# Patient Record
Sex: Male | Born: 1950
Health system: Southern US, Community
[De-identification: ages and names within clinical notes are randomized; demographics above are authoritative.]

## PROBLEM LIST (undated history)

## (undated) DIAGNOSIS — M25522 Pain in left elbow: Secondary | ICD-10-CM

## (undated) DIAGNOSIS — L309 Dermatitis, unspecified: Secondary | ICD-10-CM

## (undated) DIAGNOSIS — R51 Headache: Secondary | ICD-10-CM

## (undated) DIAGNOSIS — Z0000 Encounter for general adult medical examination without abnormal findings: Secondary | ICD-10-CM

## (undated) DIAGNOSIS — I2510 Atherosclerotic heart disease of native coronary artery without angina pectoris: Secondary | ICD-10-CM

## (undated) DIAGNOSIS — R197 Diarrhea, unspecified: Secondary | ICD-10-CM

## (undated) DIAGNOSIS — K219 Gastro-esophageal reflux disease without esophagitis: Secondary | ICD-10-CM

## (undated) DIAGNOSIS — K7031 Alcoholic cirrhosis of liver with ascites: Secondary | ICD-10-CM

## (undated) DIAGNOSIS — E039 Hypothyroidism, unspecified: Secondary | ICD-10-CM

## (undated) DIAGNOSIS — Z86010 Personal history of colonic polyps: Secondary | ICD-10-CM

## (undated) DIAGNOSIS — I10 Essential (primary) hypertension: Secondary | ICD-10-CM

## (undated) DIAGNOSIS — D61818 Other pancytopenia: Secondary | ICD-10-CM

## (undated) DIAGNOSIS — F1020 Alcohol dependence, uncomplicated: Secondary | ICD-10-CM

## (undated) DIAGNOSIS — I700 Atherosclerosis of aorta: Secondary | ICD-10-CM

## (undated) DIAGNOSIS — G4730 Sleep apnea, unspecified: Secondary | ICD-10-CM

## (undated) DIAGNOSIS — R911 Solitary pulmonary nodule: Secondary | ICD-10-CM

## (undated) DIAGNOSIS — H9190 Unspecified hearing loss, unspecified ear: Secondary | ICD-10-CM

## (undated) DIAGNOSIS — I2789 Other specified pulmonary heart diseases: Secondary | ICD-10-CM

## (undated) DIAGNOSIS — I517 Cardiomegaly: Secondary | ICD-10-CM

## (undated) DIAGNOSIS — H9209 Otalgia, unspecified ear: Secondary | ICD-10-CM

## (undated) DIAGNOSIS — J90 Pleural effusion, not elsewhere classified: Secondary | ICD-10-CM

## (undated) DIAGNOSIS — F419 Anxiety disorder, unspecified: Secondary | ICD-10-CM

## (undated) DIAGNOSIS — R0600 Dyspnea, unspecified: Secondary | ICD-10-CM

## (undated) DIAGNOSIS — F1021 Alcohol dependence, in remission: Secondary | ICD-10-CM

## (undated) DIAGNOSIS — Z973 Presence of spectacles and contact lenses: Secondary | ICD-10-CM

## (undated) DIAGNOSIS — E876 Hypokalemia: Secondary | ICD-10-CM

## (undated) DIAGNOSIS — E781 Pure hyperglyceridemia: Secondary | ICD-10-CM

## (undated) DIAGNOSIS — D696 Thrombocytopenia, unspecified: Secondary | ICD-10-CM

## (undated) DIAGNOSIS — R011 Cardiac murmur, unspecified: Secondary | ICD-10-CM

## (undated) DIAGNOSIS — H5789 Other specified disorders of eye and adnexa: Secondary | ICD-10-CM

## (undated) DIAGNOSIS — H6980 Other specified disorders of Eustachian tube, unspecified ear: Secondary | ICD-10-CM

## (undated) DIAGNOSIS — N289 Disorder of kidney and ureter, unspecified: Secondary | ICD-10-CM

## (undated) DIAGNOSIS — I209 Angina pectoris, unspecified: Secondary | ICD-10-CM

## (undated) DIAGNOSIS — I2721 Secondary pulmonary arterial hypertension: Secondary | ICD-10-CM

## (undated) DIAGNOSIS — J449 Chronic obstructive pulmonary disease, unspecified: Secondary | ICD-10-CM

## (undated) DIAGNOSIS — R42 Dizziness and giddiness: Secondary | ICD-10-CM

## (undated) DIAGNOSIS — R161 Splenomegaly, not elsewhere classified: Secondary | ICD-10-CM

## (undated) DIAGNOSIS — I509 Heart failure, unspecified: Secondary | ICD-10-CM

## (undated) DIAGNOSIS — R569 Unspecified convulsions: Secondary | ICD-10-CM

## (undated) DIAGNOSIS — K625 Hemorrhage of anus and rectum: Secondary | ICD-10-CM

## (undated) DIAGNOSIS — D649 Anemia, unspecified: Secondary | ICD-10-CM

## (undated) HISTORY — DX: Other specified pulmonary heart diseases: I27.89

## (undated) HISTORY — DX: Other specified disorders of eye and adnexa: H57.89

## (undated) HISTORY — DX: Unspecified convulsions: R56.9

## (undated) HISTORY — DX: Gastro-esophageal reflux disease without esophagitis: K21.9

## (undated) HISTORY — DX: Encounter for general adult medical examination without abnormal findings: Z00.00

## (undated) HISTORY — DX: Alcohol dependence, uncomplicated: F10.20

## (undated) HISTORY — DX: Otalgia, unspecified ear: H92.09

## (undated) HISTORY — DX: Dermatitis, unspecified: L30.9

## (undated) HISTORY — DX: Pure hyperglyceridemia: E78.1

## (undated) HISTORY — DX: Alcoholic cirrhosis of liver with ascites: K70.31

## (undated) HISTORY — DX: Personal history of colonic polyps: Z86.010

## (undated) HISTORY — DX: Diarrhea, unspecified: R19.7

## (undated) HISTORY — DX: Essential (primary) hypertension: I10

## (undated) HISTORY — DX: Alcohol dependence, in remission: F10.21

## (undated) HISTORY — DX: Hypokalemia: E87.6

## (undated) HISTORY — DX: Pleural effusion, not elsewhere classified: J90

## (undated) HISTORY — DX: Thrombocytopenia, unspecified: D69.6

## (undated) HISTORY — DX: Pain in left elbow: M25.522

## (undated) HISTORY — DX: Hypothyroidism, unspecified: E03.9

## (undated) HISTORY — DX: Disorder of kidney and ureter, unspecified: N28.9

---
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
## 1997-11-03 HISTORY — PX: UVULOPALATOPHARYNGOPLASTY: SHX827

## 2011-03-01 ENCOUNTER — Emergency Department (HOSPITAL_COMMUNITY)

## 2011-03-01 ENCOUNTER — Emergency Department (HOSPITAL_COMMUNITY)
Admission: EM | Admit: 2011-03-01 | Discharge: 2011-03-01 | Disposition: A | Discharge status: Home / Self Care | Attending: Emergency Medicine | Admitting: Emergency Medicine

## 2011-03-01 DIAGNOSIS — F29 Unspecified psychosis not due to a substance or known physiological condition: Secondary | ICD-10-CM | POA: Insufficient documentation

## 2011-03-01 DIAGNOSIS — I10 Essential (primary) hypertension: Secondary | ICD-10-CM | POA: Insufficient documentation

## 2011-03-01 DIAGNOSIS — Z79899 Other long term (current) drug therapy: Secondary | ICD-10-CM | POA: Insufficient documentation

## 2011-03-01 DIAGNOSIS — R55 Syncope and collapse: Secondary | ICD-10-CM | POA: Insufficient documentation

## 2011-03-01 DIAGNOSIS — R569 Unspecified convulsions: Secondary | ICD-10-CM | POA: Insufficient documentation

## 2011-03-01 LAB — RAPID URINE DRUG SCREEN, HOSP PERFORMED
Amphetamines: NOT DETECTED
Benzodiazepines: NOT DETECTED

## 2011-03-01 LAB — URINALYSIS, ROUTINE W REFLEX MICROSCOPIC
Nitrite: NEGATIVE
Specific Gravity, Urine: 1.02 (ref 1.005–1.030)
Urobilinogen, UA: 1 mg/dL (ref 0.0–1.0)

## 2011-03-01 LAB — CBC
Hemoglobin: 14.1 g/dL (ref 13.0–17.0)
MCH: 38.4 pg — ABNORMAL HIGH (ref 26.0–34.0)
MCHC: 35.4 g/dL (ref 30.0–36.0)

## 2011-03-01 LAB — COMPREHENSIVE METABOLIC PANEL
Albumin: 3.7 g/dL (ref 3.5–5.2)
BUN: 12 mg/dL (ref 6–23)
CO2: 21 mEq/L (ref 19–32)
Chloride: 102 mEq/L (ref 96–112)
Creatinine, Ser: 1.19 mg/dL (ref 0.4–1.5)
GFR calc non Af Amer: >60 mL/min (ref >60)
Total Bilirubin: 1.1 mg/dL (ref 0.3–1.2)

## 2011-03-01 LAB — DIFFERENTIAL
Basophils Relative: 0 % (ref 0–1)
Eosinophils Relative: 1 % (ref 0–5)
Monocytes Absolute: 0.5 10*3/uL (ref 0.1–1.0)
Monocytes Relative: 11 % (ref 3–12)
Neutro Abs: 1.6 10*3/uL — ABNORMAL LOW (ref 1.7–7.7)

## 2011-03-01 LAB — URINE MICROSCOPIC-ADD ON

## 2011-03-01 LAB — ETHANOL: Alcohol, Ethyl (B): 23 mg/dL — ABNORMAL HIGH (ref 0–10)

## 2011-03-01 LAB — POCT CARDIAC MARKERS: Myoglobin, poc: 126 ng/mL (ref 12–200)

## 2011-03-21 ENCOUNTER — Ambulatory Visit (INDEPENDENT_AMBULATORY_CARE_PROVIDER_SITE_OTHER): Admitting: Internal Medicine

## 2011-03-21 ENCOUNTER — Encounter: Payer: Self-pay | Admitting: Internal Medicine

## 2011-03-21 DIAGNOSIS — T2000XA Burn of unspecified degree of head, face, and neck, unspecified site, initial encounter: Secondary | ICD-10-CM

## 2011-03-21 DIAGNOSIS — F1020 Alcohol dependence, uncomplicated: Secondary | ICD-10-CM

## 2011-03-21 DIAGNOSIS — R209 Unspecified disturbances of skin sensation: Secondary | ICD-10-CM

## 2011-03-21 DIAGNOSIS — I10 Essential (primary) hypertension: Secondary | ICD-10-CM

## 2011-03-21 DIAGNOSIS — G40909 Epilepsy, unspecified, not intractable, without status epilepticus: Secondary | ICD-10-CM

## 2011-03-21 MED ORDER — METHYLPREDNISOLONE (PAK) 4 MG PO TABS: ORAL_TABLET | ORAL | Status: AC

## 2011-03-21 MED ORDER — SILVER SULFADIAZINE 1 % EX CREA: TOPICAL_CREAM | CUTANEOUS | Status: DC

## 2011-03-22 ENCOUNTER — Encounter: Payer: Self-pay | Admitting: Internal Medicine

## 2011-03-22 DIAGNOSIS — T2000XA Burn of unspecified degree of head, face, and neck, unspecified site, initial encounter: Secondary | ICD-10-CM | POA: Insufficient documentation

## 2011-03-22 DIAGNOSIS — F1021 Alcohol dependence, in remission: Secondary | ICD-10-CM

## 2011-03-22 DIAGNOSIS — F1020 Alcohol dependence, uncomplicated: Secondary | ICD-10-CM | POA: Insufficient documentation

## 2011-03-22 DIAGNOSIS — I10 Essential (primary) hypertension: Secondary | ICD-10-CM | POA: Insufficient documentation

## 2011-03-22 HISTORY — DX: Alcohol dependence, in remission: F10.21

## 2011-03-22 NOTE — Assessment & Plan Note (Signed)
Counseled regarding need for weaning and ultimate cessation. Reviewed potential morbidity associated with continued alcohol use. Reviewed hospital labs including likely associated decreased platelet count and elevated liver function tests. Consider repeat CBC with B12 folate and liver function tests with next visit.

## 2011-03-22 NOTE — Assessment & Plan Note (Signed)
Currently asymptomatic. Undergoing neurology evaluation. Recently placed on anticonvulsant.Alcohol weaning recommended
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
## 2011-03-22 NOTE — Assessment & Plan Note (Signed)
Begin Silvadene cream to forehead. Medrol Dosepak and cool compresses for edema.  followup in 4 days or sooner if needed.

## 2011-03-22 NOTE — Assessment & Plan Note (Signed)
Average control. Continue current regimen.

## 2011-03-25 ENCOUNTER — Ambulatory Visit (INDEPENDENT_AMBULATORY_CARE_PROVIDER_SITE_OTHER): Admitting: Internal Medicine

## 2011-03-25 VITALS — BP 128/84 | Temp 97.8°F | Wt 214.0 lb

## 2011-03-25 DIAGNOSIS — T2000XA Burn of unspecified degree of head, face, and neck, unspecified site, initial encounter: Secondary | ICD-10-CM

## 2011-03-25 DIAGNOSIS — F1020 Alcohol dependence, uncomplicated: Secondary | ICD-10-CM

## 2011-03-25 DIAGNOSIS — G40909 Epilepsy, unspecified, not intractable, without status epilepticus: Secondary | ICD-10-CM

## 2011-03-25 DIAGNOSIS — G4700 Insomnia, unspecified: Secondary | ICD-10-CM

## 2011-03-25 DIAGNOSIS — R209 Unspecified disturbances of skin sensation: Secondary | ICD-10-CM

## 2011-03-25 DIAGNOSIS — D126 Benign neoplasm of colon, unspecified: Secondary | ICD-10-CM

## 2011-03-25 DIAGNOSIS — K635 Polyp of colon: Secondary | ICD-10-CM

## 2011-03-25 MED ORDER — ESZOPICLONE 2 MG PO TABS: 2.0000 mg | ORAL_TABLET | Freq: Every evening | ORAL | Status: DC | PRN

## 2011-03-26 ENCOUNTER — Encounter: Payer: Self-pay | Admitting: Internal Medicine

## 2011-03-26 DIAGNOSIS — G4700 Insomnia, unspecified: Secondary | ICD-10-CM | POA: Insufficient documentation

## 2011-03-26 DIAGNOSIS — K635 Polyp of colon: Secondary | ICD-10-CM | POA: Insufficient documentation

## 2011-03-26 NOTE — Assessment & Plan Note (Signed)
Patient states history of colon polyps and is apparently due for followup colonoscopy. Has [place] by letter to schedule followup. Asymptomatic. GI referral for followup colonoscopy

## 2011-03-26 NOTE — Assessment & Plan Note (Signed)
Begin Lunesta 2 mg each bedtime when necessary short-term

## 2011-03-26 NOTE — Assessment & Plan Note (Signed)
Again counseled regarding the need for weaning and ultimate cessation of alcohol use. States understanding and agreement. Discussed that next visit with possible physical to obtain B12 level, folate and liver function tests.

## 2011-03-26 NOTE — Assessment & Plan Note (Signed)
Improving. No evidence of secondary infection. Discussed long-term healing,  potential for scarring and difference in pigmentation. Avoid prolonged exposure to sunlight

## 2011-03-26 NOTE — Assessment & Plan Note (Signed)
Asymptomatic without recent seizure activity. Advised to contact neurologist office at soonest opportunity to discuss possible anticonvulsant side effects.
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
## 2011-04-02 ENCOUNTER — Other Ambulatory Visit

## 2011-04-11 ENCOUNTER — Encounter: Admitting: Internal Medicine

## 2011-04-15 ENCOUNTER — Other Ambulatory Visit

## 2011-04-18 ENCOUNTER — Encounter

## 2011-04-22 ENCOUNTER — Encounter: Admitting: Internal Medicine

## 2011-05-02 ENCOUNTER — Other Ambulatory Visit: Admitting: Gastroenterology

## 2011-08-18 ENCOUNTER — Telehealth: Payer: Self-pay | Admitting: Internal Medicine

## 2011-08-18 DIAGNOSIS — Z0000 Encounter for general adult medical examination without abnormal findings: Secondary | ICD-10-CM

## 2011-08-18 NOTE — Telephone Encounter (Signed)
Lab orders have been entered for [facility] for October 2012.

## 2011-08-18 NOTE — Telephone Encounter (Signed)
Patient has cpe with Dr. Elizebeth Koller on 08/28/11. Please order labs. Patient will be going downstairs to [facility] on 08/21/11

## 2011-08-28 ENCOUNTER — Encounter: Payer: Self-pay | Admitting: Internal Medicine

## 2011-08-28 ENCOUNTER — Ambulatory Visit (INDEPENDENT_AMBULATORY_CARE_PROVIDER_SITE_OTHER): Admitting: Internal Medicine

## 2011-08-28 DIAGNOSIS — D126 Benign neoplasm of colon, unspecified: Secondary | ICD-10-CM

## 2011-08-28 DIAGNOSIS — Z125 Encounter for screening for malignant neoplasm of prostate: Secondary | ICD-10-CM

## 2011-08-28 DIAGNOSIS — D7589 Other specified diseases of blood and blood-forming organs: Secondary | ICD-10-CM

## 2011-08-28 DIAGNOSIS — Z1211 Encounter for screening for malignant neoplasm of colon: Secondary | ICD-10-CM

## 2011-08-28 DIAGNOSIS — R1310 Dysphagia, unspecified: Secondary | ICD-10-CM

## 2011-08-28 DIAGNOSIS — G40909 Epilepsy, unspecified, not intractable, without status epilepticus: Secondary | ICD-10-CM

## 2011-08-28 DIAGNOSIS — Z79899 Other long term (current) drug therapy: Secondary | ICD-10-CM

## 2011-08-28 DIAGNOSIS — Z23 Encounter for immunization: Secondary | ICD-10-CM

## 2011-08-28 DIAGNOSIS — H6120 Impacted cerumen, unspecified ear: Secondary | ICD-10-CM

## 2011-08-28 DIAGNOSIS — E876 Hypokalemia: Secondary | ICD-10-CM

## 2011-08-28 DIAGNOSIS — K635 Polyp of colon: Secondary | ICD-10-CM

## 2011-08-28 DIAGNOSIS — I10 Essential (primary) hypertension: Secondary | ICD-10-CM

## 2011-08-28 MED ORDER — VERAPAMIL HCL 180 MG PO TBCR: 180.0000 mg | EXTENDED_RELEASE_TABLET | Freq: Two times a day (BID) | ORAL | Status: DC

## 2011-08-28 MED ORDER — LISINOPRIL 10 MG PO TABS: 10.0000 mg | ORAL_TABLET | Freq: Every day | ORAL | Status: DC

## 2011-08-28 MED ORDER — OMEPRAZOLE 40 MG PO CPDR: 40.0000 mg | DELAYED_RELEASE_CAPSULE | Freq: Every day | ORAL | Status: DC

## 2011-08-28 MED ORDER — ALLOPURINOL 100 MG PO TABS: 100.0000 mg | ORAL_TABLET | Freq: Every day | ORAL | Status: DC

## 2011-08-28 MED ORDER — POTASSIUM CHLORIDE 20 MEQ/15ML (10%) PO LIQD: 20.0000 meq | Freq: Two times a day (BID) | ORAL | Status: DC

## 2011-08-29 DIAGNOSIS — H6120 Impacted cerumen, unspecified ear: Secondary | ICD-10-CM | POA: Insufficient documentation

## 2011-08-29 DIAGNOSIS — R1310 Dysphagia, unspecified: Secondary | ICD-10-CM | POA: Insufficient documentation

## 2011-08-29 DIAGNOSIS — D7589 Other specified diseases of blood and blood-forming organs: Secondary | ICD-10-CM | POA: Insufficient documentation

## 2011-08-29 DIAGNOSIS — E876 Hypokalemia: Secondary | ICD-10-CM | POA: Insufficient documentation

## 2011-08-29 NOTE — Assessment & Plan Note (Signed)
Attempted irrigation only partially successful. Recommend outpt use of debrox gtts at home for several days. If unsuccessful return for re-evaluation

## 2011-08-29 NOTE — Progress Notes (Signed)
  Subjective:    Patient ID: Darren Mueller, male    DOB: [birth year], 60 y.o.   MRN: [id number]  HPI Pt presents to clinic for followup of multiple medical problems. BP reviewed minimally elevated. Last visit was referred for follow up colonoscopy but cancelled appt. Takes kdur and finds the pill difficult to swallow. Also notes generalized dysphagia for several months for solid foods but not liquids.  Some decreased hearing from left ear. Sees neurology for h/o sz d/o and notes side effects from anticonvulsent. Has decreased etoh use to periodic. No other complaints.   reports that he has quit smoking. He has never used smokeless tobacco. He reports that he drinks alcohol. He reports that he does not use illicit drugs. family history is not on file. Allergies  Allergen Reactions  . Aspirin Other (See Comments)    hypertension     Review of Systems see hpi     Objective:   Physical Exam  Nursing note and vitals reviewed. Constitutional: He appears well-developed and well-nourished. No distress.  HENT:  Head: Normocephalic and atraumatic.  Right Ear: External ear normal.  Nose: Nose normal.  Mouth/Throat: Oropharynx is clear and moist. No oropharyngeal exudate.       Left ear canal occluded with cerumen otherwise nl  Eyes: Conjunctivae are normal. No scleral icterus.  Neck: Neck supple.  Cardiovascular: Normal rate, regular rhythm and normal heart sounds.  Exam reveals no gallop and no friction rub.   No murmur heard. Pulmonary/Chest: Effort normal and breath sounds normal. No respiratory distress. He has no wheezes. He has no rales.  Neurological: He is alert.  Skin: Skin is warm and dry. He is not diaphoretic.  Psychiatric: He has a normal mood and affect.          Assessment & Plan:

## 2011-08-29 NOTE — Assessment & Plan Note (Signed)
Minimally suboptimal. Continue current regimen but recommend outpt bp log to be submitted for review. Obtain cbc, chem7 and tsh

## 2011-08-29 NOTE — Assessment & Plan Note (Signed)
Reschedule colonoscopy

## 2011-08-29 NOTE — Assessment & Plan Note (Signed)
Obtain b12 and folate

## 2011-08-29 NOTE — Assessment & Plan Note (Signed)
Encouraged follow up with neurology to discuss side effects of medication

## 2011-08-29 NOTE — Assessment & Plan Note (Signed)
Change kdur to liquid. Obtain chem7

## 2011-08-29 NOTE — Assessment & Plan Note (Signed)
Begin omeprazole 50m qd. If sx's do not resolve within ~4wks consider gi consult for consideration of egd

## 2011-09-04 ENCOUNTER — Encounter

## 2011-09-05 ENCOUNTER — Ambulatory Visit (INDEPENDENT_AMBULATORY_CARE_PROVIDER_SITE_OTHER): Admitting: Gastroenterology

## 2011-09-05 ENCOUNTER — Encounter: Payer: Self-pay | Admitting: Gastroenterology

## 2011-09-05 VITALS — BP 152/84 | HR 62 | Ht 73.0 in | Wt 219.0 lb

## 2011-09-05 DIAGNOSIS — R1310 Dysphagia, unspecified: Secondary | ICD-10-CM

## 2011-09-05 DIAGNOSIS — Z86010 Personal history of colonic polyps: Secondary | ICD-10-CM

## 2011-09-05 DIAGNOSIS — K219 Gastro-esophageal reflux disease without esophagitis: Secondary | ICD-10-CM

## 2011-09-05 NOTE — Progress Notes (Signed)
HPI: This is a   very pleasant 60 year old man who I am meeting for the first time today.  Had colonoscopy 1-2 years ago, many polyps taken out (maybe 10 polyp, all removed per patient).  Was told to have repeat in 6 months, hasn't yet.  Recently moved to [place] from [place].  He also has swallowing difficulty.  Some large pills (potassium), meat at times will lodge, requiring regurgitation.  Overall he has gained weight   Retired Curator.  He gets "indigestion" burning in chest with certain meals.  Just started PPI last week and that helps his dysphagia, and burning.  No overt bleeding.    Review of systems: Pertinent positive and negative review of systems were noted in the above HPI section. Complete review of systems was performed and was otherwise normal.    Past Medical History  Diagnosis Date  . Hypertension   . Seizures   . Hx of colonic polyps   . GERD (gastroesophageal reflux disease)   . Gout     No past surgical history on file.  Current Outpatient Prescriptions  Medication Sig Dispense Refill  . allopurinol (ZYLOPRIM) 100 MG tablet Take 1 tablet (100 mg total) by mouth daily.  90 tablet  2  . lacosamide (VIMPAT) 50 MG TABS Take 50 mg by mouth. Takes as directed       . lisinopril (PRINIVIL,ZESTRIL) 10 MG tablet Take 1 tablet (10 mg total) by mouth daily.  90 tablet  2  . omeprazole (PRILOSEC) 40 MG capsule Take 1 capsule (40 mg total) by mouth daily.  30 capsule  1  . potassium chloride 20 MEQ/15ML (10%) solution Take 15 mLs (20 mEq total) by mouth 2 (two) times daily.  900 mL  6  . verapamil (CALAN-SR) 180 MG CR tablet Take 1 tablet (180 mg total) by mouth 2 (two) times daily.  180 tablet  2    Allergies as of 09/05/2011 - Review Complete 09/05/2011  Allergen Reaction Noted  . Aspirin Other (See Comments) 03/21/2011    Family History  Problem Relation Age of Onset  . Heart disease Mother   . Prostate cancer Father   . Colon cancer Neg Hx      History   Social History  . Marital Status: Widowed    Spouse Name: N/A    Number of Children: 3  . Years of Education: N/A   Occupational History  . Retired    Social History Main Topics  . Smoking status: Former Research scientist (life sciences)  . Smokeless tobacco: Never Used  . Alcohol Use: Yes     3-4 cans of beers and 4-5 drinks of vodka  . Drug Use: No  . Sexually Active: Not on file   Other Topics Concern  . Not on file   Social History Narrative   0 caffeine drinks daily        Physical Exam: BP 152/84  Pulse 62  Ht _0  (1.854 m)  Wt 219 lb (99.338 kg)  BMI 28.89 kg/m2 Constitutional: generally well-appearing Psychiatric: alert and oriented x3 Eyes: extraocular movements intact Mouth: oral pharynx moist, no lesions Neck: supple no lymphadenopathy Cardiovascular: heart regular rate and rhythm Lungs: clear to auscultation bilaterally Abdomen: soft, nontender, nondistended, no obvious ascites, no peritoneal signs, normal bowel sounds Extremities: no lower extremity edema bilaterally Skin: no lesions on visible extremities    Assessment and plan: 60 y.o. male with  history of colon polyps, solid intermittent food dysphagia, GERD  We will  get records from his colonoscopy in [place] and we'll schedule him for repeat colonoscopy as appropriate. He has GERD, chronically, this seems to be improving with once daily proton pump inhibitor. He has been given a GERD handout to discuss modifying some lifestyle factors that can contribute. Given his chronic GERD, intermittent dysphagia we will proceed with EGD at the same time as his likely colonoscopy. I see no reason for any further blood tests or imaging studies prior to that.

## 2011-09-05 NOTE — Patient Instructions (Addendum)
GERD handout.  For you especially,  Peppermint, caffeine, alcohol can contribute to GERD. We will get records from your [place] colonoscopy from 1-2 years ago (colonscopy and any associated pathology reports).  Will plan to schedule a repeat colonoscopy after reviewing that information. You will be set up for an upper endoscopy (will wait on the above info before scheduling). Continue prilosec once daily. A copy of this information will be made available to Dr. Elizebeth Koller.  Gastroesophageal reflux disease (GERD) happens when acid from your stomach flows up into the esophagus. When acid comes in contact with the esophagus, the acid causes soreness (inflammation) in the esophagus. Over time, GERD may create small holes (ulcers) in the lining of the esophagus. CAUSES   Increased body weight. This puts pressure on the stomach, making acid rise from the stomach into the esophagus.   Smoking. This increases acid production in the stomach.   Drinking alcohol. This causes decreased pressure in the lower esophageal sphincter (valve or ring of muscle between the esophagus and stomach), allowing acid from the stomach into the esophagus.   Late evening meals and a full stomach. This increases pressure and acid production in the stomach.   A malformed lower esophageal sphincter.  Sometimes, no cause is found. SYMPTOMS   Burning pain in the lower part of the mid-chest behind the breastbone and in the mid-stomach area. This may occur twice a week or more often.   Trouble swallowing.   Sore throat.   Dry cough.   Asthma-like symptoms including chest tightness, shortness of breath, or wheezing.  DIAGNOSIS  Your caregiver may be able to diagnose GERD based on your symptoms. In some cases, X-rays and other tests may be done to check for complications or to check the condition of your stomach and esophagus. TREATMENT  Your caregiver may recommend over-the-counter or prescription medicines to help decrease  acid production. Ask your caregiver before starting or adding any new medicines.  HOME CARE INSTRUCTIONS   Change the factors that you can control. Ask your caregiver for guidance concerning weight loss, quitting smoking, and alcohol consumption.   Avoid foods and drinks that make your symptoms worse, such as:   Caffeine or alcoholic drinks.   Chocolate.   Peppermint or mint flavorings.   Garlic and onions.   Spicy foods.   Citrus fruits, such as oranges, lemons, or limes.   Tomato-based foods such as sauce, chili, salsa, and pizza.   Fried and fatty foods.   Avoid lying down for the 3 hours prior to your bedtime or prior to taking a nap.   Eat small, frequent meals instead of large meals.   Wear loose-fitting clothing. Do not wear anything tight around your waist that causes pressure on your stomach.   Raise the head of your bed 6 to 8 inches with wood blocks to help you sleep. Extra pillows will not help.   Only take over-the-counter or prescription medicines for pain, discomfort, or fever as directed by your caregiver.   Do not take aspirin, ibuprofen, or other nonsteroidal anti-inflammatory drugs (NSAIDs).  SEEK IMMEDIATE MEDICAL CARE IF:   You have pain in your arms, neck, jaw, teeth, or back.   Your pain increases or changes in intensity or duration.   You develop nausea, vomiting, or sweating (diaphoresis).   You develop shortness of breath, or you faint.   Your vomit is green, yellow, black, or looks like coffee grounds or blood.   Your stool is red, bloody, or black.  These symptoms could be signs of other problems, such as heart disease, gastric bleeding, or esophageal bleeding. MAKE SURE YOU:   Understand these instructions.   Will watch your condition.   Will get help right away if you are not doing well or get worse.  Document Released: 07/30/2005 Document Revised: 07/02/2011 Document Reviewed: 05/09/2011 [facility] Patient Information 2012  [place].

## 2011-09-09 ENCOUNTER — Telehealth: Payer: Self-pay | Admitting: Gastroenterology

## 2011-09-09 NOTE — Telephone Encounter (Signed)
Left message on machine to call back  

## 2011-09-10 ENCOUNTER — Telehealth: Payer: Self-pay | Admitting: Gastroenterology

## 2011-09-10 NOTE — Telephone Encounter (Signed)
Records received see alternate note

## 2011-09-10 NOTE — Telephone Encounter (Signed)
Colonoscopy 12/10: "Two sessile polyps were found in hepatic flexure (8-40m), piecemeal resected and then tatoo'd with INigerInk" path showed "moderate to severe dysplasia" and he was recommended to have repeat examination at 3 month interval.  Please call him.  [place]

## 2011-09-11 NOTE — Telephone Encounter (Signed)
Pt transferred to [facility] to schedule colon endo

## 2011-09-12 ENCOUNTER — Other Ambulatory Visit: Admitting: Gastroenterology

## 2011-09-16 ENCOUNTER — Encounter: Payer: Self-pay | Admitting: Gastroenterology

## 2011-09-22 ENCOUNTER — Ambulatory Visit (AMBULATORY_SURGERY_CENTER): Admitting: *Deleted

## 2011-09-22 VITALS — Ht 73.0 in | Wt 221.3 lb

## 2011-09-22 DIAGNOSIS — R1310 Dysphagia, unspecified: Secondary | ICD-10-CM

## 2011-09-22 DIAGNOSIS — K219 Gastro-esophageal reflux disease without esophagitis: Secondary | ICD-10-CM

## 2011-09-22 DIAGNOSIS — Z1211 Encounter for screening for malignant neoplasm of colon: Secondary | ICD-10-CM

## 2011-09-22 MED ORDER — PEG-KCL-NACL-NASULF-NA ASC-C 100 G PO SOLR: ORAL | Status: DC

## 2011-09-30 ENCOUNTER — Telehealth: Payer: Self-pay | Admitting: Internal Medicine

## 2011-09-30 MED ORDER — OMEPRAZOLE 40 MG PO CPDR: 40.0000 mg | DELAYED_RELEASE_CAPSULE | Freq: Every day | ORAL | Status: DC

## 2011-09-30 NOTE — Telephone Encounter (Signed)
Refill-omeprazole dr 40 capsule. Take one capsule (46m total) by mouth daily. Qty 30 last fill 11.21.12

## 2011-09-30 NOTE — Telephone Encounter (Signed)
Rx refill sent to pharmacy.

## 2011-10-03 ENCOUNTER — Ambulatory Visit (AMBULATORY_SURGERY_CENTER): Admitting: Gastroenterology

## 2011-10-03 ENCOUNTER — Encounter: Payer: Self-pay | Admitting: Gastroenterology

## 2011-10-03 VITALS — BP 158/77 | HR 92 | Temp 98.7°F | Resp 12 | Ht 73.0 in | Wt 221.0 lb

## 2011-10-03 DIAGNOSIS — R1310 Dysphagia, unspecified: Secondary | ICD-10-CM

## 2011-10-03 DIAGNOSIS — D126 Benign neoplasm of colon, unspecified: Secondary | ICD-10-CM

## 2011-10-03 DIAGNOSIS — Z1211 Encounter for screening for malignant neoplasm of colon: Secondary | ICD-10-CM

## 2011-10-03 DIAGNOSIS — I8500 Esophageal varices without bleeding: Secondary | ICD-10-CM

## 2011-10-03 DIAGNOSIS — K219 Gastro-esophageal reflux disease without esophagitis: Secondary | ICD-10-CM

## 2011-10-03 DIAGNOSIS — Z86010 Personal history of colonic polyps: Secondary | ICD-10-CM

## 2011-10-03 MED ORDER — SODIUM CHLORIDE 0.9 % IV SOLN: 500.0000 mL | INTRAVENOUS | Status: DC

## 2011-10-03 NOTE — Patient Instructions (Signed)
Continue Prilosec EVERYDAY.  Take 30 minutes before 1st meal of the day.  Dr Ardis Hughs office will schedule Ultrasound to check your liver [phone])  4polyps-all removed today.  Dr Ardis Hughs will send you a letter in about 2weeks to explain pathology of polyps.
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
## 2011-10-06 ENCOUNTER — Telehealth: Payer: Self-pay | Admitting: *Deleted

## 2011-10-06 ENCOUNTER — Telehealth: Payer: Self-pay

## 2011-10-06 DIAGNOSIS — R933 Abnormal findings on diagnostic imaging of other parts of digestive tract: Secondary | ICD-10-CM

## 2011-10-06 NOTE — Telephone Encounter (Signed)
No answer at # given.  Message left on voicemail.

## 2011-10-06 NOTE — Telephone Encounter (Signed)
Pt aware and ROV scheduled

## 2011-10-06 NOTE — Telephone Encounter (Addendum)
[place] appt  830 am 10/08/11 arrive 15 minutes early and npo after midnight Left message on machine to call back

## 2011-10-08 ENCOUNTER — Ambulatory Visit (HOSPITAL_COMMUNITY)
Admission: RE | Admit: 2011-10-08 | Discharge: 2011-10-08 | Disposition: A | Discharge status: Home / Self Care | Source: Ambulatory Visit | Attending: Gastroenterology | Admitting: Gastroenterology

## 2011-10-08 DIAGNOSIS — R933 Abnormal findings on diagnostic imaging of other parts of digestive tract: Secondary | ICD-10-CM

## 2011-10-08 DIAGNOSIS — K829 Disease of gallbladder, unspecified: Secondary | ICD-10-CM | POA: Insufficient documentation

## 2011-10-10 ENCOUNTER — Other Ambulatory Visit (INDEPENDENT_AMBULATORY_CARE_PROVIDER_SITE_OTHER)

## 2011-10-10 ENCOUNTER — Ambulatory Visit (INDEPENDENT_AMBULATORY_CARE_PROVIDER_SITE_OTHER): Admitting: Gastroenterology

## 2011-10-10 ENCOUNTER — Encounter: Payer: Self-pay | Admitting: Gastroenterology

## 2011-10-10 VITALS — BP 142/84 | HR 88 | Ht 73.0 in | Wt 217.8 lb

## 2011-10-10 DIAGNOSIS — I8500 Esophageal varices without bleeding: Secondary | ICD-10-CM

## 2011-10-10 LAB — CBC WITH DIFFERENTIAL/PLATELET
Basophils Absolute: 0 10*3/uL (ref 0.0–0.1)
Eosinophils Absolute: 0.1 10*3/uL (ref 0.0–0.7)
HCT: 43.3 % (ref 39.0–52.0)
Lymphs Abs: 1.7 10*3/uL (ref 0.7–4.0)
MCV: 107 fl — ABNORMAL HIGH (ref 78.0–100.0)
Monocytes Absolute: 0.5 10*3/uL (ref 0.1–1.0)
Platelets: 82 10*3/uL — ABNORMAL LOW (ref 150.0–400.0)
RDW: 13.4 % (ref 11.5–14.6)

## 2011-10-10 LAB — COMPREHENSIVE METABOLIC PANEL
ALT: 40 U/L (ref 0–53)
Alkaline Phosphatase: 156 U/L — ABNORMAL HIGH (ref 39–117)
CO2: 25 mEq/L (ref 19–32)
GFR: 65.57 mL/min (ref >60.00)
Sodium: 139 mEq/L (ref 135–145)
Total Bilirubin: 1.2 mg/dL (ref 0.3–1.2)
Total Protein: 8.5 g/dL — ABNORMAL HIGH (ref 6.0–8.3)

## 2011-10-10 LAB — PROTIME-INR: Prothrombin Time: 12.7 s — ABNORMAL HIGH (ref 10.2–12.4)

## 2011-10-10 LAB — IBC PANEL: Saturation Ratios: 43.5 % (ref 20.0–50.0)

## 2011-10-10 LAB — IGA: IgA: 601 mg/dL — ABNORMAL HIGH (ref 68–378)

## 2011-10-10 NOTE — Progress Notes (Signed)
Review of pertinent gastrointestinal problems: 1. history of adenomatous colon polyps : Colonoscopy 12/10: "Two sessile polyps were found in hepatic flexure (8-26m), piecemeal resected and then tatoo'd with INigerInk" path showed "moderate to severe dysplasia" and he was recommended to have repeat examination at 3 month interval.  Colonoscopy December 2012 found 4 polyps, one was piecemeal removed, adenoma. He was recommended to have repeat at 6 month recall. 2. incidentally noted esophageal varices on EGD done for dysphasia.  Abdominal ultrasound showed a normal liver without clear sign of cirrhosis   HPI: This is a very pleasant 60year old man whom I last saw the time of his upper and lower endoscopy. I was surprised to find incidental distal esophagus varices as well as portal gastropathy. We arranged this visit to discuss those findings. In the interim he has had an abdominal ultrasound that showed no clear cirrhosis.  He drinks 3-5 alcoholic beverages a day sometimes more sometimes less.    Past Medical History  Diagnosis Date  . Hypertension   . Seizures   . Hx of colonic polyps   . GERD (gastroesophageal reflux disease)   . Gout     No past surgical history on file.  Current Outpatient Prescriptions  Medication Sig Dispense Refill  . allopurinol (ZYLOPRIM) 100 MG tablet Take 1 tablet (100 mg total) by mouth daily.  90 tablet  2  . lacosamide (VIMPAT) 50 MG TABS Take 50 mg by mouth. Takes as directed       . lisinopril (PRINIVIL,ZESTRIL) 10 MG tablet Take 1 tablet (10 mg total) by mouth daily.  90 tablet  2  . omeprazole (PRILOSEC) 40 MG capsule Take 1 capsule (40 mg total) by mouth daily.  30 capsule  6  . potassium chloride 40 MEQ/15ML (20%) LIQD Take 20 mEq by mouth 2 times daily at 12 noon and 4 pm.        . verapamil (CALAN-SR) 180 MG CR tablet Take 1 tablet (180 mg total) by mouth 2 (two) times daily.  180 tablet  2   Current Facility-Administered Medications    Medication Dose Route Frequency Provider Last Rate Last Dose  . DISCONTD: 0.9 %  sodium chloride infusion  500 mL Intravenous Continuous DOwens Loffler MD        Allergies as of 10/10/2011 - Review Complete 10/10/2011  Allergen Reaction Noted  . Aspirin Other (See Comments) 03/21/2011    Family History  Problem Relation Age of Onset  . Heart disease Mother   . Prostate cancer Father   . Colon cancer Neg Hx     History   Social History  . Marital Status: Widowed    Spouse Name: N/A    Number of Children: 3  . Years of Education: N/A   Occupational History  . Retired    Social History Main Topics  . Smoking status: Former Smoker    Quit date: 09/22/1979  . Smokeless tobacco: Never Used  . Alcohol Use: Yes     3-4 cans of beers and 4-5 drinks of vodka  . Drug Use: No  . Sexually Active: Not on file   Other Topics Concern  . Not on file   Social History Narrative   0 caffeine drinks daily       Physical Exam: BP 142/84  Pulse 88  Ht _0  (1.854 m)  Wt 217 lb 12.8 oz (98.793 kg)  BMI 28.74 kg/m2 Constitutional: generally well-appearing Psychiatric: alert and oriented x3 Abdomen: soft, nontender, nondistended,  no obvious ascites, no peritoneal signs, normal bowel sounds No lower extremity edema Anicteric sclera    Assessment and plan: 60 y.o. male with adenomatous polyps, incidentally noted esophageal varices  I suspect he has alcoholic liver disease. I cannot clearly say that he has cirrhosis however. I reviewed some labs done several months ago showing AST of 50 or 60, normal ALT. His platelets were 100,000. He is going to get a repeat set of blood work today, see those tests summarized below. I made it very clear to him that I think probably alcohol is causing this problem and that he should cut back as best as he can. He is a very intelligent, pleasant man. He will return to see me in 2 months to touch base.

## 2011-10-10 NOTE — Patient Instructions (Signed)
You will get labs drawn today:  Hepatitis A (IgM and IgG), Hepatitis B surface antigen, Hepatitis B surface antibody, Hepatitis C antibody, total iron, ferritin, TIBC, TTG, total IgA level, CBC, CMET, INR. You need to cut back on your alcohol consumption, it has clearly irritated your liver (cannot definitely say if you have cirrhosis or not).  Return to see Dr. Ardis Hughs in 2 months.

## 2011-10-11 LAB — HEPATITIS A ANTIBODY, TOTAL: Hep A Total Ab: POSITIVE — AB

## 2011-10-11 LAB — HEPATITIS B SURFACE ANTIBODY,QUALITATIVE: Hep B S Ab: NEGATIVE

## 2011-10-15 ENCOUNTER — Telehealth: Payer: Self-pay | Admitting: Gastroenterology

## 2011-10-15 NOTE — Telephone Encounter (Signed)
See result note dated 10/15/11

## 2011-10-23 ENCOUNTER — Telehealth: Payer: Self-pay | Admitting: Internal Medicine

## 2011-10-23 NOTE — Telephone Encounter (Signed)
Call placed to [facility] pharmacy at [phone] ;spoke with Larkin Ina.  He was informed a Rx was sent in for Omeprazole in November. He has verified Rx on file for patient. There are no refills needed at this time.

## 2011-10-23 NOTE — Telephone Encounter (Signed)
Refill- omeprazole dr 7m capsule. Take one capsule (447mtotal) by mouth daily. Qty 30 last fill 11.21.12

## 2011-10-31 ENCOUNTER — Other Ambulatory Visit: Payer: Self-pay | Admitting: Internal Medicine

## 2011-10-31 NOTE — Telephone Encounter (Signed)
Received refill request again for omeprazole. Spoke to [place] at the pharmacy and he states that pt has refills on file and he will contact pt re: refill request.

## 2011-11-26 ENCOUNTER — Ambulatory Visit: Admitting: Internal Medicine

## 2011-12-03 ENCOUNTER — Telehealth: Payer: Self-pay | Admitting: *Deleted

## 2011-12-03 ENCOUNTER — Ambulatory Visit: Admitting: Internal Medicine

## 2011-12-03 MED ORDER — VERAPAMIL HCL ER 180 MG PO CP24: 180.0000 mg | ORAL_CAPSULE | Freq: Two times a day (BID) | ORAL | Status: DC

## 2011-12-03 NOTE — Telephone Encounter (Signed)
Pt called stating he has always gotten capsule form of Verapamil in the past. Recent rx was in tablet form and pt cancelled rx from mail order. Pt is requesting that we send rx for capsules. The only capsule form I could see in EPIC is for verelan 24hr. Pt is supposed to be taking 1 twice a day. Is this ok to change to capsule form?

## 2011-12-03 NOTE — Telephone Encounter (Signed)
Rx refill sent to pharmacy per patient request, per Dr Elizebeth Koller ok.

## 2011-12-03 NOTE — Telephone Encounter (Signed)
ok 

## 2011-12-08 ENCOUNTER — Encounter: Payer: Self-pay | Admitting: Gastroenterology

## 2012-01-22 ENCOUNTER — Telehealth: Payer: Self-pay | Admitting: Internal Medicine

## 2012-01-22 MED ORDER — POTASSIUM CHLORIDE 40 MEQ/15ML (20%) PO LIQD: 20.0000 meq | Freq: Two times a day (BID) | ORAL | Status: DC

## 2012-01-22 MED ORDER — DOXEPIN HCL 6 MG PO TABS: 6.0000 mg | ORAL_TABLET | Freq: Every day | ORAL | Status: DC

## 2012-01-22 MED ORDER — OMEPRAZOLE 40 MG PO CPDR: 40.0000 mg | DELAYED_RELEASE_CAPSULE | Freq: Every day | ORAL | Status: DC

## 2012-01-22 NOTE — Telephone Encounter (Signed)
Omeprazole dr 40 mg capsule. 90 days supply  silenor 37m tablet. 90 days supply.   Potassium cl 10% (20MEQ/15ML). 90 days supply.

## 2012-01-22 NOTE — Telephone Encounter (Signed)
Call placed to patient at [phone], no answer. A detailed voice message was left for patient to return phone call regarding medication refills for [facility]. Patient is past due for fasting blood work that was scheduled in October, and a follow up appointment is needed with Dr Elizebeth Koller.

## 2012-01-22 NOTE — Telephone Encounter (Signed)
Patient returned phone call and left voice message stating he was returning phone call,and he could be reached at [phone]. Call returned to patient at requested phone number, no answer. Voice recording reached stating the mailbox is full and can not accept messages at this time, please try your call again later.

## 2012-01-22 NOTE — Telephone Encounter (Signed)
Patient returned phone call he,was asked to confirm if he is taking Silenor. He states that he is taking that for sleep. He was informed medication would be sent to pharmacy, and advised to schedule a follow up with Dr Elizebeth Koller within 3 months. Patient has verbalized understanding and agrees as instructed.  Rx refills sent to pharmacy.

## 2012-01-27 ENCOUNTER — Encounter: Payer: Self-pay | Admitting: Gastroenterology

## 2012-01-27 ENCOUNTER — Telehealth: Payer: Self-pay | Admitting: Internal Medicine

## 2012-01-27 NOTE — Telephone Encounter (Signed)
Patient last seen in October of 2012. Appointment for January was cancelled; there are no future appointments on file.

## 2012-01-27 NOTE — Telephone Encounter (Signed)
Don't viagra on med list or med history. Certainly can talk about at next appt

## 2012-01-27 NOTE — Telephone Encounter (Signed)
Rx refill denial sent to [facility]; office visit needed.

## 2012-01-27 NOTE — Telephone Encounter (Signed)
New prescription request for Viagra  Viagra 2m tablet. 90 days supply.

## 2012-01-28 ENCOUNTER — Telehealth: Payer: Self-pay | Admitting: Internal Medicine

## 2012-01-28 MED ORDER — VERAPAMIL HCL ER 180 MG PO CP24: 180.0000 mg | ORAL_CAPSULE | Freq: Two times a day (BID) | ORAL | Status: DC

## 2012-01-28 NOTE — Telephone Encounter (Signed)
Rx refill sent to pharmacy. Patient due for office visit

## 2012-01-28 NOTE — Telephone Encounter (Signed)
New prescription request  Verapamil ER 162m capsule.

## 2012-05-11 ENCOUNTER — Encounter: Payer: Self-pay | Admitting: *Deleted

## 2012-05-11 ENCOUNTER — Telehealth: Payer: Self-pay | Admitting: Internal Medicine

## 2012-05-11 MED ORDER — DOXEPIN HCL 6 MG PO TABS: 6.0000 mg | ORAL_TABLET | Freq: Every day | ORAL | Status: DC

## 2012-05-11 MED ORDER — OMEPRAZOLE 40 MG PO CPDR: 40.0000 mg | DELAYED_RELEASE_CAPSULE | Freq: Every day | ORAL | Status: DC

## 2012-05-11 MED ORDER — VERAPAMIL HCL ER 180 MG PO CP24: 180.0000 mg | ORAL_CAPSULE | Freq: Two times a day (BID) | ORAL | Status: DC

## 2012-05-11 MED ORDER — ALLOPURINOL 100 MG PO TABS: 100.0000 mg | ORAL_TABLET | Freq: Every day | ORAL | Status: DC

## 2012-05-11 NOTE — Telephone Encounter (Signed)
New prescription request  Omeprazole dr 75m capsule. 90 days supply. Refills 4  silenor 688mtablet.  90 days supply.  Allopurinol 10011mablet.  90 days supply.  Verapamil er 180m87mpsule.  90 days supply. Refills 4

## 2012-05-11 NOTE — Telephone Encounter (Signed)
Last OV 08-28-11, Allopurinol last filled 08-28-11 #90 2, last filled silenor 01-22-12 #90.Please advise

## 2012-05-11 NOTE — Telephone Encounter (Signed)
[place] for 90d supplies. Needs f/u appt. Last seen 08/2011

## 2012-05-11 NOTE — Telephone Encounter (Signed)
Rx sent, Letter Mail

## 2012-07-10 ENCOUNTER — Other Ambulatory Visit: Payer: Self-pay | Admitting: Internal Medicine

## 2012-07-12 NOTE — Telephone Encounter (Signed)
Rx[s] request; pt last OV 10.25.12 [due back in 01.2013]: Silenor: Hx in EMR, D/C 11.02.12; Omeprazole: last Rx 07.09.13 #90x0 Letter mailed 07.09.13 informing pt follow-up OV needed for medication refills/SLS DENIAL ON REQUEST.

## 2012-07-31 ENCOUNTER — Other Ambulatory Visit: Payer: Self-pay | Admitting: Internal Medicine

## 2012-08-03 NOTE — Telephone Encounter (Signed)
Pt was due for follow up in January. Refill of allopurinol was sent to pharmacy in July with note that pt was due for office visit. No follow up has been scheduled. Spoke with pt and notified him of need for follow up. Appt scheduled for 08/30/12 at 10:00am with Dr Elizebeth Koller. Pt verified that he still uses [facility] mail order. Refill sent, #90 x no refills.

## 2012-08-24 ENCOUNTER — Encounter: Payer: Self-pay | Admitting: Gastroenterology

## 2012-08-30 ENCOUNTER — Ambulatory Visit: Admitting: Internal Medicine

## 2012-10-03 ENCOUNTER — Other Ambulatory Visit: Payer: Self-pay | Admitting: Internal Medicine

## 2012-10-04 NOTE — Telephone Encounter (Signed)
DENIED-Pt last OV 10.25.12; pt was notified via Letter that appointment was needed when given refill authorizations 07.09.13, proceeded to [facility] 10.28.13 OV]/SLS

## 2012-11-01 ENCOUNTER — Ambulatory Visit (INDEPENDENT_AMBULATORY_CARE_PROVIDER_SITE_OTHER): Admitting: Internal Medicine

## 2012-11-01 ENCOUNTER — Encounter: Payer: Self-pay | Admitting: Internal Medicine

## 2012-11-01 VITALS — BP 128/92 | HR 88 | Temp 97.6°F | Resp 16 | Ht 72.0 in | Wt 200.0 lb

## 2012-11-01 DIAGNOSIS — J40 Bronchitis, not specified as acute or chronic: Secondary | ICD-10-CM

## 2012-11-01 DIAGNOSIS — IMO0001 Reserved for inherently not codable concepts without codable children: Secondary | ICD-10-CM | POA: Insufficient documentation

## 2012-11-01 DIAGNOSIS — I10 Essential (primary) hypertension: Secondary | ICD-10-CM

## 2012-11-01 DIAGNOSIS — Z125 Encounter for screening for malignant neoplasm of prostate: Secondary | ICD-10-CM

## 2012-11-01 DIAGNOSIS — F1020 Alcohol dependence, uncomplicated: Secondary | ICD-10-CM

## 2012-11-01 DIAGNOSIS — Z79899 Other long term (current) drug therapy: Secondary | ICD-10-CM

## 2012-11-01 LAB — TSH: TSH: 4.192 u[IU]/mL (ref 0.350–4.500)

## 2012-11-01 MED ORDER — OMEPRAZOLE 40 MG PO CPDR: 40.0000 mg | DELAYED_RELEASE_CAPSULE | Freq: Every day | ORAL | Status: DC

## 2012-11-01 MED ORDER — AZITHROMYCIN 250 MG PO TABS: ORAL_TABLET | ORAL | Status: AC

## 2012-11-01 NOTE — Progress Notes (Signed)
  Subjective:    Patient ID: Darren Mueller, male    DOB: 1951/05/28, 61 y.o.   MRN: [id number]  HPI Pt presents to clinic for followup of multiple medical problems. Last visit 08/2011. Notes 1.5 wk h/o nasal congestion and productive cough. Not taking any medication for the problem. Weight down 13 lbs since last year unintentionally. H/o seizure d/o and self dc'ed anticonvulsant ~3 months ago due to side effects. Denies any seizure activity. Aware of potential for further sz's and has not followed up with neurology. Continues to drink etoh regularly ~ 2-3 drinks a day five days a week on average. Denies shakes, tremors or legal problems related to etoh. Has had egd in the past demonstrating esophageal varices and subsequent abd [place] did not demonstrate obvious evidence of cirrhosis.   Past Medical History  Diagnosis Date  . Hypertension   . Seizures   . Hx of colonic polyps   . GERD (gastroesophageal reflux disease)   . Gout    No past surgical history on file.  reports that he quit smoking about 33 years ago. He has never used smokeless tobacco. He reports that he drinks alcohol. He reports that he does not use illicit drugs. family history includes Heart disease in his mother and Prostate cancer in his father.  There is no history of Colon cancer. Allergies  Allergen Reactions  . Aspirin Other (See Comments)    hypertension      Review of Systems see hpi     Objective:   Physical Exam  Physical Exam  Nursing note and vitals reviewed. Constitutional: Appears well-developed and well-nourished. No distress.  HENT:  Head: Normocephalic and atraumatic.  Right Ear: External ear normal.  Left Ear: External ear normal.  Eyes: Conjunctivae are normal. No scleral icterus.  Neck: Neck supple. Carotid bruit is not present.  Cardiovascular: Normal rate, regular rhythm and normal heart sounds.  Exam reveals no gallop and no friction rub.   No murmur heard. Pulmonary/Chest: Effort normal  and breath sounds normal. No respiratory distress. He has no wheezes. no rales.  Lymphadenopathy:    He has no cervical adenopathy.  Neurological:Alert.  Skin: Skin is warm and dry. Not diaphoretic.  Psychiatric: Has a normal mood and affect.        Assessment & Plan:

## 2012-11-01 NOTE — Assessment & Plan Note (Signed)
Begin abx tx. Followup if no improvement or worsening.

## 2012-11-01 NOTE — Assessment & Plan Note (Addendum)
Long discussion held regarding the need for weaning and ultimate cessation. Aware of potential contribution to HTN as well as potential damage to liver. States understanding. Obtain LFT

## 2012-11-01 NOTE — Assessment & Plan Note (Signed)
Mildly elevated but also with recent uri. Recommend continuation of current regimen and outpt monitoring of bp.

## 2012-11-02 LAB — CBC WITH DIFFERENTIAL/PLATELET
Basophils Absolute: 0 10*3/uL (ref 0.0–0.1)
Basophils Relative: 1 % (ref 0–1)
HCT: 43.4 % (ref 39.0–52.0)
Hemoglobin: 14.6 g/dL (ref 13.0–17.0)
Lymphocytes Relative: 53 % — ABNORMAL HIGH (ref 12–46)
MCHC: 33.6 g/dL (ref 30.0–36.0)
Monocytes Absolute: 0.6 10*3/uL (ref 0.1–1.0)
Monocytes Relative: 15 % — ABNORMAL HIGH (ref 3–12)
Neutro Abs: 1.2 10*3/uL — ABNORMAL LOW (ref 1.7–7.7)
Neutrophils Relative %: 29 % — ABNORMAL LOW (ref 43–77)
WBC: 4 10*3/uL (ref 4.0–10.5)

## 2012-11-02 LAB — HEPATIC FUNCTION PANEL
ALT: 24 U/L (ref 0–53)
AST: 46 U/L — ABNORMAL HIGH (ref 0–37)
Alkaline Phosphatase: 204 U/L — ABNORMAL HIGH (ref 39–117)
Bilirubin, Direct: 0.6 mg/dL — ABNORMAL HIGH (ref 0.0–0.3)
Total Bilirubin: 1.6 mg/dL — ABNORMAL HIGH (ref 0.3–1.2)

## 2012-11-02 LAB — BASIC METABOLIC PANEL
Chloride: 102 mEq/L (ref 96–112)
Glucose, Bld: 82 mg/dL (ref 70–99)
Potassium: 3.8 mEq/L (ref 3.5–5.3)
Sodium: 141 mEq/L (ref 135–145)

## 2012-11-02 LAB — LIPID PANEL
Total CHOL/HDL Ratio: 2.8 Ratio
VLDL: 18 mg/dL (ref 0–40)

## 2012-11-02 LAB — PSA: PSA: 0.49 ng/mL (ref <=4.00)

## 2012-11-03 DIAGNOSIS — R911 Solitary pulmonary nodule: Secondary | ICD-10-CM

## 2012-11-03 DIAGNOSIS — J90 Pleural effusion, not elsewhere classified: Secondary | ICD-10-CM

## 2012-11-03 DIAGNOSIS — K7031 Alcoholic cirrhosis of liver with ascites: Secondary | ICD-10-CM

## 2012-11-03 HISTORY — DX: Alcoholic cirrhosis of liver with ascites: K70.31

## 2012-11-03 HISTORY — DX: Pleural effusion, not elsewhere classified: J90

## 2012-11-03 HISTORY — DX: Solitary pulmonary nodule: R91.1

## 2012-11-05 ENCOUNTER — Telehealth: Payer: Self-pay | Admitting: *Deleted

## 2012-11-05 DIAGNOSIS — R945 Abnormal results of liver function studies: Secondary | ICD-10-CM

## 2012-11-05 NOTE — Telephone Encounter (Signed)
Patient informed, understood & agreed; future lab orders placed/SLS

## 2012-11-05 NOTE — Telephone Encounter (Signed)
Message copied by Rockwell [place] on Fri Nov 05, 2012  5:03 PM ------      Message from: Burnice Logan      Created: Wed Nov 03, 2012 12:30 PM       Liver tests high and rising. Stop/wean alcohol to off as discussed in clinic. Repeat lft in one month dx abn lft.

## 2012-11-09 ENCOUNTER — Telehealth: Payer: Self-pay | Admitting: Internal Medicine

## 2012-11-09 MED ORDER — LISINOPRIL 10 MG PO TABS: 10.0000 mg | ORAL_TABLET | Freq: Every day | ORAL | Status: DC

## 2012-11-09 NOTE — Telephone Encounter (Signed)
Pt needs re-evaluation in the office please. Could be pneumonia.

## 2012-11-09 NOTE — Telephone Encounter (Signed)
Notified pt and he scheduled appt for tomorrow at 9:15am. Per verbal from Provider, recommended Delsym OTC for cough until he is seen in the office. Lisinopril refill sent to pharmacy.

## 2012-11-09 NOTE — Telephone Encounter (Signed)
Pt reports he was seen in the office on 11/01/12 and diagnosed with Bronchitis.  Pt states he completed the Zpak as prescribed but cough is not better, it is worse.  Pt is requesting medication for cough because he is unable to sleep. Pt denies any fever or any symptoms beside cough.    Pt is also requesting a refill of Lisinopril.  Pt uses [facility] off [address].

## 2012-11-09 NOTE — Telephone Encounter (Signed)
Please advise.

## 2012-11-10 ENCOUNTER — Encounter: Payer: Self-pay | Admitting: Internal Medicine

## 2012-11-10 ENCOUNTER — Ambulatory Visit (HOSPITAL_BASED_OUTPATIENT_CLINIC_OR_DEPARTMENT_OTHER)
Admission: RE | Admit: 2012-11-10 | Discharge: 2012-11-10 | Disposition: A | Discharge status: Home / Self Care | Source: Ambulatory Visit | Attending: Internal Medicine | Admitting: Internal Medicine

## 2012-11-10 ENCOUNTER — Ambulatory Visit (INDEPENDENT_AMBULATORY_CARE_PROVIDER_SITE_OTHER): Admitting: Internal Medicine

## 2012-11-10 VITALS — BP 130/92 | HR 96 | Temp 97.6°F | Resp 16 | Wt 195.8 lb

## 2012-11-10 DIAGNOSIS — R05 Cough: Secondary | ICD-10-CM

## 2012-11-10 DIAGNOSIS — J40 Bronchitis, not specified as acute or chronic: Secondary | ICD-10-CM

## 2012-11-10 DIAGNOSIS — R059 Cough, unspecified: Secondary | ICD-10-CM | POA: Insufficient documentation

## 2012-11-10 DIAGNOSIS — J90 Pleural effusion, not elsewhere classified: Secondary | ICD-10-CM | POA: Insufficient documentation

## 2012-11-10 MED ORDER — HYDROCOD POLST-CHLORPHEN POLST 10-8 MG/5ML PO LQCR: 5.0000 mL | Freq: Two times a day (BID) | ORAL | Status: DC | PRN

## 2012-11-10 MED ORDER — LEVOFLOXACIN 500 MG PO TABS: 500.0000 mg | ORAL_TABLET | Freq: Every day | ORAL | Status: DC

## 2012-11-10 NOTE — Progress Notes (Signed)
  Subjective:    Patient ID: Darren Mueller, male    DOB: November 18, 1950, 62 y.o.   MRN: [id number]  HPI Pt presents to clinic for evaluation of persistent cough. Recently seen in f/u and noted cough. tx'ed with zpak without improvement. Now notes cough productive for green sputum without blood. Has intermittent dizziness without presyncope or syncope. Denies f/c, dyspnea or wheezing. Reviewed elevated LFT with strong recommendation to wean etoh use to off and repeat LFT in a few weeks.   Past Medical History  Diagnosis Date  . Hypertension   . Seizures   . Hx of colonic polyps   . GERD (gastroesophageal reflux disease)   . Gout    No past surgical history on file.  reports that he quit smoking about 33 years ago. He has never used smokeless tobacco. He reports that he drinks alcohol. He reports that he does not use illicit drugs. family history includes Heart disease in his mother and Prostate cancer in his father.  There is no history of Colon cancer. Allergies  Allergen Reactions  . Aspirin Other (See Comments)    hypertension     Review of Systems see hpi     Objective:   Physical Exam  Nursing note and vitals reviewed. Constitutional: He appears well-developed and well-nourished. No distress.  HENT:  Head: Normocephalic and atraumatic.  Eyes: Conjunctivae normal are normal. No scleral icterus.  Neck: Neck supple.  Cardiovascular: Normal rate, regular rhythm and normal heart sounds.   Pulmonary/Chest: Effort normal and breath sounds normal. No respiratory distress. He has no wheezes. He has no rales.  Neurological: He is alert.  Skin: Skin is warm and dry. He is not diaphoretic.  Psychiatric: He has a normal mood and affect.          Assessment & Plan:

## 2012-11-10 NOTE — Assessment & Plan Note (Signed)
Persistent sx's s/p abx course. Obtain cxr pa/lat. Begin levaquin x 7days. Attempt tussionex prn-cautioned re possible sedating effect. Followup if no improvement or worsening.

## 2012-11-23 ENCOUNTER — Telehealth: Payer: Self-pay | Admitting: Internal Medicine

## 2012-11-23 NOTE — Telephone Encounter (Signed)
Sometimes a persistent cough is partially reflux, recommend he start Rantidine/Zantac 150 mg po qhs (OTC) if no improvement in a week then needs to come in for repeat imaging since he did have a small pleural effusion secondary to his infection. Return sooner if symptoms are actually worsening

## 2012-11-23 NOTE — Telephone Encounter (Signed)
Pt informed and voiced understanding

## 2012-11-23 NOTE — Telephone Encounter (Signed)
Patient states that he was seen on 11/10/12 for a cough. He says that his cough has not gotten any better since this visit and would like to know what Dr. Charlett Blake recommends he do?

## 2012-11-23 NOTE — Telephone Encounter (Signed)
Please advise?

## 2012-11-23 NOTE — Telephone Encounter (Signed)
Patient also left me a message stating that his cough is wheezy, still a little tired, and ears "clogged" up

## 2012-11-25 ENCOUNTER — Telehealth: Payer: Self-pay

## 2012-11-25 ENCOUNTER — Telehealth: Payer: Self-pay | Admitting: Internal Medicine

## 2012-11-25 NOTE — Telephone Encounter (Signed)
Pt states that his cough is getting worse, ears are clogged, and when he got up to go to the bathroom he got short of breath. I informed pt that the MD is out of the office until Monday and I would send the message to [facility] to get an answer tomorrow but if he is having sob he needs to go to ED. Pt stated that the sob was from laying on the couch for 3 days and he's better now. I informed pt that someone would get back with him tomorrow but if the sob continues he needs to go to ED. Pt voiced understanding and said he was just concerned about his cough getting worse. Please advise?

## 2012-11-25 NOTE — Telephone Encounter (Signed)
Pt would like to switch to Dr todd. Dr Sherren Mocha sees his dad. Can I sch?

## 2012-11-26 ENCOUNTER — Ambulatory Visit (INDEPENDENT_AMBULATORY_CARE_PROVIDER_SITE_OTHER): Admitting: Family

## 2012-11-26 ENCOUNTER — Ambulatory Visit (HOSPITAL_BASED_OUTPATIENT_CLINIC_OR_DEPARTMENT_OTHER)
Admission: RE | Admit: 2012-11-26 | Discharge: 2012-11-26 | Disposition: A | Discharge status: Home / Self Care | Source: Ambulatory Visit | Attending: Family | Admitting: Family

## 2012-11-26 ENCOUNTER — Telehealth: Payer: Self-pay | Admitting: Family

## 2012-11-26 ENCOUNTER — Encounter: Payer: Self-pay | Admitting: Family

## 2012-11-26 VITALS — BP 142/100 | HR 96 | Temp 97.4°F | Resp 16 | Wt 191.1 lb

## 2012-11-26 DIAGNOSIS — R059 Cough, unspecified: Secondary | ICD-10-CM | POA: Insufficient documentation

## 2012-11-26 DIAGNOSIS — R05 Cough: Secondary | ICD-10-CM | POA: Insufficient documentation

## 2012-11-26 DIAGNOSIS — R0602 Shortness of breath: Secondary | ICD-10-CM

## 2012-11-26 DIAGNOSIS — K7460 Unspecified cirrhosis of liver: Secondary | ICD-10-CM | POA: Insufficient documentation

## 2012-11-26 DIAGNOSIS — R945 Abnormal results of liver function studies: Secondary | ICD-10-CM

## 2012-11-26 DIAGNOSIS — F1020 Alcohol dependence, uncomplicated: Secondary | ICD-10-CM

## 2012-11-26 DIAGNOSIS — R7989 Other specified abnormal findings of blood chemistry: Secondary | ICD-10-CM

## 2012-11-26 DIAGNOSIS — J90 Pleural effusion, not elsewhere classified: Secondary | ICD-10-CM | POA: Insufficient documentation

## 2012-11-26 DIAGNOSIS — E8770 Fluid overload, unspecified: Secondary | ICD-10-CM

## 2012-11-26 LAB — HEPATIC FUNCTION PANEL
Albumin: 3.9 g/dL (ref 3.5–5.2)
Indirect Bilirubin: 1.1 mg/dL — ABNORMAL HIGH (ref 0.0–0.9)
Total Protein: 7.1 g/dL (ref 6.0–8.3)

## 2012-11-26 LAB — CBC WITH DIFFERENTIAL/PLATELET
Basophils Absolute: 0 10*3/uL (ref 0.0–0.1)
Eosinophils Relative: 2 % (ref 0–5)
Lymphocytes Relative: 33 % (ref 12–46)
MCV: 102.1 fL — ABNORMAL HIGH (ref 78.0–100.0)
Platelets: 84 10*3/uL — ABNORMAL LOW (ref 150–400)
RDW: 14.5 % (ref 11.5–15.5)
WBC: 3.1 10*3/uL — ABNORMAL LOW (ref 4.0–10.5)

## 2012-11-26 MED ORDER — FUROSEMIDE 40 MG PO TABS: 40.0000 mg | ORAL_TABLET | Freq: Every day | ORAL | Status: DC

## 2012-11-26 NOTE — Telephone Encounter (Signed)
Pls call patient and let him know that I would like to see him in the office-  I have an opening today at 4pm.  If symptoms severe, he should go to the ED instead,

## 2012-11-26 NOTE — Patient Instructions (Addendum)
Please complete your blood work prior to leaving. Complete chest x-ray on the first floor. Follow up in 1 week.  Go to ER if you develop worsening shortness of breath, chest pain, or fever >101.

## 2012-11-26 NOTE — Assessment & Plan Note (Signed)
Cough has worsened.  On lung exam, significant diminished breath sounds on right. Suspect enlarging RLL effusion.  Will obtain follow up chest xray.  Further recommendations pending cxr results.

## 2012-11-26 NOTE — Assessment & Plan Note (Signed)
Will repeat LFT today.

## 2012-11-26 NOTE — Telephone Encounter (Signed)
Pt reports that he did not go to the ER last night as his shortness of breath improved. Reports that he took Robitussin and symptoms seem better today. Scheduled pt appt with [place] today at 4pm for further evaluation and advised pt if sob returns and becomes severe that he should be evaluated in the ER.

## 2012-11-26 NOTE — Assessment & Plan Note (Signed)
He reports that he has not drank alcohol since starting his abx.  I applauded him for this.

## 2012-11-26 NOTE — Telephone Encounter (Signed)
Reviewed chest x ray. Plan lasix, 2decho, follow up in 1 week. Pt notified, verbalizes understanding.

## 2012-11-26 NOTE — Progress Notes (Signed)
Subjective:    Patient ID: Darren Mueller, male    DOB: 02-May-1951, 62 y.o.   MRN: [id number]  HPI  Ms. Zeimet is a 62 yr old male who presents today with chief complaint of shortness of breath.  Chart is reviewed. He was initially seen on 12/30 and diagnosed with bronchitis. He was treated at that time with zithromax.  Symptoms did not improve. He was seen back in the office on 1/8 and a cxr was performed which noted small right pleural effusion.  He was given rx for levaquin x 7 days.  He completed Rx for levaquin.  He reports that shortness of breath started on Wednesday and is worse with exertion. SOB is not made worse by laying flat. He denies associated fever. He does report associated cough which is productive at times of green phlegm.  He reports clear nasal drainage.  He also reports some difficulty hearing out of the right ear.   Review of Systems See HPI  Past Medical History  Diagnosis Date  . Hypertension   . Seizures   . Hx of colonic polyps   . GERD (gastroesophageal reflux disease)   . Gout     History   Social History  . Marital Status: Widowed    Spouse Name: N/A    Number of Children: 3  . Years of Education: N/A   Occupational History  . Retired    Social History Main Topics  . Smoking status: Former Smoker    Quit date: 09/22/1979  . Smokeless tobacco: Never Used  . Alcohol Use: Yes     Comment: 3-4 cans of beers and 4-5 drinks of vodka  . Drug Use: No  . Sexually Active: Not on file   Other Topics Concern  . Not on file   Social History Narrative   0 caffeine drinks daily     No past surgical history on file.  Family History  Problem Relation Age of Onset  . Heart disease Mother   . Prostate cancer Father   . Colon cancer Neg Hx     Allergies  Allergen Reactions  . Aspirin Other (See Comments)    hypertension    Current Outpatient Prescriptions on File Prior to Visit  Medication Sig Dispense Refill  . allopurinol (ZYLOPRIM) 100  MG tablet TAKE 1 TABLET DAILY (OFFICE VISIT DUE)  90 tablet  0  . chlorpheniramine-HYDROcodone (TUSSIONEX PENNKINETIC ER) 10-8 MG/5ML LQCR Take 5 mLs by mouth every 12 (twelve) hours as needed.  140 mL  0  . lacosamide (VIMPAT) 50 MG TABS Take 50 mg by mouth. Takes as directed       . lisinopril (PRINIVIL,ZESTRIL) 10 MG tablet Take 1 tablet (10 mg total) by mouth daily.  90 tablet  1  . omeprazole (PRILOSEC) 40 MG capsule Take 1 capsule (40 mg total) by mouth daily.  90 capsule  3  . potassium chloride 40 MEQ/15ML (20%) LIQD Take 7.5 mLs (20 mEq total) by mouth 2 times daily at 12 noon and 4 pm.  900 mL  0  . verapamil (VERELAN) 180 MG 24 hr capsule Take 1 capsule (180 mg total) by mouth 2 (two) times daily.  180 capsule  0  . levofloxacin (LEVAQUIN) 500 MG tablet Take 1 tablet (500 mg total) by mouth daily.  7 tablet  0    BP 142/100  Pulse 96  Temp 97.4 F (36.3 C) (Oral)  Resp 16  Wt 191 lb 1.3 oz (86.673 kg)  SpO2 94%       Objective:   Physical Exam  Constitutional: He is oriented to person, place, and time. He appears well-developed and well-nourished. No distress.       Cough noted.  HENT:  Head: Normocephalic and atraumatic.  Right Ear: Tympanic membrane and ear canal normal.  Left Ear: Tympanic membrane and ear canal normal.  Mouth/Throat: No oropharyngeal exudate, posterior oropharyngeal edema or posterior oropharyngeal erythema.  Eyes: Scleral icterus is present.  Cardiovascular: Normal rate and regular rhythm.   No murmur heard. Pulmonary/Chest: Effort normal. No respiratory distress. He has decreased breath sounds in the right lower field. He has no wheezes. He has no rhonchi. He has no rales.  Musculoskeletal: He exhibits no edema.  Neurological: He is alert and oriented to person, place, and time.  Skin: Skin is warm and dry.  Psychiatric: He has a normal mood and affect. His behavior is normal. Judgment and thought content normal.          Assessment &  Plan:

## 2012-11-28 ENCOUNTER — Telehealth: Payer: Self-pay | Admitting: Family

## 2012-11-28 DIAGNOSIS — R945 Abnormal results of liver function studies: Secondary | ICD-10-CM

## 2012-11-28 NOTE — Telephone Encounter (Signed)
Pls call pt and let him know that his liver function testing looks slightly worse than last draw.  I would like to arrange a follow up appointment with Dr. Ardis Hughs.  Pended below.

## 2012-11-29 ENCOUNTER — Telehealth: Payer: Self-pay | Admitting: Internal Medicine

## 2012-11-29 NOTE — Telephone Encounter (Signed)
Patient states that he was seen last Friday for a cough. He says that he has been taking Robitussen and his cough is now worse. He would like to know what Melissa recommends he do?

## 2012-11-29 NOTE — Telephone Encounter (Signed)
Pt is sch for 11-30-2012

## 2012-11-29 NOTE — Telephone Encounter (Signed)
Notified pt, he is agreeable to proceed with referral.

## 2012-11-29 NOTE — Telephone Encounter (Signed)
Pt states he has increased post nasal drip, productive cough with green phlegm, having trouble sleeping at night. Pt is still taking lasix. He is out of tussionex.  Please advise.

## 2012-11-29 NOTE — Telephone Encounter (Signed)
ok 

## 2012-11-30 ENCOUNTER — Ambulatory Visit

## 2012-11-30 ENCOUNTER — Encounter: Payer: Self-pay | Admitting: Family Medicine

## 2012-11-30 ENCOUNTER — Ambulatory Visit (INDEPENDENT_AMBULATORY_CARE_PROVIDER_SITE_OTHER)
Admission: RE | Admit: 2012-11-30 | Discharge: 2012-11-30 | Disposition: A | Discharge status: Home / Self Care | Source: Ambulatory Visit | Attending: Critical Care Medicine | Admitting: Critical Care Medicine

## 2012-11-30 ENCOUNTER — Ambulatory Visit (INDEPENDENT_AMBULATORY_CARE_PROVIDER_SITE_OTHER): Admitting: Critical Care Medicine

## 2012-11-30 ENCOUNTER — Encounter: Payer: Self-pay | Admitting: Critical Care Medicine

## 2012-11-30 ENCOUNTER — Ambulatory Visit (INDEPENDENT_AMBULATORY_CARE_PROVIDER_SITE_OTHER): Admitting: Family Medicine

## 2012-11-30 VITALS — BP 140/90 | HR 89 | Temp 97.6°F | Ht 73.0 in | Wt 188.2 lb

## 2012-11-30 VITALS — BP 150/98 | Temp 97.4°F | Wt 188.0 lb

## 2012-11-30 DIAGNOSIS — J90 Pleural effusion, not elsewhere classified: Secondary | ICD-10-CM

## 2012-11-30 DIAGNOSIS — J40 Bronchitis, not specified as acute or chronic: Secondary | ICD-10-CM

## 2012-11-30 DIAGNOSIS — R7989 Other specified abnormal findings of blood chemistry: Secondary | ICD-10-CM

## 2012-11-30 DIAGNOSIS — R945 Abnormal results of liver function studies: Secondary | ICD-10-CM

## 2012-11-30 DIAGNOSIS — R05 Cough: Secondary | ICD-10-CM

## 2012-11-30 DIAGNOSIS — K7030 Alcoholic cirrhosis of liver without ascites: Secondary | ICD-10-CM

## 2012-11-30 DIAGNOSIS — J9811 Atelectasis: Secondary | ICD-10-CM

## 2012-11-30 DIAGNOSIS — J9819 Other pulmonary collapse: Secondary | ICD-10-CM

## 2012-11-30 DIAGNOSIS — K7010 Alcoholic hepatitis without ascites: Secondary | ICD-10-CM

## 2012-11-30 DIAGNOSIS — R059 Cough, unspecified: Secondary | ICD-10-CM

## 2012-11-30 LAB — PROTIME-INR: INR: 1.2 (ref <1.50)

## 2012-11-30 MED ORDER — HYDROCODONE-HOMATROPINE 5-1.5 MG/5ML PO SYRP: 5.0000 mL | ORAL_SOLUTION | Freq: Four times a day (QID) | ORAL | Status: DC | PRN

## 2012-11-30 MED ORDER — LOSARTAN POTASSIUM 50 MG PO TABS: 50.0000 mg | ORAL_TABLET | Freq: Every day | ORAL | Status: DC

## 2012-11-30 MED ORDER — FLUTICASONE PROPIONATE 50 MCG/ACT NA SUSP: 2.0000 | Freq: Every day | NASAL | Status: DC

## 2012-11-30 MED ORDER — HYDROCOD POLST-CHLORPHEN POLST 10-8 MG/5ML PO LQCR: 5.0000 mL | Freq: Two times a day (BID) | ORAL | Status: DC | PRN

## 2012-11-30 NOTE — Patient Instructions (Addendum)
Stop lisinopril Start losartan Hycodan 5ML every 4-6 hours as needed for cough A Bronchoscopy will be scheduled for 9AM on 12/03/12 at [facility]. Nothing by midnight the night before and then arrive by 8AM to admitting office A CT Chest will be scheduled Labs today Return one week

## 2012-11-30 NOTE — Patient Instructions (Signed)
Go to the main office today at 2:15 to see Dr. Fraser Din right pulmonologist

## 2012-11-30 NOTE — Assessment & Plan Note (Signed)
Cirrhotic liver disease d/t ETOH use, R pleural effusion Plan Tap R effusion under u/s guidance Referral back to GI for eval/treatment of liver dz

## 2012-11-30 NOTE — Progress Notes (Signed)
Subjective:    Patient ID: Darren Mueller, male    DOB: 1951-03-16, 62 y.o.   MRN: [id number]  HPI Comments: Cough chronically , started 11/10/12.  20# weight loss over first of the year. Cough is dry  Cough This is a new problem. The current episode started 1 to 4 weeks ago. The problem has been gradually worsening. The cough is non-productive. Associated symptoms include chest pain, chills, postnasal drip, rhinorrhea, shortness of breath and weight loss. Pertinent negatives include no ear congestion, ear pain, fever, headaches, heartburn, hemoptysis, myalgias, nasal congestion, rash, sore throat, sweats or wheezing. Associated symptoms comments: Dyspnea at rest and exertion Pain is tightness, more on the R side. The symptoms are aggravated by lying down. Treatments tried: ABX. The treatment provided no relief. There is no history of asthma, bronchiectasis, bronchitis, COPD, emphysema, environmental allergies or pneumonia.    Past Medical History  Diagnosis Date  . Hypertension   . Seizures   . Hx of colonic polyps   . GERD (gastroesophageal reflux disease)   . Gout      Family History  Problem Relation Age of Onset  . Heart disease Mother   . Prostate cancer Father   . Colon cancer Neg Hx   . Heart attack Mother   . Hypertension Mother      History   Social History  . Marital Status: Widowed    Spouse Name: N/A    Number of Children: 3  . Years of Education: N/A   Occupational History  . Retired     Social research officer, government   Social History Main Topics  . Smoking status: Former Smoker -- 2.0 packs/day for 5 years    Types: Cigarettes    Quit date: 09/22/1979  . Smokeless tobacco: Never Used  . Alcohol Use: Yes     Comment: 3-4 cans of beers and 4-5 drinks of vodka  . Drug Use: No  . Sexually Active: Not on file   Other Topics Concern  . Not on file   Social History Narrative   0 caffeine drinks daily      Allergies  Allergen Reactions  . Aspirin Other (See Comments)   hypertension     Outpatient Prescriptions Prior to Visit  Medication Sig Dispense Refill  . allopurinol (ZYLOPRIM) 100 MG tablet TAKE 1 TABLET DAILY (OFFICE VISIT DUE)  90 tablet  0  . furosemide (LASIX) 40 MG tablet Take 1 tablet (40 mg total) by mouth daily.  30 tablet  0  . lacosamide (VIMPAT) 50 MG TABS Take 50 mg by mouth. Takes as directed       . omeprazole (PRILOSEC) 40 MG capsule Take 1 capsule (40 mg total) by mouth daily.  90 capsule  3  . verapamil (VERELAN) 180 MG 24 hr capsule Take 1 capsule (180 mg total) by mouth 2 (two) times daily.  180 capsule  0  . [DISCONTINUED] lisinopril (PRINIVIL,ZESTRIL) 10 MG tablet Take 1 tablet (10 mg total) by mouth daily.  90 tablet  1  . [DISCONTINUED] potassium chloride 40 MEQ/15ML (20%) LIQD Take 7.5 mLs (20 mEq total) by mouth 2 times daily at 12 noon and 4 pm.  900 mL  0  . fluticasone (FLONASE) 50 MCG/ACT nasal spray Place 2 sprays into the nose daily.  16 g  0  . [DISCONTINUED] levofloxacin (LEVAQUIN) 500 MG tablet Take 1 tablet (500 mg total) by mouth daily.  7 tablet  0   Last reviewed on 11/30/2012  2:16 PM  by Elsie Stain, MD    Review of Systems  Constitutional: Positive for chills, weight loss, appetite change, fatigue and unexpected weight change. Negative for fever, diaphoresis and activity change.       20# weight loss  HENT: Positive for congestion, rhinorrhea and postnasal drip. Negative for hearing loss, ear pain, nosebleeds, sore throat, facial swelling, sneezing, mouth sores, trouble swallowing, neck pain, neck stiffness, dental problem, voice change, sinus pressure, tinnitus and ear discharge.   Eyes: Negative for photophobia, discharge, itching and visual disturbance.  Respiratory: Positive for cough and shortness of breath. Negative for apnea, hemoptysis, choking, chest tightness, wheezing and stridor.        Qhs cough  Cardiovascular: Positive for chest pain. Negative for palpitations and leg swelling.    Gastrointestinal: Negative for heartburn, nausea, vomiting, abdominal pain, constipation, blood in stool and abdominal distention.  Genitourinary: Negative for dysuria, urgency, frequency, hematuria, flank pain, decreased urine volume and difficulty urinating.  Musculoskeletal: Negative for myalgias, back pain, joint swelling, arthralgias and gait problem.  Skin: Negative for color change, pallor and rash.  Neurological: Negative for dizziness, tremors, seizures, syncope, speech difficulty, weakness, light-headedness, numbness and headaches.  Hematological: Negative for environmental allergies and adenopathy. Does not bruise/bleed easily.  Psychiatric/Behavioral: Negative for confusion, sleep disturbance and agitation. The patient is not nervous/anxious.        Objective:   Physical Exam Filed Vitals:   11/30/12 1404  BP: 140/90  Pulse: 89  Temp: 97.6 F (36.4 C)  TempSrc: Oral  Height: _0  (1.854 m)  Weight: 188 lb 3.2 oz (85.367 kg)  SpO2: 96%    Gen: Pleasant, well-nourished, in no distress,  normal affect  ENT: No lesions,  mouth clear,  oropharynx clear, no postnasal drip  Neck: No JVD, no TMG, no carotid bruits  Lungs: No use of accessory muscles, no dullness to percussion,dexreased bs 1/2 way up R and dull to percussion same area  Cardiovascular: RRR, heart sounds normal, no murmur or gallops, no peripheral edema  Abdomen: soft and NT, no HSM,  BS normal,  Liver nodular , +++ascites  Musculoskeletal: No deformities, no cyanosis or clubbing  Neuro: alert, non focal  Skin: Warm, no lesions or rashes  Ct Chest Wo Contrast  11/30/2012  *RADIOLOGY REPORT*  Clinical Data: Cough, pleural effusion, weight loss  CT CHEST WITHOUT CONTRAST  Technique:  Multidetector CT imaging of the chest was performed following the standard protocol without IV contrast.  Comparison: Prior chest x-ray 11/26/2012  Findings:  Mediastinum: Unremarkable CT appearance of thyroid gland.  Nonspecific mediastinal lymph nodes with fatty hila are borderline enlarged by CT criteria.  An index right paratracheal node measures up to 11 mm in short axis. Unremarkable appearance of the thoracic esophagus.  Heart/Vascular: Cardiomegaly with right atrial and ventricular enlargement.  There is trace pericardial fluid versus thickening anteriorly.  The main pulmonary artery is enlarged at 3.5 cm in diameter as can be seen with pulmonary arterial hypertension. Atherosclerotic calcifications are identified in the transverse aorta and within the coronary arteries.  No aneurysmal dilatation. Lungs/Pleura:  The largest simple layering right-sided pleural effusion.  No left pleural effusion.  There is associated compressive atelectasis of the right lower lobe.  Trace dependent atelectasis in the left lower lobe.  No focal airspace consolidation or evidence of pulmonary edema. And 8 mm pulmonary nodule is identified in the periphery of the left lower lobe (image 49 of series 3).  Upper Abdomen: Mild perihepatic and perisplenic ascites.  There is a diffuse micronodular contour the hepatic parenchyma suggestive of underlying cirrhosis.  The spleen is incompletely imaged but appears borderline enlarged.  Bones: No acute fracture or aggressive appearing lytic or blastic osseous lesion.  Move to  IMPRESSION:  1.  Moderately large simple layering right pleural effusion may represent a hepatic hydrothorax given the noncontrast CT findings suggestive of underlying hepatic cirrhosis. 2.  Small volume of perihepatic and perisplenic ascites also likely related to underlying cirrhosis.  3.  Diffusely nodular hepatic contour with relative atrophy of the right hepatic lobe concerning for underlying cirrhosis.  Recommend clinical correlation with serum LFTs, and referral to gastroenterology if not already obtained.  4.  An 8 mm pulmonary nodule in the periphery of the left lower lobe warrants additional follow-up imaging.   If the  patient is at high risk for bronchogenic carcinoma, follow-up chest CT at 3-6 months is recommended.  If the patient is at low risk for bronchogenic carcinoma, follow-up chest CT at 6-12 months is recommended.  This recommendation follows the consensus statement: Guidelines for Management of Small Pulmonary Nodules Detected on CT Scans: A Statement from the [place] as published in Radiology 2005; 237:395-400.  These results will be called to the ordering clinician or representative by the Radiologist Assistant, and communication documented in the PACS Dashboard.   Original Report Authenticated By: Jacqulynn Cadet, M.D.           Assessment & Plan:   Abnormal LFTs Cirrhotic liver disease d/t ETOH use, R pleural effusion Plan Tap R effusion under u/s guidance Referral back to GI for eval/treatment of liver dz   Bronchitis Cyclical cough d/t bronchitis , ? Airway lesion with ATX RLL, exaerbated by ACE use  Plan Consider FOB to r/o endobronch lesion D/c ace inhibitor and change to ARB d/t cough    Pleural effusion on right Pleural effusion is d/t cirrhrotic liver disesae   Updated Medication List Outpatient Encounter Prescriptions as of 11/30/2012  Medication Sig Dispense Refill  . allopurinol (ZYLOPRIM) 100 MG tablet TAKE 1 TABLET DAILY (OFFICE VISIT DUE)  90 tablet  0  . furosemide (LASIX) 40 MG tablet Take 1 tablet (40 mg total) by mouth daily.  30 tablet  0  . guaiFENesin (ROBITUSSIN) 100 MG/5ML liquid Take 200 mg by mouth as needed.      . lacosamide (VIMPAT) 50 MG TABS Take 50 mg by mouth. Takes as directed       . omeprazole (PRILOSEC) 40 MG capsule Take 1 capsule (40 mg total) by mouth daily.  90 capsule  3  . potassium chloride 40 MEQ/15ML (20%) LIQD Take 20 mEq by mouth daily.      . verapamil (VERELAN) 180 MG 24 hr capsule Take 1 capsule (180 mg total) by mouth 2 (two) times daily.  180 capsule  0  . [DISCONTINUED] lisinopril (PRINIVIL,ZESTRIL) 10 MG tablet Take  1 tablet (10 mg total) by mouth daily.  90 tablet  1  . [DISCONTINUED] potassium chloride 40 MEQ/15ML (20%) LIQD Take 7.5 mLs (20 mEq total) by mouth 2 times daily at 12 noon and 4 pm.  900 mL  0  . fluticasone (FLONASE) 50 MCG/ACT nasal spray Place 2 sprays into the nose daily.  16 g  0  . HYDROcodone-homatropine (HYCODAN) 5-1.5 MG/5ML syrup Take 5 mLs by mouth every 6 (six) hours as needed for cough.  240 mL  0  . losartan (COZAAR) 50 MG tablet Take 1 tablet (50 mg total) by  mouth daily.  30 tablet  6  . [DISCONTINUED] levofloxacin (LEVAQUIN) 500 MG tablet Take 1 tablet (500 mg total) by mouth daily.  7 tablet  0

## 2012-11-30 NOTE — Assessment & Plan Note (Addendum)
Pleural effusion is d/t cirrhrotic liver disesae Plan Thoracentesis of R pleural space and send for appropriate studies

## 2012-11-30 NOTE — Telephone Encounter (Signed)
OK to refill tussionex.  Start flonase to help with nasal drainage. He should complete levaquin, let us know if phlegm/ nasal drainage is not improved by the time he completes levaquin.

## 2012-11-30 NOTE — Progress Notes (Signed)
  Subjective:    Patient ID: Darren Mueller, male    DOB: [birth year], 62 y.o.   MRN: [id number]  HPI Darren Mueller is a 62 year old male nonsmoker who comes in today to see me as a new patient,,,,,,,,,,i  care for his mother father brothers and sisters,,,,,,,,,,,, for a second opinion regarding a cough for 2 months  He's had a cough for 2 months no sputum production no fever but weight loss. No previous history of pulmonary problems he's a nonsmoker. He was seen at the [facility] office and was given a prescription for cough syrup and Levaquin. He took the medication but it didn't help. He had initial chest x-ray that showed a right pleural effusion on January 8 and a followup chest x-ray January 24 which showed a persistence of the effusion.   Review of Systems Pulmonary review of systems otherwise negative    Objective:   Physical Exam Well-developed well-nourished male no acute distress HEENT were negative neck was supple no adenopathy lungs shows symmetrical breath sounds crackles right base  Chest x-rays as noted above         Assessment & Plan:  Right pleural effusion, cough x2 months, 20 pounds of weight loss,,,,,,,,,, pulmonary consult today at 2:15 with Dr. Joya Mueller

## 2012-11-30 NOTE — Telephone Encounter (Signed)
Per verbal from Provider, disregard notation about levaquin, proceed with other instructions and encourage pt to keep appt with Dr Sherren Mocha today. Notified pt and he voices understanding. Tussionex called to [facility].

## 2012-11-30 NOTE — Assessment & Plan Note (Signed)
Cyclical cough d/t bronchitis , ? Airway lesion with ATX RLL, exaerbated by ACE use  Plan Consider FOB to r/o endobronch lesion D/c ace inhibitor and change to ARB d/t cough

## 2012-12-01 ENCOUNTER — Telehealth: Payer: Self-pay | Admitting: Critical Care Medicine

## 2012-12-01 NOTE — Telephone Encounter (Signed)
I tried to call pt re: Ct Chest result. He needs a thoracentesis.  I ordered this, I put in labs and thora u/s order for [facility].  pls confirm this was done correctly Also I will call and CANCEL Bronch for Friday. We need to tell the pt we have CANCELLED his Fob.  Want to do thora first.  Also needs GI referral for liver dz I put that order in place as well.

## 2012-12-01 NOTE — Telephone Encounter (Signed)
Called, spoke with Darren Mueller with [facility].  Was advised thora [place] order is in correctly and labs are in correctly as well.  She did read the labs that were needed to me.  Was advised that if there was a problem for some reason with the way the labs were put in, they would call back tomorrow to have this fixed, but for now, everything looks correct and nothing further is needed.

## 2012-12-01 NOTE — Addendum Note (Signed)
Addended by: Elsie Stain on: 12/01/2012 09:57 AM   Modules accepted: Orders

## 2012-12-01 NOTE — Telephone Encounter (Signed)
I was able to speak to the pt and tell him fob cancelled He knows about thoracentesis

## 2012-12-02 ENCOUNTER — Ambulatory Visit (HOSPITAL_COMMUNITY)
Admission: RE | Admit: 2012-12-02 | Discharge: 2012-12-02 | Disposition: A | Discharge status: Home / Self Care | Source: Ambulatory Visit | Attending: Radiology | Admitting: Radiology

## 2012-12-02 ENCOUNTER — Ambulatory Visit (HOSPITAL_COMMUNITY)
Admission: RE | Admit: 2012-12-02 | Discharge: 2012-12-02 | Disposition: A | Discharge status: Home / Self Care | Source: Ambulatory Visit | Attending: Critical Care Medicine | Admitting: Critical Care Medicine

## 2012-12-02 DIAGNOSIS — R05 Cough: Secondary | ICD-10-CM | POA: Insufficient documentation

## 2012-12-02 DIAGNOSIS — R059 Cough, unspecified: Secondary | ICD-10-CM | POA: Insufficient documentation

## 2012-12-02 DIAGNOSIS — J90 Pleural effusion, not elsewhere classified: Secondary | ICD-10-CM | POA: Insufficient documentation

## 2012-12-02 DIAGNOSIS — K7460 Unspecified cirrhosis of liver: Secondary | ICD-10-CM | POA: Insufficient documentation

## 2012-12-02 LAB — BODY FLUID CELL COUNT WITH DIFFERENTIAL
Eos, Fluid: 0 %
Lymphs, Fluid: 92 %
Monocyte-Macrophage-Serous Fluid: 8 % — ABNORMAL LOW (ref 50–90)
Neutrophil Count, Fluid: 0 % (ref 0–25)
Total Nucleated Cell Count, Fluid: 3150 cu mm — ABNORMAL HIGH (ref 0–1000)

## 2012-12-02 LAB — GLUCOSE, SEROUS FLUID: Glucose, Fluid: 102 mg/dL

## 2012-12-02 LAB — PROTEIN, BODY FLUID: Total protein, fluid: 4.7 g/dL

## 2012-12-02 NOTE — Procedures (Signed)
US guided diagnostic/therapeutic right thoracentesis performed yielding 1.3 liters slightly turbid, yellow fluid. The fluid was sent to the lab for preordered studies. F/u CXR pending. No immediate complications.

## 2012-12-03 ENCOUNTER — Other Ambulatory Visit (INDEPENDENT_AMBULATORY_CARE_PROVIDER_SITE_OTHER)

## 2012-12-03 ENCOUNTER — Encounter (HOSPITAL_COMMUNITY): Payer: Self-pay

## 2012-12-03 ENCOUNTER — Encounter (HOSPITAL_COMMUNITY)

## 2012-12-03 ENCOUNTER — Ambulatory Visit (HOSPITAL_COMMUNITY): Admit: 2012-12-03 | Payer: Self-pay | Admitting: Critical Care Medicine

## 2012-12-03 ENCOUNTER — Encounter: Payer: Self-pay | Admitting: Gastroenterology

## 2012-12-03 ENCOUNTER — Ambulatory Visit (INDEPENDENT_AMBULATORY_CARE_PROVIDER_SITE_OTHER): Admitting: Gastroenterology

## 2012-12-03 VITALS — BP 118/79 | HR 66 | Ht 73.0 in | Wt 182.2 lb

## 2012-12-03 DIAGNOSIS — K7460 Unspecified cirrhosis of liver: Secondary | ICD-10-CM

## 2012-12-03 LAB — BASIC METABOLIC PANEL
BUN: 23 mg/dL (ref 6–23)
CO2: 24 mEq/L (ref 19–32)
Calcium: 8.7 mg/dL (ref 8.4–10.5)
Creatinine, Ser: 1.9 mg/dL — ABNORMAL HIGH (ref 0.4–1.5)
Glucose, Bld: 97 mg/dL (ref 70–99)

## 2012-12-03 SURGERY — BRONCHOSCOPY, WITH FLUOROSCOPY
Anesthesia: Moderate Sedation

## 2012-12-03 MED ORDER — HEPATITIS B VAC RECOMBINANT 5 MCG/0.5ML IJ SUSP: 0.5000 mL | Freq: Once | INTRAMUSCULAR | Status: DC

## 2012-12-03 NOTE — Progress Notes (Signed)
Review of pertinent gastrointestinal problems:  1. history of adenomatous colon polyps : Colonoscopy 12/10: "Two sessile polyps were found in hepatic flexure (8-26m), piecemeal resected and then tatoo'd with INigerInk" path showed "moderate to severe dysplasia" and he was recommended to have repeat examination at 3 month interval. Colonoscopy December 2012 found 4 polyps, one was piecemeal removed, adenoma. He was recommended to have repeat at 6 month recall.  2. incidentally noted esophageal varices on 2012  EGD done for dysphasia. 2012 Abdominal ultrasound showed a normal liver without clear sign of cirrhosis.  Was told to cut back on drinking. 3. 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  From chronic etoh over use.  Labs 12/12 Hep A Ab +, Hep B S ag and Ab negative, Hep C Ab neg; ANA, AIA, AMA, ceruloplasm, iron testing all neg;  Last liver imaging: 12/12 [place] clear cirrhosis, no focal lesions Last AFP:  Last EGD: 2012 small esophageal varices Hep B immunization; not done yet  HPI: This is a  very pleasant 62year old man whom I last saw about a year and a half ago.  At that time I was suspicious for chronic liver damage based on liver tests, presents of small esophageal varices. Ultrasound around that time showed no convincing evidence of cirrhosis however.  Since then he has had shortness of breath, found to have a large right-sided pleural effusion, imaging tests done now show nodular liver consistent with cirrhosis.  1.3 liters pleural removed tapped yesterday. Feeling much better today.  He was started on Lasix 40 mg once daily  4 days ago.  [place] [employer] His last alcohol drink was about 3 weeks ago. Prior to that he was drinking several beers and vodka today.       Past Medical History  Diagnosis Date  . Hypertension   . Seizures   . Hx  of colonic polyps   . GERD (gastroesophageal reflux disease)   . Gout     History reviewed. No pertinent past surgical history.  Current Outpatient Prescriptions  Medication Sig Dispense Refill  . allopurinol (ZYLOPRIM) 100 MG tablet TAKE 1 TABLET DAILY (OFFICE VISIT DUE)  90 tablet  0  . fluticasone (FLONASE) 50 MCG/ACT nasal spray Place 2 sprays into the nose daily.  16 g  0  . furosemide (LASIX) 40 MG tablet Take 1 tablet (40 mg total) by mouth daily.  30 tablet  0  . guaiFENesin (ROBITUSSIN) 100 MG/5ML liquid Take 200 mg by mouth as needed.      .Marland KitchenHYDROcodone-homatropine (HYCODAN) 5-1.5 MG/5ML syrup Take 5 mLs by mouth every 6 (six) hours as needed for cough.  240 mL  0  . lacosamide (VIMPAT) 50 MG TABS Take 50 mg by mouth. Takes as directed       . losartan (COZAAR) 50 MG tablet Take 1 tablet (50 mg total) by mouth daily.  30 tablet  6  . omeprazole (PRILOSEC) 40 MG capsule Take 1 capsule (40 mg total) by mouth daily.  90 capsule  3  . potassium chloride 40 MEQ/15ML (20%) LIQD Take 20 mEq by mouth daily.      . verapamil (VERELAN) 180 MG 24 hr capsule Take 1 capsule (180 mg total) by mouth 2 (two) times daily.  180 capsule  0    Allergies as of 12/03/2012 - Review  Complete 12/03/2012  Allergen Reaction Noted  . Aspirin Other (See Comments) 03/21/2011    Family History  Problem Relation Age of Onset  . Heart disease Mother   . Prostate cancer Father   . Colon cancer Neg Hx   . Heart attack Mother   . Hypertension Mother     History   Social History  . Marital Status: Widowed    Spouse Name: N/A    Number of Children: 3  . Years of Education: N/A   Occupational History  . Retired     Social research officer, government   Social History Main Topics  . Smoking status: Former Smoker -- 2.0 packs/day for 5 years    Types: Cigarettes    Quit date: 09/22/1979  . Smokeless tobacco: Never Used  . Alcohol Use: Yes     Comment: 3-4 cans of beers and 4-5 drinks of vodka - has not drunk any alcohol  in the last few weeks  . Drug Use: No  . Sexually Active: Not on file   Other Topics Concern  . Not on file   Social History Narrative   0 caffeine drinks daily       Physical Exam: BP 118/79  Pulse 66  Ht _0  (1.854 m)  Wt 182 lb 3.2 oz (82.645 kg)  BMI 24.04 kg/m2 Constitutional: generally well-appearing Psychiatric: alert and oriented x3 Abdomen: soft, nontender, nondistended, no obvious ascites, no peritoneal signs, normal bowel sounds No lower extremity edema    Assessment and plan: 62 y.o. male with etoh related cirrhosis, likely hepatic hydrothorax  He is a very nice man and has already completely stopped drinking alcohol. He feels he can keep this up. We need to begin hepatoma screening with alpha-fetoprotein and ultrasound which will be done every 6-12 months. We will begin his immunization series for hepatitis B. today. He is going to have a basic set up labs including alpha-fetoprotein and basic metabolic profile today and I will adjust his diuretics accordingly. I actually prefer spiral lactose for first line diuretic over Lasix and I probably will be swapping those out after seeing his electrolytes. I considered starting him on nadolol however his pulses in the 60s his blood pressure is 622  range systolic. Revisit that at his next office visit. He is going to see me again in 4 weeks and sooner if needed.

## 2012-12-03 NOTE — Patient Instructions (Addendum)
You will have labs checked today in the basement lab.  Please head down after you check out with the front desk  (bmet, AFP).  Based on the labs, Dr. Ardis Hughs will likely adjust your diuretics (such as lasix). [place] of liver for hepatoma screening.  You have been scheduled for an abdominal ultrasound at [facility] (1st floor of hospital) on 12/08/12 at 8 am. Please arrive 15 minutes prior to your appointment for registration. Make certain not to have anything to eat or drink 12 hours prior to your appointment. Should you need to reschedule your appointment, please contact radiology at [phone]. This test typically takes about 30 minutes to perform. Will start hepatitis B immunization series today. You will need to come back 01/03/13 at 930 am for the 2nd injection for Hep B and then again in 60 days from today for the last injection. It is important that you have a relatively low salt diet.  High salt diet can cause fluid to accumulate in your legs, abdomen and even around your lungs. You should try to avoid NSAID type over the counter pain medicines as best as possible. Tylenol is safe to take for 'routine' aches and pains, but never take more than 1/2 the dose suggested on the package instructions (never more than 2 grams per day). Avoid alcohol.  You need to continue to NOT DRINK ever again. Return to see Dr. Ardis Hughs in 1 month.

## 2012-12-05 LAB — BODY FLUID CULTURE: Gram Stain: NONE SEEN

## 2012-12-06 ENCOUNTER — Ambulatory Visit: Admitting: Family

## 2012-12-06 ENCOUNTER — Other Ambulatory Visit: Payer: Self-pay

## 2012-12-06 DIAGNOSIS — K7460 Unspecified cirrhosis of liver: Secondary | ICD-10-CM

## 2012-12-06 MED ORDER — SPIRONOLACTONE 50 MG PO TABS: 50.0000 mg | ORAL_TABLET | Freq: Every day | ORAL | Status: DC

## 2012-12-07 ENCOUNTER — Ambulatory Visit (HOSPITAL_COMMUNITY): Attending: Cardiovascular Disease | Admitting: Radiology

## 2012-12-07 ENCOUNTER — Other Ambulatory Visit: Payer: Self-pay

## 2012-12-07 ENCOUNTER — Other Ambulatory Visit (HOSPITAL_COMMUNITY): Payer: Self-pay | Admitting: Family

## 2012-12-07 DIAGNOSIS — R0600 Dyspnea, unspecified: Secondary | ICD-10-CM

## 2012-12-07 DIAGNOSIS — Z87891 Personal history of nicotine dependence: Secondary | ICD-10-CM | POA: Insufficient documentation

## 2012-12-07 DIAGNOSIS — I10 Essential (primary) hypertension: Secondary | ICD-10-CM | POA: Insufficient documentation

## 2012-12-07 DIAGNOSIS — R0609 Other forms of dyspnea: Secondary | ICD-10-CM | POA: Insufficient documentation

## 2012-12-07 DIAGNOSIS — E8770 Fluid overload, unspecified: Secondary | ICD-10-CM

## 2012-12-07 DIAGNOSIS — R0989 Other specified symptoms and signs involving the circulatory and respiratory systems: Secondary | ICD-10-CM | POA: Insufficient documentation

## 2012-12-07 DIAGNOSIS — F1010 Alcohol abuse, uncomplicated: Secondary | ICD-10-CM | POA: Insufficient documentation

## 2012-12-07 DIAGNOSIS — I079 Rheumatic tricuspid valve disease, unspecified: Secondary | ICD-10-CM | POA: Insufficient documentation

## 2012-12-07 NOTE — Progress Notes (Signed)
Echocardiogram performed.  

## 2012-12-08 ENCOUNTER — Ambulatory Visit (HOSPITAL_COMMUNITY)
Admission: RE | Admit: 2012-12-08 | Discharge: 2012-12-08 | Disposition: A | Discharge status: Home / Self Care | Source: Ambulatory Visit | Attending: Gastroenterology | Admitting: Gastroenterology

## 2012-12-08 ENCOUNTER — Telehealth: Payer: Self-pay | Admitting: Family

## 2012-12-08 DIAGNOSIS — J90 Pleural effusion, not elsewhere classified: Secondary | ICD-10-CM | POA: Insufficient documentation

## 2012-12-08 DIAGNOSIS — R188 Other ascites: Secondary | ICD-10-CM | POA: Insufficient documentation

## 2012-12-08 DIAGNOSIS — K7460 Unspecified cirrhosis of liver: Secondary | ICD-10-CM

## 2012-12-08 DIAGNOSIS — I50810 Right heart failure, unspecified: Secondary | ICD-10-CM

## 2012-12-08 NOTE — Telephone Encounter (Signed)
Left message requesting that pt return our call. When pt calls back, please let him know that I have reviewed his echo and that it shows right sided heart failure. This is most likely due to his liver disease.  I have pended referral to cardiology below so that they can help him optimize his medications.

## 2012-12-08 NOTE — Telephone Encounter (Signed)
Notified pt, he voices understanding and is agreeable to referral. Referral signed.

## 2012-12-08 NOTE — Telephone Encounter (Signed)
Reviewed 2-D echo.  Notes R sided heart failure.  This is likely related to pt's underlying liver disease.

## 2012-12-08 NOTE — Telephone Encounter (Signed)
Patient returned phone call. Best # [phone]

## 2012-12-09 ENCOUNTER — Ambulatory Visit (HOSPITAL_BASED_OUTPATIENT_CLINIC_OR_DEPARTMENT_OTHER)
Admission: RE | Admit: 2012-12-09 | Discharge: 2012-12-09 | Disposition: A | Discharge status: Home / Self Care | Source: Ambulatory Visit | Attending: Critical Care Medicine | Admitting: Critical Care Medicine

## 2012-12-09 ENCOUNTER — Encounter: Payer: Self-pay | Admitting: Critical Care Medicine

## 2012-12-09 ENCOUNTER — Ambulatory Visit (INDEPENDENT_AMBULATORY_CARE_PROVIDER_SITE_OTHER): Admitting: Critical Care Medicine

## 2012-12-09 VITALS — BP 132/82 | HR 76 | Temp 97.4°F | Ht 73.0 in | Wt 183.0 lb

## 2012-12-09 DIAGNOSIS — J90 Pleural effusion, not elsewhere classified: Secondary | ICD-10-CM

## 2012-12-09 DIAGNOSIS — J40 Bronchitis, not specified as acute or chronic: Secondary | ICD-10-CM

## 2012-12-09 DIAGNOSIS — R05 Cough: Secondary | ICD-10-CM | POA: Insufficient documentation

## 2012-12-09 DIAGNOSIS — I2789 Other specified pulmonary heart diseases: Secondary | ICD-10-CM

## 2012-12-09 DIAGNOSIS — I517 Cardiomegaly: Secondary | ICD-10-CM | POA: Insufficient documentation

## 2012-12-09 DIAGNOSIS — R059 Cough, unspecified: Secondary | ICD-10-CM | POA: Insufficient documentation

## 2012-12-09 DIAGNOSIS — IMO0002 Reserved for concepts with insufficient information to code with codable children: Secondary | ICD-10-CM | POA: Insufficient documentation

## 2012-12-09 MED ORDER — HYDROCODONE-HOMATROPINE 5-1.5 MG/5ML PO SYRP: 5.0000 mL | ORAL_SOLUTION | Freq: Four times a day (QID) | ORAL | Status: DC | PRN

## 2012-12-09 MED ORDER — AZITHROMYCIN 250 MG PO TABS: 250.0000 mg | ORAL_TABLET | Freq: Every day | ORAL | Status: DC

## 2012-12-09 MED ORDER — BECLOMETHASONE DIPROPIONATE 80 MCG/ACT IN AERS: 2.0000 | INHALATION_SPRAY | Freq: Two times a day (BID) | RESPIRATORY_TRACT | Status: DC

## 2012-12-09 NOTE — Patient Instructions (Signed)
Start Qvar two puff twice daily, use sample, no refill Take azithromycin 259m Take two once then one daily until gone (sent to downstairs pharmacy) No other medication changes Keep appts with Cardiology Keep appts with Dr JArdis [facility]hsReturn [facility]

## 2012-12-09 NOTE — Assessment & Plan Note (Signed)
Recurrent tracheobronchitis due right effusion and compressive atelectasis of right lower lobe Plan 5 days azithromycin Initiate Qvar 80 mcg strength 2 puff twice daily

## 2012-12-09 NOTE — Progress Notes (Signed)
Subjective:    Patient ID: Darren Mueller, male    DOB: 11-Jan-1951, 62 y.o.   MRN: [id number]  HPI 12/09/2012 Pt had 1.5 L removed from R chest .   Pt still with deep cough and green mucus. No fever/chills/sweats. The patient is now seen gastroenterology who feels there is cirrhotic liver disease documented. The patient's diuretic program has been adjusted. Echocardiogram has shown pulmonary hypertension. There is a pending cardiology appointment. The patient's dyspnea is improved with pleural fluid removal.   Past Medical History  Diagnosis Date  . Hypertension   . Seizures   . Hx of colonic polyps   . GERD (gastroesophageal reflux disease)   . Gout   . Alcoholic cirrhosis of liver with ascites 2014     Family History  Problem Relation Age of Onset  . Heart disease Mother   . Prostate cancer Father   . Colon cancer Neg Hx   . Heart attack Mother   . Hypertension Mother      History   Social History  . Marital Status: Widowed    Spouse Name: N/A    Number of Children: 3  . Years of Education: N/A   Occupational History  . Retired     Social research officer, government   Social History Main Topics  . Smoking status: Former Smoker -- 2.0 packs/day for 5 years    Types: Cigarettes    Quit date: 09/22/1979  . Smokeless tobacco: Never Used  . Alcohol Use: Yes     Comment: 3-4 cans of beers and 4-5 drinks of vodka - has not drunk any alcohol in the last few weeks  . Drug Use: No  . Sexually Active: Not on file   Other Topics Concern  . Not on file   Social History Narrative   0 caffeine drinks daily      Allergies  Allergen Reactions  . Aspirin Other (See Comments)    hypertension     Outpatient Prescriptions Prior to Visit  Medication Sig Dispense Refill  . allopurinol (ZYLOPRIM) 100 MG tablet TAKE 1 TABLET DAILY (OFFICE VISIT DUE)  90 tablet  0  . furosemide (LASIX) 40 MG tablet Take 1 tablet (40 mg total) by mouth daily.  30 tablet  0  . losartan (COZAAR) 50 MG tablet Take 1  tablet (50 mg total) by mouth daily.  30 tablet  6  . omeprazole (PRILOSEC) 40 MG capsule Take 1 capsule (40 mg total) by mouth daily.  90 capsule  3  . potassium chloride 40 MEQ/15ML (20%) LIQD Take 20 mEq by mouth daily.      Marland Kitchen spironolactone (ALDACTONE) 50 MG tablet Take 1 tablet (50 mg total) by mouth daily.  30 tablet  11  . verapamil (VERELAN) 180 MG 24 hr capsule Take 1 capsule (180 mg total) by mouth 2 (two) times daily.  180 capsule  0  . [DISCONTINUED] HYDROcodone-homatropine (HYCODAN) 5-1.5 MG/5ML syrup Take 5 mLs by mouth every 6 (six) hours as needed for cough.  240 mL  0  . fluticasone (FLONASE) 50 MCG/ACT nasal spray Place 2 sprays into the nose daily.  16 g  0  . [DISCONTINUED] guaiFENesin (ROBITUSSIN) 100 MG/5ML liquid Take 200 mg by mouth as needed.      . [DISCONTINUED] lacosamide (VIMPAT) 50 MG TABS Take 50 mg by mouth. Takes as directed       . [DISCONTINUED] hepatitis B vac recombinant (RECOMBIVAX) injection 5 mcg       Last reviewed  on 12/09/2012  4:03 PM by Elsie Stain, MD    Review of Systems  Constitutional: Positive for appetite change, fatigue and unexpected weight change. Negative for diaphoresis and activity change.       20# weight loss  HENT: Positive for congestion. Negative for hearing loss, nosebleeds, facial swelling, sneezing, mouth sores, trouble swallowing, neck pain, neck stiffness, dental problem, voice change, sinus pressure, tinnitus and ear discharge.   Eyes: Negative for photophobia, discharge, itching and visual disturbance.  Respiratory: Negative for apnea, choking, chest tightness and stridor.        Qhs cough  Cardiovascular: Negative for palpitations and leg swelling.  Gastrointestinal: Negative for nausea, vomiting, abdominal pain, constipation, blood in stool and abdominal distention.  Genitourinary: Negative for dysuria, urgency, frequency, hematuria, flank pain, decreased urine volume and difficulty urinating.  Musculoskeletal: Negative  for back pain, joint swelling, arthralgias and gait problem.  Skin: Negative for color change and pallor.  Neurological: Negative for dizziness, tremors, seizures, syncope, speech difficulty, weakness, light-headedness and numbness.  Hematological: Negative for adenopathy. Does not bruise/bleed easily.  Psychiatric/Behavioral: Negative for confusion, sleep disturbance and agitation. The patient is not nervous/anxious.        Objective:   Physical Exam  Filed Vitals:   12/09/12 1551  BP: 132/82  Pulse: 76  Temp: 97.4 F (36.3 C)  TempSrc: Oral  Height: _0  (1.854 m)  Weight: 183 lb (83.008 kg)  SpO2: 100%    Gen: Pleasant, thin, in no distress,  normal affect  ENT: No lesions,  mouth clear,  oropharynx clear, no postnasal drip  Neck: No JVD, no TMG, no carotid bruits  Lungs: No use of accessory muscles, no dullness to percussion, improved breath sounds right lower lobe area  Cardiovascular: RRR, heart sounds normal, no murmur or gallops, no peripheral edema  Abdomen: soft and NT, no HSM,  BS normal,  Liver nodular , +ascites note ascites is less compared to last office visit  Musculoskeletal: No deformities, no cyanosis or clubbing  Neuro: alert, non focal  Skin: Warm, no lesions or rashes  Dg Chest 2 View  12/09/2012  *RADIOLOGY REPORT*  Clinical Data: Cough for 2 weeks, pleural effusion, follow-up  CHEST - 2 VIEW  Comparison: Chest x-ray of 12/02/2012  Findings: There does appear to be a small right pleural effusion blunting the posterior costophrenic angle.  The left lung is clear. Mild cardiomegaly is stable.  No skeletal abnormality is seen.  IMPRESSION: Small right pleural effusion.  Stable mild cardiomegaly.   Original Report Authenticated By: Ivar Drape, M.D.    US Abdomen Complete  12/08/2012  *RADIOLOGY REPORT*  Clinical Data:  Evaluate liver for hepatoma screening  ABDOMINAL ULTRASOUND COMPLETE  Comparison:  10/08/2011  Findings:  Gallbladder:  No gallstones,  gallbladder wall thickening, or pericholecystic fluid.  Common Bile Duct:  Within normal limits in caliber.  Liver: The liver has a nodular contour and coarsened echotexture compatible with cirrhosis.  No mass noted.  A small amount of perihepatic ascites is noted.  IVC:  Appears normal.  Pancreas:  No abnormality identified.  Spleen:  Within normal limits in size and echotexture.  Right kidney:  Normal in size and parenchymal echogenicity.  No evidence of mass or hydronephrosis.  Left kidney:  Normal in size and parenchymal echogenicity.  No evidence of mass or hydronephrosis.  Abdominal Aorta:  No aneurysm identified.  Other:  Right pleural effusion noted.  IMPRESSION:  1.  Morphologic features of the liver compatible  with cirrhosis. 2.  No liver mass identified. 3.  Ascites.   Original Report Authenticated By: Kerby Moors, M.D.           Assessment & Plan:   Pleural effusion on right Transudate right pleural effusion due to to chronic liver disease and cirrhosis status post 1-1/2 L removal January 2014 The patient may have mild recurrence of effusion at this visit Plan Repeat chest x-ray Continue diuretic therapy per GI services Dr. Ardis Hughs Note pulmonary hypertension likely secondary to cirrhotic liver disease Agree with cardiology consultation patient may need right heart catheterization  Bronchitis Recurrent tracheobronchitis due right effusion and compressive atelectasis of right lower lobe Plan 5 days azithromycin Initiate Qvar 80 mcg strength 2 puff twice daily    Updated Medication List Outpatient Encounter Prescriptions as of 12/09/2012  Medication Sig Dispense Refill  . allopurinol (ZYLOPRIM) 100 MG tablet TAKE 1 TABLET DAILY (OFFICE VISIT DUE)  90 tablet  0  . furosemide (LASIX) 40 MG tablet Take 1 tablet (40 mg total) by mouth daily.  30 tablet  0  . HYDROcodone-homatropine (HYCODAN) 5-1.5 MG/5ML syrup Take 5 mLs by mouth every 6 (six) hours as needed for cough.  240 mL  0   . losartan (COZAAR) 50 MG tablet Take 1 tablet (50 mg total) by mouth daily.  30 tablet  6  . omeprazole (PRILOSEC) 40 MG capsule Take 1 capsule (40 mg total) by mouth daily.  90 capsule  3  . potassium chloride 40 MEQ/15ML (20%) LIQD Take 20 mEq by mouth daily.      Marland Kitchen spironolactone (ALDACTONE) 50 MG tablet Take 1 tablet (50 mg total) by mouth daily.  30 tablet  11  . verapamil (VERELAN) 180 MG 24 hr capsule Take 1 capsule (180 mg total) by mouth 2 (two) times daily.  180 capsule  0  . [DISCONTINUED] HYDROcodone-homatropine (HYCODAN) 5-1.5 MG/5ML syrup Take 5 mLs by mouth every 6 (six) hours as needed for cough.  240 mL  0  . azithromycin (ZITHROMAX) 250 MG tablet Take 1 tablet (250 mg total) by mouth daily. Take two once then one daily until gone  6 each  0  . beclomethasone (QVAR) 80 MCG/ACT inhaler Inhale 2 puffs into the lungs 2 (two) times daily.  7.3 g  0  . fluticasone (FLONASE) 50 MCG/ACT nasal spray Place 2 sprays into the nose daily.  16 g  0  . [DISCONTINUED] guaiFENesin (ROBITUSSIN) 100 MG/5ML liquid Take 200 mg by mouth as needed.      . [DISCONTINUED] lacosamide (VIMPAT) 50 MG TABS Take 50 mg by mouth. Takes as directed       . [DISCONTINUED] hepatitis B vac recombinant (RECOMBIVAX) injection 5 mcg

## 2012-12-09 NOTE — Assessment & Plan Note (Signed)
Transudate right pleural effusion due to to chronic liver disease and cirrhosis status post 1-1/2 L removal January 2014 The patient may have mild recurrence of effusion at this visit Plan Repeat chest x-ray Continue diuretic therapy per GI services Dr. Ardis Hughs Note pulmonary hypertension likely secondary to cirrhotic liver disease Agree with cardiology consultation patient may need right heart catheterization

## 2012-12-20 ENCOUNTER — Telehealth: Payer: Self-pay | Admitting: Family Medicine

## 2012-12-20 NOTE — Telephone Encounter (Signed)
Pt states his ears are clogged up. Causing pt not to sleep. Has tried over the counter ear drops, but no luck. Pt head stopped up too. Would like to come in today if possible Pls advise.

## 2012-12-20 NOTE — Telephone Encounter (Signed)
Spoke with patient and an appointment made

## 2012-12-21 ENCOUNTER — Encounter: Payer: Self-pay | Admitting: Family Medicine

## 2012-12-21 ENCOUNTER — Ambulatory Visit (INDEPENDENT_AMBULATORY_CARE_PROVIDER_SITE_OTHER): Admitting: Family Medicine

## 2012-12-21 VITALS — BP 120/80 | Wt 192.0 lb

## 2012-12-21 DIAGNOSIS — J309 Allergic rhinitis, unspecified: Secondary | ICD-10-CM

## 2012-12-21 NOTE — Progress Notes (Signed)
  Subjective:    Patient ID: Darren Mueller, male    DOB: 05-02-1951, 62 y.o.   MRN: [id number]  HPI Darren Mueller is a 62 year old male nonsmoker who comes in today for evaluation of head congestion and decreased hearing for couple weeks  We saw him recently with a right pleural effusion. He was originally suspected of having pneumonia however the pleural effusion was actually related to cirrhosis. He also has a pulmonary hypertension. He's had a complete pulmonary workup and he's had a liter drained from his right lung by Dr. Joya Gaskins. He has a cardiac evaluation on the 21st of this month with Dr. Stanford Breed and then a GI consult on March 4 with Dr. Ardis Hughs. He states he is not drinking alcohol anymore.   Review of Systems    review of systems otherwise negative Objective:   Physical Exam  Well-developed well-nourished male in no acute distress HEENT were negative except for postnasal drip and 3+ nasal edema      Assessment & Plan:

## 2012-12-21 NOTE — Patient Instructions (Signed)
Claritin 10 mg plain,,,,,,,,,,,,,,, one tablet daily in the morning

## 2012-12-24 ENCOUNTER — Encounter: Payer: Self-pay | Admitting: Cardiology

## 2012-12-24 ENCOUNTER — Ambulatory Visit (INDEPENDENT_AMBULATORY_CARE_PROVIDER_SITE_OTHER): Admitting: Cardiology

## 2012-12-24 VITALS — BP 122/86 | HR 91 | Wt 186.0 lb

## 2012-12-24 DIAGNOSIS — I279 Pulmonary heart disease, unspecified: Secondary | ICD-10-CM

## 2012-12-24 DIAGNOSIS — I272 Pulmonary hypertension, unspecified: Secondary | ICD-10-CM

## 2012-12-24 DIAGNOSIS — I10 Essential (primary) hypertension: Secondary | ICD-10-CM

## 2012-12-24 DIAGNOSIS — K7460 Unspecified cirrhosis of liver: Secondary | ICD-10-CM

## 2012-12-24 DIAGNOSIS — IMO0002 Reserved for concepts with insufficient information to code with codable children: Secondary | ICD-10-CM

## 2012-12-24 DIAGNOSIS — I2789 Other specified pulmonary heart diseases: Secondary | ICD-10-CM

## 2012-12-24 DIAGNOSIS — J90 Pleural effusion, not elsewhere classified: Secondary | ICD-10-CM

## 2012-12-24 LAB — BASIC METABOLIC PANEL
BUN: 23 mg/dL (ref 6–23)
Chloride: 107 mEq/L (ref 96–112)
Glucose, Bld: 81 mg/dL (ref 70–99)
Potassium: 4.3 mEq/L (ref 3.5–5.1)
Sodium: 136 mEq/L (ref 135–145)

## 2012-12-24 LAB — CBC WITH DIFFERENTIAL/PLATELET
Eosinophils Relative: 1.8 % (ref 0.0–5.0)
HCT: 40.8 % (ref 39.0–52.0)
Hemoglobin: 13.5 g/dL (ref 13.0–17.0)
Lymphs Abs: 1.4 10*3/uL (ref 0.7–4.0)
MCV: 104.8 fl — ABNORMAL HIGH (ref 78.0–100.0)
Monocytes Absolute: 0.4 10*3/uL (ref 0.1–1.0)
Neutro Abs: 1.3 10*3/uL — ABNORMAL LOW (ref 1.4–7.7)
Platelets: 101 10*3/uL — ABNORMAL LOW (ref 150.0–400.0)
RDW: 14.5 % (ref 11.5–14.6)
WBC: 3.2 10*3/uL — ABNORMAL LOW (ref 4.5–10.5)

## 2012-12-24 LAB — PROTIME-INR: Prothrombin Time: 13.2 s — ABNORMAL HIGH (ref 10.2–12.4)

## 2012-12-24 NOTE — Assessment & Plan Note (Signed)
Felt to be related to alcohol. He discontinued use approximately one and one half months ago. He has been previously evaluated by gastroenterology.

## 2012-12-24 NOTE — Patient Instructions (Addendum)
Your physician recommends that you schedule a follow-up appointment in: [place]  Your physician has recommended that you have a pulmonary function test. Pulmonary Function Tests are a group of tests that measure how well air moves in and out of your lungs. AT [facility]  VQ SCAN AT [place] FOR PULMONARY HYPERTENSION  Your physician recommends that you HAVE LAB WORK TODAY  Your physician has requested that you have a cardiac catheterization. Cardiac catheterization is used to diagnose and/or treat various heart conditions. Doctors may recommend this procedure for a number of different reasons. The most common reason is to evaluate chest pain. Chest pain can be a symptom of coronary artery disease (CAD), and cardiac catheterization can show whether plaque is narrowing or blocking your heart's arteries. This procedure is also used to evaluate the valves, as well as measure the blood flow and oxygen levels in different parts of your heart. For further information please visit [email or web address]. Please follow instruction sheet, as given.

## 2012-12-24 NOTE — Assessment & Plan Note (Signed)
Plan continue present dose of diuretics. Patient may require repeat thoracentesis.

## 2012-12-24 NOTE — Assessment & Plan Note (Signed)
Blood pressure controlled. Continue present medications. 

## 2012-12-24 NOTE — Progress Notes (Signed)
HPI: 62 year-old male for evaluation of right-sided congestive heart failure/pulmonary Hypertension. Patient has a history of cirrhosis felt secondary to alcohol (Noted to have varices in 2010 on routine EGD). He developed dyspnea on exertion approximately 6 weeks prior to this evaluation. He was noted to have a right effusion and had thoracentesis which was transudative. Echocardiogram in February 2014 showed an ejection fraction of 55-60%, moderate left ventricular hypertrophy, grade 1 diastolic dysfunction. The right atrium and right ventricle were severely enlarged and right ventricular function was severely reduced. There was mild aortic insufficiency, moderate tricuspid regurgitation and severe pulmonary hypertension. There was a small pericardial effusion. Abdominal ultrasound in January 2014 showed features consistent with cirrhosis and ascites. Chest CT in January of 2014 showed a right pleural effusion, probable cirrhosis and an 33m pulmonary nodule. There is atherosclerosis noted in the coronary arteries. The pulmonary artery was enlarged. The patient's dyspnea improved following thoracentesis but he is now experiencing dyspnea on exertion again. There is no orthopnea occasional pedal edema. No exertional chest pain or syncope. Additional laboratories show normal alpha-fetoprotein tumor marker.   Current Outpatient Prescriptions  Medication Sig Dispense Refill  . allopurinol (ZYLOPRIM) 100 MG tablet TAKE 1 TABLET DAILY (OFFICE VISIT DUE)  90 tablet  0  . beclomethasone (QVAR) 80 MCG/ACT inhaler Inhale 2 puffs into the lungs 2 (two) times daily.  7.3 g  0  . Doxepin HCl (SILENOR) 6 MG TABS Take 1 tablet by mouth daily.      . fluticasone (FLONASE) 50 MCG/ACT nasal spray Place 2 sprays into the nose daily.  16 g  0  . HYDROcodone-homatropine (HYCODAN) 5-1.5 MG/5ML syrup Take 5 mLs by mouth every 6 (six) hours as needed for cough.  240 mL  0  . losartan (COZAAR) 50 MG tablet Take 1 tablet (50 mg  total) by mouth daily.  30 tablet  6  . Misc Natural Products (ALLERGY RELEAF SYSTEM PO) Take 1 tablet by mouth daily.      .Marland Kitchenomeprazole (PRILOSEC) 40 MG capsule Take 1 capsule (40 mg total) by mouth daily.  90 capsule  3  . potassium chloride 40 MEQ/15ML (20%) LIQD Take 20 mEq by mouth daily.      .Marland Kitchenspironolactone (ALDACTONE) 50 MG tablet Take 1 tablet (50 mg total) by mouth daily.  30 tablet  11  . verapamil (VERELAN) 180 MG 24 hr capsule Take 1 capsule (180 mg total) by mouth 2 (two) times daily.  180 capsule  0   No current facility-administered medications for this visit.    Allergies  Allergen Reactions  . Aspirin Other (See Comments)    hypertension    Past Medical History  Diagnosis Date  . Hypertension   . Seizures   . Hx of colonic polyps   . GERD (gastroesophageal reflux disease)   . Gout   . Alcoholic cirrhosis of liver with ascites 2014    History reviewed. No pertinent past surgical history.  History   Social History  . Marital Status: Divorced    Spouse Name: N/A    Number of Children: 3  . Years of Education: N/A   Occupational History  . Retired     ASocial research officer, government  Social History Main Topics  . Smoking status: Former Smoker -- 2.00 packs/day for 5 years    Types: Cigarettes    Quit date: 09/22/1979  . Smokeless tobacco: Never Used  . Alcohol Use: Yes     Comment: 3-4 cans of beers  and 4-5 drinks of vodka - has not drunk any alcohol in the last few weeks  . Drug Use: No  . Sexually Active: Not on file   Other Topics Concern  . Not on file   Social History Narrative   0 caffeine drinks daily     Family History  Problem Relation Age of Onset  . Heart disease Mother   . Prostate cancer Father   . Colon cancer Neg Hx   . Heart attack Mother   . Hypertension Mother     ROS: no fevers or chills, productive cough, hemoptysis, dysphasia, odynophagia, melena, hematochezia, dysuria, hematuria, rash, seizure activity, orthopnea, PND, pedal edema,  claudication. Remaining systems are negative.  Physical Exam:   Blood pressure 122/86, pulse 91, weight 186 lb (84.369 kg).  General:  Well developed/well nourished in NAD Skin warm/dry Patient not depressed No peripheral clubbing Back-normal HEENT-normal/normal eyelids Neck supple/normal carotid upstroke bilaterally; no bruits; no JVD; no thyromegaly chest - diminished breath sounds right lower lobe CV - RRR/normal S1 and S2; no murmurs, rubs or gallops;  PMI nondisplaced Abdomen -NT/ND, no HSM, no mass, + bowel sounds, no bruit 2+ femoral pulses, no bruits Ext-no edema, chords, 2+ DP Neuro-grossly nonfocal  ECG sinus rhythm with first degree AV block. Right axis deviation. Anterior T wave inversion. Incomplete right bundle branch block. Prolonged QT interval.

## 2012-12-24 NOTE — Assessment & Plan Note (Addendum)
Patient is noted to have severe pulmonary hypertension, severe right atrial/right ventricular enlargement and severely reduced right ventricular function. Etiology is unclear but most likely portopulmonary hypertension do to patient's cirrhosis. His cirrhosis is felt most likely related to previous alcohol use based on review of gastroenterology notes. I will schedule a VQ scan to screen for chronic thromboembolic disease. I will also schedule pulmonary function tests with DLCO. Check sedimentation rate, ANA and rheumatoid factor. I will arrange a right heart catheterization with Dr. Haroldine Laws as he will most likely require vasodilator therapy. Note if this is indeed portopulmonary hypertension the prognosis is poor long-term.

## 2012-12-24 NOTE — Addendum Note (Signed)
Addended by: Cristopher Estimable on: 12/24/2012 12:24 PM   Modules accepted: Orders

## 2012-12-27 ENCOUNTER — Ambulatory Visit (INDEPENDENT_AMBULATORY_CARE_PROVIDER_SITE_OTHER): Admitting: Internal Medicine

## 2012-12-27 DIAGNOSIS — R0602 Shortness of breath: Secondary | ICD-10-CM

## 2012-12-27 LAB — PULMONARY FUNCTION TEST

## 2012-12-27 NOTE — Progress Notes (Signed)
PFT done today. 

## 2012-12-29 ENCOUNTER — Encounter (HOSPITAL_COMMUNITY)
Admission: RE | Admit: 2012-12-29 | Discharge: 2012-12-29 | Disposition: A | Discharge status: Home / Self Care | Source: Ambulatory Visit | Attending: Cardiology | Admitting: Cardiology

## 2012-12-29 ENCOUNTER — Other Ambulatory Visit: Payer: Self-pay | Admitting: Cardiology

## 2012-12-29 ENCOUNTER — Telehealth: Payer: Self-pay | Admitting: Cardiology

## 2012-12-29 DIAGNOSIS — I2789 Other specified pulmonary heart diseases: Secondary | ICD-10-CM | POA: Insufficient documentation

## 2012-12-29 DIAGNOSIS — I272 Pulmonary hypertension, unspecified: Secondary | ICD-10-CM

## 2012-12-29 DIAGNOSIS — J90 Pleural effusion, not elsewhere classified: Secondary | ICD-10-CM | POA: Insufficient documentation

## 2012-12-29 MED ORDER — TECHNETIUM TO 99M ALBUMIN AGGREGATED
5.5000 | Freq: Once | INTRAVENOUS | Status: AC | PRN
Start: 2012-12-29 — End: 2012-12-29
  Administered 2012-12-29: 6 via INTRAVENOUS

## 2012-12-29 MED ORDER — TECHNETIUM TC 99M DIETHYLENETRIAME-PENTAACETIC ACID: 43.5000 | Freq: Once | INTRAVENOUS | Status: AC | PRN

## 2012-12-29 NOTE — Telephone Encounter (Signed)
Pt rtning your call

## 2012-12-29 NOTE — Telephone Encounter (Signed)
Spoke with pt, aware of labs and imaging results. Pt will have right cath with dr bensimhon Friday 12-31-12 @ 8:30am in the [place] lab. Instructions given over the phone. Pt voiced understanding.

## 2012-12-31 ENCOUNTER — Encounter (HOSPITAL_BASED_OUTPATIENT_CLINIC_OR_DEPARTMENT_OTHER)
Admission: RE | Disposition: A | Discharge status: Home / Self Care | Payer: Self-pay | Source: Ambulatory Visit | Attending: Internal Medicine

## 2012-12-31 ENCOUNTER — Telehealth (HOSPITAL_COMMUNITY): Payer: Self-pay | Admitting: Cardiology

## 2012-12-31 ENCOUNTER — Inpatient Hospital Stay (HOSPITAL_BASED_OUTPATIENT_CLINIC_OR_DEPARTMENT_OTHER)
Admission: RE | Admit: 2012-12-31 | Discharge: 2012-12-31 | Disposition: A | Discharge status: Home / Self Care | Source: Ambulatory Visit | Attending: Internal Medicine | Admitting: Internal Medicine

## 2012-12-31 DIAGNOSIS — I2789 Other specified pulmonary heart diseases: Secondary | ICD-10-CM | POA: Insufficient documentation

## 2012-12-31 DIAGNOSIS — K7030 Alcoholic cirrhosis of liver without ascites: Secondary | ICD-10-CM | POA: Insufficient documentation

## 2012-12-31 DIAGNOSIS — I272 Pulmonary hypertension, unspecified: Secondary | ICD-10-CM

## 2012-12-31 DIAGNOSIS — I359 Nonrheumatic aortic valve disorder, unspecified: Secondary | ICD-10-CM | POA: Insufficient documentation

## 2012-12-31 DIAGNOSIS — I10 Essential (primary) hypertension: Secondary | ICD-10-CM | POA: Insufficient documentation

## 2012-12-31 DIAGNOSIS — I079 Rheumatic tricuspid valve disease, unspecified: Secondary | ICD-10-CM | POA: Insufficient documentation

## 2012-12-31 DIAGNOSIS — I279 Pulmonary heart disease, unspecified: Secondary | ICD-10-CM

## 2012-12-31 LAB — POCT I-STAT 3, VENOUS BLOOD GAS (G3P V)
Acid-base deficit: 5 mmol/L — ABNORMAL HIGH (ref 0.0–2.0)
Bicarbonate: 19.3 mEq/L — ABNORMAL LOW (ref 20.0–24.0)
O2 Saturation: 54 %
O2 Saturation: 56 %
TCO2: 22 mmol/L (ref 0–100)
pCO2, Ven: 35.1 mmHg — ABNORMAL LOW (ref 45.0–50.0)
pCO2, Ven: 36.8 mmHg — ABNORMAL LOW (ref 45.0–50.0)
pH, Ven: 7.36 — ABNORMAL HIGH (ref 7.250–7.300)
pH, Ven: 7.365 — ABNORMAL HIGH (ref 7.250–7.300)
pH, Ven: 7.378 — ABNORMAL HIGH (ref 7.250–7.300)
pO2, Ven: 29 mmHg — CL (ref 30.0–45.0)
pO2, Ven: 31 mmHg (ref 30.0–45.0)

## 2012-12-31 LAB — POCT I-STAT 3, ART BLOOD GAS (G3+)
Acid-base deficit: 5 mmol/L — ABNORMAL HIGH (ref 0.0–2.0)
O2 Saturation: 94 %
TCO2: 20 mmol/L (ref 0–100)

## 2012-12-31 SURGERY — JV RIGHT HEART CATHETERIZATION
Anesthesia: Moderate Sedation

## 2012-12-31 MED ORDER — SODIUM CHLORIDE 0.9 % IJ SOLN: 3.0000 mL | Freq: Two times a day (BID) | INTRAMUSCULAR | Status: DC

## 2012-12-31 MED ORDER — SODIUM CHLORIDE 0.9 % IV SOLN: 250.0000 mL | INTRAVENOUS | Status: DC | PRN

## 2012-12-31 MED ORDER — DIAZEPAM 2 MG PO TABS
2.0000 mg | ORAL_TABLET | ORAL | Status: AC
  Administered 2012-12-31: 5 mg via ORAL

## 2012-12-31 MED ORDER — MACITENTAN 10 MG PO TABS: 10.0000 mg | ORAL_TABLET | Freq: Every day | ORAL | Status: DC

## 2012-12-31 MED ORDER — SODIUM CHLORIDE 0.9 % IJ SOLN: 3.0000 mL | INTRAMUSCULAR | Status: DC | PRN

## 2012-12-31 MED ORDER — ONDANSETRON HCL 4 MG/2ML IJ SOLN: 4.0000 mg | Freq: Four times a day (QID) | INTRAMUSCULAR | Status: DC | PRN

## 2012-12-31 MED ORDER — ACETAMINOPHEN 325 MG PO TABS: 650.0000 mg | ORAL_TABLET | ORAL | Status: DC | PRN

## 2012-12-31 MED ORDER — SODIUM CHLORIDE 0.9 % IV SOLN: INTRAVENOUS | Status: DC

## 2012-12-31 NOTE — OR Nursing (Signed)
Discharge instructions reviewed and signed, pt stated understanding, ambulated in hall without difficulty, site level 0, transported to brother's car via wheelchair

## 2012-12-31 NOTE — OR Nursing (Signed)
Dr Haroldine Laws at bedside to discuss results and treatment plan with pt and family

## 2012-12-31 NOTE — Telephone Encounter (Signed)
Per vo Dr. Haroldine Laws new medication added. And pt given new P.[facility]tn appt 01/10/13 @ 2:30

## 2012-12-31 NOTE — CV Procedure (Signed)
Cardiac Cath Procedure Note:  Indication:  PAH  Procedures performed:  1) Right heart catheterization  Description of procedure:   The risks and indication of the procedure were explained. Consent was signed and placed on the chart. An appropriate timeout was taken prior to the procedure. The right groin was prepped and draped in the routine sterile fashion and anesthetized with 1% local lidocaine.   A 7 FR venous sheath was placed in the right femoral vein using a modified Seldinger technique. A standard Swan-Ganz catheter was used for the procedure.   Complications: None apparent.  Findings:  RA = 13 RV =  63/5/14 PA =  65/24 (41)  PCW = 6 Hepatic wedge = 21 Fick cardiac output/index = 3.8/1.8 PVR = 9.1 FA sat = 94% PA sat = 58%, 56% SVC = 54%  Assessment:  1. Moderate to severe PAH with cor pulmonale 2. Normal L-sided pressures 3. No evidence of intracardiac shunting  Plan/Discussion:  He appears to have porto-pulmonary HTN with significant PAH and cor pulmonale. Given his cardiac output, he may be close to needing IV epoprostenol. Will start Macitentan 3m daily. Will need 6MW with ambulatory pulse oximetry. [facility]   Daniel Bensimhon,MD 9:20 AM

## 2012-12-31 NOTE — H&P (View-Only) (Signed)
HPI: 62 year-old male for evaluation of right-sided congestive heart failure/pulmonary Hypertension. Patient has a history of cirrhosis felt secondary to alcohol (Noted to have varices in 2010 on routine EGD). He developed dyspnea on exertion approximately 6 weeks prior to this evaluation. He was noted to have a right effusion and had thoracentesis which was transudative. Echocardiogram in February 2014 showed an ejection fraction of 55-60%, moderate left ventricular hypertrophy, grade 1 diastolic dysfunction. The right atrium and right ventricle were severely enlarged and right ventricular function was severely reduced. There was mild aortic insufficiency, moderate tricuspid regurgitation and severe pulmonary hypertension. There was a small pericardial effusion. Abdominal ultrasound in January 2014 showed features consistent with cirrhosis and ascites. Chest CT in January of 2014 showed a right pleural effusion, probable cirrhosis and an 8mm pulmonary nodule. There is atherosclerosis noted in the coronary arteries. The pulmonary artery was enlarged. The patient's dyspnea improved following thoracentesis but he is now experiencing dyspnea on exertion again. There is no orthopnea occasional pedal edema. No exertional chest pain or syncope. Additional laboratories show normal alpha-fetoprotein tumor marker.   Current Outpatient Prescriptions  Medication Sig Dispense Refill  . allopurinol (ZYLOPRIM) 100 MG tablet TAKE 1 TABLET DAILY (OFFICE VISIT DUE)  90 tablet  0  . beclomethasone (QVAR) 80 MCG/ACT inhaler Inhale 2 puffs into the lungs 2 (two) times daily.  7.3 g  0  . Doxepin HCl (SILENOR) 6 MG TABS Take 1 tablet by mouth daily.      . fluticasone (FLONASE) 50 MCG/ACT nasal spray Place 2 sprays into the nose daily.  16 g  0  . HYDROcodone-homatropine (HYCODAN) 5-1.5 MG/5ML syrup Take 5 mLs by mouth every 6 (six) hours as needed for cough.  240 mL  0  . losartan (COZAAR) 50 MG tablet Take 1 tablet (50 mg  total) by mouth daily.  30 tablet  6  . Misc Natural Products (ALLERGY RELEAF SYSTEM PO) Take 1 tablet by mouth daily.      . omeprazole (PRILOSEC) 40 MG capsule Take 1 capsule (40 mg total) by mouth daily.  90 capsule  3  . potassium chloride 40 MEQ/15ML (20%) LIQD Take 20 mEq by mouth daily.      . spironolactone (ALDACTONE) 50 MG tablet Take 1 tablet (50 mg total) by mouth daily.  30 tablet  11  . verapamil (VERELAN) 180 MG 24 hr capsule Take 1 capsule (180 mg total) by mouth 2 (two) times daily.  180 capsule  0   No current facility-administered medications for this visit.    Allergies  Allergen Reactions  . Aspirin Other (See Comments)    hypertension    Past Medical History  Diagnosis Date  . Hypertension   . Seizures   . Hx of colonic polyps   . GERD (gastroesophageal reflux disease)   . Gout   . Alcoholic cirrhosis of liver with ascites 2014    History reviewed. No pertinent past surgical history.  History   Social History  . Marital Status: Divorced    Spouse Name: N/A    Number of Children: 3  . Years of Education: N/A   Occupational History  . Retired     [employer]   Social History Main Topics  . Smoking status: Former Smoker -- 2.00 packs/day for 5 years    Types: Cigarettes    Quit date: 09/22/1979  . Smokeless tobacco: Never Used  . Alcohol Use: Yes     Comment: 3-4 cans of beers   and 4-5 drinks of vodka - has not drunk any alcohol in the last few weeks  . Drug Use: No  . Sexually Active: Not on file   Other Topics Concern  . Not on file   Social History Narrative   0 caffeine drinks daily     Family History  Problem Relation Age of Onset  . Heart disease Mother   . Prostate cancer Father   . Colon cancer Neg Hx   . Heart attack Mother   . Hypertension Mother     ROS: no fevers or chills, productive cough, hemoptysis, dysphasia, odynophagia, melena, hematochezia, dysuria, hematuria, rash, seizure activity, orthopnea, PND, pedal edema,  claudication. Remaining systems are negative.  Physical Exam:   Blood pressure 122/86, pulse 91, weight 186 lb (84.369 kg).  General:  Well developed/well nourished in NAD Skin warm/dry Patient not depressed No peripheral clubbing Back-normal HEENT-normal/normal eyelids Neck supple/normal carotid upstroke bilaterally; no bruits; no JVD; no thyromegaly chest - diminished breath sounds right lower lobe CV - RRR/normal S1 and S2; no murmurs, rubs or gallops;  PMI nondisplaced Abdomen -NT/ND, no HSM, no mass, + bowel sounds, no bruit 2+ femoral pulses, no bruits Ext-no edema, chords, 2+ DP Neuro-grossly nonfocal  ECG sinus rhythm with first degree AV block. Right axis deviation. Anterior T wave inversion. Incomplete right bundle branch block. Prolonged QT interval.

## 2012-12-31 NOTE — OR Nursing (Signed)
Tegaderm dressing applied, site level 0, bedrest begins at 0925

## 2012-12-31 NOTE — Interval H&P Note (Signed)
History and Physical Interval Note:  12/31/2012 8:46 AM  Darren Mueller  has presented today for surgery, with the diagnosis of pulmonary HTN The various methods of treatment have been discussed with the patient and family. After consideration of risks, benefits and other options for treatment, the patient has consented to  Procedure(s): JV RIGHT HEART CATHETERIZATION (N/A) as a surgical intervention .  The patient's history has been reviewed, patient examined, no change in status, stable for surgery.  I have reviewed the patient's chart and labs.  Questions were answered to the patient's satisfaction.     [name]

## 2012-12-31 NOTE — OR Nursing (Signed)
Meal served 

## 2013-01-03 ENCOUNTER — Encounter

## 2013-01-04 ENCOUNTER — Encounter

## 2013-01-04 ENCOUNTER — Ambulatory Visit: Admitting: Gastroenterology

## 2013-01-10 ENCOUNTER — Ambulatory Visit (HOSPITAL_COMMUNITY)
Admit: 2013-01-10 | Discharge: 2013-01-10 | Disposition: A | Discharge status: Home / Self Care | Attending: Internal Medicine | Admitting: Internal Medicine

## 2013-01-10 VITALS — BP 132/78 | HR 97 | Wt 184.8 lb

## 2013-01-10 DIAGNOSIS — R079 Chest pain, unspecified: Secondary | ICD-10-CM | POA: Insufficient documentation

## 2013-01-10 DIAGNOSIS — I2789 Other specified pulmonary heart diseases: Secondary | ICD-10-CM

## 2013-01-10 DIAGNOSIS — I2721 Secondary pulmonary arterial hypertension: Secondary | ICD-10-CM

## 2013-01-10 NOTE — Patient Instructions (Addendum)
Start Macitentan.  NO ALCOHOL.  Your physician recommends that you schedule a follow-up appointment in: 1 month  Do the following things EVERYDAY: 1) Weigh yourself in the morning before breakfast. Write it down and keep it in a log. 2) Take your medicines as prescribed 3) Eat low salt foods-Limit salt (sodium) to 2000 mg per day.  4) Stay as active as you can everyday 5) Limit all fluids for the day to less than 2 liters 6)

## 2013-01-10 NOTE — Progress Notes (Signed)
6 min walk test completed, pt ambulated 1290 feet, his O2 sat ranged from 90-98% on RA

## 2013-01-10 NOTE — Progress Notes (Signed)
Weight Range   Baseline proBNP     HPI:  Mr. Eckels is a 62 year-old male with a history of cirrhosis felt secondary to alcohol (Noted to have varices in 2010 on routine EGD).   He developed dyspnea on exertion approximately 6 weeks ago.  He was noted to have a right effusion and had thoracentesis which was transudative. Echocardiogram in February 2014 showed an ejection fraction of 55-60%, moderate left ventricular hypertrophy, grade 1 diastolic dysfunction. The right atrium and right ventricle were severely enlarged and right ventricular function was severely reduced. There was mild aortic insufficiency, moderate tricuspid regurgitation and severe pulmonary hypertension. There was a small pericardial effusion. Abdominal ultrasound in January 2014 showed features consistent with cirrhosis and ascites. Chest CT in January of 2014 showed a right pleural effusion, probable cirrhosis and an 45m pulmonary nodule. There is atherosclerosis noted in the coronary arteries.   He was referred to Dr. CStanford Breedwho set him up for a RKeyscath. I performed this last week. It confirmed the diagnosis of PAH. He was felt to have porto-pulmonary HTN and started on macitentan.  RA = 13  RV = 63/5/14  PA = 65/24 (41)  PCW = 6  Hepatic wedge = 21  Fick cardiac output/index = 3.8/1.8  PVR = 9.1  FA sat = 94%  PA sat = 58%, 56%  SVC = 54%  Presents today for post-cath f/u. C/o dyspnea with mild exertion. Avoids steps. No edema, orthopnea or PND. If he coughs very hard feels pre-syncopal. Over past few days having cramp-like, sharp R chest pain. Not worse with exertion.    ROS: All systems negative except as listed in HPI, PMH and Problem List.  Past Medical History  Diagnosis Date  . Hypertension   . Seizures   . Hx of colonic polyps   . GERD (gastroesophageal reflux disease)   . Gout   . Alcoholic cirrhosis of liver with ascites 2014    Current Outpatient Prescriptions  Medication Sig Dispense  Refill  . allopurinol (ZYLOPRIM) 100 MG tablet TAKE 1 TABLET DAILY (OFFICE VISIT DUE)  90 tablet  0  . beclomethasone (QVAR) 80 MCG/ACT inhaler Inhale 2 puffs into the lungs 2 (two) times daily.  7.3 g  0  . Doxepin HCl (SILENOR) 6 MG TABS Take 1 tablet by mouth daily.      . Homeopathic Products (SIMILASAN EARACHE RELIEF OT) Place in ear(s) daily.      .Marland KitchenHYDROcodone-homatropine (HYCODAN) 5-1.5 MG/5ML syrup Take 5 mLs by mouth every 6 (six) hours as needed for cough.  240 mL  0  . Loratadine (CLARITIN) 10 MG CAPS Take 1 capsule by mouth daily.      .Marland Kitchenlosartan (COZAAR) 50 MG tablet Take 1 tablet (50 mg total) by mouth daily.  30 tablet  6  . Misc Natural Products (ALLERGY RELEAF SYSTEM PO) Take 1 tablet by mouth daily.      .Marland Kitchenomeprazole (PRILOSEC) 40 MG capsule Take 1 capsule (40 mg total) by mouth daily.  90 capsule  3  . potassium chloride 40 MEQ/15ML (20%) LIQD Take 20 mEq by mouth daily.      .Marland Kitchenspironolactone (ALDACTONE) 50 MG tablet Take 1 tablet (50 mg total) by mouth daily.  30 tablet  11  . verapamil (VERELAN) 180 MG 24 hr capsule Take 1 capsule (180 mg total) by mouth 2 (two) times daily.  180 capsule  0  . Macitentan 10 MG TABS Take 10 mg by  mouth daily.  30 tablet  3   No current facility-administered medications for this encounter.     PHYSICAL EXAM: Filed Vitals:   01/10/13 1451  BP: 132/78  Pulse: 97  Weight: 184 lb 12 oz (83.802 kg)  SpO2: 98%    General:  Well appearing. No resp difficulty HEENT: normal Neck: supple. JVP flat. Carotids 2+ bilaterally; no bruits. No lymphadenopathy or thryomegaly appreciated. Cor: PMI normal. +RV lift Regular rate & rhythm. No rubs, gallops or murmurs. Lungs: clear Abdomen: soft, nontender, nondistended. No hepatosplenomegaly. No bruits or masses. Good bowel sounds. Extremities: no cyanosis, clubbing, rash, edema Neuro: alert & orientedx3, cranial nerves grossly intact. Moves all 4 extremities w/o difficulty. Affect  pleasant.   ASSESSMENT & PLAN:

## 2013-01-11 ENCOUNTER — Ambulatory Visit (INDEPENDENT_AMBULATORY_CARE_PROVIDER_SITE_OTHER)
Admission: RE | Admit: 2013-01-11 | Discharge: 2013-01-11 | Disposition: A | Discharge status: Home / Self Care | Source: Ambulatory Visit | Attending: Gastroenterology | Admitting: Gastroenterology

## 2013-01-11 ENCOUNTER — Ambulatory Visit (INDEPENDENT_AMBULATORY_CARE_PROVIDER_SITE_OTHER): Admitting: Adult Health

## 2013-01-11 ENCOUNTER — Telehealth: Payer: Self-pay | Admitting: *Deleted

## 2013-01-11 ENCOUNTER — Other Ambulatory Visit (INDEPENDENT_AMBULATORY_CARE_PROVIDER_SITE_OTHER)

## 2013-01-11 ENCOUNTER — Ambulatory Visit (INDEPENDENT_AMBULATORY_CARE_PROVIDER_SITE_OTHER): Admitting: Gastroenterology

## 2013-01-11 ENCOUNTER — Encounter: Payer: Self-pay | Admitting: Adult Health

## 2013-01-11 ENCOUNTER — Encounter: Payer: Self-pay | Admitting: Gastroenterology

## 2013-01-11 ENCOUNTER — Encounter

## 2013-01-11 VITALS — BP 114/76 | HR 90 | Temp 97.9°F | Ht 73.0 in | Wt 184.7 lb

## 2013-01-11 VITALS — BP 110/72 | HR 88 | Ht 71.25 in | Wt 184.4 lb

## 2013-01-11 DIAGNOSIS — R0989 Other specified symptoms and signs involving the circulatory and respiratory systems: Secondary | ICD-10-CM

## 2013-01-11 DIAGNOSIS — J90 Pleural effusion, not elsewhere classified: Secondary | ICD-10-CM

## 2013-01-11 DIAGNOSIS — K7460 Unspecified cirrhosis of liver: Secondary | ICD-10-CM

## 2013-01-11 DIAGNOSIS — Z23 Encounter for immunization: Secondary | ICD-10-CM

## 2013-01-11 DIAGNOSIS — R0600 Dyspnea, unspecified: Secondary | ICD-10-CM

## 2013-01-11 LAB — BASIC METABOLIC PANEL
CO2: 22 mEq/L (ref 19–32)
Chloride: 107 mEq/L (ref 96–112)
Glucose, Bld: 93 mg/dL (ref 70–99)
Potassium: 3.4 mEq/L — ABNORMAL LOW (ref 3.5–5.1)
Sodium: 137 mEq/L (ref 135–145)

## 2013-01-11 NOTE — Patient Instructions (Addendum)
Begin Zyrtec 29m daily  Nasonex 2 puffs Twice daily  Until sample is gone  Saline nasal rinses As needed   We are setting you up for a overnight oxygen check  Stop QVAR  Begin Dulera 2065m 2 puffs Twice daily  -brush/rinse and gargle after use  Follow up Dr. WrJoya GaskinsIn 4 weeks and As needed

## 2013-01-11 NOTE — Progress Notes (Signed)
  Subjective:    Patient ID: Darren Mueller, male    DOB: 04-May-1951, 62 y.o.   MRN: [id number]  HPI Darren Mueller is a 34 former smoker year-old male with a history of cirrhosis felt secondary to alcohol (Noted to have varices in 2010 on routine EGD). Seen for initial pulmonary consult 12/09/12 d/t mod right pleural effusion -Transudative -s/p thoracentesis  W/ 1.5 L removed.    01/11/2013 Follow up for pleural effusion and DOE  Pt developed significant DOE ~6 weeks ago, found to have mod right pleural effusion s/p thoracentesis w/ 1.5 L removed-transudative. Subsequent echo showed EF 55-60% , severe Pulmonary HTN (75 mmHg)  with  RA/RV severely enlarged.  Underwent RHC cath that confirmed [place].   He was felt to have porto-pulmonary HTN and started on macitentan by Cards - awaiting drug approval from insurance   6 min walk 3/10 without desats noted.  PFT showed FEV1 58%, ratio 75 and no change with BD Small airway showed reversibility. DLCO reduced at 53%.  He continues to get winded easily with walking.  No increased edema . Wt down 2 lbs.  No increased cough , no discolored mucus .No wheezing   Started on QVAR 4 weeks ago .  Complains of nasal congestion, stuffiness , and ear pressure intermittently  Former heavy smoker, quit 20 yrs ago.   Review of Systems Constitutional:   No  weight loss, night sweats,  Fevers, chills, fatigue, or  lassitude.  HEENT:   No headaches,  Difficulty swallowing,  Tooth/dental problems, or  Sore throat,                No sneezing, itching, ear ache, ++ nasal congestion, post nasal drip,   CV:  No chest pain,  Orthopnea, PND,  anasarca, dizziness, palpitations, syncope.   GI  No heartburn, indigestion, abdominal pain, nausea, vomiting, diarrhea, change in bowel habits, loss of appetite, bloody stools.   Resp:   No excess mucus, no productive cough,  No non-productive cough,  No coughing up of blood.  No change in color of mucus.  No wheezing.  No chest wall  deformity  Skin: no rash or lesions.  GU: no dysuria, change in color of urine, no urgency or frequency.  No flank pain, no hematuria   MS:  No joint pain or swelling.  No decreased range of motion.  No back pain.  Psych:  No change in mood or affect. No depression or anxiety.  No memory loss.         Objective:   Physical Exam GEN: A/Ox3; pleasant , NAD, well nourished   HEENT:  [place]/AT,  EACs-clear, TMs-wnl, NOSE-clear drainage  THROAT-clear, no lesions, no postnasal drip or exudate noted.   NECK:  Supple w/ fair ROM; no JVD; normal carotid impulses w/o bruits; no thyromegaly or nodules palpated; no lymphadenopathy.  RESP  Clear  P & A; w/o, wheezes/ rales/ or rhonchi.no accessory muscle use, no dullness to percussion  CARD:  RRR, no m/r/g  , tr peripheral edema, pulses intact, no cyanosis or clubbing.  GI:   Soft & nt; nml bowel sounds; no organomegaly or masses detected.  Musco: Warm bil, no deformities or joint swelling noted.   Neuro: alert, no focal deficits noted.    Skin: Warm, no lesions or rashes         Assessment & Plan:

## 2013-01-11 NOTE — Telephone Encounter (Signed)
Message copied by Adalberto Ill on Tue Jan 11, 2013  1:58 PM ------      Message from: Elsie Stain      Created: Tue Jan 11, 2013 11:48 AM       Thanks Linna Hoff            We will get him in to f/u on this and CXR            Pat                  Crystal  : he needs to be worked into the schedule and possible thoracentesis            pw      ----- Message -----         From: Milus Banister, MD         Sent: 01/11/2013  11:22 AM           To: Elsie Stain, MD                   ------

## 2013-01-11 NOTE — Telephone Encounter (Signed)
Called, spoke with pt.  He had a cxr done today.  Pt c/o having increased SOB and would like OV today if possible.  We have scheduled him to see TP today at 2:30 pm.  Pt aware and voiced no further questions or concerns at this time.  ** I have spoke with JJ regarding this.  She is aware pt had cxr done today that was ordered by Dr. Ardis Hughs.

## 2013-01-11 NOTE — Patient Instructions (Addendum)
PA and lateral chest xray today for check on pleural effusions. You may need repeat thoracentesis with Dr. Joya Gaskins. You will have labs checked today in the basement lab.  Please head down after you check out with the front desk  (bmet) and will possibly be increasing your diuretic pills (currently on aldactone 49m once daily). Return to see Dr. JArdis Hughsin 4-5 weeks, sooner if needed.                                               We are excited to introduce MyChart, a new best-in-class service that provides you online access to important information in your electronic medical record. We want to make it easier for you to view your health information - all in one secure location - when and where you need it. We expect MyChart will enhance the quality of care and service we provide.  When you register for MyChart, you can:    View your test results.    Request appointments and receive appointment reminders via email.    Request medication renewals.    View your medical history, allergies, medications and immunizations.    Communicate with your physician's office through a password-protected site.    Conveniently print information such as your medication lists.  To find out if MyChart is right for you, please talk to a member of our clinical staff today. We will gladly answer your questions about this free health and wellness tool.  If you are age 4668or older and want a member of your family to have access to your record, you must provide written consent by completing a proxy form available at our office. Please speak to our clinical staff about guidelines regarding accounts for patients younger than age 63  As you activate your MyChart account and need any technical assistance, please call the MyChart technical support line at [phone] or email your question to [email or web address]. If you email your question(s), please include your name, a return phone number and the  best time to reach you.  If you have non-urgent health-related questions, you can send a message to our office through [email or web address] If you have a medical emergency, call 911.  Thank you for using MyChart as your new health and wellness resource!   MyChart licensed from [employer]  1999-2010. Patents Pending.

## 2013-01-11 NOTE — Progress Notes (Signed)
Review of pertinent gastrointestinal problems:  1. history of adenomatous colon polyps : Colonoscopy 12/10: "Two sessile polyps were found in hepatic flexure (8-60m), piecemeal resected and then tatoo'd with INigerInk" path showed "moderate to severe dysplasia" and he was recommended to have repeat examination at 3 month interval. Colonoscopy December 2012 found 4 polyps, one was piecemeal removed, adenoma. He was recommended to have repeat at 6 month recall.  2. incidentally noted esophageal varices on 2012 EGD done for dysphasia. 2012 Abdominal ultrasound showed a normal liver without clear sign of cirrhosis. Was told to cut back on drinking.  3. 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 From chronic etoh over use. Labs 12/12 Hep A Ab +, Hep B S ag and Ab negative, Hep C Ab neg; ANA, AIA, AMA, ceruloplasm, iron testing all neg;  Last liver imaging: 12/12 [place] clear cirrhosis, no focal lesions  Last AFP: 12/2012 normal Last EGD: 2012 small esophageal varices  Hep B immunization series started 11/2012   HPI: This is  A very pleasant 62year old man whom I last saw about 6 weeks ago.  Recent angiogram suggested severe right heart failure, pulmonary hypertension.     Has not been breathing comfortably over the past several days, he feels that the pleural effusion is worsening.  Weight up 2 pounds in about 6-8 weeks.  Past Medical History  Diagnosis Date  . Hypertension   . Seizures   . Hx of colonic polyps   . GERD (gastroesophageal reflux disease)   . Gout   . Alcoholic cirrhosis of liver with ascites 2014    History reviewed. No pertinent past surgical history.  Current Outpatient Prescriptions  Medication Sig Dispense Refill  . allopurinol (ZYLOPRIM) 100 MG tablet TAKE 1 TABLET DAILY (OFFICE VISIT DUE)  90 tablet  0  . beclomethasone (QVAR) 80 MCG/ACT inhaler Inhale 2 puffs into the  lungs 2 (two) times daily.  7.3 g  0  . Doxepin HCl (SILENOR) 6 MG TABS Take 1 tablet by mouth daily.      . Homeopathic Products (SIMILASAN EARACHE RELIEF OT) Place in ear(s) daily.      .Marland KitchenHYDROcodone-homatropine (HYCODAN) 5-1.5 MG/5ML syrup Take 5 mLs by mouth every 6 (six) hours as needed for cough.  240 mL  0  . Loratadine (CLARITIN) 10 MG CAPS Take 1 capsule by mouth daily.      .Marland Kitchenlosartan (COZAAR) 50 MG tablet Take 1 tablet (50 mg total) by mouth daily.  30 tablet  6  . Misc Natural Products (ALLERGY RELEAF SYSTEM PO) Take 1 tablet by mouth daily.      .Marland Kitchenomeprazole (PRILOSEC) 40 MG capsule Take 1 capsule (40 mg total) by mouth daily.  90 capsule  3  . potassium chloride 40 MEQ/15ML (20%) LIQD Take 20 mEq by mouth daily.      .Marland Kitchenspironolactone (ALDACTONE) 50 MG tablet Take 1 tablet (50 mg total) by mouth daily.  30 tablet  11  . verapamil (VERELAN) 180 MG 24 hr capsule Take 1 capsule (180 mg total) by mouth 2 (two) times daily.  180 capsule  0  . Macitentan 10 MG TABS Take 10 mg by mouth daily.  30 tablet  3   No current facility-administered medications for this visit.    Allergies as of 01/11/2013 - Review Complete 01/11/2013  Allergen Reaction Noted  . Aspirin Other (See  Comments) 03/21/2011    Family History  Problem Relation Age of Onset  . Heart disease Mother   . Prostate cancer Father   . Colon cancer Neg Hx   . Heart attack Mother   . Hypertension Mother     History   Social History  . Marital Status: Divorced    Spouse Name: N/A    Number of Children: 3  . Years of Education: N/A   Occupational History  . Retired     Social research officer, government   Social History Main Topics  . Smoking status: Former Smoker -- 2.00 packs/day for 5 years    Types: Cigarettes    Quit date: 09/22/1979  . Smokeless tobacco: Never Used  . Alcohol Use: Yes     Comment: 3-4 cans of beers and 4-5 drinks of vodka - has not drunk any alcohol in the last few weeks  . Drug Use: No  . Sexually Active:  Not on file   Other Topics Concern  . Not on file   Social History Narrative   0 caffeine drinks daily       Physical Exam: BP 110/72  Pulse 88  Ht 5' 11.25" (1.81 m)  Wt 184 lb 6 oz (83.632 kg)  BMI 25.53 kg/m2 Constitutional: generally well-appearing Psychiatric: alert and oriented x3 Abdomen: soft, nontender, nondistended, no obvious ascites, no peritoneal signs, normal bowel sounds No lower extremity edema Lungs are dull to percussion bilaterally lower in his chest. Also poor breath sounds in that area   Assessment and plan: 62 y.o. male with severe right heart failure, cirrhosis  He has been having worsening shortness of breath recently.  I think his pleural effusion is worsening. Hepatic hydrothorax is certainly possible. He also has severe right heart failure and that could be contributing as well. Today he is going to have PA and lateral chest x-rays and I will communicate these results with Dr. Asencion Noble, he may need repeat thoracentesis. 6 weeks ago I changed his diuretic regimen from Lasix 2 Aldactone, pretty low dose. This was due to developing renal insufficiency and hypokalemia. He is currently on low-dose Aldactone at 50 mg a day and so there is hopefully room to increase this. He will also be getting basic metabolic profile to assess his kidney function and electrolytesto help adjust his diuretics. He is going to return to see me in 6-8 weeks and sooner if needed. Most pressing concern currently is his shortness of breath and we are addressing that today.

## 2013-01-12 DIAGNOSIS — R0600 Dyspnea, unspecified: Secondary | ICD-10-CM | POA: Insufficient documentation

## 2013-01-12 NOTE — Assessment & Plan Note (Addendum)
Dyspnea- suspect is multifactoral in nature w/ underlying severe PAH from porto-pulmonary HTN Pt does have restriction on PFT . No significant desats on 6 min  CXR with decreased pleural effusions. Wt is tr down w/ no obvious fluid overload on exam  Recent VQ scan shows low prob for PE   Will change QVAR to ICS/LABA   Set up for overnight Oximetry to check for nocturnal desats.  He is awaiting approval for macitentan from insurance.   Plan  Begin Zyrtec 13m daily  Nasonex 2 puffs Twice daily  Until sample is gone  Saline nasal rinses As needed   We are setting you up for a overnight oxygen check  Stop QVAR  Begin Dulera 2032m 2 puffs Twice daily  -brush/rinse and gargle after use  Follow up Dr. WrJoya GaskinsIn 4 weeks and As needed

## 2013-01-17 ENCOUNTER — Encounter: Payer: Self-pay | Admitting: Adult Health

## 2013-01-19 ENCOUNTER — Encounter: Payer: Self-pay | Admitting: Cardiology

## 2013-01-21 DIAGNOSIS — R079 Chest pain, unspecified: Secondary | ICD-10-CM | POA: Insufficient documentation

## 2013-01-21 DIAGNOSIS — K766 Portal hypertension: Secondary | ICD-10-CM | POA: Insufficient documentation

## 2013-01-21 DIAGNOSIS — I2721 Secondary pulmonary arterial hypertension: Secondary | ICD-10-CM | POA: Insufficient documentation

## 2013-01-21 NOTE — Assessment & Plan Note (Addendum)
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 We reviewed his [place] numbers in detail and I explained the severity of the situation. 6MW today also borderline. We are stating Macitentan. Suspect he will need to move to combination therapy quickly - would likely add Adcirca. If not improving may need to consider IV therapies in the near future. Reinforced need for total abstinence from ETOH.

## 2013-01-21 NOTE — Assessment & Plan Note (Signed)
Very atypical. If persists can consider Myoview.

## 2013-01-24 ENCOUNTER — Ambulatory Visit (HOSPITAL_COMMUNITY)

## 2013-01-24 ENCOUNTER — Encounter: Payer: Self-pay | Admitting: Family Medicine

## 2013-01-24 ENCOUNTER — Other Ambulatory Visit: Payer: Self-pay | Admitting: Family Medicine

## 2013-01-24 ENCOUNTER — Ambulatory Visit (INDEPENDENT_AMBULATORY_CARE_PROVIDER_SITE_OTHER): Admitting: Family Medicine

## 2013-01-24 VITALS — BP 104/72 | HR 72 | Temp 97.6°F | Ht 73.0 in | Wt 203.0 lb

## 2013-01-24 DIAGNOSIS — K7460 Unspecified cirrhosis of liver: Secondary | ICD-10-CM

## 2013-01-24 DIAGNOSIS — M109 Gout, unspecified: Secondary | ICD-10-CM

## 2013-01-24 DIAGNOSIS — I10 Essential (primary) hypertension: Secondary | ICD-10-CM

## 2013-01-24 DIAGNOSIS — E876 Hypokalemia: Secondary | ICD-10-CM

## 2013-01-24 DIAGNOSIS — R630 Anorexia: Secondary | ICD-10-CM

## 2013-01-24 DIAGNOSIS — I2789 Other specified pulmonary heart diseases: Secondary | ICD-10-CM

## 2013-01-24 DIAGNOSIS — E039 Hypothyroidism, unspecified: Secondary | ICD-10-CM

## 2013-01-24 DIAGNOSIS — I2721 Secondary pulmonary arterial hypertension: Secondary | ICD-10-CM

## 2013-01-24 LAB — PHOSPHORUS: Phosphorus: 3.4 mg/dL (ref 2.3–4.6)

## 2013-01-24 LAB — CBC
HCT: 38.3 % — ABNORMAL LOW (ref 39.0–52.0)
Hemoglobin: 13 g/dL (ref 13.0–17.0)
MCV: 97.7 fL (ref 78.0–100.0)
RBC: 3.92 MIL/uL — ABNORMAL LOW (ref 4.22–5.81)
RDW: 15.4 % (ref 11.5–15.5)
WBC: 3.6 10*3/uL — ABNORMAL LOW (ref 4.0–10.5)

## 2013-01-24 LAB — BASIC METABOLIC PANEL
CO2: 22 mEq/L (ref 19–32)
Calcium: 9.2 mg/dL (ref 8.4–10.5)
Potassium: 3.8 mEq/L (ref 3.5–5.3)
Sodium: 140 mEq/L (ref 135–145)

## 2013-01-24 LAB — HEPATIC FUNCTION PANEL
AST: 36 U/L (ref 0–37)
Albumin: 4 g/dL (ref 3.5–5.2)
Bilirubin, Direct: 0.6 mg/dL — ABNORMAL HIGH (ref 0.0–0.3)
Total Bilirubin: 1.6 mg/dL — ABNORMAL HIGH (ref 0.3–1.2)

## 2013-01-24 MED ORDER — ALLOPURINOL 100 MG PO TABS: 100.0000 mg | ORAL_TABLET | Freq: Every day | ORAL | Status: DC

## 2013-01-24 NOTE — Patient Instructions (Addendum)
Start back on the Claritin and add some nasal saline, continue the Nasonex  Serous Otitis Media  Serous otitis media is also known as otitis media with effusion (OME). It means there is fluid in the middle ear space. This space contains the bones for hearing and air. Air in the middle ear space helps to transmit sound.  The air gets there through the eustachian tube. This tube goes from the back of the throat to the middle ear space. It keeps the pressure in the middle ear the same as the outside world. It also helps to drain fluid from the middle ear space. CAUSES  OME occurs when the eustachian tube gets blocked. Blockage can come from:  Ear infections.  Colds and other upper respiratory infections.  Allergies.  Irritants such as cigarette smoke.  Sudden changes in air pressure (such as descending in an airplane).  Enlarged adenoids. During colds and upper respiratory infections, the middle ear space can become temporarily filled with fluid. This can happen after an ear infection also. Once the infection clears, the fluid will generally drain out of the ear through the eustachian tube. If it does not, then OME occurs. SYMPTOMS   Hearing loss.  A feeling of fullness in the ear  but no pain.  Young children may not show any symptoms. DIAGNOSIS   Diagnosis of OME is made by an ear exam.  Tests may be done to check on the movement of the eardrum.  Hearing exams may be done. TREATMENT   The fluid most often goes away without treatment.  If allergy is the cause, allergy treatment may be helpful.  Fluid that persists for several months may require minor surgery. A small tube is placed in the ear drum to:  Drain the fluid.  Restore the air in the middle ear space.  In certain situations, antibiotics are used to avoid surgery.  Surgery may be done to remove enlarged adenoids (if this is the cause). HOME CARE INSTRUCTIONS   Keep children away from tobacco smoke.  Be sure  to keep follow up appointments, if any. SEEK MEDICAL CARE IF:   Hearing is not better in 3 months.  Hearing is worse.  Ear pain.  Drainage from the ear.  Dizziness. Document Released: 01/10/2004 Document Revised: 01/12/2012 Document Reviewed: 11/09/2008 [facility] Patient Information 2013 [place].

## 2013-01-25 ENCOUNTER — Encounter: Payer: Self-pay | Admitting: Family Medicine

## 2013-01-25 DIAGNOSIS — E039 Hypothyroidism, unspecified: Secondary | ICD-10-CM | POA: Insufficient documentation

## 2013-01-25 HISTORY — DX: Hypothyroidism, unspecified: E03.9

## 2013-01-25 LAB — URINALYSIS
Glucose, UA: NEGATIVE mg/dL
Leukocytes, UA: NEGATIVE
Protein, ur: 100 mg/dL — AB
Specific Gravity, Urine: 1.02 (ref 1.005–1.030)

## 2013-01-25 NOTE — Assessment & Plan Note (Signed)
Well controlled at today's visit no changes

## 2013-01-25 NOTE — Assessment & Plan Note (Signed)
Patient denies any ongoing alcohol use. LFTs repeated today

## 2013-01-25 NOTE — Assessment & Plan Note (Signed)
Discussed the idea of cardiopulmonary rehab once he has stabilized and followed up with cardiology and pulmonology

## 2013-01-25 NOTE — Assessment & Plan Note (Signed)
resolved 

## 2013-01-25 NOTE — Assessment & Plan Note (Signed)
tsh suppressed, start Levothyroxine 25 mcg po daily, recheck in roughly 10 weeks

## 2013-01-25 NOTE — Progress Notes (Signed)
Patient ID: Darren Mueller, male   DOB: 1950-11-26, 62 y.o.   MRN: [id number] Darren Mueller [id number] 1951-06-30 01/25/2013      Progress Note-Follow Up  Subjective  Chief Complaint  Chief Complaint  Patient presents with  . Follow-up    3 month    HPI  Patient is a 62 year old male who is in today for followup. He has had a great deal of difficulty the last 2 months with diagnosis of pulmonary hypertension and diastolic heart failure being made. History EtOH abuse with cirrhosis but reports it seemed from alcohol at this time. He reports he still struggles with shortness of breath and debility and does not feel that he is symptomatically improved greatly on his new medications. Does acknowledge his symptoms have not worsened. He does have a cough but he reports is mild and nonproductive. He continues to have some pressure and crackling in ears but offers no complaints of persistent head congestion or fevers. No headache or other neurologic complaints. No GI or GU concerns noted at this time.  Past Medical History  Diagnosis Date  . Hypertension   . Seizures   . Hx of colonic polyps   . GERD (gastroesophageal reflux disease)   . Gout   . Alcoholic cirrhosis of liver with ascites 2014  . Unspecified hypothyroidism 01/25/2013    History reviewed. No pertinent past surgical history.  Family History  Problem Relation Age of Onset  . Heart disease Mother   . Prostate cancer Father   . Colon cancer Neg Hx   . Heart attack Mother   . Hypertension Mother     History   Social History  . Marital Status: Divorced    Spouse Name: N/A    Number of Children: 3  . Years of Education: N/A   Occupational History  . Retired     Social research officer, government   Social History Main Topics  . Smoking status: Former Smoker -- 2.00 packs/day for 5 years    Types: Cigarettes    Quit date: 09/22/1979  . Smokeless tobacco: Never Used  . Alcohol Use: Yes     Comment: 3-4 cans of beers and 4-5 drinks of  vodka - has not drunk any alcohol in the last few weeks  . Drug Use: No  . Sexually Active: Not on file   Other Topics Concern  . Not on file   Social History Narrative   0 caffeine drinks daily     Current Outpatient Prescriptions on File Prior to Visit  Medication Sig Dispense Refill  . beclomethasone (QVAR) 80 MCG/ACT inhaler Inhale 2 puffs into the lungs 2 (two) times daily.  7.3 g  0  . Doxepin HCl (SILENOR) 6 MG TABS Take 1 tablet by mouth daily.      . Homeopathic Products (SIMILASAN EARACHE RELIEF OT) Place in ear(s) daily.      Marland Kitchen HYDROcodone-homatropine (HYCODAN) 5-1.5 MG/5ML syrup Take 5 mLs by mouth every 6 (six) hours as needed for cough.  240 mL  0  . Loratadine (CLARITIN) 10 MG CAPS Take 1 capsule by mouth daily.      Marland Kitchen losartan (COZAAR) 50 MG tablet Take 1 tablet (50 mg total) by mouth daily.  30 tablet  6  . Macitentan 10 MG TABS Take 10 mg by mouth daily.  30 tablet  3  . Misc Natural Products (ALLERGY RELEAF SYSTEM PO) Take 1 tablet by mouth daily.      Marland Kitchen omeprazole (PRILOSEC) 40 MG capsule Take  1 capsule (40 mg total) by mouth daily.  90 capsule  3  . potassium chloride 40 MEQ/15ML (20%) LIQD Take 20 mEq by mouth daily.      Marland Kitchen spironolactone (ALDACTONE) 50 MG tablet Take 1 tablet (50 mg total) by mouth daily.  30 tablet  11  . verapamil (VERELAN) 180 MG 24 hr capsule Take 1 capsule (180 mg total) by mouth 2 (two) times daily.  180 capsule  0   No current facility-administered medications on file prior to visit.    Allergies  Allergen Reactions  . Aspirin Other (See Comments)    hypertension    Review of Systems  Review of Systems  Constitutional: Positive for malaise/fatigue. Negative for fever.  HENT: Negative for congestion, tinnitus and ear discharge.        Ears echo  Eyes: Negative for discharge.  Respiratory: Positive for shortness of breath.   Cardiovascular: Negative for chest pain, palpitations and leg swelling.  Gastrointestinal: Negative for  nausea, abdominal pain and diarrhea.  Genitourinary: Negative for dysuria.  Musculoskeletal: Negative for falls.  Skin: Negative for rash.  Neurological: Negative for loss of consciousness and headaches.  Endo/Heme/Allergies: Negative for polydipsia.  Psychiatric/Behavioral: Negative for depression and suicidal ideas. The patient is not nervous/anxious and does not have insomnia.     Objective  BP 104/72  Pulse 72  Temp(Src) 97.6 F (36.4 C) (Oral)  Ht _0  (1.854 m)  Wt 203 lb (92.08 kg)  BMI 26.79 kg/m2  Physical Exam  Physical Exam  Constitutional: He is oriented to person, place, and time and well-developed, well-nourished, and in no distress. No distress.  HENT:  Head: Normocephalic and atraumatic.  TMs dull, no erythema  Eyes: Conjunctivae are normal.  Neck: Neck supple. No thyromegaly present.  Cardiovascular: Normal rate, regular rhythm and normal heart sounds.   Pulmonary/Chest: Effort normal and breath sounds normal. No respiratory distress.  Abdominal: He exhibits no distension and no mass. There is no tenderness.  Musculoskeletal: He exhibits no edema.  Neurological: He is alert and oriented to person, place, and time.  Skin: Skin is warm.  Psychiatric: Memory, affect and judgment normal.    Lab Results  Component Value Date   TSH 5.521* 01/24/2013   Lab Results  Component Value Date   WBC 3.6* 01/24/2013   HGB 13.0 01/24/2013   HCT 38.3* 01/24/2013   MCV 97.7 01/24/2013   PLT 114* 01/24/2013   Lab Results  Component Value Date   CREATININE 1.40* 01/24/2013   BUN 33* 01/24/2013   NA 140 01/24/2013   K 3.8 01/24/2013   CL 107 01/24/2013   CO2 22 01/24/2013   Lab Results  Component Value Date   ALT 22 01/24/2013   AST 36 01/24/2013   ALKPHOS 173* 01/24/2013   BILITOT 1.6* 01/24/2013   Lab Results  Component Value Date   CHOL 156 11/01/2012   Lab Results  Component Value Date   HDL 56 11/01/2012   Lab Results  Component Value Date   LDLCALC 82  11/01/2012   Lab Results  Component Value Date   TRIG 88 11/01/2012   Lab Results  Component Value Date   CHOLHDL 2.8 11/01/2012     Assessment & Plan  Hypertension Well controlled at today's visit no changes  PAH (pulmonary arterial hypertension) with portal hypertension Discussed the idea of cardiopulmonary rehab once he has stabilized and followed up with cardiology and pulmonology  Hepatic cirrhosis Patient denies any ongoing alcohol use. LFTs  repeated today  Hypokalemia resolved  Unspecified hypothyroidism tsh suppressed, start Levothyroxine 25 mcg po daily, recheck in roughly 10 weeks

## 2013-01-26 ENCOUNTER — Telehealth: Payer: Self-pay | Admitting: Adult Health

## 2013-01-26 ENCOUNTER — Encounter: Payer: Self-pay | Admitting: Adult Health

## 2013-01-26 ENCOUNTER — Telehealth: Payer: Self-pay | Admitting: *Deleted

## 2013-01-26 DIAGNOSIS — E039 Hypothyroidism, unspecified: Secondary | ICD-10-CM

## 2013-01-26 MED ORDER — LEVOTHYROXINE SODIUM 25 MCG PO TABS: 25.0000 ug | ORAL_TABLET | Freq: Every day | ORAL | Status: DC

## 2013-01-26 NOTE — Telephone Encounter (Signed)
3.20.14 ONO results per TP: no sign nocturnal desats; follow up as planned.  Called spoke with patient, advised of ONO results / recs as stated by TP.  Pt verbalized his understanding and denied any questions.  Nothing further needed; will sign off.

## 2013-01-26 NOTE — Telephone Encounter (Signed)
Message copied by Rockwell [place] on Wed Jan 26, 2013 11:33 AM ------      Message from: Penni Homans A      Created: Tue Jan 25, 2013  8:47 AM       Notify labs stable except for thyroid now appears underactive. We should treat with Levothyroxine 25 mcg po daily that should help him with his stamina and energy some recheck TSH in 8-10 weeks. Disp #30 3 rf ------

## 2013-01-26 NOTE — Telephone Encounter (Signed)
Patient informed, understood & agreed; new Rx to pharmacy & future lab order entered/SLS

## 2013-02-01 ENCOUNTER — Ambulatory Visit: Admitting: Gastroenterology

## 2013-02-07 ENCOUNTER — Encounter: Payer: Self-pay | Admitting: Adult Health

## 2013-02-09 ENCOUNTER — Ambulatory Visit (HOSPITAL_COMMUNITY)
Admission: RE | Admit: 2013-02-09 | Discharge: 2013-02-09 | Disposition: A | Discharge status: Home / Self Care | Source: Ambulatory Visit | Attending: Internal Medicine | Admitting: Internal Medicine

## 2013-02-09 VITALS — BP 126/64 | Wt 199.8 lb

## 2013-02-09 DIAGNOSIS — I2721 Secondary pulmonary arterial hypertension: Secondary | ICD-10-CM

## 2013-02-09 DIAGNOSIS — I2789 Other specified pulmonary heart diseases: Secondary | ICD-10-CM

## 2013-02-09 MED ORDER — FUROSEMIDE 40 MG PO TABS: 40.0000 mg | ORAL_TABLET | Freq: Every day | ORAL | Status: DC

## 2013-02-09 NOTE — Patient Instructions (Addendum)
Start Furosemide (Lasix) 40 mg daily  Labs next week   Your physician recommends that you schedule a follow-up appointment in: 6 weeks

## 2013-02-09 NOTE — Addendum Note (Signed)
Encounter addended by: Scarlette Calico, RN on: 02/09/2013 12:21 PM<BR>     Documentation filed: Patient Instructions Section, Orders

## 2013-02-09 NOTE — Assessment & Plan Note (Signed)
Continues with NYHA III-IIIB symptoms. Has not noticed much response to macitentan yet; though 6MW was not too bad. Today has significant volume overload after stopping lasix. Will resume lasix at 40 mg daily. He will f/u with Dr. Joya Gaskins next week and I think he will likely need combination with a PDE-5 inhibitor (either sildenafil or tadalafil). He will also need a BMET next week. I will discuss with Dr. Joya Gaskins. Reinforced need for total ETOH abstinence.

## 2013-02-09 NOTE — Addendum Note (Signed)
Encounter addended by: Scarlette Calico, RN on: 02/09/2013 12:27 PM<BR>     Documentation filed: Orders

## 2013-02-09 NOTE — Progress Notes (Signed)
Weight Range   Baseline proBNP     HPI:  Darren Mueller is a 62 year-old male with a history of cirrhosis felt secondary to alcohol (Noted to have varices in 2010 on routine EGD).   He developed dyspnea on exertion ine arly 2014.  He was noted to have a right effusion and had thoracentesis which was transudative. Echocardiogram in February 2014 showed an ejection fraction of 55-60%, moderate left ventricular hypertrophy, grade 1 diastolic dysfunction. The right atrium and right ventricle were severely enlarged and right ventricular function was severely reduced. There was mild aortic insufficiency, moderate tricuspid regurgitation and severe pulmonary hypertension. There was a small pericardial effusion. Abdominal ultrasound in January 2014 showed features consistent with cirrhosis and ascites. Chest CT in January of 2014 showed a right pleural effusion, probable cirrhosis and an 67m pulmonary nodule. There is atherosclerosis noted in the coronary arteries.   Underwent RHC 12/31/12. It confirmed the diagnosis of PAH. He was felt to have porto-pulmonary HTN and started on macitentan 01/18/2013.  RA = 13  RV = 63/5/14  PA = 65/24 (41)  PCW = 6  Hepatic wedge = 21  Fick cardiac output/index = 3.8/1.8  PVR = 9.1  FA sat = 94%  PA sat = 58%, 56%  SVC = 54%  6 min walk 01/10/13, pt ambulated 1290 feet, his O2 sat ranged from 90-98% on RA   Presents today for f/u. Continues to have Class III-IIIb dyspnea with mild exertion. Has not noticed much benefit with macitentan. Last month Dr. JArdis Hughsstopped lasix and switched to spiro 50 daily due to hypokalemia and renal dysfunction. Occasional edema. No presyncope. Had gained 15 pounds. Says he has been at his Mom's house eating. Very fatigued. +snoring. Weight down 4 pounds from last visit here but still up 10-12 pounds from a few months ago. Says he did drink some alcohol last week for his birthday.    ROS: All systems negative except as listed in HPI,  PMH and Problem List.  Past Medical History  Diagnosis Date  . Hypertension   . Seizures   . Hx of colonic polyps   . GERD (gastroesophageal reflux disease)   . Gout   . Alcoholic cirrhosis of liver with ascites 2014  . Unspecified hypothyroidism 01/25/2013    Current Outpatient Prescriptions  Medication Sig Dispense Refill  . allopurinol (ZYLOPRIM) 100 MG tablet Take 1 tablet (100 mg total) by mouth daily.  90 tablet  1  . beclomethasone (QVAR) 80 MCG/ACT inhaler Inhale 2 puffs into the lungs 2 (two) times daily.  7.3 g  0  . Doxepin HCl (SILENOR) 6 MG TABS Take 1 tablet by mouth daily.      . Homeopathic Products (SIMILASAN EARACHE RELIEF OT) Place in ear(s) daily.      .Marland KitchenHYDROcodone-homatropine (HYCODAN) 5-1.5 MG/5ML syrup Take 5 mLs by mouth every 6 (six) hours as needed for cough.  240 mL  0  . levothyroxine (LEVOTHROID) 25 MCG tablet Take 1 tablet (25 mcg total) by mouth daily.  30 tablet  3  . Loratadine (CLARITIN) 10 MG CAPS Take 1 capsule by mouth daily.      .Marland Kitchenlosartan (COZAAR) 50 MG tablet Take 1 tablet (50 mg total) by mouth daily.  30 tablet  6  . Macitentan 10 MG TABS Take 10 mg by mouth daily.  30 tablet  3  . Misc Natural Products (ALLERGY RELEAF SYSTEM PO) Take 1 tablet by mouth daily.      .Marland Kitchen  omeprazole (PRILOSEC) 40 MG capsule Take 1 capsule (40 mg total) by mouth daily.  90 capsule  3  . potassium chloride 40 MEQ/15ML (20%) LIQD Take 20 mEq by mouth daily.      Marland Kitchen spironolactone (ALDACTONE) 50 MG tablet Take 1 tablet (50 mg total) by mouth daily.  30 tablet  11  . verapamil (VERELAN) 180 MG 24 hr capsule Take 1 capsule (180 mg total) by mouth 2 (two) times daily.  180 capsule  0   No current facility-administered medications for this encounter.     PHYSICAL EXAM: Filed Vitals:   02/09/13 1137  BP: 126/64  Weight: 199 lb 12 oz (90.606 kg)    General:  Well appearing. No resp difficulty. Walks slowly.  HEENT: normal Neck: supple. JVP 10 Carotids 2+  bilaterally; no bruits. No lymphadenopathy or thryomegaly appreciated. Cor: PMI normal. +RV lift Regular rate & rhythm. No rubs, gallops . 2/6TR Lungs: clear Abdomen: soft, nontender, nondistended. No hepatosplenomegaly. No bruits or masses. Good bowel sounds. Extremities: no cyanosis, clubbing, rash, 2-3+ edema Neuro: alert & orientedx3, cranial nerves grossly intact. Moves all 4 extremities w/o difficulty. Affect pleasant.   ASSESSMENT & PLAN:

## 2013-02-10 ENCOUNTER — Ambulatory Visit: Admitting: Critical Care Medicine

## 2013-02-10 ENCOUNTER — Telehealth: Payer: Self-pay | Admitting: *Deleted

## 2013-02-10 NOTE — Telephone Encounter (Signed)
Received msg from PW:  Make sure mr housholder has appt with me next week pw ----- Message ----- From: Jolaine Artist, MD Sent: 02/09/2013 12:19 PM To: Elsie Stain, MD Pat - please see my thoughts and let me know how you want to handle. Thanks -dan  -----------  Pt has a pending OV with Dr. Joya Gaskins on February 17, 2013 at 10:15 am in [place]. Called, spoke with pt.   Reminded him of pending OV with PW and advised to pls keep this appt per PW.  He verbalized understanding of this, will call back if anything is needed prior to appt, and voiced no further questions or concerns at this time.

## 2013-02-10 NOTE — Telephone Encounter (Signed)
error

## 2013-02-17 ENCOUNTER — Encounter: Payer: Self-pay | Admitting: Critical Care Medicine

## 2013-02-17 ENCOUNTER — Ambulatory Visit (INDEPENDENT_AMBULATORY_CARE_PROVIDER_SITE_OTHER): Admitting: Critical Care Medicine

## 2013-02-17 VITALS — BP 126/74 | HR 95 | Temp 98.0°F | Ht 72.0 in | Wt 202.0 lb

## 2013-02-17 DIAGNOSIS — E876 Hypokalemia: Secondary | ICD-10-CM

## 2013-02-17 DIAGNOSIS — J90 Pleural effusion, not elsewhere classified: Secondary | ICD-10-CM

## 2013-02-17 DIAGNOSIS — R0600 Dyspnea, unspecified: Secondary | ICD-10-CM

## 2013-02-17 DIAGNOSIS — I2721 Secondary pulmonary arterial hypertension: Secondary | ICD-10-CM

## 2013-02-17 DIAGNOSIS — J449 Chronic obstructive pulmonary disease, unspecified: Secondary | ICD-10-CM

## 2013-02-17 DIAGNOSIS — I2789 Other specified pulmonary heart diseases: Secondary | ICD-10-CM

## 2013-02-17 DIAGNOSIS — R0609 Other forms of dyspnea: Secondary | ICD-10-CM

## 2013-02-17 LAB — BASIC METABOLIC PANEL
BUN: 33 mg/dL — ABNORMAL HIGH (ref 6–23)
CO2: 25 mEq/L (ref 19–32)
Chloride: 105 mEq/L (ref 96–112)
Creat: 1.37 mg/dL — ABNORMAL HIGH (ref 0.50–1.35)

## 2013-02-17 MED ORDER — POTASSIUM CHLORIDE ER 10 MEQ PO TBCR: 20.0000 meq | EXTENDED_RELEASE_TABLET | Freq: Two times a day (BID) | ORAL | Status: DC

## 2013-02-17 MED ORDER — MOMETASONE FURO-FORMOTEROL FUM 200-5 MCG/ACT IN AERO: 2.0000 | INHALATION_SPRAY | Freq: Two times a day (BID) | RESPIRATORY_TRACT | Status: DC

## 2013-02-17 MED ORDER — FUROSEMIDE 40 MG PO TABS: 40.0000 mg | ORAL_TABLET | Freq: Two times a day (BID) | ORAL | Status: DC

## 2013-02-17 NOTE — Assessment & Plan Note (Addendum)
Portable hypertension with associated pulmonary arterial hypertension due to liver disease documented on recent right heart catheterization and now the patient is on Macitantan 10 mg daily  I do not believe this patient has been on this medication long enough for their be a sufficient physiologic effect but I agree that adding a second vasodilator may be necessary such as revatio.  Plan The patient will maintain current pulmonary vasodilator 10 mg daily The patient needs further diuresis as evidenced by increased weight gain and pedal edema on today's exam No oxygen therapy as needed based on stress and nocturnal oxygen saturations remaining normal Increased Dulera to 2 puffs twice daily as the patient had previously been on ineffective lower dose of Dulera to improve bronchodilation

## 2013-02-17 NOTE — Assessment & Plan Note (Signed)
Note with diuresis the transudate of right pleural effusion has improved and does not require further drainage

## 2013-02-17 NOTE — Patient Instructions (Addendum)
Labs today Increase lasix 54m twice daily Change potassium to 286m tablet twice daily Continue macitantan Increase Dulera two puff twice daily Return 2 months

## 2013-02-17 NOTE — Assessment & Plan Note (Signed)
Chronic obstructive lung disease with persistent moderate peripheral airflow obstruction The patient is not on an effective dose of Dulera at this time Plan Increase Dulera to 2 inhalations twice daily

## 2013-02-17 NOTE — Progress Notes (Signed)
Subjective:    Patient ID: Darren Mueller, male    DOB: 1951/03/09, 62 y.o.   MRN: [id number]  HPI  Darren Mueller is a 62 former smoker year-old male with a history of cirrhosis felt secondary to alcohol (Noted to have varices in 2010 on routine EGD). Seen for initial pulmonary consult 12/09/12 d/t mod right pleural effusion -Transudative -s/p thoracentesis  W/ 1.5 L removed.    01/11/13  Follow up for pleural effusion and DOE  Pt developed significant DOE ~6 weeks ago, found to have mod right pleural effusion s/p thoracentesis w/ 1.5 L removed-transudative. Subsequent echo showed EF 55-60% , severe Pulmonary HTN (75 mmHg)  with  RA/RV severely enlarged.  Underwent RHC cath that confirmed Friona.   He was felt to have porto-pulmonary HTN and started on macitentan by Cards - awaiting drug approval from insurance   6 min walk 3/10 without desats noted.  PFT showed FEV1 58%, ratio 75 and no change with BD Small airway showed reversibility. DLCO reduced at 53%.  He continues to get winded easily with walking.  No increased edema . Wt down 2 lbs.  No increased cough , no discolored mucus .No wheezing   Started on QVAR 4 weeks ago .  Complains of nasal congestion, stuffiness , and ear pressure intermittently  Former heavy smoker, quit 20 yrs ago.   02/17/2013 Rov for PUlm HTN d/t liver dz, copd, hypoxemia, chr resp failure, recurrent pleural effusions d/t liver dz. At 3/11 OV with NP changed Qvar to Darren Mueller for BD properties.  Followed in CHF clinic and started on macitantan See below: Underwent RHC 12/31/12. It confirmed the diagnosis of PAH. He was felt to have porto-pulmonary HTN and started on macitentan 01/18/2013.  RA = 13  RV = 63/5/14  PA = 65/24 (41)  PCW = 6  Hepatic wedge = 21  Fick cardiac output/index = 3.8/1.8  PVR = 9.1  FA sat = 94%  PA sat = 58%, 56%  SVC = 54%  6 min walk 01/10/13, pt ambulated 1290 feet, his O2 sat ranged from 90-98% on RA   Pt back on lasix and aldactone.  (had been taken off lasix and left on aldactone per GI)  ? Of revatio? But only on macitantan for 3 weeks as of yet.   Pt still is dyspneic with exertion. Has to pace self. Has to stop with walking.  No real cough.  No wheeze.  No chest pain .  Weight is up again #203 from 180#      Review of Systems  Constitutional:   No  weight loss, night sweats,  Fevers, chills, fatigue, or  lassitude.  HEENT:   No headaches,  Difficulty swallowing,  Tooth/dental problems, or  Sore throat,                No sneezing, itching, ear ache, ++ nasal congestion, post nasal drip,   CV:  No chest pain,  Orthopnea, PND,  anasarca, dizziness, palpitations, syncope.  +++periph edema  GI  No heartburn, indigestion, abdominal pain, nausea, vomiting, diarrhea, change in bowel habits, loss of appetite, bloody stools.   Resp:   No excess mucus, no productive cough,  No non-productive cough,  No coughing up of blood.  No change in color of mucus.  No wheezing.  No chest wall deformity  Skin: no rash or lesions.  GU: no dysuria, change in color of urine, no urgency or frequency.  No flank pain, no hematuria  MS:  No joint pain or swelling.  No decreased range of motion.  No back pain.  Psych:  No change in mood or affect. No depression or anxiety.  No memory loss.         Objective:   Physical Exam BP 126/74  Pulse 95  Temp(Src) 98 F (36.7 C) (Oral)  Ht 6' (1.829 m)  Wt 202 lb (91.627 kg)  BMI 27.39 kg/m2  SpO2 93%  GEN: A/Ox3; pleasant , NAD, well nourished   HEENT:  [place]/AT,  EACs-clear, TMs-wnl, NOSE-clear drainage  THROAT-clear, no lesions, no postnasal drip or exudate noted.   NECK:  Supple w/ fair ROM; no JVD; normal carotid impulses w/o bruits; no thyromegaly or nodules palpated; no lymphadenopathy.  RESP  Clear  P & A; w/o, wheezes/ rales/ or rhonchi.no accessory muscle use, no dullness to percussion  CARD:  RRR, no m/r/g  , 3++peripheral edema, pulses intact, no cyanosis or  clubbing.  GI:   Soft & nt; nml bowel sounds; no organomegaly or masses detected.  Musco: Warm bil, no deformities or joint swelling noted.   Neuro: alert, no focal deficits noted.    Skin: Warm, no lesions or rashes     Assessment & Plan:   PAH (pulmonary arterial hypertension) with portal hypertension Portable hypertension with associated pulmonary arterial hypertension due to liver disease documented on recent right heart catheterization and now the patient is on Macitantan 10 mg daily  I do not believe this patient has been on this medication long enough for their be a sufficient physiologic effect but I agree that adding a second vasodilator may be necessary such as revatio.  Plan The patient will maintain current pulmonary vasodilator 10 mg daily The patient needs further diuresis as evidenced by increased weight gain and pedal edema on today's exam No oxygen therapy as needed based on stress and nocturnal oxygen saturations remaining normal Increased Dulera to 2 puffs twice daily as the patient had previously been on ineffective lower dose of Dulera to improve bronchodilation  Pleural effusion on right Note with diuresis the transudate of right pleural effusion has improved and does not require further drainage  Obstructive chronic bronchitis without exacerbation Chronic obstructive lung disease with persistent moderate peripheral airflow obstruction The patient is not on an effective dose of Dulera at this time Plan Increase Dulera to 2 inhalations twice daily   Updated Medication List Outpatient Encounter Prescriptions as of 02/17/2013  Medication Sig Dispense Refill  . allopurinol (ZYLOPRIM) 100 MG tablet Take 1 tablet (100 mg total) by mouth daily.  90 tablet  1  . Doxepin HCl (SILENOR) 6 MG TABS Take 1 tablet by mouth daily.      . furosemide (LASIX) 40 MG tablet Take 1 tablet (40 mg total) by mouth 2 (two) times daily.  60 tablet  3  . Homeopathic Products (SIMILASAN  EARACHE RELIEF OT) Place in ear(s) daily.      . Ibuprofen-Diphenhydramine Cit (ADVIL PM PO) Take 1 tablet by mouth at bedtime as needed.      Marland Kitchen levothyroxine (LEVOTHROID) 25 MCG tablet Take 1 tablet (25 mcg total) by mouth daily.  30 tablet  3  . lisinopril (PRINIVIL,ZESTRIL) 10 MG tablet Take 10 mg by mouth daily.      . Loratadine (CLARITIN) 10 MG CAPS Take 1 capsule by mouth daily.      Marland Kitchen losartan (COZAAR) 50 MG tablet Take 1 tablet (50 mg total) by mouth daily.  30 tablet  6  .  Macitentan 10 MG TABS Take 10 mg by mouth daily.      . Misc Natural Products (ALLERGY RELEAF SYSTEM PO) Take 1 tablet by mouth daily.      . mometasone-formoterol (DULERA) 200-5 MCG/ACT AERO Inhale 2 puffs into the lungs 2 (two) times daily.  13 g  6  . omeprazole (PRILOSEC) 40 MG capsule Take 1 capsule (40 mg total) by mouth daily.  90 capsule  3  . spironolactone (ALDACTONE) 50 MG tablet Take 1 tablet (50 mg total) by mouth daily.  30 tablet  11  . verapamil (VERELAN) 180 MG 24 hr capsule Take 1 capsule (180 mg total) by mouth 2 (two) times daily.  180 capsule  0  . [DISCONTINUED] furosemide (LASIX) 40 MG tablet Take 1 tablet (40 mg total) by mouth daily.  30 tablet  3  . [DISCONTINUED] Macitentan 10 MG TABS Take 10 mg by mouth daily.  30 tablet  3  . [DISCONTINUED] mometasone-formoterol (DULERA) 200-5 MCG/ACT AERO Inhale 2 puffs into the lungs daily.      . [DISCONTINUED] potassium chloride 40 MEQ/15ML (20%) LIQD Take 20 mEq by mouth daily.      . potassium chloride (K-DUR) 10 MEQ tablet Take 2 tablets (20 mEq total) by mouth 2 (two) times daily.  120 tablet  6  . [DISCONTINUED] beclomethasone (QVAR) 80 MCG/ACT inhaler Inhale 2 puffs into the lungs 2 (two) times daily.  7.3 g  0  . [DISCONTINUED] HYDROcodone-homatropine (HYCODAN) 5-1.5 MG/5ML syrup Take 5 mLs by mouth every 6 (six) hours as needed for cough.  240 mL  0  . [DISCONTINUED] potassium chloride (K-DUR) 10 MEQ tablet Take 2 tablets (20 mEq total) by mouth  2 (two) times daily.  60 tablet  6   No facility-administered encounter medications on file as of 02/17/2013.

## 2013-02-21 NOTE — Progress Notes (Signed)
Quick Note:  Call pt and tell him labs all ok , potassium levels ok, let him know dr bensimohn may be calling re further changes to his water pills ______

## 2013-02-22 ENCOUNTER — Ambulatory Visit (INDEPENDENT_AMBULATORY_CARE_PROVIDER_SITE_OTHER): Admitting: Gastroenterology

## 2013-02-22 ENCOUNTER — Encounter: Payer: Self-pay | Admitting: Gastroenterology

## 2013-02-22 ENCOUNTER — Other Ambulatory Visit (INDEPENDENT_AMBULATORY_CARE_PROVIDER_SITE_OTHER)

## 2013-02-22 VITALS — BP 120/72 | HR 80 | Ht 72.0 in | Wt 201.5 lb

## 2013-02-22 DIAGNOSIS — K7460 Unspecified cirrhosis of liver: Secondary | ICD-10-CM

## 2013-02-22 LAB — BASIC METABOLIC PANEL
BUN: 34 mg/dL — ABNORMAL HIGH (ref 6–23)
Calcium: 9 mg/dL (ref 8.4–10.5)
Creatinine, Ser: 2.2 mg/dL — ABNORMAL HIGH (ref 0.4–1.5)
GFR: 40.12 mL/min — ABNORMAL LOW (ref >60.00)
Potassium: 4 mEq/L (ref 3.5–5.1)

## 2013-02-22 NOTE — Patient Instructions (Addendum)
You will have labs checked today in the basement lab.  Please head down after you check out with the front desk  (BMET). Will plan to adjust your diuretics upward if electrolytes, kidney function allow). You should buy a scale and weigh yourself daily at home. Please return to see Dr. Ardis Hughs in 5 weeks.                                               We are excited to introduce MyChart, a new best-in-class service that provides you online access to important information in your electronic medical record. We want to make it easier for you to view your health information - all in one secure location - when and where you need it. We expect MyChart will enhance the quality of care and service we provide.  When you register for MyChart, you can:    View your test results.    Request appointments and receive appointment reminders via email.    Request medication renewals.    View your medical history, allergies, medications and immunizations.    Communicate with your physician's office through a password-protected site.    Conveniently print information such as your medication lists.  To find out if MyChart is right for you, please talk to a member of our clinical staff today. We will gladly answer your questions about this free health and wellness tool.  If you are age 63 or older and want a member of your family to have access to your record, you must provide written consent by completing a proxy form available at our office. Please speak to our clinical staff about guidelines regarding accounts for patients younger than age 63.  As you activate your MyChart account and need any technical assistance, please call the MyChart technical support line at [phone]) or email your question to [email or web address]. If you email your question(s), please include your name, a return phone number and the best time to reach you.  If you have non-urgent health-related questions, you  can send a message to our office through [place] [facility] [email or web address]. If you have a medical emergency, call 911.  Thank you for using MyChart as your new health and wellness resource!   MyChart licensed from [employer],  1999-2010. Patents Pending.

## 2013-02-22 NOTE — Progress Notes (Signed)
Review of pertinent gastrointestinal problems:  1. history of adenomatous colon polyps : Colonoscopy 12/10: "Two sessile polyps were found in hepatic flexure (8-68m), piecemeal resected and then tatoo'd with INigerInk" path showed "moderate to severe dysplasia" and he was recommended to have repeat examination at 3 month interval. Colonoscopy December 2012 found 4 polyps, one was piecemeal removed, adenoma. He was recommended to have repeat at 6 month recall.  2. incidentally noted esophageal varices on 2012 EGD done for dysphasia. 2012 Abdominal ultrasound showed a normal liver without clear sign of cirrhosis. Was told to cut back on drinking.  3. 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 From chronic etoh over use. Labs 12/12 Hep A Ab +, Hep B S ag and Ab negative, Hep C Ab neg; ANA, AIA, AMA, ceruloplasm, iron testing all neg;  Last liver imaging: 12/12 [place] clear cirrhosis, no focal lesions  Last AFP: 12/2012 normal  Last EGD: 2012 small esophageal varices  Hep B immunization series started 11/2012 4. 2014 right heart angiogram suggested severe right heart failure, pulmonary hypertension started on macitentan 01/18/2013.   HPI: This is a very pleasant 62year old man whom I last saw about 5 weeks ago.  Weight is up 17 pounds in past 4-6 weeks since last visit.  I had pulled back on his diuretics due to concern of developing renal insufficiency and he significantly put on fluid weight.  He restarted lasix last week, currently on 445mbid.  Also still on aldactone 50 mg once daily.  Urine output increased. He can tell his edema has improved in legs  "a little bit."  Still not breathing well.  He is following up closely with doctors WrJoya Gaskinsnd Be[facility]eth[facility]  Past Medical History  Diagnosis Date  . Hypertension   . Seizures   . Hx of colonic polyps   . GERD (gastroesophageal reflux disease)    . Gout   . Alcoholic cirrhosis of liver with ascites 2014  . Unspecified hypothyroidism 01/25/2013  . Unspecified pleural effusion   . Other chronic pulmonary heart diseases   . Hypopotassemia     No past surgical history on file.  Current Outpatient Prescriptions  Medication Sig Dispense Refill  . allopurinol (ZYLOPRIM) 100 MG tablet Take 1 tablet (100 mg total) by mouth daily.  90 tablet  1  . Doxepin HCl (SILENOR) 6 MG TABS Take 1 tablet by mouth daily.      . furosemide (LASIX) 40 MG tablet Take 1 tablet (40 mg total) by mouth 2 (two) times daily.  60 tablet  3  . Homeopathic Products (SIMILASAN EARACHE RELIEF OT) Place in ear(s) daily.      . Ibuprofen-Diphenhydramine Cit (ADVIL PM PO) Take 1 tablet by mouth at bedtime as needed.      . Marland Kitchenevothyroxine (LEVOTHROID) 25 MCG tablet Take 1 tablet (25 mcg total) by mouth daily.  30 tablet  3  . lisinopril (PRINIVIL,ZESTRIL) 10 MG tablet Take 10 mg by mouth daily.      . Loratadine (CLARITIN) 10 MG CAPS Take 1 capsule by mouth daily.      . Marland Kitchenosartan (COZAAR) 50 MG tablet Take 1 tablet (50 mg total) by mouth daily.  30 tablet  6  . Macitentan 10 MG TABS Take 10 mg by mouth daily.      . Misc Natural Products (ALLERGY RELEAF SYSTEM PO) Take 1 tablet by  mouth daily.      . mometasone-formoterol (DULERA) 200-5 MCG/ACT AERO Inhale 2 puffs into the lungs 2 (two) times daily.  13 g  6  . omeprazole (PRILOSEC) 40 MG capsule Take 1 capsule (40 mg total) by mouth daily.  90 capsule  3  . potassium chloride (K-DUR) 10 MEQ tablet Take 2 tablets (20 mEq total) by mouth 2 (two) times daily.  120 tablet  6  . spironolactone (ALDACTONE) 50 MG tablet Take 1 tablet (50 mg total) by mouth daily.  30 tablet  11  . verapamil (VERELAN) 180 MG 24 hr capsule Take 1 capsule (180 mg total) by mouth 2 (two) times daily.  180 capsule  0   No current facility-administered medications for this visit.    Allergies as of 02/22/2013 - Review Complete 02/22/2013   Allergen Reaction Noted  . Aspirin Other (See Comments) 03/21/2011    Family History  Problem Relation Age of Onset  . Heart disease Mother   . Prostate cancer Father   . Colon cancer Neg Hx   . Heart attack Mother   . Hypertension Mother     History   Social History  . Marital Status: Divorced    Spouse Name: N/A    Number of Children: 3  . Years of Education: N/A   Occupational History  . Retired     Social research officer, government   Social History Main Topics  . Smoking status: Former Smoker -- 2.00 packs/day for 5 years    Types: Cigarettes    Quit date: 09/22/1979  . Smokeless tobacco: Never Used  . Alcohol Use: Yes     Comment: 3-4 cans of beers and 4-5 drinks of vodka - has not drunk any alcohol in the last few weeks  . Drug Use: No  . Sexually Active: Not on file   Other Topics Concern  . Not on file   Social History Narrative   0 caffeine drinks daily       Physical Exam: BP 120/72  Pulse 80  Ht 6' (1.829 m)  Wt 201 lb 8 oz (91.4 kg)  BMI 27.32 kg/m2 Constitutional: generally well-appearing Psychiatric: alert and oriented x3 Abdomen: soft, nontender, nondistended, no obvious ascites, no peritoneal signs, normal bowel sounds Lower extremities with 1+ pitting edema bilaterally    Assessment and plan: 62 y.o. male with severe pulmonary hypertension, cirrhosis  I had backed off on his diuretics somewhat due to the concern of developing renal insufficiency and he quickly put on significant fluid weight. Lasix was restarted last week. At that time his electrolytes were normal but his kidney numbers were slightly elevated. I am going to repeat basic metabolic profile today and if tolerated I may increase his diuretics. Currently he is taking 40 mg of Lasix twice daily and 50 mg of Aldactone once daily.  I would like to see him again in 5-6 weeks and sooner if needed. I advised him scale so that he can weight himself at home

## 2013-02-22 NOTE — Progress Notes (Signed)
Quick Note:  Called, spoke with pt. Informed him of lab results and recs per Dr. Joya Gaskins. He verbalized understanding and voiced no further questions or concerns at this time. ______

## 2013-02-24 ENCOUNTER — Other Ambulatory Visit: Payer: Self-pay

## 2013-02-24 DIAGNOSIS — K7460 Unspecified cirrhosis of liver: Secondary | ICD-10-CM

## 2013-03-02 ENCOUNTER — Ambulatory Visit (HOSPITAL_COMMUNITY)
Admission: RE | Admit: 2013-03-02 | Discharge: 2013-03-02 | Disposition: A | Discharge status: Home / Self Care | Source: Ambulatory Visit | Attending: Internal Medicine | Admitting: Internal Medicine

## 2013-03-02 VITALS — BP 114/76 | Wt 200.0 lb

## 2013-03-02 DIAGNOSIS — I2721 Secondary pulmonary arterial hypertension: Secondary | ICD-10-CM

## 2013-03-02 DIAGNOSIS — I279 Pulmonary heart disease, unspecified: Secondary | ICD-10-CM | POA: Insufficient documentation

## 2013-03-02 DIAGNOSIS — K766 Portal hypertension: Secondary | ICD-10-CM | POA: Insufficient documentation

## 2013-03-02 DIAGNOSIS — I2789 Other specified pulmonary heart diseases: Secondary | ICD-10-CM

## 2013-03-02 LAB — BASIC METABOLIC PANEL
CO2: 22 mEq/L (ref 19–32)
Chloride: 108 mEq/L (ref 96–112)
GFR calc Af Amer: 60 mL/min — ABNORMAL LOW (ref >90)
Potassium: 3.7 mEq/L (ref 3.5–5.1)
Sodium: 140 mEq/L (ref 135–145)

## 2013-03-02 MED ORDER — TADALAFIL (PAH) 20 MG PO TABS: 20.0000 mg | ORAL_TABLET | Freq: Every day | ORAL | Status: DC

## 2013-03-02 MED ORDER — TADALAFIL 20 MG PO TABS: 20.0000 mg | ORAL_TABLET | Freq: Every day | ORAL | Status: DC | PRN

## 2013-03-02 NOTE — Progress Notes (Signed)
Patient ID: Darren Mueller, male   DOB: 01/13/1951, 62 y.o.   MRN: [id number]  Weight Range   Baseline proBNP     HPI: Darren Mueller is a 62 year-old male with a history of cirrhosis felt secondary to alcohol (Noted to have varices in 2010 on routine EGD).   He developed dyspnea on exertion in early 2014.  He was noted to have a right effusion and had thoracentesis which was transudative.   Echocardiogram in February 2014 showed an ejection fraction of 55-60%, moderate left ventricular hypertrophy, grade 1 diastolic dysfunction. The right atrium and right ventricle were severely enlarged and right ventricular function was severely reduced. There was mild aortic insufficiency, moderate tricuspid regurgitation and severe pulmonary hypertension.  Abdominal ultrasound in January 2014 showed features consistent with cirrhosis and ascites. Chest CT in January of 2014 showed a right pleural effusion, probable cirrhosis and an 23m pulmonary nodule. There is atherosclerosis noted in the coronary arteries.   Underwent RHC 12/31/12. It confirmed the diagnosis of PAH. He was felt to have porto-pulmonary HTN and started on macitentan 01/18/2013.  RA = 13  RV = 63/5/14  PA = 65/24 (41)  PCW = 6  Hepatic wedge = 21  Fick cardiac output/index = 3.8/1.8  PVR = 9.1  FA sat = 94%  PA sat = 58%, 56%  SVC = 54%  6 min walk 01/10/13, pt ambulated 1290 feet, his O2 sat ranged from 90-98% on RA  He returns for follow up. He started on Macitentan 5 weeks ago. With no real improvement. Lasix titrated up to 40 bid but Cr bumped 1.4-> 2.2. Now back to 40 mg daily. Minimal edema. Weight stable. No orthopnea or PND. Denies dizziness.  He takes breaks walking to the back of the store.  Compliant with medications. Drinking alcohol.    ROS: All systems negative except as listed in HPI, PMH and Problem List.  Past Medical History  Diagnosis Date  . Hypertension   . Seizures   . Hx of colonic polyps   . GERD  (gastroesophageal reflux disease)   . Gout   . Alcoholic cirrhosis of liver with ascites 2014  . Unspecified hypothyroidism 01/25/2013  . Unspecified pleural effusion   . Other chronic pulmonary heart diseases   . Hypopotassemia     Current Outpatient Prescriptions  Medication Sig Dispense Refill  . allopurinol (ZYLOPRIM) 100 MG tablet Take 1 tablet (100 mg total) by mouth daily.  90 tablet  1  . Doxepin HCl (SILENOR) 6 MG TABS Take 1 tablet by mouth daily.      . furosemide (LASIX) 40 MG tablet Take 40 mg by mouth daily.      . Homeopathic Products (SIMILASAN EARACHE RELIEF OT) Place in ear(s) daily.      . Ibuprofen-Diphenhydramine Cit (ADVIL PM PO) Take 1 tablet by mouth at bedtime as needed.      .Marland Kitchenlevothyroxine (LEVOTHROID) 25 MCG tablet Take 1 tablet (25 mcg total) by mouth daily.  30 tablet  3  . lisinopril (PRINIVIL,ZESTRIL) 10 MG tablet Take 10 mg by mouth daily.      . Loratadine (CLARITIN) 10 MG CAPS Take 1 capsule by mouth as needed.       .Marland Kitchenlosartan (COZAAR) 50 MG tablet Take 1 tablet (50 mg total) by mouth daily.  30 tablet  6  . Macitentan 10 MG TABS Take 10 mg by mouth daily.      . Misc Natural Products (ALLERGY RELEAF SYSTEM PO)  Take 1 tablet by mouth daily.      Marland Kitchen omeprazole (PRILOSEC) 40 MG capsule Take 1 capsule (40 mg total) by mouth daily.  90 capsule  3  . potassium chloride (K-DUR) 10 MEQ tablet Take 2 tablets (20 mEq total) by mouth 2 (two) times daily.  120 tablet  6  . spironolactone (ALDACTONE) 50 MG tablet Take 1 tablet (50 mg total) by mouth daily.  30 tablet  11  . verapamil (VERELAN) 180 MG 24 hr capsule Take 1 capsule (180 mg total) by mouth 2 (two) times daily.  180 capsule  0  . mometasone-formoterol (DULERA) 200-5 MCG/ACT AERO Inhale 2 puffs into the lungs 2 (two) times daily.  13 g  6   No current facility-administered medications for this encounter.     PHYSICAL EXAM: Filed Vitals:   03/02/13 1400  BP: 114/76  Weight: 200 lb (90.719 kg)     General:  Well appearing. No resp difficulty. Brother present  HEENT: normal Neck: supple. JVP 9 Carotids 2+ bilaterally; no bruits. No lymphadenopathy or thryomegaly appreciated. Cor: PMI normal. +RV lift Regular rate & rhythm. No rubs, gallops . 2/6TR Lungs: clear Abdomen: soft, nontender, nondistended. No hepatosplenomegaly. No bruits or masses. Good bowel sounds. Extremities: no cyanosis, clubbing, rash, 2-3+ edema Neuro: alert & orientedx3, cranial nerves grossly intact. Moves all 4 extremities w/o difficulty. Affect pleasant.   ASSESSMENT & PLAN:

## 2013-03-02 NOTE — Assessment & Plan Note (Addendum)
Continues with NYHA III-IIIB symptoms.Functionally he does not report any improvement with current therapy. Continue Macitentan and add adcirca 20 mg daily. Schedule 6 MW and obtain ECHO for objective assessments of progress.   Volume status stable. Continue lasix 40 mg daily. Check BMET today. Reinforced need for total ETOH abstinence. Follow up in 1 month. I do not think it is time to refer for IV epoprostenol yet.

## 2013-03-02 NOTE — Patient Instructions (Addendum)
Take adicirca 20 mg daily  We will schedule ECHO and 6 MW  Follow up in 4 weeks

## 2013-03-07 ENCOUNTER — Ambulatory Visit (INDEPENDENT_AMBULATORY_CARE_PROVIDER_SITE_OTHER): Admitting: Family Medicine

## 2013-03-07 ENCOUNTER — Encounter: Payer: Self-pay | Admitting: Family Medicine

## 2013-03-07 VITALS — BP 124/82 | HR 90 | Temp 97.3°F | Ht 73.0 in | Wt 203.0 lb

## 2013-03-07 DIAGNOSIS — R079 Chest pain, unspecified: Secondary | ICD-10-CM

## 2013-03-07 DIAGNOSIS — G4700 Insomnia, unspecified: Secondary | ICD-10-CM

## 2013-03-07 DIAGNOSIS — I10 Essential (primary) hypertension: Secondary | ICD-10-CM

## 2013-03-07 DIAGNOSIS — E039 Hypothyroidism, unspecified: Secondary | ICD-10-CM

## 2013-03-07 MED ORDER — DOXEPIN HCL 6 MG PO TABS: 1.0000 | ORAL_TABLET | Freq: Every day | ORAL | Status: DC

## 2013-03-07 NOTE — Patient Instructions (Addendum)
Consider Melatonin caps at bedtime if helpful can continue  Insomnia Insomnia is frequent trouble falling and/or staying asleep. Insomnia can be a long term problem or a short term problem. Both are common. Insomnia can be a short term problem when the wakefulness is related to a certain stress or worry. Long term insomnia is often related to ongoing stress during waking hours and/or poor sleeping habits. Overtime, sleep deprivation itself can make the problem worse. Every little thing feels more severe because you are overtired and your ability to cope is decreased. CAUSES   Stress, anxiety, and depression.  Poor sleeping habits.  Distractions such as TV in the bedroom.  Naps close to bedtime.  Engaging in emotionally charged conversations before bed.  Technical reading before sleep.  Alcohol and other sedatives. They may make the problem worse. They can hurt normal sleep patterns and normal dream activity.  Stimulants such as caffeine for several hours prior to bedtime.  Pain syndromes and shortness of breath can cause insomnia.  Exercise late at night.  Changing time zones may cause sleeping problems (jet lag). It is sometimes helpful to have someone observe your sleeping patterns. They should look for periods of not breathing during the night (sleep apnea). They should also look to see how long those periods last. If you live alone or observers are uncertain, you can also be observed at a sleep clinic where your sleep patterns will be professionally monitored. Sleep apnea requires a checkup and treatment. Give your caregivers your medical history. Give your caregivers observations your family has made about your sleep.  SYMPTOMS   Not feeling rested in the morning.  Anxiety and restlessness at bedtime.  Difficulty falling and staying asleep. TREATMENT   Your caregiver may prescribe treatment for an underlying medical disorders. Your caregiver can give advice or help if you  are using alcohol or other drugs for self-medication. Treatment of underlying problems will usually eliminate insomnia problems.  Medications can be prescribed for short time use. They are generally not recommended for lengthy use.  Over-the-counter sleep medicines are not recommended for lengthy use. They can be habit forming.  You can promote easier sleeping by making lifestyle changes such as:  Using relaxation techniques that help with breathing and reduce muscle tension.  Exercising earlier in the day.  Changing your diet and the time of your last meal. No night time snacks.  Establish a regular time to go to bed.  Counseling can help with stressful problems and worry.  Soothing music and white noise may be helpful if there are background noises you cannot remove.  Stop tedious detailed work at least one hour before bedtime. HOME CARE INSTRUCTIONS   Keep a diary. Inform your caregiver about your progress. This includes any medication side effects. See your caregiver regularly. Take note of:  Times when you are asleep.  Times when you are awake during the night.  The quality of your sleep.  How you feel the next day. This information will help your caregiver care for you.  Get out of bed if you are still awake after 15 minutes. Read or do some quiet activity. Keep the lights down. Wait until you feel sleepy and go back to bed.  Keep regular sleeping and waking hours. Avoid naps.  Exercise regularly.  Avoid distractions at bedtime. Distractions include watching television or engaging in any intense or detailed activity like attempting to balance the household checkbook.  Develop a bedtime ritual. Keep a familiar routine of  bathing, brushing your teeth, climbing into bed at the same time each night, listening to soothing music. Routines increase the success of falling to sleep faster.  Use relaxation techniques. This can be using breathing and muscle tension release  routines. It can also include visualizing peaceful scenes. You can also help control troubling or intruding thoughts by keeping your mind occupied with boring or repetitive thoughts like the old concept of counting sheep. You can make it more creative like imagining planting one beautiful flower after another in your backyard garden.  During your day, work to eliminate stress. When this is not possible use some of the previous suggestions to help reduce the anxiety that accompanies stressful situations. MAKE SURE YOU:   Understand these instructions.  Will watch your condition.  Will get help right away if you are not doing well or get worse. Document Released: 10/17/2000 Document Revised: 01/12/2012 Document Reviewed: 11/17/2007 [facility] Patient Information 2013 [place].

## 2013-03-08 ENCOUNTER — Telehealth: Payer: Self-pay

## 2013-03-08 NOTE — Telephone Encounter (Signed)
PA sent for Darren Mueller.. Pt taking prior to seeing Dr Charlett Blake.   No pa required for zolpidem immediate-release tablet (Ambien)

## 2013-03-09 ENCOUNTER — Ambulatory Visit (HOSPITAL_COMMUNITY)
Admission: RE | Admit: 2013-03-09 | Discharge: 2013-03-09 | Disposition: A | Discharge status: Home / Self Care | Source: Ambulatory Visit | Attending: Family Medicine | Admitting: Family Medicine

## 2013-03-09 ENCOUNTER — Ambulatory Visit (HOSPITAL_COMMUNITY)
Admission: RE | Admit: 2013-03-09 | Discharge: 2013-03-09 | Disposition: A | Discharge status: Home / Self Care | Source: Ambulatory Visit | Attending: Internal Medicine | Admitting: Internal Medicine

## 2013-03-09 ENCOUNTER — Encounter: Payer: Self-pay | Admitting: Family Medicine

## 2013-03-09 VITALS — BP 142/80 | HR 97 | Wt 205.0 lb

## 2013-03-09 DIAGNOSIS — I2721 Secondary pulmonary arterial hypertension: Secondary | ICD-10-CM

## 2013-03-09 DIAGNOSIS — I359 Nonrheumatic aortic valve disorder, unspecified: Secondary | ICD-10-CM

## 2013-03-09 DIAGNOSIS — Z87891 Personal history of nicotine dependence: Secondary | ICD-10-CM | POA: Insufficient documentation

## 2013-03-09 DIAGNOSIS — I2789 Other specified pulmonary heart diseases: Secondary | ICD-10-CM | POA: Insufficient documentation

## 2013-03-09 DIAGNOSIS — I10 Essential (primary) hypertension: Secondary | ICD-10-CM | POA: Insufficient documentation

## 2013-03-09 DIAGNOSIS — K7460 Unspecified cirrhosis of liver: Secondary | ICD-10-CM | POA: Insufficient documentation

## 2013-03-09 DIAGNOSIS — F1010 Alcohol abuse, uncomplicated: Secondary | ICD-10-CM | POA: Insufficient documentation

## 2013-03-09 NOTE — Assessment & Plan Note (Signed)
BP normal. No changes

## 2013-03-09 NOTE — Progress Notes (Signed)
Patient in for 6 min walk test, test cancelled due to desaturation, Dr Haroldine Laws is aware.  SATURATION QUALIFICATIONS: (This note is used to comply with regulatory documentation for home oxygen)  Patient Saturations on Room Air at Rest = 96%  Patient Saturations on Room Air while Ambulating = 75%  Patient Saturations on 2 Liters of oxygen while Ambulating = 95%  Please briefly explain why patient needs home oxygen:desaturation with ambulation   Patients weight is up 5 lb from last week and he still has +2 LE bilat edema, per Dr Haroldine Laws start pt on 2 L oxygen, increase Lasix to 40 mg bid for 2 days, pt is aware and agreeable, order sent to [facility] for home oxygen

## 2013-03-09 NOTE — Telephone Encounter (Signed)
Pa approved per pharmacy

## 2013-03-09 NOTE — Progress Notes (Signed)
  Echocardiogram 2D Echocardiogram has been performed.  Basilia Jumbo 03/09/2013, 1:41 PM

## 2013-03-09 NOTE — Assessment & Plan Note (Signed)
Mildly under treated will need repeat testing at next visit and possible adjustment of synthroid dosing

## 2013-03-09 NOTE — Assessment & Plan Note (Signed)
Has an appt with cardiology next week, sees Dr Tempie Hoist. No recent chest pain. Is frustrated with how tired he currently is on this current regimen of medications.

## 2013-03-09 NOTE — Progress Notes (Signed)
Patient ID: Darren Mueller, male   DOB: [birth year], 62 y.o.   MRN: [id number] Catalino Plascencia [id number] 06/12/1951 03/09/2013      Progress Note-Follow Up  Subjective  Chief Complaint  Chief Complaint  Patient presents with  . Follow-up    6 week    HPI  Patient is a 62 year old male who is in today for followup. Is struggling with significant fatigue and is frustrated with how much she is sleeping at the present time. Has an appointment next week with cardiology to discuss his medications. Denies chest pain or palpitations but does continue to struggle shortness of breath. No recent illness. No fevers or chills. No GI or GU complaints at this time  Past Medical History  Diagnosis Date  . Hypertension   . Seizures   . Hx of colonic polyps   . GERD (gastroesophageal reflux disease)   . Gout   . Alcoholic cirrhosis of liver with ascites 2014  . Unspecified hypothyroidism 01/25/2013  . Unspecified pleural effusion   . Other chronic pulmonary heart diseases   . Hypopotassemia     History reviewed. No pertinent past surgical history.  Family History  Problem Relation Age of Onset  . Heart disease Mother   . Prostate cancer Father   . Colon cancer Neg Hx   . Heart attack Mother   . Hypertension Mother     History   Social History  . Marital Status: Divorced    Spouse Name: N/A    Number of Children: 3  . Years of Education: N/A   Occupational History  . Retired     Social research officer, government   Social History Main Topics  . Smoking status: Former Smoker -- 2.00 packs/day for 5 years    Types: Cigarettes    Quit date: 09/22/1979  . Smokeless tobacco: Never Used  . Alcohol Use: Yes     Comment: 3-4 cans of beers and 4-5 drinks of vodka - has not drunk any alcohol in the last few weeks  . Drug Use: No  . Sexually Active: Not on file   Other Topics Concern  . Not on file   Social History Narrative   0 caffeine drinks daily     Current Outpatient Prescriptions on File Prior to  Visit  Medication Sig Dispense Refill  . allopurinol (ZYLOPRIM) 100 MG tablet Take 1 tablet (100 mg total) by mouth daily.  90 tablet  1  . furosemide (LASIX) 40 MG tablet Take 40 mg by mouth daily.      . Homeopathic Products (SIMILASAN EARACHE RELIEF OT) Place in ear(s) daily.      . Ibuprofen-Diphenhydramine Cit (ADVIL PM PO) Take 1 tablet by mouth at bedtime as needed.      Marland Kitchen levothyroxine (LEVOTHROID) 25 MCG tablet Take 1 tablet (25 mcg total) by mouth daily.  30 tablet  3  . lisinopril (PRINIVIL,ZESTRIL) 10 MG tablet Take 10 mg by mouth daily.      . Loratadine (CLARITIN) 10 MG CAPS Take 1 capsule by mouth as needed.       Marland Kitchen losartan (COZAAR) 50 MG tablet Take 1 tablet (50 mg total) by mouth daily.  30 tablet  6  . Macitentan 10 MG TABS Take 10 mg by mouth daily.      . Misc Natural Products (ALLERGY RELEAF SYSTEM PO) Take 1 tablet by mouth daily.      . mometasone-formoterol (DULERA) 200-5 MCG/ACT AERO Inhale 2 puffs into the lungs 2 (two) times daily.  13 g  6  . omeprazole (PRILOSEC) 40 MG capsule Take 1 capsule (40 mg total) by mouth daily.  90 capsule  3  . potassium chloride (K-DUR) 10 MEQ tablet Take 2 tablets (20 mEq total) by mouth 2 (two) times daily.  120 tablet  6  . spironolactone (ALDACTONE) 50 MG tablet Take 1 tablet (50 mg total) by mouth daily.  30 tablet  11  . Tadalafil, PAH, (ADCIRCA) 20 MG TABS Take 1 tablet (20 mg total) by mouth daily.  30 tablet  3  . verapamil (VERELAN) 180 MG 24 hr capsule Take 1 capsule (180 mg total) by mouth 2 (two) times daily.  180 capsule  0   No current facility-administered medications on file prior to visit.    Allergies  Allergen Reactions  . Aspirin Other (See Comments)    hypertension    Review of Systems  Review of Systems  Constitutional: Positive for malaise/fatigue. Negative for fever.  HENT: Negative for congestion.   Eyes: Negative for discharge.  Respiratory: Positive for shortness of breath.   Cardiovascular:  Negative for chest pain, palpitations and leg swelling.  Gastrointestinal: Positive for heartburn. Negative for nausea, abdominal pain and diarrhea.  Genitourinary: Negative for dysuria.  Musculoskeletal: Negative for falls.  Skin: Negative for rash.  Neurological: Negative for loss of consciousness and headaches.  Endo/Heme/Allergies: Negative for polydipsia.  Psychiatric/Behavioral: Negative for depression and suicidal ideas. The patient is not nervous/anxious and does not have insomnia.     Objective  BP 124/82  Pulse 90  Temp(Src) 97.3 F (36.3 C) (Oral)  Ht _0  (1.854 m)  Wt 203 lb (92.08 kg)  BMI 26.79 kg/m2  SpO2 97%  Physical Exam  Physical Exam  Constitutional: He is oriented to person, place, and time and well-developed, well-nourished, and in no distress. No distress.  HENT:  Head: Normocephalic and atraumatic.  Eyes: Conjunctivae are normal.  Neck: Neck supple. No thyromegaly present.  Cardiovascular: Normal rate, regular rhythm and normal heart sounds.   No murmur heard. Pulmonary/Chest: Effort normal and breath sounds normal. No respiratory distress.  Abdominal: He exhibits no distension and no mass. There is no tenderness.  Musculoskeletal: He exhibits no edema.  Neurological: He is alert and oriented to person, place, and time.  Skin: Skin is warm.  Psychiatric: Memory, affect and judgment normal.    Lab Results  Component Value Date   TSH 5.521* 01/24/2013   Lab Results  Component Value Date   WBC 3.6* 01/24/2013   HGB 13.0 01/24/2013   HCT 38.3* 01/24/2013   MCV 97.7 01/24/2013   PLT 114* 01/24/2013   Lab Results  Component Value Date   CREATININE 1.41* 03/02/2013   BUN 26* 03/02/2013   NA 140 03/02/2013   K 3.7 03/02/2013   CL 108 03/02/2013   CO2 22 03/02/2013   Lab Results  Component Value Date   ALT 22 01/24/2013   AST 36 01/24/2013   ALKPHOS 173* 01/24/2013   BILITOT 1.6* 01/24/2013   Lab Results  Component Value Date   CHOL 156 11/01/2012    Lab Results  Component Value Date   HDL 56 11/01/2012   Lab Results  Component Value Date   LDLCALC 82 11/01/2012   Lab Results  Component Value Date   TRIG 88 11/01/2012   Lab Results  Component Value Date   CHOLHDL 2.8 11/01/2012     Assessment & Plan  Hypertension BP normal. No changes  Chest pain Has an  appt with cardiology next week, sees Dr Tempie Hoist. No recent chest pain. Is frustrated with how tired he currently is on this current regimen of medications.  Unspecified hypothyroidism Mildly under treated will need repeat testing at next visit and possible adjustment of synthroid dosing

## 2013-03-09 NOTE — Patient Instructions (Addendum)
Increase Furosemide to 40 mg Twice daily for 2 days, if weight not coming down let me know.  Keep follow up appointment for 5/22 at 11:30  [place] will contact you about home oxygen

## 2013-03-16 NOTE — Telephone Encounter (Signed)
PA approved until 11-02-2098

## 2013-03-24 ENCOUNTER — Encounter (HOSPITAL_COMMUNITY): Payer: Self-pay

## 2013-03-24 ENCOUNTER — Ambulatory Visit (HOSPITAL_COMMUNITY)
Admission: RE | Admit: 2013-03-24 | Discharge: 2013-03-24 | Disposition: A | Discharge status: Home / Self Care | Source: Ambulatory Visit | Attending: Internal Medicine | Admitting: Internal Medicine

## 2013-03-24 VITALS — BP 130/76 | HR 107 | Wt 204.1 lb

## 2013-03-24 DIAGNOSIS — I2789 Other specified pulmonary heart diseases: Secondary | ICD-10-CM | POA: Insufficient documentation

## 2013-03-24 DIAGNOSIS — I2721 Secondary pulmonary arterial hypertension: Secondary | ICD-10-CM

## 2013-03-24 DIAGNOSIS — K766 Portal hypertension: Secondary | ICD-10-CM | POA: Insufficient documentation

## 2013-03-24 NOTE — Progress Notes (Signed)
6 min walk test preformed, pt ambulated 1330 ft, O2 sat ranged from 99-92% on room air

## 2013-03-24 NOTE — Patient Instructions (Addendum)
Your physician recommends that you schedule a follow-up appointment in: 1 month

## 2013-03-24 NOTE — Progress Notes (Signed)
Patient ID: Darren Mueller, male   DOB: [birth year], 62 y.o.   MRN: [id number]  Weight Range   Baseline proBNP     HPI: [name] is a 62 year-old male with a history of cirrhosis felt secondary to alcohol (Noted to have varices in 2010 on routine EGD).   He developed dyspnea on exertion in early 2014.  He was noted to have a right effusion and had thoracentesis which was transudative.   Echocardiogram in February 2014 showed an ejection fraction of 55-60%, moderate left ventricular hypertrophy, grade 1 diastolic dysfunction. The right atrium and right ventricle were severely enlarged and right ventricular function was severely reduced. There was mild aortic insufficiency, moderate tricuspid regurgitation and severe pulmonary hypertension.  Abdominal ultrasound in January 2014 showed features consistent with cirrhosis and ascites. Chest CT in January of 2014 showed a right pleural effusion, probable cirrhosis and an 73m pulmonary nodule. There is atherosclerosis noted in the coronary arteries.   Underwent [facility] 12/31/12. It confirmed the diagnosis of PAH. He was felt to have porto-pulmonary HTN and started on macitentan 01/18/2013.  RA = 13  RV = 63/5/14  PA = 65/24 (41)  PCW = 6  Hepatic wedge = 21  Fick cardiac output/index = 3.8/1.8  PVR = 9.1  FA sat = 94%  PA sat = 58%, 56%  SVC = 54%  6 min walk 01/10/13, pt ambulated 1290 feet, his O2 sat ranged from 90-98% on RA  He returns for follow up. He started on Macitentan about 2 months ago but doesn't feel like it has helped much. Adcirca added at last visit but hasn't received yet. Lasix titrated up to 40 bid but Cr bumped 1.4-> 2.2 so cu back to 40 mg daily. However fluid got worse so now back on 40 bid. No recent labs.  Minimal edema. Weight stable at 196-198. No orthopnea or PND. Denies dizziness/syncope. Still drinking ETOH some had 3-4 beers last night. SOB with minimal activity. Echo 2 weeks ago with EF 55% with severe RV  dilation/dysfunction. Mild TR PAP 65-70 TAPSE 1.1 Now Getting CP about 1x/day. Describes it as a pressure. Not particularly worse with exertion.  6 MW today (03/24/13) 1330 feet. O2 > 90% on ear probe no CP    ROS: All systems negative except as listed in HPI, PMH and Problem List.  Past Medical History  Diagnosis Date  . Hypertension   . Seizures   . Hx of colonic polyps   . GERD (gastroesophageal reflux disease)   . Gout   . Alcoholic cirrhosis of liver with ascites 2014  . Unspecified hypothyroidism 01/25/2013  . Unspecified pleural effusion   . Other chronic pulmonary heart diseases   . Hypopotassemia     Current Outpatient Prescriptions  Medication Sig Dispense Refill  . allopurinol (ZYLOPRIM) 100 MG tablet Take 1 tablet (100 mg total) by mouth daily.  90 tablet  1  . beclomethasone (QVAR) 80 MCG/ACT inhaler Inhale 2 puffs into the lungs 2 (two) times daily.      . Doxepin HCl (SILENOR) 6 MG TABS Take 1 tablet (6 mg total) by mouth daily.  30 tablet  2  . furosemide (LASIX) 40 MG tablet Take 40 mg by mouth daily.      . Homeopathic Products (SIMILASAN EARACHE RELIEF OT) Place in ear(s) daily.      . Ibuprofen-Diphenhydramine Cit (ADVIL PM PO) Take 1 tablet by mouth at bedtime as needed.      .Marland Kitchenlevothyroxine (LEVOTHROID)  25 MCG tablet Take 1 tablet (25 mcg total) by mouth daily.  30 tablet  3  . lisinopril (PRINIVIL,ZESTRIL) 10 MG tablet Take 10 mg by mouth daily.      . Loratadine (CLARITIN) 10 MG CAPS Take 1 capsule by mouth as needed.       Marland Kitchen losartan (COZAAR) 50 MG tablet Take 1 tablet (50 mg total) by mouth daily.  30 tablet  6  . Macitentan 10 MG TABS Take 10 mg by mouth daily.      . Misc Natural Products (ALLERGY RELEAF SYSTEM PO) Take 1 tablet by mouth daily.      . mometasone-formoterol (DULERA) 200-5 MCG/ACT AERO Inhale 2 puffs into the lungs 2 (two) times daily.  13 g  6  . omeprazole (PRILOSEC) 40 MG capsule Take 1 capsule (40 mg total) by mouth daily.  90 capsule   3  . potassium chloride (K-DUR) 10 MEQ tablet Take 2 tablets (20 mEq total) by mouth 2 (two) times daily.  120 tablet  6  . spironolactone (ALDACTONE) 50 MG tablet Take 1 tablet (50 mg total) by mouth daily.  30 tablet  11  . verapamil (VERELAN) 180 MG 24 hr capsule Take 1 capsule (180 mg total) by mouth 2 (two) times daily.  180 capsule  0  . Tadalafil, PAH, (ADCIRCA) 20 MG TABS Take 1 tablet (20 mg total) by mouth daily.  30 tablet  3   No current facility-administered medications for this encounter.     PHYSICAL EXAM: Filed Vitals:   03/24/13 1154  BP: 130/76  Pulse: 107  Weight: 204 lb 1.9 oz (92.588 kg)  SpO2: 93%    General:  Well appearing. No resp difficulty. Brother present  HEENT: normal Neck: supple. JVP 9 Carotids 2+ bilaterally; no bruits. No lymphadenopathy or thryomegaly appreciated. Cor: PMI normal. +RV lift Regular rate & rhythm. No rubs, gallops . 2/6TR Lungs: clear Abdomen: soft, nontender, nondistended. No hepatosplenomegaly. No bruits or masses. Good bowel sounds. Extremities: no cyanosis, clubbing, rash, 2-3+ edema Neuro: alert & orientedx3, cranial nerves grossly intact. Moves all 4 extremities w/o difficulty. Affect pleasant.   ASSESSMENT & PLAN:

## 2013-03-25 ENCOUNTER — Encounter (HOSPITAL_COMMUNITY): Payer: Self-pay | Admitting: Internal Medicine

## 2013-03-27 NOTE — Assessment & Plan Note (Signed)
He continues to struggle with advanced symptoms and echo shows persistent RV strain and cor pulmonale. He had 6MW today which was fairly stable though. We reviewed his cath numbers and these are fairly severe.We again discussed the possiblity of IV therapies. He is hesitant about this. We have ordered Adcirca but he hasn't received it yet. We will give combination therapy for a few weeks and can also try adding an inhaled agent. If getting worse will need f/u [facility] and referral for IV prostanoids. Again reinforced the need for abstinence from ETOH but it does not appear as he is making a concerted effort to do so.

## 2013-04-05 ENCOUNTER — Ambulatory Visit (INDEPENDENT_AMBULATORY_CARE_PROVIDER_SITE_OTHER): Admitting: Gastroenterology

## 2013-04-05 ENCOUNTER — Encounter: Payer: Self-pay | Admitting: Gastroenterology

## 2013-04-05 VITALS — BP 110/68 | HR 82 | Ht 71.25 in | Wt 202.0 lb

## 2013-04-05 DIAGNOSIS — K7460 Unspecified cirrhosis of liver: Secondary | ICD-10-CM

## 2013-04-05 NOTE — Progress Notes (Signed)
Review of pertinent gastrointestinal problems:  1. history of adenomatous colon polyps : Colonoscopy 12/10: "Two sessile polyps were found in hepatic flexure (8-43m), piecemeal resected and then tatoo'd with INigerInk" path showed "moderate to severe dysplasia" and he was recommended to have repeat examination at 3 month interval. Colonoscopy December 2012 found 4 polyps, one was piecemeal removed, adenoma. He was recommended to have repeat at 6 month recall.  2. incidentally noted esophageal varices on 2012 EGD done for dysphasia. 2012 Abdominal ultrasound showed a normal liver without clear sign of cirrhosis. Was told to cut back on drinking.  3. 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 From chronic etoh over use. Labs 12/12 Hep A Ab +, Hep B S ag and Ab negative, Hep C Ab neg; ANA, AIA, AMA, ceruloplasm, iron testing all neg;  Last liver imaging: 12/12 [place] clear cirrhosis, no focal lesions  Last AFP: 12/2012 normal  Last EGD: 2012 small esophageal varices  Hep B immunization series started 11/2012  4. 2014 right heart angiogram suggested severe right heart failure, pulmonary hypertension started on macitentan 01/18/2013.  Followed closely by CHF clinic who has assumed lead role in managing diuretics.   HPI: This is a   very pleasant 62year old man whom I last saw about 6 weeks ago.  Weight stable on our scale over past 6-7 weeks.  Diuretics recently changed by Dr. BHaroldine Laws  He is breathing the same as always, takes oxygen at home PRN.  He is starting cialis for cardiac reasons, prescribed by Dr. BHaroldine Laws   Past Medical History  Diagnosis Date  . Hypertension   . Seizures   . Hx of colonic polyps   . GERD (gastroesophageal reflux disease)   . Gout   . Alcoholic cirrhosis of liver with ascites 2014  . Unspecified hypothyroidism 01/25/2013  . Unspecified pleural effusion   . Other  chronic pulmonary heart diseases   . Hypopotassemia     History reviewed. No pertinent past surgical history.  Current Outpatient Prescriptions  Medication Sig Dispense Refill  . allopurinol (ZYLOPRIM) 100 MG tablet Take 1 tablet (100 mg total) by mouth daily.  90 tablet  1  . beclomethasone (QVAR) 80 MCG/ACT inhaler Inhale 2 puffs into the lungs 2 (two) times daily.      . Doxepin HCl (SILENOR) 6 MG TABS Take 1 tablet (6 mg total) by mouth daily.  30 tablet  2  . furosemide (LASIX) 40 MG tablet Take 40 mg by mouth daily.      . Homeopathic Products (SIMILASAN EARACHE RELIEF OT) Place in ear(s) daily.      . Ibuprofen-Diphenhydramine Cit (ADVIL PM PO) Take 1 tablet by mouth at bedtime as needed.      .Marland Kitchenlevothyroxine (LEVOTHROID) 25 MCG tablet Take 1 tablet (25 mcg total) by mouth daily.  30 tablet  3  . lisinopril (PRINIVIL,ZESTRIL) 10 MG tablet Take 10 mg by mouth daily.      . Loratadine (CLARITIN) 10 MG CAPS Take 1 capsule by mouth as needed.       .Marland Kitchenlosartan (COZAAR) 50 MG tablet Take 1 tablet (50 mg total) by mouth daily.  30 tablet  6  . Macitentan 10 MG TABS Take 10 mg by mouth daily.      . Misc Natural Products (ALLERGY RELEAF SYSTEM PO) Take 1 tablet by mouth daily.      .Marland Kitchen  mometasone-formoterol (DULERA) 200-5 MCG/ACT AERO Inhale 2 puffs into the lungs 2 (two) times daily.  13 g  6  . omeprazole (PRILOSEC) 40 MG capsule Take 1 capsule (40 mg total) by mouth daily.  90 capsule  3  . potassium chloride (K-DUR) 10 MEQ tablet Take 2 tablets (20 mEq total) by mouth 2 (two) times daily.  120 tablet  6  . spironolactone (ALDACTONE) 50 MG tablet Take 1 tablet (50 mg total) by mouth daily.  30 tablet  11  . verapamil (VERELAN) 180 MG 24 hr capsule Take 1 capsule (180 mg total) by mouth 2 (two) times daily.  180 capsule  0  . Tadalafil, PAH, (ADCIRCA) 20 MG TABS Take 1 tablet (20 mg total) by mouth daily.  30 tablet  3   No current facility-administered medications for this visit.     Allergies as of 04/05/2013 - Review Complete 04/05/2013  Allergen Reaction Noted  . Aspirin Other (See Comments) 03/21/2011    Family History  Problem Relation Age of Onset  . Heart disease Mother   . Prostate cancer Father   . Colon cancer Neg Hx   . Heart attack Mother   . Hypertension Mother     History   Social History  . Marital Status: Divorced    Spouse Name: N/A    Number of Children: 3  . Years of Education: N/A   Occupational History  . Retired     Social research officer, government   Social History Main Topics  . Smoking status: Former Smoker -- 2.00 packs/day for 5 years    Types: Cigarettes    Quit date: 09/22/1979  . Smokeless tobacco: Never Used  . Alcohol Use: Yes     Comment: 3-4 cans of beers and 4-5 drinks of vodka - has not drunk any alcohol in the last few weeks  . Drug Use: No  . Sexually Active: Not on file   Other Topics Concern  . Not on file   Social History Narrative   0 caffeine drinks daily       Physical Exam: Ht 5' 11.25" (1.81 m)  Wt 202 lb (91.627 kg)  BMI 27.97 kg/m2 Constitutional: generally well-appearing Psychiatric: alert and oriented x3 Abdomen: soft, nontender, nondistended, no obvious ascites, no peritoneal signs, normal bowel sounds     Assessment and plan: 62 y.o. male with severe pulmonary hypertension, cirrhosis from a combination of alcohol and cardiogenic cause  He looks overall good in the office today. Control of his diuretic regimen has been assumed by cardiology. Previously there were 3 of [place] adjusting his diuretics and we all three agreed it would be better managed by 1.  He is going to return to see me in 2 months and sooner if needed.

## 2013-04-05 NOTE — Patient Instructions (Addendum)
Please return to see Dr. Ardis Hughs in 8-9 weeks.

## 2013-04-19 ENCOUNTER — Ambulatory Visit (INDEPENDENT_AMBULATORY_CARE_PROVIDER_SITE_OTHER): Admitting: Family Medicine

## 2013-04-19 ENCOUNTER — Encounter: Payer: Self-pay | Admitting: Family Medicine

## 2013-04-19 VITALS — BP 112/62 | HR 94 | Temp 97.5°F | Ht 72.0 in

## 2013-04-19 DIAGNOSIS — I10 Essential (primary) hypertension: Secondary | ICD-10-CM

## 2013-04-19 DIAGNOSIS — IMO0002 Reserved for concepts with insufficient information to code with codable children: Secondary | ICD-10-CM

## 2013-04-19 DIAGNOSIS — I2789 Other specified pulmonary heart diseases: Secondary | ICD-10-CM

## 2013-04-19 DIAGNOSIS — G4700 Insomnia, unspecified: Secondary | ICD-10-CM

## 2013-04-19 LAB — LIPID PANEL
Cholesterol: 174 mg/dL (ref 0–200)
HDL: 54 mg/dL (ref >39)
Total CHOL/HDL Ratio: 3.2 Ratio
Triglycerides: 274 mg/dL — ABNORMAL HIGH (ref <150)
VLDL: 55 mg/dL — ABNORMAL HIGH (ref 0–40)

## 2013-04-19 LAB — RENAL FUNCTION PANEL
BUN: 26 mg/dL — ABNORMAL HIGH (ref 6–23)
Calcium: 8.8 mg/dL (ref 8.4–10.5)
Creat: 1.57 mg/dL — ABNORMAL HIGH (ref 0.50–1.35)
Glucose, Bld: 87 mg/dL (ref 70–99)
Phosphorus: 3.3 mg/dL (ref 2.3–4.6)
Potassium: 3.9 mEq/L (ref 3.5–5.3)

## 2013-04-19 LAB — HEPATIC FUNCTION PANEL
AST: 38 U/L — ABNORMAL HIGH (ref 0–37)
Albumin: 3.8 g/dL (ref 3.5–5.2)
Alkaline Phosphatase: 142 U/L — ABNORMAL HIGH (ref 39–117)
Total Bilirubin: 0.6 mg/dL (ref 0.3–1.2)
Total Protein: 7 g/dL (ref 6.0–8.3)

## 2013-04-19 LAB — TSH: TSH: 3.229 u[IU]/mL (ref 0.350–4.500)

## 2013-04-19 LAB — CBC
Hemoglobin: 11.7 g/dL — ABNORMAL LOW (ref 13.0–17.0)
MCH: 34.9 pg — ABNORMAL HIGH (ref 26.0–34.0)
MCHC: 33.6 g/dL (ref 30.0–36.0)
RDW: 14.8 % (ref 11.5–15.5)

## 2013-04-19 MED ORDER — ZOLPIDEM TARTRATE 10 MG PO TABS: 10.0000 mg | ORAL_TABLET | Freq: Every evening | ORAL | Status: DC | PRN

## 2013-04-19 NOTE — Patient Instructions (Addendum)

## 2013-04-20 ENCOUNTER — Encounter: Payer: Self-pay | Admitting: Family Medicine

## 2013-04-20 NOTE — Assessment & Plan Note (Signed)
Well controlled at today's visit.

## 2013-04-20 NOTE — Assessment & Plan Note (Signed)
Follows closely with pulmonary, symptomatically stable

## 2013-04-20 NOTE — Progress Notes (Signed)
Patient ID: Darren Mueller, male   DOB: 06/16/1951, 62 y.o.   MRN: [id number] Darren Mueller [id number] 05-24-1951 04/20/2013      Progress Note-Follow Up  Subjective  Chief Complaint  Chief Complaint  Patient presents with  . Follow-up    6 week     HPI  Patient is 62 yo AA male in today for follow up. Doing fairly well. Is following closely with pulmonology. Has been started on Dulera. No significant change in symptoms. He continues to struggle with fatigue and has trouble sleeping. Is not responding to doxepin. Has responded to Ambien in the past. No recent illness. No chest pain, palpitation, GI or GU complaints. No recent illness, headaches.  Past Medical History  Diagnosis Date  . Hypertension   . Seizures   . Hx of colonic polyps   . GERD (gastroesophageal reflux disease)   . Gout   . Alcoholic cirrhosis of liver with ascites 2014  . Unspecified hypothyroidism 01/25/2013  . Unspecified pleural effusion   . Other chronic pulmonary heart diseases   . Hypopotassemia     History reviewed. No pertinent past surgical history.  Family History  Problem Relation Age of Onset  . Heart disease Mother   . Prostate cancer Father   . Colon cancer Neg Hx   . Heart attack Mother   . Hypertension Mother     History   Social History  . Marital Status: Divorced    Spouse Name: N/A    Number of Children: 3  . Years of Education: N/A   Occupational History  . Retired     Social research officer, government   Social History Main Topics  . Smoking status: Former Smoker -- 2.00 packs/day for 5 years    Types: Cigarettes    Quit date: 09/22/1979  . Smokeless tobacco: Never Used  . Alcohol Use: Yes     Comment: 3-4 cans of beers and 4-5 drinks of vodka - has not drunk any alcohol in the last few weeks  . Drug Use: No  . Sexually Active: Not on file   Other Topics Concern  . Not on file   Social History Narrative   0 caffeine drinks daily     Current Outpatient Prescriptions on File Prior to  Visit  Medication Sig Dispense Refill  . allopurinol (ZYLOPRIM) 100 MG tablet Take 1 tablet (100 mg total) by mouth daily.  90 tablet  1  . beclomethasone (QVAR) 80 MCG/ACT inhaler Inhale 2 puffs into the lungs 2 (two) times daily.      . furosemide (LASIX) 40 MG tablet Take 40 mg by mouth daily.      . Homeopathic Products (SIMILASAN EARACHE RELIEF OT) Place in ear(s) daily.      . Ibuprofen-Diphenhydramine Cit (ADVIL PM PO) Take 1 tablet by mouth at bedtime as needed.      Marland Kitchen levothyroxine (LEVOTHROID) 25 MCG tablet Take 1 tablet (25 mcg total) by mouth daily.  30 tablet  3  . lisinopril (PRINIVIL,ZESTRIL) 10 MG tablet Take 10 mg by mouth daily.      . Loratadine (CLARITIN) 10 MG CAPS Take 1 capsule by mouth as needed.       Marland Kitchen losartan (COZAAR) 50 MG tablet Take 1 tablet (50 mg total) by mouth daily.  30 tablet  6  . Macitentan 10 MG TABS Take 10 mg by mouth daily.      . Misc Natural Products (ALLERGY RELEAF SYSTEM PO) Take 1 tablet by mouth daily.      Marland Kitchen  mometasone-formoterol (DULERA) 200-5 MCG/ACT AERO Inhale 2 puffs into the lungs 2 (two) times daily.  13 g  6  . omeprazole (PRILOSEC) 40 MG capsule Take 1 capsule (40 mg total) by mouth daily.  90 capsule  3  . potassium chloride (K-DUR) 10 MEQ tablet Take 2 tablets (20 mEq total) by mouth 2 (two) times daily.  120 tablet  6  . spironolactone (ALDACTONE) 50 MG tablet Take 1 tablet (50 mg total) by mouth daily.  30 tablet  11  . Tadalafil, PAH, (ADCIRCA) 20 MG TABS Take 1 tablet (20 mg total) by mouth daily.  30 tablet  3  . verapamil (VERELAN) 180 MG 24 hr capsule Take 1 capsule (180 mg total) by mouth 2 (two) times daily.  180 capsule  0   No current facility-administered medications on file prior to visit.    Allergies  Allergen Reactions  . Aspirin Other (See Comments)    hypertension    Review of Systems  Review of Systems  Constitutional: Negative for fever and malaise/fatigue.  HENT: Negative for congestion.   Eyes:  Negative for pain and discharge.  Respiratory: Positive for shortness of breath.   Cardiovascular: Negative for chest pain, palpitations and leg swelling.  Gastrointestinal: Negative for nausea, abdominal pain and diarrhea.  Genitourinary: Negative for dysuria.  Musculoskeletal: Negative for falls.  Skin: Negative for rash.  Neurological: Negative for loss of consciousness and headaches.  Endo/Heme/Allergies: Negative for polydipsia.  Psychiatric/Behavioral: Negative for depression and suicidal ideas. The patient is not nervous/anxious and does not have insomnia.     Objective  BP 112/62  Pulse 94  Temp(Src) 97.5 F (36.4 C) (Oral)  Ht 6' (1.829 m)  SpO2 93%  Physical Exam  Physical Exam  Constitutional: He is oriented to person, place, and time and well-developed, well-nourished, and in no distress. No distress.  HENT:  Head: Normocephalic and atraumatic.  Eyes: Conjunctivae are normal.  Neck: Neck supple. No thyromegaly present.  Cardiovascular: Normal rate and regular rhythm.  Exam reveals no gallop.   No murmur heard. Pulmonary/Chest: Effort normal and breath sounds normal. No respiratory distress.  Abdominal: He exhibits no distension and no mass. There is no tenderness.  Musculoskeletal: He exhibits no edema.  Neurological: He is alert and oriented to person, place, and time.  Skin: Skin is warm.  Psychiatric: Memory, affect and judgment normal.    Lab Results  Component Value Date   TSH 3.229 04/19/2013   Lab Results  Component Value Date   WBC 3.0* 04/19/2013   HGB 11.7* 04/19/2013   HCT 34.8* 04/19/2013   MCV 103.9* 04/19/2013   PLT 70* 04/19/2013   Lab Results  Component Value Date   CREATININE 1.57* 04/19/2013   BUN 26* 04/19/2013   NA 138 04/19/2013   K 3.9 04/19/2013   CL 109 04/19/2013   CO2 19 04/19/2013   Lab Results  Component Value Date   ALT 19 04/19/2013   AST 38* 04/19/2013   ALKPHOS 142* 04/19/2013   BILITOT 0.6 04/19/2013   Lab Results   Component Value Date   CHOL 174 04/19/2013   Lab Results  Component Value Date   HDL 54 04/19/2013   Lab Results  Component Value Date   LDLCALC 65 04/19/2013   Lab Results  Component Value Date   TRIG 274* 04/19/2013   Lab Results  Component Value Date   CHOLHDL 3.2 04/19/2013     Assessment & Plan  Hypertension Well controlled at today's  visit.  Insomnia Not responding to Doxepin but has done well on Ambien in past so given rx today  Secondary pulmonary hypertension Follows closely with pulmonary, symptomatically stable

## 2013-04-20 NOTE — Assessment & Plan Note (Signed)
Not responding to Doxepin but has done well on Ambien in past so given rx today

## 2013-04-21 ENCOUNTER — Ambulatory Visit (INDEPENDENT_AMBULATORY_CARE_PROVIDER_SITE_OTHER): Admitting: Critical Care Medicine

## 2013-04-21 ENCOUNTER — Encounter: Payer: Self-pay | Admitting: Critical Care Medicine

## 2013-04-21 VITALS — BP 120/80 | HR 85 | Temp 97.8°F | Ht 72.0 in | Wt 205.0 lb

## 2013-04-21 DIAGNOSIS — J449 Chronic obstructive pulmonary disease, unspecified: Secondary | ICD-10-CM

## 2013-04-21 DIAGNOSIS — IMO0002 Reserved for concepts with insufficient information to code with codable children: Secondary | ICD-10-CM

## 2013-04-21 DIAGNOSIS — I2789 Other specified pulmonary heart diseases: Secondary | ICD-10-CM

## 2013-04-21 MED ORDER — LOSARTAN POTASSIUM 100 MG PO TABS: 100.0000 mg | ORAL_TABLET | Freq: Every day | ORAL | Status: DC

## 2013-04-21 MED ORDER — LOSARTAN POTASSIUM 100 MG PO TABS: 50.0000 mg | ORAL_TABLET | Freq: Every day | ORAL | Status: DC

## 2013-04-21 MED ORDER — FUROSEMIDE 40 MG PO TABS: 40.0000 mg | ORAL_TABLET | Freq: Every day | ORAL | Status: DC

## 2013-04-21 NOTE — Assessment & Plan Note (Signed)
Copd Gold C with improvement in lung function with BD therapy Plan Stop lisinopril Increase losartan to 182m daily (can use two 564mper day until you get refills on 1001mhich will be one daily) Lasix refilled Stop Qvar Stay on Dulera No other changes Oxygen as needed Return 4 months

## 2013-04-21 NOTE — Progress Notes (Signed)
Subjective:    Patient ID: Darren Mueller, male    DOB: Oct 10, 1951, 62 y.o.   MRN: [id number]  HPI  Darren Mueller is a 46 former smoker year-old male with a history of cirrhosis felt secondary to alcohol (Noted to have varices in 2010 on routine EGD). Seen for initial pulmonary consult 12/09/12 d/t mod right pleural effusion -Transudative -s/p thoracentesis  W/ 1.5 L removed.   02/17/2013 Rov for PUlm HTN d/t liver dz, copd, hypoxemia, chr resp failure, recurrent pleural effusions d/t liver dz. At 3/11 OV with NP changed Qvar to [facility] for BD properties.  Followed in CHF clinic and started on macitantan See below: Underwent RHC 12/31/12. It confirmed the diagnosis of PAH. He was felt to have porto-pulmonary HTN and started on macitentan 01/18/2013.  RA = 13  RV = 63/5/14  PA = 65/24 (41)  PCW = 6  Hepatic wedge = 21  Fick cardiac output/index = 3.8/1.8  PVR = 9.1  FA sat = 94%  PA sat = 58%, 56%  SVC = 54%  6 min walk 01/10/13, pt ambulated 1290 feet, his O2 sat ranged from 90-98% on RA   Pt back on lasix and aldactone. (had been taken off lasix and left on aldactone per GI)  ? Of revatio? But only on macitantan for 3 weeks as of yet.   Pt still is dyspneic with exertion. Has to pace self. Has to stop with walking.  No real cough.  No wheeze.  No chest pain .  Weight is up again #203 from 180#   04/21/2013 Chief Complaint  Patient presents with  . Follow-up    Breathing has been doing well. Denies any problems with SOB, coughing, chest tightness or wheezing.   Seems about the same to sl better. On new Pulm HTN med No cough.  No chest pain. Legs swelling is better.  No alcohol!!   Review of Systems  Constitutional:   No  weight loss, night sweats,  Fevers, chills, fatigue, or  lassitude.  HEENT:   No headaches,  Difficulty swallowing,  Tooth/dental problems, or  Sore throat,                No sneezing, itching, ear ache, +nasal congestion, post nasal drip,   CV:  No chest  pain,  Orthopnea, PND,  anasarca, dizziness, palpitations, syncope.  +periph edema  GI  No heartburn, indigestion, abdominal pain, nausea, vomiting, diarrhea, change in bowel habits, loss of appetite, bloody stools.   Resp:   No excess mucus, no productive cough,  No non-productive cough,  No coughing up of blood.  No change in color of mucus.  No wheezing.  No chest wall deformity  Skin: no rash or lesions.  GU: no dysuria, change in color of urine, no urgency or frequency.  No flank pain, no hematuria   MS:  No joint pain or swelling.  No decreased range of motion.  No back pain.  Psych:  No change in mood or affect. No depression or anxiety.  No memory loss.     Objective:   Physical Exam BP 120/80  Pulse 85  Temp(Src) 97.8 F (36.6 C) (Oral)  Ht 6' (1.829 m)  Wt 92.987 kg (205 lb)  BMI 27.8 kg/m2  SpO2 98%  GEN: A/Ox3; pleasant , NAD, well nourished   HEENT:  [place]/AT,  EACs-clear, TMs-wnl, NOSE-clear drainage  THROAT-clear, no lesions, no postnasal drip or exudate noted.   NECK:  Supple w/ fair ROM; no  JVD; normal carotid impulses w/o bruits; no thyromegaly or nodules palpated; no lymphadenopathy.  RESP  Clear  P & A; w/o, wheezes/ rales/ or rhonchi.no accessory muscle use, no dullness to percussion  CARD:  RRR, no m/r/g  , 1+peripheral edema, pulses intact, no cyanosis or clubbing.  GI:   Soft & nt; nml bowel sounds; no organomegaly or masses detected.  Musco: Warm bil, no deformities or joint swelling noted.   Neuro: alert, no focal deficits noted.    Skin: Warm, no lesions or rashes     Assessment & Plan:   Obstructive chronic bronchitis without exacerbation Copd Gold C with improvement in lung function with BD therapy Plan Stop lisinopril Increase losartan to 190m daily (can use two 520mper day until you get refills on 10029mhich will be one daily) Lasix refilled Stop Qvar Stay on Dulera No other changes Oxygen as needed Return 4 months   Secondary  pulmonary hypertension pulm HTN improved on macitentan and now adcirca  Needs cont lasix Needs oxygen only prn. recent 6mw72ms improved    Updated Medication List Outpatient Encounter Prescriptions as of 04/21/2013  Medication Sig Dispense Refill  . allopurinol (ZYLOPRIM) 100 MG tablet Take 1 tablet (100 mg total) by mouth daily.  90 tablet  1  . furosemide (LASIX) 40 MG tablet Take 1 tablet (40 mg total) by mouth daily.  30 tablet  6  . Homeopathic Products (SIMILASAN EARACHE RELIEF OT) Place in ear(s) daily.      . Ibuprofen-Diphenhydramine Cit (ADVIL PM PO) Take 1 tablet by mouth at bedtime as needed.      . leMarland Kitchenothyroxine (LEVOTHROID) 25 MCG tablet Take 1 tablet (25 mcg total) by mouth daily.  30 tablet  3  . Loratadine (CLARITIN) 10 MG CAPS Take 1 capsule by mouth as needed.       . loMarland Kitchenartan (COZAAR) 100 MG tablet Take 1 tablet (100 mg total) by mouth daily.  30 tablet  6  . Macitentan 10 MG TABS Take 10 mg by mouth daily.      . Misc Natural Products (ALLERGY RELEAF SYSTEM PO) Take 1 tablet by mouth daily.      . mometasone-formoterol (DULERA) 200-5 MCG/ACT AERO Inhale 2 puffs into the lungs 2 (two) times daily.  13 g  6  . omeprazole (PRILOSEC) 40 MG capsule Take 1 capsule (40 mg total) by mouth daily.  90 capsule  3  . potassium chloride (K-DUR) 10 MEQ tablet Take 2 tablets (20 mEq total) by mouth 2 (two) times daily.  120 tablet  6  . spironolactone (ALDACTONE) 50 MG tablet Take 1 tablet (50 mg total) by mouth daily.  30 tablet  11  . Tadalafil, PAH, (ADCIRCA) 20 MG TABS Take 1 tablet (20 mg total) by mouth daily.  30 tablet  3  . verapamil (VERELAN) 180 MG 24 hr capsule Take 1 capsule (180 mg total) by mouth 2 (two) times daily.  180 capsule  0  . zolpidem (AMBIEN) 10 MG tablet Take 1 tablet (10 mg total) by mouth at bedtime as needed for sleep.  30 tablet  2  . [DISCONTINUED] beclomethasone (QVAR) 80 MCG/ACT inhaler Inhale 2 puffs into the lungs 2 (two) times daily.      .  [DISCONTINUED] furosemide (LASIX) 40 MG tablet Take 40 mg by mouth daily.      . [DISCONTINUED] lisinopril (PRINIVIL,ZESTRIL) 10 MG tablet Take 10 mg by mouth daily.      . [DISCONTINUED] losartan (  COZAAR) 100 MG tablet Take 0.5 tablets (50 mg total) by mouth daily.  30 tablet  6  . [DISCONTINUED] losartan (COZAAR) 50 MG tablet Take 1 tablet (50 mg total) by mouth daily.  30 tablet  6   No facility-administered encounter medications on file as of 04/21/2013.

## 2013-04-21 NOTE — Assessment & Plan Note (Signed)
pulm HTN improved on macitentan and now adcirca  Needs cont lasix Needs oxygen only prn. recent 77m was improved

## 2013-04-21 NOTE — Patient Instructions (Addendum)
Stop lisinopril Increase losartan to 138m daily (can use two 524mper day until you get refills on 10031mhich will be one daily) Lasix refilled Stop Qvar Stay on Dulera No other changes Oxygen as needed Return 4 months

## 2013-04-25 ENCOUNTER — Encounter (HOSPITAL_COMMUNITY): Payer: Self-pay

## 2013-04-25 ENCOUNTER — Ambulatory Visit (HOSPITAL_COMMUNITY)
Admission: RE | Admit: 2013-04-25 | Discharge: 2013-04-25 | Disposition: A | Discharge status: Home / Self Care | Source: Ambulatory Visit | Attending: Internal Medicine | Admitting: Internal Medicine

## 2013-04-25 VITALS — BP 118/62 | HR 86 | Wt 204.0 lb

## 2013-04-25 DIAGNOSIS — I10 Essential (primary) hypertension: Secondary | ICD-10-CM | POA: Insufficient documentation

## 2013-04-25 DIAGNOSIS — Z860100 Personal history of colon polyps, unspecified: Secondary | ICD-10-CM | POA: Insufficient documentation

## 2013-04-25 DIAGNOSIS — Z79899 Other long term (current) drug therapy: Secondary | ICD-10-CM | POA: Insufficient documentation

## 2013-04-25 DIAGNOSIS — F1020 Alcohol dependence, uncomplicated: Secondary | ICD-10-CM | POA: Insufficient documentation

## 2013-04-25 DIAGNOSIS — K7030 Alcoholic cirrhosis of liver without ascites: Secondary | ICD-10-CM | POA: Insufficient documentation

## 2013-04-25 DIAGNOSIS — I2721 Secondary pulmonary arterial hypertension: Secondary | ICD-10-CM

## 2013-04-25 DIAGNOSIS — I279 Pulmonary heart disease, unspecified: Secondary | ICD-10-CM | POA: Insufficient documentation

## 2013-04-25 DIAGNOSIS — I2789 Other specified pulmonary heart diseases: Secondary | ICD-10-CM

## 2013-04-25 DIAGNOSIS — M109 Gout, unspecified: Secondary | ICD-10-CM | POA: Insufficient documentation

## 2013-04-25 DIAGNOSIS — K219 Gastro-esophageal reflux disease without esophagitis: Secondary | ICD-10-CM | POA: Insufficient documentation

## 2013-04-25 DIAGNOSIS — E039 Hypothyroidism, unspecified: Secondary | ICD-10-CM | POA: Insufficient documentation

## 2013-04-25 NOTE — Addendum Note (Signed)
Encounter addended by: Scarlette Calico, RN on: 04/25/2013 10:20 AM<BR>     Documentation filed: Patient Instructions Section

## 2013-04-25 NOTE — Progress Notes (Signed)
Patient ID: Darren Mueller, male   DOB: [birth year], 62 y.o.   MRN: [id number]   Weight Range   Baseline proBNP     HPI: Darren Mueller is a 62 year-old male with a history of COPD, cirrhosis felt secondary to alcohol (Noted to have varices in 2010 on routine EGD) and portopulmonary HTN.   He developed dyspnea on exertion in early 2014.  He was noted to have a right effusion and had thoracentesis which was transudative.   Echocardiogram in February 2014 showed an ejection fraction of 55-60%, moderate left ventricular hypertrophy, grade 1 diastolic dysfunction. The right atrium and right ventricle were severely enlarged and right ventricular function was severely reduced. There was mild aortic insufficiency, moderate tricuspid regurgitation and severe pulmonary hypertension. Abdominal ultrasound in January 2014 showed features consistent with cirrhosis and ascites. Chest CT in January of 2014 showed a right pleural effusion, probable cirrhosis and an 63m pulmonary nodule. There is atherosclerosis noted in the coronary arteries.   Underwent RHC 12/31/12. It confirmed the diagnosis of PAH. He was felt to have porto-pulmonary HTN and started on macitentan 01/18/2013.  RA = 13  RV = 63/5/14  PA = 65/24 (41)  PCW = 6  Hepatic wedge = 21  Fick cardiac output/index = 3.8/1.8  PVR = 9.1  FA sat = 94%  PA sat = 58%, 56%  SVC = 54%  6 min walk 01/10/13, pt ambulated 1290 feet, his O2 sat ranged from 90-98% on RA 6 MW today (03/24/13) 1330 feet. O2 > 90% on ear probe no CP   Echo 5/14 with EF 55% with severe RV dilation/dysfunction. Mild TR PAP 65-70 TAPSE 1.1   He returns for follow up. He saw Dr. WJoya Gaskinslast week. Lisinopril stopped and losartan uptitrated. Remains on Macitentan (started in 3/14). Two weeks ago started Adcirca now taking 40 daily. Feels like functional capacity is improving slowly. Weight stable at 203-204. No dizziness.  CP resolved. When walking in store stops after every 2-3 aisles.  Still drinking on fairly regular basis. Recent Cr stable at 1.5.   ROS: All systems negative except as listed in HPI, PMH and Problem List.  Past Medical History  Diagnosis Date  . Hypertension   . Seizures   . Hx of colonic polyps   . GERD (gastroesophageal reflux disease)   . Gout   . Alcoholic cirrhosis of liver with ascites 2014  . Unspecified hypothyroidism 01/25/2013  . Unspecified pleural effusion   . Other chronic pulmonary heart diseases   . Hypopotassemia     Current Outpatient Prescriptions  Medication Sig Dispense Refill  . allopurinol (ZYLOPRIM) 100 MG tablet Take 1 tablet (100 mg total) by mouth daily.  90 tablet  1  . furosemide (LASIX) 40 MG tablet Take 1 tablet (40 mg total) by mouth daily.  30 tablet  6  . Homeopathic Products (SIMILASAN EARACHE RELIEF OT) Place in ear(s) daily.      . Ibuprofen-Diphenhydramine Cit (ADVIL PM PO) Take 1 tablet by mouth at bedtime as needed.      .Marland Kitchenlevothyroxine (LEVOTHROID) 25 MCG tablet Take 1 tablet (25 mcg total) by mouth daily.  30 tablet  3  . Loratadine (CLARITIN) 10 MG CAPS Take 1 capsule by mouth as needed.       .Marland Kitchenlosartan (COZAAR) 100 MG tablet Take 1 tablet (100 mg total) by mouth daily.  30 tablet  6  . Macitentan 10 MG TABS Take 10 mg by mouth daily.      .Marland Kitchen  Misc Natural Products (ALLERGY RELEAF SYSTEM PO) Take 1 tablet by mouth daily.      . mometasone-formoterol (DULERA) 200-5 MCG/ACT AERO Inhale 2 puffs into the lungs 2 (two) times daily.  13 g  6  . omeprazole (PRILOSEC) 40 MG capsule Take 1 capsule (40 mg total) by mouth daily.  90 capsule  3  . potassium chloride (K-DUR) 10 MEQ tablet Take 2 tablets (20 mEq total) by mouth 2 (two) times daily.  120 tablet  6  . spironolactone (ALDACTONE) 50 MG tablet Take 1 tablet (50 mg total) by mouth daily.  30 tablet  11  . Tadalafil, PAH, (ADCIRCA) 20 MG TABS Take 1 tablet (20 mg total) by mouth daily.  30 tablet  3  . verapamil (VERELAN) 180 MG 24 hr capsule Take 1 capsule  (180 mg total) by mouth 2 (two) times daily.  180 capsule  0  . zolpidem (AMBIEN) 10 MG tablet Take 1 tablet (10 mg total) by mouth at bedtime as needed for sleep.  30 tablet  2   No current facility-administered medications for this encounter.     PHYSICAL EXAM: Filed Vitals:   04/25/13 0941  BP: 118/62  Pulse: 86  Weight: 204 lb (92.534 kg)  SpO2: 96%    General:  Well appearing. No resp difficulty. Brother present  HEENT: normal Neck: supple. JVP 7 Carotids 2+ bilaterally; no bruits. No lymphadenopathy or thryomegaly appreciated. Cor: PMI normal. +RV lift Regular rate & rhythm. No rubs, gallops . 2/6TR Lungs: clear Abdomen: soft, nontender, nondistended. No hepatosplenomegaly. No bruits or masses. Good bowel sounds. Extremities: no cyanosis, clubbing, rash, no edema Neuro: alert & orientedx3, cranial nerves grossly intact. Moves all 4 extremities w/o difficulty. Affect pleasant.   ASSESSMENT & PLAN:

## 2013-04-25 NOTE — Patient Instructions (Addendum)
Your physician recommends that you schedule a follow-up appointment in: 6 weeks

## 2013-04-25 NOTE — Assessment & Plan Note (Signed)
Improving on combination therapy. Now NYHA III. Volume status looks good. Will continue current regimen. In future will likely need to consider inhaled prostanoid like Tyvaso. Reinforced need for daily weights and reviewed use of sliding scale diuretics. Reminded him of the need for complete ETOH abstinence.

## 2013-05-02 ENCOUNTER — Other Ambulatory Visit: Payer: Self-pay | Admitting: Internal Medicine

## 2013-05-02 NOTE — Telephone Encounter (Signed)
Drug Interaction Warning: Spironolactone & Lisinopril Significance: Major Warning: Hyperkalemia, possibly with cardiac arrhythmias or arrest, may occur with the combination of spironolactone and lisinopril. Serum potassium concentrations and renal function should be monitored. Patients with renal impairment, diabetes, older age, severe heart failure, and risk for dehydration may be at greater risk. Drug Interaction Warning: Losartan & Lisinopril Significance: Major Warning: Coadministration of losartan and lisinopril may be associated with an increased risk of renal dysfunction and hyperkalemia. (In the case of one potential pair of ACE Inhibitors and Angiotensin II Receptor blockers, official package labeling for telmisartan suggests avoidance of its use with ramipril.) One large study indicates that combination use of ACEIs and ARBs may result in higher levels of signficant adverse effects without additional therapeutic benefit.  Pulmonary Note: 06.19.14 Assessment & Plan:   Obstructive chronic bronchitis without exacerbation  Copd Gold C with improvement in lung function with BD therapy  Plan  Stop lisinopril Increase losartan to 137m daily (can use two 556mper day until you get refills on 10055mhich will be one daily)  Lasix refilled  Stop Qvar  Stay on Dulera  No other changes  Oxygen as needed  Return 4 months   DENIED per notations above/SLS

## 2013-05-04 ENCOUNTER — Other Ambulatory Visit: Payer: Self-pay

## 2013-05-04 MED ORDER — VERAPAMIL HCL ER 180 MG PO CP24: 180.0000 mg | ORAL_CAPSULE | Freq: Two times a day (BID) | ORAL | Status: DC

## 2013-06-03 ENCOUNTER — Encounter: Payer: Self-pay | Admitting: Gastroenterology

## 2013-06-03 ENCOUNTER — Encounter

## 2013-06-03 ENCOUNTER — Ambulatory Visit (INDEPENDENT_AMBULATORY_CARE_PROVIDER_SITE_OTHER): Admitting: Gastroenterology

## 2013-06-03 VITALS — BP 110/66 | HR 64 | Ht 72.0 in | Wt 200.6 lb

## 2013-06-03 DIAGNOSIS — K7460 Unspecified cirrhosis of liver: Secondary | ICD-10-CM

## 2013-06-03 DIAGNOSIS — Z23 Encounter for immunization: Secondary | ICD-10-CM

## 2013-06-03 NOTE — Progress Notes (Signed)
Review of pertinent gastrointestinal problems:  1. history of adenomatous colon polyps : Colonoscopy 12/10: "Two sessile polyps were found in hepatic flexure (8-67m), piecemeal resected and then tatoo'd with INigerInk" path showed "moderate to severe dysplasia" and he was recommended to have repeat examination at 3 month interval. Colonoscopy December 2012 found 4 polyps, one was piecemeal removed, adenoma. He was recommended to have repeat at 6 month recall. With pulm HTN, active, dyspnea this was changed to 04/2014. 2. incidentally noted esophageal varices on 2012 EGD done for dysphasia. 2012 Abdominal ultrasound showed a normal liver without clear sign of cirrhosis. Was told to cut back on drinking.  3. 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 From chronic etoh over use. Labs 12/12 Hep A Ab +, Hep B S ag and Ab negative, Hep C Ab neg; ANA, AIA, AMA, ceruloplasm, iron testing all neg;  Last liver imaging: 12/12 [place] clear cirrhosis, no focal lesions  Last AFP: 12/2012 normal  Last EGD: 2012 small esophageal varices  Hep B immunization series started 11/2012  4. 2014 right heart angiogram suggested severe right heart failure, pulmonary hypertension started on macitentan 01/18/2013. Followed closely by CHF clinic who has assumed lead role in managing diuretics.   HPI: This is a  very pleasant 62year old man whom I last saw about 2 months ago.  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  Home oxygen only.  He knows that CHF clinic is in charge  Has several loose stools per day, usually after eating.  Never has bleeding  Past Medical History  Diagnosis Date  . Hypertension   . Seizures   . Hx of colonic polyps   . GERD (gastroesophageal reflux disease)   . Gout   . Alcoholic cirrhosis of liver with ascites 2014  . Unspecified hypothyroidism 01/25/2013  .  Unspecified pleural effusion   . Other chronic pulmonary heart diseases   . Hypopotassemia     History reviewed. No pertinent past surgical history.  Current Outpatient Prescriptions  Medication Sig Dispense Refill  . allopurinol (ZYLOPRIM) 100 MG tablet Take 1 tablet (100 mg total) by mouth daily.  90 tablet  1  . furosemide (LASIX) 40 MG tablet Take 1 tablet (40 mg total) by mouth daily.  30 tablet  6  . Homeopathic Products (SIMILASAN EARACHE RELIEF OT) Place in ear(s) daily.      .Marland Kitchenlevothyroxine (LEVOTHROID) 25 MCG tablet Take 1 tablet (25 mcg total) by mouth daily.  30 tablet  3  . Loratadine (CLARITIN) 10 MG CAPS Take 1 capsule by mouth as needed.       .Marland Kitchenlosartan (COZAAR) 100 MG tablet Take 1 tablet (100 mg total) by mouth daily.  30 tablet  6  . Macitentan 10 MG TABS Take 10 mg by mouth daily.      . Misc Natural Products (ALLERGY RELEAF SYSTEM PO) Take 1 tablet by mouth daily.      . mometasone-formoterol (DULERA) 200-5 MCG/ACT AERO Inhale 2 puffs into the lungs 2 (two) times daily.  13 g  6  . omeprazole (PRILOSEC) 40 MG capsule Take 1 capsule (40 mg total) by mouth daily.  90 capsule  3  . potassium chloride (K-DUR) 10 MEQ tablet Take 2 tablets (20 mEq total) by mouth 2 (two) times daily.  120 tablet  6  . spironolactone (  ALDACTONE) 50 MG tablet Take 1 tablet (50 mg total) by mouth daily.  30 tablet  11  . Tadalafil, PAH, (ADCIRCA) 20 MG TABS Take 1 tablet (20 mg total) by mouth daily.  30 tablet  3  . verapamil (VERELAN) 180 MG 24 hr capsule Take 1 capsule (180 mg total) by mouth 2 (two) times daily.  180 capsule  0  . zolpidem (AMBIEN) 10 MG tablet Take 1 tablet (10 mg total) by mouth at bedtime as needed for sleep.  30 tablet  2   No current facility-administered medications for this visit.    Allergies as of 06/03/2013 - Review Complete 06/03/2013  Allergen Reaction Noted  . Aspirin Other (See Comments) 03/21/2011    Family History  Problem Relation Age of Onset  .  Heart disease Mother   . Prostate cancer Father   . Colon cancer Neg Hx   . Heart attack Mother   . Hypertension Mother     History   Social History  . Marital Status: Divorced    Spouse Name: N/A    Number of Children: 3  . Years of Education: N/A   Occupational History  . Retired     Social research officer, government   Social History Main Topics  . Smoking status: Former Smoker -- 2.00 packs/day for 5 years    Types: Cigarettes    Quit date: 09/22/1979  . Smokeless tobacco: Never Used  . Alcohol Use: Yes     Comment: 3-4 cans of beers and 4-5 drinks of vodka - has not drunk any alcohol in the last few weeks  . Drug Use: No  . Sexually Active: Not on file   Other Topics Concern  . Not on file   Social History Narrative   0 caffeine drinks daily       Physical Exam: BP 110/66  Pulse 64  Ht 6' (1.829 m)  Wt 200 lb 9.6 oz (90.992 kg)  BMI 27.2 kg/m2 Constitutional: generally well-appearing Psychiatric: alert and oriented x3 Abdomen: soft, nontender, nondistended, no obvious ascites, no peritoneal signs, normal bowel sounds No lower extremity edema    Assessment and plan: 62 y.o. male with cirrhosis, pulmonary hypertension, personal history of adenomatous colon polyps  First we reviewed his polyp history. His last colonoscopy was December 2012. One of the polyps required piecemeal resection. Recommended at that time he have repeat examination 6 months later however his pulmonary hypertension intervene. I am moving his recall date 2 mid 2015 perhaps he will be in generally much better health by then and we'll consider repeat screening, surveillance examination around. He does have intermittent loose stools I recommended a trial of over-the-counter Imodium.Marland Kitchen

## 2013-06-03 NOTE — Patient Instructions (Addendum)
Start one imodium daily after waking.  Only stop if you become constipated. Will change recall colonoscopy for polyp surveillance to 04/2014. Please return to see Dr. Ardis Hughs in 3 months, sooner if needed.                                               We are excited to introduce MyChart, a new best-in-class service that provides you online access to important information in your electronic medical record. We want to make it easier for you to view your health information - all in one secure location - when and where you need it. We expect MyChart will enhance the quality of care and service we provide.  When you register for MyChart, you can:    View your test results.    Request appointments and receive appointment reminders via email.    Request medication renewals.    View your medical history, allergies, medications and immunizations.    Communicate with your physician's office through a password-protected site.    Conveniently print information such as your medication lists.  To find out if MyChart is right for you, please talk to a member of our clinical staff today. We will gladly answer your questions about this free health and wellness tool.  If you are age 63 or older and want a member of your family to have access to your record, you must provide written consent by completing a proxy form available at our office. Please speak to our clinical staff about guidelines regarding accounts for patients younger than age 63.  As you activate your MyChart account and need any technical assistance, please call the MyChart technical support line at [phone]) or email your question to [email or web address]. If you email your question(s), please include your name, a return phone number and the best time to reach you.  If you have non-urgent health-related questions, you can send a message to our office through [place] [facility] [email or web address]. If you have a medical  emergency, call 911.  Thank you for using MyChart as your new health and wellness resource!   MyChart licensed from [employer],  1999-2010. Patents Pending.

## 2013-06-06 ENCOUNTER — Ambulatory Visit (HOSPITAL_COMMUNITY)
Admission: RE | Admit: 2013-06-06 | Discharge: 2013-06-06 | Disposition: A | Discharge status: Home / Self Care | Source: Ambulatory Visit | Attending: Cardiology | Admitting: Cardiology

## 2013-06-06 ENCOUNTER — Encounter (HOSPITAL_COMMUNITY): Payer: Self-pay

## 2013-06-06 VITALS — BP 120/68 | HR 93 | Wt 200.1 lb

## 2013-06-06 DIAGNOSIS — I509 Heart failure, unspecified: Secondary | ICD-10-CM

## 2013-06-06 DIAGNOSIS — K766 Portal hypertension: Secondary | ICD-10-CM | POA: Insufficient documentation

## 2013-06-06 DIAGNOSIS — K7030 Alcoholic cirrhosis of liver without ascites: Secondary | ICD-10-CM | POA: Insufficient documentation

## 2013-06-06 DIAGNOSIS — K219 Gastro-esophageal reflux disease without esophagitis: Secondary | ICD-10-CM | POA: Insufficient documentation

## 2013-06-06 DIAGNOSIS — I10 Essential (primary) hypertension: Secondary | ICD-10-CM | POA: Insufficient documentation

## 2013-06-06 DIAGNOSIS — E039 Hypothyroidism, unspecified: Secondary | ICD-10-CM | POA: Insufficient documentation

## 2013-06-06 DIAGNOSIS — J90 Pleural effusion, not elsewhere classified: Secondary | ICD-10-CM | POA: Insufficient documentation

## 2013-06-06 DIAGNOSIS — I50810 Right heart failure, unspecified: Secondary | ICD-10-CM

## 2013-06-06 DIAGNOSIS — M109 Gout, unspecified: Secondary | ICD-10-CM | POA: Insufficient documentation

## 2013-06-06 DIAGNOSIS — Z79899 Other long term (current) drug therapy: Secondary | ICD-10-CM | POA: Insufficient documentation

## 2013-06-06 DIAGNOSIS — Z860100 Personal history of colon polyps, unspecified: Secondary | ICD-10-CM | POA: Insufficient documentation

## 2013-06-06 DIAGNOSIS — I2789 Other specified pulmonary heart diseases: Secondary | ICD-10-CM

## 2013-06-06 DIAGNOSIS — I2721 Secondary pulmonary arterial hypertension: Secondary | ICD-10-CM

## 2013-06-06 DIAGNOSIS — F1020 Alcohol dependence, uncomplicated: Secondary | ICD-10-CM

## 2013-06-06 NOTE — Patient Instructions (Addendum)
Doing well.  Can buy pulse oximeter to measure oxygen saturation when walking, want above 90%. If less than 90% need to wear oxygen.  Follow up 2 months with 6 min walk test.

## 2013-06-06 NOTE — Progress Notes (Signed)
Patient ID: Eshawn Coor, male   DOB: [birth year], 62 y.o.   MRN: [id number]  Weight Range   Baseline proBNP     HPI: Mr. Lafosse is a 62 year-old male with a history of COPD, cirrhosis felt secondary to alcohol (Noted to have varices in 2010 on routine EGD) and portopulmonary HTN.   He developed dyspnea on exertion in early 2014.  He was noted to have a right effusion and had thoracentesis which was transudative.   Echocardiogram in February 2014 showed an ejection fraction of 55-60%, moderate left ventricular hypertrophy, grade 1 diastolic dysfunction. The right atrium and right ventricle were severely enlarged and right ventricular function was severely reduced. There was mild aortic insufficiency, moderate tricuspid regurgitation and severe pulmonary hypertension. Abdominal ultrasound in January 2014 showed features consistent with cirrhosis and ascites. Chest CT in January of 2014 showed a right pleural effusion, probable cirrhosis and an 4m pulmonary nodule. There is atherosclerosis noted in the coronary arteries.   Underwent RHC 12/31/12. It confirmed the diagnosis of PAH. He was felt to have porto-pulmonary HTN and started on macitentan 01/18/2013.  RA = 13  RV = 63/5/14  PA = 65/24 (41)  PCW = 6  Hepatic wedge = 21  Fick cardiac output/index = 3.8/1.8  PVR = 9.1  FA sat = 94%  PA sat = 58%, 56%  SVC = 54%  6 min walk 01/10/13, pt ambulated 1290 feet, his O2 sat ranged from 90-98% on RA 6 MW today (03/24/13) 1330 feet. O2 > 90% on ear probe no CP   Echo 5/14 with EF 55% with severe RV dilation/dysfunction. Mild TR PAP 65-70 TAPSE 1.1   Follow up: Feeling well since last visit. On Macitentan and tadalafil. Symptomatically improved. Now NYHA III. Can walk around whole store if he takes his time. No presyncope/syncope. Swelling well controlled. Weight stable at 200. Not weighing at home much. Still with occasional ETOH use.   ROS: All systems negative except as listed in HPI, PMH  and Problem List.  Past Medical History  Diagnosis Date  . Hypertension   . Seizures   . Hx of colonic polyps   . GERD (gastroesophageal reflux disease)   . Gout   . Alcoholic cirrhosis of liver with ascites 2014  . Unspecified hypothyroidism 01/25/2013  . Unspecified pleural effusion   . Other chronic pulmonary heart diseases   . Hypopotassemia     Current Outpatient Prescriptions  Medication Sig Dispense Refill  . allopurinol (ZYLOPRIM) 100 MG tablet Take 1 tablet (100 mg total) by mouth daily.  90 tablet  1  . furosemide (LASIX) 40 MG tablet Take 1 tablet (40 mg total) by mouth daily.  30 tablet  6  . Homeopathic Products (SIMILASAN EARACHE RELIEF OT) Place in ear(s) daily.      .Marland Kitchenlevothyroxine (LEVOTHROID) 25 MCG tablet Take 1 tablet (25 mcg total) by mouth daily.  30 tablet  3  . Loratadine (CLARITIN) 10 MG CAPS Take 1 capsule by mouth as needed.       .Marland Kitchenlosartan (COZAAR) 100 MG tablet Take 1 tablet (100 mg total) by mouth daily.  30 tablet  6  . Macitentan 10 MG TABS Take 10 mg by mouth daily.      . Misc Natural Products (ALLERGY RELEAF SYSTEM PO) Take 1 tablet by mouth daily.      . mometasone-formoterol (DULERA) 200-5 MCG/ACT AERO Inhale 2 puffs into the lungs 2 (two) times daily.  13 g  6  .  omeprazole (PRILOSEC) 40 MG capsule Take 1 capsule (40 mg total) by mouth daily.  90 capsule  3  . potassium chloride (K-DUR) 10 MEQ tablet Take 2 tablets (20 mEq total) by mouth 2 (two) times daily.  120 tablet  6  . spironolactone (ALDACTONE) 50 MG tablet Take 1 tablet (50 mg total) by mouth daily.  30 tablet  11  . Tadalafil, PAH, (ADCIRCA) 20 MG TABS Take 1 tablet (20 mg total) by mouth daily.  30 tablet  3  . verapamil (VERELAN) 180 MG 24 hr capsule Take 1 capsule (180 mg total) by mouth 2 (two) times daily.  180 capsule  0  . zolpidem (AMBIEN) 10 MG tablet Take 1 tablet (10 mg total) by mouth at bedtime as needed for sleep.  30 tablet  2   No current facility-administered  medications for this encounter.     PHYSICAL EXAM: Filed Vitals:   06/06/13 0935  BP: 120/68  Pulse: 93  Weight: 200 lb 1.9 oz (90.774 kg)  SpO2: 96%    General:  Well appearing. No resp difficulty. Brother present  HEENT: normal Neck: supple. JVP 8-8 Carotids 2+ bilaterally; no bruits. No lymphadenopathy or thryomegaly appreciated. Cor: PMI normal. +RV lift Regular rate & rhythm. No rubs, gallops . 2/6TR Lungs: clear Abdomen: soft, nontender, nondistended. No hepatosplenomegaly. No bruits or masses. Good bowel sounds. Extremities: no cyanosis, clubbing, rash, no edema Neuro: alert & orientedx3, cranial nerves grossly intact. Moves all 4 extremities w/o difficulty. Affect pleasant.   ASSESSMENT 1. Right heart failure due to [place] (cor pulmonale) 2. PAH due to porto-pulmonary HTN  3, Cirrhosis with ongoing ETOH use 4. COPD on home O2  PLAN:  Overall has improved from a functional standpoint. Now NYHA III. Volume status looks good. Will continue current regimen. Reinforced need for home O2 to prevent hypoxia. We discussed get a home pulse oximeter to help him follow more closely. Reinforced need for daily weights and reviewed use of sliding scale diuretics. Will see back in 2 months for repeat 6MW and to consider if we need to add third agent. Call me sooner if not doing well.  Zackeriah Kissler,MD 10:08 AM

## 2013-06-09 ENCOUNTER — Encounter: Payer: Self-pay | Admitting: Gastroenterology

## 2013-06-20 ENCOUNTER — Telehealth (HOSPITAL_COMMUNITY): Payer: Self-pay | Admitting: *Deleted

## 2013-06-20 NOTE — Telephone Encounter (Signed)
Pt called to report he has been feeling bad since Friday, he states he is more SOB and having CP off/on, he states he has been just laying on his couch since Friday, he has not been weighing himself but states he has no LE edema but abd is tight and distended, per Dr Haroldine Laws wear oxygen 2 L and take extra lasix 40 mg now, if not better sch f/u appt, pt is aware and agreeable, he will call me back tomorrow if not feeling better

## 2013-07-04 ENCOUNTER — Other Ambulatory Visit: Payer: Self-pay | Admitting: Family Medicine

## 2013-07-05 ENCOUNTER — Other Ambulatory Visit: Payer: Self-pay | Admitting: Family Medicine

## 2013-07-18 ENCOUNTER — Telehealth: Payer: Self-pay | Admitting: Family Medicine

## 2013-07-18 ENCOUNTER — Encounter: Payer: Self-pay | Admitting: Family Medicine

## 2013-07-18 ENCOUNTER — Ambulatory Visit (INDEPENDENT_AMBULATORY_CARE_PROVIDER_SITE_OTHER): Admitting: Family Medicine

## 2013-07-18 VITALS — BP 130/80 | HR 84 | Temp 98.0°F | Ht 72.0 in | Wt 207.0 lb

## 2013-07-18 DIAGNOSIS — E876 Hypokalemia: Secondary | ICD-10-CM

## 2013-07-18 DIAGNOSIS — M109 Gout, unspecified: Secondary | ICD-10-CM

## 2013-07-18 DIAGNOSIS — IMO0002 Reserved for concepts with insufficient information to code with codable children: Secondary | ICD-10-CM

## 2013-07-18 DIAGNOSIS — Z23 Encounter for immunization: Secondary | ICD-10-CM

## 2013-07-18 DIAGNOSIS — E039 Hypothyroidism, unspecified: Secondary | ICD-10-CM

## 2013-07-18 DIAGNOSIS — I10 Essential (primary) hypertension: Secondary | ICD-10-CM

## 2013-07-18 DIAGNOSIS — N184 Chronic kidney disease, stage 4 (severe): Secondary | ICD-10-CM | POA: Insufficient documentation

## 2013-07-18 DIAGNOSIS — Z0000 Encounter for general adult medical examination without abnormal findings: Secondary | ICD-10-CM

## 2013-07-18 DIAGNOSIS — N289 Disorder of kidney and ureter, unspecified: Secondary | ICD-10-CM

## 2013-07-18 DIAGNOSIS — N183 Chronic kidney disease, stage 3 unspecified: Secondary | ICD-10-CM

## 2013-07-18 DIAGNOSIS — I2789 Other specified pulmonary heart diseases: Secondary | ICD-10-CM

## 2013-07-18 DIAGNOSIS — G4700 Insomnia, unspecified: Secondary | ICD-10-CM

## 2013-07-18 HISTORY — DX: Chronic kidney disease, stage 3 unspecified: N18.30

## 2013-07-18 MED ORDER — ALLOPURINOL 100 MG PO TABS: 100.0000 mg | ORAL_TABLET | Freq: Every day | ORAL | Status: DC

## 2013-07-18 MED ORDER — AMOXICILLIN 500 MG PO CAPS: ORAL_CAPSULE | ORAL | Status: DC

## 2013-07-18 MED ORDER — LOSARTAN POTASSIUM 100 MG PO TABS: 100.0000 mg | ORAL_TABLET | Freq: Every day | ORAL | Status: DC

## 2013-07-18 NOTE — Assessment & Plan Note (Signed)
Creatinine was up some at last visit but patient acknowledges he was dehydrated. Reiterated need for adequate hydration, minimize NSAID use and recheck renal panel today

## 2013-07-18 NOTE — Assessment & Plan Note (Signed)
Stable on current dose of Levothyroxine

## 2013-07-18 NOTE — Assessment & Plan Note (Signed)
Well controlled, no changes today 

## 2013-07-18 NOTE — Telephone Encounter (Signed)
Labs prior to visit, lipid, renal, hepatic, tsh, hgba1c, cbc, uric acid   Patient has appointment on 09/02/13 and will be going to [facility] lab

## 2013-07-18 NOTE — Assessment & Plan Note (Signed)
Good O2 sat today at 96% on RA

## 2013-07-18 NOTE — Patient Instructions (Addendum)
Will consider a trial of Creon, pancreatic enzymes if stool frequency persists and Imodium is not helpful   Chronic Kidney Disease Chronic kidney disease occurs when the kidneys are damaged over a long period. The kidneys are two organs that lie on either side of the spine between the middle of the back and the front of the abdomen. The kidneys:   Remove wastes and extra water from the blood.   Produce important hormones. These help keep bones strong, regulate blood pressure, and help create red blood cells.   Balance the fluids and chemicals in the blood and tissues. A small amount of kidney damage may not cause problems, but a large amount of damage may make it difficult or impossible for the kidneys to work the way they should. If steps are not taken to slow down the kidney damage or stop it from getting worse, the kidneys may stop working permanently. Most of the time, chronic kidney disease does not go away. However, it can often be controlled, and those with the disease can usually live normal lives. CAUSES  The most common causes of chronic kidney disease are diabetes and high blood pressure (hypertension). Chronic kidney disease may also be caused by:   Diseases that cause kidneys' filters to become inflamed.   Diseases that affect the immune system.   Genetic diseases.   Medicines that damage the kidneys, such as anti-inflammatory medicines.  Poisoning or exposure to toxic substances.   A reoccurring kidney or urinary infection.   A problem with urine flow. This may be caused by:   Cancer.   Kidney stones.   An enlarged prostate in males. SYMPTOMS  Because the kidney damage in chronic kidney disease occurs slowly, symptoms develop slowly and may not be obvious until the kidney damage becomes severe. A person may have a kidney disease for years without showing any symptoms. Symptoms can include:   Swelling (edema) of the legs, ankles, or feet.   Tiredness  (lethargy).   Nausea or vomiting.   Confusion.   Problems with urination, such as:   Decreased urine production.   Frequent urination, especially at night.   Frequent accidents in children who are potty trained.   Muscle twitches and cramps.   Shortness of breath.  Weakness.   Persistent itchiness.   Loss of appetite.  Metallic taste in the mouth.  Trouble sleeping.  Slowed development in children.  Short stature in children. DIAGNOSIS  Chronic kidney disease may be detected and diagnosed by tests, including blood, urine, imaging, or kidney biopsy tests.  TREATMENT  Most chronic kidney diseases cannot be cured. Treatment usually involves relieving symptoms and preventing or slowing the progression of the disease. Treatment may include:   A special diet. You may need to avoid alcohol and foods thatare salty and high in potassium.   Medicines. These may:   Lower blood pressure.   Relieve anemia.   Relieve swelling.   Protect the bones. HOME CARE INSTRUCTIONS   Follow your prescribed diet.   Only take over-the-counter or prescription medicines as directed by your caregiver.  Do not take any new medicines (prescription, over-the-counter, or nutritional supplements) unless approved by your caregiver. Many medicines can worsen your kidney damage or need to have the dose adjusted.   Quit smoking if you are a smoker. Talk to your caregiver about a smoking cessation program.   Keep all follow-up appointments as directed by your caregiver. SEEK IMMEDIATE MEDICAL CARE IF:  Your symptoms get worse or  you develop new symptoms.   You develop symptoms of end-stage kidney disease. These include:   Headaches.   Abnormally dark or light skin.   Numbness in the hands or feet.   Easy bruising.   Frequent hiccups.   Menstruation stops.   You have a fever.   You have decreased urine production.   You havepain or bleeding when  urinating. MAKE SURE YOU:  Understand these instructions.  Will watch your condition.  Will get help right away if you are not doing well or get worse. FOR MORE INFORMATION  American Association of Kidney Patients: BombTimer.gl National Kidney Foundation: www.kidney.[place]: https://mathis.com/ [facility]: www.lifeoptions.org and www.kidneyschool.org Document Released: 07/29/2008 Document Revised: 10/06/2012 Document Reviewed: 06/18/2012 [facility] Patient Information 2014 [place], [place].

## 2013-07-18 NOTE — Progress Notes (Signed)
Patient ID: Darren Mueller, male   DOB: 02-Sep-1951, 62 y.o.   MRN: [id number] Darren Mueller [id number] [birth year] 07/18/2013      Progress Note-Follow Up  Subjective  Chief Complaint  Chief Complaint  Patient presents with  . Follow-up    3 month  . Injections    flu    HPI  Patient is a 62 year old African American male who is in today for followup. Overall he feels well. He has not had any medications changed elsewhere. Denies any recent illness. No chest pain, palpitations, shortness of breath, GI or GU concerns at this time. Is taking medications as prescribed. Denies any gouty flares.  Past Medical History  Diagnosis Date  . Hypertension   . Seizures   . Hx of colonic polyps   . GERD (gastroesophageal reflux disease)   . Gout   . Alcoholic cirrhosis of liver with ascites 2014  . Unspecified hypothyroidism 01/25/2013  . Unspecified pleural effusion   . Other chronic pulmonary heart diseases   . Hypopotassemia   . Renal insufficiency 07/18/2013    History reviewed. No pertinent past surgical history.  Family History  Problem Relation Age of Onset  . Heart disease Mother   . Prostate cancer Father   . Colon cancer Neg Hx   . Heart attack Mother   . Hypertension Mother     History   Social History  . Marital Status: Divorced    Spouse Name: N/A    Number of Children: 3  . Years of Education: N/A   Occupational History  . Retired     Social research officer, government   Social History Main Topics  . Smoking status: Former Smoker -- 2.00 packs/day for 5 years    Types: Cigarettes    Quit date: 09/22/1979  . Smokeless tobacco: Never Used  . Alcohol Use: Yes     Comment: 3-4 cans of beers and 4-5 drinks of vodka - has not drunk any alcohol in the last few weeks  . Drug Use: No  . Sexual Activity: Not on file   Other Topics Concern  . Not on file   Social History Narrative   0 caffeine drinks daily     Current Outpatient Prescriptions on File Prior to Visit  Medication Sig  Dispense Refill  . furosemide (LASIX) 40 MG tablet Take 1 tablet (40 mg total) by mouth daily.  30 tablet  6  . Homeopathic Products (SIMILASAN EARACHE RELIEF OT) Place in ear(s) daily.      Marland Kitchen levothyroxine (SYNTHROID, LEVOTHROID) 25 MCG tablet TAKE 1 TABLET (25 MCG TOTAL) BY MOUTH DAILY.  30 tablet  3  . Loratadine (CLARITIN) 10 MG CAPS Take 1 capsule by mouth as needed.       . Macitentan 10 MG TABS Take 10 mg by mouth daily.      . Misc Natural Products (ALLERGY RELEAF SYSTEM PO) Take 1 tablet by mouth daily.      . mometasone-formoterol (DULERA) 200-5 MCG/ACT AERO Inhale 2 puffs into the lungs 2 (two) times daily.  13 g  6  . omeprazole (PRILOSEC) 40 MG capsule Take 1 capsule (40 mg total) by mouth daily.  90 capsule  3  . potassium chloride (K-DUR) 10 MEQ tablet Take 2 tablets (20 mEq total) by mouth 2 (two) times daily.  120 tablet  6  . spironolactone (ALDACTONE) 50 MG tablet Take 1 tablet (50 mg total) by mouth daily.  30 tablet  11  . Tadalafil, PAH, (ADCIRCA) 20  MG TABS Take 1 tablet (20 mg total) by mouth daily.  30 tablet  3  . verapamil (VERELAN) 180 MG 24 hr capsule Take 1 capsule (180 mg total) by mouth 2 (two) times daily.  180 capsule  0  . zolpidem (AMBIEN) 10 MG tablet Take 1 tablet (10 mg total) by mouth at bedtime as needed for sleep.  30 tablet  2   No current facility-administered medications on file prior to visit.    Allergies  Allergen Reactions  . Aspirin Other (See Comments)    hypertension    Review of Systems  Review of Systems  Constitutional: Negative for fever and malaise/fatigue.  HENT: Negative for congestion.   Eyes: Negative for discharge.  Respiratory: Negative for shortness of breath.   Cardiovascular: Negative for chest pain, palpitations and leg swelling.  Gastrointestinal: Negative for nausea, abdominal pain and diarrhea.  Genitourinary: Negative for dysuria.  Musculoskeletal: Negative for falls.  Skin: Negative for rash.  Neurological:  Negative for loss of consciousness and headaches.  Endo/Heme/Allergies: Negative for polydipsia.  Psychiatric/Behavioral: Negative for depression and suicidal ideas. The patient is not nervous/anxious and does not have insomnia.     Objective  BP 130/80  Pulse 84  Temp(Src) 98 F (36.7 C) (Oral)  Ht 6' (1.829 m)  Wt 207 lb (93.895 kg)  BMI 28.07 kg/m2  SpO2 96%  Physical Exam  Physical Exam  Constitutional: He is oriented to person, place, and time and well-developed, well-nourished, and in no distress. No distress.  HENT:  Head: Normocephalic and atraumatic.  Eyes: Conjunctivae are normal.  Neck: Neck supple. No thyromegaly present.  Cardiovascular: Normal rate, regular rhythm and normal heart sounds.   No murmur heard. Pulmonary/Chest: Effort normal and breath sounds normal. No respiratory distress.  Abdominal: He exhibits no distension and no mass. There is no tenderness.  Musculoskeletal: He exhibits no edema.  Neurological: He is alert and oriented to person, place, and time.  Skin: Skin is warm.  Psychiatric: Memory, affect and judgment normal.    Lab Results  Component Value Date   TSH 3.229 04/19/2013   Lab Results  Component Value Date   WBC 3.0* 04/19/2013   HGB 11.7* 04/19/2013   HCT 34.8* 04/19/2013   MCV 103.9* 04/19/2013   PLT 70* 04/19/2013   Lab Results  Component Value Date   CREATININE 1.57* 04/19/2013   BUN 26* 04/19/2013   NA 138 04/19/2013   K 3.9 04/19/2013   CL 109 04/19/2013   CO2 19 04/19/2013   Lab Results  Component Value Date   ALT 19 04/19/2013   AST 38* 04/19/2013   ALKPHOS 142* 04/19/2013   BILITOT 0.6 04/19/2013   Lab Results  Component Value Date   CHOL 174 04/19/2013   Lab Results  Component Value Date   HDL 54 04/19/2013   Lab Results  Component Value Date   LDLCALC 65 04/19/2013   Lab Results  Component Value Date   TRIG 274* 04/19/2013   Lab Results  Component Value Date   CHOLHDL 3.2 04/19/2013     Assessment &  Plan  Hypertension Well controlled, no changes today  Secondary pulmonary hypertension Good O2 sat today at 96% on RA  Renal insufficiency Creatinine was up some at last visit but patient acknowledges he was dehydrated. Reiterated need for adequate hydration, minimize NSAID use and recheck renal panel today  Unspecified hypothyroidism Stable on current dose of Levothyroxine

## 2013-07-19 LAB — RENAL FUNCTION PANEL
Albumin: 4 g/dL (ref 3.5–5.2)
Chloride: 104 mEq/L (ref 96–112)
Creat: 1.67 mg/dL — ABNORMAL HIGH (ref 0.50–1.35)
Phosphorus: 3.2 mg/dL (ref 2.3–4.6)
Sodium: 136 mEq/L (ref 135–145)

## 2013-07-19 NOTE — Progress Notes (Signed)
Quick Note:  Patient Informed and voiced understanding ______ 

## 2013-07-19 NOTE — Addendum Note (Signed)
Addended by: Varney Daily on: 07/19/2013 11:50 AM   Modules accepted: Orders

## 2013-08-29 ENCOUNTER — Ambulatory Visit (HOSPITAL_COMMUNITY)
Admission: RE | Admit: 2013-08-29 | Discharge: 2013-08-29 | Disposition: A | Discharge status: Home / Self Care | Source: Ambulatory Visit | Attending: Internal Medicine | Admitting: Internal Medicine

## 2013-08-29 ENCOUNTER — Encounter (HOSPITAL_COMMUNITY): Payer: Self-pay

## 2013-08-29 VITALS — BP 108/66 | HR 98 | Ht 72.0 in | Wt 209.4 lb

## 2013-08-29 DIAGNOSIS — Z79899 Other long term (current) drug therapy: Secondary | ICD-10-CM | POA: Insufficient documentation

## 2013-08-29 DIAGNOSIS — K7030 Alcoholic cirrhosis of liver without ascites: Secondary | ICD-10-CM | POA: Insufficient documentation

## 2013-08-29 DIAGNOSIS — E039 Hypothyroidism, unspecified: Secondary | ICD-10-CM | POA: Insufficient documentation

## 2013-08-29 DIAGNOSIS — K219 Gastro-esophageal reflux disease without esophagitis: Secondary | ICD-10-CM | POA: Insufficient documentation

## 2013-08-29 DIAGNOSIS — I2721 Secondary pulmonary arterial hypertension: Secondary | ICD-10-CM

## 2013-08-29 DIAGNOSIS — I50810 Right heart failure, unspecified: Secondary | ICD-10-CM

## 2013-08-29 DIAGNOSIS — I2789 Other specified pulmonary heart diseases: Secondary | ICD-10-CM | POA: Insufficient documentation

## 2013-08-29 DIAGNOSIS — I2729 Other secondary pulmonary hypertension: Secondary | ICD-10-CM

## 2013-08-29 DIAGNOSIS — J449 Chronic obstructive pulmonary disease, unspecified: Secondary | ICD-10-CM

## 2013-08-29 DIAGNOSIS — I509 Heart failure, unspecified: Secondary | ICD-10-CM | POA: Insufficient documentation

## 2013-08-29 DIAGNOSIS — F1020 Alcohol dependence, uncomplicated: Secondary | ICD-10-CM | POA: Insufficient documentation

## 2013-08-29 DIAGNOSIS — K7460 Unspecified cirrhosis of liver: Secondary | ICD-10-CM

## 2013-08-29 DIAGNOSIS — I10 Essential (primary) hypertension: Secondary | ICD-10-CM | POA: Insufficient documentation

## 2013-08-29 LAB — BASIC METABOLIC PANEL
Calcium: 9.3 mg/dL (ref 8.4–10.5)
GFR calc non Af Amer: 45 mL/min — ABNORMAL LOW (ref >90)
Potassium: 3.1 mEq/L — ABNORMAL LOW (ref 3.5–5.1)
Sodium: 135 mEq/L (ref 135–145)

## 2013-08-29 LAB — PRO B NATRIURETIC PEPTIDE: Pro B Natriuretic peptide (BNP): 1262 pg/mL — ABNORMAL HIGH (ref 0–125)

## 2013-08-29 MED ORDER — TADALAFIL (PAH) 20 MG PO TABS: 40.0000 mg | ORAL_TABLET | Freq: Every day | ORAL | Status: DC

## 2013-08-29 NOTE — Progress Notes (Signed)
Patient ID: Darren Mueller, male   DOB: 09-30-1951, 62 y.o.   MRN: [id number]  Weight Range   Baseline proBNP   PCP: Dr. Charlett Blake  HPI: Mr. Cashman is a 62 year-old male with a history of COPD, cirrhosis felt secondary to alcohol (Noted to have varices in 2010 on routine EGD) and portopulmonary HTN.   He developed dyspnea on exertion in early 2014.  He was noted to have a right effusion and had thoracentesis which was transudative.  Echocardiogram in February 2014 showed an ejection fraction of 55-60%, moderate left ventricular hypertrophy, grade 1 diastolic dysfunction. The right atrium and right ventricle were severely enlarged and right ventricular function was severely reduced. There was mild aortic insufficiency, moderate tricuspid regurgitation and severe pulmonary hypertension. Abdominal ultrasound in January 2014 showed features consistent with cirrhosis and ascites. Chest CT in January of 2014 showed a right pleural effusion, probable cirrhosis and an 62m pulmonary nodule. There is atherosclerosis noted in the coronary arteries.   Underwent RHC 12/31/12. It confirmed the diagnosis of PAH. He was felt to have porto-pulmonary HTN and started on macitentan 01/18/2013.  RA = 13  RV = 63/5/14  PA = 65/24 (41)  PCW = 6  Hepatic wedge = 21  Fick cardiac output/index = 3.8/1.8  PVR = 9.1  FA sat = 94%  PA sat = 58%, 56%  SVC = 54%  6 min walk 01/10/13: 1290 feet, his O2 sat ranged from 90-98% on RA 6 MW 03/24/13: 1330 feet. O2 > 90% on ear probe no CP  6 MW 10/14: 1420 feet (433 m), minimum O2 sat 90%  Echo 5/14 with EF 55% with severe RV dilation/dysfunction. Mild TR PAP 65-70 TAPSE 1.1   Labs 04/19/13 K 3.9 Creatinine 1.57, LDL 65, HDL 54 Labs 07/18/13 K 3.6 Creatinine 1.67   Stable.  Feels like breathing is better on PAH meds.  He takes macitentan and tadalafil.  Can walk around [employer] problems if he takes his time. No presyncope/syncope. No chest pain or edema.  He was short  of breath on the 6 minute walk today.  He still uses ETOH occasionally but nowhere near his past use.   SH: Prior tobacco, prior heavy ETOH but still occasional ETOH.  [place]   ROS: All systems negative except as listed in HPI, PMH and Problem List.  Past Medical History  Diagnosis Date  . Hypertension   . Seizures   . Hx of colonic polyps   . GERD (gastroesophageal reflux disease)   . Gout   . Alcoholic cirrhosis of liver with ascites 2014  . Unspecified hypothyroidism 01/25/2013  . Unspecified pleural effusion   . Other chronic pulmonary heart diseases   . Hypopotassemia   . Renal insufficiency 07/18/2013    Current Outpatient Prescriptions  Medication Sig Dispense Refill  . allopurinol (ZYLOPRIM) 100 MG tablet Take 1 tablet (100 mg total) by mouth daily.  90 tablet  1  . amoxicillin (AMOXIL) 500 MG capsule 2 caps now then 1 cap po q 6 hours prn dental work  40 capsule  0  . Doxepin HCl (SILENOR) 6 MG TABS Take by mouth.      . furosemide (LASIX) 40 MG tablet Take 1 tablet (40 mg total) by mouth daily.  30 tablet  6  . levothyroxine (SYNTHROID, LEVOTHROID) 25 MCG tablet TAKE 1 TABLET (25 MCG TOTAL) BY MOUTH DAILY.  30 tablet  3  . losartan (COZAAR) 100 MG  tablet Take 1 tablet (100 mg total) by mouth daily.  90 tablet  1  . Macitentan (OPSUMIT) 10 MG TABS Take by mouth.      . mometasone-formoterol (DULERA) 200-5 MCG/ACT AERO Inhale 2 puffs into the lungs 2 (two) times daily.  13 g  6  . omeprazole (PRILOSEC) 40 MG capsule Take 1 capsule (40 mg total) by mouth daily.  90 capsule  3  . potassium chloride (K-DUR) 10 MEQ tablet Take 2 tablets (20 mEq total) by mouth 2 (two) times daily.  120 tablet  6  . spironolactone (ALDACTONE) 50 MG tablet Take 1 tablet (50 mg total) by mouth daily.  30 tablet  11  . Tadalafil, PAH, (ADCIRCA) 20 MG TABS Take 2 tablets (40 mg total) by mouth daily.  180 tablet  3  . verapamil (VERELAN) 180 MG 24 hr capsule Take 1  capsule (180 mg total) by mouth 2 (two) times daily.  180 capsule  0  . zolpidem (AMBIEN) 10 MG tablet Take 1 tablet (10 mg total) by mouth at bedtime as needed for sleep.  30 tablet  2   No current facility-administered medications for this encounter.     PHYSICAL EXAM: Filed Vitals:   08/29/13 0903  BP: 108/66  Pulse: 98  Height: 6' (1.829 m)  Weight: 209 lb 6.4 oz (94.983 kg)  SpO2: 94%    General:  Well appearing. No resp difficulty. Brother present  HEENT: normal Neck: supple. JVP 7 Carotids 2+ bilaterally; no bruits. No lymphadenopathy or thryomegaly appreciated. Cor: PMI normal. Widely split S2, +RV lift, Regular rate & rhythm. No rubs, gallops . 2/6 TR Lungs: clear Abdomen: soft, nontender, nondistended. No hepatosplenomegaly. No bruits or masses. Good bowel sounds. Extremities: no cyanosis, clubbing, rash, no edema Neuro: alert & orientedx3, cranial nerves grossly intact. Moves all 4 extremities w/o difficulty. Affect pleasant.   ASSESSMENT/PLAN 1. RV failure in setting of PAH: Volume stable today.  NYHA class III symptoms.  Continue lasix 40 mg daily and spironolactone 50 mg daily.  Will check BMET/BNP today.  2. PAH due to porto-pulmonary HTN: Diagnosis confirmed by [place] 12/2012.  Currently on macitentan and tadalafil. Echo 5/14 with EF 55% with severe RV dilation/dysfunction, mild TR, PA systolic pressure 37-30.  NYHA class III symptoms but improved 6 minute walk today.  Overall feeling better on medications.  - Can increase tadalafil to 40 mg daily today.  - Continue current Opsumit.  - Repeat echo in 5/15 - Followup in 3 months.  3. Cirrhosis with ongoing ETOH use: He has cut back but still drinking some.  I asked him to stop altogether.  He follows with Dr. Ardis Hughs in GI.  No evidence for ascites on exam.  4. COPD: Using home oxygen at night.  Oxygen saturation remained 90% or above with ambulation today.   Loralie Champagne  08/29/2013

## 2013-08-29 NOTE — Patient Instructions (Signed)
Increase Adcirca to 40 mg (2 tabs) daily  Labs today  We will contact you in 3 months to schedule your next appointment.

## 2013-08-29 NOTE — Progress Notes (Signed)
6 min walk test preformed, pt ambulated 1420 ft (432 m), pt tolerated well O2 sat ranged from 89-91%, HR ranged from 94-123

## 2013-08-30 ENCOUNTER — Ambulatory Visit (INDEPENDENT_AMBULATORY_CARE_PROVIDER_SITE_OTHER): Admitting: Critical Care Medicine

## 2013-08-30 ENCOUNTER — Encounter: Payer: Self-pay | Admitting: Critical Care Medicine

## 2013-08-30 VITALS — BP 138/80 | HR 84 | Temp 97.6°F | Ht 72.0 in | Wt 213.8 lb

## 2013-08-30 DIAGNOSIS — I2789 Other specified pulmonary heart diseases: Secondary | ICD-10-CM

## 2013-08-30 DIAGNOSIS — Z23 Encounter for immunization: Secondary | ICD-10-CM

## 2013-08-30 DIAGNOSIS — E876 Hypokalemia: Secondary | ICD-10-CM

## 2013-08-30 DIAGNOSIS — J449 Chronic obstructive pulmonary disease, unspecified: Secondary | ICD-10-CM

## 2013-08-30 DIAGNOSIS — IMO0002 Reserved for concepts with insufficient information to code with codable children: Secondary | ICD-10-CM

## 2013-08-30 DIAGNOSIS — J4489 Other specified chronic obstructive pulmonary disease: Secondary | ICD-10-CM

## 2013-08-30 MED ORDER — POTASSIUM CHLORIDE ER 10 MEQ PO TBCR: EXTENDED_RELEASE_TABLET | ORAL | Status: DC

## 2013-08-30 NOTE — Progress Notes (Signed)
Subjective:    Patient ID: Darren Mueller, male    DOB: 1951-08-20, 62 y.o.   MRN: [id number]  HPI  Darren Mueller is a 45 former smoker year-old male with a history of cirrhosis felt secondary to alcohol (Noted to have varices in 2010 on routine EGD). Seen for initial pulmonary consult 12/09/12 d/t mod right pleural effusion -Transudative -s/p thoracentesis  W/ 1.5 L removed.   02/17/2013 Rov for PUlm HTN d/t liver dz, copd, hypoxemia, chr resp failure, recurrent pleural effusions d/t liver dz. At 3/11 OV with NP changed Qvar to [facility] for BD properties.  Followed in CHF clinic and started on macitantan See below: Underwent RHC 12/31/12. It confirmed the diagnosis of PAH. He was felt to have porto-pulmonary HTN and started on macitentan 01/18/2013.  RA = 13  RV = 63/5/14  PA = 65/24 (41)  PCW = 6  Hepatic wedge = 21  Fick cardiac output/index = 3.8/1.8  PVR = 9.1  FA sat = 94%  PA sat = 58%, 56%  SVC = 54%  6 min walk 01/10/13, pt ambulated 1290 feet, his O2 sat ranged from 90-98% on RA   Pt back on lasix and aldactone. (had been taken off lasix and left on aldactone per GI)  ? Of revatio? But only on macitantan for 3 weeks as of yet.   Pt still is dyspneic with exertion. Has to pace self. Has to stop with walking.  No real cough.  No wheeze.  No chest pain .  Weight is up again #203 from 180#  08/30/2013 Chief Complaint  Patient presents with  . Follow-up    Breathing is unchanged since last OV. No concerns today  Dyspnea is the same. Saw [place] plan to increase to 36m qd Adcirca.  Notes min edema in the feet. Abd swelling is sl more.    Review of Systems  Constitutional:   No  weight loss, night sweats,  Fevers, chills, fatigue, or  lassitude.  HEENT:   No headaches,  Difficulty swallowing,  Tooth/dental problems, or  Sore throat,                No sneezing, itching, ear ache, +nasal congestion, post nasal drip,   CV:  No chest pain,  Orthopnea, PND,  anasarca,  dizziness, palpitations, syncope.  +periph edema  GI  No heartburn, indigestion, abdominal pain, nausea, vomiting, diarrhea, change in bowel habits, loss of appetite, bloody stools.   Resp:   No excess mucus, no productive cough,  No non-productive cough,  No coughing up of blood.  No change in color of mucus.  No wheezing.  No chest wall deformity  Skin: no rash or lesions.  GU: no dysuria, change in color of urine, no urgency or frequency.  No flank pain, no hematuria   MS:  No joint pain or swelling.  No decreased range of motion.  No back pain.  Psych:  No change in mood or affect. No depression or anxiety.  No memory loss.     Objective:   Physical Exam BP 138/80  Pulse 84  Temp(Src) 97.6 F (36.4 C) (Oral)  Ht 6' (1.829 m)  Wt 213 lb 12.8 oz (96.979 kg)  BMI 28.99 kg/m2  SpO2 98%  GEN: A/Ox3; pleasant , NAD, well nourished   HEENT:  [place]/AT,  EACs-clear, TMs-wnl, NOSE-clear drainage  THROAT-clear, no lesions, no postnasal drip or exudate noted.   NECK:  Supple w/ fair ROM; no JVD; normal carotid impulses w/o  bruits; no thyromegaly or nodules palpated; no lymphadenopathy.  RESP  Clear  P & A; w/o, wheezes/ rales/ or rhonchi.no accessory muscle use, no dullness to percussion  CARD:  RRR, no m/r/g  , 1+peripheral edema, pulses intact, no cyanosis or clubbing.  GI:   Soft & nt; nml bowel sounds; no organomegaly or masses detected.  Musco: Warm bil, no deformities or joint swelling noted.   Neuro: alert, no focal deficits noted.    Skin: Warm, no lesions or rashes     Assessment & Plan:   Hypokalemia Hypokalemia d/t diuretic use Plan Increase potassium to 3 twice daily for 4 days then reduce to 2 twice daily   Obstructive chronic bronchitis without exacerbation Stable copd stage C Plan Cont dulera  No other changes Pneumovax given  Secondary pulmonary hypertension Secondary PAH d/t liver dz adcirca increased per cardiology    Updated Medication  List Outpatient Encounter Prescriptions as of 08/30/2013  Medication Sig Dispense Refill  . allopurinol (ZYLOPRIM) 100 MG tablet Take 1 tablet (100 mg total) by mouth daily.  90 tablet  1  . amoxicillin (AMOXIL) 500 MG capsule 2 caps now then 1 cap po q 6 hours prn dental work  40 capsule  0  . Doxepin HCl (SILENOR) 6 MG TABS Take by mouth.      . furosemide (LASIX) 40 MG tablet Take 1 tablet (40 mg total) by mouth daily.  30 tablet  6  . levothyroxine (SYNTHROID, LEVOTHROID) 25 MCG tablet TAKE 1 TABLET (25 MCG TOTAL) BY MOUTH DAILY.  30 tablet  3  . losartan (COZAAR) 100 MG tablet Take 1 tablet (100 mg total) by mouth daily.  90 tablet  1  . Macitentan (OPSUMIT) 10 MG TABS Take by mouth.      . mometasone-formoterol (DULERA) 200-5 MCG/ACT AERO Inhale 2 puffs into the lungs 2 (two) times daily.  13 g  6  . omeprazole (PRILOSEC) 40 MG capsule Take 1 capsule (40 mg total) by mouth daily.  90 capsule  3  . spironolactone (ALDACTONE) 50 MG tablet Take 1 tablet (50 mg total) by mouth daily.  30 tablet  11  . Tadalafil, PAH, (ADCIRCA) 20 MG TABS Take 2 tablets (40 mg total) by mouth daily.  180 tablet  3  . verapamil (VERELAN) 180 MG 24 hr capsule Take 1 capsule (180 mg total) by mouth 2 (two) times daily.  180 capsule  0  . zolpidem (AMBIEN) 10 MG tablet Take 1 tablet (10 mg total) by mouth at bedtime as needed for sleep.  30 tablet  2  . [DISCONTINUED] potassium chloride (K-DUR) 10 MEQ tablet Take 2 tablets (20 mEq total) by mouth 2 (two) times daily.  120 tablet  6  . [DISCONTINUED] potassium chloride (K-DUR) 10 MEQ tablet Take 3 twice a day for 4 days then reduce to two twice daily  120 tablet  6   No facility-administered encounter medications on file as of 08/30/2013.

## 2013-08-30 NOTE — Patient Instructions (Addendum)
Increase potassium to 3 twice daily for 4 days then reduce to 2 twice daily Pneumonia shot given today No other medication changes Return 4 months [place]

## 2013-08-31 ENCOUNTER — Other Ambulatory Visit (HOSPITAL_COMMUNITY): Payer: Self-pay | Admitting: Cardiology

## 2013-08-31 MED ORDER — POTASSIUM CHLORIDE ER 10 MEQ PO TBCR: EXTENDED_RELEASE_TABLET | ORAL | Status: DC

## 2013-08-31 NOTE — Telephone Encounter (Signed)
rx increase sent into pharm,

## 2013-09-01 NOTE — Assessment & Plan Note (Signed)
Secondary PAH d/t liver dz adcirca increased per cardiology

## 2013-09-01 NOTE — Assessment & Plan Note (Signed)
Hypokalemia d/t diuretic use Plan Increase potassium to 3 twice daily for 4 days then reduce to 2 twice daily

## 2013-09-01 NOTE — Assessment & Plan Note (Signed)
Stable copd stage C Plan Cont dulera  No other changes Pneumovax given

## 2013-09-02 ENCOUNTER — Other Ambulatory Visit: Payer: Self-pay | Admitting: Family Medicine

## 2013-09-02 ENCOUNTER — Ambulatory Visit (INDEPENDENT_AMBULATORY_CARE_PROVIDER_SITE_OTHER): Admitting: Family Medicine

## 2013-09-02 ENCOUNTER — Encounter: Payer: Self-pay | Admitting: Family Medicine

## 2013-09-02 VITALS — BP 108/70 | HR 90 | Temp 97.9°F | Ht 72.0 in | Wt 213.0 lb

## 2013-09-02 DIAGNOSIS — I10 Essential (primary) hypertension: Secondary | ICD-10-CM

## 2013-09-02 DIAGNOSIS — R197 Diarrhea, unspecified: Secondary | ICD-10-CM

## 2013-09-02 DIAGNOSIS — E039 Hypothyroidism, unspecified: Secondary | ICD-10-CM

## 2013-09-02 DIAGNOSIS — E876 Hypokalemia: Secondary | ICD-10-CM

## 2013-09-02 DIAGNOSIS — Z23 Encounter for immunization: Secondary | ICD-10-CM

## 2013-09-02 LAB — CBC
HCT: 36.4 % — ABNORMAL LOW (ref 39.0–52.0)
Hemoglobin: 12.4 g/dL — ABNORMAL LOW (ref 13.0–17.0)
MCH: 35.5 pg — ABNORMAL HIGH (ref 26.0–34.0)
MCV: 104.3 fL — ABNORMAL HIGH (ref 78.0–100.0)
RBC: 3.49 MIL/uL — ABNORMAL LOW (ref 4.22–5.81)
RDW: 13.2 % (ref 11.5–15.5)
WBC: 3.1 10*3/uL — ABNORMAL LOW (ref 4.0–10.5)

## 2013-09-02 LAB — RENAL FUNCTION PANEL
Albumin: 4.1 g/dL (ref 3.5–5.2)
BUN: 31 mg/dL — ABNORMAL HIGH (ref 6–23)
CO2: 19 mEq/L (ref 19–32)
Chloride: 106 mEq/L (ref 96–112)
Creat: 1.6 mg/dL — ABNORMAL HIGH (ref 0.50–1.35)
Glucose, Bld: 101 mg/dL — ABNORMAL HIGH (ref 70–99)
Phosphorus: 3.1 mg/dL (ref 2.3–4.6)

## 2013-09-02 LAB — HEPATIC FUNCTION PANEL
Bilirubin, Direct: 0.2 mg/dL (ref 0.0–0.3)
Indirect Bilirubin: 0.5 mg/dL (ref 0.0–0.9)
Total Bilirubin: 0.7 mg/dL (ref 0.3–1.2)

## 2013-09-02 NOTE — Patient Instructions (Addendum)
Proceed with Potassium 2 tabs three times a day for now.   Diarrhea Diarrhea is frequent loose and watery bowel movements. It can cause you to feel weak and dehydrated. Dehydration can cause you to become tired and thirsty, have a dry mouth, and have decreased urination that often is dark yellow. Diarrhea is a sign of another problem, most often an infection that will not last long. In most cases, diarrhea typically lasts 2 3 days. However, it can last longer if it is a sign of something more serious. It is important to treat your diarrhea as directed by your caregive to lessen or prevent future episodes of diarrhea. CAUSES  Some common causes include:  Gastrointestinal infections caused by viruses, bacteria, or parasites.  Food poisoning or food allergies.  Certain medicines, such as antibiotics, chemotherapy, and laxatives.  Artificial sweeteners and fructose.  Digestive disorders. HOME CARE INSTRUCTIONS  Ensure adequate fluid intake (hydration): have 1 cup (8 oz) of fluid for each diarrhea episode. Avoid fluids that contain simple sugars or sports drinks, fruit juices, whole milk products, and sodas. Your urine should be clear or pale yellow if you are drinking enough fluids. Hydrate with an oral rehydration solution that you can purchase at pharmacies, retail stores, and online. You can prepare an oral rehydration solution at home by mixing the following ingredients together:    tsp table salt.   tsp baking soda.   tsp salt substitute containing potassium chloride.  1  tablespoons sugar.  1 L (34 oz) of water.  Certain foods and beverages may increase the speed at which food moves through the gastrointestinal (GI) tract. These foods and beverages should be avoided and include:  Caffeinated and alcoholic beverages.  High-fiber foods, such as raw fruits and vegetables, nuts, seeds, and whole grain breads and cereals.  Foods and beverages sweetened with sugar alcohols, such as  xylitol, sorbitol, and mannitol.  Some foods may be well tolerated and may help thicken stool including:  Starchy foods, such as rice, toast, pasta, low-sugar cereal, oatmeal, grits, baked potatoes, crackers, and bagels.  Bananas.  Applesauce.  Add probiotic-rich foods to help increase healthy bacteria in the GI tract, such as yogurt and fermented milk products.  Wash your hands well after each diarrhea episode.  Only take over-the-counter or prescription medicines as directed by your caregiver.  Take a warm bath to relieve any burning or pain from frequent diarrhea episodes. SEEK IMMEDIATE MEDICAL CARE IF:   You are unable to keep fluids down.  You have persistent vomiting.  You have blood in your stool, or your stools are black and tarry.  You do not urinate in 6 8 hours, or there is only a small amount of very dark urine.  You have abdominal pain that increases or localizes.  You have weakness, dizziness, confusion, or lightheadedness.  You have a severe headache.  Your diarrhea gets worse or does not get better.  You have a fever or persistent symptoms for more than 2 3 days.  You have a fever and your symptoms suddenly get worse. MAKE SURE YOU:   Understand these instructions.  Will watch your condition.  Will get help right away if you are not doing well or get worse. Document Released: 10/10/2002 Document Revised: 10/06/2012 Document Reviewed: 06/27/2012 [facility] Patient Information 2014 [place], [place].

## 2013-09-04 ENCOUNTER — Encounter: Payer: Self-pay | Admitting: Family Medicine

## 2013-09-04 DIAGNOSIS — R197 Diarrhea, unspecified: Secondary | ICD-10-CM | POA: Insufficient documentation

## 2013-09-04 NOTE — Assessment & Plan Note (Signed)
Well controlled, no changes 

## 2013-09-04 NOTE — Assessment & Plan Note (Signed)
Check stool cultures, add probiotics and fiber supplements

## 2013-09-04 NOTE — Assessment & Plan Note (Signed)
Will continue KCL  2 Tabs tid and continue to monitor

## 2013-09-04 NOTE — Progress Notes (Signed)
Patient ID: Darren Mueller, male   DOB: 22-Apr-1951, 62 y.o.   MRN: [id number] Krystopher Kuenzel [id number] Apr 06, 1951 09/04/2013      Progress Note-Follow Up  Subjective  Chief Complaint  Chief Complaint  Patient presents with  . Follow-up    6 week  . Injections    tdap    HPI  Patient is a 62 year old male who is in today for followup. His major complaint is of frequent loose stool. He says it's been going on for a while over a month. No bloody or tarry stool. Can happen twice a day or more. Denies muscle cramps. Denies abdominal pain, fevers or chills. No nausea vomiting or anorexia. Denies chest pain, palpitations or shortness of breath. Taking medications as prescribed  Past Medical History  Diagnosis Date  . Hypertension   . Seizures   . Hx of colonic polyps   . GERD (gastroesophageal reflux disease)   . Gout   . Alcoholic cirrhosis of liver with ascites 2014  . Unspecified hypothyroidism 01/25/2013  . Unspecified pleural effusion   . Other chronic pulmonary heart diseases   . Hypopotassemia   . Renal insufficiency 07/18/2013  . Diarrhea 09/04/2013    No past surgical history on file.  Family History  Problem Relation Age of Onset  . Heart disease Mother   . Prostate cancer Father   . Colon cancer Neg Hx   . Heart attack Mother   . Hypertension Mother     History   Social History  . Marital Status: Divorced    Spouse Name: N/A    Number of Children: 3  . Years of Education: N/A   Occupational History  . Retired     Social research officer, government   Social History Main Topics  . Smoking status: Former Smoker -- 2.00 packs/day for 5 years    Types: Cigarettes    Quit date: 09/22/1979  . Smokeless tobacco: Never Used  . Alcohol Use: Yes     Comment: 3-4 cans of beers and 4-5 drinks of vodka - has not drunk any alcohol in the last few weeks  . Drug Use: No  . Sexual Activity: Not on file   Other Topics Concern  . Not on file   Social History Narrative   0 caffeine drinks  daily     Current Outpatient Prescriptions on File Prior to Visit  Medication Sig Dispense Refill  . allopurinol (ZYLOPRIM) 100 MG tablet Take 1 tablet (100 mg total) by mouth daily.  90 tablet  1  . Doxepin HCl (SILENOR) 6 MG TABS Take by mouth.      . furosemide (LASIX) 40 MG tablet Take 1 tablet (40 mg total) by mouth daily.  30 tablet  6  . levothyroxine (SYNTHROID, LEVOTHROID) 25 MCG tablet TAKE 1 TABLET (25 MCG TOTAL) BY MOUTH DAILY.  30 tablet  3  . losartan (COZAAR) 100 MG tablet Take 1 tablet (100 mg total) by mouth daily.  90 tablet  1  . Macitentan (OPSUMIT) 10 MG TABS Take by mouth.      . mometasone-formoterol (DULERA) 200-5 MCG/ACT AERO Inhale 2 puffs into the lungs 2 (two) times daily.  13 g  6  . omeprazole (PRILOSEC) 40 MG capsule Take 1 capsule (40 mg total) by mouth daily.  90 capsule  3  . potassium chloride (K-DUR) 10 MEQ tablet take4 tabs po (40 MeQ) BID  120 tablet  6  . spironolactone (ALDACTONE) 50 MG tablet Take 1 tablet (50 mg  total) by mouth daily.  30 tablet  11  . Tadalafil, PAH, (ADCIRCA) 20 MG TABS Take 2 tablets (40 mg total) by mouth daily.  180 tablet  3  . verapamil (VERELAN) 180 MG 24 hr capsule Take 1 capsule (180 mg total) by mouth 2 (two) times daily.  180 capsule  0  . zolpidem (AMBIEN) 10 MG tablet Take 1 tablet (10 mg total) by mouth at bedtime as needed for sleep.  30 tablet  2  . amoxicillin (AMOXIL) 500 MG capsule 2 caps now then 1 cap po q 6 hours prn dental work  40 capsule  0   No current facility-administered medications on file prior to visit.    Allergies  Allergen Reactions  . Aspirin Other (See Comments)    hypertension    Review of Systems  Review of Systems  Constitutional: Negative for fever and malaise/fatigue.  HENT: Negative for congestion.   Eyes: Negative for discharge.  Respiratory: Negative for shortness of breath.   Cardiovascular: Negative for chest pain, palpitations and leg swelling.  Gastrointestinal: Positive  for diarrhea. Negative for nausea and abdominal pain.  Genitourinary: Negative for dysuria.  Musculoskeletal: Negative for falls.  Skin: Negative for rash.  Neurological: Negative for loss of consciousness and headaches.  Endo/Heme/Allergies: Negative for polydipsia.  Psychiatric/Behavioral: Negative for depression and suicidal ideas. The patient is not nervous/anxious and does not have insomnia.     Objective  BP 108/70  Pulse 90  Temp(Src) 97.9 F (36.6 C) (Oral)  Ht 6' (1.829 m)  Wt 213 lb (96.616 kg)  BMI 28.88 kg/m2  SpO2 95%  Physical Exam  Physical Exam  Constitutional: He is oriented to person, place, and time and well-developed, well-nourished, and in no distress. No distress.  HENT:  Head: Normocephalic and atraumatic.  Eyes: Conjunctivae are normal.  Neck: Neck supple. No thyromegaly present.  Cardiovascular: Normal rate, regular rhythm and normal heart sounds.   No murmur heard. Pulmonary/Chest: Effort normal and breath sounds normal. No respiratory distress.  Abdominal: He exhibits no distension and no mass. There is no tenderness.  Musculoskeletal: He exhibits no edema.  Neurological: He is alert and oriented to person, place, and time.  Skin: Skin is warm.  Psychiatric: Memory, affect and judgment normal.    Lab Results  Component Value Date   TSH 3.229 04/19/2013   Lab Results  Component Value Date   WBC 3.1* 09/02/2013   HGB 12.4* 09/02/2013   HCT 36.4* 09/02/2013   MCV 104.3* 09/02/2013   PLT 63* 09/02/2013   Lab Results  Component Value Date   CREATININE 1.60* 09/02/2013   BUN 31* 09/02/2013   NA 138 09/02/2013   K 4.1 09/02/2013   CL 106 09/02/2013   CO2 19 09/02/2013   Lab Results  Component Value Date   ALT 24 09/02/2013   AST 55* 09/02/2013   ALKPHOS 195* 09/02/2013   BILITOT 0.7 09/02/2013   Lab Results  Component Value Date   CHOL 174 04/19/2013   Lab Results  Component Value Date   HDL 54 04/19/2013   Lab Results   Component Value Date   LDLCALC 65 04/19/2013   Lab Results  Component Value Date   TRIG 274* 04/19/2013   Lab Results  Component Value Date   CHOLHDL 3.2 04/19/2013     Assessment & Plan  Hypertension Well controlled, no changes.   Unspecified hypothyroidism Well treated, TSH now normal  Diarrhea Check stool cultures, add probiotics and fiber supplements  Hypokalemia Will continue KCL  2 Tabs tid and continue to monitor

## 2013-09-04 NOTE — Assessment & Plan Note (Signed)
Well treated, TSH now normal

## 2013-09-05 ENCOUNTER — Other Ambulatory Visit: Payer: Self-pay | Admitting: Family Medicine

## 2013-09-06 LAB — CLOSTRIDIUM DIFFICILE EIA: CDIFTX: NEGATIVE

## 2013-09-06 LAB — OVA AND PARASITE SCREEN: OP: NONE SEEN

## 2013-09-23 ENCOUNTER — Telehealth: Payer: Self-pay | Admitting: Family Medicine

## 2013-09-23 NOTE — Telephone Encounter (Signed)
Patient left message on nurse voicemail stating that he has questions regarding his last lab results.

## 2013-09-26 ENCOUNTER — Encounter: Payer: Self-pay | Admitting: Family Medicine

## 2013-09-26 ENCOUNTER — Ambulatory Visit (INDEPENDENT_AMBULATORY_CARE_PROVIDER_SITE_OTHER): Admitting: Family Medicine

## 2013-09-26 VITALS — BP 104/68 | HR 88 | Temp 97.5°F | Ht 72.0 in | Wt 210.0 lb

## 2013-09-26 DIAGNOSIS — T7840XD Allergy, unspecified, subsequent encounter: Secondary | ICD-10-CM

## 2013-09-26 DIAGNOSIS — I10 Essential (primary) hypertension: Secondary | ICD-10-CM

## 2013-09-26 DIAGNOSIS — R197 Diarrhea, unspecified: Secondary | ICD-10-CM

## 2013-09-26 DIAGNOSIS — E039 Hypothyroidism, unspecified: Secondary | ICD-10-CM

## 2013-09-26 DIAGNOSIS — Z5189 Encounter for other specified aftercare: Secondary | ICD-10-CM

## 2013-09-26 DIAGNOSIS — F1020 Alcohol dependence, uncomplicated: Secondary | ICD-10-CM

## 2013-09-26 DIAGNOSIS — J309 Allergic rhinitis, unspecified: Secondary | ICD-10-CM

## 2013-09-26 LAB — HEPATIC FUNCTION PANEL
ALT: 31 U/L (ref 0–53)
Albumin: 4.3 g/dL (ref 3.5–5.2)
Alkaline Phosphatase: 150 U/L — ABNORMAL HIGH (ref 39–117)
Indirect Bilirubin: 0.3 mg/dL (ref 0.0–0.9)
Total Protein: 7.6 g/dL (ref 6.0–8.3)

## 2013-09-26 LAB — CBC
HCT: 35.1 % — ABNORMAL LOW (ref 39.0–52.0)
MCH: 34.9 pg — ABNORMAL HIGH (ref 26.0–34.0)
MCHC: 34.8 g/dL (ref 30.0–36.0)
MCV: 100.3 fL — ABNORMAL HIGH (ref 78.0–100.0)
Platelets: 73 10*3/uL — ABNORMAL LOW (ref 150–400)
RDW: 13.1 % (ref 11.5–15.5)
WBC: 3.6 10*3/uL — ABNORMAL LOW (ref 4.0–10.5)

## 2013-09-26 LAB — RENAL FUNCTION PANEL
Albumin: 4.3 g/dL (ref 3.5–5.2)
BUN: 37 mg/dL — ABNORMAL HIGH (ref 6–23)
CO2: 20 mEq/L (ref 19–32)
Chloride: 107 mEq/L (ref 96–112)
Creat: 1.84 mg/dL — ABNORMAL HIGH (ref 0.50–1.35)
Phosphorus: 3.3 mg/dL (ref 2.3–4.6)
Potassium: 4.3 mEq/L (ref 3.5–5.3)

## 2013-09-26 MED ORDER — FLUTICASONE PROPIONATE 50 MCG/ACT NA SUSP: 2.0000 | Freq: Every day | NASAL | Status: DC

## 2013-09-26 MED ORDER — CHOLESTYRAMINE 4 GM/DOSE PO POWD: 4.0000 g | Freq: Three times a day (TID) | ORAL | Status: DC

## 2013-09-26 MED ORDER — FLUCONAZOLE 150 MG PO TABS: ORAL_TABLET | ORAL | Status: DC

## 2013-09-26 NOTE — Patient Instructions (Addendum)
Probiotics daily such as [employer]age or generic   Diflucan first, then Questran powder up to 3 x a day as needed If still loose stool can use Imodium as needed  Nasal saline to nose daily and as needed  Diarrhea Diarrhea is frequent loose and watery bowel movements. It can cause you to feel weak and dehydrated. Dehydration can cause you to become tired and thirsty, have a dry mouth, and have decreased urination that often is dark yellow. Diarrhea is a sign of another problem, most often an infection that will not last long. In most cases, diarrhea typically lasts 2 3 days. However, it can last longer if it is a sign of something more serious. It is important to treat your diarrhea as directed by your caregive to lessen or prevent future episodes of diarrhea. CAUSES  Some common causes include:  Gastrointestinal infections caused by viruses, bacteria, or parasites.  Food poisoning or food allergies.  Certain medicines, such as antibiotics, chemotherapy, and laxatives.  Artificial sweeteners and fructose.  Digestive disorders. HOME CARE INSTRUCTIONS  Ensure adequate fluid intake (hydration): have 1 cup (8 oz) of fluid for each diarrhea episode. Avoid fluids that contain simple sugars or sports drinks, fruit juices, whole milk products, and sodas. Your urine should be clear or pale yellow if you are drinking enough fluids. Hydrate with an oral rehydration solution that you can purchase at pharmacies, retail stores, and online. You can prepare an oral rehydration solution at home by mixing the following ingredients together:    tsp table salt.   tsp baking soda.   tsp salt substitute containing potassium chloride.  1  tablespoons sugar.  1 L (34 oz) of water.  Certain foods and beverages may increase the speed at which food moves through the gastrointestinal (GI) tract. These foods and beverages should be avoided and include:  Caffeinated and alcoholic  beverages.  High-fiber foods, such as raw fruits and vegetables, nuts, seeds, and whole grain breads and cereals.  Foods and beverages sweetened with sugar alcohols, such as xylitol, sorbitol, and mannitol.  Some foods may be well tolerated and may help thicken stool including:  Starchy foods, such as rice, toast, pasta, low-sugar cereal, oatmeal, grits, baked potatoes, crackers, and bagels.  Bananas.  Applesauce.  Add probiotic-rich foods to help increase healthy bacteria in the GI tract, such as yogurt and fermented milk products.  Wash your hands well after each diarrhea episode.  Only take over-the-counter or prescription medicines as directed by your caregiver.  Take a warm bath to relieve any burning or pain from frequent diarrhea episodes. SEEK IMMEDIATE MEDICAL CARE IF:   You are unable to keep fluids down.  You have persistent vomiting.  You have blood in your stool, or your stools are black and tarry.  You do not urinate in 6 8 hours, or there is only a small amount of very dark urine.  You have abdominal pain that increases or localizes.  You have weakness, dizziness, confusion, or lightheadedness.  You have a severe headache.  Your diarrhea gets worse or does not get better.  You have a fever or persistent symptoms for more than 2 3 days.  You have a fever and your symptoms suddenly get worse. MAKE SURE YOU:   Understand these instructions.  Will watch your condition.  Will get help right away if you are not doing well or get worse. Document Released: 10/10/2002 Document Revised: 10/06/2012 Document Reviewed: 06/27/2012 [facility] Patient Information 2014 [place], [place].

## 2013-09-26 NOTE — Telephone Encounter (Signed)
Pt was in today for a visit

## 2013-09-26 NOTE — Progress Notes (Signed)
Pre visit review using our clinic review tool, if applicable. No additional management support is needed unless otherwise documented below in the visit note.

## 2013-09-27 ENCOUNTER — Encounter: Payer: Self-pay | Admitting: Family Medicine

## 2013-09-27 NOTE — Progress Notes (Signed)
Patient ID: Darren Mueller, male   DOB: [birth year], 62 y.o.   MRN: [id number] Darren Mueller [id number] [birth year] 09/27/2013      Progress Note-Follow Up  Subjective  Chief Complaint  Chief Complaint  Patient presents with  . Follow-up    4 week    HPI  Patient is a 62 yo male in today for follow up. Has struggled with some increased head congestion.some ear prressure b/l no hearing loss or tinnitus. No other recent illness except persistent diarrhea. On a bad day he will have 4-5 episodes of diarrhea daily.  On a good day 3-4. No bloody or tarry stool no abdominal pain or fevers. NO cp/palp/sob  Past Medical History  Diagnosis Date  . Hypertension   . Seizures   . Hx of colonic polyps   . GERD (gastroesophageal reflux disease)   . Gout   . Alcoholic cirrhosis of liver with ascites 2014  . Unspecified hypothyroidism 01/25/2013  . Unspecified pleural effusion   . Other chronic pulmonary heart diseases   . Hypopotassemia   . Renal insufficiency 07/18/2013  . Diarrhea 09/04/2013    History reviewed. No pertinent past surgical history.  Family History  Problem Relation Age of Onset  . Heart disease Mother   . Prostate cancer Father   . Colon cancer Neg Hx   . Heart attack Mother   . Hypertension Mother     History   Social History  . Marital Status: Divorced    Spouse Name: N/A    Number of Children: 3  . Years of Education: N/A   Occupational History  . Retired     Social research officer, government   Social History Main Topics  . Smoking status: Former Smoker -- 2.00 packs/day for 5 years    Types: Cigarettes    Quit date: 09/22/1979  . Smokeless tobacco: Never Used  . Alcohol Use: Yes     Comment: 3-4 cans of beers and 4-5 drinks of vodka - has not drunk any alcohol in the last few weeks  . Drug Use: No  . Sexual Activity: Not on file   Other Topics Concern  . Not on file   Social History Narrative   0 caffeine drinks daily     Current Outpatient Prescriptions on File  Prior to Visit  Medication Sig Dispense Refill  . allopurinol (ZYLOPRIM) 100 MG tablet Take 1 tablet (100 mg total) by mouth daily.  90 tablet  1  . Doxepin HCl (SILENOR) 6 MG TABS Take by mouth.      . furosemide (LASIX) 40 MG tablet Take 1 tablet (40 mg total) by mouth daily.  30 tablet  6  . levothyroxine (SYNTHROID, LEVOTHROID) 25 MCG tablet TAKE 1 TABLET (25 MCG TOTAL) BY MOUTH DAILY.  30 tablet  3  . losartan (COZAAR) 100 MG tablet Take 1 tablet (100 mg total) by mouth daily.  90 tablet  1  . Macitentan (OPSUMIT) 10 MG TABS Take by mouth.      . mometasone-formoterol (DULERA) 200-5 MCG/ACT AERO Inhale 2 puffs into the lungs 2 (two) times daily.  13 g  6  . omeprazole (PRILOSEC) 40 MG capsule Take 1 capsule (40 mg total) by mouth daily.  90 capsule  3  . potassium chloride (K-DUR) 10 MEQ tablet take4 tabs po (40 MeQ) BID  120 tablet  6  . spironolactone (ALDACTONE) 50 MG tablet Take 1 tablet (50 mg total) by mouth daily.  30 tablet  11  . Tadalafil,  PAH, (ADCIRCA) 20 MG TABS Take 2 tablets (40 mg total) by mouth daily.  180 tablet  3  . verapamil (VERELAN) 180 MG 24 hr capsule Take 1 capsule (180 mg total) by mouth 2 (two) times daily.  180 capsule  0  . zolpidem (AMBIEN) 10 MG tablet Take 1 tablet (10 mg total) by mouth at bedtime as needed for sleep.  30 tablet  2   No current facility-administered medications on file prior to visit.    Allergies  Allergen Reactions  . Aspirin Other (See Comments)    hypertension    Review of Systems  Review of Systems  Constitutional: Negative for fever and malaise/fatigue.  HENT: Negative for congestion.   Eyes: Negative for discharge.  Respiratory: Negative for shortness of breath.   Cardiovascular: Negative for chest pain, palpitations and leg swelling.  Gastrointestinal: Negative for nausea, abdominal pain and diarrhea.  Genitourinary: Negative for dysuria.  Musculoskeletal: Negative for falls.  Skin: Negative for rash.  Neurological:  Negative for loss of consciousness and headaches.  Endo/Heme/Allergies: Negative for polydipsia.  Psychiatric/Behavioral: Negative for depression and suicidal ideas. The patient is not nervous/anxious and does not have insomnia.     Objective  BP 104/68  Pulse 88  Temp(Src) 97.5 F (36.4 C) (Oral)  Ht 6' (1.829 m)  Wt 210 lb (95.255 kg)  BMI 28.47 kg/m2  SpO2 93%  Physical Exam  Physical Exam  Constitutional: He is oriented to person, place, and time and well-developed, well-nourished, and in no distress. No distress.  HENT:  Head: Normocephalic and atraumatic.  Eyes: Conjunctivae are normal.  Neck: Neck supple. No thyromegaly present.  Cardiovascular: Normal rate, regular rhythm and normal heart sounds.   Pulmonary/Chest: Effort normal and breath sounds normal. No respiratory distress.  Abdominal: He exhibits no distension and no mass. There is no tenderness.  Musculoskeletal: He exhibits no edema.  Neurological: He is alert and oriented to person, place, and time.  Skin: Skin is warm.  Psychiatric: Memory, affect and judgment normal.    Lab Results  Component Value Date   TSH 3.229 04/19/2013   Lab Results  Component Value Date   WBC 3.6* 09/26/2013   HGB 12.2* 09/26/2013   HCT 35.1* 09/26/2013   MCV 100.3* 09/26/2013   PLT 73* 09/26/2013   Lab Results  Component Value Date   CREATININE 1.84* 09/26/2013   BUN 37* 09/26/2013   NA 137 09/26/2013   K 4.3 09/26/2013   CL 107 09/26/2013   CO2 20 09/26/2013   Lab Results  Component Value Date   ALT 31 09/26/2013   AST 40* 09/26/2013   ALKPHOS 150* 09/26/2013   BILITOT 0.5 09/26/2013   Lab Results  Component Value Date   CHOL 174 04/19/2013   Lab Results  Component Value Date   HDL 54 04/19/2013   Lab Results  Component Value Date   LDLCALC 65 04/19/2013   Lab Results  Component Value Date   TRIG 274* 04/19/2013   Lab Results  Component Value Date   CHOLHDL 3.2 04/19/2013     Assessment &  Plan  Hypertension Well controlled, no changes.  Allergic rhinitis Fluticasone added, nasal saline and Mucinex prn.   Diarrhea With WBC in stool. Treat with Diflucan and probiotics, given Questran to use to firm stool as well  Unspecified hypothyroidism Stable on current dose of Levothyroxine.

## 2013-10-01 NOTE — Assessment & Plan Note (Signed)
With WBC in stool. Treat with Diflucan and probiotics, given Questran to use to firm stool as well

## 2013-10-01 NOTE — Assessment & Plan Note (Signed)
Stable on current dose of Levothyroxine 

## 2013-10-01 NOTE — Assessment & Plan Note (Signed)
Well controlled, no changes 

## 2013-10-01 NOTE — Assessment & Plan Note (Signed)
Fluticasone added, nasal saline and Mucinex prn.

## 2013-10-17 ENCOUNTER — Inpatient Hospital Stay (HOSPITAL_COMMUNITY)
Admission: EM | Admit: 2013-10-17 | Discharge: 2013-10-19 | DRG: 392 | Disposition: A | Discharge status: Home / Self Care | Attending: Cardiology | Admitting: Cardiology

## 2013-10-17 ENCOUNTER — Emergency Department (HOSPITAL_COMMUNITY)

## 2013-10-17 ENCOUNTER — Encounter (HOSPITAL_COMMUNITY): Payer: Self-pay | Admitting: Emergency Medicine

## 2013-10-17 DIAGNOSIS — K7030 Alcoholic cirrhosis of liver without ascites: Secondary | ICD-10-CM | POA: Diagnosis present

## 2013-10-17 DIAGNOSIS — K219 Gastro-esophageal reflux disease without esophagitis: Secondary | ICD-10-CM | POA: Diagnosis present

## 2013-10-17 DIAGNOSIS — K589 Irritable bowel syndrome without diarrhea: Principal | ICD-10-CM | POA: Diagnosis present

## 2013-10-17 DIAGNOSIS — M545 Low back pain, unspecified: Secondary | ICD-10-CM | POA: Diagnosis present

## 2013-10-17 DIAGNOSIS — Z87891 Personal history of nicotine dependence: Secondary | ICD-10-CM

## 2013-10-17 DIAGNOSIS — I50810 Right heart failure, unspecified: Secondary | ICD-10-CM

## 2013-10-17 DIAGNOSIS — IMO0002 Reserved for concepts with insufficient information to code with codable children: Secondary | ICD-10-CM

## 2013-10-17 DIAGNOSIS — K766 Portal hypertension: Secondary | ICD-10-CM | POA: Diagnosis present

## 2013-10-17 DIAGNOSIS — J4489 Other specified chronic obstructive pulmonary disease: Secondary | ICD-10-CM | POA: Diagnosis present

## 2013-10-17 DIAGNOSIS — I2789 Other specified pulmonary heart diseases: Secondary | ICD-10-CM | POA: Diagnosis present

## 2013-10-17 DIAGNOSIS — I2721 Secondary pulmonary arterial hypertension: Secondary | ICD-10-CM

## 2013-10-17 DIAGNOSIS — N179 Acute kidney failure, unspecified: Secondary | ICD-10-CM

## 2013-10-17 DIAGNOSIS — R55 Syncope and collapse: Secondary | ICD-10-CM

## 2013-10-17 DIAGNOSIS — F1010 Alcohol abuse, uncomplicated: Secondary | ICD-10-CM | POA: Diagnosis present

## 2013-10-17 DIAGNOSIS — I129 Hypertensive chronic kidney disease with stage 1 through stage 4 chronic kidney disease, or unspecified chronic kidney disease: Secondary | ICD-10-CM | POA: Diagnosis present

## 2013-10-17 DIAGNOSIS — N189 Chronic kidney disease, unspecified: Secondary | ICD-10-CM | POA: Diagnosis present

## 2013-10-17 DIAGNOSIS — Z79899 Other long term (current) drug therapy: Secondary | ICD-10-CM

## 2013-10-17 DIAGNOSIS — E860 Dehydration: Secondary | ICD-10-CM | POA: Diagnosis present

## 2013-10-17 DIAGNOSIS — I2729 Other secondary pulmonary hypertension: Secondary | ICD-10-CM

## 2013-10-17 DIAGNOSIS — J449 Chronic obstructive pulmonary disease, unspecified: Secondary | ICD-10-CM | POA: Diagnosis present

## 2013-10-17 DIAGNOSIS — M109 Gout, unspecified: Secondary | ICD-10-CM | POA: Diagnosis present

## 2013-10-17 DIAGNOSIS — E039 Hypothyroidism, unspecified: Secondary | ICD-10-CM | POA: Diagnosis present

## 2013-10-17 LAB — POCT I-STAT, CHEM 8
BUN: 50 mg/dL — ABNORMAL HIGH (ref 6–23)
Calcium, Ion: 1.28 mmol/L (ref 1.13–1.30)
Chloride: 108 mEq/L (ref 96–112)
Creatinine, Ser: 2.7 mg/dL — ABNORMAL HIGH (ref 0.50–1.35)
Glucose, Bld: 106 mg/dL — ABNORMAL HIGH (ref 70–99)
HCT: 37 % — ABNORMAL LOW (ref 39.0–52.0)
Hemoglobin: 12.6 g/dL — ABNORMAL LOW (ref 13.0–17.0)
Potassium: 3.7 mEq/L (ref 3.5–5.1)
Sodium: 138 mEq/L (ref 135–145)
TCO2: 16 mmol/L (ref 0–100)

## 2013-10-17 LAB — POCT I-STAT TROPONIN I: Troponin i, poc: 0 ng/mL (ref 0.00–0.08)

## 2013-10-17 MED ORDER — MORPHINE SULFATE 4 MG/ML IJ SOLN
4.0000 mg | Freq: Once | INTRAMUSCULAR | Status: AC
  Administered 2013-10-17: 4 mg via INTRAVENOUS
  Filled 2013-10-17: qty 1

## 2013-10-17 MED ORDER — SODIUM CHLORIDE 0.9 % IV BOLUS (SEPSIS)
250.0000 mL | Freq: Once | INTRAVENOUS | Status: AC
  Administered 2013-10-17: 250 mL via INTRAVENOUS

## 2013-10-17 MED ORDER — MORPHINE SULFATE 4 MG/ML IJ SOLN
4.0000 mg | Freq: Once | INTRAMUSCULAR | Status: AC
  Administered 2013-10-17: 4 mg via INTRAVENOUS

## 2013-10-17 NOTE — ED Provider Notes (Signed)
CSN: [id number]     Arrival date & time 10/17/13  2004 History   First MD Initiated Contact with Patient 10/17/13 2027     Chief Complaint  Patient presents with  . Loss of Consciousness    HPI Pt arrives via [facility] from a barbershop where pt had syncopal episode. Pt reports he was reaching over to pay the barber when he "passed out" and fell in the barber chair. Pt doesn't remember passing out, denies any head trauma. EMS reports that pt was cool, diaphoretic, pale upon arrival. Reports EKG showed depression in v2, v3, v4. Pt reports some upper abdominal pain and SOB.   Past Medical History  Diagnosis Date  . Hypertension   . Seizures   . Hx of colonic polyps   . GERD (gastroesophageal reflux disease)   . Gout   . Alcoholic cirrhosis of liver with ascites 2014  . Unspecified hypothyroidism 01/25/2013  . Unspecified pleural effusion   . Other chronic pulmonary heart diseases   . Hypopotassemia   . Renal insufficiency 07/18/2013  . Diarrhea 09/04/2013   History reviewed. No pertinent past surgical history. Family History  Problem Relation Age of Onset  . Heart disease Mother   . Prostate cancer Father   . Colon cancer Neg Hx   . Heart attack Mother   . Hypertension Mother    History  Substance Use Topics  . Smoking status: Former Smoker -- 2.00 packs/day for 5 years    Types: Cigarettes    Quit date: 09/22/1979  . Smokeless tobacco: Never Used  . Alcohol Use: Yes     Comment: 3-4 cans of beers and 4-5 drinks of vodka - has not drunk any alcohol in the last few weeks    Review of Systems  Constitutional: Negative for chills and diaphoresis.  Cardiovascular: Negative for chest pain and leg swelling.  Neurological: Negative for numbness.  All other systems reviewed and are negative.    Allergies  Aspirin  Home Medications   Current Outpatient Rx  Name  Route  Sig  Dispense  Refill  . allopurinol (ZYLOPRIM) 100 MG tablet   Oral   Take 1 tablet (100 mg total) by  mouth daily.   90 tablet   1   . cholestyramine (QUESTRAN) 4 GM/DOSE powder   Oral   Take 1 packet (4 g total) by mouth 3 (three) times daily with meals.   378 g   3   . Doxepin HCl (SILENOR) 6 MG TABS   Oral   Take 6 mg by mouth daily.          . fluconazole (DIFLUCAN) 150 MG tablet      2 tabs po daily x 3 days then 1 tab po q week x 4 weeks   10 tablet   0   . fluticasone (FLONASE) 50 MCG/ACT nasal spray   Each Nare   Place 2 sprays into both nostrils daily.   16 g   6   . furosemide (LASIX) 40 MG tablet   Oral   Take 1 tablet (40 mg total) by mouth daily.   30 tablet   6   . levothyroxine (SYNTHROID, LEVOTHROID) 25 MCG tablet      TAKE 1 TABLET (25 MCG TOTAL) BY MOUTH DAILY.   30 tablet   3   . losartan (COZAAR) 100 MG tablet   Oral   Take 1 tablet (100 mg total) by mouth daily.   90 tablet   1   .  Macitentan (OPSUMIT) 10 MG TABS   Oral   Take 10 mg by mouth daily.          . mometasone-formoterol (DULERA) 200-5 MCG/ACT AERO   Inhalation   Inhale 2 puffs into the lungs 2 (two) times daily.   13 g   6   . omeprazole (PRILOSEC) 40 MG capsule   Oral   Take 1 capsule (40 mg total) by mouth daily.   90 capsule   3     Office visit due   . potassium chloride (K-DUR) 10 MEQ tablet      take4 tabs po (40 MeQ) BID   120 tablet   6     DOSE CHANGE. PLEASE FILL   . spironolactone (ALDACTONE) 50 MG tablet   Oral   Take 1 tablet (50 mg total) by mouth daily.   30 tablet   11   . Tadalafil, PAH, (ADCIRCA) 20 MG TABS   Oral   Take 2 tablets (40 mg total) by mouth daily.   180 tablet   3   . verapamil (VERELAN) 180 MG 24 hr capsule   Oral   Take 1 capsule (180 mg total) by mouth 2 (two) times daily.   180 capsule   0   . zolpidem (AMBIEN) 10 MG tablet   Oral   Take 1 tablet (10 mg total) by mouth at bedtime as needed for sleep.   30 tablet   2    BP 115/71  Pulse 80  Temp(Src) 97.6 F (36.4 C)  Resp 16  SpO2 97% Physical  Exam  Nursing note and vitals reviewed. Constitutional: He is oriented to person, place, and time. He appears well-developed and well-nourished. No distress.  HENT:  Head: Normocephalic and atraumatic.  Eyes: Pupils are equal, round, and reactive to light.  Neck: Normal range of motion.  Cardiovascular: Normal rate and intact distal pulses.   Pulmonary/Chest: No respiratory distress.  Abdominal: Normal appearance. He exhibits no distension. There is no tenderness. There is no rebound.  Musculoskeletal: Normal range of motion.       Back:  Neurological: He is alert and oriented to person, place, and time. No cranial nerve deficit.  Skin: Skin is warm and dry. No rash noted.  Psychiatric: He has a normal mood and affect. His behavior is normal.    ED Course  Procedures (including critical care time) Labs Review Labs Reviewed  POCT I-STAT, CHEM 8 - Abnormal; Notable for the following:    BUN 50 (*)    Creatinine, Ser 2.70 (*)    Glucose, Bld 106 (*)    Hemoglobin 12.6 (*)    HCT 37.0 (*)    All other components within normal limits  POCT I-STAT TROPONIN I   Imaging Review Dg Chest Portable 1 View  10/17/2013   CLINICAL DATA:  Loss of consciousness, chest and back pain, weakness  EXAM: PORTABLE CHEST - 1 VIEW  COMPARISON:  01/11/2013; 12/29/2012; 11/26/2012; chest CT -11/30/2012  FINDINGS: Grossly unchanged enlarged cardiac silhouette and mediastinal contours. Mild pulmonary venous congestion without frank evidence of edema. There is persistent mild elevation of the right hemidiaphragm. A subpulmonic right-sided pleural effusion is not excluded on the basis of this examination. Grossly unchanged bilateral infrahilar heterogeneous opacities, likely atelectasis. No new focal airspace opacities. No pleural effusion. Unchanged bones.  IMPRESSION: 1. Cardiomegaly and pulmonary venous congestion without definite acute cardiopulmonary disease. 2. Mild apparent elevation of the right  hemidiaphragm is favored to be secondary  to a grossly unchanged small subpulmonic pleural effusion as demonstrated on prior examinations.   Electronically Signed   By: Sandi Mariscal M.D.   On: 10/17/2013 21:36    EKG Interpretation    Date/Time:  Monday October 17 2013 20:21:35 EST Ventricular Rate:  82 PR Interval:  262 QRS Duration: 101 QT Interval:  409 QTC Calculation: 478 R Axis:   89 Text Interpretation:  Sinus rhythm Prolonged PR interval Borderline right axis deviation Posterior infarct, acute (LCx) ST depression V1-V3, suggest recording posterior leads Confirmed by Dima Ferrufino  MD, Shawne Bulow ([id number]) on 10/17/2013 8:42:56 PM            MDM   1. Syncope   2. PAH (pulmonary arterial hypertension) with portal hypertension   3. AKI (acute kidney injury)        Dot Lanes, MD 10/17/13 [phone]

## 2013-10-17 NOTE — ED Notes (Signed)
Md Audie Pinto at bedside doing abdominal ultrasound. No critical findings at this time.

## 2013-10-17 NOTE — ED Notes (Signed)
Pt arrives via [facility] from a barbershop where pt had syncopal episode. Pt reports he was reaching over to pay the barber when he "passed out" and fell in the barber chair. Pt doesn't remember passing out, denies any head trauma. EMS reports that pt was cool, diaphoretic, pale upon arrival. Reports EKG showed depression in v2, v3, v4. Pt reports some upper abdominal pain and SOB.

## 2013-10-17 NOTE — H&P (Addendum)
Physician History and Physical    Darren Mueller MRN: [id number] DOB/AGE: January 27, 1951 62 y.o. Admit date: 10/17/2013  Primary Care Physician: Dr Darren Mueller Primary Cardiologist: Dr. Haroldine Mueller  HPI:  62 yo with history of COPD, portopulmonary hypertension with RV failure, and ETOH cirrhosis presents after syncopal episode.  Over the past 2 weeks, he has had loose stool 2-3 times/day every day.  He was started on cholestyramine by his PCP.  He does not think that he was particularly dehydrated, however.  No lightheadedness with standing prior to today.  Patient has been stable in terms of exertional symptoms.  He tends to get short of breath after walking about 50-100 yards but is able to do most activities without too much trouble.  No chest pain.  No prior history of syncope.  Adcirca was increased to 40 mg daily back in 10/14 with no problems.   Today, patient was in the barbershop getting his hair cut.  He had been sitting for about 30 minutes then stood to leave.  He felt lightheaded then apparently passed out and fell back into the chair.  Witnesses say that his eyes rolled back in his head and he looked "stiff."  He is not sure how long he was unconscious.  EMS was called and he was brought to the ER.  BP has been stable.  He has not been lightheaded.  Main complaint in ER has been low back pain.  He has never had low back pain before but noted this after the syncopal event.  It is fairly severe but relieved by morphine.  ECG is unchanged, no arrhythmias in the ER.  No tachypalpitations.    Of note, creatinine is up to 2.7 from 1.7 baseline.   PMH: 1.  Gout 2.  HTN 3.  Hypothyroidism 4.  Pulmonary arterial HTN: Suspected portopulmonary HTN in the setting of ETOH cirrhosis.  RHC (2/14) with mean RA 13, PA 65/24 mean 41, PCWP 6, PVR 9.1, CI 1.8.  Echo (5/14) with LV EF 55%, severe RV dilation, PA systolic pressure 71 mmHg.  5.  COPD: Uses oxygen at night.  6.  H/o ETOH abuse.  7.  Cirrhosis:  Likely 2/2 ETOH.  Varices noted on 2010 EGD. 8.  H/o right pleural effusion.  9.  GERD 10.  CKD  Review of systems complete and found to be negative unless listed above   Family History  Problem Relation Age of Onset  . Heart disease Mother   . Prostate cancer Father   . Colon cancer Neg Hx   . Heart attack Mother   . Hypertension Mother     History   Social History  . Marital Status: Divorced    Spouse Name: N/A    Number of Children: 3  . Years of Education: N/A   Occupational History  . Retired     Social research officer, government   Social History Main Topics  . Smoking status: Former Smoker -- 2.00 packs/day for 5 years    Types: Cigarettes    Quit date: 09/22/1979  . Smokeless tobacco: Never Used  . Alcohol Use: Yes     Comment: 3-4 cans of beers and 4-5 drinks of vodka - has not drunk any alcohol in the last few weeks  . Drug Use: No  . Sexual Activity: Not on file   Other Topics Concern  . Not on file   Social History Narrative   0 caffeine drinks daily     Physical Exam: Blood  pressure 112/65, pulse 80, temperature 97.6 F (36.4 C), resp. rate 19, SpO2 97.00%.  General: NAD Neck: JVP 8 cm, no thyromegaly or thyroid nodule.  Lungs: Clear to auscultation bilaterally with normal respiratory effort. CV: Nondisplaced PMI.  Heart regular S1/S2, wide split S2, 1/6 HSM LLSB.  No peripheral edema.  No carotid bruit.  Normal pedal pulses.  Abdomen: Soft, nontender, no hepatosplenomegaly, no distention.  Skin: Intact without lesions or rashes.  Neurologic: Alert and oriented x 3.  Psych: Normal affect. Extremities: No clubbing or cyanosis.  HEENT: Normal.   Labs:   Lab Results  Component Value Date   WBC 3.6* 09/26/2013   HGB 12.6* 10/17/2013   HCT 37.0* 10/17/2013   MCV 100.3* 09/26/2013   PLT 73* 09/26/2013    Recent Labs Lab 10/17/13 2102  NA 138  K 3.7  CL 108  BUN 50*  CREATININE 2.70*  GLUCOSE 106*   TnI 0   Radiology: - CXR: Stable small subpulmonic  pleural effusion.   EKG: NSR, anteroseptal TWIs, RVH (no change from 3/14).   ASSESSMENT AND PLAN:  62 yo with history of COPD, portopulmonary hypertension with RV failure, and ETOH cirrhosis presents after syncopal episode.  1. Syncope: Patient has history of pulmonary arterial HTN.  This has been stable recently.  He also has had diarrhea, but somewhat low grade, for about 2-3 weeks.  It is possible that syncope could be related to a degree of dehydration in the setting of ongoing use of Lasix and anti-hypertensives.  BP was 93/63 at admission but has been in the 017B systolic since then.  Arrhythmia is possible but there is no history of this.  Worsening pulmonary HTN with syncope is possible but symptoms have been stable recently and 6 minute walk in the office has been good in general (last in 10/14). Rise in creatinine supports some degree of dehydration.  - Hold Lasix and losartan for now.  - Gentle, self-limited IV fluid - Repeat echo  - Telemetry overnight, consider event monitor.  2. Pulmonary arterial HTN: Suspected portopulmonary HTN.  Class III symptoms but has been stable and 6 minute walks have been good in the office, most recently in 10/14.  Syncope could accompany worsening PAH but not much else to suggest this.  - Echo to reassess RV and PA pressure.  - Continue macitentan and tadalafil at current doses.  3. AKI: ?due to dehydration.  As above, gentle fluids, hold Lasix, will send urine sodium.  4. Back pain: New for him.  Low back pain.  ? Injury when he fell back into barber's chair with syncope. Will get back films in ER.  Will get UA to look for hematuria (?nephrolithiasis).  5. Diarrhea: Chronic, seems relatively low grade.  Check C difficile.  6. H/o ETOH abuse: He is still drinking but much less than before.  Encouraged complete abstinence.   Loralie Champagne 10/17/2013 11:41 PM

## 2013-10-17 NOTE — ED Notes (Signed)
Patient transported to X-ray 

## 2013-10-17 NOTE — ED Notes (Signed)
EKg shown to Dr. Audie Pinto, copies in the chart

## 2013-10-18 DIAGNOSIS — I517 Cardiomegaly: Secondary | ICD-10-CM

## 2013-10-18 LAB — CBC
MCH: 34.4 pg — ABNORMAL HIGH (ref 26.0–34.0)
MCHC: 34.1 g/dL (ref 30.0–36.0)
MCV: 100.9 fL — ABNORMAL HIGH (ref 78.0–100.0)
Platelets: 67 10*3/uL — ABNORMAL LOW (ref 150–400)
RDW: 12.6 % (ref 11.5–15.5)
WBC: 3.5 10*3/uL — ABNORMAL LOW (ref 4.0–10.5)

## 2013-10-18 LAB — URINALYSIS, ROUTINE W REFLEX MICROSCOPIC
Hgb urine dipstick: NEGATIVE
Ketones, ur: NEGATIVE mg/dL
Leukocytes, UA: NEGATIVE
Protein, ur: NEGATIVE mg/dL
Specific Gravity, Urine: 1.017 (ref 1.005–1.030)
Urobilinogen, UA: 0.2 mg/dL (ref 0.0–1.0)

## 2013-10-18 LAB — CREATININE, SERUM: Creatinine, Ser: 2.45 mg/dL — ABNORMAL HIGH (ref 0.50–1.35)

## 2013-10-18 LAB — FERRITIN: Ferritin: 500 ng/mL — ABNORMAL HIGH (ref 22–322)

## 2013-10-18 LAB — VITAMIN B12: Vitamin B-12: 287 pg/mL (ref 211–911)

## 2013-10-18 LAB — SODIUM, URINE, RANDOM: Sodium, Ur: 51 mEq/L

## 2013-10-18 LAB — RETICULOCYTES
RBC.: 3.13 MIL/uL — ABNORMAL LOW (ref 4.22–5.81)
Retic Count, Absolute: 40.7 10*3/uL (ref 19.0–186.0)
Retic Ct Pct: 1.3 % (ref 0.4–3.1)

## 2013-10-18 LAB — TSH: TSH: 3.556 u[IU]/mL (ref 0.350–4.500)

## 2013-10-18 LAB — PRO B NATRIURETIC PEPTIDE: Pro B Natriuretic peptide (BNP): 619.8 pg/mL — ABNORMAL HIGH (ref 0–125)

## 2013-10-18 LAB — IRON AND TIBC: Saturation Ratios: 30 % (ref 20–55)

## 2013-10-18 MED ORDER — MORPHINE SULFATE 2 MG/ML IJ SOLN
2.0000 mg | INTRAMUSCULAR | Status: DC | PRN
  Administered 2013-10-18: 4 mg via INTRAVENOUS
  Filled 2013-10-18: qty 2

## 2013-10-18 MED ORDER — VERAPAMIL HCL ER 180 MG PO TBCR
180.0000 mg | EXTENDED_RELEASE_TABLET | Freq: Two times a day (BID) | ORAL | Status: DC
  Administered 2013-10-18 – 2013-10-19 (×4): 180 mg via ORAL
  Filled 2013-10-18 (×5): qty 1

## 2013-10-18 MED ORDER — HEPARIN SODIUM (PORCINE) 5000 UNIT/ML IJ SOLN
5000.0000 [IU] | Freq: Three times a day (TID) | INTRAMUSCULAR | Status: DC
  Administered 2013-10-18 – 2013-10-19 (×4): 5000 [IU] via SUBCUTANEOUS
  Filled 2013-10-18 (×7): qty 1

## 2013-10-18 MED ORDER — LEVOTHYROXINE SODIUM 25 MCG PO TABS
25.0000 ug | ORAL_TABLET | Freq: Every day | ORAL | Status: DC
  Administered 2013-10-18 – 2013-10-19 (×2): 25 ug via ORAL
  Filled 2013-10-18 (×5): qty 1

## 2013-10-18 MED ORDER — SODIUM CHLORIDE 0.9 % IV SOLN: 250.0000 mL | INTRAVENOUS | Status: DC | PRN

## 2013-10-18 MED ORDER — CHOLESTYRAMINE 4 G PO PACK
4.0000 g | PACK | Freq: Three times a day (TID) | ORAL | Status: DC
  Administered 2013-10-18 (×3): 4 g via ORAL
  Filled 2013-10-18 (×6): qty 1

## 2013-10-18 MED ORDER — FLUTICASONE PROPIONATE 50 MCG/ACT NA SUSP
2.0000 | Freq: Every day | NASAL | Status: DC
  Administered 2013-10-18 – 2013-10-19 (×2): 2 via NASAL
  Filled 2013-10-18: qty 16

## 2013-10-18 MED ORDER — ZOLPIDEM TARTRATE 5 MG PO TABS: 10.0000 mg | ORAL_TABLET | Freq: Every evening | ORAL | Status: DC | PRN

## 2013-10-18 MED ORDER — ONDANSETRON HCL 4 MG/2ML IJ SOLN: 4.0000 mg | Freq: Four times a day (QID) | INTRAMUSCULAR | Status: DC | PRN

## 2013-10-18 MED ORDER — VERAPAMIL HCL ER 180 MG PO CP24: 180.0000 mg | ORAL_CAPSULE | Freq: Two times a day (BID) | ORAL | Status: DC

## 2013-10-18 MED ORDER — CHOLESTYRAMINE 4 GM/DOSE PO POWD: 4.0000 g | Freq: Three times a day (TID) | ORAL | Status: DC

## 2013-10-18 MED ORDER — SODIUM CHLORIDE 0.9 % IJ SOLN: 3.0000 mL | INTRAMUSCULAR | Status: DC | PRN

## 2013-10-18 MED ORDER — ACETAMINOPHEN 325 MG PO TABS: 650.0000 mg | ORAL_TABLET | ORAL | Status: DC | PRN

## 2013-10-18 MED ORDER — TADALAFIL (PAH) 20 MG PO TABS
40.0000 mg | ORAL_TABLET | Freq: Every day | ORAL | Status: DC
  Administered 2013-10-18 – 2013-10-19 (×2): 40 mg via ORAL
  Filled 2013-10-18 (×2): qty 2

## 2013-10-18 MED ORDER — SODIUM CHLORIDE 0.9 % IV BOLUS (SEPSIS)
500.0000 mL | Freq: Once | INTRAVENOUS | Status: AC
  Administered 2013-10-18: 500 mL via INTRAVENOUS

## 2013-10-18 MED ORDER — MACITENTAN 10 MG PO TABS
10.0000 mg | ORAL_TABLET | Freq: Every day | ORAL | Status: DC
  Administered 2013-10-18 – 2013-10-19 (×2): 10 mg via ORAL
  Filled 2013-10-18: qty 1

## 2013-10-18 MED ORDER — SODIUM CHLORIDE 0.9 % IJ SOLN
3.0000 mL | Freq: Two times a day (BID) | INTRAMUSCULAR | Status: DC
  Administered 2013-10-18 (×2): 3 mL via INTRAVENOUS

## 2013-10-18 MED ORDER — MOMETASONE FURO-FORMOTEROL FUM 200-5 MCG/ACT IN AERO
2.0000 | INHALATION_SPRAY | Freq: Two times a day (BID) | RESPIRATORY_TRACT | Status: DC
  Administered 2013-10-18 – 2013-10-19 (×3): 2 via RESPIRATORY_TRACT
  Filled 2013-10-18: qty 8.8

## 2013-10-18 MED ORDER — ALLOPURINOL 100 MG PO TABS
100.0000 mg | ORAL_TABLET | Freq: Every day | ORAL | Status: DC
  Administered 2013-10-18 – 2013-10-19 (×2): 100 mg via ORAL
  Filled 2013-10-18 (×2): qty 1

## 2013-10-18 MED ORDER — PANTOPRAZOLE SODIUM 40 MG PO TBEC
40.0000 mg | DELAYED_RELEASE_TABLET | Freq: Every day | ORAL | Status: DC
  Administered 2013-10-18 – 2013-10-19 (×2): 40 mg via ORAL
  Filled 2013-10-18 (×3): qty 1

## 2013-10-18 NOTE — Progress Notes (Signed)
  Echocardiogram 2D Echocardiogram has been performed.  Devani Odonnel 10/18/2013, 11:18 AM

## 2013-10-18 NOTE — Progress Notes (Signed)
Advanced Heart Failure Rounding Note   Subjective:    62 yo with history of COPD, portopulmonary hypertension with RV failure, and ETOH cirrhosis presents after syncopal episode. Over the past 2 weeks, he has had loose stool 2-3 times/day every day. He was started on cholestyramine by his PCP. He does not think that he was particularly dehydrated, however. No lightheadedness with standing prior to today. Patient has been stable in terms of exertional symptoms. He tends to get short of breath after walking about 50-100 yards but is able to do most activities without too much trouble. No chest pain. No prior history of syncope. Adcirca was increased to 40 mg daily back in 10/14 with no problems.   Admit with syncope. Creatinine was up to 2.7 from 1.7 baseline. Weight at home trending down.   Received 500 cc NS bolus. Yesterday diuretics and losartan held. Repeat ECHO pending. Denies SOB/dizziness. Complaining of R lower back pain. Has diarrhea after he eats.   Creatinine 2.7>2.45   Objective:   Weight Range:  Vital Signs:   Temp:  [97.2 F (36.2 C)-98.6 F (37 C)] 98.6 F (37 C) (12/16 0410) Pulse Rate:  [72-85] 78 (12/16 0410) Resp:  [16-24] 16 (12/16 0410) BP: (93-125)/(59-81) 107/65 mmHg (12/16 0410) SpO2:  [95 %-99 %] 96 % (12/16 0410) Weight:  [200 lb 6.4 oz (90.9 kg)-200 lb 6.4 oz (90.901 kg)] 200 lb 6.4 oz (90.9 kg) (12/16 0136) Last BM Date: 10/17/13  Weight change: Filed Weights   10/18/13 0126 10/18/13 0136  Weight: 200 lb 6.4 oz (90.901 kg) 200 lb 6.4 oz (90.9 kg)    Intake/Output:   Intake/Output Summary (Last 24 hours) at 10/18/13 0839 Last data filed at 10/18/13 0700  Gross per 24 hour  Intake    240 ml  Output    200 ml  Net     40 ml     Physical Exam: General: NAD  Neck: JVP flat, no thyromegaly or thyroid nodule.  Lungs: Clear to auscultation bilaterally with normal respiratory effort.  CV: Nondisplaced PMI. Heart regular S1/S2, wide split S2, 2/6 HSM  LLSB. No peripheral edema. No carotid bruit. Normal pedal pulses.  Abdomen: Soft, nontender, no hepatosplenomegaly, no distention.  Skin: Intact without lesions or rashes.  Neurologic: Alert and oriented x 3.  Psych: Normal affect.  Extremities: No clubbing or cyanosis.  HEENT: Normal.    Telemetry: SR 90s   Labs: Basic Metabolic Panel:  Recent Labs Lab 10/17/13 2102 10/18/13 0205  NA 138  --   K 3.7  --   CL 108  --   GLUCOSE 106*  --   BUN 50*  --   CREATININE 2.70* 2.45*    Liver Function Tests: No results found for this basename: AST, ALT, ALKPHOS, BILITOT, PROT, ALBUMIN,  in the last 168 hours No results found for this basename: LIPASE, AMYLASE,  in the last 168 hours No results found for this basename: AMMONIA,  in the last 168 hours  CBC:  Recent Labs Lab 10/17/13 2102 10/18/13 0205  WBC  --  3.5*  HGB 12.6* 10.9*  HCT 37.0* 32.0*  MCV  --  100.9*  PLT  --  67*    Cardiac Enzymes:  Recent Labs Lab 10/18/13 0205  TROPONINI <0.30    BNP: BNP (last 3 results)  Recent Labs  08/29/13 0936 10/18/13 0205  PROBNP 1262.0* 619.8*     Other results:  :   Imaging: Dg Thoracic Spine W/swimmers  10/18/2013  CLINICAL DATA:  Low back pain, no injury, history cirrhosis, hypertension, renal insufficiency  EXAM: THORACIC SPINE - 2 VIEW + SWIMMERS  COMPARISON:  Chest radiographs 01/11/2013  FINDINGS: Twelve pairs of ribs.  Mild osseous demineralization.  Vertebral body and disc space heights maintained.  No acute fracture, subluxation or bone destruction.  Visualized posterior ribs unremarkable.  IMPRESSION: No acute osseous abnormalities.   Electronically Signed   By: Lavonia Dana M.D.   On: 10/18/2013 00:18   Dg Lumbar Spine Complete  10/18/2013   CLINICAL DATA:  Low back pain.  No known injury  EXAM: LUMBAR SPINE - COMPLETE 4+ VIEW  COMPARISON:  None.  FINDINGS: There is no evidence of lumbar spine fracture. Alignment is normal. Intervertebral disc  spaces are maintained. Abdominal aortic atherosclerosis.  IMPRESSION: Negative.   Electronically Signed   By: Jorje Guild M.D.   On: 10/18/2013 00:27   Dg Chest Portable 1 View  10/17/2013   CLINICAL DATA:  Loss of consciousness, chest and back pain, weakness  EXAM: PORTABLE CHEST - 1 VIEW  COMPARISON:  01/11/2013; 12/29/2012; 11/26/2012; chest CT -11/30/2012  FINDINGS: Grossly unchanged enlarged cardiac silhouette and mediastinal contours. Mild pulmonary venous congestion without frank evidence of edema. There is persistent mild elevation of the right hemidiaphragm. A subpulmonic right-sided pleural effusion is not excluded on the basis of this examination. Grossly unchanged bilateral infrahilar heterogeneous opacities, likely atelectasis. No new focal airspace opacities. No pleural effusion. Unchanged bones.  IMPRESSION: 1. Cardiomegaly and pulmonary venous congestion without definite acute cardiopulmonary disease. 2. Mild apparent elevation of the right hemidiaphragm is favored to be secondary to a grossly unchanged small subpulmonic pleural effusion as demonstrated on prior examinations.   Electronically Signed   By: Sandi Mariscal M.D.   On: 10/17/2013 21:36      Medications:     Scheduled Medications: . allopurinol  100 mg Oral Daily  . cholestyramine  4 g Oral TID  . fluticasone  2 spray Each Nare Daily  . heparin  5,000 Units Subcutaneous Q8H  . levothyroxine  25 mcg Oral QAC breakfast  . Macitentan  10 mg Oral Daily  . mometasone-formoterol  2 puff Inhalation BID  . pantoprazole  40 mg Oral Daily  . sodium chloride  500 mL Intravenous Once  . sodium chloride  3 mL Intravenous Q12H  . Tadalafil (PAH)  40 mg Oral Daily  . verapamil  180 mg Oral BID     Infusions:     PRN Medications:  sodium chloride, acetaminophen, morphine injection, ondansetron (ZOFRAN) IV, sodium chloride, zolpidem   Assessment:  1. Syncope 2. PAH - suspected portopulmonary HTN  3. A/C renal  failure creatinine baseline .17 4. Back Pain 5. Diarrhea- cd iff negative 6. ETOH abuse -    Plan/Discussion:    Improved this morning but ongoing back pain. Creatinine remains elevated but trending down. Continue to hold diuretics and losartan. Give another 500 cc NS bolus. Consult cardiac rehab.   Continue macitentan and adcirca 40 mg daily.   Repeat ECHO pending.   Unsure of the cause of back pain. Lumbar Xray ok. Check UA.   C diff negative. Check ova parasites.    Length of Stay: 1  CLEGG,AMY NP-C 10/18/2013, 8:39 AM  Advanced Heart Failure Team Pager [phone] (M-F; [place])  Please contact [facility] for night-coverage after hours (4p -7a ) and weekends on [email or web address]  Patient seen and examined with Darrick Grinder, NP. We discussed all aspects of  the encounter. I agree with the assessment and plan as stated above.   Syncope related to volume depletion due to diarrhea/irritable bowel syndrome in the setting of [place]. Weight down 10 pounds. Agree with holding diuretics and losartan. Hydrate gently. Check echo. No arrhythmias on tele. Renal function getting better.   Will need outpatient event monitor on d/c.   Suspect he has IBS. C. Diff negative. Check Ova & Parasites. May need GI work-up.   Daniel Bensimhon,MD 1:00 PM

## 2013-10-18 NOTE — Care Management Note (Unsigned)
    Page 1 of 1   10/18/2013     10:54:44 AM   CARE MANAGEMENT NOTE 10/18/2013  Patient:  Darren Mueller, Darren Mueller   Account Number:  [id number]  Date Initiated:  10/18/2013  Documentation initiated by:  Shana Younge  Subjective/Objective Assessment:   PT ADM ON 10/17/13 AFTER SYNCOPAL EPISODE.  PTA, PT INDEPENDENT, LIVES WITH FAMILY.     Action/Plan:   WILL FOLLOW FOR DISCHARGE NEEDS AS PT PROGRESSES.   Anticipated DC Date:  10/19/2013   Anticipated DC Plan:  [place]  CM consult      Choice offered to / List presented to:             Status of service:  In process, will continue to follow Medicare Important Message given?   (If response is "NO", the following Medicare IM given date fields will be blank) Date Medicare IM given:   Date Additional Medicare IM given:    Discharge Disposition:    Per UR Regulation:  Reviewed for med. necessity/level of care/duration of stay  If discussed at [place] of Stay Meetings, dates discussed:    Comments:

## 2013-10-18 NOTE — Progress Notes (Signed)
Patient's medication:  Macitentan TABS 10 MG were brought to hospital by patient's family. Count verified with patient and taken down to main pharmacy. Darren Mueller R

## 2013-10-19 DIAGNOSIS — I509 Heart failure, unspecified: Secondary | ICD-10-CM

## 2013-10-19 LAB — BASIC METABOLIC PANEL
BUN: 42 mg/dL — ABNORMAL HIGH (ref 6–23)
Calcium: 9.1 mg/dL (ref 8.4–10.5)
GFR calc Af Amer: 45 mL/min — ABNORMAL LOW (ref >90)
GFR calc non Af Amer: 39 mL/min — ABNORMAL LOW (ref >90)
Glucose, Bld: 105 mg/dL — ABNORMAL HIGH (ref 70–99)
Potassium: 4.1 mEq/L (ref 3.5–5.1)

## 2013-10-19 MED ORDER — SPIRONOLACTONE 50 MG PO TABS
50.0000 mg | ORAL_TABLET | Freq: Every day | ORAL | Status: DC
  Administered 2013-10-19: 50 mg via ORAL
  Filled 2013-10-19: qty 1

## 2013-10-19 MED ORDER — FUROSEMIDE 40 MG PO TABS: 40.0000 mg | ORAL_TABLET | ORAL | Status: DC | PRN

## 2013-10-19 NOTE — Progress Notes (Signed)
Went over discharge instructions with the patient. IV was d/c'd. Patient taken off the cardiac monitor.  Patient ready for discharge. Elmarie Shiley R

## 2013-10-19 NOTE — Discharge Summary (Signed)
Advanced Heart Failure Team  Discharge Summary   Patient ID: Darren Mueller MRN: [id number], DOB/AGE: August 18, 1951 62 y.o. Admit date: 10/17/2013 D/C date:     10/19/2013   Primary Discharge Diagnoses:   . Syncope  2. PAH - suspected portopulmonary HTN  3. A/C renal failure creatinine baseline 1.7  4. Back Pain- resolved  5. Diarrhea- cd iff negative  6. ETOH abuse -       Hospital Course:  Darren Mueller is 62 yo with history of COPD, portopulmonary hypertension with RV failure, and ETOH cirrhosis presents after syncopal episode.  Adcirca was increased to 40 mg daily back in 10/14 with no problems.   He presented to [facility] ED via EMS after a syncopal episode [place] the [employer]. Prior to admit he was struggling with diarrhea after eating meals and he was started on questran with no improvement. Admit creatinine was 2.7 which was thought to be from volume depletion.  Diuretics and losartan held and he was given 1 liters of normal saline. He continued to improve with volume replacement and his creatinine was back down to his baseline 1.7 on discharge. No further presyncope or syncope was noted. Home diuretics have been adjusted and he will continue spironolactone and only take lasix 40 mg as needed for weight 208 pounds or greater. Will need to restart losartan in the outpatient setting.   Due to frequent diarrhea prior to admit his stool was checked and he was negative for C-Diff. While hospitalized diarrhea resolved. May need GI follow up if diarrhea develops again.   Plan to place 30 day event monitor. He will continue to be followed closely in HF clinic with follow up 10/25/13 at 9:20. Plan to recheck bmet at that time.     Discharge Weight Range: Discharge weight 200 pounds Discharge Vitals: Blood pressure 102/66, pulse 89, temperature 97.6 F (36.4 C), temperature source Oral, resp. rate 18, height _0  (1.854 m), weight 200 lb 9.6 oz (90.992 kg), SpO2 95.00%.  Labs: Lab Results   Component Value Date   WBC 3.5* 10/18/2013   HGB 10.9* 10/18/2013   HCT 32.0* 10/18/2013   MCV 100.9* 10/18/2013   PLT 67* 10/18/2013     Recent Labs Lab 10/19/13 0535  NA 135  K 4.1  CL 107  CO2 17*  BUN 42*  CREATININE 1.79*  CALCIUM 9.1  GLUCOSE 105*   Lab Results  Component Value Date   CHOL 174 04/19/2013   HDL 54 04/19/2013   LDLCALC 65 04/19/2013   TRIG 274* 04/19/2013   BNP (last 3 results)  Recent Labs  08/29/13 0936 10/18/13 0205  PROBNP 1262.0* 619.8*    Diagnostic Studies/Procedures   Dg Thoracic Spine W/swimmers  10/18/2013   CLINICAL DATA:  Low back pain, no injury, history cirrhosis, hypertension, renal insufficiency  EXAM: THORACIC SPINE - 2 VIEW + SWIMMERS  COMPARISON:  Chest radiographs 01/11/2013  FINDINGS: Twelve pairs of ribs.  Mild osseous demineralization.  Vertebral body and disc space heights maintained.  No acute fracture, subluxation or bone destruction.  Visualized posterior ribs unremarkable.  IMPRESSION: No acute osseous abnormalities.   Electronically Signed   By: Lavonia Dana M.D.   On: 10/18/2013 00:18   Dg Lumbar Spine Complete  10/18/2013   CLINICAL DATA:  Low back pain.  No known injury  EXAM: LUMBAR SPINE - COMPLETE 4+ VIEW  COMPARISON:  None.  FINDINGS: There is no evidence of lumbar spine fracture. Alignment is normal. Intervertebral disc spaces are  maintained. Abdominal aortic atherosclerosis.  IMPRESSION: Negative.   Electronically Signed   By: Jorje Guild M.D.   On: 10/18/2013 00:27   Dg Chest Portable 1 View  10/17/2013   CLINICAL DATA:  Loss of consciousness, chest and back pain, weakness  EXAM: PORTABLE CHEST - 1 VIEW  COMPARISON:  01/11/2013; 12/29/2012; 11/26/2012; chest CT -11/30/2012  FINDINGS: Grossly unchanged enlarged cardiac silhouette and mediastinal contours. Mild pulmonary venous congestion without frank evidence of edema. There is persistent mild elevation of the right hemidiaphragm. A subpulmonic right-sided  pleural effusion is not excluded on the basis of this examination. Grossly unchanged bilateral infrahilar heterogeneous opacities, likely atelectasis. No new focal airspace opacities. No pleural effusion. Unchanged bones.  IMPRESSION: 1. Cardiomegaly and pulmonary venous congestion without definite acute cardiopulmonary disease. 2. Mild apparent elevation of the right hemidiaphragm is favored to be secondary to a grossly unchanged small subpulmonic pleural effusion as demonstrated on prior examinations.   Electronically Signed   By: Sandi Mariscal M.D.   On: 10/17/2013 21:36    Discharge Medications     Medication List    STOP taking these medications       losartan 100 MG tablet  Commonly known as:  COZAAR      TAKE these medications       allopurinol 100 MG tablet  Commonly known as:  ZYLOPRIM  Take 1 tablet (100 mg total) by mouth daily.     cholestyramine 4 GM/DOSE powder  Commonly known as:  QUESTRAN  Take 1 packet (4 g total) by mouth 3 (three) times daily with meals.     fluconazole 150 MG tablet  Commonly known as:  DIFLUCAN  2 tabs po daily x 3 days then 1 tab po q week x 4 weeks     fluticasone 50 MCG/ACT nasal spray  Commonly known as:  FLONASE  Place 2 sprays into both nostrils daily.     furosemide 40 MG tablet  Commonly known as:  LASIX  Take 1 tablet (40 mg total) by mouth as needed. Only take for weight 208 pounds or greater     levothyroxine 25 MCG tablet  Commonly known as:  SYNTHROID, LEVOTHROID  TAKE 1 TABLET (25 MCG TOTAL) BY MOUTH DAILY.     mometasone-formoterol 200-5 MCG/ACT Aero  Commonly known as:  DULERA  Inhale 2 puffs into the lungs 2 (two) times daily.     omeprazole 40 MG capsule  Commonly known as:  PRILOSEC  Take 1 capsule (40 mg total) by mouth daily.     OPSUMIT 10 MG Tabs  Generic drug:  Macitentan  Take 10 mg by mouth daily.     potassium chloride 10 MEQ tablet  Commonly known as:  K-DUR  take4 tabs po (40 MeQ) BID     SILENOR  6 MG Tabs  Generic drug:  Doxepin HCl  Take 6 mg by mouth daily.     spironolactone 50 MG tablet  Commonly known as:  ALDACTONE  Take 1 tablet (50 mg total) by mouth daily.     Tadalafil (PAH) 20 MG Tabs  Commonly known as:  ADCIRCA  Take 2 tablets (40 mg total) by mouth daily.     verapamil 180 MG 24 hr capsule  Commonly known as:  VERELAN  Take 1 capsule (180 mg total) by mouth 2 (two) times daily.     zolpidem 10 MG tablet  Commonly known as:  AMBIEN  Take 1 tablet (10 mg total) by mouth  at bedtime as needed for sleep.        Disposition   The patient will be discharged in stable condition to home. Discharge Orders   Future Appointments Provider Department Dept Phone   11/07/2013 9:40 AM [facility] [phone]   11/24/2013 3:30 PM Mosie Lukes, MD [facility]   Future Orders Complete By Expires   ACE Inhibitor / ARB already ordered  As directed    Comments:     Pulmonary hypertension.   Diet - low sodium heart healthy  As directed    Heart Failure patients record your daily weight using the same scale at the same time of day  As directed    Increase activity slowly  As directed      Follow-up Information   Follow up with Glori Bickers, MD On 10/25/2013. (at 9:20 [place])    Specialty:  Cardiology   Contact information:   [place] [address] [phone]         Duration of Discharge Encounter: Greater than 35 minutes   Signed, CLEGG,AMY NP-C 10/19/2013, 8:19 AM   Patient seen and examined with Darrick Grinder, NP. We discussed all aspects of the encounter. I agree with the assessment and plan as stated above.   He is ready for d/c. Syncope likely due to volume depletion in setting of diarrhea. Now improved. Continue to hold diuretics for now unless weight 208 or greater. Need to watch closely as I think he will retain  fluid quickly once diarrhea resolves. Agree with stopping losartan.   Place event monitor.   If diarrhea continues will need outpatient GI workup.   Benay Spice 6:16 PM

## 2013-10-19 NOTE — Progress Notes (Signed)
CARDIAC REHAB PHASE I   PRE:  Rate/Rhythm: 74 SR  BP:  Supine:   Sitting: 134/70  Standing:    SaO2: 99 RA  MODE:  Ambulation: 890 ft   POST:  Rate/Rhythm: 107  BP:  Supine:   Sitting: 120/66  Standing:    SaO2: 92 RA 0835-0920 Pt tolerated ambulation well without c/o. VS stable Pt is DOE, room ais sat after walk 92%. He states that he only wears O2 at night. Completed CHF education with pt. He voices understanding.   Rodney Langton RN 10/19/2013 9:18 AM

## 2013-10-19 NOTE — Progress Notes (Signed)
Advanced Heart Failure Rounding Note   Subjective:    62 yo with history of COPD, portopulmonary hypertension with RV failure, and ETOH cirrhosis presents after syncopal episode. Over the past 2 weeks, he has had loose stool 2-3 times/day every day. He was started on cholestyramine by his PCP. He does not think that he was particularly dehydrated, however. No lightheadedness with standing prior to today. Patient has been stable in terms of exertional symptoms. He tends to get short of breath after walking about 50-100 yards but is able to do most activities without too much trouble. No chest pain. No prior history of syncope. Adcirca was increased to 40 mg daily back in 10/14 with no problems.   Admit with syncope. Creatinine was up to 2.7 from 1.7 baseline. Weight at home trending down to 203-205 pounds.   Yesterday he received 1 liter of NS. Denies SOB/PND/Orthopnea. Weight unchanged. No diarrhea in the last 24 hours.   Creatinine 2.7>2.45>1.79   ECHO EF 55-60% Peak PA pressure 35   Objective:   Weight Range:  Vital Signs:   Temp:  [97.1 F (36.2 C)-97.6 F (36.4 C)] 97.6 F (36.4 C) (12/17 0543) Pulse Rate:  [70-89] 89 (12/17 0543) Resp:  [17-18] 18 (12/17 0543) BP: (98-110)/(62-70) 102/66 mmHg (12/17 0543) SpO2:  [95 %-100 %] 95 % (12/17 0543) Weight:  [200 lb 9.6 oz (90.992 kg)] 200 lb 9.6 oz (90.992 kg) (12/17 0543) Last BM Date: 10/17/13  Weight change: Filed Weights   10/18/13 0126 10/18/13 0136 10/19/13 0543  Weight: 200 lb 6.4 oz (90.901 kg) 200 lb 6.4 oz (90.9 kg) 200 lb 9.6 oz (90.992 kg)    Intake/Output:   Intake/Output Summary (Last 24 hours) at 10/19/13 0745 Last data filed at 10/19/13 0555  Gross per 24 hour  Intake    700 ml  Output   1650 ml  Net   -950 ml     Physical Exam: General: NAD  Neck: JVP flat, no thyromegaly or thyroid nodule.  Lungs: Clear to auscultation bilaterally with normal respiratory effort.  CV: Nondisplaced PMI. Heart regular  S1/S2, wide split S2, 2/6 HSM LLSB. No peripheral edema. No carotid bruit. Normal pedal pulses.  Abdomen: Soft, nontender, no hepatosplenomegaly, no distention.  Skin: Intact without lesions or rashes.  Neurologic: Alert and oriented x 3.  Psych: Normal affect.  Extremities: No clubbing or cyanosis.  HEENT: Normal.    Telemetry: SR 90s   Labs: Basic Metabolic Panel:  Recent Labs Lab 10/17/13 2102 10/18/13 0205 10/19/13 0535  NA 138  --  135  K 3.7  --  4.1  CL 108  --  107  CO2  --   --  17*  GLUCOSE 106*  --  105*  BUN 50*  --  42*  CREATININE 2.70* 2.45* 1.79*  CALCIUM  --   --  9.1    Liver Function Tests: No results found for this basename: AST, ALT, ALKPHOS, BILITOT, PROT, ALBUMIN,  in the last 168 hours No results found for this basename: LIPASE, AMYLASE,  in the last 168 hours No results found for this basename: AMMONIA,  in the last 168 hours  CBC:  Recent Labs Lab 10/17/13 2102 10/18/13 0205  WBC  --  3.5*  HGB 12.6* 10.9*  HCT 37.0* 32.0*  MCV  --  100.9*  PLT  --  67*    Cardiac Enzymes:  Recent Labs Lab 10/18/13 0205  TROPONINI <0.30    BNP: BNP (last 3  results)  Recent Labs  08/29/13 0936 10/18/13 0205  PROBNP 1262.0* 619.8*     Other results:  :   Imaging: Dg Thoracic Spine W/swimmers  10/18/2013   CLINICAL DATA:  Low back pain, no injury, history cirrhosis, hypertension, renal insufficiency  EXAM: THORACIC SPINE - 2 VIEW + SWIMMERS  COMPARISON:  Chest radiographs 01/11/2013  FINDINGS: Twelve pairs of ribs.  Mild osseous demineralization.  Vertebral body and disc space heights maintained.  No acute fracture, subluxation or bone destruction.  Visualized posterior ribs unremarkable.  IMPRESSION: No acute osseous abnormalities.   Electronically Signed   By: Lavonia Dana M.D.   On: 10/18/2013 00:18   Dg Lumbar Spine Complete  10/18/2013   CLINICAL DATA:  Low back pain.  No known injury  EXAM: LUMBAR SPINE - COMPLETE 4+ VIEW   COMPARISON:  None.  FINDINGS: There is no evidence of lumbar spine fracture. Alignment is normal. Intervertebral disc spaces are maintained. Abdominal aortic atherosclerosis.  IMPRESSION: Negative.   Electronically Signed   By: Jorje Guild M.D.   On: 10/18/2013 00:27   Dg Chest Portable 1 View  10/17/2013   CLINICAL DATA:  Loss of consciousness, chest and back pain, weakness  EXAM: PORTABLE CHEST - 1 VIEW  COMPARISON:  01/11/2013; 12/29/2012; 11/26/2012; chest CT -11/30/2012  FINDINGS: Grossly unchanged enlarged cardiac silhouette and mediastinal contours. Mild pulmonary venous congestion without frank evidence of edema. There is persistent mild elevation of the right hemidiaphragm. A subpulmonic right-sided pleural effusion is not excluded on the basis of this examination. Grossly unchanged bilateral infrahilar heterogeneous opacities, likely atelectasis. No new focal airspace opacities. No pleural effusion. Unchanged bones.  IMPRESSION: 1. Cardiomegaly and pulmonary venous congestion without definite acute cardiopulmonary disease. 2. Mild apparent elevation of the right hemidiaphragm is favored to be secondary to a grossly unchanged small subpulmonic pleural effusion as demonstrated on prior examinations.   Electronically Signed   By: Sandi Mariscal M.D.   On: 10/17/2013 21:36     Medications:     Scheduled Medications: . allopurinol  100 mg Oral Daily  . cholestyramine  4 g Oral TID  . fluticasone  2 spray Each Nare Daily  . heparin  5,000 Units Subcutaneous Q8H  . levothyroxine  25 mcg Oral QAC breakfast  . Macitentan  10 mg Oral Daily  . mometasone-formoterol  2 puff Inhalation BID  . pantoprazole  40 mg Oral Daily  . sodium chloride  3 mL Intravenous Q12H  . Tadalafil (PAH)  40 mg Oral Daily  . verapamil  180 mg Oral BID    Infusions:    PRN Medications: sodium chloride, acetaminophen, morphine injection, ondansetron (ZOFRAN) IV, sodium chloride, zolpidem   Assessment:  1.  Syncope 2. PAH - suspected portopulmonary HTN  3. A/C renal failure creatinine baseline 1.7  4. Back Pain- resolved 5. Diarrhea- cd iff negative 6. ETOH abuse -    Plan/Discussion:   Volume status remains low. Plan to restart lasix 40 mg if his weight is 208 pounds or greater.  Continue macitentan and adcirca 40 mg daily. Place event event monitor.   Pharmacy also provided instruction questran and not taking with medications but can take 1 hour before meds or 4 hours after meds.   Unsure of the cause of back pain. Lumbar Xray ok. UA negative  Cdiff negative.   Length of Stay: 2  CLEGG,AMY NP-C 10/19/2013, 7:45 AM  Advanced Heart Failure Team Pager [phone] (M-F; 7a - 4p)  Please contact [facility] for night-coverage after hours (4p -7a ) and weekends on [email or web address]  Patient seen and examined with Darrick Grinder, NP. We discussed all aspects of the encounter. I agree with the assessment and plan as stated above.   Syncope related to volume depletion due to diarrhea/irritable bowel syndrome in the setting of [place]. Weight down 10 pounds. Agree with holding diuretics and losartan. Hydrate gently. Check echo. No arrhythmias on tele. Renal function getting better.   Will need outpatient event monitor on d/c.   Suspect he has IBS. C. Diff negative. Check [facility] Parasites. May need GI work-up.   CLEGG,AMY,MD 7:45 AM   Patient seen and examined with Darrick Grinder, NP. We discussed all aspects of the encounter. I agree with the assessment and plan as stated above.   No further syncope. No events on tele. Echo reviewed. He has significant RV failure but PA pressures only mildly elevated. Suspect syncope due to volume depletion. Will stop losartan and hold lasix unless weight ~ 208 or greater. If diarrhea continue will need f/u with GI.   Can go home today with outpatient monitor x 48 hours.   Daniel Bensimhon,MD 8:15 AM

## 2013-10-20 ENCOUNTER — Telehealth (HOSPITAL_COMMUNITY): Payer: Self-pay | Admitting: *Deleted

## 2013-10-20 DIAGNOSIS — R55 Syncope and collapse: Secondary | ICD-10-CM

## 2013-10-20 NOTE — Telephone Encounter (Signed)
Pt in hospital for syncopal episode per Dr Haroldine Laws pt needs event monitor, order placed and sent to [facility] to sch

## 2013-10-25 ENCOUNTER — Telehealth (HOSPITAL_COMMUNITY): Payer: Self-pay | Admitting: Adult Health

## 2013-10-25 ENCOUNTER — Ambulatory Visit (HOSPITAL_COMMUNITY)
Admission: RE | Admit: 2013-10-25 | Discharge: 2013-10-25 | Disposition: A | Discharge status: Home / Self Care | Source: Ambulatory Visit | Attending: Internal Medicine | Admitting: Internal Medicine

## 2013-10-25 VITALS — BP 118/64 | HR 102 | Wt 205.5 lb

## 2013-10-25 DIAGNOSIS — R197 Diarrhea, unspecified: Secondary | ICD-10-CM

## 2013-10-25 DIAGNOSIS — I2721 Secondary pulmonary arterial hypertension: Secondary | ICD-10-CM

## 2013-10-25 DIAGNOSIS — N189 Chronic kidney disease, unspecified: Secondary | ICD-10-CM | POA: Insufficient documentation

## 2013-10-25 DIAGNOSIS — K7030 Alcoholic cirrhosis of liver without ascites: Secondary | ICD-10-CM | POA: Insufficient documentation

## 2013-10-25 DIAGNOSIS — R55 Syncope and collapse: Secondary | ICD-10-CM

## 2013-10-25 DIAGNOSIS — I2789 Other specified pulmonary heart diseases: Secondary | ICD-10-CM

## 2013-10-25 LAB — BASIC METABOLIC PANEL
BUN: 29 mg/dL — ABNORMAL HIGH (ref 6–23)
Calcium: 9.4 mg/dL (ref 8.4–10.5)
Chloride: 109 mEq/L (ref 96–112)
Creatinine, Ser: 1.49 mg/dL — ABNORMAL HIGH (ref 0.50–1.35)
GFR calc Af Amer: 56 mL/min — ABNORMAL LOW (ref >90)
GFR calc non Af Amer: 48 mL/min — ABNORMAL LOW (ref >90)

## 2013-10-25 NOTE — Patient Instructions (Signed)
We will contact you in 2 months to schedule your next appointment.  

## 2013-10-25 NOTE — Telephone Encounter (Signed)
Left message. Lab work stable.   CLEGG,AMY NP-C 12:45 PM

## 2013-10-25 NOTE — Addendum Note (Signed)
Encounter addended by: Scarlette Calico, RN on: 10/25/2013 10:32 AM<BR>     Documentation filed: Patient Instructions Section

## 2013-10-25 NOTE — Progress Notes (Signed)
Patient ID: Darren Mueller, male   DOB: 09-Feb-1951, 62 y.o.   MRN: [id number]  GI : Dr Ardis Hughs PCP: DrBlyth  Subjective:    62 yo with history of COPD, portopulmonary hypertension with RV failure, and ETOH cirrhosis presents after syncopal episode. Over the past 2 weeks, he has had loose stool 2-3 times/day every day. He was started on cholestyramine by his PCP.    No prior history of syncope. Adcirca was increased to 40 mg daily back in 10/14 with no problems.   Admitted with syncope last week in the setting of persistent diarrhea. C.diff and O&P negative.  Creatinine was up to 2.7 from 1.7 baseline. He was hydrated. Losartan and lasix stopped. Told to hold lasix unless weight 208 or greater. Weight at home trending down to 203-205 pounds. Continues with post-prandial diarrhea. Denies recurrent syncope/PND/Orthopnea/palpitations. No fevers, ab pain, blood in stool. Does admit to dyspnea walking to back of grocery store. Compliant with medications.   10/19/13 Creatinine 1.79 K 4.1   ECHO EF 55-60% Peak PA pressure 35. Severe RV dysfunction   Objective:   Weight Range:  Vital Signs:   Pulse Rate:  [102] 102 (12/23 0932) BP: (118)/(64) 118/64 mmHg (12/23 0932) SpO2:  [96 %] 96 % (12/23 0932) Weight:  [205 lb 8 oz (93.214 kg)] 205 lb 8 oz (93.214 kg) (12/23 0932)    Weight change: Filed Weights   10/25/13 0932  Weight: 205 lb 8 oz (93.214 kg)    Intake/Output:  No intake or output data in the 24 hours ending 10/25/13 1002   Physical Exam: General: NAD  Neck: JVP flat, no thyromegaly or thyroid nodule.  Lungs: Clear to auscultation bilaterally with normal respiratory effort.  CV: Nondisplaced PMI. Heart regular S1/S2, wide split S2, 2/6 HSM LLSB. No peripheral edema. No carotid bruit. Normal pedal pulses.  Abdomen: Soft, nontender, no hepatosplenomegaly, no distention.  Skin: Intact without lesions or rashes.  Neurologic: Alert and oriented x 3.  Psych: Normal affect.   Extremities: No clubbing or cyanosis.  HEENT: Normal.   Labs: Basic Metabolic Panel:  Recent Labs Lab 10/19/13 0535  NA 135  K 4.1  CL 107  CO2 17*  GLUCOSE 105*  BUN 42*  CREATININE 1.79*  CALCIUM 9.1    Liver Function Tests: No results found for this basename: AST, ALT, ALKPHOS, BILITOT, PROT, ALBUMIN,  in the last 168 hours No results found for this basename: LIPASE, AMYLASE,  in the last 168 hours No results found for this basename: AMMONIA,  in the last 168 hours  CBC: No results found for this basename: WBC, NEUTROABS, HGB, HCT, MCV, PLT,  in the last 168 hours  Cardiac Enzymes: No results found for this basename: CKTOTAL, CKMB, CKMBINDEX, TROPONINI,  in the last 168 hours  BNP: BNP (last 3 results)  Recent Labs  08/29/13 0936 10/18/13 0205  PROBNP 1262.0* 619.8*     Other results:  :   Imaging: No results found.   Medications:      Medication List    ASK your doctor about these medications       allopurinol 100 MG tablet  Commonly known as:  ZYLOPRIM  Take 1 tablet (100 mg total) by mouth daily.     cholestyramine 4 GM/DOSE powder  Commonly known as:  QUESTRAN  Take 1 packet (4 g total) by mouth 3 (three) times daily with meals.     fluconazole 150 MG tablet  Commonly known as:  DIFLUCAN  2  tabs po daily x 3 days then 1 tab po q week x 4 weeks     fluticasone 50 MCG/ACT nasal spray  Commonly known as:  FLONASE  Place 2 sprays into both nostrils daily.     furosemide 40 MG tablet  Commonly known as:  LASIX  Take 1 tablet (40 mg total) by mouth as needed. Only take for weight 208 pounds or greater     levothyroxine 25 MCG tablet  Commonly known as:  SYNTHROID, LEVOTHROID  TAKE 1 TABLET (25 MCG TOTAL) BY MOUTH DAILY.     mometasone-formoterol 200-5 MCG/ACT Aero  Commonly known as:  DULERA  Inhale 2 puffs into the lungs 2 (two) times daily.     omeprazole 40 MG capsule  Commonly known as:  PRILOSEC  Take 1 capsule (40 mg  total) by mouth daily.     OPSUMIT 10 MG Tabs  Generic drug:  Macitentan  Take 10 mg by mouth daily.     potassium chloride 10 MEQ tablet  Commonly known as:  K-DUR  take4 tabs po (40 MeQ) BID     SILENOR 6 MG Tabs  Generic drug:  Doxepin HCl  Take 6 mg by mouth daily.     spironolactone 50 MG tablet  Commonly known as:  ALDACTONE  Take 1 tablet (50 mg total) by mouth daily.     Tadalafil (PAH) 20 MG Tabs  Commonly known as:  ADCIRCA  Take 2 tablets (40 mg total) by mouth daily.     verapamil 180 MG 24 hr capsule  Commonly known as:  VERELAN  Take 1 capsule (180 mg total) by mouth 2 (two) times daily.     zolpidem 10 MG tablet  Commonly known as:  AMBIEN  Take 1 tablet (10 mg total) by mouth at bedtime as needed for sleep.        Assessment/Plan   1.. PAH - suspected portopulmonary HTN  Volume status remains low. Plan to restart lasix 40 mg if his weight is 208 pounds or greater. Continue macitentan and adcirca 40 mg daily. Place event event monitor.  2. Syncope- place event monitor tomorrow.  3. Chronic Renal Failure - reatinine baseline 1.7  4. Diarrhea- c.diff negative. Follow up in with GI.  5. ETOH cirrhosis -   Length of Stay: 0  CLEGG,AMY NP-C 10/25/2013, 10:02 AM  Patient seen and examined with Darrick Grinder, NP. We discussed all aspects of the encounter. I agree with the assessment and plan as stated above.   Much improved. Suspect syncope was due to volume depletion in the setting of diarrhea and right heart failure. He is improved off lasix but still having diarrhea. I am suspicious for IBS but I think sprue could also be an issue. I have sent a note to his gastroenterologist, Dr. Ardis Hughs and hopefully he can be seen soon to further the w/u started by his PCP.   From a [place] standpoint, doing well on combination therapy. PA pressures down on echo. NYHA II-III symptoms. Will continue. Reminded him to abstain as much as possible from ETOH given portopulmonary HTN.    Will place 30-day event monitor to exclude arrhythmogenic causes of syncope.   Kersti Scavone,MD 10:28 AM

## 2013-10-26 ENCOUNTER — Encounter: Payer: Self-pay | Admitting: *Deleted

## 2013-10-26 ENCOUNTER — Other Ambulatory Visit: Payer: Self-pay | Admitting: Internal Medicine

## 2013-10-26 ENCOUNTER — Encounter (INDEPENDENT_AMBULATORY_CARE_PROVIDER_SITE_OTHER)

## 2013-10-26 DIAGNOSIS — R55 Syncope and collapse: Secondary | ICD-10-CM

## 2013-10-26 NOTE — Telephone Encounter (Signed)
Rx request to pharmacy/[facility]  

## 2013-10-26 NOTE — Progress Notes (Signed)
Patient ID: Darren Mueller, male   DOB: 1950-11-20, 62 y.o.   MRN: [id number] E-Cardio verite 30 day cardiac event monitor applied to patient.

## 2013-10-31 ENCOUNTER — Telehealth: Payer: Self-pay

## 2013-10-31 NOTE — Telephone Encounter (Signed)
Left message on machine to call back  

## 2013-10-31 NOTE — Telephone Encounter (Signed)
Pt has been scheduled with Amy for 11/02/13 he is aware

## 2013-10-31 NOTE — Telephone Encounter (Signed)
Message copied by Barron Alvine on Mon Oct 31, 2013  8:54 AM ------      Message from: Owens Loffler P      Created: Wed Oct 26, 2013  6:46 AM       Fransisca Kaufmann out this week but I'll get him in with extender this week or with myself sometime next week.       Thanks                  Maynor Mwangi,      See above.  Thanks            DJ            ----- Message -----         From: Jolaine Artist, MD         Sent: 10/25/2013  10:16 AM           To: Milus Banister, MD            Linna Hoff,            [place].             Mr. Savant was recently admitted for syncope, hypotension and renal failure in the setting of volume depletion. For the past month he has been having post-prandial diarrhea which is quite severe at times. In the hospital, stool was negative for O&P and C. Diff. We stopped all his diuretics despite his severe PAH and RH failure.            I am seeing him back in clinic today and he continues with diarrhea. Is there a chance you can see him soon to help [place] figure this out?            Thanks -dan       ------

## 2013-11-02 ENCOUNTER — Encounter: Payer: Self-pay | Admitting: Physician Assistant

## 2013-11-02 ENCOUNTER — Ambulatory Visit (INDEPENDENT_AMBULATORY_CARE_PROVIDER_SITE_OTHER): Admitting: Physician Assistant

## 2013-11-02 VITALS — BP 124/60 | HR 77 | Ht 73.0 in | Wt 210.0 lb

## 2013-11-02 DIAGNOSIS — R197 Diarrhea, unspecified: Secondary | ICD-10-CM

## 2013-11-02 MED ORDER — SACCHAROMYCES BOULARDII 250 MG PO CAPS: 250.0000 mg | ORAL_CAPSULE | Freq: Two times a day (BID) | ORAL | Status: DC

## 2013-11-02 MED ORDER — METRONIDAZOLE 250 MG PO TABS: ORAL_TABLET | ORAL | Status: DC

## 2013-11-02 NOTE — Patient Instructions (Signed)
Please go to the basement level for the stool test. We sent a prescription for Flagyl 250 mg to [facility]. We also one for Florastor probiotic.  We made you an appointment with Nicoletta Ba PA-C for 11-21-2013 at 10:30 am .

## 2013-11-02 NOTE — Progress Notes (Signed)
Subjective:    Patient ID: Darren Mueller, male    DOB: June 17, 1951, 62 y.o.   MRN: [id number]  HPI  Lora is a pleasant 62 year old male known to Dr. Ardis Hughs. He has history of COPD, portal pulmonary hypertension with right ventricular failure and alcohol-induced cirrhosis. He also has history of varices seen on upper endoscopy 2012 and adenomatous polyps on colonoscopy 12/ 2010. Patient had a recent hospital admission earlier in December after a syncopal episode. This was in the setting of persistent diarrhea. Patient had had stool for C. difficile toxin culture and O&P done in November of 2014 and all of these were negative. Patient relates that he has had the diarrhea for least 2-1/2 months and states most days this is urgent though he has not had any incontinence. He occasionally has had a semi-formed perform stool. Most days he is having 4-5 bowel movements per day. Stools are nonbloody somewhat malodorous. He denies any abdominal pain or cramping has not had any nausea or vomiting. No documented fevers. His appetite has been fair. We reviewed his meds and he has not been started on any new oral medications other than Questran which has been discontinued. He did take a course of amoxicillin in October of for an abscessed tooth and says the diarrhea started after that.    Review of Systems  Constitutional: Positive for fatigue.  HENT: Negative.   Eyes: Negative.   Respiratory: Negative.   Cardiovascular: Negative.   Gastrointestinal: Positive for diarrhea.  Endocrine: Negative.   Genitourinary: Negative.   Musculoskeletal: Negative.   Allergic/Immunologic: Negative.   Neurological: Negative.   Hematological: Negative.   Psychiatric/Behavioral: Negative.    Outpatient Prescriptions Prior to Visit  Medication Sig Dispense Refill  . allopurinol (ZYLOPRIM) 100 MG tablet Take 1 tablet (100 mg total) by mouth daily.  90 tablet  1  . Doxepin HCl (SILENOR) 6 MG TABS Take 6 mg by mouth  daily.       . fluticasone (FLONASE) 50 MCG/ACT nasal spray Place 2 sprays into both nostrils daily.  16 g  6  . furosemide (LASIX) 40 MG tablet Take 1 tablet (40 mg total) by mouth as needed. Only take for weight 208 pounds or greater  30 tablet  6  . levothyroxine (SYNTHROID, LEVOTHROID) 25 MCG tablet TAKE 1 TABLET (25 MCG TOTAL) BY MOUTH DAILY.  30 tablet  3  . Macitentan (OPSUMIT) 10 MG TABS Take 10 mg by mouth daily.       . mometasone-formoterol (DULERA) 200-5 MCG/ACT AERO Inhale 2 puffs into the lungs 2 (two) times daily.  13 g  6  . omeprazole (PRILOSEC) 40 MG capsule TAKE 1 CAPSULE (40 MG TOTAL) BY MOUTH DAILY.  90 capsule  0  . potassium chloride (K-DUR) 10 MEQ tablet take4 tabs po (40 MeQ) BID  120 tablet  6  . spironolactone (ALDACTONE) 50 MG tablet Take 1 tablet (50 mg total) by mouth daily.  30 tablet  11  . Tadalafil, PAH, (ADCIRCA) 20 MG TABS Take 2 tablets (40 mg total) by mouth daily.  180 tablet  3  . verapamil (VERELAN) 180 MG 24 hr capsule Take 1 capsule (180 mg total) by mouth 2 (two) times daily.  180 capsule  0  . zolpidem (AMBIEN) 10 MG tablet Take 1 tablet (10 mg total) by mouth at bedtime as needed for sleep.  30 tablet  2  . cholestyramine (QUESTRAN) 4 GM/DOSE powder Take 1 packet (4 g total) by mouth 3 (  three) times daily with meals.  378 g  3  . fluconazole (DIFLUCAN) 150 MG tablet 2 tabs po daily x 3 days then 1 tab po q week x 4 weeks  10 tablet  0   No facility-administered medications prior to visit.   Allergies  Allergen Reactions  . Aspirin Other (See Comments)    hypertension   Patient Active Problem List   Diagnosis Date Noted  . Syncope 10/18/2013  . Diarrhea 09/04/2013  . Chronic renal insufficiency, stage III (moderate) 07/18/2013  . Right heart failure due to pulmonary hypertension 06/06/2013  . Unspecified hypothyroidism 01/25/2013  . PAH (pulmonary arterial hypertension) with portal hypertension 01/21/2013  . Dyspnea 01/12/2013  . Allergic  rhinitis 12/21/2012  . Secondary pulmonary hypertension 12/09/2012  . Hepatic cirrhosis 11/26/2012  . Obstructive chronic bronchitis without exacerbation 11/01/2012  . GERD (gastroesophageal reflux disease) 09/05/2011  . Hypokalemia 08/29/2011  . Macrocytosis 08/29/2011  . Colon polyps 03/26/2011  . Insomnia 03/26/2011  . Hypertension 03/22/2011  . Alcohol dependence 03/22/2011  . Seizure disorder 03/22/2011   History  Substance Use Topics  . Smoking status: Former Smoker -- 2.00 packs/day for 5 years    Types: Cigarettes    Quit date: 09/22/1979  . Smokeless tobacco: Never Used  . Alcohol Use: Yes     Comment: 3-4 cans of beers and 4-5 drinks of vodka - has not drunk any alcohol in the last few weeks   family history includes Heart attack in his mother; Heart disease in his mother; Hypertension in his mother; Prostate cancer in his father. There is no history of Colon cancer.     Objective:   Physical Exam  well-developed older male in no acute distress, pleasant blood pressure 124/60 pulse 77 height 6 foot 1 weight 210. HEENT ;nontraumatic normocephalic EOMI PERRLA sclera anicteric, Supple; no JVD, Cardiovascular ;egular rate and rhythm with S1-S2 he does have a murmur, Pulm; clear bilaterally, Abdomen; soft nontender nondistended bowel sounds are active there is no palpable mass or hepatosplenomegaly, Rectal not done, Extremities; no clubbing cyanosis or edema skin warm and dry, Psych ;mood and affect normal and appropriate        Assessment & Plan:  #2 62 year old male with persistent diarrhea x2 months. Previous stool cultures negative November 2014 though he did have a lactoferrin which was positive I suspect she may have C. difficile colitis despite the negative toxin assay in November  #2 EtOH-induced cirrhosis with varices documented on EGD 2012 #3 history of adenomatous colon polyps last colonoscopy December 2010 #4 right heart failure secondary pulmonary  hypertension #4 chronic renal insufficiency #5 chronic GERD  Plan; check stool for C. difficile by PCR Start Flagyl 250 mg by mouth 4 times daily x14 days-patient was cautioned not to use alcohol while on Flagyl as this may cause nausea and vomiting.Marland Kitchen He states that this will not be a problem. Ivar Bury twice daily x3 weeks Avoid antidiarrheals and remain off Questran  Followup with Dr. Ardis Hughs or myself in 3 week Will discuss with Dr. Ardis Hughs- indication for followup EGD in the setting of small to medium varices seen on prior EGD 2012

## 2013-11-04 ENCOUNTER — Other Ambulatory Visit

## 2013-11-04 DIAGNOSIS — R197 Diarrhea, unspecified: Secondary | ICD-10-CM

## 2013-11-04 NOTE — Progress Notes (Signed)
I agree.  Would consider repeat EGD pending his clinical course.  He has pulm HTN and I've put off polyp surveillance as well, to revisit this question in mid 2015.  Would like rov with me in 3-4 weeks.

## 2013-11-07 ENCOUNTER — Telehealth: Payer: Self-pay

## 2013-11-07 ENCOUNTER — Encounter (HOSPITAL_COMMUNITY)

## 2013-11-07 LAB — CLOSTRIDIUM DIFFICILE BY PCR: CDIFFPCR: NOT DETECTED

## 2013-11-07 NOTE — Telephone Encounter (Signed)
Pt has been sent a message regarding appt changes per Amy

## 2013-11-07 NOTE — Telephone Encounter (Signed)
Darren Mueller pt has a follow up with you on 11/21/13.  Should he keep this or change to Dr Ardis Hughs?

## 2013-11-07 NOTE — Telephone Encounter (Signed)
I would like him to see Dr. Johny Shears

## 2013-11-07 NOTE — Telephone Encounter (Signed)
Message copied by Barron Alvine on Mon Nov 07, 2013  8:18 AM ------      Message from: [place], [place] S      Created: Fri Nov 04, 2013  2:03 PM       Ramses Klecka, please be sure pt gets a follow up appt with DR. Ardis Hughs in 3-4 weeks, thanks, Amy      ----- Message -----         From: Milus Banister, MD         Sent: 11/04/2013   7:14 AM           To: Alfredia Ferguson, PA-C                        ----- Message -----         From: Alfredia Ferguson, PA-C         Sent: 11/02/2013   1:17 PM           To: Milus Banister, MD                   ------

## 2013-11-21 ENCOUNTER — Ambulatory Visit: Admitting: Physician Assistant

## 2013-11-24 ENCOUNTER — Ambulatory Visit (INDEPENDENT_AMBULATORY_CARE_PROVIDER_SITE_OTHER): Admitting: Family Medicine

## 2013-11-24 ENCOUNTER — Encounter: Payer: Self-pay | Admitting: Family Medicine

## 2013-11-24 ENCOUNTER — Telehealth: Payer: Self-pay

## 2013-11-24 VITALS — BP 120/78 | HR 83 | Temp 97.8°F | Ht 73.0 in | Wt 213.1 lb

## 2013-11-24 DIAGNOSIS — N183 Chronic kidney disease, stage 3 unspecified: Secondary | ICD-10-CM

## 2013-11-24 DIAGNOSIS — E876 Hypokalemia: Secondary | ICD-10-CM

## 2013-11-24 DIAGNOSIS — I10 Essential (primary) hypertension: Secondary | ICD-10-CM

## 2013-11-24 DIAGNOSIS — G4700 Insomnia, unspecified: Secondary | ICD-10-CM

## 2013-11-24 DIAGNOSIS — R197 Diarrhea, unspecified: Secondary | ICD-10-CM

## 2013-11-24 DIAGNOSIS — H9202 Otalgia, left ear: Secondary | ICD-10-CM

## 2013-11-24 DIAGNOSIS — H9209 Otalgia, unspecified ear: Secondary | ICD-10-CM

## 2013-11-24 MED ORDER — ANTIPYRINE-BENZOCAINE 5.4-1.4 % OT SOLN: 2.0000 [drp] | OTIC | Status: DC | PRN

## 2013-11-24 MED ORDER — COLESEVELAM HCL 3.75 G PO PACK: 1.0000 | PACK | Freq: Every day | ORAL | Status: DC

## 2013-11-24 NOTE — Telephone Encounter (Signed)
I called the pharmacy and spoke to [facility] and she stated that Metformin is not good to take with any other medication because it causes "binding". Brayton Layman stated pt could take the Metformin, Opsumit and Tadalafil as long as he spaces it out from one to another. Brayton Layman is going to inform pt when he comes to the pharmacy

## 2013-11-24 NOTE — Progress Notes (Signed)
Pre visit review using our clinic review tool, if applicable. No additional management support is needed unless otherwise documented below in the visit note.

## 2013-11-24 NOTE — Progress Notes (Signed)
Subjective:     Patient ID: Darren Mueller, male   DOB: 09-Mar-1951, 63 y.o.   MRN: [id number]  CC:  2 mo f/u from sinus infection & diarrhea.  HPI 1.  Pt presents for f/u of non-resolving diarrhea. Pt was in office 09/26/2014 for diarrhea CC. He states he still has the diarrhea although it may be slightly less frequent. Pt denies abdominal pain or fevers. He saw Gastroenterology and was treated with Flagyl 272m QID x 14 days for C.diff. Lab results on 11/08/13 were negative for Cdiff. Pt  return for f/u with Dr. JArdis Hughsin 1-2 weeks.  2. Pt has had left earache for a few months. Denies changes in hearing, feelings of fullness or pressure. Pt was given Fluticasone in November for relief of nasal congestion associated with sinus/allergy problems. Pt has stopped taking Fluticasone. Denies headaches or tingling sensations on face. States the pain is intermittent and short lasting (1 minute) but very sharp 6 on 1-10 scale. Reports most of these pains are at night.   3. Pt was in the hospital in mid-December for syncopal episode. Pt reports that he was told there was a drug interaction between Questran, Opsumit, & Tadalafil that lkely contributed to the syncope. At gastroenterology, the QLucrezia Starchwas discontinued.   Review of Systems  Constitutional: Negative for fever and chills.  HENT: Positive for ear pain. Negative for congestion and hearing loss.   Cardiovascular: Negative for chest pain and palpitations.  Gastrointestinal: Positive for diarrhea. Negative for nausea, vomiting, abdominal pain and constipation.  Neurological: Negative for dizziness, seizures, light-headedness, numbness and headaches.   Current outpatient prescriptions:allopurinol (ZYLOPRIM) 100 MG tablet, Take 1 tablet (100 mg total) by mouth daily., Disp: 90 tablet, Rfl: 1;  Doxepin HCl (SILENOR) 6 MG TABS, Take 6 mg by mouth daily. , Disp: , Rfl: ;  fluticasone (FLONASE) 50 MCG/ACT nasal spray, Place 2 sprays into both nostrils daily.,  Disp: 16 g, Rfl: 6 furosemide (LASIX) 40 MG tablet, Take 1 tablet (40 mg total) by mouth as needed. Only take for weight 208 pounds or greater, Disp: 30 tablet, Rfl: 6;  levothyroxine (SYNTHROID, LEVOTHROID) 25 MCG tablet, TAKE 1 TABLET (25 MCG TOTAL) BY MOUTH DAILY., Disp: 30 tablet, Rfl: 3;  Macitentan (OPSUMIT) 10 MG TABS, Take 10 mg by mouth daily. , Disp: , Rfl:  mometasone-formoterol (DULERA) 200-5 MCG/ACT AERO, Inhale 2 puffs into the lungs 2 (two) times daily., Disp: 13 g, Rfl: 6;  omeprazole (PRILOSEC) 40 MG capsule, TAKE 1 CAPSULE (40 MG TOTAL) BY MOUTH DAILY., Disp: 90 capsule, Rfl: 0;  potassium chloride (K-DUR) 10 MEQ tablet, take4 tabs po (40 MeQ) BID, Disp: 120 tablet, Rfl: 6 saccharomyces boulardii (FLORASTOR) 250 MG capsule, Take 1 capsule (250 mg total) by mouth 2 (two) times daily., Disp: 60 capsule, Rfl: 1;  spironolactone (ALDACTONE) 50 MG tablet, Take 1 tablet (50 mg total) by mouth daily., Disp: 30 tablet, Rfl: 11;  Tadalafil, PAH, (ADCIRCA) 20 MG TABS, Take 2 tablets (40 mg total) by mouth daily., Disp: 180 tablet, Rfl: 3 verapamil (VERELAN) 180 MG 24 hr capsule, Take 1 capsule (180 mg total) by mouth 2 (two) times daily., Disp: 180 capsule, Rfl: 0;  zolpidem (AMBIEN) 10 MG tablet, Take 1 tablet (10 mg total) by mouth at bedtime as needed for sleep., Disp: 30 tablet, Rfl: 2  Allergies  Allergen Reactions  . Aspirin Other (See Comments)    hypertension   Past Medical History  Diagnosis Date  . Hypertension   .  Seizures   . Hx of colonic polyps   . GERD (gastroesophageal reflux disease)   . Gout   . Alcoholic cirrhosis of liver with ascites 2014  . Unspecified hypothyroidism 01/25/2013  . Unspecified pleural effusion   . Other chronic pulmonary heart diseases   . Hypopotassemia   . Renal insufficiency 07/18/2013  . Diarrhea 09/04/2013        Objective:   Physical Exam  Constitutional: He is oriented to person, place, and time. He appears well-developed and  well-nourished. No distress.  HENT:  Head: Normocephalic and atraumatic.  Right Ear: Hearing, tympanic membrane, external ear and ear canal normal.  Left Ear: Hearing, external ear and ear canal normal. Tympanic membrane is retracted.  Mouth/Throat: Oropharynx is clear and moist.  Eyes: Lids are normal. Right eye exhibits no discharge. Left eye exhibits no discharge.  Cardiovascular: Normal rate, regular rhythm and normal heart sounds.   Pulmonary/Chest: Effort normal. No respiratory distress. He has wheezes in the right lower field and the left lower field.  Mild expiratory wheezes bilaterally in bases.   Lymphadenopathy:       Head (right side): No submental, no submandibular, no tonsillar, no preauricular and no posterior auricular adenopathy present.       Head (left side): No submental, no submandibular, no tonsillar, no preauricular and no posterior auricular adenopathy present.    He has no cervical adenopathy.  Neurological: He is alert and oriented to person, place, and time.  Skin: Skin is warm and dry.  Psychiatric: He has a normal mood and affect. His behavior is normal. Judgment and thought content normal.    Filed Vitals:   11/24/13 1518  BP: 120/78  Pulse: 83  Temp: 97.8 F (36.6 C)       Assessment:      Impression:  1. Diarrhea, unresolved since November 2014.  2. Left eustachian tube dysfunction.     Plan:      1. Welcohl samples given to pt. Called pharmacist to check for drug interactions with other medicines (Opsumit, Tadalafil). 2. Auralgan  Otic for ear pain h.s. Pt will consider ENT referral if symptoms continue to be bothersome or worsen. 3. Pt was educated about staying hydrated and spacing medications out to avoid another syncopal episode.   01.22.2014.       Patient interviewed, examined and seen with student, agree with documentation  Hypertension Well controlled, no changes  Diarrhea Persistent, several episodes daily no bloody stool.  Will consider a course of Welchol powder to see if that helps. May use Imodium prn. So far testing with [place] and GI is negative. Add a probiotic and a fiber supplement  Hypokalemia resolved  Chronic renal insufficiency, stage III (moderate) stable  Otalgia Try Auralgan prn. Report worsening symptoms  Insomnia Tolerating Silenor, may use prn

## 2013-11-24 NOTE — Patient Instructions (Signed)
Diarrhea Diarrhea is frequent loose and watery bowel movements. It can cause you to feel weak and dehydrated. Dehydration can cause you to become tired and thirsty, have a dry mouth, and have decreased urination that often is dark yellow. Diarrhea is a sign of another problem, most often an infection that will not last long. In most cases, diarrhea typically lasts 2 3 days. However, it can last longer if it is a sign of something more serious. It is important to treat your diarrhea as directed by your caregive to lessen or prevent future episodes of diarrhea. CAUSES  Some common causes include:  Gastrointestinal infections caused by viruses, bacteria, or parasites.  Food poisoning or food allergies.  Certain medicines, such as antibiotics, chemotherapy, and laxatives.  Artificial sweeteners and fructose.  Digestive disorders. HOME CARE INSTRUCTIONS  Ensure adequate fluid intake (hydration): have 1 cup (8 oz) of fluid for each diarrhea episode. Avoid fluids that contain simple sugars or sports drinks, fruit juices, whole milk products, and sodas. Your urine should be clear or pale yellow if you are drinking enough fluids. Hydrate with an oral rehydration solution that you can purchase at pharmacies, retail stores, and online. You can prepare an oral rehydration solution at home by mixing the following ingredients together:    tsp table salt.   tsp baking soda.   tsp salt substitute containing potassium chloride.  1  tablespoons sugar.  1 L (34 oz) of water.  Certain foods and beverages may increase the speed at which food moves through the gastrointestinal (GI) tract. These foods and beverages should be avoided and include:  Caffeinated and alcoholic beverages.  High-fiber foods, such as raw fruits and vegetables, nuts, seeds, and whole grain breads and cereals.  Foods and beverages sweetened with sugar alcohols, such as xylitol, sorbitol, and mannitol.  Some foods may be well  tolerated and may help thicken stool including:  Starchy foods, such as rice, toast, pasta, low-sugar cereal, oatmeal, grits, baked potatoes, crackers, and bagels.  Bananas.  Applesauce.  Add probiotic-rich foods to help increase healthy bacteria in the GI tract, such as yogurt and fermented milk products.  Wash your hands well after each diarrhea episode.  Only take over-the-counter or prescription medicines as directed by your caregiver.  Take a warm bath to relieve any burning or pain from frequent diarrhea episodes. SEEK IMMEDIATE MEDICAL CARE IF:   You are unable to keep fluids down.  You have persistent vomiting.  You have blood in your stool, or your stools are black and tarry.  You do not urinate in 6 8 hours, or there is only a small amount of very dark urine.  You have abdominal pain that increases or localizes.  You have weakness, dizziness, confusion, or lightheadedness.  You have a severe headache.  Your diarrhea gets worse or does not get better.  You have a fever or persistent symptoms for more than 2 3 days.  You have a fever and your symptoms suddenly get worse. MAKE SURE YOU:   Understand these instructions.  Will watch your condition.  Will get help right away if you are not doing well or get worse. Document Released: 10/10/2002 Document Revised: 10/06/2012 Document Reviewed: 06/27/2012 [facility] Patient Information 2014 [place], [place].

## 2013-11-28 ENCOUNTER — Telehealth: Payer: Self-pay | Admitting: Family Medicine

## 2013-11-28 ENCOUNTER — Encounter: Payer: Self-pay | Admitting: Family Medicine

## 2013-11-28 DIAGNOSIS — H9209 Otalgia, unspecified ear: Secondary | ICD-10-CM | POA: Insufficient documentation

## 2013-11-28 MED ORDER — LEVOTHYROXINE SODIUM 25 MCG PO TABS
ORAL_TABLET | ORAL | Status: DC
Start: 2013-11-28 — End: 2013-12-07

## 2013-11-28 NOTE — Assessment & Plan Note (Signed)
stable

## 2013-11-28 NOTE — Assessment & Plan Note (Signed)
Well controlled, no changes

## 2013-11-28 NOTE — Assessment & Plan Note (Signed)
Persistent, several episodes daily no bloody stool. Will consider a course of Welchol powder to see if that helps. May use Imodium prn. So far testing with [place] and GI is negative. Add a probiotic and a fiber supplement

## 2013-11-28 NOTE — Assessment & Plan Note (Signed)
Try Auralgan prn. Report worsening symptoms

## 2013-11-28 NOTE — Assessment & Plan Note (Signed)
Tolerating Silenor, may use prn

## 2013-11-28 NOTE — Assessment & Plan Note (Signed)
resolved 

## 2013-11-28 NOTE — Telephone Encounter (Signed)
Refills sent

## 2013-11-28 NOTE — Telephone Encounter (Signed)
Refill-levothyroxine 96mg tablet. Take one tablet by mouth daily. Qty 30 last fill 12.22.14

## 2013-12-01 ENCOUNTER — Other Ambulatory Visit: Payer: Self-pay | Admitting: Family Medicine

## 2013-12-06 ENCOUNTER — Encounter: Payer: Self-pay | Admitting: Gastroenterology

## 2013-12-06 ENCOUNTER — Telehealth: Payer: Self-pay | Admitting: Family Medicine

## 2013-12-06 ENCOUNTER — Ambulatory Visit (INDEPENDENT_AMBULATORY_CARE_PROVIDER_SITE_OTHER): Admitting: Gastroenterology

## 2013-12-06 VITALS — BP 118/68 | HR 76 | Ht 72.0 in | Wt 216.0 lb

## 2013-12-06 DIAGNOSIS — R197 Diarrhea, unspecified: Secondary | ICD-10-CM

## 2013-12-06 DIAGNOSIS — M109 Gout, unspecified: Secondary | ICD-10-CM

## 2013-12-06 MED ORDER — MOVIPREP 100 G PO SOLR: 1.0000 | Freq: Once | ORAL | Status: DC

## 2013-12-06 MED ORDER — DIPHENOXYLATE-ATROPINE 2.5-0.025 MG PO TABS: 1.0000 | ORAL_TABLET | Freq: Two times a day (BID) | ORAL | Status: DC

## 2013-12-06 NOTE — Progress Notes (Signed)
Review of pertinent gastrointestinal problems:  1. history of adenomatous colon polyps : Colonoscopy 12/10: "Two sessile polyps were found in hepatic flexure (8-53m), piecemeal resected and then tatoo'd with INigerInk" path showed "moderate to severe dysplasia" and he was recommended to have repeat examination at 3 month interval. Colonoscopy December 2012 found 4 polyps, one was piecemeal removed, adenoma. He was recommended to have repeat at 6 month recall. With pulm HTN, active, dyspnea this was changed to 04/2014.  2. incidentally noted esophageal varices on 2012 EGD done for dysphasia. 2012 Abdominal ultrasound showed a normal liver without clear sign of cirrhosis. Was told to cut back on drinking.  3. 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 From chronic etoh over use. Labs 12/12 Hep A Ab +, Hep B S ag and Ab negative, Hep C Ab neg; ANA, AIA, AMA, ceruloplasm, iron testing all neg;  Last liver imaging: 12/12 [facility] clear cirrhosis, no focal lesions  Last AFP: 12/2012 normal  Last EGD: 2012 small esophageal varices  Hep B immunization series started 11/2012  4. 2014 right heart angiogram suggested severe right heart failure, pulmonary hypertension started on macitentan 01/18/2013. Followed closely by CHF clinic who has assumed lead role in managing diuretics.  HPI: This is a   very pleasant 63year old man whom I last saw about 2 months ago.  C. Diff by pcr was neg last month.  Still with diarrhea.    He has loose stools, after any BM.  Takes imodium 2 pills, twice a day.  Still has 3-4 very loose stools daily. This has been going on for about 2 months.  Has not tried lomotil.  Diarrhea has been going on for a couple months.    Past Medical History  Diagnosis Date  . Hypertension   . Seizures   . Hx of colonic polyps   . GERD (gastroesophageal reflux disease)   . Gout   . Alcoholic  cirrhosis of liver with ascites 2014  . Unspecified hypothyroidism 01/25/2013  . Unspecified pleural effusion   . Other chronic pulmonary heart diseases   . Hypopotassemia   . Renal insufficiency 07/18/2013  . Diarrhea 09/04/2013  . Otalgia 11/28/2013    History reviewed. No pertinent past surgical history.  Current Outpatient Prescriptions  Medication Sig Dispense Refill  . allopurinol (ZYLOPRIM) 100 MG tablet Take 1 tablet (100 mg total) by mouth daily.  90 tablet  1  . antipyrine-benzocaine (AURALGAN) otic solution Place 2 drops into the left ear every 4 (four) hours as needed for ear pain.  10 mL  0  . Colesevelam HCl ([facility] 3.75 G PACK Take 1 packet by mouth daily. In 8 oz of fluids in evening  6 each  0  . Doxepin HCl (SILENOR) 6 MG TABS Take 6 mg by mouth daily.       . fluticasone (FLONASE) 50 MCG/ACT nasal spray Place 2 sprays into both nostrils daily.  16 g  6  . furosemide (LASIX) 40 MG tablet Take 1 tablet (40 mg total) by mouth as needed. Only take for weight 208 pounds or greater  30 tablet  6  . levothyroxine (SYNTHROID, LEVOTHROID) 25 MCG tablet TAKE 1 TABLET (25 MCG TOTAL) BY MOUTH DAILY.  30 tablet  3  . Macitentan (OPSUMIT) 10 MG TABS Take 10 mg by mouth daily.       . mometasone-formoterol (DULERA) 200-5  MCG/ACT AERO Inhale 2 puffs into the lungs 2 (two) times daily.  13 g  6  . omeprazole (PRILOSEC) 40 MG capsule TAKE 1 CAPSULE (40 MG TOTAL) BY MOUTH DAILY.  90 capsule  0  . potassium chloride (K-DUR) 10 MEQ tablet take4 tabs po (40 MeQ) BID  120 tablet  6  . saccharomyces boulardii (FLORASTOR) 250 MG capsule Take 1 capsule (250 mg total) by mouth 2 (two) times daily.  60 capsule  1  . spironolactone (ALDACTONE) 50 MG tablet Take 1 tablet (50 mg total) by mouth daily.  30 tablet  11  . Tadalafil, PAH, (ADCIRCA) 20 MG TABS Take 2 tablets (40 mg total) by mouth daily.  180 tablet  3  . verapamil (VERELAN PM) 180 MG 24 hr capsule TAKE 1 CAPSULE BY MOUTH TWICE DAILY  180  capsule  0  . zolpidem (AMBIEN) 10 MG tablet Take 1 tablet (10 mg total) by mouth at bedtime as needed for sleep.  30 tablet  2   No current facility-administered medications for this visit.    Allergies as of 12/06/2013 - Review Complete 12/06/2013  Allergen Reaction Noted  . Aspirin Other (See Comments) 03/21/2011    Family History  Problem Relation Age of Onset  . Heart disease Mother   . Prostate cancer Father   . Colon cancer Neg Hx   . Heart attack Mother   . Hypertension Mother     History   Social History  . Marital Status: Divorced    Spouse Name: N/A    Number of Children: 3  . Years of Education: N/A   Occupational History  . Retired     Social research officer, government   Social History Main Topics  . Smoking status: Former Smoker -- 2.00 packs/day for 5 years    Types: Cigarettes    Quit date: 09/22/1979  . Smokeless tobacco: Never Used  . Alcohol Use: Yes     Comment: 3-4 cans of beers and 4-5 drinks of vodka - has not drunk any alcohol in the last few weeks  . Drug Use: No  . Sexual Activity: Not on file   Other Topics Concern  . Not on file   Social History Narrative   0 caffeine drinks daily       Physical Exam: BP 118/68  Pulse 76  Ht 6' (1.829 m)  Wt 216 lb (97.977 kg)  BMI 29.29 kg/m2 Constitutional: generally well-appearing Psychiatric: alert and oriented x3 Abdomen: soft, nontender, nondistended, no obvious ascites, no peritoneal signs, normal bowel sounds     Assessment and plan: 63 y.o. male with diarrhea for the past 2-3 months  Unclear etiology, stool testing has to date been negative. He is going to add Lomotil to his 2 pills twice daily Imodium and we will arrange for colonoscopy to be performed at [facility] at his soonest convenience. Perhaps he is microscopic colitis. Perhaps this is new diagnosis of inflammatory bowel disease.

## 2013-12-06 NOTE — Patient Instructions (Addendum)
You will be set up for a colonoscopy at [place] with MAC or moderate sedation (this week or next). Add lomotil (1 pill twice daily) to your current imodium regimen.

## 2013-12-06 NOTE — Telephone Encounter (Signed)
Refill- l thyroxine 53mg tab  Refill- verelan sr 1861mcaps  Refill- omeprazole 4078maps  Refill-  allopuinol 100m72mb

## 2013-12-07 ENCOUNTER — Other Ambulatory Visit (HOSPITAL_COMMUNITY): Payer: Self-pay

## 2013-12-07 DIAGNOSIS — I2789 Other specified pulmonary heart diseases: Secondary | ICD-10-CM

## 2013-12-07 MED ORDER — MACITENTAN 10 MG PO TABS
10.0000 mg | ORAL_TABLET | Freq: Every day | ORAL | Status: DC
Start: 2013-12-07 — End: 2013-12-07

## 2013-12-07 MED ORDER — MACITENTAN 10 MG PO TABS: 10.0000 mg | ORAL_TABLET | Freq: Every day | ORAL | Status: DC

## 2013-12-07 MED ORDER — VERAPAMIL HCL ER 180 MG PO CP24: 180.0000 mg | ORAL_CAPSULE | Freq: Every day | ORAL | Status: DC

## 2013-12-07 MED ORDER — ALLOPURINOL 100 MG PO TABS: 100.0000 mg | ORAL_TABLET | Freq: Every day | ORAL | Status: DC

## 2013-12-07 MED ORDER — POTASSIUM CHLORIDE ER 10 MEQ PO TBCR: EXTENDED_RELEASE_TABLET | ORAL | Status: DC

## 2013-12-07 MED ORDER — LEVOTHYROXINE SODIUM 25 MCG PO TABS: ORAL_TABLET | ORAL | Status: DC

## 2013-12-07 MED ORDER — OMEPRAZOLE 40 MG PO CPDR: 40.0000 mg | DELAYED_RELEASE_CAPSULE | Freq: Every day | ORAL | Status: DC

## 2013-12-08 ENCOUNTER — Encounter (HOSPITAL_COMMUNITY)
Admission: RE | Disposition: A | Discharge status: Home / Self Care | Payer: Self-pay | Source: Ambulatory Visit | Attending: Gastroenterology

## 2013-12-08 ENCOUNTER — Encounter (HOSPITAL_COMMUNITY): Payer: Self-pay | Admitting: Gastroenterology

## 2013-12-08 ENCOUNTER — Other Ambulatory Visit (HOSPITAL_COMMUNITY): Payer: Self-pay

## 2013-12-08 ENCOUNTER — Ambulatory Visit (HOSPITAL_COMMUNITY)
Admission: RE | Admit: 2013-12-08 | Discharge: 2013-12-08 | Disposition: A | Discharge status: Home / Self Care | Source: Ambulatory Visit | Attending: Gastroenterology | Admitting: Gastroenterology

## 2013-12-08 DIAGNOSIS — R933 Abnormal findings on diagnostic imaging of other parts of digestive tract: Secondary | ICD-10-CM

## 2013-12-08 DIAGNOSIS — R197 Diarrhea, unspecified: Secondary | ICD-10-CM | POA: Insufficient documentation

## 2013-12-08 DIAGNOSIS — Z86010 Personal history of colonic polyps: Secondary | ICD-10-CM

## 2013-12-08 DIAGNOSIS — I2789 Other specified pulmonary heart diseases: Secondary | ICD-10-CM | POA: Insufficient documentation

## 2013-12-08 DIAGNOSIS — K7460 Unspecified cirrhosis of liver: Secondary | ICD-10-CM | POA: Insufficient documentation

## 2013-12-08 DIAGNOSIS — I509 Heart failure, unspecified: Secondary | ICD-10-CM | POA: Insufficient documentation

## 2013-12-08 DIAGNOSIS — D126 Benign neoplasm of colon, unspecified: Secondary | ICD-10-CM | POA: Insufficient documentation

## 2013-12-08 HISTORY — PX: COLONOSCOPY: SHX5424

## 2013-12-08 SURGERY — COLONOSCOPY
Anesthesia: Moderate Sedation

## 2013-12-08 MED ORDER — SODIUM CHLORIDE 0.9 % IV SOLN: INTRAVENOUS | Status: DC

## 2013-12-08 MED ORDER — POTASSIUM CHLORIDE ER 10 MEQ PO TBCR: EXTENDED_RELEASE_TABLET | ORAL | Status: DC

## 2013-12-08 MED ORDER — MIDAZOLAM HCL 5 MG/5ML IJ SOLN
INTRAMUSCULAR | Status: DC | PRN
  Administered 2013-12-08 (×4): 2.5 mg via INTRAVENOUS

## 2013-12-08 MED ORDER — DIPHENHYDRAMINE HCL 50 MG/ML IJ SOLN
INTRAMUSCULAR | Status: AC
  Filled 2013-12-08: qty 1

## 2013-12-08 MED ORDER — MIDAZOLAM HCL 10 MG/2ML IJ SOLN
INTRAMUSCULAR | Status: AC
Start: 2013-12-08 — End: 2013-12-08
  Filled 2013-12-08: qty 4

## 2013-12-08 MED ORDER — FENTANYL CITRATE 0.05 MG/ML IJ SOLN
INTRAMUSCULAR | Status: AC
  Filled 2013-12-08: qty 4

## 2013-12-08 MED ORDER — FENTANYL CITRATE 0.05 MG/ML IJ SOLN
INTRAMUSCULAR | Status: DC | PRN
  Administered 2013-12-08 (×4): 25 ug via INTRAVENOUS

## 2013-12-08 NOTE — Discharge Instructions (Signed)
YOU HAD AN ENDOSCOPIC PROCEDURE TODAY: Refer to the procedure report that was given to you for any specific questions about what was found during the examination.  If the procedure report does not answer your questions, please call your gastroenterologist to clarify.  YOU SHOULD EXPECT: Some feelings of bloating in the abdomen. Passage of more gas than usual.  Walking can help get rid of the air that was put into your GI tract during the procedure and reduce the bloating. If you had a lower endoscopy (such as a colonoscopy or flexible sigmoidoscopy) you may notice spotting of blood in your stool or on the toilet paper.   DIET: Your first meal following the procedure should be a light meal and then it is ok to progress to your normal diet.  A half-sandwich or bowl of soup is an example of a good first meal.  Heavy or fried foods are harder to digest and may make you feel nasueas or bloated.  Drink plenty of fluids but you should avoid alcoholic beverages for 24 hours.  ACTIVITY: Your care partner should take you home directly after the procedure.  You should plan to take it easy, moving slowly for the rest of the day.  You can resume normal activity the day after the procedure however you should NOT DRIVE or use heavy machinery for 24 hours (because of the sedation medicines used during the test).    SYMPTOMS TO REPORT IMMEDIATELY  A gastroenterologist can be reached at any hour.  Please call your doctor's office for any of the following symptoms:   Following lower endoscopy (colonoscopy, flexible sigmoidoscopy)  Excessive amounts of blood in the stool  Significant tenderness, worsening of abdominal pains  Swelling of the abdomen that is new, acute  Fever of 100 or higher  Following upper endoscopy (EGD, EUS, ERCP)  Vomiting of blood or coffee ground material  New, significant abdominal pain  New, significant chest pain or pain under the shoulder blades  Painful or persistently difficult  swallowing  New shortness of breath  Black, tarry-looking stools  FOLLOW UP: If any biopsies were taken you will be contacted by phone or by letter within the next 1-3 weeks.  Call your gastroenterologist if you have not heard about the biopsies in 3 weeks.  Please also call your gastroenterologist's office with any specific questions about appointments or follow up tests.   Conscious Sedation, Adult, Care After Refer to this sheet in the next few weeks. These instructions provide you with information on caring for yourself after your procedure. Your health care provider may also give you more specific instructions. Your treatment has been planned according to current medical practices, but problems sometimes occur. Call your health care provider if you have any problems or questions after your procedure. WHAT TO EXPECT AFTER THE PROCEDURE  After your procedure:  You may feel sleepy, clumsy, and have poor balance for several hours.  Vomiting may occur if you eat too soon after the procedure. HOME CARE INSTRUCTIONS  Do not participate in any activities where you could become injured for at least 24 hours. Do not:  Drive.  Swim.  Ride a bicycle.  Operate heavy machinery.  Cook.  Use power tools.  Climb ladders.  Work from a high place.  Do not make important decisions or sign legal documents until you are improved.  If you vomit, drink water, juice, or soup when you can drink without vomiting. Make sure you have little or no nausea before  eating solid foods.  Only take over-the-counter or prescription medicines for pain, discomfort, or fever as directed by your health care provider.  Make sure you and your family fully understand everything about the medicines given to you, including what side effects may occur.  You should not drink alcohol, take sleeping pills, or take medicines that cause drowsiness for at least 24 hours.  If you smoke, do not smoke without  supervision.  If you are feeling better, you may resume normal activities 24 hours after you were sedated.  Keep all appointments with your health care provider. SEEK MEDICAL CARE IF:  Your skin is pale or bluish in color.  You continue to feel nauseous or vomit.  Your pain is getting worse and is not helped by medicine.  You have bleeding or swelling.  You are still sleepy or feeling clumsy after 24 hours. SEEK IMMEDIATE MEDICAL CARE IF:  You develop a rash.  You have difficulty breathing.  You develop any type of allergic problem.  You have a fever. MAKE SURE YOU:  Understand these instructions.  Will watch your condition.  Will get help right away if you are not doing well or get worse. Document Released: 08/10/2013 Document Reviewed: 05/27/2013 [facility] Patient Information 2014 [place], [place].

## 2013-12-08 NOTE — H&P (View-Only) (Signed)
Review of pertinent gastrointestinal problems:  1. history of adenomatous colon polyps : Colonoscopy 12/10: "Two sessile polyps were found in hepatic flexure (8-53m), piecemeal resected and then tatoo'd with INigerInk" path showed "moderate to severe dysplasia" and he was recommended to have repeat examination at 3 month interval. Colonoscopy December 2012 found 4 polyps, one was piecemeal removed, adenoma. He was recommended to have repeat at 6 month recall. With pulm HTN, active, dyspnea this was changed to 04/2014.  2. incidentally noted esophageal varices on 2012 EGD done for dysphasia. 2012 Abdominal ultrasound showed a normal liver without clear sign of cirrhosis. Was told to cut back on drinking.  3. 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 From chronic etoh over use. Labs 12/12 Hep A Ab +, Hep B S ag and Ab negative, Hep C Ab neg; ANA, AIA, AMA, ceruloplasm, iron testing all neg;  Last liver imaging: 12/12 [facility] clear cirrhosis, no focal lesions  Last AFP: 12/2012 normal  Last EGD: 2012 small esophageal varices  Hep B immunization series started 11/2012  4. 2014 right heart angiogram suggested severe right heart failure, pulmonary hypertension started on macitentan 01/18/2013. Followed closely by CHF clinic who has assumed lead role in managing diuretics.  HPI: This is a   very pleasant 63year old man whom I last saw about 2 months ago.  C. Diff by pcr was neg last month.  Still with diarrhea.    He has loose stools, after any BM.  Takes imodium 2 pills, twice a day.  Still has 3-4 very loose stools daily. This has been going on for about 2 months.  Has not tried lomotil.  Diarrhea has been going on for a couple months.    Past Medical History  Diagnosis Date  . Hypertension   . Seizures   . Hx of colonic polyps   . GERD (gastroesophageal reflux disease)   . Gout   . Alcoholic  cirrhosis of liver with ascites 2014  . Unspecified hypothyroidism 01/25/2013  . Unspecified pleural effusion   . Other chronic pulmonary heart diseases   . Hypopotassemia   . Renal insufficiency 07/18/2013  . Diarrhea 09/04/2013  . Otalgia 11/28/2013    History reviewed. No pertinent past surgical history.  Current Outpatient Prescriptions  Medication Sig Dispense Refill  . allopurinol (ZYLOPRIM) 100 MG tablet Take 1 tablet (100 mg total) by mouth daily.  90 tablet  1  . antipyrine-benzocaine (AURALGAN) otic solution Place 2 drops into the left ear every 4 (four) hours as needed for ear pain.  10 mL  0  . Colesevelam HCl ([facility] 3.75 G PACK Take 1 packet by mouth daily. In 8 oz of fluids in evening  6 each  0  . Doxepin HCl (SILENOR) 6 MG TABS Take 6 mg by mouth daily.       . fluticasone (FLONASE) 50 MCG/ACT nasal spray Place 2 sprays into both nostrils daily.  16 g  6  . furosemide (LASIX) 40 MG tablet Take 1 tablet (40 mg total) by mouth as needed. Only take for weight 208 pounds or greater  30 tablet  6  . levothyroxine (SYNTHROID, LEVOTHROID) 25 MCG tablet TAKE 1 TABLET (25 MCG TOTAL) BY MOUTH DAILY.  30 tablet  3  . Macitentan (OPSUMIT) 10 MG TABS Take 10 mg by mouth daily.       . mometasone-formoterol (DULERA) 200-5  MCG/ACT AERO Inhale 2 puffs into the lungs 2 (two) times daily.  13 g  6  . omeprazole (PRILOSEC) 40 MG capsule TAKE 1 CAPSULE (40 MG TOTAL) BY MOUTH DAILY.  90 capsule  0  . potassium chloride (K-DUR) 10 MEQ tablet take4 tabs po (40 MeQ) BID  120 tablet  6  . saccharomyces boulardii (FLORASTOR) 250 MG capsule Take 1 capsule (250 mg total) by mouth 2 (two) times daily.  60 capsule  1  . spironolactone (ALDACTONE) 50 MG tablet Take 1 tablet (50 mg total) by mouth daily.  30 tablet  11  . Tadalafil, PAH, (ADCIRCA) 20 MG TABS Take 2 tablets (40 mg total) by mouth daily.  180 tablet  3  . verapamil (VERELAN PM) 180 MG 24 hr capsule TAKE 1 CAPSULE BY MOUTH TWICE DAILY  180  capsule  0  . zolpidem (AMBIEN) 10 MG tablet Take 1 tablet (10 mg total) by mouth at bedtime as needed for sleep.  30 tablet  2   No current facility-administered medications for this visit.    Allergies as of 12/06/2013 - Review Complete 12/06/2013  Allergen Reaction Noted  . Aspirin Other (See Comments) 03/21/2011    Family History  Problem Relation Age of Onset  . Heart disease Mother   . Prostate cancer Father   . Colon cancer Neg Hx   . Heart attack Mother   . Hypertension Mother     History   Social History  . Marital Status: Divorced    Spouse Name: N/A    Number of Children: 3  . Years of Education: N/A   Occupational History  . Retired     Social research officer, government   Social History Main Topics  . Smoking status: Former Smoker -- 2.00 packs/day for 5 years    Types: Cigarettes    Quit date: 09/22/1979  . Smokeless tobacco: Never Used  . Alcohol Use: Yes     Comment: 3-4 cans of beers and 4-5 drinks of vodka - has not drunk any alcohol in the last few weeks  . Drug Use: No  . Sexual Activity: Not on file   Other Topics Concern  . Not on file   Social History Narrative   0 caffeine drinks daily       Physical Exam: BP 118/68  Pulse 76  Ht 6' (1.829 m)  Wt 216 lb (97.977 kg)  BMI 29.29 kg/m2 Constitutional: generally well-appearing Psychiatric: alert and oriented x3 Abdomen: soft, nontender, nondistended, no obvious ascites, no peritoneal signs, normal bowel sounds     Assessment and plan: 63 y.o. male with diarrhea for the past 2-3 months  Unclear etiology, stool testing has to date been negative. He is going to add Lomotil to his 2 pills twice daily Imodium and we will arrange for colonoscopy to be performed at [facility] at his soonest convenience. Perhaps he is microscopic colitis. Perhaps this is new diagnosis of inflammatory bowel disease.

## 2013-12-08 NOTE — Interval H&P Note (Signed)
History and Physical Interval Note:  12/08/2013 1:35 PM  Darren Mueller  has presented today for surgery, with the diagnosis of Diarrhea [787.91]  The various methods of treatment have been discussed with the patient and family. After consideration of risks, benefits and other options for treatment, the patient has consented to  Procedure(s): COLONOSCOPY (N/A) as a surgical intervention .  The patient's history has been reviewed, patient examined, no change in status, stable for surgery.  I have reviewed the patient's chart and labs.  Questions were answered to the patient's satisfaction.     Milus Banister

## 2013-12-08 NOTE — Op Note (Signed)
[facility] [place], [address]   COLONOSCOPY PROCEDURE REPORT  PATIENT: Darren Mueller, Darren Mueller  MR#: [id number] BIRTHDATE: 24-Oct-1951 , 72  yrs. old GENDER: Male ENDOSCOPIST: Milus Banister, MD PROCEDURE DATE:  12/08/2013 PROCEDURE:   Colonoscopy with biopsy and Colonoscopy with snare polypectomy First Screening Colonoscopy - Avg.  risk and is 50 yrs.  old or older - No.  Prior Negative Screening - Now for repeat screening. N/A  History of Adenoma - Now for follow-up colonoscopy & has been > or = to 3 yrs.  Yes hx of adenoma.  Has been 3 or more years since last colonoscopy.  Polyps Removed Today? Yes. ASA CLASS:   Class III INDICATIONS:history of adenomatous colon polyps  and recent diarrhea: Colonoscopy 12/10: "Two sessile polyps were found in hepatic flexure (8-73m), piecemeal resected and then tatoo'd with INigerInk" path showed "moderate to severe dysplasia" and he was recommended to have repeat examination at 3 month interval. Colonoscopy December 2012 found 4 polyps, one was piecemeal removed, adenoma.  He was recommended to have repeat at 6 month recall.  With pulm HTN, active, dyspnea this was changed to 04/2014..Marland KitchenMEDICATIONS: Fentanyl 100 mcg IV, Versed 10 mg IV, and These medications were titrated to patient response per physician's verbal order  DESCRIPTION OF PROCEDURE:   After the risks benefits and alternatives of the procedure were thoroughly explained, informed consent was obtained.  A digital rectal exam revealed no abnormalities of the rectum.   The Pentax Adult Colonscope [id number] was introduced through the anus and advanced to the terminal ileum which was intubated for a short distance. No adverse events experienced.   The quality of the prep was excellent.  The instrument was then slowly withdrawn as the colon was fully examined.  COLON FINDINGS: The terminal ileum was normal.  One polyp was found, removed and sent to  pathology.  This was 371macross, sessile, located in ascending segment, removed with cold snare.  The mucosa in the right colon was somewhat erythematous with loss of normal vascular pattern.  Biopsies were taken from right colon and sent to pathology.  The left colon mucosa was normal, biopsies were also taken and sent to pathology.  The examination was otherwise normal. Retroflexed views revealed no abnormalities. The time to cecum=3 minutes 00 seconds.  Withdrawal time=10 minutes 00 seconds.  The scope was withdrawn and the procedure completed. COMPLICATIONS: There were no complications.  ENDOSCOPIC IMPRESSION: See above  RECOMMENDATIONS: Await pathology results for polyp follow up and recommendations for your diarrhea.  eSigned:  DaMilus BanisterMD 12/08/2013 2:25 PM

## 2013-12-09 ENCOUNTER — Telehealth (HOSPITAL_COMMUNITY): Payer: Self-pay | Admitting: *Deleted

## 2013-12-09 ENCOUNTER — Encounter (HOSPITAL_COMMUNITY): Payer: Self-pay | Admitting: Gastroenterology

## 2013-12-09 NOTE — Telephone Encounter (Signed)
Called pt with results from monitor, per Dr Haroldine Laws NSR no arrhythmia, left mess with results for pt

## 2013-12-12 ENCOUNTER — Telehealth: Payer: Self-pay | Admitting: Family Medicine

## 2013-12-12 DIAGNOSIS — M109 Gout, unspecified: Secondary | ICD-10-CM

## 2013-12-12 MED ORDER — OMEPRAZOLE 40 MG PO CPDR: 40.0000 mg | DELAYED_RELEASE_CAPSULE | Freq: Every day | ORAL | Status: DC

## 2013-12-12 MED ORDER — VERAPAMIL HCL ER 180 MG PO CP24: 180.0000 mg | ORAL_CAPSULE | Freq: Every day | ORAL | Status: DC

## 2013-12-12 MED ORDER — ALLOPURINOL 100 MG PO TABS: 100.0000 mg | ORAL_TABLET | Freq: Every day | ORAL | Status: DC

## 2013-12-12 MED ORDER — LEVOTHYROXINE SODIUM 25 MCG PO TABS: ORAL_TABLET | ORAL | Status: DC

## 2013-12-12 NOTE — Telephone Encounter (Signed)
90 day supply request  Omeprazole  verelan sr caps  l- thyroxine  Allopurinol

## 2013-12-26 ENCOUNTER — Ambulatory Visit (HOSPITAL_COMMUNITY)
Admission: RE | Admit: 2013-12-26 | Discharge: 2013-12-26 | Disposition: A | Discharge status: Home / Self Care | Source: Ambulatory Visit | Attending: Internal Medicine | Admitting: Internal Medicine

## 2013-12-26 ENCOUNTER — Encounter (HOSPITAL_COMMUNITY): Payer: Self-pay

## 2013-12-26 VITALS — BP 120/78 | HR 83 | Wt 217.0 lb

## 2013-12-26 DIAGNOSIS — I2789 Other specified pulmonary heart diseases: Secondary | ICD-10-CM | POA: Insufficient documentation

## 2013-12-26 DIAGNOSIS — K766 Portal hypertension: Secondary | ICD-10-CM | POA: Insufficient documentation

## 2013-12-26 DIAGNOSIS — I2721 Secondary pulmonary arterial hypertension: Secondary | ICD-10-CM

## 2013-12-26 LAB — BASIC METABOLIC PANEL
BUN: 25 mg/dL — ABNORMAL HIGH (ref 6–23)
CALCIUM: 9.2 mg/dL (ref 8.4–10.5)
CO2: 20 meq/L (ref 19–32)
CREATININE: 1.57 mg/dL — AB (ref 0.50–1.35)
Chloride: 109 mEq/L (ref 96–112)
GFR calc Af Amer: 52 mL/min — ABNORMAL LOW (ref >90)
GFR calc non Af Amer: 45 mL/min — ABNORMAL LOW (ref >90)
Glucose, Bld: 94 mg/dL (ref 70–99)
Potassium: 4.4 mEq/L (ref 3.7–5.3)
SODIUM: 142 meq/L (ref 137–147)

## 2013-12-26 MED ORDER — POTASSIUM CHLORIDE ER 10 MEQ PO TBCR: 20.0000 meq | EXTENDED_RELEASE_TABLET | Freq: Once | ORAL | Status: DC

## 2013-12-26 MED ORDER — FUROSEMIDE 40 MG PO TABS: 40.0000 mg | ORAL_TABLET | Freq: Every day | ORAL | Status: DC

## 2013-12-26 MED ORDER — LOSARTAN POTASSIUM 100 MG PO TABS: 50.0000 mg | ORAL_TABLET | Freq: Every day | ORAL | Status: DC

## 2013-12-26 NOTE — Patient Instructions (Addendum)
Follow up in 1 month with Dr Haroldine Laws  Take losartan 50 mg daily  Take 20 meq potassium daily  Take lasix 40 mg dialy   Do the following things EVERYDAY: 1) Weigh yourself in the morning before breakfast. Write it down and keep it in a log. 2) Take your medicines as prescribed 3) Eat low salt foods-Limit salt (sodium) to 2000 mg per day.  4) Stay as active as you can everyday 5) Limit all fluids for the day to less than 2 liters

## 2013-12-26 NOTE — Progress Notes (Signed)
Patient ID: Darren Mueller, male   DOB: Jan 22, 1951, 63 y.o.   MRN: [id number]  GI : Dr Ardis Hughs PCP: DrBlyth  Subjective:    63 yo with history of COPD, portopulmonary hypertension with RV failure, and ETOH cirrhosis presents after syncopal episode. Over the past 2 weeks, he has had loose stool 2-3 times/day every day. He was started on cholestyramine by his PCP.    No prior history of syncope. Adcirca was increased to 40 mg daily back in 10/14 with no problems.   Admitted in December with syncope last week in the setting of persistent diarrhea. C.diff and O&P negative.  Creatinine was up to 2.7 from 1.7 baseline. He was hydrated. Losartan and lasix stopped. Told to hold lasix unless weight 208 or greater.   Event Monitor - NSR 10/26/13.   He returns for follow up. Complaining of dyspnea with exertion. Says he has to take his time going to the grocery store. Requires frequent breaks. Not weighing at home. Denies presyncope. Still having diarrhea.  Compliant with medications. During hospitalization in December we stopped losartan however he continued.    10/19/13 Creatinine 1.79 K 4.1   ECHO EF 55-60% Peak PA pressure 35. Severe RV dysfunction   Objective:   Weight Range:  Vital Signs:   Pulse Rate:  [83] 83 (02/23 1127) BP: (120)/(78) 120/78 mmHg (02/23 1127) SpO2:  [98 %] 98 % (02/23 1127) Weight:  [217 lb (98.431 kg)] 217 lb (98.431 kg) (02/23 1127)    Weight change: Filed Weights   12/26/13 1127  Weight: 217 lb (98.431 kg)    Intake/Output:  No intake or output data in the 24 hours ending 12/26/13 1151   Physical Exam: General: NAD  Neck: JVP ~10 , no thyromegaly or thyroid nodule.  Lungs: Clear to auscultation bilaterally with normal respiratory effort.  CV: Nondisplaced PMI. Heart regular  2/6 TR P2 LLSB. No peripheral edema. No carotid bruit. Normal pedal pulses.  Abdomen: Soft, nontender, no hepatosplenomegaly,++ distention.  Skin: Intact without lesions or rashes.   Neurologic: Alert and oriented x 3.  Psych: Normal affect.  Extremities: No clubbing or cyanosis.  HEENT: Normal.   Labs: Basic Metabolic Panel: No results found for this basename: NA, K, CL, CO2, GLUCOSE, BUN, CREATININE, CALCIUM, MG, PHOS,  in the last 168 hours  Liver Function Tests: No results found for this basename: AST, ALT, ALKPHOS, BILITOT, PROT, ALBUMIN,  in the last 168 hours No results found for this basename: LIPASE, AMYLASE,  in the last 168 hours No results found for this basename: AMMONIA,  in the last 168 hours  CBC: No results found for this basename: WBC, NEUTROABS, HGB, HCT, MCV, PLT,  in the last 168 hours  Cardiac Enzymes: No results found for this basename: CKTOTAL, CKMB, CKMBINDEX, TROPONINI,  in the last 168 hours  BNP: BNP (last 3 results)  Recent Labs  08/29/13 0936 10/18/13 0205  PROBNP 1262.0* 619.8*     Other results:  :   Imaging: No results found.   Medications:      Medication List    ASK your doctor about these medications       allopurinol 100 MG tablet  Commonly known as:  ZYLOPRIM  Take 1 tablet (100 mg total) by mouth daily.     Colesevelam HCl 3.75 G Pack  Commonly known as:  WELCHOL  Take 1 packet by mouth daily. In 8 oz of fluids in evening     diphenoxylate-atropine 2.5-0.025 MG per tablet  Commonly known as:  LOMOTIL  Take 1 tablet by mouth 2 (two) times daily.     fluticasone 50 MCG/ACT nasal spray  Commonly known as:  FLONASE  Place 2 sprays into both nostrils daily.     levothyroxine 25 MCG tablet  Commonly known as:  SYNTHROID, LEVOTHROID  TAKE 1 TABLET (25 MCG TOTAL) BY MOUTH DAILY.     losartan 100 MG tablet  Commonly known as:  COZAAR  Take 100 mg by mouth daily.     Macitentan 10 MG Tabs  Commonly known as:  OPSUMIT  Take 10 mg by mouth daily.     mometasone-formoterol 200-5 MCG/ACT Aero  Commonly known as:  DULERA  Inhale 2 puffs into the lungs 2 (two) times daily.     omeprazole 40 MG  capsule  Commonly known as:  PRILOSEC  Take 1 capsule (40 mg total) by mouth daily.     potassium chloride 10 MEQ tablet  Commonly known as:  K-DUR  take4 tabs po (40 MeQ) BID     saccharomyces boulardii 250 MG capsule  Commonly known as:  FLORASTOR  Take 1 capsule (250 mg total) by mouth 2 (two) times daily.     SILENOR 6 MG Tabs  Generic drug:  Doxepin HCl  Take 6 mg by mouth daily.     spironolactone 50 MG tablet  Commonly known as:  ALDACTONE  Take 1 tablet (50 mg total) by mouth daily.     Tadalafil (PAH) 20 MG Tabs  Commonly known as:  ADCIRCA  Take 2 tablets (40 mg total) by mouth daily.     verapamil 180 MG 24 hr capsule  Commonly known as:  VERELAN PM  Take 180 mg by mouth 2 (two) times daily.     zolpidem 10 MG tablet  Commonly known as:  AMBIEN  Take 1 tablet (10 mg total) by mouth at bedtime as needed for sleep.        Assessment/Plan   1.. PAH - suspected portopulmonary HTN  Volume status mildly elevated. Instructed to take lasix 40 mg daily. Cut back potassium to 20 meq daily.  Continue spironolactone 50 mg dialy. Instructed to weigh and record daily.   Continue macitentan and adcirca 40 mg daily.  Check 6 MW in 2 weeks.  Check BMET today.  2. Syncope- Event monitor. NSR   3. Chronic Renal Failure - reatinine baseline 1.7  4. Diarrhea- c.diff negative. Follow up in with GI. Dr Ardis Hughs.  5. ETOH cirrhosis -  6. HTN - Cut back losartan to 50 mg daily. Continue spironolactone 50 mg daily.   Follow up in 1 month. Schedule ECHO at next visit.   Length of Stay: 0  CLEGG,AMY NP-C 12/26/2013, 11:51 AM   Patient seen and examined with Darrick Grinder, NP. We discussed all aspects of the encounter. I agree with the assessment and plan as stated above.   Continues to struggle a bit. NYHA III. I suspect he may not be as complaint with his medications as he reports. Weight is up. Will increase lasix to 40 daily. Cut back losartan. Reviewed his meds carefully with him.  Will repeat echo and 6MW. May have to consider IV therapies at some point though I am not sure he will be interested. Check labs today.   Daniel Bensimhon,MD 8:45 PM

## 2013-12-28 ENCOUNTER — Other Ambulatory Visit (HOSPITAL_COMMUNITY): Payer: Self-pay | Admitting: *Deleted

## 2013-12-28 MED ORDER — POTASSIUM CHLORIDE ER 10 MEQ PO TBCR: 20.0000 meq | EXTENDED_RELEASE_TABLET | Freq: Every day | ORAL | Status: DC

## 2014-01-05 ENCOUNTER — Ambulatory Visit (INDEPENDENT_AMBULATORY_CARE_PROVIDER_SITE_OTHER): Admitting: Family Medicine

## 2014-01-05 ENCOUNTER — Telehealth: Payer: Self-pay | Admitting: Family Medicine

## 2014-01-05 ENCOUNTER — Encounter: Payer: Self-pay | Admitting: Family Medicine

## 2014-01-05 VITALS — BP 120/78 | HR 68 | Temp 97.8°F | Ht 73.0 in | Wt 212.1 lb

## 2014-01-05 DIAGNOSIS — I10 Essential (primary) hypertension: Secondary | ICD-10-CM

## 2014-01-05 DIAGNOSIS — L259 Unspecified contact dermatitis, unspecified cause: Secondary | ICD-10-CM

## 2014-01-05 DIAGNOSIS — D649 Anemia, unspecified: Secondary | ICD-10-CM

## 2014-01-05 DIAGNOSIS — N289 Disorder of kidney and ureter, unspecified: Secondary | ICD-10-CM

## 2014-01-05 DIAGNOSIS — E039 Hypothyroidism, unspecified: Secondary | ICD-10-CM

## 2014-01-05 DIAGNOSIS — I2789 Other specified pulmonary heart diseases: Secondary | ICD-10-CM

## 2014-01-05 DIAGNOSIS — R197 Diarrhea, unspecified: Secondary | ICD-10-CM

## 2014-01-05 DIAGNOSIS — I2729 Other secondary pulmonary hypertension: Secondary | ICD-10-CM

## 2014-01-05 DIAGNOSIS — L309 Dermatitis, unspecified: Secondary | ICD-10-CM

## 2014-01-05 DIAGNOSIS — IMO0002 Reserved for concepts with insufficient information to code with codable children: Secondary | ICD-10-CM

## 2014-01-05 DIAGNOSIS — I50810 Right heart failure, unspecified: Secondary | ICD-10-CM

## 2014-01-05 DIAGNOSIS — N183 Chronic kidney disease, stage 3 unspecified: Secondary | ICD-10-CM

## 2014-01-05 LAB — CBC
HEMATOCRIT: 39.1 % (ref 39.0–52.0)
Hemoglobin: 13.1 g/dL (ref 13.0–17.0)
MCH: 33.6 pg (ref 26.0–34.0)
MCHC: 33.5 g/dL (ref 30.0–36.0)
MCV: 100.3 fL — ABNORMAL HIGH (ref 78.0–100.0)
Platelets: 76 10*3/uL — ABNORMAL LOW (ref 150–400)
RBC: 3.9 MIL/uL — ABNORMAL LOW (ref 4.22–5.81)
RDW: 13.8 % (ref 11.5–15.5)
WBC: 3.9 10*3/uL — ABNORMAL LOW (ref 4.0–10.5)

## 2014-01-05 LAB — RENAL FUNCTION PANEL
ALBUMIN: 4.8 g/dL (ref 3.5–5.2)
BUN: 49 mg/dL — AB (ref 6–23)
CALCIUM: 9.5 mg/dL (ref 8.4–10.5)
CO2: 20 mEq/L (ref 19–32)
Chloride: 103 mEq/L (ref 96–112)
Creat: 1.78 mg/dL — ABNORMAL HIGH (ref 0.50–1.35)
GLUCOSE: 100 mg/dL — AB (ref 70–99)
Phosphorus: 3.7 mg/dL (ref 2.3–4.6)
Potassium: 4.2 mEq/L (ref 3.5–5.3)
Sodium: 136 mEq/L (ref 135–145)

## 2014-01-05 MED ORDER — TRIAMCINOLONE ACETONIDE 0.1 % EX CREA: 1.0000 "application " | TOPICAL_CREAM | Freq: Two times a day (BID) | CUTANEOUS | Status: DC | PRN

## 2014-01-05 NOTE — Patient Instructions (Signed)
No Triclosan in toothpaste, soap etc Restart Cetirizine 10 mg daily for next month or so for skin and ears   Contact Dermatitis Contact dermatitis is a reaction to certain substances that touch the skin. Contact dermatitis can be either irritant contact dermatitis or allergic contact dermatitis. Irritant contact dermatitis does not require previous exposure to the substance for a reaction to occur.Allergic contact dermatitis only occurs if you have been exposed to the substance before. Upon a repeat exposure, your body reacts to the substance.  CAUSES  Many substances can cause contact dermatitis. Irritant dermatitis is most commonly caused by repeated exposure to mildly irritating substances, such as:  Makeup.  Soaps.  Detergents.  Bleaches.  Acids.  Metal salts, such as nickel. Allergic contact dermatitis is most commonly caused by exposure to:  Poisonous plants.  Chemicals (deodorants, shampoos).  Jewelry.  Latex.  Neomycin in triple antibiotic cream.  Preservatives in products, including clothing. SYMPTOMS  The area of skin that is exposed may develop:  Dryness or flaking.  Redness.  Cracks.  Itching.  Pain or a burning sensation.  Blisters. With allergic contact dermatitis, there may also be swelling in areas such as the eyelids, mouth, or genitals.  DIAGNOSIS  Your caregiver can usually tell what the problem is by doing a physical exam. In cases where the cause is uncertain and an allergic contact dermatitis is suspected, a patch skin test may be performed to help determine the cause of your dermatitis. TREATMENT Treatment includes protecting the skin from further contact with the irritating substance by avoiding that substance if possible. Barrier creams, powders, and gloves may be helpful. Your caregiver may also recommend:  Steroid creams or ointments applied 2 times daily. For best results, soak the rash area in cool water for 20 minutes. Then apply the  medicine. Cover the area with a plastic wrap. You can store the steroid cream in the refrigerator for a "chilly" effect on your rash. That may decrease itching. Oral steroid medicines may be needed in more severe cases.  Antibiotics or antibacterial ointments if a skin infection is present.  Antihistamine lotion or an antihistamine taken by mouth to ease itching.  Lubricants to keep moisture in your skin.  Burow's solution to reduce redness and soreness or to dry a weeping rash. Mix one packet or tablet of solution in 2 cups cool water. Dip a clean washcloth in the mixture, wring it out a bit, and put it on the affected area. Leave the cloth in place for 30 minutes. Do this as often as possible throughout the day.  Taking several cornstarch or baking soda baths daily if the area is too large to cover with a washcloth. Harsh chemicals, such as alkalis or acids, can cause skin damage that is like a burn. You should flush your skin for 15 to 20 minutes with cold water after such an exposure. You should also seek immediate medical care after exposure. Bandages (dressings), antibiotics, and pain medicine may be needed for severely irritated skin.  HOME CARE INSTRUCTIONS  Avoid the substance that caused your reaction.  Keep the area of skin that is affected away from hot water, soap, sunlight, chemicals, acidic substances, or anything else that would irritate your skin.  Do not scratch the rash. Scratching may cause the rash to become infected.  You may take cool baths to help stop the itching.  Only take over-the-counter or prescription medicines as directed by your caregiver.  See your caregiver for follow-up care as  directed to make sure your skin is healing properly. SEEK MEDICAL CARE IF:   Your condition is not better after 3 days of treatment.  You seem to be getting worse.  You see signs of infection such as swelling, tenderness, redness, soreness, or warmth in the affected  area.  You have any problems related to your medicines. Document Released: 10/17/2000 Document Revised: 01/12/2012 Document Reviewed: 03/25/2011 [facility] Patient Information 2014 [place], [place].

## 2014-01-05 NOTE — Telephone Encounter (Signed)
Lab order week ofReturn in about 10 weeks (around 03/16/2014) for lipid, renal, cbc, tsh, hepatic prior. Diagnostic follow-up: Instructions: Check out comments: Add uric acid to next labs  03-07-2014

## 2014-01-05 NOTE — Progress Notes (Signed)
Pre visit review using our clinic review tool, if applicable. No additional management support is needed unless otherwise documented below in the visit note.

## 2014-01-06 ENCOUNTER — Telehealth: Payer: Self-pay | Admitting: Family Medicine

## 2014-01-06 NOTE — Telephone Encounter (Signed)
Relevant patient education assigned to patient using Emmi. ° °

## 2014-01-08 ENCOUNTER — Encounter: Payer: Self-pay | Admitting: Family Medicine

## 2014-01-08 NOTE — Assessment & Plan Note (Signed)
Stable and following with pulmonology

## 2014-01-08 NOTE — Assessment & Plan Note (Signed)
Encouraged probiotics, cultures were negative and his diarrhea was better right after colonoscopy but has returned, has been hesitant to take Welchol has follow up with gastroenterology soon.

## 2014-01-08 NOTE — Progress Notes (Signed)
Patient ID: Darren Mueller, male   DOB: 1951/10/08, 63 y.o.   MRN: [id number] Darren Mueller [id number] 21-Jun-1951 01/08/2014      Progress Note-Follow Up  Subjective  Chief Complaint  Chief Complaint  Patient presents with  . Follow-up    6 week     HPI  Patient is a 63 year old African American male who is in today for followup. Continues to struggle with the ears being clogged and uncomfortable. Denies any headache, sore throat, fevers, this equilibrium. Does note mild decrease in hearing. No chest pain or palpitations. No shortness of breath, or GU concerns. Patient's had a colonoscopy recently his diarrhea improved after the colonoscopy but has returned. No bloody or tarry stool. No fevers or chills. No anorexia, nausea or vomiting  Past Medical History  Diagnosis Date  . Hypertension   . Seizures   . Hx of colonic polyps   . GERD (gastroesophageal reflux disease)   . Gout   . Alcoholic cirrhosis of liver with ascites 2014  . Unspecified hypothyroidism 01/25/2013  . Unspecified pleural effusion   . Other chronic pulmonary heart diseases   . Hypopotassemia   . Renal insufficiency 07/18/2013  . Diarrhea 09/04/2013  . Otalgia 11/28/2013    Past Surgical History  Procedure Laterality Date  . Colonoscopy N/A 12/08/2013    Procedure: COLONOSCOPY;  Surgeon: Milus Banister, MD;  Location: [facility];  Service: Endoscopy;  Laterality: N/A;    Family History  Problem Relation Age of Onset  . Heart disease Mother   . Prostate cancer Father   . Colon cancer Neg Hx   . Heart attack Mother   . Hypertension Mother     History   Social History  . Marital Status: Divorced    Spouse Name: N/A    Number of Children: 3  . Years of Education: N/A   Occupational History  . Retired     Social research officer, government   Social History Main Topics  . Smoking status: Former Smoker -- 2.00 packs/day for 5 years    Types: Cigarettes    Quit date: 09/22/1979  . Smokeless tobacco: Never Used  . Alcohol  Use: Yes     Comment: 3-4 cans of beers and 4-5 drinks of vodka - has not drunk any alcohol in the last few weeks  . Drug Use: No  . Sexual Activity: Not on file   Other Topics Concern  . Not on file   Social History Narrative   0 caffeine drinks daily     Current Outpatient Prescriptions on File Prior to Visit  Medication Sig Dispense Refill  . allopurinol (ZYLOPRIM) 100 MG tablet Take 1 tablet (100 mg total) by mouth daily.  90 tablet  1  . Colesevelam HCl [facility]) 3.75 G PACK Take 1 packet by mouth daily. In 8 oz of fluids in evening  6 each  0  . diphenoxylate-atropine (LOMOTIL) 2.5-0.025 MG per tablet Take 1 tablet by mouth 2 (two) times daily.  60 tablet  3  . Doxepin HCl (SILENOR) 6 MG TABS Take 6 mg by mouth daily.       . fluticasone (FLONASE) 50 MCG/ACT nasal spray Place 2 sprays into both nostrils daily.  16 g  6  . furosemide (LASIX) 40 MG tablet Take 1 tablet (40 mg total) by mouth daily. Only take for weight 208 pounds or greater  30 tablet  6  . levothyroxine (SYNTHROID, LEVOTHROID) 25 MCG tablet TAKE 1 TABLET (25 MCG TOTAL) BY MOUTH  DAILY.  90 tablet  1  . losartan (COZAAR) 100 MG tablet Take 0.5 tablets (50 mg total) by mouth daily.  15 tablet  6  . Macitentan (OPSUMIT) 10 MG TABS Take 10 mg by mouth daily.  90 tablet  3  . mometasone-formoterol (DULERA) 200-5 MCG/ACT AERO Inhale 2 puffs into the lungs 2 (two) times daily.  13 g  6  . omeprazole (PRILOSEC) 40 MG capsule Take 1 capsule (40 mg total) by mouth daily.  90 capsule  1  . potassium chloride (K-DUR) 10 MEQ tablet Take 2 tablets (20 mEq total) by mouth daily.  180 tablet  3  . saccharomyces boulardii (FLORASTOR) 250 MG capsule Take 1 capsule (250 mg total) by mouth 2 (two) times daily.  60 capsule  1  . spironolactone (ALDACTONE) 50 MG tablet Take 1 tablet (50 mg total) by mouth daily.  30 tablet  11  . Tadalafil, PAH, (ADCIRCA) 20 MG TABS Take 2 tablets (40 mg total) by mouth daily.  180 tablet  3  . verapamil  (VERELAN PM) 180 MG 24 hr capsule Take 180 mg by mouth 2 (two) times daily.      Marland Kitchen zolpidem (AMBIEN) 10 MG tablet Take 1 tablet (10 mg total) by mouth at bedtime as needed for sleep.  30 tablet  2   No current facility-administered medications on file prior to visit.    Allergies  Allergen Reactions  . Aspirin Other (See Comments)    hypertension    Review of Systems  Review of Systems  Constitutional: Negative for fever and malaise/fatigue.  HENT: Negative for congestion.        Ears still feel clogged.   Eyes: Negative for discharge.  Respiratory: Negative for shortness of breath.   Cardiovascular: Negative for chest pain, palpitations and leg swelling.  Gastrointestinal: Negative for nausea, abdominal pain and diarrhea.  Genitourinary: Negative for dysuria.  Musculoskeletal: Negative for falls.  Skin: Negative for rash.  Neurological: Negative for loss of consciousness and headaches.  Endo/Heme/Allergies: Negative for polydipsia.  Psychiatric/Behavioral: Negative for depression and suicidal ideas. The patient is not nervous/anxious and does not have insomnia.     Objective  BP 120/78  Pulse 68  Temp(Src) 97.8 F (36.6 C) (Oral)  Ht _0  (1.854 m)  Wt 212 lb 1.3 oz (96.199 kg)  BMI 27.99 kg/m2  SpO2 95%  Physical Exam  Physical Exam  Constitutional: He is oriented to person, place, and time and well-developed, well-nourished, and in no distress. No distress.  HENT:  Head: Normocephalic and atraumatic.  Eyes: Conjunctivae are normal.  Neck: Neck supple. No thyromegaly present.  Cardiovascular: Normal rate, regular rhythm and normal heart sounds.   No murmur heard. Pulmonary/Chest: Effort normal and breath sounds normal. No respiratory distress.  Abdominal: He exhibits no distension and no mass. There is no tenderness.  Musculoskeletal: He exhibits no edema.  Neurological: He is alert and oriented to person, place, and time.  Skin: Skin is warm.  Psychiatric:  Memory, affect and judgment normal.    Lab Results  Component Value Date   TSH 3.556 10/18/2013   Lab Results  Component Value Date   WBC 3.9* 01/05/2014   HGB 13.1 01/05/2014   HCT 39.1 01/05/2014   MCV 100.3* 01/05/2014   PLT 76* 01/05/2014   Lab Results  Component Value Date   CREATININE 1.78* 01/05/2014   BUN 49* 01/05/2014   NA 136 01/05/2014   K 4.2 01/05/2014   CL 103  01/05/2014   CO2 20 01/05/2014   Lab Results  Component Value Date   ALT 31 09/26/2013   AST 40* 09/26/2013   ALKPHOS 150* 09/26/2013   BILITOT 0.5 09/26/2013   Lab Results  Component Value Date   CHOL 174 04/19/2013   Lab Results  Component Value Date   HDL 54 04/19/2013   Lab Results  Component Value Date   LDLCALC 65 04/19/2013   Lab Results  Component Value Date   TRIG 274* 04/19/2013   Lab Results  Component Value Date   CHOLHDL 3.2 04/19/2013     Assessment & Plan  Hypertension Well controlled, no changes  Diarrhea Encouraged probiotics, cultures were negative and his diarrhea was better right after colonoscopy but has returned, has been hesitant to take Welchol has follow up with gastroenterology soon.   Secondary pulmonary hypertension Stable and following with pulmonology

## 2014-01-08 NOTE — Assessment & Plan Note (Signed)
Well controlled, no changes 

## 2014-01-10 ENCOUNTER — Ambulatory Visit (HOSPITAL_COMMUNITY)
Admission: RE | Admit: 2014-01-10 | Discharge: 2014-01-10 | Disposition: A | Discharge status: Home / Self Care | Source: Ambulatory Visit | Attending: Internal Medicine | Admitting: Internal Medicine

## 2014-01-10 VITALS — BP 114/64 | HR 84 | Wt 213.2 lb

## 2014-01-10 DIAGNOSIS — K766 Portal hypertension: Principal | ICD-10-CM

## 2014-01-10 DIAGNOSIS — I2721 Secondary pulmonary arterial hypertension: Secondary | ICD-10-CM

## 2014-01-10 DIAGNOSIS — I2789 Other specified pulmonary heart diseases: Secondary | ICD-10-CM

## 2014-01-10 NOTE — Progress Notes (Signed)
6 min walk test performed, pt ambulated 1280 ft (390 meters), O2 sats ranged from 94-100% HR 84-109

## 2014-01-17 ENCOUNTER — Encounter: Payer: Self-pay | Admitting: Gastroenterology

## 2014-01-17 ENCOUNTER — Ambulatory Visit (INDEPENDENT_AMBULATORY_CARE_PROVIDER_SITE_OTHER): Admitting: Gastroenterology

## 2014-01-17 VITALS — BP 118/78 | HR 72 | Ht 71.0 in | Wt 213.4 lb

## 2014-01-17 DIAGNOSIS — R197 Diarrhea, unspecified: Secondary | ICD-10-CM

## 2014-01-17 MED ORDER — HYOSCYAMINE SULFATE ER 0.375 MG PO TB12: 0.3750 mg | ORAL_TABLET | Freq: Two times a day (BID) | ORAL | Status: DC

## 2014-01-17 NOTE — Patient Instructions (Addendum)
Recall colonoscopy 12/2018 for history of polyps. You can stop the florastor (probiotic). Continue taking lomotil and imodium. Start levbid (will help with spasms and can firm up your stools).  Call Dr. Ardis Hughs in 5-6 weeks to report on your response to this med.

## 2014-01-17 NOTE — Progress Notes (Signed)
Review of pertinent gastrointestinal problems:  1. history of adenomatous colon polyps : Colonoscopy 12/10: "Two sessile polyps were found in hepatic flexure (8-61m), piecemeal resected and then tatoo'd with INigerInk" path showed "moderate to severe dysplasia" and he was recommended to have repeat examination at 3 month interval. Colonoscopy December 2012 found 4 polyps, one was piecemeal removed, adenoma. He was recommended to have repeat at 6 month recall. With pulm HTN, active, dyspnea this was changed to 04/2014. Colonsocopy 12/2013 [facility] Posten" one small polyp, was not adenomatous.  Recall colonoscopy 12/2018 2. incidentally noted esophageal varices on 2012 EGD done for dysphasia. 2012 Abdominal ultrasound showed a normal liver without clear sign of cirrhosis. Was told to cut back on drinking.  3. 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 From chronic etoh over use. Labs 12/12 Hep A Ab +, Hep B S ag and Ab negative, Hep C Ab neg; ANA, AIA, AMA, ceruloplasm, iron testing all neg;  Last liver imaging: 12/12 [place] clear cirrhosis, no focal lesions  Last AFP: 12/2012 normal  Last EGD: 2012 small esophageal varices  Hep B immunization series started 11/2012  4. 2014 right heart angiogram suggested severe right heart failure, pulmonary hypertension started on macitentan 01/18/2013. Followed closely by CHF clinic who has assumed lead role in managing diuretics. 5. Diarrhea: Colonoscopy 12/2013; normal TI, no macroscopic colitis, no microscopic colitis on biopsies.  HPI: This is a  very pleasant 63year old man whom I last saw time of colonoscopy.  Colonoscopy last month.  Has loose stools after every meal.  Urgency usually.  No overt bleeding. He often has abdominal spasms prior to the loose stools.  Takes lomotil and imodium daily.      Past Medical History  Diagnosis Date  . Hypertension   . Seizures    . Hx of colonic polyps   . GERD (gastroesophageal reflux disease)   . Gout   . Alcoholic cirrhosis of liver with ascites 2014  . Unspecified hypothyroidism 01/25/2013  . Unspecified pleural effusion   . Other chronic pulmonary heart diseases   . Hypopotassemia   . Renal insufficiency 07/18/2013  . Diarrhea 09/04/2013  . Otalgia 11/28/2013    Past Surgical History  Procedure Laterality Date  . Colonoscopy N/A 12/08/2013    Procedure: COLONOSCOPY;  Surgeon: DMilus Banister MD;  Location: [facility];  Service: Endoscopy;  Laterality: N/A;    Current Outpatient Prescriptions  Medication Sig Dispense Refill  . allopurinol (ZYLOPRIM) 100 MG tablet Take 1 tablet (100 mg total) by mouth daily.  90 tablet  1  . Colesevelam HCl ([facility] 3.75 G PACK Take 1 packet by mouth daily. In 8 oz of fluids in evening  6 each  0  . diphenoxylate-atropine (LOMOTIL) 2.5-0.025 MG per tablet Take 1 tablet by mouth 2 (two) times daily.  60 tablet  3  . furosemide (LASIX) 40 MG tablet Take 20 mg by mouth daily. Only take for weight 208 pounds or greater      . levothyroxine (SYNTHROID, LEVOTHROID) 25 MCG tablet TAKE 1 TABLET (25 MCG TOTAL) BY MOUTH DAILY.  90 tablet  1  . losartan (COZAAR) 100 MG tablet Take 0.5 tablets (50 mg total) by mouth daily.  15 tablet  6  . Macitentan (OPSUMIT) 10 MG TABS Take 10 mg by mouth daily.  90 tablet  3  . mometasone-formoterol (DULERA) 200-5 MCG/ACT AERO Inhale  2 puffs into the lungs 2 (two) times daily.  13 g  6  . omeprazole (PRILOSEC) 40 MG capsule Take 1 capsule (40 mg total) by mouth daily.  90 capsule  1  . potassium chloride (K-DUR) 10 MEQ tablet Take 2 tablets (20 mEq total) by mouth daily.  180 tablet  3  . saccharomyces boulardii (FLORASTOR) 250 MG capsule Take 1 capsule (250 mg total) by mouth 2 (two) times daily.  60 capsule  1  . spironolactone (ALDACTONE) 50 MG tablet Take 1 tablet (50 mg total) by mouth daily.  30 tablet  11  . Tadalafil, PAH, (ADCIRCA) 20 MG  TABS Take 2 tablets (40 mg total) by mouth daily.  180 tablet  3  . triamcinolone cream (KENALOG) 0.1 % Apply 1 application topically 2 (two) times daily as needed. dermatitis  85.2 g  1  . verapamil (VERELAN PM) 180 MG 24 hr capsule Take 180 mg by mouth 2 (two) times daily.      Marland Kitchen zolpidem (AMBIEN) 10 MG tablet Take 1 tablet (10 mg total) by mouth at bedtime as needed for sleep.  30 tablet  2  . Doxepin HCl (SILENOR) 6 MG TABS Take 6 mg by mouth as needed.        No current facility-administered medications for this visit.    Allergies as of 01/17/2014 - Review Complete 01/17/2014  Allergen Reaction Noted  . Aspirin Other (See Comments) 03/21/2011    Family History  Problem Relation Age of Onset  . Heart disease Mother   . Prostate cancer Father   . Colon cancer Neg Hx   . Heart attack Mother   . Hypertension Mother     History   Social History  . Marital Status: Divorced    Spouse Name: N/A    Number of Children: 3  . Years of Education: N/A   Occupational History  . Retired     Social research officer, government   Social History Main Topics  . Smoking status: Former Smoker -- 2.00 packs/day for 5 years    Types: Cigarettes    Quit date: 09/22/1979  . Smokeless tobacco: Never Used  . Alcohol Use: Yes     Comment: 3-4 cans of beers and 4-5 drinks of vodka - has not drunk any alcohol in the last few weeks  . Drug Use: No  . Sexual Activity: Not on file   Other Topics Concern  . Not on file   Social History Narrative   0 caffeine drinks daily       Physical Exam: BP 118/78  Pulse 72  Ht 5' 11" (1.803 m)  Wt 213 lb 6 oz (96.786 kg)  BMI 29.77 kg/m2 Constitutional: generally well-appearing Psychiatric: alert and oriented x3 Abdomen: soft, nontender, nondistended, no obvious ascites, no peritoneal signs, normal bowel sounds     Assessment and plan: 63 y.o. male with chronic postprandial spasm and loose stools  I recommended he stop taking the probiotic medicine. It does not  seem to be helping. He is going to try and eat a spasm twice-daily medicine and we'll call to report on his symptoms in 6-7 weeks. After the colonoscopy and other workup is undergone I think it is unlikely that he has aching serious going on.

## 2014-01-20 ENCOUNTER — Telehealth: Payer: Self-pay | Admitting: Gastroenterology

## 2014-01-20 MED ORDER — SPIRONOLACTONE 50 MG PO TABS: 50.0000 mg | ORAL_TABLET | Freq: Every day | ORAL | Status: DC

## 2014-01-20 NOTE — Telephone Encounter (Signed)
Rx sent.

## 2014-01-25 ENCOUNTER — Ambulatory Visit (HOSPITAL_COMMUNITY)
Admission: RE | Admit: 2014-01-25 | Discharge: 2014-01-25 | Disposition: A | Discharge status: Home / Self Care | Source: Ambulatory Visit | Attending: Internal Medicine | Admitting: Internal Medicine

## 2014-01-25 ENCOUNTER — Encounter (HOSPITAL_COMMUNITY): Payer: Self-pay

## 2014-01-25 VITALS — BP 102/58 | Wt 219.8 lb

## 2014-01-25 DIAGNOSIS — I2721 Secondary pulmonary arterial hypertension: Secondary | ICD-10-CM

## 2014-01-25 DIAGNOSIS — K766 Portal hypertension: Secondary | ICD-10-CM | POA: Insufficient documentation

## 2014-01-25 DIAGNOSIS — I10 Essential (primary) hypertension: Secondary | ICD-10-CM | POA: Insufficient documentation

## 2014-01-25 DIAGNOSIS — I2789 Other specified pulmonary heart diseases: Secondary | ICD-10-CM

## 2014-01-25 MED ORDER — POTASSIUM CHLORIDE ER 10 MEQ PO TBCR: 20.0000 meq | EXTENDED_RELEASE_TABLET | Freq: Every day | ORAL | Status: DC

## 2014-01-25 MED ORDER — FUROSEMIDE 40 MG PO TABS: 40.0000 mg | ORAL_TABLET | ORAL | Status: DC

## 2014-01-25 NOTE — Addendum Note (Signed)
Encounter addended by: Scarlette Calico, RN on: 01/25/2014 11:29 AM<BR>     Documentation filed: Medications, Patient Instructions Section, Orders

## 2014-01-25 NOTE — Patient Instructions (Signed)
Stop Losartan  Take Furosemide 40 mg every Mon and Fri  Take extra 20 meq (2 tabs) on Mon and Fri  Your physician has requested that you have an echocardiogram. Echocardiography is a painless test that uses sound waves to create images of your heart. It provides your doctor with information about the size and shape of your heart and how well your heart's chambers and valves are working. This procedure takes approximately one hour. There are no restrictions for this procedure.  We will contact you in 3 months to schedule your next appointment.

## 2014-01-25 NOTE — Progress Notes (Signed)
Patient ID: Zecheriah Hillier, male   DOB: July 14, 1951, 63 y.o.   MRN: [id number]  GI : Dr Christella Hartigan PCP: DrBlyth  Subjective:    63 yo with history of COPD, portopulmonary hypertension with RV failure, and ETOH cirrhosis presents after syncopal episode. Over the past 2 weeks, he has had loose stool 2-3 times/day every day. He was started on cholestyramine by his PCP.    No prior history of syncope. Adcirca was increased to 40 mg daily back in 10/14 with no problems.   Admitted in December 2014 with syncope in the setting of persistent diarrhea. C.diff and O&P negative.  Creatinine was up to 2.7 from 1.7 baseline. He was hydrated. Losartan and lasix stopped. Told to hold lasix unless weight 208 or greater.   Event Monitor - NSR 10/26/13.   He returns for follow up. At last visit was struggling so and echo ordered. Echo still pending. stable at 1280 feet. Complaining of dyspnea with exertion - no change (NYHA III).  Has held lasix weight up from 213 to 219. No edema. Says he has to take his time going to the grocery store. Requires frequent breaks. Not weighing at home. Denies presyncope. Compliant with medications. On Adcirca and macitentan.   10/19/13 Creatinine 1.79 K 4.1  3/15         Cr 1.78 K 4.2  ECHO 12/14 EF 55-60% Peak PA pressure 35. Severe RV dysfunction   6 min walk 01/10/13, 1290 feet (01/10/14) = 1280 feet (380 m)  Objective:   Weight Range:  Vital Signs:   BP: (102)/(58) 102/58 mmHg (03/25 1030) Weight:  [219 lb 12.8 oz (99.701 kg)] 219 lb 12.8 oz (99.701 kg) (03/25 1030)    Weight change: Filed Weights   01/25/14 1030  Weight: 219 lb 12.8 oz (99.701 kg)    Intake/Output:  No intake or output data in the 24 hours ending 01/25/14 1110   Physical Exam: General: NAD  Neck: JVP ~6-7 , no thyromegaly or thyroid nodule.  Lungs: Clear to auscultation bilaterally with normal respiratory effort.  CV: Nondisplaced PMI. Heart regular  2/6 TR P2 LLSB. No carotid  bruit. Normal pedal pulses.  Abdomen: Soft, nontender, no hepatosplenomegaly, no distention.  Ext: warm 1+ edema. No clubbing Skin: Intact without lesions or rashes.  Neurologic: Alert and oriented x 3.  Psych: Normal affect.  Extremities: No clubbing or cyanosis.  HEENT: Normal.   Labs: Basic Metabolic Panel: No results found for this basename: NA, K, CL, CO2, GLUCOSE, BUN, CREATININE, CALCIUM, MG, PHOS,  in the last 168 hours  Liver Function Tests: No results found for this basename: AST, ALT, ALKPHOS, BILITOT, PROT, ALBUMIN,  in the last 168 hours No results found for this basename: LIPASE, AMYLASE,  in the last 168 hours No results found for this basename: AMMONIA,  in the last 168 hours  CBC: No results found for this basename: WBC, NEUTROABS, HGB, HCT, MCV, PLT,  in the last 168 hours  Cardiac Enzymes: No results found for this basename: CKTOTAL, CKMB, CKMBINDEX, TROPONINI,  in the last 168 hours  BNP: BNP (last 3 results)  Recent Labs  08/29/13 0936 10/18/13 0205  PROBNP 1262.0* 619.8*     Other results:  :   Imaging: No results found.   Medications:      Medication List    ASK your doctor about these medications       allopurinol 100 MG tablet  Commonly known as:  ZYLOPRIM  Take  1 tablet (100 mg total) by mouth daily.     Colesevelam HCl 3.75 G Pack  Commonly known as:  WELCHOL  Take 1 packet by mouth daily. In 8 oz of fluids in evening     diphenoxylate-atropine 2.5-0.025 MG per tablet  Commonly known as:  LOMOTIL  Take 1 tablet by mouth 2 (two) times daily.     furosemide 40 MG tablet  Commonly known as:  LASIX  Take 20 mg by mouth daily. Only take for weight 208 pounds or greater     hyoscyamine 0.375 MG 12 hr tablet  Commonly known as:  LEVBID  Take 1 tablet (0.375 mg total) by mouth 2 (two) times daily.     levothyroxine 25 MCG tablet  Commonly known as:  SYNTHROID, LEVOTHROID  TAKE 1 TABLET (25 MCG TOTAL) BY MOUTH DAILY.      losartan 100 MG tablet  Commonly known as:  COZAAR  Take 0.5 tablets (50 mg total) by mouth daily.     Macitentan 10 MG Tabs  Commonly known as:  OPSUMIT  Take 10 mg by mouth daily.     mometasone-formoterol 200-5 MCG/ACT Aero  Commonly known as:  DULERA  Inhale 2 puffs into the lungs 2 (two) times daily.     omeprazole 40 MG capsule  Commonly known as:  PRILOSEC  Take 1 capsule (40 mg total) by mouth daily.     potassium chloride 10 MEQ tablet  Commonly known as:  K-DUR  Take 2 tablets (20 mEq total) by mouth daily.     saccharomyces boulardii 250 MG capsule  Commonly known as:  FLORASTOR  Take 1 capsule (250 mg total) by mouth 2 (two) times daily.     SILENOR 6 MG Tabs  Generic drug:  Doxepin HCl  Take 6 mg by mouth as needed.     spironolactone 50 MG tablet  Commonly known as:  ALDACTONE  Take 1 tablet (50 mg total) by mouth daily.     Tadalafil (PAH) 20 MG Tabs  Commonly known as:  ADCIRCA  Take 2 tablets (40 mg total) by mouth daily.     triamcinolone cream 0.1 %  Commonly known as:  KENALOG  Apply 1 application topically 2 (two) times daily as needed. dermatitis     verapamil 180 MG 24 hr capsule  Commonly known as:  VERELAN PM  Take 180 mg by mouth 2 (two) times daily.     zolpidem 10 MG tablet  Commonly known as:  AMBIEN  Take 1 tablet (10 mg total) by mouth at bedtime as needed for sleep.        Assessment/Plan   1.. PAH - suspected portopulmonary HTN  Stable NYHA III on combination therapy. Volume status mildly elevated. Will restart lasix 40mg  on Monday and Friday only. Take with potassium 20 meqContinue spironolactone 50 mg dialy. Instructed to weigh and record daily.   Continue macitentan and adcirca 40 mg daily. Echo pending.  2. Chronic Renal Failure - reatinine baseline 1.7  3. ETOH cirrhosis - counseled on need to stop ETOH completely 4. HTN - BP low. Stop losartan.  Overall stable NYHA III. ok. Will continue current meds. If symptoms or  echo worse can consider adding inhaled agent. See back in 3 months.    Dalyn Becker,MD 11:10 AM

## 2014-02-02 ENCOUNTER — Telehealth (HOSPITAL_COMMUNITY): Payer: Self-pay | Admitting: *Deleted

## 2014-02-02 NOTE — Telephone Encounter (Signed)
Pt stopped by clinic today to ask if it is ok for him to fly to [facility], stay a few days then return, was able to discuss w/Dr Bemsimhon via phone, he states this is ok for pt to do, pt aware

## 2014-02-09 ENCOUNTER — Ambulatory Visit (HOSPITAL_COMMUNITY)
Admission: RE | Admit: 2014-02-09 | Discharge: 2014-02-09 | Disposition: A | Discharge status: Home / Self Care | Source: Ambulatory Visit | Attending: Internal Medicine | Admitting: Internal Medicine

## 2014-02-09 DIAGNOSIS — I2721 Secondary pulmonary arterial hypertension: Secondary | ICD-10-CM

## 2014-02-09 DIAGNOSIS — I270 Primary pulmonary hypertension: Secondary | ICD-10-CM | POA: Insufficient documentation

## 2014-02-09 DIAGNOSIS — I359 Nonrheumatic aortic valve disorder, unspecified: Secondary | ICD-10-CM

## 2014-02-09 DIAGNOSIS — K766 Portal hypertension: Secondary | ICD-10-CM

## 2014-02-09 NOTE — Progress Notes (Signed)
  Echocardiogram 2D Echocardiogram has been performed.  [place] 02/09/2014, 11:46 AM

## 2014-02-18 ENCOUNTER — Other Ambulatory Visit: Payer: Self-pay | Admitting: Family Medicine

## 2014-02-24 ENCOUNTER — Telehealth (HOSPITAL_COMMUNITY): Payer: Self-pay | Admitting: *Deleted

## 2014-02-24 NOTE — Telephone Encounter (Signed)
Pt given echo results

## 2014-03-17 ENCOUNTER — Encounter: Payer: Self-pay | Admitting: Family Medicine

## 2014-03-17 ENCOUNTER — Ambulatory Visit (INDEPENDENT_AMBULATORY_CARE_PROVIDER_SITE_OTHER): Admitting: Family Medicine

## 2014-03-17 ENCOUNTER — Other Ambulatory Visit: Payer: Self-pay | Admitting: Family Medicine

## 2014-03-17 VITALS — BP 132/82 | HR 89 | Temp 97.8°F | Ht 73.0 in | Wt 217.0 lb

## 2014-03-17 DIAGNOSIS — H60399 Other infective otitis externa, unspecified ear: Secondary | ICD-10-CM

## 2014-03-17 DIAGNOSIS — K219 Gastro-esophageal reflux disease without esophagitis: Secondary | ICD-10-CM

## 2014-03-17 DIAGNOSIS — R197 Diarrhea, unspecified: Secondary | ICD-10-CM

## 2014-03-17 DIAGNOSIS — I10 Essential (primary) hypertension: Secondary | ICD-10-CM

## 2014-03-17 DIAGNOSIS — E039 Hypothyroidism, unspecified: Secondary | ICD-10-CM

## 2014-03-17 DIAGNOSIS — M109 Gout, unspecified: Secondary | ICD-10-CM

## 2014-03-17 DIAGNOSIS — E785 Hyperlipidemia, unspecified: Secondary | ICD-10-CM

## 2014-03-17 LAB — LIPID PANEL
Cholesterol: 196 mg/dL (ref 0–200)
HDL: 65 mg/dL (ref >39)
LDL CALC: 105 mg/dL — AB (ref 0–99)
Total CHOL/HDL Ratio: 3 Ratio
Triglycerides: 128 mg/dL (ref <150)
VLDL: 26 mg/dL (ref 0–40)

## 2014-03-17 LAB — HEPATIC FUNCTION PANEL
ALT: 21 U/L (ref 0–53)
AST: 45 U/L — ABNORMAL HIGH (ref 0–37)
Albumin: 4.3 g/dL (ref 3.5–5.2)
Alkaline Phosphatase: 140 U/L — ABNORMAL HIGH (ref 39–117)
BILIRUBIN INDIRECT: 0.9 mg/dL (ref 0.2–1.2)
Bilirubin, Direct: 0.4 mg/dL — ABNORMAL HIGH (ref 0.0–0.3)
TOTAL PROTEIN: 7.3 g/dL (ref 6.0–8.3)
Total Bilirubin: 1.3 mg/dL — ABNORMAL HIGH (ref 0.2–1.2)

## 2014-03-17 LAB — RENAL FUNCTION PANEL
Albumin: 4.3 g/dL (ref 3.5–5.2)
BUN: 18 mg/dL (ref 6–23)
CALCIUM: 9.3 mg/dL (ref 8.4–10.5)
CO2: 23 mEq/L (ref 19–32)
Chloride: 106 mEq/L (ref 96–112)
Creat: 1.23 mg/dL (ref 0.50–1.35)
Glucose, Bld: 91 mg/dL (ref 70–99)
PHOSPHORUS: 2.9 mg/dL (ref 2.3–4.6)
POTASSIUM: 4 meq/L (ref 3.5–5.3)
Sodium: 141 mEq/L (ref 135–145)

## 2014-03-17 LAB — CBC
HCT: 37.8 % — ABNORMAL LOW (ref 39.0–52.0)
Hemoglobin: 13.1 g/dL (ref 13.0–17.0)
MCH: 34.1 pg — ABNORMAL HIGH (ref 26.0–34.0)
MCHC: 34.7 g/dL (ref 30.0–36.0)
MCV: 98.4 fL (ref 78.0–100.0)
Platelets: 65 10*3/uL — ABNORMAL LOW (ref 150–400)
RBC: 3.84 MIL/uL — ABNORMAL LOW (ref 4.22–5.81)
RDW: 15.4 % (ref 11.5–15.5)
WBC: 3 10*3/uL — AB (ref 4.0–10.5)

## 2014-03-17 LAB — URIC ACID: URIC ACID, SERUM: 5.5 mg/dL (ref 4.0–7.8)

## 2014-03-17 LAB — TSH: TSH: 3.25 u[IU]/mL (ref 0.350–4.500)

## 2014-03-17 LAB — LIPASE: LIPASE: 15 U/L (ref 0–75)

## 2014-03-17 LAB — AMYLASE: Amylase: 99 U/L (ref 0–105)

## 2014-03-17 MED ORDER — NEOMYCIN-POLYMYXIN-HC 3.5-10000-1 OT SOLN: 3.0000 [drp] | Freq: Three times a day (TID) | OTIC | Status: DC

## 2014-03-17 MED ORDER — SPIRONOLACTONE 50 MG PO TABS: 50.0000 mg | ORAL_TABLET | Freq: Every day | ORAL | Status: DC

## 2014-03-17 NOTE — Progress Notes (Signed)
Pre visit review using our clinic review tool, if applicable. No additional management support is needed unless otherwise documented below in the visit note.

## 2014-03-17 NOTE — Patient Instructions (Signed)
BRAT diet (bananas, rice, applesauce, toast)   Diet for Diarrhea, Adult Frequent, runny stools (diarrhea) may be caused or worsened by food or drink. Diarrhea may be relieved by changing your diet. Since diarrhea can last up to 7 days, it is easy for you to lose too much fluid from the body and become dehydrated. Fluids that are lost need to be replaced. Along with a modified diet, make sure you drink enough fluids to keep your urine clear or pale yellow. DIET INSTRUCTIONS  Ensure adequate fluid intake (hydration): have 1 cup (8 oz) of fluid for each diarrhea episode. Avoid fluids that contain simple sugars or sports drinks, fruit juices, whole milk products, and sodas. Your urine should be clear or pale yellow if you are drinking enough fluids. Hydrate with an oral rehydration solution that you can purchase at pharmacies, retail stores, and online. You can prepare an oral rehydration solution at home by mixing the following ingredients together:    tsp table salt.   tsp baking soda.   tsp salt substitute containing potassium chloride.  1  tablespoons sugar.  1 L (34 oz) of water.  Certain foods and beverages may increase the speed at which food moves through the gastrointestinal (GI) tract. These foods and beverages should be avoided and include:  Caffeinated and alcoholic beverages.  High-fiber foods, such as raw fruits and vegetables, nuts, seeds, and whole grain breads and cereals.  Foods and beverages sweetened with sugar alcohols, such as xylitol, sorbitol, and mannitol.  Some foods may be well tolerated and may help thicken stool including:  Starchy foods, such as rice, toast, pasta, low-sugar cereal, oatmeal, grits, baked potatoes, crackers, and bagels.   Bananas.   Applesauce.  Add probiotic-rich foods to help increase healthy bacteria in the GI tract, such as yogurt and fermented milk products. RECOMMENDED FOODS AND BEVERAGES Starches Choose foods with less than 2 g  of fiber per serving.  Recommended:  White, [place], and pita breads, plain rolls, buns, bagels. Plain muffins, matzo. Soda, saltine, or graham crackers. Pretzels, melba toast, zwieback. Cooked cereals made with water: cornmeal, farina, cream cereals. Dry cereals: refined corn, wheat, rice. Potatoes prepared any way without skins, refined macaroni, spaghetti, noodles, refined rice.  Avoid:  Bread, rolls, or crackers made with whole wheat, multi-grains, rye, bran seeds, nuts, or coconut. Corn tortillas or taco shells. Cereals containing whole grains, multi-grains, bran, coconut, nuts, raisins. Cooked or dry oatmeal. Coarse wheat cereals, granola. Cereals advertised as "high-fiber." Potato skins. Whole grain pasta, wild or brown rice. Popcorn. Sweet potatoes, yams. Sweet rolls, doughnuts, waffles, pancakes, sweet breads. Vegetables  Recommended: Strained tomato and vegetable juices. Most well-cooked and canned vegetables without seeds. Fresh: Tender lettuce, cucumber without the skin, cabbage, spinach, bean sprouts.  Avoid: Fresh, cooked, or canned: Artichokes, baked beans, beet greens, broccoli, Brussels sprouts, corn, kale, legumes, peas, sweet potatoes. Cooked: Green or red cabbage, spinach. Avoid large servings of any vegetables because vegetables shrink when cooked, and they contain more fiber per serving than fresh vegetables. Fruit  Recommended: Cooked or canned: Apricots, applesauce, cantaloupe, cherries, fruit cocktail, grapefruit, grapes, kiwi, mandarin oranges, peaches, pears, plums, watermelon. Fresh: Apples without skin, ripe banana, grapes, cantaloupe, cherries, grapefruit, peaches, oranges, plums. Keep servings limited to  cup or 1 piece.  Avoid: Fresh: Apples with skin, apricots, mangoes, pears, raspberries, strawberries. Prune juice, stewed or dried prunes. Dried fruits, raisins, dates. Large servings of all fresh fruits. Protein  Recommended: Ground or well-cooked tender beef, ham,  veal,  lamb, pork, or poultry. Eggs. Fish, oysters, shrimp, lobster, other seafoods. Liver, organ meats.  Avoid: Tough, fibrous meats with gristle. Peanut butter, smooth or chunky. Cheese, nuts, seeds, legumes, dried peas, beans, lentils. Dairy  Recommended: Yogurt, lactose-free milk, kefir, drinkable yogurt, buttermilk, soy milk, or plain hard cheese.  Avoid: Milk, chocolate milk, beverages made with milk, such as milkshakes. Soups  Recommended: Bouillon, broth, or soups made from allowed foods. Any strained soup.  Avoid: Soups made from vegetables that are not allowed, cream or milk-based soups. Desserts and Sweets  Recommended: Sugar-free gelatin, sugar-free frozen ice pops made without sugar alcohol.  Avoid: Plain cakes and cookies, pie made with fruit, pudding, custard, cream pie. Gelatin, fruit, ice, sherbet, frozen ice pops. Ice cream, ice milk without nuts. Plain hard candy, honey, jelly, molasses, syrup, sugar, chocolate syrup, gumdrops, marshmallows. Fats and Oils  Recommended: Limit fats to less than 8 tsp per day.  Avoid: Seeds, nuts, olives, avocados. Margarine, butter, cream, mayonnaise, salad oils, plain salad dressings. Plain gravy, crisp bacon without rind. Beverages  Recommended: Water, decaffeinated teas, oral rehydration solutions, sugar-free beverages not sweetened with sugar alcohols.  Avoid: Fruit juices, caffeinated beverages (coffee, tea, soda), alcohol, sports drinks, or lemon-lime soda. Condiments  Recommended: Ketchup, mustard, horseradish, vinegar, cocoa powder. Spices in moderation: allspice, basil, bay leaves, celery powder or leaves, cinnamon, cumin powder, curry powder, ginger, mace, marjoram, onion or garlic powder, oregano, paprika, parsley flakes, ground pepper, rosemary, sage, savory, tarragon, thyme, turmeric.  Avoid: Coconut, honey. Document Released: 01/10/2004 Document Revised: 07/14/2012 Document Reviewed: 03/05/2012 [facility] Patient  Information 2014 [place].

## 2014-03-19 ENCOUNTER — Encounter: Payer: Self-pay | Admitting: Family Medicine

## 2014-03-19 DIAGNOSIS — H60399 Other infective otitis externa, unspecified ear: Secondary | ICD-10-CM | POA: Insufficient documentation

## 2014-03-19 NOTE — Assessment & Plan Note (Signed)
Was greatly improved until his stress increased recently. He is struggling with his father's poor health and heavy drinking. May continue to use Lomotil prn. Consider a fiber supplement twice daily if continues.

## 2014-03-19 NOTE — Assessment & Plan Note (Signed)
Well controlled, no changes to meds. Encouraged heart healthy diet such as the DASH diet and exercise as tolerated.

## 2014-03-19 NOTE — Assessment & Plan Note (Signed)
Avoid offending foods, start probiotics. Do not eat large meals in late evening and consider raising head of bed.  

## 2014-03-19 NOTE — Progress Notes (Signed)
Patient ID: Darren Mueller, male   DOB: 12/17/1950, 63 y.o.   MRN: [id number] Darren Mueller [id number] [birth year] 03/19/2014      Progress Note-Follow Up  Subjective  Chief Complaint  Chief Complaint  Patient presents with  . Follow-up    10 week    HPI  Patient is a 63 year old male in today for routine medical care. Is struggling with diarrhea again. It had been greatly improved until his stress went up this past week due to his father's illness. Otherwise he feels well. No increased abdominal pain or fevers. No nausea vomiting. Denies CP/palp/SOB/HA/congestion/fevers/ GU c/o. Taking meds as prescribed  Past Medical History  Diagnosis Date  . Hypertension   . Seizures   . Hx of colonic polyps   . GERD (gastroesophageal reflux disease)   . Gout   . Alcoholic cirrhosis of liver with ascites 2014  . Unspecified hypothyroidism 01/25/2013  . Unspecified pleural effusion   . Other chronic pulmonary heart diseases   . Hypopotassemia   . Renal insufficiency 07/18/2013  . Diarrhea 09/04/2013  . Otalgia 11/28/2013    Past Surgical History  Procedure Laterality Date  . Colonoscopy N/A 12/08/2013    Procedure: COLONOSCOPY;  Surgeon: Milus Banister, MD;  Location: [facility];  Service: Endoscopy;  Laterality: N/A;    Family History  Problem Relation Age of Onset  . Heart disease Mother   . Prostate cancer Father   . Colon cancer Neg Hx   . Heart attack Mother   . Hypertension Mother     History   Social History  . Marital Status: Divorced    Spouse Name: N/A    Number of Children: 3  . Years of Education: N/A   Occupational History  . Retired     Social research officer, government   Social History Main Topics  . Smoking status: Former Smoker -- 2.00 packs/day for 5 years    Types: Cigarettes    Quit date: 09/22/1979  . Smokeless tobacco: Never Used  . Alcohol Use: Yes     Comment: 3-4 cans of beers and 4-5 drinks of vodka - has not drunk any alcohol in the last few weeks  . Drug Use:  No  . Sexual Activity: Not on file   Other Topics Concern  . Not on file   Social History Narrative   0 caffeine drinks daily     Current Outpatient Prescriptions on File Prior to Visit  Medication Sig Dispense Refill  . allopurinol (ZYLOPRIM) 100 MG tablet Take 1 tablet (100 mg total) by mouth daily.  90 tablet  1  . diphenoxylate-atropine (LOMOTIL) 2.5-0.025 MG per tablet Take 1 tablet by mouth 2 (two) times daily.  60 tablet  3  . Doxepin HCl (SILENOR) 6 MG TABS Take 6 mg by mouth as needed.       . furosemide (LASIX) 40 MG tablet Take 1 tablet (40 mg total) by mouth as directed. Every Mon and Fri  30 tablet    . hyoscyamine (LEVBID) 0.375 MG 12 hr tablet Take 1 tablet (0.375 mg total) by mouth 2 (two) times daily.  60 tablet  11  . levothyroxine (SYNTHROID, LEVOTHROID) 25 MCG tablet TAKE 1 TABLET (25 MCG TOTAL) BY MOUTH DAILY.  90 tablet  1  . Macitentan (OPSUMIT) 10 MG TABS Take 10 mg by mouth daily.  90 tablet  3  . mometasone-formoterol (DULERA) 200-5 MCG/ACT AERO Inhale 2 puffs into the lungs 2 (two) times daily.  Octa  g  6  . omeprazole (PRILOSEC) 40 MG capsule TAKE 1 CAPSULE (40 MG TOTAL) BY MOUTH DAILY.  90 capsule  0  . potassium chloride (K-DUR) 10 MEQ tablet Take 2 tablets (20 mEq total) by mouth daily. Take extra 2 tabs on Mon and Fri  90 tablet  3  . saccharomyces boulardii (FLORASTOR) 250 MG capsule Take 1 capsule (250 mg total) by mouth 2 (two) times daily.  60 capsule  1  . Tadalafil, PAH, (ADCIRCA) 20 MG TABS Take 2 tablets (40 mg total) by mouth daily.  180 tablet  3  . triamcinolone cream (KENALOG) 0.1 % Apply 1 application topically 2 (two) times daily as needed. dermatitis  85.2 g  1  . verapamil (VERELAN PM) 180 MG 24 hr capsule Take 180 mg by mouth 2 (two) times daily.      Marland Kitchen zolpidem (AMBIEN) 10 MG tablet Take 1 tablet (10 mg total) by mouth at bedtime as needed for sleep.  30 tablet  2   No current facility-administered medications on file prior to visit.     Allergies  Allergen Reactions  . Aspirin Other (See Comments)    hypertension    Review of Systems  Review of Systems  Constitutional: Negative for fever and malaise/fatigue.  HENT: Negative for congestion.   Eyes: Negative for discharge.  Respiratory: Negative for shortness of breath.   Cardiovascular: Negative for chest pain, palpitations and leg swelling.  Gastrointestinal: Positive for diarrhea. Negative for nausea and abdominal pain.  Genitourinary: Negative for dysuria.  Musculoskeletal: Negative for falls.  Skin: Negative for rash.  Neurological: Negative for loss of consciousness and headaches.  Endo/Heme/Allergies: Negative for polydipsia.  Psychiatric/Behavioral: Negative for depression and suicidal ideas. The patient is nervous/anxious. The patient does not have insomnia.     Objective  BP 132/82  Pulse 89  Temp(Src) 97.8 F (36.6 C) (Oral)  Ht _0  (1.854 m)  Wt 217 lb (98.431 kg)  BMI 28.64 kg/m2  SpO2 96%  Physical Exam  Physical Exam  Constitutional: He is oriented to person, place, and time and well-developed, well-nourished, and in no distress. No distress.  HENT:  Head: Normocephalic and atraumatic.  Eyes: Conjunctivae are normal.  Neck: Neck supple. No thyromegaly present.  Cardiovascular: Normal rate, regular rhythm and normal heart sounds.   Pulmonary/Chest: Effort normal and breath sounds normal. No respiratory distress.  Abdominal: He exhibits no distension and no mass. There is no tenderness.  Musculoskeletal: He exhibits no edema.  Neurological: He is alert and oriented to person, place, and time.  Skin: Skin is warm.  Psychiatric: Memory, affect and judgment normal.    Lab Results  Component Value Date   TSH 3.250 03/17/2014   Lab Results  Component Value Date   WBC 3.0* 03/17/2014   HGB 13.1 03/17/2014   HCT 37.8* 03/17/2014   MCV 98.4 03/17/2014   PLT 65* 03/17/2014   Lab Results  Component Value Date   CREATININE 1.23  03/17/2014   BUN 18 03/17/2014   NA 141 03/17/2014   K 4.0 03/17/2014   CL 106 03/17/2014   CO2 23 03/17/2014   Lab Results  Component Value Date   ALT 21 03/17/2014   AST 45* 03/17/2014   ALKPHOS 140* 03/17/2014   BILITOT 1.3* 03/17/2014   Lab Results  Component Value Date   CHOL 196 03/17/2014   Lab Results  Component Value Date   HDL 65 03/17/2014   Lab Results  Component Value Date  [place] 105* 03/17/2014   Lab Results  Component Value Date   TRIG 128 03/17/2014   Lab Results  Component Value Date   CHOLHDL 3.0 03/17/2014     Assessment & Plan  Diarrhea Was greatly improved until his stress increased recently. He is struggling with his father's poor health and heavy drinking. May continue to use Lomotil prn. Consider a fiber supplement twice daily if continues.  Hypertension Well controlled, no changes to meds. Encouraged heart healthy diet such as the DASH diet and exercise as tolerated.   GERD (gastroesophageal reflux disease) Avoid offending foods, start probiotics. Do not eat large meals in late evening and consider raising head of bed.   Unspecified hypothyroidism On Levothyroxine, continue to monitor  Otitis, externa, infective Cortisporin otic is prescribed

## 2014-03-19 NOTE — Assessment & Plan Note (Signed)
On Levothyroxine, continue to monitor

## 2014-03-19 NOTE — Assessment & Plan Note (Signed)
Cortisporin otic is prescribed

## 2014-03-23 ENCOUNTER — Other Ambulatory Visit (HOSPITAL_COMMUNITY): Payer: Self-pay

## 2014-03-23 DIAGNOSIS — I2789 Other specified pulmonary heart diseases: Secondary | ICD-10-CM

## 2014-03-23 MED ORDER — MACITENTAN 10 MG PO TABS: 10.0000 mg | ORAL_TABLET | Freq: Every day | ORAL | Status: DC

## 2014-03-24 ENCOUNTER — Other Ambulatory Visit: Payer: Self-pay

## 2014-03-24 DIAGNOSIS — L309 Dermatitis, unspecified: Secondary | ICD-10-CM

## 2014-03-24 DIAGNOSIS — I10 Essential (primary) hypertension: Secondary | ICD-10-CM

## 2014-03-24 MED ORDER — SPIRONOLACTONE 50 MG PO TABS: 50.0000 mg | ORAL_TABLET | Freq: Every day | ORAL | Status: DC

## 2014-03-24 MED ORDER — DOXEPIN HCL 6 MG PO TABS: 6.0000 mg | ORAL_TABLET | ORAL | Status: DC | PRN

## 2014-03-24 MED ORDER — TRIAMCINOLONE ACETONIDE 0.1 % EX CREA: 1.0000 "application " | TOPICAL_CREAM | Freq: Two times a day (BID) | CUTANEOUS | Status: DC | PRN

## 2014-03-24 MED ORDER — HYOSCYAMINE SULFATE ER 0.375 MG PO TB12: 0.3750 mg | ORAL_TABLET | Freq: Two times a day (BID) | ORAL | Status: DC

## 2014-03-24 NOTE — Telephone Encounter (Signed)
Spoke with pt, advised I received refill requests from [facility]. He states he will be using this mail order pharmacy now. Rx;s sent.

## 2014-03-29 ENCOUNTER — Other Ambulatory Visit (HOSPITAL_COMMUNITY): Payer: Self-pay | Admitting: Cardiology

## 2014-03-30 ENCOUNTER — Telehealth: Payer: Self-pay | Admitting: Family Medicine

## 2014-03-30 MED ORDER — HYOSCYAMINE SULFATE ER 0.375 MG PO TB12: 0.3750 mg | ORAL_TABLET | Freq: Two times a day (BID) | ORAL | Status: DC

## 2014-03-30 NOTE — Telephone Encounter (Signed)
Please advise? I don't see this on med list?

## 2014-03-30 NOTE — Telephone Encounter (Signed)
That is the brand name of hysocyamine but you are right it is not in his chart so I need to know what strength and how he takes it.

## 2014-03-30 NOTE — Telephone Encounter (Signed)
Refill-oscimin  [facility]

## 2014-03-30 NOTE — Telephone Encounter (Signed)
Hycosamine is on med list. rx sent

## 2014-04-07 ENCOUNTER — Telehealth: Payer: Self-pay

## 2014-04-07 MED ORDER — HYOSCYAMINE SULFATE ER 0.375 MG PO TB12: 0.3750 mg | ORAL_TABLET | Freq: Two times a day (BID) | ORAL | Status: DC

## 2014-04-07 NOTE — Telephone Encounter (Signed)
Insurance will not cover Levbid ER but will cover Symax SR tabs  Will send in new RX per MD

## 2014-04-14 ENCOUNTER — Encounter (HOSPITAL_COMMUNITY): Payer: Self-pay

## 2014-05-04 ENCOUNTER — Other Ambulatory Visit: Payer: Self-pay | Admitting: Family Medicine

## 2014-05-05 ENCOUNTER — Other Ambulatory Visit: Payer: Self-pay | Admitting: Family Medicine

## 2014-05-18 ENCOUNTER — Other Ambulatory Visit: Payer: Self-pay | Admitting: Family Medicine

## 2014-05-24 ENCOUNTER — Other Ambulatory Visit (HOSPITAL_COMMUNITY): Payer: Self-pay | Admitting: Cardiology

## 2014-05-24 DIAGNOSIS — I2789 Other specified pulmonary heart diseases: Secondary | ICD-10-CM

## 2014-05-29 ENCOUNTER — Encounter (HOSPITAL_COMMUNITY): Payer: Self-pay

## 2014-05-29 ENCOUNTER — Ambulatory Visit (HOSPITAL_COMMUNITY)
Admission: RE | Admit: 2014-05-29 | Discharge: 2014-05-29 | Disposition: A | Discharge status: Home / Self Care | Source: Ambulatory Visit | Attending: Family Medicine | Admitting: Family Medicine

## 2014-05-29 ENCOUNTER — Encounter (HOSPITAL_COMMUNITY): Payer: Self-pay | Admitting: Cardiology

## 2014-05-29 ENCOUNTER — Ambulatory Visit (HOSPITAL_BASED_OUTPATIENT_CLINIC_OR_DEPARTMENT_OTHER)
Admission: RE | Admit: 2014-05-29 | Discharge: 2014-05-29 | Disposition: A | Discharge status: Home / Self Care | Source: Ambulatory Visit | Attending: Internal Medicine | Admitting: Internal Medicine

## 2014-05-29 VITALS — BP 116/82 | HR 98 | Wt 216.8 lb

## 2014-05-29 DIAGNOSIS — I359 Nonrheumatic aortic valve disorder, unspecified: Secondary | ICD-10-CM

## 2014-05-29 DIAGNOSIS — I2789 Other specified pulmonary heart diseases: Secondary | ICD-10-CM | POA: Insufficient documentation

## 2014-05-29 DIAGNOSIS — I2721 Secondary pulmonary arterial hypertension: Secondary | ICD-10-CM

## 2014-05-29 DIAGNOSIS — K766 Portal hypertension: Principal | ICD-10-CM

## 2014-05-29 MED ORDER — LOSARTAN POTASSIUM 100 MG PO TABS: 50.0000 mg | ORAL_TABLET | Freq: Every day | ORAL | Status: DC

## 2014-05-29 NOTE — Progress Notes (Signed)
Patient ID: Darren Mueller, male   DOB: 03-17-1951, 63 y.o.   MRN: [id number]  GI : Dr Ardis Hughs PCP: DrBlyth  Subjective:    63 yo with history of COPD, portopulmonary hypertension with RV failure, and ETOH cirrhosis presents after syncopal episode. Over the past 2 weeks, he has had loose stool 2-3 times/day every day. He was started on cholestyramine by his PCP.   [place] 2/14:  RA = 13  RV = 63/5/14  PA = 65/24 (41)  PCW = 6  Hepatic wedge = 21  Fick cardiac output/index = 3.8/1.8  PVR = 9.1  FA sat = 94%  PA sat = 58%, 56%  SVC = 54%  PFTs 3/14 FEV1 2.02 L (58%) FVC 2.7L (54%) FEV1/FVC (89%) DLCO 53%  Admitted in December 2014 with syncope in the setting of persistent diarrhea. C.diff and O&P negative.  Creatinine was up to 2.7 from 1.7 baseline. He was hydrated. Losartan and lasix stopped. Told to hold lasix unless weight 208 or greater. Event Monitor - NSR 10/26/13.   ECHO 12/14 EF 55-60% Peak PA pressure 35. Severe RV dysfunction  ECHO 7/15 EF 60% RV moderately to severely dilated. Moderate HK RVSP 44m HG   6 min walk 01/10/13, 1290 feet 6MW (01/10/14) = 1280 feet (380 m)  He returns for follow up. At last visit losartan stopped due to hypotension but somehow got restarted at 535mdaily.  Says he is doing OK. Complaining of dyspnea with exertion - no change (NYHA III) - has to stop when walking around store then restarts.  Weight stable around 215 pounds.  No edema. Denies presyncope. Compliant with medications. On Adcirca and macitentan. Has not quit ETOH - drinks 3 vodkas/2x per week    10/19/13 Creatinine 1.79 K 4.1  3/15         Cr 1.78 K 4.2 03/17/14    CR 1.23 K 4.0 LFTs ok  Current Outpatient Prescriptions on File Prior to Encounter  Medication Sig Dispense Refill  . allopurinol (ZYLOPRIM) 100 MG tablet TAKE 1 TABLET DAILY  90 tablet  0  . diphenoxylate-atropine (LOMOTIL) 2.5-0.025 MG per tablet Take 1 tablet by mouth 2 (two) times daily.  60 tablet  3  . Doxepin HCl  (SILENOR) 6 MG TABS Take 1 tablet (6 mg total) by mouth as needed.  90 tablet  1  . furosemide (LASIX) 40 MG tablet Take 1 tablet (40 mg total) by mouth as directed. Every Mon and Fri  30 tablet    . levothyroxine (SYNTHROID, LEVOTHROID) 25 MCG tablet TAKE 1 TABLET DAILY  90 tablet  0  . loperamide (ANTI-DIARRHEAL) 2 MG capsule Take by mouth as needed for diarrhea or loose stools.      . Marland Kitchenosartan (COZAAR) 100 MG tablet Take 50 mg by mouth daily.       . Macitentan (OPSUMIT) 10 MG TABS Take 10 mg by mouth daily.  90 tablet  3  . mometasone-formoterol (DULERA) 200-5 MCG/ACT AERO Inhale 2 puffs into the lungs 2 (two) times daily.  13 g  6  . neomycin-polymyxin-hydrocortisone (CORTISPORIN) otic solution Place 3 drops into both ears 3 (three) times daily.  10 mL  1  . omeprazole (PRILOSEC) 40 MG capsule TAKE 1 CAPSULE DAILY  90 capsule  0  . potassium chloride (K-DUR) 10 MEQ tablet Take 2 tablets (20 mEq total) by mouth daily. Take extra 2 tabs on Mon and Fri  90 tablet  3  . saccharomyces boulardii (FLORASTOR) 250  MG capsule Take 1 capsule (250 mg total) by mouth 2 (two) times daily.  60 capsule  1  . spironolactone (ALDACTONE) 50 MG tablet Take 1 tablet (50 mg total) by mouth daily.  90 tablet  1  . Tadalafil, PAH, (ADCIRCA) 20 MG TABS Take 2 tablets (40 mg total) by mouth daily.  180 tablet  3  . triamcinolone cream (KENALOG) 0.1 % Apply 1 application topically 2 (two) times daily as needed. Dermatitis, 90 day supply, 3 tubes  85.2 g  1  . verapamil (VERELAN PM) 180 MG 24 hr capsule Take 180 mg by mouth 2 (two) times daily.      . VERELAN 180 MG 24 hr capsule TAKE 1 CAPSULE AT BEDTIME  90 capsule  0  . zolpidem (AMBIEN) 10 MG tablet Take 1 tablet (10 mg total) by mouth at bedtime as needed for sleep.  30 tablet  2  . hyoscyamine (SYMAX-SR) 0.375 MG 12 hr tablet Take 1 tablet (0.375 mg total) by mouth 2 (two) times daily.  180 tablet  0   No current facility-administered medications on file prior to  encounter.     Objective:   Weight Range:  Vital Signs:   Pulse Rate:  [98] 98 (07/27 1408) BP: (116)/(82) 116/82 mmHg (07/27 1408) SpO2:  [93 %] 93 % (07/27 1408) Weight:  [216 lb 12.8 oz (98.34 kg)] 216 lb 12.8 oz (98.34 kg) (07/27 1408)    Weight change: Filed Weights   05/29/14 1408  Weight: 216 lb 12.8 oz (98.34 kg)    Intake/Output:  No intake or output data in the 24 hours ending 05/29/14 1430   Physical Exam: General: NAD  Neck: JVP ~6-7 , no thyromegaly or thyroid nodule.  Lungs: Clear to auscultation bilaterally with normal respiratory effort.  CV: Nondisplaced PMI. Heart regular  2/6 TR P2 LLSB. No carotid bruit. Normal pedal pulses.  Abdomen: Soft, nontender, no hepatosplenomegaly, no distention.  Ext: warm No edema. No clubbing Skin: Intact without lesions or rashes.  Neurologic: Alert and oriented x 3.  Psych: Normal affect.  Extremities: No clubbing or cyanosis.  HEENT: Normal.   Labs: Basic Metabolic Panel: No results found for this basename: NA, K, CL, CO2, GLUCOSE, BUN, CREATININE, CALCIUM, MG, PHOS,  in the last 168 hours  Liver Function Tests: No results found for this basename: AST, ALT, ALKPHOS, BILITOT, PROT, ALBUMIN,  in the last 168 hours No results found for this basename: LIPASE, AMYLASE,  in the last 168 hours No results found for this basename: AMMONIA,  in the last 168 hours  CBC: No results found for this basename: WBC, NEUTROABS, HGB, HCT, MCV, PLT,  in the last 168 hours  Cardiac Enzymes: No results found for this basename: CKTOTAL, CKMB, CKMBINDEX, TROPONINI,  in the last 168 hours  BNP: BNP (last 3 results)  Recent Labs  08/29/13 0936 10/18/13 0205  PROBNP 1262.0* 619.8*     Other results:  :   Imaging: No results found.     Assessment/Plan   1.PAH - suspected portopulmonary HTN  Stable NYHA III on combination therapy - macitentan 10 and adcirca 40 mg daily. Volume status ok. 6MW stable but echo with ongoing  RV strain.  Will need repeat RHC and probable oral treprostinil. Counseled on need for ETOH cessation.  2. Chronic Renal Failure - reatinine baseline 1.7  3. ETOH cirrhosis - counseled on need to stop ETOH completely as this is likely contributing to [facility]. 4. HTN - BP  stable. Can continue losartan for now.  Daniel Bensimhon,MD 2:30 PM

## 2014-05-29 NOTE — Patient Instructions (Signed)
For Right Heart Cath on August 4th.  Plan to arrive to Admitting at 10:30AM Please do not eat or drink anything after midnight.

## 2014-05-29 NOTE — Progress Notes (Signed)
Echo Lab  2D Echocardiogram completed.  Darren Mueller, RDCS 05/29/2014 1:46 PM

## 2014-05-30 ENCOUNTER — Telehealth (HOSPITAL_COMMUNITY): Payer: Self-pay | Admitting: Cardiology

## 2014-05-30 NOTE — Telephone Encounter (Signed)
Pt scheduled for RHC on 06/06/14 Cpt [id number] icd 9 code-416.8 With pts current insurance- [employer] No pre cert req'd

## 2014-06-02 ENCOUNTER — Telehealth (HOSPITAL_COMMUNITY): Payer: Self-pay | Admitting: Vascular Surgery

## 2014-06-02 NOTE — Telephone Encounter (Signed)
Pt has questions about a procedure he having Monday.Marland Kitchen He asked to speak to Chantel.. Please advise

## 2014-06-02 NOTE — Telephone Encounter (Signed)
Reviewed procedure instructions and addressed all questions will patient

## 2014-06-06 ENCOUNTER — Encounter (HOSPITAL_COMMUNITY)
Admission: RE | Disposition: A | Discharge status: Home / Self Care | Payer: Self-pay | Source: Ambulatory Visit | Attending: Internal Medicine

## 2014-06-06 ENCOUNTER — Ambulatory Visit (HOSPITAL_COMMUNITY)
Admission: RE | Admit: 2014-06-06 | Discharge: 2014-06-06 | Disposition: A | Discharge status: Home / Self Care | Source: Ambulatory Visit | Attending: Internal Medicine | Admitting: Internal Medicine

## 2014-06-06 DIAGNOSIS — I2789 Other specified pulmonary heart diseases: Secondary | ICD-10-CM | POA: Insufficient documentation

## 2014-06-06 DIAGNOSIS — Z79899 Other long term (current) drug therapy: Secondary | ICD-10-CM | POA: Insufficient documentation

## 2014-06-06 DIAGNOSIS — F1020 Alcohol dependence, uncomplicated: Secondary | ICD-10-CM | POA: Insufficient documentation

## 2014-06-06 DIAGNOSIS — I279 Pulmonary heart disease, unspecified: Secondary | ICD-10-CM

## 2014-06-06 DIAGNOSIS — IMO0002 Reserved for concepts with insufficient information to code with codable children: Secondary | ICD-10-CM | POA: Insufficient documentation

## 2014-06-06 DIAGNOSIS — K766 Portal hypertension: Secondary | ICD-10-CM | POA: Insufficient documentation

## 2014-06-06 DIAGNOSIS — N189 Chronic kidney disease, unspecified: Secondary | ICD-10-CM | POA: Insufficient documentation

## 2014-06-06 DIAGNOSIS — I129 Hypertensive chronic kidney disease with stage 1 through stage 4 chronic kidney disease, or unspecified chronic kidney disease: Secondary | ICD-10-CM | POA: Insufficient documentation

## 2014-06-06 DIAGNOSIS — J4489 Other specified chronic obstructive pulmonary disease: Secondary | ICD-10-CM | POA: Insufficient documentation

## 2014-06-06 DIAGNOSIS — J449 Chronic obstructive pulmonary disease, unspecified: Secondary | ICD-10-CM | POA: Insufficient documentation

## 2014-06-06 DIAGNOSIS — K7030 Alcoholic cirrhosis of liver without ascites: Secondary | ICD-10-CM | POA: Insufficient documentation

## 2014-06-06 HISTORY — PX: RIGHT HEART CATHETERIZATION: SHX5447

## 2014-06-06 LAB — BASIC METABOLIC PANEL
ANION GAP: 17 — AB (ref 5–15)
BUN: 19 mg/dL (ref 6–23)
CALCIUM: 9.2 mg/dL (ref 8.4–10.5)
CO2: 18 mEq/L — ABNORMAL LOW (ref 19–32)
CREATININE: 1.41 mg/dL — AB (ref 0.50–1.35)
Chloride: 105 mEq/L (ref 96–112)
GFR calc Af Amer: 59 mL/min — ABNORMAL LOW (ref >90)
GFR, EST NON AFRICAN AMERICAN: 51 mL/min — AB (ref >90)
Glucose, Bld: 100 mg/dL — ABNORMAL HIGH (ref 70–99)
Potassium: 4 mEq/L (ref 3.7–5.3)
SODIUM: 140 meq/L (ref 137–147)

## 2014-06-06 LAB — POCT I-STAT 3, VENOUS BLOOD GAS (G3P V)
ACID-BASE DEFICIT: 3 mmol/L — AB (ref 0.0–2.0)
Acid-base deficit: 3 mmol/L — ABNORMAL HIGH (ref 0.0–2.0)
Bicarbonate: 20.9 mEq/L (ref 20.0–24.0)
Bicarbonate: 21 mEq/L (ref 20.0–24.0)
O2 Saturation: 67 %
O2 Saturation: 71 %
PCO2 VEN: 32.9 mmHg — AB (ref 45.0–50.0)
PH VEN: 7.41 — AB (ref 7.250–7.300)
PH VEN: 7.42 — AB (ref 7.250–7.300)
PO2 VEN: 34 mmHg (ref 30.0–45.0)
TCO2: 22 mmol/L (ref 0–100)
TCO2: 22 mmol/L (ref 0–100)
pCO2, Ven: 32.3 mmHg — ABNORMAL LOW (ref 45.0–50.0)
pO2, Ven: 36 mmHg (ref 30.0–45.0)

## 2014-06-06 LAB — PROTIME-INR
INR: 1.28 (ref 0.00–1.49)
PROTHROMBIN TIME: 16 s — AB (ref 11.6–15.2)

## 2014-06-06 LAB — CBC
HCT: 38.9 % — ABNORMAL LOW (ref 39.0–52.0)
Hemoglobin: 13.1 g/dL (ref 13.0–17.0)
MCH: 34.7 pg — ABNORMAL HIGH (ref 26.0–34.0)
MCHC: 33.7 g/dL (ref 30.0–36.0)
MCV: 103.2 fL — AB (ref 78.0–100.0)
PLATELETS: 67 10*3/uL — AB (ref 150–400)
RBC: 3.77 MIL/uL — ABNORMAL LOW (ref 4.22–5.81)
RDW: 13.5 % (ref 11.5–15.5)
WBC: 3.3 10*3/uL — ABNORMAL LOW (ref 4.0–10.5)

## 2014-06-06 SURGERY — RIGHT HEART CATH
Anesthesia: LOCAL | Laterality: Right

## 2014-06-06 MED ORDER — FENTANYL CITRATE 0.05 MG/ML IJ SOLN
INTRAMUSCULAR | Status: AC
  Filled 2014-06-06: qty 2

## 2014-06-06 MED ORDER — SODIUM CHLORIDE 0.9 % IV SOLN
INTRAVENOUS | Status: DC
  Administered 2014-06-06: 11:00:00 via INTRAVENOUS

## 2014-06-06 MED ORDER — SODIUM CHLORIDE 0.9 % IV SOLN: 250.0000 mL | INTRAVENOUS | Status: DC | PRN

## 2014-06-06 MED ORDER — LIDOCAINE HCL (PF) 1 % IJ SOLN
INTRAMUSCULAR | Status: AC
  Filled 2014-06-06: qty 30

## 2014-06-06 MED ORDER — ACETAMINOPHEN 325 MG PO TABS: 650.0000 mg | ORAL_TABLET | ORAL | Status: DC | PRN

## 2014-06-06 MED ORDER — ONDANSETRON HCL 4 MG/2ML IJ SOLN: 4.0000 mg | Freq: Four times a day (QID) | INTRAMUSCULAR | Status: DC | PRN

## 2014-06-06 MED ORDER — SODIUM CHLORIDE 0.9 % IJ SOLN: 3.0000 mL | Freq: Two times a day (BID) | INTRAMUSCULAR | Status: DC

## 2014-06-06 MED ORDER — SODIUM CHLORIDE 0.9 % IJ SOLN: 3.0000 mL | INTRAMUSCULAR | Status: DC | PRN

## 2014-06-06 MED ORDER — MIDAZOLAM HCL 2 MG/2ML IJ SOLN
INTRAMUSCULAR | Status: AC
  Filled 2014-06-06: qty 2

## 2014-06-06 MED ORDER — HEPARIN (PORCINE) IN NACL 2-0.9 UNIT/ML-% IJ SOLN
INTRAMUSCULAR | Status: AC
  Filled 2014-06-06: qty 1000

## 2014-06-06 NOTE — Interval H&P Note (Signed)
History and Physical Interval Note:  06/06/2014 2:56 PM  Darren Mueller  has presented today for surgery, with the diagnosis of heart failure  The various methods of treatment have been discussed with the patient and family. After consideration of risks, benefits and other options for treatment, the patient has consented to  Procedure(s): RIGHT HEART CATH (Right) as a surgical intervention .  The patient's history has been reviewed, patient examined, no change in status, stable for surgery.  I have reviewed the patient's chart and labs.  Questions were answered to the patient's satisfaction.     Lavan Imes

## 2014-06-06 NOTE — H&P (View-Only) (Signed)
Patient ID: Darren Mueller, male   DOB: 03-17-1951, 63 y.o.   MRN: [id number]  GI : Dr Ardis Hughs PCP: DrBlyth  Subjective:    63 yo with history of COPD, portopulmonary hypertension with RV failure, and ETOH cirrhosis presents after syncopal episode. Over the past 2 weeks, he has had loose stool 2-3 times/day every day. He was started on cholestyramine by his PCP.   [place] 2/14:  RA = 13  RV = 63/5/14  PA = 65/24 (41)  PCW = 6  Hepatic wedge = 21  Fick cardiac output/index = 3.8/1.8  PVR = 9.1  FA sat = 94%  PA sat = 58%, 56%  SVC = 54%  PFTs 3/14 FEV1 2.02 L (58%) FVC 2.7L (54%) FEV1/FVC (89%) DLCO 53%  Admitted in December 2014 with syncope in the setting of persistent diarrhea. C.diff and O&P negative.  Creatinine was up to 2.7 from 1.7 baseline. He was hydrated. Losartan and lasix stopped. Told to hold lasix unless weight 208 or greater. Event Monitor - NSR 10/26/13.   ECHO 12/14 EF 55-60% Peak PA pressure 35. Severe RV dysfunction  ECHO 7/15 EF 60% RV moderately to severely dilated. Moderate HK RVSP 44m HG   6 min walk 01/10/13, 1290 feet 6MW (01/10/14) = 1280 feet (380 m)  He returns for follow up. At last visit losartan stopped due to hypotension but somehow got restarted at 535mdaily.  Says he is doing OK. Complaining of dyspnea with exertion - no change (NYHA III) - has to stop when walking around store then restarts.  Weight stable around 215 pounds.  No edema. Denies presyncope. Compliant with medications. On Adcirca and macitentan. Has not quit ETOH - drinks 3 vodkas/2x per week    10/19/13 Creatinine 1.79 K 4.1  3/15         Cr 1.78 K 4.2 03/17/14    CR 1.23 K 4.0 LFTs ok  Current Outpatient Prescriptions on File Prior to Encounter  Medication Sig Dispense Refill  . allopurinol (ZYLOPRIM) 100 MG tablet TAKE 1 TABLET DAILY  90 tablet  0  . diphenoxylate-atropine (LOMOTIL) 2.5-0.025 MG per tablet Take 1 tablet by mouth 2 (two) times daily.  60 tablet  3  . Doxepin HCl  (SILENOR) 6 MG TABS Take 1 tablet (6 mg total) by mouth as needed.  90 tablet  1  . furosemide (LASIX) 40 MG tablet Take 1 tablet (40 mg total) by mouth as directed. Every Mon and Fri  30 tablet    . levothyroxine (SYNTHROID, LEVOTHROID) 25 MCG tablet TAKE 1 TABLET DAILY  90 tablet  0  . loperamide (ANTI-DIARRHEAL) 2 MG capsule Take by mouth as needed for diarrhea or loose stools.      . Marland Kitchenosartan (COZAAR) 100 MG tablet Take 50 mg by mouth daily.       . Macitentan (OPSUMIT) 10 MG TABS Take 10 mg by mouth daily.  90 tablet  3  . mometasone-formoterol (DULERA) 200-5 MCG/ACT AERO Inhale 2 puffs into the lungs 2 (two) times daily.  13 g  6  . neomycin-polymyxin-hydrocortisone (CORTISPORIN) otic solution Place 3 drops into both ears 3 (three) times daily.  10 mL  1  . omeprazole (PRILOSEC) 40 MG capsule TAKE 1 CAPSULE DAILY  90 capsule  0  . potassium chloride (K-DUR) 10 MEQ tablet Take 2 tablets (20 mEq total) by mouth daily. Take extra 2 tabs on Mon and Fri  90 tablet  3  . saccharomyces boulardii (FLORASTOR) 250  MG capsule Take 1 capsule (250 mg total) by mouth 2 (two) times daily.  60 capsule  1  . spironolactone (ALDACTONE) 50 MG tablet Take 1 tablet (50 mg total) by mouth daily.  90 tablet  1  . Tadalafil, PAH, (ADCIRCA) 20 MG TABS Take 2 tablets (40 mg total) by mouth daily.  180 tablet  3  . triamcinolone cream (KENALOG) 0.1 % Apply 1 application topically 2 (two) times daily as needed. Dermatitis, 90 day supply, 3 tubes  85.2 g  1  . verapamil (VERELAN PM) 180 MG 24 hr capsule Take 180 mg by mouth 2 (two) times daily.      . VERELAN 180 MG 24 hr capsule TAKE 1 CAPSULE AT BEDTIME  90 capsule  0  . zolpidem (AMBIEN) 10 MG tablet Take 1 tablet (10 mg total) by mouth at bedtime as needed for sleep.  30 tablet  2  . hyoscyamine (SYMAX-SR) 0.375 MG 12 hr tablet Take 1 tablet (0.375 mg total) by mouth 2 (two) times daily.  180 tablet  0   No current facility-administered medications on file prior to  encounter.     Objective:   Weight Range:  Vital Signs:   Pulse Rate:  [98] 98 (07/27 1408) BP: (116)/(82) 116/82 mmHg (07/27 1408) SpO2:  [93 %] 93 % (07/27 1408) Weight:  [216 lb 12.8 oz (98.34 kg)] 216 lb 12.8 oz (98.34 kg) (07/27 1408)    Weight change: Filed Weights   05/29/14 1408  Weight: 216 lb 12.8 oz (98.34 kg)    Intake/Output:  No intake or output data in the 24 hours ending 05/29/14 1430   Physical Exam: General: NAD  Neck: JVP ~6-7 , no thyromegaly or thyroid nodule.  Lungs: Clear to auscultation bilaterally with normal respiratory effort.  CV: Nondisplaced PMI. Heart regular  2/6 TR P2 LLSB. No carotid bruit. Normal pedal pulses.  Abdomen: Soft, nontender, no hepatosplenomegaly, no distention.  Ext: warm No edema. No clubbing Skin: Intact without lesions or rashes.  Neurologic: Alert and oriented x 3.  Psych: Normal affect.  Extremities: No clubbing or cyanosis.  HEENT: Normal.   Labs: Basic Metabolic Panel: No results found for this basename: NA, K, CL, CO2, GLUCOSE, BUN, CREATININE, CALCIUM, MG, PHOS,  in the last 168 hours  Liver Function Tests: No results found for this basename: AST, ALT, ALKPHOS, BILITOT, PROT, ALBUMIN,  in the last 168 hours No results found for this basename: LIPASE, AMYLASE,  in the last 168 hours No results found for this basename: AMMONIA,  in the last 168 hours  CBC: No results found for this basename: WBC, NEUTROABS, HGB, HCT, MCV, PLT,  in the last 168 hours  Cardiac Enzymes: No results found for this basename: CKTOTAL, CKMB, CKMBINDEX, TROPONINI,  in the last 168 hours  BNP: BNP (last 3 results)  Recent Labs  08/29/13 0936 10/18/13 0205  PROBNP 1262.0* 619.8*     Other results:  :   Imaging: No results found.     Assessment/Plan   1.PAH - suspected portopulmonary HTN  Stable NYHA III on combination therapy - macitentan 10 and adcirca 40 mg daily. Volume status ok. 6MW stable but echo with ongoing  RV strain.  Will need repeat RHC and probable oral treprostinil. Counseled on need for ETOH cessation.  2. Chronic Renal Failure - reatinine baseline 1.7  3. ETOH cirrhosis - counseled on need to stop ETOH completely as this is likely contributing to [facility]. 4. HTN - BP  stable. Can continue losartan for now.  Royale Lennartz,MD 2:30 PM

## 2014-06-06 NOTE — Discharge Instructions (Signed)
Angiogram, Care After Refer to this sheet in the next few weeks. These instructions provide you with information on caring for yourself after your procedure. Your health care provider may also give you more specific instructions. Your treatment has been planned according to current medical practices, but problems sometimes occur. Call your health care provider if you have any problems or questions after your procedure.  WHAT TO EXPECT AFTER THE PROCEDURE After your procedure, it is typical to have the following sensations:  Minor discomfort or tenderness and a small bump at the catheter insertion site. The bump should usually decrease in size and tenderness within 1 to 2 weeks.  Any bruising will usually fade within 2 to 4 weeks. HOME CARE INSTRUCTIONS   You may need to keep taking blood thinners if they were prescribed for you. Take medicines only as directed by your health care provider.  Do not apply powder or lotion to the site.  Do not take baths, swim, or use a hot tub until your health care provider approves.  You may shower 24 hours after the procedure. Remove the bandage (dressing) and gently wash the site with plain soap and water. Gently pat the site dry.  Inspect the site at least twice daily.  Limit your activity for the first 48 hours. Do not bend, squat, or lift anything over 20 lb (9 kg) or as directed by your health care provider.  Plan to have someone take you home after the procedure. Follow instructions about when you can drive or return to work. SEEK MEDICAL CARE IF:  You get light-headed when standing up.  You have drainage (other than a small amount of blood on the dressing).  You have chills.  You have a fever.  You have redness, warmth, swelling, or pain at the insertion site. SEEK IMMEDIATE MEDICAL CARE IF:   You develop chest pain or shortness of breath, feel faint, or pass out.  You have bleeding, swelling larger than a walnut, or drainage from the  catheter insertion site.  You develop pain, discoloration, coldness, or severe bruising in the leg or arm that held the catheter..  You have heavy bleeding from the site. If this happens, hold pressure on the site. MAKE SURE YOU:  Understand these instructions.  Will watch your condition.  Will get help right away if you are not doing well or get worse. Document Released: 05/08/2005 Document Revised: 03/06/2014 Document Reviewed: 03/14/2013 [facility] Patient Information 2015 [place], [place]. This information is not intended to replace advice given to you by your health care provider. Make sure you discuss any questions you have with your health care provider.

## 2014-06-06 NOTE — CV Procedure (Signed)
Cardiac Cath Procedure Note:  Indication:  PAH  Procedures performed:  1) Right heart catheterization  Description of procedure:   The risks and indication of the procedure were explained. Consent was signed and placed on the chart. An appropriate timeout was taken prior to the procedure. The right groin was prepped and draped in the routine sterile fashion and anesthetized with 1% local lidocaine.   A 8 FR venous sheath was placed in the right femoral vein using a modified Seldinger technique. A standard Swan-Ganz catheter was used for the procedure.   Complications: None apparent.  Findings:  RA = 5 RV = 73/5/9 PA = 81/25 (46) PCW = 7 Fick cardiac output/index = 6.0/2.8 PVR = 6.5 Woods FA sat = 95% PA sat = 71%, 67% Unable to get hepatic wedge  Assessment: 1. Moderate to severe PAH  2. Normal PCWP 3. Normal cardiac output  Plan/Discussion:  His PA pressures continue to increase despite combination therapy. Will add oral treprostinil. Counseled on need to stop ETOH completely.   Glori Bickers MD 2:57 PM

## 2014-06-13 ENCOUNTER — Encounter (HOSPITAL_COMMUNITY): Payer: Self-pay

## 2014-06-13 ENCOUNTER — Ambulatory Visit (HOSPITAL_COMMUNITY)
Admission: RE | Admit: 2014-06-13 | Discharge: 2014-06-13 | Disposition: A | Discharge status: Home / Self Care | Source: Ambulatory Visit | Attending: Internal Medicine | Admitting: Internal Medicine

## 2014-06-13 VITALS — BP 141/74 | HR 111 | Resp 18 | Wt 211.5 lb

## 2014-06-13 DIAGNOSIS — I509 Heart failure, unspecified: Secondary | ICD-10-CM

## 2014-06-13 DIAGNOSIS — J4489 Other specified chronic obstructive pulmonary disease: Secondary | ICD-10-CM | POA: Insufficient documentation

## 2014-06-13 DIAGNOSIS — I2789 Other specified pulmonary heart diseases: Secondary | ICD-10-CM

## 2014-06-13 DIAGNOSIS — J9600 Acute respiratory failure, unspecified whether with hypoxia or hypercapnia: Secondary | ICD-10-CM | POA: Insufficient documentation

## 2014-06-13 DIAGNOSIS — K766 Portal hypertension: Secondary | ICD-10-CM

## 2014-06-13 DIAGNOSIS — J449 Chronic obstructive pulmonary disease, unspecified: Secondary | ICD-10-CM | POA: Insufficient documentation

## 2014-06-13 DIAGNOSIS — K7030 Alcoholic cirrhosis of liver without ascites: Secondary | ICD-10-CM | POA: Insufficient documentation

## 2014-06-13 DIAGNOSIS — I2721 Secondary pulmonary arterial hypertension: Secondary | ICD-10-CM

## 2014-06-13 DIAGNOSIS — F10288 Alcohol dependence with other alcohol-induced disorder: Secondary | ICD-10-CM

## 2014-06-13 DIAGNOSIS — R55 Syncope and collapse: Secondary | ICD-10-CM | POA: Insufficient documentation

## 2014-06-13 DIAGNOSIS — F10988 Alcohol use, unspecified with other alcohol-induced disorder: Secondary | ICD-10-CM

## 2014-06-13 DIAGNOSIS — I2729 Other secondary pulmonary hypertension: Secondary | ICD-10-CM

## 2014-06-13 DIAGNOSIS — N189 Chronic kidney disease, unspecified: Secondary | ICD-10-CM | POA: Insufficient documentation

## 2014-06-13 DIAGNOSIS — I50810 Right heart failure, unspecified: Secondary | ICD-10-CM

## 2014-06-13 DIAGNOSIS — I129 Hypertensive chronic kidney disease with stage 1 through stage 4 chronic kidney disease, or unspecified chronic kidney disease: Secondary | ICD-10-CM | POA: Insufficient documentation

## 2014-06-13 DIAGNOSIS — F1020 Alcohol dependence, uncomplicated: Secondary | ICD-10-CM

## 2014-06-13 NOTE — Patient Instructions (Signed)
Your physician recommends that you schedule a follow-up appointment in: 2 months

## 2014-06-13 NOTE — Progress Notes (Signed)
Patient ID: Darren Mueller, male   DOB: 1950-12-01, 63 y.o.   MRN: [id number]  GI : Dr Ardis Hughs PCP: DrBlyth  Subjective:    63 yo with history of COPD, portopulmonary hypertension with RV failure, and ETOH cirrhosis presents after syncopal episode. Over the past 2 weeks, he has had loose stool 2-3 times/day every day. He was started on cholestyramine by his PCP.   [place] 2/14:  RA = 13  RV = 63/5/14  PA = 65/24 (41)  PCW = 6  Hepatic wedge = 21  Fick cardiac output/index = 3.8/1.8  PVR = 9.1  FA sat = 94%  PA sat = 58%, 56%  SVC = 54%  PFTs 3/14 FEV1 2.02 L (58%) FVC 2.7L (54%) FEV1/FVC (89%) DLCO 53%  RHC 06/06/14 RA = 5  RV = 73/5/9  PA = 81/25 (46)  PCW = 7  Fick cardiac output/index = 6.0/2.8  PVR = 6.5 Woods  FA sat = 95%  PA sat = 71%, 67%  Unable to get hepatic wedge   Admitted in December 2014 with syncope in the setting of persistent diarrhea. C.diff and O&P negative.  Creatinine was up to 2.7 from 1.7 baseline. He was hydrated. Losartan and lasix stopped. Told to hold lasix unless weight 208 or greater. Event Monitor - NSR 10/26/13.   ECHO 12/14 EF 55-60% Peak PA pressure 35. Severe RV dysfunction  ECHO 7/15 EF 60% RV moderately to severely dilated. Moderate HK RVSP 15m HG   6 min walk 01/10/13, 1290 feet 6MW (01/10/14) = 1280 feet (380 m)  Follow-up:  He remains on macitentan 10 and adcirca 40 mg daily. Since last visit underwent RHC due to persistent RV strain on echo. RHC showed worsening PAH but improved RA pressure and cardiac output. Decision made to start oral treprostinil. (Awaiting approval). He presents today with his sons to discuss his condition.  Continues with NYHA III dyspnea. Weight stable around 215 pounds.  No edema. Denies presyncope. Compliant with medications. Has not quit ETOH - drinks 3 vodkas/2x per week.    10/19/13 Creatinine 1.79 K 4.1  3/15         Cr 1.78 K 4.2 03/17/14    CR 1.23 K 4.0 LFTs ok 06/06/14 K 4.0 Creatinine 1.4   Current  Outpatient Prescriptions on File Prior to Encounter  Medication Sig Dispense Refill  . allopurinol (ZYLOPRIM) 100 MG tablet Take 100 mg by mouth daily.      . diphenoxylate-atropine (LOMOTIL) 2.5-0.025 MG per tablet Take 1 tablet by mouth 2 (two) times daily.  60 tablet  3  . Doxepin HCl 6 MG TABS Take 6 mg by mouth daily as needed (anxiety).      . furosemide (LASIX) 40 MG tablet Take 1 tablet (40 mg total) by mouth as directed. Every Mon and Fri  30 tablet    . hyoscyamine (SYMAX-SR) 0.375 MG 12 hr tablet Take 1 tablet (0.375 mg total) by mouth 2 (two) times daily.  180 tablet  0  . levothyroxine (SYNTHROID, LEVOTHROID) 25 MCG tablet Take 25 mcg by mouth daily before breakfast.      . loperamide (ANTI-DIARRHEAL) 2 MG capsule Take 2 mg by mouth every 4 (four) hours as needed for diarrhea or loose stools.       .Marland Kitchenlosartan (COZAAR) 100 MG tablet Take 0.5 tablets (50 mg total) by mouth daily.  90 tablet  3  . Macitentan (OPSUMIT) 10 MG TABS Take 10 mg by mouth daily.  90 tablet  3  . mometasone-formoterol (DULERA) 200-5 MCG/ACT AERO Inhale 2 puffs into the lungs 2 (two) times daily.  13 g  6  . omeprazole (PRILOSEC) 40 MG capsule Take 40 mg by mouth daily.      . potassium chloride (K-DUR) 10 MEQ tablet Take 2 tablets (20 mEq total) by mouth daily. Take extra 2 tabs on Mon and Fri  90 tablet  3  . spironolactone (ALDACTONE) 50 MG tablet Take 1 tablet (50 mg total) by mouth daily.  90 tablet  1  . Tadalafil, PAH, (ADCIRCA) 20 MG TABS Take 2 tablets (40 mg total) by mouth daily.  180 tablet  3  . triamcinolone cream (KENALOG) 0.1 % Apply 1 application topically 2 (two) times daily as needed. Dermatitis, 90 day supply, 3 tubes  85.2 g  1  . verapamil (VERELAN PM) 180 MG 24 hr capsule Take 180 mg by mouth at bedtime.      . VERELAN 180 MG 24 hr capsule TAKE 1 CAPSULE AT BEDTIME  90 capsule  0  . zolpidem (AMBIEN) 10 MG tablet Take 1 tablet (10 mg total) by mouth at bedtime as needed for sleep.  30  tablet  2   No current facility-administered medications on file prior to encounter.     Objective:   Weight Range:  Vital Signs:   Pulse Rate:  [111] 111 (08/11 1518) Resp:  [18] 18 (08/11 1518) BP: (141)/(74) 141/74 mmHg (08/11 1518) SpO2:  [98 %] 98 % (08/11 1518) Weight:  [211 lb 8 oz (95.936 kg)] 211 lb 8 oz (95.936 kg) (08/11 1518)    Weight change: Filed Weights   06/13/14 1518  Weight: 211 lb 8 oz (95.936 kg)    Intake/Output:  No intake or output data in the 24 hours ending 06/13/14 1538   Physical Exam: General: NAD  Neck: JVP ~6-7 , no thyromegaly or thyroid nodule.  Lungs: Clear to auscultation bilaterally with normal respiratory effort.  CV: Nondisplaced PMI. Heart regular  2/6 TR P2 LLSB. No carotid bruit. Normal pedal pulses.  Abdomen: Soft, nontender, no hepatosplenomegaly, no distention.  Ext: warm No edema. No clubbing Skin: Intact without lesions or rashes.  Neurologic: Alert and oriented x 3.  Psych: Normal affect.  Extremities: No clubbing or cyanosis.  HEENT: Normal.   Labs: Basic Metabolic Panel: No results found for this basename: NA, K, CL, CO2, GLUCOSE, BUN, CREATININE, CALCIUM, MG, PHOS,  in the last 168 hours  Liver Function Tests: No results found for this basename: AST, ALT, ALKPHOS, BILITOT, PROT, ALBUMIN,  in the last 168 hours No results found for this basename: LIPASE, AMYLASE,  in the last 168 hours No results found for this basename: AMMONIA,  in the last 168 hours  CBC: No results found for this basename: WBC, NEUTROABS, HGB, HCT, MCV, PLT,  in the last 168 hours  Cardiac Enzymes: No results found for this basename: CKTOTAL, CKMB, CKMBINDEX, TROPONINI,  in the last 168 hours  BNP: BNP (last 3 results)  Recent Labs  08/29/13 0936 10/18/13 0205  PROBNP 1262.0* 619.8*     Other results:  :   Imaging: No results found.    Assessment/Plan    1. PAH - suspected portopulmonary HTN  Stable NYHA III on  combination therapy - macitentan 10 and adcirca 40 mg daily. PA pressures more elevated on last cath but may be due in part to improved RV function. Will add oral treprostinil. Counseled on need for  ETOH cessation.  2. Chronic Renal Failure - reatinine baseline 1.7  3. ETOH cirrhosis - counseled on need to stop ETOH completely as this is likely contributing to [facility]. 4. HTN - BP stable. Can continue losartan for now.  Long talk with his sons about his PAH diagnosis, prognosis and therapy. Also discussed role ETOH may be playing.   Total time spent 40 minutes. Over half that time spent discussing above.    Glori Bickers, MD 3:38 PM

## 2014-06-15 NOTE — Addendum Note (Signed)
Encounter addended by: Georga Kaufmann, CCT on: 06/15/2014  8:57 AM<BR>     Documentation filed: Charges VN

## 2014-07-05 ENCOUNTER — Telehealth (HOSPITAL_COMMUNITY): Payer: Self-pay | Admitting: Vascular Surgery

## 2014-07-05 MED ORDER — TREPROSTINIL DIOLAMINE ER 0.125 MG PO TBCR: 0.1250 mg | EXTENDED_RELEASE_TABLET | Freq: Three times a day (TID) | ORAL | Status: DC

## 2014-07-05 NOTE — Telephone Encounter (Signed)
Spoke w/pt he started [place] on Friday and this AM his wrist was very painful, he states he was unable to use it to open pill bottle it was so sore, he denies swelling or redness, he has talked with company and they have advised a SE is soreness of the body, he has been taking tylenol and feels it is helping, he will let me know tomorrow if not better

## 2014-07-05 NOTE — Telephone Encounter (Signed)
Pt called he has questions about new medication he is on and  he is having terrible right hand pain.Marland Kitchen Please advise

## 2014-07-15 ENCOUNTER — Other Ambulatory Visit: Payer: Self-pay | Admitting: Family Medicine

## 2014-07-19 ENCOUNTER — Other Ambulatory Visit: Payer: Self-pay | Admitting: Family Medicine

## 2014-07-25 ENCOUNTER — Ambulatory Visit (INDEPENDENT_AMBULATORY_CARE_PROVIDER_SITE_OTHER): Admitting: Family Medicine

## 2014-07-25 ENCOUNTER — Encounter: Payer: Self-pay | Admitting: Family Medicine

## 2014-07-25 VITALS — BP 137/90 | HR 72 | Temp 97.6°F | Ht 72.0 in | Wt 218.8 lb

## 2014-07-25 DIAGNOSIS — K219 Gastro-esophageal reflux disease without esophagitis: Secondary | ICD-10-CM

## 2014-07-25 DIAGNOSIS — I2729 Other secondary pulmonary hypertension: Secondary | ICD-10-CM

## 2014-07-25 DIAGNOSIS — I2789 Other specified pulmonary heart diseases: Secondary | ICD-10-CM

## 2014-07-25 DIAGNOSIS — M109 Gout, unspecified: Secondary | ICD-10-CM

## 2014-07-25 DIAGNOSIS — G4700 Insomnia, unspecified: Secondary | ICD-10-CM

## 2014-07-25 DIAGNOSIS — E039 Hypothyroidism, unspecified: Secondary | ICD-10-CM

## 2014-07-25 DIAGNOSIS — I10 Essential (primary) hypertension: Secondary | ICD-10-CM

## 2014-07-25 DIAGNOSIS — I50810 Right heart failure, unspecified: Secondary | ICD-10-CM

## 2014-07-25 DIAGNOSIS — Z23 Encounter for immunization: Secondary | ICD-10-CM

## 2014-07-25 DIAGNOSIS — I509 Heart failure, unspecified: Secondary | ICD-10-CM

## 2014-07-25 MED ORDER — TEMAZEPAM 7.5 MG PO CAPS: 7.5000 mg | ORAL_CAPSULE | Freq: Every evening | ORAL | Status: DC | PRN

## 2014-07-25 NOTE — Progress Notes (Signed)
Patient ID: Darren Mueller, male   DOB: 03/24/1951, 63 y.o.   MRN: [id number]  Darren Mueller [id number] 1951-03-11 07/25/2014      Progress Note-Follow Up  Subjective  Chief Complaint  Chief Complaint  Patient presents with  . Follow-up    follow up  . Injections    flu    HPI  Patient is a 63 year old male in today for routine medical care. Patient recently hospitalized with a flare and is CHF from his pulmonary hypertension. Is feeling much better today after medication changes but is aware that his disease is progressive and limiting. He notes some recent headaches with the medication changes but they're tolerable. No other recent illness. No fevers or chills. No congestion. It is pleased that his children now under some better when he is going through due to conversation with cardiology during hospitalization. Denies CP/palp/HA/congestion/fevers/GI or GU c/o. Taking meds as prescribed  Past Medical History  Diagnosis Date  . Hypertension   . Seizures   . Hx of colonic polyps   . GERD (gastroesophageal reflux disease)   . Gout   . Alcoholic cirrhosis of liver with ascites 2014  . Unspecified hypothyroidism 01/25/2013  . Unspecified pleural effusion   . Other chronic pulmonary heart diseases   . Hypopotassemia   . Renal insufficiency 07/18/2013  . Diarrhea 09/04/2013  . Otalgia 11/28/2013    Past Surgical History  Procedure Laterality Date  . Colonoscopy N/A 12/08/2013    Procedure: COLONOSCOPY;  Surgeon: Milus Banister, MD;  Location: [facility];  Service: Endoscopy;  Laterality: N/A;    Family History  Problem Relation Age of Onset  . Heart disease Mother   . Prostate cancer Father   . Colon cancer Neg Hx   . Heart attack Mother   . Hypertension Mother     History   Social History  . Marital Status: Divorced    Spouse Name: N/A    Number of Children: 3  . Years of Education: N/A   Occupational History  . Retired     Social research officer, government   Social History Main  Topics  . Smoking status: Former Smoker -- 2.00 packs/day for 5 years    Types: Cigarettes    Quit date: 09/22/1979  . Smokeless tobacco: Never Used  . Alcohol Use: Yes     Comment: 3-4 cans of beers and 4-5 drinks of vodka - has not drunk any alcohol in the last few weeks  . Drug Use: No  . Sexual Activity: Not on file   Other Topics Concern  . Not on file   Social History Narrative   0 caffeine drinks daily     Current Outpatient Prescriptions on File Prior to Visit  Medication Sig Dispense Refill  . allopurinol (ZYLOPRIM) 100 MG tablet TAKE 1 TABLET DAILY  90 tablet  1  . diphenoxylate-atropine (LOMOTIL) 2.5-0.025 MG per tablet Take 1 tablet by mouth 2 (two) times daily.  60 tablet  3  . Doxepin HCl 6 MG TABS Take 6 mg by mouth daily as needed (anxiety).      . furosemide (LASIX) 40 MG tablet Take 1 tablet (40 mg total) by mouth as directed. Every Mon and Fri  30 tablet    . hyoscyamine (SYMAX-SR) 0.375 MG 12 hr tablet Take 1 tablet (0.375 mg total) by mouth 2 (two) times daily.  180 tablet  0  . levothyroxine (SYNTHROID, LEVOTHROID) 25 MCG tablet TAKE 1 TABLET DAILY  90 tablet  1  . loperamide (ANTI-DIARRHEAL) 2 MG capsule Take 2 mg by mouth every 4 (four) hours as needed for diarrhea or loose stools.       Marland Kitchen losartan (COZAAR) 100 MG tablet Take 0.5 tablets (50 mg total) by mouth daily.  90 tablet  3  . Macitentan (OPSUMIT) 10 MG TABS Take 10 mg by mouth daily.  90 tablet  3  . mometasone-formoterol (DULERA) 200-5 MCG/ACT AERO Inhale 2 puffs into the lungs 2 (two) times daily.  13 g  6  . omeprazole (PRILOSEC) 40 MG capsule Take 40 mg by mouth daily.      . potassium chloride (K-DUR) 10 MEQ tablet Take 2 tablets (20 mEq total) by mouth daily. Take extra 2 tabs on Mon and Fri  90 tablet  3  . spironolactone (ALDACTONE) 50 MG tablet Take 1 tablet (50 mg total) by mouth daily.  90 tablet  1  . Tadalafil, PAH, (ADCIRCA) 20 MG TABS Take 2 tablets (40 mg total) by mouth daily.  180  tablet  3  . Treprostinil Diolamine ER (ORENITRAM) 0.125 MG TBCR Take 0.125 mg by mouth 3 (three) times daily.  120 tablet    . triamcinolone cream (KENALOG) 0.1 % Apply 1 application topically 2 (two) times daily as needed. Dermatitis, 90 day supply, 3 tubes  85.2 g  1  . VERELAN 180 MG 24 hr capsule TAKE 1 CAPSULE AT BEDTIME  90 capsule  1  . zolpidem (AMBIEN) 10 MG tablet Take 1 tablet (10 mg total) by mouth at bedtime as needed for sleep.  30 tablet  2   No current facility-administered medications on file prior to visit.    Allergies  Allergen Reactions  . Aspirin Other (See Comments)    hypertension    Review of Systems  Review of Systems  Constitutional: Negative for fever and malaise/fatigue.  HENT: Negative for congestion.   Eyes: Negative for discharge.  Respiratory: Negative for shortness of breath.   Cardiovascular: Negative for chest pain, palpitations and leg swelling.  Gastrointestinal: Negative for nausea, abdominal pain and diarrhea.  Genitourinary: Negative for dysuria.  Musculoskeletal: Negative for falls.  Skin: Negative for rash.  Neurological: Negative for loss of consciousness and headaches.  Endo/Heme/Allergies: Negative for polydipsia.  Psychiatric/Behavioral: Negative for depression and suicidal ideas. The patient is not nervous/anxious and does not have insomnia.     Objective  BP 137/90  Pulse 72  Temp(Src) 97.6 F (36.4 C) (Oral)  Ht 6' (1.829 m)  Wt 218 lb 12.8 oz (99.247 kg)  BMI 29.67 kg/m2  SpO2 99%  Physical Exam  Physical Exam  Constitutional: He is oriented to person, place, and time and well-developed, well-nourished, and in no distress. No distress.  HENT:  Head: Normocephalic and atraumatic.  Eyes: Conjunctivae are normal.  Neck: Neck supple. No thyromegaly present.  Cardiovascular: Normal rate, regular rhythm and normal heart sounds.   No murmur heard. Pulmonary/Chest: Effort normal and breath sounds normal. No respiratory  distress.  Abdominal: He exhibits no distension and no mass. There is no tenderness.  Musculoskeletal: He exhibits no edema.  Neurological: He is alert and oriented to person, place, and time.  Skin: Skin is warm.  Psychiatric: Memory, affect and judgment normal.    Lab Results  Component Value Date   TSH 3.250 03/17/2014   Lab Results  Component Value Date   WBC 3.3* 06/06/2014   HGB 13.1 06/06/2014   HCT 38.9* 06/06/2014   MCV 103.2* 06/06/2014  PLT 67* 06/06/2014   Lab Results  Component Value Date   CREATININE 1.41* 06/06/2014   BUN 19 06/06/2014   NA 140 06/06/2014   K 4.0 06/06/2014   CL 105 06/06/2014   CO2 18* 06/06/2014   Lab Results  Component Value Date   ALT 21 03/17/2014   AST 45* 03/17/2014   ALKPHOS 140* 03/17/2014   BILITOT 1.3* 03/17/2014   Lab Results  Component Value Date   CHOL 196 03/17/2014   Lab Results  Component Value Date   HDL 65 03/17/2014   Lab Results  Component Value Date   LDLCALC 105* 03/17/2014   Lab Results  Component Value Date   TRIG 128 03/17/2014   Lab Results  Component Value Date   CHOLHDL 3.0 03/17/2014     Assessment & Plan

## 2014-07-25 NOTE — Patient Instructions (Signed)

## 2014-07-25 NOTE — Progress Notes (Signed)
Pre visit review using our clinic review tool, if applicable. No additional management support is needed unless otherwise documented below in the visit note.

## 2014-07-29 ENCOUNTER — Other Ambulatory Visit: Payer: Self-pay | Admitting: Family Medicine

## 2014-07-30 ENCOUNTER — Encounter: Payer: Self-pay | Admitting: Family Medicine

## 2014-07-30 DIAGNOSIS — M109 Gout, unspecified: Secondary | ICD-10-CM | POA: Insufficient documentation

## 2014-07-30 NOTE — Assessment & Plan Note (Signed)
Maintain adequate hydration and minimize alcohol

## 2014-07-30 NOTE — Assessment & Plan Note (Signed)
Well controlled, no changes to meds. Encouraged heart healthy diet such as the DASH diet and exercise as tolerated.

## 2014-07-30 NOTE — Assessment & Plan Note (Signed)
Recently hospitalized with a flare, meds adjusted and patient feeling well today.

## 2014-07-30 NOTE — Assessment & Plan Note (Signed)
Avoid offending foods, start probiotics. Do not eat large meals in late evening and consider raising head of bed.  

## 2014-07-30 NOTE — Assessment & Plan Note (Signed)
On Levothyroxine, continue to monitor 

## 2014-07-30 NOTE — Assessment & Plan Note (Signed)
Encouraged good sleep hygiene such as dark, quiet room. No blue/green glowing lights such as computer screens in bedroom. No alcohol or stimulants in evening. Cut down on caffeine as able. Regular exercise is helpful but not just prior to bed time. Did not do well with Ambien will let him try Temazepam prn

## 2014-07-31 ENCOUNTER — Encounter (HOSPITAL_COMMUNITY): Payer: Self-pay

## 2014-07-31 ENCOUNTER — Ambulatory Visit (HOSPITAL_COMMUNITY)
Admission: RE | Admit: 2014-07-31 | Discharge: 2014-07-31 | Disposition: A | Discharge status: Home / Self Care | Source: Ambulatory Visit | Attending: Internal Medicine | Admitting: Internal Medicine

## 2014-07-31 VITALS — BP 128/90 | HR 107 | Wt 218.4 lb

## 2014-07-31 DIAGNOSIS — I2789 Other specified pulmonary heart diseases: Secondary | ICD-10-CM | POA: Diagnosis present

## 2014-07-31 DIAGNOSIS — Z79899 Other long term (current) drug therapy: Secondary | ICD-10-CM | POA: Diagnosis not present

## 2014-07-31 DIAGNOSIS — J449 Chronic obstructive pulmonary disease, unspecified: Secondary | ICD-10-CM | POA: Diagnosis not present

## 2014-07-31 DIAGNOSIS — F1020 Alcohol dependence, uncomplicated: Secondary | ICD-10-CM | POA: Diagnosis not present

## 2014-07-31 DIAGNOSIS — I2729 Other secondary pulmonary hypertension: Secondary | ICD-10-CM

## 2014-07-31 DIAGNOSIS — N183 Chronic kidney disease, stage 3 unspecified: Secondary | ICD-10-CM | POA: Insufficient documentation

## 2014-07-31 DIAGNOSIS — J4489 Other specified chronic obstructive pulmonary disease: Secondary | ICD-10-CM | POA: Insufficient documentation

## 2014-07-31 DIAGNOSIS — I50810 Right heart failure, unspecified: Secondary | ICD-10-CM

## 2014-07-31 DIAGNOSIS — R209 Unspecified disturbances of skin sensation: Secondary | ICD-10-CM | POA: Diagnosis present

## 2014-07-31 DIAGNOSIS — I509 Heart failure, unspecified: Secondary | ICD-10-CM

## 2014-07-31 DIAGNOSIS — K766 Portal hypertension: Secondary | ICD-10-CM

## 2014-07-31 DIAGNOSIS — K7030 Alcoholic cirrhosis of liver without ascites: Secondary | ICD-10-CM | POA: Insufficient documentation

## 2014-07-31 DIAGNOSIS — Z888 Allergy status to other drugs, medicaments and biological substances status: Secondary | ICD-10-CM | POA: Insufficient documentation

## 2014-07-31 DIAGNOSIS — I129 Hypertensive chronic kidney disease with stage 1 through stage 4 chronic kidney disease, or unspecified chronic kidney disease: Secondary | ICD-10-CM | POA: Diagnosis not present

## 2014-07-31 DIAGNOSIS — I2721 Secondary pulmonary arterial hypertension: Secondary | ICD-10-CM

## 2014-07-31 MED ORDER — FUROSEMIDE 40 MG PO TABS: 40.0000 mg | ORAL_TABLET | ORAL | Status: DC

## 2014-07-31 NOTE — Patient Instructions (Addendum)
Take Lasix 40 mg Mon(today) and Tues(tomorrow). Then next week Take lasix 40 mg Mon-Wed-Fri  Take an extra 20 meq potassium Monday-Wednesday-Friday  You have been referred to Pulmonary Rehab, they will call you to schedule appointment  Labs in 10 days  Do the following things EVERYDAY: 1) Weigh yourself in the morning before breakfast. Write it down and keep it in a log. 2) Take your medicines as prescribed 3) Eat low salt foods-Limit salt (sodium) to 2000 mg per day.  4) Stay as active as you can everyday 5) Limit all fluids for the day to less than 2 liters

## 2014-07-31 NOTE — Progress Notes (Signed)
6MW completed during office visit today. Pt ambulated 1350 ft (~411.67m O2 sats ranged from 89-95% on room air, HR ranged from 100-129.

## 2014-07-31 NOTE — Progress Notes (Signed)
Patient ID: Darren Mueller, male   DOB: [birth year], 63 y.o.   MRN: [id number] GI : Dr Ardis Hughs PCP: DrBlyth  Subjective:    63 yo with history of COPD, portopulmonary hypertension with RV failure, and ETOH cirrhosis presents for cardiology followup.  [place] 2/14: RA = 13  RV = 63/5/14  PA = 65/24 (41)  PCW = 6  Hepatic wedge = 21  Fick cardiac output/index = 3.8/1.8  PVR = 9.1  FA sat = 94%  PA sat = 58%, 56%  SVC = 54%  PFTs 3/14 FEV1 2.02 L (58%) FVC 2.7L (54%) FEV1/FVC (89%) DLCO 53%  RHC 06/06/14 RA = 5  RV = 73/5/9  PA = 81/25 (46)  PCW = 7  Fick cardiac output/index = 6.0/2.8  PVR = 6.5 Woods  FA sat = 95%  PA sat = 71%, 67%  Unable to get hepatic wedge  Admitted in December 2014 with syncope in the setting of persistent diarrhea. C.diff and O&P negative.  Creatinine was up to 2.7 from 1.7 baseline. He was hydrated. Losartan and lasix stopped. Told to hold lasix unless weight 208 or greater. Event Monitor - NSR 10/26/13.   ECHO 12/14 EF 55-60% Peak PA pressure 35. Severe RV dysfunction  ECHO 7/15 EF 60% RV moderately to severely dilated. Moderate HK RVSP 31m HG   6 min walk 01/10/13, 1290 feet  6MW (01/10/14) = 1280 feet (380 m) 6MW (9/15) = 1350 feet (411 m)  Follow-up:  Last visit he was started on treprostinil (06/30/14)  and gradually up  titrating. He is currently on 0.125 mg and 0.25 mg in the evening.  Initially had numbness in feet and R hand. Numbness in feet has subsided. Has numbness/pain in R hand. PCP started sleep aide. Yesterday was able to walk around several hours. Drinking a couple mixed drinks per day. Not weighing daily. Denies presyncope/syncope.   10/19/13 Creatinine 1.79 K 4.1  3/15         Cr 1.78 K 4.2 03/17/14    CR 1.23 K 4.0 LFTs ok 06/06/14 K 4.0 Creatinine 1.4   Current Outpatient Prescriptions on File Prior to Encounter  Medication Sig Dispense Refill  . allopurinol (ZYLOPRIM) 100 MG tablet TAKE 1 TABLET DAILY  90 tablet  1  .  diphenoxylate-atropine (LOMOTIL) 2.5-0.025 MG per tablet Take 1 tablet by mouth 2 (two) times daily.  60 tablet  3  . Doxepin HCl 6 MG TABS Take 6 mg by mouth daily as needed (anxiety).      . furosemide (LASIX) 40 MG tablet Take 1 tablet (40 mg total) by mouth as directed. Every Mon and Fri  30 tablet    . hyoscyamine (SYMAX-SR) 0.375 MG 12 hr tablet Take 1 tablet (0.375 mg total) by mouth 2 (two) times daily.  180 tablet  0  . levothyroxine (SYNTHROID, LEVOTHROID) 25 MCG tablet TAKE 1 TABLET DAILY  90 tablet  1  . loperamide (ANTI-DIARRHEAL) 2 MG capsule Take 2 mg by mouth every 4 (four) hours as needed for diarrhea or loose stools.       .Marland Kitchenlosartan (COZAAR) 100 MG tablet Take 0.5 tablets (50 mg total) by mouth daily.  90 tablet  3  . Macitentan (OPSUMIT) 10 MG TABS Take 10 mg by mouth daily.  90 tablet  3  . mometasone-formoterol (DULERA) 200-5 MCG/ACT AERO Inhale 2 puffs into the lungs 2 (two) times daily.  13 g  6  . omeprazole (PRILOSEC) 40 MG capsule TAKE  1 CAPSULE DAILY  90 capsule  2  . potassium chloride (K-DUR) 10 MEQ tablet Take 2 tablets (20 mEq total) by mouth daily. Take extra 2 tabs on Mon and Fri  90 tablet  3  . spironolactone (ALDACTONE) 50 MG tablet Take 1 tablet (50 mg total) by mouth daily.  90 tablet  1  . Tadalafil, PAH, (ADCIRCA) 20 MG TABS Take 2 tablets (40 mg total) by mouth daily.  180 tablet  3  . temazepam (RESTORIL) 7.5 MG capsule Take 1 capsule (7.5 mg total) by mouth at bedtime as needed for sleep.  15 capsule  1  . Treprostinil Diolamine ER (ORENITRAM) 0.125 MG TBCR Take 0.125 mg by mouth 3 (three) times daily.  120 tablet    . triamcinolone cream (KENALOG) 0.1 % Apply 1 application topically 2 (two) times daily as needed. Dermatitis, 90 day supply, 3 tubes  85.2 g  1  . VERELAN 180 MG 24 hr capsule TAKE 1 CAPSULE AT BEDTIME  90 capsule  1   No current facility-administered medications on file prior to encounter.     Objective:   Weight Range:  Vital  Signs:   Pulse Rate:  [107] 107 (09/28 1122) BP: (128)/(90) 128/90 mmHg (09/28 1122) SpO2:  [92 %] 92 % (09/28 1122) Weight:  [218 lb 6.4 oz (99.066 kg)] 218 lb 6.4 oz (99.066 kg) (09/28 1122)    Weight change: Filed Weights   07/31/14 1122  Weight: 218 lb 6.4 oz (99.066 kg)    Intake/Output:  No intake or output data in the 24 hours ending 07/31/14 1140   Physical Exam: General: NAD  Neck: JVP ~8-9 cm with HJR, no thyromegaly or thyroid nodule.  Lungs: Clear to auscultation bilaterally with normal respiratory effort.  CV: Nondisplaced PMI. Heart regular  2/6 TR P2 LLSB with right-sided S3. No carotid bruit. Normal pedal pulses.  Abdomen: Soft, nontender, no hepatosplenomegaly, + distention.  Ext: warm No edema. No clubbing Skin: Intact without lesions or rashes.  Neurologic: Alert and oriented x 3.  Psych: Normal affect.  Extremities: No clubbing or cyanosis.  HEENT: Normal.   Labs: Basic Metabolic Panel: No results found for this basename: NA, K, CL, CO2, GLUCOSE, BUN, CREATININE, CALCIUM, MG, PHOS,  in the last 168 hours  Liver Function Tests: No results found for this basename: AST, ALT, ALKPHOS, BILITOT, PROT, ALBUMIN,  in the last 168 hours No results found for this basename: LIPASE, AMYLASE,  in the last 168 hours No results found for this basename: AMMONIA,  in the last 168 hours  CBC: No results found for this basename: WBC, NEUTROABS, HGB, HCT, MCV, PLT,  in the last 168 hours  Cardiac Enzymes: No results found for this basename: CKTOTAL, CKMB, CKMBINDEX, TROPONINI,  in the last 168 hours  BNP: BNP (last 3 results)  Recent Labs  08/29/13 0936 10/18/13 0205  PROBNP 1262.0* 619.8*     Other results:  :   Imaging: No results found.    Assessment/Plan    1. PAH - Suspected portopulmonary HTN.  Stable NYHA III on combination therapy - macitentan 10 daily, adcirca 40 mg daily, and now oral treprostinil.  6 minute walk today is improved.  He has RV  dysfunction on echo with volume overload noted on exam today.  - Suspect hand symptoms related to treprostinil.  Gradual titration is ongoing, hopefully over time the hand symptoms will improve.  - Start lasix 40 mg daily for 2 days and then [facility]  and an extra 20 meq Kdur  Mon-Wed-Fri 2. CKD - Most recent creatinine 1.4. Repeat BMET/BNP 10 days after starting Lasix.  3. ETOH cirrhosis - counseled on need to stop ETOH completely as this is likely contributing to [facility]. 4. HTN - BP stable. Can continue losartan for now.  CLEGG,AMY, NP-C  11:40 AM  Patient seen with NP, agree with the above note.  6 minute walk improved with treprostinil initiation.   RV failure with volume overload on exam.  Agree with starting Lasix as above.  Continue gradual titration of treprostinil.  Suspect hand pain is side effect of medication, hopefully will improve with time.    Loralie Champagne 07/31/2014

## 2014-08-08 ENCOUNTER — Ambulatory Visit (HOSPITAL_COMMUNITY)
Admission: RE | Admit: 2014-08-08 | Discharge: 2014-08-08 | Disposition: A | Discharge status: Home / Self Care | Source: Ambulatory Visit | Attending: Internal Medicine | Admitting: Internal Medicine

## 2014-08-08 DIAGNOSIS — I5022 Chronic systolic (congestive) heart failure: Secondary | ICD-10-CM

## 2014-08-08 DIAGNOSIS — IMO0002 Reserved for concepts with insufficient information to code with codable children: Secondary | ICD-10-CM

## 2014-08-08 DIAGNOSIS — I272 Other secondary pulmonary hypertension: Secondary | ICD-10-CM | POA: Insufficient documentation

## 2014-08-08 DIAGNOSIS — N183 Chronic kidney disease, stage 3 unspecified: Secondary | ICD-10-CM

## 2014-08-08 LAB — BASIC METABOLIC PANEL
ANION GAP: 14 (ref 5–15)
BUN: 20 mg/dL (ref 6–23)
CHLORIDE: 106 meq/L (ref 96–112)
CO2: 18 meq/L — AB (ref 19–32)
Calcium: 9.3 mg/dL (ref 8.4–10.5)
Creatinine, Ser: 1.64 mg/dL — ABNORMAL HIGH (ref 0.50–1.35)
GFR calc Af Amer: 49 mL/min — ABNORMAL LOW (ref >90)
GFR calc non Af Amer: 43 mL/min — ABNORMAL LOW (ref >90)
GLUCOSE: 103 mg/dL — AB (ref 70–99)
POTASSIUM: 4 meq/L (ref 3.7–5.3)
SODIUM: 138 meq/L (ref 137–147)

## 2014-08-08 LAB — PRO B NATRIURETIC PEPTIDE: PRO B NATRI PEPTIDE: 1109 pg/mL — AB (ref 0–125)

## 2014-08-09 ENCOUNTER — Other Ambulatory Visit: Payer: Self-pay | Admitting: Family Medicine

## 2014-08-09 NOTE — Telephone Encounter (Signed)
Rx request to pharmacy/[facility]  

## 2014-08-14 ENCOUNTER — Other Ambulatory Visit: Payer: Self-pay | Admitting: Family Medicine

## 2014-08-17 ENCOUNTER — Ambulatory Visit (HOSPITAL_COMMUNITY)
Admission: RE | Admit: 2014-08-17 | Discharge: 2014-08-17 | Disposition: A | Discharge status: Home / Self Care | Source: Ambulatory Visit | Attending: Internal Medicine | Admitting: Internal Medicine

## 2014-08-17 ENCOUNTER — Encounter (HOSPITAL_COMMUNITY): Payer: Self-pay

## 2014-08-17 VITALS — BP 121/77 | HR 103 | Resp 18 | Wt 216.8 lb

## 2014-08-17 DIAGNOSIS — K7030 Alcoholic cirrhosis of liver without ascites: Secondary | ICD-10-CM | POA: Insufficient documentation

## 2014-08-17 DIAGNOSIS — I509 Heart failure, unspecified: Secondary | ICD-10-CM

## 2014-08-17 DIAGNOSIS — I272 Other secondary pulmonary hypertension: Secondary | ICD-10-CM | POA: Insufficient documentation

## 2014-08-17 DIAGNOSIS — I10 Essential (primary) hypertension: Secondary | ICD-10-CM

## 2014-08-17 DIAGNOSIS — J449 Chronic obstructive pulmonary disease, unspecified: Secondary | ICD-10-CM | POA: Diagnosis not present

## 2014-08-17 DIAGNOSIS — I279 Pulmonary heart disease, unspecified: Secondary | ICD-10-CM

## 2014-08-17 DIAGNOSIS — K766 Portal hypertension: Secondary | ICD-10-CM

## 2014-08-17 DIAGNOSIS — I50810 Right heart failure, unspecified: Secondary | ICD-10-CM

## 2014-08-17 DIAGNOSIS — I2729 Other secondary pulmonary hypertension: Secondary | ICD-10-CM

## 2014-08-17 DIAGNOSIS — N189 Chronic kidney disease, unspecified: Secondary | ICD-10-CM | POA: Insufficient documentation

## 2014-08-17 DIAGNOSIS — I2721 Secondary pulmonary arterial hypertension: Secondary | ICD-10-CM

## 2014-08-17 LAB — BASIC METABOLIC PANEL
Anion gap: 13 (ref 5–15)
BUN: 20 mg/dL (ref 6–23)
CO2: 21 mEq/L (ref 19–32)
CREATININE: 1.44 mg/dL — AB (ref 0.50–1.35)
Calcium: 9.5 mg/dL (ref 8.4–10.5)
Chloride: 106 mEq/L (ref 96–112)
GFR calc non Af Amer: 50 mL/min — ABNORMAL LOW (ref >90)
GFR, EST AFRICAN AMERICAN: 58 mL/min — AB (ref >90)
Glucose, Bld: 106 mg/dL — ABNORMAL HIGH (ref 70–99)
POTASSIUM: 4 meq/L (ref 3.7–5.3)
SODIUM: 140 meq/L (ref 137–147)

## 2014-08-17 NOTE — Patient Instructions (Signed)
Labs today  Your physician recommends that you schedule a follow-up appointment in: 2 months    

## 2014-08-17 NOTE — Progress Notes (Signed)
Patient ID: Darren Mueller, male   DOB: Dec 24, 1950, 63 y.o.   MRN: [id number] GI : Dr Ardis Hughs PCP: DrBlyth  Subjective:    63 yo with history of COPD, portopulmonary hypertension with RV failure, and ETOH cirrhosis presents for cardiology followup.  [place] 2/14: RA = 13  RV = 63/5/14  PA = 65/24 (41)  PCW = 6  Hepatic wedge = 21  Fick cardiac output/index = 3.8/1.8  PVR = 9.1  FA sat = 94%  PA sat = 58%, 56%  SVC = 54%  PFTs 3/14 FEV1 2.02 L (58%) FVC 2.7L (54%) FEV1/FVC (89%) DLCO 53%  RHC 06/06/14 RA = 5  RV = 73/5/9  PA = 81/25 (46)  PCW = 7  Fick cardiac output/index = 6.0/2.8  PVR = 6.5 Woods  FA sat = 95%  PA sat = 71%, 67%  Unable to get hepatic wedge  Admitted in December 2014 with syncope in the setting of persistent diarrhea. C.diff and O&P negative.  Creatinine was up to 2.7 from 1.7 baseline. He was hydrated. Losartan and lasix stopped. Told to hold lasix unless weight 208 or greater. Event Monitor - NSR 10/26/13.   ECHO 12/14 EF 55-60% Peak PA pressure 35. Severe RV dysfunction  ECHO 7/15 EF 60% RV moderately to severely dilated. Moderate HK RVSP 70m HG  ECHO 07/31/14   6 min walk 01/10/13, 1290 feet  6MW (01/10/14) = 1280 feet (380 m) 6MW (9/15) = 1350 feet (411 m) O2 sats ranged from 89-95% on room air, HR ranged from 100-129.   Follow-up:  Started on treprostinil (06/30/14)  and gradually up titrating. He is currently on 0.875 mg TID.  Having some numbness in hands that comes and goes. Breathing is getting better.  Wears O2 as need. Not swelling. Trying to cut down on ETOH and went 1 week without drinking at all.  Denies presyncope/syncope. Taking lasix M/W/F. Weight down 2 pounds  10/19/13 Creatinine 1.79 K 4.1  3/15         Cr 1.78 K 4.2 03/17/14    CR 1.23 K 4.0 LFTs ok 06/06/14 K 4.0 Creatinine 1.4  08/08/14 K 4.0 Cr 1.6  Current Outpatient Prescriptions on File Prior to Encounter  Medication Sig Dispense Refill  . allopurinol (ZYLOPRIM) 100 MG tablet  TAKE 1 TABLET DAILY  90 tablet  1  . diphenoxylate-atropine (LOMOTIL) 2.5-0.025 MG per tablet Take 1 tablet by mouth 2 (two) times daily.  60 tablet  3  . Doxepin HCl 6 MG TABS Take 6 mg by mouth daily as needed (anxiety).      . furosemide (LASIX) 40 MG tablet Take 1 tablet (40 mg total) by mouth as directed. Every Mon, Wed, Fri  30 tablet  6  . levothyroxine (SYNTHROID, LEVOTHROID) 25 MCG tablet TAKE 1 TABLET DAILY  90 tablet  1  . loperamide (ANTI-DIARRHEAL) 2 MG capsule Take 2 mg by mouth every 4 (four) hours as needed for diarrhea or loose stools.       .Marland Kitchenlosartan (COZAAR) 100 MG tablet Take 0.5 tablets (50 mg total) by mouth daily.  90 tablet  3  . Macitentan (OPSUMIT) 10 MG TABS Take 10 mg by mouth daily.  90 tablet  3  . mometasone-formoterol (DULERA) 200-5 MCG/ACT AERO Inhale 2 puffs into the lungs 2 (two) times daily.  13 g  6  . omeprazole (PRILOSEC) 40 MG capsule TAKE 1 CAPSULE DAILY  90 capsule  2  . potassium chloride (K-DUR) 10  MEQ tablet Take 2 tablets (20 mEq total) by mouth daily. Take extra 2 tabs on Mon and Fri  90 tablet  3  . spironolactone (ALDACTONE) 50 MG tablet TAKE 1 TABLET DAILY  90 tablet  0  . SYMAX-SR 0.375 MG 12 hr tablet TAKE 1 TABLET TWICE A DAY  180 tablet  0  . Tadalafil, PAH, (ADCIRCA) 20 MG TABS Take 2 tablets (40 mg total) by mouth daily.  180 tablet  3  . temazepam (RESTORIL) 7.5 MG capsule Take 1 capsule (7.5 mg total) by mouth at bedtime as needed for sleep.  15 capsule  1  . triamcinolone cream (KENALOG) 0.1 % Apply 1 application topically 2 (two) times daily as needed. Dermatitis, 90 day supply, 3 tubes  85.2 g  1  . VERELAN 180 MG 24 hr capsule TAKE 1 CAPSULE AT BEDTIME  90 capsule  1   No current facility-administered medications on file prior to encounter.     Objective:   Weight Range:  Vital Signs:   Pulse Rate:  [103] 103 (10/15 1039) Resp:  [18] 18 (10/15 1039) BP: (121)/(77) 121/77 mmHg (10/15 1039) SpO2:  [99 %] 99 % (10/15  1039) Weight:  [216 lb 12 oz (98.317 kg)] 216 lb 12 oz (98.317 kg) (10/15 1039)    Weight change: Filed Weights   08/17/14 1039  Weight: 216 lb 12 oz (98.317 kg)    Intake/Output:  No intake or output data in the 24 hours ending 08/17/14 1100   Physical Exam: General: NAD  Neck: JVP ~6 cm with HJR, no thyromegaly or thyroid nodule.  Lungs: Clear to auscultation bilaterally with normal respiratory effort.  CV: Nondisplaced PMI. Heart regular  2/6 TR P2 LLSB with right-sided S3. No carotid bruit. Normal pedal pulses.  Abdomen: Soft, nontender, no hepatosplenomegaly, distention.  Ext: warm No edema. No clubbing Skin: Intact without lesions or rashes.  Neurologic: Alert and oriented x 3.  Psych: Normal affect.  Extremities: No clubbing or cyanosis.  HEENT: Normal.   Labs: Basic Metabolic Panel: No results found for this basename: NA, K, CL, CO2, GLUCOSE, BUN, CREATININE, CALCIUM, MG, PHOS,  in the last 168 hours  Liver Function Tests: No results found for this basename: AST, ALT, ALKPHOS, BILITOT, PROT, ALBUMIN,  in the last 168 hours No results found for this basename: LIPASE, AMYLASE,  in the last 168 hours No results found for this basename: AMMONIA,  in the last 168 hours  CBC: No results found for this basename: WBC, NEUTROABS, HGB, HCT, MCV, PLT,  in the last 168 hours  Cardiac Enzymes: No results found for this basename: CKTOTAL, CKMB, CKMBINDEX, TROPONINI,  in the last 168 hours  BNP: BNP (last 3 results)  Recent Labs  08/29/13 0936 10/18/13 0205 08/08/14 1115  PROBNP 1262.0* 619.8* 1109.0*     Other results:  :   Imaging: No results found.    Assessment/Plan    1. PAH - Suspected portopulmonary HTN.  Stable NYHA III on combination therapy - macitentan 10 daily, adcirca 40 mg daily, and now oral treprostinil.  6 minute walk last month is improved.    - Continue to titrate treprostinil. - Volume status better with lasix. Continue Mon-Wed-Fri and an  extra 20 meq Kdur  Mon-Wed-Fri - Echo in 3-6 months to assess response to treprostinil  - I have encouraged him to get a pulse oximeter to follow O2 sats and use O2 as need to keep them >= 90% at all times  2. CKD - Most recent creatinine 1.4. Repeat BMET 3. ETOH cirrhosis - counseled on need to stop ETOH completely as this is likely contributing to [facility]. 4. HTN - BP stable. Continue losartan.  Glori Bickers, MD 11:00 AM

## 2014-08-17 NOTE — Addendum Note (Signed)
Encounter addended by: Kerry Dory, CMA on: 08/17/2014 11:28 AM<BR>     Documentation filed: Patient Instructions Section, Orders

## 2014-08-18 ENCOUNTER — Telehealth (HOSPITAL_COMMUNITY): Payer: Self-pay

## 2014-08-18 NOTE — Telephone Encounter (Signed)
Called patient regarding entrance to Pulmonary Rehab.  Patient states that they are interested in attending the program.  Maher is going to verify insurance coverage and follow up.

## 2014-08-25 ENCOUNTER — Telehealth (HOSPITAL_COMMUNITY): Payer: Self-pay | Admitting: *Deleted

## 2014-08-25 NOTE — Telephone Encounter (Signed)
Completed form from [employer] ([employer]) for PA for pt's pulmonary rehab and faxed form back on 08/23/14, Pulm rehab was approved from 08/23/14-11/21/14, ref number [id number], pulmonary rehab aware and order faxed to them along with copy of approval

## 2014-08-31 ENCOUNTER — Telehealth (HOSPITAL_COMMUNITY): Payer: Self-pay | Admitting: *Deleted

## 2014-09-08 ENCOUNTER — Encounter (HOSPITAL_COMMUNITY): Payer: Self-pay

## 2014-09-08 ENCOUNTER — Encounter (HOSPITAL_COMMUNITY)
Admission: RE | Admit: 2014-09-08 | Discharge: 2014-09-08 | Disposition: A | Discharge status: Home / Self Care | Source: Ambulatory Visit | Attending: Cardiology | Admitting: Cardiology

## 2014-09-08 VITALS — BP 121/79 | HR 89 | Resp 18 | Ht 72.0 in | Wt 214.1 lb

## 2014-09-08 DIAGNOSIS — I272 Other secondary pulmonary hypertension: Secondary | ICD-10-CM | POA: Insufficient documentation

## 2014-09-08 DIAGNOSIS — Z5189 Encounter for other specified aftercare: Secondary | ICD-10-CM | POA: Insufficient documentation

## 2014-09-08 DIAGNOSIS — Z87891 Personal history of nicotine dependence: Secondary | ICD-10-CM | POA: Diagnosis not present

## 2014-09-08 NOTE — Progress Notes (Signed)
Darren Mueller 63 y.o. male Pulmonary Rehab Orientation Note Patient arrived today in Cardiac and Pulmonary Rehab for orientation to Pulmonary Rehab. He ambulated from [employer] with minimal SOB. He stated he ambulated at a slow pace and did not take a rest break. He does carry portable oxygen, however he did not bring it with him to his appointment today. His sats on arrival to the department were 94% and increased to 97% with rest.Per pt, he uses oxygen occasionally. Color good, skin warm and dry. Patient is oriented to time and place. Patient's medical history and medications reviewed. Heart rate is tachycardic at 107 but slows to 89 with rest, breath sounds clear to auscultation, no wheezes, rales, or rhonchi. Grip strength equal, strong. Distal pulses palpable. Mild ankle edema bilat. Patient reports he does take medications as prescribed. Patient states he follows a Low Sodium diet. The patient reports no specific efforts to gain or lose weight. Patient's weight will be monitored closely. Demonstration and practice of PLB using pulse oximeter. Patient able to return demonstration satisfactorily. Safety and hand hygiene in the exercise area reviewed with patient. Patient voices understanding of the information reviewed. Department expectations discussed with patient and achievable goals were set. The patient shows enthusiasm about attending the program and we look forward to working with this nice gentleman. The patient is scheduled for a 6 min walk test on Tuesday 11/17 and to begin exercise on Thursday 11/19 at 1030.   45 minutes was spent on a variety of activities such as assessment of the patient, obtaining baseline data including height, weight, BMI, and grip strength, verifying medical history, allergies, and current medications, and teaching patient strategies for performing tasks with less respiratory effort with emphasis on pursed lip breathing.

## 2014-09-12 ENCOUNTER — Encounter: Payer: Self-pay | Admitting: Family Medicine

## 2014-09-12 ENCOUNTER — Ambulatory Visit (INDEPENDENT_AMBULATORY_CARE_PROVIDER_SITE_OTHER): Admitting: Family Medicine

## 2014-09-12 VITALS — BP 118/85 | HR 93 | Temp 98.0°F | Ht 72.0 in | Wt 214.0 lb

## 2014-09-12 DIAGNOSIS — E039 Hypothyroidism, unspecified: Secondary | ICD-10-CM

## 2014-09-12 DIAGNOSIS — K219 Gastro-esophageal reflux disease without esophagitis: Secondary | ICD-10-CM

## 2014-09-12 DIAGNOSIS — I509 Heart failure, unspecified: Secondary | ICD-10-CM

## 2014-09-12 DIAGNOSIS — K766 Portal hypertension: Secondary | ICD-10-CM

## 2014-09-12 DIAGNOSIS — G4700 Insomnia, unspecified: Secondary | ICD-10-CM

## 2014-09-12 DIAGNOSIS — I10 Essential (primary) hypertension: Secondary | ICD-10-CM

## 2014-09-12 DIAGNOSIS — N289 Disorder of kidney and ureter, unspecified: Secondary | ICD-10-CM

## 2014-09-12 DIAGNOSIS — I279 Pulmonary heart disease, unspecified: Secondary | ICD-10-CM

## 2014-09-12 DIAGNOSIS — I2721 Secondary pulmonary arterial hypertension: Secondary | ICD-10-CM

## 2014-09-12 MED ORDER — LOSARTAN POTASSIUM 50 MG PO TABS: 50.0000 mg | ORAL_TABLET | Freq: Every day | ORAL | Status: DC

## 2014-09-12 NOTE — Progress Notes (Signed)
Pre visit review using our clinic review tool, if applicable. No additional management support is needed unless otherwise documented below in the visit note.

## 2014-09-12 NOTE — Patient Instructions (Signed)

## 2014-09-13 LAB — RENAL FUNCTION PANEL
Albumin: 3.7 g/dL (ref 3.5–5.2)
BUN: 18 mg/dL (ref 6–23)
CO2: 22 meq/L (ref 19–32)
CREATININE: 1.8 mg/dL — AB (ref 0.4–1.5)
Calcium: 9.4 mg/dL (ref 8.4–10.5)
Chloride: 111 mEq/L (ref 96–112)
GFR: 50.62 mL/min — ABNORMAL LOW (ref >60.00)
GLUCOSE: 98 mg/dL (ref 70–99)
Phosphorus: 2.4 mg/dL (ref 2.3–4.6)
Potassium: 4.3 mEq/L (ref 3.5–5.1)
SODIUM: 140 meq/L (ref 135–145)

## 2014-09-13 LAB — CBC
HEMATOCRIT: 39.8 % (ref 39.0–52.0)
Hemoglobin: 13.4 g/dL (ref 13.0–17.0)
MCHC: 33.6 g/dL (ref 30.0–36.0)
MCV: 104.2 fl — AB (ref 78.0–100.0)
Platelets: 58 10*3/uL — ABNORMAL LOW (ref 150.0–400.0)
RBC: 3.82 Mil/uL — ABNORMAL LOW (ref 4.22–5.81)
RDW: 14.8 % (ref 11.5–15.5)
WBC: 3.8 10*3/uL — ABNORMAL LOW (ref 4.0–10.5)

## 2014-09-13 LAB — HEPATIC FUNCTION PANEL
ALBUMIN: 3.7 g/dL (ref 3.5–5.2)
ALT: 30 U/L (ref 0–53)
AST: 40 U/L — AB (ref 0–37)
Alkaline Phosphatase: 114 U/L (ref 39–117)
BILIRUBIN TOTAL: 1 mg/dL (ref 0.2–1.2)
Bilirubin, Direct: 0.3 mg/dL (ref 0.0–0.3)
Total Protein: 7.9 g/dL (ref 6.0–8.3)

## 2014-09-13 LAB — TSH: TSH: 1.64 u[IU]/mL (ref 0.35–4.50)

## 2014-09-17 NOTE — Assessment & Plan Note (Signed)
Well controlled, no changes to meds. Encouraged heart healthy diet such as the DASH diet and exercise as tolerated.

## 2014-09-17 NOTE — Assessment & Plan Note (Signed)
Avoid offending foods, start probiotics. Do not eat large meals in late evening and consider raising head of bed.  

## 2014-09-17 NOTE — Assessment & Plan Note (Signed)
Recently had meds changed but does not note any changes.

## 2014-09-17 NOTE — Progress Notes (Signed)
Darren Mueller  [id number] Oct 20, 1951 09/17/2014      Progress Note-Follow Up  Subjective  Chief Complaint  Chief Complaint  Patient presents with  . Follow-up    7 week    HPI  Patient is a 63 y.o. male in today for routine medical care. Patient is in today for follow up. Is doing well but continues to struggle with dyspnea, it is unchanged despite recent change in meds. Has had a good response to Melatonin and is sleeping better. No new infectious disease. No fevers. Denies CP/palp/SOB/HA/congestion/fevers/GI or GU c/o. Taking meds as prescribed  Past Medical History  Diagnosis Date  . Hypertension   . Seizures   . Hx of colonic polyps   . GERD (gastroesophageal reflux disease)   . Gout   . Alcoholic cirrhosis of liver with ascites 2014  . Unspecified hypothyroidism 01/25/2013  . Unspecified pleural effusion   . Other chronic pulmonary heart diseases   . Hypopotassemia   . Renal insufficiency 07/18/2013  . Diarrhea 09/04/2013  . Otalgia 11/28/2013    Past Surgical History  Procedure Laterality Date  . Colonoscopy N/A 12/08/2013    Procedure: COLONOSCOPY;  Surgeon: Milus Banister, MD;  Location: [facility];  Service: Endoscopy;  Laterality: N/A;    Family History  Problem Relation Age of Onset  . Heart disease Mother   . Prostate cancer Father   . Colon cancer Neg Hx   . Heart attack Mother   . Hypertension Mother     History   Social History  . Marital Status: Divorced    Spouse Name: N/A    Number of Children: 3  . Years of Education: N/A   Occupational History  . Retired     Social research officer, government   Social History Main Topics  . Smoking status: Former Smoker -- 2.00 packs/day for 5 years    Types: Cigarettes    Quit date: 09/22/1979  . Smokeless tobacco: Never Used  . Alcohol Use: Yes     Comment: 3-4 cans of beers and 4-5 drinks of vodka - has not drunk any alcohol in the last few weeks  . Drug Use: No  . Sexual Activity: Not on file   Other Topics  Concern  . Not on file   Social History Narrative   0 caffeine drinks daily     Current Outpatient Prescriptions on File Prior to Visit  Medication Sig Dispense Refill  . allopurinol (ZYLOPRIM) 100 MG tablet TAKE 1 TABLET DAILY 90 tablet 1  . diphenoxylate-atropine (LOMOTIL) 2.5-0.025 MG per tablet Take 1 tablet by mouth 2 (two) times daily. 60 tablet 3  . Doxepin HCl 6 MG TABS Take 6 mg by mouth daily as needed (anxiety).    . furosemide (LASIX) 40 MG tablet Take 1 tablet (40 mg total) by mouth as directed. Every Mon, Wed, Fri 30 tablet 6  . levothyroxine (SYNTHROID, LEVOTHROID) 25 MCG tablet TAKE 1 TABLET DAILY 90 tablet 1  . loperamide (ANTI-DIARRHEAL) 2 MG capsule Take 2 mg by mouth every 4 (four) hours as needed for diarrhea or loose stools.     . Macitentan (OPSUMIT) 10 MG TABS Take 10 mg by mouth daily. 90 tablet 3  . mometasone-formoterol (DULERA) 200-5 MCG/ACT AERO Inhale 2 puffs into the lungs 2 (two) times daily. 13 g 6  . omeprazole (PRILOSEC) 40 MG capsule TAKE 1 CAPSULE DAILY 90 capsule 2  . potassium chloride (K-DUR) 10 MEQ tablet Take 2 tablets (20 mEq total) by  mouth daily. Take extra 2 tabs on Mon and Fri 90 tablet 3  . spironolactone (ALDACTONE) 50 MG tablet TAKE 1 TABLET DAILY 90 tablet 0  . SYMAX-SR 0.375 MG 12 hr tablet TAKE 1 TABLET TWICE A DAY 180 tablet 0  . Tadalafil, PAH, (ADCIRCA) 20 MG TABS Take 2 tablets (40 mg total) by mouth daily. 180 tablet 3  . Treprostinil Diolamine ER 0.125 MG TBCR Take 0.375 mg by mouth 3 (three) times daily.    Marland Kitchen triamcinolone cream (KENALOG) 0.1 % Apply 1 application topically 2 (two) times daily as needed. Dermatitis, 90 day supply, 3 tubes 85.2 g 1  . VERELAN 180 MG 24 hr capsule TAKE 1 CAPSULE AT BEDTIME 90 capsule 1   No current facility-administered medications on file prior to visit.    Allergies  Allergen Reactions  . Aspirin Other (See Comments)    hypertension    Review of Systems  ROS  Objective  BP 118/85  mmHg  Pulse 93  Temp(Src) 98 F (36.7 C) (Oral)  Ht 6' (1.829 m)  Wt 214 lb (97.07 kg)  BMI 29.02 kg/m2  SpO2 97%  Physical Exam  Physical Exam  Lab Results  Component Value Date   TSH 1.64 09/12/2014   Lab Results  Component Value Date   WBC 3.8* 09/12/2014   HGB 13.4 09/12/2014   HCT 39.8 09/12/2014   MCV 104.2* 09/12/2014   PLT 58.0 Repeated and verified X2.* 09/12/2014   Lab Results  Component Value Date   CREATININE 1.8* 09/12/2014   BUN 18 09/12/2014   NA 140 09/12/2014   K 4.3 09/12/2014   CL 111 09/12/2014   CO2 22 09/12/2014   Lab Results  Component Value Date   ALT 30 09/12/2014   AST 40* 09/12/2014   ALKPHOS 114 09/12/2014   BILITOT 1.0 09/12/2014   Lab Results  Component Value Date   CHOL 196 03/17/2014   Lab Results  Component Value Date   HDL 65 03/17/2014   Lab Results  Component Value Date   LDLCALC 105* 03/17/2014   Lab Results  Component Value Date   TRIG 128 03/17/2014   Lab Results  Component Value Date   CHOLHDL 3.0 03/17/2014     Assessment & Plan  Hypertension Well controlled, no changes to meds. Encouraged heart healthy diet such as the DASH diet and exercise as tolerated.   PAH (pulmonary arterial hypertension) with portal hypertension Recently had meds changed but does not note any changes.   Insomnia Encouraged good sleep hygiene such as dark, quiet room. No blue/green glowing lights such as computer screens in bedroom. No alcohol or stimulants in evening. Cut down on caffeine as able. Regular exercise is helpful but not just prior to bed time. Good response to Melatonin 6 mg continue same  GERD (gastroesophageal reflux disease) Avoid offending foods, start probiotics. Do not eat large meals in late evening and consider raising head of bed.

## 2014-09-17 NOTE — Assessment & Plan Note (Signed)
Encouraged good sleep hygiene such as dark, quiet room. No blue/green glowing lights such as computer screens in bedroom. No alcohol or stimulants in evening. Cut down on caffeine as able. Regular exercise is helpful but not just prior to bed time. Good response to Melatonin 6 mg continue same

## 2014-09-19 ENCOUNTER — Encounter (HOSPITAL_COMMUNITY)
Admission: RE | Admit: 2014-09-19 | Discharge: 2014-09-19 | Disposition: A | Discharge status: Home / Self Care | Source: Ambulatory Visit | Attending: Internal Medicine | Admitting: Internal Medicine

## 2014-09-19 DIAGNOSIS — Z5189 Encounter for other specified aftercare: Secondary | ICD-10-CM | POA: Diagnosis not present

## 2014-09-19 NOTE — Progress Notes (Signed)
Darren Mueller completed a Six-Minute Walk Test on 09/19/14 . Darren Mueller walked 1,217 feet with 0 breaks.  The patient's lowest oxygen saturation was 94%, highest heart rate was 125bpm , and highest blood pressure was 132/72. The patient was on room air. Darren Mueller stated that nothing hindered his walk test.

## 2014-09-21 ENCOUNTER — Encounter (HOSPITAL_COMMUNITY)
Admission: RE | Admit: 2014-09-21 | Discharge: 2014-09-21 | Disposition: A | Discharge status: Home / Self Care | Source: Ambulatory Visit | Attending: Cardiology | Admitting: Cardiology

## 2014-09-21 DIAGNOSIS — Z5189 Encounter for other specified aftercare: Secondary | ICD-10-CM | POA: Diagnosis not present

## 2014-09-21 NOTE — Progress Notes (Signed)
Today, Darren Mueller exercised at [employer]. [facility]. Service time was from 1030 to 1230.  The patient exercised for more than 31 minutes performing aerobic, strengthening, and stretching exercises. Oxygen saturation, heart rate, blood pressure, rate of perceived exertion, and shortness of breath were all monitored before, during, and after exercise. Darren Mueller presented with no problems at today's exercise session. Darren Mueller also attended an education session on exercise for the pulmonary patient.  There was no workload change during today's exercise session.  Pre-exercise vitals: . Weight kg: 98.6 . Liters of O2: ra . SpO2: 96 . HR: 77 . BP: 98/60 . CBG: na  Exercise vitals: . Highest heartrate:  106 . Lowest oxygen saturation: 93 . Highest blood pressure: 128/68 . Liters of 02: ra  Post-exercise vitals: . SpO2: 97 . HR: 77 . BP: 104/64 . Liters of O2: ra . CBG: na  Dr. Brand Males, Medical Director Dr. Wynelle Cleveland is immediately available during today's Pulmonary Rehab session for [facility] on 09/21/2014 at 1030 class time.

## 2014-09-25 ENCOUNTER — Telehealth (HOSPITAL_COMMUNITY): Payer: Self-pay | Admitting: Vascular Surgery

## 2014-09-25 ENCOUNTER — Other Ambulatory Visit: Payer: Self-pay | Admitting: Cardiology

## 2014-09-25 MED ORDER — TADALAFIL (PAH) 20 MG PO TABS: 40.0000 mg | ORAL_TABLET | Freq: Every day | ORAL | Status: DC

## 2014-09-25 NOTE — Telephone Encounter (Signed)
As requested meds sent into pharmacy

## 2014-09-25 NOTE — Telephone Encounter (Signed)
Pt called he needs a 14 day refill Tadalafil called in to the [facility] outpatient pharmacy until [facility]

## 2014-09-25 NOTE — Telephone Encounter (Signed)
REFILL 

## 2014-09-26 ENCOUNTER — Encounter (HOSPITAL_COMMUNITY)
Admission: RE | Admit: 2014-09-26 | Discharge: 2014-09-26 | Disposition: A | Discharge status: Home / Self Care | Source: Ambulatory Visit | Attending: Cardiology | Admitting: Cardiology

## 2014-09-26 DIAGNOSIS — Z5189 Encounter for other specified aftercare: Secondary | ICD-10-CM | POA: Diagnosis not present

## 2014-09-26 NOTE — Progress Notes (Signed)
Today, Darren Mueller exercised at [employer]. Service time was from 1030 to 1210.  The patient exercised for more than 31 minutes performing aerobic, strengthening, and stretching exercises. Oxygen saturation, heart rate, blood pressure, rate of perceived exertion, and shortness of breath were all monitored before, during, and after exercise. Darren Mueller presented with no problems at today's exercise session.   There was one workload change during today's exercise session.  Pre-exercise vitals: . Weight kg: 95.1 . Liters of O2: RA . SpO2: 96 . HR: 80 . BP: 112/70 . CBG: na  Exercise vitals: . Highest heartrate:  122 which was over his target heart rate while on the treadmill.  His speed was decreased to 1.0 on the treadmill and his heart rate decreased to 114 after resting.   . Lowest oxygen saturation: 92 . Highest blood pressure: 126/70 . Liters of 02: RA  Post-exercise vitals: . SpO2: 97 . HR: 89 . BP: 92/62 . Liters of O2: RA . CBG: NA Dr. Brand Males, Medical Director Dr. Aileen Fass is immediately available during today's Pulmonary Rehab session for [facility] on 09/26/2014 at 1030 class time.  Marland Kitchen

## 2014-09-28 ENCOUNTER — Encounter (HOSPITAL_COMMUNITY)

## 2014-10-02 ENCOUNTER — Telehealth (HOSPITAL_COMMUNITY): Payer: Self-pay | Admitting: Vascular Surgery

## 2014-10-02 NOTE — Telephone Encounter (Signed)
Pt called he had an allergic reaction to one of his medications on Thanksgiving day .Marland Kitchen I couldn't understand the name of the medication due to the pt mumbling.. Please advise

## 2014-10-02 NOTE — Telephone Encounter (Signed)
Spoke w/pt, he was started on Orenitram in Aug and has been doing ok until his dose was increased 11/21, he states after that he developed bad headaches and his face turned very red and stayed that way, he states he tried Tylenol but this did not help, he has not taken medication since Friday and reports redness and headache are both gone, advised to restart med at previous dose and continue that until his next appt 12/10, if symptoms return call us back

## 2014-10-03 ENCOUNTER — Encounter (HOSPITAL_COMMUNITY)
Admission: RE | Admit: 2014-10-03 | Discharge: 2014-10-03 | Disposition: A | Discharge status: Home / Self Care | Source: Ambulatory Visit | Attending: Cardiology | Admitting: Cardiology

## 2014-10-03 DIAGNOSIS — I272 Other secondary pulmonary hypertension: Secondary | ICD-10-CM | POA: Insufficient documentation

## 2014-10-03 DIAGNOSIS — Z5189 Encounter for other specified aftercare: Secondary | ICD-10-CM | POA: Diagnosis present

## 2014-10-03 DIAGNOSIS — Z87891 Personal history of nicotine dependence: Secondary | ICD-10-CM | POA: Insufficient documentation

## 2014-10-03 NOTE — Progress Notes (Signed)
Today, Darren Mueller exercised at [employer]. Service time was from 10:30am to 12:15pm.  The patient exercised for more than 31 minutes performing aerobic, strengthening, and stretching exercises. Oxygen saturation, heart rate, blood pressure, rate of perceived exertion, and shortness of breath were all monitored before, during, and after exercise. [name] presented with no problems at today's exercise session.   There was no workload change during today's exercise session.  Pre-exercise vitals: . Weight kg: 95.5 . Liters of O2: ra . SpO2: 98 . HR: 97 . BP: 124/66 . CBG: na  Exercise vitals: . Highest heartrate:  117 . Lowest oxygen saturation: 93 . Highest blood pressure: 128/80 . Liters of 02: ra  Post-exercise vitals: . SpO2: 98 . HR: 101 . BP: 116/60 . Liters of O2: ra . CBG: na  Dr. Brand Males, Medical Director Dr. Waldron Labs is immediately available during today's Pulmonary Rehab session for [facility] on 10/03/14 at 10:30am class time.

## 2014-10-05 ENCOUNTER — Encounter (HOSPITAL_COMMUNITY)
Admission: RE | Admit: 2014-10-05 | Discharge: 2014-10-05 | Disposition: A | Discharge status: Home / Self Care | Source: Ambulatory Visit | Attending: Cardiology | Admitting: Cardiology

## 2014-10-05 DIAGNOSIS — Z5189 Encounter for other specified aftercare: Secondary | ICD-10-CM | POA: Diagnosis not present

## 2014-10-05 NOTE — Progress Notes (Addendum)
Today, Fabricio exercised at [employer]. Service time was from 1030 to 1225.  The patient exercised for more than 31 minutes performing aerobic, strengthening, and stretching exercises. Oxygen saturation, heart rate, blood pressure, rate of perceived exertion, and shortness of breath were all monitored before, during, and after exercise. Darren Mueller presented with no problems at today's exercise session. Darren Mueller also attended an education session on pulmonary medications.  There was a workload change during today's exercise session.  Pre-exercise vitals: . Weight kg: 94.6 . Liters of O2: ra . SpO2: 97 . HR: 87 . BP: 108/70 . CBG: na  Exercise vitals: . Highest heartrate:  125 down to 93 with rest break  . Lowest oxygen saturation: 94 . Highest blood pressure: 152/84 . Liters of 02: ra  Post-exercise vitals: . SpO2: 98 . HR: 89 . BP: 108/70 . Liters of O2: ra . CBG: na  Dr. Brand Males, Medical Director Dr. Tana Coast is immediately available during today's Pulmonary Rehab session for [facility] on 10/05/2014 at 1030 class time.

## 2014-10-10 ENCOUNTER — Encounter (HOSPITAL_COMMUNITY)
Admission: RE | Admit: 2014-10-10 | Discharge: 2014-10-10 | Disposition: A | Discharge status: Home / Self Care | Source: Ambulatory Visit | Attending: Cardiology | Admitting: Cardiology

## 2014-10-10 DIAGNOSIS — Z5189 Encounter for other specified aftercare: Secondary | ICD-10-CM | POA: Diagnosis not present

## 2014-10-10 NOTE — Progress Notes (Signed)
Today, Reyn exercised at [employer]. Service time was from 10:30am to 12:00pm.  The patient exercised for more than 31 minutes performing aerobic, strengthening, and stretching exercises. Oxygen saturation, heart rate, blood pressure, rate of perceived exertion, and shortness of breath were all monitored before, during, and after exercise. Darren Mueller presented with no problems at today's exercise session.   There was an increase in workload change during today's exercise session.  Pre-exercise vitals: . Weight kg: 95.4 . Liters of O2: ra . SpO2: 100 . HR: 86 . BP: 126/64 . CBG: na  Exercise vitals: . Highest heartrate:  127 . Lowest oxygen saturation: 91 . Highest blood pressure: 130/84 . Liters of 02: ra  Post-exercise vitals: . SpO2: 97 . HR: 88 . BP: 128/82 . Liters of O2: ra . CBG: na  Dr. Brand Males, Medical Director Dr. Tana Coast is immediately available during today's Pulmonary Rehab session for [facility] on 10/10/14 at 10:30am class time.

## 2014-10-12 ENCOUNTER — Ambulatory Visit (HOSPITAL_COMMUNITY)
Admission: RE | Admit: 2014-10-12 | Discharge: 2014-10-12 | Disposition: A | Discharge status: Home / Self Care | Source: Ambulatory Visit | Attending: Internal Medicine | Admitting: Internal Medicine

## 2014-10-12 ENCOUNTER — Encounter (HOSPITAL_COMMUNITY): Payer: Self-pay

## 2014-10-12 ENCOUNTER — Encounter (HOSPITAL_COMMUNITY)

## 2014-10-12 VITALS — BP 142/80 | HR 98 | Wt 212.8 lb

## 2014-10-12 DIAGNOSIS — I272 Other secondary pulmonary hypertension: Secondary | ICD-10-CM

## 2014-10-12 DIAGNOSIS — I129 Hypertensive chronic kidney disease with stage 1 through stage 4 chronic kidney disease, or unspecified chronic kidney disease: Secondary | ICD-10-CM | POA: Diagnosis not present

## 2014-10-12 DIAGNOSIS — I509 Heart failure, unspecified: Secondary | ICD-10-CM

## 2014-10-12 DIAGNOSIS — Z79899 Other long term (current) drug therapy: Secondary | ICD-10-CM | POA: Insufficient documentation

## 2014-10-12 DIAGNOSIS — J449 Chronic obstructive pulmonary disease, unspecified: Secondary | ICD-10-CM | POA: Insufficient documentation

## 2014-10-12 DIAGNOSIS — K766 Portal hypertension: Secondary | ICD-10-CM | POA: Insufficient documentation

## 2014-10-12 DIAGNOSIS — I2729 Other secondary pulmonary hypertension: Secondary | ICD-10-CM

## 2014-10-12 DIAGNOSIS — I50810 Right heart failure, unspecified: Secondary | ICD-10-CM

## 2014-10-12 DIAGNOSIS — N183 Chronic kidney disease, stage 3 unspecified: Secondary | ICD-10-CM

## 2014-10-12 DIAGNOSIS — N182 Chronic kidney disease, stage 2 (mild): Secondary | ICD-10-CM | POA: Insufficient documentation

## 2014-10-12 DIAGNOSIS — N189 Chronic kidney disease, unspecified: Secondary | ICD-10-CM

## 2014-10-12 DIAGNOSIS — K7030 Alcoholic cirrhosis of liver without ascites: Secondary | ICD-10-CM | POA: Diagnosis not present

## 2014-10-12 LAB — BASIC METABOLIC PANEL
ANION GAP: 15 (ref 5–15)
BUN: 21 mg/dL (ref 6–23)
CALCIUM: 9.3 mg/dL (ref 8.4–10.5)
CO2: 16 mEq/L — ABNORMAL LOW (ref 19–32)
Chloride: 109 mEq/L (ref 96–112)
Creatinine, Ser: 1.15 mg/dL (ref 0.50–1.35)
GFR calc Af Amer: 76 mL/min — ABNORMAL LOW (ref >90)
GFR calc non Af Amer: 65 mL/min — ABNORMAL LOW (ref >90)
GLUCOSE: 111 mg/dL — AB (ref 70–99)
POTASSIUM: 4.3 meq/L (ref 3.7–5.3)
SODIUM: 140 meq/L (ref 137–147)

## 2014-10-12 MED ORDER — TREPROSTINIL DIOLAMINE ER 0.125 MG PO TBCR: 0.2500 mg | EXTENDED_RELEASE_TABLET | Freq: Three times a day (TID) | ORAL | Status: DC

## 2014-10-12 NOTE — Progress Notes (Signed)
Patient ID: Darren Mueller, male   DOB: 1951-06-08, 63 y.o.   MRN: [id number] GI : Dr Ardis Hughs PCP: DrBlyth  Subjective:    Darren Mueller is a 63 yo with history of COPD, portopulmonary hypertension with RV failure, and ETOH cirrhosis presents for followup of his PAH.  Hodgeman 2/14: RA = 13  RV = 63/5/14  PA = 65/24 (41)  PCW = 6  Hepatic wedge = 21  Fick cardiac output/index = 3.8/1.8  PVR = 9.1  FA sat = 94%  PA sat = 58%, 56%  SVC = 54%  PFTs 3/14 FEV1 2.02 L (58%) FVC 2.7L (54%) FEV1/FVC (89%) DLCO 53%  RHC 06/06/14 RA = 5  RV = 73/5/9  PA = 81/25 (46)  PCW = 7  Fick cardiac output/index = 6.0/2.8  PVR = 6.5 [place]  FA sat = 95%  PA sat = 71%, 67%  Unable to get hepatic wedge  Admitted in December 2014 with syncope in the setting of persistent diarrhea. C.diff and O&P negative.  Creatinine was up to 2.7 from 1.7 baseline. He was hydrated. Losartan and lasix stopped.. Event Monitor - NSR 10/26/13.   ECHO 12/14 EF 55-60% Peak PA pressure 35. Severe RV dysfunction  ECHO 7/15 EF 60% RV moderately to severely dilated. Moderate HK RVSP 13m HG   6 min walk 01/10/13, 1290 feet  6MW (01/10/14) = 1280 feet (380 m) 6MW (9/15) = 1350 feet (411 m) O2 sats ranged from 89-95% on room air, HR ranged from 100-129.   Follow-up:  Started on treprostinil (06/30/14)  and gradually up titrating. He got up to 1.375 mg TID last week but unable to tolerate due to flushing, HAs and diarrhea.  Now he cut back to 0.1259mtid. Feeling better. Breathing is getting better.  Wears O2 as need. Not swelling. Trying to cut down on ETOH.  Denies presyncope/syncope. Taking lasix M/W/F. Weight stable. Going to pulmonary rehab tues and Thursday.   10/19/13 Creatinine 1.79 K 4.1  3/15         Cr 1.78 K 4.2 03/17/14    CR 1.23 K 4.0 LFTs ok 06/06/14 K 4.0 Creatinine 1.4  08/08/14 K 4.0 Cr 1.6 09/12/14 K 4.3 Cr 1.8 LFTs ok  Current Outpatient Prescriptions on File Prior to Encounter  Medication Sig Dispense Refill   . ADCIRCA 20 MG TABS TAKE 2 TABLETS (40 MG TOTAL) DAILY 180 tablet 2  . allopurinol (ZYLOPRIM) 100 MG tablet TAKE 1 TABLET DAILY 90 tablet 1  . diphenoxylate-atropine (LOMOTIL) 2.5-0.025 MG per tablet Take 1 tablet by mouth 2 (two) times daily. 60 tablet 3  . Doxepin HCl 6 MG TABS Take 6 mg by mouth daily as needed (anxiety).    . furosemide (LASIX) 40 MG tablet Take 1 tablet (40 mg total) by mouth as directed. Every Mon, Wed, Fri 30 tablet 6  . levothyroxine (SYNTHROID, LEVOTHROID) 25 MCG tablet TAKE 1 TABLET DAILY 90 tablet 1  . loperamide (ANTI-DIARRHEAL) 2 MG capsule Take 2 mg by mouth every 4 (four) hours as needed for diarrhea or loose stools.     . Marland Kitchenosartan (COZAAR) 50 MG tablet Take 1 tablet (50 mg total) by mouth daily. 90 tablet 3  . Macitentan (OPSUMIT) 10 MG TABS Take 10 mg by mouth daily. 90 tablet 3  . mometasone-formoterol (DULERA) 200-5 MCG/ACT AERO Inhale 2 puffs into the lungs 2 (two) times daily. 13 g 6  . omeprazole (PRILOSEC) 40 MG capsule TAKE 1 CAPSULE DAILY 90 capsule  2  . potassium chloride (K-DUR) 10 MEQ tablet Take 2 tablets (20 mEq total) by mouth daily. Take extra 2 tabs on Mon and Fri 90 tablet 3  . spironolactone (ALDACTONE) 50 MG tablet TAKE 1 TABLET DAILY 90 tablet 0  . SYMAX-SR 0.375 MG 12 hr tablet TAKE 1 TABLET TWICE A DAY 180 tablet 0  . Tadalafil, PAH, (ADCIRCA) 20 MG TABS Take 2 tablets (40 mg total) by mouth daily. 28 tablet 0  . Treprostinil Diolamine ER 0.125 MG TBCR Take 0.375 mg by mouth 3 (three) times daily.    Marland Kitchen triamcinolone cream (KENALOG) 0.1 % Apply 1 application topically 2 (two) times daily as needed. Dermatitis, 90 day supply, 3 tubes 85.2 g 1  . VERELAN 180 MG 24 hr capsule TAKE 1 CAPSULE AT BEDTIME 90 capsule 1   No current facility-administered medications on file prior to encounter.     Objective:   Weight Range:  Vital Signs:   Pulse Rate:  [98] 98 (12/10 1030) BP: (142)/(80) 142/80 mmHg (12/10 1030) SpO2:  [95 %] 95 % (12/10  1030) Weight:  [212 lb 12.8 oz (96.525 kg)] 212 lb 12.8 oz (96.525 kg) (12/10 1030)    Weight change: Filed Weights   10/12/14 1030  Weight: 212 lb 12.8 oz (96.525 kg)    Intake/Output:  No intake or output data in the 24 hours ending 10/12/14 1121   Physical Exam: General: NAD  Neck: JVP ~6 cm with HJR, no thyromegaly or thyroid nodule.  Lungs: Clear to auscultation bilaterally with normal respiratory effort.  CV: Nondisplaced PMI. Heart regular  2/6 TR P2 LLSB with right-sided S3. No carotid bruit. Normal pedal pulses.  Abdomen: Soft, nontender, no hepatosplenomegaly, distention.  Ext: warm No edema. No clubbing Skin: Intact without lesions or rashes.  Neurologic: Alert and oriented x 3.  Psych: Normal affect.  Extremities: No clubbing or cyanosis.  HEENT: Normal.   Labs: Basic Metabolic Panel: No results for input(s): NA, K, CL, CO2, GLUCOSE, BUN, CREATININE, CALCIUM, MG, PHOS in the last 168 hours.  Liver Function Tests: No results for input(s): AST, ALT, ALKPHOS, BILITOT, PROT, ALBUMIN in the last 168 hours. No results for input(s): LIPASE, AMYLASE in the last 168 hours. No results for input(s): AMMONIA in the last 168 hours.  CBC: No results for input(s): WBC, NEUTROABS, HGB, HCT, MCV, PLT in the last 168 hours.  Cardiac Enzymes: No results for input(s): CKTOTAL, CKMB, CKMBINDEX, TROPONINI in the last 168 hours.  BNP: BNP (last 3 results)  Recent Labs  10/18/13 0205 08/08/14 1115  PROBNP 619.8* 1109.0*     Other results:  :   Imaging: No results found.    Assessment/Plan    1. PAH - Suspected portopulmonary HTN.  Stable NYHA III on combination therapy - macitentan 10 daily, adcirca 40 mg daily, and now oral treprostinil.  6 minute walk last month is improved.   He had to cut back treprostinil due severe side effects. Will titrate back up to .25tid. Will call Butch Penny from [facility] to re-establish with him and help guide titration more closely.  -  Volume status better with lasix. Continue Mon-Wed-Fri and an extra 20 meq Kdur  Mon-Wed-Fri. Recheck BMET today as recent creatinine up slightly  - Echo in 3 months to assess response to treprostinil  - I have encouraged him to get a pulse oximeter to follow O2 sats and use O2 as need to keep them >= 90% at all times 2. CKD - Most  recent creatinine 1.8. Repeat BMET 3. ETOH cirrhosis - counseled on need to stop ETOH completely as this is likely contributing to [facility]. 4. HTN - BP stable. Continue losartan.  Glori Bickers, MD 11:21 AM

## 2014-10-12 NOTE — Addendum Note (Signed)
Encounter addended by: Scarlette Calico, RN on: 10/12/2014 11:38 AM<BR>     Documentation filed: [employer], Patient Instructions Section, Orders

## 2014-10-12 NOTE — Patient Instructions (Addendum)
Decrease Treprostinil to .025 Three times a day (the green tablet)  Lab today  Your physician recommends that you schedule a follow-up appointment in: 6 weeks

## 2014-10-17 ENCOUNTER — Encounter (HOSPITAL_COMMUNITY)
Admission: RE | Admit: 2014-10-17 | Discharge: 2014-10-17 | Disposition: A | Discharge status: Home / Self Care | Source: Ambulatory Visit | Attending: Cardiology | Admitting: Cardiology

## 2014-10-17 DIAGNOSIS — Z5189 Encounter for other specified aftercare: Secondary | ICD-10-CM | POA: Diagnosis not present

## 2014-10-17 NOTE — Progress Notes (Signed)
I have reviewed a Home Exercise Prescription with Darren Mueller . Darren Mueller is not currently exercising at home.  The patient was advised to walk 2-3 days a week for 25-30 minutes.  Darren Mueller and I discussed how to progress their exercise prescription.  The patient stated that he had a goal to increase his endurance in his daily activities.  The patient stated that they understand the exercise prescription.  We reviewed exercise guidelines, target heart rate during exercise, oxygen use, weather, home pulse oximeter, endpoints for exercise, and goals.  Patient is encouraged to come to me with any questions. I will continue to follow up with the patient to assist them with progression and safety.

## 2014-10-17 NOTE — Progress Notes (Signed)
Today, Darren Mueller exercised at [employer]. Service time was from 1030 to 1215.  The patient exercised for more than 31 minutes performing aerobic, strengthening, and stretching exercises. Oxygen saturation, heart rate, blood pressure, rate of perceived exertion, and shortness of breath were all monitored before, during, and after exercise. Keita presented with no problems at today's exercise session.   There was no workload change during today's exercise session.  Pre-exercise vitals: . Weight kg: 95.5 . Liters of O2: RA . SpO2: 97 . HR: 82 . BP: 116/64 . CBG: na  Exercise vitals: . Highest heartrate:  122 . Lowest oxygen saturation: 92 . Highest blood pressure: 132/60 . Liters of 02: RA  Post-exercise vitals: . SpO2: 96 . HR: 91 . BP: 128/60 . Liters of O2: RA . CBG: na Dr. Brand Males, Medical Director Dr. Coralyn Pear is immediately available during today's Pulmonary Rehab session for [facility] on 10/17/2014 at 1030 class time.  Marland Kitchen

## 2014-10-19 ENCOUNTER — Encounter (HOSPITAL_COMMUNITY)
Admission: RE | Admit: 2014-10-19 | Discharge: 2014-10-19 | Disposition: A | Discharge status: Home / Self Care | Source: Ambulatory Visit | Attending: Cardiology | Admitting: Cardiology

## 2014-10-19 NOTE — Progress Notes (Signed)
Today, Darren Mueller exercised at [employer]. Service time was from 10:30 to 12:10.  The patient exercised for more than 31 minutes performing aerobic, strengthening, and stretching exercises. Oxygen saturation, heart rate, blood pressure, rate of perceived exertion, and shortness of breath were all monitored before, during, and after exercise. [name] presented with no problems at today's exercise session. Patient attended education class on Pursed Lip and Diaphragmatic breathing with Amyri Frenz.  There was an increase in workload change during today's exercise session.  Pre-exercise vitals: . Weight kg: 97.7 . Liters of O2: ra . SpO2: 96 . HR: 90 . BP: 120/64 . CBG: na  Exercise vitals: . Highest heartrate:  110 . Lowest oxygen saturation: 95 . Highest blood pressure: 134/76 . Liters of 02: ra  Post-exercise vitals: . SpO2: 97 . HR: 90 . BP: 124/60 . Liters of O2: ra . CBG: na  Dr. Brand Males, Medical Director Dr. Tana Coast is immediately available during today's Pulmonary Rehab session for [facility] on 10/19/14 at 10:30am class time.

## 2014-10-20 ENCOUNTER — Other Ambulatory Visit: Payer: Self-pay | Admitting: Family Medicine

## 2014-10-24 ENCOUNTER — Encounter (HOSPITAL_COMMUNITY)
Admission: RE | Admit: 2014-10-24 | Discharge: 2014-10-24 | Disposition: A | Discharge status: Home / Self Care | Source: Ambulatory Visit | Attending: Cardiology | Admitting: Cardiology

## 2014-10-24 DIAGNOSIS — Z5189 Encounter for other specified aftercare: Secondary | ICD-10-CM | POA: Diagnosis not present

## 2014-10-24 NOTE — Progress Notes (Signed)
Today, Keegen exercised at [employer]. Service time was from 1030 to 1200.  The patient exercised for more than 31 minutes performing aerobic, strengthening, and stretching exercises. Oxygen saturation, heart rate, blood pressure, rate of perceived exertion, and shortness of breath were all monitored before, during, and after exercise. Tristen presented with no problems at today's exercise session.   There was no workload change during today's exercise session.  Pre-exercise vitals: . Weight kg: 95.8 . Liters of O2: RA . SpO2: 96 . HR: 88 . BP: 110/60 . CBG: na  Exercise vitals: . Highest heartrate:  121 . Lowest oxygen saturation: 92 . Highest blood pressure: 126/80 . Liters of 02: RA  Post-exercise vitals: . SpO2: 96 . HR: 93 . BP: 110/78 . Liters of O2: 96 . CBG: na Dr. Brand Males, Medical Director Dr. Tana Coast is immediately available during today's Pulmonary Rehab session for [facility] on 10/24/2014 at 1030 class time.  Marland Kitchen

## 2014-10-25 ENCOUNTER — Other Ambulatory Visit: Payer: Self-pay | Admitting: Family Medicine

## 2014-10-25 ENCOUNTER — Telehealth (HOSPITAL_COMMUNITY): Payer: Self-pay | Admitting: *Deleted

## 2014-10-25 NOTE — Telephone Encounter (Signed)
Last filled: 08/14/14 Amt: 90, 0 refill Last OV:  09/12/14  Med filled

## 2014-10-25 NOTE — Telephone Encounter (Signed)
Received call from pt confused on taking his treprostinil, he states he has been taking 0.25 mg Three times a day since last OV and has a lot of pills left but [facility] is calling him to send another shipment.  I called and spoke w/[facility] they state their records indicate pt was on a much higher dose and they were not aware that pt had cut back himself, they recommend pt continue his current dose for now and they will contact him next week to verify his dose, instruct on up titration and send pt a new shipment.  Pt is aware and agreeable

## 2014-10-26 ENCOUNTER — Encounter (HOSPITAL_COMMUNITY)
Admission: RE | Admit: 2014-10-26 | Discharge: 2014-10-26 | Disposition: A | Discharge status: Home / Self Care | Source: Ambulatory Visit | Attending: Cardiology | Admitting: Cardiology

## 2014-10-26 DIAGNOSIS — Z5189 Encounter for other specified aftercare: Secondary | ICD-10-CM | POA: Diagnosis not present

## 2014-10-26 NOTE — Progress Notes (Signed)
Today, [name] exercised at [employer]. Service time was from 1015 to 1200.  The patient exercised for more than 31 minutes performing aerobic, strengthening, and stretching exercises. Oxygen saturation, heart rate, blood pressure, rate of perceived exertion, and shortness of breath were all monitored before, during, and after exercise. Darren Mueller presented with no problems at today's exercise session.   There was no workload change during today's exercise session.  Pre-exercise vitals: . Weight kg: 96.4 . Liters of O2: ra . SpO2: 96 . HR: 84 . BP: 120/70 . CBG: na  Exercise vitals: . Highest heartrate:  110 . Lowest oxygen saturation: 92 . Highest blood pressure: 124/72 . Liters of 02: ra  Post-exercise vitals: . SpO2: 97 . HR: 83 . BP: 114/64 . Liters of O2: ra . CBG: na Dr. Brand Males, Medical Director Dr. Candiss Norse is immediately available during today's Pulmonary Rehab session for [facility] on 10/26/2014 at 1030 class time.  Marland Kitchen

## 2014-10-31 ENCOUNTER — Encounter (HOSPITAL_COMMUNITY)

## 2014-11-02 ENCOUNTER — Encounter (HOSPITAL_COMMUNITY)
Admission: RE | Admit: 2014-11-02 | Discharge: 2014-11-02 | Disposition: A | Discharge status: Home / Self Care | Source: Ambulatory Visit | Attending: Cardiology | Admitting: Cardiology

## 2014-11-02 DIAGNOSIS — Z5189 Encounter for other specified aftercare: Secondary | ICD-10-CM | POA: Diagnosis not present

## 2014-11-02 NOTE — Progress Notes (Signed)
Today, Darren Mueller exercised at [employer]. Service time was from 10:30am to 12:30pm.  The patient exercised for more than 31 minutes performing aerobic, strengthening, and stretching exercises. Oxygen saturation, heart rate, blood pressure, rate of perceived exertion, and shortness of breath were all monitored before, during, and after exercise. [name] presented with no problems at today's exercise session. Patient attended Warning Signs and Symptoms education with Rosebud Poles.  There was no workload change during today's exercise session.  Pre-exercise vitals: . Weight kg: 96.2 . Liters of O2: a . SpO2: 99 . HR: 88 . BP: 132/76 . CBG: na  Exercise vitals: . Highest heartrate:  89 . Lowest oxygen saturation: 96 . Highest blood pressure: 122/68 . Liters of 02: ra  Post-exercise vitals: . SpO2: 96 . HR: 80 . BP: 122/70 . Liters of O2: ra . CBG: na  Dr. Brand Males, Medical Director Dr. Candiss Norse is immediately available during today's Pulmonary Rehab session for [facility] on 11/02/14 at 10:30am class time.

## 2014-11-07 ENCOUNTER — Encounter (HOSPITAL_COMMUNITY)
Admission: RE | Admit: 2014-11-07 | Discharge: 2014-11-07 | Disposition: A | Discharge status: Home / Self Care | Source: Ambulatory Visit | Attending: Cardiology | Admitting: Cardiology

## 2014-11-07 DIAGNOSIS — I272 Other secondary pulmonary hypertension: Secondary | ICD-10-CM | POA: Insufficient documentation

## 2014-11-07 DIAGNOSIS — Z87891 Personal history of nicotine dependence: Secondary | ICD-10-CM | POA: Diagnosis not present

## 2014-11-07 DIAGNOSIS — Z5189 Encounter for other specified aftercare: Secondary | ICD-10-CM | POA: Insufficient documentation

## 2014-11-07 NOTE — Progress Notes (Signed)
Today, Rayyan exercised at [employer]. Service time was from 10:29mto 12:05pm.  The patient exercised for more than 31 minutes performing aerobic, strengthening, and stretching exercises. Oxygen saturation, heart rate, blood pressure, rate of perceived exertion, and shortness of breath were all monitored before, during, and after exercise. CAzampresented with no problems at today's exercise session.   There was an increase in workload change during today's exercise session.  Pre-exercise vitals: . Weight kg: 96.8 . Liters of O2: ra . SpO2: 92 . HR: 92 . BP: 116/68 . CBG: na  Exercise vitals: . Highest heartrate:  121 . Lowest oxygen saturation: 92 . Highest blood pressure: 142/82 . Liters of 02: ra  Post-exercise vitals: . SpO2: 99 . HR: 93 . BP: 120/70 . Liters of O2: ra . CBG: na  Dr. MBrand Males Medical Director Dr. ZCoralyn Pearis immediately available during today's [facility] at 10:30am class time.

## 2014-11-09 ENCOUNTER — Encounter (HOSPITAL_COMMUNITY)
Admission: RE | Admit: 2014-11-09 | Discharge: 2014-11-09 | Disposition: A | Discharge status: Home / Self Care | Source: Ambulatory Visit | Attending: Cardiology | Admitting: Cardiology

## 2014-11-09 ENCOUNTER — Encounter (HOSPITAL_COMMUNITY): Payer: Self-pay

## 2014-11-09 DIAGNOSIS — Z5189 Encounter for other specified aftercare: Secondary | ICD-10-CM | POA: Diagnosis not present

## 2014-11-09 NOTE — Progress Notes (Signed)
Today, Darren Mueller exercised at [employer]. Service time was from 10:30am to 12:20pm.  The patient exercised for more than 31 minutes performing aerobic, strengthening, and stretching exercises. Oxygen saturation, heart rate, blood pressure, rate of perceived exertion, and shortness of breath were all monitored before, during, and after exercise. [name] presented with no problems at today's exercise session. Patient attended education class with Yashua Bracco on "Risk Factor Reduction."  There was no workload change during today's exercise session.  Pre-exercise vitals: . Weight kg: 96.2 . Liters of O2: ra . SpO2: 95 . HR: 88 . BP: 122/70 . CBG: na  Exercise vitals: . Highest heartrate:  119 . Lowest oxygen saturation: 94 . Highest blood pressure: 132/70 . Liters of 02: ra  Post-exercise vitals: . SpO2: 97 . HR: 84 . BP: 118/62 . Liters of O2: ra . CBG: na  Dr. Brand Males, Medical Director Dr. Coralyn Pear is immediately available during today's Pulmonary Rehab session for [facility] on 11/09/14 at 10:30am class time.

## 2014-11-14 ENCOUNTER — Encounter (HOSPITAL_COMMUNITY)
Admission: RE | Admit: 2014-11-14 | Discharge: 2014-11-14 | Disposition: A | Discharge status: Home / Self Care | Source: Ambulatory Visit | Attending: Cardiology | Admitting: Cardiology

## 2014-11-14 DIAGNOSIS — Z5189 Encounter for other specified aftercare: Secondary | ICD-10-CM | POA: Diagnosis not present

## 2014-11-14 NOTE — Progress Notes (Signed)
Today, Darren Mueller exercised at [employer]. Service time was from 1030 to 1155.  The patient exercised for more than 31 minutes performing aerobic, strengthening, and stretching exercises. Oxygen saturation, heart rate, blood pressure, rate of perceived exertion, and shortness of breath were all monitored before, during, and after exercise. Darren Mueller presented with no problems at today's exercise session.   There was no workload change during today's exercise session.  Pre-exercise vitals: . Weight kg: 94.0 . Liters of O2: ra . SpO2: 95 . HR: 78 . BP: 110/60 . CBG: na  Exercise vitals: . Highest heartrate:  116 . Lowest oxygen saturation: 95 . Highest blood pressure: 136/70 . Liters of 02: ra  Post-exercise vitals: . SpO2: 96 . HR: 83 . BP: 116/68 . Liters of O2: ra . CBG: na Dr. Brand Males, Medical Director Dr. Coralyn Pear is immediately available during today's Pulmonary Rehab session for [facility] on 11/14/2014 at 1030 class time.

## 2014-11-16 ENCOUNTER — Encounter (HOSPITAL_COMMUNITY)
Admission: RE | Admit: 2014-11-16 | Discharge: 2014-11-16 | Disposition: A | Discharge status: Home / Self Care | Source: Ambulatory Visit | Attending: Cardiology | Admitting: Cardiology

## 2014-11-16 DIAGNOSIS — Z5189 Encounter for other specified aftercare: Secondary | ICD-10-CM | POA: Diagnosis not present

## 2014-11-16 NOTE — Progress Notes (Signed)
Today, Darren Mueller exercised at [employer]. Service time was from 11:00am to 1:00pm.  The patient exercised for more than 31 minutes performing aerobic, strengthening, and stretching exercises. Oxygen saturation, heart rate, blood pressure, rate of perceived exertion, and shortness of breath were all monitored before, during, and after exercise. Darren Mueller presented with no problems at today's exercise session. The patient attended education class today with Dr. Nelda Marseille.  There was an increase in workload change during today's exercise session.  Pre-exercise vitals: . Weight kg: 95.2 . Liters of O2: ra . SpO2: 98 . HR: 83 . BP: 142/86 . CBG: na  Exercise vitals: . Highest heartrate:  101 . Lowest oxygen saturation: 96 . Highest blood pressure: 138/72 . Liters of 02: ra  Post-exercise vitals: . SpO2: 97 . HR: 73 . BP: 124/64 . Liters of O2: ra . CBG: na  Dr. Brand Males, Medical Director Dr. [name] Liming is immediately available during today's Pulmonary Rehab session for [facility] on 11/16/14 at 10:30am class time.

## 2014-11-17 ENCOUNTER — Encounter: Payer: Self-pay | Admitting: Family Medicine

## 2014-11-17 ENCOUNTER — Ambulatory Visit (INDEPENDENT_AMBULATORY_CARE_PROVIDER_SITE_OTHER): Admitting: Family Medicine

## 2014-11-17 ENCOUNTER — Other Ambulatory Visit (HOSPITAL_COMMUNITY): Payer: Self-pay | Admitting: Cardiology

## 2014-11-17 VITALS — BP 129/87 | HR 78 | Temp 98.0°F | Ht 72.0 in | Wt 213.8 lb

## 2014-11-17 DIAGNOSIS — L709 Acne, unspecified: Secondary | ICD-10-CM

## 2014-11-17 DIAGNOSIS — I2729 Other secondary pulmonary hypertension: Secondary | ICD-10-CM

## 2014-11-17 DIAGNOSIS — J449 Chronic obstructive pulmonary disease, unspecified: Secondary | ICD-10-CM

## 2014-11-17 DIAGNOSIS — K219 Gastro-esophageal reflux disease without esophagitis: Secondary | ICD-10-CM

## 2014-11-17 DIAGNOSIS — R197 Diarrhea, unspecified: Secondary | ICD-10-CM

## 2014-11-17 DIAGNOSIS — N183 Chronic kidney disease, stage 3 unspecified: Secondary | ICD-10-CM

## 2014-11-17 DIAGNOSIS — I5022 Chronic systolic (congestive) heart failure: Secondary | ICD-10-CM

## 2014-11-17 DIAGNOSIS — J42 Unspecified chronic bronchitis: Secondary | ICD-10-CM

## 2014-11-17 DIAGNOSIS — I509 Heart failure, unspecified: Secondary | ICD-10-CM

## 2014-11-17 DIAGNOSIS — I10 Essential (primary) hypertension: Secondary | ICD-10-CM

## 2014-11-17 DIAGNOSIS — N189 Chronic kidney disease, unspecified: Secondary | ICD-10-CM

## 2014-11-17 DIAGNOSIS — I272 Other secondary pulmonary hypertension: Secondary | ICD-10-CM

## 2014-11-17 DIAGNOSIS — I50810 Right heart failure, unspecified: Secondary | ICD-10-CM

## 2014-11-17 MED ORDER — POTASSIUM CHLORIDE ER 10 MEQ PO TBCR: 20.0000 meq | EXTENDED_RELEASE_TABLET | Freq: Every day | ORAL | Status: DC

## 2014-11-17 MED ORDER — MOMETASONE FURO-FORMOTEROL FUM 200-5 MCG/ACT IN AERO: 2.0000 | INHALATION_SPRAY | Freq: Two times a day (BID) | RESPIRATORY_TRACT | Status: DC

## 2014-11-17 MED ORDER — FLUTICASONE-SALMETEROL 230-21 MCG/ACT IN AERO: 2.0000 | INHALATION_SPRAY | Freq: Two times a day (BID) | RESPIRATORY_TRACT | Status: DC

## 2014-11-17 MED ORDER — CLINDAMYCIN PHOS-BENZOYL PEROX 1-5 % EX GEL: Freq: Two times a day (BID) | CUTANEOUS | Status: DC

## 2014-11-17 NOTE — Progress Notes (Signed)
Pre visit review using our clinic review tool, if applicable. No additional management support is needed unless otherwise documented below in the visit note. 

## 2014-11-17 NOTE — Patient Instructions (Signed)
Consider adding Benefiber daily to twice daily to help with loose stool

## 2014-11-21 ENCOUNTER — Encounter (HOSPITAL_COMMUNITY)

## 2014-11-22 ENCOUNTER — Encounter (HOSPITAL_COMMUNITY): Payer: Self-pay

## 2014-11-22 ENCOUNTER — Ambulatory Visit (HOSPITAL_COMMUNITY)
Admission: RE | Admit: 2014-11-22 | Discharge: 2014-11-22 | Disposition: A | Discharge status: Home / Self Care | Source: Ambulatory Visit | Attending: Internal Medicine | Admitting: Internal Medicine

## 2014-11-22 VITALS — BP 148/84 | HR 114 | Resp 20 | Wt 211.5 lb

## 2014-11-22 DIAGNOSIS — J449 Chronic obstructive pulmonary disease, unspecified: Secondary | ICD-10-CM | POA: Insufficient documentation

## 2014-11-22 DIAGNOSIS — K7460 Unspecified cirrhosis of liver: Secondary | ICD-10-CM | POA: Insufficient documentation

## 2014-11-22 DIAGNOSIS — I2729 Other secondary pulmonary hypertension: Secondary | ICD-10-CM

## 2014-11-22 DIAGNOSIS — F1020 Alcohol dependence, uncomplicated: Secondary | ICD-10-CM

## 2014-11-22 DIAGNOSIS — I279 Pulmonary heart disease, unspecified: Secondary | ICD-10-CM

## 2014-11-22 DIAGNOSIS — N189 Chronic kidney disease, unspecified: Secondary | ICD-10-CM | POA: Insufficient documentation

## 2014-11-22 DIAGNOSIS — I509 Heart failure, unspecified: Secondary | ICD-10-CM

## 2014-11-22 DIAGNOSIS — Z79899 Other long term (current) drug therapy: Secondary | ICD-10-CM | POA: Diagnosis not present

## 2014-11-22 DIAGNOSIS — I129 Hypertensive chronic kidney disease with stage 1 through stage 4 chronic kidney disease, or unspecified chronic kidney disease: Secondary | ICD-10-CM | POA: Insufficient documentation

## 2014-11-22 DIAGNOSIS — I272 Other secondary pulmonary hypertension: Secondary | ICD-10-CM | POA: Insufficient documentation

## 2014-11-22 DIAGNOSIS — I50810 Right heart failure, unspecified: Secondary | ICD-10-CM

## 2014-11-22 DIAGNOSIS — K766 Portal hypertension: Secondary | ICD-10-CM

## 2014-11-22 DIAGNOSIS — I2721 Secondary pulmonary arterial hypertension: Secondary | ICD-10-CM

## 2014-11-22 MED ORDER — NONFORMULARY OR COMPOUNDED ITEM: Status: DC

## 2014-11-22 NOTE — Addendum Note (Signed)
Encounter addended by: Scarlette Calico, RN on: 11/22/2014  2:09 PM<BR>     Documentation filed: Patient Instructions Section, Medications, Orders

## 2014-11-22 NOTE — Progress Notes (Signed)
Patient ID: Darren Mueller, male   DOB: 01-08-1951, 64 y.o.   MRN: [id number]  GI : Dr Ardis Hughs PCP: DrBlyth  Subjective:    Darren Mueller is a 64 yo with history of COPD, portopulmonary hypertension with RV failure, and ETOH cirrhosis presents for followup of his PAH.  [name] 2/14: RA = 13  RV = 63/5/14  PA = 65/24 (41)  PCW = 6  Hepatic wedge = 21  Fick cardiac output/index = 3.8/1.8  PVR = 9.1  FA sat = 94%  PA sat = 58%, 56%  SVC = 54%  PFTs 3/14 FEV1 2.02 L (58%) FVC 2.7L (54%) FEV1/FVC (89%) DLCO 53%  RHC 06/06/14 RA = 5  RV = 73/5/9  PA = 81/25 (46)  PCW = 7  Fick cardiac output/index = 6.0/2.8  PVR = 6.5 Woods  FA sat = 95%  PA sat = 71%, 67%  Unable to get hepatic wedge  Admitted in December 2014 with syncope in the setting of persistent diarrhea. C.diff and O&P negative.  Creatinine was up to 2.7 from 1.7 baseline. He was hydrated. Losartan and lasix stopped.. Event Monitor - NSR 10/26/13.   ECHO 12/14 EF 55-60% Peak PA pressure 35. Severe RV dysfunction  ECHO 7/15 EF 60% RV moderately to severely dilated. Moderate HK RVSP 49m HG   6 min walk 01/10/13, 1290 feet  6MW (01/10/14) = 1280 feet (380 m) 6MW (9/15) = 1350 feet (411 m) O2 sats ranged from 89-95% on room air, HR ranged from 100-129.   Follow-up:  Started on treprostinil (06/30/14)  and gradually up titrating. He got up to 1.375 mg TID last month but unable to tolerate due to flushing, HAs and diarrhea.  Now he cut back and is on 0.625 mg tid. Not tolerating well. With rash and flushing. Going to Pulmonary rehab. Breathing is getting better.  Wears O2 as need. Not swelling. Trying to cut down on ETOH.  Denies presyncope/syncope. Taking lasix M/W/F. Weight stable.   10/19/13 Creatinine 1.79 K 4.1  3/15         Cr 1.78 K 4.2 03/17/14    CR 1.23 K 4.0 LFTs ok 06/06/14 K 4.0 Creatinine 1.4  08/08/14 K 4.0 Cr 1.6 09/12/14 K 4.3 Cr 1.8 LFTs ok 12/15 K 4.3 Cr 1.15   Current Outpatient Prescriptions on File Prior to  Encounter  Medication Sig Dispense Refill  . ADCIRCA 20 MG TABS TAKE 2 TABLETS (40 MG TOTAL) DAILY 180 tablet 2  . clindamycin-benzoyl peroxide (BENZACLIN) gel Apply topically 2 (two) times daily. 50 g 1  . diphenoxylate-atropine (LOMOTIL) 2.5-0.025 MG per tablet Take 1 tablet by mouth 2 (two) times daily. 60 tablet 3  . Doxepin HCl 6 MG TABS Take 6 mg by mouth daily as needed (anxiety).    . fluticasone-salmeterol (ADVAIR HFA) 230-21 MCG/ACT inhaler Inhale 2 puffs into the lungs 2 (two) times daily. 1 Inhaler 2  . furosemide (LASIX) 40 MG tablet Take 1 tablet (40 mg total) by mouth as directed. Every Mon, Wed, Fri 30 tablet 6  . levothyroxine (SYNTHROID, LEVOTHROID) 25 MCG tablet TAKE 1 TABLET DAILY 90 tablet 1  . loperamide (ANTI-DIARRHEAL) 2 MG capsule Take 2 mg by mouth every 4 (four) hours as needed for diarrhea or loose stools.     .Marland Kitchenlosartan (COZAAR) 50 MG tablet Take 1 tablet (50 mg total) by mouth daily. 90 tablet 3  . Macitentan (OPSUMIT) 10 MG TABS Take 10 mg by mouth daily. 90 tablet  3  . Melatonin 3 MG CAPS Take by mouth.    . mometasone-formoterol (DULERA) 200-5 MCG/ACT AERO Inhale 2 puffs into the lungs 2 (two) times daily. 13 g 2  . omeprazole (PRILOSEC) 40 MG capsule TAKE 1 CAPSULE DAILY 90 capsule 2  . potassium chloride (K-DUR) 10 MEQ tablet Take 2 tablets (20 mEq total) by mouth daily. Take extra 2 tabs on Mon and Fri 360 tablet 3  . spironolactone (ALDACTONE) 50 MG tablet TAKE 1 TABLET DAILY 90 tablet 0  . SYMAX-SR 0.375 MG 12 hr tablet TAKE 1 TABLET TWICE A DAY 180 tablet 0  . Tadalafil, PAH, (ADCIRCA) 20 MG TABS Take 2 tablets (40 mg total) by mouth daily. 28 tablet 0  . Treprostinil Diolamine ER 0.125 MG TBCR Take 0.25 mg by mouth 3 (three) times daily. (Patient taking differently: Take 0.625 mg by mouth 3 (three) times daily. ) 120 tablet   . triamcinolone cream (KENALOG) 0.1 % Apply 1 application topically 2 (two) times daily as needed. Dermatitis, 90 day supply, 3 tubes  85.2 g 1  . VERELAN 180 MG 24 hr capsule TAKE 1 CAPSULE AT BEDTIME 90 capsule 1   No current facility-administered medications on file prior to encounter.     Objective:   Weight Range:  Vital Signs:   Pulse Rate:  [114] 114 (01/20 1346) Resp:  [20] 20 (01/20 1346) BP: (148)/(84) 148/84 mmHg (01/20 1346) SpO2:  [99 %] 99 % (01/20 1346) Weight:  [211 lb 8 oz (95.936 kg)] 211 lb 8 oz (95.936 kg) (01/20 1346)    Weight change: Filed Weights   11/22/14 1346  Weight: 211 lb 8 oz (95.936 kg)    Intake/Output:  No intake or output data in the 24 hours ending 11/22/14 1355   Physical Exam: General: NAD  Neck: JVP ~8 cm with HJR, no thyromegaly or thyroid nodule.  Lungs: Clear to auscultation bilaterally with normal respiratory effort.  CV: Nondisplaced PMI. Heart regular  2/6 TR P2 LLSB with right-sided S3. No carotid bruit. Normal pedal pulses.  Abdomen: Soft, nontender, no hepatosplenomegaly, distention.  Ext: warm  1+ edema. No clubbing Skin: Intact without lesions or rashes.  Neurologic: Alert and oriented x 3.  Psych: Normal affect.  Extremities: No clubbing or cyanosis.  HEENT: Normal. Except for erythematous rash/flushing  Labs: Basic Metabolic Panel: No results for input(s): NA, K, CL, CO2, GLUCOSE, BUN, CREATININE, CALCIUM, MG, PHOS in the last 168 hours.  Liver Function Tests: No results for input(s): AST, ALT, ALKPHOS, BILITOT, PROT, ALBUMIN in the last 168 hours. No results for input(s): LIPASE, AMYLASE in the last 168 hours. No results for input(s): AMMONIA in the last 168 hours.  CBC: No results for input(s): WBC, NEUTROABS, HGB, HCT, MCV, PLT in the last 168 hours.  Cardiac Enzymes: No results for input(s): CKTOTAL, CKMB, CKMBINDEX, TROPONINI in the last 168 hours.  BNP: BNP (last 3 results)  Recent Labs  08/08/14 1115  PROBNP 1109.0*     Other results:  :   Imaging: No results found.    Assessment/Plan    1. PAH - Suspected  portopulmonary HTN.  Stable NYHA III on combination therapy - macitentan 10 daily, adcirca 40 mg daily, and now oral treprostinil.  6 minute walk in 9/15 was improved.   Not tolerating treprostinil due severe side effects. Will switch to selexipeg and see if better for him. Will need repeat echo and 6MW in 2-3 months,.  - Volume status better with lasix.  Continue Mon-Wed-Fri and an extra 20 meq Kdur  Mon-Wed-Fri.  - I have encouraged him to get a pulse oximeter to follow O2 sats and use O2 as need to keep them >= 90% at all times 2. CKD - Most recent creatinine 1.15.  3. ETOH cirrhosis - counseled on need to stop ETOH completely as this is likely contributing to [facility]. 4. HTN - BP stable. Continue losartan.  Glori Bickers, MD  1:55 PM

## 2014-11-22 NOTE — Patient Instructions (Signed)
Stop Treprostinil   Start Selexipag Twice daily   Your physician recommends that you schedule a follow-up appointment in: 4 weeks

## 2014-11-23 ENCOUNTER — Encounter (HOSPITAL_COMMUNITY)
Admission: RE | Admit: 2014-11-23 | Discharge: 2014-11-23 | Disposition: A | Discharge status: Home / Self Care | Source: Ambulatory Visit | Attending: Cardiology | Admitting: Cardiology

## 2014-11-23 DIAGNOSIS — Z5189 Encounter for other specified aftercare: Secondary | ICD-10-CM | POA: Diagnosis not present

## 2014-11-23 NOTE — Progress Notes (Signed)
Today, Armanii exercised at [employer] [facility]. Service time was from 10:30am to 12:30pm.  The patient exercised for more than 31 minutes performing aerobic, strengthening, and stretching exercises. Oxygen saturation, heart rate, blood pressure, rate of perceived exertion, and shortness of breath were all monitored before, during, and after exercise. Adelard presented with no problems at today's exercise session. The patient attended education today with Trish Fountain on "Oxygen Use and Safety."  There was an increase in workload change during today's exercise session.  Pre-exercise vitals: . Weight kg: 95.9 . Liters of O2: ra . SpO2: 98 . HR: 94 . BP: 108/60 . CBG: na  Exercise vitals: . Highest heartrate:  126 . Lowest oxygen saturation: 95 . Highest blood pressure: 128/70 . Liters of 02: ra  Post-exercise vitals: . SpO2: 97 . HR: 86 . BP: 106/62 . Liters of O2: ra . CBG: na  Dr. Brand Males, Medical Director Dr. Daleen Bo is immediately available during today's Pulmonary Rehab session for [facility] on 11/23/14 at 10:30am class time.

## 2014-11-26 ENCOUNTER — Encounter: Payer: Self-pay | Admitting: Family Medicine

## 2014-11-26 NOTE — Assessment & Plan Note (Signed)
Improved on recent check

## 2014-11-26 NOTE — Assessment & Plan Note (Signed)
Well controlled, no changes to meds. Encouraged heart healthy diet such as the DASH diet and exercise as tolerated.

## 2014-11-26 NOTE — Assessment & Plan Note (Addendum)
Requested refills on Dulera which has worked well for him but he has always had samples in the past. Insurance declines to cover. We will try a switch to Advair and he will report any concerning symptoms

## 2014-11-26 NOTE — Assessment & Plan Note (Signed)
Continues to struggle with SOB but has just undergone cardiac rehab and that has been helpful.

## 2014-11-26 NOTE — Assessment & Plan Note (Signed)
Improved but persistent. Encouraged Benefiber bid and a probiotic.

## 2014-11-26 NOTE — Assessment & Plan Note (Signed)
Avoid offending foods, start probiotics. Do not eat large meals in late evening and consider raising head of bed.  

## 2014-11-26 NOTE — Progress Notes (Signed)
Patient ID: Darren Mueller, male   DOB: 1951/03/28, 64 y.o.   MRN: [id number]   Darren Mueller  [id number] 1951/02/08 11/26/2014      Progress Note-Follow Up  Subjective  Chief Complaint  Chief Complaint  Patient presents with  . Follow-up    2 mos    HPI  Patient is a 64 y.o. male in today for routine medical care. Here today with his son. Doing fairly well. Has just undergone cardio pulmonary rehab and that has helped his fatigue and sob. No recent illness. Denies CP/palp/SOB/HA/congestion/fevers/GI or GU c/o. Taking meds as prescribed  Past Medical History  Diagnosis Date  . Hypertension   . Seizures   . Hx of colonic polyps   . GERD (gastroesophageal reflux disease)   . Gout   . Alcoholic cirrhosis of liver with ascites 2014  . Unspecified hypothyroidism 01/25/2013  . Unspecified pleural effusion   . Other chronic pulmonary heart diseases   . Hypopotassemia   . Renal insufficiency 07/18/2013  . Diarrhea 09/04/2013  . Otalgia 11/28/2013    Past Surgical History  Procedure Laterality Date  . Colonoscopy N/A 12/08/2013    Procedure: COLONOSCOPY;  Surgeon: Milus Banister, MD;  Location: [facility];  Service: Endoscopy;  Laterality: N/A;  . Right heart catheterization Right 06/06/2014    Procedure: RIGHT HEART CATH;  Surgeon: Jolaine Artist, MD;  Location: [facility] CATH LAB;  Service: Cardiovascular;  Laterality: Right;    Family History  Problem Relation Age of Onset  . Heart disease Mother   . Prostate cancer Father   . Colon cancer Neg Hx   . Heart attack Mother   . Hypertension Mother     History   Social History  . Marital Status: Divorced    Spouse Name: N/A    Number of Children: 3  . Years of Education: N/A   Occupational History  . Retired     Social research officer, government   Social History Main Topics  . Smoking status: Former Smoker -- 2.00 packs/day for 5 years    Types: Cigarettes    Quit date: 09/22/1979  . Smokeless tobacco: Never Used  . Alcohol Use: Yes   Comment: 3-4 cans of beers and 4-5 drinks of vodka - has not drunk any alcohol in the last few weeks  . Drug Use: No  . Sexual Activity: Not on file   Other Topics Concern  . Not on file   Social History Narrative   0 caffeine drinks daily     Current Outpatient Prescriptions on File Prior to Visit  Medication Sig Dispense Refill  . ADCIRCA 20 MG TABS TAKE 2 TABLETS (40 MG TOTAL) DAILY 180 tablet 2  . diphenoxylate-atropine (LOMOTIL) 2.5-0.025 MG per tablet Take 1 tablet by mouth 2 (two) times daily. 60 tablet 3  . furosemide (LASIX) 40 MG tablet Take 1 tablet (40 mg total) by mouth as directed. Every Mon, Wed, Fri 30 tablet 6  . levothyroxine (SYNTHROID, LEVOTHROID) 25 MCG tablet TAKE 1 TABLET DAILY 90 tablet 1  . loperamide (ANTI-DIARRHEAL) 2 MG capsule Take 2 mg by mouth every 4 (four) hours as needed for diarrhea or loose stools.     Marland Kitchen losartan (COZAAR) 50 MG tablet Take 1 tablet (50 mg total) by mouth daily. 90 tablet 3  . Macitentan (OPSUMIT) 10 MG TABS Take 10 mg by mouth daily. 90 tablet 3  . Melatonin 3 MG CAPS Take by mouth.    Marland Kitchen omeprazole (PRILOSEC) 40 MG capsule  TAKE 1 CAPSULE DAILY 90 capsule 2  . spironolactone (ALDACTONE) 50 MG tablet TAKE 1 TABLET DAILY 90 tablet 0  . SYMAX-SR 0.375 MG 12 hr tablet TAKE 1 TABLET TWICE A DAY 180 tablet 0  . Tadalafil, PAH, (ADCIRCA) 20 MG TABS Take 2 tablets (40 mg total) by mouth daily. 28 tablet 0  . triamcinolone cream (KENALOG) 0.1 % Apply 1 application topically 2 (two) times daily as needed. Dermatitis, 90 day supply, 3 tubes 85.2 g 1  . VERELAN 180 MG 24 hr capsule TAKE 1 CAPSULE AT BEDTIME 90 capsule 1  . Doxepin HCl 6 MG TABS Take 6 mg by mouth daily as needed (anxiety).     No current facility-administered medications on file prior to visit.    Allergies  Allergen Reactions  . Aspirin Other (See Comments)    hypertension    Review of Systems  Review of Systems  Constitutional: Negative for fever and malaise/fatigue.    HENT: Negative for congestion.   Eyes: Negative for discharge.  Respiratory: Negative for shortness of breath.   Cardiovascular: Negative for chest pain, palpitations and leg swelling.  Gastrointestinal: Negative for nausea, abdominal pain and diarrhea.  Genitourinary: Negative for dysuria.  Musculoskeletal: Negative for falls.  Skin: Negative for rash.  Neurological: Negative for loss of consciousness and headaches.  Endo/Heme/Allergies: Negative for polydipsia.  Psychiatric/Behavioral: Negative for depression and suicidal ideas. The patient is not nervous/anxious and does not have insomnia.     Objective  BP 129/87 mmHg  Pulse 78  Temp(Src) 98 F (36.7 C) (Oral)  Ht 6' (1.829 m)  Wt 213 lb 12.8 oz (96.979 kg)  BMI 28.99 kg/m2  SpO2 98%  Physical Exam  Physical Exam  Constitutional: He is oriented to person, place, and time and well-developed, well-nourished, and in no distress. No distress.  HENT:  Head: Normocephalic and atraumatic.  Eyes: Conjunctivae are normal.  Neck: Neck supple. No thyromegaly present.  Cardiovascular: Normal rate and regular rhythm.   Murmur heard. Pulmonary/Chest: Effort normal and breath sounds normal. No respiratory distress.  Abdominal: He exhibits no distension and no mass. There is no tenderness.  Musculoskeletal: He exhibits no edema.  Neurological: He is alert and oriented to person, place, and time.  Skin: Skin is warm.  Psychiatric: Memory, affect and judgment normal.    Lab Results  Component Value Date   TSH 1.64 09/12/2014   Lab Results  Component Value Date   WBC 3.8* 09/12/2014   HGB 13.4 09/12/2014   HCT 39.8 09/12/2014   MCV 104.2* 09/12/2014   PLT 58.0 Repeated and verified X2.* 09/12/2014   Lab Results  Component Value Date   CREATININE 1.15 10/12/2014   BUN 21 10/12/2014   NA 140 10/12/2014   K 4.3 10/12/2014   CL 109 10/12/2014   CO2 16* 10/12/2014   Lab Results  Component Value Date   ALT 30 09/12/2014    AST 40* 09/12/2014   ALKPHOS 114 09/12/2014   BILITOT 1.0 09/12/2014   Lab Results  Component Value Date   CHOL 196 03/17/2014   Lab Results  Component Value Date   HDL 65 03/17/2014   Lab Results  Component Value Date   LDLCALC 105* 03/17/2014   Lab Results  Component Value Date   TRIG 128 03/17/2014   Lab Results  Component Value Date   CHOLHDL 3.0 03/17/2014     Assessment & Plan  Hypertension Well controlled, no changes to meds. Encouraged heart healthy diet such  as the DASH diet and exercise as tolerated.    Chronic renal insufficiency, stage III (moderate) Improved on recent check   Diarrhea Improved but persistent. Encouraged Benefiber bid and a probiotic.    Right heart failure due to pulmonary hypertension Continues to struggle with SOB but has just undergone cardiac rehab and that has been helpful.   GERD (gastroesophageal reflux disease) Avoid offending foods, start probiotics. Do not eat large meals in late evening and consider raising head of bed.    Obstructive chronic bronchitis without exacerbation Requested refills on Dulera which has worked well for him but he has always had samples in the past. Insurance declines to cover. We will try a switch to Advair and he will report any concerning symptoms

## 2014-11-27 ENCOUNTER — Telehealth: Payer: Self-pay | Admitting: Family Medicine

## 2014-11-27 NOTE — Telephone Encounter (Signed)
Caller name: Bless Relation to pt: self Call back number: [phone] Pharmacy: [facility]   Reason for call:   Patient states that the cream that was prescribed is burning his face and would like something else prescribed. Patient states that he has stopped taking this.  Patient also states that we need to call [facility] to let them know that there is no such substitute for dulera.

## 2014-11-28 ENCOUNTER — Encounter (HOSPITAL_COMMUNITY)
Admission: RE | Admit: 2014-11-28 | Discharge: 2014-11-28 | Disposition: A | Discharge status: Home / Self Care | Source: Ambulatory Visit | Attending: Cardiology | Admitting: Cardiology

## 2014-11-28 DIAGNOSIS — Z5189 Encounter for other specified aftercare: Secondary | ICD-10-CM | POA: Diagnosis not present

## 2014-11-28 NOTE — Progress Notes (Signed)
Today, Darren Mueller exercised at [employer]. Service time was from 10:30am to 12:00pm.  The patient exercised for more than 31 minutes performing aerobic, strengthening, and stretching exercises. Oxygen saturation, heart rate, blood pressure, rate of perceived exertion, and shortness of breath were all monitored before, during, and after exercise. Darren Mueller presented with no problems at today's exercise session.   There was no workload change during today's exercise session.  Pre-exercise vitals: . Weight kg: 95.9 . Liters of O2: ra . SpO2: 97 . HR: 77 . BP: 108/60 . CBG: na  Exercise vitals: . Highest heartrate:  114 . Lowest oxygen saturation: 95 . Highest blood pressure: 136/70 . Liters of 02: ra  Post-exercise vitals: . SpO2: 95 . HR: 78 . BP: 114/64 . Liters of O2: ra . CBG: na  Dr. Brand Males, Medical Director Dr. Tana Coast is immediately available during today's Pulmonary Rehab session for [name] on 11/28/14 at 10:30am class time.

## 2014-11-28 NOTE — Telephone Encounter (Signed)
Please advise.//AB/CMA

## 2014-11-29 MED ORDER — VERAPAMIL HCL ER 180 MG PO CP24: 180.0000 mg | ORAL_CAPSULE | Freq: Every day | ORAL | Status: DC

## 2014-11-29 MED ORDER — MOMETASONE FUROATE 0.1 % EX CREA: 1.0000 "application " | TOPICAL_CREAM | Freq: Every day | CUTANEOUS | Status: DC

## 2014-11-29 NOTE — Telephone Encounter (Signed)
Opened in error

## 2014-11-29 NOTE — Telephone Encounter (Signed)
No clobetasol try Mometasone topical lotion, sig: apply a small amount daily to affected area. Disp 60 ML. D/C Advair and talk to insurance about getting [facility] approved. Make sure he tried the Advair then we can say he failed it. It is a similar med so we have to say we tried. We can also refer back to pulmonary

## 2014-11-29 NOTE — Telephone Encounter (Signed)
Called and spoke with patient, advised him that Mometasone topical lotion will be sent to the pharmacy downstairs.   Pt stated, concerning Dulera, that [facility] just needs in writing from our office that no substitutions for [facility] is allowed and he will be able to get [facility]. Spoke with Dr. Charlett Blake and she gave verbal go ahead to submit paperwork. Paperwork submitted to [employer].  Patient also stated that he would like generic Verelan because of cost. Script sent to [facility]. JG//CMA

## 2014-11-30 ENCOUNTER — Encounter (HOSPITAL_COMMUNITY)
Admission: RE | Admit: 2014-11-30 | Discharge: 2014-11-30 | Disposition: A | Discharge status: Home / Self Care | Source: Ambulatory Visit | Attending: Cardiology | Admitting: Cardiology

## 2014-11-30 DIAGNOSIS — Z5189 Encounter for other specified aftercare: Secondary | ICD-10-CM | POA: Diagnosis not present

## 2014-11-30 NOTE — Progress Notes (Signed)
Today, Darren Mueller exercised at [employer]. Service time was from 10:30a to 12:30p.  The patient exercised for more than 31 minutes performing aerobic, strengthening, and stretching exercises. Oxygen saturation, heart rate, blood pressure, rate of perceived exertion, and shortness of breath were all monitored before, during, and after exercise. Darren Mueller presented with no problems at today's exercise session. The patient attended education today with Jeanella Craze on Advanced Directives.  There was an increase in workload change during today's exercise session.  Pre-exercise vitals: . Weight kg: 95.6 . Liters of O2: ra . SpO2: 97 . HR: 81 . BP: 120/72 . CBG: na  Exercise vitals: . Highest heartrate:  111 . Lowest oxygen saturation: 95 . Highest blood pressure: 136/70 . Liters of 02: ra  Post-exercise vitals: . SpO2: 96 . HR: 83 . BP: 110/70 . Liters of O2: ra . CBG:  na  Dr. Brand Males, Medical Director Dr. Tana Coast is immediately available during today's Pulmonary Rehab session for [facility] on 11/30/14 at 10:30am class time.

## 2014-12-05 ENCOUNTER — Encounter (HOSPITAL_COMMUNITY)
Admission: RE | Admit: 2014-12-05 | Discharge: 2014-12-05 | Disposition: A | Discharge status: Home / Self Care | Source: Ambulatory Visit | Attending: Cardiology | Admitting: Cardiology

## 2014-12-05 DIAGNOSIS — Z5189 Encounter for other specified aftercare: Secondary | ICD-10-CM | POA: Insufficient documentation

## 2014-12-05 DIAGNOSIS — Z87891 Personal history of nicotine dependence: Secondary | ICD-10-CM | POA: Diagnosis not present

## 2014-12-05 DIAGNOSIS — I272 Other secondary pulmonary hypertension: Secondary | ICD-10-CM | POA: Diagnosis not present

## 2014-12-05 NOTE — Progress Notes (Signed)
Today, Darren Mueller exercised at [employer]. Service time was from 10:30 to 12:00n.  The patient exercised for more than 31 minutes performing aerobic, strengthening, and stretching exercises. Oxygen saturation, heart rate, blood pressure, rate of perceived exertion, and shortness of breath were all monitored before, during, and after exercise. [name] presented with no problems at today's exercise session.   There was no workload change during today's exercise session.  Pre-exercise vitals: . Weight kg: 94.6 . Liters of O2: ra . SpO2: 97 . HR: 86 . BP: 104/60 . CBG: na  Exercise vitals: . Highest heartrate:  109 . Lowest oxygen saturation: 95 . Highest blood pressure: 130/70 . Liters of 02: ra  Post-exercise vitals: . SpO2: 96 . HR: 74 . BP: 102/68 . Liters of O2: ra . CBG: na  Dr. Brand Males, Medical Director Dr. Tana Coast is immediately available during today's Pulmonary Rehab session for [name] on 12/05/14 at 10:30am class time.

## 2014-12-06 ENCOUNTER — Telehealth (HOSPITAL_COMMUNITY): Payer: Self-pay | Admitting: Cardiology

## 2014-12-06 NOTE — Telephone Encounter (Signed)
[facility] will need down titration orders for orenitram as pt will be starting uptravi soon  Please call back with orders so patient can begin new meds.  [facility] does not have a protocol to follow as guidelines for [name] is so new

## 2014-12-07 ENCOUNTER — Encounter (HOSPITAL_COMMUNITY)

## 2014-12-07 NOTE — Telephone Encounter (Signed)
Dr Haroldine Laws is checking on best way to titrate, [facility] is aware we are working on this and will fax them an order

## 2014-12-10 ENCOUNTER — Other Ambulatory Visit: Payer: Self-pay | Admitting: Family Medicine

## 2014-12-12 ENCOUNTER — Encounter (HOSPITAL_COMMUNITY)
Admission: RE | Admit: 2014-12-12 | Discharge: 2014-12-12 | Disposition: A | Discharge status: Home / Self Care | Source: Ambulatory Visit | Attending: Cardiology | Admitting: Cardiology

## 2014-12-12 DIAGNOSIS — Z5189 Encounter for other specified aftercare: Secondary | ICD-10-CM | POA: Diagnosis not present

## 2014-12-12 NOTE — Telephone Encounter (Signed)
Per Dr Haroldine Laws pt should decrease Treprostinil to 1/2 dose (0.125 mg) tid now and start selexipag 200 mcg bid, in 2 weeks stop treprostinil and increase selexipag to 400 mcg bid, order written and faxed to [facility] at [phone]

## 2014-12-12 NOTE — Progress Notes (Signed)
Today, [name] exercised at [employer]. Service time was from 10:30a to 12:10p.  The patient exercised for more than 31 minutes performing aerobic, strengthening, and stretching exercises. Oxygen saturation, heart rate, blood pressure, rate of perceived exertion, and shortness of breath were all monitored before, during, and after exercise. [name] presented with no problems at today's exercise session.   There was no workload change during today's exercise session.  Pre-exercise vitals: . Weight kg: 95.4 . Liters of O2: ra . SpO2: 96 . HR: 83 . BP: 126/60 . CBG: na  Exercise vitals: . Highest heartrate:  114 . Lowest oxygen saturation: 94 . Highest blood pressure: 150/72 . Liters of 02: ra  Post-exercise vitals: . SpO2: 96 . HR: 80 . BP: 118/60 . Liters of O2: ra . CBG: na  Dr. Brand Males, Medical Director Dr. Wyline Copas is immediately available during today's Pulmonary Rehab session for Darren Mueller on 12/12/14 at 10:30am class time.

## 2014-12-14 ENCOUNTER — Encounter (HOSPITAL_COMMUNITY)
Admission: RE | Admit: 2014-12-14 | Discharge: 2014-12-14 | Disposition: A | Discharge status: Home / Self Care | Source: Ambulatory Visit | Attending: Cardiology | Admitting: Cardiology

## 2014-12-14 DIAGNOSIS — Z5189 Encounter for other specified aftercare: Secondary | ICD-10-CM | POA: Diagnosis not present

## 2014-12-14 NOTE — Progress Notes (Signed)
Today, Riely exercised at [employer]. Service time was from 1030 to 1230.  The patient exercised for more than 31 minutes performing aerobic, strengthening, and stretching exercises. Oxygen saturation, heart rate, blood pressure, rate of perceived exertion, and shortness of breath were all monitored before, during, and after exercise. Suhail presented with no problems at today's exercise session. Patient attended the [facility] inservice today.  There was no workload change during today's exercise session.  Pre-exercise vitals: . Weight kg: 95.5 . Liters of O2: ra . SpO2: 98 . HR: 79 . BP: 128/60 . CBG: na  Exercise vitals: . Highest heartrate:  98 . Lowest oxygen saturation: 95 . Highest blood pressure: 138/72 . Liters of 02: ra  Post-exercise vitals: . SpO2: 94 . HR: 75 . BP: 126/80 . Liters of O2: ra . CBG: na Dr. Brand Males, Medical Director Dr. Candiss Norse is immediately available during today's Pulmonary Rehab session for [facility] on 12/14/2014 at 1030 class time.  Marland Kitchen

## 2014-12-19 ENCOUNTER — Encounter (HOSPITAL_COMMUNITY): Admission: RE | Admit: 2014-12-19 | Source: Ambulatory Visit

## 2014-12-21 ENCOUNTER — Encounter (HOSPITAL_COMMUNITY)
Admission: RE | Admit: 2014-12-21 | Discharge: 2014-12-21 | Disposition: A | Discharge status: Home / Self Care | Source: Ambulatory Visit | Attending: Cardiology | Admitting: Cardiology

## 2014-12-21 DIAGNOSIS — Z5189 Encounter for other specified aftercare: Secondary | ICD-10-CM | POA: Diagnosis not present

## 2014-12-21 NOTE — Progress Notes (Signed)
Today, Darren Mueller exercised at [employer] [facility]. Service time was from 1030 to 1215.  The patient exercised for more than 31 minutes performing aerobic, strengthening, and stretching exercises. Oxygen saturation, heart rate, blood pressure, rate of perceived exertion, and shortness of breath were all monitored before, during, and after exercise. Darren Mueller presented with no problems at today's exercise session. Darren Mueller also attended an education session on exercise for the pulmonary patient.  There was a workload change during today's exercise session.  Pre-exercise vitals: . Weight kg: 95.8 . Liters of O2: ra . SpO2: 99 . HR: 92 . BP: 146/70 . CBG: na  Exercise vitals: . Highest heartrate:  122 . Lowest oxygen saturation: 93 . Highest blood pressure: 144/70 . Liters of 02: ra  Post-exercise vitals: . SpO2: 96 . HR: 84 . BP: 114/60 . Liters of O2: ra . CBG: na  Dr. Brand Males, Medical Director Dr. Candiss Norse is immediately available during today's Pulmonary Rehab session for [facility] on 12/21/2014 at 1030 class time.

## 2014-12-22 NOTE — Telephone Encounter (Signed)
Received fax from [employer] stating Darren Mueller is a covered medication at this time. JG//CMA

## 2014-12-25 ENCOUNTER — Ambulatory Visit (HOSPITAL_COMMUNITY)
Admission: RE | Admit: 2014-12-25 | Discharge: 2014-12-25 | Disposition: A | Discharge status: Home / Self Care | Source: Ambulatory Visit | Attending: Internal Medicine | Admitting: Internal Medicine

## 2014-12-25 VITALS — BP 148/78 | HR 94 | Wt 214.8 lb

## 2014-12-25 DIAGNOSIS — I2729 Other secondary pulmonary hypertension: Secondary | ICD-10-CM

## 2014-12-25 DIAGNOSIS — N183 Chronic kidney disease, stage 3 unspecified: Secondary | ICD-10-CM

## 2014-12-25 DIAGNOSIS — I279 Pulmonary heart disease, unspecified: Secondary | ICD-10-CM

## 2014-12-25 DIAGNOSIS — J449 Chronic obstructive pulmonary disease, unspecified: Secondary | ICD-10-CM | POA: Insufficient documentation

## 2014-12-25 DIAGNOSIS — I272 Other secondary pulmonary hypertension: Secondary | ICD-10-CM | POA: Diagnosis not present

## 2014-12-25 DIAGNOSIS — L309 Dermatitis, unspecified: Secondary | ICD-10-CM

## 2014-12-25 DIAGNOSIS — K7030 Alcoholic cirrhosis of liver without ascites: Secondary | ICD-10-CM

## 2014-12-25 DIAGNOSIS — Z79899 Other long term (current) drug therapy: Secondary | ICD-10-CM | POA: Insufficient documentation

## 2014-12-25 DIAGNOSIS — I129 Hypertensive chronic kidney disease with stage 1 through stage 4 chronic kidney disease, or unspecified chronic kidney disease: Secondary | ICD-10-CM | POA: Insufficient documentation

## 2014-12-25 DIAGNOSIS — I2721 Secondary pulmonary arterial hypertension: Secondary | ICD-10-CM

## 2014-12-25 DIAGNOSIS — G4733 Obstructive sleep apnea (adult) (pediatric): Secondary | ICD-10-CM | POA: Insufficient documentation

## 2014-12-25 DIAGNOSIS — K766 Portal hypertension: Secondary | ICD-10-CM

## 2014-12-25 DIAGNOSIS — I5022 Chronic systolic (congestive) heart failure: Secondary | ICD-10-CM

## 2014-12-25 DIAGNOSIS — I509 Heart failure, unspecified: Secondary | ICD-10-CM

## 2014-12-25 DIAGNOSIS — N189 Chronic kidney disease, unspecified: Secondary | ICD-10-CM

## 2014-12-25 DIAGNOSIS — I50810 Right heart failure, unspecified: Secondary | ICD-10-CM

## 2014-12-25 LAB — BASIC METABOLIC PANEL
ANION GAP: 5 (ref 5–15)
BUN: 17 mg/dL (ref 6–23)
CALCIUM: 9 mg/dL (ref 8.4–10.5)
CHLORIDE: 109 mmol/L (ref 96–112)
CO2: 24 mmol/L (ref 19–32)
Creatinine, Ser: 1.24 mg/dL (ref 0.50–1.35)
GFR calc Af Amer: 69 mL/min — ABNORMAL LOW (ref >90)
GFR calc non Af Amer: 60 mL/min — ABNORMAL LOW (ref >90)
Glucose, Bld: 109 mg/dL — ABNORMAL HIGH (ref 70–99)
POTASSIUM: 4 mmol/L (ref 3.5–5.1)
SODIUM: 138 mmol/L (ref 135–145)

## 2014-12-25 LAB — BRAIN NATRIURETIC PEPTIDE: B NATRIURETIC PEPTIDE 5: 440.4 pg/mL — AB (ref 0.0–100.0)

## 2014-12-25 MED ORDER — POTASSIUM CHLORIDE ER 10 MEQ PO TBCR: 10.0000 meq | EXTENDED_RELEASE_TABLET | Freq: Every day | ORAL | Status: DC

## 2014-12-25 MED ORDER — TRIAMCINOLONE ACETONIDE 0.1 % EX CREA: 1.0000 "application " | TOPICAL_CREAM | Freq: Every day | CUTANEOUS | Status: DC | PRN

## 2014-12-25 MED ORDER — VERAPAMIL HCL ER 180 MG PO CP24: 180.0000 mg | ORAL_CAPSULE | Freq: Every morning | ORAL | Status: DC

## 2014-12-25 NOTE — Progress Notes (Signed)
Patient ID: Darren Mueller, male   DOB: 08-27-1951, 64 y.o.   MRN: [id number] GI : Dr Ardis Hughs PCP: Dr Charlett Blake  Subjective:    Darren Mueller is a 64 yo with history of COPD, portopulmonary hypertension with RV failure, and ETOH cirrhosis presents for followup of his PAH.  [place] 2/14: RA = 13  RV = 63/5/14  PA = 65/24 (41)  PCW = 6  Hepatic wedge = 21  Fick cardiac output/index = 3.8/1.8  PVR = 9.1  FA sat = 94%  PA sat = 58%, 56%  SVC = 54%  PFTs 3/14 FEV1 2.02 L (58%) FVC 2.7L (54%) FEV1/FVC (89%) DLCO 53%  RHC 06/06/14 RA = 5  RV = 73/5/9  PA = 81/25 (46)  PCW = 7  Fick cardiac output/index = 6.0/2.8  PVR = 6.5 [place]  FA sat = 95%  PA sat = 71%, 67%  Unable to get hepatic wedge  Admitted in December 2014 with syncope in the setting of persistent diarrhea. C.diff and O&P negative.  Creatinine was up to 2.7 from 1.7 baseline. He was hydrated. Losartan and lasix stopped.. Event Monitor - NSR 10/26/13.   ECHO 12/14 EF 55-60% Peak PA pressure 35. Severe RV dysfunction  ECHO 7/15 EF 60% RV moderately to severely dilated. Moderate HK RVSP 44m HG   6 min walk 01/10/13, 1290 feet  6MW (01/10/14) = 1280 feet (380 m) 6MW (9/15) = 1350 feet (411 m) O2 sats ranged from 89-95% on room air, HR ranged from 100-129.  Follow-up:  Started on treprostinil (06/30/14)  and gradually up titrating. He got up to 1.375 mg TID but unable to tolerate due to flushing, HAs and diarrhea.  Current plan is to cut back on orenitram and tirate up on selexipag to see if he tolerates this better.  He just started the selexipag last week and is going up on this and down on orenitram.  So far, he is doing well.  Flushing and diarrhea have resolved. Going to Pulmonary rehab. Breathing stable, gets some dyspnea with longer distances like [place] [employer]  Wears O2 as need. Not swelling. He has cut back on ETOH, drinks rarely.  Denies presyncope/syncope. Taking lasix M/W/F. Weight stable.  He does not sleep well.   Son says he gasps during the night.   Labs:  10/19/13 Creatinine 1.79 K 4.1  3/15         Cr 1.78 K 4.2 03/17/14    CR 1.23 K 4.0 LFTs ok 06/06/14 K 4.0 Creatinine 1.4  08/08/14 K 4.0 Cr 1.6 09/12/14 K 4.3 Cr 1.8 LFTs ok, HCT 58 12/15 K 4.3 Cr 1.15   Current Outpatient Prescriptions on File Prior to Encounter  Medication Sig Dispense Refill  . ADCIRCA 20 MG TABS TAKE 2 TABLETS (40 MG TOTAL) DAILY 180 tablet 2  . fluticasone-salmeterol (ADVAIR HFA) 230-21 MCG/ACT inhaler Inhale 2 puffs into the lungs 2 (two) times daily. 1 Inhaler 2  . furosemide (LASIX) 40 MG tablet Take 1 tablet (40 mg total) by mouth as directed. Every Mon, Wed, Fri 30 tablet 6  . levothyroxine (SYNTHROID, LEVOTHROID) 25 MCG tablet TAKE 1 TABLET DAILY 90 tablet 3  . loperamide (ANTI-DIARRHEAL) 2 MG capsule Take 4 mg by mouth daily.     .Marland Kitchenlosartan (COZAAR) 50 MG tablet Take 1 tablet (50 mg total) by mouth daily. 90 tablet 3  . Macitentan (OPSUMIT) 10 MG TABS Take 10 mg by mouth daily. 90 tablet 3  .  Melatonin 3 MG CAPS Take 3 mg by mouth at bedtime.     . mometasone (ELOCON) 0.1 % cream Apply 1 application topically daily. 50 g 1  . mometasone-formoterol (DULERA) 200-5 MCG/ACT AERO Inhale 2 puffs into the lungs 2 (two) times daily. 13 g 2  . omeprazole (PRILOSEC) 40 MG capsule TAKE 1 CAPSULE DAILY 90 capsule 2  . spironolactone (ALDACTONE) 50 MG tablet TAKE 1 TABLET DAILY 90 tablet 0  . SYMAX-SR 0.375 MG 12 hr tablet TAKE 1 TABLET TWICE A DAY (Patient taking differently: Take 1 tablet once daily) 180 tablet 0  . clindamycin-benzoyl peroxide (BENZACLIN) gel Apply topically 2 (two) times daily. 50 g 1  . diphenoxylate-atropine (LOMOTIL) 2.5-0.025 MG per tablet Take 1 tablet by mouth 2 (two) times daily. 60 tablet 3  . Doxepin HCl 6 MG TABS Take 6 mg by mouth daily as needed (anxiety).     No current facility-administered medications on file prior to encounter.    Objective:   Weight Range:  Vital Signs:   Pulse Rate:   [94] 94 (02/22 1346) BP: (148)/(78) 148/78 mmHg (02/22 1346) SpO2:  [96 %] 96 % (02/22 1346) Weight:  [214 lb 12 oz (97.41 kg)] 214 lb 12 oz (97.41 kg) (02/22 1346)    Weight change: Filed Weights   12/25/14 1346  Weight: 214 lb 12 oz (97.41 kg)   Physical Exam: General: NAD  Neck: JVP ~8 cm, no thyromegaly or thyroid nodule.  Lungs: Clear to auscultation bilaterally with normal respiratory effort.  CV: Nondisplaced PMI. Heart regular  2/6 TR P2 LLSB with right-sided S3. No carotid bruit. Normal pedal pulses.  Abdomen: Soft, nontender, no hepatosplenomegaly, distention.  Ext: warm, no edema. No clubbing Skin: Intact without lesions or rashes.  Neurologic: Alert and oriented x 3.  Psych: Normal affect.  Extremities: No clubbing or cyanosis.  HEENT: Normal. Except for erythematous rash/flushing  Labs: Basic Metabolic Panel:  Recent Labs Lab 12/25/14 1405  NA 138  K 4.0  CL 109  CO2 24  GLUCOSE 109*  BUN 17  CREATININE 1.24  CALCIUM 9.0    Liver Function Tests: No results for input(s): AST, ALT, ALKPHOS, BILITOT, PROT, ALBUMIN in the last 168 hours. No results for input(s): LIPASE, AMYLASE in the last 168 hours. No results for input(s): AMMONIA in the last 168 hours.  CBC: No results for input(s): WBC, NEUTROABS, HGB, HCT, MCV, PLT in the last 168 hours.  Cardiac Enzymes: No results for input(s): CKTOTAL, CKMB, CKMBINDEX, TROPONINI in the last 168 hours.  BNP: BNP (last 3 results)  Recent Labs  08/08/14 1115  PROBNP 1109.0*     Other results:  :   Imaging: No results found.    Assessment/Plan    1. PAH: Suspected portopulmonary HTN.  Stable NYHA III on combination therapy: macitentan 10 daily, adcirca 40 mg daily, and now oral selexipag.  He is titrating down on oral treprostinil (has had a hard time tolerating this) and up on selexipag.  He just started the selexipag last week but states that diarrhea and flushing are better.   - 6 minute walk at  next appointment in 6 weeks (will give him some time on selexipag before repeating 6MW).   - Echo in 3 months.  - Volume status better with lasix. Continue Mon-Wed-Fri and an extra 20 meq Kdur  Mon-Wed-Fri.  - I have encouraged him to get a pulse oximeter to follow O2 sats and use O2 as need to keep them >=  90% at all times 2. CKD: Most recent creatinine 1.15.  Check BMET/BNP today.  3. ETOH cirrhosis: counseled on need to stop ETOH completely as this is likely contributing to [facility]. 4. HTN: BP stable. Continue losartan. 5. OSA: Son says that patient gasps in his sleep and sometimes stops breathing.  I will arrange for sleep study.   Loralie Champagne, MD  12/25/2014

## 2014-12-25 NOTE — Patient Instructions (Signed)
Labs today.  You have been referred to  [place] for a sleep study  Your physician recommends that you schedule a follow-up appointment in: 6 weeks  Do the following things EVERYDAY: 1) Weigh yourself in the morning before breakfast. Write it down and keep it in a log. 2) Take your medicines as prescribed 3) Eat low salt foods-Limit salt (sodium) to 2000 mg per day.  4) Stay as active as you can everyday 5) Limit all fluids for the day to less than 2 liters 6)

## 2014-12-25 NOTE — Progress Notes (Signed)
Advanced Heart Failure Medication Review by a Pharmacist  Does the patient  feel that his/her medications are working for him/her?  yes  Has the patient been experiencing any side effects to the medications prescribed?  yes  Does the patient measure his/her own blood pressure or blood glucose at home?  no   Does the patient have any problems obtaining medications due to transportation or finances?   no  Understanding of regimen: good Understanding of indications: good Potential of compliance: good    Pharmacist comments:  [name] is a pleasant 64 yo M presenting with his medication bottles. He is currently titrating off of his Opsumit and transitioning to [place]. He was given specific directions for this transition and seems to have a good understanding of this. Since beginning the transition, his diarrhea and facial rash seem to be improving. He seems to have a good understanding of his medication regimen and we reviewed the indications for each of his medications. No other medication related issues were identified during this visit.   [name]. [name], PharmD Clinical Pharmacist - Resident Pager: [phone] Pharmacy: [phone] 12/25/2014 2:02 PM

## 2014-12-26 ENCOUNTER — Encounter (HOSPITAL_COMMUNITY)
Admission: RE | Admit: 2014-12-26 | Discharge: 2014-12-26 | Disposition: A | Discharge status: Home / Self Care | Source: Ambulatory Visit | Attending: Cardiology | Admitting: Cardiology

## 2014-12-26 DIAGNOSIS — Z5189 Encounter for other specified aftercare: Secondary | ICD-10-CM | POA: Diagnosis not present

## 2014-12-26 NOTE — Progress Notes (Signed)
Today, Breaker exercised at [employer]. Service time was from 10:30am to 12:05pm.  The patient exercised for more than 31 minutes performing aerobic, strengthening, and stretching exercises. Oxygen saturation, heart rate, blood pressure, rate of perceived exertion, and shortness of breath were all monitored before, during, and after exercise. Dahl presented with no problems at today's exercise session.   There was no workload change during today's exercise session.  Pre-exercise vitals: . Weight kg: 96.5 . Liters of O2: ra . SpO2: 98 . HR: 89 . BP: 136/82 . CBG: na  Exercise vitals: . Highest heartrate:  110 . Lowest oxygen saturation: 93 . Highest blood pressure: 144/80 . Liters of 02: ra  Post-exercise vitals: . SpO2: 95 . HR: 78 . BP: 126/60 . Liters of O2: ra . CBG: na  Dr. Brand Males, Medical Director Dr. Coralyn Pear is immediately available during today's Pulmonary Rehab session for [facility] on 12/26/14 at 10:30am class time.

## 2014-12-26 NOTE — Progress Notes (Signed)
Today, Butch exercised at [employer]. Service time was from 1030 to 1205.  The patient exercised for more than 31 minutes performing aerobic, strengthening, and stretching exercises. Oxygen saturation, heart rate, blood pressure, rate of perceived exertion, and shortness of breath were all monitored before, during, and after exercise. Darren Mueller presented with no problems at today's exercise session.   There was no workload change during today's exercise session.  Pre-exercise vitals: . Weight kg: 96.5 . Liters of O2: ra . SpO2: 98 . HR: 89 . BP: 136/82 . CBG: na  Exercise vitals: . Highest heartrate:  110 . Lowest oxygen saturation: 93 . Highest blood pressure: 144/80 . Liters of 02: ra  Post-exercise vitals: . SpO2: 95 . HR: 78 . BP: 126/60 . Liters of O2: ra . CBG: na Dr. Brand Males, Medical Director Dr. Coralyn Pear is immediately available during today's Pulmonary Rehab session for [facility] on 12/26/2014 at 1030 class time.  Marland Kitchen

## 2014-12-28 ENCOUNTER — Encounter (HOSPITAL_COMMUNITY)
Admission: RE | Admit: 2014-12-28 | Discharge: 2014-12-28 | Disposition: A | Discharge status: Home / Self Care | Source: Ambulatory Visit | Attending: Internal Medicine | Admitting: Internal Medicine

## 2014-12-28 DIAGNOSIS — Z5189 Encounter for other specified aftercare: Secondary | ICD-10-CM | POA: Diagnosis not present

## 2014-12-28 NOTE — Progress Notes (Signed)
Today, Darren Mueller exercised at [employer]. Service time was from 10:30am to 12:25pm.  The patient exercised for more than 31 minutes performing aerobic, strengthening, and stretching exercises. Oxygen saturation, heart rate, blood pressure, rate of perceived exertion, and shortness of breath were all monitored before, during, and after exercise. [name] presented with no problems at today's exercise session.  The patient attended education today with Rosebud Poles on "Signs and Symptoms."  There was no workload change during today's exercise session.  Pre-exercise vitals: . Weight kg: 96.8 . Liters of O2: ra . SpO2: 95 . HR: 87 . BP: 120/70 . CBG: na  Exercise vitals: . Highest heartrate:  117 . Lowest oxygen saturation: 93 . Highest blood pressure: 144/80 . Liters of 02: ra  Post-exercise vitals: . SpO2: 95 . HR: 82 . BP: 118/68 . Liters of O2: ra . CBG: na  Dr. Brand Males, Medical Director Dr. Candiss Norse is immediately available during today's Pulmonary Rehab session for [facility] on 12/28/14 at 10:30am class time.

## 2015-01-01 ENCOUNTER — Other Ambulatory Visit: Payer: Self-pay | Admitting: Family Medicine

## 2015-01-02 ENCOUNTER — Encounter (HOSPITAL_COMMUNITY)
Admission: RE | Admit: 2015-01-02 | Discharge: 2015-01-02 | Disposition: A | Discharge status: Home / Self Care | Source: Ambulatory Visit | Attending: Internal Medicine | Admitting: Internal Medicine

## 2015-01-02 DIAGNOSIS — Z5189 Encounter for other specified aftercare: Secondary | ICD-10-CM | POA: Diagnosis not present

## 2015-01-02 DIAGNOSIS — I272 Other secondary pulmonary hypertension: Secondary | ICD-10-CM | POA: Diagnosis not present

## 2015-01-02 DIAGNOSIS — Z87891 Personal history of nicotine dependence: Secondary | ICD-10-CM | POA: Diagnosis not present

## 2015-01-02 NOTE — Progress Notes (Signed)
Today, Darren Mueller exercised at [employer]. Service time was from 10:30am to 12:15pm.  The patient exercised for more than 31 minutes performing aerobic, strengthening, and stretching exercises. Oxygen saturation, heart rate, blood pressure, rate of perceived exertion, and shortness of breath were all monitored before, during, and after exercise. Darren Mueller presented with no problems at today's exercise session.   There was no workload change during today's exercise session.  Pre-exercise vitals: . Weight kg: 96.2 . Liters of O2: ra . SpO2: 97 . HR: 75 . BP: 128/80 . CBG: na  Exercise vitals: . Highest heartrate:  113 . Lowest oxygen saturation: 91 . Highest blood pressure: 132/80 . Liters of 02: ra  Post-exercise vitals: . SpO2: 97 . HR: 79 . BP: 122/74 . Liters of O2: ra . CBG: na  Dr. Brand Males, Medical Director Dr. Candiss Norse is immediately available during today's Pulmonary Rehab session for [facility] on 01/02/15 at 10:30am class time.

## 2015-01-04 ENCOUNTER — Encounter (HOSPITAL_COMMUNITY)
Admission: RE | Admit: 2015-01-04 | Discharge: 2015-01-04 | Disposition: A | Discharge status: Home / Self Care | Source: Ambulatory Visit | Attending: Internal Medicine | Admitting: Internal Medicine

## 2015-01-04 ENCOUNTER — Other Ambulatory Visit: Payer: Self-pay | Admitting: Family Medicine

## 2015-01-04 DIAGNOSIS — Z5189 Encounter for other specified aftercare: Secondary | ICD-10-CM | POA: Diagnosis not present

## 2015-01-04 NOTE — Progress Notes (Signed)
Darren Mueller completed a Six-Minute Walk Test on 01/04/15 . Nazir walked 1160 feet with 0 breaks.  The Darren Mueller's lowest oxygen saturation was 94% , highest heart rate was 120 , and highest blood pressure was 160/84. The Darren Mueller was on room air. Zebulan stated that nothing hindered their walk test.

## 2015-01-09 ENCOUNTER — Encounter (HOSPITAL_COMMUNITY)

## 2015-01-11 ENCOUNTER — Encounter (HOSPITAL_COMMUNITY)

## 2015-01-16 ENCOUNTER — Encounter (HOSPITAL_COMMUNITY)

## 2015-01-30 ENCOUNTER — Telehealth (HOSPITAL_COMMUNITY): Payer: Self-pay | Admitting: Vascular Surgery

## 2015-01-30 NOTE — Telephone Encounter (Signed)
Pt called he needs to speak to someone about his dosage of his new medication, he has been having leg pain he believes that may be a side effect of the drug. Please advise

## 2015-01-30 NOTE — Telephone Encounter (Signed)
Pt reports pain in legs, jaw pain, and diarrhea, symptoms started on Friday 01/26/15 shortly after dose increase on 01/23/15  CURRENT DOSE : #4 220 mcg BID Pt DID NOT increase titration for this week  Pt did speak with [facility] nurse and was advised to follow up with cardiologist  Please advise

## 2015-01-31 NOTE — Telephone Encounter (Signed)
Pt aware and will continue previous dose x 1 additional week Will call back if symptoms persist

## 2015-01-31 NOTE — Telephone Encounter (Signed)
Can cut back to previous dose and see if improves.

## 2015-02-06 ENCOUNTER — Encounter (HOSPITAL_COMMUNITY)

## 2015-02-06 DIAGNOSIS — Z5189 Encounter for other specified aftercare: Secondary | ICD-10-CM | POA: Insufficient documentation

## 2015-02-06 DIAGNOSIS — I272 Other secondary pulmonary hypertension: Secondary | ICD-10-CM | POA: Insufficient documentation

## 2015-02-06 DIAGNOSIS — Z87891 Personal history of nicotine dependence: Secondary | ICD-10-CM | POA: Insufficient documentation

## 2015-02-08 ENCOUNTER — Encounter (HOSPITAL_COMMUNITY)

## 2015-02-13 ENCOUNTER — Ambulatory Visit (INDEPENDENT_AMBULATORY_CARE_PROVIDER_SITE_OTHER): Payer: Medicare Other | Admitting: Pulmonary Disease

## 2015-02-13 ENCOUNTER — Encounter: Payer: Self-pay | Admitting: Pulmonary Disease

## 2015-02-13 ENCOUNTER — Encounter (HOSPITAL_COMMUNITY)

## 2015-02-13 VITALS — BP 110/62 | HR 80 | Temp 97.4°F | Ht 72.0 in | Wt 208.6 lb

## 2015-02-13 DIAGNOSIS — G4733 Obstructive sleep apnea (adult) (pediatric): Secondary | ICD-10-CM

## 2015-02-13 NOTE — Patient Instructions (Signed)
Will arrange for home sleep testing, and call once the results are available.

## 2015-02-13 NOTE — Progress Notes (Signed)
Subjective:    Patient ID: Darren Mueller, male    DOB: [birth year], 64 y.o.   MRN: [id number]  HPI The patient is a 64 year old male who I've been asked to see for possible obstructive sleep apnea. Has a history of significant pulmonary hypertension, and the question has been raised whether he may have sleep disordered breathing. He has been noted to have loud snoring by his son, as well as an abnormal breathing pattern during sleep. He has rare choking arousals. He has frequent awakenings at night that he blames on his medications, and is not rested in the mornings upon arising. He can easily get sleepy during the day with inactivity, and also dozes in front of his television at night. He denies any sleepiness with driving.  The patient states that his weight is down 8 pounds over the last 2 years, and his Epworth score today is 3    Sleep Questionnaire What time do you typically go to bed?( Between what hours) 1!:30p 1!:30p at 1420 on 02/13/15 by Inge Rise, CMA How long does it take you to fall asleep? 30 min 30 min at 1420 on 02/13/15 by Inge Rise, CMA How many times during the night do you wake up? 3 3 at 1420 on 02/13/15 by Inge Rise, CMA What time do you get out of bed to start your day? 0930 0930 at 1420 on 02/13/15 by Inge Rise, CMA Do you drive or operate heavy machinery in your occupation? No No at 1420 on 02/13/15 by Inge Rise, CMA How much has your weight changed (up or down) over the past two years? (In pounds) 7 lb (3.175 kg) 7 lb (3.175 kg) at 1420 on 02/13/15 by Inge Rise, CMA Have you ever had a sleep study before? No No at 1420 on 02/13/15 by Inge Rise, CMA Do you currently use CPAP? No No at 1420 on 02/13/15 by Inge Rise, CMA Do you wear oxygen at any time? Yes Yes at 1420 on 02/13/15 by Inge Rise, CMA O2 Flow Rate (L/min) 2 L/min 2 L/min at 1420 on 02/13/15 by Inge Rise, CMA   Review of Systems  Constitutional:  Negative for fever and unexpected weight change.  HENT: Negative for congestion, dental problem, ear pain, nosebleeds, postnasal drip, rhinorrhea, sinus pressure, sneezing, sore throat and trouble swallowing.   Eyes: Negative for redness and itching.  Respiratory: Positive for shortness of breath. Negative for cough, chest tightness and wheezing.   Cardiovascular: Negative for palpitations and leg swelling.  Gastrointestinal: Negative for nausea and vomiting.  Genitourinary: Negative for dysuria.  Musculoskeletal: Negative for joint swelling.  Skin: Negative for rash.  Neurological: Negative for headaches.  Hematological: Does not bruise/bleed easily.  Psychiatric/Behavioral: Negative for dysphoric mood. The patient is not nervous/anxious.        Objective:   Physical Exam Constitutional:  Well developed, no acute distress  HENT:  Nares patent without discharge  Oropharynx without exudate, palate and uvula are trimmed  Eyes:  Perrla, eomi, no scleral icterus  Neck:  No JVD, no TMG  Cardiovascular:  Normal rate, regular rhythm, no rubs or gallops.  Prominent P2        Intact distal pulses  Pulmonary :  Normal breath sounds, no stridor or respiratory distress   No rales, rhonchi, or wheezing  Abdominal:  Soft, nondistended, bowel sounds present.  No tenderness noted.   Musculoskeletal:  No lower extremity edema noted.  Lymph  Nodes:  No cervical lymphadenopathy noted  Skin:  No cyanosis noted  Neurologic:  Alert, appropriate, moves all 4 extremities without obvious deficit.         Assessment & Plan:

## 2015-02-13 NOTE — Assessment & Plan Note (Signed)
The patient's history is very suggestive of clinically significant sleep disordered breathing. I have had a long discussion with him about sleep apnea, including its impact to his quality of life and cardiovascular health. We'll need to have a sleep study for diagnosis, and I think he is an excellent candidate for home sleep testing. The patient is agreeable to this approach.

## 2015-02-15 ENCOUNTER — Encounter (HOSPITAL_COMMUNITY)

## 2015-02-16 ENCOUNTER — Ambulatory Visit: Admitting: Family Medicine

## 2015-02-16 ENCOUNTER — Ambulatory Visit (HOSPITAL_COMMUNITY)
Admission: RE | Admit: 2015-02-16 | Discharge: 2015-02-16 | Disposition: A | Discharge status: Home / Self Care | Payer: Medicare Other | Source: Ambulatory Visit | Attending: Cardiology | Admitting: Cardiology

## 2015-02-16 VITALS — BP 122/80 | HR 84 | Wt 208.0 lb

## 2015-02-16 DIAGNOSIS — N183 Chronic kidney disease, stage 3 unspecified: Secondary | ICD-10-CM

## 2015-02-16 DIAGNOSIS — I272 Other secondary pulmonary hypertension: Secondary | ICD-10-CM | POA: Diagnosis not present

## 2015-02-16 DIAGNOSIS — I10 Essential (primary) hypertension: Secondary | ICD-10-CM | POA: Insufficient documentation

## 2015-02-16 DIAGNOSIS — I279 Pulmonary heart disease, unspecified: Secondary | ICD-10-CM | POA: Insufficient documentation

## 2015-02-16 DIAGNOSIS — IMO0002 Reserved for concepts with insufficient information to code with codable children: Secondary | ICD-10-CM

## 2015-02-16 DIAGNOSIS — K7030 Alcoholic cirrhosis of liver without ascites: Secondary | ICD-10-CM | POA: Diagnosis not present

## 2015-02-16 DIAGNOSIS — N189 Chronic kidney disease, unspecified: Secondary | ICD-10-CM | POA: Diagnosis not present

## 2015-02-16 DIAGNOSIS — I159 Secondary hypertension, unspecified: Secondary | ICD-10-CM | POA: Diagnosis not present

## 2015-02-16 DIAGNOSIS — G4733 Obstructive sleep apnea (adult) (pediatric): Secondary | ICD-10-CM | POA: Diagnosis not present

## 2015-02-16 MED ORDER — SELEXIPAG 200 MCG PO TABS
400.0000 ug | ORAL_TABLET | Freq: Two times a day (BID) | ORAL | Status: DC
Start: 2015-02-16 — End: 2015-07-17

## 2015-02-16 MED ORDER — TADALAFIL (PAH) 20 MG PO TABS: ORAL_TABLET | ORAL | Status: DC

## 2015-02-16 NOTE — Progress Notes (Signed)
Patient ID: Darren Mueller, male   DOB: May 14, 1951, 64 y.o.   MRN: [id number] GI : Dr Ardis Hughs PCP: Dr Charlett Blake  Subjective:    Darren Mueller is a 64 yo with history of COPD, portopulmonary hypertension with RV failure, and ETOH cirrhosis presents for followup of his PAH.  [place] 2/14: RA = 13  RV = 63/5/14  PA = 65/24 (41)  PCW = 6  Hepatic wedge = 21  Fick cardiac output/index = 3.8/1.8  PVR = 9.1  FA sat = 94%  PA sat = 58%, 56%  SVC = 54%  PFTs 3/14 FEV1 2.02 L (58%) FVC 2.7L (54%) FEV1/FVC (89%) DLCO 53%  RHC 06/06/14 RA = 5  RV = 73/5/9  PA = 81/25 (46)  PCW = 7  Fick cardiac output/index = 6.0/2.8  PVR = 6.5 [place]  FA sat = 95%  PA sat = 71%, 67%  Unable to get hepatic wedge  Admitted in December 2014 with syncope in the setting of persistent diarrhea. C.diff and O&P negative.  Creatinine was up to 2.7 from 1.7 baseline. He was hydrated. Losartan and lasix stopped.. Event Monitor - NSR 10/26/13.   Follow-up:  Started on treprostinil (06/30/14)  and gradually up titrating. He got up to 1.375 mg TID but unable to tolerate due to flushing, HAs,  and diarrhea.  Now off oral treprostinil and on selexipag. He titrated up to 800 mcg twice a day but had increase flushing, diarrhea, and jaw pain. Now only taking 800 mcg daily. Taking all medications.  Mild dyspnea with exertion. Denies presyncope/syncope. Weight 200-204 pounds.  Has not been to pulmonary rehab lately.   ECHO 12/14 EF 55-60% Peak PA pressure 35. Severe RV dysfunction  ECHO 7/15 EF 60% RV moderately to severely dilated. Moderate HK RVSP 74m HG   6 min walk 01/10/13, 1290 feet  6MW (01/10/14) = 1280 feet (380 m) 6MW (9/15) = 1350 feet (411 m) O2 sats ranged from 89-95% on room air, HR ranged from 100-129. 6MW 02/16/2015)= 404 meters. O2 sats 92-98% HR ranged 75-113    Labs:  10/19/13 Creatinine 1.79 K 4.1  3/15         Cr 1.78 K 4.2 03/17/14    CR 1.23 K 4.0 LFTs ok 06/06/14 K 4.0 Creatinine 1.4  08/08/14 K 4.0 Cr  1.6 09/12/14 K 4.3 Cr 1.8 LFTs ok, HCT 58 12/15 K 4.3 Cr 1.15  12/25/2014: K 4.0 Creatinine 1.24   Current Outpatient Prescriptions on File Prior to Encounter  Medication Sig Dispense Refill  . ADCIRCA 20 MG TABS TAKE 2 TABLETS (40 MG TOTAL) DAILY 180 tablet 2  . clindamycin-benzoyl peroxide (BENZACLIN) gel Apply topically 2 (two) times daily. 50 g 1  . diphenoxylate-atropine (LOMOTIL) 2.5-0.025 MG per tablet Take 1 tablet by mouth 2 (two) times daily. 60 tablet 3  . fluticasone-salmeterol (ADVAIR HFA) 230-21 MCG/ACT inhaler Inhale 2 puffs into the lungs 2 (two) times daily. 1 Inhaler 2  . furosemide (LASIX) 40 MG tablet Take 1 tablet (40 mg total) by mouth as directed. Every Mon, Wed, Fri 30 tablet 6  . levothyroxine (SYNTHROID, LEVOTHROID) 25 MCG tablet TAKE 1 TABLET DAILY 90 tablet 3  . loperamide (ANTI-DIARRHEAL) 2 MG capsule Take 4 mg by mouth daily.     .Marland Kitchenlosartan (COZAAR) 50 MG tablet Take 1 tablet (50 mg total) by mouth daily. 90 tablet 3  . Macitentan (OPSUMIT) 10 MG TABS Take 10 mg by mouth daily. 90 tablet 3  .  Melatonin 3 MG CAPS Take 3 mg by mouth at bedtime.     . mometasone (ELOCON) 0.1 % cream Apply 1 application topically daily. 50 g 1  . mometasone-formoterol (DULERA) 200-5 MCG/ACT AERO Inhale 2 puffs into the lungs 2 (two) times daily. 13 g 2  . omeprazole (PRILOSEC) 40 MG capsule TAKE 1 CAPSULE DAILY 90 capsule 2  . potassium chloride (K-DUR) 10 MEQ tablet Take 1 tablet (10 mEq total) by mouth daily. 360 tablet 3  . Selexipag 200 MCG TABS Take 200 mcg by mouth 2 (two) times daily. Increase to 2 tablets (400 mcg) by mouth twice daily on 3/4 and discontinue Orenitram    . spironolactone (ALDACTONE) 50 MG tablet TAKE 1 TABLET DAILY 90 tablet 0  . SYMAX-SR 0.375 MG 12 hr tablet TAKE 1 TABLET TWICE A DAY 180 tablet 0  . triamcinolone cream (KENALOG) 0.1 % Apply 1 application topically daily as needed. Dermatitis, 90 day supply, 3 tubes 85.2 g 1  . verapamil (VERELAN PM) 180 MG  24 hr capsule Take 1 capsule (180 mg total) by mouth every morning. 90 capsule 1   No current facility-administered medications on file prior to encounter.    Objective:   Weight Range:  Vital Signs:   Pulse Rate:  [84] 84 (04/15 1027) BP: (122)/(80) 122/80 mmHg (04/15 1027) SpO2:  [95 %] 95 % (04/15 1027) Weight:  [208 lb (94.348 kg)] 208 lb (94.348 kg) (04/15 1027)    Weight change: Filed Weights   02/16/15 1027  Weight: 208 lb (94.348 kg)   Physical Exam: General: NAD  Neck: JVP ~7-8 cm, no thyromegaly or thyroid nodule.  Lungs: Clear to auscultation bilaterally with normal respiratory effort.  CV: Nondisplaced PMI. Heart regular  2/6 TR P2 LLSB with right-sided S3. No carotid bruit. Normal pedal pulses.  Abdomen: Soft, nontender, no hepatosplenomegaly, distention.  Ext: warm, no edema. No clubbing Skin: Intact without lesions or rashes.  Neurologic: Alert and oriented x 3.  Psych: Normal affect.  Extremities: No clubbing or cyanosis.  HEENT: Normal. Except for erythematous rash/flushing  Labs: Basic Metabolic Panel: No results for input(s): NA, K, CL, CO2, GLUCOSE, BUN, CREATININE, CALCIUM, MG, PHOS in the last 168 hours.  Liver Function Tests: No results for input(s): AST, ALT, ALKPHOS, BILITOT, PROT, ALBUMIN in the last 168 hours. No results for input(s): LIPASE, AMYLASE in the last 168 hours. No results for input(s): AMMONIA in the last 168 hours.  CBC: No results for input(s): WBC, NEUTROABS, HGB, HCT, MCV, PLT in the last 168 hours.  Cardiac Enzymes: No results for input(s): CKTOTAL, CKMB, CKMBINDEX, TROPONINI in the last 168 hours.  BNP: BNP (last 3 results)  Recent Labs  08/08/14 1115  PROBNP 1109.0*     Other results:  :   Imaging: No results found.    Assessment/Plan    1. PAH: Suspected portopulmonary HTN.  Stable NYHA III on combination therapy: macitentan 10 daily, adcirca 40 mg daily, and selexipag.  Intolerant up titration of  selexipag to 800 mcg twice a day (had increased flushing, jaw pain, and diarrhea) so he cut back to 800 mcg once a day. Today I have asked him to take 400 mcg twice a day and see if he tolerates a little better.    - 6 minute walk walk today -->404 meters.    - Echo next visit.   - Volume status stable. Continue lasix Mon-Wed-Fri and an extra 20 meq Kdur  Mon-Wed-Fri. - I asked him  to return to pulmonary rehab.   2. CKD: Most recent creatinine 1.24.   3. ETOH cirrhosis: Ongoing Alcohol. Needs to stop ETOH completely as this is likely contributing to [facility]. 4. HTN: BP stable. Continue losartan. 5. OSA: Son says that patient gasps in his sleep and sometimes stops breathing. Sleep study has been set up by Dr Gwenette Greet.    Follow up in 2 months with an ECHO.   CLEGG,AMY, NP-C  02/16/2015

## 2015-02-16 NOTE — Progress Notes (Signed)
6 min walk test preformed, pt ambulated 1325 ft (426m, pt tolerated well O2 sat ranged from 9298%, HR ranged from 75-113

## 2015-02-16 NOTE — Patient Instructions (Addendum)
CHANGE Selexipag to 275m, take two tabs twice a day  Your physician recommends that you schedule a follow-up appointment in: 2 months with a echo  Your physician has requested that you have an echocardiogram. Echocardiography is a painless test that uses sound waves to create images of your heart. It provides your doctor with information about the size and shape of your heart and how well your heart's chambers and valves are working. This procedure takes approximately one hour. There are no restrictions for this procedure.

## 2015-02-19 ENCOUNTER — Telehealth (HOSPITAL_COMMUNITY): Payer: Self-pay | Admitting: Cardiology

## 2015-02-19 NOTE — Telephone Encounter (Signed)
Pt called to request a #7 day supply of adcirca be sent into local pharmacy as mail order has a delay in shipment and he has already been with out meds x 5 days Advised will call around to see if they have this med on the shelf however i am doubtful, normally only comes from [email or web address] Does not keep in house  [facility] does have samples and will leave some for patient in the front office  Detailed message left for pt with instructions for pt to pick up ASAP

## 2015-02-20 ENCOUNTER — Encounter (HOSPITAL_COMMUNITY)

## 2015-02-21 ENCOUNTER — Other Ambulatory Visit (HOSPITAL_COMMUNITY): Payer: Self-pay | Admitting: Adult Health

## 2015-02-22 ENCOUNTER — Encounter (HOSPITAL_COMMUNITY)

## 2015-02-23 ENCOUNTER — Other Ambulatory Visit (HOSPITAL_COMMUNITY): Payer: Self-pay | Admitting: *Deleted

## 2015-02-23 ENCOUNTER — Telehealth: Payer: Self-pay | Admitting: Family Medicine

## 2015-02-23 MED ORDER — DOXEPIN HCL 6 MG PO TABS: 1.0000 | ORAL_TABLET | Freq: Every day | ORAL | Status: DC

## 2015-02-23 NOTE — Telephone Encounter (Signed)
So I am not opposed to taking over his Silenor just ask him to clarify where he got it before and when was the last time he used it and then can rx 6 mg tab 1 tab po qhs, disp #30 with 2 rf

## 2015-02-23 NOTE — Telephone Encounter (Signed)
Patient was called and he confirmed Dr. Charlett Blake had prescribed for him for sleep.  Ok to fill.

## 2015-02-23 NOTE — Telephone Encounter (Signed)
Called left message to call back 

## 2015-02-23 NOTE — Telephone Encounter (Signed)
[facility] requesting refill on Silenor 6 mg per patient request.  This medication is not on current list but is on the historical list.  Pharmacy states they have never filled at their pharmacy before.  Advise on refill please.   Last Office Visit 11/17/14.

## 2015-02-26 ENCOUNTER — Other Ambulatory Visit (HOSPITAL_COMMUNITY): Payer: Self-pay | Admitting: *Deleted

## 2015-02-26 DIAGNOSIS — I10 Essential (primary) hypertension: Secondary | ICD-10-CM

## 2015-02-26 MED ORDER — LOSARTAN POTASSIUM 50 MG PO TABS: 50.0000 mg | ORAL_TABLET | Freq: Every day | ORAL | Status: DC

## 2015-02-26 MED ORDER — FUROSEMIDE 40 MG PO TABS: ORAL_TABLET | ORAL | Status: DC

## 2015-02-27 ENCOUNTER — Encounter (HOSPITAL_COMMUNITY)
Admission: RE | Admit: 2015-02-27 | Discharge: 2015-02-27 | Disposition: A | Discharge status: Home / Self Care | Source: Ambulatory Visit | Attending: Internal Medicine | Admitting: Internal Medicine

## 2015-02-27 DIAGNOSIS — Z87891 Personal history of nicotine dependence: Secondary | ICD-10-CM | POA: Diagnosis not present

## 2015-02-27 DIAGNOSIS — Z5189 Encounter for other specified aftercare: Secondary | ICD-10-CM | POA: Diagnosis not present

## 2015-02-27 DIAGNOSIS — I272 Other secondary pulmonary hypertension: Secondary | ICD-10-CM | POA: Diagnosis not present

## 2015-02-27 NOTE — Progress Notes (Signed)
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 Darren Mueller increased his stamina and strength will in the program as evidenced by his tolerance of workload increases. However he was not feeling well during his discharge 6 min walk test as reflected by his inability to increase his footage walked as compared to his admission walk test. Post graduation, Darren Mueller was intending to enroll in our maintenance program, but has yet to do so. He has missed several orientation appointments. Darren Mueller has been encouraged to continue to exercise at home if he is unable to join the maintenance program.

## 2015-02-27 NOTE — Addendum Note (Signed)
Encounter addended by: Benedetto Goad, RN on: 02/27/2015  8:41 AM<BR>     Documentation filed: Notes Section

## 2015-03-01 ENCOUNTER — Encounter (HOSPITAL_COMMUNITY)
Admission: RE | Admit: 2015-03-01 | Discharge: 2015-03-01 | Disposition: A | Discharge status: Home / Self Care | Source: Ambulatory Visit | Attending: Internal Medicine | Admitting: Internal Medicine

## 2015-03-02 ENCOUNTER — Encounter: Payer: Self-pay | Admitting: Family Medicine

## 2015-03-02 ENCOUNTER — Ambulatory Visit (INDEPENDENT_AMBULATORY_CARE_PROVIDER_SITE_OTHER): Payer: Medicare Other | Admitting: Family Medicine

## 2015-03-02 VITALS — BP 120/80 | HR 99 | Temp 97.6°F | Ht 72.0 in | Wt 204.2 lb

## 2015-03-02 DIAGNOSIS — I10 Essential (primary) hypertension: Secondary | ICD-10-CM

## 2015-03-02 DIAGNOSIS — E782 Mixed hyperlipidemia: Secondary | ICD-10-CM | POA: Diagnosis not present

## 2015-03-02 DIAGNOSIS — N183 Chronic kidney disease, stage 3 unspecified: Secondary | ICD-10-CM

## 2015-03-02 DIAGNOSIS — I509 Heart failure, unspecified: Secondary | ICD-10-CM

## 2015-03-02 DIAGNOSIS — R739 Hyperglycemia, unspecified: Secondary | ICD-10-CM | POA: Diagnosis not present

## 2015-03-02 DIAGNOSIS — G4700 Insomnia, unspecified: Secondary | ICD-10-CM | POA: Diagnosis not present

## 2015-03-02 DIAGNOSIS — N189 Chronic kidney disease, unspecified: Secondary | ICD-10-CM

## 2015-03-02 DIAGNOSIS — E039 Hypothyroidism, unspecified: Secondary | ICD-10-CM

## 2015-03-02 DIAGNOSIS — I272 Other secondary pulmonary hypertension: Secondary | ICD-10-CM

## 2015-03-02 DIAGNOSIS — R197 Diarrhea, unspecified: Secondary | ICD-10-CM

## 2015-03-02 DIAGNOSIS — L309 Dermatitis, unspecified: Secondary | ICD-10-CM

## 2015-03-02 DIAGNOSIS — I50810 Right heart failure, unspecified: Secondary | ICD-10-CM

## 2015-03-02 DIAGNOSIS — K219 Gastro-esophageal reflux disease without esophagitis: Secondary | ICD-10-CM

## 2015-03-02 DIAGNOSIS — R7309 Other abnormal glucose: Secondary | ICD-10-CM | POA: Diagnosis not present

## 2015-03-02 DIAGNOSIS — I2729 Other secondary pulmonary hypertension: Secondary | ICD-10-CM

## 2015-03-02 DIAGNOSIS — I159 Secondary hypertension, unspecified: Secondary | ICD-10-CM

## 2015-03-02 LAB — COMPREHENSIVE METABOLIC PANEL
ALBUMIN: 4 g/dL (ref 3.5–5.2)
ALT: 51 U/L (ref 0–53)
AST: 40 U/L — ABNORMAL HIGH (ref 0–37)
Alkaline Phosphatase: 146 U/L — ABNORMAL HIGH (ref 39–117)
BUN: 20 mg/dL (ref 6–23)
CO2: 20 meq/L (ref 19–32)
Calcium: 8.8 mg/dL (ref 8.4–10.5)
Chloride: 106 mEq/L (ref 96–112)
Creat: 1.36 mg/dL — ABNORMAL HIGH (ref 0.50–1.35)
GLUCOSE: 91 mg/dL (ref 70–99)
Potassium: 4.3 mEq/L (ref 3.5–5.3)
SODIUM: 137 meq/L (ref 135–145)
Total Bilirubin: 1 mg/dL (ref 0.2–1.2)
Total Protein: 7.5 g/dL (ref 6.0–8.3)

## 2015-03-02 LAB — LIPID PANEL
Cholesterol: 178 mg/dL (ref 0–200)
HDL: 71 mg/dL (ref >=40)
LDL Cholesterol: 90 mg/dL (ref 0–99)
Total CHOL/HDL Ratio: 2.5 Ratio
Triglycerides: 83 mg/dL (ref <150)
VLDL: 17 mg/dL (ref 0–40)

## 2015-03-02 LAB — HEMOGLOBIN A1C
Hgb A1c MFr Bld: 5.4 % (ref <5.7)
Mean Plasma Glucose: 108 mg/dL (ref <117)

## 2015-03-02 MED ORDER — DOXEPIN HCL 10 MG PO CAPS: 10.0000 mg | ORAL_CAPSULE | Freq: Every day | ORAL | Status: DC

## 2015-03-02 MED ORDER — MELATONIN 3 MG PO CAPS
9.0000 mg | ORAL_CAPSULE | Freq: Every day | ORAL | Status: DC
Start: 2015-03-02 — End: 2015-08-08

## 2015-03-02 MED ORDER — TRIAMCINOLONE ACETONIDE 0.1 % EX CREA: 1.0000 "application " | TOPICAL_CREAM | Freq: Every day | CUTANEOUS | Status: DC | PRN

## 2015-03-02 MED ORDER — METRONIDAZOLE 1 % EX GEL: Freq: Every day | CUTANEOUS | Status: DC

## 2015-03-02 NOTE — Patient Instructions (Addendum)
Probiotics daily, [employer] or [facility], or you can order online at [email or web address], [place], 10 strain probiotics  Melatonin 2-10 mg consider the liquid  Diarrhea Diarrhea is frequent loose and watery bowel movements. It can cause you to feel weak and dehydrated. Dehydration can cause you to become tired and thirsty, have a dry mouth, and have decreased urination that often is dark yellow. Diarrhea is a sign of another problem, most often an infection that will not last long. In most cases, diarrhea typically lasts 2-3 days. However, it can last longer if it is a sign of something more serious. It is important to treat your diarrhea as directed by your caregiver to lessen or prevent future episodes of diarrhea. CAUSES  Some common causes include:  Gastrointestinal infections caused by viruses, bacteria, or parasites.  Food poisoning or food allergies.  Certain medicines, such as antibiotics, chemotherapy, and laxatives.  Artificial sweeteners and fructose.  Digestive disorders. HOME CARE INSTRUCTIONS  Ensure adequate fluid intake (hydration): Have 1 cup (8 oz) of fluid for each diarrhea episode. Avoid fluids that contain simple sugars or sports drinks, fruit juices, whole milk products, and sodas. Your urine should be clear or pale yellow if you are drinking enough fluids. Hydrate with an oral rehydration solution that you can purchase at pharmacies, retail stores, and online. You can prepare an oral rehydration solution at home by mixing the following ingredients together:   - tsp table salt.   tsp baking soda.   tsp salt substitute containing potassium chloride.  1  tablespoons sugar.  1 L (34 oz) of water.  Certain foods and beverages may increase the speed at which food moves through the gastrointestinal (GI) tract. These foods and beverages should be avoided and include:  Caffeinated and alcoholic beverages.  High-fiber foods, such as raw fruits  and vegetables, nuts, seeds, and whole grain breads and cereals.  Foods and beverages sweetened with sugar alcohols, such as xylitol, sorbitol, and mannitol.  Some foods may be well tolerated and may help thicken stool including:  Starchy foods, such as rice, toast, pasta, low-sugar cereal, oatmeal, grits, baked potatoes, crackers, and bagels.  Bananas.  Applesauce.  Add probiotic-rich foods to help increase healthy bacteria in the GI tract, such as yogurt and fermented milk products.  Wash your hands well after each diarrhea episode.  Only take over-the-counter or prescription medicines as directed by your caregiver.  Take a warm bath to relieve any burning or pain from frequent diarrhea episodes. SEEK IMMEDIATE MEDICAL CARE IF:   You are unable to keep fluids down.  You have persistent vomiting.  You have blood in your stool, or your stools are black and tarry.  You do not urinate in 6-8 hours, or there is only a small amount of very dark urine.  You have abdominal pain that increases or localizes.  You have weakness, dizziness, confusion, or light-headedness.  You have a severe headache.  Your diarrhea gets worse or does not get better.  You have a fever or persistent symptoms for more than 2-3 days.  You have a fever and your symptoms suddenly get worse. MAKE SURE YOU:   Understand these instructions.  Will watch your condition.  Will get help right away if you are not doing well or get worse. Document Released: 10/10/2002 Document Revised: 03/06/2014 Document Reviewed: 06/27/2012 [facility] Patient Information 2015 [place], [place]. This information is not intended to replace advice given to you by your health care provider. Make sure  you discuss any questions you have with your health care provider.

## 2015-03-02 NOTE — Progress Notes (Signed)
Pre visit review using our clinic review tool, if applicable. No additional management support is needed unless otherwise documented below in the visit note.

## 2015-03-03 LAB — CBC
HCT: 38.8 % — ABNORMAL LOW (ref 39.0–52.0)
HEMOGLOBIN: 13.1 g/dL (ref 13.0–17.0)
MCH: 34.3 pg — AB (ref 26.0–34.0)
MCHC: 33.8 g/dL (ref 30.0–36.0)
MCV: 101.6 fL — ABNORMAL HIGH (ref 78.0–100.0)
Platelets: 47 10*3/uL — ABNORMAL LOW (ref 150–400)
RBC: 3.82 MIL/uL — ABNORMAL LOW (ref 4.22–5.81)
RDW: 14.6 % (ref 11.5–15.5)
WBC: 2.2 10*3/uL — ABNORMAL LOW (ref 4.0–10.5)

## 2015-03-03 LAB — TSH: TSH: 2.703 u[IU]/mL (ref 0.350–4.500)

## 2015-03-06 ENCOUNTER — Encounter (HOSPITAL_COMMUNITY): Payer: Medicare Other

## 2015-03-06 ENCOUNTER — Encounter: Payer: Self-pay | Admitting: Family Medicine

## 2015-03-06 DIAGNOSIS — G4730 Sleep apnea, unspecified: Secondary | ICD-10-CM | POA: Diagnosis not present

## 2015-03-06 DIAGNOSIS — Z87891 Personal history of nicotine dependence: Secondary | ICD-10-CM | POA: Insufficient documentation

## 2015-03-06 DIAGNOSIS — I272 Other secondary pulmonary hypertension: Secondary | ICD-10-CM | POA: Insufficient documentation

## 2015-03-06 DIAGNOSIS — Z5189 Encounter for other specified aftercare: Secondary | ICD-10-CM | POA: Insufficient documentation

## 2015-03-08 ENCOUNTER — Encounter (HOSPITAL_COMMUNITY)
Admission: RE | Admit: 2015-03-08 | Discharge: 2015-03-08 | Disposition: A | Discharge status: Home / Self Care | Payer: Medicare Other | Source: Ambulatory Visit | Attending: Internal Medicine | Admitting: Internal Medicine

## 2015-03-11 NOTE — Progress Notes (Signed)
Darren Mueller  [id number] 07-25-1951 03/11/2015      Progress Note-Follow Up  Subjective  Chief Complaint  Chief Complaint  Patient presents with  . Follow-up    sleep problems    HPI  Patient is a 64 y.o. male in today for routine medical care. Patient is in today for follow-up on numerous medical problems. He continues to struggle with insomnia). Has trouble falling asleep and staying asleep. Medications have been marginally helpful. No recent hospitalization or illness. Continues to follow with cardiology for his and heart failure. Has had a recent medication change. He appears to be tolerating marginally. No bloody or tarry. Denies CP/palp/HA/congestion/fevers GU c/o. Taking meds as prescribed  Past Medical History  Diagnosis Date  . Hypertension   . Seizures   . Hx of colonic polyps   . GERD (gastroesophageal reflux disease)   . Gout   . Alcoholic cirrhosis of liver with ascites 2014  . Unspecified hypothyroidism 01/25/2013  . Unspecified pleural effusion   . Other chronic pulmonary heart diseases   . Hypopotassemia   . Renal insufficiency 07/18/2013  . Diarrhea 09/04/2013  . Otalgia 11/28/2013    Past Surgical History  Procedure Laterality Date  . Colonoscopy N/A 12/08/2013    Procedure: COLONOSCOPY;  Surgeon: Milus Banister, MD;  Location: [facility] ENDOSCOPY;  Service: Endoscopy;  Laterality: N/A;  . Right heart catheterization Right 06/06/2014    Procedure: RIGHT HEART CATH;  Surgeon: Jolaine Artist, MD;  Location: [facility] CATH LAB;  Service: Cardiovascular;  Laterality: Right;  . Uvulopalatopharyngoplasty  1999    Family History  Problem Relation Age of Onset  . Heart disease Mother   . Prostate cancer Father   . Colon cancer Neg Hx   . Heart attack Mother   . Hypertension Mother     History   Social History  . Marital Status: Divorced    Spouse Name: N/A  . Number of Children: 3  . Years of Education: N/A   Occupational History  . Retired     Social research officer, government    Social History Main Topics  . Smoking status: Former Smoker -- 2.00 packs/day for 5 years    Types: Cigarettes    Quit date: 09/22/1979  . Smokeless tobacco: Never Used  . Alcohol Use: 0.0 oz/week    0 Standard drinks or equivalent per week     Comment: 2 vodka tonic mon, wed, friday  . Drug Use: No  . Sexual Activity: Not on file   Other Topics Concern  . Not on file   Social History Narrative   0 caffeine drinks daily     Current Outpatient Prescriptions on File Prior to Visit  Medication Sig Dispense Refill  . diphenoxylate-atropine (LOMOTIL) 2.5-0.025 MG per tablet Take 1 tablet by mouth 2 (two) times daily. 60 tablet 3  . fluticasone-salmeterol (ADVAIR HFA) 230-21 MCG/ACT inhaler Inhale 2 puffs into the lungs 2 (two) times daily. 1 Inhaler 2  . furosemide (LASIX) 40 MG tablet Take one tablet every Monday, Wednesday, and Friday. 90 tablet 3  . levothyroxine (SYNTHROID, LEVOTHROID) 25 MCG tablet TAKE 1 TABLET DAILY 90 tablet 3  . loperamide (ANTI-DIARRHEAL) 2 MG capsule Take 4 mg by mouth daily.     Marland Kitchen losartan (COZAAR) 50 MG tablet Take 1 tablet (50 mg total) by mouth daily. 90 tablet 3  . Macitentan (OPSUMIT) 10 MG TABS Take 10 mg by mouth daily. 90 tablet 3  . mometasone (ELOCON) 0.1 % cream Apply  1 application topically daily. 50 g 1  . omeprazole (PRILOSEC) 40 MG capsule TAKE 1 CAPSULE DAILY 90 capsule 2  . potassium chloride (K-DUR) 10 MEQ tablet Take 1 tablet (10 mEq total) by mouth daily. 360 tablet 3  . Selexipag 200 MCG TABS Take 400 mcg by mouth 2 (two) times daily. 60 tablet 0  . spironolactone (ALDACTONE) 50 MG tablet TAKE 1 TABLET DAILY 90 tablet 0  . Tadalafil, PAH, (ADCIRCA) 20 MG TABS TAKE 2 TABLETS (40 MG TOTAL) DAILY 180 tablet 2  . verapamil (VERELAN PM) 180 MG 24 hr capsule Take 1 capsule (180 mg total) by mouth every morning. 90 capsule 1  . Doxepin HCl 6 MG TABS Take 1 tablet (6 mg total) by mouth at bedtime. (Patient not taking: Reported on 03/02/2015)  30 tablet 1   No current facility-administered medications on file prior to visit.    Allergies  Allergen Reactions  . Aspirin Other (See Comments)    hypertension    Review of Systems  Review of Systems  Constitutional: Positive for malaise/fatigue. Negative for fever.  HENT: Negative for congestion.   Eyes: Negative for discharge.  Respiratory: Positive for shortness of breath.   Cardiovascular: Negative for chest pain, palpitations and leg swelling.  Gastrointestinal: Negative for nausea, abdominal pain and diarrhea.  Genitourinary: Negative for dysuria.  Musculoskeletal: Negative for falls.  Skin: Negative for rash.  Neurological: Negative for loss of consciousness and headaches.  Endo/Heme/Allergies: Negative for polydipsia.  Psychiatric/Behavioral: Negative for depression and suicidal ideas. The patient is nervous/anxious and has insomnia.     Objective  BP 120/80 mmHg  Pulse 99  Temp(Src) 97.6 F (36.4 C) (Oral)  Ht 6' (1.829 m)  Wt 204 lb 4 oz (92.647 kg)  BMI 27.70 kg/m2  SpO2 96%  Physical Exam  Physical Exam  Constitutional: He is oriented to person, place, and time and well-developed, well-nourished, and in no distress. No distress.  HENT:  Head: Normocephalic and atraumatic.  Eyes: Conjunctivae are normal.  Neck: Neck supple. No thyromegaly present.  Cardiovascular: Normal rate, regular rhythm and normal heart sounds.   Pulmonary/Chest: Effort normal and breath sounds normal. No respiratory distress.  Abdominal: He exhibits no distension and no mass. There is no tenderness.  Musculoskeletal: He exhibits no edema.  Neurological: He is alert and oriented to person, place, and time.  Skin: Skin is warm.  Psychiatric: Memory, affect and judgment normal.    Lab Results  Component Value Date   TSH 2.703 03/02/2015   Lab Results  Component Value Date   WBC 2.2* 03/02/2015   HGB 13.1 03/02/2015   HCT 38.8* 03/02/2015   MCV 101.6* 03/02/2015   PLT  47* 03/02/2015   Lab Results  Component Value Date   CREATININE 1.36* 03/02/2015   BUN 20 03/02/2015   NA 137 03/02/2015   K 4.3 03/02/2015   CL 106 03/02/2015   CO2 20 03/02/2015   Lab Results  Component Value Date   ALT 51 03/02/2015   AST 40* 03/02/2015   ALKPHOS 146* 03/02/2015   BILITOT 1.0 03/02/2015   Lab Results  Component Value Date   CHOL 178 03/02/2015   Lab Results  Component Value Date   HDL 71 03/02/2015   Lab Results  Component Value Date   LDLCALC 90 03/02/2015   Lab Results  Component Value Date   TRIG 83 03/02/2015   Lab Results  Component Value Date   CHOLHDL 2.5 03/02/2015  Assessment & Plan  Hypertension Well controlled, no changes to meds. Encouraged heart healthy diet such as the DASH diet and exercise as tolerated.    GERD (gastroesophageal reflux disease) Avoid offending foods, start probiotics. Do not eat large meals in late evening and consider raising head of bed.    Chronic renal insufficiency, stage III (moderate) Stable , actually slightly improved since last check. Will continue to monitor   Diarrhea Persists but frequency is improved.    Right heart failure due to pulmonary hypertension Patient stable, no recent hospitalization, follows closely with cardiology.    Hypothyroidism On Levothyroxine, continue to monitor   Insomnia Encouraged good sleep hygiene such as dark, quiet room. No blue/green glowing lights such as computer screens in bedroom. No alcohol or stimulants in evening. Cut down on caffeine as able. Regular exercise is helpful but not just prior to bed time. Continue Doxepin for now

## 2015-03-11 NOTE — Assessment & Plan Note (Signed)
Avoid offending foods, start probiotics. Do not eat large meals in late evening and consider raising head of bed.  

## 2015-03-11 NOTE — Assessment & Plan Note (Signed)
Stable , actually slightly improved since last check. Will continue to monitor

## 2015-03-11 NOTE — Assessment & Plan Note (Signed)
On Levothyroxine, continue to monitor

## 2015-03-11 NOTE — Assessment & Plan Note (Signed)
Encouraged good sleep hygiene such as dark, quiet room. No blue/green glowing lights such as computer screens in bedroom. No alcohol or stimulants in evening. Cut down on caffeine as able. Regular exercise is helpful but not just prior to bed time. Continue Doxepin for now

## 2015-03-11 NOTE — Assessment & Plan Note (Signed)
Patient stable, no recent hospitalization, follows closely with cardiology.

## 2015-03-11 NOTE — Assessment & Plan Note (Signed)
Well controlled, no changes to meds. Encouraged heart healthy diet such as the DASH diet and exercise as tolerated.

## 2015-03-11 NOTE — Assessment & Plan Note (Signed)
Persists but frequency is improved.

## 2015-03-13 ENCOUNTER — Encounter (HOSPITAL_COMMUNITY)
Admission: RE | Admit: 2015-03-13 | Discharge: 2015-03-13 | Disposition: A | Discharge status: Home / Self Care | Payer: Medicare Other | Source: Ambulatory Visit | Attending: Internal Medicine | Admitting: Internal Medicine

## 2015-03-15 ENCOUNTER — Encounter (HOSPITAL_COMMUNITY)
Admission: RE | Admit: 2015-03-15 | Discharge: 2015-03-15 | Disposition: A | Discharge status: Home / Self Care | Payer: Medicare Other | Source: Ambulatory Visit | Attending: Internal Medicine | Admitting: Internal Medicine

## 2015-03-15 DIAGNOSIS — G4730 Sleep apnea, unspecified: Secondary | ICD-10-CM | POA: Diagnosis not present

## 2015-03-16 ENCOUNTER — Telehealth: Payer: Self-pay | Admitting: Pulmonary Disease

## 2015-03-16 NOTE — Telephone Encounter (Signed)
Pt needs ov to review sleep study

## 2015-03-16 NOTE — Telephone Encounter (Signed)
lmtcb

## 2015-03-19 ENCOUNTER — Other Ambulatory Visit: Payer: Self-pay | Admitting: *Deleted

## 2015-03-19 DIAGNOSIS — G4733 Obstructive sleep apnea (adult) (pediatric): Secondary | ICD-10-CM

## 2015-03-20 ENCOUNTER — Encounter (HOSPITAL_COMMUNITY)
Admission: RE | Admit: 2015-03-20 | Discharge: 2015-03-20 | Disposition: A | Discharge status: Home / Self Care | Payer: Medicare Other | Source: Ambulatory Visit | Attending: Internal Medicine | Admitting: Internal Medicine

## 2015-03-20 NOTE — Telephone Encounter (Signed)
Pt returned call  [phone]

## 2015-03-20 NOTE — Telephone Encounter (Signed)
Pt has been scheduled for 03/23/15 at 10:15am. Nothing further was needed.

## 2015-03-20 NOTE — Telephone Encounter (Signed)
[email or web address] x1 

## 2015-03-22 ENCOUNTER — Encounter (HOSPITAL_COMMUNITY)
Admission: RE | Admit: 2015-03-22 | Discharge: 2015-03-22 | Disposition: A | Discharge status: Home / Self Care | Payer: Medicare Other | Source: Ambulatory Visit | Attending: Internal Medicine | Admitting: Internal Medicine

## 2015-03-22 ENCOUNTER — Other Ambulatory Visit: Payer: Self-pay | Admitting: Family Medicine

## 2015-03-23 ENCOUNTER — Ambulatory Visit (INDEPENDENT_AMBULATORY_CARE_PROVIDER_SITE_OTHER): Payer: Medicare Other | Admitting: Pulmonary Disease

## 2015-03-23 ENCOUNTER — Encounter: Payer: Self-pay | Admitting: Pulmonary Disease

## 2015-03-23 VITALS — BP 116/72 | HR 71 | Temp 97.6°F | Ht 72.0 in | Wt 211.0 lb

## 2015-03-23 DIAGNOSIS — G4733 Obstructive sleep apnea (adult) (pediatric): Secondary | ICD-10-CM | POA: Diagnosis not present

## 2015-03-23 NOTE — Assessment & Plan Note (Signed)
The patient has mild obstructive sleep apnea that I suspect is having very little impact on his underlying pulmonary hypertension. He does have desaturation that will need to be treated if he decides to treat his sleep apnea conservatively.  He is unsure if this is impacting his sleep or daytime alertness significantly, and would like to take some time to think about his options of a dental appliance or C Pap versus working on weight reduction and wearing oxygen alone to treat his nocturnal desaturation. There are some studies that show that nocturnal oxygen may adequately treat mild sleep apnea. The patient will consider all that we have discussed, and would also like to discuss with his cardiologist.

## 2015-03-23 NOTE — Progress Notes (Signed)
Subjective:    Patient ID: Darren Mueller, male    DOB: 1951-09-18, 64 y.o.   MRN: [id number]  HPI Patient comes in today for follow-up of his recent sleep study. He was found to have mild OSA, with an AHI of 12 events per hour. He did have oxygen desaturation as low as 82%, and spent 115 minutes during the night less than 90%. I have reviewed the study with him in detail, and answered all of his questions.   Review of Systems  Constitutional: Negative for fever and unexpected weight change.  HENT: Negative for congestion, dental problem, ear pain, nosebleeds, postnasal drip, rhinorrhea, sinus pressure, sneezing, sore throat and trouble swallowing.   Eyes: Negative for redness and itching.  Respiratory: Negative for cough, chest tightness, shortness of breath and wheezing.   Cardiovascular: Negative for palpitations and leg swelling.  Gastrointestinal: Negative for nausea and vomiting.  Genitourinary: Negative for dysuria.  Musculoskeletal: Negative for joint swelling.  Skin: Negative for rash.  Neurological: Negative for headaches.  Hematological: Does not bruise/bleed easily.  Psychiatric/Behavioral: Negative for dysphoric mood. The patient is not nervous/anxious.        Objective:   Physical Exam Well-developed male in no acute distress Nose without purulence or discharge noted Neck without lymphadenopathy or thyromegaly Lower extremities with minimal edema, no cyanosis Alert and oriented, moves all 4 extremities.       Assessment & Plan:

## 2015-03-23 NOTE — Patient Instructions (Signed)
Think about the varioius treatments we discussed for treatment of your mild sleep apnea.  Trial of weight loss with oxygen at night, dental appliance, and cpap.  Please call me next week with your decision. Will go ahead and schedule for an overnight oxygen test, since your insurance will not allow [place] to order oxygen based on a sleep study.

## 2015-03-26 DIAGNOSIS — L309 Dermatitis, unspecified: Secondary | ICD-10-CM | POA: Diagnosis not present

## 2015-03-26 DIAGNOSIS — L719 Rosacea, unspecified: Secondary | ICD-10-CM | POA: Diagnosis not present

## 2015-03-27 ENCOUNTER — Encounter (HOSPITAL_COMMUNITY)
Admission: RE | Admit: 2015-03-27 | Discharge: 2015-03-27 | Disposition: A | Discharge status: Home / Self Care | Payer: Medicare Other | Source: Ambulatory Visit | Attending: Internal Medicine | Admitting: Internal Medicine

## 2015-03-29 ENCOUNTER — Encounter (HOSPITAL_COMMUNITY): Payer: Medicare Other

## 2015-03-30 ENCOUNTER — Other Ambulatory Visit: Payer: Self-pay | Admitting: Family Medicine

## 2015-04-02 ENCOUNTER — Other Ambulatory Visit: Payer: Self-pay | Admitting: Family Medicine

## 2015-04-03 ENCOUNTER — Encounter (HOSPITAL_COMMUNITY)
Admission: RE | Admit: 2015-04-03 | Discharge: 2015-04-03 | Disposition: A | Discharge status: Home / Self Care | Payer: Medicare Other | Source: Ambulatory Visit | Attending: Internal Medicine | Admitting: Internal Medicine

## 2015-04-03 ENCOUNTER — Other Ambulatory Visit (HOSPITAL_COMMUNITY): Payer: Self-pay | Admitting: Cardiology

## 2015-04-03 DIAGNOSIS — I5022 Chronic systolic (congestive) heart failure: Secondary | ICD-10-CM

## 2015-04-05 ENCOUNTER — Encounter (HOSPITAL_COMMUNITY)
Admission: RE | Admit: 2015-04-05 | Discharge: 2015-04-05 | Disposition: A | Discharge status: Home / Self Care | Payer: Medicare Other | Source: Ambulatory Visit | Attending: Internal Medicine | Admitting: Internal Medicine

## 2015-04-05 DIAGNOSIS — Z5189 Encounter for other specified aftercare: Secondary | ICD-10-CM | POA: Insufficient documentation

## 2015-04-05 DIAGNOSIS — Z87891 Personal history of nicotine dependence: Secondary | ICD-10-CM | POA: Insufficient documentation

## 2015-04-05 DIAGNOSIS — I272 Other secondary pulmonary hypertension: Secondary | ICD-10-CM | POA: Diagnosis not present

## 2015-04-09 ENCOUNTER — Telehealth: Payer: Self-pay | Admitting: Pulmonary Disease

## 2015-04-09 NOTE — Telephone Encounter (Signed)
[email or web address] x1 

## 2015-04-09 NOTE — Telephone Encounter (Signed)
Pt's ONO showed low sat of 76%, but only spent 4mn less than 88%  Mindy, please let pt know above results. Work on weight loss, and let [place] if he wishes to treat sleep apnea more aggressively

## 2015-04-09 NOTE — Telephone Encounter (Signed)
Pt returning call.Darren Mueller ° °

## 2015-04-10 ENCOUNTER — Encounter (HOSPITAL_COMMUNITY): Payer: Medicare Other

## 2015-04-10 NOTE — Telephone Encounter (Signed)
I spoke with patient about results and he verbalized understanding and had no questions 

## 2015-04-12 ENCOUNTER — Encounter (HOSPITAL_COMMUNITY)
Admission: RE | Admit: 2015-04-12 | Discharge: 2015-04-12 | Disposition: A | Discharge status: Home / Self Care | Payer: Medicare Other | Source: Ambulatory Visit | Attending: Internal Medicine | Admitting: Internal Medicine

## 2015-04-16 ENCOUNTER — Telehealth: Payer: Self-pay | Admitting: Family Medicine

## 2015-04-16 NOTE — Telephone Encounter (Signed)
Only med referenced in dermatolgy note was Doxycycline caps. Is that what he wants? Or does he want refill on one of his steroid creams such as Elocon/Mometasone?

## 2015-04-16 NOTE — Telephone Encounter (Signed)
Relation to pt: self  Call back number: [phone]   Reason for call:  Pt states he is unhappy with dermatologist referral. Pt requested a medication refill (pt does not know the name) and dermatologist has yet to refill meds. Pt in need of clinical advice. Advised pt to find out the name of medication pt states we should have medication on file.

## 2015-04-17 ENCOUNTER — Encounter (HOSPITAL_COMMUNITY)
Admission: RE | Admit: 2015-04-17 | Discharge: 2015-04-17 | Disposition: A | Discharge status: Home / Self Care | Payer: Medicare Other | Source: Ambulatory Visit | Attending: Internal Medicine | Admitting: Internal Medicine

## 2015-04-17 ENCOUNTER — Ambulatory Visit (HOSPITAL_COMMUNITY)
Admission: RE | Admit: 2015-04-17 | Discharge: 2015-04-17 | Disposition: A | Discharge status: Home / Self Care | Payer: Medicare Other | Source: Ambulatory Visit | Attending: Internal Medicine | Admitting: Internal Medicine

## 2015-04-17 ENCOUNTER — Ambulatory Visit (HOSPITAL_BASED_OUTPATIENT_CLINIC_OR_DEPARTMENT_OTHER)
Admission: RE | Admit: 2015-04-17 | Discharge: 2015-04-17 | Disposition: A | Discharge status: Home / Self Care | Payer: Medicare Other | Source: Ambulatory Visit | Attending: Internal Medicine | Admitting: Internal Medicine

## 2015-04-17 VITALS — BP 120/68 | HR 88 | Wt 209.5 lb

## 2015-04-17 DIAGNOSIS — I509 Heart failure, unspecified: Secondary | ICD-10-CM | POA: Diagnosis not present

## 2015-04-17 DIAGNOSIS — J449 Chronic obstructive pulmonary disease, unspecified: Secondary | ICD-10-CM | POA: Insufficient documentation

## 2015-04-17 DIAGNOSIS — D61818 Other pancytopenia: Secondary | ICD-10-CM

## 2015-04-17 DIAGNOSIS — I2721 Secondary pulmonary arterial hypertension: Secondary | ICD-10-CM

## 2015-04-17 DIAGNOSIS — Z79899 Other long term (current) drug therapy: Secondary | ICD-10-CM | POA: Insufficient documentation

## 2015-04-17 DIAGNOSIS — I272 Other secondary pulmonary hypertension: Secondary | ICD-10-CM

## 2015-04-17 DIAGNOSIS — I279 Pulmonary heart disease, unspecified: Secondary | ICD-10-CM

## 2015-04-17 DIAGNOSIS — I5022 Chronic systolic (congestive) heart failure: Secondary | ICD-10-CM | POA: Diagnosis not present

## 2015-04-17 DIAGNOSIS — I082 Rheumatic disorders of both aortic and tricuspid valves: Secondary | ICD-10-CM | POA: Diagnosis not present

## 2015-04-17 DIAGNOSIS — G4733 Obstructive sleep apnea (adult) (pediatric): Secondary | ICD-10-CM | POA: Diagnosis not present

## 2015-04-17 DIAGNOSIS — I129 Hypertensive chronic kidney disease with stage 1 through stage 4 chronic kidney disease, or unspecified chronic kidney disease: Secondary | ICD-10-CM | POA: Insufficient documentation

## 2015-04-17 DIAGNOSIS — N189 Chronic kidney disease, unspecified: Secondary | ICD-10-CM | POA: Diagnosis not present

## 2015-04-17 DIAGNOSIS — R0600 Dyspnea, unspecified: Secondary | ICD-10-CM | POA: Diagnosis not present

## 2015-04-17 DIAGNOSIS — K7030 Alcoholic cirrhosis of liver without ascites: Secondary | ICD-10-CM | POA: Diagnosis not present

## 2015-04-17 DIAGNOSIS — IMO0002 Reserved for concepts with insufficient information to code with codable children: Secondary | ICD-10-CM

## 2015-04-17 DIAGNOSIS — K766 Portal hypertension: Secondary | ICD-10-CM

## 2015-04-17 LAB — COMPREHENSIVE METABOLIC PANEL
ALK PHOS: 134 U/L — AB (ref 38–126)
ALT: 24 U/L (ref 17–63)
ANION GAP: 8 (ref 5–15)
AST: 46 U/L — ABNORMAL HIGH (ref 15–41)
Albumin: 4 g/dL (ref 3.5–5.0)
BILIRUBIN TOTAL: 1.3 mg/dL — AB (ref 0.3–1.2)
BUN: 20 mg/dL (ref 6–20)
CHLORIDE: 106 mmol/L (ref 101–111)
CO2: 23 mmol/L (ref 22–32)
Calcium: 8.9 mg/dL (ref 8.9–10.3)
Creatinine, Ser: 1.53 mg/dL — ABNORMAL HIGH (ref 0.61–1.24)
GFR calc Af Amer: 53 mL/min — ABNORMAL LOW (ref >60)
GFR, EST NON AFRICAN AMERICAN: 46 mL/min — AB (ref >60)
Glucose, Bld: 98 mg/dL (ref 65–99)
POTASSIUM: 4 mmol/L (ref 3.5–5.1)
SODIUM: 137 mmol/L (ref 135–145)
Total Protein: 7.4 g/dL (ref 6.5–8.1)

## 2015-04-17 LAB — CBC WITH DIFFERENTIAL/PLATELET
BASOS ABS: 0 10*3/uL (ref 0.0–0.1)
Basophils Relative: 0 % (ref 0–1)
Eosinophils Absolute: 0 10*3/uL (ref 0.0–0.7)
Eosinophils Relative: 1 % (ref 0–5)
HCT: 41 % (ref 39.0–52.0)
Hemoglobin: 13.8 g/dL (ref 13.0–17.0)
LYMPHS PCT: 40 % (ref 12–46)
Lymphs Abs: 0.9 10*3/uL (ref 0.7–4.0)
MCH: 34.5 pg — ABNORMAL HIGH (ref 26.0–34.0)
MCHC: 33.7 g/dL (ref 30.0–36.0)
MCV: 102.5 fL — AB (ref 78.0–100.0)
Monocytes Absolute: 0.2 10*3/uL (ref 0.1–1.0)
Monocytes Relative: 10 % (ref 3–12)
NEUTROS PCT: 49 % (ref 43–77)
Neutro Abs: 1.2 10*3/uL — ABNORMAL LOW (ref 1.7–7.7)
PLATELETS: 55 10*3/uL — AB (ref 150–400)
RBC: 4 MIL/uL — AB (ref 4.22–5.81)
RDW: 14.2 % (ref 11.5–15.5)
WBC: 2.4 10*3/uL — AB (ref 4.0–10.5)

## 2015-04-17 LAB — BRAIN NATRIURETIC PEPTIDE: B Natriuretic Peptide: 324.5 pg/mL — ABNORMAL HIGH (ref 0.0–100.0)

## 2015-04-17 NOTE — Patient Instructions (Addendum)
Labs today  Will have [facility] try to increase your Uptravi to 400 mcg Twice daily   You have been referred to Hematology  You have been referred to Pulmonary, Dr Halford Chessman   We will contact you in 2 months to schedule your next appointment.

## 2015-04-17 NOTE — Telephone Encounter (Signed)
Called the patient back at number listed, no answer and mail box full. Will continue to try to contact the patient.

## 2015-04-17 NOTE — Telephone Encounter (Signed)
The patient called back to clarify he did not received any prescriptions referenced in the dermatology notes/OV. He would just like something sent in as face still broken out .  He states was just a bad experience with the dermatologist.

## 2015-04-17 NOTE — Progress Notes (Signed)
6 min walk completed, pt ambulated 1260 ft (384 M) O2 sat ranged from 89-97% HR ranged from 91-101

## 2015-04-17 NOTE — Telephone Encounter (Signed)
If Triamcinolone was helpful he can have a refill on that. If not have him try Metrogel apply to skin daily, disp #1 tube with 1 rf

## 2015-04-17 NOTE — Progress Notes (Signed)
Patient ID: Darren Mueller, male   DOB: [birth year], 64 y.o.   MRN: [id number] GI : Dr Ardis Hughs PCP: Dr Charlett Blake  Subjective:    Darren Mueller is a 64 yo with history of COPD, portopulmonary hypertension with RV failure, and ETOH cirrhosis presents for followup of his PAH.  [place] 2/14: RA = 13  RV = 63/5/14  PA = 65/24 (41)  PCW = 6  Hepatic wedge = 21  Fick cardiac output/index = 3.8/1.8  PVR = 9.1  FA sat = 94%  PA sat = 58%, 56%  SVC = 54%  PFTs 3/14 FEV1 2.02 L (58%) FVC 2.7L (54%) FEV1/FVC (89%) DLCO 53%  RHC 06/06/14 RA = 5  RV = 73/5/9  PA = 81/25 (46)  PCW = 7  Fick cardiac output/index = 6.0/2.8  PVR = 6.5 [place]  FA sat = 95%  PA sat = 71%, 67%  Unable to get hepatic wedge  Admitted in December 2014 with syncope in the setting of persistent diarrhea. C.diff and O&P negative.  Creatinine was up to 2.7 from 1.7 baseline. He was hydrated. Losartan and lasix stopped.. Event Monitor - NSR 10/26/13.   ECHO 12/14 EF 55-60% Peak PA pressure 35. Severe RV dysfunction  ECHO 7/15 EF 60% RV moderately to severely dilated. Moderate HK RVSP 31m HG  ECHO 6/16 EF 60% RV moderately to severely dilated. Severe HK RVSP ~70-75 mm HG. D-shaped septum  6 min walk 01/10/13, 1290 feet  6MW (01/10/14) = 1280 feet (380 m) 6MW (9/15) = 1350 feet (411 m) O2 sats ranged from 89-95% on room air, HR ranged from 100-129.  Follow-up:  Remains on Macitentan and Adcirca. Started on treprostinil (06/30/14) but unableto tolerate due to flushing, HAs and diarrhea.  Switched to selexipag in 2/16. Got up to 800 bid but then had to cut back due to side effects. Now back down to 200 bid. Tolerating that well. Going to Pulmonary Rehab. Continues with NYHA II-III symptoms. Now walking on TM at 2.0 mph with 4% grade for 15 minutes. Does not require O2 during that time. Not swelling. He has cut back on ETOH, drinks rarely.  Denies presyncope/syncope. Taking lasix M/W/F. Weight stable at 200-201.    Labs:  10/19/13  Creatinine 1.79 K 4.1  3/15         Cr 1.78 K 4.2 03/17/14    CR 1.23 K 4.0 LFTs ok 06/06/14 K 4.0 Creatinine 1.4  08/08/14 K 4.0 Cr 1.6 09/12/14 K 4.3 Cr 1.8 LFTs ok, HCT 58 12/15 K 4.3 Cr 1.15   Current Outpatient Prescriptions on File Prior to Encounter  Medication Sig Dispense Refill  . diphenoxylate-atropine (LOMOTIL) 2.5-0.025 MG per tablet Take 1 tablet by mouth 2 (two) times daily. 60 tablet 3  . doxepin (SINEQUAN) 10 MG capsule Take 1-2 capsules (10-20 mg total) by mouth at bedtime. 60 capsule 1  . Doxepin HCl 6 MG TABS Take 1 tablet (6 mg total) by mouth at bedtime. 30 tablet 1  . fluticasone-salmeterol (ADVAIR HFA) 230-21 MCG/ACT inhaler Inhale 2 puffs into the lungs 2 (two) times daily. 1 Inhaler 2  . furosemide (LASIX) 40 MG tablet Take one tablet every Monday, Wednesday, and Friday. 90 tablet 3  . levothyroxine (SYNTHROID, LEVOTHROID) 25 MCG tablet TAKE 1 TABLET DAILY 90 tablet 3  . loperamide (ANTI-DIARRHEAL) 2 MG capsule Take 4 mg by mouth daily.     .Marland Kitchenlosartan (COZAAR) 50 MG tablet Take 1 tablet (50 mg total) by mouth daily.  90 tablet 3  . Macitentan (OPSUMIT) 10 MG TABS Take 10 mg by mouth daily. 90 tablet 3  . Melatonin 3 MG CAPS Take 3 capsules (9 mg total) by mouth at bedtime.    . metroNIDAZOLE (METROGEL) 1 % gel Apply topically daily. 60 g 1  . mometasone (ELOCON) 0.1 % cream Apply 1 application topically daily. 50 g 1  . omeprazole (PRILOSEC) 40 MG capsule TAKE 1 CAPSULE DAILY 90 capsule 2  . potassium chloride (K-DUR) 10 MEQ tablet Take 1 tablet (10 mEq total) by mouth daily. (Patient taking differently: Take 20 mEq by mouth daily. 26mq daily and an extra 215m on Monday and Friday) 360 tablet 3  . Selexipag 200 MCG TABS Take 400 mcg by mouth 2 (two) times daily. (Patient taking differently: Take 200 mcg by mouth 2 (two) times daily. ) 60 tablet 0  . spironolactone (ALDACTONE) 50 MG tablet TAKE 1 TABLET DAILY 90 tablet 1  . SYMAX-SR 0.375 MG 12 hr tablet TAKE 1 TABLET  TWICE A DAY 180 tablet 1  . Tadalafil, PAH, (ADCIRCA) 20 MG TABS TAKE 2 TABLETS (40 MG TOTAL) DAILY 180 tablet 2  . triamcinolone cream (KENALOG) 0.1 % Apply 1 application topically daily as needed. Dermatitis, 90 day supply, 3 tubes 85.2 g 3  . verapamil (VERELAN PM) 180 MG 24 hr capsule Take 1 capsule (180 mg total) by mouth every morning. 90 capsule 1   No current facility-administered medications on file prior to encounter.    Objective:   Weight Range:  Vital Signs:   Pulse Rate:  [88] 88 (06/14 1141) BP: (120)/(68) 120/68 mmHg (06/14 1141) SpO2:  [95 %] 95 % (06/14 1141) Weight:  [209 lb 8 oz (95.029 kg)] 209 lb 8 oz (95.029 kg) (06/14 1141)    Weight change: Filed Weights   04/17/15 1141  Weight: 209 lb 8 oz (95.029 kg)   Physical Exam: General: NAD  Neck: JVP ~9 cm, no thyromegaly or thyroid nodule.  Lungs: Clear to auscultation bilaterally with normal respiratory effort.  CV: Nondisplaced PMI. Heart regular  2/6 TR P2 LLSB with right-sided S3. No carotid bruit. Normal pedal pulses.  Abdomen: Soft, nontender, no hepatosplenomegaly, distention.  Ext: warm, tr edema. No clubbing Skin: Intact without lesions or rashes.  Neurologic: Alert and oriented x 3.  Psych: Normal affect.  Extremities: No clubbing or cyanosis.  HEENT: Normal. Except for erythematous rash/flushing  Labs: Basic Metabolic Panel:  Recent Labs Lab 04/17/15 1245  NA 137  K 4.0  CL 106  CO2 23  GLUCOSE 98  BUN 20  CREATININE 1.53*  CALCIUM 8.9    Liver Function Tests:  Recent Labs Lab 04/17/15 1245  AST 46*  ALT 24  ALKPHOS 134*  BILITOT 1.3*  PROT 7.4  ALBUMIN 4.0   No results for input(s): LIPASE, AMYLASE in the last 168 hours. No results for input(s): AMMONIA in the last 168 hours.  CBC:  Recent Labs Lab 04/17/15 1245  WBC 2.4*  NEUTROABS 1.2*  HGB 13.8  HCT 41.0  MCV 102.5*  PLT 55*    Cardiac Enzymes: No results for input(s): CKTOTAL, CKMB, CKMBINDEX, TROPONINI  in the last 168 hours.  BNP: BNP (last 3 results)  Recent Labs  08/08/14 1115  PROBNP 1109.0*     Other results:  :   Imaging: No results found.    Assessment/Plan    1. PAH: Suspected portopulmonary HTN. -- Reports NYHA III type symptoms but performance in pulmonary rehab  suggest NYHA II  -- Now on macitentan 10 daily, adcirca 40 mg daily, selexipag but dose recently down-titrated due to side effects (now on 200 bid). Will attempt to increase back to 400 bid -- Echo reviewed personally in clinic and shows stable RVSP with persistent RV strain. -- Will repeat 6MW today. If markedly worsened will need to consider repeat RHC and consideration of IV prostanoids though I worry about his ability to tolerate - Volume status stable with lasix. Continue Mon-Wed-Fri and an extra 20 meq Kdur  Mon-Wed-Fri.  - I have encouraged him to get a pulse oximeter to follow O2 sats and use O2 as need to keep them >= 90% at all times -- Once again encouraged complete abstinence from ETOH 2. CKD: Check BMET/BNP today.  3. ETOH cirrhosis: counseled on need to stop ETOH completely as this is likely contributing to [facility]. 4. HTN: BP stable. Continue losartan. 5. OSA: Has mild OSA with AHI 12 and desats down to 82%. Awaiting f/u with Dr. Gwenette Greet. Given that Dr. Gwenette Greet is leaving will arrange f/u Dr. Halford Chessman 6. Pancytopenia: Progressive. Most likely due to cirrhosis. But meds and MDS also possibilities. Will send to Hematology.   Total time spent 35 minutes. Over half that time spent discussing above.    Glori Bickers, MD  04/18/2015  Addendum: 6MW = 384 meters (previously 441m, Relatively stable.   Darren Mueller, Daniel,MD 12:14 AM

## 2015-04-17 NOTE — Progress Notes (Signed)
Advanced Heart Failure Medication Review by a Pharmacist  Does the patient  feel that his/her medications are working for him/her?  yes  Has the patient been experiencing any side effects to the medications prescribed?  no  Does the patient measure his/her own blood pressure or blood glucose at home?  no   Does the patient have any problems obtaining medications due to transportation or finances?   no  Understanding of regimen: good Understanding of indications: good Potential of compliance: good    Pharmacist comments: Patient presents to HF clinic and medications were reviewed with a pharmacist. No medication-related side effects or medication discrepancies noted at this time.   Megan E. Supple, Pharm.D Clinical Pharmacy Resident Pager: [phone] 04/17/2015 11:52 AM

## 2015-04-17 NOTE — Progress Notes (Signed)
  Echocardiogram 2D Echocardiogram has been performed.  Darren Mueller 04/17/2015, 11:40 AM

## 2015-04-18 NOTE — Telephone Encounter (Signed)
Try Fluticasone 0.05% lotion apply to affected area daily. Disp # 1 bottle

## 2015-04-18 NOTE — Telephone Encounter (Signed)
Pt states he has tried Triamcinolone, Metrogel, and Mometasone and none of them have been effective.  He is requesting a new prescription at this time.  He does not want to go back to dermatologist.  Last OV: 03/02/15 with Dr. Charlett Blake.  Please advise.

## 2015-04-19 ENCOUNTER — Encounter (HOSPITAL_COMMUNITY): Payer: Medicare Other

## 2015-04-19 MED ORDER — FLUTICASONE PROPIONATE 0.05 % EX LOTN: TOPICAL_LOTION | CUTANEOUS | Status: DC

## 2015-04-19 NOTE — Telephone Encounter (Signed)
Sent in lotion to [place] and patient informed

## 2015-04-20 ENCOUNTER — Encounter: Payer: Self-pay | Admitting: Pulmonary Disease

## 2015-04-24 ENCOUNTER — Encounter (HOSPITAL_COMMUNITY)
Admission: RE | Admit: 2015-04-24 | Discharge: 2015-04-24 | Disposition: A | Discharge status: Home / Self Care | Payer: Medicare Other | Source: Ambulatory Visit | Attending: Internal Medicine | Admitting: Internal Medicine

## 2015-04-26 ENCOUNTER — Encounter (HOSPITAL_COMMUNITY): Payer: Medicare Other

## 2015-04-30 ENCOUNTER — Telehealth: Payer: Self-pay | Admitting: *Deleted

## 2015-04-30 NOTE — Telephone Encounter (Signed)
Pt signed ROI received via fax from [facility] of [facility]. Forwarded to [place] to scan/email to medical records. JG//CMA

## 2015-05-01 ENCOUNTER — Encounter (HOSPITAL_COMMUNITY)
Admission: RE | Admit: 2015-05-01 | Discharge: 2015-05-01 | Disposition: A | Discharge status: Home / Self Care | Payer: Medicare Other | Source: Ambulatory Visit | Attending: Internal Medicine | Admitting: Internal Medicine

## 2015-05-03 ENCOUNTER — Encounter (HOSPITAL_COMMUNITY)
Admission: RE | Admit: 2015-05-03 | Discharge: 2015-05-03 | Disposition: A | Discharge status: Home / Self Care | Payer: Medicare Other | Source: Ambulatory Visit | Attending: Internal Medicine | Admitting: Internal Medicine

## 2015-05-08 ENCOUNTER — Encounter (HOSPITAL_COMMUNITY): Payer: Medicare Other

## 2015-05-08 DIAGNOSIS — I272 Other secondary pulmonary hypertension: Secondary | ICD-10-CM | POA: Diagnosis not present

## 2015-05-08 DIAGNOSIS — Z5189 Encounter for other specified aftercare: Secondary | ICD-10-CM | POA: Diagnosis present

## 2015-05-08 DIAGNOSIS — Z87891 Personal history of nicotine dependence: Secondary | ICD-10-CM | POA: Insufficient documentation

## 2015-05-10 ENCOUNTER — Encounter (HOSPITAL_COMMUNITY)
Admission: RE | Admit: 2015-05-10 | Discharge: 2015-05-10 | Disposition: A | Discharge status: Home / Self Care | Payer: Medicare Other | Source: Ambulatory Visit | Attending: Internal Medicine | Admitting: Internal Medicine

## 2015-05-10 ENCOUNTER — Other Ambulatory Visit: Payer: Self-pay | Admitting: Family Medicine

## 2015-05-11 ENCOUNTER — Encounter (HOSPITAL_COMMUNITY): Payer: Self-pay

## 2015-05-11 NOTE — Progress Notes (Signed)
[facility] faxed medical record request for patient to start VA benefits. All records from our clinic and provider encounters on file for patient mailed to [place] to provided address... [place]   [address]  Copy of request scanned into electronic medical records.  Renee Pain

## 2015-05-15 ENCOUNTER — Other Ambulatory Visit: Payer: Self-pay | Admitting: Family Medicine

## 2015-05-15 ENCOUNTER — Encounter (HOSPITAL_COMMUNITY): Payer: Medicare Other

## 2015-05-15 MED ORDER — FLUTICASONE PROPIONATE 0.05 % EX LOTN: TOPICAL_LOTION | CUTANEOUS | Status: DC

## 2015-05-17 ENCOUNTER — Encounter (HOSPITAL_COMMUNITY)
Admission: RE | Admit: 2015-05-17 | Discharge: 2015-05-17 | Disposition: A | Discharge status: Home / Self Care | Payer: Medicare Other | Source: Ambulatory Visit | Attending: Internal Medicine | Admitting: Internal Medicine

## 2015-05-22 ENCOUNTER — Encounter (HOSPITAL_COMMUNITY): Payer: Medicare Other

## 2015-05-24 ENCOUNTER — Encounter (HOSPITAL_COMMUNITY)
Admission: RE | Admit: 2015-05-24 | Discharge: 2015-05-24 | Disposition: A | Discharge status: Home / Self Care | Payer: Medicare Other | Source: Ambulatory Visit | Attending: Internal Medicine | Admitting: Internal Medicine

## 2015-05-28 ENCOUNTER — Other Ambulatory Visit: Payer: Self-pay | Admitting: *Deleted

## 2015-05-29 ENCOUNTER — Encounter (HOSPITAL_COMMUNITY)
Admission: RE | Admit: 2015-05-29 | Discharge: 2015-05-29 | Disposition: A | Discharge status: Home / Self Care | Payer: Medicare Other | Source: Ambulatory Visit | Attending: Internal Medicine | Admitting: Internal Medicine

## 2015-05-31 ENCOUNTER — Encounter (HOSPITAL_COMMUNITY)
Admission: RE | Admit: 2015-05-31 | Discharge: 2015-05-31 | Disposition: A | Discharge status: Home / Self Care | Payer: Medicare Other | Source: Ambulatory Visit | Attending: Internal Medicine | Admitting: Internal Medicine

## 2015-06-01 ENCOUNTER — Encounter: Payer: Self-pay | Admitting: Family Medicine

## 2015-06-01 ENCOUNTER — Telehealth: Payer: Self-pay | Admitting: *Deleted

## 2015-06-01 ENCOUNTER — Ambulatory Visit (INDEPENDENT_AMBULATORY_CARE_PROVIDER_SITE_OTHER): Payer: Medicare Other | Admitting: Family Medicine

## 2015-06-01 VITALS — BP 128/66 | HR 81 | Temp 98.1°F | Resp 16 | Ht 72.0 in | Wt 213.2 lb

## 2015-06-01 DIAGNOSIS — N189 Chronic kidney disease, unspecified: Secondary | ICD-10-CM

## 2015-06-01 DIAGNOSIS — L309 Dermatitis, unspecified: Secondary | ICD-10-CM

## 2015-06-01 DIAGNOSIS — I2729 Other secondary pulmonary hypertension: Secondary | ICD-10-CM

## 2015-06-01 DIAGNOSIS — D61818 Other pancytopenia: Secondary | ICD-10-CM

## 2015-06-01 DIAGNOSIS — E038 Other specified hypothyroidism: Secondary | ICD-10-CM

## 2015-06-01 DIAGNOSIS — K219 Gastro-esophageal reflux disease without esophagitis: Secondary | ICD-10-CM

## 2015-06-01 DIAGNOSIS — E876 Hypokalemia: Secondary | ICD-10-CM

## 2015-06-01 DIAGNOSIS — N183 Chronic kidney disease, stage 3 unspecified: Secondary | ICD-10-CM

## 2015-06-01 DIAGNOSIS — I272 Other secondary pulmonary hypertension: Secondary | ICD-10-CM

## 2015-06-01 DIAGNOSIS — I10 Essential (primary) hypertension: Secondary | ICD-10-CM | POA: Diagnosis not present

## 2015-06-01 DIAGNOSIS — R197 Diarrhea, unspecified: Secondary | ICD-10-CM

## 2015-06-01 DIAGNOSIS — I50810 Right heart failure, unspecified: Secondary | ICD-10-CM

## 2015-06-01 DIAGNOSIS — I509 Heart failure, unspecified: Secondary | ICD-10-CM

## 2015-06-01 LAB — COMPREHENSIVE METABOLIC PANEL
ALK PHOS: 133 U/L — AB (ref 39–117)
ALT: 22 U/L (ref 0–53)
AST: 43 U/L — ABNORMAL HIGH (ref 0–37)
Albumin: 3.8 g/dL (ref 3.5–5.2)
BUN: 18 mg/dL (ref 6–23)
CO2: 22 mEq/L (ref 19–32)
CREATININE: 1.33 mg/dL (ref 0.40–1.50)
Calcium: 8.9 mg/dL (ref 8.4–10.5)
Chloride: 106 mEq/L (ref 96–112)
GFR: 69.33 mL/min (ref >60.00)
GLUCOSE: 100 mg/dL — AB (ref 70–99)
POTASSIUM: 3.9 meq/L (ref 3.5–5.1)
Sodium: 137 mEq/L (ref 135–145)
TOTAL PROTEIN: 7.1 g/dL (ref 6.0–8.3)
Total Bilirubin: 0.6 mg/dL (ref 0.2–1.2)

## 2015-06-01 LAB — CBC
HCT: 38.1 % — ABNORMAL LOW (ref 39.0–52.0)
HEMOGLOBIN: 12.7 g/dL — AB (ref 13.0–17.0)
MCHC: 33.3 g/dL (ref 30.0–36.0)
MCV: 103.5 fl — AB (ref 78.0–100.0)
RBC: 3.68 Mil/uL — ABNORMAL LOW (ref 4.22–5.81)
RDW: 14.2 % (ref 11.5–15.5)
WBC: 1.8 10*3/uL — CL (ref 4.0–10.5)

## 2015-06-01 LAB — LIPID PANEL
Cholesterol: 184 mg/dL (ref 0–200)
HDL: 59.6 mg/dL (ref >39.00)
NonHDL: 124.58
Total CHOL/HDL Ratio: 3
Triglycerides: 289 mg/dL — ABNORMAL HIGH (ref 0.0–149.0)
VLDL: 57.8 mg/dL — ABNORMAL HIGH (ref 0.0–40.0)

## 2015-06-01 LAB — LDL CHOLESTEROL, DIRECT: LDL DIRECT: 72 mg/dL

## 2015-06-01 LAB — TSH: TSH: 2.81 u[IU]/mL (ref 0.35–4.50)

## 2015-06-01 NOTE — Patient Instructions (Signed)

## 2015-06-01 NOTE — Telephone Encounter (Signed)
Platelet count 38 WBC 1.8  Notified Dr. Charlett Blake who stated that hematology referral was previously placed, labs have worsened since last visit.  Notified Jenn to please make referral stat.    Notified patient and he stated understanding.

## 2015-06-01 NOTE — Progress Notes (Signed)
Darren Mueller  [id number] Mar 15, 1951 06/01/2015      Progress Note-Follow Up  Subjective  Chief Complaint  Chief Complaint  Patient presents with  . Follow-up    no concerns     HPI  Patient is a 64 y.o. male in today for routine medical care. Patient is in today  A.His diarrhea is improved to roughly 21 episode daily as long as he eat well. No abdominal pain or fevers. His skin rash has improved with sterile cream. No new or acute illness. Continues to have shortness of br but he is stable at his baseline. Is following closely with cardiology. Denies CP/palp/HA/congestion/fevers/GI or GU c/o. Taking meds as prescribed  Past Medical History  Diagnosis Date  . Hypertension   . Seizures   . Hx of colonic polyps   . GERD (gastroesophageal reflux disease)   . Gout   . Alcoholic cirrhosis of liver with ascites 2014  . Unspecified hypothyroidism 01/25/2013  . Unspecified pleural effusion   . Other chronic pulmonary heart diseases   . Hypopotassemia   . Renal insufficiency 07/18/2013  . Diarrhea 09/04/2013  . Otalgia 11/28/2013    Past Surgical History  Procedure Laterality Date  . Colonoscopy N/A 12/08/2013    Procedure: COLONOSCOPY;  Surgeon: Milus Banister, MD;  Location: [facility];  Service: Endoscopy;  Laterality: N/A;  . Right heart catheterization Right 06/06/2014    Procedure: RIGHT HEART CATH;  Surgeon: Jolaine Artist, MD;  Location: [facility] CATH LAB;  Service: Cardiovascular;  Laterality: Right;  . Uvulopalatopharyngoplasty  1999    Family History  Problem Relation Age of Onset  . Heart disease Mother   . Prostate cancer Father   . Colon cancer Neg Hx   . Heart attack Mother   . Hypertension Mother     History   Social History  . Marital Status: Divorced    Spouse Name: N/A  . Number of Children: 3  . Years of Education: N/A   Occupational History  . Retired     Social research officer, government   Social History Main Topics  . Smoking status: Former Smoker -- 2.00  packs/day for 5 years    Types: Cigarettes    Quit date: 09/22/1979  . Smokeless tobacco: Never Used  . Alcohol Use: 0.0 oz/week    0 Standard drinks or equivalent per week     Comment: 2 vodka tonic mon, wed, friday  . Drug Use: No  . Sexual Activity: Not on file   Other Topics Concern  . Not on file   Social History Narrative   0 caffeine drinks daily     Current Outpatient Prescriptions on File Prior to Visit  Medication Sig Dispense Refill  . diphenoxylate-atropine (LOMOTIL) 2.5-0.025 MG per tablet Take 1 tablet by mouth 2 (two) times daily. 60 tablet 3  . doxepin (SINEQUAN) 10 MG capsule Take 1-2 capsules (10-20 mg total) by mouth at bedtime. 60 capsule 1  . Doxepin HCl 6 MG TABS Take 1 tablet (6 mg total) by mouth at bedtime. 30 tablet 1  . Fluticasone Propionate 0.05 % LOTN Apply daily to affected area 3 Bottle 3  . fluticasone-salmeterol (ADVAIR HFA) 230-21 MCG/ACT inhaler Inhale 2 puffs into the lungs 2 (two) times daily. 1 Inhaler 2  . furosemide (LASIX) 40 MG tablet Take one tablet every Monday, Wednesday, and Friday. 90 tablet 3  . levothyroxine (SYNTHROID, LEVOTHROID) 25 MCG tablet TAKE 1 TABLET DAILY 90 tablet 3  . loperamide (ANTI-DIARRHEAL) 2  MG capsule Take 4 mg by mouth daily.     Marland Kitchen losartan (COZAAR) 50 MG tablet Take 1 tablet (50 mg total) by mouth daily. 90 tablet 3  . Macitentan (OPSUMIT) 10 MG TABS Take 10 mg by mouth daily. 90 tablet 3  . Melatonin 3 MG CAPS Take 3 capsules (9 mg total) by mouth at bedtime.    . metroNIDAZOLE (METROGEL) 1 % gel Apply topically daily. 60 g 1  . mometasone (ELOCON) 0.1 % cream Apply 1 application topically daily. 50 g 1  . omeprazole (PRILOSEC) 40 MG capsule TAKE 1 CAPSULE DAILY 90 capsule 2  . potassium chloride (K-DUR) 10 MEQ tablet Take 1 tablet (10 mEq total) by mouth daily. (Patient taking differently: Take 20 mEq by mouth daily. 37mq daily and an extra 252m on Monday and Friday) 360 tablet 3  . Selexipag 200 MCG TABS  Take 400 mcg by mouth 2 (two) times daily. (Patient taking differently: Take 200 mcg by mouth 2 (two) times daily. ) 60 tablet 0  . spironolactone (ALDACTONE) 50 MG tablet TAKE 1 TABLET DAILY 90 tablet 1  . SYMAX-SR 0.375 MG 12 hr tablet TAKE 1 TABLET TWICE A DAY 180 tablet 1  . Tadalafil, PAH, (ADCIRCA) 20 MG TABS TAKE 2 TABLETS (40 MG TOTAL) DAILY 180 tablet 2  . triamcinolone cream (KENALOG) 0.1 % Apply 1 application topically daily as needed. Dermatitis, 90 day supply, 3 tubes 85.2 g 3  . verapamil (VERELAN PM) 180 MG 24 hr capsule Take 1 capsule (180 mg total) by mouth every morning. 90 capsule 1   No current facility-administered medications on file prior to visit.    Allergies  Allergen Reactions  . Aspirin Other (See Comments)    hypertension    Review of Systems  Review of Systems  Constitutional: Positive for malaise/fatigue. Negative for fever.  HENT: Negative for congestion.   Eyes: Negative for discharge.  Respiratory: Positive for shortness of breath.   Cardiovascular: Negative for chest pain, palpitations and leg swelling.  Gastrointestinal: Positive for diarrhea. Negative for nausea and abdominal pain.  Genitourinary: Negative for dysuria.  Musculoskeletal: Negative for falls.  Skin: Positive for itching and rash.  Neurological: Negative for loss of consciousness and headaches.  Endo/Heme/Allergies: Negative for polydipsia.  Psychiatric/Behavioral: Negative for depression and suicidal ideas. The patient is not nervous/anxious and does not have insomnia.     Objective  BP 128/66 mmHg  Pulse 81  Temp(Src) 98.1 F (36.7 C) (Oral)  Resp 16  Ht 6' (1.829 m)  Wt 213 lb 3.2 oz (96.707 kg)  BMI 28.91 kg/m2  SpO2 94%  Physical Exam  Physical Exam  Constitutional: He is oriented to person, place, and time and well-developed, well-nourished, and in no distress. No distress.  HENT:  Head: Normocephalic and atraumatic.  Eyes: Conjunctivae are normal.  Neck: Neck  supple. No thyromegaly present.  Cardiovascular: Normal rate, regular rhythm and normal heart sounds.   Pulmonary/Chest: Effort normal and breath sounds normal. No respiratory distress.  Abdominal: Soft. Bowel sounds are normal. He exhibits no distension and no mass. There is no tenderness.  Musculoskeletal: He exhibits no edema.  Neurological: He is alert and oriented to person, place, and time.  Skin: Skin is warm.  Psychiatric: Memory, affect and judgment normal.    Lab Results  Component Value Date   TSH 2.703 03/02/2015   Lab Results  Component Value Date   WBC 2.4* 04/17/2015   HGB 13.8 04/17/2015   HCT 41.0  04/17/2015   MCV 102.5* 04/17/2015   PLT 55* 04/17/2015   Lab Results  Component Value Date   CREATININE 1.53* 04/17/2015   BUN 20 04/17/2015   NA 137 04/17/2015   K 4.0 04/17/2015   CL 106 04/17/2015   CO2 23 04/17/2015   Lab Results  Component Value Date   ALT 24 04/17/2015   AST 46* 04/17/2015   ALKPHOS 134* 04/17/2015   BILITOT 1.3* 04/17/2015   Lab Results  Component Value Date   CHOL 178 03/02/2015   Lab Results  Component Value Date   HDL 71 03/02/2015   Lab Results  Component Value Date   LDLCALC 90 03/02/2015   Lab Results  Component Value Date   TRIG 83 03/02/2015   Lab Results  Component Value Date   CHOLHDL 2.5 03/02/2015     Assessment & Plan  Right heart failure due to pulmonary hypertension Is following closely with cardiology and is stable and doing fairly well  GERD (gastroesophageal reflux disease) Avoid offending foods, start probiotics. Do not eat large meals in late evening and consider raising head of bed.   Diarrhea Persists but is down to just in am unless he eats things he should  Not eat. Responds to Imodium.   Hypertension Well controlled, no changes to meds. Encouraged heart healthy diet such as the DASH diet and exercise as tolerated.   Hypothyroidism On Levothyroxine, continue to  monitor  Pancytopenia Worsening, is asymptomatic, has been referred to hematology. He will seek care if any bleeding occurs. Repeat CBC  Dermatitis Responding to steroid cream, will continue for now. May need referral to new dermatologist if worsens.   Chronic renal insufficiency, stage III (moderate) stable

## 2015-06-01 NOTE — Progress Notes (Signed)
Pre visit review using our clinic review tool, if applicable. No additional management support is needed unless otherwise documented below in the visit note.

## 2015-06-04 ENCOUNTER — Telehealth: Payer: Self-pay

## 2015-06-04 ENCOUNTER — Telehealth: Payer: Self-pay | Admitting: Hematology & Oncology

## 2015-06-04 NOTE — Telephone Encounter (Signed)
Lt mess regarding new patient appt being change and mailed out calendar

## 2015-06-04 NOTE — Telephone Encounter (Signed)
Darren Mueller called to confirm his new pt appt on 06/15/15 starting at 1pm

## 2015-06-05 ENCOUNTER — Encounter (HOSPITAL_COMMUNITY): Payer: Medicare Other

## 2015-06-05 DIAGNOSIS — Z87891 Personal history of nicotine dependence: Secondary | ICD-10-CM | POA: Diagnosis not present

## 2015-06-05 DIAGNOSIS — Z5189 Encounter for other specified aftercare: Secondary | ICD-10-CM | POA: Diagnosis not present

## 2015-06-05 DIAGNOSIS — I272 Other secondary pulmonary hypertension: Secondary | ICD-10-CM | POA: Diagnosis not present

## 2015-06-07 ENCOUNTER — Encounter (HOSPITAL_COMMUNITY): Payer: Medicare Other

## 2015-06-10 ENCOUNTER — Encounter: Payer: Self-pay | Admitting: Family Medicine

## 2015-06-10 DIAGNOSIS — D61818 Other pancytopenia: Secondary | ICD-10-CM | POA: Insufficient documentation

## 2015-06-10 DIAGNOSIS — L309 Dermatitis, unspecified: Secondary | ICD-10-CM | POA: Insufficient documentation

## 2015-06-10 NOTE — Assessment & Plan Note (Signed)
Persists but is down to just in am unless he eats things he should  Not eat. Responds to Imodium.

## 2015-06-10 NOTE — Assessment & Plan Note (Signed)
Avoid offending foods, start probiotics. Do not eat large meals in late evening and consider raising head of bed.  

## 2015-06-10 NOTE — Assessment & Plan Note (Signed)
Is following closely with cardiology and is stable and doing fairly well

## 2015-06-10 NOTE — Assessment & Plan Note (Signed)
stable

## 2015-06-10 NOTE — Assessment & Plan Note (Signed)
Responding to steroid cream, will continue for now. May need referral to new dermatologist if worsens.

## 2015-06-10 NOTE — Assessment & Plan Note (Signed)
Well controlled, no changes to meds. Encouraged heart healthy diet such as the DASH diet and exercise as tolerated.

## 2015-06-10 NOTE — Assessment & Plan Note (Signed)
Worsening, is asymptomatic, has been referred to hematology. He will seek care if any bleeding occurs. Repeat CBC

## 2015-06-10 NOTE — Assessment & Plan Note (Signed)
On Levothyroxine, continue to monitor 

## 2015-06-11 ENCOUNTER — Telehealth: Payer: Self-pay | Admitting: Family Medicine

## 2015-06-11 DIAGNOSIS — I10 Essential (primary) hypertension: Secondary | ICD-10-CM

## 2015-06-11 NOTE — Telephone Encounter (Signed)
Patient returning your call best [phone]

## 2015-06-11 NOTE — Telephone Encounter (Signed)
Patient informed and did schedule appt. At [facility] lab

## 2015-06-11 NOTE — Telephone Encounter (Signed)
Called left message to call back 

## 2015-06-11 NOTE — Telephone Encounter (Signed)
-----  Message from Mosie Lukes, MD sent at 06/10/2015 10:31 PM EDT ----- He has an appt with hematology but it is not until 8/12 see if he is willing to repeat a CBC in next day or so to monitor. He may want to do it at [facility].

## 2015-06-12 ENCOUNTER — Telehealth: Payer: Self-pay | Admitting: Lab

## 2015-06-12 ENCOUNTER — Other Ambulatory Visit (INDEPENDENT_AMBULATORY_CARE_PROVIDER_SITE_OTHER): Payer: Medicare Other

## 2015-06-12 ENCOUNTER — Encounter (HOSPITAL_COMMUNITY)
Admission: RE | Admit: 2015-06-12 | Discharge: 2015-06-12 | Disposition: A | Discharge status: Home / Self Care | Payer: Medicare Other | Source: Ambulatory Visit | Attending: Internal Medicine | Admitting: Internal Medicine

## 2015-06-12 DIAGNOSIS — Z23 Encounter for immunization: Secondary | ICD-10-CM

## 2015-06-12 DIAGNOSIS — M109 Gout, unspecified: Secondary | ICD-10-CM

## 2015-06-12 DIAGNOSIS — I10 Essential (primary) hypertension: Secondary | ICD-10-CM

## 2015-06-12 DIAGNOSIS — G4700 Insomnia, unspecified: Secondary | ICD-10-CM

## 2015-06-12 LAB — CBC WITH DIFFERENTIAL/PLATELET
BASOS ABS: 0 10*3/uL (ref 0.0–0.1)
BASOS PCT: 0.4 % (ref 0.0–3.0)
EOS PCT: 1.1 % (ref 0.0–5.0)
Eosinophils Absolute: 0 10*3/uL (ref 0.0–0.7)
HCT: 37.4 % — ABNORMAL LOW (ref 39.0–52.0)
HEMOGLOBIN: 12.5 g/dL — AB (ref 13.0–17.0)
Lymphocytes Relative: 33.3 % (ref 12.0–46.0)
Lymphs Abs: 0.7 10*3/uL (ref 0.7–4.0)
MCHC: 33.5 g/dL (ref 30.0–36.0)
MCV: 102.8 fl — ABNORMAL HIGH (ref 78.0–100.0)
MONOS PCT: 11.4 % (ref 3.0–12.0)
Monocytes Absolute: 0.2 10*3/uL (ref 0.1–1.0)
Neutro Abs: 1.1 10*3/uL — ABNORMAL LOW (ref 1.4–7.7)
Neutrophils Relative %: 53.8 % (ref 43.0–77.0)
Platelets: 37 10*3/uL — CL (ref 150.0–400.0)
RBC: 3.63 Mil/uL — AB (ref 4.22–5.81)
RDW: 14 % (ref 11.5–15.5)

## 2015-06-12 LAB — LIPID PANEL
CHOLESTEROL: 181 mg/dL (ref 0–200)
HDL: 67.5 mg/dL (ref >39.00)
LDL CALC: 99 mg/dL (ref 0–99)
NonHDL: 113.38
Total CHOL/HDL Ratio: 3
Triglycerides: 74 mg/dL (ref 0.0–149.0)
VLDL: 14.8 mg/dL (ref 0.0–40.0)

## 2015-06-12 LAB — URIC ACID: Uric Acid, Serum: 7.6 mg/dL (ref 4.0–7.8)

## 2015-06-12 LAB — HEPATIC FUNCTION PANEL
ALT: 29 U/L (ref 0–53)
AST: 67 U/L — ABNORMAL HIGH (ref 0–37)
Albumin: 3.9 g/dL (ref 3.5–5.2)
Alkaline Phosphatase: 140 U/L — ABNORMAL HIGH (ref 39–117)
BILIRUBIN DIRECT: 0.3 mg/dL (ref 0.0–0.3)
Total Bilirubin: 0.9 mg/dL (ref 0.2–1.2)
Total Protein: 7.2 g/dL (ref 6.0–8.3)

## 2015-06-12 LAB — RENAL FUNCTION PANEL
Albumin: 3.9 g/dL (ref 3.5–5.2)
BUN: 20 mg/dL (ref 6–23)
CALCIUM: 8.8 mg/dL (ref 8.4–10.5)
CO2: 26 meq/L (ref 19–32)
CREATININE: 1.38 mg/dL (ref 0.40–1.50)
Chloride: 107 mEq/L (ref 96–112)
GFR: 66.43 mL/min (ref >60.00)
Glucose, Bld: 110 mg/dL — ABNORMAL HIGH (ref 70–99)
PHOSPHORUS: 2.4 mg/dL (ref 2.3–4.6)
Potassium: 4 mEq/L (ref 3.5–5.1)
Sodium: 139 mEq/L (ref 135–145)

## 2015-06-12 LAB — TSH: TSH: 2.97 u[IU]/mL (ref 0.35–4.50)

## 2015-06-12 NOTE — Telephone Encounter (Signed)
Dr. Charlett Blake  Your patient Darren Mueller  Has a  Wbc 2.0 Platelet 37   Thank you  Vita Barley

## 2015-06-12 NOTE — Telephone Encounter (Signed)
Thanks, patient sees hematology this week.

## 2015-06-14 ENCOUNTER — Encounter (HOSPITAL_COMMUNITY): Payer: Medicare Other

## 2015-06-15 ENCOUNTER — Ambulatory Visit: Payer: Medicare Other

## 2015-06-15 ENCOUNTER — Ambulatory Visit (HOSPITAL_BASED_OUTPATIENT_CLINIC_OR_DEPARTMENT_OTHER): Payer: Medicare Other | Admitting: Hematology & Oncology

## 2015-06-15 ENCOUNTER — Encounter: Payer: Self-pay | Admitting: Hematology & Oncology

## 2015-06-15 ENCOUNTER — Other Ambulatory Visit (HOSPITAL_BASED_OUTPATIENT_CLINIC_OR_DEPARTMENT_OTHER): Payer: Medicare Other

## 2015-06-15 ENCOUNTER — Telehealth: Payer: Self-pay | Admitting: Hematology & Oncology

## 2015-06-15 VITALS — BP 127/80 | HR 82 | Temp 98.3°F | Resp 20 | Ht 72.0 in | Wt 209.0 lb

## 2015-06-15 DIAGNOSIS — R161 Splenomegaly, not elsewhere classified: Secondary | ICD-10-CM

## 2015-06-15 DIAGNOSIS — D619 Aplastic anemia, unspecified: Secondary | ICD-10-CM | POA: Diagnosis not present

## 2015-06-15 DIAGNOSIS — K7031 Alcoholic cirrhosis of liver with ascites: Secondary | ICD-10-CM | POA: Diagnosis not present

## 2015-06-15 DIAGNOSIS — D696 Thrombocytopenia, unspecified: Secondary | ICD-10-CM | POA: Diagnosis not present

## 2015-06-15 DIAGNOSIS — D72819 Decreased white blood cell count, unspecified: Secondary | ICD-10-CM

## 2015-06-15 DIAGNOSIS — D61818 Other pancytopenia: Secondary | ICD-10-CM | POA: Diagnosis not present

## 2015-06-15 LAB — CBC WITH DIFFERENTIAL (CANCER CENTER ONLY)
BASO#: 0 10*3/uL (ref 0.0–0.2)
BASO%: 0 % (ref 0.0–2.0)
EOS%: 0.6 % (ref 0.0–7.0)
Eosinophils Absolute: 0 10*3/uL (ref 0.0–0.5)
HCT: 39.5 % (ref 38.7–49.9)
HGB: 13.4 g/dL (ref 13.0–17.1)
LYMPH#: 0.7 10*3/uL — ABNORMAL LOW (ref 0.9–3.3)
LYMPH%: 42.1 % (ref 14.0–48.0)
MCH: 34.9 pg — AB (ref 28.0–33.4)
MCHC: 33.9 g/dL (ref 32.0–35.9)
MCV: 103 fL — AB (ref 82–98)
MONO#: 0.2 10*3/uL (ref 0.1–0.9)
MONO%: 10.4 % (ref 0.0–13.0)
NEUT#: 0.8 10*3/uL — ABNORMAL LOW (ref 1.5–6.5)
NEUT%: 46.9 % (ref 40.0–80.0)
Platelets: 40 10*3/uL — ABNORMAL LOW (ref 145–400)
RBC: 3.84 10*6/uL — ABNORMAL LOW (ref 4.20–5.70)
RDW: 13.1 % (ref 11.1–15.7)
WBC: 1.6 10*3/uL — ABNORMAL LOW (ref 4.0–10.0)

## 2015-06-15 LAB — PROTIME-INR (CHCC SATELLITE)
INR: 1.2 — ABNORMAL LOW (ref 2.0–3.5)
PROTIME: 14.4 s — AB (ref 10.6–13.4)

## 2015-06-15 LAB — CMP (CANCER CENTER ONLY)
ALT: 35 U/L (ref 10–47)
AST: 68 U/L — ABNORMAL HIGH (ref 11–38)
Albumin: 4 g/dL (ref 3.3–5.5)
Alkaline Phosphatase: 145 U/L — ABNORMAL HIGH (ref 26–84)
BILIRUBIN TOTAL: 1.4 mg/dL (ref 0.20–1.60)
BUN, Bld: 18 mg/dL (ref 7–22)
CHLORIDE: 106 meq/L (ref 98–108)
CO2: 17 mEq/L — ABNORMAL LOW (ref 18–33)
Calcium: 9.5 mg/dL (ref 8.0–10.3)
Creat: 1.5 mg/dl — ABNORMAL HIGH (ref 0.6–1.2)
Glucose, Bld: 100 mg/dL (ref 73–118)
Potassium: 3.6 mEq/L (ref 3.3–4.7)
Sodium: 140 mEq/L (ref 128–145)
TOTAL PROTEIN: 8.1 g/dL (ref 6.4–8.1)

## 2015-06-15 LAB — CHCC SATELLITE - SMEAR

## 2015-06-15 NOTE — Telephone Encounter (Signed)
Lt mess regarding calling to reschedule a future appt after no show

## 2015-06-15 NOTE — Progress Notes (Signed)
Referral MD  Reason for Referral: Leukopenia and thrombocytopenia. History of cirrhosis .   Chief Complaint  Patient presents with  . OTHER    New Patient  : My blood counts are low.  HPI: Darren Mueller is a very nice 64 year old African-American male. He is originally from [place]. He actually was in the [employer] for 25 years. 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  He has alcoholic cirrhosis. He does not drink. On done 2 years ago which showed changes consistent with cirrhosis.   He also has cardiac disease. He has been seen by cardiology. He had an echocardiogram done in June which showed an ejection fraction of 65-70%.  He has been noted to have leukopenia and thrombocytopenia. He's never had any problem with infections. He's had no bleeding. He's had no change in bowel or bladder habits. He's had no cough.  He used to smoke 3 packs a day for several years.   Going through the medical record, back in December 2012, a CBC was done which showed a white cell count of 4.6. Hemoglobin 14.9 and a platelet count of 82,000. MCV was 107.  In March 2014, a CBC was done which showed a white cell count of 3.6. Hemoglobin 13. And platelet count 114,000  In August 2015, his white cell count was 3.3. Hemoglobin 13.1. And platelet count of 67,000. MCV was 103..  In July of this year, his white Sickels 1.8. Hemoglobin 12.7. In clinic count was 38,000. His MCV was 104. He had a relatively normal white cell differential.  He has had his B-12 checked. It was 287 back in 2014. He has had iron studies done.  He has had no problem with weight loss or weight gain. He's had no nausea or vomiting. He's had no cough. He's had no headache. There's been no mouth sores. He's had no numbness or tingling in his hands or feet.  We were asked to see him to help with the evaluation of his leukopenia and thrombocytopenia.     Past Medical History  Diagnosis Date  . Hypertension   . Seizures   . Hx of  colonic polyps   . GERD (gastroesophageal reflux disease)   . Gout   . Alcoholic cirrhosis of liver with ascites 2014  . Unspecified hypothyroidism 01/25/2013  . Unspecified pleural effusion   . Other chronic pulmonary heart diseases   . Hypopotassemia   . Renal insufficiency 07/18/2013  . Diarrhea 09/04/2013  . Otalgia 11/28/2013  . Dermatitis 06/10/2015  . Alcohol dependence in remission 03/22/2011    In remission   :  Past Surgical History  Procedure Laterality Date  . Colonoscopy N/A 12/08/2013    Procedure: COLONOSCOPY;  Surgeon: Milus Banister, MD;  Location: [facility] ENDOSCOPY;  Service: Endoscopy;  Laterality: N/A;  . Right heart catheterization Right 06/06/2014    Procedure: RIGHT HEART CATH;  Surgeon: Jolaine Artist, MD;  Location: [facility] CATH LAB;  Service: Cardiovascular;  Laterality: Right;  . Uvulopalatopharyngoplasty  1999  :   Current outpatient prescriptions:  .  doxepin (SINEQUAN) 10 MG capsule, Take 1-2 capsules (10-20 mg total) by mouth at bedtime., Disp: 60 capsule, Rfl: 1 .  Fluticasone Propionate 0.05 % LOTN, Apply daily to affected area, Disp: 3 Bottle, Rfl: 3 .  fluticasone-salmeterol (ADVAIR HFA) 230-21 MCG/ACT inhaler, Inhale 2 puffs into the lungs 2 (two) times daily., Disp: 1 Inhaler, Rfl: 2 .  furosemide (LASIX) 40 MG tablet, Take one tablet every Monday, Wednesday, and Friday.,  Disp: 90 tablet, Rfl: 3 .  levothyroxine (SYNTHROID, LEVOTHROID) 25 MCG tablet, TAKE 1 TABLET DAILY, Disp: 90 tablet, Rfl: 3 .  loperamide (ANTI-DIARRHEAL) 2 MG capsule, Take 4 mg by mouth daily. , Disp: , Rfl:  .  losartan (COZAAR) 50 MG tablet, Take 1 tablet (50 mg total) by mouth daily., Disp: 90 tablet, Rfl: 3 .  Macitentan (OPSUMIT) 10 MG TABS, Take 10 mg by mouth daily., Disp: 90 tablet, Rfl: 3 .  Melatonin 3 MG CAPS, Take 3 capsules (9 mg total) by mouth at bedtime., Disp: , Rfl:  .  metroNIDAZOLE (METROGEL) 1 % gel, Apply topically daily., Disp: 60 g, Rfl: 1 .  omeprazole (PRILOSEC)  40 MG capsule, TAKE 1 CAPSULE DAILY, Disp: 90 capsule, Rfl: 2 .  potassium chloride (K-DUR) 10 MEQ tablet, Take 1 tablet (10 mEq total) by mouth daily. (Patient taking differently: Take 20 mEq by mouth daily. 72mq daily and an extra 223m on Monday and Friday), Disp: 360 tablet, Rfl: 3 .  Selexipag 200 MCG TABS, Take 400 mcg by mouth 2 (two) times daily. (Patient taking differently: Take 200 mcg by mouth 2 (two) times daily. ), Disp: 60 tablet, Rfl: 0 .  spironolactone (ALDACTONE) 50 MG tablet, TAKE 1 TABLET DAILY, Disp: 90 tablet, Rfl: 1 .  SYMAX-SR 0.375 MG 12 hr tablet, TAKE 1 TABLET TWICE A DAY, Disp: 180 tablet, Rfl: 1 .  Tadalafil, PAH, (ADCIRCA) 20 MG TABS, TAKE 2 TABLETS (40 MG TOTAL) DAILY, Disp: 180 tablet, Rfl: 2 .  triamcinolone cream (KENALOG) 0.1 %, Apply 1 application topically daily as needed. Dermatitis, 90 day supply, 3 tubes, Disp: 85.2 g, Rfl: 3 .  verapamil (VERELAN PM) 180 MG 24 hr capsule, Take 1 capsule (180 mg total) by mouth every morning., Disp: 90 capsule, Rfl: 1:  :  Allergies  Allergen Reactions  . Aspirin Other (See Comments)    hypertension  :  Family History  Problem Relation Age of Onset  . Heart disease Mother   . Prostate cancer Father   . Colon cancer Neg Hx   . Heart attack Mother   . Hypertension Mother   :  Social History   Social History  . Marital Status: Divorced    Spouse Name: N/A  . Number of Children: 3  . Years of Education: N/A   Occupational History  . Retired     [employer] research officer, [employer] Social History Main Topics  . Smoking status: Former Smoker -- 2.00 packs/day for 5 years    Types: Cigarettes    Quit date: 09/22/1979  . Smokeless tobacco: Never Used  . Alcohol Use: 0.0 oz/week    0 Standard drinks or equivalent per week     Comment: 2 vodka tonic mon, wed, friday  . Drug Use: No  . Sexual Activity: Not on file   Other Topics Concern  . Not on file   Social History Narrative   0 caffeine drinks daily   :  Pertinent  items are noted in HPI.  Exam: _0 @  well-developed and well-nourished African-American male. Vital signs show a temperature of 98.3. Pulse 82. Blood pressure 127/80. Weight is 209 pounds. Head and neck exam shows no ocular or oral lesions. He has no scleral icterus. He has no glossitis. There is no adenopathy on his neck. Thyroid is nonpalpable. Lungs are clear bilaterally. Cardiac exam regular rate and rhythm with no murmurs, rubs or bruits. Abdomen is soft. He has no fluid wave. There is no  palpable abdominal mass. There is no guarding or rebound tenderness. His spleen feels as if it is below the left costal margin. There is no hepatomegaly. Extremities shows some trace edema in his legs. Back exam shows no kyphosis or osteoporotic changes. Skin exam shows no rashes, ecchymosis or petechia. Neurological exam is nonfocal.    Recent Labs  06/15/15 1408  WBC 1.6*  HGB 13.4  HCT 39.5  PLT 40*    Recent Labs  06/15/15 1414  NA 140  K 3.6  CL 106  CO2 17*  GLUCOSE 100  BUN 18  CREATININE 1.5*  CALCIUM 9.5    Blood smear review:  Normochromic and normocytic population of red blood cells. There is no nucleated red blood cells. There are no teardrop cells. I see no target cells. He has no schistocytes or spherocytes. There might be a couple burr cells. White cells are decreased in number. White cells are mature. He has no hypersegmented polys. He has no blasts. There are no atypical lymphocytes. Platelets are decreased in number. He has several large platelets. Platelets are well granulated.   Pathology: None     Assessment and Plan:  Darren Mueller is a very nice 64 year old African-American male. He has leukopenia and thrombocytopenia.  I really think that cirrhosis is going to be the problem here. I suspect that he has functional hypersplenism and possibly even splenomegaly that is causing entrapment of the white cells and platelets. This is very common with cirrhosis and portal  hypertension.  I really don't think that he needs a bone marrow test. I'll think that we had seen anything on his blood smear or have found anything on his physical exam that would warrant doing a bone marrow test. I don't see anything that would suggest a hematologic malignancy or neoplasm.  I do think that an ultrasound of the abdomen would be worthwhile.  I spent about 45 minutes with him. I went over his lab work. He is very nice. We had a good time talking about our days in the [employer].  I want to see him back in 2 months. If everything looks stable in 2 months, then we can get him back in 4 months after that.  I answered all his questions. He left much more confident.

## 2015-06-18 ENCOUNTER — Ambulatory Visit (HOSPITAL_BASED_OUTPATIENT_CLINIC_OR_DEPARTMENT_OTHER)
Admission: RE | Admit: 2015-06-18 | Discharge: 2015-06-18 | Disposition: A | Discharge status: Home / Self Care | Payer: Medicare Other | Source: Ambulatory Visit | Attending: Hematology & Oncology | Admitting: Hematology & Oncology

## 2015-06-18 DIAGNOSIS — D61818 Other pancytopenia: Secondary | ICD-10-CM | POA: Insufficient documentation

## 2015-06-18 DIAGNOSIS — K7460 Unspecified cirrhosis of liver: Secondary | ICD-10-CM | POA: Diagnosis not present

## 2015-06-18 DIAGNOSIS — K828 Other specified diseases of gallbladder: Secondary | ICD-10-CM | POA: Diagnosis not present

## 2015-06-18 DIAGNOSIS — K7031 Alcoholic cirrhosis of liver with ascites: Secondary | ICD-10-CM

## 2015-06-18 DIAGNOSIS — R161 Splenomegaly, not elsewhere classified: Secondary | ICD-10-CM | POA: Diagnosis not present

## 2015-06-18 LAB — RETICULOCYTES (CHCC)
ABS RETIC: 50.3 10*3/uL (ref 19.0–186.0)
RBC.: 3.87 MIL/uL — ABNORMAL LOW (ref 4.22–5.81)
Retic Ct Pct: 1.3 % (ref 0.4–2.3)

## 2015-06-18 LAB — IRON AND TIBC
%SAT: 28 % (ref 20–55)
IRON: 84 ug/dL (ref 42–165)
TIBC: 304 ug/dL (ref 215–435)
UIBC: 220 ug/dL (ref 125–400)

## 2015-06-18 LAB — FERRITIN: FERRITIN: 199 ng/mL (ref 22–322)

## 2015-06-18 LAB — LACTATE DEHYDROGENASE: LDH: 230 U/L (ref 94–250)

## 2015-06-18 LAB — AFP TUMOR MARKER: AFP TUMOR MARKER: 2.3 ng/mL (ref <6.1)

## 2015-06-18 NOTE — Telephone Encounter (Signed)
Darren Mueller

## 2015-06-19 ENCOUNTER — Encounter (HOSPITAL_COMMUNITY): Payer: Medicare Other

## 2015-06-19 ENCOUNTER — Telehealth: Payer: Self-pay | Admitting: Emergency Medicine

## 2015-06-19 NOTE — Telephone Encounter (Signed)
-----  Message from Volanda Napoleon, MD sent at 06/18/2015 10:15 AM EDT ----- Please call and tell him that he ultrasound shows that he does have cirrhosis. Because of this, his spleen is getting bigger. This is exactly what I expect. Thanks

## 2015-06-19 NOTE — Telephone Encounter (Signed)
Spoke with patient; informed him of [place] results per Dr Antonieta Pert request. Advises patient to keep all future appointments. Mailing calendar to patient upon his request. Advised patient to call this office for any further questions or concerns in the meantime. Patient verbalized understanding.

## 2015-06-20 ENCOUNTER — Ambulatory Visit (INDEPENDENT_AMBULATORY_CARE_PROVIDER_SITE_OTHER): Payer: Medicare Other | Admitting: Pulmonary Disease

## 2015-06-20 ENCOUNTER — Encounter: Payer: Self-pay | Admitting: Pulmonary Disease

## 2015-06-20 VITALS — BP 124/74 | HR 73 | Temp 97.9°F | Ht 72.0 in | Wt 214.0 lb

## 2015-06-20 DIAGNOSIS — I272 Other secondary pulmonary hypertension: Secondary | ICD-10-CM | POA: Diagnosis not present

## 2015-06-20 DIAGNOSIS — IMO0002 Reserved for concepts with insufficient information to code with codable children: Secondary | ICD-10-CM

## 2015-06-20 DIAGNOSIS — J449 Chronic obstructive pulmonary disease, unspecified: Secondary | ICD-10-CM | POA: Diagnosis not present

## 2015-06-20 DIAGNOSIS — G4733 Obstructive sleep apnea (adult) (pediatric): Secondary | ICD-10-CM

## 2015-06-20 DIAGNOSIS — J4489 Other specified chronic obstructive pulmonary disease: Secondary | ICD-10-CM

## 2015-06-20 MED ORDER — ALBUTEROL SULFATE 108 (90 BASE) MCG/ACT IN AEPB: 2.0000 | INHALATION_SPRAY | Freq: Four times a day (QID) | RESPIRATORY_TRACT | Status: DC | PRN

## 2015-06-20 NOTE — Patient Instructions (Signed)
Will arrange for CPAP set up Proair respiclick every 4 to 6 hours as needed for cough, wheeze, or chest congestion  Follow up in 2 months after CPAP set up

## 2015-06-20 NOTE — Progress Notes (Signed)
Chief Complaint  Patient presents with  . Follow-up    Pt on 02 qhs.  Pt denies any complaints with his sleep.      History of Present Illness: Darren Mueller is a 64 y.o. male former smoker with OSA, COPD with chronic bronchitis, alcoholic cirrhosis, and secondary pulmonary hypertension.  He was previously seen by Dr. Gwenette Greet.  He still snores.  He was seen by cardiology and advised that Tx of his sleep apnea could help his pulmonary hypertension.  He was seen by hematology and felt that low blood counts were related to hypersplenism from cirrhosis of liver.  He uses advair bid.  He does not currently have rescue inhaler.  He gets cough with clear sputum.  He also feels wheeze and congestion in his chest.  This is worse when he goes in hot weather.  He denies chest pain or hemoptysis.  TESTS: PFT 12/27/12 >> FEV1 2.06 (59%), FEV1% 89, TLC 4.50 (61%), DLCO 53%, no BD V/Q scan 12/29/12 >> low probability PE HST 03/06/15 >> AHI 12, SaO2 low 82% Echo 04/17/15 >> EF 65 to 46%, grade 1 diastolic dysfx, mod AI, PAS 64 mmHg  Past medical hx >> HTN, CKD, GERD, Gout, Hypothyroidism, Seizures, Alcoholic cirrhosis, Leukopenia/Thrombocytopenia with splenomegaly  Past surgical hx, Medications, Allergies, Family hx, Social hx all reviewed.   Physical Exam: BP 124/74 mmHg  Pulse 73  Temp(Src) 97.9 F (36.6 C) (Oral)  Ht 6' (1.829 m)  Wt 214 lb (97.07 kg)  BMI 29.02 kg/m2  SpO2 96%  General - No distress ENT - No sinus tenderness, no oral exudate, no LAN Cardiac - s1s2 regular, no murmur Chest - b/l faint crackles clear with coughing Back - No focal tenderness Abd - Soft, non-tender Ext - No edema Neuro - Normal strength Skin - No rashes Psych - normal mood, and behavior   CMP Latest Ref Rng 06/15/2015 06/12/2015 06/01/2015  Glucose 73 - 118 mg/dL 100 110(H) 100(H)  BUN 7 - 22 mg/dL _0 Creatinine 0.6 - 1.2 mg/dl 1.5(H) 1.38 1.33  Sodium 128 - 145 mEq/L 140 139 137  Potassium 3.3 -  4.7 mEq/L 3.6 4.0 3.9  Chloride 98 - 108 mEq/L 106 107 106  CO2 18 - 33 mEq/L 17(L) 26 22  Calcium 8.0 - 10.3 mg/dL 9.5 8.8 8.9  Total Protein 6.4 - 8.1 g/dL 8.1 7.2 7.1  Albumin 3.3 - 5.5 g/dL 4.0 - -  Total Bilirubin 0.20 - 1.60 mg/dl 1.40 0.9 0.6  Alkaline Phos 26 - 84 U/L 145(H) 140(H) 133(H)  AST 11 - 38 U/L 68(H) 67(H) 43(H)  ALT 10 - 47 U/L 35 29 22    CBC Latest Ref Rng 06/15/2015 06/12/2015 06/01/2015  WBC 4.0 - 10.0 10e3/uL 1.6(L) 2.0 Repeated and verified X2.(L) 1.8 Repeated and verified X2.(LL)  Hemoglobin 13.0 - 17.1 g/dL 13.4 12.5(L) 12.7(L)  Hematocrit 38.7 - 49.9 % 39.5 37.4(L) 38.1(L)  Platelets 145 - 400 10e3/uL 40(L) 37.0 Repeated and verified X2.(LL) 38.0 Repeated and verified X2.(LL)       Assessment/Plan:  Obstructive sleep apnea. Plan: - will try auto CPAP first - if this doesn't work, then consider oral appliance or nocturnal oxygen - will arrange for ONO with CPAP  COPD with chronic bronchitis. Plan: - continue advair - add prn proair - re-assess at next visit  Pulmonary hypertension in setting of OSA, COPD, Alcoholic cirrhosis. Plan: - he is on macitentan, adcirca, and selexipag per cardiology  Hypersplenism. Plan:  -  he is followed by Dr. Marin Olp with hematology    Chesley Mires, MD [place] Pager:  [phone]

## 2015-06-21 ENCOUNTER — Encounter (HOSPITAL_COMMUNITY)
Admission: RE | Admit: 2015-06-21 | Discharge: 2015-06-21 | Disposition: A | Discharge status: Home / Self Care | Payer: Medicare Other | Source: Ambulatory Visit | Attending: Internal Medicine | Admitting: Internal Medicine

## 2015-06-26 ENCOUNTER — Encounter (HOSPITAL_COMMUNITY)
Admission: RE | Admit: 2015-06-26 | Discharge: 2015-06-26 | Disposition: A | Discharge status: Home / Self Care | Payer: Medicare Other | Source: Ambulatory Visit | Attending: Internal Medicine | Admitting: Internal Medicine

## 2015-06-28 ENCOUNTER — Ambulatory Visit

## 2015-06-28 ENCOUNTER — Encounter (HOSPITAL_COMMUNITY): Payer: Medicare Other

## 2015-06-28 ENCOUNTER — Other Ambulatory Visit

## 2015-06-28 ENCOUNTER — Ambulatory Visit: Payer: Medicare Other | Admitting: Hematology & Oncology

## 2015-06-30 ENCOUNTER — Other Ambulatory Visit: Payer: Self-pay | Admitting: Family Medicine

## 2015-06-30 NOTE — Telephone Encounter (Signed)
Please check with patient and see if he needs this refilled.

## 2015-07-02 NOTE — Telephone Encounter (Signed)
Called left message to call back 

## 2015-07-02 NOTE — Telephone Encounter (Signed)
Patient returning your call

## 2015-07-02 NOTE — Telephone Encounter (Signed)
Patient called back and he does want the refill.  He stated PCP filled last back in April

## 2015-07-03 ENCOUNTER — Encounter (HOSPITAL_COMMUNITY): Payer: Medicare Other

## 2015-07-05 ENCOUNTER — Encounter (HOSPITAL_COMMUNITY)
Admission: RE | Admit: 2015-07-05 | Discharge: 2015-07-05 | Disposition: A | Discharge status: Home / Self Care | Payer: Medicare Other | Source: Ambulatory Visit | Attending: Internal Medicine | Admitting: Internal Medicine

## 2015-07-05 DIAGNOSIS — Z87891 Personal history of nicotine dependence: Secondary | ICD-10-CM | POA: Insufficient documentation

## 2015-07-05 DIAGNOSIS — Z5189 Encounter for other specified aftercare: Secondary | ICD-10-CM | POA: Diagnosis not present

## 2015-07-05 DIAGNOSIS — I272 Other secondary pulmonary hypertension: Secondary | ICD-10-CM | POA: Insufficient documentation

## 2015-07-10 ENCOUNTER — Encounter (HOSPITAL_COMMUNITY): Payer: Medicare Other

## 2015-07-10 ENCOUNTER — Other Ambulatory Visit: Payer: Self-pay | Admitting: Family Medicine

## 2015-07-12 ENCOUNTER — Encounter (HOSPITAL_COMMUNITY)
Admission: RE | Admit: 2015-07-12 | Discharge: 2015-07-12 | Disposition: A | Discharge status: Home / Self Care | Payer: Medicare Other | Source: Ambulatory Visit | Attending: Internal Medicine | Admitting: Internal Medicine

## 2015-07-17 ENCOUNTER — Other Ambulatory Visit (HOSPITAL_COMMUNITY): Payer: Self-pay | Admitting: *Deleted

## 2015-07-17 ENCOUNTER — Other Ambulatory Visit: Payer: Self-pay | Admitting: Family Medicine

## 2015-07-17 ENCOUNTER — Encounter (HOSPITAL_COMMUNITY): Payer: Medicare Other

## 2015-07-17 MED ORDER — SELEXIPAG 800 MCG PO TABS: 800.0000 ug | ORAL_TABLET | Freq: Two times a day (BID) | ORAL | Status: DC

## 2015-07-17 NOTE — Telephone Encounter (Signed)
Caller name: Arhaan Chesnut   Relationship to patient: Self  Can be reached: [phone]  Pharmacy:[facility], [place]  Reason for call: pt request a refill on his doxepin  Rx.

## 2015-07-18 ENCOUNTER — Other Ambulatory Visit (HOSPITAL_COMMUNITY): Payer: Self-pay | Admitting: *Deleted

## 2015-07-18 MED ORDER — SELEXIPAG 800 MCG PO TABS: 800.0000 ug | ORAL_TABLET | Freq: Two times a day (BID) | ORAL | Status: DC

## 2015-07-18 NOTE — Telephone Encounter (Signed)
Pt just had rx filled 07/10/15 attempted to call pt to see if there are any issues but no answer. LMOVM

## 2015-07-19 ENCOUNTER — Encounter (HOSPITAL_COMMUNITY)
Admission: RE | Admit: 2015-07-19 | Discharge: 2015-07-19 | Disposition: A | Discharge status: Home / Self Care | Payer: Medicare Other | Source: Ambulatory Visit | Attending: Internal Medicine | Admitting: Internal Medicine

## 2015-07-19 NOTE — Telephone Encounter (Signed)
Pt is returning your call

## 2015-07-19 NOTE — Telephone Encounter (Signed)
Spoke with pt states he's received his meds.

## 2015-07-19 NOTE — Telephone Encounter (Signed)
LMOVM

## 2015-07-19 NOTE — Telephone Encounter (Signed)
Patient returning your call

## 2015-07-24 ENCOUNTER — Encounter (HOSPITAL_COMMUNITY): Payer: Medicare Other

## 2015-07-26 ENCOUNTER — Encounter (HOSPITAL_COMMUNITY): Payer: Medicare Other

## 2015-07-29 DIAGNOSIS — J449 Chronic obstructive pulmonary disease, unspecified: Secondary | ICD-10-CM | POA: Diagnosis not present

## 2015-08-02 ENCOUNTER — Telehealth: Payer: Self-pay | Admitting: Pulmonary Disease

## 2015-08-02 ENCOUNTER — Encounter (HOSPITAL_COMMUNITY)
Admission: RE | Admit: 2015-08-02 | Discharge: 2015-08-02 | Disposition: A | Discharge status: Home / Self Care | Payer: Medicare Other | Source: Ambulatory Visit | Attending: Internal Medicine | Admitting: Internal Medicine

## 2015-08-02 NOTE — Telephone Encounter (Signed)
ONO with CPAP and RA 07/29/15 >> Test time 3 hrs 31 min.  Mean SpO2 93.3%, low SpO2 82%.  Spetn 4 min 40 sec with SpO2 < 88%.  Will have my nurse inform pt that ONO showed good control of oxygen at night with CPAP.

## 2015-08-02 NOTE — Telephone Encounter (Signed)
lmtcb.

## 2015-08-07 NOTE — Telephone Encounter (Signed)
I spoke with patient about results and he verbalized understanding and had no questions 

## 2015-08-08 ENCOUNTER — Encounter (HOSPITAL_COMMUNITY): Payer: Self-pay | Admitting: Internal Medicine

## 2015-08-08 ENCOUNTER — Ambulatory Visit (HOSPITAL_COMMUNITY)
Admission: RE | Admit: 2015-08-08 | Discharge: 2015-08-08 | Disposition: A | Discharge status: Home / Self Care | Payer: Medicare Other | Source: Ambulatory Visit | Attending: Internal Medicine | Admitting: Internal Medicine

## 2015-08-08 VITALS — BP 112/66 | HR 90 | Wt 208.5 lb

## 2015-08-08 DIAGNOSIS — I272 Other secondary pulmonary hypertension: Secondary | ICD-10-CM | POA: Insufficient documentation

## 2015-08-08 DIAGNOSIS — Z79899 Other long term (current) drug therapy: Secondary | ICD-10-CM | POA: Diagnosis not present

## 2015-08-08 DIAGNOSIS — J449 Chronic obstructive pulmonary disease, unspecified: Secondary | ICD-10-CM | POA: Diagnosis not present

## 2015-08-08 DIAGNOSIS — K7030 Alcoholic cirrhosis of liver without ascites: Secondary | ICD-10-CM | POA: Diagnosis not present

## 2015-08-08 DIAGNOSIS — G4733 Obstructive sleep apnea (adult) (pediatric): Secondary | ICD-10-CM | POA: Insufficient documentation

## 2015-08-08 DIAGNOSIS — K766 Portal hypertension: Secondary | ICD-10-CM | POA: Diagnosis not present

## 2015-08-08 DIAGNOSIS — I129 Hypertensive chronic kidney disease with stage 1 through stage 4 chronic kidney disease, or unspecified chronic kidney disease: Secondary | ICD-10-CM | POA: Insufficient documentation

## 2015-08-08 DIAGNOSIS — I2721 Secondary pulmonary arterial hypertension: Secondary | ICD-10-CM

## 2015-08-08 DIAGNOSIS — N189 Chronic kidney disease, unspecified: Secondary | ICD-10-CM | POA: Diagnosis not present

## 2015-08-08 DIAGNOSIS — D61818 Other pancytopenia: Secondary | ICD-10-CM | POA: Insufficient documentation

## 2015-08-08 MED ORDER — SELEXIPAG 1000 MCG PO TABS: 1000.0000 ug | ORAL_TABLET | Freq: Two times a day (BID) | ORAL | Status: DC

## 2015-08-08 NOTE — Addendum Note (Signed)
Encounter addended by: Patton Salles, RN on: 08/08/2015 12:53 PM<BR>     Documentation filed: Patient Instructions Section

## 2015-08-08 NOTE — Addendum Note (Signed)
Encounter addended by: Patton Salles, RN on: 08/08/2015  4:10 PM<BR>     Documentation filed: Orders

## 2015-08-08 NOTE — Patient Instructions (Signed)
Follow up in 2-3 months  Do the following things EVERYDAY: 1. Weigh yourself in the morning before breakfast. Write it down and keep it in a log. 2. Take your medicines as prescribed 3. Eat low salt foods-Limit salt (sodium) to 2000 mg per day.  4. Stay as active as you can everyday 5. Limit all fluids for the day to less than 2 liters

## 2015-08-08 NOTE — Progress Notes (Signed)
Advanced Heart Failure Medication Review by a Pharmacist  Does the patient  feel that his/her medications are working for him/her?  yes  Has the patient been experiencing any side effects to the medications prescribed?  no  Does the patient measure his/her own blood pressure or blood glucose at home?  yes   Does the patient have any problems obtaining medications due to transportation or finances?   no  Understanding of regimen: good Understanding of indications: good Potential of compliance: good Patient understands to avoid NSAIDs. Patient understands to avoid decongestants.  Issues to address at subsequent visits: None   Pharmacist comments:  Darren Mueller is a pleasant 64 yo M presenting with his medication bottles. He reports excellent compliance with all of his medications and did not have any specific medication-related questions or concerns for me at this time.   [name]. [name], PharmD, BCPS, CPP Clinical Pharmacist Pager: [phone] Phone: [phone] 08/08/2015 12:30 PM    Time with patient: 8 minutes Preparation and documentation time: 2 minutes Total time: 10 minutes

## 2015-08-08 NOTE — Progress Notes (Signed)
ADVANCED HF CLINIC NOTE  Patient ID: Darren Mueller, male   DOB: 1951/09/15, 64 y.o.   MRN: [id number] GI : Dr Darren Mueller PCP: Dr Darren Mueller  Subjective:    Darren Mueller ("the Darren Mueller") is a 64 yo with history of COPD, portopulmonary hypertension with RV failure, and ETOH cirrhosis presents for followup of his PAH.  [place] 2/14: RA = 13  RV = 63/5/14  PA = 65/24 (41)  PCW = 6  Hepatic wedge = 21  Fick cardiac output/index = 3.8/1.8  PVR = 9.1  FA sat = 94%  PA sat = 58%, 56%  SVC = 54%  PFTs 3/14 FEV1 2.02 L (58%) FVC 2.7L (54%) FEV1/FVC (89%) DLCO 53%  RHC 06/06/14 RA = 5  RV = 73/5/9  PA = 81/25 (46)  PCW = 7  Fick cardiac output/index = 6.0/2.8  PVR = 6.5 [place]  FA sat = 95%  PA sat = 71%, 67%  Unable to get hepatic wedge  Admitted in December 2014 with syncope in the setting of persistent diarrhea. C.diff and O&P negative.  Creatinine was up to 2.7 from 1.7 baseline. He was hydrated. Losartan and lasix stopped.. Event Monitor - NSR 10/26/13.   ECHO 12/14 EF 55-60% Peak PA pressure 35. Severe RV dysfunction  ECHO 7/15 EF 60% RV moderately to severely dilated. Moderate HK RVSP 59m HG  ECHO 6/16 EF 60% RV moderately to severely dilated. Severe HK RVSP ~65 mm HG. D-shaped septum  Ab u/s 8/16: + cirrhosis/mild to moderate splenomegaly  6 min walk 01/10/13, 1290 feet  6MW (01/10/14) = 1280 feet (380 m) 6MW (9/15) = 1350 feet (411 m) O2 sats ranged from 89-95% on room air, HR ranged from 100-129. 6MW (6/16) = 384 meters    Follow-up:  Remains on Macitentan and Adcirca. Unable to toelrate treprostinil (06/30/14) bdue to flushing, HAs and diarrhea.  Switched to selexipag in 2/16. Got up to 800 bid but then had to cut back due to side effects. Then back down to 200 bid. Now back to 800 bid. Tolerating that well. Still with some flushing and occasional jaw claudication. Feels like it really has made a difference.  Got dismissed from Pulmonary Rehab as he missed meetings due to outside  obligations. Continues with NYHA II-III symptoms. Not exercising any more. Not swelling. He has cut back on ETOH, drinks rarely.  Denies presyncope/syncope. Taking lasix M/W/F. Weight down to 196 at home   Labs:  10/19/13 Creatinine 1.79 K 4.1  3/15         Cr 1.78 K 4.2 03/17/14    CR 1.23 K 4.0 LFTs ok 06/06/14 K 4.0 Creatinine 1.4  08/08/14 K 4.0 Cr 1.6 09/12/14 K 4.3 Cr 1.8 LFTs ok, HCT 58 12/15 K 4.3 Cr 1.15   Current Outpatient Prescriptions on File Prior to Encounter  Medication Sig Dispense Refill  . doxepin (SINEQUAN) 10 MG capsule TAKE 1 TO 2 CAPSULES (10 TO 20 MG TOTAL) AT BEDTIME 60 capsule 0  . Fluticasone Propionate 0.05 % LOTN Apply daily to affected area 3 Bottle 3  . fluticasone-salmeterol (ADVAIR HFA) 230-21 MCG/ACT inhaler Inhale 2 puffs into the lungs 2 (two) times daily. 1 Inhaler 2  . furosemide (LASIX) 40 MG tablet Take one tablet every Monday, Wednesday, and Friday. 90 tablet 3  . levothyroxine (SYNTHROID, LEVOTHROID) 25 MCG tablet TAKE 1 TABLET DAILY 90 tablet 3  . loperamide (ANTI-DIARRHEAL) 2 MG capsule Take 4 mg by mouth daily.     .Marland Kitchen  losartan (COZAAR) 50 MG tablet Take 1 tablet (50 mg total) by mouth daily. 90 tablet 3  . Macitentan (OPSUMIT) 10 MG TABS Take 10 mg by mouth daily. 90 tablet 3  . omeprazole (PRILOSEC) 40 MG capsule TAKE 1 CAPSULE DAILY 90 capsule 2  . potassium chloride (K-DUR) 10 MEQ tablet Take 1 tablet (10 mEq total) by mouth daily. 360 tablet 3  . Selexipag 800 MCG TABS Take 800 mcg by mouth 2 (two) times daily. 60 tablet 1  . spironolactone (ALDACTONE) 50 MG tablet TAKE 1 TABLET DAILY 90 tablet 1  . SYMAX-SR 0.375 MG 12 hr tablet TAKE 1 TABLET TWICE A DAY 180 tablet 1  . Tadalafil, PAH, (ADCIRCA) 20 MG TABS TAKE 2 TABLETS (40 MG TOTAL) DAILY 180 tablet 2  . verapamil (VERELAN PM) 180 MG 24 hr capsule Take 1 capsule (180 mg total) by mouth every morning. 90 capsule 1   No current facility-administered medications on file prior to encounter.     Objective:   Weight Range:  Vital Signs:   Pulse Rate:  [90] 90 (10/05 1214) BP: (112)/(66) 112/66 mmHg (10/05 1214) SpO2:  [98 %] 98 % (10/05 1214) Weight:  [208 lb 8 oz (94.575 kg)] 208 lb 8 oz (94.575 kg) (10/05 1214)    Weight change: Filed Weights   08/08/15 1214  Weight: 208 lb 8 oz (94.575 kg)   Physical Exam: General: NAD Flushed  Neck: JVP ~7-8 cm, no thyromegaly or thyroid nodule.  Lungs: Clear to auscultation bilaterally with normal respiratory effort.  CV: Nondisplaced PMI. Heart regular  2/6 TR P2 LLSB. No carotid bruit. Normal pedal pulses.  Abdomen: Soft, nontender, no hepatosplenomegaly, distention.  Ext: warm, tr edema. No clubbing Skin: Intact without lesions or rashes.  Neurologic: Alert and oriented x 3.  Psych: Normal affect.  Extremities: No clubbing or cyanosis.  HEENT: Normal. Except for erythematous rash/flushing   Assessment/Plan    1. PAH: Suspected portopulmonary HTN. -- NYHA II-III on triple therapy  -- Now on macitentan 10 daily, adcirca 40 mg daily, selexipag 800 bid. Will attempt to titrate to 1200 bid  but dose recently down-titrated due to side effects (now on 200 bid). Will attempt to increase back to 400 bid - Volume status stable with lasix. Continue Mon-Wed-Fri and an extra 20 meq Kdur  Mon-Wed-Fri.  - I have encouraged him to get a pulse oximeter to follow O2 sats and use O2 as need to keep them >= 90% at all times -- Once again encouraged complete abstinence from ETOH as this is critical 2. CKD: Check BMET/BNP today.  3. ETOH cirrhosis: counseled on need to stop ETOH completely as this is likely contributing to [facility]. 4. HTN: BP stable. Continue losartan. 5. OSA: Has mild OSA with AHI 12 and desats down to 82%. He was unable to tolerate CPAP.   6. Pancytopenia: Progressive. Most likely due to cirrhosis. Has seen Dr. Marin Mueller in Hematology.     Darren Bickers, MD  08/08/2015

## 2015-08-09 ENCOUNTER — Encounter (HOSPITAL_COMMUNITY): Payer: Self-pay

## 2015-08-09 DIAGNOSIS — Z5189 Encounter for other specified aftercare: Secondary | ICD-10-CM | POA: Insufficient documentation

## 2015-08-09 DIAGNOSIS — I272 Other secondary pulmonary hypertension: Secondary | ICD-10-CM | POA: Insufficient documentation

## 2015-08-09 DIAGNOSIS — Z87891 Personal history of nicotine dependence: Secondary | ICD-10-CM | POA: Insufficient documentation

## 2015-08-14 ENCOUNTER — Encounter (HOSPITAL_COMMUNITY): Payer: Self-pay

## 2015-08-15 ENCOUNTER — Ambulatory Visit: Payer: Medicare Other | Admitting: Hematology & Oncology

## 2015-08-15 ENCOUNTER — Other Ambulatory Visit: Payer: Medicare Other

## 2015-08-16 ENCOUNTER — Encounter (HOSPITAL_COMMUNITY): Admission: RE | Admit: 2015-08-16 | Payer: Self-pay | Source: Ambulatory Visit

## 2015-08-21 ENCOUNTER — Encounter (HOSPITAL_COMMUNITY): Payer: Self-pay

## 2015-08-23 ENCOUNTER — Encounter (HOSPITAL_COMMUNITY)
Admission: RE | Admit: 2015-08-23 | Discharge: 2015-08-23 | Disposition: A | Discharge status: Home / Self Care | Payer: Self-pay | Source: Ambulatory Visit | Attending: Internal Medicine | Admitting: Internal Medicine

## 2015-08-23 ENCOUNTER — Telehealth: Payer: Self-pay | Admitting: Family Medicine

## 2015-08-23 MED ORDER — DOXEPIN HCL 10 MG PO CAPS: ORAL_CAPSULE | ORAL | Status: DC

## 2015-08-23 NOTE — Telephone Encounter (Signed)
Prescription sent in  

## 2015-08-23 NOTE — Telephone Encounter (Signed)
Pharmacy: [facility]  Reason for call: Pt called for refill on doxepin. He said [facility] sent a request already but they told him to call us. Pt said he is taking 2 every day. Please send 90 day supply of 180 pills for him.

## 2015-08-27 ENCOUNTER — Encounter: Payer: Self-pay | Admitting: Pulmonary Disease

## 2015-08-27 ENCOUNTER — Ambulatory Visit (INDEPENDENT_AMBULATORY_CARE_PROVIDER_SITE_OTHER): Payer: Medicare Other | Admitting: Pulmonary Disease

## 2015-08-27 ENCOUNTER — Ambulatory Visit (INDEPENDENT_AMBULATORY_CARE_PROVIDER_SITE_OTHER)
Admission: RE | Admit: 2015-08-27 | Discharge: 2015-08-27 | Disposition: A | Discharge status: Home / Self Care | Payer: Medicare Other | Source: Ambulatory Visit | Attending: Pulmonary Disease | Admitting: Pulmonary Disease

## 2015-08-27 VITALS — BP 108/62 | HR 84 | Ht 72.0 in | Wt 206.8 lb

## 2015-08-27 DIAGNOSIS — I272 Other secondary pulmonary hypertension: Secondary | ICD-10-CM

## 2015-08-27 DIAGNOSIS — Z9989 Dependence on other enabling machines and devices: Principal | ICD-10-CM

## 2015-08-27 DIAGNOSIS — G4733 Obstructive sleep apnea (adult) (pediatric): Secondary | ICD-10-CM | POA: Diagnosis not present

## 2015-08-27 DIAGNOSIS — R0600 Dyspnea, unspecified: Secondary | ICD-10-CM | POA: Diagnosis not present

## 2015-08-27 DIAGNOSIS — IMO0002 Reserved for concepts with insufficient information to code with codable children: Secondary | ICD-10-CM

## 2015-08-27 DIAGNOSIS — J449 Chronic obstructive pulmonary disease, unspecified: Secondary | ICD-10-CM | POA: Diagnosis not present

## 2015-08-27 MED ORDER — FLUTICASONE-SALMETEROL 230-21 MCG/ACT IN AERO: 2.0000 | INHALATION_SPRAY | Freq: Two times a day (BID) | RESPIRATORY_TRACT | Status: DC

## 2015-08-27 NOTE — Patient Instructions (Signed)
Will arrange for new CPAP mask Chest xray today Will arrange for pulmonary function testing Follow up in 3 months

## 2015-08-27 NOTE — Progress Notes (Signed)
Chief Complaint  Patient presents with  . Follow-up    OSA. Pt states that he has not been using due the mask. He does not like the way that it covers his face. He would like to try nasal pillows or another type of non-covering mask.     History of Present Illness: Darren Mueller is a 64 y.o. male former smoker with OSA, COPD with chronic bronchitis, alcoholic cirrhosis, and secondary pulmonary hypertension.  He has not been able to use CPAP consistently.  He did not like how the mask fit.  He thinks if he had a different type of mask he would do better.  He gets flash backs to his time in [place] war when he used full face mask.  He uses proair daily.  This helps his breathing.  He ran out of advair, but felt this helped also.  He gets winded with activity.  He has home oxygen for about 2 years, but has not been using this.  His SpO2 was 98% today.  TESTS: PFT 12/27/12 >> FEV1 2.06 (59%), FEV1% 89, TLC 4.50 (61%), DLCO 53%, no BD V/Q scan 12/29/12 >> low probability PE HST 03/06/15 >> AHI 12, SaO2 low 82% Echo 04/17/15 >> EF 65 to 09%, grade 1 diastolic dysfx, mod AI, PAS 64 mmHg ONO with CPAP and RA 07/29/15 >> Test time 3 hrs 31 min. Mean SpO2 93.3%, low SpO2 82%. Spent 4 min 40 sec with SpO2 < 88%.   Past medical hx >> HTN, CKD, GERD, Gout, Hypothyroidism, Seizures, Alcoholic cirrhosis, Leukopenia/Thrombocytopenia with splenomegaly  Past surgical hx, Medications, Allergies, Family hx, Social hx all reviewed.   Physical Exam: BP 108/62 mmHg  Pulse 84  Ht 6' (1.829 m)  Wt 206 lb 12.8 oz (93.804 kg)  BMI 28.04 kg/m2  SpO2 98%  General - No distress ENT - No sinus tenderness, no oral exudate, no LAN Cardiac - s1s2 regular, no murmur Chest - b/l faint crackles clear with coughing Back - No focal tenderness Abd - Soft, non-tender Ext - No edema Neuro - Normal strength Skin - No rashes Psych - normal mood, and behavior   Assessment/Plan:  Obstructive sleep apnea. Plan: -  will have his CPAP mask refit, and then retry CPAP - if this doesn't work, then consider oral appliance  COPD with chronic bronchitis. Plan: - continue advair - prn proair - will arrange for PFT's, CXR to further assess - d/c his home oxygen set up  Pulmonary hypertension in setting of OSA, COPD, Alcoholic cirrhosis. Plan: - he is on macitentan, adcirca, and selexipag per cardiology  Hypersplenism. Plan:  - he is followed by Dr. Marin Olp with hematology    Chesley Mires, MD [place] Pager:  [phone]

## 2015-08-27 NOTE — Addendum Note (Signed)
Addended by: Jerrol Banana on: 08/27/2015 11:32 AM   Modules accepted: Orders

## 2015-08-28 ENCOUNTER — Encounter (HOSPITAL_COMMUNITY): Payer: Self-pay

## 2015-08-28 ENCOUNTER — Telehealth: Payer: Self-pay | Admitting: Pulmonary Disease

## 2015-08-28 ENCOUNTER — Encounter: Payer: Self-pay | Admitting: Pulmonary Disease

## 2015-08-28 MED ORDER — FLUTICASONE-SALMETEROL 230-21 MCG/ACT IN AERO: 2.0000 | INHALATION_SPRAY | Freq: Two times a day (BID) | RESPIRATORY_TRACT | Status: DC

## 2015-08-28 NOTE — Telephone Encounter (Signed)
rx called in to pharmacy for a 90 day supply.   Nothing further needed.

## 2015-08-28 NOTE — Progress Notes (Signed)
Maintenance Pulmonary Rehab discharge note Darren Mueller has not had regular attendance in Pulmonary Rehab. He has some activities that he participates in on Tuesdays and he has not been able to come then. I have encouraged him to get involved with exercise that does not require him to be there on the days that interferes with this. We discussed that he was not getting the full benefit of the exercise without regular attendance. I have encouraged him to join a gym or the [employer], but to continue to exercise.

## 2015-08-30 ENCOUNTER — Telehealth: Payer: Self-pay | Admitting: Pulmonary Disease

## 2015-08-30 ENCOUNTER — Encounter (HOSPITAL_COMMUNITY): Payer: Self-pay

## 2015-08-30 ENCOUNTER — Ambulatory Visit: Payer: Self-pay | Admitting: Psychiatry

## 2015-08-30 NOTE — Telephone Encounter (Signed)
Dg Chest 2 View  08/27/2015  CLINICAL DATA:  Dyspnea.  History of COPD. EXAM: CHEST  2 VIEW COMPARISON:  01/11/2013 FINDINGS: Cardiac silhouette is mildly enlarged. No mediastinal or hilar masses or evidence of adenopathy. Mild prominence of the bronchovascular markings. This is stable. No lung consolidation or edema. No pleural effusion or pneumothorax. Bony thorax is intact. IMPRESSION: No acute cardiopulmonary disease. Electronically Signed   By: Lajean Manes M.D.   On: 08/27/2015 14:01    Will have my nurse inform patient that CXR showed expected changes with hx of COPD.  No other worrisome findings.  No change to current tx plan.

## 2015-09-03 ENCOUNTER — Encounter: Payer: Self-pay | Admitting: Family Medicine

## 2015-09-03 ENCOUNTER — Telehealth: Payer: Self-pay | Admitting: Family Medicine

## 2015-09-03 ENCOUNTER — Ambulatory Visit (INDEPENDENT_AMBULATORY_CARE_PROVIDER_SITE_OTHER): Payer: Medicare Other | Admitting: Family Medicine

## 2015-09-03 VITALS — BP 124/72 | HR 99 | Temp 98.2°F | Ht 73.0 in | Wt 210.4 lb

## 2015-09-03 DIAGNOSIS — IMO0002 Reserved for concepts with insufficient information to code with codable children: Secondary | ICD-10-CM

## 2015-09-03 DIAGNOSIS — K219 Gastro-esophageal reflux disease without esophagitis: Secondary | ICD-10-CM

## 2015-09-03 DIAGNOSIS — E876 Hypokalemia: Secondary | ICD-10-CM

## 2015-09-03 DIAGNOSIS — Z23 Encounter for immunization: Secondary | ICD-10-CM

## 2015-09-03 DIAGNOSIS — I10 Essential (primary) hypertension: Secondary | ICD-10-CM

## 2015-09-03 DIAGNOSIS — N183 Chronic kidney disease, stage 3 unspecified: Secondary | ICD-10-CM

## 2015-09-03 DIAGNOSIS — N189 Chronic kidney disease, unspecified: Secondary | ICD-10-CM

## 2015-09-03 DIAGNOSIS — E038 Other specified hypothyroidism: Secondary | ICD-10-CM | POA: Diagnosis not present

## 2015-09-03 DIAGNOSIS — G4733 Obstructive sleep apnea (adult) (pediatric): Secondary | ICD-10-CM | POA: Diagnosis not present

## 2015-09-03 DIAGNOSIS — E782 Mixed hyperlipidemia: Secondary | ICD-10-CM

## 2015-09-03 DIAGNOSIS — R197 Diarrhea, unspecified: Secondary | ICD-10-CM

## 2015-09-03 DIAGNOSIS — I272 Other secondary pulmonary hypertension: Secondary | ICD-10-CM | POA: Diagnosis not present

## 2015-09-03 DIAGNOSIS — R739 Hyperglycemia, unspecified: Secondary | ICD-10-CM

## 2015-09-03 MED ORDER — FLUTICASONE-SALMETEROL 230-21 MCG/ACT IN AERO: 2.0000 | INHALATION_SPRAY | Freq: Two times a day (BID) | RESPIRATORY_TRACT | Status: DC

## 2015-09-03 NOTE — Patient Instructions (Signed)

## 2015-09-03 NOTE — Telephone Encounter (Signed)
So see if she can come in at 9:30 on this Thursday I have had a meeting cancel

## 2015-09-03 NOTE — Assessment & Plan Note (Signed)
Does not tolerate the full mask due to his time in [place] with Systems analyst, they are going to try a different type of mask

## 2015-09-03 NOTE — Telephone Encounter (Signed)
I spoke with patient about results and he verbalized understanding and had no questions 

## 2015-09-03 NOTE — Progress Notes (Signed)
Pre visit review using our clinic review tool, if applicable. No additional management support is needed unless otherwise documented below in the visit note.

## 2015-09-03 NOTE — Telephone Encounter (Signed)
Caller name:Merrell  Relationship to patient: Self  Can be reached: [phone]  Reason for call: At check out pt informed me to send a phone note for his sister to become a pt. Her name is Rosario Adie

## 2015-09-05 NOTE — Telephone Encounter (Signed)
Unfortunately I'm not showing that appointment time slot available for scheduling. I will call pt to get her scheduled on next available slot, if that's okay.

## 2015-09-16 NOTE — Assessment & Plan Note (Signed)
Well controlled, no changes to meds. Encouraged heart healthy diet such as the DASH diet and exercise as tolerated.

## 2015-09-16 NOTE — Progress Notes (Signed)
Subjective:    Patient ID: Darren Mueller, male    DOB: 10/25/1951, 64 y.o.   MRN: [id number]  Chief Complaint  Patient presents with  . Follow-up    HPI Patient is in today for follow up. No recent illness. Continues to have frequent stools but it is improving. No bloody or tarry stool. Uses Imodium AD with partial improvement. No bloody or tarry stool No significant abdominal pain, no fevers, chills. Notes persistent fatigue but stable. No new concerns. Denies CP/palp/HA/congestion/fevers/GI or GU c/o. Taking meds as prescribed  Past Medical History  Diagnosis Date  . Hypertension   . Seizures ([place])   . Hx of colonic polyps   . GERD (gastroesophageal reflux disease)   . Gout   . Alcoholic cirrhosis of liver with ascites ([place]) 2014  . Unspecified hypothyroidism 01/25/2013  . Unspecified pleural effusion   . Other chronic pulmonary heart diseases   . Hypopotassemia   . Renal insufficiency 07/18/2013  . Diarrhea 09/04/2013  . Otalgia 11/28/2013  . Dermatitis 06/10/2015  . Alcohol dependence in remission ([place]) 03/22/2011    In remission     Past Surgical History  Procedure Laterality Date  . Colonoscopy N/A 12/08/2013    Procedure: COLONOSCOPY;  Surgeon: Milus Banister, MD;  Location: [facility] ENDOSCOPY;  Service: Endoscopy;  Laterality: N/A;  . Right heart catheterization Right 06/06/2014    Procedure: RIGHT HEART CATH;  Surgeon: Jolaine Artist, MD;  Location: [facility] CATH LAB;  Service: Cardiovascular;  Laterality: Right;  . Uvulopalatopharyngoplasty  1999    Family History  Problem Relation Age of Onset  . Heart disease Mother   . Prostate cancer Father   . Colon cancer Neg Hx   . Heart attack Mother   . Hypertension Mother     Social History   Social History  . Marital Status: Divorced    Spouse Name: N/A  . Number of Children: 3  . Years of Education: N/A   Occupational History  . Retired     Social research officer, government   Social History Main Topics  . Smoking status: Former Smoker --  2.00 packs/day for 5 years    Types: Cigarettes    Quit date: 09/22/1979  . Smokeless tobacco: Never Used  . Alcohol Use: 0.0 oz/week    0 Standard drinks or equivalent per week     Comment: 2 vodka tonic mon, wed, friday  . Drug Use: No  . Sexual Activity: Not on file   Other Topics Concern  . Not on file   Social History Narrative   0 caffeine drinks daily     Outpatient Prescriptions Prior to Visit  Medication Sig Dispense Refill  . albuterol (PROVENTIL HFA;VENTOLIN HFA) 108 (90 BASE) MCG/ACT inhaler Inhale 2 puffs into the lungs every 6 (six) hours as needed for wheezing or shortness of breath.    . doxepin (SINEQUAN) 10 MG capsule TAKE 1 TO 2 CAPSULES (10 TO 20 MG TOTAL) AT BEDTIME 180 capsule 1  . Fluticasone Propionate 0.05 % LOTN Apply daily to affected area 3 Bottle 3  . furosemide (LASIX) 40 MG tablet Take one tablet every Monday, Wednesday, and Friday. 90 tablet 3  . levothyroxine (SYNTHROID, LEVOTHROID) 25 MCG tablet TAKE 1 TABLET DAILY 90 tablet 3  . loperamide (ANTI-DIARRHEAL) 2 MG capsule Take 4 mg by mouth daily.     Marland Kitchen losartan (COZAAR) 50 MG tablet Take 1 tablet (50 mg total) by mouth daily. 90 tablet 3  . Macitentan (OPSUMIT)  10 MG TABS Take 10 mg by mouth daily. 90 tablet 3  . omeprazole (PRILOSEC) 40 MG capsule TAKE 1 CAPSULE DAILY 90 capsule 2  . potassium chloride (K-DUR) 10 MEQ tablet Take 1 tablet (10 mEq total) by mouth daily. 360 tablet 3  . Selexipag 1000 MCG TABS Take 1,000 mcg by mouth 2 (two) times daily. 60 tablet 2  . spironolactone (ALDACTONE) 50 MG tablet TAKE 1 TABLET DAILY 90 tablet 1  . SYMAX-SR 0.375 MG 12 hr tablet TAKE 1 TABLET TWICE A DAY 180 tablet 1  . Tadalafil, PAH, (ADCIRCA) 20 MG TABS TAKE 2 TABLETS (40 MG TOTAL) DAILY 180 tablet 2  . UPTRAVI 800 MCG TABS Take 1 tablet by mouth 2 (two) times daily.    . verapamil (VERELAN PM) 180 MG 24 hr capsule Take 1 capsule (180 mg total) by mouth every morning. 90 capsule 1  .  fluticasone-salmeterol (ADVAIR HFA) 230-21 MCG/ACT inhaler Inhale 2 puffs into the lungs 2 (two) times daily. 3 Inhaler 3   No facility-administered medications prior to visit.    Allergies  Allergen Reactions  . Aspirin Other (See Comments)    hypertension    Review of Systems  Constitutional: Positive for malaise/fatigue. Negative for fever.  HENT: Negative for congestion.   Eyes: Negative for discharge.  Respiratory: Negative for shortness of breath.   Cardiovascular: Negative for chest pain, palpitations and leg swelling.  Gastrointestinal: Positive for diarrhea. Negative for nausea and abdominal pain.  Genitourinary: Negative for dysuria.  Musculoskeletal: Negative for falls.  Skin: Negative for rash.  Neurological: Negative for loss of consciousness and headaches.  Endo/Heme/Allergies: Negative for environmental allergies.  Psychiatric/Behavioral: Negative for depression. The patient is not nervous/anxious.        Objective:    Physical Exam  Constitutional: He is oriented to person, place, and time. He appears well-developed and well-nourished. No distress.  HENT:  Head: Normocephalic and atraumatic.  Nose: Nose normal.  Eyes: Right eye exhibits no discharge. Left eye exhibits no discharge.  Neck: Normal range of motion. Neck supple.  Cardiovascular: Normal rate and regular rhythm.   Pulmonary/Chest: Effort normal and breath sounds normal.  Abdominal: Soft. Bowel sounds are normal. There is no tenderness.  Musculoskeletal: He exhibits no edema.  Neurological: He is alert and oriented to person, place, and time.  Skin: Skin is warm and dry.  Psychiatric: He has a normal mood and affect.  Nursing note and vitals reviewed.   BP 124/72 mmHg  Pulse 99  Temp(Src) 98.2 F (36.8 C) (Oral)  Ht _0  (1.854 m)  Wt 210 lb 6 oz (95.425 kg)  BMI 27.76 kg/m2  SpO2 97% Wt Readings from Last 3 Encounters:  09/03/15 210 lb 6 oz (95.425 kg)  08/27/15 206 lb 12.8 oz  (93.804 kg)  08/08/15 208 lb 8 oz (94.575 kg)     Lab Results  Component Value Date   WBC 1.6* 06/15/2015   HGB 13.4 06/15/2015   HCT 39.5 06/15/2015   PLT 40* 06/15/2015   GLUCOSE 100 06/15/2015   CHOL 181 06/12/2015   TRIG 74.0 06/12/2015   HDL 67.50 06/12/2015   LDLDIRECT 72.0 06/01/2015   LDLCALC 99 06/12/2015   ALT 35 06/15/2015   AST 68* 06/15/2015   NA 140 06/15/2015   K 3.6 06/15/2015   CL 106 06/15/2015   CREATININE 1.5* 06/15/2015   BUN 18 06/15/2015   CO2 17* 06/15/2015   TSH 2.97 06/12/2015   PSA 0.49 11/01/2012  INR 1.2* 06/15/2015   HGBA1C 5.4 03/02/2015    Lab Results  Component Value Date   TSH 2.97 06/12/2015   Lab Results  Component Value Date   WBC 1.6* 06/15/2015   HGB 13.4 06/15/2015   HCT 39.5 06/15/2015   MCV 103* 06/15/2015   PLT 40* 06/15/2015   Lab Results  Component Value Date   NA 140 06/15/2015   K 3.6 06/15/2015   CO2 17* 06/15/2015   GLUCOSE 100 06/15/2015   BUN 18 06/15/2015   CREATININE 1.5* 06/15/2015   BILITOT 1.40 06/15/2015   ALKPHOS 145* 06/15/2015   AST 68* 06/15/2015   ALT 35 06/15/2015   PROT 8.1 06/15/2015   ALBUMIN 4.0 06/15/2015   CALCIUM 9.5 06/15/2015   ANIONGAP 8 04/17/2015   GFR 66.43 06/12/2015   Lab Results  Component Value Date   CHOL 181 06/12/2015   Lab Results  Component Value Date   HDL 67.50 06/12/2015   Lab Results  Component Value Date   LDLCALC 99 06/12/2015   Lab Results  Component Value Date   TRIG 74.0 06/12/2015   Lab Results  Component Value Date   CHOLHDL 3 06/12/2015   Lab Results  Component Value Date   HGBA1C 5.4 03/02/2015       Assessment & Plan:   Problem List Items Addressed This Visit    Secondary pulmonary hypertension ([place])   Relevant Orders   CBC   TSH   Hemoglobin A1c   Comprehensive metabolic panel   Lipid panel   OSA (obstructive sleep apnea)    Does not tolerate the full mask due to his time in [place] with Systems analyst, they are going to  try a different type of mask      Relevant Orders   CBC   TSH   Hemoglobin A1c   Comprehensive metabolic panel   Lipid panel   Hypothyroidism   Relevant Orders   CBC   TSH   Hemoglobin A1c   Comprehensive metabolic panel   Lipid panel   TSH   Hypokalemia   Relevant Orders   CBC   TSH   Hemoglobin A1c   Comprehensive metabolic panel   Lipid panel   Comp Met (CMET)   CBC with Differential/Platelet   Hypertension    Well controlled, no changes to meds. Encouraged heart healthy diet such as the DASH diet and exercise as tolerated.       Relevant Orders   CBC   TSH   Hemoglobin A1c   Comprehensive metabolic panel   Lipid panel   TSH   Lipid panel   Hemoglobin A1c   Comp Met (CMET)   CBC with Differential/Platelet   GERD (gastroesophageal reflux disease)    Avoid offending foods, start probiotics. Do not eat large meals in late evening and consider raising head of bed.       Relevant Orders   CBC   TSH   Hemoglobin A1c   Comprehensive metabolic panel   Lipid panel   Diarrhea    Persistent likely contributed to by use of necessary medications. Imodium AD prn. Does affect his ability to go out at times. Consider Cholestyramine prn when going out, will discuss with cardiology       Chronic renal insufficiency, stage III (moderate)    Stable, follows with nephrology       Other Visit Diagnoses    Encounter for immunization    -  Primary    Hyperlipidemia, mixed  Relevant Orders    Lipid panel    TSH    Lipid panel    Hemoglobin A1c    Comp Met (CMET)    CBC with Differential/Platelet    Hyperglycemia        Relevant Orders    Hemoglobin A1c    Lipid panel    Hemoglobin A1c    Comp Met (CMET)    Need for vaccination with 13-polyvalent pneumococcal conjugate vaccine        Relevant Orders    Pneumococcal conjugate vaccine 13-valent (Completed)       I am having Mr. Guglielmo maintain his loperamide, Macitentan, levothyroxine, potassium  chloride, verapamil, Tadalafil (PAH), furosemide, losartan, omeprazole, SYMAX-SR, spironolactone, Fluticasone Propionate, Selexipag, doxepin, UPTRAVI, albuterol, and fluticasone-salmeterol.  Meds ordered this encounter  Medications  . fluticasone-salmeterol (ADVAIR HFA) 230-21 MCG/ACT inhaler    Sig: Inhale 2 puffs into the lungs 2 (two) times daily.    Dispense:  3 Inhaler    Refill:  3     Penni Homans, MD

## 2015-09-16 NOTE — Assessment & Plan Note (Signed)
Avoid offending foods, start probiotics. Do not eat large meals in late evening and consider raising head of bed.  

## 2015-09-16 NOTE — Assessment & Plan Note (Signed)
Stable, follows with nephrology

## 2015-09-16 NOTE — Assessment & Plan Note (Signed)
Persistent likely contributed to by use of necessary medications. Imodium AD prn. Does affect his ability to go out at times. Consider Cholestyramine prn when going out, will discuss with cardiology

## 2015-09-24 ENCOUNTER — Other Ambulatory Visit: Payer: Self-pay | Admitting: Family Medicine

## 2015-09-24 ENCOUNTER — Telehealth: Payer: Self-pay | Admitting: Family Medicine

## 2015-09-24 MED ORDER — CHOLESTYRAMINE 4 G PO PACK: 4.0000 g | PACK | Freq: Every day | ORAL | Status: DC | PRN

## 2015-09-24 NOTE — Telephone Encounter (Signed)
-----  Message from Mosie Lukes, MD sent at 09/23/2015  5:14 PM EST ----- Notify patient that his cardiologist agrees with the occasional use of Cholestyramine to control diarrhea. Please send in a prescription for cholestyramine 4 gm in 6 oz fluids daily prn, disp 120 gm with 3 rf. Space dose 1-2 hours after cardiac meds. ----- Message -----    From: Jolaine Artist, MD    Sent: 09/23/2015   5:03 PM      To: Mosie Lukes, MD  Erline Levine,  That would be fine with me.  Sorry for the delay in getting back. I just saw this. Hope you are well. -db  ----- Message -----    From: Mosie Lukes, MD    Sent: 09/16/2015  11:11 AM      To: Jolaine Artist, MD  Our mutual patient continues to struggle with diarrhea which does hamper his activity slightly. What are your thoughts on allowing him to use an occasional dose of Cholestyramine prior to going out if he spaces it 2 hours away from his cardiac meds?

## 2015-09-24 NOTE — Telephone Encounter (Signed)
Sent in medication to [facility] on [address].

## 2015-09-25 NOTE — Telephone Encounter (Signed)
Called left message to call back 

## 2015-09-25 NOTE — Telephone Encounter (Signed)
Patient informed of prescription instructions.

## 2015-09-28 ENCOUNTER — Other Ambulatory Visit: Payer: Self-pay | Admitting: Family Medicine

## 2015-09-29 ENCOUNTER — Other Ambulatory Visit: Payer: Self-pay | Admitting: Family Medicine

## 2015-11-13 ENCOUNTER — Encounter (HOSPITAL_COMMUNITY): Payer: Self-pay | Admitting: Internal Medicine

## 2015-11-13 ENCOUNTER — Ambulatory Visit (HOSPITAL_COMMUNITY)
Admission: RE | Admit: 2015-11-13 | Discharge: 2015-11-13 | Disposition: A | Discharge status: Home / Self Care | Payer: Medicare Other | Source: Ambulatory Visit | Attending: Internal Medicine | Admitting: Internal Medicine

## 2015-11-13 VITALS — BP 104/62 | HR 81 | Wt 201.5 lb

## 2015-11-13 DIAGNOSIS — R0600 Dyspnea, unspecified: Secondary | ICD-10-CM

## 2015-11-13 DIAGNOSIS — I272 Other secondary pulmonary hypertension: Secondary | ICD-10-CM

## 2015-11-13 DIAGNOSIS — I129 Hypertensive chronic kidney disease with stage 1 through stage 4 chronic kidney disease, or unspecified chronic kidney disease: Secondary | ICD-10-CM | POA: Diagnosis not present

## 2015-11-13 DIAGNOSIS — K766 Portal hypertension: Secondary | ICD-10-CM | POA: Diagnosis not present

## 2015-11-13 DIAGNOSIS — K7030 Alcoholic cirrhosis of liver without ascites: Secondary | ICD-10-CM | POA: Diagnosis not present

## 2015-11-13 DIAGNOSIS — Z79899 Other long term (current) drug therapy: Secondary | ICD-10-CM | POA: Insufficient documentation

## 2015-11-13 DIAGNOSIS — N189 Chronic kidney disease, unspecified: Secondary | ICD-10-CM | POA: Diagnosis not present

## 2015-11-13 DIAGNOSIS — D61818 Other pancytopenia: Secondary | ICD-10-CM | POA: Diagnosis not present

## 2015-11-13 DIAGNOSIS — G4733 Obstructive sleep apnea (adult) (pediatric): Secondary | ICD-10-CM | POA: Insufficient documentation

## 2015-11-13 DIAGNOSIS — J449 Chronic obstructive pulmonary disease, unspecified: Secondary | ICD-10-CM | POA: Diagnosis not present

## 2015-11-13 DIAGNOSIS — IMO0002 Reserved for concepts with insufficient information to code with codable children: Secondary | ICD-10-CM

## 2015-11-13 DIAGNOSIS — N183 Chronic kidney disease, stage 3 unspecified: Secondary | ICD-10-CM

## 2015-11-13 DIAGNOSIS — I2721 Secondary pulmonary arterial hypertension: Secondary | ICD-10-CM

## 2015-11-13 DIAGNOSIS — K7031 Alcoholic cirrhosis of liver with ascites: Secondary | ICD-10-CM

## 2015-11-13 LAB — CBC
HEMATOCRIT: 39.8 % (ref 39.0–52.0)
HEMOGLOBIN: 13.5 g/dL (ref 13.0–17.0)
MCH: 35.4 pg — ABNORMAL HIGH (ref 26.0–34.0)
MCHC: 33.9 g/dL (ref 30.0–36.0)
MCV: 104.5 fL — AB (ref 78.0–100.0)
Platelets: 61 10*3/uL — ABNORMAL LOW (ref 150–400)
RBC: 3.81 MIL/uL — ABNORMAL LOW (ref 4.22–5.81)
RDW: 14.3 % (ref 11.5–15.5)
WBC: 2.2 10*3/uL — AB (ref 4.0–10.5)

## 2015-11-13 LAB — BASIC METABOLIC PANEL
ANION GAP: 13 (ref 5–15)
BUN: 17 mg/dL (ref 6–20)
CHLORIDE: 105 mmol/L (ref 101–111)
CO2: 21 mmol/L — ABNORMAL LOW (ref 22–32)
Calcium: 8.9 mg/dL (ref 8.9–10.3)
Creatinine, Ser: 1.75 mg/dL — ABNORMAL HIGH (ref 0.61–1.24)
GFR calc Af Amer: 45 mL/min — ABNORMAL LOW (ref >60)
GFR, EST NON AFRICAN AMERICAN: 39 mL/min — AB (ref >60)
Glucose, Bld: 112 mg/dL — ABNORMAL HIGH (ref 65–99)
POTASSIUM: 3.9 mmol/L (ref 3.5–5.1)
Sodium: 139 mmol/L (ref 135–145)

## 2015-11-13 LAB — BRAIN NATRIURETIC PEPTIDE: B Natriuretic Peptide: 317.9 pg/mL — ABNORMAL HIGH (ref 0.0–100.0)

## 2015-11-13 MED ORDER — SELEXIPAG 800 MCG PO TABS: 800.0000 ug | ORAL_TABLET | Freq: Every evening | ORAL | Status: DC

## 2015-11-13 MED ORDER — TADALAFIL (PAH) 20 MG PO TABS: ORAL_TABLET | ORAL | Status: DC

## 2015-11-13 MED ORDER — SELEXIPAG 1000 MCG PO TABS: 1000.0000 mg | ORAL_TABLET | Freq: Every morning | ORAL | Status: DC

## 2015-11-13 NOTE — Progress Notes (Signed)
ADVANCED HF CLINIC NOTE  Patient ID: Darren Mueller, male   DOB: 09/02/1951, 65 y.o.   MRN: [id number] GI : Dr Christella Hartigan PCP: Dr Abner Greenspan  Subjective:    Darren Mueller ("[name]") is a 65 yo with history of COPD, portopulmonary hypertension with RV failure, and ETOH cirrhosis presents for followup of his PAH.  RHC 2/14: RA = 13  RV = 63/5/14  PA = 65/24 (41)  PCW = 6  Hepatic wedge = 21  Fick cardiac output/index = 3.8/1.8  PVR = 9.1  FA sat = 94%  PA sat = 58%, 56%  SVC = 54%  PFTs 3/14 FEV1 2.02 L (58%) FVC 2.7L (54%) FEV1/FVC (89%) DLCO 53%  RHC 06/06/14 RA = 5  RV = 73/5/9  PA = 81/25 (46)  PCW = 7  Fick cardiac output/index = 6.0/2.8  PVR = 6.5 [place]  FA sat = 95%  PA sat = 71%, 67%  Unable to get hepatic wedge  Admitted in December 2014 with syncope in the setting of persistent diarrhea. C.diff and O&P negative.  Creatinine was up to 2.7 from 1.7 baseline. He was hydrated. Losartan and lasix stopped.. Event Monitor - NSR 10/26/13.   ECHO 12/14 EF 55-60% Peak PA pressure 35. Severe RV dysfunction  ECHO 7/15 EF 60% RV moderately to severely dilated. Moderate HK RVSP 57mm HG  ECHO 6/16 EF 60% RV moderately to severely dilated. Severe HK RVSP ~65 mm HG. D-shaped septum  Ab u/s 8/16: + cirrhosis/mild to moderate splenomegaly  6 min walk 01/10/13, 1290 feet  (01/10/14) = 1280 feet (380 m) (9/15) = 1350 feet (411 m) O2 sats ranged from 89-95% on room air, HR ranged from 100-129. (6/16) = 384 meters   He returns today for regular PAH follow up. Down 7 lbs since last visit. Had recently had to cut selexipag back to 200 BID with side effects, and since last visit have slowly titrated back up to 1000 qam/ 800 qpm. Unable to tolerate any further uptitration with HAs and worsening flushing.  Remains on Macitentan and Adcirca. Working out at [employer] every Wednesday. Hasn't had swelling or needed extra lasix. He is only taking lasix Monday and Thursday x 2 months.  No SOB at rest, has to pause and take his time at grocery store. NYHA III symptoms. Continues to drink, at least 3 times a week, and cuts himself off at 2 drinks (Vodka tonics).  No lightheadedness or dizziness. Denies presyncope/syncope.   Labs:  10/19/13 Creatinine 1.79 K 4.1  3/15        Cr 1.78 K 4.2 03/17/14    CR 1.23 K 4.0 LFTs ok 06/06/14 K 4.0 Creatinine 1.4  08/08/14 K 4.0 Cr 1.6 09/12/14 K 4.3 Cr 1.8 LFTs ok, HCT 58 12/15 K 4.3 Cr 1.15  06/2015 K 3.6, Cr 1.5  Current Outpatient Prescriptions on File Prior to Encounter  Medication Sig Dispense Refill  . albuterol (PROVENTIL HFA;VENTOLIN HFA) 108 (90 BASE) MCG/ACT inhaler Inhale 2 puffs into the lungs every 6 (six) hours as needed for wheezing or shortness of breath.    . cholestyramine (QUESTRAN) 4 G packet Take 1 packet (4 g total) by mouth daily as needed (mix in 6oz fluid. Space 1 to 2 hours after cardiac medications.). 60 each 3  . doxepin (SINEQUAN) 10 MG capsule TAKE 1 TO 2 CAPSULES (10 TO 20 MG TOTAL) AT BEDTIME 180 capsule 1  . Fluticasone Propionate 0.05 % LOTN Apply daily  to affected area 3 Bottle 3  . fluticasone-salmeterol (ADVAIR HFA) 230-21 MCG/ACT inhaler Inhale 2 puffs into the lungs 2 (two) times daily. 3 Inhaler 3  . furosemide (LASIX) 40 MG tablet Take one tablet every Monday, Wednesday, and Friday. 90 tablet 3  . levothyroxine (SYNTHROID, LEVOTHROID) 25 MCG tablet TAKE 1 TABLET DAILY 90 tablet 3  . loperamide (ANTI-DIARRHEAL) 2 MG capsule Take 4 mg by mouth daily.     Marland Kitchen losartan (COZAAR) 50 MG tablet Take 1 tablet (50 mg total) by mouth daily. 90 tablet 3  . Macitentan (OPSUMIT) 10 MG TABS Take 10 mg by mouth daily. 90 tablet 3  . omeprazole (PRILOSEC) 40 MG capsule TAKE 1 CAPSULE DAILY 90 capsule 2  . potassium chloride (K-DUR) 10 MEQ tablet Take 1 tablet (10 mEq total) by mouth daily. 360 tablet 3  . Selexipag 1000 MCG TABS Take 1,000 mcg by mouth 2 (two) times daily. 60 tablet 2  . spironolactone (ALDACTONE) 50  MG tablet TAKE 1 TABLET DAILY 90 tablet 0  . SYMAX-SR 0.375 MG 12 hr tablet TAKE 1 TABLET TWICE A DAY 180 tablet 0  . Tadalafil, PAH, (ADCIRCA) 20 MG TABS TAKE 2 TABLETS (40 MG TOTAL) DAILY 180 tablet 2  . UPTRAVI 800 MCG TABS Take 1 tablet by mouth 2 (two) times daily.    . verapamil (VERELAN PM) 180 MG 24 hr capsule TAKE 1 CAPSULE AT BEDTIME 90 capsule 1   No current facility-administered medications on file prior to encounter.    Objective:   Weight Range:  Vital Signs:   Pulse Rate:  [81] 81 (01/10 0856) BP: (104)/(62) 104/62 mmHg (01/10 0856) SpO2:  [98 %] 98 % (01/10 0856) Weight:  [201 lb 8 oz (91.4 kg)] 201 lb 8 oz (91.4 kg) (01/10 0856)    Wt Readings from Last 3 Encounters:  11/13/15 201 lb 8 oz (91.4 kg)  09/03/15 210 lb 6 oz (95.425 kg)  08/27/15 206 lb 12.8 oz (93.804 kg)     Physical Exam: General: NAD, facial flushing.  Neck: JVP ~7-8 cm with very mild HJR, no thyromegaly or thyroid nodule.  Lungs: CTAB, normal effort. CV: Nondisplaced PMI. Heart regular  2/6 TR P2 LLSB. No carotid bruit.   Abdomen: Soft, NT, ND, no HSM. No bruits or masses. +BS  Ext: warm, No clubbing, no edema.Normal pedal pulses. Skin: Intact without lesions or rashes.  Neurologic: Alert and oriented x 3.  Psych: Normal affect.  Extremities: No clubbing or cyanosis.  HEENT: Normal. Except for erythematous rash/flushing   Assessment/Plan    1. PAH: Suspected portopulmonary HTN. - NYHA III symptoms on triple therapy  - Continue macitentan 10 daily, adcirca 40 mg daily, selexipag 1000 qam/800 qpm. - He gets "terrible" HAs and worsening flushing if he increase selexipag further will continue current dosing. - Volume status stable with lasix. Continue Mon-Thursday with an extra 20 meq Kdur   - Continues to drink several days a week. Again stressed importance of cessation. - Unable to tolerate treprostinil (06/30/14) due to flushing, HAs and diarrhea - Should use 02 as needed. 2. CKD:  -  Check BMET/BNP today. Has been stable at previous checks.  3. ETOH cirrhosis:  - As above, again recommended complete cessation of ETOH.  4. HTN:  - Stable on current regimen. Continue losartan 50 mg daily. 5. OSA:  -Has mild OSA with AHI 12 and desats down to 82%.  - Intolerate CPAP.  - Have encouraged to use a pulse  ox and use 02 prn to keep sats > 90% 6. Pancytopenia:  Progressive. Most likely due to cirrhosis. Has seen Dr. Myna Hidalgo in Hematology.    Graciella Freer, PA-C 11/13/2015  Patient seen and examined with Otilio Saber, PA-C. We discussed all aspects of the encounter. I agree with the assessment and plan as stated above.   He is stable NYHA II-III PAH on triple therapy. Volume status looks good. Will do echo and at next visit. I am quite concerned about progressive cirrhosis and once again advised him strongly to stop drinking. Will refer to GI for any further recommendations. Repeat CBC and BMET today to look at blood counts and renal function.   Ikeem Cleckler,MD 10:14 AM

## 2015-11-13 NOTE — Patient Instructions (Signed)
Labs today  Please schedule follow up with Dr Ardis Hughs, GI  Your physician recommends that you schedule a follow-up appointment in: 1 month with echocardiogram

## 2015-11-13 NOTE — Addendum Note (Signed)
Encounter addended by: Scarlette Calico, RN on: 11/13/2015 10:25 AM<BR>     Documentation filed: Visit Diagnoses, Patient Instructions Section, Dx Association, Orders

## 2015-11-16 ENCOUNTER — Other Ambulatory Visit: Payer: Self-pay | Admitting: Family Medicine

## 2015-11-26 NOTE — Addendum Note (Signed)
Encounter addended by: Scarlette Calico, RN on: 11/26/2015  4:01 PM<BR>     Documentation filed: Visit Diagnoses, Dx Association, Orders

## 2015-11-27 ENCOUNTER — Telehealth: Payer: Self-pay | Admitting: Family Medicine

## 2015-11-27 NOTE — Telephone Encounter (Signed)
Caller name: Vincen   Relationship to patient: Self  Can be reached: [phone]  Pharmacy: [facility]  Reason for call: Pt says that he has the flu. He would like to know if pcp could call in a z-pack to pharmacy. Pt has an appt w/provider on next Tuesday.    If sent in, pt is requesting a confirmation call back

## 2015-11-27 NOTE — Telephone Encounter (Signed)
The patients has been exposed (living in the same house) to 3 family members with the flu. His symptoms are cough, sore throat, no fever.

## 2015-11-27 NOTE — Telephone Encounter (Signed)
He needs Tamiflu 75 mg tabs 1 tab po daily x 10 days

## 2015-11-28 MED ORDER — OSELTAMIVIR PHOSPHATE 75 MG PO CAPS: 75.0000 mg | ORAL_CAPSULE | Freq: Every day | ORAL | Status: DC

## 2015-11-28 MED FILL — OSELTAMIVIR PHOS 75 MG CAP: 75 | 10 days supply | Qty: 10 | Fill #0

## 2015-11-28 NOTE — Telephone Encounter (Signed)
Sent in as instructed tamiflu to [facility].  Patient aware.

## 2015-12-04 ENCOUNTER — Ambulatory Visit (HOSPITAL_BASED_OUTPATIENT_CLINIC_OR_DEPARTMENT_OTHER)
Admission: RE | Admit: 2015-12-04 | Discharge: 2015-12-04 | Disposition: A | Discharge status: Home / Self Care | Payer: Medicare Other | Source: Ambulatory Visit | Attending: Family Medicine | Admitting: Family Medicine

## 2015-12-04 ENCOUNTER — Ambulatory Visit (INDEPENDENT_AMBULATORY_CARE_PROVIDER_SITE_OTHER): Payer: Medicare Other | Admitting: Family Medicine

## 2015-12-04 ENCOUNTER — Encounter: Payer: Self-pay | Admitting: Family Medicine

## 2015-12-04 VITALS — BP 127/73 | HR 83 | Temp 97.9°F | Ht 73.0 in | Wt 207.0 lb

## 2015-12-04 DIAGNOSIS — R197 Diarrhea, unspecified: Secondary | ICD-10-CM

## 2015-12-04 DIAGNOSIS — R079 Chest pain, unspecified: Secondary | ICD-10-CM | POA: Diagnosis not present

## 2015-12-04 DIAGNOSIS — R0789 Other chest pain: Secondary | ICD-10-CM | POA: Diagnosis not present

## 2015-12-04 DIAGNOSIS — I272 Other secondary pulmonary hypertension: Secondary | ICD-10-CM

## 2015-12-04 DIAGNOSIS — M545 Low back pain, unspecified: Secondary | ICD-10-CM

## 2015-12-04 DIAGNOSIS — I10 Essential (primary) hypertension: Secondary | ICD-10-CM

## 2015-12-04 DIAGNOSIS — I517 Cardiomegaly: Secondary | ICD-10-CM | POA: Insufficient documentation

## 2015-12-04 DIAGNOSIS — E038 Other specified hypothyroidism: Secondary | ICD-10-CM

## 2015-12-04 DIAGNOSIS — K219 Gastro-esophageal reflux disease without esophagitis: Secondary | ICD-10-CM | POA: Diagnosis not present

## 2015-12-04 DIAGNOSIS — N183 Chronic kidney disease, stage 3 unspecified: Secondary | ICD-10-CM

## 2015-12-04 DIAGNOSIS — N189 Chronic kidney disease, unspecified: Secondary | ICD-10-CM

## 2015-12-04 DIAGNOSIS — K7031 Alcoholic cirrhosis of liver with ascites: Secondary | ICD-10-CM | POA: Diagnosis not present

## 2015-12-04 DIAGNOSIS — I2721 Secondary pulmonary arterial hypertension: Secondary | ICD-10-CM

## 2015-12-04 DIAGNOSIS — R918 Other nonspecific abnormal finding of lung field: Secondary | ICD-10-CM | POA: Insufficient documentation

## 2015-12-04 DIAGNOSIS — K766 Portal hypertension: Secondary | ICD-10-CM

## 2015-12-04 DIAGNOSIS — G4700 Insomnia, unspecified: Secondary | ICD-10-CM

## 2015-12-04 DIAGNOSIS — F1020 Alcohol dependence, uncomplicated: Secondary | ICD-10-CM

## 2015-12-04 DIAGNOSIS — E876 Hypokalemia: Secondary | ICD-10-CM

## 2015-12-04 LAB — COMPREHENSIVE METABOLIC PANEL
ALBUMIN: 4 g/dL (ref 3.5–5.2)
ALK PHOS: 162 U/L — AB (ref 39–117)
ALT: 33 U/L (ref 0–53)
AST: 75 U/L — AB (ref 0–37)
BUN: 18 mg/dL (ref 6–23)
CALCIUM: 9.5 mg/dL (ref 8.4–10.5)
CHLORIDE: 107 meq/L (ref 96–112)
CO2: 24 mEq/L (ref 19–32)
CREATININE: 1.29 mg/dL (ref 0.40–1.50)
GFR: 71.7 mL/min (ref >60.00)
Glucose, Bld: 102 mg/dL — ABNORMAL HIGH (ref 70–99)
POTASSIUM: 3.7 meq/L (ref 3.5–5.1)
SODIUM: 139 meq/L (ref 135–145)
TOTAL PROTEIN: 7.8 g/dL (ref 6.0–8.3)
Total Bilirubin: 1.4 mg/dL — ABNORMAL HIGH (ref 0.2–1.2)

## 2015-12-04 LAB — PROTIME-INR
INR: 1.3 ratio — AB (ref 0.8–1.0)
PROTHROMBIN TIME: 13.3 s — AB (ref 9.6–13.1)

## 2015-12-04 LAB — LIPID PANEL
CHOLESTEROL: 163 mg/dL (ref 0–200)
HDL: 69.9 mg/dL (ref >39.00)
LDL Cholesterol: 77 mg/dL (ref 0–99)
NonHDL: 93.35
TRIGLYCERIDES: 80 mg/dL (ref 0.0–149.0)
Total CHOL/HDL Ratio: 2
VLDL: 16 mg/dL (ref 0.0–40.0)

## 2015-12-04 LAB — TSH: TSH: 2.47 u[IU]/mL (ref 0.35–4.50)

## 2015-12-04 MED ORDER — VERAPAMIL HCL ER 180 MG PO CP24: 180.0000 mg | ORAL_CAPSULE | Freq: Every day | ORAL | Status: DC

## 2015-12-04 MED ORDER — LEVOTHYROXINE SODIUM 25 MCG PO TABS: 25.0000 ug | ORAL_TABLET | Freq: Every day | ORAL | Status: DC

## 2015-12-04 MED ORDER — OMEPRAZOLE 40 MG PO CPDR: 40.0000 mg | DELAYED_RELEASE_CAPSULE | Freq: Every day | ORAL | Status: DC

## 2015-12-04 MED ORDER — HYOSCYAMINE SULFATE ER 0.375 MG PO TB12: 0.3750 mg | ORAL_TABLET | Freq: Two times a day (BID) | ORAL | Status: DC

## 2015-12-04 MED ORDER — SPIRONOLACTONE 50 MG PO TABS: 50.0000 mg | ORAL_TABLET | Freq: Every day | ORAL | Status: DC

## 2015-12-04 NOTE — Assessment & Plan Note (Signed)
Well controlled, no changes to meds. Encouraged heart healthy diet such as the DASH diet and exercise as tolerated.

## 2015-12-04 NOTE — Assessment & Plan Note (Signed)
On Levothyroxine, continue to monitor

## 2015-12-04 NOTE — Progress Notes (Signed)
Pre visit review using our clinic review tool, if applicable. No additional management support is needed unless otherwise documented below in the visit note.

## 2015-12-04 NOTE — Patient Instructions (Addendum)
Icy hot, Salon Pas or Aspercreme   Cholesterol is a white, waxy, fat-like substance needed by your body in small amounts. The liver makes all the cholesterol you need. Cholesterol is carried from the liver by the blood through the blood vessels. Deposits of cholesterol (plaque) may build up on blood vessel walls. These make the arteries narrower and stiffer. Cholesterol plaques increase the risk for heart attack and stroke.  You cannot feel your cholesterol level even if it is very high. The only way to know it is high is with a blood test. Once you know your cholesterol levels, you should keep a record of the test results. Work with your health care provider to keep your levels in the desired range.  WHAT DO THE RESULTS MEAN?  Total cholesterol is a rough measure of all the cholesterol in your blood.   LDL is the so-called bad cholesterol. This is the type that deposits cholesterol in the walls of the arteries. You want this level to be low.   HDL is the good cholesterol because it cleans the arteries and carries the LDL away. You want this level to be high.  Triglycerides are fat that the body can either burn for energy or store. High levels are closely linked to heart disease.  WHAT ARE THE DESIRED LEVELS OF CHOLESTEROL?  Total cholesterol below 200.   LDL below 100 for people at risk, below 70 for those at very high risk.   HDL above 50 is good, above 60 is best.   Triglycerides below 150.  HOW CAN I LOWER MY CHOLESTEROL?  Diet. Follow your diet programs as directed by your health care provider.   Choose fish or white meat chicken and [place], roasted or baked. Limit fatty cuts of red meat, fried foods, and processed meats, such as sausage and lunch meats.   Eat lots of fresh fruits and vegetables.  Choose whole grains, beans, pasta, potatoes, and cereals.   Use only small amounts of olive, corn, or canola oils.   Avoid butter, mayonnaise, shortening, or palm kernel  oils.  Avoid foods with trans fats.   Drink skim or nonfat milk and eat low-fat or nonfat yogurt and cheeses. Avoid whole milk, cream, ice cream, egg yolks, and full-fat cheeses.   Healthy desserts include angel food cake, ginger snaps, animal crackers, hard candy, popsicles, and low-fat or nonfat frozen yogurt. Avoid pastries, cakes, pies, and cookies.   Exercise. Follow your exercise programs as directed by your health care provider.   A regular program helps decrease LDL and raise HDL.   A regular program helps with weight control.   Do things that increase your activity level like gardening, walking, or taking the stairs. Ask your health care provider about how you can be more active in your daily life.   Medicine. Take medicine only as directed by your health care provider.   Medicine may be prescribed by your health care provider to help lower cholesterol and decrease the risk for heart disease.   If you have several risk factors, you may need medicine even if your levels are normal.   This information is not intended to replace advice given to you by your health care provider. Make sure you discuss any questions you have with your health care provider.   Document Released: 07/15/2001 Document Revised: 11/10/2014 Document Reviewed: 08/03/2013 Elsevier Interactive Patient Education [employer].

## 2015-12-05 ENCOUNTER — Other Ambulatory Visit (HOSPITAL_COMMUNITY): Payer: Self-pay | Admitting: Cardiology

## 2015-12-05 ENCOUNTER — Telehealth: Payer: Self-pay | Admitting: Family Medicine

## 2015-12-05 DIAGNOSIS — R161 Splenomegaly, not elsewhere classified: Secondary | ICD-10-CM

## 2015-12-05 HISTORY — DX: Splenomegaly, not elsewhere classified: R16.1

## 2015-12-05 NOTE — Telephone Encounter (Signed)
Caller name: Theador   Relationship to patient: Self   Can be reached: [phone]  Reason for call: pt is requesting a call back from assistant he says that you you were helping to look into his lab results.

## 2015-12-06 NOTE — Telephone Encounter (Signed)
Patient wanted to let Dr. Haroldine Laws know that Darren Mueller, patient's PCP told him yesterday he has atelectasis on the left sde

## 2015-12-09 ENCOUNTER — Encounter: Payer: Self-pay | Admitting: Family Medicine

## 2015-12-09 DIAGNOSIS — R0789 Other chest pain: Secondary | ICD-10-CM | POA: Insufficient documentation

## 2015-12-09 DIAGNOSIS — M545 Low back pain, unspecified: Secondary | ICD-10-CM | POA: Insufficient documentation

## 2015-12-09 NOTE — Assessment & Plan Note (Signed)
Xray unremarkable. No injury, encouraged to try moist heat and topical treatments. Report if symptoms persist or worsen

## 2015-12-09 NOTE — Progress Notes (Signed)
Patient ID: Darren Mueller, male   DOB: 15-Oct-1951, 65 y.o.   MRN: [id number]   Subjective:    Patient ID: Darren Mueller, male    DOB: 1951/10/19, 65 y.o.   MRN: [id number]  Chief Complaint  Patient presents with  . Follow-up    HPI Patient is in today for follow up. He continues to struggle with fatigue, malaise and he acknowledges he is drinking alcohol routinely. No recent illness or hospitalization. He is complaining of some mid back paina nd chest wall pain all of which worsens and improves with movement and position changes.  Denies CP/palp/SOB/HA/congestion/fevers or GU c/o. Taking meds as prescribed. Still noting loose stool several times daily. No bloody or tarry stool are noted.  Past Medical History  Diagnosis Date  . Hypertension   . Seizures ([place])   . Hx of colonic polyps   . GERD (gastroesophageal reflux disease)   . Gout   . Alcoholic cirrhosis of liver with ascites ([place]) 2014  . Unspecified hypothyroidism 01/25/2013  . Unspecified pleural effusion   . Other chronic pulmonary heart diseases   . Hypopotassemia   . Renal insufficiency 07/18/2013  . Diarrhea 09/04/2013  . Otalgia 11/28/2013  . Dermatitis 06/10/2015  . Alcohol dependence in remission ([place]) 03/22/2011    In remission   . Alcohol dependence ([place]) 03/22/2011    In remission     Past Surgical History  Procedure Laterality Date  . Colonoscopy N/A 12/08/2013    Procedure: COLONOSCOPY;  Surgeon: Milus Banister, MD;  Location: [facility] ENDOSCOPY;  Service: Endoscopy;  Laterality: N/A;  . Right heart catheterization Right 06/06/2014    Procedure: RIGHT HEART CATH;  Surgeon: Jolaine Artist, MD;  Location: [facility] CATH LAB;  Service: Cardiovascular;  Laterality: Right;  . Uvulopalatopharyngoplasty  1999    Family History  Problem Relation Age of Onset  . Heart disease Mother   . Prostate cancer Father   . Colon cancer Neg Hx   . Heart attack Mother   . Hypertension Mother     Social History   Social History  .  Marital Status: Divorced    Spouse Name: N/A  . Number of Children: 3  . Years of Education: N/A   Occupational History  . Retired     Social research officer, government   Social History Main Topics  . Smoking status: Former Smoker -- 2.00 packs/day for 5 years    Types: Cigarettes    Quit date: 09/22/1979  . Smokeless tobacco: Never Used  . Alcohol Use: 0.0 oz/week    0 Standard drinks or equivalent per week     Comment: 2 vodka tonic mon, wed, friday  . Drug Use: No  . Sexual Activity: Not on file   Other Topics Concern  . Not on file   Social History Narrative   0 caffeine drinks daily     Outpatient Prescriptions Prior to Visit  Medication Sig Dispense Refill  . albuterol (PROVENTIL HFA;VENTOLIN HFA) 108 (90 BASE) MCG/ACT inhaler Inhale 2 puffs into the lungs every 6 (six) hours as needed for wheezing or shortness of breath.    . Fluticasone Propionate 0.05 % LOTN Apply daily to affected area 3 Bottle 3  . fluticasone-salmeterol (ADVAIR HFA) 230-21 MCG/ACT inhaler Inhale 2 puffs into the lungs daily.    . furosemide (LASIX) 40 MG tablet Take 40 mg by mouth 2 (two) times a week. Take on Monday and Thursday    . loperamide (ANTI-DIARRHEAL) 2 MG capsule Take 4 mg  by mouth daily.     Marland Kitchen losartan (COZAAR) 50 MG tablet Take 1 tablet (50 mg total) by mouth daily. 90 tablet 3  . Macitentan (OPSUMIT) 10 MG TABS Take 10 mg by mouth daily. 90 tablet 3  . Melatonin 5 MG TABS Take 10 mg by mouth at bedtime.    Marland Kitchen oseltamivir (TAMIFLU) 75 MG capsule Take 1 capsule (75 mg total) by mouth daily. Take for 10 days. 10 capsule 0  . potassium chloride (K-DUR) 10 MEQ tablet Take 1 tablet (10 mEq total) by mouth daily. 360 tablet 3  . Selexipag (UPTRAVI) 1000 MCG TABS Take 1,000 mg by mouth every morning. 90 tablet 2  . Selexipag (UPTRAVI) 800 MCG TABS Take 1 tablet (800 mcg total) by mouth every evening. 90 tablet 2  . Tadalafil, PAH, (ADCIRCA) 20 MG TABS TAKE 2 TABLETS (40 MG TOTAL) DAILY 180 tablet 2  .  levothyroxine (SYNTHROID, LEVOTHROID) 25 MCG tablet TAKE 1 TABLET DAILY 90 tablet 0  . omeprazole (PRILOSEC) 40 MG capsule TAKE 1 CAPSULE DAILY 90 capsule 2  . spironolactone (ALDACTONE) 50 MG tablet TAKE 1 TABLET DAILY 90 tablet 0  . SYMAX-SR 0.375 MG 12 hr tablet TAKE 1 TABLET TWICE A DAY 180 tablet 0  . verapamil (VERELAN PM) 180 MG 24 hr capsule TAKE 1 CAPSULE AT BEDTIME 90 capsule 1   No facility-administered medications prior to visit.    Allergies  Allergen Reactions  . Aspirin Other (See Comments)    hypertension    Review of Systems  Constitutional: Positive for malaise/fatigue. Negative for fever.  HENT: Negative for congestion.   Eyes: Negative for discharge.  Respiratory: Positive for shortness of breath.   Cardiovascular: Negative for chest pain, palpitations and leg swelling.  Gastrointestinal: Negative for nausea and abdominal pain.  Genitourinary: Negative for dysuria.  Musculoskeletal: Negative for falls.  Skin: Negative for rash.  Neurological: Negative for loss of consciousness and headaches.  Endo/Heme/Allergies: Negative for environmental allergies.  Psychiatric/Behavioral: Negative for depression. The patient is not nervous/anxious.        Objective:    Physical Exam  Constitutional: He is oriented to person, place, and time. He appears well-developed and well-nourished. No distress.  HENT:  Head: Normocephalic and atraumatic.  Nose: Nose normal.  Eyes: Right eye exhibits no discharge. Left eye exhibits no discharge.  Neck: Normal range of motion. Neck supple.  Cardiovascular: Normal rate and regular rhythm.   Murmur heard. Pulmonary/Chest: Effort normal and breath sounds normal.  Abdominal: Soft. Bowel sounds are normal. There is no tenderness.  Musculoskeletal: He exhibits no edema.  Neurological: He is alert and oriented to person, place, and time.  Skin: Skin is warm and dry.  Psychiatric: He has a normal mood and affect.  Nursing note and  vitals reviewed.   BP 127/73 mmHg  Pulse 83  Temp(Src) 97.9 F (36.6 C) (Oral)  Ht _0  (1.854 m)  Wt 207 lb (93.895 kg)  BMI 27.32 kg/m2  SpO2 98% Wt Readings from Last 3 Encounters:  12/04/15 207 lb (93.895 kg)  11/13/15 201 lb 8 oz (91.4 kg)  09/03/15 210 lb 6 oz (95.425 kg)     Lab Results  Component Value Date   WBC 2.2* 11/13/2015   HGB 13.5 11/13/2015   HCT 39.8 11/13/2015   PLT 61* 11/13/2015   GLUCOSE 102* 12/04/2015   CHOL 163 12/04/2015   TRIG 80.0 12/04/2015   HDL 69.90 12/04/2015   LDLDIRECT 72.0 06/01/2015   Joseph  77 12/04/2015   ALT 33 12/04/2015   AST 75* 12/04/2015   NA 139 12/04/2015   K 3.7 12/04/2015   CL 107 12/04/2015   CREATININE 1.29 12/04/2015   BUN 18 12/04/2015   CO2 24 12/04/2015   TSH 2.47 12/04/2015   PSA 0.49 11/01/2012   INR 1.3* 12/04/2015   HGBA1C 5.4 03/02/2015    Lab Results  Component Value Date   TSH 2.47 12/04/2015   Lab Results  Component Value Date   WBC 2.2* 11/13/2015   HGB 13.5 11/13/2015   HCT 39.8 11/13/2015   MCV 104.5* 11/13/2015   PLT 61* 11/13/2015   Lab Results  Component Value Date   NA 139 12/04/2015   K 3.7 12/04/2015   CO2 24 12/04/2015   GLUCOSE 102* 12/04/2015   BUN 18 12/04/2015   CREATININE 1.29 12/04/2015   BILITOT 1.4* 12/04/2015   ALKPHOS 162* 12/04/2015   AST 75* 12/04/2015   ALT 33 12/04/2015   PROT 7.8 12/04/2015   ALBUMIN 4.0 12/04/2015   CALCIUM 9.5 12/04/2015   ANIONGAP 13 11/13/2015   GFR 71.70 12/04/2015   Lab Results  Component Value Date   CHOL 163 12/04/2015   Lab Results  Component Value Date   HDL 69.90 12/04/2015   Lab Results  Component Value Date   LDLCALC 77 12/04/2015   Lab Results  Component Value Date   TRIG 80.0 12/04/2015   Lab Results  Component Value Date   CHOLHDL 2 12/04/2015   Lab Results  Component Value Date   HGBA1C 5.4 03/02/2015       Assessment & Plan:   Problem List Items Addressed This Visit    Alcohol dependence  ([place])    Has been drinking and is encouraged to consider treatment and to proceed with complete remission. Declines at this time.      Chest wall pain   Relevant Orders   DG Chest 2 View (Completed)   Chronic renal insufficiency, stage III (moderate)   Cirrhosis with alcoholism ([place]) - Primary    Liver functions up. Discussed need for complete cessation at this time and minimize other simple carbs as well      Relevant Orders   INR/PT (Completed)   INR/PT   Diarrhea    Improved some but persistent. May use cholestyramine prn.       GERD (gastroesophageal reflux disease)    Avoid offending foods, start probiotics. Do not eat large meals in late evening and consider raising head of bed.       Relevant Medications   omeprazole (PRILOSEC) 40 MG capsule   hyoscyamine (SYMAX-SR) 0.375 MG 12 hr tablet   Hypertension    Well controlled, no changes to meds. Encouraged heart healthy diet such as the DASH diet and exercise as tolerated.       Relevant Medications   verapamil (VERELAN PM) 180 MG 24 hr capsule   spironolactone (ALDACTONE) 50 MG tablet   Other Relevant Orders   TSH (Completed)   Comprehensive metabolic panel (Completed)   Lipid panel (Completed)   Hypokalemia   Hypothyroidism    On Levothyroxine, continue to monitor      Relevant Medications   levothyroxine (SYNTHROID, LEVOTHROID) 25 MCG tablet   Insomnia   Midline low back pain without sciatica    Xray unremarkable. No injury, encouraged to try moist heat and topical treatments. Report if symptoms persist or worsen      Relevant Orders   DG Lumbar Spine Complete (  Completed)   PAH (pulmonary arterial hypertension) with portal hypertension ([facility])    Following closely with cardiology and stable on current meds      Relevant Medications   verapamil (VERELAN PM) 180 MG 24 hr capsule   spironolactone (ALDACTONE) 50 MG tablet      I have changed Mr. Godwin SYMAX-SR to hyoscyamine. I have also changed his  levothyroxine, omeprazole, verapamil, and spironolactone. I am also having him maintain his loperamide, Macitentan, potassium chloride, losartan, Fluticasone Propionate, albuterol, Melatonin, fluticasone-salmeterol, furosemide, Tadalafil (PAH), Selexipag, Selexipag, and oseltamivir.  Meds ordered this encounter  Medications  . levothyroxine (SYNTHROID, LEVOTHROID) 25 MCG tablet    Sig: Take 1 tablet (25 mcg total) by mouth daily.    Dispense:  90 tablet    Refill:  1  . omeprazole (PRILOSEC) 40 MG capsule    Sig: Take 1 capsule (40 mg total) by mouth daily.    Dispense:  90 capsule    Refill:  2  . verapamil (VERELAN PM) 180 MG 24 hr capsule    Sig: Take 1 capsule (180 mg total) by mouth at bedtime.    Dispense:  90 capsule    Refill:  1  . hyoscyamine (SYMAX-SR) 0.375 MG 12 hr tablet    Sig: Take 1 tablet (0.375 mg total) by mouth 2 (two) times daily.    Dispense:  180 tablet    Refill:  0  . spironolactone (ALDACTONE) 50 MG tablet    Sig: Take 1 tablet (50 mg total) by mouth daily.    Dispense:  90 tablet    Refill:  1     Penni Homans, MD

## 2015-12-09 NOTE — Assessment & Plan Note (Signed)
Following closely with cardiology and stable on current meds

## 2015-12-09 NOTE — Assessment & Plan Note (Signed)
Avoid offending foods, start probiotics. Do not eat large meals in late evening and consider raising head of bed.  

## 2015-12-09 NOTE — Assessment & Plan Note (Signed)
Improved some but persistent. May use cholestyramine prn.

## 2015-12-09 NOTE — Assessment & Plan Note (Signed)
Has been drinking and is encouraged to consider treatment and to proceed with complete remission. Declines at this time.

## 2015-12-09 NOTE — Assessment & Plan Note (Signed)
Liver functions up. Discussed need for complete cessation at this time and minimize other simple carbs as well

## 2015-12-13 ENCOUNTER — Ambulatory Visit (INDEPENDENT_AMBULATORY_CARE_PROVIDER_SITE_OTHER): Payer: Medicare Other | Admitting: Pulmonary Disease

## 2015-12-13 ENCOUNTER — Encounter: Payer: Self-pay | Admitting: Pulmonary Disease

## 2015-12-13 ENCOUNTER — Ambulatory Visit (INDEPENDENT_AMBULATORY_CARE_PROVIDER_SITE_OTHER)
Admission: RE | Admit: 2015-12-13 | Discharge: 2015-12-13 | Disposition: A | Discharge status: Home / Self Care | Payer: Medicare Other | Source: Ambulatory Visit | Attending: Pulmonary Disease | Admitting: Pulmonary Disease

## 2015-12-13 DIAGNOSIS — R05 Cough: Secondary | ICD-10-CM | POA: Diagnosis not present

## 2015-12-13 DIAGNOSIS — G4733 Obstructive sleep apnea (adult) (pediatric): Secondary | ICD-10-CM

## 2015-12-13 DIAGNOSIS — J4489 Other specified chronic obstructive pulmonary disease: Secondary | ICD-10-CM

## 2015-12-13 DIAGNOSIS — J449 Chronic obstructive pulmonary disease, unspecified: Secondary | ICD-10-CM

## 2015-12-13 DIAGNOSIS — I272 Other secondary pulmonary hypertension: Secondary | ICD-10-CM

## 2015-12-13 DIAGNOSIS — IMO0002 Reserved for concepts with insufficient information to code with codable children: Secondary | ICD-10-CM

## 2015-12-13 LAB — PULMONARY FUNCTION TEST
DL/VA % PRED: 78 %
DL/VA: 3.72 ml/min/mmHg/L
DLCO unc % pred: 44 %
DLCO unc: 16.17 ml/min/mmHg
FEF 25-75 POST: 1.96 L/s
FEF 25-75 Pre: 2.34 L/sec
FEF2575-%Change-Post: -16 %
FEF2575-%Pred-Post: 66 %
FEF2575-%Pred-Pre: 79 %
FEV1-%Change-Post: -4 %
FEV1-%PRED-PRE: 61 %
FEV1-%Pred-Post: 58 %
FEV1-POST: 1.96 L
FEV1-PRE: 2.05 L
FEV1FVC-%Change-Post: 4 %
FEV1FVC-%PRED-PRE: 107 %
FEV6-%Change-Post: -8 %
FEV6-%PRED-POST: 53 %
FEV6-%PRED-PRE: 58 %
FEV6-POST: 2.26 L
FEV6-Pre: 2.48 L
FEV6FVC-%CHANGE-POST: 0 %
FEV6FVC-%PRED-POST: 104 %
FEV6FVC-%PRED-PRE: 104 %
FVC-%Change-Post: -8 %
FVC-%PRED-POST: 51 %
FVC-%PRED-PRE: 56 %
FVC-POST: 2.26 L
FVC-Pre: 2.48 L
PRE FEV6/FVC RATIO: 100 %
Post FEV1/FVC ratio: 87 %
Post FEV6/FVC ratio: 100 %
Pre FEV1/FVC ratio: 83 %
RV % PRED: 68 %
RV: 1.74 L
TLC % PRED: 55 %
TLC: 4.21 L

## 2015-12-13 NOTE — Progress Notes (Signed)
PFT done today. 

## 2015-12-13 NOTE — Patient Instructions (Signed)
Chest xray today Will arrange for overnight oxygen test  Follow up in 6 months

## 2015-12-13 NOTE — Progress Notes (Signed)
Current Outpatient Prescriptions on File Prior to Visit  Medication Sig  . albuterol (PROVENTIL HFA;VENTOLIN HFA) 108 (90 BASE) MCG/ACT inhaler Inhale 2 puffs into the lungs every 6 (six) hours as needed for wheezing or shortness of breath.  . Fluticasone Propionate 0.05 % LOTN Apply daily to affected area  . furosemide (LASIX) 40 MG tablet Take 40 mg by mouth 2 (two) times a week. Take on Monday and Thursday  . hyoscyamine (SYMAX-SR) 0.375 MG 12 hr tablet Take 1 tablet (0.375 mg total) by mouth 2 (two) times daily.  Marland Kitchen levothyroxine (SYNTHROID, LEVOTHROID) 25 MCG tablet Take 1 tablet (25 mcg total) by mouth daily.  Marland Kitchen loperamide (ANTI-DIARRHEAL) 2 MG capsule Take 4 mg by mouth daily.   Marland Kitchen losartan (COZAAR) 50 MG tablet Take 1 tablet (50 mg total) by mouth daily.  . Macitentan (OPSUMIT) 10 MG TABS Take 10 mg by mouth daily.  . Melatonin 5 MG TABS Take 10 mg by mouth at bedtime.  Marland Kitchen omeprazole (PRILOSEC) 40 MG capsule Take 1 capsule (40 mg total) by mouth daily.  . potassium chloride (K-DUR) 10 MEQ tablet Take 1 tablet (10 mEq total) by mouth daily.  . Selexipag (UPTRAVI) 1000 MCG TABS Take 1,000 mg by mouth every morning.  . Selexipag (UPTRAVI) 800 MCG TABS Take 1 tablet (800 mcg total) by mouth every evening.  Marland Kitchen spironolactone (ALDACTONE) 50 MG tablet Take 1 tablet (50 mg total) by mouth daily.  . Tadalafil, PAH, (ADCIRCA) 20 MG TABS TAKE 2 TABLETS (40 MG TOTAL) DAILY  . verapamil (VERELAN PM) 180 MG 24 hr capsule Take 1 capsule (180 mg total) by mouth at bedtime.   No current facility-administered medications on file prior to visit.     Chief Complaint  Patient presents with  . Follow-up    Pt reports turning CPAP machine back into [facility] bc of intolerance to mask. Review PFT. Recent abnormal cxr per patient (12/04/15)     Tests PFT 12/27/12 >> FEV1 2.06 (59%), FEV1% 89, TLC 4.50 (61%), DLCO 53%, no BD V/Q scan 12/29/12 >> low probability PE HST 03/06/15 >> AHI 12, SaO2 low 82% Echo 04/17/15  >> EF 65 to 31%, grade 1 diastolic dysfx, mod AI, PAS 64 mmHg ONO with CPAP and RA 07/29/15 >> Test time 3 hrs 31 min. Mean SpO2 93.3%, low SpO2 82%. Spent 4 min 40 sec with SpO2 < 88%. PFT 12/13/15 >> FEV1 2.05 (61%), FEV1% 83, TLC 4.21 (55%), DLCO 44%, no BD  Past medical hx HTN, CKD, GERD, Gout, Hypothyroidism, Seizures, Alcoholic cirrhosis, Leukopenia/Thrombocytopenia with splenomegaly  Past surgical hx, Allergies, Family hx, Social hx all reviewed.  Vital Signs BP 132/80 mmHg  Pulse 76  Ht _0  (1.854 m)  Wt 203 lb (92.08 kg)  BMI 26.79 kg/m2  SpO2 96%  History of Present Illness Darren Mueller is a 65 y.o. male former smoker with OSA, COPD with chronic bronchitis, alcoholic cirrhosis, and secondary pulmonary hypertension.  He stopped using CPAP.  He never got new mask.  He doesn't notice any difference w/o CPAP.  He had trouble with Lt sided chest pain in January.  He has CXR which showed ATX at Lt base.  He has improved since then.  He denies cough, wheeze, sputum, fever, or hemoptysis.  He has been using dulera two puffs in the AM.  He has not needed to use albuterol.  His PFT today shows combined obstructive and restrictive defect with no appreciable change from 2014 except for progression of  diffusion defect.   Physical Exam  General - No distress ENT - No sinus tenderness, no oral exudate, no LAN Cardiac - s1s2 regular, no murmur Chest - No wheeze/rales/dullness Back - No focal tenderness Abd - Soft, non-tender Ext - No edema Neuro - Normal strength Skin - No rashes Psych - normal mood, and behavior   Assessment/Plan  COPD with chronic bronchitis. Plan: - continue advair - prn proair - will arrange for PFT's, CXR to further assess  Pulmonary hypertension in setting of OSA, COPD, Alcoholic cirrhosis. Plan: - he is on macitentan, adcirca, and selexipag per cardiology - unable to tolerate CPAP >> will check ONO on RA  Atelectasis on CXR from  12/04/15. Plan: - repeat CXR >> if abnormality persists, then get CT chest    Patient Instructions  Chest xray today Will arrange for overnight oxygen test  Follow up in 6 months     Chesley Mires, MD [place] [facility] Pager:  [phone]

## 2015-12-17 ENCOUNTER — Ambulatory Visit (HOSPITAL_COMMUNITY)
Admission: RE | Admit: 2015-12-17 | Discharge: 2015-12-17 | Disposition: A | Discharge status: Home / Self Care | Payer: Medicare Other | Source: Ambulatory Visit | Attending: Internal Medicine | Admitting: Internal Medicine

## 2015-12-17 ENCOUNTER — Encounter (HOSPITAL_COMMUNITY): Payer: Self-pay | Admitting: Internal Medicine

## 2015-12-17 ENCOUNTER — Ambulatory Visit (HOSPITAL_BASED_OUTPATIENT_CLINIC_OR_DEPARTMENT_OTHER)
Admission: RE | Admit: 2015-12-17 | Discharge: 2015-12-17 | Disposition: A | Discharge status: Home / Self Care | Payer: Medicare Other | Source: Ambulatory Visit | Attending: Internal Medicine | Admitting: Internal Medicine

## 2015-12-17 ENCOUNTER — Telehealth (HOSPITAL_COMMUNITY): Payer: Self-pay | Admitting: *Deleted

## 2015-12-17 ENCOUNTER — Encounter (HOSPITAL_COMMUNITY): Payer: Self-pay | Admitting: *Deleted

## 2015-12-17 ENCOUNTER — Other Ambulatory Visit (HOSPITAL_COMMUNITY): Payer: Self-pay | Admitting: *Deleted

## 2015-12-17 ENCOUNTER — Telehealth: Payer: Self-pay | Admitting: Oncology

## 2015-12-17 VITALS — BP 144/80 | HR 83 | Wt 211.5 lb

## 2015-12-17 DIAGNOSIS — I313 Pericardial effusion (noninflammatory): Secondary | ICD-10-CM | POA: Insufficient documentation

## 2015-12-17 DIAGNOSIS — I272 Other secondary pulmonary hypertension: Secondary | ICD-10-CM

## 2015-12-17 DIAGNOSIS — D61818 Other pancytopenia: Secondary | ICD-10-CM

## 2015-12-17 DIAGNOSIS — I10 Essential (primary) hypertension: Secondary | ICD-10-CM | POA: Diagnosis not present

## 2015-12-17 DIAGNOSIS — N183 Chronic kidney disease, stage 3 unspecified: Secondary | ICD-10-CM

## 2015-12-17 DIAGNOSIS — I059 Rheumatic mitral valve disease, unspecified: Secondary | ICD-10-CM | POA: Diagnosis not present

## 2015-12-17 DIAGNOSIS — R9389 Abnormal findings on diagnostic imaging of other specified body structures: Secondary | ICD-10-CM

## 2015-12-17 DIAGNOSIS — IMO0002 Reserved for concepts with insufficient information to code with codable children: Secondary | ICD-10-CM

## 2015-12-17 DIAGNOSIS — R938 Abnormal findings on diagnostic imaging of other specified body structures: Secondary | ICD-10-CM

## 2015-12-17 DIAGNOSIS — I50810 Right heart failure, unspecified: Secondary | ICD-10-CM

## 2015-12-17 DIAGNOSIS — I5189 Other ill-defined heart diseases: Secondary | ICD-10-CM | POA: Insufficient documentation

## 2015-12-17 DIAGNOSIS — I071 Rheumatic tricuspid insufficiency: Secondary | ICD-10-CM | POA: Insufficient documentation

## 2015-12-17 DIAGNOSIS — I2729 Other secondary pulmonary hypertension: Secondary | ICD-10-CM

## 2015-12-17 DIAGNOSIS — I517 Cardiomegaly: Secondary | ICD-10-CM | POA: Insufficient documentation

## 2015-12-17 DIAGNOSIS — N189 Chronic kidney disease, unspecified: Secondary | ICD-10-CM

## 2015-12-17 DIAGNOSIS — F1029 Alcohol dependence with unspecified alcohol-induced disorder: Secondary | ICD-10-CM

## 2015-12-17 DIAGNOSIS — R0600 Dyspnea, unspecified: Secondary | ICD-10-CM | POA: Diagnosis not present

## 2015-12-17 DIAGNOSIS — I351 Nonrheumatic aortic (valve) insufficiency: Secondary | ICD-10-CM | POA: Insufficient documentation

## 2015-12-17 LAB — BASIC METABOLIC PANEL
ANION GAP: 14 (ref 5–15)
BUN: 23 mg/dL — ABNORMAL HIGH (ref 6–20)
CALCIUM: 9 mg/dL (ref 8.9–10.3)
CO2: 18 mmol/L — ABNORMAL LOW (ref 22–32)
CREATININE: 1.51 mg/dL — AB (ref 0.61–1.24)
Chloride: 105 mmol/L (ref 101–111)
GFR calc Af Amer: 54 mL/min — ABNORMAL LOW (ref >60)
GFR, EST NON AFRICAN AMERICAN: 47 mL/min — AB (ref >60)
GLUCOSE: 107 mg/dL — AB (ref 65–99)
Potassium: 4.2 mmol/L (ref 3.5–5.1)
Sodium: 137 mmol/L (ref 135–145)

## 2015-12-17 LAB — CBC
HCT: 39.8 % (ref 39.0–52.0)
HEMOGLOBIN: 13.1 g/dL (ref 13.0–17.0)
MCH: 35.2 pg — AB (ref 26.0–34.0)
MCHC: 32.9 g/dL (ref 30.0–36.0)
MCV: 107 fL — ABNORMAL HIGH (ref 78.0–100.0)
PLATELETS: 53 10*3/uL — AB (ref 150–400)
RBC: 3.72 MIL/uL — ABNORMAL LOW (ref 4.22–5.81)
RDW: 14.3 % (ref 11.5–15.5)
WBC: 2.1 10*3/uL — ABNORMAL LOW (ref 4.0–10.5)

## 2015-12-17 LAB — PROTIME-INR
INR: 1.25 (ref 0.00–1.49)
PROTHROMBIN TIME: 15.9 s — AB (ref 11.6–15.2)

## 2015-12-17 NOTE — Patient Instructions (Signed)
CT Scan of chest  You have been referred to Dr Alen Blew in Hematology  Please Keep your appointment with Dr Ardis Hughs   Right Heart Catheterization on Fri 2/17, see instruction sheet  Your physician recommends that you schedule a follow-up appointment in: 6-8 weeks

## 2015-12-17 NOTE — Telephone Encounter (Signed)
No pre cert reqd for right heart cath 2/17

## 2015-12-17 NOTE — Progress Notes (Signed)
Patient ID: Darren Mueller, male   DOB: Jun 15, 1951, 65 y.o.   MRN: [id number]  [facility] CLINIC NOTE  Patient ID: Darren Mueller, male   DOB: 21-Aug-1951, 65 y.o.   MRN: [id number] GI : Darren Mueller PCP: Darren Mueller  Subjective:    Darren Mueller ("the Darren Mueller") is a 65 yo with history of COPD, portopulmonary hypertension with RV failure, and ETOH cirrhosis presents for followup of his PAH.  He returns today for regular PAH follow up. Says he feels ok. Since we last saw him had PFTs (as below) and CXR which showed so mild right basilar atelectasis. He saw Darren Mueller who ordered ONOX. Hasn't done it yet. No longer going to [employer].  Continues with selexipag at1000 qam/ 800 qpm. Unable to tolerate any further uptitration with HAs and worsening flushing.  Remains on Macitentan and Adcirca. Hasn't had swelling or needed extra lasix. He is only taking lasix Monday and Friday.NYHA III symptoms. Continues to drink, at least 2 times a week, and cuts himself off at 2 drinks (Vodka tonics).  No lightheadedness or dizziness. Denies presyncope/syncope.  Has GI f/u pending.  Echo today LVEF 60-65% RV massively dilated. Flat septum. Severe HK. Moderate TR RVSP ~65. IVC small. No effusion    PAH meds 1) Macitnentan 10 2) Adcirca 10 3) Selexapeg  Studies:  ECHO 12/14 EF 55-60% Peak PA pressure 35. Severe RV dysfunction  ECHO 7/15 EF 60% RV moderately to severely dilated. Moderate HK RVSP 57mm HG  ECHO 6/16 EF 60% RV moderately to severely dilated. Severe HK RVSP ~65 mm HG. D-shaped septum  RHC 2/14: RA = 13  RV = 63/5/14  PA = 65/24 (41)  PCW = 6  Hepatic wedge = 21  Fick cardiac output/index = 3.8/1.8  PVR = 9.1  FA sat = 94%  PA sat = 58%, 56%  SVC = 54%  RHC 06/06/14 RA = 5  RV = 73/5/9  PA = 81/25 (46)  PCW = 7  Fick cardiac output/index = 6.0/2.8  PVR = 6.5 Woods  FA sat = 95%  PA sat = 71%, 67%  Unable to get hepatic wedge  PFTs 3/14 FEV1 2.02 L (58%) FVC 2.7L (54%) FEV1/FVC (89%) DLCO  53%  PFTs 1/17 FEV1 2.05 L (61%) FVC 2.48L (56%) DLCO 44%   Event Monitor - NSR 10/26/13.   Ab u/s 8/16: + cirrhosis/mild to moderate splenomegaly  6 min walk 01/10/13, 1290 feet  (01/10/14) = 1280 feet (380 m) (9/15) = 1350 feet (411 m) O2 sats ranged from 89-95% on room air, HR ranged from 100-129. (6/16) = 384 meters  (2/17) = 1250 feet (312m)  Labs:  10/19/13 Creatinine 1.79 K 4.1  3/15        Cr 1.78 K 4.2 03/17/14    CR 1.23 K 4.0 LFTs ok 06/06/14 K 4.0 Creatinine 1.4  08/08/14 K 4.0 Cr 1.6 09/12/14 K 4.3 Cr 1.8 LFTs ok, HCT 58 12/15 K 4.3 Cr 1.15  06/2015 K 3.6, Cr 1.5 1/17  K 3.7 Cr 1.29 AST 75 ALT 33 Albumin 4.0 Bili 1.4 Wbc 2.2 hgb 13.5 PLT 61k   Current Outpatient Prescriptions on File Prior to Encounter  Medication Sig Dispense Refill  . albuterol (PROVENTIL HFA;VENTOLIN HFA) 108 (90 BASE) MCG/ACT inhaler Inhale 2 puffs into the lungs every 6 (six) hours as needed for wheezing or shortness of breath.    . Fluticasone Propionate 0.05 % LOTN Apply daily to affected area  3 Bottle 3  . furosemide (LASIX) 40 MG tablet Take 40 mg by mouth 2 (two) times a week. Take on Monday and Thursday    . hyoscyamine (SYMAX-SR) 0.375 MG 12 hr tablet Take 1 tablet (0.375 mg total) by mouth 2 (two) times daily. 180 tablet 0  . levothyroxine (SYNTHROID, LEVOTHROID) 25 MCG tablet Take 1 tablet (25 mcg total) by mouth daily. 90 tablet 1  . loperamide (ANTI-DIARRHEAL) 2 MG capsule Take 4 mg by mouth daily.     Marland Kitchen losartan (COZAAR) 50 MG tablet Take 1 tablet (50 mg total) by mouth daily. 90 tablet 3  . Macitentan (OPSUMIT) 10 MG TABS Take 10 mg by mouth daily. 90 tablet 3  . Melatonin 5 MG TABS Take 10 mg by mouth at bedtime.    . mometasone-formoterol (DULERA) 100-5 MCG/ACT AERO Inhale 2 puffs into the lungs 2 (two) times daily.    Marland Kitchen omeprazole (PRILOSEC) 40 MG capsule Take 1 capsule (40 mg total) by mouth daily. 90 capsule 2  . potassium chloride (K-DUR) 10 MEQ tablet Take 1  tablet (10 mEq total) by mouth daily. 360 tablet 3  . Selexipag (UPTRAVI) 1000 MCG TABS Take 1,000 mg by mouth every morning. 90 tablet 2  . Selexipag (UPTRAVI) 800 MCG TABS Take 1 tablet (800 mcg total) by mouth every evening. 90 tablet 2  . spironolactone (ALDACTONE) 50 MG tablet Take 1 tablet (50 mg total) by mouth daily. 90 tablet 1  . Tadalafil, PAH, (ADCIRCA) 20 MG TABS TAKE 2 TABLETS (40 MG TOTAL) DAILY 180 tablet 2  . verapamil (VERELAN PM) 180 MG 24 hr capsule Take 1 capsule (180 mg total) by mouth at bedtime. 90 capsule 1   No current facility-administered medications on file prior to encounter.    Objective:   Weight Range:  Vital Signs:   Pulse Rate:  [83] 83 (02/13 1015) BP: (144)/(80) 144/80 mmHg (02/13 1015) SpO2:  [100 %] 100 % (02/13 1015) Weight:  [211 lb 8 oz (95.936 kg)] 211 lb 8 oz (95.936 kg) (02/13 1015)    Wt Readings from Last 3 Encounters:  12/17/15 211 lb 8 oz (95.936 kg)  12/13/15 203 lb (92.08 kg)  12/04/15 207 lb (93.895 kg)     Physical Exam: General: NAD, facial flushing.  Neck: JVP ~6-7  cm with very mild HJR, no thyromegaly or thyroid nodule.  Lungs: CTAB, normal effort. CV: Nondisplaced PMI. Heart regular  2/6 TR P2 LLSB. No carotid bruit.   Abdomen: Soft, NT, ND, no HSM. No bruits or masses. +BS  Ext: warm, No clubbing, no edema.Normal pedal pulses. Skin: Intact without lesions or rashes.  Neurologic: Alert and oriented x 3.  Psych: Normal affect.  Extremities: No clubbing or cyanosis.  HEENT: Normal. Except for erythematous rash/flushing   Assessment/Plan    1. PAH: Suspected portopulmonary HTN. - NYHA III symptoms on triple therapy  - Continue macitentan 10 daily, adcirca 40 mg daily, selexipag 1000 qam/800 qpm unable to titrate further - Echo reviewed today with him and he has ongoing severe RV strain with RVSp ~40mmHG. Will arrange repeat RHC, Get today -> 1250 ft (381 m) - Long talk about absolute need to stop ETOH  completely.  - Volume status ok 2. CKD:  - Check BMET/BNP today. Has been stable at previous checks.  3. Cirrhosis:  - Likely combination of RV failure an ETOH - As above, again recommended complete cessation of ETOH.  - Albumin 4.0 but AST remains elevated  and platelets low. Have referred to GI for any additional recommendations. - No evidence of hepatic encephalopathy 4. HTN:  - Stable on current regimen.  5. OSA:  -Has mild OSA with AHI 12 and desats down to 82%.  - Intolerate CPAP.  - Have encouraged to use a pulse ox and use 02 prn to keep sats > 90% 6. Pancytopenia:  - Progressive. Most likely due to cirrhosis. Has seen Darren. Myna Hidalgo in Hematology without clear recommendations. Will ask Darren. Clelia Croft for second opinion. 7. Abnormal CXR - Will proceed with CT scan chest to further evaluate.   Total time spent 40 minutes. Over half that time spent discussing above.    Mazi Schuff,MD 10:22 AM

## 2015-12-17 NOTE — Telephone Encounter (Signed)
Spoke with pt regarding referral and schedule appt for 2/21 with Dr. Alen Blew.  As I was closing up appt I notice pt had seen Dr. Marin Olp, I spoke with his office and made them aware of the appt and the referral has come in requesting Dr. Alen Blew.  Currently waiting to hear back from Dr. Marin Olp before processing patient.

## 2015-12-17 NOTE — Progress Notes (Signed)
  Echocardiogram 2D Echocardiogram has been performed.  Bobbye Charleston 12/17/2015, 10:12 AM

## 2015-12-17 NOTE — Progress Notes (Signed)
Pt completed 6MW test, pt ambulated 1250 ft (116 m)

## 2015-12-17 NOTE — Telephone Encounter (Signed)
Dr Marin Olp wants to continue tx of pt and appt here with Alen Blew has been cancelled. However pt insisted on keeping appt with Dr. Alen Blew.  I called back to [place] and informed the office of pt's wishes.  The appt was scheduled with [facility]

## 2015-12-21 ENCOUNTER — Encounter (HOSPITAL_COMMUNITY): Payer: Self-pay | Admitting: *Deleted

## 2015-12-21 ENCOUNTER — Encounter (HOSPITAL_COMMUNITY)
Admission: RE | Disposition: A | Discharge status: Home / Self Care | Payer: Self-pay | Source: Ambulatory Visit | Attending: Internal Medicine

## 2015-12-21 ENCOUNTER — Ambulatory Visit (HOSPITAL_COMMUNITY)
Admission: RE | Admit: 2015-12-21 | Discharge: 2015-12-21 | Disposition: A | Discharge status: Home / Self Care | Payer: Medicare Other | Source: Ambulatory Visit | Attending: Internal Medicine | Admitting: Internal Medicine

## 2015-12-21 DIAGNOSIS — I129 Hypertensive chronic kidney disease with stage 1 through stage 4 chronic kidney disease, or unspecified chronic kidney disease: Secondary | ICD-10-CM | POA: Insufficient documentation

## 2015-12-21 DIAGNOSIS — K766 Portal hypertension: Secondary | ICD-10-CM | POA: Insufficient documentation

## 2015-12-21 DIAGNOSIS — N189 Chronic kidney disease, unspecified: Secondary | ICD-10-CM | POA: Diagnosis not present

## 2015-12-21 DIAGNOSIS — J449 Chronic obstructive pulmonary disease, unspecified: Secondary | ICD-10-CM | POA: Diagnosis not present

## 2015-12-21 DIAGNOSIS — I272 Other secondary pulmonary hypertension: Secondary | ICD-10-CM | POA: Diagnosis not present

## 2015-12-21 DIAGNOSIS — D61818 Other pancytopenia: Secondary | ICD-10-CM | POA: Diagnosis not present

## 2015-12-21 DIAGNOSIS — R161 Splenomegaly, not elsewhere classified: Secondary | ICD-10-CM | POA: Insufficient documentation

## 2015-12-21 DIAGNOSIS — G4733 Obstructive sleep apnea (adult) (pediatric): Secondary | ICD-10-CM | POA: Diagnosis not present

## 2015-12-21 DIAGNOSIS — K7030 Alcoholic cirrhosis of liver without ascites: Secondary | ICD-10-CM | POA: Insufficient documentation

## 2015-12-21 HISTORY — PX: CARDIAC CATHETERIZATION: SHX172

## 2015-12-21 LAB — POCT I-STAT 3, VENOUS BLOOD GAS (G3P V)
ACID-BASE DEFICIT: 5 mmol/L — AB (ref 0.0–2.0)
ACID-BASE DEFICIT: 5 mmol/L — AB (ref 0.0–2.0)
Acid-base deficit: 5 mmol/L — ABNORMAL HIGH (ref 0.0–2.0)
BICARBONATE: 19.3 meq/L — AB (ref 20.0–24.0)
Bicarbonate: 18.9 mEq/L — ABNORMAL LOW (ref 20.0–24.0)
Bicarbonate: 19.6 mEq/L — ABNORMAL LOW (ref 20.0–24.0)
O2 SAT: 65 %
O2 SAT: 65 %
O2 SAT: 68 %
PCO2 VEN: 33.6 mmHg — AB (ref 45.0–50.0)
PH VEN: 7.38 — AB (ref 7.250–7.300)
PO2 VEN: 34 mmHg (ref 30.0–45.0)
PO2 VEN: 35 mmHg (ref 30.0–45.0)
TCO2: 20 mmol/L (ref 0–100)
TCO2: 20 mmol/L (ref 0–100)
TCO2: 21 mmol/L (ref 0–100)
pCO2, Ven: 31 mmHg — ABNORMAL LOW (ref 45.0–50.0)
pCO2, Ven: 32.6 mmHg — ABNORMAL LOW (ref 45.0–50.0)
pH, Ven: 7.374 — ABNORMAL HIGH (ref 7.250–7.300)
pH, Ven: 7.395 — ABNORMAL HIGH (ref 7.250–7.300)
pO2, Ven: 34 mmHg (ref 30.0–45.0)

## 2015-12-21 SURGERY — RIGHT HEART CATH

## 2015-12-21 MED ORDER — FENTANYL CITRATE (PF) 100 MCG/2ML IJ SOLN
INTRAMUSCULAR | Status: DC | PRN
  Administered 2015-12-21 (×2): 25 ug via INTRAVENOUS

## 2015-12-21 MED ORDER — LIDOCAINE HCL (PF) 1 % IJ SOLN
INTRAMUSCULAR | Status: AC
  Filled 2015-12-21: qty 30

## 2015-12-21 MED ORDER — SODIUM CHLORIDE 0.9 % IV SOLN: 250.0000 mL | INTRAVENOUS | Status: DC | PRN

## 2015-12-21 MED ORDER — MIDAZOLAM HCL 2 MG/2ML IJ SOLN
INTRAMUSCULAR | Status: AC
  Filled 2015-12-21: qty 2

## 2015-12-21 MED ORDER — ASPIRIN 81 MG PO CHEW: 81.0000 mg | CHEWABLE_TABLET | ORAL | Status: DC

## 2015-12-21 MED ORDER — SODIUM CHLORIDE 0.9 % IV SOLN
INTRAVENOUS | Status: DC
  Administered 2015-12-21: 09:00:00 via INTRAVENOUS

## 2015-12-21 MED ORDER — SODIUM CHLORIDE 0.9% FLUSH: 3.0000 mL | INTRAVENOUS | Status: DC | PRN

## 2015-12-21 MED ORDER — HEPARIN (PORCINE) IN NACL 2-0.9 UNIT/ML-% IJ SOLN
INTRAMUSCULAR | Status: AC
  Filled 2015-12-21: qty 1000

## 2015-12-21 MED ORDER — LIDOCAINE HCL (PF) 1 % IJ SOLN
INTRAMUSCULAR | Status: DC | PRN
  Administered 2015-12-21: 5 mL

## 2015-12-21 MED ORDER — ONDANSETRON HCL 4 MG/2ML IJ SOLN: 4.0000 mg | Freq: Four times a day (QID) | INTRAMUSCULAR | Status: DC | PRN

## 2015-12-21 MED ORDER — SODIUM CHLORIDE 0.9% FLUSH: 3.0000 mL | Freq: Two times a day (BID) | INTRAVENOUS | Status: DC

## 2015-12-21 MED ORDER — MIDAZOLAM HCL 2 MG/2ML IJ SOLN
INTRAMUSCULAR | Status: DC | PRN
  Administered 2015-12-21: 1 mg via INTRAVENOUS
  Administered 2015-12-21: 2 mg via INTRAVENOUS

## 2015-12-21 MED ORDER — FENTANYL CITRATE (PF) 100 MCG/2ML IJ SOLN
INTRAMUSCULAR | Status: AC
  Filled 2015-12-21: qty 2

## 2015-12-21 MED ORDER — ACETAMINOPHEN 325 MG PO TABS: 650.0000 mg | ORAL_TABLET | ORAL | Status: DC | PRN

## 2015-12-21 SURGICAL SUPPLY — 8 items
CATH BALLN WEDGE 5F 110CM (CATHETERS) ×2 IMPLANT
KIT HEART RIGHT NAMIC (KITS) ×2 IMPLANT
PACK CARDIAC CATHETERIZATION (CUSTOM PROCEDURE TRAY) ×2 IMPLANT
PROTECTION STATION PRESSURIZED (MISCELLANEOUS) ×2
SHEATH FAST CATH BRACH 5F 5CM (SHEATH) ×2 IMPLANT
STATION PROTECTION PRESSURIZED (MISCELLANEOUS) ×1 IMPLANT
TRANSDUCER W/STOPCOCK (MISCELLANEOUS) ×2 IMPLANT
TUBING CIL FLEX 10 FLL-RA (TUBING) ×2 IMPLANT

## 2015-12-21 NOTE — H&P (View-Only) (Signed)
Patient ID: Darren Mueller, male   DOB: Jun 15, 1951, 65 y.o.   MRN: [id number]  [facility] CLINIC NOTE  Patient ID: Darren Mueller, male   DOB: 21-Aug-1951, 65 y.o.   MRN: [id number] GI : Dr Christella Hartigan PCP: Dr Abner Greenspan  Subjective:    Darren Mueller ("Darren Mueller") is a 65 yo with history of COPD, portopulmonary hypertension with RV failure, and ETOH cirrhosis presents for followup of his PAH.  He returns today for regular PAH follow up. Says he feels ok. Since we last saw him had PFTs (as below) and CXR which showed so mild right basilar atelectasis. He saw Dr. Craige Cotta who ordered ONOX. Hasn't done it yet. No longer going to [employer].  Continues with selexipag at1000 qam/ 800 qpm. Unable to tolerate any further uptitration with HAs and worsening flushing.  Remains on Macitentan and Adcirca. Hasn't had swelling or needed extra lasix. He is only taking lasix Monday and Friday.NYHA III symptoms. Continues to drink, at least 2 times a week, and cuts himself off at 2 drinks (Vodka tonics).  No lightheadedness or dizziness. Denies presyncope/syncope.  Has GI f/u pending.  Echo today LVEF 60-65% RV massively dilated. Flat septum. Severe HK. Moderate TR RVSP ~65. IVC small. No effusion    PAH meds 1) Macitnentan 10 2) Adcirca 10 3) Selexapeg  Studies:  ECHO 12/14 EF 55-60% Peak PA pressure 35. Severe RV dysfunction  ECHO 7/15 EF 60% RV moderately to severely dilated. Moderate HK RVSP 57mm HG  ECHO 6/16 EF 60% RV moderately to severely dilated. Severe HK RVSP ~65 mm HG. D-shaped septum  RHC 2/14: RA = 13  RV = 63/5/14  PA = 65/24 (41)  PCW = 6  Hepatic wedge = 21  Fick cardiac output/index = 3.8/1.8  PVR = 9.1  FA sat = 94%  PA sat = 58%, 56%  SVC = 54%  RHC 06/06/14 RA = 5  RV = 73/5/9  PA = 81/25 (46)  PCW = 7  Fick cardiac output/index = 6.0/2.8  PVR = 6.5 Woods  FA sat = 95%  PA sat = 71%, 67%  Unable to get hepatic wedge  PFTs 3/14 FEV1 2.02 L (58%) FVC 2.7L (54%) FEV1/FVC (89%) DLCO  53%  PFTs 1/17 FEV1 2.05 L (61%) FVC 2.48L (56%) DLCO 44%   Event Monitor - NSR 10/26/13.   Ab u/s 8/16: + cirrhosis/mild to moderate splenomegaly  6 min walk 01/10/13, 1290 feet  (01/10/14) = 1280 feet (380 m) (9/15) = 1350 feet (411 m) O2 sats ranged from 89-95% on room air, HR ranged from 100-129. (6/16) = 384 meters  (2/17) = 1250 feet (312m)  Labs:  10/19/13 Creatinine 1.79 K 4.1  3/15        Cr 1.78 K 4.2 03/17/14    CR 1.23 K 4.0 LFTs ok 06/06/14 K 4.0 Creatinine 1.4  08/08/14 K 4.0 Cr 1.6 09/12/14 K 4.3 Cr 1.8 LFTs ok, HCT 58 12/15 K 4.3 Cr 1.15  06/2015 K 3.6, Cr 1.5 1/17  K 3.7 Cr 1.29 AST 75 ALT 33 Albumin 4.0 Bili 1.4 Wbc 2.2 hgb 13.5 PLT 61k   Current Outpatient Prescriptions on File Prior to Encounter  Medication Sig Dispense Refill  . albuterol (PROVENTIL HFA;VENTOLIN HFA) 108 (90 BASE) MCG/ACT inhaler Inhale 2 puffs into Darren lungs every 6 (six) hours as needed for wheezing or shortness of breath.    . Fluticasone Propionate 0.05 % LOTN Apply daily to affected area  3 Bottle 3  . furosemide (LASIX) 40 MG tablet Take 40 mg by mouth 2 (two) times a week. Take on Monday and Thursday    . hyoscyamine (SYMAX-SR) 0.375 MG 12 hr tablet Take 1 tablet (0.375 mg total) by mouth 2 (two) times daily. 180 tablet 0  . levothyroxine (SYNTHROID, LEVOTHROID) 25 MCG tablet Take 1 tablet (25 mcg total) by mouth daily. 90 tablet 1  . loperamide (ANTI-DIARRHEAL) 2 MG capsule Take 4 mg by mouth daily.     Marland Kitchen losartan (COZAAR) 50 MG tablet Take 1 tablet (50 mg total) by mouth daily. 90 tablet 3  . Macitentan (OPSUMIT) 10 MG TABS Take 10 mg by mouth daily. 90 tablet 3  . Melatonin 5 MG TABS Take 10 mg by mouth at bedtime.    . mometasone-formoterol (DULERA) 100-5 MCG/ACT AERO Inhale 2 puffs into Darren lungs 2 (two) times daily.    Marland Kitchen omeprazole (PRILOSEC) 40 MG capsule Take 1 capsule (40 mg total) by mouth daily. 90 capsule 2  . potassium chloride (K-DUR) 10 MEQ tablet Take 1  tablet (10 mEq total) by mouth daily. 360 tablet 3  . Selexipag (UPTRAVI) 1000 MCG TABS Take 1,000 mg by mouth every morning. 90 tablet 2  . Selexipag (UPTRAVI) 800 MCG TABS Take 1 tablet (800 mcg total) by mouth every evening. 90 tablet 2  . spironolactone (ALDACTONE) 50 MG tablet Take 1 tablet (50 mg total) by mouth daily. 90 tablet 1  . Tadalafil, PAH, (ADCIRCA) 20 MG TABS TAKE 2 TABLETS (40 MG TOTAL) DAILY 180 tablet 2  . verapamil (VERELAN PM) 180 MG 24 hr capsule Take 1 capsule (180 mg total) by mouth at bedtime. 90 capsule 1   No current facility-administered medications on file prior to encounter.    Objective:   Weight Range:  Vital Signs:   Pulse Rate:  [83] 83 (02/13 1015) BP: (144)/(80) 144/80 mmHg (02/13 1015) SpO2:  [100 %] 100 % (02/13 1015) Weight:  [211 lb 8 oz (95.936 kg)] 211 lb 8 oz (95.936 kg) (02/13 1015)    Wt Readings from Last 3 Encounters:  12/17/15 211 lb 8 oz (95.936 kg)  12/13/15 203 lb (92.08 kg)  12/04/15 207 lb (93.895 kg)     Physical Exam: General: NAD, facial flushing.  Neck: JVP ~6-7  cm with very mild HJR, no thyromegaly or thyroid nodule.  Lungs: CTAB, normal effort. CV: Nondisplaced PMI. Heart regular  2/6 TR P2 LLSB. No carotid bruit.   Abdomen: Soft, NT, ND, no HSM. No bruits or masses. +BS  Ext: warm, No clubbing, no edema.Normal pedal pulses. Skin: Intact without lesions or rashes.  Neurologic: Alert and oriented x 3.  Psych: Normal affect.  Extremities: No clubbing or cyanosis.  HEENT: Normal. Except for erythematous rash/flushing   Assessment/Plan    1. PAH: Suspected portopulmonary HTN. - NYHA III symptoms on triple therapy  - Continue macitentan 10 daily, adcirca 40 mg daily, selexipag 1000 qam/800 qpm unable to titrate further - Echo reviewed today with him and he has ongoing severe RV strain with RVSp ~40mmHG. Will arrange repeat RHC, Get today -> 1250 ft (381 m) - Long talk about absolute need to stop ETOH  completely.  - Volume status ok 2. CKD:  - Check BMET/BNP today. Has been stable at previous checks.  3. Cirrhosis:  - Likely combination of RV failure an ETOH - As above, again recommended complete cessation of ETOH.  - Albumin 4.0 but AST remains elevated  and platelets low. Have referred to GI for any additional recommendations. - No evidence of hepatic encephalopathy 4. HTN:  - Stable on current regimen.  5. OSA:  -Has mild OSA with AHI 12 and desats down to 82%.  - Intolerate CPAP.  - Have encouraged to use a pulse ox and use 02 prn to keep sats > 90% 6. Pancytopenia:  - Progressive. Most likely due to cirrhosis. Has seen Dr. Myna Hidalgo in Hematology without clear recommendations. Will ask Dr. Clelia Croft for second opinion. 7. Abnormal CXR - Will proceed with CT scan chest to further evaluate.   Total time spent 40 minutes. Over half that time spent discussing above.    Mazi Schuff,MD 10:22 AM

## 2015-12-21 NOTE — Discharge Instructions (Signed)
Angiogram, Care After Refer to this sheet in the next few weeks. These instructions provide you with information about caring for yourself after your procedure. Your health care provider may also give you more specific instructions. Your treatment has been planned according to current medical practices, but problems sometimes occur. Call your health care provider if you have any problems or questions after your procedure. WHAT TO EXPECT AFTER THE PROCEDURE After your procedure, it is typical to have the following:  Bruising at the catheter insertion site that usually fades within 1-2 weeks.  Blood collecting in the tissue (hematoma) that may be painful to the touch. It should usually decrease in size and tenderness within 1-2 weeks. HOME CARE INSTRUCTIONS  Take medicines only as directed by your health care provider.  You may shower 24-48 hours after the procedure or as directed by your health care provider. Remove the bandage (dressing) and gently wash the site with plain soap and water. Pat the area dry with a clean towel. Do not rub the site, because this may cause bleeding.  Do not take baths, swim, or use a hot tub until your health care provider approves.  Check your insertion site every day for redness, swelling, or drainage.  Do not apply powder or lotion to the site.  Do not lift over 10 lb (4.5 kg) for 5 days after your procedure or as directed by your health care provider.  Ask your health care provider when it is okay to:  Return to work or school.  Resume usual physical activities or sports.  Resume sexual activity.  Do not drive home if you are discharged the same day as the procedure. Have someone else drive you.  You may drive 24 hours after the procedure unless otherwise instructed by your health care provider.  Do not operate machinery or power tools for 24 hours after the procedure or as directed by your health care provider.  If your procedure was done as an  outpatient procedure, which means that you went home the same day as your procedure, a responsible adult should be with you for the first 24 hours after you arrive home.  Keep all follow-up visits as directed by your health care provider. This is important. SEEK MEDICAL CARE IF:  You have a fever.  You have chills.  You have increased bleeding from the catheter insertion site. Hold pressure on the site. SEEK IMMEDIATE MEDICAL CARE IF:  You have unusual pain at the catheter insertion site.  You have redness, warmth, or swelling at the catheter insertion site.  You have drainage (other than a small amount of blood on the dressing) from the catheter insertion site.  The catheter insertion site is bleeding, and the bleeding does not stop after 30 minutes of holding steady pressure on the site.  The area near or just beyond the catheter insertion site becomes pale, cool, tingly, or numb.   This information is not intended to replace advice given to you by your health care provider. Make sure you discuss any questions you have with your health care provider.   Document Released: 05/08/2005 Document Revised: 11/10/2014 Document Reviewed: 03/23/2013 Elsevier Interactive Patient Education [employer].

## 2015-12-21 NOTE — Interval H&P Note (Signed)
History and Physical Interval Note:  12/21/2015 10:34 AM  Darren Mueller  has presented today for surgery, with the diagnosis of pulmonary HTN The various methods of treatment have been discussed with the patient and family. After consideration of risks, benefits and other options for treatment, the patient has consented to  Procedure(s): Right Heart Cath (N/A) as a surgical intervention .  The patient's history has been reviewed, patient examined, no change in status, stable for surgery.  I have reviewed the patient's chart and labs.  Questions were answered to the patient's satisfaction.     Bensimhon, [name]

## 2015-12-24 ENCOUNTER — Ambulatory Visit (HOSPITAL_COMMUNITY)
Admission: RE | Admit: 2015-12-24 | Discharge: 2015-12-24 | Disposition: A | Discharge status: Home / Self Care | Payer: Medicare Other | Source: Ambulatory Visit | Attending: Internal Medicine | Admitting: Internal Medicine

## 2015-12-24 DIAGNOSIS — I517 Cardiomegaly: Secondary | ICD-10-CM | POA: Diagnosis not present

## 2015-12-24 DIAGNOSIS — R161 Splenomegaly, not elsewhere classified: Secondary | ICD-10-CM | POA: Diagnosis not present

## 2015-12-24 DIAGNOSIS — K7460 Unspecified cirrhosis of liver: Secondary | ICD-10-CM | POA: Diagnosis not present

## 2015-12-24 DIAGNOSIS — R911 Solitary pulmonary nodule: Secondary | ICD-10-CM | POA: Diagnosis not present

## 2015-12-24 DIAGNOSIS — I700 Atherosclerosis of aorta: Secondary | ICD-10-CM | POA: Diagnosis not present

## 2015-12-24 DIAGNOSIS — R938 Abnormal findings on diagnostic imaging of other specified body structures: Secondary | ICD-10-CM | POA: Diagnosis not present

## 2015-12-24 DIAGNOSIS — J984 Other disorders of lung: Secondary | ICD-10-CM | POA: Diagnosis not present

## 2015-12-24 DIAGNOSIS — R918 Other nonspecific abnormal finding of lung field: Secondary | ICD-10-CM | POA: Diagnosis not present

## 2015-12-24 DIAGNOSIS — R9389 Abnormal findings on diagnostic imaging of other specified body structures: Secondary | ICD-10-CM

## 2015-12-24 MED FILL — Heparin Sodium (Porcine) 2 Unit/ML in Sodium Chloride 0.9%: INTRAMUSCULAR | Qty: 1000 | Status: AC

## 2015-12-24 MED FILL — Nitroglycerin IV Soln 100 MCG/ML in D5W: INTRA_ARTERIAL | Qty: 10 | Status: AC

## 2015-12-24 MED FILL — Heparin Sodium (Porcine) Inj 1000 Unit/ML: INTRAMUSCULAR | Qty: 30 | Status: AC

## 2015-12-24 MED FILL — Verapamil HCl IV Soln 2.5 MG/ML: INTRAVENOUS | Qty: 2 | Status: AC

## 2015-12-24 MED FILL — Heparin Sodium (Porcine) Inj 1000 Unit/ML: INTRAMUSCULAR | Qty: 10 | Status: AC

## 2015-12-25 ENCOUNTER — Ambulatory Visit: Payer: Medicare Other | Admitting: Oncology

## 2015-12-25 ENCOUNTER — Ambulatory Visit (HOSPITAL_BASED_OUTPATIENT_CLINIC_OR_DEPARTMENT_OTHER): Payer: Medicare Other | Admitting: Oncology

## 2015-12-25 ENCOUNTER — Telehealth: Payer: Self-pay | Admitting: Oncology

## 2015-12-25 VITALS — BP 141/81 | HR 88 | Temp 97.6°F | Resp 18 | Ht 73.0 in | Wt 206.8 lb

## 2015-12-25 DIAGNOSIS — D696 Thrombocytopenia, unspecified: Secondary | ICD-10-CM | POA: Diagnosis not present

## 2015-12-25 DIAGNOSIS — D72819 Decreased white blood cell count, unspecified: Secondary | ICD-10-CM

## 2015-12-25 DIAGNOSIS — K7030 Alcoholic cirrhosis of liver without ascites: Secondary | ICD-10-CM | POA: Diagnosis not present

## 2015-12-25 DIAGNOSIS — R161 Splenomegaly, not elsewhere classified: Secondary | ICD-10-CM

## 2015-12-25 DIAGNOSIS — K766 Portal hypertension: Secondary | ICD-10-CM

## 2015-12-25 NOTE — Consult Note (Signed)
Reason for Referral: Thrombocytopenia and leukocytopenia.   HPI: 65 year old gentleman currently of [place] where he lived the majority of his life. He is currently retired but served in [employer] as well as in the [employer] He has history of hypertension, pulmonary hypertension and cirrhosis of the liver. His liver disease is related to heavy alcohol use that dates back for many years. He reports being heavy drinker drinking multiple drinks a day every day during his younger days. He still drinks periodically and now admits to drinking 3-4 days a week predominantly beer. His most recent CBC on 12/17/2015 showed a hemoglobin of 13.1 and a white cell count of 2.1 and platelet count of 53. He is alert function test all within normal range. His thrombocytopenia dates back at least 5 years with platelet count between 80 200,000. His white cell count have been noted to be low in January 2014 with a white cell count 3.1. His differential was normal or throughout this period. Ultrasound of the abdomen done on August 2016 shows cirrhosis of the liver as well as moderate splenomegaly. He was seen by Dr. Marin Olp in the past for the same issue in August 2016. Since that time, he had not reported any major changes in his health. He did undergo a right heart catheterization without any bleeding complications. He denies any epistaxis, hematochezia or melena. He continues to perform activities of daily living without any decline.  He does not report any headaches, blurry vision, syncope or seizures. He does not report any fevers, chills or sweats. He does not report any cough, wheezing or hemoptysis. He does not report any chest pain, palpitation or orthopnea. Does not report any nausea, vomiting, abdominal pain, or early satiety. He does not report any constipation or diarrhea. He does not report any frequency urgency or hesitancy. He does not report any skeletal complaints. Remaining review of systems  unremarkable.   Past Medical History  Diagnosis Date  . Hypertension   . Seizures ([place])   . Hx of colonic polyps   . GERD (gastroesophageal reflux disease)   . Gout   . Alcoholic cirrhosis of liver with ascites ([place]) 2014  . Unspecified hypothyroidism 01/25/2013  . Unspecified pleural effusion   . Other chronic pulmonary heart diseases   . Hypopotassemia   . Renal insufficiency 07/18/2013  . Diarrhea 09/04/2013  . Otalgia 11/28/2013  . Dermatitis 06/10/2015  . Alcohol dependence in remission ([place]) 03/22/2011    In remission   . Alcohol dependence ([place]) 03/22/2011    In remission   :  Past Surgical History  Procedure Laterality Date  . Colonoscopy N/A 12/08/2013    Procedure: COLONOSCOPY;  Surgeon: Milus Banister, MD;  Location: [facility];  Service: Endoscopy;  Laterality: N/A;  . Right heart catheterization Right 06/06/2014    Procedure: RIGHT HEART CATH;  Surgeon: Jolaine Artist, MD;  Location: [facility] CATH LAB;  Service: Cardiovascular;  Laterality: Right;  . Uvulopalatopharyngoplasty  1999  . Cardiac catheterization N/A 12/21/2015    Procedure: Right Heart Cath;  Surgeon: Jolaine Artist, MD;  Location: [facility] CV LAB;  Service: Cardiovascular;  Laterality: N/A;  :   Current outpatient prescriptions:  .  albuterol (PROVENTIL HFA;VENTOLIN HFA) 108 (90 BASE) MCG/ACT inhaler, Inhale 2 puffs into the lungs every 6 (six) hours as needed for wheezing or shortness of breath., Disp: , Rfl:  .  Fluticasone Propionate 0.05 % LOTN, Apply daily to affected area, Disp: 3 Bottle, Rfl: 3 .  furosemide (LASIX) 40 MG tablet, Take 40 mg by mouth 2 (two) times a week. Take on Monday and Thursday, Disp: , Rfl:  .  hyoscyamine (SYMAX-SR) 0.375 MG 12 hr tablet, Take 1 tablet (0.375 mg total) by mouth 2 (two) times daily., Disp: 180 tablet, Rfl: 0 .  levothyroxine (SYNTHROID, LEVOTHROID) 25 MCG tablet, Take 1 tablet (25 mcg total) by mouth daily., Disp: 90 tablet, Rfl: 1 .  loperamide  (ANTI-DIARRHEAL) 2 MG capsule, Take 4 mg by mouth daily. , Disp: , Rfl:  .  losartan (COZAAR) 50 MG tablet, Take 1 tablet (50 mg total) by mouth daily., Disp: 90 tablet, Rfl: 3 .  Macitentan (OPSUMIT) 10 MG TABS, Take 10 mg by mouth daily., Disp: 90 tablet, Rfl: 3 .  Melatonin 5 MG TABS, Take 10 mg by mouth at bedtime., Disp: , Rfl:  .  mometasone-formoterol (DULERA) 100-5 MCG/ACT AERO, Inhale 2 puffs into the lungs 2 (two) times daily., Disp: , Rfl:  .  omeprazole (PRILOSEC) 40 MG capsule, Take 1 capsule (40 mg total) by mouth daily., Disp: 90 capsule, Rfl: 2 .  potassium chloride (K-DUR) 10 MEQ tablet, Take 1 tablet (10 mEq total) by mouth daily., Disp: 360 tablet, Rfl: 3 .  Selexipag (UPTRAVI) 1000 MCG TABS, Take 1,000 mg by mouth every morning., Disp: 90 tablet, Rfl: 2 .  Selexipag (UPTRAVI) 800 MCG TABS, Take 1 tablet (800 mcg total) by mouth every evening., Disp: 90 tablet, Rfl: 2 .  spironolactone (ALDACTONE) 50 MG tablet, Take 1 tablet (50 mg total) by mouth daily., Disp: 90 tablet, Rfl: 1 .  Tadalafil, PAH, (ADCIRCA) 20 MG TABS, TAKE 2 TABLETS (40 MG TOTAL) DAILY, Disp: 180 tablet, Rfl: 2 .  verapamil (VERELAN PM) 180 MG 24 hr capsule, Take 1 capsule (180 mg total) by mouth at bedtime., Disp: 90 capsule, Rfl: 1:  Allergies  Allergen Reactions  . Aspirin Other (See Comments)    hypertension  :  Family History  Problem Relation Age of Onset  . Heart disease Mother   . Prostate cancer Father   . Colon cancer Neg Hx   . Heart attack Mother   . Hypertension Mother   :  Social History   Social History  . Marital Status: Divorced    Spouse Name: N/A  . Number of Children: 3  . Years of Education: N/A   Occupational History  . Retired     Social research officer, government   Social History Main Topics  . Smoking status: Former Smoker -- 2.00 packs/day for 5 years    Types: Cigarettes    Quit date: 09/22/1979  . Smokeless tobacco: Never Used  . Alcohol Use: 0.0 oz/week    0 Standard drinks or  equivalent per week     Comment: 2 vodka tonic mon, wed, friday  . Drug Use: No  . Sexual Activity: Not on file   Other Topics Concern  . Not on file   Social History Narrative   0 caffeine drinks daily   :  Pertinent items are noted in HPI.  Exam: ECOG 1 Blood pressure 141/81, pulse 88, temperature 97.6 F (36.4 C), resp. rate 18, height _0  (1.854 m), weight 206 lb 12.8 oz (93.804 kg), SpO2 97 %. General appearance: alert and cooperative appeared well without distress. Head: Normocephalic, without obvious abnormality no oral ulcers or lesions. Throat: lips, mucosa, and tongue normal; teeth and gums normal Neck: no adenopathy Back: negative Resp: clear to auscultation bilaterally Cardio: regular rate and  rhythm, S1, S2 normal, no murmur, click, rub or gallop GI: soft, non-tender; bowel sounds normal; no masses,  no organomegaly Extremities: extremities normal, atraumatic, no cyanosis or edema Pulses: 2+ and symmetric Skin: Skin color, texture, turgor normal. No rashes or lesions Lymph nodes: Cervical, supraclavicular, and axillary nodes normal.  neurological exam: No asterixis.  CBC    Component Value Date/Time   WBC 2.1* 12/17/2015 1159   WBC 1.6* 06/15/2015 1408   RBC 3.72* 12/17/2015 1159   RBC 3.87* 06/15/2015 1414   RBC 3.84* 06/15/2015 1408   HGB 13.1 12/17/2015 1159   HGB 13.4 06/15/2015 1408   HCT 39.8 12/17/2015 1159   HCT 39.5 06/15/2015 1408   PLT 53* 12/17/2015 1159   PLT 40* 06/15/2015 1408   MCV 107.0* 12/17/2015 1159   MCV 103* 06/15/2015 1408   MCH 35.2* 12/17/2015 1159   MCH 34.9* 06/15/2015 1408   MCHC 32.9 12/17/2015 1159   MCHC 33.9 06/15/2015 1408   RDW 14.3 12/17/2015 1159   RDW 13.1 06/15/2015 1408   LYMPHSABS 0.7* 06/15/2015 1408   LYMPHSABS 0.7 06/12/2015 1143   MONOABS 0.2 06/12/2015 1143   EOSABS 0.0 06/15/2015 1408   EOSABS 0.0 06/12/2015 1143   BASOSABS 0.0 06/15/2015 1408   BASOSABS 0.0 06/12/2015 1143      Chemistry       Component Value Date/Time   NA 137 12/17/2015 1159   NA 140 06/15/2015 1414   K 4.2 12/17/2015 1159   K 3.6 06/15/2015 1414   CL 105 12/17/2015 1159   CL 106 06/15/2015 1414   CO2 18* 12/17/2015 1159   CO2 17* 06/15/2015 1414   BUN 23* 12/17/2015 1159   BUN 18 06/15/2015 1414   CREATININE 1.51* 12/17/2015 1159   CREATININE 1.5* 06/15/2015 1414      Component Value Date/Time   CALCIUM 9.0 12/17/2015 1159   CALCIUM 9.5 06/15/2015 1414   ALKPHOS 162* 12/04/2015 0833   ALKPHOS 145* 06/15/2015 1414   AST 75* 12/04/2015 0833   AST 68* 06/15/2015 1414   ALT 33 12/04/2015 0833   ALT 35 06/15/2015 1414   BILITOT 1.4* 12/04/2015 0833   BILITOT 1.40 06/15/2015 1414        Ct Chest Wo Contrast  12/24/2015  CLINICAL DATA:  Follow-up abnormal chest x-ray. EXAM: CT CHEST WITHOUT CONTRAST TECHNIQUE: Multidetector CT imaging of the chest was performed following the standard protocol without IV contrast. COMPARISON:  Chest x-ray 12/12/2015.  Chest CT 11/30/2012. FINDINGS: 8 mm nodule peripherally in the left lung base on image 48. This is stable since 2014. Increased markings in the bases likely reflect atelectasis bilaterally. No confluent opacities in the lungs. No pleural effusions. There is cardiomegaly. Aortic arch and root calcifications. No aneurysm. No mediastinal, hilar, or axillary adenopathy. Mild bilateral gynecomastia. Chest wall soft tissues otherwise unremarkable. Imaging into the upper abdomen shows nodular contours of the liver compatible with cirrhosis. Associated splenomegaly. No acute bony abnormality or focal bone lesion. IMPRESSION: Minimal bibasilar densities likely reflect atelectasis. 8 mm nodule at the left lung base is stable since 2014 and most compatible with a benign nodule. Cardiomegaly.  Aortic atherosclerosis. Hepatic cirrhosis.  Associated splenomegaly. Electronically Signed   By: Rolm Baptise M.D.   On: 12/24/2015 15:38    EXAM: ULTRASOUND ABDOMEN  COMPLETE  COMPARISON: Abdominal ultrasound of December 08, 2012  FINDINGS: Gallbladder: The gallbladder is only partially distended. The gallbladder wall measures 4 mm. No stones are evident There is no sonographic Murphy's  sign.  Common bile duct: Diameter: 4 mm  Liver: The hepatic echotexture is heterogeneous. The surface contour is irregular. There is no focal mass nor ductal dilation.  IVC: Evaluation is limited by bowel gas.  Pancreas: Evaluation is limited by bowel gas.  Spleen: There is mild splenomegaly with maximal splenic volume of 570 cc. A 1.4 cm accessory spleen is present in the splenic hilum.  Right Kidney: Length: 10.8 cm. Echogenicity within normal limits. No mass or hydronephrosis visualized.  Left Kidney: Length: 11.3 cm. Echogenicity within normal limits. No mass or hydronephrosis visualized.  Abdominal aorta: No aneurysm visualized.  Other findings: No ascites is demonstrated.  IMPRESSION: 1. Cirrhotic changes within the liver. No hepatic mass is observed. 2. Limited distention of the gallbladder without evidence of stones or other acute abnormality. 3. Moderate splenomegaly.   Assessment and Plan:   65 year old gentleman with the following issues:  1. Thrombocytopenia and leukocytopenia: These findings have been documented for many years dating back to at least 2012. These findings are noted out related to 2 hypersplenism which is a consequence of his portal hypertension and cirrhosis of the liver. Because of the portal hypertension and splenomegaly, his thrombocytopenia and leukocytopenia is a direct consequence of spleen clearing his platelets and neutrophils rather more robustly.  Alcohol damaging of the bone marrow as well as a distinct possibility as well. He is a heavy drinker which contributes to bone marrow suppression.   From a management standpoint, his counts are adequate at this time. He had not had any recurrent infections  that requires growth factor support. He'll also did not report any spontaneous bleeding. He underwent a right heart catheterization without any postoperative bleeding.  I have recommended routine monitoring and repeat CBC in 6 months. If his platelet was dropped down dramatically, we can consider growth factor support to boost production but I do not anticipate that to happen anytime soon.  2. Cirrhosis of the liver: Related to his alcohol consumption which I have counseled him about drinking cessation at this time.  3. Follow-up: Will be in 6 months sooner if needed to.

## 2015-12-25 NOTE — Telephone Encounter (Signed)
Pt confirmed labs/ov per 02/21 POF, gave pt AVS and Calendar... KJ

## 2015-12-25 NOTE — Progress Notes (Signed)
Please see consult note.  

## 2015-12-27 ENCOUNTER — Telehealth: Payer: Self-pay | Admitting: Pulmonary Disease

## 2015-12-27 DIAGNOSIS — R0600 Dyspnea, unspecified: Secondary | ICD-10-CM

## 2015-12-27 NOTE — Telephone Encounter (Signed)
Entered in error

## 2016-01-01 ENCOUNTER — Other Ambulatory Visit (HOSPITAL_COMMUNITY): Payer: Self-pay | Admitting: *Deleted

## 2016-01-01 ENCOUNTER — Telehealth (HOSPITAL_COMMUNITY): Payer: Self-pay

## 2016-01-01 DIAGNOSIS — IMO0002 Reserved for concepts with insufficient information to code with codable children: Secondary | ICD-10-CM

## 2016-01-01 MED ORDER — TADALAFIL (PAH) 20 MG PO TABS: ORAL_TABLET | ORAL | Status: DC

## 2016-01-01 NOTE — Telephone Encounter (Signed)
Patient returned call from CHF clinic after message left on his VM recently for CT chest results. Results reviewed with patient. Patient also states his arm is slowly swelling and becoming discolored (red, purple) surrounding cath site into bend of his elbow.  Sates hand is still warm and sensation has not been compromised.  Per Dr. Haroldine Laws ,advised patient to come by CHF clinic to assess in person.  Patient aware and agreeable to plan.  Renee Pain

## 2016-01-16 ENCOUNTER — Other Ambulatory Visit: Payer: Self-pay | Admitting: Family Medicine

## 2016-01-16 NOTE — Telephone Encounter (Signed)
Advise on this refill as is not on his current medication list.

## 2016-01-17 ENCOUNTER — Encounter (HOSPITAL_COMMUNITY): Payer: Self-pay | Admitting: *Deleted

## 2016-01-17 ENCOUNTER — Telehealth (HOSPITAL_COMMUNITY): Payer: Self-pay | Admitting: *Deleted

## 2016-01-17 NOTE — Telephone Encounter (Signed)
Pt request a letter with his diagnosis and that he is not able to work be faxed to [employer] at [phone], advisor center for academic support regarding his son Martinique Ulbrich.  Letter was completed and faxed

## 2016-01-29 ENCOUNTER — Encounter: Payer: Self-pay | Admitting: Gastroenterology

## 2016-01-29 ENCOUNTER — Ambulatory Visit (INDEPENDENT_AMBULATORY_CARE_PROVIDER_SITE_OTHER): Payer: Medicare Other | Admitting: Gastroenterology

## 2016-01-29 VITALS — BP 124/70 | HR 68 | Ht 73.0 in | Wt 203.0 lb

## 2016-01-29 DIAGNOSIS — R197 Diarrhea, unspecified: Secondary | ICD-10-CM

## 2016-01-29 DIAGNOSIS — K7030 Alcoholic cirrhosis of liver without ascites: Secondary | ICD-10-CM

## 2016-01-29 NOTE — Patient Instructions (Addendum)
Imodium every AM on scheduled basis. Continue cutting back on alcohol as best that you can. You will be set up for an upper endoscopy ([facility] for variceal surveillance). Please return to see Dr. Ardis Hughs in 3-4 months. You will be set up for an ultrasound for cirrhosis, screening for hepatoma. You have been scheduled for an abdominal ultrasound at [facility] (1st floor of hospital) on 01/31/16 at 1130 am. Please arrive 15 minutes prior to your appointment for registration. Make certain not to have anything to eat or drink 6 hours prior to your appointment. Should you need to reschedule your appointment, please contact radiology at [phone]. This test typically takes about 30 minutes to perform.

## 2016-01-29 NOTE — Progress Notes (Signed)
Review of pertinent gastrointestinal problems:  1. history of adenomatous colon polyps : Colonoscopy 12/10: "Two sessile polyps were found in hepatic flexure (8-69m), piecemeal resected and then tatoo'd with INigerInk" path showed "moderate to severe dysplasia" and he was recommended to have repeat examination at 3 month interval. Colonoscopy December 2012 found 4 polyps, one was piecemeal removed, adenoma. He was recommended to have repeat at 6 month recall. With pulm HTN, active, dyspnea this was changed to 04/2014. Colonsocopy 12/2013 Karalyn Kadel" one small polyp, was not adenomatous. Recall colonoscopy 12/2018 2. incidentally noted esophageal varices on 2012 EGD done for dysphasia. 2012 Abdominal ultrasound showed a normal liver without clear sign of cirrhosis. Was told to cut back on drinking.  3. 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 From chronic etoh over use. Labs 12/12 Hep A Ab +, Hep B S ag and Ab negative, Hep C Ab neg; ANA, AIA, AMA, ceruloplasm, iron testing all neg;   Last liver imaging: 06/2015, + cirrhosis, no focal masses  Last EGD: 2012 small esophageal varices   Hep B immunization series started 11/2012  4. 2014 right heart angiogram suggested severe right heart failure, pulmonary hypertension started on macitentan 01/18/2013. Followed closely by CHF clinic who has assumed lead role in managing diuretics. 5. Diarrhea: Colonoscopy 12/2013; normal TI, no macroscopic colitis, no microscopic colitis on biopsies.   HPI: This is a   very pleasant 65year old man whom I last saw about 2 years ago.  Chief complaint is persistent diarrhea  Still having diarrhea on a pretty regular basis.  Three times daily, very loose.  Never bloody. THis started when he began segexpin for pulm HTN (about a year ago).  I think his diarrhea dates back a little bit earlier since I do colonoscopy for him in  2015 for diarrhea, random biopsies showed no microscopic colitis.  He does take Imodium periodically but not on a daily basis. Generally will take it before he goes out.  He has cut back on his alcohol drinking recently. Up until a few weeks ago he was drinking 6 drinks 2 or 3 times per week, now he is taking 4 drinks total per week.  He has not had variceal surveillance in several years. His last ultrasound was about 6 months ago this showed no mass lesions in his liver,   Review of systems: Pertinent positive and negative review of systems were noted in the above HPI section. Complete review of systems was performed and was otherwise normal.   Past Medical History  Diagnosis Date  . Hypertension   . [place]izures [place]   . Hx of colonic polyps   . GERD (gastroesophageal reflux disease)   . Gout   . [place]  . Unspecified hypothyroidism 01/25/2013  . Unspecified pleural effusion   . Other chronic pulmonary heart diseases   . Hypopotassemia   . Renal insufficiency 07/18/2013  . Diarrhea 09/04/2013  . Otalgia 11/28/2013  . Dermatitis 06/10/2015  . Alcohol dependence in remission ([place] 03/22/2011    In remission   . Alcohol dependence ([place] 03/22/2011    In remission     Past Surgical History  Procedure Laterality Date  . Colonoscopy N/A 12/08/2013    Procedure: COLONOSCOPY;  Surgeon: DMilus Banister MD;  Location: [facility];  Service: Endoscopy;  Laterality: N/A;  . Right heart catheterization Right 06/06/2014  Procedure: RIGHT HEART CATH;  Surgeon: Jolaine Artist, MD;  Location: [facility] CATH LAB;  Service: Cardiovascular;  Laterality: Right;  . Uvulopalatopharyngoplasty  1999  . Cardiac catheterization N/A 12/21/2015    Procedure: Right Heart Cath;  Surgeon: Jolaine Artist, MD;  Location: [facility] CV LAB;  Service: Cardiovascular;  Laterality: N/A;    Current Outpatient Prescriptions  Medication Sig Dispense Refill  . albuterol  (PROVENTIL HFA;VENTOLIN HFA) 108 (90 BASE) MCG/ACT inhaler Inhale 2 puffs into the lungs every 6 (six) hours as needed for wheezing or shortness of breath.    . doxepin (SINEQUAN) 10 MG capsule TAKE 1 TO 2 CAPSULES AT BEDTIME 180 capsule 0  . Fluticasone Propionate 0.05 % LOTN Apply daily to affected area 3 Bottle 3  . furosemide (LASIX) 40 MG tablet Take 40 mg by mouth 2 (two) times a week. Take on Monday and Thursday    . hyoscyamine (SYMAX-SR) 0.375 MG 12 hr tablet Take 1 tablet (0.375 mg total) by mouth 2 (two) times daily. 180 tablet 0  . levothyroxine (SYNTHROID, LEVOTHROID) 25 MCG tablet Take 1 tablet (25 mcg total) by mouth daily. 90 tablet 1  . loperamide (ANTI-DIARRHEAL) 2 MG capsule Take 4 mg by mouth daily.     Marland Kitchen losartan (COZAAR) 50 MG tablet Take 1 tablet (50 mg total) by mouth daily. 90 tablet 3  . Macitentan (OPSUMIT) 10 MG TABS Take 10 mg by mouth daily. 90 tablet 3  . Melatonin 5 MG TABS Take 10 mg by mouth at bedtime.    . mometasone-formoterol (DULERA) 100-5 MCG/ACT AERO Inhale 2 puffs into the lungs 2 (two) times daily.    Marland Kitchen omeprazole (PRILOSEC) 40 MG capsule Take 1 capsule (40 mg total) by mouth daily. 90 capsule 2  . potassium chloride (K-DUR) 10 MEQ tablet Take 1 tablet (10 mEq total) by mouth daily. 360 tablet 3  . Selexipag (UPTRAVI) 1000 MCG TABS Take 1,000 mg by mouth every morning. 90 tablet 2  . Selexipag (UPTRAVI) 800 MCG TABS Take 1 tablet (800 mcg total) by mouth every evening. 90 tablet 2  . spironolactone (ALDACTONE) 50 MG tablet Take 1 tablet (50 mg total) by mouth daily. 90 tablet 1  . Tadalafil, PAH, (ADCIRCA) 20 MG TABS TAKE 2 TABLETS (40 MG TOTAL) DAILY 180 tablet 2  . verapamil (VERELAN PM) 180 MG 24 hr capsule Take 1 capsule (180 mg total) by mouth at bedtime. 90 capsule 1   No current facility-administered medications for this visit.    Allergies as of 01/29/2016 - Review Complete 01/29/2016  Allergen Reaction Noted  . Aspirin Other (See Comments)  03/21/2011    Family History  Problem Relation Age of Onset  . Heart disease Mother   . Prostate cancer Father   . Colon cancer Neg Hx   . Heart attack Mother   . Hypertension Mother     Social History   Social History  . Marital Status: Divorced    Spouse Name: N/A  . Number of Children: 3  . Years of Education: N/A   Occupational History  . Retired     Social research officer, government   Social History Main Topics  . Smoking status: Former Smoker -- 2.00 packs/day for 5 years    Types: Cigarettes    Quit date: 09/22/1979  . Smokeless tobacco: Never Used  . Alcohol Use: 0.0 oz/week    0 Standard drinks or equivalent per week     Comment: 2 vodka tonic mon, wed,  friday  . Drug Use: No  . Sexual Activity: Not on file   Other Topics Concern  . Not on file   Social History Narrative   0 caffeine drinks daily      Physical Exam: BP 124/70 mmHg  Pulse 68  Ht _0  (1.854 m)  Wt 203 lb (92.08 kg)  BMI 26.79 kg/m2 Constitutional: generally well-appearing Psychiatric: alert and oriented x3 Eyes: extraocular movements intact Mouth: oral pharynx moist, no lesions Neck: supple no lymphadenopathy Cardiovascular: heart regular rate and rhythm Lungs: clear to auscultation bilaterally Abdomen: soft, nontender, nondistended, no obvious ascites, no peritoneal signs, normal bowel sounds Extremities: no lower extremity edema bilaterally Skin: no lesions on visible extremities   Assessment and plan: 65 y.o. male with  cirrhosis, pulmonary hypertension  he continues to drink but not as much as he did previously. I recommended he continue to try to decrease that to 0 if he can. He has no edema on exam. He is due for hepatoma screening and surveillance endoscopy for varices. Lower ultrasound and we'll proceed with EGD at his soonest convenience. His biggest symptom is diarrhea and it certainly could be medication related. His pulmonary hypertension medicine lists diarrhea as a common side effect,  #2. Imodium helps but he does not take it on a scheduled basis I recommended he start doing that. He will begin taking a single Imodium every morning shortly after waking and he will take another one later on the day as needed. I think it is best that he be seen specifically for his liver disease at least every 3-4 months.   Owens Loffler, MD [facility] 01/29/2016, 11:06 AM  Cc: Mosie Lukes, MD

## 2016-01-30 ENCOUNTER — Ambulatory Visit (HOSPITAL_COMMUNITY)
Admission: RE | Admit: 2016-01-30 | Discharge: 2016-01-30 | Disposition: A | Discharge status: Home / Self Care | Payer: Medicare Other | Source: Ambulatory Visit | Attending: Internal Medicine | Admitting: Internal Medicine

## 2016-01-30 ENCOUNTER — Encounter (HOSPITAL_COMMUNITY): Payer: Self-pay | Admitting: Internal Medicine

## 2016-01-30 VITALS — BP 122/72 | HR 90 | Wt 208.0 lb

## 2016-01-30 DIAGNOSIS — I129 Hypertensive chronic kidney disease with stage 1 through stage 4 chronic kidney disease, or unspecified chronic kidney disease: Secondary | ICD-10-CM | POA: Insufficient documentation

## 2016-01-30 DIAGNOSIS — N189 Chronic kidney disease, unspecified: Secondary | ICD-10-CM | POA: Diagnosis not present

## 2016-01-30 DIAGNOSIS — D61818 Other pancytopenia: Secondary | ICD-10-CM | POA: Diagnosis not present

## 2016-01-30 DIAGNOSIS — K7031 Alcoholic cirrhosis of liver with ascites: Secondary | ICD-10-CM | POA: Diagnosis not present

## 2016-01-30 DIAGNOSIS — G4733 Obstructive sleep apnea (adult) (pediatric): Secondary | ICD-10-CM | POA: Diagnosis not present

## 2016-01-30 DIAGNOSIS — I2729 Other secondary pulmonary hypertension: Secondary | ICD-10-CM

## 2016-01-30 DIAGNOSIS — K7460 Unspecified cirrhosis of liver: Secondary | ICD-10-CM | POA: Diagnosis not present

## 2016-01-30 DIAGNOSIS — I272 Other secondary pulmonary hypertension: Secondary | ICD-10-CM

## 2016-01-30 DIAGNOSIS — Z79899 Other long term (current) drug therapy: Secondary | ICD-10-CM | POA: Insufficient documentation

## 2016-01-30 DIAGNOSIS — J449 Chronic obstructive pulmonary disease, unspecified: Secondary | ICD-10-CM | POA: Insufficient documentation

## 2016-01-30 DIAGNOSIS — F1029 Alcohol dependence with unspecified alcohol-induced disorder: Secondary | ICD-10-CM | POA: Diagnosis not present

## 2016-01-30 DIAGNOSIS — I50810 Right heart failure, unspecified: Secondary | ICD-10-CM

## 2016-01-30 NOTE — Progress Notes (Signed)
Patient ID: Darren Mueller, male   DOB: [birth year], 65 y.o.   MRN: [id number]  [facility] NOTE  Patient ID: Darren Mueller, male   DOB: [birth year], 65 y.o.   MRN: [id number] GI : Dr Christella Hartigan PCP: Dr Abner Greenspan  Subjective:    [name] ("[name]") is a 65 yo with history of COPD, portopulmonary hypertension with RV failure, and ETOH cirrhosis presents for followup of his PAH.  He returns today for regular PAH follow up. Since we last saw him underwent RHC. Pulmonary pressures higher since previous but cardiac improved. However PVR still very high and RAP 11. Had CT of chest. Only notable for cirrhosis and splenomegaly. Saw Dr. Christella Hartigan in GI who ordered hepatoma screening, liver u/s and EGD to screen for varices  Continues with selexipag at 800 qam/ 1000 qpm. Unable to tolerate any further uptitration with HAs and worsening flushing.  Remains on Macitentan and Adcirca. Continues to go to [employer].  Hasn't had swelling or needed extra lasix. NYHA III symptoms. Continues to drink, at least 2-3 times a week, and cuts himself off at 2 drinks (Vodka tonics).  No lightheadedness or dizziness. Denies presyncope/syncope.     PAH meds 1) Macitnentan 10 2) Adcirca 10 3) Selexapeg  Studies:  ECHO 12/14 EF 55-60% Peak PA pressure 35. Severe RV dysfunction  ECHO 7/15 EF 60% RV moderately to severely dilated. Moderate HK RVSP 57mm HG  ECHO 6/16 EF 60% RV moderately to severely dilated. Severe HK RVSP ~65 mm HG. D-shaped septum Echo 2/17 LVEF 60-65% RV massively dilated. Flat septum. Severe HK. Moderate TR RVSP ~65. IVC small. No effusion   RHC 2/17 RA = 11 RV = 80/16/19 PA = 83/61 (71) PCW = 9 Fick cardiac output/index = 5.6/2.6 PVR = 10.3 WU Ao sat = 94% PA sat = 65%, 68% High SVC sat = 65%  RHC 2/14: RA = 13  RV = 63/5/14  PA = 65/24 (41)  PCW = 6  Hepatic wedge = 21  Fick cardiac output/index = 3.8/1.8  PVR = 9.1  FA sat = 94%  PA sat = 58%, 56%  SVC = 54%  RHC 06/06/14 RA =  5  RV = 73/5/9  PA = 81/25 (46)  PCW = 7  Fick cardiac output/index = 6.0/2.8  PVR = 6.5 [place]  FA sat = 95%  PA sat = 71%, 67%  Unable to get hepatic wedge  PFTs 3/14 FEV1 2.02 L (58%) FVC 2.7L (54%) FEV1/FVC (89%) DLCO 53%  PFTs 1/17 FEV1 2.05 L (61%) FVC 2.48L (56%) DLCO 44%   Event Monitor - NSR 10/26/13.   Ab u/s 8/16: + cirrhosis/mild to moderate splenomegaly  6 min walk 01/10/13, 1290 feet  (01/10/14) = 1280 feet (380 m) (9/15) = 1350 feet (411 m) O2 sats ranged from 89-95% on room air, HR ranged from 100-129. (6/16) = 384 meters  (2/17) = 1250 feet (324m)  Labs:  10/19/13 Creatinine 1.79 K 4.1  3/15        Cr 1.78 K 4.2 03/17/14    CR 1.23 K 4.0 LFTs ok 06/06/14 K 4.0 Creatinine 1.4  08/08/14 K 4.0 Cr 1.6 09/12/14 K 4.3 Cr 1.8 LFTs ok, HCT 58 12/15 K 4.3 Cr 1.15  06/2015 K 3.6, Cr 1.5 1/17  K 3.7 Cr 1.29 AST 75 ALT 33 Albumin 4.0 Bili 1.4 Wbc 2.2 hgb 13.5 PLT 61k   Current Outpatient Prescriptions on File Prior to Encounter  Medication Sig Dispense Refill  . albuterol (PROVENTIL HFA;VENTOLIN HFA) 108 (90 BASE) MCG/ACT inhaler Inhale 2 puffs into the lungs every 6 (six) hours as needed for wheezing or shortness of breath.    . doxepin (SINEQUAN) 10 MG capsule TAKE 1 TO 2 CAPSULES AT BEDTIME 180 capsule 0  . Fluticasone Propionate 0.05 % LOTN Apply daily to affected area 3 Bottle 3  . furosemide (LASIX) 40 MG tablet Take 40 mg by mouth 2 (two) times a week. Take on Monday and Thursday    . hyoscyamine (SYMAX-SR) 0.375 MG 12 hr tablet Take 1 tablet (0.375 mg total) by mouth 2 (two) times daily. 180 tablet 0  . levothyroxine (SYNTHROID, LEVOTHROID) 25 MCG tablet Take 1 tablet (25 mcg total) by mouth daily. 90 tablet 1  . loperamide (ANTI-DIARRHEAL) 2 MG capsule Take 4 mg by mouth daily.     Marland Kitchen losartan (COZAAR) 50 MG tablet Take 1 tablet (50 mg total) by mouth daily. 90 tablet 3  . Macitentan (OPSUMIT) 10 MG TABS Take 10 mg by mouth daily. 90 tablet 3    . Melatonin 5 MG TABS Take 10 mg by mouth at bedtime.    . mometasone-formoterol (DULERA) 100-5 MCG/ACT AERO Inhale 2 puffs into the lungs 2 (two) times daily.    Marland Kitchen omeprazole (PRILOSEC) 40 MG capsule Take 1 capsule (40 mg total) by mouth daily. 90 capsule 2  . potassium chloride (K-DUR) 10 MEQ tablet Take 1 tablet (10 mEq total) by mouth daily. 360 tablet 3  . Selexipag (UPTRAVI) 1000 MCG TABS Take 1,000 mg by mouth every morning. 90 tablet 2  . Selexipag (UPTRAVI) 800 MCG TABS Take 1 tablet (800 mcg total) by mouth every evening. 90 tablet 2  . spironolactone (ALDACTONE) 50 MG tablet Take 1 tablet (50 mg total) by mouth daily. 90 tablet 1  . Tadalafil, PAH, (ADCIRCA) 20 MG TABS TAKE 2 TABLETS (40 MG TOTAL) DAILY 180 tablet 2  . verapamil (VERELAN PM) 180 MG 24 hr capsule Take 1 capsule (180 mg total) by mouth at bedtime. 90 capsule 1   No current facility-administered medications on file prior to encounter.    Objective:   Weight Range:  Vital Signs:   Pulse Rate:  [90] 90 (03/29 0908) BP: (122)/(72) 122/72 mmHg (03/29 0908) SpO2:  [97 %] 97 % (03/29 0908) Weight:  [208 lb (94.348 kg)] 208 lb (94.348 kg) (03/29 0908)    Wt Readings from Last 3 Encounters:  01/30/16 208 lb (94.348 kg)  01/29/16 203 lb (92.08 kg)  12/25/15 206 lb 12.8 oz (93.804 kg)     Physical Exam: General: NAD, facial flushing.  Neck: JVP ~10  cm  no thyromegaly or thyroid nodule.  Lungs: CTAB, normal effort. CV: Nondisplaced PMI. Heart regular  2/6 TR P2 LLSB. No carotid bruit.   Abdomen: Soft, NT, ND, no HSM. No bruits or masses. +BS  Ext: warm, No clubbing, trace edema.Normal pedal pulses. Skin: Intact without lesions or rashes.  Neurologic: Alert and oriented x 3.  Psych: Normal affect.  Extremities: No clubbing or cyanosis.  HEENT: Normal. Except for erythematous rash/flushing   Assessment/Plan    1. PAH: Suspected portopulmonary HTN. - Remains on triple therapy. Recent RHC still with  significant PAH. - Persistent NYHA III symptoms. stable - Continue macitentan 10 daily, adcirca 40 mg daily, selexipag 1000 qam/800 qpm unable to titrate further - Will continue current therapy. Not candidate for IV therapies at this point but will closely for  decompensation.  - Long talk about absolute need to stop ETOH completely.  - Volume status ok 2. CKD:  - Check BMET/BNP today. Has been stable at previous checks.  3. Cirrhosis:  - Likely combination of RV failure an ETOH - As above, again recommended complete cessation of ETOH.  - Saw Dr. Christella Hartigan and further testing pending. - Albumin 4.0 but AST remains elevated and platelets low. Have referred to GI for any additional recommendations. - No evidence of hepatic encephalopathy 4. HTN:  - Stable on current regimen.  5. OSA:  -Has mild OSA with AHI 12 and desats down to 82%.  - Intolerant CPAP.  - Have encouraged to use a pulse ox and use 02 prn to keep sats > 90% 6. Pancytopenia:  - Progressive. Most likely due to cirrhosis. Has seen Dr. Myna Hidalgo in Hematology as well as Dr. Clelia Croft. Will follow with q6 month CBC.  7. Abnormal CXR - CT chest ok.     Rithika Seel,MD 9:11 AM

## 2016-01-30 NOTE — Patient Instructions (Signed)
Follow up 3 months with Dr. Haroldine Laws.  Do the following things EVERYDAY: 1) Weigh yourself in the morning before breakfast. Write it down and keep it in a log. 2) Take your medicines as prescribed 3) Eat low salt foods-Limit salt (sodium) to 2000 mg per day.  4) Stay as active as you can everyday 5) Limit all fluids for the day to less than 2 liters

## 2016-01-30 NOTE — Addendum Note (Signed)
Encounter addended by: Effie Berkshire, RN on: 01/30/2016  9:42 AM<BR>     Documentation filed: Patient Instructions Section

## 2016-01-31 ENCOUNTER — Ambulatory Visit (HOSPITAL_COMMUNITY): Admission: RE | Admit: 2016-01-31 | Payer: Medicare Other | Source: Ambulatory Visit

## 2016-02-19 ENCOUNTER — Telehealth: Payer: Self-pay

## 2016-02-19 NOTE — Telephone Encounter (Signed)
The pt has been set up for [facility] on 03/27/16 1130 am, the pt is aware and instructions will be mailed to the pt.

## 2016-02-19 NOTE — Telephone Encounter (Signed)
OK, lets get it scheduled for my next hosp week, to be done at [facility].  Thanks

## 2016-02-19 NOTE — Progress Notes (Signed)
Spoke with dr Stanton Kidney judd and dr Delma Post and made aware patientmedical history pulmonary htn and multiple medical issues, per dr Stanton Kidney judd and dr Delma Post patient needs egd done at [facility], left message for patty lewis for dr Ardis Hughs.

## 2016-02-19 NOTE — Telephone Encounter (Signed)
Dr Ardis Hughs per anesthesia the pt needs to be done at [facility] endo instead of [facility] due to pulmonary issues.  Please advise on scheduling.

## 2016-02-28 DIAGNOSIS — K3189 Other diseases of stomach and duodenum: Secondary | ICD-10-CM | POA: Diagnosis not present

## 2016-02-28 DIAGNOSIS — K766 Portal hypertension: Secondary | ICD-10-CM | POA: Diagnosis not present

## 2016-02-28 DIAGNOSIS — I8510 Secondary esophageal varices without bleeding: Secondary | ICD-10-CM | POA: Diagnosis not present

## 2016-02-28 DIAGNOSIS — K7030 Alcoholic cirrhosis of liver without ascites: Secondary | ICD-10-CM | POA: Diagnosis not present

## 2016-03-01 ENCOUNTER — Other Ambulatory Visit: Payer: Self-pay | Admitting: Family Medicine

## 2016-03-03 ENCOUNTER — Ambulatory Visit (INDEPENDENT_AMBULATORY_CARE_PROVIDER_SITE_OTHER): Payer: Medicare Other | Admitting: Family Medicine

## 2016-03-03 ENCOUNTER — Encounter: Payer: Self-pay | Admitting: Family Medicine

## 2016-03-03 VITALS — BP 116/62 | HR 86 | Temp 98.1°F | Ht 74.0 in | Wt 203.4 lb

## 2016-03-03 DIAGNOSIS — I10 Essential (primary) hypertension: Secondary | ICD-10-CM | POA: Diagnosis not present

## 2016-03-03 DIAGNOSIS — R197 Diarrhea, unspecified: Secondary | ICD-10-CM

## 2016-03-03 DIAGNOSIS — Z23 Encounter for immunization: Secondary | ICD-10-CM | POA: Diagnosis not present

## 2016-03-03 DIAGNOSIS — E038 Other specified hypothyroidism: Secondary | ICD-10-CM

## 2016-03-03 DIAGNOSIS — H578 Other specified disorders of eye and adnexa: Secondary | ICD-10-CM

## 2016-03-03 DIAGNOSIS — K219 Gastro-esophageal reflux disease without esophagitis: Secondary | ICD-10-CM

## 2016-03-03 DIAGNOSIS — G4733 Obstructive sleep apnea (adult) (pediatric): Secondary | ICD-10-CM

## 2016-03-03 DIAGNOSIS — G4730 Sleep apnea, unspecified: Secondary | ICD-10-CM | POA: Diagnosis not present

## 2016-03-03 DIAGNOSIS — H5789 Other specified disorders of eye and adnexa: Secondary | ICD-10-CM | POA: Insufficient documentation

## 2016-03-03 LAB — COMPREHENSIVE METABOLIC PANEL
ALBUMIN: 4.1 g/dL (ref 3.5–5.2)
ALT: 27 U/L (ref 0–53)
AST: 67 U/L — AB (ref 0–37)
Alkaline Phosphatase: 156 U/L — ABNORMAL HIGH (ref 39–117)
BUN: 24 mg/dL — ABNORMAL HIGH (ref 6–23)
CALCIUM: 9.2 mg/dL (ref 8.4–10.5)
CHLORIDE: 108 meq/L (ref 96–112)
CO2: 22 mEq/L (ref 19–32)
CREATININE: 1.58 mg/dL — AB (ref 0.40–1.50)
GFR: 56.7 mL/min — ABNORMAL LOW (ref >60.00)
Glucose, Bld: 93 mg/dL (ref 70–99)
Potassium: 3.8 mEq/L (ref 3.5–5.1)
Sodium: 139 mEq/L (ref 135–145)
Total Bilirubin: 1.3 mg/dL — ABNORMAL HIGH (ref 0.2–1.2)
Total Protein: 7.5 g/dL (ref 6.0–8.3)

## 2016-03-03 MED ORDER — ZOSTER VACCINE LIVE 19400 UNT/0.65ML ~~LOC~~ SUSR
0.6500 mL | Freq: Once | SUBCUTANEOUS | Status: DC
Start: 2016-03-03 — End: 2016-04-14

## 2016-03-03 NOTE — Patient Instructions (Signed)
Sleep Apnea  ° °Sleep apnea is a sleep disorder characterized by abnormal pauses in breathing while you sleep. When your breathing pauses, the level of oxygen in your blood decreases. This causes you to move out of deep sleep and into light sleep. As a result, your quality of sleep is poor, and the system that carries your blood throughout your body (cardiovascular system) experiences stress. If sleep apnea remains untreated, the following conditions can develop: °· High blood pressure (hypertension). °· Coronary artery disease. °· Inability to achieve or maintain an erection (impotence). °· Impairment of your thought process (cognitive dysfunction). °There are three types of sleep apnea: °1. Obstructive sleep apnea--Pauses in breathing during sleep because of a blocked airway. °2. Central sleep apnea--Pauses in breathing during sleep because the area of the brain that controls your breathing does not send the correct signals to the muscles that control breathing. °3. Mixed sleep apnea--A combination of both obstructive and central sleep apnea. °RISK FACTORS °The following risk factors can increase your risk of developing sleep apnea: °· Being overweight. °· Smoking. °· Having narrow passages in your nose and throat. °· Being of older age. °· Being male. °· Alcohol use. °· Sedative and tranquilizer use. °· Ethnicity. Among individuals younger than 35 years, African Americans are at increased risk of sleep apnea. °SYMPTOMS  °· Difficulty staying asleep. °· Daytime sleepiness and fatigue. °· Loss of energy. °· Irritability. °· Loud, heavy snoring. °· Morning headaches. °· Trouble concentrating. °· Forgetfulness. °· Decreased interest in sex. °· Unexplained sleepiness. °DIAGNOSIS  °In order to diagnose sleep apnea, your caregiver will perform a physical examination. A sleep study done in the comfort of your own home may be appropriate if you are otherwise healthy. Your caregiver may also recommend that you spend the  night in a sleep lab. In the sleep lab, several monitors record information about your heart, lungs, and brain while you sleep. Your leg and arm movements and blood oxygen level are also recorded. °TREATMENT °The following actions may help to resolve mild sleep apnea: °· Sleeping on your side.   °· Using a decongestant if you have nasal congestion.   °· Avoiding the use of depressants, including alcohol, sedatives, and narcotics.   °· Losing weight and modifying your diet if you are overweight. °There also are devices and treatments to help open your airway: °· Oral appliances. These are custom-made mouthpieces that shift your lower jaw forward and slightly open your bite. This opens your airway. °· Devices that create positive airway pressure. This positive pressure "splints" your airway open to help you breathe better during sleep. The following devices create positive airway pressure: °¨ Continuous positive airway pressure (CPAP) device. The CPAP device creates a continuous level of air pressure with an air pump. The air is delivered to your airway through a mask while you sleep. This continuous pressure keeps your airway open. °¨ Nasal expiratory positive airway pressure (EPAP) device. The EPAP device creates positive air pressure as you exhale. The device consists of single-use valves, which are inserted into each nostril and held in place by adhesive. The valves create very little resistance when you inhale but create much more resistance when you exhale. That increased resistance creates the positive airway pressure. This positive pressure while you exhale keeps your airway open, making it easier to breath when you inhale again. °¨ Bilevel positive airway pressure (BPAP) device. The BPAP device is used mainly in patients with central sleep apnea. This device is similar to the CPAP device   because it also uses an air pump to deliver continuous air pressure through a mask. However, with the BPAP machine, the  pressure is set at two different levels. The pressure when you exhale is lower than the pressure when you inhale. °· Surgery. Typically, surgery is only done if you cannot comply with less invasive treatments or if the less invasive treatments do not improve your condition. Surgery involves removing excess tissue in your airway to create a wider passage way. °  °This information is not intended to replace advice given to you by your health care provider. Make sure you discuss any questions you have with your health care provider. °  °Document Released: 10/10/2002 Document Revised: 11/10/2014 Document Reviewed: 02/26/2012 °Elsevier Interactive Patient Education ©2016 Elsevier Inc. ° °

## 2016-03-03 NOTE — Assessment & Plan Note (Signed)
Not painful, burst blood vessel in sclera. No concerns on exam

## 2016-03-03 NOTE — Assessment & Plan Note (Signed)
Check CMP today, continue monitor

## 2016-03-03 NOTE — Assessment & Plan Note (Signed)
Is doing worse again since increa. He is trying to maintain the increased dose Selexipag but also experiencing more flushing.

## 2016-03-03 NOTE — Assessment & Plan Note (Signed)
Avoid offending foods, start probiotics. Do not eat large meals in late evening and consider raising head of bed.  

## 2016-03-03 NOTE — Assessment & Plan Note (Signed)
Has not been using CPAP due to uncomfortable with mask will refer bac to pulmonology

## 2016-03-03 NOTE — Assessment & Plan Note (Signed)
Patient has stopped using his CPAP, made him too anxious. Is willing

## 2016-03-03 NOTE — Progress Notes (Signed)
Pre visit review using our clinic review tool, if applicable. No additional management support is needed unless otherwise documented below in the visit note. 

## 2016-03-03 NOTE — Progress Notes (Signed)
Subjective:    Patient ID: Darren Mueller, male    DOB: 23-Feb-1951, 65 y.o.   MRN: [id number]  Chief Complaint  Patient presents with  . Follow-up    HPI Patient is in today for follow up. Patient is doing well except for increased diarrhea and flushing due to increased Selexipag. No fevers, chills bloody stool. Persistent fatigue, alcohol consumption is down but persists. Is not using CPAP. Denies CP/palp/SOB/HA/congestion/fever /GU c/o. Taking meds as prescribed. Right red eye no trauma, no pain. No visual changes  Past Medical History  Diagnosis Date  . Hypertension   . Seizures ([place])   . Hx of colonic polyps   . GERD (gastroesophageal reflux disease)   . Gout   . Alcoholic cirrhosis of liver with ascites ([place]) 2014  . Unspecified hypothyroidism 01/25/2013  . Unspecified pleural effusion   . Other chronic pulmonary heart diseases   . Hypopotassemia   . Renal insufficiency 07/18/2013  . Diarrhea 09/04/2013  . Otalgia 11/28/2013  . Dermatitis 06/10/2015  . Alcohol dependence in remission ([place]) 03/22/2011    In remission   . Alcohol dependence ([place]) 03/22/2011    In remission     Past Surgical History  Procedure Laterality Date  . Colonoscopy N/A 12/08/2013    Procedure: COLONOSCOPY;  Surgeon: Milus Banister, MD;  Location: [facility] ENDOSCOPY;  Service: Endoscopy;  Laterality: N/A;  . Right heart catheterization Right 06/06/2014    Procedure: RIGHT HEART CATH;  Surgeon: Jolaine Artist, MD;  Location: [facility] CATH LAB;  Service: Cardiovascular;  Laterality: Right;  . Uvulopalatopharyngoplasty  1999  . Cardiac catheterization N/A 12/21/2015    Procedure: Right Heart Cath;  Surgeon: Jolaine Artist, MD;  Location: [facility] CV LAB;  Service: Cardiovascular;  Laterality: N/A;    Family History  Problem Relation Age of Onset  . Heart disease Mother   . Prostate cancer Father   . Colon cancer Neg Hx   . Heart attack Mother   . Hypertension Mother     Social History   Social  History  . Marital Status: Divorced    Spouse Name: N/A  . Number of Children: 3  . Years of Education: N/A   Occupational History  . Retired     Social research officer, government   Social History Main Topics  . Smoking status: Former Smoker -- 2.00 packs/day for 5 years    Types: Cigarettes    Quit date: 09/22/1979  . Smokeless tobacco: Never Used  . Alcohol Use: 0.0 oz/week    0 Standard drinks or equivalent per week     Comment: 2 vodka tonic mon, wed, friday  . Drug Use: No  . Sexual Activity: Not on file   Other Topics Concern  . Not on file   Social History Narrative   0 caffeine drinks daily     Outpatient Prescriptions Prior to Visit  Medication Sig Dispense Refill  . albuterol (PROVENTIL HFA;VENTOLIN HFA) 108 (90 BASE) MCG/ACT inhaler Inhale 2 puffs into the lungs every 6 (six) hours as needed for wheezing or shortness of breath.    . doxepin (SINEQUAN) 10 MG capsule TAKE 1 TO 2 CAPSULES AT BEDTIME (Patient taking differently: TAKE 1 TO 2 CAPSULES AT BEDTIME as needed for sleep) 180 capsule 0  . Fluticasone Propionate 0.05 % LOTN Apply daily to affected area (Patient taking differently: Apply 1 application topically daily. Apply daily to affected area) 3 Bottle 3  . furosemide (LASIX) 40 MG tablet Take 40 mg  by mouth every Monday, Wednesday, and Friday.     . levothyroxine (SYNTHROID, LEVOTHROID) 25 MCG tablet Take 1 tablet (25 mcg total) by mouth daily. 90 tablet 1  . loperamide (ANTI-DIARRHEAL) 2 MG capsule Take 4 mg by mouth daily.     Marland Kitchen losartan (COZAAR) 50 MG tablet Take 1 tablet (50 mg total) by mouth daily. 90 tablet 3  . Macitentan (OPSUMIT) 10 MG TABS Take 10 mg by mouth daily. 90 tablet 3  . mometasone-formoterol (DULERA) 100-5 MCG/ACT AERO Inhale 2 puffs into the lungs daily.     Marland Kitchen omeprazole (PRILOSEC) 40 MG capsule Take 1 capsule (40 mg total) by mouth daily. 90 capsule 2  . potassium chloride (K-DUR) 10 MEQ tablet Take 1 tablet (10 mEq total) by mouth daily. (Patient taking  differently: Take 10 mEq by mouth 2 (two) times daily. ) 360 tablet 3  . Selexipag (UPTRAVI) 1000 MCG TABS Take 1,000 mg by mouth every morning. (Patient taking differently: Take 1,000 mg by mouth at bedtime. ) 90 tablet 2  . Selexipag (UPTRAVI) 800 MCG TABS Take 1 tablet (800 mcg total) by mouth every evening. (Patient taking differently: Take 800 mcg by mouth daily. ) 90 tablet 2  . spironolactone (ALDACTONE) 50 MG tablet Take 1 tablet (50 mg total) by mouth daily. 90 tablet 1  . SYMAX-SR 0.375 MG 12 hr tablet TAKE 1 TABLET TWICE A DAY 180 tablet 1  . Tadalafil, PAH, (ADCIRCA) 20 MG TABS TAKE 2 TABLETS (40 MG TOTAL) DAILY 180 tablet 2  . verapamil (VERELAN PM) 180 MG 24 hr capsule Take 1 capsule (180 mg total) by mouth at bedtime. (Patient taking differently: Take 180 mg by mouth daily. ) 90 capsule 1   No facility-administered medications prior to visit.    Allergies  Allergen Reactions  . Aspirin Other (See Comments)    hypertension    Review of Systems  Constitutional: Positive for malaise/fatigue. Negative for fever.  HENT: Negative for congestion.   Eyes: Negative for blurred vision.  Respiratory: Negative for shortness of breath.   Cardiovascular: Negative for chest pain, palpitations and leg swelling.  Gastrointestinal: Positive for diarrhea. Negative for nausea, abdominal pain and blood in stool.  Genitourinary: Negative for dysuria and frequency.  Musculoskeletal: Negative for falls.  Skin: Negative for rash.  Neurological: Negative for dizziness, loss of consciousness and headaches.  Endo/Heme/Allergies: Negative for environmental allergies.  Psychiatric/Behavioral: Negative for depression. The patient is not nervous/anxious.        Objective:    Physical Exam  Constitutional: He is oriented to person, place, and time. He appears well-developed and well-nourished. No distress.  HENT:  Head: Normocephalic and atraumatic.  Eyes: Conjunctivae are normal.  Right eye  sclerae burst, red  Neck: Neck supple. No thyromegaly present.  Cardiovascular: Normal rate and regular rhythm.   Murmur heard. Pulmonary/Chest: Effort normal and breath sounds normal. No respiratory distress. He has no wheezes.  Abdominal: Soft. Bowel sounds are normal. He exhibits no mass. There is no tenderness.  Musculoskeletal: He exhibits no edema.  Lymphadenopathy:    He has no cervical adenopathy.  Neurological: He is alert and oriented to person, place, and time.  Skin: Skin is warm and dry.  Psychiatric: He has a normal mood and affect. His behavior is normal.    BP 116/62 mmHg  Pulse 86  Temp(Src) 98.1 F (36.7 C) (Oral)  Ht _0  (1.88 m)  Wt 203 lb 6 oz (92.25 kg)  BMI 26.10 kg/m2  SpO2 93% Wt Readings from Last 3 Encounters:  03/03/16 203 lb 6 oz (92.25 kg)  01/30/16 208 lb (94.348 kg)  01/29/16 203 lb (92.08 kg)     Lab Results  Component Value Date   WBC 2.1* 12/17/2015   HGB 13.1 12/17/2015   HCT 39.8 12/17/2015   PLT 53* 12/17/2015   GLUCOSE 107* 12/17/2015   CHOL 163 12/04/2015   TRIG 80.0 12/04/2015   HDL 69.90 12/04/2015   LDLDIRECT 72.0 06/01/2015   LDLCALC 77 12/04/2015   ALT 33 12/04/2015   AST 75* 12/04/2015   NA 137 12/17/2015   K 4.2 12/17/2015   CL 105 12/17/2015   CREATININE 1.51* 12/17/2015   BUN 23* 12/17/2015   CO2 18* 12/17/2015   TSH 2.47 12/04/2015   PSA 0.49 11/01/2012   INR 1.25 12/17/2015   HGBA1C 5.4 03/02/2015    Lab Results  Component Value Date   TSH 2.47 12/04/2015   Lab Results  Component Value Date   WBC 2.1* 12/17/2015   HGB 13.1 12/17/2015   HCT 39.8 12/17/2015   MCV 107.0* 12/17/2015   PLT 53* 12/17/2015   Lab Results  Component Value Date   NA 137 12/17/2015   K 4.2 12/17/2015   CO2 18* 12/17/2015   GLUCOSE 107* 12/17/2015   BUN 23* 12/17/2015   CREATININE 1.51* 12/17/2015   BILITOT 1.4* 12/04/2015   ALKPHOS 162* 12/04/2015   AST 75* 12/04/2015   ALT 33 12/04/2015   PROT 7.8 12/04/2015    ALBUMIN 4.0 12/04/2015   CALCIUM 9.0 12/17/2015   ANIONGAP 14 12/17/2015   GFR 71.70 12/04/2015   Lab Results  Component Value Date   CHOL 163 12/04/2015   Lab Results  Component Value Date   HDL 69.90 12/04/2015   Lab Results  Component Value Date   LDLCALC 77 12/04/2015   Lab Results  Component Value Date   TRIG 80.0 12/04/2015   Lab Results  Component Value Date   CHOLHDL 2 12/04/2015   Lab Results  Component Value Date   HGBA1C 5.4 03/02/2015       Assessment & Plan:   Problem List Items Addressed This Visit    Diarrhea    Is doing worse again since increa. He is trying to maintain the increased dose Selexipag but also experiencing more flushing.       Relevant Orders   Ambulatory referral to Pulmonology   Comprehensive metabolic panel   GERD (gastroesophageal reflux disease)    Avoid offending foods, start probiotics. Do not eat large meals in late evening and consider raising head of bed.       Hypertension    Well controlled, no changes to meds. Encouraged heart healthy diet such as the DASH diet and exercise as tolerated.       Relevant Orders   Ambulatory referral to Pulmonology   Comprehensive metabolic panel   Hypothyroidism    Well controlled, no changes to meds. Encouraged heart healthy diet such as the DASH diet and exercise as tolerated.       Relevant Orders   Ambulatory referral to Pulmonology   Comprehensive metabolic panel   OSA (obstructive sleep apnea)    Has not been using CPAP due to uncomfortable with mask will refer bac to pulmonology       Other Visit Diagnoses    Sleep apnea        Relevant Orders    Ambulatory referral to Pulmonology    Comprehensive metabolic panel  I am having Mr. Kerschner maintain his loperamide, Macitentan, potassium chloride, losartan, Fluticasone Propionate, albuterol, furosemide, Selexipag, Selexipag, levothyroxine, omeprazole, verapamil, spironolactone, mometasone-formoterol, Tadalafil  (PAH), doxepin, and SYMAX-SR.  No orders of the defined types were placed in this encounter.     Penni Homans, MD

## 2016-03-03 NOTE — Assessment & Plan Note (Signed)
Well controlled, no changes to meds. Encouraged heart healthy diet such as the DASH diet and exercise as tolerated.

## 2016-03-11 ENCOUNTER — Ambulatory Visit (INDEPENDENT_AMBULATORY_CARE_PROVIDER_SITE_OTHER): Payer: Medicare Other | Admitting: Pulmonary Disease

## 2016-03-11 ENCOUNTER — Encounter: Payer: Self-pay | Admitting: Pulmonary Disease

## 2016-03-11 VITALS — BP 132/78 | HR 77 | Ht 72.0 in | Wt 203.8 lb

## 2016-03-11 DIAGNOSIS — I272 Other secondary pulmonary hypertension: Secondary | ICD-10-CM

## 2016-03-11 DIAGNOSIS — J42 Unspecified chronic bronchitis: Secondary | ICD-10-CM

## 2016-03-11 DIAGNOSIS — R0600 Dyspnea, unspecified: Secondary | ICD-10-CM | POA: Diagnosis not present

## 2016-03-11 NOTE — Patient Instructions (Signed)
Will arrange for overnight oxygen test  Follow up in 6 months

## 2016-03-11 NOTE — Progress Notes (Signed)
Current Outpatient Prescriptions on File Prior to Visit  Medication Sig  . albuterol (PROVENTIL HFA;VENTOLIN HFA) 108 (90 BASE) MCG/ACT inhaler Inhale 2 puffs into the lungs every 6 (six) hours as needed for wheezing or shortness of breath.  . doxepin (SINEQUAN) 10 MG capsule TAKE 1 TO 2 CAPSULES AT BEDTIME (Patient taking differently: TAKE 1 TO 2 CAPSULES AT BEDTIME as needed for sleep)  . Fluticasone Propionate 0.05 % LOTN Apply daily to affected area (Patient taking differently: Apply 1 application topically daily. Apply daily to affected area)  . furosemide (LASIX) 40 MG tablet Take 40 mg by mouth every Monday, Wednesday, and Friday.   . levothyroxine (SYNTHROID, LEVOTHROID) 25 MCG tablet Take 1 tablet (25 mcg total) by mouth daily.  . loperamide (ANTI-DIARRHEAL) 2 MG capsule Take 4 mg by mouth daily.   . losartan (COZAAR) 50 MG tablet Take 1 tablet (50 mg total) by mouth daily.  . Macitentan (OPSUMIT) 10 MG TABS Take 10 mg by mouth daily.  . mometasone-formoterol (DULERA) 100-5 MCG/ACT AERO Inhale 2 puffs into the lungs daily.   . omeprazole (PRILOSEC) 40 MG capsule Take 1 capsule (40 mg total) by mouth daily.  . potassium chloride (K-DUR) 10 MEQ tablet Take 1 tablet (10 mEq total) by mouth daily. (Patient taking differently: Take 10 mEq by mouth 2 (two) times daily. )  . Selexipag (UPTRAVI) 1000 MCG TABS Take 1,000 mg by mouth every morning. (Patient taking differently: Take 1,000 mg by mouth at bedtime. )  . Selexipag (UPTRAVI) 800 MCG TABS Take 1 tablet (800 mcg total) by mouth every evening. (Patient taking differently: Take 800 mcg by mouth daily. )  . spironolactone (ALDACTONE) 50 MG tablet Take 1 tablet (50 mg total) by mouth daily.  . SYMAX-SR 0.375 MG 12 hr tablet TAKE 1 TABLET TWICE A DAY  . Tadalafil, PAH, (ADCIRCA) 20 MG TABS TAKE 2 TABLETS (40 MG TOTAL) DAILY  . verapamil (VERELAN PM) 180 MG 24 hr capsule Take 1 capsule (180 mg total) by mouth at bedtime. (Patient taking  differently: Take 180 mg by mouth daily. )  . Zoster Vaccine Live, PF, (ZOSTAVAX) 19400 UNT/0.65ML injection Inject 19,400 Units into the skin once.   No current facility-administered medications on file prior to visit.     Chief Complaint  Patient presents with  . Follow-up    Pt states that breathing is unchanged since last OV. Reports no issues with any of his current meds. Per last OV, noted that patient is on Advair; Dulera is on medlist and pt reports using Dulera, no notes of med change in chart.      Tests PFT 12/27/12 >> FEV1 2.06 (59%), FEV1% 89, TLC 4.50 (61%), DLCO 53%, no BD V/Q scan 12/29/12 >> low probability PE HST 03/06/15 >> AHI 12, SaO2 low 82% Echo 04/17/15 >> EF 65 to 70%, grade 1 diastolic dysfx, mod AI, PAS 64 mmHg ONO with CPAP and RA 07/29/15 >> Test time 3 hrs 31 min. Mean SpO2 93.3%, low SpO2 82%. Spent 4 min 40 sec with SpO2 < 88%. PFT 12/13/15 >> FEV1 2.05 (61%), FEV1% 83, TLC 4.21 (55%), DLCO 44%, no BD Echo 12/17/15 >> mild LVH, grade 1 diastolic dysfx, PAS 84 mmHg Rt heart cath 12/21/15 >> RA 11, RV 80/16/19, PA 83/61, PAOP 9, CI 2.6, PVR 10.3 CT chest 12/24/15 >> ATX at bases, 8 mm nodule LLL stable since 2014, cirrhosis with splenomegaly   Past medical hx HTN, CKD, GERD, Gout, Hypothyroidism, Seizures, Alcoholic   cirrhosis, Leukopenia/Thrombocytopenia with splenomegaly  Past surgical hx, Allergies, Family hx, Social hx all reviewed.  Vital Signs BP 132/78 mmHg  Pulse 77  Ht 6' (1.829 m)  Wt 203 lb 12.8 oz (92.443 kg)  BMI 27.63 kg/m2  SpO2 97%  History of Present Illness Darren Mueller is a 65 y.o. male former smoker with OSA, COPD with chronic bronchitis, alcoholic cirrhosis, and secondary pulmonary hypertension.  He has been using dulera in the morning.  He feels like this helps.  He is not having cough, wheeze, sputum, fever, leg swelling, abdominal bloating or chest pain.  He does okay with activity at a steady pace, but will get winded if he  pushes himself too much.  He doesn't feel like he has any issues with his sleep, or breathing while asleep.    Since his last visit he had CT chest which showed mild ATX and stable lung nodules.  He also had Echo and RHC as detailed above.  He was scheduled to have ONO with room air >> It does not appear this was every actually done.  Physical Exam  General - No distress ENT - No sinus tenderness, no oral exudate, no LAN Cardiac - s1s2 regular, no murmur Chest - No wheeze/rales/dullness Back - No focal tenderness Abd - Soft, non-tender Ext - No edema Neuro - Normal strength Skin - No rashes Psych - normal mood, and behavior   Assessment/Plan  COPD with chronic bronchitis. - continue dulera - prn proair  Pulmonary hypertension in setting of OSA, COPD, Alcoholic cirrhosis. - he is on macitentan, adcirca, and selexipag per cardiology - unable to tolerate CPAP >> will arrange for ONO on RA again - explained that he might need supplemental oxygen at night or reconsider using CPAP if his ONO shows significant oxygen desaturation   Patient Instructions  Will arrange for overnight oxygen test  Follow up in 6 months     Chesley Mires, MD [facility]/Sleep Pager:  [phone] 03/11/2016, 10:22 AM

## 2016-03-17 ENCOUNTER — Telehealth: Payer: Self-pay | Admitting: Pulmonary Disease

## 2016-03-17 NOTE — Telephone Encounter (Signed)
Will forward to VS as FYI

## 2016-03-18 NOTE — Telephone Encounter (Signed)
Noted.

## 2016-03-22 ENCOUNTER — Other Ambulatory Visit (HOSPITAL_COMMUNITY): Payer: Self-pay | Admitting: Internal Medicine

## 2016-03-26 ENCOUNTER — Encounter (HOSPITAL_COMMUNITY): Payer: Self-pay | Admitting: *Deleted

## 2016-03-26 NOTE — Progress Notes (Signed)
Pt denies any acute cardiopulmonary issues. Pt stated that he was instructed by medical staff, that he can drink liquids up until 8:00 A.M DOS. Reviewed pt history with Dr. Al Corpus, Anesthesia; MD suggested that the pt may have to stay over, given the history of OSA ( pt made aware). Pt made aware to stop taking Aspirin, vitamins, fish oil and any herbal medications. Do not take any NSAIDs ie: Ibuprofen, Advil, Naproxen, BC and Goody Powder or any medication containing Aspirin. Pt verbalized understanding of all pre-op instructions.

## 2016-03-27 ENCOUNTER — Encounter (HOSPITAL_COMMUNITY)
Admission: RE | Disposition: A | Discharge status: Home / Self Care | Payer: Self-pay | Source: Ambulatory Visit | Attending: Gastroenterology

## 2016-03-27 ENCOUNTER — Ambulatory Visit (HOSPITAL_COMMUNITY): Payer: Medicare Other | Admitting: Anesthesiology

## 2016-03-27 ENCOUNTER — Other Ambulatory Visit (HOSPITAL_COMMUNITY): Payer: Self-pay | Admitting: Adult Health

## 2016-03-27 ENCOUNTER — Ambulatory Visit (HOSPITAL_COMMUNITY)
Admission: RE | Admit: 2016-03-27 | Discharge: 2016-03-27 | Disposition: A | Discharge status: Home / Self Care | Payer: Medicare Other | Source: Ambulatory Visit | Attending: Gastroenterology | Admitting: Gastroenterology

## 2016-03-27 ENCOUNTER — Telehealth: Payer: Self-pay | Admitting: Gastroenterology

## 2016-03-27 ENCOUNTER — Encounter (HOSPITAL_COMMUNITY): Payer: Self-pay | Admitting: *Deleted

## 2016-03-27 DIAGNOSIS — N189 Chronic kidney disease, unspecified: Secondary | ICD-10-CM | POA: Insufficient documentation

## 2016-03-27 DIAGNOSIS — G4733 Obstructive sleep apnea (adult) (pediatric): Secondary | ICD-10-CM | POA: Diagnosis not present

## 2016-03-27 DIAGNOSIS — K766 Portal hypertension: Secondary | ICD-10-CM | POA: Diagnosis not present

## 2016-03-27 DIAGNOSIS — I272 Other secondary pulmonary hypertension: Secondary | ICD-10-CM | POA: Insufficient documentation

## 2016-03-27 DIAGNOSIS — K3189 Other diseases of stomach and duodenum: Secondary | ICD-10-CM | POA: Insufficient documentation

## 2016-03-27 DIAGNOSIS — E039 Hypothyroidism, unspecified: Secondary | ICD-10-CM | POA: Diagnosis not present

## 2016-03-27 DIAGNOSIS — R197 Diarrhea, unspecified: Secondary | ICD-10-CM

## 2016-03-27 DIAGNOSIS — J449 Chronic obstructive pulmonary disease, unspecified: Secondary | ICD-10-CM | POA: Insufficient documentation

## 2016-03-27 DIAGNOSIS — I129 Hypertensive chronic kidney disease with stage 1 through stage 4 chronic kidney disease, or unspecified chronic kidney disease: Secondary | ICD-10-CM | POA: Diagnosis not present

## 2016-03-27 DIAGNOSIS — I8500 Esophageal varices without bleeding: Secondary | ICD-10-CM | POA: Diagnosis not present

## 2016-03-27 DIAGNOSIS — K219 Gastro-esophageal reflux disease without esophagitis: Secondary | ICD-10-CM | POA: Diagnosis not present

## 2016-03-27 DIAGNOSIS — K7030 Alcoholic cirrhosis of liver without ascites: Secondary | ICD-10-CM | POA: Diagnosis not present

## 2016-03-27 DIAGNOSIS — Z7951 Long term (current) use of inhaled steroids: Secondary | ICD-10-CM | POA: Diagnosis not present

## 2016-03-27 DIAGNOSIS — I8510 Secondary esophageal varices without bleeding: Secondary | ICD-10-CM | POA: Insufficient documentation

## 2016-03-27 DIAGNOSIS — Z87891 Personal history of nicotine dependence: Secondary | ICD-10-CM | POA: Diagnosis not present

## 2016-03-27 DIAGNOSIS — Z79899 Other long term (current) drug therapy: Secondary | ICD-10-CM | POA: Insufficient documentation

## 2016-03-27 HISTORY — DX: Sleep apnea, unspecified: G47.30

## 2016-03-27 HISTORY — DX: Cardiac murmur, unspecified: R01.1

## 2016-03-27 HISTORY — PX: ESOPHAGOGASTRODUODENOSCOPY (EGD) WITH PROPOFOL: SHX5813

## 2016-03-27 SURGERY — ESOPHAGOGASTRODUODENOSCOPY (EGD) WITH PROPOFOL
Anesthesia: Monitor Anesthesia Care

## 2016-03-27 MED ORDER — PROPOFOL 10 MG/ML IV BOLUS
INTRAVENOUS | Status: DC | PRN
  Administered 2016-03-27: 30 mg via INTRAVENOUS
  Administered 2016-03-27: 40 mg via INTRAVENOUS

## 2016-03-27 MED ORDER — SODIUM CHLORIDE 0.9 % IV SOLN: INTRAVENOUS | Status: DC

## 2016-03-27 MED ORDER — LACTATED RINGERS IV SOLN
INTRAVENOUS | Status: DC | PRN
  Administered 2016-03-27: 14:00:00 via INTRAVENOUS

## 2016-03-27 MED ORDER — PROPOFOL 500 MG/50ML IV EMUL
INTRAVENOUS | Status: DC | PRN
  Administered 2016-03-27: 60 ug/kg/min via INTRAVENOUS

## 2016-03-27 NOTE — Anesthesia Preprocedure Evaluation (Addendum)
Anesthesia Evaluation  Patient identified by MRN, date of birth, ID band Patient awake    Reviewed: Allergy & Precautions, NPO status , Patient's Chart, lab work & pertinent test results  Airway Mallampati: II  TM Distance: >3 FB Neck ROM: Full    Dental   Pulmonary sleep apnea , COPD, former smoker,    breath sounds clear to auscultation       Cardiovascular hypertension, Pt. on medications  Rhythm:Regular Rate:Normal     Neuro/Psych Seizures -,     GI/Hepatic Neg liver ROS, GERD  ,  Endo/Other  Hypothyroidism   Renal/GU Renal InsufficiencyRenal disease     Musculoskeletal   Abdominal   Peds  Hematology  (+) anemia ,   Anesthesia Other Findings   Reproductive/Obstetrics                            Anesthesia Physical Anesthesia Plan  ASA: III  Anesthesia Plan: MAC   Post-op Pain Management:    Induction: Intravenous  Airway Management Planned: Natural Airway and Nasal Cannula  Additional Equipment:   Intra-op Plan:   Post-operative Plan:   Informed Consent: I have reviewed the patients History and Physical, chart, labs and discussed the procedure including the risks, benefits and alternatives for the proposed anesthesia with the patient or authorized representative who has indicated his/her understanding and acceptance.     Plan Discussed with: CRNA  Anesthesia Plan Comments:        Anesthesia Quick Evaluation

## 2016-03-27 NOTE — Discharge Instructions (Signed)
YOU HAD AN ENDOSCOPIC PROCEDURE TODAY: Refer to the procedure report that was given to you for any specific questions about what was found during the examination.  If the procedure report does not answer your questions, please call your gastroenterologist to clarify.  YOU SHOULD EXPECT: Some feelings of bloating in the abdomen. Passage of more gas than usual.  Walking can help get rid of the air that was put into your GI tract during the procedure and reduce the bloating. If you had a lower endoscopy (such as a colonoscopy or flexible sigmoidoscopy) you may notice spotting of blood in your stool or on the toilet paper.   DIET: Your first meal following the procedure should be a light meal and then it is ok to progress to your normal diet.  A half-sandwich or bowl of soup is an example of a good first meal.  Heavy or fried foods are harder to digest and may make you feel nasueas or bloated.  Drink plenty of fluids but you should avoid alcoholic beverages for 24 hours.  ACTIVITY: Your care partner should take you home directly after the procedure.  You should plan to take it easy, moving slowly for the rest of the day.  You can resume normal activity the day after the procedure however you should NOT DRIVE or use heavy machinery for 24 hours (because of the sedation medicines used during the test).    SYMPTOMS TO REPORT IMMEDIATELY  A gastroenterologist can be reached at any hour.  Please call your doctor's office for any of the following symptoms:   Following lower endoscopy (colonoscopy, flexible sigmoidoscopy)  Excessive amounts of blood in the stool  Significant tenderness, worsening of abdominal pains  Swelling of the abdomen that is new, acute  Fever of 100 or higher  Following upper endoscopy (EGD, EUS, ERCP)  Vomiting of blood or coffee ground material  New, significant abdominal pain  New, significant chest pain or pain under the shoulder blades  Painful or persistently difficult  swallowing  New shortness of breath  Black, tarry-looking stools  FOLLOW UP: If any biopsies were taken you will be contacted by phone or by letter within the next 1-3 weeks.  Call your gastroenterologist if you have not heard about the biopsies in 3 weeks.  Please also call your gastroenterologist's office with any specific questions about appointments or follow up tests.

## 2016-03-27 NOTE — H&P (View-Only) (Signed)
Current Outpatient Prescriptions on File Prior to Visit  Medication Sig  . albuterol (PROVENTIL HFA;VENTOLIN HFA) 108 (90 BASE) MCG/ACT inhaler Inhale 2 puffs into the lungs every 6 (six) hours as needed for wheezing or shortness of breath.  . doxepin (SINEQUAN) 10 MG capsule TAKE 1 TO 2 CAPSULES AT BEDTIME (Patient taking differently: TAKE 1 TO 2 CAPSULES AT BEDTIME as needed for sleep)  . Fluticasone Propionate 0.05 % LOTN Apply daily to affected area (Patient taking differently: Apply 1 application topically daily. Apply daily to affected area)  . furosemide (LASIX) 40 MG tablet Take 40 mg by mouth every Monday, Wednesday, and Friday.   . levothyroxine (SYNTHROID, LEVOTHROID) 25 MCG tablet Take 1 tablet (25 mcg total) by mouth daily.  Marland Kitchen loperamide (ANTI-DIARRHEAL) 2 MG capsule Take 4 mg by mouth daily.   Marland Kitchen losartan (COZAAR) 50 MG tablet Take 1 tablet (50 mg total) by mouth daily.  . Macitentan (OPSUMIT) 10 MG TABS Take 10 mg by mouth daily.  . mometasone-formoterol (DULERA) 100-5 MCG/ACT AERO Inhale 2 puffs into the lungs daily.   Marland Kitchen omeprazole (PRILOSEC) 40 MG capsule Take 1 capsule (40 mg total) by mouth daily.  . potassium chloride (K-DUR) 10 MEQ tablet Take 1 tablet (10 mEq total) by mouth daily. (Patient taking differently: Take 10 mEq by mouth 2 (two) times daily. )  . Selexipag (UPTRAVI) 1000 MCG TABS Take 1,000 mg by mouth every morning. (Patient taking differently: Take 1,000 mg by mouth at bedtime. )  . Selexipag (UPTRAVI) 800 MCG TABS Take 1 tablet (800 mcg total) by mouth every evening. (Patient taking differently: Take 800 mcg by mouth daily. )  . spironolactone (ALDACTONE) 50 MG tablet Take 1 tablet (50 mg total) by mouth daily.  Marland Kitchen SYMAX-SR 0.375 MG 12 hr tablet TAKE 1 TABLET TWICE A DAY  . Tadalafil, PAH, (ADCIRCA) 20 MG TABS TAKE 2 TABLETS (40 MG TOTAL) DAILY  . verapamil (VERELAN PM) 180 MG 24 hr capsule Take 1 capsule (180 mg total) by mouth at bedtime. (Patient taking  differently: Take 180 mg by mouth daily. )  . Zoster Vaccine Live, PF, (ZOSTAVAX) 83291 UNT/0.65ML injection Inject 19,400 Units into the skin once.   No current facility-administered medications on file prior to visit.     Chief Complaint  Patient presents with  . Follow-up    Pt states that breathing is unchanged since last OV. Reports no issues with any of his current meds. Per last OV, noted that patient is on Advair; Ruthe Mannan is on medlist and pt reports using Dulera, no notes of med change in chart.      Tests PFT 12/27/12 >> FEV1 2.06 (59%), FEV1% 89, TLC 4.50 (61%), DLCO 53%, no BD V/Q scan 12/29/12 >> low probability PE HST 03/06/15 >> AHI 12, SaO2 low 82% Echo 04/17/15 >> EF 65 to 91%, grade 1 diastolic dysfx, mod AI, PAS 64 mmHg ONO with CPAP and RA 07/29/15 >> Test time 3 hrs 31 min. Mean SpO2 93.3%, low SpO2 82%. Spent 4 min 40 sec with SpO2 < 88%. PFT 12/13/15 >> FEV1 2.05 (61%), FEV1% 83, TLC 4.21 (55%), DLCO 44%, no BD Echo 12/17/15 >> mild LVH, grade 1 diastolic dysfx, PAS 84 mmHg Rt heart cath 12/21/15 >> RA 11, RV 80/16/19, PA 83/61, PAOP 9, CI 2.6, PVR 10.3 CT chest 12/24/15 >> ATX at bases, 8 mm nodule LLL stable since 2014, cirrhosis with splenomegaly   Past medical hx HTN, CKD, GERD, Gout, Hypothyroidism, Seizures, Alcoholic  cirrhosis, Leukopenia/Thrombocytopenia with splenomegaly  Past surgical hx, Allergies, Family hx, Social hx all reviewed.  Vital Signs BP 132/78 mmHg  Pulse 77  Ht 6' (1.829 m)  Wt 203 lb 12.8 oz (92.443 kg)  BMI 27.63 kg/m2  SpO2 97%  History of Present Illness Darren Mueller is a 65 y.o. male former smoker with OSA, COPD with chronic bronchitis, alcoholic cirrhosis, and secondary pulmonary hypertension.  He has been using dulera in the morning.  He feels like this helps.  He is not having cough, wheeze, sputum, fever, leg swelling, abdominal bloating or chest pain.  He does okay with activity at a steady pace, but will get winded if he  pushes himself too much.  He doesn't feel like he has any issues with his sleep, or breathing while asleep.    Since his last visit he had CT chest which showed mild ATX and stable lung nodules.  He also had Echo and RHC as detailed above.  He was scheduled to have ONO with room air >> It does not appear this was every actually done.  Physical Exam  General - No distress ENT - No sinus tenderness, no oral exudate, no LAN Cardiac - s1s2 regular, no murmur Chest - No wheeze/rales/dullness Back - No focal tenderness Abd - Soft, non-tender Ext - No edema Neuro - Normal strength Skin - No rashes Psych - normal mood, and behavior   Assessment/Plan  COPD with chronic bronchitis. - continue dulera - prn proair  Pulmonary hypertension in setting of OSA, COPD, Alcoholic cirrhosis. - he is on macitentan, adcirca, and selexipag per cardiology - unable to tolerate CPAP >> will arrange for ONO on RA again - explained that he might need supplemental oxygen at night or reconsider using CPAP if his ONO shows significant oxygen desaturation   Patient Instructions  Will arrange for overnight oxygen test  Follow up in 6 months     Chesley Mires, MD [facility]/Sleep Pager:  [phone] 03/11/2016, 10:22 AM

## 2016-03-27 NOTE — Op Note (Signed)
[facility] Patient Name: Darren Mueller Procedure Date : 03/27/2016 MRN: [id number] Attending MD: Milus Banister , MD Date of Birth: July 29, 1951 CSN: [id number] Age: 65 Admit Type: Outpatient Procedure:                Upper GI endoscopy Indications:              Surveillance procedure, known cirrhosis, small                            varices 2012 Providers:                Milus Banister, MD, Carolynn Comment, RN, Corliss Parish, Technician Referring MD:              Medicines:                Monitored Anesthesia Care Complications:            No immediate complications. Estimated blood loss:                            None. Estimated Blood Loss:     Estimated blood loss: none. Procedure:                Pre-Anesthesia Assessment:                           - Prior to the procedure, a History and Physical                            was performed, and patient medications and                            allergies were reviewed. The patient's tolerance of                            previous anesthesia was also reviewed. The risks                            and benefits of the procedure and the sedation                            options and risks were discussed with the patient.                            All questions were answered, and informed consent                            was obtained. Prior Anticoagulants: The patient has                            taken no previous anticoagulant or antiplatelet                            agents. ASA Grade Assessment: III -  A patient with                            severe systemic disease. After reviewing the risks                            and benefits, the patient was deemed in                            satisfactory condition to undergo the procedure.                           After obtaining informed consent, the endoscope was                            passed under direct vision. Throughout the                             procedure, the patient's blood pressure, pulse, and                            oxygen saturations were monitored continuously. The                            EG-[id number] ([id number]) scope was introduced through the                            mouth, and advanced to the second part of duodenum.                            The upper GI endoscopy was accomplished without                            difficulty. The patient tolerated the procedure                            well. Scope In: Scope Out: Findings:      Small (< 5 mm) varices were found in the lower third of the esophagus.      Moderate portal hypertensive gastropathy was found in the entire       examined stomach.      The exam was otherwise without abnormality. Impression:               - Small (< 5 mm) esophageal varices. No change                            since last examination                           - Portal hypertensive gastropathy.                           - The examination was otherwise normal.                           -  No specimens collected. Moderate Sedation:      none Recommendation:           - Patient has a contact number available for                            emergencies. The signs and symptoms of potential                            delayed complications were discussed with the                            patient. Return to normal activities tomorrow.                            Written discharge instructions were provided to the                            patient.                           - Resume previous diet.                           - Continue present medications.                           - Repeat upper endoscopy in 3 years for                            surveillance.                           - Dr. Ardis Hughs' office will call to arrange return                            appointment in 6-8 weeks Procedure Code(s):        --- Professional ---                           [phone],  Esophagogastroduodenoscopy, flexible,                            transoral; diagnostic, including collection of                            specimen(s) by brushing or washing, when performed                            (separate procedure) Diagnosis Code(s):        --- Professional ---                           I85.00, Esophageal varices without bleeding                           K76.6, Portal hypertension  K31.89, Other diseases of stomach and duodenum CPT copyright 2016 [employer]. All rights reserved. The codes documented in this report are preliminary and upon coder review may  be revised to meet current compliance requirements. Milus Banister, MD 03/27/2016 2:20:51 PM This report has been signed electronically. Number of Addenda: 0

## 2016-03-27 NOTE — Interval H&P Note (Signed)
History and Physical Interval Note:  03/27/2016 1:16 PM  Darren Mueller  has presented today for surgery, with the diagnosis of varicies screening  The various methods of treatment have been discussed with the patient and family. After consideration of risks, benefits and other options for treatment, the patient has consented to  Procedure(s): ESOPHAGOGASTRODUODENOSCOPY (EGD) WITH PROPOFOL (N/A) as a surgical intervention .  The patient's history has been reviewed, patient examined, no change in status, stable for surgery.  I have reviewed the patient's chart and labs.  Questions were answered to the patient's satisfaction.     Milus Banister

## 2016-03-27 NOTE — Telephone Encounter (Signed)
Pt daughter was notified per pt request that recall is due in 3 years for Endo and follow up appt in 6-8 weeks need to be scheduled.  All questions were answered.

## 2016-03-27 NOTE — Transfer of Care (Signed)
Immediate Anesthesia Transfer of Care Note  Patient: Darren Mueller  Procedure(s) Performed: Procedure(s): ESOPHAGOGASTRODUODENOSCOPY (EGD) WITH PROPOFOL (N/A)  Patient Location: PACU  Anesthesia Type:MAC  Level of Consciousness: awake  Airway & Oxygen Therapy: Patient Spontanous Breathing  Post-op Assessment: Report given to RN and Post -op Vital signs reviewed and stable  Post vital signs: Reviewed and stable  Last Vitals:  Filed Vitals:   03/27/16 1249 03/27/16 1403  BP: 142/76 119/80  Pulse: 90 77  Temp: 36.4 C 36.6 C  Resp: 15 28    Last Pain: There were no vitals filed for this visit.       Complications: No apparent anesthesia complications

## 2016-03-28 ENCOUNTER — Telehealth: Payer: Self-pay | Admitting: Gastroenterology

## 2016-03-28 ENCOUNTER — Encounter (HOSPITAL_COMMUNITY): Payer: Self-pay | Admitting: Gastroenterology

## 2016-03-28 NOTE — Anesthesia Postprocedure Evaluation (Signed)
Anesthesia Post Note  Patient: Darren Mueller  Procedure(s) Performed: Procedure(s) (LRB): ESOPHAGOGASTRODUODENOSCOPY (EGD) WITH PROPOFOL (N/A)  Patient location during evaluation: PACU Anesthesia Type: MAC Level of consciousness: awake and alert Pain management: pain level controlled Vital Signs Assessment: post-procedure vital signs reviewed and stable Respiratory status: spontaneous breathing, nonlabored ventilation, respiratory function stable and patient connected to nasal cannula oxygen Cardiovascular status: stable and blood pressure returned to baseline Anesthetic complications: yes Anesthetic complication details: Right front tooth chip from suctioning during obstruction event.   Last Vitals:  Filed Vitals:   03/27/16 1420 03/27/16 1430  BP: 122/72 123/87  Pulse: 73 76  Temp:    Resp: 22 17    Last Pain: There were no vitals filed for this visit.               Tiajuana Amass

## 2016-03-28 NOTE — Telephone Encounter (Signed)
Darren Mueller

## 2016-04-07 ENCOUNTER — Telehealth (HOSPITAL_COMMUNITY): Payer: Self-pay | Admitting: *Deleted

## 2016-04-07 NOTE — Telephone Encounter (Signed)
Received signed ROI from [place] in [place] requesting pt's records, records faxed to them at [phone]

## 2016-04-14 ENCOUNTER — Encounter (HOSPITAL_COMMUNITY): Payer: Self-pay | Admitting: *Deleted

## 2016-04-14 ENCOUNTER — Observation Stay (HOSPITAL_COMMUNITY)
Admission: EM | Admit: 2016-04-14 | Discharge: 2016-04-16 | Disposition: A | Discharge status: Home / Self Care | Payer: Medicare Other | Attending: Internal Medicine | Admitting: Internal Medicine

## 2016-04-14 ENCOUNTER — Emergency Department (HOSPITAL_COMMUNITY): Payer: Medicare Other

## 2016-04-14 DIAGNOSIS — R059 Cough, unspecified: Secondary | ICD-10-CM

## 2016-04-14 DIAGNOSIS — I5023 Acute on chronic systolic (congestive) heart failure: Secondary | ICD-10-CM | POA: Diagnosis present

## 2016-04-14 DIAGNOSIS — D61818 Other pancytopenia: Secondary | ICD-10-CM | POA: Diagnosis not present

## 2016-04-14 DIAGNOSIS — R069 Unspecified abnormalities of breathing: Secondary | ICD-10-CM | POA: Diagnosis not present

## 2016-04-14 DIAGNOSIS — I5033 Acute on chronic diastolic (congestive) heart failure: Secondary | ICD-10-CM | POA: Diagnosis not present

## 2016-04-14 DIAGNOSIS — R072 Precordial pain: Secondary | ICD-10-CM | POA: Insufficient documentation

## 2016-04-14 DIAGNOSIS — E876 Hypokalemia: Secondary | ICD-10-CM | POA: Insufficient documentation

## 2016-04-14 DIAGNOSIS — I10 Essential (primary) hypertension: Secondary | ICD-10-CM

## 2016-04-14 DIAGNOSIS — G40909 Epilepsy, unspecified, not intractable, without status epilepticus: Secondary | ICD-10-CM | POA: Diagnosis not present

## 2016-04-14 DIAGNOSIS — G4733 Obstructive sleep apnea (adult) (pediatric): Secondary | ICD-10-CM | POA: Diagnosis not present

## 2016-04-14 DIAGNOSIS — R079 Chest pain, unspecified: Secondary | ICD-10-CM | POA: Insufficient documentation

## 2016-04-14 DIAGNOSIS — I5022 Chronic systolic (congestive) heart failure: Secondary | ICD-10-CM

## 2016-04-14 DIAGNOSIS — I110 Hypertensive heart disease with heart failure: Secondary | ICD-10-CM | POA: Diagnosis not present

## 2016-04-14 DIAGNOSIS — K219 Gastro-esophageal reflux disease without esophagitis: Secondary | ICD-10-CM | POA: Insufficient documentation

## 2016-04-14 DIAGNOSIS — R0602 Shortness of breath: Secondary | ICD-10-CM | POA: Diagnosis not present

## 2016-04-14 DIAGNOSIS — I509 Heart failure, unspecified: Secondary | ICD-10-CM | POA: Diagnosis not present

## 2016-04-14 DIAGNOSIS — J449 Chronic obstructive pulmonary disease, unspecified: Secondary | ICD-10-CM | POA: Insufficient documentation

## 2016-04-14 DIAGNOSIS — Z87891 Personal history of nicotine dependence: Secondary | ICD-10-CM | POA: Diagnosis not present

## 2016-04-14 DIAGNOSIS — I272 Other secondary pulmonary hypertension: Secondary | ICD-10-CM | POA: Insufficient documentation

## 2016-04-14 DIAGNOSIS — I5043 Acute on chronic combined systolic (congestive) and diastolic (congestive) heart failure: Secondary | ICD-10-CM | POA: Diagnosis not present

## 2016-04-14 DIAGNOSIS — E038 Other specified hypothyroidism: Secondary | ICD-10-CM

## 2016-04-14 DIAGNOSIS — R05 Cough: Secondary | ICD-10-CM

## 2016-04-14 DIAGNOSIS — E039 Hypothyroidism, unspecified: Secondary | ICD-10-CM | POA: Diagnosis not present

## 2016-04-14 DIAGNOSIS — K7460 Unspecified cirrhosis of liver: Secondary | ICD-10-CM | POA: Insufficient documentation

## 2016-04-14 DIAGNOSIS — I2721 Secondary pulmonary arterial hypertension: Secondary | ICD-10-CM | POA: Insufficient documentation

## 2016-04-14 DIAGNOSIS — IMO0002 Reserved for concepts with insufficient information to code with codable children: Secondary | ICD-10-CM | POA: Diagnosis present

## 2016-04-14 DIAGNOSIS — R0789 Other chest pain: Secondary | ICD-10-CM

## 2016-04-14 LAB — CBC
HEMATOCRIT: 36.9 % — AB (ref 39.0–52.0)
HEMOGLOBIN: 12.3 g/dL — AB (ref 13.0–17.0)
MCH: 34.2 pg — AB (ref 26.0–34.0)
MCHC: 33.3 g/dL (ref 30.0–36.0)
MCV: 102.5 fL — ABNORMAL HIGH (ref 78.0–100.0)
PLATELETS: 46 10*3/uL — AB (ref 150–400)
RBC: 3.6 MIL/uL — AB (ref 4.22–5.81)
RDW: 15.6 % — ABNORMAL HIGH (ref 11.5–15.5)
WBC: 2.6 10*3/uL — ABNORMAL LOW (ref 4.0–10.5)

## 2016-04-14 LAB — TROPONIN I: Troponin I: <0.03 ng/mL (ref <0.031)

## 2016-04-14 LAB — BASIC METABOLIC PANEL
ANION GAP: 9 (ref 5–15)
BUN: 12 mg/dL (ref 6–20)
CALCIUM: 8.8 mg/dL — AB (ref 8.9–10.3)
CHLORIDE: 108 mmol/L (ref 101–111)
CO2: 19 mmol/L — AB (ref 22–32)
Creatinine, Ser: 1.39 mg/dL — ABNORMAL HIGH (ref 0.61–1.24)
GFR calc non Af Amer: 51 mL/min — ABNORMAL LOW (ref >60)
GFR, EST AFRICAN AMERICAN: 59 mL/min — AB (ref >60)
Glucose, Bld: 87 mg/dL (ref 65–99)
POTASSIUM: 3.1 mmol/L — AB (ref 3.5–5.1)
Sodium: 136 mmol/L (ref 135–145)

## 2016-04-14 LAB — BRAIN NATRIURETIC PEPTIDE: B Natriuretic Peptide: 543.9 pg/mL — ABNORMAL HIGH (ref 0.0–100.0)

## 2016-04-14 LAB — D-DIMER, QUANTITATIVE (NOT AT ARMC): D DIMER QUANT: 0.27 ug{FEU}/mL (ref 0.00–0.50)

## 2016-04-14 LAB — I-STAT TROPONIN, ED: TROPONIN I, POC: 0.02 ng/mL (ref 0.00–0.08)

## 2016-04-14 MED ORDER — LEVOTHYROXINE SODIUM 25 MCG PO TABS
25.0000 ug | ORAL_TABLET | Freq: Every day | ORAL | Status: DC
  Administered 2016-04-14 – 2016-04-16 (×3): 25 ug via ORAL
  Filled 2016-04-14 (×4): qty 1

## 2016-04-14 MED ORDER — POTASSIUM CHLORIDE CRYS ER 20 MEQ PO TBCR
40.0000 meq | EXTENDED_RELEASE_TABLET | Freq: Once | ORAL | Status: AC
  Administered 2016-04-14: 40 meq via ORAL
  Filled 2016-04-14: qty 2

## 2016-04-14 MED ORDER — SELEXIPAG 1000 MCG PO TABS
1000.0000 mg | ORAL_TABLET | Freq: Every morning | ORAL | Status: DC
  Administered 2016-04-15: 1000 mg via ORAL
  Filled 2016-04-14: qty 1000

## 2016-04-14 MED ORDER — MACITENTAN 10 MG PO TABS
10.0000 mg | ORAL_TABLET | Freq: Every day | ORAL | Status: DC
  Filled 2016-04-14: qty 10

## 2016-04-14 MED ORDER — POTASSIUM CHLORIDE CRYS ER 20 MEQ PO TBCR
40.0000 meq | EXTENDED_RELEASE_TABLET | Freq: Every day | ORAL | Status: DC
  Administered 2016-04-14 – 2016-04-16 (×3): 40 meq via ORAL
  Filled 2016-04-14 (×5): qty 2

## 2016-04-14 MED ORDER — ALBUTEROL SULFATE (2.5 MG/3ML) 0.083% IN NEBU: 2.5000 mg | INHALATION_SOLUTION | Freq: Four times a day (QID) | RESPIRATORY_TRACT | Status: DC | PRN

## 2016-04-14 MED ORDER — PREDNISONE 20 MG PO TABS
60.0000 mg | ORAL_TABLET | Freq: Once | ORAL | Status: AC
  Administered 2016-04-14: 60 mg via ORAL
  Filled 2016-04-14: qty 3

## 2016-04-14 MED ORDER — NITROGLYCERIN 2 % TD OINT
1.0000 [in_us] | TOPICAL_OINTMENT | Freq: Once | TRANSDERMAL | Status: AC
  Administered 2016-04-14: 1 [in_us] via TOPICAL
  Filled 2016-04-14: qty 1

## 2016-04-14 MED ORDER — SELEXIPAG 1000 MCG PO TABS
1000.0000 mg | ORAL_TABLET | Freq: Every day | ORAL | Status: DC
  Administered 2016-04-14: 1000 mg via ORAL

## 2016-04-14 MED ORDER — ACETAMINOPHEN 325 MG PO TABS: 650.0000 mg | ORAL_TABLET | ORAL | Status: DC | PRN

## 2016-04-14 MED ORDER — SODIUM CHLORIDE 0.9% FLUSH
3.0000 mL | Freq: Two times a day (BID) | INTRAVENOUS | Status: DC
  Administered 2016-04-14 – 2016-04-15 (×2): 3 mL via INTRAVENOUS

## 2016-04-14 MED ORDER — SODIUM CHLORIDE 0.9% FLUSH: 3.0000 mL | INTRAVENOUS | Status: DC | PRN

## 2016-04-14 MED ORDER — MOMETASONE FURO-FORMOTEROL FUM 100-5 MCG/ACT IN AERO
2.0000 | INHALATION_SPRAY | Freq: Every day | RESPIRATORY_TRACT | Status: DC
  Administered 2016-04-14 – 2016-04-16 (×3): 2 via RESPIRATORY_TRACT
  Filled 2016-04-14 (×2): qty 8.8

## 2016-04-14 MED ORDER — HYOSCYAMINE SULFATE ER 0.375 MG PO TB12
0.3750 mg | ORAL_TABLET | Freq: Two times a day (BID) | ORAL | Status: DC
  Administered 2016-04-14 – 2016-04-16 (×5): 0.375 mg via ORAL
  Filled 2016-04-14 (×6): qty 1

## 2016-04-14 MED ORDER — DOXEPIN HCL 10 MG PO CAPS
10.0000 mg | ORAL_CAPSULE | Freq: Every evening | ORAL | Status: DC | PRN
  Administered 2016-04-14: 10 mg via ORAL
  Filled 2016-04-14 (×3): qty 1

## 2016-04-14 MED ORDER — SPIRONOLACTONE 25 MG PO TABS
50.0000 mg | ORAL_TABLET | Freq: Every day | ORAL | Status: DC
  Administered 2016-04-14 – 2016-04-16 (×3): 50 mg via ORAL
  Filled 2016-04-14 (×4): qty 2

## 2016-04-14 MED ORDER — POTASSIUM CHLORIDE 10 MEQ/100ML IV SOLN
10.0000 meq | Freq: Once | INTRAVENOUS | Status: AC
  Administered 2016-04-14: 10 meq via INTRAVENOUS
  Filled 2016-04-14: qty 100

## 2016-04-14 MED ORDER — PANTOPRAZOLE SODIUM 40 MG PO TBEC
40.0000 mg | DELAYED_RELEASE_TABLET | Freq: Every day | ORAL | Status: DC
  Administered 2016-04-14 – 2016-04-16 (×3): 40 mg via ORAL
  Filled 2016-04-14 (×3): qty 1

## 2016-04-14 MED ORDER — FUROSEMIDE 10 MG/ML IJ SOLN
20.0000 mg | Freq: Two times a day (BID) | INTRAMUSCULAR | Status: DC
  Administered 2016-04-14 – 2016-04-15 (×3): 20 mg via INTRAVENOUS
  Filled 2016-04-14 (×3): qty 2

## 2016-04-14 MED ORDER — SODIUM CHLORIDE 0.9 % IV SOLN: 250.0000 mL | INTRAVENOUS | Status: DC | PRN

## 2016-04-14 MED ORDER — ONDANSETRON HCL 4 MG/2ML IJ SOLN: 4.0000 mg | Freq: Four times a day (QID) | INTRAMUSCULAR | Status: DC | PRN

## 2016-04-14 MED ORDER — FUROSEMIDE 10 MG/ML IJ SOLN
INTRAMUSCULAR | Status: AC
  Filled 2016-04-14: qty 2

## 2016-04-14 MED ORDER — FUROSEMIDE 10 MG/ML IJ SOLN
40.0000 mg | Freq: Once | INTRAMUSCULAR | Status: AC
  Administered 2016-04-14: 40 mg via INTRAVENOUS
  Filled 2016-04-14: qty 4

## 2016-04-14 MED ORDER — MACITENTAN 10 MG PO TABS
10.0000 mg | ORAL_TABLET | Freq: Every day | ORAL | Status: DC
  Administered 2016-04-14 – 2016-04-16 (×3): 10 mg via ORAL
  Filled 2016-04-14 (×2): qty 10

## 2016-04-14 MED ORDER — SELEXIPAG 800 MCG PO TABS: 800.0000 ug | ORAL_TABLET | Freq: Every day | ORAL | Status: DC

## 2016-04-14 MED ORDER — TADALAFIL (PAH) 20 MG PO TABS
40.0000 mg | ORAL_TABLET | Freq: Every day | ORAL | Status: DC
  Administered 2016-04-14 – 2016-04-16 (×3): 40 mg via ORAL
  Filled 2016-04-14 (×4): qty 2

## 2016-04-14 NOTE — ED Notes (Addendum)
Pt arrives via EMS From home. Pt was outside grilling around 12:15pm. Sudden onset CP/SOB, used inhaler. Some relief from inhaler. Hx pulmonary HTN. Initial BP 180/110. 12 lead global depression. 95% on RA, 4L [place] applied. Sats up to 98%. Pt started feeling better on O2.

## 2016-04-14 NOTE — Care Management Note (Signed)
Case Management Note  Patient Details  Name: Micael Barb MRN: [id number] Date of Birth: 1950-11-27  Subjective/Objective:                  65 y.o. male brought in by ambulance, who has a PMHx of pulmonary HTN, seizures, GERD, hypopotassemia, COPD, presents to the Emergency Department complaining of sudden onset, gradual worsening, intermittent sharp chest pain onset about 2 hours ago. He note associated shortness of breath. / From home alone.  Action/Plan: Follow for disposition needs.   Expected Discharge Date:  04/16/16               Expected Discharge Plan:  [place]  In-House Referral:     Discharge planning Services  CM Consult  Post Acute Care Choice:    Choice offered to:     DME Arranged:    DME Agency:     HH Arranged:    [place] Agency:     Status of Service:  In process, will continue to follow  Medicare Important Message Given:    Date Medicare IM Given:    Medicare IM give by:    Date Additional Medicare IM Given:    Additional Medicare Important Message give by:     If discussed at [place] of Stay Meetings, dates discussed:    Additional Comments:  Fuller Mandril, RN 04/14/2016, 9:36 AM

## 2016-04-14 NOTE — H&P (Signed)
History and Physical  Nelvin Tomb ZMC:[id number] DOB: Jul 30, 1951 DOA: 04/14/2016   PCP: Penni Homans, MD   Patient coming from: Home via EMS   Chief Complaint: Right sided chest pain   HPI: Darren Mueller is a 65 y.o. male with medical history significant for pHTN, right sided heart failure who is being admitted for chest pain and acute heart failure exacerbation. He provides most of the history which is also supplemented by his son. He was in his usual state of health until 11:30pm last night 04/13/16 when he had sudden onset of right sided sharp chest pain associated with SOB. Took a puff of albuterol inhaler which helped pain and SOB momentarily, but both came back after a couple minutes. Pain did not radiate, no aggravating symptoms. No fevers, chills, nausea recently. Some diarrhea the last few weeks as one of his pHTN medications has been titrated upwards, and he was told this is a known side effect.   ED Course: Found to have edema, elevated BNP and CXR with some congestion felt to be in heart failure. Also chest pain has now resolved with addition of nitropaste.  Review of Systems: Please see HPI for pertinent positives and negatives. A complete 10 system review of systems are otherwise negative.  Past Medical History  Diagnosis Date  . Hypertension   . Seizures ([place])   . Hx of colonic polyps   . GERD (gastroesophageal reflux disease)   . Gout   . Alcoholic cirrhosis of liver with ascites ([place]) 2014  . Unspecified hypothyroidism 01/25/2013  . Unspecified pleural effusion   . Other chronic pulmonary heart diseases   . Hypopotassemia   . Renal insufficiency 07/18/2013  . Diarrhea 09/04/2013  . Otalgia 11/28/2013  . Dermatitis 06/10/2015  . Alcohol dependence in remission ([place]) 03/22/2011    In remission   . Alcohol dependence ([place]) 03/22/2011    In remission   . Red eye 03/03/2016    right  . Sleep apnea     does not wear CPAP  . Heart murmur    Past Surgical History    Procedure Laterality Date  . Colonoscopy N/A 12/08/2013    Procedure: COLONOSCOPY;  Surgeon: Milus Banister, MD;  Location: [facility];  Service: Endoscopy;  Laterality: N/A;  . Right heart catheterization Right 06/06/2014    Procedure: RIGHT HEART CATH;  Surgeon: Jolaine Artist, MD;  Location: [facility] CATH LAB;  Service: Cardiovascular;  Laterality: Right;  . Uvulopalatopharyngoplasty  1999  . Cardiac catheterization N/A 12/21/2015    Procedure: Right Heart Cath;  Surgeon: Jolaine Artist, MD;  Location: [place] CV LAB;  Service: Cardiovascular;  Laterality: N/A;  . Esophagogastroduodenoscopy (egd) with propofol N/A 03/27/2016    Procedure: ESOPHAGOGASTRODUODENOSCOPY (EGD) WITH PROPOFOL;  Surgeon: Milus Banister, MD;  Location: [place];  Service: Endoscopy;  Laterality: N/A;    Social History:  reports that he quit smoking about 36 years ago. His smoking use included Cigarettes. He has a 10 pack-year smoking history. He has never used smokeless tobacco. He reports that he drinks alcohol. He reports that he does not use illicit drugs.   Allergies  Allergen Reactions  . Aspirin Other (See Comments)    hypertension    Family History  Problem Relation Age of Onset  . Heart disease Mother   . Heart attack Mother   . Hypertension Mother   . Prostate cancer Father   . Colon cancer Neg Hx      Prior to Admission  medications   Medication Sig Start Date End Date Taking? Authorizing Provider  albuterol (PROVENTIL HFA;VENTOLIN HFA) 108 (90 BASE) MCG/ACT inhaler Inhale 2 puffs into the lungs every 6 (six) hours as needed for wheezing or shortness of breath.   Yes Historical Provider, MD  doxepin (SINEQUAN) 10 MG capsule TAKE 1 TO 2 CAPSULES AT BEDTIME Patient taking differently: TAKE 1 TO 2 CAPSULES AT BEDTIME as needed for sleep 01/16/16  Yes Mosie Lukes, MD  Fluticasone Propionate 0.05 % LOTN Apply daily to affected area Patient taking differently: Apply 1 application topically  daily. Apply daily to affected area 05/15/15  Yes Mosie Lukes, MD  furosemide (LASIX) 40 MG tablet Take 40 mg by mouth every Monday, Wednesday, and Friday.    Yes Historical Provider, MD  levothyroxine (SYNTHROID, LEVOTHROID) 25 MCG tablet Take 1 tablet (25 mcg total) by mouth daily. 12/04/15  Yes Mosie Lukes, MD  loperamide (ANTI-DIARRHEAL) 2 MG capsule Take 4 mg by mouth daily.    Yes Historical Provider, MD  losartan (COZAAR) 50 MG tablet TAKE 1 TABLET DAILY 03/27/16  Yes Amy D Clegg, NP  Macitentan (OPSUMIT) 10 MG TABS Take 10 mg by mouth daily. 03/23/14  Yes Shaune Pascal Bensimhon, MD  mometasone-formoterol (DULERA) 100-5 MCG/ACT AERO Inhale 2 puffs into the lungs daily.    Yes Historical Provider, MD  omeprazole (PRILOSEC) 40 MG capsule Take 1 capsule (40 mg total) by mouth daily. 12/04/15  Yes Mosie Lukes, MD  potassium chloride (K-DUR) 10 MEQ tablet Take 1 tablet (10 mEq total) by mouth daily. Patient taking differently: Take 10 mEq by mouth 2 (two) times daily.  12/25/14  Yes Larey Dresser, MD  Selexipag (UPTRAVI) 1000 MCG TABS Take 1,000 mg by mouth every morning. Patient taking differently: Take 1,000 mg by mouth at bedtime.  11/13/15  Yes Shaune Pascal Bensimhon, MD  Selexipag (UPTRAVI) 800 MCG TABS Take 1 tablet (800 mcg total) by mouth every evening. Patient taking differently: Take 800 mcg by mouth daily.  11/13/15  Yes Jolaine Artist, MD  spironolactone (ALDACTONE) 50 MG tablet Take 1 tablet (50 mg total) by mouth daily. 12/04/15  Yes Mosie Lukes, MD  SYMAX-SR 0.375 MG 12 hr tablet TAKE 1 TABLET TWICE A DAY 03/02/16  Yes Mosie Lukes, MD  Tadalafil, PAH, (ADCIRCA) 20 MG TABS TAKE 2 TABLETS (40 MG TOTAL) DAILY 01/01/16  Yes Jolaine Artist, MD  verapamil (VERELAN PM) 180 MG 24 hr capsule Take 1 capsule (180 mg total) by mouth at bedtime. Patient taking differently: Take 180 mg by mouth daily.  12/04/15  Yes Mosie Lukes, MD    Physical Exam: BP 152/99 mmHg  Pulse 84   Temp(Src) 97.7 F (36.5 C) (Oral)  Resp 18  SpO2 100%  General:  Alert, oriented, calm, in no acute distress  Eyes: pupils round and reactive to light and accomodation, clear sclerea Neck: supple, no masses, trachea mildline  Cardiovascular: RRR, no murmurs or rubs, he has 1plus nonpitting BLE edema Respiratory: clear to auscultation bilaterally, no wheezes, minimal basilar crackles  Abdomen: soft, nontender, nondistended, normal bowel tones heard  Skin: dry, no rashes  Musculoskeletal: no joint effusions, normal range of motion  Psychiatric: appropriate affect, normal speech  Neurologic: extraocular muscles intact, clear speech, moving all extremities with intact sensorium            Labs on Admission:  Basic Metabolic Panel:  Recent Labs Lab 04/14/16 0149  NA 136  K 3.1*  CL 108  CO2 19*  GLUCOSE 87  BUN 12  CREATININE 1.39*  CALCIUM 8.8*   Liver Function Tests: No results for input(s): AST, ALT, ALKPHOS, BILITOT, PROT, ALBUMIN in the last 168 hours. No results for input(s): LIPASE, AMYLASE in the last 168 hours. No results for input(s): AMMONIA in the last 168 hours. CBC:  Recent Labs Lab 04/14/16 0149  WBC 2.6*  HGB 12.3*  HCT 36.9*  MCV 102.5*  PLT 46*   Cardiac Enzymes: No results for input(s): CKTOTAL, CKMB, CKMBINDEX, TROPONINI in the last 168 hours.  BNP (last 3 results)  Recent Labs  04/17/15 1245 11/13/15 1015 04/14/16 0149  BNP 324.5* 317.9* 543.9*    ProBNP (last 3 results) No results for input(s): PROBNP in the last 8760 hours.  CBG: No results for input(s): GLUCAP in the last 168 hours.  Radiological Exams on Admission: Dg Chest 2 View  04/14/2016  CLINICAL DATA:  Acute onset of centralized generalized chest pain and shortness of breath. Initial encounter. EXAM: CHEST  2 VIEW COMPARISON:  Chest radiograph performed 12/13/2015, and CT of the chest performed 12/24/2015 FINDINGS: The lungs are well-aerated. Mild vascular congestion is  noted. Mild bibasilar opacities may reflect atelectasis or mild pneumonia. There is no evidence of pleural effusion or pneumothorax. The heart is mildly enlarged. No acute osseous abnormalities are seen. IMPRESSION: Mild vascular congestion and mild cardiomegaly noted. Mild bibasilar opacities may reflect atelectasis or mild pneumonia. Electronically Signed   By: Garald Balding M.D.   On: 04/14/2016 02:55    Assessment/Plan Present on Admission:    CHF exacerbation ([place]) - with SOB, chest pressure (normal trop) - given lasix in ER - cont home meds, add IV lasix scheduled - trend troponin - last TTE Feb 2017, normal EF, grade 1 diastolic dysfunction, PA pressure over 52mHg - consider cardiology consult if not improving, repeat echo not ordered   Hypertension   Seiz[place] disorder [place]   Hypokalemia - poss due to diarrhea from new pHTN med, replete orally, follow daily   GERD (gastroesophageal reflux disease) - PO PPI   Hypothyroidism   DVT prophylaxis: Lovenox   Code Status: FULL   Family Communication: D/w son and brother at bedside this AM   Disposition Plan: Home in 1-2 days.   Consults called: None   Admission status: Obs   Time spent: 54 minutes  Kasi Lasky MMarry GuanMD [facility] Pager [phone] If 7PM-7AM, please contact night-coverage [email or web address] Password [facility] 04/14/2016, 5:24 AM

## 2016-04-14 NOTE — ED Notes (Signed)
Patient transported to X-ray 

## 2016-04-14 NOTE — ED Provider Notes (Signed)
CSN: [id number]     Arrival date & time 04/14/16  0121 History  By signing my name below, I, Arianna Nassar, attest that this documentation has been prepared under the direction and in the presence of Merryl Hacker, MD.  Electronically Signed: Julien Nordmann, ED Scribe. 04/14/2016. 1:40 AM.      Chief Complaint  Patient presents with  . Shortness of Breath  . Chest Pain      The history is provided by the patient. No language interpreter was used.   HPI Comments: Darren Mueller is a 65 y.o. male brought in by ambulance, who has a PMHx of pulmonary HTN, seizures, GERD, hypopotassemia, COPD,  presents to the Emergency Department complaining of sudden onset, gradual worsening, intermittent sharp chest pain onset about 2 hours ago. He note associated shortness of breath. Pt says that he was outside grilling around 11:30pm when he started to feel out of breath. Pt notes using an inhaler to alleviate his symptoms with no relief. He is supposed to be on oxygen at home but he notes that he is not compliant. Denies leg swelling, fever, and cough.Does report lower extremity swelling. Currently reports 2 out of 10 sharp chest pain.  Past Medical History  Diagnosis Date  . Hypertension   . Seizures ([place])   . Hx of colonic polyps   . GERD (gastroesophageal reflux disease)   . Gout   . Alcoholic cirrhosis of liver with ascites ([place]) 2014  . Unspecified hypothyroidism 01/25/2013  . Unspecified pleural effusion   . Other chronic pulmonary heart diseases   . Hypopotassemia   . Renal insufficiency 07/18/2013  . Diarrhea 09/04/2013  . Otalgia 11/28/2013  . Dermatitis 06/10/2015  . Alcohol dependence in remission ([place]) 03/22/2011    In remission   . Alcohol dependence ([place]) 03/22/2011    In remission   . Red eye 03/03/2016    right  . Sleep apnea     does not wear CPAP  . Heart murmur    Past Surgical History  Procedure Laterality Date  . Colonoscopy N/A 12/08/2013    Procedure: COLONOSCOPY;   Surgeon: Milus Banister, MD;  Location: [facility];  Service: Endoscopy;  Laterality: N/A;  . Right heart catheterization Right 06/06/2014    Procedure: RIGHT HEART CATH;  Surgeon: Jolaine Artist, MD;  Location: [facility] CATH LAB;  Service: Cardiovascular;  Laterality: Right;  . Uvulopalatopharyngoplasty  1999  . Cardiac catheterization N/A 12/21/2015    Procedure: Right Heart Cath;  Surgeon: Jolaine Artist, MD;  Location: [place] CV LAB;  Service: Cardiovascular;  Laterality: N/A;  . Esophagogastroduodenoscopy (egd) with propofol N/A 03/27/2016    Procedure: ESOPHAGOGASTRODUODENOSCOPY (EGD) WITH PROPOFOL;  Surgeon: Milus Banister, MD;  Location: [place];  Service: Endoscopy;  Laterality: N/A;   Family History  Problem Relation Age of Onset  . Heart disease Mother   . Heart attack Mother   . Hypertension Mother   . Prostate cancer Father   . Colon cancer Neg Hx    Social History  Substance Use Topics  . Smoking status: Former Smoker -- 2.00 packs/day for 5 years    Types: Cigarettes    Quit date: 09/22/1979  . Smokeless tobacco: Never Used  . Alcohol Use: 0.0 oz/week    0 Standard drinks or equivalent per week     Comment: 2 vodka tonic mon, wed, friday    Review of Systems  Constitutional: Negative for fever.  Respiratory: Positive for cough and shortness  of breath.   Cardiovascular: Positive for chest pain and leg swelling.  Gastrointestinal: Negative for nausea, vomiting and abdominal pain.  All other systems reviewed and are negative.     Allergies  Aspirin  Home Medications   Prior to Admission medications   Medication Sig Start Date End Date Taking? Authorizing Provider  albuterol (PROVENTIL HFA;VENTOLIN HFA) 108 (90 BASE) MCG/ACT inhaler Inhale 2 puffs into the lungs every 6 (six) hours as needed for wheezing or shortness of breath.   Yes Historical Provider, MD  doxepin (SINEQUAN) 10 MG capsule TAKE 1 TO 2 CAPSULES AT BEDTIME Patient taking  differently: TAKE 1 TO 2 CAPSULES AT BEDTIME as needed for sleep 01/16/16  Yes Mosie Lukes, MD  Fluticasone Propionate 0.05 % LOTN Apply daily to affected area Patient taking differently: Apply 1 application topically daily. Apply daily to affected area 05/15/15  Yes Mosie Lukes, MD  furosemide (LASIX) 40 MG tablet Take 40 mg by mouth every Monday, Wednesday, and Friday.    Yes Historical Provider, MD  levothyroxine (SYNTHROID, LEVOTHROID) 25 MCG tablet Take 1 tablet (25 mcg total) by mouth daily. 12/04/15  Yes Mosie Lukes, MD  loperamide (ANTI-DIARRHEAL) 2 MG capsule Take 4 mg by mouth daily.    Yes Historical Provider, MD  losartan (COZAAR) 50 MG tablet TAKE 1 TABLET DAILY 03/27/16  Yes Amy D Clegg, NP  Macitentan (OPSUMIT) 10 MG TABS Take 10 mg by mouth daily. 03/23/14  Yes Shaune Pascal Bensimhon, MD  mometasone-formoterol (DULERA) 100-5 MCG/ACT AERO Inhale 2 puffs into the lungs daily.    Yes Historical Provider, MD  omeprazole (PRILOSEC) 40 MG capsule Take 1 capsule (40 mg total) by mouth daily. 12/04/15  Yes Mosie Lukes, MD  potassium chloride (K-DUR) 10 MEQ tablet Take 1 tablet (10 mEq total) by mouth daily. Patient taking differently: Take 10 mEq by mouth 2 (two) times daily.  12/25/14  Yes Larey Dresser, MD  Selexipag (UPTRAVI) 1000 MCG TABS Take 1,000 mg by mouth every morning. Patient taking differently: Take 1,000 mg by mouth at bedtime.  11/13/15  Yes Shaune Pascal Bensimhon, MD  Selexipag (UPTRAVI) 800 MCG TABS Take 1 tablet (800 mcg total) by mouth every evening. Patient taking differently: Take 800 mcg by mouth daily.  11/13/15  Yes Jolaine Artist, MD  spironolactone (ALDACTONE) 50 MG tablet Take 1 tablet (50 mg total) by mouth daily. 12/04/15  Yes Mosie Lukes, MD  SYMAX-SR 0.375 MG 12 hr tablet TAKE 1 TABLET TWICE A DAY 03/02/16  Yes Mosie Lukes, MD  Tadalafil, PAH, (ADCIRCA) 20 MG TABS TAKE 2 TABLETS (40 MG TOTAL) DAILY 01/01/16  Yes Jolaine Artist, MD  verapamil (VERELAN  PM) 180 MG 24 hr capsule Take 1 capsule (180 mg total) by mouth at bedtime. Patient taking differently: Take 180 mg by mouth daily.  12/04/15  Yes Mosie Lukes, MD   Triage vitals: BP 135/86 mmHg  Pulse 89  Temp(Src) 97.7 F (36.5 C) (Oral)  Resp 20  SpO2 98% Physical Exam  Constitutional: He is oriented to person, place, and time. He appears well-developed and well-nourished. No distress.  HENT:  Head: Normocephalic and atraumatic.  Cardiovascular: Normal rate and regular rhythm.   Murmur heard. Pulmonary/Chest: Effort normal and breath sounds normal. No respiratory distress. He exhibits no tenderness.  Mild crackles in the bases  Abdominal: Soft. Bowel sounds are normal. There is no tenderness. There is no rebound.  Musculoskeletal: He exhibits edema.  1+ lower extremity edema  Neurological: He is alert and oriented to person, place, and time.  Skin: Skin is warm and dry.  Psychiatric: He has a normal mood and affect.  Nursing note and vitals reviewed.   ED Course  Procedures  DIAGNOSTIC STUDIES: Oxygen Saturation is 98% on RA, normal by my interpretation.  COORDINATION OF CARE:  1:40 AM Discussed treatment plan which includes lab work with pt at bedside and pt agreed to plan.  Labs Review Labs Reviewed  BASIC METABOLIC PANEL - Abnormal; Notable for the following:    Potassium 3.1 (*)    CO2 19 (*)    Creatinine, Ser 1.39 (*)    Calcium 8.8 (*)    GFR calc non Af Amer 51 (*)    GFR calc Af Amer 59 (*)    All other components within normal limits  CBC - Abnormal; Notable for the following:    WBC 2.6 (*)    RBC 3.60 (*)    Hemoglobin 12.3 (*)    HCT 36.9 (*)    MCV 102.5 (*)    MCH 34.2 (*)    RDW 15.6 (*)    Platelets 46 (*)    All other components within normal limits  BRAIN NATRIURETIC PEPTIDE - Abnormal; Notable for the following:    B Natriuretic Peptide 543.9 (*)    All other components within normal limits  I-STAT TROPOININ, ED    Imaging  Review Dg Chest 2 View  04/14/2016  CLINICAL DATA:  Acute onset of centralized generalized chest pain and shortness of breath. Initial encounter. EXAM: CHEST  2 VIEW COMPARISON:  Chest radiograph performed 12/13/2015, and CT of the chest performed 12/24/2015 FINDINGS: The lungs are well-aerated. Mild vascular congestion is noted. Mild bibasilar opacities may reflect atelectasis or mild pneumonia. There is no evidence of pleural effusion or pneumothorax. The heart is mildly enlarged. No acute osseous abnormalities are seen. IMPRESSION: Mild vascular congestion and mild cardiomegaly noted. Mild bibasilar opacities may reflect atelectasis or mild pneumonia. Electronically Signed   By: Garald Balding M.D.   On: 04/14/2016 02:55   I have personally reviewed and evaluated these images and lab results as part of my medical decision-making.   EKG Interpretation   Date/Time:  Monday April 14 2016 01:25:19 EDT Ventricular Rate:  90 PR Interval:  245 QRS Duration: 103 QT Interval:  395 QTC Calculation: 483 R Axis:   114 Text Interpretation:  Sinus rhythm Prolonged PR interval Probable left  atrial enlargement ST and T wave changes anteriorly similar when compared  to prior Confirmed by Christmas Faraci  MD, [place] ([id number]) on 04/14/2016 1:28:15 AM      MDM   Final diagnoses:  Other chest pain  Acute on chronic heart failure, unspecified heart failure type [facility])   Patient presents with chest pain and shortness of breath as well as dry cough. History of pulmonary hypertension, right-sided heart failure.  Does not look overtly volume overloaded but does have mild lower extremity swelling. Nontoxic on exam. Afebrile. EKG shows ST and T-wave changes anteriorly which are similar to prior. Patient was given Nitropaste with improvement of his chest pain to 0 out of 10. He is allergic to aspirin. BMP is notable for potassium of 3.1. Creatinine at baseline. BNP is 543. Chest x-ray shows vascular congestion and  cardiomegaly as well as mild bibasilar opacities. Clinical picture most suspicious for mild acute heart failure.  Patient was given 40 mg of Lasix for diuresis. Given chest pain  in the setting of an acute CHF exacerbation, with benefit for serial enzymes and further diuresis.    I personally performed the services described in this documentation, which was scribed in my presence. The recorded information has been reviewed and is accurate.   Merryl Hacker, MD 04/14/16 [id number]

## 2016-04-14 NOTE — ED Notes (Signed)
Admitting MD at bedside.

## 2016-04-14 NOTE — Progress Notes (Signed)
[facility]                                                                              Patient Demographics  Darren Mueller, is a 65 y.o. male, DOB - 12-20-1950, JGG:[id number]  Admit date - 04/14/2016   Admitting Physician No admitting provider for patient encounter.  Outpatient Primary MD for the patient is Penni Homans, MD  Outpatient specialists:   LOS -   days    Chief Complaint  Patient presents with  . Shortness of Breath  . Chest Pain       Brief summary   Darren Mueller is a 65 y.o. male with medical history significant for pHTN, right sided heart failure who is being admitted for chest pain and acute heart failure exacerbation. He was in his usual state of health until 11:30pm  last night 04/13/16 when he had sudden onset of right sided sharp chest pain associated with SOB. Took a puff of albuterol inhaler which helped pain and SOB momentarily, but both came back after a couple minutes.  Chest x-ray showed pulmonary edema, chest pain was resolved after addition of nitro paste BNP elevated   Assessment & Plan    Principal Problem:   Chest pain, Systolic CHF, acute on chronic (HCC)lWith underlying history of pulmonary hypertension:  - Chest pain currently resolved, troponins 2 negative, the NP 543.9 - D-dimer negative - 2-D echo in February 2017 showed normal EF, grade 1 diastolic dysfunction, PA pressure over 80  - Cardiology consulted, will await further recommendations  - Continue IV Lasix, strict I's and O's and daily weights  Active Problems:  Pulmonary Hypertension in the setting of cirrhosis, OSA, COPD - On macitentan, adcirca, and selexipag per cardiology - Cardiology consulted  Hypertension - Stable on current regimen, Lasix, Aldactone   Hypokalemia - Likely due to diuresis, replace  OSA - Intolerant of CPAP, continue O2 via nasal cannula  Pancytopenia, liver cirrhosis, thrombocytopenia - Has followed hematology, Dr.  Alen Blew    GERD (gastroesophageal reflux disease) - Continue PPI    Hypothyroidism - Continue Synthroid, check TSH  History of liver cirrhosis  -Following GI, Dr. Ardis Hughs, likely due to alcohol abuse and RV failure  - Currently stable, no evidence of hepatic encephalopathy   -On Lasix and Aldactone  Code Status:Full code  DVT Prophylaxis:  SCD's Family Communication: Discussed in detail with the patient, all imaging results, lab results explained to the patient   Disposition Plan:   Time Spent in minutes   25 minutes  Procedures:  None   Consultants:   Cardiology  Antimicrobials :      Medications  Scheduled Meds: . furosemide      . furosemide  20 mg Intravenous Q12H  . hyoscyamine  0.375 mg Oral BID  . levothyroxine  25 mcg Oral QAC breakfast  . Macitentan  10 mg Oral Daily  . mometasone-formoterol  2 puff Inhalation Daily  . pantoprazole  40 mg Oral Daily  . potassium chloride SA  40 mEq Oral Daily  . Selexipag  1,000 mg Oral QHS  . Selexipag  800 mcg Oral Daily  . sodium  chloride flush  3 mL Intravenous Q12H  . spironolactone  50 mg Oral Daily  . Tadalafil (PAH)  40 mg Oral Daily   Continuous Infusions: . sodium chloride     PRN Meds:.sodium chloride, acetaminophen, albuterol, doxepin, ondansetron (ZOFRAN) IV, sodium chloride flush   Antibiotics   Anti-infectives    None        Subjective:   Darren Mueller was seen and examined today. Currently no chest pain, shortness of breath+.  Patient denies dizziness, abdominal pain, N/V/D/C, new weakness, numbess, tingling. No acute events overnight.    Objective:   Filed Vitals:   04/14/16 0800 04/14/16 0845 04/14/16 1000 04/14/16 1100  BP: 140/96 139/92 140/94 139/121  Pulse: 76 73 68 71  Temp:      TempSrc:      Resp: _0 SpO2: 99% 98% 97% 99%    Intake/Output Summary (Last 24 hours) at 04/14/16 1229 Last data filed at 04/14/16 0919  Gross per 24 hour  Intake    240 ml  Output    3350 ml  Net  -3110 ml     Wt Readings from Last 3 Encounters:  03/11/16 92.443 kg (203 lb 12.8 oz)  03/03/16 92.25 kg (203 lb 6 oz)  01/30/16 94.348 kg (208 lb)     Exam  General: Alert and oriented x 3, NAD  HEENT:    Neck: Supple  Cardiovascular: S1 S2 auscultated, no rubs, murmurs or gallops. Regular rate and rhythm.  Respiratory: Minimal bibasilar crackles   Gastrointestinal: Soft, nontender, nondistended, + bowel sounds  Ext: no cyanosis clubbing, 1+ edema  Neuro: AAOx3, Cr N's II- XII. Strength 5/5 upper and lower extremities bilaterally  Skin: No rashes  Psych: Normal affect and demeanor, alert and oriented x3    Data Reviewed:  I have personally reviewed following labs and imaging studies  Micro Results No results found for this or any previous visit (from the past 240 hour(s)).  Radiology Reports Dg Chest 2 View  04/14/2016  CLINICAL DATA:  Acute onset of centralized generalized chest pain and shortness of breath. Initial encounter. EXAM: CHEST  2 VIEW COMPARISON:  Chest radiograph performed 12/13/2015, and CT of the chest performed 12/24/2015 FINDINGS: The lungs are well-aerated. Mild vascular congestion is noted. Mild bibasilar opacities may reflect atelectasis or mild pneumonia. There is no evidence of pleural effusion or pneumothorax. The heart is mildly enlarged. No acute osseous abnormalities are seen. IMPRESSION: Mild vascular congestion and mild cardiomegaly noted. Mild bibasilar opacities may reflect atelectasis or mild pneumonia. Electronically Signed   By: Garald Balding M.D.   On: 04/14/2016 02:55    Lab Data:  CBC:  Recent Labs Lab 04/14/16 0149  WBC 2.6*  HGB 12.3*  HCT 36.9*  MCV 102.5*  PLT 46*   Basic Metabolic Panel:  Recent Labs Lab 04/14/16 0149  NA 136  K 3.1*  CL 108  CO2 19*  GLUCOSE 87  BUN 12  CREATININE 1.39*  CALCIUM 8.8*   GFR: CrCl cannot be calculated (Unknown ideal weight.). Liver Function Tests: No  results for input(s): AST, ALT, ALKPHOS, BILITOT, PROT, ALBUMIN in the last 168 hours. No results for input(s): LIPASE, AMYLASE in the last 168 hours. No results for input(s): AMMONIA in the last 168 hours. Coagulation Profile: No results for input(s): INR, PROTIME in the last 168 hours. Cardiac Enzymes:  Recent Labs Lab 04/14/16 0724  TROPONINI <0.03   BNP (last 3 results) No results for input(s): PROBNP  in the last 8760 hours. HbA1C: No results for input(s): HGBA1C in the last 72 hours. CBG: No results for input(s): GLUCAP in the last 168 hours. Lipid Profile: No results for input(s): CHOL, HDL, LDLCALC, TRIG, CHOLHDL, LDLDIRECT in the last 72 hours. Thyroid Function Tests: No results for input(s): TSH, T4TOTAL, FREET4, T3FREE, THYROIDAB in the last 72 hours. Anemia Panel: No results for input(s): VITAMINB12, FOLATE, FERRITIN, TIBC, IRON, RETICCTPCT in the last 72 hours. Urine analysis:    Component Value Date/Time   COLORURINE YELLOW 10/18/2013 1247   APPEARANCEUR CLEAR 10/18/2013 1247   LABSPEC 1.017 10/18/2013 1247   PHURINE 5.0 10/18/2013 1247   GLUCOSEU NEGATIVE 10/18/2013 1247   HGBUR NEGATIVE 10/18/2013 1247   BILIRUBINUR NEGATIVE 10/18/2013 1247   KETONESUR NEGATIVE 10/18/2013 1247   PROTEINUR NEGATIVE 10/18/2013 1247   UROBILINOGEN 0.2 10/18/2013 1247   NITRITE NEGATIVE 10/18/2013 1247   LEUKOCYTESUR NEGATIVE 10/18/2013 [place] M.D. [facility] 04/14/2016, 12:29 PM  Pager: [phone] Between 7am to 7pm - call Pager - [phone]  After 7pm go to [email or web address] - password [email or web address]  Call night coverage person covering after 7pm

## 2016-04-14 NOTE — Consult Note (Signed)
       Reason for Consult: Acute SOB with PAH   Referring Physician: Dr. Rai   PCP:  BLYTH, STACEY, MD  Primary Cardiologist:Dr. Dyasia Firestine   Darren Mueller is an 65 y.o. male.    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  He underwent RHC 2/2017with pulmonary pressures higher since previous but cardiac improved. However PVR was still very high and RAP 11.  No LHC.  Continues with selexipag at 800 qam/ 1000 qpm. Unable to tolerate any further uptitration with HAs and worsening flushing. Remains on Macitentan and Adcirca.  Last seen in HF clinic 01/2016.  Has 2 vodka and tonics per day.  Today  At 0128 presented with sudden onset SOB and chest pain while grilling, inhaler helped some. He was hypertensive at 180/110.  He had Rt upper chest pain sharp without radiation.  No nausea or diaphoresis.  He does have diarrhea that is ongoing from his Selexapeg per pt.  He used the inhaler again and SOB improved.  He was given NTG here and his chest pain resolved.  Though he has minimal now.  No increase of wt or lower ext edema.   K+ low at 3.1, Cr 1.39, BNP elevated at 543.  Troponin neg 0.02 and <0.03.  WBC 2.6, H/H 12.3/36.9 Plts 46 D-Dimer 0.27   CXR with mild vascular congestion.  Mild cardiomegaly.  EKG SR and similar to 12/2015 EKG with deep T wave inversions in V1-4.   Has rec'd 20 mg IV lasix, but continues to have some mild SOB, though he feels if active it would be worse.  He is negative 2,250.     PAH meds- unable to titrate further 1) Macitnentan 10 2) Adcirca 10 3) Selexapeg  Studies:  ECHO 12/14 EF 55-60% Peak PA pressure 35. Severe RV dysfunction  ECHO 7/15 EF 60% RV moderately to severely dilated. Moderate HK RVSP 57mm HG  ECHO 6/16 EF 60% RV moderately to severely dilated. Severe HK RVSP ~65 mm HG. D-shaped  septum Echo 2/17 LVEF 60-65% RV massively dilated. Flat septum. Severe HK. Moderate TR RVSP ~65. IVC small. No effusion   RHC 2/17 RA = 11 RV = 80/16/19 PA = 83/61 (71) PCW = 9 Fick cardiac output/index = 5.6/2.6 PVR = 10.3 WU Ao sat = 94% PA sat = 65%, 68% High SVC sat = 65%  PFTs 1/17 FEV1 2.05 L (61%) FVC 2.48L (56%) DLCO 44%  EGD 03/27/16 small < 5mm esophageal varices no changes, stable.  Past Medical History  Diagnosis Date  . Hypertension   . Seizures (HCC)   . Hx of colonic polyps   . GERD (gastroesophageal reflux disease)   . Gout   . Alcoholic cirrhosis of liver with ascites (HCC) 2014  . Unspecified hypothyroidism 01/25/2013  . Unspecified pleural effusion   . Other chronic pulmonary heart diseases   . Hypopotassemia   . Renal insufficiency 07/18/2013  . Diarrhea 09/04/2013  . Otalgia 11/28/2013  . Dermatitis 06/10/2015  . Alcohol dependence in remission (HCC) 03/22/2011    In remission   . Alcohol dependence (HCC) 03/22/2011    In remission   . Red eye 03/03/2016    right  . Sleep apnea     does not wear CPAP  . Heart murmur     Past Surgical   History  Procedure Laterality Date  . Colonoscopy N/A 12/08/2013    Procedure: COLONOSCOPY;  Surgeon: Milus Banister, MD;  Location: [facility] ENDOSCOPY;  Service: Endoscopy;  Laterality: N/A;  . Right heart catheterization Right 06/06/2014    Procedure: RIGHT HEART CATH;  Surgeon: Jolaine Artist, MD;  Location: [facility] CATH LAB;  Service: Cardiovascular;  Laterality: Right;  . Uvulopalatopharyngoplasty  1999  . Cardiac catheterization N/A 12/21/2015    Procedure: Right Heart Cath;  Surgeon: Jolaine Artist, MD;  Location: [place] CV LAB;  Service: Cardiovascular;  Laterality: N/A;  . Esophagogastroduodenoscopy (egd) with propofol N/A 03/27/2016    Procedure: ESOPHAGOGASTRODUODENOSCOPY (EGD) WITH PROPOFOL;  Surgeon: Milus Banister, MD;  Location: [facility];  Service: Endoscopy;  Laterality: N/A;    Family History   Problem Relation Age of Onset  . Heart disease Mother   . Heart attack Mother   . Hypertension Mother   . Prostate cancer Father   . Colon cancer Neg Hx    Social History:  reports that he quit smoking about 36 years ago. His smoking use included Cigarettes. He has a 10 pack-year smoking history. He has never used smokeless tobacco. He reports that he drinks alcohol. He reports that he does not use illicit drugs.  Allergies:  Allergies  Allergen Reactions  . Aspirin Other (See Comments)    hypertension    OUTPATIENT MEDICATIONS: No current facility-administered medications on file prior to encounter.   Current Outpatient Prescriptions on File Prior to Encounter  Medication Sig Dispense Refill  . albuterol (PROVENTIL HFA;VENTOLIN HFA) 108 (90 BASE) MCG/ACT inhaler Inhale 2 puffs into the lungs every 6 (six) hours as needed for wheezing or shortness of breath.    . doxepin (SINEQUAN) 10 MG capsule TAKE 1 TO 2 CAPSULES AT BEDTIME (Patient taking differently: TAKE 1 TO 2 CAPSULES AT BEDTIME as needed for sleep) 180 capsule 0  . Fluticasone Propionate 0.05 % LOTN Apply daily to affected area (Patient taking differently: Apply 1 application topically daily. Apply daily to affected area) 3 Bottle 3  . furosemide (LASIX) 40 MG tablet Take 40 mg by mouth every Monday, Wednesday, and Friday.     . levothyroxine (SYNTHROID, LEVOTHROID) 25 MCG tablet Take 1 tablet (25 mcg total) by mouth daily. 90 tablet 1  . loperamide (ANTI-DIARRHEAL) 2 MG capsule Take 4 mg by mouth daily.     Marland Kitchen losartan (COZAAR) 50 MG tablet TAKE 1 TABLET DAILY 90 tablet 2  . Macitentan (OPSUMIT) 10 MG TABS Take 10 mg by mouth daily. 90 tablet 3  . mometasone-formoterol (DULERA) 100-5 MCG/ACT AERO Inhale 2 puffs into the lungs daily.     Marland Kitchen omeprazole (PRILOSEC) 40 MG capsule Take 1 capsule (40 mg total) by mouth daily. 90 capsule 2  . potassium chloride (K-DUR) 10 MEQ tablet Take 1 tablet (10 mEq total) by mouth daily.  (Patient taking differently: Take 10 mEq by mouth 2 (two) times daily. ) 360 tablet 3  . Selexipag (UPTRAVI) 1000 MCG TABS Take 1,000 mg by mouth every morning. (Patient taking differently: Take 1,000 mg by mouth at bedtime. ) 90 tablet 2  . Selexipag (UPTRAVI) 800 MCG TABS Take 1 tablet (800 mcg total) by mouth every evening. (Patient taking differently: Take 800 mcg by mouth daily. ) 90 tablet 2  . spironolactone (ALDACTONE) 50 MG tablet Take 1 tablet (50 mg total) by mouth daily. 90 tablet 1  . SYMAX-SR 0.375 MG 12 hr tablet TAKE 1 TABLET TWICE  A DAY 180 tablet 1  . Tadalafil, PAH, (ADCIRCA) 20 MG TABS TAKE 2 TABLETS (40 MG TOTAL) DAILY 180 tablet 2  . verapamil (VERELAN PM) 180 MG 24 hr capsule Take 1 capsule (180 mg total) by mouth at bedtime. (Patient taking differently: Take 180 mg by mouth daily. ) 90 capsule 1     Results for orders placed or performed during the hospital encounter of 04/14/16 (from the past 48 hour(s))  Basic metabolic panel     Status: Abnormal   Collection Time: 04/14/16  1:49 AM  Result Value Ref Range   Sodium 136 135 - 145 mmol/L   Potassium 3.1 (L) 3.5 - 5.1 mmol/L   Chloride 108 101 - 111 mmol/L   CO2 19 (L) 22 - 32 mmol/L   Glucose, Bld 87 65 - 99 mg/dL   BUN 12 6 - 20 mg/dL   Creatinine, Ser 1.39 (H) 0.61 - 1.24 mg/dL   Calcium 8.8 (L) 8.9 - 10.3 mg/dL   GFR calc non Af Amer 51 (L) >60 mL/min   GFR calc Af Amer 59 (L) >60 mL/min    Comment: (NOTE) The eGFR has been calculated using the CKD EPI equation. This calculation has not been validated in all clinical situations. eGFR's persistently <60 mL/min signify possible Chronic Kidney Disease.    Anion gap 9 5 - 15  CBC     Status: Abnormal   Collection Time: 04/14/16  1:49 AM  Result Value Ref Range   WBC 2.6 (L) 4.0 - 10.5 K/uL   RBC 3.60 (L) 4.22 - 5.81 MIL/uL   Hemoglobin 12.3 (L) 13.0 - 17.0 g/dL   HCT 36.9 (L) 39.0 - 52.0 %   MCV 102.5 (H) 78.0 - 100.0 fL   MCH 34.2 (H) 26.0 - 34.0 pg    MCHC 33.3 30.0 - 36.0 g/dL   RDW 15.6 (H) 11.5 - 15.5 %   Platelets 46 (L) 150 - 400 K/uL    Comment: SPECIMEN CHECKED FOR CLOTS PLATELET COUNT CONFIRMED BY SMEAR   Brain natriuretic peptide     Status: Abnormal   Collection Time: 04/14/16  1:49 AM  Result Value Ref Range   B Natriuretic Peptide 543.9 (H) 0.0 - 100.0 pg/mL  I-stat troponin, ED     Status: None   Collection Time: 04/14/16  1:53 AM  Result Value Ref Range   Troponin i, poc 0.02 0.00 - 0.08 ng/mL   Comment 3            Comment: Due to the release kinetics of cTnI, a negative result within the first hours of the onset of symptoms does not rule out myocardial infarction with certainty. If myocardial infarction is still suspected, repeat the test at appropriate intervals.   Troponin I     Status: None   Collection Time: 04/14/16  7:24 AM  Result Value Ref Range   Troponin I <0.03 <0.031 ng/mL    Comment:        NO INDICATION OF MYOCARDIAL INJURY.   D-dimer, quantitative (not at [facility])     Status: None   Collection Time: 04/14/16  7:59 AM  Result Value Ref Range   D-Dimer, Quant 0.27 0.00 - 0.50 ug/mL-FEU    Comment: (NOTE) At the manufacturer cut-off of 0.50 ug/mL FEU, this assay has been documented to exclude PE with a sensitivity and negative predictive value of 97 to 99%.  At this time, this assay has not been approved by the FDA to exclude   DVT/VTE. Results should be correlated with clinical presentation.    Dg Chest 2 View  04/14/2016  CLINICAL DATA:  Acute onset of centralized generalized chest pain and shortness of breath. Initial encounter. EXAM: CHEST  2 VIEW COMPARISON:  Chest radiograph performed 12/13/2015, and CT of the chest performed 12/24/2015 FINDINGS: The lungs are well-aerated. Mild vascular congestion is noted. Mild bibasilar opacities may reflect atelectasis or mild pneumonia. There is no evidence of pleural effusion or pneumothorax. The heart is mildly enlarged. No acute osseous abnormalities  are seen. IMPRESSION: Mild vascular congestion and mild cardiomegaly noted. Mild bibasilar opacities may reflect atelectasis or mild pneumonia. Electronically Signed   By: Jeffery  Chang M.D.   On: 04/14/2016 02:55    ROS: General:no colds or fevers, no weight increases mild loss Skin:no rashes or ulcers HEENT:no blurred vision, no congestion CV:see HPI PUL:see HPI GI:+ diarrhea no constipation or melena, no indigestion GU:no hematuria, no dysuria MS:no joint pain, no claudication Neuro:no syncope, no lightheadedness, no seizures Endo:no diabetes, no thyroid disease + sleep apnea unable to tolerate CPAP--     Blood pressure 139/121, pulse 71, temperature 97.7 F (36.5 C), temperature source Oral, resp. rate 17, SpO2 99 %.  Wt Readings from Last 3 Encounters:  03/11/16 203 lb 12.8 oz (92.443 kg)  03/03/16 203 lb 6 oz (92.25 kg)  01/30/16 208 lb (94.348 kg)    PE:General:Pleasant affect, NAD Skin:Warm and dry, brisk capillary refill HEENT:normocephalic, sclera clear, mucus membranes moist Neck:supple, + JVD, no bruits  Heart:S1S2 RRR with soft systolic murmur, gallup, rub or click Lungs: with rales bases, no  rhonchi, or wheezes Abd:soft, non tender, + BS, do not palpate liver spleen or masses Ext:+ lower ext edema, mostly non pitting.  2+ pedal pulses, 2+ radial pulses Neuro:alert and oriented X 3, MAE, follows commands, + facial symmetry   Assessment/Plan Principal Problem:   Systolic CHF, acute on chronic (HCC) Active Problems:   Hypertension   Seizure disorder (HCC)   Hypokalemia   GERD (gastroesophageal reflux disease)   Secondary pulmonary hypertension (HCC)   Hypothyroidism   CHF exacerbation (HCC)  1. Chest pain with acute SOB, relief with NTG, was hypertensive as well.   Troponin neg.  EKG without acute changes.   - repeat  Troponin has been ordered. -he has not had a Lt heart cath but with plts at 46 would be high risk.     2.  Acute Rt HF, with hx normal EF  60-65% with severe PAH-  -is diuresing from 20 mg IV lasix. Now neg 2250  May need more diuresing.   3. Severe PAH on triple therapy, may need IV medication- Dr. Petrea Fredenburg to see.   4. Hypokalemia 3.1 on admit, may have been from diarrhea?  Though he states this is chronic problem.   5. Pancytopenia with plts at 46 and WBC at 2.6.  Laura Ingold  Nurse Practitioner Certified [facility] Pager [phone] or after 5pm or weekends call [phone] 04/14/2016, 2:54 PM   Patient seen and examined with Laura Ingold, NP-C. We discussed all aspects of the encounter. I agree with the assessment and plan as stated above.   He is markedly volume overloaded and suspect CP and dyspnea related to HF. However he is also at risk for CAD. He has had multiple right heart caths but has not had coronary angio. He would be at high risk for coronary intervention with low platelets. Will continue diuresis and assess symptoms response. He   also has severe PAH despite triple therapy and may require IV prostanoids at some point. If remains symptomatic in am may need to consider R/L cath. We will follow closely. Would not use IV heparin unless troponins are positive.   Anvay Tennis,MD 6:41 PM

## 2016-04-14 NOTE — ED Notes (Signed)
Pharmacy contacted about sending pt. Daily meds.

## 2016-04-15 ENCOUNTER — Observation Stay (HOSPITAL_BASED_OUTPATIENT_CLINIC_OR_DEPARTMENT_OTHER): Payer: Medicare Other

## 2016-04-15 ENCOUNTER — Other Ambulatory Visit: Payer: Self-pay | Admitting: Family Medicine

## 2016-04-15 ENCOUNTER — Encounter (HOSPITAL_COMMUNITY)
Admission: EM | Disposition: A | Discharge status: Home / Self Care | Payer: Self-pay | Source: Home / Self Care | Attending: Emergency Medicine

## 2016-04-15 DIAGNOSIS — I5023 Acute on chronic systolic (congestive) heart failure: Secondary | ICD-10-CM | POA: Diagnosis not present

## 2016-04-15 DIAGNOSIS — I509 Heart failure, unspecified: Secondary | ICD-10-CM | POA: Diagnosis not present

## 2016-04-15 DIAGNOSIS — I272 Other secondary pulmonary hypertension: Secondary | ICD-10-CM | POA: Diagnosis not present

## 2016-04-15 DIAGNOSIS — G40909 Epilepsy, unspecified, not intractable, without status epilepticus: Secondary | ICD-10-CM

## 2016-04-15 DIAGNOSIS — I110 Hypertensive heart disease with heart failure: Secondary | ICD-10-CM | POA: Diagnosis not present

## 2016-04-15 DIAGNOSIS — I2721 Secondary pulmonary arterial hypertension: Secondary | ICD-10-CM | POA: Insufficient documentation

## 2016-04-15 DIAGNOSIS — K219 Gastro-esophageal reflux disease without esophagitis: Secondary | ICD-10-CM | POA: Diagnosis not present

## 2016-04-15 DIAGNOSIS — R072 Precordial pain: Secondary | ICD-10-CM | POA: Insufficient documentation

## 2016-04-15 DIAGNOSIS — I10 Essential (primary) hypertension: Secondary | ICD-10-CM | POA: Diagnosis not present

## 2016-04-15 HISTORY — PX: CARDIAC CATHETERIZATION: SHX172

## 2016-04-15 LAB — ECHOCARDIOGRAM COMPLETE
AVLVOTPG: 4 mmHg
AVPHT: 594 ms
CHL CUP DOP CALC LVOT VTI: 18.6 cm
CHL CUP MV DEC (S): 296
CHL CUP TV REG PEAK VELOCITY: 315 cm/s
E decel time: 296 msec
FS: 27 % — AB (ref 28–44)
HEIGHTINCHES: 72 in
IV/PV OW: 1.64
LADIAMINDEX: 1.61 cm/m2
LASIZE: 34 mm
LAVOLA4C: 62.6 mL
LEFT ATRIUM END SYS DIAM: 34 mm
LV PW d: 11 mm — AB (ref 0.6–1.1)
LV TDI E'MEDIAL: 3.05
LV e' LATERAL: 6.85 cm/s
LVOT SV: 58 mL
LVOT area: 3.14 cm2
LVOT diameter: 20 mm
LVOT peak vel: 95.3 cm/s
MVPKEVEL: 0.6 m/s
TAPSE: 17.8 mm
TDI e' lateral: 6.85
TRMAXVEL: 315 cm/s
WEIGHTICAEL: 3070.4 [oz_av]

## 2016-04-15 LAB — POCT I-STAT 3, VENOUS BLOOD GAS (G3P V)
ACID-BASE DEFICIT: 3 mmol/L — AB (ref 0.0–2.0)
Acid-base deficit: 6 mmol/L — ABNORMAL HIGH (ref 0.0–2.0)
BICARBONATE: 19.1 meq/L — AB (ref 20.0–24.0)
BICARBONATE: 21.4 meq/L (ref 20.0–24.0)
O2 SAT: 58 %
O2 SAT: 61 %
PCO2 VEN: 34.8 mmHg — AB (ref 45.0–50.0)
PCO2 VEN: 35.8 mmHg — AB (ref 45.0–50.0)
PO2 VEN: 31 mmHg (ref 31.0–45.0)
PO2 VEN: 32 mmHg (ref 31.0–45.0)
TCO2: 20 mmol/L (ref 0–100)
TCO2: 23 mmol/L (ref 0–100)
pH, Ven: 7.348 — ABNORMAL HIGH (ref 7.250–7.300)
pH, Ven: 7.386 — ABNORMAL HIGH (ref 7.250–7.300)

## 2016-04-15 LAB — CBC WITH DIFFERENTIAL/PLATELET
BASOS PCT: 0 %
Basophils Absolute: 0 10*3/uL (ref 0.0–0.1)
EOS ABS: 0 10*3/uL (ref 0.0–0.7)
EOS PCT: 0 %
HCT: 39.2 % (ref 39.0–52.0)
Hemoglobin: 12.7 g/dL — ABNORMAL LOW (ref 13.0–17.0)
Lymphocytes Relative: 35 %
Lymphs Abs: 1 10*3/uL (ref 0.7–4.0)
MCH: 34.1 pg — ABNORMAL HIGH (ref 26.0–34.0)
MCHC: 32.4 g/dL (ref 30.0–36.0)
MCV: 105.4 fL — ABNORMAL HIGH (ref 78.0–100.0)
Monocytes Absolute: 0.2 10*3/uL (ref 0.1–1.0)
Monocytes Relative: 8 %
Neutro Abs: 1.6 10*3/uL — ABNORMAL LOW (ref 1.7–7.7)
Neutrophils Relative %: 57 %
PLATELETS: 44 10*3/uL — AB (ref 150–400)
RBC: 3.72 MIL/uL — AB (ref 4.22–5.81)
RDW: 16 % — ABNORMAL HIGH (ref 11.5–15.5)
WBC: 2.8 10*3/uL — AB (ref 4.0–10.5)

## 2016-04-15 LAB — BASIC METABOLIC PANEL
Anion gap: 9 (ref 5–15)
BUN: 21 mg/dL — ABNORMAL HIGH (ref 6–20)
CO2: 22 mmol/L (ref 22–32)
Calcium: 9.1 mg/dL (ref 8.9–10.3)
Chloride: 107 mmol/L (ref 101–111)
Creatinine, Ser: 1.62 mg/dL — ABNORMAL HIGH (ref 0.61–1.24)
GFR calc Af Amer: 49 mL/min — ABNORMAL LOW (ref >60)
GFR calc non Af Amer: 43 mL/min — ABNORMAL LOW (ref >60)
Glucose, Bld: 104 mg/dL — ABNORMAL HIGH (ref 65–99)
Potassium: 3.2 mmol/L — ABNORMAL LOW (ref 3.5–5.1)
Sodium: 138 mmol/L (ref 135–145)

## 2016-04-15 LAB — POCT ACTIVATED CLOTTING TIME: ACTIVATED CLOTTING TIME: 169 s

## 2016-04-15 LAB — POCT I-STAT 3, ART BLOOD GAS (G3+)
Acid-base deficit: 5 mmol/L — ABNORMAL HIGH (ref 0.0–2.0)
BICARBONATE: 18.9 meq/L — AB (ref 20.0–24.0)
O2 SAT: 95 %
PCO2 ART: 30 mmHg — AB (ref 35.0–45.0)
PH ART: 7.408 (ref 7.350–7.450)
PO2 ART: 75 mmHg — AB (ref 80.0–100.0)
TCO2: 20 mmol/L (ref 0–100)

## 2016-04-15 LAB — PROTIME-INR
INR: 1.35 (ref 0.00–1.49)
Prothrombin Time: 16.8 seconds — ABNORMAL HIGH (ref 11.6–15.2)

## 2016-04-15 SURGERY — RIGHT/LEFT HEART CATH AND CORONARY ANGIOGRAPHY
Anesthesia: LOCAL

## 2016-04-15 MED ORDER — NITROGLYCERIN 1 MG/10 ML FOR IR/CATH LAB
INTRA_ARTERIAL | Status: AC
  Filled 2016-04-15: qty 10

## 2016-04-15 MED ORDER — FENTANYL CITRATE (PF) 100 MCG/2ML IJ SOLN
INTRAMUSCULAR | Status: AC
  Filled 2016-04-15: qty 2

## 2016-04-15 MED ORDER — ACETAMINOPHEN 325 MG PO TABS: 650.0000 mg | ORAL_TABLET | ORAL | Status: DC | PRN

## 2016-04-15 MED ORDER — HEPARIN (PORCINE) IN NACL 2-0.9 UNIT/ML-% IJ SOLN
INTRAMUSCULAR | Status: DC | PRN
  Administered 2016-04-15 (×2): 8 mL via INTRA_ARTERIAL

## 2016-04-15 MED ORDER — VERAPAMIL HCL 2.5 MG/ML IV SOLN
INTRAVENOUS | Status: AC
  Filled 2016-04-15: qty 2

## 2016-04-15 MED ORDER — MIDAZOLAM HCL 2 MG/2ML IJ SOLN
INTRAMUSCULAR | Status: AC
  Filled 2016-04-15: qty 2

## 2016-04-15 MED ORDER — HEPARIN SODIUM (PORCINE) 1000 UNIT/ML IJ SOLN
INTRAMUSCULAR | Status: AC
  Filled 2016-04-15: qty 1

## 2016-04-15 MED ORDER — SODIUM CHLORIDE 0.9 % IV SOLN
250.0000 mL | INTRAVENOUS | Status: DC | PRN
  Administered 2016-04-15: 250 mL via INTRAVENOUS

## 2016-04-15 MED ORDER — POTASSIUM CHLORIDE CRYS ER 20 MEQ PO TBCR
40.0000 meq | EXTENDED_RELEASE_TABLET | Freq: Once | ORAL | Status: AC
  Administered 2016-04-16: 40 meq via ORAL
  Filled 2016-04-15: qty 2

## 2016-04-15 MED ORDER — NITROGLYCERIN 1 MG/10 ML FOR IR/CATH LAB
INTRA_ARTERIAL | Status: DC | PRN
  Administered 2016-04-15: 200 ug via INTRA_ARTERIAL

## 2016-04-15 MED ORDER — ONDANSETRON HCL 4 MG/2ML IJ SOLN: 4.0000 mg | Freq: Four times a day (QID) | INTRAMUSCULAR | Status: DC | PRN

## 2016-04-15 MED ORDER — SELEXIPAG 1000 MCG PO TABS
1000.0000 ug | ORAL_TABLET | Freq: Every morning | ORAL | Status: DC
  Administered 2016-04-16: 1000 ug via ORAL
  Filled 2016-04-15: qty 1

## 2016-04-15 MED ORDER — SODIUM CHLORIDE 0.9 % IV SOLN: 250.0000 mL | INTRAVENOUS | Status: DC | PRN

## 2016-04-15 MED ORDER — SODIUM CHLORIDE 0.9% FLUSH
3.0000 mL | Freq: Two times a day (BID) | INTRAVENOUS | Status: DC
  Administered 2016-04-16 (×2): 3 mL via INTRAVENOUS

## 2016-04-15 MED ORDER — HEPARIN (PORCINE) IN NACL 2-0.9 UNIT/ML-% IJ SOLN
INTRAMUSCULAR | Status: DC | PRN
  Administered 2016-04-15: 1000 mL

## 2016-04-15 MED ORDER — LIDOCAINE HCL (PF) 1 % IJ SOLN
INTRAMUSCULAR | Status: DC | PRN
  Administered 2016-04-15: 15 mL
  Administered 2016-04-15: 5 mL

## 2016-04-15 MED ORDER — ASPIRIN 81 MG PO CHEW
81.0000 mg | CHEWABLE_TABLET | ORAL | Status: DC
  Filled 2016-04-15: qty 1

## 2016-04-15 MED ORDER — SODIUM CHLORIDE 0.9 % IV SOLN: INTRAVENOUS | Status: DC

## 2016-04-15 MED ORDER — SODIUM CHLORIDE 0.9% FLUSH: 3.0000 mL | Freq: Two times a day (BID) | INTRAVENOUS | Status: DC

## 2016-04-15 MED ORDER — SODIUM CHLORIDE 0.9% FLUSH: 3.0000 mL | INTRAVENOUS | Status: DC | PRN

## 2016-04-15 MED ORDER — SODIUM CHLORIDE 0.9 % IV SOLN
INTRAVENOUS | Status: DC
  Administered 2016-04-15: 21:00:00 via INTRAVENOUS

## 2016-04-15 MED ORDER — SODIUM CHLORIDE 0.9% FLUSH
3.0000 mL | INTRAVENOUS | Status: DC | PRN
Start: 2016-04-15 — End: 2016-04-16

## 2016-04-15 MED ORDER — SELEXIPAG 800 MCG PO TABS: 800.0000 ug | ORAL_TABLET | Freq: Every day | ORAL | Status: DC

## 2016-04-15 MED ORDER — MIDAZOLAM HCL 2 MG/2ML IJ SOLN
INTRAMUSCULAR | Status: DC | PRN
Start: 2016-04-15 — End: 2016-04-15
  Administered 2016-04-15 (×4): 1 mg via INTRAVENOUS

## 2016-04-15 MED ORDER — IOPAMIDOL (ISOVUE-370) INJECTION 76%
INTRAVENOUS | Status: AC
  Filled 2016-04-15: qty 100

## 2016-04-15 MED ORDER — ASPIRIN 81 MG PO CHEW: 81.0000 mg | CHEWABLE_TABLET | ORAL | Status: DC

## 2016-04-15 MED ORDER — HEPARIN SODIUM (PORCINE) 1000 UNIT/ML IJ SOLN
INTRAMUSCULAR | Status: DC | PRN
  Administered 2016-04-15: 4000 [IU] via INTRAVENOUS

## 2016-04-15 MED ORDER — FENTANYL CITRATE (PF) 100 MCG/2ML IJ SOLN
INTRAMUSCULAR | Status: DC | PRN
  Administered 2016-04-15 (×2): 25 ug via INTRAVENOUS

## 2016-04-15 MED ORDER — HEPARIN (PORCINE) IN NACL 2-0.9 UNIT/ML-% IJ SOLN
INTRAMUSCULAR | Status: AC
  Filled 2016-04-15: qty 1000

## 2016-04-15 MED ORDER — POTASSIUM CHLORIDE CRYS ER 20 MEQ PO TBCR: 40.0000 meq | EXTENDED_RELEASE_TABLET | Freq: Once | ORAL | Status: DC

## 2016-04-15 MED ORDER — POTASSIUM CHLORIDE CRYS ER 20 MEQ PO TBCR
40.0000 meq | EXTENDED_RELEASE_TABLET | Freq: Once | ORAL | Status: AC
  Administered 2016-04-15: 40 meq via ORAL

## 2016-04-15 MED ORDER — IOPAMIDOL (ISOVUE-370) INJECTION 76%
INTRAVENOUS | Status: DC | PRN
  Administered 2016-04-15: 60 mL via INTRA_ARTERIAL

## 2016-04-15 MED ORDER — LIDOCAINE HCL (PF) 1 % IJ SOLN
INTRAMUSCULAR | Status: AC
  Filled 2016-04-15: qty 30

## 2016-04-15 SURGICAL SUPPLY — 21 items
CATH BALLN WEDGE 5F 110CM (CATHETERS) ×2 IMPLANT
CATH INFINITI 4FR JL 4.0 (CATHETERS) ×2 IMPLANT
CATH INFINITI 4FR JL3.5 (CATHETERS) ×2 IMPLANT
CATH INFINITI 5 FR 3DRC (CATHETERS) ×2 IMPLANT
CATH INFINITI 5 FR AR2 MOD (CATHETERS) ×2 IMPLANT
CATH INFINITI 5 FR JL3.5 (CATHETERS) ×2 IMPLANT
CATH INFINITI 5 FR LCB (CATHETERS) ×2 IMPLANT
CATH INFINITI 5 FR RCB (CATHETERS) ×2 IMPLANT
CATH INFINITI JR4 5F (CATHETERS) ×2 IMPLANT
DEVICE RAD COMP TR BAND LRG (VASCULAR PRODUCTS) ×2 IMPLANT
GLIDESHEATH SLEND SS 6F .021 (SHEATH) ×2 IMPLANT
GUIDEWIRE .025 260CM (WIRE) ×2 IMPLANT
KIT HEART LEFT (KITS) ×2 IMPLANT
PACK CARDIAC CATHETERIZATION (CUSTOM PROCEDURE TRAY) ×2 IMPLANT
SHEATH FAST CATH BRACH 5F 5CM (SHEATH) ×2 IMPLANT
SHEATH PINNACLE 4F 10CM (SHEATH) ×2 IMPLANT
SYR MEDRAD MARK V 150ML (SYRINGE) ×2 IMPLANT
TRANSDUCER W/STOPCOCK (MISCELLANEOUS) ×2 IMPLANT
TUBING CIL FLEX 10 FLL-RA (TUBING) ×2 IMPLANT
WIRE HI TORQ VERSACORE-J 145CM (WIRE) ×2 IMPLANT
WIRE SAFE-T 1.5MM-J .035X260CM (WIRE) ×2 IMPLANT

## 2016-04-15 NOTE — Progress Notes (Signed)
  Echocardiogram 2D Echocardiogram has been performed.  Bobbye Charleston 04/15/2016, 11:52 AM

## 2016-04-15 NOTE — Progress Notes (Signed)
Per MD orders, patient having cardiac cath this evening.  Aspirin 52m ordered precath.  Patient has an intolerance to aspirin and it was clarified with AOda Kilts PA to give the patient the aspirin since it is a left heart cath and the benefits outweigh the risks.  Will continue to monitor.

## 2016-04-15 NOTE — Progress Notes (Signed)
[facility]                                                                              Patient Demographics  Darren Mueller, is a 65 y.o. male, DOB - 1950-11-14, FDV:[id number]  Admit date - 04/14/2016   Admitting Physician Mir Marry Guan, MD  Outpatient Primary MD for the patient is Penni Homans, MD  Outpatient specialists:   LOS -   days    Chief Complaint  Patient presents with  . Shortness of Breath  . Chest Pain       Brief summary   Darren Mueller is a 65 y.o. male with medical history significant for pHTN, right sided heart failure who is being admitted for chest pain and acute heart failure exacerbation. He was in his usual state of health until 11:30pm  last night 04/13/16 when he had sudden onset of right sided sharp chest pain associated with SOB. Took a puff of albuterol inhaler which helped pain and SOB momentarily, but both came back after a couple minutes.  Chest x-ray showed pulmonary edema, chest pain was resolved after addition of nitro paste BNP elevated   Assessment & Plan    Principal Problem:   Chest pain, Systolic CHF, acute on chronic (HCC) With underlying history of pulmonary hypertension:  - Chest pain currently resolved, troponins 3 negative, BNP 543.9 - D-dimer negative - 2-D echo in February 2017 showed normal EF, grade 1 diastolic dysfunction, PA pressure over 80  - Appreciate CHF team following, planning right and left cardiac cath, PLT 46k  Active Problems:  Pulmonary Hypertension in the setting of cirrhosis, OSA, COPD - On macitentan, adcirca, and selexipag per cardiology - Cardiology following   Hypertension - Stable on current regimen, Lasix, Aldactone   Hypokalemia - Likely due to diuresis, replaced  OSA - Intolerant of CPAP, continue O2 via nasal cannula  Pancytopenia, liver cirrhosis, thrombocytopenia - Has followed hematology, Dr. Alen Blew - CBC pending this morning    GERD (gastroesophageal  reflux disease) - Continue PPI    Hypothyroidism - Continue Synthroid, check TSH  History of liver cirrhosis  -Following GI, Dr. Ardis Hughs, likely due to alcohol abuse and RV failure  - Currently stable, no evidence of hepatic encephalopathy   -On Lasix and Aldactone  Code Status:Full code  DVT Prophylaxis:  SCD's  Family Communication: Discussed in detail with the patient, all imaging results, lab results explained to the patient   Disposition Plan:   Time Spent in minutes   25 minutes  Procedures:  None   Consultants:   Cardiology  Antimicrobials :      Medications  Scheduled Meds: . furosemide  20 mg Intravenous Q12H  . hyoscyamine  0.375 mg Oral BID  . levothyroxine  25 mcg Oral QAC breakfast  . Macitentan  10 mg Oral Daily  . mometasone-formoterol  2 puff Inhalation Daily  . pantoprazole  40 mg Oral Daily  . potassium chloride SA  40 mEq Oral Daily  . potassium chloride  40 mEq Oral Once  . [START ON 04/16/2016] Selexipag  1,000 mcg Oral q morning - 10a  . [START ON  04/16/2016] Selexipag  800 mcg Oral QHS  . sodium chloride flush  3 mL Intravenous Q12H  . spironolactone  50 mg Oral Daily  . Tadalafil (PAH)  40 mg Oral Daily   Continuous Infusions:   PRN Meds:.sodium chloride, acetaminophen, albuterol, doxepin, ondansetron (ZOFRAN) IV, sodium chloride flush   Antibiotics   Anti-infectives    None        Subjective:   Darren Mueller was seen and examined today. Chest feels tight with shortness of breath, coughing no fevers or chills. No productive phlegm.  Patient denies dizziness, abdominal pain, N/V/D/C, new weakness, numbess, tingling. No acute events overnight.    Objective:   Filed Vitals:   04/14/16 2359 04/15/16 0504 04/15/16 0808 04/15/16 0825  BP: 124/84 131/75  138/86  Pulse: 86 72  72  Temp: 98.7 F (37.1 C) 98.1 F (36.7 C)  97.8 F (36.6 C)  TempSrc: Oral Oral  Oral  Resp: _0 Height:      Weight:  87.045 kg (191 lb  14.4 oz)    SpO2: 100% 100% 98% 100%    Intake/Output Summary (Last 24 hours) at 04/15/16 1131 Last data filed at 04/15/16 1011  Gross per 24 hour  Intake    603 ml  Output   1100 ml  Net   -497 ml     Wt Readings from Last 3 Encounters:  04/15/16 87.045 kg (191 lb 14.4 oz)  03/11/16 92.443 kg (203 lb 12.8 oz)  03/03/16 92.25 kg (203 lb 6 oz)     Exam  General: Alert and oriented x 3, NAD  HEENT:    Neck: Supple, +jvp  Cardiovascular: S1 S2 auscultated, Regular rate and rhythm.  Respiratory: Minimal bibasilar crackles   Gastrointestinal: Soft, nontender, nondistended, + bowel sounds  Ext: no cyanosis clubbing, 1+ edema  Neuro: No new deficits  Skin:   Psych: Normal affect and demeanor, alert and oriented x3    Data Reviewed:  I have personally reviewed following labs and imaging studies  Micro Results No results found for this or any previous visit (from the past 240 hour(s)).  Radiology Reports Dg Chest 2 View  04/14/2016  CLINICAL DATA:  Acute onset of centralized generalized chest pain and shortness of breath. Initial encounter. EXAM: CHEST  2 VIEW COMPARISON:  Chest radiograph performed 12/13/2015, and CT of the chest performed 12/24/2015 FINDINGS: The lungs are well-aerated. Mild vascular congestion is noted. Mild bibasilar opacities may reflect atelectasis or mild pneumonia. There is no evidence of pleural effusion or pneumothorax. The heart is mildly enlarged. No acute osseous abnormalities are seen. IMPRESSION: Mild vascular congestion and mild cardiomegaly noted. Mild bibasilar opacities may reflect atelectasis or mild pneumonia. Electronically Signed   By: Garald Balding M.D.   On: 04/14/2016 02:55    Lab Data:  CBC:  Recent Labs Lab 04/14/16 0149 04/15/16 0857  WBC 2.6* 2.8*  NEUTROABS  --  1.6*  HGB 12.3* 12.7*  HCT 36.9* 39.2  MCV 102.5* 105.4*  PLT 46* PENDING   Basic Metabolic Panel:  Recent Labs Lab 04/14/16 0149 04/15/16 0242    NA 136 138  K 3.1* 3.2*  CL 108 107  CO2 19* 22  GLUCOSE 87 104*  BUN 12 21*  CREATININE 1.39* 1.62*  CALCIUM 8.8* 9.1   GFR: Estimated Creatinine Clearance: 49.2 mL/min (by C-G formula based on Cr of 1.62). Liver Function Tests: No results for input(s): AST, ALT, ALKPHOS, BILITOT, PROT, ALBUMIN in the last  168 hours. No results for input(s): LIPASE, AMYLASE in the last 168 hours. No results for input(s): AMMONIA in the last 168 hours. Coagulation Profile: No results for input(s): INR, PROTIME in the last 168 hours. Cardiac Enzymes:  Recent Labs Lab 04/14/16 0724 04/14/16 1535  TROPONINI <0.03 <0.03   BNP (last 3 results) No results for input(s): PROBNP in the last 8760 hours. HbA1C: No results for input(s): HGBA1C in the last 72 hours. CBG: No results for input(s): GLUCAP in the last 168 hours. Lipid Profile: No results for input(s): CHOL, HDL, LDLCALC, TRIG, CHOLHDL, LDLDIRECT in the last 72 hours. Thyroid Function Tests: No results for input(s): TSH, T4TOTAL, FREET4, T3FREE, THYROIDAB in the last 72 hours. Anemia Panel: No results for input(s): VITAMINB12, FOLATE, FERRITIN, TIBC, IRON, RETICCTPCT in the last 72 hours. Urine analysis:    Component Value Date/Time   COLORURINE YELLOW 10/18/2013 1247   APPEARANCEUR CLEAR 10/18/2013 1247   LABSPEC 1.017 10/18/2013 1247   PHURINE 5.0 10/18/2013 1247   GLUCOSEU NEGATIVE 10/18/2013 1247   HGBUR NEGATIVE 10/18/2013 1247   BILIRUBINUR NEGATIVE 10/18/2013 1247   KETONESUR NEGATIVE 10/18/2013 1247   PROTEINUR NEGATIVE 10/18/2013 1247   UROBILINOGEN 0.2 10/18/2013 1247   NITRITE NEGATIVE 10/18/2013 1247   LEUKOCYTESUR NEGATIVE 10/18/2013 Faulk M.D. [facility] 04/15/2016, 11:31 AM  Pager: [phone] Between 7am to 7pm - call Pager - [phone]  After 7pm go to [email or web address] - password [email or web address]  Call night coverage person covering after 7pm

## 2016-04-15 NOTE — Progress Notes (Signed)
Advanced Heart Failure Rounding Note  Referring Physician: Dr. Tana Coast  PCP: Penni Homans, MD Primary Cardiologist: Dr. Haroldine Laws   Subjective:    Admitted 04/14/16 with acute SOB and CP, found to be markedly volume overloaded.   Feels about the same today with mild CP and SOB. Coughing as well.  Feels like his fluid is still up. Of note, Weight in AHF clnic 01/30/16 was 208 lbs.  Weighs 191 lbs by hospital scale this am.   Out 3.3 L and down 2 lbs on 20 mg IV lasix BID. Creatinine 1.39 -> 1.62. K remains low at 3.2.  Objective:   Weight Range: 191 lb 14.4 oz (87.045 kg) Body mass index is 26.02 kg/(m^2).   Vital Signs:   Temp:  [97.7 F (36.5 C)-98.7 F (37.1 C)] 98.1 F (36.7 C) (06/13 0504) Pulse Rate:  [68-87] 72 (06/13 0504) Resp:  [12-20] 20 (06/13 0504) BP: (124-158)/(75-121) 131/75 mmHg (06/13 0504) SpO2:  [97 %-100 %] 98 % (06/13 0808) Weight:  [191 lb 14.4 oz (87.045 kg)-193 lb 3.2 oz (87.635 kg)] 191 lb 14.4 oz (87.045 kg) (06/13 0504) Last BM Date: 04/15/16  Weight change: Filed Weights   04/14/16 1615 04/15/16 0504  Weight: 193 lb 3.2 oz (87.635 kg) 191 lb 14.4 oz (87.045 kg)    Intake/Output:   Intake/Output Summary (Last 24 hours) at 04/15/16 1191 Last data filed at 04/15/16 0809  Gross per 24 hour  Intake    360 ml  Output   1850 ml  Net  -1490 ml     Physical Exam: General: NAD, facial flushing.  Neck: JVP ~9-10 cm no thyromegaly or thyroid nodule.  Lungs: Scattered rhonchi CV: Nondisplaced PMI. Heart regular 2/6 TR P2 LLSB. No carotid bruit.  Abdomen: Soft, non-tender, non-distended, no HSM. No bruits or masses. +BS  Ext: warm, No clubbing, trace edema.Normal pedal pulses. Skin: Intact without lesions or rashes.  Neurologic: Alert and oriented x 3.  Psych: Normal affect.  Extremities: No clubbing or cyanosis.  HEENT: Normal. Except for erythematous rash/flushing  Telemetry: Reviewed, NSR 70s  Labs: CBC  Recent Labs  04/14/16 0149  WBC 2.6*  HGB 12.3*  HCT 36.9*  MCV 102.5*  PLT 46*   Basic Metabolic Panel  Recent Labs  04/14/16 0149 04/15/16 0242  NA 136 138  K 3.1* 3.2*  CL 108 107  CO2 19* 22  GLUCOSE 87 104*  BUN 12 21*  CREATININE 1.39* 1.62*  CALCIUM 8.8* 9.1   Liver Function Tests No results for input(s): AST, ALT, ALKPHOS, BILITOT, PROT, ALBUMIN in the last 72 hours. No results for input(s): LIPASE, AMYLASE in the last 72 hours. Cardiac Enzymes  Recent Labs  04/14/16 0724 04/14/16 1535  TROPONINI <0.03 <0.03    BNP: BNP (last 3 results)  Recent Labs  04/17/15 1245 11/13/15 1015 04/14/16 0149  BNP 324.5* 317.9* 543.9*    ProBNP (last 3 results) No results for input(s): PROBNP in the last 8760 hours.   D-Dimer  Recent Labs  04/14/16 0759  DDIMER 0.27   Hemoglobin A1C No results for input(s): HGBA1C in the last 72 hours. Fasting Lipid Panel No results for input(s): CHOL, HDL, LDLCALC, TRIG, CHOLHDL, LDLDIRECT in the last 72 hours. Thyroid Function Tests No results for input(s): TSH, T4TOTAL, T3FREE, THYROIDAB in the last 72 hours.  Invalid input(s): FREET3  Other results:     Imaging/Studies:  Dg Chest 2 View  04/14/2016  CLINICAL DATA:  Acute onset of centralized generalized  chest pain and shortness of breath. Initial encounter. EXAM: CHEST  2 VIEW COMPARISON:  Chest radiograph performed 12/13/2015, and CT of the chest performed 12/24/2015 FINDINGS: The lungs are well-aerated. Mild vascular congestion is noted. Mild bibasilar opacities may reflect atelectasis or mild pneumonia. There is no evidence of pleural effusion or pneumothorax. The heart is mildly enlarged. No acute osseous abnormalities are seen. IMPRESSION: Mild vascular congestion and mild cardiomegaly noted. Mild bibasilar opacities may reflect atelectasis or mild pneumonia. Electronically Signed   By: Garald Balding M.D.   On: 04/14/2016 02:55     Latest Echo  Latest  Cath   Medications:     Scheduled Medications: . furosemide  20 mg Intravenous Q12H  . hyoscyamine  0.375 mg Oral BID  . levothyroxine  25 mcg Oral QAC breakfast  . Macitentan  10 mg Oral Daily  . mometasone-formoterol  2 puff Inhalation Daily  . pantoprazole  40 mg Oral Daily  . potassium chloride SA  40 mEq Oral Daily  . Selexipag  1,000 mg Oral q morning - 10a  . Selexipag  800 mcg Oral Daily  . sodium chloride flush  3 mL Intravenous Q12H  . spironolactone  50 mg Oral Daily  . Tadalafil (PAH)  40 mg Oral Daily     Infusions:     PRN Medications:  sodium chloride, acetaminophen, albuterol, doxepin, ondansetron (ZOFRAN) IV, sodium chloride flush   Assessment   1. Acute on chronic diastolic HF 2. Chest pain 3. Severe PAH 4. Hypokalemia 5. Pancytopenia  Plan    Symptomatically unchanged.  CXR showed mild vascular congestion and bibasilar opacities that may reflect atelactasis or mild pneumonia.  With mild bump in Cr on low dose lasix, ? If symptoms more related to lungs than volume overload at this time.  Supp K+.   With ongoing symptoms despite some diuresis, likely need to consider R/L cath.  Will continue low dose IV lasix this am and discuss timing with MD. Troponins remain negative, so unlikely ACS.   With severe PAH despite triple therapy, may need consideration for IV prostanoids down the road.   Length of Stay:    Shirley Friar PA-C 04/15/2016, 8:22 AM  Advanced Heart Failure Team Pager [phone] (M-F; 7a - 4p)  Please contact [facility] for night-coverage after hours (4p -7a ) and weekends on [email or web address]  Agree.  Lott Seelbach,MD 11:11 PM

## 2016-04-15 NOTE — H&P (View-Only) (Signed)
Reason for Consult: Acute SOB with PAH   Referring Physician: Dr. Tana Coast   PCP:  Penni Homans, MD  Primary Cardiologist:Dr. Shaaron Golliday   Darren Mueller is an 65 y.o. male.    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  He underwent RHC 2/2017with pulmonary pressures higher since previous but cardiac improved. However PVR was still very high and RAP 11.  No LHC.  Continues with selexipag at 800 qam/ 1000 qpm. Unable to tolerate any further uptitration with HAs and worsening flushing. Remains on Macitentan and Adcirca.  Last seen in HF clinic 01/2016.  Has 2 vodka and tonics per day.  Today  At [place] presented with sudden onset SOB and chest pain while grilling, inhaler helped some. He was hypertensive at 180/110.  He had Rt upper chest pain sharp without radiation.  No nausea or diaphoresis.  He does have diarrhea that is ongoing from his Selexapeg per pt.  He used the inhaler again and SOB improved.  He was given NTG here and his chest pain resolved.  Though he has minimal now.  No increase of wt or lower ext edema.   K+ low at 3.1, Cr 1.39, BNP elevated at 543.  Troponin neg 0.02 and <0.03.  WBC 2.6, H/H 12.3/36.9 Plts 46 D-Dimer 0.27   CXR with mild vascular congestion.  Mild cardiomegaly.  EKG SR and similar to 12/2015 EKG with deep T wave inversions in V1-4.   Has rec'd 20 mg IV lasix, but continues to have some mild SOB, though he feels if active it would be worse.  He is negative 2,250.     PAH meds- unable to titrate further 1) Macitnentan 10 2) Adcirca 10 3) Selexapeg  Studies:  ECHO 12/14 EF 55-60% Peak PA pressure 35. Severe RV dysfunction  ECHO 7/15 EF 60% RV moderately to severely dilated. Moderate HK RVSP 68m HG  ECHO 6/16 EF 60% RV moderately to severely dilated. Severe HK RVSP ~65 mm HG. D-shaped  septum Echo 2/17 LVEF 60-65% RV massively dilated. Flat septum. Severe HK. Moderate TR RVSP ~65. IVC small. No effusion   RHC 2/17 RA = 11 RV = 80/16/19 PA = 83/61 (71) PCW = 9 Fick cardiac output/index = 5.6/2.6 PVR = 10.3 WU Ao sat = 94% PA sat = 65%, 68% High SVC sat = 65%  PFTs 1/17 FEV1 2.05 L (61%) FVC 2.48L (56%) DLCO 44%  EGD 03/27/16 small < 517mesophageal varices no changes, stable.  Past Medical History  Diagnosis Date  . Hypertension   . Seizures ([place]  . Hx of colonic polyps   . GERD (gastroesophageal reflux disease)   . Gout   . Alcoholic cirrhosis of liver with ascites ([place]2014  . Unspecified hypothyroidism 01/25/2013  . Unspecified pleural effusion   . Other chronic pulmonary heart diseases   . Hypopotassemia   . Renal insufficiency 07/18/2013  . Diarrhea 09/04/2013  . Otalgia 11/28/2013  . Dermatitis 06/10/2015  . Alcohol dependence in remission ([place]CT[place]5/19/2012    In remission   . Alcohol dependence (HCR[place]5/19/2012    In remission   . Red eye 03/03/2016    right  . Sleep apnea     does not wear CPAP  . Heart murmur     Past Surgical  History  Procedure Laterality Date  . Colonoscopy N/A 12/08/2013    Procedure: COLONOSCOPY;  Surgeon: Milus Banister, MD;  Location: [facility] ENDOSCOPY;  Service: Endoscopy;  Laterality: N/A;  . Right heart catheterization Right 06/06/2014    Procedure: RIGHT HEART CATH;  Surgeon: Jolaine Artist, MD;  Location: [facility] CATH LAB;  Service: Cardiovascular;  Laterality: Right;  . Uvulopalatopharyngoplasty  1999  . Cardiac catheterization N/A 12/21/2015    Procedure: Right Heart Cath;  Surgeon: Jolaine Artist, MD;  Location: [place] CV LAB;  Service: Cardiovascular;  Laterality: N/A;  . Esophagogastroduodenoscopy (egd) with propofol N/A 03/27/2016    Procedure: ESOPHAGOGASTRODUODENOSCOPY (EGD) WITH PROPOFOL;  Surgeon: Milus Banister, MD;  Location: [place];  Service: Endoscopy;  Laterality: N/A;    Family History   Problem Relation Age of Onset  . Heart disease Mother   . Heart attack Mother   . Hypertension Mother   . Prostate cancer Father   . Colon cancer Neg Hx    Social History:  reports that he quit smoking about 36 years ago. His smoking use included Cigarettes. He has a 10 pack-year smoking history. He has never used smokeless tobacco. He reports that he drinks alcohol. He reports that he does not use illicit drugs.  Allergies:  Allergies  Allergen Reactions  . Aspirin Other (See Comments)    hypertension    OUTPATIENT MEDICATIONS: No current facility-administered medications on file prior to encounter.   Current Outpatient Prescriptions on File Prior to Encounter  Medication Sig Dispense Refill  . albuterol (PROVENTIL HFA;VENTOLIN HFA) 108 (90 BASE) MCG/ACT inhaler Inhale 2 puffs into the lungs every 6 (six) hours as needed for wheezing or shortness of breath.    . doxepin (SINEQUAN) 10 MG capsule TAKE 1 TO 2 CAPSULES AT BEDTIME (Patient taking differently: TAKE 1 TO 2 CAPSULES AT BEDTIME as needed for sleep) 180 capsule 0  . Fluticasone Propionate 0.05 % LOTN Apply daily to affected area (Patient taking differently: Apply 1 application topically daily. Apply daily to affected area) 3 Bottle 3  . furosemide (LASIX) 40 MG tablet Take 40 mg by mouth every Monday, Wednesday, and Friday.     . levothyroxine (SYNTHROID, LEVOTHROID) 25 MCG tablet Take 1 tablet (25 mcg total) by mouth daily. 90 tablet 1  . loperamide (ANTI-DIARRHEAL) 2 MG capsule Take 4 mg by mouth daily.     Marland Kitchen losartan (COZAAR) 50 MG tablet TAKE 1 TABLET DAILY 90 tablet 2  . Macitentan (OPSUMIT) 10 MG TABS Take 10 mg by mouth daily. 90 tablet 3  . mometasone-formoterol (DULERA) 100-5 MCG/ACT AERO Inhale 2 puffs into the lungs daily.     Marland Kitchen omeprazole (PRILOSEC) 40 MG capsule Take 1 capsule (40 mg total) by mouth daily. 90 capsule 2  . potassium chloride (K-DUR) 10 MEQ tablet Take 1 tablet (10 mEq total) by mouth daily.  (Patient taking differently: Take 10 mEq by mouth 2 (two) times daily. ) 360 tablet 3  . Selexipag (UPTRAVI) 1000 MCG TABS Take 1,000 mg by mouth every morning. (Patient taking differently: Take 1,000 mg by mouth at bedtime. ) 90 tablet 2  . Selexipag (UPTRAVI) 800 MCG TABS Take 1 tablet (800 mcg total) by mouth every evening. (Patient taking differently: Take 800 mcg by mouth daily. ) 90 tablet 2  . spironolactone (ALDACTONE) 50 MG tablet Take 1 tablet (50 mg total) by mouth daily. 90 tablet 1  . SYMAX-SR 0.375 MG 12 hr tablet TAKE 1 TABLET TWICE  A DAY 180 tablet 1  . Tadalafil, PAH, (ADCIRCA) 20 MG TABS TAKE 2 TABLETS (40 MG TOTAL) DAILY 180 tablet 2  . verapamil (VERELAN PM) 180 MG 24 hr capsule Take 1 capsule (180 mg total) by mouth at bedtime. (Patient taking differently: Take 180 mg by mouth daily. ) 90 capsule 1     Results for orders placed or performed during the hospital encounter of 04/14/16 (from the past 48 hour(s))  Basic metabolic panel     Status: Abnormal   Collection Time: 04/14/16  1:49 AM  Result Value Ref Range   Sodium 136 135 - 145 mmol/L   Potassium 3.1 (L) 3.5 - 5.1 mmol/L   Chloride 108 101 - 111 mmol/L   CO2 19 (L) 22 - 32 mmol/L   Glucose, Bld 87 65 - 99 mg/dL   BUN 12 6 - 20 mg/dL   Creatinine, Ser 1.39 (H) 0.61 - 1.24 mg/dL   Calcium 8.8 (L) 8.9 - 10.3 mg/dL   GFR calc non Af Amer 51 (L) >60 mL/min   GFR calc Af Amer 59 (L) >60 mL/min    Comment: (NOTE) The eGFR has been calculated using the CKD EPI equation. This calculation has not been validated in all clinical situations. eGFR's persistently <60 mL/min signify possible Chronic Kidney Disease.    Anion gap 9 5 - 15  CBC     Status: Abnormal   Collection Time: 04/14/16  1:49 AM  Result Value Ref Range   WBC 2.6 (L) 4.0 - 10.5 K/uL   RBC 3.60 (L) 4.22 - 5.81 MIL/uL   Hemoglobin 12.3 (L) 13.0 - 17.0 g/dL   HCT 36.9 (L) 39.0 - 52.0 %   MCV 102.5 (H) 78.0 - 100.0 fL   MCH 34.2 (H) 26.0 - 34.0 pg    MCHC 33.3 30.0 - 36.0 g/dL   RDW 15.6 (H) 11.5 - 15.5 %   Platelets 46 (L) 150 - 400 K/uL    Comment: SPECIMEN CHECKED FOR CLOTS PLATELET COUNT CONFIRMED BY SMEAR   Brain natriuretic peptide     Status: Abnormal   Collection Time: 04/14/16  1:49 AM  Result Value Ref Range   B Natriuretic Peptide 543.9 (H) 0.0 - 100.0 pg/mL  I-stat troponin, ED     Status: None   Collection Time: 04/14/16  1:53 AM  Result Value Ref Range   Troponin i, poc 0.02 0.00 - 0.08 ng/mL   Comment 3            Comment: Due to the release kinetics of cTnI, a negative result within the first hours of the onset of symptoms does not rule out myocardial infarction with certainty. If myocardial infarction is still suspected, repeat the test at appropriate intervals.   Troponin I     Status: None   Collection Time: 04/14/16  7:24 AM  Result Value Ref Range   Troponin I <0.03 <0.031 ng/mL    Comment:        NO INDICATION OF MYOCARDIAL INJURY.   D-dimer, quantitative (not at [facility])     Status: None   Collection Time: 04/14/16  7:59 AM  Result Value Ref Range   D-Dimer, Quant 0.27 0.00 - 0.50 ug/mL-FEU    Comment: (NOTE) At the manufacturer cut-off of 0.50 ug/mL FEU, this assay has been documented to exclude PE with a sensitivity and negative predictive value of 97 to 99%.  At this time, this assay has not been approved by the FDA to exclude   DVT/VTE. Results should be correlated with clinical presentation.    Dg Chest 2 View  04/14/2016  CLINICAL DATA:  Acute onset of centralized generalized chest pain and shortness of breath. Initial encounter. EXAM: CHEST  2 VIEW COMPARISON:  Chest radiograph performed 12/13/2015, and CT of the chest performed 12/24/2015 FINDINGS: The lungs are well-aerated. Mild vascular congestion is noted. Mild bibasilar opacities may reflect atelectasis or mild pneumonia. There is no evidence of pleural effusion or pneumothorax. The heart is mildly enlarged. No acute osseous abnormalities  are seen. IMPRESSION: Mild vascular congestion and mild cardiomegaly noted. Mild bibasilar opacities may reflect atelectasis or mild pneumonia. Electronically Signed   By: Jeffery  Chang M.D.   On: 04/14/2016 02:55    ROS: General:no colds or fevers, no weight increases mild loss Skin:no rashes or ulcers HEENT:no blurred vision, no congestion CV:see HPI PUL:see HPI GI:+ diarrhea no constipation or melena, no indigestion GU:no hematuria, no dysuria MS:no joint pain, no claudication Neuro:no syncope, no lightheadedness, no seizures Endo:no diabetes, no thyroid disease + sleep apnea unable to tolerate CPAP--     Blood pressure 139/121, pulse 71, temperature 97.7 F (36.5 C), temperature source Oral, resp. rate 17, SpO2 99 %.  Wt Readings from Last 3 Encounters:  03/11/16 203 lb 12.8 oz (92.443 kg)  03/03/16 203 lb 6 oz (92.25 kg)  01/30/16 208 lb (94.348 kg)    PE:General:Pleasant affect, NAD Skin:Warm and dry, brisk capillary refill HEENT:normocephalic, sclera clear, mucus membranes moist Neck:supple, + JVD, no bruits  Heart:S1S2 RRR with soft systolic murmur, gallup, rub or click Lungs: with rales bases, no  rhonchi, or wheezes Abd:soft, non tender, + BS, do not palpate liver spleen or masses Ext:+ lower ext edema, mostly non pitting.  2+ pedal pulses, 2+ radial pulses Neuro:alert and oriented X 3, MAE, follows commands, + facial symmetry   Assessment/Plan Principal Problem:   Systolic CHF, acute on chronic (HCC) Active Problems:   Hypertension   Seizure disorder (HCC)   Hypokalemia   GERD (gastroesophageal reflux disease)   Secondary pulmonary hypertension (HCC)   Hypothyroidism   CHF exacerbation (HCC)  1. Chest pain with acute SOB, relief with NTG, was hypertensive as well.   Troponin neg.  EKG without acute changes.   - repeat  Troponin has been ordered. -he has not had a Lt heart cath but with plts at 46 would be high risk.     2.  Acute Rt HF, with hx normal EF  60-65% with severe PAH-  -is diuresing from 20 mg IV lasix. Now neg 2250  May need more diuresing.   3. Severe PAH on triple therapy, may need IV medication- Dr.  to see.   4. Hypokalemia 3.1 on admit, may have been from diarrhea?  Though he states this is chronic problem.   5. Pancytopenia with plts at 46 and WBC at 2.6.  Laura Ingold  Nurse Practitioner Certified [facility]ART[facility] Pager [phone] or after 5pm or weekends call [phone] 04/14/2016, 2:54 PM   Patient seen and examined with Laura Ingold, NP-C. We discussed all aspects of the encounter. I agree with the assessment and plan as stated above.   He is markedly volume overloaded and suspect CP and dyspnea related to HF. However he is also at risk for CAD. He has had multiple right heart caths but has not had coronary angio. He would be at high risk for coronary intervention with low platelets. Will continue diuresis and assess symptoms response. He   also has severe PAH despite triple therapy and may require IV prostanoids at some point. If remains symptomatic in am may need to consider R/L cath. We will follow closely. Would not use IV heparin unless troponins are positive.   Ousmane Seeman,MD 6:41 PM

## 2016-04-15 NOTE — Interval H&P Note (Signed)
History and Physical Interval Note:  04/15/2016 5:44 PM  Darren Mueller  has presented today for surgery, with the diagnosis of CP/PAH  The various methods of treatment have been discussed with the patient and family. After consideration of risks, benefits and other options for treatment, the patient has consented to  Procedure(s): Right/Left Heart Cath and Coronary Angiography (N/A) and possible coronary angioplasty as a surgical intervention .  The patient's history has been reviewed, patient examined, no change in status, stable for surgery.  I have reviewed the patient's chart and labs.  Questions were answered to the patient's satisfaction.     Krishauna Schatzman, [name]

## 2016-04-15 NOTE — Progress Notes (Signed)
Patient not comfortable taking aspirin and wanted me to follow up with Nira Conn, RN in heart failure clinic.  Nira Conn was called and talked with Dr. Haroldine Laws who stated that it was ok not to take aspirin.

## 2016-04-16 ENCOUNTER — Observation Stay (HOSPITAL_COMMUNITY): Payer: Medicare Other

## 2016-04-16 ENCOUNTER — Telehealth (HOSPITAL_COMMUNITY): Payer: Self-pay | Admitting: *Deleted

## 2016-04-16 ENCOUNTER — Encounter (HOSPITAL_COMMUNITY): Payer: Self-pay | Admitting: Internal Medicine

## 2016-04-16 DIAGNOSIS — I5023 Acute on chronic systolic (congestive) heart failure: Secondary | ICD-10-CM | POA: Diagnosis not present

## 2016-04-16 DIAGNOSIS — I110 Hypertensive heart disease with heart failure: Secondary | ICD-10-CM | POA: Diagnosis not present

## 2016-04-16 DIAGNOSIS — R05 Cough: Secondary | ICD-10-CM | POA: Diagnosis not present

## 2016-04-16 DIAGNOSIS — R0602 Shortness of breath: Secondary | ICD-10-CM | POA: Diagnosis not present

## 2016-04-16 DIAGNOSIS — R072 Precordial pain: Secondary | ICD-10-CM

## 2016-04-16 DIAGNOSIS — K219 Gastro-esophageal reflux disease without esophagitis: Secondary | ICD-10-CM | POA: Diagnosis not present

## 2016-04-16 DIAGNOSIS — I272 Other secondary pulmonary hypertension: Secondary | ICD-10-CM | POA: Diagnosis not present

## 2016-04-16 DIAGNOSIS — I509 Heart failure, unspecified: Secondary | ICD-10-CM | POA: Diagnosis not present

## 2016-04-16 LAB — CBC
HCT: 38.4 % — ABNORMAL LOW (ref 39.0–52.0)
HEMOGLOBIN: 12.4 g/dL — AB (ref 13.0–17.0)
MCH: 34.4 pg — AB (ref 26.0–34.0)
MCHC: 32.3 g/dL (ref 30.0–36.0)
MCV: 106.7 fL — ABNORMAL HIGH (ref 78.0–100.0)
PLATELETS: 38 10*3/uL — AB (ref 150–400)
RBC: 3.6 MIL/uL — AB (ref 4.22–5.81)
RDW: 16 % — ABNORMAL HIGH (ref 11.5–15.5)
WBC: 2.3 10*3/uL — AB (ref 4.0–10.5)

## 2016-04-16 LAB — BASIC METABOLIC PANEL
ANION GAP: 9 (ref 5–15)
BUN: 24 mg/dL — ABNORMAL HIGH (ref 6–20)
CALCIUM: 8.8 mg/dL — AB (ref 8.9–10.3)
CO2: 19 mmol/L — ABNORMAL LOW (ref 22–32)
CREATININE: 1.59 mg/dL — AB (ref 0.61–1.24)
Chloride: 109 mmol/L (ref 101–111)
GFR, EST AFRICAN AMERICAN: 51 mL/min — AB (ref >60)
GFR, EST NON AFRICAN AMERICAN: 44 mL/min — AB (ref >60)
Glucose, Bld: 105 mg/dL — ABNORMAL HIGH (ref 65–99)
Potassium: 3.7 mmol/L (ref 3.5–5.1)
SODIUM: 137 mmol/L (ref 135–145)

## 2016-04-16 MED ORDER — POTASSIUM CHLORIDE ER 20 MEQ PO TBCR: 20.0000 meq | EXTENDED_RELEASE_TABLET | Freq: Every day | ORAL | Status: DC

## 2016-04-16 MED ORDER — LOSARTAN POTASSIUM 50 MG PO TABS: 50.0000 mg | ORAL_TABLET | Freq: Every day | ORAL | Status: DC

## 2016-04-16 MED ORDER — FUROSEMIDE 40 MG PO TABS: 40.0000 mg | ORAL_TABLET | Freq: Every day | ORAL | Status: DC

## 2016-04-16 NOTE — Discharge Summary (Signed)
Physician Discharge Summary   Patient ID: Darren Mueller MRN: [id number] DOB/AGE: 66/22/1952 66 y.o.  Admit date: 04/14/2016 Discharge date: 04/16/2016  Primary Care Physician:  Penni Homans, MD  Discharge Diagnoses:   . Systolic CHF, acute on chronic (HCC) . Hypertension . Hypokalemia . GERD (gastroesophageal reflux disease) . Secondary pulmonary hypertension ([place]) . Hypothyroidism   Consults: Cardiology  Recommendations for Outpatient Follow-up:  1. Please repeat CBC/BMET at next visit   DIET: Heart healthy diet    Allergies:   Allergies  Allergen Reactions  . Aspirin Other (See Comments)    hypertension     DISCHARGE MEDICATIONS: Current Discharge Medication List    CONTINUE these medications which have CHANGED   Details  furosemide (LASIX) 40 MG tablet Take 1 tablet (40 mg total) by mouth daily. Qty: 30 tablet, Refills: 3    losartan (COZAAR) 50 MG tablet Take 1 tablet (50 mg total) by mouth daily. HOLD until your follow-up appointment Qty: 90 tablet, Refills: 2    potassium chloride 20 MEQ TBCR Take 20 mEq by mouth daily. Qty: 30 tablet, Refills: 3   Associated Diagnoses: Chronic systolic heart failure ([place])      CONTINUE these medications which have NOT CHANGED   Details  albuterol (PROVENTIL HFA;VENTOLIN HFA) 108 (90 BASE) MCG/ACT inhaler Inhale 2 puffs into the lungs every 6 (six) hours as needed for wheezing or shortness of breath.    Fluticasone Propionate 0.05 % LOTN Apply daily to affected area Qty: 3 Bottle, Refills: 3    levothyroxine (SYNTHROID, LEVOTHROID) 25 MCG tablet Take 1 tablet (25 mcg total) by mouth daily. Qty: 90 tablet, Refills: 1    loperamide (ANTI-DIARRHEAL) 2 MG capsule Take 4 mg by mouth daily.     Macitentan (OPSUMIT) 10 MG TABS Take 10 mg by mouth daily. Qty: 90 tablet, Refills: 3   Associated Diagnoses: Other chronic pulmonary heart diseases    mometasone-formoterol (DULERA) 100-5 MCG/ACT AERO Inhale 2 puffs into  the lungs daily.     omeprazole (PRILOSEC) 40 MG capsule Take 1 capsule (40 mg total) by mouth daily. Qty: 90 capsule, Refills: 2    !! Selexipag (UPTRAVI) 1000 MCG TABS Take 1,000 mg by mouth every morning. Qty: 90 tablet, Refills: 2    !! Selexipag (UPTRAVI) 800 MCG TABS Take 1 tablet (800 mcg total) by mouth every evening. Qty: 90 tablet, Refills: 2    spironolactone (ALDACTONE) 50 MG tablet Take 1 tablet (50 mg total) by mouth daily. Qty: 90 tablet, Refills: 1    SYMAX-SR 0.375 MG 12 hr tablet TAKE 1 TABLET TWICE A DAY Qty: 180 tablet, Refills: 1    Tadalafil, PAH, (ADCIRCA) 20 MG TABS TAKE 2 TABLETS (40 MG TOTAL) DAILY Qty: 180 tablet, Refills: 2   Associated Diagnoses: Secondary pulmonary hypertension (HCC)    doxepin (SINEQUAN) 10 MG capsule TAKE 1 TO 2 CAPSULES AT BEDTIME Qty: 180 capsule, Refills: 1     !! - Potential duplicate medications found. Please discuss with provider.    STOP taking these medications     verapamil (VERELAN PM) 180 MG 24 hr capsule          Brief H and P: For complete details please refer to admission H and P, but in [place] is a 65 y.o. male with medical history significant for pHTN, right sided heart failure who is being admitted for chest pain and acute heart failure exacerbation. He was in his usual state of health until 11:30pm last night 04/13/16  when he had sudden onset of right sided sharp chest pain associated with SOB. Took a puff of albuterol inhaler which helped pain and SOB momentarily, but both came back after a couple minutes.  Chest x-ray showed pulmonary edema, chest pain was resolved after addition of nitro paste BNP elevated.  Hospital Course:   Chest pain, Systolic CHF, acute on chronic (HCC) With underlying history of pulmonary hypertension:  - Chest pain  resolved, troponins 3 negative, BNP 543.9 - D-dimer negative - 2-D echo in February 2017 showed normal EF, grade 1 diastolic dysfunction, PA pressure  over 80  - Patient underwent left and right heart cath Which showed minimal CAD, moderate pulmonary hypertension, improved from previous, normal left-sided pressures after diuresis, recommended medical therapy.   Pulmonary Hypertension in the setting of cirrhosis, OSA, COPD - On macitentan, adcirca, and selexipag per cardiology   Hypertension - Stable on current regimen, Lasix, Aldactone - Lasix dose changed to 40 mg daily outpatient per CHF team recommendations  Hypokalemia - Likely due to diuresis, replaced, patient placed on potassium 20 mg daily outpatient  OSA - Intolerant of CPAP, continue O2 via nasal cannula  Pancytopenia, liver cirrhosis, thrombocytopenia - Has followed hematology, Dr. Alen Blew    GERD (gastroesophageal reflux disease) - Continue PPI   Hypothyroidism - Continue Synthroid, check TSH  History of liver cirrhosis  -Following GI, Dr. Ardis Hughs, likely due to alcohol abuse and RV failure  - Currently stable, no evidence of hepatic encephalopathy  -On Lasix and Aldactone  Day of Discharge BP 130/78 mmHg  Pulse 82  Temp(Src) 98.6 F (37 C) (Oral)  Resp 16  Ht 6' (1.829 m)  Wt 86.229 kg (190 lb 1.6 oz)  BMI 25.78 kg/m2  SpO2 98%  Physical Exam: General: Alert and awake oriented x3 not in any acute distress. HEENT: anicteric sclera, pupils reactive to light and accommodation CVS: S1-S2 clear no murmur rubs or gallops Chest: clear to auscultation bilaterally, no wheezing rales or rhonchi Abdomen: soft nontender, nondistended, normal bowel sounds Extremities: no cyanosis, clubbing or edema noted bilaterally Neuro: Cranial nerves II-XII intact, no focal neurological deficits   The results of significant diagnostics from this hospitalization (including imaging, microbiology, ancillary and laboratory) are listed below for reference.    LAB RESULTS: Basic Metabolic Panel:  Recent Labs Lab 04/15/16 0242 04/16/16 0324  NA 138 137  K 3.2*  3.7  CL 107 109  CO2 22 19*  GLUCOSE 104* 105*  BUN 21* 24*  CREATININE 1.62* 1.59*  CALCIUM 9.1 8.8*   Liver Function Tests: No results for input(s): AST, ALT, ALKPHOS, BILITOT, PROT, ALBUMIN in the last 168 hours. No results for input(s): LIPASE, AMYLASE in the last 168 hours. No results for input(s): AMMONIA in the last 168 hours. CBC:  Recent Labs Lab 04/15/16 0857 04/16/16 0324  WBC 2.8* 2.3*  NEUTROABS 1.6*  --   HGB 12.7* 12.4*  HCT 39.2 38.4*  MCV 105.4* 106.7*  PLT 44* 38*   Cardiac Enzymes:  Recent Labs Lab 04/14/16 0724 04/14/16 1535  TROPONINI <0.03 <0.03   BNP: Invalid input(s): POCBNP CBG: No results for input(s): GLUCAP in the last 168 hours.  Significant Diagnostic Studies:  Dg Chest 2 View  04/14/2016  CLINICAL DATA:  Acute onset of centralized generalized chest pain and shortness of breath. Initial encounter. EXAM: CHEST  2 VIEW COMPARISON:  Chest radiograph performed 12/13/2015, and CT of the chest performed 12/24/2015 FINDINGS: The lungs are well-aerated. Mild vascular congestion is noted. Mild  bibasilar opacities may reflect atelectasis or mild pneumonia. There is no evidence of pleural effusion or pneumothorax. The heart is mildly enlarged. No acute osseous abnormalities are seen. IMPRESSION: Mild vascular congestion and mild cardiomegaly noted. Mild bibasilar opacities may reflect atelectasis or mild pneumonia. Electronically Signed   By: Garald Balding M.D.   On: 04/14/2016 02:55    2D ECHO: Study Conclusions  - Left ventricle: The cavity size was normal. There was moderate  focal basal hypertrophy of the septum. Systolic function was  normal. The estimated ejection fraction was in the range of 55%  to 60%. Wall motion was normal; there were no regional wall  motion abnormalities. Doppler parameters are consistent with  abnormal left ventricular relaxation (grade 1 diastolic  dysfunction). - Ventricular septum: The contour showed  diastolic flattening. - Aortic valve: There was mild regurgitation. - Mitral valve: Calcified annulus. - Right ventricle: The cavity size was severely dilated. Wall  thickness was normal. Systolic function was severely reduced. - Pulmonary arteries: Systolic pressure was moderately increased.  PA peak pressure: 55 mm Hg (S).  Right and left cardiac cath Findings:  Ao = 107/70 (87) LV = 106/3/11 RA = 4 RV = 74/11 PA = 74/26 (45) PCW = 6 Fick cardiac output/index = 4.6.2.2 PVR = 8.5 Ao sat = 95% PA sat = 61%, 58%  Assessment: 1. Minimal CAD 2. Moderate pulmonary HTN (improved from previous cath) 3. Normal left sided pressures after diuresis  Plan/Discussion:  Continue medical therapy.    Disposition and Follow-up: Discharge Instructions    (HEART FAILURE PATIENTS) Call MD:  Anytime you have any of the following symptoms: 1) 3 pound weight gain in 24 hours or 5 pounds in 1 week 2) shortness of breath, with or without a dry hacking cough 3) swelling in the hands, feet or stomach 4) if you have to sleep on extra pillows at night in order to breathe.    Complete by:  As directed      Diet - low sodium heart healthy    Complete by:  As directed      Increase activity slowly    Complete by:  As directed             DISPOSITION: Home   DISCHARGE FOLLOW-UP Follow-up Information    Follow up with Penni Homans, MD On 04/28/2016.   Specialty:  Family Medicine   Why:  At 1:00pm ... for hospital follow up   Contact information:   [place] [address] [phone]       Follow up with Glori Bickers, MD. Go on 05/01/2016.   Specialty:  Cardiology   Why:  at 11 am for post hospital follow up. Please bring all of your medications to your visit. The code for parking is [place] information:   [address] [place] [address] [phone]        Time spent on Discharge: 25 minutes  Signed:   RAI,RIPUDEEP  M.D. [facility] 04/16/2016, 9:25 AM Pager: [phone]

## 2016-04-16 NOTE — Progress Notes (Signed)
Advanced Heart Failure Rounding Note  Referring Physician: Dr. Tana Coast  PCP: Penni Homans, MD Primary Cardiologist: Dr. Haroldine Laws   Subjective:    Admitted 04/14/16 with acute SOB and CP, found to be markedly volume overloaded.   Pulmonary HTN improved on RHC yesterday, full results as below. Pt states he is feeling better this morning. Although he does have some cough and is hoarse.   Out 600 cc and down an additional lb. 3.3 L and down 2 lbs on 20 mg IV lasix BID. Creatinine stable 1.62 -> 1.59. K 3.7   R/LHC 04/15/16 Left angio - Mid RCA lesion, 20% stenosed. - Dist LAD lesion, 20% stenosed. Right Hemodynamics Ao = 107/70 (87) LV = 106/3/11 RA = 4 RV = 74/11 PA = 74/26 (45) PCW = 6 Fick cardiac output/index = 4.6.2.2 PVR = 8.5 Ao sat = 95% PA sat = 61%, 58%  Echo 04/15/16 LVEF 55-60%, Grade 1 DD, PA peak pressure 55 mm Hg.   Objective:   Weight Range: 190 lb 1.6 oz (86.229 kg) Body mass index is 25.78 kg/(m^2).   Vital Signs:   Temp:  [97.8 F (36.6 C)-98.6 F (37 C)] 98.6 F (37 C) (06/14 0353) Pulse Rate:  [67-82] 82 (06/14 0353) Resp:  [4-67] 16 (06/14 0353) BP: (112-152)/(74-101) 130/78 mmHg (06/14 0353) SpO2:  [95 %-100 %] 98 % (06/14 0353) Weight:  [190 lb 1.6 oz (86.229 kg)] 190 lb 1.6 oz (86.229 kg) (06/14 0353) Last BM Date: 04/15/16  Weight change: Filed Weights   04/14/16 1615 04/15/16 0504 04/16/16 0353  Weight: 193 lb 3.2 oz (87.635 kg) 191 lb 14.4 oz (87.045 kg) 190 lb 1.6 oz (86.229 kg)    Intake/Output:   Intake/Output Summary (Last 24 hours) at 04/16/16 0749 Last data filed at 04/16/16 0356  Gross per 24 hour  Intake    803 ml  Output   1200 ml  Net   -397 ml     Physical Exam: General: NAD, facial flushing.  Neck: JVP ~7-8 cm no thyromegaly or thyroid nodule.  Lungs: Mildly diminished basilar sounds.  CV: Nondisplaced PMI. Heart regular 2/6 TR P2 LLSB. No carotid bruit.  Abdomen: Soft, NT, ND, no HSM. No bruits or  masses. +BS   Ext: warm, No clubbing, trace edema.Normal pedal pulses. Skin: Intact without lesions or rashes.  Neurologic: Alert and oriented x 3.  Psych: Normal affect.  Extremities: No clubbing or cyanosis. R groin site without tenderness or hematoma.  R radial site without bruit, tenderness, or hematoma.  Mild ecchymosis to both.  HEENT: Normal. Except for erythematous rash/flushing  Telemetry: Reviewed, NSR 70s  Labs: CBC  Recent Labs  04/15/16 0857 04/16/16 0324  WBC 2.8* 2.3*  NEUTROABS 1.6*  --   HGB 12.7* 12.4*  HCT 39.2 38.4*  MCV 105.4* 106.7*  PLT 44* 38*   Basic Metabolic Panel  Recent Labs  04/15/16 0242 04/16/16 0324  NA 138 137  K 3.2* 3.7  CL 107 109  CO2 22 19*  GLUCOSE 104* 105*  BUN 21* 24*  CREATININE 1.62* 1.59*  CALCIUM 9.1 8.8*   Liver Function Tests No results for input(s): AST, ALT, ALKPHOS, BILITOT, PROT, ALBUMIN in the last 72 hours. No results for input(s): LIPASE, AMYLASE in the last 72 hours. Cardiac Enzymes  Recent Labs  04/14/16 0724 04/14/16 1535  TROPONINI <0.03 <0.03    BNP: BNP (last 3 results)  Recent Labs  04/17/15 1245 11/13/15 1015 04/14/16 0149  BNP 324.5*  317.9* 543.9*    ProBNP (last 3 results) No results for input(s): PROBNP in the last 8760 hours.   D-Dimer  Recent Labs  04/14/16 0759  DDIMER 0.27   Hemoglobin A1C No results for input(s): HGBA1C in the last 72 hours. Fasting Lipid Panel No results for input(s): CHOL, HDL, LDLCALC, TRIG, CHOLHDL, LDLDIRECT in the last 72 hours. Thyroid Function Tests No results for input(s): TSH, T4TOTAL, T3FREE, THYROIDAB in the last 72 hours.  Invalid input(s): FREET3  Other results:     Imaging/Studies:  No results found.  Latest Echo  Latest Cath   Medications:     Scheduled Medications: . hyoscyamine  0.375 mg Oral BID  . levothyroxine  25 mcg Oral QAC breakfast  . Macitentan  10 mg Oral Daily  . mometasone-formoterol  2 puff  Inhalation Daily  . pantoprazole  40 mg Oral Daily  . potassium chloride SA  40 mEq Oral Daily  . Selexipag  1,000 mcg Oral q morning - 10a  . Selexipag  800 mcg Oral QHS  . sodium chloride flush  3 mL Intravenous Q12H  . sodium chloride flush  3 mL Intravenous Q12H  . spironolactone  50 mg Oral Daily  . Tadalafil (PAH)  40 mg Oral Daily    Infusions:    PRN Medications: sodium chloride, sodium chloride, acetaminophen, acetaminophen, albuterol, doxepin, ondansetron (ZOFRAN) IV, ondansetron (ZOFRAN) IV, sodium chloride flush, sodium chloride flush   Assessment   1. Acute on chronic diastolic HF 2. Chest pain 3. Severe PAH 4. Hypokalemia 5. Pancytopenia  Plan    Cath yesterday with minimal CAD and moderate pulmonary HTN, which is improved from previous cath now that he is on triple therapy.   RA = 4 consistent with adequate diuresis.    Can start back on lasix 40 mg daily with potassium 20 meq daily.   Troponins negative, and only minimal CAD on cath. Very low suspicion for any ACS.   Pt is likely stable for discharge to home today. Renal function stable and procedure sites unremarkable.   Length of Stay:    Shirley Friar PA-C 04/16/2016, 7:49 AM  Advanced Heart Failure Team Pager [phone] (M-F; 7a - 4p)  Please contact [facility] for night-coverage after hours (4p -7a ) and weekends on [email or web address]   Patient seen and examined with Oda Kilts, PA-C. We discussed all aspects of the encounter. I agree with the assessment and plan as stated above.   Cath results discussed. PA pressures actually improved. Coronaries fine. Can go home today. F/u in Clinic.   Faithann Natal,MD 6:07 PM

## 2016-04-16 NOTE — Care Management Obs Status (Signed)
[facility] [facility] NOTIFICATION   Patient Details  Name: Darren Mueller MRN: [id number] Date of Birth: Mar 05, 1951   Medicare Observation Status Notification Given:  Yes    Royston Bake, RN 04/16/2016, 9:41 AM

## 2016-04-16 NOTE — Telephone Encounter (Signed)
Pt called to report a cough, he states it started while he was in the hospital and they did chest x-ray before he left but he did not hear results and his cough has gotten worse.  Advised x-ray showed lungs were clear, advised can take Coricidin or Robitussin OTC as needed

## 2016-04-16 NOTE — Progress Notes (Addendum)
Received report from Cath Lab. Patient return to the floor without any complications. Femoral site at level 0. RT Band intact at 14. Will continue to monitor. Frequent vital signs to ensure safety. Will continue to monitor.

## 2016-04-17 ENCOUNTER — Telehealth: Payer: Self-pay | Admitting: Family Medicine

## 2016-04-17 NOTE — Telephone Encounter (Signed)
Pt called in, states that he  is returning RN call for lab results.    CB: [phone]

## 2016-04-17 NOTE — Telephone Encounter (Signed)
Transition Care Management Follow-up Telephone Call  Admit date: 04/14/2016 Discharge date: 04/16/2016  Primary Care Physician: Penni Homans, MD  Discharge Diagnoses:  . Systolic CHF, acute on chronic (HCC) . Hypertension . Hypokalemia . GERD (gastroesophageal reflux disease) . Secondary pulmonary hypertension ([place]) . Hypothyroidism   Consults: Cardiology  Recommendations for Outpatient Follow-up:  1. Please repeat CBC/BMET at next visit   DIET: Heart healthy diet    How have you been since you were released from the hospital? "Not too good. I have a cold and cough."   Do you understand why you were in the hospital? yes   Do you understand the discharge instructions? yes   Where were you discharged to? Home   Items Reviewed:  Medications reviewed: yes  Allergies reviewed: yes  Dietary changes reviewed: yes, heart healthy  Referrals reviewed: no, none made per pt   Functional Questionnaire:   Activities of Daily Living (ADLs):   He states they are independent in the following: ambulation, bathing and hygiene, feeding, continence, grooming, toileting and dressing States they require assistance with the following: none   Any transportation issues/concerns?: no   Any patient concerns? Yes, he is currently experiencing cough and cold symptoms. Discharge summary shows instructions for pt to stop verapamil, however, his AVS did not have these instructions. Advised pt to hold verapamil until appt. He is also holding losartan as directed.   Confirmed importance and date/time of follow-up visits scheduled yes  Provider Appointment booked with Mackie Pai, PA-C 04/28/16 @ 1:00pm  Confirmed with patient if condition begins to worsen call PCP or go to the ER.  Patient was given the office number and encouraged to call back with question or concerns.  : yes

## 2016-04-28 ENCOUNTER — Telehealth: Payer: Self-pay | Admitting: Medical

## 2016-04-28 ENCOUNTER — Ambulatory Visit (HOSPITAL_BASED_OUTPATIENT_CLINIC_OR_DEPARTMENT_OTHER)
Admission: RE | Admit: 2016-04-28 | Discharge: 2016-04-28 | Disposition: A | Discharge status: Home / Self Care | Payer: Medicare Other | Source: Ambulatory Visit | Attending: Medical | Admitting: Medical

## 2016-04-28 ENCOUNTER — Encounter: Payer: Self-pay | Admitting: Medical

## 2016-04-28 ENCOUNTER — Ambulatory Visit (INDEPENDENT_AMBULATORY_CARE_PROVIDER_SITE_OTHER): Payer: Medicare Other | Admitting: Medical

## 2016-04-28 ENCOUNTER — Telehealth: Payer: Self-pay | Admitting: Emergency Medicine

## 2016-04-28 VITALS — BP 120/74 | HR 89 | Temp 98.1°F | Ht 72.0 in | Wt 195.2 lb

## 2016-04-28 DIAGNOSIS — R05 Cough: Secondary | ICD-10-CM

## 2016-04-28 DIAGNOSIS — E876 Hypokalemia: Secondary | ICD-10-CM

## 2016-04-28 DIAGNOSIS — I5020 Unspecified systolic (congestive) heart failure: Secondary | ICD-10-CM

## 2016-04-28 DIAGNOSIS — D696 Thrombocytopenia, unspecified: Secondary | ICD-10-CM

## 2016-04-28 DIAGNOSIS — I2721 Secondary pulmonary arterial hypertension: Secondary | ICD-10-CM

## 2016-04-28 DIAGNOSIS — I10 Essential (primary) hypertension: Secondary | ICD-10-CM | POA: Diagnosis not present

## 2016-04-28 DIAGNOSIS — R059 Cough, unspecified: Secondary | ICD-10-CM

## 2016-04-28 DIAGNOSIS — I272 Other secondary pulmonary hypertension: Secondary | ICD-10-CM

## 2016-04-28 DIAGNOSIS — K766 Portal hypertension: Secondary | ICD-10-CM

## 2016-04-28 DIAGNOSIS — R0981 Nasal congestion: Secondary | ICD-10-CM

## 2016-04-28 DIAGNOSIS — I5030 Unspecified diastolic (congestive) heart failure: Secondary | ICD-10-CM | POA: Diagnosis not present

## 2016-04-28 LAB — COMPREHENSIVE METABOLIC PANEL
ALBUMIN: 4.1 g/dL (ref 3.5–5.2)
ALT: 17 U/L (ref 0–53)
AST: 35 U/L (ref 0–37)
Alkaline Phosphatase: 147 U/L — ABNORMAL HIGH (ref 39–117)
BILIRUBIN TOTAL: 1.1 mg/dL (ref 0.2–1.2)
BUN: 20 mg/dL (ref 6–23)
CALCIUM: 9.1 mg/dL (ref 8.4–10.5)
CHLORIDE: 108 meq/L (ref 96–112)
CO2: 24 meq/L (ref 19–32)
CREATININE: 1.39 mg/dL (ref 0.40–1.50)
GFR: 65.7 mL/min (ref >60.00)
Glucose, Bld: 109 mg/dL — ABNORMAL HIGH (ref 70–99)
Potassium: 4.3 mEq/L (ref 3.5–5.1)
Sodium: 138 mEq/L (ref 135–145)
Total Protein: 7.5 g/dL (ref 6.0–8.3)

## 2016-04-28 LAB — CBC WITH DIFFERENTIAL/PLATELET
BASOS PCT: 0 % (ref 0.0–3.0)
Basophils Absolute: 0 10*3/uL (ref 0.0–0.1)
EOS PCT: 0.6 % (ref 0.0–5.0)
Eosinophils Absolute: 0 10*3/uL (ref 0.0–0.7)
HEMATOCRIT: 38.5 % — AB (ref 39.0–52.0)
HEMOGLOBIN: 12.9 g/dL — AB (ref 13.0–17.0)
LYMPHS PCT: 16.5 % (ref 12.0–46.0)
Lymphs Abs: 0.7 10*3/uL (ref 0.7–4.0)
MCHC: 33.5 g/dL (ref 30.0–36.0)
MCV: 104.9 fl — AB (ref 78.0–100.0)
MONOS PCT: 8.2 % (ref 3.0–12.0)
Monocytes Absolute: 0.3 10*3/uL (ref 0.1–1.0)
NEUTROS ABS: 3 10*3/uL (ref 1.4–7.7)
Neutrophils Relative %: 74.7 % (ref 43.0–77.0)
RBC: 3.67 Mil/uL — ABNORMAL LOW (ref 4.22–5.81)
RDW: 16.5 % — AB (ref 11.5–15.5)
WBC: 4 10*3/uL (ref 4.0–10.5)

## 2016-04-28 LAB — BRAIN NATRIURETIC PEPTIDE: PRO B NATRI PEPTIDE: 383 pg/mL — AB (ref 0.0–100.0)

## 2016-04-28 MED ORDER — BENZONATATE 100 MG PO CAPS: 100.0000 mg | ORAL_CAPSULE | Freq: Three times a day (TID) | ORAL | Status: DC | PRN

## 2016-04-28 MED ORDER — FLUTICASONE PROPIONATE 50 MCG/ACT NA SUSP: 2.0000 | Freq: Every day | NASAL | Status: DC

## 2016-04-28 MED FILL — FLUTICASONE PROP 50 MCG SPR: 50 | 30 days supply | Qty: 16 | Fill #0

## 2016-04-28 MED FILL — BENZONATATE 100 MG CAPSULE: 100 | 7 days supply | Qty: 21 | Fill #0

## 2016-04-28 NOTE — Telephone Encounter (Signed)
rx sent downstairs. Will you cancel the express script rx today

## 2016-04-28 NOTE — Telephone Encounter (Signed)
Will cancel.

## 2016-04-28 NOTE — Telephone Encounter (Signed)
Please see referal to Dr. Alen Blew.

## 2016-04-28 NOTE — Telephone Encounter (Signed)
[facility] called critical results for low plt count of 44....KMP

## 2016-04-28 NOTE — Telephone Encounter (Addendum)
Sent lab result (Platelet =44) to [facility] via Skype. States patient has had this level in the past. Will address.

## 2016-04-28 NOTE — Patient Instructions (Addendum)
For your recent hospitalization, will do repeat labs cbc, cmp, bnp and chest xray.  Will see if bnp elevated and see if any indication of chf flare.  Continue current meds and will continue to hold losartan. Will let cardiologist restart if he thinks necessary on Friday.  We will call you on your lab resuts and chest xray.  For your cough and nasal congestion will rx flonase and benzonatate. If cough is more productive, sinus pressure/pain or fever let me know and will rx antibiotic.  Follow up with your various specialist or here as needed

## 2016-04-28 NOTE — Progress Notes (Signed)
Subjective:    Patient ID: Darren Mueller, male    DOB: Sep 23, 1951, 65 y.o.   MRN: [id number]  HPI  Pt in for follow up from hospital.   Systolic CHF, acute on chronic (HCC) . Hypertension . Hypokalemia . GERD (gastroesophageal reflux disease) . Secondary pulmonary hypertension (HCC) . Hypothyroidism  Above are dc diagnosis.  Pt was to follow up and get repeat cbc and cmp.  Pt hospital course showed  Chest pain, Systolic CHF, acute on chronic (HCC) With underlying history of pulmonary hypertension:  - Chest pain resolved, troponins 3 negative, BNP 543.9 - D-dimer negative - 2-D echo in February 2017 showed normal EF, grade 1 diastolic dysfunction, PA pressure over 80  - Patient underwent left and right heart cath Which showed minimal CAD, moderate pulmonary hypertension, improved from previous, normal left-sided pressures after diuresis, recommended medical therapy.   Pulmonary Hypertension in the setting of cirrhosis, OSA, COPD - On macitentan, adcirca, and selexipag per cardiology   Hypertension - Stable on current regimen, Lasix, Aldactone - Lasix dose changed to 40 mg daily outpatient per CHF team recommendations  Hypokalemia - Likely due to diuresis, replaced, patient placed on potassium 20 mg daily outpatient  OSA - Intolerant of CPAP, continue O2 via nasal cannula  Pancytopenia, liver cirrhosis, thrombocytopenia - Has followed hematology, Dr. Clelia Croft  Pt has follow up with his cardiologist.   GERD (gastroesophageal reflux disease) - Continue PPI   Hypothyroidism - Continue Synthroid, check TSH  History of liver cirrhosis  -Following GI, Dr. Christella Hartigan, likely due to alcohol abuse and RV failure  - Currently stable, no evidence of hepatic encephalopathy  -On Lasix and Aldactone  Day of Discharge BP 130/78 mmHg  Pulse 82  Temp(Src) 98.6 F (37 C) (Oral)  Resp 16  Ht 6' (1.829 m)  Wt 86.229 kg (190 lb 1.6 oz)  BMI 25.78 kg/m2  SpO2  98%   His weight today on our office scale shows 195 lb.    Today, dyspnea is about base line(not worse since dc). No chest pain, no  popliteal pain, no  orthopnea and rare cough.(But on day of discharge got stuffy nose and some pnd). No fever.   Past Medical History  Diagnosis Date  . Hypertension   . Seizures (HCC)   . Hx of colonic polyps   . GERD (gastroesophageal reflux disease)   . Gout   . Alcoholic cirrhosis of liver with ascites (HCC) 2014  . Unspecified hypothyroidism 01/25/2013  . Unspecified pleural effusion   . Other chronic pulmonary heart diseases   . Hypopotassemia   . Renal insufficiency 07/18/2013  . Diarrhea 09/04/2013  . Otalgia 11/28/2013  . Dermatitis 06/10/2015  . Alcohol dependence in remission (HCC) 03/22/2011    In remission   . Alcohol dependence (HCC) 03/22/2011    In remission   . Red eye 03/03/2016    right  . Sleep apnea     does not wear CPAP  . Heart murmur      Social History   Social History  . Marital Status: Divorced    Spouse Name: N/A  . Number of Children: 3  . Years of Education: N/A   Occupational History  . Retired     Company secretary   Social History Main Topics  . Smoking status: Former Smoker -- 2.00 packs/day for 5 years    Types: Cigarettes    Quit date: 09/22/1979  . Smokeless tobacco: Never Used  . Alcohol Use: 0.0 oz/week  0 Standard drinks or equivalent per week     Comment: 2 vodka tonic mon, wed, friday  . Drug Use: No  . Sexual Activity: Not on file   Other Topics Concern  . Not on file   Social History Narrative   0 caffeine drinks daily     Past Surgical History  Procedure Laterality Date  . Colonoscopy N/A 12/08/2013    Procedure: COLONOSCOPY;  Surgeon: Rachael Fee, MD;  Location: [facility] ENDOSCOPY;  Service: Endoscopy;  Laterality: N/A;  . Right heart catheterization Right 06/06/2014    Procedure: RIGHT HEART CATH;  Surgeon: Dolores Patty, MD;  Location[facility] CATH LAB;  Service: Cardiovascular;   Laterality: Right;  . Uvulopalatopharyngoplasty  1999  . Cardiac catheterization N/A 12/21/2015    Procedure: Right Heart Cath;  Surgeon: Dolores Patty, MD;  Location: [facility] INVASIVE CV LAB;  Service: Cardiovascular;  Laterality: N/A;  . Esophagogastroduodenoscopy (egd) with propofol N/A 03/27/2016    Procedure: ESOPHAGOGASTRODUODENOSCOPY (EGD) WITH PROPOFOL;  Surgeon: Rachael Fee, MD;  Location: [facility] ENDOSCOPY;  Service: Endoscopy;  Laterality: N/A;  . Cardiac catheterization N/A 04/15/2016    Procedure: Right/Left Heart Cath and Coronary Angiography;  Surgeon: Dolores Patty, MD;  Location: [facility] INVASIVE CV LAB;  Service: Cardiovascular;  Laterality: N/A;    Family History  Problem Relation Age of Onset  . Heart disease Mother   . Heart attack Mother   . Hypertension Mother   . Prostate cancer Father   . Colon cancer Neg Hx     Allergies  Allergen Reactions  . Aspirin Other (See Comments)    hypertension    Current Outpatient Prescriptions on File Prior to Visit  Medication Sig Dispense Refill  . albuterol (PROVENTIL HFA;VENTOLIN HFA) 108 (90 BASE) MCG/ACT inhaler Inhale 2 puffs into the lungs every 6 (six) hours as needed for wheezing or shortness of breath.    . doxepin (SINEQUAN) 10 MG capsule TAKE 1 TO 2 CAPSULES AT BEDTIME 180 capsule 1  . Fluticasone Propionate 0.05 % LOTN Apply daily to affected area (Patient taking differently: Apply 1 application topically daily. Apply daily to affected area) 3 Bottle 3  . furosemide (LASIX) 40 MG tablet Take 1 tablet (40 mg total) by mouth daily. 30 tablet 3  . levothyroxine (SYNTHROID, LEVOTHROID) 25 MCG tablet Take 1 tablet (25 mcg total) by mouth daily. 90 tablet 1  . loperamide (ANTI-DIARRHEAL) 2 MG capsule Take 4 mg by mouth daily.     Marland Kitchen losartan (COZAAR) 50 MG tablet Take 1 tablet (50 mg total) by mouth daily. HOLD until your follow-up appointment (Patient not taking: Reported on 04/17/2016) 90 tablet 2  . Macitentan (OPSUMIT) 10  MG TABS Take 10 mg by mouth daily. 90 tablet 3  . mometasone-formoterol (DULERA) 100-5 MCG/ACT AERO Inhale 2 puffs into the lungs daily.     Marland Kitchen omeprazole (PRILOSEC) 40 MG capsule Take 1 capsule (40 mg total) by mouth daily. 90 capsule 2  . potassium chloride 20 MEQ TBCR Take 20 mEq by mouth daily. 30 tablet 3  . Selexipag (UPTRAVI) 1000 MCG TABS Take 1,000 mg by mouth every morning. (Patient taking differently: Take 1,000 mg by mouth at bedtime. ) 90 tablet 2  . Selexipag (UPTRAVI) 800 MCG TABS Take 1 tablet (800 mcg total) by mouth every evening. (Patient taking differently: Take 800 mcg by mouth daily. ) 90 tablet 2  . spironolactone (ALDACTONE) 50 MG tablet Take 1 tablet (50 mg total) by mouth daily.  90 tablet 1  . SYMAX-SR 0.375 MG 12 hr tablet TAKE 1 TABLET TWICE A DAY 180 tablet 1  . Tadalafil, PAH, (ADCIRCA) 20 MG TABS TAKE 2 TABLETS (40 MG TOTAL) DAILY 180 tablet 2   No current facility-administered medications on file prior to visit.    BP 120/74 mmHg  Pulse 89  Temp(Src) 98.1 F (36.7 C) (Oral)  Ht 6' (1.829 m)  Wt 195 lb 3.2 oz (88.542 kg)  BMI 26.47 kg/m2  SpO2 100%       Review of Systems  Constitutional: Negative for fever, chills and fatigue.  Respiratory: Positive for shortness of breath. Negative for cough and wheezing.        Same as baseline.  Cardiovascular: Negative for chest pain and palpitations.  Gastrointestinal: Negative for abdominal pain.  Musculoskeletal: Negative for back pain.  Neurological: Negative for dizziness, weakness and light-headedness.  Hematological: Negative for adenopathy. Does not bruise/bleed easily.  Psychiatric/Behavioral: Negative for behavioral problems, dysphoric mood and decreased concentration.    Past Medical History  Diagnosis Date  . Hypertension   . Seizures (HCC)   . Hx of colonic polyps   . GERD (gastroesophageal reflux disease)   . Gout   . Alcoholic cirrhosis of liver with ascites (HCC) 2014  . Unspecified  hypothyroidism 01/25/2013  . Unspecified pleural effusion   . Other chronic pulmonary heart diseases   . Hypopotassemia   . Renal insufficiency 07/18/2013  . Diarrhea 09/04/2013  . Otalgia 11/28/2013  . Dermatitis 06/10/2015  . Alcohol dependence in remission (HCC) 03/22/2011    In remission   . Alcohol dependence (HCC) 03/22/2011    In remission   . Red eye 03/03/2016    right  . Sleep apnea     does not wear CPAP  . Heart murmur      Social History   Social History  . Marital Status: Divorced    Spouse Name: N/A  . Number of Children: 3  . Years of Education: N/A   Occupational History  . Retired     Company secretary   Social History Main Topics  . Smoking status: Former Smoker -- 2.00 packs/day for 5 years    Types: Cigarettes    Quit date: 09/22/1979  . Smokeless tobacco: Never Used  . Alcohol Use: 0.0 oz/week    0 Standard drinks or equivalent per week     Comment: 2 vodka tonic mon, wed, friday  . Drug Use: No  . Sexual Activity: Not on file   Other Topics Concern  . Not on file   Social History Narrative   0 caffeine drinks daily     Past Surgical History  Procedure Laterality Date  . Colonoscopy N/A 12/08/2013    Procedure: COLONOSCOPY;  Surgeon: Rachael Fee, MD;  Location: [facility] ENDOSCOPY;  Service: Endoscopy;  Laterality: N/A;  . Right heart catheterization Right 06/06/2014    Procedure: RIGHT HEART CATH;  Surgeon: Dolores Patty, MD;  Location: [facility] CATH LAB;  Service: Cardiovascular;  Laterality: Right;  . Uvulopalatopharyngoplasty  1999  . Cardiac catheterization N/A 12/21/2015    Procedure: Right Heart Cath;  Surgeon: Dolores Patty, MD;  Location: [facility] INVASIVE CV LAB;  Service: Cardiovascular;  Laterality: N/A;  . Esophagogastroduodenoscopy (egd) with propofol N/A 03/27/2016    Procedure: ESOPHAGOGASTRODUODENOSCOPY (EGD) WITH PROPOFOL;  Surgeon: Rachael Fee, MD;  Location: [facility] ENDOSCOPY;  Service: Endoscopy;  Laterality: N/A;  . Cardiac catheterization N/A  04/15/2016    Procedure: Right/Left  Heart Cath and Coronary Angiography;  Surgeon: Dolores Patty, MD;  Location: [facility] INVASIVE CV LAB;  Service: Cardiovascular;  Laterality: N/A;    Family History  Problem Relation Age of Onset  . Heart disease Mother   . Heart attack Mother   . Hypertension Mother   . Prostate cancer Father   . Colon cancer Neg Hx     Allergies  Allergen Reactions  . Aspirin Other (See Comments)    hypertension    Current Outpatient Prescriptions on File Prior to Visit  Medication Sig Dispense Refill  . albuterol (PROVENTIL HFA;VENTOLIN HFA) 108 (90 BASE) MCG/ACT inhaler Inhale 2 puffs into the lungs every 6 (six) hours as needed for wheezing or shortness of breath.    . doxepin (SINEQUAN) 10 MG capsule TAKE 1 TO 2 CAPSULES AT BEDTIME 180 capsule 1  . Fluticasone Propionate 0.05 % LOTN Apply daily to affected area (Patient taking differently: Apply 1 application topically daily. Apply daily to affected area) 3 Bottle 3  . furosemide (LASIX) 40 MG tablet Take 1 tablet (40 mg total) by mouth daily. 30 tablet 3  . loperamide (ANTI-DIARRHEAL) 2 MG capsule Take 4 mg by mouth daily.     Marland Kitchen losartan (COZAAR) 50 MG tablet Take 1 tablet (50 mg total) by mouth daily. HOLD until your follow-up appointment 90 tablet 2  . Macitentan (OPSUMIT) 10 MG TABS Take 10 mg by mouth daily. 90 tablet 3  . mometasone-formoterol (DULERA) 100-5 MCG/ACT AERO Inhale 2 puffs into the lungs daily.     . potassium chloride 20 MEQ TBCR Take 20 mEq by mouth daily. 30 tablet 3  . Selexipag (UPTRAVI) 1000 MCG TABS Take 1,000 mg by mouth every morning. (Patient taking differently: Take 1,000 mg by mouth at bedtime. ) 90 tablet 2  . Selexipag (UPTRAVI) 800 MCG TABS Take 1 tablet (800 mcg total) by mouth every evening. (Patient taking differently: Take 800 mcg by mouth daily. ) 90 tablet 2  . spironolactone (ALDACTONE) 50 MG tablet Take 1 tablet (50 mg total) by mouth daily. 90 tablet 1  . SYMAX-SR  0.375 MG 12 hr tablet TAKE 1 TABLET TWICE A DAY 180 tablet 1  . Tadalafil, PAH, (ADCIRCA) 20 MG TABS TAKE 2 TABLETS (40 MG TOTAL) DAILY 180 tablet 2  . levothyroxine (SYNTHROID, LEVOTHROID) 25 MCG tablet Take 1 tablet (25 mcg total) by mouth daily. 90 tablet 1  . omeprazole (PRILOSEC) 40 MG capsule Take 1 capsule (40 mg total) by mouth daily. 90 capsule 2   No current facility-administered medications on file prior to visit.    BP 120/74 mmHg  Pulse 89  Temp(Src) 98.1 F (36.7 C) (Oral)  Ht 6' (1.829 m)  Wt 195 lb 3.2 oz (88.542 kg)  BMI 26.47 kg/m2  SpO2 100%       Objective:   Physical Exam  General Mental Status- Alert. General Appearance- Not in acute distress.      HEENT Head- Normal. Ear Auditory Canal - Left- Normal. Right - Normal.Tympanic Membrane- Left- Normal. Right- Normal. Eye Sclera/Conjunctiva- Left- Normal. Right- Normal. Nose & Sinuses Nasal Mucosa- Left-  Boggy and Congested. Right-  Boggy and  Congested.Bilateral maxillary and frontal sinus pressure. Mouth & Throat Lips: Upper Lip- Normal: no dryness, cracking, pallor, cyanosis, or vesicular eruption. Lower Lip-Normal: no dryness, cracking, pallor, cyanosis or vesicular eruption. Buccal Mucosa- Bilateral- No Aphthous ulcers. Oropharynx- No Discharge or Erythema. Tonsils: Characteristics- Bilateral- No Erythema or Congestion. Size/Enlargement- Bilateral- No enlargement. Discharge- bilateral-None.  Neck Carotid Arteries- Normal color. Moisture- Normal Moisture. No carotid bruits. No JVD.  Chest and Lung Exam Auscultation: Breath Sounds:-Normal.(lying supine O2 sat 96%)  Cardiovascular Auscultation:Rythm- Regular. Murmurs & Other Heart Sounds:Auscultation of the heart reveals- No Murmurs.  Abdomen Inspection:-Inspeection Normal. Palpation/Percussion:Note:No mass. Palpation and Percussion of the abdomen reveal- Non Tender, Non Distended + BS, no rebound or guarding.    Neurologic Cranial  Nerve exam:- CN III-XII intact(No nystagmus), symmetric smile. Strength:- 5/5 equal and symmetric strength both upper and lower extremities.  Lower ext- no popliteal pain. Negative homans signs.      Assessment & Plan:  For your recent hospitalization, will do repeat labs cbc, cmp, bnp and chest xray.  Will see if bnp elevated and see if any indication of chf flare.  Continue current meds and will continue to hold losartan. Will let cardiologist restart if he thinks necessary on Friday.  We will call you on your lab resuts and chest xray.  For your cough and nasal congestion will rx flonase and benzonatate. If cough is more productive, sinus pressure/pain or fever let me know and will rx antibiotic.  Follow up with your various specialist or here as needed    Esdras Delair, Ramon Dredge, [place]

## 2016-04-28 NOTE — Progress Notes (Signed)
Pre visit review using our clinic tool,if applicable. No additional management support is needed unless otherwise documented below in the visit note.

## 2016-04-29 NOTE — Telephone Encounter (Signed)
Patient is scheduled for 06/25/16

## 2016-04-29 NOTE — Telephone Encounter (Signed)
Will you give me update on referral to Dr. Alen Blew.

## 2016-04-30 ENCOUNTER — Telehealth: Payer: Self-pay | Admitting: Medical

## 2016-04-30 NOTE — Telephone Encounter (Signed)
Pt appointment to hematologist is little far off. With his low platelets I want him to watch for any nose bleeds, bright red blood in stools, black stools or diffuse bleeding. If this occurs then ED evaluation. If every thing stable then repeat cbc in 3 wks. Make sure platelets not decreasing further. Will you put in future cbc order. Keep Dr. Alen Blew appointment.

## 2016-05-01 ENCOUNTER — Ambulatory Visit (HOSPITAL_COMMUNITY)
Admission: RE | Admit: 2016-05-01 | Discharge: 2016-05-01 | Disposition: A | Discharge status: Home / Self Care | Payer: Medicare Other | Source: Ambulatory Visit | Attending: Internal Medicine | Admitting: Internal Medicine

## 2016-05-01 ENCOUNTER — Encounter (HOSPITAL_COMMUNITY): Payer: Self-pay | Admitting: Internal Medicine

## 2016-05-01 VITALS — BP 112/66 | HR 82 | Wt 195.0 lb

## 2016-05-01 DIAGNOSIS — K7031 Alcoholic cirrhosis of liver with ascites: Secondary | ICD-10-CM

## 2016-05-01 DIAGNOSIS — D61818 Other pancytopenia: Secondary | ICD-10-CM | POA: Diagnosis not present

## 2016-05-01 DIAGNOSIS — I272 Other secondary pulmonary hypertension: Secondary | ICD-10-CM

## 2016-05-01 DIAGNOSIS — J029 Acute pharyngitis, unspecified: Secondary | ICD-10-CM | POA: Insufficient documentation

## 2016-05-01 DIAGNOSIS — I2721 Secondary pulmonary arterial hypertension: Secondary | ICD-10-CM

## 2016-05-01 DIAGNOSIS — K7030 Alcoholic cirrhosis of liver without ascites: Secondary | ICD-10-CM | POA: Diagnosis not present

## 2016-05-01 DIAGNOSIS — I129 Hypertensive chronic kidney disease with stage 1 through stage 4 chronic kidney disease, or unspecified chronic kidney disease: Secondary | ICD-10-CM | POA: Insufficient documentation

## 2016-05-01 DIAGNOSIS — R161 Splenomegaly, not elsewhere classified: Secondary | ICD-10-CM | POA: Insufficient documentation

## 2016-05-01 DIAGNOSIS — J440 Chronic obstructive pulmonary disease with acute lower respiratory infection: Secondary | ICD-10-CM | POA: Diagnosis not present

## 2016-05-01 DIAGNOSIS — N189 Chronic kidney disease, unspecified: Secondary | ICD-10-CM | POA: Insufficient documentation

## 2016-05-01 DIAGNOSIS — G4733 Obstructive sleep apnea (adult) (pediatric): Secondary | ICD-10-CM | POA: Diagnosis not present

## 2016-05-01 DIAGNOSIS — J209 Acute bronchitis, unspecified: Secondary | ICD-10-CM | POA: Diagnosis not present

## 2016-05-01 MED ORDER — DOXYCYCLINE HYCLATE 100 MG PO TBEC: 100.0000 mg | DELAYED_RELEASE_TABLET | Freq: Two times a day (BID) | ORAL | Status: DC

## 2016-05-01 MED FILL — DOXYCYCLINE 100 MG TABLET: 100 | 7 days supply | Qty: 14 | Fill #0

## 2016-05-01 NOTE — Patient Instructions (Signed)
Take doxycycline 100 mg tablet twice daily for 1 week.  Follow up 3 months with Dr. Haroldine Laws.  Do the following things EVERYDAY: 1) Weigh yourself in the morning before breakfast. Write it down and keep it in a log. 2) Take your medicines as prescribed 3) Eat low salt foods-Limit salt (sodium) to 2000 mg per day.  4) Stay as active as you can everyday 5) Limit all fluids for the day to less than 2 liters

## 2016-05-01 NOTE — Addendum Note (Signed)
Encounter addended by: Effie Berkshire, RN on: 05/01/2016 12:12 PM<BR>     Documentation filed: Patient Instructions Section, Orders

## 2016-05-01 NOTE — Progress Notes (Signed)
ADVANCED HF CLINIC NOTE  Patient ID: Darren Mueller, male   DOB: Jul 30, 1951, 65 y.o.   MRN: [id number] GI : Dr Christella Hartigan PCP: Dr Abner Greenspan  Subjective:    Darren Mueller ("Darren Mueller") is a 65 yo with history of COPD, portopulmonary hypertension with RV failure, and ETOH cirrhosis presents for followup of his PAH.  He returns today for regular PAH follow up. Since we last saw him underwent RHC. Pulmonary pressures higher since previous but cardiac improved. However PVR still very high and RAP 11. Had CT of chest. Only notable for cirrhosis and splenomegaly. Saw Dr. Christella Hartigan in GI who ordered hepatoma screening, liver u/s and EGD to screen for varices  Admitted in 6/17 with CP and SOB. Was diuresed. Troponins normal. Underwent R/L cath. Minimal CAD with moderate PAH and preserved cardiac output.   Returns for post-hospital. Says he feels terrible. Has lots of post-nasal drip. + cough. Greenish sputum and sore throat. Going on for over a week. Saw PCP and started [employer]. CXR ok. No sinus pain. No fever or chills. Continues with selexipag at 800 qam/ 1000 qpm. Unable to tolerate any further uptitration with HAs and worsening flushing.  Remains on Macitentan and Adcirca. Hasn't been back to [employer] yet.  Hasn't had swelling or needed extra lasix. Weight stable 195.  No lightheadedness or dizziness. Denies presyncope/syncope.     PAH meds 1) Macitnentan 10 2) Adcirca 10 3) Selexapeg  Studies:  ECHO 12/14 EF 55-60% Peak PA pressure 35. Severe RV dysfunction  ECHO 7/15 EF 60% RV moderately to severely dilated. Moderate HK RVSP 57mm HG  ECHO 6/16 EF 60% RV moderately to severely dilated. Severe HK RVSP ~65 mm HG. D-shaped septum Echo 2/17 LVEF 60-65% RV massively dilated. Flat septum. Severe HK. Moderate TR RVSP ~65. IVC small. No effusion   Cath 6/17  Mid RCA lesion, 20% stenosed. Dist LAD lesion, 20% stenosed.  Ao = 107/70 (87) LV = 106/3/11 RA = 4 RV = 74/11 PA = 74/26 (45) PCW =  6 Fick cardiac output/index = 4.6.2.2 PVR = 8.5 Ao sat = 95% PA sat = 61%, 58%  RHC 2/17 RA = 11 RV = 80/16/19 PA = 83/61 (71) PCW = 9 Fick cardiac output/index = 5.6/2.6 PVR = 10.3 WU Ao sat = 94% PA sat = 65%, 68% High SVC sat = 65%  RHC 2/14: RA = 13  RV = 63/5/14  PA = 65/24 (41)  PCW = 6  Hepatic wedge = 21  Fick cardiac output/index = 3.8/1.8  PVR = 9.1  FA sat = 94%  PA sat = 58%, 56%  SVC = 54%  RHC 06/06/14 RA = 5  RV = 73/5/9  PA = 81/25 (46)  PCW = 7  Fick cardiac output/index = 6.0/2.8  PVR = 6.5 Woods  FA sat = 95%  PA sat = 71%, 67%  Unable to get hepatic wedge  PFTs 3/14 FEV1 2.02 L (58%) FVC 2.7L (54%) FEV1/FVC (89%) DLCO 53%  PFTs 1/17 FEV1 2.05 L (61%) FVC 2.48L (56%) DLCO 44%   Event Monitor - NSR 10/26/13.   Ab u/s 8/16: + cirrhosis/mild to moderate splenomegaly  6 min walk 01/10/13, 1290 feet  (01/10/14) = 1280 feet (380 m) (9/15) = 1350 feet (411 m) O2 sats ranged from 89-95% on room air, HR ranged from 100-129. (6/16) = 384 meters  (2/17) = 1250 feet (341m)  Labs:  10/19/13 Creatinine 1.79 K 4.1  3/15        Cr 1.78 K 4.2 03/17/14    CR 1.23 K 4.0 LFTs ok 06/06/14 K 4.0 Creatinine 1.4  08/08/14 K 4.0 Cr 1.6 09/12/14 K 4.3 Cr 1.8 LFTs ok, HCT 58 12/15 K 4.3 Cr 1.15  06/2015 K 3.6, Cr 1.5 1/17  K 3.7 Cr 1.29 AST 75 ALT 33 Albumin 4.0 Bili 1.4 Wbc 2.2 hgb 13.5 PLT 61k 04/28/16  K 4.3 Cr 1.39 Hgb 12.9 PLT 44 BNP 383   Current Outpatient Prescriptions on File Prior to Encounter  Medication Sig Dispense Refill  . albuterol (PROVENTIL HFA;VENTOLIN HFA) 108 (90 BASE) MCG/ACT inhaler Inhale 2 puffs into Darren lungs every 6 (six) hours as needed for wheezing or shortness of breath.    . benzonatate (TESSALON) 100 MG capsule Take 1 capsule (100 mg total) by mouth 3 (three) times daily as needed. 21 capsule 0  . doxepin (SINEQUAN) 10 MG capsule TAKE 1 TO 2 CAPSULES AT BEDTIME 180 capsule 1  . fluticasone (FLONASE) 50  MCG/ACT nasal spray Place 2 sprays into both nostrils daily. 16 g 1  . Fluticasone Propionate 0.05 % LOTN Apply daily to affected area (Patient taking differently: Apply 1 application topically daily. Apply daily to affected area) 3 Bottle 3  . furosemide (LASIX) 40 MG tablet Take 1 tablet (40 mg total) by mouth daily. 30 tablet 3  . levothyroxine (SYNTHROID, LEVOTHROID) 25 MCG tablet Take 1 tablet (25 mcg total) by mouth daily. 90 tablet 1  . loperamide (ANTI-DIARRHEAL) 2 MG capsule Take 4 mg by mouth daily.     . Macitentan (OPSUMIT) 10 MG TABS Take 10 mg by mouth daily. 90 tablet 3  . mometasone-formoterol (DULERA) 100-5 MCG/ACT AERO Inhale 2 puffs into Darren lungs daily.     Marland Kitchen omeprazole (PRILOSEC) 40 MG capsule Take 1 capsule (40 mg total) by mouth daily. 90 capsule 2  . potassium chloride 20 MEQ TBCR Take 20 mEq by mouth daily. 30 tablet 3  . Selexipag (UPTRAVI) 1000 MCG TABS Take 1,000 mg by mouth every morning. 90 tablet 2  . Selexipag (UPTRAVI) 800 MCG TABS Take 1 tablet (800 mcg total) by mouth every evening. 90 tablet 2  . spironolactone (ALDACTONE) 50 MG tablet Take 1 tablet (50 mg total) by mouth daily. 90 tablet 1  . SYMAX-SR 0.375 MG 12 hr tablet TAKE 1 TABLET TWICE A DAY 180 tablet 1  . Tadalafil, PAH, (ADCIRCA) 20 MG TABS TAKE 2 TABLETS (40 MG TOTAL) DAILY 180 tablet 2   No current facility-administered medications on file prior to encounter.    Objective:   Weight Range:  Vital Signs:   Pulse Rate:  [82] 82 (06/29 1114) BP: (112)/(66) 112/66 mmHg (06/29 1114) SpO2:  [98 %] 98 % (06/29 1114) Weight:  [195 lb (88.451 kg)] 195 lb (88.451 kg) (06/29 1114)    Wt Readings from Last 3 Encounters:  05/01/16 195 lb (88.451 kg)  04/28/16 195 lb 3.2 oz (88.542 kg)  04/16/16 190 lb 1.6 oz (86.229 kg)     Physical Exam: General: NAD, facial flushing.  Neck: JVP ~9  cm  no thyromegaly or thyroid nodule.  Lungs: CTAB, normal effort. CV: Nondisplaced PMI. Heart regular  2/6 TR  P2 LLSB. No carotid bruit.   Abdomen: Soft, NT, ND, no HSM. No bruits or masses. +BS  Ext: warm, No clubbing, trace edema.Normal pedal pulses. Skin: Intact without lesions or rashes.  Neurologic: Alert and oriented x 3.  Psych: Normal  affect.  Extremities: No clubbing or cyanosis.  HEENT: Normal. Except for erythematous rash/flushing   Assessment/Plan    1. Acute bronchitis - Will treat with doxycyline 100 bid x 7 days 2. PAH: Suspected portopulmonary HTN. - Remains on triple therapy. Recent RHC with slightly improved hemodynamics  - Persistent NYHA III symptoms. stable - Continue macitentan 10 daily, adcirca 40 mg daily, selexipag 1000 qam/800 qpm unable to titrate further - Will continue current therapy. Not candidate for IV therapies at this point but will closely for decompensation.  - Long talk about absolute need to stop ETOH completely.  - Volume status ok 3. CKD:  - Stable onr ecent labs 4. Cirrhosis:  - Likely combination of RV failure an ETOH - As above, again recommended complete cessation of ETOH.  - Saw Dr. Christella Hartigan and further testing pending. - Albumin 4.0 but AST remains elevated and platelets low. - No evidence of hepatic encephalopathy5 5. HTN:  - Stable on current regimen.  6. OSA:  -Has mild OSA with AHI 12 and desats down to 82%.  - Intolerant CPAP.  - Have encouraged to use a pulse ox and use 02 prn to keep sats > 90% 7. Pancytopenia:  - Progressive. Most likely due to cirrhosis. Has seen Dr. Myna Hidalgo in Hematology as well as Dr. Clelia Croft. Will follow with q6 month CBC.     Wilferd Ritson,MD 11:48 AM

## 2016-05-05 ENCOUNTER — Other Ambulatory Visit (HOSPITAL_COMMUNITY): Payer: Self-pay | Admitting: Adult Health

## 2016-05-21 NOTE — Addendum Note (Signed)
Addendum  created 05/21/16 0945 by Finis Bud, MD   Modules edited: Clinical Notes   Clinical Notes:  File: [id number]

## 2016-06-01 ENCOUNTER — Other Ambulatory Visit: Payer: Self-pay | Admitting: Family Medicine

## 2016-06-03 ENCOUNTER — Other Ambulatory Visit (INDEPENDENT_AMBULATORY_CARE_PROVIDER_SITE_OTHER): Payer: Medicare Other

## 2016-06-03 ENCOUNTER — Encounter: Payer: Self-pay | Admitting: Gastroenterology

## 2016-06-03 ENCOUNTER — Ambulatory Visit (INDEPENDENT_AMBULATORY_CARE_PROVIDER_SITE_OTHER): Payer: Medicare Other | Admitting: Gastroenterology

## 2016-06-03 VITALS — BP 152/80 | HR 80 | Ht 72.0 in | Wt 196.8 lb

## 2016-06-03 DIAGNOSIS — K7030 Alcoholic cirrhosis of liver without ascites: Secondary | ICD-10-CM

## 2016-06-03 DIAGNOSIS — R197 Diarrhea, unspecified: Secondary | ICD-10-CM | POA: Diagnosis not present

## 2016-06-03 LAB — PROTIME-INR
INR: 1.2 ratio — AB (ref 0.8–1.0)
PROTHROMBIN TIME: 12.5 s (ref 9.6–13.1)

## 2016-06-03 NOTE — Patient Instructions (Addendum)
You will be set up for an ultrasound for hepatoma screening.    You have been scheduled for an abdominal ultrasound at [facility] (1st floor of hospital) on 06/06/16 at 930 am. Please arrive 15 minutes prior to your appointment for registration. Make certain not to have anything to eat or drink 6 hours prior to your appointment. Should you need to reschedule your appointment, please contact radiology at [phone]. This test typically takes about 30 minutes to perform.  You will have labs checked today in the basement lab.  Please head down after you check out with the front desk  (INR, AFP).  Follow up with PCP about cough, bronchitis.  Try adding one imodium at bedtime every night.  Please return to see Darren Mueller in 4-5 months, sooner if needed.

## 2016-06-03 NOTE — Progress Notes (Signed)
Review of pertinent gastrointestinal problems:  1. history of adenomatous colon polyps : Colonoscopy 12/10: "Two sessile polyps were found in hepatic flexure (8-66m), piecemeal resected and then tatoo'd with INigerInk" path showed "moderate to severe dysplasia" and he was recommended to have repeat examination at 3 month interval. Colonoscopy December 2012 found 4 polyps, one was piecemeal removed, adenoma. He was recommended to have repeat at 6 month recall. With pulm HTN, active, dyspnea this was changed to 04/2014. Colonsocopy 12/2013 jacobs" one small polyp, was not adenomatous. Recall colonoscopy 12/2018 2. incidentally noted esophageal varices on 2012 EGD done for dysphasia. 2012 Abdominal ultrasound showed a normal liver without clear sign of cirrhosis. Was told to cut back on drinking.  3. 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 From chronic etoh over use. Labs 12/12 Hep A Ab +, Hep B S ag and Ab negative, Hep C Ab neg; ANA, AIA, AMA, ceruloplasm, iron testing all neg;   Last liver imaging: 06/2015, + cirrhosis, no focal masses  Last EGD: 03/2016: small varices, mild portal gastropathy  Hep B immunization series started 11/2012  4. 2014 right heart angiogram suggested severe right heart failure, pulmonary hypertension started on macitentan 01/18/2013. Followed closely by CHF clinic who has assumed lead role in managing diuretics. 5. Diarrhea: Colonoscopy 12/2013; normal TI, no macroscopic colitis, no microscopic colitis on biopsies. Possibly med related.  HPI: This is a  very pleasant 65year old man whom I last saw time of an upper endoscopy about 3 months ago. See that results summarized above  Chief complaint is  Cirrhosis  We ordered an abdominal ultrasound for him however he never had it, probably due to all his other medical concerns, scheduled visits.  Down 7 pounds since last  visti 6 months ago.  Labs 04/2016: cbc showed plts 44, CMET essentially normal.  Was in hospital for CHF exerbation for a week.  Breathing easier now.  He takes imodium one pill every morning.  Taking 4 a day.  Past Medical History:  Diagnosis Date  . Alcohol dependence ([facility] 03/22/2011   In remission   . Alcohol dependence in remission ([facility] 03/22/2011   In remission   . Alcoholic cirrhosis of liver with ascites ([facility] 2014  . Dermatitis 06/10/2015  . Diarrhea 09/04/2013  . GERD (gastroesophageal reflux disease)   . Gout   . Heart murmur   . Hx of colonic polyps   . Hypertension   . Hypopotassemia   . Otalgia 11/28/2013  . Other chronic pulmonary heart diseases   . Red eye 03/03/2016   right  . Renal insufficiency 07/18/2013  . Seizures ([facility]   . Sleep apnea    does not wear CPAP  . Unspecified hypothyroidism 01/25/2013  . Unspecified pleural effusion     Past Surgical History:  Procedure Laterality Date  . CARDIAC CATHETERIZATION N/A 12/21/2015   Procedure: Right Heart Cath;  Surgeon: DJolaine Artist MD;  [facility];  Service: Cardiovascular;  Laterality: N/A;  . CARDIAC CATHETERIZATION N/A 04/15/2016   Procedure: Right/Left Heart Cath and Coronary Angiography;  Surgeon: DJolaine Artist MD;  [facility];  Service: Cardiovascular;  Laterality: N/A;  . COLONOSCOPY N/A 12/08/2013   Procedure: COLONOSCOPY;  Surgeon: DMilus Banister MD;  Location: [facility];  Service: Endoscopy;  Laterality: N/A;  . ESOPHAGOGASTRODUODENOSCOPY (EGD) WITH PROPOFOL N/A 03/27/2016   Procedure:  ESOPHAGOGASTRODUODENOSCOPY (EGD) WITH PROPOFOL;  Surgeon: Milus Banister, MD;  Location: [facility];  Service: Endoscopy;  Laterality: N/A;  . RIGHT HEART CATHETERIZATION Right 06/06/2014   Procedure: RIGHT HEART CATH;  Surgeon: Jolaine Artist, MD;  Location: [facility] LAB;  Service: Cardiovascular;  Laterality: Right;  . UVULOPALATOPHARYNGOPLASTY  1999    Current  Outpatient Prescriptions  Medication Sig Dispense Refill  . albuterol (PROVENTIL HFA;VENTOLIN HFA) 108 (90 BASE) MCG/ACT inhaler Inhale 2 puffs into the lungs every 6 (six) hours as needed for wheezing or shortness of breath.    . benzonatate (TESSALON) 100 MG capsule Take 1 capsule (100 mg total) by mouth 3 (three) times daily as needed. 21 capsule 0  . doxepin (SINEQUAN) 10 MG capsule TAKE 1 TO 2 CAPSULES AT BEDTIME 180 capsule 1  . doxycycline (DORYX) 100 MG EC tablet Take 1 tablet (100 mg total) by mouth 2 (two) times daily. 14 tablet 0  . fluticasone (FLONASE) 50 MCG/ACT nasal spray Place 2 sprays into both nostrils daily. 16 g 1  . Fluticasone Propionate 0.05 % LOTN Apply daily to affected area (Patient taking differently: Apply 1 application topically daily. Apply daily to affected area) 3 Bottle 3  . furosemide (LASIX) 40 MG tablet Take 1 tablet (40 mg total) by mouth daily. 30 tablet 3  . levothyroxine (SYNTHROID, LEVOTHROID) 25 MCG tablet Take 1 tablet (25 mcg total) by mouth daily. 90 tablet 1  . loperamide (ANTI-DIARRHEAL) 2 MG capsule Take 4 mg by mouth daily.     . Macitentan (OPSUMIT) 10 MG TABS Take 10 mg by mouth daily. 90 tablet 3  . mometasone-formoterol (DULERA) 100-5 MCG/ACT AERO Inhale 2 puffs into the lungs daily.     Marland Kitchen omeprazole (PRILOSEC) 40 MG capsule Take 1 capsule (40 mg total) by mouth daily. 90 capsule 2  . potassium chloride 20 MEQ TBCR Take 20 mEq by mouth daily. 30 tablet 3  . Selexipag (UPTRAVI) 1000 MCG TABS Take 1,000 mg by mouth every morning. 90 tablet 2  . Selexipag (UPTRAVI) 800 MCG TABS Take 1 tablet (800 mcg total) by mouth every evening. 90 tablet 2  . spironolactone (ALDACTONE) 50 MG tablet TAKE 1 TABLET DAILY 90 tablet 0  . SYMAX-SR 0.375 MG 12 hr tablet TAKE 1 TABLET TWICE A DAY 180 tablet 1  . Tadalafil, PAH, (ADCIRCA) 20 MG TABS TAKE 2 TABLETS (40 MG TOTAL) DAILY 180 tablet 2   No current facility-administered medications for this visit.      Allergies as of 06/03/2016 - Review Complete 06/03/2016  Allergen Reaction Noted  . Aspirin Other (See Comments) 03/21/2011    Family History  Problem Relation Age of Onset  . Heart disease Mother   . Heart attack Mother   . Hypertension Mother   . Prostate cancer Father   . Colon cancer Neg Hx     Social History   Social History  . Marital status: Divorced    Spouse name: N/A  . Number of children: 3  . Years of education: N/A   Occupational History  . Retired     Social research officer, government   Social History Main Topics  . Smoking status: Former Smoker    Packs/day: 2.00    Years: 5.00    Types: Cigarettes    Quit date: 09/22/1979  . Smokeless tobacco: Never Used  . Alcohol use 0.0 oz/week     Comment: 2 vodka tonic mon, wed, friday  . Drug use: No  . Sexual  activity: Not on file   Other Topics Concern  . Not on file   Social History Narrative   0 caffeine drinks daily      Physical Exam: BP (!) 152/80 (BP Location: Left Arm, Patient Position: Sitting, Cuff Size: Normal)   Pulse 80   Ht 6' (1.829 m)   Wt 196 lb 12.8 oz (89.3 kg)   BMI 26.69 kg/m  Constitutional: generally well-appearing Psychiatric: alert and oriented x3 Abdomen: soft, nontender, nondistended, no obvious ascites, no peritoneal signs, normal bowel sounds Trace lower extremity edema  Assessment and plan: 65 y.o. male with  cirrhosis, chronic diarrhea he is due for hepatoma screening and we will arrange for alpha-fetoprotein as well as abdominal ultrasound. I would like to get coags on him to assess his liver function as well. It is been many months since he's had that test. He looks to be doing very well overall. Bothered by bronchitis right now and I recommended he see his primary care physician about that. He has chronic diarrhea likely related to his pulmonary hypertension medicine lists diarrhea is a very prominent side effect. Imodium seems to help continue taking it. He'll return to see me in for  5 months and sooner if needed.   Owens Loffler, MD [facility] 06/03/2016, 9:14 AM

## 2016-06-04 ENCOUNTER — Encounter (HOSPITAL_BASED_OUTPATIENT_CLINIC_OR_DEPARTMENT_OTHER): Payer: Self-pay | Admitting: Emergency Medicine

## 2016-06-04 ENCOUNTER — Observation Stay (HOSPITAL_BASED_OUTPATIENT_CLINIC_OR_DEPARTMENT_OTHER)
Admission: EM | Admit: 2016-06-04 | Discharge: 2016-06-06 | Disposition: A | Discharge status: Home / Self Care | Payer: Medicare Other | Attending: Internal Medicine | Admitting: Internal Medicine

## 2016-06-04 ENCOUNTER — Emergency Department (HOSPITAL_BASED_OUTPATIENT_CLINIC_OR_DEPARTMENT_OTHER): Payer: Medicare Other

## 2016-06-04 ENCOUNTER — Ambulatory Visit (INDEPENDENT_AMBULATORY_CARE_PROVIDER_SITE_OTHER): Payer: Medicare Other | Admitting: Physician Assistant

## 2016-06-04 VITALS — BP 126/88 | HR 78 | Temp 97.6°F | Resp 22 | Ht 72.0 in | Wt 197.1 lb

## 2016-06-04 DIAGNOSIS — I2510 Atherosclerotic heart disease of native coronary artery without angina pectoris: Secondary | ICD-10-CM | POA: Insufficient documentation

## 2016-06-04 DIAGNOSIS — Z86010 Personal history of colonic polyps: Secondary | ICD-10-CM | POA: Insufficient documentation

## 2016-06-04 DIAGNOSIS — Z886 Allergy status to analgesic agent status: Secondary | ICD-10-CM | POA: Diagnosis not present

## 2016-06-04 DIAGNOSIS — J449 Chronic obstructive pulmonary disease, unspecified: Secondary | ICD-10-CM | POA: Insufficient documentation

## 2016-06-04 DIAGNOSIS — K766 Portal hypertension: Secondary | ICD-10-CM | POA: Diagnosis not present

## 2016-06-04 DIAGNOSIS — Z87891 Personal history of nicotine dependence: Secondary | ICD-10-CM | POA: Diagnosis not present

## 2016-06-04 DIAGNOSIS — E039 Hypothyroidism, unspecified: Secondary | ICD-10-CM | POA: Diagnosis not present

## 2016-06-04 DIAGNOSIS — I080 Rheumatic disorders of both mitral and aortic valves: Secondary | ICD-10-CM | POA: Insufficient documentation

## 2016-06-04 DIAGNOSIS — G4733 Obstructive sleep apnea (adult) (pediatric): Secondary | ICD-10-CM | POA: Diagnosis not present

## 2016-06-04 DIAGNOSIS — M109 Gout, unspecified: Secondary | ICD-10-CM | POA: Diagnosis not present

## 2016-06-04 DIAGNOSIS — N183 Chronic kidney disease, stage 3 (moderate): Secondary | ICD-10-CM | POA: Diagnosis not present

## 2016-06-04 DIAGNOSIS — G40909 Epilepsy, unspecified, not intractable, without status epilepticus: Secondary | ICD-10-CM | POA: Insufficient documentation

## 2016-06-04 DIAGNOSIS — K219 Gastro-esophageal reflux disease without esophagitis: Secondary | ICD-10-CM | POA: Diagnosis present

## 2016-06-04 DIAGNOSIS — I130 Hypertensive heart and chronic kidney disease with heart failure and stage 1 through stage 4 chronic kidney disease, or unspecified chronic kidney disease: Secondary | ICD-10-CM | POA: Insufficient documentation

## 2016-06-04 DIAGNOSIS — I2721 Secondary pulmonary arterial hypertension: Secondary | ICD-10-CM | POA: Diagnosis present

## 2016-06-04 DIAGNOSIS — I5043 Acute on chronic combined systolic (congestive) and diastolic (congestive) heart failure: Secondary | ICD-10-CM | POA: Diagnosis not present

## 2016-06-04 DIAGNOSIS — K7031 Alcoholic cirrhosis of liver with ascites: Secondary | ICD-10-CM | POA: Insufficient documentation

## 2016-06-04 DIAGNOSIS — I700 Atherosclerosis of aorta: Secondary | ICD-10-CM | POA: Diagnosis not present

## 2016-06-04 DIAGNOSIS — I272 Other secondary pulmonary hypertension: Secondary | ICD-10-CM | POA: Diagnosis not present

## 2016-06-04 DIAGNOSIS — J323 Chronic sphenoidal sinusitis: Secondary | ICD-10-CM | POA: Diagnosis not present

## 2016-06-04 DIAGNOSIS — R55 Syncope and collapse: Secondary | ICD-10-CM

## 2016-06-04 DIAGNOSIS — R011 Cardiac murmur, unspecified: Secondary | ICD-10-CM | POA: Diagnosis not present

## 2016-06-04 DIAGNOSIS — R05 Cough: Secondary | ICD-10-CM | POA: Diagnosis not present

## 2016-06-04 DIAGNOSIS — IMO0001 Reserved for inherently not codable concepts without codable children: Secondary | ICD-10-CM | POA: Diagnosis present

## 2016-06-04 DIAGNOSIS — N184 Chronic kidney disease, stage 4 (severe): Secondary | ICD-10-CM | POA: Diagnosis present

## 2016-06-04 DIAGNOSIS — R0602 Shortness of breath: Secondary | ICD-10-CM

## 2016-06-04 DIAGNOSIS — D61818 Other pancytopenia: Secondary | ICD-10-CM | POA: Insufficient documentation

## 2016-06-04 DIAGNOSIS — K7030 Alcoholic cirrhosis of liver without ascites: Secondary | ICD-10-CM | POA: Diagnosis present

## 2016-06-04 DIAGNOSIS — R51 Headache: Secondary | ICD-10-CM | POA: Insufficient documentation

## 2016-06-04 DIAGNOSIS — R079 Chest pain, unspecified: Secondary | ICD-10-CM | POA: Diagnosis not present

## 2016-06-04 DIAGNOSIS — R0789 Other chest pain: Secondary | ICD-10-CM | POA: Diagnosis present

## 2016-06-04 LAB — CBC
HCT: 35.6 % — ABNORMAL LOW (ref 39.0–52.0)
Hemoglobin: 12.5 g/dL — ABNORMAL LOW (ref 13.0–17.0)
MCH: 36.3 pg — ABNORMAL HIGH (ref 26.0–34.0)
MCHC: 35.1 g/dL (ref 30.0–36.0)
MCV: 103.5 fL — AB (ref 78.0–100.0)
PLATELETS: 51 10*3/uL — AB (ref 150–400)
RBC: 3.44 MIL/uL — ABNORMAL LOW (ref 4.22–5.81)
RDW: 14 % (ref 11.5–15.5)
WBC: 2.1 10*3/uL — AB (ref 4.0–10.5)

## 2016-06-04 LAB — BASIC METABOLIC PANEL
Anion gap: 9 (ref 5–15)
BUN: 24 mg/dL — AB (ref 6–20)
CO2: 21 mmol/L — ABNORMAL LOW (ref 22–32)
CREATININE: 1.48 mg/dL — AB (ref 0.61–1.24)
Calcium: 8.8 mg/dL — ABNORMAL LOW (ref 8.9–10.3)
Chloride: 103 mmol/L (ref 101–111)
GFR calc Af Amer: 55 mL/min — ABNORMAL LOW (ref >60)
GFR, EST NON AFRICAN AMERICAN: 48 mL/min — AB (ref >60)
Glucose, Bld: 95 mg/dL (ref 65–99)
Potassium: 3.8 mmol/L (ref 3.5–5.1)
SODIUM: 133 mmol/L — AB (ref 135–145)

## 2016-06-04 LAB — PROTIME-INR
INR: 1.25
PROTHROMBIN TIME: 15.8 s — AB (ref 11.4–15.2)

## 2016-06-04 LAB — MONONUCLEOSIS SCREEN: MONO SCREEN: NEGATIVE

## 2016-06-04 LAB — BRAIN NATRIURETIC PEPTIDE: B Natriuretic Peptide: 171.9 pg/mL — ABNORMAL HIGH (ref 0.0–100.0)

## 2016-06-04 LAB — AFP TUMOR MARKER: AFP TUMOR MARKER: 2.8 ng/mL (ref <6.1)

## 2016-06-04 LAB — TROPONIN I: Troponin I: <0.03 ng/mL (ref <0.03)

## 2016-06-04 LAB — D-DIMER, QUANTITATIVE: D-Dimer, Quant: <0.27 ug/mL-FEU (ref 0.00–0.50)

## 2016-06-04 LAB — RAPID STREP SCREEN (MED CTR MEBANE ONLY): Streptococcus, Group A Screen (Direct): NEGATIVE

## 2016-06-04 MED ORDER — VERAPAMIL HCL ER 180 MG PO TBCR
180.0000 mg | EXTENDED_RELEASE_TABLET | Freq: Every day | ORAL | Status: DC
  Filled 2016-06-04: qty 1

## 2016-06-04 MED ORDER — FOLIC ACID 1 MG PO TABS
1.0000 mg | ORAL_TABLET | Freq: Every day | ORAL | Status: DC
  Administered 2016-06-04 – 2016-06-06 (×3): 1 mg via ORAL
  Filled 2016-06-04 (×3): qty 1

## 2016-06-04 MED ORDER — SODIUM CHLORIDE 0.9% FLUSH
3.0000 mL | Freq: Two times a day (BID) | INTRAVENOUS | Status: DC
  Administered 2016-06-05 – 2016-06-06 (×2): 3 mL via INTRAVENOUS

## 2016-06-04 MED ORDER — POTASSIUM CHLORIDE CRYS ER 20 MEQ PO TBCR
20.0000 meq | EXTENDED_RELEASE_TABLET | Freq: Every day | ORAL | Status: DC
  Administered 2016-06-04 – 2016-06-06 (×3): 20 meq via ORAL
  Filled 2016-06-04 (×3): qty 1

## 2016-06-04 MED ORDER — BENZONATATE 100 MG PO CAPS: 100.0000 mg | ORAL_CAPSULE | Freq: Three times a day (TID) | ORAL | Status: DC | PRN

## 2016-06-04 MED ORDER — ADULT MULTIVITAMIN W/MINERALS CH
1.0000 | ORAL_TABLET | Freq: Every day | ORAL | Status: DC
  Administered 2016-06-04 – 2016-06-06 (×3): 1 via ORAL
  Filled 2016-06-04 (×2): qty 1

## 2016-06-04 MED ORDER — SELEXIPAG 800 MCG PO TABS
800.0000 ug | ORAL_TABLET | Freq: Every evening | ORAL | Status: DC
  Administered 2016-06-05: 800 ug via ORAL
  Filled 2016-06-04: qty 1

## 2016-06-04 MED ORDER — SODIUM CHLORIDE 0.9 % IV BOLUS (SEPSIS)
1000.0000 mL | Freq: Once | INTRAVENOUS | Status: AC
Start: 2016-06-04 — End: 2016-06-04
  Administered 2016-06-04: 1000 mL via INTRAVENOUS

## 2016-06-04 MED ORDER — VITAMIN B-1 100 MG PO TABS
100.0000 mg | ORAL_TABLET | Freq: Every day | ORAL | Status: DC
  Administered 2016-06-04 – 2016-06-06 (×3): 100 mg via ORAL
  Filled 2016-06-04 (×3): qty 1

## 2016-06-04 MED ORDER — SELEXIPAG 1000 MCG PO TABS
1000.0000 ug | ORAL_TABLET | Freq: Every morning | ORAL | Status: DC
  Administered 2016-06-06: 1000 ug via ORAL
  Filled 2016-06-04 (×2): qty 1

## 2016-06-04 MED ORDER — MACITENTAN 10 MG PO TABS
10.0000 mg | ORAL_TABLET | Freq: Every day | ORAL | Status: DC
  Administered 2016-06-05 – 2016-06-06 (×2): 10 mg via ORAL
  Filled 2016-06-04 (×3): qty 1

## 2016-06-04 MED ORDER — LORAZEPAM 2 MG/ML IJ SOLN: 1.0000 mg | Freq: Four times a day (QID) | INTRAMUSCULAR | Status: DC | PRN

## 2016-06-04 MED ORDER — HYOSCYAMINE SULFATE ER 0.375 MG PO TB12
0.3750 mg | ORAL_TABLET | Freq: Two times a day (BID) | ORAL | Status: DC
  Filled 2016-06-04: qty 1

## 2016-06-04 MED ORDER — FUROSEMIDE 40 MG PO TABS
40.0000 mg | ORAL_TABLET | Freq: Every day | ORAL | Status: DC
  Administered 2016-06-05: 40 mg via ORAL
  Filled 2016-06-04: qty 1

## 2016-06-04 MED ORDER — ACETAMINOPHEN 650 MG RE SUPP: 650.0000 mg | Freq: Four times a day (QID) | RECTAL | Status: DC | PRN

## 2016-06-04 MED ORDER — MOMETASONE FURO-FORMOTEROL FUM 100-5 MCG/ACT IN AERO
2.0000 | INHALATION_SPRAY | Freq: Every day | RESPIRATORY_TRACT | Status: DC
  Administered 2016-06-05: 2 via RESPIRATORY_TRACT
  Filled 2016-06-04: qty 8.8

## 2016-06-04 MED ORDER — POTASSIUM CHLORIDE ER 20 MEQ PO TBCR: 20.0000 meq | EXTENDED_RELEASE_TABLET | Freq: Every day | ORAL | Status: DC

## 2016-06-04 MED ORDER — ENSURE ENLIVE PO LIQD: 237.0000 mL | Freq: Two times a day (BID) | ORAL | Status: DC

## 2016-06-04 MED ORDER — DOXEPIN HCL 10 MG PO CAPS
10.0000 mg | ORAL_CAPSULE | Freq: Every evening | ORAL | Status: DC | PRN
  Administered 2016-06-05: 20 mg via ORAL
  Filled 2016-06-04 (×2): qty 2

## 2016-06-04 MED ORDER — THIAMINE HCL 100 MG/ML IJ SOLN: 100.0000 mg | Freq: Every day | INTRAMUSCULAR | Status: DC

## 2016-06-04 MED ORDER — VERAPAMIL HCL ER 180 MG PO TBCR: 180.0000 mg | EXTENDED_RELEASE_TABLET | Freq: Every day | ORAL | Status: DC

## 2016-06-04 MED ORDER — PANTOPRAZOLE SODIUM 40 MG PO TBEC
40.0000 mg | DELAYED_RELEASE_TABLET | Freq: Every day | ORAL | Status: DC
  Administered 2016-06-04 – 2016-06-06 (×3): 40 mg via ORAL
  Filled 2016-06-04 (×3): qty 1

## 2016-06-04 MED ORDER — HYDROCODONE-ACETAMINOPHEN 5-325 MG PO TABS: 1.0000 | ORAL_TABLET | ORAL | Status: DC | PRN

## 2016-06-04 MED ORDER — LEVOTHYROXINE SODIUM 25 MCG PO TABS
25.0000 ug | ORAL_TABLET | Freq: Every day | ORAL | Status: DC
  Administered 2016-06-05 – 2016-06-06 (×2): 25 ug via ORAL
  Filled 2016-06-04 (×2): qty 1

## 2016-06-04 MED ORDER — ONDANSETRON HCL 4 MG/2ML IJ SOLN: 4.0000 mg | Freq: Four times a day (QID) | INTRAMUSCULAR | Status: DC | PRN

## 2016-06-04 MED ORDER — SPIRONOLACTONE 25 MG PO TABS
50.0000 mg | ORAL_TABLET | Freq: Every day | ORAL | Status: DC
  Administered 2016-06-05 – 2016-06-06 (×2): 50 mg via ORAL
  Filled 2016-06-04 (×2): qty 2

## 2016-06-04 MED ORDER — ACETAMINOPHEN 325 MG PO TABS: 650.0000 mg | ORAL_TABLET | Freq: Four times a day (QID) | ORAL | Status: DC | PRN

## 2016-06-04 MED ORDER — ALBUTEROL SULFATE (2.5 MG/3ML) 0.083% IN NEBU: 2.5000 mg | INHALATION_SOLUTION | Freq: Four times a day (QID) | RESPIRATORY_TRACT | Status: DC | PRN

## 2016-06-04 MED ORDER — LORAZEPAM 1 MG PO TABS
1.0000 mg | ORAL_TABLET | Freq: Four times a day (QID) | ORAL | Status: DC | PRN
  Administered 2016-06-05: 1 mg via ORAL
  Filled 2016-06-04: qty 1

## 2016-06-04 MED ORDER — HYDRALAZINE HCL 20 MG/ML IJ SOLN: 10.0000 mg | Freq: Three times a day (TID) | INTRAMUSCULAR | Status: DC | PRN

## 2016-06-04 MED ORDER — HYOSCYAMINE SULFATE ER 0.375 MG PO TB12
0.3750 mg | ORAL_TABLET | Freq: Every day | ORAL | Status: DC
  Administered 2016-06-04 – 2016-06-06 (×3): 0.375 mg via ORAL
  Filled 2016-06-04 (×3): qty 1

## 2016-06-04 MED ORDER — ALBUTEROL SULFATE HFA 108 (90 BASE) MCG/ACT IN AERS: 2.0000 | INHALATION_SPRAY | Freq: Four times a day (QID) | RESPIRATORY_TRACT | Status: DC | PRN

## 2016-06-04 MED ORDER — FLUTICASONE PROPIONATE 50 MCG/ACT NA SUSP
2.0000 | Freq: Every day | NASAL | Status: DC
  Administered 2016-06-05: 2 via NASAL
  Filled 2016-06-04: qty 16

## 2016-06-04 MED ORDER — ENOXAPARIN SODIUM 40 MG/0.4ML ~~LOC~~ SOLN: 40.0000 mg | SUBCUTANEOUS | Status: DC

## 2016-06-04 MED ORDER — ONDANSETRON HCL 4 MG PO TABS: 4.0000 mg | ORAL_TABLET | Freq: Four times a day (QID) | ORAL | Status: DC | PRN

## 2016-06-04 MED ORDER — TADALAFIL (PAH) 20 MG PO TABS
40.0000 mg | ORAL_TABLET | Freq: Every day | ORAL | Status: DC
  Administered 2016-06-04 – 2016-06-06 (×3): 40 mg via ORAL
  Filled 2016-06-04 (×3): qty 2

## 2016-06-04 MED ORDER — VERAPAMIL HCL ER 180 MG PO TBCR
180.0000 mg | EXTENDED_RELEASE_TABLET | Freq: Every day | ORAL | Status: DC
  Administered 2016-06-04 – 2016-06-05 (×2): 180 mg via ORAL
  Filled 2016-06-04 (×2): qty 1

## 2016-06-04 MED ORDER — BISACODYL 10 MG RE SUPP: 10.0000 mg | Freq: Every day | RECTAL | Status: DC | PRN

## 2016-06-04 NOTE — Progress Notes (Signed)
Septated to telemetry observation at [facility]. Admission is for syncopal episode and right-sided chest pain. Doubt the chest pain is cardiac in nature. I have asked that orthostatic vital signs be obtained in a 1 L bolus be given to the patient prior to transfer to [facility].  Linna Darner, MD [facility] 06/04/2016, 11:44 AM

## 2016-06-04 NOTE — ED Provider Notes (Signed)
[facility] DEPT [facility] Provider Note   CSN: [id number] Arrival date & time: 06/04/16  0807  First Provider Contact:  First MD Initiated Contact with Patient 06/04/16 [phone]        History   Chief Complaint Chief Complaint  Patient presents with  . Chest Pain    HPI Darren Mueller is a 65 y.o. male.  HPI   Patient is a 65 year old male with history of CAD alcoholic cirrhosis, gout, hypertension, COPD, renal insufficiency, seizures. Patient was seen upstairs for pain in the right chest and sent here for further evaluation. Patient went to primary care physician today because he was seen by his GI physician yesterday. The GI physician said they should be seen by his primary for his cough and right-sided chest pain. Patient had seen his cardiologist Dr. Zoila Shutter a week ago and was diagnosed with bronchitis  and treated with a week of abx. Patient says he still has this cough and occasional right-sided chest pain and sore throat. Patient's apin is not associated with deep breaths or exertion.  Patient also noted redness to his right forearm.  Patient is very difficult historian.  Past Medical History:  Diagnosis Date  . Alcohol dependence ([place]) 03/22/2011   In remission   . Alcohol dependence in remission ([place]) 03/22/2011   In remission   . Alcoholic cirrhosis of liver with ascites ([place]) 2014  . Dermatitis 06/10/2015  . Diarrhea 09/04/2013  . GERD (gastroesophageal reflux disease)   . Gout   . Heart murmur   . Hx of colonic polyps   . Hypertension   . Hypopotassemia   . Otalgia 11/28/2013  . Other chronic pulmonary heart diseases   . Red eye 03/03/2016   right  . Renal insufficiency 07/18/2013  . Seizures ([place])   . Sleep apnea    does not wear CPAP  . Unspecified hypothyroidism 01/25/2013  . Unspecified pleural effusion     Patient Active Problem List   Diagnosis Date Noted  . Acute bronchitis 05/01/2016  . PAH (pulmonary artery hypertension) ([place])   . Chest pain,  precordial   . CHF exacerbation ([place]) 04/14/2016  . Systolic CHF, acute on chronic ([place]) 04/14/2016  . Acute on chronic heart failure ([place])   . Chest pain   . Red eye 03/03/2016  . Pulmonary hypertension ([place])   . Chest wall pain 12/09/2015  . Midline low back pain without sciatica 12/09/2015  . Cirrhosis with alcoholism ([place]) 08/08/2015  . Pancytopenia ([place]) 06/10/2015  . Dermatitis 06/10/2015  . OSA (obstructive sleep apnea) 02/13/2015  . Gout 07/30/2014  . Otitis, externa, infective 03/19/2014  . Nonspecific (abnormal) findings on radiological and other examination of gastrointestinal tract 12/08/2013  . Otalgia 11/28/2013  . Syncope 10/18/2013  . Diarrhea 09/04/2013  . Chronic renal insufficiency, stage III (moderate) 07/18/2013  . Right heart failure due to pulmonary hypertension ([place]) 06/06/2013  . Hypothyroidism 01/25/2013  . PAH (pulmonary arterial hypertension) with portal hypertension ([place]) 01/21/2013  . Dyspnea 01/12/2013  . Allergic rhinitis 12/21/2012  . Secondary pulmonary hypertension ([place]) 12/09/2012  . Hepatic cirrhosis ([place]) 11/26/2012  . Obstructive chronic bronchitis without exacerbation ([place]) 11/01/2012  . GERD (gastroesophageal reflux disease) 09/05/2011  . Hypokalemia 08/29/2011  . Macrocytosis 08/29/2011  . Colon polyps 03/26/2011  . Insomnia 03/26/2011  . Hypertension 03/22/2011  . Alcohol dependence ([place]) 03/22/2011  . Seizure disorder ([place]) 03/22/2011    Past Surgical History:  Procedure Laterality Date  . CARDIAC CATHETERIZATION N/A 12/21/2015   Procedure: Right  Heart Cath;  Surgeon: Jolaine Artist, MD;  Location: [facility];  Service: Cardiovascular;  Laterality: N/A;  . CARDIAC CATHETERIZATION N/A 04/15/2016   Procedure: Right/Left Heart Cath and Coronary Angiography;  Surgeon: Jolaine Artist, MD;  Location: [facility];  Service: Cardiovascular;  Laterality: N/A;  . COLONOSCOPY N/A 12/08/2013   Procedure: COLONOSCOPY;   Surgeon: Milus Banister, MD;  Location: [facility];  Service: Endoscopy;  Laterality: N/A;  . ESOPHAGOGASTRODUODENOSCOPY (EGD) WITH PROPOFOL N/A 03/27/2016   Procedure: ESOPHAGOGASTRODUODENOSCOPY (EGD) WITH PROPOFOL;  Surgeon: Milus Banister, MD;  Location: [facility];  Service: Endoscopy;  Laterality: N/A;  . RIGHT HEART CATHETERIZATION Right 06/06/2014   Procedure: RIGHT HEART CATH;  Surgeon: Jolaine Artist, MD;  Location: [facility];  Service: Cardiovascular;  Laterality: Right;  . UVULOPALATOPHARYNGOPLASTY  1999       Home Medications    Prior to Admission medications   Medication Sig Start Date End Date Taking? Authorizing Provider  albuterol (PROVENTIL HFA;VENTOLIN HFA) 108 (90 BASE) MCG/ACT inhaler Inhale 2 puffs into the lungs every 6 (six) hours as needed for wheezing or shortness of breath.    Historical Provider, MD  benzonatate (TESSALON) 100 MG capsule Take 1 capsule (100 mg total) by mouth 3 (three) times daily as needed. 04/28/16   Edward Saguier, PA-C  doxepin (SINEQUAN) 10 MG capsule TAKE 1 TO 2 CAPSULES AT BEDTIME 04/15/16   Mosie Lukes, MD  fluticasone (FLONASE) 50 MCG/ACT nasal spray Place 2 sprays into both nostrils daily. 04/28/16   Percell Miller Saguier, PA-C  Fluticasone Propionate 0.05 % LOTN Apply daily to affected area Patient taking differently: Apply 1 application topically daily. Apply daily to affected area 05/15/15   Mosie Lukes, MD  furosemide (LASIX) 40 MG tablet Take 1 tablet (40 mg total) by mouth daily. Patient taking differently: Take 40 mg by mouth as directed. TAKE Monday, Wednesday, & Friday 04/16/16   Ripudeep Krystal Eaton, MD  levothyroxine (SYNTHROID, LEVOTHROID) 25 MCG tablet Take 1 tablet (25 mcg total) by mouth daily. 12/04/15   Mosie Lukes, MD  loperamide (ANTI-DIARRHEAL) 2 MG capsule Take 4 mg by mouth daily.     Historical Provider, MD  Macitentan (OPSUMIT) 10 MG TABS Take 10 mg by mouth daily. 03/23/14   Shaune Pascal Bensimhon, MD    mometasone-formoterol (DULERA) 100-5 MCG/ACT AERO Inhale 2 puffs into the lungs daily.     Historical Provider, MD  omeprazole (PRILOSEC) 40 MG capsule Take 1 capsule (40 mg total) by mouth daily. 12/04/15   Mosie Lukes, MD  potassium chloride 20 MEQ TBCR Take 20 mEq by mouth daily. 04/16/16   Ripudeep Krystal Eaton, MD  Selexipag (UPTRAVI) 1000 MCG TABS Take 1,000 mg by mouth every morning. 11/13/15   Shaune Pascal Bensimhon, MD  Selexipag (UPTRAVI) 800 MCG TABS Take 1 tablet (800 mcg total) by mouth every evening. 11/13/15   Jolaine Artist, MD  spironolactone (ALDACTONE) 50 MG tablet TAKE 1 TABLET DAILY 06/01/16   Mosie Lukes, MD  SYMAX-SR 0.375 MG 12 hr tablet TAKE 1 TABLET TWICE A DAY 03/02/16   Mosie Lukes, MD  Tadalafil, PAH, (ADCIRCA) 20 MG TABS TAKE 2 TABLETS (40 MG TOTAL) DAILY 01/01/16   Jolaine Artist, MD    Family History Family History  Problem Relation Age of Onset  . Heart disease Mother   . Heart attack Mother   . Hypertension Mother   . Prostate cancer Father   .  Colon cancer Neg Hx     Social History Social History  Substance Use Topics  . Smoking status: Former Smoker    Packs/day: 2.00    Years: 5.00    Types: Cigarettes    Quit date: 09/22/1979  . Smokeless tobacco: Never Used  . Alcohol use 0.0 oz/week     Comment: 2 vodka tonic mon, wed, friday     Allergies   Aspirin   Review of Systems Review of Systems  Constitutional: Negative for activity change.  HENT: Positive for sinus pressure.   Respiratory: Positive for cough and shortness of breath.   Cardiovascular: Positive for chest pain. Negative for leg swelling.  Gastrointestinal: Negative for abdominal pain.  Skin: Positive for rash.  All other systems reviewed and are negative.    Physical Exam Updated Vital Signs BP 150/91 (BP Location: Right Arm)   Pulse 83   Temp 98.4 F (36.9 C) (Oral)   Resp 24   SpO2 98%   Physical Exam  Constitutional: He appears well-developed and  well-nourished.  HENT:  Head: Normocephalic and atraumatic.  Eyes: Conjunctivae are normal.  Neck: Neck supple.  Mild erythema to throat  Cardiovascular: Normal rate and regular rhythm.   No murmur heard. Pulmonary/Chest: Effort normal and breath sounds normal. No respiratory distress.  Abdominal: Soft. There is no tenderness.  Musculoskeletal: He exhibits no edema.  Neurological: He is alert.  Skin: Skin is warm and dry.  Petechial rash to R arm  Psychiatric: He has a normal mood and affect.  Nursing note and vitals reviewed.    ED Treatments / Results  Labs (all labs ordered are listed, but only abnormal results are displayed) Labs Reviewed  BASIC METABOLIC PANEL - Abnormal; Notable for the following:       Result Value   Sodium 133 (*)    CO2 21 (*)    BUN 24 (*)    Creatinine, Ser 1.48 (*)    Calcium 8.8 (*)    GFR calc non Af Amer 48 (*)    GFR calc Af Amer 55 (*)    All other components within normal limits  CBC - Abnormal; Notable for the following:    WBC 2.1 (*)    RBC 3.44 (*)    Hemoglobin 12.5 (*)    HCT 35.6 (*)    MCV 103.5 (*)    MCH 36.3 (*)    Platelets 51 (*)    All other components within normal limits  PROTIME-INR - Abnormal; Notable for the following:    Prothrombin Time 15.8 (*)    All other components within normal limits  BRAIN NATRIURETIC PEPTIDE - Abnormal; Notable for the following:    B Natriuretic Peptide 171.9 (*)    All other components within normal limits  RAPID STREP SCREEN (NOT AT [facility])  CULTURE, GROUP A STREP [facility])  TROPONIN I  D-DIMER, QUANTITATIVE (NOT AT [facility])  MONONUCLEOSIS SCREEN    EKG  EKG Interpretation  Date/Time:  Wednesday June 04 2016 08:15:29 EDT Ventricular Rate:  79 PR Interval:    QRS Duration: 102 QT Interval:  422 QTC Calculation: 484 R Axis:   112 Text Interpretation:  Sinus rhythm Prolonged PR interval Posterior infarct, acute (LCx) ST depression V1-V3, suggest recording posterior leads No  significant change since last tracing Confirmed by Gerald Leitz ([id number]) on 06/04/2016 8:22:24 AM       Radiology Dg Chest 2 View  Result Date: 06/04/2016 CLINICAL DATA:  Cough and chest pain for  the past week ; former smoker, cirrhosis with ascites, CHF, secondary pulmonary hypertension and right heart failure, chronic renal insufficiency EXAM: CHEST  2 VIEW COMPARISON:  PA and lateral chest x-ray of April 28, 2016 FINDINGS: The lungs are adequately inflated. There is no focal infiltrate. The interstitial markings are coarse though stable. The cardiac silhouette is enlarged. The pulmonary vascularity is normal. The mediastinum is normal in width. There is a small amount of calcification in the wall of the aortic arch. There is no pleural effusion. The observed bony thorax is unremarkable. IMPRESSION: Mild stable cardiomegaly. No CHF, pneumonia, nor other acute cardiopulmonary abnormality. Aortic atherosclerosis. Electronically Signed   By: David  Martinique M.D.   On: 06/04/2016 09:03   Ct Head Wo Contrast  Result Date: 06/04/2016 CLINICAL DATA:  Syncope last night, hit left side of head on counter top. Headache. EXAM: CT HEAD WITHOUT CONTRAST TECHNIQUE: Contiguous axial images were obtained from the base of the skull through the vertex without intravenous contrast. COMPARISON:  03/01/2011 FINDINGS: No acute intracranial abnormality. Specifically, no hemorrhage, hydrocephalus, mass lesion, acute infarction, or significant intracranial injury. No acute calvarial abnormality. Mucosal thickening in the sphenoid sinuses. No air-fluid levels. Mastoid air cells are clear. IMPRESSION: No acute intracranial abnormality. Chronic sphenoid sinusitis. Electronically Signed   By: Rolm Baptise M.D.   On: 06/04/2016 10:26    Procedures Procedures (including critical care time)  Medications Ordered in ED Medications - No data to display   Initial Impression / Assessment and Plan / ED Course  I have reviewed the  triage vital signs and the nursing notes.  Pertinent labs & imaging results that were available during my care of the patient were reviewed by me and considered in my medical decision making (see chart for details).  Clinical Course   Patient is a 65 year old male past medical history significant for CAD, alcoholic liver disease, COPD, presenting today with right-sided chest pain, shortness of breath occasionally, sore throat. Patient is going on for about a week however when questioned further he says he was treated last week for by different physician. The timeline of history is pretty unclear at this point. We will get labs, including d-dimer given chest pain. We'll get troponin, ekg, chest xray and run strep and mono spot.   Patient does have a small amount of petechiae on right arm.- Likely from thrombocytopenia which appears to be chronic.  In reviewing notes it appears the patient has been complaining of a cough since mid June.  9:34 AM Upstairs provider finished his note. Apparently the patient told him he is very dizzy and had a syncopal event and hit his head. We will now order CAT scan. Patient's labs all appear baseline for him. D-dimer is negative. Chest x-ray shows no pneumonia. Patient already had treatment with Doxy from [place] last week.  Thr cough likely a post infectious cough that may linger. As for his chest pain: it is more on the right chest wall and has been off for the last several days.  With a negative troponin I feel that is not likely to be ischemic.  Also according to notes he had a normal heart cath within the last two months (6/17)  Retalked to patient and he said that yesterday evening he put his chicken on the grill and then woke up on the ground. His son had witnessed him sycnopize without prodrome. I think obs admission for cardiac monitioring is therefore necessary.      Final Clinical Impressions(s) /  ED Diagnoses   Final diagnoses:  None    New  Prescriptions New Prescriptions   No medications on file     Tyrina Hines Julio Alm, MD 06/04/16 1109

## 2016-06-04 NOTE — Progress Notes (Signed)
Patient with history of hypertension, pulmonary hypertension, COPD and CKD presents to clinic today c/o cough and chest congestion x 2 days. Patient endorses last night noting right-sided chest pain, lightheadedness, dizziness and worsening SOB. Notes a syncopal event last night, hitting his head on the floor. Has had significant headache and worsening dizziness since that time. Denies fever, chills, nausea or vomiting. Denies vision changes. Denies focal weakness or difficulty with speech. He is by himself this morning.   Past Medical History:  Diagnosis Date  . Alcohol dependence ([place]) 03/22/2011   In remission   . Alcohol dependence in remission ([place]) 03/22/2011   In remission   . Alcoholic cirrhosis of liver with ascites ([place]) 2014  . Dermatitis 06/10/2015  . Diarrhea 09/04/2013  . GERD (gastroesophageal reflux disease)   . Gout   . Heart murmur   . Hx of colonic polyps   . Hypertension   . Hypopotassemia   . Otalgia 11/28/2013  . Other chronic pulmonary heart diseases   . Red eye 03/03/2016   right  . Renal insufficiency 07/18/2013  . Seizures ([place])   . Sleep apnea    does not wear CPAP  . Unspecified hypothyroidism 01/25/2013  . Unspecified pleural effusion     Current Outpatient Prescriptions on File Prior to Visit  Medication Sig Dispense Refill  . albuterol (PROVENTIL HFA;VENTOLIN HFA) 108 (90 BASE) MCG/ACT inhaler Inhale 2 puffs into the lungs every 6 (six) hours as needed for wheezing or shortness of breath.    . benzonatate (TESSALON) 100 MG capsule Take 1 capsule (100 mg total) by mouth 3 (three) times daily as needed. 21 capsule 0  . doxepin (SINEQUAN) 10 MG capsule TAKE 1 TO 2 CAPSULES AT BEDTIME 180 capsule 1  . doxycycline (DORYX) 100 MG EC tablet Take 1 tablet (100 mg total) by mouth 2 (two) times daily. 14 tablet 0  . fluticasone (FLONASE) 50 MCG/ACT nasal spray Place 2 sprays into both nostrils daily. 16 g 1  . Fluticasone Propionate 0.05 % LOTN Apply daily to  affected area (Patient taking differently: Apply 1 application topically daily. Apply daily to affected area) 3 Bottle 3  . furosemide (LASIX) 40 MG tablet Take 1 tablet (40 mg total) by mouth daily. 30 tablet 3  . levothyroxine (SYNTHROID, LEVOTHROID) 25 MCG tablet Take 1 tablet (25 mcg total) by mouth daily. 90 tablet 1  . loperamide (ANTI-DIARRHEAL) 2 MG capsule Take 4 mg by mouth daily.     . Macitentan (OPSUMIT) 10 MG TABS Take 10 mg by mouth daily. 90 tablet 3  . mometasone-formoterol (DULERA) 100-5 MCG/ACT AERO Inhale 2 puffs into the lungs daily.     Marland Kitchen omeprazole (PRILOSEC) 40 MG capsule Take 1 capsule (40 mg total) by mouth daily. 90 capsule 2  . potassium chloride 20 MEQ TBCR Take 20 mEq by mouth daily. 30 tablet 3  . Selexipag (UPTRAVI) 1000 MCG TABS Take 1,000 mg by mouth every morning. 90 tablet 2  . Selexipag (UPTRAVI) 800 MCG TABS Take 1 tablet (800 mcg total) by mouth every evening. 90 tablet 2  . spironolactone (ALDACTONE) 50 MG tablet TAKE 1 TABLET DAILY 90 tablet 0  . SYMAX-SR 0.375 MG 12 hr tablet TAKE 1 TABLET TWICE A DAY 180 tablet 1  . Tadalafil, PAH, (ADCIRCA) 20 MG TABS TAKE 2 TABLETS (40 MG TOTAL) DAILY 180 tablet 2   No current facility-administered medications on file prior to visit.     Allergies  Allergen Reactions  .  Aspirin Other (See Comments)    hypertension    Family History  Problem Relation Age of Onset  . Heart disease Mother   . Heart attack Mother   . Hypertension Mother   . Prostate cancer Father   . Colon cancer Neg Hx     Social History   Social History  . Marital status: Divorced    Spouse name: N/A  . Number of children: 3  . Years of education: N/A   Occupational History  . Retired     Social research officer, government   Social History Main Topics  . Smoking status: Former Smoker    Packs/day: 2.00    Years: 5.00    Types: Cigarettes    Quit date: 09/22/1979  . Smokeless tobacco: Never Used  . Alcohol use 0.0 oz/week     Comment: 2 vodka tonic  mon, wed, friday  . Drug use: No  . Sexual activity: Not on file   Other Topics Concern  . Not on file   Social History Narrative   0 caffeine drinks daily     Review of Systems - See HPI.  All other ROS are negative.  There were no vitals taken for this visit.  Physical Exam  Constitutional: He is oriented to person, place, and time. He appears distressed.  HENT:  Head: Normocephalic and atraumatic.  Mouth/Throat: Oropharynx is clear and moist.  Eyes: Conjunctivae are normal. Pupils are equal, round, and reactive to light.  Neck: Neck supple.  Cardiovascular: Normal rate, regular rhythm, normal heart sounds and intact distal pulses.   Pulmonary/Chest: Tachypnea noted. He is in respiratory distress. He has wheezes. He has no rales.  Neurological: He is alert and oriented to person, place, and time. No cranial nerve deficit.  Psychiatric:  Anxious  Vitals reviewed.   Recent Results (from the past 2160 hour(s))  Basic metabolic panel     Status: Abnormal   Collection Time: 04/14/16  1:49 AM  Result Value Ref Range   Sodium 136 135 - 145 mmol/L   Potassium 3.1 (L) 3.5 - 5.1 mmol/L   Chloride 108 101 - 111 mmol/L   CO2 19 (L) 22 - 32 mmol/L   Glucose, Bld 87 65 - 99 mg/dL   BUN 12 6 - 20 mg/dL   Creatinine, Ser 1.39 (H) 0.61 - 1.24 mg/dL   Calcium 8.8 (L) 8.9 - 10.3 mg/dL   GFR calc non Af Amer 51 (L) >60 mL/min   GFR calc Af Amer 59 (L) >60 mL/min    Comment: (NOTE) The eGFR has been calculated using the CKD EPI equation. This calculation has not been validated in all clinical situations. eGFR's persistently <60 mL/min signify possible Chronic Kidney Disease.    Anion gap 9 5 - 15  CBC     Status: Abnormal   Collection Time: 04/14/16  1:49 AM  Result Value Ref Range   WBC 2.6 (L) 4.0 - 10.5 K/uL   RBC 3.60 (L) 4.22 - 5.81 MIL/uL   Hemoglobin 12.3 (L) 13.0 - 17.0 g/dL   HCT 36.9 (L) 39.0 - 52.0 %   MCV 102.5 (H) 78.0 - 100.0 fL   MCH 34.2 (H) 26.0 - 34.0 pg    MCHC 33.3 30.0 - 36.0 g/dL   RDW 15.6 (H) 11.5 - 15.5 %   Platelets 46 (L) 150 - 400 K/uL    Comment: SPECIMEN CHECKED FOR CLOTS PLATELET COUNT CONFIRMED BY SMEAR   Brain natriuretic peptide     Status:  Abnormal   Collection Time: 04/14/16  1:49 AM  Result Value Ref Range   B Natriuretic Peptide 543.9 (H) 0.0 - 100.0 pg/mL  I-stat troponin, ED     Status: None   Collection Time: 04/14/16  1:53 AM  Result Value Ref Range   Troponin i, poc 0.02 0.00 - 0.08 ng/mL   Comment 3            Comment: Due to the release kinetics of cTnI, a negative result within the first hours of the onset of symptoms does not rule out myocardial infarction with certainty. If myocardial infarction is still suspected, repeat the test at appropriate intervals.   Troponin I     Status: None   Collection Time: 04/14/16  7:24 AM  Result Value Ref Range   Troponin I <0.03 <0.031 ng/mL    Comment:        NO INDICATION OF MYOCARDIAL INJURY.   D-dimer, quantitative (not at [facility])     Status: None   Collection Time: 04/14/16  7:59 AM  Result Value Ref Range   D-Dimer, Quant 0.27 0.00 - 0.50 ug/mL-FEU    Comment: (NOTE) At the manufacturer cut-off of 0.50 ug/mL FEU, this assay has been documented to exclude PE with a sensitivity and negative predictive value of 97 to 99%.  At this time, this assay has not been approved by the FDA to exclude DVT/VTE. Results should be correlated with clinical presentation.   Troponin I     Status: None   Collection Time: 04/14/16  3:35 PM  Result Value Ref Range   Troponin I <0.03 <0.031 ng/mL    Comment:        NO INDICATION OF MYOCARDIAL INJURY.   Basic metabolic panel     Status: Abnormal   Collection Time: 04/15/16  2:42 AM  Result Value Ref Range   Sodium 138 135 - 145 mmol/L   Potassium 3.2 (L) 3.5 - 5.1 mmol/L   Chloride 107 101 - 111 mmol/L   CO2 22 22 - 32 mmol/L   Glucose, Bld 104 (H) 65 - 99 mg/dL   BUN 21 (H) 6 - 20 mg/dL   Creatinine, Ser 1.62 (H)  0.61 - 1.24 mg/dL   Calcium 9.1 8.9 - 10.3 mg/dL   GFR calc non Af Amer 43 (L) >60 mL/min   GFR calc Af Amer 49 (L) >60 mL/min    Comment: (NOTE) The eGFR has been calculated using the CKD EPI equation. This calculation has not been validated in all clinical situations. eGFR's persistently <60 mL/min signify possible Chronic Kidney Disease.    Anion gap 9 5 - 15  CBC with Differential/Platelet     Status: Abnormal   Collection Time: 04/15/16  8:57 AM  Result Value Ref Range   WBC 2.8 (L) 4.0 - 10.5 K/uL   RBC 3.72 (L) 4.22 - 5.81 MIL/uL   Hemoglobin 12.7 (L) 13.0 - 17.0 g/dL   HCT 39.2 39.0 - 52.0 %   MCV 105.4 (H) 78.0 - 100.0 fL   MCH 34.1 (H) 26.0 - 34.0 pg   MCHC 32.4 30.0 - 36.0 g/dL   RDW 16.0 (H) 11.5 - 15.5 %   Platelets 44 (L) 150 - 400 K/uL    Comment: PLATELET COUNT CONFIRMED BY SMEAR REPEATED TO VERIFY    Neutrophils Relative % 57 %   Neutro Abs 1.6 (L) 1.7 - 7.7 K/uL   Lymphocytes Relative 35 %   Lymphs Abs 1.0 0.7 - 4.0  K/uL   Monocytes Relative 8 %   Monocytes Absolute 0.2 0.1 - 1.0 K/uL   Eosinophils Relative 0 %   Eosinophils Absolute 0.0 0.0 - 0.7 K/uL   Basophils Relative 0 %   Basophils Absolute 0.0 0.0 - 0.1 K/uL  Echocardiogram     Status: Abnormal   Collection Time: 04/15/16 11:52 AM  Result Value Ref Range   Weight 3,070.4 oz   Height 72 in   BP 138/86 mmHg   LV PW d 11 (A) 0.6 - 1.1 mm   FS 27 (A) 28 - 44 %   LA ID, A-P, ES 34 mm   IVS/LV PW RATIO, ED 1.64    LVOT VTI 18.6 cm   Reg peak vel 315 cm/s   LV e' LATERAL 6.85 cm/s   LA diam index 1.61 cm/m2   LA vol A4C 62.6 ml   P 1/2 time 594 ms   LVOT peak grad rest 4 mmHg   E decel time 296 msec   LVOT diameter 20 mm   LVOT area 3.14 cm2   LVOT peak vel 95.3 cm/s   LVOT SV 58 mL   MV pk E vel .6 m/s   TR max vel 315 cm/s   MV Dec 296    LA diam end sys 34 mm   TDI e' medial 3.05    TDI e' lateral 6.85    TAPSE 17.8 mm  Protime-INR     Status: Abnormal   Collection Time: 04/15/16   3:29 PM  Result Value Ref Range   Prothrombin Time 16.8 (H) 11.6 - 15.2 seconds   INR 1.35 0.00 - 1.49  I-STAT 3, arterial blood gas (G3+)     Status: Abnormal   Collection Time: 04/15/16  6:43 PM  Result Value Ref Range   pH, Arterial 7.408 7.350 - 7.450   pCO2 arterial 30.0 (L) 35.0 - 45.0 mmHg   pO2, Arterial 75.0 (L) 80.0 - 100.0 mmHg   Bicarbonate 18.9 (L) 20.0 - 24.0 mEq/L   TCO2 20 0 - 100 mmol/L   O2 Saturation 95.0 %   Acid-base deficit 5.0 (H) 0.0 - 2.0 mmol/L   Patient temperature HIDE    Sample type ARTERIAL   I-STAT 3, venous blood gas (G3P V)     Status: Abnormal   Collection Time: 04/15/16  6:51 PM  Result Value Ref Range   pH, Ven 7.386 (H) 7.250 - 7.300   pCO2, Ven 35.8 (L) 45.0 - 50.0 mmHg   pO2, Ven 32.0 31.0 - 45.0 mmHg   Bicarbonate 21.4 20.0 - 24.0 mEq/L   TCO2 23 0 - 100 mmol/L   O2 Saturation 61.0 %   Acid-base deficit 3.0 (H) 0.0 - 2.0 mmol/L   Patient temperature HIDE    Sample type VENOUS    Comment NOTIFIED PHYSICIAN   I-STAT 3, venous blood gas (G3P V)     Status: Abnormal   Collection Time: 04/15/16  6:51 PM  Result Value Ref Range   pH, Ven 7.348 (H) 7.250 - 7.300   pCO2, Ven 34.8 (L) 45.0 - 50.0 mmHg   pO2, Ven 31.0 31.0 - 45.0 mmHg   Bicarbonate 19.1 (L) 20.0 - 24.0 mEq/L   TCO2 20 0 - 100 mmol/L   O2 Saturation 58.0 %   Acid-base deficit 6.0 (H) 0.0 - 2.0 mmol/L   Patient temperature HIDE    Sample type VENOUS    Comment NOTIFIED PHYSICIAN   POCT Activated clotting time  Status: None   Collection Time: 04/15/16  7:40 PM  Result Value Ref Range   Activated Clotting Time 169 seconds  Basic metabolic panel     Status: Abnormal   Collection Time: 04/16/16  3:24 AM  Result Value Ref Range   Sodium 137 135 - 145 mmol/L   Potassium 3.7 3.5 - 5.1 mmol/L   Chloride 109 101 - 111 mmol/L   CO2 19 (L) 22 - 32 mmol/L   Glucose, Bld 105 (H) 65 - 99 mg/dL   BUN 24 (H) 6 - 20 mg/dL   Creatinine, Ser 1.59 (H) 0.61 - 1.24 mg/dL   Calcium 8.8  (L) 8.9 - 10.3 mg/dL   GFR calc non Af Amer 44 (L) >60 mL/min   GFR calc Af Amer 51 (L) >60 mL/min    Comment: (NOTE) The eGFR has been calculated using the CKD EPI equation. This calculation has not been validated in all clinical situations. eGFR's persistently <60 mL/min signify possible Chronic Kidney Disease.    Anion gap 9 5 - 15  CBC     Status: Abnormal   Collection Time: 04/16/16  3:24 AM  Result Value Ref Range   WBC 2.3 (L) 4.0 - 10.5 K/uL   RBC 3.60 (L) 4.22 - 5.81 MIL/uL   Hemoglobin 12.4 (L) 13.0 - 17.0 g/dL   HCT 38.4 (L) 39.0 - 52.0 %   MCV 106.7 (H) 78.0 - 100.0 fL   MCH 34.4 (H) 26.0 - 34.0 pg   MCHC 32.3 30.0 - 36.0 g/dL   RDW 16.0 (H) 11.5 - 15.5 %   Platelets 38 (L) 150 - 400 K/uL    Comment: PLATELET COUNT CONFIRMED BY SMEAR  CBC w/Diff     Status: Abnormal   Collection Time: 04/28/16  2:00 PM  Result Value Ref Range   WBC 4.0 4.0 - 10.5 K/uL   RBC 3.67 (L) 4.22 - 5.81 Mil/uL   Hemoglobin 12.9 (L) 13.0 - 17.0 g/dL   HCT 38.5 (L) 39.0 - 52.0 %   MCV 104.9 (H) 78.0 - 100.0 fl   MCHC 33.5 30.0 - 36.0 g/dL   RDW 16.5 (H) 11.5 - 15.5 %   Platelets (LL) 150.0 - 400.0 K/uL    44.0 Manual smear review agrees with instrument differential.   Neutrophils Relative % 74.7 43.0 - 77.0 %   Lymphocytes Relative 16.5 12.0 - 46.0 %   Monocytes Relative 8.2 3.0 - 12.0 %   Eosinophils Relative 0.6 0.0 - 5.0 %   Basophils Relative 0.0 0.0 - 3.0 %   Neutro Abs 3.0 1.4 - 7.7 K/uL   Lymphs Abs 0.7 0.7 - 4.0 K/uL   Monocytes Absolute 0.3 0.1 - 1.0 K/uL   Eosinophils Absolute 0.0 0.0 - 0.7 K/uL   Basophils Absolute 0.0 0.0 - 0.1 K/uL  Comprehensive metabolic panel     Status: Abnormal   Collection Time: 04/28/16  2:00 PM  Result Value Ref Range   Sodium 138 135 - 145 mEq/L   Potassium 4.3 3.5 - 5.1 mEq/L   Chloride 108 96 - 112 mEq/L   CO2 24 19 - 32 mEq/L   Glucose, Bld 109 (H) 70 - 99 mg/dL   BUN 20 6 - 23 mg/dL   Creatinine, Ser 1.39 0.40 - 1.50 mg/dL   Total  Bilirubin 1.1 0.2 - 1.2 mg/dL   Alkaline Phosphatase 147 (H) 39 - 117 U/L   AST 35 0 - 37 U/L   ALT 17 0 -  53 U/L   Total Protein 7.5 6.0 - 8.3 g/dL   Albumin 4.1 3.5 - 5.2 g/dL   Calcium 9.1 8.4 - 10.5 mg/dL   GFR 65.70 >60.00 mL/min  B Nat Peptide     Status: Abnormal   Collection Time: 04/28/16  2:00 PM  Result Value Ref Range   Pro B Natriuretic peptide (BNP) 383.0 (H) 0.0 - 100.0 pg/mL  Protime-INR     Status: Abnormal   Collection Time: 06/03/16  9:41 AM  Result Value Ref Range   INR 1.2 (H) 0.8 - 1.0 ratio   Prothrombin Time 12.5 9.6 - 13.1 sec  AFP tumor marker     Status: None   Collection Time: 06/03/16  9:41 AM  Result Value Ref Range   AFP-Tumor Marker 2.8 <6.1 ng/mL    Comment: Patients < 54 month old: * Pediatric range is based on full term neonates, values for premature infants may be higher.   Male: The use of AFP as a Tumor Marker in pregnant patients is not recommended.   This test was performed using the Beckman Coulter chemiluminescent method. Values obtained from different assay methods cannot be used interchangeably. AFP levels, regardless of value, should not be interpreted as absolute evidence of the presence or absence of disease.     Assessment/Plan: 1. Other chest pain EKG reveals ST depression with sign of RVH consistent with pulmonary hypertension. Normal rate. Pain not reproducible on exam. Is pleuritic and localized to R chest. Lungs with mild wheeze but otherwise CTAB. Respirations at 22 resting but with any movement go up to > 30. Is is mild distress. No accessory muscle usage noted on examination. Giving severity of symptoms coupled with chronic medical conditions, needs STAT ER assessment. Spoke with provider on call in ER downstairs who accepts patient. Patent sent downstairs to ER with RN in wheelchair for acute assessment. - EKG 12-Lead  2. SOB (shortness of breath) COPD exacerbation but concern for more acute contributing etiology.  EKG as noted above. Patient send to ER for emergent assessment. - EKG 12-Lead  3. Syncope and collapse Last night. Not witnessed. Head trauma. Severe headache since then. Neuro exam intact. Patient is respiratory distress. Sent to ER for emergent assessment. Will need CT Head as well.   Leeanne Rio, PA-C

## 2016-06-04 NOTE — ED Triage Notes (Signed)
R side chest pain, non radiating, onset last night. Denies N/V, SOB. Pt reports nasal congestion x 3 days, denies cough.

## 2016-06-04 NOTE — H&P (Signed)
History and Physical    Myles Tavella KKX:[id number] DOB: 22-Apr-1951 DOA: 06/04/2016  PCP: Penni Homans, MD  Patient coming from:  Transfer from [facility] [place]   Chief Complaint: Passed out  HPI: Schon Zeiders is a 65 y.o. male with a medical history significant for, but not  limited to, seizures, sleep apnea, GERD, hypertension and cirrhosis. She was hospitalized mid June with chest pain, acute on chronic systolic heart failure. He underwent right and left heart catheter with findings of minimal disease. Patient states his pain resolved but recurred a couple days ago. He describes the pain as being located in right chest. The pain is intermittent, very transient. The pain does not radiate anywhere. No aggravating or alleviating factors. Patient was evaluated by his gastroenterologist yesterday . GI recommended he be seen by PCP for cough and chest pain. Patient's cardiologist had seen him a week ago, diagnosed him with bronchitis and prescribed antibiotics. Patient presented to [facility] this morning for persistent cough and chest pain. The pain is different than for what he was hospitalized for in June. Patient was apparently ready for ED discharge when he reported a syncopal episode yesterday. Patient was grilling outside, went back inside to the kitchen where he passed out. Just prior to passing out patient states he became very dizzy. No shortness of breath or chest pain. No preceding visual disturbances. Patient has no idea how long he was out, thinks he may up bumped the right side of his head. Patient did wake up, got himself to a chair and mentioned the fall to his son.   ED Course:  Transferred from [facility] [place]  Review of Systems: As per HPI, otherwise 10 point review of systems negative.    Past Medical History:  Diagnosis Date  . Alcohol dependence ([place]) 03/22/2011   In remission   . Alcohol dependence in remission ([place]) 03/22/2011   In remission   .  Alcoholic cirrhosis of liver with ascites ([place]) 2014  . Dermatitis 06/10/2015  . Diarrhea 09/04/2013  . GERD (gastroesophageal reflux disease)   . Gout   . Heart murmur   . Hx of colonic polyps   . Hypertension   . Hypopotassemia   . Otalgia 11/28/2013  . Other chronic pulmonary heart diseases   . Red eye 03/03/2016   right  . Renal insufficiency 07/18/2013  . Seizures ([place])   . Sleep apnea    does not wear CPAP  . Unspecified hypothyroidism 01/25/2013  . Unspecified pleural effusion     Past Surgical History:  Procedure Laterality Date  . CARDIAC CATHETERIZATION N/A 12/21/2015   Procedure: Right Heart Cath;  Surgeon: Jolaine Artist, MD;  Location: [place] CV LAB;  Service: Cardiovascular;  Laterality: N/A;  . CARDIAC CATHETERIZATION N/A 04/15/2016   Procedure: Right/Left Heart Cath and Coronary Angiography;  Surgeon: Jolaine Artist, MD;  Location: [place] [facility];  Service: Cardiovascular;  Laterality: N/A;  . COLONOSCOPY N/A 12/08/2013   Procedure: COLONOSCOPY;  Surgeon: Milus Banister, MD;  Location: [facility];  Service: Endoscopy;  Laterality: N/A;  . ESOPHAGOGASTRODUODENOSCOPY (EGD) WITH PROPOFOL N/A 03/27/2016   Procedure: ESOPHAGOGASTRODUODENOSCOPY (EGD) WITH PROPOFOL;  Surgeon: Milus Banister, MD;  Location: [place];  Service: Endoscopy;  Laterality: N/A;  . RIGHT HEART CATHETERIZATION Right 06/06/2014   Procedure: RIGHT HEART CATH;  Surgeon: Jolaine Artist, MD;  Location: [facility] CATH LAB;  Service: Cardiovascular;  Laterality: Right;  . UVULOPALATOPHARYNGOPLASTY  1999  Social History   Social History  . Marital status: Divorced    Spouse name: N/A  . Number of children: 3  . Years of education: N/A   Occupational History  . Retired     Social research officer, government   Social History Main Topics  . Smoking status: Former Smoker    Packs/day: 2.00    Years: 5.00    Types: Cigarettes    Quit date: 09/22/1979  . Smokeless tobacco: Never Used  . Alcohol use 0.0  oz/week     Comment: 2 vodka tonic mon, wed, friday  . Drug use: No  . Sexual activity: Not on file   Other Topics Concern  . Not on file   Social History Narrative   0 caffeine drinks daily     Allergies  Allergen Reactions  . Aspirin Other (See Comments)    hypertension    Family History  Problem Relation Age of Onset  . Heart disease Mother   . Heart attack Mother   . Hypertension Mother   . Prostate cancer Father   . Colon cancer Neg Hx      Prior to Admission medications   Medication Sig Start Date End Date Taking? Authorizing Provider  albuterol (PROVENTIL HFA;VENTOLIN HFA) 108 (90 BASE) MCG/ACT inhaler Inhale 2 puffs into the lungs every 6 (six) hours as needed for wheezing or shortness of breath.    Historical Provider, MD  benzonatate (TESSALON) 100 MG capsule Take 1 capsule (100 mg total) by mouth 3 (three) times daily as needed. 04/28/16   Edward Saguier, PA-C  doxepin (SINEQUAN) 10 MG capsule TAKE 1 TO 2 CAPSULES AT BEDTIME 04/15/16   Mosie Lukes, MD  fluticasone (FLONASE) 50 MCG/ACT nasal spray Place 2 sprays into both nostrils daily. 04/28/16   Percell Miller Saguier, PA-C  Fluticasone Propionate 0.05 % LOTN Apply daily to affected area Patient taking differently: Apply 1 application topically daily. Apply daily to affected area 05/15/15   Mosie Lukes, MD  furosemide (LASIX) 40 MG tablet Take 1 tablet (40 mg total) by mouth daily. Patient taking differently: Take 40 mg by mouth as directed. TAKE Monday, Wednesday, & Friday 04/16/16   Ripudeep Krystal Eaton, MD  levothyroxine (SYNTHROID, LEVOTHROID) 25 MCG tablet Take 1 tablet (25 mcg total) by mouth daily. 12/04/15   Mosie Lukes, MD  loperamide (ANTI-DIARRHEAL) 2 MG capsule Take 4 mg by mouth daily.     Historical Provider, MD  Macitentan (OPSUMIT) 10 MG TABS Take 10 mg by mouth daily. 03/23/14   Shaune Pascal Bensimhon, MD  mometasone-formoterol (DULERA) 100-5 MCG/ACT AERO Inhale 2 puffs into the lungs daily.     Historical  Provider, MD  omeprazole (PRILOSEC) 40 MG capsule Take 1 capsule (40 mg total) by mouth daily. 12/04/15   Mosie Lukes, MD  potassium chloride 20 MEQ TBCR Take 20 mEq by mouth daily. 04/16/16   Ripudeep Krystal Eaton, MD  Selexipag (UPTRAVI) 1000 MCG TABS Take 1,000 mg by mouth every morning. 11/13/15   Shaune Pascal Bensimhon, MD  Selexipag (UPTRAVI) 800 MCG TABS Take 1 tablet (800 mcg total) by mouth every evening. 11/13/15   Jolaine Artist, MD  spironolactone (ALDACTONE) 50 MG tablet TAKE 1 TABLET DAILY 06/01/16   Mosie Lukes, MD  SYMAX-SR 0.375 MG 12 hr tablet TAKE 1 TABLET TWICE A DAY 03/02/16   Mosie Lukes, MD  Tadalafil, PAH, (ADCIRCA) 20 MG TABS TAKE 2 TABLETS (40 MG TOTAL) DAILY 01/01/16   Shaune Pascal  Bensimhon, MD    Physical Exam: Vitals:   06/04/16 0818 06/04/16 1012 06/04/16 1200 06/04/16 1341  BP:  150/91 (!) 172/103 (!) 153/91  Pulse:  83 93 83  Resp:  _0 Temp: 98.4 F (36.9 C)   98 F (36.7 C)  TempSrc: Oral   Oral  SpO2:  98% 98% 99%  Weight:    86.9 kg (191 lb 8 oz)  Height:    _1  (1.854 m)    Constitutional:  Pleasant black well developed male in NAD, calm, comfortable Vitals:   06/04/16 0818 06/04/16 1012 06/04/16 1200 06/04/16 1341  BP:  150/91 (!) 172/103 (!) 153/91  Pulse:  83 93 83  Resp:  _2 Temp: 98.4 F (36.9 C)   98 F (36.7 C)  TempSrc: Oral   Oral  SpO2:  98% 98% 99%  Weight:    86.9 kg (191 lb 8 oz)  Height:    _3  (1.854 m)   Eyes: PER, lids and conjunctivae normal ENMT: Mucous membranes are moist. Posterior pharynx clear of any exudate or lesions..  Neck: normal, supple, no masses Respiratory: clear to auscultation bilaterally, no wheezing, no crackles. Normal respiratory effort. No accessory muscle use.  Cardiovascular: Regular rate and rhythm, no murmurs / rubs / gallops. No extremity edema. 2+ pedal pulses.   Abdomen: no tenderness, no masses palpated. No hepatomegaly. Bowel sounds positive.  Musculoskeletal: no clubbing /  cyanosis. No joint deformity upper and lower extremities. Good ROM, no contractures. Normal muscle tone.  Skin: no rashes, lesions, ulcers.  Neurologic: CN 2-12 grossly intact. Sensation intact, Strength 5/5 in all 4.  Psychiatric: Normal judgment and insight. Alert and oriented x 3. Normal mood.   Labs on Admission: I have personally reviewed following labs and imaging studies   Radiological Exams on Admission: Dg Chest 2 View  Result Date: 06/04/2016 CLINICAL DATA:  Cough and chest pain for the past week ; former smoker, cirrhosis with ascites, CHF, secondary pulmonary hypertension and right heart failure, chronic renal insufficiency EXAM: CHEST  2 VIEW COMPARISON:  PA and lateral chest x-ray of April 28, 2016 FINDINGS: The lungs are adequately inflated. There is no focal infiltrate. The interstitial markings are coarse though stable. The cardiac silhouette is enlarged. The pulmonary vascularity is normal. The mediastinum is normal in width. There is a small amount of calcification in the wall of the aortic arch. There is no pleural effusion. The observed bony thorax is unremarkable. IMPRESSION: Mild stable cardiomegaly. No CHF, pneumonia, nor other acute cardiopulmonary abnormality. Aortic atherosclerosis. Electronically Signed   By: David  Martinique M.D.   On: 06/04/2016 09:03   Ct Head Wo Contrast  Result Date: 06/04/2016 CLINICAL DATA:  Syncope last night, hit left side of head on counter top. Headache. EXAM: CT HEAD WITHOUT CONTRAST TECHNIQUE: Contiguous axial images were obtained from the base of the skull through the vertex without intravenous contrast. COMPARISON:  03/01/2011 FINDINGS: No acute intracranial abnormality. Specifically, no hemorrhage, hydrocephalus, mass lesion, acute infarction, or significant intracranial injury. No acute calvarial abnormality. Mucosal thickening in the sphenoid sinuses. No air-fluid levels. Mastoid air cells are clear. IMPRESSION: No acute intracranial  abnormality. Chronic sphenoid sinusitis. Electronically Signed   By: Rolm Baptise M.D.   On: 06/04/2016 10:26    EKG: Independently reviewed.   EKG Interpretation  Date/Time:  Wednesday June 04 2016 08:15:29 EDT Ventricular Rate:  79 PR Interval:    QRS Duration: 102 QT Interval:  422 QTC Calculation: 484 R Axis:   112 Text Interpretation:  Sinus rhythm Prolonged PR interval Posterior infarct, acute (LCx) ST depression V1-V3, suggest recording posterior leads No significant change since last tracing Confirmed by Thomasene Lot, COURTNEY ([id number]) on 06/04/2016 8:22:24 AM      Study Conclusions  - Left ventricle: The cavity size was normal. There was moderate   focal basal hypertrophy of the septum. Systolic function was   normal. The estimated ejection fraction was in the range of 55%   to 60%. Wall motion was normal; there were no regional wall   motion abnormalities. Doppler parameters are consistent with   abnormal left ventricular relaxation (grade 1 diastolic   dysfunction). - Ventricular septum: The contour showed diastolic flattening. - Aortic valve: There was mild regurgitation. - Mitral valve: Calcified annulus. - Right ventricle: The cavity size was severely dilated. Wall   thickness was normal. Systolic function was severely reduced. - Pulmonary arteries: Systolic pressure was moderately increased.   PA peak pressure: 55 mm Hg (S).  Transthoracic echocardiography.  M-mode, complete 2D, spectral Doppler, and color Doppler.  Birthdate:  Patient birthdate: 1951/05/20.  Age:  Patient is 65 yr old.  Sex:  Gender: male. BMI: 26 kg/m^2.  Blood pressure:     138/86  Patient status: Inpatient.  Study date:  Study date: 04/15/2016. Study time: 11:13 AM.  Location:  Bedside.  -------------------------------------------------------------------   Assessment/Plan   Active Problems:   Syncope   GERD (gastroesophageal reflux disease)   PAH (pulmonary arterial hypertension) with  portal hypertension (HCC)   Chronic renal insufficiency, stage III (moderate)   OSA (obstructive sleep apnea)   Cirrhosis with alcoholism (HCC)   Chest wall pain   PAH (pulmonary artery hypertension) (HCC)      Syncope .  Etiology unclear, occurred yesterday. Transferred from [place] today. Head CTscan negative.  Ddimer negative.     -Place in observation-telemetry bed -Syncope order set utilized -Recent echocardiogram grade I DD, EF 55-60%. ith findings as above.  Chest wall pain, right-sided. Etiology of right chest pain likely musculoskeletal.. EKG - no new changes and first trop negative. CXR negative.  No significant CAD on right and left heart cath in June.   Chronic systolic heart failure / pulmonary HTN. BNP 171. Down from 56 late June. No edema on CXR today -Continue home diuretics, pulmonary HTN meds.  -monitor wt, intake + output  HTN, elevated BP today, hasn't had home meds -will resume home meds.  -prn hydralalzine  Cirrhosis complicated by portal hypertension. No evidence for decompensation. [facility]   CKD, 3, Cr 1.48 which is about baseline.  -avoid nephrotoxic medications  COPD with chronic bronchitis.  -Recently complete Doxycycline -continue home inhalers  OSA, can't tolerate cpap  Hypothyroidism -continue home synthroid  GERD, asymptomatic   . -continue home PPI                                   DVT prophylaxis:     SCDs   Code Status:     Full code   Family Communication:    none.  Disposition Plan:   Discharge  Home in 24-48 hours              Consults called:  None   Admission status:   Observation - Telemetry    Tye Savoy NP [facility] Pager [phone]  If 7PM-7AM, please  contact night-coverage [email or web address] Password [facility]  06/04/2016, 3:43 PM

## 2016-06-05 DIAGNOSIS — R55 Syncope and collapse: Secondary | ICD-10-CM

## 2016-06-05 DIAGNOSIS — K7030 Alcoholic cirrhosis of liver without ascites: Secondary | ICD-10-CM

## 2016-06-05 DIAGNOSIS — I272 Other secondary pulmonary hypertension: Secondary | ICD-10-CM | POA: Diagnosis not present

## 2016-06-05 DIAGNOSIS — N189 Chronic kidney disease, unspecified: Secondary | ICD-10-CM | POA: Diagnosis not present

## 2016-06-05 LAB — BASIC METABOLIC PANEL
ANION GAP: 9 (ref 5–15)
BUN: 18 mg/dL (ref 6–20)
CO2: 21 mmol/L — ABNORMAL LOW (ref 22–32)
Calcium: 8.9 mg/dL (ref 8.9–10.3)
Chloride: 104 mmol/L (ref 101–111)
Creatinine, Ser: 1.31 mg/dL — ABNORMAL HIGH (ref 0.61–1.24)
GFR, EST NON AFRICAN AMERICAN: 55 mL/min — AB (ref >60)
GLUCOSE: 94 mg/dL (ref 65–99)
POTASSIUM: 3.8 mmol/L (ref 3.5–5.1)
SODIUM: 134 mmol/L — AB (ref 135–145)

## 2016-06-05 LAB — CBC
HCT: 38.5 % — ABNORMAL LOW (ref 39.0–52.0)
HEMOGLOBIN: 12.8 g/dL — AB (ref 13.0–17.0)
MCH: 35.2 pg — ABNORMAL HIGH (ref 26.0–34.0)
MCHC: 33.2 g/dL (ref 30.0–36.0)
MCV: 105.8 fL — ABNORMAL HIGH (ref 78.0–100.0)
Platelets: 56 10*3/uL — ABNORMAL LOW (ref 150–400)
RBC: 3.64 MIL/uL — ABNORMAL LOW (ref 4.22–5.81)
RDW: 14.6 % (ref 11.5–15.5)
WBC: 2.8 10*3/uL — ABNORMAL LOW (ref 4.0–10.5)

## 2016-06-05 NOTE — Progress Notes (Signed)
PROGRESS NOTE  Darren Mueller UVO:[id number] DOB: 09-May-1951 DOA: 06/04/2016 PCP: Danise Edge, MD  Brief History:  65 year old male with a history of alcoholic liver cirrhosis, seizure disorder, GERD, hypertension, pulmonary arterial hypertension, and diastolic CHF presented to [facility] [place] for persistent cough and chest pain. Notably, the patient was seen in HF clinic on 05/01/2016 and was treated with antibiotics for bronchitis. The patient gives a history of a syncopal episode on 06/03/2016 after a prolonged episode of staining at the grill. This was preceded by dizziness without any chest discomfort or shortness of breath. In fact, the patient had been complaining of dizziness with positional changes over the last 3-4 days prior to admission. He complained of some right-sided chest discomfort, but attributed this to his chronic cough. The patient was oriented after his syncopal episode, but thought he might have hit the right side of his head. At the time of admission, CT of brain was negative. Orthostatics were positive.  Assessment/Plan: Syncope -Likely vagal in etiology with positive orthostatics--blood pressure 125/76 sitting; 107/77 standing -has been feeling dizzy for 3-4 days prior to admission -04/15/2016 echo EF 55-60 percent, grade 1 daily, no WMA, severe dilated RV -05/15/2016  heart catheterization--minimal CAD -appreciate cardiology evaluation -06/04/2016 CT brain negative  Right-sided chest pain -sinus with T-wave inversion V1-V3, III, aVF -no CP presently -05/15/2016  heart catheterization--minimal CAD -CXR neg  Chronic systolic heart failure / pulmonary HTN. -clinically euvolemic -echo as above -Review shows that previous dry weight is approximately 190 pounds -No edema on CXR -Continue home diuretics, pulmonary HTN meds.  -monitor wt, intake + output  Cirrhosis complicated by portal hypertension.  -compensated -follows [facility] -still  drinks occasional Etoh  CKD stage III -Baseline creatinine of 1.3-1.5  HTN -continue furosemide and aldactone  COPD -Compensated -Recently completed doxycycline  OSA, -can't tolerate cpap  Hypothyroidism -continue home synthroid  GERD, asymptomatic. -continue home PPI   Disposition Plan:   Home 06/06/16 if cleared by cardiology Family Communication:   No Family at bedside  Consultants:  Cardiology  Code Status:  FULL  DVT Prophylaxis:  SCDs   Procedures: As Listed in Progress Note Above  Antibiotics: None    Subjective: Patient denies fevers, chills, headache, chest pain, dyspnea, nausea, vomiting, diarrhea, abdominal pain, dysuria, hematuria, hematochezia, and melena.   Objective: Vitals:   06/05/16 0600 06/05/16 0928 06/05/16 0935 06/05/16 1003  BP: 119/82 115/74 122/90   Pulse: 69 81 87   Resp: 18 18 18    Temp: 97.5 F (36.4 C) 98.3 F (36.8 C) 98 F (36.7 C)   TempSrc: Oral Oral Oral   SpO2: 100% 99% 98% 98%  Weight: 85.4 kg (188 lb 3.2 oz)     Height:        Intake/Output Summary (Last 24 hours) at 06/05/16 1807 Last data filed at 06/05/16 1754  Gross per 24 hour  Intake             1380 ml  Output             2025 ml  Net             -645 ml   Weight change:  Exam:   General:  Pt is alert, follows commands appropriately, not in acute distress  HEENT: No icterus, No thrush, No neck mass, [place]/AT  Cardiovascular: RRR, S1/S2, no rubs, no gallops  Respiratory: CTA bilaterally, no wheezing, no crackles, no rhonchi  Abdomen: Soft/+BS, non tender, non distended, no guarding  Extremities: No edema, No lymphangitis, No petechiae, No rashes, no synovitis   Data Reviewed: I have personally reviewed following labs and imaging studies Basic Metabolic Panel:  Recent Labs Lab 06/04/16 0820 06/05/16 0314  NA 133* 134*  K 3.8 3.8  CL 103 104  CO2 21* 21*  GLUCOSE 95 94  BUN 24* 18  CREATININE 1.48* 1.31*  CALCIUM 8.8* 8.9     Liver Function Tests: No results for input(s): AST, ALT, ALKPHOS, BILITOT, PROT, ALBUMIN in the last 168 hours. No results for input(s): LIPASE, AMYLASE in the last 168 hours. No results for input(s): AMMONIA in the last 168 hours. Coagulation Profile:  Recent Labs Lab 06/03/16 0941 06/04/16 0820  INR 1.2* 1.25   CBC:  Recent Labs Lab 06/04/16 0820 06/05/16 0314  WBC 2.1* 2.8*  HGB 12.5* 12.8*  HCT 35.6* 38.5*  MCV 103.5* 105.8*  PLT 51* 56*   Cardiac Enzymes:  Recent Labs Lab 06/04/16 0820  TROPONINI <0.03   BNP: Invalid input(s): POCBNP CBG: No results for input(s): GLUCAP in the last 168 hours. HbA1C: No results for input(s): HGBA1C in the last 72 hours. Urine analysis:    Component Value Date/Time   COLORURINE YELLOW 10/18/2013 1247   APPEARANCEUR CLEAR 10/18/2013 1247   LABSPEC 1.017 10/18/2013 1247   PHURINE 5.0 10/18/2013 1247   GLUCOSEU NEGATIVE 10/18/2013 1247   HGBUR NEGATIVE 10/18/2013 1247   BILIRUBINUR NEGATIVE 10/18/2013 1247   KETONESUR NEGATIVE 10/18/2013 1247   PROTEINUR NEGATIVE 10/18/2013 1247   UROBILINOGEN 0.2 10/18/2013 1247   NITRITE NEGATIVE 10/18/2013 1247   LEUKOCYTESUR NEGATIVE 10/18/2013 1247   Sepsis Labs: @LABRCNTIP (procalcitonin:4,lacticidven:4) ) Recent Results (from the past 240 hour(s))  Rapid strep screen     Status: None   Collection Time: 06/04/16  9:20 AM  Result Value Ref Range Status   Streptococcus, Group A Screen (Direct) NEGATIVE NEGATIVE Final    Comment: (NOTE) A Rapid Antigen test may result negative if the antigen level in the sample is below the detection level of this test. The FDA has not cleared this test as a stand-alone test therefore the rapid antigen negative result has reflexed to a Group A Strep culture.   Culture, group A strep     Status: None (Preliminary result)   Collection Time: 06/04/16  9:20 AM  Result Value Ref Range Status   Specimen Description THROAT  Final   Special  Requests NONE Reflexed from [id number]  Final   Culture   Final    CULTURE REINCUBATED FOR BETTER GROWTH Performed at [facility]    Report Status PENDING  Incomplete     Scheduled Meds: . feeding supplement (ENSURE ENLIVE)  237 mL Oral BID BM  . fluticasone  2 spray Each Nare Daily  . folic acid  1 mg Oral Daily  . furosemide  40 mg Oral Daily  . hyoscyamine  0.375 mg Oral Daily  . levothyroxine  25 mcg Oral QAC breakfast  . Macitentan  10 mg Oral Daily  . mometasone-formoterol  2 puff Inhalation Daily  . multivitamin with minerals  1 tablet Oral Daily  . pantoprazole  40 mg Oral Daily  . potassium chloride  20 mEq Oral Daily  . Selexipag  1,000 mg Oral q morning - 10a  . Selexipag  800 mcg Oral QPM  . sodium chloride flush  3 mL Intravenous Q12H  . spironolactone  50 mg Oral Daily  . tadalafil (  PAH)  40 mg Oral Daily  . thiamine  100 mg Oral Daily  . verapamil  180 mg Oral QHS   Continuous Infusions:   Procedures/Studies: Dg Chest 2 View  Result Date: 06/04/2016 CLINICAL DATA:  Cough and chest pain for the past week ; former smoker, cirrhosis with ascites, CHF, secondary pulmonary hypertension and right heart failure, chronic renal insufficiency EXAM: CHEST  2 VIEW COMPARISON:  PA and lateral chest x-ray of April 28, 2016 FINDINGS: The lungs are adequately inflated. There is no focal infiltrate. The interstitial markings are coarse though stable. The cardiac silhouette is enlarged. The pulmonary vascularity is normal. The mediastinum is normal in width. There is a small amount of calcification in the wall of the aortic arch. There is no pleural effusion. The observed bony thorax is unremarkable. IMPRESSION: Mild stable cardiomegaly. No CHF, pneumonia, nor other acute cardiopulmonary abnormality. Aortic atherosclerosis. Electronically Signed   By: Keiley Levey  [place] M.D.   On: 06/04/2016 09:03   Ct Head Wo Contrast  Result Date: 06/04/2016 CLINICAL DATA:  Syncope last night, hit  left side of head on counter top. Headache. EXAM: CT HEAD WITHOUT CONTRAST TECHNIQUE: Contiguous axial images were obtained from the base of the skull through the vertex without intravenous contrast. COMPARISON:  03/01/2011 FINDINGS: No acute intracranial abnormality. Specifically, no hemorrhage, hydrocephalus, mass lesion, acute infarction, or significant intracranial injury. No acute calvarial abnormality. Mucosal thickening in the sphenoid sinuses. No air-fluid levels. Mastoid air cells are clear. IMPRESSION: No acute intracranial abnormality. Chronic sphenoid sinusitis. Electronically Signed   By: Charlett Nose M.D.   On: 06/04/2016 10:26    Willy Vorce, DO  [facility] Pager [phone]  If 7PM-7AM, please contact night-coverage [email or web address] Password [email or web address] 06/05/2016, 6:07 PM   LOS: 0 days

## 2016-06-05 NOTE — Progress Notes (Signed)
Patient's son to bring his home medications: Selexipag and Macitentan - will administer from home meds once family brings to hospital.  Patient requested bed alarm be discontinued.  He is alert and oriented x 4.  Rationale for using fall precaution of alarm explained to patient (he fell at home without recall of incident happening) and he stated understanding and requested alarm off.  Patient instructed to sit on side of bed with legs dangling before he stands and if dizzy to stay sitting and call for assistance.  Stated understanding.

## 2016-06-05 NOTE — Progress Notes (Signed)
Darren Mueller and [place] home meds taken to pharmacy and receipt for patient's home medications stored by pharmacy placed in patient's chart.    Lab work ordered for am - patient updated on plan of care.

## 2016-06-05 NOTE — Consult Note (Signed)
Advanced Heart Failure Team Consult Note  Referring Physician: Dr. Carles Collet Primary Physician: Dr. Charlett Blake Primary Cardiologist:  Dr. Haroldine Laws   Reason for Consultation: Severe PAH/ Syncope  HPI:    [name] ("[name]") is a 65 yo with history of COPD, portopulmonary hypertension with RV failure, and ETOH cirrhosis.   Last seen in HF clinic 05/01/16. Was stable from [facility] standpoint, though treated for Acute Bronchitis.   Transferred to [facility] from [facility] after presenting with persistent cough and chest pain.  He described syncopal episode 06/03/16 after a prolonged time of standing at the grill. This was preceded by dizziness, but no SOB or CP.  He states he did have some RIGHT sided chest paint that day, but was mostly related to chronic cough. He was oriented after the fall, but thought he may have struck his head.   Pertinent admission labs include K 3.8, creatinine 1.31, BNP 171, Troponin negative. CBC consistent with his history of pancytopenia.   Orthostatics negative.   Admitted for observation on tele for arrhythmias.   Has felt of his usual state of health lately.  Feels like this was an isolated incident.  Went from cooking in front of grill to air conditioned house. Has had no further symptoms. Work up thus far negative.   CT head normal CXR with no acute abnormalities.   Review of Systems: [y] = yes, _0  = no   General: Weight gain _1 ; Weight loss _2 ; Anorexia _3 ; Fatigue _4 ; Fever _5 ; Chills _6 ; Weakness _7   Cardiac: Chest pain/pressure _8 ; Resting SOB _9 ; Exertional SOB _10 ; Orthopnea _11 ; Pedal Edema _12 ; Palpitations _13 ; Syncope [y]; Presyncope _14 ; Paroxysmal nocturnal dyspnea_15   Pulmonary: Cough [y]; Wheezing_16 ; Hemoptysis_17 ; Sputum _18 ; Snoring [y]  GI: Vomiting_19 ; Dysphagia_20 ; Melena_21 ; Hematochezia _22 ; Heartburn_23 ; Abdominal pain _24 ; Constipation _25 ; Diarrhea _26 ; BRBPR _27   GU: Hematuria_28 ; Dysuria _29 ; Nocturia_30   Vascular: Pain in  legs with walking _31 ; Pain in feet with lying flat _32 ; Non-healing sores _33 ; Stroke _34 ; TIA _35 ; Slurred speech _36 ;  Neuro: Headaches_37 ; Vertigo_38 ; Seizures_39 ; Paresthesias_40 ;Blurred vision _41 ; Diplopia _42 ; Vision changes _43   Ortho/Skin: Arthritis [y]; Joint pain [y]; Muscle pain _44 ; Joint swelling _45 ; Back Pain _46 ; Rash _47   Psych: Depression_48 ; Anxiety_49   Heme: Bleeding problems _50 ; Clotting disorders _51 ; Anemia _52   Endocrine: Diabetes _53 ; Thyroid dysfunction_54   Home Medications Prior to Admission medications   Medication Sig Start Date End Date Taking? Authorizing Provider  albuterol (PROVENTIL HFA;VENTOLIN HFA) 108 (90 BASE) MCG/ACT inhaler Inhale 2 puffs into the lungs every 6 (six) hours as needed for wheezing or shortness of breath.   Yes Historical Provider, MD  benzonatate (TESSALON) 100 MG capsule Take 1 capsule (100 mg total) by mouth 3 (three) times daily as needed. 04/28/16  Yes Edward Saguier, PA-C  doxepin (SINEQUAN) 10 MG capsule TAKE 1 TO 2 CAPSULES AT BEDTIME 04/15/16  Yes Mosie Lukes, MD  Fluticasone Propionate 0.05 % LOTN Apply daily to affected area Patient taking differently: Apply 1 application topically daily. Apply daily to affected area 05/15/15  Yes Mosie Lukes, MD  furosemide (LASIX) 40 MG tablet Take 1 tablet (40 mg total) by mouth daily. Patient taking  differently: Take 40 mg by mouth as directed. TAKE Monday, Wednesday, & Friday 04/16/16  Yes Ripudeep Krystal Eaton, MD  hyoscyamine (SYMAX-SR) 0.375 MG 12 hr tablet Take 0.375 mg by mouth daily.   Yes Historical Provider, MD  levothyroxine (SYNTHROID, LEVOTHROID) 25 MCG tablet Take 1 tablet (25 mcg total) by mouth daily. 12/04/15  Yes Mosie Lukes, MD  loperamide (ANTI-DIARRHEAL) 2 MG capsule Take 4 mg by mouth as needed for diarrhea or loose stools (up to 8 capsules per day).    Yes Historical Provider, MD  Macitentan (OPSUMIT) 10 MG TABS Take 10 mg by mouth daily. 03/23/14  Yes Shaune Pascal Bensimhon, MD   mometasone-formoterol (DULERA) 100-5 MCG/ACT AERO Inhale 2 puffs into the lungs daily.    Yes Historical Provider, MD  omeprazole (PRILOSEC) 40 MG capsule Take 1 capsule (40 mg total) by mouth daily. 12/04/15  Yes Mosie Lukes, MD  potassium chloride 20 MEQ TBCR Take 20 mEq by mouth daily. 04/16/16  Yes Ripudeep Krystal Eaton, MD  Selexipag (UPTRAVI) 1000 MCG TABS Take 1,000 mg by mouth every morning. 11/13/15  Yes Shaune Pascal Bensimhon, MD  Selexipag (UPTRAVI) 800 MCG TABS Take 1 tablet (800 mcg total) by mouth every evening. 11/13/15  Yes Jolaine Artist, MD  spironolactone (ALDACTONE) 50 MG tablet TAKE 1 TABLET DAILY 06/01/16  Yes Mosie Lukes, MD  Tadalafil, PAH, (ADCIRCA) 20 MG TABS TAKE 2 TABLETS (40 MG TOTAL) DAILY 01/01/16  Yes Jolaine Artist, MD  verapamil (VERELAN PM) 180 MG 24 hr capsule Take 180 mg by mouth at bedtime.   Yes Historical Provider, MD    Past Medical History: Past Medical History:  Diagnosis Date  . Alcohol dependence ([place]) 03/22/2011   In remission   . Alcohol dependence in remission ([place]) 03/22/2011   In remission   . Alcoholic cirrhosis of liver with ascites ([place]) 2014  . Dermatitis 06/10/2015  . Diarrhea 09/04/2013  . GERD (gastroesophageal reflux disease)   . Gout   . Heart murmur   . Hx of colonic polyps   . Hypertension   . Hypopotassemia   . Otalgia 11/28/2013  . Other chronic pulmonary heart diseases   . Red eye 03/03/2016   right  . Renal insufficiency 07/18/2013  . Seizures ([place])   . Sleep apnea    does not wear CPAP  . Unspecified hypothyroidism 01/25/2013  . Unspecified pleural effusion     Past Surgical History: Past Surgical History:  Procedure Laterality Date  . CARDIAC CATHETERIZATION N/A 12/21/2015   Procedure: Right Heart Cath;  Surgeon: Jolaine Artist, MD;  Location: [place] CV LAB;  Service: Cardiovascular;  Laterality: N/A;  . CARDIAC CATHETERIZATION N/A 04/15/2016   Procedure: Right/Left Heart Cath and Coronary Angiography;   Surgeon: Jolaine Artist, MD;  Location: [place] CV LAB;  Service: Cardiovascular;  Laterality: N/A;  . COLONOSCOPY N/A 12/08/2013   Procedure: COLONOSCOPY;  Surgeon: Milus Banister, MD;  Location: [facility] ENDOSCOPY;  Service: Endoscopy;  Laterality: N/A;  . ESOPHAGOGASTRODUODENOSCOPY (EGD) WITH PROPOFOL N/A 03/27/2016   Procedure: ESOPHAGOGASTRODUODENOSCOPY (EGD) WITH PROPOFOL;  Surgeon: Milus Banister, MD;  Location: [place];  Service: Endoscopy;  Laterality: N/A;  . RIGHT HEART CATHETERIZATION Right 06/06/2014   Procedure: RIGHT HEART CATH;  Surgeon: Jolaine Artist, MD;  Location: [facility] CATH LAB;  Service: Cardiovascular;  Laterality: Right;  . UVULOPALATOPHARYNGOPLASTY  1999    Family History: Family History  Problem Relation Age of Onset  . Heart disease  Mother   . Heart attack Mother   . Hypertension Mother   . Prostate cancer Father   . Colon cancer Neg Hx     Social History: Social History   Social History  . Marital status: Divorced    Spouse name: N/A  . Number of children: 3  . Years of education: N/A   Occupational History  . Retired     Social research officer, government   Social History Main Topics  . Smoking status: Former Smoker    Packs/day: 2.00    Years: 5.00    Types: Cigarettes    Quit date: 09/22/1979  . Smokeless tobacco: Never Used  . Alcohol use 3.6 oz/week    6 Shots of liquor per week     Comment: 06/04/2016 "2 vodka & tonics on MWF"  . Drug use: No  . Sexual activity: Not Currently   Other Topics Concern  . None   Social History Narrative   0 caffeine drinks daily     Allergies:  Allergies  Allergen Reactions  . Aspirin Other (See Comments)    hypertension    Objective:    Vital Signs:   Temp:  [97.4 F (36.3 C)-98.8 F (37.1 C)] 98 F (36.7 C) (08/03 0935) Pulse Rate:  [69-87] 87 (08/03 0935) Resp:  [18] 18 (08/03 0935) BP: (115-153)/(74-91) 122/90 (08/03 0935) SpO2:  [98 %-100 %] 98 % (08/03 1003) Weight:  [188 lb 3.2 oz (85.4 kg)-191 lb  8 oz (86.9 kg)] 188 lb 3.2 oz (85.4 kg) (08/03 0600) Last BM Date: 06/04/16  Weight change: Filed Weights   06/04/16 1341 06/05/16 0600  Weight: 191 lb 8 oz (86.9 kg) 188 lb 3.2 oz (85.4 kg)    Intake/Output:   Intake/Output Summary (Last 24 hours) at 06/05/16 1207 Last data filed at 06/05/16 0950  Gross per 24 hour  Intake              900 ml  Output              900 ml  Net                0 ml     Physical Exam: General:  Well appearing. No resp difficulty.  HEENT: normal. Facial flushing. Neck: supple. JVP 7-8 . Carotids 2+ bilat;  No lymphadenopathy or thyromegaly appreciated. Cor: PMI nondisplaced. Regular rate & rhythm. 2/6 TR P2 LLSB. Lungs: CTAB, normal effort Abdomen: soft, nontender, nondistended. No hepatosplenomegaly. No bruits or masses. Good bowel sounds. Extremities: no cyanosis, clubbing, rash, edema Neuro: alert & orientedx3, cranial nerves grossly intact. moves all 4 extremities w/o difficulty. Affect pleasant  Telemetry: NSR 70s, Occasionally spiking into rates of 110s  Labs: Basic Metabolic Panel:  Recent Labs Lab 06/04/16 0820 06/05/16 0314  NA 133* 134*  K 3.8 3.8  CL 103 104  CO2 21* 21*  GLUCOSE 95 94  BUN 24* 18  CREATININE 1.48* 1.31*  CALCIUM 8.8* 8.9    Liver Function Tests: No results for input(s): AST, ALT, ALKPHOS, BILITOT, PROT, ALBUMIN in the last 168 hours. No results for input(s): LIPASE, AMYLASE in the last 168 hours. No results for input(s): AMMONIA in the last 168 hours.  CBC:  Recent Labs Lab 06/04/16 0820 06/05/16 0314  WBC 2.1* 2.8*  HGB 12.5* 12.8*  HCT 35.6* 38.5*  MCV 103.5* 105.8*  PLT 51* 56*    Cardiac Enzymes:  Recent Labs Lab 06/04/16 0820  TROPONINI <0.03    BNP: BNP (last  3 results)  Recent Labs  11/13/15 1015 04/14/16 0149 06/04/16 0820  BNP 317.9* 543.9* 171.9*    ProBNP (last 3 results)  Recent Labs  04/28/16 1400  PROBNP 383.0*     CBG: No results for input(s): GLUCAP  in the last 168 hours.  Coagulation Studies:  Recent Labs  06/03/16 0941 06/04/16 0820  LABPROT 12.5 15.8*  INR 1.2* 1.25    Other results: EKG: NSR 79 bpm  Imaging: Dg Chest 2 View  Result Date: 06/04/2016 CLINICAL DATA:  Cough and chest pain for the past week ; former smoker, cirrhosis with ascites, CHF, secondary pulmonary hypertension and right heart failure, chronic renal insufficiency EXAM: CHEST  2 VIEW COMPARISON:  PA and lateral chest x-ray of April 28, 2016 FINDINGS: The lungs are adequately inflated. There is no focal infiltrate. The interstitial markings are coarse though stable. The cardiac silhouette is enlarged. The pulmonary vascularity is normal. The mediastinum is normal in width. There is a small amount of calcification in the wall of the aortic arch. There is no pleural effusion. The observed bony thorax is unremarkable. IMPRESSION: Mild stable cardiomegaly. No CHF, pneumonia, nor other acute cardiopulmonary abnormality. Aortic atherosclerosis. Electronically Signed   By: David  Martinique M.D.   On: 06/04/2016 09:03   Ct Head Wo Contrast  Result Date: 06/04/2016 CLINICAL DATA:  Syncope last night, hit left side of head on counter top. Headache. EXAM: CT HEAD WITHOUT CONTRAST TECHNIQUE: Contiguous axial images were obtained from the base of the skull through the vertex without intravenous contrast. COMPARISON:  03/01/2011 FINDINGS: No acute intracranial abnormality. Specifically, no hemorrhage, hydrocephalus, mass lesion, acute infarction, or significant intracranial injury. No acute calvarial abnormality. Mucosal thickening in the sphenoid sinuses. No air-fluid levels. Mastoid air cells are clear. IMPRESSION: No acute intracranial abnormality. Chronic sphenoid sinusitis. Electronically Signed   By: Rolm Baptise M.D.   On: 06/04/2016 10:26      Medications:     Current Medications: . feeding supplement (ENSURE ENLIVE)  237 mL Oral BID BM  . fluticasone  2 spray Each Nare  Daily  . folic acid  1 mg Oral Daily  . furosemide  40 mg Oral Daily  . hyoscyamine  0.375 mg Oral Daily  . levothyroxine  25 mcg Oral QAC breakfast  . Macitentan  10 mg Oral Daily  . mometasone-formoterol  2 puff Inhalation Daily  . multivitamin with minerals  1 tablet Oral Daily  . pantoprazole  40 mg Oral Daily  . potassium chloride  20 mEq Oral Daily  . Selexipag  1,000 mg Oral q morning - 10a  . Selexipag  800 mcg Oral QPM  . sodium chloride flush  3 mL Intravenous Q12H  . spironolactone  50 mg Oral Daily  . tadalafil (PAH)  40 mg Oral Daily  . thiamine  100 mg Oral Daily  . verapamil  180 mg Oral QHS     Infusions:      Assessment   1. Syncope 2. PAH - Suspected portopulmonary HTN 3. CKD stage III 4. Cirrhosis 5. HTN 6. OSA 7. Pancytopenia   Plan    Volume status looks stable on exam.   Unclear what caused syncope but agree with IM likely vasovagal.   He may have been somewhat dry from the heat.   Labs and work up thus far unremarkable.   Occasionally having faster heart rates, but may be just related to activity.   [place] 12/21/15 with severe PAH despite triple therapy.  He seems relatively stable from this standpoint.  Continue current medications.   Length of Stay: 0  Shirley Friar PA-C 06/05/2016, 12:07 PM  Advanced Heart Failure Team Pager [phone] (M-F; 7a - 4p)  Please contact [facility] for night-coverage after hours (4p -7a ) and weekends on [email or web address]  Patient seen and examined with Oda Kilts, PA-C. We discussed all aspects of the encounter. I agree with the assessment and plan as stated above.   Suspect syncope related to volume depletion/venous pooling in setting of PAH and RV failure (poor venous return to RV). Need to be careful with his lasix dosing.Recent RHC reviewed with him. Can likely go home tomorrow if can ambulate without symptoms.   Bensimhon, Daniel,MD 11:41 PM

## 2016-06-05 NOTE — Care Management Obs Status (Signed)
[facility] NOTIFICATION   Patient Details  Name: Darren Mueller MRN: [id number] Date of Birth: 24-Mar-1951   Medicare Observation Status Notification Given:  Yes    Royston Bake, RN 06/05/2016, 1:57 PM

## 2016-06-05 NOTE — Progress Notes (Signed)
Nutrition Brief Note  Patient identified on the Malnutrition Screening Tool (MST) Report  Wt Readings from Last 15 Encounters:  06/05/16 188 lb 3.2 oz (85.4 kg)  06/04/16 197 lb 2 oz (89.4 kg)  06/03/16 196 lb 12.8 oz (89.3 kg)  05/01/16 195 lb (88.5 kg)  04/28/16 195 lb 3.2 oz (88.5 kg)  04/16/16 190 lb 1.6 oz (86.2 kg)  03/11/16 203 lb 12.8 oz (92.4 kg)  03/03/16 203 lb 6 oz (92.3 kg)  01/30/16 208 lb (94.3 kg)  01/29/16 203 lb (92.1 kg)  12/25/15 206 lb 12.8 oz (93.8 kg)  12/21/15 203 lb (92.1 kg)  12/17/15 211 lb 8 oz (95.9 kg)  12/13/15 203 lb (92.1 kg)  12/04/15 207 lb (93.9 kg)    Body mass index is 24.83 kg/m. Patient meets criteria for Normal Weight based on current BMI. Pt states that he has gradually been losing weight due to a heart medication he started 1.5 years ago. Weight history shows 7% wt loss in the past 6 months which is not significant for time frame. The medication caused a slight decrease in his appetite, but he describes his appetite as good and feels that he eats well. He reports eating well PTA and normally today. He denies any nutrition questions or concerns at this time. RD encouraged adequate and consistent healthful PO intake.   Current diet order is Heart Healthy, patient is consuming approximately 50% of meals at this time. Labs and medications reviewed.   No nutrition interventions warranted at this time. If nutrition issues arise, please consult RD.   Scarlette Ar RD, LDN Inpatient Clinical Dietitian Pager: [phone] After Hours Pager: [phone]

## 2016-06-06 ENCOUNTER — Ambulatory Visit (HOSPITAL_COMMUNITY): Admission: RE | Admit: 2016-06-06 | Payer: Medicare Other | Source: Ambulatory Visit

## 2016-06-06 ENCOUNTER — Telehealth: Payer: Self-pay | Admitting: Family Medicine

## 2016-06-06 DIAGNOSIS — R000 Tachycardia, unspecified: Secondary | ICD-10-CM

## 2016-06-06 DIAGNOSIS — K766 Portal hypertension: Secondary | ICD-10-CM

## 2016-06-06 DIAGNOSIS — I272 Other secondary pulmonary hypertension: Secondary | ICD-10-CM | POA: Diagnosis not present

## 2016-06-06 DIAGNOSIS — R55 Syncope and collapse: Secondary | ICD-10-CM | POA: Diagnosis not present

## 2016-06-06 DIAGNOSIS — N189 Chronic kidney disease, unspecified: Secondary | ICD-10-CM | POA: Diagnosis not present

## 2016-06-06 DIAGNOSIS — K7030 Alcoholic cirrhosis of liver without ascites: Secondary | ICD-10-CM | POA: Diagnosis not present

## 2016-06-06 LAB — BASIC METABOLIC PANEL
Anion gap: 8 (ref 5–15)
BUN: 22 mg/dL — AB (ref 6–20)
CHLORIDE: 103 mmol/L (ref 101–111)
CO2: 24 mmol/L (ref 22–32)
CREATININE: 1.56 mg/dL — AB (ref 0.61–1.24)
Calcium: 9 mg/dL (ref 8.9–10.3)
GFR calc Af Amer: 52 mL/min — ABNORMAL LOW (ref >60)
GFR calc non Af Amer: 45 mL/min — ABNORMAL LOW (ref >60)
Glucose, Bld: 101 mg/dL — ABNORMAL HIGH (ref 65–99)
POTASSIUM: 4.2 mmol/L (ref 3.5–5.1)
Sodium: 135 mmol/L (ref 135–145)

## 2016-06-06 LAB — CULTURE, GROUP A STREP (THRC)

## 2016-06-06 MED ORDER — FUROSEMIDE 40 MG PO TABS: 40.0000 mg | ORAL_TABLET | Freq: Every day | ORAL | 3 refills | Status: DC

## 2016-06-06 NOTE — Telephone Encounter (Signed)
See if we can find him a 15 minute spot after I return. Willing to do a 7:30 on a Friday or an 11:30 on a Mon or Fri

## 2016-06-06 NOTE — Progress Notes (Signed)
Advanced Heart Failure Rounding Note   Subjective:    Admitted with syncope.   Denies dizziness. Wants to go home.   On tele multiple episodes of tachycardia with rates up to 110. Looks like sinus   Objective:   Weight Range:  Vital Signs:   Temp:  [97.9 F (36.6 C)-98.3 F (36.8 C)] 98.1 F (36.7 C) (08/04 0523) Pulse Rate:  [68-87] 68 (08/04 0523) Resp:  [18] 18 (08/04 0523) BP: (115-132)/(74-90) 125/75 (08/04 0523) SpO2:  [98 %-100 %] 100 % (08/04 0523) Weight:  [187 lb 11.2 oz (85.1 kg)] 187 lb 11.2 oz (85.1 kg) (08/04 0523) Last BM Date: 06/04/16  Weight change: Filed Weights   06/04/16 1341 06/05/16 0600 06/06/16 0523  Weight: 191 lb 8 oz (86.9 kg) 188 lb 3.2 oz (85.4 kg) 187 lb 11.2 oz (85.1 kg)    Intake/Output:   Intake/Output Summary (Last 24 hours) at 06/06/16 0802 Last data filed at 06/06/16 0640  Gross per 24 hour  Intake             1380 ml  Output             1625 ml  Net             -245 ml     Physical Exam: General: No resp difficulty. Sitting on the side of the bed.  HEENT: normal Neck: supple. JVP 8  . Carotids 2+ bilat; no bruits. No lymphadenopathy or thryomegaly appreciated. Cor: PMI nondisplaced. Regular rate & rhythm. 2/6 TR P2 LLSB + RV lifts Lungs: clear Abdomen: soft, nontender, nondistended. No hepatosplenomegaly. No bruits or masses. Good bowel sounds. Extremities: no cyanosis, clubbing, rash, edema Neuro: alert & orientedx3, cranial nerves grossly intact. moves all 4 extremities w/o difficulty. Affect pleasant  Telemetry: Sinus Rhythm 60s. Episode on ST 100-110s  Labs: Basic Metabolic Panel:  Recent Labs Lab 06/04/16 0820 06/05/16 0314 06/06/16 0413  NA 133* 134* 135  K 3.8 3.8 4.2  CL 103 104 103  CO2 21* 21* 24  GLUCOSE 95 94 101*  BUN 24* 18 22*  CREATININE 1.48* 1.31* 1.56*  CALCIUM 8.8* 8.9 9.0    Liver Function Tests: No results for input(s): AST, ALT, ALKPHOS, BILITOT, PROT, ALBUMIN in the last 168  hours. No results for input(s): LIPASE, AMYLASE in the last 168 hours. No results for input(s): AMMONIA in the last 168 hours.  CBC:  Recent Labs Lab 06/04/16 0820 06/05/16 0314  WBC 2.1* 2.8*  HGB 12.5* 12.8*  HCT 35.6* 38.5*  MCV 103.5* 105.8*  PLT 51* 56*    Cardiac Enzymes:  Recent Labs Lab 06/04/16 0820  TROPONINI <0.03    BNP: BNP (last 3 results)  Recent Labs  11/13/15 1015 04/14/16 0149 06/04/16 0820  BNP 317.9* 543.9* 171.9*    ProBNP (last 3 results)  Recent Labs  04/28/16 1400  PROBNP 383.0*      Other results:  Imaging: Dg Chest 2 View  Result Date: 06/04/2016 CLINICAL DATA:  Cough and chest pain for the past week ; former smoker, cirrhosis with ascites, CHF, secondary pulmonary hypertension and right heart failure, chronic renal insufficiency EXAM: CHEST  2 VIEW COMPARISON:  PA and lateral chest x-ray of April 28, 2016 FINDINGS: The lungs are adequately inflated. There is no focal infiltrate. The interstitial markings are coarse though stable. The cardiac silhouette is enlarged. The pulmonary vascularity is normal. The mediastinum is normal in width. There is a small amount of calcification in  the wall of the aortic arch. There is no pleural effusion. The observed bony thorax is unremarkable. IMPRESSION: Mild stable cardiomegaly. No CHF, pneumonia, nor other acute cardiopulmonary abnormality. Aortic atherosclerosis. Electronically Signed   By: David  Martinique M.D.   On: 06/04/2016 09:03   Ct Head Wo Contrast  Result Date: 06/04/2016 CLINICAL DATA:  Syncope last night, hit left side of head on counter top. Headache. EXAM: CT HEAD WITHOUT CONTRAST TECHNIQUE: Contiguous axial images were obtained from the base of the skull through the vertex without intravenous contrast. COMPARISON:  03/01/2011 FINDINGS: No acute intracranial abnormality. Specifically, no hemorrhage, hydrocephalus, mass lesion, acute infarction, or significant intracranial injury. No acute  calvarial abnormality. Mucosal thickening in the sphenoid sinuses. No air-fluid levels. Mastoid air cells are clear. IMPRESSION: No acute intracranial abnormality. Chronic sphenoid sinusitis. Electronically Signed   By: Rolm Baptise M.D.   On: 06/04/2016 10:26      Medications:     Scheduled Medications: . feeding supplement (ENSURE ENLIVE)  237 mL Oral BID BM  . fluticasone  2 spray Each Nare Daily  . folic acid  1 mg Oral Daily  . furosemide  40 mg Oral Daily  . hyoscyamine  0.375 mg Oral Daily  . levothyroxine  25 mcg Oral QAC breakfast  . Macitentan  10 mg Oral Daily  . mometasone-formoterol  2 puff Inhalation Daily  . multivitamin with minerals  1 tablet Oral Daily  . pantoprazole  40 mg Oral Daily  . potassium chloride  20 mEq Oral Daily  . Selexipag  1,000 mcg Oral q morning - 10a  . Selexipag  800 mcg Oral QPM  . sodium chloride flush  3 mL Intravenous Q12H  . spironolactone  50 mg Oral Daily  . tadalafil (PAH)  40 mg Oral Daily  . thiamine  100 mg Oral Daily  . verapamil  180 mg Oral QHS     Infusions:     PRN Medications:  acetaminophen **OR** acetaminophen, albuterol, benzonatate, bisacodyl, doxepin, hydrALAZINE, HYDROcodone-acetaminophen, LORazepam **OR** LORazepam, ondansetron **OR** ondansetron (ZOFRAN) IV   Assessment:  1. Syncope 2. PAH - Suspected portopulmonary HTN 3. CKD stage III 4. Cirrhosis 5. HTN 6. OSA 7. Pancytopenia    Plan/Discussion:    Doing ok. Episodes of Sinus Tach with ambulation.   Volume status low. Cut back lasix to Monday and Friday.   Continue current PAH meds.   Ok to go home.     Length of Stay: 0   Amy Clegg NP-C  06/06/2016, 8:02 AM  Advanced Heart Failure Team Pager [phone] (M-F; 7a - 4p)  Please contact [facility] for night-coverage after hours (4p -7a ) and weekends on [email or web address]  Patient seen and examined with Darrick Grinder, NP. We discussed all aspects of the encounter. I agree with the assessment and  plan as stated above.   No further syncope. On tele multiple bursts of tachycardia that look like sinus tach. We walked him down hall personally and episodes clearly sinus tach. Suspect due to RV failure. Will cut back lasix to Monday and Firday only. Stop verapamil. Continue therapy for PAH. Can go home today.   Evona Westra,MD 8:32 AM

## 2016-06-06 NOTE — Progress Notes (Signed)
Discharge instructions reviewed with the patient to include follow-up appointments. Discharge medications reviewed to include next dose times and medicationchanges.  Printed copies provided.  Patient voices understanding to teaching.  Ambulatory to door.  Home via [place] with his son driving.

## 2016-06-06 NOTE — Discharge Summary (Signed)
Physician Discharge Summary  Darren Mueller ZOX:[id number] DOB: 1950-11-20 DOA: 06/04/2016  PCP: Danise Edge, MD  Admit date: 06/04/2016 Discharge date: 06/06/2016  Admitted From: home Disposition:  Home  Recommendations for Outpatient Follow-up:  1. Follow up with PCP in 1-2 weeks 2. Please obtain BMP/CBC in one week   Home Health:No Equipment/Devices:no Discharge Condition:*stable CODE STATUS:FULL Diet recommendation: Heart Healthy    Brief/Interim Summary:  65 year old male with a history of alcoholic liver cirrhosis, seizure disorder, GERD, hypertension, pulmonary arterial hypertension, and diastolic CHF presented to [facility] for persistent cough and chest pain. Notably, the patient was seen in HF clinic on 05/01/2016 and was treated with antibiotics for bronchitis. The patient gives a history of a syncopal episode on 06/03/2016 after a prolonged episode of staining at the grill. This was preceded by dizziness without any chest discomfort or shortness of breath. In fact, the patient had been complaining of dizziness with positional changes over the last 3-4 days prior to admission. He complained of some right-sided chest discomfort, but attributed this to his chronic cough. The patient was oriented after his syncopal episode, but thought he might have hit the right side of his head. At the time of admission, CT of brain was negative. Orthostatics were positive.The patient was seen by cardiology. His furosemide was decreased to 40 mg daily on Monday and Fridays only. His verapamil was discontinued.  Discharge Diagnoses:  Syncope -Likely vagal in etiology with positive orthostatics--blood pressure 125/76 sitting; 107/77 standing -04/15/2016 echo EF 55-60 percent, grade 1 daily, no WMA, severe dilated RV -05/15/2016  heart catheterization--minimal CAD -appreciate cardiology evaluation-->cut back lasix to [facility] & Fridays only and d/c verapamil -06/04/2016 CT brain  negative  Right-sided chest pain -sinus with T-wave inversion V1-V3, III, aVF -no CP presently -05/15/2016  heart catheterization--minimal CAD -CXR neg  Chronic systolic heart failure / pulmonary HTN. -clinically euvolemic -echo as above -No edema on CXR -Continue home diuretics with changes above, pulmonary HTN meds.  -monitor wt, intake + output  Cirrhosis complicated by portal hypertension.  -compensated -follows [facility]  CKD stage III -Baseline creatinine of 1.3-1.5 -Serum creatinine 1.56 on day of discharge  HTN -continue furosemide and aldactone -Furosemide decreased to 40 mg daily on Monday and Friday -d/c verapamil per Dr. Gala Romney   COPD -Compensated -Recently completed doxycycline  OSA, -can't tolerate cpap  Hypothyroidism -continue home synthroid  GERD, asymptomatic. -continue home PPI   Discharge Instructions     Medication List    STOP taking these medications   verapamil 180 MG 24 hr capsule Commonly known as:  VERELAN PM     TAKE these medications   albuterol 108 (90 Base) MCG/ACT inhaler Commonly known as:  PROVENTIL HFA;VENTOLIN HFA Inhale 2 puffs into the lungs every 6 (six) hours as needed for wheezing or shortness of breath.   ANTI-DIARRHEAL 2 MG capsule Generic drug:  loperamide Take 4 mg by mouth as needed for diarrhea or loose stools (up to 8 capsules per day).   benzonatate 100 MG capsule Commonly known as:  TESSALON Take 1 capsule (100 mg total) by mouth 3 (three) times daily as needed.   doxepin 10 MG capsule Commonly known as:  SINEQUAN TAKE 1 TO 2 CAPSULES AT BEDTIME   Fluticasone Propionate 0.05 % Lotn Apply daily to affected area What changed:  how much to take  how to take this  when to take this  additional instructions   furosemide 40 MG tablet Commonly known as:  LASIX Take 1 tablet (40 mg total) by mouth daily. Take as directed on Monday and Friday What changed:  additional  instructions   levothyroxine 25 MCG tablet Commonly known as:  SYNTHROID, LEVOTHROID Take 1 tablet (25 mcg total) by mouth daily.   Macitentan 10 MG Tabs Commonly known as:  OPSUMIT Take 10 mg by mouth daily.   mometasone-formoterol 100-5 MCG/ACT Aero Commonly known as:  DULERA Inhale 2 puffs into the lungs daily.   omeprazole 40 MG capsule Commonly known as:  PRILOSEC Take 1 capsule (40 mg total) by mouth daily.   Potassium Chloride ER 20 MEQ Tbcr Take 20 mEq by mouth daily.   Selexipag 800 MCG Tabs Commonly known as:  UPTRAVI Take 1 tablet (800 mcg total) by mouth every evening.   Selexipag 1000 MCG Tabs Commonly known as:  UPTRAVI Take 1,000 mg by mouth every morning.   spironolactone 50 MG tablet Commonly known as:  ALDACTONE TAKE 1 TABLET DAILY   SYMAX-SR 0.375 MG 12 hr tablet Generic drug:  hyoscyamine Take 0.375 mg by mouth daily.   tadalafil (PAH) 20 MG tablet Commonly known as:  ADCIRCA TAKE 2 TABLETS (40 MG TOTAL) DAILY       Allergies  Allergen Reactions  . Aspirin Other (See Comments)    hypertension    Consultations:  Cardiology   Procedures/Studies: Dg Chest 2 View  Result Date: 06/04/2016 CLINICAL DATA:  Cough and chest pain for the past week ; former smoker, cirrhosis with ascites, CHF, secondary pulmonary hypertension and right heart failure, chronic renal insufficiency EXAM: CHEST  2 VIEW COMPARISON:  PA and lateral chest x-ray of April 28, 2016 FINDINGS: The lungs are adequately inflated. There is no focal infiltrate. The interstitial markings are coarse though stable. The cardiac silhouette is enlarged. The pulmonary vascularity is normal. The mediastinum is normal in width. There is a small amount of calcification in the wall of the aortic arch. There is no pleural effusion. The observed bony thorax is unremarkable. IMPRESSION: Mild stable cardiomegaly. No CHF, pneumonia, nor other acute cardiopulmonary abnormality. Aortic atherosclerosis.  Electronically Signed   By: Rebakah Cokley  [place] M.D.   On: 06/04/2016 09:03   Ct Head Wo Contrast  Result Date: 06/04/2016 CLINICAL DATA:  Syncope last night, hit left side of head on counter top. Headache. EXAM: CT HEAD WITHOUT CONTRAST TECHNIQUE: Contiguous axial images were obtained from the base of the skull through the vertex without intravenous contrast. COMPARISON:  03/01/2011 FINDINGS: No acute intracranial abnormality. Specifically, no hemorrhage, hydrocephalus, mass lesion, acute infarction, or significant intracranial injury. No acute calvarial abnormality. Mucosal thickening in the sphenoid sinuses. No air-fluid levels. Mastoid air cells are clear. IMPRESSION: No acute intracranial abnormality. Chronic sphenoid sinusitis. Electronically Signed   By: Charlett Nose M.D.   On: 06/04/2016 10:26         Discharge Exam: Vitals:   06/05/16 2136 06/06/16 0523  BP: 132/81 125/75  Pulse: 81 68  Resp: 18 18  Temp: 97.9 F (36.6 C) 98.1 F (36.7 C)   Vitals:   06/05/16 0935 06/05/16 1003 06/05/16 2136 06/06/16 0523  BP: 122/90  132/81 125/75  Pulse: 87  81 68  Resp: 18  18 18   Temp: 98 F (36.7 C)  97.9 F (36.6 C) 98.1 F (36.7 C)  TempSrc: Oral  Oral Oral  SpO2: 98% 98% 100% 100%  Weight:    85.1 kg (187 lb 11.2 oz)  Height:        General: Pt  is alert, awake, not in acute distress Cardiovascular: RRR, S1/S2 +, no rubs, no gallops Respiratory: CTA bilaterally, no wheezing, no rhonchi Abdominal: Soft, NT, ND, bowel sounds + Extremities: no edema, no cyanosis   The results of significant diagnostics from this hospitalization (including imaging, microbiology, ancillary and laboratory) are listed below for reference.    Significant Diagnostic Studies: Dg Chest 2 View  Result Date: 06/04/2016 CLINICAL DATA:  Cough and chest pain for the past week ; former smoker, cirrhosis with ascites, CHF, secondary pulmonary hypertension and right heart failure, chronic renal insufficiency  EXAM: CHEST  2 VIEW COMPARISON:  PA and lateral chest x-ray of April 28, 2016 FINDINGS: The lungs are adequately inflated. There is no focal infiltrate. The interstitial markings are coarse though stable. The cardiac silhouette is enlarged. The pulmonary vascularity is normal. The mediastinum is normal in width. There is a small amount of calcification in the wall of the aortic arch. There is no pleural effusion. The observed bony thorax is unremarkable. IMPRESSION: Mild stable cardiomegaly. No CHF, pneumonia, nor other acute cardiopulmonary abnormality. Aortic atherosclerosis. Electronically Signed   By: Jobie Popp  [place] M.D.   On: 06/04/2016 09:03   Ct Head Wo Contrast  Result Date: 06/04/2016 CLINICAL DATA:  Syncope last night, hit left side of head on counter top. Headache. EXAM: CT HEAD WITHOUT CONTRAST TECHNIQUE: Contiguous axial images were obtained from the base of the skull through the vertex without intravenous contrast. COMPARISON:  03/01/2011 FINDINGS: No acute intracranial abnormality. Specifically, no hemorrhage, hydrocephalus, mass lesion, acute infarction, or significant intracranial injury. No acute calvarial abnormality. Mucosal thickening in the sphenoid sinuses. No air-fluid levels. Mastoid air cells are clear. IMPRESSION: No acute intracranial abnormality. Chronic sphenoid sinusitis. Electronically Signed   By: Charlett Nose M.D.   On: 06/04/2016 10:26     Microbiology: Recent Results (from the past 240 hour(s))  Rapid strep screen     Status: None   Collection Time: 06/04/16  9:20 AM  Result Value Ref Range Status   Streptococcus, Group A Screen (Direct) NEGATIVE NEGATIVE Final    Comment: (NOTE) A Rapid Antigen test may result negative if the antigen level in the sample is below the detection level of this test. The FDA has not cleared this test as a stand-alone test therefore the rapid antigen negative result has reflexed to a Group A Strep culture.   Culture, group A strep      Status: None (Preliminary result)   Collection Time: 06/04/16  9:20 AM  Result Value Ref Range Status   Specimen Description THROAT  Final   Special Requests NONE Reflexed from [id number]  Final   Culture   Final    CULTURE REINCUBATED FOR BETTER GROWTH Performed at [facility]    Report Status PENDING  Incomplete     Labs: Basic Metabolic Panel:  Recent Labs Lab 06/04/16 0820 06/05/16 0314 06/06/16 0413  NA 133* 134* 135  K 3.8 3.8 4.2  CL 103 104 103  CO2 21* 21* 24  GLUCOSE 95 94 101*  BUN 24* 18 22*  CREATININE 1.48* 1.31* 1.56*  CALCIUM 8.8* 8.9 9.0   Liver Function Tests: No results for input(s): AST, ALT, ALKPHOS, BILITOT, PROT, ALBUMIN in the last 168 hours. No results for input(s): LIPASE, AMYLASE in the last 168 hours. No results for input(s): AMMONIA in the last 168 hours. CBC:  Recent Labs Lab 06/04/16 0820 06/05/16 0314  WBC 2.1* 2.8*  HGB 12.5* 12.8*  HCT 35.6* 38.5*  MCV 103.5* 105.8*  PLT 51* 56*   Cardiac Enzymes:  Recent Labs Lab 06/04/16 0820  TROPONINI <0.03   BNP: Invalid input(s): POCBNP CBG: No results for input(s): GLUCAP in the last 168 hours.  Time coordinating discharge:  Greater than 30 minutes  Signed:  Cris Talavera, DO [facility] Pager: [phone] 06/06/2016, 10:45 AM

## 2016-06-06 NOTE — Telephone Encounter (Signed)
PCP: Penni Homans, MD  Admit date: 06/04/2016 Discharge date: 06/06/2016  Admitted From: home Disposition:  Home  Recommendations for Outpatient Follow-up:  1. Follow up with PCP in 1-2 weeks 2. Please obtain BMP/CBC in one week   Home Health:No Equipment/Devices:no Discharge [facility] follow up appt scheduled:  06/16/16 with Dr. Lorelei Pont at 11:30, however pt only wants to see Dr. Charlett Blake.      Transition Care Management Follow-up Telephone Call  How have you been since you were released from the hospital?   Pt walked into the clinic requesting medication for sore throat (it stings when I swallow) and post nasal drip.  He was discharged from the hospital today.   He has no other complaints other than the above.  Son mentioned that he seemed short of breath, pt states he was coughing because the post nasal drip.  No acute distress noted.  Pt has loratadine and robitussin DM on hand and was advised to take them until further directed by provider.     Please advise.     Do you understand why you were in the hospital? yes   Do you understand the discharge instructions? yes  Items Reviewed:  Medications reviewed: yes  Allergies reviewed: yes  Dietary changes reviewed: Heart Healthy Diet  Referrals reviewed: n/a   Functional Questionnaire:   Activities of Daily Living (ADLs):   He states they are independent in the following: ambulation, bathing and hygiene, feeding, continence, grooming, toileting and dressing States they require assistance with the following: none, but son is available to assist as needed   Any transportation issues/concerns?: no   Any patient concerns? See above   Confirmed importance and date/time of follow-up visits scheduled: yes   Confirmed with patient if condition begins to worsen call PCP or go to the ER: yes

## 2016-06-06 NOTE — Telephone Encounter (Signed)
Pt came in to schedule his Hospital F/U appt. Pt is currently scheduled with a different provider than his PCP. Pt would rather  have FU with his primary.   Please advise for scheduling.

## 2016-06-06 NOTE — Telephone Encounter (Signed)
Thank you Dr. Charlett Blake.  Message routed to scheduler.  Should patient continue Claritin and Robitussin DM until he follows up with you.  Please advise.

## 2016-06-06 NOTE — Telephone Encounter (Signed)
Pt called in because he says that he is still experiencing some drainage in his throat. Pt would like to know if pcp could call him in something to the pharmacy?    CB: [phone]   [facility] possible.

## 2016-06-09 NOTE — Telephone Encounter (Signed)
Continue Claritin and can use Robitussin DM prn

## 2016-06-10 NOTE — Telephone Encounter (Signed)
Pt notified of PCP recommendation and verbalized understanding.

## 2016-06-10 NOTE — Telephone Encounter (Signed)
appt has been scheduled for 8/14 at 10:30

## 2016-06-11 NOTE — Telephone Encounter (Signed)
Pt has been scheduled.  °

## 2016-06-16 ENCOUNTER — Inpatient Hospital Stay: Payer: Medicare Other | Admitting: Family Medicine

## 2016-06-16 ENCOUNTER — Telehealth: Payer: Self-pay | Admitting: *Deleted

## 2016-06-16 ENCOUNTER — Ambulatory Visit (INDEPENDENT_AMBULATORY_CARE_PROVIDER_SITE_OTHER): Payer: Medicare Other | Admitting: Family Medicine

## 2016-06-16 ENCOUNTER — Encounter: Payer: Self-pay | Admitting: Family Medicine

## 2016-06-16 VITALS — BP 108/62 | HR 102 | Temp 97.8°F | Ht 72.0 in | Wt 201.1 lb

## 2016-06-16 DIAGNOSIS — I509 Heart failure, unspecified: Secondary | ICD-10-CM | POA: Diagnosis not present

## 2016-06-16 DIAGNOSIS — R1013 Epigastric pain: Secondary | ICD-10-CM | POA: Diagnosis not present

## 2016-06-16 DIAGNOSIS — R55 Syncope and collapse: Secondary | ICD-10-CM

## 2016-06-16 DIAGNOSIS — I10 Essential (primary) hypertension: Secondary | ICD-10-CM

## 2016-06-16 DIAGNOSIS — E038 Other specified hypothyroidism: Secondary | ICD-10-CM

## 2016-06-16 DIAGNOSIS — F1029 Alcohol dependence with unspecified alcohol-induced disorder: Secondary | ICD-10-CM | POA: Diagnosis not present

## 2016-06-16 DIAGNOSIS — D696 Thrombocytopenia, unspecified: Secondary | ICD-10-CM

## 2016-06-16 LAB — CBC WITH DIFFERENTIAL/PLATELET
BASOS ABS: 0 10*3/uL (ref 0.0–0.1)
BASOS PCT: 0.8 % (ref 0.0–3.0)
EOS ABS: 0 10*3/uL (ref 0.0–0.7)
Eosinophils Relative: 1.5 % (ref 0.0–5.0)
HEMATOCRIT: 35.8 % — AB (ref 39.0–52.0)
Hemoglobin: 12.2 g/dL — ABNORMAL LOW (ref 13.0–17.0)
LYMPHS ABS: 0.6 10*3/uL — AB (ref 0.7–4.0)
LYMPHS PCT: 30 % (ref 12.0–46.0)
MCHC: 33.9 g/dL (ref 30.0–36.0)
MCV: 104.9 fl — ABNORMAL HIGH (ref 78.0–100.0)
Monocytes Absolute: 0.2 10*3/uL (ref 0.1–1.0)
Monocytes Relative: 10.9 % (ref 3.0–12.0)
NEUTROS ABS: 1.1 10*3/uL — AB (ref 1.4–7.7)
NEUTROS PCT: 56.8 % (ref 43.0–77.0)
RBC: 3.42 Mil/uL — ABNORMAL LOW (ref 4.22–5.81)
RDW: 15.7 % — AB (ref 11.5–15.5)
WBC: 1.9 10*3/uL — CL (ref 4.0–10.5)

## 2016-06-16 LAB — COMPREHENSIVE METABOLIC PANEL
ALT: 30 U/L (ref 0–53)
AST: 79 U/L — AB (ref 0–37)
Albumin: 3.8 g/dL (ref 3.5–5.2)
Alkaline Phosphatase: 224 U/L — ABNORMAL HIGH (ref 39–117)
BUN: 25 mg/dL — ABNORMAL HIGH (ref 6–23)
CALCIUM: 8.9 mg/dL (ref 8.4–10.5)
CHLORIDE: 108 meq/L (ref 96–112)
CO2: 21 meq/L (ref 19–32)
CREATININE: 1.55 mg/dL — AB (ref 0.40–1.50)
GFR: 57.92 mL/min — AB (ref >60.00)
GLUCOSE: 105 mg/dL — AB (ref 70–99)
Potassium: 3.7 mEq/L (ref 3.5–5.1)
Sodium: 137 mEq/L (ref 135–145)
Total Bilirubin: 0.8 mg/dL (ref 0.2–1.2)
Total Protein: 7.3 g/dL (ref 6.0–8.3)

## 2016-06-16 LAB — H. PYLORI ANTIBODY, IGG: H Pylori IgG: POSITIVE — AB

## 2016-06-16 NOTE — Progress Notes (Signed)
Pre visit review using our clinic review tool, if applicable. No additional management support is needed unless otherwise documented below in the visit note. 

## 2016-06-16 NOTE — Assessment & Plan Note (Signed)
Has an appt with cardiology later this week

## 2016-06-16 NOTE — Patient Instructions (Signed)
[employer], probiotic 10 strain cap daily, [employer] [facility]  Sinusitis, Adult Sinusitis is redness, soreness, and inflammation of the paranasal sinuses. Paranasal sinuses are air pockets within the bones of your face. They are located beneath your eyes, in the middle of your forehead, and above your eyes. In healthy paranasal sinuses, mucus is able to drain out, and air is able to circulate through them by way of your nose. However, when your paranasal sinuses are inflamed, mucus and air can become trapped. This can allow bacteria and other germs to grow and cause infection. Sinusitis can develop quickly and last only a short time (acute) or continue over a long period (chronic). Sinusitis that lasts for more than 12 weeks is considered chronic. CAUSES Causes of sinusitis include:  Allergies.  Structural abnormalities, such as displacement of the cartilage that separates your nostrils (deviated septum), which can decrease the air flow through your nose and sinuses and affect sinus drainage.  Functional abnormalities, such as when the small hairs (cilia) that line your sinuses and help remove mucus do not work properly or are not present. SIGNS AND SYMPTOMS Symptoms of acute and chronic sinusitis are the same. The primary symptoms are pain and pressure around the affected sinuses. Other symptoms include:  Upper toothache.  Earache.  Headache.  Bad breath.  Decreased sense of smell and taste.  A cough, which worsens when you are lying flat.  Fatigue.  Fever.  Thick drainage from your nose, which often is green and may contain pus (purulent).  Swelling and warmth over the affected sinuses. DIAGNOSIS Your health care provider will perform a physical exam. During your exam, your health care provider may perform any of the following to help determine if you have acute sinusitis or chronic sinusitis:  Look in your nose for signs of abnormal growths in your nostrils (nasal  polyps).  Tap over the affected sinus to check for signs of infection.  View the inside of your sinuses using an imaging device that has a light attached (endoscope). If your health care provider suspects that you have chronic sinusitis, one or more of the following tests may be recommended:  Allergy tests.  Nasal culture. A sample of mucus is taken from your nose, sent to a lab, and screened for bacteria.  Nasal cytology. A sample of mucus is taken from your nose and examined by your health care provider to determine if your sinusitis is related to an allergy. TREATMENT Most cases of acute sinusitis are related to a viral infection and will resolve on their own within 10 days. Sometimes, medicines are prescribed to help relieve symptoms of both acute and chronic sinusitis. These may include pain medicines, decongestants, nasal steroid sprays, or saline sprays. However, for sinusitis related to a bacterial infection, your health care provider will prescribe antibiotic medicines. These are medicines that will help kill the bacteria causing the infection. Rarely, sinusitis is caused by a fungal infection. In these cases, your health care provider will prescribe antifungal medicine. For some cases of chronic sinusitis, surgery is needed. Generally, these are cases in which sinusitis recurs more than 3 times per year, despite other treatments. HOME CARE INSTRUCTIONS  Drink plenty of water. Water helps thin the mucus so your sinuses can drain more easily.  Use a humidifier.  Inhale steam 3-4 times a day (for example, sit in the bathroom with the shower running).  Apply a warm, moist washcloth to your face 3-4 times a day, or as directed  by your health care provider.  Use saline nasal sprays to help moisten and clean your sinuses.  Take medicines only as directed by your health care provider.  If you were prescribed either an antibiotic or antifungal medicine, finish it all even if you start  to feel better. SEEK IMMEDIATE MEDICAL CARE IF:  You have increasing pain or severe headaches.  You have nausea, vomiting, or drowsiness.  You have swelling around your face.  You have vision problems.  You have a stiff neck.  You have difficulty breathing.   This information is not intended to replace advice given to you by your health care provider. Make sure you discuss any questions you have with your health care provider.   Document Released: 10/20/2005 Document Revised: 11/10/2014 Document Reviewed: 11/04/2011 Elsevier Interactive Patient Education [employer].

## 2016-06-19 ENCOUNTER — Encounter (HOSPITAL_COMMUNITY): Payer: Self-pay | Admitting: Internal Medicine

## 2016-06-19 ENCOUNTER — Ambulatory Visit (HOSPITAL_COMMUNITY)
Admit: 2016-06-19 | Discharge: 2016-06-19 | Disposition: A | Discharge status: Home / Self Care | Payer: Medicare Other | Source: Ambulatory Visit | Attending: Internal Medicine | Admitting: Internal Medicine

## 2016-06-19 VITALS — BP 114/66 | HR 83 | Wt 202.8 lb

## 2016-06-19 DIAGNOSIS — N189 Chronic kidney disease, unspecified: Secondary | ICD-10-CM | POA: Insufficient documentation

## 2016-06-19 DIAGNOSIS — I2721 Secondary pulmonary arterial hypertension: Secondary | ICD-10-CM

## 2016-06-19 DIAGNOSIS — J449 Chronic obstructive pulmonary disease, unspecified: Secondary | ICD-10-CM | POA: Insufficient documentation

## 2016-06-19 DIAGNOSIS — D61818 Other pancytopenia: Secondary | ICD-10-CM | POA: Diagnosis not present

## 2016-06-19 DIAGNOSIS — R55 Syncope and collapse: Secondary | ICD-10-CM | POA: Diagnosis not present

## 2016-06-19 DIAGNOSIS — I129 Hypertensive chronic kidney disease with stage 1 through stage 4 chronic kidney disease, or unspecified chronic kidney disease: Secondary | ICD-10-CM | POA: Diagnosis not present

## 2016-06-19 DIAGNOSIS — K7030 Alcoholic cirrhosis of liver without ascites: Secondary | ICD-10-CM

## 2016-06-19 DIAGNOSIS — I272 Other secondary pulmonary hypertension: Secondary | ICD-10-CM | POA: Diagnosis not present

## 2016-06-19 DIAGNOSIS — G4733 Obstructive sleep apnea (adult) (pediatric): Secondary | ICD-10-CM | POA: Diagnosis not present

## 2016-06-19 DIAGNOSIS — Z79899 Other long term (current) drug therapy: Secondary | ICD-10-CM | POA: Diagnosis not present

## 2016-06-19 NOTE — Progress Notes (Signed)
6 min walk test completed, pt ambulated 1320 ft (402 m).  O2 sats remained greater than 90%.

## 2016-06-19 NOTE — Addendum Note (Signed)
Encounter addended by: Scarlette Calico, RN on: 06/19/2016 10:35 AM<BR>    Actions taken: Sign clinical note

## 2016-06-19 NOTE — Progress Notes (Signed)
ADVANCED HF CLINIC NOTE  Patient ID: Darren Mueller, male   DOB: Feb 13, 1951, 65 y.o.   MRN: [id number] GI : Dr Ardis Hughs PCP: Dr Charlett Blake  Subjective:    Darren Mueller ("Darren Mueller") is a 65 yo with history of COPD, portopulmonary hypertension with RV failure, and ETOH cirrhosis presents for followup of his PAH.  He returns today for regular PAH follow up. Since we last saw him underwent RHC. Pulmonary pressures higher since previous but cardiac improved. However PVR still very high and RAP 11. Had CT of chest. Only notable for cirrhosis and splenomegaly. Saw Dr. Ardis Hughs in GI who ordered hepatoma screening, liver u/s and EGD to screen for varices  Admitted in 6/17 with CP and SOB. Was diuresed. Troponins normal. Underwent R/L cath. Minimal CAD with moderate PAH and preserved cardiac output.   Admitted last week with syncope. Felt to be due to volume depletion and RHF. Lasix cut back to 2x/week and verapamil stopped.    Returns for post-hospital. Feels better. No dizziness or recurrent syncope. Drinking more fluids. Saw Dr. Charlett Blake yesterday and treated for sinusitis. Labs with worsening pancytopenia. Has appt with Dr. Alen Blew next week. No bleeding. Continues with selexipag at 800 qam/ 1000 qpm. Unable to tolerate any further uptitration with HAs and worsening flushing.  Remains on Macitentan and Adcirca. Weight at 202 at home. Up about 5-10 pounds from baseline.   6MW today 1320 feet    PAH meds 1) Macitnentan 10 2) Adcirca 10 3) Selexapeg  Studies:  ECHO 12/14 EF 55-60% Peak PA pressure 35. Severe RV dysfunction  ECHO 7/15 EF 60% RV moderately to severely dilated. Moderate HK RVSP 60m HG  ECHO 6/16 EF 60% RV moderately to severely dilated. Severe HK RVSP ~65 mm HG. D-shaped septum Echo 2/17 LVEF 60-65% RV massively dilated. Flat septum. Severe HK. Moderate TR RVSP ~65. IVC small. No effusion   Cath 6/17  Mid RCA lesion, 20% stenosed. Dist LAD lesion, 20% stenosed.  Ao = 107/70 (87) LV  = 106/3/11 RA = 4 RV = 74/11 PA = 74/26 (45) PCW = 6 Fick cardiac output/index = 4.6.2.2 PVR = 8.5 Ao sat = 95% PA sat = 61%, 58%  RHC 2/17 RA = 11 RV = 80/16/19 PA = 83/61 (71) PCW = 9 Fick cardiac output/index = 5.6/2.6 PVR = 10.3 WU Ao sat = 94% PA sat = 65%, 68% High SVC sat = 65%  RHC 2/14: RA = 13  RV = 63/5/14  PA = 65/24 (41)  PCW = 6  Hepatic wedge = 21  Fick cardiac output/index = 3.8/1.8  PVR = 9.1  FA sat = 94%  PA sat = 58%, 56%  SVC = 54%  RHC 06/06/14 RA = 5  RV = 73/5/9  PA = 81/25 (46)  PCW = 7  Fick cardiac output/index = 6.0/2.8  PVR = 6.5 Woods  FA sat = 95%  PA sat = 71%, 67%  Unable to get hepatic wedge  PFTs 3/14 FEV1 2.02 L (58%) FVC 2.7L (54%) FEV1/FVC (89%) DLCO 53%  PFTs 1/17 FEV1 2.05 L (61%) FVC 2.48L (56%) DLCO 44%   Event Monitor - NSR 10/26/13.   Ab u/s 8/16: + cirrhosis/mild to moderate splenomegaly  6 min walk 01/10/13, 1290 feet  6MW (01/10/14) = 1280 feet (380 m) 6MW (9/15) = 1350 feet (411 m) O2 sats ranged from 89-95% on room air, HR ranged from 100-129. 6MW (6/16) = 384 meters  6MW (2/17) = 1250 feet (  34m 6MW (8/17) = 1320 feet   Labs:  10/19/13 Creatinine 1.79 K 4.1  3/15        Cr 1.78 K 4.2 03/17/14    CR 1.23 K 4.0 LFTs ok 06/06/14 K 4.0 Creatinine 1.4  08/08/14 K 4.0 Cr 1.6 09/12/14 K 4.3 Cr 1.8 LFTs ok, HCT 58 12/15 K 4.3 Cr 1.15  06/2015 K 3.6, Cr 1.5 1/17  K 3.7 Cr 1.29 AST 75 ALT 33 Albumin 4.0 Bili 1.4 Wbc 2.2 hgb 13.5 PLT 61k 04/28/16  K 4.3 Cr 1.39 Hgb 12.9 PLT 44 BNP 383   Current Outpatient Prescriptions on File Prior to Encounter  Medication Sig Dispense Refill  . albuterol (PROVENTIL HFA;VENTOLIN HFA) 108 (90 BASE) MCG/ACT inhaler Inhale 2 puffs into Darren lungs every 6 (six) hours as needed for wheezing or shortness of breath.    . benzonatate (TESSALON) 100 MG capsule Take 1 capsule (100 mg total) by mouth 3 (three) times daily as needed. 21 capsule 0  . doxepin (SINEQUAN) 10 MG capsule  TAKE 1 TO 2 CAPSULES AT BEDTIME 180 capsule 1  . Fluticasone Propionate 0.05 % LOTN Apply daily to affected area (Patient taking differently: Apply 1 application topically daily. Apply daily to affected area) 3 Bottle 3  . furosemide (LASIX) 40 MG tablet Take 1 tablet (40 mg total) by mouth daily. Take as directed on Monday and Friday 30 tablet 3  . hyoscyamine (SYMAX-SR) 0.375 MG 12 hr tablet Take 0.375 mg by mouth daily.    .Marland Kitchenlevothyroxine (SYNTHROID, LEVOTHROID) 25 MCG tablet Take 1 tablet (25 mcg total) by mouth daily. 90 tablet 1  . loperamide (ANTI-DIARRHEAL) 2 MG capsule Take 4 mg by mouth as needed for diarrhea or loose stools (up to 8 capsules per day).     . Macitentan (OPSUMIT) 10 MG TABS Take 10 mg by mouth daily. 90 tablet 3  . mometasone-formoterol (DULERA) 100-5 MCG/ACT AERO Inhale 2 puffs into Darren lungs daily.     .Marland Kitchenomeprazole (PRILOSEC) 40 MG capsule Take 1 capsule (40 mg total) by mouth daily. 90 capsule 2  . potassium chloride 20 MEQ TBCR Take 20 mEq by mouth daily. 30 tablet 3  . Selexipag (UPTRAVI) 1000 MCG TABS Take 1,000 mg by mouth every morning. 90 tablet 2  . Selexipag (UPTRAVI) 800 MCG TABS Take 1 tablet (800 mcg total) by mouth every evening. 90 tablet 2  . spironolactone (ALDACTONE) 50 MG tablet TAKE 1 TABLET DAILY 90 tablet 0  . Tadalafil, PAH, (ADCIRCA) 20 MG TABS TAKE 2 TABLETS (40 MG TOTAL) DAILY 180 tablet 2   No current facility-administered medications on file prior to encounter.     Objective:   Weight Range:  Vital Signs:   Pulse Rate:  [83] 83 (08/17 0938) BP: (114)/(66) 114/66 (08/17 0938) SpO2:  [98 %] 98 % (08/17 0938) Weight:  [202 lb 12 oz (92 kg)] 202 lb 12 oz (92 kg) (08/17 0938)    Wt Readings from Last 3 Encounters:  06/19/16 202 lb 12 oz (92 kg)  06/16/16 201 lb 2 oz (91.2 kg)  06/06/16 187 lb 11.2 oz (85.1 kg)     Physical Exam: General: NAD, facial flushing.  Neck: JVP ~8-9  cm  no thyromegaly or thyroid nodule.  Lungs: CTAB,  normal effort. CV: Nondisplaced PMI. Heart regular  2/6 TR P2 LLSB. No carotid bruit.   Abdomen: Soft, NT, ND, no HSM. No bruits or masses. +BS  Ext: warm, No clubbing, 1+ edema.Normal  pedal pulses. Skin: Intact without lesions or rashes.  Neurologic: Alert and oriented x 3.  Psych: Normal affect.  Extremities: No clubbing or cyanosis.  HEENT: Normal. Except for erythematous rash/flushing   Assessment/Plan    1. Recent syncope -Likely mix of volume depletion and RV failure. Improved with decreasing lasix. Need to be careful that he doesn't overcompensate with fluid intake.  2. PAH: Suspected portopulmonary HTN. - Remains on triple therapy. Recent RHC with slightly improved hemodynamics  - Persistent NYHA III symptoms. 6MW stable - Continue macitentan 10 daily, adcirca 40 mg daily, selexipag 1000 qam/800 qpm unable to titrate further - Will continue current therapy. Not candidate for IV therapies at this point but will closely for decompensation.  -Discussed absolute need to stop ETOH completely.  - Volume status a bit up. Can take extra lasix as needed. Keep weight 198-202 3. CKD:  - Stable on recent labs 4. Cirrhosis:  - Likely combination of RV failure an ETOH - As above, again recommended complete cessation of ETOH.  - Saw Dr. Ardis Hughs and further testing pending. - Albumin 4.0 but AST remains elevated and platelets low. - No evidence of hepatic encephalopath5 5. HTN:  - Stable on current regimen.  6. OSA:  -Has mild OSA with AHI 12 and desats down to 82%.  - Intolerant CPAP.  - Have encouraged to use a pulse ox and use 02 prn to keep sats > 90% 7. Pancytopenia:  - Progressive. Most likely due to cirrhosis vs bone marrow suppression. Can also be seen with PAH meds. Has seen Dr. Marin Olp in Hematology as well as Dr. Alen Blew. Will see him again next week.     Bensimhon, Daniel,MD 10:21 AM

## 2016-06-19 NOTE — Patient Instructions (Signed)
We will contact you in 2-3 months to schedule your next appointment.

## 2016-06-20 ENCOUNTER — Ambulatory Visit (INDEPENDENT_AMBULATORY_CARE_PROVIDER_SITE_OTHER): Payer: Medicare Other | Admitting: Family Medicine

## 2016-06-20 ENCOUNTER — Encounter: Payer: Self-pay | Admitting: Family Medicine

## 2016-06-20 ENCOUNTER — Telehealth: Payer: Self-pay | Admitting: *Deleted

## 2016-06-20 VITALS — BP 128/68 | HR 82 | Temp 97.8°F | Resp 17 | Ht 74.0 in | Wt 203.0 lb

## 2016-06-20 DIAGNOSIS — E785 Hyperlipidemia, unspecified: Secondary | ICD-10-CM | POA: Diagnosis not present

## 2016-06-20 DIAGNOSIS — Z0000 Encounter for general adult medical examination without abnormal findings: Secondary | ICD-10-CM

## 2016-06-20 DIAGNOSIS — I509 Heart failure, unspecified: Secondary | ICD-10-CM

## 2016-06-20 DIAGNOSIS — K7030 Alcoholic cirrhosis of liver without ascites: Secondary | ICD-10-CM

## 2016-06-20 DIAGNOSIS — N183 Chronic kidney disease, stage 3 unspecified: Secondary | ICD-10-CM

## 2016-06-20 DIAGNOSIS — I50810 Right heart failure, unspecified: Secondary | ICD-10-CM

## 2016-06-20 DIAGNOSIS — I10 Essential (primary) hypertension: Secondary | ICD-10-CM | POA: Diagnosis not present

## 2016-06-20 DIAGNOSIS — G4700 Insomnia, unspecified: Secondary | ICD-10-CM

## 2016-06-20 DIAGNOSIS — R490 Dysphonia: Secondary | ICD-10-CM

## 2016-06-20 DIAGNOSIS — D61818 Other pancytopenia: Secondary | ICD-10-CM

## 2016-06-20 DIAGNOSIS — N189 Chronic kidney disease, unspecified: Secondary | ICD-10-CM | POA: Diagnosis not present

## 2016-06-20 DIAGNOSIS — R55 Syncope and collapse: Secondary | ICD-10-CM

## 2016-06-20 DIAGNOSIS — R0789 Other chest pain: Secondary | ICD-10-CM

## 2016-06-20 DIAGNOSIS — K219 Gastro-esophageal reflux disease without esophagitis: Secondary | ICD-10-CM

## 2016-06-20 DIAGNOSIS — E038 Other specified hypothyroidism: Secondary | ICD-10-CM

## 2016-06-20 DIAGNOSIS — D696 Thrombocytopenia, unspecified: Secondary | ICD-10-CM

## 2016-06-20 DIAGNOSIS — I2729 Other secondary pulmonary hypertension: Secondary | ICD-10-CM

## 2016-06-20 LAB — COMPREHENSIVE METABOLIC PANEL
ALBUMIN: 3.8 g/dL (ref 3.5–5.2)
ALT: 24 U/L (ref 0–53)
AST: 81 U/L — ABNORMAL HIGH (ref 0–37)
Alkaline Phosphatase: 208 U/L — ABNORMAL HIGH (ref 39–117)
BUN: 32 mg/dL — ABNORMAL HIGH (ref 6–23)
CALCIUM: 8.9 mg/dL (ref 8.4–10.5)
CHLORIDE: 111 meq/L (ref 96–112)
CO2: 20 mEq/L (ref 19–32)
CREATININE: 1.6 mg/dL — AB (ref 0.40–1.50)
GFR: 55.83 mL/min — AB (ref >60.00)
Glucose, Bld: 95 mg/dL (ref 70–99)
POTASSIUM: 4.2 meq/L (ref 3.5–5.1)
Sodium: 138 mEq/L (ref 135–145)
Total Bilirubin: 0.8 mg/dL (ref 0.2–1.2)
Total Protein: 7 g/dL (ref 6.0–8.3)

## 2016-06-20 LAB — CBC
HCT: 32.5 % — ABNORMAL LOW (ref 39.0–52.0)
HEMOGLOBIN: 11.1 g/dL — AB (ref 13.0–17.0)
MCHC: 34.3 g/dL (ref 30.0–36.0)
MCV: 105.4 fl — ABNORMAL HIGH (ref 78.0–100.0)
RBC: 3.09 Mil/uL — AB (ref 4.22–5.81)
RDW: 15.7 % — ABNORMAL HIGH (ref 11.5–15.5)
WBC: 1.4 10*3/uL — CL (ref 4.0–10.5)

## 2016-06-20 LAB — LIPID PANEL
CHOL/HDL RATIO: 3
Cholesterol: 198 mg/dL (ref 0–200)
HDL: 62.4 mg/dL (ref >39.00)
TRIGLYCERIDES: 485 mg/dL — AB (ref 0.0–149.0)

## 2016-06-20 LAB — TSH: TSH: 3.47 u[IU]/mL (ref 0.35–4.50)

## 2016-06-20 LAB — LDL CHOLESTEROL, DIRECT: LDL DIRECT: 68 mg/dL

## 2016-06-20 NOTE — Patient Instructions (Signed)

## 2016-06-20 NOTE — Telephone Encounter (Signed)
Patient's hematologist aware. No new concerns. Patient asymptomatic and aware of what symptoms to seek care.

## 2016-06-20 NOTE — Progress Notes (Signed)
Pre visit review using our clinic review tool, if applicable. No additional management support is needed unless otherwise documented below in the visit note. 

## 2016-06-25 ENCOUNTER — Ambulatory Visit (HOSPITAL_BASED_OUTPATIENT_CLINIC_OR_DEPARTMENT_OTHER): Payer: Medicare Other | Admitting: Oncology

## 2016-06-25 ENCOUNTER — Other Ambulatory Visit: Payer: Medicare Other

## 2016-06-25 ENCOUNTER — Telehealth: Payer: Self-pay | Admitting: Oncology

## 2016-06-25 VITALS — BP 130/79 | HR 85 | Temp 97.9°F | Resp 18 | Ht 74.0 in | Wt 204.8 lb

## 2016-06-25 DIAGNOSIS — K7469 Other cirrhosis of liver: Secondary | ICD-10-CM | POA: Diagnosis not present

## 2016-06-25 DIAGNOSIS — D72819 Decreased white blood cell count, unspecified: Secondary | ICD-10-CM | POA: Diagnosis not present

## 2016-06-25 DIAGNOSIS — D696 Thrombocytopenia, unspecified: Secondary | ICD-10-CM | POA: Diagnosis not present

## 2016-06-25 DIAGNOSIS — D731 Hypersplenism: Secondary | ICD-10-CM | POA: Diagnosis not present

## 2016-06-25 DIAGNOSIS — K766 Portal hypertension: Secondary | ICD-10-CM | POA: Diagnosis not present

## 2016-06-25 NOTE — Telephone Encounter (Signed)
Gave patient avs report and appointments for December

## 2016-06-25 NOTE — Progress Notes (Signed)
Hematology and Oncology Follow Up Visit  Darren Mueller [id number] December 08, 1950 65 y.o. 06/25/2016 9:45 AM Darren Mueller, MDBlyth, Darren Levan, MD   Principle Diagnosis: 65 year old gentleman with thrombocytopenia and a leukocytopenia related due to cirrhosis of the liver and splenic sequestration. These findings have been documented dating back to at least 2012.  Current therapy: Observation and surveillance.  Interim History:  Darren Mueller presents today for a follow-up visit. He is a nice gentleman I saw in consultation back in February 2017. Since his last visit, he was hospitalized in June 2017 and underwent a right heart catheterization. He tolerated the procedure well and did not show any major coronary artery disease. His pulmonary hypertension appears to have improved. He was hospitalized again and the report of August 2017 for syncope. And his workup was unrevealing.   He did have a repeat CBC on 06/20/2016 which showed a total white cell count of 1.4 and a platelet count of 42. On 06/16/2016 his white cell count was 1.9 with an absolute neutrophil count of 1100.    Since his discharge, he feels relatively fair and denies any bleeding. He denied any hematochezia, melena,  hemoptysis or epistaxis. His appetite remained reasonable and denied any recent infections. He denied any sinusitis, cellulitis or urinary tract infections.  He does not report any headaches, blurry vision, syncope or seizures. He does not report any fevers, chills or sweats. He does not report any cough, wheezing or hemoptysis. He does not report any chest pain, palpitation or orthopnea. Does not report any nausea, vomiting, abdominal pain, or early satiety. He does not report any constipation or diarrhea. He does not report any frequency urgency or hesitancy. He does not report any skeletal complaints. Remaining review of systems unremarkable.   Medications: I have reviewed the patient's current medications.  Current  Outpatient Prescriptions  Medication Sig Dispense Refill  . albuterol (PROVENTIL HFA;VENTOLIN HFA) 108 (90 BASE) MCG/ACT inhaler Inhale 2 puffs into the lungs every 6 (six) hours as needed for wheezing or shortness of breath.    . benzonatate (TESSALON) 100 MG capsule Take 1 capsule (100 mg total) by mouth 3 (three) times daily as needed. 21 capsule 0  . doxepin (SINEQUAN) 10 MG capsule TAKE 1 TO 2 CAPSULES AT BEDTIME 180 capsule 1  . Fluticasone Propionate 0.05 % LOTN Apply daily to affected area (Patient taking differently: Apply 1 application topically daily. Apply daily to affected area) 3 Bottle 3  . furosemide (LASIX) 40 MG tablet Take 1 tablet (40 mg total) by mouth daily. Take as directed on Monday and Friday 30 tablet 3  . hyoscyamine (SYMAX-SR) 0.375 MG 12 hr tablet Take 0.375 mg by mouth daily.    Marland Kitchen levothyroxine (SYNTHROID, LEVOTHROID) 25 MCG tablet Take 1 tablet (25 mcg total) by mouth daily. 90 tablet 1  . loperamide (ANTI-DIARRHEAL) 2 MG capsule Take 4 mg by mouth as needed for diarrhea or loose stools (up to 8 capsules per day).     . Macitentan (OPSUMIT) 10 MG TABS Take 10 mg by mouth daily. 90 tablet 3  . mometasone-formoterol (DULERA) 100-5 MCG/ACT AERO Inhale 2 puffs into the lungs daily.     Marland Kitchen omeprazole (PRILOSEC) 40 MG capsule Take 1 capsule (40 mg total) by mouth daily. 90 capsule 2  . potassium chloride 20 MEQ TBCR Take 20 mEq by mouth daily. 30 tablet 3  . Selexipag (UPTRAVI) 1000 MCG TABS Take 1,000 mg by mouth every morning. 90 tablet 2  . Selexipag (UPTRAVI)  800 MCG TABS Take 1 tablet (800 mcg total) by mouth every evening. 90 tablet 2  . spironolactone (ALDACTONE) 50 MG tablet TAKE 1 TABLET DAILY 90 tablet 0  . Tadalafil, PAH, (ADCIRCA) 20 MG TABS TAKE 2 TABLETS (40 MG TOTAL) DAILY 180 tablet 2   No current facility-administered medications for this visit.      Allergies:  Allergies  Allergen Reactions  . Aspirin Other (See Comments)    hypertension    Past  Medical History, Surgical history, Social history, and Family History were reviewed and updated.   Physical Exam: Blood pressure 130/79, pulse 85, temperature 97.9 F (36.6 C), temperature source Oral, resp. rate 18, height _0  (1.88 m), weight 204 lb 12.8 oz (92.9 kg), SpO2 98 %. ECOG: 1 General appearance: alert and cooperative appeared without distress. Head: Normocephalic, without obvious abnormality. No oral ulcers or lesions. No bleeding noted. Neck: no adenopathy Lymph nodes: Cervical, supraclavicular, and axillary nodes normal. Heart:regular rate and rhythm, S1, S2 normal, no murmur, click, rub or gallop Lung:chest clear, no wheezing, rales, normal symmetric air entry.  Abdomin: soft, non-tender, without masses or organomegaly no shifting dullness or ascites noted. EXT:no erythema, induration, or nodules. Trace edema at the ankle noted.   Lab Results: Lab Results  Component Value Date   WBC 1.4 Repeated and verified X2. (LL) 06/20/2016   HGB 11.1 (L) 06/20/2016   HCT 32.5 (L) 06/20/2016   MCV 105.4 (H) 06/20/2016   PLT 42.0 Repeated and verified X2. (LL) 06/20/2016     Chemistry      Component Value Date/Time   NA 138 06/20/2016 0944   NA 140 06/15/2015 1414   K 4.2 06/20/2016 0944   K 3.6 06/15/2015 1414   CL 111 06/20/2016 0944   CL 106 06/15/2015 1414   CO2 20 06/20/2016 0944   CO2 17 (L) 06/15/2015 1414   BUN 32 (H) 06/20/2016 0944   BUN 18 06/15/2015 1414   CREATININE 1.60 (H) 06/20/2016 0944   CREATININE 1.5 (H) 06/15/2015 1414      Component Value Date/Time   CALCIUM 8.9 06/20/2016 0944   CALCIUM 9.5 06/15/2015 1414   ALKPHOS 208 (H) 06/20/2016 0944   ALKPHOS 145 (H) 06/15/2015 1414   AST 81 (H) 06/20/2016 0944   AST 68 (H) 06/15/2015 1414   ALT 24 06/20/2016 0944   ALT 35 06/15/2015 1414   BILITOT 0.8 06/20/2016 0944   BILITOT 1.40 06/15/2015 1414       Impression and Plan:  65 year old gentleman with the following issues:  1.  Thrombocytopenia and leukocytopenia: These findings have been documented since 2012.    These findings are related tohypersplenism which is a consequence of his portal hypertension and cirrhosis of the liver. Because of the portal hypertension and splenomegaly, his thrombocytopenia and leukocytopenia is a direct consequence of spleen clearing his platelets and neutrophils rather more robustly.   His most recent CBC and 06/20/2016 is not dramatically different than her previous CBC obtained in August 2016. I do not see any major drop in his counts that warrants any other intervention.  Bone marrow suppression from alcohol abuse is also a factor at this point.  A primary hematological conditions and bone marrow failure are considered unlikely.  His risk of bleeding remained moderate after invasive procedure and is due to coagulopathy from his liver disease. He underwent a right heart catheterization without major bleeding in June 2017. The risk of spontaneous bleeding remains low as well.  The risk  of opportunistic infection also remains low with an absolute neutrophil count that is exceedingly 1000.  He does not require any growth factor support, transfusion or a bone marrow biopsy at this time.  2. Cirrhosis of the liver: Related to his alcohol consumption which I have counseled him about drinking cessation at this time.  3. Follow-up: Will be in 4 months sooner if needed to.    [facility], MD 8/23/20179:45 AM

## 2016-06-29 ENCOUNTER — Encounter: Payer: Self-pay | Admitting: Family Medicine

## 2016-06-29 DIAGNOSIS — R490 Dysphonia: Secondary | ICD-10-CM | POA: Insufficient documentation

## 2016-06-29 DIAGNOSIS — Z0000 Encounter for general adult medical examination without abnormal findings: Secondary | ICD-10-CM | POA: Insufficient documentation

## 2016-06-29 DIAGNOSIS — D696 Thrombocytopenia, unspecified: Secondary | ICD-10-CM | POA: Insufficient documentation

## 2016-06-29 HISTORY — DX: Thrombocytopenia, unspecified: D69.6

## 2016-06-29 NOTE — Assessment & Plan Note (Signed)
Recently in hospital with an episode of syncope after work up he has had no further episodes and he acknowledges he had been out in heat being active and not hydrating well. Encouraged to avoid alcohol, drink optimal amount of fluids daily and to avoid over exertion in the heat. If he is going to be out and disregarding above directions may drink one small gatorade infrequently to maintain hydration status and he is advised to seek care if recurrent symptoms occur

## 2016-06-29 NOTE — Assessment & Plan Note (Signed)
Well controlled, no changes to meds. Encouraged heart healthy diet such as the DASH diet and exercise as tolerated.

## 2016-06-29 NOTE — Assessment & Plan Note (Signed)
Referred to ENT for further evaluation due to persistence of symptoms.

## 2016-06-29 NOTE — Assessment & Plan Note (Signed)
Asymptomatic, secondary to liver disease and alcohol use, following with hematology.

## 2016-06-29 NOTE — Progress Notes (Signed)
Patient ID: Darren Mueller, male   DOB: 1950/11/14, 65 y.o.   MRN: [id number]   Subjective:    Patient ID: Darren Mueller, male    DOB: 1951/08/20, 65 y.o.   MRN: [id number]  Chief Complaint  Patient presents with  . Hospitalization Follow-up    syncope    HPI Patient is in today for hospital follow up. He was recently seen in hospital for an episode of syncope. He was out at a barbeque gathering and drank alcohol and no good amount of healthy fluids, it was hot and he did not eat well either. He had a single syncopal episode after that. He feels well today and he has had no further episodes. Is noting PND and hoarsenessin throat in am for several months as well. No cough, fevers or other acute complaints. Denies CP/palp/HA/fevers/GI or GU c/o. Taking meds as prescribed  Past Medical History:  Diagnosis Date  . Alcohol dependence ([place]) 03/22/2011   In remission   . Alcohol dependence in remission ([place]) 03/22/2011   In remission   . Alcoholic cirrhosis of liver with ascites ([place]) 2014  . Dermatitis 06/10/2015  . Diarrhea 09/04/2013  . GERD (gastroesophageal reflux disease)   . Gout   . Heart murmur   . Hx of colonic polyps   . Hypertension   . Hypopotassemia   . Otalgia 11/28/2013  . Other chronic pulmonary heart diseases   . Red eye 03/03/2016   right  . Renal insufficiency 07/18/2013  . Seizures ([place])   . Sleep apnea    does not wear CPAP  . Thrombocytopenia ([place]) 06/29/2016  . Unspecified hypothyroidism 01/25/2013  . Unspecified pleural effusion     Past Surgical History:  Procedure Laterality Date  . CARDIAC CATHETERIZATION N/A 12/21/2015   Procedure: Right Heart Cath;  Surgeon: Jolaine Artist, MD;  Location: [facility];  Service: Cardiovascular;  Laterality: N/A;  . CARDIAC CATHETERIZATION N/A 04/15/2016   Procedure: Right/Left Heart Cath and Coronary Angiography;  Surgeon: Jolaine Artist, MD;  Location: [facility];  Service: Cardiovascular;  Laterality:  N/A;  . COLONOSCOPY N/A 12/08/2013   Procedure: COLONOSCOPY;  Surgeon: Milus Banister, MD;  Location: [facility];  Service: Endoscopy;  Laterality: N/A;  . ESOPHAGOGASTRODUODENOSCOPY (EGD) WITH PROPOFOL N/A 03/27/2016   Procedure: ESOPHAGOGASTRODUODENOSCOPY (EGD) WITH PROPOFOL;  Surgeon: Milus Banister, MD;  Location: [facility];  Service: Endoscopy;  Laterality: N/A;  . RIGHT HEART CATHETERIZATION Right 06/06/2014   Procedure: RIGHT HEART CATH;  Surgeon: Jolaine Artist, MD;  Location: [facility];  Service: Cardiovascular;  Laterality: Right;  . UVULOPALATOPHARYNGOPLASTY  1999    Family History  Problem Relation Age of Onset  . Heart disease Mother   . Heart attack Mother   . Hypertension Mother   . Prostate cancer Father   . Colon cancer Neg Hx     Social History   Social History  . Marital status: Divorced    Spouse name: N/A  . Number of children: 3  . Years of education: N/A   Occupational History  . Retired     Social research officer, government   Social History Main Topics  . Smoking status: Former Smoker    Packs/day: 2.00    Years: 5.00    Types: Cigarettes    Quit date: 09/22/1979  . Smokeless tobacco: Never Used  . Alcohol use 3.6 oz/week    6 Shots of liquor per week     Comment: 06/04/2016 "2 vodka &  tonics on MWF"  . Drug use: No  . Sexual activity: Not Currently   Other Topics Concern  . Not on file   Social History Narrative   0 caffeine drinks daily     Outpatient Medications Prior to Visit  Medication Sig Dispense Refill  . albuterol (PROVENTIL HFA;VENTOLIN HFA) 108 (90 BASE) MCG/ACT inhaler Inhale 2 puffs into the lungs every 6 (six) hours as needed for wheezing or shortness of breath.    . benzonatate (TESSALON) 100 MG capsule Take 1 capsule (100 mg total) by mouth 3 (three) times daily as needed. 21 capsule 0  . doxepin (SINEQUAN) 10 MG capsule TAKE 1 TO 2 CAPSULES AT BEDTIME 180 capsule 1  . Fluticasone Propionate 0.05 % LOTN Apply daily to affected area  (Patient taking differently: Apply 1 application topically daily. Apply daily to affected area) 3 Bottle 3  . furosemide (LASIX) 40 MG tablet Take 1 tablet (40 mg total) by mouth daily. Take as directed on Monday and Friday 30 tablet 3  . hyoscyamine (SYMAX-SR) 0.375 MG 12 hr tablet Take 0.375 mg by mouth daily.    Marland Kitchen levothyroxine (SYNTHROID, LEVOTHROID) 25 MCG tablet Take 1 tablet (25 mcg total) by mouth daily. 90 tablet 1  . loperamide (ANTI-DIARRHEAL) 2 MG capsule Take 4 mg by mouth as needed for diarrhea or loose stools (up to 8 capsules per day).     . Macitentan (OPSUMIT) 10 MG TABS Take 10 mg by mouth daily. 90 tablet 3  . mometasone-formoterol (DULERA) 100-5 MCG/ACT AERO Inhale 2 puffs into the lungs daily.     Marland Kitchen omeprazole (PRILOSEC) 40 MG capsule Take 1 capsule (40 mg total) by mouth daily. 90 capsule 2  . potassium chloride 20 MEQ TBCR Take 20 mEq by mouth daily. 30 tablet 3  . Selexipag (UPTRAVI) 1000 MCG TABS Take 1,000 mg by mouth every morning. 90 tablet 2  . Selexipag (UPTRAVI) 800 MCG TABS Take 1 tablet (800 mcg total) by mouth every evening. 90 tablet 2  . spironolactone (ALDACTONE) 50 MG tablet TAKE 1 TABLET DAILY 90 tablet 0  . Tadalafil, PAH, (ADCIRCA) 20 MG TABS TAKE 2 TABLETS (40 MG TOTAL) DAILY 180 tablet 2   No facility-administered medications prior to visit.     Allergies  Allergen Reactions  . Aspirin Other (See Comments)    hypertension    Review of Systems  Constitutional: Positive for malaise/fatigue. Negative for fever.  HENT: Negative for congestion.   Eyes: Negative for blurred vision.  Respiratory: Positive for shortness of breath.   Cardiovascular: Negative for chest pain, palpitations and leg swelling.  Gastrointestinal: Negative for abdominal pain, blood in stool and nausea.  Genitourinary: Negative for dysuria and frequency.  Musculoskeletal: Negative for falls.  Skin: Negative for rash.  Neurological: Positive for loss of consciousness.  Negative for dizziness and headaches.  Endo/Heme/Allergies: Negative for environmental allergies.  Psychiatric/Behavioral: Negative for depression. The patient is not nervous/anxious.        Objective:    Physical Exam  Constitutional: He is oriented to person, place, and time. He appears well-developed and well-nourished. No distress.  HENT:  Head: Normocephalic and atraumatic.  Nose: Nose normal.  Eyes: Right eye exhibits no discharge. Left eye exhibits no discharge.  Neck: Normal range of motion. Neck supple.  Cardiovascular: Normal rate.   Murmur heard. Pulmonary/Chest: Effort normal and breath sounds normal.  Abdominal: Soft. Bowel sounds are normal. There is no tenderness.  Musculoskeletal: He exhibits no edema.  Neurological:  He is alert and oriented to person, place, and time.  Skin: Skin is warm and dry.  Psychiatric: He has a normal mood and affect.  Nursing note and vitals reviewed.   BP 108/62 (BP Location: Left Arm, Patient Position: Sitting, Cuff Size: Large)   Pulse (!) 102   Temp 97.8 F (36.6 C) (Oral)   Ht 6' (1.829 m)   Wt 201 lb 2 oz (91.2 kg)   SpO2 95%   BMI 27.28 kg/m  Wt Readings from Last 3 Encounters:  06/25/16 204 lb 12.8 oz (92.9 kg)  06/20/16 203 lb (92.1 kg)  06/19/16 202 lb 12 oz (92 kg)     Lab Results  Component Value Date   WBC 1.4 Repeated and verified X2. (LL) 06/20/2016   HGB 11.1 (L) 06/20/2016   HCT 32.5 (L) 06/20/2016   PLT 42.0 Repeated and verified X2. (LL) 06/20/2016   GLUCOSE 95 06/20/2016   CHOL 198 06/20/2016   TRIG 485.0 (H) 06/20/2016   HDL 62.40 06/20/2016   LDLDIRECT 68.0 06/20/2016   LDLCALC 77 12/04/2015   ALT 24 06/20/2016   AST 81 (H) 06/20/2016   NA 138 06/20/2016   K 4.2 06/20/2016   CL 111 06/20/2016   CREATININE 1.60 (H) 06/20/2016   BUN 32 (H) 06/20/2016   CO2 20 06/20/2016   TSH 3.47 06/20/2016   PSA 0.49 11/01/2012   INR 1.25 06/04/2016   HGBA1C 5.4 03/02/2015    Lab Results  Component  Value Date   TSH 3.47 06/20/2016   Lab Results  Component Value Date   WBC 1.4 Repeated and verified X2. (LL) 06/20/2016   HGB 11.1 (L) 06/20/2016   HCT 32.5 (L) 06/20/2016   MCV 105.4 (H) 06/20/2016   PLT 42.0 Repeated and verified X2. (LL) 06/20/2016   Lab Results  Component Value Date   NA 138 06/20/2016   K 4.2 06/20/2016   CO2 20 06/20/2016   GLUCOSE 95 06/20/2016   BUN 32 (H) 06/20/2016   CREATININE 1.60 (H) 06/20/2016   BILITOT 0.8 06/20/2016   ALKPHOS 208 (H) 06/20/2016   AST 81 (H) 06/20/2016   ALT 24 06/20/2016   PROT 7.0 06/20/2016   ALBUMIN 3.8 06/20/2016   CALCIUM 8.9 06/20/2016   ANIONGAP 8 06/06/2016   GFR 55.83 (L) 06/20/2016   Lab Results  Component Value Date   CHOL 198 06/20/2016   Lab Results  Component Value Date   HDL 62.40 06/20/2016   Lab Results  Component Value Date   LDLCALC 77 12/04/2015   Lab Results  Component Value Date   TRIG 485.0 (H) 06/20/2016   Lab Results  Component Value Date   CHOLHDL 3 06/20/2016   Lab Results  Component Value Date   HGBA1C 5.4 03/02/2015       Assessment & Plan:   Problem List Items Addressed This Visit    Hypertension    Well controlled, no changes to meds. Encouraged heart healthy diet such as the DASH diet and exercise as tolerated.       Relevant Orders   CBC w/Diff (Completed)   Comp Met (CMET) (Completed)   Alcohol dependence ([place])    Unfortunately continues to drink alcohol. Encouraged complete cessation due to severity of disease state.      Hypothyroidism    On Levothyroxine, continue to monitor      Syncope and collapse    Recently in hospital with an episode of syncope after work up he has had no  further episodes and he acknowledges he had been out in heat being active and not hydrating well. Encouraged to avoid alcohol, drink optimal amount of fluids daily and to avoid over exertion in the heat. If he is going to be out and disregarding above directions may drink one small  gatorade infrequently to maintain hydration status and he is advised to seek care if recurrent symptoms occur      CHF exacerbation ([place])    Has an appt with cardiology later this week      Relevant Orders   CBC w/Diff (Completed)   Comp Met (CMET) (Completed)   Thrombocytopenia ([place])    Asymptomatic, secondary to liver disease and alcohol use, following with hematology.        Other Visit Diagnoses    Dyspepsia    -  Primary   Relevant Orders   CBC w/Diff (Completed)   Comp Met (CMET) (Completed)   H. pylori antibody, IgG (Completed)      I am having Mr. Delcastillo maintain his loperamide, Macitentan, Fluticasone Propionate, albuterol, Selexipag, Selexipag, levothyroxine, omeprazole, mometasone-formoterol, tadalafil (PAH), doxepin, Potassium Chloride ER, benzonatate, spironolactone, hyoscyamine, and furosemide.  No orders of the defined types were placed in this encounter.    Penni Homans, MD

## 2016-06-29 NOTE — Assessment & Plan Note (Signed)
Unfortunately continues to drink alcohol. Encouraged complete cessation due to severity of disease state.

## 2016-06-29 NOTE — Assessment & Plan Note (Signed)
Encouraged complete alcohol cessation

## 2016-06-29 NOTE — Assessment & Plan Note (Signed)
No recurrent episodes as he has been more cautious about the heat and his fluid status

## 2016-06-29 NOTE — Progress Notes (Signed)
Patient ID: Darren Mueller, male   DOB: 01/28/1951, 65 y.o.   MRN: [id number]   Subjective:    Patient ID: Darren Mueller, male    DOB: 19-Apr-1951, 64 y.o.   MRN: [id number]  Chief Complaint  Patient presents with  . Annual Exam    HPI Patient is in today for annual physical exam and follow up on numerous medical problems. He has no recurrence of syncope and is trying harder to avoid the hot weather and to balance his fluids very carefully. His past medical history includes, DM, HTN, hyperlipidemia, cirrhosis secondary to ongoing alcohol use and more. Denies polyuria or polydipsia. Denies CP/palp/HA/congestion/fevers/GI or GU c/o. Taking meds as prescribed. Endorses hoarseness for roughly 2 months. No sore throat, dysphagia or symptomatic reflux. Is doing well at home with ADLs  Past Medical History:  Diagnosis Date  . Alcohol dependence ([place]) 03/22/2011   In remission   . Alcohol dependence in remission ([place]) 03/22/2011   In remission   . Alcoholic cirrhosis of liver with ascites ([place]) 2014  . Dermatitis 06/10/2015  . Diarrhea 09/04/2013  . GERD (gastroesophageal reflux disease)   . Gout   . Heart murmur   . Hx of colonic polyps   . Hypertension   . Hypopotassemia   . Otalgia 11/28/2013  . Other chronic pulmonary heart diseases   . Preventative health care 06/29/2016  . Red eye 03/03/2016   right  . Renal insufficiency 07/18/2013  . Seizures ([place])   . Sleep apnea    does not wear CPAP  . Thrombocytopenia ([place]) 06/29/2016  . Unspecified hypothyroidism 01/25/2013  . Unspecified pleural effusion     Past Surgical History:  Procedure Laterality Date  . CARDIAC CATHETERIZATION N/A 12/21/2015   Procedure: Right Heart Cath;  Surgeon: Jolaine Artist, MD;  Location: [facility];  Service: Cardiovascular;  Laterality: N/A;  . CARDIAC CATHETERIZATION N/A 04/15/2016   Procedure: Right/Left Heart Cath and Coronary Angiography;  Surgeon: Jolaine Artist, MD;  Location: [facility];  Service: Cardiovascular;  Laterality: N/A;  . COLONOSCOPY N/A 12/08/2013   Procedure: COLONOSCOPY;  Surgeon: Milus Banister, MD;  Location: [facility];  Service: Endoscopy;  Laterality: N/A;  . ESOPHAGOGASTRODUODENOSCOPY (EGD) WITH PROPOFOL N/A 03/27/2016   Procedure: ESOPHAGOGASTRODUODENOSCOPY (EGD) WITH PROPOFOL;  Surgeon: Milus Banister, MD;  Location: [facility];  Service: Endoscopy;  Laterality: N/A;  . RIGHT HEART CATHETERIZATION Right 06/06/2014   Procedure: RIGHT HEART CATH;  Surgeon: Jolaine Artist, MD;  Location: [facility];  Service: Cardiovascular;  Laterality: Right;  . UVULOPALATOPHARYNGOPLASTY  1999    Family History  Problem Relation Age of Onset  . Heart disease Mother   . Heart attack Mother   . Hypertension Mother   . Prostate cancer Father   . Colon cancer Neg Hx     Social History   Social History  . Marital status: Divorced    Spouse name: N/A  . Number of children: 3  . Years of education: N/A   Occupational History  . Retired     Social research officer, government   Social History Main Topics  . Smoking status: Former Smoker    Packs/day: 2.00    Years: 5.00    Types: Cigarettes    Quit date: 09/22/1979  . Smokeless tobacco: Never Used  . Alcohol use 3.6 oz/week    6 Shots of liquor per week     Comment: 06/04/2016 "2 vodka & tonics on MWF"  .  Drug use: No  . Sexual activity: Not Currently   Other Topics Concern  . Not on file   Social History Narrative   0 caffeine drinks daily     Outpatient Medications Prior to Visit  Medication Sig Dispense Refill  . albuterol (PROVENTIL HFA;VENTOLIN HFA) 108 (90 BASE) MCG/ACT inhaler Inhale 2 puffs into the lungs every 6 (six) hours as needed for wheezing or shortness of breath.    . benzonatate (TESSALON) 100 MG capsule Take 1 capsule (100 mg total) by mouth 3 (three) times daily as needed. 21 capsule 0  . doxepin (SINEQUAN) 10 MG capsule TAKE 1 TO 2 CAPSULES AT BEDTIME 180 capsule 1  . Fluticasone Propionate  0.05 % LOTN Apply daily to affected area (Patient taking differently: Apply 1 application topically daily. Apply daily to affected area) 3 Bottle 3  . furosemide (LASIX) 40 MG tablet Take 1 tablet (40 mg total) by mouth daily. Take as directed on Monday and Friday 30 tablet 3  . hyoscyamine (SYMAX-SR) 0.375 MG 12 hr tablet Take 0.375 mg by mouth daily.    Marland Kitchen levothyroxine (SYNTHROID, LEVOTHROID) 25 MCG tablet Take 1 tablet (25 mcg total) by mouth daily. 90 tablet 1  . loperamide (ANTI-DIARRHEAL) 2 MG capsule Take 4 mg by mouth as needed for diarrhea or loose stools (up to 8 capsules per day).     . Macitentan (OPSUMIT) 10 MG TABS Take 10 mg by mouth daily. 90 tablet 3  . mometasone-formoterol (DULERA) 100-5 MCG/ACT AERO Inhale 2 puffs into the lungs daily.     Marland Kitchen omeprazole (PRILOSEC) 40 MG capsule Take 1 capsule (40 mg total) by mouth daily. 90 capsule 2  . potassium chloride 20 MEQ TBCR Take 20 mEq by mouth daily. 30 tablet 3  . Selexipag (UPTRAVI) 1000 MCG TABS Take 1,000 mg by mouth every morning. 90 tablet 2  . Selexipag (UPTRAVI) 800 MCG TABS Take 1 tablet (800 mcg total) by mouth every evening. 90 tablet 2  . spironolactone (ALDACTONE) 50 MG tablet TAKE 1 TABLET DAILY 90 tablet 0  . Tadalafil, PAH, (ADCIRCA) 20 MG TABS TAKE 2 TABLETS (40 MG TOTAL) DAILY 180 tablet 2   No facility-administered medications prior to visit.     Allergies  Allergen Reactions  . Aspirin Other (See Comments)    hypertension    Review of Systems  Constitutional: Positive for malaise/fatigue. Negative for chills and fever.  HENT: Negative for congestion, hearing loss and sore throat.   Eyes: Negative for discharge.  Respiratory: Positive for shortness of breath. Negative for cough, sputum production and stridor.   Cardiovascular: Negative for chest pain, palpitations and leg swelling.  Gastrointestinal: Negative for abdominal pain, blood in stool, constipation, diarrhea, heartburn, nausea and vomiting.    Genitourinary: Negative for dysuria, frequency, hematuria and urgency.  Musculoskeletal: Negative for back pain, falls and myalgias.  Skin: Negative for rash.  Neurological: Negative for dizziness, sensory change, loss of consciousness, weakness and headaches.  Endo/Heme/Allergies: Negative for environmental allergies. Does not bruise/bleed easily.  Psychiatric/Behavioral: Negative for depression and suicidal ideas. The patient is not nervous/anxious and does not have insomnia.        Objective:    Physical Exam  Constitutional: He is oriented to person, place, and time. He appears well-developed and well-nourished. No distress.  HENT:  Head: Normocephalic and atraumatic.  Right Ear: External ear normal.  Left Ear: External ear normal.  Nose: Nose normal.  Mouth/Throat: Oropharynx is clear and moist. No oropharyngeal exudate.  Eyes: Conjunctivae and EOM are normal. Pupils are equal, round, and reactive to light. Right eye exhibits no discharge. Left eye exhibits no discharge.  Neck: Normal range of motion. Neck supple. No JVD present. No thyromegaly present.  Cardiovascular: Normal rate and intact distal pulses.   Murmur heard. Pulmonary/Chest: Effort normal and breath sounds normal. No respiratory distress. He has no wheezes. He has no rales. He exhibits no tenderness.  Abdominal: Soft. Bowel sounds are normal. He exhibits no distension and no mass. There is no tenderness. There is no rebound and no guarding.  Musculoskeletal: Normal range of motion. He exhibits no edema or tenderness.  Lymphadenopathy:    He has no cervical adenopathy.  Neurological: He is alert and oriented to person, place, and time. He displays normal reflexes. No cranial nerve deficit. He exhibits normal muscle tone.  Skin: Skin is warm and dry. No rash noted. He is not diaphoretic. No erythema.  Psychiatric: He has a normal mood and affect. His behavior is normal. Judgment and thought content normal.  Nursing  note and vitals reviewed.   BP 128/68 (BP Location: Left Arm, Patient Position: Sitting, Cuff Size: Normal)   Pulse 82   Temp 97.8 F (36.6 C) (Oral)   Resp 17   Ht _0  (1.88 m)   Wt 203 lb (92.1 kg)   SpO2 96%   BMI 26.06 kg/m  Wt Readings from Last 3 Encounters:  06/25/16 204 lb 12.8 oz (92.9 kg)  06/20/16 203 lb (92.1 kg)  06/19/16 202 lb 12 oz (92 kg)     Lab Results  Component Value Date   WBC 1.4 Repeated and verified X2. (LL) 06/20/2016   HGB 11.1 (L) 06/20/2016   HCT 32.5 (L) 06/20/2016   PLT 42.0 Repeated and verified X2. (LL) 06/20/2016   GLUCOSE 95 06/20/2016   CHOL 198 06/20/2016   TRIG 485.0 (H) 06/20/2016   HDL 62.40 06/20/2016   LDLDIRECT 68.0 06/20/2016   LDLCALC 77 12/04/2015   ALT 24 06/20/2016   AST 81 (H) 06/20/2016   NA 138 06/20/2016   K 4.2 06/20/2016   CL 111 06/20/2016   CREATININE 1.60 (H) 06/20/2016   BUN 32 (H) 06/20/2016   CO2 20 06/20/2016   TSH 3.47 06/20/2016   PSA 0.49 11/01/2012   INR 1.25 06/04/2016   HGBA1C 5.4 03/02/2015    Lab Results  Component Value Date   TSH 3.47 06/20/2016   Lab Results  Component Value Date   WBC 1.4 Repeated and verified X2. (LL) 06/20/2016   HGB 11.1 (L) 06/20/2016   HCT 32.5 (L) 06/20/2016   MCV 105.4 (H) 06/20/2016   PLT 42.0 Repeated and verified X2. (LL) 06/20/2016   Lab Results  Component Value Date   NA 138 06/20/2016   K 4.2 06/20/2016   CO2 20 06/20/2016   GLUCOSE 95 06/20/2016   BUN 32 (H) 06/20/2016   CREATININE 1.60 (H) 06/20/2016   BILITOT 0.8 06/20/2016   ALKPHOS 208 (H) 06/20/2016   AST 81 (H) 06/20/2016   ALT 24 06/20/2016   PROT 7.0 06/20/2016   ALBUMIN 3.8 06/20/2016   CALCIUM 8.9 06/20/2016   ANIONGAP 8 06/06/2016   GFR 55.83 (L) 06/20/2016   Lab Results  Component Value Date   CHOL 198 06/20/2016   Lab Results  Component Value Date   HDL 62.40 06/20/2016   Lab Results  Component Value Date   LDLCALC 77 12/04/2015   Lab Results  Component Value  Date  TRIG 485.0 (H) 06/20/2016   Lab Results  Component Value Date   CHOLHDL 3 06/20/2016   Lab Results  Component Value Date   HGBA1C 5.4 03/02/2015       Assessment & Plan:   Problem List Items Addressed This Visit    Hypertension    Well controlled, no changes to meds. Encouraged heart healthy diet such as the DASH diet and exercise as tolerated.       Relevant Orders   TSH (Completed)   CBC (Completed)   Lipid panel (Completed)   Comprehensive metabolic panel (Completed)   Insomnia   GERD (gastroesophageal reflux disease)   Hypothyroidism    On Levothyroxine, continue to monitor      Right heart failure due to pulmonary hypertension [facility])     Following very closely with cardiology and is tolerating meds although he continues to struggle with frequent loose stool.      Chronic renal insufficiency, stage III (moderate)   Syncope and collapse    No recurrent episodes as he has been more cautious about the heat and his fluid status      Pancytopenia (HCC)   Cirrhosis with alcoholism ([place])    Encouraged complete alcohol cessation      Acute on chronic heart failure (HCC)   Chest pain   Thrombocytopenia (H[facility])    Asymptomatic and following with hematology      Hoarseness, chronic - Primary    Referred to ENT for further evaluation due to persistence of symptoms.      Relevant Orders   Ambulatory referral to ENT   Preventative health care    Patient encouraged to maintain heart healthy diet, regular exercise, adequate sleep. Consider daily probiotics. Take medications as prescribed. Given and reviewed copy of ACP documents from [place] of State and encouraged to complete and return       Other Visit Diagnoses   None.     I am having Mr. Wilkie maintain his loperamide, Macitentan, Fluticasone Propionate, albuterol, Selexipag, Selexipag, levothyroxine, omeprazole, mometasone-formoterol, tadalafil (PAH), doxepin, Potassium Chloride ER, benzonatate,  spironolactone, hyoscyamine, and furosemide.  No orders of the defined types were placed in this encounter.    Penni Homans, MD

## 2016-06-29 NOTE — Assessment & Plan Note (Signed)
Following very closely with cardiology and is tolerating meds although he continues to struggle with frequent loose stool.

## 2016-06-29 NOTE — Assessment & Plan Note (Signed)
Asymptomatic and following with hematology

## 2016-06-29 NOTE — Assessment & Plan Note (Signed)
On Levothyroxine, continue to monitor 

## 2016-06-29 NOTE — Assessment & Plan Note (Signed)
On Levothyroxine, continue to monitor

## 2016-06-29 NOTE — Assessment & Plan Note (Signed)
Patient encouraged to maintain heart healthy diet, regular exercise, adequate sleep. Consider daily probiotics. Take medications as prescribed. Given and reviewed copy of ACP documents from [employer] and encouraged to complete and return

## 2016-07-04 ENCOUNTER — Other Ambulatory Visit: Payer: Self-pay | Admitting: Family Medicine

## 2016-07-13 ENCOUNTER — Other Ambulatory Visit: Payer: Self-pay | Admitting: Family Medicine

## 2016-07-19 ENCOUNTER — Emergency Department (HOSPITAL_COMMUNITY): Payer: Medicare Other

## 2016-07-19 ENCOUNTER — Encounter (HOSPITAL_COMMUNITY): Payer: Self-pay | Admitting: Emergency Medicine

## 2016-07-19 ENCOUNTER — Observation Stay (HOSPITAL_COMMUNITY)
Admission: EM | Admit: 2016-07-19 | Discharge: 2016-07-20 | Disposition: A | Discharge status: Home / Self Care | Payer: Medicare Other | Attending: Nephrology | Admitting: Nephrology

## 2016-07-19 ENCOUNTER — Other Ambulatory Visit: Payer: Self-pay | Admitting: Physician Assistant

## 2016-07-19 DIAGNOSIS — N183 Chronic kidney disease, stage 3 (moderate): Secondary | ICD-10-CM | POA: Insufficient documentation

## 2016-07-19 DIAGNOSIS — R0781 Pleurodynia: Secondary | ICD-10-CM | POA: Diagnosis not present

## 2016-07-19 DIAGNOSIS — S299XXA Unspecified injury of thorax, initial encounter: Secondary | ICD-10-CM | POA: Diagnosis not present

## 2016-07-19 DIAGNOSIS — I10 Essential (primary) hypertension: Secondary | ICD-10-CM

## 2016-07-19 DIAGNOSIS — W19XXXA Unspecified fall, initial encounter: Secondary | ICD-10-CM | POA: Diagnosis not present

## 2016-07-19 DIAGNOSIS — M25511 Pain in right shoulder: Secondary | ICD-10-CM | POA: Diagnosis not present

## 2016-07-19 DIAGNOSIS — Z87891 Personal history of nicotine dependence: Secondary | ICD-10-CM | POA: Insufficient documentation

## 2016-07-19 DIAGNOSIS — K7030 Alcoholic cirrhosis of liver without ascites: Secondary | ICD-10-CM | POA: Insufficient documentation

## 2016-07-19 DIAGNOSIS — E039 Hypothyroidism, unspecified: Secondary | ICD-10-CM | POA: Insufficient documentation

## 2016-07-19 DIAGNOSIS — I4581 Long QT syndrome: Secondary | ICD-10-CM

## 2016-07-19 DIAGNOSIS — F1020 Alcohol dependence, uncomplicated: Secondary | ICD-10-CM | POA: Diagnosis not present

## 2016-07-19 DIAGNOSIS — S279XXA Injury of unspecified intrathoracic organ, initial encounter: Secondary | ICD-10-CM | POA: Diagnosis not present

## 2016-07-19 DIAGNOSIS — S20211A Contusion of right front wall of thorax, initial encounter: Secondary | ICD-10-CM

## 2016-07-19 DIAGNOSIS — Z8042 Family history of malignant neoplasm of prostate: Secondary | ICD-10-CM | POA: Insufficient documentation

## 2016-07-19 DIAGNOSIS — E876 Hypokalemia: Secondary | ICD-10-CM | POA: Diagnosis not present

## 2016-07-19 DIAGNOSIS — K529 Noninfective gastroenteritis and colitis, unspecified: Secondary | ICD-10-CM | POA: Insufficient documentation

## 2016-07-19 DIAGNOSIS — S2231XA Fracture of one rib, right side, initial encounter for closed fracture: Secondary | ICD-10-CM | POA: Diagnosis not present

## 2016-07-19 DIAGNOSIS — I5022 Chronic systolic (congestive) heart failure: Secondary | ICD-10-CM | POA: Insufficient documentation

## 2016-07-19 DIAGNOSIS — Z886 Allergy status to analgesic agent status: Secondary | ICD-10-CM | POA: Insufficient documentation

## 2016-07-19 DIAGNOSIS — J449 Chronic obstructive pulmonary disease, unspecified: Secondary | ICD-10-CM | POA: Diagnosis not present

## 2016-07-19 DIAGNOSIS — Z8249 Family history of ischemic heart disease and other diseases of the circulatory system: Secondary | ICD-10-CM | POA: Diagnosis not present

## 2016-07-19 DIAGNOSIS — K766 Portal hypertension: Secondary | ICD-10-CM | POA: Diagnosis not present

## 2016-07-19 DIAGNOSIS — E038 Other specified hypothyroidism: Secondary | ICD-10-CM

## 2016-07-19 DIAGNOSIS — N189 Chronic kidney disease, unspecified: Secondary | ICD-10-CM | POA: Diagnosis not present

## 2016-07-19 DIAGNOSIS — M109 Gout, unspecified: Secondary | ICD-10-CM | POA: Insufficient documentation

## 2016-07-19 DIAGNOSIS — K7460 Unspecified cirrhosis of liver: Secondary | ICD-10-CM | POA: Diagnosis present

## 2016-07-19 DIAGNOSIS — R55 Syncope and collapse: Principal | ICD-10-CM | POA: Insufficient documentation

## 2016-07-19 DIAGNOSIS — D696 Thrombocytopenia, unspecified: Secondary | ICD-10-CM

## 2016-07-19 DIAGNOSIS — I272 Other secondary pulmonary hypertension: Secondary | ICD-10-CM

## 2016-07-19 DIAGNOSIS — Z9889 Other specified postprocedural states: Secondary | ICD-10-CM | POA: Diagnosis not present

## 2016-07-19 DIAGNOSIS — I130 Hypertensive heart and chronic kidney disease with heart failure and stage 1 through stage 4 chronic kidney disease, or unspecified chronic kidney disease: Secondary | ICD-10-CM | POA: Diagnosis not present

## 2016-07-19 DIAGNOSIS — Z8669 Personal history of other diseases of the nervous system and sense organs: Secondary | ICD-10-CM | POA: Diagnosis not present

## 2016-07-19 DIAGNOSIS — K219 Gastro-esophageal reflux disease without esophagitis: Secondary | ICD-10-CM | POA: Insufficient documentation

## 2016-07-19 DIAGNOSIS — I2721 Secondary pulmonary arterial hypertension: Secondary | ICD-10-CM | POA: Diagnosis present

## 2016-07-19 DIAGNOSIS — I2729 Other secondary pulmonary hypertension: Secondary | ICD-10-CM | POA: Diagnosis present

## 2016-07-19 DIAGNOSIS — I50810 Right heart failure, unspecified: Secondary | ICD-10-CM | POA: Diagnosis present

## 2016-07-19 DIAGNOSIS — N184 Chronic kidney disease, stage 4 (severe): Secondary | ICD-10-CM | POA: Diagnosis present

## 2016-07-19 DIAGNOSIS — D61818 Other pancytopenia: Secondary | ICD-10-CM | POA: Diagnosis not present

## 2016-07-19 DIAGNOSIS — G4730 Sleep apnea, unspecified: Secondary | ICD-10-CM | POA: Insufficient documentation

## 2016-07-19 LAB — CBC WITH DIFFERENTIAL/PLATELET
Basophils Absolute: 0 10*3/uL (ref 0.0–0.1)
Basophils Relative: 0 %
EOS ABS: 0 10*3/uL (ref 0.0–0.7)
Eosinophils Relative: 1 %
HEMATOCRIT: 39 % (ref 39.0–52.0)
HEMOGLOBIN: 12.7 g/dL — AB (ref 13.0–17.0)
LYMPHS ABS: 0.7 10*3/uL (ref 0.7–4.0)
LYMPHS PCT: 28 %
MCH: 35.3 pg — AB (ref 26.0–34.0)
MCHC: 32.6 g/dL (ref 30.0–36.0)
MCV: 108.3 fL — AB (ref 78.0–100.0)
MONOS PCT: 9 %
Monocytes Absolute: 0.2 10*3/uL (ref 0.1–1.0)
NEUTROS PCT: 62 %
Neutro Abs: 1.5 10*3/uL — ABNORMAL LOW (ref 1.7–7.7)
Platelets: 50 10*3/uL — ABNORMAL LOW (ref 150–400)
RBC: 3.6 MIL/uL — AB (ref 4.22–5.81)
RDW: 15.1 % (ref 11.5–15.5)
WBC: 2.4 10*3/uL — AB (ref 4.0–10.5)

## 2016-07-19 LAB — BRAIN NATRIURETIC PEPTIDE: B NATRIURETIC PEPTIDE 5: 665.9 pg/mL — AB (ref 0.0–100.0)

## 2016-07-19 LAB — BASIC METABOLIC PANEL
Anion gap: 9 (ref 5–15)
BUN: 17 mg/dL (ref 6–20)
CHLORIDE: 108 mmol/L (ref 101–111)
CO2: 21 mmol/L — AB (ref 22–32)
CREATININE: 1.36 mg/dL — AB (ref 0.61–1.24)
Calcium: 8.6 mg/dL — ABNORMAL LOW (ref 8.9–10.3)
GFR calc Af Amer: >60 mL/min (ref >60)
GFR calc non Af Amer: 53 mL/min — ABNORMAL LOW (ref >60)
GLUCOSE: 94 mg/dL (ref 65–99)
POTASSIUM: 3.4 mmol/L — AB (ref 3.5–5.1)
SODIUM: 138 mmol/L (ref 135–145)

## 2016-07-19 LAB — CBG MONITORING, ED: GLUCOSE-CAPILLARY: 85 mg/dL (ref 65–99)

## 2016-07-19 LAB — ETHANOL: Alcohol, Ethyl (B): 145 mg/dL — ABNORMAL HIGH (ref <5)

## 2016-07-19 LAB — MAGNESIUM: Magnesium: 1.6 mg/dL — ABNORMAL LOW (ref 1.7–2.4)

## 2016-07-19 MED ORDER — LORAZEPAM 2 MG/ML IJ SOLN: 1.0000 mg | Freq: Four times a day (QID) | INTRAMUSCULAR | Status: DC | PRN

## 2016-07-19 MED ORDER — FENTANYL CITRATE (PF) 100 MCG/2ML IJ SOLN
50.0000 ug | Freq: Once | INTRAMUSCULAR | Status: AC
  Administered 2016-07-19: 50 ug via INTRAVENOUS
  Filled 2016-07-19: qty 2

## 2016-07-19 MED ORDER — MAGNESIUM SULFATE 2 GM/50ML IV SOLN
2.0000 g | Freq: Once | INTRAVENOUS | Status: AC
  Administered 2016-07-19: 2 g via INTRAVENOUS
  Filled 2016-07-19: qty 50

## 2016-07-19 MED ORDER — ADULT MULTIVITAMIN W/MINERALS CH
1.0000 | ORAL_TABLET | Freq: Every day | ORAL | Status: DC
  Administered 2016-07-19 – 2016-07-20 (×2): 1 via ORAL
  Filled 2016-07-19 (×2): qty 1

## 2016-07-19 MED ORDER — MORPHINE SULFATE (PF) 2 MG/ML IV SOLN
2.0000 mg | Freq: Once | INTRAVENOUS | Status: AC
  Administered 2016-07-19: 2 mg via INTRAVENOUS
  Filled 2016-07-19: qty 1

## 2016-07-19 MED ORDER — PANTOPRAZOLE SODIUM 40 MG PO TBEC
40.0000 mg | DELAYED_RELEASE_TABLET | Freq: Every day | ORAL | Status: DC
  Administered 2016-07-19 – 2016-07-20 (×2): 40 mg via ORAL
  Filled 2016-07-19 (×2): qty 1

## 2016-07-19 MED ORDER — THIAMINE HCL 100 MG/ML IJ SOLN: 100.0000 mg | Freq: Every day | INTRAMUSCULAR | Status: DC

## 2016-07-19 MED ORDER — POTASSIUM CHLORIDE CRYS ER 10 MEQ PO TBCR
20.0000 meq | EXTENDED_RELEASE_TABLET | Freq: Every day | ORAL | Status: DC
  Administered 2016-07-19 – 2016-07-20 (×2): 20 meq via ORAL
  Filled 2016-07-19 (×3): qty 2

## 2016-07-19 MED ORDER — SELEXIPAG 800 MCG PO TABS
800.0000 ug | ORAL_TABLET | Freq: Every evening | ORAL | Status: DC
  Administered 2016-07-19: 800 ug via ORAL

## 2016-07-19 MED ORDER — HYOSCYAMINE SULFATE ER 0.375 MG PO TB12
0.3750 mg | ORAL_TABLET | Freq: Every day | ORAL | Status: DC
  Administered 2016-07-19 – 2016-07-20 (×2): 0.375 mg via ORAL
  Filled 2016-07-19 (×2): qty 1

## 2016-07-19 MED ORDER — MORPHINE SULFATE (PF) 4 MG/ML IV SOLN
4.0000 mg | INTRAVENOUS | Status: AC | PRN
  Administered 2016-07-19 (×2): 4 mg via INTRAVENOUS
  Filled 2016-07-19 (×2): qty 1

## 2016-07-19 MED ORDER — TADALAFIL (PAH) 20 MG PO TABS
40.0000 mg | ORAL_TABLET | Freq: Every day | ORAL | Status: DC
  Administered 2016-07-19 – 2016-07-20 (×2): 40 mg via ORAL
  Filled 2016-07-19 (×2): qty 2

## 2016-07-19 MED ORDER — FOLIC ACID 1 MG PO TABS
1.0000 mg | ORAL_TABLET | Freq: Every day | ORAL | Status: DC
  Administered 2016-07-19 – 2016-07-20 (×2): 1 mg via ORAL
  Filled 2016-07-19 (×2): qty 1

## 2016-07-19 MED ORDER — MACITENTAN 10 MG PO TABS
10.0000 mg | ORAL_TABLET | Freq: Every day | ORAL | Status: DC
Start: 2016-07-20 — End: 2016-07-20
  Administered 2016-07-20: 10 mg via ORAL

## 2016-07-19 MED ORDER — POTASSIUM CHLORIDE CRYS ER 20 MEQ PO TBCR
40.0000 meq | EXTENDED_RELEASE_TABLET | Freq: Once | ORAL | Status: AC
  Administered 2016-07-19: 40 meq via ORAL
  Filled 2016-07-19: qty 2

## 2016-07-19 MED ORDER — VITAMIN B-1 100 MG PO TABS
100.0000 mg | ORAL_TABLET | Freq: Every day | ORAL | Status: DC
  Administered 2016-07-19 – 2016-07-20 (×2): 100 mg via ORAL
  Filled 2016-07-19 (×2): qty 1

## 2016-07-19 MED ORDER — MOMETASONE FURO-FORMOTEROL FUM 100-5 MCG/ACT IN AERO
2.0000 | INHALATION_SPRAY | Freq: Every day | RESPIRATORY_TRACT | Status: DC
  Administered 2016-07-19 – 2016-07-20 (×2): 2 via RESPIRATORY_TRACT
  Filled 2016-07-19: qty 8.8

## 2016-07-19 MED ORDER — LEVOTHYROXINE SODIUM 25 MCG PO TABS
25.0000 ug | ORAL_TABLET | Freq: Every day | ORAL | Status: DC
  Administered 2016-07-20: 25 ug via ORAL
  Filled 2016-07-19: qty 1

## 2016-07-19 MED ORDER — SPIRONOLACTONE 25 MG PO TABS
50.0000 mg | ORAL_TABLET | Freq: Every day | ORAL | Status: DC
  Administered 2016-07-19 – 2016-07-20 (×2): 50 mg via ORAL
  Filled 2016-07-19 (×2): qty 2

## 2016-07-19 MED ORDER — SELEXIPAG 1000 MCG PO TABS
1000.0000 ug | ORAL_TABLET | Freq: Every morning | ORAL | Status: DC
  Administered 2016-07-19 – 2016-07-20 (×2): 1000 ug via ORAL

## 2016-07-19 MED ORDER — LORAZEPAM 1 MG PO TABS
1.0000 mg | ORAL_TABLET | Freq: Four times a day (QID) | ORAL | Status: DC | PRN
  Administered 2016-07-19: 1 mg via ORAL
  Filled 2016-07-19: qty 1

## 2016-07-19 MED ORDER — TRAMADOL HCL 50 MG PO TABS
50.0000 mg | ORAL_TABLET | Freq: Four times a day (QID) | ORAL | Status: DC | PRN
  Administered 2016-07-19 – 2016-07-20 (×4): 50 mg via ORAL
  Filled 2016-07-19 (×4): qty 1

## 2016-07-19 MED ORDER — SODIUM CHLORIDE 0.9% FLUSH
3.0000 mL | Freq: Two times a day (BID) | INTRAVENOUS | Status: DC
  Administered 2016-07-19 – 2016-07-20 (×3): 3 mL via INTRAVENOUS

## 2016-07-19 MED ORDER — FUROSEMIDE 20 MG PO TABS
40.0000 mg | ORAL_TABLET | Freq: Every day | ORAL | Status: DC
  Administered 2016-07-19 – 2016-07-20 (×2): 40 mg via ORAL
  Filled 2016-07-19 (×2): qty 2

## 2016-07-19 MED ORDER — LOSARTAN POTASSIUM 50 MG PO TABS
50.0000 mg | ORAL_TABLET | Freq: Every day | ORAL | Status: DC
  Administered 2016-07-19 – 2016-07-20 (×2): 50 mg via ORAL
  Filled 2016-07-19 (×2): qty 1

## 2016-07-19 NOTE — ED Provider Notes (Signed)
[facility] DEPT Provider Note   CSN: [id number] Arrival date & time: 07/19/16  0144  By signing my name below, I, Irene Pap, attest that this documentation has been prepared under the direction and in the presence of Varney Biles, MD. Electronically Signed: Irene Pap, ED Scribe. 07/19/16. 2:24 AM.  History   Chief Complaint Chief Complaint  Patient presents with  . Loss of Consciousness   The history is provided by the patient. No language interpreter was used.  HPI Comments: Darren Mueller is a 65 y.o. Male with a hx of alcoholic cirrhosis of the liver, HTN, seizures, CHF, COPD, and thrombocytopenia brought in by EMS who presents to the Emergency Department complaining of syncope onset PTA. Pt states that he experienced a fall, resulting in right rib cage pain. However, he does not recall the fall or having a pre-syncopal aura. Pt was cooking before he experienced syncope. He woke up on the floor looking for his glasses. Son states that he discovered the patient and that the pt was very pale, disoriented, and breathing heavily. No incontinence. No hx of seizure. Pt says that he drinks occasionally and admits to drinking this evening. He does not believe his syncopal episode is related to his drinking. He reports hx of similar symptoms, most recently August 2017. He denies hx of blood clots or hx of cancer. Pt denies chest pain, SOB, headache or neck pain.   Past Medical History:  Diagnosis Date  . Alcohol dependence ([place]) 03/22/2011   In remission   . Alcohol dependence in remission ([place]) 03/22/2011   In remission   . Alcoholic cirrhosis of liver with ascites ([place]) 2014  . Dermatitis 06/10/2015  . Diarrhea 09/04/2013  . GERD (gastroesophageal reflux disease)   . Gout   . Heart murmur   . Hx of colonic polyps   . Hypertension   . Hypopotassemia   . Otalgia 11/28/2013  . Other chronic pulmonary heart diseases   . Preventative health care 06/29/2016  . Red eye 03/03/2016   right  . Renal insufficiency 07/18/2013  . Seizures ([place])   . Sleep apnea    does not wear CPAP  . Thrombocytopenia ([place]) 06/29/2016  . Unspecified hypothyroidism 01/25/2013  . Unspecified pleural effusion     Patient Active Problem List   Diagnosis Date Noted  . Syncope 07/19/2016  . Thrombocytopenia ([place]) 06/29/2016  . Hoarseness, chronic 06/29/2016  . Preventative health care 06/29/2016  . Systolic CHF, acute on chronic ([place]) 04/14/2016  . Acute on chronic heart failure ([place])   . Chest pain   . Midline low back pain without sciatica 12/09/2015  . Cirrhosis with alcoholism ([place]) 08/08/2015  . Pancytopenia ([place]) 06/10/2015  . Dermatitis 06/10/2015  . OSA (obstructive sleep apnea) 02/13/2015  . Gout 07/30/2014  . Otitis, externa, infective 03/19/2014  . Nonspecific (abnormal) findings on radiological and other examination of gastrointestinal tract 12/08/2013  . Otalgia 11/28/2013  . Syncope and collapse 10/18/2013  . Diarrhea 09/04/2013  . Chronic renal insufficiency, stage III (moderate) 07/18/2013  . Right heart failure due to pulmonary hypertension ([place]) 06/06/2013  . Hypothyroidism 01/25/2013  . PAH (pulmonary arterial hypertension) with portal hypertension ([place]) 01/21/2013  . Dyspnea 01/12/2013  . Allergic rhinitis 12/21/2012  . Secondary pulmonary hypertension ([place]) 12/09/2012  . Hepatic cirrhosis ([place]) 11/26/2012  . COPD bronchitis 11/01/2012  . GERD (gastroesophageal reflux disease) 09/05/2011  . Hypokalemia 08/29/2011  . Macrocytosis 08/29/2011  . Colon polyps 03/26/2011  . Insomnia 03/26/2011  . Hypertension 03/22/2011  .  Alcohol dependence ([place]) 03/22/2011  . Seizure disorder ([place]) 03/22/2011    Past Surgical History:  Procedure Laterality Date  . CARDIAC CATHETERIZATION N/A 12/21/2015   Procedure: Right Heart Cath;  Surgeon: Jolaine Artist, MD;  Location: [facility];  Service: Cardiovascular;  Laterality: N/A;  . CARDIAC CATHETERIZATION N/A  04/15/2016   Procedure: Right/Left Heart Cath and Coronary Angiography;  Surgeon: Jolaine Artist, MD;  Location: [facility];  Service: Cardiovascular;  Laterality: N/A;  . COLONOSCOPY N/A 12/08/2013   Procedure: COLONOSCOPY;  Surgeon: Milus Banister, MD;  Location: [facility];  Service: Endoscopy;  Laterality: N/A;  . ESOPHAGOGASTRODUODENOSCOPY (EGD) WITH PROPOFOL N/A 03/27/2016   Procedure: ESOPHAGOGASTRODUODENOSCOPY (EGD) WITH PROPOFOL;  Surgeon: Milus Banister, MD;  Location: [facility];  Service: Endoscopy;  Laterality: N/A;  . RIGHT HEART CATHETERIZATION Right 06/06/2014   Procedure: RIGHT HEART CATH;  Surgeon: Jolaine Artist, MD;  Location: [facility] LAB;  Service: Cardiovascular;  Laterality: Right;  . UVULOPALATOPHARYNGOPLASTY  1999    Home Medications    Prior to Admission medications   Medication Sig Start Date End Date Taking? Authorizing Provider  doxepin (SINEQUAN) 10 MG capsule TAKE 1 TO 2 CAPSULES AT BEDTIME 04/15/16  Yes Mosie Lukes, MD  furosemide (LASIX) 40 MG tablet Take 1 tablet (40 mg total) by mouth daily. Take as directed on Monday and Friday 06/06/16  Yes Orson Eva, MD  hyoscyamine (SYMAX-SR) 0.375 MG 12 hr tablet Take 0.375 mg by mouth daily.   Yes Historical Provider, MD  levothyroxine (SYNTHROID, LEVOTHROID) 25 MCG tablet TAKE 1 TABLET DAILY 07/13/16  Yes Mosie Lukes, MD  loperamide (ANTI-DIARRHEAL) 2 MG capsule Take 4 mg by mouth as needed for diarrhea or loose stools (up to 8 capsules per day).    Yes Historical Provider, MD  losartan (COZAAR) 50 MG tablet Take 50 mg by mouth daily.   Yes Historical Provider, MD  Macitentan (OPSUMIT) 10 MG TABS Take 10 mg by mouth daily. 03/23/14  Yes Shaune Pascal Bensimhon, MD  mometasone-formoterol (DULERA) 100-5 MCG/ACT AERO Inhale 2 puffs into the lungs daily.    Yes Historical Provider, MD  omeprazole (PRILOSEC) 40 MG capsule Take 1 capsule (40 mg total) by mouth daily. 12/04/15  Yes Mosie Lukes, MD  potassium  chloride 20 MEQ TBCR Take 20 mEq by mouth daily. 04/16/16  Yes Ripudeep Krystal Eaton, MD  Selexipag (UPTRAVI) 1000 MCG TABS Take 1,000 mg by mouth every morning. 11/13/15  Yes Shaune Pascal Bensimhon, MD  Selexipag (UPTRAVI) 800 MCG TABS Take 1 tablet (800 mcg total) by mouth every evening. 11/13/15  Yes Jolaine Artist, MD  spironolactone (ALDACTONE) 50 MG tablet TAKE 1 TABLET DAILY 06/01/16  Yes Mosie Lukes, MD  Tadalafil, PAH, (ADCIRCA) 20 MG TABS TAKE 2 TABLETS (40 MG TOTAL) DAILY 01/01/16  Yes Jolaine Artist, MD  albuterol (PROVENTIL HFA;VENTOLIN HFA) 108 (90 BASE) MCG/ACT inhaler Inhale 2 puffs into the lungs every 6 (six) hours as needed for wheezing or shortness of breath.    Historical Provider, MD  benzonatate (TESSALON) 100 MG capsule Take 1 capsule (100 mg total) by mouth 3 (three) times daily as needed. 04/28/16   Percell Miller Saguier, PA-C  Fluticasone Propionate 0.05 % LOTN Apply daily to affected area Patient taking differently: Apply 1 application topically daily. Apply daily to affected area 05/15/15   Mosie Lukes, MD    Family History Family History  Problem Relation Age of Onset  .  Heart disease Mother   . Heart attack Mother   . Hypertension Mother   . Prostate cancer Father   . Colon cancer Neg Hx     Social History Social History  Substance Use Topics  . Smoking status: Former Smoker    Packs/day: 2.00    Years: 5.00    Types: Cigarettes    Quit date: 09/22/1979  . Smokeless tobacco: Never Used  . Alcohol use 3.6 oz/week    6 Shots of liquor per week     Comment: 06/04/2016 "2 vodka & tonics on MWF"    Allergies   Aspirin  Review of Systems Review of Systems 10 Systems reviewed and all are negative for acute change except as noted in the HPI.  Physical Exam Updated Vital Signs BP 139/87   Pulse 80   Temp 97.7 F (36.5 C) (Oral)   Resp 21   SpO2 99%   Physical Exam  Constitutional: He appears well-developed and well-nourished. No distress.  HENT:  Head:  Normocephalic and atraumatic.  Mouth/Throat: Oropharynx is clear and moist. No oropharyngeal exudate.  Eyes: Conjunctivae and EOM are normal. Pupils are equal, round, and reactive to light. Right eye exhibits no discharge. Left eye exhibits no discharge. No scleral icterus.  Neck: Normal range of motion. Neck supple. JVD present. No thyromegaly present.  Cardiovascular: Normal rate, regular rhythm and intact distal pulses.  Exam reveals no gallop and no friction rub.   Murmur heard.  Systolic murmur is present  Pulses:      Radial pulses are 2+ on the right side, and 2+ on the left side.  Pulmonary/Chest: Effort normal and breath sounds normal. No respiratory distress. He has no wheezes. He has no rales. He exhibits tenderness.  Right sided chest wall tenderness with no bruising appreciated; slightly diminished breath sounds on right lower lung fields  Abdominal: Soft. Bowel sounds are normal. He exhibits no distension and no mass. There is no tenderness.  Musculoskeletal: Normal range of motion. He exhibits no edema or tenderness.  No unilateral leg swelling  Lymphadenopathy:    He has no cervical adenopathy.  Neurological: He is alert. Coordination normal.  Skin: Skin is warm and dry. No rash noted. No erythema.  Psychiatric: He has a normal mood and affect. His behavior is normal.  Nursing note and vitals reviewed.   ED Treatments / Results  DIAGNOSTIC STUDIES: Oxygen Saturation is 98% on RA, normal by my interpretation.    COORDINATION OF CARE: 2:21 AM-Discussed treatment plan which includes labs and x-ray with pt at bedside and pt agreed to plan.    Labs (all labs ordered are listed, but only abnormal results are displayed) Labs Reviewed  CBC WITH DIFFERENTIAL/PLATELET - Abnormal; Notable for the following:       Result Value   WBC 2.4 (*)    RBC 3.60 (*)    Hemoglobin 12.7 (*)    MCV 108.3 (*)    MCH 35.3 (*)    Platelets 50 (*)    Neutro Abs 1.5 (*)    All other  components within normal limits  BASIC METABOLIC PANEL - Abnormal; Notable for the following:    Potassium 3.4 (*)    CO2 21 (*)    Creatinine, Ser 1.36 (*)    Calcium 8.6 (*)    GFR calc non Af Amer 53 (*)    All other components within normal limits  BRAIN NATRIURETIC PEPTIDE - Abnormal; Notable for the following:    B  Natriuretic Peptide 665.9 (*)    All other components within normal limits  MAGNESIUM - Abnormal; Notable for the following:    Magnesium 1.6 (*)    All other components within normal limits  POCT CBG (FASTING - GLUCOSE)-MANUAL ENTRY  CBG MONITORING, ED    EKG  EKG Interpretation None     ED ECG REPORT   Date: 07/19/2016  Rate: 80  Rhythm: normal sinus rhythm  QRS Axis: normal  Intervals: PR prolonged  ST/T Wave abnormalities: nonspecific ST/T changes  Conduction Disutrbances:none  Narrative Interpretation:   Old EKG Reviewed: unchanged  I have personally reviewed the EKG tracing and agree with the computerized printout as noted. [id number] PATTERN IS NOT NEW   Radiology Dg Ribs Unilateral W/chest Right  Result Date: 07/19/2016 CLINICAL DATA:  Acute onset of syncope and fall. Right lateral rib pain. Initial encounter. EXAM: RIGHT RIBS AND CHEST - 3+ VIEW COMPARISON:  Chest radiograph performed 06/04/2016 FINDINGS: No displaced rib fractures are seen. The lungs are well-aerated and clear. There is no evidence of focal opacification, pleural effusion or pneumothorax. The cardiomediastinal silhouette is mildly enlarged. No acute osseous abnormalities are seen. IMPRESSION: No displaced rib fracture seen. Lungs remain grossly clear. Mild cardiomegaly. Electronically Signed   By: Garald Balding M.D.   On: 07/19/2016 03:17    Procedures Procedures (including critical care time)  Medications Ordered in ED Medications  fentaNYL (SUBLIMAZE) injection 50 mcg (50 mcg Intravenous Given 07/19/16 0324)     Initial Impression / Assessment and Plan / ED Course  I  have reviewed the triage vital signs and the nursing notes.  Pertinent labs & imaging results that were available during my care of the patient were reviewed by me and considered in my medical decision making (see chart for details).  Clinical Course   DDx includes: Orthostatic hypotension Stroke Vertebral artery dissection/stenosis Dysrhythmia PE Vasovagal/neurocardiogenic syncope Aortic stenosis Valvular disorder/Cardiomyopathy Anemia   Pt comes in with cc of syncope. No prodrome - which is concerning. Also has hx of cardiomegaly, pulm HTN, CHF - all of which puts him at risk of dysrhythmia. We will admit. EKG is reassuring. Pt has no hx of PE, DVT and denies any exogenous hormone use, long distance travels or surgery in the past 6 weeks, active cancer, recent immobilization. Pulm HTN can increase the risk for PEs, but clinical suspicion for PE is low at the moment.   Final Clinical Impressions(s) / ED Diagnoses   Final diagnoses:  Syncope and collapse  Rib contusion, right, initial encounter    New Prescriptions New Prescriptions   No medications on file     Varney Biles, MD 07/19/16 [phone]

## 2016-07-19 NOTE — Progress Notes (Signed)
Patient was seen and examined at bedside. He was admitted today morning. Please see H&P for detail.  Briefly, 65 year old male with past medical history of hypertension, alcohol dependence, liver cirrhosis, thrombocytopenia, etc. reflux, hypothyroidism, right ventricular failure presented with recurrent syncopal episode.   Problems list: #Syncopal episode, Alcohol use related (etoh level 145 on admission) versus orthostatic hypotension versus cardiac. Cardiology consult called for event monitoring. Repeat EKG. Checking orthostatic lab.  #Alcohol dependence: Watch for withdrawal. Place CIWA protocol. Ativan when necessary. Folic acid and thiamine ordered.  #Liver cirrhosis,  thrombocytopenia,  Hypokalemia Right chest wall pain: X-rays showed no fracture Hypothyroidism: Tsh level acceptable about a month ago.   BP 153/91, HR 79 Gen:NAD, calm comfortable Respiratory: Clear to auscultation bilateral, no wheezing or crackle Cardiovascular: Regular rate and rhythm, S1-S2 normal GI: Abdomen soft, nontender, bowel sounds positive No lower extremity edema Alert, awake, oriented and following commands.  Continue current medical and supportive care. Observe for withdrawal. Cardiology consult. DVT prophylaxis: With SCD. Patient has thrombocytopenia. Likely discharge home tomorrow.  I discussed with the patient and his brother at bedside.

## 2016-07-19 NOTE — Consult Note (Signed)
CARDIOLOGY CONSULT NOTE   Patient ID: Darren Mueller MRN: [id number] DOB/AGE: 05/12/1951 65 y.o.  Admit date: 07/19/2016 .  Primary Physician   Penni Homans, MD Primary Cardiologist   Dr. Haroldine Laws Reason for Consultation   Syncope Requesting Physician  Dr. Carolin Sicks  HPI: Darren Mueller is a 65 y.o. male with a history of COPD, portopulmonary hypertension with RV failure, and ETOH cirrhosis,ongoing alcohol abuse who presented with recurrent syncope.   Admitted in 6/17 with CP and SOB. Was diuresed. Troponins normal. Underwent R/L cath. Minimal CAD with moderate PAH and preserved cardiac output.   Admitted last month with syncope. Felt to be due to volume depletion and RHF. Lasix cut back to 2x/week and verapamil stopped.    Last seen by Dr. Haroldine Laws 06/19/16 for post hospital. Weight up by - lb. Advised to take extra lasix as needed. Keep weight 198-202.  Last night he had "four drinks" prior to grilling dinner for himself. He went inside home and suddenly passed out. No prodrome. He fell on R side. Total duration "few minutes". Meanwhile son brought steak and found him and EMS was called.   No EKG done. K 3.4. BNP 665.9. Cr 1.36. WBC 2.4. Alcohol level 145.   Past Medical History:  Diagnosis Date  . Alcohol dependence ([place]) 03/22/2011   In remission   . Alcohol dependence in remission ([place]) 03/22/2011   In remission   . Alcoholic cirrhosis of liver with ascites ([place]) 2014  . Dermatitis 06/10/2015  . Diarrhea 09/04/2013  . GERD (gastroesophageal reflux disease)   . Gout   . Heart murmur   . Hx of colonic polyps   . Hypertension   . Hypopotassemia   . Otalgia 11/28/2013  . Other chronic pulmonary heart diseases   . Preventative health care 06/29/2016  . Red eye 03/03/2016   right  . Renal insufficiency 07/18/2013  . Seizures ([place])   . Sleep apnea    does not wear CPAP  . Thrombocytopenia ([place]) 06/29/2016  . Unspecified hypothyroidism 01/25/2013  . Unspecified pleural  effusion      Past Surgical History:  Procedure Laterality Date  . CARDIAC CATHETERIZATION N/A 12/21/2015   Procedure: Right Heart Cath;  Surgeon: Jolaine Artist, MD;  Location: [facility];  Service: Cardiovascular;  Laterality: N/A;  . CARDIAC CATHETERIZATION N/A 04/15/2016   Procedure: Right/Left Heart Cath and Coronary Angiography;  Surgeon: Jolaine Artist, MD;  Location: [facility];  Service: Cardiovascular;  Laterality: N/A;  . COLONOSCOPY N/A 12/08/2013   Procedure: COLONOSCOPY;  Surgeon: Milus Banister, MD;  Location: [facility];  Service: Endoscopy;  Laterality: N/A;  . ESOPHAGOGASTRODUODENOSCOPY (EGD) WITH PROPOFOL N/A 03/27/2016   Procedure: ESOPHAGOGASTRODUODENOSCOPY (EGD) WITH PROPOFOL;  Surgeon: Milus Banister, MD;  Location: [facility];  Service: Endoscopy;  Laterality: N/A;  . RIGHT HEART CATHETERIZATION Right 06/06/2014   Procedure: RIGHT HEART CATH;  Surgeon: Jolaine Artist, MD;  Location: [facility];  Service: Cardiovascular;  Laterality: Right;  . UVULOPALATOPHARYNGOPLASTY  1999    Allergies  Allergen Reactions  . Aspirin Other (See Comments)    hypertension    I have reviewed the patient's current medications . furosemide  40 mg Oral Daily  . hyoscyamine  0.375 mg Oral Daily  . [START ON 07/20/2016] levothyroxine  25 mcg Oral QAC breakfast  . losartan  50 mg Oral Daily  . Macitentan  10 mg Oral Daily  . mometasone-formoterol  2 puff Inhalation Daily  .  pantoprazole  40 mg Oral Daily  . potassium chloride  20 mEq Oral Daily  . Selexipag  1,000 mg Oral q morning - 10a  . Selexipag  800 mcg Oral QPM  . sodium chloride flush  3 mL Intravenous Q12H  . spironolactone  50 mg Oral Daily  . tadalafil (PAH)  40 mg Oral Daily     traMADol  Prior to Admission medications   Medication Sig Start Date End Date Taking? Authorizing Provider  albuterol (PROVENTIL HFA;VENTOLIN HFA) 108 (90 BASE) MCG/ACT inhaler Inhale 2 puffs into the lungs  every 6 (six) hours as needed for wheezing or shortness of breath.   Yes Historical Provider, MD  doxepin (SINEQUAN) 10 MG capsule TAKE 1 TO 2 CAPSULES AT BEDTIME 04/15/16  Yes Mosie Lukes, MD  Fluticasone Propionate 0.05 % LOTN Apply daily to affected area Patient taking differently: Apply 1 application topically daily. Apply daily to affected area 05/15/15  Yes Mosie Lukes, MD  furosemide (LASIX) 40 MG tablet Take 1 tablet (40 mg total) by mouth daily. Take as directed on Monday and Friday 06/06/16  Yes Orson Eva, MD  hyoscyamine (SYMAX-SR) 0.375 MG 12 hr tablet Take 0.375 mg by mouth daily.   Yes Historical Provider, MD  levothyroxine (SYNTHROID, LEVOTHROID) 25 MCG tablet TAKE 1 TABLET DAILY 07/13/16  Yes Mosie Lukes, MD  loperamide (ANTI-DIARRHEAL) 2 MG capsule Take 4 mg by mouth as needed for diarrhea or loose stools (up to 8 capsules per day).    Yes Historical Provider, MD  losartan (COZAAR) 50 MG tablet Take 50 mg by mouth daily.   Yes Historical Provider, MD  Macitentan (OPSUMIT) 10 MG TABS Take 10 mg by mouth daily. 03/23/14  Yes Shaune Pascal Bensimhon, MD  mometasone-formoterol (DULERA) 100-5 MCG/ACT AERO Inhale 2 puffs into the lungs daily.    Yes Historical Provider, MD  omeprazole (PRILOSEC) 40 MG capsule Take 1 capsule (40 mg total) by mouth daily. 12/04/15  Yes Mosie Lukes, MD  potassium chloride 20 MEQ TBCR Take 20 mEq by mouth daily. 04/16/16  Yes Ripudeep Krystal Eaton, MD  Selexipag (UPTRAVI) 1000 MCG TABS Take 1,000 mg by mouth every morning. 11/13/15  Yes Shaune Pascal Bensimhon, MD  Selexipag (UPTRAVI) 800 MCG TABS Take 1 tablet (800 mcg total) by mouth every evening. 11/13/15  Yes Jolaine Artist, MD  spironolactone (ALDACTONE) 50 MG tablet TAKE 1 TABLET DAILY 06/01/16  Yes Mosie Lukes, MD  Tadalafil, PAH, (ADCIRCA) 20 MG TABS TAKE 2 TABLETS (40 MG TOTAL) DAILY 01/01/16  Yes Jolaine Artist, MD     Social History   Social History  . Marital status: Divorced    Spouse name: N/A    . Number of children: 3  . Years of education: N/A   Occupational History  . Retired     Social research officer, government   Social History Main Topics  . Smoking status: Former Smoker    Packs/day: 2.00    Years: 5.00    Types: Cigarettes    Quit date: 09/22/1979  . Smokeless tobacco: Never Used  . Alcohol use 3.6 oz/week    6 Shots of liquor per week     Comment: 06/04/2016 "2 vodka & tonics on MWF"  . Drug use: No  . Sexual activity: Not Currently   Other Topics Concern  . Not on file   Social History Narrative   0 caffeine drinks daily     Family Status  Relation Status  .  Mother Alive  . Father Alive  . Neg Hx    Family History  Problem Relation Age of Onset  . Heart disease Mother   . Heart attack Mother   . Hypertension Mother   . Prostate cancer Father   . Colon cancer Neg Hx       ROS:  Full 14 point review of systems complete and found to be negative unless listed above.  Physical Exam: Blood pressure (!) 152/93, pulse 70, temperature 97.7 F (36.5 C), temperature source Oral, resp. rate 20, height _0  (1.854 m), weight 198 lb 4.8 oz (89.9 kg), SpO2 98 %.  General: Well developed, well nourished, male in no acute distress Head: Eyes PERRLA, No xanthomas. Normocephalic and atraumatic, oropharynx without edema or exudate.  Lungs: Resp regular and unlabored, CTA. R sided pain with palpation.  Heart: RRR no s3, s4, or murmurs..   Neck: No carotid bruits. No lymphadenopathy. No JVD. Abdomen: Bowel sounds present, abdomen soft and non-tender without masses or hernias noted. Msk:  No spine or cva tenderness. No weakness, no joint deformities or effusions. Extremities: No clubbing, cyanosis. Trace edema. DP/PT/Radials 2+ and equal bilaterally. Neuro: Alert and oriented X 3. No focal deficits noted. Psych:  Good affect, responds appropriately Skin: No rashes or lesions noted.  Labs:   Lab Results  Component Value Date   WBC 2.4 (L) 07/19/2016   HGB 12.7 (L) 07/19/2016    HCT 39.0 07/19/2016   MCV 108.3 (H) 07/19/2016   PLT 50 (L) 07/19/2016   No results for input(s): INR in the last 72 hours.  Recent Labs Lab 07/19/16 0222  NA 138  K 3.4*  CL 108  CO2 21*  BUN 17  CREATININE 1.36*  CALCIUM 8.6*  GLUCOSE 94   Magnesium  Date Value Ref Range Status  07/19/2016 1.6 (L) 1.7 - 2.4 mg/dL Final   No results for input(s): CKTOTAL, CKMB, TROPONINI in the last 72 hours. No results for input(s): TROPIPOC in the last 72 hours. Pro B Natriuretic peptide (BNP)  Date/Time Value Ref Range Status  04/28/2016 02:00 PM 383.0 (H) 0.0 - 100.0 pg/mL Final  08/08/2014 11:15 AM 1,109.0 (H) 0 - 125 pg/mL Final   Lab Results  Component Value Date   CHOL 198 06/20/2016   HDL 62.40 06/20/2016   LDLCALC 77 12/04/2015   TRIG 485.0 (H) 06/20/2016   Lab Results  Component Value Date   DDIMER <0.27 06/04/2016   Lipase  Date/Time Value Ref Range Status  03/17/2014 02:17 PM 15 0 - 75 U/L Final   Amylase  Date/Time Value Ref Range Status  03/17/2014 02:17 PM 99 0 - 105 U/L Final   TSH  Date/Time Value Ref Range Status  06/20/2016 09:44 AM 3.47 0.35 - 4.50 uIU/mL Final   Vitamin B-12  Date/Time Value Ref Range Status  10/18/2013 09:43 AM 287 211 - 911 pg/mL Final    Comment:    Performed at [facility]   Folate  Date/Time Value Ref Range Status  10/18/2013 09:43 AM 9.6 ng/mL Final    Comment:    (NOTE) Reference Ranges        Deficient:       0.4 - 3.3 ng/mL        Indeterminate:   3.4 - 5.4 ng/mL        Normal:              > 5.4 ng/mL Performed at [facility]   Ferritin  Date/Time Value Ref Range Status  06/15/2015 02:14 PM 199 22 - 322 ng/mL Final   TIBC  Date/Time Value Ref Range Status  06/15/2015 02:14 PM 304 215 - 435 ug/dL Final   Iron  Date/Time Value Ref Range Status  06/15/2015 02:14 PM 84 42 - 165 ug/dL Final   Retic Ct Pct  Date/Time Value Ref Range Status  06/15/2015 02:14 PM 1.3 0.4 - 2.3 % Final     Echo: 04/15/16 LV EF: 55% -   60%  ------------------------------------------------------------------- Indications:      CHF - 428.0.  ------------------------------------------------------------------- History:   PMH:  Alcohol dependence.  Dyspnea.  Angina pectoris. Primary pulmonary hypertension.  Risk factors:  Hypertension.  ------------------------------------------------------------------- Study Conclusions  - Left ventricle: The cavity size was normal. There was moderate   focal basal hypertrophy of the septum. Systolic function was   normal. The estimated ejection fraction was in the range of 55%   to 60%. Wall motion was normal; there were no regional wall   motion abnormalities. Doppler parameters are consistent with   abnormal left ventricular relaxation (grade 1 diastolic   dysfunction). - Ventricular septum: The contour showed diastolic flattening. - Aortic valve: There was mild regurgitation. - Mitral valve: Calcified annulus. - Right ventricle: The cavity size was severely dilated. Wall   thickness was normal. Systolic function was severely reduced. - Pulmonary arteries: Systolic pressure was moderately increased.   PA peak pressure: 55 mm Hg (S).  Right/Left Heart Cath and Coronary Angiography 04/15/16  Conclusion    Mid RCA lesion, 20% stenosed.  Dist LAD lesion, 20% stenosed.   Findings:  Ao = 107/70 (87) LV =  106/3/11 RA =  4 RV = 74/11 PA = 74/26 (45) PCW = 6 Fick cardiac output/index = 4.6.2.2 PVR = 8.5 Ao sat = 95% PA sat = 61%, 58%  Assessment: 1. Minimal CAD 2. Moderate pulmonary HTN (improved from previous cath) 3. Normal left sided pressures after diuresis  Plan/Discussion:  Continue medical therapy. Hopefully discharge in am if renal function and access sites stable    ECG:  Sinus rhythm at rate of 79 bpm on EKG of 06/04/16  Radiology:  Dg Ribs Unilateral W/chest Right  Result Date: 07/19/2016 CLINICAL DATA:  Acute  onset of syncope and fall. Right lateral rib pain. Initial encounter. EXAM: RIGHT RIBS AND CHEST - 3+ VIEW COMPARISON:  Chest radiograph performed 06/04/2016 FINDINGS: No displaced rib fractures are seen. The lungs are well-aerated and clear. There is no evidence of focal opacification, pleural effusion or pneumothorax. The cardiomediastinal silhouette is mildly enlarged. No acute osseous abnormalities are seen. IMPRESSION: No displaced rib fracture seen. Lungs remain grossly clear. Mild cardiomegaly. Electronically Signed   By: Garald Balding M.D.   On: 07/19/2016 03:17    ASSESSMENT AND PLAN:      1. Syncope - Prior episode felt due to to dehydration 06/2016. This episode could be due to alcohol abuse (level 145). EKG not done this admission. Will get one. Continue to monitor on tele. ? Event monitor VS ILR. BP has been stable.       Hypertension   Hypokalemia   Hepatic cirrhosis (HCC)   PAH (pulmonary arterial hypertension) with portal hypertension (HCC)   Hypothyroidism   Right heart failure due to pulmonary hypertension (HCC)   Chronic renal insufficiency, stage III (moderate)   Pancytopenia (HCC)   Cirrhosis with alcoholism ([place])   Thrombocytopenia ([place])   SignedLeanor Kail, PA 07/19/2016, 10:57 AM Pager [phone]  Co-Sign MD  I have seen and examined the patient along with Bhagat,Bhavinkumar, PA.  I have reviewed the chart, notes and new data.  I agree with PA's note.  Key new complaints: feels well now Key examination changes: loud S2, otherwise normal CV exam Key new findings / data: mild hypokalemia. No arrhythmia on monitor. ECG with profound RVH. QT is prolonged (501 ms), but only slightly compared to baseline (484 ms in August, 491 ms in february), change likely related to hypokalemia.  PLAN: With normal LV function and no significant CAD, hemodynamically significant arrhythmia appears to be an unlikely cause for syncope. Outpatient event monitor is reasonable  in view of recurrent events, but suspect syncope may again have been due to hypotension. He is at higher risk of hypovolemia-induced hypotension secondary to increased pulmonary vascular resistance as well as treatment with diuretics and vasodilators. Diuretic effects of alcohol may have played a role. Right now BP is high. No other new recommendations.  Sanda Klein, MD, [place] [phone] 07/19/2016, 1:16 PM

## 2016-07-19 NOTE — H&P (Signed)
History and Physical  Patient Name: Darren Mueller     DGU:[id number]    DOB: August 30, 1951    DOA: 07/19/2016 PCP: Darren Homans, MD   Patient coming from: Home  Chief Complaint: Syncope  HPI: Darren Mueller is a 65 y.o. male with a past medical history significant for PAH, CHF EF 55%, HTN, and cirrhosis from alcohol who presents with syncope again.  The patient in his usual state of health this week without leg swelling, increasing weight, no swelling, or shortness of breath.  Tonight around midnight he was grilling dinner for himself and his son who was about to get off work when he brought the steaks into the kitchen, and suddenly passed out and woke up on the floor.  He hit the floor hard because his glasses had flown off, and he had pain in his right chest from where he landed.  There was no prodrome, chest pain, dizziness, palpitations preceding.  He was groggy after, and his son came home immediately after it happened and found him pale and rattled and so called 9-1-1.  ED course: -Afebrile, heart rate 70s, respirations 12, blood pressure 116/83, pulse oximetry normal -Na 138, K 3.4, Cr 1.36 (baseline 1.3-1.6), WBC 2.4K, Hgb 12.7, BNP 665 (up from baseline) -Radiograph of the ribs showed no fracture of the right side, nor pulmonary edema -TRH were asked to observe and consult cardiology  The patient was admitted in August for syncope. He had had an echo and right heart cath in June that showed EF 55% and normal CO but increased PVR despite therapy with macitentan, [name] and Adcirca with HF clinic.  At that time, his furosemide was decreased because he was orthostatic on exam and his verapamil was stopped, and he has had no further episodes and felt great.  Tonight, he admits to "two drinks at [employer]" earlier in the evening.      ROS: Review of Systems  Constitutional: Negative for chills and fever.  Respiratory: Negative for shortness of breath.   Cardiovascular: Positive for  chest pain (in right chest, after fall). Negative for orthopnea, leg swelling and PND.  Neurological: Positive for loss of consciousness. Negative for dizziness and seizures.  Psychiatric/Behavioral: Positive for substance abuse (alcohol tonight, 2 drinks).  All other systems reviewed and are negative.         Past Medical History:  Diagnosis Date  . Alcohol dependence ([place]) 03/22/2011   In remission   . Alcohol dependence in remission ([place]) 03/22/2011   In remission   . Alcoholic cirrhosis of liver with ascites ([place]) 2014  . Dermatitis 06/10/2015  . Diarrhea 09/04/2013  . GERD (gastroesophageal reflux disease)   . Gout   . Heart murmur   . Hx of colonic polyps   . Hypertension   . Hypopotassemia   . Otalgia 11/28/2013  . Other chronic pulmonary heart diseases   . Preventative health care 06/29/2016  . Red eye 03/03/2016   right  . Renal insufficiency 07/18/2013  . Seizures ([place])   . Sleep apnea    does not wear CPAP  . Thrombocytopenia ([place]) 06/29/2016  . Unspecified hypothyroidism 01/25/2013  . Unspecified pleural effusion     Past Surgical History:  Procedure Laterality Date  . CARDIAC CATHETERIZATION N/A 12/21/2015   Procedure: Right Heart Cath;  Surgeon: Jolaine Artist, MD;  Location: [facility];  Service: Cardiovascular;  Laterality: N/A;  . CARDIAC CATHETERIZATION N/A 04/15/2016   Procedure: Right/Left Heart Cath and Coronary Angiography;  Surgeon: Jolaine Artist, MD;  Location: [place] [facility];  Service: Cardiovascular;  Laterality: N/A;  . COLONOSCOPY N/A 12/08/2013   Procedure: COLONOSCOPY;  Surgeon: Milus Banister, MD;  Location: [facility];  Service: Endoscopy;  Laterality: N/A;  . ESOPHAGOGASTRODUODENOSCOPY (EGD) WITH PROPOFOL N/A 03/27/2016   Procedure: ESOPHAGOGASTRODUODENOSCOPY (EGD) WITH PROPOFOL;  Surgeon: Milus Banister, MD;  Location: [facility];  Service: Endoscopy;  Laterality: N/A;  . RIGHT HEART CATHETERIZATION Right 06/06/2014    Procedure: RIGHT HEART CATH;  Surgeon: Jolaine Artist, MD;  Location: [facility] CATH LAB;  Service: Cardiovascular;  Laterality: Right;  . UVULOPALATOPHARYNGOPLASTY  1999    Social History: Patient lives alone.  The patient walks unasssited.  He does not smoke.  He drinks "two drinks maximum" per day.    Allergies  Allergen Reactions  . Aspirin Other (See Comments)    hypertension    Family history: family history includes Heart attack in his mother; Heart disease in his mother; Hypertension in his mother; Prostate cancer in his father.  Prior to Admission medications   Medication Sig Start Date End Date Taking? Authorizing Provider  doxepin (SINEQUAN) 10 MG capsule TAKE 1 TO 2 CAPSULES AT BEDTIME 04/15/16  Yes Mosie Lukes, MD  furosemide (LASIX) 40 MG tablet Take 1 tablet (40 mg total) by mouth daily. Take as directed on Monday and Friday 06/06/16  Yes Orson Eva, MD  hyoscyamine (SYMAX-SR) 0.375 MG 12 hr tablet Take 0.375 mg by mouth daily.   Yes Historical Provider, MD  levothyroxine (SYNTHROID, LEVOTHROID) 25 MCG tablet TAKE 1 TABLET DAILY 07/13/16  Yes Mosie Lukes, MD  loperamide (ANTI-DIARRHEAL) 2 MG capsule Take 4 mg by mouth as needed for diarrhea or loose stools (up to 8 capsules per day).    Yes Historical Provider, MD  losartan (COZAAR) 50 MG tablet Take 50 mg by mouth daily.   Yes Historical Provider, MD  Macitentan (OPSUMIT) 10 MG TABS Take 10 mg by mouth daily. 03/23/14  Yes Shaune Pascal Bensimhon, MD  mometasone-formoterol (DULERA) 100-5 MCG/ACT AERO Inhale 2 puffs into the lungs daily.    Yes Historical Provider, MD  omeprazole (PRILOSEC) 40 MG capsule Take 1 capsule (40 mg total) by mouth daily. 12/04/15  Yes Mosie Lukes, MD  potassium chloride 20 MEQ TBCR Take 20 mEq by mouth daily. 04/16/16  Yes Ripudeep Krystal Eaton, MD  Selexipag (UPTRAVI) 1000 MCG TABS Take 1,000 mg by mouth every morning. 11/13/15  Yes Shaune Pascal Bensimhon, MD  Selexipag (UPTRAVI) 800 MCG TABS Take 1 tablet (800 mcg  total) by mouth every evening. 11/13/15  Yes Jolaine Artist, MD  spironolactone (ALDACTONE) 50 MG tablet TAKE 1 TABLET DAILY 06/01/16  Yes Mosie Lukes, MD  Tadalafil, PAH, (ADCIRCA) 20 MG TABS TAKE 2 TABLETS (40 MG TOTAL) DAILY 01/01/16  Yes Jolaine Artist, MD  albuterol (PROVENTIL HFA;VENTOLIN HFA) 108 (90 BASE) MCG/ACT inhaler Inhale 2 puffs into the lungs every 6 (six) hours as needed for wheezing or shortness of breath.    Historical Provider, MD  benzonatate (TESSALON) 100 MG capsule Take 1 capsule (100 mg total) by mouth 3 (three) times daily as needed. 04/28/16   Percell Miller Saguier, PA-C  Fluticasone Propionate 0.05 % LOTN Apply daily to affected area Patient taking differently: Apply 1 application topically daily. Apply daily to affected area 05/15/15   Mosie Lukes, MD       Physical Exam: BP 139/87   Pulse 80   Temp  97.7 F (36.5 C) (Oral)   Resp 21   SpO2 99%  General appearance: Well-developed, adult male, alert and in no acute distress.   Eyes: Anicteric, conjunctiva pink, lids and lashes normal. PERRL.    ENT: No nasal deformity, discharge, epistaxis.  Hearing normal. OP moist without lesions.   Neck: No neck masses.  Trachea midline.  No thyromegaly/tenderness. Lymph: No cervical or supraclavicular lymphadenopathy. Skin: Warm and dry.  No jaundice.  No suspicious rashes or lesions. Cardiac: RRR, nl S1-S2, no murmurs appreciated.  Capillary refill is brisk.  JVP normal.  No LE edema.  Radial and DP pulses 2+ and symmetric. Respiratory: Normal respiratory rate and rhythm.  CTAB without rales or wheezes. Abdomen: Abdomen soft.  No TTP. No ascites, distension, hepatosplenomegaly.   MSK: No deformities or effusions.  No cyanosis or clubbing.  Marked tenderness on right chest to palpation and to any movement. Neuro: Cranial nerves normal.  Sensation intact to light touch. Speech is fluent.  Muscle strength noraml.    Psych: Sensorium intact and responding to questions,  attention normal.  Behavior appropriate.  Affect normal.  Judgment and insight appear normal.     Labs on Admission:  I have personally reviewed following labs and imaging studies: CBC:  Recent Labs Lab 07/19/16 0222  WBC 2.4*  NEUTROABS 1.5*  HGB 12.7*  HCT 39.0  MCV 108.3*  PLT 50*   Basic Metabolic Panel:  Recent Labs Lab 07/19/16 0222  NA 138  K 3.4*  CL 108  CO2 21*  GLUCOSE 94  BUN 17  CREATININE 1.36*  CALCIUM 8.6*  MG 1.6*   GFR: CrCl cannot be calculated (Unknown ideal weight.).  BNP (last 3 results)  Recent Labs  04/28/16 1400  PROBNP 383.0*   HbA1C: No results for input(s): HGBA1C in the last 72 hours. CBG:  Recent Labs Lab 07/19/16 0335  GLUCAP 24         Radiological Exams on Admission: Personally reviewed: Dg Ribs Unilateral W/chest Right  Result Date: 07/19/2016 CLINICAL DATA:  Acute onset of syncope and fall. Right lateral rib pain. Initial encounter. EXAM: RIGHT RIBS AND CHEST - 3+ VIEW COMPARISON:  Chest radiograph performed 06/04/2016 FINDINGS: No displaced rib fractures are seen. The lungs are well-aerated and clear. There is no evidence of focal opacification, pleural effusion or pneumothorax. The cardiomediastinal silhouette is mildly enlarged. No acute osseous abnormalities are seen. IMPRESSION: No displaced rib fracture seen. Lungs remain grossly clear. Mild cardiomegaly. Electronically Signed   By: Garald Balding M.D.   On: 07/19/2016 03:17    EKG: Independently reviewed. Rate 80, QTc 480, ST depressions in anterior leads, identical to previous.  No new changes.  1st deg AVB.   Echocardiogram June 2017: EF 74-16% Grade I diastolic dysfunction Decreased RVSF  RHC June 2017: Minimal CAD  Ao = 107/70 (87) LV =  106/3/11 RA =  4 RV = 74/11 PA = 74/26 (45) PCW = 6 Fick cardiac output/index = 4.6.2.2 PVR = 8.5 Ao sat = 95% PA sat = 61%, 58%          Assessment/Plan 1. Syncope:  Differential includes  cardiogenic syncope, possibly orthostatic again, perhaps vagal this time (was drinking alcohol).  Doubt seizure.     -Check orthostatics -Replete K and Mag -Monitor on telemetry -Consult to Cardiology for possible event monitor, appreciate cares -Check ethanol level   2. Hypokalemia and hypomagnesemia:  Repleted  3. Chronic right heart failure and systolic heart failure:  Last  EF 55%.  Does not appear volume overloaded to me. -Continue Opsumit, Adcirca, and [name] -Consult to  -Continue spirinolactone, losartan -Continue furosemide MWF  4. Chest pain:  Likely bruise to ribs. -Morphine now -Tramadol   5. Hypothyroidism:  -Continue levothyroxine  6. GERD:  -Continue PPI  7. COPD:  Clinically stable. -Continue Dulera  8. Chronic diarrhea: -Continue loperamide  9. HTN: -Continue losartan, spironolactone         DVT prophylaxis: SCDs  Code Status: FULL  Family Communication: None present  Disposition Plan: Anticipate observation and consultation with Cardiology. Consults called: None overnight Admission status: OBS, tele    Medical decision making: Patient seen at 5:40 AM on 07/19/2016.  The patient was discussed with Dr. Kathrynn Humble.  What exists of the patient's chart was reviewed in depth and summarized above.  Clinical condition: stable.        Edwin Dada [facility] Pager [phone]

## 2016-07-19 NOTE — ED Triage Notes (Signed)
Py arrived to ED via EMS post syncopal episode with fall. Pt was found sitting in chair after fall breathing heavily. Pain report in right rib cage area. Tender to touch. EKG abnormalities reported.

## 2016-07-20 ENCOUNTER — Other Ambulatory Visit: Payer: Self-pay | Admitting: Cardiology

## 2016-07-20 ENCOUNTER — Observation Stay (HOSPITAL_COMMUNITY): Payer: Medicare Other

## 2016-07-20 DIAGNOSIS — S2231XA Fracture of one rib, right side, initial encounter for closed fracture: Secondary | ICD-10-CM

## 2016-07-20 DIAGNOSIS — I10 Essential (primary) hypertension: Secondary | ICD-10-CM | POA: Diagnosis not present

## 2016-07-20 DIAGNOSIS — R55 Syncope and collapse: Secondary | ICD-10-CM | POA: Diagnosis not present

## 2016-07-20 DIAGNOSIS — S4991XA Unspecified injury of right shoulder and upper arm, initial encounter: Secondary | ICD-10-CM | POA: Diagnosis not present

## 2016-07-20 DIAGNOSIS — M25511 Pain in right shoulder: Secondary | ICD-10-CM | POA: Diagnosis not present

## 2016-07-20 LAB — COMPREHENSIVE METABOLIC PANEL
ALBUMIN: 3.6 g/dL (ref 3.5–5.0)
ALT: 17 U/L (ref 17–63)
ANION GAP: 8 (ref 5–15)
AST: 40 U/L (ref 15–41)
Alkaline Phosphatase: 147 U/L — ABNORMAL HIGH (ref 38–126)
BILIRUBIN TOTAL: 1.4 mg/dL — AB (ref 0.3–1.2)
BUN: 16 mg/dL (ref 6–20)
CHLORIDE: 104 mmol/L (ref 101–111)
CO2: 24 mmol/L (ref 22–32)
Calcium: 9.2 mg/dL (ref 8.9–10.3)
Creatinine, Ser: 1.34 mg/dL — ABNORMAL HIGH (ref 0.61–1.24)
GFR calc Af Amer: >60 mL/min (ref >60)
GFR calc non Af Amer: 54 mL/min — ABNORMAL LOW (ref >60)
GLUCOSE: 92 mg/dL (ref 65–99)
POTASSIUM: 3.8 mmol/L (ref 3.5–5.1)
Sodium: 136 mmol/L (ref 135–145)
TOTAL PROTEIN: 7.2 g/dL (ref 6.5–8.1)

## 2016-07-20 LAB — GLUCOSE, CAPILLARY: GLUCOSE-CAPILLARY: 88 mg/dL (ref 65–99)

## 2016-07-20 LAB — CBC
HEMATOCRIT: 37.4 % — AB (ref 39.0–52.0)
HEMOGLOBIN: 12.2 g/dL — AB (ref 13.0–17.0)
MCH: 35.2 pg — ABNORMAL HIGH (ref 26.0–34.0)
MCHC: 32.6 g/dL (ref 30.0–36.0)
MCV: 107.8 fL — AB (ref 78.0–100.0)
Platelets: 46 10*3/uL — ABNORMAL LOW (ref 150–400)
RBC: 3.47 MIL/uL — AB (ref 4.22–5.81)
RDW: 15.4 % (ref 11.5–15.5)
WBC: 2 10*3/uL — AB (ref 4.0–10.5)

## 2016-07-20 MED ORDER — METHOCARBAMOL 500 MG PO TABS
750.0000 mg | ORAL_TABLET | Freq: Three times a day (TID) | ORAL | Status: DC
  Administered 2016-07-20: 750 mg via ORAL
  Filled 2016-07-20: qty 2

## 2016-07-20 MED ORDER — ADULT MULTIVITAMIN W/MINERALS CH: 1.0000 | ORAL_TABLET | Freq: Every day | ORAL | Status: DC

## 2016-07-20 MED ORDER — TRAMADOL HCL 50 MG PO TABS: 50.0000 mg | ORAL_TABLET | Freq: Two times a day (BID) | ORAL | 0 refills | Status: DC | PRN

## 2016-07-20 MED ORDER — METHOCARBAMOL 750 MG PO TABS: 750.0000 mg | ORAL_TABLET | Freq: Three times a day (TID) | ORAL | 0 refills | Status: DC

## 2016-07-20 NOTE — Progress Notes (Signed)
Arrangements mad for outpatient event monitor and follow-up. Please let us know when he is ready for DC.

## 2016-07-20 NOTE — Plan of Care (Signed)
Problem: Pain Managment: Goal: General experience of comfort will improve Outcome: Progressing Decrease pain if not moving much; pain med given.

## 2016-07-20 NOTE — Discharge Summary (Signed)
Physician Discharge Summary  Darren Mueller ZOX:[id number] DOB: Oct 03, 1951 DOA: 07/19/2016  PCP: Penni Homans, MD  Admit date: 07/19/2016 Discharge date: 07/20/2016  Admitted From: Home Disposition:  Home  Recommendations for Outpatient Follow-up:  1. Follow up with PCP in 1-2 weeks Home Health:no Equipment/Devices:spirometer  Discharge Condition:stable CODE STATUS:full Diet recommendation:heart healthy  Brief/Interim Summary:65 year old male with past medical history of hypertension, alcohol dependence, liver cirrhosis, thrombocytopenia, etc. reflux, hypothyroidism, right ventricular failure presented with recurrent syncopal episode.  The patient was in his usual state of health this week without leg swelling, increasing weight, no swelling, or shortness of breath. He was grilling dinner for himself and his son who was about to get off work when he brought the steaks into the kitchen, and suddenly passed out and woke up on the floor.  He hit the floor hard because his glasses had flown off, and he had pain in his right chest from where he landed.  There was no prodrome, chest pain, dizziness, palpitations preceding.  He was groggy after, and his son came home immediately after it happened and found him pale and rattled and so called 9-1-1. The patient was admitted in August for syncope. He had had an echo and right heart cath in June that showed EF 55% and normal CO but increased PVR despite therapy with macitentan, [name] and Adcirca with HF clinic.  At that time, his furosemide was decreased because he was orthostatic on exam and his verapamil was stopped, and he has had no further episodes and felt great.  On admission patient had elevated alcohol level. He was afebrile, blood pressure and vitals acceptable. Syncopal episode thought to be related with possible dehydration in the setting of alcohol use. Patient was evaluated by cardiologist. Patient had no orthostatic hypotension. EKG  unremarkable. Cardiology plan to do even monitor as an outpatient.   The patient was continually complaining of right-sided chest pain. Chest x-ray unremarkable. Today, we repeated right shoulder x-ray which was consistent with mildly displaced right 6th rib fracture. Patient has no bruises or hematoma. I discussed the x-ray finding with cardiothoracic surgeon Dr Nils Pyle. He recommended pain management and no further intervention needed. I added Robaxin on top of tramadol for the pain management. I educated the patient to take Tylenol as needed. I advised patient to take deep breath and use spirometer at home. In the hospital he is breathing okay with acceptable oxygen saturation in room air.  He has no headache, dizziness, tremor or nausea, vomiting, abdominal pain, shortness of breath, cough. He has no left-sided chest pain. He is able to tolerate diet well. He is ambulating. We monitored for possible local withdrawal. No sign of withdrawal noted.  Also discussed with the cardiologist Dr. Meda Coffee. Patient is being discharged home in stable condition. I encouraged him to follow up with his cardiologist and primary care doctor. Patient verbalizes understanding.  Discharge Diagnoses:  Principal Problem:   Syncope Active Problems:   Hypertension   Hypokalemia   Hepatic cirrhosis (HCC)   PAH (pulmonary arterial hypertension) with portal hypertension (HCC)   Hypothyroidism   Right heart failure due to pulmonary hypertension (HCC)   Chronic renal insufficiency, stage III (moderate)   Pancytopenia (HCC)   Cirrhosis with alcoholism ([place])   Thrombocytopenia ([place])    Discharge Instructions  Discharge Instructions    (HEART FAILURE PATIENTS) Call MD:  Anytime you have any of the following symptoms: 1) 3 pound weight gain in 24 hours or 5 pounds in 1 week  2) shortness of breath, with or without a dry hacking cough 3) swelling in the hands, feet or stomach 4) if you have to sleep on extra pillows  at night in order to breathe.    Complete by:  As directed    Call MD for:  difficulty breathing, headache or visual disturbances    Complete by:  As directed    Call MD for:  temperature >100.4    Complete by:  As directed    Diet - low sodium heart healthy    Complete by:  As directed    Discharge instructions    Complete by:  As directed    Please don't drive after taking pain medication. Please take breath and use spirometer.   Incentive spirometry RT    Complete by:  As directed    Increase activity slowly    Complete by:  As directed        Medication List    TAKE these medications   albuterol 108 (90 Base) MCG/ACT inhaler Commonly known as:  PROVENTIL HFA;VENTOLIN HFA Inhale 2 puffs into the lungs every 6 (six) hours as needed for wheezing or shortness of breath.   ANTI-DIARRHEAL 2 MG capsule Generic drug:  loperamide Take 4 mg by mouth as needed for diarrhea or loose stools (up to 8 capsules per day).   doxepin 10 MG capsule Commonly known as:  SINEQUAN TAKE 1 TO 2 CAPSULES AT BEDTIME   Fluticasone Propionate 0.05 % Lotn Apply daily to affected area What changed:  how much to take  how to take this  when to take this  additional instructions   furosemide 40 MG tablet Commonly known as:  LASIX Take 1 tablet (40 mg total) by mouth daily. Take as directed on Monday and Friday   levothyroxine 25 MCG tablet Commonly known as:  SYNTHROID, LEVOTHROID TAKE 1 TABLET DAILY   losartan 50 MG tablet Commonly known as:  COZAAR Take 50 mg by mouth daily.   Macitentan 10 MG Tabs Commonly known as:  OPSUMIT Take 10 mg by mouth daily.   methocarbamol 750 MG tablet Commonly known as:  ROBAXIN Take 1 tablet (750 mg total) by mouth 3 (three) times daily.   mometasone-formoterol 100-5 MCG/ACT Aero Commonly known as:  DULERA Inhale 2 puffs into the lungs daily.   multivitamin with minerals Tabs tablet Take 1 tablet by mouth daily. Start taking on:  07/21/2016    omeprazole 40 MG capsule Commonly known as:  PRILOSEC Take 1 capsule (40 mg total) by mouth daily.   Potassium Chloride ER 20 MEQ Tbcr Take 20 mEq by mouth daily.   Selexipag 800 MCG Tabs Commonly known as:  UPTRAVI Take 1 tablet (800 mcg total) by mouth every evening.   Selexipag 1000 MCG Tabs Commonly known as:  UPTRAVI Take 1,000 mg by mouth every morning.   spironolactone 50 MG tablet Commonly known as:  ALDACTONE TAKE 1 TABLET DAILY   SYMAX-SR 0.375 MG 12 hr tablet Generic drug:  hyoscyamine Take 0.375 mg by mouth daily.   tadalafil (PAH) 20 MG tablet Commonly known as:  ADCIRCA TAKE 2 TABLETS (40 MG TOTAL) DAILY   traMADol 50 MG tablet Commonly known as:  ULTRAM Take 1 tablet (50 mg total) by mouth every 12 (twelve) hours as needed for moderate pain or severe pain.      Follow-up Information    [facility] .   Specialty:  Cardiology Why:  our office will call  you Monday to arrange outpatient monitor, please call us if you do not hear from [place] by Tuesday AM. Contact information: [address] [place]       Glori Bickers, MD .   Specialty:  Cardiology Why:  the office will call with date and time Contact information: [address] [place] [address] [phone]        Penni Homans, MD. Schedule an appointment as soon as possible for a visit in 1 week(s).   Specialty:  Family Medicine Contact information: [address] [place] [address] [phone]          Allergies  Allergen Reactions  . Aspirin Other (See Comments)    hypertension    Consultations:  Cardiologist.   Procedures/Studies: Dg Ribs Unilateral W/chest Right  Result Date: 07/19/2016 CLINICAL DATA:  Acute onset of syncope and fall. Right lateral rib pain. Initial encounter. EXAM: RIGHT RIBS AND CHEST - 3+ VIEW COMPARISON:  Chest radiograph performed  06/04/2016 FINDINGS: No displaced rib fractures are seen. The lungs are well-aerated and clear. There is no evidence of focal opacification, pleural effusion or pneumothorax. The cardiomediastinal silhouette is mildly enlarged. No acute osseous abnormalities are seen. IMPRESSION: No displaced rib fracture seen. Lungs remain grossly clear. Mild cardiomegaly. Electronically Signed   By: Garald Balding M.D.   On: 07/19/2016 03:17   Dg Shoulder Right  Result Date: 07/20/2016 CLINICAL DATA:  Patient passed out and fell on shoulder 2 days ago. Pain. EXAM: RIGHT SHOULDER - 2+ VIEW COMPARISON:  Chest radiograph with rib detail, 07/19/2016 FINDINGS: There is no evidence of glenohumeral fracture or dislocation. Soft tissues are unremarkable. Mild AC joint degenerative change. Along the RIGHT chest wall, there is a mildly displaced 6th rib fracture (arrow). In retrospect, this can be seen on the prior chest with RIGHT ribs, but is much better visualized on today's radiograph. IMPRESSION: Mildly displaced RIGHT sixth rib fracture. No shoulder fracture or dislocation. These results will be called to the ordering clinician or representative by the Radiologist Assistant, and communication documented in the PACS or zVision Dashboard. Electronically Signed   By: Staci Righter M.D.   On: 07/20/2016 10:10   Subjective: The patient was seen and examined at bedside. Patient reported right chest pain and tenderness. Denied headache, dizziness, nausea, vomiting, and left-sided chest pain, shortness of breath or cough.   Discharge Exam: Vitals:   07/20/16 0535 07/20/16 0800  BP: (!) 150/98 138/80  Pulse: 89 73  Resp:    Temp:  97.6 F (36.4 C)   Vitals:   07/19/16 2327 07/20/16 0350 07/20/16 0535 07/20/16 0800  BP: (!) 153/84 (!) 123/98 (!) 150/98 138/80  Pulse: 70 74 89 73  Resp: 20 18    Temp: 97.8 F (36.6 C) 98.2 F (36.8 C)  97.6 F (36.4 C)  TempSrc: Oral Oral  Oral  SpO2: 99% 100%  97%  Weight:  89.2 kg  (196 lb 11.2 oz)    Height:        General: Pleasant male lying on bed, not in distress. Cardiovascular: RRR, S1/S2 +, no rubs, no gallops Respiratory: CTA bilaterally, no wheezing, no rhonchi. Breathing comfortably. Abdominal: Soft, NT, ND, bowel sounds + Extremities: no edema, no cyanosis. No tremor noted. Right chest: Has tenderness. No bruises or ecchymosis.   The results of significant diagnostics from this hospitalization (including imaging, microbiology, ancillary and laboratory) are listed below for reference.  Microbiology: No results found for this or any previous visit (from the past 240 hour(s)).   Labs: BNP (last 3 results)  Recent Labs  04/14/16 0149 06/04/16 0820 07/19/16 0223  BNP 543.9* 171.9* 300.9*   Basic Metabolic Panel:  Recent Labs Lab 07/19/16 0222 07/20/16 0311  NA 138 136  K 3.4* 3.8  CL 108 104  CO2 21* 24  GLUCOSE 94 92  BUN 17 16  CREATININE 1.36* 1.34*  CALCIUM 8.6* 9.2  MG 1.6*  --    Liver Function Tests:  Recent Labs Lab 07/20/16 0311  AST 40  ALT 17  ALKPHOS 147*  BILITOT 1.4*  PROT 7.2  ALBUMIN 3.6   No results for input(s): LIPASE, AMYLASE in the last 168 hours. No results for input(s): AMMONIA in the last 168 hours. CBC:  Recent Labs Lab 07/19/16 0222 07/20/16 0311  WBC 2.4* 2.0*  NEUTROABS 1.5*  --   HGB 12.7* 12.2*  HCT 39.0 37.4*  MCV 108.3* 107.8*  PLT 50* 46*   Cardiac Enzymes: No results for input(s): CKTOTAL, CKMB, CKMBINDEX, TROPONINI in the last 168 hours. BNP: Invalid input(s): POCBNP CBG:  Recent Labs Lab 07/19/16 0335 07/20/16 0634  GLUCAP 85 88   D-Dimer No results for input(s): DDIMER in the last 72 hours. Hgb A1c No results for input(s): HGBA1C in the last 72 hours. Lipid Profile No results for input(s): CHOL, HDL, LDLCALC, TRIG, CHOLHDL, LDLDIRECT in the last 72 hours. Thyroid function studies No results for input(s): TSH, T4TOTAL, T3FREE, THYROIDAB in the last 72  hours.  Invalid input(s): FREET3 Anemia work up No results for input(s): VITAMINB12, FOLATE, FERRITIN, TIBC, IRON, RETICCTPCT in the last 72 hours. Urinalysis    Component Value Date/Time   COLORURINE YELLOW 10/18/2013 1247   APPEARANCEUR CLEAR 10/18/2013 1247   LABSPEC 1.017 10/18/2013 1247   PHURINE 5.0 10/18/2013 1247   GLUCOSEU NEGATIVE 10/18/2013 1247   HGBUR NEGATIVE 10/18/2013 [place] 10/18/2013 1247   KETONESUR NEGATIVE 10/18/2013 1247   PROTEINUR NEGATIVE 10/18/2013 1247   UROBILINOGEN 0.2 10/18/2013 1247   NITRITE NEGATIVE 10/18/2013 1247   LEUKOCYTESUR NEGATIVE 10/18/2013 1247   Sepsis Labs Invalid input(s): PROCALCITONIN,  WBC,  LACTICIDVEN Microbiology No results found for this or any previous visit (from the past 240 hour(s)).   Time coordinating discharge: Over 30 minutes  SIGNED:   Rosita Fire, MD  [facility] 07/20/2016, 11:55 AM Pager   If 7PM-7AM, please contact night-coverage [email or web address] Password [email or web address]

## 2016-07-20 NOTE — Discharge Summary (Signed)
Pt got discharged to home, discharge instructions provided and patient showed understanding to it, IV taken out,Telemonitor DC,pt left unit in wheelchair with all of the belongings accompanied with a family member (Sister)

## 2016-07-21 ENCOUNTER — Telehealth: Payer: Self-pay | Admitting: Behavioral Health

## 2016-07-21 MED FILL — METHOCARBAMOL 750 MG TABLET: 750 | 30 days supply | Qty: 30 | Fill #0

## 2016-07-21 MED FILL — SM COMPLETE MULTI-VIT-MINER: 100 days supply | Qty: 100 | Fill #0

## 2016-07-21 MED FILL — traMADol HCL 50 MG TABS: 50 | 5 days supply | Qty: 10 | Fill #0

## 2016-07-21 NOTE — Telephone Encounter (Signed)
Transition Care Management Follow-up Telephone Call  PCP: Penni Homans, MD  Admit date: 07/19/2016 Discharge date: 07/20/2016  Admitted From: Home Disposition:  Home  Recommendations for Outpatient Follow-up:  1. Follow up with PCP in 1-2 weeks   How have you been since you were released from the hospital? Patient stated, "I'm having some pain from the bruised rib after the fall".    Do you understand why you were in the hospital? yes   Do you understand the discharge instructions? yes   Where were you discharged to? Home   Items Reviewed:  Medications reviewed: yes  Allergies reviewed: yes  Dietary changes reviewed: yes, heart healthy  Referrals reviewed: yes, follow-up with PCP and cardiologist.   Functional Questionnaire:   Activities of Daily Living (ADLs):   He states they are independent in the following: ambulation, bathing and hygiene, feeding, continence, grooming, toileting and dressing States they require assistance with the following: None   Any transportation issues/concerns?: no   Any patient concerns? yes, patient voiced that pain is his main concern, but he will be picking up his prescription today at the pharmacy.   Confirmed importance and date/time of follow-up visits scheduled yes, 07/22/16 at 11:30 AM.  Provider Appointment booked with Mackie Pai, PA-C.  Confirmed with patient if condition begins to worsen call PCP or go to the ER.  Patient was given the office number and encouraged to call back with question or concerns.  : yes

## 2016-07-22 ENCOUNTER — Telehealth: Payer: Self-pay | Admitting: Emergency Medicine

## 2016-07-22 ENCOUNTER — Encounter: Payer: Self-pay | Admitting: Medical

## 2016-07-22 ENCOUNTER — Ambulatory Visit (INDEPENDENT_AMBULATORY_CARE_PROVIDER_SITE_OTHER): Payer: Medicare Other | Admitting: Medical

## 2016-07-22 ENCOUNTER — Ambulatory Visit (HOSPITAL_BASED_OUTPATIENT_CLINIC_OR_DEPARTMENT_OTHER)
Admission: RE | Admit: 2016-07-22 | Discharge: 2016-07-22 | Disposition: A | Discharge status: Home / Self Care | Payer: Medicare Other | Source: Ambulatory Visit | Attending: Medical | Admitting: Medical

## 2016-07-22 VITALS — BP 112/70 | HR 90 | Temp 97.7°F | Ht 74.0 in | Wt 202.0 lb

## 2016-07-22 DIAGNOSIS — I509 Heart failure, unspecified: Secondary | ICD-10-CM | POA: Diagnosis not present

## 2016-07-22 DIAGNOSIS — I2729 Other secondary pulmonary hypertension: Secondary | ICD-10-CM

## 2016-07-22 DIAGNOSIS — R0781 Pleurodynia: Secondary | ICD-10-CM | POA: Diagnosis not present

## 2016-07-22 DIAGNOSIS — N183 Chronic kidney disease, stage 3 unspecified: Secondary | ICD-10-CM

## 2016-07-22 DIAGNOSIS — I50810 Right heart failure, unspecified: Secondary | ICD-10-CM

## 2016-07-22 DIAGNOSIS — S2241XA Multiple fractures of ribs, right side, initial encounter for closed fracture: Secondary | ICD-10-CM | POA: Diagnosis not present

## 2016-07-22 DIAGNOSIS — I272 Other secondary pulmonary hypertension: Secondary | ICD-10-CM

## 2016-07-22 DIAGNOSIS — I517 Cardiomegaly: Secondary | ICD-10-CM | POA: Insufficient documentation

## 2016-07-22 DIAGNOSIS — N189 Chronic kidney disease, unspecified: Secondary | ICD-10-CM

## 2016-07-22 DIAGNOSIS — E876 Hypokalemia: Secondary | ICD-10-CM | POA: Diagnosis not present

## 2016-07-22 DIAGNOSIS — D696 Thrombocytopenia, unspecified: Secondary | ICD-10-CM | POA: Diagnosis not present

## 2016-07-22 DIAGNOSIS — I700 Atherosclerosis of aorta: Secondary | ICD-10-CM | POA: Diagnosis not present

## 2016-07-22 DIAGNOSIS — E860 Dehydration: Secondary | ICD-10-CM

## 2016-07-22 LAB — COMPREHENSIVE METABOLIC PANEL
ALT: 15 U/L (ref 0–53)
AST: 37 U/L (ref 0–37)
Albumin: 3.7 g/dL (ref 3.5–5.2)
Alkaline Phosphatase: 160 U/L — ABNORMAL HIGH (ref 39–117)
BUN: 20 mg/dL (ref 6–23)
CHLORIDE: 103 meq/L (ref 96–112)
CO2: 27 meq/L (ref 19–32)
CREATININE: 1.49 mg/dL (ref 0.40–1.50)
Calcium: 8.6 mg/dL (ref 8.4–10.5)
GFR: 60.6 mL/min (ref >60.00)
Glucose, Bld: 89 mg/dL (ref 70–99)
POTASSIUM: 3.7 meq/L (ref 3.5–5.1)
Sodium: 136 mEq/L (ref 135–145)
Total Bilirubin: 1 mg/dL (ref 0.2–1.2)
Total Protein: 7.3 g/dL (ref 6.0–8.3)

## 2016-07-22 LAB — BRAIN NATRIURETIC PEPTIDE: Pro B Natriuretic peptide (BNP): 327 pg/mL — ABNORMAL HIGH (ref 0.0–100.0)

## 2016-07-22 LAB — CBC WITH DIFFERENTIAL/PLATELET
BASOS ABS: 0 10*3/uL (ref 0.0–0.1)
Basophils Relative: 0.5 % (ref 0.0–3.0)
Eosinophils Absolute: 0 10*3/uL (ref 0.0–0.7)
Eosinophils Relative: 1.4 % (ref 0.0–5.0)
HCT: 39.2 % (ref 39.0–52.0)
Hemoglobin: 13.2 g/dL (ref 13.0–17.0)
LYMPHS ABS: 0.8 10*3/uL (ref 0.7–4.0)
Lymphocytes Relative: 34.4 % (ref 12.0–46.0)
MCHC: 33.8 g/dL (ref 30.0–36.0)
MCV: 106.5 fl — AB (ref 78.0–100.0)
MONOS PCT: 14.2 % — AB (ref 3.0–12.0)
Monocytes Absolute: 0.3 10*3/uL (ref 0.1–1.0)
NEUTROS ABS: 1.2 10*3/uL — AB (ref 1.4–7.7)
NEUTROS PCT: 49.5 % (ref 43.0–77.0)
RBC: 3.68 Mil/uL — ABNORMAL LOW (ref 4.22–5.81)
RDW: 16.4 % — ABNORMAL HIGH (ref 11.5–15.5)
WBC: 2.4 10*3/uL — ABNORMAL LOW (ref 4.0–10.5)

## 2016-07-22 MED ORDER — HYDROCODONE-ACETAMINOPHEN 5-325 MG PO TABS: 1.0000 | ORAL_TABLET | Freq: Four times a day (QID) | ORAL | 0 refills | Status: DC | PRN

## 2016-07-22 MED FILL — HYDROCODON-APAP 5-325: 5-325 | 5 days supply | Qty: 20 | Fill #0

## 2016-07-22 NOTE — Patient Instructions (Addendum)
For your rib pain/fracture I am prescribing norco limited number. Extremely important to stop tramadol and not use any alcohol at all while on norco.  For renal insufficiency will repeat cmp.  For low platelets will repeat cbc.  Very important not to drink alcohol as combination of diuretic and alcohol may have played role leading to dehydration maybe a factor in syncope. Also alcohol will continue to effect liver and platelets.Marland Kitchen  Keep appointment to get monitor placed by cardiologist. This is important in light of your heart hx and recent syncope  Follow up 7 days or as needed

## 2016-07-22 NOTE — Progress Notes (Signed)
Subjective:    Patient ID: Darren Mueller, male    DOB: 03-Mar-1951, 65 y.o.   MRN: [id number]  HPI  Pt in for follow up on hospital admission. He had syncope while he was cooking for his son. Pt states no cardiac or neurologic signs or symptoms precdinet. He stated occurred randomly. Pt states this is his 3rd event passing out. Pt on admission had elevated alcohol level. Syncope was thought to be related dehydration associated with alcohol use.   Also thought was maybe bp med related. Per pt they decreased his lasix to just on Monday and Friday. They also stopped verapamil.   Pt had bruised rib from fall. No fracture.but states moderate pain when he moves.  He states he was discharged from hospital this Sunday.     Below is ED note summary 65 year old male with past medical history of hypertension, alcohol dependence, liver cirrhosis, thrombocytopenia, etc. reflux, hypothyroidism, right ventricular failure presented with recurrent syncopal episode.  The patient was in his usual state of health this week without leg swelling, increasing weight, no swelling, or shortness of breath. He was grilling dinner for himself and his son who was about to get off work when he brought the steaks into the kitchen, and suddenly passed out and woke up on the floor. He hit the floor hard because his glasses had flown off, and he had pain in his right chest from where he landed. There was no prodrome, chest pain, dizziness, palpitations preceding. He was groggy after, and his son came home immediately after it happened and found him pale and rattled and so called 9-1-1. The patient was admitted in August for syncope. He had had an echo and right heart cath in June that showed EF 55% and normal CO but increased PVR despite therapy with macitentan, [name] and Adcirca with HF clinic. At that time, his furosemide was decreased because he was orthostatic on exam and his verapamil was stopped, and he has had no  further episodes and felt great.  On admission patient had elevated alcohol level. He was afebrile, blood pressure and vitals acceptable. Syncopal episode thought to be related with possible dehydration in the setting of alcohol use. Patient was evaluated by cardiologist. Patient had no orthostatic hypotension. EKG unremarkable. Cardiology plan to do even monitor as an outpatient.   The patient was continually complaining of right-sided chest pain. Chest x-ray unremarkable. Today, we repeated right shoulder x-ray which was consistent with mildly displaced right 6th rib fracture. Patient has no bruises or hematoma. I discussed the x-ray finding with cardiothoracic surgeon Dr Nils Pyle. He recommended pain management and no further intervention needed. I added Robaxin on top of tramadol for the pain management. I educated the patient to take Tylenol as needed. I advised patient to take deep breath and use spirometer at home. In the hospital he is breathing okay with acceptable oxygen saturation in room air.  He has no headache, dizziness, tremor or nausea, vomiting, abdominal pain, shortness of breath, cough. He has no left-sided chest pain. He is able to tolerate diet well. He is ambulating. We monitored for possible local withdrawal. No sign of withdrawal noted.  Also discussed with the cardiologist Dr. Meda Coffee. Patient is being discharged home in stable condition. I encouraged him to follow up with his cardiologist and primary care doctor. Patient verbalizes understanding.  Pt already talked to cardiology clinic to get heart monitor.    Discharge Diagnoses:  Principal Problem:   Syncope Active Problems:  Hypertension   Hypokalemia   Hepatic cirrhosis (HCC)   PAH (pulmonary arterial hypertension) with portal hypertension (HCC)   Hypothyroidism   Right heart failure due to pulmonary hypertension (HCC)   Chronic renal insufficiency, stage III (moderate)   Pancytopenia (HCC)   Cirrhosis  with alcoholism (HCC)   Thrombocytopenia (HCC)       Review of Systems  Constitutional: Negative for appetite change, chills, fatigue and fever.  HENT: Negative for congestion.   Respiratory: Negative for cough, chest tightness, shortness of breath and wheezing.   Cardiovascular: Negative for chest pain and palpitations.  Gastrointestinal: Negative for abdominal pain, constipation, diarrhea and nausea.  Genitourinary: Negative for dysuria and frequency.  Musculoskeletal: Negative for back pain, joint swelling, myalgias and neck stiffness.       Rt upper anterior chest/rib area pain.  Skin: Negative for rash.  Neurological: Negative for dizziness, speech difficulty, weakness, light-headedness and headaches.  Hematological: Negative for adenopathy. Does not bruise/bleed easily.  Psychiatric/Behavioral: Negative for behavioral problems, confusion and hallucinations. The patient is not nervous/anxious.     Past Medical History:  Diagnosis Date  . Alcohol dependence ([place]) 03/22/2011   In remission   . Alcohol dependence in remission ([place]) 03/22/2011   In remission   . Alcoholic cirrhosis of liver with ascites ([place]) 2014  . Dermatitis 06/10/2015  . Diarrhea 09/04/2013  . GERD (gastroesophageal reflux disease)   . Gout   . Heart murmur   . Hx of colonic polyps   . Hypertension   . Hypopotassemia   . Otalgia 11/28/2013  . Other chronic pulmonary heart diseases   . Preventative health care 06/29/2016  . Red eye 03/03/2016   right  . Renal insufficiency 07/18/2013  . Seizures ([place])   . Sleep apnea    does not wear CPAP  . Thrombocytopenia ([place]) 06/29/2016  . Unspecified hypothyroidism 01/25/2013  . Unspecified pleural effusion      Social History   Social History  . Marital status: Divorced    Spouse name: N/A  . Number of children: 3  . Years of education: N/A   Occupational History  . Retired     Social research officer, government   Social History Main Topics  . Smoking status: Former Smoker     Packs/day: 2.00    Years: 5.00    Types: Cigarettes    Quit date: 09/22/1979  . Smokeless tobacco: Never Used  . Alcohol use 3.6 oz/week    6 Shots of liquor per week     Comment: 06/04/2016 "2 vodka & tonics on MWF"  . Drug use: No  . Sexual activity: Not Currently   Other Topics Concern  . Not on file   Social History Narrative   0 caffeine drinks daily     Past Surgical History:  Procedure Laterality Date  . CARDIAC CATHETERIZATION N/A 12/21/2015   Procedure: Right Heart Cath;  Surgeon: Jolaine Artist, MD;  Location: [facility];  Service: Cardiovascular;  Laterality: N/A;  . CARDIAC CATHETERIZATION N/A 04/15/2016   Procedure: Right/Left Heart Cath and Coronary Angiography;  Surgeon: Jolaine Artist, MD;  Location: [facility];  Service: Cardiovascular;  Laterality: N/A;  . COLONOSCOPY N/A 12/08/2013   Procedure: COLONOSCOPY;  Surgeon: Milus Banister, MD;  Location: [facility];  Service: Endoscopy;  Laterality: N/A;  . ESOPHAGOGASTRODUODENOSCOPY (EGD) WITH PROPOFOL N/A 03/27/2016   Procedure: ESOPHAGOGASTRODUODENOSCOPY (EGD) WITH PROPOFOL;  Surgeon: Milus Banister, MD;  Location: [facility];  Service: Endoscopy;  Laterality: N/A;  . RIGHT HEART CATHETERIZATION Right 06/06/2014   Procedure: RIGHT HEART CATH;  Surgeon: Jolaine Artist, MD;  Location: [facility] CATH LAB;  Service: Cardiovascular;  Laterality: Right;  . UVULOPALATOPHARYNGOPLASTY  1999    Family History  Problem Relation Age of Onset  . Heart disease Mother   . Heart attack Mother   . Hypertension Mother   . Prostate cancer Father   . Colon cancer Neg Hx     Allergies  Allergen Reactions  . Aspirin Other (See Comments)    hypertension    Current Outpatient Prescriptions on File Prior to Visit  Medication Sig Dispense Refill  . albuterol (PROVENTIL HFA;VENTOLIN HFA) 108 (90 BASE) MCG/ACT inhaler Inhale 2 puffs into the lungs every 6 (six) hours as needed for wheezing or shortness of  breath.    . doxepin (SINEQUAN) 10 MG capsule TAKE 1 TO 2 CAPSULES AT BEDTIME 180 capsule 1  . Fluticasone Propionate 0.05 % LOTN Apply daily to affected area (Patient taking differently: Apply 1 application topically daily. Apply daily to affected area) 3 Bottle 3  . furosemide (LASIX) 40 MG tablet Take 1 tablet (40 mg total) by mouth daily. Take as directed on Monday and Friday 30 tablet 3  . hyoscyamine (SYMAX-SR) 0.375 MG 12 hr tablet Take 0.375 mg by mouth daily.    Marland Kitchen levothyroxine (SYNTHROID, LEVOTHROID) 25 MCG tablet TAKE 1 TABLET DAILY 90 tablet 1  . loperamide (ANTI-DIARRHEAL) 2 MG capsule Take 4 mg by mouth as needed for diarrhea or loose stools (up to 8 capsules per day).     Marland Kitchen losartan (COZAAR) 50 MG tablet Take 50 mg by mouth daily.    . Macitentan (OPSUMIT) 10 MG TABS Take 10 mg by mouth daily. 90 tablet 3  . methocarbamol (ROBAXIN) 750 MG tablet Take 1 tablet (750 mg total) by mouth 3 (three) times daily. 30 tablet 0  . mometasone-formoterol (DULERA) 100-5 MCG/ACT AERO Inhale 2 puffs into the lungs daily.     . Multiple Vitamin (MULTIVITAMIN WITH MINERALS) TABS tablet Take 1 tablet by mouth daily.    Marland Kitchen omeprazole (PRILOSEC) 40 MG capsule Take 1 capsule (40 mg total) by mouth daily. 90 capsule 2  . potassium chloride 20 MEQ TBCR Take 20 mEq by mouth daily. 30 tablet 3  . Selexipag (UPTRAVI) 1000 MCG TABS Take 1,000 mg by mouth every morning. 90 tablet 2  . Selexipag (UPTRAVI) 800 MCG TABS Take 1 tablet (800 mcg total) by mouth every evening. 90 tablet 2  . spironolactone (ALDACTONE) 50 MG tablet TAKE 1 TABLET DAILY 90 tablet 0  . Tadalafil, PAH, (ADCIRCA) 20 MG TABS TAKE 2 TABLETS (40 MG TOTAL) DAILY 180 tablet 2  . traMADol (ULTRAM) 50 MG tablet Take 1 tablet (50 mg total) by mouth every 12 (twelve) hours as needed for moderate pain or severe pain. 10 tablet 0   No current facility-administered medications on file prior to visit.     BP 112/70   Pulse 90   Temp 97.7 F (36.5  C) (Oral)   Ht _0  (1.88 m)   Wt 202 lb (91.6 kg)   SpO2 98%   BMI 25.94 kg/m       Objective:   Physical Exam   General Mental Status- Alert. General Appearance- Not in acute distress.   Skin General: Color- Normal Color. Moisture- Normal Moisture.  Neck Carotid Arteries- Normal color. Moisture- Normal Moisture. No carotid bruits. No JVD.  Chest and Lung Exam  Auscultation: Breath Sounds:-Normal.  Cardiovascular Auscultation:Rythm- Regular. Murmurs & Other Heart Sounds:Auscultation of the heart reveals- No Murmurs.  Abdomen Inspection:-Inspeection Normal. Palpation/Percussion:Note:No mass. Palpation and Percussion of the abdomen reveal- Non Tender, Non Distended + BS, no rebound or guarding.  Anterior thorax- rt upper rib area tender to palpation. No bruising. No creptis over ribs. No palpatlbe abnormality.   Neurologic Cranial Nerve exam:- CN III-XII intact(No nystagmus), symmetric smile. Strength:- 5/5 equal and symmetric strength both upper and lower extremities.     Assessment & Plan:  For your rib/fracture  pain I am prescribing norco limited number. Extremely important to stop tramadol and not use any alcohol at all while on norco. Pt verbalized understanding.  For renal insufficiency will repeat cmp.  For low platelets will repeat cbc.  Very important not to drink alcohol as combination of diuretic and alcohol may have played role leading to dehydration maybe a factor in syncope. Also alcohol will continue to effect liver and platelets.  Keep appointment to get monitor placed by cardiologist. This is important in light of your heart hx and recent syncope  Follow up 7 days or as needed  Not rib fracture seen on shoulder xray view.

## 2016-07-22 NOTE — Telephone Encounter (Signed)
Santiago Glad from [facility] called Critical Plt Count of 44,000 on this pt.

## 2016-07-22 NOTE — Telephone Encounter (Signed)
Would you give me update after you speak with pt.

## 2016-07-23 NOTE — Telephone Encounter (Signed)
Rib fracture can take 6-8 weeks to heal. We will give pain meds. He is currently on norco. In about 3 weeks may be able to stop norco and then give less potent medication such as tramadol. He has tramadol. He is not taking tramadol presently. But advise him to keep is stored apart from norco. He may need that in near future.  Does not need ortho appointment for rib fracture.

## 2016-07-23 NOTE — Telephone Encounter (Signed)
Called patient with lab results and results of XR. States he is having no blood in stools or coughing up blood. Would like ro know how he is supposed to manage Rib Fx. Is he to see  Ortho?

## 2016-07-24 ENCOUNTER — Ambulatory Visit: Payer: Medicare Other | Admitting: Medical

## 2016-07-24 NOTE — Telephone Encounter (Signed)
Will you call pt and ask him to come in tomorrow or early next week. Would like to repeat his cbc. Recheck platelets.

## 2016-07-24 NOTE — Telephone Encounter (Signed)
Tiffany---please call patient and schedule a follow up appointment with Percell Miller.

## 2016-07-24 NOTE — Telephone Encounter (Signed)
Patient scheduled for 8:15am 07/25/16.

## 2016-07-25 ENCOUNTER — Encounter: Payer: Self-pay | Admitting: Medical

## 2016-07-25 ENCOUNTER — Ambulatory Visit (INDEPENDENT_AMBULATORY_CARE_PROVIDER_SITE_OTHER): Payer: Medicare Other | Admitting: Medical

## 2016-07-25 ENCOUNTER — Telehealth: Payer: Self-pay | Admitting: *Deleted

## 2016-07-25 ENCOUNTER — Inpatient Hospital Stay: Payer: Medicare Other | Admitting: Medical

## 2016-07-25 VITALS — BP 140/80 | HR 102 | Temp 98.2°F | Ht 74.0 in | Wt 203.4 lb

## 2016-07-25 DIAGNOSIS — K7030 Alcoholic cirrhosis of liver without ascites: Secondary | ICD-10-CM | POA: Diagnosis not present

## 2016-07-25 DIAGNOSIS — D696 Thrombocytopenia, unspecified: Secondary | ICD-10-CM

## 2016-07-25 DIAGNOSIS — S2249XS Multiple fractures of ribs, unspecified side, sequela: Secondary | ICD-10-CM

## 2016-07-25 LAB — CBC WITH DIFFERENTIAL/PLATELET
BASOS ABS: 0 10*3/uL (ref 0.0–0.1)
Basophils Relative: 0.3 % (ref 0.0–3.0)
EOS ABS: 0 10*3/uL (ref 0.0–0.7)
Eosinophils Relative: 1 % (ref 0.0–5.0)
HEMATOCRIT: 37.3 % — AB (ref 39.0–52.0)
Hemoglobin: 12.7 g/dL — ABNORMAL LOW (ref 13.0–17.0)
LYMPHS PCT: 37.4 % (ref 12.0–46.0)
Lymphs Abs: 0.8 10*3/uL (ref 0.7–4.0)
MCHC: 34 g/dL (ref 30.0–36.0)
MCV: 105.8 fl — ABNORMAL HIGH (ref 78.0–100.0)
MONO ABS: 0.3 10*3/uL (ref 0.1–1.0)
Monocytes Relative: 14.7 % — ABNORMAL HIGH (ref 3.0–12.0)
Neutro Abs: 1 10*3/uL — ABNORMAL LOW (ref 1.4–7.7)
Neutrophils Relative %: 46.6 % (ref 43.0–77.0)
Platelets: 45 10*3/uL — CL (ref 150.0–400.0)
RBC: 3.53 Mil/uL — AB (ref 4.22–5.81)
RDW: 15.9 % — ABNORMAL HIGH (ref 11.5–15.5)
WBC: 2 10*3/uL — ABNORMAL LOW (ref 4.0–10.5)

## 2016-07-25 NOTE — Progress Notes (Signed)
Pre visit review using our clinic tool,if applicable. No additional management support is needed unless otherwise documented below in the visit note.

## 2016-07-25 NOTE — Patient Instructions (Addendum)
I want to repeat your cbc/platetlets today as approach the weekend. Any signs of bleeding as discussed then ED evaluation.  Advise not alcohol again as explained before.  For rib fracture rx norco will refill when needed early next week.  Follow up as regularly scheduled with pcp or as needed with myself.

## 2016-07-25 NOTE — Progress Notes (Signed)
Subjective:    Patient ID: Darren Mueller, male    DOB: December 12, 1950, 65 y.o.   MRN: [id number]  HPI   Pt in for follow up. Pt has known low platelets history related to underlying cirrhosis. Saw him just recently.  Pt has no nose bleeds, no diffuse bruising, no blood or back stools.   Pt still had rt rib pain from prior fracture.   Pt states feels fine today. No concerns.  Pt bp initially elevated and checked with machine. No cardiac or neurologic signs or symptoms.    Review of Systems  Constitutional: Negative for chills, fatigue and fever.  HENT: Negative for congestion.   Respiratory: Negative for cough, chest tightness, shortness of breath and wheezing.   Cardiovascular: Negative for chest pain and palpitations.  Gastrointestinal: Negative for abdominal pain.  Musculoskeletal: Negative for back pain.  Skin: Negative for rash.  Neurological: Negative for dizziness, speech difficulty, weakness, numbness and headaches.  Hematological: Negative for adenopathy. Does not bruise/bleed easily.  Psychiatric/Behavioral: Negative for behavioral problems and confusion.     Past Medical History:  Diagnosis Date  . Alcohol dependence (HCC) 03/22/2011   In remission   . Alcohol dependence in remission (HCC) 03/22/2011   In remission   . Alcoholic cirrhosis of liver with ascites (HCC) 2014  . Dermatitis 06/10/2015  . Diarrhea 09/04/2013  . GERD (gastroesophageal reflux disease)   . Gout   . Heart murmur   . Hx of colonic polyps   . Hypertension   . Hypopotassemia   . Otalgia 11/28/2013  . Other chronic pulmonary heart diseases   . Preventative health care 06/29/2016  . Red eye 03/03/2016   right  . Renal insufficiency 07/18/2013  . Seizures (HCC)   . Sleep apnea    does not wear CPAP  . Thrombocytopenia (HCC) 06/29/2016  . Unspecified hypothyroidism 01/25/2013  . Unspecified pleural effusion      Social History   Social History  . Marital status: Divorced    Spouse name:  N/A  . Number of children: 3  . Years of education: N/A   Occupational History  . Retired     Company secretary   Social History Main Topics  . Smoking status: Former Smoker    Packs/day: 2.00    Years: 5.00    Types: Cigarettes    Quit date: 09/22/1979  . Smokeless tobacco: Never Used  . Alcohol use 3.6 oz/week    6 Shots of liquor per week     Comment: 06/04/2016 "2 vodka & tonics on MWF"  . Drug use: No  . Sexual activity: Not Currently   Other Topics Concern  . Not on file   Social History Narrative   0 caffeine drinks daily     Past Surgical History:  Procedure Laterality Date  . CARDIAC CATHETERIZATION N/A 12/21/2015   Procedure: Right Heart Cath;  Surgeon: Dolores Patty, MD;  Location: [facility] INVASIVE [facility] LAB;  Service: Cardiovascular;  Laterality: N/A;  . CARDIAC CATHETERIZATION N/A 04/15/2016   Procedure: Right/Left Heart Cath and Coronary Angiography;  Surgeon: Dolores Patty, MD;  Location: [facility] INVASIVE CV LAB;  Service: Cardiovascular;  Laterality: N/A;  . COLONOSCOPY N/A 12/08/2013   Procedure: COLONOSCOPY;  Surgeon: Rachael Fee, MD;  Location: [facility];  Service: Endoscopy;  Laterality: N/A;  . ESOPHAGOGASTRODUODENOSCOPY (EGD) WITH PROPOFOL N/A 03/27/2016   Procedure: ESOPHAGOGASTRODUODENOSCOPY (EGD) WITH PROPOFOL;  Surgeon: Rachael Fee, MD;  Location[facility];  Service: Endoscopy;  Laterality:  N/A;  . RIGHT HEART CATHETERIZATION Right 06/06/2014   Procedure: RIGHT HEART CATH;  Surgeon: Dolores Patty, MD;  Location: [facility] CATH LAB;  Service: Cardiovascular;  Laterality: Right;  . UVULOPALATOPHARYNGOPLASTY  1999    Family History  Problem Relation Age of Onset  . Heart disease Mother   . Heart attack Mother   . Hypertension Mother   . Prostate cancer Father   . Colon cancer Neg Hx     Allergies  Allergen Reactions  . Aspirin Other (See Comments)    hypertension    Current Outpatient Prescriptions on File Prior to Visit  Medication Sig  Dispense Refill  . albuterol (PROVENTIL HFA;VENTOLIN HFA) 108 (90 BASE) MCG/ACT inhaler Inhale 2 puffs into the lungs every 6 (six) hours as needed for wheezing or shortness of breath.    . doxepin (SINEQUAN) 10 MG capsule TAKE 1 TO 2 CAPSULES AT BEDTIME 180 capsule 1  . Fluticasone Propionate 0.05 % LOTN Apply daily to affected area (Patient taking differently: Apply 1 application topically daily. Apply daily to affected area) 3 Bottle 3  . furosemide (LASIX) 40 MG tablet Take 1 tablet (40 mg total) by mouth daily. Take as directed on Monday and Friday 30 tablet 3  . HYDROcodone-acetaminophen (NORCO) 5-325 MG tablet Take 1 tablet by mouth every 6 (six) hours as needed for moderate pain. 20 tablet 0  . hyoscyamine (SYMAX-SR) 0.375 MG 12 hr tablet Take 0.375 mg by mouth daily.    Marland Kitchen levothyroxine (SYNTHROID, LEVOTHROID) 25 MCG tablet TAKE 1 TABLET DAILY 90 tablet 1  . loperamide (ANTI-DIARRHEAL) 2 MG capsule Take 4 mg by mouth as needed for diarrhea or loose stools (up to 8 capsules per day).     Marland Kitchen losartan (COZAAR) 50 MG tablet Take 50 mg by mouth daily.    . Macitentan (OPSUMIT) 10 MG TABS Take 10 mg by mouth daily. 90 tablet 3  . methocarbamol (ROBAXIN) 750 MG tablet Take 1 tablet (750 mg total) by mouth 3 (three) times daily. 30 tablet 0  . mometasone-formoterol (DULERA) 100-5 MCG/ACT AERO Inhale 2 puffs into the lungs daily.     . Multiple Vitamin (MULTIVITAMIN WITH MINERALS) TABS tablet Take 1 tablet by mouth daily.    Marland Kitchen omeprazole (PRILOSEC) 40 MG capsule Take 1 capsule (40 mg total) by mouth daily. 90 capsule 2  . potassium chloride 20 MEQ TBCR Take 20 mEq by mouth daily. 30 tablet 3  . Selexipag (UPTRAVI) 1000 MCG TABS Take 1,000 mg by mouth every morning. 90 tablet 2  . Selexipag (UPTRAVI) 800 MCG TABS Take 1 tablet (800 mcg total) by mouth every evening. 90 tablet 2  . spironolactone (ALDACTONE) 50 MG tablet TAKE 1 TABLET DAILY 90 tablet 0  . Tadalafil, PAH, (ADCIRCA) 20 MG TABS TAKE 2  TABLETS (40 MG TOTAL) DAILY 180 tablet 2  . traMADol (ULTRAM) 50 MG tablet Take 1 tablet (50 mg total) by mouth every 12 (twelve) hours as needed for moderate pain or severe pain. 10 tablet 0   No current facility-administered medications on file prior to visit.     BP (!) 152/80   Pulse (!) 102   Temp 98.2 F (36.8 C) (Oral)   Ht 6\' 2"  (1.88 m)   Wt 203 lb 6.4 oz (92.3 kg)   SpO2 96%   BMI 26.12 kg/m      Objective:   Physical Exam  General Mental Status- Alert. General Appearance- Not in acute distress.   Skin General:  Color- Normal Color. Moisture- Normal Moisture.  Neck Carotid Arteries- Normal color. Moisture- Normal Moisture. No carotid bruits. No JVD.  Chest and Lung Exam Auscultation: Breath Sounds:-Normal.  Cardiovascular Auscultation:Rythm- Regular. Murmurs & Other Heart Sounds:Auscultation of the heart reveals- No Murmurs.  Abdomen Inspection:-Inspeection Normal. Palpation/Percussion:Note:No mass. Palpation and Percussion of the abdomen reveal- Non Tender, Non Distended + BS, no rebound or guarding.    Neurologic Cranial Nerve exam:- CN III-XII intact(No nystagmus), symmetric smile. Strength:- 5/5 equal and symmetric strength both upper and lower extremities.  skinn no bruising of the skin.      Assessment & Plan:  I want to repeat your cbc/platetlets today as approach the weekend. Any signs of bleeding as discussed then ED evaluation.  Advise no alcohol again as explained before.  For rib fracture rx norco will refill when needed early next week.  Follow up as regularly scheduled with pcp or as needed with myself.  Pt will only be on norco for 6 weeks or so. But will likely get to sign contract  Lelah Rennaker, [place], [place]

## 2016-07-28 NOTE — Telephone Encounter (Signed)
Patient called regarding lab results. Please advise.   Patient Phone:[phone] Patient Relation: Self

## 2016-07-29 ENCOUNTER — Encounter (HOSPITAL_COMMUNITY): Payer: Medicare Other | Admitting: Internal Medicine

## 2016-07-29 ENCOUNTER — Ambulatory Visit (INDEPENDENT_AMBULATORY_CARE_PROVIDER_SITE_OTHER): Payer: Medicare Other

## 2016-07-29 DIAGNOSIS — R55 Syncope and collapse: Secondary | ICD-10-CM

## 2016-07-31 NOTE — Telephone Encounter (Signed)
Spoke with patient regarding lab results.

## 2016-08-21 ENCOUNTER — Ambulatory Visit: Payer: Medicare Other | Admitting: Family Medicine

## 2016-08-30 ENCOUNTER — Other Ambulatory Visit: Payer: Self-pay | Admitting: Family Medicine

## 2016-09-10 ENCOUNTER — Ambulatory Visit (HOSPITAL_COMMUNITY)
Admission: RE | Admit: 2016-09-10 | Discharge: 2016-09-10 | Disposition: A | Discharge status: Home / Self Care | Payer: Medicare Other | Source: Ambulatory Visit | Attending: Internal Medicine | Admitting: Internal Medicine

## 2016-09-10 VITALS — BP 142/86 | HR 81 | Wt 207.8 lb

## 2016-09-10 DIAGNOSIS — I10 Essential (primary) hypertension: Secondary | ICD-10-CM

## 2016-09-10 DIAGNOSIS — I2721 Secondary pulmonary arterial hypertension: Secondary | ICD-10-CM

## 2016-09-10 DIAGNOSIS — I2729 Other secondary pulmonary hypertension: Secondary | ICD-10-CM | POA: Diagnosis not present

## 2016-09-10 DIAGNOSIS — R55 Syncope and collapse: Secondary | ICD-10-CM | POA: Diagnosis not present

## 2016-09-10 DIAGNOSIS — N183 Chronic kidney disease, stage 3 unspecified: Secondary | ICD-10-CM

## 2016-09-10 DIAGNOSIS — K766 Portal hypertension: Secondary | ICD-10-CM

## 2016-09-10 DIAGNOSIS — K7030 Alcoholic cirrhosis of liver without ascites: Secondary | ICD-10-CM

## 2016-09-10 DIAGNOSIS — I50810 Right heart failure, unspecified: Secondary | ICD-10-CM

## 2016-09-10 NOTE — Progress Notes (Signed)
ADVANCED HF CLINIC NOTE  Patient ID: Darren Mueller, male   DOB: [birth year], 65 y.o.   MRN: [id number] GI : Dr Ardis Hughs PCP: Dr Charlett Blake  Subjective:    Darren Mueller ("[name]") is a 65 yo with history of COPD, portopulmonary hypertension with RV failure, and ETOH cirrhosis presents for followup of his PAH.  Admitted in 6/17 with CP and SOB. Was diuresed. Troponins normal. Underwent R/L cath. Minimal CAD with moderate PAH and preserved cardiac output.   Admitted 06/2016 and 07/2016 with syncope.  On September admission found to have elevated ETOH level as well as R 6th rib fracture.   Wore 30 day monitory up until 08/28/16. No AF noted. No events.  9% tachycardia.   He presents today for regular follow up. Weight up 5 lbs from visit in August.  Feeling good overall. No further syncope since September.  Still cant walk around [employer] without having to stop and take a break.  Is up to selexipag 1000 BID. No worsening HAs or flushing.  Remains on Macitentan and Adcirca. Weight at home remains stable around 202-203 lbs. Still drinking. Drinks 1 drink on Wednesday and Friday each.   6MW 06/2016 1320 feet   PAH meds 1) Macitnentan 2) Adcirca 3) Selexapeg  Studies:  ECHO 12/14 EF 55-60% Peak PA pressure 35. Severe RV dysfunction  ECHO 7/15 EF 60% RV moderately to severely dilated. Moderate HK RVSP 9m HG  ECHO 6/16 EF 60% RV moderately to severely dilated. Severe HK RVSP ~65 mm HG. D-shaped septum Echo 2/17 LVEF 60-65% RV massively dilated. Flat septum. Severe HK. Moderate TR RVSP ~65. IVC small. No effusion   Cath 6/17  Mid RCA lesion, 20% stenosed. Dist LAD lesion, 20% stenosed.  Ao = 107/70 (87) LV = 106/3/11 RA = 4 RV = 74/11 PA = 74/26 (45) PCW = 6 Fick cardiac output/index = 4.6.2.2 PVR = 8.5 Ao sat = 95% PA sat = 61%, 58%  RHC 2/17 RA = 11 RV = 80/16/19 PA = 83/61 (71) PCW = 9 Fick cardiac output/index = 5.6/2.6 PVR = 10.3 WU Ao sat = 94% PA sat = 65%,  68% High SVC sat = 65%  RHC 2/14: RA = 13  RV = 63/5/14  PA = 65/24 (41)  PCW = 6  Hepatic wedge = 21  Fick cardiac output/index = 3.8/1.8  PVR = 9.1  FA sat = 94%  PA sat = 58%, 56%  SVC = 54%  RHC 06/06/14 RA = 5  RV = 73/5/9  PA = 81/25 (46)  PCW = 7  Fick cardiac output/index = 6.0/2.8  PVR = 6.5 [place]  FA sat = 95%  PA sat = 71%, 67%  Unable to get hepatic wedge  PFTs 3/14 FEV1 2.02 L (58%) FVC 2.7L (54%) FEV1/FVC (89%) DLCO 53%  PFTs 1/17 FEV1 2.05 L (61%) FVC 2.48L (56%) DLCO 44%   Event Monitor - NSR 10/26/13.   Ab u/s 8/16: + cirrhosis/mild to moderate splenomegaly  6 min walk 01/10/13, 1290 feet  6MW (01/10/14) = 1280 feet (380 m) 6MW (9/15) = 1350 feet (411 m) O2 sats ranged from 89-95% on room air, HR ranged from 100-129. 6MW (6/16) = 384 meters  6MW (2/17) = 1250 feet (3882m6MW (8/17) = 1320 feet   Labs:  10/19/13 Creatinine 1.79 K 4.1  3/15        Cr 1.78 K 4.2 03/17/14    CR 1.23 K 4.0 LFTs ok 06/06/14 K 4.0  Creatinine 1.4  08/08/14 K 4.0 Cr 1.6 09/12/14 K 4.3 Cr 1.8 LFTs ok, HCT 58 12/15 K 4.3 Cr 1.15  06/2015 K 3.6, Cr 1.5 1/17  K 3.7 Cr 1.29 AST 75 ALT 33 Albumin 4.0 Bili 1.4 Wbc 2.2 hgb 13.5 PLT 61k 04/28/16  K 4.3 Cr 1.39 Hgb 12.9 PLT 44 BNP 383   Current Outpatient Prescriptions on File Prior to Encounter  Medication Sig Dispense Refill  . albuterol (PROVENTIL HFA;VENTOLIN HFA) 108 (90 BASE) MCG/ACT inhaler Inhale 2 puffs into the lungs every 6 (six) hours as needed for wheezing or shortness of breath.    . doxepin (SINEQUAN) 10 MG capsule TAKE 1 TO 2 CAPSULES AT BEDTIME 180 capsule 1  . Fluticasone Propionate 0.05 % LOTN Apply daily to affected area (Patient taking differently: Apply 1 application topically daily. Apply daily to affected area) 3 Bottle 3  . furosemide (LASIX) 40 MG tablet Take 1 tablet (40 mg total) by mouth daily. Take as directed on Monday and Friday 30 tablet 3  . levothyroxine (SYNTHROID, LEVOTHROID) 25 MCG tablet  TAKE 1 TABLET DAILY 90 tablet 1  . loperamide (ANTI-DIARRHEAL) 2 MG capsule Take 4 mg by mouth as needed for diarrhea or loose stools (up to 8 capsules per day).     Marland Kitchen losartan (COZAAR) 50 MG tablet Take 50 mg by mouth daily.    . Macitentan (OPSUMIT) 10 MG TABS Take 10 mg by mouth daily. 90 tablet 3  . mometasone-formoterol (DULERA) 100-5 MCG/ACT AERO Inhale 2 puffs into the lungs daily.     . Multiple Vitamin (MULTIVITAMIN WITH MINERALS) TABS tablet Take 1 tablet by mouth daily.    Marland Kitchen omeprazole (PRILOSEC) 40 MG capsule TAKE 1 CAPSULE DAILY 90 capsule 2  . Selexipag (UPTRAVI) 1000 MCG TABS Take 1,000 mg by mouth every morning. 90 tablet 2  . Selexipag (UPTRAVI) 800 MCG TABS Take 1 tablet (800 mcg total) by mouth every evening. 90 tablet 2  . spironolactone (ALDACTONE) 50 MG tablet TAKE 1 TABLET DAILY 90 tablet 0  . SYMAX-SR 0.375 MG 12 hr tablet TAKE 1 TABLET TWICE A DAY 180 tablet 1  . Tadalafil, PAH, (ADCIRCA) 20 MG TABS TAKE 2 TABLETS (40 MG TOTAL) DAILY 180 tablet 2  . HYDROcodone-acetaminophen (NORCO) 5-325 MG tablet Take 1 tablet by mouth every 6 (six) hours as needed for moderate pain. (Patient not taking: Reported on 09/10/2016) 20 tablet 0  . methocarbamol (ROBAXIN) 750 MG tablet Take 1 tablet (750 mg total) by mouth 3 (three) times daily. (Patient not taking: Reported on 09/10/2016) 30 tablet 0  . traMADol (ULTRAM) 50 MG tablet Take 1 tablet (50 mg total) by mouth every 12 (twelve) hours as needed for moderate pain or severe pain. (Patient not taking: Reported on 09/10/2016) 10 tablet 0   No current facility-administered medications on file prior to encounter.     Objective:   Weight Range:  Vital Signs:   Pulse Rate:  [81] 81 (11/08 1042) BP: (142)/(86) 142/86 (11/08 1042) SpO2:  [98 %] 98 % (11/08 1042) Weight:  [207 lb 12.8 oz (94.3 kg)] 207 lb 12.8 oz (94.3 kg) (11/08 1042)    Wt Readings from Last 3 Encounters:  09/10/16 207 lb 12.8 oz (94.3 kg)  07/25/16 203 lb 6.4 oz  (92.3 kg)  07/22/16 202 lb (91.6 kg)    Physical Exam: General: NAD, facial flushing.  Neck: JVP ~8-9  cm  no thyromegaly or thyroid nodule.  Lungs: CTAB, normal effort. CV:  Nondisplaced PMI. Heart regular  2/6 TR P2 LLSB. No carotid bruit.   Abdomen: Soft, NT, ND, no HSM. No bruits or masses. +BS  Ext: warm, No clubbing, 1+ edema.Normal pedal pulses. Skin: Intact without lesions or rashes.  Neurologic: Alert and oriented x 3.  Psych: Normal affect.  Extremities: No clubbing or cyanosis.  HEENT: Normal. Except for erythematous rash/flushing   Assessment/Plan    1. Syncope - No further syncope since September.  - 30 day event monitor unremarkable.  2. PAH: Suspected portopulmonary HTN. - Remains on triple therapy. Recent RHC with slightly improved hemodynamics  - Persistent NYHA III symptoms. 6MW stable, refuses walking today.  - Continue macitentan 10 daily, adcirca 40 mg daily, selexipag 1000 BID.  - Will continue current therapy. Not candidate for IV therapies at this point but will closely for decompensation.  - Again stressed need to stop ETOH completely.   - Volume status relatively stable. Can take extra lasix as needed.  3. CKD III:  - Stable on recent labs. Sees. PCP tomorrow.  4. Cirrhosis:  - Likely combination of RV failure an ETOH - As above, again recommended complete cessation of ETOH.  - Follows with Dr. Ardis Hughs.  5. HTN:  - Mildly elevated in clinic today. Usually stable. Will not change meds today.  6. OSA:  -Has mild OSA with AHI 12 and desats down to 82%.  - Intolerant CPAP.  - Have encouraged to use a pulse ox and use 02 prn to keep sats > 90% 7. Pancytopenia:  - Progressive. Sees Dr. Charlett Blake tomorrow.  Has also seen Dr. Marin Olp in Hematology as well as Dr. Alen Blew.  - ? Due to cirrhosis vs bone marrow suppression vs PAH meds. Work up unrevealing so far per patient.    Sees PCP tomorrow and can get labs then.  3 month follow up.   Shirley Friar, PA-C 11:17 AM  Patient seen and examined with Oda Kilts, PA-C. We discussed all aspects of the encounter. I agree with the assessment and plan as stated above.   Overall stable on triple therapy for PAH. Recent RHC reviewed with him. Volume status ok. Continue to titrate selexepag as tolerated to goal 1600 bid. Reinforced need to abstain from ETOH as much as possible. Continue to follow with GI and Hematology for cirrhosis and pancytopenia.   Petina Muraski,MD 8:18 PM

## 2016-09-10 NOTE — Patient Instructions (Signed)
We will contact you in 3 months to schedule your next appointment.  

## 2016-09-11 ENCOUNTER — Telehealth: Payer: Self-pay | Admitting: Emergency Medicine

## 2016-09-11 ENCOUNTER — Ambulatory Visit (INDEPENDENT_AMBULATORY_CARE_PROVIDER_SITE_OTHER): Payer: Medicare Other | Admitting: Family Medicine

## 2016-09-11 ENCOUNTER — Encounter: Payer: Self-pay | Admitting: Family Medicine

## 2016-09-11 VITALS — BP 124/70 | HR 88 | Temp 97.8°F | Ht 72.0 in | Wt 206.5 lb

## 2016-09-11 DIAGNOSIS — Z23 Encounter for immunization: Secondary | ICD-10-CM | POA: Diagnosis not present

## 2016-09-11 DIAGNOSIS — D696 Thrombocytopenia, unspecified: Secondary | ICD-10-CM

## 2016-09-11 DIAGNOSIS — R197 Diarrhea, unspecified: Secondary | ICD-10-CM

## 2016-09-11 DIAGNOSIS — G4700 Insomnia, unspecified: Secondary | ICD-10-CM

## 2016-09-11 DIAGNOSIS — I10 Essential (primary) hypertension: Secondary | ICD-10-CM | POA: Diagnosis not present

## 2016-09-11 LAB — MAGNESIUM: Magnesium: 1.6 mg/dL (ref 1.5–2.5)

## 2016-09-11 LAB — COMPREHENSIVE METABOLIC PANEL
ALK PHOS: 166 U/L — AB (ref 39–117)
ALT: 22 U/L (ref 0–53)
AST: 48 U/L — ABNORMAL HIGH (ref 0–37)
Albumin: 4.1 g/dL (ref 3.5–5.2)
BILIRUBIN TOTAL: 0.9 mg/dL (ref 0.2–1.2)
BUN: 24 mg/dL — ABNORMAL HIGH (ref 6–23)
CALCIUM: 9.3 mg/dL (ref 8.4–10.5)
CO2: 24 meq/L (ref 19–32)
Chloride: 107 mEq/L (ref 96–112)
Creatinine, Ser: 1.36 mg/dL (ref 0.40–1.50)
GFR: 67.3 mL/min (ref >60.00)
GLUCOSE: 97 mg/dL (ref 70–99)
POTASSIUM: 4.1 meq/L (ref 3.5–5.1)
Sodium: 138 mEq/L (ref 135–145)
Total Protein: 7.7 g/dL (ref 6.0–8.3)

## 2016-09-11 LAB — CBC WITH DIFFERENTIAL/PLATELET
BASOS ABS: 0 10*3/uL (ref 0.0–0.1)
Basophils Relative: 0.7 % (ref 0.0–3.0)
EOS PCT: 0.8 % (ref 0.0–5.0)
Eosinophils Absolute: 0 10*3/uL (ref 0.0–0.7)
HEMATOCRIT: 38.5 % — AB (ref 39.0–52.0)
Hemoglobin: 12.9 g/dL — ABNORMAL LOW (ref 13.0–17.0)
LYMPHS ABS: 0.6 10*3/uL — AB (ref 0.7–4.0)
LYMPHS PCT: 34.9 % (ref 12.0–46.0)
MCHC: 33.5 g/dL (ref 30.0–36.0)
MCV: 103.9 fl — AB (ref 78.0–100.0)
MONOS PCT: 13.2 % — AB (ref 3.0–12.0)
Monocytes Absolute: 0.2 10*3/uL (ref 0.1–1.0)
NEUTROS ABS: 0.9 10*3/uL — AB (ref 1.4–7.7)
NEUTROS PCT: 50.4 % (ref 43.0–77.0)
Platelets: 33 10*3/uL — CL (ref 150.0–400.0)
RBC: 3.71 Mil/uL — AB (ref 4.22–5.81)
RDW: 14.4 % (ref 11.5–15.5)
WBC: 1.8 10*3/uL — CL (ref 4.0–10.5)

## 2016-09-11 NOTE — Assessment & Plan Note (Signed)
Well controlled, no changes to meds. Encouraged heart healthy diet such as the DASH diet and exercise as tolerated.

## 2016-09-11 NOTE — Telephone Encounter (Signed)
Santiago Glad from [facility] called critical labs 1.8 white count & 33,000 platelet count.

## 2016-09-11 NOTE — Telephone Encounter (Signed)
Dr. Charlett Blake is aware of results.

## 2016-09-11 NOTE — Progress Notes (Signed)
Pre visit review using our clinic review tool, if applicable. No additional management support is needed unless otherwise documented below in the visit note.

## 2016-09-11 NOTE — Patient Instructions (Signed)

## 2016-09-12 ENCOUNTER — Other Ambulatory Visit: Payer: Self-pay | Admitting: Family Medicine

## 2016-09-12 DIAGNOSIS — D72829 Elevated white blood cell count, unspecified: Secondary | ICD-10-CM

## 2016-09-14 NOTE — Progress Notes (Signed)
Patient ID: Darren Mueller, male   DOB: 11-10-1950, 65 y.o.   MRN: [id number]   Subjective:    Patient ID: Darren Mueller, male    DOB: 1951-07-01, 65 y.o.   MRN: [id number]  Chief Complaint  Patient presents with  . Follow-up    HPI Patient is in today for follow up. He is feeling well today. No recent hospitalizations. He is given a flu shot today. Notes ongoing concerns with loose stool daily. No bloody or tarry stool and Imodium does help. He has cut down on his alcohol consumption but unfortunately he does continue to drink alcohol. Denies CP/palp/HA/congestion/fevers/GI or GU c/o. Taking meds as prescribed  Past Medical History:  Diagnosis Date  . Alcohol dependence ([place]) 03/22/2011   In remission   . Alcohol dependence in remission ([place]) 03/22/2011   In remission   . Alcoholic cirrhosis of liver with ascites ([place]) 2014  . Dermatitis 06/10/2015  . Diarrhea 09/04/2013  . GERD (gastroesophageal reflux disease)   . Gout   . Heart murmur   . Hx of colonic polyps   . Hypertension   . Hypopotassemia   . Otalgia 11/28/2013  . Other chronic pulmonary heart diseases   . Preventative health care 06/29/2016  . Red eye 03/03/2016   right  . Renal insufficiency 07/18/2013  . Seizures ([place])   . Sleep apnea    does not wear CPAP  . Thrombocytopenia ([place]) 06/29/2016  . Unspecified hypothyroidism 01/25/2013  . Unspecified pleural effusion     Past Surgical History:  Procedure Laterality Date  . CARDIAC CATHETERIZATION N/A 12/21/2015   Procedure: Right Heart Cath;  Surgeon: Jolaine Artist, MD;  Location: [facility];  Service: Cardiovascular;  Laterality: N/A;  . CARDIAC CATHETERIZATION N/A 04/15/2016   Procedure: Right/Left Heart Cath and Coronary Angiography;  Surgeon: Jolaine Artist, MD;  Location: [facility];  Service: Cardiovascular;  Laterality: N/A;  . COLONOSCOPY N/A 12/08/2013   Procedure: COLONOSCOPY;  Surgeon: Milus Banister, MD;  Location: [facility];   Service: Endoscopy;  Laterality: N/A;  . ESOPHAGOGASTRODUODENOSCOPY (EGD) WITH PROPOFOL N/A 03/27/2016   Procedure: ESOPHAGOGASTRODUODENOSCOPY (EGD) WITH PROPOFOL;  Surgeon: Milus Banister, MD;  Location: [facility];  Service: Endoscopy;  Laterality: N/A;  . RIGHT HEART CATHETERIZATION Right 06/06/2014   Procedure: RIGHT HEART CATH;  Surgeon: Jolaine Artist, MD;  Location: [facility];  Service: Cardiovascular;  Laterality: Right;  . UVULOPALATOPHARYNGOPLASTY  1999    Family History  Problem Relation Age of Onset  . Heart disease Mother   . Heart attack Mother   . Hypertension Mother   . Prostate cancer Father   . Colon cancer Neg Hx     Social History   Social History  . Marital status: Divorced    Spouse name: N/A  . Number of children: 3  . Years of education: N/A   Occupational History  . Retired     Social research officer, government   Social History Main Topics  . Smoking status: Former Smoker    Packs/day: 2.00    Years: 5.00    Types: Cigarettes    Quit date: 09/22/1979  . Smokeless tobacco: Never Used  . Alcohol use 3.6 oz/week    6 Shots of liquor per week     Comment: 06/04/2016 "2 vodka & tonics on MWF"  . Drug use: No  . Sexual activity: Not Currently   Other Topics Concern  . Not on file   Social History Narrative  0 caffeine drinks daily     Outpatient Medications Prior to Visit  Medication Sig Dispense Refill  . albuterol (PROVENTIL HFA;VENTOLIN HFA) 108 (90 BASE) MCG/ACT inhaler Inhale 2 puffs into the lungs every 6 (six) hours as needed for wheezing or shortness of breath.    . doxepin (SINEQUAN) 10 MG capsule TAKE 1 TO 2 CAPSULES AT BEDTIME 180 capsule 1  . Fluticasone Propionate 0.05 % LOTN Apply daily to affected area (Patient taking differently: Apply 1 application topically daily. Apply daily to affected area) 3 Bottle 3  . furosemide (LASIX) 40 MG tablet Take 1 tablet (40 mg total) by mouth daily. Take as directed on Monday and Friday 30 tablet 3  .  HYDROcodone-acetaminophen (NORCO) 5-325 MG tablet Take 1 tablet by mouth every 6 (six) hours as needed for moderate pain. 20 tablet 0  . levothyroxine (SYNTHROID, LEVOTHROID) 25 MCG tablet TAKE 1 TABLET DAILY 90 tablet 1  . loperamide (ANTI-DIARRHEAL) 2 MG capsule Take 4 mg by mouth as needed for diarrhea or loose stools (up to 8 capsules per day).     Marland Kitchen losartan (COZAAR) 50 MG tablet Take 50 mg by mouth daily.    . Macitentan (OPSUMIT) 10 MG TABS Take 10 mg by mouth daily. 90 tablet 3  . methocarbamol (ROBAXIN) 750 MG tablet Take 1 tablet (750 mg total) by mouth 3 (three) times daily. 30 tablet 0  . mometasone-formoterol (DULERA) 100-5 MCG/ACT AERO Inhale 2 puffs into the lungs daily.     . Multiple Vitamin (MULTIVITAMIN WITH MINERALS) TABS tablet Take 1 tablet by mouth daily.    Marland Kitchen omeprazole (PRILOSEC) 40 MG capsule TAKE 1 CAPSULE DAILY 90 capsule 2  . potassium chloride (K-DUR,KLOR-CON) 10 MEQ tablet Take 10 mEq by mouth daily.    . Selexipag (UPTRAVI) 1000 MCG TABS Take 1,000 mg by mouth every morning. 90 tablet 2  . Selexipag (UPTRAVI) 800 MCG TABS Take 1 tablet (800 mcg total) by mouth every evening. 90 tablet 2  . spironolactone (ALDACTONE) 50 MG tablet TAKE 1 TABLET DAILY 90 tablet 0  . SYMAX-SR 0.375 MG 12 hr tablet TAKE 1 TABLET TWICE A DAY 180 tablet 1  . Tadalafil, PAH, (ADCIRCA) 20 MG TABS TAKE 2 TABLETS (40 MG TOTAL) DAILY 180 tablet 2  . traMADol (ULTRAM) 50 MG tablet Take 1 tablet (50 mg total) by mouth every 12 (twelve) hours as needed for moderate pain or severe pain. 10 tablet 0   No facility-administered medications prior to visit.     Allergies  Allergen Reactions  . Aspirin Other (See Comments)    hypertension    Review of Systems  Constitutional: Positive for malaise/fatigue. Negative for fever.  HENT: Negative for congestion.   Eyes: Negative for blurred vision.  Respiratory: Positive for shortness of breath.   Cardiovascular: Negative for chest pain,  palpitations and leg swelling.  Gastrointestinal: Positive for diarrhea. Negative for abdominal pain, blood in stool and nausea.  Genitourinary: Negative for dysuria and frequency.  Musculoskeletal: Negative for falls.  Skin: Negative for rash.  Neurological: Negative for dizziness, loss of consciousness and headaches.  Endo/Heme/Allergies: Negative for environmental allergies.  Psychiatric/Behavioral: Negative for depression. The patient is not nervous/anxious.        Objective:    Physical Exam  Constitutional: He is oriented to person, place, and time. He appears well-developed and well-nourished. No distress.  HENT:  Head: Normocephalic and atraumatic.  Nose: Nose normal.  Eyes: Right eye exhibits no discharge. Left eye exhibits  no discharge.  Neck: Normal range of motion. Neck supple.  Cardiovascular: Normal rate and regular rhythm.   Pulmonary/Chest: Effort normal and breath sounds normal.  Abdominal: Soft. Bowel sounds are normal. There is no tenderness.  Musculoskeletal: He exhibits no edema.  Neurological: He is alert and oriented to person, place, and time.  Skin: Skin is warm and dry.  Psychiatric: He has a normal mood and affect.  Nursing note and vitals reviewed.   BP 124/70 (BP Location: Right Arm, Patient Position: Sitting, Cuff Size: Large)   Pulse 88   Temp 97.8 F (36.6 C) (Oral)   Ht 6' (1.829 m)   Wt 206 lb 8 oz (93.7 kg)   SpO2 95%   BMI 28.01 kg/m  Wt Readings from Last 3 Encounters:  09/11/16 206 lb 8 oz (93.7 kg)  09/10/16 207 lb 12.8 oz (94.3 kg)  07/25/16 203 lb 6.4 oz (92.3 kg)     Lab Results  Component Value Date   WBC 1.8 Repeated and verified X2. (LL) 09/11/2016   HGB 12.9 (L) 09/11/2016   HCT 38.5 (L) 09/11/2016   PLT 33.0 Repeated and verified X2. (LL) 09/11/2016   GLUCOSE 97 09/11/2016   CHOL 198 06/20/2016   TRIG 485.0 (H) 06/20/2016   HDL 62.40 06/20/2016   LDLDIRECT 68.0 06/20/2016   LDLCALC 77 12/04/2015   ALT 22  09/11/2016   AST 48 (H) 09/11/2016   NA 138 09/11/2016   K 4.1 09/11/2016   CL 107 09/11/2016   CREATININE 1.36 09/11/2016   BUN 24 (H) 09/11/2016   CO2 24 09/11/2016   TSH 3.47 06/20/2016   PSA 0.49 11/01/2012   INR 1.25 06/04/2016   HGBA1C 5.4 03/02/2015    Lab Results  Component Value Date   TSH 3.47 06/20/2016   Lab Results  Component Value Date   WBC 1.8 Repeated and verified X2. (LL) 09/11/2016   HGB 12.9 (L) 09/11/2016   HCT 38.5 (L) 09/11/2016   MCV 103.9 (H) 09/11/2016   PLT 33.0 Repeated and verified X2. (LL) 09/11/2016   Lab Results  Component Value Date   NA 138 09/11/2016   K 4.1 09/11/2016   CO2 24 09/11/2016   GLUCOSE 97 09/11/2016   BUN 24 (H) 09/11/2016   CREATININE 1.36 09/11/2016   BILITOT 0.9 09/11/2016   ALKPHOS 166 (H) 09/11/2016   AST 48 (H) 09/11/2016   ALT 22 09/11/2016   PROT 7.7 09/11/2016   ALBUMIN 4.1 09/11/2016   CALCIUM 9.3 09/11/2016   ANIONGAP 8 07/20/2016   GFR 67.30 09/11/2016   Lab Results  Component Value Date   CHOL 198 06/20/2016   Lab Results  Component Value Date   HDL 62.40 06/20/2016   Lab Results  Component Value Date   LDLCALC 77 12/04/2015   Lab Results  Component Value Date   TRIG 485.0 (H) 06/20/2016   Lab Results  Component Value Date   CHOLHDL 3 06/20/2016   Lab Results  Component Value Date   HGBA1C 5.4 03/02/2015       Assessment & Plan:   Problem List Items Addressed This Visit    Hypertension (Chronic)    Well controlled, no changes to meds. Encouraged heart healthy diet such as the DASH diet and exercise as tolerated.       Relevant Orders   Comp Met (CMET) (Completed)   CBC w/Diff (Completed)   Magnesium (Completed)   Insomnia    Encouraged good sleep hygiene such as dark, quiet  room. No blue/green glowing lights such as computer screens in bedroom. No alcohol or stimulants in evening. Cut down on caffeine as able. Regular exercise is helpful but not just prior to bed time.         Diarrhea - Primary    Persistent but manageable at the present time. Encouraged to try adding Benefiber twice daily to beverages.       Relevant Orders   Comp Met (CMET) (Completed)   CBC w/Diff (Completed)   Magnesium (Completed)   Thrombocytopenia (HCC)    Persistent but asymptomatic. Secondary to liver disease from alcohol. Encouraged complete alcohol cessation       Other Visit Diagnoses    Encounter for immunization       Relevant Orders   Flu vaccine HIGH DOSE PF (Completed)      I am having Mr. Isenhower maintain his loperamide, Macitentan, Fluticasone Propionate, albuterol, Selexipag, Selexipag, mometasone-formoterol, tadalafil (PAH), doxepin, furosemide, levothyroxine, losartan, methocarbamol, multivitamin with minerals, traMADol, HYDROcodone-acetaminophen, omeprazole, SYMAX-SR, spironolactone, and potassium chloride.  No orders of the defined types were placed in this encounter.    Penni Homans, MD

## 2016-09-14 NOTE — Assessment & Plan Note (Signed)
Encouraged good sleep hygiene such as dark, quiet room. No blue/green glowing lights such as computer screens in bedroom. No alcohol or stimulants in evening. Cut down on caffeine as able. Regular exercise is helpful but not just prior to bed time.

## 2016-09-14 NOTE — Assessment & Plan Note (Signed)
Persistent but asymptomatic. Secondary to liver disease from alcohol. Encouraged complete alcohol cessation

## 2016-09-14 NOTE — Assessment & Plan Note (Signed)
Persistent but manageable at the present time. Encouraged to try adding Benefiber twice daily to beverages.

## 2016-09-30 ENCOUNTER — Other Ambulatory Visit (INDEPENDENT_AMBULATORY_CARE_PROVIDER_SITE_OTHER): Payer: Medicare Other

## 2016-09-30 DIAGNOSIS — D72829 Elevated white blood cell count, unspecified: Secondary | ICD-10-CM | POA: Diagnosis not present

## 2016-10-01 ENCOUNTER — Telehealth: Payer: Self-pay | Admitting: *Deleted

## 2016-10-01 LAB — CBC WITH DIFFERENTIAL/PLATELET
BASOS ABS: 0 10*3/uL (ref 0.0–0.1)
BASOS PCT: 0.4 % (ref 0.0–3.0)
EOS ABS: 0 10*3/uL (ref 0.0–0.7)
Eosinophils Relative: 0.9 % (ref 0.0–5.0)
HEMATOCRIT: 38.3 % — AB (ref 39.0–52.0)
HEMOGLOBIN: 13.1 g/dL (ref 13.0–17.0)
LYMPHS PCT: 27.1 % (ref 12.0–46.0)
Lymphs Abs: 0.6 10*3/uL — ABNORMAL LOW (ref 0.7–4.0)
MCHC: 34.3 g/dL (ref 30.0–36.0)
MCV: 102.9 fl — AB (ref 78.0–100.0)
MONOS PCT: 9.9 % (ref 3.0–12.0)
Monocytes Absolute: 0.2 10*3/uL (ref 0.1–1.0)
Neutro Abs: 1.5 10*3/uL (ref 1.4–7.7)
Neutrophils Relative %: 61.7 % (ref 43.0–77.0)
Platelets: 36 10*3/uL — CL (ref 150.0–400.0)
RBC: 3.73 Mil/uL — ABNORMAL LOW (ref 4.22–5.81)
RDW: 14.6 % (ref 11.5–15.5)
WBC: 2.4 10*3/uL — AB (ref 4.0–10.5)

## 2016-10-01 NOTE — Telephone Encounter (Signed)
[facility] reporting critical 36,000 platelet count

## 2016-10-01 NOTE — Telephone Encounter (Signed)
Called and spoke with the pt and informed him of the note below.  Pt verbalized understanding.  Asked the pt when is his next follow up appointment with heme.  Pt stated that he did not know.  Asked the pt to give heme a call to see when is his next follow up appointment.  Pt verbalized understanding and agreed.//AB/CMA

## 2016-10-01 NOTE — Telephone Encounter (Signed)
Noted. He sees heme. No significant change from baseline. Advise to stop drinking if he hasn't already. ANC improved.

## 2016-10-12 ENCOUNTER — Other Ambulatory Visit: Payer: Self-pay | Admitting: Family Medicine

## 2016-10-21 ENCOUNTER — Ambulatory Visit (HOSPITAL_BASED_OUTPATIENT_CLINIC_OR_DEPARTMENT_OTHER): Payer: Medicare Other | Admitting: Oncology

## 2016-10-21 ENCOUNTER — Telehealth: Payer: Self-pay | Admitting: Oncology

## 2016-10-21 ENCOUNTER — Other Ambulatory Visit (HOSPITAL_BASED_OUTPATIENT_CLINIC_OR_DEPARTMENT_OTHER): Payer: Medicare Other

## 2016-10-21 ENCOUNTER — Other Ambulatory Visit: Payer: Self-pay | Admitting: Oncology

## 2016-10-21 VITALS — BP 154/78 | HR 92 | Temp 97.7°F | Resp 18 | Wt 207.0 lb

## 2016-10-21 DIAGNOSIS — D696 Thrombocytopenia, unspecified: Secondary | ICD-10-CM | POA: Diagnosis not present

## 2016-10-21 DIAGNOSIS — K7460 Unspecified cirrhosis of liver: Secondary | ICD-10-CM

## 2016-10-21 DIAGNOSIS — D61818 Other pancytopenia: Secondary | ICD-10-CM

## 2016-10-21 DIAGNOSIS — K7030 Alcoholic cirrhosis of liver without ascites: Secondary | ICD-10-CM

## 2016-10-21 DIAGNOSIS — D7389 Other diseases of spleen: Secondary | ICD-10-CM | POA: Diagnosis not present

## 2016-10-21 DIAGNOSIS — D72819 Decreased white blood cell count, unspecified: Secondary | ICD-10-CM | POA: Diagnosis not present

## 2016-10-21 LAB — CBC WITH DIFFERENTIAL/PLATELET
BASO%: 0 % (ref 0.0–2.0)
BASOS ABS: 0 10*3/uL (ref 0.0–0.1)
EOS%: 0.8 % (ref 0.0–7.0)
Eosinophils Absolute: 0 10*3/uL (ref 0.0–0.5)
HEMATOCRIT: 38.8 % (ref 38.4–49.9)
HEMOGLOBIN: 13.1 g/dL (ref 13.0–17.1)
LYMPH#: 0.8 10*3/uL — AB (ref 0.9–3.3)
LYMPH%: 32 % (ref 14.0–49.0)
MCH: 35 pg — AB (ref 27.2–33.4)
MCHC: 33.8 g/dL (ref 32.0–36.0)
MCV: 103.7 fL — AB (ref 79.3–98.0)
MONO#: 0.3 10*3/uL (ref 0.1–0.9)
MONO%: 10.8 % (ref 0.0–14.0)
NEUT#: 1.4 10*3/uL — ABNORMAL LOW (ref 1.5–6.5)
NEUT%: 56.4 % (ref 39.0–75.0)
Platelets: 36 10*3/uL — ABNORMAL LOW (ref 140–400)
RBC: 3.74 10*6/uL — ABNORMAL LOW (ref 4.20–5.82)
RDW: 14.9 % — ABNORMAL HIGH (ref 11.0–14.6)
WBC: 2.4 10*3/uL — ABNORMAL LOW (ref 4.0–10.3)
nRBC: 0 % (ref 0–0)

## 2016-10-21 NOTE — Telephone Encounter (Signed)
Appointments scheduled per 12/19 LOS. Patient given AVS report and calendars with future scheduled appointments.

## 2016-10-21 NOTE — Progress Notes (Signed)
Hematology and Oncology Follow Up Visit  Darren Mueller [id number] 07-05-1951 65 y.o. 10/21/2016 9:13 AM Darren Mueller, MDBlyth, Darren Levan, MD   Principle Diagnosis: 65 year old gentleman with thrombocytopenia and a leukocytopenia related due to cirrhosis of the liver and splenic sequestration. These findings have been documented dating back to at least 2012.  Current therapy: Observation and surveillance.  Interim History:  [name] today for a follow-up visit. Since his last visit, he reports no major changes in his health. He feels relatively fair and denies any bleeding. He denied any hematochezia, melena,  hemoptysis or epistaxis. His appetite remained reasonable and denied any recent infections. He denied any sinusitis, cellulitis or urinary tract infections. He remains reasonably active and attends to activities of daily living. His weight has not dramatically changed. He denied any recent hospitalization or illnesses.  He does not report any headaches, blurry vision, syncope or seizures. He does not report any fevers, chills or sweats. He does not report any cough, wheezing or hemoptysis. He does not report any chest pain, palpitation or orthopnea. Does not report any nausea, vomiting, abdominal pain, or early satiety. He does not report any constipation or diarrhea. He does not report any frequency urgency or hesitancy. He does not report any skeletal complaints. Remaining review of systems unremarkable.   Medications: I have reviewed the patient's current medications.  Current Outpatient Prescriptions  Medication Sig Dispense Refill  . albuterol (PROVENTIL HFA;VENTOLIN HFA) 108 (90 BASE) MCG/ACT inhaler Inhale 2 puffs into the lungs every 6 (six) hours as needed for wheezing or shortness of breath.    . doxepin (SINEQUAN) 10 MG capsule TAKE 1 TO 2 CAPSULES AT BEDTIME 180 capsule 1  . Fluticasone Propionate 0.05 % LOTN Apply daily to affected area (Patient taking differently:  Apply 1 application topically daily. Apply daily to affected area) 3 Bottle 3  . furosemide (LASIX) 40 MG tablet Take 1 tablet (40 mg total) by mouth daily. Take as directed on Monday and Friday 30 tablet 3  . HYDROcodone-acetaminophen (NORCO) 5-325 MG tablet Take 1 tablet by mouth every 6 (six) hours as needed for moderate pain. 20 tablet 0  . levothyroxine (SYNTHROID, LEVOTHROID) 25 MCG tablet TAKE 1 TABLET DAILY 90 tablet 1  . loperamide (ANTI-DIARRHEAL) 2 MG capsule Take 4 mg by mouth as needed for diarrhea or loose stools (up to 8 capsules per day).     Marland Kitchen losartan (COZAAR) 50 MG tablet Take 50 mg by mouth daily.    . Macitentan (OPSUMIT) 10 MG TABS Take 10 mg by mouth daily. 90 tablet 3  . methocarbamol (ROBAXIN) 750 MG tablet Take 1 tablet (750 mg total) by mouth 3 (three) times daily. 30 tablet 0  . mometasone-formoterol (DULERA) 100-5 MCG/ACT AERO Inhale 2 puffs into the lungs daily.     . Multiple Vitamin (MULTIVITAMIN WITH MINERALS) TABS tablet Take 1 tablet by mouth daily.    Marland Kitchen omeprazole (PRILOSEC) 40 MG capsule TAKE 1 CAPSULE DAILY 90 capsule 2  . potassium chloride (K-DUR,KLOR-CON) 10 MEQ tablet Take 10 mEq by mouth daily.    . Selexipag (UPTRAVI) 1000 MCG TABS Take 1,000 mg by mouth every morning. 90 tablet 2  . Selexipag (UPTRAVI) 800 MCG TABS Take 1 tablet (800 mcg total) by mouth every evening. 90 tablet 2  . spironolactone (ALDACTONE) 50 MG tablet TAKE 1 TABLET DAILY 90 tablet 0  . SYMAX-SR 0.375 MG 12 hr tablet TAKE 1 TABLET TWICE A DAY 180 tablet 1  . Tadalafil,  PAH, (ADCIRCA) 20 MG TABS TAKE 2 TABLETS (40 MG TOTAL) DAILY 180 tablet 2  . traMADol (ULTRAM) 50 MG tablet Take 1 tablet (50 mg total) by mouth every 12 (twelve) hours as needed for moderate pain or severe pain. 10 tablet 0   No current facility-administered medications for this visit.      Allergies:  Allergies  Allergen Reactions  . Aspirin Other (See Comments)    hypertension    Past Medical History,  Surgical history, Social history, and Family History were reviewed and updated.   Physical Exam: Blood pressure (!) 154/78, pulse 92, temperature 97.7 F (36.5 C), temperature source Oral, resp. rate 18, weight 207 lb (93.9 kg), SpO2 98 %. ECOG: 1 General appearance: Well-appearing gentleman without distress. Head: Normocephalic, without obvious abnormality. No oral ulcers or lesions. No bleeding noted. Neck: no adenopathy Lymph nodes: Cervical, supraclavicular, and axillary nodes normal. Heart:regular rate and rhythm, S1, S2 normal, no murmur, click, rub or gallop Lung:chest clear, no wheezing, rales, normal symmetric air entry.  Abdomin: soft, non-tender, without masses or organomegaly no rebound or guarding. EXT:no erythema, induration, or nodules. No edema noted.   Lab Results: Lab Results  Component Value Date   WBC 2.4 (L) 09/30/2016   HGB 13.1 09/30/2016   HCT 38.3 (L) 09/30/2016   MCV 102.9 (H) 09/30/2016   PLT 36.0 Repeated and verified X2. (LL) 09/30/2016     Chemistry      Component Value Date/Time   NA 138 09/11/2016 1448   NA 140 06/15/2015 1414   K 4.1 09/11/2016 1448   K 3.6 06/15/2015 1414   CL 107 09/11/2016 1448   CL 106 06/15/2015 1414   CO2 24 09/11/2016 1448   CO2 17 (L) 06/15/2015 1414   BUN 24 (H) 09/11/2016 1448   BUN 18 06/15/2015 1414   CREATININE 1.36 09/11/2016 1448   CREATININE 1.5 (H) 06/15/2015 1414      Component Value Date/Time   CALCIUM 9.3 09/11/2016 1448   CALCIUM 9.5 06/15/2015 1414   ALKPHOS 166 (H) 09/11/2016 1448   ALKPHOS 145 (H) 06/15/2015 1414   AST 48 (H) 09/11/2016 1448   AST 68 (H) 06/15/2015 1414   ALT 22 09/11/2016 1448   ALT 35 06/15/2015 1414   BILITOT 0.9 09/11/2016 1448   BILITOT 1.40 06/15/2015 1414       Impression and Plan:  65 year old gentleman with the following issues:  1. Thrombocytopenia and leukocytopenia: These findings have been documented since 2012. These findings are attributed to  cirrhosis of the liver and splenic sequestration of white cells and platelets.  His counts have reviewed in the last 3 years and have not showed any dramatic changes. He continues to have fluctuation of platelet counts without any clinical signs or symptoms of bleeding.  He does not require any intervention or growth factor support. If he develops symptomatic bleeding and transfusion or platelet stimulating factors could be used. I also counseled him about the appropriate preoperative evaluations if surgery is needed in the future.  2. Cirrhosis of the liver: Related to his alcohol consumption which I have counseled him about drinking cessation at this time.  3. Follow-up: Will be in 6 months sooner if needed to.    [facility], MD 12/19/20179:13 AM

## 2016-11-05 ENCOUNTER — Other Ambulatory Visit (HOSPITAL_COMMUNITY): Payer: Self-pay | Admitting: *Deleted

## 2016-11-05 DIAGNOSIS — I10 Essential (primary) hypertension: Secondary | ICD-10-CM

## 2016-11-06 ENCOUNTER — Other Ambulatory Visit (HOSPITAL_COMMUNITY): Payer: Self-pay | Admitting: *Deleted

## 2016-11-29 ENCOUNTER — Other Ambulatory Visit: Payer: Self-pay | Admitting: Family Medicine

## 2016-12-02 ENCOUNTER — Ambulatory Visit (INDEPENDENT_AMBULATORY_CARE_PROVIDER_SITE_OTHER)
Admission: RE | Admit: 2016-12-02 | Discharge: 2016-12-02 | Disposition: A | Discharge status: Home / Self Care | Payer: Medicare Other | Source: Ambulatory Visit | Attending: Family Medicine | Admitting: Family Medicine

## 2016-12-02 ENCOUNTER — Ambulatory Visit (INDEPENDENT_AMBULATORY_CARE_PROVIDER_SITE_OTHER): Payer: Medicare Other | Admitting: Family Medicine

## 2016-12-02 ENCOUNTER — Encounter: Payer: Self-pay | Admitting: Family Medicine

## 2016-12-02 VITALS — BP 150/98 | HR 110 | Temp 98.0°F | Ht 72.0 in | Wt 205.3 lb

## 2016-12-02 DIAGNOSIS — R0600 Dyspnea, unspecified: Secondary | ICD-10-CM

## 2016-12-02 DIAGNOSIS — R059 Cough, unspecified: Secondary | ICD-10-CM

## 2016-12-02 DIAGNOSIS — R079 Chest pain, unspecified: Secondary | ICD-10-CM | POA: Diagnosis not present

## 2016-12-02 DIAGNOSIS — R05 Cough: Secondary | ICD-10-CM | POA: Diagnosis not present

## 2016-12-02 DIAGNOSIS — I10 Essential (primary) hypertension: Secondary | ICD-10-CM | POA: Diagnosis not present

## 2016-12-02 MED ORDER — LEVOFLOXACIN 500 MG PO TABS: 500.0000 mg | ORAL_TABLET | Freq: Every day | ORAL | 0 refills | Status: DC

## 2016-12-02 NOTE — Patient Instructions (Signed)
HOLD Doxepin while on the Levaquin Go and get CXR as discussed Follow up for any increasing fever or increased shortness of breath.

## 2016-12-02 NOTE — Progress Notes (Signed)
Subjective:     Patient ID: Darren Mueller, male   DOB: [birth year], 66 y.o.   MRN: [id number]  HPI Patient seen as a work in with 4 day history of cough and dyspnea. He's had some subjective fever and chills. Denies any nausea, vomiting, or diarrhea. He has multiple chronic problems including COPD, right sided heart failure secondary to pulmonary hypertension, hypertension, obstructive sleep apnea, cirrhosis, chronic kidney disease, hypothyroidism. He quit smoking several years ago. He takes Advair regularly. He has chronic thrombocytopenia and leukopenia probably related to cirrhosis.  He is afebrile today but has had some subjective fever past few days. He has some mild dyspnea with lying supine. No peripheral edema issues.  Past Medical History:  Diagnosis Date  . Alcohol dependence ([place]) 03/22/2011   In remission   . Alcohol dependence in remission ([place]) 03/22/2011   In remission   . Alcoholic cirrhosis of liver with ascites ([place]) 2014  . Dermatitis 06/10/2015  . Diarrhea 09/04/2013  . GERD (gastroesophageal reflux disease)   . Gout   . Heart murmur   . Hx of colonic polyps   . Hypertension   . Hypopotassemia   . Otalgia 11/28/2013  . Other chronic pulmonary heart diseases   . Preventative health care 06/29/2016  . Red eye 03/03/2016   right  . Renal insufficiency 07/18/2013  . Seizures ([place])   . Sleep apnea    does not wear CPAP  . Thrombocytopenia ([place]) 06/29/2016  . Unspecified hypothyroidism 01/25/2013  . Unspecified pleural effusion    Past Surgical History:  Procedure Laterality Date  . CARDIAC CATHETERIZATION N/A 12/21/2015   Procedure: Right Heart Cath;  Surgeon: Jolaine Artist, MD;  Location: [place] [facility];  Service: Cardiovascular;  Laterality: N/A;  . CARDIAC CATHETERIZATION N/A 04/15/2016   Procedure: Right/Left Heart Cath and Coronary Angiography;  Surgeon: Jolaine Artist, MD;  Location: [place] [facility];  Service: Cardiovascular;  Laterality: N/A;  .  COLONOSCOPY N/A 12/08/2013   Procedure: COLONOSCOPY;  Surgeon: Milus Banister, MD;  Location: [facility];  Service: Endoscopy;  Laterality: N/A;  . ESOPHAGOGASTRODUODENOSCOPY (EGD) WITH PROPOFOL N/A 03/27/2016   Procedure: ESOPHAGOGASTRODUODENOSCOPY (EGD) WITH PROPOFOL;  Surgeon: Milus Banister, MD;  Location: [place];  Service: Endoscopy;  Laterality: N/A;  . RIGHT HEART CATHETERIZATION Right 06/06/2014   Procedure: RIGHT HEART CATH;  Surgeon: Jolaine Artist, MD;  Location: [facility];  Service: Cardiovascular;  Laterality: Right;  . UVULOPALATOPHARYNGOPLASTY  1999    reports that he quit smoking about 37 years ago. His smoking use included Cigarettes. He has a 10.00 pack-year smoking history. He has never used smokeless tobacco. He reports that he drinks about 3.6 oz of alcohol per week . He reports that he does not use drugs. family history includes Heart attack in his mother; Heart disease in his mother; Hypertension in his mother; Prostate cancer in his father. Allergies  Allergen Reactions  . Aspirin Other (See Comments)    hypertension     Review of Systems  Constitutional: Positive for chills and fatigue.  Respiratory: Positive for cough and shortness of breath. Negative for wheezing.   Gastrointestinal: Negative for abdominal pain, diarrhea, nausea and vomiting.  Genitourinary: Negative for dysuria.  Neurological: Negative for dizziness.       Objective:   Physical Exam  Constitutional: He appears well-developed and well-nourished.  HENT:  Right Ear: External ear normal.  Left Ear: External ear normal.  Mouth/Throat: Oropharynx is clear and moist.  Neck: Neck supple.  Cardiovascular: Normal rate and regular rhythm.   Pulmonary/Chest: Effort normal. No respiratory distress. He has no wheezes. He has no rales.  Musculoskeletal: He exhibits no edema.       Assessment:     Acute respiratory illness. Patient is very high risk because of his COPD and cor  pulmonale with pulmonary hypertension history.  Currently afebrile and pulse oximetry is 97% (and in no respiratory distress). He is nontoxic in appearance.  May be viral but need to rule out pneumonia.  Elected to not check influenza screen as symptoms started over 4 days ago.    Plan:     -Obtain chest x-ray -Start Levaquin 500 milligrams once daily for 7 days -follow up immediately for any increased fever or increasing shortness of breath  Eulas Post MD [facility]

## 2016-12-02 NOTE — Progress Notes (Signed)
Pre visit review using our clinic review tool, if applicable. No additional management support is needed unless otherwise documented below in the visit note.

## 2016-12-03 ENCOUNTER — Other Ambulatory Visit: Payer: Self-pay | Admitting: Family Medicine

## 2016-12-03 ENCOUNTER — Telehealth: Payer: Self-pay | Admitting: Family Medicine

## 2016-12-03 ENCOUNTER — Other Ambulatory Visit: Payer: Self-pay

## 2016-12-03 MED ORDER — HYDROCODONE-HOMATROPINE 5-1.5 MG/5ML PO SYRP: 5.0000 mL | ORAL_SOLUTION | Freq: Four times a day (QID) | ORAL | 0 refills | Status: DC | PRN

## 2016-12-03 NOTE — Telephone Encounter (Signed)
I have signed a prescription for Hycodan which he has taken before.

## 2016-12-03 NOTE — Telephone Encounter (Signed)
Relation to JU:DILO Call back number:[phone]   Reason for call:  Patient was seen by Dr. Eulas Post, MD 12/02/16 due to [facility] location being 100% booked. Patient was seen for flu like symtopms, levofloxacin (LEVAQUIN) 500 MG tablet was prescribed but nothing for the cough. Patient states Dr. Elease Hashimoto office advised to call PCP for cough Rx due to patient being on a lot of medicationS, please advise patient seeking cough Rx.

## 2016-12-04 NOTE — Telephone Encounter (Signed)
Called left detailed message to pickup hardcopy for cough. Did message him to call me to confirm message he got.

## 2016-12-04 NOTE — Telephone Encounter (Signed)
Patient informed

## 2016-12-05 MED FILL — HYDROCODONE-HOMATROPINE SYR: 5-1.5 | 12 days supply | Qty: 240 | Fill #0

## 2016-12-09 ENCOUNTER — Encounter (HOSPITAL_COMMUNITY): Payer: Self-pay | Admitting: Internal Medicine

## 2016-12-09 ENCOUNTER — Ambulatory Visit (HOSPITAL_COMMUNITY)
Admission: RE | Admit: 2016-12-09 | Discharge: 2016-12-09 | Disposition: A | Discharge status: Home / Self Care | Payer: Medicare Other | Source: Ambulatory Visit | Attending: Internal Medicine | Admitting: Internal Medicine

## 2016-12-09 VITALS — BP 142/92 | HR 79 | Wt 202.2 lb

## 2016-12-09 DIAGNOSIS — I50810 Right heart failure, unspecified: Secondary | ICD-10-CM

## 2016-12-09 DIAGNOSIS — K7460 Unspecified cirrhosis of liver: Secondary | ICD-10-CM | POA: Insufficient documentation

## 2016-12-09 DIAGNOSIS — I2729 Other secondary pulmonary hypertension: Secondary | ICD-10-CM | POA: Diagnosis not present

## 2016-12-09 DIAGNOSIS — Z7951 Long term (current) use of inhaled steroids: Secondary | ICD-10-CM | POA: Insufficient documentation

## 2016-12-09 DIAGNOSIS — I10 Essential (primary) hypertension: Secondary | ICD-10-CM | POA: Diagnosis not present

## 2016-12-09 DIAGNOSIS — K766 Portal hypertension: Secondary | ICD-10-CM | POA: Diagnosis not present

## 2016-12-09 DIAGNOSIS — I272 Pulmonary hypertension, unspecified: Secondary | ICD-10-CM | POA: Insufficient documentation

## 2016-12-09 DIAGNOSIS — I2510 Atherosclerotic heart disease of native coronary artery without angina pectoris: Secondary | ICD-10-CM | POA: Diagnosis not present

## 2016-12-09 DIAGNOSIS — N183 Chronic kidney disease, stage 3 (moderate): Secondary | ICD-10-CM | POA: Insufficient documentation

## 2016-12-09 DIAGNOSIS — I2721 Secondary pulmonary arterial hypertension: Secondary | ICD-10-CM | POA: Diagnosis not present

## 2016-12-09 DIAGNOSIS — I129 Hypertensive chronic kidney disease with stage 1 through stage 4 chronic kidney disease, or unspecified chronic kidney disease: Secondary | ICD-10-CM | POA: Diagnosis not present

## 2016-12-09 DIAGNOSIS — K7030 Alcoholic cirrhosis of liver without ascites: Secondary | ICD-10-CM

## 2016-12-09 DIAGNOSIS — G4733 Obstructive sleep apnea (adult) (pediatric): Secondary | ICD-10-CM | POA: Insufficient documentation

## 2016-12-09 DIAGNOSIS — Z79899 Other long term (current) drug therapy: Secondary | ICD-10-CM | POA: Insufficient documentation

## 2016-12-09 DIAGNOSIS — D61818 Other pancytopenia: Secondary | ICD-10-CM | POA: Insufficient documentation

## 2016-12-09 MED ORDER — TADALAFIL (PAH) 20 MG PO TABS: ORAL_TABLET | ORAL | 2 refills | Status: DC

## 2016-12-09 NOTE — Addendum Note (Signed)
Encounter addended by: Kennieth Rad, RN on: 12/09/2016 10:51 AM<BR>    Actions taken: Sign clinical note

## 2016-12-09 NOTE — Patient Instructions (Signed)
Follow up in 4 months with an Echo

## 2016-12-09 NOTE — Progress Notes (Signed)
ADVANCED HF CLINIC NOTE  Patient ID: Darren Mueller, male   DOB: 30-Jun-1951, 66 y.o.   MRN: [id number] GI : Dr Christella Hartigan PCP: Dr Abner Greenspan  Subjective:    [name] ("[name]") is a 66 yo with history of COPD, portopulmonary hypertension with RV failure, and ETOH cirrhosis presents for followup of his PAH.  Admitted in 6/17 with CP and SOB. Was diuresed. Troponins normal. Underwent R/L cath. Minimal CAD with moderate PAH and preserved cardiac output.   Admitted 06/2016 and 07/2016 with syncope.  On September admission found to have elevated ETOH level as well as R 6th rib fracture.   Wore 30 day monitory up until 08/28/16. No AF noted. No events.  9% tachycardia.   He presents today for regular follow up. Weight down about 5 lbs from visit in August. Recently suffering from URI but now getting over it. Overall stable. Has had severe cough. Selexipag down from 1000 BID to 1000/800. No worsening HAs or flushing.  Remains on Macitentan and Adcirca as well. No edema. Occasional presyncope with coughing. Drinking 2 days per week.   Saw hematologist. Felt like leukopenia and thrombocytopenia we from splenic sequestration. Counts stable.   06/2016 1320 feet   PAH meds 1) Macitnentan 2) Adcirca 3) Selexapeg  Studies:  ECHO 12/14 EF 55-60% Peak PA pressure 35. Severe RV dysfunction  ECHO 7/15 EF 60% RV moderately to severely dilated. Moderate HK RVSP 57mm HG  ECHO 6/16 EF 60% RV moderately to severely dilated. Severe HK RVSP ~65 mm HG. D-shaped septum Echo 2/17 LVEF 60-65% RV massively dilated. Flat septum. Severe HK. Moderate TR RVSP ~65. IVC small. No effusion   Cath 6/17  Mid RCA lesion, 20% stenosed. Dist LAD lesion, 20% stenosed.  Ao = 107/70 (87) LV = 106/3/11 RA = 4 RV = 74/11 PA = 74/26 (45) PCW = 6 Fick cardiac output/index = 4.6.2.2 PVR = 8.5 Ao sat = 95% PA sat = 61%, 58%  RHC 2/17 RA = 11 RV = 80/16/19 PA = 83/61 (71) PCW = 9 Fick cardiac output/index =  5.6/2.6 PVR = 10.3 WU Ao sat = 94% PA sat = 65%, 68% High SVC sat = 65%  RHC 2/14: RA = 13  RV = 63/5/14  PA = 65/24 (41)  PCW = 6  Hepatic wedge = 21  Fick cardiac output/index = 3.8/1.8  PVR = 9.1  FA sat = 94%  PA sat = 58%, 56%  SVC = 54%  RHC 06/06/14 RA = 5  RV = 73/5/9  PA = 81/25 (46)  PCW = 7  Fick cardiac output/index = 6.0/2.8  PVR = 6.5 [place]  FA sat = 95%  PA sat = 71%, 67%  Unable to get hepatic wedge  PFTs 3/14 FEV1 2.02 L (58%) FVC 2.7L (54%) FEV1/FVC (89%) DLCO 53%  PFTs 1/17 FEV1 2.05 L (61%) FVC 2.48L (56%) DLCO 44%   Event Monitor - NSR 10/26/13.   Ab u/s 8/16: + cirrhosis/mild to moderate splenomegaly  6 min walk 01/10/13, 1290 feet  (01/10/14) = 1280 feet (380 m) (9/15) = 1350 feet (411 m) O2 sats ranged from 89-95% on room air, HR ranged from 100-129. (6/16) = 384 meters  (2/17) = 1250 feet (326m) (8/17) = 1320 feet   Labs:  10/19/13 Creatinine 1.79 K 4.1  3/15        Cr 1.78 K 4.2 03/17/14    CR 1.23 K 4.0 LFTs ok  06/06/14 K 4.0 Creatinine 1.4  08/08/14 K 4.0 Cr 1.6 09/12/14 K 4.3 Cr 1.8 LFTs ok, HCT 58 12/15 K 4.3 Cr 1.15  06/2015 K 3.6, Cr 1.5 1/17  K 3.7 Cr 1.29 AST 75 ALT 33 Albumin 4.0 Bili 1.4 Wbc 2.2 hgb 13.5 PLT 61k 04/28/16  K 4.3 Cr 1.39 Hgb 12.9 PLT 44 BNP 383   Current Outpatient Prescriptions on File Prior to Encounter  Medication Sig Dispense Refill  . albuterol (PROVENTIL HFA;VENTOLIN HFA) 108 (90 BASE) MCG/ACT inhaler Inhale 2 puffs into the lungs every 6 (six) hours as needed for wheezing or shortness of breath.    . doxepin (SINEQUAN) 10 MG capsule TAKE 1 TO 2 CAPSULES AT BEDTIME 180 capsule 1  . Fluticasone Propionate 0.05 % LOTN Apply daily to affected area (Patient taking differently: Apply 1 application topically daily. Apply daily to affected area) 3 Bottle 3  . furosemide (LASIX) 40 MG tablet Take 1 tablet (40 mg total) by mouth daily. Take as directed on Monday and Friday 30 tablet 3  .  HYDROcodone-acetaminophen (NORCO) 5-325 MG tablet Take 1 tablet by mouth every 6 (six) hours as needed for moderate pain. 20 tablet 0  . levothyroxine (SYNTHROID, LEVOTHROID) 25 MCG tablet TAKE 1 TABLET DAILY 90 tablet 1  . losartan (COZAAR) 50 MG tablet Take 50 mg by mouth daily.    . Macitentan (OPSUMIT) 10 MG TABS Take 10 mg by mouth daily. 90 tablet 3  . methocarbamol (ROBAXIN) 750 MG tablet Take 1 tablet (750 mg total) by mouth 3 (three) times daily. 30 tablet 0  . mometasone-formoterol (DULERA) 100-5 MCG/ACT AERO Inhale 2 puffs into the lungs daily.     . Multiple Vitamin (MULTIVITAMIN WITH MINERALS) TABS tablet Take 1 tablet by mouth daily.    Marland Kitchen omeprazole (PRILOSEC) 40 MG capsule TAKE 1 CAPSULE DAILY 90 capsule 2  . potassium chloride (K-DUR,KLOR-CON) 10 MEQ tablet Take 10 mEq by mouth daily.    . Selexipag (UPTRAVI) 1000 MCG TABS Take 1,000 mg by mouth every morning. 90 tablet 2  . Selexipag (UPTRAVI) 800 MCG TABS Take 1 tablet (800 mcg total) by mouth every evening. 90 tablet 2  . spironolactone (ALDACTONE) 50 MG tablet TAKE 1 TABLET DAILY 90 tablet 0  . SYMAX-SR 0.375 MG 12 hr tablet TAKE 1 TABLET TWICE A DAY 180 tablet 1  . traMADol (ULTRAM) 50 MG tablet Take 1 tablet (50 mg total) by mouth every 12 (twelve) hours as needed for moderate pain or severe pain. 10 tablet 0  . loperamide (ANTI-DIARRHEAL) 2 MG capsule Take 4 mg by mouth as needed for diarrhea or loose stools (up to 8 capsules per day).     . Tadalafil, PAH, (ADCIRCA) 20 MG TABS TAKE 2 TABLETS (40 MG TOTAL) DAILY (Patient not taking: Reported on 12/09/2016) 180 tablet 2   No current facility-administered medications on file prior to encounter.     Objective:   Weight Range:  Vital Signs:   Pulse Rate:  [79] 79 (02/06 1000) BP: (142)/(92) 142/92 (02/06 1000) SpO2:  [100 %] 100 % (02/06 1000) Weight:  [202 lb 4 oz (91.7 kg)] 202 lb 4 oz (91.7 kg) (02/06 1000)    Wt Readings from Last 3 Encounters:  12/09/16 202 lb 4  oz (91.7 kg)  12/02/16 205 lb 4.8 oz (93.1 kg)  10/21/16 207 lb (93.9 kg)    Physical Exam: General: NAD, facial flushing. + nasal congestion Neck: JVP ~8-9  cm  no thyromegaly  or thyroid nodule.  Lungs: CTAB, normal effort. CV: Nondisplaced PMI. Heart regular  2/6 TR  Loud P2  + RV lift  No carotid bruit.   Abdomen: Soft, NT, ND, no HSM. No bruits or masses. +BS  Ext: warm, No clubbing, trace edema.Normal pedal pulses. Skin: Intact without lesions or rashes.  Neurologic: Alert and oriented x 3.  Psych: Normal affect.  Extremities: No clubbing or cyanosis.  HEENT: Normal. Except for erythematous rash/flushing   Assessment/Plan     1. PAH: Suspected portopulmonary HTN. - Remains on triple therapy. Recent RHC with slightly improved hemodynamics  - Persistent NYHA III symptoms. stable - Continue macitentan 10 daily, adcirca 40 mg daily, selexipag 1000/800.  - Will continue current therapy. Not candidate for IV therapies at this point but will closely for decompensation.  - Again stressed need to stop ETOH completely.   - Volume status relatively stable. Can take extra lasix as needed.  - Repeat echo in 4 months 2. CKD III:  - Stable on recent labs. Sees. PCP tomorrow.  3. Cirrhosis:  - Likely combination of RV failure an ETOH - As above, again recommended complete cessation of ETOH.  - Follows with Dr. Christella Hartigan.  4. Syncope - No further syncope since September.  - 30 day event monitor unremarkable.  5. HTN:  - Mildly elevated in clinic today. Usually stable. Will not change meds today.  6. OSA:  -Has mild OSA with AHI 12 and desats down to 82%.  - Intolerant CPAP.  - Have encouraged to use a pulse ox and use 02 prn to keep sats > 90% 7. Pancytopenia:  - Has also seen Dr. Clelia Croft in Hematology - felt like it is due to splenic sequestration.      Eber Ferrufino,MD 10:41 AM

## 2016-12-09 NOTE — Addendum Note (Signed)
Encounter addended by: Kennieth Rad, RN on: 12/09/2016 10:58 AM<BR>    Actions taken: Pharmacy for encounter modified, Visit diagnoses modified, Diagnosis association updated, Order list changed

## 2016-12-15 ENCOUNTER — Ambulatory Visit (INDEPENDENT_AMBULATORY_CARE_PROVIDER_SITE_OTHER): Payer: Medicare Other | Admitting: Family Medicine

## 2016-12-15 ENCOUNTER — Encounter: Payer: Self-pay | Admitting: Family Medicine

## 2016-12-15 DIAGNOSIS — F1029 Alcohol dependence with unspecified alcohol-induced disorder: Secondary | ICD-10-CM

## 2016-12-15 DIAGNOSIS — K766 Portal hypertension: Secondary | ICD-10-CM | POA: Diagnosis not present

## 2016-12-15 DIAGNOSIS — I10 Essential (primary) hypertension: Secondary | ICD-10-CM | POA: Diagnosis not present

## 2016-12-15 DIAGNOSIS — K7030 Alcoholic cirrhosis of liver without ascites: Secondary | ICD-10-CM

## 2016-12-15 DIAGNOSIS — E038 Other specified hypothyroidism: Secondary | ICD-10-CM

## 2016-12-15 DIAGNOSIS — I2721 Secondary pulmonary arterial hypertension: Secondary | ICD-10-CM | POA: Diagnosis not present

## 2016-12-15 MED ORDER — ADULT MULTIVITAMIN W/MINERALS CH: 1.0000 | ORAL_TABLET | Freq: Every day | ORAL | Status: DC

## 2016-12-15 NOTE — Progress Notes (Signed)
Pre visit review using our clinic review tool, if applicable. No additional management support is needed unless otherwise documented below in the visit note. 

## 2016-12-15 NOTE — Patient Instructions (Signed)

## 2016-12-15 NOTE — Progress Notes (Signed)
Patient ID: Darren Mueller, male   DOB: [birth year], 66 y.o.   MRN: [id number]   Subjective:    Patient ID: Darren Mueller, male    DOB: [birth year], 66 y.o.   MRN: [id number]  Chief Complaint  Patient presents with  . Diarrhea    Follow up.    Diarrhea   Pertinent negatives include no coughing, fever, headaches or vomiting.    Patient is in today for a 32-monthfollow up on diarrhea. Patient states the diarrhea is still an ongoing concern. States that he thinks that the diarrhea is due to a medication that he is taking (selexipag). Patient also has a Hx of hypertension,OSA, GERD, hypothyroidism, pancytopemia, insomnia. No additional concerns noted at this time. He has cut down but not stopped his alcohol intake. He denies any recent febrile illness or hospitalizations. Denies CP/palp/HA/congestion/fevers/GI or GU c/o. Taking meds as prescribed  I acted as a sEducation administratorfor SPenni Homans [place]  Past Medical History:  Diagnosis Date  . Alcohol dependence ([place] 03/22/2011   In remission   . Alcohol dependence in remission ([place] 03/22/2011   In remission   . Alcoholic cirrhosis of liver with ascites ([place] 2014  . Dermatitis 06/10/2015  . Diarrhea 09/04/2013  . GERD (gastroesophageal reflux disease)   . Gout   . Heart murmur   . Hx of colonic polyps   . Hypertension   . Hypopotassemia   . Otalgia 11/28/2013  . Other chronic pulmonary heart diseases   . Preventative health care 06/29/2016  . Red eye 03/03/2016   right  . Renal insufficiency 07/18/2013  . Seizures ([place]   . Sleep apnea    does not wear CPAP  . Thrombocytopenia ([place] 06/29/2016  . Unspecified hypothyroidism 01/25/2013  . Unspecified pleural effusion     Past Surgical History:  Procedure Laterality Date  . CARDIAC CATHETERIZATION N/A 12/21/2015   Procedure: Right Heart Cath;  Surgeon: DJolaine Artist MD;  [facility];  Service: Cardiovascular;  Laterality: N/A;  . CARDIAC CATHETERIZATION N/A  04/15/2016   Procedure: Right/Left Heart Cath and Coronary Angiography;  Surgeon: DJolaine Artist MD;  [facility];  Service: Cardiovascular;  Laterality: N/A;  . COLONOSCOPY N/A 12/08/2013   Procedure: COLONOSCOPY;  Surgeon: DMilus Banister MD;  [facility];  Service: Endoscopy;  Laterality: N/A;  . ESOPHAGOGASTRODUODENOSCOPY (EGD) WITH PROPOFOL N/A 03/27/2016   Procedure: ESOPHAGOGASTRODUODENOSCOPY (EGD) WITH PROPOFOL;  Surgeon: DMilus Banister MD;  [facility]  Service: Endoscopy;  Laterality: N/A;  . RIGHT HEART CATHETERIZATION Right 06/06/2014   Procedure: RIGHT HEART CATH;  Surgeon: DJolaine Artist MD;  [facility];  Service: Cardiovascular;  Laterality: Right;  . UVULOPALATOPHARYNGOPLASTY  1999    Family History  Problem Relation Age of Onset  . Heart disease Mother   . Heart attack Mother   . Hypertension Mother   . Prostate cancer Father   . Colon cancer Neg Hx     Social History   Social History  . Marital status: Divorced    Spouse name: N/A  . Number of children: 3  . Years of education: N/A   Occupational History  . Retired     ASocial research officer, government  Social History Main Topics  . Smoking status: Former Smoker    Packs/day: 2.00    Years: 5.00    Types: Cigarettes    Quit date: 09/22/1979  . Smokeless tobacco: Never Used  .  Alcohol use 3.6 oz/week    6 Shots of liquor per week     Comment: 06/04/2016 "2 vodka & tonics on MWF"  . Drug use: No  . Sexual activity: Not Currently   Other Topics Concern  . Not on file   Social History Narrative   0 caffeine drinks daily     Outpatient Medications Prior to Visit  Medication Sig Dispense Refill  . albuterol (PROVENTIL HFA;VENTOLIN HFA) 108 (90 BASE) MCG/ACT inhaler Inhale 2 puffs into the lungs every 6 (six) hours as needed for wheezing or shortness of breath.    . doxepin (SINEQUAN) 10 MG capsule TAKE 1 TO 2 CAPSULES AT BEDTIME 180 capsule 1  . Fluticasone  Propionate 0.05 % LOTN Apply daily to affected area (Patient taking differently: Apply 1 application topically daily. Apply daily to affected area) 3 Bottle 3  . furosemide (LASIX) 40 MG tablet Take 1 tablet (40 mg total) by mouth daily. Take as directed on Monday and Friday 30 tablet 3  . HYDROcodone-acetaminophen (NORCO) 5-325 MG tablet Take 1 tablet by mouth every 6 (six) hours as needed for moderate pain. 20 tablet 0  . levothyroxine (SYNTHROID, LEVOTHROID) 25 MCG tablet TAKE 1 TABLET DAILY 90 tablet 1  . loperamide (ANTI-DIARRHEAL) 2 MG capsule Take 4 mg by mouth as needed for diarrhea or loose stools (up to 8 capsules per day).     Marland Kitchen losartan (COZAAR) 50 MG tablet Take 50 mg by mouth daily.    . Macitentan (OPSUMIT) 10 MG TABS Take 10 mg by mouth daily. 90 tablet 3  . methocarbamol (ROBAXIN) 750 MG tablet Take 1 tablet (750 mg total) by mouth 3 (three) times daily. 30 tablet 0  . mometasone-formoterol (DULERA) 100-5 MCG/ACT AERO Inhale 2 puffs into the lungs daily.     . Multiple Vitamin (MULTIVITAMIN WITH MINERALS) TABS tablet Take 1 tablet by mouth daily.    Marland Kitchen omeprazole (PRILOSEC) 40 MG capsule TAKE 1 CAPSULE DAILY 90 capsule 2  . potassium chloride (K-DUR,KLOR-CON) 10 MEQ tablet Take 10 mEq by mouth daily.    . Selexipag (UPTRAVI) 1000 MCG TABS Take 1,000 mg by mouth every morning. 90 tablet 2  . Selexipag (UPTRAVI) 800 MCG TABS Take 1 tablet (800 mcg total) by mouth every evening. 90 tablet 2  . spironolactone (ALDACTONE) 50 MG tablet TAKE 1 TABLET DAILY 90 tablet 0  . SYMAX-SR 0.375 MG 12 hr tablet TAKE 1 TABLET TWICE A DAY 180 tablet 1  . tadalafil, PAH, (ADCIRCA) 20 MG tablet TAKE 2 TABLETS (40 MG TOTAL) DAILY 180 tablet 2  . traMADol (ULTRAM) 50 MG tablet Take 1 tablet (50 mg total) by mouth every 12 (twelve) hours as needed for moderate pain or severe pain. 10 tablet 0   No facility-administered medications prior to visit.     Allergies  Allergen Reactions  . Aspirin Other  (See Comments)    hypertension    Review of Systems  Constitutional: Positive for malaise/fatigue. Negative for fever.  HENT: Negative for congestion.   Eyes: Negative for blurred vision.  Respiratory: Negative for cough and shortness of breath.   Cardiovascular: Negative for chest pain, palpitations and leg swelling.  Gastrointestinal: Positive for diarrhea. Negative for vomiting.  Musculoskeletal: Negative for back pain.  Skin: Negative for rash.  Neurological: Negative for loss of consciousness and headaches.       Objective:    Physical Exam  Constitutional: He is oriented to person, place, and time. He appears well-developed  and well-nourished. No distress.  HENT:  Head: Normocephalic and atraumatic.  Eyes: Conjunctivae are normal.  Neck: Normal range of motion. No thyromegaly present.  Cardiovascular: Normal rate and regular rhythm.   Murmur heard. Pulmonary/Chest: Effort normal and breath sounds normal. He has no wheezes.  Abdominal: Soft. Bowel sounds are normal. There is no tenderness.  Musculoskeletal: He exhibits no edema or deformity.  Neurological: He is alert and oriented to person, place, and time.  Skin: Skin is warm and dry. He is not diaphoretic.  Psychiatric: He has a normal mood and affect.    BP 128/60 (BP Location: Left Arm, Patient Position: Sitting, Cuff Size: Large)   Pulse 98   Temp 97.6 F (36.4 C) (Oral)   Wt 203 lb 9.6 oz (92.4 kg)   SpO2 95% Comment: RA  BMI 27.61 kg/m  Wt Readings from Last 3 Encounters:  12/15/16 203 lb 9.6 oz (92.4 kg)  12/09/16 202 lb 4 oz (91.7 kg)  12/02/16 205 lb 4.8 oz (93.1 kg)     Lab Results  Component Value Date   WBC 2.4 (L) 10/21/2016   HGB 13.1 10/21/2016   HCT 38.8 10/21/2016   PLT 36 Large platelets present (L) 10/21/2016   GLUCOSE 97 09/11/2016   CHOL 198 06/20/2016   TRIG 485.0 (H) 06/20/2016   HDL 62.40 06/20/2016   LDLDIRECT 68.0 06/20/2016   LDLCALC 77 12/04/2015   ALT 22 09/11/2016    AST 48 (H) 09/11/2016   NA 138 09/11/2016   K 4.1 09/11/2016   CL 107 09/11/2016   CREATININE 1.36 09/11/2016   BUN 24 (H) 09/11/2016   CO2 24 09/11/2016   TSH 3.47 06/20/2016   PSA 0.49 11/01/2012   INR 1.25 06/04/2016   HGBA1C 5.4 03/02/2015    Lab Results  Component Value Date   TSH 3.47 06/20/2016   Lab Results  Component Value Date   WBC 2.4 (L) 10/21/2016   HGB 13.1 10/21/2016   HCT 38.8 10/21/2016   MCV 103.7 (H) 10/21/2016   PLT 36 Large platelets present (L) 10/21/2016   Lab Results  Component Value Date   NA 138 09/11/2016   K 4.1 09/11/2016   CO2 24 09/11/2016   GLUCOSE 97 09/11/2016   BUN 24 (H) 09/11/2016   CREATININE 1.36 09/11/2016   BILITOT 0.9 09/11/2016   ALKPHOS 166 (H) 09/11/2016   AST 48 (H) 09/11/2016   ALT 22 09/11/2016   PROT 7.7 09/11/2016   ALBUMIN 4.1 09/11/2016   CALCIUM 9.3 09/11/2016   ANIONGAP 8 07/20/2016   GFR 67.30 09/11/2016   Lab Results  Component Value Date   CHOL 198 06/20/2016   Lab Results  Component Value Date   HDL 62.40 06/20/2016   Lab Results  Component Value Date   LDLCALC 77 12/04/2015   Lab Results  Component Value Date   TRIG 485.0 (H) 06/20/2016   Lab Results  Component Value Date   CHOLHDL 3 06/20/2016   Lab Results  Component Value Date   HGBA1C 5.4 03/02/2015       Assessment & Plan:   Problem List Items Addressed This Visit    None      I am having Mr. Westrup maintain his loperamide, Macitentan, Fluticasone Propionate, albuterol, Selexipag, Selexipag, mometasone-formoterol, furosemide, levothyroxine, losartan, methocarbamol, multivitamin with minerals, traMADol, HYDROcodone-acetaminophen, omeprazole, SYMAX-SR, potassium chloride, doxepin, spironolactone, and tadalafil (PAH).  No orders of the defined types were placed in this encounter.   CMA served as Education administrator during  this visit. History, Physical and Plan performed by medical provider. Documentation and orders reviewed and attested  to.  Shan Levans, CMA

## 2016-12-22 ENCOUNTER — Telehealth: Payer: Self-pay | Admitting: Family Medicine

## 2016-12-22 NOTE — Assessment & Plan Note (Signed)
Continues to follow closely with cardiology and is stable on current meds.

## 2016-12-22 NOTE — Assessment & Plan Note (Signed)
Patient reports a decrease in intake, is encourage to attempt complete cessation, he is non comittal but will continue to maintain decreased intake

## 2016-12-22 NOTE — Telephone Encounter (Signed)
Patient scheduled AWV 03/17/17 at 7:30am with Hoyle Sauer and PCP at [facility].

## 2016-12-22 NOTE — Assessment & Plan Note (Signed)
On Levothyroxine, continue to monitor 

## 2016-12-22 NOTE — Assessment & Plan Note (Signed)
Well controlled, no changes to meds. Encouraged heart healthy diet such as the DASH diet and exercise as tolerated.

## 2016-12-22 NOTE — Assessment & Plan Note (Signed)
Encouraged complete cessation.

## 2016-12-31 ENCOUNTER — Emergency Department (HOSPITAL_COMMUNITY): Payer: Medicare Other

## 2016-12-31 ENCOUNTER — Encounter (HOSPITAL_COMMUNITY): Payer: Self-pay | Admitting: Emergency Medicine

## 2016-12-31 ENCOUNTER — Inpatient Hospital Stay (HOSPITAL_COMMUNITY)
Admission: EM | Admit: 2016-12-31 | Discharge: 2017-01-02 | DRG: 982 | Disposition: A | Discharge status: Home / Self Care | Payer: Medicare Other | Attending: Internal Medicine | Admitting: Internal Medicine

## 2016-12-31 DIAGNOSIS — I2721 Secondary pulmonary arterial hypertension: Secondary | ICD-10-CM | POA: Diagnosis not present

## 2016-12-31 DIAGNOSIS — D696 Thrombocytopenia, unspecified: Secondary | ICD-10-CM

## 2016-12-31 DIAGNOSIS — I509 Heart failure, unspecified: Secondary | ICD-10-CM | POA: Diagnosis not present

## 2016-12-31 DIAGNOSIS — K766 Portal hypertension: Secondary | ICD-10-CM | POA: Diagnosis present

## 2016-12-31 DIAGNOSIS — Z87891 Personal history of nicotine dependence: Secondary | ICD-10-CM | POA: Diagnosis not present

## 2016-12-31 DIAGNOSIS — G4733 Obstructive sleep apnea (adult) (pediatric): Secondary | ICD-10-CM | POA: Diagnosis present

## 2016-12-31 DIAGNOSIS — N183 Chronic kidney disease, stage 3 (moderate): Secondary | ICD-10-CM | POA: Diagnosis present

## 2016-12-31 DIAGNOSIS — K7030 Alcoholic cirrhosis of liver without ascites: Secondary | ICD-10-CM | POA: Diagnosis present

## 2016-12-31 DIAGNOSIS — R0602 Shortness of breath: Secondary | ICD-10-CM | POA: Diagnosis not present

## 2016-12-31 DIAGNOSIS — I130 Hypertensive heart and chronic kidney disease with heart failure and stage 1 through stage 4 chronic kidney disease, or unspecified chronic kidney disease: Secondary | ICD-10-CM | POA: Diagnosis present

## 2016-12-31 DIAGNOSIS — D61818 Other pancytopenia: Secondary | ICD-10-CM | POA: Diagnosis present

## 2016-12-31 DIAGNOSIS — R55 Syncope and collapse: Secondary | ICD-10-CM | POA: Diagnosis not present

## 2016-12-31 DIAGNOSIS — E039 Hypothyroidism, unspecified: Secondary | ICD-10-CM | POA: Diagnosis present

## 2016-12-31 DIAGNOSIS — Z7951 Long term (current) use of inhaled steroids: Secondary | ICD-10-CM

## 2016-12-31 DIAGNOSIS — K219 Gastro-esophageal reflux disease without esophagitis: Secondary | ICD-10-CM | POA: Diagnosis present

## 2016-12-31 DIAGNOSIS — I5032 Chronic diastolic (congestive) heart failure: Secondary | ICD-10-CM | POA: Diagnosis present

## 2016-12-31 DIAGNOSIS — J449 Chronic obstructive pulmonary disease, unspecified: Secondary | ICD-10-CM | POA: Diagnosis present

## 2016-12-31 DIAGNOSIS — I270 Primary pulmonary hypertension: Secondary | ICD-10-CM | POA: Diagnosis not present

## 2016-12-31 DIAGNOSIS — I493 Ventricular premature depolarization: Secondary | ICD-10-CM | POA: Diagnosis not present

## 2016-12-31 DIAGNOSIS — I272 Pulmonary hypertension, unspecified: Secondary | ICD-10-CM | POA: Diagnosis present

## 2016-12-31 DIAGNOSIS — Z79899 Other long term (current) drug therapy: Secondary | ICD-10-CM | POA: Diagnosis not present

## 2016-12-31 DIAGNOSIS — I110 Hypertensive heart disease with heart failure: Secondary | ICD-10-CM | POA: Diagnosis not present

## 2016-12-31 DIAGNOSIS — R404 Transient alteration of awareness: Secondary | ICD-10-CM | POA: Diagnosis not present

## 2016-12-31 LAB — BASIC METABOLIC PANEL
Anion gap: 12 (ref 5–15)
BUN: 21 mg/dL — AB (ref 6–20)
CO2: 19 mmol/L — ABNORMAL LOW (ref 22–32)
CREATININE: 1.45 mg/dL — AB (ref 0.61–1.24)
Calcium: 9.7 mg/dL (ref 8.9–10.3)
Chloride: 108 mmol/L (ref 101–111)
GFR calc Af Amer: 56 mL/min — ABNORMAL LOW (ref >60)
GFR, EST NON AFRICAN AMERICAN: 49 mL/min — AB (ref >60)
GLUCOSE: 86 mg/dL (ref 65–99)
POTASSIUM: 4 mmol/L (ref 3.5–5.1)
SODIUM: 139 mmol/L (ref 135–145)

## 2016-12-31 LAB — URINALYSIS, ROUTINE W REFLEX MICROSCOPIC
BACTERIA UA: NONE SEEN
Bilirubin Urine: NEGATIVE
Glucose, UA: NEGATIVE mg/dL
Ketones, ur: NEGATIVE mg/dL
LEUKOCYTES UA: NEGATIVE
NITRITE: NEGATIVE
PH: 5 (ref 5.0–8.0)
Protein, ur: 100 mg/dL — AB
RBC / HPF: NONE SEEN RBC/hpf (ref 0–5)
SPECIFIC GRAVITY, URINE: 1.016 (ref 1.005–1.030)

## 2016-12-31 LAB — CBC
HEMATOCRIT: 35.3 % — AB (ref 39.0–52.0)
Hemoglobin: 12.5 g/dL — ABNORMAL LOW (ref 13.0–17.0)
MCH: 36.8 pg — ABNORMAL HIGH (ref 26.0–34.0)
MCHC: 35.4 g/dL (ref 30.0–36.0)
MCV: 103.8 fL — AB (ref 78.0–100.0)
PLATELETS: 36 10*3/uL — AB (ref 150–400)
RBC: 3.4 MIL/uL — ABNORMAL LOW (ref 4.22–5.81)
RDW: 13.9 % (ref 11.5–15.5)
WBC: 2 10*3/uL — AB (ref 4.0–10.5)

## 2016-12-31 LAB — BRAIN NATRIURETIC PEPTIDE: B Natriuretic Peptide: 589.6 pg/mL — ABNORMAL HIGH (ref 0.0–100.0)

## 2016-12-31 LAB — MAGNESIUM: MAGNESIUM: 1.5 mg/dL — AB (ref 1.7–2.4)

## 2016-12-31 LAB — ETHANOL: ALCOHOL ETHYL (B): 36 mg/dL — AB (ref <5)

## 2016-12-31 LAB — TROPONIN I: TROPONIN I: 0.03 ng/mL — AB (ref <0.03)

## 2016-12-31 MED ORDER — SPIRONOLACTONE 25 MG PO TABS
50.0000 mg | ORAL_TABLET | Freq: Every day | ORAL | Status: DC
  Administered 2016-12-31 – 2017-01-02 (×3): 50 mg via ORAL
  Filled 2016-12-31 (×3): qty 2

## 2016-12-31 MED ORDER — HYOSCYAMINE SULFATE ER 0.375 MG PO TB12
0.3750 mg | ORAL_TABLET | Freq: Two times a day (BID) | ORAL | Status: DC
  Administered 2016-12-31 – 2017-01-01 (×4): 0.375 mg via ORAL
  Filled 2016-12-31 (×5): qty 1

## 2016-12-31 MED ORDER — POTASSIUM CHLORIDE CRYS ER 10 MEQ PO TBCR
10.0000 meq | EXTENDED_RELEASE_TABLET | Freq: Every day | ORAL | Status: DC
  Administered 2016-12-31 – 2017-01-02 (×3): 10 meq via ORAL
  Filled 2016-12-31 (×3): qty 1

## 2016-12-31 MED ORDER — LOSARTAN POTASSIUM 50 MG PO TABS
50.0000 mg | ORAL_TABLET | Freq: Every day | ORAL | Status: DC
  Administered 2016-12-31 – 2017-01-02 (×3): 50 mg via ORAL
  Filled 2016-12-31 (×3): qty 1

## 2016-12-31 MED ORDER — SODIUM CHLORIDE 0.9% FLUSH
3.0000 mL | Freq: Two times a day (BID) | INTRAVENOUS | Status: DC
  Administered 2016-12-31 – 2017-01-02 (×5): 3 mL via INTRAVENOUS

## 2016-12-31 MED ORDER — FLUTICASONE PROPIONATE 0.05 % EX LOTN: 1.0000 "application " | TOPICAL_LOTION | Freq: Every day | CUTANEOUS | Status: DC

## 2016-12-31 MED ORDER — SELEXIPAG 800 MCG PO TABS: 800.0000 ug | ORAL_TABLET | Freq: Every evening | ORAL | Status: DC

## 2016-12-31 MED ORDER — SELEXIPAG 1000 MCG PO TABS: 1000.0000 ug | ORAL_TABLET | Freq: Every morning | ORAL | Status: DC

## 2016-12-31 MED ORDER — DOXEPIN HCL 10 MG PO CAPS
20.0000 mg | ORAL_CAPSULE | Freq: Every evening | ORAL | Status: DC | PRN
  Administered 2017-01-02: 20 mg via ORAL
  Filled 2016-12-31: qty 2

## 2016-12-31 MED ORDER — PANTOPRAZOLE SODIUM 40 MG PO TBEC
40.0000 mg | DELAYED_RELEASE_TABLET | Freq: Every day | ORAL | Status: DC
  Administered 2016-12-31 – 2017-01-02 (×3): 40 mg via ORAL
  Filled 2016-12-31 (×3): qty 1

## 2016-12-31 MED ORDER — LOPERAMIDE HCL 2 MG PO CAPS: 4.0000 mg | ORAL_CAPSULE | ORAL | Status: DC | PRN

## 2016-12-31 MED ORDER — SODIUM CHLORIDE 0.9% FLUSH: 3.0000 mL | INTRAVENOUS | Status: DC | PRN

## 2016-12-31 MED ORDER — ONDANSETRON HCL 4 MG/2ML IJ SOLN: 4.0000 mg | Freq: Four times a day (QID) | INTRAMUSCULAR | Status: DC | PRN

## 2016-12-31 MED ORDER — ALBUTEROL SULFATE (2.5 MG/3ML) 0.083% IN NEBU: 2.5000 mg | INHALATION_SOLUTION | Freq: Four times a day (QID) | RESPIRATORY_TRACT | Status: DC | PRN

## 2016-12-31 MED ORDER — ADULT MULTIVITAMIN W/MINERALS CH
1.0000 | ORAL_TABLET | Freq: Every day | ORAL | Status: DC
  Administered 2016-12-31 – 2017-01-02 (×3): 1 via ORAL
  Filled 2016-12-31 (×3): qty 1

## 2016-12-31 MED ORDER — ACETAMINOPHEN 325 MG PO TABS
650.0000 mg | ORAL_TABLET | ORAL | Status: DC | PRN
  Administered 2017-01-01: 650 mg via ORAL
  Filled 2016-12-31: qty 2

## 2016-12-31 MED ORDER — MOMETASONE FURO-FORMOTEROL FUM 100-5 MCG/ACT IN AERO
2.0000 | INHALATION_SPRAY | Freq: Every day | RESPIRATORY_TRACT | Status: DC
  Administered 2017-01-02: 2 via RESPIRATORY_TRACT
  Filled 2016-12-31: qty 8.8

## 2016-12-31 MED ORDER — SODIUM CHLORIDE 0.9 % IV SOLN: 250.0000 mL | INTRAVENOUS | Status: DC | PRN

## 2016-12-31 MED ORDER — MAGNESIUM SULFATE 4 GM/100ML IV SOLN
4.0000 g | Freq: Once | INTRAVENOUS | Status: AC
  Administered 2016-12-31: 4 g via INTRAVENOUS
  Filled 2016-12-31: qty 100

## 2016-12-31 MED ORDER — FUROSEMIDE 40 MG PO TABS: 40.0000 mg | ORAL_TABLET | ORAL | Status: DC

## 2016-12-31 MED ORDER — MACITENTAN 10 MG PO TABS: 10.0000 mg | ORAL_TABLET | Freq: Every day | ORAL | Status: DC

## 2016-12-31 MED ORDER — LEVOTHYROXINE SODIUM 25 MCG PO TABS
25.0000 ug | ORAL_TABLET | Freq: Every day | ORAL | Status: DC
  Administered 2017-01-02: 25 ug via ORAL
  Filled 2016-12-31 (×3): qty 1

## 2016-12-31 MED ORDER — TADALAFIL (PAH) 20 MG PO TABS
40.0000 mg | ORAL_TABLET | Freq: Every day | ORAL | Status: DC
  Administered 2016-12-31 – 2017-01-01 (×2): 40 mg via ORAL
  Filled 2016-12-31 (×3): qty 2

## 2016-12-31 NOTE — ED Provider Notes (Signed)
[name] DEPT Provider Note   CSN: [id number] Arrival date & time: 12/31/16  0151  By signing my name below, I, Darren Mueller, attest that this documentation has been prepared under the direction and in the presence of Darren Fraise, MD. Electronically Signed: Oleh Mueller, Scribe. 12/31/16. 2:45 AM.   History   Chief Complaint Chief Complaint  Patient presents with  . Loss of Consciousness    HPI Darren Mueller is a 66 y.o. male with history of hypertension, pulmonary hypertension, and renal insufficiency who presents to the ED for evaluation of syncope. This patient states that approximately 4 hours ago he was cooking and acutely lost consciousness. He woke on the ground after unknown downtime. He is amnestic to the precipitating events of his syncope. He struck his head against a shelf. At interview, he is reporting mild posterior head pain. Denies any neck pain. He is reporting mild dyspnea which he believes to be the result of a side effect of his medication. He denies any chest pain, focal weaknesses, abdominal pain, fevers, or vomiting. He is not on home oxygen. The patient has 3 previous visits for syncope of unclear etiology.  Reports he was cooking during episode but no gas exposure (electrict stove per patient)  The history is provided by the patient. No language interpreter was used.    Past Medical History:  Diagnosis Date  . Alcohol dependence ([place]) 03/22/2011   In remission   . Alcohol dependence in remission ([place]) 03/22/2011   In remission   . Alcoholic cirrhosis of liver with ascites ([place]) 2014  . Dermatitis 06/10/2015  . Diarrhea 09/04/2013  . GERD (gastroesophageal reflux disease)   . Gout   . Heart murmur   . Hx of colonic polyps   . Hypertension   . Hypopotassemia   . Otalgia 11/28/2013  . Other chronic pulmonary heart diseases   . Preventative health care 06/29/2016  . Red eye 03/03/2016   right  . Renal insufficiency 07/18/2013  . Seizures  ([place])   . Sleep apnea    does not wear CPAP  . Thrombocytopenia ([place]) 06/29/2016  . Unspecified hypothyroidism 01/25/2013  . Unspecified pleural effusion     Patient Active Problem List   Diagnosis Date Noted  . Right rib fracture   . Syncope 07/19/2016  . Thrombocytopenia ([place]) 06/29/2016  . Hoarseness, chronic 06/29/2016  . Preventative health care 06/29/2016  . Midline low back pain without sciatica 12/09/2015  . Cirrhosis with alcoholism ([place]) 08/08/2015  . Pancytopenia ([place]) 06/10/2015  . Dermatitis 06/10/2015  . OSA (obstructive sleep apnea) 02/13/2015  . Gout 07/30/2014  . Diarrhea 09/04/2013  . Chronic renal insufficiency, stage III (moderate) 07/18/2013  . Right heart failure due to pulmonary hypertension 06/06/2013  . Hypothyroidism 01/25/2013  . PAH (pulmonary arterial hypertension) with portal hypertension ([place]) 01/21/2013  . Allergic rhinitis 12/21/2012  . Secondary pulmonary hypertension 12/09/2012  . Hepatic cirrhosis ([place]) 11/26/2012  . COPD bronchitis 11/01/2012  . GERD (gastroesophageal reflux disease) 09/05/2011  . Hypokalemia 08/29/2011  . Colon polyps 03/26/2011  . Insomnia 03/26/2011  . Hypertension 03/22/2011  . Alcohol dependence ([place]) 03/22/2011    Past Surgical History:  Procedure Laterality Date  . CARDIAC CATHETERIZATION N/A 12/21/2015   Procedure: Right Heart Cath;  Surgeon: Jolaine Artist, MD;  Location: [facility];  Service: Cardiovascular;  Laterality: N/A;  . CARDIAC CATHETERIZATION N/A 04/15/2016   Procedure: Right/Left Heart Cath and Coronary Angiography;  Surgeon: Jolaine Artist, MD;  Location: [facility]  INVASIVE CV LAB;  Service: Cardiovascular;  Laterality: N/A;  . COLONOSCOPY N/A 12/08/2013   Procedure: COLONOSCOPY;  Surgeon: Milus Banister, MD;  [facility];  Service: Endoscopy;  Laterality: N/A;  . ESOPHAGOGASTRODUODENOSCOPY (EGD) WITH PROPOFOL N/A 03/27/2016   Procedure: ESOPHAGOGASTRODUODENOSCOPY (EGD) WITH  PROPOFOL;  Surgeon: Milus Banister, MD;  Location: [facility];  Service: Endoscopy;  Laterality: N/A;  . RIGHT HEART CATHETERIZATION Right 06/06/2014   Procedure: RIGHT HEART CATH;  Surgeon: Jolaine Artist, MD;  Location[facility] CATH LAB;  Service: Cardiovascular;  Laterality: Right;  . UVULOPALATOPHARYNGOPLASTY  1999       Home Medications    Prior to Admission medications   Medication Sig Start Date End Date Taking? Authorizing Provider  albuterol (PROVENTIL HFA;VENTOLIN HFA) 108 (90 BASE) MCG/ACT inhaler Inhale 2 puffs into the lungs every 6 (six) hours as needed for wheezing or shortness of breath.   Yes Historical Provider, MD  doxepin (SINEQUAN) 10 MG capsule TAKE 1 TO 2 CAPSULES AT BEDTIME Patient taking differently: Take 20 mg by mouth at bedtime as needed for sleep 10/13/16  Yes Mosie Lukes, MD  Fluticasone Propionate 0.05 % LOTN Apply daily to affected area Patient taking differently: Apply 1 application topically daily. Apply daily to affected area 05/15/15  Yes Mosie Lukes, MD  furosemide (LASIX) 40 MG tablet Take 1 tablet (40 mg total) by mouth daily. Take as directed on Monday and Friday Patient taking differently: Take 40 mg by mouth every Monday, Wednesday, and Friday.  06/06/16  Yes Orson Eva, MD  levothyroxine (SYNTHROID, LEVOTHROID) 25 MCG tablet TAKE 1 TABLET DAILY 07/13/16  Yes Mosie Lukes, MD  loperamide (ANTI-DIARRHEAL) 2 MG capsule Take 4 mg by mouth as needed for diarrhea or loose stools (up to 8 capsules per day).    Yes Historical Provider, MD  losartan (COZAAR) 50 MG tablet Take 50 mg by mouth daily.   Yes Historical Provider, MD  Macitentan (OPSUMIT) 10 MG TABS Take 10 mg by mouth daily. 03/23/14  Yes Shaune Pascal Bensimhon, MD  mometasone-formoterol (DULERA) 100-5 MCG/ACT AERO Inhale 2 puffs into the lungs daily.    Yes Historical Provider, MD  Multiple Vitamin (MULTIVITAMIN WITH MINERALS) TABS tablet Take 1 tablet by mouth daily. 12/15/16  Yes Mosie Lukes,  MD  omeprazole (PRILOSEC) 40 MG capsule TAKE 1 CAPSULE DAILY 08/31/16  Yes Mosie Lukes, MD  potassium chloride (K-DUR,KLOR-CON) 10 MEQ tablet Take 10 mEq by mouth daily.   Yes Historical Provider, MD  Selexipag (UPTRAVI) 1000 MCG TABS Take 1,000 mg by mouth every morning. 11/13/15  Yes Shaune Pascal Bensimhon, MD  Selexipag (UPTRAVI) 800 MCG TABS Take 1 tablet (800 mcg total) by mouth every evening. 11/13/15  Yes Jolaine Artist, MD  spironolactone (ALDACTONE) 50 MG tablet TAKE 1 TABLET DAILY 12/01/16  Yes Mosie Lukes, MD  SYMAX-SR 0.375 MG 12 hr tablet TAKE 1 TABLET TWICE A DAY 08/31/16  Yes Mosie Lukes, MD  tadalafil, PAH, (ADCIRCA) 20 MG tablet TAKE 2 TABLETS (40 MG TOTAL) DAILY 12/09/16  Yes Jolaine Artist, MD  HYDROcodone-acetaminophen (NORCO) 5-325 MG tablet Take 1 tablet by mouth every 6 (six) hours as needed for moderate pain. Patient not taking: Reported on 12/15/2016 07/22/16   Mackie Pai, PA-C  methocarbamol (ROBAXIN) 750 MG tablet Take 1 tablet (750 mg total) by mouth 3 (three) times daily. Patient not taking: Reported on 12/31/2016 07/20/16   Dron Tanna Furry, MD    Family  History Family History  Problem Relation Age of Onset  . Heart disease Mother   . Heart attack Mother   . Hypertension Mother   . Prostate cancer Father   . Colon cancer Neg Hx     Social History Social History  Substance Use Topics  . Smoking status: Former Smoker    Packs/day: 2.00    Years: 5.00    Types: Cigarettes    Quit date: 09/22/1979  . Smokeless tobacco: Never Used  . Alcohol use 3.6 oz/week    6 Shots of liquor per week     Comment: 06/04/2016 "2 vodka & tonics on MWF"     Allergies   Aspirin   Review of Systems Review of Systems  Constitutional: Negative for fever.  Respiratory: Positive for shortness of breath.   Gastrointestinal: Negative for abdominal pain and vomiting.  Musculoskeletal: Negative for neck pain.  Neurological: Positive for headaches. Negative for  weakness.  All other systems reviewed and are negative.    Physical Exam Updated Vital Signs BP 153/99   Pulse 84   Temp 98.4 F (36.9 C) (Oral)   Resp 19   SpO2 96%   Physical Exam CONSTITUTIONAL: Well developed/well nourished HEAD: Normocephalic/atraumatic; no bruising or signs of trauma EYES: EOMI/PERRL ENMT: Mucous membranes moist NECK: supple no meningeal signs SPINE/BACK:entire spine nontender, No bruising/crepitance/stepoffs noted to spine CV: S1/S2 noted, no murmurs/rubs/gallops noted LUNGS: Crackles in the R lung base, no apparent distress ABDOMEN: soft, nontender, no rebound or guarding, bowel sounds noted throughout abdomen GU:no cva tenderness NEURO: Pt is awake/alert/appropriate, moves all extremitiesx4.  No facial droop.   EXTREMITIES: pulses normal/equal, full ROM, no visible signs of trauma SKIN: warm, color normal PSYCH: no abnormalities of mood noted, alert and oriented to situation   ED Treatments / Results  DIAGNOSTIC STUDIES: Oxygen Saturation is 97 percent on nasal cannula which is normal by my interpretation.    COORDINATION OF CARE: 2:45 AM Discussed treatment plan with pt at bedside and pt agreed to plan.  Labs (all labs ordered are listed, but only abnormal results are displayed) Labs Reviewed  BASIC METABOLIC PANEL - Abnormal; Notable for the following:       Result Value   CO2 19 (*)    BUN 21 (*)    Creatinine, Ser 1.45 (*)    GFR calc non Af Amer 49 (*)    GFR calc Af Amer 56 (*)    All other components within normal limits  CBC - Abnormal; Notable for the following:    WBC 2.0 (*)    RBC 3.40 (*)    Hemoglobin 12.5 (*)    HCT 35.3 (*)    MCV 103.8 (*)    MCH 36.8 (*)    Platelets 36 (*)    All other components within normal limits  URINALYSIS, ROUTINE W REFLEX MICROSCOPIC - Abnormal; Notable for the following:    Hgb urine dipstick SMALL (*)    Protein, ur 100 (*)    Squamous Epithelial / LPF 0-5 (*)    All other components  within normal limits  ETHANOL - Abnormal; Notable for the following:    Alcohol, Ethyl (B) 36 (*)    All other components within normal limits  TROPONIN I - Abnormal; Notable for the following:    Troponin I 0.03 (*)    All other components within normal limits  BRAIN NATRIURETIC PEPTIDE - Abnormal; Notable for the following:    B Natriuretic Peptide 589.6 (*)  All other components within normal limits    EKG ED ECG REPORT   Date: 12/31/2016  0201am  Rate: 84  Rhythm: normal sinus rhythm  QRS Axis: right  Intervals: normal  ST/T Wave abnormalities: ST depressions anteriorly  Conduction Disutrbances:first-degree A-V block   Narrative Interpretation:   Old EKG Reviewed: unchanged  I have personally reviewed the EKG tracing and agree with the computerized printout as noted.  Radiology Dg Chest 2 View  Result Date: 12/31/2016 CLINICAL DATA:  Acute onset of syncope and fall. Concern for chest injury. Shortness of breath and tachycardia. Initial encounter. EXAM: CHEST  2 VIEW COMPARISON:  Chest radiograph performed 12/02/2016 FINDINGS: The lungs are well-aerated. Vascular congestion is noted. Mildly increased interstitial markings may reflect transient interstitial edema. There is no evidence of pleural effusion or pneumothorax. The heart is enlarged. No acute osseous abnormalities are seen. Chronic bilateral rib deformities are noted. IMPRESSION: Vascular congestion and cardiomegaly. Mildly increased interstitial markings may reflect transient interstitial edema. Electronically Signed   By: Garald Balding M.D.   On: 12/31/2016 03:29    Procedures Procedures  Medications Ordered in ED Medications - No data to display   Initial Impression / Assessment and Plan / ED Course  I have reviewed the triage vital signs and the nursing notes.  Pertinent labs & imaging results that were available during my care of the patient were reviewed by me and considered in my medical decision making  (see chart for details).     6:44 AM Pt stable, no acute worsening, denies HA/vomiting at this time (no need for head imaging after fall) In the ED for syncopal episode while cooking He has complex cardiac history including Right HF and pulmonary HTN He is known to CHF team Labs near baseline except mild pulmonary edema  Due to complexity of chronic illness with syncope, will consult cardiology I spoke to on call cardiology, and he will be evaluated by heart failure team in one hour Pt agreeable with plan   Final Clinical Impressions(s) / ED Diagnoses   Final diagnoses:  Syncope, unspecified syncope type  Acute on chronic congestive heart failure, unspecified congestive heart failure type ([place])  Thrombocytopenia ([place])    New Prescriptions New Prescriptions   No medications on file  I personally performed the services described in this documentation, which was scribed in my presence. The recorded information has been reviewed and is accurate.       Darren Fraise, MD 12/31/16 [phone]

## 2016-12-31 NOTE — ED Notes (Signed)
EDP at bedside  

## 2016-12-31 NOTE — Progress Notes (Signed)
12/31/2016 1350 Received pt to room 2W08 from ED with dx of Syncope.  Pt is A&O, no c/o voiced.  Tele monitor applied and CCMD notified.  Oriented to room, call light and bed.  Call bell in reach. [place]

## 2016-12-31 NOTE — ED Notes (Signed)
Cardiology at bedside.

## 2016-12-31 NOTE — H&P (Signed)
Advanced Heart Failure Team History and Physical Note   Primary Physician:  Dr Charlett Blake Primary Cardiologist: Dr. Haroldine Laws  Referring MD: Dr. Christy Gentles  Reason for Admission: Syncope   HPI:    Darren Mueller is a 66 y.o. male with history of COPD, portopulmonary hypertension with RV failure, and ETOH cirrhosis.   Admitted in 6/17 with CP and SOB. Was diuresed. Troponins normal. Underwent R/L cath. Minimal CAD with moderate PAH and preserved cardiac output.   Admitted 06/2016 and 07/2016 with syncope.  On September admission found to have elevated ETOH level as well as R 6th rib fracture.   Wore 30 day monitory up until 08/28/16. No AF noted. No events.  9% tachycardia.   Seen in HF clinic 12/09/16 and thought to be stable. Had been treated recently for a URI.  Was c/o pre-syncope on occasion with coughing.  Stated he was drinking 2 days per week.   Pt presented to [facility] overnight with syncope.  States he was standing at his counter in his kitchen baking a pecan pie.  Pt then had syncope without any preceding symptoms. States the timer on the oven was 15 minutes, and then when he came to it was down to 7 minutes. Denies coughing fit before hand.  States he was in usual state of health prior to yesterday.  Pertinent admission labs include K 4.0, Creatinine 1.45, BNP 589.6, Troponin 0.03, Hgb 12.5. UA negative.  Serum Ethyl Alcohol 36 (Normal range <5).   Pt denies any HF symptoms. No orthopnea, CP, or lightheadedness apart from syncope last night.  Denies edema.  Still drinking on occasion.  Taking all medications as directed. Pt did hit his head.  Denies blurred vision, HA, or neurological symptoms.    Most Recent RHC 04/2016 Ao = 107/70 (87) LV = 106/3/11 RA = 4 RV = 74/11 PA = 74/26 (45) PCW = 6 Fick cardiac output/index = 4.6.2.2 PVR = 8.5 Ao sat = 95% PA sat = 61%, 58%  Review of Systems: [y] = yes, _0  = no   General: Weight gain _1 ; Weight loss _2 ; Anorexia _3 ; Fatigue  _4 ; Fever _5 ; Chills _6 ; Weakness _7   Cardiac: Chest pain/pressure _8 ; Resting SOB _9 ; Exertional SOB [y]; Orthopnea _10 ; Pedal Edema _11 ; Palpitations _12 ; Syncope [y]; Presyncope _13 ; Paroxysmal nocturnal dyspnea_14   Pulmonary: Cough _15 ; Wheezing_16 ; Hemoptysis_17 ; Sputum _18 ; Snoring _19   GI: Vomiting_20 ; Dysphagia_21 ; Melena_22 ; Hematochezia _23 ; Heartburn_24 ; Abdominal pain _25 ; Constipation _26 ; Diarrhea _27 ; BRBPR _28   GU: Hematuria_29 ; Dysuria _30 ; Nocturia_31   Vascular: Pain in legs with walking _32 ; Pain in feet with lying flat _33 ; Non-healing sores _34 ; Stroke _35 ; TIA _36 ; Slurred speech _37 ;  Neuro: Headaches_38 ; Vertigo_39 ; Seizures_40 ; Paresthesias_41 ;Blurred vision _42 ; Diplopia _43 ; Vision changes _44   Ortho/Skin: Arthritis [y]; Joint pain [y]; Muscle pain _45 ; Joint swelling _46 ; Back Pain _47 ; Rash _48   Psych: Depression_49 ; Anxiety_50   Heme: Bleeding problems _51 ; Clotting disorders _52 ; Anemia _53   Endocrine: Diabetes _54 ; Thyroid dysfunction_55    Home Medications Prior to Admission medications   Medication Sig Start Date End Date Taking? Authorizing Provider  albuterol (PROVENTIL HFA;VENTOLIN HFA) 108 (90 BASE) MCG/ACT inhaler Inhale 2 puffs into the lungs every 6 (six)  hours as needed for wheezing or shortness of breath.   Yes Historical Provider, MD  doxepin (SINEQUAN) 10 MG capsule TAKE 1 TO 2 CAPSULES AT BEDTIME Patient taking differently: Take 20 mg by mouth at bedtime as needed for sleep 10/13/16  Yes Mosie Lukes, MD  Fluticasone Propionate 0.05 % LOTN Apply daily to affected area Patient taking differently: Apply 1 application topically daily. Apply daily to affected area 05/15/15  Yes Mosie Lukes, MD  furosemide (LASIX) 40 MG tablet Take 1 tablet (40 mg total) by mouth daily. Take as directed on Monday and Friday Patient taking differently: Take 40 mg by mouth every Monday, Wednesday, and Friday.  06/06/16  Yes Orson Eva, MD  levothyroxine (SYNTHROID,  LEVOTHROID) 25 MCG tablet TAKE 1 TABLET DAILY 07/13/16  Yes Mosie Lukes, MD  loperamide (ANTI-DIARRHEAL) 2 MG capsule Take 4 mg by mouth as needed for diarrhea or loose stools (up to 8 capsules per day).    Yes Historical Provider, MD  losartan (COZAAR) 50 MG tablet Take 50 mg by mouth daily.   Yes Historical Provider, MD  Macitentan (OPSUMIT) 10 MG TABS Take 10 mg by mouth daily. 03/23/14  Yes Shaune Pascal Bensimhon, MD  mometasone-formoterol (DULERA) 100-5 MCG/ACT AERO Inhale 2 puffs into the lungs daily.    Yes Historical Provider, MD  Multiple Vitamin (MULTIVITAMIN WITH MINERALS) TABS tablet Take 1 tablet by mouth daily. 12/15/16  Yes Mosie Lukes, MD  omeprazole (PRILOSEC) 40 MG capsule TAKE 1 CAPSULE DAILY 08/31/16  Yes Mosie Lukes, MD  potassium chloride (K-DUR,KLOR-CON) 10 MEQ tablet Take 10 mEq by mouth daily.   Yes Historical Provider, MD  Selexipag (UPTRAVI) 1000 MCG TABS Take 1,000 mg by mouth every morning. 11/13/15  Yes Shaune Pascal Bensimhon, MD  Selexipag (UPTRAVI) 800 MCG TABS Take 1 tablet (800 mcg total) by mouth every evening. 11/13/15  Yes Jolaine Artist, MD  spironolactone (ALDACTONE) 50 MG tablet TAKE 1 TABLET DAILY 12/01/16  Yes Mosie Lukes, MD  SYMAX-SR 0.375 MG 12 hr tablet TAKE 1 TABLET TWICE A DAY 08/31/16  Yes Mosie Lukes, MD  tadalafil, PAH, (ADCIRCA) 20 MG tablet TAKE 2 TABLETS (40 MG TOTAL) DAILY 12/09/16  Yes Jolaine Artist, MD  HYDROcodone-acetaminophen (NORCO) 5-325 MG tablet Take 1 tablet by mouth every 6 (six) hours as needed for moderate pain. Patient not taking: Reported on 12/15/2016 07/22/16   Mackie Pai, PA-C  methocarbamol (ROBAXIN) 750 MG tablet Take 1 tablet (750 mg total) by mouth 3 (three) times daily. Patient not taking: Reported on 12/31/2016 07/20/16   Dron Tanna Furry, MD    Past Medical History: Past Medical History:  Diagnosis Date  . Alcohol dependence ([place]) 03/22/2011   In remission   . Alcohol dependence in remission ([place])  03/22/2011   In remission   . Alcoholic cirrhosis of liver with ascites ([place]) 2014  . Dermatitis 06/10/2015  . Diarrhea 09/04/2013  . GERD (gastroesophageal reflux disease)   . Gout   . Heart murmur   . Hx of colonic polyps   . Hypertension   . Hypopotassemia   . Otalgia 11/28/2013  . Other chronic pulmonary heart diseases   . Preventative health care 06/29/2016  . Red eye 03/03/2016   right  . Renal insufficiency 07/18/2013  . Seizures ([place])   . Sleep apnea    does not wear CPAP  . Thrombocytopenia ([place]) 06/29/2016  . Unspecified hypothyroidism 01/25/2013  . Unspecified pleural effusion  Past Surgical History: Past Surgical History:  Procedure Laterality Date  . CARDIAC CATHETERIZATION N/A 12/21/2015   Procedure: Right Heart Cath;  Surgeon: Jolaine Artist, MD;  Location: [place] [facility];  Service: Cardiovascular;  Laterality: N/A;  . CARDIAC CATHETERIZATION N/A 04/15/2016   Procedure: Right/Left Heart Cath and Coronary Angiography;  Surgeon: Jolaine Artist, MD;  Location: [facility];  Service: Cardiovascular;  Laterality: N/A;  . COLONOSCOPY N/A 12/08/2013   Procedure: COLONOSCOPY;  Surgeon: Milus Banister, MD;  Location: [facility];  Service: Endoscopy;  Laterality: N/A;  . ESOPHAGOGASTRODUODENOSCOPY (EGD) WITH PROPOFOL N/A 03/27/2016   Procedure: ESOPHAGOGASTRODUODENOSCOPY (EGD) WITH PROPOFOL;  Surgeon: Milus Banister, MD;  Location: [facility];  Service: Endoscopy;  Laterality: N/A;  . RIGHT HEART CATHETERIZATION Right 06/06/2014   Procedure: RIGHT HEART CATH;  Surgeon: Jolaine Artist, MD;  Location: [facility];  Service: Cardiovascular;  Laterality: Right;  . UVULOPALATOPHARYNGOPLASTY  1999   Family History:  Family History  Problem Relation Age of Onset  . Heart disease Mother   . Heart attack Mother   . Hypertension Mother   . Prostate cancer Father   . Colon cancer Neg Hx     Social History: Social History   Social History  . Marital  status: Divorced    Spouse name: N/A  . Number of children: 3  . Years of education: N/A   Occupational History  . Retired     Social research officer, government   Social History Main Topics  . Smoking status: Former Smoker    Packs/day: 2.00    Years: 5.00    Types: Cigarettes    Quit date: 09/22/1979  . Smokeless tobacco: Never Used  . Alcohol use 3.6 oz/week    6 Shots of liquor per week     Comment: 06/04/2016 "2 vodka & tonics on MWF"  . Drug use: No  . Sexual activity: Not Currently   Other Topics Concern  . None   Social History Narrative   0 caffeine drinks daily     Allergies:  Allergies  Allergen Reactions  . Aspirin Other (See Comments)    hypertension    Objective:    Vital Signs:   Temp:  [98.4 F (36.9 C)] 98.4 F (36.9 C) (02/28 0435) Pulse Rate:  [82-98] 98 (02/28 0700) Resp:  [16-24] 21 (02/28 0700) BP: (140-164)/(88-100) 147/95 (02/28 0700) SpO2:  [96 %-99 %] 97 % (02/28 0700) Weight:  [199 lb 3.2 oz (90.4 kg)] 199 lb 3.2 oz (90.4 kg) (02/28 0700)   Filed Weights   12/31/16 0700  Weight: 199 lb 3.2 oz (90.4 kg)    Physical Exam: General:  NAD. Facial flushing.  HEENT: Normal Neck: supple. JVD 9-10 cm. Carotids 2+ bilat; no bruits. No lymphadenopathy or thyromegaly appreciated. Cor: PMI nondisplaced. RRR. 2/6 TR. Loud P2 + RV lift.  Lungs: Mildly diminished throughout, Scattered expiratory wheeze.  Abdomen: soft, NT, ND, no HSM. No bruits or masses. +BS  Extremities: no cyanosis, clubbing, rash, Trace ankle edema.  Neuro: alert & oriented x 3, cranial nerves grossly intact. moves all 4 extremities w/o difficulty. Affect pleasant. No focal neurological symptoms.   Telemetry: Personally reviewed, NSR  Labs: Basic Metabolic Panel:  Recent Labs Lab 12/31/16 0257  NA 139  K 4.0  CL 108  CO2 19*  GLUCOSE 86  BUN 21*  CREATININE 1.45*  CALCIUM 9.7    Liver Function Tests: No results for input(s): AST, ALT, ALKPHOS, BILITOT, PROT,  ALBUMIN in the last  168 hours. No results for input(s): LIPASE, AMYLASE in the last 168 hours. No results for input(s): AMMONIA in the last 168 hours.  CBC:  Recent Labs Lab 12/31/16 0257  WBC 2.0*  HGB 12.5*  HCT 35.3*  MCV 103.8*  PLT 36*    Cardiac Enzymes:  Recent Labs Lab 12/31/16 0249  TROPONINI 0.03*    BNP: BNP (last 3 results)  Recent Labs  06/04/16 0820 07/19/16 0223 12/31/16 0257  BNP 171.9* 665.9* 589.6*    ProBNP (last 3 results)  Recent Labs  04/28/16 1400 07/22/16 1317  PROBNP 383.0* 327.0*     CBG: No results for input(s): GLUCAP in the last 168 hours.  Coagulation Studies: No results for input(s): LABPROT, INR in the last 72 hours.  Other results: EKG: Pending at time of note.   Imaging: Dg Chest 2 View  Result Date: 12/31/2016 CLINICAL DATA:  Acute onset of syncope and fall. Concern for chest injury. Shortness of breath and tachycardia. Initial encounter. EXAM: CHEST  2 VIEW COMPARISON:  Chest radiograph performed 12/02/2016 FINDINGS: The lungs are well-aerated. Vascular congestion is noted. Mildly increased interstitial markings may reflect transient interstitial edema. There is no evidence of pleural effusion or pneumothorax. The heart is enlarged. No acute osseous abnormalities are seen. Chronic bilateral rib deformities are noted. IMPRESSION: Vascular congestion and cardiomegaly. Mildly increased interstitial markings may reflect transient interstitial edema. Electronically Signed   By: Garald Balding M.D.   On: 12/31/2016 03:29      Assessment/Plan   1. Syncope - Without preceding symptoms. Pt has had similar presentation before.  Nothing seen on 30 day event monitor in October of last year.  - LVEF normal but RV severely dilated and reduced on Echo 04/2016.   ? Worsening Pulmonary HTN. - Will repeat Echo - Will consult EP to consider LINQ monitor 2. PAH - Suspected portopulmonary HTN - Persistent NYHA III symptoms.  - Remains on triple  therapy. Will continue his Macitentan 10 daily, adcirca 40 mg daily, selexipag 1000/800 - Follow closely for decompensation and consideration of IV therapies - Needs to stop ETOH completely. - Will repeat Echo 3. CKD III - Follow with labs 4. Cirrhosis - Likely combo of RV failure and ETOH - Needs to stop drinking completely.  Sees Dr Ardis Hughs, GI 5. HTN - Hasn't had meds this am. Will follow need for titration 6. OSA - Mild OSA with AHI 12 and desats down to 82% but intolerant to CPAP 7. Pancytopenia - Sees heme. Felt likely due to splenic sequestration.   Will admit for further evaluation and possible LINQ monitor with EP consult.   ? Need for RHC to reassess pressures for possible worsening PAH/RV failure.   Length of Stay: 0  Annamaria Helling 12/31/2016, 7:18 AM  Advanced Heart Failure Team Pager [phone] (M-F; 7a - 4p)  Please contact [facility] for night-coverage after hours (4p -7a ) and weekends on [email or web address]  Patient seen with PA, agree with the above note.   1. Syncope: Standing in kitchen cooking, no prodrome. Has happened before, last in 9/17.  This is his 4th episode, each separated by a number of months.  He wore an event monitor after 9/17 syncopal episode for 1 month, no significant arrhythmias. He was feeling fine prior, no chest pain, no palpitations, no dyspnea.  He has been at baseline symptomatically, dyspnea with heavy exertion.  Possible etiologies are arrhythmic, vasovagal, related to pulmonary HTN, or ETOH.  ETOH level was elevated but he did not appear intoxicated and is not orthostatic.  He has been stable from the standpoint of [place].  I suspect this was either vasovagal (possibly most likely) or arrhythmic.  - Monitor overnight on telemetry.  - Will repeat echocardiogram to reassess PA pressure and RV function.  - Would favor LINQ monitor, will ask EP to see.  Discussed with patient.  2. Pulmonary hypertension with RV failure: Suspect  portopulmonary hypertension.  Symptomatically, he has been stable, NYHA class II.  Takes Lasix 3 days a week and has not missed his PH meds.  On exam, he does have some signs of RV failure with JVD and mild abdominal distention.  His BNP is elevated at 590.   - Continue macitentan, selexipeg, and Adcirca. - Would increase Lasix dosing to 40 mg daily.  - Obtain echo as above.  3. CKD: Stage III.  Stable.  4. Cirrhosis: Combination of ETOH and RV failure.  Needs to abstain completely from ETOH.  5. HTN: BP high but has not had his am meds yet, will write for his meds and follow BP on therapy.  6. Pancytopenia: Chronic, followed by hematology.  Likely due to splenic sequestration in the setting of cirrhosis.   Loralie Champagne 12/31/2016 9:28 AM

## 2016-12-31 NOTE — ED Triage Notes (Signed)
Per EMS at home 0100 syncope episode in kitchen, fell backwards hit back of head on cabinets. Pt AO x 4. No hematomas. C/o SOB, slightly tachy upon arrival. H/s of syncope. 166/100, 95, bg 105, r 20. 20G L ac

## 2017-01-01 ENCOUNTER — Inpatient Hospital Stay (HOSPITAL_COMMUNITY): Payer: Medicare Other

## 2017-01-01 ENCOUNTER — Encounter (HOSPITAL_COMMUNITY): Payer: Self-pay | Admitting: Internal Medicine

## 2017-01-01 ENCOUNTER — Encounter (HOSPITAL_COMMUNITY)
Admission: EM | Disposition: A | Discharge status: Home / Self Care | Payer: Self-pay | Source: Home / Self Care | Attending: Internal Medicine

## 2017-01-01 DIAGNOSIS — I270 Primary pulmonary hypertension: Secondary | ICD-10-CM

## 2017-01-01 DIAGNOSIS — I2721 Secondary pulmonary arterial hypertension: Secondary | ICD-10-CM

## 2017-01-01 HISTORY — PX: LOOP RECORDER INSERTION: EP1214

## 2017-01-01 LAB — CBC WITH DIFFERENTIAL/PLATELET
Basophils Absolute: 0 10*3/uL (ref 0.0–0.1)
Basophils Relative: 0 %
Eosinophils Absolute: 0 10*3/uL (ref 0.0–0.7)
Eosinophils Relative: 2 %
HCT: 35.8 % — ABNORMAL LOW (ref 39.0–52.0)
HEMOGLOBIN: 12.3 g/dL — AB (ref 13.0–17.0)
LYMPHS ABS: 0.7 10*3/uL (ref 0.7–4.0)
LYMPHS PCT: 34 %
MCH: 35.5 pg — AB (ref 26.0–34.0)
MCHC: 34.4 g/dL (ref 30.0–36.0)
MCV: 103.5 fL — AB (ref 78.0–100.0)
Monocytes Absolute: 0.3 10*3/uL (ref 0.1–1.0)
Monocytes Relative: 13 %
NEUTROS ABS: 1 10*3/uL — AB (ref 1.7–7.7)
NEUTROS PCT: 50 %
Platelets: 40 10*3/uL — ABNORMAL LOW (ref 150–400)
RBC: 3.46 MIL/uL — AB (ref 4.22–5.81)
RDW: 14 % (ref 11.5–15.5)
WBC: 2 10*3/uL — AB (ref 4.0–10.5)

## 2017-01-01 LAB — BASIC METABOLIC PANEL
Anion gap: 5 (ref 5–15)
BUN: 17 mg/dL (ref 6–20)
CHLORIDE: 108 mmol/L (ref 101–111)
CO2: 24 mmol/L (ref 22–32)
Calcium: 9.2 mg/dL (ref 8.9–10.3)
Creatinine, Ser: 1.44 mg/dL — ABNORMAL HIGH (ref 0.61–1.24)
GFR calc non Af Amer: 49 mL/min — ABNORMAL LOW (ref >60)
GFR, EST AFRICAN AMERICAN: 57 mL/min — AB (ref >60)
Glucose, Bld: 93 mg/dL (ref 65–99)
POTASSIUM: 4.1 mmol/L (ref 3.5–5.1)
SODIUM: 137 mmol/L (ref 135–145)

## 2017-01-01 LAB — ECHOCARDIOGRAM COMPLETE
Height: 72 in
Weight: 3172.8 oz

## 2017-01-01 SURGERY — LOOP RECORDER INSERTION

## 2017-01-01 MED ORDER — LIDOCAINE HCL (PF) 1 % IJ SOLN
INTRAMUSCULAR | Status: DC | PRN
  Administered 2017-01-01: 10 mL via SUBCUTANEOUS

## 2017-01-01 MED ORDER — MACITENTAN 10 MG PO TABS
10.0000 mg | ORAL_TABLET | Freq: Every day | ORAL | Status: DC
  Filled 2017-01-01: qty 1

## 2017-01-01 MED ORDER — SELEXIPAG 800 MCG PO TABS
800.0000 ug | ORAL_TABLET | Freq: Every evening | ORAL | Status: DC
  Administered 2017-01-01: 800 ug via ORAL
  Filled 2017-01-01: qty 1

## 2017-01-01 MED ORDER — ISOPROTERENOL HCL 0.2 MG/ML IJ SOLN
INTRAMUSCULAR | Status: AC
  Filled 2017-01-01: qty 5

## 2017-01-01 MED ORDER — MACITENTAN 10 MG PO TABS
10.0000 mg | ORAL_TABLET | Freq: Every day | ORAL | Status: DC
  Administered 2017-01-01 – 2017-01-02 (×2): 10 mg via ORAL
  Filled 2017-01-01 (×2): qty 1

## 2017-01-01 MED ORDER — HEPARIN SODIUM (PORCINE) 1000 UNIT/ML IJ SOLN
INTRAMUSCULAR | Status: AC
  Filled 2017-01-01: qty 1

## 2017-01-01 MED ORDER — SELEXIPAG 1000 MCG PO TABS
1000.0000 ug | ORAL_TABLET | Freq: Every morning | ORAL | Status: DC
  Administered 2017-01-02: 1000 ug via ORAL
  Filled 2017-01-01 (×2): qty 1

## 2017-01-01 MED ORDER — LIDOCAINE HCL (PF) 1 % IJ SOLN
INTRAMUSCULAR | Status: AC
  Filled 2017-01-01: qty 30

## 2017-01-01 SURGICAL SUPPLY — 2 items
LOOP REVEAL LINQSYS (Prosthesis & Implant Heart) ×3 IMPLANT
PACK LOOP INSERTION (CUSTOM PROCEDURE TRAY) ×3 IMPLANT

## 2017-01-01 NOTE — Consult Note (Signed)
ELECTROPHYSIOLOGY CONSULT NOTE    Patient ID: Darren Mueller MRN: [id number], DOB/AGE: Nov 19, 1950 66 y.o.  Admit date: 12/31/2016 Date of Consult: 01/01/2017  Primary Physician: Penni Homans, MD Primary Cardiologist: Haroldine Laws Requesting MD: Aundra Dubin   Reason for Consultation: recurrent syncope  HPI:  Darren Mueller is a 66 y.o. male with a past medical history significant for COPD, pulmonary hypertension, RV failure, ETOH abuse in the past, and recurrent syncope. His syncopal episodes always occur while standing. They occur without prodrome and he has no residual symptoms after the episodes. He has previously worn an event monitor to try to find cause but did not have syncope during the time the event monitor was on.  He was in his kitchen the day of admission and standing when he had another syncopal spell without warning.    He currently denies chest pain, shortness of breath, LE edema, recent fevers, chills, nausea or vomiting.   Past Medical History:  Diagnosis Date  . Alcohol dependence ([place]) 03/22/2011   In remission   . Alcohol dependence in remission ([place]) 03/22/2011   In remission   . Alcoholic cirrhosis of liver with ascites ([place]) 2014  . Dermatitis 06/10/2015  . Diarrhea 09/04/2013  . GERD (gastroesophageal reflux disease)   . Gout   . Heart murmur   . Hx of colonic polyps   . Hypertension   . Hypopotassemia   . Otalgia 11/28/2013  . Other chronic pulmonary heart diseases   . Preventative health care 06/29/2016  . Red eye 03/03/2016   right  . Renal insufficiency 07/18/2013  . Seizures ([place])   . Sleep apnea    does not wear CPAP  . Thrombocytopenia ([place]) 06/29/2016  . Unspecified hypothyroidism 01/25/2013  . Unspecified pleural effusion      Surgical History:  Past Surgical History:  Procedure Laterality Date  . CARDIAC CATHETERIZATION N/A 12/21/2015   Procedure: Right Heart Cath;  Surgeon: Jolaine Artist, MD;  Location: [place] [facility];  Service:  Cardiovascular;  Laterality: N/A;  . CARDIAC CATHETERIZATION N/A 04/15/2016   Procedure: Right/Left Heart Cath and Coronary Angiography;  Surgeon: Jolaine Artist, MD;  Location: [place] [facility];  Service: Cardiovascular;  Laterality: N/A;  . COLONOSCOPY N/A 12/08/2013   Procedure: COLONOSCOPY;  Surgeon: Milus Banister, MD;  Location: [facility];  Service: Endoscopy;  Laterality: N/A;  . ESOPHAGOGASTRODUODENOSCOPY (EGD) WITH PROPOFOL N/A 03/27/2016   Procedure: ESOPHAGOGASTRODUODENOSCOPY (EGD) WITH PROPOFOL;  Surgeon: Milus Banister, MD;  Location: [place];  Service: Endoscopy;  Laterality: N/A;  . RIGHT HEART CATHETERIZATION Right 06/06/2014   Procedure: RIGHT HEART CATH;  Surgeon: Jolaine Artist, MD;  Location: [facility];  Service: Cardiovascular;  Laterality: Right;  . UVULOPALATOPHARYNGOPLASTY  1999     Prescriptions Prior to Admission  Medication Sig Dispense Refill Last Dose  . albuterol (PROVENTIL HFA;VENTOLIN HFA) 108 (90 BASE) MCG/ACT inhaler Inhale 2 puffs into the lungs every 6 (six) hours as needed for wheezing or shortness of breath.   12/30/2016 at Unknown time  . doxepin (SINEQUAN) 10 MG capsule TAKE 1 TO 2 CAPSULES AT BEDTIME (Patient taking differently: Take 20 mg by mouth at bedtime as needed for sleep) 180 capsule 1 Past Week at Unknown time  . Fluticasone Propionate 0.05 % LOTN Apply daily to affected area (Patient taking differently: Apply 1 application topically daily. Apply daily to affected area) 3 Bottle 3 12/30/2016 at Unknown time  . furosemide (LASIX) 40 MG tablet Take 1 tablet (40  mg total) by mouth daily. Take as directed on Monday and Friday (Patient taking differently: Take 40 mg by mouth every Monday, Wednesday, and Friday. ) 30 tablet 3 Past Week at Unknown time  . levothyroxine (SYNTHROID, LEVOTHROID) 25 MCG tablet TAKE 1 TABLET DAILY 90 tablet 1 12/30/2016 at Unknown time  . loperamide (ANTI-DIARRHEAL) 2 MG capsule Take 4 mg by mouth as needed for  diarrhea or loose stools (up to 8 capsules per day).    12/30/2016 at Unknown time  . losartan (COZAAR) 50 MG tablet Take 50 mg by mouth daily.   12/30/2016 at Unknown time  . Macitentan (OPSUMIT) 10 MG TABS Take 10 mg by mouth daily. 90 tablet 3 12/30/2016 at Unknown time  . mometasone-formoterol (DULERA) 100-5 MCG/ACT AERO Inhale 2 puffs into the lungs daily.    12/30/2016 at Unknown time  . Multiple Vitamin (MULTIVITAMIN WITH MINERALS) TABS tablet Take 1 tablet by mouth daily.   12/30/2016 at Unknown time  . omeprazole (PRILOSEC) 40 MG capsule TAKE 1 CAPSULE DAILY 90 capsule 2 12/30/2016 at Unknown time  . potassium chloride (K-DUR,KLOR-CON) 10 MEQ tablet Take 10 mEq by mouth daily.   12/30/2016 at Unknown time  . Selexipag (UPTRAVI) 1000 MCG TABS Take 1,000 mg by mouth every morning. 90 tablet 2 12/30/2016 at Unknown time  . Selexipag (UPTRAVI) 800 MCG TABS Take 1 tablet (800 mcg total) by mouth every evening. 90 tablet 2 Past Week at Unknown time  . spironolactone (ALDACTONE) 50 MG tablet TAKE 1 TABLET DAILY 90 tablet 0 12/30/2016 at Unknown time  . SYMAX-SR 0.375 MG 12 hr tablet TAKE 1 TABLET TWICE A DAY 180 tablet 1 12/30/2016 at Unknown time  . tadalafil, PAH, (ADCIRCA) 20 MG tablet TAKE 2 TABLETS (40 MG TOTAL) DAILY 180 tablet 2 12/30/2016 at Unknown time  . HYDROcodone-acetaminophen (NORCO) 5-325 MG tablet Take 1 tablet by mouth every 6 (six) hours as needed for moderate pain. (Patient not taking: Reported on 12/15/2016) 20 tablet 0 Completed Course at Unknown time  . methocarbamol (ROBAXIN) 750 MG tablet Take 1 tablet (750 mg total) by mouth 3 (three) times daily. (Patient not taking: Reported on 12/31/2016) 30 tablet 0 Completed Course at Unknown time    Inpatient Medications:  . Fluticasone Propionate  1 application Topical Daily  . [START ON 01/02/2017] furosemide  40 mg Oral Once per day on Mon Fri  . hyoscyamine  0.375 mg Oral BID  . levothyroxine  25 mcg Oral QAC breakfast  . losartan  50 mg  Oral Daily  . Macitentan  10 mg Oral Daily  . mometasone-formoterol  2 puff Inhalation Daily  . multivitamin with minerals  1 tablet Oral Daily  . pantoprazole  40 mg Oral Daily  . potassium chloride  10 mEq Oral Daily  . Selexipag  1,000 mcg Oral q morning - 10a  . Selexipag  800 mcg Oral QPM  . sodium chloride flush  3 mL Intravenous Q12H  . spironolactone  50 mg Oral Daily  . tadalafil (PAH)  40 mg Oral Daily    Allergies:  Allergies  Allergen Reactions  . Aspirin Other (See Comments)    hypertension    Social History   Social History  . Marital status: Divorced    Spouse name: N/A  . Number of children: 3  . Years of education: N/A   Occupational History  . Retired     Social research officer, government   Social History Main Topics  . Smoking status: Former  Smoker    Packs/day: 2.00    Years: 5.00    Types: Cigarettes    Quit date: 09/22/1979  . Smokeless tobacco: Never Used  . Alcohol use 3.6 oz/week    6 Shots of liquor per week     Comment: 12/31/2016 " "2 vodka & tonics on MWF"  . Drug use: No  . Sexual activity: Not Currently   Other Topics Concern  . Not on file   Social History Narrative   0 caffeine drinks daily      Family History  Problem Relation Age of Onset  . Heart disease Mother   . Heart attack Mother   . Hypertension Mother   . Prostate cancer Father   . Colon cancer Neg Hx      Review of Systems: All other systems reviewed and are otherwise negative except as noted above.  Physical Exam: Vitals:   12/31/16 1353 12/31/16 2121 01/01/17 0048 01/01/17 0548  BP: (!) 158/100 (!) 162/85  (!) 147/96  Pulse: 89 72  68  Resp: _0 Temp: 98 F (36.7 C) 98 F (36.7 C)  97.6 F (36.4 C)  TempSrc: Oral Oral  Oral  SpO2: 98% 100%  97%  Weight: 198 lb 13.7 oz (90.2 kg)  198 lb 4.8 oz (89.9 kg)   Height: 6' (1.829 m)       GEN- The patient is well appearing, alert and oriented x 3 today.   HEENT: normocephalic, atraumatic; sclera clear, conjunctiva  pink; hearing intact; oropharynx clear; neck supple  Lungs- Clear to ausculation bilaterally, normal work of breathing.  No wheezes, rales, rhonchi Heart- Regular rate and rhythm, no murmurs, rubs or gallops, PMI not laterally displaced GI- soft, non-tender, non-distended, bowel sounds present, no hepatosplenomegaly Extremities- no clubbing, cyanosis, or edema; DP/PT/radial pulses 2+ bilaterally MS- no significant deformity or atrophy Skin- warm and dry, no rash or lesion Psych- euthymic mood, full affect Neuro- strength and sensation are intact  Labs:   Lab Results  Component Value Date   WBC 2.0 (L) 01/01/2017   HGB 12.3 (L) 01/01/2017   HCT 35.8 (L) 01/01/2017   MCV 103.5 (H) 01/01/2017   PLT 40 (L) 01/01/2017    Recent Labs Lab 01/01/17 0240  NA 137  K 4.1  CL 108  CO2 24  BUN 17  CREATININE 1.44*  CALCIUM 9.2  GLUCOSE 93      Radiology/Studies: Dg Chest 2 View  Result Date: 12/31/2016 CLINICAL DATA:  Acute onset of syncope and fall. Concern for chest injury. Shortness of breath and tachycardia. Initial encounter. EXAM: CHEST  2 VIEW COMPARISON:  Chest radiograph performed 12/02/2016 FINDINGS: The lungs are well-aerated. Vascular congestion is noted. Mildly increased interstitial markings may reflect transient interstitial edema. There is no evidence of pleural effusion or pneumothorax. The heart is enlarged. No acute osseous abnormalities are seen. Chronic bilateral rib deformities are noted. IMPRESSION: Vascular congestion and cardiomegaly. Mildly increased interstitial markings may reflect transient interstitial edema. Electronically Signed   By: Garald Balding M.D.   On: 12/31/2016 03:29   Dg Chest 2 View  Result Date: 12/02/2016 CLINICAL DATA:  Cough, dyspnea, fever, and left anterior chest pain for the past 7 days. History of right heart failure due to pulmonary hypertension. EXAM: CHEST  2 VIEW COMPARISON:  PA and lateral chest x-ray of July 22, 2016 FINDINGS:  The lungs are well-expanded. There is no focal infiltrate. The cardiac silhouette is enlarged. The pulmonary vascularity is not  engorged. There is calcification in the wall of the aortic arch. The mediastinum is normal in width. There is no pleural effusion. There are healing fractures of the lateral aspects of the right fourth, fifth, and sixth ribs. IMPRESSION: Cardiomegaly without evidence of pulmonary edema. No acute pneumonia. Underlying chronic bronchitic changes. Healing fractures of the lateral aspects of the right fourth through sixth ribs. Thoracic aortic atherosclerosis. Electronically Signed   By: David  Martinique M.D.   On: 12/02/2016 13:12    EKG: sinus rhythm  TELEMETRY: sinus rhythm  Assessment/Plan: 1.  Recurrent syncope There is concern for arrhythmic cause vs RV failure. We have discussed treatment options.  As he has recurrent episodes, it is reasonable to proceed with ILR implant. Risks, benefits reviewed with patient who wishes to proceed No driving x6 months  2.  PAH Management per HF team  3.  HTN Stable No change required today  Signed, Chanetta Marshall, NP 01/01/2017 9:21 AM   I have seen, examined the patient, and reviewed the above assessment and plan.  On exam, RRR.   Changes to above are made where necessary.  Pt with recurrent unexplained syncope.   I would advise ILR monitor placement at this time.  Risks and benefits discussed with the patient who wishes to proceed.  Co Sign: Thompson Grayer, MD 01/01/2017 10:40 AM

## 2017-01-01 NOTE — Progress Notes (Signed)
Advanced Heart Failure Rounding Note  Primary Cardiologist: Dr. Haroldine Laws   Subjective:    Admitted 12/31/16 after syncope.  Feeling great this am. No dyspnea or dizziness.  No VT on monitor. Is having occasional PVCs including trigeminy.   Willing to go through with [facility] monitor. Understands it may be his RV failure/PAH causing syncope.   Pt have someone bring PAH meds from home.   Echo this am pending.   Objective:   Weight Range: 198 lb 4.8 oz (89.9 kg) Body mass index is 26.89 kg/m.   Vital Signs:   Temp:  [97.6 F (36.4 C)-98 F (36.7 C)] 97.6 F (36.4 C) (03/01 0548) Pulse Rate:  [68-89] 68 (03/01 0548) Resp:  [18-20] 18 (03/01 0548) BP: (142-162)/(85-109) 147/96 (03/01 0548) SpO2:  [97 %-100 %] 97 % (03/01 0548) Weight:  [198 lb 4.8 oz (89.9 kg)-198 lb 13.7 oz (90.2 kg)] 198 lb 4.8 oz (89.9 kg) (03/01 0048) Last BM Date: 12/31/16  Weight change: Filed Weights   12/31/16 0700 12/31/16 1353 01/01/17 0048  Weight: 199 lb 3.2 oz (90.4 kg) 198 lb 13.7 oz (90.2 kg) 198 lb 4.8 oz (89.9 kg)    Intake/Output:   Intake/Output Summary (Last 24 hours) at 01/01/17 1026 Last data filed at 01/01/17 0800  Gross per 24 hour  Intake              723 ml  Output                0 ml  Net              723 ml     Physical Exam: General:  NAD. +facial flushing.   HEENT: Normal Neck: supple. JVD 8-9 Carotids 2+ bilat; no bruits. No lymphadenopathy or thyromegaly appreciated. Cor: PMI nondisplaced. RRR. 2/6 TR. Loud P2 + RV lift.  Lungs: Mildly diminished basilar sounds Abdomen: soft, NT, ND, no HSM. No bruits or masses. +BS  Extremities: no cyanosis, clubbing, rash, trace ankle edema at most.   Neuro: alert & oriented x 3, cranial nerves grossly intact. moves all 4 extremities w/o difficulty. Affect pleasant.   Telemetry: Personally reviewed, NSR 60-70s with PVCs including trigeminy  Labs: CBC  Recent Labs  12/31/16 0257 01/01/17 0240  WBC 2.0* 2.0*  NEUTROABS   --  1.0*  HGB 12.5* 12.3*  HCT 35.3* 35.8*  MCV 103.8* 103.5*  PLT 36* 40*   Basic Metabolic Panel  Recent Labs  12/31/16 0257 12/31/16 0800 01/01/17 0240  NA 139  --  137  K 4.0  --  4.1  CL 108  --  108  CO2 19*  --  24  GLUCOSE 86  --  93  BUN 21*  --  17  CREATININE 1.45*  --  1.44*  CALCIUM 9.7  --  9.2  MG  --  1.5*  --    Liver Function Tests No results for input(s): AST, ALT, ALKPHOS, BILITOT, PROT, ALBUMIN in the last 72 hours. No results for input(s): LIPASE, AMYLASE in the last 72 hours. Cardiac Enzymes  Recent Labs  12/31/16 0249  TROPONINI 0.03*    BNP: BNP (last 3 results)  Recent Labs  06/04/16 0820 07/19/16 0223 12/31/16 0257  BNP 171.9* 665.9* 589.6*    ProBNP (last 3 results)  Recent Labs  04/28/16 1400 07/22/16 1317  PROBNP 383.0* 327.0*     D-Dimer No results for input(s): DDIMER in the last 72 hours. Hemoglobin A1C No results for  input(s): HGBA1C in the last 72 hours. Fasting Lipid Panel No results for input(s): CHOL, HDL, LDLCALC, TRIG, CHOLHDL, LDLDIRECT in the last 72 hours. Thyroid Function Tests No results for input(s): TSH, T4TOTAL, T3FREE, THYROIDAB in the last 72 hours.  Invalid input(s): FREET3  Other results:     Imaging/Studies:   No results found.    Medications:     Scheduled Medications: . Fluticasone Propionate  1 application Topical Daily  . [START ON 01/02/2017] furosemide  40 mg Oral Once per day on Mon Fri  . hyoscyamine  0.375 mg Oral BID  . levothyroxine  25 mcg Oral QAC breakfast  . losartan  50 mg Oral Daily  . Macitentan  10 mg Oral Daily  . mometasone-formoterol  2 puff Inhalation Daily  . multivitamin with minerals  1 tablet Oral Daily  . pantoprazole  40 mg Oral Daily  . potassium chloride  10 mEq Oral Daily  . Selexipag  1,000 mcg Oral q morning - 10a  . Selexipag  800 mcg Oral QPM  . sodium chloride flush  3 mL Intravenous Q12H  . spironolactone  50 mg Oral Daily  .  tadalafil (PAH)  40 mg Oral Daily     Infusions:   PRN Medications:  sodium chloride, acetaminophen, albuterol, doxepin, loperamide, ondansetron (ZOFRAN) IV, sodium chloride flush   Assessment   1. Syncope 2. PAH 3. CKD III 4. Cirrhosis 5. HTN 6. OSA 7. Pancytopenia  Plan    No further syncope. No arrhythmia apart from PVCs + trigeminy. Plan for LINQ monitor this afternoon.   Pt understands syncope may be coming from RV failure/PAH.  Repeat Echo pending to look at PA pressure and RV function.  ? Need for RHC if PA pressure elevated compared to previous.   Continue macitentan, selexipeg, and Adcirca.  Pt to bring from home.  BP elevated but hasn't had his PAH meds yet.    Continue lasix 40 mg daily for now. JVP ~ 8-9.  Possibly home tomorrow vs this afternoon after LINQ placement  Length of Stay: 1   Annamaria Helling  01/01/2017, 10:26 AM  Advanced Heart Failure Team Pager [phone] (M-F; 7a - 4p)  Please contact [facility] for night-coverage after hours (4p -7a ) and weekends on [email or web address]  Patient seen and examined with Oda Kilts, PA-C. We discussed all aspects of the encounter. I agree with the assessment and plan as stated above.   Telemetry reviewed personally. Suspect syncope related to PAH/RV failure and likely not arrhythmia. However, given frequent PVCs and recurrent syncope, I do think LINQ is reasonable.   Volume status looks ok. His family is brining PAH meds from home. I told him to restart today to avoid rebound particularly with selexipag.   Pancytopenia felt to be related to cirrhosis.   Airika Alkhatib,MD 10:53 PM

## 2017-01-01 NOTE — Progress Notes (Signed)
  Echocardiogram 2D Echocardiogram has been performed.  Diamond Nickel 01/01/2017, 9:52 AM

## 2017-01-02 LAB — CBC WITH DIFFERENTIAL/PLATELET
Basophils Absolute: 0 10*3/uL (ref 0.0–0.1)
Basophils Relative: 1 %
Eosinophils Absolute: 0 10*3/uL (ref 0.0–0.7)
Eosinophils Relative: 1 %
HEMATOCRIT: 35.1 % — AB (ref 39.0–52.0)
HEMOGLOBIN: 12.5 g/dL — AB (ref 13.0–17.0)
LYMPHS ABS: 0.8 10*3/uL (ref 0.7–4.0)
Lymphocytes Relative: 37 %
MCH: 37.4 pg — AB (ref 26.0–34.0)
MCHC: 35.6 g/dL (ref 30.0–36.0)
MCV: 105.1 fL — AB (ref 78.0–100.0)
MONO ABS: 0.2 10*3/uL (ref 0.1–1.0)
MONOS PCT: 9 %
NEUTROS ABS: 1.1 10*3/uL — AB (ref 1.7–7.7)
NEUTROS PCT: 53 %
Platelets: 40 10*3/uL — ABNORMAL LOW (ref 150–400)
RBC: 3.34 MIL/uL — ABNORMAL LOW (ref 4.22–5.81)
RDW: 14 % (ref 11.5–15.5)
WBC: 2.2 10*3/uL — ABNORMAL LOW (ref 4.0–10.5)

## 2017-01-02 LAB — BASIC METABOLIC PANEL
ANION GAP: 8 (ref 5–15)
BUN: 18 mg/dL (ref 6–20)
CALCIUM: 9.4 mg/dL (ref 8.9–10.3)
CHLORIDE: 104 mmol/L (ref 101–111)
CO2: 24 mmol/L (ref 22–32)
Creatinine, Ser: 1.46 mg/dL — ABNORMAL HIGH (ref 0.61–1.24)
GFR calc Af Amer: 56 mL/min — ABNORMAL LOW (ref >60)
GFR calc non Af Amer: 48 mL/min — ABNORMAL LOW (ref >60)
GLUCOSE: 95 mg/dL (ref 65–99)
Potassium: 4.1 mmol/L (ref 3.5–5.1)
Sodium: 136 mmol/L (ref 135–145)

## 2017-01-02 NOTE — Discharge Summary (Signed)
Advanced Heart Failure Discharge Note  Discharge Summary   Patient ID: Darren Mueller MRN: [id number], DOB/AGE: 1951-08-16 66 y.o. Admit date: 12/31/2016 D/C date:     01/02/2017   Primary Discharge Diagnoses:  1. Syncope 2. PAH 3. CKD III 4. Cirrhosis 5. HTN 6. OSA 7. Pancytopenia 8. Chronic diastolic HF 9. Chronic RV failure  Hospital Course:   Darren Mueller is a 66 y.o. male with history of COPD, portopulmonary hypertension with RV failure, and ETOH cirrhosis.   Pt presented to [facility] 12/31/16 with syncope while standing in his kitchen.  This is at least his 3rd reported syncope since August. Creatinine stable. PVCs noted on telemetry.  EP consulted and pt agreed to [facility] monitor with recurrent syncope. Pt aware that also possible syncope is due from his significant RV failure 2/2 to his PAH.   Pt underwent LINQ monitor placement 01/01/17 without incident.  Pt felt "out of it" afterward so observed overnight.   Pt stable symptomatically for the duration of admission. Pt felt much better am of 01/02/17 and considered stable for home discharge.   Pt encouraged to stop ETOH completely with + Serum ETOH on admission.  Pt also aware of  recommendation of no driving for 6 months s/p syncope from presumed cardiac origin.   Hospital course also consisted of mild volume overload. Given additional po torsemide, and transitioned back to chronic home dose on discharge.   Echo 01/01/17 LVEF 50-55%, Grade 1 DD, RV severely dilated, RA severely dilated. PA peak pressure 62 mm Hg.  Pt stable for home today with close follow up as below. Pt knows to call with any worsening symptoms or come to ED with any worsening syncope.   Discharge Weight Range: 196 lbs Discharge Vitals: Blood pressure (!) 150/91, pulse 71, temperature 98 F (36.7 C), temperature source Oral, resp. rate 18, height 6' (1.829 m), weight 196 lb 4.8 oz (89 kg), SpO2 97 %.  Labs: Lab Results  Component Value Date   WBC 2.2 (L)  01/02/2017   HGB 12.5 (L) 01/02/2017   HCT 35.1 (L) 01/02/2017   MCV 105.1 (H) 01/02/2017   PLT 40 (L) 01/02/2017     Recent Labs Lab 01/02/17 0248  NA 136  K 4.1  CL 104  CO2 24  BUN 18  CREATININE 1.46*  CALCIUM 9.4  GLUCOSE 95   Lab Results  Component Value Date   CHOL 198 06/20/2016   HDL 62.40 06/20/2016   LDLCALC 77 12/04/2015   TRIG 485.0 (H) 06/20/2016   BNP (last 3 results)  Recent Labs  06/04/16 0820 07/19/16 0223 12/31/16 0257  BNP 171.9* 665.9* 589.6*    ProBNP (last 3 results)  Recent Labs  04/28/16 1400 07/22/16 1317  PROBNP 383.0* 327.0*     Diagnostic Studies/Procedures   No results found.  Discharge Medications   Allergies as of 01/02/2017      Reactions   Aspirin Other (See Comments)   hypertension      Medication List    TAKE these medications   albuterol 108 (90 Base) MCG/ACT inhaler Commonly known as:  PROVENTIL HFA;VENTOLIN HFA Inhale 2 puffs into the lungs every 6 (six) hours as needed for wheezing or shortness of breath.   ANTI-DIARRHEAL 2 MG capsule Generic drug:  loperamide Take 4 mg by mouth as needed for diarrhea or loose stools (up to 8 capsules per day).   doxepin 10 MG capsule Commonly known as:  SINEQUAN TAKE 1 TO 2 CAPSULES AT BEDTIME What  changed:  See the new instructions.   Fluticasone Propionate 0.05 % Lotn Apply daily to affected area What changed:  how much to take  how to take this  when to take this  additional instructions   furosemide 40 MG tablet Commonly known as:  LASIX Take 1 tablet (40 mg total) by mouth daily. Take as directed on Monday and Friday What changed:  when to take this  additional instructions   HYDROcodone-acetaminophen 5-325 MG tablet Commonly known as:  NORCO Take 1 tablet by mouth every 6 (six) hours as needed for moderate pain.   levothyroxine 25 MCG tablet Commonly known as:  SYNTHROID, LEVOTHROID TAKE 1 TABLET DAILY   losartan 50 MG tablet Commonly  known as:  COZAAR Take 50 mg by mouth daily.   Macitentan 10 MG Tabs Commonly known as:  OPSUMIT Take 10 mg by mouth daily.   methocarbamol 750 MG tablet Commonly known as:  ROBAXIN Take 1 tablet (750 mg total) by mouth 3 (three) times daily.   mometasone-formoterol 100-5 MCG/ACT Aero Commonly known as:  DULERA Inhale 2 puffs into the lungs daily.   multivitamin with minerals Tabs tablet Take 1 tablet by mouth daily.   omeprazole 40 MG capsule Commonly known as:  PRILOSEC TAKE 1 CAPSULE DAILY   potassium chloride 10 MEQ tablet Commonly known as:  K-DUR,KLOR-CON Take 10 mEq by mouth daily.   Selexipag 800 MCG Tabs Commonly known as:  UPTRAVI Take 1 tablet (800 mcg total) by mouth every evening.   Selexipag 1000 MCG Tabs Commonly known as:  UPTRAVI Take 1,000 mg by mouth every morning.   spironolactone 50 MG tablet Commonly known as:  ALDACTONE TAKE 1 TABLET DAILY   SYMAX-SR 0.375 MG 12 hr tablet Generic drug:  hyoscyamine TAKE 1 TABLET TWICE A DAY   tadalafil (PAH) 20 MG tablet Commonly known as:  ADCIRCA TAKE 2 TABLETS (40 MG TOTAL) DAILY       Disposition   The patient will be discharged in stable condition to home. Discharge Instructions    (HEART FAILURE PATIENTS) Call MD:  Anytime you have any of the following symptoms: 1) 3 pound weight gain in 24 hours or 5 pounds in 1 week 2) shortness of breath, with or without a dry hacking cough 3) swelling in the hands, feet or stomach 4) if you have to sleep on extra pillows at night in order to breathe.    Complete by:  As directed    Diet - low sodium heart healthy    Complete by:  As directed    Heart Failure patients record your daily weight using the same scale at the same time of day    Complete by:  As directed    Increase activity slowly    Complete by:  As directed      Follow-up Information    [facility] Follow up on 01/09/2017.   Specialty:   Cardiology Why:  at 1100 am for post hospital follow up. Please bring all of your medications with you to your visit. The code for parking is Nurse, children's information: [address] [id number] mc [place] [address] [phone]            Duration of Discharge Encounter: Greater than 35 minutes   Signed, Luane School 01/02/2017, 11:19 AM  Patient seen and examined with Otilio Saber, PA-C. We discussed all aspects of the encounter. I agree with the assessment and  plan as stated above.   Suspect syncope related to RV failure and PAH. LINQ placed to surveil for malignant arrhythmias. Ok for d/c home today.   Dacia Capers,MD 8:32 PM

## 2017-01-02 NOTE — Care Management Note (Signed)
Case Management Note  Patient Details  Name: Darren Mueller MRN: [id number] Date of Birth: [birth year]  Subjective/Objective:             Spoke to patient at the bedside. He states he will DC to home and his son will be staying with him. He states he walks independently and drove PTA. PCP established. No HH or DME needs identified.        Action/Plan:  No CM needs identified. Patient will DC to home.  Expected Discharge Date:  01/02/17               Expected Discharge Plan:  Home/Self Care  In-House Referral:     Discharge planning Services  CM Consult  Post Acute Care Choice:    Choice offered to:     DME Arranged:    DME Agency:     HH Arranged:    HH Agency:     Status of Service:  Completed, signed off  If discussed at [employer] of Stay Meetings, dates discussed:    Additional Comments:  Carles Collet, RN 01/02/2017, 11:51 AM

## 2017-01-02 NOTE — Progress Notes (Signed)
Advanced Heart Failure Rounding Note  Primary Cardiologist: Dr. Haroldine Laws   Subjective:    Admitted 12/31/16 after syncope. Underwent LINQ implant 3/1  Feels better this am.  No dizziness or SOB   Objective:   Weight Range: 196 lb 4.8 oz (89 kg) Body mass index is 26.62 kg/m.   Vital Signs:   Temp:  [97.6 F (36.4 C)-98.1 F (36.7 C)] 98 F (36.7 C) (03/02 0527) Pulse Rate:  [71-73] 71 (03/02 0527) Resp:  [18-20] 18 (03/02 0527) BP: (148-172)/(89-92) 150/91 (03/02 0527) SpO2:  [99 %-100 %] 99 % (03/02 0527) Weight:  [196 lb 4.8 oz (89 kg)] 196 lb 4.8 oz (89 kg) (03/02 0527) Last BM Date: 12/29/16  Weight change: Filed Weights   12/31/16 1353 01/01/17 0048 01/02/17 0527  Weight: 198 lb 13.7 oz (90.2 kg) 198 lb 4.8 oz (89.9 kg) 196 lb 4.8 oz (89 kg)    Intake/Output:   Intake/Output Summary (Last 24 hours) at 01/02/17 0717 Last data filed at 01/01/17 2300  Gross per 24 hour  Intake             1320 ml  Output                0 ml  Net             1320 ml     Physical Exam: General:  NAD. +facial flushing.   HEENT: Normal Neck: supple. JVD 8 Carotids 2+ bilat; no bruits. No lymphadenopathy or thyromegaly appreciated. Cor: PMI nondisplaced. RRR. 2/6 TR. Loud P2 + RV lift.  Lungs: Mildly diminished basilar sounds Abdomen: soft, NT, ND, no HSM. No bruits or masses. +BS  Extremities: no cyanosis, clubbing, rash, no edema Neuro: alert & oriented x 3, cranial nerves grossly intact. moves all 4 extremities w/o difficulty. Affect pleasant.   Telemetry: Personally reviewed, NSR 60-70s with PVCs  Labs: CBC  Recent Labs  01/01/17 0240 01/02/17 0248  WBC 2.0* 2.2*  NEUTROABS 1.0* 1.1*  HGB 12.3* 12.5*  HCT 35.8* 35.1*  MCV 103.5* 105.1*  PLT 40* 40*   Basic Metabolic Panel  Recent Labs  12/31/16 0800 01/01/17 0240 01/02/17 0248  NA  --  137 136  K  --  4.1 4.1  CL  --  108 104  CO2  --  24 24  GLUCOSE  --  93 95  BUN  --  17 18  CREATININE  --   1.44* 1.46*  CALCIUM  --  9.2 9.4  MG 1.5*  --   --    Liver Function Tests No results for input(s): AST, ALT, ALKPHOS, BILITOT, PROT, ALBUMIN in the last 72 hours. No results for input(s): LIPASE, AMYLASE in the last 72 hours. Cardiac Enzymes  Recent Labs  12/31/16 0249  TROPONINI 0.03*    BNP: BNP (last 3 results)  Recent Labs  06/04/16 0820 07/19/16 0223 12/31/16 0257  BNP 171.9* 665.9* 589.6*    ProBNP (last 3 results)  Recent Labs  04/28/16 1400 07/22/16 1317  PROBNP 383.0* 327.0*     D-Dimer No results for input(s): DDIMER in the last 72 hours. Hemoglobin A1C No results for input(s): HGBA1C in the last 72 hours. Fasting Lipid Panel No results for input(s): CHOL, HDL, LDLCALC, TRIG, CHOLHDL, LDLDIRECT in the last 72 hours. Thyroid Function Tests No results for input(s): TSH, T4TOTAL, T3FREE, THYROIDAB in the last 72 hours.  Invalid input(s): FREET3  Other results:     Imaging/Studies:  No results  found.    Medications:     Scheduled Medications: . Fluticasone Propionate  1 application Topical Daily  . furosemide  40 mg Oral Once per day on Mon Fri  . hyoscyamine  0.375 mg Oral BID  . levothyroxine  25 mcg Oral QAC breakfast  . losartan  50 mg Oral Daily  . Macitentan  10 mg Oral Daily  . mometasone-formoterol  2 puff Inhalation Daily  . multivitamin with minerals  1 tablet Oral Daily  . pantoprazole  40 mg Oral Daily  . potassium chloride  10 mEq Oral Daily  . Selexipag  1,000 mcg Oral q morning - 10a  . Selexipag  800 mcg Oral QPM  . sodium chloride flush  3 mL Intravenous Q12H  . spironolactone  50 mg Oral Daily  . tadalafil (PAH)  40 mg Oral Daily    Infusions:   PRN Medications: sodium chloride, acetaminophen, albuterol, doxepin, loperamide, ondansetron (ZOFRAN) IV, sodium chloride flush   Assessment   1. Syncope 2. PAH 3. CKD III 4. Cirrhosis 5. HTN 6. OSA 7. Pancytopenia  Plan    Telemetry reviewed  personally. No VT or other serious arrhythmia. Suspect syncope related to PAH/RV failure and likely not arrhythmia. However, given frequent PVCs and recurrent syncope, I do think LINQ is reasonable.  Volume status looks ok. Back on PAH meds.   Echo reviewed personally LVEF 50-55%. + RV strain RVSP 65-70 range  Pancytopenia felt to be related to cirrhosis.   Oh for d/c home today with close outpatient f/u   Length of Stay: 2   Glori Bickers, MD  01/02/2017, 7:17 AM  Advanced Heart Failure Team Pager [phone] (M-F; 7a - 4p)  Please contact [facility] for night-coverage after hours (4p -7a ) and weekends on [email or web address]  Patient seen and examined with Oda Kilts, PA-C. We discussed all aspects of the encounter. I agree with the assessment and plan as stated above.    Bensimhon, Daniel,MD 7:17 AM

## 2017-01-03 NOTE — Progress Notes (Signed)
Discharged to home with family office visits in place teaching done  

## 2017-01-09 ENCOUNTER — Encounter (HOSPITAL_COMMUNITY): Payer: Medicare Other

## 2017-01-13 ENCOUNTER — Ambulatory Visit: Payer: Medicare Other

## 2017-01-14 ENCOUNTER — Encounter: Payer: Self-pay | Admitting: Internal Medicine

## 2017-01-15 ENCOUNTER — Ambulatory Visit (HOSPITAL_COMMUNITY)
Admission: RE | Admit: 2017-01-15 | Discharge: 2017-01-15 | Disposition: A | Discharge status: Home / Self Care | Payer: Medicare Other | Source: Ambulatory Visit | Attending: Cardiology | Admitting: Cardiology

## 2017-01-15 ENCOUNTER — Encounter (HOSPITAL_COMMUNITY): Payer: Self-pay

## 2017-01-15 VITALS — BP 152/76 | HR 80 | Wt 200.4 lb

## 2017-01-15 DIAGNOSIS — J449 Chronic obstructive pulmonary disease, unspecified: Secondary | ICD-10-CM | POA: Diagnosis not present

## 2017-01-15 DIAGNOSIS — D61818 Other pancytopenia: Secondary | ICD-10-CM | POA: Insufficient documentation

## 2017-01-15 DIAGNOSIS — I2510 Atherosclerotic heart disease of native coronary artery without angina pectoris: Secondary | ICD-10-CM | POA: Insufficient documentation

## 2017-01-15 DIAGNOSIS — I10 Essential (primary) hypertension: Secondary | ICD-10-CM

## 2017-01-15 DIAGNOSIS — N183 Chronic kidney disease, stage 3 (moderate): Secondary | ICD-10-CM | POA: Diagnosis not present

## 2017-01-15 DIAGNOSIS — R55 Syncope and collapse: Secondary | ICD-10-CM | POA: Insufficient documentation

## 2017-01-15 DIAGNOSIS — I2721 Secondary pulmonary arterial hypertension: Secondary | ICD-10-CM | POA: Diagnosis not present

## 2017-01-15 DIAGNOSIS — I50810 Right heart failure, unspecified: Secondary | ICD-10-CM

## 2017-01-15 DIAGNOSIS — G4733 Obstructive sleep apnea (adult) (pediatric): Secondary | ICD-10-CM | POA: Diagnosis not present

## 2017-01-15 DIAGNOSIS — K7460 Unspecified cirrhosis of liver: Secondary | ICD-10-CM | POA: Insufficient documentation

## 2017-01-15 DIAGNOSIS — K766 Portal hypertension: Secondary | ICD-10-CM | POA: Diagnosis not present

## 2017-01-15 DIAGNOSIS — S2231XA Fracture of one rib, right side, initial encounter for closed fracture: Secondary | ICD-10-CM | POA: Diagnosis not present

## 2017-01-15 DIAGNOSIS — I129 Hypertensive chronic kidney disease with stage 1 through stage 4 chronic kidney disease, or unspecified chronic kidney disease: Secondary | ICD-10-CM | POA: Diagnosis not present

## 2017-01-15 DIAGNOSIS — X58XXXA Exposure to other specified factors, initial encounter: Secondary | ICD-10-CM | POA: Diagnosis not present

## 2017-01-15 DIAGNOSIS — I272 Pulmonary hypertension, unspecified: Secondary | ICD-10-CM | POA: Insufficient documentation

## 2017-01-15 DIAGNOSIS — I2729 Other secondary pulmonary hypertension: Secondary | ICD-10-CM | POA: Diagnosis not present

## 2017-01-15 MED ORDER — FUROSEMIDE 40 MG PO TABS: ORAL_TABLET | ORAL | 3 refills | Status: DC

## 2017-01-15 NOTE — Progress Notes (Signed)
ADVANCED HF CLINIC NOTE  Patient ID: Darren Mueller, male   DOB: [birth year], 66 y.o.   MRN: [id number] GI : Dr Ardis Hughs PCP: Dr Charlett Blake  Subjective:    Darren Mueller ("the Darren Mueller") is a 66 yo with history of COPD, portopulmonary hypertension with RV failure, and ETOH cirrhosis presents for followup of his PAH.  Admitted in 6/17 with CP and SOB. Was diuresed. Troponins normal. Underwent R/L cath. Minimal CAD with moderate PAH and preserved cardiac output.   Admitted 06/2016 and 07/2016 with syncope.  On September admission found to have elevated ETOH level as well as R 6th rib fracture.   Wore 30 day monitory up until 08/28/16. No AF noted. No events.  9% tachycardia.   Admitted the end of February after syncopal event. LINQ placed.   Today he returns for post hospital follow up. Overall feeling ok. Denies SOB/PND/Orthopnea. Denies syncope/presyncope. Weight at home 200-201 pounds. Taking all medications. Drinking 4 drinks of liquor a week.   PAH meds 1) Macitnentan 10 mg daily 2) Adcirca 40 mg daily  3) Selexapeg 1800 daily  Studies:  ECHO 12/14 EF 55-60% Peak PA pressure 35. Severe RV dysfunction  ECHO 7/15 EF 60% RV moderately to severely dilated. Moderate HK RVSP 85m HG  ECHO 6/16 EF 60% RV moderately to severely dilated. Severe HK RVSP ~65 mm HG. D-shaped septum Echo 2/17 LVEF 60-65% RV massively dilated. Flat septum. Severe HK. Moderate TR RVSP ~65. IVC small. No effusion  ECHO 01/01/2017 EF 50-55% Grade I DD. RV severely dilated. Peak PA pressure 62 mm hg  Cath 6/17 Mid RCA lesion, 20% stenosed. Dist LAD lesion, 20% stenosed.  Ao = 107/70 (87) LV = 106/3/11 RA = 4 RV = 74/11 PA = 74/26 (45) PCW = 6 Fick cardiac output/index = 4.6.2.2 PVR = 8.5 Ao sat = 95% PA sat = 61%, 58%  RHC 2/17 RA = 11 RV = 80/16/19 PA = 83/61 (71) PCW = 9 Fick cardiac output/index = 5.6/2.6 PVR = 10.3 WU Ao sat = 94% PA sat = 65%, 68% High SVC sat = 65%  RHC 2/14: RA = 13  RV =  63/5/14  PA = 65/24 (41)  PCW = 6  Hepatic wedge = 21  Fick cardiac output/index = 3.8/1.8  PVR = 9.1  FA sat = 94%  PA sat = 58%, 56%  SVC = 54%  RHC 06/06/14 RA = 5  RV = 73/5/9  PA = 81/25 (46)  PCW = 7  Fick cardiac output/index = 6.0/2.8  PVR = 6.5 Woods  FA sat = 95%  PA sat = 71%, 67%  Unable to get hepatic wedge  PFTs 3/14 FEV1 2.02 L (58%) FVC 2.7L (54%) FEV1/FVC (89%) DLCO 53%  PFTs 1/17 FEV1 2.05 L (61%) FVC 2.48L (56%) DLCO 44%   Event Monitor - NSR 10/26/13.   Ab u/s 8/16: + cirrhosis/mild to moderate splenomegaly  6 min walk 01/10/13, 1290 feet  6MW (01/10/14) = 1280 feet (380 m) 6MW (9/15) = 1350 feet (411 m) O2 sats ranged from 89-95% on room air, HR ranged from 100-129. 6MW (6/16) = 384 meters  6MW (2/17) = 1250 feet (3867m6MW (8/17) = 1320 feet   Labs:  10/19/13 Creatinine 1.79 K 4.1  3/15        Cr 1.78 K 4.2 03/17/14    CR 1.23 K 4.0 LFTs ok 06/06/14 K 4.0 Creatinine 1.4  08/08/14 K 4.0 Cr 1.6 09/12/14 K 4.3 Cr 1.8 LFTs  ok, HCT 58 12/15 K 4.3 Cr 1.15  06/2015 K 3.6, Cr 1.5 1/17  K 3.7 Cr 1.29 AST 75 ALT 33 Albumin 4.0 Bili 1.4 Wbc 2.2 hgb 13.5 PLT 61k 04/28/16  K 4.3 Cr 1.39 Hgb 12.9 PLT 44 BNP 383   Current Outpatient Prescriptions on File Prior to Encounter  Medication Sig Dispense Refill  . Fluticasone Propionate 0.05 % LOTN Apply daily to affected area (Patient taking differently: Apply 1 application topically daily. Apply daily to affected area) 3 Bottle 3  . levothyroxine (SYNTHROID, LEVOTHROID) 25 MCG tablet TAKE 1 TABLET DAILY 90 tablet 1  . loperamide (ANTI-DIARRHEAL) 2 MG capsule Take 4 mg by mouth as needed for diarrhea or loose stools (up to 8 capsules per day).     Marland Kitchen losartan (COZAAR) 50 MG tablet Take 50 mg by mouth daily.    . Macitentan (OPSUMIT) 10 MG TABS Take 10 mg by mouth daily. 90 tablet 3  . Multiple Vitamin (MULTIVITAMIN WITH MINERALS) TABS tablet Take 1 tablet by mouth daily.    Marland Kitchen omeprazole (PRILOSEC) 40 MG capsule  TAKE 1 CAPSULE DAILY 90 capsule 2  . Selexipag (UPTRAVI) 1000 MCG TABS Take 1,000 mg by mouth every morning. 90 tablet 2  . Selexipag (UPTRAVI) 800 MCG TABS Take 1 tablet (800 mcg total) by mouth every evening. 90 tablet 2  . spironolactone (ALDACTONE) 50 MG tablet TAKE 1 TABLET DAILY 90 tablet 0  . SYMAX-SR 0.375 MG 12 hr tablet TAKE 1 TABLET TWICE A DAY 180 tablet 1  . tadalafil, PAH, (ADCIRCA) 20 MG tablet TAKE 2 TABLETS (40 MG TOTAL) DAILY 180 tablet 2   No current facility-administered medications on file prior to encounter.     Objective:   Weight Range:  Vital Signs:   Pulse Rate:  [80] 80 (03/15 1054) BP: (152)/(76) 152/76 (03/15 1054) SpO2:  [97 %] 97 % (03/15 1054) Weight:  [200 lb 6.4 oz (90.9 kg)] 200 lb 6.4 oz (90.9 kg) (03/15 1054)    Wt Readings from Last 3 Encounters:  01/15/17 200 lb 6.4 oz (90.9 kg)  01/02/17 196 lb 4.8 oz (89 kg)  12/15/16 203 lb 9.6 oz (92.4 kg)    Physical Exam: General: NAD, facial flushing. + nasal congestion Neck: JVP ~8-9  cm  no thyromegaly or thyroid nodule.  Lungs: CTAB, normal effort. CV: Nondisplaced PMI. Heart regular  2/6 TR  Loud P2  + RV lift  No carotid bruit.   Abdomen: Soft, NT, ND, no HSM. No bruits or masses. +BS  Ext: warm, No clubbing, trace edema.Normal pedal pulses. Skin: Intact without lesions or rashes.  Neurologic: Alert and oriented x 3.  Psych: Normal affect.  Extremities: No clubbing or cyanosis.  HEENT: Normal. Except for erythematous rash/flushing   Assessment/Plan     1. PAH: Suspected portopulmonary HTN. - Remains on triple therapy. Recent RHC with slightly improved hemodynamics  - Persistent NYHA III symptoms.  Continue triple therapy-->macitentan 10 daily, adcirca 40 mg daily, selexipag 1000/800.  - - Discussed ETOH cessation.  - Volume status stable. Continue lasix twice a week. Plan to hold on Friday then resume twice a week next Monday.  2. CKD III:  BMET reviewed from 01/02/2017 stable.  3.  Cirrhosis:  - Likely combination of RV failure an ETOH - As above, again recommended complete cessation of ETOH.  - Follows with Dr. Ardis Hughs.  4. Syncope - LINQ placed 01/01/17 . [employer] evaluated today. No arrhythmia detected.  5. HTN:  -  Mildly elevated in clinic today. Usually stable. Will not change meds today.  6. OSA:  -Has mild OSA with AHI 12 and desats down to 82%.  - Intolerant CPAP.  - Have encouraged to use a pulse ox and use 02 prn to keep sats > 90% 7. Pancytopenia:  - Has also seen Dr. Alen Blew in Hematology - felt like it is due to splenic sequestration.    Follow up in 6 weeks with Dr Haroldine Laws. Needs 6MW at that time.   Amy Clegg, NP-C  11:26 AM

## 2017-01-15 NOTE — Progress Notes (Addendum)
Advanced Heart Failure Medication Review by a Pharmacist  Does the patient  feel that his/her medications are working for him/her?  yes  Has the patient been experiencing any side effects to the medications prescribed?  yes - diarrhea with [name], managing with loperamide  Does the patient measure his/her own blood pressure or blood glucose at home?  yes   Does the patient have any problems obtaining medications due to transportation or finances?   yes  Understanding of regimen: good Understanding of indications: good Potential of compliance: good Patient understands to avoid NSAIDs. Patient understands to avoid decongestants.  Issues to address at subsequent visits: grant application  Pharmacist comments: Mr. Mcparland is a pleasant 66 yo AAM presenting to the advanced HF clinic. He brought his medications with him to the appointment today, and the following med discrepancies were found: - Taking Advair instead of Dulera, taking 2 puffs qd (usually bid - instructed to contact Dr Charlett Blake who he states prescribed the medication for clarification, has visit next week) - Taking Lasix MWF instead of MF as listed on DC summary by Dr. Haroldine Laws He had a question about taking Adcirca 2 tablets daily. He was on 1 tablet daily and wants to know why the dose changed. However, it seems that he has been on 52m (2 tablets) since last year. He would also like information about applying for a grant to help with his medications. Upon discussion with EDoroteo Bradford it was the [employer] which no longer has funding. Instructed Mr. JDetloffto contact his specialty pharmacy for patient assistance from them directly. No other questions or concerns.   ABelia Heman PharmD PGY1 Pharmacy Resident [phone](Pager) 01/15/2017 11:14 AM  Time with patient: 10 min Preparation and documentation time: 2 min Total time: 12 min

## 2017-01-15 NOTE — Patient Instructions (Addendum)
Mr. Kibbe, please call your Specialty Pharmacy that fills your [facility] medications for patient assistance. Let them know you need help affording your medications, and they will direct you to the right applications. The [employer] no longer has any funds. It was nice to meet you today! - Apryl, PharmD  HOLD Lasix today, then resume tomorrow and continue to take 40 mg (1 tablet) once on Monday and Friday mornings each week.  Follow up 6-8 weeks with Dr. Haroldine Laws.  Do the following things EVERYDAY: 1) Weigh yourself in the morning before breakfast. Write it down and keep it in a log. 2) Take your medicines as prescribed 3) Eat low salt foods-Limit salt (sodium) to 2000 mg per day.  4) Stay as active as you can everyday 5) Limit all fluids for the day to less than 2 liters

## 2017-01-20 ENCOUNTER — Other Ambulatory Visit (HOSPITAL_COMMUNITY): Payer: Self-pay | Admitting: Pharmacist

## 2017-01-20 MED ORDER — SELEXIPAG 800 MCG PO TABS: 800.0000 ug | ORAL_TABLET | Freq: Every evening | ORAL | 3 refills | Status: DC

## 2017-01-20 MED ORDER — SELEXIPAG 1000 MCG PO TABS: 1000.0000 ug | ORAL_TABLET | Freq: Every morning | ORAL | 3 refills | Status: DC

## 2017-01-22 ENCOUNTER — Ambulatory Visit: Payer: Medicare Other

## 2017-01-26 ENCOUNTER — Ambulatory Visit (INDEPENDENT_AMBULATORY_CARE_PROVIDER_SITE_OTHER): Payer: Medicare Other | Admitting: *Deleted

## 2017-01-26 DIAGNOSIS — R55 Syncope and collapse: Secondary | ICD-10-CM

## 2017-01-26 LAB — CUP PACEART INCLINIC DEVICE CHECK
Date Time Interrogation Session: 20180326110423
MDC IDC PG IMPLANT DT: 20180301

## 2017-01-26 NOTE — Progress Notes (Signed)
Loop wound check in clinic s/p implant 01/01/17. Steri-strips removed. Wound edges approximated. No redness, swelling or drainage present. Battery status: good. No episodes detected since implant. Discussed symptom activator- pt reports that he does not have any preemptive symptoms prior to syncopal episodes. He is aware that there would be no use for symptom activator. Carelink monitor connected. Monthly summary reports and ROV with JA 04/29/17.

## 2017-02-02 ENCOUNTER — Ambulatory Visit (INDEPENDENT_AMBULATORY_CARE_PROVIDER_SITE_OTHER): Payer: Medicare Other | Admitting: *Deleted

## 2017-02-02 DIAGNOSIS — R55 Syncope and collapse: Secondary | ICD-10-CM

## 2017-02-02 NOTE — Progress Notes (Signed)
Carelink Summary Report / Loop Recorder 

## 2017-02-03 ENCOUNTER — Other Ambulatory Visit (HOSPITAL_COMMUNITY): Payer: Self-pay | Admitting: Adult Health

## 2017-02-04 LAB — CUP PACEART REMOTE DEVICE CHECK
Date Time Interrogation Session: 20180331153558
Implantable Pulse Generator Implant Date: 20180301

## 2017-02-27 ENCOUNTER — Other Ambulatory Visit: Payer: Self-pay | Admitting: Family Medicine

## 2017-03-02 ENCOUNTER — Ambulatory Visit (INDEPENDENT_AMBULATORY_CARE_PROVIDER_SITE_OTHER): Payer: Medicare Other | Admitting: *Deleted

## 2017-03-02 DIAGNOSIS — R55 Syncope and collapse: Secondary | ICD-10-CM | POA: Diagnosis not present

## 2017-03-02 NOTE — Progress Notes (Signed)
Carelink Summary Report / Loop Recorder 

## 2017-03-04 ENCOUNTER — Encounter (HOSPITAL_COMMUNITY): Payer: Self-pay | Admitting: Internal Medicine

## 2017-03-04 ENCOUNTER — Ambulatory Visit (HOSPITAL_COMMUNITY)
Admission: RE | Admit: 2017-03-04 | Discharge: 2017-03-04 | Disposition: A | Discharge status: Home / Self Care | Payer: Medicare Other | Source: Ambulatory Visit | Attending: Internal Medicine | Admitting: Internal Medicine

## 2017-03-04 VITALS — BP 132/76 | HR 89 | Wt 200.5 lb

## 2017-03-04 DIAGNOSIS — I2721 Secondary pulmonary arterial hypertension: Secondary | ICD-10-CM | POA: Diagnosis not present

## 2017-03-04 DIAGNOSIS — J449 Chronic obstructive pulmonary disease, unspecified: Secondary | ICD-10-CM | POA: Insufficient documentation

## 2017-03-04 DIAGNOSIS — R079 Chest pain, unspecified: Secondary | ICD-10-CM | POA: Diagnosis not present

## 2017-03-04 DIAGNOSIS — I129 Hypertensive chronic kidney disease with stage 1 through stage 4 chronic kidney disease, or unspecified chronic kidney disease: Secondary | ICD-10-CM | POA: Diagnosis not present

## 2017-03-04 DIAGNOSIS — I2510 Atherosclerotic heart disease of native coronary artery without angina pectoris: Secondary | ICD-10-CM | POA: Insufficient documentation

## 2017-03-04 DIAGNOSIS — R55 Syncope and collapse: Secondary | ICD-10-CM | POA: Insufficient documentation

## 2017-03-04 DIAGNOSIS — R072 Precordial pain: Secondary | ICD-10-CM

## 2017-03-04 DIAGNOSIS — N183 Chronic kidney disease, stage 3 (moderate): Secondary | ICD-10-CM | POA: Diagnosis not present

## 2017-03-04 DIAGNOSIS — I50810 Right heart failure, unspecified: Secondary | ICD-10-CM | POA: Insufficient documentation

## 2017-03-04 DIAGNOSIS — D61818 Other pancytopenia: Secondary | ICD-10-CM | POA: Diagnosis not present

## 2017-03-04 DIAGNOSIS — K7031 Alcoholic cirrhosis of liver with ascites: Secondary | ICD-10-CM

## 2017-03-04 DIAGNOSIS — G4733 Obstructive sleep apnea (adult) (pediatric): Secondary | ICD-10-CM | POA: Diagnosis not present

## 2017-03-04 DIAGNOSIS — K7030 Alcoholic cirrhosis of liver without ascites: Secondary | ICD-10-CM | POA: Insufficient documentation

## 2017-03-04 DIAGNOSIS — I2729 Other secondary pulmonary hypertension: Secondary | ICD-10-CM

## 2017-03-04 MED ORDER — SELEXIPAG 1000 MCG PO TABS: 1000.0000 ug | ORAL_TABLET | Freq: Two times a day (BID) | ORAL | 3 refills | Status: DC

## 2017-03-04 NOTE — Patient Instructions (Addendum)
Increase Uptravi (Selexipag) to 1000 mcg Twice daily   Your physician recommends that you schedule a follow-up appointment in: 3 months

## 2017-03-04 NOTE — Progress Notes (Signed)
ADVANCED HF CLINIC NOTE  Patient ID: Darren Mueller, male   DOB: 1951-05-07, 66 y.o.   MRN: [id number] GI : Dr Ardis Hughs PCP: Dr Charlett Blake  Subjective:    Darren Mueller ("[name]") is a 66 yo with history of COPD, portopulmonary hypertension with RV failure, and ETOH cirrhosis.  Admitted in 6/17 with CP and SOB. Was diuresed. Troponins normal. Underwent R/L cath. Minimal CAD with moderate PAH and preserved cardiac output.   Admitted 06/2016 and 07/2016 with syncope.  On September admission found to have elevated ETOH level as well as R 6th rib fracture.   Wore 30 day monitory up until 08/28/16. No AF noted. No events.  9% tachycardia.   Admitted the end of February after syncopal event. LINQ placed.   Today he returns for [facility] follow up. Overall feeling ok. He is stressed out because he is caring for his parents who re both nearing the ned of their lives. Denies syncope/presyncope. He has had occasional CP walking that occurs mainly with stress. Very fleeting. Remains SOB with exertion. Denies PND/Orthopnea. Weight at home 200 pounds. Appetite ok. Taking all medications.    PAH meds 1) Macitnentan 10 mg daily 2) Adcirca 40 mg daily  3) Selexapeg 1000/800 daily  Studies:  ECHO 12/14 EF 55-60% Peak PA pressure 35. Severe RV dysfunction  ECHO 7/15 EF 60% RV moderately to severely dilated. Moderate HK RVSP 26m HG  ECHO 6/16 EF 60% RV moderately to severely dilated. Severe HK RVSP ~65 mm HG. D-shaped septum Echo 2/17 LVEF 60-65% RV massively dilated. Flat septum. Severe HK. Moderate TR RVSP ~65. IVC small. No effusion  ECHO 01/01/2017 EF 50-55% Grade I DD. RV severely dilated. Peak PA pressure 62 mm hg  Cath 6/17 Mid RCA lesion, 20% stenosed. Dist LAD lesion, 20% stenosed.  Ao = 107/70 (87) LV = 106/3/11 RA = 4 RV = 74/11 PA = 74/26 (45) PCW = 6 Fick cardiac output/index = 4.6.2.2 PVR = 8.5 Ao sat = 95% PA sat = 61%, 58%  RHC 2/17 RA = 11 RV = 80/16/19 PA = 83/61 (71) PCW =  9 Fick cardiac output/index = 5.6/2.6 PVR = 10.3 WU Ao sat = 94% PA sat = 65%, 68% High SVC sat = 65%  RHC 2/14: RA = 13  RV = 63/5/14  PA = 65/24 (41)  PCW = 6  Hepatic wedge = 21  Fick cardiac output/index = 3.8/1.8  PVR = 9.1  FA sat = 94%  PA sat = 58%, 56%  SVC = 54%  RHC 06/06/14 RA = 5  RV = 73/5/9  PA = 81/25 (46)  PCW = 7  Fick cardiac output/index = 6.0/2.8  PVR = 6.5 Woods  FA sat = 95%  PA sat = 71%, 67%  Unable to get hepatic wedge  PFTs 3/14 FEV1 2.02 L (58%) FVC 2.7L (54%) FEV1/FVC (89%) DLCO 53%  PFTs 1/17 FEV1 2.05 L (61%) FVC 2.48L (56%) DLCO 44%   Event Monitor - NSR 10/26/13.   Ab u/s 8/16: + cirrhosis/mild to moderate splenomegaly  6 min walk 01/10/13, 1290 feet  6MW (01/10/14) = 1280 feet (380 m) 6MW (9/15) = 1350 feet (411 m) O2 sats ranged from 89-95% on room air, HR ranged from 100-129. 6MW (6/16) = 384 meters  6MW (2/17) = 1250 feet (3880m6MW (8/17) = 1320 feet      Current Outpatient Prescriptions on File Prior to Encounter  Medication Sig Dispense Refill  . Albuterol Sulfate (PROAIR  RESPICLICK) 387 (90 Base) MCG/ACT AEPB Inhale 2 puffs into the lungs every 6 (six) hours as needed (SOB or wheezing).    Marland Kitchen doxepin (SINEQUAN) 10 MG capsule Take 10-20 mg by mouth at bedtime as needed.    . Fluticasone Propionate 0.05 % LOTN Apply daily to affected area 3 Bottle 3  . fluticasone-salmeterol (ADVAIR HFA) 230-21 MCG/ACT inhaler Inhale 2 puffs into the lungs every morning.    . furosemide (LASIX) 40 MG tablet Take 40 mg (1 tab) once on Monday and Friday mornings. 30 tablet 3  . levothyroxine (SYNTHROID, LEVOTHROID) 25 MCG tablet TAKE 1 TABLET DAILY 90 tablet 1  . loperamide (ANTI-DIARRHEAL) 2 MG capsule Take 4 mg by mouth as needed for diarrhea or loose stools (up to 8 capsules per day).     Marland Kitchen losartan (COZAAR) 50 MG tablet Take 50 mg by mouth daily.    . Macitentan (OPSUMIT) 10 MG TABS Take 10 mg by mouth daily. 90 tablet 3  . Multiple  Vitamin (MULTIVITAMIN WITH MINERALS) TABS tablet Take 1 tablet by mouth daily.    Marland Kitchen omeprazole (PRILOSEC) 40 MG capsule TAKE 1 CAPSULE DAILY 90 capsule 2  . potassium chloride (K-DUR,KLOR-CON) 10 MEQ tablet Take 10 mEq by mouth. 2 tablets daily, extra 2 tablets on Monday and Friday    . Selexipag (UPTRAVI) 1000 MCG TABS Take 1,000 mcg by mouth every morning. 90 tablet 3  . Selexipag (UPTRAVI) 800 MCG TABS Take 1 tablet (800 mcg total) by mouth every evening. 90 tablet 3  . spironolactone (ALDACTONE) 50 MG tablet TAKE 1 TABLET DAILY 90 tablet 1  . SYMAX-SR 0.375 MG 12 hr tablet TAKE 1 TABLET TWICE A DAY 180 tablet 1  . tadalafil, PAH, (ADCIRCA) 20 MG tablet TAKE 2 TABLETS (40 MG TOTAL) DAILY 180 tablet 2   No current facility-administered medications on file prior to encounter.     Objective:   Weight Range:  Vital Signs:   Pulse Rate:  [89] 89 (05/02 1446) BP: (132)/(76) 132/76 (05/02 1446) SpO2:  [100 %] 100 % (05/02 1446) Weight:  [200 lb 8 oz (90.9 kg)] 200 lb 8 oz (90.9 kg) (05/02 1446)    Wt Readings from Last 3 Encounters:  03/04/17 200 lb 8 oz (90.9 kg)  01/15/17 200 lb 6.4 oz (90.9 kg)  01/02/17 196 lb 4.8 oz (89 kg)    Physical Exam: General:  Well appearing. No resp difficulty HEENT: normal x for flushing Neck: supple. JVP 7 Carotids 2+ bilat; no bruits. No lymphadenopathy or thryomegaly appreciated. Cor: PMI nondisplaced. Regular rate & rhythm. 2/6 TR Loud P2 + RV lift  Lungs: clear with decreased BS throughout Abdomen: soft, nontender, nondistended. No hepatosplenomegaly. No bruits or masses. Good bowel sounds. Extremities: no cyanosis, clubbing, rash, edema Neuro: alert & orientedx3, cranial nerves grossly intact. moves all 4 extremities w/o difficulty. Affect pleasant   Assessment/Plan     1. PAH: Suspected portopulmonary HTN in setting of ETOH cirrhosis.  Recent RHC with slightly improved hemodynamics  -Stable NYHA II-III - Continue triple  therapy-->macitentan 10 daily, adcirca 40 mg daily, and increase selexipag to 1000 mcg twice a day. Goal toitration 1600 bid - Discussed need for complete ETOH cessation.  - Volume status stable. Continue lasix twice a week  2. CKD III:  3. Cirrhosis:  - Likely combination of RV failure an ETOH - As above, again recommended complete cessation of ETOH.  - Follows with Dr. Ardis Hughs.  4. Syncope - LINQ placed 01/01/17 .  5. HTN:  - Stable.  6. OSA:  -Has mild OSA with AHI 12 and desats down to 82%.  - Intolerant CPAP.  7. Pancytopenia:  - Has also seen Dr. Alen Blew in Hematology - felt like it is due to splenic sequestration.  8. Chest pain - doubt ischemic given results of previous cath. I reassured him   Follow up 3 months   Amy Clegg, NP-C  3:13 PM  Patient seen and examined with Darrick Grinder, NP. We discussed all aspects of the encounter. I agree with the assessment and plan as stated above.   Overall stable NYHA II-III PAH on triple therapy. Will increase selexipag to 1000 bid. Volume status on high end of normal range. Continue lasix 2 times per week. Can take extra as needed. Doubt CP is ischemic given recent cath results. I reassured him. He has cut down on ETOH but reminded him of need to stop completely.  Glori Bickers, MD  9:03 PM

## 2017-03-14 LAB — CUP PACEART REMOTE DEVICE CHECK
Date Time Interrogation Session: 20180430153959
Implantable Pulse Generator Implant Date: 20180301

## 2017-03-14 NOTE — Progress Notes (Signed)
Carelink summary report received. Battery status OK. Normal device function. No new tachy episodes, brady, or pause episodes. No new AF episodes. 1 sx. episode, ECG appears SR throughout.  Monthly summary reports and ROV/PRN

## 2017-03-16 NOTE — Progress Notes (Signed)
Subjective:   Darren Mueller is a 66 y.o. male who presents for an Initial Medicare Annual Wellness Visit.  The Patient was informed that the wellness visit is to identify future health risk and educate and initiate measures that can reduce risk for increased disease through the lifespan.   Describes health as fair, good or great?  "Good."  Review of Systems  No ROS.  Medicare Wellness Visit.  Cardiac Risk Factors include: advanced age (>44mn, >>9women);dyslipidemia;hypertension;male gender  Sleep patterns: gets up 1 times nightly to void and sleeps 4 hours nightly. Is not getting as much sleep right now due to taking care of his parents. Does not take daytime nap.   Home Safety/Smoke Alarms: Feels safe in home. Smoke alarms in place.    Living environment; residence and Firearm Safety: Lives w/ son.  1-story house/ trailer, firearms stored safely. Seat Belt Safety/Bike Helmet: Wears seat belt.   Counseling:   [place]  [place]- Follows w/ Dr. CGerlene Burdockevery 4 months  Male:   CCS- last 12/08/13. 5 year recall.      PSA-  Lab Results  Component Value Date   PSA 0.49 11/01/2012      Objective:    Today's Vitals   03/17/17 0816  BP: 120/65  Pulse: 79  Temp: 98.1 F (36.7 C)  TempSrc: Oral  SpO2: 99%  Weight: 201 lb 6.4 oz (91.4 kg)  PainSc: 4    Body mass index is 27.31 kg/m.  Current Medications (verified) Outpatient Encounter Prescriptions as of 03/17/2017  Medication Sig  . Albuterol Sulfate (PROAIR RESPICLICK) 1357(90 Base) MCG/ACT AEPB Inhale 2 puffs into the lungs every 6 (six) hours as needed (SOB or wheezing).  .Marland Kitchendoxepin (SINEQUAN) 10 MG capsule Take 1-2 capsules (10-20 mg total) by mouth at bedtime as needed.  . Fluticasone Propionate 0.05 % LOTN Apply daily to affected area  . fluticasone-salmeterol (ADVAIR HFA) 230-21 MCG/ACT inhaler Inhale 2 puffs into the lungs every morning.  . furosemide (LASIX) 40 MG tablet Take 40 mg (1 tab) once  on Monday and Friday mornings.  .Marland Kitchenlevothyroxine (SYNTHROID, LEVOTHROID) 25 MCG tablet TAKE 1 TABLET DAILY  . loperamide (ANTI-DIARRHEAL) 2 MG capsule Take 4 mg by mouth as needed for diarrhea or loose stools (up to 8 capsules per day).   .Marland Kitchenlosartan (COZAAR) 50 MG tablet Take 50 mg by mouth daily.  . Macitentan (OPSUMIT) 10 MG TABS Take 10 mg by mouth daily.  . Multiple Vitamin (MULTIVITAMIN WITH MINERALS) TABS tablet Take 1 tablet by mouth daily.  .Marland Kitchenomeprazole (PRILOSEC) 40 MG capsule TAKE 1 CAPSULE DAILY  . potassium chloride (K-DUR,KLOR-CON) 10 MEQ tablet Take 10 mEq by mouth. 2 tablets daily, extra 2 tablets on Monday and Friday  . Selexipag (UPTRAVI) 1000 MCG TABS Take 1,000 mcg by mouth 2 (two) times daily.  .Marland Kitchenspironolactone (ALDACTONE) 50 MG tablet TAKE 1 TABLET DAILY  . SYMAX-SR 0.375 MG 12 hr tablet TAKE 1 TABLET TWICE A DAY  . tadalafil, PAH, (ADCIRCA) 20 MG tablet TAKE 2 TABLETS (40 MG TOTAL) DAILY  . [DISCONTINUED] doxepin (SINEQUAN) 10 MG capsule Take 10-20 mg by mouth at bedtime as needed.  . [DISCONTINUED] doxepin (SINEQUAN) 10 MG capsule Take 1-2 capsules (10-20 mg total) by mouth at bedtime as needed.  . [DISCONTINUED] Multiple Vitamin (MULTIVITAMIN WITH MINERALS) TABS tablet Take 1 tablet by mouth daily.  . [DISCONTINUED] Multiple Vitamin (MULTIVITAMIN WITH MINERALS) TABS tablet Take 1 tablet by mouth  daily.  . [DISCONTINUED] UPTRAVI 800 MCG TABS    No facility-administered encounter medications on file as of 03/17/2017.     Allergies (verified) Aspirin   History: Past Medical History:  Diagnosis Date  . Alcohol dependence ([place]) 03/22/2011   In remission   . Alcohol dependence in remission ([place]) 03/22/2011   In remission   . Alcoholic cirrhosis of liver with ascites ([place]) 2014  . Dermatitis 06/10/2015  . Diarrhea 09/04/2013  . Elbow pain, left 03/17/2017  . GERD (gastroesophageal reflux disease)   . Gout   . Heart murmur   . Hx of colonic polyps   . Hypertension   .  Hypertriglyceridemia 03/17/2017  . Hypopotassemia   . Otalgia 11/28/2013  . Other chronic pulmonary heart diseases   . Preventative health care 06/29/2016  . Red eye 03/03/2016   right  . Renal insufficiency 07/18/2013  . Seizures ([place])   . Sleep apnea    does not wear CPAP  . Thrombocytopenia ([place]) 06/29/2016  . Unspecified hypothyroidism 01/25/2013  . Unspecified pleural effusion    Past Surgical History:  Procedure Laterality Date  . CARDIAC CATHETERIZATION N/A 12/21/2015   Procedure: Right Heart Cath;  Surgeon: Jolaine Artist, MD;  Location: [place] [facility];  Service: Cardiovascular;  Laterality: N/A;  . CARDIAC CATHETERIZATION N/A 04/15/2016   Procedure: Right/Left Heart Cath and Coronary Angiography;  Surgeon: Jolaine Artist, MD;  Location: [place] [facility];  Service: Cardiovascular;  Laterality: N/A;  . COLONOSCOPY N/A 12/08/2013   Procedure: COLONOSCOPY;  Surgeon: Milus Banister, MD;  [facility];  Service: Endoscopy;  Laterality: N/A;  . ESOPHAGOGASTRODUODENOSCOPY (EGD) WITH PROPOFOL N/A 03/27/2016   Procedure: ESOPHAGOGASTRODUODENOSCOPY (EGD) WITH PROPOFOL;  Surgeon: Milus Banister, MD;  Location: [place];  Service: Endoscopy;  Laterality: N/A;  . LOOP RECORDER INSERTION N/A 01/01/2017   Procedure: Loop Recorder Insertion;  Surgeon: Thompson Grayer, MD;  Location: [place] [facility];  Service: Cardiovascular;  Laterality: N/A;  . RIGHT HEART CATHETERIZATION Right 06/06/2014   Procedure: RIGHT HEART CATH;  Surgeon: Jolaine Artist, MD;  Location: [facility];  Service: Cardiovascular;  Laterality: Right;  . UVULOPALATOPHARYNGOPLASTY  1999   Family History  Problem Relation Age of Onset  . Heart disease Mother   . Heart attack Mother   . Hypertension Mother   . Prostate cancer Father   . Colon cancer Neg Hx    Social History   Occupational History  . Retired     Social research officer, government   Social History Main Topics  . Smoking status: Former Smoker    Packs/day:  2.00    Years: 5.00    Types: Cigarettes    Quit date: 09/22/1979  . Smokeless tobacco: Never Used  . Alcohol use 3.6 oz/week    6 Shots of liquor per week     Comment: 12/31/2016 " "2 vodka & tonics on MWF"  . Drug use: No  . Sexual activity: Not Currently   Tobacco Counseling Counseling given: Not Answered   Activities of Daily Living In your present state of health, do you have any difficulty performing the following activities: 03/17/2017 12/31/2016  Hearing? N N  Vision? N N  Difficulty concentrating or making decisions? N N  Walking or climbing stairs? N N  Dressing or bathing? N N  Doing errands, shopping? N N  Preparing Food and eating ? N -  Using the Toilet? N -  In the past six months, have you  accidently leaked urine? N -  Do you have problems with loss of bowel control? N -  Managing your Medications? N -  Managing your Finances? N -  Housekeeping or managing your Housekeeping? N -  Some recent data might be hidden    Immunizations and Health Maintenance Immunization History  Administered Date(s) Administered  . Hepatitis B 12/03/2012, 01/11/2013  . Hepatitis B, ped/adol 06/03/2013  . Influenza Split 08/28/2011, 11/03/2012  . Influenza, High Dose Seasonal PF 09/11/2016  . Influenza,inj,Quad PF,36+ Mos 07/18/2013, 07/25/2014, 09/03/2015  . Pneumococcal Conjugate-13 09/03/2015  . Pneumococcal Polysaccharide-23 08/30/2013  . Tdap 09/02/2013   There are no preventive care reminders to display for this patient.  Patient Care Team: Mosie Lukes, MD as PCP - General (Family Medicine) Elsie Stain, MD as Consulting Physician (Pulmonary Disease) Bensimhon, Shaune Pascal, MD as Consulting Physician (Cardiology) Wyatt Portela, MD as Consulting Physician (Oncology) Milus Banister, MD as Consulting Physician (Gastroenterology) Gerlene Burdock and [facility] (Dentistry)  Indicate any recent Medical Services you may have received from other than [facility]  providers in the past year (date may be approximate).    Assessment:   This is a routine wellness examination for [place]. Physical assessment deferred to PCP.   Hearing/Vision screen  Visual Acuity Screening   Right eye Left eye Both eyes  Without correction:     With correction:   20/20  Comments: Follows w/ the [place]. Wears glasses. No vision issues.  Hearing Screening Comments: Able to hear conversational tones w/o difficulty. No issues reported. Passes whisper test.  Dietary issues and exercise activities discussed: Current Exercise Habits: The patient does not participate in regular exercise at present, Exercise limited by: Other - see comments (Time limited due to taking care of his aging parents.)  Diet (meal preparation, eat out, water intake, caffeinated beverages, dairy products, fruits and vegetables): in general, a "healthy" diet  , well balanced, on average, 2 meals per day. Tries to avoid fried foods. Likes salad and vegetables and tries to eat a variety of food. Drinks water throughout the day.   Goals    . Reduce alcohol intake      Depression Screen PHQ 2/9 Scores 03/17/2017 06/20/2016 04/28/2016 12/04/2015  PHQ - 2 Score 1 0 0 0  Exception Documentation - - Patient refusal -    Fall Risk Fall Risk  03/17/2017 06/20/2016 04/28/2016 12/04/2015 06/15/2015  Falls in the past year? Yes Yes No No No  Number falls in past yr: 1 1 - - -  Injury with Fall? Yes No - - -    Cognitive Function: MMSE - Mini Mental State Exam 03/17/2017  Orientation to time 5  Orientation to Place 5  Registration 3  Attention/ Calculation 4  Recall 3  Language- name 2 objects 2  Language- repeat 1  Language- follow 3 step command 3  Language- read & follow direction 1  Write a sentence 1  Copy design 1  Total score 29        Screening Tests Health Maintenance  Topic Date Due  . INFLUENZA VACCINE  06/03/2017  . PNA vac Low Risk Adult (2 of 2 - PPSV23) 08/30/2018  . TETANUS/TDAP   09/03/2023  . COLONOSCOPY  12/09/2023  . Hepatitis C Screening  Completed        Plan:    Follow-up w/ PCP as directed.  Bring a copy of your advance directives to your next office visit.  I have personally reviewed and  noted the following in the patient's chart:   . Medical and social history . Use of alcohol, tobacco or illicit drugs  . Current medications and supplements . Functional ability and status . Nutritional status . Physical activity . Advanced directives . List of other physicians . Vitals . Screenings to include cognitive, depression, and falls . Referrals and appointments  In addition, I have reviewed and discussed with patient certain preventive protocols, quality metrics, and best practice recommendations. A written personalized care plan for preventive services as well as general preventive health recommendations were provided to patient.     Dorrene German, RN   03/17/2017

## 2017-03-17 ENCOUNTER — Telehealth: Payer: Self-pay | Admitting: *Deleted

## 2017-03-17 ENCOUNTER — Encounter: Payer: Self-pay | Admitting: Family Medicine

## 2017-03-17 ENCOUNTER — Ambulatory Visit (INDEPENDENT_AMBULATORY_CARE_PROVIDER_SITE_OTHER): Payer: Medicare Other | Admitting: Family Medicine

## 2017-03-17 VITALS — BP 120/65 | HR 79 | Temp 98.1°F | Wt 201.4 lb

## 2017-03-17 DIAGNOSIS — D696 Thrombocytopenia, unspecified: Secondary | ICD-10-CM

## 2017-03-17 DIAGNOSIS — M109 Gout, unspecified: Secondary | ICD-10-CM

## 2017-03-17 DIAGNOSIS — F1029 Alcohol dependence with unspecified alcohol-induced disorder: Secondary | ICD-10-CM

## 2017-03-17 DIAGNOSIS — M25522 Pain in left elbow: Secondary | ICD-10-CM

## 2017-03-17 DIAGNOSIS — E038 Other specified hypothyroidism: Secondary | ICD-10-CM

## 2017-03-17 DIAGNOSIS — Z0000 Encounter for general adult medical examination without abnormal findings: Secondary | ICD-10-CM

## 2017-03-17 DIAGNOSIS — E781 Pure hyperglyceridemia: Secondary | ICD-10-CM

## 2017-03-17 DIAGNOSIS — E785 Hyperlipidemia, unspecified: Secondary | ICD-10-CM | POA: Insufficient documentation

## 2017-03-17 HISTORY — DX: Pure hyperglyceridemia: E78.1

## 2017-03-17 LAB — LIPID PANEL
CHOL/HDL RATIO: 3
CHOLESTEROL: 216 mg/dL — AB (ref 0–200)
HDL: 69.2 mg/dL (ref >39.00)
NonHDL: 147.23
Triglycerides: 366 mg/dL — ABNORMAL HIGH (ref 0.0–149.0)
VLDL: 73.2 mg/dL — AB (ref 0.0–40.0)

## 2017-03-17 LAB — COMPREHENSIVE METABOLIC PANEL
ALBUMIN: 4.2 g/dL (ref 3.5–5.2)
ALT: 29 U/L (ref 0–53)
AST: 61 U/L — ABNORMAL HIGH (ref 0–37)
Alkaline Phosphatase: 126 U/L — ABNORMAL HIGH (ref 39–117)
BUN: 28 mg/dL — ABNORMAL HIGH (ref 6–23)
CALCIUM: 9.2 mg/dL (ref 8.4–10.5)
CHLORIDE: 106 meq/L (ref 96–112)
CO2: 22 mEq/L (ref 19–32)
Creatinine, Ser: 1.65 mg/dL — ABNORMAL HIGH (ref 0.40–1.50)
GFR: 53.76 mL/min — AB (ref >60.00)
Glucose, Bld: 101 mg/dL — ABNORMAL HIGH (ref 70–99)
POTASSIUM: 4.6 meq/L (ref 3.5–5.1)
Sodium: 135 mEq/L (ref 135–145)
TOTAL PROTEIN: 7.5 g/dL (ref 6.0–8.3)
Total Bilirubin: 0.8 mg/dL (ref 0.2–1.2)

## 2017-03-17 LAB — CBC
HCT: 38.8 % — ABNORMAL LOW (ref 39.0–52.0)
HEMOGLOBIN: 12.9 g/dL — AB (ref 13.0–17.0)
MCHC: 33.4 g/dL (ref 30.0–36.0)
MCV: 106.6 fl — ABNORMAL HIGH (ref 78.0–100.0)
Platelets: 37 10*3/uL — CL (ref 150.0–400.0)
RBC: 3.64 Mil/uL — AB (ref 4.22–5.81)
RDW: 15.7 % — AB (ref 11.5–15.5)

## 2017-03-17 LAB — LDL CHOLESTEROL, DIRECT: Direct LDL: 78 mg/dL

## 2017-03-17 LAB — TSH: TSH: 2.84 u[IU]/mL (ref 0.35–4.50)

## 2017-03-17 LAB — URIC ACID: URIC ACID, SERUM: 11.4 mg/dL — AB (ref 4.0–7.8)

## 2017-03-17 MED ORDER — ADULT MULTIVITAMIN W/MINERALS CH: 1.0000 | ORAL_TABLET | Freq: Every day | ORAL | Status: DC

## 2017-03-17 MED ORDER — DOXEPIN HCL 10 MG PO CAPS: 10.0000 mg | ORAL_CAPSULE | Freq: Every evening | ORAL | 5 refills | Status: DC | PRN

## 2017-03-17 MED FILL — DOXEPIN 10 MG CAPSULE: 10 | 30 days supply | Qty: 60 | Fill #0

## 2017-03-17 NOTE — Assessment & Plan Note (Signed)
Reports decreased use, but continues to use, encouraged to try cessation

## 2017-03-17 NOTE — Progress Notes (Signed)
Patient ID: Darren Mueller, male   DOB: May 17, 1951, 66 y.o.   MRN: [id number]   Subjective:  I acted as a Education administrator for Penni Homans, [place], [place]   Patient ID: Darren Mueller, male    DOB: [birth year], 66 y.o.   MRN: [id number]  Chief Complaint  Patient presents with  . Follow-up    23-monthF/U.  .Marland KitchenMedicare Wellness Visit    with RN    Arm Pain   The incident occurred more than 1 week ago. The injury mechanism is unknown. The pain is present in the left forearm. Pertinent negatives include no chest pain.    Patient is in today for a 365-monthollow up to be followed up with the Health Coach. Patient states that he has been experiencing left arm pain for the past 2 weeks. Patient has a Hx of alcohol dependency, HTN, PAH, OSA, GERD, gout. Patient also no additional acute concerns noted at this time. He is struggling with being the care provider for his ailing elderly parents. His father has lung cancer and his mother has end-stage diabetic complications. They're actually in the process of getting a hospice consultation for her. He reports he is continuing to drink alcohol but has cutback. He denies recent hospitalization or febrile illness. Continues to follow closely with congestive heart failure clinic no recent change in medications and his bowel frequency has slowed down with some dietary adjustments including increasing fiber in his diet. Denies CP/palp/HA/congestion/fevers/GIor GU c/o. Taking meds as prescribed  Patient Care Team: BlMosie LukesMD as PCP - General (Family Medicine) WrElsie StainMD as Consulting Physician (Pulmonary Disease) Bensimhon, DaShaune PascalMD as Consulting Physician (Cardiology) ShWyatt PortelaMD as Consulting Physician (Oncology) JaMilus BanisterMD as Consulting Physician (Gastroenterology)   Past Medical History:  Diagnosis Date  . Alcohol dependence ([facility] [facility]/19/2012   In remission   . Alcohol dependence in remission ([facility]5/19/2012   In  remission   . Alcoholic cirrhosis of liver with ascites (HCP[place]2014  . Dermatitis 06/10/2015  . Diarrhea 09/04/2013  . Elbow pain, left 03/17/2017  . GERD (gastroesophageal reflux disease)   . Gout   . Heart murmur   . Hx of colonic polyps   . Hypertension   . Hypertriglyceridemia 03/17/2017  . Hypopotassemia   . Otalgia 11/28/2013  . Other chronic pulmonary heart diseases   . Preventative health care 06/29/2016  . Red eye 03/03/2016   right  . Renal insufficiency 07/18/2013  . Seizures ([facility]  . Sleep apnea    does not wear CPAP  . Thrombocytopenia ([facility]8/27/2017  . Unspecified hypothyroidism 01/25/2013  . Unspecified pleural effusion     Past Surgical History:  Procedure Laterality Date  . CARDIAC CATHETERIZATION N/A 12/21/2015   Procedure: Right Heart Cath;  Surgeon: DaJolaine ArtistMD;  [facility];  Service: Cardiovascular;  Laterality: N/A;  . CARDIAC CATHETERIZATION N/A 04/15/2016   Procedure: Right/Left Heart Cath and Coronary Angiography;  Surgeon: DaJolaine ArtistMD;  [facility];  Service: Cardiovascular;  Laterality: N/A;  . COLONOSCOPY N/A 12/08/2013   Procedure: COLONOSCOPY;  Surgeon: DaMilus BanisterMD;  [facility];  Service: Endoscopy;  Laterality: N/A;  . ESOPHAGOGASTRODUODENOSCOPY (EGD) WITH PROPOFOL N/A 03/27/2016   Procedure: ESOPHAGOGASTRODUODENOSCOPY (EGD) WITH PROPOFOL;  Surgeon: DaMilus BanisterMD;  [facility] Service: [facility];  Laterality: N/A;  . LOOP RECORDER INSERTION N/A 01/01/2017   Procedure: Loop  Recorder Insertion;  Surgeon: Thompson Grayer, MD;  Location: [place] [facility];  Service: Cardiovascular;  Laterality: N/A;  . RIGHT HEART CATHETERIZATION Right 06/06/2014   Procedure: RIGHT HEART CATH;  Surgeon: Jolaine Artist, MD;  Location[facility];  Service: Cardiovascular;  Laterality: Right;  . UVULOPALATOPHARYNGOPLASTY  1999    Family History  Problem Relation Age of Onset  . Heart  disease Mother   . Heart attack Mother   . Hypertension Mother   . Prostate cancer Father   . Colon cancer Neg Hx     Social History   Social History  . Marital status: Divorced    Spouse name: N/A  . Number of children: 3  . Years of education: N/A   Occupational History  . Retired     Social research officer, government   Social History Main Topics  . Smoking status: Former Smoker    Packs/day: 2.00    Years: 5.00    Types: Cigarettes    Quit date: 09/22/1979  . Smokeless tobacco: Never Used  . Alcohol use 3.6 oz/week    6 Shots of liquor per week     Comment: 12/31/2016 " "2 vodka & tonics on MWF"  . Drug use: No  . Sexual activity: Not Currently   Other Topics Concern  . Not on file   Social History Narrative   0 caffeine drinks daily     Outpatient Medications Prior to Visit  Medication Sig Dispense Refill  . Albuterol Sulfate (PROAIR RESPICLICK) 754 (90 Base) MCG/ACT AEPB Inhale 2 puffs into the lungs every 6 (six) hours as needed (SOB or wheezing).    . Fluticasone Propionate 0.05 % LOTN Apply daily to affected area 3 Bottle 3  . fluticasone-salmeterol (ADVAIR HFA) 230-21 MCG/ACT inhaler Inhale 2 puffs into the lungs every morning.    . furosemide (LASIX) 40 MG tablet Take 40 mg (1 tab) once on Monday and Friday mornings. 30 tablet 3  . levothyroxine (SYNTHROID, LEVOTHROID) 25 MCG tablet TAKE 1 TABLET DAILY 90 tablet 1  . loperamide (ANTI-DIARRHEAL) 2 MG capsule Take 4 mg by mouth as needed for diarrhea or loose stools (up to 8 capsules per day).     Marland Kitchen losartan (COZAAR) 50 MG tablet Take 50 mg by mouth daily.    . Macitentan (OPSUMIT) 10 MG TABS Take 10 mg by mouth daily. 90 tablet 3  . omeprazole (PRILOSEC) 40 MG capsule TAKE 1 CAPSULE DAILY 90 capsule 2  . potassium chloride (K-DUR,KLOR-CON) 10 MEQ tablet Take 10 mEq by mouth. 2 tablets daily, extra 2 tablets on Monday and Friday    . Selexipag (UPTRAVI) 1000 MCG TABS Take 1,000 mcg by mouth 2 (two) times daily. 180 tablet 3  .  spironolactone (ALDACTONE) 50 MG tablet TAKE 1 TABLET DAILY 90 tablet 1  . SYMAX-SR 0.375 MG 12 hr tablet TAKE 1 TABLET TWICE A DAY 180 tablet 1  . tadalafil, PAH, (ADCIRCA) 20 MG tablet TAKE 2 TABLETS (40 MG TOTAL) DAILY 180 tablet 2  . doxepin (SINEQUAN) 10 MG capsule Take 10-20 mg by mouth at bedtime as needed.    . Multiple Vitamin (MULTIVITAMIN WITH MINERALS) TABS tablet Take 1 tablet by mouth daily.     No facility-administered medications prior to visit.     Allergies  Allergen Reactions  . Aspirin Other (See Comments)    hypertension    Review of Systems  Constitutional: Positive for malaise/fatigue. Negative for fever.  HENT: Negative for congestion.  Eyes: Negative for blurred vision.  Respiratory: Positive for shortness of breath. Negative for cough.   Cardiovascular: Negative for chest pain, palpitations and leg swelling.  Gastrointestinal: Negative for vomiting.  Musculoskeletal: Positive for joint pain. Negative for back pain.       Left arm pain.  Skin: Negative for rash.  Neurological: Negative for loss of consciousness and headaches.       Objective:    Physical Exam  Constitutional: He is oriented to person, place, and time. He appears well-developed and well-nourished. No distress.  HENT:  Head: Normocephalic and atraumatic.  Eyes: Conjunctivae are normal.  Neck: Normal range of motion. No thyromegaly present.  Cardiovascular: Normal rate and regular rhythm.   Pulmonary/Chest: Effort normal and breath sounds normal. He has no wheezes.  Abdominal: Soft. Bowel sounds are normal. There is no tenderness.  Musculoskeletal: He exhibits no edema or deformity.  Neurological: He is alert and oriented to person, place, and time.  Skin: Skin is warm and dry. He is not diaphoretic.  Psychiatric: He has a normal mood and affect.    BP 120/65 (BP Location: Left Arm, Patient Position: Sitting, Cuff Size: Normal)   Pulse 79   Temp 98.1 F (36.7 C) (Oral)   Wt 201  lb 6.4 oz (91.4 kg)   SpO2 99% Comment: RA  BMI 27.31 kg/m  Wt Readings from Last 3 Encounters:  03/17/17 201 lb 6.4 oz (91.4 kg)  03/04/17 200 lb 8 oz (90.9 kg)  01/15/17 200 lb 6.4 oz (90.9 kg)   BP Readings from Last 3 Encounters:  03/17/17 120/65  03/04/17 132/76  01/15/17 (!) 152/76     Immunization History  Administered Date(s) Administered  . Hepatitis B 12/03/2012, 01/11/2013  . Hepatitis B, ped/adol 06/03/2013  . Influenza Split 08/28/2011, 11/03/2012  . Influenza, High Dose Seasonal PF 09/11/2016  . Influenza,inj,Quad PF,36+ Mos 07/18/2013, 07/25/2014, 09/03/2015  . Pneumococcal Conjugate-13 09/03/2015  . Pneumococcal Polysaccharide-23 08/30/2013  . Tdap 09/02/2013    Health Maintenance  Topic Date Due  . INFLUENZA VACCINE  06/03/2017  . PNA vac Low Risk Adult (2 of 2 - PPSV23) 08/30/2018  . TETANUS/TDAP  09/03/2023  . COLONOSCOPY  12/09/2023  . Hepatitis C Screening  Completed    Lab Results  Component Value Date   WBC 2.2 (L) 01/02/2017   HGB 12.5 (L) 01/02/2017   HCT 35.1 (L) 01/02/2017   PLT 40 (L) 01/02/2017   GLUCOSE 95 01/02/2017   CHOL 198 06/20/2016   TRIG 485.0 (H) 06/20/2016   HDL 62.40 06/20/2016   LDLDIRECT 68.0 06/20/2016   LDLCALC 77 12/04/2015   ALT 22 09/11/2016   AST 48 (H) 09/11/2016   NA 136 01/02/2017   K 4.1 01/02/2017   CL 104 01/02/2017   CREATININE 1.46 (H) 01/02/2017   BUN 18 01/02/2017   CO2 24 01/02/2017   TSH 3.47 06/20/2016   PSA 0.49 11/01/2012   INR 1.25 06/04/2016   HGBA1C 5.4 03/02/2015    Lab Results  Component Value Date   TSH 3.47 06/20/2016   Lab Results  Component Value Date   WBC 2.2 (L) 01/02/2017   HGB 12.5 (L) 01/02/2017   HCT 35.1 (L) 01/02/2017   MCV 105.1 (H) 01/02/2017   PLT 40 (L) 01/02/2017   Lab Results  Component Value Date   NA 136 01/02/2017   K 4.1 01/02/2017   CO2 24 01/02/2017   GLUCOSE 95 01/02/2017   BUN 18 01/02/2017   CREATININE 1.46 (H)  01/02/2017   BILITOT 0.9  09/11/2016   ALKPHOS 166 (H) 09/11/2016   AST 48 (H) 09/11/2016   ALT 22 09/11/2016   PROT 7.7 09/11/2016   ALBUMIN 4.1 09/11/2016   CALCIUM 9.4 01/02/2017   ANIONGAP 8 01/02/2017   GFR 67.30 09/11/2016   Lab Results  Component Value Date   CHOL 198 06/20/2016   Lab Results  Component Value Date   HDL 62.40 06/20/2016   Lab Results  Component Value Date   LDLCALC 77 12/04/2015   Lab Results  Component Value Date   TRIG 485.0 (H) 06/20/2016   Lab Results  Component Value Date   CHOLHDL 3 06/20/2016   Lab Results  Component Value Date   HGBA1C 5.4 03/02/2015         Assessment & Plan:   Problem List Items Addressed This Visit    Alcohol dependence ([place])    Reports decreased use, but continues to use, encouraged to try cessation      Relevant Orders   CBC   Comprehensive metabolic panel   Hypothyroidism    On Levothyroxine, continue to monitor      Relevant Orders   TSH   Gout - Primary    Check uric acid today      Relevant Orders   Uric acid   Thrombocytopenia (H[facility])    Check cbc today      Hypertriglyceridemia    Encouraged heart healthy diet, increase exercise, avoid trans fats, consider a krill oil cap daily      Relevant Orders   Lipid panel   Elbow pain, left    Try ice, bracing a topical lidocaine daily refer to sports med      Relevant Orders   Ambulatory referral to Sports Medicine      I have discontinued Mr. Duquan Gillooly. I have also changed his doxepin. Additionally, I am having him maintain his loperamide, Macitentan, Fluticasone Propionate, levothyroxine, losartan, omeprazole, SYMAX-SR, tadalafil (PAH), Albuterol Sulfate, potassium chloride, fluticasone-salmeterol, furosemide, spironolactone, Selexipag, and multivitamin with minerals.  Meds ordered this encounter  Medications  . DISCONTD: UPTRAVI 800 MCG TABS  . Multiple Vitamin (MULTIVITAMIN WITH MINERALS) TABS tablet    Sig: Take 1 tablet by mouth daily.  Marland Kitchen  doxepin (SINEQUAN) 10 MG capsule    Sig: Take 1-2 capsules (10-20 mg total) by mouth at bedtime as needed.    Dispense:  60 capsule    Refill:  5    CMA served as scribe during this visit. History, Physical and Plan performed by medical provider. Documentation and orders reviewed and attested to.  Penni Homans, MD

## 2017-03-17 NOTE — Assessment & Plan Note (Signed)
Try ice, bracing a topical lidocaine daily refer to sports med

## 2017-03-17 NOTE — Assessment & Plan Note (Signed)
Check cbc today

## 2017-03-17 NOTE — Telephone Encounter (Signed)
[facility] lab reporting critical white count @ 1.6 and platelet @ 40

## 2017-03-17 NOTE — Progress Notes (Signed)
Pre visit review using our clinic review tool, if applicable. No additional management support is needed unless otherwise documented below in the visit note. 

## 2017-03-17 NOTE — Assessment & Plan Note (Signed)
Encouraged heart healthy diet, increase exercise, avoid trans fats, consider a krill oil cap daily 

## 2017-03-17 NOTE — Assessment & Plan Note (Signed)
Well controlled, no changes to meds. Encouraged heart healthy diet such as the DASH diet and exercise as tolerated.

## 2017-03-17 NOTE — Telephone Encounter (Signed)
Labs stable for patient asymptomatic, just remind him if he has any bleeding that is difficult to control he should get looked at.

## 2017-03-17 NOTE — Assessment & Plan Note (Signed)
Check uric acid today

## 2017-03-17 NOTE — Patient Instructions (Addendum)
Darren Mueller , Thank you for taking time to come for your Medicare Wellness Visit. I appreciate your ongoing commitment to your health goals. Please review the following plan we discussed and let me know if I can assist you in the future.   Bring a copy of your advance directives to your next office visit.   These are the goals we discussed: Goals    . Reduce alcohol intake       This is a list of the screening recommended for you and due dates:  Health Maintenance  Topic Date Due  . Flu Shot  06/03/2017  . Pneumonia vaccines (2 of 2 - PPSV23) 08/30/2018  . Tetanus Vaccine  09/03/2023  . Colon Cancer Screening  12/09/2023  .  Hepatitis C: One time screening is recommended by Center for Disease Control  (CDC) for  adults born from [birth year] through 1966.   Completed   Hypertension Hypertension, commonly called high blood pressure, is when the force of blood pumping through the arteries is too strong. The arteries are the blood vessels that carry blood from the heart throughout the body. Hypertension forces the heart to work harder to pump blood and may cause arteries to become narrow or stiff. Having untreated or uncontrolled hypertension can cause heart attacks, strokes, kidney disease, and other problems. A blood pressure reading consists of a higher number over a lower number. Ideally, your blood pressure should be below 120/80. The first ("top") number is called the systolic pressure. It is a measure of the pressure in your arteries as your heart beats. The second ("bottom") number is called the diastolic pressure. It is a measure of the pressure in your arteries as the heart relaxes. What are the causes? The cause of this condition is not known. What increases the risk? Some risk factors for high blood pressure are under your control. Others are not. Factors you can change   Smoking.  Having type 2 diabetes mellitus, high cholesterol, or both.  Not getting enough exercise or physical  activity.  Being overweight.  Having too much fat, sugar, calories, or salt (sodium) in your diet.  Drinking too much alcohol. Factors that are difficult or impossible to change   Having chronic kidney disease.  Having a family history of high blood pressure.  Age. Risk increases with age.  Race. You may be at higher risk if you are African-American.  Gender. Men are at higher risk than women before age 15. After age 63, women are at higher risk than men.  Having obstructive sleep apnea.  Stress. What are the signs or symptoms? Extremely high blood pressure (hypertensive crisis) may cause:  Headache.  Anxiety.  Shortness of breath.  Nosebleed.  Nausea and vomiting.  Severe chest pain.  Jerky movements you cannot control (seizures). How is this diagnosed? This condition is diagnosed by measuring your blood pressure while you are seated, with your arm resting on a surface. The cuff of the blood pressure monitor will be placed directly against the skin of your upper arm at the level of your heart. It should be measured at least twice using the same arm. Certain conditions can cause a difference in blood pressure between your right and left arms. Certain factors can cause blood pressure readings to be lower or higher than normal (elevated) for a short period of time:  When your blood pressure is higher when you are in a health care provider's office than when you are at home, this is called  white coat hypertension. Most people with this condition do not need medicines.  When your blood pressure is higher at home than when you are in a health care provider's office, this is called masked hypertension. Most people with this condition may need medicines to control blood pressure. If you have a high blood pressure reading during one visit or you have normal blood pressure with other risk factors:  You may be asked to return on a different day to have your blood pressure checked  again.  You may be asked to monitor your blood pressure at home for 1 week or longer. If you are diagnosed with hypertension, you may have other blood or imaging tests to help your health care provider understand your overall risk for other conditions. How is this treated? This condition is treated by making healthy lifestyle changes, such as eating healthy foods, exercising more, and reducing your alcohol intake. Your health care provider may prescribe medicine if lifestyle changes are not enough to get your blood pressure under control, and if:  Your systolic blood pressure is above 130.  Your diastolic blood pressure is above 80. Your personal target blood pressure may vary depending on your medical conditions, your age, and other factors. Follow these instructions at home: Eating and drinking   Eat a diet that is high in fiber and potassium, and low in sodium, added sugar, and fat. An example eating plan is called the DASH (Dietary Approaches to Stop Hypertension) diet. To eat this way:  Eat plenty of fresh fruits and vegetables. Try to fill half of your plate at each meal with fruits and vegetables.  Eat whole grains, such as whole wheat pasta, brown rice, or whole grain bread. Fill about one quarter of your plate with whole grains.  Eat or drink low-fat dairy products, such as skim milk or low-fat yogurt.  Avoid fatty cuts of meat, processed or cured meats, and poultry with skin. Fill about one quarter of your plate with lean proteins, such as fish, chicken without skin, beans, eggs, and tofu.  Avoid premade and processed foods. These tend to be higher in sodium, added sugar, and fat.  Reduce your daily sodium intake. Most people with hypertension should eat less than 1,500 mg of sodium a day.  Limit alcohol intake to no more than 1 drink a day for nonpregnant women and 2 drinks a day for men. One drink equals 12 oz of beer, 5 oz of wine, or 1 oz of hard liquor. Lifestyle   Work  with your health care provider to maintain a healthy body weight or to lose weight. Ask what an ideal weight is for you.  Get at least 30 minutes of exercise that causes your heart to beat faster (aerobic exercise) most days of the week. Activities may include walking, swimming, or biking.  Include exercise to strengthen your muscles (resistance exercise), such as pilates or lifting weights, as part of your weekly exercise routine. Try to do these types of exercises for 30 minutes at least 3 days a week.  Do not use any products that contain nicotine or tobacco, such as cigarettes and e-cigarettes. If you need help quitting, ask your health care provider.  Monitor your blood pressure at home as told by your health care provider.  Keep all follow-up visits as told by your health care provider. This is important. Medicines   Take over-the-counter and prescription medicines only as told by your health care provider. Follow directions carefully. Blood pressure  medicines must be taken as prescribed.  Do not skip doses of blood pressure medicine. Doing this puts you at risk for problems and can make the medicine less effective.  Ask your health care provider about side effects or reactions to medicines that you should watch for. Contact a health care provider if:  You think you are having a reaction to a medicine you are taking.  You have headaches that keep coming back (recurring).  You feel dizzy.  You have swelling in your ankles.  You have trouble with your vision. Get help right away if:  You develop a severe headache or confusion.  You have unusual weakness or numbness.  You feel faint.  You have severe pain in your chest or abdomen.  You vomit repeatedly.  You have trouble breathing. Summary  Hypertension is when the force of blood pumping through your arteries is too strong. If this condition is not controlled, it may put you at risk for serious complications.  Your  personal target blood pressure may vary depending on your medical conditions, your age, and other factors. For most people, a normal blood pressure is less than 120/80.  Hypertension is treated with lifestyle changes, medicines, or a combination of both. Lifestyle changes include weight loss, eating a healthy, low-sodium diet, exercising more, and limiting alcohol. This information is not intended to replace advice given to you by your health care provider. Make sure you discuss any questions you have with your health care provider. Document Released: 10/20/2005 Document Revised: 09/17/2016 Document Reviewed: 09/17/2016 Elsevier Interactive Patient Education  2017 [employer] 65 Years and Older, Male Preventive care refers to lifestyle choices and visits with your health care provider that can promote health and wellness. What does preventive care include?  A yearly physical exam. This is also called an annual well check.  Dental exams once or twice a year.  Routine eye exams. Ask your health care provider how often you should have your eyes checked.  Personal lifestyle choices, including:  Daily care of your teeth and gums.  Regular physical activity.  Eating a healthy diet.  Avoiding tobacco and drug use.  Limiting alcohol use.  Practicing safe sex.  Taking low doses of aspirin every day.  Taking vitamin and mineral supplements as recommended by your health care provider. What happens during an annual well check? The services and screenings done by your health care provider during your annual well check will depend on your age, overall health, lifestyle risk factors, and family history of disease. Counseling  Your health care provider may ask you questions about your:  Alcohol use.  Tobacco use.  Drug use.  Emotional well-being.  Home and relationship well-being.  Sexual activity.  Eating habits.  History of falls.  Memory and ability to  understand (cognition).  Work and work Statistician. Screening  You may have the following tests or measurements:  Height, weight, and BMI.  Blood pressure.  Lipid and cholesterol levels. These may be checked every 5 years, or more frequently if you are over 58 years old.  Skin check.  Lung cancer screening. You may have this screening every year starting at age 88 if you have a 30-pack-year history of smoking and currently smoke or have quit within the past 15 years.  Fecal occult blood test (FOBT) of the stool. You may have this test every year starting at age 97.  Flexible sigmoidoscopy or colonoscopy. You may have a sigmoidoscopy every 5 years or  a colonoscopy every 10 years starting at age 28.  Prostate cancer screening. Recommendations will vary depending on your family history and other risks.  Hepatitis C blood test.  Hepatitis B blood test.  Sexually transmitted disease (STD) testing.  Diabetes screening. This is done by checking your blood sugar (glucose) after you have not eaten for a while (fasting). You may have this done every 1-3 years.  Abdominal aortic aneurysm (AAA) screening. You may need this if you are a current or former smoker.  Osteoporosis. You may be screened starting at age 71 if you are at high risk. Talk with your health care provider about your test results, treatment options, and if necessary, the need for more tests. Vaccines  Your health care provider may recommend certain vaccines, such as:  Influenza vaccine. This is recommended every year.  Tetanus, diphtheria, and acellular pertussis (Tdap, Td) vaccine. You may need a Td booster every 10 years.  Varicella vaccine. You may need this if you have not been vaccinated.  Zoster vaccine. You may need this after age 85.  Measles, mumps, and rubella (MMR) vaccine. You may need at least one dose of MMR if you were born in 1957 or later. You may also need a second dose.  Pneumococcal 13-valent  conjugate (PCV13) vaccine. One dose is recommended after age 12.  Pneumococcal polysaccharide (PPSV23) vaccine. One dose is recommended after age 6.  Meningococcal vaccine. You may need this if you have certain conditions.  Hepatitis A vaccine. You may need this if you have certain conditions or if you travel or work in places where you may be exposed to hepatitis A.  Hepatitis B vaccine. You may need this if you have certain conditions or if you travel or work in places where you may be exposed to hepatitis B.  Haemophilus influenzae type b (Hib) vaccine. You may need this if you have certain risk factors. Talk to your health care provider about which screenings and vaccines you need and how often you need them. This information is not intended to replace advice given to you by your health care provider. Make sure you discuss any questions you have with your health care provider. Document Released: 11/16/2015 Document Revised: 07/09/2016 Document Reviewed: 08/21/2015 Elsevier Interactive Patient Education  2017 [employer].

## 2017-03-17 NOTE — Assessment & Plan Note (Signed)
On Levothyroxine, continue to monitor 

## 2017-03-19 ENCOUNTER — Other Ambulatory Visit: Payer: Self-pay | Admitting: Family Medicine

## 2017-03-19 DIAGNOSIS — N289 Disorder of kidney and ureter, unspecified: Secondary | ICD-10-CM

## 2017-03-19 DIAGNOSIS — R945 Abnormal results of liver function studies: Secondary | ICD-10-CM

## 2017-03-19 MED ORDER — ROSUVASTATIN CALCIUM 5 MG PO TABS: 5.0000 mg | ORAL_TABLET | Freq: Every day | ORAL | 3 refills | Status: DC

## 2017-03-25 ENCOUNTER — Other Ambulatory Visit: Payer: Self-pay

## 2017-03-25 MED ORDER — DOXEPIN HCL 10 MG PO CAPS: 10.0000 mg | ORAL_CAPSULE | Freq: Every evening | ORAL | 5 refills | Status: DC | PRN

## 2017-04-01 ENCOUNTER — Ambulatory Visit (INDEPENDENT_AMBULATORY_CARE_PROVIDER_SITE_OTHER): Payer: Medicare Other | Admitting: *Deleted

## 2017-04-01 DIAGNOSIS — R55 Syncope and collapse: Secondary | ICD-10-CM | POA: Diagnosis not present

## 2017-04-02 NOTE — Progress Notes (Signed)
[facility] Summary Report

## 2017-04-03 LAB — CUP PACEART REMOTE DEVICE CHECK
Date Time Interrogation Session: 20180530154138
MDC IDC PG IMPLANT DT: 20180301

## 2017-04-08 ENCOUNTER — Other Ambulatory Visit (HOSPITAL_COMMUNITY): Payer: Self-pay | Admitting: *Deleted

## 2017-04-08 MED ORDER — TADALAFIL (PAH) 20 MG PO TABS: ORAL_TABLET | ORAL | 2 refills | Status: DC

## 2017-04-08 MED FILL — ADCIRCA 20 MG TABLET: 20 | 30 days supply | Qty: 60 | Fill #0

## 2017-04-14 NOTE — Progress Notes (Signed)
[facility] [address], [place] [address] Phone: [phone] Subjective:    I'm seeing this patient by the request  of:  Mosie Lukes, MD   CC: Left elbow pain  [id number]  Darren Mueller is a 66 y.o. male coming in with complaint of left elbow pain. Past medical history significant for gout. Patient states That her knee for greater than 3 weeks. Patient has pain as a dull aching sensation with a sharp pain with certain movements. Seems to be more on the medial aspect. Hurts more with flexion. Maybe some mild weakness. Denies any neck pain associated with it, denies any numbness.  Past Medical History:  Diagnosis Date  . Alcohol dependence ([place]) 03/22/2011   In remission   . Alcohol dependence in remission ([place]) 03/22/2011   In remission   . Alcoholic cirrhosis of liver with ascites ([place]) 2014  . Dermatitis 06/10/2015  . Diarrhea 09/04/2013  . Elbow pain, left 03/17/2017  . GERD (gastroesophageal reflux disease)   . Gout   . Heart murmur   . Hx of colonic polyps   . Hypertension   . Hypertriglyceridemia 03/17/2017  . Hypopotassemia   . Otalgia 11/28/2013  . Other chronic pulmonary heart diseases   . Preventative health care 06/29/2016  . Red eye 03/03/2016   right  . Renal insufficiency 07/18/2013  . Seizures ([place])   . Sleep apnea    does not wear CPAP  . Thrombocytopenia ([place]) 06/29/2016  . Unspecified hypothyroidism 01/25/2013  . Unspecified pleural effusion    Past Surgical History:  Procedure Laterality Date  . CARDIAC CATHETERIZATION N/A 12/21/2015   Procedure: Right Heart Cath;  Surgeon: Jolaine Artist, MD;  Location: [facility];  Service: Cardiovascular;  Laterality: N/A;  . CARDIAC CATHETERIZATION N/A 04/15/2016   Procedure: Right/Left Heart Cath and Coronary Angiography;  Surgeon: Jolaine Artist, MD;  Location: [facility];  Service: Cardiovascular;  Laterality: N/A;  . COLONOSCOPY N/A 12/08/2013   Procedure: COLONOSCOPY;  Surgeon: Milus Banister, MD;  Location: [facility];  Service: Endoscopy;  Laterality: N/A;  . ESOPHAGOGASTRODUODENOSCOPY (EGD) WITH PROPOFOL N/A 03/27/2016   Procedure: ESOPHAGOGASTRODUODENOSCOPY (EGD) WITH PROPOFOL;  Surgeon: Milus Banister, MD;  Location: [facility];  Service: Endoscopy;  Laterality: N/A;  . LOOP RECORDER INSERTION N/A 01/01/2017   Procedure: Loop Recorder Insertion;  Surgeon: Thompson Grayer, MD;  Location: [facility];  Service: Cardiovascular;  Laterality: N/A;  . RIGHT HEART CATHETERIZATION Right 06/06/2014   Procedure: RIGHT HEART CATH;  Surgeon: Jolaine Artist, MD;  Location: [facility];  Service: Cardiovascular;  Laterality: Right;  . UVULOPALATOPHARYNGOPLASTY  1999   Social History   Social History  . Marital status: Divorced    Spouse name: N/A  . Number of children: 3  . Years of education: N/A   Occupational History  . Retired     Social research officer, government   Social History Main Topics  . Smoking status: Former Smoker    Packs/day: 2.00    Years: 5.00    Types: Cigarettes    Quit date: 09/22/1979  . Smokeless tobacco: Never Used  . Alcohol use 3.6 oz/week    6 Shots of liquor per week     Comment: 12/31/2016 " "2 vodka & tonics on MWF"  . Drug use: No  . Sexual activity: Not Currently   Other Topics Concern  . None   Social History Narrative   0 caffeine  drinks daily    Allergies  Allergen Reactions  . Aspirin Other (See Comments)    hypertension   Family History  Problem Relation Age of Onset  . Heart disease Mother   . Heart attack Mother   . Hypertension Mother   . Prostate cancer Father   . Colon cancer Neg Hx     Past medical history, social, surgical and family history all reviewed in electronic medical record.  No pertanent information unless stated regarding to the chief complaint.   Review of Systems:Review of systems updated and as accurate as of 04/15/17  No headache, visual changes, nausea, vomiting,  diarrhea, constipation, dizziness, abdominal pain, skin rash, fevers, chills, night sweats, weight loss, swollen lymph nodes, body aches, joint swelling, muscle aches, chest pain, shortness of breath, mood changes.   Objective  Blood pressure 128/80, pulse 82, height 6' (1.829 m), weight 201 lb (91.2 kg), SpO2 99 %. Systems examined below as of 04/15/17   General: No apparent distress alert and oriented x3 mood and affect normal, dressed appropriately.  HEENT: Pupils equal, extraocular movements intact  Respiratory: Patient's speak in full sentences and does not appear short of breath  Cardiovascular: No lower extremity edema, non tender, no erythema  Skin: Warm dry intact with no signs of infection or rash on extremities or on axial skeleton.  Abdomen: Soft nontender  Neuro: Cranial nerves II through XII are intact, neurovascularly intact in all extremities with 2+ DTRs and 2+ pulses.  Lymph: No lymphadenopathy of posterior or anterior cervical chain or axillae bilaterally.  Gait normal with good balance and coordination.  MSK:  Non tender with full range of motion and good stability and symmetric strength and tone of shoulders, wrist, hip, knee and ankles bilaterally.  Elbow: Left Unremarkable to inspection. Decreased range of motion noted. Patient is lacking the last 10 of flexion as well as the last 5 of extension. Patient is also lacking the last 5 of supination. Full pronation noted. Stable to varus, valgus stress. Negative moving valgus stress test. No discrete areas of tenderness to palpation. Patient though does have significant worsening pain over the ulnar nerve with full flexion. Ulnar nerve does not sublux. Negative cubital tunnel Tinel's.  Musculoskeletal ultrasound was performed and interpreted by Charlann Boxer D.O.   Elbow:  Lateral epicondyle and common extensor tendon origin visualized.  No edema, effusions, or avulsions seen.  Radial head unremarkable and located in  annular ligament Medial epicondyle and common flexor tendon origin visualized.  No edema, effusions, or avulsions seen. Ulnar nerve in cubital tunnel has significant hypoechoic changes and increasing diameter Olecranon and triceps insertion visualized and unremarkable without edema, effusion, or avulsion.  No signs olecranon bursitis. Power doppler signal normal.  IMPRESSION:  Questionable ulnar subluxation otherwise fairly unremarkable  Procedure note 48016; 15 minutes spent for Therapeutic exercises as stated in above notes.  This included exercises focusing on stretching, strengthening, with significant focus on eccentric aspects. Flexion and extension exercises working on pronation and supination with eccentric exercises.   Proper technique shown and discussed handout in great detail with ATC.  All questions were discussed and answered.     Impression and Recommendations:     This case required medical decision making of moderate complexity.      Note: This dictation was prepared with Dragon dictation along with smaller phrase technology. Any transcriptional errors that result from this process are unintentional.

## 2017-04-15 ENCOUNTER — Ambulatory Visit (INDEPENDENT_AMBULATORY_CARE_PROVIDER_SITE_OTHER)
Admission: RE | Admit: 2017-04-15 | Discharge: 2017-04-15 | Disposition: A | Discharge status: Home / Self Care | Payer: Medicare Other | Source: Ambulatory Visit | Attending: Family Medicine | Admitting: Family Medicine

## 2017-04-15 ENCOUNTER — Encounter: Payer: Self-pay | Admitting: Family Medicine

## 2017-04-15 ENCOUNTER — Ambulatory Visit (INDEPENDENT_AMBULATORY_CARE_PROVIDER_SITE_OTHER): Payer: Medicare Other | Admitting: Family Medicine

## 2017-04-15 ENCOUNTER — Ambulatory Visit: Payer: Self-pay

## 2017-04-15 VITALS — BP 128/80 | HR 82 | Ht 72.0 in | Wt 201.0 lb

## 2017-04-15 DIAGNOSIS — M25522 Pain in left elbow: Secondary | ICD-10-CM | POA: Diagnosis not present

## 2017-04-15 DIAGNOSIS — M25622 Stiffness of left elbow, not elsewhere classified: Secondary | ICD-10-CM | POA: Diagnosis not present

## 2017-04-15 NOTE — Patient Instructions (Addendum)
Good to see you.  I think you have ulnar subluxation and bicep tendonitis.  Xrays downstairs.  Ice 20 minutes 2 times daily. Usually after activity and before bed. pennsaid pinkie amount topically 2 times daily as needed.  Try to straighten elbow as much as you can.  Avoid heavy lifting.  Exercises 3 times a week.  Elbow compression sleeve would be great daily  See me again in 3-4 weeks and lets make sure you are doing well.

## 2017-04-15 NOTE — Assessment & Plan Note (Signed)
Patient does have what appears to be significant decrease in range of motion. Patient is lacking the last 10 of flexion of the elbow as well as last 2 of extension. On ultrasound it was significant injured articular pathology noted. X-rays are pending. Home exercises given for more of the bicep tendinitis as well as a peroneal subluxation. Given trial topical anti-inflammatories and we'll avoid oral anti-inflammatories secondary to patient's other comorbidities. We discussed icing regimen. Discuss compression. Follow-up again in 3-4 weeks.

## 2017-04-21 ENCOUNTER — Telehealth: Payer: Self-pay | Admitting: Oncology

## 2017-04-21 ENCOUNTER — Ambulatory Visit (HOSPITAL_BASED_OUTPATIENT_CLINIC_OR_DEPARTMENT_OTHER): Payer: Medicare Other | Admitting: Oncology

## 2017-04-21 ENCOUNTER — Other Ambulatory Visit (HOSPITAL_BASED_OUTPATIENT_CLINIC_OR_DEPARTMENT_OTHER): Payer: Medicare Other

## 2017-04-21 VITALS — BP 116/75 | HR 83 | Temp 97.7°F | Resp 17 | Ht 72.0 in | Wt 202.4 lb

## 2017-04-21 DIAGNOSIS — D696 Thrombocytopenia, unspecified: Secondary | ICD-10-CM | POA: Diagnosis not present

## 2017-04-21 DIAGNOSIS — F1020 Alcohol dependence, uncomplicated: Secondary | ICD-10-CM

## 2017-04-21 DIAGNOSIS — K7030 Alcoholic cirrhosis of liver without ascites: Secondary | ICD-10-CM

## 2017-04-21 DIAGNOSIS — D72819 Decreased white blood cell count, unspecified: Secondary | ICD-10-CM | POA: Diagnosis not present

## 2017-04-21 DIAGNOSIS — D61818 Other pancytopenia: Secondary | ICD-10-CM

## 2017-04-21 LAB — CBC WITH DIFFERENTIAL/PLATELET
BASO%: 0.3 % (ref 0.0–2.0)
BASOS ABS: 0 10*3/uL (ref 0.0–0.1)
EOS ABS: 0 10*3/uL (ref 0.0–0.5)
EOS%: 2 % (ref 0.0–7.0)
HCT: 37.4 % — ABNORMAL LOW (ref 38.4–49.9)
HEMOGLOBIN: 12.7 g/dL — AB (ref 13.0–17.1)
LYMPH%: 39.4 % (ref 14.0–49.0)
MCH: 36.1 pg — AB (ref 27.2–33.4)
MCHC: 34 g/dL (ref 32.0–36.0)
MCV: 106.4 fL — AB (ref 79.3–98.0)
MONO#: 0.2 10*3/uL (ref 0.1–0.9)
MONO%: 14.3 % — AB (ref 0.0–14.0)
NEUT#: 0.7 10*3/uL — ABNORMAL LOW (ref 1.5–6.5)
NEUT%: 44 % (ref 39.0–75.0)
Platelets: 39 10*3/uL — ABNORMAL LOW (ref 140–400)
RBC: 3.51 10*6/uL — ABNORMAL LOW (ref 4.20–5.82)
RDW: 14.7 % — AB (ref 11.0–14.6)
WBC: 1.6 10*3/uL — ABNORMAL LOW (ref 4.0–10.3)
lymph#: 0.6 10*3/uL — ABNORMAL LOW (ref 0.9–3.3)

## 2017-04-21 NOTE — Progress Notes (Signed)
Hematology and Oncology Follow Up Visit  Darren Mueller [id number] 1951-04-08 66 y.o. 04/21/2017 8:53 AM Mosie Lukes, MDBlyth, Bonnita Levan, MD   Principle Diagnosis: 66 year old gentleman with thrombocytopenia and a leukocytopenia related due to cirrhosis of the liver and splenic sequestration. These findings have been documented dating back to at least 2012. His baseline white cell count is 1.6 with a platelet count of 35,000.  Current therapy: Observation and surveillance.  Interim History:  Mr. Bracco presents today for a follow-up visit. Since his last visit, he reports doing well without any complaints. He denied any hematochezia, melena,  hemoptysis or epistaxis. He denied any recurrent infections or hospitalizations. He denied any altered mental status or early satiety. His appetite remained reasonable and his weight is stable. He remains reasonably active and attends to activities of daily living. He volunteers regularly in church and able to perform all activities of daily living.  He does not report any headaches, blurry vision, syncope or seizures. He does not report any fevers, chills or sweats. He does not report any cough, wheezing or hemoptysis. He does not report any chest pain, palpitation or orthopnea. Does not report any nausea, vomiting, abdominal pain, or early satiety. He does not report any constipation or diarrhea. He does not report any frequency urgency or hesitancy. He does not report any skeletal complaints. Remaining review of systems unremarkable.   Medications: I have reviewed the patient's current medications.  Current Outpatient Prescriptions  Medication Sig Dispense Refill  . Albuterol Sulfate (PROAIR RESPICLICK) 412 (90 Base) MCG/ACT AEPB Inhale 2 puffs into the lungs every 6 (six) hours as needed (SOB or wheezing).    Marland Kitchen doxepin (SINEQUAN) 10 MG capsule Take 1-2 capsules (10-20 mg total) by mouth at bedtime as needed. 60 capsule 5  . Fluticasone Propionate 0.05  % LOTN Apply daily to affected area 3 Bottle 3  . fluticasone-salmeterol (ADVAIR HFA) 230-21 MCG/ACT inhaler Inhale 2 puffs into the lungs every morning.    . furosemide (LASIX) 40 MG tablet Take 40 mg (1 tab) once on Monday and Friday mornings. 30 tablet 3  . levothyroxine (SYNTHROID, LEVOTHROID) 25 MCG tablet TAKE 1 TABLET DAILY 90 tablet 1  . loperamide (ANTI-DIARRHEAL) 2 MG capsule Take 4 mg by mouth as needed for diarrhea or loose stools (up to 8 capsules per day).     Marland Kitchen losartan (COZAAR) 50 MG tablet Take 50 mg by mouth daily.    . Macitentan (OPSUMIT) 10 MG TABS Take 10 mg by mouth daily. 90 tablet 3  . Multiple Vitamin (MULTIVITAMIN WITH MINERALS) TABS tablet Take 1 tablet by mouth daily.    Marland Kitchen omeprazole (PRILOSEC) 40 MG capsule TAKE 1 CAPSULE DAILY 90 capsule 2  . potassium chloride (K-DUR,KLOR-CON) 10 MEQ tablet Take 10 mEq by mouth. 2 tablets daily, extra 2 tablets on Monday and Friday    . rosuvastatin (CRESTOR) 5 MG tablet Take 1 tablet (5 mg total) by mouth daily. 30 tablet 3  . Selexipag (UPTRAVI) 1000 MCG TABS Take 1,000 mcg by mouth 2 (two) times daily. 180 tablet 3  . spironolactone (ALDACTONE) 50 MG tablet TAKE 1 TABLET DAILY 90 tablet 1  . SYMAX-SR 0.375 MG 12 hr tablet TAKE 1 TABLET TWICE A DAY 180 tablet 1  . tadalafil, PAH, (ADCIRCA) 20 MG tablet TAKE 2 TABLETS (40 MG TOTAL) DAILY 180 tablet 2   No current facility-administered medications for this visit.      Allergies:  Allergies  Allergen Reactions  . Aspirin  Other (See Comments)    hypertension    Past Medical History, Surgical history, Social history, and Family History were reviewed and updated.   Physical Exam: Blood pressure 116/75, pulse 83, temperature 97.7 F (36.5 C), temperature source Oral, resp. rate 17, height 6' (1.829 m), weight 202 lb 6.4 oz (91.8 kg), SpO2 97 %. ECOG: 1 General appearance: Alert, awake gentleman without distress. Head: Normocephalic, without obvious abnormality. No oral  lesions or bleeding. Neck: no adenopathy masses. Lymph nodes: Cervical, supraclavicular, and axillary nodes normal. Heart:regular rate and rhythm, S1, S2 normal, no murmur, click, rub or gallop Lung:chest clear, no wheezing, rales, normal symmetric air entry.  Abdomin: soft, non-tender, without masses or organomegaly no shifting dullness or ascites. EXT:no erythema, induration, or nodules.    Lab Results: Lab Results  Component Value Date   WBC 1.6 (L) 04/21/2017   HGB 12.7 (L) 04/21/2017   HCT 37.4 (L) 04/21/2017   MCV 106.4 (H) 04/21/2017   PLT 39 (L) 04/21/2017     Chemistry      Component Value Date/Time   NA 135 03/17/2017 0909   NA 140 06/15/2015 1414   K 4.6 03/17/2017 0909   K 3.6 06/15/2015 1414   CL 106 03/17/2017 0909   CL 106 06/15/2015 1414   CO2 22 03/17/2017 0909   CO2 17 (L) 06/15/2015 1414   BUN 28 (H) 03/17/2017 0909   BUN 18 06/15/2015 1414   CREATININE 1.65 (H) 03/17/2017 0909   CREATININE 1.5 (H) 06/15/2015 1414      Component Value Date/Time   CALCIUM 9.2 03/17/2017 0909   CALCIUM 9.5 06/15/2015 1414   ALKPHOS 126 (H) 03/17/2017 0909   ALKPHOS 145 (H) 06/15/2015 1414   AST 61 (H) 03/17/2017 0909   AST 68 (H) 06/15/2015 1414   ALT 29 03/17/2017 0909   ALT 35 06/15/2015 1414   BILITOT 0.8 03/17/2017 0909   BILITOT 1.40 06/15/2015 1414       Impression and Plan:  66 year old gentleman with the following issues:  1. Thrombocytopenia and leukocytopenia: These findings have been documented since 2012 attributed to cirrhosis of the liver and splenic sequestration.  His counts reviewed again today and are consistent with his previous cancer the last few years. He has no symptoms to suggest active bleeding or recurrent infections.  He does not require any intervention or growth factor support. If he develops symptomatic bleeding and transfusion or platelet stimulating factors could be used.   If he is to require any surgery in the future, his  platelet counts these to be improved above 50 for safety precautions.  2. Cirrhosis of the liver: Related to his alcohol consumption which I have counseled him about drinking cessation at this time. He continues to drink however although he claims he has cut down significantly.  3. Follow-up: Will be in 6 months sooner if needed to.    [facility] [facility], MD 6/19/20188:53 AM

## 2017-04-21 NOTE — Telephone Encounter (Signed)
Scheduled appt per 6/19 los - Gave patient AVS and calender per LOS .

## 2017-04-29 ENCOUNTER — Encounter: Payer: Self-pay | Admitting: Internal Medicine

## 2017-04-29 ENCOUNTER — Other Ambulatory Visit: Payer: Self-pay | Admitting: Emergency Medicine

## 2017-04-29 ENCOUNTER — Ambulatory Visit (INDEPENDENT_AMBULATORY_CARE_PROVIDER_SITE_OTHER): Payer: Medicare Other | Admitting: Internal Medicine

## 2017-04-29 ENCOUNTER — Telehealth: Payer: Self-pay | Admitting: Family Medicine

## 2017-04-29 VITALS — BP 118/78 | HR 92 | Ht 72.0 in | Wt 201.6 lb

## 2017-04-29 DIAGNOSIS — R55 Syncope and collapse: Secondary | ICD-10-CM | POA: Diagnosis not present

## 2017-04-29 LAB — CUP PACEART INCLINIC DEVICE CHECK
Date Time Interrogation Session: 20180627114257
Implantable Pulse Generator Implant Date: 20180301

## 2017-04-29 MED ORDER — ALLOPURINOL 100 MG PO TABS: 100.0000 mg | ORAL_TABLET | Freq: Every day | ORAL | 1 refills | Status: DC

## 2017-04-29 MED FILL — ALLOPURINOL 100 MG TABLET: 100 | 90 days supply | Qty: 90 | Fill #0

## 2017-04-29 NOTE — Telephone Encounter (Signed)
It looks like pt has taken allopurinol 100 mg daily in years past, last rx in 2016. His creat clearance is fine, over 80 Will refill his allopurinol as requested

## 2017-04-29 NOTE — Telephone Encounter (Signed)
Not currently on pt's med list. Please advise.

## 2017-04-29 NOTE — Telephone Encounter (Signed)
Relation to WG:YKZL Call back number: Pharmacy:  Reason for call:  Patient experiencing gout flare up requesting allopurinol (ZYLOPRIM) 100 MG tablet, patient states PCP advised whenever flare up he could call in and request refill, please advise

## 2017-04-29 NOTE — Progress Notes (Signed)
PCP: Mosie Lukes, MD Primary Cardiologist: Dr Truett Mainland Darren Mueller is a 66 y.o. male who presents today for routine electrophysiology followup.  Since last being seen in our clinic, the patient reports doing very well.  Today, he denies symptoms of palpitations, chest pain, shortness of breath,  lower extremity edema, dizziness, presyncope, or syncope.  The patient is otherwise without complaint today.   Past Medical History:  Diagnosis Date  . Alcohol dependence ([place]) 03/22/2011   In remission   . Alcohol dependence in remission ([place]) 03/22/2011   In remission   . Alcoholic cirrhosis of liver with ascites ([place]) 2014  . Dermatitis 06/10/2015  . Diarrhea 09/04/2013  . Elbow pain, left 03/17/2017  . GERD (gastroesophageal reflux disease)   . Gout   . Heart murmur   . Hx of colonic polyps   . Hypertension   . Hypertriglyceridemia 03/17/2017  . Hypopotassemia   . Otalgia 11/28/2013  . Other chronic pulmonary heart diseases   . Preventative health care 06/29/2016  . Red eye 03/03/2016   right  . Renal insufficiency 07/18/2013  . Seizures ([place])   . Sleep apnea    does not wear CPAP  . Thrombocytopenia ([place]) 06/29/2016  . Unspecified hypothyroidism 01/25/2013  . Unspecified pleural effusion    Past Surgical History:  Procedure Laterality Date  . CARDIAC CATHETERIZATION N/A 12/21/2015   Procedure: Right Heart Cath;  Surgeon: Jolaine Artist, MD;  Location: [facility];  Service: Cardiovascular;  Laterality: N/A;  . CARDIAC CATHETERIZATION N/A 04/15/2016   Procedure: Right/Left Heart Cath and Coronary Angiography;  Surgeon: Jolaine Artist, MD;  Location: [facility];  Service: Cardiovascular;  Laterality: N/A;  . COLONOSCOPY N/A 12/08/2013   Procedure: COLONOSCOPY;  Surgeon: Milus Banister, MD;  Location: [facility];  Service: Endoscopy;  Laterality: N/A;  . ESOPHAGOGASTRODUODENOSCOPY (EGD) WITH PROPOFOL N/A 03/27/2016   Procedure: ESOPHAGOGASTRODUODENOSCOPY  (EGD) WITH PROPOFOL;  Surgeon: Milus Banister, MD;  Location: [facility];  Service: Endoscopy;  Laterality: N/A;  . LOOP RECORDER INSERTION N/A 01/01/2017   Procedure: Loop Recorder Insertion;  Surgeon: Thompson Grayer, MD;  Location: [place];  Service: Cardiovascular;  Laterality: N/A;  . RIGHT HEART CATHETERIZATION Right 06/06/2014   Procedure: RIGHT HEART CATH;  Surgeon: Jolaine Artist, MD;  Location: [facility];  Service: Cardiovascular;  Laterality: Right;  . UVULOPALATOPHARYNGOPLASTY  1999    ROS- all systems are reviewed and negatives except as per HPI above  Current Outpatient Prescriptions  Medication Sig Dispense Refill  . Albuterol Sulfate (PROAIR RESPICLICK) 856 (90 Base) MCG/ACT AEPB Inhale 2 puffs into the lungs every 6 (six) hours as needed (SOB or wheezing).    Marland Kitchen doxepin (SINEQUAN) 10 MG capsule Take 1-2 capsules (10-20 mg total) by mouth at bedtime as needed. 60 capsule 5  . Fluticasone Propionate 0.05 % LOTN Apply daily to affected area 3 Bottle 3  . fluticasone-salmeterol (ADVAIR HFA) 230-21 MCG/ACT inhaler Inhale 2 puffs into the lungs every morning.    . furosemide (LASIX) 40 MG tablet Take 40 mg (1 tab) once on Monday and Friday mornings. 30 tablet 3  . levothyroxine (SYNTHROID, LEVOTHROID) 25 MCG tablet TAKE 1 TABLET DAILY 90 tablet 1  . loperamide (ANTI-DIARRHEAL) 2 MG capsule Take 4 mg by mouth as needed for diarrhea or loose stools (up to 8 capsules per day).     Marland Kitchen losartan (COZAAR) 50 MG tablet Take 50 mg by mouth daily.    Marland Kitchen  Macitentan (OPSUMIT) 10 MG TABS Take 10 mg by mouth daily. 90 tablet 3  . Multiple Vitamin (MULTIVITAMIN WITH MINERALS) TABS tablet Take 1 tablet by mouth daily.    Marland Kitchen omeprazole (PRILOSEC) 40 MG capsule TAKE 1 CAPSULE DAILY 90 capsule 2  . potassium chloride (K-DUR,KLOR-CON) 10 MEQ tablet Take 10 mEq by mouth. 2 tablets daily, extra 2 tablets on Monday and Friday    . rosuvastatin (CRESTOR) 5 MG tablet Take 1 tablet (5 mg total) by  mouth daily. 30 tablet 3  . Selexipag (UPTRAVI) 1000 MCG TABS Take 1,000 mcg by mouth 2 (two) times daily. 180 tablet 3  . spironolactone (ALDACTONE) 50 MG tablet TAKE 1 TABLET DAILY 90 tablet 1  . SYMAX-SR 0.375 MG 12 hr tablet TAKE 1 TABLET TWICE A DAY 180 tablet 1  . tadalafil, PAH, (ADCIRCA) 20 MG tablet TAKE 2 TABLETS (40 MG TOTAL) DAILY 180 tablet 2   No current facility-administered medications for this visit.     Physical Exam: Vitals:   04/29/17 1059  BP: 118/78  Pulse: 92  SpO2: 96%  Weight: 201 lb 9.6 oz (91.4 kg)  Height: 6' (1.829 m)    GEN- The patient is well appearing, alert and oriented x 3 today.   Head- normocephalic, atraumatic Eyes-  Sclera clear, conjunctiva pink Ears- hearing intact Oropharynx- clear Lungs- Clear to ausculation bilaterally, normal work of breathing Heart- Regular rate and rhythm, no murmurs, rubs or gallops, PMI not laterally displaced GI- soft, NT, ND, + BS Extremities- no clubbing, cyanosis, or edema  ILR is interrogated today.  No arrhythmias have been detected  Assessment and Plan:  1.  Recurrent unexplained syncope No recurrence s/p ILR No arrhythmias I will follow remotely and see as needed going forward  Thompson Grayer MD, [facility] 04/29/2017 11:17 AM

## 2017-04-29 NOTE — Patient Instructions (Signed)
Medication Instructions:  Your physician recommends that you continue on your current medications as directed. Please refer to the Current Medication list given to you today.   Labwork: None ordered   Testing/Procedures: None ordered   Follow-Up: Your physician wants you to follow-up : As needed with Dr. Rayann Heman   Any Other Special Instructions Will Be Listed Below (If Applicable).     If you need a refill on your cardiac medications before your next appointment, please call your pharmacy.

## 2017-05-01 ENCOUNTER — Ambulatory Visit (INDEPENDENT_AMBULATORY_CARE_PROVIDER_SITE_OTHER): Payer: Medicare Other | Admitting: *Deleted

## 2017-05-01 DIAGNOSIS — R55 Syncope and collapse: Secondary | ICD-10-CM

## 2017-05-01 NOTE — Progress Notes (Signed)
Carelink Summary Report / Loop Recorder 

## 2017-05-05 MED FILL — ADCIRCA 20 MG TABLET: 20 | 30 days supply | Qty: 60 | Fill #1

## 2017-05-06 ENCOUNTER — Observation Stay (HOSPITAL_COMMUNITY): Payer: Medicare Other

## 2017-05-06 ENCOUNTER — Encounter (HOSPITAL_COMMUNITY): Payer: Self-pay

## 2017-05-06 ENCOUNTER — Inpatient Hospital Stay (HOSPITAL_COMMUNITY)
Admission: EM | Admit: 2017-05-06 | Discharge: 2017-05-08 | DRG: 313 | Disposition: A | Discharge status: Home / Self Care | Payer: Medicare Other | Attending: Internal Medicine | Admitting: Internal Medicine

## 2017-05-06 ENCOUNTER — Emergency Department (HOSPITAL_COMMUNITY): Payer: Medicare Other

## 2017-05-06 DIAGNOSIS — I2721 Secondary pulmonary arterial hypertension: Secondary | ICD-10-CM | POA: Diagnosis present

## 2017-05-06 DIAGNOSIS — D61818 Other pancytopenia: Secondary | ICD-10-CM | POA: Diagnosis not present

## 2017-05-06 DIAGNOSIS — R079 Chest pain, unspecified: Secondary | ICD-10-CM | POA: Diagnosis not present

## 2017-05-06 DIAGNOSIS — N183 Chronic kidney disease, stage 3 (moderate): Secondary | ICD-10-CM | POA: Diagnosis present

## 2017-05-06 DIAGNOSIS — R52 Pain, unspecified: Secondary | ICD-10-CM | POA: Diagnosis not present

## 2017-05-06 DIAGNOSIS — K219 Gastro-esophageal reflux disease without esophagitis: Secondary | ICD-10-CM | POA: Diagnosis present

## 2017-05-06 DIAGNOSIS — I129 Hypertensive chronic kidney disease with stage 1 through stage 4 chronic kidney disease, or unspecified chronic kidney disease: Secondary | ICD-10-CM | POA: Diagnosis present

## 2017-05-06 DIAGNOSIS — R1011 Right upper quadrant pain: Secondary | ICD-10-CM | POA: Diagnosis not present

## 2017-05-06 DIAGNOSIS — E869 Volume depletion, unspecified: Secondary | ICD-10-CM | POA: Diagnosis present

## 2017-05-06 DIAGNOSIS — J449 Chronic obstructive pulmonary disease, unspecified: Secondary | ICD-10-CM | POA: Diagnosis not present

## 2017-05-06 DIAGNOSIS — K766 Portal hypertension: Secondary | ICD-10-CM | POA: Diagnosis present

## 2017-05-06 DIAGNOSIS — Z87891 Personal history of nicotine dependence: Secondary | ICD-10-CM

## 2017-05-06 DIAGNOSIS — G4730 Sleep apnea, unspecified: Secondary | ICD-10-CM | POA: Diagnosis present

## 2017-05-06 DIAGNOSIS — K7030 Alcoholic cirrhosis of liver without ascites: Secondary | ICD-10-CM | POA: Diagnosis present

## 2017-05-06 DIAGNOSIS — R0789 Other chest pain: Secondary | ICD-10-CM | POA: Diagnosis not present

## 2017-05-06 DIAGNOSIS — E039 Hypothyroidism, unspecified: Secondary | ICD-10-CM | POA: Diagnosis present

## 2017-05-06 LAB — BRAIN NATRIURETIC PEPTIDE: B Natriuretic Peptide: 172.4 pg/mL — ABNORMAL HIGH (ref 0.0–100.0)

## 2017-05-06 LAB — BASIC METABOLIC PANEL
ANION GAP: 9 (ref 5–15)
BUN: 30 mg/dL — ABNORMAL HIGH (ref 6–20)
CALCIUM: 8.9 mg/dL (ref 8.9–10.3)
CHLORIDE: 110 mmol/L (ref 101–111)
CO2: 15 mmol/L — ABNORMAL LOW (ref 22–32)
CREATININE: 1.72 mg/dL — AB (ref 0.61–1.24)
GFR calc non Af Amer: 39 mL/min — ABNORMAL LOW (ref >60)
GFR, EST AFRICAN AMERICAN: 46 mL/min — AB (ref >60)
Glucose, Bld: 99 mg/dL (ref 65–99)
Potassium: 3.8 mmol/L (ref 3.5–5.1)
SODIUM: 134 mmol/L — AB (ref 135–145)

## 2017-05-06 LAB — I-STAT TROPONIN, ED
TROPONIN I, POC: 0.02 ng/mL (ref 0.00–0.08)
Troponin i, poc: 0.01 ng/mL (ref 0.00–0.08)

## 2017-05-06 LAB — HEPATIC FUNCTION PANEL
ALT: 34 U/L (ref 17–63)
AST: 92 U/L — AB (ref 15–41)
Albumin: 3.9 g/dL (ref 3.5–5.0)
Alkaline Phosphatase: 130 U/L — ABNORMAL HIGH (ref 38–126)
BILIRUBIN DIRECT: 0.7 mg/dL — AB (ref 0.1–0.5)
BILIRUBIN TOTAL: 1.6 mg/dL — AB (ref 0.3–1.2)
Indirect Bilirubin: 0.9 mg/dL (ref 0.3–0.9)
Total Protein: 7.9 g/dL (ref 6.5–8.1)

## 2017-05-06 LAB — CBC
HCT: 37.3 % — ABNORMAL LOW (ref 39.0–52.0)
HEMOGLOBIN: 12.2 g/dL — AB (ref 13.0–17.0)
MCH: 34.7 pg — ABNORMAL HIGH (ref 26.0–34.0)
MCHC: 32.7 g/dL (ref 30.0–36.0)
MCV: 106 fL — AB (ref 78.0–100.0)
PLATELETS: 44 10*3/uL — AB (ref 150–400)
RBC: 3.52 MIL/uL — AB (ref 4.22–5.81)
RDW: 13.8 % (ref 11.5–15.5)
WBC: 2.2 10*3/uL — ABNORMAL LOW (ref 4.0–10.5)

## 2017-05-06 MED ORDER — MACITENTAN 10 MG PO TABS
10.0000 mg | ORAL_TABLET | Freq: Every day | ORAL | Status: DC
  Administered 2017-05-06 – 2017-05-07 (×2): 10 mg via ORAL
  Filled 2017-05-06 (×4): qty 1

## 2017-05-06 MED ORDER — LOSARTAN POTASSIUM 50 MG PO TABS
50.0000 mg | ORAL_TABLET | Freq: Every day | ORAL | Status: DC
  Administered 2017-05-06 – 2017-05-08 (×3): 50 mg via ORAL
  Filled 2017-05-06 (×3): qty 1

## 2017-05-06 MED ORDER — TADALAFIL (PAH) 20 MG PO TABS
40.0000 mg | ORAL_TABLET | Freq: Every day | ORAL | Status: DC
  Administered 2017-05-06 – 2017-05-08 (×3): 40 mg via ORAL
  Filled 2017-05-06 (×3): qty 2

## 2017-05-06 MED ORDER — LEVOTHYROXINE SODIUM 25 MCG PO TABS
25.0000 ug | ORAL_TABLET | Freq: Every day | ORAL | Status: DC
  Administered 2017-05-07 – 2017-05-08 (×2): 25 ug via ORAL
  Filled 2017-05-06 (×2): qty 1

## 2017-05-06 MED ORDER — ALLOPURINOL 100 MG PO TABS
100.0000 mg | ORAL_TABLET | Freq: Every day | ORAL | Status: DC
  Administered 2017-05-06 – 2017-05-08 (×3): 100 mg via ORAL
  Filled 2017-05-06 (×3): qty 1

## 2017-05-06 MED ORDER — LOPERAMIDE HCL 2 MG PO CAPS: 4.0000 mg | ORAL_CAPSULE | ORAL | Status: DC | PRN

## 2017-05-06 MED ORDER — SODIUM CHLORIDE 0.9% FLUSH
3.0000 mL | Freq: Two times a day (BID) | INTRAVENOUS | Status: DC
  Administered 2017-05-06 – 2017-05-08 (×2): 3 mL via INTRAVENOUS

## 2017-05-06 MED ORDER — HYOSCYAMINE SULFATE ER 0.375 MG PO TB12
0.3750 mg | ORAL_TABLET | Freq: Two times a day (BID) | ORAL | Status: DC
  Administered 2017-05-06 – 2017-05-08 (×5): 0.375 mg via ORAL
  Filled 2017-05-06 (×5): qty 1

## 2017-05-06 MED ORDER — ONDANSETRON HCL 4 MG/2ML IJ SOLN: 4.0000 mg | Freq: Four times a day (QID) | INTRAMUSCULAR | Status: DC | PRN

## 2017-05-06 MED ORDER — ROSUVASTATIN CALCIUM 5 MG PO TABS
5.0000 mg | ORAL_TABLET | Freq: Every day | ORAL | Status: DC
  Administered 2017-05-06 – 2017-05-08 (×3): 5 mg via ORAL
  Filled 2017-05-06 (×3): qty 1

## 2017-05-06 MED ORDER — HEPARIN SODIUM (PORCINE) 5000 UNIT/ML IJ SOLN
5000.0000 [IU] | Freq: Three times a day (TID) | INTRAMUSCULAR | Status: DC
  Administered 2017-05-06 – 2017-05-07 (×3): 5000 [IU] via SUBCUTANEOUS
  Filled 2017-05-06 (×3): qty 1

## 2017-05-06 MED ORDER — SODIUM CHLORIDE 0.9% FLUSH: 3.0000 mL | INTRAVENOUS | Status: DC | PRN

## 2017-05-06 MED ORDER — SELEXIPAG 1000 MCG PO TABS
1000.0000 ug | ORAL_TABLET | Freq: Two times a day (BID) | ORAL | Status: DC
  Administered 2017-05-06 – 2017-05-08 (×4): 1000 ug via ORAL
  Filled 2017-05-06 (×5): qty 1

## 2017-05-06 MED ORDER — ADULT MULTIVITAMIN W/MINERALS CH
1.0000 | ORAL_TABLET | Freq: Every day | ORAL | Status: DC
  Administered 2017-05-06 – 2017-05-08 (×3): 1 via ORAL
  Filled 2017-05-06 (×3): qty 1

## 2017-05-06 MED ORDER — PANTOPRAZOLE SODIUM 40 MG PO TBEC
40.0000 mg | DELAYED_RELEASE_TABLET | Freq: Every day | ORAL | Status: DC
  Administered 2017-05-06 – 2017-05-08 (×3): 40 mg via ORAL
  Filled 2017-05-06 (×3): qty 1

## 2017-05-06 MED ORDER — MOMETASONE FURO-FORMOTEROL FUM 200-5 MCG/ACT IN AERO
2.0000 | INHALATION_SPRAY | Freq: Two times a day (BID) | RESPIRATORY_TRACT | Status: DC
  Administered 2017-05-06 – 2017-05-08 (×4): 2 via RESPIRATORY_TRACT
  Filled 2017-05-06: qty 8.8

## 2017-05-06 MED ORDER — DOXEPIN HCL 10 MG PO CAPS
10.0000 mg | ORAL_CAPSULE | Freq: Every evening | ORAL | Status: DC | PRN
  Filled 2017-05-06: qty 1

## 2017-05-06 MED ORDER — ALBUTEROL SULFATE (2.5 MG/3ML) 0.083% IN NEBU: 3.0000 mL | INHALATION_SOLUTION | Freq: Four times a day (QID) | RESPIRATORY_TRACT | Status: DC | PRN

## 2017-05-06 MED ORDER — SPIRONOLACTONE 25 MG PO TABS
50.0000 mg | ORAL_TABLET | Freq: Every day | ORAL | Status: DC
Start: 2017-05-06 — End: 2017-05-08
  Administered 2017-05-06 – 2017-05-08 (×3): 50 mg via ORAL
  Filled 2017-05-06: qty 2
  Filled 2017-05-06 (×2): qty 1
  Filled 2017-05-06: qty 2
  Filled 2017-05-06: qty 1
  Filled 2017-05-06: qty 2

## 2017-05-06 MED ORDER — SODIUM CHLORIDE 0.9 % IV SOLN: 250.0000 mL | INTRAVENOUS | Status: DC | PRN

## 2017-05-06 MED ORDER — NITROGLYCERIN 0.4 MG SL SUBL
0.4000 mg | SUBLINGUAL_TABLET | SUBLINGUAL | Status: DC | PRN
  Administered 2017-05-06 (×3): 0.4 mg via SUBLINGUAL
  Filled 2017-05-06: qty 1

## 2017-05-06 NOTE — ED Triage Notes (Addendum)
PER EMS: pt reports left sided chest pain, SOB and dizziness that started at 9pm last night. BP-140/88, HR-84. ST depression on EKG. EMS administered 1 nitro SL

## 2017-05-06 NOTE — Consult Note (Signed)
Primary Physician: Primary Cardiologist:  Bensimhon, Allred     HPI: Pt is a 19 wyo with history of EtOH cirrhosis and COPD and portopum HTN with RV failure  Also a history of syncope  Has LINQ   He was just seen by DB in cliinc on 03/04/17    Pt presented to ED last night with CP  Started at 11 PM  Cooking  Had sharp pain L side transient  Positional  Assoc N/V and numbness L arm  Got 1 NTG with releaf  On arrival to ED had sharp right sided pain  /RUQ pain  Worse with movement  Has had some dizzienss in ED with moving around  No Syncope    Cath in June 2017 with minimal CAD  MOderate Pulm HTN  (improved )  PAP 74/26  PCWP 6      Past Medical History:  Diagnosis Date  . Alcohol dependence ([place]) 03/22/2011   In remission   . Alcohol dependence in remission ([place]) 03/22/2011   In remission   . Alcoholic cirrhosis of liver with ascites ([place]) 2014  . Dermatitis 06/10/2015  . Diarrhea 09/04/2013  . Elbow pain, left 03/17/2017  . GERD (gastroesophageal reflux disease)   . Gout   . Heart murmur   . Hx of colonic polyps   . Hypertension   . Hypertriglyceridemia 03/17/2017  . Hypopotassemia   . Otalgia 11/28/2013  . Other chronic pulmonary heart diseases   . Preventative health care 06/29/2016  . Red eye 03/03/2016   right  . Renal insufficiency 07/18/2013  . Seizures ([place])   . Sleep apnea    does not wear CPAP  . Thrombocytopenia ([place]) 06/29/2016  . Unspecified hypothyroidism 01/25/2013  . Unspecified pleural effusion      (Not in a hospital admission)     Infusions:   Allergies  Allergen Reactions  . Aspirin Other (See Comments)    hypertension    Social History   Social History  . Marital status: Divorced    Spouse name: N/A  . Number of children: 3  . Years of education: N/A   Occupational History  . Retired     Social research officer, government   Social History Main Topics  . Smoking status: Former Smoker    Packs/day: 2.00    Years: 5.00    Types: Cigarettes    Quit date:  09/22/1979  . Smokeless tobacco: Never Used  . Alcohol use 3.6 oz/week    6 Shots of liquor per week     Comment: 12/31/2016 " "2 vodka & tonics on MWF"  . Drug use: No  . Sexual activity: Not Currently   Other Topics Concern  . Not on file   Social History Narrative   0 caffeine drinks daily     Family History  Problem Relation Age of Onset  . Heart disease Mother   . Heart attack Mother   . Hypertension Mother   . Prostate cancer Father   . Colon cancer Neg Hx     REVIEW OF SYSTEMS:  All systems reviewed  Negative to the above problem except as noted above.    PHYSICAL EXAM: Vitals:   05/06/17 0700 05/06/17 0715  BP: 116/74 121/74  Pulse:    Resp: 17 19  Temp:      No intake or output data in the 24 hours ending 05/06/17 0759  General:  Well appearing. No respiratory difficulty HEENT: normal Neck: supple. JVP is increased  .  Carotids 2+ bilat; no bruits. No lymphadenopathy or thryomegaly appreciated. Cor: PMI Diffuse  Regular rate & rhythm. No rubs, +S3  No  murmurs. Lungs: clear Abdomen: Tender in RUQ, R ribs  Liver distended.  Otherwise  Good bowel sounds. Extremities: no cyanosis, clubbing, rash, edema Neuro: alert & oriented x 3, cranial nerves grossly intact. moves all 4 extremities w/o difficulty. Affect pleasant.  ECG:  SR  86 bpm  First degree AV  Block  PR 212 msec  RVH with strain pattern   Prolonged QT  Unchanged rel to last EKG     Results for orders placed or performed during the hospital encounter of 05/06/17 (from the past 24 hour(s))  Basic metabolic panel     Status: Abnormal   Collection Time: 05/06/17  2:31 AM  Result Value Ref Range   Sodium 134 (L) 135 - 145 mmol/L   Potassium 3.8 3.5 - 5.1 mmol/L   Chloride 110 101 - 111 mmol/L   CO2 15 (L) 22 - 32 mmol/L   Glucose, Bld 99 65 - 99 mg/dL   BUN 30 (H) 6 - 20 mg/dL   Creatinine, Ser 1.72 (H) 0.61 - 1.24 mg/dL   Calcium 8.9 8.9 - 10.3 mg/dL   GFR calc non Af Amer 39 (L) >60 mL/min   GFR  calc Af Amer 46 (L) >60 mL/min   Anion gap 9 5 - 15  CBC     Status: Abnormal   Collection Time: 05/06/17  2:31 AM  Result Value Ref Range   WBC 2.2 (L) 4.0 - 10.5 K/uL   RBC 3.52 (L) 4.22 - 5.81 MIL/uL   Hemoglobin 12.2 (L) 13.0 - 17.0 g/dL   HCT 37.3 (L) 39.0 - 52.0 %   MCV 106.0 (H) 78.0 - 100.0 fL   MCH 34.7 (H) 26.0 - 34.0 pg   MCHC 32.7 30.0 - 36.0 g/dL   RDW 13.8 11.5 - 15.5 %   Platelets 44 (L) 150 - 400 K/uL  I-stat troponin, ED     Status: None   Collection Time: 05/06/17  3:17 AM  Result Value Ref Range   Troponin i, poc 0.02 0.00 - 0.08 ng/mL   Comment 3          I-Stat Troponin, ED (not at [facility])     Status: None   Collection Time: 05/06/17  7:31 AM  Result Value Ref Range   Troponin i, poc 0.01 0.00 - 0.08 ng/mL   Comment 3           Dg Chest 2 View  Result Date: 05/06/2017 CLINICAL DATA:  Chest pain EXAM: CHEST  2 VIEW COMPARISON:  Chest radiograph 12/31/2016 FINDINGS: Unchanged cardiomegaly. Implanted loop recorder overlies the left heart. No focal consolidation or pulmonary edema. No pleural effusion or pneumothorax. IMPRESSION: Cardiomegaly without acute cardiopulmonary process. Electronically Signed   By: Ulyses Jarred M.D.   On: 05/06/2017 03:12     ASSESSMENT:  1  Chest pain   66 yo with portopulmonary HTN who is followed by D Bensimhon  DOing good recently  Very busy/active with helping family  Doing good until last night developed  episodes of L then R sided CP L sided CP transient  Now with R sided chest/RUQ discomfort   On exam, BP is up  JVP increased   RUQ tender Pt says wt is about 203 which is up from baseline of 200  ECG with chronic signs R heart strain  I have reviewed with  D Bensimhon.  WIll plan to admit   Get LFTs  With wt increase diurese gently  RUQ USN  Per DB report he has not been tender in past.  2  Pulmonary HTN   As above   WIll plan to continue home meds  3  Hx syncope  Curr dizzy though   BP is up   May reflect RV strain    3   Cirrhosis due to EtOH  Reported not using    4  COPD  Follow

## 2017-05-06 NOTE — Progress Notes (Signed)
Pt is alert and oriented with improving pain on Right-side. Denies left arm Pain. [place] Neg, Troponin NEG, Ate Lunch with no Distress.Vital stable High

## 2017-05-06 NOTE — Progress Notes (Signed)
Md called for update, Plan to drink water to Hydrate Kidneys. Labs in the am.

## 2017-05-06 NOTE — Progress Notes (Signed)
Pt is alert and oriented with complaint RUQ pain and Left arm pain.

## 2017-05-06 NOTE — ED Provider Notes (Signed)
[facility] DEPT Provider Note   CSN: [id number] Arrival date & time: 05/06/17  0216     History   Chief Complaint Chief Complaint  Patient presents with  . Chest Pain    HPI Edwar Mueller is a 66 y.o. male.  HPI   Pt is 65 yo male with PMH of HTN, HLD, seizures, GERD, alcoholic cirrhosis, hypothyroidism, COPD, pancytopenia (2/2 splenic sequestration), CKD and pulmonary arterial hypertension who presents to the ED With complaint of chest pain, onset 11 PM. Patient reports all he was standing in his kitchen frying chicken he began to have intermittent sharp pain to the left side of his chest. Denies radiation. Denies aggravating factors. Endorses associated diaphoresis, nausea, vomiting 1 and numbness to his left arm. He reports his chest pain and associated sxs completely resolved after getting one sublingual nitroglycerin via EMS PTA but reports since arrival to the ED he has began to have constant sharp pain to the right side of his chest, denies radiation, denies aggravating or alleviating factors. Denies any aggravating or alleviating factors. Denies fever, chills, headache, lightheadedness, cough, shortness of breath, palpitations, abdominal pain, weakness, syncope. Patient denies taking any other medications prior to arrival. Endorses family history of cardiac disease. Denies smoking. Pt reports only drinking 2 beers last night at dinner.  PCP- Dr. Charlett Blake  Cardiologist- Dr. Haroldine Laws  Past Medical History:  Diagnosis Date  . Alcohol dependence ([place]) 03/22/2011   In remission   . Alcohol dependence in remission ([place]) 03/22/2011   In remission   . Alcoholic cirrhosis of liver with ascites ([place]) 2014  . Dermatitis 06/10/2015  . Diarrhea 09/04/2013  . Elbow pain, left 03/17/2017  . GERD (gastroesophageal reflux disease)   . Gout   . Heart murmur   . Hx of colonic polyps   . Hypertension   . Hypertriglyceridemia 03/17/2017  . Hypopotassemia   . Otalgia 11/28/2013  . Other  chronic pulmonary heart diseases   . Preventative health care 06/29/2016  . Red eye 03/03/2016   right  . Renal insufficiency 07/18/2013  . Seizures ([place])   . Sleep apnea    does not wear CPAP  . Thrombocytopenia ([place]) 06/29/2016  . Unspecified hypothyroidism 01/25/2013  . Unspecified pleural effusion     Patient Active Problem List   Diagnosis Date Noted  . Decreased range of motion of left elbow 04/15/2017  . Hypertriglyceridemia 03/17/2017  . Elbow pain, left 03/17/2017  . Right rib fracture   . Syncope 07/19/2016  . Thrombocytopenia ([place]) 06/29/2016  . Hoarseness, chronic 06/29/2016  . Preventative health care 06/29/2016  . Midline low back pain without sciatica 12/09/2015  . Cirrhosis with alcoholism ([place]) 08/08/2015  . Pancytopenia ([place]) 06/10/2015  . Dermatitis 06/10/2015  . OSA (obstructive sleep apnea) 02/13/2015  . Gout 07/30/2014  . Diarrhea 09/04/2013  . Chronic renal insufficiency, stage III (moderate) 07/18/2013  . Right heart failure due to pulmonary hypertension ([place]) 06/06/2013  . Hypothyroidism 01/25/2013  . PAH (pulmonary arterial hypertension) with portal hypertension ([place]) 01/21/2013  . Allergic rhinitis 12/21/2012  . Secondary pulmonary hypertension 12/09/2012  . Hepatic cirrhosis ([place]) 11/26/2012  . COPD bronchitis 11/01/2012  . GERD (gastroesophageal reflux disease) 09/05/2011  . Hypokalemia 08/29/2011  . Colon polyps 03/26/2011  . Insomnia 03/26/2011  . Hypertension 03/22/2011  . Alcohol dependence ([place]) 03/22/2011    Past Surgical History:  Procedure Laterality Date  . CARDIAC CATHETERIZATION N/A 12/21/2015   Procedure: Right Heart Cath;  Surgeon: Jolaine Artist, MD;  Location:  [facility];  Service: Cardiovascular;  Laterality: N/A;  . CARDIAC CATHETERIZATION N/A 04/15/2016   Procedure: Right/Left Heart Cath and Coronary Angiography;  Surgeon: Jolaine Artist, MD;  Location: [facility];  Service: Cardiovascular;  Laterality:  N/A;  . COLONOSCOPY N/A 12/08/2013   Procedure: COLONOSCOPY;  Surgeon: Milus Banister, MD;  Location: [facility];  Service: Endoscopy;  Laterality: N/A;  . ESOPHAGOGASTRODUODENOSCOPY (EGD) WITH PROPOFOL N/A 03/27/2016   Procedure: ESOPHAGOGASTRODUODENOSCOPY (EGD) WITH PROPOFOL;  Surgeon: Milus Banister, MD;  Location: [facility];  Service: Endoscopy;  Laterality: N/A;  . LOOP RECORDER INSERTION N/A 01/01/2017   Procedure: Loop Recorder Insertion;  Surgeon: Thompson Grayer, MD;  Location: [facility];  Service: Cardiovascular;  Laterality: N/A;  . RIGHT HEART CATHETERIZATION Right 06/06/2014   Procedure: RIGHT HEART CATH;  Surgeon: Jolaine Artist, MD;  Location: [facility] CATH LAB;  Service: Cardiovascular;  Laterality: Right;  . UVULOPALATOPHARYNGOPLASTY  1999       Home Medications    Prior to Admission medications   Medication Sig Start Date End Date Taking? Authorizing Provider  Albuterol Sulfate (PROAIR RESPICLICK) 924 (90 Base) MCG/ACT AEPB Inhale 2 puffs into the lungs every 6 (six) hours as needed (SOB or wheezing).   Yes [provider]  allopurinol (ZYLOPRIM) 100 MG tablet Take 1 tablet (100 mg total) by mouth daily. 04/29/17  Yes Copland, Gay Filler, MD  doxepin (SINEQUAN) 10 MG capsule Take 1-2 capsules (10-20 mg total) by mouth at bedtime as needed. 03/25/17  Yes Mosie Lukes, MD  Fluticasone Propionate 0.05 % LOTN Apply daily to affected area 05/15/15  Yes Mosie Lukes, MD  fluticasone-salmeterol (ADVAIR HFA) 230-21 MCG/ACT inhaler Inhale 2 puffs into the lungs every morning.   Yes [provider]  furosemide (LASIX) 40 MG tablet Take 40 mg (1 tab) once on Monday and Friday mornings. 01/15/17  Yes Clegg, Amy D, NP  levothyroxine (SYNTHROID, LEVOTHROID) 25 MCG tablet TAKE 1 TABLET DAILY 07/13/16  Yes Mosie Lukes, MD  loperamide (ANTI-DIARRHEAL) 2 MG capsule Take 4 mg by mouth as needed for diarrhea or loose stools (up to 8 capsules per day).    Yes  [provider]  losartan (COZAAR) 50 MG tablet Take 50 mg by mouth daily.   Yes [provider]  Macitentan (OPSUMIT) 10 MG TABS Take 10 mg by mouth daily. 03/23/14  Yes Bensimhon, Shaune Pascal, MD  Multiple Vitamin (MULTIVITAMIN WITH MINERALS) TABS tablet Take 1 tablet by mouth daily. 03/17/17  Yes Mosie Lukes, MD  omeprazole (PRILOSEC) 40 MG capsule TAKE 1 CAPSULE DAILY 08/31/16  Yes Mosie Lukes, MD  potassium chloride (K-DUR,KLOR-CON) 10 MEQ tablet Take 10 mEq by mouth. 2 tablets daily, extra 2 tablets on Monday and Friday   Yes [provider]  rosuvastatin (CRESTOR) 5 MG tablet Take 1 tablet (5 mg total) by mouth daily. 03/19/17  Yes Mosie Lukes, MD  Selexipag (UPTRAVI) 1000 MCG TABS Take 1,000 mcg by mouth 2 (two) times daily. 03/04/17  Yes Bensimhon, Shaune Pascal, MD  spironolactone (ALDACTONE) 50 MG tablet TAKE 1 TABLET DAILY 02/27/17  Yes Mosie Lukes, MD  SYMAX-SR 0.375 MG 12 hr tablet TAKE 1 TABLET TWICE A DAY 08/31/16  Yes Mosie Lukes, MD  tadalafil, PAH, (ADCIRCA) 20 MG tablet TAKE 2 TABLETS (40 MG TOTAL) DAILY 04/08/17  Yes Bensimhon, Shaune Pascal, MD    Family History Family History  Problem Relation Age of Onset  .  Heart disease Mother   . Heart attack Mother   . Hypertension Mother   . Prostate cancer Father   . Colon cancer Neg Hx     Social History Social History  Substance Use Topics  . Smoking status: Former Smoker    Packs/day: 2.00    Years: 5.00    Types: Cigarettes    Quit date: 09/22/1979  . Smokeless tobacco: Never Used  . Alcohol use 3.6 oz/week    6 Shots of liquor per week     Comment: 12/31/2016 " "2 vodka & tonics on MWF"     Allergies   Aspirin   Review of Systems Review of Systems  Constitutional: Positive for diaphoresis.  Cardiovascular: Positive for chest pain.  Gastrointestinal: Positive for nausea and vomiting.  Neurological: Positive for numbness.  All other systems reviewed and are  negative.    Physical Exam Updated Vital Signs BP 139/81   Pulse 65   Temp 97.8 F (36.6 C) (Oral)   Resp 19   SpO2 100%   Physical Exam  Constitutional: He is oriented to person, place, and time. He appears well-developed and well-nourished. No distress.  HENT:  Head: Normocephalic and atraumatic.  Mouth/Throat: Oropharynx is clear and moist.  Eyes: Conjunctivae and EOM are normal. Right eye exhibits no discharge. Left eye exhibits no discharge. No scleral icterus.  Neck: Normal range of motion. Neck supple.  Cardiovascular: Normal rate, regular rhythm, normal heart sounds and intact distal pulses.   Pulmonary/Chest: Effort normal and breath sounds normal. No respiratory distress. He has no wheezes. He has no rales. He exhibits no tenderness.  Abdominal: Soft. Bowel sounds are normal. He exhibits no distension and no mass. There is no tenderness. There is no rebound and no guarding.  Musculoskeletal: Normal range of motion. He exhibits no edema or tenderness.  No midline C, T, or L tenderness. Full range of motion of neck and back. Full range of motion of bilateral upper and lower extremities, with 5/5 strength. Sensation intact. 2+ radial and PT pulses. Cap refill <2 seconds.   Neurological: He is alert and oriented to person, place, and time.  Skin: Skin is warm and dry. He is not diaphoretic.  Nursing note and vitals reviewed.    ED Treatments / Results  Labs (all labs ordered are listed, but only abnormal results are displayed) Labs Reviewed  BASIC METABOLIC PANEL - Abnormal; Notable for the following:       Result Value   Sodium 134 (*)    CO2 15 (*)    BUN 30 (*)    Creatinine, Ser 1.72 (*)    GFR calc non Af Amer 39 (*)    GFR calc Af Amer 46 (*)    All other components within normal limits  CBC - Abnormal; Notable for the following:    WBC 2.2 (*)    RBC 3.52 (*)    Hemoglobin 12.2 (*)    HCT 37.3 (*)    MCV 106.0 (*)    MCH 34.7 (*)    Platelets 44 (*)     All other components within normal limits  BRAIN NATRIURETIC PEPTIDE  HEPATIC FUNCTION PANEL  Randolm Idol, ED  I-STAT TROPOININ, ED    EKG  EKG Interpretation  Date/Time:  Wednesday May 06 2017 02:31:54 EDT Ventricular Rate:  83 PR Interval:  212 QRS Duration: 98 QT Interval:  416 QTC Calculation: 488 R Axis:   131 Text Interpretation:  Sinus rhythm with 1st degree  A-V block Right axis deviation Right ventricular hypertrophy ST & Marked T wave abnormality, consider anterolateral ischemia Prolonged QT Abnormal ECG No significant change since last tracing Confirmed by Orpah Greek [phone]) on 05/06/2017 6:53:46 AM       Radiology Dg Chest 2 View  Result Date: 05/06/2017 CLINICAL DATA:  Chest pain EXAM: CHEST  2 VIEW COMPARISON:  Chest radiograph 12/31/2016 FINDINGS: Unchanged cardiomegaly. Implanted loop recorder overlies the left heart. No focal consolidation or pulmonary edema. No pleural effusion or pneumothorax. IMPRESSION: Cardiomegaly without acute cardiopulmonary process. Electronically Signed   By: Ulyses Jarred M.D.   On: 05/06/2017 03:12    Procedures Procedures (including critical care time)  Medications Ordered in ED Medications  nitroGLYCERIN (NITROSTAT) SL tablet 0.4 mg (0.4 mg Sublingual Given 05/06/17 0718)  Albuterol Sulfate AEPB 2 puff (not administered)  allopurinol (ZYLOPRIM) tablet 100 mg (not administered)  doxepin (SINEQUAN) capsule 10 mg (not administered)  mometasone-formoterol (DULERA) 200-5 MCG/ACT inhaler 2 puff (not administered)  levothyroxine (SYNTHROID, LEVOTHROID) tablet 25 mcg (not administered)  loperamide (IMODIUM) capsule 4 mg (not administered)  losartan (COZAAR) tablet 50 mg (not administered)  Macitentan TABS 10 mg (not administered)  multivitamin with minerals tablet 1 tablet (not administered)  pantoprazole (PROTONIX) EC tablet 40 mg (not administered)  rosuvastatin (CRESTOR) tablet 5 mg (not administered)  Selexipag TABS  1,000 mcg (not administered)  spironolactone (ALDACTONE) tablet 50 mg (not administered)  hyoscyamine (LEVBID) 0.375 MG 12 hr tablet 0.375 mg (not administered)  tadalafil (PAH) (ADCIRCA) tablet 40 mg (not administered)     Initial Impression / Assessment and Plan / ED Course  I have reviewed the triage vital signs and the nursing notes.  Pertinent labs & imaging results that were available during my care of the patient were reviewed by me and considered in my medical decision making (see chart for details).     Patient presents with episode of sided chest pain with associated diaphoresis, nausea, vomiting and left arm numbness that resolved after 1 sublingual nitroglycerin given by EMS. He reports since arrival to the ED having sharp right-sided chest pain. PMH of HTN, HLD, seizures, GERD, alcoholic cirrhosis, hypothyroidism, COPD, pancytopenia (2/2 splenic sequestration), CKD and pulmonary arterial hypertension. VSS. Exam unremarkable. Patient reports currently having 7/10 right sided CP. Pt given SL nitro in ED, reports ASA allergy.   Chart review shows pt with heart cath performed on 04/15/2016 showing minimal CAD with moderate PAH and preserved cardiac output. 12/2016 LINQ monitor placed after syncopal episode. Echo on 01/01/17 showed EF 50-55%.  EKG showed sinus rhythm with 1st degree AV block, right axis deviation, ST depression and T wave inversions in anterolateral leads, no significant changes from prior. Trop negative. Labs consistent with pt's baseline levels. CXR negative. HEART score 6. Discussed pt with Dr. Betsey Holiday. Although EKG appears unchanged, plan to consult cardiology due to pt's concerning episode of CP and risk factors/PMH. Dr. Harrington Challenger reports they will come see the pt in the ED.   Plan to admit pt to cardiology for further workup.   Final Clinical Impressions(s) / ED Diagnoses   Final diagnoses:  Chest pain, unspecified type    New Prescriptions New Prescriptions   No  medications on file     Nona Dell, Hershal Coria 05/06/17 Gage, Gwenyth Allegra, MD 05/07/17 [phone]

## 2017-05-07 DIAGNOSIS — N183 Chronic kidney disease, stage 3 (moderate): Secondary | ICD-10-CM | POA: Diagnosis present

## 2017-05-07 DIAGNOSIS — I2721 Secondary pulmonary arterial hypertension: Secondary | ICD-10-CM | POA: Diagnosis not present

## 2017-05-07 DIAGNOSIS — R1011 Right upper quadrant pain: Secondary | ICD-10-CM

## 2017-05-07 DIAGNOSIS — J449 Chronic obstructive pulmonary disease, unspecified: Secondary | ICD-10-CM | POA: Diagnosis present

## 2017-05-07 DIAGNOSIS — E039 Hypothyroidism, unspecified: Secondary | ICD-10-CM | POA: Diagnosis present

## 2017-05-07 DIAGNOSIS — I129 Hypertensive chronic kidney disease with stage 1 through stage 4 chronic kidney disease, or unspecified chronic kidney disease: Secondary | ICD-10-CM | POA: Diagnosis present

## 2017-05-07 DIAGNOSIS — R52 Pain, unspecified: Secondary | ICD-10-CM | POA: Diagnosis not present

## 2017-05-07 DIAGNOSIS — K7030 Alcoholic cirrhosis of liver without ascites: Secondary | ICD-10-CM | POA: Diagnosis present

## 2017-05-07 DIAGNOSIS — R0789 Other chest pain: Secondary | ICD-10-CM | POA: Diagnosis present

## 2017-05-07 DIAGNOSIS — Z87891 Personal history of nicotine dependence: Secondary | ICD-10-CM | POA: Diagnosis not present

## 2017-05-07 DIAGNOSIS — K219 Gastro-esophageal reflux disease without esophagitis: Secondary | ICD-10-CM | POA: Diagnosis present

## 2017-05-07 DIAGNOSIS — G4730 Sleep apnea, unspecified: Secondary | ICD-10-CM | POA: Diagnosis present

## 2017-05-07 DIAGNOSIS — D61818 Other pancytopenia: Secondary | ICD-10-CM | POA: Diagnosis present

## 2017-05-07 DIAGNOSIS — R072 Precordial pain: Secondary | ICD-10-CM | POA: Diagnosis not present

## 2017-05-07 DIAGNOSIS — E869 Volume depletion, unspecified: Secondary | ICD-10-CM | POA: Diagnosis present

## 2017-05-07 DIAGNOSIS — D709 Neutropenia, unspecified: Secondary | ICD-10-CM | POA: Diagnosis not present

## 2017-05-07 DIAGNOSIS — K766 Portal hypertension: Secondary | ICD-10-CM | POA: Diagnosis present

## 2017-05-07 DIAGNOSIS — D696 Thrombocytopenia, unspecified: Secondary | ICD-10-CM | POA: Diagnosis not present

## 2017-05-07 LAB — BASIC METABOLIC PANEL
Anion gap: 7 (ref 5–15)
BUN: 29 mg/dL — ABNORMAL HIGH (ref 6–20)
CALCIUM: 9 mg/dL (ref 8.9–10.3)
CO2: 18 mmol/L — AB (ref 22–32)
CREATININE: 1.52 mg/dL — AB (ref 0.61–1.24)
Chloride: 109 mmol/L (ref 101–111)
GFR, EST AFRICAN AMERICAN: 53 mL/min — AB (ref >60)
GFR, EST NON AFRICAN AMERICAN: 46 mL/min — AB (ref >60)
Glucose, Bld: 100 mg/dL — ABNORMAL HIGH (ref 65–99)
Potassium: 4.7 mmol/L (ref 3.5–5.1)
SODIUM: 134 mmol/L — AB (ref 135–145)

## 2017-05-07 MED ORDER — TRAMADOL HCL 50 MG PO TABS
50.0000 mg | ORAL_TABLET | Freq: Four times a day (QID) | ORAL | Status: DC | PRN
  Administered 2017-05-07: 50 mg via ORAL
  Filled 2017-05-07: qty 1

## 2017-05-07 MED ORDER — ENOXAPARIN SODIUM 40 MG/0.4ML ~~LOC~~ SOLN: 40.0000 mg | Freq: Every day | SUBCUTANEOUS | Status: DC

## 2017-05-07 MED ORDER — ACETAMINOPHEN 325 MG PO TABS
650.0000 mg | ORAL_TABLET | Freq: Four times a day (QID) | ORAL | Status: DC | PRN
  Administered 2017-05-07 – 2017-05-08 (×2): 650 mg via ORAL
  Filled 2017-05-07 (×2): qty 2

## 2017-05-07 NOTE — Progress Notes (Signed)
Advanced Heart Failure Rounding Note  Primary Cardiologist: Dr. Haroldine Laws   Subjective:    Feeling a little better. Still having mild CP with exertion. Primary complaint currently headache (suspect from [facility]), although last dose 24 hrs ago.   Mild lightheadedness with standing.  Creatinine 1.52, near baseline. K 4.7.  Objective:   Weight Range: 193 lb 3.2 oz (87.6 kg) Body mass index is 26.2 kg/m.   Vital Signs:   Temp:  [97.6 F (36.4 C)-98.9 F (37.2 C)] 98.9 F (37.2 C) (07/05 0816) Pulse Rate:  [62-76] 66 (07/05 0816) Resp:  [16-22] 16 (07/05 0816) BP: (117-164)/(71-95) 137/80 (07/05 0816) SpO2:  [98 %-100 %] 100 % (07/05 0854) Weight:  [193 lb 3.2 oz (87.6 kg)-194 lb 2 oz (88.1 kg)] 193 lb 3.2 oz (87.6 kg) (07/05 0440) Last BM Date: 05/05/17  Weight change: Filed Weights   05/06/17 1211 05/06/17 1807 05/07/17 0440  Weight: 194 lb (88 kg) 194 lb 2 oz (88.1 kg) 193 lb 3.2 oz (87.6 kg)    Intake/Output:   Intake/Output Summary (Last 24 hours) at 05/07/17 1000 Last data filed at 05/07/17 0101  Gross per 24 hour  Intake              600 ml  Output              700 ml  Net             -100 ml      Physical Exam    General:  Well appearing. No resp difficulty HEENT: Normal x for facial flushing Neck: Supple. JVP 6-7. Carotids 2+ bilat; no bruits. No lymphadenopathy or thyromegaly appreciated. Cor: PMI nondisplaced. Regular rate & rhythm. 2/6 TR Loud P2 + RV lift.  Lungs: Clear with decreased BS throughout Abdomen: Soft, nontender, nondistended. No hepatosplenomegaly. No bruits or masses. Good bowel sounds. Extremities: No cyanosis, clubbing, rash, edema Neuro: Alert & orientedx3, cranial nerves grossly intact. moves all 4 extremities w/o difficulty. Affect pleasant   Telemetry   Personally reviewed, NSR  EKG    From 05/06/17 personally reviewed. Unchanged from previous.   Labs    CBC  Recent Labs  05/06/17 0231  WBC 2.2*  HGB 12.2*  HCT  37.3*  MCV 106.0*  PLT 44*   Basic Metabolic Panel  Recent Labs  05/06/17 0231 05/07/17 0332  NA 134* 134*  K 3.8 4.7  CL 110 109  CO2 15* 18*  GLUCOSE 99 100*  BUN 30* 29*  CREATININE 1.72* 1.52*  CALCIUM 8.9 9.0   Liver Function Tests  Recent Labs  05/06/17 1139  AST 92*  ALT 34  ALKPHOS 130*  BILITOT 1.6*  PROT 7.9  ALBUMIN 3.9   No results for input(s): LIPASE, AMYLASE in the last 72 hours. Cardiac Enzymes No results for input(s): CKTOTAL, CKMB, CKMBINDEX, TROPONINI in the last 72 hours.  BNP: BNP (last 3 results)  Recent Labs  07/19/16 0223 12/31/16 0257 05/06/17 1139  BNP 665.9* 589.6* 172.4*    ProBNP (last 3 results)  Recent Labs  07/22/16 1317  PROBNP 327.0*     D-Dimer No results for input(s): DDIMER in the last 72 hours. Hemoglobin A1C No results for input(s): HGBA1C in the last 72 hours. Fasting Lipid Panel No results for input(s): CHOL, HDL, LDLCALC, TRIG, CHOLHDL, LDLDIRECT in the last 72 hours. Thyroid Function Tests No results for input(s): TSH, T4TOTAL, T3FREE, THYROIDAB in the last 72 hours.  Invalid input(s): FREET3  Other results:  Imaging    US Abdomen Limited Ruq  Result Date: 05/06/2017 CLINICAL DATA:  Acute onset right upper quadrant pain last night in patient with a history of cirrhosis. EXAM: ULTRASOUND ABDOMEN LIMITED RIGHT UPPER QUADRANT COMPARISON:  Abdominal ultrasound 06/18/2015. FINDINGS: Gallbladder: No gallstones or wall thickening visualized. No sonographic Murphy sign noted by sonographer. Common bile duct: Diameter: 0.6 cm Liver: Nodular liver border consistent with cirrhosis is identified. No focal lesion. There is hepatopetal flow in the portal vein. No ascites is seen. IMPRESSION: No acute abnormality. Cirrhotic liver without focal lesion. Electronically Signed   By: Inge Rise M.D.   On: 05/06/2017 12:20      Medications:     Scheduled Medications: . allopurinol  100 mg Oral Daily  .  heparin  5,000 Units Subcutaneous Q8H  . hyoscyamine  0.375 mg Oral BID  . levothyroxine  25 mcg Oral QAC breakfast  . losartan  50 mg Oral Daily  . Macitentan  10 mg Oral Daily  . mometasone-formoterol  2 puff Inhalation BID  . multivitamin with minerals  1 tablet Oral Daily  . pantoprazole  40 mg Oral Daily  . rosuvastatin  5 mg Oral Daily  . Selexipag  1,000 mcg Oral BID  . sodium chloride flush  3 mL Intravenous Q12H  . spironolactone  50 mg Oral Daily  . tadalafil (PAH)  40 mg Oral Daily     Infusions: . sodium chloride       PRN Medications:  sodium chloride, albuterol, doxepin, loperamide, ondansetron (ZOFRAN) IV, sodium chloride flush, traMADol  Patient Profile   Darren Mueller is a 66 y.o. male  is a 66 yo with history of COPD, portopulmonary hypertension with RV failure, and ETOH cirrhosis. Admitted 05/06/17 with CP. Troponin negative. BNP low. Creatinine mildly elevated from baseline of 1.4 - 1.6  Assessment/Plan   1. Chest pain - Low suspicion for ACS. Suspect in setting of volume depletion.  - Somewhat improved.  - HA 2/2 to NTG. Will not give any further.  - Trop negative.  - Will consult cardiac rehab.  2. Pulmonary HTN - Stable. Continue home meds 3. Hx of Syncope. - Remains lightheaded. Suspect volume depleted 4. Cirrhosis due to EtOH - Abstinent from EtOH. 5. COPD - No active wheezing. Continue home meds.  6. HA - Likely 2/2 to NTG - Will give prn tramadol.   Will watch overnight with low weight and ongoing mild CP. Suspect volume depleted. Liberalize fluid intake today. No diuresis.   Length of Stay: 0   Shirley Friar, PA-C  05/07/2017, 10:00 AM  Advanced Heart Failure Team Pager [phone] (M-F; 7a - 4p)  Please contact [facility] for night-coverage after hours (4p -7a ) and weekends on [email or web address]  Patient seen and examined with the above-signed Advanced Practice Provider and/or Housestaff. I personally reviewed  laboratory data, imaging studies and relevant notes. I independently examined the patient and formulated the important aspects of the plan. I have edited the note to reflect any of my changes or salient points. I have personally discussed the plan with the patient and/or family.  Overall stable. Suspect major issue is stress related due to having to take care of both of his elderly parents. He also appears a bit volume depleted. Encouraged po intake. RUQ pain has resolved. RUQ u//s reviewed personally - shows cirrhosis but no acute pathology. LFTs ok. HA likely related to NTG.   Will ambulate him. Treat HA. Hopefully home in am, if  remains stable.   Glori Bickers, MD  8:16 PM

## 2017-05-08 LAB — BASIC METABOLIC PANEL
Anion gap: 7 (ref 5–15)
BUN: 27 mg/dL — AB (ref 6–20)
CO2: 21 mmol/L — ABNORMAL LOW (ref 22–32)
CREATININE: 1.6 mg/dL — AB (ref 0.61–1.24)
Calcium: 9.1 mg/dL (ref 8.9–10.3)
Chloride: 105 mmol/L (ref 101–111)
GFR calc Af Amer: 50 mL/min — ABNORMAL LOW (ref >60)
GFR, EST NON AFRICAN AMERICAN: 43 mL/min — AB (ref >60)
GLUCOSE: 117 mg/dL — AB (ref 65–99)
Potassium: 3.7 mmol/L (ref 3.5–5.1)
SODIUM: 133 mmol/L — AB (ref 135–145)

## 2017-05-08 LAB — CBC WITH DIFFERENTIAL/PLATELET
BASOS ABS: 0 10*3/uL (ref 0.0–0.1)
Basophils Relative: 1 %
EOS PCT: 2 %
Eosinophils Absolute: 0 10*3/uL (ref 0.0–0.7)
HCT: 36.2 % — ABNORMAL LOW (ref 39.0–52.0)
Hemoglobin: 11.7 g/dL — ABNORMAL LOW (ref 13.0–17.0)
LYMPHS ABS: 0.6 10*3/uL — AB (ref 0.7–4.0)
Lymphocytes Relative: 37 %
MCH: 34 pg (ref 26.0–34.0)
MCHC: 32.3 g/dL (ref 30.0–36.0)
MCV: 105.2 fL — AB (ref 78.0–100.0)
MONO ABS: 0.2 10*3/uL (ref 0.1–1.0)
Monocytes Relative: 13 %
NEUTROS ABS: 0.8 10*3/uL — AB (ref 1.7–7.7)
Neutrophils Relative %: 47 %
PLATELETS: 32 10*3/uL — AB (ref 150–400)
RBC: 3.44 MIL/uL — AB (ref 4.22–5.81)
RDW: 13.5 % (ref 11.5–15.5)
WBC: 1.6 10*3/uL — AB (ref 4.0–10.5)

## 2017-05-08 LAB — PATHOLOGIST SMEAR REVIEW

## 2017-05-08 MED ORDER — FUROSEMIDE 40 MG PO TABS: ORAL_TABLET | ORAL | 3 refills | Status: DC

## 2017-05-08 MED ORDER — POTASSIUM CHLORIDE CRYS ER 10 MEQ PO TBCR: EXTENDED_RELEASE_TABLET | ORAL | 6 refills | Status: DC

## 2017-05-08 MED FILL — POTASSIUM CL 10 MEQ TAB SA: 10 | 10 days supply | Qty: 20 | Fill #0

## 2017-05-08 NOTE — Progress Notes (Signed)
Pt discharged without paperwork, signature not received due to printer issues. Pt's AVS was reviewed on the computer screen and pt advised to check back later for his print out.   Pt will not be leaving the hospital yet, as his father was admitted last night for a stroke; so he will be checking back prior to literal departure.

## 2017-05-08 NOTE — Progress Notes (Signed)
Advanced Heart Failure Rounding Note  Primary Cardiologist: Dr. Haroldine Laws   Subjective:   Feeling better. No headache. Denies CP.   Objective:   Weight Range: 194 lb 1.6 oz (88 kg) Body mass index is 26.32 kg/m.   Vital Signs:   Temp:  [97.7 F (36.5 C)-97.9 F (36.6 C)] 97.9 F (36.6 C) (07/06 0607) Pulse Rate:  [71-72] 71 (07/06 0607) Resp:  [18] 18 (07/06 0607) BP: (109-130)/(70-98) 115/70 (07/06 0607) SpO2:  [97 %-100 %] 98 % (07/06 0607) Weight:  [194 lb 1.6 oz (88 kg)] 194 lb 1.6 oz (88 kg) (07/06 0607) Last BM Date: 05/07/17  Weight change: Filed Weights   05/06/17 1807 05/07/17 0440 05/08/17 0607  Weight: 194 lb 2 oz (88.1 kg) 193 lb 3.2 oz (87.6 kg) 194 lb 1.6 oz (88 kg)    Intake/Output:   Intake/Output Summary (Last 24 hours) at 05/08/17 0838 Last data filed at 05/08/17 0813  Gross per 24 hour  Intake              720 ml  Output              250 ml  Net              470 ml      Physical Exam  General:  Well appearing. No resp difficulty. SItting on the side of the bed.  HEENT: normal Neck: supple. JVD 5-6. Carotids 2+ bilat; no bruits. No lymphadenopathy or thryomegaly appreciated. Cor: PMI nondisplaced. Regular rate & rhythm. No rubs, gallops . 2/6 TR Loud P2 + RV lift.  Lungs: clear Abdomen: soft, nontender, nondistended. No hepatosplenomegaly. No bruits or masses. Good bowel sounds. Extremities: no cyanosis, clubbing, rash, edema Neuro: alert & orientedx3, cranial nerves grossly intact. moves all 4 extremities w/o difficulty. Affect pleasant     Telemetry   NSR personally reviewed.   EKG    From 05/06/17 personally reviewed. Unchanged from previous.   Labs    CBC  Recent Labs  05/06/17 0231 05/08/17 0316  WBC 2.2* 1.6*  NEUTROABS  --  0.8*  HGB 12.2* 11.7*  HCT 37.3* 36.2*  MCV 106.0* 105.2*  PLT 44* 32*   Basic Metabolic Panel  Recent Labs  05/07/17 0332 05/08/17 0316  NA 134* 133*  K 4.7 3.7  CL 109 105  CO2 18*  21*  GLUCOSE 100* 117*  BUN 29* 27*  CREATININE 1.52* 1.60*  CALCIUM 9.0 9.1   Liver Function Tests  Recent Labs  05/06/17 1139  AST 92*  ALT 34  ALKPHOS 130*  BILITOT 1.6*  PROT 7.9  ALBUMIN 3.9   No results for input(s): LIPASE, AMYLASE in the last 72 hours. Cardiac Enzymes No results for input(s): CKTOTAL, CKMB, CKMBINDEX, TROPONINI in the last 72 hours.  BNP: BNP (last 3 results)  Recent Labs  07/19/16 0223 12/31/16 0257 05/06/17 1139  BNP 665.9* 589.6* 172.4*    ProBNP (last 3 results)  Recent Labs  07/22/16 1317  PROBNP 327.0*     D-Dimer No results for input(s): DDIMER in the last 72 hours. Hemoglobin A1C No results for input(s): HGBA1C in the last 72 hours. Fasting Lipid Panel No results for input(s): CHOL, HDL, LDLCALC, TRIG, CHOLHDL, LDLDIRECT in the last 72 hours. Thyroid Function Tests No results for input(s): TSH, T4TOTAL, T3FREE, THYROIDAB in the last 72 hours.  Invalid input(s): FREET3  Other results:   Imaging    No results found.   Medications:  Scheduled Medications: . allopurinol  100 mg Oral Daily  . hyoscyamine  0.375 mg Oral BID  . levothyroxine  25 mcg Oral QAC breakfast  . losartan  50 mg Oral Daily  . Macitentan  10 mg Oral Daily  . mometasone-formoterol  2 puff Inhalation BID  . multivitamin with minerals  1 tablet Oral Daily  . pantoprazole  40 mg Oral Daily  . rosuvastatin  5 mg Oral Daily  . Selexipag  1,000 mcg Oral BID  . sodium chloride flush  3 mL Intravenous Q12H  . spironolactone  50 mg Oral Daily  . tadalafil (PAH)  40 mg Oral Daily    Infusions: . sodium chloride      PRN Medications: sodium chloride, acetaminophen, albuterol, doxepin, loperamide, ondansetron (ZOFRAN) IV, sodium chloride flush, traMADol  Patient Profile   Darren Mueller is a 66 y.o. male  is a 66 yo with history of COPD, portopulmonary hypertension with RV failure, and ETOH cirrhosis. Admitted 05/06/17 with CP.  Troponin negative. BNP low. Creatinine mildly elevated from baseline of 1.4 - 1.6  Assessment/Plan   1. Chest pain - Low suspicion for ACS. Suspect in setting of volume depletion.  -  Trop negative.  -No chest pain.  2. Pulmonary HTN - No changes. Continue current regimen. Hold lasix. I have asked him to take lasix Mon And Friday only if weight is 200 pounds or greater.  3. Hx of Syncope- no problems. . -4. Cirrhosis due to EtOH- Noted on [place]. Discussed ETOH cessation. 5. COPD - No active wheezing. Continue home meds.  6. HA- resolved.  Home today. Follow up in HF clininc in a few weeks.    Length of Stay: 1   Amy Clegg, NP  05/08/2017, 8:38 AM  Advanced Heart Failure Team Pager [phone] (M-F; 7a - 4p)  Please contact [facility] for night-coverage after hours (4p -7a ) and weekends on [email or web address]  Patient seen and examined with Darrick Grinder, NP. We discussed all aspects of the encounter. I agree with the assessment and plan as stated above.   He is much improved. Can go home today. Hold diuretics until fluid status drifts up slowly. Continue PAH meds.   Glori Bickers, MD  5:18 PM

## 2017-05-08 NOTE — Progress Notes (Signed)
Call placed to [facility] to notify of telemetry monitoring d/c.   

## 2017-05-08 NOTE — Progress Notes (Signed)
Patient's avs has now printed, is available in an envelope for pt pickup, with signature sheet on top for pt to sign at pick up.

## 2017-05-08 NOTE — Progress Notes (Signed)
Patient's home medications received from pharmacy. Provided to pt for discharge.

## 2017-05-08 NOTE — Discharge Summary (Signed)
Advanced Heart Failure Team  Discharge Summary   Patient ID: Darren Mueller MRN: [id number], DOB/AGE: 67-29-1952 67 y.o. Admit date: 05/06/2017 D/C date:     05/08/2017   Primary Discharge Diagnoses:  1. Chest Pain 2. Pulmonary HTN 3. H/O syncope 4. Cirrhosis sue to ETOH 5. COPD 6. Headache  Hospital Course:  Darren Mueller is a 66 y.o. male  is a 66yo with history of COPD, PAH, RV failure, and ETOH cirrhosis. Admitted 05/06/17 with CP and abdominal pain. He received NTG in the ED. Troponin negative. BNP low. Creatinine mildly elevated from baseline of 1.4 - 1.6.   Volume depleted on admit so diuretics held so lasix and potassium changed to as needed for weight 200 pounds or greater.  Headache thought to be related to ntg that was given on admit and gradually resolved. He continued on [facility] medications. Discharged today in stable condition. Plan to follow up in HF clinic in a few weeks. Check BMET at that time.    Discharge Weight : 194 pounds Discharge Vitals: Blood pressure 115/70, pulse 71, temperature 97.9 F (36.6 C), temperature source Oral, resp. rate 18, height 6' (1.829 m), weight 194 lb 1.6 oz (88 kg), SpO2 98 %.  Labs: Lab Results  Component Value Date   WBC 1.6 (L) 05/08/2017   HGB 11.7 (L) 05/08/2017   HCT 36.2 (L) 05/08/2017   MCV 105.2 (H) 05/08/2017   PLT 32 (L) 05/08/2017    Recent Labs Lab 05/06/17 1139  05/08/17 0316  NA  --   < > 133*  K  --   < > 3.7  CL  --   < > 105  CO2  --   < > 21*  BUN  --   < > 27*  CREATININE  --   < > 1.60*  CALCIUM  --   < > 9.1  PROT 7.9  --   --   BILITOT 1.6*  --   --   ALKPHOS 130*  --   --   ALT 34  --   --   AST 92*  --   --   GLUCOSE  --   < > 117*  < > = values in this interval not displayed. Lab Results  Component Value Date   CHOL 216 (H) 03/17/2017   HDL 69.20 03/17/2017   LDLCALC 77 12/04/2015   TRIG 366.0 (H) 03/17/2017   BNP (last 3 results)  Recent Labs  07/19/16 0223 12/31/16 0257  05/06/17 1139  BNP 665.9* 589.6* 172.4*    ProBNP (last 3 results)  Recent Labs  07/22/16 1317  PROBNP 327.0*     Diagnostic Studies/Procedures   US Abdomen Limited Ruq  Result Date: 05/06/2017 CLINICAL DATA:  Acute onset right upper quadrant pain last night in patient with a history of cirrhosis. EXAM: ULTRASOUND ABDOMEN LIMITED RIGHT UPPER QUADRANT COMPARISON:  Abdominal ultrasound 06/18/2015. FINDINGS: Gallbladder: No gallstones or wall thickening visualized. No sonographic Murphy sign noted by sonographer. Common bile duct: Diameter: 0.6 cm Liver: Nodular liver border consistent with cirrhosis is identified. No focal lesion. There is hepatopetal flow in the portal vein. No ascites is seen. IMPRESSION: No acute abnormality. Cirrhotic liver without focal lesion. Electronically Signed   By: Drusilla Kanner M.D.   On: 05/06/2017 12:20    Discharge Medications   Allergies as of 05/08/2017      Reactions   Aspirin Other (See Comments)   hypertension   Nitroglycerin    Headache  Medication List    TAKE these medications   allopurinol 100 MG tablet Commonly known as:  ZYLOPRIM Take 1 tablet (100 mg total) by mouth daily.   ANTI-DIARRHEAL 2 MG capsule Generic drug:  loperamide Take 4 mg by mouth as needed for diarrhea or loose stools (up to 8 capsules per day).   doxepin 10 MG capsule Commonly known as:  SINEQUAN Take 1-2 capsules (10-20 mg total) by mouth at bedtime as needed.   Fluticasone Propionate 0.05 % Lotn Apply daily to affected area   fluticasone-salmeterol 230-21 MCG/ACT inhaler Commonly known as:  ADVAIR HFA Inhale 2 puffs into the lungs every morning.   furosemide 40 MG tablet Commonly known as:  LASIX Take 40 mg (1 tab) once on Monday and Friday mornings. Only take if weight is 200 pounds or greater. Start taking on:  05/11/2017 What changed:  additional instructions  These instructions start on 05/11/2017. If you are unsure what to do until then,  ask your doctor or other care provider.   levothyroxine 25 MCG tablet Commonly known as:  SYNTHROID, LEVOTHROID TAKE 1 TABLET DAILY   losartan 50 MG tablet Commonly known as:  COZAAR Take 50 mg by mouth daily.   Macitentan 10 MG Tabs Commonly known as:  OPSUMIT Take 10 mg by mouth daily.   multivitamin with minerals Tabs tablet Take 1 tablet by mouth daily.   omeprazole 40 MG capsule Commonly known as:  PRILOSEC TAKE 1 CAPSULE DAILY   potassium chloride 10 MEQ tablet Commonly known as:  K-DUR,KLOR-CON 2 tablets daily, extra 2 tablets on Monday and Friday only if you take lasix. Start taking on:  05/11/2017 What changed:  how much to take  how to take this  additional instructions  These instructions start on 05/11/2017. If you are unsure what to do until then, ask your doctor or other care provider.   PROAIR RESPICLICK 108 (90 Base) MCG/ACT Aepb Generic drug:  Albuterol Sulfate Inhale 2 puffs into the lungs every 6 (six) hours as needed (SOB or wheezing).   rosuvastatin 5 MG tablet Commonly known as:  CRESTOR Take 1 tablet (5 mg total) by mouth daily.   Selexipag 1000 MCG Tabs Commonly known as:  UPTRAVI Take 1,000 mcg by mouth 2 (two) times daily.   spironolactone 50 MG tablet Commonly known as:  ALDACTONE TAKE 1 TABLET DAILY   SYMAX-SR 0.375 MG 12 hr tablet Generic drug:  hyoscyamine TAKE 1 TABLET TWICE A DAY   tadalafil (PAH) 20 MG tablet Commonly known as:  ADCIRCA TAKE 2 TABLETS (40 MG TOTAL) DAILY       Disposition   The patient will be discharged in stable condition to home. Discharge Instructions    (HEART FAILURE PATIENTS) Call MD:  Anytime you have any of the following symptoms: 1) 3 pound weight gain in 24 hours or 5 pounds in 1 week 2) shortness of breath, with or without a dry hacking cough 3) swelling in the hands, feet or stomach 4) if you have to sleep on extra pillows at night in order to breathe.    Complete by:  As directed    Diet -  low sodium heart healthy    Complete by:  As directed    Heart Failure patients record your daily weight using the same scale at the same time of day    Complete by:  As directed    Increase activity slowly    Complete by:  As directed  Follow-up Information    Manali Mcelmurry, Bevelyn Buckles, MD Follow up on 05/19/2017.   Specialty:  Cardiology Why:  at  11:00 Garage Code [id number] Contact information: [address] [place] [address] [phone]             Duration of Discharge Encounter: Greater than 35 minutes   Signed, Tonye Becket  NP-C  05/08/2017, 8:58 AM

## 2017-05-11 ENCOUNTER — Telehealth: Payer: Self-pay | Admitting: Family Medicine

## 2017-05-11 ENCOUNTER — Other Ambulatory Visit (INDEPENDENT_AMBULATORY_CARE_PROVIDER_SITE_OTHER): Payer: Medicare Other

## 2017-05-11 DIAGNOSIS — N289 Disorder of kidney and ureter, unspecified: Secondary | ICD-10-CM | POA: Diagnosis not present

## 2017-05-11 DIAGNOSIS — R945 Abnormal results of liver function studies: Secondary | ICD-10-CM

## 2017-05-11 DIAGNOSIS — K7689 Other specified diseases of liver: Secondary | ICD-10-CM

## 2017-05-11 LAB — CUP PACEART REMOTE DEVICE CHECK
Implantable Pulse Generator Implant Date: 20180301
MDC IDC SESS DTM: 20180629160823

## 2017-05-11 LAB — CBC WITH DIFFERENTIAL/PLATELET
BASOS PCT: 0.3 % (ref 0.0–3.0)
Basophils Absolute: 0 10*3/uL (ref 0.0–0.1)
EOS PCT: 1.7 % (ref 0.0–5.0)
Eosinophils Absolute: 0 10*3/uL (ref 0.0–0.7)
HCT: 36.1 % — ABNORMAL LOW (ref 39.0–52.0)
Hemoglobin: 12.5 g/dL — ABNORMAL LOW (ref 13.0–17.0)
LYMPHS ABS: 0.8 10*3/uL (ref 0.7–4.0)
Lymphocytes Relative: 37.4 % (ref 12.0–46.0)
MCHC: 34.7 g/dL (ref 30.0–36.0)
MCV: 106.5 fl — ABNORMAL HIGH (ref 78.0–100.0)
MONO ABS: 0.3 10*3/uL (ref 0.1–1.0)
Monocytes Relative: 14.5 % — ABNORMAL HIGH (ref 3.0–12.0)
NEUTROS ABS: 1 10*3/uL — AB (ref 1.4–7.7)
Neutrophils Relative %: 46.1 % (ref 43.0–77.0)
Platelets: 40 10*3/uL — CL (ref 150.0–400.0)
RBC: 3.39 Mil/uL — ABNORMAL LOW (ref 4.22–5.81)
RDW: 14.3 % (ref 11.5–15.5)
WBC: 2.1 10*3/uL — ABNORMAL LOW (ref 4.0–10.5)

## 2017-05-11 LAB — COMPREHENSIVE METABOLIC PANEL
ALBUMIN: 4 g/dL (ref 3.5–5.2)
ALT: 36 U/L (ref 0–53)
AST: 62 U/L — ABNORMAL HIGH (ref 0–37)
Alkaline Phosphatase: 172 U/L — ABNORMAL HIGH (ref 39–117)
BUN: 33 mg/dL — ABNORMAL HIGH (ref 6–23)
CHLORIDE: 108 meq/L (ref 96–112)
CO2: 18 mEq/L — ABNORMAL LOW (ref 19–32)
Calcium: 9 mg/dL (ref 8.4–10.5)
Creatinine, Ser: 1.57 mg/dL — ABNORMAL HIGH (ref 0.40–1.50)
GFR: 56.91 mL/min — ABNORMAL LOW (ref >60.00)
Glucose, Bld: 107 mg/dL — ABNORMAL HIGH (ref 70–99)
POTASSIUM: 4 meq/L (ref 3.5–5.1)
SODIUM: 135 meq/L (ref 135–145)
Total Bilirubin: 0.7 mg/dL (ref 0.2–1.2)
Total Protein: 7.4 g/dL (ref 6.0–8.3)

## 2017-05-11 NOTE — Telephone Encounter (Signed)
Call from lab with platelet count of 40 which is his baseline

## 2017-05-13 ENCOUNTER — Encounter: Payer: Self-pay | Admitting: Family Medicine

## 2017-05-13 ENCOUNTER — Ambulatory Visit (INDEPENDENT_AMBULATORY_CARE_PROVIDER_SITE_OTHER): Payer: Medicare Other | Admitting: Family Medicine

## 2017-05-13 VITALS — BP 130/72 | HR 82 | Ht 72.0 in | Wt 201.0 lb

## 2017-05-13 DIAGNOSIS — M25522 Pain in left elbow: Secondary | ICD-10-CM

## 2017-05-13 NOTE — Patient Instructions (Signed)
Good to see you  PT will be calling you  Use a hammer and try to get the rest of the motion back.  Arnica lotion 2 times daily can help  pennsaid pinkie amount topically 2 times daily as needed.  See me again in 4-6 weeks.

## 2017-05-13 NOTE — Progress Notes (Signed)
[facility] [place], [place] [address] Phone: [phone] Subjective:    I'm seeing this patient by the request  of:  Mosie Lukes, MD   CC: Left elbow pain f/u  [id number]  Darren Mueller is a 66 y.o. male coming in with complaint of left elbow pain. Past medical history significant for gout. Patient was recently hospitalized for chest pain as well. Patient states he was doing much better while he was doing exercises on a regular basis. States that he is noticed some improvement in the range of motion. Maybe less numbness than he had previously. Patient denies any weakness at this time. Patient is happy that he has made some improvement but states that he is still not his baseline.  Past Medical History:  Diagnosis Date  . Alcohol dependence ([place]) 03/22/2011   In remission   . Alcohol dependence in remission ([place]) 03/22/2011   In remission   . Alcoholic cirrhosis of liver with ascites ([place]) 2014  . Dermatitis 06/10/2015  . Diarrhea 09/04/2013  . Elbow pain, left 03/17/2017  . GERD (gastroesophageal reflux disease)   . Gout   . Heart murmur   . Hx of colonic polyps   . Hypertension   . Hypertriglyceridemia 03/17/2017  . Hypopotassemia   . Otalgia 11/28/2013  . Other chronic pulmonary heart diseases   . Preventative health care 06/29/2016  . Red eye 03/03/2016   right  . Renal insufficiency 07/18/2013  . Seizures ([place])   . Sleep apnea    does not wear CPAP  . Thrombocytopenia ([place]) 06/29/2016  . Unspecified hypothyroidism 01/25/2013  . Unspecified pleural effusion    Past Surgical History:  Procedure Laterality Date  . CARDIAC CATHETERIZATION N/A 12/21/2015   Procedure: Right Heart Cath;  Surgeon: Jolaine Artist, MD;  Location: [facility];  Service: Cardiovascular;  Laterality: N/A;  . CARDIAC CATHETERIZATION N/A 04/15/2016   Procedure: Right/Left Heart Cath and Coronary Angiography;  Surgeon: Jolaine Artist, MD;   Location: [facility] [facility];  Service: Cardiovascular;  Laterality: N/A;  . COLONOSCOPY N/A 12/08/2013   Procedure: COLONOSCOPY;  Surgeon: Milus Banister, MD;  Location: [facility];  Service: Endoscopy;  Laterality: N/A;  . ESOPHAGOGASTRODUODENOSCOPY (EGD) WITH PROPOFOL N/A 03/27/2016   Procedure: ESOPHAGOGASTRODUODENOSCOPY (EGD) WITH PROPOFOL;  Surgeon: Milus Banister, MD;  Location: [facility];  Service: Endoscopy;  Laterality: N/A;  . LOOP RECORDER INSERTION N/A 01/01/2017   Procedure: Loop Recorder Insertion;  Surgeon: Thompson Grayer, MD;  Location: [facility];  Service: Cardiovascular;  Laterality: N/A;  . RIGHT HEART CATHETERIZATION Right 06/06/2014   Procedure: RIGHT HEART CATH;  Surgeon: Jolaine Artist, MD;  Location: [facility];  Service: Cardiovascular;  Laterality: Right;  . UVULOPALATOPHARYNGOPLASTY  1999   Social History   Social History  . Marital status: Divorced    Spouse name: N/A  . Number of children: 3  . Years of education: N/A   Occupational History  . Retired     Social research officer, government   Social History Main Topics  . Smoking status: Former Smoker    Packs/day: 2.00    Years: 5.00    Types: Cigarettes    Quit date: 09/22/1979  . Smokeless tobacco: Never Used  . Alcohol use 3.6 oz/week    6 Shots of liquor per week     Comment: 12/31/2016 " "2 vodka & tonics on MWF"  . Drug use: No  . Sexual  activity: Not Currently   Other Topics Concern  . None   Social History Narrative   0 caffeine drinks daily    Allergies  Allergen Reactions  . Aspirin Other (See Comments)    hypertension  . Nitroglycerin     Headache   Family History  Problem Relation Age of Onset  . Heart disease Mother   . Heart attack Mother   . Hypertension Mother   . Prostate cancer Father   . Colon cancer Neg Hx     Past medical history, social, surgical and family history all reviewed in electronic medical record.  No pertanent information unless stated regarding to the chief  complaint.   Review of Systems: No headache, visual changes, nausea, vomiting, diarrhea, constipation, dizziness, abdominal pain, skin rash, fevers, chills, night sweats, weight loss, swollen lymph nodes, body aches, joint swelling, chest pain, shortness of breath, mood changes.   Positive muscle aches   Objective  Blood pressure 130/72, pulse 82, height 6' (1.829 m), weight 201 lb (91.2 kg).   Systems examined below as of 05/13/17 General: NAD A&O x3 mood, affect normal  HEENT: Pupils equal, extraocular movements intact no nystagmus Respiratory: not short of breath at rest or with speaking Cardiovascular: No lower extremity edema, non tender Skin: Warm dry intact with no signs of infection or rash on extremities or on axial skeleton. Abdomen: Soft nontender, no masses Neuro: Cranial nerves  intact, neurovascularly intact in all extremities with 2+ DTRs and 2+ pulses. Lymph: No lymphadenopathy appreciated today  Gait mild antalgic MSK: Non tender with full range of motion and good stability and symmetric strength and tone of shoulders, wrist,  knee hips and ankles bilaterally.  Arthritic changes of multiple joints Elbow: Left Unremarkable to inspection. Still lacking the last 5 of flexion of the elbow as well as the last 5 of supination. Otherwise has full range of motion. No discrete areas of tenderness to palpation. Patient though does have significant worsening pain over the ulnar nerve with full flexion. Ulnar nerve does not sublux. Negative cubital tunnel Tinel's. Contralateral elbow unremarkable     Impression and Recommendations:     This case required medical decision making of moderate complexity.      Note: This dictation was prepared with Dragon dictation along with smaller phrase technology. Any transcriptional errors that result from this process are unintentional.

## 2017-05-13 NOTE — Assessment & Plan Note (Signed)
Patient likely had an ulnar subluxation as well as potentially a radial head injury. Patient is doing much better at this time with some mild limitation in range of motion. Patient is encouraged to go to formal physical therapy to improve the range of motion. RTC in 4 weeks.

## 2017-05-18 NOTE — Progress Notes (Signed)
ADVANCED HF CLINIC NOTE  Patient ID: Darren Mueller, male   DOB: 1951/03/13, 66 y.o.   MRN: [id number] GI : Dr Christella Hartigan PCP: Dr Abner Greenspan  Subjective:    Mr. Schwoerer ("[name]") is a 66 yo with history of COPD, portopulmonary hypertension with RV failure, and ETOH cirrhosis.  Admitted in 6/17 with CP and SOB. Was diuresed. Troponins normal. Underwent R/L cath. Minimal CAD with moderate PAH and preserved cardiac output.   Admitted 06/2016 and 07/2016 with syncope.  On September admission found to have elevated ETOH level as well as R 6th rib fracture.   Wore 30 day monitory up until 08/28/16. No AF noted. No events.  9% tachycardia.   Admitted the end of February after syncopal event. LINQ placed.   Admitted 7/4 through 05/08/17 with volume depletion. Diuretics held and changed to as needed for weight 200 pounds or greater. Discharge weight was 194 pounds.   Today he return for post hospital follow up. Overall doing ok. He has not required any diuretics. Remains SOB with inclined surfaces.  Remains stressed out taking care of his parents. No fever or chills. No syncope/presyncope. Weight at  home 199-201 pounds. Lives with his adult son. Taking all medications.    PAH meds 1) Macitnentan 10 mg daily 2) Adcirca 40 mg daily  3) Selexapeg 1000/800 daily  Studies:  ECHO 12/14 EF 55-60% Peak PA pressure 35. Severe RV dysfunction  ECHO 7/15 EF 60% RV moderately to severely dilated. Moderate HK RVSP 57mm HG  ECHO 6/16 EF 60% RV moderately to severely dilated. Severe HK RVSP ~65 mm HG. D-shaped septum Echo 2/17 LVEF 60-65% RV massively dilated. Flat septum. Severe HK. Moderate TR RVSP ~65. IVC small. No effusion  ECHO 01/01/2017 EF 50-55% Grade I DD. RV severely dilated. Peak PA pressure 62 mm hg  Cath 6/17 Mid RCA lesion, 20% stenosed. Dist LAD lesion, 20% stenosed.  Ao = 107/70 (87) LV = 106/3/11 RA = 4 RV = 74/11 PA = 74/26 (45) PCW = 6 Fick cardiac output/index = 4.6.2.2 PVR =  8.5 Ao sat = 95% PA sat = 61%, 58%  RHC 2/17 RA = 11 RV = 80/16/19 PA = 83/61 (71) PCW = 9 Fick cardiac output/index = 5.6/2.6 PVR = 10.3 WU Ao sat = 94% PA sat = 65%, 68% High SVC sat = 65%  RHC 2/14: RA = 13  RV = 63/5/14  PA = 65/24 (41)  PCW = 6  Hepatic wedge = 21  Fick cardiac output/index = 3.8/1.8  PVR = 9.1  FA sat = 94%  PA sat = 58%, 56%  SVC = 54%  RHC 06/06/14 RA = 5  RV = 73/5/9  PA = 81/25 (46)  PCW = 7  Fick cardiac output/index = 6.0/2.8  PVR = 6.5 [place]  FA sat = 95%  PA sat = 71%, 67%  Unable to get hepatic wedge  PFTs 3/14 FEV1 2.02 L (58%) FVC 2.7L (54%) FEV1/FVC (89%) DLCO 53%  PFTs 1/17 FEV1 2.05 L (61%) FVC 2.48L (56%) DLCO 44%   Event Monitor - NSR 10/26/13.   Ab u/s 8/16: + cirrhosis/mild to moderate splenomegaly  6 min walk 01/10/13, 1290 feet  (01/10/14) = 1280 feet (380 m) (9/15) = 1350 feet (411 m) O2 sats ranged from 89-95% on room air, HR ranged from 100-129. (6/16) = 384 meters  (2/17) = 1250 feet (321m) (8/17) = 1320 feet      Current  Outpatient Prescriptions on File Prior to Encounter  Medication Sig Dispense Refill  . Albuterol Sulfate (PROAIR RESPICLICK) 108 (90 Base) MCG/ACT AEPB Inhale 2 puffs into the lungs every 6 (six) hours as needed (SOB or wheezing).    Marland Kitchen allopurinol (ZYLOPRIM) 100 MG tablet Take 1 tablet (100 mg total) by mouth daily. 90 tablet 1  . doxepin (SINEQUAN) 10 MG capsule Take 1-2 capsules (10-20 mg total) by mouth at bedtime as needed. 60 capsule 5  . Fluticasone Propionate 0.05 % LOTN Apply daily to affected area 3 Bottle 3  . fluticasone-salmeterol (ADVAIR HFA) 230-21 MCG/ACT inhaler Inhale 2 puffs into the lungs every morning.    . furosemide (LASIX) 40 MG tablet Take 40 mg (1 tab) once on Monday and Friday mornings. Only take if weight is 200 pounds or greater. 30 tablet 3  . levothyroxine (SYNTHROID, LEVOTHROID) 25 MCG tablet TAKE 1 TABLET DAILY 90 tablet 1  .  loperamide (ANTI-DIARRHEAL) 2 MG capsule Take 4 mg by mouth as needed for diarrhea or loose stools (up to 8 capsules per day).     Marland Kitchen losartan (COZAAR) 50 MG tablet Take 50 mg by mouth daily.    . Macitentan (OPSUMIT) 10 MG TABS Take 10 mg by mouth daily. 90 tablet 3  . Multiple Vitamin (MULTIVITAMIN WITH MINERALS) TABS tablet Take 1 tablet by mouth daily.    Marland Kitchen omeprazole (PRILOSEC) 40 MG capsule TAKE 1 CAPSULE DAILY 90 capsule 2  . potassium chloride (K-DUR,KLOR-CON) 10 MEQ tablet 2 tablets daily, extra 2 tablets on Monday and Friday only if you take lasix. 20 tablet 6  . rosuvastatin (CRESTOR) 5 MG tablet Take 1 tablet (5 mg total) by mouth daily. 30 tablet 3  . Selexipag (UPTRAVI) 1000 MCG TABS Take 1,000 mcg by mouth 2 (two) times daily. 180 tablet 3  . spironolactone (ALDACTONE) 50 MG tablet TAKE 1 TABLET DAILY 90 tablet 1  . SYMAX-SR 0.375 MG 12 hr tablet TAKE 1 TABLET TWICE A DAY 180 tablet 1  . tadalafil, PAH, (ADCIRCA) 20 MG tablet TAKE 2 TABLETS (40 MG TOTAL) DAILY 180 tablet 2   No current facility-administered medications on file prior to encounter.     Objective:   Weight Range:  Vital Signs:   Pulse Rate:  [84] 84 (07/17 1114) BP: (120)/(68) 120/68 (07/17 1114) SpO2:  [99 %] 99 % (07/17 1114) Weight:  [202 lb 6.4 oz (91.8 kg)] 202 lb 6.4 oz (91.8 kg) (07/17 1114)    Wt Readings from Last 3 Encounters:  05/19/17 202 lb 6.4 oz (91.8 kg)  05/13/17 201 lb (91.2 kg)  05/08/17 194 lb 1.6 oz (88 kg)    Physical Exam: General:  Well appearing. No resp difficulty HEENT: normal x for flushing Neck: supple. JVP 7 Carotids 2+ bilat; no bruits. No lymphadenopathy or thryomegaly appreciated. Cor: PMI nondisplaced. Regular rate & rhythm. 2/6 TR Loud P2 + RV lift  Lungs: clear with decreased BS throughout Abdomen: soft, nontender, nondistended. No hepatosplenomegaly. No bruits or masses. Good bowel sounds. Extremities: no cyanosis, clubbing, rash, edema Neuro: alert & orientedx3,  cranial nerves grossly intact. moves all 4 extremities w/o difficulty. Affect pleasant   Assessment/Plan     1. PAH: Suspected portopulmonary HTN in setting of ETOH cirrhosis.  Recent RHC with slightly improved hemodynamics  -Stable - Remains on triple therapy. Continue triple therapy-->macitentan 10 daily, adcirca 40 mg daily, and selexipag to 1000 mcg twice a day. Goal titration 1600 bid. Not sure he can tolerate  higher doses.  - Discussed need for complete ETOH cessation.  - Volume status stable. Continue lasix as needed for weight 200 pound or greater.  2. CKD III:  BMET today.  3. Cirrhosis:  - Likely combination of RV failure an ETOH - As above, again recommended complete cessation of ETOH.  - Follows with Dr. Christella Hartigan.  4. Syncope - LINQ placed 01/01/17 .  5. HTN:  - Stable.  6. OSA:  -Has mild OSA with AHI 12 and desats down to 82%.  - Intolerant CPAP.  7. Pancytopenia:  - Has also seen Dr. Clelia Croft in Hematology - felt like it is due to splenic sequestration.  8. CKD Stage IIi- check BMEt today.       Follow up in 3 months. BMET today.  Greater than 50% of the (total minutes 25 ) visit spent in counseling/coordination of care regarding PAH , diuretics and diet.    Jr Milliron, NP-C  11:17 AM

## 2017-05-19 ENCOUNTER — Encounter (HOSPITAL_COMMUNITY): Payer: Self-pay

## 2017-05-19 ENCOUNTER — Telehealth (HOSPITAL_COMMUNITY): Payer: Self-pay

## 2017-05-19 ENCOUNTER — Ambulatory Visit (HOSPITAL_COMMUNITY)
Admission: RE | Admit: 2017-05-19 | Discharge: 2017-05-19 | Disposition: A | Discharge status: Home / Self Care | Payer: Medicare Other | Source: Ambulatory Visit | Attending: Cardiology | Admitting: Cardiology

## 2017-05-19 VITALS — BP 120/68 | HR 84 | Wt 202.4 lb

## 2017-05-19 DIAGNOSIS — G4733 Obstructive sleep apnea (adult) (pediatric): Secondary | ICD-10-CM | POA: Insufficient documentation

## 2017-05-19 DIAGNOSIS — N183 Chronic kidney disease, stage 3 unspecified: Secondary | ICD-10-CM

## 2017-05-19 DIAGNOSIS — J449 Chronic obstructive pulmonary disease, unspecified: Secondary | ICD-10-CM | POA: Diagnosis not present

## 2017-05-19 DIAGNOSIS — K766 Portal hypertension: Secondary | ICD-10-CM | POA: Diagnosis not present

## 2017-05-19 DIAGNOSIS — R55 Syncope and collapse: Secondary | ICD-10-CM | POA: Insufficient documentation

## 2017-05-19 DIAGNOSIS — I10 Essential (primary) hypertension: Secondary | ICD-10-CM | POA: Diagnosis not present

## 2017-05-19 DIAGNOSIS — K7460 Unspecified cirrhosis of liver: Secondary | ICD-10-CM | POA: Insufficient documentation

## 2017-05-19 DIAGNOSIS — I2721 Secondary pulmonary arterial hypertension: Secondary | ICD-10-CM

## 2017-05-19 DIAGNOSIS — D61818 Other pancytopenia: Secondary | ICD-10-CM | POA: Insufficient documentation

## 2017-05-19 DIAGNOSIS — I129 Hypertensive chronic kidney disease with stage 1 through stage 4 chronic kidney disease, or unspecified chronic kidney disease: Secondary | ICD-10-CM | POA: Diagnosis not present

## 2017-05-19 DIAGNOSIS — I2729 Other secondary pulmonary hypertension: Secondary | ICD-10-CM

## 2017-05-19 DIAGNOSIS — I272 Pulmonary hypertension, unspecified: Secondary | ICD-10-CM | POA: Insufficient documentation

## 2017-05-19 DIAGNOSIS — I50810 Right heart failure, unspecified: Secondary | ICD-10-CM

## 2017-05-19 DIAGNOSIS — Z79899 Other long term (current) drug therapy: Secondary | ICD-10-CM | POA: Diagnosis not present

## 2017-05-19 LAB — BASIC METABOLIC PANEL
ANION GAP: 7 (ref 5–15)
BUN: 30 mg/dL — ABNORMAL HIGH (ref 6–20)
CALCIUM: 8.9 mg/dL (ref 8.9–10.3)
CO2: 20 mmol/L — AB (ref 22–32)
Chloride: 110 mmol/L (ref 101–111)
Creatinine, Ser: 1.76 mg/dL — ABNORMAL HIGH (ref 0.61–1.24)
GFR calc Af Amer: 44 mL/min — ABNORMAL LOW (ref >60)
GFR calc non Af Amer: 38 mL/min — ABNORMAL LOW (ref >60)
GLUCOSE: 109 mg/dL — AB (ref 65–99)
POTASSIUM: 4.4 mmol/L (ref 3.5–5.1)
Sodium: 137 mmol/L (ref 135–145)

## 2017-05-19 NOTE — Patient Instructions (Signed)
Labs today (will call for abnormal results, otherwise no news is good news)  Follow up in 3 Months

## 2017-05-19 NOTE — Telephone Encounter (Signed)
CHF Clinic appointment reminder call placed to patient for upcoming post-hospital follow up.  Does understand purpose of this appointment and where CHF Clinic is located? Patient aware of apt date, time, location, and reason for visit  How is patient feeling? Well, no complaints  Does patient have all of their medications since their recent discharge? Yes  Patient also reminded to take all medications as prescribed on the day of his/her appointment and to bring all medications to this appointment.  Advised to call our office for tardiness or cancellations/rescheduling needs.  Leory Plowman, Guinevere Ferrari

## 2017-05-21 ENCOUNTER — Ambulatory Visit: Payer: Medicare Other | Attending: Family Medicine | Admitting: Physical Therapy

## 2017-05-21 ENCOUNTER — Encounter: Payer: Self-pay | Admitting: Physical Therapy

## 2017-05-21 DIAGNOSIS — M25622 Stiffness of left elbow, not elsewhere classified: Secondary | ICD-10-CM | POA: Insufficient documentation

## 2017-05-21 DIAGNOSIS — M25522 Pain in left elbow: Secondary | ICD-10-CM | POA: Insufficient documentation

## 2017-05-21 DIAGNOSIS — M6281 Muscle weakness (generalized): Secondary | ICD-10-CM

## 2017-05-22 NOTE — Therapy (Signed)
[place], [place], [address] Phone: [phone]   Fax:  [phone]  Physical Therapy Evaluation  Patient Details  Name: Darren Mueller MRN: [id number] Date of Birth: 1951/04/30 Referring Provider: Dr Hulan Saas   Encounter Date: 05/21/2017        PT End of Session - 05/21/17 1111    Visit Number 1   Number of Visits 16   PT Start Time 1102   PT Stop Time 1145   PT Time Calculation (min) 43 min   Activity Tolerance Patient tolerated treatment well   Behavior During Therapy [facility] for tasks assessed/performed      Past Medical History:  Diagnosis Date  . Alcohol dependence ([place]) 03/22/2011   In remission   . Alcohol dependence in remission ([place]) 03/22/2011   In remission   . Alcoholic cirrhosis of liver with ascites ([place]) 2014  . Dermatitis 06/10/2015  . Diarrhea 09/04/2013  . Elbow pain, left 03/17/2017  . GERD (gastroesophageal reflux disease)   . Gout   . Heart murmur   . Hx of colonic polyps   . Hypertension   . Hypertriglyceridemia 03/17/2017  . Hypopotassemia   . Otalgia 11/28/2013  . Other chronic pulmonary heart diseases   . Preventative health care 06/29/2016  . Red eye 03/03/2016   right  . Renal insufficiency 07/18/2013  . Seizures ([place])   . Sleep apnea    does not wear CPAP  . Thrombocytopenia ([place]) 06/29/2016  . Unspecified hypothyroidism 01/25/2013  . Unspecified pleural effusion     Past Surgical History:  Procedure Laterality Date  . CARDIAC CATHETERIZATION N/A 12/21/2015   Procedure: Right Heart Cath;  Surgeon: Jolaine Artist, MD;  Location: [facility];  Service: Cardiovascular;  Laterality: N/A;  . CARDIAC CATHETERIZATION N/A 04/15/2016   Procedure: Right/Left Heart Cath and Coronary Angiography;  Surgeon: Jolaine Artist, MD;  Location: [facility] LAB;  Service: Cardiovascular;  Laterality: N/A;  . COLONOSCOPY N/A 12/08/2013   Procedure: COLONOSCOPY;   Surgeon: Milus Banister, MD;  Location: [facility] ENDOSCOPY;  Service: Endoscopy;  Laterality: N/A;  . ESOPHAGOGASTRODUODENOSCOPY (EGD) WITH PROPOFOL N/A 03/27/2016   Procedure: ESOPHAGOGASTRODUODENOSCOPY (EGD) WITH PROPOFOL;  Surgeon: Milus Banister, MD;  Location: [facility];  Service: Endoscopy;  Laterality: N/A;  . LOOP RECORDER INSERTION N/A 01/01/2017   Procedure: Loop Recorder Insertion;  Surgeon: Thompson Grayer, MD;  Location: [facility] CV LAB;  Service: Cardiovascular;  Laterality: N/A;  . RIGHT HEART CATHETERIZATION Right 06/06/2014   Procedure: RIGHT HEART CATH;  Surgeon: Jolaine Artist, MD;  Location: [facility] CATH LAB;  Service: Cardiovascular;  Laterality: Right;  . UVULOPALATOPHARYNGOPLASTY  1999    There were no vitals filed for this visit.     Subjective: Patient reports about 2 months ago he was helping transfer his parent when he began to have left elbow pain. He has over time began to loose flexion and extension motion. He is left handed. He is retired.    Pain 4/10  Location medial elbow and medial bicpes  Aching pain  aggrevating factors: use of the elbow  Improving factors: rest and ice Acute pain  > 1 month time period        [facility] PT Assessment - 05/22/17 0001      Assessment   Medical Diagnosis Left elbow pain   Referring Provider Dr Hulan Saas    Onset Date/Surgical Date --  2 months    Hand Dominance  Left   Next MD Visit 06/10/2017   Prior Therapy None      Precautions   Precautions Other (comment)   Precaution Comments No lifting over 10 lbs      Balance Screen   Has the patient fallen in the past 6 months No   Has the patient had a decrease in activity level because of a fear of falling?  No   Is the patient reluctant to leave their home because of a fear of falling?  No     Prior Function   Level of Independence Independent   Vocation Retired   Leisure limited 2nd to cardiac condition      Cognition   Overall Cognitive Status Within Functional  Limits for tasks assessed   Attention Focused   Focused Attention Appears intact   Memory Appears intact   Awareness Appears intact   Problem Solving Appears intact     Observation/Other Assessments   Focus on Therapeutic Outcomes (FOTO)  41% limitation      Observation/Other Assessments-Edema    Edema --  No visable edema noted      Sensation   Light Touch Appears Intact   Additional Comments Denies parathesias      Coordination   Gross Motor Movements are Fluid and Coordinated Yes   Fine Motor Movements are Fluid and Coordinated Yes     Posture/Postural Control   Posture Comments Rounded shoulders      AROM   Left Elbow Flexion 95   Left Elbow Extension --  can strighten elbow but needs to pronate to get straight      PROM   Overall PROM Comments full wrist motion    Left Elbow Flexion 106  with pain    Left Elbow Extension --  30 degrees from staright with full supination      Strength   Overall Strength Comments full wirst strenght left: Grip right 60 left 40 average of 2x ; Shoulder strength WNL    Left Elbow Flexion 3+/5   Left Elbow Extension 3+/5     Palpation   Palpation comment tenderness to palpation in the medial epicondyle and medial biceps            Objective measurements completed on examination: See above findings.          [facility] Adult PT Treatment/Exercise - 05/22/17 0001      Elbow Exercises   Other elbow exercises wrist flexion stretch 3x20 sec hold; Bicpes flexion stretch with with towel 2x20 second hold; Towel grip 2x10; self supination stretch 3x15 second holds.      Modalities   Modalities Cryotherapy     Manual Therapy   Manual therapy comments Ulnar and radial glides to improve flexion and extension; supination streting with radial glides; Soft tissue mobilization to the medial epicondyle and biceps.                 PT Education - 05/22/17 1010    Education provided Yes   Education Details self soft tissue  mobilization, HEP; stmptom mangement   Person(s) Educated Patient   Methods Explanation;Demonstration;Tactile cues;Verbal cues   Comprehension Verbalized understanding;Returned demonstration;Verbal cues required;Tactile cues required          PT Short Term Goals - 05/22/17 1026      PT SHORT TERM GOAL #1   Title Patient will increase his passive left elbow flexion by 15 degrees without pain    Time 4   Period Weeks  Status New     PT SHORT TERM GOAL #2   Title Patient will increase his passive left elbow extnesion to full with full supination    Time 4   Period Weeks   Status New     PT SHORT TERM GOAL #3   Title Patient will increase his left grip strength by 15 lbs    Time 4   Period Weeks   Status New     PT SHORT TERM GOAL #4   Title Patient will report decreased tenderness to palaption in his bicpes and medial elbow    Time 4   Period Weeks   Status New           PT Long Term Goals - 05/22/17 1028      PT LONG TERM GOAL #1   Title Patient will be independent with exercise program for dominant arm.    Time 4   Period Weeks   Status New     PT LONG TERM GOAL #2   Title Patient will increase gross left elbow strength to 5/5 in order to perform ADL's without pain   Time 4   Period Weeks   Status New     PT LONG TERM GOAL #3   Title Patient will demsotrate a 26% limitation in FOTO.                 Plan - 05/22/17 1011    Clinical Impression Statement Patient is a 66 year old male who presents with medial elbow and medial bicpes pain. Signs and symtoms are consitent with medial epiondylitis and likely bicpes tendiniits. He has limited elbow flexion and extension, motion and strength. He has tenderness to palpation in the medial elbow and the medial beceps. He has limited grip strength on the left which is his dominant hand. He would benefit from skilled therapy to improve function with his dominant arm.     History and Personal Factors relevant to  plan of care: recent cardiac history   Clinical Presentation Stable   Clinical Decision Making Low   Rehab Potential Good   PT Frequency 2x / week   PT Duration 8 weeks   PT Treatment/Interventions ADLs/Self Care Home Management;Cryotherapy;Electrical Stimulation;Stair training;Gait training;Patient/family education;Therapeutic exercise;Therapeutic activities;Manual techniques;Passive range of motion;Dry needling;Taping;Splinting   PT Next Visit Plan review HEP; assess motion of the elbow; manual therapy to improve elbow flexio, extension and supination, consider ultraspund and ionto   PT Home Exercise Plan wrist flexion stretch; elbow flexion stretch with towel, towel squeeze, elbow supination stretch   Consulted and Agree with Plan of Care Patient      Patient will benefit from skilled therapeutic intervention in order to improve the following deficits and impairments:  Decreased strength, Pain, Decreased range of motion, Impaired flexibility, Decreased activity tolerance  Visit Diagnosis: Pain in left elbow - Plan: PT plan of care cert/re-cert  Stiffness of left elbow, not elsewhere classified - Plan: PT plan of care cert/re-cert  Muscle weakness (generalized) - Plan: PT plan of care cert/re-cert      G-Codes - 37/10/62 1034    Functional Assessment Tool Used (Outpatient Only) FOTO, clinical decision making    Functional Limitation Carrying, moving and handling objects   Carrying, Moving and Handling Objects Current Status (I9485) At least 40 percent but less than 60 percent impaired, limited or restricted   Carrying, Moving and Handling Objects Goal Status (I6270) At least 20 percent but less than 40 percent impaired, limited or  restricted       Problem List Patient Active Problem List   Diagnosis Date Noted  . RUQ pain 05/06/2017  . Decreased range of motion of left elbow 04/15/2017  . Hypertriglyceridemia 03/17/2017  . Elbow pain, left 03/17/2017  . Right rib fracture    . Syncope 07/19/2016  . Thrombocytopenia ([place]) 06/29/2016  . Hoarseness, chronic 06/29/2016  . Preventative health care 06/29/2016  . Midline low back pain without sciatica 12/09/2015  . Cirrhosis with alcoholism ([place]) 08/08/2015  . Pancytopenia ([place]) 06/10/2015  . Dermatitis 06/10/2015  . OSA (obstructive sleep apnea) 02/13/2015  . Gout 07/30/2014  . Diarrhea 09/04/2013  . Chronic renal insufficiency, stage III (moderate) 07/18/2013  . Right heart failure due to pulmonary hypertension ([place]) 06/06/2013  . Hypothyroidism 01/25/2013  . PAH (pulmonary arterial hypertension) with portal hypertension ([place]) 01/21/2013  . Allergic rhinitis 12/21/2012  . Secondary pulmonary hypertension 12/09/2012  . Hepatic cirrhosis ([place]) 11/26/2012  . COPD bronchitis 11/01/2012  . GERD (gastroesophageal reflux disease) 09/05/2011  . Hypokalemia 08/29/2011  . Colon polyps 03/26/2011  . Insomnia 03/26/2011  . Hypertension 03/22/2011  . Alcohol dependence ([place]) 03/22/2011    Carney Living  PT DPT  05/22/2017, 11:31 AM  [facility] [address] [place], [place], [address] Phone: [phone]   Fax:  [phone]  Name: Darren Mueller MRN: [id number] Date of Birth: [birth year]

## 2017-06-01 ENCOUNTER — Ambulatory Visit (INDEPENDENT_AMBULATORY_CARE_PROVIDER_SITE_OTHER): Payer: Medicare Other | Admitting: *Deleted

## 2017-06-01 DIAGNOSIS — R55 Syncope and collapse: Secondary | ICD-10-CM

## 2017-06-01 NOTE — Progress Notes (Signed)
Carelink Summary Report / Loop Recorder 

## 2017-06-01 NOTE — Addendum Note (Signed)
Encounter addended by: Darrick Grinder D, NP on: 06/01/2017  1:37 PM<BR>    Actions taken: LOS modified, Follow-up modified

## 2017-06-02 ENCOUNTER — Encounter: Payer: Self-pay | Admitting: Physical Therapy

## 2017-06-02 ENCOUNTER — Ambulatory Visit: Payer: Medicare Other | Admitting: Physical Therapy

## 2017-06-02 DIAGNOSIS — M25622 Stiffness of left elbow, not elsewhere classified: Secondary | ICD-10-CM

## 2017-06-02 DIAGNOSIS — M25522 Pain in left elbow: Secondary | ICD-10-CM | POA: Diagnosis not present

## 2017-06-02 DIAGNOSIS — M6281 Muscle weakness (generalized): Secondary | ICD-10-CM | POA: Diagnosis not present

## 2017-06-02 NOTE — Therapy (Signed)
[place], [place], [address] Phone: [phone]   Fax:  [phone]  Physical Therapy Treatment  Patient Details  Name: Darren Mueller MRN: [id number] Date of Birth: 13-Feb-1951 Referring Provider: Dr Hulan Saas   Encounter Date: 06/02/2017      PT End of Session - 06/02/17 1243    Visit Number 2   Number of Visits 16   PT Start Time 0930   PT Stop Time 1020   PT Time Calculation (min) 50 min   Activity Tolerance Patient tolerated treatment well   Behavior During Therapy [facility] for tasks assessed/performed      Past Medical History:  Diagnosis Date  . Alcohol dependence ([place]) 03/22/2011   In remission   . Alcohol dependence in remission ([place]) 03/22/2011   In remission   . Alcoholic cirrhosis of liver with ascites ([place]) 2014  . Dermatitis 06/10/2015  . Diarrhea 09/04/2013  . Elbow pain, left 03/17/2017  . GERD (gastroesophageal reflux disease)   . Gout   . Heart murmur   . Hx of colonic polyps   . Hypertension   . Hypertriglyceridemia 03/17/2017  . Hypopotassemia   . Otalgia 11/28/2013  . Other chronic pulmonary heart diseases   . Preventative health care 06/29/2016  . Red eye 03/03/2016   right  . Renal insufficiency 07/18/2013  . Seizures ([place])   . Sleep apnea    does not wear CPAP  . Thrombocytopenia ([place]) 06/29/2016  . Unspecified hypothyroidism 01/25/2013  . Unspecified pleural effusion     Past Surgical History:  Procedure Laterality Date  . CARDIAC CATHETERIZATION N/A 12/21/2015   Procedure: Right Heart Cath;  Surgeon: Jolaine Artist, MD;  Location: [place] [facility];  Service: Cardiovascular;  Laterality: N/A;  . CARDIAC CATHETERIZATION N/A 04/15/2016   Procedure: Right/Left Heart Cath and Coronary Angiography;  Surgeon: Jolaine Artist, MD;  Location: [place] [facility];  Service: Cardiovascular;  Laterality: N/A;  . COLONOSCOPY N/A 12/08/2013   Procedure: COLONOSCOPY;  Surgeon:  Milus Banister, MD;  Location: [facility];  Service: Endoscopy;  Laterality: N/A;  . ESOPHAGOGASTRODUODENOSCOPY (EGD) WITH PROPOFOL N/A 03/27/2016   Procedure: ESOPHAGOGASTRODUODENOSCOPY (EGD) WITH PROPOFOL;  Surgeon: Milus Banister, MD;  Location: [place];  Service: Endoscopy;  Laterality: N/A;  . LOOP RECORDER INSERTION N/A 01/01/2017   Procedure: Loop Recorder Insertion;  Surgeon: Thompson Grayer, MD;  Location: [place] [facility];  Service: Cardiovascular;  Laterality: N/A;  . RIGHT HEART CATHETERIZATION Right 06/06/2014   Procedure: RIGHT HEART CATH;  Surgeon: Jolaine Artist, MD;  Location: [facility] CATH LAB;  Service: Cardiovascular;  Laterality: Right;  . UVULOPALATOPHARYNGOPLASTY  1999    There were no vitals filed for this visit.      Subjective Assessment - 06/02/17 1241    Subjective Patient arriving today reporting 3-4/10 pain in left elbow. Pt reporting difficulty reaching his head to perform ADL's.    Patient Stated Goals Stop hurting, reach my head to fix my hair   Currently in Pain? Yes   Pain Score 4    Pain Location Elbow   Pain Orientation Left   Pain Descriptors / Indicators Aching;Nagging   Pain Type Acute pain   Pain Onset More than a month ago   Pain Frequency Constant   Aggravating Factors  using left elbow   Pain Relieving Factors rest   Effect of Pain on Daily Activities difficutly using his left elbow  [facility] Adult PT Treatment/Exercise - 06/02/17 0001      Elbow Exercises   Other elbow exercises wrist flexion stretch 3x20 sec hold; Bicpes flexion stretch with with towel 2x20 second hold; Towel grip 2x10; self supination stretch 3x15 second holds.      Modalities   Modalities Electrical Stimulation;Moist Heat     Moist Heat Therapy   Number Minutes Moist Heat 15 Minutes   Moist Heat Location Elbow     Electrical Stimulation   Electrical Stimulation Location left bicep   Electrical Stimulation Action IFC    Electrical Stimulation Parameters 80-150 Hz, x 15 minutes, intensity to pt's tolerance   Electrical Stimulation Goals Pain     Manual Therapy   Manual therapy comments Ulnar and radial glides to improve flexion and extension; supination with radial glides; Soft tissue mobilization to biceps and triceps                PT Education - 06/02/17 1243    Education provided Yes   Education Details Reviewed HEP   Person(s) Educated Patient   Methods Explanation;Demonstration   Comprehension Verbalized understanding;Returned demonstration          PT Short Term Goals - 06/02/17 1247      PT SHORT TERM GOAL #1   Title Patient will increase his passive left elbow flexion by 15 degrees without pain    Status New     PT SHORT TERM GOAL #2   Title Patient will increase his passive left elbow extnesion to full with full supination    Time 4   Period Weeks   Status New     PT SHORT TERM GOAL #3   Title Patient will increase his left grip strength by 15 lbs    Time 4   Period Weeks   Status New     PT SHORT TERM GOAL #4   Title Patient will report decreased tenderness to palaption in his bicpes and medial elbow    Time 4   Period Weeks   Status New           PT Long Term Goals - 05/22/17 1028      PT LONG TERM GOAL #1   Title Patient will be independent with exercise program for dominant arm.    Time 4   Period Weeks   Status New     PT LONG TERM GOAL #2   Title Patient will increase gross left elbow strength to 5/5 in order to perform ADL's without pain   Time 4   Period Weeks   Status New     PT LONG TERM GOAL #3   Title Patient will demsotrate a 26% limitation in FOTO.                Plan - 06/02/17 1243    Clinical Impression Statement Pt arriving today reporting pain with bending his left elbow and reaching overhead. Pt with bicep tenderness and limited ROM. Pt reviewed all his HEP and tolerated STW to left elbow and bicep. PROM performed as well  as AROM. E-stim tolerated well at end of session. Continue with skilled PT to proress toward pt's goals.    History and Personal Factors relevant to plan of care: CHF, followed at heart failure clinic    Rehab Potential Good   PT Frequency 2x / week   PT Duration 8 weeks   PT Treatment/Interventions ADLs/Self Care Home Management;Cryotherapy;Electrical Stimulation;Stair training;Gait training;Patient/family education;Therapeutic exercise;Therapeutic activities;Manual techniques;Passive range of  motion;Dry needling;Taping;Splinting   PT Next Visit Plan review HEP; assess motion of the elbow; manual therapy to improve elbow flexion, extension and supination, E-stim   PT Home Exercise Plan wrist flexion stretch; elbow flexion stretch with towel, towel squeeze, elbow supination stretch   Consulted and Agree with Plan of Care Patient      Patient will benefit from skilled therapeutic intervention in order to improve the following deficits and impairments:  Decreased strength, Pain, Decreased range of motion, Impaired flexibility, Decreased activity tolerance  Visit Diagnosis: Pain in left elbow  Stiffness of left elbow, not elsewhere classified  Muscle weakness (generalized)     Problem List Patient Active Problem List   Diagnosis Date Noted  . RUQ pain 05/06/2017  . Decreased range of motion of left elbow 04/15/2017  . Hypertriglyceridemia 03/17/2017  . Elbow pain, left 03/17/2017  . Right rib fracture   . Syncope 07/19/2016  . Thrombocytopenia ([place]) 06/29/2016  . Hoarseness, chronic 06/29/2016  . Preventative health care 06/29/2016  . Midline low back pain without sciatica 12/09/2015  . Cirrhosis with alcoholism ([place]) 08/08/2015  . Pancytopenia ([place]) 06/10/2015  . Dermatitis 06/10/2015  . OSA (obstructive sleep apnea) 02/13/2015  . Gout 07/30/2014  . Diarrhea 09/04/2013  . Chronic renal insufficiency, stage III (moderate) 07/18/2013  . Right heart failure due to pulmonary  hypertension ([place]) 06/06/2013  . Hypothyroidism 01/25/2013  . PAH (pulmonary arterial hypertension) with portal hypertension ([place]) 01/21/2013  . Allergic rhinitis 12/21/2012  . Secondary pulmonary hypertension 12/09/2012  . Hepatic cirrhosis ([place]) 11/26/2012  . COPD bronchitis 11/01/2012  . GERD (gastroesophageal reflux disease) 09/05/2011  . Hypokalemia 08/29/2011  . Colon polyps 03/26/2011  . Insomnia 03/26/2011  . Hypertension 03/22/2011  . Alcohol dependence ([place]) 03/22/2011    Oretha Caprice, MPT 06/02/2017, 12:53 PM  [facility] [address] [place], [place], [address] Phone: [phone]   Fax:  [phone]  Name: Darren Mueller MRN: [id number] Date of Birth: 12-06-1950

## 2017-06-03 ENCOUNTER — Other Ambulatory Visit (HOSPITAL_COMMUNITY): Payer: Self-pay | Admitting: Adult Health

## 2017-06-04 ENCOUNTER — Ambulatory Visit: Payer: Medicare Other | Attending: Family Medicine | Admitting: Physical Therapy

## 2017-06-04 ENCOUNTER — Encounter: Payer: Self-pay | Admitting: Physical Therapy

## 2017-06-04 DIAGNOSIS — M25522 Pain in left elbow: Secondary | ICD-10-CM | POA: Diagnosis not present

## 2017-06-04 DIAGNOSIS — M6281 Muscle weakness (generalized): Secondary | ICD-10-CM | POA: Diagnosis not present

## 2017-06-04 DIAGNOSIS — M25622 Stiffness of left elbow, not elsewhere classified: Secondary | ICD-10-CM

## 2017-06-05 ENCOUNTER — Encounter (HOSPITAL_COMMUNITY): Payer: Medicare Other | Admitting: Internal Medicine

## 2017-06-05 ENCOUNTER — Encounter: Payer: Self-pay | Admitting: Physical Therapy

## 2017-06-05 NOTE — Therapy (Signed)
[place], [place], [address] Phone: [phone]   Fax:  [phone]  Physical Therapy Treatment  Patient Details  Name: Darren Mueller MRN: [id number] Date of Birth: January 14, 1951 Referring Provider: Dr Hulan Saas   Encounter Date: 06/04/2017      PT End of Session - 06/05/17 0800    Visit Number 3   Number of Visits 16   PT Start Time 1500   PT Stop Time 1543   PT Time Calculation (min) 43 min   Activity Tolerance Patient tolerated treatment well   Behavior During Therapy [facility] for tasks assessed/performed      Past Medical History:  Diagnosis Date  . Alcohol dependence ([place]) 03/22/2011   In remission   . Alcohol dependence in remission ([place]) 03/22/2011   In remission   . Alcoholic cirrhosis of liver with ascites ([place]) 2014  . Dermatitis 06/10/2015  . Diarrhea 09/04/2013  . Elbow pain, left 03/17/2017  . GERD (gastroesophageal reflux disease)   . Gout   . Heart murmur   . Hx of colonic polyps   . Hypertension   . Hypertriglyceridemia 03/17/2017  . Hypopotassemia   . Otalgia 11/28/2013  . Other chronic pulmonary heart diseases   . Preventative health care 06/29/2016  . Red eye 03/03/2016   right  . Renal insufficiency 07/18/2013  . Seizures ([place])   . Sleep apnea    does not wear CPAP  . Thrombocytopenia ([place]) 06/29/2016  . Unspecified hypothyroidism 01/25/2013  . Unspecified pleural effusion     Past Surgical History:  Procedure Laterality Date  . CARDIAC CATHETERIZATION N/A 12/21/2015   Procedure: Right Heart Cath;  Surgeon: Jolaine Artist, MD;  Location: [place] [facility];  Service: Cardiovascular;  Laterality: N/A;  . CARDIAC CATHETERIZATION N/A 04/15/2016   Procedure: Right/Left Heart Cath and Coronary Angiography;  Surgeon: Jolaine Artist, MD;  Location: [place] [facility];  Service: Cardiovascular;  Laterality: N/A;  . COLONOSCOPY N/A 12/08/2013   Procedure: COLONOSCOPY;  Surgeon:  Milus Banister, MD;  [facility]: [facility];  Service: Endoscopy;  Laterality: N/A;  . ESOPHAGOGASTRODUODENOSCOPY (EGD) WITH PROPOFOL N/A 03/27/2016   Procedure: ESOPHAGOGASTRODUODENOSCOPY (EGD) WITH PROPOFOL;  Surgeon: Milus Banister, MD;  Location: [place];  Service: Endoscopy;  Laterality: N/A;  . LOOP RECORDER INSERTION N/A 01/01/2017   Procedure: Loop Recorder Insertion;  Surgeon: Thompson Grayer, MD;  Location: [place] [facility];  Service: Cardiovascular;  Laterality: N/A;  . RIGHT HEART CATHETERIZATION Right 06/06/2014   Procedure: RIGHT HEART CATH;  Surgeon: Jolaine Artist, MD;  Location: [facility] CATH LAB;  Service: Cardiovascular;  Laterality: Right;  . UVULOPALATOPHARYNGOPLASTY  1999    There were no vitals filed for this visit.      Subjective Assessment - 06/04/17 1504    Subjective Patient reports he feels like it is getting a little better. He is still having trouble with his bend. When he bends to reach over his head is when he has the most trouble.    Limitations Standing;Walking   Patient Stated Goals Stop hurting, reach my head to fix my hair   Currently in Pain? Yes   Pain Score 3    Pain Location Elbow   Pain Orientation Left   Pain Descriptors / Indicators Aching;Nagging   Pain Type Acute pain   Pain Radiating Towards into the hand    Pain Onset More than a month ago   Pain Frequency Constant   Aggravating Factors  using the left elbow    Pain Relieving Factors rest    Effect of Pain on Daily Activities difficulty using the left elbow                          [facility] Adult PT Treatment/Exercise - 06/05/17 0001      Elbow Exercises   Other elbow exercises wrist flexion stretch 3x20 sec hold; Bicpes flexion stretch with with towel 2x20 second hold; Towel grip 2x10; self supination stretch 3x15 second holds.    Other elbow exercises supination 2lb weight;      Modalities   Modalities Iontophoresis     Iontophoresis   Type of Iontophoresis  Dexamethasone   Location medial bicpes    Dose 1cc    Time 4-6 hours      Manual Therapy   Manual therapy comments Ulnar and radial glides to improve flexion and extension; supination streting with radial glides; Soft tissue mobilization to the medial epicondyle and biceps; mulligan flexion mobilization; IASTYM to lateral bicpes.                 PT Education - 06/05/17 0759    Education provided Yes   Education Details continue with HEP    Person(s) Educated Patient   Methods Explanation;Demonstration   Comprehension Verbalized understanding;Returned demonstration          PT Short Term Goals - 06/02/17 1247      PT SHORT TERM GOAL #1   Title Patient will increase his passive left elbow flexion by 15 degrees without pain    Status New     PT SHORT TERM GOAL #2   Title Patient will increase his passive left elbow extnesion to full with full supination    Time 4   Period Weeks   Status New     PT SHORT TERM GOAL #3   Title Patient will increase his left grip strength by 15 lbs    Time 4   Period Weeks   Status New     PT SHORT TERM GOAL #4   Title Patient will report decreased tenderness to palaption in his bicpes and medial elbow    Time 4   Period Weeks   Status New           PT Long Term Goals - 05/22/17 1028      PT LONG TERM GOAL #1   Title Patient will be independent with exercise program for dominant arm.    Time 4   Period Weeks   Status New     PT LONG TERM GOAL #2   Title Patient will increase gross left elbow strength to 5/5 in order to perform ADL's without pain   Time 4   Period Weeks   Status New     PT LONG TERM GOAL #3   Title Patient will demsotrate a 26% limitation in FOTO.                Plan - 06/05/17 0805    Clinical Impression Statement Patient tolerated treatment well. He was sore after the treatment in his lateral bicpes so tionto was performed. He continues to have pain with end range bicpes flexion with  supination. Therapy worked on [employer] to [employer]. He had a slight improvement. He is not having medial elbow pain today.    History and Personal Factors relevant to plan of care: CHF    Clinical Presentation Stable  Clinical Decision Making Low   Rehab Potential Good   PT Frequency 2x / week   PT Duration 8 weeks   PT Treatment/Interventions ADLs/Self Care Home Management;Cryotherapy;Electrical Stimulation;Stair training;Gait training;Patient/family education;Therapeutic exercise;Therapeutic activities;Manual techniques;Passive range of motion;Dry needling;Taping;Splinting   PT Next Visit Plan review HEP; assess motion of the elbow; manual therapy to improve elbow flexion, extension and supination, E-stim   PT Home Exercise Plan wrist flexion stretch; elbow flexion stretch with towel, towel squeeze, elbow supination stretch   Consulted and Agree with Plan of Care Patient      Patient will benefit from skilled therapeutic intervention in order to improve the following deficits and impairments:  Decreased strength, Pain, Decreased range of motion, Impaired flexibility, Decreased activity tolerance  Visit Diagnosis: Pain in left elbow  Stiffness of left elbow, not elsewhere classified  Muscle weakness (generalized)     Problem List Patient Active Problem List   Diagnosis Date Noted  . RUQ pain 05/06/2017  . Decreased range of motion of left elbow 04/15/2017  . Hypertriglyceridemia 03/17/2017  . Elbow pain, left 03/17/2017  . Right rib fracture   . Syncope 07/19/2016  . Thrombocytopenia ([place]) 06/29/2016  . Hoarseness, chronic 06/29/2016  . Preventative health care 06/29/2016  . Midline low back pain without sciatica 12/09/2015  . Cirrhosis with alcoholism ([place]) 08/08/2015  . Pancytopenia ([place]) 06/10/2015  . Dermatitis 06/10/2015  . OSA (obstructive sleep apnea) 02/13/2015  . Gout 07/30/2014  . Diarrhea 09/04/2013  . Chronic renal insufficiency, stage  III (moderate) 07/18/2013  . Right heart failure due to pulmonary hypertension ([place]) 06/06/2013  . Hypothyroidism 01/25/2013  . PAH (pulmonary arterial hypertension) with portal hypertension ([place]) 01/21/2013  . Allergic rhinitis 12/21/2012  . Secondary pulmonary hypertension 12/09/2012  . Hepatic cirrhosis ([place]) 11/26/2012  . COPD bronchitis 11/01/2012  . GERD (gastroesophageal reflux disease) 09/05/2011  . Hypokalemia 08/29/2011  . Colon polyps 03/26/2011  . Insomnia 03/26/2011  . Hypertension 03/22/2011  . Alcohol dependence ([place]) 03/22/2011    Carney Living PT DPT  06/05/2017, 8:35 AM  [facility] [address] [place], [place], [address] Phone: [phone]   Fax:  [phone]  Name: Darren Mueller MRN: [id number] Date of Birth: 13-Oct-1951

## 2017-06-09 ENCOUNTER — Ambulatory Visit: Payer: Medicare Other | Admitting: Physical Therapy

## 2017-06-09 ENCOUNTER — Encounter: Payer: Self-pay | Admitting: Physical Therapy

## 2017-06-09 DIAGNOSIS — M25622 Stiffness of left elbow, not elsewhere classified: Secondary | ICD-10-CM

## 2017-06-09 DIAGNOSIS — M25522 Pain in left elbow: Secondary | ICD-10-CM | POA: Diagnosis not present

## 2017-06-09 DIAGNOSIS — M6281 Muscle weakness (generalized): Secondary | ICD-10-CM

## 2017-06-10 ENCOUNTER — Encounter: Payer: Self-pay | Admitting: Family Medicine

## 2017-06-10 ENCOUNTER — Ambulatory Visit (INDEPENDENT_AMBULATORY_CARE_PROVIDER_SITE_OTHER): Payer: Medicare Other | Admitting: Family Medicine

## 2017-06-10 DIAGNOSIS — M25622 Stiffness of left elbow, not elsewhere classified: Secondary | ICD-10-CM

## 2017-06-10 MED FILL — ADCIRCA 20 MG TABLET: 20 | 30 days supply | Qty: 60 | Fill #2

## 2017-06-10 NOTE — Assessment & Plan Note (Signed)
Has improved in all range motion except for the flexion. Still do not see anything significant. X-rays did not show any significant bony normality. Patient will continue to work on the bicep tendon. Continue with physical therapy for a while longer. Discussed icing regimen. Follow-up again with me in 4-6 weeks

## 2017-06-10 NOTE — Patient Instructions (Signed)
Good to see you  We will continue to do what you are doing Keep pushing it a little bit trying to get full motion Go to PT as much as you feel you are seeing improvement See me again in 6 weeks.

## 2017-06-10 NOTE — Therapy (Signed)
[place], [place], [address] Phone: [phone]   Fax:  [phone]  Physical Therapy Treatment  Patient Details  Name: Darren Mueller MRN: [id number] Date of Birth: [birth year] Referring Provider: Dr Hulan Saas   Encounter Date: 06/09/2017      PT End of Session - 06/09/17 0924    Visit Number 4   Number of Visits 16   Date for PT Re-Evaluation 08/04/17   PT Start Time 0850   PT Stop Time 0930   PT Time Calculation (min) 40 min   Activity Tolerance Patient tolerated treatment well   Behavior During Therapy [facility] for tasks assessed/performed      Past Medical History:  Diagnosis Date  . Alcohol dependence ([place]) 03/22/2011   In remission   . Alcohol dependence in remission ([place]) 03/22/2011   In remission   . Alcoholic cirrhosis of liver with ascites ([place]) 2014  . Dermatitis 06/10/2015  . Diarrhea 09/04/2013  . Elbow pain, left 03/17/2017  . GERD (gastroesophageal reflux disease)   . Gout   . Heart murmur   . Hx of colonic polyps   . Hypertension   . Hypertriglyceridemia 03/17/2017  . Hypopotassemia   . Otalgia 11/28/2013  . Other chronic pulmonary heart diseases   . Preventative health care 06/29/2016  . Red eye 03/03/2016   right  . Renal insufficiency 07/18/2013  . Seizures ([place])   . Sleep apnea    does not wear CPAP  . Thrombocytopenia ([place]) 06/29/2016  . Unspecified hypothyroidism 01/25/2013  . Unspecified pleural effusion     Past Surgical History:  Procedure Laterality Date  . CARDIAC CATHETERIZATION N/A 12/21/2015   Procedure: Right Heart Cath;  Surgeon: Jolaine Artist, MD;  Location: [place] [facility];  Service: Cardiovascular;  Laterality: N/A;  . CARDIAC CATHETERIZATION N/A 04/15/2016   Procedure: Right/Left Heart Cath and Coronary Angiography;  Surgeon: Jolaine Artist, MD;  Location: [place] [facility];  Service: Cardiovascular;  Laterality: N/A;  . COLONOSCOPY N/A 12/08/2013   Procedure: COLONOSCOPY;  Surgeon: Milus Banister, MD;  Location: [facility];  Service: Endoscopy;  Laterality: N/A;  . ESOPHAGOGASTRODUODENOSCOPY (EGD) WITH PROPOFOL N/A 03/27/2016   Procedure: ESOPHAGOGASTRODUODENOSCOPY (EGD) WITH PROPOFOL;  Surgeon: Milus Banister, MD;  Location: [place];  Service: Endoscopy;  Laterality: N/A;  . LOOP RECORDER INSERTION N/A 01/01/2017   Procedure: Loop Recorder Insertion;  Surgeon: Thompson Grayer, MD;  Location: [place] [facility];  Service: Cardiovascular;  Laterality: N/A;  . RIGHT HEART CATHETERIZATION Right 06/06/2014   Procedure: RIGHT HEART CATH;  Surgeon: Jolaine Artist, MD;  Location: [facility] CATH LAB;  Service: Cardiovascular;  Laterality: Right;  . UVULOPALATOPHARYNGOPLASTY  1999    There were no vitals filed for this visit.      Subjective Assessment - 06/09/17 0923    Subjective Patient reports less pain when bending his elbow. He has been able to reach over his head with less pain as well. He feels like the patch helped.    Limitations Standing;Walking   Patient Stated Goals Stop hurting, reach my head to fix my hair   Currently in Pain? Yes   Pain Score 2    Pain Location Elbow   Pain Orientation Left   Pain Descriptors / Indicators Aching;Nagging   Pain Type Acute pain   Pain Onset More than a month ago   Aggravating Factors  using the left elbow    Pain Relieving Factors rest  Effect of Pain on Daily Activities difficulty using the elbow.                          [place] Adult PT Treatment/Exercise - 06/10/17 0001      Elbow Exercises   Other elbow exercises wrist flexion stretch 3x20 sec hold; Bicpes flexion stretch with with towel 2x20 second hold; Towel grip 2x10; self supination stretch 3x15 second holds. Scap retraction 2x10 red;     Other elbow exercises supination 3lb weight; elbow flexion 3lb 2x10; wrist flexion 3lb 2x10      Iontophoresis   Type of Iontophoresis Dexamethasone   Location medial biceps     Dose 1cc    Time 4-6 hours      Manual Therapy   Manual therapy comments Ulnar and radial glides to improve flexion and extension; supination streting with radial glides; Soft tissue mobilization to the medial epicondyle and biceps; mulligan flexion mobilization; IASTYM to lateral bicpes.                 PT Education - 06/09/17 [phone]    Education provided Yes   Education Details continue with HEP    Person(s) Educated Patient   Methods Explanation;Demonstration   Comprehension Verbalized understanding;Returned demonstration;Verbal cues required          PT Short Term Goals - 06/02/17 1247      PT SHORT TERM GOAL #1   Title Patient will increase his passive left elbow flexion by 15 degrees without pain    Status New     PT SHORT TERM GOAL #2   Title Patient will increase his passive left elbow extnesion to full with full supination    Time 4   Period Weeks   Status New     PT SHORT TERM GOAL #3   Title Patient will increase his left grip strength by 15 lbs    Time 4   Period Weeks   Status New     PT SHORT TERM GOAL #4   Title Patient will report decreased tenderness to palaption in his bicpes and medial elbow    Time 4   Period Weeks   Status New           PT Long Term Goals - 05/22/17 1028      PT LONG TERM GOAL #1   Title Patient will be independent with exercise program for dominant arm.    Time 4   Period Weeks   Status New     PT LONG TERM GOAL #2   Title Patient will increase gross left elbow strength to 5/5 in order to perform ADL's without pain   Time 4   Period Weeks   Status New     PT LONG TERM GOAL #3   Title Patient will demsotrate a 26% limitation in FOTO.                Plan - 06/09/17 1619    Clinical Impression Statement Patient continues to make progress. He is still having ERP with flexion still, but he reports improvement. He had less pain with IASTYM to bicpes today. Therapy continued with [place]. Therapy also  increased    Clinical Presentation Stable   Clinical Decision Making Low   Rehab Potential Good   PT Frequency 2x / week   PT Duration 8 weeks   PT Treatment/Interventions ADLs/Self Care Home Management;Cryotherapy;Electrical Stimulation;Stair training;Gait training;Patient/family education;Therapeutic exercise;Therapeutic activities;Manual techniques;Passive range of  motion;Dry needling;Taping;Splinting   PT Next Visit Plan review HEP; assess motion of the elbow; manual therapy to improve elbow flexion, extension and supination, E-stim   PT Home Exercise Plan wrist flexion stretch; elbow flexion stretch with towel, towel squeeze, elbow supination stretch   Consulted and Agree with Plan of Care Patient      Patient will benefit from skilled therapeutic intervention in order to improve the following deficits and impairments:  Decreased strength, Pain, Decreased range of motion, Impaired flexibility, Decreased activity tolerance  Visit Diagnosis: Pain in left elbow  Stiffness of left elbow, not elsewhere classified  Muscle weakness (generalized)     Problem List Patient Active Problem List   Diagnosis Date Noted  . RUQ pain 05/06/2017  . Decreased range of motion of left elbow 04/15/2017  . Hypertriglyceridemia 03/17/2017  . Elbow pain, left 03/17/2017  . Right rib fracture   . Syncope 07/19/2016  . Thrombocytopenia ([place]) 06/29/2016  . Hoarseness, chronic 06/29/2016  . Preventative health care 06/29/2016  . Midline low back pain without sciatica 12/09/2015  . Cirrhosis with alcoholism ([place]) 08/08/2015  . Pancytopenia ([place]) 06/10/2015  . Dermatitis 06/10/2015  . OSA (obstructive sleep apnea) 02/13/2015  . Gout 07/30/2014  . Diarrhea 09/04/2013  . Chronic renal insufficiency, stage III (moderate) 07/18/2013  . Right heart failure due to pulmonary hypertension ([place]) 06/06/2013  . Hypothyroidism 01/25/2013  . PAH (pulmonary arterial hypertension) with portal hypertension  ([place]) 01/21/2013  . Allergic rhinitis 12/21/2012  . Secondary pulmonary hypertension 12/09/2012  . Hepatic cirrhosis ([place]) 11/26/2012  . COPD bronchitis 11/01/2012  . GERD (gastroesophageal reflux disease) 09/05/2011  . Hypokalemia 08/29/2011  . Colon polyps 03/26/2011  . Insomnia 03/26/2011  . Hypertension 03/22/2011  . Alcohol dependence ([place]) 03/22/2011    Carney Living PT DPT  06/10/2017, 8:01 AM  [facility] [address] [place], [place], [address] Phone: [phone]   Fax:  [phone]  Name: Darren Mueller MRN: [id number] Date of Birth: August 07, 1951

## 2017-06-10 NOTE — Progress Notes (Signed)
[facility] [place], [place] [address] Phone: [phone] Subjective:    I'm seeing this patient by the request  of:  Mosie Lukes, MD   CC: Left elbow pain f/u  [id number]  Darren Mueller is a 66 y.o. male coming in with complaint of left elbow pain. Past medical history significant for gout. Found to have more of a bicep tendinitis. Had decreasing range of motion. Patient started with physical therapy. Does make improvement. States that he is 50-70% better. Still having some limited range of motion. States that the pain overall though is improved. No numbness or weakness.  Past Medical History:  Diagnosis Date  . Alcohol dependence ([place]) 03/22/2011   In remission   . Alcohol dependence in remission ([place]) 03/22/2011   In remission   . Alcoholic cirrhosis of liver with ascites ([place]) 2014  . Dermatitis 06/10/2015  . Diarrhea 09/04/2013  . Elbow pain, left 03/17/2017  . GERD (gastroesophageal reflux disease)   . Gout   . Heart murmur   . Hx of colonic polyps   . Hypertension   . Hypertriglyceridemia 03/17/2017  . Hypopotassemia   . Otalgia 11/28/2013  . Other chronic pulmonary heart diseases   . Preventative health care 06/29/2016  . Red eye 03/03/2016   right  . Renal insufficiency 07/18/2013  . Seizures ([place])   . Sleep apnea    does not wear CPAP  . Thrombocytopenia ([place]) 06/29/2016  . Unspecified hypothyroidism 01/25/2013  . Unspecified pleural effusion    Past Surgical History:  Procedure Laterality Date  . CARDIAC CATHETERIZATION N/A 12/21/2015   Procedure: Right Heart Cath;  Surgeon: Jolaine Artist, MD;  Location: [place] [facility];  Service: Cardiovascular;  Laterality: N/A;  . CARDIAC CATHETERIZATION N/A 04/15/2016   Procedure: Right/Left Heart Cath and Coronary Angiography;  Surgeon: Jolaine Artist, MD;  Location: [place] [facility];  Service: Cardiovascular;  Laterality: N/A;  . COLONOSCOPY N/A 12/08/2013   Procedure: COLONOSCOPY;  Surgeon: Milus Banister, MD;  [facility];  Service: Endoscopy;  Laterality: N/A;  . ESOPHAGOGASTRODUODENOSCOPY (EGD) WITH PROPOFOL N/A 03/27/2016   Procedure: ESOPHAGOGASTRODUODENOSCOPY (EGD) WITH PROPOFOL;  Surgeon: Milus Banister, MD;  Location: [place];  Service: Endoscopy;  Laterality: N/A;  . LOOP RECORDER INSERTION N/A 01/01/2017   Procedure: Loop Recorder Insertion;  Surgeon: Thompson Grayer, MD;  Location: [place] [facility];  Service: Cardiovascular;  Laterality: N/A;  . RIGHT HEART CATHETERIZATION Right 06/06/2014   Procedure: RIGHT HEART CATH;  Surgeon: Jolaine Artist, MD;  Location: [facility] CATH LAB;  Service: Cardiovascular;  Laterality: Right;  . UVULOPALATOPHARYNGOPLASTY  1999   Social History   Social History  . Marital status: Divorced    Spouse name: N/A  . Number of children: 3  . Years of education: N/A   Occupational History  . Retired     Social research officer, government   Social History Main Topics  . Smoking status: Former Smoker    Packs/day: 2.00    Years: 5.00    Types: Cigarettes    Quit date: 09/22/1979  . Smokeless tobacco: Never Used  . Alcohol use 3.6 oz/week    6 Shots of liquor per week     Comment: 12/31/2016 " "2 vodka & tonics on MWF"  . Drug use: No  . Sexual activity: Not Currently   Other Topics Concern  . None   Social History Narrative   0 caffeine drinks daily  Allergies  Allergen Reactions  . Aspirin Other (See Comments)    hypertension  . Nitroglycerin     Headache   Family History  Problem Relation Age of Onset  . Heart disease Mother   . Heart attack Mother   . Hypertension Mother   . Prostate cancer Father   . Colon cancer Neg Hx     Past medical history, social, surgical and family history all reviewed in electronic medical record.  No pertanent information unless stated regarding to the chief complaint.   Review of Systems: No headache, visual changes, nausea, vomiting, diarrhea, constipation,  dizziness, abdominal pain, skin rash, fevers, chills, night sweats, weight loss, swollen lymph nodes, body aches, joint swelling, muscle aches, chest pain, shortness of breath, mood changes.    Objective  Blood pressure 130/72, pulse 74, height 6' (1.829 m), weight 200 lb (90.7 kg).   Systems examined below as of 06/10/17 General: NAD A&O x3 mood, affect normal  HEENT: Pupils equal, extraocular movements intact no nystagmus Respiratory: not short of breath at rest or with speaking Cardiovascular: No lower extremity edema, non tender Skin: Warm dry intact with no signs of infection or rash on extremities or on axial skeleton. Abdomen: Soft nontender, no masses Neuro: Cranial nerves  intact, neurovascularly intact in all extremities with 2+ DTRs and 2+ pulses. Lymph: No lymphadenopathy appreciated today  Gait normal with good balance and coordination.  MSK: Non tender with full range of motion and good stability and symmetric strength and tone of shoulders, wrist,  knee hips and ankles bilaterally.  Arthritic changes of multiple joints Elbow:left  Unremarkable to inspection. . Still limited range of motion lacking the last 10 of flexion as well as full. Strength is full to all of the above directions Stable to varus, valgus stress. Negative moving valgus stress test. Tender at Ulnar nerve does not sublux. Negative cubital tunnel Tinel's.     Impression and Recommendations:     This case required medical decision making of moderate complexity.      Note: This dictation was prepared with Dragon dictation along with smaller phrase technology. Any transcriptional errors that result from this process are unintentional.

## 2017-06-11 ENCOUNTER — Ambulatory Visit: Payer: Medicare Other | Admitting: Physical Therapy

## 2017-06-11 DIAGNOSIS — M6281 Muscle weakness (generalized): Secondary | ICD-10-CM

## 2017-06-11 DIAGNOSIS — M25622 Stiffness of left elbow, not elsewhere classified: Secondary | ICD-10-CM

## 2017-06-11 DIAGNOSIS — M25522 Pain in left elbow: Secondary | ICD-10-CM

## 2017-06-11 NOTE — Therapy (Signed)
[place], [place], [address] Phone: [phone]   Fax:  [phone]  Physical Therapy Treatment  Patient Details  Name: Darren Mueller MRN: [id number] Date of Birth: 06-12-1951 Referring Provider: Dr Hulan Saas   Encounter Date: 06/11/2017      PT End of Session - 06/11/17 2055    Visit Number 5   Number of Visits 16   Date for PT Re-Evaluation 08/04/17   PT Start Time 1500   PT Stop Time 1540   PT Time Calculation (min) 40 min   Activity Tolerance Patient tolerated treatment well   Behavior During Therapy [facility] for tasks assessed/performed      Past Medical History:  Diagnosis Date  . Alcohol dependence ([place]) 03/22/2011   In remission   . Alcohol dependence in remission ([place]) 03/22/2011   In remission   . Alcoholic cirrhosis of liver with ascites ([place]) 2014  . Dermatitis 06/10/2015  . Diarrhea 09/04/2013  . Elbow pain, left 03/17/2017  . GERD (gastroesophageal reflux disease)   . Gout   . Heart murmur   . Hx of colonic polyps   . Hypertension   . Hypertriglyceridemia 03/17/2017  . Hypopotassemia   . Otalgia 11/28/2013  . Other chronic pulmonary heart diseases   . Preventative health care 06/29/2016  . Red eye 03/03/2016   right  . Renal insufficiency 07/18/2013  . Seizures ([place])   . Sleep apnea    does not wear CPAP  . Thrombocytopenia ([place]) 06/29/2016  . Unspecified hypothyroidism 01/25/2013  . Unspecified pleural effusion     Past Surgical History:  Procedure Laterality Date  . CARDIAC CATHETERIZATION N/A 12/21/2015   Procedure: Right Heart Cath;  Surgeon: Jolaine Artist, MD;  Location: [place] [facility];  Service: Cardiovascular;  Laterality: N/A;  . CARDIAC CATHETERIZATION N/A 04/15/2016   Procedure: Right/Left Heart Cath and Coronary Angiography;  Surgeon: Jolaine Artist, MD;  Location: [place] [facility];  Service: Cardiovascular;  Laterality: N/A;  . COLONOSCOPY N/A 12/08/2013    Procedure: COLONOSCOPY;  Surgeon: Milus Banister, MD;  [facility];  Service: Endoscopy;  Laterality: N/A;  . ESOPHAGOGASTRODUODENOSCOPY (EGD) WITH PROPOFOL N/A 03/27/2016   Procedure: ESOPHAGOGASTRODUODENOSCOPY (EGD) WITH PROPOFOL;  Surgeon: Milus Banister, MD;  Location: [place];  Service: Endoscopy;  Laterality: N/A;  . LOOP RECORDER INSERTION N/A 01/01/2017   Procedure: Loop Recorder Insertion;  Surgeon: Thompson Grayer, MD;  Location: [place] [facility];  Service: Cardiovascular;  Laterality: N/A;  . RIGHT HEART CATHETERIZATION Right 06/06/2014   Procedure: RIGHT HEART CATH;  Surgeon: Jolaine Artist, MD;  Location: [facility] CATH LAB;  Service: Cardiovascular;  Laterality: Right;  . UVULOPALATOPHARYNGOPLASTY  1999    There were no vitals filed for this visit.      Subjective Assessment - 06/11/17 2050    Subjective Patient reports his bicpes is getitng better. He continues to have pain when he is reaching behind his head but it is improving. He is doing his exercises.    Limitations Standing;Walking   Patient Stated Goals Stop hurting, reach my head to fix my hair   Currently in Pain? Yes   Pain Score 2    Pain Location Elbow   Pain Orientation Left   Pain Descriptors / Indicators Aching;Nagging   Pain Type Acute pain   Pain Onset More than a month ago   Pain Frequency Constant   Aggravating Factors  using the elbow    Pain  Relieving Factors rest    Effect of Pain on Daily Activities difficulty using his elbow                                  PT Education - 06/11/17 2054    Education provided Yes   Education Details improtance of supination stretching    Person(s) Educated Patient   Methods Explanation;Demonstration   Comprehension Verbalized understanding;Returned demonstration;Verbal cues required          PT Short Term Goals - 06/11/17 2100      PT SHORT TERM GOAL #1   Title Patient will increase his passive left elbow flexion by  15 degrees without pain    Time 4   Period Weeks   Status On-going     PT SHORT TERM GOAL #2   Title Patient will increase his passive left elbow extnesion to full with full supination    Time 4   Period Weeks   Status On-going     PT SHORT TERM GOAL #3   Title Patient will increase his left grip strength by 15 lbs    Time 4   Period Weeks   Status On-going     PT SHORT TERM GOAL #4   Title Patient will report decreased tenderness to palaption in his bicpes and medial elbow    Time 4   Period Weeks   Status On-going           PT Long Term Goals - 05/22/17 1028      PT LONG TERM GOAL #1   Title Patient will be independent with exercise program for dominant arm.    Time 4   Period Weeks   Status New     PT LONG TERM GOAL #2   Title Patient will increase gross left elbow strength to 5/5 in order to perform ADL's without pain   Time 4   Period Weeks   Status New     PT LONG TERM GOAL #3   Title Patient will demsotrate a 26% limitation in FOTO.                Plan - 06/11/17 2056    Clinical Impression Statement Patient tolerated increased weight with bipcesp curl and supination. He had a small red area from the prior ionto treatment. Therapy will monitor his skin. Conssider ultrasound next visit to continue to improve healing.    Clinical Presentation Evolving   Clinical Decision Making Moderate   Rehab Potential Good   PT Frequency 2x / week   PT Duration 8 weeks   PT Treatment/Interventions ADLs/Self Care Home Management;Cryotherapy;Electrical Stimulation;Stair training;Gait training;Patient/family education;Therapeutic exercise;Therapeutic activities;Manual techniques;Passive range of motion;Dry needling;Taping;Splinting   PT Next Visit Plan review HEP; assess motion of the elbow; manual therapy to improve elbow flexion, extension and supination, consider ultraspund    PT Home Exercise Plan wrist flexion stretch; elbow flexion stretch with towel, towel  squeeze, elbow supination stretch   Consulted and Agree with Plan of Care Patient      Patient will benefit from skilled therapeutic intervention in order to improve the following deficits and impairments:  Decreased strength, Pain, Decreased range of motion, Impaired flexibility, Decreased activity tolerance  Visit Diagnosis: Pain in left elbow  Stiffness of left elbow, not elsewhere classified  Muscle weakness (generalized)     Problem List Patient Active Problem List   Diagnosis Date Noted  . RUQ  pain 05/06/2017  . Decreased range of motion of left elbow 04/15/2017  . Hypertriglyceridemia 03/17/2017  . Elbow pain, left 03/17/2017  . Right rib fracture   . Syncope 07/19/2016  . Thrombocytopenia ([place]) 06/29/2016  . Hoarseness, chronic 06/29/2016  . Preventative health care 06/29/2016  . Midline low back pain without sciatica 12/09/2015  . Cirrhosis with alcoholism ([place]) 08/08/2015  . Pancytopenia ([place]) 06/10/2015  . Dermatitis 06/10/2015  . OSA (obstructive sleep apnea) 02/13/2015  . Gout 07/30/2014  . Diarrhea 09/04/2013  . Chronic renal insufficiency, stage III (moderate) 07/18/2013  . Right heart failure due to pulmonary hypertension ([place]) 06/06/2013  . Hypothyroidism 01/25/2013  . PAH (pulmonary arterial hypertension) with portal hypertension ([place]) 01/21/2013  . Allergic rhinitis 12/21/2012  . Secondary pulmonary hypertension 12/09/2012  . Hepatic cirrhosis ([place]) 11/26/2012  . COPD bronchitis 11/01/2012  . GERD (gastroesophageal reflux disease) 09/05/2011  . Hypokalemia 08/29/2011  . Colon polyps 03/26/2011  . Insomnia 03/26/2011  . Hypertension 03/22/2011  . Alcohol dependence ([place]) 03/22/2011    Carney Living  PT DPT  06/11/2017, 9:04 PM  [facility] [address] [place], [place], [address] Phone: [phone]   Fax:  [phone]  Name: Darren Mueller MRN: [id number] Date of Birth: 1951-10-01

## 2017-06-13 LAB — CUP PACEART REMOTE DEVICE CHECK
Date Time Interrogation Session: 20180729163710
MDC IDC PG IMPLANT DT: 20180301

## 2017-06-15 ENCOUNTER — Telehealth (HOSPITAL_COMMUNITY): Payer: Self-pay | Admitting: Vascular Surgery

## 2017-06-15 NOTE — Telephone Encounter (Signed)
L;eft pt message to make f/u appt in OCT

## 2017-06-18 ENCOUNTER — Ambulatory Visit: Payer: Medicare Other | Admitting: Physical Therapy

## 2017-06-18 DIAGNOSIS — M25622 Stiffness of left elbow, not elsewhere classified: Secondary | ICD-10-CM

## 2017-06-18 DIAGNOSIS — M25522 Pain in left elbow: Secondary | ICD-10-CM

## 2017-06-18 DIAGNOSIS — M6281 Muscle weakness (generalized): Secondary | ICD-10-CM

## 2017-06-19 ENCOUNTER — Encounter: Payer: Self-pay | Admitting: Physical Therapy

## 2017-06-19 NOTE — Therapy (Signed)
[place], [place], [address] Phone: [phone]   Fax:  [phone]  Physical Therapy Treatment  Patient Details  Name: Darren Mueller MRN: [id number] Date of Birth: 1951/10/04 Referring Provider: Dr Hulan Saas   Encounter Date: 06/18/2017      PT End of Session - 06/19/17 1249    Visit Number 6   Number of Visits 16   Date for PT Re-Evaluation 08/04/17   PT Start Time 1500   PT Stop Time 1541   PT Time Calculation (min) 41 min   Activity Tolerance Patient tolerated treatment well   Behavior During Therapy [facility] for tasks assessed/performed      Past Medical History:  Diagnosis Date  . Alcohol dependence ([place]) 03/22/2011   In remission   . Alcohol dependence in remission ([place]) 03/22/2011   In remission   . Alcoholic cirrhosis of liver with ascites ([place]) 2014  . Dermatitis 06/10/2015  . Diarrhea 09/04/2013  . Elbow pain, left 03/17/2017  . GERD (gastroesophageal reflux disease)   . Gout   . Heart murmur   . Hx of colonic polyps   . Hypertension   . Hypertriglyceridemia 03/17/2017  . Hypopotassemia   . Otalgia 11/28/2013  . Other chronic pulmonary heart diseases   . Preventative health care 06/29/2016  . Red eye 03/03/2016   right  . Renal insufficiency 07/18/2013  . Seizures ([place])   . Sleep apnea    does not wear CPAP  . Thrombocytopenia ([place]) 06/29/2016  . Unspecified hypothyroidism 01/25/2013  . Unspecified pleural effusion     Past Surgical History:  Procedure Laterality Date  . CARDIAC CATHETERIZATION N/A 12/21/2015   Procedure: Right Heart Cath;  Surgeon: Jolaine Artist, MD;  Location: [facility];  Service: Cardiovascular;  Laterality: N/A;  . CARDIAC CATHETERIZATION N/A 04/15/2016   Procedure: Right/Left Heart Cath and Coronary Angiography;  Surgeon: Jolaine Artist, MD;  Location: [facility];  Service: Cardiovascular;  Laterality: N/A;  . COLONOSCOPY N/A 12/08/2013    Procedure: COLONOSCOPY;  Surgeon: Milus Banister, MD;  [facility]: [facility];  Service: Endoscopy;  Laterality: N/A;  . ESOPHAGOGASTRODUODENOSCOPY (EGD) WITH PROPOFOL N/A 03/27/2016   Procedure: ESOPHAGOGASTRODUODENOSCOPY (EGD) WITH PROPOFOL;  Surgeon: Milus Banister, MD;  Location: [facility];  Service: Endoscopy;  Laterality: N/A;  . LOOP RECORDER INSERTION N/A 01/01/2017   Procedure: Loop Recorder Insertion;  Surgeon: Thompson Grayer, MD;  Location: [facility];  Service: Cardiovascular;  Laterality: N/A;  . RIGHT HEART CATHETERIZATION Right 06/06/2014   Procedure: RIGHT HEART CATH;  Surgeon: Jolaine Artist, MD;  Location: [facility] C[facility];  Service: Cardiovascular;  Laterality: Right;  . UVULOPALATOPHARYNGOPLASTY  1999    There were no vitals filed for this visit.      Subjective Assessment - 06/19/17 0846    Subjective Patient reports that his bicpes was feeling much better until today. His father was going down to the ground. He had to control him to the ground and he felt a pull. He has had increased pain and stiffness since that point. He has been doing his stretches and exercises.    Limitations Standing;Walking   Patient Stated Goals Stop hurting, reach my head to fix my hair   Currently in Pain? Yes   Pain Score 2    Pain Location Elbow   Pain Orientation Left   Pain Descriptors / Indicators Aching;Nagging   Pain Type Acute pain   Pain Radiating  Towards into the hand    Pain Onset More than a month ago   Pain Frequency Constant   Aggravating Factors  using the elcbow    Pain Relieving Factors rest    Effect of Pain on Daily Activities difficulty using the elbow                          [facility] Adult PT Treatment/Exercise - 06/19/17 0001      Elbow Exercises   Other elbow exercises wrist flexion stretch 3x20 sec hold; Bicpes flexion stretch with with towel 2x20 second hold; Towel grip 2x10; self supination stretch 3x15 second holds.    Other elbow  exercises supination 2lb weight; elbow flexion 2lb 2x10; wrist flexion 3lb 2x10      Iontophoresis   Type of Iontophoresis Dexamethasone   Location medial biceps    Dose 1cc    Time 4-6 hours      Manual Therapy   Manual therapy comments Ulnar and radial glides to improve flexion and extension; supination streting with radial glides; Soft tissue mobilization to the medial epicondyle and biceps; mulligan flexion mobilization; IASTYM to medial  bicpes.                 PT Education - 06/19/17 0849    Education provided Yes   Education Details continue stretching at home. Reviewed selft soft tissue mobilization    Person(s) Educated Patient   Methods Explanation;Demonstration   Comprehension Verbalized understanding;Verbal cues required;Returned demonstration          PT Short Term Goals - 06/11/17 2100      PT SHORT TERM GOAL #1   Title Patient will increase his passive left elbow flexion by 15 degrees without pain    Time 4   Period Weeks   Status On-going     PT SHORT TERM GOAL #2   Title Patient will increase his passive left elbow extnesion to full with full supination    Time 4   Period Weeks   Status On-going     PT SHORT TERM GOAL #3   Title Patient will increase his left grip strength by 15 lbs    Time 4   Period Weeks   Status On-going     PT SHORT TERM GOAL #4   Title Patient will report decreased tenderness to palaption in his bicpes and medial elbow    Time 4   Period Weeks   Status On-going           PT Long Term Goals - 05/22/17 1028      PT LONG TERM GOAL #1   Title Patient will be independent with exercise program for dominant arm.    Time 4   Period Weeks   Status New     PT LONG TERM GOAL #2   Title Patient will increase gross left elbow strength to 5/5 in order to perform ADL's without pain   Time 4   Period Weeks   Status New     PT LONG TERM GOAL #3   Title Patient will demsotrate a 26% limitation in FOTO.                 Plan - 06/19/17 1250    Clinical Impression Statement Depsite setbak the patient tolerated treatment well. He did have increased stiffness and soreness with manual therapy and exercises. He was encouraged to stretch and perfrom light soft tissue work at home.  Clinical Presentation Evolving   Clinical Decision Making Moderate   PT Frequency 2x / week   PT Duration 8 weeks   PT Treatment/Interventions ADLs/Self Care Home Management;Cryotherapy;Electrical Stimulation;Stair training;Gait training;Patient/family education;Therapeutic exercise;Therapeutic activities;Manual techniques;Passive range of motion;Dry needling;Taping;Splinting   PT Next Visit Plan review HEP; assess motion of the elbow; manual therapy to improve elbow flexion, extension and supination, consider ultraspund    PT Home Exercise Plan wrist flexion stretch; elbow flexion stretch with towel, towel squeeze, elbow supination stretch   Consulted and Agree with Plan of Care Patient      Patient will benefit from skilled therapeutic intervention in order to improve the following deficits and impairments:     Visit Diagnosis: Pain in left elbow  Stiffness of left elbow, not elsewhere classified  Muscle weakness (generalized)     Problem List Patient Active Problem List   Diagnosis Date Noted  . RUQ pain 05/06/2017  . Decreased range of motion of left elbow 04/15/2017  . Hypertriglyceridemia 03/17/2017  . Elbow pain, left 03/17/2017  . Right rib fracture   . Syncope 07/19/2016  . Thrombocytopenia ([place]) 06/29/2016  . Hoarseness, chronic 06/29/2016  . Preventative health care 06/29/2016  . Midline low back pain without sciatica 12/09/2015  . Cirrhosis with alcoholism ([place]) 08/08/2015  . Pancytopenia ([place]) 06/10/2015  . Dermatitis 06/10/2015  . OSA (obstructive sleep apnea) 02/13/2015  . Gout 07/30/2014  . Diarrhea 09/04/2013  . Chronic renal insufficiency, stage III (moderate) 07/18/2013  .  Right heart failure due to pulmonary hypertension ([place]) 06/06/2013  . Hypothyroidism 01/25/2013  . PAH (pulmonary arterial hypertension) with portal hypertension ([place]) 01/21/2013  . Allergic rhinitis 12/21/2012  . Secondary pulmonary hypertension 12/09/2012  . Hepatic cirrhosis ([place]) 11/26/2012  . COPD bronchitis 11/01/2012  . GERD (gastroesophageal reflux disease) 09/05/2011  . Hypokalemia 08/29/2011  . Colon polyps 03/26/2011  . Insomnia 03/26/2011  . Hypertension 03/22/2011  . Alcohol dependence ([place]) 03/22/2011    Carney Living PT DPT  06/19/2017, 1:05 PM  [facility] [address] [place], [place], [address] Phone: [phone]   Fax:  [phone]  Name: Dajour Pierpoint MRN: [id number] Date of Birth: 08-Dec-1950

## 2017-06-22 ENCOUNTER — Ambulatory Visit (INDEPENDENT_AMBULATORY_CARE_PROVIDER_SITE_OTHER): Payer: Medicare Other | Admitting: Family Medicine

## 2017-06-22 ENCOUNTER — Encounter: Payer: Self-pay | Admitting: Family Medicine

## 2017-06-22 VITALS — BP 120/74 | HR 81 | Temp 97.9°F | Ht 72.0 in | Wt 203.4 lb

## 2017-06-22 DIAGNOSIS — I10 Essential (primary) hypertension: Secondary | ICD-10-CM

## 2017-06-22 DIAGNOSIS — I509 Heart failure, unspecified: Secondary | ICD-10-CM | POA: Diagnosis not present

## 2017-06-22 DIAGNOSIS — D61818 Other pancytopenia: Secondary | ICD-10-CM | POA: Diagnosis not present

## 2017-06-22 DIAGNOSIS — K7030 Alcoholic cirrhosis of liver without ascites: Secondary | ICD-10-CM

## 2017-06-22 DIAGNOSIS — N183 Chronic kidney disease, stage 3 unspecified: Secondary | ICD-10-CM

## 2017-06-22 DIAGNOSIS — E038 Other specified hypothyroidism: Secondary | ICD-10-CM

## 2017-06-22 DIAGNOSIS — R197 Diarrhea, unspecified: Secondary | ICD-10-CM | POA: Diagnosis not present

## 2017-06-22 DIAGNOSIS — E782 Mixed hyperlipidemia: Secondary | ICD-10-CM | POA: Diagnosis not present

## 2017-06-22 DIAGNOSIS — N2889 Other specified disorders of kidney and ureter: Secondary | ICD-10-CM

## 2017-06-22 LAB — COMPREHENSIVE METABOLIC PANEL
ALBUMIN: 3.8 g/dL (ref 3.5–5.2)
ALK PHOS: 147 U/L — AB (ref 39–117)
ALT: 34 U/L (ref 0–53)
AST: 64 U/L — ABNORMAL HIGH (ref 0–37)
BILIRUBIN TOTAL: 0.8 mg/dL (ref 0.2–1.2)
BUN: 33 mg/dL — AB (ref 6–23)
CO2: 21 mEq/L (ref 19–32)
Calcium: 9.1 mg/dL (ref 8.4–10.5)
Chloride: 106 mEq/L (ref 96–112)
Creatinine, Ser: 1.78 mg/dL — ABNORMAL HIGH (ref 0.40–1.50)
GFR: 49.22 mL/min — ABNORMAL LOW (ref >60.00)
Glucose, Bld: 104 mg/dL — ABNORMAL HIGH (ref 70–99)
Potassium: 4.4 mEq/L (ref 3.5–5.1)
SODIUM: 134 meq/L — AB (ref 135–145)
TOTAL PROTEIN: 7.7 g/dL (ref 6.0–8.3)

## 2017-06-22 LAB — LIPID PANEL
Cholesterol: 169 mg/dL (ref 0–200)
HDL: 64.5 mg/dL (ref >39.00)
NONHDL: 104.76
TRIGLYCERIDES: 282 mg/dL — AB (ref 0.0–149.0)
Total CHOL/HDL Ratio: 3
VLDL: 56.4 mg/dL — ABNORMAL HIGH (ref 0.0–40.0)

## 2017-06-22 LAB — LDL CHOLESTEROL, DIRECT: Direct LDL: 70 mg/dL

## 2017-06-22 MED ORDER — ROSUVASTATIN CALCIUM 5 MG PO TABS: 5.0000 mg | ORAL_TABLET | Freq: Every day | ORAL | 5 refills | Status: DC

## 2017-06-22 NOTE — Progress Notes (Signed)
Subjective:  I acted as a Education administrator for Dr. Charlett Blake. [place], [place]  Patient ID: Darren Mueller, male    DOB: 12/05/1950, 66 y.o.   MRN: [id number]  Chief Complaint  Patient presents with  . Follow-up    Patient is here today for a routine F/U. He does state that he ran out of Crestor on Friday and will be needing a refill.    HPI  Patient is in today for a follow up. He is following up on his HTN, and other medical concerns. He was hospitalized with chest pain in July. He is feeling well today and has not had recurrent chest pain. He continues to follow closely with cardiology. No medication changes or new diagnosis on work up. Denies palp/SOB/HA/congestion/fevers or GU c/o. Taking meds as prescribed  Patient Care Team: Mosie Lukes, MD as PCP - General (Family Medicine) Elsie Stain, MD as Consulting Physician (Pulmonary Disease) Bensimhon, Shaune Pascal, MD as Consulting Physician (Cardiology) Wyatt Portela, MD as Consulting Physician (Oncology) Milus Banister, MD as Consulting Physician (Gastroenterology) Gerlene Burdock and [facility] (Dentistry)   Past Medical History:  Diagnosis Date  . Alcohol dependence ([place]) 03/22/2011   In remission   . Alcohol dependence in remission ([place]) 03/22/2011   In remission   . Alcoholic cirrhosis of liver with ascites ([place]) 2014  . Dermatitis 06/10/2015  . Diarrhea 09/04/2013  . Elbow pain, left 03/17/2017  . GERD (gastroesophageal reflux disease)   . Gout   . Heart murmur   . Hx of colonic polyps   . Hypertension   . Hypertriglyceridemia 03/17/2017  . Hypopotassemia   . Otalgia 11/28/2013  . Other chronic pulmonary heart diseases   . Preventative health care 06/29/2016  . Red eye 03/03/2016   right  . Renal insufficiency 07/18/2013  . Seizures ([place])   . Sleep apnea    does not wear CPAP  . Thrombocytopenia ([place]) 06/29/2016  . Unspecified hypothyroidism 01/25/2013  . Unspecified pleural effusion     Past Surgical History:  Procedure  Laterality Date  . CARDIAC CATHETERIZATION N/A 12/21/2015   Procedure: Right Heart Cath;  Surgeon: Jolaine Artist, MD;  Location: [facility];  Service: Cardiovascular;  Laterality: N/A;  . CARDIAC CATHETERIZATION N/A 04/15/2016   Procedure: Right/Left Heart Cath and Coronary Angiography;  Surgeon: Jolaine Artist, MD;  Location: [facility];  Service: Cardiovascular;  Laterality: N/A;  . COLONOSCOPY N/A 12/08/2013   Procedure: COLONOSCOPY;  Surgeon: Milus Banister, MD;  [facility];  Service: Endoscopy;  Laterality: N/A;  . ESOPHAGOGASTRODUODENOSCOPY (EGD) WITH PROPOFOL N/A 03/27/2016   Procedure: ESOPHAGOGASTRODUODENOSCOPY (EGD) WITH PROPOFOL;  Surgeon: Milus Banister, MD;  Location: [facility];  Service: Endoscopy;  Laterality: N/A;  . LOOP RECORDER INSERTION N/A 01/01/2017   Procedure: Loop Recorder Insertion;  Surgeon: Thompson Grayer, MD;  Location: [facility];  Service: Cardiovascular;  Laterality: N/A;  . RIGHT HEART CATHETERIZATION Right 06/06/2014   Procedure: RIGHT HEART CATH;  Surgeon: Jolaine Artist, MD;  Location: [facility] CATH LAB;  Service: Cardiovascular;  Laterality: Right;  . UVULOPALATOPHARYNGOPLASTY  1999    Family History  Problem Relation Age of Onset  . Heart disease Mother   . Heart attack Mother   . Hypertension Mother   . Prostate cancer Father   . Colon cancer Neg Hx     Social History   Social History  . Marital status: Divorced    Spouse name: N/A  .  Number of children: 3  . Years of education: N/A   Occupational History  . Retired     Social research officer, government   Social History Main Topics  . Smoking status: Former Smoker    Packs/day: 2.00    Years: 5.00    Types: Cigarettes    Quit date: 09/22/1979  . Smokeless tobacco: Never Used  . Alcohol use 3.6 oz/week    6 Shots of liquor per week     Comment: 12/31/2016 " "2 vodka & tonics on MWF"  . Drug use: No  . Sexual activity: Not Currently    Birth control/ protection:  Spermicide   Other Topics Concern  . Not on file   Social History Narrative   0 caffeine drinks daily     Outpatient Medications Prior to Visit  Medication Sig Dispense Refill  . Albuterol Sulfate (PROAIR RESPICLICK) 485 (90 Base) MCG/ACT AEPB Inhale 2 puffs into the lungs every 6 (six) hours as needed (SOB or wheezing).    Marland Kitchen allopurinol (ZYLOPRIM) 100 MG tablet Take 1 tablet (100 mg total) by mouth daily. 90 tablet 1  . doxepin (SINEQUAN) 10 MG capsule Take 1-2 capsules (10-20 mg total) by mouth at bedtime as needed. 60 capsule 5  . Fluticasone Propionate 0.05 % LOTN Apply daily to affected area 3 Bottle 3  . fluticasone-salmeterol (ADVAIR HFA) 230-21 MCG/ACT inhaler Inhale 2 puffs into the lungs every morning.    . furosemide (LASIX) 40 MG tablet Take 40 mg (1 tab) once on Monday and Friday mornings. Only take if weight is 200 pounds or greater. 30 tablet 3  . levothyroxine (SYNTHROID, LEVOTHROID) 25 MCG tablet TAKE 1 TABLET DAILY 90 tablet 1  . loperamide (ANTI-DIARRHEAL) 2 MG capsule Take 4 mg by mouth as needed for diarrhea or loose stools (up to 8 capsules per day).     Marland Kitchen losartan (COZAAR) 50 MG tablet TAKE 1 TABLET DAILY 90 tablet 2  . Macitentan (OPSUMIT) 10 MG TABS Take 10 mg by mouth daily. 90 tablet 3  . Multiple Vitamin (MULTIVITAMIN WITH MINERALS) TABS tablet Take 1 tablet by mouth daily.    Marland Kitchen omeprazole (PRILOSEC) 40 MG capsule TAKE 1 CAPSULE DAILY 90 capsule 2  . potassium chloride (K-DUR,KLOR-CON) 10 MEQ tablet 2 tablets daily, extra 2 tablets on Monday and Friday only if you take lasix. 20 tablet 6  . Selexipag (UPTRAVI) 1000 MCG TABS Take 1,000 mcg by mouth 2 (two) times daily. 180 tablet 3  . spironolactone (ALDACTONE) 50 MG tablet TAKE 1 TABLET DAILY 90 tablet 1  . SYMAX-SR 0.375 MG 12 hr tablet TAKE 1 TABLET TWICE A DAY 180 tablet 1  . tadalafil, PAH, (ADCIRCA) 20 MG tablet TAKE 2 TABLETS (40 MG TOTAL) DAILY 180 tablet 2  . rosuvastatin (CRESTOR) 5 MG tablet Take 1  tablet (5 mg total) by mouth daily. 30 tablet 3   No facility-administered medications prior to visit.     Allergies  Allergen Reactions  . Aspirin Other (See Comments)    hypertension  . Nitroglycerin     Headache    Review of Systems  Constitutional: Positive for malaise/fatigue. Negative for fever.  HENT: Negative for congestion.   Eyes: Negative for blurred vision.  Respiratory: Negative for cough and shortness of breath.   Cardiovascular: Negative for chest pain, palpitations and leg swelling.  Gastrointestinal: Negative for vomiting.  Musculoskeletal: Negative for back pain.  Skin: Negative for rash.  Neurological: Negative for loss of consciousness and headaches.  Objective:    Physical Exam  Constitutional: He is oriented to person, place, and time. He appears well-developed and well-nourished. No distress.  HENT:  Head: Normocephalic and atraumatic.  Eyes: Conjunctivae are normal.  Neck: Normal range of motion. No thyromegaly present.  Cardiovascular: Normal rate and regular rhythm.   Pulmonary/Chest: Effort normal and breath sounds normal. He has no wheezes.  Abdominal: Soft. Bowel sounds are normal. There is no tenderness.  Musculoskeletal: Normal range of motion. He exhibits no edema or deformity.  Neurological: He is alert and oriented to person, place, and time.  Skin: Skin is warm and dry. He is not diaphoretic.  Psychiatric: He has a normal mood and affect.    BP 120/74 (BP Location: Left Arm, Patient Position: Sitting, Cuff Size: Normal)   Pulse 81   Temp 97.9 F (36.6 C) (Oral)   Ht 6' (1.829 m)   Wt 203 lb 6.4 oz (92.3 kg)   SpO2 97%   BMI 27.59 kg/m  Wt Readings from Last 3 Encounters:  06/22/17 203 lb 6.4 oz (92.3 kg)  06/10/17 200 lb (90.7 kg)  05/19/17 202 lb 6.4 oz (91.8 kg)   BP Readings from Last 3 Encounters:  06/22/17 120/74  06/10/17 130/72  05/19/17 120/68     Immunization History  Administered Date(s) Administered    . Hepatitis B 12/03/2012, 01/11/2013  . Hepatitis B, ped/adol 06/03/2013  . Influenza Split 08/28/2011, 11/03/2012  . Influenza, High Dose Seasonal PF 09/11/2016  . Influenza,inj,Quad PF,6+ Mos 07/18/2013, 07/25/2014, 09/03/2015  . Pneumococcal Conjugate-13 09/03/2015  . Pneumococcal Polysaccharide-23 08/30/2013  . Tdap 09/02/2013    Health Maintenance  Topic Date Due  . INFLUENZA VACCINE  06/03/2017  . PNA vac Low Risk Adult (2 of 2 - PPSV23) 08/30/2018  . TETANUS/TDAP  09/03/2023  . COLONOSCOPY  12/09/2023  . Hepatitis C Screening  Completed    Lab Results  Component Value Date   WBC 2.1 Repeated and verified X2. (L) 05/11/2017   HGB 12.5 (L) 05/11/2017   HCT 36.1 (L) 05/11/2017   PLT 40.0 Repeated and verified X2. (LL) 05/11/2017   GLUCOSE 104 (H) 06/22/2017   CHOL 169 06/22/2017   TRIG 282.0 (H) 06/22/2017   HDL 64.50 06/22/2017   LDLDIRECT 70.0 06/22/2017   LDLCALC 77 12/04/2015   ALT 34 06/22/2017   AST 64 (H) 06/22/2017   NA 134 (L) 06/22/2017   K 4.4 06/22/2017   CL 106 06/22/2017   CREATININE 1.78 (H) 06/22/2017   BUN 33 (H) 06/22/2017   CO2 21 06/22/2017   TSH 2.84 03/17/2017   PSA 0.49 11/01/2012   INR 1.25 06/04/2016   HGBA1C 5.4 03/02/2015    Lab Results  Component Value Date   TSH 2.84 03/17/2017   Lab Results  Component Value Date   WBC 2.1 Repeated and verified X2. (L) 05/11/2017   HGB 12.5 (L) 05/11/2017   HCT 36.1 (L) 05/11/2017   MCV 106.5 (H) 05/11/2017   PLT 40.0 Repeated and verified X2. (LL) 05/11/2017   Lab Results  Component Value Date   NA 134 (L) 06/22/2017   K 4.4 06/22/2017   CO2 21 06/22/2017   GLUCOSE 104 (H) 06/22/2017   BUN 33 (H) 06/22/2017   CREATININE 1.78 (H) 06/22/2017   BILITOT 0.8 06/22/2017   ALKPHOS 147 (H) 06/22/2017   AST 64 (H) 06/22/2017   ALT 34 06/22/2017   PROT 7.7 06/22/2017   ALBUMIN 3.8 06/22/2017   CALCIUM 9.1 06/22/2017   ANIONGAP  7 05/19/2017   GFR 49.22 (L) 06/22/2017   Lab Results   Component Value Date   CHOL 169 06/22/2017   Lab Results  Component Value Date   HDL 64.50 06/22/2017   Lab Results  Component Value Date   LDLCALC 77 12/04/2015   Lab Results  Component Value Date   TRIG 282.0 (H) 06/22/2017   Lab Results  Component Value Date   CHOLHDL 3 06/22/2017   Lab Results  Component Value Date   HGBA1C 5.4 03/02/2015         Assessment & Plan:   Problem List Items Addressed This Visit    Hypertension (Chronic)    Well controlled, no changes to meds. Encouraged heart healthy diet such as the DASH diet and exercise as tolerated.       Relevant Medications   rosuvastatin (CRESTOR) 5 MG tablet   Chronic renal insufficiency, stage III (moderate) (Chronic)    Stable. Will continue to monitor.      Cirrhosis with alcoholism ([place]) (Chronic)    Once again reminded alcohol cessation should be his goal      Hypothyroidism    On Levothyroxine, continue to monitor      Diarrhea    Secondary to medication side effect, is managing well at present      Pancytopenia ([place])    Secondary to liver disease and stable       Other Visit Diagnoses    Mixed hyperlipidemia    -  Primary   Relevant Medications   rosuvastatin (CRESTOR) 5 MG tablet   Other Relevant Orders   Lipid panel (Completed)   Congestive heart failure, unspecified HF chronicity, unspecified heart failure type ([place])       Relevant Medications   rosuvastatin (CRESTOR) 5 MG tablet   Other Relevant Orders   Comprehensive metabolic panel (Completed)      I am having Mr. Ostergaard maintain his loperamide, Macitentan, Fluticasone Propionate, levothyroxine, omeprazole, SYMAX-SR, Albuterol Sulfate, fluticasone-salmeterol, spironolactone, Selexipag, multivitamin with minerals, doxepin, tadalafil (PAH), allopurinol, furosemide, potassium chloride, losartan, and rosuvastatin.  Meds ordered this encounter  Medications  . rosuvastatin (CRESTOR) 5 MG tablet    Sig: Take 1 tablet (5 mg  total) by mouth daily.    Dispense:  30 tablet    Refill:  5    CMA served as scribe during this visit. History, Physical and Plan performed by medical provider. Documentation and orders reviewed and attested to.  Penni Homans, MD

## 2017-06-22 NOTE — Patient Instructions (Signed)

## 2017-06-23 ENCOUNTER — Ambulatory Visit: Payer: Medicare Other | Admitting: Physical Therapy

## 2017-06-23 ENCOUNTER — Encounter: Payer: Self-pay | Admitting: Physical Therapy

## 2017-06-23 DIAGNOSIS — M25522 Pain in left elbow: Secondary | ICD-10-CM | POA: Diagnosis not present

## 2017-06-23 DIAGNOSIS — M6281 Muscle weakness (generalized): Secondary | ICD-10-CM

## 2017-06-23 DIAGNOSIS — M25622 Stiffness of left elbow, not elsewhere classified: Secondary | ICD-10-CM

## 2017-06-23 NOTE — Therapy (Signed)
[place], [place], [address] Phone: [phone]   Fax:  [phone]  Physical Therapy Treatment  Patient Details  Name: Darren Mueller MRN: [id number] Date of Birth: April 18, 1951 Referring Provider: Dr Hulan Saas   Encounter Date: 06/23/2017      PT End of Session - 06/23/17 1612    Visit Number 7   Number of Visits 16   Date for PT Re-Evaluation 08/04/17   PT Start Time 1510   PT Stop Time 1548   PT Time Calculation (min) 38 min   Activity Tolerance Patient tolerated treatment well   Behavior During Therapy [facility] for tasks assessed/performed      Past Medical History:  Diagnosis Date  . Alcohol dependence ([place]) 03/22/2011   In remission   . Alcohol dependence in remission ([place]) 03/22/2011   In remission   . Alcoholic cirrhosis of liver with ascites ([place]) 2014  . Dermatitis 06/10/2015  . Diarrhea 09/04/2013  . Elbow pain, left 03/17/2017  . GERD (gastroesophageal reflux disease)   . Gout   . Heart murmur   . Hx of colonic polyps   . Hypertension   . Hypertriglyceridemia 03/17/2017  . Hypopotassemia   . Otalgia 11/28/2013  . Other chronic pulmonary heart diseases   . Preventative health care 06/29/2016  . Red eye 03/03/2016   right  . Renal insufficiency 07/18/2013  . Seizures ([place])   . Sleep apnea    does not wear CPAP  . Thrombocytopenia ([place]) 06/29/2016  . Unspecified hypothyroidism 01/25/2013  . Unspecified pleural effusion     Past Surgical History:  Procedure Laterality Date  . CARDIAC CATHETERIZATION N/A 12/21/2015   Procedure: Right Heart Cath;  Surgeon: Jolaine Artist, MD;  Location: [place] [facility];  Service: Cardiovascular;  Laterality: N/A;  . CARDIAC CATHETERIZATION N/A 04/15/2016   Procedure: Right/Left Heart Cath and Coronary Angiography;  Surgeon: Jolaine Artist, MD;  Location: [place] [facility];  Service: Cardiovascular;  Laterality: N/A;  . COLONOSCOPY N/A 12/08/2013   Procedure: COLONOSCOPY;  Surgeon: Milus Banister, MD;  Location: [facility];  Service: Endoscopy;  Laterality: N/A;  . ESOPHAGOGASTRODUODENOSCOPY (EGD) WITH PROPOFOL N/A 03/27/2016   Procedure: ESOPHAGOGASTRODUODENOSCOPY (EGD) WITH PROPOFOL;  Surgeon: Milus Banister, MD;  Location: [place];  Service: Endoscopy;  Laterality: N/A;  . LOOP RECORDER INSERTION N/A 01/01/2017   Procedure: Loop Recorder Insertion;  Surgeon: Thompson Grayer, MD;  Location: [place] [facility];  Service: Cardiovascular;  Laterality: N/A;  . RIGHT HEART CATHETERIZATION Right 06/06/2014   Procedure: RIGHT HEART CATH;  Surgeon: Jolaine Artist, MD;  Location: [facility] CATH [facility];  Service: Cardiovascular;  Laterality: Right;  . UVULOPALATOPHARYNGOPLASTY  1999    There were no vitals filed for this visit.      Subjective Assessment - 06/23/17 1608    Subjective Patient'smother has been in the hospital. He has not had much time to do his exercises. He reports it is bettetr then it was the other day. He continues to have tenderness to palpation but not much pain without movement.    Limitations Standing;Walking   Patient Stated Goals Stop hurting, reach my head to fix my hair   Currently in Pain? Yes   Pain Score 2    Pain Location Elbow   Pain Orientation Left   Pain Descriptors / Indicators Aching   Pain Type Acute pain   Pain Onset More than a month ago   Pain Frequency Constant  Aggravating Factors  using the elbow    Pain Relieving Factors rest    Effect of Pain on Daily Activities difficulty using the elbow                          [facility] Adult PT Treatment/Exercise - 06/23/17 0001      Iontophoresis   Type of Iontophoresis Dexamethasone   Location medial biceps    Dose 1cc    Time 4-6 hours      Manual Therapy   Manual therapy comments Ulnar and radial glides to improve flexion and extension; supination streting with radial glides; Soft tissue mobilization to the medial epicondyle and  biceps; mulligan flexion mobilization; IASTYM to medial  bicpes.                 PT Education - 06/23/17 1610    Education provided Yes   Education Details reviewed stretchs and self soft tissue work.    Person(s) Educated Patient   Methods Explanation;Demonstration   Comprehension Verbalized understanding;Returned demonstration;Verbal cues required          PT Short Term Goals - 06/23/17 1639      PT SHORT TERM GOAL #1   Title Patient will increase his passive left elbow flexion by 15 degrees without pain    Time 4   Period Weeks   Status On-going     PT SHORT TERM GOAL #2   Title Patient will increase his passive left elbow extnesion to full with full supination    Time 4   Period Weeks   Status On-going     PT SHORT TERM GOAL #3   Title Patient will increase his left grip strength by 15 lbs    Time 4   Period Weeks   Status On-going     PT SHORT TERM GOAL #4   Title Patient will report decreased tenderness to palaption in his bicpes and medial elbow    Time 4   Period Weeks   Status On-going           PT Long Term Goals - 05/22/17 1028      PT LONG TERM GOAL #1   Title Patient will be independent with exercise program for dominant arm.    Time 4   Period Weeks   Status New     PT LONG TERM GOAL #2   Title Patient will increase gross left elbow strength to 5/5 in order to perform ADL's without pain   Time 4   Period Weeks   Status New     PT LONG TERM GOAL #3   Title Patient will demsotrate a 26% limitation in FOTO.                Plan - 06/23/17 1619    Clinical Impression Statement Patient was 10 minutes late for appointment. Therapy just focused on manual work 2nd to shortened time. Hi ran   Clinical Presentation Evolving   Clinical Decision Making Moderate   Rehab Potential Good   PT Frequency 2x / week   PT Duration 8 weeks   PT Treatment/Interventions ADLs/Self Care Home Management;Cryotherapy;Electrical Stimulation;Stair  training;Gait training;Patient/family education;Therapeutic exercise;Therapeutic activities;Manual techniques;Passive range of motion;Dry needling;Taping;Splinting   PT Next Visit Plan review HEP; assess motion of the elbow; manual therapy to improve elbow flexion, extension and supination, consider ultraspund    PT Home Exercise Plan wrist flexion stretch; elbow flexion stretch with towel, towel squeeze, elbow  supination stretch   Consulted and Agree with Plan of Care Patient      Patient will benefit from skilled therapeutic intervention in order to improve the following deficits and impairments:  Decreased strength, Pain, Decreased range of motion, Impaired flexibility, Decreased activity tolerance  Visit Diagnosis: Pain in left elbow  Stiffness of left elbow, not elsewhere classified  Muscle weakness (generalized)     Problem List Patient Active Problem List   Diagnosis Date Noted  . RUQ pain 05/06/2017  . Decreased range of motion of left elbow 04/15/2017  . Hypertriglyceridemia 03/17/2017  . Elbow pain, left 03/17/2017  . Right rib fracture   . Syncope 07/19/2016  . Thrombocytopenia ([place]) 06/29/2016  . Hoarseness, chronic 06/29/2016  . Preventative health care 06/29/2016  . Midline low back pain without sciatica 12/09/2015  . Cirrhosis with alcoholism ([place]) 08/08/2015  . Pancytopenia ([place]) 06/10/2015  . Dermatitis 06/10/2015  . OSA (obstructive sleep apnea) 02/13/2015  . Gout 07/30/2014  . Diarrhea 09/04/2013  . Chronic renal insufficiency, stage III (moderate) 07/18/2013  . Right heart failure due to pulmonary hypertension ([place]) 06/06/2013  . Hypothyroidism 01/25/2013  . PAH (pulmonary arterial hypertension) with portal hypertension ([place]) 01/21/2013  . Allergic rhinitis 12/21/2012  . Secondary pulmonary hypertension 12/09/2012  . Hepatic cirrhosis ([place]) 11/26/2012  . COPD bronchitis 11/01/2012  . GERD (gastroesophageal reflux disease) 09/05/2011  . Hypokalemia  08/29/2011  . Colon polyps 03/26/2011  . Insomnia 03/26/2011  . Hypertension 03/22/2011  . Alcohol dependence ([place]) 03/22/2011    Carney Living  PT DPT  06/23/2017, 4:41 PM  [facility] [address] [place], [place], [address] Phone: [phone]   Fax:  [phone]  Name: Darren Mueller MRN: [id number] Date of Birth: 1951/05/07

## 2017-06-25 ENCOUNTER — Ambulatory Visit: Payer: Medicare Other | Admitting: Physical Therapy

## 2017-06-25 DIAGNOSIS — M25522 Pain in left elbow: Secondary | ICD-10-CM | POA: Diagnosis not present

## 2017-06-25 DIAGNOSIS — M25622 Stiffness of left elbow, not elsewhere classified: Secondary | ICD-10-CM

## 2017-06-25 DIAGNOSIS — M6281 Muscle weakness (generalized): Secondary | ICD-10-CM

## 2017-06-26 NOTE — Therapy (Signed)
[place], [place], [address] Phone: [phone]   Fax:  [phone]  Physical Therapy Treatment  Patient Details  Name: Darren Mueller MRN: [id number] Date of Birth: Jan 15, 1951 Referring Provider: Dr Hulan Saas   Encounter Date: 06/25/2017      PT End of Session - 06/26/17 1234    Visit Number 8   Number of Visits 16   Date for PT Re-Evaluation 08/04/17   PT Start Time 0307   PT Stop Time 0345   PT Time Calculation (min) 38 min   Activity Tolerance Patient tolerated treatment well   Behavior During Therapy [facility] for tasks assessed/performed      Past Medical History:  Diagnosis Date  . Alcohol dependence ([place]) 03/22/2011   In remission   . Alcohol dependence in remission ([place]) 03/22/2011   In remission   . Alcoholic cirrhosis of liver with ascites ([place]) 2014  . Dermatitis 06/10/2015  . Diarrhea 09/04/2013  . Elbow pain, left 03/17/2017  . GERD (gastroesophageal reflux disease)   . Gout   . Heart murmur   . Hx of colonic polyps   . Hypertension   . Hypertriglyceridemia 03/17/2017  . Hypopotassemia   . Otalgia 11/28/2013  . Other chronic pulmonary heart diseases   . Preventative health care 06/29/2016  . Red eye 03/03/2016   right  . Renal insufficiency 07/18/2013  . Seizures ([place])   . Sleep apnea    does not wear CPAP  . Thrombocytopenia ([place]) 06/29/2016  . Unspecified hypothyroidism 01/25/2013  . Unspecified pleural effusion     Past Surgical History:  Procedure Laterality Date  . CARDIAC CATHETERIZATION N/A 12/21/2015   Procedure: Right Heart Cath;  Surgeon: Jolaine Artist, MD;  Location: [place] [facility];  Service: Cardiovascular;  Laterality: N/A;  . CARDIAC CATHETERIZATION N/A 04/15/2016   Procedure: Right/Left Heart Cath and Coronary Angiography;  Surgeon: Jolaine Artist, MD;  Location: [place] [facility];  Service: Cardiovascular;  Laterality: N/A;  . COLONOSCOPY N/A 12/08/2013   Procedure: COLONOSCOPY;  Surgeon: Milus Banister, MD;  Location: [facility];  Service: Endoscopy;  Laterality: N/A;  . ESOPHAGOGASTRODUODENOSCOPY (EGD) WITH PROPOFOL N/A 03/27/2016   Procedure: ESOPHAGOGASTRODUODENOSCOPY (EGD) WITH PROPOFOL;  Surgeon: Milus Banister, MD;  Location: [place];  Service: Endoscopy;  Laterality: N/A;  . LOOP RECORDER INSERTION N/A 01/01/2017   Procedure: Loop Recorder Insertion;  Surgeon: Thompson Grayer, MD;  Location: [place] [facility];  Service: Cardiovascular;  Laterality: N/A;  . RIGHT HEART CATHETERIZATION Right 06/06/2014   Procedure: RIGHT HEART CATH;  Surgeon: Jolaine Artist, MD;  Location: [facility] CATH LAB;  Service: Cardiovascular;  Laterality: Right;  . UVULOPALATOPHARYNGOPLASTY  1999    There were no vitals filed for this visit.      Subjective Assessment - 06/26/17 1225    Subjective Patient agreed to trigger point dry needling. He again reports he is not on any blood thinners. He reports he has not had much time to think about his pain. His mother has been in the hospital.    Limitations Standing;Walking   Patient Stated Goals Stop hurting, reach my head to fix my hair   Currently in Pain? Yes   Pain Score 3    Pain Location Elbow   Pain Orientation Left   Pain Type Acute pain   Pain Radiating Towards into the medial forearm but better    Pain Onset More than a month ago   Pain Frequency  Constant   Aggravating Factors  using the elbow    Pain Relieving Factors rest    Effect of Pain on Daily Activities difficulty using the elbow                            Trigger Point Dry Needling - 06/26/17 1227    Consent Given? Yes   Education Handout Provided Yes   Muscles Treated Upper Body --  Bicpes 2x good twitch respose elictied               PT Education - 06/26/17 1226    Education provided Yes   Education Details risks and benefits of TPDN    Person(s) Educated Patient   Methods Explanation;Demonstration    Comprehension Verbalized understanding;Returned demonstration;Verbal cues required          PT Short Term Goals - 06/23/17 1639      PT SHORT TERM GOAL #1   Title Patient will increase his passive left elbow flexion by 15 degrees without pain    Time 4   Period Weeks   Status On-going     PT SHORT TERM GOAL #2   Title Patient will increase his passive left elbow extnesion to full with full supination    Time 4   Period Weeks   Status On-going     PT SHORT TERM GOAL #3   Title Patient will increase his left grip strength by 15 lbs    Time 4   Period Weeks   Status On-going     PT SHORT TERM GOAL #4   Title Patient will report decreased tenderness to palaption in his bicpes and medial elbow    Time 4   Period Weeks   Status On-going           PT Long Term Goals - 06/26/17 1232      PT LONG TERM GOAL #1   Title Patient will be independent with exercise program for dominant arm.    Baseline perfroming exercises but has not progressed to functional activity    Time 4   Period Weeks   Status On-going     PT LONG TERM GOAL #2   Title Patient will increase gross left elbow strength to 5/5 in order to perform ADL's without pain   Time 8   Period Weeks   Status On-going     PT LONG TERM GOAL #3   Title Patient will demsotrate a 26% limitation in FOTO.    Time 8   Period Weeks   Status New               Plan - 06/26/17 1229    Clinical Impression Statement Patient tolerated TPDN well. Therapy needled 3 spots in his medial bicpes. He had 1 very good twitch respose. Therapy also worked on Astronomer. He reported less pain after treatment. Therapy reviewed how to reduce post needle soreness. Patient was 7 minutes late for his appointment.    History and Personal Factors relevant to plan of care: CHF    Clinical Presentation Evolving   Clinical Decision Making Moderate   Rehab Potential Good   PT Frequency 2x / week   PT Duration 8 weeks   PT  Treatment/Interventions ADLs/Self Care Home Management;Cryotherapy;Electrical Stimulation;Stair training;Gait training;Patient/family education;Therapeutic exercise;Therapeutic activities;Manual techniques;Passive range of motion;Dry needling;Taping;Splinting   PT Next Visit Plan review HEP; assess motion of the elbow; manual therapy to improve elbow  flexion, extension and supination, consider ultraspund    PT Home Exercise Plan wrist flexion stretch; elbow flexion stretch with towel, towel squeeze, elbow supination stretch   Consulted and Agree with Plan of Care Patient      Patient will benefit from skilled therapeutic intervention in order to improve the following deficits and impairments:  Decreased strength, Pain, Decreased range of motion, Impaired flexibility, Decreased activity tolerance  Visit Diagnosis: Pain in left elbow  Stiffness of left elbow, not elsewhere classified  Muscle weakness (generalized)     Problem List Patient Active Problem List   Diagnosis Date Noted  . RUQ pain 05/06/2017  . Decreased range of motion of left elbow 04/15/2017  . Hypertriglyceridemia 03/17/2017  . Elbow pain, left 03/17/2017  . Right rib fracture   . Syncope 07/19/2016  . Thrombocytopenia ([place]) 06/29/2016  . Hoarseness, chronic 06/29/2016  . Preventative health care 06/29/2016  . Midline low back pain without sciatica 12/09/2015  . Cirrhosis with alcoholism ([place]) 08/08/2015  . Pancytopenia ([place]) 06/10/2015  . Dermatitis 06/10/2015  . OSA (obstructive sleep apnea) 02/13/2015  . Gout 07/30/2014  . Diarrhea 09/04/2013  . Chronic renal insufficiency, stage III (moderate) 07/18/2013  . Right heart failure due to pulmonary hypertension ([place]) 06/06/2013  . Hypothyroidism 01/25/2013  . PAH (pulmonary arterial hypertension) with portal hypertension ([place]) 01/21/2013  . Allergic rhinitis 12/21/2012  . Secondary pulmonary hypertension 12/09/2012  . Hepatic cirrhosis ([place]) 11/26/2012  .  COPD bronchitis 11/01/2012  . GERD (gastroesophageal reflux disease) 09/05/2011  . Hypokalemia 08/29/2011  . Colon polyps 03/26/2011  . Insomnia 03/26/2011  . Hypertension 03/22/2011  . Alcohol dependence ([place]) 03/22/2011    Carney Living  PT DPT  06/26/2017, 12:35 PM  [facility] [address] [place], [place], [address] Phone: [phone]   Fax:  [phone]  Name: Darren Mueller MRN: [id number] Date of Birth: 1951/05/31

## 2017-06-28 NOTE — Assessment & Plan Note (Signed)
On Levothyroxine, continue to monitor 

## 2017-06-28 NOTE — Assessment & Plan Note (Signed)
Secondary to medication side effect, is managing well at present

## 2017-06-28 NOTE — Assessment & Plan Note (Signed)
Well controlled, no changes to meds. Encouraged heart healthy diet such as the DASH diet and exercise as tolerated.

## 2017-06-28 NOTE — Assessment & Plan Note (Signed)
Stable. Will continue to monitor.

## 2017-06-28 NOTE — Assessment & Plan Note (Signed)
Once again reminded alcohol cessation should be his goal

## 2017-06-28 NOTE — Assessment & Plan Note (Signed)
Secondary to liver disease and stable

## 2017-06-30 ENCOUNTER — Encounter: Payer: Self-pay | Admitting: Physical Therapy

## 2017-06-30 ENCOUNTER — Ambulatory Visit: Payer: Medicare Other | Admitting: Physical Therapy

## 2017-06-30 ENCOUNTER — Ambulatory Visit (INDEPENDENT_AMBULATORY_CARE_PROVIDER_SITE_OTHER): Payer: Medicare Other | Admitting: *Deleted

## 2017-06-30 DIAGNOSIS — M6281 Muscle weakness (generalized): Secondary | ICD-10-CM

## 2017-06-30 DIAGNOSIS — M25522 Pain in left elbow: Secondary | ICD-10-CM | POA: Diagnosis not present

## 2017-06-30 DIAGNOSIS — M25622 Stiffness of left elbow, not elsewhere classified: Secondary | ICD-10-CM | POA: Diagnosis not present

## 2017-06-30 DIAGNOSIS — R55 Syncope and collapse: Secondary | ICD-10-CM

## 2017-07-01 LAB — CUP PACEART REMOTE DEVICE CHECK
Date Time Interrogation Session: 20180828210320
MDC IDC PG IMPLANT DT: 20180301

## 2017-07-01 NOTE — Therapy (Signed)
[place], [place], [address] Phone: [phone]   Fax:  [phone]  Physical Therapy Treatment  Patient Details  Name: Darren Mueller MRN: [id number] Date of Birth: 21-Nov-1950 Referring Provider: Dr Hulan Saas   Encounter Date: 06/30/2017      PT End of Session - 06/30/17 2147    Visit Number 9   Number of Visits 16   Date for PT Re-Evaluation 08/04/17   PT Start Time 1500   PT Stop Time 1544   PT Time Calculation (min) 44 min   Activity Tolerance Patient tolerated treatment well   Behavior During Therapy [facility] for tasks assessed/performed      Past Medical History:  Diagnosis Date  . Alcohol dependence ([place]) 03/22/2011   In remission   . Alcohol dependence in remission ([place]) 03/22/2011   In remission   . Alcoholic cirrhosis of liver with ascites ([place]) 2014  . Dermatitis 06/10/2015  . Diarrhea 09/04/2013  . Elbow pain, left 03/17/2017  . GERD (gastroesophageal reflux disease)   . Gout   . Heart murmur   . Hx of colonic polyps   . Hypertension   . Hypertriglyceridemia 03/17/2017  . Hypopotassemia   . Otalgia 11/28/2013  . Other chronic pulmonary heart diseases   . Preventative health care 06/29/2016  . Red eye 03/03/2016   right  . Renal insufficiency 07/18/2013  . Seizures ([place])   . Sleep apnea    does not wear CPAP  . Thrombocytopenia ([place]) 06/29/2016  . Unspecified hypothyroidism 01/25/2013  . Unspecified pleural effusion     Past Surgical History:  Procedure Laterality Date  . CARDIAC CATHETERIZATION N/A 12/21/2015   Procedure: Right Heart Cath;  Surgeon: Jolaine Artist, MD;  Location: [place] [facility];  Service: Cardiovascular;  Laterality: N/A;  . CARDIAC CATHETERIZATION N/A 04/15/2016   Procedure: Right/Left Heart Cath and Coronary Angiography;  Surgeon: Jolaine Artist, MD;  Location: [place] [facility];  Service: Cardiovascular;  Laterality: N/A;  . COLONOSCOPY N/A 12/08/2013   Procedure: COLONOSCOPY;  Surgeon: Milus Banister, MD;  [facility];  Service: Endoscopy;  Laterality: N/A;  . ESOPHAGOGASTRODUODENOSCOPY (EGD) WITH PROPOFOL N/A 03/27/2016   Procedure: ESOPHAGOGASTRODUODENOSCOPY (EGD) WITH PROPOFOL;  Surgeon: Milus Banister, MD;  Location: [place];  Service: Endoscopy;  Laterality: N/A;  . LOOP RECORDER INSERTION N/A 01/01/2017   Procedure: Loop Recorder Insertion;  Surgeon: Thompson Grayer, MD;  Location: [place] [facility];  Service: Cardiovascular;  Laterality: N/A;  . RIGHT HEART CATHETERIZATION Right 06/06/2014   Procedure: RIGHT HEART CATH;  Surgeon: Jolaine Artist, MD;  [facility];  Service: Cardiovascular;  Laterality: Right;  . UVULOPALATOPHARYNGOPLASTY  1999    There were no vitals filed for this visit.      Subjective Assessment - 06/30/17 2146    Subjective Patient reports he flet good for several days but today he is not feeling any pain.    Limitations Standing;Walking   Patient Stated Goals Stop hurting, reach my head to fix my hair   Currently in Pain? Yes   Pain Score 2    Pain Location Elbow   Pain Orientation Left   Pain Descriptors / Indicators Aching   Pain Type Acute pain   Pain Onset More than a month ago   Pain Frequency Constant   Aggravating Factors  reaching behind his head    Pain Relieving Factors rest   Effect of Pain on Daily Activities difficulty  using the elbow                          [facility] Adult PT Treatment/Exercise - 07/01/17 0001      Elbow Exercises   Other elbow exercises wrist flexion stretch 3x20 sec hold; Bicpes flexion stretch with with towel 2x20 second hold; Towel grip 2x10; self supination stretch 3x15 second holds.    Other elbow exercises supination 4lb weight; elbow flexion 4lb 2x10; wrist flexion 4lb 2x10      Shoulder Exercises: Standing   Other Standing Exercises shoulder extension 2x10 red; scap retraction 2x10; punch 2x10      Manual Therapy    Manual therapy comments Ulnar and radial glides to improve flexion and extension; supination streting with radial glides; Soft tissue mobilization to the medial epicondyle and biceps; mulligan flexion mobilization; IASTYM to medial  bicpes.                 PT Education - 06/30/17 2147    Education provided Yes   Education Details risks and benefits of TPDN    Person(s) Educated Patient   Methods Explanation;Demonstration   Comprehension Verbalized understanding;Returned demonstration;Verbal cues required          PT Short Term Goals - 06/23/17 1639      PT SHORT TERM GOAL #1   Title Patient will increase his passive left elbow flexion by 15 degrees without pain    Time 4   Period Weeks   Status On-going     PT SHORT TERM GOAL #2   Title Patient will increase his passive left elbow extnesion to full with full supination    Time 4   Period Weeks   Status On-going     PT SHORT TERM GOAL #3   Title Patient will increase his left grip strength by 15 lbs    Time 4   Period Weeks   Status On-going     PT SHORT TERM GOAL #4   Title Patient will report decreased tenderness to palaption in his bicpes and medial elbow    Time 4   Period Weeks   Status On-going           PT Long Term Goals - 06/26/17 1232      PT LONG TERM GOAL #1   Title Patient will be independent with exercise program for dominant arm.    Baseline perfroming exercises but has not progressed to functional activity    Time 4   Period Weeks   Status On-going     PT LONG TERM GOAL #2   Title Patient will increase gross left elbow strength to 5/5 in order to perform ADL's without pain   Time 8   Period Weeks   Status On-going     PT LONG TERM GOAL #3   Title Patient will demsotrate a 26% limitation in FOTO.    Time 8   Period Weeks   Status New               Plan - 06/30/17 2150    Clinical Impression Statement Therapy advanced the aptients exercises. Therapy will assess patients  tolerance to exercises. If he tolerates well therapy may discharge to HEP. If he feels more sore he may continue a few more visits. No trigger points flet so needling not perfromed.    History and Personal Factors relevant to plan of care: CHF    Clinical Presentation Evolving  Clinical Decision Making Moderate   Rehab Potential Good   PT Frequency 2x / week   PT Duration 8 weeks   PT Treatment/Interventions ADLs/Self Care Home Management;Cryotherapy;Electrical Stimulation;Stair training;Gait training;Patient/family education;Therapeutic exercise;Therapeutic activities;Manual techniques;Passive range of motion;Dry needling;Taping;Splinting   PT Next Visit Plan review HEP; assess motion of the elbow; manual therapy to improve elbow flexion, extension and supination, consider ultraspund    PT Home Exercise Plan wrist flexion stretch; elbow flexion stretch with towel, towel squeeze, elbow supination stretch   Consulted and Agree with Plan of Care Patient      Patient will benefit from skilled therapeutic intervention in order to improve the following deficits and impairments:  Decreased strength, Pain, Decreased range of motion, Impaired flexibility, Decreased activity tolerance  Visit Diagnosis: Pain in left elbow  Stiffness of left elbow, not elsewhere classified  Muscle weakness (generalized)     Problem List Patient Active Problem List   Diagnosis Date Noted  . RUQ pain 05/06/2017  . Decreased range of motion of left elbow 04/15/2017  . Hypertriglyceridemia 03/17/2017  . Elbow pain, left 03/17/2017  . Right rib fracture   . Syncope 07/19/2016  . Thrombocytopenia ([place]) 06/29/2016  . Hoarseness, chronic 06/29/2016  . Preventative health care 06/29/2016  . Midline low back pain without sciatica 12/09/2015  . Cirrhosis with alcoholism ([place]) 08/08/2015  . Pancytopenia ([place]) 06/10/2015  . Dermatitis 06/10/2015  . OSA (obstructive sleep apnea) 02/13/2015  . Gout 07/30/2014  .  Diarrhea 09/04/2013  . Chronic renal insufficiency, stage III (moderate) 07/18/2013  . Right heart failure due to pulmonary hypertension ([place]) 06/06/2013  . Hypothyroidism 01/25/2013  . PAH (pulmonary arterial hypertension) with portal hypertension ([place]) 01/21/2013  . Allergic rhinitis 12/21/2012  . Secondary pulmonary hypertension 12/09/2012  . Hepatic cirrhosis ([place]) 11/26/2012  . COPD bronchitis 11/01/2012  . GERD (gastroesophageal reflux disease) 09/05/2011  . Hypokalemia 08/29/2011  . Colon polyps 03/26/2011  . Insomnia 03/26/2011  . Hypertension 03/22/2011  . Alcohol dependence ([place]) 03/22/2011    Carney Living PT DPT  07/01/2017, 1:56 PM  [facility] [address] [place], [place], [address] Phone: [phone]   Fax:  [phone]  Name: Darren Mueller MRN: [id number] Date of Birth: 1951-04-22

## 2017-07-01 NOTE — Progress Notes (Signed)
Loop Recorder Summary Report

## 2017-07-02 ENCOUNTER — Ambulatory Visit: Payer: Medicare Other | Admitting: Physical Therapy

## 2017-07-02 DIAGNOSIS — M6281 Muscle weakness (generalized): Secondary | ICD-10-CM | POA: Diagnosis not present

## 2017-07-02 DIAGNOSIS — M25622 Stiffness of left elbow, not elsewhere classified: Secondary | ICD-10-CM

## 2017-07-02 DIAGNOSIS — M25522 Pain in left elbow: Secondary | ICD-10-CM

## 2017-07-03 NOTE — Therapy (Addendum)
[place], [place], [address] Phone: [phone]   Fax:  [phone]  Physical Therapy Treatment/ Discharge   Patient Details  Name: Darren Mueller MRN: [id number] Date of Birth: 1951-04-05 Referring Provider: Dr Hulan Saas   Encounter Date: 07/02/2017      PT End of Session - 07/03/17 0758    Visit Number 10   Number of Visits 16   Date for PT Re-Evaluation 08/04/17   PT Start Time 0301   PT Stop Time 0342   PT Time Calculation (min) 41 min   Activity Tolerance Patient tolerated treatment well   Behavior During Therapy [facility] for tasks assessed/performed      Past Medical History:  Diagnosis Date  . Alcohol dependence ([place]) 03/22/2011   In remission   . Alcohol dependence in remission ([place]) 03/22/2011   In remission   . Alcoholic cirrhosis of liver with ascites ([place]) 2014  . Dermatitis 06/10/2015  . Diarrhea 09/04/2013  . Elbow pain, left 03/17/2017  . GERD (gastroesophageal reflux disease)   . Gout   . Heart murmur   . Hx of colonic polyps   . Hypertension   . Hypertriglyceridemia 03/17/2017  . Hypopotassemia   . Otalgia 11/28/2013  . Other chronic pulmonary heart diseases   . Preventative health care 06/29/2016  . Red eye 03/03/2016   right  . Renal insufficiency 07/18/2013  . Seizures ([place])   . Sleep apnea    does not wear CPAP  . Thrombocytopenia ([place]) 06/29/2016  . Unspecified hypothyroidism 01/25/2013  . Unspecified pleural effusion     Past Surgical History:  Procedure Laterality Date  . CARDIAC CATHETERIZATION N/A 12/21/2015   Procedure: Right Heart Cath;  Surgeon: Jolaine Artist, MD;  Location: [place] [facility];  Service: Cardiovascular;  Laterality: N/A;  . CARDIAC CATHETERIZATION N/A 04/15/2016   Procedure: Right/Left Heart Cath and Coronary Angiography;  Surgeon: Jolaine Artist, MD;  Location: [place] [facility];  Service: Cardiovascular;  Laterality: N/A;  . COLONOSCOPY  N/A 12/08/2013   Procedure: COLONOSCOPY;  Surgeon: Milus Banister, MD;  [facility];  Service: Endoscopy;  Laterality: N/A;  . ESOPHAGOGASTRODUODENOSCOPY (EGD) WITH PROPOFOL N/A 03/27/2016   Procedure: ESOPHAGOGASTRODUODENOSCOPY (EGD) WITH PROPOFOL;  Surgeon: Milus Banister, MD;  Location: [place];  Service: Endoscopy;  Laterality: N/A;  . LOOP RECORDER INSERTION N/A 01/01/2017   Procedure: Loop Recorder Insertion;  Surgeon: Thompson Grayer, MD;  Location: [place] [facility];  Service: Cardiovascular;  Laterality: N/A;  . RIGHT HEART CATHETERIZATION Right 06/06/2014   Procedure: RIGHT HEART CATH;  Surgeon: Jolaine Artist, MD;  Location: [facility];  Service: Cardiovascular;  Laterality: Right;  . UVULOPALATOPHARYNGOPLASTY  1999    There were no vitals filed for this visit.      Subjective Assessment - 07/03/17 0752    Subjective Patient is making progress. He continues to feel slight pain at end range. He feels like he can perfrom his exercises at home.    Limitations Standing;Walking   Patient Stated Goals Stop hurting, reach my head to fix my hair   Currently in Pain? Yes   Pain Score 2    Pain Location Elbow   Pain Orientation Left   Pain Descriptors / Indicators Aching   Pain Type Acute pain   Pain Radiating Towards into the medial forarm    Pain Onset More than a month ago   Pain Frequency Constant   Aggravating Factors  reaching behind the head    Pain Relieving Factors rest   Effect of Pain on Daily Activities difficulty using the elbow                          [facility] Adult PT Treatment/Exercise - 07/03/17 0001      Elbow Exercises   Other elbow exercises wrist flexion stretch 3x20 sec hold; Bicpes flexion stretch with with towel 2x20 second hold; Towel grip 2x10; self supination stretch 3x15 second holds.    Other elbow exercises supination 4lb weight; elbow flexion 4lb 2x10; wrist flexion 4lb 2x10      Shoulder Exercises: Standing   Other  Standing Exercises shoulder extension 2x10 red; scap retraction 2x10; punch 2x10      Manual Therapy   Manual therapy comments Ulnar and radial glides to improve flexion and extension; supination streting with radial glides; Soft tissue mobilization to the medial epicondyle and biceps; mulligan flexion mobilization; IASTYM to medial  bicpes.                 PT Education - 07/03/17 0758    Education provided Yes   Education Details reviewed HEP    Person(s) Educated Patient   Methods Explanation;Demonstration   Comprehension Verbalized understanding;Returned demonstration;Verbal cues required;Tactile cues required          PT Short Term Goals - 07/03/17 0806      PT SHORT TERM GOAL #1   Title Patient will increase his passive left elbow flexion by 15 degrees without pain    Baseline 110 degrees with minor pain    Time 4   Period Weeks   Status Achieved     PT SHORT TERM GOAL #2   Title Patient will increase his passive left elbow extnesion to full with full supination    Time 4   Period Weeks   Status On-going     PT SHORT TERM GOAL #3   Title Patient will increase his left grip strength by 15 lbs    Time 4   Period Weeks   Status On-going     PT SHORT TERM GOAL #4   Title Patient will report decreased tenderness to palaption in his bicpes and medial elbow    Time 4   Period Weeks   Status On-going           PT Long Term Goals - 06/26/17 1232      PT LONG TERM GOAL #1   Title Patient will be independent with exercise program for dominant arm.    Baseline perfroming exercises but has not progressed to functional activity    Time 4   Period Weeks   Status On-going     PT LONG TERM GOAL #2   Title Patient will increase gross left elbow strength to 5/5 in order to perform ADL's without pain   Time 8   Period Weeks   Status On-going     PT LONG TERM GOAL #3   Title Patient will demsotrate a 26% limitation in FOTO.    Time 8   Period Weeks    Status New               Plan - 07/03/17 0759    Clinical Impression Statement Patient has made progress. His FOTO has improved but only slightly., He continues to have pain at end range but it is improving. he will continue home treatment iuntil he sees the MD. If  he feels like he is backtracking in that time period he will come back for a follow up.    Clinical Presentation Evolving   Clinical Decision Making Moderate   Rehab Potential Good   PT Frequency 2x / week   PT Duration 8 weeks   PT Treatment/Interventions ADLs/Self Care Home Management;Cryotherapy;Electrical Stimulation;Stair training;Gait training;Patient/family education;Therapeutic exercise;Therapeutic activities;Manual techniques;Passive range of motion;Dry needling;Taping;Splinting   PT Next Visit Plan review HEP; assess motion of the elbow; manual therapy to improve elbow flexion, extension and supination, consider ultraspund    PT Home Exercise Plan wrist flexion stretch; elbow flexion stretch with towel, towel squeeze, elbow supination stretch   Consulted and Agree with Plan of Care Patient      Patient will benefit from skilled therapeutic intervention in order to improve the following deficits and impairments:  Decreased strength, Pain, Decreased range of motion, Impaired flexibility, Decreased activity tolerance  Visit Diagnosis: Pain in left elbow  Stiffness of left elbow, not elsewhere classified  Muscle weakness (generalized)     Problem List Patient Active Problem List   Diagnosis Date Noted  . RUQ pain 05/06/2017  . Decreased range of motion of left elbow 04/15/2017  . Hypertriglyceridemia 03/17/2017  . Elbow pain, left 03/17/2017  . Right rib fracture   . Syncope 07/19/2016  . Thrombocytopenia ([place]) 06/29/2016  . Hoarseness, chronic 06/29/2016  . Preventative health care 06/29/2016  . Midline low back pain without sciatica 12/09/2015  . Cirrhosis with alcoholism ([place]) 08/08/2015  .  Pancytopenia ([place]) 06/10/2015  . Dermatitis 06/10/2015  . OSA (obstructive sleep apnea) 02/13/2015  . Gout 07/30/2014  . Diarrhea 09/04/2013  . Chronic renal insufficiency, stage III (moderate) 07/18/2013  . Right heart failure due to pulmonary hypertension ([place]) 06/06/2013  . Hypothyroidism 01/25/2013  . PAH (pulmonary arterial hypertension) with portal hypertension ([place]) 01/21/2013  . Allergic rhinitis 12/21/2012  . Secondary pulmonary hypertension 12/09/2012  . Hepatic cirrhosis ([place]) 11/26/2012  . COPD bronchitis 11/01/2012  . GERD (gastroesophageal reflux disease) 09/05/2011  . Hypokalemia 08/29/2011  . Colon polyps 03/26/2011  . Insomnia 03/26/2011  . Hypertension 03/22/2011  . Alcohol dependence ([place]) 03/22/2011   PHYSICAL THERAPY DISCHARGE SUMMARY  Visits from Start of Care: 10  Current functional level related to goals / functional outcomes: Improved motion and pain    Remaining deficits: Minor pain at end range    Education / Equipment: HEP  Plan: Patient agrees to discharge.  Patient goals were not met. Patient is being discharged due to meeting the stated rehab goals.  ?????      Carney Living  PT DPT  07/03/2017, 8:14 AM  [facility] [address] [place], [place], [address] Phone: [phone]   Fax:  [phone]  Name: Darren Mueller MRN: [id number] Date of Birth: August 20, 1951

## 2017-07-13 MED FILL — ROSUVASTATIN CALCIUM 5 MG T: 5 | 30 days supply | Qty: 30 | Fill #0

## 2017-07-14 ENCOUNTER — Other Ambulatory Visit (HOSPITAL_COMMUNITY): Payer: Self-pay | Admitting: Pharmacist

## 2017-07-14 MED ORDER — TADALAFIL (PAH) 20 MG PO TABS: 40.0000 mg | ORAL_TABLET | Freq: Every day | ORAL | 11 refills | Status: DC

## 2017-07-15 ENCOUNTER — Other Ambulatory Visit: Payer: Self-pay | Admitting: Family Medicine

## 2017-07-15 MED FILL — TADALAFIL (PAH) 20 MG TABS: 20 | 30 days supply | Qty: 60 | Fill #3

## 2017-07-20 ENCOUNTER — Telehealth: Payer: Self-pay | Admitting: Family Medicine

## 2017-07-20 NOTE — Telephone Encounter (Signed)
Pt called in, he said that he is having a gout flare up and  his medication allopurinol Isn't working for him. Pt would like to have something else called in to pharmacy. Please assist further.    Pharmacy :    [facility]

## 2017-07-20 NOTE — Telephone Encounter (Signed)
Please advise on another medication.   PC

## 2017-07-20 NOTE — Telephone Encounter (Signed)
He can increase his Allopurinol to 200 mg daily and hydrate well. Use lidosaine gel prn. And can add Colchicine 0.6 mg tabs, 1 tab po bid prn pain x 5 days

## 2017-07-21 ENCOUNTER — Encounter: Payer: Medicare Other | Admitting: Medical

## 2017-07-22 ENCOUNTER — Ambulatory Visit: Payer: Medicare Other | Admitting: Family Medicine

## 2017-07-22 ENCOUNTER — Other Ambulatory Visit: Payer: Self-pay | Admitting: Family Medicine

## 2017-07-22 MED ORDER — ALLOPURINOL 100 MG PO TABS: 200.0000 mg | ORAL_TABLET | Freq: Every day | ORAL | 1 refills | Status: DC

## 2017-07-22 MED ORDER — COLCHICINE 0.6 MG PO TABS: 0.6000 mg | ORAL_TABLET | Freq: Two times a day (BID) | ORAL | 1 refills | Status: DC

## 2017-07-22 NOTE — Telephone Encounter (Signed)
Please let him know I sent in the increased amount of allopurinol and the Colchicine. Please give him the other advice please

## 2017-07-22 NOTE — Telephone Encounter (Addendum)
Relation to JS:IDXF Call back number:[phone] Pharmacy: [place], [place] - [address] [phone] (Phone) [phone] (Fax)     Reason for call:  Patient checking on the status of message below, please advise

## 2017-07-23 ENCOUNTER — Encounter: Payer: Self-pay | Admitting: Family Medicine

## 2017-07-23 ENCOUNTER — Ambulatory Visit (INDEPENDENT_AMBULATORY_CARE_PROVIDER_SITE_OTHER): Payer: Medicare Other | Admitting: Family Medicine

## 2017-07-23 DIAGNOSIS — M1A9XX0 Chronic gout, unspecified, without tophus (tophi): Secondary | ICD-10-CM | POA: Diagnosis not present

## 2017-07-23 DIAGNOSIS — M25622 Stiffness of left elbow, not elsewhere classified: Secondary | ICD-10-CM

## 2017-07-23 MED ORDER — PREDNISONE 50 MG PO TABS
50.0000 mg | ORAL_TABLET | Freq: Every day | ORAL | 0 refills | Status: DC
Start: 2017-07-23 — End: 2017-08-18

## 2017-07-23 MED ORDER — ALLOPURINOL 300 MG PO TABS: 300.0000 mg | ORAL_TABLET | Freq: Every day | ORAL | 3 refills | Status: DC

## 2017-07-23 MED FILL — ALLOPURINOL 300 MG TABS: 300 | 30 days supply | Qty: 30 | Fill #0

## 2017-07-23 MED FILL — predniSONE 50 MG TABS: 50 | 5 days supply | Qty: 5 | Fill #0

## 2017-07-23 MED FILL — COLCHICINE 0.6 MG TABLET: 0.6 | 10 days supply | Qty: 20 | Fill #0

## 2017-07-23 NOTE — Telephone Encounter (Signed)
Spoke with patient he has been made aware of results.   PC

## 2017-07-23 NOTE — Assessment & Plan Note (Signed)
Improved with full range of motion at this time. Patient will likely do well. Continuing the conservative therapy as needed.

## 2017-07-23 NOTE — Patient Instructions (Signed)
Good to see you  You will do fine in the long run  Prednsione daily for 5 days pennsaid pinkie amount topically 2 times daily as needed.  Allopurinol now 331m daily  See me again in 1 week to make sure gone (OK to double book)

## 2017-07-23 NOTE — Progress Notes (Signed)
[facility] [address], [place] [address] Phone: [phone] Subjective:    I'm seeing this patient by the request  of:    CC: Left elbow follow-up, new foot pain  RRN:[id number]  Israel Werts is a 66 y.o. male coming in with complaint of left elbow pain. Patient was seen previously and did have decreasing range of motion as well as what seemed to be a bicep tendinitis. Completely resolved at this time.  Patient is having more left foot pain. Seems to be the first toe. Has history of gout and feels like this is a exacerbation. Light sensation even hurts. Patient has not been following any type of diet restrictions. Taking allopurinol regularly     Past Medical History:  Diagnosis Date  . Alcohol dependence ([place]) 03/22/2011   In remission   . Alcohol dependence in remission ([place]) 03/22/2011   In remission   . Alcoholic cirrhosis of liver with ascites ([place]) 2014  . Dermatitis 06/10/2015  . Diarrhea 09/04/2013  . Elbow pain, left 03/17/2017  . GERD (gastroesophageal reflux disease)   . Gout   . Heart murmur   . Hx of colonic polyps   . Hypertension   . Hypertriglyceridemia 03/17/2017  . Hypopotassemia   . Otalgia 11/28/2013  . Other chronic pulmonary heart diseases   . Preventative health care 06/29/2016  . Red eye 03/03/2016   right  . Renal insufficiency 07/18/2013  . Seizures ([place])   . Sleep apnea    does not wear CPAP  . Thrombocytopenia ([place]) 06/29/2016  . Unspecified hypothyroidism 01/25/2013  . Unspecified pleural effusion    Past Surgical History:  Procedure Laterality Date  . CARDIAC CATHETERIZATION N/A 12/21/2015   Procedure: Right Heart Cath;  Surgeon: Jolaine Artist, MD;  Location: [facility];  Service: Cardiovascular;  Laterality: N/A;  . CARDIAC CATHETERIZATION N/A 04/15/2016   Procedure: Right/Left Heart Cath and Coronary Angiography;  Surgeon: Jolaine Artist, MD;  Location: [facility];  Service:  Cardiovascular;  Laterality: N/A;  . COLONOSCOPY N/A 12/08/2013   Procedure: COLONOSCOPY;  Surgeon: Milus Banister, MD;  Location: [facility];  Service: Endoscopy;  Laterality: N/A;  . ESOPHAGOGASTRODUODENOSCOPY (EGD) WITH PROPOFOL N/A 03/27/2016   Procedure: ESOPHAGOGASTRODUODENOSCOPY (EGD) WITH PROPOFOL;  Surgeon: Milus Banister, MD;  Location: [facility];  Service: Endoscopy;  Laterality: N/A;  . LOOP RECORDER INSERTION N/A 01/01/2017   Procedure: Loop Recorder Insertion;  Surgeon: Thompson Grayer, MD;  Location: [facility];  Service: Cardiovascular;  Laterality: N/A;  . RIGHT HEART CATHETERIZATION Right 06/06/2014   Procedure: RIGHT HEART CATH;  Surgeon: Jolaine Artist, MD;  Location: [facility];  Service: Cardiovascular;  Laterality: Right;  . UVULOPALATOPHARYNGOPLASTY  1999   Social History   Social History  . Marital status: Divorced    Spouse name: N/A  . Number of children: 3  . Years of education: N/A   Occupational History  . Retired     Social research officer, government   Social History Main Topics  . Smoking status: Former Smoker    Packs/day: 2.00    Years: 5.00    Types: Cigarettes    Quit date: 09/22/1979  . Smokeless tobacco: Never Used  . Alcohol use 3.6 oz/week    6 Shots of liquor per week     Comment: 12/31/2016 " "2 vodka & tonics on MWF"  . Drug use: No  . Sexual activity: Not Currently  Birth control/ protection: Spermicide   Other Topics Concern  . None   Social History Narrative   0 caffeine drinks daily    Allergies  Allergen Reactions  . Aspirin Other (See Comments)    hypertension  . Nitroglycerin     Headache   Family History  Problem Relation Age of Onset  . Heart disease Mother   . Heart attack Mother   . Hypertension Mother   . Prostate cancer Father   . Colon cancer Neg Hx      Past medical history, social, surgical and family history all reviewed in electronic medical record.  No pertanent information unless stated regarding to the  chief complaint.   Review of Systems:Review of systems updated and as accurate as of 07/23/17  No headache, visual changes, nausea, vomiting, diarrhea, constipation, dizziness, abdominal pain, skin rash, fevers, chills, night sweats, weight loss, swollen lymph nodes, body aches,  muscle aches, chest pain, shortness of breath, mood changes. Positive joint swelling  Objective  Blood pressure 118/80, pulse 83, height 6' (1.829 m), weight 202 lb (91.6 kg), SpO2 97 %. Systems examined below as of 07/23/17   General: No apparent distress alert and oriented x3 mood and affect normal, dressed appropriately.  HEENT: Pupils equal, extraocular movements intact  Respiratory: Patient's speak in full sentences and does not appear short of breath  Cardiovascular: No lower extremity edema, non tender, no erythema  Skin: Warm dry intact with no signs of infection or rash on extremities or on axial skeleton.  Abdomen: Soft nontender  Neuro: Cranial nerves II through XII are intact, neurovascularly intact in all extremities with 2+ DTRs and 2+ pulses.  Lymph: No lymphadenopathy of posterior or anterior cervical chain or axillae bilaterally.  Gait normal with good balance and coordination.  MSK:  Non tender with full range of motion and good stability and symmetric strength and tone of shoulders,  wrist, hip, knee and ankles bilaterally.  Elbow: Left Unremarkable to inspection. Range of motion full pronation, supination, flexion, extension. Strength is full to all of the above directions Stable to varus, valgus stress. Negative moving valgus stress test. No discrete areas of tenderness to palpation. Ulnar nerve does not sublux. Negative cubital tunnel Tinel's.  contralateral elbow unremarkable  Left foot exam shows the patient does have some swelling of the foot itself. Severely swollen over the first metatarsal. Patient is even tender to light palpation with some redness. Warm to touch. No sign of  infection or any type of skin breakdown. Impression and Recommendations:     This case required medical decision making of moderate complexity.      Note: This dictation was prepared with Dragon dictation along with smaller phrase technology. Any transcriptional errors that result from this process are unintentional.

## 2017-07-23 NOTE — Assessment & Plan Note (Signed)
Exacerbation of the foot. Prednisone given and warned of potential side effects. We discussed icing regimen, discussed diet changes, given topical anti-inflammatories. Increased on allopurinol 300 mg. Follow-up again in one week if not completely resolved into injection.

## 2017-07-30 ENCOUNTER — Ambulatory Visit (INDEPENDENT_AMBULATORY_CARE_PROVIDER_SITE_OTHER): Payer: Medicare Other | Admitting: Family Medicine

## 2017-07-30 ENCOUNTER — Ambulatory Visit (INDEPENDENT_AMBULATORY_CARE_PROVIDER_SITE_OTHER): Payer: Medicare Other | Admitting: *Deleted

## 2017-07-30 ENCOUNTER — Encounter: Payer: Self-pay | Admitting: Family Medicine

## 2017-07-30 DIAGNOSIS — R55 Syncope and collapse: Secondary | ICD-10-CM | POA: Diagnosis not present

## 2017-07-30 DIAGNOSIS — M1A9XX0 Chronic gout, unspecified, without tophus (tophi): Secondary | ICD-10-CM | POA: Diagnosis not present

## 2017-07-30 NOTE — Assessment & Plan Note (Signed)
Patient does have gout. Is doing better though. No change in management

## 2017-07-30 NOTE — Progress Notes (Signed)
[facility] [address], [place] [address] Phone: [phone] Subjective:    I'm seeing this patient by the request  of:    CC: Foot pain follow-up  [id number]  Darren Mueller is a 66 y.o. male coming in with complaint of foot pain.  Patient sent have what appeared to be a gout flare. Started on allopurinol as well as given prednisone. Patient states 100% better. No pain      Past Medical History:  Diagnosis Date  . Alcohol dependence ([place]) 03/22/2011   In remission   . Alcohol dependence in remission ([place]) 03/22/2011   In remission   . Alcoholic cirrhosis of liver with ascites ([place]) 2014  . Dermatitis 06/10/2015  . Diarrhea 09/04/2013  . Elbow pain, left 03/17/2017  . GERD (gastroesophageal reflux disease)   . Gout   . Heart murmur   . Hx of colonic polyps   . Hypertension   . Hypertriglyceridemia 03/17/2017  . Hypopotassemia   . Otalgia 11/28/2013  . Other chronic pulmonary heart diseases   . Preventative health care 06/29/2016  . Red eye 03/03/2016   right  . Renal insufficiency 07/18/2013  . Seizures ([place])   . Sleep apnea    does not wear CPAP  . Thrombocytopenia ([place]) 06/29/2016  . Unspecified hypothyroidism 01/25/2013  . Unspecified pleural effusion    Past Surgical History:  Procedure Laterality Date  . CARDIAC CATHETERIZATION N/A 12/21/2015   Procedure: Right Heart Cath;  Surgeon: Jolaine Artist, MD;  Location: [facility];  Service: Cardiovascular;  Laterality: N/A;  . CARDIAC CATHETERIZATION N/A 04/15/2016   Procedure: Right/Left Heart Cath and Coronary Angiography;  Surgeon: Jolaine Artist, MD;  Location: [facility];  Service: Cardiovascular;  Laterality: N/A;  . COLONOSCOPY N/A 12/08/2013   Procedure: COLONOSCOPY;  Surgeon: Milus Banister, MD;  [facility];  Service: Endoscopy;  Laterality: N/A;  . ESOPHAGOGASTRODUODENOSCOPY (EGD) WITH PROPOFOL N/A 03/27/2016   Procedure:  ESOPHAGOGASTRODUODENOSCOPY (EGD) WITH PROPOFOL;  Surgeon: Milus Banister, MD;  Location: [facility];  Service: Endoscopy;  Laterality: N/A;  . LOOP RECORDER INSERTION N/A 01/01/2017   Procedure: Loop Recorder Insertion;  Surgeon: Thompson Grayer, MD;  Location: [facility];  Service: Cardiovascular;  Laterality: N/A;  . RIGHT HEART CATHETERIZATION Right 06/06/2014   Procedure: RIGHT HEART CATH;  Surgeon: Jolaine Artist, MD;  Location: [facility];  Service: Cardiovascular;  Laterality: Right;  . UVULOPALATOPHARYNGOPLASTY  1999   Social History   Social History  . Marital status: Divorced    Spouse name: N/A  . Number of children: 3  . Years of education: N/A   Occupational History  . Retired     Social research officer, government   Social History Main Topics  . Smoking status: Former Smoker    Packs/day: 2.00    Years: 5.00    Types: Cigarettes    Quit date: 09/22/1979  . Smokeless tobacco: Never Used  . Alcohol use 3.6 oz/week    6 Shots of liquor per week     Comment: 12/31/2016 " "2 vodka & tonics on MWF"  . Drug use: No  . Sexual activity: Not Currently    Birth control/ protection: Spermicide   Other Topics Concern  . Not on file   Social History Narrative   0 caffeine drinks daily    Allergies  Allergen Reactions  . Aspirin Other (See Comments)    hypertension  . Nitroglycerin  Headache   Family History  Problem Relation Age of Onset  . Heart disease Mother   . Heart attack Mother   . Hypertension Mother   . Prostate cancer Father   . Colon cancer Neg Hx      Past medical history, social, surgical and family history all reviewed in electronic medical record.  No pertanent information unless stated regarding to the chief complaint.   Review of Systems: No headache, visual changes, nausea, vomiting, diarrhea, constipation, dizziness, abdominal pain, skin rash, fevers, chills, night sweats, weight loss, swollen lymph nodes, body aches, joint swelling, muscle aches, chest  pain, shortness of breath, mood changes.    Objective  Blood pressure (!) 123/45.   Systems examined below as of 07/30/17 General: NAD A&O x3 mood, affect normal  HEENT: Pupils equal, extraocular movements intact no nystagmus Respiratory: not short of breath at rest or with speaking Cardiovascular: No lower extremity edema, non tender Skin: Warm dry intact with no signs of infection or rash on extremities or on axial skeleton. Abdomen: Soft nontender, no masses Neuro: Cranial nerves  intact, neurovascularly intact in all extremities with 2+ DTRs and 2+ pulses. Lymph: No lymphadenopathy appreciated today  Gait normal with good balance and coordination.  MSK: Non tender with full range of motion and good stability and symmetric strength and tone of shoulders, elbows, wrist,  knee hips and ankles bilaterally.  Mild attack changes  Left foot exam nontender. No swelling noted. Still degenerative changes as noted previously. Impression and Recommendations:     This case required medical decision making of moderate complexity.      Note: This dictation was prepared with Dragon dictation along with smaller phrase technology. Any transcriptional errors that result from this process are unintentional.

## 2017-07-31 LAB — CUP PACEART REMOTE DEVICE CHECK
MDC IDC PG IMPLANT DT: 20180301
MDC IDC SESS DTM: 20180927201029

## 2017-07-31 NOTE — Progress Notes (Signed)
[facility] Summary Report / Loop Recorder

## 2017-08-11 MED FILL — COLCHICINE 0.6 MG TABLET: 0.6 | 10 days supply | Qty: 20 | Fill #1

## 2017-08-11 MED FILL — ROSUVASTATIN CALCIUM 5 MG T: 5 | 30 days supply | Qty: 30 | Fill #1

## 2017-08-18 ENCOUNTER — Encounter (HOSPITAL_COMMUNITY): Payer: Self-pay | Admitting: Internal Medicine

## 2017-08-18 ENCOUNTER — Ambulatory Visit (HOSPITAL_COMMUNITY)
Admission: RE | Admit: 2017-08-18 | Discharge: 2017-08-18 | Disposition: A | Discharge status: Home / Self Care | Payer: Medicare Other | Source: Ambulatory Visit | Attending: Internal Medicine | Admitting: Internal Medicine

## 2017-08-18 VITALS — BP 141/89 | HR 81 | Wt 206.2 lb

## 2017-08-18 DIAGNOSIS — D61818 Other pancytopenia: Secondary | ICD-10-CM | POA: Diagnosis not present

## 2017-08-18 DIAGNOSIS — I2721 Secondary pulmonary arterial hypertension: Secondary | ICD-10-CM | POA: Diagnosis not present

## 2017-08-18 DIAGNOSIS — N183 Chronic kidney disease, stage 3 (moderate): Secondary | ICD-10-CM | POA: Insufficient documentation

## 2017-08-18 DIAGNOSIS — I5032 Chronic diastolic (congestive) heart failure: Secondary | ICD-10-CM | POA: Diagnosis not present

## 2017-08-18 DIAGNOSIS — G4733 Obstructive sleep apnea (adult) (pediatric): Secondary | ICD-10-CM | POA: Insufficient documentation

## 2017-08-18 DIAGNOSIS — S2231XA Fracture of one rib, right side, initial encounter for closed fracture: Secondary | ICD-10-CM | POA: Diagnosis not present

## 2017-08-18 DIAGNOSIS — J449 Chronic obstructive pulmonary disease, unspecified: Secondary | ICD-10-CM | POA: Diagnosis not present

## 2017-08-18 DIAGNOSIS — Z79899 Other long term (current) drug therapy: Secondary | ICD-10-CM | POA: Diagnosis not present

## 2017-08-18 DIAGNOSIS — I129 Hypertensive chronic kidney disease with stage 1 through stage 4 chronic kidney disease, or unspecified chronic kidney disease: Secondary | ICD-10-CM | POA: Diagnosis not present

## 2017-08-18 DIAGNOSIS — K766 Portal hypertension: Secondary | ICD-10-CM | POA: Diagnosis not present

## 2017-08-18 DIAGNOSIS — R55 Syncope and collapse: Secondary | ICD-10-CM | POA: Diagnosis not present

## 2017-08-18 DIAGNOSIS — K7031 Alcoholic cirrhosis of liver with ascites: Secondary | ICD-10-CM | POA: Diagnosis not present

## 2017-08-18 DIAGNOSIS — I2510 Atherosclerotic heart disease of native coronary artery without angina pectoris: Secondary | ICD-10-CM | POA: Diagnosis not present

## 2017-08-18 DIAGNOSIS — X58XXXA Exposure to other specified factors, initial encounter: Secondary | ICD-10-CM | POA: Insufficient documentation

## 2017-08-18 DIAGNOSIS — I10 Essential (primary) hypertension: Secondary | ICD-10-CM

## 2017-08-18 DIAGNOSIS — K7460 Unspecified cirrhosis of liver: Secondary | ICD-10-CM | POA: Insufficient documentation

## 2017-08-18 DIAGNOSIS — I739 Peripheral vascular disease, unspecified: Secondary | ICD-10-CM | POA: Diagnosis not present

## 2017-08-18 MED ORDER — SELEXIPAG 1000 MCG PO TABS: 1000.0000 ug | ORAL_TABLET | Freq: Every morning | ORAL | 3 refills | Status: DC

## 2017-08-18 MED ORDER — SELEXIPAG 1200 MCG PO TABS: 1200.0000 ug | ORAL_TABLET | Freq: Every evening | ORAL | 3 refills | Status: DC

## 2017-08-18 NOTE — Progress Notes (Signed)
ADVANCED [facility] CLINIC NOTE  Patient ID: Ryzen To, male   DOB: 1950-12-28, 66 y.o.   MRN: [id number] GI : Dr Christella Hartigan PCP: Dr Abner Greenspan  Subjective:    Mr. Degenhardt ("[name]") is a 66 yo with history of COPD, portopulmonary hypertension with RV failure, and ETOH cirrhosis.  Admitted in 6/17 with CP and SOB. Was diuresed. Troponins normal. Underwent R/L cath. Minimal CAD with moderate PAH and preserved cardiac output.   Admitted 06/2016 and 07/2016 with syncope.  On September admission found to have elevated ETOH level as well as R 6th rib fracture.   Wore 30 day monitory up until 08/28/16. No AF noted. No events.  9% tachycardia.   Admitted the end of February after syncopal event. LINQ placed.   Admitted 7/4 through 05/08/17 with volume depletion. Diuretics held and changed to as needed for weight 200 pounds or greater. Discharge weight was 194 pounds.   He returns for routine f/u. Overall doing ok. A few days ago son noticed him not breathing well and sent transmission via Linq monitor - no report yet. Spending a lot of time taking care of his elderly parents who are quite ill. Compliant with meds. Taking lasix Monday and Fridays. No edema, orthopnea or PND. Does all activities without too much problem. SOB with steps.Weight 203. Lives with his adult son. Taking all medications. Taking selexipeg 1000 bid. Occasional HAs. +flushing. Mild jaw claudication.    PAH meds 1) Macitnentan 10 mg daily 2) Adcirca 40 mg daily  3) Selexapeg 1000/800 daily  Studies:  ECHO 12/14 EF 55-60% Peak PA pressure 35. Severe RV dysfunction  ECHO 7/15 EF 60% RV moderately to severely dilated. Moderate HK RVSP 57mm HG  ECHO 6/16 EF 60% RV moderately to severely dilated. Severe HK RVSP ~65 mm HG. D-shaped septum Echo 2/17 LVEF 60-65% RV massively dilated. Flat septum. Severe HK. Moderate TR RVSP ~65. IVC small. No effusion  ECHO 01/01/2017 EF 50-55% Grade I DD. RV severely dilated. Peak PA pressure 62 mm  hg  Cath 6/17 Mid RCA lesion, 20% stenosed. Dist LAD lesion, 20% stenosed.  Ao = 107/70 (87) LV = 106/3/11 RA = 4 RV = 74/11 PA = 74/26 (45) PCW = 6 Fick cardiac output/index = 4.6.2.2 PVR = 8.5 Ao sat = 95% PA sat = 61%, 58%  RHC 2/17 RA = 11 RV = 80/16/19 PA = 83/61 (71) PCW = 9 Fick cardiac output/index = 5.6/2.6 PVR = 10.3 WU Ao sat = 94% PA sat = 65%, 68% High SVC sat = 65%  RHC 2/14: RA = 13  RV = 63/5/14  PA = 65/24 (41)  PCW = 6  Hepatic wedge = 21  Fick cardiac output/index = 3.8/1.8  PVR = 9.1  FA sat = 94%  PA sat = 58%, 56%  SVC = 54%  RHC 06/06/14 RA = 5  RV = 73/5/9  PA = 81/25 (46)  PCW = 7  Fick cardiac output/index = 6.0/2.8  PVR = 6.5 [place]  FA sat = 95%  PA sat = 71%, 67%  Unable to get hepatic wedge  PFTs 3/14 FEV1 2.02 L (58%) FVC 2.7L (54%) FEV1/FVC (89%) DLCO 53%  PFTs 1/17 FEV1 2.05 L (61%) FVC 2.48L (56%) DLCO 44%   Event Monitor - NSR 10/26/13.   Ab u/s 8/16: + cirrhosis/mild to moderate splenomegaly  6 min walk 01/10/13, 1290 feet  (01/10/14) = 1280 feet (380 m) (9/15) = 1350 feet (411 m) O2  sats ranged from 89-95% on room air, HR ranged from 100-129. (6/16) = 384 meters  (2/17) = 1250 feet (371m) (8/17) = 1320 feet      Current Outpatient Prescriptions on File Prior to Encounter  Medication Sig Dispense Refill  . Albuterol Sulfate (PROAIR RESPICLICK) 108 (90 Base) MCG/ACT AEPB Inhale 2 puffs into the lungs every 6 (six) hours as needed (SOB or wheezing).    Marland Kitchen allopurinol (ZYLOPRIM) 300 MG tablet Take 1 tablet (300 mg total) by mouth daily. 30 tablet 3  . colchicine 0.6 MG tablet Take 1 tablet (0.6 mg total) by mouth 2 (two) times daily. 20 tablet 1  . doxepin (SINEQUAN) 10 MG capsule Take 1-2 capsules (10-20 mg total) by mouth at bedtime as needed. 60 capsule 5  . Fluticasone Propionate 0.05 % LOTN Apply daily to affected area 3 Bottle 3  . fluticasone-salmeterol (ADVAIR HFA) 230-21  MCG/ACT inhaler Inhale 2 puffs into the lungs every morning.    . furosemide (LASIX) 40 MG tablet Take 40 mg (1 tab) once on Monday and Friday mornings. Only take if weight is 200 pounds or greater. 30 tablet 3  . levothyroxine (SYNTHROID, LEVOTHROID) 25 MCG tablet TAKE 1 TABLET DAILY 90 tablet 1  . loperamide (ANTI-DIARRHEAL) 2 MG capsule Take 4 mg by mouth as needed for diarrhea or loose stools (up to 8 capsules per day).     Marland Kitchen losartan (COZAAR) 50 MG tablet TAKE 1 TABLET DAILY 90 tablet 2  . Macitentan (OPSUMIT) 10 MG TABS Take 10 mg by mouth daily. 90 tablet 3  . Multiple Vitamin (MULTIVITAMIN WITH MINERALS) TABS tablet Take 1 tablet by mouth daily.    Marland Kitchen omeprazole (PRILOSEC) 40 MG capsule TAKE 1 CAPSULE DAILY 90 capsule 2  . potassium chloride (K-DUR,KLOR-CON) 10 MEQ tablet 2 tablets daily, extra 2 tablets on Monday and Friday only if you take lasix. 20 tablet 6  . rosuvastatin (CRESTOR) 5 MG tablet Take 1 tablet (5 mg total) by mouth daily. 30 tablet 5  . Selexipag (UPTRAVI) 1000 MCG TABS Take 1,000 mcg by mouth 2 (two) times daily. 180 tablet 3  . spironolactone (ALDACTONE) 50 MG tablet TAKE 1 TABLET DAILY 90 tablet 1  . SYMAX-SR 0.375 MG 12 hr tablet TAKE 1 TABLET TWICE A DAY 180 tablet 1  . tadalafil, PAH, (ADCIRCA) 20 MG tablet Take 2 tablets (40 mg total) by mouth daily. 60 tablet 11   No current facility-administered medications on file prior to encounter.     Objective:   Weight Range:  Vital Signs:   Pulse Rate:  [81] 81 (10/16 1142) BP: (141)/(89) 141/89 (10/16 1142) SpO2:  [100 %] 100 % (10/16 1142) Weight:  [206 lb 4 oz (93.6 kg)] 206 lb 4 oz (93.6 kg) (10/16 1142)    Wt Readings from Last 3 Encounters:  08/18/17 206 lb 4 oz (93.6 kg)  07/30/17 205 lb (93 kg)  07/23/17 202 lb (91.6 kg)    Physical Exam: General:  Well appearing. Mildly flushed No resp difficulty HEENT: normal Neck: supple. JVP 7. Carotids 2+ bilat; no bruits. No lymphadenopathy or thryomegaly  appreciated. Cor: PMI nondisplaced. Regular rate & rhythm. 2/6 TR prominent P2 Lungs: clear Abdomen: soft, nontender, nondistended. No hepatosplenomegaly. No bruits or masses. Good bowel sounds. Extremities: no cyanosis, clubbing, rash, edema Neuro: alert & orientedx3, cranial nerves grossly intact. moves all 4 extremities w/o difficulty. Affect pleasant    Assessment/Plan     1. PAH: Suspected portopulmonary  HTN in setting of ETOH cirrhosis. -Stable NYHA II-III - Remains on triple therapy. - Continue macitentan 10 daily, adcirca 40 mg daily, and increase selexipag to 1000 mcg/12032mcg with goal dosing 1600 bid. wice a day. - Will need at next visit (refuses today).  - Discussed need for complete ETOH cessation.  - Volume status stable. Continue lasix 2x/week and as needed to keep weight < 206 2. CKD III:  - BMET today.  3. Cirrhosis:  - Likely combination of RV failure and ETOH - As above, again recommended complete cessation of ETOH.  - Follows with Dr. Christella Hartigan.  4. Syncope - LINQ placed 01/01/17 . - Device interrogated personally today in clinic and no events  5. HTN:  - Slightly elevated. Will continue to follow.  6. OSA:  -Has mild OSA with AHI 12 and desats down to 82%.  - Intolerant CPAP.  7. Pancytopenia:  - Has also seen Dr. Clelia Croft in Hematology - felt like it is due to splenic sequestration.   Joab Carden, MD  8:09 PM        Follow up in 3 months. BMET today.  Greater than 50% of the (total minutes 25 ) visit spent in counseling/coordination of care regarding PAH , diuretics and diet.    Javien Tesch, NP-C  12:20 PM

## 2017-08-18 NOTE — Patient Instructions (Signed)
Take Selexipag 1,086mg in the AM and 1,2052m in the PM  Follow up with Dr.Bensimhon in 3 months

## 2017-08-31 ENCOUNTER — Ambulatory Visit (INDEPENDENT_AMBULATORY_CARE_PROVIDER_SITE_OTHER): Payer: Medicare Other | Admitting: *Deleted

## 2017-08-31 DIAGNOSIS — R55 Syncope and collapse: Secondary | ICD-10-CM

## 2017-08-31 NOTE — Progress Notes (Signed)
[facility] Summary Report / Loop Recorder

## 2017-09-03 ENCOUNTER — Other Ambulatory Visit: Payer: Self-pay | Admitting: Family Medicine

## 2017-09-03 LAB — CUP PACEART REMOTE DEVICE CHECK
Date Time Interrogation Session: 20181027204135
MDC IDC PG IMPLANT DT: 20180301

## 2017-09-03 MED FILL — DOXEPIN 10 MG CAPSULE: 10 | 30 days supply | Qty: 60 | Fill #1

## 2017-09-03 MED FILL — TADALAFIL (PAH) 20 MG TABS: 20 | 30 days supply | Qty: 60 | Fill #4

## 2017-09-03 MED FILL — ALLOPURINOL 300 MG TABS: 300 | 30 days supply | Qty: 30 | Fill #1

## 2017-09-04 ENCOUNTER — Other Ambulatory Visit: Payer: Self-pay | Admitting: Internal Medicine

## 2017-09-07 MED FILL — COLCHICINE 0.6 MG TABS: 0.6 | 10 days supply | Qty: 20 | Fill #0

## 2017-09-07 MED FILL — ROSUVASTATIN CALCIUM 5 MG T: 5 | 30 days supply | Qty: 30 | Fill #2

## 2017-09-16 ENCOUNTER — Telehealth: Payer: Self-pay | Admitting: Family Medicine

## 2017-09-16 NOTE — Telephone Encounter (Signed)
Pt called in he said that he is having some feet swelling. Pt was transferred to team health for triaging.   Pt says that he was advised to go to the hospital. Pt declined because he said that he don't feel that he need to. Pt says that he's not having any chest pain or any pain. Pt says that he use to have swelling a long time ago.     Scheduled ov per pt.

## 2017-09-16 NOTE — Telephone Encounter (Signed)
Patient Name: Darren Mueller  DOB: 05-12-1951    Initial Comment Caller states he is having diarrhea for two weeks and feet are swollen. He have laxis.    Nurse Assessment  Nurse: Jimmey Ralph, RN, Lissa Date/Time Eilene Ghazi Time): 09/16/2017 4:36:29 PM  Confirm and document reason for call. If symptomatic, describe symptoms. ---Caller states he is having diarrhea for two weeks and feet are swollen. He have laxis at night before I go to bed. My BP is good. I have urinated in past 12 hours and inside of mouth is moist. No fever. My feet look like elephant feet. Caller denies SOB. My feet started swelling sunday. It is hard to get my shoes on.  Does the patient have any new or worsening symptoms? ---Yes  Will a triage be completed? ---Yes  Related visit to physician within the last 2 weeks? ---No  Does the PT have any chronic conditions? (i.e. diabetes, asthma, etc.) ---Yes  List chronic conditions. ---I have heart computer chip to regulate my heart rate HTN  Is this a behavioral health or substance abuse call? ---No     Guidelines    Guideline Title Affirmed Question Affirmed Notes  Leg Swelling and Edema Entire foot is cool or blue in comparison to other side    Final Disposition User   Go to ED Now [place], RN, Lissa    Referrals  GO TO FACILITY OTHER - SPECIFY   Caller Disagree/Comply Comply  Caller Understands Yes  PreDisposition Did not know what to do

## 2017-09-17 ENCOUNTER — Ambulatory Visit (HOSPITAL_BASED_OUTPATIENT_CLINIC_OR_DEPARTMENT_OTHER)
Admission: RE | Admit: 2017-09-17 | Discharge: 2017-09-17 | Disposition: A | Discharge status: Home / Self Care | Payer: Medicare Other | Source: Ambulatory Visit | Attending: Medical | Admitting: Medical

## 2017-09-17 ENCOUNTER — Encounter: Payer: Self-pay | Admitting: Medical

## 2017-09-17 ENCOUNTER — Ambulatory Visit (INDEPENDENT_AMBULATORY_CARE_PROVIDER_SITE_OTHER): Payer: Medicare Other | Admitting: Medical

## 2017-09-17 ENCOUNTER — Other Ambulatory Visit (INDEPENDENT_AMBULATORY_CARE_PROVIDER_SITE_OTHER): Payer: Medicare Other

## 2017-09-17 ENCOUNTER — Ambulatory Visit: Payer: Medicare Other | Admitting: Medical

## 2017-09-17 VITALS — BP 125/74 | HR 77 | Temp 97.7°F | Resp 18 | Wt 211.2 lb

## 2017-09-17 DIAGNOSIS — R600 Localized edema: Secondary | ICD-10-CM | POA: Diagnosis not present

## 2017-09-17 DIAGNOSIS — R635 Abnormal weight gain: Secondary | ICD-10-CM

## 2017-09-17 DIAGNOSIS — R05 Cough: Secondary | ICD-10-CM

## 2017-09-17 DIAGNOSIS — I517 Cardiomegaly: Secondary | ICD-10-CM | POA: Diagnosis not present

## 2017-09-17 DIAGNOSIS — R059 Cough, unspecified: Secondary | ICD-10-CM

## 2017-09-17 LAB — CBC WITH DIFFERENTIAL/PLATELET
BASOS ABS: 0 10*3/uL (ref 0.0–0.1)
Basophils Relative: 0.4 % (ref 0.0–3.0)
EOS PCT: 2.1 % (ref 0.0–5.0)
Eosinophils Absolute: 0 10*3/uL (ref 0.0–0.7)
HCT: 35.8 % — ABNORMAL LOW (ref 39.0–52.0)
HEMOGLOBIN: 11.7 g/dL — AB (ref 13.0–17.0)
Lymphocytes Relative: 30.8 % (ref 12.0–46.0)
Lymphs Abs: 0.5 10*3/uL — ABNORMAL LOW (ref 0.7–4.0)
MCHC: 32.6 g/dL (ref 30.0–36.0)
MCV: 107.4 fl — ABNORMAL HIGH (ref 78.0–100.0)
MONO ABS: 0.3 10*3/uL (ref 0.1–1.0)
MONOS PCT: 18 % — AB (ref 3.0–12.0)
Neutro Abs: 0.8 10*3/uL — ABNORMAL LOW (ref 1.4–7.7)
Neutrophils Relative %: 48.7 % (ref 43.0–77.0)
Platelets: 44 10*3/uL — CL (ref 150.0–400.0)
RBC: 3.34 Mil/uL — AB (ref 4.22–5.81)
RDW: 14.9 % (ref 11.5–15.5)

## 2017-09-17 LAB — COMPREHENSIVE METABOLIC PANEL
AG Ratio: 1.3 (calc) (ref 1.0–2.5)
ALBUMIN MSPROF: 4.1 g/dL (ref 3.6–5.1)
ALKALINE PHOSPHATASE (APISO): 237 U/L — AB (ref 40–115)
ALT: 33 U/L (ref 9–46)
AST: 72 U/L — AB (ref 10–35)
BILIRUBIN TOTAL: 1.2 mg/dL (ref 0.2–1.2)
BUN/Creatinine Ratio: 21 (calc) (ref 6–22)
BUN: 35 mg/dL — ABNORMAL HIGH (ref 7–25)
CALCIUM: 8.7 mg/dL (ref 8.6–10.3)
CO2: 23 mmol/L (ref 20–32)
Chloride: 106 mmol/L (ref 98–110)
Creat: 1.63 mg/dL — ABNORMAL HIGH (ref 0.70–1.25)
Globulin: 3.1 g/dL (calc) (ref 1.9–3.7)
Glucose, Bld: 106 mg/dL — ABNORMAL HIGH (ref 65–99)
POTASSIUM: 4.7 mmol/L (ref 3.5–5.3)
Sodium: 139 mmol/L (ref 135–146)
Total Protein: 7.2 g/dL (ref 6.1–8.1)

## 2017-09-17 LAB — BRAIN NATRIURETIC PEPTIDE: Pro B Natriuretic peptide (BNP): 190 pg/mL — ABNORMAL HIGH (ref 0.0–100.0)

## 2017-09-17 NOTE — Patient Instructions (Signed)
For your recent significant pedal edema, 5 pound weight gain and cough lying supine we will get CBC, CMP and BNP.  Test ordered as stat.  Also please go downstairs and get chest x-ray.  You were advised yesterday to go to the ED when you called for an appointment.  Presently today during the exam I do not think that is necessary but will proceed with caution and if your symptoms worsen or you have significant findings on workup then might recommend ED evaluation.  If you get any pain behind your knees as I showed you during the interview then let me know.  In that event we would need to arrange for lower extremity ultrasounds.  If you get any redness warmth or tenderness to either leg let me know as well.  Presently I do not see any findings to indicate skin infection.  Follow-up in 3-4 days or as needed.  However will call you later today and advised on the lab results and possible medication changes.  We might need to increase your Lasix regimen.

## 2017-09-17 NOTE — Addendum Note (Signed)
Addended by: Caffie Pinto on: 09/17/2017 04:14 PM   Modules accepted: Orders

## 2017-09-17 NOTE — Addendum Note (Signed)
Addended by: Caffie Pinto on: 09/17/2017 03:55 PM   Modules accepted: Orders

## 2017-09-17 NOTE — Progress Notes (Signed)
Subjective:    Patient ID: Darren Mueller, male    DOB: December 06, 1950, 66 y.o.   MRN: [id number]  HPI  Pt in for swelling of his legs for 2 weeks. Pt states worse yesterday. Pt denies any shortness. Swelling is on both sides.  Pt has gained some weight since last month. 5 pounds per epic.  When he lays flat on his back will cough. Denies sob. He uses pillows to prop himself up so won't cough last 2 months.  Denies any chest pain.None over past 2 weeks.   Pt is on lasix. Epic instruction states lasix 40mg  Monday and Friday if weight over 200 lb.s  Pt has hx of pulmonary hypertension. LVEF was 50-55%    Review of Systems  Constitutional: Negative for chills, fatigue and fever.  HENT: Negative for congestion, ear pain, rhinorrhea, sinus pressure and sinus pain.   Respiratory: Positive for cough. Negative for chest tightness, shortness of breath and wheezing.        When lies supine  Cardiovascular: Negative for chest pain and palpitations.  Gastrointestinal: Negative for abdominal pain, nausea and vomiting.  Musculoskeletal: Negative for back pain.       Pedal edema.  Skin: Negative for rash.  Neurological: Negative for dizziness, seizures, weakness and headaches.  Hematological: Negative for adenopathy. Does not bruise/bleed easily.  Psychiatric/Behavioral: Negative for behavioral problems, confusion and suicidal ideas. The patient is not nervous/anxious.    Past Medical History:  Diagnosis Date  . Alcohol dependence (HCC) 03/22/2011   In remission   . Alcohol dependence in remission (HCC) 03/22/2011   In remission   . Alcoholic cirrhosis of liver with ascites (HCC) 2014  . Dermatitis 06/10/2015  . Diarrhea 09/04/2013  . Elbow pain, left 03/17/2017  . GERD (gastroesophageal reflux disease)   . Gout   . Heart murmur   . Hx of colonic polyps   . Hypertension   . Hypertriglyceridemia 03/17/2017  . Hypopotassemia   . Otalgia 11/28/2013  . Other chronic pulmonary heart diseases     . Preventative health care 06/29/2016  . Red eye 03/03/2016   right  . Renal insufficiency 07/18/2013  . Seizures (HCC)   . Sleep apnea    does not wear CPAP  . Thrombocytopenia (HCC) 06/29/2016  . Unspecified hypothyroidism 01/25/2013  . Unspecified pleural effusion      Social History   Socioeconomic History  . Marital status: Divorced    Spouse name: Not on file  . Number of children: 3  . Years of education: Not on file  . Highest education level: Not on file  Social Needs  . Financial resource strain: Not on file  . Food insecurity - worry: Not on file  . Food insecurity - inability: Not on file  . Transportation needs - medical: Not on file  . Transportation needs - non-medical: Not on file  Occupational History  . Occupation: Retired    Comment: Company secretary  Tobacco Use  . Smoking status: Former Smoker    Packs/day: 2.00    Years: 5.00    Pack years: 10.00    Types: Cigarettes    Last attempt to quit: 09/22/1979    Years since quitting: 38.0  . Smokeless tobacco: Never Used  Substance and Sexual Activity  . Alcohol use: Yes    Alcohol/week: 3.6 oz    Types: 6 Shots of liquor per week    Comment: 12/31/2016 " "2 vodka & tonics on MWF"  . Drug use: No  .  Sexual activity: Not Currently    Birth control/protection: Spermicide  Other Topics Concern  . Not on file  Social History Narrative   0 caffeine drinks daily     Past Surgical History:  Procedure Laterality Date  . CARDIAC CATHETERIZATION N/A 12/21/2015   Procedure: Right Heart Cath;  Surgeon: Dolores Patty, MD;  Location: [facility] INVASIVE CV LAB;  Service: Cardiovascular;  Laterality: N/A;  . CARDIAC CATHETERIZATION N/A 04/15/2016   Procedure: Right/Left Heart Cath and Coronary Angiography;  Surgeon: Dolores Patty, MD;  Location: [facility] INVASIVE CV LAB;  Service: Cardiovascular;  Laterality: N/A;  . COLONOSCOPY N/A 12/08/2013   Procedure: COLONOSCOPY;  Surgeon: Rachael Fee, MD;  Location: [facility] ENDOSCOPY;   Service: Endoscopy;  Laterality: N/A;  . ESOPHAGOGASTRODUODENOSCOPY (EGD) WITH PROPOFOL N/A 03/27/2016   Procedure: ESOPHAGOGASTRODUODENOSCOPY (EGD) WITH PROPOFOL;  Surgeon: Rachael Fee, MD;  Location: [facility] ENDOSCOPY;  Service: Endoscopy;  Laterality: N/A;  . LOOP RECORDER INSERTION N/A 01/01/2017   Procedure: Loop Recorder Insertion;  Surgeon: Hillis Range, MD;  Location: [facility] INVASIVE CV LAB;  Service: Cardiovascular;  Laterality: N/A;  . RIGHT HEART CATHETERIZATION Right 06/06/2014   Procedure: RIGHT HEART CATH;  Surgeon: Dolores Patty, MD;  Location: [facility] CATH LAB;  Service: Cardiovascular;  Laterality: Right;  . UVULOPALATOPHARYNGOPLASTY  1999    Family History  Problem Relation Age of Onset  . Heart disease Mother   . Heart attack Mother   . Hypertension Mother   . Prostate cancer Father   . Colon cancer Neg Hx     Allergies  Allergen Reactions  . Aspirin Other (See Comments)    hypertension  . Nitroglycerin     Headache    Current Outpatient Medications on File Prior to Visit  Medication Sig Dispense Refill  . Albuterol Sulfate (PROAIR RESPICLICK) 108 (90 Base) MCG/ACT AEPB Inhale 2 puffs into the lungs every 6 (six) hours as needed (SOB or wheezing).    Marland Kitchen allopurinol (ZYLOPRIM) 300 MG tablet Take 1 tablet (300 mg total) by mouth daily. 30 tablet 3  . colchicine 0.6 MG tablet TAKE 1 TABLET (0.6 MG TOTAL) BY MOUTH 2 (TWO) TIMES DAILY. 20 tablet 1  . doxepin (SINEQUAN) 10 MG capsule Take 1-2 capsules (10-20 mg total) by mouth at bedtime as needed. 60 capsule 5  . Fluticasone Propionate 0.05 % LOTN Apply daily to affected area 3 Bottle 3  . fluticasone-salmeterol (ADVAIR HFA) 230-21 MCG/ACT inhaler Inhale 2 puffs into the lungs every morning.    . furosemide (LASIX) 40 MG tablet Take 40 mg (1 tab) once on Monday and Friday mornings. Only take if weight is 200 pounds or greater. 30 tablet 3  . levothyroxine (SYNTHROID, LEVOTHROID) 25 MCG tablet TAKE 1 TABLET DAILY 90 tablet 1  .  loperamide (ANTI-DIARRHEAL) 2 MG capsule Take 4 mg by mouth as needed for diarrhea or loose stools (up to 8 capsules per day).     Marland Kitchen losartan (COZAAR) 50 MG tablet TAKE 1 TABLET DAILY 90 tablet 2  . Macitentan (OPSUMIT) 10 MG TABS Take 10 mg by mouth daily. 90 tablet 3  . Multiple Vitamin (MULTIVITAMIN WITH MINERALS) TABS tablet Take 1 tablet by mouth daily.    Marland Kitchen omeprazole (PRILOSEC) 40 MG capsule TAKE 1 CAPSULE DAILY 90 capsule 2  . potassium chloride (K-DUR,KLOR-CON) 10 MEQ tablet 2 tablets daily, extra 2 tablets on Monday and Friday only if you take lasix. 20 tablet 6  . rosuvastatin (CRESTOR) 5 MG tablet Take 1  tablet (5 mg total) by mouth daily. 30 tablet 5  . Selexipag (UPTRAVI) 1000 MCG TABS Take 1,000 mcg by mouth every morning. 90 tablet 3  . Selexipag 1200 MCG TABS Take 1,200 mcg by mouth every evening. 90 tablet 3  . spironolactone (ALDACTONE) 50 MG tablet TAKE 1 TABLET DAILY 90 tablet 1  . SYMAX-SR 0.375 MG 12 hr tablet TAKE 1 TABLET TWICE A DAY 180 tablet 1  . tadalafil, PAH, (ADCIRCA) 20 MG tablet Take 2 tablets (40 mg total) by mouth daily. 60 tablet 11   No current facility-administered medications on file prior to visit.     BP 125/74   Pulse 77   Temp 97.7 F (36.5 C) (Oral)   Resp 18   Wt 211 lb 3.2 oz (95.8 kg)   SpO2 99%   BMI 28.64 kg/m       Objective:   Physical Exam  General Mental Status- Alert. General Appearance- Not in acute distress.   Skin General: Color- Normal Color. Moisture- Normal Moisture.  Neck Carotid Arteries- Normal color. Moisture- Normal Moisture. No carotid bruits. No JVD.  Chest and Lung Exam Auscultation: Breath Sounds:-Normal.  Cardiovascular Auscultation:Rythm- Regular. Murmurs & Other Heart Sounds:Auscultation of the heart reveals- No Murmurs.  Abdomen Inspection:-Inspeection Normal. Palpation/Percussion:Note:No mass. Palpation and Percussion of the abdomen reveal- Non Tender, Non Distended + BS, no rebound or  guarding.    Neurologic Cranial Nerve exam:- CN III-XII intact(No nystagmus), symmetric smile. Strength:- 5/5 equal and symmetric strength both upper and lower extremities.  Lower extremities-bilateral pretibial 2+ edema.  Neither side feels red warm or tender presently.  Negative Homans sign.  Note the swelling in the pretibial region extends all the way down to his feet.  The swelling appears symmetric.    Assessment & Plan:  For your recent significant pedal edema, 5 pound weight gain and cough lying supine we will get CBC, CMP and BNP.  Test ordered as stat.  Also please go downstairs and get chest x-ray.  You were advised yesterday to go to the ED when you called for an appointment.  Presently today during the exam I do not think that is necessary but will proceed with caution and if your symptoms worsen or you have significant findings on workup then might recommend ED evaluation.  If you get any pain behind your knees as I showed you during the interview then let me know.  In that event we would need to arrange for lower extremity ultrasounds.  If you get any redness warmth or tenderness to either leg let me know as well.  Presently I do not see any findings to indicate skin infection.  Follow-up in 3-4 days or as needed.  However will call you later today and advised on the lab results and possible medication changes.  We might need to increase your Lasix regimen.  Jahkai Yandell, Ramon Dredge, PA-C

## 2017-09-22 ENCOUNTER — Ambulatory Visit (INDEPENDENT_AMBULATORY_CARE_PROVIDER_SITE_OTHER): Payer: Medicare Other | Admitting: Medical

## 2017-09-22 ENCOUNTER — Encounter: Payer: Self-pay | Admitting: Medical

## 2017-09-22 ENCOUNTER — Ambulatory Visit (HOSPITAL_BASED_OUTPATIENT_CLINIC_OR_DEPARTMENT_OTHER)
Admission: RE | Admit: 2017-09-22 | Discharge: 2017-09-22 | Disposition: A | Discharge status: Home / Self Care | Payer: Medicare Other | Source: Ambulatory Visit | Attending: Medical | Admitting: Medical

## 2017-09-22 VITALS — BP 112/68 | HR 84 | Temp 97.5°F | Resp 16 | Wt 207.6 lb

## 2017-09-22 DIAGNOSIS — R600 Localized edema: Secondary | ICD-10-CM

## 2017-09-22 DIAGNOSIS — I509 Heart failure, unspecified: Secondary | ICD-10-CM | POA: Diagnosis not present

## 2017-09-22 DIAGNOSIS — M7989 Other specified soft tissue disorders: Secondary | ICD-10-CM | POA: Diagnosis not present

## 2017-09-22 LAB — COMPREHENSIVE METABOLIC PANEL
ALK PHOS: 260 U/L — AB (ref 39–117)
ALT: 38 U/L (ref 0–53)
AST: 116 U/L — AB (ref 0–37)
Albumin: 3.8 g/dL (ref 3.5–5.2)
BUN: 48 mg/dL — ABNORMAL HIGH (ref 6–23)
CHLORIDE: 104 meq/L (ref 96–112)
CO2: 22 mEq/L (ref 19–32)
Calcium: 8.8 mg/dL (ref 8.4–10.5)
Creatinine, Ser: 2.22 mg/dL — ABNORMAL HIGH (ref 0.40–1.50)
GFR: 38.11 mL/min — ABNORMAL LOW (ref >60.00)
Glucose, Bld: 97 mg/dL (ref 70–99)
Potassium: 4.3 mEq/L (ref 3.5–5.1)
SODIUM: 134 meq/L — AB (ref 135–145)
Total Bilirubin: 1.2 mg/dL (ref 0.2–1.2)
Total Protein: 7 g/dL (ref 6.0–8.3)

## 2017-09-22 MED ORDER — CEPHALEXIN 500 MG PO CAPS
500.0000 mg | ORAL_CAPSULE | Freq: Three times a day (TID) | ORAL | 0 refills | Status: DC
Start: 2017-09-22 — End: 2017-10-01

## 2017-09-22 NOTE — Patient Instructions (Addendum)
For history of CHF, will get a CMP to check potassium level in light of recent dose adjustment on your Lasix.  Overall your legs do look better and your weight has decreased by 5 pounds.  I will follow the metabolic panel results and notify your heart doctor of your recent significant edema and subsequent improvement.  I want to get his advisement on diuretic instructions going forward.  Your right calf is a little more swollen than the left side.  We are approaching the holiday weekend and I want to make sure you do not have a DVT on the right side.  Placed the ultrasound ordered to be done today.  We will follow the ultrasound results and notify you.  Your rt foot is faintly warm presently.  If the ultrasound is negative and your foot warmth worsens with redness then you can start Keflex antibiotic over the holidays.  I am providing a prescription today.  Follow-up date to be determined.  Also when I send you note regarding your edema to your cardiologist.  I will also inquire if they want to see you sooner than your next scheduled appointment.

## 2017-09-22 NOTE — Progress Notes (Signed)
Subjective:    Patient ID: Darren Mueller, male    DOB: May 15, 1951, 66 y.o.   MRN: [id number]  HPI  Pt in for a follow up. He still has swollen lower ext. Last visit did cxr done along with bnp and cmp.   CXR impression.  Cardiomegaly and cephalization of the pulmonary vasculature, without overt pulmonary edema. No pleural effusions.   BNP- was 190. cmp-showed mild elevated lft. K was in normal range. Cbc showed low wbc and low platelets. Pt had low wbc and low platelets. No new bruising or nose bleeds responded.  No chest pain. Short of breath chronic. Not worse than baseline. Pt weight today was 207 lb. Last time was 211.   Pt thinks rt lower ext faint more swollen than left and foot maybe warm.    Review of Systems  Constitutional: Negative for appetite change, diaphoresis, fatigue and fever.  Respiratory: Positive for shortness of breath. Negative for cough, choking and wheezing.        Patient has history of chronic shortness of breath.  He states that his shortness of breath is at baseline of his normal.  No recent worsening.  Cardiovascular: Negative for chest pain and palpitations.  Gastrointestinal: Negative for abdominal pain, nausea and vomiting.  Genitourinary: Negative for dysuria.  Musculoskeletal: Negative for back pain.       Bilateral pedal edema.  Right side a little bit worse than the left per patient report today.  Skin: Negative for rash.  Neurological: Negative for dizziness and headaches.  Hematological: Negative for adenopathy. Does not bruise/bleed easily.  Psychiatric/Behavioral: Negative for behavioral problems and confusion.   Past Medical History:  Diagnosis Date  . Alcohol dependence (HCC) 03/22/2011   In remission   . Alcohol dependence in remission (HCC) 03/22/2011   In remission   . Alcoholic cirrhosis of liver with ascites (HCC) 2014  . Dermatitis 06/10/2015  . Diarrhea 09/04/2013  . Elbow pain, left 03/17/2017  . GERD (gastroesophageal  reflux disease)   . Gout   . Heart murmur   . Hx of colonic polyps   . Hypertension   . Hypertriglyceridemia 03/17/2017  . Hypopotassemia   . Otalgia 11/28/2013  . Other chronic pulmonary heart diseases   . Preventative health care 06/29/2016  . Red eye 03/03/2016   right  . Renal insufficiency 07/18/2013  . Seizures (HCC)   . Sleep apnea    does not wear CPAP  . Thrombocytopenia (HCC) 06/29/2016  . Unspecified hypothyroidism 01/25/2013  . Unspecified pleural effusion      Social History   Socioeconomic History  . Marital status: Divorced    Spouse name: Not on file  . Number of children: 3  . Years of education: Not on file  . Highest education level: Not on file  Social Needs  . Financial resource strain: Not on file  . Food insecurity - worry: Not on file  . Food insecurity - inability: Not on file  . Transportation needs - medical: Not on file  . Transportation needs - non-medical: Not on file  Occupational History  . Occupation: Retired    Comment: Company secretary  Tobacco Use  . Smoking status: Former Smoker    Packs/day: 2.00    Years: 5.00    Pack years: 10.00    Types: Cigarettes    Last attempt to quit: 09/22/1979    Years since quitting: 38.0  . Smokeless tobacco: Never Used  Substance and Sexual Activity  . Alcohol use:  Yes    Alcohol/week: 3.6 oz    Types: 6 Shots of liquor per week    Comment: 12/31/2016 " "2 vodka & tonics on MWF"  . Drug use: No  . Sexual activity: Not Currently    Birth control/protection: Spermicide  Other Topics Concern  . Not on file  Social History Narrative   0 caffeine drinks daily     Past Surgical History:  Procedure Laterality Date  . COLONOSCOPY N/A 12/08/2013   Performed by Rachael Fee, MD at [facility] ENDOSCOPY  . ESOPHAGOGASTRODUODENOSCOPY (EGD) WITH PROPOFOL N/A 03/27/2016   Performed by Rachael Fee, MD at [facility] ENDOSCOPY  . Loop Recorder Insertion N/A 01/01/2017   Performed by Hillis Range, MD at [facility] INVASIVE CV LAB  .  Right Heart Cath N/A 12/21/2015   Performed by Dolores Patty, MD at [facility] INVASIVE CV LAB  . RIGHT HEART CATH Right 06/06/2014   Performed by Dolores Patty, MD at [facility] CATH LAB  . Right/Left Heart Cath and Coronary Angiography N/A 04/15/2016   Performed by Dolores Patty, MD at [facility] INVASIVE CV LAB  . UVULOPALATOPHARYNGOPLASTY  1999    Family History  Problem Relation Age of Onset  . Heart disease Mother   . Heart attack Mother   . Hypertension Mother   . Prostate cancer Father   . Colon cancer Neg Hx     Allergies  Allergen Reactions  . Aspirin Other (See Comments)    hypertension  . Nitroglycerin     Headache    Current Outpatient Medications on File Prior to Visit  Medication Sig Dispense Refill  . Albuterol Sulfate (PROAIR RESPICLICK) 108 (90 Base) MCG/ACT AEPB Inhale 2 puffs into the lungs every 6 (six) hours as needed (SOB or wheezing).    Marland Kitchen allopurinol (ZYLOPRIM) 300 MG tablet Take 1 tablet (300 mg total) by mouth daily. 30 tablet 3  . colchicine 0.6 MG tablet TAKE 1 TABLET (0.6 MG TOTAL) BY MOUTH 2 (TWO) TIMES DAILY. 20 tablet 1  . doxepin (SINEQUAN) 10 MG capsule Take 1-2 capsules (10-20 mg total) by mouth at bedtime as needed. 60 capsule 5  . Fluticasone Propionate 0.05 % LOTN Apply daily to affected area 3 Bottle 3  . fluticasone-salmeterol (ADVAIR HFA) 230-21 MCG/ACT inhaler Inhale 2 puffs into the lungs every morning.    . furosemide (LASIX) 40 MG tablet Take 40 mg (1 tab) once on Monday and Friday mornings. Only take if weight is 200 pounds or greater. 30 tablet 3  . levothyroxine (SYNTHROID, LEVOTHROID) 25 MCG tablet TAKE 1 TABLET DAILY 90 tablet 1  . loperamide (ANTI-DIARRHEAL) 2 MG capsule Take 4 mg by mouth as needed for diarrhea or loose stools (up to 8 capsules per day).     Marland Kitchen losartan (COZAAR) 50 MG tablet TAKE 1 TABLET DAILY 90 tablet 2  . Macitentan (OPSUMIT) 10 MG TABS Take 10 mg by mouth daily. 90 tablet 3  . Multiple Vitamin (MULTIVITAMIN WITH  MINERALS) TABS tablet Take 1 tablet by mouth daily.    Marland Kitchen omeprazole (PRILOSEC) 40 MG capsule TAKE 1 CAPSULE DAILY 90 capsule 2  . potassium chloride (K-DUR,KLOR-CON) 10 MEQ tablet 2 tablets daily, extra 2 tablets on Monday and Friday only if you take lasix. 20 tablet 6  . rosuvastatin (CRESTOR) 5 MG tablet Take 1 tablet (5 mg total) by mouth daily. 30 tablet 5  . Selexipag (UPTRAVI) 1000 MCG TABS Take 1,000 mcg by mouth every morning. 90 tablet 3  .  Selexipag 1200 MCG TABS Take 1,200 mcg by mouth every evening. 90 tablet 3  . spironolactone (ALDACTONE) 50 MG tablet TAKE 1 TABLET DAILY 90 tablet 1  . SYMAX-SR 0.375 MG 12 hr tablet TAKE 1 TABLET TWICE A DAY 180 tablet 1  . tadalafil, PAH, (ADCIRCA) 20 MG tablet Take 2 tablets (40 mg total) by mouth daily. 60 tablet 11   No current facility-administered medications on file prior to visit.     BP 112/68   Pulse 84   Temp (!) 97.5 F (36.4 C) (Oral)   Resp 16   Wt 207 lb 9.6 oz (94.2 kg)   SpO2 97%   BMI 28.16 kg/m       Objective:   Physical Exam  General Mental Status- Alert. General Appearance- Not in acute distress.   Skin General: Color- Normal Color. Moisture- Normal Moisture.  Neck Carotid Arteries- Normal color. Moisture- Normal Moisture. No carotid bruits. No JVD.  Chest and Lung Exam Auscultation: Breath Sounds:-Normal.  Cardiovascular Auscultation:Rythm- Regular. Murmurs & Other Heart Sounds:Auscultation of the heart reveals- No Murmurs.  Abdomen Inspection:-Inspeection Normal. Palpation/Percussion:Note:No mass. Palpation and Percussion of the abdomen reveal- Non Tender, Non Distended + BS, no rebound or guarding.    Neurologic Cranial Nerve exam:- CN III-XII intact(No nystagmus), symmetric smile. Strength:- 5/5 equal and symmetric strength both upper and lower extremities.  Lower extremities-bilateral  Lower 1/3 pretibial 1+ edema.    Right side calf appears slightly more swollen than the left  today.  Top of the right foot feels faintly warm as well. Negative Homans sign.     Pretibial swelling appears much improved.      Assessment & Plan:  For history of CHF, will get a CMP to check potassium level in light of recent dose adjustment on your Lasix.  Overall your legs do look better and your weight has decreased by 5 pounds.  I will follow the metabolic panel results and notify your heart doctor of your recent significant edema.  I want to get his advisement on diuretic instructions going forward.  Your right calf is a little more swollen than the left side.  We are approaching the holiday weekend and I want to make sure you do not have a DVT on the right side.  Placed the ultrasound ordered to be done today.  We will follow the ultrasound results and notify you.  Your rt foot is faintly warm presently.  If the ultrasound is negative and your foot warmth worsens with redness then you can start Keflex antibiotic over the holidays.  I am providing a prescription today.  Follow-up date to be determined.  Also when I send you note regarding your edema to your cardiologist.  I will also inquire if they want to see you sooner than your next scheduled appointment.  Jameson Tormey, Ramon Dredge, PA-C

## 2017-09-23 ENCOUNTER — Telehealth: Payer: Self-pay | Admitting: Medical

## 2017-09-23 DIAGNOSIS — I509 Heart failure, unspecified: Secondary | ICD-10-CM

## 2017-09-23 NOTE — Telephone Encounter (Signed)
Referral placed.

## 2017-09-28 ENCOUNTER — Ambulatory Visit (INDEPENDENT_AMBULATORY_CARE_PROVIDER_SITE_OTHER): Payer: Medicare Other | Admitting: *Deleted

## 2017-09-28 DIAGNOSIS — R55 Syncope and collapse: Secondary | ICD-10-CM | POA: Diagnosis not present

## 2017-09-29 NOTE — Progress Notes (Signed)
Carelink Summary Report / Loop Recorder 

## 2017-10-01 ENCOUNTER — Ambulatory Visit (INDEPENDENT_AMBULATORY_CARE_PROVIDER_SITE_OTHER): Payer: Medicare Other | Admitting: Family Medicine

## 2017-10-01 ENCOUNTER — Encounter: Payer: Self-pay | Admitting: Family Medicine

## 2017-10-01 ENCOUNTER — Telehealth: Payer: Self-pay

## 2017-10-01 VITALS — BP 120/64 | HR 87 | Temp 97.9°F | Resp 16 | Wt 210.0 lb

## 2017-10-01 DIAGNOSIS — D696 Thrombocytopenia, unspecified: Secondary | ICD-10-CM

## 2017-10-01 DIAGNOSIS — I10 Essential (primary) hypertension: Secondary | ICD-10-CM | POA: Diagnosis not present

## 2017-10-01 DIAGNOSIS — N183 Chronic kidney disease, stage 3 unspecified: Secondary | ICD-10-CM

## 2017-10-01 DIAGNOSIS — R600 Localized edema: Secondary | ICD-10-CM | POA: Diagnosis not present

## 2017-10-01 DIAGNOSIS — K7030 Alcoholic cirrhosis of liver without ascites: Secondary | ICD-10-CM | POA: Diagnosis not present

## 2017-10-01 DIAGNOSIS — E782 Mixed hyperlipidemia: Secondary | ICD-10-CM

## 2017-10-01 DIAGNOSIS — F1029 Alcohol dependence with unspecified alcohol-induced disorder: Secondary | ICD-10-CM | POA: Diagnosis not present

## 2017-10-01 DIAGNOSIS — M1A9XX0 Chronic gout, unspecified, without tophus (tophi): Secondary | ICD-10-CM

## 2017-10-01 DIAGNOSIS — E038 Other specified hypothyroidism: Secondary | ICD-10-CM

## 2017-10-01 LAB — CBC WITH DIFFERENTIAL/PLATELET
BASOS ABS: 0 10*3/uL (ref 0.0–0.1)
Basophils Relative: 0.4 % (ref 0.0–3.0)
Eosinophils Absolute: 0 10*3/uL (ref 0.0–0.7)
Eosinophils Relative: 1.5 % (ref 0.0–5.0)
HCT: 35 % — ABNORMAL LOW (ref 39.0–52.0)
HEMOGLOBIN: 11.5 g/dL — AB (ref 13.0–17.0)
LYMPHS ABS: 0.5 10*3/uL — AB (ref 0.7–4.0)
Lymphocytes Relative: 30.2 % (ref 12.0–46.0)
MCHC: 32.9 g/dL (ref 30.0–36.0)
MCV: 107.2 fl — ABNORMAL HIGH (ref 78.0–100.0)
MONO ABS: 0.2 10*3/uL (ref 0.1–1.0)
MONOS PCT: 15.2 % — AB (ref 3.0–12.0)
NEUTROS PCT: 52.7 % (ref 43.0–77.0)
Neutro Abs: 0.8 10*3/uL — ABNORMAL LOW (ref 1.4–7.7)
Platelets: 41 10*3/uL — CL (ref 150.0–400.0)
RBC: 3.27 Mil/uL — AB (ref 4.22–5.81)
RDW: 14.7 % (ref 11.5–15.5)

## 2017-10-01 LAB — COMPREHENSIVE METABOLIC PANEL
ALBUMIN: 3.9 g/dL (ref 3.5–5.2)
ALK PHOS: 220 U/L — AB (ref 39–117)
ALT: 27 U/L (ref 0–53)
AST: 72 U/L — ABNORMAL HIGH (ref 0–37)
BILIRUBIN TOTAL: 1.1 mg/dL (ref 0.2–1.2)
BUN: 33 mg/dL — AB (ref 6–23)
CO2: 22 mEq/L (ref 19–32)
CREATININE: 1.63 mg/dL — AB (ref 0.40–1.50)
Calcium: 9.1 mg/dL (ref 8.4–10.5)
Chloride: 109 mEq/L (ref 96–112)
GFR: 54.43 mL/min — ABNORMAL LOW (ref >60.00)
GLUCOSE: 101 mg/dL — AB (ref 70–99)
POTASSIUM: 3.9 meq/L (ref 3.5–5.1)
SODIUM: 138 meq/L (ref 135–145)
TOTAL PROTEIN: 7.4 g/dL (ref 6.0–8.3)

## 2017-10-01 LAB — LIPID PANEL
CHOL/HDL RATIO: 3
Cholesterol: 166 mg/dL (ref 0–200)
HDL: 62.5 mg/dL (ref >39.00)
Triglycerides: 420 mg/dL — ABNORMAL HIGH (ref 0.0–149.0)

## 2017-10-01 LAB — URIC ACID: URIC ACID, SERUM: 2.6 mg/dL — AB (ref 4.0–7.8)

## 2017-10-01 LAB — TSH: TSH: 3.97 u[IU]/mL (ref 0.35–4.50)

## 2017-10-01 LAB — LDL CHOLESTEROL, DIRECT: LDL DIRECT: 40 mg/dL

## 2017-10-01 NOTE — Assessment & Plan Note (Signed)
Well controlled, no changes to meds. Encouraged heart healthy diet such as the DASH diet and exercise as tolerated.

## 2017-10-01 NOTE — Assessment & Plan Note (Signed)
Check CBC

## 2017-10-01 NOTE — Progress Notes (Signed)
Patient ID: Darren Mueller, male   DOB: [birth year], 66 y.o.   MRN: [id number]   Subjective:    Patient ID: Darren Mueller, male    DOB: 09/20/1951, 66 y.o.   MRN: [id number]  No chief complaint on file.   HPI Patient is in today for follow up. He has struggled with increased pedal edema for several weeks now. When he was taking the Lasix daily it was improved but not gone. Now it is worsening again. No recent trauma or change in meds. He otherwise feels well. Has not been elevating his feet or wearing compression hose. Denies CP/palp/SOB/HA/congestion/fevers/GI or GU c/o. Taking meds as prescribed  Past Medical History:  Diagnosis Date  . Alcohol dependence ([place]) 03/22/2011   In remission   . Alcohol dependence in remission ([place]) 03/22/2011   In remission   . Alcoholic cirrhosis of liver with ascites ([place]) 2014  . Dermatitis 06/10/2015  . Diarrhea 09/04/2013  . Elbow pain, left 03/17/2017  . GERD (gastroesophageal reflux disease)   . Gout   . Heart murmur   . Hx of colonic polyps   . Hypertension   . Hypertriglyceridemia 03/17/2017  . Hypopotassemia   . Otalgia 11/28/2013  . Other chronic pulmonary heart diseases   . Preventative health care 06/29/2016  . Red eye 03/03/2016   right  . Renal insufficiency 07/18/2013  . Seizures ([place])   . Sleep apnea    does not wear CPAP  . Thrombocytopenia ([place]) 06/29/2016  . Unspecified hypothyroidism 01/25/2013  . Unspecified pleural effusion     Past Surgical History:  Procedure Laterality Date  . CARDIAC CATHETERIZATION N/A 12/21/2015   Procedure: Right Heart Cath;  Surgeon: Jolaine Artist, MD;  Location: [facility];  Service: Cardiovascular;  Laterality: N/A;  . CARDIAC CATHETERIZATION N/A 04/15/2016   Procedure: Right/Left Heart Cath and Coronary Angiography;  Surgeon: Jolaine Artist, MD;  Location: [facility];  Service: Cardiovascular;  Laterality: N/A;  . COLONOSCOPY N/A 12/08/2013   Procedure: COLONOSCOPY;  Surgeon:  Milus Banister, MD;  Location: [facility];  Service: Endoscopy;  Laterality: N/A;  . ESOPHAGOGASTRODUODENOSCOPY (EGD) WITH PROPOFOL N/A 03/27/2016   Procedure: ESOPHAGOGASTRODUODENOSCOPY (EGD) WITH PROPOFOL;  Surgeon: Milus Banister, MD;  Location: [facility];  Service: Endoscopy;  Laterality: N/A;  . LOOP RECORDER INSERTION N/A 01/01/2017   Procedure: Loop Recorder Insertion;  Surgeon: Thompson Grayer, MD;  Location: [facility];  Service: Cardiovascular;  Laterality: N/A;  . RIGHT HEART CATHETERIZATION Right 06/06/2014   Procedure: RIGHT HEART CATH;  Surgeon: Jolaine Artist, MD;  Location: [facility];  Service: Cardiovascular;  Laterality: Right;  . UVULOPALATOPHARYNGOPLASTY  1999    Family History  Problem Relation Age of Onset  . Heart disease Mother   . Heart attack Mother   . Hypertension Mother   . Prostate cancer Father   . Colon cancer Neg Hx     Social History   Socioeconomic History  . Marital status: Divorced    Spouse name: Not on file  . Number of children: 3  . Years of education: Not on file  . Highest education level: Not on file  Social Needs  . Financial resource strain: Not on file  . Food insecurity - worry: Not on file  . Food insecurity - inability: Not on file  . Transportation needs - medical: Not on file  . Transportation needs - non-medical: Not on file  Occupational History  . Occupation: Retired  Comment: Social research officer, government  Tobacco Use  . Smoking status: Former Smoker    Packs/day: 2.00    Years: 5.00    Pack years: 10.00    Types: Cigarettes    Last attempt to quit: 09/22/1979    Years since quitting: 38.0  . Smokeless tobacco: Never Used  Substance and Sexual Activity  . Alcohol use: Yes    Alcohol/week: 3.6 oz    Types: 6 Shots of liquor per week    Comment: 12/31/2016 " "2 vodka & tonics on MWF"  . Drug use: No  . Sexual activity: Not Currently    Birth control/protection: Spermicide  Other Topics Concern  . Not on file    Social History Narrative   0 caffeine drinks daily     Outpatient Medications Prior to Visit  Medication Sig Dispense Refill  . Albuterol Sulfate (PROAIR RESPICLICK) 341 (90 Base) MCG/ACT AEPB Inhale 2 puffs into the lungs every 6 (six) hours as needed (SOB or wheezing).    Marland Kitchen allopurinol (ZYLOPRIM) 300 MG tablet Take 1 tablet (300 mg total) by mouth daily. 30 tablet 3  . colchicine 0.6 MG tablet TAKE 1 TABLET (0.6 MG TOTAL) BY MOUTH 2 (TWO) TIMES DAILY. 20 tablet 1  . doxepin (SINEQUAN) 10 MG capsule Take 1-2 capsules (10-20 mg total) by mouth at bedtime as needed. 60 capsule 5  . Fluticasone Propionate 0.05 % LOTN Apply daily to affected area 3 Bottle 3  . fluticasone-salmeterol (ADVAIR HFA) 230-21 MCG/ACT inhaler Inhale 2 puffs into the lungs every morning.    . furosemide (LASIX) 40 MG tablet Take 40 mg (1 tab) once on Monday and Friday mornings. Only take if weight is 200 pounds or greater. 30 tablet 3  . levothyroxine (SYNTHROID, LEVOTHROID) 25 MCG tablet TAKE 1 TABLET DAILY 90 tablet 1  . loperamide (ANTI-DIARRHEAL) 2 MG capsule Take 4 mg by mouth as needed for diarrhea or loose stools (up to 8 capsules per day).     Marland Kitchen losartan (COZAAR) 50 MG tablet TAKE 1 TABLET DAILY 90 tablet 2  . Macitentan (OPSUMIT) 10 MG TABS Take 10 mg by mouth daily. 90 tablet 3  . Multiple Vitamin (MULTIVITAMIN WITH MINERALS) TABS tablet Take 1 tablet by mouth daily.    Marland Kitchen omeprazole (PRILOSEC) 40 MG capsule TAKE 1 CAPSULE DAILY 90 capsule 2  . potassium chloride (K-DUR,KLOR-CON) 10 MEQ tablet 2 tablets daily, extra 2 tablets on Monday and Friday only if you take lasix. 20 tablet 6  . rosuvastatin (CRESTOR) 5 MG tablet Take 1 tablet (5 mg total) by mouth daily. 30 tablet 5  . Selexipag (UPTRAVI) 1000 MCG TABS Take 1,000 mcg by mouth every morning. 90 tablet 3  . Selexipag 1200 MCG TABS Take 1,200 mcg by mouth every evening. 90 tablet 3  . spironolactone (ALDACTONE) 50 MG tablet TAKE 1 TABLET DAILY 90 tablet 1   . SYMAX-SR 0.375 MG 12 hr tablet TAKE 1 TABLET TWICE A DAY 180 tablet 1  . tadalafil, PAH, (ADCIRCA) 20 MG tablet Take 2 tablets (40 mg total) by mouth daily. 60 tablet 11  . cephALEXin (KEFLEX) 500 MG capsule Take 1 capsule (500 mg total) by mouth 3 (three) times daily. 21 capsule 0   No facility-administered medications prior to visit.     Allergies  Allergen Reactions  . Aspirin Other (See Comments)    hypertension  . Nitroglycerin     Headache    Review of Systems  Constitutional: Positive for malaise/fatigue. Negative for  fever.  HENT: Negative for congestion.   Eyes: Negative for blurred vision.  Respiratory: Negative for shortness of breath.   Cardiovascular: Positive for leg swelling. Negative for chest pain and palpitations.  Gastrointestinal: Negative for abdominal pain, blood in stool and nausea.  Genitourinary: Negative for dysuria and frequency.  Musculoskeletal: Negative for falls.  Skin: Negative for rash.  Neurological: Negative for dizziness, loss of consciousness and headaches.  Endo/Heme/Allergies: Negative for environmental allergies.  Psychiatric/Behavioral: Negative for depression. The patient is nervous/anxious.        Objective:    Physical Exam  Constitutional: He is oriented to person, place, and time. He appears well-developed and well-nourished. No distress.  HENT:  Head: Normocephalic and atraumatic.  Nose: Nose normal.  Eyes: Right eye exhibits no discharge. Left eye exhibits no discharge.  Neck: Normal range of motion. Neck supple.  Cardiovascular: Normal rate and regular rhythm.  No murmur heard. Pulmonary/Chest: Effort normal and breath sounds normal.  Abdominal: Soft. Bowel sounds are normal. There is no tenderness.  Musculoskeletal: He exhibits edema.  2+ pedal edema b/l  Neurological: He is alert and oriented to person, place, and time.  Skin: Skin is warm and dry.  Psychiatric: He has a normal mood and affect.  Nursing note and  vitals reviewed.   BP 120/64 (BP Location: Left Arm, Patient Position: Sitting, Cuff Size: Normal)   Pulse 87   Temp 97.9 F (36.6 C) (Oral)   Resp 16   Wt 210 lb (95.3 kg)   SpO2 97%   BMI 28.48 kg/m  Wt Readings from Last 3 Encounters:  10/01/17 210 lb (95.3 kg)  09/22/17 207 lb 9.6 oz (94.2 kg)  09/17/17 211 lb 3.2 oz (95.8 kg)     Lab Results  Component Value Date   WBC 1.6 Repeated and verified X2. (LL) 09/17/2017   HGB 11.7 (L) 09/17/2017   HCT 35.8 (L) 09/17/2017   PLT 44.0 Repeated and verified X2. (LL) 09/17/2017   GLUCOSE 97 09/22/2017   CHOL 169 06/22/2017   TRIG 282.0 (H) 06/22/2017   HDL 64.50 06/22/2017   LDLDIRECT 70.0 06/22/2017   LDLCALC 77 12/04/2015   ALT 38 09/22/2017   AST 116 (H) 09/22/2017   NA 134 (L) 09/22/2017   K 4.3 09/22/2017   CL 104 09/22/2017   CREATININE 2.22 (H) 09/22/2017   BUN 48 (H) 09/22/2017   CO2 22 09/22/2017   TSH 2.84 03/17/2017   PSA 0.49 11/01/2012   INR 1.25 06/04/2016   HGBA1C 5.4 03/02/2015    Lab Results  Component Value Date   TSH 2.84 03/17/2017   Lab Results  Component Value Date   WBC 1.6 Repeated and verified X2. (LL) 09/17/2017   HGB 11.7 (L) 09/17/2017   HCT 35.8 (L) 09/17/2017   MCV 107.4 (H) 09/17/2017   PLT 44.0 Repeated and verified X2. (LL) 09/17/2017   Lab Results  Component Value Date   NA 134 (L) 09/22/2017   K 4.3 09/22/2017   CO2 22 09/22/2017   GLUCOSE 97 09/22/2017   BUN 48 (H) 09/22/2017   CREATININE 2.22 (H) 09/22/2017   BILITOT 1.2 09/22/2017   ALKPHOS 260 (H) 09/22/2017   AST 116 (H) 09/22/2017   ALT 38 09/22/2017   PROT 7.0 09/22/2017   ALBUMIN 3.8 09/22/2017   CALCIUM 8.8 09/22/2017   ANIONGAP 7 05/19/2017   GFR 38.11 (L) 09/22/2017   Lab Results  Component Value Date   CHOL 169 06/22/2017   Lab Results  Component Value Date   HDL 64.50 06/22/2017   Lab Results  Component Value Date   LDLCALC 77 12/04/2015   Lab Results  Component Value Date   TRIG 282.0  (H) 06/22/2017   Lab Results  Component Value Date   CHOLHDL 3 06/22/2017   Lab Results  Component Value Date   HGBA1C 5.4 03/02/2015       Assessment & Plan:   Problem List Items Addressed This Visit    Hypertension (Chronic)    Well controlled, no changes to meds. Encouraged heart healthy diet such as the DASH diet and exercise as tolerated.       Relevant Orders   CBC with Differential/Platelet   Comprehensive metabolic panel   TSH   Chronic renal insufficiency, stage III (moderate) (HCC) (Chronic)    Repeat cmp today consider referral to nephrology      Cirrhosis with alcoholism ([place]) (Chronic)    Once again encouraged complete cessation      Alcohol dependence ([place])    Has cut down but continues to drink, encouraged complete cessation      Hypothyroidism    On Levothyroxine, continue to monitor      Relevant Orders   TSH   Gout    Check Uric acid level today      Relevant Orders   Uric acid   Thrombocytopenia (HCC)    Check CBC      Pedal edema    Encouraged to quit alcohol, mountain dew and to take Lasix daily x 3 days then as needed. Weigh yourself daily and take extra lasix with >3# weight gain, minimize sodium elevate feet, compression hose.        Other Visit Diagnoses    Mixed hyperlipidemia    -  Primary   Relevant Orders   Lipid panel      I have discontinued Jerrol Tuman's cephALEXin. I am also having him maintain his loperamide, Macitentan, Fluticasone Propionate, levothyroxine, SYMAX-SR, Albuterol Sulfate, fluticasone-salmeterol, spironolactone, multivitamin with minerals, doxepin, furosemide, potassium chloride, losartan, rosuvastatin, tadalafil (PAH), omeprazole, allopurinol, Selexipag, Selexipag, and colchicine.  No orders of the defined types were placed in this encounter.   CMA served as Education administrator during this visit. History, Physical and Plan performed by medical provider. Documentation and orders reviewed and attested to.    Penni Homans, MD

## 2017-10-01 NOTE — Telephone Encounter (Signed)
Spoke w/ Santiago Glad at [facility]- critical labs  WBC- 1.6  Platelet- 41

## 2017-10-01 NOTE — Assessment & Plan Note (Signed)
Check Uric acid level today

## 2017-10-01 NOTE — Assessment & Plan Note (Signed)
Once again encouraged complete cessation

## 2017-10-01 NOTE — Patient Instructions (Addendum)
Weigh yourself daily, if weight gain>3# in 24 hours then take an extra Lasix Otherwise take Lasix M, W, F Stop mountain dew, alcohol, elevate feet above heart for 15 minutes 3 x daily Compression knee high light weight  Shingrix is the new shingles shot, 2 shots over 2-6 month window, at pharmacy Edema Edema is when you have too much fluid in your body or under your skin. Edema may make your legs, feet, and ankles swell up. Swelling is also common in looser tissues, like around your eyes. This is a common condition. It gets more common as you get older. There are many possible causes of edema. Eating too much salt (sodium) and being on your feet or sitting for a long time can cause edema in your legs, feet, and ankles. Hot weather may make edema worse. Edema is usually painless. Your skin may look swollen or shiny. Follow these instructions at home:  Keep the swollen body part raised (elevated) above the level of your heart when you are sitting or lying down.  Do not sit still or stand for a long time.  Do not wear tight clothes. Do not wear garters on your upper legs.  Exercise your legs. This can help the swelling go down.  Wear elastic bandages or support stockings as told by your doctor.  Eat a low-salt (low-sodium) diet to reduce fluid as told by your doctor.  Depending on the cause of your swelling, you may need to limit how much fluid you drink (fluid restriction).  Take over-the-counter and prescription medicines only as told by your doctor. Contact a doctor if:  Treatment is not working.  You have heart, liver, or kidney disease and have symptoms of edema.  You have sudden and unexplained weight gain. Get help right away if:  You have shortness of breath or chest pain.  You cannot breathe when you lie down.  You have pain, redness, or warmth in the swollen areas.  You have heart, liver, or kidney disease and get edema all of a sudden.  You have a fever and your  symptoms get worse all of a sudden. Summary  Edema is when you have too much fluid in your body or under your skin.  Edema may make your legs, feet, and ankles swell up. Swelling is also common in looser tissues, like around your eyes.  Raise (elevate) the swollen body part above the level of your heart when you are sitting or lying down.  Follow your doctor's instructions about diet and how much fluid you can drink (fluid restriction). This information is not intended to replace advice given to you by your health care provider. Make sure you discuss any questions you have with your health care provider. Document Released: 04/07/2008 Document Revised: 11/07/2016 Document Reviewed: 11/07/2016 Elsevier Interactive Patient Education  2017 [employer].

## 2017-10-01 NOTE — Assessment & Plan Note (Signed)
Encouraged to quit alcohol, mountain dew and to take Lasix daily x 3 days then as needed. Weigh yourself daily and take extra lasix with >3# weight gain, minimize sodium elevate feet, compression hose.

## 2017-10-01 NOTE — Assessment & Plan Note (Signed)
Has cut down but continues to drink, encouraged complete cessation

## 2017-10-01 NOTE — Assessment & Plan Note (Signed)
Repeat cmp today consider referral to nephrology

## 2017-10-01 NOTE — Assessment & Plan Note (Signed)
Following with Dr Sung Amabile

## 2017-10-01 NOTE — Assessment & Plan Note (Signed)
On Levothyroxine, continue to monitor 

## 2017-10-02 ENCOUNTER — Telehealth: Payer: Self-pay | Admitting: Family Medicine

## 2017-10-02 MED ORDER — ALLOPURINOL 100 MG PO TABS: 100.0000 mg | ORAL_TABLET | Freq: Every day | ORAL | 6 refills | Status: DC

## 2017-10-02 MED FILL — ALLOPURINOL 100 MG TABS: 100 | 90 days supply | Qty: 180 | Fill #0

## 2017-10-02 MED FILL — COLCHICINE 0.6 MG TABS: 0.6 | 10 days supply | Qty: 20 | Fill #1

## 2017-10-02 MED FILL — DOXEPIN 10 MG CAPSULE: 10 | 30 days supply | Qty: 60 | Fill #2

## 2017-10-02 NOTE — Telephone Encounter (Signed)
Copied from [place] [id number]. Topic: Inquiry >> Oct 02, 2017 11:58 AM Oliver Pila B wrote: Reason for CRM: pt is needing his Rx of allopurinal called in for him downstairs so he can pick it up

## 2017-10-02 NOTE — Addendum Note (Signed)
Addended by: Magdalene Molly A on: 10/02/2017 03:29 PM   Modules accepted: Orders

## 2017-10-02 NOTE — Telephone Encounter (Signed)
Refill request   Recent  Critical  Lab  Results

## 2017-10-05 ENCOUNTER — Other Ambulatory Visit: Payer: Self-pay | Admitting: Family Medicine

## 2017-10-06 NOTE — Telephone Encounter (Signed)
Chronic per PCP.

## 2017-10-06 NOTE — Telephone Encounter (Signed)
Refill sent  

## 2017-10-07 ENCOUNTER — Telehealth (HOSPITAL_COMMUNITY): Payer: Self-pay

## 2017-10-07 ENCOUNTER — Encounter (HOSPITAL_COMMUNITY): Payer: Medicare Other

## 2017-10-07 NOTE — Telephone Encounter (Signed)
[facility] appointment reminder call placed to patient for upcoming post-hospital follow up.  LV[facility] to confirm apt.  Patient also reminded to take all medications as prescribed on the day of his/her appointment and to bring all medications to this appointment.  Advised to call our office for tardiness or cancellations/rescheduling needs.  Leory Plowman, Guinevere Ferrari

## 2017-10-08 ENCOUNTER — Encounter (HOSPITAL_COMMUNITY): Payer: Self-pay

## 2017-10-08 ENCOUNTER — Ambulatory Visit (HOSPITAL_COMMUNITY)
Admission: RE | Admit: 2017-10-08 | Discharge: 2017-10-08 | Disposition: A | Discharge status: Home / Self Care | Payer: Medicare Other | Source: Ambulatory Visit | Attending: Internal Medicine | Admitting: Internal Medicine

## 2017-10-08 VITALS — BP 120/78 | HR 83 | Wt 210.0 lb

## 2017-10-08 DIAGNOSIS — I129 Hypertensive chronic kidney disease with stage 1 through stage 4 chronic kidney disease, or unspecified chronic kidney disease: Secondary | ICD-10-CM | POA: Diagnosis not present

## 2017-10-08 DIAGNOSIS — I2721 Secondary pulmonary arterial hypertension: Secondary | ICD-10-CM | POA: Diagnosis not present

## 2017-10-08 DIAGNOSIS — K7030 Alcoholic cirrhosis of liver without ascites: Secondary | ICD-10-CM | POA: Insufficient documentation

## 2017-10-08 DIAGNOSIS — K7031 Alcoholic cirrhosis of liver with ascites: Secondary | ICD-10-CM

## 2017-10-08 DIAGNOSIS — J449 Chronic obstructive pulmonary disease, unspecified: Secondary | ICD-10-CM | POA: Diagnosis not present

## 2017-10-08 DIAGNOSIS — N183 Chronic kidney disease, stage 3 unspecified: Secondary | ICD-10-CM

## 2017-10-08 DIAGNOSIS — I50810 Right heart failure, unspecified: Secondary | ICD-10-CM

## 2017-10-08 DIAGNOSIS — I272 Pulmonary hypertension, unspecified: Secondary | ICD-10-CM | POA: Insufficient documentation

## 2017-10-08 DIAGNOSIS — I5033 Acute on chronic diastolic (congestive) heart failure: Secondary | ICD-10-CM

## 2017-10-08 DIAGNOSIS — I2729 Other secondary pulmonary hypertension: Secondary | ICD-10-CM | POA: Diagnosis not present

## 2017-10-08 DIAGNOSIS — K766 Portal hypertension: Secondary | ICD-10-CM | POA: Diagnosis not present

## 2017-10-08 DIAGNOSIS — I2510 Atherosclerotic heart disease of native coronary artery without angina pectoris: Secondary | ICD-10-CM | POA: Insufficient documentation

## 2017-10-08 DIAGNOSIS — Z79899 Other long term (current) drug therapy: Secondary | ICD-10-CM | POA: Insufficient documentation

## 2017-10-08 DIAGNOSIS — R55 Syncope and collapse: Secondary | ICD-10-CM | POA: Diagnosis not present

## 2017-10-08 DIAGNOSIS — D61818 Other pancytopenia: Secondary | ICD-10-CM | POA: Insufficient documentation

## 2017-10-08 DIAGNOSIS — G4733 Obstructive sleep apnea (adult) (pediatric): Secondary | ICD-10-CM | POA: Diagnosis not present

## 2017-10-08 MED ORDER — SELEXIPAG 1000 MCG PO TABS: 1000.0000 ug | ORAL_TABLET | Freq: Two times a day (BID) | ORAL | 3 refills | Status: DC

## 2017-10-08 MED ORDER — FUROSEMIDE 10 MG/ML IJ SOLN
40.0000 mg | Freq: Once | INTRAMUSCULAR | Status: AC
  Administered 2017-10-08: 40 mg via INTRAVENOUS
  Filled 2017-10-08: qty 4

## 2017-10-08 MED ORDER — POTASSIUM CHLORIDE CRYS ER 20 MEQ PO TBCR
20.0000 meq | EXTENDED_RELEASE_TABLET | Freq: Once | ORAL | Status: AC
  Administered 2017-10-08: 20 meq via ORAL
  Filled 2017-10-08: qty 1

## 2017-10-08 MED FILL — TADALAFIL (PAH) 20 MG TABS: 20 | 30 days supply | Qty: 60 | Fill #5

## 2017-10-08 NOTE — Progress Notes (Signed)
22 gauge IV inserted by Kevan Rosebush, RN.  40 IV lasix given along with 20 mEq of potassium.  Patient voided 225 ml's.  Patient discharged home per Darrick Grinder, NP.  IV Removed, site clean/dry/intact.

## 2017-10-08 NOTE — Patient Instructions (Addendum)
Decrease Uptravi to 1,000 mcg Twice Daily  Follow up tomorrow at 8:30 am

## 2017-10-08 NOTE — Progress Notes (Signed)
Advanced Heart Failure Medication Review by a Pharmacist  Does the patient  feel that his/her medications are working for him/her?  yes  Has the patient been experiencing any side effects to the medications prescribed?  no  Does the patient measure his/her own blood pressure or blood glucose at home?  no   Does the patient have any problems obtaining medications due to transportation or finances?   no  Understanding of regimen: good Understanding of indications: good Potential of compliance: good Patient understands to avoid NSAIDs. Patient understands to avoid decongestants.  Issues to address at subsequent visits: None   Pharmacist comments: Mr. Stowers is a pleasant 66 yo M presenting with his medication bottles. He reports good compliance with his regimen. He did state that since increasing his Uptravi to 1000/1200 from 1000 mcg BID, he has had more diarrhea, flushing and headaches. He did not have any other medication-related questions or concerns for me at this time.   [name]. [name], PharmD, BCPS, CPP Clinical Pharmacist Pager: [phone] Phone: [phone] 10/08/2017 2:01 PM      Time with patient: 10 minutes Preparation and documentation time: 2 minutes Total time: 12 minutes

## 2017-10-08 NOTE — Progress Notes (Signed)
ADVANCED HF CLINIC NOTE  Patient ID: Jediah Horger, male   DOB: [birth year], 66 y.o.   MRN: [id number] GI : Dr Ardis Hughs PCP: Dr Charlett Blake  Subjective:    Mr. Waln ("[name]") is a 66 yo with history of COPD, portopulmonary hypertension with RV failure, and ETOH cirrhosis.  Admitted in 6/17 with CP and SOB. Was diuresed. Troponins normal. Underwent R/L cath. Minimal CAD with moderate PAH and preserved cardiac output.   Admitted 06/2016 and 07/2016 with syncope.  On September admission found to have elevated ETOH level as well as R 6th rib fracture.   Wore 30 day monitory up until 08/28/16. No AF noted. No events.  9% tachycardia.   Admitted the end of February after syncopal event. LINQ placed.   Admitted 7/4 through 05/08/17 with volume depletion. Diuretics held and changed to as needed for weight 200 pounds or greater. Discharge weight was 194 pounds.   Today he returns for HF follow up. Last visit selexapeg was increased to 1200 at night. Having more headaches and diarrhea. He has been taking lasix 3 times a week for the last week. Says weigh at home went up to 214 pounds. SOB with exertion. + orthopnea. Sleeping upright on the sofa. Denies syncope/presyncope. Drinking alcohol 3 times a week. Having increased leg edema. Unable to wear his shoes due to edema. Taking all medications.   PAH meds 1) Macitnentan 10 mg daily 2) Adcirca 40 mg daily  3) Selexipeg  1000/1200 daily  Studies:  ECHO 12/14 EF 55-60% Peak PA pressure 35. Severe RV dysfunction  ECHO 7/15 EF 60% RV moderately to severely dilated. Moderate HK RVSP 62m HG  ECHO 6/16 EF 60% RV moderately to severely dilated. Severe HK RVSP ~65 mm HG. D-shaped septum Echo 2/17 LVEF 60-65% RV massively dilated. Flat septum. Severe HK. Moderate TR RVSP ~65. IVC small. No effusion  ECHO 01/01/2017 EF 50-55% Grade I DD. RV severely dilated. Peak PA pressure 62 mm hg  Cath 6/17 Mid RCA lesion, 20% stenosed. Dist LAD lesion, 20%  stenosed. Ao = 107/70 (87) LV = 106/3/11 RA = 4 RV = 74/11 PA = 74/26 (45) PCW = 6 Fick cardiac output/index = 4.6.2.2 PVR = 8.5 Ao sat = 95% PA sat = 61%, 58%  RHC 2/17 RA = 11 RV = 80/16/19 PA = 83/61 (71) PCW = 9 Fick cardiac output/index = 5.6/2.6 PVR = 10.3 WU Ao sat = 94% PA sat = 65%, 68% High SVC sat = 65%  RHC 2/14: RA = 13  RV = 63/5/14  PA = 65/24 (41)  PCW = 6  Hepatic wedge = 21  Fick cardiac output/index = 3.8/1.8  PVR = 9.1  FA sat = 94%  PA sat = 58%, 56%  SVC = 54%  RHC 06/06/14 RA = 5  RV = 73/5/9  PA = 81/25 (46)  PCW = 7  Fick cardiac output/index = 6.0/2.8  PVR = 6.5 Woods  FA sat = 95%  PA sat = 71%, 67%  Unable to get hepatic wedge  PFTs 3/14 FEV1 2.02 L (58%) FVC 2.7L (54%) FEV1/FVC (89%) DLCO 53%  PFTs 1/17 FEV1 2.05 L (61%) FVC 2.48L (56%) DLCO 44%  Event Monitor - NSR 10/26/13.   Ab u/s 8/16: + cirrhosis/mild to moderate splenomegaly  6 min walk 01/10/13, 1290 feet  6MW (01/10/14) = 1280 feet (380 m) 6MW (9/15) = 1350 feet (411 m) O2 sats ranged from 89-95% on room air, HR ranged from 100-129.  6MW (6/16) = 384 meters  6MW (2/17) = 1250 feet (32m 6MW (8/17) = 1320 feet      Current Outpatient Medications on File Prior to Encounter  Medication Sig Dispense Refill  . Albuterol Sulfate (PROAIR RESPICLICK) 1147(90 Base) MCG/ACT AEPB Inhale 2 puffs into the lungs every 6 (six) hours as needed (SOB or wheezing).    .Marland Kitchenallopurinol (ZYLOPRIM) 100 MG tablet Take 200 mg by mouth daily.    . colchicine 0.6 MG tablet TAKE 1 TABLET (0.6 MG TOTAL) BY MOUTH 2 (TWO) TIMES DAILY. 20 tablet 1  . doxepin (SINEQUAN) 10 MG capsule Take 1-2 capsules (10-20 mg total) by mouth at bedtime as needed. 60 capsule 5  . Fluticasone Propionate 0.05 % LOTN Apply daily to affected area 3 Bottle 3  . fluticasone-salmeterol (ADVAIR HFA) 230-21 MCG/ACT inhaler Inhale 2 puffs into the lungs every morning.    . furosemide (LASIX) 40 MG tablet Take 40  mg by mouth 3 (three) times a week. Monday, Wednesday and Friday    . loperamide (ANTI-DIARRHEAL) 2 MG capsule Take 4 mg by mouth as needed for diarrhea or loose stools (up to 8 capsules per day).     .Marland Kitchenlosartan (COZAAR) 50 MG tablet TAKE 1 TABLET DAILY 90 tablet 2  . Macitentan (OPSUMIT) 10 MG TABS Take 10 mg by mouth daily. 90 tablet 3  . Multiple Vitamin (MULTIVITAMIN WITH MINERALS) TABS tablet Take 1 tablet by mouth daily.    .Marland Kitchenomeprazole (PRILOSEC) 40 MG capsule TAKE 1 CAPSULE DAILY 90 capsule 2  . potassium chloride (K-DUR,KLOR-CON) 10 MEQ tablet Take 20 mEq by mouth daily.    . rosuvastatin (CRESTOR) 5 MG tablet Take 1 tablet (5 mg total) by mouth daily. 30 tablet 5  . Selexipag (UPTRAVI) 1000 MCG TABS Take 1,000 mcg by mouth every morning. 90 tablet 3  . Selexipag 1200 MCG TABS Take 1,200 mcg by mouth every evening. 90 tablet 3  . spironolactone (ALDACTONE) 50 MG tablet TAKE 1 TABLET DAILY 90 tablet 1  . SYMAX-SR 0.375 MG 12 hr tablet TAKE 1 TABLET TWICE A DAY 180 tablet 1  . tadalafil, PAH, (ADCIRCA) 20 MG tablet Take 2 tablets (40 mg total) by mouth daily. 60 tablet 11   No current facility-administered medications on file prior to encounter.     Objective:   Weight Range:  Vital Signs:   Pulse Rate:  [83] 83 (12/06 1357) BP: (120)/(78) 120/78 (12/06 1357) SpO2:  [97 %] 97 % (12/06 1357) Weight:  [212 lb (96.2 kg)] 212 lb (96.2 kg) (12/06 1357)        Wt Readings from Last 3 Encounters:  10/08/17 212 lb (96.2 kg)  10/01/17 210 lb (95.3 kg)  09/22/17 207 lb 9.6 oz (94.2 kg)    Physical Exam: General:  Well appearing. No resp difficulty. Walked slowly.  HEENT: normal Neck: supple.JVP to jaw. Carotids 2+ bilat; no bruits. No lymphadenopathy or thryomegaly appreciated. Cor: PMI nondisplaced. Regular rate & rhythm. No rubs, gallops. 2/6 TR murmur P2 or murmurs. Lungs: clear Abdomen: soft, nontender, nondistended. No hepatosplenomegaly. No bruits or masses. Good bowel  sounds. Extremities: no cyanosis, clubbing, rash, R and LLE 3+ edema Neuro: alert & orientedx3, cranial nerves grossly intact. moves all 4 extremities w/o difficulty. Affect pleasant   Assessment/Plan     1. PAH: Suspected portopulmonary HTN in setting of ETOH cirrhosis. -NYHA III.  Volume status elevated. Give 40 mg IV lasix + 20 meq potassium in the clinic. >  250 cc urine output.   -Continue lasix 40 mg three times a week.  - Cut back selexipag to 1000 mcg twice a day due to diarrhea and headaches  - Continue macitentan 10 daily and adcirca 40 mg daily,- Discussed need for complete ETOH cessation.  2. CKD III:  - Creatinine 1.63  -BMET in am  3. Cirrhosis:  - Likely combination of RV failure and ETOH - As above, again recommended complete cessation of ETOH.  - Follows with Dr. Ardis Hughs.  4. Syncope - LINQ placed 01/01/17 . 5. HTN:  - Slightly elevated. Will continue to follow.  6. OSA:  -Has mild OSA with AHI 12 and desats down to 82%.  - Intolerant CPAP.  7. Pancytopenia:  - Has also seen Dr. Alen Blew in Hematology - felt like it is due to splenic sequestration.  8. A/C Diastolic Heart Failure Volume status elevated. Given IV lasix in the clinic. As noted above.  9. ETOH Discussed alcohol cessation.   Follow tomorrow to reassess volume status. Check BMET tomorrow. May need to set up [place] next week.  Greater than 50% of the (total minutes 50) visit spent in counseling/coordination of care regarding IV diuresis and limiting high salt diet.   Darrick Grinder, NP  1:52 PM

## 2017-10-09 ENCOUNTER — Ambulatory Visit (HOSPITAL_COMMUNITY)
Admission: RE | Admit: 2017-10-09 | Discharge: 2017-10-09 | Disposition: A | Discharge status: Home / Self Care | Payer: Medicare Other | Source: Ambulatory Visit | Attending: Internal Medicine | Admitting: Internal Medicine

## 2017-10-09 ENCOUNTER — Encounter (HOSPITAL_COMMUNITY): Payer: Self-pay

## 2017-10-09 VITALS — BP 110/66 | HR 101 | Wt 208.0 lb

## 2017-10-09 DIAGNOSIS — K766 Portal hypertension: Secondary | ICD-10-CM | POA: Diagnosis not present

## 2017-10-09 DIAGNOSIS — Z79899 Other long term (current) drug therapy: Secondary | ICD-10-CM | POA: Diagnosis not present

## 2017-10-09 DIAGNOSIS — R55 Syncope and collapse: Secondary | ICD-10-CM | POA: Insufficient documentation

## 2017-10-09 DIAGNOSIS — I2729 Other secondary pulmonary hypertension: Secondary | ICD-10-CM | POA: Diagnosis not present

## 2017-10-09 DIAGNOSIS — K7030 Alcoholic cirrhosis of liver without ascites: Secondary | ICD-10-CM | POA: Diagnosis not present

## 2017-10-09 DIAGNOSIS — I272 Pulmonary hypertension, unspecified: Secondary | ICD-10-CM | POA: Insufficient documentation

## 2017-10-09 DIAGNOSIS — I2510 Atherosclerotic heart disease of native coronary artery without angina pectoris: Secondary | ICD-10-CM | POA: Insufficient documentation

## 2017-10-09 DIAGNOSIS — D61818 Other pancytopenia: Secondary | ICD-10-CM | POA: Diagnosis not present

## 2017-10-09 DIAGNOSIS — I2721 Secondary pulmonary arterial hypertension: Secondary | ICD-10-CM | POA: Diagnosis not present

## 2017-10-09 DIAGNOSIS — N183 Chronic kidney disease, stage 3 unspecified: Secondary | ICD-10-CM

## 2017-10-09 DIAGNOSIS — G4733 Obstructive sleep apnea (adult) (pediatric): Secondary | ICD-10-CM | POA: Diagnosis not present

## 2017-10-09 DIAGNOSIS — I5033 Acute on chronic diastolic (congestive) heart failure: Secondary | ICD-10-CM | POA: Insufficient documentation

## 2017-10-09 DIAGNOSIS — I130 Hypertensive heart and chronic kidney disease with heart failure and stage 1 through stage 4 chronic kidney disease, or unspecified chronic kidney disease: Secondary | ICD-10-CM | POA: Diagnosis not present

## 2017-10-09 DIAGNOSIS — R51 Headache: Secondary | ICD-10-CM | POA: Insufficient documentation

## 2017-10-09 DIAGNOSIS — J449 Chronic obstructive pulmonary disease, unspecified: Secondary | ICD-10-CM | POA: Insufficient documentation

## 2017-10-09 DIAGNOSIS — R197 Diarrhea, unspecified: Secondary | ICD-10-CM | POA: Insufficient documentation

## 2017-10-09 DIAGNOSIS — E8770 Fluid overload, unspecified: Secondary | ICD-10-CM | POA: Diagnosis not present

## 2017-10-09 DIAGNOSIS — I5032 Chronic diastolic (congestive) heart failure: Secondary | ICD-10-CM | POA: Diagnosis not present

## 2017-10-09 DIAGNOSIS — I50810 Right heart failure, unspecified: Secondary | ICD-10-CM | POA: Diagnosis not present

## 2017-10-09 LAB — BRAIN NATRIURETIC PEPTIDE: B Natriuretic Peptide: 335.3 pg/mL — ABNORMAL HIGH (ref 0.0–100.0)

## 2017-10-09 LAB — BASIC METABOLIC PANEL
Anion gap: 9 (ref 5–15)
BUN: 32 mg/dL — AB (ref 6–20)
CALCIUM: 8.7 mg/dL — AB (ref 8.9–10.3)
CHLORIDE: 111 mmol/L (ref 101–111)
CO2: 18 mmol/L — AB (ref 22–32)
CREATININE: 1.75 mg/dL — AB (ref 0.61–1.24)
GFR calc Af Amer: 45 mL/min — ABNORMAL LOW (ref >60)
GFR calc non Af Amer: 39 mL/min — ABNORMAL LOW (ref >60)
GLUCOSE: 109 mg/dL — AB (ref 65–99)
Potassium: 4.3 mmol/L (ref 3.5–5.1)
Sodium: 138 mmol/L (ref 135–145)

## 2017-10-09 MED ORDER — SELEXIPAG 1000 MCG PO TABS: 1000.0000 ug | ORAL_TABLET | Freq: Two times a day (BID) | ORAL | 11 refills | Status: DC

## 2017-10-09 MED ORDER — FUROSEMIDE 40 MG PO TABS: 40.0000 mg | ORAL_TABLET | Freq: Every day | ORAL | 11 refills | Status: DC

## 2017-10-09 MED FILL — FUROSEMIDE 40 MG TAB: 40 | 30 days supply | Qty: 30 | Fill #0

## 2017-10-09 NOTE — Patient Instructions (Addendum)
Take Selexipag 1,000 mcg twice daily.  Take Lasix 80 mg (2 tabs) daily for two days. Take Lasix 40 mg (1 tab) once daily.  Routine lab work today. Will notify you of abnormal results, otherwise no news is good news!  Follow up 2 weeks with Amy Clegg NP-C.  ___________________________________________________________________ [facility] Code: 8002  Take all medication as prescribed the day of your appointment. Bring all medications with you to your appointment.  Do the following things EVERYDAY: 1) Weigh yourself in the morning before breakfast. Write it down and keep it in a log. 2) Take your medicines as prescribed 3) Eat low salt foods-Limit salt (sodium) to 2000 mg per day.  4) Stay as active as you can everyday 5) Limit all fluids for the day to less than 2 liters

## 2017-10-09 NOTE — Progress Notes (Signed)
ADVANCED HF CLINIC NOTE  Patient ID: Darren Mueller, male   DOB: [birth year], 66 y.o.   MRN: [id number] GI : Dr Ardis Hughs PCP: Dr Charlett Blake  Subjective:    Darren Mueller ("Darren Mueller") is a 66 yo with history of COPD, portopulmonary hypertension with RV failure, and ETOH cirrhosis.  Admitted in 6/17 with CP and SOB. Was diuresed. Troponins normal. Underwent R/L cath. Minimal CAD with moderate PAH and preserved cardiac output.   Admitted 06/2016 and 07/2016 with syncope.  On September admission found to have elevated ETOH level as well as R 6th rib fracture.   Wore 30 day monitory up until 08/28/16. No AF noted. No events.  9% tachycardia.   Admitted [facility] end of February after syncopal event. LINQ placed.   Admitted 7/4 through 05/08/17 with volume depletion. Diuretics held and changed to as needed for weight 200 pounds or greater. Discharge weight was 194 pounds.   Today he returns for HF follow up. Yesterday he was given 40 mg IV lasix in our clinic for volume overload. Also selexipag was cut back 1000/1000 twice daily. Feeling a little better. Mild dyspnea with exertion. Denies PND/orthopnea. Did not weigh today. Taking all medications. Tries to follow low salt diet.   PAH meds 1) Macitnentan 10 mg daily 2) Adcirca 40 mg daily  3) Selexipeg  1000/1200 daily  Studies: ECHO 12/14 EF 55-60% Peak PA pressure 35. Severe RV dysfunction  ECHO 7/15 EF 60% RV moderately to severely dilated. Moderate HK RVSP 72m HG  ECHO 6/16 EF 60% RV moderately to severely dilated. Severe HK RVSP ~65 mm HG. D-shaped septum Echo 2/17 LVEF 60-65% RV massively dilated. Flat septum. Severe HK. Moderate TR RVSP ~65. IVC small. No effusion  ECHO 01/01/2017 EF 50-55% Grade I DD. RV severely dilated. Peak PA pressure 62 mm hg  Cath 6/17 Mid RCA lesion, 20% stenosed. Dist LAD lesion, 20% stenosed. Ao = 107/70 (87) LV = 106/3/11 RA = 4 RV = 74/11 PA = 74/26 (45) PCW = 6 Fick cardiac output/index = 4.6.2.2 PVR =  8.5 Ao sat = 95% PA sat = 61%, 58%  RHC 2/17 RA = 11 RV = 80/16/19 PA = 83/61 (71) PCW = 9 Fick cardiac output/index = 5.6/2.6 PVR = 10.3 WU Ao sat = 94% PA sat = 65%, 68% High SVC sat = 65%  RHC 2/14: RA = 13  RV = 63/5/14  PA = 65/24 (41)  PCW = 6  Hepatic wedge = 21  Fick cardiac output/index = 3.8/1.8  PVR = 9.1  FA sat = 94%  PA sat = 58%, 56%  SVC = 54%  RHC 06/06/14 RA = 5  RV = 73/5/9  PA = 81/25 (46)  PCW = 7  Fick cardiac output/index = 6.0/2.8  PVR = 6.5 Woods  FA sat = 95%  PA sat = 71%, 67%  Unable to get hepatic wedge  PFTs 3/14 FEV1 2.02 L (58%) FVC 2.7L (54%) FEV1/FVC (89%) DLCO 53%  PFTs 1/17 FEV1 2.05 L (61%) FVC 2.48L (56%) DLCO 44%  Event Monitor - NSR 10/26/13.   Ab u/s 8/16: + cirrhosis/mild to moderate splenomegaly  6 min walk 01/10/13, 1290 feet  6MW (01/10/14) = 1280 feet (380 m) 6MW (9/15) = 1350 feet (411 m) O2 sats ranged from 89-95% on room air, HR ranged from 100-129. 6MW (6/16) = 384 meters  6MW (2/17) = 1250 feet (3861m6MW (8/17) = 1320 feet      Current Outpatient Medications  on File Prior to Encounter  Medication Sig Dispense Refill  . Albuterol Sulfate (PROAIR RESPICLICK) 119 (90 Base) MCG/ACT AEPB Inhale 2 puffs into Darren lungs every 6 (six) hours as needed (SOB or wheezing).    Marland Kitchen allopurinol (ZYLOPRIM) 100 MG tablet Take 200 mg by mouth daily.    . colchicine 0.6 MG tablet TAKE 1 TABLET (0.6 MG TOTAL) BY MOUTH 2 (TWO) TIMES DAILY. 20 tablet 1  . doxepin (SINEQUAN) 10 MG capsule Take 1-2 capsules (10-20 mg total) by mouth at bedtime as needed. 60 capsule 5  . Fluticasone Propionate 0.05 % LOTN Apply daily to affected area 3 Bottle 3  . fluticasone-salmeterol (ADVAIR HFA) 230-21 MCG/ACT inhaler Inhale 2 puffs into Darren lungs every morning.    . furosemide (LASIX) 40 MG tablet Take 40 mg by mouth 3 (three) times a week. Monday, Wednesday and Friday    . loperamide (ANTI-DIARRHEAL) 2 MG capsule Take 4 mg by mouth as  needed for diarrhea or loose stools (up to 8 capsules per day).     Marland Kitchen losartan (COZAAR) 50 MG tablet TAKE 1 TABLET DAILY 90 tablet 2  . Macitentan (OPSUMIT) 10 MG TABS Take 10 mg by mouth daily. 90 tablet 3  . Multiple Vitamin (MULTIVITAMIN WITH MINERALS) TABS tablet Take 1 tablet by mouth daily.    Marland Kitchen omeprazole (PRILOSEC) 40 MG capsule TAKE 1 CAPSULE DAILY 90 capsule 2  . potassium chloride (K-DUR,KLOR-CON) 10 MEQ tablet Take 20 mEq by mouth daily.    . rosuvastatin (CRESTOR) 5 MG tablet Take 1 tablet (5 mg total) by mouth daily. 30 tablet 5  . Selexipag (UPTRAVI) 1000 MCG TABS Take 1,000 mcg by mouth 2 (two) times daily. 90 tablet 3  . spironolactone (ALDACTONE) 50 MG tablet TAKE 1 TABLET DAILY 90 tablet 1  . SYMAX-SR 0.375 MG 12 hr tablet TAKE 1 TABLET TWICE A DAY 180 tablet 1  . tadalafil, PAH, (ADCIRCA) 20 MG tablet Take 2 tablets (40 mg total) by mouth daily. 60 tablet 11   No current facility-administered medications on file prior to encounter.     Objective:   Weight Range:  Vital Signs:   Pulse Rate:  [101] 101 (12/07 0850) BP: (110)/(66) 110/66 (12/07 0850) SpO2:  [96 %] 96 % (12/07 0850) Weight:  [208 lb (94.3 kg)] 208 lb (94.3 kg) (12/07 0850)        Wt Readings from Last 3 Encounters:  10/09/17 208 lb (94.3 kg)  10/08/17 210 lb (95.3 kg)  10/01/17 210 lb (95.3 kg)    Physical Exam: General:  Well appearing. No resp difficulty. Walked in [facility] without difficulty. HEENT: normal Neck: supple. no JVD. Carotids 2+ bilat; no bruits. No lymphadenopathy or thryomegaly appreciated. Cor: PMI nondisplaced. Regular rate & rhythm. No rubs, gallops. 2/6 TR murmur P2. Lungs: clear Abdomen: soft, nontender, nondistended. No hepatosplenomegaly. No bruits or masses. Good bowel sounds. Extremities: no cyanosis, clubbing, rash, R and LLE 2+ edema Neuro: alert & orientedx3, cranial nerves grossly intact. moves all 4 extremities w/o difficulty. Affect  pleasant    Assessment/Plan     1. PAH: Suspected portopulmonary HTN in setting of ETOH cirrhosis. -NYHA III.  Volume status improving.Weight down a couple of pounds.  Increase lasix 80 mg daily for 2 days then he will take 40 mg daily.  - Continue selexipag to 1000 mcg twice a day. Intolerant higher dose  due to diarrhea and headaches  - Continue macitentan 10 daily and adcirca 40 mg daily,-  Discussed need for complete ETOH cessation.  2. CKD III:  - BMET today.  3. Cirrhosis:  - Likely combination of RV failure and ETOH - As above, again recommended complete cessation of ETOH.  - Follows with Dr. Ardis Hughs.  4. Syncope - LINQ placed 01/01/17 . 5. HTN:  - Stable.  6. OSA:  -Has mild OSA with AHI 12 and desats down to 82%.  - Intolerant CPAP.  7. Pancytopenia:  - Has also seen Dr. Alen Blew in Hematology - felt like it is due to splenic sequestration.  8. A/C Diastolic Heart Failure  See above for diuretic regimen.  9. ETOH Discussed alcohol cessation.   BMET BNP today.   Follow up in 2 weeks to reassess volume status.  Darrick Grinder, NP  8:53 AM

## 2017-10-13 LAB — CUP PACEART REMOTE DEVICE CHECK
Date Time Interrogation Session: 20181126211451
MDC IDC PG IMPLANT DT: 20180301

## 2017-10-23 ENCOUNTER — Ambulatory Visit (HOSPITAL_COMMUNITY)
Admission: RE | Admit: 2017-10-23 | Discharge: 2017-10-23 | Disposition: A | Discharge status: Home / Self Care | Payer: Medicare Other | Source: Ambulatory Visit | Attending: Internal Medicine | Admitting: Internal Medicine

## 2017-10-23 VITALS — BP 132/76 | HR 76 | Wt 210.0 lb

## 2017-10-23 DIAGNOSIS — K7460 Unspecified cirrhosis of liver: Secondary | ICD-10-CM | POA: Diagnosis not present

## 2017-10-23 DIAGNOSIS — I2721 Secondary pulmonary arterial hypertension: Secondary | ICD-10-CM | POA: Diagnosis not present

## 2017-10-23 DIAGNOSIS — G4733 Obstructive sleep apnea (adult) (pediatric): Secondary | ICD-10-CM | POA: Insufficient documentation

## 2017-10-23 DIAGNOSIS — R197 Diarrhea, unspecified: Secondary | ICD-10-CM | POA: Diagnosis not present

## 2017-10-23 DIAGNOSIS — J449 Chronic obstructive pulmonary disease, unspecified: Secondary | ICD-10-CM | POA: Diagnosis not present

## 2017-10-23 DIAGNOSIS — R51 Headache: Secondary | ICD-10-CM | POA: Diagnosis not present

## 2017-10-23 DIAGNOSIS — S2231XD Fracture of one rib, right side, subsequent encounter for fracture with routine healing: Secondary | ICD-10-CM | POA: Diagnosis not present

## 2017-10-23 DIAGNOSIS — I2729 Other secondary pulmonary hypertension: Secondary | ICD-10-CM | POA: Diagnosis not present

## 2017-10-23 DIAGNOSIS — I272 Pulmonary hypertension, unspecified: Secondary | ICD-10-CM | POA: Insufficient documentation

## 2017-10-23 DIAGNOSIS — N183 Chronic kidney disease, stage 3 unspecified: Secondary | ICD-10-CM

## 2017-10-23 DIAGNOSIS — Z79899 Other long term (current) drug therapy: Secondary | ICD-10-CM | POA: Diagnosis not present

## 2017-10-23 DIAGNOSIS — I5033 Acute on chronic diastolic (congestive) heart failure: Secondary | ICD-10-CM | POA: Diagnosis not present

## 2017-10-23 DIAGNOSIS — K7031 Alcoholic cirrhosis of liver with ascites: Secondary | ICD-10-CM

## 2017-10-23 DIAGNOSIS — D61818 Other pancytopenia: Secondary | ICD-10-CM | POA: Diagnosis not present

## 2017-10-23 DIAGNOSIS — I129 Hypertensive chronic kidney disease with stage 1 through stage 4 chronic kidney disease, or unspecified chronic kidney disease: Secondary | ICD-10-CM | POA: Diagnosis not present

## 2017-10-23 DIAGNOSIS — X58XXXD Exposure to other specified factors, subsequent encounter: Secondary | ICD-10-CM | POA: Diagnosis not present

## 2017-10-23 DIAGNOSIS — R55 Syncope and collapse: Secondary | ICD-10-CM | POA: Insufficient documentation

## 2017-10-23 DIAGNOSIS — I50810 Right heart failure, unspecified: Secondary | ICD-10-CM

## 2017-10-23 DIAGNOSIS — K766 Portal hypertension: Secondary | ICD-10-CM | POA: Diagnosis not present

## 2017-10-23 DIAGNOSIS — I2510 Atherosclerotic heart disease of native coronary artery without angina pectoris: Secondary | ICD-10-CM | POA: Diagnosis not present

## 2017-10-23 LAB — BASIC METABOLIC PANEL
Anion gap: 7 (ref 5–15)
BUN: 40 mg/dL — AB (ref 6–20)
CALCIUM: 9 mg/dL (ref 8.9–10.3)
CHLORIDE: 109 mmol/L (ref 101–111)
CO2: 19 mmol/L — AB (ref 22–32)
CREATININE: 2.02 mg/dL — AB (ref 0.61–1.24)
GFR calc non Af Amer: 32 mL/min — ABNORMAL LOW (ref >60)
GFR, EST AFRICAN AMERICAN: 38 mL/min — AB (ref >60)
Glucose, Bld: 120 mg/dL — ABNORMAL HIGH (ref 65–99)
Potassium: 4.8 mmol/L (ref 3.5–5.1)
Sodium: 135 mmol/L (ref 135–145)

## 2017-10-23 LAB — BRAIN NATRIURETIC PEPTIDE: B Natriuretic Peptide: 703.7 pg/mL — ABNORMAL HIGH (ref 0.0–100.0)

## 2017-10-23 NOTE — Progress Notes (Signed)
ADVANCED HF CLINIC NOTE  Patient ID: Darren Mueller, male   DOB: [birth year], 66 y.o.   MRN: [id number] GI : Dr Ardis Hughs PCP: Dr Charlett Blake  Subjective:    Darren Mueller ("[name]") is a 66 yo with history of COPD, portopulmonary hypertension with RV failure, and ETOH cirrhosis.  Admitted in 6/17 with CP and SOB. Was diuresed. Troponins normal. Underwent R/L cath. Minimal CAD with moderate PAH and preserved cardiac output.   Admitted 06/2016 and 07/2016 with syncope.  On September admission found to have elevated ETOH level as well as R 6th rib fracture.   Wore 30 day monitory up until 08/28/16. No AF noted. No events.  9% tachycardia.   Admitted the end of February after syncopal event. LINQ placed.   Admitted 7/4 through 05/08/17 with volume depletion. Diuretics held and changed to as needed for weight 200 pounds or greater. Discharge weight was 194 pounds.   Today he returns for HF follow up. 2 weeks ago he was given 40 mg IV lasix and po  lasix was increased to  80 mg daily for 2 days then was instructed to cut back to lasix 40 mg daily. He says he was confused and has only been taking 40 mg po lasix every other day. Weight at home went down form 210>203 pound but has gone back up to 210 pounds. Has had some chest discomfort relieved with tylenol and advil. Feeling better. Remains SOB with exertion but says this is his baseline. + Orthopnea. Eating high salt food such as cream of chicken soup.  Taking all medications.   PAH meds 1) Macitnentan 10 mg daily 2) Adcirca 40 mg daily  3) Selexipeg  1000/1200 daily  Studies: ECHO 12/14 EF 55-60% Peak PA pressure 35. Severe RV dysfunction  ECHO 7/15 EF 60% RV moderately to severely dilated. Moderate HK RVSP 82m HG  ECHO 6/16 EF 60% RV moderately to severely dilated. Severe HK RVSP ~65 mm HG. D-shaped septum Echo 2/17 LVEF 60-65% RV massively dilated. Flat septum. Severe HK. Moderate TR RVSP ~65. IVC small. No effusion  ECHO 01/01/2017 EF 50-55%  Grade I DD. RV severely dilated. Peak PA pressure 62 mm hg  Cath 6/17 Mid RCA lesion, 20% stenosed. Dist LAD lesion, 20% stenosed. Ao = 107/70 (87) LV = 106/3/11 RA = 4 RV = 74/11 PA = 74/26 (45) PCW = 6 Fick cardiac output/index = 4.6.2.2 PVR = 8.5 Ao sat = 95% PA sat = 61%, 58%  RHC 2/17 RA = 11 RV = 80/16/19 PA = 83/61 (71) PCW = 9 Fick cardiac output/index = 5.6/2.6 PVR = 10.3 WU Ao sat = 94% PA sat = 65%, 68% High SVC sat = 65%  RHC 2/14: RA = 13  RV = 63/5/14  PA = 65/24 (41)  PCW = 6  Hepatic wedge = 21  Fick cardiac output/index = 3.8/1.8  PVR = 9.1  FA sat = 94%  PA sat = 58%, 56%  SVC = 54%  RHC 06/06/14 RA = 5  RV = 73/5/9  PA = 81/25 (46)  PCW = 7  Fick cardiac output/index = 6.0/2.8  PVR = 6.5 Woods  FA sat = 95%  PA sat = 71%, 67%  Unable to get hepatic wedge  PFTs 3/14 FEV1 2.02 L (58%) FVC 2.7L (54%) FEV1/FVC (89%) DLCO 53%  PFTs 1/17 FEV1 2.05 L (61%) FVC 2.48L (56%) DLCO 44%  Event Monitor - NSR 10/26/13.   Ab u/s 8/16: + cirrhosis/mild to  moderate splenomegaly  6 min walk 01/10/13, 1290 feet  6MW (01/10/14) = 1280 feet (380 m) 6MW (9/15) = 1350 feet (411 m) O2 sats ranged from 89-95% on room air, HR ranged from 100-129. 6MW (6/16) = 384 meters  6MW (2/17) = 1250 feet (319m 6MW (8/17) = 1320 feet      Current Outpatient Medications on File Prior to Encounter  Medication Sig Dispense Refill  . acetaminophen (TYLENOL) 500 MG tablet Take 500 mg by mouth every 6 (six) hours as needed.    . Albuterol Sulfate (PROAIR RESPICLICK) 1409(90 Base) MCG/ACT AEPB Inhale 2 puffs into the lungs every 6 (six) hours as needed (SOB or wheezing).    .Marland Kitchenallopurinol (ZYLOPRIM) 100 MG tablet Take 200 mg by mouth daily.    . colchicine 0.6 MG tablet TAKE 1 TABLET (0.6 MG TOTAL) BY MOUTH 2 (TWO) TIMES DAILY. 20 tablet 1  . doxepin (SINEQUAN) 10 MG capsule Take 1-2 capsules (10-20 mg total) by mouth at bedtime as needed. 60 capsule 5  .  Fluticasone Propionate 0.05 % LOTN Apply daily to affected area 3 Bottle 3  . fluticasone-salmeterol (ADVAIR HFA) 230-21 MCG/ACT inhaler Inhale 2 puffs into the lungs every morning.    . furosemide (LASIX) 40 MG tablet Take 40 mg by mouth every other day.    . loperamide (ANTI-DIARRHEAL) 2 MG capsule Take 4 mg by mouth as needed for diarrhea or loose stools (up to 8 capsules per day).     .Marland Kitchenlosartan (COZAAR) 50 MG tablet TAKE 1 TABLET DAILY 90 tablet 2  . Macitentan (OPSUMIT) 10 MG TABS Take 10 mg by mouth daily. 90 tablet 3  . Multiple Vitamin (MULTIVITAMIN WITH MINERALS) TABS tablet Take 1 tablet by mouth daily.    .Marland Kitchenomeprazole (PRILOSEC) 40 MG capsule TAKE 1 CAPSULE DAILY 90 capsule 2  . potassium chloride (K-DUR) 10 MEQ tablet Take 10 mEq by mouth daily.    . rosuvastatin (CRESTOR) 5 MG tablet Take 1 tablet (5 mg total) by mouth daily. 30 tablet 5  . Selexipag (UPTRAVI) 1000 MCG TABS Take 1,000 mcg by mouth 2 (two) times daily. 60 tablet 11  . spironolactone (ALDACTONE) 50 MG tablet TAKE 1 TABLET DAILY 90 tablet 1  . SYMAX-SR 0.375 MG 12 hr tablet TAKE 1 TABLET TWICE A DAY 180 tablet 1  . tadalafil, PAH, (ADCIRCA) 20 MG tablet Take 2 tablets (40 mg total) by mouth daily. 60 tablet 11   No current facility-administered medications on file prior to encounter.     Objective:   Weight Range:  Vital Signs:   Pulse Rate:  [76] 76 (12/21 0856) BP: (132)/(76) 132/76 (12/21 0856) SpO2:  [100 %] 100 % (12/21 0856) Weight:  [210 lb (95.3 kg)] 210 lb (95.3 kg) (12/21 0856)       Wt Readings from Last 3 Encounters:  10/23/17 210 lb (95.3 kg)  10/09/17 208 lb (94.3 kg)  10/08/17 210 lb (95.3 kg)    Physical Exam: General:  Well appearing. No resp difficulty. Walked slowly in the clinic HEENT: normal Neck: supple. JVP to 10. Carotids 2+ bilat; no bruits. No lymphadenopathy or thryomegaly appreciated. Cor: PMI nondisplaced. Regular rate & rhythm. No rubs, gallops . 2/6/ TR murmur. Lungs:  clear Abdomen: soft, nontender, nondistended. No hepatosplenomegaly. No bruits or masses. Good bowel sounds. Extremities: no cyanosis, clubbing, rash, R and LLE 1-2+ edema Neuro: alert & orientedx3, cranial nerves grossly intact. moves all 4 extremities w/o difficulty. Affect pleasant  Assessment/Plan     1. PAH: Suspected portopulmonary HTN in setting of ETOH cirrhosis. -NYHA III.  -  Volume status in the setting of reduced diuetic and high salt foods.  - Increase lasix to 80 mg daily x2 days then 40 mg po daily.  - Continue selexipag to 1000 mcg twice a day. Intolerant higher dose  due to diarrhea and headaches .  - Continue macitentan 10 daily and adcirca 40 mg daily,- Discussed need for complete ETOH cessation.  -BMEt and BNP today.  2. CKD III:  BMEt today  3. Cirrhosis:  - Likely combination of RV failure and ETOH - As above, again recommended complete cessation of ETOH.  - Follows with Dr. Ardis Hughs.  4. Syncope - LINQ placed 01/01/17 .  Reviewed reading. No A fib/VT noted.  5. HTN:  - Stable.  6. OSA:  -Has mild OSA with AHI 12 and desats down to 82%.  - Intolerant CPAP.  7. Pancytopenia:  - Has also seen Dr. Alen Blew in Hematology - felt like it is due to splenic sequestration.  8. A/C Diastolic Heart Failure See above.  9. ETOH Discussed alcohol cessation.   Follow up in 4 weeks. He was instructed to call HF clinic if he has worsening symptoms.  Greater than 50% of the (total minutes 25) visit spent in counseling/coordination of care regarding the above.    Darrick Grinder, NP  9:04 AM

## 2017-10-23 NOTE — Patient Instructions (Addendum)
Routine lab work today. Will notify you of abnormal results, otherwise no news is good news!  INCREASE Lasix to 80 mg (2 tabs) once daily x 2 days, then to 40 mg (1 tab) once daily thereafter.  Follow up 1 month with Dr. Haroldine Laws.  Take all medication as prescribed the day of your appointment. Bring all medications with you to your appointment.  Do the following things EVERYDAY: 1) Weigh yourself in the morning before breakfast. Write it down and keep it in a log. 2) Take your medicines as prescribed 3) Eat low salt foods-Limit salt (sodium) to 2000 mg per day.  4) Stay as active as you can everyday 5) Limit all fluids for the day to less than 2 liters

## 2017-10-28 ENCOUNTER — Ambulatory Visit (INDEPENDENT_AMBULATORY_CARE_PROVIDER_SITE_OTHER): Payer: Medicare Other | Admitting: *Deleted

## 2017-10-28 DIAGNOSIS — R55 Syncope and collapse: Secondary | ICD-10-CM | POA: Diagnosis not present

## 2017-10-29 NOTE — Progress Notes (Signed)
Carelink Summary Report / Loop Recorder 

## 2017-11-05 ENCOUNTER — Other Ambulatory Visit: Payer: Self-pay | Admitting: Family Medicine

## 2017-11-05 MED FILL — TADALAFIL (PAH) 20 MG TABS: 20 | 30 days supply | Qty: 60 | Fill #6

## 2017-11-05 MED FILL — ROSUVASTATIN CALCIUM 5 MG T: 5 | 30 days supply | Qty: 30 | Fill #3

## 2017-11-05 MED FILL — FUROSEMIDE 40 MG TAB: 40 | 30 days supply | Qty: 30 | Fill #1

## 2017-11-05 MED FILL — COLCHICINE 0.6 MG TABS: 0.6 | 10 days supply | Qty: 20 | Fill #0

## 2017-11-08 LAB — CUP PACEART REMOTE DEVICE CHECK
Date Time Interrogation Session: 20181226210906
MDC IDC PG IMPLANT DT: 20180301

## 2017-11-18 ENCOUNTER — Emergency Department (HOSPITAL_COMMUNITY): Payer: Medicare Other

## 2017-11-18 ENCOUNTER — Emergency Department (HOSPITAL_COMMUNITY)
Admission: EM | Admit: 2017-11-18 | Discharge: 2017-11-18 | Disposition: A | Discharge status: Home / Self Care | Payer: Medicare Other | Attending: Emergency Medicine | Admitting: Emergency Medicine

## 2017-11-18 ENCOUNTER — Encounter (HOSPITAL_COMMUNITY): Payer: Self-pay | Admitting: *Deleted

## 2017-11-18 ENCOUNTER — Encounter: Payer: Self-pay | Admitting: Neurology

## 2017-11-18 ENCOUNTER — Ambulatory Visit (HOSPITAL_BASED_OUTPATIENT_CLINIC_OR_DEPARTMENT_OTHER)
Admission: RE | Admit: 2017-11-18 | Discharge: 2017-11-18 | Disposition: A | Discharge status: Home / Self Care | Payer: Medicare Other | Source: Ambulatory Visit | Attending: Internal Medicine | Admitting: Internal Medicine

## 2017-11-18 VITALS — BP 150/90 | HR 102 | Wt 209.0 lb

## 2017-11-18 DIAGNOSIS — N289 Disorder of kidney and ureter, unspecified: Secondary | ICD-10-CM

## 2017-11-18 DIAGNOSIS — I129 Hypertensive chronic kidney disease with stage 1 through stage 4 chronic kidney disease, or unspecified chronic kidney disease: Secondary | ICD-10-CM | POA: Insufficient documentation

## 2017-11-18 DIAGNOSIS — I2721 Secondary pulmonary arterial hypertension: Secondary | ICD-10-CM

## 2017-11-18 DIAGNOSIS — K766 Portal hypertension: Secondary | ICD-10-CM | POA: Diagnosis not present

## 2017-11-18 DIAGNOSIS — E039 Hypothyroidism, unspecified: Secondary | ICD-10-CM | POA: Diagnosis not present

## 2017-11-18 DIAGNOSIS — Z87891 Personal history of nicotine dependence: Secondary | ICD-10-CM | POA: Insufficient documentation

## 2017-11-18 DIAGNOSIS — N183 Chronic kidney disease, stage 3 unspecified: Secondary | ICD-10-CM

## 2017-11-18 DIAGNOSIS — R569 Unspecified convulsions: Secondary | ICD-10-CM

## 2017-11-18 DIAGNOSIS — Z79899 Other long term (current) drug therapy: Secondary | ICD-10-CM | POA: Insufficient documentation

## 2017-11-18 DIAGNOSIS — N182 Chronic kidney disease, stage 2 (mild): Secondary | ICD-10-CM | POA: Insufficient documentation

## 2017-11-18 DIAGNOSIS — K7030 Alcoholic cirrhosis of liver without ascites: Secondary | ICD-10-CM | POA: Diagnosis not present

## 2017-11-18 DIAGNOSIS — D61818 Other pancytopenia: Secondary | ICD-10-CM

## 2017-11-18 DIAGNOSIS — K7031 Alcoholic cirrhosis of liver with ascites: Secondary | ICD-10-CM | POA: Diagnosis not present

## 2017-11-18 HISTORY — DX: Unspecified convulsions: R56.9

## 2017-11-18 LAB — RAPID URINE DRUG SCREEN, HOSP PERFORMED
Amphetamines: NOT DETECTED
BARBITURATES: NOT DETECTED
BENZODIAZEPINES: NOT DETECTED
COCAINE: NOT DETECTED
Opiates: NOT DETECTED
TETRAHYDROCANNABINOL: NOT DETECTED

## 2017-11-18 LAB — COMPREHENSIVE METABOLIC PANEL
ALT: 23 U/L (ref 17–63)
AST: 65 U/L — ABNORMAL HIGH (ref 15–41)
Albumin: 3.8 g/dL (ref 3.5–5.0)
Alkaline Phosphatase: 171 U/L — ABNORMAL HIGH (ref 38–126)
Anion gap: 12 (ref 5–15)
BUN: 35 mg/dL — ABNORMAL HIGH (ref 6–20)
CHLORIDE: 104 mmol/L (ref 101–111)
CO2: 18 mmol/L — ABNORMAL LOW (ref 22–32)
CREATININE: 2.09 mg/dL — AB (ref 0.61–1.24)
Calcium: 9 mg/dL (ref 8.9–10.3)
GFR, EST AFRICAN AMERICAN: 36 mL/min — AB (ref >60)
GFR, EST NON AFRICAN AMERICAN: 31 mL/min — AB (ref >60)
Glucose, Bld: 102 mg/dL — ABNORMAL HIGH (ref 65–99)
POTASSIUM: 3.4 mmol/L — AB (ref 3.5–5.1)
Sodium: 134 mmol/L — ABNORMAL LOW (ref 135–145)
Total Bilirubin: 2.2 mg/dL — ABNORMAL HIGH (ref 0.3–1.2)
Total Protein: 7.5 g/dL (ref 6.5–8.1)

## 2017-11-18 LAB — URINALYSIS, ROUTINE W REFLEX MICROSCOPIC
Bilirubin Urine: NEGATIVE
Glucose, UA: NEGATIVE mg/dL
Hgb urine dipstick: NEGATIVE
KETONES UR: NEGATIVE mg/dL
LEUKOCYTES UA: NEGATIVE
Nitrite: NEGATIVE
PH: 5 (ref 5.0–8.0)
Protein, ur: NEGATIVE mg/dL
Specific Gravity, Urine: 1.008 (ref 1.005–1.030)

## 2017-11-18 LAB — CBC WITH DIFFERENTIAL/PLATELET
Basophils Absolute: 0 10*3/uL (ref 0.0–0.1)
Basophils Relative: 0 %
EOS ABS: 0 10*3/uL (ref 0.0–0.7)
Eosinophils Relative: 1 %
HEMATOCRIT: 33.9 % — AB (ref 39.0–52.0)
HEMOGLOBIN: 11.6 g/dL — AB (ref 13.0–17.0)
LYMPHS ABS: 0.9 10*3/uL (ref 0.7–4.0)
Lymphocytes Relative: 36 %
MCH: 34.9 pg — AB (ref 26.0–34.0)
MCHC: 34.2 g/dL (ref 30.0–36.0)
MCV: 102.1 fL — ABNORMAL HIGH (ref 78.0–100.0)
MONOS PCT: 13 %
Monocytes Absolute: 0.3 10*3/uL (ref 0.1–1.0)
NEUTROS ABS: 1.3 10*3/uL — AB (ref 1.7–7.7)
NEUTROS PCT: 50 %
Platelets: 43 10*3/uL — ABNORMAL LOW (ref 150–400)
RBC: 3.32 MIL/uL — ABNORMAL LOW (ref 4.22–5.81)
RDW: 15.3 % (ref 11.5–15.5)
WBC: 2.6 10*3/uL — ABNORMAL LOW (ref 4.0–10.5)

## 2017-11-18 LAB — I-STAT CG4 LACTIC ACID, ED
LACTIC ACID, VENOUS: 2.45 mmol/L — AB (ref 0.5–1.9)
Lactic Acid, Venous: 2.14 mmol/L (ref 0.5–1.9)

## 2017-11-18 LAB — CK: Total CK: 133 U/L (ref 49–397)

## 2017-11-18 LAB — ETHANOL: Alcohol, Ethyl (B): 164 mg/dL — ABNORMAL HIGH (ref <10)

## 2017-11-18 MED ORDER — KETOROLAC TROMETHAMINE 30 MG/ML IJ SOLN
30.0000 mg | Freq: Once | INTRAMUSCULAR | Status: AC
  Administered 2017-11-18: 30 mg via INTRAVENOUS
  Filled 2017-11-18: qty 1

## 2017-11-18 MED ORDER — SODIUM CHLORIDE 0.9 % IV SOLN
1000.0000 mg | Freq: Once | INTRAVENOUS | Status: AC
  Administered 2017-11-18: 1000 mg via INTRAVENOUS
  Filled 2017-11-18: qty 10

## 2017-11-18 MED ORDER — LEVETIRACETAM 500 MG PO TABS: 500.0000 mg | ORAL_TABLET | Freq: Two times a day (BID) | ORAL | 0 refills | Status: DC

## 2017-11-18 NOTE — Discharge Instructions (Addendum)
Do not drink any alcohol - it will make your liver worse!!

## 2017-11-18 NOTE — ED Triage Notes (Signed)
Pt arrives via EMS from home. Per report, pt had coughing episode then 1 minute of seizure like activity, while watching TV. Pt was confused after the incident. Pt also had some SOB, lungs clear, sats 97-99%. IV established, 137/83, hr 97, cbg 122, 98% ra. No hx of seizures or meds for the same. Pt also doubled his lasix Sunday night and has an 11lb weight loss.

## 2017-11-18 NOTE — Patient Instructions (Signed)
You have been referred to [facility]  Your physician recommends that you schedule a follow-up appointment in: 1 month

## 2017-11-18 NOTE — ED Notes (Signed)
Patient transported to CT 

## 2017-11-18 NOTE — Progress Notes (Signed)
ADVANCED HF CLINIC NOTE  Patient ID: Darren Mueller, male   DOB: Mar 31, 1951, 67 y.o.   MRN: [id number] GI : Dr Ardis Hughs PCP: Dr Charlett Blake  HPI:  Darren Mueller ("Darren Mueller") is a 67 yo with history of COPD, portopulmonary hypertension with RV failure, and ETOH cirrhosis.  Admitted in 6/17 with CP and SOB. Was diuresed. Troponins normal. Underwent R/L cath. Minimal CAD with moderate PAH and preserved cardiac output.   Admitted 06/2016 and 07/2016 with syncope.  On September admission found to have elevated ETOH level as well as R 6th rib fracture.   Wore 30 day monitory up until 08/28/16. No AF noted. No events.  9% tachycardia.   Admitted Darren end of February after syncopal event. LINQ placed.   Admitted 7/4 through 05/08/17 with volume depletion. Diuretics held and changed to as needed for weight 200 pounds or greater. Discharge weight was 194 pounds.   Presents for acute work-in visit due to recurrent syncope/seizure. Two days ago feet were swelling and took extra lasix. Weight came down 10 pounds to 202. Last night was watching and had seizure in front of son. Went to ER. Head CT. Labs ok except for ETOH 164. Brief palpitations. No CP. Started on Keppra. Mildly dizzy this am. Feels weak. No CP.    PAH meds 1) Macitnentan 10 mg daily 2) Adcirca 40 mg daily  3) Selexipeg  1000/1200 daily  Studies: ECHO 12/14 EF 55-60% Peak PA pressure 35. Severe RV dysfunction  ECHO 7/15 EF 60% RV moderately to severely dilated. Moderate HK RVSP 22m HG  ECHO 6/16 EF 60% RV moderately to severely dilated. Severe HK RVSP ~65 mm HG. D-shaped septum Echo 2/17 LVEF 60-65% RV massively dilated. Flat septum. Severe HK. Moderate TR RVSP ~65. IVC small. No effusion  ECHO 01/01/2017 EF 50-55% Grade I DD. RV severely dilated. Peak PA pressure 62 mm hg  Cath 6/17 Mid RCA lesion, 20% stenosed. Dist LAD lesion, 20% stenosed. Ao = 107/70 (87) LV = 106/3/11 RA = 4 RV = 74/11 PA = 74/26 (45) PCW = 6 Fick cardiac  output/index = 4.6.2.2 PVR = 8.5 Ao sat = 95% PA sat = 61%, 58%  RHC 2/17 RA = 11 RV = 80/16/19 PA = 83/61 (71) PCW = 9 Fick cardiac output/index = 5.6/2.6 PVR = 10.3 WU Ao sat = 94% PA sat = 65%, 68% High SVC sat = 65%  RHC 2/14: RA = 13  RV = 63/5/14  PA = 65/24 (41)  PCW = 6  Hepatic wedge = 21  Fick cardiac output/index = 3.8/1.8  PVR = 9.1  FA sat = 94%  PA sat = 58%, 56%  SVC = 54%  RHC 06/06/14 RA = 5  RV = 73/5/9  PA = 81/25 (46)  PCW = 7  Fick cardiac output/index = 6.0/2.8  PVR = 6.5 [place]  FA sat = 95%  PA sat = 71%, 67%  Unable to get hepatic wedge  PFTs 3/14 FEV1 2.02 L (58%) FVC 2.7L (54%) FEV1/FVC (89%) DLCO 53%  PFTs 1/17 FEV1 2.05 L (61%) FVC 2.48L (56%) DLCO 44%  Event Monitor - NSR 10/26/13.   Ab u/s 8/16: + cirrhosis/mild to moderate splenomegaly  6 min walk 01/10/13, 1290 feet  6MW (01/10/14) = 1280 feet (380 m) 6MW (9/15) = 1350 feet (411 m) O2 sats ranged from 89-95% on room air, HR ranged from 100-129. 6MW (6/16) = 384 meters  6MW (2/17) = 1250 feet (3833m6MW (8/17) =  1320 feet      Current Outpatient Medications on File Prior to Encounter  Medication Sig Dispense Refill  . Albuterol Sulfate (PROAIR RESPICLICK) 149 (90 Base) MCG/ACT AEPB Inhale 2 puffs into Darren lungs every 6 (six) hours as needed (SOB or wheezing).    Marland Kitchen allopurinol (ZYLOPRIM) 100 MG tablet Take 200 mg by mouth daily.    . colchicine 0.6 MG tablet TAKE 1 TABLET (0.6 MG TOTAL) BY MOUTH 2 (TWO) TIMES DAILY. 20 tablet 1  . fluticasone-salmeterol (ADVAIR HFA) 230-21 MCG/ACT inhaler Inhale 2 puffs into Darren lungs every morning.    . furosemide (LASIX) 40 MG tablet Take 80 mg by mouth daily.     Marland Kitchen losartan (COZAAR) 50 MG tablet TAKE 1 TABLET DAILY 90 tablet 2  . Macitentan (OPSUMIT) 10 MG TABS Take 10 mg by mouth daily. 90 tablet 3  . omeprazole (PRILOSEC) 40 MG capsule TAKE 1 CAPSULE DAILY 90 capsule 2  . potassium chloride (K-DUR) 10 MEQ tablet Take 20 mEq by  mouth daily.     . rosuvastatin (CRESTOR) 5 MG tablet Take 1 tablet (5 mg total) by mouth daily. 30 tablet 5  . Selexipag (UPTRAVI) 1000 MCG TABS Take 1,000 mcg by mouth 2 (two) times daily. 60 tablet 11  . spironolactone (ALDACTONE) 50 MG tablet TAKE 1 TABLET DAILY 90 tablet 1  . tadalafil, PAH, (ADCIRCA) 20 MG tablet Take 2 tablets (40 mg total) by mouth daily. 60 tablet 11  . levETIRAcetam (KEPPRA) 500 MG tablet Take 1 tablet (500 mg total) by mouth 2 (two) times daily. (Patient not taking: Reported on 11/18/2017) 60 tablet 0   No current facility-administered medications on file prior to encounter.     Objective:   Weight Range:  Vital Signs:   Temp:  [97.1 F (36.2 C)] 97.1 F (36.2 C) (01/16 0244) Pulse Rate:  [85-102] 102 (01/16 1030) Resp:  [16-23] 16 (01/16 0544) BP: (112-150)/(70-90) 150/90 (01/16 1030) SpO2:  [96 %-99 %] 96 % (01/16 1030) Weight:  [201 lb (91.2 kg)-209 lb (94.8 kg)] 209 lb (94.8 kg) (01/16 1030)       Wt Readings from Last 3 Encounters:  11/18/17 209 lb (94.8 kg)  11/18/17 201 lb (91.2 kg)  10/23/17 210 lb (95.3 kg)    Physical Exam: General:  Weak appearing. No resp difficulty HEENT: normal Neck: supple.JVP 5-6. Carotids 2+ bilat; no bruits. No lymphadenopathy or thryomegaly appreciated. Cor: PMI nondisplaced. Regular rate & rhythm. 2/6 TR prominent P2 Lungs clear  Abdomen: soft, nontender, nondistended. No hepatosplenomegaly. No bruits or masses. Good bowel sounds. Extremities: no cyanosis, clubbing, rash, edema Neuro: alert & orientedx3, cranial nerves grossly intact. moves all 4 extremities w/o difficulty. Affect pleasant  ECG: SR with diffuse RV strain. (Unchanged) Personally reviewed    Assessment/Plan    1. Recurrent syncope/seizure - by history event sounds like a seizure but need to make sure not cardiac source. Seen in ED last night. ETOH level high,. CT brain ok. Started on keppr but has not taken yet (Has h/o seizure in remote  past) - LINQ interrogated personally and no arrhythmias and thus most likely seizure - Agree with Keppra. Refer to neurology - Needs to stop ETOH 2. PAH: Suspected portopulmonary HTN in setting of ETOH cirrhosis. - Chronic NYHA III.  - Volume status stable after recent extra dose of diuretics - Continue selexipag to 1000 mcg twice a day. Intolerant higher dose  due to diarrhea and headaches .  - Continue macitentan 10  daily and adcirca 40 mg daily,- Discussed need for complete ETOH cessation.  -Labs yesterday in ER reviewed and stable  3. CKD III:  -stable 4. Cirrhosis:  - Likely combination of RV failure and ETOH - As above, again recommended complete cessation of ETOH.  - Follows with Dr. Ardis Hughs.  5. HTN:  - Stable.  6. OSA:  -Has mild OSA with AHI 12 and desats down to 82%.  - Intolerant CPAP.  7. Pancytopenia:  - Has also seen Dr. Alen Blew in Hematology - felt like it is due to splenic sequestration.  8. A/C Diastolic Heart Failure See above.   Total time spent 35 minutes. Over half that time spent discussing above.   Glori Bickers, MD  10:51 AM

## 2017-11-18 NOTE — ED Notes (Signed)
Patient c/o anterior chest pain  Rates 8/10 non radiating. Ekg done and shown to Dr. Roxanne Mins aware.

## 2017-11-18 NOTE — ED Notes (Signed)
ED Provider at bedside. 

## 2017-11-18 NOTE — ED Provider Notes (Signed)
[facility] EMERGENCY DEPARTMENT Provider Note   CSN: [id number] Arrival date & time: 11/18/17  0234     History   Chief Complaint Chief Complaint  Patient presents with  . Seizures    HPI Darren Mueller is a 67 y.o. male.  The history is provided by the patient.  He has history of alcohol dependence with alcoholic cirrhosis, congestive heart failure, hypertension, or hypertriglyceridemia, renal insufficiency, seizures and comes in having had a seizure at home.  This was witnessed by a family member.  Patient was watching television and the next thing he remembers, ambulance was there.  He denies urinary or fecal incontinence.  He denies bit lip or tongue.  He does have a history of having had seizures in the past, but he states he has never been an many anticonvulsants.  He does admit to drinking 3 beers tonight and states she will do that 2-3 times a week.  Past Medical History:  Diagnosis Date  . Alcohol dependence ([place]) 03/22/2011   In remission   . Alcohol dependence in remission ([place]) 03/22/2011   In remission   . Alcoholic cirrhosis of liver with ascites ([place]) 2014  . Dermatitis 06/10/2015  . Diarrhea 09/04/2013  . Elbow pain, left 03/17/2017  . GERD (gastroesophageal reflux disease)   . Gout   . Heart murmur   . Hx of colonic polyps   . Hypertension   . Hypertriglyceridemia 03/17/2017  . Hypopotassemia   . Otalgia 11/28/2013  . Other chronic pulmonary heart diseases   . Preventative health care 06/29/2016  . Red eye 03/03/2016   right  . Renal insufficiency 07/18/2013  . Seizures ([place])   . Sleep apnea    does not wear CPAP  . Thrombocytopenia ([place]) 06/29/2016  . Unspecified hypothyroidism 01/25/2013  . Unspecified pleural effusion     Patient Active Problem List   Diagnosis Date Noted  . Pedal edema 10/01/2017  . RUQ pain 05/06/2017  . Decreased range of motion of left elbow 04/15/2017  . Hypertriglyceridemia 03/17/2017  . Elbow pain, left  03/17/2017  . Right rib fracture   . Syncope 07/19/2016  . Thrombocytopenia ([place]) 06/29/2016  . Hoarseness, chronic 06/29/2016  . Preventative health care 06/29/2016  . Midline low back pain without sciatica 12/09/2015  . Cirrhosis with alcoholism ([place]) 08/08/2015  . Pancytopenia ([place]) 06/10/2015  . Dermatitis 06/10/2015  . OSA (obstructive sleep apnea) 02/13/2015  . Gout 07/30/2014  . Diarrhea 09/04/2013  . Chronic renal insufficiency, stage III (moderate) ([place]) 07/18/2013  . Right heart failure due to pulmonary hypertension ([place]) 06/06/2013  . Hypothyroidism 01/25/2013  . PAH (pulmonary arterial hypertension) with portal hypertension ([place]) 01/21/2013  . Allergic rhinitis 12/21/2012  . Secondary pulmonary hypertension 12/09/2012  . Hepatic cirrhosis ([place]) 11/26/2012  . GERD (gastroesophageal reflux disease) 09/05/2011  . Hypokalemia 08/29/2011  . Colon polyps 03/26/2011  . Insomnia 03/26/2011  . Hypertension 03/22/2011  . Alcohol dependence ([place]) 03/22/2011    Past Surgical History:  Procedure Laterality Date  . CARDIAC CATHETERIZATION N/A 12/21/2015   Procedure: Right Heart Cath;  Surgeon: Jolaine Artist, MD;  Location: [facility];  Service: Cardiovascular;  Laterality: N/A;  . CARDIAC CATHETERIZATION N/A 04/15/2016   Procedure: Right/Left Heart Cath and Coronary Angiography;  Surgeon: Jolaine Artist, MD;  Location: [facility];  Service: Cardiovascular;  Laterality: N/A;  . COLONOSCOPY N/A 12/08/2013   Procedure: COLONOSCOPY;  Surgeon: Milus Banister, MD;  Location: [facility];  Service: Endoscopy;  Laterality: N/A;  . ESOPHAGOGASTRODUODENOSCOPY (EGD) WITH PROPOFOL N/A 03/27/2016   Procedure: ESOPHAGOGASTRODUODENOSCOPY (EGD) WITH PROPOFOL;  Surgeon: Milus Banister, MD;  Location: [facility];  Service: Endoscopy;  Laterality: N/A;  . LOOP RECORDER INSERTION N/A 01/01/2017   Procedure: Loop Recorder Insertion;  Surgeon: Thompson Grayer, MD;  Location: [facility];  Service: Cardiovascular;  Laterality: N/A;  . RIGHT HEART CATHETERIZATION Right 06/06/2014   Procedure: RIGHT HEART CATH;  Surgeon: Jolaine Artist, MD;  Location: [facility] CATH LAB;  Service: Cardiovascular;  Laterality: Right;  . UVULOPALATOPHARYNGOPLASTY  1999       Home Medications    Prior to Admission medications   Medication Sig Start Date End Date Taking? Authorizing Provider  acetaminophen (TYLENOL) 500 MG tablet Take 500 mg by mouth every 6 (six) hours as needed.    [provider]  Albuterol Sulfate (PROAIR RESPICLICK) 868 (90 Base) MCG/ACT AEPB Inhale 2 puffs into the lungs every 6 (six) hours as needed (SOB or wheezing).    [provider]  allopurinol (ZYLOPRIM) 100 MG tablet Take 200 mg by mouth daily.    [provider]  colchicine 0.6 MG tablet TAKE 1 TABLET (0.6 MG TOTAL) BY MOUTH 2 (TWO) TIMES DAILY. 11/05/17   Mosie Lukes, MD  doxepin (SINEQUAN) 10 MG capsule Take 1-2 capsules (10-20 mg total) by mouth at bedtime as needed. 03/25/17   Mosie Lukes, MD  Fluticasone Propionate 0.05 % LOTN Apply daily to affected area 05/15/15   Mosie Lukes, MD  fluticasone-salmeterol (ADVAIR HFA) 230-21 MCG/ACT inhaler Inhale 2 puffs into the lungs every morning.    [provider]  furosemide (LASIX) 40 MG tablet Take 40 mg by mouth every other day.    [provider]  loperamide (ANTI-DIARRHEAL) 2 MG capsule Take 4 mg by mouth as needed for diarrhea or loose stools (up to 8 capsules per day).     [provider]  losartan (COZAAR) 50 MG tablet TAKE 1 TABLET DAILY 06/03/17   Larey Dresser, MD  Macitentan (OPSUMIT) 10 MG TABS Take 10 mg by mouth daily. 03/23/14   Bensimhon, Shaune Pascal, MD  Multiple Vitamin (MULTIVITAMIN WITH MINERALS) TABS tablet Take 1 tablet by mouth daily. 03/17/17   Mosie Lukes, MD  omeprazole (PRILOSEC) 40 MG capsule TAKE 1 CAPSULE DAILY 07/15/17   Mosie Lukes, MD  potassium chloride (K-DUR)  10 MEQ tablet Take 10 mEq by mouth daily.    [provider]  rosuvastatin (CRESTOR) 5 MG tablet Take 1 tablet (5 mg total) by mouth daily. 06/22/17   Mosie Lukes, MD  Selexipag (UPTRAVI) 1000 MCG TABS Take 1,000 mcg by mouth 2 (two) times daily. 10/09/17   Clegg, Amy D, NP  spironolactone (ALDACTONE) 50 MG tablet TAKE 1 TABLET DAILY 02/27/17   Mosie Lukes, MD  SYMAX-SR 0.375 MG 12 hr tablet TAKE 1 TABLET TWICE A DAY 10/05/17   Mosie Lukes, MD  tadalafil, PAH, (ADCIRCA) 20 MG tablet Take 2 tablets (40 mg total) by mouth daily. 07/14/17   Larey Dresser, MD    Family History Family History  Problem Relation Age of Onset  . Heart disease Mother   . Heart attack Mother   . Hypertension Mother   . Prostate cancer Father   . Colon cancer Neg Hx     Social History Social History   Tobacco Use  . Smoking status: Former Smoker  Packs/day: 2.00    Years: 5.00    Pack years: 10.00    Types: Cigarettes    Last attempt to quit: 09/22/1979    Years since quitting: 38.1  . Smokeless tobacco: Never Used  Substance Use Topics  . Alcohol use: Yes    Alcohol/week: 3.6 oz    Types: 6 Shots of liquor per week    Comment: 12/31/2016 " "2 vodka & tonics on MWF"  . Drug use: No     Allergies   Aspirin and Nitroglycerin   Review of Systems Review of Systems  All other systems reviewed and are negative.    Physical Exam Updated Vital Signs BP 121/70 (BP Location: Right Arm)   Pulse 99   Temp (!) 97.1 F (36.2 C) (Oral)   Resp 16   Ht 6' (1.829 m)   Wt 91.2 kg (201 lb)   SpO2 98%   BMI 27.26 kg/m   Physical Exam  Nursing note and vitals reviewed.  67 year old male, resting comfortably and in no acute distress. Vital signs are normal. Oxygen saturation is 98%, which is normal. Head is normocephalic and atraumatic. PERRLA, EOMI. Oropharynx is clear. Neck is nontender and supple without adenopathy or JVD. Back is nontender and there is no CVA  tenderness. Lungs are clear without rales, wheezes, or rhonchi. Chest is nontender. Heart has regular rate and rhythm without murmur. Abdomen is soft, flat, nontender without masses or hepatosplenomegaly and peristalsis is normoactive. Extremities have 2+ edema, full range of motion is present. Skin is warm and dry without rash. Neurologic: Mental status is normal, cranial nerves are intact, there are no motor or sensory deficits.  ED Treatments / Results  Labs (all labs ordered are listed, but only abnormal results are displayed) Labs Reviewed  ETHANOL - Abnormal; Notable for the following components:      Result Value   Alcohol, Ethyl (B) 164 (*)    All other components within normal limits  CBC WITH DIFFERENTIAL/PLATELET - Abnormal; Notable for the following components:   WBC 2.6 (*)    RBC 3.32 (*)    Hemoglobin 11.6 (*)    HCT 33.9 (*)    MCV 102.1 (*)    MCH 34.9 (*)    Platelets 43 (*)    Neutro Abs 1.3 (*)    All other components within normal limits  COMPREHENSIVE METABOLIC PANEL - Abnormal; Notable for the following components:   Sodium 134 (*)    Potassium 3.4 (*)    CO2 18 (*)    Glucose, Bld 102 (*)    BUN 35 (*)    Creatinine, Ser 2.09 (*)    AST 65 (*)    Alkaline Phosphatase 171 (*)    Total Bilirubin 2.2 (*)    GFR calc non Af Amer 31 (*)    GFR calc Af Amer 36 (*)    All other components within normal limits  I-STAT CG4 LACTIC ACID, ED - Abnormal; Notable for the following components:   Lactic Acid, Venous 2.14 (*)    All other components within normal limits  I-STAT CG4 LACTIC ACID, ED - Abnormal; Notable for the following components:   Lactic Acid, Venous 2.45 (*)    All other components within normal limits  URINALYSIS, ROUTINE W REFLEX MICROSCOPIC  CK  RAPID URINE DRUG SCREEN, HOSP PERFORMED    EKG  EKG Interpretation  Date/Time:  Wednesday November 18 2017 02:54:40 EST Ventricular Rate:  94 PR Interval:  QRS Duration: 103 QT  Interval:  382 QTC Calculation: 478 R Axis:   120 Text Interpretation:  Sinus rhythm Prolonged PR interval Probable left atrial enlargement ST or T wave suggestive of ischemia When compared with ECG of 05/06/2017, No significant change was found Confirmed by Delora Fuel ([id number]) on 11/18/2017 3:05:08 AM       EKG Interpretation  Date/Time:  Wednesday November 18 2017 03:48:01 EST Ventricular Rate:  91 PR Interval:    QRS Duration: 104 QT Interval:  404 QTC Calculation: 498 R Axis:   113 Text Interpretation:  Sinus rhythm Prolonged PR interval Probable left atrial enlargement ST or T wave suggestive of ischemia When compared with ECG of EARLIER SAME DATE No significant change was found Confirmed by Delora Fuel ([id number]) on 11/18/2017 3:50:45 AM       Radiology Ct Head Wo Contrast  Result Date: 11/18/2017 CLINICAL DATA:  67 y/o M; 1 minute of seizure-like activity. History of seizures. EXAM: CT HEAD WITHOUT CONTRAST TECHNIQUE: Contiguous axial images were obtained from the base of the skull through the vertex without intravenous contrast. COMPARISON:  06/04/2016 CT head FINDINGS: Brain: No evidence of acute infarction, hemorrhage, hydrocephalus, extra-axial collection or mass lesion/mass effect. Stable mild chronic microvascular ischemic changes and parenchymal volume loss of the brain. Small chronic lacunar infarct in left thalamus. Vascular: Mild calcific atherosclerosis of carotid siphons. No hyperdense vessel. Skull: Stable left superolateral orbital rim sclerotic focus compatible with benign etiology. No acute abnormality. Sinuses/Orbits: No acute finding. Other: None. IMPRESSION: 1. No acute intracranial abnormality identified. 2. Stable mild chronic microvascular ischemic changes and mild parenchymal volume loss of the brain. Electronically Signed   By: Kristine Garbe M.D.   On: 11/18/2017 03:24    Procedures Procedures (including critical care time)  Medications Ordered in  ED Medications  levETIRAcetam (KEPPRA) 1,000 mg in sodium chloride 0.9 % 100 mL IVPB (not administered)     Initial Impression / Assessment and Plan / ED Course  I have reviewed the triage vital signs and the nursing notes.  Pertinent labs & imaging results that were available during my care of the patient were reviewed by me and considered in my medical decision making (see chart for details).  Seizure in patient with apparent history of prior seizure but not on any anticonvulsants.  Old records are reviewed, and I found an ED visit in 2012 for new onset seizure.  He was referred to neurology and was started on levetiracetam.  However, an office visit later that year that did not include levetiracetam on his medication list.  He is not tremulous at all, so I do not believe that this is an alcohol withdrawal seizure.  He is sent for CT of head and screening labs are obtained.  He is started on levetiracetam and is given a loading dose intravenously.  Of note, he has had for hospitalizations for syncope.  In retrospect, I wonder if some of those had been seizures.  ED workup was unremarkable.  Pancytopenia is present and unchanged from prior values.  Elevation of liver enzymes is also in the same range it has been before.  Renal insufficiency is unchanged from baseline.  I have informed the patient that he should not consume any alcohol whatsoever given his underlying cirrhosis.  He is discharged with prescription for levetiracetam and is referred to neurology for follow-up.  Final Clinical Impressions(s) / ED Diagnoses   Final diagnoses:  Seizure ([place])  Alcoholic cirrhosis, unspecified whether ascites present ([place])  Renal insufficiency  Pancytopenia [facility])    ED Discharge Orders        Ordered    levETIRAcetam (KEPPRA) 500 MG tablet  2 times daily     17/35/67 0141       Delora Fuel, MD 01/01/30 [phone]

## 2017-11-20 ENCOUNTER — Ambulatory Visit (INDEPENDENT_AMBULATORY_CARE_PROVIDER_SITE_OTHER): Payer: Medicare Other | Admitting: Family

## 2017-11-20 ENCOUNTER — Telehealth: Payer: Self-pay | Admitting: Oncology

## 2017-11-20 ENCOUNTER — Inpatient Hospital Stay: Payer: Medicare Other

## 2017-11-20 ENCOUNTER — Inpatient Hospital Stay: Payer: Medicare Other | Attending: Oncology | Admitting: Oncology

## 2017-11-20 ENCOUNTER — Encounter: Payer: Self-pay | Admitting: Family

## 2017-11-20 VITALS — BP 127/77 | HR 108 | Temp 97.5°F | Resp 18 | Ht 72.0 in | Wt 206.5 lb

## 2017-11-20 VITALS — BP 126/77 | HR 96 | Temp 97.8°F | Resp 16 | Ht 72.0 in | Wt 205.8 lb

## 2017-11-20 DIAGNOSIS — K7030 Alcoholic cirrhosis of liver without ascites: Secondary | ICD-10-CM | POA: Diagnosis present

## 2017-11-20 DIAGNOSIS — N289 Disorder of kidney and ureter, unspecified: Secondary | ICD-10-CM

## 2017-11-20 DIAGNOSIS — Z5181 Encounter for therapeutic drug level monitoring: Secondary | ICD-10-CM

## 2017-11-20 DIAGNOSIS — R55 Syncope and collapse: Secondary | ICD-10-CM | POA: Insufficient documentation

## 2017-11-20 DIAGNOSIS — Z23 Encounter for immunization: Secondary | ICD-10-CM

## 2017-11-20 DIAGNOSIS — F1010 Alcohol abuse, uncomplicated: Secondary | ICD-10-CM | POA: Diagnosis present

## 2017-11-20 DIAGNOSIS — G40909 Epilepsy, unspecified, not intractable, without status epilepticus: Secondary | ICD-10-CM

## 2017-11-20 DIAGNOSIS — D696 Thrombocytopenia, unspecified: Secondary | ICD-10-CM

## 2017-11-20 LAB — CBC WITH DIFFERENTIAL/PLATELET
BASOS ABS: 0 10*3/uL (ref 0.0–0.1)
Basophils Relative: 0 %
EOS ABS: 0 10*3/uL (ref 0.0–0.5)
Eosinophils Relative: 1 %
HCT: 34.3 % — ABNORMAL LOW (ref 38.4–49.9)
HEMOGLOBIN: 11.3 g/dL — AB (ref 13.0–17.1)
LYMPHS ABS: 0.6 10*3/uL — AB (ref 0.9–3.3)
LYMPHS PCT: 35 %
MCH: 35 pg — ABNORMAL HIGH (ref 27.2–33.4)
MCHC: 32.9 g/dL (ref 32.0–36.0)
MCV: 106.5 fL — ABNORMAL HIGH (ref 79.3–98.0)
Monocytes Absolute: 0.3 10*3/uL (ref 0.1–0.9)
Monocytes Relative: 16 %
NEUTROS PCT: 48 %
Neutro Abs: 0.8 10*3/uL — ABNORMAL LOW (ref 1.5–6.5)
PLATELETS: 34 10*3/uL — AB (ref 140–400)
RBC: 3.22 MIL/uL — AB (ref 4.20–5.82)
RDW: 15.9 % — ABNORMAL HIGH (ref 11.0–15.6)
WBC: 1.7 10*3/uL — AB (ref 4.0–10.3)

## 2017-11-20 MED FILL — levETIRAcetam 500 MG TABS: 500 | 30 days supply | Qty: 60 | Fill #0

## 2017-11-20 NOTE — Patient Instructions (Signed)
Please start [place]. Schedule lab visit in 1 week for blood work. No driving per [place] driving laws until 6 months seizure free. Please avoid alcohol completely as this can increase your risk of seizure and hurt your liver.

## 2017-11-20 NOTE — Progress Notes (Signed)
HPI  Mr. Stines is a 67 yr old male who presents today for hospital follow up. He was seen in the emergency department on November 18, 2017 following a witnessed seizure at home. The seizure was apparently witnessed by family member. Emergency department note is reviewed in epic. He has previous history of alcohol dependence with alcoholic cirrhosis. He apparently drank 3 beers on the night of the seizure. He was started on Keppra in the emergency department with an IV loading dose. A CT of the head was performed during his ED visit which showed no acute intracranial abnormality. His lab work was significant for some mild hyponatremia with a sodium of 134, mild hypokalemia with potassium of 3.4. Creatinine was at baseline at 2.09. AST remains mildly elevated. He is currently scheduled for a new patient appointment with Dr. Delice Lesch (neurology) in March.  He reports that he did not start the [place]. He wanted to discuss this with Dr. Charlett Blake and Dr. Sung Amabile first. He denies any further seizure-like activity since returning home.  Review of Systems  see HPI      Past Medical History:  Diagnosis Date  . Alcohol dependence ([place]) 03/22/2011   In remission   . Alcohol dependence in remission ([place]) 03/22/2011   In remission   . Alcoholic cirrhosis of liver with ascites ([place]) 2014  . Dermatitis 06/10/2015  . Diarrhea 09/04/2013  . Elbow pain, left 03/17/2017  . GERD (gastroesophageal reflux disease)   . Gout   . Heart murmur   . Hx of colonic polyps   . Hypertension   . Hypertriglyceridemia 03/17/2017  . Hypopotassemia   . Otalgia 11/28/2013  . Other chronic pulmonary heart diseases   . Preventative health care 06/29/2016  . Red eye 03/03/2016   right  . Renal insufficiency 07/18/2013  . Seizures ([place])   . Sleep apnea    does not wear CPAP  . Thrombocytopenia ([place]) 06/29/2016  . Unspecified hypothyroidism 01/25/2013  . Unspecified pleural effusion    Social History        Socioeconomic History  .  Marital status: Divorced    Spouse name: Not on file  . Number of children: 3  . Years of education: Not on file  . Highest education level: Not on file  Social Needs  . Financial resource strain: Not on file  . Food insecurity - worry: Not on file  . Food insecurity - inability: Not on file  . Transportation needs - medical: Not on file  . Transportation needs - non-medical: Not on file  Occupational History  . Occupation: Retired    Comment: Social research officer, government  Tobacco Use  . Smoking status: Former Smoker    Packs/day: 2.00    Years: 5.00    Pack years: 10.00    Types: Cigarettes    Last attempt to quit: 09/22/1979    Years since quitting: 38.1  . Smokeless tobacco: Never Used  Substance and Sexual Activity  . Alcohol use: Yes    Alcohol/week: 3.6 oz    Types: 6 Shots of liquor per week    Comment: 12/31/2016 " "2 vodka & tonics on MWF"  . Drug use: No  . Sexual activity: Not Currently    Birth control/protection: Spermicide  Other Topics Concern  . Not on file  Social History Narrative   0 caffeine drinks daily         Past Surgical History:  Procedure Laterality Date  . CARDIAC CATHETERIZATION N/A 12/21/2015   Procedure: Right  Heart Cath; Surgeon: Jolaine Artist, MD; Location: [facility]; Service: Cardiovascular; Laterality: N/A;  . CARDIAC CATHETERIZATION N/A 04/15/2016   Procedure: Right/Left Heart Cath and Coronary Angiography; Surgeon: Jolaine Artist, MD; Location: [facility]; Service: Cardiovascular; Laterality: N/A;  . COLONOSCOPY N/A 12/08/2013   Procedure: COLONOSCOPY; Surgeon: Milus Banister, MD; Location: [facility]; Service: Endoscopy; Laterality: N/A;  . ESOPHAGOGASTRODUODENOSCOPY (EGD) WITH PROPOFOL N/A 03/27/2016   Procedure: ESOPHAGOGASTRODUODENOSCOPY (EGD) WITH PROPOFOL; Surgeon: Milus Banister, MD; Location: [facility]; Service: Endoscopy; Laterality: N/A;  . LOOP RECORDER INSERTION N/A 01/01/2017   Procedure: Loop Recorder Insertion;  Surgeon: Thompson Grayer, MD; Location: [facility]; Service: Cardiovascular; Laterality: N/A;  . RIGHT HEART CATHETERIZATION Right 06/06/2014   Procedure: RIGHT HEART CATH; Surgeon: Jolaine Artist, MD; Location: [facility]; Service: Cardiovascular; Laterality: Right;  . UVULOPALATOPHARYNGOPLASTY  1999        Family History  Problem Relation Age of Onset  . Heart disease Mother   . Heart attack Mother   . Hypertension Mother   . Prostate cancer Father   . Colon cancer Neg Hx         Allergies  Allergen Reactions  . Aspirin Other (See Comments)    hypertension  . Nitroglycerin     Headache         Current Outpatient Medications on File Prior to Visit  Medication Sig Dispense Refill  . Albuterol Sulfate (PROAIR RESPICLICK) 578 (90 Base) MCG/ACT AEPB Inhale 2 puffs into the lungs every 6 (six) hours as needed (SOB or wheezing).    Marland Kitchen allopurinol (ZYLOPRIM) 100 MG tablet Take 200 mg by mouth daily.    . colchicine 0.6 MG tablet TAKE 1 TABLET (0.6 MG TOTAL) BY MOUTH 2 (TWO) TIMES DAILY. 20 tablet 1  . fluticasone-salmeterol (ADVAIR HFA) 230-21 MCG/ACT inhaler Inhale 2 puffs into the lungs every morning.    . furosemide (LASIX) 40 MG tablet Take 80 mg by mouth daily.     Marland Kitchen levETIRAcetam (KEPPRA) 500 MG tablet Take 1 tablet (500 mg total) by mouth 2 (two) times daily. 60 tablet 0  . losartan (COZAAR) 50 MG tablet TAKE 1 TABLET DAILY 90 tablet 2  . Macitentan (OPSUMIT) 10 MG TABS Take 10 mg by mouth daily. 90 tablet 3  . omeprazole (PRILOSEC) 40 MG capsule TAKE 1 CAPSULE DAILY 90 capsule 2  . potassium chloride (K-DUR) 10 MEQ tablet Take 20 mEq by mouth daily.     . rosuvastatin (CRESTOR) 5 MG tablet Take 1 tablet (5 mg total) by mouth daily. 30 tablet 5  . Selexipag (UPTRAVI) 1000 MCG TABS Take 1,000 mcg by mouth 2 (two) times daily. 60 tablet 11  . spironolactone (ALDACTONE) 50 MG tablet TAKE 1 TABLET DAILY 90 tablet 1  . tadalafil, PAH, (ADCIRCA) 20 MG tablet Take 2 tablets (40  mg total) by mouth daily. 60 tablet 11   No current facility-administered medications on file prior to visit.    BP 126/77 (BP Location: Right Arm, Patient Position: Sitting, Cuff Size: Normal)  Pulse 96  Temp 97.8 F (36.6 C) (Oral)  Resp 16  Ht 6' (1.829 m)  Wt 205 lb 12.8 oz (93.4 kg)  SpO2 95%  BMI 27.91 kg/m  Objective:  Physical Exam  Constitutional: He is oriented to person, place, and time. He appears well-developed and well-nourished. No distress.  HENT:  Head: Normocephalic and atraumatic.  Cardiovascular: Normal rate and regular rhythm.  No murmur heard.  Pulmonary/Chest: Effort normal and breath sounds normal. No respiratory distress. He has no wheezes. He has no rales.  Musculoskeletal: He exhibits no edema.  Neurological: He is alert and oriented to person, place, and time. He exhibits normal muscle tone.  Skin: Skin is warm and dry.  Psychiatric: He has a normal mood and affect. His behavior is normal. Thought content normal.   Assessment & Plan:  Seizure-no further seizure-like activity since returning home. I have advised patient that I think it would be a good idea for him to start the [place] . I will forward this information to his primary care provider as well as his cardiologist. I have advised him that he will need to return to the lab in 1 week so that we can drop follow-up lab work including a Keppra level. Discussed that alcohol use can decrease the seizure threshold and therefore increase his risk of seizures in the future. He also has a history of cirrhosis of the liver. I have advised him to avoid alcohol completely. I have also advised him that were [place] driving wise he should not drive until he is 6 months seizure-free.

## 2017-11-20 NOTE — Progress Notes (Signed)
Hematology and Oncology Follow Up Visit  Darren Mueller [id number] 26-Sep-1951 67 y.o. 11/20/2017 9:25 AM Darren Mueller, MDBlyth, Darren Levan, MD   Principle Diagnosis: 67 year old with:  1. Thrombocytopenia and a leukocytopenia diagnosed in 2012. His baseline white cell count is 1.6 with a platelet count of 35,000.  These findings are related to hypersplenism related to cirrhosis of the liver.  2.  Cirrhosis of the liver: Related to alcohol consumption.  Current therapy: Observation and surveillance.  Interim History:  Darren Mueller for a follow-up visit by himself.  Since the last visit he reported an episode of syncope and questionable seizure that required evaluation in the emergency department on November 18, 2017.  At that time, CT scan of the head showed no acute abnormalities.  He has felt well since that time and has not taking Keppra that was prescribed to him.  He denies any active bleeding at this time.  He denies any hemoptysis, hematuria emesis, hematochezia or melena.  He denies any chest pain or difficulty breathing.  He is currently undergoing cardiac and neurological evaluation for this episode.  He does not report any headaches, blurry vision, syncope or seizures. He does not report any fevers, chills or sweats. He does not report any cough, wheezing or hemoptysis. He does not report any chest pain, palpitation or orthopnea. Does not report any nausea, vomiting, abdominal pain, or early satiety. He does not report any constipation or diarrhea. He does not report any frequency urgency or hesitancy. He does not report any skeletal complaints.  Remaining review of system is negative.  Medications: I have reviewed the patient's current medications.  Current Outpatient Medications  Medication Sig Dispense Refill  . Albuterol Sulfate (PROAIR RESPICLICK) 166 (90 Base) MCG/ACT AEPB Inhale 2 puffs into the lungs every 6 (six) hours as needed (SOB or wheezing).    Marland Kitchen allopurinol (ZYLOPRIM)  100 MG tablet Take 200 mg by mouth daily.    . colchicine 0.6 MG tablet TAKE 1 TABLET (0.6 MG TOTAL) BY MOUTH 2 (TWO) TIMES DAILY. 20 tablet 1  . fluticasone-salmeterol (ADVAIR HFA) 230-21 MCG/ACT inhaler Inhale 2 puffs into the lungs every morning.    . furosemide (LASIX) 40 MG tablet Take 80 mg by mouth daily.     Marland Kitchen levETIRAcetam (KEPPRA) 500 MG tablet Take 1 tablet (500 mg total) by mouth 2 (two) times daily. (Patient not taking: Reported on 11/18/2017) 60 tablet 0  . losartan (COZAAR) 50 MG tablet TAKE 1 TABLET DAILY 90 tablet 2  . Macitentan (OPSUMIT) 10 MG TABS Take 10 mg by mouth daily. 90 tablet 3  . omeprazole (PRILOSEC) 40 MG capsule TAKE 1 CAPSULE DAILY 90 capsule 2  . potassium chloride (K-DUR) 10 MEQ tablet Take 20 mEq by mouth daily.     . rosuvastatin (CRESTOR) 5 MG tablet Take 1 tablet (5 mg total) by mouth daily. 30 tablet 5  . Selexipag (UPTRAVI) 1000 MCG TABS Take 1,000 mcg by mouth 2 (two) times daily. 60 tablet 11  . spironolactone (ALDACTONE) 50 MG tablet TAKE 1 TABLET DAILY 90 tablet 1  . tadalafil, PAH, (ADCIRCA) 20 MG tablet Take 2 tablets (40 mg total) by mouth daily. 60 tablet 11   No current facility-administered medications for this visit.      Allergies:  Allergies  Allergen Reactions  . Aspirin Other (See Comments)    hypertension  . Nitroglycerin     Headache    Past Medical History, Surgical history, Social history, and Family History  were reviewed and updated.   Physical Exam: Blood pressure 127/77, pulse (!) 108, temperature (!) 97.5 F (36.4 C), temperature source Oral, resp. rate 18, height 6' (1.829 m), weight 206 lb 8 oz (93.7 kg), SpO2 99 %. ECOG: 1 General appearance: Well-appearing gentleman appeared comfortable. Head: Normocephalic, without obvious abnormality.  Oral mucosa: No oral lesions or bleeding. Lymph nodes: Cervical, supraclavicular, and axillary nodes normal. Heart:regular rate and rhythm, S1, S2 normal, no murmur, click, rub  or gallop Lung:chest clear, no wheezing, rales, normal symmetric air entry.  Abdomin: soft, non-tender, without masses or organomegaly no shifting dullness or ascites. Musculoskeletal: No joint effusion or tenderness.  No deformities noted. Neurological: No deficits noted.   Lab Results: Lab Results  Component Value Date   WBC 1.7 (L) 11/20/2017   HGB 11.3 (L) 11/20/2017   HCT 34.3 (L) 11/20/2017   MCV 106.5 (H) 11/20/2017   PLT 34 (L) 11/20/2017     Chemistry      Component Value Date/Time   NA 134 (L) 11/18/2017 0252   NA 140 06/15/2015 1414   K 3.4 (L) 11/18/2017 0252   K 3.6 06/15/2015 1414   CL 104 11/18/2017 0252   CL 106 06/15/2015 1414   CO2 18 (L) 11/18/2017 0252   CO2 17 (L) 06/15/2015 1414   BUN 35 (H) 11/18/2017 0252   BUN 18 06/15/2015 1414   CREATININE 2.09 (H) 11/18/2017 0252   CREATININE 1.63 (H) 09/17/2017 1614      Component Value Date/Time   CALCIUM 9.0 11/18/2017 0252   CALCIUM 9.5 06/15/2015 1414   ALKPHOS 171 (H) 11/18/2017 0252   ALKPHOS 145 (H) 06/15/2015 1414   AST 65 (H) 11/18/2017 0252   AST 68 (H) 06/15/2015 1414   ALT 23 11/18/2017 0252   ALT 35 06/15/2015 1414   BILITOT 2.2 (H) 11/18/2017 0252   BILITOT 1.40 06/15/2015 1414       Impression and Plan:  67 year old gentleman with the following issues:  1. Thrombocytopenia and leukocytopenia: CBC from today was personally reviewed and compared to previous counts.  He continues to be close to baseline without any major changes.  His findings are related to cirrhosis of the liver and hypersplenism.  I see no role for any transfusion or growth factor support at this time.  I recommend continue monitoring and supportive care as needed.  2. Cirrhosis of the liver: Related to alcohol abuse.  He continues to drink periodically despite recommendation to stop.  Continue to educate him about cutting down on drinking as much as possible.  3.  Syncope: Questionable seizure activity.  He is  currently under evaluation for this episode.  Could be related to alcohol intake versus cardiac etiology.  4. Follow-up: Will be in 8 months sooner if needed to.   15 minutes was spent with the patient face-to-face today.  More than 50% of time was dedicated to patient counseling, education and coordination of his care.    Zola Button, MD 1/18/20199:25 AM

## 2017-11-20 NOTE — Telephone Encounter (Signed)
Gave avs and calendar or September

## 2017-11-25 ENCOUNTER — Encounter (HOSPITAL_COMMUNITY): Payer: Medicare Other | Admitting: Internal Medicine

## 2017-11-27 ENCOUNTER — Ambulatory Visit (INDEPENDENT_AMBULATORY_CARE_PROVIDER_SITE_OTHER): Payer: Medicare Other | Admitting: *Deleted

## 2017-11-27 ENCOUNTER — Other Ambulatory Visit (INDEPENDENT_AMBULATORY_CARE_PROVIDER_SITE_OTHER): Payer: Medicare Other

## 2017-11-27 DIAGNOSIS — Z5181 Encounter for therapeutic drug level monitoring: Secondary | ICD-10-CM | POA: Diagnosis not present

## 2017-11-27 DIAGNOSIS — R55 Syncope and collapse: Secondary | ICD-10-CM

## 2017-11-27 LAB — COMPREHENSIVE METABOLIC PANEL
ALK PHOS: 147 U/L — AB (ref 39–117)
ALT: 18 U/L (ref 0–53)
AST: 44 U/L — ABNORMAL HIGH (ref 0–37)
Albumin: 4.1 g/dL (ref 3.5–5.2)
BUN: 25 mg/dL — ABNORMAL HIGH (ref 6–23)
CO2: 22 meq/L (ref 19–32)
Calcium: 9.4 mg/dL (ref 8.4–10.5)
Chloride: 105 mEq/L (ref 96–112)
Creatinine, Ser: 1.71 mg/dL — ABNORMAL HIGH (ref 0.40–1.50)
GFR: 51.48 mL/min — AB (ref >60.00)
GLUCOSE: 115 mg/dL — AB (ref 70–99)
POTASSIUM: 4.6 meq/L (ref 3.5–5.1)
Sodium: 136 mEq/L (ref 135–145)
TOTAL PROTEIN: 7.6 g/dL (ref 6.0–8.3)
Total Bilirubin: 1.3 mg/dL — ABNORMAL HIGH (ref 0.2–1.2)

## 2017-11-27 MED FILL — COLCHICINE 0.6 MG TABS: 0.6 | 10 days supply | Qty: 20 | Fill #1

## 2017-11-30 NOTE — Progress Notes (Signed)
Carelink Summary Report / Loop Recorder 

## 2017-12-02 LAB — LEVETIRACETAM LEVEL: KEPPRA (LEVETIRACETAM): 23 ug/mL

## 2017-12-04 DIAGNOSIS — I700 Atherosclerosis of aorta: Secondary | ICD-10-CM

## 2017-12-04 HISTORY — DX: Atherosclerosis of aorta: I70.0

## 2017-12-08 LAB — CUP PACEART REMOTE DEVICE CHECK
Date Time Interrogation Session: 20190125213912
MDC IDC PG IMPLANT DT: 20180301

## 2017-12-08 MED FILL — TADALAFIL (PAH) 20 MG TABS: 20 | 30 days supply | Qty: 60 | Fill #7

## 2017-12-08 MED FILL — ROSUVASTATIN CALCIUM 5 MG T: 5 | 30 days supply | Qty: 30 | Fill #4

## 2017-12-11 ENCOUNTER — Other Ambulatory Visit: Payer: Self-pay | Admitting: Internal Medicine

## 2017-12-14 ENCOUNTER — Encounter: Payer: Self-pay | Admitting: Medical

## 2017-12-14 ENCOUNTER — Ambulatory Visit (HOSPITAL_BASED_OUTPATIENT_CLINIC_OR_DEPARTMENT_OTHER)
Admission: RE | Admit: 2017-12-14 | Discharge: 2017-12-14 | Disposition: A | Discharge status: Home / Self Care | Payer: Medicare Other | Source: Ambulatory Visit | Attending: Medical | Admitting: Medical

## 2017-12-14 ENCOUNTER — Other Ambulatory Visit: Payer: Medicare Other

## 2017-12-14 ENCOUNTER — Ambulatory Visit (INDEPENDENT_AMBULATORY_CARE_PROVIDER_SITE_OTHER): Payer: Medicare Other | Admitting: Medical

## 2017-12-14 VITALS — BP 111/64 | HR 124 | Temp 98.5°F | Resp 16 | Ht 72.0 in | Wt 200.2 lb

## 2017-12-14 DIAGNOSIS — J111 Influenza due to unidentified influenza virus with other respiratory manifestations: Secondary | ICD-10-CM | POA: Diagnosis not present

## 2017-12-14 DIAGNOSIS — R059 Cough, unspecified: Secondary | ICD-10-CM

## 2017-12-14 DIAGNOSIS — R05 Cough: Secondary | ICD-10-CM | POA: Insufficient documentation

## 2017-12-14 DIAGNOSIS — I700 Atherosclerosis of aorta: Secondary | ICD-10-CM | POA: Diagnosis not present

## 2017-12-14 DIAGNOSIS — R739 Hyperglycemia, unspecified: Secondary | ICD-10-CM | POA: Diagnosis not present

## 2017-12-14 DIAGNOSIS — J40 Bronchitis, not specified as acute or chronic: Secondary | ICD-10-CM

## 2017-12-14 DIAGNOSIS — R0602 Shortness of breath: Secondary | ICD-10-CM | POA: Diagnosis not present

## 2017-12-14 DIAGNOSIS — R0600 Dyspnea, unspecified: Secondary | ICD-10-CM | POA: Diagnosis not present

## 2017-12-14 DIAGNOSIS — I517 Cardiomegaly: Secondary | ICD-10-CM | POA: Insufficient documentation

## 2017-12-14 LAB — CBC WITH DIFFERENTIAL/PLATELET
Basophils Absolute: 10 cells/uL (ref 0–200)
Basophils Relative: 0.3 %
Eosinophils Absolute: 19 cells/uL (ref 15–500)
Eosinophils Relative: 0.6 %
HEMATOCRIT: 35.7 % — AB (ref 38.5–50.0)
Hemoglobin: 12.3 g/dL — ABNORMAL LOW (ref 13.2–17.1)
Lymphs Abs: 573 cells/uL — ABNORMAL LOW (ref 850–3900)
MCH: 35.3 pg — ABNORMAL HIGH (ref 27.0–33.0)
MCHC: 34.5 g/dL (ref 32.0–36.0)
MCV: 102.6 fL — ABNORMAL HIGH (ref 80.0–100.0)
MONOS PCT: 11.3 %
Neutro Abs: 2237 cells/uL (ref 1500–7800)
Neutrophils Relative %: 69.9 %
PLATELETS: 25 10*3/uL — AB (ref 140–400)
RBC: 3.48 10*6/uL — AB (ref 4.20–5.80)
RDW: 13.3 % (ref 11.0–15.0)
TOTAL LYMPHOCYTE: 17.9 %
WBC: 3.2 10*3/uL — ABNORMAL LOW (ref 3.8–10.8)
WBCMIX: 362 {cells}/uL (ref 200–950)

## 2017-12-14 LAB — POCT INFLUENZA A/B
INFLUENZA B, POC: NEGATIVE
Influenza A, POC: POSITIVE — AB

## 2017-12-14 LAB — HEMOGLOBIN A1C: Hgb A1c MFr Bld: 5 % (ref 4.6–6.5)

## 2017-12-14 LAB — BRAIN NATRIURETIC PEPTIDE: Brain Natriuretic Peptide: 1337 pg/mL — ABNORMAL HIGH (ref <100)

## 2017-12-14 MED ORDER — IPRATROPIUM BROMIDE HFA 17 MCG/ACT IN AERS: 2.0000 | INHALATION_SPRAY | Freq: Four times a day (QID) | RESPIRATORY_TRACT | 12 refills | Status: DC | PRN

## 2017-12-14 MED ORDER — OSELTAMIVIR PHOSPHATE 75 MG PO CAPS: 75.0000 mg | ORAL_CAPSULE | Freq: Two times a day (BID) | ORAL | 0 refills | Status: DC

## 2017-12-14 MED ORDER — BENZONATATE 200 MG PO CAPS: 200.0000 mg | ORAL_CAPSULE | Freq: Three times a day (TID) | ORAL | 0 refills | Status: DC | PRN

## 2017-12-14 MED ORDER — DOXYCYCLINE HYCLATE 100 MG PO TABS: 100.0000 mg | ORAL_TABLET | Freq: Two times a day (BID) | ORAL | 0 refills | Status: DC

## 2017-12-14 MED FILL — ATROVENT HFA INHALER: 17 | 25 days supply | Qty: 13 | Fill #0

## 2017-12-14 MED FILL — BENZONATATE 200 MG CAP: 200 | 10 days supply | Qty: 30 | Fill #0

## 2017-12-14 MED FILL — OSELTAMIVIR PHOSPHATE 75 MG: 75 | 5 days supply | Qty: 10 | Fill #0

## 2017-12-14 MED FILL — DOXYCYCLINE HYCLATE 100 MG: 100 | 10 days supply | Qty: 20 | Fill #0

## 2017-12-14 NOTE — Progress Notes (Signed)
Subjective:    Patient ID: Darren Mueller, male    DOB: 1950-12-08, 67 y.o.   MRN: [id number]  HPI  Pt in with feeling ill recently. Pt dad had flu. Pt had some contact with his dad before his dad was admitted to the hospital.   Pt states last Wednesday night he began with bodyaches, fevers, and severe cough. Pt cough is dry. He is not bringing up any mucus.   Pt does have some shortness of breath. He has history of CHF.   Pt is on advair. Advised continue to use. He also has proair available.     Review of Systems  Constitutional: Negative for chills, fatigue and fever.  HENT: Positive for congestion. Negative for hearing loss, rhinorrhea, sinus pressure and sinus pain.   Respiratory: Positive for cough, shortness of breath and wheezing. Negative for choking.   Cardiovascular: Negative for chest pain and palpitations.  Gastrointestinal: Negative for abdominal pain.  Musculoskeletal: Negative for back pain.  Skin: Negative for rash.  Neurological: Negative for dizziness and headaches.  Hematological: Negative for adenopathy. Does not bruise/bleed easily.  Psychiatric/Behavioral: Negative for decreased concentration. The patient is not nervous/anxious.        Objective:   Physical Exam  General  Mental Status - Alert. General Appearance - Well groomed. Not in acute distress.  Skin Rashes- No Rashes.  HEENT Head- Normal. Ear Auditory Canal - Left- Normal. Right - Normal.Tympanic Membrane- Left- Normal. Right- Normal. Eye Sclera/Conjunctiva- Left- Normal. Right- Normal. Nose & Sinuses Nasal Mucosa- Left-  Boggy and Congested. Right-  Boggy and  Congested.Bilateral maxillary and frontal sinus pressure. Mouth & Throat Lips: Upper Lip- Normal: no dryness, cracking, pallor, cyanosis, or vesicular eruption. Lower Lip-Normal: no dryness, cracking, pallor, cyanosis or vesicular eruption. Buccal Mucosa- Bilateral- No Aphthous ulcers. Oropharynx- No Discharge or  Erythema. Tonsils: Characteristics- Bilateral- No Erythema or Congestion. Size/Enlargement- Bilateral- No enlargement. Discharge- bilateral-None.  Neck Neck- Supple. No Masses.   Chest and Lung Exam Auscultation: Breath Sounds:-Clear even and unlabored.  Cardiovascular Auscultation:Rythm- Regular, rate and rhythm. Murmurs & Other Heart Sounds:Ausculatation of the heart reveal- No Murmurs.  Lymphatic Head & Neck General Head & Neck Lymphatics: Bilateral: Description- No Localized lymphadenopathy.  Lower extremities- no significant pedal edema presently on either side.,  Negative Homans sign.     Assessment & Plan:  You do have the flu flulike symptoms and your test came back positive.  We will go ahead and give you Tamiflu medication.  I do want you to be aware that you have past the ideal treatment timeframe for the flu but with your age and medical history is still might be worth for you to start the medication.  Some concern for early secondary bacterial infection type symptoms.  Presently concern for bronchitis but would like to get a chest x-ray to make sure no pneumonia present.  I am prescribing doxycycline antibiotic and benzonatate for your cough.  You have some shortness of breath recently above your normal baseline.  I want you to continue Advair and use pro-air if needed.  With chest x-ray today will differentiate if you have any pneumonia and evaluate if she has any flare of your CHF.  Will also get labs CBC, BNP and A1c.  He has a history of mild high sugars and I think getting a 40-monthsugar average would be beneficial presently.  Particularly in the event you have worse shortness of breath with wheezing.  In that event might need to give  you a low taper dose of prednisone.  Follow-up in 4 days or as needed.  If any dramatic worsening of signs and symptoms after hours then recommend ED evaluation fluid can lead to severe complications.  Mackie Pai, PA-C

## 2017-12-14 NOTE — Patient Instructions (Addendum)
You do have the flu flulike symptoms and your test came back positive.  We will go ahead and give you Tamiflu medication.  I do want you to be aware that you have past the ideal treatment timeframe for the flu but with your age and medical history is still might be worth for you to start the medication.  Some concern for early secondary bacterial infection type symptoms.  Presently concern for bronchitis but would like to get a chest x-ray to make sure no pneumonia present.  I am prescribing doxycycline antibiotic and benzonatate for your cough.  You have some shortness of breath recently above your normal baseline.  I want you to continue Advair and use pro-air if needed.  Also I think it might be beneficial for you to use Atrovent inhaler.  With chest x-ray today will differentiate if you have any pneumonia and evaluate if she has any flare of your CHF.  Will also get labs CBC, BNP and A1c.  He has a history of mild high sugars and I think getting a 64-monthsugar average would be beneficial presently.  Particularly in the event you have worse shortness of breath with wheezing.  In that event might need to give you a low taper dose of prednisone.  Follow-up in 4 days or as needed.  If any dramatic worsening of signs and symptoms after hours then recommend ED evaluation fluid can lead to severe complications.

## 2017-12-18 ENCOUNTER — Encounter: Payer: Self-pay | Admitting: Medical

## 2017-12-18 ENCOUNTER — Ambulatory Visit (INDEPENDENT_AMBULATORY_CARE_PROVIDER_SITE_OTHER): Payer: Medicare Other | Admitting: Medical

## 2017-12-18 ENCOUNTER — Telehealth: Payer: Self-pay | Admitting: Medical

## 2017-12-18 ENCOUNTER — Ambulatory Visit: Payer: Medicare Other | Admitting: Medical

## 2017-12-18 VITALS — BP 91/56 | HR 85 | Temp 97.4°F | Resp 16 | Ht 72.0 in | Wt 196.0 lb

## 2017-12-18 DIAGNOSIS — J40 Bronchitis, not specified as acute or chronic: Secondary | ICD-10-CM | POA: Diagnosis not present

## 2017-12-18 DIAGNOSIS — R062 Wheezing: Secondary | ICD-10-CM

## 2017-12-18 DIAGNOSIS — Z8679 Personal history of other diseases of the circulatory system: Secondary | ICD-10-CM | POA: Diagnosis not present

## 2017-12-18 DIAGNOSIS — R5383 Other fatigue: Secondary | ICD-10-CM | POA: Diagnosis not present

## 2017-12-18 DIAGNOSIS — R42 Dizziness and giddiness: Secondary | ICD-10-CM

## 2017-12-18 DIAGNOSIS — E860 Dehydration: Secondary | ICD-10-CM | POA: Diagnosis not present

## 2017-12-18 DIAGNOSIS — J111 Influenza due to unidentified influenza virus with other respiratory manifestations: Secondary | ICD-10-CM | POA: Diagnosis not present

## 2017-12-18 LAB — POC URINALSYSI DIPSTICK (AUTOMATED)
BILIRUBIN UA: NEGATIVE
GLUCOSE UA: NEGATIVE
Ketones, UA: NEGATIVE
Leukocytes, UA: NEGATIVE
Nitrite, UA: NEGATIVE
Spec Grav, UA: >=1.03 — AB (ref 1.010–1.025)
UROBILINOGEN UA: 0.2 U/dL
pH, UA: 6 (ref 5.0–8.0)

## 2017-12-18 LAB — COMPREHENSIVE METABOLIC PANEL
ALBUMIN: 4 g/dL (ref 3.5–5.2)
ALT: 46 U/L (ref 0–53)
AST: 82 U/L — ABNORMAL HIGH (ref 0–37)
Alkaline Phosphatase: 215 U/L — ABNORMAL HIGH (ref 39–117)
BUN: 45 mg/dL — AB (ref 6–23)
CALCIUM: 8.8 mg/dL (ref 8.4–10.5)
CO2: 19 mEq/L (ref 19–32)
CREATININE: 2.45 mg/dL — AB (ref 0.40–1.50)
Chloride: 107 mEq/L (ref 96–112)
GFR: 33.99 mL/min — ABNORMAL LOW (ref >60.00)
Glucose, Bld: 109 mg/dL — ABNORMAL HIGH (ref 70–99)
POTASSIUM: 4.4 meq/L (ref 3.5–5.1)
SODIUM: 135 meq/L (ref 135–145)
TOTAL PROTEIN: 7.4 g/dL (ref 6.0–8.3)
Total Bilirubin: 1.5 mg/dL — ABNORMAL HIGH (ref 0.2–1.2)

## 2017-12-18 MED ORDER — HYDROCODONE-HOMATROPINE 5-1.5 MG/5ML PO SYRP: 5.0000 mL | ORAL_SOLUTION | Freq: Three times a day (TID) | ORAL | 0 refills | Status: DC | PRN

## 2017-12-18 MED FILL — DOXEPIN HCL 10 MG CAPS: 10 | 30 days supply | Qty: 60 | Fill #3

## 2017-12-18 MED FILL — HYDROCODONE-HOMATROPINE SOL: 5-1.5 | 7 days supply | Qty: 100 | Fill #0

## 2017-12-18 NOTE — Telephone Encounter (Signed)
Pt cr increased and gfr decreased. This does match likely dehydration form poor intake and lasix. Pt advised on holding lasix and increasing hydration. Will you call pt on Monday and see how he is doing. See how visit went with cardiologist.

## 2017-12-18 NOTE — Patient Instructions (Addendum)
You have had recent flu and are close to finishing  the Tamiflu.  Some concern for bronchitis and your last chest x-ray did not show any pneumonia.  Continue with the doxycycline.  You had recent fatigue and lightheaded sensation.  This is probably related to the flu but I do think you are dehydrated.  Although your urine did  not show any ketones.  However you are on Lasix and have not been drinking much and you have lost 4 pounds since last visit.  Discussed your recent diagnosis with Dr. Charlett Blake and we both agree that you are probably mildly dehydrated from poor intake and due to the Lasix.  Over the weekend want you to hold your Lasix and make sure you hydrate well with propel or Gatorade.  Try to drink 2 bottles a day at least.  Also try to eat more than chicken noodle soup.  I would like you to weigh yourself at home today and then also weigh yourself Monday morning.  Give [place] an update on today's weight at home and weight  on Monday.  Also if you have a blood pressure cuff at home, I want you to check your blood pressure daily over the next 3 days.  Would like update on your Monday blood pressure reading as well.  Dr. Chriss Czar discussed the possibility of sending you down to the ED for IV hydration but we do not think that is absolutely necessary provided that you hydrate well at home.  I do want to get CMP today outpatient to see if he had any electrolyte abnormalities that might need to be corrected fast through the ED.  Please continue your Advair inhaler and Atrovent.  Use pro-air if needed.  I am going to prescribe you Hycodan which is a stronger cough medicine.  Hold the doxepin presently while using Hycodan.  Also strict advised not to drink any alcohol while using hycodan.  I want you to call us on Monday with update on how you feel, your blood pressure readings and your weight. If you have worse signs and symptoms over the weekend then be seen in the emergency department.

## 2017-12-18 NOTE — Progress Notes (Signed)
Subjective:    Patient ID: Darren Mueller, male    DOB: [birth year], 67 y.o.   MRN: [id number]  HPI  Pt in for follow up.  Follow up for the flu. Had concerned for bronchitis post flu. He did not have pneumonia on chest cxr and did not have chf flare on review of labs and cxr.  CBC was not elevated. Pt not anemic.   No fever but some chills. No body aches. Some wheezing in the morning. He did not get atrovent inhaler yet. He states the pharmacy had to order the atrovent. Pt is using advair daily and has proair if neeed.  Pt states cough can be severe. Cough is keeping him up at night. Benzonatate did not stop his cough.  Pt admits has been only eating chicken noodle soup, and water. Some small amounts of ginger ale. Pt has 4 lb weight loss since last visit.  Pt has chf. He has been using lasix 80 mg every other day.       Review of Systems  Constitutional: Negative for chills and fatigue.  HENT: Negative for congestion, ear discharge, hearing loss, nosebleeds and postnasal drip.   Respiratory: Positive for cough and wheezing. Negative for chest tightness and shortness of breath.   Cardiovascular: Negative for chest pain and palpitations.  Gastrointestinal: Negative for abdominal distention, anal bleeding, blood in stool, diarrhea and nausea.  Genitourinary: Negative for dysuria and enuresis.  Musculoskeletal: Negative for back pain.  Skin: Negative for rash.  Neurological: Negative for dizziness, seizures, syncope, speech difficulty, weakness, numbness and headaches.  Hematological: Negative for adenopathy. Does not bruise/bleed easily.  Psychiatric/Behavioral: Negative for behavioral problems, decreased concentration and dysphoric mood.    Past Medical History:  Diagnosis Date  . Alcohol dependence ([place]) 03/22/2011   In remission   . Alcohol dependence in remission ([place]) 03/22/2011   In remission   . Alcoholic cirrhosis of liver with ascites ([place]) 2014  . Dermatitis  06/10/2015  . Diarrhea 09/04/2013  . Elbow pain, left 03/17/2017  . GERD (gastroesophageal reflux disease)   . Gout   . Heart murmur   . Hx of colonic polyps   . Hypertension   . Hypertriglyceridemia 03/17/2017  . Hypopotassemia   . Otalgia 11/28/2013  . Other chronic pulmonary heart diseases   . Preventative health care 06/29/2016  . Red eye 03/03/2016   right  . Renal insufficiency 07/18/2013  . Seizures ([place])   . Sleep apnea    does not wear CPAP  . Thrombocytopenia ([place]) 06/29/2016  . Unspecified hypothyroidism 01/25/2013  . Unspecified pleural effusion      Social History   Socioeconomic History  . Marital status: Divorced    Spouse name: Not on file  . Number of children: 3  . Years of education: Not on file  . Highest education level: Not on file  Social Needs  . Financial resource strain: Not on file  . Food insecurity - worry: Not on file  . Food insecurity - inability: Not on file  . Transportation needs - medical: Not on file  . Transportation needs - non-medical: Not on file  Occupational History  . Occupation: Retired    Comment: Social research officer, government  Tobacco Use  . Smoking status: Former Smoker    Packs/day: 2.00    Years: 5.00    Pack years: 10.00    Types: Cigarettes    Last attempt to quit: 09/22/1979    Years since quitting: 38.2  . Smokeless tobacco:  Never Used  Substance and Sexual Activity  . Alcohol use: Yes    Alcohol/week: 3.6 oz    Types: 6 Shots of liquor per week    Comment: 12/31/2016 " "2 vodka & tonics on MWF"  . Drug use: No  . Sexual activity: Not Currently    Birth control/protection: Spermicide  Other Topics Concern  . Not on file  Social History Narrative   0 caffeine drinks daily     Past Surgical History:  Procedure Laterality Date  . CARDIAC CATHETERIZATION N/A 12/21/2015   Procedure: Right Heart Cath;  Surgeon: Jolaine Artist, MD;  Location: [facility];  Service: Cardiovascular;  Laterality: N/A;  . CARDIAC CATHETERIZATION  N/A 04/15/2016   Procedure: Right/Left Heart Cath and Coronary Angiography;  Surgeon: Jolaine Artist, MD;  Location: [facility];  Service: Cardiovascular;  Laterality: N/A;  . COLONOSCOPY N/A 12/08/2013   Procedure: COLONOSCOPY;  Surgeon: Milus Banister, MD;  Location: [facility];  Service: Endoscopy;  Laterality: N/A;  . ESOPHAGOGASTRODUODENOSCOPY (EGD) WITH PROPOFOL N/A 03/27/2016   Procedure: ESOPHAGOGASTRODUODENOSCOPY (EGD) WITH PROPOFOL;  Surgeon: Milus Banister, MD;  Location: [facility];  Service: Endoscopy;  Laterality: N/A;  . LOOP RECORDER INSERTION N/A 01/01/2017   Procedure: Loop Recorder Insertion;  Surgeon: Thompson Grayer, MD;  Location: [facility];  Service: Cardiovascular;  Laterality: N/A;  . RIGHT HEART CATHETERIZATION Right 06/06/2014   Procedure: RIGHT HEART CATH;  Surgeon: Jolaine Artist, MD;  Location: [facility];  Service: Cardiovascular;  Laterality: Right;  . UVULOPALATOPHARYNGOPLASTY  1999    Family History  Problem Relation Age of Onset  . Heart disease Mother   . Heart attack Mother   . Hypertension Mother   . Prostate cancer Father   . Colon cancer Neg Hx     Allergies  Allergen Reactions  . Aspirin Other (See Comments)    hypertension  . Nitroglycerin     Headache    Current Outpatient Medications on File Prior to Visit  Medication Sig Dispense Refill  . Albuterol Sulfate (PROAIR RESPICLICK) 062 (90 Base) MCG/ACT AEPB Inhale 2 puffs into the lungs every 6 (six) hours as needed (SOB or wheezing).    Marland Kitchen allopurinol (ZYLOPRIM) 100 MG tablet Take 200 mg by mouth daily.    . benzonatate (TESSALON) 200 MG capsule Take 1 capsule (200 mg total) by mouth 3 (three) times daily as needed for cough. 30 capsule 0  . colchicine 0.6 MG tablet TAKE 1 TABLET (0.6 MG TOTAL) BY MOUTH 2 (TWO) TIMES DAILY. 20 tablet 1  . doxycycline (VIBRA-TABS) 100 MG tablet Take 1 tablet (100 mg total) by mouth 2 (two) times daily. Can give caps or generic 20 tablet  0  . fluticasone-salmeterol (ADVAIR HFA) 230-21 MCG/ACT inhaler Inhale 2 puffs into the lungs every morning.    . furosemide (LASIX) 40 MG tablet Take 80 mg by mouth daily.     Marland Kitchen ipratropium (ATROVENT HFA) 17 MCG/ACT inhaler Inhale 2 puffs into the lungs every 6 (six) hours as needed for wheezing. 1 Inhaler 12  . levETIRAcetam (KEPPRA) 500 MG tablet Take 1 tablet (500 mg total) by mouth 2 (two) times daily. 60 tablet 0  . losartan (COZAAR) 50 MG tablet TAKE 1 TABLET DAILY 90 tablet 2  . Macitentan (OPSUMIT) 10 MG TABS Take 10 mg by mouth daily. 90 tablet 3  . omeprazole (PRILOSEC) 40 MG capsule TAKE 1 CAPSULE DAILY 90 capsule 2  . oseltamivir (  TAMIFLU) 75 MG capsule Take 1 capsule (75 mg total) by mouth 2 (two) times daily. Pt treated for flu. 10 capsule 0  . potassium chloride (K-DUR) 10 MEQ tablet Take 20 mEq by mouth daily.     . rosuvastatin (CRESTOR) 5 MG tablet Take 1 tablet (5 mg total) by mouth daily. 30 tablet 5  . Selexipag (UPTRAVI) 1000 MCG TABS Take 1,000 mcg by mouth 2 (two) times daily. 60 tablet 11  . spironolactone (ALDACTONE) 50 MG tablet TAKE 1 TABLET DAILY 90 tablet 1  . tadalafil, PAH, (ADCIRCA) 20 MG tablet Take 2 tablets (40 mg total) by mouth daily. 60 tablet 11   No current facility-administered medications on file prior to visit.     BP (!) 91/56 (BP Location: Right Arm, Patient Position: Sitting, Cuff Size: Large)   Pulse 85   Temp (!) 97.4 F (36.3 C) (Oral)   Resp 16   Ht 6' (1.829 m)   Wt 196 lb (88.9 kg)   SpO2 100%   BMI 26.58 kg/m       Objective:   Physical Exam  General  Mental Status - Alert. General Appearance - Well groomed. Not in acute distress.  Skin Rashes- No Rashes.  HEENT Head- Normal. Ear Auditory Canal - Left- Normal. Right - Normal.Tympanic Membrane- Left- Normal. Right- Normal. Eye Sclera/Conjunctiva- Left- Normal. Right- Normal. Nose & Sinuses Nasal Mucosa- Left-  Boggy and Congested. Right-  Boggy and   Congested.Bilateral no  maxillary and  No frontal sinus pressure. Mouth & Throat Lips: Upper Lip- Normal: no dryness, cracking, pallor, cyanosis, or vesicular eruption. Lower Lip-Normal: no dryness, cracking, pallor, cyanosis or vesicular eruption. Buccal Mucosa- Bilateral- No Aphthous ulcers. Oropharynx- No Discharge or Erythema. Tonsils: Characteristics- Bilateral- No Erythema or Congestion. Size/Enlargement- Bilateral- No enlargement. Discharge- bilateral-None.  Neck Neck- Supple. No Masses.   Chest and Lung Exam Auscultation: Breath Sounds:-Clear even and unlabored(lying supine no shortness of breath)  Cardiovascular Auscultation:Rythm- Regular, rate and rhythm. Murmurs & Other Heart Sounds:Ausculatation of the heart reveal- No Murmurs.  Lymphatic Head & Neck General Head & Neck Lymphatics: Bilateral: Description- No Localized lymphadenopathy.  Lower ext- no pedal edema. Negative homans signs. Calves symmetric.       Assessment & Plan:  You have had recent flu and are close to finishing the tamiflu.  Some concern for bronchitis and your last chest x-ray did not show any pneumonia.  Continue with the doxycycline.  You had recent fatigue and lightheaded sensation.  This is probably related to the flu but I do think you are dehydrated.  Although your urine did  not show any ketones.  However you are on Lasix and have not been drinking much and you have lost 4 pounds since last visit.  Discussed your recent diagnosis with Dr. Charlett Blake and we both agree that you are probably mildly dehydrated from poor intake and due to the Lasix.  Over the weekend want you to hold your Lasix and make sure you hydrate well with propel or Gatorade.  Try to drink 2 bottles a day at least.  Also try to eat more than  chicken noodle soup.  I would like you to weigh yourself at home today and then also weigh yourself Monday morning.  Give [place] an update on today's weight at home and weight on Monday.  Also if  you have a blood pressure cuff at home, I want you to check your blood pressure daily over the next 3 days.  Would  like update on your Monday blood pressure reading as well.  Dr. Chriss Czar discussed the possibility of sending you down to the ED for IV hydration but we do not think that is absolutely necessary provided that you hydrate well at home.  I do want to get CMP today outpatient to see if he had any electrolyte abnormalities that might need to be corrected fast through the ED.  Please continue your Advair inhaler and Atrovent.  Use pro-air if needed.  I am going to prescribe you Hycodan which is a stronger cough medicine.  Hold the doxepin presently while using Hycodan.  Also strict advised not to drink any alcohol while using hycodan.  I want you to call us on Monday with update on how you feel, your blood pressure readings and your weight. If you have worse signs and symptoms over the weekend then be seen in the emergency department.  Mackie Pai, PA-C

## 2017-12-21 ENCOUNTER — Other Ambulatory Visit: Payer: Self-pay

## 2017-12-21 ENCOUNTER — Ambulatory Visit (HOSPITAL_COMMUNITY)
Admission: RE | Admit: 2017-12-21 | Discharge: 2017-12-21 | Disposition: A | Discharge status: Home / Self Care | Payer: Medicare Other | Source: Ambulatory Visit | Attending: Internal Medicine | Admitting: Internal Medicine

## 2017-12-21 ENCOUNTER — Encounter (HOSPITAL_COMMUNITY): Payer: Self-pay | Admitting: Internal Medicine

## 2017-12-21 VITALS — BP 118/70 | HR 89 | Wt 194.0 lb

## 2017-12-21 DIAGNOSIS — I129 Hypertensive chronic kidney disease with stage 1 through stage 4 chronic kidney disease, or unspecified chronic kidney disease: Secondary | ICD-10-CM | POA: Insufficient documentation

## 2017-12-21 DIAGNOSIS — I2721 Secondary pulmonary arterial hypertension: Secondary | ICD-10-CM | POA: Diagnosis not present

## 2017-12-21 DIAGNOSIS — G4733 Obstructive sleep apnea (adult) (pediatric): Secondary | ICD-10-CM | POA: Diagnosis not present

## 2017-12-21 DIAGNOSIS — R569 Unspecified convulsions: Secondary | ICD-10-CM | POA: Diagnosis not present

## 2017-12-21 DIAGNOSIS — Z79899 Other long term (current) drug therapy: Secondary | ICD-10-CM | POA: Insufficient documentation

## 2017-12-21 DIAGNOSIS — I272 Pulmonary hypertension, unspecified: Secondary | ICD-10-CM | POA: Insufficient documentation

## 2017-12-21 DIAGNOSIS — N179 Acute kidney failure, unspecified: Secondary | ICD-10-CM | POA: Diagnosis not present

## 2017-12-21 DIAGNOSIS — I50810 Right heart failure, unspecified: Secondary | ICD-10-CM | POA: Diagnosis not present

## 2017-12-21 DIAGNOSIS — K7030 Alcoholic cirrhosis of liver without ascites: Secondary | ICD-10-CM | POA: Diagnosis not present

## 2017-12-21 DIAGNOSIS — D61818 Other pancytopenia: Secondary | ICD-10-CM | POA: Insufficient documentation

## 2017-12-21 DIAGNOSIS — I2729 Other secondary pulmonary hypertension: Secondary | ICD-10-CM

## 2017-12-21 DIAGNOSIS — N183 Chronic kidney disease, stage 3 (moderate): Secondary | ICD-10-CM | POA: Diagnosis not present

## 2017-12-21 DIAGNOSIS — K766 Portal hypertension: Secondary | ICD-10-CM

## 2017-12-21 LAB — BASIC METABOLIC PANEL
ANION GAP: 8 (ref 5–15)
BUN: 49 mg/dL — ABNORMAL HIGH (ref 6–20)
CALCIUM: 8.9 mg/dL (ref 8.9–10.3)
CO2: 16 mmol/L — AB (ref 22–32)
Chloride: 111 mmol/L (ref 101–111)
Creatinine, Ser: 2.17 mg/dL — ABNORMAL HIGH (ref 0.61–1.24)
GFR, EST AFRICAN AMERICAN: 34 mL/min — AB (ref >60)
GFR, EST NON AFRICAN AMERICAN: 30 mL/min — AB (ref >60)
Glucose, Bld: 101 mg/dL — ABNORMAL HIGH (ref 65–99)
Potassium: 4.6 mmol/L (ref 3.5–5.1)
SODIUM: 135 mmol/L (ref 135–145)

## 2017-12-21 NOTE — Progress Notes (Signed)
ADVANCED HF CLINIC NOTE  Patient ID: Darren Mueller, male   DOB: 1951/08/28, 67 y.o.   MRN: [id number] GI : Darren Mueller PCP: Darren Mueller  HPI:  Darren Mueller ("Darren Mueller") is a 67 yo with history of COPD, portopulmonary hypertension with RV failure, and ETOH cirrhosis.  Admitted in 6/17 with CP and SOB. Was diuresed. Troponins normal. Underwent R/L cath. Minimal CAD with moderate PAH and preserved cardiac output.   Admitted 06/2016 and 07/2016 with syncope.  On September admission found to have elevated ETOH level as well as R 6th rib fracture.   Wore 30 day monitory up until 08/28/16. No AF noted. No events.  9% tachycardia.   Admitted Darren end of February after syncopal event. LINQ placed.   Admitted 7/4 through 05/08/17 with volume depletion. Diuretics held and changed to as needed for weight 200 pounds or greater. Discharge weight was 194 pounds.   Saw PCP for flu follow up on 12/18/17. Creatinine increased 1.7 > 2.5 at Darren time, so lasix was held with deferment to HF follow up appointment today. Darren Mueller was not held.  Presents today for HF follow up. Since last week, has been dizzy when sitting to standing. Drinking propel daily, but not much intake. No fever. Still has chills and congestion. Using an inhaler BID for wheezing. Nonproductive cough. Sleeping sitting up last 2 weeks r/t URI. No PND. SOB with minimal activity, SOB walking from waiting room. Weights dropping at home. Down 10 lbs from last visit. No palpitations. No CP. Taking all meds as prescribed. No appetite. No further seizure-like activity, but still taking keppra. Stopped drinking ETOH >2 weeks. Off lasix, but still taking spiro. Not urinating much. Abdomen swollen.  PAH meds 1) Macitnentan 10 mg daily 2) Adcirca 40 mg daily  3) Selexipeg  1000/1200 daily  Studies: ECHO 12/14 EF 55-60% Peak PA pressure 35. Severe RV dysfunction  ECHO 7/15 EF 60% RV moderately to severely dilated. Moderate HK RVSP 75m HG  ECHO 6/16 EF 60% RV  moderately to severely dilated. Severe HK RVSP ~65 mm HG. D-shaped septum Echo 2/17 LVEF 60-65% RV massively dilated. Flat septum. Severe HK. Moderate TR RVSP ~65. IVC small. No effusion  ECHO 01/01/2017 EF 50-55% Grade I DD. RV severely dilated. Peak PA pressure 62 mm hg  Cath 6/17 Mid RCA lesion, 20% stenosed. Dist LAD lesion, 20% stenosed. Ao = 107/70 (87) LV = 106/3/11 RA = 4 RV = 74/11 PA = 74/26 (45) PCW = 6 Fick cardiac output/index = 4.6.2.2 PVR = 8.5 Ao sat = 95% PA sat = 61%, 58%  RHC 2/17 RA = 11 RV = 80/16/19 PA = 83/61 (71) PCW = 9 Fick cardiac output/index = 5.6/2.6 PVR = 10.3 WU Ao sat = 94% PA sat = 65%, 68% High SVC sat = 65%  RHC 2/14: RA = 13  RV = 63/5/14  PA = 65/24 (41)  PCW = 6  Hepatic wedge = 21  Fick cardiac output/index = 3.8/1.8  PVR = 9.1  FA sat = 94%  PA sat = 58%, 56%  SVC = 54%  RHC 06/06/14 RA = 5  RV = 73/5/9  PA = 81/25 (46)  PCW = 7  Fick cardiac output/index = 6.0/2.8  PVR = 6.5 [place]  FA sat = 95%  PA sat = 71%, 67%  Unable to get hepatic wedge  PFTs 3/14 FEV1 2.02 L (58%) FVC 2.7L (54%) FEV1/FVC (89%) DLCO 53%  PFTs 1/17 FEV1 2.05 L (61%)  FVC 2.48L (56%) DLCO 44%  Event Monitor - NSR 10/26/13.   Ab u/s 8/16: + cirrhosis/mild to moderate splenomegaly  6 min walk 01/10/13, 1290 feet  6MW (01/10/14) = 1280 feet (380 m) 6MW (9/15) = 1350 feet (411 m) O2 sats ranged from 89-95% on room air, HR ranged from 100-129. 6MW (6/16) = 384 meters  6MW (2/17) = 1250 feet (348m 6MW (8/17) = 1320 feet      Current Outpatient Medications on File Prior to Encounter  Medication Sig Dispense Refill  . Albuterol Sulfate (PROAIR RESPICLICK) 1932(90 Base) MCG/ACT AEPB Inhale 2 puffs into Darren lungs every 6 (six) hours as needed (SOB or wheezing).    .Marland Kitchenallopurinol (ZYLOPRIM) 100 MG tablet Take 200 mg by mouth daily.    . colchicine 0.6 MG tablet TAKE 1 TABLET (0.6 MG TOTAL) BY MOUTH 2 (TWO) TIMES DAILY. 20 tablet 1  .  doxycycline (VIBRA-TABS) 100 MG tablet Take 1 tablet (100 mg total) by mouth 2 (two) times daily. Can give caps or generic 20 tablet 0  . fluticasone-salmeterol (ADVAIR HFA) 230-21 MCG/ACT inhaler Inhale 2 puffs into Darren lungs every morning.    . furosemide (LASIX) 40 MG tablet Take 80 mg by mouth daily.     .Marland KitchenHYDROcodone-homatropine (HYCODAN) 5-1.5 MG/5ML syrup Take 5 mLs by mouth every 8 (eight) hours as needed. 100 mL 0  . ipratropium (ATROVENT HFA) 17 MCG/ACT inhaler Inhale 2 puffs into Darren lungs every 6 (six) hours as needed for wheezing. 1 Inhaler 12  . levETIRAcetam (KEPPRA) 500 MG tablet Take 1 tablet (500 mg total) by mouth 2 (two) times daily. 60 tablet 0  . losartan (COZAAR) 50 MG tablet TAKE 1 TABLET DAILY 90 tablet 2  . Macitentan (OPSUMIT) 10 MG TABS Take 10 mg by mouth daily. 90 tablet 3  . omeprazole (PRILOSEC) 40 MG capsule TAKE 1 CAPSULE DAILY 90 capsule 2  . oseltamivir (TAMIFLU) 75 MG capsule Take 1 capsule (75 mg total) by mouth 2 (two) times daily. Pt treated for flu. 10 capsule 0  . potassium chloride (K-DUR) 10 MEQ tablet Take 20 mEq by mouth daily.     . rosuvastatin (CRESTOR) 5 MG tablet Take 1 tablet (5 mg total) by mouth daily. 30 tablet 5  . Selexipag (UPTRAVI) 1000 MCG TABS Take 1,000 mcg by mouth 2 (two) times daily. 60 tablet 11  . spironolactone (ALDACTONE) 50 MG tablet TAKE 1 TABLET DAILY 90 tablet 1  . tadalafil, PAH, (ADCIRCA) 20 MG tablet Take 2 tablets (40 mg total) by mouth daily. 60 tablet 11   No current facility-administered medications on file prior to encounter.     Objective:   Weight Range:  Vital Signs:   Pulse Rate:  [89] 89 (02/18 1106) BP: (118)/(70) 118/70 (02/18 1106) SpO2:  [98 %] 98 % (02/18 1106) Weight:  [194 lb (88 kg)] 194 lb (88 kg) (02/18 1106)       Wt Readings from Last 3 Encounters:  12/21/17 194 lb (88 kg)  12/18/17 196 lb (88.9 kg)  12/14/17 200 lb 3.2 oz (90.8 kg)    Physical Exam: General: Well appearing.  Flushed No resp difficulty. HEENT: Normal Neck: Supple. JVP 5-6. Carotids 2+ bilat; no bruits. No thyromegaly or nodule noted. Cor: PMI nondisplaced. RRR, 2/6 TR prominent P2 + RV lift Lungs: CTAB, normal effort. Abdomen: firm, non-tender, distended, no HSM. No bruits or masses. +BS  Extremities: No cyanosis, clubbing, or rash. R and LLE no edema.  Neuro: Alert & orientedx3, cranial nerves grossly intact. moves all 4 extremities w/o difficulty. Affect pleasant   Assessment/Plan    1. PAH: Suspected portopulmonary HTN in setting of ETOH cirrhosis. - Chronic NYHA III.  - Volume status low today with symptomatic orthostasis - Continue selexipag 1000 mcg twice a day. Intolerant higher dose  due to diarrhea and headaches .  - Continue macitentan 10 daily and adcirca 40 mg daily, - Discussed need for complete ETOH cessation.  2. Acute on CKD III:  - Creatinine elevated on 2/15 to 2.45. Lasix held by PCP. Spiro and potassium continued. - Recheck BMET today 3. Cirrhosis:  - Likely combination of RV failure and ETOH - Recently stopped drinking due to illness. Congratulated on cessation. - Follows with Darren. Ardis Mueller.  4. Recurrent syncope/seizure - by history event sounds like a seizure but need to make sure not cardiac source. Seen in ED 1/15. ETOH level high,. CT brain ok. Started on keppr but has not taken yet (Has h/o seizure in remote past) - LINQ interrogated personally and no arrhythmias and thus most likely seizure - Continue Keppra. Has an appointment with neurology - Stopped drinking ETOH 2 weeks ago with acute illness (flu). Congratulated and encouraged him to continue cessation. 5. HTN:  - Stable. No change. 6. OSA:  - Has mild OSA with AHI 12 and desats down to 82%.  - Intolerant CPAP.  7. Pancytopenia:  - Has seen Darren. Alen Blew in Hematology - felt like it is due to splenic sequestration.  8. A/C Diastolic Heart Failure See above.   Darren Shore, NP  11:22 AM  Patient seen  and examined with Darren above-signed Advanced Practice Provider and/or Housestaff. I personally reviewed laboratory data, imaging studies and relevant notes. I independently examined Darren patient and formulated Darren important aspects of Darren plan. I have edited Darren note to reflect any of my changes or salient points. I have personally discussed Darren plan with Darren patient and/or family.  67 y/o with portopulmonary HTN in setting of ETOH cirrhosis. Recently with symptomatic orthostasis and AKI in setting of viral illness (possible flu). Now improving but still a bit congested. PCP has stopped lasix. On exam weight down about 10 pounds and appears dry. Creatinine today improving to 2.1 but still not back to baseline. Will have him continue to hold lasix and liberalize salt and fluid for 2 days. Resume lasix when orthostasis resolves. Continue triple therapy for PAH.   Darren Bickers, MD  10:19 PM

## 2017-12-21 NOTE — Patient Instructions (Signed)
Hold your Furosemide (Lasix) until your weight comes back up  Feel free to eat some potato chips today  Labs today  Your physician recommends that you schedule a follow-up appointment in: 4 months

## 2017-12-21 NOTE — Telephone Encounter (Signed)
Spoke with pt regarding below.  He saw cardiology today and was advised to hold lasix until further notice. He is to increase salt intake x 2 days and continue drinking Propel. Had labs at cardiology today and was told to call them back if he continues to have dizziness. Pt states he is fine and wants to thank PA, Saguier for checking on him today.

## 2017-12-28 ENCOUNTER — Other Ambulatory Visit (HOSPITAL_COMMUNITY): Payer: Self-pay | Admitting: *Deleted

## 2017-12-28 DIAGNOSIS — I272 Pulmonary hypertension, unspecified: Secondary | ICD-10-CM

## 2017-12-28 MED ORDER — MACITENTAN 10 MG PO TABS: 10.0000 mg | ORAL_TABLET | Freq: Every day | ORAL | 11 refills | Status: DC

## 2017-12-30 ENCOUNTER — Ambulatory Visit (INDEPENDENT_AMBULATORY_CARE_PROVIDER_SITE_OTHER): Payer: Medicare Other | Admitting: *Deleted

## 2017-12-30 DIAGNOSIS — R55 Syncope and collapse: Secondary | ICD-10-CM | POA: Diagnosis not present

## 2017-12-31 NOTE — Progress Notes (Signed)
Carelink Summary Report / Loop Recorder 

## 2018-01-12 ENCOUNTER — Other Ambulatory Visit: Payer: Self-pay | Admitting: Family Medicine

## 2018-01-12 MED FILL — COLCHICINE 0.6 MG TABS: 0.6 | 10 days supply | Qty: 20 | Fill #0

## 2018-01-12 MED FILL — TADALAFIL (PAH) 20 MG TABS: 20 | 30 days supply | Qty: 60 | Fill #8

## 2018-01-12 MED FILL — ROSUVASTATIN CALCIUM 5 MG T: 5 | 30 days supply | Qty: 30 | Fill #5

## 2018-01-13 ENCOUNTER — Other Ambulatory Visit: Payer: Self-pay

## 2018-01-13 MED ORDER — LEVETIRACETAM 500 MG PO TABS: 500.0000 mg | ORAL_TABLET | Freq: Two times a day (BID) | ORAL | 0 refills | Status: DC

## 2018-01-13 MED FILL — levETIRAcetam 500 MG TABS: 500 | 30 days supply | Qty: 60 | Fill #0

## 2018-01-25 ENCOUNTER — Ambulatory Visit (INDEPENDENT_AMBULATORY_CARE_PROVIDER_SITE_OTHER): Payer: Medicare Other | Admitting: Neurology

## 2018-01-25 ENCOUNTER — Encounter: Payer: Self-pay | Admitting: Neurology

## 2018-01-25 ENCOUNTER — Other Ambulatory Visit: Payer: Self-pay

## 2018-01-25 VITALS — BP 116/60 | HR 94 | Ht 72.0 in | Wt 200.0 lb

## 2018-01-25 DIAGNOSIS — R569 Unspecified convulsions: Secondary | ICD-10-CM | POA: Diagnosis not present

## 2018-01-25 DIAGNOSIS — R55 Syncope and collapse: Secondary | ICD-10-CM | POA: Diagnosis not present

## 2018-01-25 NOTE — Patient Instructions (Signed)
1. Schedule 1-hour EEG. If this is normal, we will schedule a 24-hour EEG 2. Continue Keppra 576m once a day for now 3. As per [place] driving laws, no driving after an episode of loss of consciousness, until 6 months event-free 4. Follow-up in 3-4 months, call for any changes

## 2018-01-25 NOTE — Progress Notes (Signed)
NEUROLOGY CONSULTATION NOTE  Daymien Goth MRN: [id number] DOB: 1951-04-24  Referring provider: Dr. Glori Bickers Primary care provider: Dr. Penni Homans  Reason for consult:  New onset seizure  Dear Dr Haroldine Laws:  Thank you for your kind referral of Ronie Barnhart for consultation of the above symptoms. Although his history is well known to you, please allow me to reiterate it for the purpose of our medical record. He is alone in the office today. Records and images were personally reviewed where available.  HISTORY OF PRESENT ILLNESS: This is a pleasant 67 year old left-handed man with a history of hypertension, hyperlipidemia, syncope, presenting for evaluation of a witnessed shaking episode last 11/18/17. He recalls watching TV then has no recollection until the ambulance was there. His son reported he was sitting on the couch then started shaking. He recalls feeling dizzy when he came to. He denied any muscle soreness, tongue bite, or incontinence. He was brought to [facility] ER where bloodwork showed was remarkable for a WBC of 2.6, Hct 33.9, platelet count of 43, creatinine of 2.09, AST 65, alkaline phos 171, total bilirubin 2.2, and elevated EtOH level of 164 and elevated lactic acid of 2.14. CK normal. He recalls having 3 beers that night. I personally reviewed head CT without contrast which did not show any acute changes, there was mild atrophy and chronic microvascular disease. He has had several syncopal episodes in the past, he was admitted in August 2017 and September 2017, with this admission he had an elevated alcohol level and rib fracture. He wore a 30-day monitor with no events captured, no arrhythmia. He had another syncopal episode in February 2018 and had a loop recorder placed. No arrhythmia seen on loop recorder. Due to report of prior seizures and recurrent syncope, he was discharged home on Keppra 56m BID, but has only been taking it once a day.   He has been living  with his son the past 3 years, he denies being told of any staring/unresponsive episodes, he denies any other gaps in time, no olfactory/gustatory hallucinations, deja vu, rising epigastric sensation, focal numbness/tingling/weakness, myoclonic jerks. When he was in the ER last January, there is report of him having seizure in the past but never taking seizure medication. Today he denies any prior history of seizures. He [place] [employer] [place] have [place] him to do that if he had seizures. He has dizziness every now and then where he feels lightheaded for a couple of seconds when he gets up too fast. He denies any headaches, diplopia, dysarthria/dysphagia, neck/back pain, bladder dysfunction. He has occasional diarrhea. He had a normal birth and early development.  There is no history of febrile convulsions, CNS infections such as meningitis/encephalitis, significant traumatic brain injury, neurosurgical procedures, or family history of seizures. He reports drinking 1-2 beers at night now.  PAST MEDICAL HISTORY: Past Medical History:  Diagnosis Date  . Alcohol dependence ([facility] 03/22/2011   In remission   . Alcohol dependence in remission ([facility] 03/22/2011   In remission   . Alcoholic cirrhosis of liver with ascites ([facility] 2014  . Dermatitis 06/10/2015  . Diarrhea 09/04/2013  . Elbow pain, left 03/17/2017  . GERD (gastroesophageal reflux disease)   . Gout   . Heart murmur   . Hx of colonic polyps   . Hypertension   . Hypertriglyceridemia 03/17/2017  . Hypopotassemia   . Otalgia 11/28/2013  . Other chronic pulmonary heart diseases   .  Preventative health care 06/29/2016  . Red eye 03/03/2016   right  . Renal insufficiency 07/18/2013  . Seizures ([place])   . Sleep apnea    does not wear CPAP  . Thrombocytopenia ([place]) 06/29/2016  . Unspecified hypothyroidism 01/25/2013  . Unspecified pleural effusion     PAST SURGICAL HISTORY: Past Surgical History:  Procedure Laterality Date   . CARDIAC CATHETERIZATION N/A 12/21/2015   Procedure: Right Heart Cath;  Surgeon: Jolaine Artist, MD;  Location: [facility];  Service: Cardiovascular;  Laterality: N/A;  . CARDIAC CATHETERIZATION N/A 04/15/2016   Procedure: Right/Left Heart Cath and Coronary Angiography;  Surgeon: Jolaine Artist, MD;  Location: [facility];  Service: Cardiovascular;  Laterality: N/A;  . COLONOSCOPY N/A 12/08/2013   Procedure: COLONOSCOPY;  Surgeon: Milus Banister, MD;  Location: [facility];  Service: Endoscopy;  Laterality: N/A;  . ESOPHAGOGASTRODUODENOSCOPY (EGD) WITH PROPOFOL N/A 03/27/2016   Procedure: ESOPHAGOGASTRODUODENOSCOPY (EGD) WITH PROPOFOL;  Surgeon: Milus Banister, MD;  Location: [place];  Service: Endoscopy;  Laterality: N/A;  . LOOP RECORDER INSERTION N/A 01/01/2017   Procedure: Loop Recorder Insertion;  Surgeon: Thompson Grayer, MD;  Location: [facility];  Service: Cardiovascular;  Laterality: N/A;  . RIGHT HEART CATHETERIZATION Right 06/06/2014   Procedure: RIGHT HEART CATH;  Surgeon: Jolaine Artist, MD;  Location: [facility] CATH LAB;  Service: Cardiovascular;  Laterality: Right;  . UVULOPALATOPHARYNGOPLASTY  1999    MEDICATIONS: Current Outpatient Medications on File Prior to Visit  Medication Sig Dispense Refill  . Albuterol Sulfate (PROAIR RESPICLICK) 696 (90 Base) MCG/ACT AEPB Inhale 2 puffs into the lungs every 6 (six) hours as needed (SOB or wheezing).    Marland Kitchen allopurinol (ZYLOPRIM) 100 MG tablet Take 200 mg by mouth daily.    . colchicine 0.6 MG tablet TAKE 1 TABLET (0.6 MG TOTAL) BY MOUTH 2 (TWO) TIMES DAILY. 20 tablet 1  . doxycycline (VIBRA-TABS) 100 MG tablet Take 1 tablet (100 mg total) by mouth 2 (two) times daily. Can give caps or generic 20 tablet 0  . fluticasone-salmeterol (ADVAIR HFA) 230-21 MCG/ACT inhaler Inhale 2 puffs into the lungs every morning.    . furosemide (LASIX) 40 MG tablet Take 80 mg by mouth daily.     Marland Kitchen HYDROcodone-homatropine  (HYCODAN) 5-1.5 MG/5ML syrup Take 5 mLs by mouth every 8 (eight) hours as needed. 100 mL 0  . ipratropium (ATROVENT HFA) 17 MCG/ACT inhaler Inhale 2 puffs into the lungs every 6 (six) hours as needed for wheezing. 1 Inhaler 12  . levETIRAcetam (KEPPRA) 500 MG tablet Take 1 tablet (500 mg total) by mouth 2 (two) times daily. 60 tablet 0  . losartan (COZAAR) 50 MG tablet TAKE 1 TABLET DAILY 90 tablet 2  . Macitentan (OPSUMIT) 10 MG TABS Take 1 tablet (10 mg total) by mouth daily. 30 tablet 11  . omeprazole (PRILOSEC) 40 MG capsule TAKE 1 CAPSULE DAILY 90 capsule 2  . oseltamivir (TAMIFLU) 75 MG capsule Take 1 capsule (75 mg total) by mouth 2 (two) times daily. Pt treated for flu. 10 capsule 0  . potassium chloride (K-DUR) 10 MEQ tablet Take 20 mEq by mouth daily.     . rosuvastatin (CRESTOR) 5 MG tablet Take 1 tablet (5 mg total) by mouth daily. 30 tablet 5  . Selexipag (UPTRAVI) 1000 MCG TABS Take 1,000 mcg by mouth 2 (two) times daily. 60 tablet 11  . spironolactone (ALDACTONE) 50 MG tablet TAKE 1 TABLET DAILY 90 tablet 1  .  tadalafil, PAH, (ADCIRCA) 20 MG tablet Take 2 tablets (40 mg total) by mouth daily. 60 tablet 11   No current facility-administered medications on file prior to visit.     ALLERGIES: Allergies  Allergen Reactions  . Aspirin Other (See Comments)    hypertension  . Nitroglycerin     Headache    FAMILY HISTORY: Family History  Problem Relation Age of Onset  . Heart disease Mother   . Heart attack Mother   . Hypertension Mother   . Kidney failure Mother   . Prostate cancer Father   . Colon cancer Neg Hx     SOCIAL HISTORY: Social History   Socioeconomic History  . Marital status: Divorced    Spouse name: Not on file  . Number of children: 3  . Years of education: Not on file  . Highest education level: Not on file  Occupational History  . Occupation: Retired    Comment: Microbiologist  . Financial resource strain: Not on file  . Food  insecurity:    Worry: Not on file    Inability: Not on file  . Transportation needs:    Medical: Not on file    Non-medical: Not on file  Tobacco Use  . Smoking status: Former Smoker    Packs/day: 2.00    Years: 5.00    Pack years: 10.00    Types: Cigarettes    Last attempt to quit: 09/22/1979    Years since quitting: 38.3  . Smokeless tobacco: Never Used  Substance and Sexual Activity  . Alcohol use: Yes    Alcohol/week: 3.6 oz    Types: 6 Shots of liquor per week    Comment: social drinker  . Drug use: No  . Sexual activity: Not Currently    Birth control/protection: Spermicide  Lifestyle  . Physical activity:    Days per week: Not on file    Minutes per session: Not on file  . Stress: Not on file  Relationships  . Social connections:    Talks on phone: Not on file    Gets together: Not on file    Attends religious service: Not on file    Active member of club or organization: Not on file    Attends meetings of clubs or organizations: Not on file    Relationship status: Not on file  . Intimate partner violence:    Fear of current or ex partner: Not on file    Emotionally abused: Not on file    Physically abused: Not on file    Forced sexual activity: Not on file  Other Topics Concern  . Not on file  Social History Narrative   0 caffeine drinks daily     REVIEW OF SYSTEMS: Constitutional: No fevers, chills, or sweats, no generalized fatigue, change in appetite Eyes: No visual changes, double vision, eye pain Ear, nose and throat: No hearing loss, ear pain, nasal congestion, sore throat Cardiovascular: No chest pain, palpitations Respiratory:  No shortness of breath at rest or with exertion, wheezes GastrointestinaI: No nausea, vomiting, diarrhea, abdominal pain, fecal incontinence Genitourinary:  No dysuria, urinary retention or frequency Musculoskeletal:  No neck pain, back pain Integumentary: No rash, pruritus, skin lesions Neurological: as  above Psychiatric: No depression, insomnia, anxiety Endocrine: No palpitations, fatigue, diaphoresis, mood swings, change in appetite, change in weight, increased thirst Hematologic/Lymphatic:  No anemia, purpura, petechiae. Allergic/Immunologic: no itchy/runny eyes, nasal congestion, recent allergic reactions, rashes  PHYSICAL EXAM: Vitals:  01/25/18 0948  BP: 116/60  Pulse: 94  SpO2: 96%   General: No acute distress Head:  Normocephalic/atraumatic Eyes: Fundoscopic exam shows bilateral sharp discs, no vessel changes, exudates, or hemorrhages Neck: supple, no paraspinal tenderness, full range of motion Back: No paraspinal tenderness Heart: regular rate and rhythm Lungs: Clear to auscultation bilaterally. Vascular: No carotid bruits. Skin/Extremities: No rash,bipedal edema Neurological Exam: Mental status: alert and oriented to person, place, and time, no dysarthria or aphasia, Fund of knowledge is appropriate.  Recent and remote memory are intact. 2/3 delayed recall. Attention and concentration are normal.    Able to name objects and repeat phrases. Cranial nerves: CN I: not tested CN II: pupils equal, round and reactive to light, visual fields intact, fundi unremarkable. CN III, IV, VI:  full range of motion, no nystagmus, no ptosis CN V: facial sensation intact CN VII: upper and lower face symmetric CN VIII: hearing intact to finger rub CN IX, X: gag intact, uvula midline CN XI: sternocleidomastoid and trapezius muscles intact CN XII: tongue midline Bulk & Tone: normal, no fasciculations. Motor: 5/5 throughout with no pronator drift. Sensation: intact to light touch, cold, pin on both UE and LE. Decreased vibration to left ankle.No extinction to double simultaneous stimulation.  Romberg test negative Deep Tendon Reflexes: +2 throughout, no ankle clonus Plantar responses: downgoing bilaterally Cerebellar: no incoordination on finger to nose, heel to shin. No  dysdiadochokinesia Gait: narrow-based and steady, able to tandem walk adequately. Tremor: none  IMPRESSION: This is a pleasant 67 year old left-handed man with a history of hypertension, hyperlipidemia, syncope, presenting for evaluation of a witnessed shaking episode last 11/18/17. His neurological exam is normal, head CT normal. He has a loop recorder with no arrhythmia noted with most recent episode. His alcohol level was elevated at that time. Etiology of symptoms unclear, he denies any prior history of seizures. He is taking the Keppra 535m qhs, continue low dose for now. He will be scheduled for a 1-hour EEG to assess for focal abnormalities that increase risk for recurrent seizures. If normal, we will do a 24-hour EEG to further classify his symptoms. We discussed [place] driving laws, no driving after an episode of loss of consciousness until 6 months event-free. He will follow-up in 3-4 months and knows to call for any changes.   Thank you for allowing me to participate in the care of this patient. Please do not hesitate to call for any questions or concerns.   KEllouise Newer M.D.  CC: Dr. BHaroldine Laws Dr. BCharlett Blake

## 2018-01-27 ENCOUNTER — Other Ambulatory Visit: Payer: Self-pay

## 2018-01-27 ENCOUNTER — Ambulatory Visit (INDEPENDENT_AMBULATORY_CARE_PROVIDER_SITE_OTHER): Payer: Medicare Other | Admitting: Neurology

## 2018-01-27 DIAGNOSIS — R55 Syncope and collapse: Secondary | ICD-10-CM | POA: Diagnosis not present

## 2018-01-27 DIAGNOSIS — R569 Unspecified convulsions: Secondary | ICD-10-CM | POA: Diagnosis not present

## 2018-01-27 MED ORDER — LEVETIRACETAM 500 MG PO TABS: 500.0000 mg | ORAL_TABLET | Freq: Two times a day (BID) | ORAL | 3 refills | Status: DC

## 2018-02-01 ENCOUNTER — Encounter: Payer: Self-pay | Admitting: Family Medicine

## 2018-02-01 ENCOUNTER — Telehealth: Payer: Self-pay | Admitting: Emergency Medicine

## 2018-02-01 ENCOUNTER — Ambulatory Visit (INDEPENDENT_AMBULATORY_CARE_PROVIDER_SITE_OTHER): Payer: Medicare Other | Admitting: Family Medicine

## 2018-02-01 ENCOUNTER — Ambulatory Visit (INDEPENDENT_AMBULATORY_CARE_PROVIDER_SITE_OTHER): Payer: Medicare Other | Admitting: *Deleted

## 2018-02-01 ENCOUNTER — Other Ambulatory Visit: Payer: Self-pay | Admitting: Internal Medicine

## 2018-02-01 VITALS — BP 114/58 | HR 83 | Resp 16 | Ht 72.0 in | Wt 201.8 lb

## 2018-02-01 DIAGNOSIS — G40909 Epilepsy, unspecified, not intractable, without status epilepticus: Secondary | ICD-10-CM | POA: Diagnosis not present

## 2018-02-01 DIAGNOSIS — K7030 Alcoholic cirrhosis of liver without ascites: Secondary | ICD-10-CM

## 2018-02-01 DIAGNOSIS — R55 Syncope and collapse: Secondary | ICD-10-CM

## 2018-02-01 DIAGNOSIS — N289 Disorder of kidney and ureter, unspecified: Secondary | ICD-10-CM | POA: Diagnosis not present

## 2018-02-01 DIAGNOSIS — D61818 Other pancytopenia: Secondary | ICD-10-CM | POA: Diagnosis not present

## 2018-02-01 DIAGNOSIS — I10 Essential (primary) hypertension: Secondary | ICD-10-CM | POA: Diagnosis not present

## 2018-02-01 DIAGNOSIS — N183 Chronic kidney disease, stage 3 unspecified: Secondary | ICD-10-CM

## 2018-02-01 DIAGNOSIS — E785 Hyperlipidemia, unspecified: Secondary | ICD-10-CM

## 2018-02-01 DIAGNOSIS — M109 Gout, unspecified: Secondary | ICD-10-CM

## 2018-02-01 DIAGNOSIS — N2889 Other specified disorders of kidney and ureter: Secondary | ICD-10-CM

## 2018-02-01 LAB — CBC
HEMATOCRIT: 31.8 % — AB (ref 39.0–52.0)
Hemoglobin: 10.7 g/dL — ABNORMAL LOW (ref 13.0–17.0)
MCHC: 33.7 g/dL (ref 30.0–36.0)
MCV: 108 fl — AB (ref 78.0–100.0)
RBC: 2.94 Mil/uL — AB (ref 4.22–5.81)
RDW: 17.9 % — ABNORMAL HIGH (ref 11.5–15.5)
WBC: 1.5 10*3/uL — CL (ref 4.0–10.5)

## 2018-02-01 LAB — COMPREHENSIVE METABOLIC PANEL
ALBUMIN: 3.6 g/dL (ref 3.5–5.2)
ALT: 27 U/L (ref 0–53)
AST: 70 U/L — AB (ref 0–37)
Alkaline Phosphatase: 265 U/L — ABNORMAL HIGH (ref 39–117)
BUN: 22 mg/dL (ref 6–23)
CALCIUM: 8.7 mg/dL (ref 8.4–10.5)
CHLORIDE: 110 meq/L (ref 96–112)
CO2: 22 meq/L (ref 19–32)
Creatinine, Ser: 1.45 mg/dL (ref 0.40–1.50)
GFR: 62.24 mL/min (ref >60.00)
Glucose, Bld: 129 mg/dL — ABNORMAL HIGH (ref 70–99)
POTASSIUM: 4.6 meq/L (ref 3.5–5.1)
Sodium: 139 mEq/L (ref 135–145)
Total Bilirubin: 1.5 mg/dL — ABNORMAL HIGH (ref 0.2–1.2)
Total Protein: 7.1 g/dL (ref 6.0–8.3)

## 2018-02-01 LAB — LIPID PANEL
CHOL/HDL RATIO: 2
CHOLESTEROL: 135 mg/dL (ref 0–200)
HDL: 70 mg/dL (ref >39.00)
LDL CALC: 41 mg/dL (ref 0–99)
NonHDL: 65.1
TRIGLYCERIDES: 119 mg/dL (ref 0.0–149.0)
VLDL: 23.8 mg/dL (ref 0.0–40.0)

## 2018-02-01 LAB — URIC ACID: URIC ACID, SERUM: 3.4 mg/dL — AB (ref 4.0–7.8)

## 2018-02-01 LAB — TSH: TSH: 3.8 u[IU]/mL (ref 0.35–4.50)

## 2018-02-01 MED ORDER — ROSUVASTATIN CALCIUM 5 MG PO TABS: 5.0000 mg | ORAL_TABLET | Freq: Every day | ORAL | 1 refills | Status: DC

## 2018-02-01 MED ORDER — ALLOPURINOL 100 MG PO TABS: 200.0000 mg | ORAL_TABLET | Freq: Every day | ORAL | 1 refills | Status: DC

## 2018-02-01 MED FILL — COLCHICINE 0.6 MG TABS: 0.6 | 10 days supply | Qty: 20 | Fill #1

## 2018-02-01 MED FILL — ALLOPURINOL 100 MG TABS: 100 | 45 days supply | Qty: 90 | Fill #0

## 2018-02-01 NOTE — Assessment & Plan Note (Signed)
Patient is referred to nephrology for further consideration

## 2018-02-01 NOTE — Assessment & Plan Note (Signed)
Well controlled, no changes to meds. Encouraged heart healthy diet such as the DASH diet and exercise as tolerated.

## 2018-02-01 NOTE — Assessment & Plan Note (Signed)
No recent flares, will try and drop Allopurinol go 100 mg daily due to renal dysfunction and monitor, may stop altogether if no flares.

## 2018-02-01 NOTE — Telephone Encounter (Signed)
Will continue to monitor and repeat labwork

## 2018-02-01 NOTE — Assessment & Plan Note (Signed)
New onset, no recurrence since home, is following with neurology and has just completed EEG but noresults available yet.

## 2018-02-01 NOTE — Telephone Encounter (Signed)
"  CRITICAL VALUE STICKER  CRITICAL VALUE:[address] 1.5   [address] 27,000   RECEIVER (on-site recipient of call):Kristy P.  DATE & TIME NOTIFIED: 02-01-18  MESSENGER (representative from lab):Karen W.  MD NOTIFIED: Charlett Blake  TIME OF NOTIFICATION:1239 RESPONSE: Will take care of issue.

## 2018-02-01 NOTE — Assessment & Plan Note (Addendum)
Follows with Dr Alen Blew

## 2018-02-01 NOTE — Patient Instructions (Signed)

## 2018-02-01 NOTE — Progress Notes (Signed)
Patient ID: Darren Mueller, male   DOB: 08-Mar-1951, 67 y.o.   MRN: [id number]   Subjective:    Patient ID: Darren Mueller, male    DOB: 01/02/1951, 67 y.o.   MRN: [id number]  Chief Complaint  Patient presents with  . Hyperlipidemia    4 months    HPI Patient is in today for follow up and he has just lost his Dad at age 75 and as a result has been helping quite a bit with his mom.  Acknowledges some fatigue but otherwise feels he is managing fairly well.  Did have an EEG with neurology last week secondary to seizure type activity back in January.  Denies any recent activity.  He had quit drinking several weeks before his seizure.  He has recently started again with the final for but reports only a few beverages a couple times a week.  Today no acute complaints.  No chest pain or palpitations no recent shortness of breath or wheezing with the addition of Atrovent and albuterol.  He is only using them each once daily with good results.No GI or GU complaints. No recent gout flare  Past Medical History:  Diagnosis Date  . Alcohol dependence ([place]) 03/22/2011   In remission   . Alcohol dependence in remission ([place]) 03/22/2011   In remission   . Alcoholic cirrhosis of liver with ascites ([place]) 2014  . Dermatitis 06/10/2015  . Diarrhea 09/04/2013  . Elbow pain, left 03/17/2017  . GERD (gastroesophageal reflux disease)   . Gout   . Heart murmur   . Hx of colonic polyps   . Hypertension   . Hypertriglyceridemia 03/17/2017  . Hypopotassemia   . Otalgia 11/28/2013  . Other chronic pulmonary heart diseases   . Preventative health care 06/29/2016  . Red eye 03/03/2016   right  . Renal insufficiency 07/18/2013  . Seizures ([place])   . Sleep apnea    does not wear CPAP  . Thrombocytopenia ([place]) 06/29/2016  . Unspecified hypothyroidism 01/25/2013  . Unspecified pleural effusion     Past Surgical History:  Procedure Laterality Date  . CARDIAC CATHETERIZATION N/A 12/21/2015   Procedure: Right Heart Cath;   Surgeon: Jolaine Artist, MD;  Location: [facility];  Service: Cardiovascular;  Laterality: N/A;  . CARDIAC CATHETERIZATION N/A 04/15/2016   Procedure: Right/Left Heart Cath and Coronary Angiography;  Surgeon: Jolaine Artist, MD;  Location: [facility];  Service: Cardiovascular;  Laterality: N/A;  . COLONOSCOPY N/A 12/08/2013   Procedure: COLONOSCOPY;  Surgeon: Milus Banister, MD;  Location: [facility];  Service: Endoscopy;  Laterality: N/A;  . ESOPHAGOGASTRODUODENOSCOPY (EGD) WITH PROPOFOL N/A 03/27/2016   Procedure: ESOPHAGOGASTRODUODENOSCOPY (EGD) WITH PROPOFOL;  Surgeon: Milus Banister, MD;  Location: [facility];  Service: Endoscopy;  Laterality: N/A;  . LOOP RECORDER INSERTION N/A 01/01/2017   Procedure: Loop Recorder Insertion;  Surgeon: Thompson Grayer, MD;  Location: [facility];  Service: Cardiovascular;  Laterality: N/A;  . RIGHT HEART CATHETERIZATION Right 06/06/2014   Procedure: RIGHT HEART CATH;  Surgeon: Jolaine Artist, MD;  Location: [facility];  Service: Cardiovascular;  Laterality: Right;  . UVULOPALATOPHARYNGOPLASTY  1999    Family History  Problem Relation Age of Onset  . Heart disease Mother   . Heart attack Mother   . Hypertension Mother   . Kidney failure Mother   . Liver disease Mother        stage 4 liver disease  . Prostate cancer Father   .  Cancer Father        lung  . Hypertension Brother   . Gout Brother   . Alcoholism Maternal Grandfather   . Liver disease Maternal Grandfather   . Migraines Sister   . Hypertension Brother   . Colon cancer Neg Hx     Social History   Socioeconomic History  . Marital status: Divorced    Spouse name: Not on file  . Number of children: 3  . Years of education: Not on file  . Highest education level: Not on file  Occupational History  . Occupation: Retired    Comment: Microbiologist  . Financial resource strain: Not on file  . Food insecurity:    Worry: Not on file     Inability: Not on file  . Transportation needs:    Medical: Not on file    Non-medical: Not on file  Tobacco Use  . Smoking status: Former Smoker    Packs/day: 2.00    Years: 5.00    Pack years: 10.00    Types: Cigarettes    Last attempt to quit: 09/22/1979    Years since quitting: 38.3  . Smokeless tobacco: Never Used  Substance and Sexual Activity  . Alcohol use: Yes    Alcohol/week: 3.6 oz    Types: 6 Shots of liquor per week    Comment: social drinker  . Drug use: No  . Sexual activity: Not Currently    Birth control/protection: Spermicide  Lifestyle  . Physical activity:    Days per week: Not on file    Minutes per session: Not on file  . Stress: Not on file  Relationships  . Social connections:    Talks on phone: Not on file    Gets together: Not on file    Attends religious service: Not on file    Active member of club or organization: Not on file    Attends meetings of clubs or organizations: Not on file    Relationship status: Not on file  . Intimate partner violence:    Fear of current or ex partner: Not on file    Emotionally abused: Not on file    Physically abused: Not on file    Forced sexual activity: Not on file  Other Topics Concern  . Not on file  Social History Narrative   0 caffeine drinks daily     Outpatient Medications Prior to Visit  Medication Sig Dispense Refill  . Albuterol Sulfate (PROAIR RESPICLICK) 300 (90 Base) MCG/ACT AEPB Inhale 2 puffs into the lungs every 6 (six) hours as needed (SOB or wheezing).    . colchicine 0.6 MG tablet TAKE 1 TABLET (0.6 MG TOTAL) BY MOUTH 2 (TWO) TIMES DAILY. 20 tablet 1  . doxycycline (VIBRA-TABS) 100 MG tablet Take 1 tablet (100 mg total) by mouth 2 (two) times daily. Can give caps or generic 20 tablet 0  . fluticasone-salmeterol (ADVAIR HFA) 230-21 MCG/ACT inhaler Inhale 2 puffs into the lungs every morning.    . furosemide (LASIX) 40 MG tablet Take 80 mg by mouth daily.     Marland Kitchen HYDROcodone-homatropine  (HYCODAN) 5-1.5 MG/5ML syrup Take 5 mLs by mouth every 8 (eight) hours as needed. 100 mL 0  . ipratropium (ATROVENT HFA) 17 MCG/ACT inhaler Inhale 2 puffs into the lungs every 6 (six) hours as needed for wheezing. 1 Inhaler 12  . levETIRAcetam (KEPPRA) 500 MG tablet Take 1 tablet (500 mg total) by mouth 2 (two) times daily.  60 tablet 3  . losartan (COZAAR) 50 MG tablet TAKE 1 TABLET DAILY 90 tablet 2  . Macitentan (OPSUMIT) 10 MG TABS Take 1 tablet (10 mg total) by mouth daily. 30 tablet 11  . omeprazole (PRILOSEC) 40 MG capsule TAKE 1 CAPSULE DAILY 90 capsule 2  . oseltamivir (TAMIFLU) 75 MG capsule Take 1 capsule (75 mg total) by mouth 2 (two) times daily. Pt treated for flu. 10 capsule 0  . potassium chloride (K-DUR) 10 MEQ tablet Take 20 mEq by mouth daily.     . Selexipag (UPTRAVI) 1000 MCG TABS Take 1,000 mcg by mouth 2 (two) times daily. 60 tablet 11  . spironolactone (ALDACTONE) 50 MG tablet TAKE 1 TABLET DAILY 90 tablet 1  . tadalafil, PAH, (ADCIRCA) 20 MG tablet Take 2 tablets (40 mg total) by mouth daily. 60 tablet 11  . allopurinol (ZYLOPRIM) 100 MG tablet Take 200 mg by mouth daily.    . rosuvastatin (CRESTOR) 5 MG tablet Take 1 tablet (5 mg total) by mouth daily. 30 tablet 5  . tadalafil, PAH, (ADCIRCA) 20 MG tablet   2   No facility-administered medications prior to visit.     Allergies  Allergen Reactions  . Aspirin Other (See Comments)    hypertension  . Nitroglycerin     Headache    Review of Systems  Constitutional: Positive for malaise/fatigue. Negative for fever.  HENT: Negative for congestion.   Eyes: Negative for blurred vision.  Respiratory: Positive for shortness of breath.   Cardiovascular: Negative for chest pain, palpitations and leg swelling.  Gastrointestinal: Negative for abdominal pain, blood in stool and nausea.  Genitourinary: Negative for dysuria and frequency.  Musculoskeletal: Negative for falls.  Skin: Negative for rash.  Neurological:  Negative for dizziness, loss of consciousness and headaches.  Endo/Heme/Allergies: Negative for environmental allergies.  Psychiatric/Behavioral: Negative for depression. The patient is not nervous/anxious.        Objective:    Physical Exam  Constitutional: He is oriented to person, place, and time. He appears well-developed and well-nourished. No distress.  HENT:  Head: Normocephalic and atraumatic.  Nose: Nose normal.  Eyes: Right eye exhibits no discharge. Left eye exhibits no discharge.  Neck: Normal range of motion. Neck supple.  Cardiovascular: Normal rate and regular rhythm.  No murmur heard. Pulmonary/Chest: Effort normal and breath sounds normal.  Abdominal: Soft. Bowel sounds are normal. There is no tenderness.  Musculoskeletal: He exhibits no edema.  Neurological: He is alert and oriented to person, place, and time.  Skin: Skin is warm and dry.  Psychiatric: He has a normal mood and affect.  Nursing note and vitals reviewed.   BP (!) 114/58 (BP Location: Right Arm, Patient Position: Sitting, Cuff Size: Normal)   Pulse 83   Resp 16   Ht 6' (1.829 m)   Wt 201 lb 12.8 oz (91.5 kg)   SpO2 98%   BMI 27.37 kg/m  Wt Readings from Last 3 Encounters:  02/01/18 201 lb 12.8 oz (91.5 kg)  01/25/18 200 lb (90.7 kg)  12/21/17 194 lb (88 kg)     Lab Results  Component Value Date   WBC 3.2 (L) 12/14/2017   HGB 12.3 (L) 12/14/2017   HCT 35.7 (L) 12/14/2017   PLT 25 (L) 12/14/2017   GLUCOSE 101 (H) 12/21/2017   CHOL 166 10/01/2017   TRIG (H) 10/01/2017    420.0 Triglyceride is over 400; calculations on Lipids are invalid.   HDL 62.50 10/01/2017   LDLDIRECT 40.0  10/01/2017   LDLCALC 77 12/04/2015   ALT 46 12/18/2017   AST 82 (H) 12/18/2017   NA 135 12/21/2017   K 4.6 12/21/2017   CL 111 12/21/2017   CREATININE 2.17 (H) 12/21/2017   BUN 49 (H) 12/21/2017   CO2 16 (L) 12/21/2017   TSH 3.97 10/01/2017   PSA 0.49 11/01/2012   INR 1.25 06/04/2016   HGBA1C 5.0  12/14/2017    Lab Results  Component Value Date   TSH 3.97 10/01/2017   Lab Results  Component Value Date   WBC 3.2 (L) 12/14/2017   HGB 12.3 (L) 12/14/2017   HCT 35.7 (L) 12/14/2017   MCV 102.6 (H) 12/14/2017   PLT 25 (L) 12/14/2017   Lab Results  Component Value Date   NA 135 12/21/2017   K 4.6 12/21/2017   CO2 16 (L) 12/21/2017   GLUCOSE 101 (H) 12/21/2017   BUN 49 (H) 12/21/2017   CREATININE 2.17 (H) 12/21/2017   BILITOT 1.5 (H) 12/18/2017   ALKPHOS 215 (H) 12/18/2017   AST 82 (H) 12/18/2017   ALT 46 12/18/2017   PROT 7.4 12/18/2017   ALBUMIN 4.0 12/18/2017   CALCIUM 8.9 12/21/2017   ANIONGAP 8 12/21/2017   GFR 33.99 (L) 12/18/2017   Lab Results  Component Value Date   CHOL 166 10/01/2017   Lab Results  Component Value Date   HDL 62.50 10/01/2017   Lab Results  Component Value Date   LDLCALC 77 12/04/2015   Lab Results  Component Value Date   TRIG (H) 10/01/2017    420.0 Triglyceride is over 400; calculations on Lipids are invalid.   Lab Results  Component Value Date   CHOLHDL 3 10/01/2017   Lab Results  Component Value Date   HGBA1C 5.0 12/14/2017       Assessment & Plan:   Problem List Items Addressed This Visit    Hypertension (Chronic)    Well controlled, no changes to meds. Encouraged heart healthy diet such as the DASH diet and exercise as tolerated.       Relevant Medications   rosuvastatin (CRESTOR) 5 MG tablet   Other Relevant Orders   CBC   Comprehensive metabolic panel   TSH   Chronic renal insufficiency, stage III (moderate) (HCC) (Chronic)    Patient is referred to nephrology for further consideration      Hepatic cirrhosis ([place])    Was not drinking for awhile but has started again with March madness, is encoruaged to stop again before he increases more      Gout    No recent flares, will try and drop Allopurinol go 100 mg daily due to renal dysfunction and monitor, may stop altogether if no flares.      Relevant  Orders   Uric acid   Pancytopenia ([place])    Follows with Dr Alen Blew      Seizure disorder [facility])    New onset, no recurrence since home, is following with neurology and has just completed EEG but noresults available yet.        Other Visit Diagnoses    Renal insufficiency    -  Primary   Relevant Orders   Ambulatory referral to Nephrology   Comprehensive metabolic panel   Hyperlipidemia, unspecified hyperlipidemia type       Relevant Medications   rosuvastatin (CRESTOR) 5 MG tablet   Other Relevant Orders   Lipid panel      I have changed Darren Mueller's allopurinol. I am also having  him maintain his Albuterol Sulfate, fluticasone-salmeterol, spironolactone, losartan, tadalafil (PAH), omeprazole, Selexipag, furosemide, potassium chloride, doxycycline, ipratropium, oseltamivir, HYDROcodone-homatropine, macitentan, colchicine, levETIRAcetam, and rosuvastatin.  Meds ordered this encounter  Medications  . allopurinol (ZYLOPRIM) 100 MG tablet    Sig: Take 2 tablets (200 mg total) by mouth daily.    Dispense:  90 tablet    Refill:  1  . rosuvastatin (CRESTOR) 5 MG tablet    Sig: Take 1 tablet (5 mg total) by mouth daily.    Dispense:  90 tablet    Refill:  1     Penni Homans, MD

## 2018-02-01 NOTE — Assessment & Plan Note (Signed)
Was not drinking for awhile but has started again with March madness, is encoruaged to stop again before he increases more

## 2018-02-02 NOTE — Progress Notes (Signed)
[facility] Summary Reprot / Loop Recorder

## 2018-02-03 MED FILL — ROSUVASTATIN CALCIUM 5 MG T: 5 | 90 days supply | Qty: 90 | Fill #0

## 2018-02-03 NOTE — Procedures (Signed)
ELECTROENCEPHALOGRAM REPORT  Date of Study: 01/27/2018  Patient's Name: Darren Mueller MRN: [id number] Date of Birth: 1951/06/01  Referring Provider: Dr. Ellouise Newer  Clinical History: This is a 67 year old man with new onset seizure.  Medications: PROAIR RESPICLICK 616 (90 Base) MCG/ACT AEPB    ZYLOPRIM 100 MG tablet colchicine 0.6 MG tablet  VIBRA-TABS 100 MG tablet  ADVAIR HFA 230-21 MCG/ACT inhaler LASIX 40 MG tablet  HYCODAN 5-1.5 MG/5ML syrup  ATROVENT HFA 17 MCG/ACT inhaler  KEPPRA 500 MG tablet  COZAAR 50 MG tablet  OPSUMIT 10 MG TABS  PRILOSEC 40 MG capsule 2 TAMIFLU 75 MG capsule  K-DUR 10 MEQ tablet  CRESTOR 5 MG tablet  UPTRAVI 1000 MCG TABS  ALDACTONE 50 MG tablet  ADCIRCA 20 MG tablet   Technical Summary: A multichannel digital 1-hour EEG recording measured by the international 10-20 system with electrodes applied with paste and impedances below 5000 ohms performed in our laboratory with EKG monitoring in an awake and asleep patient.  Hyperventilation was not performed. Photic stimulation was performed.  The digital EEG was referentially recorded, reformatted, and digitally filtered in a variety of bipolar and referential montages for optimal display.    Description: The patient is awake and asleep during the recording.  During maximal wakefulness, there is a symmetric, medium voltage 9.5 Hz posterior dominant rhythm that attenuates with eye opening.  The record is symmetric.  During drowsiness and sleep, there is an increase in theta slowing of the background.  Vertex waves and symmetric sleep spindles were seen.  Photic stimulation did not elicit any abnormalities.  There were no epileptiform discharges or electrographic seizures seen.    EKG lead was unremarkable.  Impression: This 1-hour awake and asleep EEG is normal.    Clinical Correlation: A normal EEG does not exclude a clinical diagnosis of epilepsy.  If further clinical questions remain,  prolonged EEG may be helpful.  Clinical correlation is advised.   Ellouise Newer, M.D.

## 2018-02-04 ENCOUNTER — Encounter: Payer: Self-pay | Admitting: Neurology

## 2018-02-04 ENCOUNTER — Telehealth: Payer: Self-pay

## 2018-02-04 ENCOUNTER — Other Ambulatory Visit (HOSPITAL_COMMUNITY): Payer: Self-pay | Admitting: Internal Medicine

## 2018-02-04 NOTE — Telephone Encounter (Signed)
Spoke with pt relaying message below.  24 hr amb EEG scheduled for Wed 4/10 at 11:30am

## 2018-02-04 NOTE — Telephone Encounter (Signed)
-----  Message from Cameron Sprang, MD sent at 02/03/2018 11:03 AM EDT ----- Pls let patient/son know the EEG is normal, proceed with 24-hour EEG as discussed. Thanks

## 2018-02-05 ENCOUNTER — Telehealth: Payer: Self-pay | Admitting: Family Medicine

## 2018-02-05 NOTE — Telephone Encounter (Signed)
Results in result notes.

## 2018-02-05 NOTE — Telephone Encounter (Signed)
Copied from [place] [id number]. Topic: Quick Communication - Lab Results >> Feb 05, 2018  1:26 PM Magdalene Molly, [place] wrote: Called patient to inform them of 02/01/18 lab results. When patient returns call, triage nurse may disclose results. [phone]

## 2018-02-06 LAB — CUP PACEART REMOTE DEVICE CHECK
Date Time Interrogation Session: 20190227231009
MDC IDC PG IMPLANT DT: 20180301

## 2018-02-08 ENCOUNTER — Other Ambulatory Visit (HOSPITAL_COMMUNITY): Payer: Self-pay | Admitting: Internal Medicine

## 2018-02-10 ENCOUNTER — Other Ambulatory Visit: Payer: Medicare Other

## 2018-02-10 ENCOUNTER — Other Ambulatory Visit (HOSPITAL_COMMUNITY): Payer: Self-pay | Admitting: *Deleted

## 2018-02-10 ENCOUNTER — Telehealth: Payer: Self-pay

## 2018-02-10 MED ORDER — TADALAFIL (PAH) 20 MG PO TABS: 40.0000 mg | ORAL_TABLET | Freq: Every day | ORAL | 11 refills | Status: DC

## 2018-02-10 MED FILL — TADALAFIL (PAH) 20 MG TABS: 20 | 10 days supply | Qty: 60 | Fill #0

## 2018-02-10 NOTE — Telephone Encounter (Signed)
LMOM letting pt know that we need to reschedule his ambulatory EEG to tomorrow at 11:30, as Manuela Schwartz will not be available.  Asked for return call to the office.

## 2018-02-11 ENCOUNTER — Telehealth (HOSPITAL_COMMUNITY): Payer: Self-pay | Admitting: *Deleted

## 2018-02-11 ENCOUNTER — Other Ambulatory Visit: Payer: Medicare Other

## 2018-02-11 NOTE — Telephone Encounter (Signed)
Completed PA for pt's Tadalafil, medication approved 02/11/18 through 11/02/98, info faxed to pharmacy

## 2018-02-17 ENCOUNTER — Other Ambulatory Visit: Payer: Self-pay | Admitting: Family Medicine

## 2018-02-18 MED FILL — COLCHICINE 0.6 MG TABS: 0.6 | 10 days supply | Qty: 20 | Fill #0

## 2018-03-03 ENCOUNTER — Ambulatory Visit (INDEPENDENT_AMBULATORY_CARE_PROVIDER_SITE_OTHER): Payer: Medicare Other | Admitting: Neurology

## 2018-03-03 DIAGNOSIS — R569 Unspecified convulsions: Secondary | ICD-10-CM | POA: Diagnosis not present

## 2018-03-03 DIAGNOSIS — R55 Syncope and collapse: Secondary | ICD-10-CM

## 2018-03-08 ENCOUNTER — Ambulatory Visit (INDEPENDENT_AMBULATORY_CARE_PROVIDER_SITE_OTHER): Payer: Medicare Other | Admitting: *Deleted

## 2018-03-08 DIAGNOSIS — R55 Syncope and collapse: Secondary | ICD-10-CM | POA: Diagnosis not present

## 2018-03-08 LAB — CUP PACEART REMOTE DEVICE CHECK
Implantable Pulse Generator Implant Date: 20180301
MDC IDC SESS DTM: 20190401234112

## 2018-03-08 NOTE — Progress Notes (Signed)
[facility] Summary Report / Loop Recorder

## 2018-03-08 NOTE — Procedures (Signed)
ELECTROENCEPHALOGRAM REPORT  Dates of Recording: 03/03/2018 10:29AM to 03/04/2018 11:31AM  Patient's Name: Darren Mueller MRN: [id number] Date of Birth: Apr 01, 1951  Referring Provider: Dr. Ellouise Newer  Procedure: 24-hour ambulatory EEG  History: This is a 67 year old man with recurrent dizziness and syncope, most recent episode with shaking witnessed.  Medications:  PROAIR RESPICLICK 159 (90 Base) MCG/ACT AEPB    ZYLOPRIM 100 MG tablet colchicine 0.6 MG tablet  VIBRA-TABS 100 MG tablet  ADVAIR HFA 230-21 MCG/ACT inhaler  LASIX 40 MG tablet  HYCODAN 5-1.5 MG/5ML syrup  ATROVENT HFA 17 MCG/ACT inhaler  KEPPRA 500 MG tablet  COZAAR 50 MG tablet  OPSUMIT 10 MG TABS  PRILOSEC 40 MG capsule 2 TAMIFLU 75 MG capsule  K-DUR 10 MEQ tablet  CRESTOR 5 MG tablet  UPTRAVI 1000 MCG TABS  ALDACTONE 50 MG tablet  ADCIRCA 20 MG tablet   Technical Summary: This is a 24-hour multichannel digital EEG recording measured by the international 10-20 system with electrodes applied with paste and impedances below 5000 ohms performed as portable with EKG monitoring.  The digital EEG was referentially recorded, reformatted, and digitally filtered in a variety of bipolar and referential montages for optimal display.    DESCRIPTION OF RECORDING: During maximal wakefulness, the background activity consisted of a symmetric 9 Hz posterior dominant rhythm which was reactive to eye opening.  There were no epileptiform discharges or focal slowing seen in wakefulness.  During the recording, the patient progresses through wakefulness, drowsiness, and Stage 2 sleep.  Again, there were no epileptiform discharges seen.  Events: There were no push button events. No episodes reported.   There were no electrographic seizures seen.  EKG lead was unremarkable.  IMPRESSION: This 24-hour ambulatory EEG study is normal.    CLINICAL CORRELATION: A normal EEG does not exclude a clinical diagnosis of epilepsy.  Typical events were not captured. If further clinical questions remain, inpatient video EEG monitoring may be helpful.   Ellouise Newer, M.D.

## 2018-03-11 ENCOUNTER — Telehealth: Payer: Self-pay

## 2018-03-11 MED FILL — levETIRAcetam 500 MG TABS: 500 | 30 days supply | Qty: 60 | Fill #0

## 2018-03-11 NOTE — Telephone Encounter (Signed)
-----  Message from Cameron Sprang, MD sent at 03/10/2018  3:19 PM EDT ----- Pls let him know the brain wave test was normal. Recommend continue Keppra until his follow-up with me in July, thanks

## 2018-03-11 NOTE — Telephone Encounter (Signed)
Spoke with pt relaying message below.

## 2018-03-12 MED FILL — ALLOPURINOL 100 MG TABLET: 100 | 45 days supply | Qty: 90 | Fill #1

## 2018-03-15 NOTE — Progress Notes (Signed)
Subjective:   Darren Mueller is a 67 y.o. male who presents for Medicare Annual/Subsequent preventive examination.  Review of Systems: No ROS.  Medicare Wellness Visit. Additional risk factors are reflected in the social history. Cardiac Risk Factors include: advanced age (>27mn, >>59women);hypertension;male gender Sleep patterns:Has always slept 4-5 hrs at night. Naps daily.  Home Safety/Smoke Alarms: Feels safe in home. Smoke alarms in place.  Living environment; residence and Firearm Safety: Lives with son in 1 story home.    Male:   CCS- last 12/08/13. 5 yr recall PSA-  Lab Results  Component Value Date   PSA 0.49 11/01/2012       Objective:    Vitals: BP 120/62 (BP Location: Left Arm, Patient Position: Sitting, Cuff Size: Normal)   Pulse 75   Ht 6' (1.829 m)   Wt 204 lb 9.6 oz (92.8 kg)   SpO2 97%   BMI 27.75 kg/m   Body mass index is 27.75 kg/m.  Advanced Directives 03/18/2018 11/18/2017 06/02/2017 05/21/2017 05/19/2017 05/06/2017 05/06/2017  Does Patient Have a Medical Advance Directive? Yes No Yes Yes No No No  Type of AParamedicof [place]Living will - [place] will [place]ann[place] Harbor[place] will - - -  Does patient want to make changes to medical advance directive? No - Patient declined - - - - - -  Copy of [place] Proving Ground[place] Chart? No - copy requested - No - copy requested No - copy requested - - -  Would patient like information on creating a medical advance directive? - No - Patient declined No - Patient declined No - Patient declined - - No - Patient declined  Pre-existing out of facility DNR order (yellow form or pink MOST form) - - - - - - -    Tobacco Social History   Tobacco Use  Smoking Status Former Smoker  . Packs/day: 2.00  . Years: 5.00  . Pack years: 10.00  . Types: Cigarettes  . Last attempt to quit: 09/22/1979  . Years since quitting: 38.5  Smokeless Tobacco Never Used       Counseling given: Not Answered   Clinical Intake: Pain : No/denies pain    Past Medical History:  Diagnosis Date  . Alcohol dependence ([place] 03/22/2011   In remission   . Alcohol dependence in remission ([place] 03/22/2011   In remission   . Alcoholic cirrhosis of liver with ascites ([place] 2014  . Dermatitis 06/10/2015  . Diarrhea 09/04/2013  . Elbow pain, left 03/17/2017  . GERD (gastroesophageal reflux disease)   . Gout   . Heart murmur   . Hx of colonic polyps   . Hypertension   . Hypertriglyceridemia 03/17/2017  . Hypopotassemia   . Otalgia 11/28/2013  . Other chronic pulmonary heart diseases   . Preventative health care 06/29/2016  . Red eye 03/03/2016   right  . Renal insufficiency 07/18/2013  . Seizures ([place]   . Sleep apnea    does not wear CPAP  . Thrombocytopenia ([facility] 06/29/2016  . Unspecified hypothyroidism 01/25/2013  . Unspecified pleural effusion    Past Surgical History:  Procedure Laterality Date  . CARDIAC CATHETERIZATION N/A 12/21/2015   Procedure: Right Heart Cath;  Surgeon: DJolaine Artist MD;  [facility];  Service: Cardiovascular;  Laterality: N/A;  . CARDIAC CATHETERIZATION N/A 04/15/2016   Procedure: Right/Left Heart Cath and Coronary Angiography;  Surgeon: DJolaine Artist MD;  [facility];  Service: Cardiovascular;  Laterality: N/A;  . COLONOSCOPY N/A 12/08/2013   Procedure: COLONOSCOPY;  Surgeon: Milus Banister, MD;  [facility];  Service: Endoscopy;  Laterality: N/A;  . ESOPHAGOGASTRODUODENOSCOPY (EGD) WITH PROPOFOL N/A 03/27/2016   Procedure: ESOPHAGOGASTRODUODENOSCOPY (EGD) WITH PROPOFOL;  Surgeon: Milus Banister, MD;  Location: [place];  Service: Endoscopy;  Laterality: N/A;  . LOOP RECORDER INSERTION N/A 01/01/2017   Procedure: Loop Recorder Insertion;  Surgeon: Thompson Grayer, MD;  Location: [place] [facility];  Service: Cardiovascular;  Laterality: N/A;  . RIGHT HEART CATHETERIZATION Right  06/06/2014   Procedure: RIGHT HEART CATH;  Surgeon: Jolaine Artist, MD;  Location: [facility] CATH LAB;  Service: Cardiovascular;  Laterality: Right;  . UVULOPALATOPHARYNGOPLASTY  1999   Family History  Problem Relation Age of Onset  . Heart disease Mother   . Heart attack Mother   . Hypertension Mother   . Kidney failure Mother   . Liver disease Mother        stage 4 liver disease  . Prostate cancer Father   . Cancer Father        lung  . Hypertension Brother   . Gout Brother   . Alcoholism Maternal Grandfather   . Liver disease Maternal Grandfather   . Migraines Sister   . Hypertension Brother   . Colon cancer Neg Hx    Social History   Socioeconomic History  . Marital status: Divorced    Spouse name: Not on file  . Number of children: 3  . Years of education: Not on file  . Highest education level: Not on file  Occupational History  . Occupation: Retired    Comment: Microbiologist  . Financial resource strain: Not on file  . Food insecurity:    Worry: Not on file    Inability: Not on file  . Transportation needs:    Medical: Not on file    Non-medical: Not on file  Tobacco Use  . Smoking status: Former Smoker    Packs/day: 2.00    Years: 5.00    Pack years: 10.00    Types: Cigarettes    Last attempt to quit: 09/22/1979    Years since quitting: 38.5  . Smokeless tobacco: Never Used  Substance and Sexual Activity  . Alcohol use: Yes    Alcohol/week: 3.6 oz    Types: 6 Shots of liquor per week    Comment: social drinker  . Drug use: No  . Sexual activity: Yes    Birth control/protection: Spermicide  Lifestyle  . Physical activity:    Days per week: Not on file    Minutes per session: Not on file  . Stress: Not on file  Relationships  . Social connections:    Talks on phone: Not on file    Gets together: Not on file    Attends religious service: Not on file    Active member of club or organization: Not on file    Attends meetings of clubs or  organizations: Not on file    Relationship status: Not on file  Other Topics Concern  . Not on file  Social History Narrative   0 caffeine drinks daily     Outpatient Encounter Medications as of 03/18/2018  Medication Sig  . Albuterol Sulfate (PROAIR RESPICLICK) 588 (90 Base) MCG/ACT AEPB Inhale 2 puffs into the lungs every 6 (six) hours as needed (SOB or wheezing).  Marland Kitchen allopurinol (ZYLOPRIM) 100 MG tablet Take 2  tablets (200 mg total) by mouth daily.  . colchicine 0.6 MG tablet TAKE 1 TABLET (0.6 MG TOTAL) BY MOUTH 2 (TWO) TIMES DAILY.  . fluticasone-salmeterol (ADVAIR HFA) 230-21 MCG/ACT inhaler Inhale 2 puffs into the lungs every morning.  . furosemide (LASIX) 40 MG tablet TAKE 1 TABLET TWICE A WEEK  . levETIRAcetam (KEPPRA) 500 MG tablet Take 1 tablet (500 mg total) by mouth 2 (two) times daily. (Patient taking differently: Take 500 mg by mouth once. )  . losartan (COZAAR) 50 MG tablet TAKE 1 TABLET DAILY  . Macitentan (OPSUMIT) 10 MG TABS Take 1 tablet (10 mg total) by mouth daily.  Marland Kitchen omeprazole (PRILOSEC) 40 MG capsule TAKE 1 CAPSULE DAILY  . potassium chloride (K-DUR) 10 MEQ tablet Take 20 mEq by mouth daily.   . rosuvastatin (CRESTOR) 5 MG tablet Take 1 tablet (5 mg total) by mouth daily.  . Selexipag (UPTRAVI) 1000 MCG TABS Take 1,000 mcg by mouth 2 (two) times daily.  Marland Kitchen spironolactone (ALDACTONE) 50 MG tablet TAKE 1 TABLET DAILY  . tadalafil, PAH, (ADCIRCA) 20 MG tablet Take 2 tablets (40 mg total) by mouth daily.  Marland Kitchen ipratropium (ATROVENT HFA) 17 MCG/ACT inhaler Inhale 2 puffs into the lungs every 6 (six) hours as needed for wheezing. (Patient not taking: Reported on 03/18/2018)  . [DISCONTINUED] doxycycline (VIBRA-TABS) 100 MG tablet Take 1 tablet (100 mg total) by mouth 2 (two) times daily. Can give caps or generic  . [DISCONTINUED] HYDROcodone-homatropine (HYCODAN) 5-1.5 MG/5ML syrup Take 5 mLs by mouth every 8 (eight) hours as needed.  . [DISCONTINUED] oseltamivir (TAMIFLU) 75  MG capsule Take 1 capsule (75 mg total) by mouth 2 (two) times daily. Pt treated for flu.  . [DISCONTINUED] tadalafil, PAH, (ADCIRCA) 20 MG tablet TAKE 2 TABLETS (40 MG TOTAL) BY MOUTH DAILY   No facility-administered encounter medications on file as of 03/18/2018.     Activities of Daily Living In your present state of health, do you have any difficulty performing the following activities: 03/18/2018 05/06/2017  Hearing? N -  Vision? N -  Comment wears glasses. goes to [place]. New glasses 3 months ago. -  Difficulty concentrating or making decisions? N -  Walking or climbing stairs? N -  Dressing or bathing? N -  Doing errands, shopping? N N  Preparing Food and eating ? N -  Using the Toilet? N -  In the past six months, have you accidently leaked urine? N -  Do you have problems with loss of bowel control? N -  Managing your Medications? N -  Managing your Finances? N -  Housekeeping or managing your Housekeeping? N -  Some recent data might be hidden    Patient Care Team: Mosie Lukes, MD as PCP - General (Family Medicine) Elsie Stain, MD as Consulting Physician (Pulmonary Disease) Bensimhon, Shaune Pascal, MD as Consulting Physician (Cardiology) Wyatt Portela, MD as Consulting Physician (Oncology) Milus Banister, MD as Consulting Physician (Gastroenterology) Gerlene Burdock and [facility] Family [facility] (Dentistry) Cameron Sprang, MD as Consulting Physician (Neurology)   Assessment:   This is a routine wellness examination for [place]. Physical assessment deferred to PCP.  Exercise Activities and Dietary recommendations Current Exercise Habits: The patient does not participate in regular exercise at present, Exercise limited by: cardiac condition(s) Diet (meal preparation, eat out, water intake, caffeinated beverages, dairy products, fruits and vegetables): in general, a "healthy" diet  , well balanced   Goals    . Maintain healthy lifestyle  Fall Risk Fall Risk  03/18/2018  01/25/2018 03/17/2017 06/20/2016 04/28/2016  Falls in the past year? No No Yes Yes No  Number falls in past yr: - - 1 1 -  Comment - - - patient report passing out 2 weeks ago due to dehydration and heart problems. -  Injury with Fall? - - Yes No -   Depression Screen PHQ 2/9 Scores 03/18/2018 03/17/2017 06/20/2016 04/28/2016  PHQ - 2 Score 0 1 0 0  Exception Documentation - - - Patient refusal    Cognitive Function MMSE - Mini Mental State Exam 03/17/2017  Orientation to time 5  Orientation to Place 5  Registration 3  Attention/ Calculation 4  Recall 3  Language- name 2 objects 2  Language- repeat 1  Language- follow 3 step command 3  Language- read & follow direction 1  Write a sentence 1  Copy design 1  Total score 29        Immunization History  Administered Date(s) Administered  . Hepatitis B 12/03/2012, 01/11/2013  . Hepatitis B, ped/adol 06/03/2013  . Influenza Split 08/28/2011, 11/03/2012  . Influenza, High Dose Seasonal PF 09/11/2016, 11/20/2017  . Influenza,inj,Quad PF,6+ Mos 07/18/2013, 07/25/2014, 09/03/2015  . Pneumococcal Conjugate-13 09/03/2015  . Pneumococcal Polysaccharide-23 08/30/2013  . Tdap 09/02/2013    Screening Tests Health Maintenance  Topic Date Due  . INFLUENZA VACCINE  06/03/2018  . PNA vac Low Risk Adult (2 of 2 - PPSV23) 08/30/2018  . TETANUS/TDAP  09/03/2023  . COLONOSCOPY  12/09/2023  . Hepatitis C Screening  Completed      Plan:    Please schedule your next medicare wellness visit with me in 1 yr.  Follow up with Dr.Blyth as scheduled  Continue to eat heart healthy diet (full of fruits, vegetables, whole grains, lean protein, water--limit salt, fat, and sugar intake) and increase physical activity as tolerated.  Continue doing brain stimulating activities (puzzles, reading, adult coloring books, staying active) to keep memory sharp.   Bring a copy of your living will and/or healthcare power of attorney to your next office  visit.   I have personally reviewed and noted the following in the patient's chart:   . Medical and social history . Use of alcohol, tobacco or illicit drugs  . Current medications and supplements . Functional ability and status . Nutritional status . Physical activity . Advanced directives . List of other physicians . Hospitalizations, surgeries, and ER visits in previous 12 months . Vitals . Screenings to include cognitive, depression, and falls . Referrals and appointments  In addition, I have reviewed and discussed with patient certain preventive protocols, quality metrics, and best practice recommendations. A written personalized care plan for preventive services as well as general preventive health recommendations were provided to patient.     Shela Nevin, [place]  03/18/2018

## 2018-03-18 ENCOUNTER — Encounter: Payer: Self-pay | Admitting: Medical

## 2018-03-18 ENCOUNTER — Ambulatory Visit (INDEPENDENT_AMBULATORY_CARE_PROVIDER_SITE_OTHER): Payer: Medicare Other | Admitting: Medical

## 2018-03-18 ENCOUNTER — Ambulatory Visit: Payer: Medicare Other | Admitting: *Deleted

## 2018-03-18 ENCOUNTER — Ambulatory Visit (INDEPENDENT_AMBULATORY_CARE_PROVIDER_SITE_OTHER): Payer: Medicare Other | Admitting: *Deleted

## 2018-03-18 ENCOUNTER — Encounter: Payer: Self-pay | Admitting: *Deleted

## 2018-03-18 VITALS — BP 123/64 | HR 78 | Temp 97.7°F | Resp 16 | Ht 72.0 in | Wt 202.8 lb

## 2018-03-18 VITALS — BP 120/62 | HR 75 | Ht 72.0 in | Wt 204.6 lb

## 2018-03-18 DIAGNOSIS — Z0000 Encounter for general adult medical examination without abnormal findings: Secondary | ICD-10-CM

## 2018-03-18 DIAGNOSIS — H6981 Other specified disorders of Eustachian tube, right ear: Secondary | ICD-10-CM | POA: Diagnosis not present

## 2018-03-18 DIAGNOSIS — H9201 Otalgia, right ear: Secondary | ICD-10-CM | POA: Diagnosis not present

## 2018-03-18 MED ORDER — FLUTICASONE PROPIONATE 50 MCG/ACT NA SUSP: 2.0000 | Freq: Every day | NASAL | 1 refills | Status: DC

## 2018-03-18 MED FILL — FLUTICASONE PROP 50 MCG SPR: 50 | 30 days supply | Qty: 16 | Fill #0

## 2018-03-18 NOTE — Progress Notes (Signed)
Subjective:    Patient ID: Darren Mueller, male    DOB: 1951/08/19, 67 y.o.   MRN: [id number]  HPI  Pt in with some rt ear discomfort on and off for 2 months. Pain sometimes present for about half a day and then resolved. Symptoms at least every other. Slight discomfort most of time. Rare pain reported. Yesterday had symptoms but none today.  Ct of head was negative November 18, 2017.    Review of Systems  Constitutional: Negative for chills, fatigue and fever.  HENT: Negative for congestion, hearing loss, postnasal drip, rhinorrhea, sinus pressure and sinus pain.        Ear discomfort.  Respiratory: Negative for apnea, cough, chest tightness, shortness of breath and wheezing.   Cardiovascular: Negative for chest pain and palpitations.  Gastrointestinal: Negative for abdominal distention, abdominal pain, anal bleeding, constipation, nausea, rectal pain and vomiting.       Hx of mild loose stools for 2 years. He states immodium no longer working.  Neurological: Negative for dizziness, speech difficulty, weakness, numbness and headaches.  Hematological: Negative for adenopathy. Does not bruise/bleed easily.  Psychiatric/Behavioral: Negative for behavioral problems, decreased concentration and sleep disturbance. The patient is not nervous/anxious.      Past Medical History:  Diagnosis Date  . Alcohol dependence ([place]) 03/22/2011   In remission   . Alcohol dependence in remission ([place]) 03/22/2011   In remission   . Alcoholic cirrhosis of liver with ascites ([place]) 2014  . Dermatitis 06/10/2015  . Diarrhea 09/04/2013  . Elbow pain, left 03/17/2017  . GERD (gastroesophageal reflux disease)   . Gout   . Heart murmur   . Hx of colonic polyps   . Hypertension   . Hypertriglyceridemia 03/17/2017  . Hypopotassemia   . Otalgia 11/28/2013  . Other chronic pulmonary heart diseases   . Preventative health care 06/29/2016  . Red eye 03/03/2016   right  . Renal insufficiency 07/18/2013  . Seizures  ([place])   . Sleep apnea    does not wear CPAP  . Thrombocytopenia ([place]) 06/29/2016  . Unspecified hypothyroidism 01/25/2013  . Unspecified pleural effusion      Social History   Socioeconomic History  . Marital status: Divorced    Spouse name: Not on file  . Number of children: 3  . Years of education: Not on file  . Highest education level: Not on file  Occupational History  . Occupation: Retired    Comment: Microbiologist  . Financial resource strain: Not on file  . Food insecurity:    Worry: Not on file    Inability: Not on file  . Transportation needs:    Medical: Not on file    Non-medical: Not on file  Tobacco Use  . Smoking status: Former Smoker    Packs/day: 2.00    Years: 5.00    Pack years: 10.00    Types: Cigarettes    Last attempt to quit: 09/22/1979    Years since quitting: 38.5  . Smokeless tobacco: Never Used  Substance and Sexual Activity  . Alcohol use: Yes    Alcohol/week: 3.6 oz    Types: 6 Shots of liquor per week    Comment: social drinker  . Drug use: No  . Sexual activity: Yes    Birth control/protection: Spermicide  Lifestyle  . Physical activity:    Days per week: Not on file    Minutes per session: Not on file  . Stress: Not on file  Relationships  . Social connections:    Talks on phone: Not on file    Gets together: Not on file    Attends religious service: Not on file    Active member of club or organization: Not on file    Attends meetings of clubs or organizations: Not on file    Relationship status: Not on file  . Intimate partner violence:    Fear of current or ex partner: Not on file    Emotionally abused: Not on file    Physically abused: Not on file    Forced sexual activity: Not on file  Other Topics Concern  . Not on file  Social History Narrative   0 caffeine drinks daily     Past Surgical History:  Procedure Laterality Date  . CARDIAC CATHETERIZATION N/A 12/21/2015   Procedure: Right Heart Cath;   Surgeon: Jolaine Artist, MD;  Location: [place] [facility];  Service: Cardiovascular;  Laterality: N/A;  . CARDIAC CATHETERIZATION N/A 04/15/2016   Procedure: Right/Left Heart Cath and Coronary Angiography;  Surgeon: Jolaine Artist, MD;  Location: [place] [facility];  Service: Cardiovascular;  Laterality: N/A;  . COLONOSCOPY N/A 12/08/2013   Procedure: COLONOSCOPY;  Surgeon: Milus Banister, MD;  [facility];  Service: Endoscopy;  Laterality: N/A;  . ESOPHAGOGASTRODUODENOSCOPY (EGD) WITH PROPOFOL N/A 03/27/2016   Procedure: ESOPHAGOGASTRODUODENOSCOPY (EGD) WITH PROPOFOL;  Surgeon: Milus Banister, MD;  Location: [place];  Service: Endoscopy;  Laterality: N/A;  . LOOP RECORDER INSERTION N/A 01/01/2017   Procedure: Loop Recorder Insertion;  Surgeon: Thompson Grayer, MD;  Location: [place] [facility];  Service: Cardiovascular;  Laterality: N/A;  . RIGHT HEART CATHETERIZATION Right 06/06/2014   Procedure: RIGHT HEART CATH;  Surgeon: Jolaine Artist, MD;  Location: [facility] CATH LAB;  Service: Cardiovascular;  Laterality: Right;  . UVULOPALATOPHARYNGOPLASTY  1999    Family History  Problem Relation Age of Onset  . Heart disease Mother   . Heart attack Mother   . Hypertension Mother   . Kidney failure Mother   . Liver disease Mother        stage 4 liver disease  . Prostate cancer Father   . Cancer Father        lung  . Hypertension Brother   . Gout Brother   . Alcoholism Maternal Grandfather   . Liver disease Maternal Grandfather   . Migraines Sister   . Hypertension Brother   . Colon cancer Neg Hx     Allergies  Allergen Reactions  . Aspirin Other (See Comments)    hypertension  . Nitroglycerin     Headache    Current Outpatient Medications on File Prior to Visit  Medication Sig Dispense Refill  . Albuterol Sulfate (PROAIR RESPICLICK) 960 (90 Base) MCG/ACT AEPB Inhale 2 puffs into the lungs every 6 (six) hours as needed (SOB or wheezing).    Marland Kitchen allopurinol (ZYLOPRIM)  100 MG tablet Take 2 tablets (200 mg total) by mouth daily. 90 tablet 1  . colchicine 0.6 MG tablet TAKE 1 TABLET (0.6 MG TOTAL) BY MOUTH 2 (TWO) TIMES DAILY. 20 tablet 1  . fluticasone-salmeterol (ADVAIR HFA) 230-21 MCG/ACT inhaler Inhale 2 puffs into the lungs every morning.    . furosemide (LASIX) 40 MG tablet TAKE 1 TABLET TWICE A WEEK 180 tablet 2  . ipratropium (ATROVENT HFA) 17 MCG/ACT inhaler Inhale 2 puffs into the lungs every 6 (six) hours as needed for wheezing. 1 Inhaler 12  . levETIRAcetam (  KEPPRA) 500 MG tablet Take 1 tablet (500 mg total) by mouth 2 (two) times daily. (Patient taking differently: Take 500 mg by mouth once. ) 60 tablet 3  . losartan (COZAAR) 50 MG tablet TAKE 1 TABLET DAILY 90 tablet 2  . Macitentan (OPSUMIT) 10 MG TABS Take 1 tablet (10 mg total) by mouth daily. 30 tablet 11  . omeprazole (PRILOSEC) 40 MG capsule TAKE 1 CAPSULE DAILY 90 capsule 2  . potassium chloride (K-DUR) 10 MEQ tablet Take 20 mEq by mouth daily.     . rosuvastatin (CRESTOR) 5 MG tablet Take 1 tablet (5 mg total) by mouth daily. 90 tablet 1  . Selexipag (UPTRAVI) 1000 MCG TABS Take 1,000 mcg by mouth 2 (two) times daily. 60 tablet 11  . spironolactone (ALDACTONE) 50 MG tablet TAKE 1 TABLET DAILY 90 tablet 1  . SYMAX-SR 0.375 MG 12 hr tablet     . tadalafil, PAH, (ADCIRCA) 20 MG tablet Take 2 tablets (40 mg total) by mouth daily. 60 tablet 11   No current facility-administered medications on file prior to visit.     BP 123/64 (BP Location: Right Arm, Patient Position: Sitting, Cuff Size: Normal)   Pulse 78   Temp 97.7 F (36.5 C) (Oral)   Resp 16   Ht 6' (1.829 m)   Wt 202 lb 12.8 oz (92 kg)   SpO2 98%   BMI 27.50 kg/m       Objective:   Physical Exam  General  Mental Status - Alert. General Appearance - Well groomed. Not in acute distress.  Skin Rashes- No Rashes.  HEENT Head- Normal. Ear Auditory Canal - Left- Normal. Right - Normal.Tympanic Membrane- Left- Normal.  Right- Normal. Eye Sclera/Conjunctiva- Left- Normal. Right- Normal. Nose & Sinuses Nasal Mucosa- Left-  Boggy and Congested. Right-  Boggy and  Congested.Bilateral no  maxillary and no  frontal sinus pressure. Mouth & Throat Lips: Upper Lip- Normal: no dryness, cracking, pallor, cyanosis, or vesicular eruption. Lower Lip-Normal: no dryness, cracking, pallor, cyanosis or vesicular eruption. Buccal Mucosa- Bilateral- No Aphthous ulcers. Oropharynx- No Discharge or Erythema. Tonsils: Characteristics- Bilateral- No Erythema or Congestion. Size/Enlargement- Bilateral- No enlargement. Discharge- bilateral-None.  Neck Neck- Supple. No Masses.   Chest and Lung Exam Auscultation: Breath Sounds:-Clear even and unlabored.  Cardiovascular Auscultation:Rythm- Regular, rate and rhythm. Murmurs & Other Heart Sounds:Ausculatation of the heart reveal- No Murmurs.  Lymphatic Head & Neck General Head & Neck Lymphatics: Bilateral: Description- No Localized lymphadenopathy.  Neuro- CN III- XII grossly intact.        Assessment & Plan:  I think your right ear discomfort is likely eustachian tube dysfunction.  The ear canal does not look swollen, no wax present and the tympanic membrane looks normal.  Your nose does look a little swollen inside nostrils indicating possible allergies.  Eustachian tube dysfunction can be associated with allergies or respiratory infections.  Will prescribe Flonase nasal spray.  I want you to use it daily and update me in 2 weeks on how you are doing.  If your symptoms worsen or change during the interim contact me sooner.  Regarding your chronic intermittent loose stools for years, I want you to take symax twice daily.  You can continue to use Imodium if needed.  If this is not adequate then let me know and I could get advice from your PCP regarding other potential treatment.  Follow-up in 2 weeks or as needed.  Mackie Pai, PA-C

## 2018-03-18 NOTE — Patient Instructions (Addendum)
I think your right ear discomfort is likely eustachian tube dysfunction.  The ear canal does not look swollen, no wax present and the tympanic membrane looks normal.  Your nose does look a little swollen inside nostrils  indicating possible allergies.  Eustachian tube dysfunction can be associated with allergies or respiratory infections.  Will prescribe Flonase nasal spray.  I want you to use it daily and update me in 2 weeks on how you are doing.  If your symptoms worsen or change during the interim contact me sooner.  Regarding your chronic intermittent loose stools for years, I want you to take symax twice daily.  You can continue to use Imodium if needed.  If this is not adequate then let me know and I could get advice from your PCP regarding other potential treatment.  Follow-up in 2 weeks or as needed.

## 2018-03-18 NOTE — Patient Instructions (Addendum)
Please schedule your next medicare wellness visit with me in 1 yr.  Follow up with Dr.Blyth as scheduled  Continue to eat heart healthy diet (full of fruits, vegetables, whole grains, lean protein, water--limit salt, fat, and sugar intake) and increase physical activity as tolerated.  Continue doing brain stimulating activities (puzzles, reading, adult coloring books, staying active) to keep memory sharp.   Bring a copy of your living will and/or healthcare power of attorney to your next office visit.   Darren Mueller , Thank you for taking time to come for your Medicare Wellness Visit. I appreciate your ongoing commitment to your health goals. Please review the following plan we discussed and let me know if I can assist you in the future.   These are the goals we discussed: Goals    . Maintain healthy lifestyle       This is a list of the screening recommended for you and due dates:  Health Maintenance  Topic Date Due  . Flu Shot  06/03/2018  . Pneumonia vaccines (2 of 2 - PPSV23) 08/30/2018  . Tetanus Vaccine  09/03/2023  . Colon Cancer Screening  12/09/2023  .  Hepatitis C: One time screening is recommended by Center for Disease Control  (CDC) for  adults born from [birth year] through 1966.   Completed    Health Maintenance, Male A healthy lifestyle and preventive care is important for your health and wellness. Ask your health care provider about what schedule of regular examinations is right for you. What should I know about weight and diet? Eat a Healthy Diet  Eat plenty of vegetables, fruits, whole grains, low-fat dairy products, and lean protein.  Do not eat a lot of foods high in solid fats, added sugars, or salt.  Maintain a Healthy Weight Regular exercise can help you achieve or maintain a healthy weight. You should:  Do at least 150 minutes of exercise each week. The exercise should increase your heart rate and make you sweat (moderate-intensity exercise).  Do  strength-training exercises at least twice a week.  Watch Your Levels of Cholesterol and Blood Lipids  Have your blood tested for lipids and cholesterol every 5 years starting at 68 years of age. If you are at high risk for heart disease, you should start having your blood tested when you are 68 years old. You may need to have your cholesterol levels checked more often if: ? Your lipid or cholesterol levels are high. ? You are older than 68 years of age. ? You are at high risk for heart disease.  What should I know about cancer screening? Many types of cancers can be detected early and may often be prevented. Lung Cancer  You should be screened every year for lung cancer if: ? You are a current smoker who has smoked for at least 30 years. ? You are a former smoker who has quit within the past 15 years.  Talk to your health care provider about your screening options, when you should start screening, and how often you should be screened.  Colorectal Cancer  Routine colorectal cancer screening usually begins at 68 years of age and should be repeated every 5-10 years until you are 68 years old. You may need to be screened more often if early forms of precancerous polyps or small growths are found. Your health care provider may recommend screening at an earlier age if you have risk factors for colon cancer.  Your health care provider may recommend using home  test kits to check for hidden blood in the stool.  A small camera at the end of a tube can be used to examine your colon (sigmoidoscopy or colonoscopy). This checks for the earliest forms of colorectal cancer.  Prostate and Testicular Cancer  Depending on your age and overall health, your health care provider may do certain tests to screen for prostate and testicular cancer.  Talk to your health care provider about any symptoms or concerns you have about testicular or prostate cancer.  Skin Cancer  Check your skin from head to toe  regularly.  Tell your health care provider about any new moles or changes in moles, especially if: ? There is a change in a mole's size, shape, or color. ? You have a mole that is larger than a pencil eraser.  Always use sunscreen. Apply sunscreen liberally and repeat throughout the day.  Protect yourself by wearing long sleeves, pants, a wide-brimmed hat, and sunglasses when outside.  What should I know about heart disease, diabetes, and high blood pressure?  If you are 33-12 years of age, have your blood pressure checked every 3-5 years. If you are 1 years of age or older, have your blood pressure checked every year. You should have your blood pressure measured twice-once when you are at a hospital or clinic, and once when you are not at a hospital or clinic. Record the average of the two measurements. To check your blood pressure when you are not at a hospital or clinic, you can use: ? An automated blood pressure machine at a pharmacy. ? A home blood pressure monitor.  Talk to your health care provider about your target blood pressure.  If you are between 66-80 years old, ask your health care provider if you should take aspirin to prevent heart disease.  Have regular diabetes screenings by checking your fasting blood sugar level. ? If you are at a normal weight and have a low risk for diabetes, have this test once every three years after the age of 44. ? If you are overweight and have a high risk for diabetes, consider being tested at a younger age or more often.  A one-time screening for abdominal aortic aneurysm (AAA) by ultrasound is recommended for men aged 67-75 years who are current or former smokers. What should I know about preventing infection? Hepatitis B If you have a higher risk for hepatitis B, you should be screened for this virus. Talk with your health care provider to find out if you are at risk for hepatitis B infection. Hepatitis C Blood testing is recommended  for:  Everyone born from [birth year] through 1966.  Anyone with known risk factors for hepatitis C.  Sexually Transmitted Diseases (STDs)  You should be screened each year for STDs including gonorrhea and chlamydia if: ? You are sexually active and are younger than 68 years of age. ? You are older than 68 years of age and your health care provider tells you that you are at risk for this type of infection. ? Your sexual activity has changed since you were last screened and you are at an increased risk for chlamydia or gonorrhea. Ask your health care provider if you are at risk.  Talk with your health care provider about whether you are at high risk of being infected with HIV. Your health care provider may recommend a prescription medicine to help prevent HIV infection.  What else can I do?  Schedule regular health, dental, and eye exams.  Stay current with your vaccines (immunizations).  Do not use any tobacco products, such as cigarettes, chewing tobacco, and e-cigarettes. If you need help quitting, ask your health care provider.  Limit alcohol intake to no more than 2 drinks per day. One drink equals 12 ounces of beer, 5 ounces of wine, or 1 ounces of hard liquor.  Do not use street drugs.  Do not share needles.  Ask your health care provider for help if you need support or information about quitting drugs.  Tell your health care provider if you often feel depressed.  Tell your health care provider if you have ever been abused or do not feel safe at home. This information is not intended to replace advice given to you by your health care provider. Make sure you discuss any questions you have with your health care provider. Document Released: 04/17/2008 Document Revised: 06/18/2016 Document Reviewed: 07/24/2015 Elsevier Interactive Patient Education  [employer].

## 2018-03-30 LAB — CUP PACEART REMOTE DEVICE CHECK
Date Time Interrogation Session: 20190505000941
Implantable Pulse Generator Implant Date: 20180301

## 2018-04-08 ENCOUNTER — Ambulatory Visit (INDEPENDENT_AMBULATORY_CARE_PROVIDER_SITE_OTHER): Payer: Medicare Other | Admitting: *Deleted

## 2018-04-08 DIAGNOSIS — R55 Syncope and collapse: Secondary | ICD-10-CM

## 2018-04-09 NOTE — Progress Notes (Signed)
Carelink Summary Report / Loop Recorder 

## 2018-04-20 ENCOUNTER — Encounter (HOSPITAL_COMMUNITY): Payer: Medicare Other | Admitting: Internal Medicine

## 2018-04-21 ENCOUNTER — Other Ambulatory Visit: Payer: Self-pay

## 2018-04-21 ENCOUNTER — Ambulatory Visit (HOSPITAL_COMMUNITY)
Admission: RE | Admit: 2018-04-21 | Discharge: 2018-04-21 | Disposition: A | Discharge status: Home / Self Care | Payer: Medicare Other | Source: Ambulatory Visit | Attending: Internal Medicine | Admitting: Internal Medicine

## 2018-04-21 VITALS — BP 120/76 | HR 76 | Wt 207.8 lb

## 2018-04-21 DIAGNOSIS — I5033 Acute on chronic diastolic (congestive) heart failure: Secondary | ICD-10-CM | POA: Insufficient documentation

## 2018-04-21 DIAGNOSIS — I5032 Chronic diastolic (congestive) heart failure: Secondary | ICD-10-CM | POA: Insufficient documentation

## 2018-04-21 DIAGNOSIS — Z7951 Long term (current) use of inhaled steroids: Secondary | ICD-10-CM | POA: Diagnosis not present

## 2018-04-21 DIAGNOSIS — G4733 Obstructive sleep apnea (adult) (pediatric): Secondary | ICD-10-CM | POA: Insufficient documentation

## 2018-04-21 DIAGNOSIS — K766 Portal hypertension: Secondary | ICD-10-CM

## 2018-04-21 DIAGNOSIS — R569 Unspecified convulsions: Secondary | ICD-10-CM | POA: Diagnosis not present

## 2018-04-21 DIAGNOSIS — I2721 Secondary pulmonary arterial hypertension: Secondary | ICD-10-CM

## 2018-04-21 DIAGNOSIS — J449 Chronic obstructive pulmonary disease, unspecified: Secondary | ICD-10-CM | POA: Insufficient documentation

## 2018-04-21 DIAGNOSIS — K7030 Alcoholic cirrhosis of liver without ascites: Secondary | ICD-10-CM | POA: Insufficient documentation

## 2018-04-21 DIAGNOSIS — I2510 Atherosclerotic heart disease of native coronary artery without angina pectoris: Secondary | ICD-10-CM | POA: Insufficient documentation

## 2018-04-21 DIAGNOSIS — D61818 Other pancytopenia: Secondary | ICD-10-CM | POA: Insufficient documentation

## 2018-04-21 DIAGNOSIS — N183 Chronic kidney disease, stage 3 unspecified: Secondary | ICD-10-CM

## 2018-04-21 DIAGNOSIS — Z79899 Other long term (current) drug therapy: Secondary | ICD-10-CM | POA: Diagnosis not present

## 2018-04-21 DIAGNOSIS — I130 Hypertensive heart and chronic kidney disease with heart failure and stage 1 through stage 4 chronic kidney disease, or unspecified chronic kidney disease: Secondary | ICD-10-CM | POA: Insufficient documentation

## 2018-04-21 MED ORDER — FUROSEMIDE 40 MG PO TABS: 40.0000 mg | ORAL_TABLET | ORAL | 2 refills | Status: DC

## 2018-04-21 NOTE — Progress Notes (Signed)
ADVANCED HF CLINIC NOTE  Patient ID: Darren Mueller, male   DOB: 1951-08-31, 67 y.o.   MRN: [id number] GI : Darren Mueller PCP: Darren Mueller  HPI:  Darren Mueller ("Darren Mueller") is a 67 yo with history of COPD, portopulmonary hypertension with RV failure, and ETOH cirrhosis.  Admitted in 6/17 with CP and SOB. Was diuresed. Troponins normal. Underwent R/L cath. Minimal CAD with moderate PAH and preserved cardiac output.   Admitted 06/2016 and 07/2016 with syncope.  On September admission found to have elevated ETOH level as well as R 6th rib fracture.   Wore 30 day monitory up until 08/28/16. No AF noted. No events.  9% tachycardia.   Admitted Darren end of February after syncopal event. LINQ placed.   Admitted 7/4 through 05/08/17 with volume depletion. Diuretics held and changed to as needed for weight 200 pounds or greater. Discharge weight was 194 pounds.   Saw PCP for flu follow up on 12/18/17. Creatinine increased 1.7 > 2.5 at Darren time, so lasix was held with deferment to HF follow up appointment today. Arlyce Harman was not held.  Presents today for HF follow up. Over past few weeks has been more SOB and has noticed increased more LE edema. Weight up 5 pounds. Watching diet and fluid intake closely. Has not taken any extra fluid pills. Taking lasix 15m 2x/week Taking all meds as prescribed. Says Keppra makes him tired (Following with Darren. ADelice Leschfor seizures).   PAH meds 1) Macitnentan 10 mg daily 2) Adcirca 40 mg daily  3) Selexipeg  1000/1200 daily  Studies: ECHO 12/14 EF 55-60% Peak PA pressure 35. Severe RV dysfunction  ECHO 7/15 EF 60% RV moderately to severely dilated. Moderate HK RVSP 522mHG  ECHO 6/16 EF 60% RV moderately to severely dilated. Severe HK RVSP ~65 mm HG. D-shaped septum Echo 2/17 LVEF 60-65% RV massively dilated. Flat septum. Severe HK. Moderate TR RVSP ~65. IVC small. No effusion  ECHO 01/01/2017 EF 50-55% Grade I DD. RV severely dilated. Peak PA pressure 62 mm hg  Cath 6/17 Mid  RCA lesion, 20% stenosed. Dist LAD lesion, 20% stenosed. Ao = 107/70 (87) LV = 106/3/11 RA = 4 RV = 74/11 PA = 74/26 (45) PCW = 6 Fick cardiac output/index = 4.6.2.2 PVR = 8.5 Ao sat = 95% PA sat = 61%, 58%  RHC 2/17 RA = 11 RV = 80/16/19 PA = 83/61 (71) PCW = 9 Fick cardiac output/index = 5.6/2.6 PVR = 10.3 WU Ao sat = 94% PA sat = 65%, 68% High SVC sat = 65%  RHC 2/14: RA = 13  RV = 63/5/14  PA = 65/24 (41)  PCW = 6  Hepatic wedge = 21  Fick cardiac output/index = 3.8/1.8  PVR = 9.1  FA sat = 94%  PA sat = 58%, 56%  SVC = 54%  RHC 06/06/14 RA = 5  RV = 73/5/9  PA = 81/25 (46)  PCW = 7  Fick cardiac output/index = 6.0/2.8  PVR = 6.5 Woods  FA sat = 95%  PA sat = 71%, 67%  Unable to get hepatic wedge  PFTs 3/14 FEV1 2.02 L (58%) FVC 2.7L (54%) FEV1/FVC (89%) DLCO 53%  PFTs 1/17 FEV1 2.05 L (61%) FVC 2.48L (56%) DLCO 44%  Event Monitor - NSR 10/26/13.   Ab u/s 8/16: + cirrhosis/mild to moderate splenomegaly  6 min walk 01/10/13, 1290 feet  6MW (01/10/14) = 1280 feet (380 m) 6MW (9/15) = 1350 feet (411 m)  O2 sats ranged from 89-95% on room air, HR ranged from 100-129. 6MW (6/16) = 384 meters  6MW (2/17) = 1250 feet (372m 6MW (8/17) = 1320 feet      Current Outpatient Medications on File Prior to Encounter  Medication Sig Dispense Refill  . Albuterol Sulfate (PROAIR RESPICLICK) 1245(90 Base) MCG/ACT AEPB Inhale 2 puffs into Darren lungs every 6 (six) hours as needed (SOB or wheezing).    .Marland Kitchenallopurinol (ZYLOPRIM) 100 MG tablet Take 2 tablets (200 mg total) by mouth daily. 90 tablet 1  . colchicine 0.6 MG tablet TAKE 1 TABLET (0.6 MG TOTAL) BY MOUTH 2 (TWO) TIMES DAILY. 20 tablet 1  . fluticasone (FLONASE) 50 MCG/ACT nasal spray Place 2 sprays into both nostrils daily. 16 g 1  . fluticasone-salmeterol (ADVAIR HFA) 230-21 MCG/ACT inhaler Inhale 2 puffs into Darren lungs every morning.    . furosemide (LASIX) 40 MG tablet TAKE 1 TABLET TWICE A WEEK 180  tablet 2  . ipratropium (ATROVENT HFA) 17 MCG/ACT inhaler Inhale 2 puffs into Darren lungs every 6 (six) hours as needed for wheezing. 1 Inhaler 12  . levETIRAcetam (KEPPRA) 500 MG tablet Take 1 tablet (500 mg total) by mouth 2 (two) times daily. (Patient taking differently: Take 500 mg by mouth once. ) 60 tablet 3  . losartan (COZAAR) 50 MG tablet TAKE 1 TABLET DAILY 90 tablet 2  . Macitentan (OPSUMIT) 10 MG TABS Take 1 tablet (10 mg total) by mouth daily. 30 tablet 11  . omeprazole (PRILOSEC) 40 MG capsule TAKE 1 CAPSULE DAILY 90 capsule 2  . potassium chloride (K-DUR) 10 MEQ tablet Take 20 mEq by mouth daily.     . rosuvastatin (CRESTOR) 5 MG tablet Take 1 tablet (5 mg total) by mouth daily. 90 tablet 1  . Selexipag (UPTRAVI) 1000 MCG TABS Take 1,000 mcg by mouth 2 (two) times daily. 60 tablet 11  . spironolactone (ALDACTONE) 50 MG tablet TAKE 1 TABLET DAILY 90 tablet 1  . SYMAX-SR 0.375 MG 12 hr tablet     . tadalafil, PAH, (ADCIRCA) 20 MG tablet Take 2 tablets (40 mg total) by mouth daily. 60 tablet 11   No current facility-administered medications on file prior to encounter.     Objective:   Weight Range:  Vital Signs:   Pulse Rate:  [76] 76 (06/19 1049) BP: (120)/(76) 120/76 (06/19 1049) SpO2:  [97 %] 97 % (06/19 1049) Weight:  [207 lb 12.8 oz (94.3 kg)] 207 lb 12.8 oz (94.3 kg) (06/19 1049)       Wt Readings from Last 3 Encounters:  04/21/18 207 lb 12.8 oz (94.3 kg)  03/18/18 202 lb 12.8 oz (92 kg)  03/18/18 204 lb 9.6 oz (92.8 kg)    Physical Exam: General:  Well appearing. No resp difficulty HEENT: normal Neck: supple. JVP 7-8 Carotids 2+ bilat; no bruits. No lymphadenopathy or thryomegaly appreciated. Cor: PMI nondisplaced. Regular rate & rhythm. RV lift 2/6 TR increased P2 Lungs: clear Abdomen: soft, nontender, nondistended. No hepatosplenomegaly. No bruits or masses. Good bowel sounds. Extremities: no cyanosis, clubbing, rash, 2+ edema Neuro: alert & orientedx3,  cranial nerves grossly intact. moves all 4 extremities w/o difficulty. Affect pleasant   Assessment/Plan    1. PAH: Suspected portopulmonary HTN in setting of ETOH cirrhosis. - Stable NYHA III.  - Volume status elevated today. Will have him take lasix for Darren next 3 days. Cahnge standing lasix to 463mMWF - Continue selexipag 1000 mcg twice a day. Intolerant  higher dose  due to diarrhea and headaches .  - Continue macitentan 10 daily and adcirca 40 mg daily, - See back in 3 months with echo and 6MW - Discussed need for complete ETOH cessation.  2. CKD III:  - Creatinine elevated in 2/19  2.45. Lasix held by PCP. Spiro and potassium continued. Creatinine 1.45 on 02/01/18 - Adjust lasix as above Recheck BMET today and in 3 weeks 3. Cirrhosis:  - Likely combination of RV failure and ETOH - Follows with Darren. Ardis Mueller.  4. Recurrent syncope/seizure - by history event sounds like a seizure but need to make sure not cardiac source. Seen in ED 1/15. ETOH level high,. CT brain ok.  - LINQ interrogated personally and no arrhythmias and thus most likely seizure - Continue Keppra. Follows with Darren. Delice Mueller 5. HTN:  - Blood pressure well controlled. Continue current regimen. 6. OSA:  - Has mild OSA with AHI 12 and desats down to 82%.  - Intolerant CPAP.  7. Pancytopenia:  - Has seen Darren. Alen Blew in Hematology - felt like it is due to splenic sequestration.  8. A/C Diastolic Heart Failure - Volume status elevated. Change lasix as above.   Glori Bickers, MD  10:59 AM

## 2018-04-21 NOTE — Patient Instructions (Signed)
Increase Furosemide (Lasix) to 40 mg daily FOR 3 DAYS ONLY, then take it every Monday, Wed and Friday  Labs in 2-3 weeks at your Primary Care Provider  Your physician recommends that you schedule a follow-up appointment in: 3 months with echocardiogram

## 2018-04-21 NOTE — Addendum Note (Signed)
Encounter addended by: Scarlette Calico, RN on: 04/21/2018 11:16 AM  Actions taken: Visit diagnoses modified, Order list changed, Diagnosis association updated, Sign clinical note

## 2018-04-22 ENCOUNTER — Telehealth: Payer: Self-pay | Admitting: *Deleted

## 2018-04-22 NOTE — Telephone Encounter (Signed)
Patient brought in Rx slip with lab orders from Dr. Bensimhon/Cardiology for BMET to be drawn in 2-3 weeks; forwarded to provider/SLS 06/20

## 2018-05-04 ENCOUNTER — Telehealth: Payer: Self-pay | Admitting: *Deleted

## 2018-05-04 ENCOUNTER — Telehealth: Payer: Self-pay | Admitting: Family Medicine

## 2018-05-04 ENCOUNTER — Ambulatory Visit (HOSPITAL_BASED_OUTPATIENT_CLINIC_OR_DEPARTMENT_OTHER)
Admission: RE | Admit: 2018-05-04 | Discharge: 2018-05-04 | Disposition: A | Discharge status: Home / Self Care | Payer: Medicare Other | Source: Ambulatory Visit | Attending: Family Medicine | Admitting: Family Medicine

## 2018-05-04 ENCOUNTER — Ambulatory Visit (INDEPENDENT_AMBULATORY_CARE_PROVIDER_SITE_OTHER): Payer: Medicare Other | Admitting: Family Medicine

## 2018-05-04 VITALS — BP 122/58 | HR 87 | Temp 97.7°F | Resp 18 | Ht 72.0 in | Wt 197.2 lb

## 2018-05-04 DIAGNOSIS — I700 Atherosclerosis of aorta: Secondary | ICD-10-CM | POA: Diagnosis not present

## 2018-05-04 DIAGNOSIS — I10 Essential (primary) hypertension: Secondary | ICD-10-CM

## 2018-05-04 DIAGNOSIS — M5441 Lumbago with sciatica, right side: Secondary | ICD-10-CM

## 2018-05-04 DIAGNOSIS — D696 Thrombocytopenia, unspecified: Secondary | ICD-10-CM

## 2018-05-04 DIAGNOSIS — E785 Hyperlipidemia, unspecified: Secondary | ICD-10-CM | POA: Diagnosis not present

## 2018-05-04 DIAGNOSIS — R197 Diarrhea, unspecified: Secondary | ICD-10-CM | POA: Diagnosis not present

## 2018-05-04 DIAGNOSIS — M1A9XX0 Chronic gout, unspecified, without tophus (tophi): Secondary | ICD-10-CM

## 2018-05-04 DIAGNOSIS — M545 Low back pain, unspecified: Secondary | ICD-10-CM

## 2018-05-04 DIAGNOSIS — M5442 Lumbago with sciatica, left side: Secondary | ICD-10-CM | POA: Diagnosis not present

## 2018-05-04 DIAGNOSIS — E039 Hypothyroidism, unspecified: Secondary | ICD-10-CM

## 2018-05-04 DIAGNOSIS — G40909 Epilepsy, unspecified, not intractable, without status epilepticus: Secondary | ICD-10-CM | POA: Diagnosis not present

## 2018-05-04 DIAGNOSIS — H6593 Unspecified nonsuppurative otitis media, bilateral: Secondary | ICD-10-CM

## 2018-05-04 DIAGNOSIS — R739 Hyperglycemia, unspecified: Secondary | ICD-10-CM | POA: Diagnosis not present

## 2018-05-04 LAB — CBC
HEMATOCRIT: 35.8 % — AB (ref 39.0–52.0)
Hemoglobin: 11.9 g/dL — ABNORMAL LOW (ref 13.0–17.0)
MCHC: 33.1 g/dL (ref 30.0–36.0)
MCV: 109.9 fl — AB (ref 78.0–100.0)
Platelets: 32 10*3/uL — CL (ref 150.0–400.0)
RBC: 3.26 Mil/uL — AB (ref 4.22–5.81)
RDW: 14.7 % (ref 11.5–15.5)
WBC: 1.8 10*3/uL — CL (ref 4.0–10.5)

## 2018-05-04 LAB — COMPREHENSIVE METABOLIC PANEL
ALBUMIN: 4 g/dL (ref 3.5–5.2)
ALT: 22 U/L (ref 0–53)
AST: 67 U/L — AB (ref 0–37)
Alkaline Phosphatase: 225 U/L — ABNORMAL HIGH (ref 39–117)
BUN: 34 mg/dL — AB (ref 6–23)
CHLORIDE: 106 meq/L (ref 96–112)
CO2: 22 mEq/L (ref 19–32)
CREATININE: 2.18 mg/dL — AB (ref 0.40–1.50)
Calcium: 8.8 mg/dL (ref 8.4–10.5)
GFR: 38.85 mL/min — ABNORMAL LOW (ref >60.00)
GLUCOSE: 108 mg/dL — AB (ref 70–99)
POTASSIUM: 4.5 meq/L (ref 3.5–5.1)
SODIUM: 135 meq/L (ref 135–145)
Total Bilirubin: 1.4 mg/dL — ABNORMAL HIGH (ref 0.2–1.2)
Total Protein: 7.2 g/dL (ref 6.0–8.3)

## 2018-05-04 LAB — LDL CHOLESTEROL, DIRECT: LDL DIRECT: 34 mg/dL

## 2018-05-04 LAB — LIPID PANEL
CHOLESTEROL: 122 mg/dL (ref 0–200)
HDL: 51.3 mg/dL (ref >39.00)
NONHDL: 70.89
Total CHOL/HDL Ratio: 2
Triglycerides: 333 mg/dL — ABNORMAL HIGH (ref 0.0–149.0)
VLDL: 66.6 mg/dL — AB (ref 0.0–40.0)

## 2018-05-04 LAB — TSH: TSH: 3.15 u[IU]/mL (ref 0.35–4.50)

## 2018-05-04 LAB — HEMOGLOBIN A1C: Hgb A1c MFr Bld: 5.1 % (ref 4.6–6.5)

## 2018-05-04 LAB — URIC ACID: URIC ACID, SERUM: 3.5 mg/dL — AB (ref 4.0–7.8)

## 2018-05-04 MED ORDER — CHOLESTYRAMINE 4 G PO PACK: 4.0000 g | PACK | Freq: Two times a day (BID) | ORAL | 12 refills | Status: DC

## 2018-05-04 MED ORDER — ALLOPURINOL 100 MG PO TABS: 200.0000 mg | ORAL_TABLET | Freq: Every day | ORAL | 1 refills | Status: DC

## 2018-05-04 MED ORDER — TIZANIDINE HCL 2 MG PO TABS: 1.0000 mg | ORAL_TABLET | Freq: Four times a day (QID) | ORAL | 0 refills | Status: DC | PRN

## 2018-05-04 MED FILL — CHOLESTYRAMINE PACKET: 4 | 30 days supply | Qty: 60 | Fill #0

## 2018-05-04 MED FILL — tiZANidine HCL 2 MG TABS: 2 | 4 days supply | Qty: 30 | Fill #0

## 2018-05-04 MED FILL — ALLOPURINOL 100 MG TABLET: 100 | 90 days supply | Qty: 180 | Fill #0

## 2018-05-04 NOTE — Assessment & Plan Note (Signed)
Add Cholestyramine

## 2018-05-04 NOTE — Assessment & Plan Note (Signed)
Encouraged moist heat and gentle stretching as tolerated. May try NSAIDs and prescription meds as directed and report if symptoms worsen or seek immediate care. Tizanidine, xray

## 2018-05-04 NOTE — Assessment & Plan Note (Addendum)
hgba1c acceptable, minimize simple carbs. Increase exercise as tolerated.  

## 2018-05-04 NOTE — Progress Notes (Signed)
Subjective:  I acted as a Education administrator for Dr. Charlett Blake. [place], [place]  Patient ID: Darren Mueller, male    DOB: 1951-01-11, 67 y.o.   MRN: [id number]  No chief complaint on file.   HPI  Patient is in today for 3 month follow up. He is following up on his HTN, hypothyroidism and other medical concerns. Patient c/o lower back pain. No recent febrile illness or acute hospitalizations. Denies CP/palp/SOB/HA/congestion/fevers/GI or GU c/o. Taking meds as prescribed.  Suffered a recent seizure and has an appointment later this week with neurology to follow-up.  He has had no recurrent seizures is being maintained on Keppra.  He has been noting about a week's worth of low back pain but denies any recent falls or injury.  No radicular symptoms or new levels of incontinence.  Is also noting some fullness in his right ear but no severe pain or nasal congestion. Denies CP/palp/SOB/HA/congestion/fevers/GI or GU c/o. Taking meds as prescribed   Patient Care Team: Mosie Lukes, MD as PCP - General (Family Medicine) Elsie Stain, MD as Consulting Physician (Pulmonary Disease) Bensimhon, Shaune Pascal, MD as Consulting Physician (Cardiology) Wyatt Portela, MD as Consulting Physician (Oncology) Milus Banister, MD as Consulting Physician (Gastroenterology) Gerlene Burdock and [facility] (Dentistry) Cameron Sprang, MD as Consulting Physician (Neurology)   Past Medical History:  Diagnosis Date  . Alcohol dependence ([place]) 03/22/2011   In remission   . Alcohol dependence in remission ([place]) 03/22/2011   In remission   . Alcoholic cirrhosis of liver with ascites ([place]) 2014  . Dermatitis 06/10/2015  . Diarrhea 09/04/2013  . Elbow pain, left 03/17/2017  . GERD (gastroesophageal reflux disease)   . Gout   . Heart murmur   . Hx of colonic polyps   . Hypertension   . Hypertriglyceridemia 03/17/2017  . Hypopotassemia   . Otalgia 11/28/2013  . Other chronic pulmonary heart diseases   . Preventative health care  06/29/2016  . Red eye 03/03/2016   right  . Renal insufficiency 07/18/2013  . Seizures ([place])   . Sleep apnea    does not wear CPAP  . Thrombocytopenia ([place]) 06/29/2016  . Unspecified hypothyroidism 01/25/2013  . Unspecified pleural effusion     Past Surgical History:  Procedure Laterality Date  . CARDIAC CATHETERIZATION N/A 12/21/2015   Procedure: Right Heart Cath;  Surgeon: Jolaine Artist, MD;  Location: [place] [facility];  Service: Cardiovascular;  Laterality: N/A;  . CARDIAC CATHETERIZATION N/A 04/15/2016   Procedure: Right/Left Heart Cath and Coronary Angiography;  Surgeon: Jolaine Artist, MD;  Location: [place] [facility];  Service: Cardiovascular;  Laterality: N/A;  . COLONOSCOPY N/A 12/08/2013   Procedure: COLONOSCOPY;  Surgeon: Milus Banister, MD;  Location: [facility];  Service: Endoscopy;  Laterality: N/A;  . ESOPHAGOGASTRODUODENOSCOPY (EGD) WITH PROPOFOL N/A 03/27/2016   Procedure: ESOPHAGOGASTRODUODENOSCOPY (EGD) WITH PROPOFOL;  Surgeon: Milus Banister, MD;  Location: [place];  Service: Endoscopy;  Laterality: N/A;  . LOOP RECORDER INSERTION N/A 01/01/2017   Procedure: Loop Recorder Insertion;  Surgeon: Thompson Grayer, MD;  Location: [place] [facility];  Service: Cardiovascular;  Laterality: N/A;  . RIGHT HEART CATHETERIZATION Right 06/06/2014   Procedure: RIGHT HEART CATH;  Surgeon: Jolaine Artist, MD;  Location: [facility] CATH LAB;  Service: Cardiovascular;  Laterality: Right;  . UVULOPALATOPHARYNGOPLASTY  1999    Family History  Problem Relation Age of Onset  . Heart disease Mother   . Heart attack Mother   .  Hypertension Mother   . Kidney failure Mother   . Liver disease Mother        stage 4 liver disease  . Prostate cancer Father   . Cancer Father        lung  . Hypertension Brother   . Gout Brother   . Alcoholism Maternal Grandfather   . Liver disease Maternal Grandfather   . Migraines Sister   . Hypertension Brother   . Colon cancer Neg Hx     Social  History   Socioeconomic History  . Marital status: Divorced    Spouse name: Not on file  . Number of children: 3  . Years of education: Not on file  . Highest education level: Not on file  Occupational History  . Occupation: Retired    Comment: Microbiologist  . Financial resource strain: Not on file  . Food insecurity:    Worry: Not on file    Inability: Not on file  . Transportation needs:    Medical: Not on file    Non-medical: Not on file  Tobacco Use  . Smoking status: Former Smoker    Packs/day: 2.00    Years: 5.00    Pack years: 10.00    Types: Cigarettes    Last attempt to quit: 09/22/1979    Years since quitting: 38.6  . Smokeless tobacco: Never Used  Substance and Sexual Activity  . Alcohol use: Yes    Alcohol/week: 3.6 oz    Types: 6 Shots of liquor per week    Comment: social drinker  . Drug use: No  . Sexual activity: Yes    Birth control/protection: Spermicide  Lifestyle  . Physical activity:    Days per week: Not on file    Minutes per session: Not on file  . Stress: Not on file  Relationships  . Social connections:    Talks on phone: Not on file    Gets together: Not on file    Attends religious service: Not on file    Active member of club or organization: Not on file    Attends meetings of clubs or organizations: Not on file    Relationship status: Not on file  . Intimate partner violence:    Fear of current or ex partner: Not on file    Emotionally abused: Not on file    Physically abused: Not on file    Forced sexual activity: Not on file  Other Topics Concern  . Not on file  Social History Narrative   0 caffeine drinks daily     Outpatient Medications Prior to Visit  Medication Sig Dispense Refill  . Albuterol Sulfate (PROAIR RESPICLICK) 229 (90 Base) MCG/ACT AEPB Inhale 2 puffs into the lungs every 6 (six) hours as needed (SOB or wheezing).    . colchicine 0.6 MG tablet TAKE 1 TABLET (0.6 MG TOTAL) BY MOUTH 2 (TWO) TIMES  DAILY. 20 tablet 1  . fluticasone (FLONASE) 50 MCG/ACT nasal spray Place 2 sprays into both nostrils daily. 16 g 1  . fluticasone-salmeterol (ADVAIR HFA) 230-21 MCG/ACT inhaler Inhale 2 puffs into the lungs every morning.    . furosemide (LASIX) 40 MG tablet Take 1 tablet (40 mg total) by mouth 3 (three) times a week. Every Mon, Wed and Friday 180 tablet 2  . ipratropium (ATROVENT HFA) 17 MCG/ACT inhaler Inhale 2 puffs into the lungs every 6 (six) hours as needed for wheezing. 1 Inhaler 12  . losartan (COZAAR) 50  MG tablet TAKE 1 TABLET DAILY 90 tablet 2  . Macitentan (OPSUMIT) 10 MG TABS Take 1 tablet (10 mg total) by mouth daily. 30 tablet 11  . omeprazole (PRILOSEC) 40 MG capsule TAKE 1 CAPSULE DAILY 90 capsule 2  . potassium chloride (K-DUR) 10 MEQ tablet Take 20 mEq by mouth daily.     . rosuvastatin (CRESTOR) 5 MG tablet Take 1 tablet (5 mg total) by mouth daily. 90 tablet 1  . Selexipag (UPTRAVI) 1000 MCG TABS Take 1,000 mcg by mouth 2 (two) times daily. 60 tablet 11  . spironolactone (ALDACTONE) 50 MG tablet TAKE 1 TABLET DAILY 90 tablet 1  . SYMAX-SR 0.375 MG 12 hr tablet     . tadalafil, PAH, (ADCIRCA) 20 MG tablet Take 2 tablets (40 mg total) by mouth daily. 60 tablet 11  . allopurinol (ZYLOPRIM) 100 MG tablet Take 2 tablets (200 mg total) by mouth daily. 90 tablet 1  . levETIRAcetam (KEPPRA) 500 MG tablet Take 1 tablet (500 mg total) by mouth 2 (two) times daily. (Patient taking differently: Take 500 mg by mouth once. ) 60 tablet 3   No facility-administered medications prior to visit.     Allergies  Allergen Reactions  . Aspirin Other (See Comments)    hypertension  . Nitroglycerin     Headache    Review of Systems  Constitutional: Negative for fever and malaise/fatigue.  HENT: Negative for congestion.   Eyes: Negative for blurred vision.  Respiratory: Negative for cough and shortness of breath.   Cardiovascular: Negative for chest pain, palpitations and leg swelling.    Gastrointestinal: Negative for vomiting.  Musculoskeletal: Positive for back pain.  Skin: Negative for rash.  Neurological: Positive for seizures. Negative for loss of consciousness and headaches.  Psychiatric/Behavioral: The patient is nervous/anxious and has insomnia.        Objective:    Physical Exam  Constitutional: He is oriented to person, place, and time. He appears well-developed and well-nourished. No distress.  HENT:  Head: Normocephalic and atraumatic.  Eyes: Conjunctivae are normal.  Neck: Normal range of motion. No thyromegaly present.  Cardiovascular: Normal rate and regular rhythm.  Murmur heard. Pulmonary/Chest: Effort normal and breath sounds normal. He has no wheezes.  Abdominal: Soft. Bowel sounds are normal. There is no tenderness.  Musculoskeletal: Normal range of motion. He exhibits no edema or deformity.  Neurological: He is alert and oriented to person, place, and time.  Skin: Skin is warm and dry. He is not diaphoretic.  Psychiatric: He has a normal mood and affect.    BP (!) 122/58 (BP Location: Left Arm, Patient Position: Sitting, Cuff Size: Normal)   Pulse 87   Temp 97.7 F (36.5 C) (Oral)   Resp 18   Ht 6' (1.829 m)   Wt 197 lb 3.2 oz (89.4 kg)   SpO2 96%   BMI 26.75 kg/m  Wt Readings from Last 3 Encounters:  05/07/18 200 lb (90.7 kg)  05/04/18 197 lb 3.2 oz (89.4 kg)  04/21/18 207 lb 12.8 oz (94.3 kg)   BP Readings from Last 3 Encounters:  05/07/18 98/62  05/04/18 (!) 122/58  04/21/18 120/76     Immunization History  Administered Date(s) Administered  . Hepatitis B 12/03/2012, 01/11/2013  . Hepatitis B, ped/adol 06/03/2013  . Influenza Split 08/28/2011, 11/03/2012  . Influenza, High Dose Seasonal PF 09/11/2016, 11/20/2017  . Influenza,inj,Quad PF,6+ Mos 07/18/2013, 07/25/2014, 09/03/2015  . Pneumococcal Conjugate-13 09/03/2015  . Pneumococcal Polysaccharide-23 08/30/2013  . Tdap 09/02/2013  Health Maintenance  Topic Date  Due  . INFLUENZA VACCINE  06/03/2018  . PNA vac Low Risk Adult (2 of 2 - PPSV23) 08/30/2018  . TETANUS/TDAP  09/03/2023  . COLONOSCOPY  12/09/2023  . Hepatitis C Screening  Completed    Lab Results  Component Value Date   WBC 1.8 Repeated and verified X2. (LL) 05/04/2018   HGB 11.9 (L) 05/04/2018   HCT 35.8 (L) 05/04/2018   PLT 32.0 Repeated and verified X2. (LL) 05/04/2018   GLUCOSE 108 (H) 05/04/2018   CHOL 122 05/04/2018   TRIG 333.0 (H) 05/04/2018   HDL 51.30 05/04/2018   LDLDIRECT 34.0 05/04/2018   LDLCALC 41 02/01/2018   ALT 22 05/04/2018   AST 67 (H) 05/04/2018   NA 135 05/04/2018   K 4.5 05/04/2018   CL 106 05/04/2018   CREATININE 2.18 (H) 05/04/2018   BUN 34 (H) 05/04/2018   CO2 22 05/04/2018   TSH 3.15 05/04/2018   PSA 0.49 11/01/2012   INR 1.25 06/04/2016   HGBA1C 5.1 05/04/2018    Lab Results  Component Value Date   TSH 3.15 05/04/2018   Lab Results  Component Value Date   WBC 1.8 Repeated and verified X2. (LL) 05/04/2018   HGB 11.9 (L) 05/04/2018   HCT 35.8 (L) 05/04/2018   MCV 109.9 (H) 05/04/2018   PLT 32.0 Repeated and verified X2. (LL) 05/04/2018   Lab Results  Component Value Date   NA 135 05/04/2018   K 4.5 05/04/2018   CO2 22 05/04/2018   GLUCOSE 108 (H) 05/04/2018   BUN 34 (H) 05/04/2018   CREATININE 2.18 (H) 05/04/2018   BILITOT 1.4 (H) 05/04/2018   ALKPHOS 225 (H) 05/04/2018   AST 67 (H) 05/04/2018   ALT 22 05/04/2018   PROT 7.2 05/04/2018   ALBUMIN 4.0 05/04/2018   CALCIUM 8.8 05/04/2018   ANIONGAP 8 12/21/2017   GFR 38.85 (L) 05/04/2018   Lab Results  Component Value Date   CHOL 122 05/04/2018   Lab Results  Component Value Date   HDL 51.30 05/04/2018   Lab Results  Component Value Date   LDLCALC 41 02/01/2018   Lab Results  Component Value Date   TRIG 333.0 (H) 05/04/2018   Lab Results  Component Value Date   CHOLHDL 2 05/04/2018   Lab Results  Component Value Date   HGBA1C 5.1 05/04/2018          Assessment & Plan:   Problem List Items Addressed This Visit    Hypertension (Chronic)    Well controlled, no changes to meds. Encouraged heart healthy diet such as the DASH diet and exercise as tolerated.       Relevant Medications   cholestyramine (QUESTRAN) 4 g packet   Other Relevant Orders   CBC (Completed)   Comprehensive metabolic panel (Completed)   TSH (Completed)   Hypothyroidism   Diarrhea    Add Cholestyramine      Gout    Check uric acid today      Relevant Orders   Uric acid (Completed)   Low back pain - Primary    Encouraged moist heat and gentle stretching as tolerated. May try NSAIDs and prescription meds as directed and report if symptoms worsen or seek immediate care. Tizanidine, xray      Relevant Medications   tiZANidine (ZANAFLEX) 2 MG tablet   Other Relevant Orders   DG Lumbar Spine Complete (Completed)   Thrombocytopenia (HCC)    Repeat CBC shows mild improvement  and asymptomatic, continue to monitor      Hyperlipidemia    Encouraged heart healthy diet, increase exercise, avoid trans fats, consider a krill oil cap daily      Relevant Medications   cholestyramine (QUESTRAN) 4 g packet   Other Relevant Orders   Lipid panel (Completed)   Seizure disorder ([place])    Had a recent new onset seizure which has not recurred and he has an appt with neurology later this week      Hyperglycemia    hgba1c acceptable, minimize simple carbs. Increase exercise as tolerated.       Relevant Orders   Hemoglobin A1c (Completed)    Other Visit Diagnoses    SOM (secretory otitis media), bilateral       Relevant Orders   Ambulatory referral to ENT      I am having Arnette Norris start on tiZANidine and cholestyramine. I am also having him maintain his Albuterol Sulfate, fluticasone-salmeterol, spironolactone, losartan, omeprazole, Selexipag, potassium chloride, ipratropium, macitentan, rosuvastatin, tadalafil (PAH), colchicine, SYMAX-SR,  fluticasone, furosemide, and allopurinol.  Meds ordered this encounter  Medications  . allopurinol (ZYLOPRIM) 100 MG tablet    Sig: Take 2 tablets (200 mg total) by mouth daily.    Dispense:  180 tablet    Refill:  1  . tiZANidine (ZANAFLEX) 2 MG tablet    Sig: Take 0.5-2 tablets (1-4 mg total) by mouth every 6 (six) hours as needed for muscle spasms.    Dispense:  30 tablet    Refill:  0  . cholestyramine (QUESTRAN) 4 g packet    Sig: Take 1 packet (4 g total) by mouth 2 (two) times daily.    Dispense:  60 each    Refill:  12   CMA served as scribe during this visit. History, Physical and Plan performed by medical provider. Documentation and orders reviewed and attested to.  Penni Homans, MD

## 2018-05-04 NOTE — Assessment & Plan Note (Addendum)
Repeat CBC shows mild improvement and asymptomatic, continue to monitor

## 2018-05-04 NOTE — Telephone Encounter (Signed)
stable

## 2018-05-04 NOTE — Telephone Encounter (Signed)
CRITICAL VALUE STICKER  CRITICAL VALUE:WBC:1.8,Plt:36  RECEIVER (on-site recipient of call):Angie  DATE & TIME NOTIFIED:05/04/18 @ 12:43  MESSENGER (representative from lab):Karen-Elam  MD NOTIFIED:Dr. Charlett Blake   TIME OF NOTIFICATION:12:47pm  RESPONSE:Was asked to send message regarding critical results.//AB/CMA

## 2018-05-04 NOTE — Assessment & Plan Note (Signed)
Encouraged heart healthy diet, increase exercise, avoid trans fats, consider a krill oil cap daily 

## 2018-05-04 NOTE — Patient Instructions (Signed)
Lidocaine patches or gel by [employer], [employer] or [employer] or [employer]  Back Pain, Adult Many adults have back pain from time to time. Common causes of back pain include:  A strained muscle or ligament.  Wear and tear (degeneration) of the spinal disks.  Arthritis.  A hit to the back.  Back pain can be short-lived (acute) or last a long time (chronic). A physical exam, lab tests, and imaging studies may be done to find the cause of your pain. Follow these instructions at home: Managing pain and stiffness  Take over-the-counter and prescription medicines only as told by your health care provider.  If directed, apply heat to the affected area as often as told by your health care provider. Use the heat source that your health care provider recommends, such as a moist heat pack or a heating pad. ? Place a towel between your skin and the heat source. ? Leave the heat on for 20-30 minutes. ? Remove the heat if your skin turns bright red. This is especially important if you are unable to feel pain, heat, or cold. You have a greater risk of getting burned.  If directed, apply ice to the injured area: ? Put ice in a plastic bag. ? Place a towel between your skin and the bag. ? Leave the ice on for 20 minutes, 2-3 times a day for the first 2-3 days. Activity  Do not stay in bed. Resting more than 1-2 days can delay your recovery.  Take short walks on even surfaces as soon as you are able. Try to increase the length of time you walk each day.  Do not sit, drive, or stand in one place for more than 30 minutes at a time. Sitting or standing for long periods of time can put stress on your back.  Use proper lifting techniques. When you bend and lift, use positions that put less stress on your back: ? Garvin your knees. ? Keep the load close to your body. ? Avoid twisting.  Exercise regularly as told by your health care provider. Exercising will help your back heal faster. This also helps  prevent back injuries by keeping muscles strong and flexible.  Your health care provider may recommend that you see a physical therapist. This person can help you come up with a safe exercise program. Do any exercises as told by your physical therapist. Lifestyle  Maintain a healthy weight. Extra weight puts stress on your back and makes it difficult to have good posture.  Avoid activities or situations that make you feel anxious or stressed. Learn ways to manage anxiety and stress. One way to manage stress is through exercise. Stress and anxiety increase muscle tension and can make back pain worse. General instructions  Sleep on a firm mattress in a comfortable position. Try lying on your side with your knees slightly bent. If you lie on your back, put a pillow under your knees.  Follow your treatment plan as told by your health care provider. This may include: ? Cognitive or behavioral therapy. ? Acupuncture or massage therapy. ? Meditation or yoga. Contact a health care provider if:  You have pain that is not relieved with rest or medicine.  You have increasing pain going down into your legs or buttocks.  Your pain does not improve in 2 weeks.  You have pain at night.  You lose weight.  You have a fever or chills. Get help right away if:  You develop new  bowel or bladder control problems.  You have unusual weakness or numbness in your arms or legs.  You develop nausea or vomiting.  You develop abdominal pain.  You feel faint. Summary  Many adults have back pain from time to time. A physical exam, lab tests, and imaging studies may be done to find the cause of your pain.  Use proper lifting techniques. When you bend and lift, use positions that put less stress on your back.  Take over-the-counter and prescription medicines and apply heat or ice as directed by your health care provider. This information is not intended to replace advice given to you by your health care  provider. Make sure you discuss any questions you have with your health care provider. Document Released: 10/20/2005 Document Revised: 11/24/2016 Document Reviewed: 11/24/2016 Elsevier Interactive Patient Education  [employer].

## 2018-05-04 NOTE — Telephone Encounter (Signed)
Spoke with patient about lab results.  Placed orders for CMP

## 2018-05-04 NOTE — Assessment & Plan Note (Signed)
Check uric acid today 

## 2018-05-04 NOTE — Assessment & Plan Note (Signed)
Well controlled, no changes to meds. Encouraged heart healthy diet such as the DASH diet and exercise as tolerated.

## 2018-05-04 NOTE — Telephone Encounter (Signed)
Copied from [place] [id number]. Topic: Quick Communication - See Telephone Encounter >> May 04, 2018  4:19 PM Burchel, Abbi R wrote:  See Telephone encounter for: 05/04/18.   Pt would like a call back to clarify instructions regarding: allopurinol (ZYLOPRIM) 100 MG tablet.  He states he was told to stop taking it, and would like to make sure that was correct.   Pt: [phone]

## 2018-05-07 ENCOUNTER — Ambulatory Visit (INDEPENDENT_AMBULATORY_CARE_PROVIDER_SITE_OTHER): Payer: Medicare Other | Admitting: Neurology

## 2018-05-07 ENCOUNTER — Other Ambulatory Visit: Payer: Self-pay

## 2018-05-07 ENCOUNTER — Encounter: Payer: Self-pay | Admitting: Neurology

## 2018-05-07 DIAGNOSIS — R569 Unspecified convulsions: Secondary | ICD-10-CM

## 2018-05-07 MED ORDER — LEVETIRACETAM 500 MG PO TABS: ORAL_TABLET | ORAL | 3 refills | Status: DC

## 2018-05-07 MED FILL — levETIRAcetam 500 MG TABS: 500 | 90 days supply | Qty: 90 | Fill #0

## 2018-05-07 NOTE — Progress Notes (Signed)
NEUROLOGY FOLLOW UP OFFICE NOTE  Darren Mueller [id number] [birth year]  HISTORY OF PRESENT ILLNESS: I had the pleasure of seeing Darren Mueller in follow-up in the neurology clinic on 68/03/2018.  The patient was last seen 4 months ago after an episode of shaking last 11/18/17. Head CT no acute changes. Records and images were personally reviewed where available.  His 1-hour EEG and 24-hour EEG were normal, typical events were not captured. He is taking the Keppra 550m only once a day without side effects except possibly daytime fatigue, no further shaking spells since January 2019. He denies being told of any staring/unresponsive episodes, no gaps in time, olfactory/gustatory hallucinations, focal numbness/tingling/weakness, no myoclonic jerks. He denies any headaches, dizziness, vision changes, no falls. He feels short of breath walking short distances.   HPI 01/25/18: This is a pleasant 67yo LH man with a history of hypertension, hyperlipidemia, syncope,  Who had a witnessed shaking episode last 11/18/17. He recalls watching TV then has no recollection until the ambulance was there. His son reported he was sitting on the couch then started shaking. He recalls feeling dizzy when he came to. He denied any muscle soreness, tongue bite, or incontinence. [facility] showed was remarkable for a WBC of 2.6, Hct 33.9, platelet count of 43, creatinine of 2.09, AST 65, alkaline phos 171, total bilirubin 2.2, and elevated EtOH level of 164 and elevated lactic acid of 2.14. CK normal. He recalls having 3 beers that night. I personally reviewed head CT without contrast which did not show any acute changes, there was mild atrophy and chronic microvascular disease. He has had several syncopal episodes in the past, he was admitted in August 2017 and September 2017, with this admission he had an elevated alcohol level and rib fracture. He wore a 30-day monitor with no events captured, no  arrhythmia. He had another syncopal episode in February 2018 and had a loop recorder placed. No arrhythmia seen on loop recorder. Due to report of prior seizures and recurrent syncope, he was discharged home on Keppra 68mBID, but has only been taking it once a day.   He has been living with his son the past 3 years, he denies being told of any staring/unresponsive episodes, he denies any other gaps in time, no olfactory/gustatory hallucinations, deja vu, rising epigastric sensation, focal numbness/tingling/weakness, myoclonic jerks. When he was in the ER last January, there is report of him having seizure in the past but never taking seizure medication. Today he denies any prior history of seizures. He [place] [facility] [place] [facility] [employer] [facility] would not have allowed him to do that if he had seizures. He has dizziness every now and then where he feels lightheaded for a couple of seconds when he gets up too fast. He denies any headaches, diplopia, dysarthria/dysphagia, neck/back pain, bladder dysfunction. He has occasional diarrhea. He had a normal birth and early development.  There is no history of febrile convulsions, CNS infections such as meningitis/encephalitis, significant traumatic brain injury, neurosurgical procedures, or family history of seizures. He reports drinking 1-2 beers at night now.  PAST MEDICAL HISTORY: Past Medical History:  Diagnosis Date  . [place] dependence ([facility]   In remission   . [facility] dependence in [facility] ([facility]2012   In remission   . Alcoholic cirrhosis of liver with ascites ([facility]  . Dermatitis 06/10/2015  . Diarrhea 09/04/2013  . Elbow pain, left 03/17/2017  . GERD (gastroesophageal  reflux disease)   . Gout   . Heart murmur   . Hx of colonic polyps   . Hypertension   . Hypertriglyceridemia 03/17/2017  . Hypopotassemia   . Otalgia 11/28/2013  . Other chronic pulmonary heart diseases   . Preventative health care 06/29/2016  . Red eye 03/03/2016     right  . Renal insufficiency 07/18/2013  . Seizures ([place])   . Sleep apnea    does not wear CPAP  . Thrombocytopenia ([place]) 06/29/2016  . Unspecified hypothyroidism 01/25/2013  . Unspecified pleural effusion     MEDICATIONS: Current Outpatient Medications on File Prior to Visit  Medication Sig Dispense Refill  . Albuterol Sulfate (PROAIR RESPICLICK) 808 (90 Base) MCG/ACT AEPB Inhale 2 puffs into the lungs every 6 (six) hours as needed (SOB or wheezing).    Marland Kitchen allopurinol (ZYLOPRIM) 100 MG tablet Take 2 tablets (200 mg total) by mouth daily. 180 tablet 1  . cholestyramine (QUESTRAN) 4 g packet Take 1 packet (4 g total) by mouth 2 (two) times daily. 60 each 12  . colchicine 0.6 MG tablet TAKE 1 TABLET (0.6 MG TOTAL) BY MOUTH 2 (TWO) TIMES DAILY. 20 tablet 1  . fluticasone (FLONASE) 50 MCG/ACT nasal spray Place 2 sprays into both nostrils daily. 16 g 1  . fluticasone-salmeterol (ADVAIR HFA) 230-21 MCG/ACT inhaler Inhale 2 puffs into the lungs every morning.    . furosemide (LASIX) 40 MG tablet Take 1 tablet (40 mg total) by mouth 3 (three) times a week. Every Mon, Wed and Friday 180 tablet 2  . ipratropium (ATROVENT HFA) 17 MCG/ACT inhaler Inhale 2 puffs into the lungs every 6 (six) hours as needed for wheezing. 1 Inhaler 12  . levETIRAcetam (KEPPRA) 500 MG tablet Take 1 tablet (500 mg total) by mouth 2 (two) times daily. (Patient taking differently: Take 500 mg by mouth once. ) 60 tablet 3  . losartan (COZAAR) 50 MG tablet TAKE 1 TABLET DAILY 90 tablet 2  . Macitentan (OPSUMIT) 10 MG TABS Take 1 tablet (10 mg total) by mouth daily. 30 tablet 11  . omeprazole (PRILOSEC) 40 MG capsule TAKE 1 CAPSULE DAILY 90 capsule 2  . potassium chloride (K-DUR) 10 MEQ tablet Take 20 mEq by mouth daily.     . rosuvastatin (CRESTOR) 5 MG tablet Take 1 tablet (5 mg total) by mouth daily. 90 tablet 1  . Selexipag (UPTRAVI) 1000 MCG TABS Take 1,000 mcg by mouth 2 (two) times daily. 60 tablet 11  . spironolactone  (ALDACTONE) 50 MG tablet TAKE 1 TABLET DAILY 90 tablet 1  . SYMAX-SR 0.375 MG 12 hr tablet     . tadalafil, PAH, (ADCIRCA) 20 MG tablet Take 2 tablets (40 mg total) by mouth daily. 60 tablet 11  . tiZANidine (ZANAFLEX) 2 MG tablet Take 0.5-2 tablets (1-4 mg total) by mouth every 6 (six) hours as needed for muscle spasms. 30 tablet 0   No current facility-administered medications on file prior to visit.     ALLERGIES: Allergies  Allergen Reactions  . Aspirin Other (See Comments)    hypertension  . Nitroglycerin     Headache    FAMILY HISTORY: Family History  Problem Relation Age of Onset  . Heart disease Mother   . Heart attack Mother   . Hypertension Mother   . Kidney failure Mother   . Liver disease Mother        stage 4 liver disease  . Prostate cancer Father   . Cancer Father  lung  . Hypertension Brother   . Gout Brother   . Alcoholism Maternal Grandfather   . Liver disease Maternal Grandfather   . Migraines Sister   . Hypertension Brother   . Colon cancer Neg Hx     SOCIAL HISTORY: Social History   Socioeconomic History  . Marital status: Divorced    Spouse name: Not on file  . Number of children: 3  . Years of education: Not on file  . Highest education level: Not on file  Occupational History  . Occupation: Retired    Comment: Microbiologist  . Financial resource strain: Not on file  . Food insecurity:    Worry: Not on file    Inability: Not on file  . Transportation needs:    Medical: Not on file    Non-medical: Not on file  Tobacco Use  . Smoking status: Former Smoker    Packs/day: 2.00    Years: 5.00    Pack years: 10.00    Types: Cigarettes    Last attempt to quit: 09/22/1979    Years since quitting: 38.6  . Smokeless tobacco: Never Used  Substance and Sexual Activity  . Alcohol use: Yes    Alcohol/week: 3.6 oz    Types: 6 Shots of liquor per week    Comment: social drinker  . Drug use: No  . Sexual activity: Yes     Birth control/protection: Spermicide  Lifestyle  . Physical activity:    Days per week: Not on file    Minutes per session: Not on file  . Stress: Not on file  Relationships  . Social connections:    Talks on phone: Not on file    Gets together: Not on file    Attends religious service: Not on file    Active member of club or organization: Not on file    Attends meetings of clubs or organizations: Not on file    Relationship status: Not on file  . Intimate partner violence:    Fear of current or ex partner: Not on file    Emotionally abused: Not on file    Physically abused: Not on file    Forced sexual activity: Not on file  Other Topics Concern  . Not on file  Social History Narrative   0 caffeine drinks daily     REVIEW OF SYSTEMS: Constitutional: No fevers, chills, or sweats, no generalized fatigue, change in appetite Eyes: No visual changes, double vision, eye pain Ear, nose and throat: No hearing loss, ear pain, nasal congestion, sore throat Cardiovascular: No chest pain, palpitations Respiratory:  No shortness of breath at rest or with exertion, wheezes GastrointestinaI: No nausea, vomiting, diarrhea, abdominal pain, fecal incontinence Genitourinary:  No dysuria, urinary retention or frequency Musculoskeletal:  No neck pain, back pain Integumentary: No rash, pruritus, skin lesions Neurological: as above Psychiatric: No depression, insomnia, anxiety Endocrine: No palpitations, fatigue, diaphoresis, mood swings, change in appetite, change in weight, increased thirst Hematologic/Lymphatic:  No anemia, purpura, petechiae. Allergic/Immunologic: no itchy/runny eyes, nasal congestion, recent allergic reactions, rashes  PHYSICAL EXAM: Vitals:   05/07/18 1134  BP: 98/62  Pulse: 81  SpO2: 99%   General: No acute distress Head:  Normocephalic/atraumatic Neck: supple, no paraspinal tenderness, full range of motion Heart:  Regular rate and rhythm Lungs:  Clear to  auscultation bilaterally Back: No paraspinal tenderness Skin/Extremities: No rash, no edema Neurological Exam: alert and oriented to person, place, and time. No aphasia or dysarthria. Fund  of knowledge is appropriate.  Recent and remote memory are intact.  Attention and concentration are normal.    Able to name objects and repeat phrases. Cranial nerves: Pupils equal, round, reactive to light. Extraocular movements intact with no nystagmus. Visual fields full. Facial sensation intact. No facial asymmetry. Tongue, uvula, palate midline.  Motor: Bulk and tone normal, muscle strength 5/5 throughout with no pronator drift.  Sensation to light touch intact.  No extinction to double simultaneous stimulation.  Deep tendon reflexes 2+ throughout, toes downgoing.  Finger to nose testing intact.  Gait wide-based,slow and cautious due to shortness of breath, no ataxia.  IMPRESSION: This is a pleasant 67 yo LH man with a history of hypertension, hyperlipidemia, syncope, who had a witnessed shaking episode last 11/18/17. His neurological exam is normal, head CT normal and 24-hour EEG normal. Cardiac workup unremarkable. Etiology of seizure unclear. He denies any further episodes since January 2019, on low dose Keppra 532m daily. We had an extensive discussion about risks and benefits of staying on medication or weaning off, he would like to continue on low dose Keppra [id number] for now, we discussed that if seizure recurs, would increase dose. He is aware of [place] driving laws to stop driving after an episode of loss of consciousness until 6 months event-free. He will follow-up in 6 months and knows to call for any changes  Thank you for allowing me to participate in his care.  Please do not hesitate to call for any questions or concerns.  The duration of this appointment visit was 25 minutes of face-to-face time with the patient.  Greater than 50% of this time was spent in counseling, explanation of diagnosis, planning  of further management, and coordination of care.   KaEllouise NewerM.D.   CC: Dr. BlCharlett BlakeDr. BeHaroldine Laws

## 2018-05-07 NOTE — Patient Instructions (Signed)
1. Continue Levetiracetam (Keppra) 543m: take 1 tablet every night  2. If any changes in symptoms such as recurrence of seizure, we will need to increase dose  3. Follow-up in 6 months, call for any changes  Seizure Precautions: 1. If medication has been prescribed for you to prevent seizures, take it exactly as directed.  Do not stop taking the medicine without talking to your doctor first, even if you have not had a seizure in a long time.   2. Avoid activities in which a seizure would cause danger to yourself or to others.  Don't operate dangerous machinery, swim alone, or climb in high or dangerous places, such as on ladders, roofs, or girders.  Do not drive unless your doctor says you may.  3. If you have any warning that you may have a seizure, lay down in a safe place where you can't hurt yourself.    4.  No driving for 6 months from last seizure, as per [facility]   Please refer to the following link on the ECanadianwebsite for more information: http://[email or web address].org/answerplace/Social/driving/drivingu.cfm   5.  Maintain good sleep hygiene. Avoid alcohol.  6.  Contact your doctor if you have any problems that may be related to the medicine you are taking.  7.  Call 911 and bring the patient back to the ED if:        A.  The seizure lasts longer than 5 minutes.       B.  The patient doesn't awaken shortly after the seizure  C.  The patient has new problems such as difficulty seeing, speaking or moving  D.  The patient was injured during the seizure  E.  The patient has a temperature over 102 F (39C)  F.  The patient vomited and now is having trouble breathing

## 2018-05-09 NOTE — Assessment & Plan Note (Signed)
Had a recent new onset seizure which has not recurred and he has an appt with neurology later this week

## 2018-05-11 ENCOUNTER — Ambulatory Visit (INDEPENDENT_AMBULATORY_CARE_PROVIDER_SITE_OTHER): Payer: Medicare Other | Admitting: *Deleted

## 2018-05-11 ENCOUNTER — Other Ambulatory Visit (INDEPENDENT_AMBULATORY_CARE_PROVIDER_SITE_OTHER): Payer: Medicare Other

## 2018-05-11 DIAGNOSIS — I10 Essential (primary) hypertension: Secondary | ICD-10-CM

## 2018-05-11 DIAGNOSIS — R55 Syncope and collapse: Secondary | ICD-10-CM

## 2018-05-11 LAB — COMPREHENSIVE METABOLIC PANEL
ALBUMIN: 3.8 g/dL (ref 3.5–5.2)
ALT: 21 U/L (ref 0–53)
AST: 59 U/L — AB (ref 0–37)
Alkaline Phosphatase: 209 U/L — ABNORMAL HIGH (ref 39–117)
BILIRUBIN TOTAL: 1.6 mg/dL — AB (ref 0.2–1.2)
BUN: 45 mg/dL — AB (ref 6–23)
CALCIUM: 8.7 mg/dL (ref 8.4–10.5)
CO2: 20 meq/L (ref 19–32)
CREATININE: 1.88 mg/dL — AB (ref 0.40–1.50)
Chloride: 106 mEq/L (ref 96–112)
GFR: 46.09 mL/min — ABNORMAL LOW (ref >60.00)
Glucose, Bld: 103 mg/dL — ABNORMAL HIGH (ref 70–99)
Potassium: 4.6 mEq/L (ref 3.5–5.1)
SODIUM: 133 meq/L — AB (ref 135–145)
Total Protein: 7 g/dL (ref 6.0–8.3)

## 2018-05-11 MED FILL — COLCHICINE 0.6 MG TABS: 0.6 | 10 days supply | Qty: 20 | Fill #1

## 2018-05-11 MED FILL — TADALAFIL (PAH) 20 MG TABS: 20 | 30 days supply | Qty: 60 | Fill #1

## 2018-05-11 MED FILL — ROSUVASTATIN CALCIUM 5 MG T: 5 | 90 days supply | Qty: 90 | Fill #1

## 2018-05-12 NOTE — Progress Notes (Signed)
[facility] Summary Report / Loop Recorder

## 2018-05-13 DIAGNOSIS — H6983 Other specified disorders of Eustachian tube, bilateral: Secondary | ICD-10-CM | POA: Diagnosis not present

## 2018-05-13 DIAGNOSIS — H903 Sensorineural hearing loss, bilateral: Secondary | ICD-10-CM | POA: Diagnosis not present

## 2018-05-17 LAB — CUP PACEART REMOTE DEVICE CHECK
Date Time Interrogation Session: 20190607003944
MDC IDC PG IMPLANT DT: 20180301

## 2018-05-18 ENCOUNTER — Other Ambulatory Visit: Payer: Self-pay | Admitting: Otolaryngology

## 2018-05-18 DIAGNOSIS — R42 Dizziness and giddiness: Secondary | ICD-10-CM

## 2018-05-25 ENCOUNTER — Ambulatory Visit
Admission: RE | Admit: 2018-05-25 | Discharge: 2018-05-25 | Disposition: A | Discharge status: Home / Self Care | Payer: Medicare Other | Source: Ambulatory Visit | Attending: Otolaryngology | Admitting: Otolaryngology

## 2018-05-25 DIAGNOSIS — R42 Dizziness and giddiness: Secondary | ICD-10-CM | POA: Diagnosis not present

## 2018-05-25 MED ORDER — GADOBENATE DIMEGLUMINE 529 MG/ML IV SOLN
9.0000 mL | Freq: Once | INTRAVENOUS | Status: AC | PRN
  Administered 2018-05-25: 9 mL via INTRAVENOUS

## 2018-06-14 ENCOUNTER — Ambulatory Visit (INDEPENDENT_AMBULATORY_CARE_PROVIDER_SITE_OTHER): Payer: Medicare Other | Admitting: *Deleted

## 2018-06-14 ENCOUNTER — Other Ambulatory Visit: Payer: Self-pay | Admitting: Family Medicine

## 2018-06-14 DIAGNOSIS — R55 Syncope and collapse: Secondary | ICD-10-CM

## 2018-06-14 MED FILL — TADALAFIL (PAH) 20 MG TABS: 20 | 30 days supply | Qty: 60 | Fill #2

## 2018-06-14 MED FILL — COLCHICINE 0.6 MG TABS: 0.6 | 30 days supply | Qty: 60 | Fill #0

## 2018-06-14 NOTE — Progress Notes (Signed)
[facility] Summary Report / Loop Recorder

## 2018-06-17 LAB — CUP PACEART REMOTE DEVICE CHECK
Date Time Interrogation Session: 20190710013836
MDC IDC PG IMPLANT DT: 20180301

## 2018-07-07 DIAGNOSIS — N183 Chronic kidney disease, stage 3 (moderate): Secondary | ICD-10-CM | POA: Diagnosis not present

## 2018-07-07 DIAGNOSIS — R809 Proteinuria, unspecified: Secondary | ICD-10-CM | POA: Diagnosis not present

## 2018-07-07 DIAGNOSIS — N179 Acute kidney failure, unspecified: Secondary | ICD-10-CM | POA: Diagnosis not present

## 2018-07-07 DIAGNOSIS — D6959 Other secondary thrombocytopenia: Secondary | ICD-10-CM | POA: Diagnosis not present

## 2018-07-07 DIAGNOSIS — I5033 Acute on chronic diastolic (congestive) heart failure: Secondary | ICD-10-CM | POA: Diagnosis not present

## 2018-07-07 DIAGNOSIS — D631 Anemia in chronic kidney disease: Secondary | ICD-10-CM | POA: Diagnosis not present

## 2018-07-07 DIAGNOSIS — G4733 Obstructive sleep apnea (adult) (pediatric): Secondary | ICD-10-CM | POA: Diagnosis not present

## 2018-07-07 DIAGNOSIS — I129 Hypertensive chronic kidney disease with stage 1 through stage 4 chronic kidney disease, or unspecified chronic kidney disease: Secondary | ICD-10-CM | POA: Diagnosis not present

## 2018-07-07 DIAGNOSIS — N189 Chronic kidney disease, unspecified: Secondary | ICD-10-CM | POA: Diagnosis not present

## 2018-07-07 DIAGNOSIS — K7030 Alcoholic cirrhosis of liver without ascites: Secondary | ICD-10-CM | POA: Diagnosis not present

## 2018-07-07 DIAGNOSIS — I2721 Secondary pulmonary arterial hypertension: Secondary | ICD-10-CM | POA: Diagnosis not present

## 2018-07-14 ENCOUNTER — Telehealth: Payer: Self-pay | Admitting: Family Medicine

## 2018-07-14 ENCOUNTER — Other Ambulatory Visit: Payer: Self-pay | Admitting: Family Medicine

## 2018-07-14 MED ORDER — HYOSCYAMINE SULFATE 0.125 MG SL SUBL: 0.1250 mg | SUBLINGUAL_TABLET | Freq: Four times a day (QID) | SUBLINGUAL | 1 refills | Status: DC | PRN

## 2018-07-14 MED FILL — OSCIMIN SL 0.125 MG TABLET: 0.125 | 8 days supply | Qty: 30 | Fill #0

## 2018-07-14 NOTE — Telephone Encounter (Signed)
I sent in Hyoscyamine to use SL for cramps and he has Questran for the diarhea, has he tried that yet?

## 2018-07-14 NOTE — Telephone Encounter (Signed)
Copied from [place] [id number]. Topic: Quick Communication - See Telephone Encounter >> Jul 14, 2018  1:07 PM Rutherford Nail, NT wrote: CRM for notification. See Telephone encounter for: 07/14/18. Patient calling in regards to his results from the kidney specialist. States that he has not heard anything from them regarding the results. Also, would like to know if Dr Charlett Blake could send in something to the pharmacy for his cramps and diarrhea. States that he is still having that in the mornings.  CB#: [phone] [facility] [place]

## 2018-07-15 ENCOUNTER — Other Ambulatory Visit: Payer: Self-pay | Admitting: Nephrology

## 2018-07-15 DIAGNOSIS — R809 Proteinuria, unspecified: Secondary | ICD-10-CM

## 2018-07-15 DIAGNOSIS — N183 Chronic kidney disease, stage 3 unspecified: Secondary | ICD-10-CM

## 2018-07-15 DIAGNOSIS — N189 Chronic kidney disease, unspecified: Secondary | ICD-10-CM

## 2018-07-15 DIAGNOSIS — N179 Acute kidney failure, unspecified: Secondary | ICD-10-CM

## 2018-07-16 ENCOUNTER — Ambulatory Visit
Admission: RE | Admit: 2018-07-16 | Discharge: 2018-07-16 | Disposition: A | Discharge status: Home / Self Care | Payer: Medicare Other | Source: Ambulatory Visit | Attending: Nephrology | Admitting: Nephrology

## 2018-07-16 ENCOUNTER — Other Ambulatory Visit (HOSPITAL_COMMUNITY): Payer: Self-pay | Admitting: Cardiology

## 2018-07-16 ENCOUNTER — Ambulatory Visit (INDEPENDENT_AMBULATORY_CARE_PROVIDER_SITE_OTHER): Payer: Medicare Other | Admitting: *Deleted

## 2018-07-16 DIAGNOSIS — N189 Chronic kidney disease, unspecified: Secondary | ICD-10-CM

## 2018-07-16 DIAGNOSIS — N183 Chronic kidney disease, stage 3 unspecified: Secondary | ICD-10-CM

## 2018-07-16 DIAGNOSIS — R55 Syncope and collapse: Secondary | ICD-10-CM

## 2018-07-16 DIAGNOSIS — R809 Proteinuria, unspecified: Secondary | ICD-10-CM

## 2018-07-16 DIAGNOSIS — N179 Acute kidney failure, unspecified: Secondary | ICD-10-CM

## 2018-07-16 NOTE — Telephone Encounter (Signed)
Called left message for patient to call the office back

## 2018-07-16 NOTE — Telephone Encounter (Signed)
Patient notified that medication was sent in and to use the packets for the [place]

## 2018-07-17 NOTE — Progress Notes (Signed)
[facility] Summary Report / Loop Recorder

## 2018-07-21 ENCOUNTER — Inpatient Hospital Stay: Payer: Medicare Other

## 2018-07-21 ENCOUNTER — Telehealth: Payer: Self-pay | Admitting: Oncology

## 2018-07-21 ENCOUNTER — Inpatient Hospital Stay: Payer: Medicare Other | Attending: Oncology | Admitting: Oncology

## 2018-07-21 ENCOUNTER — Encounter: Payer: Self-pay | Admitting: Family Medicine

## 2018-07-21 VITALS — BP 125/73 | HR 94 | Temp 98.3°F | Resp 18 | Ht 72.0 in | Wt 185.6 lb

## 2018-07-21 DIAGNOSIS — R161 Splenomegaly, not elsewhere classified: Secondary | ICD-10-CM | POA: Diagnosis not present

## 2018-07-21 DIAGNOSIS — D696 Thrombocytopenia, unspecified: Secondary | ICD-10-CM | POA: Insufficient documentation

## 2018-07-21 DIAGNOSIS — K7030 Alcoholic cirrhosis of liver without ascites: Secondary | ICD-10-CM | POA: Diagnosis not present

## 2018-07-21 DIAGNOSIS — D72819 Decreased white blood cell count, unspecified: Secondary | ICD-10-CM | POA: Insufficient documentation

## 2018-07-21 LAB — CBC WITH DIFFERENTIAL (CANCER CENTER ONLY)
BASOS ABS: 0 10*3/uL (ref 0.0–0.1)
BASOS PCT: 0 %
Eosinophils Absolute: 0 10*3/uL (ref 0.0–0.5)
Eosinophils Relative: 1 %
HEMATOCRIT: 32.1 % — AB (ref 38.4–49.9)
HEMOGLOBIN: 10.6 g/dL — AB (ref 13.0–17.1)
Lymphocytes Relative: 25 %
Lymphs Abs: 0.4 10*3/uL — ABNORMAL LOW (ref 0.9–3.3)
MCH: 35.3 pg — ABNORMAL HIGH (ref 27.2–33.4)
MCHC: 33 g/dL (ref 32.0–36.0)
MCV: 107 fL — ABNORMAL HIGH (ref 79.3–98.0)
MONOS PCT: 11 %
Monocytes Absolute: 0.2 10*3/uL (ref 0.1–0.9)
NEUTROS ABS: 0.9 10*3/uL — AB (ref 1.5–6.5)
NEUTROS PCT: 63 %
Platelet Count: 24 10*3/uL — ABNORMAL LOW (ref 140–400)
RBC: 3 MIL/uL — ABNORMAL LOW (ref 4.20–5.82)
RDW: 15 % — ABNORMAL HIGH (ref 11.0–14.6)
WBC Count: 1.5 10*3/uL — ABNORMAL LOW (ref 4.0–10.3)

## 2018-07-21 NOTE — Progress Notes (Signed)
Hematology and Oncology Follow Up Visit  Darren Mueller [id number] [birth year] 67 y.o. 07/21/2018 9:10 AM Darren Mueller, MDBlyth, Bonnita Levan, MD   Principle Diagnosis: 67 year old with:  1. Thrombocytopenia:diagnosed in 2012 related to chronic alcohol consumption and cirrhosis of the liver.  His platelet count of range between 30 and 40,000 since that time.  2.  Cirrhosis of the liver: Without any evidence of recent decompensation.  Current therapy: Active surveillance.  Interim History:  Darren Mueller is here for a follow-up visit.  Since the last visit, he reports no major changes in his health.  He remains active continues to attend to activities of daily living.  He is caring for his mother who is currently under hospice and remains active and multiple volunteer activities.  He denies any hematochezia, melena, epistaxis or hematochezia.  He denies any easy bruising.  Continues to eat reasonably well.  He has cut down on drinking significantly although continues to drink alcohol.  He does not report any headaches, blurry vision, syncope or seizures.  He denies any confusion or alteration in mental status.  He does not report any fevers, chills or sweats. He does not report any cough, wheezing or hemoptysis. He does not report any chest pain, palpitation or orthopnea. Does not report any nausea, vomiting, abdominal pain, or early satiety. He does not report any changes in bowel habits.  He does not report any frequency urgency or hesitancy. He does not report any arthralgias or myalgias.  Remaining review of system is negative.  Medications: I have reviewed the patient's current medications.  Current Outpatient Medications  Medication Sig Dispense Refill  . Albuterol Sulfate (PROAIR RESPICLICK) 166 (90 Base) MCG/ACT AEPB Inhale 2 puffs into the lungs every 6 (six) hours as needed (SOB or wheezing).    Marland Kitchen allopurinol (ZYLOPRIM) 100 MG tablet Take 2 tablets (200 mg total) by mouth daily. 180 tablet  1  . cholestyramine (QUESTRAN) 4 g packet Take 1 packet (4 g total) by mouth 2 (two) times daily. 60 each 12  . colchicine 0.6 MG tablet Take 1 tablet (0.6 mg total) by mouth 2 (two) times daily as needed. 60 tablet 1  . fluticasone (FLONASE) 50 MCG/ACT nasal spray Place 2 sprays into both nostrils daily. 16 g 1  . fluticasone-salmeterol (ADVAIR HFA) 230-21 MCG/ACT inhaler Inhale 2 puffs into the lungs every morning.    . furosemide (LASIX) 40 MG tablet Take 1 tablet (40 mg total) by mouth 3 (three) times a week. Every Mon, Wed and Friday 180 tablet 2  . hyoscyamine (LEVSIN SL) 0.125 MG SL tablet Place 1 tablet (0.125 mg total) under the tongue every 6 (six) hours as needed. 30 tablet 1  . ipratropium (ATROVENT HFA) 17 MCG/ACT inhaler Inhale 2 puffs into the lungs every 6 (six) hours as needed for wheezing. 1 Inhaler 12  . levETIRAcetam (KEPPRA) 500 MG tablet Take 1 tablet every night 90 tablet 3  . losartan (COZAAR) 50 MG tablet TAKE 1 TABLET DAILY 90 tablet 2  . Macitentan (OPSUMIT) 10 MG TABS Take 1 tablet (10 mg total) by mouth daily. 30 tablet 11  . omeprazole (PRILOSEC) 40 MG capsule TAKE 1 CAPSULE DAILY 90 capsule 2  . potassium chloride (K-DUR) 10 MEQ tablet Take 20 mEq by mouth daily.     . rosuvastatin (CRESTOR) 5 MG tablet Take 1 tablet (5 mg total) by mouth daily. 90 tablet 1  . Selexipag (UPTRAVI) 1000 MCG TABS Take 1,000 mcg by mouth 2 (two)  times daily. 60 tablet 11  . spironolactone (ALDACTONE) 50 MG tablet TAKE 1 TABLET DAILY 90 tablet 1  . tadalafil, PAH, (ADCIRCA) 20 MG tablet TAKE 2 TABLETS (40 MG TOTAL) DAILY 60 tablet 12  . tiZANidine (ZANAFLEX) 2 MG tablet Take 0.5-2 tablets (1-4 mg total) by mouth every 6 (six) hours as needed for muscle spasms. 30 tablet 0   No current facility-administered medications for this visit.      Allergies:  Allergies  Allergen Reactions  . Aspirin Other (See Comments)    hypertension  . Nitroglycerin     Headache    Past Medical  History, Surgical history, Social history, and Family History were reviewed and updated.   Physical Exam: Blood pressure 125/73, pulse 94, temperature 98.3 F (36.8 C), temperature source Oral, resp. rate 18, height 6' (1.829 m), weight 185 lb 9.6 oz (84.2 kg), SpO2 98 %. ECOG: 1   General appearance: Comfortable appearing without any discomfort Head: Normocephalic without any trauma Oropharynx: Mucous membranes are moist and pink without any thrush or ulcers. Eyes: Pupils are equal and round reactive to light. Lymph nodes: No cervical, supraclavicular, inguinal or axillary lymphadenopathy.   Heart:regular rate and rhythm.  S1 and S2 without leg edema. Lung: Clear without any rhonchi or wheezes.  No dullness to percussion. Abdomin: Soft, nontender, nondistended with good bowel sounds.  No hepatosplenomegaly. Musculoskeletal: No joint deformity or effusion.  Full range of motion noted. Neurological: No deficits noted on motor, sensory and deep tendon reflex exam. Skin: No petechial rash or dryness.  Appeared moist.  Psychiatric: Mood and affect appeared appropriate.     Lab Results: Lab Results  Component Value Date   WBC 1.8 Repeated and verified X2. (LL) 05/04/2018   HGB 11.9 (L) 05/04/2018   HCT 35.8 (L) 05/04/2018   MCV 109.9 (H) 05/04/2018   PLT 32.0 Repeated and verified X2. (LL) 05/04/2018     Chemistry      Component Value Date/Time   NA 133 (L) 05/11/2018 0853   NA 140 06/15/2015 1414   K 4.6 05/11/2018 0853   K 3.6 06/15/2015 1414   CL 106 05/11/2018 0853   CL 106 06/15/2015 1414   CO2 20 05/11/2018 0853   CO2 17 (L) 06/15/2015 1414   BUN 45 (H) 05/11/2018 0853   BUN 18 06/15/2015 1414   CREATININE 1.88 (H) 05/11/2018 0853   CREATININE 1.63 (H) 09/17/2017 1614      Component Value Date/Time   CALCIUM 8.7 05/11/2018 0853   CALCIUM 9.5 06/15/2015 1414   ALKPHOS 209 (H) 05/11/2018 0853   ALKPHOS 145 (H) 06/15/2015 1414   AST 59 (H) 05/11/2018 0853   AST  68 (H) 06/15/2015 1414   ALT 21 05/11/2018 0853   ALT 35 06/15/2015 1414   BILITOT 1.6 (H) 05/11/2018 0853   BILITOT 1.40 06/15/2015 1414       Impression and Plan:  67 year old gentleman with the following issues:  1. Thrombocytopenia: His platelet count continues to fluctuate between 30 and 40,000 without any active bleeding.  His thrombocytopenia is related to splenomegaly as a consequence of cirrhosis of the liver.  Alcohol consumption can also directly effects platelet production.  The natural course of this finding was reviewed today with the patient as well as treatment options.  At this time he has no active bleeding noted and does not require any growth factor support or platelet transfusion.  Recommended continued observation and surveillance and consideration for platelet transfusion or growth factor  support if he is to have surgery or active bleeding is noted.  In the meantime continue to advise him about alcohol abstinence.  2. Cirrhosis of the liver: No recent decompensation noted.  3.  Leukocytopenia: Related to cirrhosis of the liver and splenic sequestration with white cell count.  No recent infection noted.  I recommended continue monitoring at this time without any growth factor support.  4. Follow-up: Will be in 7 months to follow his progress.  15 minutes was spent with the patient face-to-face today.  More than 50% of time was dedicated to reviewing the natural course of this disease and coordinating his future plan of care.      Zola Button, MD 9/18/20199:10 AM

## 2018-07-21 NOTE — Telephone Encounter (Signed)
Scheduled appt per 9/18 los - gave patient AVS and calender per los.

## 2018-07-22 ENCOUNTER — Ambulatory Visit (HOSPITAL_BASED_OUTPATIENT_CLINIC_OR_DEPARTMENT_OTHER)
Admission: RE | Admit: 2018-07-22 | Discharge: 2018-07-22 | Disposition: A | Discharge status: Home / Self Care | Payer: Medicare Other | Source: Ambulatory Visit | Attending: Internal Medicine | Admitting: Internal Medicine

## 2018-07-22 ENCOUNTER — Encounter (HOSPITAL_COMMUNITY): Payer: Self-pay | Admitting: Internal Medicine

## 2018-07-22 ENCOUNTER — Ambulatory Visit (HOSPITAL_COMMUNITY)
Admission: RE | Admit: 2018-07-22 | Discharge: 2018-07-22 | Disposition: A | Discharge status: Home / Self Care | Payer: Medicare Other | Source: Ambulatory Visit | Attending: Internal Medicine | Admitting: Internal Medicine

## 2018-07-22 VITALS — BP 113/63 | HR 74 | Wt 195.4 lb

## 2018-07-22 DIAGNOSIS — I083 Combined rheumatic disorders of mitral, aortic and tricuspid valves: Secondary | ICD-10-CM | POA: Diagnosis not present

## 2018-07-22 DIAGNOSIS — R55 Syncope and collapse: Secondary | ICD-10-CM | POA: Diagnosis not present

## 2018-07-22 DIAGNOSIS — Z79899 Other long term (current) drug therapy: Secondary | ICD-10-CM | POA: Insufficient documentation

## 2018-07-22 DIAGNOSIS — I5032 Chronic diastolic (congestive) heart failure: Secondary | ICD-10-CM | POA: Insufficient documentation

## 2018-07-22 DIAGNOSIS — K7030 Alcoholic cirrhosis of liver without ascites: Secondary | ICD-10-CM | POA: Diagnosis not present

## 2018-07-22 DIAGNOSIS — I2510 Atherosclerotic heart disease of native coronary artery without angina pectoris: Secondary | ICD-10-CM | POA: Insufficient documentation

## 2018-07-22 DIAGNOSIS — I2721 Secondary pulmonary arterial hypertension: Secondary | ICD-10-CM

## 2018-07-22 DIAGNOSIS — I130 Hypertensive heart and chronic kidney disease with heart failure and stage 1 through stage 4 chronic kidney disease, or unspecified chronic kidney disease: Secondary | ICD-10-CM | POA: Insufficient documentation

## 2018-07-22 DIAGNOSIS — K766 Portal hypertension: Secondary | ICD-10-CM

## 2018-07-22 DIAGNOSIS — N183 Chronic kidney disease, stage 3 unspecified: Secondary | ICD-10-CM

## 2018-07-22 DIAGNOSIS — I088 Other rheumatic multiple valve diseases: Secondary | ICD-10-CM | POA: Insufficient documentation

## 2018-07-22 DIAGNOSIS — J449 Chronic obstructive pulmonary disease, unspecified: Secondary | ICD-10-CM | POA: Diagnosis not present

## 2018-07-22 DIAGNOSIS — G4733 Obstructive sleep apnea (adult) (pediatric): Secondary | ICD-10-CM | POA: Diagnosis not present

## 2018-07-22 DIAGNOSIS — R569 Unspecified convulsions: Secondary | ICD-10-CM | POA: Diagnosis not present

## 2018-07-22 DIAGNOSIS — D61818 Other pancytopenia: Secondary | ICD-10-CM | POA: Diagnosis not present

## 2018-07-22 LAB — BASIC METABOLIC PANEL
Anion gap: 8 (ref 5–15)
BUN: 25 mg/dL — AB (ref 8–23)
CALCIUM: 8.9 mg/dL (ref 8.9–10.3)
CHLORIDE: 108 mmol/L (ref 98–111)
CO2: 20 mmol/L — AB (ref 22–32)
CREATININE: 1.7 mg/dL — AB (ref 0.61–1.24)
GFR calc Af Amer: 46 mL/min — ABNORMAL LOW (ref >60)
GFR calc non Af Amer: 40 mL/min — ABNORMAL LOW (ref >60)
Glucose, Bld: 117 mg/dL — ABNORMAL HIGH (ref 70–99)
POTASSIUM: 4.6 mmol/L (ref 3.5–5.1)
Sodium: 136 mmol/L (ref 135–145)

## 2018-07-22 LAB — CUP PACEART REMOTE DEVICE CHECK
Date Time Interrogation Session: 20190812013730
Implantable Pulse Generator Implant Date: 20180301

## 2018-07-22 LAB — BRAIN NATRIURETIC PEPTIDE: B NATRIURETIC PEPTIDE 5: 264.2 pg/mL — AB (ref 0.0–100.0)

## 2018-07-22 NOTE — Progress Notes (Signed)
ADVANCED HF CLINIC NOTE  Patient ID: Darren Mueller, male   DOB: May 30, 1951, 67 y.o.   MRN: [id number] GI : Dr Ardis Hughs PCP: Dr Charlett Blake  HPI:  Darren Mueller ("[name]") is a 67 yo with history of COPD, portopulmonary hypertension with RV failure, and ETOH cirrhosis.  Admitted in 6/17 with CP and SOB. Was diuresed. Troponins normal. Underwent R/L cath. Minimal CAD with moderate PAH and preserved cardiac output.   Admitted 06/2016 and 07/2016 with syncope.  On September admission found to have elevated ETOH level as well as R 6th rib fracture.   Wore 30 day monitor up until 08/28/16. No AF noted. No events.  9% tachycardia.   Admitted February 2018 after syncopal event. LINQ placed.   Admitted 7/4 through 05/08/17 with volume depletion. Diuretics held and changed to as needed for weight 200 pounds or greater. Discharge weight was 194 pounds.    Presents today for HF follow up. Recently saw Dr. Marval Mueller on 07/07/18. Told he had stage III CKD. No change in meds. Says he has been feeling bad recently. Struggling with diarrhea and cramping. Fluid doing ok. Weight stable at home. Still taking care of his mother. Runge with ADLs but SOB if he does more. Unable to go up steps. No orthopnea or PND.   PAH meds 1) Macitnentan 10 mg daily 2) Adcirca 40 mg daily  3) Selexipeg  1000/1200 daily  Studies: ECHO 12/14 EF 55-60% Peak PA pressure 35. Severe RV dysfunction  ECHO 7/15 EF 60% RV moderately to severely dilated. Moderate HK RVSP 74m HG  ECHO 6/16 EF 60% RV moderately to severely dilated. Severe HK RVSP ~65 mm HG. D-shaped septum Echo 2/17 LVEF 60-65% RV massively dilated. Flat septum. Severe HK. Moderate TR RVSP ~65. IVC small. No effusion  ECHO 01/01/2017 EF 50-55% Grade I DD. RV severely dilated. Peak PA pressure 62 mm hg  Cath 6/17 Mid RCA lesion, 20% stenosed. Dist LAD lesion, 20% stenosed. Ao = 107/70 (87) LV = 106/3/11 RA = 4 RV = 74/11 PA = 74/26 (45) PCW = 6 Fick cardiac  output/index = 4.6.2.2 PVR = 8.5 Ao sat = 95% PA sat = 61%, 58%  RHC 2/17 RA = 11 RV = 80/16/19 PA = 83/61 (71) PCW = 9 Fick cardiac output/index = 5.6/2.6 PVR = 10.3 WU Ao sat = 94% PA sat = 65%, 68% High SVC sat = 65%  RHC 2/14: RA = 13  RV = 63/5/14  PA = 65/24 (41)  PCW = 6  Hepatic wedge = 21  Fick cardiac output/index = 3.8/1.8  PVR = 9.1  FA sat = 94%  PA sat = 58%, 56%  SVC = 54%   PFTs 3/14 FEV1 2.02 L (58%) FVC 2.7L (54%) FEV1/FVC (89%) DLCO 53%  PFTs 1/17 FEV1 2.05 L (61%) FVC 2.48L (56%) DLCO 44%  Event Monitor - NSR 10/26/13.   Ab u/s 8/16: + cirrhosis/mild to moderate splenomegaly  6 min walk 01/10/13, 1290 feet  6MW (01/10/14) = 1280 feet (380 m) 6MW (9/15) = 1350 feet (411 m) O2 sats ranged from 89-95% on room air, HR ranged from 100-129. 6MW (6/16) = 384 meters  6MW (2/17) = 1250 feet (3899m6MW (8/17) = 1320 feet      Current Outpatient Medications on File Prior to Encounter  Medication Sig Dispense Refill  . Albuterol Sulfate (PROAIR RESPICLICK) [id number] Base) MCG/ACT AEPB Inhale 2 puffs into the lungs every 6 (six) hours as needed (SOB or wheezing).    .Marland Kitchen  allopurinol (ZYLOPRIM) 100 MG tablet Take 2 tablets (200 mg total) by mouth daily. 180 tablet 1  . cholestyramine (QUESTRAN) 4 g packet Take 1 packet (4 g total) by mouth 2 (two) times daily. 60 each 12  . colchicine 0.6 MG tablet Take 1 tablet (0.6 mg total) by mouth 2 (two) times daily as needed. 60 tablet 1  . fluticasone (FLONASE) 50 MCG/ACT nasal spray Place 2 sprays into both nostrils daily. 16 g 1  . fluticasone-salmeterol (ADVAIR HFA) 230-21 MCG/ACT inhaler Inhale 2 puffs into the lungs every morning.    . furosemide (LASIX) 40 MG tablet Take 1 tablet (40 mg total) by mouth 3 (three) times a week. Every Mon, Wed and Friday 180 tablet 2  . hyoscyamine (LEVSIN SL) 0.125 MG SL tablet Place 1 tablet (0.125 mg total) under the tongue every 6 (six) hours as needed. 30 tablet 1  .  ipratropium (ATROVENT HFA) 17 MCG/ACT inhaler Inhale 2 puffs into the lungs every 6 (six) hours as needed for wheezing. 1 Inhaler 12  . levETIRAcetam (KEPPRA) 500 MG tablet Take 1 tablet every night 90 tablet 3  . losartan (COZAAR) 50 MG tablet TAKE 1 TABLET DAILY 90 tablet 2  . Macitentan (OPSUMIT) 10 MG TABS Take 1 tablet (10 mg total) by mouth daily. 30 tablet 11  . potassium chloride (K-DUR) 10 MEQ tablet Take 20 mEq by mouth daily.     . rosuvastatin (CRESTOR) 5 MG tablet Take 1 tablet (5 mg total) by mouth daily. 90 tablet 1  . Selexipag (UPTRAVI) 1000 MCG TABS Take 1,000 mcg by mouth 2 (two) times daily. 60 tablet 11  . spironolactone (ALDACTONE) 50 MG tablet TAKE 1 TABLET DAILY 90 tablet 1  . tadalafil, PAH, (ADCIRCA) 20 MG tablet TAKE 2 TABLETS (40 MG TOTAL) DAILY 60 tablet 12  . tiZANidine (ZANAFLEX) 2 MG tablet Take 0.5-2 tablets (1-4 mg total) by mouth every 6 (six) hours as needed for muscle spasms. 30 tablet 0  . omeprazole (PRILOSEC) 40 MG capsule TAKE 1 CAPSULE DAILY 90 capsule 2   No current facility-administered medications on file prior to encounter.     Objective:   Weight Range:  Vital Signs:   Pulse Rate:  [74] 74 (09/19 1157) BP: (113)/(63) 113/63 (09/19 1157) SpO2:  [100 %] 100 % (09/19 1157) Weight:  [88.6 kg (195 lb 6 oz)] 88.6 kg (195 lb 6 oz) (09/19 1157)       Wt Readings from Last 3 Encounters:  07/22/18 88.6 kg (195 lb 6 oz)  07/21/18 84.2 kg (185 lb 9.6 oz)  05/07/18 90.7 kg (200 lb)    Physical Exam: General:  Well appearing. No resp difficulty HEENT: normal Neck: supple. JVP 7-8. Carotids 2+ bilat; no bruits. No lymphadenopathy or thryomegaly appreciated. Cor: PMI nondisplaced. Regular rate & rhythm. + RV lift 2/6 TR Lungs: clear Abdomen: soft, nontender, nondistended. No hepatosplenomegaly. No bruits or masses. Good bowel sounds. Extremities: no cyanosis, clubbing, rash, trace edema Neuro: alert & orientedx3, cranial nerves grossly intact.  moves all 4 extremities w/o difficulty. Affect pleasant    Assessment/Plan    1. PAH: Suspected portopulmonary HTN in setting of ETOH cirrhosis. - Stable NYHA III.  - Echo today with EF 55% RV markedly dilated and hypokinetic with D-shaped septum  - Dr. Marval Mueller changed lasix from 58m MWF to daily.  Will keep this daily dose for now. Can decreases if creatinine climbing - Continue selexipag 1000 mcg twice a day. Intolerant higher dose  due to diarrhea and headaches .  - Continue macitentan 10 daily and adcirca 40 mg daily. - With RV strain on echo may need to consider IV therapies in the near future.  - 6MW today 1280 feet (390 meters) - Discussed need for complete ETOH cessation.  2. CKD III:  - Baseline creatinine 1.5-2.0 - Followed by Dr. Marval Mueller  - Check labs today 3. Cirrhosis:  - Likely combination of RV failure and ETOH - Follows with Dr. Ardis Hughs.  4. Recurrent syncope/seizure - by history event sounds like a seizure but need to make sure not cardiac source. Seen in ED 1/15. ETOH level high,. CT brain ok.  - LINQ interrogated personally and no arrhythmias and thus most likely seizure - Continue Keppra. Follows with Dr. Delice Lesch 5. HTN:  - Blood pressure well controlled. Continue current regimen. 6. OSA:  - Has mild OSA with AHI 12 and desats down to 82%.  - Intolerant CPAP.  7. Pancytopenia:  - Has seen Dr. Alen Blew in Hematology - felt like it is due to splenic sequestration.  8. A/C Diastolic Heart Failure - Volume status ok  Glori Bickers, MD  12:44 PM

## 2018-07-22 NOTE — Patient Instructions (Signed)
Routine lab work today. Will notify you of abnormal results, otherwise no news is good news!  NO changes to medication at this time.  6 minute walk today.  Follow up 4 months with Dr. Haroldine Laws. We will call you closer to this time, or you may call our office to schedule 1 month before you are due to be seen. Take all medication as prescribed the day of your appointment. Bring all medications with you to your appointment.  Do the following things EVERYDAY: 1) Weigh yourself in the morning before breakfast. Write it down and keep it in a log. 2) Take your medicines as prescribed 3) Eat low salt foods-Limit salt (sodium) to 2000 mg per day.  4) Stay as active as you can everyday 5) Limit all fluids for the day to less than 2 liters

## 2018-07-22 NOTE — Progress Notes (Signed)
  Echocardiogram 2D Echocardiogram has been performed.  Darren Mueller 07/22/2018, 12:02 PM

## 2018-07-31 LAB — CUP PACEART REMOTE DEVICE CHECK
Implantable Pulse Generator Implant Date: 20180301
MDC IDC SESS DTM: 20190914020602

## 2018-08-09 ENCOUNTER — Encounter: Payer: Self-pay | Admitting: Family Medicine

## 2018-08-09 ENCOUNTER — Ambulatory Visit (INDEPENDENT_AMBULATORY_CARE_PROVIDER_SITE_OTHER): Payer: Medicare Other | Admitting: Family Medicine

## 2018-08-09 ENCOUNTER — Telehealth: Payer: Self-pay

## 2018-08-09 VITALS — BP 126/70 | HR 74 | Temp 97.8°F | Resp 18 | Wt 194.4 lb

## 2018-08-09 DIAGNOSIS — R739 Hyperglycemia, unspecified: Secondary | ICD-10-CM | POA: Diagnosis not present

## 2018-08-09 DIAGNOSIS — N2889 Other specified disorders of kidney and ureter: Secondary | ICD-10-CM

## 2018-08-09 DIAGNOSIS — E785 Hyperlipidemia, unspecified: Secondary | ICD-10-CM

## 2018-08-09 DIAGNOSIS — I10 Essential (primary) hypertension: Secondary | ICD-10-CM

## 2018-08-09 DIAGNOSIS — D649 Anemia, unspecified: Secondary | ICD-10-CM | POA: Diagnosis not present

## 2018-08-09 DIAGNOSIS — D696 Thrombocytopenia, unspecified: Secondary | ICD-10-CM

## 2018-08-09 DIAGNOSIS — R197 Diarrhea, unspecified: Secondary | ICD-10-CM | POA: Diagnosis not present

## 2018-08-09 DIAGNOSIS — E039 Hypothyroidism, unspecified: Secondary | ICD-10-CM | POA: Diagnosis not present

## 2018-08-09 DIAGNOSIS — N183 Chronic kidney disease, stage 3 unspecified: Secondary | ICD-10-CM

## 2018-08-09 DIAGNOSIS — G40909 Epilepsy, unspecified, not intractable, without status epilepticus: Secondary | ICD-10-CM | POA: Diagnosis not present

## 2018-08-09 DIAGNOSIS — Z23 Encounter for immunization: Secondary | ICD-10-CM | POA: Diagnosis not present

## 2018-08-09 DIAGNOSIS — F1029 Alcohol dependence with unspecified alcohol-induced disorder: Secondary | ICD-10-CM

## 2018-08-09 LAB — COMPREHENSIVE METABOLIC PANEL
ALK PHOS: 223 U/L — AB (ref 39–117)
ALT: 21 U/L (ref 0–53)
AST: 63 U/L — AB (ref 0–37)
Albumin: 3.7 g/dL (ref 3.5–5.2)
BILIRUBIN TOTAL: 1.4 mg/dL — AB (ref 0.2–1.2)
BUN: 33 mg/dL — AB (ref 6–23)
CALCIUM: 8.6 mg/dL (ref 8.4–10.5)
CHLORIDE: 111 meq/L (ref 96–112)
CO2: 20 mEq/L (ref 19–32)
CREATININE: 1.86 mg/dL — AB (ref 0.40–1.50)
GFR: 46.62 mL/min — ABNORMAL LOW (ref >60.00)
Glucose, Bld: 107 mg/dL — ABNORMAL HIGH (ref 70–99)
Potassium: 4.7 mEq/L (ref 3.5–5.1)
Sodium: 138 mEq/L (ref 135–145)
Total Protein: 7.1 g/dL (ref 6.0–8.3)

## 2018-08-09 LAB — HEMOGLOBIN A1C: Hgb A1c MFr Bld: 4.8 % (ref 4.6–6.5)

## 2018-08-09 LAB — CBC
HCT: 31.5 % — ABNORMAL LOW (ref 39.0–52.0)
Hemoglobin: 10.7 g/dL — ABNORMAL LOW (ref 13.0–17.0)
MCHC: 33.8 g/dL (ref 30.0–36.0)
MCV: 107.7 fl — AB (ref 78.0–100.0)
RBC: 2.93 Mil/uL — AB (ref 4.22–5.81)
RDW: 14.1 % (ref 11.5–15.5)

## 2018-08-09 LAB — LIPID PANEL
CHOLESTEROL: 109 mg/dL (ref 0–200)
HDL: 48.2 mg/dL (ref >39.00)
LDL CALC: 38 mg/dL (ref 0–99)
NonHDL: 60.83
TRIGLYCERIDES: 114 mg/dL (ref 0.0–149.0)
Total CHOL/HDL Ratio: 2
VLDL: 22.8 mg/dL (ref 0.0–40.0)

## 2018-08-09 LAB — FERRITIN: FERRITIN: 235.5 ng/mL (ref 22.0–322.0)

## 2018-08-09 LAB — TSH: TSH: 2.39 u[IU]/mL (ref 0.35–4.50)

## 2018-08-09 MED ORDER — LEVETIRACETAM 500 MG PO TABS: ORAL_TABLET | ORAL | 1 refills | Status: DC

## 2018-08-09 MED ORDER — HYOSCYAMINE SULFATE 0.125 MG SL SUBL: 0.1250 mg | SUBLINGUAL_TABLET | Freq: Four times a day (QID) | SUBLINGUAL | 3 refills | Status: DC | PRN

## 2018-08-09 MED FILL — OSCIMIN SL 0.125 MG TABLET: 0.125 | 23 days supply | Qty: 90 | Fill #0

## 2018-08-09 MED FILL — levETIRAcetam 500 MG TABS: 500 | 90 days supply | Qty: 90 | Fill #0

## 2018-08-09 NOTE — Patient Instructions (Addendum)
Try the Cholstyramine again and see if that helps  Anemia Anemia is a condition in which you do not have enough red blood cells or hemoglobin. Hemoglobin is a substance in red blood cells that carries oxygen. When you do not have enough red blood cells or hemoglobin (are anemic), your body cannot get enough oxygen and your organs may not work properly. As a result, you may feel very tired or have other problems. What are the causes? Common causes of anemia include:  Excessive bleeding. Anemia can be caused by excessive bleeding inside or outside the body, including bleeding from the intestine or from periods in women.  Poor nutrition.  Long-lasting (chronic) kidney, thyroid, and liver disease.  Bone marrow disorders.  Cancer and treatments for cancer.  HIV (human immunodeficiency virus) and AIDS (acquired immunodeficiency syndrome).  Treatments for HIV and AIDS.  Spleen problems.  Blood disorders.  Infections, medicines, and autoimmune disorders that destroy red blood cells.  What are the signs or symptoms? Symptoms of this condition include:  Minor weakness.  Dizziness.  Headache.  Feeling heartbeats that are irregular or faster than normal (palpitations).  Shortness of breath, especially with exercise.  Paleness.  Cold sensitivity.  Indigestion.  Nausea.  Difficulty sleeping.  Difficulty concentrating.  Symptoms may occur suddenly or develop slowly. If your anemia is mild, you may not have symptoms. How is this diagnosed? This condition is diagnosed based on:  Blood tests.  Your medical history.  A physical exam.  Bone marrow biopsy.  Your health care provider may also check your stool (feces) for blood and may do additional testing to look for the cause of your bleeding. You may also have other tests, including:  Imaging tests, such as a CT scan or MRI.  Endoscopy.  Colonoscopy.  How is this treated? Treatment for this condition depends on  the cause. If you continue to lose a lot of blood, you may need to be treated at a hospital. Treatment may include:  Taking supplements of iron, vitamin W46, or folic acid.  Taking a hormone medicine (erythropoietin) that can help to stimulate red blood cell growth.  Having a blood transfusion. This may be needed if you lose a lot of blood.  Making changes to your diet.  Having surgery to remove your spleen.  Follow these instructions at home:  Take over-the-counter and prescription medicines only as told by your health care provider.  Take supplements only as told by your health care provider.  Follow any diet instructions that you were given.  Keep all follow-up visits as told by your health care provider. This is important. Contact a health care provider if:  You develop new bleeding anywhere in the body. Get help right away if:  You are very weak.  You are short of breath.  You have pain in your abdomen or chest.  You are dizzy or feel faint.  You have trouble concentrating.  You have bloody or black, tarry stools.  You vomit repeatedly or you vomit up blood. Summary  Anemia is a condition in which you do not have enough red blood cells or enough of a substance in your red blood cells that carries oxygen (hemoglobin).  Symptoms may occur suddenly or develop slowly.  If your anemia is mild, you may not have symptoms.  This condition is diagnosed with blood tests as well as a medical history and physical exam. Other tests may be needed.  Treatment for this condition depends on the cause of  the anemia. This information is not intended to replace advice given to you by your health care provider. Make sure you discuss any questions you have with your health care provider. Document Released: 11/27/2004 Document Revised: 11/21/2016 Document Reviewed: 11/21/2016 Elsevier Interactive Patient Education  [employer].

## 2018-08-09 NOTE — Assessment & Plan Note (Signed)
Refilled Keppra today, follows with [facility]y

## 2018-08-09 NOTE — Assessment & Plan Note (Signed)
hgba1c acceptable, minimize simple carbs. Increase exercise as tolerated.  

## 2018-08-09 NOTE — Telephone Encounter (Signed)
CRITICAL VALUE STICKER  CRITICAL VALUE: WBC 1.4 , Platelet 17  RECEIVER (on-site recipient of call): Larose Kells  DATE & TIME NOTIFIED: 08/09/18 & 12:32 pm   MESSENGER (representative from lab): [facility]  MD NOTIFIED: Dr. Charlett Blake  TIME OF NOTIFICATION: 12:35 pm  RESPONSE: Provider made aware and asked for note with results routed. TC, CMA

## 2018-08-09 NOTE — Telephone Encounter (Signed)
Pt made aware of lab results. Lab recheck scheduled for one month.

## 2018-08-09 NOTE — Assessment & Plan Note (Signed)
On Levothyroxine, continue to monitor 

## 2018-08-09 NOTE — Assessment & Plan Note (Signed)
Well controlled, no changes to meds. Encouraged heart healthy diet such as the DASH diet and exercise as tolerated.

## 2018-08-09 NOTE — Assessment & Plan Note (Signed)
Check the cbc

## 2018-08-09 NOTE — Telephone Encounter (Signed)
Notify patient his platelets are down lower again. He needs to watch for signs of bleeding and seek care if it occurs and then recheck cbc in 1 month please arrange

## 2018-08-09 NOTE — Assessment & Plan Note (Signed)
Check cmp , follows with nephrology

## 2018-08-09 NOTE — Assessment & Plan Note (Signed)
Encouraged heart healthy diet, increase exercise, avoid trans fats, consider a krill oil cap daily 

## 2018-08-10 ENCOUNTER — Encounter: Payer: Self-pay | Admitting: Family Medicine

## 2018-08-15 NOTE — Assessment & Plan Note (Signed)
Check cultures and use fiber supplements twice daily.

## 2018-08-15 NOTE — Progress Notes (Signed)
Subjective:    Patient ID: Darren Mueller, male    DOB: [birth year], 67 y.o.   MRN: [id number]  No chief complaint on file.   HPI Patient is in today for follow up. He continues to struggle with frequent loose stool but he denies any bloody or tarry stool. No recent febrile illness or hospitalizations. He notes the Hyoscyamine is helpful during cramping. Denies CP/palp/SOB/HA/congestion/fevers or GU c/o. Taking meds as prescribed. He notes he continues to drink alcohol but he has cut down.   Past Medical History:  Diagnosis Date  . Alcohol dependence ([place]) 03/22/2011   In remission   . Alcohol dependence in remission ([place]) 03/22/2011   In remission   . Alcoholic cirrhosis of liver with ascites ([place]) 2014  . Dermatitis 06/10/2015  . Diarrhea 09/04/2013  . Elbow pain, left 03/17/2017  . GERD (gastroesophageal reflux disease)   . Gout   . Heart murmur   . Hx of colonic polyps   . Hypertension   . Hypertriglyceridemia 03/17/2017  . Hypopotassemia   . Otalgia 11/28/2013  . Other chronic pulmonary heart diseases   . Preventative health care 06/29/2016  . Red eye 03/03/2016   right  . Renal insufficiency 07/18/2013  . Seizures ([place])   . Sleep apnea    does not wear CPAP  . Thrombocytopenia ([place]) 06/29/2016  . Unspecified hypothyroidism 01/25/2013  . Unspecified pleural effusion     Past Surgical History:  Procedure Laterality Date  . CARDIAC CATHETERIZATION N/A 12/21/2015   Procedure: Right Heart Cath;  Surgeon: Jolaine Artist, MD;  Location: [facility];  Service: Cardiovascular;  Laterality: N/A;  . CARDIAC CATHETERIZATION N/A 04/15/2016   Procedure: Right/Left Heart Cath and Coronary Angiography;  Surgeon: Jolaine Artist, MD;  Location: [facility];  Service: Cardiovascular;  Laterality: N/A;  . COLONOSCOPY N/A 12/08/2013   Procedure: COLONOSCOPY;  Surgeon: Milus Banister, MD;  Location: [facility];  Service: Endoscopy;  Laterality: N/A;  .  ESOPHAGOGASTRODUODENOSCOPY (EGD) WITH PROPOFOL N/A 03/27/2016   Procedure: ESOPHAGOGASTRODUODENOSCOPY (EGD) WITH PROPOFOL;  Surgeon: Milus Banister, MD;  Location: [facility];  Service: Endoscopy;  Laterality: N/A;  . LOOP RECORDER INSERTION N/A 01/01/2017   Procedure: Loop Recorder Insertion;  Surgeon: Thompson Grayer, MD;  Location: [facility];  Service: Cardiovascular;  Laterality: N/A;  . RIGHT HEART CATHETERIZATION Right 06/06/2014   Procedure: RIGHT HEART CATH;  Surgeon: Jolaine Artist, MD;  Location: [facility];  Service: Cardiovascular;  Laterality: Right;  . UVULOPALATOPHARYNGOPLASTY  1999    Family History  Problem Relation Age of Onset  . Heart disease Mother   . Heart attack Mother   . Hypertension Mother   . Kidney failure Mother   . Liver disease Mother        stage 4 liver disease  . Prostate cancer Father   . Cancer Father        lung  . Hypertension Brother   . Gout Brother   . Alcoholism Maternal Grandfather   . Liver disease Maternal Grandfather   . Migraines Sister   . Hypertension Brother   . Colon cancer Neg Hx     Social History   Socioeconomic History  . Marital status: Divorced    Spouse name: Not on file  . Number of children: 3  . Years of education: Not on file  . Highest education level: Not on file  Occupational History  . Occupation: Retired    Comment:  Social research officer, government  Social Needs  . Financial resource strain: Not on file  . Food insecurity:    Worry: Not on file    Inability: Not on file  . Transportation needs:    Medical: Not on file    Non-medical: Not on file  Tobacco Use  . Smoking status: Former Smoker    Packs/day: 2.00    Years: 5.00    Pack years: 10.00    Types: Cigarettes    Last attempt to quit: 09/22/1979    Years since quitting: 38.9  . Smokeless tobacco: Never Used  Substance and Sexual Activity  . Alcohol use: Yes    Alcohol/week: 6.0 standard drinks    Types: 6 Shots of liquor per week    Comment: social  drinker  . Drug use: No  . Sexual activity: Yes    Birth control/protection: Spermicide  Lifestyle  . Physical activity:    Days per week: Not on file    Minutes per session: Not on file  . Stress: Not on file  Relationships  . Social connections:    Talks on phone: Not on file    Gets together: Not on file    Attends religious service: Not on file    Active member of club or organization: Not on file    Attends meetings of clubs or organizations: Not on file    Relationship status: Not on file  . Intimate partner violence:    Fear of current or ex partner: Not on file    Emotionally abused: Not on file    Physically abused: Not on file    Forced sexual activity: Not on file  Other Topics Concern  . Not on file  Social History Narrative   0 caffeine drinks daily     Outpatient Medications Prior to Visit  Medication Sig Dispense Refill  . loperamide (IMODIUM) 2 MG capsule Take 2 mg by mouth as needed for diarrhea or loose stools.    . Albuterol Sulfate (PROAIR RESPICLICK) 419 (90 Base) MCG/ACT AEPB Inhale 2 puffs into the lungs every 6 (six) hours as needed (SOB or wheezing).    Marland Kitchen allopurinol (ZYLOPRIM) 100 MG tablet Take 2 tablets (200 mg total) by mouth daily. 180 tablet 1  . cholestyramine (QUESTRAN) 4 g packet Take 1 packet (4 g total) by mouth 2 (two) times daily. 60 each 12  . colchicine 0.6 MG tablet Take 1 tablet (0.6 mg total) by mouth 2 (two) times daily as needed. 60 tablet 1  . fluticasone (FLONASE) 50 MCG/ACT nasal spray Place 2 sprays into both nostrils daily. 16 g 1  . fluticasone-salmeterol (ADVAIR HFA) 230-21 MCG/ACT inhaler Inhale 2 puffs into the lungs every morning.    . furosemide (LASIX) 40 MG tablet Take 1 tablet (40 mg total) by mouth 3 (three) times a week. Every Mon, Wed and Friday 180 tablet 2  . ipratropium (ATROVENT HFA) 17 MCG/ACT inhaler Inhale 2 puffs into the lungs every 6 (six) hours as needed for wheezing. 1 Inhaler 12  . losartan (COZAAR) 50  MG tablet TAKE 1 TABLET DAILY 90 tablet 2  . Macitentan (OPSUMIT) 10 MG TABS Take 1 tablet (10 mg total) by mouth daily. 30 tablet 11  . omeprazole (PRILOSEC) 40 MG capsule TAKE 1 CAPSULE DAILY 90 capsule 2  . potassium chloride (K-DUR) 10 MEQ tablet Take 20 mEq by mouth daily.     . rosuvastatin (CRESTOR) 5 MG tablet Take 1 tablet (5 mg total) by  mouth daily. 90 tablet 1  . Selexipag (UPTRAVI) 1000 MCG TABS Take 1,000 mcg by mouth 2 (two) times daily. 60 tablet 11  . spironolactone (ALDACTONE) 50 MG tablet TAKE 1 TABLET DAILY 90 tablet 1  . tadalafil, PAH, (ADCIRCA) 20 MG tablet TAKE 2 TABLETS (40 MG TOTAL) DAILY 60 tablet 12  . tiZANidine (ZANAFLEX) 2 MG tablet Take 0.5-2 tablets (1-4 mg total) by mouth every 6 (six) hours as needed for muscle spasms. 30 tablet 0  . hyoscyamine (LEVSIN SL) 0.125 MG SL tablet Place 1 tablet (0.125 mg total) under the tongue every 6 (six) hours as needed. 30 tablet 1  . levETIRAcetam (KEPPRA) 500 MG tablet Take 1 tablet every night 90 tablet 3   No facility-administered medications prior to visit.     Allergies  Allergen Reactions  . Aspirin Other (See Comments)    hypertension  . Nitroglycerin     Headache    Review of Systems  Constitutional: Positive for malaise/fatigue. Negative for fever.  HENT: Negative for congestion.   Eyes: Negative for blurred vision.  Respiratory: Negative for shortness of breath and wheezing.   Cardiovascular: Negative for chest pain, palpitations and leg swelling.  Gastrointestinal: Positive for diarrhea. Negative for abdominal pain, blood in stool, nausea and vomiting.  Genitourinary: Negative for dysuria and frequency.  Musculoskeletal: Negative for falls.  Skin: Negative for rash.  Neurological: Negative for dizziness, loss of consciousness and headaches.  Endo/Heme/Allergies: Negative for environmental allergies.  Psychiatric/Behavioral: Negative for depression. The patient is not nervous/anxious.          Objective:    Physical Exam  Constitutional: He is oriented to person, place, and time. He appears well-developed and well-nourished. No distress.  HENT:  Head: Normocephalic and atraumatic.  Nose: Nose normal.  Eyes: Right eye exhibits no discharge. Left eye exhibits no discharge.  Neck: Normal range of motion. Neck supple.  Cardiovascular: Normal rate and regular rhythm.  Murmur heard. Pulmonary/Chest: Effort normal and breath sounds normal.  Abdominal: Soft. Bowel sounds are normal. There is no tenderness.  Musculoskeletal: He exhibits no edema.  Neurological: He is alert and oriented to person, place, and time.  Skin: Skin is warm and dry.  Psychiatric: He has a normal mood and affect.  Nursing note and vitals reviewed.   BP 126/70 (BP Location: Left Arm, Patient Position: Sitting, Cuff Size: Normal)   Pulse 74   Temp 97.8 F (36.6 C) (Oral)   Resp 18   Wt 194 lb 6.4 oz (88.2 kg)   SpO2 98%   BMI 26.37 kg/m  Wt Readings from Last 3 Encounters:  08/09/18 194 lb 6.4 oz (88.2 kg)  07/22/18 195 lb 6 oz (88.6 kg)  07/21/18 185 lb 9.6 oz (84.2 kg)     Lab Results  Component Value Date   WBC 1.4 Repeated and verified X2. (LL) 08/09/2018   HGB 10.7 (L) 08/09/2018   HCT 31.5 (L) 08/09/2018   PLT 17.0 Repeated and verified X2. (LL) 08/09/2018   GLUCOSE 107 (H) 08/09/2018   CHOL 109 08/09/2018   TRIG 114.0 08/09/2018   HDL 48.20 08/09/2018   LDLDIRECT 34.0 05/04/2018   LDLCALC 38 08/09/2018   ALT 21 08/09/2018   AST 63 (H) 08/09/2018   NA 138 08/09/2018   K 4.7 08/09/2018   CL 111 08/09/2018   CREATININE 1.86 (H) 08/09/2018   BUN 33 (H) 08/09/2018   CO2 20 08/09/2018   TSH 2.39 08/09/2018   PSA 0.49 11/01/2012  INR 1.25 06/04/2016   HGBA1C 4.8 08/09/2018    Lab Results  Component Value Date   TSH 2.39 08/09/2018   Lab Results  Component Value Date   WBC 1.4 Repeated and verified X2. (LL) 08/09/2018   HGB 10.7 (L) 08/09/2018   HCT 31.5 (L) 08/09/2018    MCV 107.7 (H) 08/09/2018   PLT 17.0 Repeated and verified X2. (LL) 08/09/2018   Lab Results  Component Value Date   NA 138 08/09/2018   K 4.7 08/09/2018   CO2 20 08/09/2018   GLUCOSE 107 (H) 08/09/2018   BUN 33 (H) 08/09/2018   CREATININE 1.86 (H) 08/09/2018   BILITOT 1.4 (H) 08/09/2018   ALKPHOS 223 (H) 08/09/2018   AST 63 (H) 08/09/2018   ALT 21 08/09/2018   PROT 7.1 08/09/2018   ALBUMIN 3.7 08/09/2018   CALCIUM 8.6 08/09/2018   ANIONGAP 8 07/22/2018   GFR 46.62 (L) 08/09/2018   Lab Results  Component Value Date   CHOL 109 08/09/2018   Lab Results  Component Value Date   HDL 48.20 08/09/2018   Lab Results  Component Value Date   LDLCALC 38 08/09/2018   Lab Results  Component Value Date   TRIG 114.0 08/09/2018   Lab Results  Component Value Date   CHOLHDL 2 08/09/2018   Lab Results  Component Value Date   HGBA1C 4.8 08/09/2018       Assessment & Plan:   Problem List Items Addressed This Visit    Hypertension (Chronic)    Well controlled, no changes to meds. Encouraged heart healthy diet such as the DASH diet and exercise as tolerated.       Chronic renal insufficiency, stage III (moderate) (HCC) (Chronic)    Check cmp , follows with nephrology      Relevant Orders   Comprehensive metabolic panel (Completed)   Alcohol dependence ([place])    He repots he has cut down but he has not stopped.       Hypothyroidism    On Levothyroxine, continue to monitor      Relevant Orders   TSH (Completed)   Diarrhea - Primary    Check cultures and use fiber supplements twice daily.       Relevant Orders   Stool, WBC/Lactoferrin   Stool Culture   Ova and parasite examination   Thrombocytopenia ([place])    Check the cbc      Relevant Orders   CBC (Completed)   Hyperlipidemia    Encouraged heart healthy diet, increase exercise, avoid trans fats, consider a krill oil cap daily      Relevant Orders   Lipid panel (Completed)   Seizure disorder (HCC)     Refilled Keppra today, follows with [facility] neurology      Relevant Medications   levETIRAcetam (KEPPRA) 500 MG tablet   Hyperglycemia    hgba1c acceptable, minimize simple carbs. Increase exercise as tolerated.       Relevant Orders   Hemoglobin A1c (Completed)    Other Visit Diagnoses    Anemia, unspecified type       Relevant Orders   Ferritin (Completed)      I am having Arnette Norris maintain his Albuterol Sulfate, fluticasone-salmeterol, spironolactone, losartan, omeprazole, Selexipag, potassium chloride, ipratropium, macitentan, rosuvastatin, fluticasone, furosemide, allopurinol, tiZANidine, cholestyramine, colchicine, tadalafil (PAH), loperamide, levETIRAcetam, and hyoscyamine.  Meds ordered this encounter  Medications  . levETIRAcetam (KEPPRA) 500 MG tablet    Sig: Take 1 tablet every night    Dispense:  90 tablet    Refill:  1  . hyoscyamine (LEVSIN SL) 0.125 MG SL tablet    Sig: Place 1 tablet (0.125 mg total) under the tongue every 6 (six) hours as needed.    Dispense:  90 tablet    Refill:  3     Penni Homans, MD

## 2018-08-15 NOTE — Assessment & Plan Note (Signed)
He repots he has cut down but he has not stopped.

## 2018-08-18 ENCOUNTER — Ambulatory Visit (INDEPENDENT_AMBULATORY_CARE_PROVIDER_SITE_OTHER): Payer: Medicare Other | Admitting: *Deleted

## 2018-08-18 DIAGNOSIS — R55 Syncope and collapse: Secondary | ICD-10-CM

## 2018-08-19 NOTE — Progress Notes (Signed)
[facility] Summary Report / Loop Recorder

## 2018-08-25 ENCOUNTER — Telehealth: Payer: Self-pay

## 2018-08-25 NOTE — Telephone Encounter (Signed)
This is a side effect of hs heart med and he needs it. He can use Imodium as needed for his symptoms and see if that helps

## 2018-08-25 NOTE — Telephone Encounter (Signed)
Please advise.

## 2018-08-25 NOTE — Telephone Encounter (Signed)
Copied from [place] [id number]. Topic: General - Other >> Aug 25, 2018  1:36 PM Yvette Rack wrote: Reason for CRM: pt calling stating that he still is having Diarrhea and cramps  from last OV and would like something else called into his pharmacy  the hyoscyamine (LEVSIN SL) 0.125 MG SL tablet didn't work and he need this fixed foe this weekend for homecoming at [employer]    [place], [place] - [place] [phone] (Phone) [phone] (Fax)

## 2018-08-26 ENCOUNTER — Other Ambulatory Visit: Payer: Self-pay

## 2018-08-26 MED ORDER — DIPHENOXYLATE-ATROPINE 2.5-0.025 MG PO TABS: 1.0000 | ORAL_TABLET | Freq: Three times a day (TID) | ORAL | 0 refills | Status: DC

## 2018-08-26 MED FILL — DIPHENOXYLATE-ATROPINE 2.5-: 2.5-0.025 | 20 days supply | Qty: 60 | Fill #0

## 2018-08-26 NOTE — Telephone Encounter (Signed)
Patient states he has been taking immodium, would like to know what else he can do. Please clarify on how often patient is to be taking immodium if that is all he can do.

## 2018-08-26 NOTE — Telephone Encounter (Signed)
Medication sent to pharmacy  

## 2018-08-26 NOTE — Telephone Encounter (Signed)
If he wants there is a prescription medicine called Lomotil he could try instead of Imodium. May or may not work better. 1 tab po tid prn diarrhea, disp #30 if he wants to try

## 2018-08-30 LAB — CUP PACEART REMOTE DEVICE CHECK
Date Time Interrogation Session: 20191017024112
MDC IDC PG IMPLANT DT: 20180301

## 2018-09-03 ENCOUNTER — Other Ambulatory Visit: Payer: Self-pay | Admitting: Family Medicine

## 2018-09-03 NOTE — Telephone Encounter (Signed)
Copied from [place] [id number]. Topic: Quick Communication - Rx Refill/Question >> Sep 03, 2018  3:05 PM Scherrie Gerlach wrote: Medication: fluticasone-salmeterol (ADVAIR HFA) 230-21 MCG/ACT inhaler Albuterol Sulfate (PROAIR RESPICLICK) 314 (90 Base) MCG/ACT AEPB  Pt wants to know if Dr Charlett Blake will do these refills.  Pt states used to do for him a long time ago. [place], [place] - [address] [phone] (Phone) [phone] (Fax)

## 2018-09-03 NOTE — Telephone Encounter (Signed)
Call place to patient to inquire about his breathing. Pt states he is feeling fine just out of medication.

## 2018-09-03 NOTE — Telephone Encounter (Signed)
Requested medication (s) are due for refill today: yes  Requested medication (s) are on the active medication list: yes (historical provider)  Last refill:  Historical  Future visit scheduled: yes 09/10/18  Notes to clinic:      Requested Prescriptions  Pending Prescriptions Disp Refills   Albuterol Sulfate (PROAIR RESPICLICK) 524 (90 Base) MCG/ACT AEPB      Sig: Inhale 2 puffs into the lungs every 6 (six) hours as needed (SOB or wheezing).     Pulmonology:  Beta Agonists Failed - 09/03/2018  4:59 PM      Failed - One inhaler should last at least one month. If the patient is requesting refills earlier, contact the patient to check for uncontrolled symptoms.      Passed - Valid encounter within last 12 months    Recent Outpatient Visits          3 weeks ago Diarrhea, unspecified type   [facility] at [place], [place]   4 months ago Low back pain with bilateral sciatica, unspecified back pain laterality, unspecified chronicity   [facility] at [place], [place]   5 months ago Right ear pain   [facility] at [place], [place]   7 months ago Renal insufficiency   [facility] at [place], [place]   8 months ago Flu   [facility] at [place], [employer] [facility]            In 2 months Mosie Lukes, MD [employer] [facility] at [employer], [place]   In 6 months [place], [place], Research scientist (physical sciences) at [employer], [facility]          fluticasone-salmeterol (ADVAIR [place]) 230-21 MCG/ACT inhaler 1 Inhaler     Sig: Inhale 2 puffs into the lungs every morning.     Pulmonology:  [employer] Passed - 09/03/2018  4:59 PM      Passed - Valid encounter within last 12 months    Recent Outpatient Visits          3 weeks ago  Diarrhea, unspecified type   [facility] at [place], [place]   4 months ago Low back pain with bilateral sciatica, unspecified back pain laterality, unspecified chronicity   Archivist at [place], [place]   5 months ago Right ear pain   Archivist at [place], [place]   7 months ago Renal insufficiency   [facility]ivist at [place], [place]   8 months ago Flu   [facility] at [place], [employer] [facility]            In 2 months Mosie Lukes, MD [employer] at [facility], [place]   In 6 months [place], [place], Research scientist (physical sciences) at [employer], [place]

## 2018-09-06 MED ORDER — FLUTICASONE-SALMETEROL 230-21 MCG/ACT IN AERO: 2.0000 | INHALATION_SPRAY | RESPIRATORY_TRACT | 5 refills | Status: DC

## 2018-09-06 MED ORDER — ALBUTEROL SULFATE 108 (90 BASE) MCG/ACT IN AEPB: 2.0000 | INHALATION_SPRAY | Freq: Four times a day (QID) | RESPIRATORY_TRACT | 5 refills | Status: DC | PRN

## 2018-09-10 ENCOUNTER — Other Ambulatory Visit (INDEPENDENT_AMBULATORY_CARE_PROVIDER_SITE_OTHER): Payer: Medicare Other

## 2018-09-10 ENCOUNTER — Telehealth: Payer: Self-pay | Admitting: Family Medicine

## 2018-09-10 DIAGNOSIS — I10 Essential (primary) hypertension: Secondary | ICD-10-CM

## 2018-09-10 LAB — CBC
HCT: 31.4 % — ABNORMAL LOW (ref 39.0–52.0)
Hemoglobin: 10.5 g/dL — ABNORMAL LOW (ref 13.0–17.0)
MCHC: 33.6 g/dL (ref 30.0–36.0)
MCV: 109.1 fl — ABNORMAL HIGH (ref 78.0–100.0)
Platelets: 26 10*3/uL — CL (ref 150.0–400.0)
RBC: 2.87 Mil/uL — AB (ref 4.22–5.81)
RDW: 14.8 % (ref 11.5–15.5)

## 2018-09-10 MED ORDER — FLUTICASONE-SALMETEROL 230-21 MCG/ACT IN AERO: 2.0000 | INHALATION_SPRAY | RESPIRATORY_TRACT | 1 refills | Status: DC

## 2018-09-10 MED ORDER — ALBUTEROL SULFATE 108 (90 BASE) MCG/ACT IN AEPB: 2.0000 | INHALATION_SPRAY | Freq: Four times a day (QID) | RESPIRATORY_TRACT | 1 refills | Status: DC | PRN

## 2018-09-10 MED FILL — COLCHICINE 0.6 MG TABS: 0.6 | 30 days supply | Qty: 60 | Fill #1

## 2018-09-10 NOTE — Telephone Encounter (Signed)
Dr Charlett Blake -- please advise regarding critical value below. [facility] requested below.

## 2018-09-10 NOTE — Telephone Encounter (Signed)
CRITICAL VALUE STICKER  CRITICAL VALUE:Plt:26.0  RECEIVER (on-site recipient of call):Angie  DATE & TIME NOTIFIED:09/10/18 @ 1:29pm  MESSENGER (representative from lab):[facility]  MD NOTIFIED:Dr. Charlett Blake  TIME OF NOTIFICATION:1:34pm  RESPONSE:Was asked to send results to provider in message.//AB/CMA

## 2018-09-10 NOTE — Telephone Encounter (Signed)
Pt came in and requested to have his Rx for [facility]ol and [facility].

## 2018-09-12 NOTE — Telephone Encounter (Signed)
hthese labs are actually improved some from his baseline. No changes. Please send in refills for his Albuterol and Advair 1 month supply with 2 rf or a 90 day supply at patient discretion. Thanks

## 2018-09-13 ENCOUNTER — Other Ambulatory Visit: Payer: Self-pay | Admitting: Family Medicine

## 2018-09-13 ENCOUNTER — Other Ambulatory Visit (HOSPITAL_COMMUNITY): Payer: Self-pay | Admitting: Cardiology

## 2018-09-13 NOTE — Telephone Encounter (Signed)
Refills were sent 09/10/18.

## 2018-09-20 ENCOUNTER — Ambulatory Visit (INDEPENDENT_AMBULATORY_CARE_PROVIDER_SITE_OTHER): Payer: Medicare Other

## 2018-09-20 DIAGNOSIS — R55 Syncope and collapse: Secondary | ICD-10-CM

## 2018-09-20 DIAGNOSIS — I10 Essential (primary) hypertension: Secondary | ICD-10-CM

## 2018-09-21 ENCOUNTER — Ambulatory Visit: Payer: Self-pay | Admitting: *Deleted

## 2018-09-21 NOTE — Progress Notes (Signed)
[facility] Summary Report / Loop Recorder

## 2018-09-21 NOTE — Telephone Encounter (Addendum)
Patient is calling to report he is having increase coughing which is causing SOB. Patient states he coughed so bad today he got dizzy. Appointment scheduled.  Reason for Disposition . SEVERE coughing spells (e.g., whooping sound after coughing, vomiting after coughing)  Answer Assessment - Initial Assessment Questions 1. ONSET: "When did the cough begin?"      Worse Sunday- has had it since last Friday 2. SEVERITY: "How bad is the cough today?"      So bad today- horse and tea is not helping- OTC not helping. 3. RESPIRATORY DISTRESS: "Describe your breathing."      Breathing is worse- SOB- patient is trying to decide which inhaler to use 4. FEVER: "Do you have a fever?" If so, ask: "What is your temperature, how was it measured, and when did it start?"     No fever 5. SPUTUM: "Describe the color of your sputum" (clear, white, yellow, green)     green 6. HEMOPTYSIS: "Are you coughing up any blood?" If so ask: "How much?" (flecks, streaks, tablespoons, etc.)     no 7. CARDIAC HISTORY: "Do you have any history of heart disease?" (e.g., heart attack, congestive heart failure)      Cardiac histry 8. LUNG HISTORY: "Do you have any history of lung disease?"  (e.g., pulmonary embolus, asthma, emphysema)     COPD 9. PE RISK FACTORS: "Do you have a history of blood clots?" (or: recent major surgery, recent prolonged travel, bedridden)     no 10. OTHER SYMPTOMS: "Do you have any other symptoms?" (e.g., runny nose, wheezing, chest pain)       Diarrhea- medication related 11. PREGNANCY: "Is there any chance you are pregnant?" "When was your last menstrual period?"       n/a 12. TRAVEL: "Have you traveled out of the country in the last month?" (e.g., travel history, exposures)       no  Protocols used: [place]

## 2018-09-22 ENCOUNTER — Ambulatory Visit (INDEPENDENT_AMBULATORY_CARE_PROVIDER_SITE_OTHER): Payer: Medicare Other | Admitting: Medical

## 2018-09-22 ENCOUNTER — Telehealth: Payer: Self-pay | Admitting: *Deleted

## 2018-09-22 ENCOUNTER — Encounter: Payer: Self-pay | Admitting: Medical

## 2018-09-22 ENCOUNTER — Ambulatory Visit (HOSPITAL_BASED_OUTPATIENT_CLINIC_OR_DEPARTMENT_OTHER)
Admission: RE | Admit: 2018-09-22 | Discharge: 2018-09-22 | Disposition: A | Discharge status: Home / Self Care | Payer: Medicare Other | Source: Ambulatory Visit | Attending: Medical | Admitting: Medical

## 2018-09-22 VITALS — BP 113/67 | HR 70 | Temp 97.7°F | Resp 16 | Ht 72.0 in | Wt 191.0 lb

## 2018-09-22 DIAGNOSIS — R059 Cough, unspecified: Secondary | ICD-10-CM

## 2018-09-22 DIAGNOSIS — I50810 Right heart failure, unspecified: Secondary | ICD-10-CM

## 2018-09-22 DIAGNOSIS — I517 Cardiomegaly: Secondary | ICD-10-CM | POA: Diagnosis not present

## 2018-09-22 DIAGNOSIS — R0602 Shortness of breath: Secondary | ICD-10-CM | POA: Diagnosis not present

## 2018-09-22 DIAGNOSIS — R0600 Dyspnea, unspecified: Secondary | ICD-10-CM

## 2018-09-22 DIAGNOSIS — J40 Bronchitis, not specified as acute or chronic: Secondary | ICD-10-CM | POA: Diagnosis not present

## 2018-09-22 DIAGNOSIS — R05 Cough: Secondary | ICD-10-CM

## 2018-09-22 DIAGNOSIS — R197 Diarrhea, unspecified: Secondary | ICD-10-CM | POA: Diagnosis not present

## 2018-09-22 LAB — CBC WITH DIFFERENTIAL/PLATELET
Basophils Absolute: 0 K/uL (ref 0.0–0.1)
Basophils Relative: 0.2 % (ref 0.0–3.0)
Eosinophils Absolute: 0 K/uL (ref 0.0–0.7)
Eosinophils Relative: 0.9 % (ref 0.0–5.0)
HCT: 33.5 % — ABNORMAL LOW (ref 39.0–52.0)
Hemoglobin: 11.2 g/dL — ABNORMAL LOW (ref 13.0–17.0)
Lymphocytes Relative: 18.9 % (ref 12.0–46.0)
Lymphs Abs: 0.4 K/uL — ABNORMAL LOW (ref 0.7–4.0)
MCHC: 33.6 g/dL (ref 30.0–36.0)
MCV: 107.9 fl — ABNORMAL HIGH (ref 78.0–100.0)
Monocytes Absolute: 0.2 K/uL (ref 0.1–1.0)
Monocytes Relative: 12.2 % — ABNORMAL HIGH (ref 3.0–12.0)
Neutro Abs: 1.4 K/uL (ref 1.4–7.7)
Neutrophils Relative %: 67.8 % (ref 43.0–77.0)
Platelets: 33 K/uL — CL (ref 150.0–400.0)
RBC: 3.1 Mil/uL — ABNORMAL LOW (ref 4.22–5.81)
RDW: 14.8 % (ref 11.5–15.5)
WBC: 2 K/uL — ABNORMAL LOW (ref 4.0–10.5)

## 2018-09-22 LAB — COMPREHENSIVE METABOLIC PANEL
ALK PHOS: 238 U/L — AB (ref 39–117)
ALT: 18 U/L (ref 0–53)
AST: 49 U/L — AB (ref 0–37)
Albumin: 3.8 g/dL (ref 3.5–5.2)
BILIRUBIN TOTAL: 1.3 mg/dL — AB (ref 0.2–1.2)
BUN: 16 mg/dL (ref 6–23)
CO2: 21 meq/L (ref 19–32)
CREATININE: 1.61 mg/dL — AB (ref 0.40–1.50)
Calcium: 8.7 mg/dL (ref 8.4–10.5)
Chloride: 107 mEq/L (ref 96–112)
GFR: 55.06 mL/min — ABNORMAL LOW (ref >60.00)
GLUCOSE: 84 mg/dL (ref 70–99)
Potassium: 4.2 mEq/L (ref 3.5–5.1)
Sodium: 136 mEq/L (ref 135–145)
Total Protein: 7.2 g/dL (ref 6.0–8.3)

## 2018-09-22 LAB — TROPONIN I: TNIDX: 0.03 ug/L (ref 0.00–0.06)

## 2018-09-22 LAB — BRAIN NATRIURETIC PEPTIDE: Pro B Natriuretic peptide (BNP): 859 pg/mL — ABNORMAL HIGH (ref 0.0–100.0)

## 2018-09-22 MED ORDER — DOXYCYCLINE HYCLATE 100 MG PO TABS: 100.0000 mg | ORAL_TABLET | Freq: Two times a day (BID) | ORAL | 0 refills | Status: DC

## 2018-09-22 MED ORDER — ROSUVASTATIN CALCIUM 5 MG PO TABS: 5.0000 mg | ORAL_TABLET | Freq: Every day | ORAL | 3 refills | Status: DC

## 2018-09-22 MED ORDER — HYDROCODONE-HOMATROPINE 5-1.5 MG/5ML PO SYRP: 5.0000 mL | ORAL_SOLUTION | Freq: Four times a day (QID) | ORAL | 0 refills | Status: DC | PRN

## 2018-09-22 MED FILL — DOXYCYCLINE HYCLATE 100 MG: 100 | 10 days supply | Qty: 20 | Fill #0

## 2018-09-22 MED FILL — HYDROCODONE-HOMATROPINE SOL: 5-1.5 | 5 days supply | Qty: 100 | Fill #0

## 2018-09-22 NOTE — Patient Instructions (Addendum)
You do appear to have recent episode of bronchitis with some shortness of breath/dyspnea.  Based you medical history would like to rule out pneumonia and see if you have any component of CHF present as you do have a history of that.  In addition with history of smoking may have some reactive airways/COPD component.  We will get a chest x-ray today along with CBC, CMP, BNP and troponin.  BNP and troponin are stat.  No obvious cardiac symptoms presently but with the shortness of breath would like to know troponin level is normal and not having any indication of demand ischemia.  For loose stools x2 weeks, I did put gastrointestinal panel ordered.  Please turn those in some time for the weekend so we can get those results back sometime early next week.  Continue medication your PCP gave her loose stools.  I am prescribing doxycycline antibiotic.  Hycodan cough syrup and I want you to continue your 2 inhalers.  If work-up does not indicate acute CHF and dyspnea is persisting then might give you a short course of tapered prednisone.  Your transient dizziness today I think was likely from standing up very quickly.  We will see if you have indicators of dehydration or anemia on lab work.  You had good neurologic exam today.  Follow-up in 5 days or as needed.

## 2018-09-22 NOTE — Telephone Encounter (Signed)
CRITICAL VALUE STICKER  CRITICAL VALUE: Platelet count  33  RECEIVER (on-site recipient of call): Carlyle Basques, CMA (Lab)  DATE & TIME NOTIFIED: 09/22/18 @ 3:50pm  MESSENGER (representative from lab): Santiago Glad  MD NOTIFIED:   TIME OF NOTIFICATION:  RESPONSE:

## 2018-09-22 NOTE — Telephone Encounter (Signed)
Ok. Will notify pt with other labs. Prior known chronic abnormality. Hx of very low platlets. Will notify when update him on all other labs today.

## 2018-09-22 NOTE — Progress Notes (Signed)
Subjective:    Patient ID: Darren Mueller, male    DOB: January 27, 1951, 67 y.o.   MRN: [id number]  HPI  Pt in for some recent chest congestion and some productive cough. Sick since last Friday. He states bringing up green mucus. Pt has felt chills. No sweats. He thinks not ran fever but no temp.   Feels some sob with activity. No chest pain reported. Pt reports used to smoke 25 years ago. Pt states used to smoke a pack a day.   He does have history of rt side side chf.  Pt did stand up quickly the other day and felt transient light headed. Also this was after severe coughing event. No dizziness since. No gross motor or sensory function deficits.     Review of Systems  Constitutional: Negative for chills, diaphoresis and fever.  HENT: Negative for congestion and drooling.   Respiratory: Positive for cough and shortness of breath. Negative for chest tightness and wheezing.        Chest congestion.  Cardiovascular: Negative for chest pain and palpitations.  Gastrointestinal: Positive for diarrhea. Negative for abdominal pain.  Musculoskeletal: Negative for back pain, myalgias and neck pain.  Skin: Negative for rash.  Neurological: Negative for dizziness, seizures, syncope, weakness, numbness and headaches.       See hpi.  Hematological: Negative for adenopathy. Does not bruise/bleed easily.  Psychiatric/Behavioral: Negative for agitation, confusion and self-injury.    Past Medical History:  Diagnosis Date  . Alcohol dependence ([place]) 03/22/2011   In remission   . Alcohol dependence in remission ([place]) 03/22/2011   In remission   . Alcoholic cirrhosis of liver with ascites ([place]) 2014  . Dermatitis 06/10/2015  . Diarrhea 09/04/2013  . Elbow pain, left 03/17/2017  . GERD (gastroesophageal reflux disease)   . Gout   . Heart murmur   . Hx of colonic polyps   . Hypertension   . Hypertriglyceridemia 03/17/2017  . Hypopotassemia   . Otalgia 11/28/2013  . Other chronic pulmonary heart  diseases   . Preventative health care 06/29/2016  . Red eye 03/03/2016   right  . Renal insufficiency 07/18/2013  . Seizures ([place])   . Sleep apnea    does not wear CPAP  . Thrombocytopenia ([place]) 06/29/2016  . Unspecified hypothyroidism 01/25/2013  . Unspecified pleural effusion      Social History   Socioeconomic History  . Marital status: Divorced    Spouse name: Not on file  . Number of children: 3  . Years of education: Not on file  . Highest education level: Not on file  Occupational History  . Occupation: Retired    Comment: Microbiologist  . Financial resource strain: Not on file  . Food insecurity:    Worry: Not on file    Inability: Not on file  . Transportation needs:    Medical: Not on file    Non-medical: Not on file  Tobacco Use  . Smoking status: Former Smoker    Packs/day: 2.00    Years: 5.00    Pack years: 10.00    Types: Cigarettes    Last attempt to quit: 09/22/1979    Years since quitting: 39.0  . Smokeless tobacco: Never Used  Substance and Sexual Activity  . Alcohol use: Yes    Alcohol/week: 6.0 standard drinks    Types: 6 Shots of liquor per week    Comment: social drinker  . Drug use: No  . Sexual activity: Yes  Birth control/protection: Spermicide  Lifestyle  . Physical activity:    Days per week: Not on file    Minutes per session: Not on file  . Stress: Not on file  Relationships  . Social connections:    Talks on phone: Not on file    Gets together: Not on file    Attends religious service: Not on file    Active member of club or organization: Not on file    Attends meetings of clubs or organizations: Not on file    Relationship status: Not on file  . Intimate partner violence:    Fear of current or ex partner: Not on file    Emotionally abused: Not on file    Physically abused: Not on file    Forced sexual activity: Not on file  Other Topics Concern  . Not on file  Social History Narrative   0 caffeine drinks daily      Past Surgical History:  Procedure Laterality Date  . CARDIAC CATHETERIZATION N/A 12/21/2015   Procedure: Right Heart Cath;  Surgeon: Jolaine Artist, MD;  Location: [facility];  Service: Cardiovascular;  Laterality: N/A;  . CARDIAC CATHETERIZATION N/A 04/15/2016   Procedure: Right/Left Heart Cath and Coronary Angiography;  Surgeon: Jolaine Artist, MD;  Location: [facility];  Service: Cardiovascular;  Laterality: N/A;  . COLONOSCOPY N/A 12/08/2013   Procedure: COLONOSCOPY;  Surgeon: Milus Banister, MD;  Location: [facility];  Service: Endoscopy;  Laterality: N/A;  . ESOPHAGOGASTRODUODENOSCOPY (EGD) WITH PROPOFOL N/A 03/27/2016   Procedure: ESOPHAGOGASTRODUODENOSCOPY (EGD) WITH PROPOFOL;  Surgeon: Milus Banister, MD;  Location: [facility];  Service: Endoscopy;  Laterality: N/A;  . LOOP RECORDER INSERTION N/A 01/01/2017   Procedure: Loop Recorder Insertion;  Surgeon: Thompson Grayer, MD;  Location: [facility];  Service: Cardiovascular;  Laterality: N/A;  . RIGHT HEART CATHETERIZATION Right 06/06/2014   Procedure: RIGHT HEART CATH;  Surgeon: Jolaine Artist, MD;  Location: [facility];  Service: Cardiovascular;  Laterality: Right;  . UVULOPALATOPHARYNGOPLASTY  1999    Family History  Problem Relation Age of Onset  . Heart disease Mother   . Heart attack Mother   . Hypertension Mother   . Kidney failure Mother   . Liver disease Mother        stage 4 liver disease  . Prostate cancer Father   . Cancer Father        lung  . Hypertension Brother   . Gout Brother   . Alcoholism Maternal Grandfather   . Liver disease Maternal Grandfather   . Migraines Sister   . Hypertension Brother   . Colon cancer Neg Hx     Allergies  Allergen Reactions  . Aspirin Other (See Comments)    hypertension  . Nitroglycerin     Headache    Current Outpatient Medications on File Prior to Visit  Medication Sig Dispense Refill  . Albuterol Sulfate (PROAIR RESPICLICK) 211  (90 Base) MCG/ACT AEPB Inhale 2 puffs into the lungs every 6 (six) hours as needed (SOB or wheezing). 3 each 1  . allopurinol (ZYLOPRIM) 100 MG tablet Take 2 tablets (200 mg total) by mouth daily. 180 tablet 1  . cholestyramine (QUESTRAN) 4 g packet Take 1 packet (4 g total) by mouth 2 (two) times daily. 60 each 12  . colchicine 0.6 MG tablet Take 1 tablet (0.6 mg total) by mouth 2 (two) times daily as needed. 60 tablet 1  . diphenoxylate-atropine (  LOMOTIL) 2.5-0.025 MG tablet Take 1 tablet by mouth 3 (three) times daily. 60 tablet 0  . fluticasone (FLONASE) 50 MCG/ACT nasal spray Place 2 sprays into both nostrils daily. 16 g 1  . fluticasone-salmeterol (ADVAIR HFA) 230-21 MCG/ACT inhaler Inhale 2 puffs into the lungs every morning. 48 g 1  . furosemide (LASIX) 40 MG tablet Take 1 tablet (40 mg total) by mouth 3 (three) times a week. Every Mon, Wed and Friday 180 tablet 2  . hyoscyamine (LEVSIN SL) 0.125 MG SL tablet Place 1 tablet (0.125 mg total) under the tongue every 6 (six) hours as needed. 90 tablet 3  . ipratropium (ATROVENT HFA) 17 MCG/ACT inhaler Inhale 2 puffs into the lungs every 6 (six) hours as needed for wheezing. 1 Inhaler 12  . levETIRAcetam (KEPPRA) 500 MG tablet Take 1 tablet every night 90 tablet 1  . loperamide (IMODIUM) 2 MG capsule Take 2 mg by mouth as needed for diarrhea or loose stools.    Marland Kitchen losartan (COZAAR) 50 MG tablet TAKE 1 TABLET DAILY 90 tablet 2  . Macitentan (OPSUMIT) 10 MG TABS Take 1 tablet (10 mg total) by mouth daily. 30 tablet 11  . omeprazole (PRILOSEC) 40 MG capsule TAKE 1 CAPSULE DAILY 90 capsule 2  . potassium chloride (K-DUR) 10 MEQ tablet Take 20 mEq by mouth daily.     . Selexipag (UPTRAVI) 1000 MCG TABS Take 1,000 mcg by mouth 2 (two) times daily. 60 tablet 11  . spironolactone (ALDACTONE) 50 MG tablet TAKE 1 TABLET DAILY 90 tablet 1  . tadalafil, PAH, (ADCIRCA) 20 MG tablet TAKE 2 TABLETS (40 MG TOTAL) DAILY 60 tablet 12  . tiZANidine (ZANAFLEX) 2 MG  tablet Take 0.5-2 tablets (1-4 mg total) by mouth every 6 (six) hours as needed for muscle spasms. 30 tablet 0   No current facility-administered medications on file prior to visit.     BP 113/67   Pulse 70   Temp 97.7 F (36.5 C) (Oral)   Resp 16   Ht 6' (1.829 m)   Wt 191 lb (86.6 kg)   SpO2 100%   BMI 25.90 kg/m       Objective:   Physical Exam  General  Mental Status - Alert. General Appearance - Well groomed. Not in acute distress.  Skin Rashes- No Rashes.  HEENT Head- Normal. Ear Auditory Canal - Left- Normal. Right - Normal.Tympanic Membrane- Left- Normal. Right- Normal. Eye Sclera/Conjunctiva- Left- Normal. Right- Normal. Nose & Sinuses Nasal Mucosa- Left-  Boggy and Congested. Right-  Boggy and  Congested.Bilateral  No maxillary and no  frontal sinus pressure. Mouth & Throat Lips: Upper Lip- Normal: no dryness, cracking, pallor, cyanosis, or vesicular eruption. Lower Lip-Normal: no dryness, cracking, pallor, cyanosis or vesicular eruption. Buccal Mucosa- Bilateral- No Aphthous ulcers. Oropharynx- No Discharge or Erythema. Tonsils: Characteristics- Bilateral- No Erythema or Congestion. Size/Enlargement- Bilateral- No enlargement. Discharge- bilateral-None.  Neck Neck- Supple. No Masses.   Chest and Lung Exam Auscultation: Breath Sounds:-even and unlabored. But faint rough breath sounds bilateral.  Cardiovascular Auscultation:Rythm- Regular, rate and rhythm. Murmurs & Other Heart Sounds:Ausculatation of the heart reveal- No Murmurs.  Lymphatic Head & Neck General Head & Neck Lymphatics: Bilateral: Description- No Localized lymphadenopathy.  Lower ext- no pedal edema. negativ homans sign,       Assessment & Plan:  You do appear to have recent episode of bronchitis with some shortness of breath/dyspnea.  Based you medical history would like to rule out pneumonia and see if  you have any component of CHF present as you do have a history of that.  In  addition with history of smoking may have some reactive airways/COPD component.  We will get a chest x-ray today along with CBC, CMP, BNP and troponin.  BNP and troponin are stat.  No obvious cardiac symptoms presently but with the shortness of breath would like to know troponin level is been normal and not having any indication of demand ischemia.  For loose stools x2 weeks, I did put gastrointestinal panel ordered.  Please turn those in some time for the weekend so we can get those results back sometime early next week.  Continue medication your PCP gave her loose stools.  I am prescribing doxycycline antibiotic.  Hycodan cough syrup and I want you to continue your 2 inhalers.  If work-up does not indicate acute CHF and dyspnea is persisting then might give you a short course of tapered prednisone.  Your transient dizziness today I think was likely from standing up very quickly.  We will see if you have indicators of dehydration or anemia on lab work.  You had good neurologic exam today.  Follow-up in 5 days or as needed.   40 minutes spent with pt. 50% of time  counseled on work up approach to recent symptoms in light of his chronic medical problems.   Mackie Pai, PA-C

## 2018-09-23 ENCOUNTER — Telehealth: Payer: Self-pay

## 2018-09-23 ENCOUNTER — Other Ambulatory Visit: Payer: Self-pay | Admitting: Family Medicine

## 2018-09-23 NOTE — Telephone Encounter (Signed)
Pt. Phoned to follow-up on chest xray and lab results. Author relayed [employer], and reminded him about starting doxycycline and keeping clinic notified of any worsening symptoms, including worsening leg edema. Follow-up appointment with Percell Miller confirmed for 11/25 at 0820AM. Pt. Asked if he should follow-up with cardiology as well in the meantime, and author stated she would ask Percell Miller and PCP for recommendations.

## 2018-09-23 NOTE — Telephone Encounter (Signed)
Copied from [place] [phone]. Topic: Quick Communication - See Telephone Encounter >> Sep 23, 2018  9:05 AM Conception Chancy, NT wrote: CRM for notification. See Telephone encounter for: 09/23/18.  Shyenne from [employer] is calling and states the patient is requesting 90 day supply refills on the following medications.  colchicine 0.6 MG tablet Symax 0.375 MG ( I do not see this on the patient chart, please advise)  [place], [place] [address] [place] [address] Phone: [phone] Fax: [phone]

## 2018-09-23 NOTE — Telephone Encounter (Signed)
Patient called and advised of the requests from [facility]. I advised the patient that Symax 0.375 mg is no longer on his current medication profile, but Hyoscyamine (Levsin SL) is on there and both medications are the same, except Symax is a 12 hour pill. He says he has the bottle for Levsin that was picked up from [facility] on 08/09/18 #90 3 refills and that's what he takes. He says he wants to start receiving from [facility], because it will be cheaper.

## 2018-09-23 NOTE — Telephone Encounter (Signed)
So reviewed CXR and labs, his labs actually look OK for him and cxr suggests bonchitis so he is on a good course of treatment. I agree the dizziness is likely dehydration and coughing and diarrhea make that happen. Hydrate better and change positions more slowly and if he continues to have episodes of dizziness he should contact cardiology

## 2018-09-24 ENCOUNTER — Other Ambulatory Visit (HOSPITAL_COMMUNITY): Payer: Self-pay | Admitting: Cardiology

## 2018-09-24 MED ORDER — SELEXIPAG 1000 MCG PO TABS: 1000.0000 ug | ORAL_TABLET | Freq: Two times a day (BID) | ORAL | 11 refills | Status: DC

## 2018-09-24 MED ORDER — COLCHICINE 0.6 MG PO TABS: 0.6000 mg | ORAL_TABLET | Freq: Two times a day (BID) | ORAL | 1 refills | Status: DC | PRN

## 2018-09-24 MED ORDER — HYOSCYAMINE SULFATE 0.125 MG SL SUBL: 0.1250 mg | SUBLINGUAL_TABLET | Freq: Four times a day (QID) | SUBLINGUAL | 3 refills | Status: DC | PRN

## 2018-09-27 ENCOUNTER — Encounter: Payer: Self-pay | Admitting: Medical

## 2018-09-27 ENCOUNTER — Ambulatory Visit (HOSPITAL_BASED_OUTPATIENT_CLINIC_OR_DEPARTMENT_OTHER)
Admission: RE | Admit: 2018-09-27 | Discharge: 2018-09-27 | Disposition: A | Discharge status: Home / Self Care | Payer: Medicare Other | Source: Ambulatory Visit | Attending: Medical | Admitting: Medical

## 2018-09-27 ENCOUNTER — Ambulatory Visit (INDEPENDENT_AMBULATORY_CARE_PROVIDER_SITE_OTHER): Payer: Medicare Other | Admitting: Medical

## 2018-09-27 VITALS — BP 101/58 | HR 92 | Temp 97.7°F | Resp 16 | Ht 72.0 in | Wt 187.0 lb

## 2018-09-27 DIAGNOSIS — J40 Bronchitis, not specified as acute or chronic: Secondary | ICD-10-CM | POA: Diagnosis not present

## 2018-09-27 DIAGNOSIS — I50810 Right heart failure, unspecified: Secondary | ICD-10-CM | POA: Insufficient documentation

## 2018-09-27 DIAGNOSIS — R05 Cough: Secondary | ICD-10-CM

## 2018-09-27 DIAGNOSIS — J984 Other disorders of lung: Secondary | ICD-10-CM | POA: Diagnosis not present

## 2018-09-27 DIAGNOSIS — R197 Diarrhea, unspecified: Secondary | ICD-10-CM | POA: Diagnosis not present

## 2018-09-27 DIAGNOSIS — R059 Cough, unspecified: Secondary | ICD-10-CM

## 2018-09-27 LAB — COMPREHENSIVE METABOLIC PANEL
ALK PHOS: 206 U/L — AB (ref 39–117)
ALT: 14 U/L (ref 0–53)
AST: 37 U/L (ref 0–37)
Albumin: 3.6 g/dL (ref 3.5–5.2)
BILIRUBIN TOTAL: 1.4 mg/dL — AB (ref 0.2–1.2)
BUN: 21 mg/dL (ref 6–23)
CO2: 22 meq/L (ref 19–32)
Calcium: 8.7 mg/dL (ref 8.4–10.5)
Chloride: 105 mEq/L (ref 96–112)
Creatinine, Ser: 2.14 mg/dL — ABNORMAL HIGH (ref 0.40–1.50)
GFR: 39.64 mL/min — AB (ref >60.00)
GLUCOSE: 99 mg/dL (ref 70–99)
POTASSIUM: 4.4 meq/L (ref 3.5–5.1)
SODIUM: 134 meq/L — AB (ref 135–145)
TOTAL PROTEIN: 7.1 g/dL (ref 6.0–8.3)

## 2018-09-27 LAB — BRAIN NATRIURETIC PEPTIDE: PRO B NATRI PEPTIDE: 415 pg/mL — AB (ref 0.0–100.0)

## 2018-09-27 MED ORDER — COLCHICINE 0.6 MG PO TABS: 0.6000 mg | ORAL_TABLET | Freq: Two times a day (BID) | ORAL | 1 refills | Status: DC | PRN

## 2018-09-27 MED ORDER — FLUTICASONE PROPIONATE 50 MCG/ACT NA SUSP: 2.0000 | Freq: Every day | NASAL | 1 refills | Status: DC

## 2018-09-27 MED ORDER — POTASSIUM CHLORIDE ER 10 MEQ PO TBCR: 20.0000 meq | EXTENDED_RELEASE_TABLET | Freq: Every day | ORAL | 3 refills | Status: DC

## 2018-09-27 MED ORDER — ROSUVASTATIN CALCIUM 5 MG PO TABS: 5.0000 mg | ORAL_TABLET | Freq: Every day | ORAL | 3 refills | Status: DC

## 2018-09-27 MED ORDER — OMEPRAZOLE 40 MG PO CPDR: 40.0000 mg | DELAYED_RELEASE_CAPSULE | Freq: Every day | ORAL | 3 refills | Status: DC

## 2018-09-27 MED FILL — FLUTICASONE PROP 50 MCG SPR: 50 | 30 days supply | Qty: 16 | Fill #0

## 2018-09-27 MED FILL — POTASSIUM CL ER 10 MEQ TAB: 10 | 30 days supply | Qty: 60 | Fill #0

## 2018-09-27 NOTE — Patient Instructions (Addendum)
For your history of CHF and approaching Thanksgiving day holiday, I do want to repeat your metabolic panel and BNP today.  Assess the condition is stable.  Also will repeat chest x-ray.  You do have some improved bronchitis type symptoms.  Continue with doxycycline.  Continue with cough medicine given last week.  You do have more nasal congestion today and will prescribe Flonase nasal spray.  You have persisting 3-4 loose stools a day.  You turn in stool panel kit and will follow those results.  You can use Imodium over-the-counter and please make sure that you stay hydrated with [employer] water or sugar-free Gatorade.  With your history if signs symptoms worsen despite above measures then would recommend ED evaluation.  Have a good Thanksgiving.  Follow-up in 7 to 10 days or as needed.

## 2018-09-27 NOTE — Progress Notes (Signed)
Subjective:    Patient ID: Darren Mueller, male    DOB: [birth year], 67 y.o.   MRN: [id number]  HPI  Pt is in for follow up. He has hx of bronchitis most recently but known hx of chf as well. CXR did not show pneumonia. No effusion. BNP was mild elevated. Hx of alcohol abuse. Liver enzyme elevation and low platelets are chronic. Recent troponin was negative. Pt is on doxycyclne and wanted to reassure that pt had improvement.  Pt states he does feel better.  No fever, no chills and no sweats.  Some loose stools still and he turned in stool sample this morning.  Pt weight is about 4 pounds less than last visit.    Review of Systems  Constitutional: Negative for chills, fatigue and fever.       States feels more energetic.  HENT: Positive for congestion.        More nasal congested since last visit.   Respiratory: Positive for cough and shortness of breath. Negative for wheezing.        Less cough.   Sob near his baseline.  Cardiovascular: Negative for chest pain, palpitations and leg swelling.  Gastrointestinal: Positive for diarrhea. Negative for abdominal distention, abdominal pain, blood in stool, nausea and vomiting.       Loose stools continue about 3-4 times a day. Just turn in stool panel kit.  Musculoskeletal: Negative for back pain.  Neurological: Negative for dizziness, speech difficulty, weakness, numbness and headaches.  Psychiatric/Behavioral: Negative for behavioral problems, confusion, hallucinations and self-injury.   Past Medical History:  Diagnosis Date  . Alcohol dependence ([place]) 03/22/2011   In remission   . Alcohol dependence in remission ([place]) 03/22/2011   In remission   . Alcoholic cirrhosis of liver with ascites ([place]) 2014  . Dermatitis 06/10/2015  . Diarrhea 09/04/2013  . Elbow pain, left 03/17/2017  . GERD (gastroesophageal reflux disease)   . Gout   . Heart murmur   . Hx of colonic polyps   . Hypertension   . Hypertriglyceridemia 03/17/2017  .  Hypopotassemia   . Otalgia 11/28/2013  . Other chronic pulmonary heart diseases   . Preventative health care 06/29/2016  . Red eye 03/03/2016   right  . Renal insufficiency 07/18/2013  . Seizures ([place])   . Sleep apnea    does not wear CPAP  . Thrombocytopenia ([place]) 06/29/2016  . Unspecified hypothyroidism 01/25/2013  . Unspecified pleural effusion      Social History   Socioeconomic History  . Marital status: Divorced    Spouse name: Not on file  . Number of children: 3  . Years of education: Not on file  . Highest education level: Not on file  Occupational History  . Occupation: Retired    Comment: Microbiologist  . Financial resource strain: Not on file  . Food insecurity:    Worry: Not on file    Inability: Not on file  . Transportation needs:    Medical: Not on file    Non-medical: Not on file  Tobacco Use  . Smoking status: Former Smoker    Packs/day: 2.00    Years: 5.00    Pack years: 10.00    Types: Cigarettes    Last attempt to quit: 09/22/1979    Years since quitting: 39.0  . Smokeless tobacco: Never Used  Substance and Sexual Activity  . Alcohol use: Yes    Alcohol/week: 6.0 standard drinks    Types: 6 Shots  of liquor per week    Comment: social drinker  . Drug use: No  . Sexual activity: Yes    Birth control/protection: Spermicide  Lifestyle  . Physical activity:    Days per week: Not on file    Minutes per session: Not on file  . Stress: Not on file  Relationships  . Social connections:    Talks on phone: Not on file    Gets together: Not on file    Attends religious service: Not on file    Active member of club or organization: Not on file    Attends meetings of clubs or organizations: Not on file    Relationship status: Not on file  . Intimate partner violence:    Fear of current or ex partner: Not on file    Emotionally abused: Not on file    Physically abused: Not on file    Forced sexual activity: Not on file  Other Topics  Concern  . Not on file  Social History Narrative   0 caffeine drinks daily     Past Surgical History:  Procedure Laterality Date  . CARDIAC CATHETERIZATION N/A 12/21/2015   Procedure: Right Heart Cath;  Surgeon: Jolaine Artist, MD;  Location: [facility];  Service: Cardiovascular;  Laterality: N/A;  . CARDIAC CATHETERIZATION N/A 04/15/2016   Procedure: Right/Left Heart Cath and Coronary Angiography;  Surgeon: Jolaine Artist, MD;  Location: [facility];  Service: Cardiovascular;  Laterality: N/A;  . COLONOSCOPY N/A 12/08/2013   Procedure: COLONOSCOPY;  Surgeon: Milus Banister, MD;  Location: [facility];  Service: Endoscopy;  Laterality: N/A;  . ESOPHAGOGASTRODUODENOSCOPY (EGD) WITH PROPOFOL N/A 03/27/2016   Procedure: ESOPHAGOGASTRODUODENOSCOPY (EGD) WITH PROPOFOL;  Surgeon: Milus Banister, MD;  Location: [facility];  Service: Endoscopy;  Laterality: N/A;  . LOOP RECORDER INSERTION N/A 01/01/2017   Procedure: Loop Recorder Insertion;  Surgeon: Thompson Grayer, MD;  Location: [facility];  Service: Cardiovascular;  Laterality: N/A;  . RIGHT HEART CATHETERIZATION Right 06/06/2014   Procedure: RIGHT HEART CATH;  Surgeon: Jolaine Artist, MD;  Location: [facility];  Service: Cardiovascular;  Laterality: Right;  . UVULOPALATOPHARYNGOPLASTY  1999    Family History  Problem Relation Age of Onset  . Heart disease Mother   . Heart attack Mother   . Hypertension Mother   . Kidney failure Mother   . Liver disease Mother        stage 4 liver disease  . Prostate cancer Father   . Cancer Father        lung  . Hypertension Brother   . Gout Brother   . Alcoholism Maternal Grandfather   . Liver disease Maternal Grandfather   . Migraines Sister   . Hypertension Brother   . Colon cancer Neg Hx     Allergies  Allergen Reactions  . Aspirin Other (See Comments)    hypertension  . Nitroglycerin     Headache    Current Outpatient Medications on File Prior to Visit   Medication Sig Dispense Refill  . Albuterol Sulfate (PROAIR RESPICLICK) 211 (90 Base) MCG/ACT AEPB Inhale 2 puffs into the lungs every 6 (six) hours as needed (SOB or wheezing). 3 each 1  . allopurinol (ZYLOPRIM) 100 MG tablet Take 2 tablets (200 mg total) by mouth daily. 180 tablet 1  . cholestyramine (QUESTRAN) 4 g packet Take 1 packet (4 g total) by mouth 2 (two) times daily. 60 each 12  . colchicine  0.6 MG tablet Take 1 tablet (0.6 mg total) by mouth 2 (two) times daily as needed. 60 tablet 1  . diphenoxylate-atropine (LOMOTIL) 2.5-0.025 MG tablet Take 1 tablet by mouth 3 (three) times daily. 60 tablet 0  . doxycycline (VIBRA-TABS) 100 MG tablet Take 1 tablet (100 mg total) by mouth 2 (two) times daily. 20 tablet 0  . fluticasone (FLONASE) 50 MCG/ACT nasal spray Place 2 sprays into both nostrils daily. 16 g 1  . fluticasone-salmeterol (ADVAIR HFA) 230-21 MCG/ACT inhaler Inhale 2 puffs into the lungs every morning. 48 g 1  . furosemide (LASIX) 40 MG tablet Take 1 tablet (40 mg total) by mouth 3 (three) times a week. Every Mon, Wed and Friday 180 tablet 2  . HYDROcodone-homatropine (HYCODAN) 5-1.5 MG/5ML syrup Take 5 mLs by mouth every 6 (six) hours as needed. 100 mL 0  . hyoscyamine (LEVSIN SL) 0.125 MG SL tablet Place 1 tablet (0.125 mg total) under the tongue every 6 (six) hours as needed. 90 tablet 3  . ipratropium (ATROVENT HFA) 17 MCG/ACT inhaler Inhale 2 puffs into the lungs every 6 (six) hours as needed for wheezing. 1 Inhaler 12  . levETIRAcetam (KEPPRA) 500 MG tablet Take 1 tablet every night 90 tablet 1  . loperamide (IMODIUM) 2 MG capsule Take 2 mg by mouth as needed for diarrhea or loose stools.    Marland Kitchen losartan (COZAAR) 50 MG tablet TAKE 1 TABLET DAILY 90 tablet 2  . Macitentan (OPSUMIT) 10 MG TABS Take 1 tablet (10 mg total) by mouth daily. 30 tablet 11  . Selexipag (UPTRAVI) 1000 MCG TABS Take 1,000 mcg by mouth 2 (two) times daily. 60 tablet 11  . spironolactone (ALDACTONE) 50 MG  tablet TAKE 1 TABLET DAILY 90 tablet 1  . tadalafil, PAH, (ADCIRCA) 20 MG tablet TAKE 2 TABLETS (40 MG TOTAL) DAILY 60 tablet 12  . tiZANidine (ZANAFLEX) 2 MG tablet Take 0.5-2 tablets (1-4 mg total) by mouth every 6 (six) hours as needed for muscle spasms. 30 tablet 0   No current facility-administered medications on file prior to visit.     BP (!) 101/58 (BP Location: Right Arm, Patient Position: Sitting, Cuff Size: Small)   Pulse 92   Temp 97.7 F (36.5 C) (Oral)   Resp 16   Ht 6' (1.829 m)   Wt 187 lb (84.8 kg)   SpO2 98%   BMI 25.36 kg/m       Objective:   Physical Exam  General Mental Status- Alert. General Appearance- Not in acute distress.   Skin General: Color- Normal Color. Moisture- Normal Moisture.  Neck Carotid Arteries- Normal color. Moisture- Normal Moisture. No carotid bruits. No JVD.  Chest and Lung Exam Auscultation: Breath Sounds:-Normal.  Cardiovascular Auscultation:Rythm- Regular. Murmurs & Other Heart Sounds:Auscultation of the heart reveals- No Murmurs.  Abdomen Inspection:-Inspeection Normal. Palpation/Percussion:Note:No mass. Palpation and Percussion of the abdomen reveal- Non Tender, Non Distended + BS, no rebound or guarding.    Neurologic Cranial Nerve exam:- CN III-XII intact(No nystagmus), symmetric smile. Strength:- 5/5 equal and symmetric strength both upper and lower extremities.  Lower ext- no pedal edema. Negative homans sign.   HEENT Head- Normal. Ear Auditory Canal - Left- Normal. Right - Normal.Tympanic Membrane- Left- Normal. Right- Normal. Eye Sclera/Conjunctiva- Left- Normal. Right- Normal. Nose & Sinuses Nasal Mucosa- Left-  Boggy and Congested. Right-  Boggy and  Congested.Bilateral no maxillary and  No frontal sinus pressure. Mouth & Throat Lips: Upper Lip- Normal: no dryness, cracking, pallor, cyanosis, or  vesicular eruption. Lower Lip-Normal: no dryness, cracking, pallor, cyanosis or vesicular eruption. Buccal  Mucosa- Bilateral- No Aphthous ulcers. Oropharynx- No Discharge or Erythema. Tonsils: Characteristics- Bilateral- No Erythema or Congestion. Size/Enlargement- Bilateral- No enlargement. Discharge- bilateral-None.           Assessment & Plan:  For your history of CHF and approaching Thanksgiving day holiday, I do want to repeat your metabolic panel and BNP today.  Assess the condition is stable.  Also will repeat chest x-ray.  You do have some improved bronchitis type symptoms.  Continue with doxycycline.  Continue with cough medicine given last week.  You do have more nasal congestion today and will prescribe Flonase nasal spray.  You have persisting 3-4 loose stools a day.  You turn in stool panel kit and will follow those results.  You can use Imodium over-the-counter and please make sure that you stay hydrated with [employer] water or sugar-free Gatorade.  With your history if signs symptoms worsen despite above measures then would recommend ED evaluation.  Have a good Thanksgiving.  Follow-up in 7 to 10 days or as needed.  Mackie Pai, PA-C

## 2018-09-27 NOTE — Addendum Note (Signed)
Addended by: Magdalene Molly A on: 09/27/2018 02:43 PM   Modules accepted: Orders

## 2018-09-28 ENCOUNTER — Telehealth: Payer: Self-pay | Admitting: Family Medicine

## 2018-09-28 LAB — GASTROINTESTINAL PATHOGEN PANEL PCR
C. difficile Tox A/B, PCR: NOT DETECTED
CAMPYLOBACTER, PCR: NOT DETECTED
Cryptosporidium, PCR: NOT DETECTED
E COLI (STEC) STX1/STX2, PCR: NOT DETECTED
E coli (ETEC) LT/ST PCR: NOT DETECTED
E coli 0157, PCR: NOT DETECTED
Giardia lamblia, PCR: NOT DETECTED
NOROVIRUS, PCR: NOT DETECTED
ROTAVIRUS, PCR: NOT DETECTED
SALMONELLA, PCR: NOT DETECTED
SHIGELLA, PCR: NOT DETECTED

## 2018-09-28 NOTE — Telephone Encounter (Signed)
See result note.  

## 2018-09-28 NOTE — Telephone Encounter (Signed)
Copied from [place]. Topic: Quick Communication - Lab Results (Clinic Use ONLY) >> Sep 28, 2018  9:24 AM [name] Dyer, [place] wrote: Called patient to inform them of 09/17/18 lab results. When patient returns call, triage nurse may disclose results. >> Sep 28, 2018  2:12 PM Marin Olp L wrote: No [facility] RN available for results

## 2018-09-28 NOTE — Telephone Encounter (Signed)
Author phoned pt. To relay Dr. Frederik Pear message and relay most recent labwork since visit with Percell Miller, no answer. Author left generic VM asking for return call if any questions.

## 2018-10-04 ENCOUNTER — Encounter: Payer: Self-pay | Admitting: Medical

## 2018-10-04 ENCOUNTER — Ambulatory Visit (INDEPENDENT_AMBULATORY_CARE_PROVIDER_SITE_OTHER): Payer: Medicare Other | Admitting: Medical

## 2018-10-04 VITALS — BP 119/68 | HR 83 | Temp 97.6°F | Resp 16 | Ht 72.0 in | Wt 185.9 lb

## 2018-10-04 DIAGNOSIS — R197 Diarrhea, unspecified: Secondary | ICD-10-CM | POA: Diagnosis not present

## 2018-10-04 DIAGNOSIS — J40 Bronchitis, not specified as acute or chronic: Secondary | ICD-10-CM | POA: Diagnosis not present

## 2018-10-04 DIAGNOSIS — K7030 Alcoholic cirrhosis of liver without ascites: Secondary | ICD-10-CM | POA: Diagnosis not present

## 2018-10-04 DIAGNOSIS — D6959 Other secondary thrombocytopenia: Secondary | ICD-10-CM | POA: Diagnosis not present

## 2018-10-04 DIAGNOSIS — G4733 Obstructive sleep apnea (adult) (pediatric): Secondary | ICD-10-CM | POA: Diagnosis not present

## 2018-10-04 DIAGNOSIS — N183 Chronic kidney disease, stage 3 (moderate): Secondary | ICD-10-CM | POA: Diagnosis not present

## 2018-10-04 DIAGNOSIS — I2721 Secondary pulmonary arterial hypertension: Secondary | ICD-10-CM | POA: Diagnosis not present

## 2018-10-04 DIAGNOSIS — I129 Hypertensive chronic kidney disease with stage 1 through stage 4 chronic kidney disease, or unspecified chronic kidney disease: Secondary | ICD-10-CM | POA: Diagnosis not present

## 2018-10-04 DIAGNOSIS — I5033 Acute on chronic diastolic (congestive) heart failure: Secondary | ICD-10-CM | POA: Diagnosis not present

## 2018-10-04 DIAGNOSIS — I50810 Right heart failure, unspecified: Secondary | ICD-10-CM

## 2018-10-04 DIAGNOSIS — R809 Proteinuria, unspecified: Secondary | ICD-10-CM | POA: Diagnosis not present

## 2018-10-04 DIAGNOSIS — N179 Acute kidney failure, unspecified: Secondary | ICD-10-CM | POA: Diagnosis not present

## 2018-10-04 DIAGNOSIS — D631 Anemia in chronic kidney disease: Secondary | ICD-10-CM | POA: Diagnosis not present

## 2018-10-04 MED ORDER — AZELASTINE HCL 0.1 % NA SOLN: 2.0000 | Freq: Two times a day (BID) | NASAL | 1 refills | Status: DC

## 2018-10-04 MED FILL — AZELASTINE HCL 137 MCG/SPRA: 137 | 25 days supply | Qty: 30 | Fill #0

## 2018-10-04 NOTE — Progress Notes (Signed)
Subjective:    Patient ID: Darren Mueller, male    DOB: September 13, 1951, 67 y.o.   MRN: [id number]  HPI  Pt in for follow up.  He states last 2 days of bm were hard and formed. He was able to eat thanksgiving despite having some symptoms  around that time. But describes loose stools were improving/tapering. Then all sudden normalized 2 days ago.  See last note. He was having loose stools. GI panel did come back negative over weekend.  Pt former bronchitis signs and symptoms cleared. No chf type sign and symptoms reported.  Last cxr showed no pneumonia and flare of chf. Only mild cardiomegaly.   Review of Systems  Constitutional: Negative for chills, fatigue and fever.  HENT: Positive for congestion.        Has nasal congestion now. Ear pressure.   Respiratory: Negative for cough, choking, chest tightness, shortness of breath and wheezing.   Cardiovascular: Negative for chest pain and palpitations.  Gastrointestinal: Negative for abdominal pain.  Musculoskeletal: Negative for arthralgias, back pain, joint swelling and neck pain.  Skin: Negative for rash.  Neurological: Negative for dizziness, syncope, speech difficulty, weakness, numbness and headaches.  Hematological: Negative for adenopathy. Does not bruise/bleed easily.  Psychiatric/Behavioral: Negative for behavioral problems and confusion.    Past Medical History:  Diagnosis Date  . Alcohol dependence ([place]) 03/22/2011   In remission   . Alcohol dependence in remission ([place]) 03/22/2011   In remission   . Alcoholic cirrhosis of liver with ascites ([place]) 2014  . Dermatitis 06/10/2015  . Diarrhea 09/04/2013  . Elbow pain, left 03/17/2017  . GERD (gastroesophageal reflux disease)   . Gout   . Heart murmur   . Hx of colonic polyps   . Hypertension   . Hypertriglyceridemia 03/17/2017  . Hypopotassemia   . Otalgia 11/28/2013  . Other chronic pulmonary heart diseases   . Preventative health care 06/29/2016  . Red eye 03/03/2016   right  . Renal insufficiency 07/18/2013  . Seizures ([place])   . Sleep apnea    does not wear CPAP  . Thrombocytopenia ([place]) 06/29/2016  . Unspecified hypothyroidism 01/25/2013  . Unspecified pleural effusion      Social History   Socioeconomic History  . Marital status: Divorced    Spouse name: Not on file  . Number of children: 3  . Years of education: Not on file  . Highest education level: Not on file  Occupational History  . Occupation: Retired    Comment: Microbiologist  . Financial resource strain: Not on file  . Food insecurity:    Worry: Not on file    Inability: Not on file  . Transportation needs:    Medical: Not on file    Non-medical: Not on file  Tobacco Use  . Smoking status: Former Smoker    Packs/day: 2.00    Years: 5.00    Pack years: 10.00    Types: Cigarettes    Last attempt to quit: 09/22/1979    Years since quitting: 39.0  . Smokeless tobacco: Never Used  Substance and Sexual Activity  . Alcohol use: Yes    Alcohol/week: 6.0 standard drinks    Types: 6 Shots of liquor per week    Comment: social drinker  . Drug use: No  . Sexual activity: Yes    Birth control/protection: Spermicide  Lifestyle  . Physical activity:    Days per week: Not on file    Minutes per session:  Not on file  . Stress: Not on file  Relationships  . Social connections:    Talks on phone: Not on file    Gets together: Not on file    Attends religious service: Not on file    Active member of club or organization: Not on file    Attends meetings of clubs or organizations: Not on file    Relationship status: Not on file  . Intimate partner violence:    Fear of current or ex partner: Not on file    Emotionally abused: Not on file    Physically abused: Not on file    Forced sexual activity: Not on file  Other Topics Concern  . Not on file  Social History Narrative   0 caffeine drinks daily     Past Surgical History:  Procedure Laterality Date  . CARDIAC  CATHETERIZATION N/A 12/21/2015   Procedure: Right Heart Cath;  Surgeon: Jolaine Artist, MD;  Location: [facility];  Service: Cardiovascular;  Laterality: N/A;  . CARDIAC CATHETERIZATION N/A 04/15/2016   Procedure: Right/Left Heart Cath and Coronary Angiography;  Surgeon: Jolaine Artist, MD;  Location: [facility];  Service: Cardiovascular;  Laterality: N/A;  . COLONOSCOPY N/A 12/08/2013   Procedure: COLONOSCOPY;  Surgeon: Milus Banister, MD;  [facility]: [facility];  Service: Endoscopy;  Laterality: N/A;  . ESOPHAGOGASTRODUODENOSCOPY (EGD) WITH PROPOFOL N/A 03/27/2016   Procedure: ESOPHAGOGASTRODUODENOSCOPY (EGD) WITH PROPOFOL;  Surgeon: Milus Banister, MD;  Location: [facility];  Service: Endoscopy;  Laterality: N/A;  . LOOP RECORDER INSERTION N/A 01/01/2017   Procedure: Loop Recorder Insertion;  Surgeon: Thompson Grayer, MD;  Location: [facility];  Service: Cardiovascular;  Laterality: N/A;  . RIGHT HEART CATHETERIZATION Right 06/06/2014   Procedure: RIGHT HEART CATH;  Surgeon: Jolaine Artist, MD;  Location: [facility];  Service: Cardiovascular;  Laterality: Right;  . UVULOPALATOPHARYNGOPLASTY  1999    Family History  Problem Relation Age of Onset  . Heart disease Mother   . Heart attack Mother   . Hypertension Mother   . Kidney failure Mother   . Liver disease Mother        stage 4 liver disease  . Prostate cancer Father   . Cancer Father        lung  . Hypertension Brother   . Gout Brother   . Alcoholism Maternal Grandfather   . Liver disease Maternal Grandfather   . Migraines Sister   . Hypertension Brother   . Colon cancer Neg Hx     Allergies  Allergen Reactions  . Aspirin Other (See Comments)    hypertension  . Nitroglycerin     Headache    Current Outpatient Medications on File Prior to Visit  Medication Sig Dispense Refill  . Albuterol Sulfate (PROAIR RESPICLICK) 614 (90 Base) MCG/ACT AEPB Inhale 2 puffs into the lungs every 6 (six)  hours as needed (SOB or wheezing). 3 each 1  . allopurinol (ZYLOPRIM) 100 MG tablet Take 2 tablets (200 mg total) by mouth daily. 180 tablet 1  . cholestyramine (QUESTRAN) 4 g packet Take 1 packet (4 g total) by mouth 2 (two) times daily. 60 each 12  . colchicine 0.6 MG tablet Take 1 tablet (0.6 mg total) by mouth 2 (two) times daily as needed. 90 tablet 1  . diphenoxylate-atropine (LOMOTIL) 2.5-0.025 MG tablet Take 1 tablet by mouth 3 (three) times daily. 60 tablet 0  . doxycycline (VIBRA-TABS) 100 MG tablet Take  1 tablet (100 mg total) by mouth 2 (two) times daily. 20 tablet 0  . fluticasone (FLONASE) 50 MCG/ACT nasal spray Place 2 sprays into both nostrils daily. 16 g 1  . fluticasone-salmeterol (ADVAIR HFA) 230-21 MCG/ACT inhaler Inhale 2 puffs into the lungs every morning. 48 g 1  . furosemide (LASIX) 40 MG tablet Take 1 tablet (40 mg total) by mouth 3 (three) times a week. Every Mon, Wed and Friday 180 tablet 2  . HYDROcodone-homatropine (HYCODAN) 5-1.5 MG/5ML syrup Take 5 mLs by mouth every 6 (six) hours as needed. 100 mL 0  . hyoscyamine (LEVSIN SL) 0.125 MG SL tablet Place 1 tablet (0.125 mg total) under the tongue every 6 (six) hours as needed. 90 tablet 3  . ipratropium (ATROVENT HFA) 17 MCG/ACT inhaler Inhale 2 puffs into the lungs every 6 (six) hours as needed for wheezing. 1 Inhaler 12  . levETIRAcetam (KEPPRA) 500 MG tablet Take 1 tablet every night 90 tablet 1  . loperamide (IMODIUM) 2 MG capsule Take 2 mg by mouth as needed for diarrhea or loose stools.    Marland Kitchen losartan (COZAAR) 50 MG tablet TAKE 1 TABLET DAILY 90 tablet 2  . Macitentan (OPSUMIT) 10 MG TABS Take 1 tablet (10 mg total) by mouth daily. 30 tablet 11  . omeprazole (PRILOSEC) 40 MG capsule Take 1 capsule (40 mg total) by mouth daily. 90 capsule 3  . potassium chloride (K-DUR) 10 MEQ tablet Take 2 tablets (20 mEq total) by mouth daily. 60 tablet 3  . rosuvastatin (CRESTOR) 5 MG tablet Take 1 tablet (5 mg total) by mouth  daily. 90 tablet 3  . Selexipag (UPTRAVI) 1000 MCG TABS Take 1,000 mcg by mouth 2 (two) times daily. 60 tablet 11  . spironolactone (ALDACTONE) 50 MG tablet TAKE 1 TABLET DAILY 90 tablet 1  . tadalafil, PAH, (ADCIRCA) 20 MG tablet TAKE 2 TABLETS (40 MG TOTAL) DAILY 60 tablet 12  . tiZANidine (ZANAFLEX) 2 MG tablet Take 0.5-2 tablets (1-4 mg total) by mouth every 6 (six) hours as needed for muscle spasms. 30 tablet 0   No current facility-administered medications on file prior to visit.     BP 119/68   Pulse 83   Temp 97.6 F (36.4 C) (Oral)   Resp 16   Ht 6' (1.829 m)   Wt 185 lb 14.4 oz (84.3 kg)   SpO2 98%   BMI 25.21 kg/m       Objective:   Physical Exam  General  Mental Status - Alert. General Appearance - Well groomed. Not in acute distress.  Skin Rashes- No Rashes.  HEENT Head- Normal. Ear Auditory Canal - Left- Normal. Right - Normal.Tympanic Membrane- Left- Normal. Right- Normal. Eye Sclera/Conjunctiva- Left- Normal. Right- Normal. Nose & Sinuses Nasal Mucosa- Left-  Boggy and Congested. Right-  Boggy and  Congested.Bilateral maxillary and frontal sinus pressure. Mouth & Throat Lips: Upper Lip- Normal: no dryness, cracking, pallor, cyanosis, or vesicular eruption. Lower Lip-Normal: no dryness, cracking, pallor, cyanosis or vesicular eruption. Buccal Mucosa- Bilateral- No Aphthous ulcers. Oropharynx- No Discharge or Erythema. Tonsils: Characteristics- Bilateral- No Erythema or Congestion. Size/Enlargement- Bilateral- No enlargement. Discharge- bilateral-None.  Neck Neck- Supple. No Masses.   Chest and Lung Exam Auscultation: Breath Sounds:-Clear even and unlabored.  Cardiovascular Auscultation:Rythm- Regular, rate and rhythm. Murmurs & Other Heart Sounds:Ausculatation of the heart reveal- No Murmurs.  Lymphatic Head & Neck General Head & Neck Lymphatics: Bilateral: Description- No Localized lymphadenopathy.  Lower ext- no pedal edema.  Assessment & Plan:  Your diarrhea has resolved and stool panel studies were negative. If loose stools return let us know. May have ibs type symptoms and may give med if diarrhea becomes chronic/recurrent.  Your bronchitis signs and symptoms appear resolved.  CHF appears stable.  For nasal congestion. Adding astelin nasal spray to flonase.  Follow up as regularly scheduled with pcp or as needed    Mackie Pai, PA-C

## 2018-10-04 NOTE — Patient Instructions (Addendum)
Your diarrhea has resolved and stool panel studies were negative. If loose stools return let us know. May have ibs type symptoms and may give med if diarrhea becomes chronic/recurrent.  Your bronchitis signs and symptoms appear resolved.  CHF appears stable.  For nasal congestion. Adding astelin nasal spray to flonase.  Follow up as regularly scheduled with pcp or as needed

## 2018-10-25 ENCOUNTER — Ambulatory Visit (INDEPENDENT_AMBULATORY_CARE_PROVIDER_SITE_OTHER): Payer: Medicare Other

## 2018-10-25 DIAGNOSIS — R55 Syncope and collapse: Secondary | ICD-10-CM

## 2018-10-25 LAB — CUP PACEART REMOTE DEVICE CHECK
Date Time Interrogation Session: 20191222033942
MDC IDC PG IMPLANT DT: 20180301

## 2018-10-25 NOTE — Progress Notes (Signed)
[facility] Summary Report / Loop Recorder

## 2018-11-07 LAB — CUP PACEART REMOTE DEVICE CHECK
Date Time Interrogation Session: 20191119034026
Implantable Pulse Generator Implant Date: 20180301

## 2018-11-09 ENCOUNTER — Encounter: Payer: Self-pay | Admitting: Family Medicine

## 2018-11-09 ENCOUNTER — Ambulatory Visit (INDEPENDENT_AMBULATORY_CARE_PROVIDER_SITE_OTHER): Payer: Medicare Other | Admitting: Family Medicine

## 2018-11-09 VITALS — BP 120/72 | HR 91 | Temp 97.8°F | Resp 18 | Ht 72.0 in | Wt 191.8 lb

## 2018-11-09 DIAGNOSIS — R197 Diarrhea, unspecified: Secondary | ICD-10-CM

## 2018-11-09 DIAGNOSIS — R739 Hyperglycemia, unspecified: Secondary | ICD-10-CM | POA: Diagnosis not present

## 2018-11-09 DIAGNOSIS — M1A9XX0 Chronic gout, unspecified, without tophus (tophi): Secondary | ICD-10-CM | POA: Diagnosis not present

## 2018-11-09 DIAGNOSIS — I10 Essential (primary) hypertension: Secondary | ICD-10-CM | POA: Diagnosis not present

## 2018-11-09 DIAGNOSIS — E039 Hypothyroidism, unspecified: Secondary | ICD-10-CM

## 2018-11-09 DIAGNOSIS — R109 Unspecified abdominal pain: Secondary | ICD-10-CM | POA: Diagnosis not present

## 2018-11-09 DIAGNOSIS — M25511 Pain in right shoulder: Secondary | ICD-10-CM | POA: Diagnosis not present

## 2018-11-09 DIAGNOSIS — G40909 Epilepsy, unspecified, not intractable, without status epilepticus: Secondary | ICD-10-CM

## 2018-11-09 DIAGNOSIS — N183 Chronic kidney disease, stage 3 unspecified: Secondary | ICD-10-CM

## 2018-11-09 LAB — COMPREHENSIVE METABOLIC PANEL
ALBUMIN: 3.7 g/dL (ref 3.5–5.2)
ALK PHOS: 264 U/L — AB (ref 39–117)
ALT: 23 U/L (ref 0–53)
AST: 66 U/L — ABNORMAL HIGH (ref 0–37)
BUN: 33 mg/dL — ABNORMAL HIGH (ref 6–23)
CALCIUM: 8.6 mg/dL (ref 8.4–10.5)
CO2: 25 mEq/L (ref 19–32)
Chloride: 104 mEq/L (ref 96–112)
Creatinine, Ser: 1.87 mg/dL — ABNORMAL HIGH (ref 0.40–1.50)
GFR: 46.3 mL/min — ABNORMAL LOW (ref >60.00)
Glucose, Bld: 115 mg/dL — ABNORMAL HIGH (ref 70–99)
Potassium: 4.7 mEq/L (ref 3.5–5.1)
Sodium: 135 mEq/L (ref 135–145)
Total Bilirubin: 1.6 mg/dL — ABNORMAL HIGH (ref 0.2–1.2)
Total Protein: 6.9 g/dL (ref 6.0–8.3)

## 2018-11-09 LAB — CBC WITH DIFFERENTIAL/PLATELET
Basophils Absolute: 0 10*3/uL (ref 0.0–0.1)
Basophils Relative: 0.4 % (ref 0.0–3.0)
Eosinophils Absolute: 0 10*3/uL (ref 0.0–0.7)
Eosinophils Relative: 1.5 % (ref 0.0–5.0)
HCT: 33.1 % — ABNORMAL LOW (ref 39.0–52.0)
HEMOGLOBIN: 11.2 g/dL — AB (ref 13.0–17.0)
LYMPHS PCT: 22.9 % (ref 12.0–46.0)
Lymphs Abs: 0.4 10*3/uL — ABNORMAL LOW (ref 0.7–4.0)
MCHC: 33.7 g/dL (ref 30.0–36.0)
MCV: 108.7 fl — ABNORMAL HIGH (ref 78.0–100.0)
Monocytes Absolute: 0.3 10*3/uL (ref 0.1–1.0)
Monocytes Relative: 16.5 % — ABNORMAL HIGH (ref 3.0–12.0)
Neutro Abs: 1 10*3/uL — ABNORMAL LOW (ref 1.4–7.7)
Neutrophils Relative %: 58.7 % (ref 43.0–77.0)
Platelets: 32 10*3/uL — CL (ref 150.0–400.0)
RBC: 3.05 Mil/uL — ABNORMAL LOW (ref 4.22–5.81)
RDW: 17 % — ABNORMAL HIGH (ref 11.5–15.5)
WBC: 1.7 10*3/uL — CL (ref 4.0–10.5)

## 2018-11-09 LAB — URIC ACID: Uric Acid, Serum: 7.8 mg/dL (ref 4.0–7.8)

## 2018-11-09 MED FILL — POTASSIUM CL ER 10 MEQ TAB: 10 | 30 days supply | Qty: 60 | Fill #1

## 2018-11-09 NOTE — Patient Instructions (Signed)
Gallbladder Eating Plan If you have a gallbladder condition, you may have trouble digesting fats. Eating a low-fat diet can help reduce your symptoms, and may be helpful before and after having surgery to remove your gallbladder (cholecystectomy). Your health care provider may recommend that you work with a diet and nutrition specialist (dietitian) to help you reduce the amount of fat in your diet. What are tips for following this plan? General guidelines  Limit your fat intake to less than 30% of your total daily calories. If you eat around 1,800 calories each day, this is less than 60 grams (g) of fat per day.  Fat is an important part of a healthy diet. Eating a low-fat diet can make it hard to maintain a healthy body weight. Ask your dietitian how much fat, calories, and other nutrients you need each day.  Eat small, frequent meals throughout the day instead of three large meals.  Drink at least 8-10 cups of fluid a day. Drink enough fluid to keep your urine clear or pale yellow.  Limit alcohol intake to no more than 1 drink a day for nonpregnant women and 2 drinks a day for men. One drink equals 12 oz of beer, 5 oz of wine, or 1 oz of hard liquor. Reading food labels  Check Nutrition Facts on food labels for the amount of fat per serving. Choose foods with less than 3 grams of fat per serving. Shopping  Choose nonfat and low-fat healthy foods. Look for the words "nonfat," "low fat," or "fat free."  Avoid buying processed or prepackaged foods. Cooking  Cook using low-fat methods, such as baking, broiling, grilling, or boiling.  Cook with small amounts of healthy fats, such as olive oil, grapeseed oil, canola oil, or sunflower oil. What foods are recommended?   All fresh, frozen, or canned fruits and vegetables.  Whole grains.  Low-fat or non-fat (skim) milk and yogurt.  Lean meat, skinless poultry, fish, eggs, and beans.  Low-fat protein supplement powders or  drinks.  Spices and herbs. What foods are not recommended?  High-fat foods. These include baked goods, fast food, fatty cuts of meat, ice cream, french toast, sweet rolls, pizza, cheese bread, foods covered with butter, creamy sauces, or cheese.  Fried foods. These include french fries, tempura, battered fish, breaded chicken, fried breads, and sweets.  Foods with strong odors.  Foods that cause bloating and gas. Summary  A low-fat diet can be helpful if you have a gallbladder condition, or before and after gallbladder surgery.  Limit your fat intake to less than 30% of your total daily calories. This is about 60 g of fat if you eat 1,800 calories each day.  Eat small, frequent meals throughout the day instead of three large meals. This information is not intended to replace advice given to you by your health care provider. Make sure you discuss any questions you have with your health care provider. Document Released: 10/25/2013 Document Revised: 11/27/2016 Document Reviewed: 11/27/2016 Elsevier Interactive Patient Education  2019 [employer].

## 2018-11-10 NOTE — Assessment & Plan Note (Signed)
No recent activity and he has stopped his Keppra, he has follow up appt with neurology soon

## 2018-11-10 NOTE — Assessment & Plan Note (Signed)
They stopped his losartan hoping that would help and it did not. Has not tried the [place] yet encouraged to try adding one dose midday away from his other meds.

## 2018-11-10 NOTE — Assessment & Plan Note (Signed)
Well controlled, no changes to meds. Encouraged heart healthy diet such as the DASH diet and exercise as tolerated.

## 2018-11-10 NOTE — Assessment & Plan Note (Signed)
hgba1c acceptable, minimize simple carbs. Increase exercise as tolerated.  

## 2018-11-10 NOTE — Progress Notes (Signed)
Subjective:    Patient ID: Darren Mueller, male    DOB: 1951-11-02, 68 y.o.   MRN: [id number]  No chief complaint on file.   HPI Patient is in today for follow-up.  He continues to struggle with frequent diarrhea and has numerous loose stool each day.  No bloody or tarry stool.  They tried stopping his losartan to no avail.  No recent febrile illness or hospitalization.  He continues drink alcohol but he does continue to report he tries to cut back.  He has stopped his Keppra and had no seizure activity. Denies CP/palp/SOB/HA/congestion/fevers/GI or GU c/o. Taking meds as prescribed  Past Medical History:  Diagnosis Date  . Alcohol dependence ([place]) 03/22/2011   In remission   . Alcohol dependence in remission ([place]) 03/22/2011   In remission   . Alcoholic cirrhosis of liver with ascites ([place]) 2014  . Dermatitis 06/10/2015  . Diarrhea 09/04/2013  . Elbow pain, left 03/17/2017  . GERD (gastroesophageal reflux disease)   . Gout   . Heart murmur   . Hx of colonic polyps   . Hypertension   . Hypertriglyceridemia 03/17/2017  . Hypopotassemia   . Otalgia 11/28/2013  . Other chronic pulmonary heart diseases   . Preventative health care 06/29/2016  . Red eye 03/03/2016   right  . Renal insufficiency 07/18/2013  . Seizures ([place])   . Sleep apnea    does not wear CPAP  . Thrombocytopenia ([place]) 06/29/2016  . Unspecified hypothyroidism 01/25/2013  . Unspecified pleural effusion     Past Surgical History:  Procedure Laterality Date  . CARDIAC CATHETERIZATION N/A 12/21/2015   Procedure: Right Heart Cath;  Surgeon: Jolaine Artist, MD;  Location: [facility];  Service: Cardiovascular;  Laterality: N/A;  . CARDIAC CATHETERIZATION N/A 04/15/2016   Procedure: Right/Left Heart Cath and Coronary Angiography;  Surgeon: Jolaine Artist, MD;  Location: [facility];  Service: Cardiovascular;  Laterality: N/A;  . COLONOSCOPY N/A 12/08/2013   Procedure: COLONOSCOPY;  Surgeon: Milus Banister,  MD;  Location: [facility];  Service: Endoscopy;  Laterality: N/A;  . ESOPHAGOGASTRODUODENOSCOPY (EGD) WITH PROPOFOL N/A 03/27/2016   Procedure: ESOPHAGOGASTRODUODENOSCOPY (EGD) WITH PROPOFOL;  Surgeon: Milus Banister, MD;  Location: [facility];  Service: Endoscopy;  Laterality: N/A;  . LOOP RECORDER INSERTION N/A 01/01/2017   Procedure: Loop Recorder Insertion;  Surgeon: Thompson Grayer, MD;  Location: [facility];  Service: Cardiovascular;  Laterality: N/A;  . RIGHT HEART CATHETERIZATION Right 06/06/2014   Procedure: RIGHT HEART CATH;  Surgeon: Jolaine Artist, MD;  Location[facility];  Service: Cardiovascular;  Laterality: Right;  . UVULOPALATOPHARYNGOPLASTY  1999    Family History  Problem Relation Age of Onset  . Heart disease Mother   . Heart attack Mother   . Hypertension Mother   . Kidney failure Mother   . Liver disease Mother        stage 4 liver disease  . Prostate cancer Father   . Cancer Father        lung  . Hypertension Brother   . Gout Brother   . Alcoholism Maternal Grandfather   . Liver disease Maternal Grandfather   . Migraines Sister   . Hypertension Brother   . Colon cancer Neg Hx     Social History   Socioeconomic History  . Marital status: Divorced    Spouse name: Not on file  . Number of children: 3  . Years of education: Not on  file  . Highest education level: Not on file  Occupational History  . Occupation: Retired    Comment: Microbiologist  . Financial resource strain: Not on file  . Food insecurity:    Worry: Not on file    Inability: Not on file  . Transportation needs:    Medical: Not on file    Non-medical: Not on file  Tobacco Use  . Smoking status: Former Smoker    Packs/day: 2.00    Years: 5.00    Pack years: 10.00    Types: Cigarettes    Last attempt to quit: 09/22/1979    Years since quitting: 39.1  . Smokeless tobacco: Never Used  Substance and Sexual Activity  . Alcohol use: Yes    Alcohol/week: 6.0  standard drinks    Types: 6 Shots of liquor per week    Comment: social drinker  . Drug use: No  . Sexual activity: Yes    Birth control/protection: Spermicide  Lifestyle  . Physical activity:    Days per week: Not on file    Minutes per session: Not on file  . Stress: Not on file  Relationships  . Social connections:    Talks on phone: Not on file    Gets together: Not on file    Attends religious service: Not on file    Active member of club or organization: Not on file    Attends meetings of clubs or organizations: Not on file    Relationship status: Not on file  . Intimate partner violence:    Fear of current or ex partner: Not on file    Emotionally abused: Not on file    Physically abused: Not on file    Forced sexual activity: Not on file  Other Topics Concern  . Not on file  Social History Narrative   0 caffeine drinks daily     Outpatient Medications Prior to Visit  Medication Sig Dispense Refill  . Albuterol Sulfate (PROAIR RESPICLICK) 384 (90 Base) MCG/ACT AEPB Inhale 2 puffs into the lungs every 6 (six) hours as needed (SOB or wheezing). 3 each 1  . allopurinol (ZYLOPRIM) 100 MG tablet Take 2 tablets (200 mg total) by mouth daily. 180 tablet 1  . azelastine (ASTELIN) 0.1 % nasal spray Place 2 sprays into both nostrils 2 (two) times daily. Use in each nostril as directed 30 mL 1  . cholestyramine (QUESTRAN) 4 g packet Take 1 packet (4 g total) by mouth 2 (two) times daily. 60 each 12  . colchicine 0.6 MG tablet Take 1 tablet (0.6 mg total) by mouth 2 (two) times daily as needed. 90 tablet 1  . diphenoxylate-atropine (LOMOTIL) 2.5-0.025 MG tablet Take 1 tablet by mouth 3 (three) times daily. 60 tablet 0  . fluticasone (FLONASE) 50 MCG/ACT nasal spray Place 2 sprays into both nostrils daily. 16 g 1  . fluticasone-salmeterol (ADVAIR HFA) 230-21 MCG/ACT inhaler Inhale 2 puffs into the lungs every morning. 48 g 1  . furosemide (LASIX) 40 MG tablet Take 1 tablet (40 mg  total) by mouth 3 (three) times a week. Every Mon, Wed and Friday 180 tablet 2  . hyoscyamine (LEVSIN SL) 0.125 MG SL tablet Place 1 tablet (0.125 mg total) under the tongue every 6 (six) hours as needed. 90 tablet 3  . ipratropium (ATROVENT HFA) 17 MCG/ACT inhaler Inhale 2 puffs into the lungs every 6 (six) hours as needed for wheezing. 1 Inhaler 12  . levETIRAcetam (KEPPRA)  500 MG tablet Take 1 tablet every night 90 tablet 1  . loperamide (IMODIUM) 2 MG capsule Take 2 mg by mouth as needed for diarrhea or loose stools.    . Macitentan (OPSUMIT) 10 MG TABS Take 1 tablet (10 mg total) by mouth daily. 30 tablet 11  . omeprazole (PRILOSEC) 40 MG capsule Take 1 capsule (40 mg total) by mouth daily. 90 capsule 3  . potassium chloride (K-DUR) 10 MEQ tablet Take 2 tablets (20 mEq total) by mouth daily. 60 tablet 3  . rosuvastatin (CRESTOR) 5 MG tablet Take 1 tablet (5 mg total) by mouth daily. 90 tablet 3  . Selexipag (UPTRAVI) 1000 MCG TABS Take 1,000 mcg by mouth 2 (two) times daily. 60 tablet 11  . spironolactone (ALDACTONE) 50 MG tablet TAKE 1 TABLET DAILY 90 tablet 1  . tadalafil, PAH, (ADCIRCA) 20 MG tablet TAKE 2 TABLETS (40 MG TOTAL) DAILY 60 tablet 12  . tiZANidine (ZANAFLEX) 2 MG tablet Take 0.5-2 tablets (1-4 mg total) by mouth every 6 (six) hours as needed for muscle spasms. 30 tablet 0  . doxycycline (VIBRA-TABS) 100 MG tablet Take 1 tablet (100 mg total) by mouth 2 (two) times daily. 20 tablet 0  . HYDROcodone-homatropine (HYCODAN) 5-1.5 MG/5ML syrup Take 5 mLs by mouth every 6 (six) hours as needed. 100 mL 0  . losartan (COZAAR) 50 MG tablet TAKE 1 TABLET DAILY (Patient not taking: Reported on 11/09/2018) 90 tablet 2   No facility-administered medications prior to visit.     Allergies  Allergen Reactions  . Aspirin Other (See Comments)    hypertension  . Nitroglycerin     Headache    Review of Systems  Constitutional: Positive for malaise/fatigue. Negative for fever.  HENT:  Negative for congestion.   Eyes: Negative for blurred vision.  Respiratory: Negative for shortness of breath.   Cardiovascular: Negative for chest pain, palpitations and leg swelling.  Gastrointestinal: Positive for diarrhea. Negative for abdominal pain, blood in stool, melena and nausea.  Genitourinary: Negative for dysuria and frequency.  Musculoskeletal: Negative for falls.  Skin: Negative for rash.  Neurological: Negative for dizziness, loss of consciousness and headaches.  Endo/Heme/Allergies: Negative for environmental allergies.  Psychiatric/Behavioral: Negative for depression. The patient is not nervous/anxious.        Objective:    Physical Exam Vitals signs and nursing note reviewed.  Constitutional:      General: He is not in acute distress.    Appearance: He is well-developed.  HENT:     Head: Normocephalic and atraumatic.     Nose: Nose normal.  Eyes:     General:        Right eye: No discharge.        Left eye: No discharge.  Neck:     Musculoskeletal: Normal range of motion and neck supple.  Cardiovascular:     Rate and Rhythm: Normal rate and regular rhythm.     Heart sounds: Murmur present.  Pulmonary:     Effort: Pulmonary effort is normal.     Breath sounds: Normal breath sounds.  Abdominal:     General: Bowel sounds are normal.     Palpations: Abdomen is soft.     Tenderness: There is no abdominal tenderness.  Skin:    General: Skin is warm and dry.  Neurological:     Mental Status: He is alert and oriented to person, place, and time.     BP 120/72 (BP Location: Left Arm, Patient Position: Sitting,  Cuff Size: Normal)   Pulse 91   Temp 97.8 F (36.6 C) (Oral)   Resp 18   Ht 6' (1.829 m)   Wt 191 lb 12.8 oz (87 kg)   SpO2 98%   BMI 26.01 kg/m  Wt Readings from Last 3 Encounters:  11/09/18 191 lb 12.8 oz (87 kg)  10/04/18 185 lb 14.4 oz (84.3 kg)  09/27/18 187 lb (84.8 kg)     Lab Results  Component Value Date   WBC 1.7 Repeated and  verified X2. (LL) 11/09/2018   HGB 11.2 (L) 11/09/2018   HCT 33.1 (L) 11/09/2018   PLT 32.0 Repeated and verified X2. (LL) 11/09/2018   GLUCOSE 115 (H) 11/09/2018   CHOL 109 08/09/2018   TRIG 114.0 08/09/2018   HDL 48.20 08/09/2018   LDLDIRECT 34.0 05/04/2018   LDLCALC 38 08/09/2018   ALT 23 11/09/2018   AST 66 (H) 11/09/2018   NA 135 11/09/2018   K 4.7 11/09/2018   CL 104 11/09/2018   CREATININE 1.87 (H) 11/09/2018   BUN 33 (H) 11/09/2018   CO2 25 11/09/2018   TSH 2.39 08/09/2018   PSA 0.49 11/01/2012   INR 1.25 06/04/2016   HGBA1C 4.8 08/09/2018    Lab Results  Component Value Date   TSH 2.39 08/09/2018   Lab Results  Component Value Date   WBC 1.7 Repeated and verified X2. (LL) 11/09/2018   HGB 11.2 (L) 11/09/2018   HCT 33.1 (L) 11/09/2018   MCV 108.7 (H) 11/09/2018   PLT 32.0 Repeated and verified X2. (LL) 11/09/2018   Lab Results  Component Value Date   NA 135 11/09/2018   K 4.7 11/09/2018   CO2 25 11/09/2018   GLUCOSE 115 (H) 11/09/2018   BUN 33 (H) 11/09/2018   CREATININE 1.87 (H) 11/09/2018   BILITOT 1.6 (H) 11/09/2018   ALKPHOS 264 (H) 11/09/2018   AST 66 (H) 11/09/2018   ALT 23 11/09/2018   PROT 6.9 11/09/2018   ALBUMIN 3.7 11/09/2018   CALCIUM 8.6 11/09/2018   ANIONGAP 8 07/22/2018   GFR 46.30 (L) 11/09/2018   Lab Results  Component Value Date   CHOL 109 08/09/2018   Lab Results  Component Value Date   HDL 48.20 08/09/2018   Lab Results  Component Value Date   LDLCALC 38 08/09/2018   Lab Results  Component Value Date   TRIG 114.0 08/09/2018   Lab Results  Component Value Date   CHOLHDL 2 08/09/2018   Lab Results  Component Value Date   HGBA1C 4.8 08/09/2018       Assessment & Plan:   Problem List Items Addressed This Visit    Hypertension - Primary (Chronic)    Well controlled, no changes to meds. Encouraged heart healthy diet such as the DASH diet and exercise as tolerated.       Relevant Orders   Comprehensive  metabolic panel (Completed)   CBC w/Diff (Completed)   Chronic renal insufficiency, stage III (moderate) (HCC) (Chronic)    Monitor, hydrate and minimize alcohol      Hypothyroidism   Diarrhea    They stopped his losartan hoping that would help and it did not. Has not tried the [place] yet encouraged to try adding one dose midday away from his other meds.      Relevant Orders   US Abdomen Complete   Gout   Relevant Orders   Uric acid (Completed)   Seizure disorder (HCC)    No recent activity and he  has stopped his Keppra, he has follow up appt with neurology soon      Hyperglycemia    hgba1c acceptable, minimize simple carbs. Increase exercise as tolerated.       Other Visit Diagnoses    Acute pain of right shoulder       Abdominal pain, unspecified abdominal location          I have discontinued Valerian Liming's doxycycline and HYDROcodone-homatropine. I am also having him maintain his spironolactone, ipratropium, macitentan, furosemide, allopurinol, tiZANidine, cholestyramine, tadalafil (PAH), loperamide, levETIRAcetam, diphenoxylate-atropine, Albuterol Sulfate, fluticasone-salmeterol, losartan, hyoscyamine, Selexipag, rosuvastatin, omeprazole, potassium chloride, fluticasone, colchicine, and azelastine.  No orders of the defined types were placed in this encounter.    Penni Homans, MD

## 2018-11-10 NOTE — Assessment & Plan Note (Signed)
Monitor, hydrate and minimize alcohol

## 2018-11-11 ENCOUNTER — Ambulatory Visit (HOSPITAL_BASED_OUTPATIENT_CLINIC_OR_DEPARTMENT_OTHER)
Admission: RE | Admit: 2018-11-11 | Discharge: 2018-11-11 | Disposition: A | Discharge status: Home / Self Care | Payer: Medicare Other | Source: Ambulatory Visit | Attending: Family Medicine | Admitting: Family Medicine

## 2018-11-11 DIAGNOSIS — N189 Chronic kidney disease, unspecified: Secondary | ICD-10-CM | POA: Diagnosis not present

## 2018-11-11 DIAGNOSIS — R197 Diarrhea, unspecified: Secondary | ICD-10-CM | POA: Insufficient documentation

## 2018-11-19 ENCOUNTER — Other Ambulatory Visit (HOSPITAL_COMMUNITY): Payer: Self-pay

## 2018-11-19 DIAGNOSIS — I272 Pulmonary hypertension, unspecified: Secondary | ICD-10-CM

## 2018-11-19 MED ORDER — MACITENTAN 10 MG PO TABS: 10.0000 mg | ORAL_TABLET | Freq: Every day | ORAL | 11 refills | Status: DC

## 2018-11-24 DIAGNOSIS — H2513 Age-related nuclear cataract, bilateral: Secondary | ICD-10-CM | POA: Diagnosis not present

## 2018-11-24 DIAGNOSIS — H3554 Dystrophies primarily involving the retinal pigment epithelium: Secondary | ICD-10-CM | POA: Diagnosis not present

## 2018-11-24 DIAGNOSIS — Q141 Congenital malformation of retina: Secondary | ICD-10-CM | POA: Diagnosis not present

## 2018-11-25 ENCOUNTER — Ambulatory Visit (INDEPENDENT_AMBULATORY_CARE_PROVIDER_SITE_OTHER): Payer: Medicare Other

## 2018-11-25 DIAGNOSIS — R55 Syncope and collapse: Secondary | ICD-10-CM | POA: Diagnosis not present

## 2018-11-26 NOTE — Progress Notes (Signed)
[facility] Summary Report / Loop Recorder

## 2018-11-28 LAB — CUP PACEART REMOTE DEVICE CHECK
Date Time Interrogation Session: 20200124033714
Implantable Pulse Generator Implant Date: 20180301

## 2018-12-14 DIAGNOSIS — Z01818 Encounter for other preprocedural examination: Secondary | ICD-10-CM | POA: Diagnosis not present

## 2018-12-14 DIAGNOSIS — H2512 Age-related nuclear cataract, left eye: Secondary | ICD-10-CM | POA: Diagnosis not present

## 2018-12-14 DIAGNOSIS — H2511 Age-related nuclear cataract, right eye: Secondary | ICD-10-CM | POA: Diagnosis not present

## 2018-12-14 DIAGNOSIS — Q141 Congenital malformation of retina: Secondary | ICD-10-CM | POA: Diagnosis not present

## 2018-12-16 ENCOUNTER — Telehealth (HOSPITAL_COMMUNITY): Payer: Self-pay

## 2018-12-16 ENCOUNTER — Encounter: Payer: Self-pay | Admitting: Family Medicine

## 2018-12-16 NOTE — Telephone Encounter (Signed)
Surgical Clearance faxed to [facility] [place]. Fax receipt received.

## 2018-12-28 ENCOUNTER — Ambulatory Visit (INDEPENDENT_AMBULATORY_CARE_PROVIDER_SITE_OTHER): Payer: Medicare Other | Admitting: *Deleted

## 2018-12-28 DIAGNOSIS — R55 Syncope and collapse: Secondary | ICD-10-CM | POA: Diagnosis not present

## 2018-12-29 LAB — CUP PACEART REMOTE DEVICE CHECK
Date Time Interrogation Session: 20200226033609
Implantable Pulse Generator Implant Date: 20180301

## 2018-12-30 ENCOUNTER — Ambulatory Visit (INDEPENDENT_AMBULATORY_CARE_PROVIDER_SITE_OTHER): Payer: Medicare Other | Admitting: Neurology

## 2018-12-30 ENCOUNTER — Other Ambulatory Visit: Payer: Self-pay

## 2018-12-30 ENCOUNTER — Encounter: Payer: Self-pay | Admitting: Neurology

## 2018-12-30 VITALS — BP 142/70 | HR 89 | Ht 72.0 in | Wt 204.0 lb

## 2018-12-30 DIAGNOSIS — R569 Unspecified convulsions: Secondary | ICD-10-CM

## 2018-12-30 DIAGNOSIS — R55 Syncope and collapse: Secondary | ICD-10-CM | POA: Diagnosis not present

## 2018-12-30 MED FILL — GATIFLOXACIN 0.5% EYE DROPS: 0.5 | 30 days supply | Qty: 5 | Fill #0

## 2018-12-30 MED FILL — PREDNISOLONE AC 1% EYE DROP: 1 | 30 days supply | Qty: 5 | Fill #0

## 2018-12-30 NOTE — Patient Instructions (Signed)
Take care you yourself! Follow-up as needed, call for any changes

## 2018-12-30 NOTE — Progress Notes (Signed)
NEUROLOGY FOLLOW UP OFFICE NOTE  Darren Mueller [id number] October 15, 1951  HISTORY OF PRESENT ILLNESS: I had the pleasure of seeing Darren Mueller in follow-up in the neurology clinic on 12/30/2018.  The patient was last seen 7 months ago after an episode of shaking last 11/18/17. Head CT no acute changes. His 1-hour EEG and 24-hour EEG were normal, typical events were not captured. He stopped low dose Keppra 514m daily around 2 months ago. He continues to deny any further shaking spells since January 2019. He lives with his son who has not mentioned any concerns, no staring/unresponsive episodes, gaps in time, olfactory/gustatory hallucinations, focal numbness/tingling/weakness, no myoclonic jerks. He denies any headaches, dizziness, vision changes, no falls.   HPI 01/25/18: This is a pleasant 68yo LH man with a history of hypertension, hyperlipidemia, syncope,  Who had a witnessed shaking episode last 11/18/17. He recalls watching TV then has no recollection until the ambulance was there. His son reported he was sitting on the couch then started shaking. He recalls feeling dizzy when he came to. He denied any muscle soreness, tongue bite, or incontinence. [facility] showed was remarkable for a WBC of 2.6, Hct 33.9, platelet count of 43, creatinine of 2.09, AST 65, alkaline phos 171, total bilirubin 2.2, and elevated EtOH level of 164 and elevated lactic acid of 2.14. CK normal. He recalls having 3 beers that night. I personally reviewed head CT without contrast which did not show any acute changes, there was mild atrophy and chronic microvascular disease. He has had several syncopal episodes in the past, he was admitted in August 2017 and September 2017, with this admission he had an elevated alcohol level and rib fracture. He wore a 30-day monitor with no events captured, no arrhythmia. He had another syncopal episode in February 2018 and had a loop recorder placed. No  arrhythmia seen on loop recorder. Due to report of prior seizures and recurrent syncope, he was discharged home on Keppra 5031mBID, but has only been taking it once a day.   He has been living with his son the past 3 years, he denies being told of any staring/unresponsive episodes, he denies any other gaps in time, no olfactory/gustatory hallucinations, deja vu, rising epigastric sensation, focal numbness/tingling/weakness, myoclonic jerks. When he was in the ER last January, there is report of him having seizure in the past but never taking seizure medication. Today he denies any prior history of seizures. [place] [employer] have [place] him to do that if he had seizures. He has dizziness every now and then where he feels lightheaded for a couple of seconds when he gets up too fast. He denies any headaches, diplopia, dysarthria/dysphagia, neck/back pain, bladder dysfunction. He has occasional diarrhea. He had a normal birth and early development.  There is no history of febrile convulsions, CNS infections such as meningitis/encephalitis, significant traumatic brain injury, neurosurgical procedures, or family history of seizures. He reports drinking 1-2 beers at night now.  PAST MEDICAL HISTORY: Past Medical History:  Diagnosis Date  . [place] dependence ([facility]   In remission   . [place] dependence in [place] ([facility]   In remission   . Alcoholic cirrhosis of liver with ascites ([facility]  . Dermatitis 06/10/2015  . Diarrhea 09/04/2013  . Elbow pain, left 03/17/2017  . GERD (gastroesophageal reflux disease)   . Gout   . Heart murmur   .  Hx of colonic polyps   . Hypertension   . Hypertriglyceridemia 03/17/2017  . Hypopotassemia   . Otalgia 11/28/2013  . Other chronic pulmonary heart diseases   . Preventative health care 06/29/2016  . Red eye 03/03/2016   right  . Renal insufficiency 07/18/2013  . Seizures ([place])   . Sleep apnea    does not wear  CPAP  . Thrombocytopenia ([place]) 06/29/2016  . Unspecified hypothyroidism 01/25/2013  . Unspecified pleural effusion     MEDICATIONS: Current Outpatient Medications on File Prior to Visit  Medication Sig Dispense Refill  . Albuterol Sulfate (PROAIR RESPICLICK) 702 (90 Base) MCG/ACT AEPB Inhale 2 puffs into the lungs every 6 (six) hours as needed (SOB or wheezing). 3 each 1  . allopurinol (ZYLOPRIM) 100 MG tablet Take 2 tablets (200 mg total) by mouth daily. 180 tablet 1  . azelastine (ASTELIN) 0.1 % nasal spray Place 2 sprays into both nostrils 2 (two) times daily. Use in each nostril as directed 30 mL 1  . cholestyramine (QUESTRAN) 4 g packet Take 1 packet (4 g total) by mouth 2 (two) times daily. 60 each 12  . colchicine 0.6 MG tablet Take 1 tablet (0.6 mg total) by mouth 2 (two) times daily as needed. 90 tablet 1  . diphenoxylate-atropine (LOMOTIL) 2.5-0.025 MG tablet Take 1 tablet by mouth 3 (three) times daily. 60 tablet 0  . fluticasone (FLONASE) 50 MCG/ACT nasal spray Place 2 sprays into both nostrils daily. 16 g 1  . fluticasone-salmeterol (ADVAIR HFA) 230-21 MCG/ACT inhaler Inhale 2 puffs into the lungs every morning. 48 g 1  . furosemide (LASIX) 40 MG tablet Take 1 tablet (40 mg total) by mouth 3 (three) times a week. Every Mon, Wed and Friday 180 tablet 2  . hyoscyamine (LEVSIN SL) 0.125 MG SL tablet Place 1 tablet (0.125 mg total) under the tongue every 6 (six) hours as needed. 90 tablet 3  . ipratropium (ATROVENT HFA) 17 MCG/ACT inhaler Inhale 2 puffs into the lungs every 6 (six) hours as needed for wheezing. 1 Inhaler 12  . levETIRAcetam (KEPPRA) 500 MG tablet Take 1 tablet every night 90 tablet 1  . loperamide (IMODIUM) 2 MG capsule Take 2 mg by mouth as needed for diarrhea or loose stools.    Marland Kitchen losartan (COZAAR) 50 MG tablet TAKE 1 TABLET DAILY 90 tablet 2  . macitentan (OPSUMIT) 10 MG tablet Take 1 tablet (10 mg total) by mouth daily. 30 tablet 11  . omeprazole (PRILOSEC) 40 MG  capsule Take 1 capsule (40 mg total) by mouth daily. 90 capsule 3  . potassium chloride (K-DUR) 10 MEQ tablet Take 2 tablets (20 mEq total) by mouth daily. 60 tablet 3  . rosuvastatin (CRESTOR) 5 MG tablet Take 1 tablet (5 mg total) by mouth daily. 90 tablet 3  . Selexipag (UPTRAVI) 1000 MCG TABS Take 1,000 mcg by mouth 2 (two) times daily. 60 tablet 11  . spironolactone (ALDACTONE) 50 MG tablet TAKE 1 TABLET DAILY 90 tablet 1  . tadalafil, PAH, (ADCIRCA) 20 MG tablet TAKE 2 TABLETS (40 MG TOTAL) DAILY 60 tablet 12  . tiZANidine (ZANAFLEX) 2 MG tablet Take 0.5-2 tablets (1-4 mg total) by mouth every 6 (six) hours as needed for muscle spasms. 30 tablet 0   No current facility-administered medications on file prior to visit.     ALLERGIES: Allergies  Allergen Reactions  . Aspirin Other (See Comments)    hypertension  . Nitroglycerin     Headache  FAMILY HISTORY: Family History  Problem Relation Age of Onset  . Heart disease Mother   . Heart attack Mother   . Hypertension Mother   . Kidney failure Mother   . Liver disease Mother        stage 4 liver disease  . Prostate cancer Father   . Cancer Father        lung  . Hypertension Brother   . Gout Brother   . Alcoholism Maternal Grandfather   . Liver disease Maternal Grandfather   . Migraines Sister   . Hypertension Brother   . Colon cancer Neg Hx     SOCIAL HISTORY: Social History   Socioeconomic History  . Marital status: Divorced    Spouse name: Not on file  . Number of children: 3  . Years of education: Not on file  . Highest education level: Not on file  Occupational History  . Occupation: Retired    Comment: Microbiologist  . Financial resource strain: Not on file  . Food insecurity:    Worry: Not on file    Inability: Not on file  . Transportation needs:    Medical: Not on file    Non-medical: Not on file  Tobacco Use  . Smoking status: Former Smoker    Packs/day: 2.00    Years: 5.00     Pack years: 10.00    Types: Cigarettes    Last attempt to quit: 09/22/1979    Years since quitting: 39.2  . Smokeless tobacco: Never Used  Substance and Sexual Activity  . Alcohol use: Yes    Alcohol/week: 6.0 standard drinks    Types: 6 Shots of liquor per week    Comment: social drinker  . Drug use: No  . Sexual activity: Yes    Birth control/protection: Spermicide  Lifestyle  . Physical activity:    Days per week: Not on file    Minutes per session: Not on file  . Stress: Not on file  Relationships  . Social connections:    Talks on phone: Not on file    Gets together: Not on file    Attends religious service: Not on file    Active member of club or organization: Not on file    Attends meetings of clubs or organizations: Not on file    Relationship status: Not on file  . Intimate partner violence:    Fear of current or ex partner: Not on file    Emotionally abused: Not on file    Physically abused: Not on file    Forced sexual activity: Not on file  Other Topics Concern  . Not on file  Social History Narrative   0 caffeine drinks daily     REVIEW OF SYSTEMS: Constitutional: No fevers, chills, or sweats, no generalized fatigue, change in appetite Eyes: No visual changes, double vision, eye pain Ear, nose and throat: No hearing loss, ear pain, nasal congestion, sore throat Cardiovascular: No chest pain, palpitations Respiratory:  No shortness of breath at rest or with exertion, wheezes GastrointestinaI: No nausea, vomiting, diarrhea, abdominal pain, fecal incontinence Genitourinary:  No dysuria, urinary retention or frequency Musculoskeletal:  No neck pain, back pain Integumentary: No rash, pruritus, skin lesions Neurological: as above Psychiatric: No depression, insomnia, anxiety Endocrine: No palpitations, fatigue, diaphoresis, mood swings, change in appetite, change in weight, increased thirst Hematologic/Lymphatic:  No anemia, purpura,  petechiae. Allergic/Immunologic: no itchy/runny eyes, nasal congestion, recent allergic reactions, rashes  PHYSICAL EXAM: Vitals:  12/30/18 1000  BP: (!) 142/70  Pulse: 89  SpO2: 95%   General: No acute distress Head:  Normocephalic/atraumatic Neck: supple, no paraspinal tenderness, full range of motion Heart:  Regular rate and rhythm Lungs:  Clear to auscultation bilaterally Back: No paraspinal tenderness Skin/Extremities: No rash, no edema Neurological Exam: alert and oriented to person, place, and time. No aphasia or dysarthria. Fund of knowledge is appropriate.  Recent and remote memory are intact.  Attention and concentration are normal.    Able to name objects and repeat phrases. Cranial nerves: Pupils equal, round, reactive to light. Extraocular movements intact with no nystagmus. Visual fields full. Facial sensation intact. No facial asymmetry. Tongue, uvula, palate midline.  Motor: Bulk and tone normal, muscle strength 5/5 throughout with no pronator drift.  Sensation to light touch intact.  No extinction to double simultaneous stimulation.  Deep tendon reflexes 2+ throughout, toes downgoing.  Finger to nose testing intact.  Gait wide-based, difficulty with tandem-walk  IMPRESSION: This is a pleasant 68 yo LH man with a history of hypertension, hyperlipidemia, syncope, who had a witnessed shaking episode last 11/18/17. His neurological exam is normal, head CT normal and 24-hour EEG normal. Cardiac workup unremarkable. Etiology of seizure unclear, he has not had any further shaking episodes since January 2019 and stopped Keppra 2 months ago. We again discussed 10% of the population may have a single seizure. Patients with a single unprovoked seizure have a recurrence rate of 33%  after a single seizure and 73% after a second seizure. He knows to call our office for any change in symptoms. We discussed avoidance of seizure triggers, including alcohol. He is aware of [place] driving laws to  stop driving after an episode of loss of consciousness until 6 months event-free. He will follow-up as needed and knows to call for any changes.   Thank you for allowing me to participate in his care.  Please do not hesitate to call for any questions or concerns.  The duration of this appointment visit was 20 minutes of face-to-face time with the patient.  Greater than 50% of this time was spent in counseling, explanation of diagnosis, planning of further management, and coordination of care.   Ellouise Newer, M.D.   CC: Dr. Charlett Blake, Dr. Haroldine Laws

## 2019-01-01 ENCOUNTER — Encounter: Payer: Self-pay | Admitting: Gastroenterology

## 2019-01-04 NOTE — Progress Notes (Signed)
[facility] Summary Report / Loop Recorder

## 2019-01-06 DIAGNOSIS — H25811 Combined forms of age-related cataract, right eye: Secondary | ICD-10-CM | POA: Diagnosis not present

## 2019-01-06 DIAGNOSIS — H2511 Age-related nuclear cataract, right eye: Secondary | ICD-10-CM | POA: Diagnosis not present

## 2019-01-11 ENCOUNTER — Encounter: Payer: Self-pay | Admitting: Family Medicine

## 2019-01-17 MED FILL — POTASSIUM CL ER 10 MEQ TAB: 10 | 30 days supply | Qty: 60 | Fill #2

## 2019-01-31 ENCOUNTER — Other Ambulatory Visit: Payer: Self-pay

## 2019-01-31 ENCOUNTER — Ambulatory Visit (INDEPENDENT_AMBULATORY_CARE_PROVIDER_SITE_OTHER): Payer: Medicare Other | Admitting: *Deleted

## 2019-01-31 DIAGNOSIS — R55 Syncope and collapse: Secondary | ICD-10-CM | POA: Diagnosis not present

## 2019-01-31 LAB — CUP PACEART REMOTE DEVICE CHECK
Date Time Interrogation Session: 20200330101043
MDC IDC PG IMPLANT DT: 20180301

## 2019-02-02 DIAGNOSIS — I517 Cardiomegaly: Secondary | ICD-10-CM

## 2019-02-02 DIAGNOSIS — N2581 Secondary hyperparathyroidism of renal origin: Secondary | ICD-10-CM | POA: Diagnosis not present

## 2019-02-02 DIAGNOSIS — G4733 Obstructive sleep apnea (adult) (pediatric): Secondary | ICD-10-CM | POA: Diagnosis not present

## 2019-02-02 DIAGNOSIS — I129 Hypertensive chronic kidney disease with stage 1 through stage 4 chronic kidney disease, or unspecified chronic kidney disease: Secondary | ICD-10-CM | POA: Diagnosis not present

## 2019-02-02 DIAGNOSIS — D631 Anemia in chronic kidney disease: Secondary | ICD-10-CM | POA: Diagnosis not present

## 2019-02-02 DIAGNOSIS — K921 Melena: Secondary | ICD-10-CM | POA: Diagnosis not present

## 2019-02-02 DIAGNOSIS — D6959 Other secondary thrombocytopenia: Secondary | ICD-10-CM | POA: Diagnosis not present

## 2019-02-02 DIAGNOSIS — I2721 Secondary pulmonary arterial hypertension: Secondary | ICD-10-CM | POA: Diagnosis not present

## 2019-02-02 DIAGNOSIS — N183 Chronic kidney disease, stage 3 (moderate): Secondary | ICD-10-CM | POA: Diagnosis not present

## 2019-02-02 DIAGNOSIS — N189 Chronic kidney disease, unspecified: Secondary | ICD-10-CM | POA: Diagnosis not present

## 2019-02-02 DIAGNOSIS — R809 Proteinuria, unspecified: Secondary | ICD-10-CM | POA: Diagnosis not present

## 2019-02-02 HISTORY — DX: Cardiomegaly: I51.7

## 2019-02-03 ENCOUNTER — Ambulatory Visit: Payer: Medicare Other | Admitting: Family Medicine

## 2019-02-03 MED FILL — TORSEMIDE 20 MG TABLET: 20 | 30 days supply | Qty: 60 | Fill #0

## 2019-02-04 ENCOUNTER — Telehealth: Payer: Self-pay | Admitting: Family Medicine

## 2019-02-04 ENCOUNTER — Ambulatory Visit: Payer: Medicare Other | Admitting: Family Medicine

## 2019-02-04 NOTE — Telephone Encounter (Signed)
Pt came in office dropping off copy of his last appt visit with Dr Juanell Fairly ([place]) Pt stated that Dr Owens Shark gave him a new rx Torsemide 20 mg tablet twice a day. Pt wants to know is it ok to take this rx with the other rx that he has been taken. Pt stated also has some other question about his visit with [place] but want to clarify with his PCP. Please advise ASAP. Pt Tel [phone]. (Copy of document AVS given to CMA)

## 2019-02-04 NOTE — Telephone Encounter (Signed)
Paperwork forwarded to PCP

## 2019-02-08 ENCOUNTER — Telehealth: Payer: Self-pay | Admitting: Oncology

## 2019-02-08 NOTE — Progress Notes (Signed)
[facility] Summary Report / Loop Recorder

## 2019-02-08 NOTE — Telephone Encounter (Signed)
Rescheduled 4/17 appt per 4/2 sch msg. Called patient. No answer, left msg.

## 2019-02-12 ENCOUNTER — Other Ambulatory Visit: Payer: Self-pay

## 2019-02-12 ENCOUNTER — Encounter: Payer: Self-pay | Admitting: Family Medicine

## 2019-02-12 ENCOUNTER — Observation Stay (HOSPITAL_COMMUNITY)
Admission: EM | Admit: 2019-02-12 | Discharge: 2019-02-14 | Disposition: A | Discharge status: Home / Self Care | Payer: Medicare Other | Attending: Internal Medicine | Admitting: Internal Medicine

## 2019-02-12 DIAGNOSIS — K625 Hemorrhage of anus and rectum: Secondary | ICD-10-CM | POA: Diagnosis not present

## 2019-02-12 DIAGNOSIS — Z8669 Personal history of other diseases of the nervous system and sense organs: Secondary | ICD-10-CM | POA: Insufficient documentation

## 2019-02-12 DIAGNOSIS — I130 Hypertensive heart and chronic kidney disease with heart failure and stage 1 through stage 4 chronic kidney disease, or unspecified chronic kidney disease: Secondary | ICD-10-CM | POA: Insufficient documentation

## 2019-02-12 DIAGNOSIS — I50810 Right heart failure, unspecified: Secondary | ICD-10-CM | POA: Diagnosis present

## 2019-02-12 DIAGNOSIS — K449 Diaphragmatic hernia without obstruction or gangrene: Secondary | ICD-10-CM | POA: Insufficient documentation

## 2019-02-12 DIAGNOSIS — Z7951 Long term (current) use of inhaled steroids: Secondary | ICD-10-CM | POA: Insufficient documentation

## 2019-02-12 DIAGNOSIS — K766 Portal hypertension: Secondary | ICD-10-CM | POA: Diagnosis not present

## 2019-02-12 DIAGNOSIS — F1010 Alcohol abuse, uncomplicated: Secondary | ICD-10-CM | POA: Insufficient documentation

## 2019-02-12 DIAGNOSIS — K7031 Alcoholic cirrhosis of liver with ascites: Secondary | ICD-10-CM | POA: Diagnosis not present

## 2019-02-12 DIAGNOSIS — D696 Thrombocytopenia, unspecified: Secondary | ICD-10-CM | POA: Diagnosis present

## 2019-02-12 DIAGNOSIS — Z86010 Personal history of colonic polyps: Secondary | ICD-10-CM | POA: Insufficient documentation

## 2019-02-12 DIAGNOSIS — E781 Pure hyperglyceridemia: Secondary | ICD-10-CM | POA: Diagnosis not present

## 2019-02-12 DIAGNOSIS — K219 Gastro-esophageal reflux disease without esophagitis: Secondary | ICD-10-CM | POA: Diagnosis not present

## 2019-02-12 DIAGNOSIS — R188 Other ascites: Secondary | ICD-10-CM

## 2019-02-12 DIAGNOSIS — D61818 Other pancytopenia: Secondary | ICD-10-CM | POA: Diagnosis present

## 2019-02-12 DIAGNOSIS — G40909 Epilepsy, unspecified, not intractable, without status epilepticus: Secondary | ICD-10-CM

## 2019-02-12 DIAGNOSIS — Z79899 Other long term (current) drug therapy: Secondary | ICD-10-CM | POA: Diagnosis not present

## 2019-02-12 DIAGNOSIS — I50813 Acute on chronic right heart failure: Secondary | ICD-10-CM | POA: Insufficient documentation

## 2019-02-12 DIAGNOSIS — R195 Other fecal abnormalities: Secondary | ICD-10-CM

## 2019-02-12 DIAGNOSIS — N183 Chronic kidney disease, stage 3 (moderate): Secondary | ICD-10-CM | POA: Insufficient documentation

## 2019-02-12 DIAGNOSIS — K921 Melena: Principal | ICD-10-CM | POA: Insufficient documentation

## 2019-02-12 DIAGNOSIS — N184 Chronic kidney disease, stage 4 (severe): Secondary | ICD-10-CM | POA: Diagnosis present

## 2019-02-12 DIAGNOSIS — K7030 Alcoholic cirrhosis of liver without ascites: Secondary | ICD-10-CM | POA: Diagnosis present

## 2019-02-12 DIAGNOSIS — I8510 Secondary esophageal varices without bleeding: Secondary | ICD-10-CM | POA: Insufficient documentation

## 2019-02-12 DIAGNOSIS — M109 Gout, unspecified: Secondary | ICD-10-CM | POA: Diagnosis not present

## 2019-02-12 DIAGNOSIS — I509 Heart failure, unspecified: Secondary | ICD-10-CM | POA: Diagnosis not present

## 2019-02-12 DIAGNOSIS — N179 Acute kidney failure, unspecified: Secondary | ICD-10-CM | POA: Insufficient documentation

## 2019-02-12 DIAGNOSIS — D509 Iron deficiency anemia, unspecified: Secondary | ICD-10-CM | POA: Diagnosis not present

## 2019-02-12 DIAGNOSIS — K3189 Other diseases of stomach and duodenum: Secondary | ICD-10-CM | POA: Diagnosis not present

## 2019-02-12 DIAGNOSIS — I2729 Other secondary pulmonary hypertension: Secondary | ICD-10-CM | POA: Diagnosis present

## 2019-02-12 DIAGNOSIS — I10 Essential (primary) hypertension: Secondary | ICD-10-CM | POA: Diagnosis present

## 2019-02-12 DIAGNOSIS — K7460 Unspecified cirrhosis of liver: Secondary | ICD-10-CM | POA: Diagnosis present

## 2019-02-12 DIAGNOSIS — Z87891 Personal history of nicotine dependence: Secondary | ICD-10-CM | POA: Diagnosis not present

## 2019-02-12 DIAGNOSIS — J449 Chronic obstructive pulmonary disease, unspecified: Secondary | ICD-10-CM | POA: Diagnosis not present

## 2019-02-12 DIAGNOSIS — I2721 Secondary pulmonary arterial hypertension: Secondary | ICD-10-CM | POA: Diagnosis not present

## 2019-02-12 DIAGNOSIS — F1020 Alcohol dependence, uncomplicated: Secondary | ICD-10-CM

## 2019-02-12 DIAGNOSIS — R0602 Shortness of breath: Secondary | ICD-10-CM | POA: Diagnosis not present

## 2019-02-12 DIAGNOSIS — E039 Hypothyroidism, unspecified: Secondary | ICD-10-CM | POA: Diagnosis present

## 2019-02-12 DIAGNOSIS — R079 Chest pain, unspecified: Secondary | ICD-10-CM | POA: Diagnosis present

## 2019-02-12 DIAGNOSIS — G4733 Obstructive sleep apnea (adult) (pediatric): Secondary | ICD-10-CM | POA: Insufficient documentation

## 2019-02-12 DIAGNOSIS — I110 Hypertensive heart disease with heart failure: Secondary | ICD-10-CM | POA: Diagnosis not present

## 2019-02-12 DIAGNOSIS — I2510 Atherosclerotic heart disease of native coronary artery without angina pectoris: Secondary | ICD-10-CM | POA: Insufficient documentation

## 2019-02-12 HISTORY — DX: Atherosclerotic heart disease of native coronary artery without angina pectoris: I25.10

## 2019-02-12 HISTORY — DX: Dyspnea, unspecified: R06.00

## 2019-02-12 HISTORY — DX: Angina pectoris, unspecified: I20.9

## 2019-02-12 HISTORY — DX: Headache: R51

## 2019-02-12 HISTORY — DX: Heart failure, unspecified: I50.9

## 2019-02-12 HISTORY — DX: Chronic obstructive pulmonary disease, unspecified: J44.9

## 2019-02-12 NOTE — ED Provider Notes (Signed)
[facility] EMERGENCY DEPARTMENT Provider Note   CSN: [id number] Arrival date & time: 02/12/19  2331    History   Chief Complaint Chief Complaint  Patient presents with  . Rectal Bleeding    HPI Darren Mueller is a 68 y.o. male.     Patient presents to the emergency department from home with multiple problems.  Patient is here primarily tonight because he had a bowel movement and reports that there was a large amount of red blood.  He has not been experiencing any significant abdominal pain.  He has not had any constipation or fever.  Patient also reports increased swelling of both of his legs.  He has a history of cirrhosis as well as congestive heart failure.  He reports shortness of breath and occasional/intermittent chest pain.     Past Medical History:  Diagnosis Date  . Alcohol dependence ([place]) 03/22/2011   In remission   . Alcohol dependence in remission ([place]) 03/22/2011   In remission   . Alcoholic cirrhosis of liver with ascites ([place]) 2014  . Dermatitis 06/10/2015  . Diarrhea 09/04/2013  . Elbow pain, left 03/17/2017  . GERD (gastroesophageal reflux disease)   . Gout   . Heart murmur   . Hx of colonic polyps   . Hypertension   . Hypertriglyceridemia 03/17/2017  . Hypopotassemia   . Otalgia 11/28/2013  . Other chronic pulmonary heart diseases   . Preventative health care 06/29/2016  . Red eye 03/03/2016   right  . Renal insufficiency 07/18/2013  . Seizures ([place])   . Sleep apnea    does not wear CPAP  . Thrombocytopenia ([place]) 06/29/2016  . Unspecified hypothyroidism 01/25/2013  . Unspecified pleural effusion     Patient Active Problem List   Diagnosis Date Noted  . Hyperglycemia 05/04/2018  . Seizure disorder ([place]) 02/01/2018  . Pedal edema 10/01/2017  . RUQ pain 05/06/2017  . Decreased range of motion of left elbow 04/15/2017  . Hyperlipidemia 03/17/2017  . Elbow pain, left 03/17/2017  . Right rib fracture   . Syncope 07/19/2016  .  Thrombocytopenia ([place]) 06/29/2016  . Hoarseness, chronic 06/29/2016  . Preventative health care 06/29/2016  . Low back pain 12/09/2015  . Cirrhosis with alcoholism ([place]) 08/08/2015  . Pancytopenia ([place]) 06/10/2015  . Dermatitis 06/10/2015  . OSA (obstructive sleep apnea) 02/13/2015  . Gout 07/30/2014  . Diarrhea 09/04/2013  . Chronic renal insufficiency, stage III (moderate) ([place]) 07/18/2013  . Right heart failure due to pulmonary hypertension ([place]) 06/06/2013  . Hypothyroidism 01/25/2013  . PAH (pulmonary arterial hypertension) with portal hypertension ([place]) 01/21/2013  . Allergic rhinitis 12/21/2012  . Secondary pulmonary hypertension 12/09/2012  . Hepatic cirrhosis ([place]) 11/26/2012  . GERD (gastroesophageal reflux disease) 09/05/2011  . Hypokalemia 08/29/2011  . Colon polyps 03/26/2011  . Insomnia 03/26/2011  . Hypertension 03/22/2011  . Alcohol dependence ([place]) 03/22/2011    Past Surgical History:  Procedure Laterality Date  . CARDIAC CATHETERIZATION N/A 12/21/2015   Procedure: Right Heart Cath;  Surgeon: Jolaine Artist, MD;  Location: [facility];  Service: Cardiovascular;  Laterality: N/A;  . CARDIAC CATHETERIZATION N/A 04/15/2016   Procedure: Right/Left Heart Cath and Coronary Angiography;  Surgeon: Jolaine Artist, MD;  Location: [facility];  Service: Cardiovascular;  Laterality: N/A;  . COLONOSCOPY N/A 12/08/2013   Procedure: COLONOSCOPY;  Surgeon: Milus Banister, MD;  Location: [facility];  Service: Endoscopy;  Laterality: N/A;  . ESOPHAGOGASTRODUODENOSCOPY (EGD) WITH PROPOFOL N/A 03/27/2016  Procedure: ESOPHAGOGASTRODUODENOSCOPY (EGD) WITH PROPOFOL;  Surgeon: Milus Banister, MD;  Location: [facility];  Service: Endoscopy;  Laterality: N/A;  . LOOP RECORDER INSERTION N/A 01/01/2017   Procedure: Loop Recorder Insertion;  Surgeon: Thompson Grayer, MD;  Location: [facility];  Service: Cardiovascular;  Laterality: N/A;  . RIGHT HEART  CATHETERIZATION Right 06/06/2014   Procedure: RIGHT HEART CATH;  Surgeon: Jolaine Artist, MD;  Location: [facility] CATH LAB;  Service: Cardiovascular;  Laterality: Right;  . UVULOPALATOPHARYNGOPLASTY  1999        Home Medications    Prior to Admission medications   Medication Sig Start Date End Date Taking? Authorizing Provider  Albuterol Sulfate (PROAIR RESPICLICK) 937 (90 Base) MCG/ACT AEPB Inhale 2 puffs into the lungs every 6 (six) hours as needed (SOB or wheezing). 09/10/18   Mosie Lukes, MD  allopurinol (ZYLOPRIM) 100 MG tablet Take 2 tablets (200 mg total) by mouth daily. 05/04/18   Mosie Lukes, MD  azelastine (ASTELIN) 0.1 % nasal spray Place 2 sprays into both nostrils 2 (two) times daily. Use in each nostril as directed 10/04/18   Saguier, Percell Miller, PA-C  cholestyramine (QUESTRAN) 4 g packet Take 1 packet (4 g total) by mouth 2 (two) times daily. 05/04/18   Mosie Lukes, MD  colchicine 0.6 MG tablet Take 1 tablet (0.6 mg total) by mouth 2 (two) times daily as needed. 09/27/18   Mosie Lukes, MD  diphenoxylate-atropine (LOMOTIL) 2.5-0.025 MG tablet Take 1 tablet by mouth 3 (three) times daily. 08/26/18   Mosie Lukes, MD  fluticasone (FLONASE) 50 MCG/ACT nasal spray Place 2 sprays into both nostrils daily. 09/27/18   Saguier, Percell Miller, PA-C  fluticasone-salmeterol (ADVAIR HFA) 230-21 MCG/ACT inhaler Inhale 2 puffs into the lungs every morning. 09/10/18   Mosie Lukes, MD  furosemide (LASIX) 40 MG tablet Take 1 tablet (40 mg total) by mouth 3 (three) times a week. Every Mon, Emmie Niemann and Friday 04/21/18   Bensimhon, Shaune Pascal, MD  hyoscyamine (LEVSIN SL) 0.125 MG SL tablet Place 1 tablet (0.125 mg total) under the tongue every 6 (six) hours as needed. 09/24/18   Mosie Lukes, MD  ipratropium (ATROVENT HFA) 17 MCG/ACT inhaler Inhale 2 puffs into the lungs every 6 (six) hours as needed for wheezing. 12/14/17   Saguier, Percell Miller, PA-C  loperamide (IMODIUM) 2 MG capsule Take 2 mg by mouth as  needed for diarrhea or loose stools.    [provider]  losartan (COZAAR) 50 MG tablet TAKE 1 TABLET DAILY 09/13/18   Larey Dresser, MD  macitentan (OPSUMIT) 10 MG tablet Take 1 tablet (10 mg total) by mouth daily. 11/19/18   Bensimhon, Shaune Pascal, MD  omeprazole (PRILOSEC) 40 MG capsule Take 1 capsule (40 mg total) by mouth daily. 09/27/18   Saguier, Percell Miller, PA-C  potassium chloride (K-DUR) 10 MEQ tablet Take 2 tablets (20 mEq total) by mouth daily. 09/27/18   Saguier, Percell Miller, PA-C  rosuvastatin (CRESTOR) 5 MG tablet Take 1 tablet (5 mg total) by mouth daily. 09/27/18   Saguier, Percell Miller, PA-C  Selexipag (UPTRAVI) 1000 MCG TABS Take 1,000 mcg by mouth 2 (two) times daily. 09/24/18   Bensimhon, Shaune Pascal, MD  spironolactone (ALDACTONE) 50 MG tablet TAKE 1 TABLET DAILY 02/27/17   Mosie Lukes, MD  tadalafil, PAH, (ADCIRCA) 20 MG tablet TAKE 2 TABLETS (40 MG TOTAL) DAILY 07/19/18   Bensimhon, Shaune Pascal, MD  tiZANidine (ZANAFLEX) 2 MG tablet Take 0.5-2 tablets (1-4 mg total)  by mouth every 6 (six) hours as needed for muscle spasms. 05/04/18   Mosie Lukes, MD    Family History Family History  Problem Relation Age of Onset  . Heart disease Mother   . Heart attack Mother   . Hypertension Mother   . Kidney failure Mother   . Liver disease Mother        stage 4 liver disease  . Prostate cancer Father   . Cancer Father        lung  . Hypertension Brother   . Gout Brother   . Alcoholism Maternal Grandfather   . Liver disease Maternal Grandfather   . Migraines Sister   . Hypertension Brother   . Colon cancer Neg Hx     Social History Social History   Tobacco Use  . Smoking status: Former Smoker    Packs/day: 2.00    Years: 5.00    Pack years: 10.00    Types: Cigarettes    Last attempt to quit: 09/22/1979    Years since quitting: 39.4  . Smokeless tobacco: Never Used  Substance Use Topics  . Alcohol use: Yes    Alcohol/week: 6.0 standard drinks    Types: 6 Shots of liquor  per week    Comment: social drinker  . Drug use: No     Allergies   Aspirin and Nitroglycerin   Review of Systems Review of Systems  Respiratory: Positive for shortness of breath.   Cardiovascular: Positive for chest pain and leg swelling.  Gastrointestinal: Positive for anal bleeding.  All other systems reviewed and are negative.    Physical Exam Updated Vital Signs BP (!) 144/86   Pulse 89   Resp 16   Ht 6' (1.829 m)   Wt 96.6 kg   SpO2 100%   BMI 28.89 kg/m   Physical Exam Vitals signs and nursing note reviewed.  Constitutional:      General: He is not in acute distress.    Appearance: Normal appearance. He is well-developed.  HENT:     Head: Normocephalic and atraumatic.     Right Ear: Hearing normal.     Left Ear: Hearing normal.     Nose: Nose normal.  Eyes:     Conjunctiva/sclera: Conjunctivae normal.     Pupils: Pupils are equal, round, and reactive to light.  Neck:     Musculoskeletal: Normal range of motion and neck supple.  Cardiovascular:     Rate and Rhythm: Regular rhythm.     Heart sounds: S1 normal and S2 normal. No murmur. No friction rub. No gallop.   Pulmonary:     Effort: Pulmonary effort is normal. No respiratory distress.     Breath sounds: Normal breath sounds.  Chest:     Chest wall: No tenderness.  Abdominal:     General: Bowel sounds are normal. There is distension.     Palpations: Abdomen is soft.     Tenderness: There is no abdominal tenderness. There is no guarding or rebound. Negative signs include Murphy's sign and McBurney's sign.     Hernia: No hernia is present.  Musculoskeletal: Normal range of motion.     Right lower leg: Edema present.     Left lower leg: Edema present.  Skin:    General: Skin is warm and dry.     Findings: No rash.  Neurological:     Mental Status: He is alert and oriented to person, place, and time.     GCS: GCS eye  subscore is 4. GCS verbal subscore is 5. GCS motor subscore is 6.     Cranial  Nerves: No cranial nerve deficit.     Sensory: No sensory deficit.     Coordination: Coordination normal.  Psychiatric:        Speech: Speech normal.        Behavior: Behavior normal.        Thought Content: Thought content normal.      ED Treatments / Results  Labs (all labs ordered are listed, but only abnormal results are displayed) Labs Reviewed  CBC WITH DIFFERENTIAL/PLATELET - Abnormal; Notable for the following components:      Result Value   WBC 3.4 (*)    RBC 2.58 (*)    Hemoglobin 9.1 (*)    HCT 29.1 (*)    MCV 112.8 (*)    MCH 35.3 (*)    RDW 15.7 (*)    Platelets 35 (*)    Lymphs Abs 0.6 (*)    All other components within normal limits  BASIC METABOLIC PANEL - Abnormal; Notable for the following components:   CO2 19 (*)    BUN 38 (*)    Creatinine, Ser 1.78 (*)    Calcium 8.4 (*)    GFR calc non Af Amer 38 (*)    GFR calc Af Amer 44 (*)    All other components within normal limits  HEPATIC FUNCTION PANEL - Abnormal; Notable for the following components:   Total Protein 6.0 (*)    Albumin 2.9 (*)    AST 55 (*)    Alkaline Phosphatase 244 (*)    Total Bilirubin 1.8 (*)    Bilirubin, Direct 0.6 (*)    Indirect Bilirubin 1.2 (*)    All other components within normal limits  BRAIN NATRIURETIC PEPTIDE - Abnormal; Notable for the following components:   B Natriuretic Peptide 158.1 (*)    All other components within normal limits  PROTIME-INR - Abnormal; Notable for the following components:   Prothrombin Time 17.1 (*)    INR 1.4 (*)    All other components within normal limits  TROPONIN I  ETHANOL  AMMONIA    EKG None  ED ECG REPORT   Date: 02/13/2019  Rate: 91  Rhythm: normal sinus rhythm  QRS Axis: right  Intervals: normal  ST/T Wave abnormalities: ST depressions anteriorly  Conduction Disutrbances:none  Narrative Interpretation:   Old EKG Reviewed: unchanged  I have personally reviewed the EKG tracing and agree with the computerized  printout as noted.   Radiology Dg Chest Port 1 View  Result Date: 02/13/2019 CLINICAL DATA:  Initial evaluation for acute chest pain, shortness of breath. EXAM: PORTABLE CHEST 1 VIEW COMPARISON:  Prior radiograph from 09/27/2018. FINDINGS: Cardiomegaly, stable. Loop recorder overlies the left heart, also unchanged. Mediastinal silhouette within normal limits. Lungs normally inflated. Mild perihilar vascular congestion without overt pulmonary edema. No definite pleural effusion. No consolidative opacity. No pneumothorax. No acute osseous finding. IMPRESSION: 1. Cardiomegaly with mild perihilar vascular congestion without overt pulmonary edema. 2. No other active cardiopulmonary disease. Electronically Signed   By: Jeannine Boga M.D.   On: 02/13/2019 00:55    Procedures Procedures (including critical care time)  Medications Ordered in ED Medications - No data to display   Initial Impression / Assessment and Plan / ED Course  I have reviewed the triage vital signs and the nursing notes.  Pertinent labs & imaging results that were available during my care of the patient  were reviewed by me and considered in my medical decision making (see chart for details).        Patient presents to the emergency department with multiple complaints.  Patient is here tonight mainly because of rectal bleeding.  Patient denies previous history of similar bleeding.  He passed a large amount of bright red blood per rectum tonight.  No rectal pain, no abdominal pain.  Abdominal exam reveals very slight distention but no tenderness.  No concern for SBP.  Patient does have a history of alcoholic cirrhosis and thrombocytopenia.  Reviewing records reveals last EGD was in 2017, has small varices.  Has never had variceal bleed.  Colonoscopy in 2015 did not show any significant pathology.  Blood work today does show anemia below his baseline, will require monitoring for serial H&H.  Does not require transfusion at  this time.  Patient also complaining of chest pain.  EKG does not show any changes.  He does have depressions in V1 through V3, but this is his baseline and is unchanged.  Troponin negative.  BNP not markedly elevated but he does have a history of right heart failure.  Patient has significant edema of both lower extremities which is unusual for him and is increasing over time consistent with right heart failure.  Will require diuresis.  Based on his need for monitoring for bleeding and diuresis for right heart failure, will require hospitalization.  I did discuss the risk of hospitalization and this time of coronavirus pandemic with the patient.  He does not feel comfortable going home with his current condition and therefore agrees with hospitalization.  Final Clinical Impressions(s) / ED Diagnoses   Final diagnoses:  Acute on chronic right-sided heart failure [facility])  Rectal bleeding    ED Discharge Orders    None       Betsey Holiday Gwenyth Allegra, MD 02/13/19 0136

## 2019-02-12 NOTE — ED Triage Notes (Signed)
Patient BIB EMS from home with c/o bloody stool that started 2 hours ago. Patient has hx of CKD, his nephrologist told him to come to the ED to get checked out. Patient AOx4, NAD.

## 2019-02-13 ENCOUNTER — Other Ambulatory Visit: Payer: Self-pay

## 2019-02-13 ENCOUNTER — Emergency Department (HOSPITAL_COMMUNITY): Payer: Medicare Other

## 2019-02-13 ENCOUNTER — Encounter (HOSPITAL_COMMUNITY): Payer: Self-pay | Admitting: Family Medicine

## 2019-02-13 DIAGNOSIS — F1020 Alcohol dependence, uncomplicated: Secondary | ICD-10-CM

## 2019-02-13 DIAGNOSIS — K625 Hemorrhage of anus and rectum: Secondary | ICD-10-CM | POA: Diagnosis present

## 2019-02-13 DIAGNOSIS — J449 Chronic obstructive pulmonary disease, unspecified: Secondary | ICD-10-CM

## 2019-02-13 DIAGNOSIS — I50813 Acute on chronic right heart failure: Secondary | ICD-10-CM | POA: Diagnosis not present

## 2019-02-13 DIAGNOSIS — G40909 Epilepsy, unspecified, not intractable, without status epilepticus: Secondary | ICD-10-CM | POA: Diagnosis not present

## 2019-02-13 DIAGNOSIS — D61818 Other pancytopenia: Secondary | ICD-10-CM

## 2019-02-13 DIAGNOSIS — R0789 Other chest pain: Secondary | ICD-10-CM

## 2019-02-13 DIAGNOSIS — R188 Other ascites: Secondary | ICD-10-CM

## 2019-02-13 DIAGNOSIS — K7030 Alcoholic cirrhosis of liver without ascites: Secondary | ICD-10-CM

## 2019-02-13 DIAGNOSIS — I2721 Secondary pulmonary arterial hypertension: Secondary | ICD-10-CM | POA: Diagnosis not present

## 2019-02-13 DIAGNOSIS — R0602 Shortness of breath: Secondary | ICD-10-CM | POA: Diagnosis not present

## 2019-02-13 DIAGNOSIS — K766 Portal hypertension: Secondary | ICD-10-CM | POA: Diagnosis not present

## 2019-02-13 DIAGNOSIS — K921 Melena: Principal | ICD-10-CM

## 2019-02-13 DIAGNOSIS — K7031 Alcoholic cirrhosis of liver with ascites: Secondary | ICD-10-CM

## 2019-02-13 DIAGNOSIS — R195 Other fecal abnormalities: Secondary | ICD-10-CM

## 2019-02-13 LAB — PROTIME-INR
INR: 1.4 — ABNORMAL HIGH (ref 0.8–1.2)
Prothrombin Time: 17.1 seconds — ABNORMAL HIGH (ref 11.4–15.2)

## 2019-02-13 LAB — CBC WITH DIFFERENTIAL/PLATELET
Abs Immature Granulocytes: 0.01 10*3/uL (ref 0.00–0.07)
Basophils Absolute: 0 10*3/uL (ref 0.0–0.1)
Basophils Relative: 0 %
Eosinophils Absolute: 0 10*3/uL (ref 0.0–0.5)
Eosinophils Relative: 1 %
HCT: 29.1 % — ABNORMAL LOW (ref 39.0–52.0)
Hemoglobin: 9.1 g/dL — ABNORMAL LOW (ref 13.0–17.0)
Immature Granulocytes: 0 %
Lymphocytes Relative: 19 %
Lymphs Abs: 0.6 10*3/uL — ABNORMAL LOW (ref 0.7–4.0)
MCH: 35.3 pg — ABNORMAL HIGH (ref 26.0–34.0)
MCHC: 31.3 g/dL (ref 30.0–36.0)
MCV: 112.8 fL — ABNORMAL HIGH (ref 80.0–100.0)
Monocytes Absolute: 0.5 10*3/uL (ref 0.1–1.0)
Monocytes Relative: 15 %
Neutro Abs: 2.2 10*3/uL (ref 1.7–7.7)
Neutrophils Relative %: 65 %
Platelets: 35 10*3/uL — ABNORMAL LOW (ref 150–400)
RBC: 2.58 MIL/uL — ABNORMAL LOW (ref 4.22–5.81)
RDW: 15.7 % — ABNORMAL HIGH (ref 11.5–15.5)
WBC: 3.4 10*3/uL — ABNORMAL LOW (ref 4.0–10.5)
nRBC: 0 % (ref 0.0–0.2)

## 2019-02-13 LAB — TYPE AND SCREEN
ABO/RH(D): O POS
Antibody Screen: NEGATIVE

## 2019-02-13 LAB — BASIC METABOLIC PANEL
Anion gap: 10 (ref 5–15)
BUN: 38 mg/dL — ABNORMAL HIGH (ref 8–23)
CO2: 19 mmol/L — ABNORMAL LOW (ref 22–32)
Calcium: 8.4 mg/dL — ABNORMAL LOW (ref 8.9–10.3)
Chloride: 109 mmol/L (ref 98–111)
Creatinine, Ser: 1.78 mg/dL — ABNORMAL HIGH (ref 0.61–1.24)
GFR calc Af Amer: 44 mL/min — ABNORMAL LOW (ref >60)
GFR calc non Af Amer: 38 mL/min — ABNORMAL LOW (ref >60)
Glucose, Bld: 99 mg/dL (ref 70–99)
Potassium: 4 mmol/L (ref 3.5–5.1)
Sodium: 138 mmol/L (ref 135–145)

## 2019-02-13 LAB — CBC
HCT: 28.1 % — ABNORMAL LOW (ref 39.0–52.0)
HCT: 24.8 % — ABNORMAL LOW (ref 39.0–52.0)
Hemoglobin: 9 g/dL — ABNORMAL LOW (ref 13.0–17.0)
Hemoglobin: 7.8 g/dL — ABNORMAL LOW (ref 13.0–17.0)
MCH: 34.1 pg — ABNORMAL HIGH (ref 26.0–34.0)
MCH: 34.4 pg — ABNORMAL HIGH (ref 26.0–34.0)
MCHC: 32 g/dL (ref 30.0–36.0)
MCHC: 31.5 g/dL (ref 30.0–36.0)
MCV: 106.4 fL — ABNORMAL HIGH (ref 80.0–100.0)
MCV: 109.3 fL — ABNORMAL HIGH (ref 80.0–100.0)
Platelets: 43 10*3/uL — ABNORMAL LOW (ref 150–400)
Platelets: 39 10*3/uL — ABNORMAL LOW (ref 150–400)
RBC: 2.64 MIL/uL — ABNORMAL LOW (ref 4.22–5.81)
RBC: 2.27 MIL/uL — ABNORMAL LOW (ref 4.22–5.81)
RDW: 15.4 % (ref 11.5–15.5)
RDW: 15.7 % — ABNORMAL HIGH (ref 11.5–15.5)
WBC: 3 10*3/uL — ABNORMAL LOW (ref 4.0–10.5)
WBC: 2 10*3/uL — ABNORMAL LOW (ref 4.0–10.5)
nRBC: 0 % (ref 0.0–0.2)
nRBC: 0 % (ref 0.0–0.2)

## 2019-02-13 LAB — HEPATIC FUNCTION PANEL
ALT: 22 U/L (ref 0–44)
AST: 55 U/L — ABNORMAL HIGH (ref 15–41)
Albumin: 2.9 g/dL — ABNORMAL LOW (ref 3.5–5.0)
Alkaline Phosphatase: 244 U/L — ABNORMAL HIGH (ref 38–126)
Bilirubin, Direct: 0.6 mg/dL — ABNORMAL HIGH (ref 0.0–0.2)
Indirect Bilirubin: 1.2 mg/dL — ABNORMAL HIGH (ref 0.3–0.9)
Total Bilirubin: 1.8 mg/dL — ABNORMAL HIGH (ref 0.3–1.2)
Total Protein: 6 g/dL — ABNORMAL LOW (ref 6.5–8.1)

## 2019-02-13 LAB — AMMONIA: Ammonia: 32 umol/L (ref 9–35)

## 2019-02-13 LAB — COMPREHENSIVE METABOLIC PANEL
ALT: 23 U/L (ref 0–44)
AST: 53 U/L — ABNORMAL HIGH (ref 15–41)
Albumin: 2.9 g/dL — ABNORMAL LOW (ref 3.5–5.0)
Alkaline Phosphatase: 226 U/L — ABNORMAL HIGH (ref 38–126)
Anion gap: 14 (ref 5–15)
BUN: 42 mg/dL — ABNORMAL HIGH (ref 8–23)
CO2: 19 mmol/L — ABNORMAL LOW (ref 22–32)
Calcium: 8.6 mg/dL — ABNORMAL LOW (ref 8.9–10.3)
Chloride: 107 mmol/L (ref 98–111)
Creatinine, Ser: 1.74 mg/dL — ABNORMAL HIGH (ref 0.61–1.24)
GFR calc Af Amer: 45 mL/min — ABNORMAL LOW (ref >60)
GFR calc non Af Amer: 39 mL/min — ABNORMAL LOW (ref >60)
Glucose, Bld: 75 mg/dL (ref 70–99)
Potassium: 3.8 mmol/L (ref 3.5–5.1)
Sodium: 140 mmol/L (ref 135–145)
Total Bilirubin: 1.8 mg/dL — ABNORMAL HIGH (ref 0.3–1.2)
Total Protein: 6.2 g/dL — ABNORMAL LOW (ref 6.5–8.1)

## 2019-02-13 LAB — ETHANOL: Alcohol, Ethyl (B): <10 mg/dL (ref <10)

## 2019-02-13 LAB — BRAIN NATRIURETIC PEPTIDE: B Natriuretic Peptide: 158.1 pg/mL — ABNORMAL HIGH (ref 0.0–100.0)

## 2019-02-13 LAB — HIV ANTIBODY (ROUTINE TESTING W REFLEX): HIV Screen 4th Generation wRfx: NONREACTIVE

## 2019-02-13 LAB — ABO/RH: ABO/RH(D): O POS

## 2019-02-13 LAB — MRSA PCR SCREENING: MRSA by PCR: NEGATIVE

## 2019-02-13 LAB — TROPONIN I
Troponin I: 0.03 ng/mL (ref <0.03)
Troponin I: 0.04 ng/mL (ref <0.03)
Troponin I: <0.03 ng/mL (ref <0.03)

## 2019-02-13 LAB — MAGNESIUM: Magnesium: 1.6 mg/dL — ABNORMAL LOW (ref 1.7–2.4)

## 2019-02-13 MED ORDER — FLUTICASONE FUROATE-VILANTEROL 200-25 MCG/INH IN AEPB
1.0000 | INHALATION_SPRAY | Freq: Every day | RESPIRATORY_TRACT | Status: DC
  Administered 2019-02-13: 1 via RESPIRATORY_TRACT
  Filled 2019-02-13: qty 28

## 2019-02-13 MED ORDER — ONDANSETRON HCL 4 MG/2ML IJ SOLN
4.0000 mg | Freq: Four times a day (QID) | INTRAMUSCULAR | Status: DC | PRN
  Administered 2019-02-13: 4 mg via INTRAVENOUS
  Filled 2019-02-13: qty 2

## 2019-02-13 MED ORDER — GATIFLOXACIN 0.5 % OP SOLN
1.0000 [drp] | Freq: Every day | OPHTHALMIC | Status: DC
  Administered 2019-02-13 – 2019-02-14 (×2): 1 [drp] via OPHTHALMIC
  Filled 2019-02-13: qty 2.5

## 2019-02-13 MED ORDER — SODIUM CHLORIDE 0.9 % IV SOLN: 250.0000 mL | INTRAVENOUS | Status: DC | PRN

## 2019-02-13 MED ORDER — PANTOPRAZOLE SODIUM 40 MG IV SOLR
40.0000 mg | Freq: Two times a day (BID) | INTRAVENOUS | Status: DC
  Administered 2019-02-13 (×3): 40 mg via INTRAVENOUS
  Filled 2019-02-13 (×3): qty 40

## 2019-02-13 MED ORDER — FUROSEMIDE 10 MG/ML IJ SOLN: 40.0000 mg | Freq: Every day | INTRAMUSCULAR | Status: DC

## 2019-02-13 MED ORDER — SODIUM CHLORIDE 0.9 % IV SOLN
50.0000 ug/h | INTRAVENOUS | Status: DC
  Administered 2019-02-13 – 2019-02-14 (×3): 50 ug/h via INTRAVENOUS
  Filled 2019-02-13 (×5): qty 1

## 2019-02-13 MED ORDER — SODIUM CHLORIDE 0.9% FLUSH: 3.0000 mL | INTRAVENOUS | Status: DC | PRN

## 2019-02-13 MED ORDER — ADULT MULTIVITAMIN W/MINERALS CH
1.0000 | ORAL_TABLET | Freq: Every day | ORAL | Status: DC
  Administered 2019-02-13 – 2019-02-14 (×2): 1 via ORAL
  Filled 2019-02-13 (×2): qty 1

## 2019-02-13 MED ORDER — LORAZEPAM 2 MG/ML IJ SOLN: 0.0000 mg | Freq: Four times a day (QID) | INTRAMUSCULAR | Status: DC

## 2019-02-13 MED ORDER — ALBUTEROL SULFATE (2.5 MG/3ML) 0.083% IN NEBU: 2.5000 mg | INHALATION_SOLUTION | Freq: Four times a day (QID) | RESPIRATORY_TRACT | Status: DC | PRN

## 2019-02-13 MED ORDER — THIAMINE HCL 100 MG/ML IJ SOLN
100.0000 mg | Freq: Every day | INTRAMUSCULAR | Status: DC
  Administered 2019-02-13: 100 mg via INTRAVENOUS
  Filled 2019-02-13: qty 2

## 2019-02-13 MED ORDER — LORAZEPAM 2 MG/ML IJ SOLN: 1.0000 mg | Freq: Four times a day (QID) | INTRAMUSCULAR | Status: DC | PRN

## 2019-02-13 MED ORDER — ACETAMINOPHEN 325 MG PO TABS
650.0000 mg | ORAL_TABLET | ORAL | Status: DC | PRN
  Administered 2019-02-13: 650 mg via ORAL
  Filled 2019-02-13: qty 2

## 2019-02-13 MED ORDER — FUROSEMIDE 10 MG/ML IJ SOLN
40.0000 mg | Freq: Two times a day (BID) | INTRAMUSCULAR | Status: DC
  Administered 2019-02-13 (×2): 40 mg via INTRAVENOUS
  Filled 2019-02-13 (×2): qty 4

## 2019-02-13 MED ORDER — IPRATROPIUM BROMIDE 0.02 % IN SOLN: 0.5000 mg | Freq: Four times a day (QID) | RESPIRATORY_TRACT | Status: DC | PRN

## 2019-02-13 MED ORDER — MAGNESIUM SULFATE 2 GM/50ML IV SOLN
2.0000 g | Freq: Once | INTRAVENOUS | Status: AC
  Administered 2019-02-13: 2 g via INTRAVENOUS
  Filled 2019-02-13: qty 50

## 2019-02-13 MED ORDER — SODIUM CHLORIDE 0.9% FLUSH
3.0000 mL | Freq: Two times a day (BID) | INTRAVENOUS | Status: DC
  Administered 2019-02-13 (×3): 3 mL via INTRAVENOUS

## 2019-02-13 MED ORDER — VITAMIN B-1 100 MG PO TABS
100.0000 mg | ORAL_TABLET | Freq: Every day | ORAL | Status: DC
  Administered 2019-02-14: 100 mg via ORAL
  Filled 2019-02-13: qty 1

## 2019-02-13 MED ORDER — PREDNISOLONE ACETATE 1 % OP SUSP
1.0000 [drp] | Freq: Every day | OPHTHALMIC | Status: DC
  Administered 2019-02-13 – 2019-02-14 (×2): 1 [drp] via OPHTHALMIC
  Filled 2019-02-13: qty 5

## 2019-02-13 MED ORDER — SODIUM CHLORIDE 0.9 % IV SOLN
1.0000 g | INTRAVENOUS | Status: DC
  Administered 2019-02-13: 1 g via INTRAVENOUS
  Filled 2019-02-13: qty 10

## 2019-02-13 MED ORDER — LORAZEPAM 2 MG/ML IJ SOLN: 0.0000 mg | Freq: Two times a day (BID) | INTRAMUSCULAR | Status: DC

## 2019-02-13 MED ORDER — LORAZEPAM 1 MG PO TABS: 1.0000 mg | ORAL_TABLET | Freq: Four times a day (QID) | ORAL | Status: DC | PRN

## 2019-02-13 MED ORDER — FOLIC ACID 1 MG PO TABS
1.0000 mg | ORAL_TABLET | Freq: Every day | ORAL | Status: DC
  Administered 2019-02-13 – 2019-02-14 (×2): 1 mg via ORAL
  Filled 2019-02-13 (×2): qty 1

## 2019-02-13 NOTE — ED Notes (Signed)
PXR in room.

## 2019-02-13 NOTE — Progress Notes (Addendum)
PROGRESS NOTE    Darren Mueller  TWS:[id number] DOB: Oct 23, 1951 DOA: 02/12/2019 PCP: Mosie Lukes, MD   Brief Narrative: Patient is a 68 year old male with past medical history of alcoholic liver disease with ascites, pulmonary arterial hypertension, CKD stage III, chronic pancytopenia, hypertension, history of seizures, chronic alcohol abuse who presents to the emergency department after an episode of rectal bleeding.  Patient also reported of having black tarry stools last week.  Also had some abdominal discomfort.  Patient also reported that he has gained his weight significantly , developed bilateral lower extremity swelling and complained of mild dyspnea.  Had an episode of chest pain in the emergency department also. Patient admitted for the management of  GI bleed.  GI consulted today.  Assessment & Plan:   Principal Problem:   Rectal bleeding Active Problems:   Hypertension   Hepatic cirrhosis (HCC)   PAH (pulmonary arterial hypertension) with portal hypertension (HCC)   Hypothyroidism   Right heart failure due to pulmonary hypertension (HCC)   Chronic renal insufficiency, stage III (moderate) (HCC)   Pancytopenia (HCC)   Cirrhosis with alcoholism (HCC)   Chest pain   Thrombocytopenia (HCC)   Seizure disorder (HCC)   COPD (chronic obstructive pulmonary disease) (HCC)   Acute on chronic right heart failure (HCC)   Hematochezia: Had an episode of frank rectal bleeding after having melena last week.  Cud be upper GI bleed associated with esophageal varices.  Hemodynamically stable on presentation.  Hemoglobin of 9 on presentation.  His hemoglobin was 11.1 in January of this year.  Platelets of 35,000.  He had done EGD in 2017 which showed small esophageal varices and colonoscopy in 2015 showed a polyp that was removed. Continue IV PPI and octreotide.  GI consulted.  Currently on clear liquid diet.  No bowel movement since yesterday.  Pancytopenia: Presented with  thrombocytopenia.  Most likely associated with advanced cirrhosis.  He was transfused with platelets on admission.  Alcoholic cirrhosis: Secondary to ethanol abuse.  Patient also has history of severe right ventricular failure and pulmonary hypertension.  History of small esophageal varices as per EGD in May 2017.  INR of 1.4 on presentation, total bilirubin of 1.8.  Meld score of 18.  Ascites: Abdomen is distended and ascitic.  Will request for ultrasound-guided paracentesis.  Will transfuse with albumin if removed more than 5 L.  Chronic alcohol abuse: He still drinks but says he has significantly cut down.  He drinks 1 glass of vodka daily.  Counseled for cessation.  Monitor for withdrawal.  Continue thiamine, folic acid.  Acute on chronic right ventricular failure/pulmonary hypertension: Presents with recent worsening of shortness and swelling of bilateral lower extremities.  Follows with Dr. Haroldine Laws, cardiology.  Started on Lasix IV 40 mg every 12.  We will continue to monitor input/output.  Fluid restriction.  Significant improvement in lower extremity edema today.  Chest x-ray on presentation showed cardiomegaly with vascular congestion.  Chest pain: Chest pain-free at present but complained of chest pain on presentation.  Mildly elevated troponin.  Most likely this is associated with his heart failure.  EKG did not show any ischemic changes.  COPD: Currently stable.  No cough or wheezing.  Continue home inhalers  CKD stage III: Currently kidney function is at baseline.  History of seizures: Not on Keppra anymore.          DVT prophylaxis: SCD Code Status: Full Family Communication: None present at the bedside Disposition Plan: Home after full work-up  Consultants: GI  Procedures: None  Antimicrobials:  Anti-infectives (From admission, onward)   None      Subjective: Patient seen and examined at the bedside this morning.  Hemodynamically stable.  Looks comfortable.   No more bloody bowel movement since admission.  Denies any abdominal pain.  No chest pain.  Objective: Vitals:   02/13/19 0630 02/13/19 0717 02/13/19 0800 02/13/19 0803  BP: 109/70 111/63 109/70   Pulse: 82 91 91   Resp: _0 Temp:  98.1 F (36.7 C) 98 F (36.7 C)   TempSrc:  Oral Oral   SpO2:  99% 99% 99%  Weight:      Height:        Intake/Output Summary (Last 24 hours) at 02/13/2019 0923 Last data filed at 02/13/2019 2563 Gross per 24 hour  Intake 389 ml  Output 325 ml  Net 64 ml   Filed Weights   02/12/19 2345 02/13/19 0311  Weight: 96.6 kg 91.1 kg    Examination:  General exam: Appears calm and comfortable ,Not in distress,average built HEENT:PERRL,Oral mucosa moist, Ear/Nose normal on gross exam Respiratory system: Bilateral basal crackles  Cardiovascular system: S1 & S2 heard, RRR. No JVD, murmurs, rubs, gallops or clicks.1-2 +pedal edema. Gastrointestinal system: Abdomen is distended, soft .Ascites. No organomegaly or masses felt. Normal bowel sounds heard. Central nervous system: Alert and oriented. No focal neurological deficits. Extremities: 1-2 + edema, no clubbing ,no cyanosis, distal peripheral pulses palpable. Skin: No rashes, lesions or ulcers,no icterus ,no pallor MSK: Normal muscle bulk,tone ,power Psychiatry: Judgement and insight appear normal. Mood & affect appropriate.     Data Reviewed: I have personally reviewed following labs and imaging studies  CBC: Recent Labs  Lab 02/12/19 2356 02/13/19 0507  WBC 3.4* 3.0*  NEUTROABS 2.2  --   HGB 9.1* 9.0*  HCT 29.1* 28.1*  MCV 112.8* 106.4*  PLT 35* 43*   Basic Metabolic Panel: Recent Labs  Lab 02/12/19 2356 02/13/19 0507  NA 138 140  K 4.0 3.8  CL 109 107  CO2 19* 19*  GLUCOSE 99 75  BUN 38* 42*  CREATININE 1.78* 1.74*  CALCIUM 8.4* 8.6*  MG  --  1.6*   GFR: Estimated Creatinine Clearance: 44 mL/min (A) (by C-G formula based on SCr of 1.74 mg/dL (H)). Liver Function Tests:  Recent Labs  Lab 02/12/19 2356 02/13/19 0507  AST 55* 53*  ALT 22 23  ALKPHOS 244* 226*  BILITOT 1.8* 1.8*  PROT 6.0* 6.2*  ALBUMIN 2.9* 2.9*   No results for input(s): LIPASE, AMYLASE in the last 168 hours. Recent Labs  Lab 02/12/19 2356  AMMONIA 32   Coagulation Profile: Recent Labs  Lab 02/12/19 2356  INR 1.4*   Cardiac Enzymes: Recent Labs  Lab 02/12/19 2356 02/13/19 0507  TROPONINI <0.03 0.04*   BNP (last 3 results) Recent Labs    09/22/18 1405 09/27/18 0913  PROBNP 859.0* 415.0*   HbA1C: No results for input(s): HGBA1C in the last 72 hours. CBG: No results for input(s): GLUCAP in the last 168 hours. Lipid Profile: No results for input(s): CHOL, HDL, LDLCALC, TRIG, CHOLHDL, LDLDIRECT in the last 72 hours. Thyroid Function Tests: No results for input(s): TSH, T4TOTAL, FREET4, T3FREE, THYROIDAB in the last 72 hours. Anemia Panel: No results for input(s): VITAMINB12, FOLATE, FERRITIN, TIBC, IRON, RETICCTPCT in the last 72 hours. Sepsis Labs: No results for input(s): PROCALCITON, LATICACIDVEN in the last 168 hours.  Recent Results (from the past 240 hour(s))  MRSA PCR Screening     Status: None   Collection Time: 02/13/19  3:14 AM  Result Value Ref Range Status   MRSA by PCR NEGATIVE NEGATIVE Final    Comment:        The GeneXpert MRSA Assay (FDA approved for NASAL specimens only), is one component of a comprehensive MRSA colonization surveillance program. It is not intended to diagnose MRSA infection nor to guide or monitor treatment for MRSA infections. Performed at [facility], [place] [address], [place],  [address]          Radiology Studies: Dg Chest Port 1 View  Result Date: 02/13/2019 CLINICAL DATA:  Initial evaluation for acute chest pain, shortness of breath. EXAM: PORTABLE CHEST 1 VIEW COMPARISON:  Prior radiograph from 09/27/2018. FINDINGS: Cardiomegaly, stable. Loop recorder overlies the left heart, also unchanged.  Mediastinal silhouette within normal limits. Lungs normally inflated. Mild perihilar vascular congestion without overt pulmonary edema. No definite pleural effusion. No consolidative opacity. No pneumothorax. No acute osseous finding. IMPRESSION: 1. Cardiomegaly with mild perihilar vascular congestion without overt pulmonary edema. 2. No other active cardiopulmonary disease. Electronically Signed   By: Jeannine Boga M.D.   On: 02/13/2019 00:55        Scheduled Meds: . fluticasone furoate-vilanterol  1 puff Inhalation Daily  . folic acid  1 mg Oral Daily  . furosemide  40 mg Intravenous BID  . gatifloxacin  1 drop Right Eye Daily  . LORazepam  0-4 mg Intravenous Q6H   Followed by  . [START ON 02/15/2019] LORazepam  0-4 mg Intravenous Q12H  . multivitamin with minerals  1 tablet Oral Daily  . pantoprazole (PROTONIX) IV  40 mg Intravenous Q12H  . prednisoLONE acetate  1 drop Right Eye Daily  . sodium chloride flush  3 mL Intravenous Q12H  . thiamine  100 mg Oral Daily   Or  . thiamine  100 mg Intravenous Daily   Continuous Infusions: . sodium chloride    . magnesium sulfate 1 - 4 g bolus IVPB 2 g (02/13/19 0828)  . octreotide  (SANDOSTATIN)    IV infusion 50 mcg/hr (02/13/19 0417)     LOS: 0 days    Time spent: 35 mins.More than 50% of that time was spent in counseling and/or coordination of care.      Shelly Coss, MD [facility] Pager [phone]  If 7PM-7AM, please contact night-coverage [email or web address] Password [facility] 02/13/2019, 9:23 AM

## 2019-02-13 NOTE — ED Notes (Signed)
ED TO INPATIENT HANDOFF REPORT  ED Nurse Name and Phone #: Marla Roe Name/Age/Gender Arnette Norris 68 y.o. male Room/Bed: 023C/023C  Code Status   Code Status: Full Code  Home/SNF/Other Home Patient oriented to: self, place, time and situation Is this baseline? Yes   Triage Complete: Triage complete  Chief Complaint Blood in Stool  Triage Note Patient BIB EMS from home with c/o bloody stool that started 2 hours ago. Patient has hx of CKD, his nephrologist told him to come to the ED to get checked out. Patient AOx4, NAD.    Allergies Allergies  Allergen Reactions  . Aspirin Other (See Comments)    hypertension  . Nitroglycerin     Headache    Level of Care/Admitting Diagnosis ED Disposition    ED Disposition Condition [facility]: [facility] [[id number]]  Level of Care: Progressive [102]  I expect the patient will be discharged within 24 hours: Yes  LOW acuity---Tx typically complete <24 hrs---ACUTE conditions typically can be evaluated <24 hours---LABS likely to return to acceptable levels <24 hours---IS near functional baseline---EXPECTED to return to current living arrangement---NOT newly hypoxic: Does not meet criteria for 5C-Observation unit  Diagnosis: Rectal bleeding [217577]  Admitting Physician: Vianne Bulls [[id number]]  Attending Physician: Vianne Bulls [[id number]]  PT Class (Do Not Modify): Observation [104]  PT Acc Code (Do Not Modify): Observation [10022]       B Medical/Surgery History Past Medical History:  Diagnosis Date  . Alcohol dependence ([place]) 03/22/2011   In remission   . Alcohol dependence in remission ([place]) 03/22/2011   In remission   . Alcoholic cirrhosis of liver with ascites ([place]) 2014  . Dermatitis 06/10/2015  . Diarrhea 09/04/2013  . Elbow pain, left 03/17/2017  . GERD (gastroesophageal reflux disease)   . Gout   . Heart murmur   . Hx of colonic polyps   . Hypertension   .  Hypertriglyceridemia 03/17/2017  . Hypopotassemia   . Otalgia 11/28/2013  . Other chronic pulmonary heart diseases   . Preventative health care 06/29/2016  . Red eye 03/03/2016   right  . Renal insufficiency 07/18/2013  . Seizures ([place])   . Sleep apnea    does not wear CPAP  . Thrombocytopenia ([place]) 06/29/2016  . Unspecified hypothyroidism 01/25/2013  . Unspecified pleural effusion    Past Surgical History:  Procedure Laterality Date  . CARDIAC CATHETERIZATION N/A 12/21/2015   Procedure: Right Heart Cath;  Surgeon: Jolaine Artist, MD;  Location: [place] [facility];  Service: Cardiovascular;  Laterality: N/A;  . CARDIAC CATHETERIZATION N/A 04/15/2016   Procedure: Right/Left Heart Cath and Coronary Angiography;  Surgeon: Jolaine Artist, MD;  Location: [place] [facility];  Service: Cardiovascular;  Laterality: N/A;  . COLONOSCOPY N/A 12/08/2013   Procedure: COLONOSCOPY;  Surgeon: Milus Banister, MD;  Location: [facility];  Service: Endoscopy;  Laterality: N/A;  . ESOPHAGOGASTRODUODENOSCOPY (EGD) WITH PROPOFOL N/A 03/27/2016   Procedure: ESOPHAGOGASTRODUODENOSCOPY (EGD) WITH PROPOFOL;  Surgeon: Milus Banister, MD;  Location: [place];  Service: Endoscopy;  Laterality: N/A;  . LOOP RECORDER INSERTION N/A 01/01/2017   Procedure: Loop Recorder Insertion;  Surgeon: Thompson Grayer, MD;  Location: [place] [facility];  Service: Cardiovascular;  Laterality: N/A;  . RIGHT HEART CATHETERIZATION Right 06/06/2014   Procedure: RIGHT HEART CATH;  Surgeon: Jolaine Artist, MD;  Location: [facility] CATH LAB;  Service: Cardiovascular;  Laterality: Right;  . UVULOPALATOPHARYNGOPLASTY  1999  A IV Location/Drains/Wounds Patient Lines/Drains/Airways Status   Active Line/Drains/Airways    Name:   Placement date:   Placement time:   Site:   Days:   Peripheral IV 02/12/19 Right Antecubital   02/12/19    2344    Antecubital   1          Intake/Output Last 24 hours No intake or output data in the 24 hours  ending 02/13/19 0221  Labs/Imaging Results for orders placed or performed during the hospital encounter of 02/12/19 (from the past 48 hour(s))  CBC with Differential/Platelet     Status: Abnormal   Collection Time: 02/12/19 11:56 PM  Result Value Ref Range   WBC 3.4 (L) 4.0 - 10.5 K/uL   RBC 2.58 (L) 4.22 - 5.81 MIL/uL   Hemoglobin 9.1 (L) 13.0 - 17.0 g/dL   HCT 29.1 (L) 39.0 - 52.0 %   MCV 112.8 (H) 80.0 - 100.0 fL   MCH 35.3 (H) 26.0 - 34.0 pg   MCHC 31.3 30.0 - 36.0 g/dL   RDW 15.7 (H) 11.5 - 15.5 %   Platelets 35 (L) 150 - 400 K/uL    Comment: REPEATED TO VERIFY PLATELET COUNT CONFIRMED BY SMEAR SPECIMEN CHECKED FOR CLOTS Immature Platelet Fraction may be clinically indicated, consider ordering this additional test [id number]    nRBC 0.0 0.0 - 0.2 %   Neutrophils Relative % 65 %   Neutro Abs 2.2 1.7 - 7.7 K/uL   Lymphocytes Relative 19 %   Lymphs Abs 0.6 (L) 0.7 - 4.0 K/uL   Monocytes Relative 15 %   Monocytes Absolute 0.5 0.1 - 1.0 K/uL   Eosinophils Relative 1 %   Eosinophils Absolute 0.0 0.0 - 0.5 K/uL   Basophils Relative 0 %   Basophils Absolute 0.0 0.0 - 0.1 K/uL   RBC Morphology MORPHOLOGY UNREMARKABLE    Immature Granulocytes 0 %   Abs Immature Granulocytes 0.01 0.00 - 0.07 K/uL    Comment: Performed at [facility], [address] [place], [place] [address]  Basic metabolic panel     Status: Abnormal   Collection Time: 02/12/19 11:56 PM  Result Value Ref Range   Sodium 138 135 - 145 mmol/L   Potassium 4.0 3.5 - 5.1 mmol/L   Chloride 109 98 - 111 mmol/L   CO2 19 (L) 22 - 32 mmol/L   Glucose, Bld 99 70 - 99 mg/dL   BUN 38 (H) 8 - 23 mg/dL   Creatinine, Ser 1.78 (H) 0.61 - 1.24 mg/dL   Calcium 8.4 (L) 8.9 - 10.3 mg/dL   GFR calc non Af Amer 38 (L) >60 mL/min   GFR calc Af Amer 44 (L) >60 mL/min   Anion gap 10 5 - 15    Comment: Performed at [facility] [address] [place], [place] [address]  Hepatic function panel     Status: Abnormal    Collection Time: 02/12/19 11:56 PM  Result Value Ref Range   Total Protein 6.0 (L) 6.5 - 8.1 g/dL   Albumin 2.9 (L) 3.5 - 5.0 g/dL   AST 55 (H) 15 - 41 U/L   ALT 22 0 - 44 U/L   Alkaline Phosphatase 244 (H) 38 - 126 U/L   Total Bilirubin 1.8 (H) 0.3 - 1.2 mg/dL   Bilirubin, Direct 0.6 (H) 0.0 - 0.2 mg/dL   Indirect Bilirubin 1.2 (H) 0.3 - 0.9 mg/dL    Comment: Performed at [facility]  [address], [place], [place] [address]  Troponin I - ONCE - STAT     Status: None   Collection Time: 02/12/19 11:56 PM  Result Value Ref Range   Troponin I <0.03 <0.03 ng/mL    Comment: Performed at [facility], [place] [address], [place], [place] [address]  Brain natriuretic peptide     Status: Abnormal   Collection Time: 02/12/19 11:56 PM  Result Value Ref Range   B Natriuretic Peptide 158.1 (H) 0.0 - 100.0 pg/mL    Comment: Performed at [facility] [address], [place], [place] [address]  Protime-INR     Status: Abnormal   Collection Time: 02/12/19 11:56 PM  Result Value Ref Range   Prothrombin Time 17.1 (H) 11.4 - 15.2 seconds   INR 1.4 (H) 0.8 - 1.2    Comment: (NOTE) INR goal varies based on device and disease states. Performed at [facility], [place] [address] [place], [place] [address]   Ethanol     Status: None   Collection Time: 02/12/19 11:56 PM  Result Value Ref Range   Alcohol, Ethyl (B) <10 <10 mg/dL    Comment: (NOTE) Lowest detectable limit for serum alcohol is 10 mg/dL. For medical purposes only. Performed at [facility], [place] [address] [place], [place] [address]   Ammonia     Status: None   Collection Time: 02/12/19 11:56 PM  Result Value Ref Range   Ammonia 32 9 - 35 umol/L    Comment: Performed at [facility], [place] [address], [place], [place] [address]   Dg Chest Port 1 View  Result Date: 02/13/2019 CLINICAL DATA:  Initial evaluation for acute chest pain, shortness of breath. EXAM: PORTABLE CHEST 1 VIEW COMPARISON:   Prior radiograph from 09/27/2018. FINDINGS: Cardiomegaly, stable. Loop recorder overlies the left heart, also unchanged. Mediastinal silhouette within normal limits. Lungs normally inflated. Mild perihilar vascular congestion without overt pulmonary edema. No definite pleural effusion. No consolidative opacity. No pneumothorax. No acute osseous finding. IMPRESSION: 1. Cardiomegaly with mild perihilar vascular congestion without overt pulmonary edema. 2. No other active cardiopulmonary disease. Electronically Signed   By: Jeannine Boga M.D.   On: 02/13/2019 00:55    Pending Labs Unresulted Labs (From admission, onward)    Start     Ordered   02/13/19 0500  HIV antibody (Routine Testing)  Tomorrow morning,   R     02/13/19 0214   02/13/19 0500  Comprehensive metabolic panel  Daily,   R     02/13/19 0214   02/13/19 0500  CBC  Now then every 8 hours,   R     02/13/19 0214   02/13/19 0500  Troponin I - Now Then Q6H  Now then every 6 hours,   R     02/13/19 0214   02/13/19 0214  Prepare Pheresed Platelets  (Adult Blood Administration - Platelets (Pheresed))  Once,   R    Question Answer Comment  Number of Apheresis Units 2 units (12-20 packs)   Transfusion Indications Actively Bleeding      02/13/19 0214   02/13/19 0212  Type and screen [place]  Once,   R    Comments:  [place]    02/13/19 0214          Vitals/Pain Today's Vitals   02/12/19 2345 02/13/19 0048 02/13/19 0112 02/13/19 0141  BP:      Pulse:   89  90  Resp:   16 17  SpO2:   100% 100%  Weight: 96.6 kg     Height: 6' (1.829 m)     PainSc:  0-No pain      Isolation Precautions No active isolations  Medications Medications  Albuterol Sulfate AEPB 2 puff (has no administration in time range)  fluticasone furoate-vilanterol (BREO ELLIPTA) 200-25 MCG/INH 1 puff (has no administration in time range)  ipratropium (ATROVENT HFA) inhaler 2 puff (has no administration in time  range)  gatifloxacin (ZYMAXID) 0.5 % ophthalmic drops 1 drop (has no administration in time range)  prednisoLONE acetate (PRED FORTE) 1 % ophthalmic suspension 1 drop (has no administration in time range)  sodium chloride flush (NS) 0.9 % injection 3 mL (has no administration in time range)  sodium chloride flush (NS) 0.9 % injection 3 mL (has no administration in time range)  0.9 %  sodium chloride infusion (has no administration in time range)  acetaminophen (TYLENOL) tablet 650 mg (has no administration in time range)  ondansetron (ZOFRAN) injection 4 mg (has no administration in time range)  furosemide (LASIX) injection 40 mg (has no administration in time range)  pantoprazole (PROTONIX) injection 40 mg (has no administration in time range)  octreotide (SANDOSTATIN) 500 mcg in sodium chloride 0.9 % 250 mL (2 mcg/mL) infusion (has no administration in time range)    Mobility walks Low fall risk   Focused Assessments Cardiac Assessment Handoff:    Lab Results  Component Value Date   CKTOTAL 133 11/18/2017   TROPONINI <0.03 02/12/2019   Lab Results  Component Value Date   DDIMER <0.27 06/04/2016   Does the Patient currently have chest pain? No     R Recommendations: See Admitting Provider Note  Report given to:   Additional Notes:

## 2019-02-13 NOTE — Consult Note (Addendum)
Referring Provider: [facility] Primary Care Physician:  Mosie Lukes, MD Primary Gastroenterologist: Owens Loffler, MD     Reason for Consultation: Cirrhosis /  GI bleed    ASSESSMENT / PLAN:    55.  68 year old male with alcoholic cirrhosis complicated by portal hypertension with history of small varices/portal hypertensive gastropathy / splenomegaly / remote ascites.  Admitted after isolated episode of painless rectal bleeding last evening and acute on chronic anemia ( macrocytic). He is pancytopenic. Blood bright red, BUN near baseline, hemodynamically stable. Suspect this is lower bleed however he does report two days of black stool 2 weeks ago ( resolved). Current bleeding ? perianal source.  Would expect more pronounced bleeding if diverticular (no known history of diverticular disease) or from rectal varices.   -INR ok at 1.4.  Does have severe thrombocytopenia and received platelets this a.m. -continue twice daily IV PPI -On octreotide drip -We will start Rocephin for SBP prophylaxis though on exam doesn't seem to have a lot of ascites if any.  -May need inpatient EGD given black stool two weeks ago. Known small varices but unfortunately he has continued to imbibe which could have worsened portal HTN. -U/S to evaluate for ascites and any liver lesions. If ascites will send for diagnostic tap when platelets higher -AFP, last one 3 years ago.     2. SOB. He has CHF, takes aldactone 50 mg daily and lasix 40 mg TIW at home.  EF 65 to 70% on echo September 2019. He has some lower extremity edema and SOB just moving from bedside chair to bed for examination. No overt pulmonary edema on CXR.  BNP not terribly elevated. His abdomen is protuberant ( ?baseline) but possibly just from organomegaly.  -will obtained abdominal u/s to evaluate for ascites.   3. Etoh use / ? Ongoing misuse. On CIWA  4. Hx of adenomatous colon polyps (moderate dysplasia) but none on last colonoscopy in  2015. He received letter from our office that it is time for surveillance colonoscopy.  -Procedure may still be done outpatient depending on clinical course here in the hospital   HPI:     Darren Mueller is a 68 y.o. male with CKD , CHF known to Dr. Ardis Hughs though not seen in the office in 3 years.  He has a history of adenomatous colon polyps, alcoholic cirrhosis with portal hypertension.   Patient presented to the ED April 11 with complaints of bloody stool but also swelling in both of his legs and SOB.  Presenting hemoglobin 9.1, approximately 2 g down from baseline.    ED evaluation:  WBC 3.4, hemoglobin 9.1 (baseline 11.2), MCV 112, platelets 35 INR 1.4 BUN 38, creatinine 1.78 (less to baseline) Alk phos 244, AST 55, ALT 22, total bilirubin 1.8, indirect bilirubin 1.2, ammonia 32 Brain natruretic peptide 158 CXR -cardiomegaly, mild perihilar vascular congestion without overt pulmonary edema.  Hemoglobin remained stable overnight at 9. No further bleeding since the one episode last night. Blood was bright red, filled toilet bowl No preceding constipation / straining. Reports chronically loose stools due to cardiac medications. He is trying to stop Etoh.   Most recent endoscopic evaluation May 2017 EGD for varices surveillance - Small (< 5 mm) esophageal varices. No change since last examination. Portal hypertensive gastropathy.   February 2015 colonoscopy - done for polyp surveillance and evaluation of diarrhea.  The exam was complete, the terminal ileum was intubated for short distance.  The bowel prep was excellent.  One  small sessile polyp was removed.  Path confirmed inflammatory polyp, no adenomatous tissue, no dysplasia.  Random colon biopsies were negative colonoscopy February 2015  Past Medical History:  Diagnosis Date  . Alcohol dependence ([place]) 03/22/2011   In remission   . Alcohol dependence in remission ([place]) 03/22/2011   In remission   . Alcoholic cirrhosis of liver  with ascites ([place]) 2014  . Anginal pain ([place])   . CHF (congestive heart failure) ([place])   . COPD (chronic obstructive pulmonary disease) ([place])   . Coronary artery disease   . Dermatitis 06/10/2015  . Diarrhea 09/04/2013  . Dyspnea   . Elbow pain, left 03/17/2017  . GERD (gastroesophageal reflux disease)   . Gout   . Headache   . Heart murmur   . Hx of colonic polyps   . Hypertension   . Hypertriglyceridemia 03/17/2017  . Hypopotassemia   . Otalgia 11/28/2013  . Other chronic pulmonary heart diseases   . Preventative health care 06/29/2016  . Red eye 03/03/2016   right  . Renal insufficiency 07/18/2013  . Seizures ([place])   . Sleep apnea    does not wear CPAP  . Thrombocytopenia ([place]) 06/29/2016  . Unspecified hypothyroidism 01/25/2013  . Unspecified pleural effusion     Past Surgical History:  Procedure Laterality Date  . CARDIAC CATHETERIZATION N/A 12/21/2015   Procedure: Right Heart Cath;  Surgeon: Jolaine Artist, MD;  Location: [facility];  Service: Cardiovascular;  Laterality: N/A;  . CARDIAC CATHETERIZATION N/A 04/15/2016   Procedure: Right/Left Heart Cath and Coronary Angiography;  Surgeon: Jolaine Artist, MD;  Location: [facility];  Service: Cardiovascular;  Laterality: N/A;  . COLONOSCOPY N/A 12/08/2013   Procedure: COLONOSCOPY;  Surgeon: Milus Banister, MD;  Location: [facility];  Service: Endoscopy;  Laterality: N/A;  . ESOPHAGOGASTRODUODENOSCOPY (EGD) WITH PROPOFOL N/A 03/27/2016   Procedure: ESOPHAGOGASTRODUODENOSCOPY (EGD) WITH PROPOFOL;  Surgeon: Milus Banister, MD;  Location: [facility];  Service: Endoscopy;  Laterality: N/A;  . LOOP RECORDER INSERTION N/A 01/01/2017   Procedure: Loop Recorder Insertion;  Surgeon: Thompson Grayer, MD;  Location: [facility];  Service: Cardiovascular;  Laterality: N/A;  . RIGHT HEART CATHETERIZATION Right 06/06/2014   Procedure: RIGHT HEART CATH;  Surgeon: Jolaine Artist, MD;  Location: [facility];  Service:  Cardiovascular;  Laterality: Right;  . UVULOPALATOPHARYNGOPLASTY  1999    Prior to Admission medications   Medication Sig Start Date End Date Taking? Authorizing Provider  Albuterol Sulfate (PROAIR RESPICLICK) 536 (90 Base) MCG/ACT AEPB Inhale 2 puffs into the lungs every 6 (six) hours as needed (SOB or wheezing). 09/10/18  Yes Mosie Lukes, MD  azelastine (ASTELIN) 0.1 % nasal spray Place 2 sprays into both nostrils 2 (two) times daily. Use in each nostril as directed Patient taking differently: Place 2 sprays into both nostrils daily as needed for rhinitis or allergies.  10/04/18  Yes Saguier, Percell Miller, PA-C  colchicine 0.6 MG tablet Take 1 tablet (0.6 mg total) by mouth 2 (two) times daily as needed. Patient taking differently: Take 0.6 mg by mouth daily.  09/27/18  Yes Mosie Lukes, MD  diphenoxylate-atropine (LOMOTIL) 2.5-0.025 MG tablet Take 1 tablet by mouth 3 (three) times daily. Patient taking differently: Take 1 tablet by mouth 4 (four) times daily as needed for diarrhea or loose stools.  08/26/18  Yes Mosie Lukes, MD  fluticasone (FLONASE) 50 MCG/ACT nasal spray Place 2 sprays into both nostrils daily. Patient taking  differently: Place 2 sprays into both nostrils daily as needed for allergies.  09/27/18  Yes Saguier, Percell Miller, PA-C  fluticasone-salmeterol (ADVAIR HFA) 230-21 MCG/ACT inhaler Inhale 2 puffs into the lungs every morning. 09/10/18  Yes Mosie Lukes, MD  furosemide (LASIX) 40 MG tablet Take 1 tablet (40 mg total) by mouth 3 (three) times a week. Every Mon, Wed and Friday Patient taking differently: Take 40 mg by mouth daily.  04/21/18  Yes Bensimhon, Shaune Pascal, MD  gatifloxacin (ZYMAXID) 0.5 % SOLN Place 1 drop into the right eye daily. 12/30/18  Yes [provider]  hyoscyamine (LEVSIN SL) 0.125 MG SL tablet Place 1 tablet (0.125 mg total) under the tongue every 6 (six) hours as needed. Patient taking differently: Place 0.125 mg under the tongue every 6 (six)  hours as needed for cramping.  09/24/18  Yes Mosie Lukes, MD  ipratropium (ATROVENT HFA) 17 MCG/ACT inhaler Inhale 2 puffs into the lungs every 6 (six) hours as needed for wheezing. 12/14/17  Yes Saguier, Percell Miller, PA-C  losartan (COZAAR) 50 MG tablet TAKE 1 TABLET DAILY 09/13/18  Yes Larey Dresser, MD  macitentan (OPSUMIT) 10 MG tablet Take 1 tablet (10 mg total) by mouth daily. 11/19/18  Yes Bensimhon, Shaune Pascal, MD  omeprazole (PRILOSEC) 40 MG capsule Take 1 capsule (40 mg total) by mouth daily. 09/27/18  Yes Saguier, Percell Miller, PA-C  potassium chloride (K-DUR) 10 MEQ tablet Take 2 tablets (20 mEq total) by mouth daily. 09/27/18  Yes Saguier, Percell Miller, PA-C  prednisoLONE acetate (PRED FORTE) 1 % ophthalmic suspension Place 1 drop into the right eye daily. 12/30/18  Yes [provider]  rosuvastatin (CRESTOR) 5 MG tablet Take 1 tablet (5 mg total) by mouth daily. 09/27/18  Yes Saguier, Percell Miller, PA-C  Selexipag (UPTRAVI) 1000 MCG TABS Take 1,000 mcg by mouth 2 (two) times daily. 09/24/18  Yes Bensimhon, Shaune Pascal, MD  spironolactone (ALDACTONE) 50 MG tablet TAKE 1 TABLET DAILY Patient taking differently: Take 50 mg by mouth daily.  02/27/17  Yes Mosie Lukes, MD  tadalafil, PAH, (ADCIRCA) 20 MG tablet TAKE 2 TABLETS (40 MG TOTAL) DAILY Patient taking differently: Take 40 mg by mouth daily.  07/19/18  Yes Bensimhon, Shaune Pascal, MD  tiZANidine (ZANAFLEX) 2 MG tablet Take 0.5-2 tablets (1-4 mg total) by mouth every 6 (six) hours as needed for muscle spasms. 05/04/18  Yes Mosie Lukes, MD  allopurinol (ZYLOPRIM) 100 MG tablet Take 2 tablets (200 mg total) by mouth daily. Patient not taking: Reported on 02/13/2019 05/04/18   Mosie Lukes, MD  cholestyramine Lucrezia Starch) 4 g packet Take 1 packet (4 g total) by mouth 2 (two) times daily. Patient not taking: Reported on 02/13/2019 05/04/18   Mosie Lukes, MD    Current Facility-Administered Medications  Medication Dose Route Frequency Provider Last  Rate Last Dose  . 0.9 %  sodium chloride infusion  250 mL Intravenous PRN Opyd, Ilene Qua, MD      . acetaminophen (TYLENOL) tablet 650 mg  650 mg Oral Q4H PRN Opyd, Ilene Qua, MD   650 mg at 02/13/19 0429  . albuterol (PROVENTIL) (2.5 MG/3ML) 0.083% nebulizer solution 2.5 mg  2.5 mg Inhalation Q6H PRN Opyd, Ilene Qua, MD      . fluticasone furoate-vilanterol (BREO ELLIPTA) 200-25 MCG/INH 1 puff  1 puff Inhalation Daily Opyd, Ilene Qua, MD   1 puff at 02/13/19 0800  . folic acid (FOLVITE) tablet 1 mg  1 mg Oral Daily Opyd,  Ilene Qua, MD   1 mg at 02/13/19 9735  . furosemide (LASIX) injection 40 mg  40 mg Intravenous BID Opyd, Ilene Qua, MD   40 mg at 02/13/19 0408  . gatifloxacin (ZYMAXID) 0.5 % ophthalmic drops 1 drop  1 drop Right Eye Daily Opyd, Ilene Qua, MD   1 drop at 02/13/19 0840  . ipratropium (ATROVENT) nebulizer solution 0.5 mg  0.5 mg Inhalation Q6H PRN Opyd, Ilene Qua, MD      . LORazepam (ATIVAN) injection 0-4 mg  0-4 mg Intravenous Q6H Opyd, Ilene Qua, MD       Followed by  . [START ON 02/15/2019] LORazepam (ATIVAN) injection 0-4 mg  0-4 mg Intravenous Q12H Opyd, Timothy S, MD      . LORazepam (ATIVAN) tablet 1 mg  1 mg Oral Q6H PRN Opyd, Ilene Qua, MD       Or  . LORazepam (ATIVAN) injection 1 mg  1 mg Intravenous Q6H PRN Opyd, Ilene Qua, MD      . multivitamin with minerals tablet 1 tablet  1 tablet Oral Daily Opyd, Ilene Qua, MD   1 tablet at 02/13/19 0832  . octreotide (SANDOSTATIN) 500 mcg in sodium chloride 0.9 % 250 mL (2 mcg/mL) infusion  50 mcg/hr Intravenous Continuous Opyd, Ilene Qua, MD 25 mL/hr at 02/13/19 0417 50 mcg/hr at 02/13/19 0417  . ondansetron (ZOFRAN) injection 4 mg  4 mg Intravenous Q6H PRN Opyd, Ilene Qua, MD   4 mg at 02/13/19 1011  . pantoprazole (PROTONIX) injection 40 mg  40 mg Intravenous Q12H Opyd, Ilene Qua, MD   40 mg at 02/13/19 0833  . prednisoLONE acetate (PRED FORTE) 1 % ophthalmic suspension 1 drop  1 drop Right Eye Daily Opyd, Ilene Qua, MD   1  drop at 02/13/19 0838  . sodium chloride flush (NS) 0.9 % injection 3 mL  3 mL Intravenous Q12H Opyd, Ilene Qua, MD   3 mL at 02/13/19 0837  . sodium chloride flush (NS) 0.9 % injection 3 mL  3 mL Intravenous PRN Opyd, Ilene Qua, MD      . thiamine (VITAMIN B-1) tablet 100 mg  100 mg Oral Daily Opyd, Ilene Qua, MD       Or  . thiamine (B-1) injection 100 mg  100 mg Intravenous Daily Opyd, Ilene Qua, MD   100 mg at 02/13/19 0835    Allergies as of 02/12/2019 - Review Complete 02/12/2019  Allergen Reaction Noted  . Aspirin Other (See Comments) 03/21/2011  . Nitroglycerin  05/07/2017    Family History  Problem Relation Age of Onset  . Heart disease Mother   . Heart attack Mother   . Hypertension Mother   . Kidney failure Mother   . Liver disease Mother        stage 4 liver disease  . Prostate cancer Father   . Cancer Father        lung  . Hypertension Brother   . Gout Brother   . Alcoholism Maternal Grandfather   . Liver disease Maternal Grandfather   . Migraines Sister   . Hypertension Brother   . Colon cancer Neg Hx     Social History   Socioeconomic History  . Marital status: Divorced    Spouse name: Not on file  . Number of children: 3  . Years of education: Not on file  . Highest education level: Not on file  Occupational History  . Occupation: Retired    Hydrologist: Social research officer, [employer]  Social Needs  . Financial resource strain: Not hard at all  . Food insecurity:    Worry: Never true    Inability: Never true  . Transportation needs:    Medical: No    Non-medical: No  Tobacco Use  . Smoking status: Former Smoker    Packs/day: 2.00    Years: 5.00    Pack years: 10.00    Types: Cigarettes    Last attempt to quit: 09/22/1979    Years since quitting: 39.4  . Smokeless tobacco: Never Used  Substance and Sexual Activity  . Alcohol use: Yes    Alcohol/week: 6.0 standard drinks    Types: 6 Shots of liquor per week    Comment: social drinker  . Drug use: No  . Sexual  activity: Yes    Birth control/protection: Spermicide  Lifestyle  . Physical activity:    Days per week: 0 days    Minutes per session: 0 min  . Stress: Rather much  Relationships  . Social connections:    Talks on phone: More than three times a week    Gets together: Three times a week    Attends religious service: More than 4 times per year    Active member of club or organization: Yes    Attends meetings of clubs or organizations: More than 4 times per year    Relationship status: Divorced  . Intimate partner violence:    Fear of current or ex partner: Not on file    Emotionally abused: Not on file    Physically abused: Not on file    Forced sexual activity: Not on file  Other Topics Concern  . Not on file  Social History Narrative   0 caffeine drinks daily     Review of Systems: All systems reviewed and negative except where noted in HPI.  Physical Exam: Vital signs in last 24 hours: Temp:  [97.9 F (36.6 C)-99.4 F (37.4 C)] 97.9 F (36.6 C) (04/12 1138) Pulse Rate:  [70-113] 72 (04/12 1138) Resp:  [14-28] 18 (04/12 1138) BP: (105-154)/(63-88) 122/69 (04/12 1138) SpO2:  [98 %-100 %] 100 % (04/12 1138) Weight:  [91.1 kg-96.6 kg] 91.1 kg (04/12 0311) Last BM Date: 02/12/19(prior to coming Hospital) General:   Alert, well-developed male in NAD Psych:  Pleasant, cooperative. Normal mood and affect. Eyes:  Pupils equal, sclera clear, no icterus.   Conjunctiva pink. Ears:  Normal auditory acuity. Nose:  No deformity, discharge,  or lesions. Neck:  Supple; no masses Lungs:  Clear throughout to auscultation.   No wheezes, crackles, or rhonchi.  Heart:  Sinus tachycardia, 1+ BLE edema Abdomen:  Soft, protuberant. Amount of tympany speaks against massive ascites. Organomegaly. Nontender, BS active   Rectal:  No external lesions. On DRE there is brown stool residual.  Msk:  Symmetrical without gross deformities. . Neurologic:  Alert and  oriented x4;  grossly normal  neurologically. Skin:  Intact without significant lesions or rashes.   Intake/Output from previous day: 04/11 0701 - 04/12 0700 In: 90 [P.O.:60; Blood:30] Out: 325 [Urine:325] Intake/Output this shift: Total I/O In: 609 [P.O.:120; I.V.:50; Blood:439] Out: 600 [Urine:600]  Lab Results: Recent Labs    02/12/19 2356 02/13/19 0507  WBC 3.4* 3.0*  HGB 9.1* 9.0*  HCT 29.1* 28.1*  PLT 35* 43*   BMET Recent Labs    02/12/19 2356 02/13/19 0507  NA 138 140  K 4.0 3.8  CL 109 107  CO2 19* 19*  GLUCOSE 99 75  BUN 38* 42*  CREATININE 1.78* 1.74*  CALCIUM 8.4* 8.6*   LFT Recent Labs    02/12/19 2356 02/13/19 0507  PROT 6.0* 6.2*  ALBUMIN 2.9* 2.9*  AST 55* 53*  ALT 22 23  ALKPHOS 244* 226*  BILITOT 1.8* 1.8*  BILIDIR 0.6*  --   IBILI 1.2*  --    PT/INR Recent Labs    02/12/19 2356  LABPROT 17.1*  INR 1.4*   Hepatitis Panel No results for input(s): HEPBSAG, HCVAB, HEPAIGM, HEPBIGM in the last 72 hours.    Studies/Results: Dg Chest Port 1 View  Result Date: 02/13/2019 CLINICAL DATA:  Initial evaluation for acute chest pain, shortness of breath. EXAM: PORTABLE CHEST 1 VIEW COMPARISON:  Prior radiograph from 09/27/2018. FINDINGS: Cardiomegaly, stable. Loop recorder overlies the left heart, also unchanged. Mediastinal silhouette within normal limits. Lungs normally inflated. Mild perihilar vascular congestion without overt pulmonary edema. No definite pleural effusion. No consolidative opacity. No pneumothorax. No acute osseous finding. IMPRESSION: 1. Cardiomegaly with mild perihilar vascular congestion without overt pulmonary edema. 2. No other active cardiopulmonary disease. Electronically Signed   By: Jeannine Boga M.D.   On: 02/13/2019 00:55   Tye Savoy, NP-C @  02/13/2019, 12:10 PM  GI ATTENDING  History, laboratories, x-rays, prior endoscopy reports reviewed.  Patient seen and examined.  Agree with comprehensive consultation note as outlined  above.  IMPRESSION: 1.  Pancytopenia secondary to alcohol-related bone marrow suppression and portal hypertension.  Also component of renal disease which may exacerbate his anemia 2.  Report of transient dark stools.  Question melena.  Does not sound like variceal bleed.  May be secondary to portal hypertensive gastropathy which was noted on his endoscopy 3 years ago.  Small varices only.  He would be due for follow-up surveillance endoscopy at this time to assess for varices. 3.  Question ascites.   4.  Minor rectal bleeding.  Suspect internal hemorrhoids which were noted on retroflexed view (but not mention) of his colonoscopy report images. 5.  History of adenomatous polyp 6.  Chronic alcoholism.  Ongoing 7.  Renal insufficiency 8.  Multiple other medical problems  RECOMMENDATIONS: 1.  Octreotide for now 2.  IV PPI for now 3.  Schedule upper endoscopy tomorrow to further assess transient reports of dark stools.  Assess for clinically relevant varices.  Assess for portal gastropathy.  This will allow [place] to most efficiently triage his risk of GI bleeding moving forward.  We will set this up tomorrow with Dr. Hilarie Fredrickson.The nature of the procedure, as well as the risks, benefits, and alternatives were carefully and thoroughly reviewed with the patient. Ample time for discussion and questions allowed. The patient understood, was satisfied, and agreed to proceed. 4.  Continue antibiotics for now (cirrhotic with GI bleed) 5.  Outpatient colonoscopy with Dr. Ardis Hughs in upcoming months.  He should have a follow-up office evaluation prior to this to reestablish his GI care 6.  Stop drinking alcohol!  Forever! 7.  Abdominal ultrasound to assess for ascites  Addendum: For EGD today to eval black stools in setting of ETOH cirrhosis. HIGHER THAN BASELINE RISK.The nature of the procedure, as well as the risks, benefits, and alternatives were carefully and thoroughly reviewed with the patient. Ample time for  discussion and questions allowed. The patient understood, was satisfied, and agreed to proceed.   CBC Latest Ref Rng & Units 02/14/2019 02/13/2019 02/13/2019  WBC 4.0 - 10.5 K/uL 2.2(L) 2.0(L) 3.0(L)  Hemoglobin 13.0 - 17.0 g/dL 8.8(L) 7.8(L)  9.0(L)  Hematocrit 39.0 - 52.0 % 27.4(L) 24.8(L) 28.1(L)  Platelets 150 - 400 K/uL 49(L) 39(L) 43(L)   Lab Results  Component Value Date   INR 1.4 (H) 02/12/2019   INR 1.25 06/04/2016   INR 1.2 (H) 06/03/2016   PROTIME 14.4 (H) 06/15/2015

## 2019-02-13 NOTE — H&P (Signed)
History and Physical    Darren Mueller URK:[id number] DOB: December 26, 1950 DOA: 02/12/2019  PCP: Mosie Lukes, MD   Patient coming from: Home   Chief Complaint: Rectal bleeding   HPI: Darren Mueller is a 68 y.o. male with medical history significant for alcoholic liver cirrhosis with ascites, pulmonary arterial hypertension, chronic kidney disease stage III, chronic pancytopenia, hypertension, and history of seizure, now presenting to the emergency department after an episode of rectal bleeding.  Patient reports that he was having black stool last week, and then tonight he was having some mild cramping discomfort in his abdomen, went to the restroom to move his bowels, and reports "a bowl full of red blood."  He has not had any nausea or vomiting with this, reports some lightheadedness but did not pass out.  He has been experiencing some intermittent chest pain, increased leg swelling, and mild dyspnea recently.  He had an episode of chest pain tonight in the ED that resolved spontaneously.  Reports that he continues to drink alcohol, but has cut back significantly.  Denies fevers, cough, travel, or sick contacts.  ED Course: Upon arrival to the ED, patient is found to be saturating well on room air with normal heart rate, respirations, and blood pressure.  EKG features a sinus rhythm with ST depressions in V1 through V3, T wave inversions in leads II, III, aVF, aVR, and V1 through V5, but not significantly changed from prior.  Chest x-ray is notable for cardiomegaly with mild vascular congestion.  Chemistry panel features a BUN of 38, creatinine 1.78, and total bilirubin of 1.8.  CBC is notable for an improved leukopenia, now 3400, worsened macrocytic anemia with hemoglobin now 9.1, and a stable chronic thrombocytopenia with platelets 35,000.  INR is 1.4, troponin undetectable, and BNP 158.  Patient remains hemodynamically stable in the ED and will be observed for ongoing evaluation and management of  acute GI bleeding.  Review of Systems:  All other systems reviewed and apart from HPI, are negative.  Past Medical History:  Diagnosis Date   Alcohol dependence ([place]) 03/22/2011   In remission    Alcohol dependence in remission ([place]) 03/22/2011   In remission    Alcoholic cirrhosis of liver with ascites ([place]) 2014   Dermatitis 06/10/2015   Diarrhea 09/04/2013   Elbow pain, left 03/17/2017   GERD (gastroesophageal reflux disease)    Gout    Heart murmur    Hx of colonic polyps    Hypertension    Hypertriglyceridemia 03/17/2017   Hypopotassemia    Otalgia 11/28/2013   Other chronic pulmonary heart diseases    Preventative health care 06/29/2016   Red eye 03/03/2016   right   Renal insufficiency 07/18/2013   Seizures ([place])    Sleep apnea    does not wear CPAP   Thrombocytopenia ([place]) 06/29/2016   Unspecified hypothyroidism 01/25/2013   Unspecified pleural effusion     Past Surgical History:  Procedure Laterality Date   CARDIAC CATHETERIZATION N/A 12/21/2015   Procedure: Right Heart Cath;  Surgeon: Jolaine Artist, MD;  Location: [place] [facility];  Service: Cardiovascular;  Laterality: N/A;   CARDIAC CATHETERIZATION N/A 04/15/2016   Procedure: Right/Left Heart Cath and Coronary Angiography;  Surgeon: Jolaine Artist, MD;  Location: [place] [facility];  Service: Cardiovascular;  Laterality: N/A;   COLONOSCOPY N/A 12/08/2013   Procedure: COLONOSCOPY;  Surgeon: Milus Banister, MD;  Location: [facility];  Service: Endoscopy;  Laterality: N/A;   ESOPHAGOGASTRODUODENOSCOPY (EGD) WITH  PROPOFOL N/A 03/27/2016   Procedure: ESOPHAGOGASTRODUODENOSCOPY (EGD) WITH PROPOFOL;  Surgeon: Milus Banister, MD;  Location: [facility];  Service: Endoscopy;  Laterality: N/A;   LOOP RECORDER INSERTION N/A 01/01/2017   Procedure: Loop Recorder Insertion;  Surgeon: Thompson Grayer, MD;  Location: [place];  Service: Cardiovascular;  Laterality: N/A;   RIGHT HEART  CATHETERIZATION Right 06/06/2014   Procedure: RIGHT HEART CATH;  Surgeon: Jolaine Artist, MD;  Location: [facility];  Service: Cardiovascular;  Laterality: Right;   UVULOPALATOPHARYNGOPLASTY  1999     reports that he quit smoking about 39 years ago. His smoking use included cigarettes. He has a 10.00 pack-year smoking history. He has never used smokeless tobacco. He reports current alcohol use of about 6.0 standard drinks of alcohol per week. He reports that he does not use drugs.  Allergies  Allergen Reactions   Aspirin Other (See Comments)    hypertension   Nitroglycerin     Headache    Family History  Problem Relation Age of Onset   Heart disease Mother    Heart attack Mother    Hypertension Mother    Kidney failure Mother    Liver disease Mother        stage 75 liver disease   Prostate cancer Father    Cancer Father        lung   Hypertension Brother    Gout Brother    Alcoholism Maternal Grandfather    Liver disease Maternal Grandfather    Migraines Sister    Hypertension Brother    Colon cancer Neg Hx      Prior to Admission medications   Medication Sig Start Date End Date Taking? Authorizing Provider  Albuterol Sulfate (PROAIR RESPICLICK) 191 (90 Base) MCG/ACT AEPB Inhale 2 puffs into the lungs every 6 (six) hours as needed (SOB or wheezing). 09/10/18  Yes Mosie Lukes, MD  azelastine (ASTELIN) 0.1 % nasal spray Place 2 sprays into both nostrils 2 (two) times daily. Use in each nostril as directed Patient taking differently: Place 2 sprays into both nostrils daily as needed for rhinitis or allergies.  10/04/18  Yes Saguier, Percell Miller, PA-C  colchicine 0.6 MG tablet Take 1 tablet (0.6 mg total) by mouth 2 (two) times daily as needed. Patient taking differently: Take 0.6 mg by mouth daily.  09/27/18  Yes Mosie Lukes, MD  diphenoxylate-atropine (LOMOTIL) 2.5-0.025 MG tablet Take 1 tablet by mouth 3 (three) times daily. Patient taking differently:  Take 1 tablet by mouth 4 (four) times daily as needed for diarrhea or loose stools.  08/26/18  Yes Mosie Lukes, MD  fluticasone (FLONASE) 50 MCG/ACT nasal spray Place 2 sprays into both nostrils daily. Patient taking differently: Place 2 sprays into both nostrils daily as needed for allergies.  09/27/18  Yes Saguier, Percell Miller, PA-C  fluticasone-salmeterol (ADVAIR HFA) 230-21 MCG/ACT inhaler Inhale 2 puffs into the lungs every morning. 09/10/18  Yes Mosie Lukes, MD  furosemide (LASIX) 40 MG tablet Take 1 tablet (40 mg total) by mouth 3 (three) times a week. Every Mon, Wed and Friday Patient taking differently: Take 40 mg by mouth daily.  04/21/18  Yes Bensimhon, Shaune Pascal, MD  gatifloxacin (ZYMAXID) 0.5 % SOLN Place 1 drop into the right eye daily. 12/30/18  Yes [provider]  hyoscyamine (LEVSIN SL) 0.125 MG SL tablet Place 1 tablet (0.125 mg total) under the tongue every 6 (six) hours as needed. Patient taking differently: Place 0.125 mg under  the tongue every 6 (six) hours as needed for cramping.  09/24/18  Yes Mosie Lukes, MD  ipratropium (ATROVENT HFA) 17 MCG/ACT inhaler Inhale 2 puffs into the lungs every 6 (six) hours as needed for wheezing. 12/14/17  Yes Saguier, Percell Miller, PA-C  losartan (COZAAR) 50 MG tablet TAKE 1 TABLET DAILY 09/13/18  Yes Larey Dresser, MD  macitentan (OPSUMIT) 10 MG tablet Take 1 tablet (10 mg total) by mouth daily. 11/19/18  Yes Bensimhon, Shaune Pascal, MD  omeprazole (PRILOSEC) 40 MG capsule Take 1 capsule (40 mg total) by mouth daily. 09/27/18  Yes Saguier, Percell Miller, PA-C  potassium chloride (K-DUR) 10 MEQ tablet Take 2 tablets (20 mEq total) by mouth daily. 09/27/18  Yes Saguier, Percell Miller, PA-C  prednisoLONE acetate (PRED FORTE) 1 % ophthalmic suspension Place 1 drop into the right eye daily. 12/30/18  Yes [provider]  rosuvastatin (CRESTOR) 5 MG tablet Take 1 tablet (5 mg total) by mouth daily. 09/27/18  Yes Saguier, Percell Miller, PA-C  Selexipag  (UPTRAVI) 1000 MCG TABS Take 1,000 mcg by mouth 2 (two) times daily. 09/24/18  Yes Bensimhon, Shaune Pascal, MD  spironolactone (ALDACTONE) 50 MG tablet TAKE 1 TABLET DAILY Patient taking differently: Take 50 mg by mouth daily.  02/27/17  Yes Mosie Lukes, MD  tadalafil, PAH, (ADCIRCA) 20 MG tablet TAKE 2 TABLETS (40 MG TOTAL) DAILY Patient taking differently: Take 40 mg by mouth daily.  07/19/18  Yes Bensimhon, Shaune Pascal, MD  tiZANidine (ZANAFLEX) 2 MG tablet Take 0.5-2 tablets (1-4 mg total) by mouth every 6 (six) hours as needed for muscle spasms. 05/04/18  Yes Mosie Lukes, MD  allopurinol (ZYLOPRIM) 100 MG tablet Take 2 tablets (200 mg total) by mouth daily. Patient not taking: Reported on 02/13/2019 05/04/18   Mosie Lukes, MD  cholestyramine Lucrezia Starch) 4 g packet Take 1 packet (4 g total) by mouth 2 (two) times daily. Patient not taking: Reported on 02/13/2019 05/04/18   Mosie Lukes, MD    Physical Exam: Vitals:   02/12/19 2345 02/12/19 2345 02/13/19 0112 02/13/19 0141  BP: (!) 144/86     Pulse: 93  89 90  Resp: (!) _0 SpO2: 100%  100% 100%  Weight:  96.6 kg    Height:  6' (1.829 m)      Constitutional: NAD, calm  Eyes: PERTLA, lids and conjunctivae normal ENMT: Mucous membranes are moist. Posterior pharynx clear of any exudate or lesions.   Neck: normal, supple, no masses, no thyromegaly Respiratory:  no wheezing, no crackles. No accessory muscle use.  Cardiovascular: S1 & S2 heard, regular rate and rhythm. Bilateral LE edema. Abdomen: Mild distension, soft, non-tender. Bowel sounds active.  Musculoskeletal: no clubbing / cyanosis. No joint deformity upper and lower extremities.   Skin: no significant rashes, lesions, ulcers. Warm, dry, well-perfused. Neurologic: CN 2-12 grossly intact. Sensation intact. Moving all extremities.  Psychiatric: Alert and oriented x 3. Pleasant, cooperative.    Labs on Admission: I have personally reviewed following labs and imaging  studies  CBC: Recent Labs  Lab 02/12/19 2356  WBC 3.4*  NEUTROABS 2.2  HGB 9.1*  HCT 29.1*  MCV 112.8*  PLT 35*   Basic Metabolic Panel: Recent Labs  Lab 02/12/19 2356  NA 138  K 4.0  CL 109  CO2 19*  GLUCOSE 99  BUN 38*  CREATININE 1.78*  CALCIUM 8.4*   GFR: Estimated Creatinine Clearance: 47.2 mL/min (A) (by C-G formula based on SCr of  1.78 mg/dL (H)). Liver Function Tests: Recent Labs  Lab 02/12/19 2356  AST 55*  ALT 22  ALKPHOS 244*  BILITOT 1.8*  PROT 6.0*  ALBUMIN 2.9*   No results for input(s): LIPASE, AMYLASE in the last 168 hours. Recent Labs  Lab 02/12/19 2356  AMMONIA 32   Coagulation Profile: Recent Labs  Lab 02/12/19 2356  INR 1.4*   Cardiac Enzymes: Recent Labs  Lab 02/12/19 2356  TROPONINI <0.03   BNP (last 3 results) Recent Labs    09/22/18 1405 09/27/18 0913  PROBNP 859.0* 415.0*   HbA1C: No results for input(s): HGBA1C in the last 72 hours. CBG: No results for input(s): GLUCAP in the last 168 hours. Lipid Profile: No results for input(s): CHOL, HDL, LDLCALC, TRIG, CHOLHDL, LDLDIRECT in the last 72 hours. Thyroid Function Tests: No results for input(s): TSH, T4TOTAL, FREET4, T3FREE, THYROIDAB in the last 72 hours. Anemia Panel: No results for input(s): VITAMINB12, FOLATE, FERRITIN, TIBC, IRON, RETICCTPCT in the last 72 hours. Urine analysis:    Component Value Date/Time   COLORURINE YELLOW 11/18/2017 0250   APPEARANCEUR CLEAR 11/18/2017 0250   LABSPEC 1.008 11/18/2017 0250   PHURINE 5.0 11/18/2017 0250   GLUCOSEU NEGATIVE 11/18/2017 0250   HGBUR NEGATIVE 11/18/2017 0250   BILIRUBINUR neg 12/18/2017 1352   KETONESUR NEGATIVE 11/18/2017 0250   PROTEINUR net 12/18/2017 1352   PROTEINUR NEGATIVE 11/18/2017 0250   UROBILINOGEN 0.2 12/18/2017 1352   UROBILINOGEN 0.2 10/18/2013 1247   NITRITE neg 12/18/2017 1352   NITRITE NEGATIVE 11/18/2017 0250   LEUKOCYTESUR Negative 12/18/2017 1352   Sepsis  Labs: _0 (procalcitonin:4,lacticidven:4) )No results found for this or any previous visit (from the past 240 hour(s)).   Radiological Exams on Admission: Dg Chest Port 1 View  Result Date: 02/13/2019 CLINICAL DATA:  Initial evaluation for acute chest pain, shortness of breath. EXAM: PORTABLE CHEST 1 VIEW COMPARISON:  Prior radiograph from 09/27/2018. FINDINGS: Cardiomegaly, stable. Loop recorder overlies the left heart, also unchanged. Mediastinal silhouette within normal limits. Lungs normally inflated. Mild perihilar vascular congestion without overt pulmonary edema. No definite pleural effusion. No consolidative opacity. No pneumothorax. No acute osseous finding. IMPRESSION: 1. Cardiomegaly with mild perihilar vascular congestion without overt pulmonary edema. 2. No other active cardiopulmonary disease. Electronically Signed   By: Jeannine Boga M.D.   On: 02/13/2019 00:55    EKG: Independently reviewed. Sinus rhythm, ST-depressions V1-V3, T-wave inversions in II, III, aVF, aVR, V1-V5. Similar to prior.   Assessment/Plan   1. Acute GI bleeding  - Presents following an episode of red rectal bleeding after having melena last week  - He is hemodynamically stable in ED with Hgb 9.1, down from 11.2 in January; platelets only 35k which is same as priors  - EGD from 2017 with small esophageal varices and colonoscopy in 2015 with a polyp that was removed and abnormal appearing mucosa that was biopsied (both negative for dysplasia or malignancy)  - The episode tonight sounds like a diverticular or AVM bleed though neither of those noted on past colonoscopy; with cirrhosis and recent black stools, a brisk upper GIB is considered though seems less likely  - Type & screen, keep NPO, transfuse platelets to 50k, follow serial H&H, start IV PPI and octreotide for now    2. Pulmonary HTN; acute on chronic RV failure  - Patient reports recent worsening in his chronic SOB and swelling, noted to  have significant peripheral edema on exam, vascular congetion on CXR, 9 lb wt-gain since office visit ~  6 wks ago - Diurese with Lasix 40 mg IV q12h, follow daily wt and I/O's, oral medications held on admission in light of acute GIB   3. Cirrhosis  - Secondary to EtOH and/or RV failure  - Hx of small esophageal varices in May 2017; denies hx of SBP  - INR 1.4, total bili 1.8, both slightly higher than priors  - MELD score is 18 on admission  - IV diuretic and PPI as above, monitor electrolytes    4. Pancytopenia  - WBC is stable at 3,900 on admission, Hgb 9.1 from 11.2 in January, and platelets stable at 35,000  - Likely secondary to EtOH and chronic liver disease  - With acute GIB, platelet transfusion and serial CBC's ordered    5. COPD  - No cough or wheezing on admission  - Continue ICS/LABA and as-needed albuterol and Atrovent    6. CKD III   - SCr is 1.78 on admission, similar to priors  - Renally-dose medications, monitor electrolytes and renal function during diuresis    7. Chest pain  - Patient reports occasional chest pain in recent weeks, had a fleeting episode in ED  - EKG with ST-T abnormalities that do not appear significantly different than prior; troponin is <0.03; CXR with cardiomegaly and vascular congestion  - Continue cardiac monitoring, repeat troponin and EKG    8. Seizure  - Patient has history of single seizure in January 2019, recently taken off of Keppra by his neurologist     PPE: Mask, face shield  DVT prophylaxis: SCD's  Code Status: Full  Family Communication: Discussed with patient  Consults called: None Admission status: Observation     Vianne Bulls, MD [facility] Pager [phone]  If 7PM-7AM, please contact night-coverage [email or web address] Password [email or web address]  02/13/2019, 2:25 AM

## 2019-02-13 NOTE — Progress Notes (Signed)
Pt ambulated in a hallway without SOB/distress, aware of the procedure USG later this evening and EGD and paracentesis tomorrow, post blood transfusion CBC order is placed, denies pain, no BM so far, will continue to monitor the patient  Palma Holter, RN

## 2019-02-13 NOTE — Progress Notes (Signed)
Discussed patient going down for ultrasound with ultrasound tech.  Patient is far down on schedule, may resume clear diet and ultrasound will be performed tomorrow morning while patient is down for paracentesis.

## 2019-02-13 NOTE — Anesthesia Preprocedure Evaluation (Addendum)
Anesthesia Evaluation  Patient identified by MRN, date of birth, ID band Patient awake    Reviewed: Allergy & Precautions, H&P , NPO status , Patient's Chart, lab work & pertinent test results  Airway Mallampati: III  TM Distance: >3 FB Neck ROM: Full    Dental no notable dental hx. (+) Teeth Intact, Dental Advisory Given   Pulmonary sleep apnea , COPD,  COPD inhaler, former smoker,    Pulmonary exam normal breath sounds clear to auscultation       Cardiovascular Exercise Tolerance: Good hypertension, Pt. on medications + CAD and +CHF   Rhythm:Regular Rate:Normal     Neuro/Psych  Headaches, Seizures -,  negative psych ROS   GI/Hepatic Neg liver ROS, GERD  Medicated and Controlled,  Endo/Other  negative endocrine ROSHypothyroidism   Renal/GU Renal InsufficiencyRenal disease  negative genitourinary   Musculoskeletal   Abdominal   Peds  Hematology negative hematology ROS (+)   Anesthesia Other Findings   Reproductive/Obstetrics negative OB ROS                            Anesthesia Physical Anesthesia Plan  ASA: III  Anesthesia Plan: General   Post-op Pain Management:    Induction: Intravenous  PONV Risk Score and Plan: 2 and Ondansetron, Treatment may vary due to age or medical condition and Dexamethasone  Airway Management Planned: Oral ETT  Additional Equipment:   Intra-op Plan:   Post-operative Plan: Extubation in OR  Informed Consent: I have reviewed the patients History and Physical, chart, labs and discussed the procedure including the risks, benefits and alternatives for the proposed anesthesia with the patient or authorized representative who has indicated his/her understanding and acceptance.     Dental advisory given  Plan Discussed with: CRNA  Anesthesia Plan Comments:         Anesthesia Quick Evaluation

## 2019-02-14 ENCOUNTER — Observation Stay (HOSPITAL_COMMUNITY): Payer: Medicare Other | Admitting: Anesthesiology

## 2019-02-14 ENCOUNTER — Telehealth: Payer: Self-pay

## 2019-02-14 ENCOUNTER — Encounter (HOSPITAL_COMMUNITY): Payer: Self-pay | Admitting: Certified Registered"

## 2019-02-14 ENCOUNTER — Observation Stay (HOSPITAL_COMMUNITY): Payer: Medicare Other

## 2019-02-14 ENCOUNTER — Encounter (HOSPITAL_COMMUNITY)
Admission: EM | Disposition: A | Discharge status: Home / Self Care | Payer: Self-pay | Source: Home / Self Care | Attending: Emergency Medicine

## 2019-02-14 DIAGNOSIS — K3189 Other diseases of stomach and duodenum: Secondary | ICD-10-CM

## 2019-02-14 DIAGNOSIS — K922 Gastrointestinal hemorrhage, unspecified: Secondary | ICD-10-CM | POA: Diagnosis not present

## 2019-02-14 DIAGNOSIS — K921 Melena: Secondary | ICD-10-CM | POA: Diagnosis not present

## 2019-02-14 DIAGNOSIS — J449 Chronic obstructive pulmonary disease, unspecified: Secondary | ICD-10-CM | POA: Diagnosis not present

## 2019-02-14 DIAGNOSIS — K766 Portal hypertension: Secondary | ICD-10-CM

## 2019-02-14 DIAGNOSIS — I8510 Secondary esophageal varices without bleeding: Secondary | ICD-10-CM | POA: Diagnosis not present

## 2019-02-14 DIAGNOSIS — D509 Iron deficiency anemia, unspecified: Secondary | ICD-10-CM | POA: Diagnosis not present

## 2019-02-14 DIAGNOSIS — D61818 Other pancytopenia: Secondary | ICD-10-CM | POA: Diagnosis not present

## 2019-02-14 DIAGNOSIS — Z87891 Personal history of nicotine dependence: Secondary | ICD-10-CM | POA: Diagnosis not present

## 2019-02-14 DIAGNOSIS — I8500 Esophageal varices without bleeding: Secondary | ICD-10-CM | POA: Diagnosis not present

## 2019-02-14 DIAGNOSIS — K625 Hemorrhage of anus and rectum: Secondary | ICD-10-CM | POA: Diagnosis not present

## 2019-02-14 DIAGNOSIS — K449 Diaphragmatic hernia without obstruction or gangrene: Secondary | ICD-10-CM | POA: Diagnosis not present

## 2019-02-14 DIAGNOSIS — K7460 Unspecified cirrhosis of liver: Secondary | ICD-10-CM | POA: Diagnosis not present

## 2019-02-14 HISTORY — PX: ESOPHAGOGASTRODUODENOSCOPY (EGD) WITH PROPOFOL: SHX5813

## 2019-02-14 LAB — AFP TUMOR MARKER: AFP, Serum, Tumor Marker: 1.8 ng/mL (ref 0.0–8.3)

## 2019-02-14 LAB — CBC WITH DIFFERENTIAL/PLATELET
Abs Immature Granulocytes: 0.01 10*3/uL (ref 0.00–0.07)
Basophils Absolute: 0 10*3/uL (ref 0.0–0.1)
Basophils Relative: 0 %
Eosinophils Absolute: 0.1 10*3/uL (ref 0.0–0.5)
Eosinophils Relative: 3 %
HCT: 27.4 % — ABNORMAL LOW (ref 39.0–52.0)
Hemoglobin: 8.8 g/dL — ABNORMAL LOW (ref 13.0–17.0)
Immature Granulocytes: 1 %
Lymphocytes Relative: 30 %
Lymphs Abs: 0.7 10*3/uL (ref 0.7–4.0)
MCH: 34.2 pg — ABNORMAL HIGH (ref 26.0–34.0)
MCHC: 32.1 g/dL (ref 30.0–36.0)
MCV: 106.6 fL — ABNORMAL HIGH (ref 80.0–100.0)
Monocytes Absolute: 0.4 10*3/uL (ref 0.1–1.0)
Monocytes Relative: 16 %
Neutro Abs: 1.1 10*3/uL — ABNORMAL LOW (ref 1.7–7.7)
Neutrophils Relative %: 50 %
Platelets: 49 10*3/uL — ABNORMAL LOW (ref 150–400)
RBC: 2.57 MIL/uL — ABNORMAL LOW (ref 4.22–5.81)
RDW: 15.2 % (ref 11.5–15.5)
WBC: 2.2 10*3/uL — ABNORMAL LOW (ref 4.0–10.5)
nRBC: 0 % (ref 0.0–0.2)

## 2019-02-14 LAB — BASIC METABOLIC PANEL
Anion gap: 10 (ref 5–15)
BUN: 41 mg/dL — ABNORMAL HIGH (ref 8–23)
CO2: 23 mmol/L (ref 22–32)
Calcium: 8.7 mg/dL — ABNORMAL LOW (ref 8.9–10.3)
Chloride: 106 mmol/L (ref 98–111)
Creatinine, Ser: 2.07 mg/dL — ABNORMAL HIGH (ref 0.61–1.24)
GFR calc Af Amer: 37 mL/min — ABNORMAL LOW (ref >60)
GFR calc non Af Amer: 32 mL/min — ABNORMAL LOW (ref >60)
Glucose, Bld: 102 mg/dL — ABNORMAL HIGH (ref 70–99)
Potassium: 3.9 mmol/L (ref 3.5–5.1)
Sodium: 139 mmol/L (ref 135–145)

## 2019-02-14 LAB — BPAM PLATELET PHERESIS
Blood Product Expiration Date: 202004132359
Blood Product Expiration Date: 202004132359
ISSUE DATE / TIME: 202004120442
ISSUE DATE / TIME: 202004120943
Unit Type and Rh: 7300
Unit Type and Rh: 7300

## 2019-02-14 LAB — PREPARE PLATELET PHERESIS
Unit division: 0
Unit division: 0

## 2019-02-14 SURGERY — ESOPHAGOGASTRODUODENOSCOPY (EGD) WITH PROPOFOL
Anesthesia: General

## 2019-02-14 MED ORDER — NADOLOL 20 MG PO TABS
20.0000 mg | ORAL_TABLET | Freq: Every day | ORAL | Status: DC
  Filled 2019-02-14: qty 1

## 2019-02-14 MED ORDER — EPHEDRINE SULFATE-NACL 50-0.9 MG/10ML-% IV SOSY
PREFILLED_SYRINGE | INTRAVENOUS | Status: DC | PRN
  Administered 2019-02-14: 15 mg via INTRAVENOUS
  Administered 2019-02-14: 10 mg via INTRAVENOUS

## 2019-02-14 MED ORDER — FENTANYL CITRATE (PF) 250 MCG/5ML IJ SOLN
INTRAMUSCULAR | Status: DC | PRN
  Administered 2019-02-14: 100 ug via INTRAVENOUS

## 2019-02-14 MED ORDER — FUROSEMIDE 40 MG PO TABS: 40.0000 mg | ORAL_TABLET | Freq: Every day | ORAL | 0 refills | Status: DC

## 2019-02-14 MED ORDER — DEXAMETHASONE SODIUM PHOSPHATE 10 MG/ML IJ SOLN
INTRAMUSCULAR | Status: DC | PRN
  Administered 2019-02-14: 10 mg via INTRAVENOUS

## 2019-02-14 MED ORDER — PANTOPRAZOLE SODIUM 40 MG PO TBEC
40.0000 mg | DELAYED_RELEASE_TABLET | Freq: Every day | ORAL | Status: DC
  Administered 2019-02-14: 40 mg via ORAL
  Filled 2019-02-14: qty 1

## 2019-02-14 MED ORDER — SODIUM CHLORIDE 0.9 % IV SOLN
INTRAVENOUS | Status: DC | PRN
  Administered 2019-02-14: 100 ug/min via INTRAVENOUS

## 2019-02-14 MED ORDER — LIDOCAINE 2% (20 MG/ML) 5 ML SYRINGE
INTRAMUSCULAR | Status: DC | PRN
  Administered 2019-02-14: 60 mg via INTRAVENOUS

## 2019-02-14 MED ORDER — FENTANYL CITRATE (PF) 100 MCG/2ML IJ SOLN
INTRAMUSCULAR | Status: AC
  Filled 2019-02-14: qty 2

## 2019-02-14 MED ORDER — FOLIC ACID 1 MG PO TABS: 1.0000 mg | ORAL_TABLET | Freq: Every day | ORAL | 0 refills | Status: DC

## 2019-02-14 MED ORDER — NADOLOL 20 MG PO TABS: 20.0000 mg | ORAL_TABLET | Freq: Every day | ORAL | 0 refills | Status: DC

## 2019-02-14 MED ORDER — THIAMINE HCL 100 MG PO TABS: 100.0000 mg | ORAL_TABLET | Freq: Every day | ORAL | 0 refills | Status: DC

## 2019-02-14 MED ORDER — PROPOFOL 10 MG/ML IV BOLUS
INTRAVENOUS | Status: DC | PRN
  Administered 2019-02-14: 100 mg via INTRAVENOUS

## 2019-02-14 MED ORDER — ONDANSETRON HCL 4 MG/2ML IJ SOLN
INTRAMUSCULAR | Status: DC | PRN
  Administered 2019-02-14: 4 mg via INTRAVENOUS

## 2019-02-14 MED ORDER — ALBUMIN HUMAN 5 % IV SOLN
INTRAVENOUS | Status: DC | PRN
  Administered 2019-02-14: 10:00:00 via INTRAVENOUS

## 2019-02-14 MED ORDER — SUCCINYLCHOLINE CHLORIDE 200 MG/10ML IV SOSY
PREFILLED_SYRINGE | INTRAVENOUS | Status: DC | PRN
  Administered 2019-02-14: 80 mg via INTRAVENOUS

## 2019-02-14 MED ORDER — SODIUM CHLORIDE 0.9 % IV SOLN
INTRAVENOUS | Status: DC
  Administered 2019-02-14 (×2): via INTRAVENOUS

## 2019-02-14 MED ORDER — PANTOPRAZOLE SODIUM 40 MG PO TBEC: 40.0000 mg | DELAYED_RELEASE_TABLET | Freq: Every day | ORAL | 0 refills | Status: DC

## 2019-02-14 MED ORDER — PHENYLEPHRINE 40 MCG/ML (10ML) SYRINGE FOR IV PUSH (FOR BLOOD PRESSURE SUPPORT)
PREFILLED_SYRINGE | INTRAVENOUS | Status: DC | PRN
  Administered 2019-02-14 (×2): 120 ug via INTRAVENOUS
  Administered 2019-02-14: 40 ug via INTRAVENOUS
  Administered 2019-02-14: 120 ug via INTRAVENOUS

## 2019-02-14 MED FILL — FUROSEMIDE 40 MG TAB: 40 | 30 days supply | Qty: 30 | Fill #0

## 2019-02-14 MED FILL — VITAMIN B-1 100 MG TABLET: 100 | 90 days supply | Qty: 100 | Fill #0

## 2019-02-14 MED FILL — PANTOPRAZOLE SOD DR 40 MG T: 40 | 30 days supply | Qty: 30 | Fill #0

## 2019-02-14 MED FILL — FOLIC ACID 1 MG TABS: 1 | 30 days supply | Qty: 30 | Fill #0

## 2019-02-14 MED FILL — NADOLOL 20 MG TAB: 20 | 30 days supply | Qty: 30 | Fill #0

## 2019-02-14 SURGICAL SUPPLY — 14 items

## 2019-02-14 NOTE — Anesthesia Procedure Notes (Signed)
Procedure Name: Intubation Date/Time: 02/14/2019 9:29 AM Performed by: Imagene Riches, CRNA Pre-anesthesia Checklist: Patient identified, Emergency Drugs available, Suction available and Patient being monitored Patient Re-evaluated:Patient Re-evaluated prior to induction Oxygen Delivery Method: Circle System Utilized Preoxygenation: Pre-oxygenation with 100% oxygen Induction Type: IV induction Laryngoscope Size: Miller and 3 Grade View: Grade I Tube type: Oral Tube size: 7.5 mm Number of attempts: 1 Airway Equipment and Method: Stylet and Oral airway Placement Confirmation: ETT inserted through vocal cords under direct vision,  positive ETCO2 and breath sounds checked- equal and bilateral Secured at: 22 cm Tube secured with: Tape Dental Injury: Teeth and Oropharynx as per pre-operative assessment

## 2019-02-14 NOTE — Progress Notes (Signed)
US Abdomen this AM shows no ascites present.  Discussed with Dr. Tawanna Solo, cancel order for paracentesis at this time.   Brynda Greathouse, MS RD PA-C 9:39 AM

## 2019-02-14 NOTE — Transfer of Care (Signed)
Immediate Anesthesia Transfer of Care Note  Patient: Darren Mueller  Procedure(s) Performed: ESOPHAGOGASTRODUODENOSCOPY (EGD) WITH PROPOFOL (N/A )  Patient Location: PACU  Anesthesia Type:General  Level of Consciousness: awake, alert  and oriented  Airway & Oxygen Therapy: Patient Spontanous Breathing and Patient connected to face mask oxygen  Post-op Assessment: Report given to RN and Post -op Vital signs reviewed and stable  Post vital signs: Reviewed and stable  Last Vitals:  Vitals Value Taken Time  BP 136/93 02/14/2019 10:00 AM  Temp 36.5 C 02/14/2019 10:00 AM  Pulse 100 02/14/2019 10:03 AM  Resp 26 02/14/2019 10:03 AM  SpO2 93 % 02/14/2019 10:03 AM  Vitals shown include unvalidated device data.  Last Pain:  Vitals:   02/14/19 1000  TempSrc:   PainSc: 0-No pain      Patients Stated Pain Goal: 2 (83/50/75 7322)  Complications: No apparent anesthesia complications

## 2019-02-14 NOTE — Telephone Encounter (Signed)
See additional mychart message from 02/12/19

## 2019-02-14 NOTE — Anesthesia Postprocedure Evaluation (Signed)
Anesthesia Post Note  Patient: Darren Mueller  Procedure(s) Performed: ESOPHAGOGASTRODUODENOSCOPY (EGD) WITH PROPOFOL (N/A )     Patient location during evaluation: PACU Anesthesia Type: General Level of consciousness: awake and alert Pain management: pain level controlled Vital Signs Assessment: post-procedure vital signs reviewed and stable Respiratory status: spontaneous breathing, nonlabored ventilation and respiratory function stable Cardiovascular status: blood pressure returned to baseline and stable Postop Assessment: no apparent nausea or vomiting Anesthetic complications: no    Last Vitals:  Vitals:   02/14/19 1015 02/14/19 1029  BP: 122/79 113/74  Pulse: 86 96  Resp: 16 20  Temp:    SpO2: 95% 99%    Last Pain:  Vitals:   02/14/19 1015  TempSrc:   PainSc: 0-No pain                 Tashema Tiller,W. EDMOND

## 2019-02-14 NOTE — Addendum Note (Signed)
Addendum  created 02/14/19 1248 by Imagene Riches, CRNA   Charge Capture section accepted

## 2019-02-14 NOTE — Telephone Encounter (Signed)
appt made and pt notified

## 2019-02-14 NOTE — Care Management Obs Status (Signed)
[place] NOTIFICATION   Patient Details  Name: Darren Mueller MRN: [id number] Date of Birth: Jul 12, 1951   Medicare Observation Status Notification Given:  Yes    GEOVANI TOOTLE, [place] 02/14/2019, 1:39 PM

## 2019-02-14 NOTE — Discharge Summary (Signed)
Physician Discharge Summary  Darren Mueller ZOX:[id number] DOB: Nov 22, 1950 DOA: 02/12/2019  PCP: Mosie Lukes, MD  Admit date: 02/12/2019 Discharge date: 02/14/2019  Admitted From: Home Disposition:  Home  Discharge Condition:Stable CODE STATUS:FULL Diet recommendation: Heart Healthy  Brief/Interim Summary:  Patient is a 68 year old male with past medical history of alcoholic liver disease with ascites, pulmonary arterial hypertension, CKD stage III, chronic pancytopenia, hypertension, history of seizures, chronic alcohol abuse who presents to the emergency department after an episode of rectal bleeding.  Patient also reported of having black tarry stools last week.  Also had some abdominal discomfort.  Patient also reported that he has gained his weight significantly , developed bilateral lower extremity swelling and complained of mild dyspnea.  Had an episode of chest pain in the emergency department also. Patient admitted for the management of  GI bleed.  GI consulted .  He remained hemodynamically stable.  His hemoglobin remained stable.  No episodes of GI bleed after hospitalization.  Underwent EGD by GI with finding of Grade 1 and a small esophageal varices, no evidence of recent bleeding, portal hypertensive gastropathy. He is stable for discharge to home today.  Following problems were addressed during his hospitalization:  Hematochezia: Had an episode of frank rectal bleeding after having melena last week.  Cud be upper GI bleed associated with esophageal varices.  Hemodynamically stable on presentation.  Hemoglobin of 9 on presentation.  His hemoglobin was 11.1 in January of this year.  Platelets of 35,000.  He had done EGD in 2017 which showed small esophageal varices and colonoscopy in 2015 showed a polyp that was removed. Started  IV PPI and octreotide.  GI consulted.  No further bleeding after hospitalization.  Underwent EGD by GI.  Finding as above. Started on nadolol.   Recommended to continue PPI daily.  He will follow-up with GI as an outpatient.    Pancytopenia: Presented with thrombocytopenia.  Most likely associated with advanced cirrhosis.  He was transfused with platelets on admission. Follow-up CBC in a week.  Also has microcytic anemia, most likely asscoaited with alcoholism.  Continue folic acid.  Alcoholic cirrhosis: Secondary to ethanol abuse.  Patient also has history of severe right ventricular failure and pulmonary hypertension.  History of small esophageal varices as per EGD in May 2017.  INR of 1.4 on presentation, total bilirubin of 1.8.  Meld score of 18.  Chronic alcohol abuse: He still drinks but says he has significantly cut down.  He drinks 1 glass of vodka daily.  Counseled for cessation. Continue thiamine, folic acid.  Acute on chronic right ventricular failure/pulmonary hypertension: Presents with recent worsening of shortness and swelling of bilateral lower extremities.  Follows with Dr. Haroldine Laws, cardiology.  Started on Lasix IV 40 mg every 12.  Significant improvement in lower extremity edema today.  Chest x-ray on presentation showed cardiomegaly with vascular congestion. Continue home medications including Lasix and pulmonary hypertension meds.  Chest pain: Chest pain-free at present but complained of chest pain on presentation.  Mildly elevated troponin.  Most likely this is associated with his heart failure.  EKG did not show any ischemic changes.  COPD: Currently stable.  No cough or wheezing.  Continue home inhalers  AKI on CKD stage III:  Creatinine slightly bumped up today.  Will discontinue losartan, he is normotensive.  He follows with [facility].  History of seizures: Not on Keppra anymore.   Discharge Diagnoses:  Principal Problem:   Rectal bleeding Active Problems:   Hypertension  Hepatic cirrhosis (HCC)   PAH (pulmonary arterial hypertension) with portal hypertension (HCC)   Hypothyroidism    Right heart failure due to pulmonary hypertension (HCC)   Chronic renal insufficiency, stage III (moderate) (HCC)   Pancytopenia (HCC)   Cirrhosis with alcoholism (HCC)   Chest pain   Thrombocytopenia (HCC)   Seizure disorder (HCC)   COPD (chronic obstructive pulmonary disease) (HCC)   Acute on chronic right heart failure (HCC)   Hematochezia   Ascites   Dark stools   Alcoholism ([place])    Discharge Instructions  Discharge Instructions    Diet - low sodium heart healthy   Complete by:  As directed    Discharge instructions   Complete by:  As directed    1)Take prescribed medications as instructed. 2)Follow up with your PCP in a week.  Do a CBC, BMP test during the follow-up 3)You will be called by gastroenterology office for the follow-up appointment. 4)Please stop alcohol consumption. 5)Follow up with your cardiologist.   Increase activity slowly   Complete by:  As directed      Allergies as of 02/14/2019      Reactions   Aspirin Other (See Comments)   hypertension   Nitroglycerin    Headache      Medication List    STOP taking these medications   losartan 50 MG tablet Commonly known as:  COZAAR   omeprazole 40 MG capsule Commonly known as:  PRILOSEC     TAKE these medications   Albuterol Sulfate 108 (90 Base) MCG/ACT Aepb Commonly known as:  ProAir RespiClick Inhale 2 puffs into the lungs every 6 (six) hours as needed (SOB or wheezing).   allopurinol 100 MG tablet Commonly known as:  ZYLOPRIM Take 2 tablets (200 mg total) by mouth daily.   azelastine 0.1 % nasal spray Commonly known as:  ASTELIN Place 2 sprays into both nostrils 2 (two) times daily. Use in each nostril as directed What changed:    when to take this  reasons to take this  additional instructions   cholestyramine 4 g packet Commonly known as:  Questran Take 1 packet (4 g total) by mouth 2 (two) times daily.   colchicine 0.6 MG tablet Take 1 tablet (0.6 mg total) by mouth 2 (two)  times daily as needed. What changed:  when to take this   diphenoxylate-atropine 2.5-0.025 MG tablet Commonly known as:  LOMOTIL Take 1 tablet by mouth 3 (three) times daily. What changed:    when to take this  reasons to take this   fluticasone 50 MCG/ACT nasal spray Commonly known as:  FLONASE Place 2 sprays into both nostrils daily. What changed:    when to take this  reasons to take this   fluticasone-salmeterol 230-21 MCG/ACT inhaler Commonly known as:  ADVAIR HFA Inhale 2 puffs into the lungs every morning.   folic acid 1 MG tablet Commonly known as:  FOLVITE Take 1 tablet (1 mg total) by mouth daily. Start taking on:  February 15, 2019   furosemide 40 MG tablet Commonly known as:  LASIX Take 1 tablet (40 mg total) by mouth daily. Start taking on:  February 15, 2019   gatifloxacin 0.5 % Soln Commonly known as:  ZYMAXID Place 1 drop into the right eye daily.   hyoscyamine 0.125 MG SL tablet Commonly known as:  LEVSIN SL Place 1 tablet (0.125 mg total) under the tongue every 6 (six) hours as needed. What changed:  reasons to take this  ipratropium 17 MCG/ACT inhaler Commonly known as:  ATROVENT HFA Inhale 2 puffs into the lungs every 6 (six) hours as needed for wheezing.   macitentan 10 MG tablet Commonly known as:  Opsumit Take 1 tablet (10 mg total) by mouth daily.   nadolol 20 MG tablet Commonly known as:  CORGARD Take 1 tablet (20 mg total) by mouth daily. Start taking on:  February 15, 2019   pantoprazole 40 MG tablet Commonly known as:  PROTONIX Take 1 tablet (40 mg total) by mouth daily before breakfast. Start taking on:  February 15, 2019   potassium chloride 10 MEQ tablet Commonly known as:  K-DUR Take 2 tablets (20 mEq total) by mouth daily.   prednisoLONE acetate 1 % ophthalmic suspension Commonly known as:  PRED FORTE Place 1 drop into the right eye daily.   rosuvastatin 5 MG tablet Commonly known as:  CRESTOR Take 1 tablet (5 mg total) by  mouth daily.   Selexipag 1000 MCG Tabs Commonly known as:  Uptravi Take 1,000 mcg by mouth 2 (two) times daily.   spironolactone 50 MG tablet Commonly known as:  ALDACTONE TAKE 1 TABLET DAILY   tadalafil (PAH) 20 MG tablet Commonly known as:  ADCIRCA TAKE 2 TABLETS (40 MG TOTAL) DAILY What changed:  See the new instructions.   thiamine 100 MG tablet Take 1 tablet (100 mg total) by mouth daily. Start taking on:  February 15, 2019   tiZANidine 2 MG tablet Commonly known as:  ZANAFLEX Take 0.5-2 tablets (1-4 mg total) by mouth every 6 (six) hours as needed for muscle spasms.      Follow-up Information    Mosie Lukes, MD. Schedule an appointment as soon as possible for a visit in 1 week(s).   Specialty:  Family Medicine Contact information: [address] [place] [address] [phone]          Allergies  Allergen Reactions  . Aspirin Other (See Comments)    hypertension  . Nitroglycerin     Headache    Consultations:  GI   Procedures/Studies: Dg Chest Port 1 View  Result Date: 02/13/2019 CLINICAL DATA:  Initial evaluation for acute chest pain, shortness of breath. EXAM: PORTABLE CHEST 1 VIEW COMPARISON:  Prior radiograph from 09/27/2018. FINDINGS: Cardiomegaly, stable. Loop recorder overlies the left heart, also unchanged. Mediastinal silhouette within normal limits. Lungs normally inflated. Mild perihilar vascular congestion without overt pulmonary edema. No definite pleural effusion. No consolidative opacity. No pneumothorax. No acute osseous finding. IMPRESSION: 1. Cardiomegaly with mild perihilar vascular congestion without overt pulmonary edema. 2. No other active cardiopulmonary disease. Electronically Signed   By: Jeannine Boga M.D.   On: 02/13/2019 00:55   US Abdomen Limited Ruq  Result Date: 02/14/2019 CLINICAL DATA:  Hepatic cirrhosis. EXAM: ULTRASOUND ABDOMEN LIMITED RIGHT UPPER QUADRANT COMPARISON:  11/11/2018 FINDINGS:  Gallbladder: No gallstones or wall thickening visualized. No sonographic Murphy sign noted by sonographer. Common bile duct: Diameter: 5 mm, within normal limits. Liver: Diffuse coarsening of hepatic echotexture and capsular nodularity is again seen, consistent with cirrhosis. No liver mass visualized. No significant ascites visualized. Portal vein is patent on color Doppler imaging with normal direction of blood flow towards the liver. IMPRESSION: Hepatic cirrhosis.  No evidence of liver mass. No evidence of cholelithiasis or biliary ductal dilatation. Electronically Signed   By: Earle Gell M.D.   On: 02/14/2019 07:56       Subjective: Patient seen and examined the bedside this  morning.  Remains hemodynamically stable.  Comfortable.  EGD findings as above.  Stable for discharge.  Communicated with patient about discharge instruction.  Discharge Exam: Vitals:   02/14/19 1029 02/14/19 1123  BP: 113/74 114/67  Pulse: 96 98  Resp: 20 (!) 27  Temp:    SpO2: 99% 95%   Vitals:   02/14/19 1000 02/14/19 1015 02/14/19 1029 02/14/19 1123  BP: (!) 136/93 122/79 113/74 114/67  Pulse: 98 86 96 98  Resp: _0 (!) 27  Temp: 97.7 F (36.5 C)     TempSrc:      SpO2: 100% 95% 99% 95%  Weight:      Height:        General: Pt is alert, awake, not in acute distress Cardiovascular: RRR, S1/S2 +, no rubs, no gallops Respiratory: CTA bilaterally, no wheezing, no rhonchi Abdominal: Soft, NT, ND, bowel sounds + Extremities: Mild lower extremity edema, no cyanosis    The results of significant diagnostics from this hospitalization (including imaging, microbiology, ancillary and laboratory) are listed below for reference.     Microbiology: Recent Results (from the past 240 hour(s))  MRSA PCR Screening     Status: None   Collection Time: 02/13/19  3:14 AM  Result Value Ref Range Status   MRSA by PCR NEGATIVE NEGATIVE Final    Comment:        The GeneXpert MRSA Assay (FDA approved for NASAL  specimens only), is one component of a comprehensive MRSA colonization surveillance program. It is not intended to diagnose MRSA infection nor to guide or monitor treatment for MRSA infections. Performed at [facility], [place] [address] [place], [place] [address]      Labs: BNP (last 3 results) Recent Labs    07/22/18 1255 02/12/19 2356  BNP 264.2* 728.9*   Basic Metabolic Panel: Recent Labs  Lab 02/12/19 2356 02/13/19 0507 02/14/19 0314  NA 138 140 139  K 4.0 3.8 3.9  CL 109 107 106  CO2 19* 19* 23  GLUCOSE 99 75 102*  BUN 38* 42* 41*  CREATININE 1.78* 1.74* 2.07*  CALCIUM 8.4* 8.6* 8.7*  MG  --  1.6*  --    Liver Function Tests: Recent Labs  Lab 02/12/19 2356 02/13/19 0507  AST 55* 53*  ALT 22 23  ALKPHOS 244* 226*  BILITOT 1.8* 1.8*  PROT 6.0* 6.2*  ALBUMIN 2.9* 2.9*   No results for input(s): LIPASE, AMYLASE in the last 168 hours. Recent Labs  Lab 02/12/19 2356  AMMONIA 32   CBC: Recent Labs  Lab 02/12/19 2356 02/13/19 0507 02/13/19 1550 02/14/19 0314  WBC 3.4* 3.0* 2.0* 2.2*  NEUTROABS 2.2  --   --  1.1*  HGB 9.1* 9.0* 7.8* 8.8*  HCT 29.1* 28.1* 24.8* 27.4*  MCV 112.8* 106.4* 109.3* 106.6*  PLT 35* 43* 39* 49*   Cardiac Enzymes: Recent Labs  Lab 02/12/19 2356 02/13/19 0507 02/13/19 1008  TROPONINI <0.03 0.04* 0.03*   BNP: Invalid input(s): POCBNP CBG: No results for input(s): GLUCAP in the last 168 hours. D-Dimer No results for input(s): DDIMER in the last 72 hours. Hgb A1c No results for input(s): HGBA1C in the last 72 hours. Lipid Profile No results for input(s): CHOL, HDL, LDLCALC, TRIG, CHOLHDL, LDLDIRECT in the last 72 hours. Thyroid function studies No results for input(s): TSH, T4TOTAL, T3FREE, THYROIDAB in the last 72 hours.  Invalid input(s): FREET3 Anemia work up No results for input(s): VITAMINB12, FOLATE, FERRITIN, TIBC, IRON, RETICCTPCT in  the last 72 hours. Urinalysis    Component Value Date/Time    COLORURINE YELLOW 11/18/2017 0250   APPEARANCEUR CLEAR 11/18/2017 0250   LABSPEC 1.008 11/18/2017 0250   PHURINE 5.0 11/18/2017 0250   GLUCOSEU NEGATIVE 11/18/2017 0250   HGBUR NEGATIVE 11/18/2017 0250   BILIRUBINUR neg 12/18/2017 1352   KETONESUR NEGATIVE 11/18/2017 0250   PROTEINUR net 12/18/2017 1352   PROTEINUR NEGATIVE 11/18/2017 0250   UROBILINOGEN 0.2 12/18/2017 1352   UROBILINOGEN 0.2 10/18/2013 1247   NITRITE neg 12/18/2017 1352   NITRITE NEGATIVE 11/18/2017 0250   LEUKOCYTESUR Negative 12/18/2017 1352   Sepsis Labs Invalid input(s): PROCALCITONIN,  WBC,  LACTICIDVEN Microbiology Recent Results (from the past 240 hour(s))  MRSA PCR Screening     Status: None   Collection Time: 02/13/19  3:14 AM  Result Value Ref Range Status   MRSA by PCR NEGATIVE NEGATIVE Final    Comment:        The GeneXpert MRSA Assay (FDA approved for NASAL specimens only), is one component of a comprehensive MRSA colonization surveillance program. It is not intended to diagnose MRSA infection nor to guide or monitor treatment for MRSA infections. Performed at [facility], [place] [address], [place], [place] [address]     Please note: You were cared for by a hospitalist during your hospital stay. Once you are discharged, your primary care physician will handle any further medical issues. Please note that NO REFILLS for any discharge medications will be authorized once you are discharged, as it is imperative that you return to your primary care physician (or establish a relationship with a primary care physician if you do not have one) for your post hospital discharge needs so that they can reassess your need for medications and monitor your lab values.    Time coordinating discharge: 40 minutes  SIGNED:   Shelly Coss, MD  [facility] 02/14/2019, 11:39 AM Pager [phone]  If 7PM-7AM, please contact night-coverage [email or web address] Password [email or web address]

## 2019-02-14 NOTE — Telephone Encounter (Signed)
Dr Charlett Blake -- pt was admitted to hospitall on 02/12/19

## 2019-02-14 NOTE — TOC Initial Note (Signed)
Transition of Care [facility]) - Initial/Assessment Note    Patient Details  Name: Darren Mueller MRN: [id number] Date of Birth: [birth year]  [facility] CM/SW Contact:    Maryclare Labrador, RN Phone Number: 02/14/2019, 1:08 PM  Clinical Narrative:    PTA independent from home, pt has PCP and denied barriers with paying for discharge medications.  Pt educated on importance of daily weights and low salt diet.  NO CM needs determined at time of discharge               Expected Discharge Plan: Home/Self Care Barriers to Discharge: Barriers Resolved   Patient Goals and CMS Choice        Expected Discharge Plan and Services Expected Discharge Plan: Home/Self Care         Expected Discharge Date: 02/14/19                        Prior Living Arrangements/Services       Do you feel safe going back to the place where you live?: Yes               Activities of Daily Living Home Assistive Devices/Equipment: None ADL Screening (condition at time of admission) Patient's cognitive ability adequate to safely complete daily activities?: Yes Is the patient deaf or have difficulty hearing?: No Does the patient have difficulty seeing, even when wearing glasses/contacts?: No Does the patient have difficulty concentrating, remembering, or making decisions?: No Patient able to express need for assistance with ADLs?: Yes Does the patient have difficulty dressing or bathing?: No Independently performs ADLs?: Yes (appropriate for developmental age) Does the patient have difficulty walking or climbing stairs?: Yes Weakness of Legs: None Weakness of Arms/Hands: None  Permission Sought/Granted Permission sought to share information with : Case Manager                Emotional Assessment     Affect (typically observed): Accepting, Adaptable Orientation: : Oriented to Self, Oriented to Place, Oriented to Situation   Psych Involvement: No (comment)  Admission  diagnosis:  Rectal bleeding [K62.5] Acute on chronic right-sided heart failure ([place]) [I50.813] Patient Active Problem List   Diagnosis Date Noted  . Rectal bleeding 02/13/2019  . COPD (chronic obstructive pulmonary disease) ([place]) 02/13/2019  . Acute on chronic right heart failure ([place]) 02/13/2019  . Hematochezia   . Ascites   . Dark stools   . Alcoholism ([place])   . Hyperglycemia 05/04/2018  . Seizure disorder ([place]) 02/01/2018  . Pedal edema 10/01/2017  . RUQ pain 05/06/2017  . Decreased range of motion of left elbow 04/15/2017  . Hyperlipidemia 03/17/2017  . Elbow pain, left 03/17/2017  . Right rib fracture   . Syncope 07/19/2016  . Thrombocytopenia ([place]) 06/29/2016  . Hoarseness, chronic 06/29/2016  . Preventative health care 06/29/2016  . Chest pain   . Low back pain 12/09/2015  . Cirrhosis with alcoholism ([place]) 08/08/2015  . Pancytopenia ([place]) 06/10/2015  . Dermatitis 06/10/2015  . OSA (obstructive sleep apnea) 02/13/2015  . Gout 07/30/2014  . Diarrhea 09/04/2013  . Chronic renal insufficiency, stage III (moderate) ([place]) 07/18/2013  . Right heart failure due to pulmonary hypertension ([place]) 06/06/2013  . Hypothyroidism 01/25/2013  . PAH (pulmonary arterial hypertension) with portal hypertension ([place]) 01/21/2013  . Allergic rhinitis 12/21/2012  . Secondary pulmonary hypertension 12/09/2012  . Hepatic cirrhosis ([place]) 11/26/2012  . GERD (gastroesophageal reflux disease) 09/05/2011  . Hypokalemia 08/29/2011  . Colon  polyps 03/26/2011  . Insomnia 03/26/2011  . Hypertension 03/22/2011  . Alcohol dependence ([place]) 03/22/2011   PCP:  Mosie Lukes, MD Pharmacy:   [place], [place] [address] [place] [address] Phone: [phone] Fax: [phone]  [facility] [place], [place] [address] [place] [address] Phone: [phone] Fax:  [phone]  [place], [place] [address] Phone: [phone] Fax: [phone]     Social Determinants of Health (SDOH) Interventions    Readmission Risk Interventions No flowsheet data found.

## 2019-02-14 NOTE — Op Note (Signed)
[facility] Patient Name: Darren Mueller Procedure Date : 02/14/2019 MRN: [id number] Attending MD: Jerene Bears , MD Date of Birth: February 18, 1951 CSN: [id number] Age: 68 Admit Type: Inpatient Procedure:                Upper GI endoscopy Indications:              Possible melena, known small esophageal varices                            (seen in 2017), Follow-up of esophageal varices,                            alcoholic cirrhosis with active alcohol use Providers:                Lajuan Lines. Hilarie Fredrickson, MD, Elna Breslow, RN, William Dalton, Technician, Marguerita Merles, Technician Referring MD:             [facility] Medicines:                General Anesthesia Complications:            No immediate complications. Estimated Blood Loss:     Estimated blood loss: none. Procedure:                Pre-Anesthesia Assessment:                           - Prior to the procedure, a History and Physical                            was performed, and patient medications and                            allergies were reviewed. The patient's tolerance of                            previous anesthesia was also reviewed. The risks                            and benefits of the procedure and the sedation                            options and risks were discussed with the patient.                            All questions were answered, and informed consent                            was obtained. Prior Anticoagulants: The patient has                            taken no previous anticoagulant or antiplatelet  agents. ASA Grade Assessment: III - A patient with                            severe systemic disease. After reviewing the risks                            and benefits, the patient was deemed in                            satisfactory condition to undergo the procedure.                           After obtaining informed consent,  the endoscope was                            passed under direct vision. Throughout the                            procedure, the patient's blood pressure, pulse, and                            oxygen saturations were monitored continuously. The                            GIF-H190 ([id number]) Olympus gastroscope was                            introduced through the mouth, and advanced to the                            second part of duodenum. The upper GI endoscopy was                            accomplished without difficulty. The patient                            tolerated the procedure well. Scope In: Scope Out: Findings:      Grade I, small (< 5 mm) varices were found in the lower third of the       esophagus. There was no stigmata of recent bleeding.      A small hiatal hernia was present.      Mild portal hypertensive gastropathy was found in the cardia, in the       gastric fundus and in the gastric body.      The exam of the stomach was otherwise normal.      The examined duodenum was normal. Impression:               - Grade I and small (< 5 mm) esophageal varices. No                            evidence of recent bleeding.                           - Small hiatal hernia.                           -  Portal hypertensive gastropathy.                           - Normal examined duodenum.                           - No specimens collected. Moderate Sedation:      N/A Recommendation:           - Return patient to hospital ward for ongoing care.                           - Resume previous diet.                           - Non-selective beta blocker is recommended for                            varices and portal gastropathy. Nadolol 20 mg                            daily, titrate to resting HR 60 bpm as tolerated.                           - Alcohol abstinence is the most important                            intervention. We have discussed this today and I                             explained that his life expectancy will be                            significantly shorter if he continues to drink                            alcohol.                           - RUQ [place] negative for lesions to suggest [place].                           - Ascites not appreciable on examination (and [place]                            was limited). Okay to stop prophylactic antibiotic                            at this time.                           - Discontinue octreotide gtt.                           - Daily PPI.                           -  Outpatient follow-up with Dr. Ardis Hughs strongly                            recommended (patients seems to understand and is                            willing to follow-up). Procedure Code(s):        --- Professional ---                           [phone], Esophagogastroduodenoscopy, flexible,                            transoral; diagnostic, including collection of                            specimen(s) by brushing or washing, when performed                            (separate procedure) Diagnosis Code(s):        --- Professional ---                           I85.00, Esophageal varices without bleeding                           K44.9, Diaphragmatic hernia without obstruction or                            gangrene                           K76.6, Portal hypertension                           K31.89, Other diseases of stomach and duodenum                           K92.1, Melena (includes Hematochezia) CPT copyright 2019 American [employer] Association. All rights reserved. The codes documented in this report are preliminary and upon coder review may  be revised to meet current compliance requirements. Jerene Bears, MD 02/14/2019 10:22:47 AM This report has been signed electronically. Number of Addenda: 0

## 2019-02-14 NOTE — Progress Notes (Signed)
Explained pt regarding EGD result, no active bleeding. US abdomen this morning doesn't show ascites, so d/c paracentesis order. Pt stated that he was dizzy when he tried to use urinal. Checked V/S stable. Pt felt better than before. Removed PIV access x 2 and pt received discharge instructions and understood it. Applied ted hose BLE. Pt took his all belongings. HS [employer]

## 2019-02-14 NOTE — Telephone Encounter (Signed)
-----  Message from Jerene Bears, MD sent at 02/14/2019 10:28 AM EDT ----- Hi all, This is a DJ patient with ETOH cirrhosis. EGD today Needs follow-up within a few weeks. Thanks [employer]

## 2019-02-15 ENCOUNTER — Telehealth: Payer: Self-pay

## 2019-02-15 NOTE — Telephone Encounter (Signed)
appt scheduled pt aware

## 2019-02-15 NOTE — Telephone Encounter (Signed)
-----  Message from Milus Banister, MD sent at 02/15/2019  6:17 AM EDT ----- Thanks [employer]im[employer], ROV in 3-4 weeks, starting with virtual.  Thanks  ----- Message ----- From: Jerene Bears, MD Sent: 02/14/2019  10:28 AM EDT To: Milus Banister, MD, Timothy Lasso, RN  Hi all, This is a DJ patient with ETOH cirrhosis. EGD today Needs follow-up within a few weeks. Thanks [employer]

## 2019-02-15 NOTE — Telephone Encounter (Signed)
Copied from [place] [id number]. Topic: Appointment Scheduling - Scheduling Inquiry for Clinic >> Feb 15, 2019  8:39 AM Berneta Levins wrote: Reason for CRM:   Aldona Bar, case manger from [facility] called and left message on [place] 02/14/2019 requesting that pt have a one week hospital follow up.  Aldona Bar can be reached at [phone].

## 2019-02-16 ENCOUNTER — Telehealth: Payer: Self-pay

## 2019-02-16 NOTE — Telephone Encounter (Signed)
Called patient appointment had already been scheduled. Did hospital review with patient and sent to Dr. Charlett Blake.

## 2019-02-16 NOTE — Telephone Encounter (Signed)
02/16/19  Transition Care Management Follow-up Telephone Call  ADMISSION DATE: 02/12/19  DISCHARGE DATE: 02/14/19   How have you been since you were released from the hospital? Not feeling well. Dizziness and stomach pain.   Do you understand why you were in the hospital? Yes   Do you understand the discharge instrcutions? No. Rushed per patient.    Items Reviewed:  Medications reviewed: Yes   Allergies reviewed: Yes  Dietary changes reviewed: Low sodium heart healthy.   Referrals reviewed: Cardiology, Nephrology and Gastrology   Functional Questionnaire:   Activities of Daily Living (ADLs): Patient states he can perform all independently.  Any patient concerns? Increase in diziness   Confirmed importance and date/time of follow-up visits scheduled: Yes   Confirmed with patient if condition begins to worsen call PCP or go to the ER. Yes    Patient was given the office number and encouragred to call back with questions or concerns. Yes

## 2019-02-16 NOTE — Telephone Encounter (Signed)
He needs a hospital follow up. Today at 2 maybe or Friday at 12:20?

## 2019-02-18 ENCOUNTER — Ambulatory Visit: Payer: Medicare Other | Admitting: Oncology

## 2019-02-18 ENCOUNTER — Other Ambulatory Visit: Payer: Medicare Other

## 2019-02-18 ENCOUNTER — Telehealth: Payer: Self-pay

## 2019-02-18 ENCOUNTER — Encounter: Payer: Self-pay | Admitting: Family Medicine

## 2019-02-18 ENCOUNTER — Telehealth (HOSPITAL_COMMUNITY): Payer: Self-pay | Admitting: *Deleted

## 2019-02-18 NOTE — Telephone Encounter (Signed)
Pt said he was prescribed nadolol when he was discharged from the hospital he wants to make sure our office is agreeable with him taking this medication. Please advise

## 2019-02-18 NOTE — Telephone Encounter (Signed)
Patient called in to office. States he is having problems getting to sleep at night requesting medication to help. States a medication he was given before did not work.  Patient has Hospital follow up visit scheduled for 02/21/19

## 2019-02-20 NOTE — Telephone Encounter (Signed)
Copied from [place] [id number]. Topic: General - Other >> Feb 17, 2019 11:48 AM Celene Kras A wrote: Reason for CRM: Pt is requesting a sleep medication. Pt states he has not been able to sleep since he got discharged from hospital. Pt states a medication he took before for sleep does not work for him. Pt could not remember the name. Please advise.

## 2019-02-21 ENCOUNTER — Inpatient Hospital Stay: Payer: Medicare Other | Admitting: Family Medicine

## 2019-02-21 ENCOUNTER — Ambulatory Visit (INDEPENDENT_AMBULATORY_CARE_PROVIDER_SITE_OTHER): Payer: Medicare Other | Admitting: Family Medicine

## 2019-02-21 ENCOUNTER — Other Ambulatory Visit: Payer: Self-pay

## 2019-02-21 DIAGNOSIS — D649 Anemia, unspecified: Secondary | ICD-10-CM

## 2019-02-21 DIAGNOSIS — R51 Headache: Secondary | ICD-10-CM

## 2019-02-21 DIAGNOSIS — N183 Chronic kidney disease, stage 3 unspecified: Secondary | ICD-10-CM

## 2019-02-21 DIAGNOSIS — F1029 Alcohol dependence with unspecified alcohol-induced disorder: Secondary | ICD-10-CM

## 2019-02-21 DIAGNOSIS — K7030 Alcoholic cirrhosis of liver without ascites: Secondary | ICD-10-CM

## 2019-02-21 DIAGNOSIS — K625 Hemorrhage of anus and rectum: Secondary | ICD-10-CM

## 2019-02-21 DIAGNOSIS — I10 Essential (primary) hypertension: Secondary | ICD-10-CM

## 2019-02-21 DIAGNOSIS — R519 Headache, unspecified: Secondary | ICD-10-CM | POA: Insufficient documentation

## 2019-02-21 NOTE — Assessment & Plan Note (Addendum)
No further episodes since patient was discharged home we will check CMP, CBC with diff, retic count, ferritin level. He had documented esophageal varices noted on EGD. He is advised that he has to stay away from alcohol to diminish his risk or recurrence. He did not tolerate Nadolol with light headedness but feels better since stopping. He has a follow up appointment with gastroenterology soon. He will seek care if it recurs.

## 2019-02-21 NOTE — Assessment & Plan Note (Signed)
Hydrate and monitor 

## 2019-02-21 NOTE — Assessment & Plan Note (Signed)
Well controlled, no changes to meds. Encouraged heart healthy diet such as the DASH diet and exercise as tolerated.

## 2019-02-21 NOTE — Assessment & Plan Note (Signed)
Has struggled with headache, frontal daily since being hospitalized. Encouraged increased hydration, 64 ounces of clear fluids daily. Minimize alcohol and caffeine. Eat small frequent meals with lean proteins and complex carbs. Avoid high and low blood sugars. Get adequate sleep, 7-8 hours a night. Needs exercise daily preferably in the morning. Is not a good candidate for NSAIDs, Tylenol or narcotics. Try ice and topical menthol and notify us if worsens.

## 2019-02-21 NOTE — Progress Notes (Signed)
Virtual Visit via Video Note  I connected with Arnette Norris on 02/21/19 at  1:40 PM EDT by a video enabled telemedicine application and verified that I am speaking with the correct person using two identifiers.   I discussed the limitations of evaluation and management by telemedicine and the availability of in person appointments. The patient expressed understanding and agreed to proceed. Magdalene Molly, CMA was able to get patient set up on Video platform.     Subjective:    Patient ID: Darren Mueller, male    DOB: 13-Feb-1951, 68 y.o.   MRN: [id number]  No chief complaint on file.   HPI Patient is in today for follow up after a recent hospitalization for GI bleed in which he required transfusion with 2 units of PRBC. He was sent home on Nadolol for his esophageal varices but he did not tolerate it. It caused lightheadedness and he just did not feel well so he stopped it and he feels much better. He has no further rectal bleeding and no melena since returning home. Generally does not tolerate OTC iron supplements but is taking an MVI. He continues to struggle with increased pedal edema. He has not drunk any alcohol for about a week and he denies any seizures, shaking, n/v or concerning symptoms.   Past Medical History:  Diagnosis Date   Alcohol dependence ([place]) 03/22/2011   In remission    Alcohol dependence in remission ([place]) 03/22/2011   In remission    Alcoholic cirrhosis of liver with ascites ([place]) 2014   Anginal pain (HCC)    CHF (congestive heart failure) (HCC)    COPD (chronic obstructive pulmonary disease) ([place])    Coronary artery disease    Dermatitis 06/10/2015   Diarrhea 09/04/2013   Dyspnea    Elbow pain, left 03/17/2017   GERD (gastroesophageal reflux disease)    Gout    Headache    Heart murmur    Hx of colonic polyps    Hypertension    Hypertriglyceridemia 03/17/2017   Hypopotassemia    Otalgia 11/28/2013   Other chronic pulmonary heart  diseases    Preventative health care 06/29/2016   Red eye 03/03/2016   right   Renal insufficiency 07/18/2013   Seizures ([place])    Sleep apnea    does not wear CPAP   Thrombocytopenia ([place]) 06/29/2016   Unspecified hypothyroidism 01/25/2013   Unspecified pleural effusion     Past Surgical History:  Procedure Laterality Date   CARDIAC CATHETERIZATION N/A 12/21/2015   Procedure: Right Heart Cath;  Surgeon: Jolaine Artist, MD;  Location: [facility];  Service: Cardiovascular;  Laterality: N/A;   CARDIAC CATHETERIZATION N/A 04/15/2016   Procedure: Right/Left Heart Cath and Coronary Angiography;  Surgeon: Jolaine Artist, MD;  Location: [facility];  Service: Cardiovascular;  Laterality: N/A;   COLONOSCOPY N/A 12/08/2013   Procedure: COLONOSCOPY;  Surgeon: Milus Banister, MD;  Location: [facility];  Service: Endoscopy;  Laterality: N/A;   ESOPHAGOGASTRODUODENOSCOPY (EGD) WITH PROPOFOL N/A 03/27/2016   Procedure: ESOPHAGOGASTRODUODENOSCOPY (EGD) WITH PROPOFOL;  Surgeon: Milus Banister, MD;  Location: [facility];  Service: Endoscopy;  Laterality: N/A;   ESOPHAGOGASTRODUODENOSCOPY (EGD) WITH PROPOFOL N/A 02/14/2019   Procedure: ESOPHAGOGASTRODUODENOSCOPY (EGD) WITH PROPOFOL;  Surgeon: Jerene Bears, MD;  Location: [facility] ENDOSCOPY;  Service: Gastroenterology;  Laterality: N/A;   LOOP RECORDER INSERTION N/A 01/01/2017   Procedure: Loop Recorder Insertion;  Surgeon: Thompson Grayer, MD;  Location: [place] [facility];  Service: Cardiovascular;  Laterality: N/A;   RIGHT HEART CATHETERIZATION Right 06/06/2014   Procedure: RIGHT HEART CATH;  Surgeon: Jolaine Artist, MD;  Location: [facility] CATH LAB;  Service: Cardiovascular;  Laterality: Right;   UVULOPALATOPHARYNGOPLASTY  1999    Family History  Problem Relation Age of Onset   Heart disease Mother    Heart attack Mother    Hypertension Mother    Kidney failure Mother    Liver disease Mother        stage 93 liver disease    Prostate cancer Father    Cancer Father        lung   Hypertension Brother    Gout Brother    Alcoholism Maternal Grandfather    Liver disease Maternal Grandfather    Migraines Sister    Hypertension Brother    Colon cancer Neg Hx     Social History   Socioeconomic History   Marital status: Divorced    Spouse name: Not on file   Number of children: 3   Years of education: Not on file   Highest education level: Not on file  Occupational History   Occupation: Retired    Hydrologist: Research officer, 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 [employer] insecurity:    Worry: Never true    Inability: Never true   Transportation needs:    Medical: No    Non-medical: No  Tobacco Use   Smoking status: Former Smoker    Packs/day: 2.00    Years: 5.00    Pack years: 10.00    Types: Cigarettes    Last attempt to quit: 09/22/1979    Years since quitting: 39.4   Smokeless tobacco: Never Used  Substance and Sexual Activity   Alcohol use: Yes    Alcohol/week: 6.0 standard drinks    Types: 6 Shots of liquor per week    Comment: social drinker   Drug use: No   Sexual activity: Yes    Birth control/protection: Spermicide  Lifestyle   Physical activity:    Days per week: 0 days    Minutes per session: 0 min   Stress: Rather much  Relationships   Social connections:    Talks on phone: More than three times a week    Gets together: Three times a week    Attends religious service: More than 4 times per year    Active member of club or organization: Yes    Attends meetings of clubs or organizations: More than 4 times per year    Relationship status: Divorced   Intimate partner violence:    Fear of current or ex partner: Not on file    Emotionally abused: Not on file    Physically abused: Not on file    Forced sexual activity: Not on file  Other Topics Concern   Not on file  Social History Narrative   0 caffeine drinks daily      Outpatient Medications Prior to Visit  Medication Sig Dispense Refill   Albuterol Sulfate (PROAIR RESPICLICK) 509 (90 Base) MCG/ACT AEPB Inhale 2 puffs into the lungs every 6 (six) hours as needed (SOB or wheezing). 3 each 1   allopurinol (ZYLOPRIM) 100 MG tablet Take 2 tablets (200 mg total) by mouth daily. (Patient not taking: Reported on 02/13/2019) 180 tablet 1   azelastine (ASTELIN) 0.1 % nasal spray Place 2 sprays into both nostrils 2 (two) times daily. Use in each nostril as directed (Patient taking differently: Place  2 sprays into both nostrils daily as needed for rhinitis or allergies. ) 30 mL 1   cholestyramine (QUESTRAN) 4 g packet Take 1 packet (4 g total) by mouth 2 (two) times daily. (Patient not taking: Reported on 02/13/2019) 60 each 12   colchicine 0.6 MG tablet Take 1 tablet (0.6 mg total) by mouth 2 (two) times daily as needed. (Patient taking differently: Take 0.6 mg by mouth daily. ) 90 tablet 1   diphenoxylate-atropine (LOMOTIL) 2.5-0.025 MG tablet Take 1 tablet by mouth 3 (three) times daily. (Patient taking differently: Take 1 tablet by mouth 4 (four) times daily as needed for diarrhea or loose stools. ) 60 tablet 0   fluticasone (FLONASE) 50 MCG/ACT nasal spray Place 2 sprays into both nostrils daily. (Patient taking differently: Place 2 sprays into both nostrils daily as needed for allergies. ) 16 g 1   fluticasone-salmeterol (ADVAIR HFA) 230-21 MCG/ACT inhaler Inhale 2 puffs into the lungs every morning. 48 g 1   folic acid (FOLVITE) 1 MG tablet Take 1 tablet (1 mg total) by mouth daily. 30 tablet 0   furosemide (LASIX) 40 MG tablet Take 1 tablet (40 mg total) by mouth daily. 30 tablet 0   gatifloxacin (ZYMAXID) 0.5 % SOLN Place 1 drop into the right eye daily.     hyoscyamine (LEVSIN SL) 0.125 MG SL tablet Place 1 tablet (0.125 mg total) under the tongue every 6 (six) hours as needed. (Patient taking differently: Place 0.125 mg under the tongue every 6 (six)  hours as needed for cramping. ) 90 tablet 3   ipratropium (ATROVENT HFA) 17 MCG/ACT inhaler Inhale 2 puffs into the lungs every 6 (six) hours as needed for wheezing. 1 Inhaler 12   macitentan (OPSUMIT) 10 MG tablet Take 1 tablet (10 mg total) by mouth daily. 30 tablet 11   pantoprazole (PROTONIX) 40 MG tablet Take 1 tablet (40 mg total) by mouth daily before breakfast. 30 tablet 0   potassium chloride (K-DUR) 10 MEQ tablet Take 2 tablets (20 mEq total) by mouth daily. 60 tablet 3   prednisoLONE acetate (PRED FORTE) 1 % ophthalmic suspension Place 1 drop into the right eye daily.     rosuvastatin (CRESTOR) 5 MG tablet Take 1 tablet (5 mg total) by mouth daily. 90 tablet 3   Selexipag (UPTRAVI) 1000 MCG TABS Take 1,000 mcg by mouth 2 (two) times daily. 60 tablet 11   spironolactone (ALDACTONE) 50 MG tablet TAKE 1 TABLET DAILY (Patient taking differently: Take 50 mg by mouth daily. ) 90 tablet 1   tadalafil, PAH, (ADCIRCA) 20 MG tablet TAKE 2 TABLETS (40 MG TOTAL) DAILY (Patient taking differently: Take 40 mg by mouth daily. ) 60 tablet 12   thiamine 100 MG tablet Take 1 tablet (100 mg total) by mouth daily. 30 tablet 0   tiZANidine (ZANAFLEX) 2 MG tablet Take 0.5-2 tablets (1-4 mg total) by mouth every 6 (six) hours as needed for muscle spasms. 30 tablet 0   nadolol (CORGARD) 20 MG tablet Take 1 tablet (20 mg total) by mouth daily. 30 tablet 0   No facility-administered medications prior to visit.     Allergies  Allergen Reactions   Aspirin Other (See Comments)    hypertension   Nitroglycerin     Headache    Review of Systems  Constitutional: Positive for malaise/fatigue. Negative for fever.  HENT: Negative for congestion.   Eyes: Negative for blurred vision.  Respiratory: Negative for shortness of breath.   Cardiovascular: Positive  for leg swelling. Negative for chest pain and palpitations.  Gastrointestinal: Negative for abdominal pain, blood in stool, melena and nausea.   Genitourinary: Negative for dysuria and frequency.  Musculoskeletal: Negative for falls.  Skin: Negative for rash.  Neurological: Positive for dizziness, weakness and headaches. Negative for loss of consciousness.  Endo/Heme/Allergies: Negative for environmental allergies.  Psychiatric/Behavioral: Negative for depression. The patient is not nervous/anxious.        Objective:    Physical Exam Constitutional:      Appearance: Normal appearance. He is not ill-appearing.  HENT:     Nose: Nose normal.  Pulmonary:     Effort: Pulmonary effort is normal.  Neurological:     Mental Status: He is alert and oriented to person, place, and time.  Psychiatric:        Mood and Affect: Mood normal.        Behavior: Behavior normal.     There were no vitals taken for this visit. Wt Readings from Last 3 Encounters:  02/14/19 196 lb (88.9 kg)  12/30/18 204 lb (92.5 kg)  11/09/18 191 lb 12.8 oz (87 kg)    Diabetic Foot Exam - Simple   No data filed     Lab Results  Component Value Date   WBC 2.2 (L) 02/14/2019   HGB 8.8 (L) 02/14/2019   HCT 27.4 (L) 02/14/2019   PLT 49 (L) 02/14/2019   GLUCOSE 102 (H) 02/14/2019   CHOL 109 08/09/2018   TRIG 114.0 08/09/2018   HDL 48.20 08/09/2018   LDLDIRECT 34.0 05/04/2018   LDLCALC 38 08/09/2018   ALT 23 02/13/2019   AST 53 (H) 02/13/2019   NA 139 02/14/2019   K 3.9 02/14/2019   CL 106 02/14/2019   CREATININE 2.07 (H) 02/14/2019   BUN 41 (H) 02/14/2019   CO2 23 02/14/2019   TSH 2.39 08/09/2018   PSA 0.49 11/01/2012   INR 1.4 (H) 02/12/2019   HGBA1C 4.8 08/09/2018    Lab Results  Component Value Date   TSH 2.39 08/09/2018   Lab Results  Component Value Date   WBC 2.2 (L) 02/14/2019   HGB 8.8 (L) 02/14/2019   HCT 27.4 (L) 02/14/2019   MCV 106.6 (H) 02/14/2019   PLT 49 (L) 02/14/2019   Lab Results  Component Value Date   NA 139 02/14/2019   K 3.9 02/14/2019   CO2 23 02/14/2019   GLUCOSE 102 (H) 02/14/2019   BUN 41 (H)  02/14/2019   CREATININE 2.07 (H) 02/14/2019   BILITOT 1.8 (H) 02/13/2019   ALKPHOS 226 (H) 02/13/2019   AST 53 (H) 02/13/2019   ALT 23 02/13/2019   PROT 6.2 (L) 02/13/2019   ALBUMIN 2.9 (L) 02/13/2019   CALCIUM 8.7 (L) 02/14/2019   ANIONGAP 10 02/14/2019   GFR 46.30 (L) 11/09/2018   Lab Results  Component Value Date   CHOL 109 08/09/2018   Lab Results  Component Value Date   HDL 48.20 08/09/2018   Lab Results  Component Value Date   LDLCALC 38 08/09/2018   Lab Results  Component Value Date   TRIG 114.0 08/09/2018   Lab Results  Component Value Date   CHOLHDL 2 08/09/2018   Lab Results  Component Value Date   HGBA1C 4.8 08/09/2018       Assessment & Plan:   Problem List Items Addressed This Visit    Hypertension (Chronic)    Well controlled, no changes to meds. Encouraged heart healthy diet such as the DASH diet  and exercise as tolerated.       Chronic renal insufficiency, stage III (moderate) (HCC) (Chronic)    Hydrate and monitor      Relevant Orders   Comprehensive metabolic panel   Cirrhosis with alcoholism ([place]) (Chronic)    Did not tolerate Nadolol for his varices made him weak and light headed. He feels better since stopping.       Alcohol dependence ([place])    Has been abstaining for more than a week and denies any DT symptoms encouraged to continue abstaining to decrease his risk of recurrent bleeding, ascites, liver damage and more      Rectal bleeding    No further episodes since patient was discharged home we will check CMP, CBC with diff, retic count, ferritin level. He had documented esophageal varices noted on EGD. He is advised that he has to stay away from alcohol to diminish his risk or recurrence. He did not tolerate Nadolol with light headedness but feels better since stopping. He has a follow up appointment with gastroenterology soon. He will seek care if it recurs.      Headache    Has struggled with headache, frontal daily since  being hospitalized. Encouraged increased hydration, 64 ounces of clear fluids daily. Minimize alcohol and caffeine. Eat small frequent meals with lean proteins and complex carbs. Avoid high and low blood sugars. Get adequate sleep, 7-8 hours a night. Needs exercise daily preferably in the morning. Is not a good candidate for NSAIDs, Tylenol or narcotics. Try ice and topical menthol and notify us if worsens.        Other Visit Diagnoses    Anemia, unspecified type    -  Primary   Relevant Orders   CBC w/Diff   Ferritin   Retic      I have discontinued [employer] "[employer] [place]"'s nadolol. I am also having him maintain his spironolactone, ipratropium, allopurinol, tiZANidine, cholestyramine, tadalafil (PAH), diphenoxylate-atropine, Albuterol Sulfate, fluticasone-salmeterol, hyoscyamine, Selexipag, rosuvastatin, potassium chloride, fluticasone, colchicine, azelastine, macitentan, gatifloxacin, prednisoLONE acetate, furosemide, folic acid, pantoprazole, and thiamine.  No orders of the defined types were placed in this encounter.   I discussed the assessment and treatment plan with the patient. The patient was provided an opportunity to ask questions and all were answered. The patient agreed with the plan and demonstrated an understanding of the instructions.   The patient was advised to call back or seek an in-person evaluation if the symptoms worsen or if the condition fails to improve as anticipated.  I provided 30 minutes of non-face-to-face time during this encounter.   Penni Homans, MD

## 2019-02-21 NOTE — Assessment & Plan Note (Signed)
Has been abstaining for more than a week and denies any DT symptoms encouraged to continue abstaining to decrease his risk of recurrent bleeding, ascites, liver damage and more

## 2019-02-21 NOTE — Assessment & Plan Note (Signed)
Did not tolerate Nadolol for his varices made him weak and light headed. He feels better since stopping.

## 2019-02-22 ENCOUNTER — Telehealth: Payer: Self-pay | Admitting: Emergency Medicine

## 2019-02-22 ENCOUNTER — Other Ambulatory Visit (INDEPENDENT_AMBULATORY_CARE_PROVIDER_SITE_OTHER): Payer: Medicare Other

## 2019-02-22 ENCOUNTER — Encounter (HOSPITAL_COMMUNITY): Payer: Self-pay | Admitting: *Deleted

## 2019-02-22 ENCOUNTER — Telehealth: Payer: Self-pay | Admitting: Family Medicine

## 2019-02-22 ENCOUNTER — Ambulatory Visit (HOSPITAL_COMMUNITY)
Admission: RE | Admit: 2019-02-22 | Discharge: 2019-02-22 | Disposition: A | Discharge status: Home / Self Care | Payer: Medicare Other | Source: Ambulatory Visit | Attending: Internal Medicine | Admitting: Internal Medicine

## 2019-02-22 ENCOUNTER — Other Ambulatory Visit: Payer: Self-pay

## 2019-02-22 DIAGNOSIS — D649 Anemia, unspecified: Secondary | ICD-10-CM

## 2019-02-22 DIAGNOSIS — I5032 Chronic diastolic (congestive) heart failure: Secondary | ICD-10-CM

## 2019-02-22 DIAGNOSIS — I2721 Secondary pulmonary arterial hypertension: Secondary | ICD-10-CM

## 2019-02-22 DIAGNOSIS — K7031 Alcoholic cirrhosis of liver with ascites: Secondary | ICD-10-CM

## 2019-02-22 DIAGNOSIS — N183 Chronic kidney disease, stage 3 unspecified: Secondary | ICD-10-CM

## 2019-02-22 DIAGNOSIS — K766 Portal hypertension: Secondary | ICD-10-CM | POA: Diagnosis not present

## 2019-02-22 LAB — CBC WITH DIFFERENTIAL/PLATELET
Basophils Absolute: 0 10*3/uL (ref 0.0–0.1)
Basophils Relative: 0.4 % (ref 0.0–3.0)
Eosinophils Absolute: 0.1 10*3/uL (ref 0.0–0.7)
Eosinophils Relative: 1.9 % (ref 0.0–5.0)
HCT: 28.5 % — ABNORMAL LOW (ref 39.0–52.0)
Hemoglobin: 9.4 g/dL — ABNORMAL LOW (ref 13.0–17.0)
Lymphocytes Relative: 26 % (ref 12.0–46.0)
Lymphs Abs: 0.7 10*3/uL (ref 0.7–4.0)
MCHC: 32.9 g/dL (ref 30.0–36.0)
MCV: 104.8 fl — ABNORMAL HIGH (ref 78.0–100.0)
Monocytes Absolute: 0.4 10*3/uL (ref 0.1–1.0)
Monocytes Relative: 15.5 % — ABNORMAL HIGH (ref 3.0–12.0)
Neutro Abs: 1.5 10*3/uL (ref 1.4–7.7)
Neutrophils Relative %: 56.2 % (ref 43.0–77.0)
Platelets: 45 10*3/uL — CL (ref 150.0–400.0)
RBC: 2.72 Mil/uL — ABNORMAL LOW (ref 4.22–5.81)
RDW: 15.9 % — ABNORMAL HIGH (ref 11.5–15.5)
WBC: 2.7 10*3/uL — ABNORMAL LOW (ref 4.0–10.5)

## 2019-02-22 LAB — COMPREHENSIVE METABOLIC PANEL
ALT: 29 U/L (ref 0–53)
AST: 93 U/L — ABNORMAL HIGH (ref 0–37)
Albumin: 3.5 g/dL (ref 3.5–5.2)
Alkaline Phosphatase: 287 U/L — ABNORMAL HIGH (ref 39–117)
BUN: 35 mg/dL — ABNORMAL HIGH (ref 6–23)
CO2: 23 mEq/L (ref 19–32)
Calcium: 8.5 mg/dL (ref 8.4–10.5)
Chloride: 107 mEq/L (ref 96–112)
Creatinine, Ser: 1.84 mg/dL — ABNORMAL HIGH (ref 0.40–1.50)
GFR: 44.35 mL/min — ABNORMAL LOW (ref >60.00)
Glucose, Bld: 92 mg/dL (ref 70–99)
Potassium: 4.2 mEq/L (ref 3.5–5.1)
Sodium: 139 mEq/L (ref 135–145)
Total Bilirubin: 1.2 mg/dL (ref 0.2–1.2)
Total Protein: 6.7 g/dL (ref 6.0–8.3)

## 2019-02-22 LAB — RETICULOCYTES
ABS Retic: 74200 cells/uL (ref 25000–9000)
Retic Ct Pct: 2.8 %

## 2019-02-22 LAB — FERRITIN: Ferritin: 25.1 ng/mL (ref 22.0–322.0)

## 2019-02-22 MED ORDER — BLOOD PRESSURE CUFF MISC: 0 refills | Status: AC

## 2019-02-22 NOTE — Addendum Note (Signed)
Encounter addended by: Scarlette Calico, RN on: 02/22/2019 5:04 PM  Actions taken: Clinical Note Signed

## 2019-02-22 NOTE — Telephone Encounter (Signed)
Mailed patient a copy of his RX for blood pressure meter so he can take to [place], we do not have [facility] information

## 2019-02-22 NOTE — Telephone Encounter (Signed)
Stable for him and just confirm no signs of bleeding.

## 2019-02-22 NOTE — Telephone Encounter (Signed)
Pt came in office for labs and stated is needing new BP cuff and BP thermometer rx to be sent to [facility]. Pt stated is needing a new one. Please advise pt when rx sent to [facility].

## 2019-02-22 NOTE — Progress Notes (Signed)
AVS sent via mychart, message sent to schedulers to sch appt in 2 weeks

## 2019-02-22 NOTE — Progress Notes (Signed)
Heart Failure TeleHealth Note  Due to national recommendations of social distancing due to [place] 19, Audio/video telehealth visit is felt to be most appropriate for this patient at this time.  See MyChart message from today for patient consent regarding telehealth for Darren Mueller.  Date:  02/22/2019   ID:  Darren Mueller, DOB 05-09-1951, MRN [id number]  Location: Home  Provider location: [facility] Type of Visit: Established patient  PCP:  Mosie Lukes, MD  Cardiologist:  No primary care provider on file. Primary HF: [place]  Chief Complaint: Heart Failure follow-up   History of Present Illness:  Darren Mueller ("Darren Mueller") is a 68 yo with history of COPD, portopulmonary hypertension with RV failure, and ETOH cirrhosis.  Admitted in 6/17 with CP and SOB. Was diuresed. Troponins normal. Underwent R/L cath. Minimal CAD with moderate PAH and preserved cardiac output.   Admitted 06/2016 and 07/2016 with syncope.  On September admission found to have elevated ETOH level as well as R 6th rib fracture.   Wore 30 day monitor up until 08/28/16. No AF noted. No events.   Admitted February 2018 after syncopal event. LINQ placed.   Admitted 7/4 through 05/08/17 with volume depletion. Diuretics held and changed to as needed for weight 200 pounds or greater. Discharge weight was 194 pounds.   Was admitted last week with hematochezia and melena. His hemoglobin remained stable.  No episodes of GI bleed after hospitalization.  Underwent EGD by GI with finding of Grade 1 and a small esophageal varices, no evidence of recent bleeding, portal hypertensive gastropathy. Started on nadolol which made him very dizzy. He stopped this and feels a lot    PAH meds 1) Macitnentan 10 mg daily 2) Adcirca 40 mg daily  3) Selexipeg  1000/1200 daily  Presents today for telehealth visit in setting of [place] pandemic. Says he is doing pretty well after stopping nadolol..  Now taking lasix every day but still swelling. Weight at 210 (was 190s in our office previously). + LE edema. Denies SOB. Able to do all ADLs without problem. Went to Dr. Frederik Pear office to get blood drawn today. Hasn't drank any ETOH in 5 days.   Darren Mueller denies symptoms worrisome for COVID 19.    Studies: ECHO 12/14 EF 55-60% Peak PA pressure 35. Severe RV dysfunction  ECHO 7/15 EF 60% RV moderately to severely dilated. Moderate HK RVSP 38m HG  ECHO 6/16 EF 60% RV moderately to severely dilated. Severe HK RVSP ~65 mm HG. D-shaped septum Echo 2/17 LVEF 60-65% RV massively dilated. Flat septum. Severe HK. Moderate TR RVSP ~65. IVC small. No effusion  ECHO 01/01/2017 EF 50-55% Grade I DD. RV severely dilated. Peak PA pressure 62 mm hg Echo 9/19 with EF 60-65% RV markedly dilated and hypokinetic with D-shaped septum. RVSP 660mG  Cath 6/17 Mid RCA lesion, 20% stenosed. Dist LAD lesion, 20% stenosed. Ao = 107/70 (87) LV = 106/3/11 RA = 4 RV = 74/11 PA = 74/26 (45) PCW = 6 Fick cardiac output/index = 4.6.2.2 PVR = 8.5 Ao sat = 95% PA sat = 61%, 58%  RHC 2/17 RA = 11 RV = 80/16/19 PA = 83/61 (71) PCW = 9 Fick cardiac output/index = 5.6/2.6 PVR = 10.3 WU Ao sat = 94% PA sat = 65%, 68% High SVC sat = 65%   PFTs 3/14 FEV1 2.02 L (58%) FVC 2.7L (54%) FEV1/FVC (89%) DLCO 53%  PFTs 1/17 FEV1 2.05 L (61%) FVC  2.48L (56%) DLCO 44%   Ab u/s 8/16: + cirrhosis/mild to moderate splenomegaly  6 min walk 01/10/13, 1290 feet  6MW (01/10/14) = 1280 feet (380 m) 6MW (9/15) = 1350 feet (411 m) O2 sats ranged from 89-95% on room air, HR ranged from 100-129. 6MW (6/16) = 384 meters  6MW (2/17) = 1250 feet (355m 6MW (8/17) = 1320 feet       Past Medical History:  Diagnosis Date  . Alcohol dependence ([facility] [place] 03/22/2011   In remission   . Alcohol dependence in remission ([facility] 03/22/2011   In remission   . Alcoholic cirrhosis of liver with ascites ([facility] 2014  .  Anginal pain ([facility]   . CHF (congestive heart failure) ([facility]   . COPD (chronic obstructive pulmonary disease) ([facility]   . Coronary artery disease   . Dermatitis 06/10/2015  . Diarrhea 09/04/2013  . Dyspnea   . Elbow pain, left 03/17/2017  . GERD (gastroesophageal reflux disease)   . Gout   . Headache   . Heart murmur   . Hx of colonic polyps   . Hypertension   . Hypertriglyceridemia 03/17/2017  . Hypopotassemia   . Otalgia 11/28/2013  . Other chronic pulmonary heart diseases   . Preventative health care 06/29/2016  . Red eye 03/03/2016   right  . Renal insufficiency 07/18/2013  . Seizures ([facility]   . Sleep apnea    does not wear CPAP  . Thrombocytopenia ([facility] 06/29/2016  . Unspecified hypothyroidism 01/25/2013  . Unspecified pleural effusion    Past Surgical History:  Procedure Laterality Date  . CARDIAC CATHETERIZATION N/A 12/21/2015   Procedure: Right Heart Cath;  Surgeon: DJolaine Artist MD;  [facility];  Service: Cardiovascular;  Laterality: N/A;  . CARDIAC CATHETERIZATION N/A 04/15/2016   Procedure: Right/Left Heart Cath and Coronary Angiography;  Surgeon: DJolaine Artist MD;  [facility];  Service: Cardiovascular;  Laterality: N/A;  . COLONOSCOPY N/A 12/08/2013   Procedure: COLONOSCOPY;  Surgeon: DMilus Banister MD;  [facility];  Service: Endoscopy;  Laterality: N/A;  . ESOPHAGOGASTRODUODENOSCOPY (EGD) WITH PROPOFOL N/A 03/27/2016   Procedure: ESOPHAGOGASTRODUODENOSCOPY (EGD) WITH PROPOFOL;  Surgeon: DMilus Banister MD;  [place]  Service: Endoscopy;  Laterality: N/A;  . ESOPHAGOGASTRODUODENOSCOPY (EGD) WITH PROPOFOL N/A 02/14/2019   Procedure: ESOPHAGOGASTRODUODENOSCOPY (EGD) WITH PROPOFOL;  Surgeon: PJerene Bears MD;  [facility];  Service: Gastroenterology;  Laterality: N/A;  . LOOP RECORDER INSERTION N/A 01/01/2017   Procedure: Loop Recorder Insertion;  Surgeon: JThompson Grayer MD;  [facility];  Service: Cardiovascular;  Laterality: N/A;  . RIGHT HEART CATHETERIZATION Right 06/06/2014   Procedure: RIGHT HEART CATH;  Surgeon: DJolaine Artist MD;  [facility];  Service: Cardiovascular;  Laterality: Right;  . UVULOPALATOPHARYNGOPLASTY  1999     Current Outpatient Medications  Medication Sig Dispense Refill  . Albuterol Sulfate (PROAIR RESPICLICK) 1956(90 Base) MCG/ACT AEPB Inhale 2 puffs into Darren lungs every 6 (six) hours as needed (SOB or wheezing). 3 each 1  . allopurinol (ZYLOPRIM) 100 MG tablet Take 2 tablets (200 mg total) by mouth daily. (Patient not taking: Reported on 02/13/2019) 180 tablet 1  . azelastine (ASTELIN) 0.1 % nasal spray Place 2 sprays into both nostrils 2 (two) times daily. Use in each nostril as directed (Patient taking differently: Place 2 sprays into both nostrils daily as needed for rhinitis or allergies. ) 30 mL 1  . cholestyramine (QUESTRAN)  4 g packet Take 1 packet (4 g total) by mouth 2 (two) times daily. (Patient not taking: Reported on 02/13/2019) 60 each 12  . colchicine 0.6 MG tablet Take 1 tablet (0.6 mg total) by mouth 2 (two) times daily as needed. (Patient taking differently: Take 0.6 mg by mouth daily. ) 90 tablet 1  . diphenoxylate-atropine (LOMOTIL) 2.5-0.025 MG tablet Take 1 tablet by mouth 3 (three) times daily. (Patient taking differently: Take 1 tablet by mouth 4 (four) times daily as needed for diarrhea or loose stools. ) 60 tablet 0  . fluticasone (FLONASE) 50 MCG/ACT nasal spray Place 2 sprays into both nostrils daily. (Patient taking differently: Place 2 sprays into both nostrils daily as needed for allergies. ) 16 g 1  . fluticasone-salmeterol (ADVAIR HFA) 230-21 MCG/ACT inhaler Inhale 2 puffs into Darren lungs every morning. 48 g 1  . folic acid (FOLVITE) 1 MG tablet Take 1 tablet (1 mg total) by mouth daily. 30 tablet 0  . furosemide (LASIX) 40 MG tablet Take 1 tablet (40 mg total) by mouth daily. 30 tablet 0  . gatifloxacin  (ZYMAXID) 0.5 % SOLN Place 1 drop into Darren right eye daily.    . hyoscyamine (LEVSIN SL) 0.125 MG SL tablet Place 1 tablet (0.125 mg total) under Darren tongue every 6 (six) hours as needed. (Patient taking differently: Place 0.125 mg under Darren tongue every 6 (six) hours as needed for cramping. ) 90 tablet 3  . ipratropium (ATROVENT HFA) 17 MCG/ACT inhaler Inhale 2 puffs into Darren lungs every 6 (six) hours as needed for wheezing. 1 Inhaler 12  . macitentan (OPSUMIT) 10 MG tablet Take 1 tablet (10 mg total) by mouth daily. 30 tablet 11  . pantoprazole (PROTONIX) 40 MG tablet Take 1 tablet (40 mg total) by mouth daily before breakfast. 30 tablet 0  . potassium chloride (K-DUR) 10 MEQ tablet Take 2 tablets (20 mEq total) by mouth daily. 60 tablet 3  . prednisoLONE acetate (PRED FORTE) 1 % ophthalmic suspension Place 1 drop into Darren right eye daily.    . rosuvastatin (CRESTOR) 5 MG tablet Take 1 tablet (5 mg total) by mouth daily. 90 tablet 3  . Selexipag (UPTRAVI) 1000 MCG TABS Take 1,000 mcg by mouth 2 (two) times daily. 60 tablet 11  . spironolactone (ALDACTONE) 50 MG tablet TAKE 1 TABLET DAILY (Patient taking differently: Take 50 mg by mouth daily. ) 90 tablet 1  . tadalafil, PAH, (ADCIRCA) 20 MG tablet TAKE 2 TABLETS (40 MG TOTAL) DAILY (Patient taking differently: Take 40 mg by mouth daily. ) 60 tablet 12  . thiamine 100 MG tablet Take 1 tablet (100 mg total) by mouth daily. 30 tablet 0  . tiZANidine (ZANAFLEX) 2 MG tablet Take 0.5-2 tablets (1-4 mg total) by mouth every 6 (six) hours as needed for muscle spasms. 30 tablet 0   No current facility-administered medications for this encounter.     Allergies:   Aspirin and Nitroglycerin   Social History:  Darren patient  reports that he quit smoking about 39 years ago. His smoking use included cigarettes. He has a 10.00 pack-year smoking history. He has never used smokeless tobacco. He reports current alcohol use of about 6.0 standard drinks of alcohol per  week. He reports that he does not use drugs.   Family History:  Darren patient's family history includes Alcoholism in his maternal grandfather; Cancer in his father; Gout in his brother; Heart attack in his mother; Heart disease in his mother;  Hypertension in his brother, brother, and mother; Kidney failure in his mother; Liver disease in his maternal grandfather and mother; Migraines in his sister; Prostate cancer in his father.   ROS:  Please see Darren history of present illness.   All other systems are personally reviewed and negative.   Exam:  (Video/Tele Health Call; Exam is subjective and or/visual.) General:  Speaks in full sentences. No resp difficulty. Lungs: Normal respiratory effort with conversation.  Abdomen: Non-distended per patient report Extremities: Pt denies edema. Neuro: Alert & oriented x 3.   Recent Labs: 08/09/2018: TSH 2.39 09/27/2018: Pro B Natriuretic peptide (BNP) 415.0 02/12/2019: B Natriuretic Peptide 158.1 02/13/2019: ALT 23; Magnesium 1.6 02/14/2019: BUN 41; Creatinine, Ser 2.07; Hemoglobin 8.8; Platelets 49; Potassium 3.9; Sodium 139  Personally reviewed   Wt Readings from Last 3 Encounters:  02/14/19 88.9 kg (196 lb)  12/30/18 92.5 kg (204 lb)  11/09/18 87 kg (191 lb 12.8 oz)      ASSESSMENT AND PLAN:  1. PAH: Suspected portopulmonary HTN in setting of ETOH cirrhosis. - Stable NYHA III. 6MW 9/19 1280 feet (390 meters) - Echo 9/19 with EF 60-65% RV markedly dilated and hypokinetic with D-shaped septum  - Continue selexipag 1000 mcg twice a day. Intolerant higher dose due to diarrhea and headaches .  - Continue macitentan 10 daily and adcirca 40 mg daily. - With RV strain on echo may need to consider IV therapies in Darren near future.  - Discussed need for complete ETOH cessation.  - Volume status appears elevated by report. Double lasix to 40 bid fo 2-3 days - F/u televist 2 weeks  2. CKD III:  - Baseline creatinine 1.5-2.0 - Followed by Dr. Marval Regal   - Had labs today with Dr. Charlett Blake  3. Cirrhosis:  - Likely combination of RV failure and ETOH - Follows with Dr. Ardis Hughs. Has appt in 2 weeks - Did not tolerate nadolol - Recent EGC 4/20 stable - Stressed need for ETOH cessation  4. Recurrent syncope/seizure - by history event sounds like a seizure but need to make sure not cardiac source. Seen in ED 1/15. ETOH level high,. CT brain ok.  - LINQ interrogated personally and no arrhythmias and thus most likely seizure - Continue Keppra. Follows with Dr. Delice Lesch  5. HTN:  - Blood pressure well controlled. Continue current regimen.   6. OSA:  - Has mild OSA with AHI 12 and desats down to 82%.  - Intolerant CPAP.   7. Pancytopenia:  - Has seen Dr. Alen Blew in Hematology - felt like it is due to splenic sequestration.    COVID screen Darren patient does not have any symptoms that suggest any further testing/ screening at this time.  Social distancing reinforced today.  Recommended follow-up:  As above  Relevant cardiac medications were reviewed at length with Darren patient today.   Darren patient does not have concerns regarding their medications at this time.   Darren following changes were made today:  As above  Today, I have spent 16 minutes with Darren patient with telehealth technology discussing Darren above issues .    Signed, Glori Bickers, MD  02/22/2019 1:52 PM  [facility] [place] [address] [facility] [place] [address] [phone] (office) [phone] (fax)

## 2019-02-22 NOTE — Telephone Encounter (Signed)
"  CRITICAL VALUE STICKER  CRITICAL VALUE [address]  RECEIVER (on-site recipient of call):Abdulahad Mederos  DATE & TIME NOTIFIED: 02/22/19 1600  MESSENGER (representative from lab):Karen  MD NOTIFIED: Charlett Blake  TIME OF NOTIFICATION:1606  RESPONSE:

## 2019-02-22 NOTE — Patient Instructions (Addendum)
Increase Furosemide to 40 mg Twice daily for 2-3 days, let us know if this does not help  Our office will call you to schedule a telephone visit in 2 weeks.

## 2019-02-23 ENCOUNTER — Encounter: Payer: Self-pay | Admitting: Family Medicine

## 2019-02-23 NOTE — Telephone Encounter (Signed)
Intolerant  Nadolol due to dizziness.   Marland Kitchen He stopped on his own and feels better.   No change.

## 2019-02-25 ENCOUNTER — Telehealth: Payer: Self-pay

## 2019-02-25 NOTE — Telephone Encounter (Signed)
Nurse triage may handle

## 2019-02-25 NOTE — Telephone Encounter (Signed)
Copied from [place] [phone]. Topic: General - Other >> Feb 25, 2019  2:28 PM Rayann Heman wrote: Reason for CRM: pt called and stated that he would like a call back about recent lab results. Please advise

## 2019-02-25 NOTE — Telephone Encounter (Signed)
Patient called, left VM to return call to the office for a message from Dr. Charlett Blake.

## 2019-02-25 NOTE — Telephone Encounter (Signed)
Called patient left message for patient to call the office back

## 2019-02-28 ENCOUNTER — Encounter: Payer: Self-pay | Admitting: Family Medicine

## 2019-03-01 ENCOUNTER — Encounter (HOSPITAL_COMMUNITY): Payer: Self-pay

## 2019-03-01 NOTE — Telephone Encounter (Signed)
Please advise

## 2019-03-08 ENCOUNTER — Other Ambulatory Visit: Payer: Self-pay

## 2019-03-08 ENCOUNTER — Encounter: Payer: Self-pay | Admitting: Gastroenterology

## 2019-03-08 ENCOUNTER — Ambulatory Visit (INDEPENDENT_AMBULATORY_CARE_PROVIDER_SITE_OTHER): Payer: Medicare Other | Admitting: Gastroenterology

## 2019-03-08 VITALS — Ht 72.0 in | Wt 204.0 lb

## 2019-03-08 DIAGNOSIS — J90 Pleural effusion, not elsewhere classified: Secondary | ICD-10-CM

## 2019-03-08 DIAGNOSIS — K7460 Unspecified cirrhosis of liver: Secondary | ICD-10-CM | POA: Diagnosis not present

## 2019-03-08 DIAGNOSIS — K7030 Alcoholic cirrhosis of liver without ascites: Secondary | ICD-10-CM | POA: Diagnosis not present

## 2019-03-08 DIAGNOSIS — K635 Polyp of colon: Secondary | ICD-10-CM

## 2019-03-08 MED ORDER — LUSUTROMBOPAG 3 MG PO TABS: 3.0000 mg | ORAL_TABLET | Freq: Every day | ORAL | 0 refills | Status: DC

## 2019-03-08 MED ORDER — PEG 3350-KCL-NA BICARB-NACL 420 G PO SOLR: 4000.0000 mL | ORAL | 0 refills | Status: DC

## 2019-03-08 MED FILL — GAVILYTE-G SOLUTION: 236 | 1 days supply | Qty: 4000 | Fill #0

## 2019-03-08 NOTE — Progress Notes (Signed)
Review of pertinent gastrointestinal problems:  1. history of adenomatous colon polyps : Colonoscopy 12/10: "Two sessile polyps were found in hepatic flexure (8-30m), piecemeal resected and then tatoo'd with INigerInk" path showed "moderate to severe dysplasia" and he was recommended to have repeat examination at 3 month interval. Colonoscopy December 2012 found 4 polyps, one was piecemeal removed, adenoma. He was recommended to have repeat at 6 month recall. With pulm HTN, active, dyspnea this was changed to 04/2014. Colonsocopy 12/2013 " one small polyp, was not adenomatous. Recall colonoscopy 12/2018 2. incidentally noted esophageal varices on 2012 EGDdone for dysphasia. 2012 Abdominal ultrasound showed a normal liver without clear sign of cirrhosis. Was told to cut back on drinking.  3. Cirrhosis:nodular liver noted on CT scan of chest done 2014 for cough, found to have also R pleural effusion felt to be hepatic hydrothorax.From chronic etoh over use. Labs 12/12 Hep A Ab +, Hep B S ag and Ab negative, Hep C Ab neg; ANA, AIA, AMA, ceruloplasm, iron testing all neg;   Last liver imaging: 02/2019 cirrhosis without liver masses.  Chronic thrombocytopenia, platelets 30-40,000.  Continues to drink alcohol as of 02/2019  Last AFP: 02/2019 1.8  Last EGD 02/2019 while inpatient, small esophageal varices without stigmata of recent bleeding, mild portal gastropathy.  He was intolerant of even low-dose nadolol, significant dizziness 02/2019.  Hep B immunization series started 11/2012   MELD 17 (02/2019 labs) 4. 2014 right heart angiogram suggested severe right heart failure, pulmonary hypertensionstarted on macitentan 01/18/2013.Followed closely by CHF clinic who has assumed lead role in managing diuretics. 5. Diarrhea: Colonoscopy 12/2013; normal TI, no macroscopic colitis, no microscopic colitis on biopsies. Possibly med related, pulmonary hypertension medicine.   This service was provided via  virtual visit.Only audio was used.  The patient was located at home.  I was located in my office.  The patient did consent to this virtual visit and is aware of possible charges through their insurance for this visit.  The patient is an established patient.  My certified medical assistant, KGrace Bushy contributed to this visit by contacting the patient by phone 1 or 2 business days prior to the appointment and also followed up on the recommendations I made after the visit.  Time spent on virtual visit: 24 min   HPI: This is a very pleasant 68year old man whom I last saw about 3 years ago.  I last saw him in the office almost 3 years ago for cirrhosis follow-up and chronic diarrhea.  He was due for hepatoma screening and so I arrange that.  Restage his liver disease with basic labs.  I felt his chronic diarrhea was from his pulmonary hypertension medicines, Imodium seemed to help and so I advised continuing it.  I recommended he return to see me in 5 months and sooner if needed.  I have not heard from him since.  He was admitted last month with an isolated episode of painless rectal bleeding and acute on chronic macrocytic anemia.  The bleeding was bright red blood but he also had seen some dark stools a week prior.  He received 2 units of bloods.  He underwent an upper endoscopy which showed some relatively small esophageal varices and mild portal gastropathy.  Not clear that these were causing his acute on chronic anemia.  He was recommended to start nadolol which he did but he noticed significant dizziness very quickly after starting and so he stopped the medicine.  His dizziness improved after he  stopped the low-dose nadolol.  Since discharge he has seen no more overt bleeding, no black stools.  Cardiology continues to manage his diuretics.  He continues to drink alcohol however he does not admit to drinking very much of it.  His last drink at all was a week ago.  He has cramping in his lower  abdomen.  This occurs intermittently.   Chief complaint is cirrhosis  ROS: complete GI ROS as described in HPI, all other review negative.  Constitutional:  No unintentional weight loss   Past Medical History:  Diagnosis Date  . Alcohol dependence ([place]) 03/22/2011   In remission   . Alcohol dependence in remission ([place]) 03/22/2011   In remission   . Alcoholic cirrhosis of liver with ascites ([place]) 2014  . Anginal pain ([place])   . CHF (congestive heart failure) ([place])   . COPD (chronic obstructive pulmonary disease) ([place])   . Coronary artery disease   . Dermatitis 06/10/2015  . Diarrhea 09/04/2013  . Dyspnea   . Elbow pain, left 03/17/2017  . GERD (gastroesophageal reflux disease)   . Gout   . Headache   . Heart murmur   . Hx of colonic polyps   . Hypertension   . Hypertriglyceridemia 03/17/2017  . Hypopotassemia   . Otalgia 11/28/2013  . Other chronic pulmonary heart diseases   . Preventative health care 06/29/2016  . Red eye 03/03/2016   right  . Renal insufficiency 07/18/2013  . Seizures ([place])   . Sleep apnea    does not wear CPAP  . Thrombocytopenia ([place]) 06/29/2016  . Unspecified hypothyroidism 01/25/2013  . Unspecified pleural effusion     Past Surgical History:  Procedure Laterality Date  . CARDIAC CATHETERIZATION N/A 12/21/2015   Procedure: Right Heart Cath;  Surgeon: Jolaine Artist, MD;  Location: [facility];  Service: Cardiovascular;  Laterality: N/A;  . CARDIAC CATHETERIZATION N/A 04/15/2016   Procedure: Right/Left Heart Cath and Coronary Angiography;  Surgeon: Jolaine Artist, MD;  Location: [facility];  Service: Cardiovascular;  Laterality: N/A;  . COLONOSCOPY N/A 12/08/2013   Procedure: COLONOSCOPY;  Surgeon: Milus Banister, MD;  Location: [facility];  Service: Endoscopy;  Laterality: N/A;  . ESOPHAGOGASTRODUODENOSCOPY (EGD) WITH PROPOFOL N/A 03/27/2016   Procedure: ESOPHAGOGASTRODUODENOSCOPY (EGD) WITH PROPOFOL;  Surgeon: Milus Banister, MD;   Location: [facility];  Service: Endoscopy;  Laterality: N/A;  . ESOPHAGOGASTRODUODENOSCOPY (EGD) WITH PROPOFOL N/A 02/14/2019   Procedure: ESOPHAGOGASTRODUODENOSCOPY (EGD) WITH PROPOFOL;  Surgeon: Jerene Bears, MD;  Location: [facility] ENDOSCOPY;  Service: Gastroenterology;  Laterality: N/A;  . LOOP RECORDER INSERTION N/A 01/01/2017   Procedure: Loop Recorder Insertion;  Surgeon: Thompson Grayer, MD;  Location: [facility];  Service: Cardiovascular;  Laterality: N/A;  . RIGHT HEART CATHETERIZATION Right 06/06/2014   Procedure: RIGHT HEART CATH;  Surgeon: Jolaine Artist, MD;  Location: [facility] C[facility] LAB;  Service: Cardiovascular;  Laterality: Right;  . UVULOPALATOPHARYNGOPLASTY  1999    Current Outpatient Medications  Medication Sig Dispense Refill  . Albuterol Sulfate (PROAIR RESPICLICK) 161 (90 Base) MCG/ACT AEPB Inhale 2 puffs into the lungs every 6 (six) hours as needed (SOB or wheezing). 3 each 1  . allopurinol (ZYLOPRIM) 100 MG tablet Take 2 tablets (200 mg total) by mouth daily. 180 tablet 1  . azelastine (ASTELIN) 0.1 % nasal spray Place 2 sprays into both nostrils 2 (two) times daily. Use in each nostril as directed (Patient taking differently: Place 2 sprays into both nostrils daily  as needed for rhinitis or allergies. ) 30 mL 1  . Blood Pressure Monitoring (BLOOD PRESSURE CUFF) MISC Use as directed. Check blood pressure bid prn Dx:I10 1 each 0  . diphenoxylate-atropine (LOMOTIL) 2.5-0.025 MG tablet Take 1 tablet by mouth 3 (three) times daily. (Patient taking differently: Take 1 tablet by mouth 4 (four) times daily as needed for diarrhea or loose stools. ) 60 tablet 0  . fluticasone-salmeterol (ADVAIR HFA) 230-21 MCG/ACT inhaler Inhale 2 puffs into the lungs every morning. 48 g 1  . folic acid (FOLVITE) 1 MG tablet Take 1 tablet (1 mg total) by mouth daily. 30 tablet 0  . furosemide (LASIX) 40 MG tablet Take 1 tablet (40 mg total) by mouth daily. 30 tablet 0  . gatifloxacin (ZYMAXID) 0.5 %  SOLN Place 1 drop into the right eye daily.    . hyoscyamine (LEVSIN SL) 0.125 MG SL tablet Place 1 tablet (0.125 mg total) under the tongue every 6 (six) hours as needed. (Patient taking differently: Place 0.125 mg under the tongue every 6 (six) hours as needed for cramping. ) 90 tablet 3  . macitentan (OPSUMIT) 10 MG tablet Take 1 tablet (10 mg total) by mouth daily. 30 tablet 11  . pantoprazole (PROTONIX) 40 MG tablet Take 1 tablet (40 mg total) by mouth daily before breakfast. 30 tablet 0  . potassium chloride (K-DUR) 10 MEQ tablet Take 2 tablets (20 mEq total) by mouth daily. 60 tablet 3  . rosuvastatin (CRESTOR) 5 MG tablet Take 1 tablet (5 mg total) by mouth daily. 90 tablet 3  . Selexipag (UPTRAVI) 1000 MCG TABS Take 1,000 mcg by mouth 2 (two) times daily. 60 tablet 11  . spironolactone (ALDACTONE) 50 MG tablet TAKE 1 TABLET DAILY (Patient taking differently: Take 50 mg by mouth daily. ) 90 tablet 1  . tadalafil, PAH, (ADCIRCA) 20 MG tablet TAKE 2 TABLETS (40 MG TOTAL) DAILY (Patient taking differently: Take 40 mg by mouth daily. ) 60 tablet 12  . thiamine 100 MG tablet Take 1 tablet (100 mg total) by mouth daily. 30 tablet 0  . tiZANidine (ZANAFLEX) 2 MG tablet Take 0.5-2 tablets (1-4 mg total) by mouth every 6 (six) hours as needed for muscle spasms. 30 tablet 0   No current facility-administered medications for this visit.     Allergies as of 03/08/2019 - Review Complete 03/08/2019  Allergen Reaction Noted  . Aspirin Other (See Comments) 03/21/2011  . Nitroglycerin  05/07/2017    Family History  Problem Relation Age of Onset  . Heart disease Mother   . Heart attack Mother   . Hypertension Mother   . Kidney failure Mother   . Liver disease Mother        stage 4 liver disease  . Prostate cancer Father   . Cancer Father        lung  . Hypertension Brother   . Gout Brother   . Alcoholism Maternal Grandfather   . Liver disease Maternal Grandfather   . Migraines Sister   .  Hypertension Brother   . Colon cancer Neg Hx     Social History   Socioeconomic History  . Marital status: Divorced    Spouse name: Not on file  . Number of children: 3  . Years of education: Not on file  . Highest education level: Not on file  Occupational History  . Occupation: Retired    Comment: Microbiologist  . Financial resource strain: Not hard at  all  . Food insecurity:    Worry: Never true    Inability: Never true  . Transportation needs:    Medical: No    Non-medical: No  Tobacco Use  . Smoking status: Former Smoker    Packs/day: 2.00    Years: 5.00    Pack years: 10.00    Types: Cigarettes    Last attempt to quit: 09/22/1979    Years since quitting: 39.4  . Smokeless tobacco: Never Used  Substance and Sexual Activity  . Alcohol use: Yes    Alcohol/week: 6.0 standard drinks    Types: 6 Shots of liquor per week    Comment: social drinker  . Drug use: No  . Sexual activity: Yes    Birth control/protection: Spermicide  Lifestyle  . Physical activity:    Days per week: 0 days    Minutes per session: 0 min  . Stress: Rather much  Relationships  . Social connections:    Talks on phone: More than three times a week    Gets together: Three times a week    Attends religious service: More than 4 times per year    Active member of club or organization: Yes    Attends meetings of clubs or organizations: More than 4 times per year    Relationship status: Divorced  . Intimate partner violence:    Fear of current or ex partner: Not on file    Emotionally abused: Not on file    Physically abused: Not on file    Forced sexual activity: Not on file  Other Topics Concern  . Not on file  Social History Narrative   0 caffeine drinks daily      Physical Exam: Unable to perform because this was a "telemed visit" due to current Covid-19 pandemic  Assessment and plan: 68 y.o. male with decompensated cirrhosis  Etiology of his cirrhosis is likely  alcohol and possibly cardiac related given his severe pulmonary artery hypertension.  He continues to intermittently drink alcohol but does not admit to drinking very much.  Hard to know what to believe.  He has not had any more bleeding since discharge from the hospital about a month ago.  He is clearly intolerant of nonselective beta-blockers, had significant dizziness even with very low-dose nadolol.  Current meld score is 17.  Cardiology has been managing his diuretics given his severe pulmonary artery hypertension.  He is due for high risk polyp surveillance, he has had fairly good sized precancerous polyps in the past and was due for recall several months ago.  He has chronically low platelets and prior to the colonoscopy I will have him start a 7-day course of Mulpleta and will check his platelets the day of the colonoscopy which I think is safest to be done at the hospital given his severe cardiac issues.  Please see the "Patient Instructions" section for addition details about the plan.  Owens Loffler, MD [facility] 03/08/2019, 3:46 PM

## 2019-03-08 NOTE — Patient Instructions (Addendum)
We will arrange colonoscopy at [facility] in the next few weeks for recent rectal bleeding and history of significant polyps.  After picking the date of the procedure he will start the medicine MULPLETA 3 mg pills 1 pill once a day for 7 days (to start this medicine exactly 12 days prior to the colonoscopy so that he has completed it 5 days before the colonoscopy).  He will need a CBC the morning of his colonoscopy during check-in.  Start Mulpleta on 03/12/19 one pill daily for 7 days

## 2019-03-08 NOTE — H&P (View-Only) (Signed)
Review of pertinent gastrointestinal problems:  1. history of adenomatous colon polyps : Colonoscopy 12/10: "Two sessile polyps were found in hepatic flexure (8-30m), piecemeal resected and then tatoo'd with INigerInk" path showed "moderate to severe dysplasia" and he was recommended to have repeat examination at 3 month interval. Colonoscopy December 2012 found 4 polyps, one was piecemeal removed, adenoma. He was recommended to have repeat at 6 month recall. With pulm HTN, active, dyspnea this was changed to 04/2014. Colonsocopy 12/2013 E[facility] Doubleday" one small polyp, was not adenomatous. Recall colonoscopy 12/2018 2. incidentally noted esophageal varices on 2012 EGDdone for dysphasia. 2012 Abdominal ultrasound showed a normal liver without clear sign of cirrhosis. Was told to cut back on drinking.  3. Cirrhosis:nodular liver noted on CT scan of chest done 2014 for cough, found to have also R pleural effusion felt to be hepatic hydrothorax.From chronic etoh over use. Labs 12/12 Hep A Ab +, Hep B S ag and Ab negative, Hep C Ab neg; ANA, AIA, AMA, ceruloplasm, iron testing all neg;   Last liver imaging: 02/2019 cirrhosis without liver masses.  Chronic thrombocytopenia, platelets 30-40,000.  Continues to drink alcohol as of 02/2019  Last AFP: 02/2019 1.8  Last EGD 02/2019 while inpatient, small esophageal varices without stigmata of recent bleeding, mild portal gastropathy.  He was intolerant of even low-dose nadolol, significant dizziness 02/2019.  Hep B immunization series started 11/2012   MELD 17 (02/2019 labs) 4. 2014 right heart angiogram suggested severe right heart failure, pulmonary hypertensionstarted on macitentan 01/18/2013.Followed closely by CHF clinic who has assumed lead role in managing diuretics. 5. Diarrhea: Colonoscopy 12/2013; normal TI, no macroscopic colitis, no microscopic colitis on biopsies. Possibly med related, pulmonary hypertension medicine.   This service was provided via  virtual visit.Only audio was used.  The patient was located at home.  I was located in my office.  The patient did consent to this virtual visit and is aware of possible charges through their insurance for this visit.  The patient is an established patient.  My certified medical assistant, KGrace Bushy contributed to this visit by contacting the patient by phone 1 or 2 business days prior to the appointment and also followed up on the recommendations I made after the visit.  Time spent on virtual visit: 24 min   HPI: This is a very pleasant 68year old man whom I last saw about 3 years ago.  I last saw him in the office almost 3 years ago for cirrhosis follow-up and chronic diarrhea.  He was due for hepatoma screening and so I arrange that.  Restage his liver disease with basic labs.  I felt his chronic diarrhea was from his pulmonary hypertension medicines, Imodium seemed to help and so I advised continuing it.  I recommended he return to see me in 5 months and sooner if needed.  I have not heard from him since.  He was admitted last month with an isolated episode of painless rectal bleeding and acute on chronic macrocytic anemia.  The bleeding was bright red blood but he also had seen some dark stools a week prior.  He received 2 units of bloods.  He underwent an upper endoscopy which showed some relatively small esophageal varices and mild portal gastropathy.  Not clear that these were causing his acute on chronic anemia.  He was recommended to start nadolol which he did but he noticed significant dizziness very quickly after starting and so he stopped the medicine.  His dizziness improved after he  stopped the low-dose nadolol.  Since discharge he has seen no more overt bleeding, no black stools.  Cardiology continues to manage his diuretics.  He continues to drink alcohol however he does not admit to drinking very much of it.  His last drink at all was a week ago.  He has cramping in his lower  abdomen.  This occurs intermittently.   Chief complaint is cirrhosis  ROS: complete GI ROS as described in HPI, all other review negative.  Constitutional:  No unintentional weight loss   Past Medical History:  Diagnosis Date  . Alcohol dependence ([place]) 03/22/2011   In remission   . Alcohol dependence in remission ([place]) 03/22/2011   In remission   . Alcoholic cirrhosis of liver with ascites ([place]) 2014  . Anginal pain ([place])   . CHF (congestive heart failure) ([place])   . COPD (chronic obstructive pulmonary disease) ([place])   . Coronary artery disease   . Dermatitis 06/10/2015  . Diarrhea 09/04/2013  . Dyspnea   . Elbow pain, left 03/17/2017  . GERD (gastroesophageal reflux disease)   . Gout   . Headache   . Heart murmur   . Hx of colonic polyps   . Hypertension   . Hypertriglyceridemia 03/17/2017  . Hypopotassemia   . Otalgia 11/28/2013  . Other chronic pulmonary heart diseases   . Preventative health care 06/29/2016  . Red eye 03/03/2016   right  . Renal insufficiency 07/18/2013  . Seizures ([place])   . Sleep apnea    does not wear CPAP  . Thrombocytopenia ([place]) 06/29/2016  . Unspecified hypothyroidism 01/25/2013  . Unspecified pleural effusion     Past Surgical History:  Procedure Laterality Date  . CARDIAC CATHETERIZATION N/A 12/21/2015   Procedure: Right Heart Cath;  Surgeon: Jolaine Artist, MD;  Location: [place] [facility];  Service: Cardiovascular;  Laterality: N/A;  . CARDIAC CATHETERIZATION N/A 04/15/2016   Procedure: Right/Left Heart Cath and Coronary Angiography;  Surgeon: Jolaine Artist, MD;  Location: [place] [facility];  Service: Cardiovascular;  Laterality: N/A;  . COLONOSCOPY N/A 12/08/2013   Procedure: COLONOSCOPY;  Surgeon: Milus Banister, MD;  Location: [facility];  Service: Endoscopy;  Laterality: N/A;  . ESOPHAGOGASTRODUODENOSCOPY (EGD) WITH PROPOFOL N/A 03/27/2016   Procedure: ESOPHAGOGASTRODUODENOSCOPY (EGD) WITH PROPOFOL;  Surgeon: Milus Banister, MD;   Location: [place];  Service: Endoscopy;  Laterality: N/A;  . ESOPHAGOGASTRODUODENOSCOPY (EGD) WITH PROPOFOL N/A 02/14/2019   Procedure: ESOPHAGOGASTRODUODENOSCOPY (EGD) WITH PROPOFOL;  Surgeon: Jerene Bears, MD;  Location[facility];  Service: Gastroenterology;  Laterality: N/A;  . LOOP RECORDER INSERTION N/A 01/01/2017   Procedure: Loop Recorder Insertion;  Surgeon: Thompson Grayer, MD;  Location: [place] [facility];  Service: Cardiovascular;  Laterality: N/A;  . RIGHT HEART CATHETERIZATION Right 06/06/2014   Procedure: RIGHT HEART CATH;  Surgeon: Jolaine Artist, MD;  Location: [facility] CATH LAB;  Service: Cardiovascular;  Laterality: Right;  . UVULOPALATOPHARYNGOPLASTY  1999    Current Outpatient Medications  Medication Sig Dispense Refill  . Albuterol Sulfate (PROAIR RESPICLICK) 433 (90 Base) MCG/ACT AEPB Inhale 2 puffs into the lungs every 6 (six) hours as needed (SOB or wheezing). 3 each 1  . allopurinol (ZYLOPRIM) 100 MG tablet Take 2 tablets (200 mg total) by mouth daily. 180 tablet 1  . azelastine (ASTELIN) 0.1 % nasal spray Place 2 sprays into both nostrils 2 (two) times daily. Use in each nostril as directed (Patient taking differently: Place 2 sprays into both nostrils daily  as needed for rhinitis or allergies. ) 30 mL 1  . Blood Pressure Monitoring (BLOOD PRESSURE CUFF) MISC Use as directed. Check blood pressure bid prn Dx:I10 1 each 0  . diphenoxylate-atropine (LOMOTIL) 2.5-0.025 MG tablet Take 1 tablet by mouth 3 (three) times daily. (Patient taking differently: Take 1 tablet by mouth 4 (four) times daily as needed for diarrhea or loose stools. ) 60 tablet 0  . fluticasone-salmeterol (ADVAIR HFA) 230-21 MCG/ACT inhaler Inhale 2 puffs into the lungs every morning. 48 g 1  . folic acid (FOLVITE) 1 MG tablet Take 1 tablet (1 mg total) by mouth daily. 30 tablet 0  . furosemide (LASIX) 40 MG tablet Take 1 tablet (40 mg total) by mouth daily. 30 tablet 0  . gatifloxacin (ZYMAXID) 0.5 %  SOLN Place 1 drop into the right eye daily.    . hyoscyamine (LEVSIN SL) 0.125 MG SL tablet Place 1 tablet (0.125 mg total) under the tongue every 6 (six) hours as needed. (Patient taking differently: Place 0.125 mg under the tongue every 6 (six) hours as needed for cramping. ) 90 tablet 3  . macitentan (OPSUMIT) 10 MG tablet Take 1 tablet (10 mg total) by mouth daily. 30 tablet 11  . pantoprazole (PROTONIX) 40 MG tablet Take 1 tablet (40 mg total) by mouth daily before breakfast. 30 tablet 0  . potassium chloride (K-DUR) 10 MEQ tablet Take 2 tablets (20 mEq total) by mouth daily. 60 tablet 3  . rosuvastatin (CRESTOR) 5 MG tablet Take 1 tablet (5 mg total) by mouth daily. 90 tablet 3  . Selexipag (UPTRAVI) 1000 MCG TABS Take 1,000 mcg by mouth 2 (two) times daily. 60 tablet 11  . spironolactone (ALDACTONE) 50 MG tablet TAKE 1 TABLET DAILY (Patient taking differently: Take 50 mg by mouth daily. ) 90 tablet 1  . tadalafil, PAH, (ADCIRCA) 20 MG tablet TAKE 2 TABLETS (40 MG TOTAL) DAILY (Patient taking differently: Take 40 mg by mouth daily. ) 60 tablet 12  . thiamine 100 MG tablet Take 1 tablet (100 mg total) by mouth daily. 30 tablet 0  . tiZANidine (ZANAFLEX) 2 MG tablet Take 0.5-2 tablets (1-4 mg total) by mouth every 6 (six) hours as needed for muscle spasms. 30 tablet 0   No current facility-administered medications for this visit.     Allergies as of 03/08/2019 - Review Complete 03/08/2019  Allergen Reaction Noted  . Aspirin Other (See Comments) 03/21/2011  . Nitroglycerin  05/07/2017    Family History  Problem Relation Age of Onset  . Heart disease Mother   . Heart attack Mother   . Hypertension Mother   . Kidney failure Mother   . Liver disease Mother        stage 4 liver disease  . Prostate cancer Father   . Cancer Father        lung  . Hypertension Brother   . Gout Brother   . Alcoholism Maternal Grandfather   . Liver disease Maternal Grandfather   . Migraines Sister   .  Hypertension Brother   . Colon cancer Neg Hx     Social History   Socioeconomic History  . Marital status: Divorced    Spouse name: Not on file  . Number of children: 3  . Years of education: Not on file  . Highest education level: Not on file  Occupational History  . Occupation: Retired    Comment: Microbiologist  . Financial resource strain: Not hard at  all  . Food insecurity:    Worry: Never true    Inability: Never true  . Transportation needs:    Medical: No    Non-medical: No  Tobacco Use  . Smoking status: Former Smoker    Packs/day: 2.00    Years: 5.00    Pack years: 10.00    Types: Cigarettes    Last attempt to quit: 09/22/1979    Years since quitting: 39.4  . Smokeless tobacco: Never Used  Substance and Sexual Activity  . Alcohol use: Yes    Alcohol/week: 6.0 standard drinks    Types: 6 Shots of liquor per week    Comment: social drinker  . Drug use: No  . Sexual activity: Yes    Birth control/protection: Spermicide  Lifestyle  . Physical activity:    Days per week: 0 days    Minutes per session: 0 min  . Stress: Rather much  Relationships  . Social connections:    Talks on phone: More than three times a week    Gets together: Three times a week    Attends religious service: More than 4 times per year    Active member of club or organization: Yes    Attends meetings of clubs or organizations: More than 4 times per year    Relationship status: Divorced  . Intimate partner violence:    Fear of current or ex partner: Not on file    Emotionally abused: Not on file    Physically abused: Not on file    Forced sexual activity: Not on file  Other Topics Concern  . Not on file  Social History Narrative   0 caffeine drinks daily      Physical Exam: Unable to perform because this was a "telemed visit" due to current Covid-19 pandemic  Assessment and plan: 68 y.o. male with decompensated cirrhosis  Etiology of his cirrhosis is likely  alcohol and possibly cardiac related given his severe pulmonary artery hypertension.  He continues to intermittently drink alcohol but does not admit to drinking very much.  Hard to know what to believe.  He has not had any more bleeding since discharge from the hospital about a month ago.  He is clearly intolerant of nonselective beta-blockers, had significant dizziness even with very low-dose nadolol.  Current meld score is 17.  Cardiology has been managing his diuretics given his severe pulmonary artery hypertension.  He is due for high risk polyp surveillance, he has had fairly good sized precancerous polyps in the past and was due for recall several months ago.  He has chronically low platelets and prior to the colonoscopy I will have him start a 7-day course of [place] and will check his platelets the day of the colonoscopy which I think is safest to be done at the hospital given his severe cardiac issues.  Please see the "Patient Instructions" section for addition details about the plan.  Owens Loffler, MD [facility] 03/08/2019, 3:46 PM

## 2019-03-09 ENCOUNTER — Ambulatory Visit (HOSPITAL_COMMUNITY)
Admission: RE | Admit: 2019-03-09 | Discharge: 2019-03-09 | Disposition: A | Discharge status: Home / Self Care | Payer: Medicare Other | Source: Ambulatory Visit | Attending: Internal Medicine | Admitting: Internal Medicine

## 2019-03-09 ENCOUNTER — Encounter (HOSPITAL_COMMUNITY): Payer: Self-pay

## 2019-03-09 MED FILL — POTASSIUM CL ER 10 MEQ TAB: 10 | 30 days supply | Qty: 60 | Fill #3

## 2019-03-09 MED FILL — OSCIMIN SL 0.125 MG TABLET: 0.125 | 23 days supply | Qty: 90 | Fill #1

## 2019-03-09 MED FILL — PREDNISOLONE AC 1% EYE DROP: 1 | 30 days supply | Qty: 5 | Fill #0

## 2019-03-09 MED FILL — MULPLETA 3 MG TABS: 3 | 7 days supply | Qty: 7 | Fill #0

## 2019-03-10 ENCOUNTER — Other Ambulatory Visit: Payer: Self-pay | Admitting: Gastroenterology

## 2019-03-10 ENCOUNTER — Other Ambulatory Visit: Payer: Self-pay

## 2019-03-10 DIAGNOSIS — Z01818 Encounter for other preprocedural examination: Secondary | ICD-10-CM | POA: Diagnosis not present

## 2019-03-10 DIAGNOSIS — H2512 Age-related nuclear cataract, left eye: Secondary | ICD-10-CM | POA: Diagnosis not present

## 2019-03-11 ENCOUNTER — Ambulatory Visit (HOSPITAL_COMMUNITY)
Admission: RE | Admit: 2019-03-11 | Discharge: 2019-03-11 | Disposition: A | Discharge status: Home / Self Care | Payer: Medicare Other | Source: Ambulatory Visit | Attending: Internal Medicine | Admitting: Internal Medicine

## 2019-03-11 ENCOUNTER — Other Ambulatory Visit: Payer: Self-pay

## 2019-03-11 DIAGNOSIS — I5032 Chronic diastolic (congestive) heart failure: Secondary | ICD-10-CM | POA: Diagnosis not present

## 2019-03-11 DIAGNOSIS — I480 Paroxysmal atrial fibrillation: Secondary | ICD-10-CM

## 2019-03-11 DIAGNOSIS — N183 Chronic kidney disease, stage 3 unspecified: Secondary | ICD-10-CM

## 2019-03-11 DIAGNOSIS — I2721 Secondary pulmonary arterial hypertension: Secondary | ICD-10-CM

## 2019-03-11 NOTE — Progress Notes (Signed)
Heart Failure TeleHealth Note  Due to national recommendations of social distancing due to [place] 19, Audio/video telehealth visit is felt to be most appropriate for this patient at this time.  See MyChart message from today for patient consent regarding telehealth for [facility].  Date:  03/11/2019   ID:  Darren Mueller, DOB 10/27/1951, MRN [id number]  Location: Home  Provider location: [facility] Type of Visit: Established patient  PCP:  Mosie Lukes, MD  Cardiologist:  No primary care provider on file. Primary HF: [place]  Chief Complaint: Heart Failure follow-up   History of Present Illness:  Darren Mueller ("[name]") is a 68 yo with history of COPD, portopulmonary hypertension with RV failure, and ETOH cirrhosis.  Admitted in 6/17 with CP and SOB. Was diuresed. Troponins normal. Underwent R/L cath. Minimal CAD with moderate PAH and preserved cardiac output.   Admitted 06/2016 and 07/2016 with syncope.  On September admission found to have elevated ETOH level as well as R 6th rib fracture.   Wore 30 day monitor up until 08/28/16. No AF noted. No events.   Admitted February 2018 after syncopal event. LINQ placed.   Admitted 7/4 through 05/08/17 with volume depletion. Diuretics held and changed to as needed for weight 200 pounds or greater. Discharge weight was 194 pounds.   Was admitted 4/20 with hematochezia and melena. His hemoglobin remained stable.  No episodes of GI bleed after hospitalization.  Underwent EGD by GI with finding of Grade 1 and a small esophageal varices, no evidence of recent bleeding, portal hypertensive gastropathy. Started on nadolol which made him very dizzy. He stopped this and feels a lot    PAH meds 1) Macitnentan 10 mg daily 2) Adcirca 40 mg daily  3) Selexipeg  1000/1200 daily  Presents today for telehealth visit in setting of [place] pandemic. I did a teleheath visit with him 2 weeks ago and fluid was  up. Lasix doubled to 40 bid for 2-3 days. Went down to 202 and now back to 213.  (was 190s in our office previously). + recurrentLE edema. SOB was better when he was 202 but not SOB with walking to mailbox. + orthopnea. No CP. No syncope. Not checking BP lately.   Huy Majid denies symptoms worrisome for COVID 19.    Studies: ECHO 12/14 EF 55-60% Peak PA pressure 35. Severe RV dysfunction  ECHO 7/15 EF 60% RV moderately to severely dilated. Moderate HK RVSP 48m HG  ECHO 6/16 EF 60% RV moderately to severely dilated. Severe HK RVSP ~65 mm HG. D-shaped septum Echo 2/17 LVEF 60-65% RV massively dilated. Flat septum. Severe HK. Moderate TR RVSP ~65. IVC small. No effusion  ECHO 01/01/2017 EF 50-55% Grade I DD. RV severely dilated. Peak PA pressure 62 mm hg Echo 9/19 with EF 60-65% RV markedly dilated and hypokinetic with D-shaped septum. RVSP 632mG  Cath 6/17 Mid RCA lesion, 20% stenosed. Dist LAD lesion, 20% stenosed. Ao = 107/70 (87) LV = 106/3/11 RA = 4 RV = 74/11 PA = 74/26 (45) PCW = 6 Fick cardiac output/index = 4.6.2.2 PVR = 8.5 Ao sat = 95% PA sat = 61%, 58%  RHC 2/17 RA = 11 RV = 80/16/19 PA = 83/61 (71) PCW = 9 Fick cardiac output/index = 5.6/2.6 PVR = 10.3 WU Ao sat = 94% PA sat = 65%, 68% High SVC sat = 65%   PFTs 3/14 FEV1 2.02 L (58%) FVC 2.7L (54%) FEV1/FVC (89%) DLCO  53%  PFTs 1/17 FEV1 2.05 L (61%) FVC 2.48L (56%) DLCO 44%   Ab u/s 8/16: + cirrhosis/mild to moderate splenomegaly  6 min walk 01/10/13, 1290 feet  6MW (01/10/14) = 1280 feet (380 m) 6MW (9/15) = 1350 feet (411 m) O2 sats ranged from 89-95% on room air, HR ranged from 100-129. 6MW (6/16) = 384 meters  6MW (2/17) = 1250 feet (36m 6MW (8/17) = 1320 feet       Past Medical History:  Diagnosis Date  . Alcohol dependence ([facility] 03/22/2011   In remission   . Alcohol dependence in remission ([facility] 03/22/2011   In remission   . Alcoholic cirrhosis of liver with ascites  ([facility] 2014  . Anginal pain ([facility]   . CHF (congestive heart failure) ([facility]   . COPD (chronic obstructive pulmonary disease) ([facility]   . Coronary artery disease   . Dermatitis 06/10/2015  . Diarrhea 09/04/2013  . Dyspnea   . Elbow pain, left 03/17/2017  . GERD (gastroesophageal reflux disease)   . Gout   . Headache   . Heart murmur   . Hx of colonic polyps   . Hypertension   . Hypertriglyceridemia 03/17/2017  . Hypopotassemia   . Otalgia 11/28/2013  . Other chronic pulmonary heart diseases   . Preventative health care 06/29/2016  . Red eye 03/03/2016   right  . Renal insufficiency 07/18/2013  . Seizures ([facility]   . Sleep apnea    does not wear CPAP  . Thrombocytopenia ([facility] 06/29/2016  . Unspecified hypothyroidism 01/25/2013  . Unspecified pleural effusion    Past Surgical History:  Procedure Laterality Date  . CARDIAC CATHETERIZATION N/A 12/21/2015   Procedure: Right Heart Cath;  Surgeon: DJolaine Artist MD;  [facility];  Service: Cardiovascular;  Laterality: N/A;  . CARDIAC CATHETERIZATION N/A 04/15/2016   Procedure: Right/Left Heart Cath and Coronary Angiography;  Surgeon: DJolaine Artist MD;  [place] [facility];  Service: Cardiovascular;  Laterality: N/A;  . COLONOSCOPY N/A 12/08/2013   Procedure: COLONOSCOPY;  Surgeon: DMilus Banister MD;  [facility];  Service: Endoscopy;  Laterality: N/A;  . ESOPHAGOGASTRODUODENOSCOPY (EGD) WITH PROPOFOL N/A 03/27/2016   Procedure: ESOPHAGOGASTRODUODENOSCOPY (EGD) WITH PROPOFOL;  Surgeon: DMilus Banister MD;  [facility]  Service: Endoscopy;  Laterality: N/A;  . ESOPHAGOGASTRODUODENOSCOPY (EGD) WITH PROPOFOL N/A 02/14/2019   Procedure: ESOPHAGOGASTRODUODENOSCOPY (EGD) WITH PROPOFOL;  Surgeon: PJerene Bears MD;  [facility];  Service: Gastroenterology;  Laterality: N/A;  . LOOP RECORDER INSERTION N/A 01/01/2017   Procedure: Loop Recorder Insertion;  Surgeon: JThompson Grayer MD;  [facility];  Service: Cardiovascular;  Laterality: N/A;  . RIGHT HEART CATHETERIZATION Right 06/06/2014   Procedure: RIGHT HEART CATH;  Surgeon: DJolaine Artist MD;  [facility];  Service: Cardiovascular;  Laterality: Right;  . UVULOPALATOPHARYNGOPLASTY  1999     Current Outpatient Medications  Medication Sig Dispense Refill  . Albuterol Sulfate (PROAIR RESPICLICK) 1248(90 Base) MCG/ACT AEPB Inhale 2 puffs into the lungs every 6 (six) hours as needed (SOB or wheezing). 3 each 1  . allopurinol (ZYLOPRIM) 100 MG tablet Take 2 tablets (200 mg total) by mouth daily. 180 tablet 1  . azelastine (ASTELIN) 0.1 % nasal spray Place 2 sprays into both nostrils 2 (two) times daily. Use in each nostril as directed (Patient taking differently: Place 2 sprays into both nostrils daily as needed for rhinitis or allergies. ) 30 mL 1  .  Blood Pressure Monitoring (BLOOD PRESSURE CUFF) MISC Use as directed. Check blood pressure bid prn Dx:I10 1 each 0  . diphenoxylate-atropine (LOMOTIL) 2.5-0.025 MG tablet Take 1 tablet by mouth 3 (three) times daily. (Patient taking differently: Take 1 tablet by mouth 4 (four) times daily as needed for diarrhea or loose stools. ) 60 tablet 0  . fluticasone-salmeterol (ADVAIR HFA) 230-21 MCG/ACT inhaler Inhale 2 puffs into the lungs every morning. 48 g 1  . folic acid (FOLVITE) 1 MG tablet Take 1 tablet (1 mg total) by mouth daily. 30 tablet 0  . furosemide (LASIX) 40 MG tablet Take 1 tablet (40 mg total) by mouth daily. 30 tablet 0  . gatifloxacin (ZYMAXID) 0.5 % SOLN Place 1 drop into the right eye daily.    . hyoscyamine (LEVSIN SL) 0.125 MG SL tablet Place 1 tablet (0.125 mg total) under the tongue every 6 (six) hours as needed. (Patient taking differently: Place 0.125 mg under the tongue every 6 (six) hours as needed for cramping. ) 90 tablet 3  . Lusutrombopag (MULPLETA) 3 MG TABS Take 3 mg by mouth daily. Start medication on may 9th take daily for 7 days 7  tablet 0  . macitentan (OPSUMIT) 10 MG tablet Take 1 tablet (10 mg total) by mouth daily. 30 tablet 11  . pantoprazole (PROTONIX) 40 MG tablet Take 1 tablet (40 mg total) by mouth daily before breakfast. 30 tablet 0  . polyethylene glycol-electrolytes (NULYTELY/GOLYTELY) 420 g solution Take 4,000 mLs by mouth as directed. 4000 mL 0  . potassium chloride (K-DUR) 10 MEQ tablet Take 2 tablets (20 mEq total) by mouth daily. 60 tablet 3  . rosuvastatin (CRESTOR) 5 MG tablet Take 1 tablet (5 mg total) by mouth daily. 90 tablet 3  . Selexipag (UPTRAVI) 1000 MCG TABS Take 1,000 mcg by mouth 2 (two) times daily. 60 tablet 11  . spironolactone (ALDACTONE) 50 MG tablet TAKE 1 TABLET DAILY (Patient taking differently: Take 50 mg by mouth daily. ) 90 tablet 1  . tadalafil, PAH, (ADCIRCA) 20 MG tablet TAKE 2 TABLETS (40 MG TOTAL) DAILY (Patient taking differently: Take 40 mg by mouth daily. ) 60 tablet 12  . thiamine 100 MG tablet Take 1 tablet (100 mg total) by mouth daily. 30 tablet 0  . tiZANidine (ZANAFLEX) 2 MG tablet Take 0.5-2 tablets (1-4 mg total) by mouth every 6 (six) hours as needed for muscle spasms. 30 tablet 0   No current facility-administered medications for this encounter.     Allergies:   Aspirin and Nitroglycerin   Social History:  The patient  reports that he quit smoking about 39 years ago. His smoking use included cigarettes. He has a 10.00 pack-year smoking history. He has never used smokeless tobacco. He reports current alcohol use of about 6.0 standard drinks of alcohol per week. He reports that he does not use drugs.   Family History:  The patient's family history includes Alcoholism in his maternal grandfather; Cancer in his father; Gout in his brother; Heart attack in his mother; Heart disease in his mother; Hypertension in his brother, brother, and mother; Kidney failure in his mother; Liver disease in his maternal grandfather and mother; Migraines in his sister; Prostate cancer in  his father.   ROS:  Please see the history of present illness.   All other systems are personally reviewed and negative.   Exam:  (Video/Tele Health Call; Exam is subjective and or/visual.) General:  Speaks in full sentences. No resp difficulty. Lungs:  Normal respiratory effort with conversation.  Abdomen: Non-distended per patient report Extremities: Pt endorses edema. Neuro: Alert & oriented x 3.   Recent Labs: 08/09/2018: TSH 2.39 09/27/2018: Pro B Natriuretic peptide (BNP) 415.0 02/12/2019: B Natriuretic Peptide 158.1 02/13/2019: Magnesium 1.6 02/22/2019: ALT 29; BUN 35; Creatinine, Ser 1.84; Hemoglobin 9.4; Platelets 45.0 Repeated and verified X2.; Potassium 4.2; Sodium 139  Personally reviewed   Wt Readings from Last 3 Encounters:  03/08/19 92.5 kg (204 lb)  02/14/19 88.9 kg (196 lb)  12/30/18 92.5 kg (204 lb)      ASSESSMENT AND PLAN:  1. PAH: Suspected portopulmonary HTN in setting of ETOH cirrhosis. - Stable NYHA III. 6MW 9/19 1280 feet (390 meters) - Echo 9/19 with EF 60-65% RV markedly dilated and hypokinetic with D-shaped septum  - Continue selexipag 1000 mcg twice a day. Intolerant higher dose due to diarrhea and headaches .  - Continue macitentan 10 daily and adcirca 40 mg daily. - With RV strain on echo may need to consider IV therapies in the near future.  - Discussed need for complete ETOH cessation.  - Volume status appears elevated by report. Responded well to doubling lasix to 40 bid fo 2-3 days but now back up. Will increase lasix to 40 bid and keep I there. (can hold doses as needed if weight too low). Check labs next Firday - F/u televist 2 weeks  2. CKD III:  - Baseline creatinine 1.5-2.0 - Followed by Dr. Marval Regal  - Repeat labs next week  3. Cirrhosis:  - Likely combination of RV failure and ETOH - Follows with Dr. Ardis Hughs. Has appt in 2 weeks - Did not tolerate nadolol.  - Recent EGC 4/20 stable - Stressed need for ETOH cessation  4. Recurrent  syncope/seizure - by history event sounds like a seizure but need to make sure not cardiac source. Seen in ED 1/15. ETOH level high,. CT brain ok.  - LINQ interrogations have shown no arrhythmias and thus most likely seizure - Continue Keppra. Follows with Dr. Delice Lesch  5. HTN:  - Not checking BP routinely. Have encouraged closer f/u   6. OSA:  - Has mild OSA with AHI 12 and desats down to 82%.  - Intolerant CPAP.   7. Pancytopenia:  - Has seen Dr. Alen Blew in Hematology - felt like it is due to splenic sequestration.  - no bleeding currently  COVID screen The patient does not have any symptoms that suggest any further testing/ screening at this time.  Social distancing reinforced today.  Recommended follow-up:  As above  Relevant cardiac medications were reviewed at length with the patient today.   The patient does not have concerns regarding their medications at this time.   The following changes were made today:  As above  Today, I have spent 17 minutes with the patient with telehealth technology discussing the above issues .    Signed, Glori Bickers, MD  03/11/2019 12:03 PM  Advanced [facility] Failure [place] [address] [phone] (office) [phone] (fax)

## 2019-03-14 ENCOUNTER — Ambulatory Visit (INDEPENDENT_AMBULATORY_CARE_PROVIDER_SITE_OTHER): Payer: Medicare Other | Admitting: *Deleted

## 2019-03-14 ENCOUNTER — Encounter (HOSPITAL_COMMUNITY): Payer: Self-pay | Admitting: *Deleted

## 2019-03-14 ENCOUNTER — Other Ambulatory Visit: Payer: Self-pay | Admitting: Gastroenterology

## 2019-03-14 ENCOUNTER — Other Ambulatory Visit: Payer: Self-pay

## 2019-03-14 DIAGNOSIS — I5032 Chronic diastolic (congestive) heart failure: Secondary | ICD-10-CM | POA: Diagnosis not present

## 2019-03-14 DIAGNOSIS — R55 Syncope and collapse: Secondary | ICD-10-CM

## 2019-03-14 LAB — CUP PACEART REMOTE DEVICE CHECK
Date Time Interrogation Session: 20200511104231
Implantable Pulse Generator Implant Date: 20180301

## 2019-03-14 MED ORDER — FUROSEMIDE 40 MG PO TABS: 40.0000 mg | ORAL_TABLET | Freq: Two times a day (BID) | ORAL | 3 refills | Status: DC

## 2019-03-14 MED FILL — FUROSEMIDE 40 MG TAB: 40 | 30 days supply | Qty: 60 | Fill #0

## 2019-03-14 NOTE — Patient Instructions (Signed)
Increase Furosemide (Lasix) to 40 mg Twice daily   Your physician recommends that you return for lab work in: end of the week, please call our office  to schedule  Your physician recommends that you schedule a follow-up appointment in: 1 month with telehealth visit  If you have any questions or concerns before your next appointment please send [place] a message through [place] or call our office at [phone].

## 2019-03-14 NOTE — Progress Notes (Signed)
Have been unable to reach pt via phone, have Left message to call back on Friday 5/8 PM and again today.  Orders placed and new rx for Lasix sent to pharmacy, AVS sent via mychart, message sent to schedulers to arrange labs and f/u visit

## 2019-03-14 NOTE — Addendum Note (Signed)
Encounter addended by: Scarlette Calico, RN on: 03/14/2019 2:37 PM  Actions taken: Pharmacy for encounter modified, Order list changed, Diagnosis association updated, Clinical Note Signed

## 2019-03-15 ENCOUNTER — Telehealth (HOSPITAL_COMMUNITY): Payer: Self-pay | Admitting: *Deleted

## 2019-03-15 HISTORY — PX: EYE SURGERY: SHX253

## 2019-03-15 NOTE — Telephone Encounter (Signed)
Received fax from [facility]. Pt is sch for cataract extraction under IV sedation with Dr Alanda Slim on 03/17/2019 and they need clearance from our office for this.  Will send to Dr Haroldine Laws for recommendations.

## 2019-03-15 NOTE — Telephone Encounter (Signed)
He can use light sedation but nothing more. He is at high risk with his pulmonary HTN

## 2019-03-16 NOTE — Telephone Encounter (Signed)
Cataract Extraction surg clearance signed by DM (light sadation only/high risk w/ pul htn) faxed to [facility]

## 2019-03-17 ENCOUNTER — Telehealth: Payer: Self-pay

## 2019-03-17 DIAGNOSIS — H25812 Combined forms of age-related cataract, left eye: Secondary | ICD-10-CM | POA: Diagnosis not present

## 2019-03-17 DIAGNOSIS — H2512 Age-related nuclear cataract, left eye: Secondary | ICD-10-CM | POA: Diagnosis not present

## 2019-03-17 MED FILL — GATIFLOXACIN 0.5% EYE DROPS: 0.5 | 15 days supply | Qty: 5 | Fill #0

## 2019-03-17 NOTE — Telephone Encounter (Signed)
The pt needs to go to [facility] on 5/18 between 9-3 for Covid screening prior to 5/21 procedure. Left message on machine to call back

## 2019-03-17 NOTE — Telephone Encounter (Signed)
The patient has been notified of this information and all questions answered. The pt has been advised of the information and verbalized understanding.

## 2019-03-18 ENCOUNTER — Ambulatory Visit (HOSPITAL_COMMUNITY)
Admission: RE | Admit: 2019-03-18 | Discharge: 2019-03-18 | Disposition: A | Discharge status: Home / Self Care | Payer: Medicare Other | Source: Ambulatory Visit | Attending: Internal Medicine | Admitting: Internal Medicine

## 2019-03-18 ENCOUNTER — Other Ambulatory Visit: Payer: Self-pay

## 2019-03-18 DIAGNOSIS — I5032 Chronic diastolic (congestive) heart failure: Secondary | ICD-10-CM | POA: Diagnosis not present

## 2019-03-18 DIAGNOSIS — I480 Paroxysmal atrial fibrillation: Secondary | ICD-10-CM

## 2019-03-18 LAB — BASIC METABOLIC PANEL
Anion gap: 8 (ref 5–15)
BUN: 33 mg/dL — ABNORMAL HIGH (ref 8–23)
CO2: 22 mmol/L (ref 22–32)
Calcium: 8.5 mg/dL — ABNORMAL LOW (ref 8.9–10.3)
Chloride: 108 mmol/L (ref 98–111)
Creatinine, Ser: 2.25 mg/dL — ABNORMAL HIGH (ref 0.61–1.24)
GFR calc Af Amer: 33 mL/min — ABNORMAL LOW (ref >60)
GFR calc non Af Amer: 29 mL/min — ABNORMAL LOW (ref >60)
Glucose, Bld: 108 mg/dL — ABNORMAL HIGH (ref 70–99)
Potassium: 3.6 mmol/L (ref 3.5–5.1)
Sodium: 138 mmol/L (ref 135–145)

## 2019-03-18 LAB — CBC
HCT: 29.4 % — ABNORMAL LOW (ref 39.0–52.0)
Hemoglobin: 8.8 g/dL — ABNORMAL LOW (ref 13.0–17.0)
MCH: 31.4 pg (ref 26.0–34.0)
MCHC: 29.9 g/dL — ABNORMAL LOW (ref 30.0–36.0)
MCV: 105 fL — ABNORMAL HIGH (ref 80.0–100.0)
Platelets: 37 10*3/uL — ABNORMAL LOW (ref 150–400)
RBC: 2.8 MIL/uL — ABNORMAL LOW (ref 4.22–5.81)
RDW: 16.5 % — ABNORMAL HIGH (ref 11.5–15.5)
WBC: 1.7 10*3/uL — ABNORMAL LOW (ref 4.0–10.5)
nRBC: 0 % (ref 0.0–0.2)

## 2019-03-18 NOTE — Progress Notes (Addendum)
Subjective:   Darren Mueller is a 68 y.o. male who presents for Medicare Annual/Subsequent preventive examination.  Product manager.  Review of Systems: See social history for additional risk factors. Cardiac Risk Factors include: advanced age (>92mn, >>6women);hypertension;male gender Sleep patterns: Doesn't sleep a lot. Mother is in hospice. A lot on his mind. Home Safety/Smoke Alarms: Feels safe in home. Smoke alarms in place.  Lives with son in 1 story home.   Male:   CCS- last 12/08/13. 5 yr recall. SCHEDULED FOR 03/24/19 PSA-  Lab Results  Component Value Date   PSA 0.49 11/01/2012       Objective:    Vitals: BP 132/80 (BP Location: Left Arm, Patient Position: Sitting, Cuff Size: Normal)   Pulse 91   Ht 6' (1.829 m)   Wt 216 lb 9.6 oz (98.2 kg)   SpO2 97%   BMI 29.38 kg/m   Body mass index is 29.38 kg/m.  Advanced Directives 02/14/2019 02/13/2019 02/12/2019 03/18/2018 11/18/2017 06/02/2017 05/21/2017  Does Patient Have a Medical Advance Directive? - Yes Yes Yes No Yes Yes  Type of Advance Directive Living will;Healthcare Power of [place] will [place] will - [place]Living will [place]Living will  Does patient want to make changes to medical advance directive? - No - Patient declined No - Patient declined No - Patient declined - - -  Copy of [place] Chart? No - copy requested No - copy requested No - copy requested No - copy requested - No - copy requested No - copy requested  Would patient like information on creating a medical advance directive? - No - Guardian declined No - Guardian declined - No - Patient declined No - Patient declined No - Patient declined  Pre-existing out of facility DNR order (yellow form or pink MOST form) - - - - - - -    Tobacco Social History   Tobacco Use   Smoking Status Former Smoker  . Packs/day: 2.00  . Years: 5.00  . Pack years: 10.00  . Types: Cigarettes  . Last attempt to quit: 09/22/1979  . Years since quitting: 39.5  Smokeless Tobacco Never Used     Counseling given: Not Answered   Clinical Intake:     Pain : No/denies pain                 Past Medical History:  Diagnosis Date  . Alcohol dependence ([place] 03/22/2011   In remission   . Alcohol dependence in remission ([place] 03/22/2011   In remission   . Alcoholic cirrhosis of liver with ascites ([place] 2014  . Anginal pain ([place]   . CHF (congestive heart failure) ([place]   . COPD (chronic obstructive pulmonary disease) ([place]   . Coronary artery disease   . Dermatitis 06/10/2015  . Diarrhea 09/04/2013  . Dyspnea   . Elbow pain, left 03/17/2017  . GERD (gastroesophageal reflux disease)   . Gout   . Headache   . Heart murmur   . Hx of colonic polyps   . Hypertension   . Hypertriglyceridemia 03/17/2017  . Hypopotassemia   . Otalgia 11/28/2013  . Other chronic pulmonary heart diseases   . Preventative health care 06/29/2016  . Red eye 03/03/2016   right  . Renal insufficiency 07/18/2013  . Seizures ([place]   . Sleep apnea    does not wear CPAP  .  Thrombocytopenia ([place]) 06/29/2016  . Unspecified hypothyroidism 01/25/2013  . Unspecified pleural effusion    Past Surgical History:  Procedure Laterality Date  . CARDIAC CATHETERIZATION N/A 12/21/2015   Procedure: Right Heart Cath;  Surgeon: Jolaine Artist, MD;  Location: [place] [facility];  Service: Cardiovascular;  Laterality: N/A;  . CARDIAC CATHETERIZATION N/A 04/15/2016   Procedure: Right/Left Heart Cath and Coronary Angiography;  Surgeon: Jolaine Artist, MD;  Location: [place] [facility];  Service: Cardiovascular;  Laterality: N/A;  . COLONOSCOPY N/A 12/08/2013   Procedure: COLONOSCOPY;  Surgeon: Milus Banister, MD;  Location: [facility];  Service: Endoscopy;  Laterality: N/A;  . ESOPHAGOGASTRODUODENOSCOPY  (EGD) WITH PROPOFOL N/A 03/27/2016   Procedure: ESOPHAGOGASTRODUODENOSCOPY (EGD) WITH PROPOFOL;  Surgeon: Milus Banister, MD;  Location: [place];  Service: Endoscopy;  Laterality: N/A;  . ESOPHAGOGASTRODUODENOSCOPY (EGD) WITH PROPOFOL N/A 02/14/2019   Procedure: ESOPHAGOGASTRODUODENOSCOPY (EGD) WITH PROPOFOL;  Surgeon: Jerene Bears, MD;  [facility] END[facility]COPY;  Service: Gastroenterology;  Laterality: N/A;  . EYE SURGERY Bilateral 03/15/2019   lasix  . LOOP RECORDER INSERTION N/A 01/01/2017   Procedure: Loop Recorder Insertion;  Surgeon: Thompson Grayer, MD;  Location: [place] [facility];  Service: Cardiovascular;  Laterality: N/A;  . RIGHT HEART CATHETERIZATION Right 06/06/2014   Procedure: RIGHT HEART CATH;  Surgeon: Jolaine Artist, MD;  Location: [facility] CATH LAB;  Service: Cardiovascular;  Laterality: Right;  . UVULOPALATOPHARYNGOPLASTY  1999   Family History  Problem Relation Age of Onset  . Heart disease Mother   . Heart attack Mother   . Hypertension Mother   . Kidney failure Mother   . Liver disease Mother        stage 4 liver disease  . Prostate cancer Father   . Cancer Father        lung  . Hypertension Brother   . Gout Brother   . Alcoholism Maternal Grandfather   . Liver disease Maternal Grandfather   . Migraines Sister   . Hypertension Brother   . Colon cancer Neg Hx    Social History   Socioeconomic History  . Marital status: Divorced    Spouse name: Not on file  . Number of children: 3  . Years of education: Not on file  . Highest education level: Not on file  Occupational History  . Occupation: Retired    Comment: Microbiologist  . Financial resource strain: Not hard at all  . Food insecurity:    Worry: Never true    Inability: Never true  . Transportation needs:    Medical: No    Non-medical: No  Tobacco Use  . Smoking status: Former Smoker    Packs/day: 2.00    Years: 5.00    Pack years: 10.00    Types: Cigarettes    Last attempt to  quit: 09/22/1979    Years since quitting: 39.5  . Smokeless tobacco: Never Used  Substance and Sexual Activity  . Alcohol use: Yes    Alcohol/week: 6.0 standard drinks    Types: 6 Shots of liquor per week    Comment: social drinker  . Drug use: No  . Sexual activity: Yes    Birth control/protection: Spermicide  Lifestyle  . Physical activity:    Days per week: 0 days    Minutes per session: 0 min  . Stress: Rather much  Relationships  . Social connections:    Talks on phone: More than three times a week  Gets together: Three times a week    Attends religious service: More than 4 times per year    Active member of club or organization: Yes    Attends meetings of clubs or organizations: More than 4 times per year    Relationship status: Divorced  Other Topics Concern  . Not on file  Social History Narrative   0 caffeine drinks daily     Outpatient Encounter Medications as of 03/21/2019  Medication Sig  . Albuterol Sulfate (PROAIR RESPICLICK) 161 (90 Base) MCG/ACT AEPB Inhale 2 puffs into the lungs every 6 (six) hours as needed (SOB or wheezing).  Marland Kitchen azelastine (ASTELIN) 0.1 % nasal spray Place 2 sprays into both nostrils 2 (two) times daily. Use in each nostril as directed (Patient taking differently: Place 2 sprays into both nostrils daily as needed for rhinitis or allergies. )  . Blood Pressure Monitoring (BLOOD PRESSURE CUFF) MISC Use as directed. Check blood pressure bid prn Dx:I10  . diphenoxylate-atropine (LOMOTIL) 2.5-0.025 MG tablet Take 1 tablet by mouth 3 (three) times daily. (Patient taking differently: Take 1 tablet by mouth 4 (four) times daily as needed for diarrhea or loose stools. )  . fluticasone-salmeterol (ADVAIR HFA) 230-21 MCG/ACT inhaler Inhale 2 puffs into the lungs every morning.  . folic acid (FOLVITE) 1 MG tablet Take 1 tablet (1 mg total) by mouth daily.  . furosemide (LASIX) 40 MG tablet Take 1 tablet (40 mg total) by mouth 2 (two) times daily.  Marland Kitchen  gatifloxacin (ZYMAXID) 0.5 % SOLN Place 1 drop into the right eye daily.  . hyoscyamine (LEVSIN SL) 0.125 MG SL tablet Place 1 tablet (0.125 mg total) under the tongue every 6 (six) hours as needed. (Patient taking differently: Place 0.125 mg under the tongue every 6 (six) hours as needed for cramping. )  . macitentan (OPSUMIT) 10 MG tablet Take 1 tablet (10 mg total) by mouth daily.  . pantoprazole (PROTONIX) 40 MG tablet Take 1 tablet (40 mg total) by mouth daily before breakfast.  . potassium chloride (K-DUR) 10 MEQ tablet Take 2 tablets (20 mEq total) by mouth daily.  . rosuvastatin (CRESTOR) 5 MG tablet Take 1 tablet (5 mg total) by mouth daily.  . Selexipag (UPTRAVI) 1000 MCG TABS Take 1,000 mcg by mouth 2 (two) times daily.  Marland Kitchen spironolactone (ALDACTONE) 50 MG tablet TAKE 1 TABLET DAILY (Patient taking differently: Take 50 mg by mouth daily. )  . tadalafil, PAH, (ADCIRCA) 20 MG tablet TAKE 2 TABLETS (40 MG TOTAL) DAILY (Patient taking differently: Take 40 mg by mouth daily. )  . thiamine 100 MG tablet Take 1 tablet (100 mg total) by mouth daily.  Marland Kitchen allopurinol (ZYLOPRIM) 100 MG tablet Take 2 tablets (200 mg total) by mouth daily.  . polyethylene glycol-electrolytes (NULYTELY/GOLYTELY) 420 g solution Take 4,000 mLs by mouth as directed. (Patient not taking: Reported on 03/21/2019)  . tiZANidine (ZANAFLEX) 2 MG tablet Take 0.5-2 tablets (1-4 mg total) by mouth every 6 (six) hours as needed for muscle spasms. (Patient not taking: Reported on 03/21/2019)  . [DISCONTINUED] Lusutrombopag (MULPLETA) 3 MG TABS Take 3 mg by mouth daily. Start medication on may 9th take daily for 7 days   No facility-administered encounter medications on file as of 03/21/2019.     Activities of Daily Living In your present state of health, do you have any difficulty performing the following activities: 03/21/2019 02/13/2019  Hearing? N -  Vision? N -  Comment wears readers. -  Difficulty concentrating or  making  decisions? N -  Walking or climbing stairs? N -  Dressing or bathing? N -  Doing errands, shopping? N N  Preparing Food and eating ? N -  Using the Toilet? N -  In the past six months, have you accidently leaked urine? N -  Do you have problems with loss of bowel control? N -  Managing your Medications? N -  Managing your Finances? N -  Housekeeping or managing your Housekeeping? N -  Some recent data might be hidden    Patient Care Team: Mosie Lukes, MD as PCP - General (Family Medicine) Elsie Stain, MD as Consulting Physician (Pulmonary Disease) Bensimhon, Shaune Pascal, MD as Consulting Physician (Cardiology) Wyatt Portela, MD as Consulting Physician (Oncology) Milus Banister, MD as Consulting Physician (Gastroenterology) Gerlene Burdock and [facility] Family Denstistry (Dentistry) Cameron Sprang, MD as Consulting Physician (Neurology)   Assessment:   This is a routine wellness examination for Reform. Physical assessment deferred to PCP.  Exercise Activities and Dietary recommendations Current Exercise Habits: The patient does not participate in regular exercise at present, Exercise limited by: None identified Diet (meal preparation, eat out, water intake, caffeinated beverages, dairy products, fruits and vegetables): well balanced, on average, 2 meals per day      Goals    . Maintain healthy lifestyle    . Reduce alcohol intake       Fall Risk Fall Risk  12/30/2018 05/07/2018 03/18/2018 01/25/2018 03/17/2017  Falls in the past year? 0 No No No Yes  Number falls in past yr: 0 - - - 1  Comment - - - - -  Injury with Fall? 0 - - - Yes    Depression Screen PHQ 2/9 Scores 03/18/2018 03/17/2017 06/20/2016 04/28/2016  PHQ - 2 Score 0 1 0 0  Exception Documentation - - - Patient refusal    Cognitive Function Ad8 score reviewed for issues:  Issues making decisions:no  Less interest in hobbies / activities:no  Repeats questions, stories (family complaining):no  Trouble using  ordinary gadgets (microwave, computer, phone):no  Forgets the month or year: no  Mismanaging finances: no  Remembering appts:no  Daily problems with thinking and/or memory:no Ad8 score is=0     MMSE - Mini Mental State Exam 03/17/2017  Orientation to time 5  Orientation to Place 5  Registration 3  Attention/ Calculation 4  Recall 3  Language- name 2 objects 2  Language- repeat 1  Language- follow 3 step command 3  Language- read & follow direction 1  Write a sentence 1  Copy design 1  Total score 29        Immunization History  Administered Date(s) Administered  . Hepatitis B 12/03/2012, 01/11/2013  . Hepatitis B, ped/adol 06/03/2013  . Influenza Split 08/28/2011, 11/03/2012  . Influenza, High Dose Seasonal PF 09/11/2016, 11/20/2017, 08/09/2018  . Influenza,inj,Quad PF,6+ Mos 07/18/2013, 07/25/2014, 09/03/2015  . Pneumococcal Conjugate-13 09/03/2015  . Pneumococcal Polysaccharide-23 08/30/2013  . Tdap 09/02/2013    Screening Tests Health Maintenance  Topic Date Due  . PNA vac Low Risk Adult (2 of 2 - PPSV23) 08/30/2018  . INFLUENZA VACCINE  06/04/2019  . TETANUS/TDAP  09/03/2023  . COLONOSCOPY  12/09/2023  . Hepatitis C Screening  Completed     Plan:   Pt concerns for PCP: pt reports not sleeping well. Has a lot on his mind. Mother in hospice. Requesting sleep aid rx. States he does not want Ambien.  See you next year!  Continue  to eat heart healthy diet (full of fruits, vegetables, whole grains, lean protein, water--limit salt, fat, and sugar intake) and increase physical activity as tolerated.  Continue doing brain stimulating activities (puzzles, reading, adult coloring books, staying active) to keep memory sharp.   Bring a copy of your living will and/or healthcare power of attorney to your next office visit.   I have personally reviewed and noted the following in the patient's chart:   . Medical and social history . Use of alcohol, tobacco or  illicit drugs  . Current medications and supplements . Functional ability and status . Nutritional status . Physical activity . Advanced directives . List of other physicians . Hospitalizations, surgeries, and ER visits in previous 12 months . Vitals . Screenings to include cognitive, depression, and falls . Referrals and appointments  In addition, I have reviewed and discussed with patient certain preventive protocols, quality metrics, and best practice recommendations. A written personalized care plan for preventive services as well as general preventive health recommendations were provided to patient.     Shela Nevin, [place]  03/21/2019  Medical screening examination/treatment was performed by qualified clinical staff member and as supervising physician I was immediately available for consultation/collaboration. I have reviewed documentation and agree with assessment and plan.  Penni Homans, MD

## 2019-03-21 ENCOUNTER — Ambulatory Visit (INDEPENDENT_AMBULATORY_CARE_PROVIDER_SITE_OTHER): Payer: Medicare Other | Admitting: *Deleted

## 2019-03-21 ENCOUNTER — Other Ambulatory Visit: Payer: Self-pay

## 2019-03-21 ENCOUNTER — Encounter: Payer: Self-pay | Admitting: *Deleted

## 2019-03-21 ENCOUNTER — Other Ambulatory Visit (HOSPITAL_COMMUNITY)
Admission: RE | Admit: 2019-03-21 | Discharge: 2019-03-21 | Disposition: A | Discharge status: Home / Self Care | Payer: Medicare Other | Source: Ambulatory Visit | Attending: Gastroenterology | Admitting: Gastroenterology

## 2019-03-21 VITALS — BP 132/80 | HR 91 | Ht 72.0 in | Wt 216.6 lb

## 2019-03-21 DIAGNOSIS — Z0000 Encounter for general adult medical examination without abnormal findings: Secondary | ICD-10-CM | POA: Diagnosis not present

## 2019-03-21 DIAGNOSIS — Z01812 Encounter for preprocedural laboratory examination: Secondary | ICD-10-CM | POA: Insufficient documentation

## 2019-03-21 DIAGNOSIS — Z1159 Encounter for screening for other viral diseases: Secondary | ICD-10-CM | POA: Diagnosis not present

## 2019-03-21 NOTE — Patient Instructions (Signed)
See you next year!  Continue to eat heart healthy diet (full of fruits, vegetables, whole grains, lean protein, water--limit salt, fat, and sugar intake) and increase physical activity as tolerated.  Continue doing brain stimulating activities (puzzles, reading, adult coloring books, staying active) to keep memory sharp.   Bring a copy of your living will and/or healthcare power of attorney to your next office visit.   Darren Mueller , Thank you for taking time to come for your Medicare Wellness Visit. I appreciate your ongoing commitment to your health goals. Please review the following plan we discussed and let me know if I can assist you in the future.   These are the goals we discussed: Goals    . Maintain healthy lifestyle    . Reduce alcohol intake       This is a list of the screening recommended for you and due dates:  Health Maintenance  Topic Date Due  . Pneumonia vaccines (2 of 2 - PPSV23) 08/30/2018  . Flu Shot  06/04/2019  . Tetanus Vaccine  09/03/2023  . Colon Cancer Screening  12/09/2023  .  Hepatitis C: One time screening is recommended by Center for Disease Control  (CDC) for  adults born from [birth year] through 1966.   Completed    Health Maintenance After Age 68 After age 70, you are at a higher risk for certain long-term diseases and infections as well as injuries from falls. Falls are a major cause of broken bones and head injuries in people who are older than age 17. Getting regular preventive care can help to keep you healthy and well. Preventive care includes getting regular testing and making lifestyle changes as recommended by your health care provider. Talk with your health care provider about:  Which screenings and tests you should have. A screening is a test that checks for a disease when you have no symptoms.  A diet and exercise plan that is right for you. What should I know about screenings and tests to prevent falls? Screening and testing are the best ways  to find a health problem early. Early diagnosis and treatment give you the best chance of managing medical conditions that are common after age 38. Certain conditions and lifestyle choices may make you more likely to have a fall. Your health care provider may recommend:  Regular vision checks. Poor vision and conditions such as cataracts can make you more likely to have a fall. If you wear glasses, make sure to get your prescription updated if your vision changes.  Medicine review. Work with your health care provider to regularly review all of the medicines you are taking, including over-the-counter medicines. Ask your health care provider about any side effects that may make you more likely to have a fall. Tell your health care provider if any medicines that you take make you feel dizzy or sleepy.  Osteoporosis screening. Osteoporosis is a condition that causes the bones to get weaker. This can make the bones weak and cause them to break more easily.  Blood pressure screening. Blood pressure changes and medicines to control blood pressure can make you feel dizzy.  Strength and balance checks. Your health care provider may recommend certain tests to check your strength and balance while standing, walking, or changing positions.  Foot health exam. Foot pain and numbness, as well as not wearing proper footwear, can make you more likely to have a fall.  Depression screening. You may be more likely to have a fall if  you have a fear of falling, feel emotionally low, or feel unable to do activities that you used to do.  Alcohol use screening. Using too much alcohol can affect your balance and may make you more likely to have a fall. What actions can I take to lower my risk of falls? General instructions  Talk with your health care provider about your risks for falling. Tell your health care provider if: ? You fall. Be sure to tell your health care provider about all falls, even ones that seem minor.  ? You feel dizzy, sleepy, or off-balance.  Take over-the-counter and prescription medicines only as told by your health care provider. These include any supplements.  Eat a healthy diet and maintain a healthy weight. A healthy diet includes low-fat dairy products, low-fat (lean) meats, and fiber from whole grains, beans, and lots of fruits and vegetables. Home safety  Remove any tripping hazards, such as rugs, cords, and clutter.  Install safety equipment such as grab bars in bathrooms and safety rails on stairs.  Keep rooms and walkways well-lit. Activity   Follow a regular exercise program to stay fit. This will help you maintain your balance. Ask your health care provider what types of exercise are appropriate for you.  If you need a cane or walker, use it as recommended by your health care provider.  Wear supportive shoes that have nonskid soles. Lifestyle  Do not drink alcohol if your health care provider tells you not to drink.  If you drink alcohol, limit how much you have: ? 0-1 drink a day for women. ? 0-2 drinks a day for men.  Be aware of how much alcohol is in your drink. In the [place] one drink equals one typical bottle of beer (12 oz), one-half glass of wine (5 oz), or one shot of hard liquor (1 oz).  Do not use any products that contain nicotine or tobacco, such as cigarettes and e-cigarettes. If you need help quitting, ask your health care provider. Summary  Having a healthy lifestyle and getting preventive care can help to protect your health and wellness after age 78.  Screening and testing are the best way to find a health problem early and help you avoid having a fall. Early diagnosis and treatment give you the best chance for managing medical conditions that are more common for people who are older than age 42.  Falls are a major cause of broken bones and head injuries in people who are older than age 16. Take precautions to prevent a fall at home.  Work  with your health care provider to learn what changes you can make to improve your health and wellness and to prevent falls. This information is not intended to replace advice given to you by your health care provider. Make sure you discuss any questions you have with your health care provider. Document Released: 09/02/2017 Document Revised: 09/02/2017 Document Reviewed: 09/02/2017 Elsevier Interactive Patient Education  2019 [employer].

## 2019-03-22 LAB — NOVEL CORONAVIRUS, NAA (HOSP ORDER, SEND-OUT TO REF LAB; TAT 18-24 HRS): SARS-CoV-2, NAA: NOT DETECTED

## 2019-03-23 ENCOUNTER — Other Ambulatory Visit: Payer: Self-pay

## 2019-03-23 ENCOUNTER — Encounter (HOSPITAL_COMMUNITY): Payer: Self-pay | Admitting: *Deleted

## 2019-03-23 NOTE — Progress Notes (Signed)
[facility] Summary Report / Loop Recorder

## 2019-03-23 NOTE — Anesthesia Preprocedure Evaluation (Addendum)
Anesthesia Evaluation  Patient identified by MRN, date of birth, ID band Patient awake    Reviewed: Allergy & Precautions, NPO status , Patient's Chart, lab work & pertinent test results  History of Anesthesia Complications Negative for: history of anesthetic complications  Airway Mallampati: II  TM Distance: >3 FB Neck ROM: Full    Dental no notable dental hx.    Pulmonary sleep apnea , COPD, former smoker,    Pulmonary exam normal        Cardiovascular hypertension, Pt. on medications + CAD and +CHF  Normal cardiovascular exam  TTE 9/19: EF 46-65%, grade 1 diastolic dysfunction, diastolic and systolic flattening of septum, mild AVR, moderate LAE, severe RV dilation with mildly reduced RV function, moderate RAE, moderate TVR, mild PVR, PASP severely increased at 22mHg     Neuro/Psych Seizures -,     GI/Hepatic GERD  ,(+) Cirrhosis       ,   Endo/Other  Hypothyroidism   Renal/GU Renal InsufficiencyRenal disease     Musculoskeletal negative musculoskeletal ROS (+)   Abdominal   Peds  Hematology  (+) anemia , Plts 37k, Hgb 8.8   Anesthesia Other Findings Day of surgery medications reviewed with the patient.  Reproductive/Obstetrics                            Anesthesia Physical Anesthesia Plan  ASA: IV  Anesthesia Plan: MAC   Post-op Pain Management:    Induction:   PONV Risk Score and Plan: Treatment may vary due to age or medical condition and Propofol infusion  Airway Management Planned: Natural Airway and Simple Face Mask  Additional Equipment:   Intra-op Plan:   Post-operative Plan:   Informed Consent: I have reviewed the patients History and Physical, chart, labs and discussed the procedure including the risks, benefits and alternatives for the proposed anesthesia with the patient or authorized representative who has indicated his/her understanding and acceptance.      Dental advisory given  Plan Discussed with: CRNA  Anesthesia Plan Comments:         Anesthesia Quick Evaluation

## 2019-03-24 ENCOUNTER — Ambulatory Visit (HOSPITAL_COMMUNITY): Payer: Medicare Other | Admitting: Physician Assistant

## 2019-03-24 ENCOUNTER — Encounter (HOSPITAL_COMMUNITY)
Admission: RE | Disposition: A | Discharge status: Home / Self Care | Payer: Self-pay | Source: Home / Self Care | Attending: Gastroenterology

## 2019-03-24 ENCOUNTER — Encounter (HOSPITAL_COMMUNITY): Payer: Self-pay | Admitting: *Deleted

## 2019-03-24 ENCOUNTER — Telehealth: Payer: Self-pay

## 2019-03-24 ENCOUNTER — Ambulatory Visit (HOSPITAL_COMMUNITY)
Admission: RE | Admit: 2019-03-24 | Discharge: 2019-03-24 | Disposition: A | Discharge status: Home / Self Care | Payer: Medicare Other | Attending: Gastroenterology | Admitting: Gastroenterology

## 2019-03-24 DIAGNOSIS — Z1211 Encounter for screening for malignant neoplasm of colon: Secondary | ICD-10-CM | POA: Insufficient documentation

## 2019-03-24 DIAGNOSIS — K7031 Alcoholic cirrhosis of liver with ascites: Secondary | ICD-10-CM | POA: Insufficient documentation

## 2019-03-24 DIAGNOSIS — I110 Hypertensive heart disease with heart failure: Secondary | ICD-10-CM | POA: Insufficient documentation

## 2019-03-24 DIAGNOSIS — I509 Heart failure, unspecified: Secondary | ICD-10-CM | POA: Diagnosis not present

## 2019-03-24 DIAGNOSIS — K219 Gastro-esophageal reflux disease without esophagitis: Secondary | ICD-10-CM | POA: Diagnosis not present

## 2019-03-24 DIAGNOSIS — F1020 Alcohol dependence, uncomplicated: Secondary | ICD-10-CM | POA: Diagnosis not present

## 2019-03-24 DIAGNOSIS — J449 Chronic obstructive pulmonary disease, unspecified: Secondary | ICD-10-CM | POA: Insufficient documentation

## 2019-03-24 DIAGNOSIS — M109 Gout, unspecified: Secondary | ICD-10-CM | POA: Diagnosis not present

## 2019-03-24 DIAGNOSIS — Z86010 Personal history of colonic polyps: Secondary | ICD-10-CM

## 2019-03-24 DIAGNOSIS — Z7951 Long term (current) use of inhaled steroids: Secondary | ICD-10-CM | POA: Insufficient documentation

## 2019-03-24 DIAGNOSIS — E785 Hyperlipidemia, unspecified: Secondary | ICD-10-CM | POA: Diagnosis not present

## 2019-03-24 DIAGNOSIS — K635 Polyp of colon: Secondary | ICD-10-CM | POA: Diagnosis not present

## 2019-03-24 DIAGNOSIS — G4730 Sleep apnea, unspecified: Secondary | ICD-10-CM | POA: Diagnosis not present

## 2019-03-24 DIAGNOSIS — D120 Benign neoplasm of cecum: Secondary | ICD-10-CM

## 2019-03-24 DIAGNOSIS — I272 Pulmonary hypertension, unspecified: Secondary | ICD-10-CM | POA: Insufficient documentation

## 2019-03-24 DIAGNOSIS — Z87891 Personal history of nicotine dependence: Secondary | ICD-10-CM | POA: Insufficient documentation

## 2019-03-24 DIAGNOSIS — Z79899 Other long term (current) drug therapy: Secondary | ICD-10-CM | POA: Insufficient documentation

## 2019-03-24 DIAGNOSIS — K766 Portal hypertension: Secondary | ICD-10-CM | POA: Diagnosis not present

## 2019-03-24 DIAGNOSIS — D696 Thrombocytopenia, unspecified: Secondary | ICD-10-CM | POA: Insufficient documentation

## 2019-03-24 DIAGNOSIS — I2510 Atherosclerotic heart disease of native coronary artery without angina pectoris: Secondary | ICD-10-CM | POA: Diagnosis not present

## 2019-03-24 DIAGNOSIS — K7030 Alcoholic cirrhosis of liver without ascites: Secondary | ICD-10-CM

## 2019-03-24 HISTORY — PX: COLONOSCOPY WITH PROPOFOL: SHX5780

## 2019-03-24 LAB — CBC
HCT: 29.4 % — ABNORMAL LOW (ref 39.0–52.0)
Hemoglobin: 9 g/dL — ABNORMAL LOW (ref 13.0–17.0)
MCH: 31.9 pg (ref 26.0–34.0)
MCHC: 30.6 g/dL (ref 30.0–36.0)
MCV: 104.3 fL — ABNORMAL HIGH (ref 80.0–100.0)
Platelets: 38 10*3/uL — ABNORMAL LOW (ref 150–400)
RBC: 2.82 MIL/uL — ABNORMAL LOW (ref 4.22–5.81)
RDW: 16.8 % — ABNORMAL HIGH (ref 11.5–15.5)
WBC: 2.2 10*3/uL — ABNORMAL LOW (ref 4.0–10.5)
nRBC: 0 % (ref 0.0–0.2)

## 2019-03-24 SURGERY — COLONOSCOPY WITH PROPOFOL
Anesthesia: Monitor Anesthesia Care

## 2019-03-24 MED ORDER — PROPOFOL 10 MG/ML IV BOLUS
INTRAVENOUS | Status: DC | PRN
  Administered 2019-03-24 (×2): 20 mg via INTRAVENOUS

## 2019-03-24 MED ORDER — SODIUM CHLORIDE 0.9 % IV SOLN
INTRAVENOUS | Status: DC
  Administered 2019-03-24: 08:00:00 1000 mL via INTRAVENOUS

## 2019-03-24 MED ORDER — PROPOFOL 500 MG/50ML IV EMUL
INTRAVENOUS | Status: DC | PRN
  Administered 2019-03-24: 100 ug/kg/min via INTRAVENOUS

## 2019-03-24 MED ORDER — PROPOFOL 10 MG/ML IV BOLUS
INTRAVENOUS | Status: AC
  Filled 2019-03-24: qty 40

## 2019-03-24 MED ORDER — GLYCOPYRROLATE 0.2 MG/ML IJ SOLN
INTRAMUSCULAR | Status: DC | PRN
  Administered 2019-03-24: 0.2 mg via INTRAVENOUS

## 2019-03-24 MED ORDER — PROPOFOL 10 MG/ML IV BOLUS
INTRAVENOUS | Status: AC
  Filled 2019-03-24: qty 20

## 2019-03-24 MED ORDER — PHENYLEPHRINE 40 MCG/ML (10ML) SYRINGE FOR IV PUSH (FOR BLOOD PRESSURE SUPPORT)
PREFILLED_SYRINGE | INTRAVENOUS | Status: DC | PRN
  Administered 2019-03-24: 40 ug via INTRAVENOUS

## 2019-03-24 MED ORDER — KETAMINE HCL 10 MG/ML IJ SOLN
INTRAMUSCULAR | Status: DC | PRN
  Administered 2019-03-24: 20 mg via INTRAVENOUS

## 2019-03-24 SURGICAL SUPPLY — 21 items

## 2019-03-24 NOTE — Anesthesia Postprocedure Evaluation (Signed)
Anesthesia Post Note  Patient: Darren Mueller  Procedure(s) Performed: COLONOSCOPY WITH PROPOFOL (N/A )     Patient location during evaluation: PACU Anesthesia Type: MAC Level of consciousness: awake and alert Pain management: pain level controlled Vital Signs Assessment: post-procedure vital signs reviewed and stable Respiratory status: spontaneous breathing, nonlabored ventilation and respiratory function stable Cardiovascular status: blood pressure returned to baseline and stable Postop Assessment: no apparent nausea or vomiting Anesthetic complications: no    Last Vitals:  Vitals:   03/24/19 0955 03/24/19 1000  BP:  130/72  Pulse:  88  Resp:  18  Temp:    SpO2: 99% 100%    Last Pain:  Vitals:   03/24/19 1000  TempSrc:   PainSc: 0-No pain                 Brennan Bailey

## 2019-03-24 NOTE — Op Note (Signed)
[facility] Patient Name: Darren Mueller Procedure Date: 03/24/2019 MRN: [id number] Attending MD: Rachael Fee , MD Date of Birth: [birth year] CSN: [id number] Age: 68 Admit Type: Outpatient Procedure:                Colonoscopy Indications:              High risk colon cancer surveillance: Personal                            history of colonic polyps; history of adenomatous                            colon polyps : Colonoscopy 12/10: "Two sessile                            polyps were found in hepatic flexure (8-52mm),                            piecemeal resected and then tatoo'd with [place] Ink"                            path showed "moderate to severe dysplasia" and he                            was recommended to have repeat examination at 3                            month interval. Colonoscopy December 2012 found 4                            polyps, one was piecemeal removed, adenoma. He was                            recommended to have repeat at 6 month recall. With                            pulm HTN, active, dyspnea this was changed to                            04/2014. Colonsocopy 12/2013 Loriene [place]" one small polyp,                            was not adenomatous                           He took a 7 day course of Mulpleta prior to this                            examination to attempt to raise his platelets,                            unsuccessfully (Plts 38 this morning) Providers:                Melton Alar.  Christella Hartigan, MD, Dwain Sarna, RN, Matthew Folks, Technician, Beryle Beams, Technician Referring MD:              Medicines:                Monitored Anesthesia Care Complications:            No immediate complications. Estimated Blood Loss:     Estimated blood loss: none. Procedure:                Pre-Anesthesia Assessment:                           - Prior to the procedure, a History and Physical                            was  performed, and patient medications and                            allergies were reviewed. The patient's tolerance of                            previous anesthesia was also reviewed. The risks                            and benefits of the procedure and the sedation                            options and risks were discussed with the patient.                            All questions were answered, and informed consent                            was obtained. Prior Anticoagulants: The patient has                            taken no previous anticoagulant or antiplatelet                            agents. ASA Grade Assessment: IV - A patient with                            severe systemic disease that is a constant threat                            to life. After reviewing the risks and benefits,                            the patient was deemed in satisfactory condition to                            undergo the procedure.  After obtaining informed consent, the colonoscope                            was passed under direct vision. Throughout the                            procedure, the patient's blood pressure, pulse, and                            oxygen saturations were monitored continuously. The                            CF-HQ190L ([id number]) Olympus colonoscope was                            introduced through the anus and advanced to the the                            cecum, identified by appendiceal orifice and                            ileocecal valve. The colonoscopy was performed                            without difficulty. The patient tolerated the                            procedure well. The quality of the bowel                            preparation was good. The ileocecal valve,                            appendiceal orifice, and rectum were photographed. Scope In: 9:20:48 AM Scope Out: 9:34:47 AM Scope Withdrawal Time: 0 hours 2 minutes 53  seconds  Total Procedure Duration: 0 hours 13 minutes 59 seconds  Findings:      Three sessile polyps were found in the cecum. Two were sessile, 4-88mm       across. One of those bled easily with minor scope contact. The third       polyp was larger (1.2cm) and pedunculated.      Prominant rectal vessels.      The exam was otherwise without abnormality on direct and retroflexion       views. Impression:               - Three sessile polyps were found in the cecum. Two                            were sessile, 4-68mm across. One of those bled                            easily with minor scope contact. The third polyp  was larger (1.2cm) and pedunculated. I did not                            attempt polypectomy given the easy contact bleeding                            and because his platelets unfortunately failed to                            improve after a 7 day course of Mulple[place].                           - Prominant rectal vessels (likely rectal varices)                           - The examination was otherwise normal on direct                            and retroflexion views. Moderate Sedation:      Not Applicable - Patient had care per Anesthesia. Recommendation:           - Patient has a contact number available for                            emergencies. The signs and symptoms of potential                            delayed complications were discussed with the                            patient. Return to normal activities tomorrow.                            Written discharge instructions were provided to the                            patient.                           - Resume previous diet.                           - Continue present medications.                           - My office will arrange repeat colonoscopy in the                            near future. Will coordinate platelet and possibly                            FFP transfusion early  that morning or the day prior. Procedure Code(s):        --- Professional ---  16109, Colonoscopy, flexible; diagnostic, including                            collection of specimen(s) by brushing or washing,                            when performed (separate procedure) Diagnosis Code(s):        --- Professional ---                           Z86.010, Personal history of colonic polyps                           K63.5, Polyp of colon CPT copyright 2019 American Medical Association. All rights reserved. The codes documented in this report are preliminary and upon coder review may  be revised to meet current compliance requirements. Rachael Fee, MD 03/24/2019 9:44:07 AM This report has been signed electronically. Number of Addenda: 0

## 2019-03-24 NOTE — Telephone Encounter (Signed)
The pt was added to the list and a reminder to call pt when able to set up .

## 2019-03-24 NOTE — Discharge Instructions (Signed)

## 2019-03-24 NOTE — Telephone Encounter (Signed)
-----  Message from Milus Banister, MD sent at 03/24/2019  9:46 AM EDT ----- THis will take some coordinating, thank you ahead of time.   He needs repeat colonoscopy as soon as hospitals are allowing to schedule 'elective cases' again.  I suspect it will be on a Thursday and so on the Monday just prior he needs cbc, cmet, inr checked.  He will need platelet transfusion (and possibly FFP as well) very early the morning of his colonoscopy. I will decide exactly what and how much he will need tranfsused after seeing his Monday labs.   THANKS again.Marland Kitchen

## 2019-03-24 NOTE — Transfer of Care (Signed)
Immediate Anesthesia Transfer of Care Note  Patient: Darren Mueller  Procedure(s) Performed: COLONOSCOPY WITH PROPOFOL (N/A )  Patient Location: PACU and Endoscopy Unit  Anesthesia Type:MAC  Level of Consciousness: awake, alert  and oriented  Airway & Oxygen Therapy: Patient Spontanous Breathing and Patient connected to face mask oxygen  Post-op Assessment: Report given to RN and Post -op Vital signs reviewed and stable  Post vital signs: Reviewed and stable  Last Vitals:  Vitals Value Taken Time  BP 112/67 03/24/2019  9:45 AM  Temp    Pulse 83 03/24/2019  9:47 AM  Resp 22 03/24/2019  9:47 AM  SpO2 100 % 03/24/2019  9:47 AM  Vitals shown include unvalidated device data.  Last Pain:  Vitals:   03/24/19 0818  TempSrc: Oral  PainSc: 0-No pain         Complications: No apparent anesthesia complications

## 2019-03-24 NOTE — Anesthesia Procedure Notes (Signed)
Procedure Name: MAC Date/Time: 03/24/2019 9:17 AM Performed by: Eben Burow, CRNA Pre-anesthesia Checklist: Patient identified, Emergency Drugs available, Suction available, Patient being monitored and Timeout performed Oxygen Delivery Method: Simple face mask Dental Injury: Teeth and Oropharynx as per pre-operative assessment

## 2019-03-24 NOTE — Interval H&P Note (Signed)
History and Physical Interval Note:  03/24/2019 9:15 AM  Darren Mueller  has presented today for surgery, with the diagnosis of rectal bleeding polyps.  The various methods of treatment have been discussed with the patient and family. After consideration of risks, benefits and other options for treatment, the patient has consented to  Procedure(s) with comments: COLONOSCOPY WITH PROPOFOL (N/A) - Draw CBC in preop as a surgical intervention.  The patient's history has been reviewed, patient examined, no change in status, stable for surgery.  I have reviewed the patient's chart and labs.  Questions were answered to the patient's satisfaction.     Milus Banister

## 2019-03-25 ENCOUNTER — Encounter (HOSPITAL_COMMUNITY): Payer: Self-pay | Admitting: Gastroenterology

## 2019-03-30 ENCOUNTER — Encounter (HOSPITAL_COMMUNITY): Payer: Self-pay

## 2019-03-30 ENCOUNTER — Encounter: Payer: Self-pay | Admitting: Family Medicine

## 2019-03-31 ENCOUNTER — Other Ambulatory Visit: Payer: Self-pay

## 2019-03-31 ENCOUNTER — Ambulatory Visit (INDEPENDENT_AMBULATORY_CARE_PROVIDER_SITE_OTHER): Payer: Medicare Other | Admitting: Family Medicine

## 2019-03-31 DIAGNOSIS — G40909 Epilepsy, unspecified, not intractable, without status epilepticus: Secondary | ICD-10-CM

## 2019-03-31 DIAGNOSIS — N183 Chronic kidney disease, stage 3 unspecified: Secondary | ICD-10-CM

## 2019-03-31 DIAGNOSIS — K7030 Alcoholic cirrhosis of liver without ascites: Secondary | ICD-10-CM | POA: Diagnosis not present

## 2019-03-31 DIAGNOSIS — M79661 Pain in right lower leg: Secondary | ICD-10-CM

## 2019-03-31 DIAGNOSIS — I10 Essential (primary) hypertension: Secondary | ICD-10-CM | POA: Diagnosis not present

## 2019-03-31 DIAGNOSIS — R609 Edema, unspecified: Secondary | ICD-10-CM

## 2019-03-31 DIAGNOSIS — M79662 Pain in left lower leg: Secondary | ICD-10-CM | POA: Diagnosis not present

## 2019-03-31 DIAGNOSIS — E039 Hypothyroidism, unspecified: Secondary | ICD-10-CM | POA: Diagnosis not present

## 2019-03-31 MED ORDER — FERROUS FUMARATE 324 (106 FE) MG PO TABS: 1.0000 | ORAL_TABLET | Freq: Every day | ORAL | 5 refills | Status: DC

## 2019-03-31 MED ORDER — THIAMINE HCL 100 MG PO TABS: 100.0000 mg | ORAL_TABLET | Freq: Every day | ORAL | 5 refills | Status: DC

## 2019-03-31 MED ORDER — FOLIC ACID 1 MG PO TABS: 1.0000 mg | ORAL_TABLET | Freq: Every day | ORAL | 5 refills | Status: DC

## 2019-03-31 MED FILL — VITAMIN B-1 100 MG TABLET: 100 | 100 days supply | Qty: 100 | Fill #0

## 2019-03-31 MED FILL — FERROCITE TABLET: 324 | 30 days supply | Qty: 30 | Fill #0

## 2019-03-31 MED FILL — FOLIC ACID 1 MG TABS: 1 | 30 days supply | Qty: 30 | Fill #0

## 2019-03-31 NOTE — Telephone Encounter (Signed)
Called patients son left message for son to call the office back or take his dad to the ER

## 2019-04-01 ENCOUNTER — Ambulatory Visit (HOSPITAL_BASED_OUTPATIENT_CLINIC_OR_DEPARTMENT_OTHER)
Admission: RE | Admit: 2019-04-01 | Discharge: 2019-04-01 | Disposition: A | Discharge status: Home / Self Care | Payer: Medicare Other | Source: Ambulatory Visit | Attending: Family Medicine | Admitting: Family Medicine

## 2019-04-01 ENCOUNTER — Other Ambulatory Visit: Payer: Self-pay

## 2019-04-01 DIAGNOSIS — M79661 Pain in right lower leg: Secondary | ICD-10-CM | POA: Diagnosis not present

## 2019-04-01 DIAGNOSIS — R609 Edema, unspecified: Secondary | ICD-10-CM | POA: Insufficient documentation

## 2019-04-01 DIAGNOSIS — R600 Localized edema: Secondary | ICD-10-CM | POA: Diagnosis not present

## 2019-04-03 DIAGNOSIS — M79669 Pain in unspecified lower leg: Secondary | ICD-10-CM | POA: Insufficient documentation

## 2019-04-03 NOTE — Assessment & Plan Note (Signed)
With increased edema. Undergo ultrasound to r/o DVT on worse side. Minimize sodium, elevate feet, stay as active as able.

## 2019-04-03 NOTE — Assessment & Plan Note (Signed)
Had an episode of unresponsiveness with his son earlier in the week but was not post ictal, no incontinence, no shaking and no post ictal state. He believes it is because he took some expired sleeping pills that night. He has not taken them again and symptoms have not returned. He denies any change in his alcohol intake prior to this episode. He agrees not to take them again, report any recurrent symptoms

## 2019-04-03 NOTE — Assessment & Plan Note (Signed)
Encouraged complete alcohol use again but reminded it is important to cut down slowly not to quit cold [place], can call us for medical support if he is ready to move forward

## 2019-04-03 NOTE — Assessment & Plan Note (Signed)
Encouraged to take his vitals weekly and record. Report any concerning symptoms, no changes to meds. Encouraged heart healthy diet such as the DASH diet and exercise as tolerated.

## 2019-04-03 NOTE — Progress Notes (Signed)
Virtual Visit via telephone Note  I connected with Darren Mueller on 03/31/19 at  5:00 PM EDT by a phone enabled telemedicine application and verified that I am speaking with the correct person using two identifiers.  Location: Patient: home Provider: home   I discussed the limitations of evaluation and management by telemedicine and the availability of in person appointments. The patient expressed understanding and agreed to proceed.    Subjective:    Patient ID: Darren Mueller, male    DOB: 1950-12-13, 68 y.o.   MRN: [id number]  No chief complaint on file.   HPI Patient is in today for evaluation of an episode of altered level of consciousness. He continues to drink alcohol but no changes in level of intake. No recent febrile illness or hospitalizations. Is strugglign with increased edema worse on right side. No falls or trauma. Denies CP/palp/SOB/HA/congestion/fevers/GI or GU c/o. Taking meds as prescribed. Had an episode of unresponsiveness with his son earlier in the week but was not post ictal, no incontinence, no shaking and no post ictal state. He believes it is because he took some expired sleeping pills that night. He has not taken them again and symptoms have not returned. He denies any change in his alcohol intake prior to this episode.  Past Medical History:  Diagnosis Date  . Alcohol dependence ([place]) 03/22/2011   In remission   . Alcohol dependence in remission ([place]) 03/22/2011   In remission   . Alcoholic cirrhosis of liver with ascites ([place]) 2014  . Anginal pain ([place])   . CHF (congestive heart failure) ([place])   . COPD (chronic obstructive pulmonary disease) ([place])   . Coronary artery disease   . Dermatitis 06/10/2015  . Diarrhea 09/04/2013  . Dyspnea   . Elbow pain, left 03/17/2017  . GERD (gastroesophageal reflux disease)   . Gout   . Headache   . Heart murmur   . Hx of colonic polyps   . Hypertension   . Hypertriglyceridemia 03/17/2017  . Hypopotassemia   .  Otalgia 11/28/2013  . Other chronic pulmonary heart diseases   . Preventative health care 06/29/2016  . Red eye 03/03/2016   right  . Renal insufficiency 07/18/2013  . Seizures ([place])   . Sleep apnea    does not wear CPAP  . Thrombocytopenia ([place]) 06/29/2016  . Unspecified hypothyroidism 01/25/2013  . Unspecified pleural effusion     Past Surgical History:  Procedure Laterality Date  . CARDIAC CATHETERIZATION N/A 12/21/2015   Procedure: Right Heart Cath;  Surgeon: Jolaine Artist, MD;  Location: [facility];  Service: Cardiovascular;  Laterality: N/A;  . CARDIAC CATHETERIZATION N/A 04/15/2016   Procedure: Right/Left Heart Cath and Coronary Angiography;  Surgeon: Jolaine Artist, MD;  Location: [facility];  Service: Cardiovascular;  Laterality: N/A;  . COLONOSCOPY N/A 12/08/2013   Procedure: COLONOSCOPY;  Surgeon: Milus Banister, MD;  Location: [facility];  Service: Endoscopy;  Laterality: N/A;  . COLONOSCOPY WITH PROPOFOL N/A 03/24/2019   Procedure: COLONOSCOPY WITH PROPOFOL;  Surgeon: Milus Banister, MD;  Location: [facility];  Service: Endoscopy;  Laterality: N/A;  Draw CBC in preop  . ESOPHAGOGASTRODUODENOSCOPY (EGD) WITH PROPOFOL N/A 03/27/2016   Procedure: ESOPHAGOGASTRODUODENOSCOPY (EGD) WITH PROPOFOL;  Surgeon: Milus Banister, MD;  Location: [facility];  Service: Endoscopy;  Laterality: N/A;  . ESOPHAGOGASTRODUODENOSCOPY (EGD) WITH PROPOFOL N/A 02/14/2019   Procedure: ESOPHAGOGASTRODUODENOSCOPY (EGD) WITH PROPOFOL;  Surgeon: Jerene Bears, MD;  Location: [facility];  Service: Gastroenterology;  Laterality: N/A;  . EYE SURGERY Bilateral 03/15/2019   lasix  . LOOP RECORDER INSERTION N/A 01/01/2017   Procedure: Loop Recorder Insertion;  Surgeon: Thompson Grayer, MD;  Location: [facility];  Service: Cardiovascular;  Laterality: N/A;  . RIGHT HEART CATHETERIZATION Right 06/06/2014   Procedure: RIGHT HEART CATH;  Surgeon: Jolaine Artist, MD;  Location: [facility] CATH  LAB;  Service: Cardiovascular;  Laterality: Right;  . UVULOPALATOPHARYNGOPLASTY  1999    Family History  Problem Relation Age of Onset  . Heart disease Mother   . Heart attack Mother   . Hypertension Mother   . Kidney failure Mother   . Liver disease Mother        stage 4 liver disease  . Prostate cancer Father   . Cancer Father        lung  . Hypertension Brother   . Gout Brother   . Alcoholism Maternal Grandfather   . Liver disease Maternal Grandfather   . Migraines Sister   . Hypertension Brother   . Colon cancer Neg Hx     Social History   Socioeconomic History  . Marital status: Divorced    Spouse name: Not on file  . Number of children: 3  . Years of education: Not on file  . Highest education level: Not on file  Occupational History  . Occupation: Retired    Comment: Microbiologist  . Financial resource strain: Not hard at all  . Food insecurity:    Worry: Never true    Inability: Never true  . Transportation needs:    Medical: No    Non-medical: No  Tobacco Use  . Smoking status: Former Smoker    Packs/day: 2.00    Years: 5.00    Pack years: 10.00    Types: Cigarettes    Last attempt to quit: 09/22/1979    Years since quitting: 39.5  . Smokeless tobacco: Never Used  Substance and Sexual Activity  . Alcohol use: Yes    Alcohol/week: 6.0 standard drinks    Types: 6 Shots of liquor per week    Comment: social drinker  . Drug use: No  . Sexual activity: Yes    Birth control/protection: Spermicide  Lifestyle  . Physical activity:    Days per week: 0 days    Minutes per session: 0 min  . Stress: Rather much  Relationships  . Social connections:    Talks on phone: More than three times a week    Gets together: Three times a week    Attends religious service: More than 4 times per year    Active member of club or organization: Yes    Attends meetings of clubs or organizations: More than 4 times per year    Relationship status: Divorced   . Intimate partner violence:    Fear of current or ex partner: Not on file    Emotionally abused: Not on file    Physically abused: Not on file    Forced sexual activity: Not on file  Other Topics Concern  . Not on file  Social History Narrative   0 caffeine drinks daily     Outpatient Medications Prior to Visit  Medication Sig Dispense Refill  . acetaminophen (TYLENOL) 500 MG tablet Take 1,000 mg by mouth every 6 (six) hours as needed (for pain.).    Marland Kitchen Albuterol Sulfate (PROAIR RESPICLICK) 694 (90 Base) MCG/ACT AEPB Inhale 2 puffs into the lungs every  6 (six) hours as needed (SOB or wheezing). (Patient taking differently: Inhale 2 puffs into the lungs See admin instructions. Inhale 2 puffs scheduled twice daily & every 6 hours as needed wheezing/shortness of breath.) 3 each 1  . allopurinol (ZYLOPRIM) 100 MG tablet Take 2 tablets (200 mg total) by mouth daily. (Patient taking differently: Take 100 mg by mouth 2 (two) times daily. ) 180 tablet 1  . azelastine (ASTELIN) 0.1 % nasal spray Place 2 sprays into both nostrils 2 (two) times daily. Use in each nostril as directed (Patient taking differently: Place 2 sprays into both nostrils daily as needed for rhinitis or allergies. ) 30 mL 1  . Blood Pressure Monitoring (BLOOD PRESSURE CUFF) MISC Use as directed. Check blood pressure bid prn Dx:I10 1 each 0  . cholecalciferol (VITAMIN D) 25 MCG (1000 UT) tablet Take 1,000 Units by mouth daily.    . diphenoxylate-atropine (LOMOTIL) 2.5-0.025 MG tablet Take 1 tablet by mouth 3 (three) times daily. (Patient taking differently: Take 1 tablet by mouth See admin instructions. Take 1 tablet by mouth scheduled twice daily & may take an additional dose if needed for diarrhea/loose stools) 60 tablet 0  . fluticasone-salmeterol (ADVAIR HFA) 230-21 MCG/ACT inhaler Inhale 2 puffs into the lungs every morning. 48 g 1  . furosemide (LASIX) 40 MG tablet Take 1 tablet (40 mg total) by mouth 2 (two) times daily. 60  tablet 3  . gatifloxacin (ZYMAXID) 0.5 % SOLN Place 1 drop into the left eye daily.     . hyoscyamine (LEVSIN SL) 0.125 MG SL tablet Place 1 tablet (0.125 mg total) under the tongue every 6 (six) hours as needed. (Patient taking differently: Place 0.125 mg under the tongue every 6 (six) hours as needed for cramping (stomach pains.). ) 90 tablet 3  . macitentan (OPSUMIT) 10 MG tablet Take 1 tablet (10 mg total) by mouth daily. 30 tablet 11  . pantoprazole (PROTONIX) 40 MG tablet Take 1 tablet (40 mg total) by mouth daily before breakfast. 30 tablet 0  . potassium chloride (K-DUR) 10 MEQ tablet Take 2 tablets (20 mEq total) by mouth daily. 60 tablet 3  . rosuvastatin (CRESTOR) 5 MG tablet Take 1 tablet (5 mg total) by mouth daily. 90 tablet 3  . Selexipag (UPTRAVI) 1000 MCG TABS Take 1,000 mcg by mouth 2 (two) times daily. 60 tablet 11  . spironolactone (ALDACTONE) 50 MG tablet TAKE 1 TABLET DAILY (Patient taking differently: Take 50 mg by mouth daily. ) 90 tablet 1  . tadalafil, PAH, (ADCIRCA) 20 MG tablet TAKE 2 TABLETS (40 MG TOTAL) DAILY (Patient taking differently: Take 40 mg by mouth daily. ) 60 tablet 12  . tiZANidine (ZANAFLEX) 2 MG tablet Take 0.5-2 tablets (1-4 mg total) by mouth every 6 (six) hours as needed for muscle spasms. 30 tablet 0  . folic acid (FOLVITE) 1 MG tablet Take 1 tablet (1 mg total) by mouth daily. 30 tablet 0  . thiamine 100 MG tablet Take 1 tablet (100 mg total) by mouth daily. 30 tablet 0   No facility-administered medications prior to visit.     Allergies  Allergen Reactions  . Aspirin Other (See Comments)    hypertension  . Nitroglycerin     Headache    Review of Systems  Constitutional: Positive for malaise/fatigue. Negative for fever.  HENT: Negative for congestion.   Eyes: Negative for blurred vision.  Respiratory: Positive for shortness of breath.   Cardiovascular: Positive for leg swelling. Negative for  chest pain and palpitations.   Gastrointestinal: Negative for abdominal pain, blood in stool and nausea.  Genitourinary: Negative for dysuria and frequency.  Musculoskeletal: Negative for falls.  Skin: Negative for rash.  Neurological: Positive for loss of consciousness. Negative for dizziness and headaches.  Endo/Heme/Allergies: Negative for environmental allergies.  Psychiatric/Behavioral: Negative for depression. The patient is not nervous/anxious.        Objective:    Physical Exam unable to obtain  There were no vitals taken for this visit. Wt Readings from Last 3 Encounters:  03/21/19 216 lb 9.6 oz (98.2 kg)  03/08/19 204 lb (92.5 kg)  02/14/19 196 lb (88.9 kg)    Diabetic Foot Exam - Simple   No data filed     Lab Results  Component Value Date   WBC 2.2 (L) 03/24/2019   HGB 9.0 (L) 03/24/2019   HCT 29.4 (L) 03/24/2019   PLT 38 (L) 03/24/2019   GLUCOSE 108 (H) 03/18/2019   CHOL 109 08/09/2018   TRIG 114.0 08/09/2018   HDL 48.20 08/09/2018   LDLDIRECT 34.0 05/04/2018   LDLCALC 38 08/09/2018   ALT 29 02/22/2019   AST 93 (H) 02/22/2019   NA 138 03/18/2019   K 3.6 03/18/2019   CL 108 03/18/2019   CREATININE 2.25 (H) 03/18/2019   BUN 33 (H) 03/18/2019   CO2 22 03/18/2019   TSH 2.39 08/09/2018   PSA 0.49 11/01/2012   INR 1.4 (H) 02/12/2019   HGBA1C 4.8 08/09/2018    Lab Results  Component Value Date   TSH 2.39 08/09/2018   Lab Results  Component Value Date   WBC 2.2 (L) 03/24/2019   HGB 9.0 (L) 03/24/2019   HCT 29.4 (L) 03/24/2019   MCV 104.3 (H) 03/24/2019   PLT 38 (L) 03/24/2019   Lab Results  Component Value Date   NA 138 03/18/2019   K 3.6 03/18/2019   CO2 22 03/18/2019   GLUCOSE 108 (H) 03/18/2019   BUN 33 (H) 03/18/2019   CREATININE 2.25 (H) 03/18/2019   BILITOT 1.2 02/22/2019   ALKPHOS 287 (H) 02/22/2019   AST 93 (H) 02/22/2019   ALT 29 02/22/2019   PROT 6.7 02/22/2019   ALBUMIN 3.5 02/22/2019   CALCIUM 8.5 (L) 03/18/2019   ANIONGAP 8 03/18/2019   GFR 44.35 (L)  02/22/2019   Lab Results  Component Value Date   CHOL 109 08/09/2018   Lab Results  Component Value Date   HDL 48.20 08/09/2018   Lab Results  Component Value Date   LDLCALC 38 08/09/2018   Lab Results  Component Value Date   TRIG 114.0 08/09/2018   Lab Results  Component Value Date   CHOLHDL 2 08/09/2018   Lab Results  Component Value Date   HGBA1C 4.8 08/09/2018       Assessment & Plan:   Problem List Items Addressed This Visit    Hypertension (Chronic)    Encouraged to take his vitals weekly and record. Report any concerning symptoms, no changes to meds. Encouraged heart healthy diet such as the DASH diet and exercise as tolerated.       Chronic renal insufficiency, stage III (moderate) (HCC) (Chronic)    Hydrate and monitor      Cirrhosis with alcoholism (HCC) (Chronic)    Encouraged complete alcohol use again but reminded it is important to cut down slowly not to quit cold [place], can call us for medical support if he is ready to move forward      Hypothyroidism  Seizure disorder [facility])    Had an episode of unresponsiveness with his son earlier in the week but was not post ictal, no incontinence, no shaking and no post ictal state. He believes it is because he took some expired sleeping pills that night. He has not taken them again and symptoms have not returned. He denies any change in his alcohol intake prior to this episode. He agrees not to take them again, report any recurrent symptoms      Calf pain    With increased edema. Undergo ultrasound to r/o DVT on worse side. Minimize sodium, elevate feet, stay as active as able.      Relevant Orders   US Venous Img Lower Unilateral Right (Completed)    Other Visit Diagnoses    Edema, unspecified type    -  Primary   Relevant Orders   US Venous Img Lower Unilateral Right (Completed)      I am having Darren Mueller "Col" start on Ferrous Fumarate. I am also having him maintain his spironolactone,  allopurinol, tiZANidine, tadalafil (PAH), diphenoxylate-atropine, Albuterol Sulfate, fluticasone-salmeterol, hyoscyamine, Selexipag, rosuvastatin, potassium chloride, azelastine, macitentan, gatifloxacin, pantoprazole, Blood Pressure Cuff, furosemide, cholecalciferol, acetaminophen, thiamine, and folic acid.  Meds ordered this encounter  Medications  . thiamine 100 MG tablet    Sig: Take 1 tablet (100 mg total) by mouth daily.    Dispense:  30 tablet    Refill:  5  . folic acid (FOLVITE) 1 MG tablet    Sig: Take 1 tablet (1 mg total) by mouth daily.    Dispense:  30 tablet    Refill:  5  . Ferrous Fumarate (HEMOCYTE) 324 (106 Fe) MG TABS tablet    Sig: Take 1 tablet (106 mg of iron total) by mouth daily.    Dispense:  30 tablet    Refill:  5    I discussed the assessment and treatment plan with the patient. The patient was provided an opportunity to ask questions and all were answered. The patient agreed with the plan and demonstrated an understanding of the instructions.   The patient was advised to call back or seek an in-person evaluation if the symptoms worsen or if the condition fails to improve as anticipated.  I provided 25 minutes of non-face-to-face time during this encounter.   Penni Homans, MD

## 2019-04-03 NOTE — Assessment & Plan Note (Deleted)
On Levothyroxine, continue to monitor 

## 2019-04-03 NOTE — Assessment & Plan Note (Signed)
Hydrate and monitor 

## 2019-04-06 ENCOUNTER — Telehealth: Payer: Self-pay | Admitting: Family Medicine

## 2019-04-06 ENCOUNTER — Telehealth: Payer: Self-pay | Admitting: *Deleted

## 2019-04-06 DIAGNOSIS — R739 Hyperglycemia, unspecified: Secondary | ICD-10-CM

## 2019-04-06 DIAGNOSIS — I10 Essential (primary) hypertension: Secondary | ICD-10-CM

## 2019-04-06 DIAGNOSIS — E039 Hypothyroidism, unspecified: Secondary | ICD-10-CM

## 2019-04-06 DIAGNOSIS — E785 Hyperlipidemia, unspecified: Secondary | ICD-10-CM

## 2019-04-06 NOTE — Telephone Encounter (Signed)
Spoked with pt and pt stated needing lab appt but pt does not have lab orders on file, pt stated had a VOV with provider on 03-31-2019 and per pt provider stated will put lab orders on file. Please verify and put orders for pt if need. Please advise.

## 2019-04-06 NOTE — Telephone Encounter (Signed)
Lab orders placed please schedule lab appt

## 2019-04-06 NOTE — Telephone Encounter (Signed)
Copied from [place] [id number]. Topic: General - Other >> Apr 05, 2019  2:28 PM Carolyn Stare wrote:  Pt cal lto say she would like the results of his [place] that he had on 04/01/2019 and is asking if he is suppose to have blood work done, pt said right leg is still swollen

## 2019-04-07 NOTE — Telephone Encounter (Signed)
Schedule pt on 04-13-2019 for labs. Done

## 2019-04-07 NOTE — Telephone Encounter (Signed)
He needs a cmp to make sure his renal function is ok and a magnesium then we can add Zaroxolyn 2 mg 20 minutes prior to his am Lasix dose and do that for a week and see if that helps. Disp #20

## 2019-04-07 NOTE — Telephone Encounter (Signed)
Spoke with patient he stated that the left leg is now starting to swell. He is taking lasix 39m bid. Wants to know should he increase, get another [place] The first [place] for the Right leg.  Please advise

## 2019-04-08 MED ORDER — METOLAZONE 2.5 MG PO TABS: 2.5000 mg | ORAL_TABLET | Freq: Every day | ORAL | 0 refills | Status: DC

## 2019-04-08 MED FILL — metOLazone 5 MG TABS: 5 | 20 days supply | Qty: 10 | Fill #0

## 2019-04-08 NOTE — Telephone Encounter (Signed)
Patient notified and labs ordered

## 2019-04-13 ENCOUNTER — Telehealth: Payer: Self-pay | Admitting: Family Medicine

## 2019-04-13 ENCOUNTER — Other Ambulatory Visit: Payer: Self-pay

## 2019-04-13 ENCOUNTER — Other Ambulatory Visit (INDEPENDENT_AMBULATORY_CARE_PROVIDER_SITE_OTHER): Payer: Medicare Other

## 2019-04-13 DIAGNOSIS — R739 Hyperglycemia, unspecified: Secondary | ICD-10-CM | POA: Diagnosis not present

## 2019-04-13 DIAGNOSIS — I10 Essential (primary) hypertension: Secondary | ICD-10-CM

## 2019-04-13 DIAGNOSIS — E039 Hypothyroidism, unspecified: Secondary | ICD-10-CM | POA: Diagnosis not present

## 2019-04-13 DIAGNOSIS — E785 Hyperlipidemia, unspecified: Secondary | ICD-10-CM

## 2019-04-13 LAB — COMPREHENSIVE METABOLIC PANEL
ALT: 15 U/L (ref 0–53)
AST: 49 U/L — ABNORMAL HIGH (ref 0–37)
Albumin: 3.4 g/dL — ABNORMAL LOW (ref 3.5–5.2)
Alkaline Phosphatase: 395 U/L — ABNORMAL HIGH (ref 39–117)
BUN: 35 mg/dL — ABNORMAL HIGH (ref 6–23)
CO2: 25 mEq/L (ref 19–32)
Calcium: 8.4 mg/dL (ref 8.4–10.5)
Chloride: 104 mEq/L (ref 96–112)
Creatinine, Ser: 1.98 mg/dL — ABNORMAL HIGH (ref 0.40–1.50)
GFR: 40.73 mL/min — ABNORMAL LOW (ref >60.00)
Glucose, Bld: 87 mg/dL (ref 70–99)
Potassium: 3.2 mEq/L — ABNORMAL LOW (ref 3.5–5.1)
Sodium: 138 mEq/L (ref 135–145)
Total Bilirubin: 1.6 mg/dL — ABNORMAL HIGH (ref 0.2–1.2)
Total Protein: 6.9 g/dL (ref 6.0–8.3)

## 2019-04-13 LAB — CBC WITH DIFFERENTIAL/PLATELET
Basophils Absolute: 0 10*3/uL (ref 0.0–0.1)
Basophils Relative: 0.2 % (ref 0.0–3.0)
Eosinophils Absolute: 0 10*3/uL (ref 0.0–0.7)
Eosinophils Relative: 2.3 % (ref 0.0–5.0)
HCT: 31.4 % — ABNORMAL LOW (ref 39.0–52.0)
Hemoglobin: 10.2 g/dL — ABNORMAL LOW (ref 13.0–17.0)
Lymphocytes Relative: 29.3 % (ref 12.0–46.0)
Lymphs Abs: 0.6 10*3/uL — ABNORMAL LOW (ref 0.7–4.0)
MCHC: 32.4 g/dL (ref 30.0–36.0)
MCV: 99.4 fl (ref 78.0–100.0)
Monocytes Absolute: 0.3 10*3/uL (ref 0.1–1.0)
Monocytes Relative: 17.4 % — ABNORMAL HIGH (ref 3.0–12.0)
Neutro Abs: 1 10*3/uL — ABNORMAL LOW (ref 1.4–7.7)
Neutrophils Relative %: 50.8 % (ref 43.0–77.0)
Platelets: 29 10*3/uL — CL (ref 150.0–400.0)
RBC: 3.16 Mil/uL — ABNORMAL LOW (ref 4.22–5.81)
RDW: 19.4 % — ABNORMAL HIGH (ref 11.5–15.5)
WBC: 2 10*3/uL — ABNORMAL LOW (ref 4.0–10.5)

## 2019-04-13 LAB — LIPID PANEL
Cholesterol: 124 mg/dL (ref 0–200)
HDL: 55.9 mg/dL (ref >39.00)
LDL Cholesterol: 51 mg/dL (ref 0–99)
NonHDL: 67.87
Total CHOL/HDL Ratio: 2
Triglycerides: 83 mg/dL (ref 0.0–149.0)
VLDL: 16.6 mg/dL (ref 0.0–40.0)

## 2019-04-13 LAB — MAGNESIUM: Magnesium: 1.7 mg/dL (ref 1.5–2.5)

## 2019-04-13 LAB — HEMOGLOBIN A1C: Hgb A1c MFr Bld: 4.8 % (ref 4.6–6.5)

## 2019-04-13 LAB — TSH: TSH: 1.96 u[IU]/mL (ref 0.35–4.50)

## 2019-04-13 NOTE — Telephone Encounter (Signed)
CRITICAL VALUE STICKER  CRITICAL VALUE: low platelet count of 29,000   RECEIVER (on-site recipient of call): Belicia Difatta, RMA  DATE & TIME NOTIFIED: 04/13/2019 @ 12:20pm  MESSENGER (representative from lab): Sherre Scarlet, Marisue Brooklyn  MD NOTIFIED: Dr. Charlett Blake  TIME OF NOTIFICATION: 12:25pm  RESPONSE:

## 2019-04-13 NOTE — Telephone Encounter (Signed)
Stable for patient and patient asymptomatic

## 2019-04-14 ENCOUNTER — Encounter (HOSPITAL_COMMUNITY): Payer: Self-pay

## 2019-04-14 ENCOUNTER — Encounter: Payer: Self-pay | Admitting: Family Medicine

## 2019-04-14 ENCOUNTER — Ambulatory Visit (HOSPITAL_COMMUNITY)
Admission: RE | Admit: 2019-04-14 | Discharge: 2019-04-14 | Disposition: A | Discharge status: Home / Self Care | Payer: Medicare Other | Source: Ambulatory Visit | Attending: Adult Health | Admitting: Adult Health

## 2019-04-14 VITALS — Wt 203.0 lb

## 2019-04-14 DIAGNOSIS — K766 Portal hypertension: Secondary | ICD-10-CM | POA: Diagnosis not present

## 2019-04-14 DIAGNOSIS — I2721 Secondary pulmonary arterial hypertension: Secondary | ICD-10-CM | POA: Diagnosis not present

## 2019-04-14 DIAGNOSIS — I5032 Chronic diastolic (congestive) heart failure: Secondary | ICD-10-CM

## 2019-04-14 DIAGNOSIS — N183 Chronic kidney disease, stage 3 unspecified: Secondary | ICD-10-CM

## 2019-04-14 DIAGNOSIS — K7031 Alcoholic cirrhosis of liver with ascites: Secondary | ICD-10-CM

## 2019-04-14 NOTE — H&P (View-Only) (Signed)
Heart Failure TeleHealth Note  Due to national recommendations of social distancing due to [place] 19, Audio/video telehealth visit is felt to be most appropriate for this patient at this time.  See MyChart message from today for patient consent regarding telehealth for [facility].  Date:  04/14/2019   ID:  Darren Mueller, DOB 04-15-1951, MRN [id number]  Location: Home  Provider location: [facility] Type of Visit: Established patient   PCP:  Mosie Lukes, MD  Cardiologist:  No primary care provider on file. Primary HF: Dr Haroldine Laws   Chief Complaint: Heart Failure    History of Present Illness: Darren Mueller is a 68 y.o. male with a history of ("Darren Mueller") is a 68yo with history of COPD, portopulmonary hypertension with RV failure, and ETOH cirrhosis.  Admitted in 6/17 with CP and SOB. Was diuresed. Troponins normal. Underwent R/L cath. Minimal CAD with moderate PAH and preserved cardiac output.   Admitted 06/2016 and 07/2016 with syncope. On September admission found to have elevated ETOH level as well as R 6th rib fracture.   Wore 30 day monitor up until 08/28/16. No AF noted. No events.   Admitted February2018after syncopal event. LINQ placed.   Admitted 7/4 through 05/08/17 with volume depletion. Diuretics held and changed to as needed for weight 200 pounds or greater. Discharge weight was 194 pounds.   Was admitted 4/20 with hematochezia and melena. His hemoglobin remained stable. No episodes of GI bleed after hospitalization. Underwent EGD by GI with finding of Grade 1 and a small esophageal varices, no evidence of recent bleeding,portal hypertensive gastropathy. Started on nadolol which made him very dizzy. He stopped this and feels a lot    PAH meds 1) Macitnentan 10 mg daily 2) Adcirca 40 mg daily  3) Selexipeg 1000/1200 daily  He presents via audio conferencing for a telehealth visit today.  Last week diuretics  increased by his PCP.  Overall feeling fair. Denies dizziness/syncope. Mild SOB with exertion. Denies PND/Orthopnea. Complaining of leg edema.  Appetite ok. No fever or chills. Weight at home 203 pounds. Says he has cut back his alcohol intake to once a week.  Taking all medications.    he denies symptoms worrisome for COVID 19.   Studies: ECHO 12/14 EF 55-60% Peak PA pressure 35. Severe RV dysfunction  ECHO 7/15 EF 60% RV moderately to severely dilated. Moderate HK RVSP 68m HG  ECHO 6/16 EF 60% RV moderately to severely dilated. Severe HK RVSP ~65 mm HG. D-shaped septum Echo 2/17 LVEF 60-65% RV massively dilated. Flat septum. Severe HK. Moderate TR RVSP ~65. IVC small. No effusion  ECHO 01/01/2017 EF 50-55% Grade I DD. RV severely dilated. Peak PA pressure 62 mm hg Echo 9/19 with EF 60-65% RV markedly dilated and hypokinetic with D-shaped septum. RVSP 676mG  Cath 6/17 Mid RCA lesion, 20% stenosed. Dist LAD lesion, 20% stenosed. Ao = 107/70 (87) LV = 106/3/11 RA = 4 RV = 74/11 PA = 74/26 (45) PCW = 6 Fick cardiac output/index = 4.6.2.2 PVR = 8.5 Ao sat = 95% PA sat = 61%, 58%  RHC 2/17 RA = 11 RV = 80/16/19 PA = 83/61 (71) PCW = 9 Fick cardiac output/index = 5.6/2.6 PVR = 10.3 WU Ao sat = 94% PA sat = 65%, 68% High SVC sat = 65%   PFTs 3/14 FEV1 2.02 L (58%) FVC 2.7L (54%) FEV1/FVC (89%) DLCO 53%  PFTs 1/17 FEV1 2.05 L (61%) FVC 2.48L (  56%) DLCO 44%   Ab u/s 8/16: + cirrhosis/mild to moderate splenomegaly  6 min walk 01/10/13, 1290 feet  6MW (01/10/14) = 1280 feet (380 m) 6MW (9/15) = 1350 feet (411 m) O2 sats ranged from 89-95% on room air, HR ranged from 100-129. 6MW (6/16) = 384 meters  6MW (2/17) = 1250 feet (329m 6MW (8/17) = 1320 feet      Past Medical History:  Diagnosis Date  . Alcohol dependence ([place] 03/22/2011   In remission   . Alcohol dependence in remission ([place] 03/22/2011   In remission   . Alcoholic cirrhosis of liver with  ascites ([facility] 2014  . Anginal pain ([place]   . CHF (congestive heart failure) ([place]   . COPD (chronic obstructive pulmonary disease) ([place]   . Coronary artery disease   . Dermatitis 06/10/2015  . Diarrhea 09/04/2013  . Dyspnea   . Elbow pain, left 03/17/2017  . GERD (gastroesophageal reflux disease)   . Gout   . Headache   . Heart murmur   . Hx of colonic polyps   . Hypertension   . Hypertriglyceridemia 03/17/2017  . Hypopotassemia   . Otalgia 11/28/2013  . Other chronic pulmonary heart diseases   . Preventative health care 06/29/2016  . Red eye 03/03/2016   right  . Renal insufficiency 07/18/2013  . Seizures ([facility]   . Sleep apnea    does not wear CPAP  . Thrombocytopenia ([place] 06/29/2016  . Unspecified hypothyroidism 01/25/2013  . Unspecified pleural effusion    Past Surgical History:  Procedure Laterality Date  . CARDIAC CATHETERIZATION N/A 12/21/2015   Procedure: Right Heart Cath;  Surgeon: DJolaine Artist MD;  [place];  Service: Cardiovascular;  Laterality: N/A;  . CARDIAC CATHETERIZATION N/A 04/15/2016   Procedure: Right/Left Heart Cath and Coronary Angiography;  Surgeon: DJolaine Artist MD;  [facility];  Service: Cardiovascular;  Laterality: N/A;  . COLONOSCOPY N/A 12/08/2013   Procedure: COLONOSCOPY;  Surgeon: DMilus Banister MD;  [facility];  Service: Endoscopy;  Laterality: N/A;  . COLONOSCOPY WITH PROPOFOL N/A 03/24/2019   Procedure: COLONOSCOPY WITH PROPOFOL;  Surgeon: JMilus Banister MD;  Location: [facility];  Service: Endoscopy;  Laterality: N/A;  Draw CBC in preop  . ESOPHAGOGASTRODUODENOSCOPY (EGD) WITH PROPOFOL N/A 03/27/2016   Procedure: ESOPHAGOGASTRODUODENOSCOPY (EGD) WITH PROPOFOL;  Surgeon: DMilus Banister MD;  [place] [facility]  Service: Endoscopy;  Laterality: N/A;  . ESOPHAGOGASTRODUODENOSCOPY (EGD) WITH PROPOFOL N/A 02/14/2019   Procedure: ESOPHAGOGASTRODUODENOSCOPY (EGD) WITH PROPOFOL;  Surgeon:  PJerene Bears MD;  [facility];  Service: Gastroenterology;  Laterality: N/A;  . EYE SURGERY Bilateral 03/15/2019   lasix  . LOOP RECORDER INSERTION N/A 01/01/2017   Procedure: Loop Recorder Insertion;  Surgeon: JThompson Grayer MD;  [place] [facility];  Service: Cardiovascular;  Laterality: N/A;  . RIGHT HEART CATHETERIZATION Right 06/06/2014   Procedure: RIGHT HEART CATH;  Surgeon: DJolaine Artist MD;  [facility];  Service: Cardiovascular;  Laterality: Right;  . UVULOPALATOPHARYNGOPLASTY  1999     Current Outpatient Medications  Medication Sig Dispense Refill  . acetaminophen (TYLENOL) 500 MG tablet Take 1,000 mg by mouth every 6 (six) hours as needed (for pain.).    .Marland KitchenAlbuterol Sulfate (PROAIR RESPICLICK) 1638(90 Base) MCG/ACT AEPB Inhale 2 puffs into Darren lungs every 6 (six) hours as needed (SOB or wheezing). (Patient taking differently: Inhale 2 puffs into Darren lungs See admin instructions. Inhale  2 puffs scheduled twice daily & every 6 hours as needed wheezing/shortness of breath.) 3 each 1  . azelastine (ASTELIN) 0.1 % nasal spray Place 2 sprays into both nostrils 2 (two) times daily. Use in each nostril as directed (Patient taking differently: Place 2 sprays into both nostrils daily as needed for rhinitis or allergies. ) 30 mL 1  . Blood Pressure Monitoring (BLOOD PRESSURE CUFF) MISC Use as directed. Check blood pressure bid prn Dx:I10 1 each 0  . cholecalciferol (VITAMIN D) 25 MCG (1000 UT) tablet Take 1,000 Units by mouth daily.    . diphenoxylate-atropine (LOMOTIL) 2.5-0.025 MG tablet Take 1 tablet by mouth 3 (three) times daily. (Patient taking differently: Take 1 tablet by mouth See admin instructions. Take 1 tablet by mouth scheduled twice daily & may take an additional dose if needed for diarrhea/loose stools) 60 tablet 0  . Ferrous Fumarate (HEMOCYTE) 324 (106 Fe) MG TABS tablet Take 1 tablet (106 mg of iron total) by mouth daily. 30 tablet 5  .  fluticasone-salmeterol (ADVAIR HFA) 230-21 MCG/ACT inhaler Inhale 2 puffs into Darren lungs every morning. 48 g 1  . folic acid (FOLVITE) 1 MG tablet Take 1 tablet (1 mg total) by mouth daily. 30 tablet 5  . furosemide (LASIX) 40 MG tablet Take 1 tablet (40 mg total) by mouth 2 (two) times daily. 60 tablet 3  . gatifloxacin (ZYMAXID) 0.5 % SOLN Place 1 drop into Darren left eye daily.     . hyoscyamine (LEVSIN SL) 0.125 MG SL tablet Place 1 tablet (0.125 mg total) under Darren tongue every 6 (six) hours as needed. (Patient taking differently: Place 0.125 mg under Darren tongue every 6 (six) hours as needed for cramping (stomach pains.). ) 90 tablet 3  . macitentan (OPSUMIT) 10 MG tablet Take 1 tablet (10 mg total) by mouth daily. 30 tablet 11  . metolazone (ZAROXOLYN) 2.5 MG tablet Take 1 tablet (2.5 mg total) by mouth daily. 20 tablet 0  . pantoprazole (PROTONIX) 40 MG tablet Take 1 tablet (40 mg total) by mouth daily before breakfast. 30 tablet 0  . potassium chloride (K-DUR) 10 MEQ tablet Take 2 tablets (20 mEq total) by mouth daily. 60 tablet 3  . rosuvastatin (CRESTOR) 5 MG tablet Take 1 tablet (5 mg total) by mouth daily. 90 tablet 3  . Selexipag (UPTRAVI) 1000 MCG TABS Take 1,000 mcg by mouth 2 (two) times daily. 60 tablet 11  . spironolactone (ALDACTONE) 50 MG tablet TAKE 1 TABLET DAILY (Patient taking differently: Take 50 mg by mouth daily. ) 90 tablet 1  . tadalafil, PAH, (ADCIRCA) 20 MG tablet TAKE 2 TABLETS (40 MG TOTAL) DAILY (Patient taking differently: Take 40 mg by mouth daily. ) 60 tablet 12  . thiamine 100 MG tablet Take 1 tablet (100 mg total) by mouth daily. 30 tablet 5  . tiZANidine (ZANAFLEX) 2 MG tablet Take 0.5-2 tablets (1-4 mg total) by mouth every 6 (six) hours as needed for muscle spasms. 30 tablet 0  . allopurinol (ZYLOPRIM) 100 MG tablet Take 2 tablets (200 mg total) by mouth daily. (Patient not taking: Reported on 04/14/2019) 180 tablet 1   No current facility-administered  medications for this encounter.     Allergies:   Aspirin and Nitroglycerin   Social History:  Darren patient  reports that he quit smoking about 39 years ago. His smoking use included cigarettes. He has a 10.00 pack-year smoking history. He has never used smokeless tobacco. He reports current alcohol use  of about 6.0 standard drinks of alcohol per week. He reports that he does not use drugs.   Family History:  Darren patient's family history includes Alcoholism in his maternal grandfather; Cancer in his father; Gout in his brother; Heart attack in his mother; Heart disease in his mother; Hypertension in his brother, brother, and mother; Kidney failure in his mother; Liver disease in his maternal grandfather and mother; Migraines in his sister; Prostate cancer in his father.   ROS:  Please see Darren history of present illness.   All other systems are personally reviewed and negative.   Exam: Tele Health Call; Exam is subjective  General:  Speaks in full sentences. No resp difficulty. Lungs: Normal respiratory effort with conversation.  Abdomen: Non-distended per patient report Extremities: He reports leg edema.  Neuro: Alert & oriented x 3.   Recent Labs: 09/27/2018: Pro B Natriuretic peptide (BNP) 415.0 02/12/2019: B Natriuretic Peptide 158.1 04/13/2019: ALT 15; BUN 35; Creatinine, Ser 1.98; Hemoglobin 10.2; Magnesium 1.7; Platelets 29.0 Repeated and verified X2.; Potassium 3.2; Sodium 138; TSH 1.96  Personally reviewed   Wt Readings from Last 3 Encounters:  04/14/19 92.1 kg (203 lb)  03/21/19 98.2 kg (216 lb 9.6 oz)  03/08/19 92.5 kg (204 lb)      ASSESSMENT AND PLAN:  1. PAH: Suspected portopulmonary HTN in setting of ETOH cirrhosis. - Stable NYHA III.6MW 9/19 1280 feet (390 meters) - Echo 9/19 with EF 60-65% RV markedly dilated and hypokinetic with D-shaped septum - Continue selexipag 1000 mcg twice a day. Intolerant higher dose due to diarrhea and headaches .  - Continue macitentan  10 daily and adcirca 40 mg daily. - With RV strain on echo may need to consider IV therapies in Darren near future. -NYHA III. Volume status elevated. Diuretics increased by PCP. I have asked him to call HF clinic on Monday if he remains edematous.   2. CKD III:  -Baseline creatinine 1.5-2.0 Labs check on 6/10 by by PCP. Creatinine 1.9  - Followed by Dr. Marval Regal   3. Cirrhosis:  - Likely combination of RV failure and ETOH - Follows with Dr. Ardis Hughs. Has appt in 2 weeks - Did not tolerate nadolol.  - Recent EGC 4/20 stable - Discussed importance of eliminating all alcohol intake.   4. Recurrent syncope/seizure - by history event sounds like a seizure but need to make sure not cardiac source. Seen in ED 1/15. ETOH level high,. CT brain ok.  - LINQ interrogations have shown no arrhythmias and thus most likely seizure - Continue Keppra. Follows with Dr. Delice Lesch  5. HTN:  6. OSA:  - Has mild OSA with AHI 12 and desats down to 82%.  - Intolerant CPAP.   7. Pancytopenia:  - Has seen Dr. Alen Blew in Hematology - felt like it is due to splenic sequestration.  -Platlelets down to 29 on 6/10. He has follow up with hematology.     COVID screen Darren patient does not have any symptoms that suggest any further testing/ screening at this time.  Social distancing reinforced today.  Patient Risk: After full review of this patients clinical status, I feel that they are at moderate risk for cardiac decompensation at this time.  Relevant cardiac medications were reviewed at length with Darren patient today. Darren patient does not have concerns regarding their medications at this time.   Darren following changes were made today:  None  Recommended follow-up:  Dr Haroldine Laws in 3 months with an ECHO   Today, I  have spent 11 minutes with Darren patient with telehealth technology discussing Darren above issues .    Jeanmarie Hubert, NP  04/14/2019 10:22 AM  [place] [address] [facility] [place] [address] [phone] (office) [phone] (fax)

## 2019-04-14 NOTE — Telephone Encounter (Signed)
Patient made aware.

## 2019-04-14 NOTE — Progress Notes (Signed)
Heart Failure TeleHealth Note  Due to national recommendations of social distancing due to [place] 19, Audio/video telehealth visit is felt to be most appropriate for this patient at this time.  See MyChart message from today for patient consent regarding telehealth for [facility].  Date:  04/14/2019   ID:  Darren Mueller, DOB 16-Dec-1950, MRN [id number]  Location: Home  Provider location: [facility] Type of Visit: Established patient   PCP:  Mosie Lukes, MD  Cardiologist:  No primary care provider on file. Primary HF: Dr Haroldine Laws   Chief Complaint: Heart Failure    History of Present Illness: Darren Mueller is a 68 y.o. male with a history of ("the Darren Mueller") is a 68yo with history of COPD, portopulmonary hypertension with RV failure, and ETOH cirrhosis.  Admitted in 6/17 with CP and SOB. Was diuresed. Troponins normal. Underwent R/L cath. Minimal CAD with moderate PAH and preserved cardiac output.   Admitted 06/2016 and 07/2016 with syncope. On September admission found to have elevated ETOH level as well as R 6th rib fracture.   Wore 30 day monitor up until 08/28/16. No AF noted. No events.   Admitted February2018after syncopal event. LINQ placed.   Admitted 7/4 through 05/08/17 with volume depletion. Diuretics held and changed to as needed for weight 200 pounds or greater. Discharge weight was 194 pounds.   Was admitted 4/20 with hematochezia and melena. His hemoglobin remained stable. No episodes of GI bleed after hospitalization. Underwent EGD by GI with finding of Grade 1 and a small esophageal varices, no evidence of recent bleeding,portal hypertensive gastropathy. Started on nadolol which made him very dizzy. He stopped this and feels a lot    PAH meds 1) Macitnentan 10 mg daily 2) Adcirca 40 mg daily  3) Selexipeg 1000/1200 daily  He presents via audio conferencing for a telehealth visit today.  Last week diuretics  increased by his PCP.  Overall feeling fair. Denies dizziness/syncope. Mild SOB with exertion. Denies PND/Orthopnea. Complaining of leg edema.  Appetite ok. No fever or chills. Weight at home 203 pounds. Says he has cut back his alcohol intake to once a week.  Taking all medications.    he denies symptoms worrisome for COVID 19.   Studies: ECHO 12/14 EF 55-60% Peak PA pressure 35. Severe RV dysfunction  ECHO 7/15 EF 60% RV moderately to severely dilated. Moderate HK RVSP 69m HG  ECHO 6/16 EF 60% RV moderately to severely dilated. Severe HK RVSP ~65 mm HG. D-shaped septum Echo 2/17 LVEF 60-65% RV massively dilated. Flat septum. Severe HK. Moderate TR RVSP ~65. IVC small. No effusion  ECHO 01/01/2017 EF 50-55% Grade I DD. RV severely dilated. Peak PA pressure 62 mm hg Echo 9/19 with EF 60-65% RV markedly dilated and hypokinetic with D-shaped septum. RVSP 620mG  Cath 6/17 Mid RCA lesion, 20% stenosed. Dist LAD lesion, 20% stenosed. Ao = 107/70 (87) LV = 106/3/11 RA = 4 RV = 74/11 PA = 74/26 (45) PCW = 6 Fick cardiac output/index = 4.6.2.2 PVR = 8.5 Ao sat = 95% PA sat = 61%, 58%  RHC 2/17 RA = 11 RV = 80/16/19 PA = 83/61 (71) PCW = 9 Fick cardiac output/index = 5.6/2.6 PVR = 10.3 WU Ao sat = 94% PA sat = 65%, 68% High SVC sat = 65%   PFTs 3/14 FEV1 2.02 L (58%) FVC 2.7L (54%) FEV1/FVC (89%) DLCO 53%  PFTs 1/17 FEV1 2.05 L (61%) FVC 2.48L (  56%) DLCO 44%   Ab u/s 8/16: + cirrhosis/mild to moderate splenomegaly  6 min walk 01/10/13, 1290 feet  6MW (01/10/14) = 1280 feet (380 m) 6MW (9/15) = 1350 feet (411 m) O2 sats ranged from 89-95% on room air, HR ranged from 100-129. 6MW (6/16) = 384 meters  6MW (2/17) = 1250 feet (329m 6MW (8/17) = 1320 feet      Past Medical History:  Diagnosis Date  . Alcohol dependence ([place] 03/22/2011   In remission   . Alcohol dependence in remission ([place] 03/22/2011   In remission   . Alcoholic cirrhosis of liver with  ascites ([facility] 2014  . Anginal pain ([place]   . CHF (congestive heart failure) ([place]   . COPD (chronic obstructive pulmonary disease) ([place]   . Coronary artery disease   . Dermatitis 06/10/2015  . Diarrhea 09/04/2013  . Dyspnea   . Elbow pain, left 03/17/2017  . GERD (gastroesophageal reflux disease)   . Gout   . Headache   . Heart murmur   . Hx of colonic polyps   . Hypertension   . Hypertriglyceridemia 03/17/2017  . Hypopotassemia   . Otalgia 11/28/2013  . Other chronic pulmonary heart diseases   . Preventative health care 06/29/2016  . Red eye 03/03/2016   right  . Renal insufficiency 07/18/2013  . Seizures ([facility]   . Sleep apnea    does not wear CPAP  . Thrombocytopenia ([place] 06/29/2016  . Unspecified hypothyroidism 01/25/2013  . Unspecified pleural effusion    Past Surgical History:  Procedure Laterality Date  . CARDIAC CATHETERIZATION N/A 12/21/2015   Procedure: Right Heart Cath;  Surgeon: DJolaine Artist MD;  [place];  Service: Cardiovascular;  Laterality: N/A;  . CARDIAC CATHETERIZATION N/A 04/15/2016   Procedure: Right/Left Heart Cath and Coronary Angiography;  Surgeon: DJolaine Artist MD;  [place] [facility];  Service: Cardiovascular;  Laterality: N/A;  . COLONOSCOPY N/A 12/08/2013   Procedure: COLONOSCOPY;  Surgeon: DMilus Banister MD;  [facility];  Service: Endoscopy;  Laterality: N/A;  . COLONOSCOPY WITH PROPOFOL N/A 03/24/2019   Procedure: COLONOSCOPY WITH PROPOFOL;  Surgeon: JMilus Banister MD;  Location: [facility];  Service: Endoscopy;  Laterality: N/A;  Draw CBC in preop  . ESOPHAGOGASTRODUODENOSCOPY (EGD) WITH PROPOFOL N/A 03/27/2016   Procedure: ESOPHAGOGASTRODUODENOSCOPY (EGD) WITH PROPOFOL;  Surgeon: DMilus Banister MD;  [place]  Service: Endoscopy;  Laterality: N/A;  . ESOPHAGOGASTRODUODENOSCOPY (EGD) WITH PROPOFOL N/A 02/14/2019   Procedure: ESOPHAGOGASTRODUODENOSCOPY (EGD) WITH PROPOFOL;  Surgeon:  PJerene Bears MD;  [facility];  Service: Gastroenterology;  Laterality: N/A;  . EYE SURGERY Bilateral 03/15/2019   lasix  . LOOP RECORDER INSERTION N/A 01/01/2017   Procedure: Loop Recorder Insertion;  Surgeon: JThompson Grayer MD;  [place] [facility];  Service: Cardiovascular;  Laterality: N/A;  . RIGHT HEART CATHETERIZATION Right 06/06/2014   Procedure: RIGHT HEART CATH;  Surgeon: DJolaine Artist MD;  [facility];  Service: Cardiovascular;  Laterality: Right;  . UVULOPALATOPHARYNGOPLASTY  1999     Current Outpatient Medications  Medication Sig Dispense Refill  . acetaminophen (TYLENOL) 500 MG tablet Take 1,000 mg by mouth every 6 (six) hours as needed (for pain.).    .Marland KitchenAlbuterol Sulfate (PROAIR RESPICLICK) 1638(90 Base) MCG/ACT AEPB Inhale 2 puffs into the lungs every 6 (six) hours as needed (SOB or wheezing). (Patient taking differently: Inhale 2 puffs into the lungs See admin instructions. Inhale  2 puffs scheduled twice daily & every 6 hours as needed wheezing/shortness of breath.) 3 each 1  . azelastine (ASTELIN) 0.1 % nasal spray Place 2 sprays into both nostrils 2 (two) times daily. Use in each nostril as directed (Patient taking differently: Place 2 sprays into both nostrils daily as needed for rhinitis or allergies. ) 30 mL 1  . Blood Pressure Monitoring (BLOOD PRESSURE CUFF) MISC Use as directed. Check blood pressure bid prn Dx:I10 1 each 0  . cholecalciferol (VITAMIN D) 25 MCG (1000 UT) tablet Take 1,000 Units by mouth daily.    . diphenoxylate-atropine (LOMOTIL) 2.5-0.025 MG tablet Take 1 tablet by mouth 3 (three) times daily. (Patient taking differently: Take 1 tablet by mouth See admin instructions. Take 1 tablet by mouth scheduled twice daily & may take an additional dose if needed for diarrhea/loose stools) 60 tablet 0  . Ferrous Fumarate (HEMOCYTE) 324 (106 Fe) MG TABS tablet Take 1 tablet (106 mg of iron total) by mouth daily. 30 tablet 5  .  fluticasone-salmeterol (ADVAIR HFA) 230-21 MCG/ACT inhaler Inhale 2 puffs into the lungs every morning. 48 g 1  . folic acid (FOLVITE) 1 MG tablet Take 1 tablet (1 mg total) by mouth daily. 30 tablet 5  . furosemide (LASIX) 40 MG tablet Take 1 tablet (40 mg total) by mouth 2 (two) times daily. 60 tablet 3  . gatifloxacin (ZYMAXID) 0.5 % SOLN Place 1 drop into the left eye daily.     . hyoscyamine (LEVSIN SL) 0.125 MG SL tablet Place 1 tablet (0.125 mg total) under the tongue every 6 (six) hours as needed. (Patient taking differently: Place 0.125 mg under the tongue every 6 (six) hours as needed for cramping (stomach pains.). ) 90 tablet 3  . macitentan (OPSUMIT) 10 MG tablet Take 1 tablet (10 mg total) by mouth daily. 30 tablet 11  . metolazone (ZAROXOLYN) 2.5 MG tablet Take 1 tablet (2.5 mg total) by mouth daily. 20 tablet 0  . pantoprazole (PROTONIX) 40 MG tablet Take 1 tablet (40 mg total) by mouth daily before breakfast. 30 tablet 0  . potassium chloride (K-DUR) 10 MEQ tablet Take 2 tablets (20 mEq total) by mouth daily. 60 tablet 3  . rosuvastatin (CRESTOR) 5 MG tablet Take 1 tablet (5 mg total) by mouth daily. 90 tablet 3  . Selexipag (UPTRAVI) 1000 MCG TABS Take 1,000 mcg by mouth 2 (two) times daily. 60 tablet 11  . spironolactone (ALDACTONE) 50 MG tablet TAKE 1 TABLET DAILY (Patient taking differently: Take 50 mg by mouth daily. ) 90 tablet 1  . tadalafil, PAH, (ADCIRCA) 20 MG tablet TAKE 2 TABLETS (40 MG TOTAL) DAILY (Patient taking differently: Take 40 mg by mouth daily. ) 60 tablet 12  . thiamine 100 MG tablet Take 1 tablet (100 mg total) by mouth daily. 30 tablet 5  . tiZANidine (ZANAFLEX) 2 MG tablet Take 0.5-2 tablets (1-4 mg total) by mouth every 6 (six) hours as needed for muscle spasms. 30 tablet 0  . allopurinol (ZYLOPRIM) 100 MG tablet Take 2 tablets (200 mg total) by mouth daily. (Patient not taking: Reported on 04/14/2019) 180 tablet 1   No current facility-administered  medications for this encounter.     Allergies:   Aspirin and Nitroglycerin   Social History:  The patient  reports that he quit smoking about 39 years ago. His smoking use included cigarettes. He has a 10.00 pack-year smoking history. He has never used smokeless tobacco. He reports current alcohol use  of about 6.0 standard drinks of alcohol per week. He reports that he does not use drugs.   Family History:  The patient's family history includes Alcoholism in his maternal grandfather; Cancer in his father; Gout in his brother; Heart attack in his mother; Heart disease in his mother; Hypertension in his brother, brother, and mother; Kidney failure in his mother; Liver disease in his maternal grandfather and mother; Migraines in his sister; Prostate cancer in his father.   ROS:  Please see the history of present illness.   All other systems are personally reviewed and negative.   Exam: Tele Health Call; Exam is subjective  General:  Speaks in full sentences. No resp difficulty. Lungs: Normal respiratory effort with conversation.  Abdomen: Non-distended per patient report Extremities: He reports leg edema.  Neuro: Alert & oriented x 3.   Recent Labs: 09/27/2018: Pro B Natriuretic peptide (BNP) 415.0 02/12/2019: B Natriuretic Peptide 158.1 04/13/2019: ALT 15; BUN 35; Creatinine, Ser 1.98; Hemoglobin 10.2; Magnesium 1.7; Platelets 29.0 Repeated and verified X2.; Potassium 3.2; Sodium 138; TSH 1.96  Personally reviewed   Wt Readings from Last 3 Encounters:  04/14/19 92.1 kg (203 lb)  03/21/19 98.2 kg (216 lb 9.6 oz)  03/08/19 92.5 kg (204 lb)      ASSESSMENT AND PLAN:  1. PAH: Suspected portopulmonary HTN in setting of ETOH cirrhosis. - Stable NYHA III.6MW 9/19 1280 feet (390 meters) - Echo 9/19 with EF 60-65% RV markedly dilated and hypokinetic with D-shaped septum - Continue selexipag 1000 mcg twice a day. Intolerant higher dose due to diarrhea and headaches .  - Continue macitentan  10 daily and adcirca 40 mg daily. - With RV strain on echo may need to consider IV therapies in the near future. -NYHA III. Volume status elevated. Diuretics increased by PCP. I have asked him to call HF clinic on Monday if he remains edematous.   2. CKD III:  -Baseline creatinine 1.5-2.0 Labs check on 6/10 by by PCP. Creatinine 1.9  - Followed by Dr. Marval Regal   3. Cirrhosis:  - Likely combination of RV failure and ETOH - Follows with Dr. Ardis Hughs. Has appt in 2 weeks - Did not tolerate nadolol.  - Recent EGC 4/20 stable - Discussed importance of eliminating all alcohol intake.   4. Recurrent syncope/seizure - by history event sounds like a seizure but need to make sure not cardiac source. Seen in ED 1/15. ETOH level high,. CT brain ok.  - LINQ interrogations have shown no arrhythmias and thus most likely seizure - Continue Keppra. Follows with Dr. Delice Lesch  5. HTN:  6. OSA:  - Has mild OSA with AHI 12 and desats down to 82%.  - Intolerant CPAP.   7. Pancytopenia:  - Has seen Dr. Alen Blew in Hematology - felt like it is due to splenic sequestration.  -Platlelets down to 29 on 6/10. He has follow up with hematology.     COVID screen The patient does not have any symptoms that suggest any further testing/ screening at this time.  Social distancing reinforced today.  Patient Risk: After full review of this patients clinical status, I feel that they are at moderate risk for cardiac decompensation at this time.  Relevant cardiac medications were reviewed at length with the patient today. The patient does not have concerns regarding their medications at this time.   The following changes were made today:  None  Recommended follow-up:  Dr Haroldine Laws in 3 months with an ECHO   Today, I  have spent 11 minutes with the patient with telehealth technology discussing the above issues .    Jeanmarie Hubert, NP  04/14/2019 10:22 AM  [place] [address] [facility] [place] [address] [phone] (office) [phone] (fax)

## 2019-04-14 NOTE — Addendum Note (Signed)
Encounter addended by: Jovita Kussmaul, RN on: 04/14/2019 10:41 AM  Actions taken: Order list changed, Diagnosis association updated

## 2019-04-15 ENCOUNTER — Other Ambulatory Visit: Payer: Self-pay | Admitting: Family Medicine

## 2019-04-15 MED FILL — ALLOPURINOL 100 MG TABS: 100 | 90 days supply | Qty: 180 | Fill #1

## 2019-04-17 LAB — CUP PACEART REMOTE DEVICE CHECK
Date Time Interrogation Session: 20200613113810
Implantable Pulse Generator Implant Date: 20180301

## 2019-04-18 ENCOUNTER — Other Ambulatory Visit: Payer: Self-pay | Admitting: Gastroenterology

## 2019-04-18 ENCOUNTER — Ambulatory Visit (INDEPENDENT_AMBULATORY_CARE_PROVIDER_SITE_OTHER): Payer: Medicare Other | Admitting: *Deleted

## 2019-04-18 ENCOUNTER — Telehealth: Payer: Self-pay | Admitting: Gastroenterology

## 2019-04-18 DIAGNOSIS — R55 Syncope and collapse: Secondary | ICD-10-CM | POA: Diagnosis not present

## 2019-04-18 DIAGNOSIS — K625 Hemorrhage of anus and rectum: Secondary | ICD-10-CM

## 2019-04-18 DIAGNOSIS — K635 Polyp of colon: Secondary | ICD-10-CM

## 2019-04-18 MED ORDER — PEG 3350-KCL-NA BICARB-NACL 420 G PO SOLR: 4000.0000 mL | ORAL | 0 refills | Status: DC

## 2019-04-18 MED FILL — PEG-3350 SOLUTION: 420 | 1 days supply | Qty: 4000 | Fill #0

## 2019-04-18 MED FILL — DOXEPIN 10 MG CAPSULE: 10 | 30 days supply | Qty: 60 | Fill #0

## 2019-04-18 NOTE — Telephone Encounter (Signed)
Dr Ardis Hughs is it ok to go ahead and schedule Mr Dilauro for colon with labs and platelets?

## 2019-04-18 NOTE — Telephone Encounter (Signed)
Darren Mueller are you working on this?

## 2019-04-18 NOTE — Telephone Encounter (Signed)
Yes I am.

## 2019-04-18 NOTE — Telephone Encounter (Signed)
He needs repeat colonoscopy as soon as hospitals are allowing to schedule 'elective cases' again.  I suspect it will be on a Thursday and so on the Monday just prior he needs cbc, cmet, inr checked.  He will need platelet transfusion (and possibly FFP as well) very early the morning of his colonoscopy. I will decide exactly what and how much he will need tranfsused after seeing his Monday labs.

## 2019-04-18 NOTE — Telephone Encounter (Signed)
Yes, I think Claib[facility] Billings may be working on this as well.

## 2019-04-25 ENCOUNTER — Other Ambulatory Visit: Payer: Self-pay

## 2019-04-25 ENCOUNTER — Other Ambulatory Visit (HOSPITAL_COMMUNITY)
Admission: RE | Admit: 2019-04-25 | Discharge: 2019-04-25 | Disposition: A | Discharge status: Home / Self Care | Payer: Medicare Other | Source: Ambulatory Visit | Attending: Gastroenterology | Admitting: Gastroenterology

## 2019-04-25 ENCOUNTER — Other Ambulatory Visit (INDEPENDENT_AMBULATORY_CARE_PROVIDER_SITE_OTHER): Payer: Medicare Other

## 2019-04-25 ENCOUNTER — Other Ambulatory Visit: Payer: Self-pay | Admitting: Gastroenterology

## 2019-04-25 DIAGNOSIS — Z1159 Encounter for screening for other viral diseases: Secondary | ICD-10-CM | POA: Diagnosis not present

## 2019-04-25 DIAGNOSIS — K635 Polyp of colon: Secondary | ICD-10-CM | POA: Diagnosis not present

## 2019-04-25 DIAGNOSIS — K625 Hemorrhage of anus and rectum: Secondary | ICD-10-CM

## 2019-04-25 LAB — CBC WITH DIFFERENTIAL/PLATELET
Basophils Absolute: 0 10*3/uL (ref 0.0–0.1)
Basophils Relative: 0.2 % (ref 0.0–3.0)
Eosinophils Absolute: 0 10*3/uL (ref 0.0–0.7)
Eosinophils Relative: 2.3 % (ref 0.0–5.0)
HCT: 32 % — ABNORMAL LOW (ref 39.0–52.0)
Hemoglobin: 10.4 g/dL — ABNORMAL LOW (ref 13.0–17.0)
Lymphocytes Relative: 28.4 % (ref 12.0–46.0)
Lymphs Abs: 0.6 10*3/uL — ABNORMAL LOW (ref 0.7–4.0)
MCHC: 32.6 g/dL (ref 30.0–36.0)
MCV: 101.5 fl — ABNORMAL HIGH (ref 78.0–100.0)
Monocytes Absolute: 0.3 10*3/uL (ref 0.1–1.0)
Monocytes Relative: 17 % — ABNORMAL HIGH (ref 3.0–12.0)
Neutro Abs: 1 10*3/uL — ABNORMAL LOW (ref 1.4–7.7)
Neutrophils Relative %: 52.1 % (ref 43.0–77.0)
Platelets: 26 10*3/uL — CL (ref 150.0–400.0)
RBC: 3.15 Mil/uL — ABNORMAL LOW (ref 4.22–5.81)
RDW: 23.7 % — ABNORMAL HIGH (ref 11.5–15.5)
WBC: 2 10*3/uL — ABNORMAL LOW (ref 4.0–10.5)

## 2019-04-25 LAB — PROTIME-INR
INR: 1.2 ratio — ABNORMAL HIGH (ref 0.8–1.0)
Prothrombin Time: 14 s — ABNORMAL HIGH (ref 9.6–13.1)

## 2019-04-25 LAB — COMPREHENSIVE METABOLIC PANEL
ALT: 19 U/L (ref 0–53)
AST: 57 U/L — ABNORMAL HIGH (ref 0–37)
Albumin: 3.5 g/dL (ref 3.5–5.2)
Alkaline Phosphatase: 381 U/L — ABNORMAL HIGH (ref 39–117)
BUN: 25 mg/dL — ABNORMAL HIGH (ref 6–23)
CO2: 19 mEq/L (ref 19–32)
Calcium: 8.5 mg/dL (ref 8.4–10.5)
Chloride: 105 mEq/L (ref 96–112)
Creatinine, Ser: 1.77 mg/dL — ABNORMAL HIGH (ref 0.40–1.50)
GFR: 46.35 mL/min — ABNORMAL LOW (ref >60.00)
Glucose, Bld: 110 mg/dL — ABNORMAL HIGH (ref 70–99)
Potassium: 3.7 mEq/L (ref 3.5–5.1)
Sodium: 136 mEq/L (ref 135–145)
Total Bilirubin: 1.9 mg/dL — ABNORMAL HIGH (ref 0.2–1.2)
Total Protein: 7.1 g/dL (ref 6.0–8.3)

## 2019-04-25 LAB — SARS CORONAVIRUS 2 (TAT 6-24 HRS): SARS Coronavirus 2: NEGATIVE

## 2019-04-25 MED ORDER — SODIUM CHLORIDE 0.9% IV SOLUTION: Freq: Once | INTRAVENOUS | Status: DC

## 2019-04-26 ENCOUNTER — Encounter (HOSPITAL_COMMUNITY): Payer: Self-pay

## 2019-04-26 ENCOUNTER — Other Ambulatory Visit: Payer: Self-pay

## 2019-04-26 ENCOUNTER — Other Ambulatory Visit (HOSPITAL_COMMUNITY): Payer: Self-pay

## 2019-04-26 ENCOUNTER — Telehealth: Payer: Self-pay | Admitting: Gastroenterology

## 2019-04-26 DIAGNOSIS — K625 Hemorrhage of anus and rectum: Secondary | ICD-10-CM

## 2019-04-26 DIAGNOSIS — I272 Pulmonary hypertension, unspecified: Secondary | ICD-10-CM

## 2019-04-26 MED ORDER — SODIUM CHLORIDE 0.9% IV SOLUTION: Freq: Once | INTRAVENOUS | Status: DC

## 2019-04-26 MED ORDER — OPSUMIT 10 MG PO TABS: 10.0000 mg | ORAL_TABLET | Freq: Every day | ORAL | 0 refills | Status: DC

## 2019-04-26 NOTE — Telephone Encounter (Signed)
Type and screen has been ordered per request from [facility]

## 2019-04-26 NOTE — Telephone Encounter (Signed)
Levada Dy at [facility] unit calling regarding lab orders for pt, they need clarification if pt really needs 3 labs and they are also missing other orders. pls call Levada Dy. Pt needs blood work before his procedure on 6/25

## 2019-04-26 NOTE — Progress Notes (Signed)
Carelink Summary Report / Loop Recorder 

## 2019-04-27 ENCOUNTER — Other Ambulatory Visit: Payer: Self-pay

## 2019-04-27 ENCOUNTER — Encounter (HOSPITAL_COMMUNITY): Payer: Self-pay

## 2019-04-27 ENCOUNTER — Ambulatory Visit (HOSPITAL_COMMUNITY)
Admission: RE | Admit: 2019-04-27 | Discharge: 2019-04-27 | Disposition: A | Discharge status: Home / Self Care | Payer: Medicare Other | Source: Ambulatory Visit | Attending: Gastroenterology | Admitting: Gastroenterology

## 2019-04-27 DIAGNOSIS — K625 Hemorrhage of anus and rectum: Secondary | ICD-10-CM | POA: Insufficient documentation

## 2019-04-27 LAB — TYPE AND SCREEN
ABO/RH(D): O POS
Antibody Screen: NEGATIVE

## 2019-04-27 LAB — ABO/RH: ABO/RH(D): O POS

## 2019-04-27 MED ORDER — SODIUM CHLORIDE 0.9% IV SOLUTION
Freq: Once | INTRAVENOUS | Status: AC
  Administered 2019-04-27: 10:00:00 via INTRAVENOUS

## 2019-04-27 NOTE — Progress Notes (Signed)
Patient received 3 units of platelets via a PIV as ordered. Pre transfusion type and screen was done. Tolerated well, vitals stable, discharge instructions given, verbalized understanding. Patient alert, oriented and ambulatory at the time of discharge.

## 2019-04-27 NOTE — Progress Notes (Signed)
THE FOLLOWING ARE IN Epic:  EKG AND CXR 02/2019 ECHO 07/2018 LAST OFFICE VISIT [facility] NP  SPOKE W/  Aul     SCREENING SYMPTOMS OF COVID 19:   COUGH--no  RUNNY NOSE--- no  SORE THROAT---no  NASAL CONGESTION----no  SNEEZING----no  SHORTNESS OF BREATH---no  DIFFICULTY BREATHING---no  TEMP >100.0 -----no  UNEXPLAINED BODY ACHES------no CHILLS --------no   HEADACHES ---------no  LOSS OF SMELL/ TASTE --------no    HAVE YOU OR ANY FAMILY MEMBER TRAVELLED PAST 14 DAYS OUT OF THE   COUNTY---no STATE----no COUNTRY----no  HAVE YOU OR ANY FAMILY MEMBER BEEN EXPOSED TO ANYONE WITH COVID 19? no

## 2019-04-27 NOTE — Discharge Instructions (Signed)
Blood Transfusion, Adult, Care After This sheet gives you information about how to care for yourself after your procedure. Your doctor may also give you more specific instructions. If you have problems or questions, contact your doctor. Follow these instructions at home:   Take over-the-counter and prescription medicines only as told by your doctor.  Go back to your normal activities as told by your doctor.  Follow instructions from your doctor about how to take care of the area where an IV tube was put into your vein (insertion site). Make sure you: ? Wash your hands with soap and water before you change your bandage (dressing). If there is no soap and water, use hand sanitizer. ? Change your bandage as told by your doctor.  Check your IV insertion site every day for signs of infection. Check for: ? More redness, swelling, or pain. ? More fluid or blood. ? Warmth. ? Pus or a bad smell. Contact a doctor if:  You have more redness, swelling, or pain around the IV insertion site.  You have more fluid or blood coming from the IV insertion site.  Your IV insertion site feels warm to the touch.  You have pus or a bad smell coming from the IV insertion site.  Your pee (urine) turns pink, red, or brown.  You feel weak after doing your normal activities. Get help right away if:  You have signs of a serious allergic or body defense (immune) system reaction, including: ? Itchiness. ? Hives. ? Trouble breathing. ? Anxiety. ? Pain in your chest or lower back. ? Fever, flushing, and chills. ? Fast pulse. ? Rash. ? Watery poop (diarrhea). ? Throwing up (vomiting). ? Dark pee. ? Serious headache. ? Dizziness. ? Stiff neck. ? Yellow color in your face or the white parts of your eyes (jaundice). Summary  After a blood transfusion, return to your normal activities as told by your doctor.  Every day, check for signs of infection where the IV tube was put into your vein.  Some  signs of infection are warm skin, more redness and pain, more fluid or blood, and pus or a bad smell where the needle went in.  Contact your doctor if you feel weak or have any unusual symptoms. This information is not intended to replace advice given to you by your health care provider. Make sure you discuss any questions you have with your health care provider. Document Released: 11/10/2014 Document Revised: 06/13/2016 Document Reviewed: 06/13/2016 Elsevier Interactive Patient Education  [employer].

## 2019-04-28 ENCOUNTER — Encounter (HOSPITAL_COMMUNITY): Payer: Self-pay | Admitting: *Deleted

## 2019-04-28 ENCOUNTER — Encounter (HOSPITAL_COMMUNITY)
Admission: RE | Disposition: A | Discharge status: Home / Self Care | Payer: Self-pay | Source: Ambulatory Visit | Attending: Gastroenterology

## 2019-04-28 ENCOUNTER — Ambulatory Visit (HOSPITAL_COMMUNITY): Payer: Medicare Other | Admitting: Anesthesiology

## 2019-04-28 ENCOUNTER — Ambulatory Visit (HOSPITAL_COMMUNITY)
Admission: RE | Admit: 2019-04-28 | Discharge: 2019-04-28 | Disposition: A | Discharge status: Home / Self Care | Payer: Medicare Other | Source: Ambulatory Visit | Attending: Gastroenterology | Admitting: Gastroenterology

## 2019-04-28 DIAGNOSIS — Z86010 Personal history of colonic polyps: Secondary | ICD-10-CM | POA: Diagnosis not present

## 2019-04-28 DIAGNOSIS — D120 Benign neoplasm of cecum: Secondary | ICD-10-CM | POA: Insufficient documentation

## 2019-04-28 DIAGNOSIS — K635 Polyp of colon: Secondary | ICD-10-CM

## 2019-04-28 DIAGNOSIS — K7460 Unspecified cirrhosis of liver: Secondary | ICD-10-CM | POA: Diagnosis not present

## 2019-04-28 DIAGNOSIS — K219 Gastro-esophageal reflux disease without esophagitis: Secondary | ICD-10-CM | POA: Insufficient documentation

## 2019-04-28 DIAGNOSIS — I272 Pulmonary hypertension, unspecified: Secondary | ICD-10-CM | POA: Insufficient documentation

## 2019-04-28 DIAGNOSIS — K766 Portal hypertension: Secondary | ICD-10-CM | POA: Diagnosis not present

## 2019-04-28 DIAGNOSIS — D61818 Other pancytopenia: Secondary | ICD-10-CM | POA: Insufficient documentation

## 2019-04-28 DIAGNOSIS — J449 Chronic obstructive pulmonary disease, unspecified: Secondary | ICD-10-CM | POA: Insufficient documentation

## 2019-04-28 DIAGNOSIS — I130 Hypertensive heart and chronic kidney disease with heart failure and stage 1 through stage 4 chronic kidney disease, or unspecified chronic kidney disease: Secondary | ICD-10-CM | POA: Diagnosis not present

## 2019-04-28 DIAGNOSIS — I509 Heart failure, unspecified: Secondary | ICD-10-CM | POA: Diagnosis not present

## 2019-04-28 DIAGNOSIS — Z8719 Personal history of other diseases of the digestive system: Secondary | ICD-10-CM | POA: Diagnosis not present

## 2019-04-28 DIAGNOSIS — I2510 Atherosclerotic heart disease of native coronary artery without angina pectoris: Secondary | ICD-10-CM | POA: Insufficient documentation

## 2019-04-28 DIAGNOSIS — N183 Chronic kidney disease, stage 3 (moderate): Secondary | ICD-10-CM | POA: Diagnosis not present

## 2019-04-28 DIAGNOSIS — Z87891 Personal history of nicotine dependence: Secondary | ICD-10-CM | POA: Diagnosis not present

## 2019-04-28 DIAGNOSIS — I110 Hypertensive heart disease with heart failure: Secondary | ICD-10-CM | POA: Diagnosis not present

## 2019-04-28 DIAGNOSIS — Z79899 Other long term (current) drug therapy: Secondary | ICD-10-CM | POA: Insufficient documentation

## 2019-04-28 DIAGNOSIS — K625 Hemorrhage of anus and rectum: Secondary | ICD-10-CM

## 2019-04-28 DIAGNOSIS — D649 Anemia, unspecified: Secondary | ICD-10-CM | POA: Diagnosis not present

## 2019-04-28 DIAGNOSIS — Z7951 Long term (current) use of inhaled steroids: Secondary | ICD-10-CM | POA: Diagnosis not present

## 2019-04-28 DIAGNOSIS — G4733 Obstructive sleep apnea (adult) (pediatric): Secondary | ICD-10-CM | POA: Diagnosis not present

## 2019-04-28 DIAGNOSIS — E781 Pure hyperglyceridemia: Secondary | ICD-10-CM | POA: Insufficient documentation

## 2019-04-28 DIAGNOSIS — R55 Syncope and collapse: Secondary | ICD-10-CM | POA: Diagnosis not present

## 2019-04-28 DIAGNOSIS — R05 Cough: Secondary | ICD-10-CM | POA: Diagnosis not present

## 2019-04-28 HISTORY — DX: Secondary pulmonary arterial hypertension: I27.21

## 2019-04-28 HISTORY — PX: COLONOSCOPY WITH PROPOFOL: SHX5780

## 2019-04-28 HISTORY — PX: HEMOSTASIS CLIP PLACEMENT: SHX6857

## 2019-04-28 HISTORY — DX: Unspecified hearing loss, unspecified ear: H91.90

## 2019-04-28 HISTORY — DX: Hemorrhage of anus and rectum: K62.5

## 2019-04-28 HISTORY — DX: Dizziness and giddiness: R42

## 2019-04-28 HISTORY — DX: Other pancytopenia: D61.818

## 2019-04-28 HISTORY — PX: POLYPECTOMY: SHX5525

## 2019-04-28 LAB — PREPARE PLATELET PHERESIS
Unit division: 0
Unit division: 0
Unit division: 0

## 2019-04-28 LAB — BPAM PLATELET PHERESIS
Blood Product Expiration Date: 202006242359
Blood Product Expiration Date: 202006252359
Blood Product Expiration Date: 202006252359
ISSUE DATE / TIME: 202006240948
ISSUE DATE / TIME: 202006241141
ISSUE DATE / TIME: 202006241309
Unit Type and Rh: 5100
Unit Type and Rh: 5100
Unit Type and Rh: 6200

## 2019-04-28 SURGERY — COLONOSCOPY WITH PROPOFOL
Anesthesia: Monitor Anesthesia Care

## 2019-04-28 MED ORDER — PHENYLEPHRINE 40 MCG/ML (10ML) SYRINGE FOR IV PUSH (FOR BLOOD PRESSURE SUPPORT)
PREFILLED_SYRINGE | INTRAVENOUS | Status: DC | PRN
  Administered 2019-04-28: 120 ug via INTRAVENOUS

## 2019-04-28 MED ORDER — SODIUM CHLORIDE 0.9 % IV SOLN
INTRAVENOUS | Status: DC
  Administered 2019-04-28: 09:00:00 via INTRAVENOUS

## 2019-04-28 MED ORDER — PROMETHAZINE HCL 25 MG/ML IJ SOLN: 6.2500 mg | INTRAMUSCULAR | Status: DC | PRN

## 2019-04-28 MED ORDER — PROPOFOL 10 MG/ML IV BOLUS
INTRAVENOUS | Status: AC
  Filled 2019-04-28: qty 20

## 2019-04-28 MED ORDER — PROPOFOL 10 MG/ML IV BOLUS
INTRAVENOUS | Status: DC | PRN
  Administered 2019-04-28: 40 mg via INTRAVENOUS
  Administered 2019-04-28: 20 mg via INTRAVENOUS
  Administered 2019-04-28: 40 mg via INTRAVENOUS
  Administered 2019-04-28 (×2): 20 mg via INTRAVENOUS
  Administered 2019-04-28 (×2): 40 mg via INTRAVENOUS

## 2019-04-28 MED ORDER — LIDOCAINE 2% (20 MG/ML) 5 ML SYRINGE
INTRAMUSCULAR | Status: DC | PRN
  Administered 2019-04-28: 40 mg via INTRAVENOUS

## 2019-04-28 MED ORDER — PROPOFOL 10 MG/ML IV BOLUS
INTRAVENOUS | Status: AC
  Filled 2019-04-28: qty 40

## 2019-04-28 SURGICAL SUPPLY — 22 items

## 2019-04-28 NOTE — Anesthesia Procedure Notes (Signed)
Procedure Name: MAC Date/Time: 04/28/2019 9:28 AM Performed by: Cynda Familia, CRNA Pre-anesthesia Checklist: Patient identified, Emergency Drugs available, Suction available, Patient being monitored and Timeout performed Patient Re-evaluated:Patient Re-evaluated prior to induction Oxygen Delivery Method: Simple face mask Placement Confirmation: positive ETCO2 and breath sounds checked- equal and bilateral Dental Injury: Teeth and Oropharynx as per pre-operative assessment

## 2019-04-28 NOTE — Discharge Instructions (Signed)

## 2019-04-28 NOTE — Op Note (Signed)
[facility] Patient Name: Darren Mueller Procedure Date: 04/28/2019 MRN: [id number] Attending MD: Milus Banister , MD Date of Birth: 05-22-1951 CSN: [id number] Age: 68 Admit Type: Outpatient Procedure:                Colonoscopy Indications:              High risk colon cancer surveillance: Personal                            history of colonic polyps. Colonoscopy three weeks                            ago, three cecal polyps found including a 1.9cm                            pedunculated polyp. None were removed due to very                            low platelets that failed to improved after                            mulpleta protocol. He received three units plts                            yesterday. Providers:                Milus Banister, MD, Cleda Daub, RN, Marguerita Merles, Technician Referring MD:              Medicines:                Monitored Anesthesia Care Complications:            No immediate complications. Estimated blood loss:                            None. Estimated Blood Loss:     Estimated blood loss: none. Procedure:                Pre-Anesthesia Assessment:                           - Prior to the procedure, a History and Physical                            was performed, and patient medications and                            allergies were reviewed. The patient's tolerance of                            previous anesthesia was also reviewed. The risks                            and benefits of the procedure and the  sedation                            options and risks were discussed with the patient.                            All questions were answered, and informed consent                            was obtained. Prior Anticoagulants: The patient has                            taken no previous anticoagulant or antiplatelet                            agents. ASA Grade Assessment: IV - A patient with                       severe systemic disease that is a constant threat                            to life. After reviewing the risks and benefits,                            the patient was deemed in satisfactory condition to                            undergo the procedure.                           After obtaining informed consent, the colonoscope                            was passed under direct vision. Throughout the                            procedure, the patient's blood pressure, pulse, and                            oxygen saturations were monitored continuously. The                            CF-HQ190L ([id number] Olympus colonoscope was                            introduced through the anus and advanced to the the                            cecum, identified by appendiceal orifice and                            ileocecal valve. The colonoscopy was performed                            without difficulty. The  patient tolerated the                            procedure fairly well but did have coughing during                            the case, while laying on his side. Very well                            managed by CRNA and anesthesiologist using oral                            suction, monitoring. The quality of the bowel                            preparation was good. The ileocecal valve,                            appendiceal orifice, and rectum were photographed. Scope In: 9:34:41 AM Scope Out: 9:54:31 AM Scope Withdrawal Time: 0 hours 16 minutes 40 seconds  Total Procedure Duration: 0 hours 19 minutes 50 seconds  Findings:      Two sessile polyps were found in the cecum. The polyps were 3 to 6 mm in       size. These polyps were removed with a cold snare. Resection and       retrieval were complete. Jar 1. There was slightly more post polypectomy       oozing than is usual but this stopped with time, saline flushes.      A third, larger polyp was also found in the cecum.  This was a       pedunculated 19 mm polyp. I elected to place two Resolution Clips prior       to polypectomy to help prevent post polypectomy bleeding. I then       resected the polyp with cold snare. There was some oozing following       polypectomy and so I placed another, larger (DuraClip) at the site with       complete hemostasis. The polyp was retrieved, Jar 2.      The exam was otherwise without abnormality on direct and retroflexion       views. Impression:               - Two sessile polyps were found in the cecum. The                            polyps were 3 to 6 mm in size. These polyps were                            removed with a cold snare. Resection and retrieval                            were complete. Jar 1. There was slightly more post                            polypectomy oozing than is usual but  this stopped                            with time, saline flushes.                           - A third, larger polyp was also found in the                            cecum. This was a pedunculated 19 mm polyp. I                            elected to place two Resolution Clips prior to                            polypectomy to help prevent post polypectomy                            bleeding. I then resected the polyp with cold                            snare. There was some oozing following polypectomy                            and so I placed another, larger (DuraClip) at the                            site with complete hemostasis. The polyp was                            retrieved, Jar 2.                           - Coughing during the procedure was very well                            managed by CRNA, anesthesiologist but he may have                            had minor aspiration. Moderate Sedation:      Not Applicable - Patient had care per Anesthesia. Recommendation:           - Patient has a contact number available for                            emergencies. The  signs and symptoms of potential                            delayed complications were discussed with the                            patient. Return to normal activities tomorrow.  Written discharge instructions were provided to the                            patient.                           - Resume previous diet.                           - Continue present medications.                           - Await pathology results. Procedure Code(s):        --- Professional ---                           [id number], Colonoscopy, flexible; with removal of                            tumor(s), polyp(s), or other lesion(s) by snare                            technique Diagnosis Code(s):        --- Professional ---                           K63.5, Polyp of colon                           Z86.010, Personal history of colonic polyps CPT copyright 2019 [employer]. All rights reserved. The codes documented in this report are preliminary and upon coder review may  be revised to meet current compliance requirements. Milus Banister, MD 04/28/2019 10:05:57 AM This report has been signed electronically. Number of Addenda: 0

## 2019-04-28 NOTE — Transfer of Care (Signed)
Immediate Anesthesia Transfer of Care Note  Patient: Darren Mueller  Procedure(s) Performed: COLONOSCOPY WITH PROPOFOL (N/A ) POLYPECTOMY HEMOSTASIS CLIP PLACEMENT  Patient Location: PACU and Endoscopy Unit  Anesthesia Type:MAC  Level of Consciousness: awake and alert   Airway & Oxygen Therapy: Patient Spontanous Breathing and Patient connected to face mask oxygen  Post-op Assessment: Report given to RN and Post -op Vital signs reviewed and stable  Post vital signs: Reviewed and stable  Last Vitals:  Vitals Value Taken Time  BP 108/67 04/28/19 1004  Temp    Pulse 77 04/28/19 1007  Resp 25 04/28/19 1007  SpO2 97 % 04/28/19 1007  Vitals shown include unvalidated device data.  Last Pain:  Vitals:   04/28/19 0847  TempSrc: Oral  PainSc: 0-No pain         Complications: No apparent anesthesia complications

## 2019-04-28 NOTE — Anesthesia Postprocedure Evaluation (Signed)
Anesthesia Post Note  Patient: Darren Mueller  Procedure(s) Performed: COLONOSCOPY WITH PROPOFOL (N/A ) POLYPECTOMY HEMOSTASIS CLIP PLACEMENT     Patient location during evaluation: PACU Anesthesia Type: MAC Level of consciousness: awake and alert Pain management: pain level controlled Vital Signs Assessment: post-procedure vital signs reviewed and stable Respiratory status: spontaneous breathing Cardiovascular status: stable Anesthetic complications: no Comments: Pt with possible aspiration during procedure. Was not notified of issues post op, and he was discharged from recovery prior to my being able to evaluate him.    Last Vitals:  Vitals:   04/28/19 1010 04/28/19 1020  BP: 109/62 114/81  Pulse: 77 80  Resp: (!) 22 (!) 24  Temp:    SpO2: 98% 100%    Last Pain:  Vitals:   04/28/19 1020  TempSrc:   PainSc: 0-No pain                 Nolon Nations

## 2019-04-28 NOTE — Interval H&P Note (Signed)
History and Physical Interval Note:  04/28/2019 8:35 AM  Darren Mueller  has presented today for surgery, with the diagnosis of rectal bleeding.  The various methods of treatment have been discussed with the patient and family. After consideration of risks, benefits and other options for treatment, the patient has consented to  Procedure(s): COLONOSCOPY WITH PROPOFOL (N/A) as a surgical intervention.  The patient's history has been reviewed, patient examined, no change in status, stable for surgery.  I have reviewed the patient's chart and labs.  Questions were answered to the patient's satisfaction.     Milus Banister

## 2019-04-28 NOTE — Anesthesia Preprocedure Evaluation (Signed)
Anesthesia Evaluation  Patient identified by MRN, date of birth, ID band Patient awake    Reviewed: Allergy & Precautions, NPO status , Patient's Chart, lab work & pertinent test results  History of Anesthesia Complications Negative for: history of anesthetic complications  Airway Mallampati: II  TM Distance: >3 FB Neck ROM: Full    Dental  (+) Dental Advisory Given   Pulmonary shortness of breath, sleep apnea , COPD, former smoker,    Pulmonary exam normal        Cardiovascular hypertension, Pt. on medications + CAD and +CHF  Normal cardiovascular exam+ Valvular Problems/Murmurs   TTE 9/19: - Left ventricle: The cavity size was normal. Systolic function was   vigorous. The estimated ejection fraction was in the range of 65%   to 70%. Wall motion was normal; there were no regional wall   motion abnormalities. Doppler parameters are consistent with   abnormal left ventricular relaxation (grade 1 diastolic   dysfunction). Doppler parameters are consistent with elevated   ventricular end-diastolic filling pressure. - Ventricular septum: The contour showed diastolic flattening and   systolic flattening. - Aortic valve: There was mild regurgitation. - Mitral valve: Calcified annulus. Mildly thickened leaflets . - Left atrium: The atrium was moderately dilated. - Right ventricle: The cavity size was severely dilated. Wall   thickness was normal. Systolic function was mildly reduced. - Right atrium: The atrium was moderately dilated. - Tricuspid valve: There was moderate regurgitation. - Pulmonic valve: There was mild regurgitation. - Pulmonary arteries: Systolic pressure was severely increased. PA   peak pressure: 68 mm Hg (S). - Inferior vena cava: The vessel was normal in size. - Pericardium, extracardiac: There was no pericardial effusion.  Impressions:  - There is no significant difference since the last study on  06/26/2017.   Neuro/Psych Seizures -,     GI/Hepatic GERD  ,(+) Cirrhosis     substance abuse  alcohol use,   Endo/Other  Hypothyroidism   Renal/GU Renal InsufficiencyRenal disease     Musculoskeletal negative musculoskeletal ROS (+)   Abdominal   Peds  Hematology  (+) anemia , Plts 37k, Hgb 8.8   Anesthesia Other Findings Day of surgery medications reviewed with the patient.  Reproductive/Obstetrics                             Anesthesia Physical  Anesthesia Plan  ASA: IV  Anesthesia Plan: MAC   Post-op Pain Management:    Induction:   PONV Risk Score and Plan: Treatment may vary due to age or medical condition and Propofol infusion  Airway Management Planned: Natural Airway and Simple Face Mask  Additional Equipment:   Intra-op Plan:   Post-operative Plan:   Informed Consent: I have reviewed the patients History and Physical, chart, labs and discussed the procedure including the risks, benefits and alternatives for the proposed anesthesia with the patient or authorized representative who has indicated his/her understanding and acceptance.     Dental advisory given  Plan Discussed with: CRNA  Anesthesia Plan Comments:         Anesthesia Quick Evaluation

## 2019-04-29 ENCOUNTER — Encounter (HOSPITAL_COMMUNITY): Payer: Self-pay | Admitting: Gastroenterology

## 2019-05-02 ENCOUNTER — Other Ambulatory Visit: Payer: Self-pay

## 2019-05-05 ENCOUNTER — Ambulatory Visit (INDEPENDENT_AMBULATORY_CARE_PROVIDER_SITE_OTHER): Payer: Medicare Other | Admitting: Family Medicine

## 2019-05-05 ENCOUNTER — Other Ambulatory Visit: Payer: Self-pay

## 2019-05-05 VITALS — BP 129/73 | HR 80 | Temp 98.3°F | Resp 16 | Ht 72.0 in | Wt 213.8 lb

## 2019-05-05 DIAGNOSIS — N183 Chronic kidney disease, stage 3 unspecified: Secondary | ICD-10-CM

## 2019-05-05 DIAGNOSIS — E876 Hypokalemia: Secondary | ICD-10-CM

## 2019-05-05 DIAGNOSIS — E039 Hypothyroidism, unspecified: Secondary | ICD-10-CM | POA: Diagnosis not present

## 2019-05-05 DIAGNOSIS — F1020 Alcohol dependence, uncomplicated: Secondary | ICD-10-CM

## 2019-05-05 DIAGNOSIS — D696 Thrombocytopenia, unspecified: Secondary | ICD-10-CM

## 2019-05-05 DIAGNOSIS — D649 Anemia, unspecified: Secondary | ICD-10-CM

## 2019-05-05 DIAGNOSIS — I50813 Acute on chronic right heart failure: Secondary | ICD-10-CM

## 2019-05-05 DIAGNOSIS — I10 Essential (primary) hypertension: Secondary | ICD-10-CM | POA: Diagnosis not present

## 2019-05-05 LAB — CBC
HCT: 28.5 % — ABNORMAL LOW (ref 39.0–52.0)
Hemoglobin: 9 g/dL — ABNORMAL LOW (ref 13.0–17.0)
MCHC: 31.6 g/dL (ref 30.0–36.0)
MCV: 105.7 fl — ABNORMAL HIGH (ref 78.0–100.0)
Platelets: 30 10*3/uL — CL (ref 150.0–400.0)
RBC: 2.7 Mil/uL — ABNORMAL LOW (ref 4.22–5.81)
RDW: 24 % — ABNORMAL HIGH (ref 11.5–15.5)
WBC: 1.7 10*3/uL — CL (ref 4.0–10.5)

## 2019-05-05 LAB — COMPREHENSIVE METABOLIC PANEL
ALT: 16 U/L (ref 0–53)
AST: 44 U/L — ABNORMAL HIGH (ref 0–37)
Albumin: 3.3 g/dL — ABNORMAL LOW (ref 3.5–5.2)
Alkaline Phosphatase: 285 U/L — ABNORMAL HIGH (ref 39–117)
BUN: 32 mg/dL — ABNORMAL HIGH (ref 6–23)
CO2: 21 mEq/L (ref 19–32)
Calcium: 8.1 mg/dL — ABNORMAL LOW (ref 8.4–10.5)
Chloride: 111 mEq/L (ref 96–112)
Creatinine, Ser: 1.67 mg/dL — ABNORMAL HIGH (ref 0.40–1.50)
GFR: 49.57 mL/min — ABNORMAL LOW (ref >60.00)
Glucose, Bld: 95 mg/dL (ref 70–99)
Potassium: 4.2 mEq/L (ref 3.5–5.1)
Sodium: 139 mEq/L (ref 135–145)
Total Bilirubin: 1.5 mg/dL — ABNORMAL HIGH (ref 0.2–1.2)
Total Protein: 6.6 g/dL (ref 6.0–8.3)

## 2019-05-05 LAB — FERRITIN: Ferritin: 32.8 ng/mL (ref 22.0–322.0)

## 2019-05-05 MED ORDER — METOLAZONE 2.5 MG PO TABS: 1.2500 mg | ORAL_TABLET | Freq: Every day | ORAL | 0 refills | Status: DC | PRN

## 2019-05-05 MED ORDER — THIAMINE HCL 100 MG PO TABS: 100.0000 mg | ORAL_TABLET | Freq: Every day | ORAL | 5 refills | Status: DC

## 2019-05-05 MED ORDER — FOLIC ACID 1 MG PO TABS: 1.0000 mg | ORAL_TABLET | Freq: Every day | ORAL | 5 refills | Status: DC

## 2019-05-05 MED ORDER — FERROUS FUMARATE 324 (106 FE) MG PO TABS: 1.0000 | ORAL_TABLET | Freq: Every day | ORAL | 5 refills | Status: DC

## 2019-05-05 MED FILL — metOLazone 2.5 MG TABS: 2.5 | 20 days supply | Qty: 20 | Fill #0

## 2019-05-05 MED FILL — FERROCITE TABLET: 324 | 30 days supply | Qty: 30 | Fill #0

## 2019-05-05 MED FILL — VITAMIN B-1 100 MG TABLET: 100 | 100 days supply | Qty: 100 | Fill #0

## 2019-05-05 MED FILL — FOLIC ACID 1 MG TABS: 1 | 30 days supply | Qty: 30 | Fill #0

## 2019-05-05 NOTE — Patient Instructions (Signed)
Edema  Edema is an abnormal buildup of fluids in the body tissues and under the skin. Swelling of the legs, feet, and ankles is a common symptom that becomes more likely as you get older. Swelling is also common in looser tissues, like around the eyes. When the affected area is squeezed, the fluid may move out of that spot and leave a dent for a few moments. This dent is called pitting edema. There are many possible causes of edema. Eating too much salt (sodium) and being on your feet or sitting for a long time can cause edema in your legs, feet, and ankles. Hot weather may make edema worse. Common causes of edema include:  Heart failure.  Liver or kidney disease.  Weak leg blood vessels.  Cancer.  An injury.  Pregnancy.  Medicines.  Being obese.  Low protein levels in the blood. Edema is usually painless. Your skin may look swollen or shiny. Follow these instructions at home:  Keep the affected body part raised (elevated) above the level of your heart when you are sitting or lying down.  Do not sit still or stand for long periods of time.  Do not wear tight clothing. Do not wear garters on your upper legs.  Exercise your legs to get your circulation going. This helps to move the fluid back into your blood vessels, and it may help the swelling go down.  Wear elastic bandages or support stockings to reduce swelling as told by your health care provider.  Eat a low-salt (low-sodium) diet to reduce fluid as told by your health care provider.  Depending on the cause of your swelling, you may need to limit how much fluid you drink (fluid restriction).  Take over-the-counter and prescription medicines only as told by your health care provider. Contact a health care provider if:  Your edema does not get better with treatment.  You have heart, liver, or kidney disease and have symptoms of edema.  You have sudden and unexplained weight gain. Get help right away if:  You develop  shortness of breath or chest pain.  You cannot breathe when you lie down.  You develop pain, redness, or warmth in the swollen areas.  You have heart, liver, or kidney disease and suddenly get edema.  You have a fever and your symptoms suddenly get worse. Summary  Edema is an abnormal buildup of fluids in the body tissues and under the skin.  Eating too much salt (sodium) and being on your feet or sitting for a long time can cause edema in your legs, feet, and ankles.  Keep the affected body part raised (elevated) above the level of your heart when you are sitting or lying down. This information is not intended to replace advice given to you by your health care provider. Make sure you discuss any questions you have with your health care provider. Document Released: 10/20/2005 Document Revised: 10/23/2017 Document Reviewed: 11/22/2016 Elsevier Patient Education  2020 [employer].

## 2019-05-08 NOTE — Progress Notes (Signed)
Subjective:    Patient ID: Darren Mueller, male    DOB: 10/16/1951, 69 y.o.   MRN: [id number]  Chief Complaint  Patient presents with   Follow-up    hospital follow up   Leg Swelling    complains of edema mostly on right leg.     HPI Patient is in today for follow up after recent hospitalization for GI bleed. He has not had any further bleeding since returning home. No abdominal pain or fever. Is noting weight gain and increase pedal edema over past week. Denies CP/palp/HA/congestion/fevers/GI or GU c/o. Taking meds as prescribed  Past Medical History:  Diagnosis Date   Alcohol dependence ([place]) 03/22/2011   In remission    Alcohol dependence in remission ([place]) 03/22/2011   In remission    Alcoholic cirrhosis of liver with ascites ([place]) 2014   Anginal pain ([place])    with SOB admitted 04/20/2019   Cardiomegaly 02/2019   Noted on CXR   CHF (congestive heart failure) ([place])    CKD (chronic kidney disease), stage III ([place]) 07/18/2013   COPD (chronic obstructive pulmonary disease) ([place])    Coronary artery disease    Dermatitis 06/10/2015   Diarrhea 09/04/2013   Dyspnea    admitted 04/20/2019   Elbow pain, left 03/17/2017   Eustachian tube dysfunction    GERD (gastroesophageal reflux disease)    Gout    Headache    Hearing loss    Heart murmur    Hx of colonic polyps    Hypertension    Hypertriglyceridemia 03/17/2017   Hypopotassemia    Otalgia 11/28/2013   Other chronic pulmonary heart diseases    PAH (pulmonary artery hypertension) ([place])    RV failure   Pancytopenia ([place])    Pleural effusion 2014   Noted on CT:   Moderately large simple layering right pleural effusion    Preventative health care 06/29/2016   Pulmonary nodule 2014   Noted on CT: An 8 mm pulmonary nodule in the periphery of the left lower lobe   Rectal bleeding    Red eye 03/03/2016   right   Seizures (HCC)    Sleep apnea    does not wear CPAP   Splenomegaly 12/2015   Noted on CT   Thoracic aorta atherosclerosis ([place]) 12/2017   Thrombocytopenia ([place]) 06/29/2016   Unspecified hypothyroidism 01/25/2013   Unspecified pleural effusion    Vertigo    Wears glasses     Past Surgical History:  Procedure Laterality Date   CARDIAC CATHETERIZATION N/A 12/21/2015   Procedure: Right Heart Cath;  Surgeon: Jolaine Artist, MD;  Location: [facility];  Service: Cardiovascular;  Laterality: N/A;   CARDIAC CATHETERIZATION N/A 04/15/2016   Procedure: Right/Left Heart Cath and Coronary Angiography;  Surgeon: Jolaine Artist, MD;  Location: [facility];  Service: Cardiovascular;  Laterality: N/A;   COLONOSCOPY N/A 12/08/2013   Procedure: COLONOSCOPY;  Surgeon: Milus Banister, MD;  Location: [facility];  Service: Endoscopy;  Laterality: N/A;   COLONOSCOPY WITH PROPOFOL N/A 03/24/2019   Procedure: COLONOSCOPY WITH PROPOFOL;  Surgeon: Milus Banister, MD;  Location: [facility];  Service: Endoscopy;  Laterality: N/A;  Draw CBC in preop   COLONOSCOPY WITH PROPOFOL N/A 04/28/2019   Procedure: COLONOSCOPY WITH PROPOFOL;  Surgeon: Milus Banister, MD;  Location: [facility];  Service: Endoscopy;  Laterality: N/A;   ESOPHAGOGASTRODUODENOSCOPY (EGD) WITH PROPOFOL N/A 03/27/2016   Procedure: ESOPHAGOGASTRODUODENOSCOPY (EGD) WITH PROPOFOL;  Surgeon: Melene Plan  Ardis Hughs, MD;  Location: [facility];  Service: Endoscopy;  Laterality: N/A;   ESOPHAGOGASTRODUODENOSCOPY (EGD) WITH PROPOFOL N/A 02/14/2019   Procedure: ESOPHAGOGASTRODUODENOSCOPY (EGD) WITH PROPOFOL;  Surgeon: Jerene Bears, MD;  Location: [facility] END[facility];  Service: Gastroenterology;  Laterality: N/A;   EYE SURGERY Bilateral 03/15/2019   lasix   HEMOSTASIS CLIP PLACEMENT  04/28/2019   Procedure: HEMOSTASIS CLIP PLACEMENT;  Surgeon: Milus Banister, MD;  Location: [facility];  Service: Endoscopy;;   LOOP RECORDER INSERTION N/A 01/01/2017   Procedure: Loop Recorder Insertion;  Surgeon: Thompson Grayer,  MD;  Location: [facility];  Service: Cardiovascular;  Laterality: N/A;   POLYPECTOMY  04/28/2019   Procedure: POLYPECTOMY;  Surgeon: Milus Banister, MD;  Location: [facility];  Service: Endoscopy;;   RIGHT HEART CATHETERIZATION Right 06/06/2014   Procedure: RIGHT HEART CATH;  Surgeon: Jolaine Artist, MD;  Location: [facility] C[facility] LAB;  Service: Cardiovascular;  Laterality: Right;   UVULOPALATOPHARYNGOPLASTY  1999    Family History  Problem Relation Age of Onset   Heart disease Mother    Heart attack Mother    Hypertension Mother    Kidney failure Mother    Liver disease Mother        stage 84 liver disease   Prostate cancer Father    Cancer Father        lung   Hypertension Brother    Gout Brother    Alcoholism Maternal Grandfather    Liver disease Maternal Grandfather    Migraines Sister    Hypertension Brother    Colon cancer Neg Hx     Social History   Socioeconomic History   Marital status: Divorced    Spouse name: Not on file   Number of children: 3   Years of education: Not on file   Highest education level: Not on file  Occupational History   Occupation: Retired    Hydrologist: Research officer, 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 all   Food insecurity    Worry: Never true    Inability: Never true   Transportation needs    Medical: No    Non-medical: No  Tobacco Use   Smoking status: Former Smoker    Packs/day: 2.00    Years: 5.00    Pack years: 10.00    Types: Cigarettes    Quit date: 09/22/1979    Years since quitting: 39.6   Smokeless tobacco: Never Used  Substance and Sexual Activity   Alcohol use: Yes    Alcohol/week: 6.0 standard drinks    Types: 6 Shots of liquor per week    Comment: social drinker   Drug use: No   Sexual activity: Yes    Birth control/protection: Spermicide  Lifestyle   Physical activity    Days per week: 0 days    Minutes per session: 0 min   Stress: Rather much    Relationships   Social connections    Talks on phone: More than three times a week    Gets together: Three times a week    Attends religious service: More than 4 times per year    Active member of club or organization: Yes    Attends meetings of clubs or organizations: More than 4 times per year    Relationship status: Divorced   Intimate partner violence    Fear of current or ex partner: Not on file    Emotionally abused: Not on file    Physically abused:  Not on file    Forced sexual activity: Not on file  Other Topics Concern   Not on file  Social History Narrative   0 caffeine drinks daily     Outpatient Medications Prior to Visit  Medication Sig Dispense Refill   acetaminophen (TYLENOL) 500 MG tablet Take 1,000 mg by mouth every 6 (six) hours as needed (for pain.).     Albuterol Sulfate (PROAIR RESPICLICK) 127 (90 Base) MCG/ACT AEPB Inhale 2 puffs into the lungs every 6 (six) hours as needed (SOB or wheezing). (Patient taking differently: Inhale 2 puffs into the lungs every 6 (six) hours as needed (shortness of breath). ) 3 each 1   allopurinol (ZYLOPRIM) 100 MG tablet Take 2 tablets (200 mg total) by mouth daily. 180 tablet 1   Blood Pressure Monitoring (BLOOD PRESSURE CUFF) MISC Use as directed. Check blood pressure bid prn Dx:I10 1 each 0   colchicine 0.6 MG tablet Take 0.6 mg by mouth daily as needed (gout flare).     diphenoxylate-atropine (LOMOTIL) 2.5-0.025 MG tablet Take 1 tablet by mouth 3 (three) times daily. (Patient taking differently: Take 1 tablet by mouth daily. ) 60 tablet 0   doxepin (SINEQUAN) 10 MG capsule TAKE 1-2 CAPSULES (10-20 MG TOTAL) BY MOUTH AT BEDTIME AS NEEDED. (Patient taking differently: Take 10-20 mg by mouth at bedtime as needed (sleep). ) 60 capsule 5   fluticasone-salmeterol (ADVAIR HFA) 230-21 MCG/ACT inhaler Inhale 2 puffs into the lungs every morning. 48 g 1   furosemide (LASIX) 40 MG tablet Take 1 tablet (40 mg total) by mouth 2  (two) times daily. (Patient taking differently: Take 40 mg by mouth at bedtime. ) 60 tablet 3   hyoscyamine (LEVSIN SL) 0.125 MG SL tablet Place 1 tablet (0.125 mg total) under the tongue every 6 (six) hours as needed. (Patient taking differently: Place 0.125 mg under the tongue daily. ) 90 tablet 3   macitentan (OPSUMIT) 10 MG tablet Take 1 tablet (10 mg total) by mouth daily. 90 tablet 0   metolazone (ZAROXOLYN) 5 MG tablet Take 2.5 mg by mouth at bedtime.     pantoprazole (PROTONIX) 40 MG tablet Take 1 tablet (40 mg total) by mouth daily before breakfast. (Patient not taking: Reported on 04/25/2019) 30 tablet 0   polyethylene glycol-electrolytes (NULYTELY/GOLYTELY) 420 g solution Take 4,000 mLs by mouth as directed. 4000 mL 0   potassium chloride (K-DUR) 10 MEQ tablet Take 2 tablets (20 mEq total) by mouth daily. 60 tablet 3   rosuvastatin (CRESTOR) 5 MG tablet Take 1 tablet (5 mg total) by mouth daily. 90 tablet 3   Selexipag (UPTRAVI) 1000 MCG TABS Take 1,000 mcg by mouth 2 (two) times daily. 60 tablet 11   spironolactone (ALDACTONE) 50 MG tablet TAKE 1 TABLET DAILY (Patient taking differently: Take 50 mg by mouth daily. ) 90 tablet 1   tadalafil, PAH, (ADCIRCA) 20 MG tablet TAKE 2 TABLETS (40 MG TOTAL) DAILY (Patient taking differently: Take 40 mg by mouth daily. ) 60 tablet 12   Ferrous Fumarate (HEMOCYTE) 324 (106 Fe) MG TABS tablet Take 1 tablet (106 mg of iron total) by mouth daily. 30 tablet 5   folic acid (FOLVITE) 1 MG tablet Take 1 tablet (1 mg total) by mouth daily. 30 tablet 5   metolazone (ZAROXOLYN) 2.5 MG tablet Take 1 tablet (2.5 mg total) by mouth daily. (Patient not taking: Reported on 04/25/2019) 20 tablet 0   thiamine 100 MG tablet Take 1 tablet (100 mg  total) by mouth daily. 30 tablet 5   0.9 %  sodium chloride infusion (Manually program via Guardrails IV Fluids)      0.9 %  sodium chloride infusion (Manually program via Guardrails IV Fluids)      0.9 %   sodium chloride infusion (Manually program via Guardrails IV Fluids)      No facility-administered medications prior to visit.     Allergies  Allergen Reactions   Aspirin Hypertension   Nitroglycerin     Headache    Review of Systems  Constitutional: Positive for malaise/fatigue. Negative for fever.  HENT: Negative for congestion.   Eyes: Negative for blurred vision.  Respiratory: Positive for shortness of breath.   Cardiovascular: Positive for leg swelling. Negative for chest pain and palpitations.  Gastrointestinal: Negative for abdominal pain, blood in stool and nausea.  Genitourinary: Negative for dysuria and frequency.  Musculoskeletal: Negative for falls.  Skin: Negative for rash.  Neurological: Negative for dizziness, loss of consciousness and headaches.  Endo/Heme/Allergies: Negative for environmental allergies.  Psychiatric/Behavioral: Negative for depression. The patient is nervous/anxious.        Objective:    Physical Exam  BP 129/73 (BP Location: Right Arm, Patient Position: Sitting, Cuff Size: Small)    Pulse 80    Temp 98.3 F (36.8 C) (Oral)    Resp 16    Ht 6' (1.829 m)    Wt 213 lb 12.8 oz (97 kg)    SpO2 100%    BMI 29.00 kg/m  Wt Readings from Last 3 Encounters:  05/05/19 213 lb 12.8 oz (97 kg)  04/28/19 209 lb (94.8 kg)  04/14/19 203 lb (92.1 kg)    Diabetic Foot Exam - Simple   No data filed     Lab Results  Component Value Date   WBC 1.7 Repeated and verified X2. (LL) 05/05/2019   HGB 9.0 (L) 05/05/2019   HCT 28.5 (L) 05/05/2019   PLT 30.0 Repeated and verified X2. (LL) 05/05/2019   GLUCOSE 95 05/05/2019   CHOL 124 04/13/2019   TRIG 83.0 04/13/2019   HDL 55.90 04/13/2019   LDLDIRECT 34.0 05/04/2018   LDLCALC 51 04/13/2019   ALT 16 05/05/2019   AST 44 (H) 05/05/2019   NA 139 05/05/2019   K 4.2 05/05/2019   CL 111 05/05/2019   CREATININE 1.67 (H) 05/05/2019   BUN 32 (H) 05/05/2019   CO2 21 05/05/2019   TSH 1.96 04/13/2019   PSA  0.49 11/01/2012   INR 1.2 (H) 04/25/2019   HGBA1C 4.8 04/13/2019    Lab Results  Component Value Date   TSH 1.96 04/13/2019   Lab Results  Component Value Date   WBC 1.7 Repeated and verified X2. (LL) 05/05/2019   HGB 9.0 (L) 05/05/2019   HCT 28.5 (L) 05/05/2019   MCV 105.7 (H) 05/05/2019   PLT 30.0 Repeated and verified X2. (LL) 05/05/2019   Lab Results  Component Value Date   NA 139 05/05/2019   K 4.2 05/05/2019   CO2 21 05/05/2019   GLUCOSE 95 05/05/2019   BUN 32 (H) 05/05/2019   CREATININE 1.67 (H) 05/05/2019   BILITOT 1.5 (H) 05/05/2019   ALKPHOS 285 (H) 05/05/2019   AST 44 (H) 05/05/2019   ALT 16 05/05/2019   PROT 6.6 05/05/2019   ALBUMIN 3.3 (L) 05/05/2019   CALCIUM 8.1 (L) 05/05/2019   ANIONGAP 8 03/18/2019   GFR 49.57 (L) 05/05/2019   Lab Results  Component Value Date   CHOL 124 04/13/2019  Lab Results  Component Value Date   HDL 55.90 04/13/2019   Lab Results  Component Value Date   LDLCALC 51 04/13/2019   Lab Results  Component Value Date   TRIG 83.0 04/13/2019   Lab Results  Component Value Date   CHOLHDL 2 04/13/2019   Lab Results  Component Value Date   HGBA1C 4.8 04/13/2019       Assessment & Plan:   Problem List Items Addressed This Visit    Hypertension - Primary (Chronic)    Monitor vitals weekly and report concerns.  no changes to meds. Encouraged heart healthy diet such as the DASH diet and exercise as tolerated.       Relevant Medications   metolazone (ZAROXOLYN) 2.5 MG tablet   Chronic renal insufficiency, stage III (moderate) (HCC) (Chronic)    Continue to monitor and maintain adequate hydration.       Relevant Orders   Comprehensive metabolic panel (Completed)   Hypokalemia   Relevant Orders   Comprehensive metabolic panel (Completed)   Hypothyroidism   Thrombocytopenia (HCC)    Stable secondary to cirrhosis and alcochol use. Will have patient follow up with hematology      Acute on chronic right heart  failure (HCC)    Increased weight and edema, will give short course of Metalazone 2.5 mg 20 minutes prior to am Lasix dose for 3-5 days and then follow up with [place] or cardiology. He will contact cardiology if no improvement. Seek care if worsens over weekend.       Relevant Medications   metolazone (ZAROXOLYN) 2.5 MG tablet   Alcoholism ([place])    He reports he has not drunk alcohol since in the hospital encouraged persistent abstinence.        Other Visit Diagnoses    Anemia, unspecified type       Relevant Medications   Ferrous Fumarate (HEMOCYTE) 324 (106 Fe) MG TABS tablet   folic acid (FOLVITE) 1 MG tablet   Other Relevant Orders   CBC (Completed)   Ferritin (Completed)      I have changed [name] "Col"'s metolazone. I am also having him maintain his spironolactone, allopurinol, tadalafil (PAH), diphenoxylate-atropine, Albuterol Sulfate, fluticasone-salmeterol, hyoscyamine, Selexipag, rosuvastatin, potassium chloride, pantoprazole, Blood Pressure Cuff, furosemide, acetaminophen, doxepin, polyethylene glycol-electrolytes, colchicine, metolazone, Opsumit, Ferrous Fumarate, folic acid, and thiamine. We will stop administering sodium chloride, sodium chloride, and sodium chloride.  Meds ordered this encounter  Medications   metolazone (ZAROXOLYN) 2.5 MG tablet    Sig: Take 0.5-1 tablets (1.25-2.5 mg total) by mouth daily as needed (edema, weight gain>3#,).    Dispense:  20 tablet    Refill:  0   Ferrous Fumarate (HEMOCYTE) 324 (106 Fe) MG TABS tablet    Sig: Take 1 tablet (106 mg of iron total) by mouth daily.    Dispense:  30 tablet    Refill:  5   folic acid (FOLVITE) 1 MG tablet    Sig: Take 1 tablet (1 mg total) by mouth daily.    Dispense:  30 tablet    Refill:  5   thiamine 100 MG tablet    Sig: Take 1 tablet (100 mg total) by mouth daily.    Dispense:  30 tablet    Refill:  5     Penni Homans, MD

## 2019-05-08 NOTE — Assessment & Plan Note (Signed)
He reports he has not drunk alcohol since in the hospital encouraged persistent abstinence.

## 2019-05-08 NOTE — Assessment & Plan Note (Signed)
Monitor vitals weekly and report concerns.  no changes to meds. Encouraged heart healthy diet such as the DASH diet and exercise as tolerated.

## 2019-05-08 NOTE — Assessment & Plan Note (Signed)
Continue to monitor and maintain adequate hydration.

## 2019-05-08 NOTE — Assessment & Plan Note (Signed)
Stable secondary to cirrhosis and alcochol use. Will have patient follow up with hematology

## 2019-05-08 NOTE — Assessment & Plan Note (Signed)
Increased weight and edema, will give short course of Metalazone 2.5 mg 20 minutes prior to am Lasix dose for 3-5 days and then follow up with [place] or cardiology. He will contact cardiology if no improvement. Seek care if worsens over weekend.

## 2019-05-13 ENCOUNTER — Telehealth (HOSPITAL_COMMUNITY): Payer: Self-pay | Admitting: *Deleted

## 2019-05-13 ENCOUNTER — Other Ambulatory Visit: Payer: Self-pay

## 2019-05-13 NOTE — Telephone Encounter (Signed)
Pt called concerned about wt gain and sob.  He states his wt is at 220 and he usually should be around 203.  He states he is sob and has noticed some wheezing with exertion.  He denies any SOB at rest.  He states he just really feels bad and is concerned.  Discussed coming to ER for further eval but pt adamantly refuses.  Sch appt for Mon at 10:30 w/Dr Bensimhon, advised if symptoms worsen over weekend to report to ER

## 2019-05-15 NOTE — Progress Notes (Signed)
Subjective:    Patient ID: Darren Mueller, male    DOB: [birth year], 68 y.o.   MRN: [id number]  HPI  Pt in today for evaluation of recent weight gain and history of chf. On Friday pt called and talked to RN. Below in " phone note comments.   "Pt called concerned about wt gain and sob.  He states his wt is at 220 and he usually should be around 203.  He states he is sob and has noticed some wheezing with exertion.  He denies any SOB at rest.  He states he just really feels bad and is concerned.  Discussed coming to ER for further eval but pt adamantly refuses.  Sch appt for Mon at 10:30 w/Dr Bensimhon, advised if symptoms worsen over weekend to report to ER."  But also scheduled with me earlier.  Pt confirmed he does have appointment with Dr. Haroldine Laws this morning maybe 10 or later.  Most recent recent Echo that I can find completed in epic showed.  Left ventricle:  The cavity size was normal. Systolic function was vigorous. The estimated ejection fraction was in the range of 65% to 70%. Wall motion was normal; there were no regional wall motion abnormalities. Doppler parameters are consistent with abnormal left ventricular relaxation (grade 1 diastolic dysfunction). Doppler parameters are consistent with elevated ventricular end-diastolic filling pressure.  Also hx of alcohol abuse and esophageal varices.  3 months  ago He had minimal bnp mid elevation.  02-13-2019 cxr shows IMPRESSION: 1. Cardiomegaly with mild perihilar vascular congestion without overt pulmonary edema. 2. No other active cardiopulmonary disease.  On April hospital admission troponin was mild elevated it appears thought releated to chf. Probable demand ischemia at that time?  Pt reports progressive recent weight gain, pedal edema and orthopnea.  Pt states he get shortness of breath with acitivity. He states hard to sleep on back due to some shortness of breath.  About 8 lb weight gain over past 11  day.   Review of Systems  Constitutional: Negative for chills, fatigue and fever.  Respiratory: Positive for shortness of breath. Negative for cough, chest tightness and wheezing.   Cardiovascular: Negative for chest pain and palpitations.  Gastrointestinal: Negative for abdominal pain, diarrhea and vomiting.  Genitourinary: Negative for dysuria and frequency.  Musculoskeletal: Negative for back pain and neck pain.  Skin: Negative for rash.  Neurological: Negative for facial asymmetry, speech difficulty, weakness and numbness.  Hematological: Negative for adenopathy. Does not bruise/bleed easily.  Psychiatric/Behavioral: Negative for behavioral problems, confusion and sleep disturbance.    Past Medical History:  Diagnosis Date   Alcohol dependence ([place]) 03/22/2011   In remission    Alcohol dependence in remission ([place]) 03/22/2011   In remission    Alcoholic cirrhosis of liver with ascites ([place]) 2014   Anginal pain ([place])    with SOB admitted 04/20/2019   Cardiomegaly 02/2019   Noted on CXR   CHF (congestive heart failure) ([place])    CKD (chronic kidney disease), stage III ([place]) 07/18/2013   COPD (chronic obstructive pulmonary disease) ([place])    Coronary artery disease    Dermatitis 06/10/2015   Diarrhea 09/04/2013   Dyspnea    admitted 04/20/2019   Elbow pain, left 03/17/2017   Eustachian tube dysfunction    GERD (gastroesophageal reflux disease)    Gout    Headache    Hearing loss    Heart murmur    Hx of colonic polyps    Hypertension  Hypertriglyceridemia 03/17/2017   Hypopotassemia    Otalgia 11/28/2013   Other chronic pulmonary heart diseases    PAH (pulmonary artery hypertension) ([place])    RV failure   Pancytopenia ([place])    Pleural effusion 2014   Noted on CT:   Moderately large simple layering right pleural effusion    Preventative health care 06/29/2016   Pulmonary nodule 2014   Noted on CT: An 8 mm pulmonary nodule in the periphery of the  left lower lobe   Rectal bleeding    Red eye 03/03/2016   right   Seizures ([facility])    Sleep apnea    does not wear CPAP   Splenomegaly 12/2015   Noted on CT   Thoracic aorta atherosclerosis ([place]) 12/2017   Thrombocytopenia ([place]) 06/29/2016   Unspecified hypothyroidism 01/25/2013   Unspecified pleural effusion    Vertigo    Wears glasses      Social History   Socioeconomic History   Marital status: Divorced    Spouse name: Not on file   Number of children: 3   Years of education: Not on file   Highest education level: Not on file  Occupational History   Occupation: Retired    Hydrologist: Research officer, 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 [employer] insecurity    Worry: Never true    Inability: Never true   Transportation needs    Medical: No    Non-medical: No  Tobacco Use   Smoking status: Former Smoker    Packs/day: 2.00    Years: 5.00    Pack years: 10.00    Types: Cigarettes    Quit date: 09/22/1979    Years since quitting: 39.6   Smokeless tobacco: Never Used  Substance and Sexual Activity   Alcohol use: Yes    Alcohol/week: 6.0 standard drinks    Types: 6 Shots of liquor per week    Comment: social drinker   Drug use: No   Sexual activity: Yes    Birth control/protection: Spermicide  Lifestyle   Physical activity    Days per week: 0 days    Minutes per session: 0 min   Stress: Rather much  Relationships   Social connections    Talks on phone: More than three times a week    Gets together: Three times a week    Attends religious service: More than 4 times per year    Active member of club or organization: Yes    Attends meetings of clubs or organizations: More than 4 times per year    Relationship status: Divorced   Intimate partner violence    Fear of current or ex partner: Not on file    Emotionally abused: Not on file    Physically abused: Not on file    Forced sexual activity: Not on file  Other Topics  Concern   Not on file  Social History Narrative   0 caffeine drinks daily     Past Surgical History:  Procedure Laterality Date   CARDIAC CATHETERIZATION N/A 12/21/2015   Procedure: Right Heart Cath;  Surgeon: Jolaine Artist, MD;  Location: [facility];  Service: Cardiovascular;  Laterality: N/A;   CARDIAC CATHETERIZATION N/A 04/15/2016   Procedure: Right/Left Heart Cath and Coronary Angiography;  Surgeon: Jolaine Artist, MD;  Location: [facility];  Service: Cardiovascular;  Laterality: N/A;   COLONOSCOPY N/A 12/08/2013   Procedure: COLONOSCOPY;  Surgeon: Milus Banister,  MD;  Location: [facility];  Service: Endoscopy;  Laterality: N/A;   COLONOSCOPY WITH PROPOFOL N/A 03/24/2019   Procedure: COLONOSCOPY WITH PROPOFOL;  Surgeon: Milus Banister, MD;  Location: [facility];  Service: Endoscopy;  Laterality: N/A;  Draw CBC in preop   COLONOSCOPY WITH PROPOFOL N/A 04/28/2019   Procedure: COLONOSCOPY WITH PROPOFOL;  Surgeon: Milus Banister, MD;  Location: [facility];  Service: Endoscopy;  Laterality: N/A;   ESOPHAGOGASTRODUODENOSCOPY (EGD) WITH PROPOFOL N/A 03/27/2016   Procedure: ESOPHAGOGASTRODUODENOSCOPY (EGD) WITH PROPOFOL;  Surgeon: Milus Banister, MD;  Location: [facility];  Service: Endoscopy;  Laterality: N/A;   ESOPHAGOGASTRODUODENOSCOPY (EGD) WITH PROPOFOL N/A 02/14/2019   Procedure: ESOPHAGOGASTRODUODENOSCOPY (EGD) WITH PROPOFOL;  Surgeon: Jerene Bears, MD;  [facility] END[facility];  Service: Gastroenterology;  Laterality: N/A;   EYE SURGERY Bilateral 03/15/2019   lasix   HEMOSTASIS CLIP PLACEMENT  04/28/2019   Procedure: HEMOSTASIS CLIP PLACEMENT;  Surgeon: Milus Banister, MD;  Location: [facility];  Service: Endoscopy;;   LOOP RECORDER INSERTION N/A 01/01/2017   Procedure: Loop Recorder Insertion;  Surgeon: Thompson Grayer, MD;  Location: [facility];  Service: Cardiovascular;  Laterality: N/A;   POLYPECTOMY  04/28/2019   Procedure:  POLYPECTOMY;  Surgeon: Milus Banister, MD;  Location: [facility];  Service: Endoscopy;;   RIGHT HEART CATHETERIZATION Right 06/06/2014   Procedure: RIGHT HEART CATH;  Surgeon: Jolaine Artist, MD;  Location: [facility] C[facility] LAB;  Service: Cardiovascular;  Laterality: Right;   UVULOPALATOPHARYNGOPLASTY  1999    Family History  Problem Relation Age of Onset   Heart disease Mother    Heart attack Mother    Hypertension Mother    Kidney failure Mother    Liver disease Mother        stage 59 liver disease   Prostate cancer Father    Cancer Father        lung   Hypertension Brother    Gout Brother    Alcoholism Maternal Grandfather    Liver disease Maternal Grandfather    Migraines Sister    Hypertension Brother    Colon cancer Neg Hx     Allergies  Allergen Reactions   Aspirin Hypertension   Nitroglycerin     Headache    Current Outpatient Medications on File Prior to Visit  Medication Sig Dispense Refill   acetaminophen (TYLENOL) 500 MG tablet Take 1,000 mg by mouth every 6 (six) hours as needed (for pain.).     Albuterol Sulfate (PROAIR RESPICLICK) 592 (90 Base) MCG/ACT AEPB Inhale 2 puffs into the lungs every 6 (six) hours as needed (SOB or wheezing). (Patient taking differently: Inhale 2 puffs into the lungs every 6 (six) hours as needed (shortness of breath). ) 3 each 1   allopurinol (ZYLOPRIM) 100 MG tablet Take 2 tablets (200 mg total) by mouth daily. 180 tablet 1   Blood Pressure Monitoring (BLOOD PRESSURE CUFF) MISC Use as directed. Check blood pressure bid prn Dx:I10 1 each 0   colchicine 0.6 MG tablet Take 0.6 mg by mouth daily as needed (gout flare).     diphenoxylate-atropine (LOMOTIL) 2.5-0.025 MG tablet Take 1 tablet by mouth 3 (three) times daily. (Patient taking differently: Take 1 tablet by mouth daily. ) 60 tablet 0   doxepin (SINEQUAN) 10 MG capsule TAKE 1-2 CAPSULES (10-20 MG TOTAL) BY MOUTH AT BEDTIME AS NEEDED. (Patient taking  differently: Take 10-20 mg by mouth at bedtime as needed (sleep). ) 60 capsule 5   Ferrous Fumarate (  HEMOCYTE) 324 (106 Fe) MG TABS tablet Take 1 tablet (106 mg of iron total) by mouth daily. 30 tablet 5   fluticasone-salmeterol (ADVAIR HFA) 230-21 MCG/ACT inhaler Inhale 2 puffs into the lungs every morning. 48 g 1   folic acid (FOLVITE) 1 MG tablet Take 1 tablet (1 mg total) by mouth daily. 30 tablet 5   furosemide (LASIX) 40 MG tablet Take 1 tablet (40 mg total) by mouth 2 (two) times daily. (Patient taking differently: Take 40 mg by mouth at bedtime. ) 60 tablet 3   hyoscyamine (LEVSIN SL) 0.125 MG SL tablet Place 1 tablet (0.125 mg total) under the tongue every 6 (six) hours as needed. (Patient taking differently: Place 0.125 mg under the tongue daily. ) 90 tablet 3   macitentan (OPSUMIT) 10 MG tablet Take 1 tablet (10 mg total) by mouth daily. 90 tablet 0   metolazone (ZAROXOLYN) 2.5 MG tablet Take 0.5-1 tablets (1.25-2.5 mg total) by mouth daily as needed (edema, weight gain>3#,). 20 tablet 0   metolazone (ZAROXOLYN) 5 MG tablet Take 2.5 mg by mouth at bedtime.     pantoprazole (PROTONIX) 40 MG tablet Take 1 tablet (40 mg total) by mouth daily before breakfast. (Patient not taking: Reported on 04/25/2019) 30 tablet 0   polyethylene glycol-electrolytes (NULYTELY/GOLYTELY) 420 g solution Take 4,000 mLs by mouth as directed. 4000 mL 0   potassium chloride (K-DUR) 10 MEQ tablet Take 2 tablets (20 mEq total) by mouth daily. 60 tablet 3   rosuvastatin (CRESTOR) 5 MG tablet Take 1 tablet (5 mg total) by mouth daily. 90 tablet 3   Selexipag (UPTRAVI) 1000 MCG TABS Take 1,000 mcg by mouth 2 (two) times daily. 60 tablet 11   spironolactone (ALDACTONE) 50 MG tablet TAKE 1 TABLET DAILY (Patient taking differently: Take 50 mg by mouth daily. ) 90 tablet 1   tadalafil, PAH, (ADCIRCA) 20 MG tablet TAKE 2 TABLETS (40 MG TOTAL) DAILY (Patient taking differently: Take 40 mg by mouth daily. ) 60  tablet 12   thiamine 100 MG tablet Take 1 tablet (100 mg total) by mouth daily. 30 tablet 5   No current facility-administered medications on file prior to visit.     There were no vitals taken for this visit.      Objective:   Physical Exam  General Mental Status- Alert. General Appearance- Not in acute distress.   Skin General: Color- Normal Color. Moisture- Normal Moisture.  Neck Carotid Arteries- Normal color. Moisture- Normal Moisture. No carotid bruits. No JVD.  Chest and Lung Exam Auscultation: Breath Sounds:-Normal.  Cardiovascular Auscultation:Rythm- Regular. Murmurs & Other Heart Sounds:Auscultation of the heart reveals- No Murmurs.  Abdomen Inspection:-Inspeection Normal. Palpation/Percussion:Note:No mass. Palpation and Percussion of the abdomen reveal- Non Tender, Non Distended + BS, no rebound or guarding.   Neurologic Cranial Nerve exam:- CN III-XII intact(No nystagmus), symmetric smile. Strength:- 5/5 equal and symmetric strength both upper and lower extremities.  Lower ext- 2-3+ pedal edema feet to calfs both sides.. Negative homans sign.      Assessment & Plan:    With your recent severe weight gain, pedal edema and history of chf, I have concern for chf exacerbation. Initially thought  ED safest and quickest way to evaluate and treat your condition. I understand you declined ED evaluation over weekend due to covid concern. .   I am glad you have appointment with Dr. Haroldine Laws later today then you could wait and get his opinion. He may recommend ED evaluation as well. I  will go ahead and place chest xray, cbc,  cmp, bnp and troponin stat. That way labs will be available by time of appointment.  Follow up with [place] date to be determined after further evaluation by Cardiologist today.  25 + minutes spent with pt.  Mackie Pai, PA-C

## 2019-05-15 NOTE — Patient Instructions (Addendum)
With your recent severe weight gain, pedal edema and history of chf, I have concern for chf exacerbation. Initially thought  ED safest and quickest way to evaluate and treat your condition. I understand you declined ED evaluation over weekend due to covid concern. .   I am glad you have appointment with Dr. Haroldine Laws later today then you could wait and get his opinion. He may recommend ED evaluation as well. I will go ahead and place chest xray, cmp,  Cbc, bnp and troponin stat. That way labs will be available by time of appointment.  Follow up with [place] date to be determined after further evaluation by Cardiologist today.

## 2019-05-16 ENCOUNTER — Inpatient Hospital Stay (HOSPITAL_COMMUNITY): Payer: Medicare Other

## 2019-05-16 ENCOUNTER — Telehealth: Payer: Self-pay | Admitting: *Deleted

## 2019-05-16 ENCOUNTER — Inpatient Hospital Stay (HOSPITAL_BASED_OUTPATIENT_CLINIC_OR_DEPARTMENT_OTHER)
Admission: AD | Admit: 2019-05-16 | Discharge: 2019-05-19 | DRG: 291 | Disposition: A | Discharge status: Home / Self Care | Payer: Medicare Other | Source: Ambulatory Visit | Attending: Internal Medicine | Admitting: Internal Medicine

## 2019-05-16 ENCOUNTER — Ambulatory Visit (HOSPITAL_BASED_OUTPATIENT_CLINIC_OR_DEPARTMENT_OTHER)
Admission: RE | Admit: 2019-05-16 | Discharge: 2019-05-16 | Disposition: A | Discharge status: Home / Self Care | Payer: Medicare Other | Source: Ambulatory Visit | Attending: Internal Medicine | Admitting: Internal Medicine

## 2019-05-16 ENCOUNTER — Ambulatory Visit (INDEPENDENT_AMBULATORY_CARE_PROVIDER_SITE_OTHER): Payer: Medicare Other | Admitting: Medical

## 2019-05-16 ENCOUNTER — Encounter: Payer: Self-pay | Admitting: Medical

## 2019-05-16 ENCOUNTER — Encounter (HOSPITAL_COMMUNITY): Payer: Self-pay | Admitting: Internal Medicine

## 2019-05-16 ENCOUNTER — Other Ambulatory Visit: Payer: Self-pay

## 2019-05-16 ENCOUNTER — Encounter (HOSPITAL_COMMUNITY): Payer: Self-pay | Admitting: *Deleted

## 2019-05-16 ENCOUNTER — Ambulatory Visit (HOSPITAL_BASED_OUTPATIENT_CLINIC_OR_DEPARTMENT_OTHER)
Admission: RE | Admit: 2019-05-16 | Discharge: 2019-05-16 | Disposition: A | Discharge status: Home / Self Care | Payer: Medicare Other | Source: Ambulatory Visit | Attending: Medical | Admitting: Medical

## 2019-05-16 VITALS — BP 144/88 | HR 92 | Wt 220.2 lb

## 2019-05-16 VITALS — BP 133/80 | HR 95 | Temp 97.8°F | Resp 16 | Ht 72.0 in | Wt 219.2 lb

## 2019-05-16 DIAGNOSIS — I130 Hypertensive heart and chronic kidney disease with heart failure and stage 1 through stage 4 chronic kidney disease, or unspecified chronic kidney disease: Secondary | ICD-10-CM | POA: Diagnosis present

## 2019-05-16 DIAGNOSIS — F1021 Alcohol dependence, in remission: Secondary | ICD-10-CM | POA: Diagnosis present

## 2019-05-16 DIAGNOSIS — K766 Portal hypertension: Secondary | ICD-10-CM

## 2019-05-16 DIAGNOSIS — N183 Chronic kidney disease, stage 3 (moderate): Secondary | ICD-10-CM | POA: Diagnosis present

## 2019-05-16 DIAGNOSIS — I5033 Acute on chronic diastolic (congestive) heart failure: Secondary | ICD-10-CM | POA: Diagnosis present

## 2019-05-16 DIAGNOSIS — I2789 Other specified pulmonary heart diseases: Secondary | ICD-10-CM | POA: Diagnosis present

## 2019-05-16 DIAGNOSIS — Z79899 Other long term (current) drug therapy: Secondary | ICD-10-CM

## 2019-05-16 DIAGNOSIS — I2729 Other secondary pulmonary hypertension: Secondary | ICD-10-CM | POA: Diagnosis present

## 2019-05-16 DIAGNOSIS — I2510 Atherosclerotic heart disease of native coronary artery without angina pectoris: Secondary | ICD-10-CM | POA: Diagnosis present

## 2019-05-16 DIAGNOSIS — G4733 Obstructive sleep apnea (adult) (pediatric): Secondary | ICD-10-CM | POA: Diagnosis present

## 2019-05-16 DIAGNOSIS — D61818 Other pancytopenia: Secondary | ICD-10-CM | POA: Diagnosis present

## 2019-05-16 DIAGNOSIS — F17210 Nicotine dependence, cigarettes, uncomplicated: Secondary | ICD-10-CM | POA: Diagnosis not present

## 2019-05-16 DIAGNOSIS — E781 Pure hyperglyceridemia: Secondary | ICD-10-CM | POA: Diagnosis present

## 2019-05-16 DIAGNOSIS — I700 Atherosclerosis of aorta: Secondary | ICD-10-CM | POA: Diagnosis present

## 2019-05-16 DIAGNOSIS — J449 Chronic obstructive pulmonary disease, unspecified: Secondary | ICD-10-CM | POA: Diagnosis present

## 2019-05-16 DIAGNOSIS — K3189 Other diseases of stomach and duodenum: Secondary | ICD-10-CM | POA: Diagnosis present

## 2019-05-16 DIAGNOSIS — E039 Hypothyroidism, unspecified: Secondary | ICD-10-CM | POA: Diagnosis present

## 2019-05-16 DIAGNOSIS — E876 Hypokalemia: Secondary | ICD-10-CM | POA: Diagnosis not present

## 2019-05-16 DIAGNOSIS — I50812 Chronic right heart failure: Secondary | ICD-10-CM

## 2019-05-16 DIAGNOSIS — I509 Heart failure, unspecified: Secondary | ICD-10-CM

## 2019-05-16 DIAGNOSIS — I50810 Right heart failure, unspecified: Secondary | ICD-10-CM | POA: Diagnosis not present

## 2019-05-16 DIAGNOSIS — R635 Abnormal weight gain: Secondary | ICD-10-CM

## 2019-05-16 DIAGNOSIS — Z87891 Personal history of nicotine dependence: Secondary | ICD-10-CM

## 2019-05-16 DIAGNOSIS — R569 Unspecified convulsions: Secondary | ICD-10-CM | POA: Diagnosis present

## 2019-05-16 DIAGNOSIS — R0600 Dyspnea, unspecified: Secondary | ICD-10-CM

## 2019-05-16 DIAGNOSIS — Z8379 Family history of other diseases of the digestive system: Secondary | ICD-10-CM

## 2019-05-16 DIAGNOSIS — Z1159 Encounter for screening for other viral diseases: Secondary | ICD-10-CM

## 2019-05-16 DIAGNOSIS — K7030 Alcoholic cirrhosis of liver without ascites: Secondary | ICD-10-CM | POA: Diagnosis present

## 2019-05-16 DIAGNOSIS — Z888 Allergy status to other drugs, medicaments and biological substances status: Secondary | ICD-10-CM

## 2019-05-16 DIAGNOSIS — I2721 Secondary pulmonary arterial hypertension: Secondary | ICD-10-CM

## 2019-05-16 DIAGNOSIS — Z8042 Family history of malignant neoplasm of prostate: Secondary | ICD-10-CM

## 2019-05-16 DIAGNOSIS — R197 Diarrhea, unspecified: Secondary | ICD-10-CM | POA: Diagnosis not present

## 2019-05-16 DIAGNOSIS — Z973 Presence of spectacles and contact lenses: Secondary | ICD-10-CM

## 2019-05-16 DIAGNOSIS — Z801 Family history of malignant neoplasm of trachea, bronchus and lung: Secondary | ICD-10-CM

## 2019-05-16 DIAGNOSIS — R0602 Shortness of breath: Secondary | ICD-10-CM | POA: Diagnosis not present

## 2019-05-16 DIAGNOSIS — Z841 Family history of disorders of kidney and ureter: Secondary | ICD-10-CM

## 2019-05-16 DIAGNOSIS — I5082 Biventricular heart failure: Secondary | ICD-10-CM | POA: Diagnosis present

## 2019-05-16 DIAGNOSIS — D649 Anemia, unspecified: Secondary | ICD-10-CM | POA: Diagnosis not present

## 2019-05-16 DIAGNOSIS — I351 Nonrheumatic aortic (valve) insufficiency: Secondary | ICD-10-CM | POA: Diagnosis not present

## 2019-05-16 DIAGNOSIS — K219 Gastro-esophageal reflux disease without esophagitis: Secondary | ICD-10-CM | POA: Diagnosis present

## 2019-05-16 DIAGNOSIS — I5032 Chronic diastolic (congestive) heart failure: Secondary | ICD-10-CM | POA: Diagnosis present

## 2019-05-16 DIAGNOSIS — I8510 Secondary esophageal varices without bleeding: Secondary | ICD-10-CM | POA: Diagnosis present

## 2019-05-16 DIAGNOSIS — J984 Other disorders of lung: Secondary | ICD-10-CM | POA: Diagnosis not present

## 2019-05-16 DIAGNOSIS — I361 Nonrheumatic tricuspid (valve) insufficiency: Secondary | ICD-10-CM | POA: Diagnosis not present

## 2019-05-16 DIAGNOSIS — Z7951 Long term (current) use of inhaled steroids: Secondary | ICD-10-CM

## 2019-05-16 DIAGNOSIS — Z811 Family history of alcohol abuse and dependence: Secondary | ICD-10-CM

## 2019-05-16 DIAGNOSIS — R55 Syncope and collapse: Secondary | ICD-10-CM | POA: Diagnosis not present

## 2019-05-16 DIAGNOSIS — Z886 Allergy status to analgesic agent status: Secondary | ICD-10-CM

## 2019-05-16 DIAGNOSIS — M109 Gout, unspecified: Secondary | ICD-10-CM | POA: Diagnosis present

## 2019-05-16 DIAGNOSIS — Z8249 Family history of ischemic heart disease and other diseases of the circulatory system: Secondary | ICD-10-CM

## 2019-05-16 LAB — CBC
HCT: 31.3 % — ABNORMAL LOW (ref 39.0–52.0)
Hemoglobin: 9.6 g/dL — ABNORMAL LOW (ref 13.0–17.0)
MCH: 33 pg (ref 26.0–34.0)
MCHC: 30.7 g/dL (ref 30.0–36.0)
MCV: 107.6 fL — ABNORMAL HIGH (ref 80.0–100.0)
Platelets: 39 10*3/uL — ABNORMAL LOW (ref 150–400)
RBC: 2.91 MIL/uL — ABNORMAL LOW (ref 4.22–5.81)
RDW: 20.9 % — ABNORMAL HIGH (ref 11.5–15.5)
WBC: 1.8 10*3/uL — ABNORMAL LOW (ref 4.0–10.5)
nRBC: 0 % (ref 0.0–0.2)

## 2019-05-16 LAB — COMPREHENSIVE METABOLIC PANEL
ALT: 14 U/L (ref 0–53)
ALT: 20 U/L (ref 0–44)
AST: 37 U/L (ref 0–37)
AST: 45 U/L — ABNORMAL HIGH (ref 15–41)
Albumin: 3.3 g/dL — ABNORMAL LOW (ref 3.5–5.2)
Albumin: 3.1 g/dL — ABNORMAL LOW (ref 3.5–5.0)
Alkaline Phosphatase: 257 U/L — ABNORMAL HIGH (ref 39–117)
Alkaline Phosphatase: 261 U/L — ABNORMAL HIGH (ref 38–126)
Anion gap: 9 (ref 5–15)
BUN: 28 mg/dL — ABNORMAL HIGH (ref 6–23)
BUN: 26 mg/dL — ABNORMAL HIGH (ref 8–23)
CO2: 19 mEq/L (ref 19–32)
CO2: 16 mmol/L — ABNORMAL LOW (ref 22–32)
Calcium: 8.3 mg/dL — ABNORMAL LOW (ref 8.4–10.5)
Calcium: 8.7 mg/dL — ABNORMAL LOW (ref 8.9–10.3)
Chloride: 109 mEq/L (ref 96–112)
Chloride: 113 mmol/L — ABNORMAL HIGH (ref 98–111)
Creatinine, Ser: 1.79 mg/dL — ABNORMAL HIGH (ref 0.40–1.50)
Creatinine, Ser: 1.91 mg/dL — ABNORMAL HIGH (ref 0.61–1.24)
GFR calc Af Amer: 41 mL/min — ABNORMAL LOW (ref >60)
GFR calc non Af Amer: 35 mL/min — ABNORMAL LOW (ref >60)
GFR: 45.75 mL/min — ABNORMAL LOW (ref >60.00)
Glucose, Bld: 88 mg/dL (ref 70–99)
Glucose, Bld: 90 mg/dL (ref 70–99)
Potassium: 3.7 mEq/L (ref 3.5–5.1)
Potassium: 3.8 mmol/L (ref 3.5–5.1)
Sodium: 137 mEq/L (ref 135–145)
Sodium: 138 mmol/L (ref 135–145)
Total Bilirubin: 1.4 mg/dL — ABNORMAL HIGH (ref 0.2–1.2)
Total Bilirubin: 1.7 mg/dL — ABNORMAL HIGH (ref 0.3–1.2)
Total Protein: 6.8 g/dL (ref 6.0–8.3)
Total Protein: 7 g/dL (ref 6.5–8.1)

## 2019-05-16 LAB — CBC WITH DIFFERENTIAL/PLATELET
Basophils Absolute: 0 10*3/uL (ref 0.0–0.1)
Basophils Relative: 0.3 % (ref 0.0–3.0)
Eosinophils Absolute: 0.1 10*3/uL (ref 0.0–0.7)
Eosinophils Relative: 3.2 % (ref 0.0–5.0)
HCT: 29.5 % — ABNORMAL LOW (ref 39.0–52.0)
Hemoglobin: 9.5 g/dL — ABNORMAL LOW (ref 13.0–17.0)
Lymphocytes Relative: 28.3 % (ref 12.0–46.0)
Lymphs Abs: 0.5 10*3/uL — ABNORMAL LOW (ref 0.7–4.0)
MCHC: 32.2 g/dL (ref 30.0–36.0)
MCV: 104.1 fl — ABNORMAL HIGH (ref 78.0–100.0)
Monocytes Absolute: 0.2 10*3/uL (ref 0.1–1.0)
Monocytes Relative: 12.5 % — ABNORMAL HIGH (ref 3.0–12.0)
Neutro Abs: 1 10*3/uL — ABNORMAL LOW (ref 1.4–7.7)
Neutrophils Relative %: 55.7 % (ref 43.0–77.0)
Platelets: 33 10*3/uL — CL (ref 150.0–400.0)
RBC: 2.84 Mil/uL — ABNORMAL LOW (ref 4.22–5.81)
RDW: 21.4 % — ABNORMAL HIGH (ref 11.5–15.5)
WBC: 1.8 10*3/uL — CL (ref 4.0–10.5)

## 2019-05-16 LAB — IRON: Iron: 112 ug/dL (ref 42–165)

## 2019-05-16 LAB — MRSA PCR SCREENING: MRSA by PCR: NEGATIVE

## 2019-05-16 LAB — PROTIME-INR
INR: 1.2 (ref 0.8–1.2)
Prothrombin Time: 15.2 seconds (ref 11.4–15.2)

## 2019-05-16 LAB — BRAIN NATRIURETIC PEPTIDE
B Natriuretic Peptide: 1119.2 pg/mL — ABNORMAL HIGH (ref 0.0–100.0)
Pro B Natriuretic peptide (BNP): 1293 pg/mL — ABNORMAL HIGH (ref 0.0–100.0)

## 2019-05-16 LAB — MAGNESIUM: Magnesium: 1.9 mg/dL (ref 1.7–2.4)

## 2019-05-16 LAB — SARS CORONAVIRUS 2 BY RT PCR (HOSPITAL ORDER, PERFORMED IN ~~LOC~~ HOSPITAL LAB): SARS Coronavirus 2: NEGATIVE

## 2019-05-16 LAB — TROPONIN I: TNIDX: 0.03 ug/l (ref 0.00–0.06)

## 2019-05-16 MED ORDER — ROSUVASTATIN CALCIUM 5 MG PO TABS
5.0000 mg | ORAL_TABLET | Freq: Every day | ORAL | Status: DC
  Administered 2019-05-16 – 2019-05-19 (×4): 5 mg via ORAL
  Filled 2019-05-16 (×4): qty 1

## 2019-05-16 MED ORDER — FOLIC ACID 1 MG PO TABS
1.0000 mg | ORAL_TABLET | Freq: Every day | ORAL | Status: DC
  Administered 2019-05-16 – 2019-05-19 (×4): 1 mg via ORAL
  Filled 2019-05-16 (×4): qty 1

## 2019-05-16 MED ORDER — SODIUM CHLORIDE 0.9% FLUSH
3.0000 mL | Freq: Two times a day (BID) | INTRAVENOUS | Status: DC
  Administered 2019-05-16 – 2019-05-19 (×5): 3 mL via INTRAVENOUS

## 2019-05-16 MED ORDER — MOMETASONE FURO-FORMOTEROL FUM 200-5 MCG/ACT IN AERO
2.0000 | INHALATION_SPRAY | Freq: Two times a day (BID) | RESPIRATORY_TRACT | Status: DC
  Administered 2019-05-17 – 2019-05-19 (×5): 2 via RESPIRATORY_TRACT
  Filled 2019-05-16: qty 8.8

## 2019-05-16 MED ORDER — FERROUS FUMARATE 324 (106 FE) MG PO TABS
1.0000 | ORAL_TABLET | Freq: Every day | ORAL | Status: DC
  Administered 2019-05-17 – 2019-05-19 (×3): 106 mg via ORAL
  Filled 2019-05-16 (×3): qty 1

## 2019-05-16 MED ORDER — FUROSEMIDE 10 MG/ML IJ SOLN
80.0000 mg | Freq: Two times a day (BID) | INTRAMUSCULAR | Status: DC
  Administered 2019-05-16 – 2019-05-18 (×4): 80 mg via INTRAVENOUS
  Filled 2019-05-16 (×4): qty 8

## 2019-05-16 MED ORDER — TADALAFIL (PAH) 20 MG PO TABS
40.0000 mg | ORAL_TABLET | Freq: Every day | ORAL | Status: DC
  Administered 2019-05-17 – 2019-05-19 (×3): 40 mg via ORAL
  Filled 2019-05-16 (×4): qty 2

## 2019-05-16 MED ORDER — ENOXAPARIN SODIUM 40 MG/0.4ML ~~LOC~~ SOLN
40.0000 mg | SUBCUTANEOUS | Status: DC
  Administered 2019-05-17 – 2019-05-19 (×3): 40 mg via SUBCUTANEOUS
  Filled 2019-05-16 (×3): qty 0.4

## 2019-05-16 MED ORDER — HYOSCYAMINE SULFATE 0.125 MG SL SUBL
0.1250 mg | SUBLINGUAL_TABLET | Freq: Every day | SUBLINGUAL | Status: DC
  Administered 2019-05-17 – 2019-05-19 (×3): 0.125 mg via SUBLINGUAL
  Filled 2019-05-16 (×3): qty 1

## 2019-05-16 MED ORDER — SODIUM CHLORIDE 0.9% FLUSH: 3.0000 mL | INTRAVENOUS | Status: DC | PRN

## 2019-05-16 MED ORDER — ALBUTEROL SULFATE (2.5 MG/3ML) 0.083% IN NEBU: 2.5000 mg | INHALATION_SOLUTION | Freq: Four times a day (QID) | RESPIRATORY_TRACT | Status: DC | PRN

## 2019-05-16 MED ORDER — ALLOPURINOL 100 MG PO TABS
200.0000 mg | ORAL_TABLET | Freq: Every day | ORAL | Status: DC
  Administered 2019-05-17 – 2019-05-19 (×3): 200 mg via ORAL
  Filled 2019-05-16 (×4): qty 2

## 2019-05-16 MED ORDER — FUROSEMIDE 10 MG/ML IJ SOLN
80.0000 mg | Freq: Once | INTRAMUSCULAR | Status: AC
  Administered 2019-05-16: 80 mg via INTRAVENOUS
  Filled 2019-05-16 (×2): qty 8

## 2019-05-16 MED ORDER — SODIUM CHLORIDE 0.9 % IV SOLN: 250.0000 mL | INTRAVENOUS | Status: DC | PRN

## 2019-05-16 MED ORDER — SPIRONOLACTONE 25 MG PO TABS
50.0000 mg | ORAL_TABLET | Freq: Every day | ORAL | Status: DC
  Administered 2019-05-17 – 2019-05-19 (×3): 50 mg via ORAL
  Filled 2019-05-16 (×4): qty 2

## 2019-05-16 MED ORDER — SELEXIPAG 1000 MCG PO TABS
1000.0000 ug | ORAL_TABLET | Freq: Two times a day (BID) | ORAL | Status: DC
  Administered 2019-05-17 – 2019-05-19 (×5): 1000 ug via ORAL
  Filled 2019-05-16 (×5): qty 1

## 2019-05-16 MED ORDER — PANTOPRAZOLE SODIUM 40 MG PO TBEC
40.0000 mg | DELAYED_RELEASE_TABLET | Freq: Every day | ORAL | Status: DC
  Administered 2019-05-18 – 2019-05-19 (×2): 40 mg via ORAL
  Filled 2019-05-16 (×2): qty 1

## 2019-05-16 MED ORDER — VITAMIN B-1 100 MG PO TABS
100.0000 mg | ORAL_TABLET | Freq: Every day | ORAL | Status: DC
  Administered 2019-05-16 – 2019-05-19 (×4): 100 mg via ORAL
  Filled 2019-05-16 (×4): qty 1

## 2019-05-16 MED ORDER — ALBUTEROL SULFATE 108 (90 BASE) MCG/ACT IN AEPB: 2.0000 | INHALATION_SPRAY | Freq: Four times a day (QID) | RESPIRATORY_TRACT | Status: DC | PRN

## 2019-05-16 MED ORDER — ONDANSETRON HCL 4 MG/2ML IJ SOLN: 4.0000 mg | Freq: Four times a day (QID) | INTRAMUSCULAR | Status: DC | PRN

## 2019-05-16 MED ORDER — MACITENTAN 10 MG PO TABS
10.0000 mg | ORAL_TABLET | Freq: Every day | ORAL | Status: DC
  Administered 2019-05-17 – 2019-05-19 (×3): 10 mg via ORAL
  Filled 2019-05-16 (×4): qty 1

## 2019-05-16 MED ORDER — ACETAMINOPHEN 325 MG PO TABS
650.0000 mg | ORAL_TABLET | ORAL | Status: DC | PRN
  Administered 2019-05-18 – 2019-05-19 (×2): 650 mg via ORAL
  Filled 2019-05-16 (×2): qty 2

## 2019-05-16 NOTE — H&P (Signed)
Advanced Heart Failure Team History and Physical Note   PCP:  Mosie Lukes, MD  PCP-Cardiology: No primary care provider on file.     Reason for Admission:  Acute on chronic diastolic HF   HPI:    Darren Mueller is a 68 y.o. male with a history of ("the [place]") with history of COPD, diastolic HF, portopulmonary hypertension with RV failure, and ETOH cirrhosis.  Admitted in 6/17 with CP and SOB. Was diuresed. Troponins normal. Underwent R/L cath. Minimal CAD with moderate PAH and preserved cardiac output.   Admitted 06/2016 and 07/2016 with syncope. On September admission found to have elevated ETOH level as well as R 6th rib fracture. Wore 30 day monitor up until 08/28/16. No AF noted. No events. Admitted February2018after syncopal event. LINQ placed.   Was admitted4/20with hematochezia and melena. His hemoglobin remained stable. No episodes of GI bleed after hospitalization. Underwent EGD by GI with finding of Grade 1 and a small esophageal varices, no evidence of recent bleeding,portal hypertensive gastropathy. Started on nadolol which made him very dizzy. He stopped this and feels a lot   PAH meds 1) Macitnentan 10 mg daily 2) Adcirca 40 mg daily  3) Selexipeg 1000/1200 daily  Presents today for sick visit. Weight up nearly 20 pounds. SOB at rest and with any activity. Says appetite very poor but eating a lot of icies. + edema, orthopnea and PND. Says he is taking lasix but not responding. No f/c.    Studies: ECHO 12/14 EF 55-60% Peak PA pressure 35. Severe RV dysfunction  ECHO 7/15 EF 60% RV moderately to severely dilated. Moderate HK RVSP 33m HG  ECHO 6/16 EF 60% RV moderately to severely dilated. Severe HK RVSP ~65 mm HG. D-shaped septum Echo 2/17 LVEF 60-65% RV massively dilated. Flat septum. Severe HK. Moderate TR RVSP ~65. IVC small. No effusion  ECHO 01/01/2017 EF 50-55% Grade I DD. RV severely dilated. Peak PA pressure 62 mm hg Echo 9/19 with EF  60-65% RV markedly dilated and hypokinetic with D-shaped septum. RVSP 629mG  Cath 6/17 Mid RCA lesion, 20% stenosed. Dist LAD lesion, 20% stenosed. Ao = 107/70 (87) LV = 106/3/11 RA = 4 RV = 74/11 PA = 74/26 (45) PCW = 6 Fick cardiac output/index = 4.6.2.2 PVR = 8.5 Ao sat = 95% PA sat = 61%, 58%  PFTs 1/17 FEV1 2.05 L (61%) FVC 2.48L (56%) DLCO 44%   Ab u/s 8/16: + cirrhosis/mild to moderate splenomegaly  6 min walk 01/10/13, 1290 feet  6MW (01/10/14) = 1280 feet (380 m) 6MW (9/15) = 1350 feet (411 m) O2 sats ranged from 89-95% on room air, HR ranged from 100-129. 6MW (6/16) = 384 meters  6MW (2/17) = 1250 feet (38167mMW (8/17) = 1320 feet     Review of Systems: [y] = yes, _0  = no   General: Weight gain [y Blue.Reese Weight loss _1 ; Anorexia [y Blue.Reese Fatigue [y Blue.Reese Fever _2 ; Chills _3 ; Weakness [y Blue.Reese Cardiac: Chest pain/pressure _4 ; Resting SOB [y Blue.Reese Exertional SOB [y Blue.Reese Orthopnea [y Blue.Reese Pedal Edema [ y]; Palpitations _5 ; Syncope _6 ; Presyncope _7 ; Paroxysmal nocturnal dyspnea[y ]  Pulmonary: Cough [y Blue.Reese Wheezing_8 ; Hemoptysis_9 ; Sputum _10 ; Snoring _11   GI: Vomiting_12 ; Dysphagia_13 ; Melena_14 ; Hematochezia _15 ; Heartburn_16 ; Abdominal pain _17 ; Constipation _18 ; Diarrhea _19 ; BRBPR _20   GU: Hematuria_21 ; Dysuria _22 ; Nocturia_23   Vascular: Pain in legs with walking _0 ; Pain in feet with lying flat _1 ; Non-healing sores _2 ; Stroke _3 ; TIA _4 ; Slurred speech _5 ;  Neuro: Headaches_6 ; Vertigo_7 ; Seizures_8 ; Paresthesias_9 ;Blurred vision _10 ; Diplopia _11 ; Vision changes _12   Ortho/Skin: Arthritis [y]; Joint pain Blue.Reese ]; Muscle pain _13 ; Joint swelling _14 ; Back Pain _15 ; Rash _16   Psych: Depression[ y]; Anxiety_17   Heme: Bleeding problems _18 ; Clotting disorders _19 ; Anemia _20   Endocrine: Diabetes _21 ; Thyroid dysfunction_22    Home Medications Prior to Admission medications   Medication Sig Start Date End Date Taking? Authorizing Provider   acetaminophen (TYLENOL) 500 MG tablet Take 1,000 mg by mouth every 6 (six) hours as needed (for pain.).    [provider]  Albuterol Sulfate (PROAIR RESPICLICK) 916 (90 Base) MCG/ACT AEPB Inhale 2 puffs into the lungs every 6 (six) hours as needed (SOB or wheezing). Patient taking differently: Inhale 2 puffs into the lungs every 6 (six) hours as needed (shortness of breath).  09/10/18   Mosie Lukes, MD  allopurinol (ZYLOPRIM) 100 MG tablet Take 2 tablets (200 mg total) by mouth daily. 05/04/18   Mosie Lukes, MD  Blood Pressure Monitoring (BLOOD PRESSURE CUFF) MISC Use as directed. Check blood pressure bid prn Dx:I10 02/22/19   Mosie Lukes, MD  colchicine 0.6 MG tablet Take 0.6 mg by mouth daily as needed (gout flare).    [provider]  diphenoxylate-atropine (LOMOTIL) 2.5-0.025 MG tablet Take 1 tablet by mouth 3 (three) times daily. Patient taking differently: Take 1 tablet by mouth daily.  08/26/18   Mosie Lukes, MD  doxepin (SINEQUAN) 10 MG capsule TAKE 1-2 CAPSULES (10-20 MG TOTAL) BY MOUTH AT BEDTIME AS NEEDED. Patient taking differently: Take 10-20 mg by mouth at bedtime as needed (sleep).  04/18/19   Mosie Lukes, MD  Ferrous Fumarate (HEMOCYTE) 324 (106 Fe) MG TABS tablet Take 1 tablet (106 mg of iron total) by mouth daily. 05/05/19   Mosie Lukes, MD  fluticasone-salmeterol (ADVAIR HFA) [phone] MCG/ACT inhaler Inhale 2 puffs into the lungs every morning. 09/10/18   Mosie Lukes, MD  folic acid (FOLVITE) 1 MG tablet Take 1 tablet (1 mg total) by mouth daily. 05/05/19   Mosie Lukes, MD  furosemide (LASIX) 40 MG tablet Take 1 tablet (40 mg total) by mouth 2 (two) times daily. Patient taking differently: Take 40 mg by mouth at bedtime.  03/14/19   Takima Encina, Shaune Pascal, MD  hyoscyamine (LEVSIN SL) 0.125 MG SL tablet Place 1 tablet (0.125 mg total) under the tongue every 6 (six) hours as needed. Patient taking differently: Place 0.125 mg under the tongue daily.   09/24/18   Mosie Lukes, MD  macitentan (OPSUMIT) 10 MG tablet Take 1 tablet (10 mg total) by mouth daily. 04/26/19   Lafawn Lenoir, Shaune Pascal, MD  metolazone (ZAROXOLYN) 2.5 MG tablet Take 0.5-1 tablets (1.25-2.5 mg total) by mouth daily as needed (edema, weight gain>3#,). 05/05/19   Mosie Lukes, MD  metolazone (ZAROXOLYN) 5 MG tablet Take 2.5 mg by mouth at bedtime.    [provider]  pantoprazole (PROTONIX) 40 MG tablet Take 1 tablet (40 mg total) by mouth daily before breakfast. 02/15/19   Shelly Coss, MD  polyethylene glycol-electrolytes (NULYTELY/GOLYTELY) 420 g solution Take 4,000 mLs by mouth as directed. 04/18/19   Milus Banister, MD  potassium chloride (  K-DUR) 10 MEQ tablet Take 2 tablets (20 mEq total) by mouth daily. 09/27/18   Saguier, Percell Miller, PA-C  rosuvastatin (CRESTOR) 5 MG tablet Take 1 tablet (5 mg total) by mouth daily. 09/27/18   Saguier, Percell Miller, PA-C  Selexipag (UPTRAVI) 1000 MCG TABS Take 1,000 mcg by mouth 2 (two) times daily. 09/24/18   Mardie Kellen, Shaune Pascal, MD  spironolactone (ALDACTONE) 50 MG tablet TAKE 1 TABLET DAILY Patient taking differently: Take 50 mg by mouth daily.  02/27/17   Mosie Lukes, MD  tadalafil, PAH, (ADCIRCA) 20 MG tablet TAKE 2 TABLETS (40 MG TOTAL) DAILY Patient taking differently: Take 40 mg by mouth daily.  07/19/18   Treyten Monestime, Shaune Pascal, MD  thiamine 100 MG tablet Take 1 tablet (100 mg total) by mouth daily. 05/05/19   Mosie Lukes, MD    Past Medical History: Past Medical History:  Diagnosis Date  . Alcohol dependence ([place]) 03/22/2011   In remission   . Alcohol dependence in remission ([place]) 03/22/2011   In remission   . Alcoholic cirrhosis of liver with ascites ([place]) 2014  . Anginal pain ([place])    with SOB admitted 04/20/2019  . Cardiomegaly 02/2019   Noted on CXR  . CHF (congestive heart failure) ([place])   . CKD (chronic kidney disease), stage III ([place]) 07/18/2013  . COPD (chronic obstructive pulmonary disease) ([place])   .  Coronary artery disease   . Dermatitis 06/10/2015  . Diarrhea 09/04/2013  . Dyspnea    admitted 04/20/2019  . Elbow pain, left 03/17/2017  . Eustachian tube dysfunction   . GERD (gastroesophageal reflux disease)   . Gout   . Headache   . Hearing loss   . Heart murmur   . Hx of colonic polyps   . Hypertension   . Hypertriglyceridemia 03/17/2017  . Hypopotassemia   . Otalgia 11/28/2013  . Other chronic pulmonary heart diseases   . PAH (pulmonary artery hypertension) ([facility])    RV failure  . Pancytopenia ([place])   . Pleural effusion 2014   Noted on CT:   Moderately large simple layering right pleural effusion   . Preventative health care 06/29/2016  . Pulmonary nodule 2014   Noted on CT: An 8 mm pulmonary nodule in the periphery of the left lower lobe  . Rectal bleeding   . Red eye 03/03/2016   right  . Seizures ([place])   . Sleep apnea    does not wear CPAP  . Splenomegaly 12/2015   Noted on CT  . Thoracic aorta atherosclerosis ([place]) 12/2017  . Thrombocytopenia ([place]) 06/29/2016  . Unspecified hypothyroidism 01/25/2013  . Unspecified pleural effusion   . Vertigo   . Wears glasses     Past Surgical History: Past Surgical History:  Procedure Laterality Date  . CARDIAC CATHETERIZATION N/A 12/21/2015   Procedure: Right Heart Cath;  Surgeon: Jolaine Artist, MD;  Location: [facility];  Service: Cardiovascular;  Laterality: N/A;  . CARDIAC CATHETERIZATION N/A 04/15/2016   Procedure: Right/Left Heart Cath and Coronary Angiography;  Surgeon: Jolaine Artist, MD;  Location: [facility];  Service: Cardiovascular;  Laterality: N/A;  . COLONOSCOPY N/A 12/08/2013   Procedure: COLONOSCOPY;  Surgeon: Milus Banister, MD;  Location: [facility];  Service: Endoscopy;  Laterality: N/A;  . COLONOSCOPY WITH PROPOFOL N/A 03/24/2019   Procedure: COLONOSCOPY WITH PROPOFOL;  Surgeon: Milus Banister, MD;  Location: [facility];  Service: Endoscopy;  Laterality: N/A;  Draw CBC in preop  .  COLONOSCOPY WITH PROPOFOL N/A 04/28/2019   Procedure: COLONOSCOPY WITH PROPOFOL;  Surgeon: Milus Banister, MD;  Location: [facility];  Service: Endoscopy;  Laterality: N/A;  . ESOPHAGOGASTRODUODENOSCOPY (EGD) WITH PROPOFOL N/A 03/27/2016   Procedure: ESOPHAGOGASTRODUODENOSCOPY (EGD) WITH PROPOFOL;  Surgeon: Milus Banister, MD;  Location: [facility];  Service: Endoscopy;  Laterality: N/A;  . ESOPHAGOGASTRODUODENOSCOPY (EGD) WITH PROPOFOL N/A 02/14/2019   Procedure: ESOPHAGOGASTRODUODENOSCOPY (EGD) WITH PROPOFOL;  Surgeon: Jerene Bears, MD;  Location[facility] END[facility];  Service: Gastroenterology;  Laterality: N/A;  . EYE SURGERY Bilateral 03/15/2019   lasix  . HEMOSTASIS CLIP PLACEMENT  04/28/2019   Procedure: HEMOSTASIS CLIP PLACEMENT;  Surgeon: Milus Banister, MD;  Location: [facility];  Service: Endoscopy;;  . LOOP RECORDER INSERTION N/A 01/01/2017   Procedure: Loop Recorder Insertion;  Surgeon: Thompson Grayer, MD;  Location: [facility];  Service: Cardiovascular;  Laterality: N/A;  . POLYPECTOMY  04/28/2019   Procedure: POLYPECTOMY;  Surgeon: Milus Banister, MD;  Location: [facility];  Service: Endoscopy;;  . RIGHT HEART CATHETERIZATION Right 06/06/2014   Procedure: RIGHT HEART CATH;  Surgeon: Jolaine Artist, MD;  Location: [facility] LAB;  Service: Cardiovascular;  Laterality: Right;  . UVULOPALATOPHARYNGOPLASTY  1999    Family History:  Family History  Problem Relation Age of Onset  . Heart disease Mother   . Heart attack Mother   . Hypertension Mother   . Kidney failure Mother   . Liver disease Mother        stage 4 liver disease  . Prostate cancer Father   . Cancer Father        lung  . Hypertension Brother   . Gout Brother   . Alcoholism Maternal Grandfather   . Liver disease Maternal Grandfather   . Migraines Sister   . Hypertension Brother   . Colon cancer Neg Hx     Social History: Social History   Socioeconomic History  . Marital status: Divorced     Spouse name: Not on file  . Number of children: 3  . Years of education: Not on file  . Highest education level: Not on file  Occupational History  . Occupation: Retired    Comment: Microbiologist  . Financial resource strain: Not hard at all  . Food insecurity    Worry: Never true    Inability: Never true  . Transportation needs    Medical: No    Non-medical: No  Tobacco Use  . Smoking status: Former Smoker    Packs/day: 2.00    Years: 5.00    Pack years: 10.00    Types: Cigarettes    Quit date: 09/22/1979    Years since quitting: 39.6  . Smokeless tobacco: Never Used  Substance and Sexual Activity  . Alcohol use: Yes    Alcohol/week: 6.0 standard drinks    Types: 6 Shots of liquor per week    Comment: social drinker  . Drug use: No  . Sexual activity: Yes    Birth control/protection: Spermicide  Lifestyle  . Physical activity    Days per week: 0 days    Minutes per session: 0 min  . Stress: Rather much  Relationships  . Social connections    Talks on phone: More than three times a week    Gets together: Three times a week    Attends religious service: More than 4 times per year    Active member of club or organization: Yes  Attends meetings of clubs or organizations: More than 4 times per year    Relationship status: Divorced  Other Topics Concern  . Not on file  Social History Narrative   0 caffeine drinks daily     Allergies:  Allergies  Allergen Reactions  . Aspirin Hypertension  . Nitroglycerin     Headache    Objective:    Vital Signs:       There were no vitals filed for this visit.   Physical Exam     General:  Weak appearing. +SOB HEENT: Normal Neck: Supple.  JVP to ear Carotids 2+ bilat; no bruits. No lymphadenopathy or thyromegaly appreciated. Cor: PMI nondisplaced. Regular rate & rhythm. 2/6 TR + RV lift. Loud P2 Lungs: Clear but dul at both bases Abdomen: Soft, nontender, + distended. No hepatosplenomegaly. No bruits  or masses. Good bowel sounds. Extremities: No cyanosis, clubbing, rash, 3+ edema Neuro: Alert & oriented x 3, cranial nerves grossly intact. moves all 4 extremities w/o difficulty. Affect pleasant.   Telemetry   Pending  EKG   Pending  Labs     Basic Metabolic Panel: No results for input(s): NA, K, CL, CO2, GLUCOSE, BUN, CREATININE, CALCIUM, MG, PHOS in the last 168 hours.  Liver Function Tests: No results for input(s): AST, ALT, ALKPHOS, BILITOT, PROT, ALBUMIN in the last 168 hours. No results for input(s): LIPASE, AMYLASE in the last 168 hours. No results for input(s): AMMONIA in the last 168 hours.  CBC: No results for input(s): WBC, NEUTROABS, HGB, HCT, MCV, PLT in the last 168 hours.  Cardiac Enzymes: No results for input(s): CKTOTAL, CKMB, CKMBINDEX, TROPONINI in the last 168 hours.  BNP: BNP (last 3 results) Recent Labs    07/22/18 1255 02/12/19 2356  BNP 264.2* 158.1*    ProBNP (last 3 results) Recent Labs    09/22/18 1405 09/27/18 0913  PROBNP 859.0* 415.0*     CBG: No results for input(s): GLUCAP in the last 168 hours.  Coagulation Studies: No results for input(s): LABPROT, INR in the last 72 hours.  Imaging: Dg Chest 2 View  Result Date: 05/16/2019 CLINICAL DATA:  Bilateral leg swelling and shortness of breath. EXAM: CHEST - 2 VIEW COMPARISON:  February 13, 2019 FINDINGS: Loop recorder is again seen. The cardiac silhouette is enlarged. Minimal increase of the interstitial markings. No definite pleural effusions. Osseous structures are without acute abnormality. Soft tissues are grossly normal. IMPRESSION: 1. Enlarged cardiac silhouette. 2. Mild interstitial pulmonary edema. Electronically Signed   By: Fidela Salisbury M.D.   On: 05/16/2019 09:31     Assessment/Plan   1. Acute on chronic diastolic HF - marked volume overload on exam. Not responding to outpatient diuretics.  - admit for IV diuresis. Repeat echo - suspect pleural effusions on  exam. Check CXR  2. PAH: Suspected portopulmonary HTN in setting of ETOH cirrhosis. - On triple therapy with NYHA III symptoms at baseline.6MW 9/19 1280 feet (390 meters) - Echo 9/19 with EF 60-65% RV markedly dilated and hypokinetic with D-shaped septum - Continue selexipag 1000 mcg twice a day. Intolerant higher dose due to diarrhea and headaches .  - Continue macitentan 10 daily and adcirca 40 mg daily. - With RV strain on echo may need to consider IV therapies in the near future. - Repeat ech  3. CKD III:  -Baseline creatinine 1.5-2.0 - Repeat today - Followed by Dr. Marval Regal   4. Cirrhosis with small esoph varices: - Likely combination of RV failure and ETOH -  Follows with Dr. Ardis Hughs - Did not tolerate nadolol. - Recent EGC 4/20 stable - Discussed importance of eliminating all alcohol intake.   5. Recurrent syncope/seizure - LINQ interrogations have shownno arrhythmias and thus most likely seizure - Continue Keppra. Follows with Dr. Delice Lesch  6. OSA:  - Has mild OSA with AHI 12 and desats down to 82%.  - Intolerant CPAP.   7. Pancytopenia:  - Has seen Dr. Alen Blew in Hematology - felt like it is due to splenic sequestration in setting of cirrhosis. -Platlelets down to 29 on 6/10. He has follow up with hematology.  - check CBC. No current evidence of bleedign    Glori Bickers, MD 05/16/2019, 10:59 AM  Advanced Heart Failure Team Pager [phone] (M-F; [place])  Please contact [facility] for night-coverage after hours (4p -7a ) and weekends on [email or web address]

## 2019-05-16 NOTE — Telephone Encounter (Signed)
Faxing stab labs to his cardiologist.

## 2019-05-16 NOTE — Telephone Encounter (Signed)
CRITICAL VALUE STICKER  CRITICAL VALUE: WBC 1.8,  Platelet 33,000  RECEIVER (on-site recipient of call): Kelle Darting, [place] NOTIFIED: 05/16/19 @ 11am  MESSENGER (representative from lab): Santiago Glad  MD NOTIFIED: Mackie Pai, PA-C  TIME OF NOTIFICATION:11 am  RESPONSE:

## 2019-05-16 NOTE — Progress Notes (Signed)
  He is in decompensated HF with 20 pound weight gain. Admit to hospital for further management.   Glori Bickers, MD  10:49 AM

## 2019-05-17 ENCOUNTER — Inpatient Hospital Stay (HOSPITAL_COMMUNITY): Payer: Medicare Other

## 2019-05-17 ENCOUNTER — Ambulatory Visit: Payer: Medicare Other | Admitting: Oncology

## 2019-05-17 ENCOUNTER — Other Ambulatory Visit: Payer: Medicare Other

## 2019-05-17 ENCOUNTER — Encounter (HOSPITAL_COMMUNITY): Payer: Self-pay | Admitting: *Deleted

## 2019-05-17 DIAGNOSIS — I361 Nonrheumatic tricuspid (valve) insufficiency: Secondary | ICD-10-CM

## 2019-05-17 DIAGNOSIS — I351 Nonrheumatic aortic (valve) insufficiency: Secondary | ICD-10-CM

## 2019-05-17 LAB — BASIC METABOLIC PANEL
Anion gap: 10 (ref 5–15)
BUN: 24 mg/dL — ABNORMAL HIGH (ref 8–23)
CO2: 20 mmol/L — ABNORMAL LOW (ref 22–32)
Calcium: 8.5 mg/dL — ABNORMAL LOW (ref 8.9–10.3)
Chloride: 109 mmol/L (ref 98–111)
Creatinine, Ser: 2.09 mg/dL — ABNORMAL HIGH (ref 0.61–1.24)
GFR calc Af Amer: 36 mL/min — ABNORMAL LOW (ref >60)
GFR calc non Af Amer: 31 mL/min — ABNORMAL LOW (ref >60)
Glucose, Bld: 68 mg/dL — ABNORMAL LOW (ref 70–99)
Potassium: 3.6 mmol/L (ref 3.5–5.1)
Sodium: 139 mmol/L (ref 135–145)

## 2019-05-17 LAB — ECHOCARDIOGRAM COMPLETE
Height: 72 in
Weight: 3305.14 oz

## 2019-05-17 NOTE — Progress Notes (Signed)
Advanced Heart Failure Rounding Note   Subjective:    Admitted yesterday for volume overload.   He is diuresing with IV lasix. But remains SOB and very weak. No orthopnea or PND. Weight down 7 pounds.   Creatinine 1.9 -> 2.1  Objective:   Weight Range:  Vital Signs:   Temp:  [97.6 F (36.4 C)-98.3 F (36.8 C)] 97.6 F (36.4 C) (07/14 0740) Pulse Rate:  [69-81] 81 (07/14 0847) Resp:  [15-18] 16 (07/14 0437) BP: (114-119)/(74-99) 119/78 (07/14 0740) SpO2:  [96 %-100 %] 100 % (07/14 0847) Weight:  [93.7 kg-96.8 kg] 93.7 kg (07/14 0437) Last BM Date: 05/16/19  Weight change: Filed Weights   05/16/19 1537 05/17/19 0437  Weight: 96.8 kg 93.7 kg    Intake/Output:   Intake/Output Summary (Last 24 hours) at 05/17/2019 1335 Last data filed at 05/17/2019 0900 Gross per 24 hour  Intake 720 ml  Output 2400 ml  Net -1680 ml     Physical Exam: General:  Weak appearing. No resp difficulty HEENT: normal Neck: supple. JVP to ear . Carotids 2+ bilat; no bruits. No lymphadenopathy or thryomegaly appreciated. Cor: PMI nondisplaced. Regular rate & rhythm. 2/6 TR + RV lift  Lungs: clear Abdomen: soft, nontender, nondistended. No hepatosplenomegaly. No bruits or masses. Good bowel sounds. Extremities: no cyanosis, clubbing, rash, 2+ edema Neuro: alert & orientedx3, cranial nerves grossly intact. moves all 4 extremities w/o difficulty. Affect pleasant  Telemetry: Sinus 80s Personally reviewed   Labs: Basic Metabolic Panel: Recent Labs  Lab 05/16/19 0906 05/16/19 1051 05/17/19 0428  NA 137 138 139  K 3.7 3.8 3.6  CL 109 113* 109  CO2 19 16* 20*  GLUCOSE 88 90 68*  BUN 28* 26* 24*  CREATININE 1.79* 1.91* 2.09*  CALCIUM 8.3* 8.7* 8.5*  MG  --  1.9  --     Liver Function Tests: Recent Labs  Lab 05/16/19 0906 05/16/19 1051  AST 37 45*  ALT 14 20  ALKPHOS 257* 261*  BILITOT 1.4* 1.7*  PROT 6.8 7.0  ALBUMIN 3.3* 3.1*   No results for input(s): LIPASE, AMYLASE  in the last 168 hours. No results for input(s): AMMONIA in the last 168 hours.  CBC: Recent Labs  Lab 05/16/19 0906 05/16/19 1051  WBC 1.8 Repeated and verified X2.* 1.8*  NEUTROABS 1.0*  --   HGB 9.5* 9.6*  HCT 29.5* 31.3*  MCV 104.1* 107.6*  PLT 33.0 Repeated and verified X2.* 39*    Cardiac Enzymes: No results for input(s): CKTOTAL, CKMB, CKMBINDEX, TROPONINI in the last 168 hours.  BNP: BNP (last 3 results) Recent Labs    07/22/18 1255 02/12/19 2356 05/16/19 1051  BNP 264.2* 158.1* 1,119.2*    ProBNP (last 3 results) Recent Labs    09/22/18 1405 09/27/18 0913 05/16/19 0906  PROBNP 859.0* 415.0* 1,293.0*      Other results:  Imaging: Dg Chest 2 View  Result Date: 05/16/2019 CLINICAL DATA:  68 year old current history of CHF, hypertension and coronary artery disease. Former smoker. EXAM: CHEST - 2 VIEW 6:38 p.m.: COMPARISON:  Two-view chest x-ray earlier same day 9:22 a.m. and previously. FINDINGS: Cardiac silhouette moderately enlarged, unchanged. Thoracic aorta mildly tortuous and atherosclerotic. Enlarged central LEFT pulmonary artery, unchanged. Hilar and mediastinal contours otherwise unremarkable. Mild scarring in the lower lobes and RIGHT MIDDLE LOBE, unchanged. Lungs otherwise clear. Bronchovascular markings normal. Pulmonary vascularity normal without evidence of interstitial edema as questioned earlier. No visible pleural effusions. No pneumothorax. Visualized bony thorax intact.  Implantable cardiac recording device overlies the LEFT chest. IMPRESSION: 1. Stable cardiomegaly. No acute cardiopulmonary disease. No evidence of pulmonary edema as questioned earlier today. 2. Stable mild scarring in the BILATERAL lower lobes and RIGHT middle lobe. Electronically Signed   By: Evangeline Dakin M.D.   On: 05/16/2019 19:02   Dg Chest 2 View  Result Date: 05/16/2019 CLINICAL DATA:  Bilateral leg swelling and shortness of breath. EXAM: CHEST - 2 VIEW COMPARISON:   February 13, 2019 FINDINGS: Loop recorder is again seen. The cardiac silhouette is enlarged. Minimal increase of the interstitial markings. No definite pleural effusions. Osseous structures are without acute abnormality. Soft tissues are grossly normal. IMPRESSION: 1. Enlarged cardiac silhouette. 2. Mild interstitial pulmonary edema. Electronically Signed   By: Fidela Salisbury M.D.   On: 05/16/2019 09:31      Medications:     Scheduled Medications: . allopurinol  200 mg Oral Daily  . enoxaparin (LOVENOX) injection  40 mg Subcutaneous Q24H  . Ferrous Fumarate  1 tablet Oral Daily  . folic acid  1 mg Oral Daily  . furosemide  80 mg Intravenous BID  . hyoscyamine  0.125 mg Sublingual Daily  . macitentan  10 mg Oral Daily  . mometasone-formoterol  2 puff Inhalation BID  . [START ON 05/18/2019] pantoprazole  40 mg Oral QAC breakfast  . rosuvastatin  5 mg Oral Daily  . Selexipag  1,000 mcg Oral BID  . sodium chloride flush  3 mL Intravenous Q12H  . spironolactone  50 mg Oral Daily  . tadalafil (PAH)  40 mg Oral Daily  . thiamine  100 mg Oral Daily     Infusions: . sodium chloride       PRN Medications:  sodium chloride, acetaminophen, albuterol, ondansetron (ZOFRAN) IV, sodium chloride flush   Assessment/Plan:   1. Acute on chronic diastolic HF - improved with IV lasix but still with volume on board. Continue IV diuresis  - Repeat echo pending  - CXR ok  - Place TED hose  2. PAH: Suspected portopulmonary HTN in setting of ETOH cirrhosis. - On triple therapy with NYHA III symptoms at baseline.6MW 9/19 1280 feet (390 meters) - Echo 9/19 with EF 60-65% RV markedly dilated and hypokinetic with D-shaped septum - Continue selexipag 1000 mcg twice a day. Intolerant higher dose due to diarrhea and headaches .  - Continue macitentan 10 daily and adcirca 40 mg daily. - With RV strain on echo may need to consider IV therapies in the near future but with comorbidities may not be  canddiate  3. Acute on CKD III:  -Baseline creatinine 1.5-2.0 - Creatinine 1.9-> 2.1 with diuresis. Follow cloesely.  - Followed by Dr. Marval Regal   4. Cirrhosis with small esoph varices: - Likely combination of RV failure and ETOH - Follows with Dr. Ardis Hughs - Did not tolerate nadolol. - Recent EGC 4/20 stable - Discussed importance of eliminating all alcohol intake.  5. Recurrent syncope/seizure - LINQ interrogations have shownno arrhythmias and thus most likely seizure - Continue Keppra. Follows with Dr. Delice Lesch  6. OSA:  - Has mild OSA with AHI 12 and desats down to 82%.  - Intolerant CPAP.   7. Pancytopenia:  - Has seen Dr. Alen Blew in Hematology - felt like it is due to splenic sequestration in setting of cirrhosis. - remains quite severe today. No bleeding - I wil d/w Dr. Alen Blew regarding need for BMBx - Also need to consider if it may be related to [place]  Glori Bickers, MD  1:39 PM    Length of Stay: 1   Glori Bickers MD 05/17/2019, 1:35 PM  Advanced Heart Failure Team Pager [phone] (M-F; [place])  Please contact [facility] for night-coverage after hours (4p -7a ) and weekends on [email or web address]

## 2019-05-17 NOTE — Progress Notes (Signed)
Assisted by NT by wheelchair to the CHF parking garage to get his home med.

## 2019-05-17 NOTE — Progress Notes (Signed)
  Echocardiogram 2D Echocardiogram has been performed.  Burnett Kanaris 05/17/2019, 8:42 AM

## 2019-05-17 NOTE — Progress Notes (Signed)
Medication selexipag  is non-formulary. Claimed  Home med is in the car parked in the CHF parking lot. Discussed with MD who okeyed to have pt  get said med from the car assisted by the staff.

## 2019-05-17 NOTE — Progress Notes (Signed)
Home meds  delivered to pharmacy.white form placed  in the chart.

## 2019-05-17 NOTE — TOC Initial Note (Signed)
Transition of Care [facility]) - Initial/Assessment Note    Patient Details  Name: Darren Mueller MRN: [id number] Date of Birth: 02-Mar-1951  Transition of Care [facility]) CM/SW Contact:    Benard Halsted, LCSW Phone Number: 05/17/2019, 4:59 PM  Clinical Narrative:                 Care Coordination moderate risk assessment completed with patient. He reports that he lives with his son and drives himself to appointments. PCP confirmed. No concerns reported by patient.   Expected Discharge Plan: Home/Self Care Barriers to Discharge: Continued Medical Work up   Patient Goals and CMS Choice Patient states their goals for this hospitalization and ongoing recovery are:: Return home      Expected Discharge Plan and Services Expected Discharge Plan: Home/Self Care       Living arrangements for the past 2 months: Single Family Home Expected Discharge Date: 05/20/19                                    Prior Living Arrangements/Services Living arrangements for the past 2 months: Single Family Home Lives with:: Adult Children          Need for Family Participation in Patient Care: No (Comment) Care giver support system in place?: Yes (comment)   Criminal Activity/Legal Involvement Pertinent to Current Situation/Hospitalization: No - Comment as needed  Activities of Daily Living Home Assistive Devices/Equipment: None ADL Screening (condition at time of admission) Patient's cognitive ability adequate to safely complete daily activities?: Yes Is the patient deaf or have difficulty hearing?: No Does the patient have difficulty seeing, even when wearing glasses/contacts?: No Does the patient have difficulty concentrating, remembering, or making decisions?: No Patient able to express need for assistance with ADLs?: Yes Does the patient have difficulty dressing or bathing?: No Independently performs ADLs?: Yes (appropriate for developmental age) Does the patient have difficulty walking  or climbing stairs?: Yes Weakness of Legs: Both Weakness of Arms/Hands: None  Permission Sought/Granted                  Emotional Assessment Appearance:: Appears stated age Attitude/Demeanor/Rapport: Gracious, Charismatic Affect (typically observed): Accepting, Appropriate, Pleasant Orientation: : Oriented to Self, Oriented to Place, Oriented to  Time, Oriented to Situation Alcohol / Substance Use: Not Applicable Psych Involvement: No (comment)  Admission diagnosis:  Heart Failure Patient Active Problem List   Diagnosis Date Noted  . Acute on chronic diastolic heart failure ([place]) 05/16/2019  . Acute on chronic diastolic (congestive) heart failure ([place]) 05/16/2019  . Calf pain 04/03/2019  . History of colonic polyps   . Polyp of cecum   . Headache 02/21/2019  . Rectal bleeding 02/13/2019  . COPD (chronic obstructive pulmonary disease) ([place]) 02/13/2019  . Acute on chronic right heart failure ([place]) 02/13/2019  . Hematochezia   . Ascites   . Dark stools   . Alcoholism ([place])   . Hyperglycemia 05/04/2018  . Seizure disorder ([place]) 02/01/2018  . Pedal edema 10/01/2017  . RUQ pain 05/06/2017  . Decreased range of motion of left elbow 04/15/2017  . Hyperlipidemia 03/17/2017  . Elbow pain, left 03/17/2017  . Right rib fracture   . Syncope 07/19/2016  . Thrombocytopenia ([place]) 06/29/2016  . Hoarseness, chronic 06/29/2016  . Preventative health care 06/29/2016  . Chest pain   . Low back pain 12/09/2015  . Cirrhosis with alcoholism ([place]) 08/08/2015  . Pancytopenia (  [place]) 06/10/2015  . Dermatitis 06/10/2015  . OSA (obstructive sleep apnea) 02/13/2015  . Gout 07/30/2014  . Diarrhea 09/04/2013  . Chronic renal insufficiency, stage III (moderate) ([place]) 07/18/2013  . Right heart failure due to pulmonary hypertension ([place]) 06/06/2013  . Hypothyroidism 01/25/2013  . PAH (pulmonary arterial hypertension) with portal hypertension ([place]) 01/21/2013  . Allergic rhinitis 12/21/2012   . Secondary pulmonary hypertension 12/09/2012  . Hepatic cirrhosis ([place]) 11/26/2012  . GERD (gastroesophageal reflux disease) 09/05/2011  . Hypokalemia 08/29/2011  . Colon polyps 03/26/2011  . Insomnia 03/26/2011  . Hypertension 03/22/2011  . Alcohol dependence ([place]) 03/22/2011   PCP:  Mosie Lukes, MD Pharmacy:   [place], [place] [place] [address] [place] [address] Phone: [phone] Fax: [phone]  [facility] [place], [place] [address] [place] [address] Phone: [phone] Fax: [phone]  [place], [address] [place] [address] Phone: [phone] Fax: [phone]     Social Determinants of Health (SDOH) Interventions    Readmission Risk Interventions Readmission Risk Prevention Plan 05/17/2019 02/14/2019  Transportation Screening Complete Complete  PCP or Specialist Appt within 3-5 Days Complete Not Complete  Not Complete comments - CM had to leave VM for office staff - requesting appt  [place] or [place] Complete Complete  Social Work Consult for [place] Planning/Counseling Complete Complete  Palliative Care Screening Not Applicable Not Applicable  Medication Review (RN Care Manager) Referral to Pharmacy Referral to Pharmacy  Some recent data might be hidden

## 2019-05-18 ENCOUNTER — Encounter (HOSPITAL_COMMUNITY): Payer: Self-pay

## 2019-05-18 LAB — CBC WITH DIFFERENTIAL/PLATELET
Abs Immature Granulocytes: 0.01 10*3/uL (ref 0.00–0.07)
Basophils Absolute: 0 10*3/uL (ref 0.0–0.1)
Basophils Relative: 1 %
Eosinophils Absolute: 0.1 10*3/uL (ref 0.0–0.5)
Eosinophils Relative: 2 %
HCT: 31.2 % — ABNORMAL LOW (ref 39.0–52.0)
Hemoglobin: 9.9 g/dL — ABNORMAL LOW (ref 13.0–17.0)
Immature Granulocytes: 1 %
Lymphocytes Relative: 24 %
Lymphs Abs: 0.5 10*3/uL — ABNORMAL LOW (ref 0.7–4.0)
MCH: 33.2 pg (ref 26.0–34.0)
MCHC: 31.7 g/dL (ref 30.0–36.0)
MCV: 104.7 fL — ABNORMAL HIGH (ref 80.0–100.0)
Monocytes Absolute: 0.3 10*3/uL (ref 0.1–1.0)
Monocytes Relative: 12 %
Neutro Abs: 1.4 10*3/uL — ABNORMAL LOW (ref 1.7–7.7)
Neutrophils Relative %: 60 %
Platelets: 35 10*3/uL — ABNORMAL LOW (ref 150–400)
RBC: 2.98 MIL/uL — ABNORMAL LOW (ref 4.22–5.81)
RDW: 20.8 % — ABNORMAL HIGH (ref 11.5–15.5)
WBC: 2.2 10*3/uL — ABNORMAL LOW (ref 4.0–10.5)
nRBC: 0 % (ref 0.0–0.2)

## 2019-05-18 LAB — BASIC METABOLIC PANEL
Anion gap: 9 (ref 5–15)
BUN: 24 mg/dL — ABNORMAL HIGH (ref 8–23)
CO2: 22 mmol/L (ref 22–32)
Calcium: 8.7 mg/dL — ABNORMAL LOW (ref 8.9–10.3)
Chloride: 108 mmol/L (ref 98–111)
Creatinine, Ser: 1.99 mg/dL — ABNORMAL HIGH (ref 0.61–1.24)
GFR calc Af Amer: 39 mL/min — ABNORMAL LOW (ref >60)
GFR calc non Af Amer: 33 mL/min — ABNORMAL LOW (ref >60)
Glucose, Bld: 101 mg/dL — ABNORMAL HIGH (ref 70–99)
Potassium: 3.2 mmol/L — ABNORMAL LOW (ref 3.5–5.1)
Sodium: 139 mmol/L (ref 135–145)

## 2019-05-18 MED ORDER — POTASSIUM CHLORIDE CRYS ER 20 MEQ PO TBCR
40.0000 meq | EXTENDED_RELEASE_TABLET | ORAL | Status: AC
  Administered 2019-05-18 (×2): 40 meq via ORAL
  Filled 2019-05-18 (×2): qty 2

## 2019-05-18 NOTE — Progress Notes (Signed)
Patient c/o diarrhea through the night. Patient had 3 time BM in the morning, but it didn't look like stool c-diff. Dr. Haroldine Laws made aware of it and sent stool culture. Patient had more BM in the afternoon. Patient refused to sit on the chair because he couldn't sleep d/t diarrhea. He is resting on the bed. HS [employer]

## 2019-05-18 NOTE — Plan of Care (Signed)
  Problem: Education: Goal: Ability to demonstrate management of disease process will improve Outcome: Progressing Goal: Ability to verbalize understanding of medication therapies will improve Outcome: Progressing   Problem: Cardiac: Goal: Ability to achieve and maintain adequate cardiopulmonary perfusion will improve Outcome: Progressing

## 2019-05-18 NOTE — Progress Notes (Signed)
I was asked by Dr. Haroldine Mueller to comment about Darren Mueller pancytopenia.  He is a 68 year old man known to me with longstanding history of cirrhosis of the liver and cytopenia related to that.  His thrombocytopenia is dating back to at least 2012 with the range between 30-40,000 since that time.   He was hospitalized with heart failure and currently receiving treatment regarding that.  CBC obtained on 05/18/2019 showed a white cell count of 2.2 which is close to his baseline with normal differential.  His platelet count is around 35 which is also consistent with his baseline.  His counts were reviewed dating back to 2014 and the scans are consistent with his current CBC which is a consequence of a splenic sequestration.  The likelihood that we are dealing with a primary hematological issue such as myelodysplastic syndrome, myeloproliferative disorder, leukemia or any other malignancy is very low.  I see no need for a bone marrow biopsy at this time.  No active bleeding or infection is noted at this time.  His absolute neutrophil count is more than adequate at 1400 and does not put him at risk for opportunistic infections.

## 2019-05-18 NOTE — Progress Notes (Signed)
Advanced Heart Failure Rounding Note   Subjective:    He is diuresing with IV lasix. Weight done another 9 pounds (16 pounds total). Breathing better but still weak. Denies orthopnea or PND. Had severe diarrhea last night.   Creatinine 1.9 -> 2.1 -> 1.99  K 3.2  Objective:   Weight Range:  Vital Signs:   Temp:  [97.4 F (36.3 C)-97.9 F (36.6 C)] 97.4 F (36.3 C) (07/15 0300) Pulse Rate:  [70-90] 79 (07/15 0745) Resp:  [18-22] 22 (07/15 0300) BP: (105-122)/(70-77) 122/77 (07/15 0745) SpO2:  [98 %-100 %] 98 % (07/15 0745) Weight:  [89.6 kg] 89.6 kg (07/15 0300) Last BM Date: 05/17/19  Weight change: Filed Weights   05/16/19 1537 05/17/19 0437 05/18/19 0300  Weight: 96.8 kg 93.7 kg 89.6 kg    Intake/Output:   Intake/Output Summary (Last 24 hours) at 05/18/2019 0804 Last data filed at 05/18/2019 0744 Gross per 24 hour  Intake 547 ml  Output 3250 ml  Net -2703 ml     Physical Exam: General:  Weak appearing. No resp difficulty HEENT: normal Neck: supple. JVP 7 Carotids 2+ bilat; no bruits. No lymphadenopathy or thryomegaly appreciated. Cor: PMI nondisplaced. Regular rate & rhythm. 2/6 TR loud P2 Lungs: clear Abdomen: soft, nontender, nondistended. No hepatosplenomegaly. No bruits or masses. Good bowel sounds. Extremities: no cyanosis, clubbing, rash, trace edema Neuro: alert & orientedx3, cranial nerves grossly intact. moves all 4 extremities w/o difficulty. Affect pleasant   Telemetry: Sinus 70-80s Personally reviewed   Labs: Basic Metabolic Panel: Recent Labs  Lab 05/16/19 0906 05/16/19 1051 05/17/19 0428 05/18/19 0257  NA 137 138 139 139  K 3.7 3.8 3.6 3.2*  CL 109 113* 109 108  CO2 19 16* 20* 22  GLUCOSE 88 90 68* 101*  BUN 28* 26* 24* 24*  CREATININE 1.79* 1.91* 2.09* 1.99*  CALCIUM 8.3* 8.7* 8.5* 8.7*  MG  --  1.9  --   --     Liver Function Tests: Recent Labs  Lab 05/16/19 0906 05/16/19 1051  AST 37 45*  ALT 14 20  ALKPHOS 257*  261*  BILITOT 1.4* 1.7*  PROT 6.8 7.0  ALBUMIN 3.3* 3.1*   No results for input(s): LIPASE, AMYLASE in the last 168 hours. No results for input(s): AMMONIA in the last 168 hours.  CBC: Recent Labs  Lab 05/16/19 0906 05/16/19 1051  WBC 1.8 Repeated and verified X2.* 1.8*  NEUTROABS 1.0*  --   HGB 9.5* 9.6*  HCT 29.5* 31.3*  MCV 104.1* 107.6*  PLT 33.0 Repeated and verified X2.* 39*    Cardiac Enzymes: No results for input(s): CKTOTAL, CKMB, CKMBINDEX, TROPONINI in the last 168 hours.  BNP: BNP (last 3 results) Recent Labs    07/22/18 1255 02/12/19 2356 05/16/19 1051  BNP 264.2* 158.1* 1,119.2*    ProBNP (last 3 results) Recent Labs    09/22/18 1405 09/27/18 0913 05/16/19 0906  PROBNP 859.0* 415.0* 1,293.0*      Other results:  Imaging: Dg Chest 2 View  Result Date: 05/16/2019 CLINICAL DATA:  68 year old current history of CHF, hypertension and coronary artery disease. Former smoker. EXAM: CHEST - 2 VIEW 6:38 p.m.: COMPARISON:  Two-view chest x-ray earlier same day 9:22 a.m. and previously. FINDINGS: Cardiac silhouette moderately enlarged, unchanged. Thoracic aorta mildly tortuous and atherosclerotic. Enlarged central LEFT pulmonary artery, unchanged. Hilar and mediastinal contours otherwise unremarkable. Mild scarring in the lower lobes and RIGHT MIDDLE LOBE, unchanged. Lungs otherwise clear. Bronchovascular markings normal. Pulmonary vascularity  normal without evidence of interstitial edema as questioned earlier. No visible pleural effusions. No pneumothorax. Visualized bony thorax intact. Implantable cardiac recording device overlies the LEFT chest. IMPRESSION: 1. Stable cardiomegaly. No acute cardiopulmonary disease. No evidence of pulmonary edema as questioned earlier today. 2. Stable mild scarring in the BILATERAL lower lobes and RIGHT middle lobe. Electronically Signed   By: Evangeline Dakin M.D.   On: 05/16/2019 19:02   Dg Chest 2 View  Result Date:  05/16/2019 CLINICAL DATA:  Bilateral leg swelling and shortness of breath. EXAM: CHEST - 2 VIEW COMPARISON:  February 13, 2019 FINDINGS: Loop recorder is again seen. The cardiac silhouette is enlarged. Minimal increase of the interstitial markings. No definite pleural effusions. Osseous structures are without acute abnormality. Soft tissues are grossly normal. IMPRESSION: 1. Enlarged cardiac silhouette. 2. Mild interstitial pulmonary edema. Electronically Signed   By: Fidela Salisbury M.D.   On: 05/16/2019 09:31     Medications:     Scheduled Medications: . allopurinol  200 mg Oral Daily  . enoxaparin (LOVENOX) injection  40 mg Subcutaneous Q24H  . Ferrous Fumarate  1 tablet Oral Daily  . folic acid  1 mg Oral Daily  . furosemide  80 mg Intravenous BID  . hyoscyamine  0.125 mg Sublingual Daily  . macitentan  10 mg Oral Daily  . mometasone-formoterol  2 puff Inhalation BID  . pantoprazole  40 mg Oral QAC breakfast  . rosuvastatin  5 mg Oral Daily  . Selexipag  1,000 mcg Oral BID  . sodium chloride flush  3 mL Intravenous Q12H  . spironolactone  50 mg Oral Daily  . tadalafil (PAH)  40 mg Oral Daily  . thiamine  100 mg Oral Daily    Infusions: . sodium chloride      PRN Medications: sodium chloride, acetaminophen, albuterol, ondansetron (ZOFRAN) IV, sodium chloride flush   Assessment/Plan:   1. Acute on chronic diastolic HF - Volume status much improved. Now well below baseline weight. Switch back to po diuretics - Repeat echo showed normal LV function with severely dilated RV with moderate RV dysfunction RVSP 83mHG - CXR ok    2. PAH: Suspected portopulmonary HTN in setting of ETOH cirrhosis. - On triple therapy with NYHA III symptoms at baseline.6MW 9/19 1280 feet (390 meters) - Echo 9/19 with EF 60-65% RV markedly dilated and hypokinetic with D-shaped septum - Continue selexipag 1000 mcg twice a day. Intolerant higher dose due to diarrhea and headaches .  - Continue  macitentan 10 daily and adcirca 40 mg daily. - With severe RV strain on echo may need to consider IV therapies in the near future but with comorbidities may not be canddiate  3. Acute on CKD III:  -Baseline creatinine 1.5-2.0 - Creatinine 1.9-> 2.1 -> 1.99  - Followed by Dr. CMarval Regal  4. Cirrhosis with small esoph varices: - Likely combination of RV failure and ETOH - Follows with Dr. JArdis Hughs- Did not tolerate nadolol. - EGD 4/20 stable - Discussed importance of eliminating all alcohol intake.  5. Recurrent syncope/seizure - LINQ interrogations have shownno arrhythmias and thus most likely seizure - Continue Keppra. Follows with Dr. ADelice Lesch 6. OSA:  - Has mild OSA with AHI 12 and desats down to 82%.  - Intolerant CPAP.   7. Pancytopenia:  - Has seen Dr. SAlen Blewin Hematology - felt like it is due to splenic sequestration in setting of cirrhosis. - remains quite severe today. No bleeding - I d/w Dr. SAlen Blew  regarding need for BMBx and he feels counts are stable and no need for BMBX at this point.  - Also need to consider if it may be related to Macitentan  8. Hypokalemia - will supp  9. Diarrhea - send stool cx. Does not appear to be c. Diff.   Length of Stay: 2   Glori Bickers MD 05/18/2019, 8:04 AM  Advanced Heart Failure Team Pager [phone] (M-F; 7a - 4p)  Please contact [facility] for night-coverage after hours (4p -7a ) and weekends on [email or web address]

## 2019-05-19 ENCOUNTER — Ambulatory Visit (INDEPENDENT_AMBULATORY_CARE_PROVIDER_SITE_OTHER): Payer: Medicare Other | Admitting: *Deleted

## 2019-05-19 DIAGNOSIS — R55 Syncope and collapse: Secondary | ICD-10-CM

## 2019-05-19 LAB — BASIC METABOLIC PANEL
Anion gap: 11 (ref 5–15)
BUN: 21 mg/dL (ref 8–23)
CO2: 21 mmol/L — ABNORMAL LOW (ref 22–32)
Calcium: 8.3 mg/dL — ABNORMAL LOW (ref 8.9–10.3)
Chloride: 109 mmol/L (ref 98–111)
Creatinine, Ser: 1.99 mg/dL — ABNORMAL HIGH (ref 0.61–1.24)
GFR calc Af Amer: 39 mL/min — ABNORMAL LOW (ref >60)
GFR calc non Af Amer: 33 mL/min — ABNORMAL LOW (ref >60)
Glucose, Bld: 109 mg/dL — ABNORMAL HIGH (ref 70–99)
Potassium: 3.4 mmol/L — ABNORMAL LOW (ref 3.5–5.1)
Sodium: 141 mmol/L (ref 135–145)

## 2019-05-19 LAB — CUP PACEART REMOTE DEVICE CHECK
Date Time Interrogation Session: 20200716130912
Implantable Pulse Generator Implant Date: 20180301

## 2019-05-19 LAB — MAGNESIUM: Magnesium: 1.5 mg/dL — ABNORMAL LOW (ref 1.7–2.4)

## 2019-05-19 MED ORDER — POTASSIUM CHLORIDE CRYS ER 20 MEQ PO TBCR
40.0000 meq | EXTENDED_RELEASE_TABLET | Freq: Once | ORAL | Status: AC
  Administered 2019-05-19: 40 meq via ORAL
  Filled 2019-05-19: qty 2

## 2019-05-19 MED ORDER — MAGNESIUM SULFATE 4 GM/100ML IV SOLN
4.0000 g | Freq: Once | INTRAVENOUS | Status: AC
  Administered 2019-05-19: 10:00:00 4 g via INTRAVENOUS
  Filled 2019-05-19: qty 100

## 2019-05-19 MED ORDER — MAGNESIUM SULFATE 2 GM/50ML IV SOLN: 2.0000 g | Freq: Once | INTRAVENOUS | Status: DC

## 2019-05-19 NOTE — Discharge Summary (Addendum)
Discharge Summary    Patient ID: ESSAM LOWDERMILK MRN: [id number]; DOB: [birth year]  Admit date: 05/16/2019 Discharge date: 05/19/2019 Primary HF: Glori Bickers, MD   Discharge Diagnoses    Principal Problem:   Acute on chronic diastolic heart failure ([place]) Active Problems:   PAH (pulmonary arterial hypertension) with portal hypertension ([place])   Right heart failure due to pulmonary hypertension ([place])   Acute on chronic diastolic (congestive) heart failure (HCC)   Allergies Allergies  Allergen Reactions  . Aspirin Hypertension  . Nitroglycerin Other (See Comments)    Headache   Diagnostic Studies/Procedures    Echocardiogram 05/17/2019:  1. The left ventricle has hyperdynamic systolic function, with an ejection fraction of >65%. The cavity size was normal. There is moderately increased left ventricular wall thickness. Left ventricular diastolic Doppler parameters are consistent with  impaired relaxation. 8-15 There is right ventricular volume and pressure overload. No evidence of left ventricular regional wall motion abnormalities.  2. The right ventricle has moderately reduced systolic function. The cavity was severely enlarged. There is mildly increased right ventricular wall thickness.  3. Right atrial size was severely dilated.  4. The pericardial effusion is circumferential.  5. Trivial pericardial effusion is present.  6. Moderately thickened tricuspid valve leaflets.  7. The mitral valve is abnormal. Mild thickening of the mitral valve leaflet.  8. The tricuspid valve is myxomatous. Tricuspid valve regurgitation is moderate.  9. The aortic valve is tricuspid. Moderate sclerosis of the aortic valve. Aortic valve regurgitation is mild by color flow Doppler. No stenosis of the aortic valve. 10. The inferior vena cava was dilated in size with <50% respiratory variability. 11. When compared to the prior study: 07/22/2018: LVEF 65-70%, RVSP 68 mmHg.  History of Present  Illness     Azael Ragain Johnsonis a 68 y.o.male with history of diastolic HF, COPD, portopulmonary hypertension with RV failure and ETOH cirrhosis.  Mr. Redmon was admitted in 6/17 with CP and SOB in which he was diuresed. Troponin levels were found to be normal. He  underwent a R/L heart cath that showed minimal CAD with moderate PAH and preserved cardiac output.   He was admitted again 06/2016 and 07/2016 with syncope. On the  September admission he was found to have elevated ETOH level as well as right 6th rib fracture. He wore a 30 day monitor until 08/28/16 that showed no AF and no other arrhthymias. He was admitted again in February2018after syncopal event. At that time a LINQ was placed.   In4/20he was admitted with hematochezia and melena. His hemoglobin remained stable and had no episodes of GI bleed after hospitalization.He underwent an EGD that showed small esophageal varices (grade 1) and no evidence of recent bleeding or portal hypertensive gastropathy. He was started on nadolol which made him very dizzy and therefore this was discontinued.  He was last seen by Darrick Grinder, NP on 04/14/2019 in which his diuretics were noted to have been increased by his PCP. Overall he was feeling well without orthopnea or PND. Had some mild SOB with exertion and leg edema. Weight at that time was 203lb. He reported that he cut back on his alcohol intake to one time per week.   On 05/16/2019 he was noted to be decompensating with a 20lb weight gain with recommendations for admission for further management. His appetite was noted to be very poor but was eating lots of ice chips. On presentation he had BLE, orthopnea, and PND. He was taking his Lasix but was  not responding to therapy.    Hospital Course     During his hospital course, he was placed on IV diuretics with Lasix 17m BID with good response. He was noted to be down 21lbs by hospital day #3 with a negative fluid volume balance of 4L total  since day of admission. He was transitioned off IV to PO medications on 05/18/2019. Dr. BHaroldine Lawsasked oncology to consult given his pancytopenia. Records were reviewed in depth by consulting oncologist who felt that there were no acute issues and that his counts were a consequence of a splenic sequestration. The likelihood that primary hematological issue such as myelodysplastic syndrome, myeloproliferative disorder, leukemia or any other malignancy was noted to be very low and there was no recommendation for bone marrow biopsy at this time. By 05/19/2019, the patients breathing was improved and he was felt to be back at baseline. Repeat echocardiogram showed normal LV function with severely dilated RV with moderate RV dysfunction RVSP 787mG.   On morning of discharge, he had mild, asymptomatic hypokalemia and hypomagnesemia for which this was replaced.    Hospital problems include: Acute on chronic diastolic HF -Volume status much improved.  -Weight down to 192lb on 05/19/2019>>was 213lb on 05/16/2019 -Plan is for previous diuretic regimen -Repeat echo showed normal LV function with severely dilated RV with moderate RV dysfunction RVSP 7983m   PAH: Suspected portopulmonary HTN in setting of ETOH cirrhosis. -On triple therapy withNYHA III symptoms at baseline.6MW 9/19 1280 feet (390 meters) -Echo 9/19 with EF 60-65% RV markedly dilated and hypokinetic with D-shaped septum -Continue selexipag 1000 mcg twice a day. Intolerant higher dose due to diarrhea and headaches .  -Continue macitentan 10 daily and adcirca 40 mg daily. -With severe RV strain on echo may need to consider IV therapies in the near future but with comorbidities may not be candidate. Will discuss this further as outpatient  Acute on CKD III:  -Baseline creatinine 1.5-2.0 -Creatinine 1.9-> 2.1 -> 1.99  -> 1.99 -Followed by Dr. ColMarval RegalCirrhosiswith small esoph varices: -Likely combination of RV failure and  ETOH -Follows with Dr. JacArdis Hughsid not tolerate nadolol -EGD 4/20 stable -Discussed importance of eliminating all alcohol intake.  Recurrent syncope/seizure -LINQ interrogations have shownno arrhythmias and thus most likely seizure -Continue Keppra. Follows with Dr. AquDelice LeschSA:  -Has mild OSA with AHI 12 and desats down to 82%.  -Intolerant CPAP.   Pancytopenia:  -Has seen Dr. ShaAlen Blew Hematology>>oncology felt this is liekly due to splenic sequestrationin setting of cirrhosis. -Remains quite severe on day of discharge -No bleeding noted  -Dr. ShaAlen Blewd Dr. BenHaroldine Lawsscussed possible need for BMBx however he felt counts were stable and therefore therer was no need for BMBX at this point -Also need to consider if it may be related to Macitentan  Hypokalemia/hypomag -Supplemented   Diarrhea -Resolved   Consultants: Oncology   The patient was seen and examined by Dr. BenHaroldine Lawso feels that he is stable and ready for discharge today, 05/19/2019. Follow up appointment has been set up with HF team.  _____________  Discharge Vitals Blood pressure 120/82, pulse 74, temperature 97.6 F (36.4 C), temperature source Oral, resp. rate (!) 25, height 6' (1.829 m), weight 87.5 kg, SpO2 96 %.  Filed Weights   05/17/19 0437 05/18/19 0300 05/19/19 0605  Weight: 93.7 kg 89.6 kg 87.5 kg    Labs & Radiologic Studies    CBC Recent Labs    05/18/19 1143  WBC 2.2*  NEUTROABS 1.4*  HGB 9.9*  HCT 31.2*  MCV 104.7*  PLT 35*   Basic Metabolic Panel Recent Labs    05/18/19 0257 05/19/19 0250  NA 139 141  K 3.2* 3.4*  CL 108 109  CO2 22 21*  GLUCOSE 101* 109*  BUN 24* 21  CREATININE 1.99* 1.99*  CALCIUM 8.7* 8.3*  MG  --  1.5*   _____________  Dg Chest 2 View  Result Date: 05/16/2019 CLINICAL DATA:  68 year old current history of CHF, hypertension and coronary artery disease. Former smoker. EXAM: CHEST - 2 VIEW 6:38 p.m.: COMPARISON:  Two-view chest x-ray  earlier same day 9:22 a.m. and previously. FINDINGS: Cardiac silhouette moderately enlarged, unchanged. Thoracic aorta mildly tortuous and atherosclerotic. Enlarged central LEFT pulmonary artery, unchanged. Hilar and mediastinal contours otherwise unremarkable. Mild scarring in the lower lobes and RIGHT MIDDLE LOBE, unchanged. Lungs otherwise clear. Bronchovascular markings normal. Pulmonary vascularity normal without evidence of interstitial edema as questioned earlier. No visible pleural effusions. No pneumothorax. Visualized bony thorax intact. Implantable cardiac recording device overlies the LEFT chest. IMPRESSION: 1. Stable cardiomegaly. No acute cardiopulmonary disease. No evidence of pulmonary edema as questioned earlier today. 2. Stable mild scarring in the BILATERAL lower lobes and RIGHT middle lobe. Electronically Signed   By: Evangeline Dakin M.D.   On: 05/16/2019 19:02   Dg Chest 2 View  Result Date: 05/16/2019 CLINICAL DATA:  Bilateral leg swelling and shortness of breath. EXAM: CHEST - 2 VIEW COMPARISON:  February 13, 2019 FINDINGS: Loop recorder is again seen. The cardiac silhouette is enlarged. Minimal increase of the interstitial markings. No definite pleural effusions. Osseous structures are without acute abnormality. Soft tissues are grossly normal. IMPRESSION: 1. Enlarged cardiac silhouette. 2. Mild interstitial pulmonary edema. Electronically Signed   By: Fidela Salisbury M.D.   On: 05/16/2019 09:31   Disposition   Pt is being discharged home today in good condition.  Follow-up Plans & Appointments    Follow-up Information    , Shaune Pascal, MD. Go on 06/09/2019.   Specialty: Cardiology Why: 3:20 PM, parking code [phone] information: [address] [place] [address] [phone]          Discharge Instructions    ([place]) Call MD:  Anytime you have any of the following symptoms: 1) 3 pound weight gain in 24 hours or 5  pounds in 1 week 2) shortness of breath, with or without a dry hacking cough 3) swelling in the hands, feet or stomach 4) if you have to sleep on extra pillows at night in order to breathe.   Complete by: As directed    Call MD for:  difficulty breathing, headache or visual disturbances   Complete by: As directed    Call MD for:  extreme fatigue   Complete by: As directed    Call MD for:  hives   Complete by: As directed    Call MD for:  persistant dizziness or light-headedness   Complete by: As directed    Call MD for:  persistant nausea and vomiting   Complete by: As directed    Call MD for:  redness, tenderness, or signs of infection (pain, swelling, redness, odor or green/yellow discharge around incision site)   Complete by: As directed    Call MD for:  severe uncontrolled pain   Complete by: As directed    Call MD for:  temperature >100.4   Complete by: As directed    Diet - low  sodium heart healthy   Complete by: As directed    Increase activity slowly   Complete by: As directed      Discharge Medications   Allergies as of 05/19/2019      Reactions   Aspirin Hypertension   Nitroglycerin Other (See Comments)   Headache      Medication List    TAKE these medications   acetaminophen 500 MG tablet Commonly known as: TYLENOL Take 1,000 mg by mouth every 6 (six) hours as needed (for pain.).   Albuterol Sulfate 108 (90 Base) MCG/ACT Aepb Commonly known as: ProAir RespiClick Inhale 2 puffs into the lungs every 6 (six) hours as needed (SOB or wheezing). What changed: reasons to take this   allopurinol 100 MG tablet Commonly known as: ZYLOPRIM Take 2 tablets (200 mg total) by mouth daily.   Blood Pressure Cuff Misc Use as directed. Check blood pressure bid prn Dx:I10   colchicine 0.6 MG tablet Take 0.6 mg by mouth daily as needed (gout flare).   diphenoxylate-atropine 2.5-0.025 MG tablet Commonly known as: LOMOTIL Take 1 tablet by mouth 3 (three) times daily. What  changed: when to take this   doxepin 10 MG capsule Commonly known as: SINEQUAN TAKE 1-2 CAPSULES (10-20 MG TOTAL) BY MOUTH AT BEDTIME AS NEEDED. What changed: reasons to take this   Ferrous Fumarate 324 (106 Fe) MG Tabs tablet Commonly known as: Hemocyte Take 1 tablet (106 mg of iron total) by mouth daily.   fluticasone-salmeterol 230-21 MCG/ACT inhaler Commonly known as: ADVAIR HFA Inhale 2 puffs into the lungs every morning.   folic acid 1 MG tablet Commonly known as: FOLVITE Take 1 tablet (1 mg total) by mouth daily.   furosemide 40 MG tablet Commonly known as: LASIX Take 1 tablet (40 mg total) by mouth 2 (two) times daily. What changed: when to take this   hyoscyamine 0.125 MG SL tablet Commonly known as: LEVSIN SL Place 1 tablet (0.125 mg total) under the tongue every 6 (six) hours as needed. What changed: when to take this   metolazone 2.5 MG tablet Commonly known as: ZAROXOLYN Take 0.5-1 tablets (1.25-2.5 mg total) by mouth daily as needed (edema, weight gain>3#,). What changed: when to take this   Opsumit 10 MG tablet Generic drug: macitentan Take 1 tablet (10 mg total) by mouth daily.   pantoprazole 40 MG tablet Commonly known as: PROTONIX Take 1 tablet (40 mg total) by mouth daily before breakfast.   potassium chloride 10 MEQ tablet Commonly known as: K-DUR Take 2 tablets (20 mEq total) by mouth daily.   rosuvastatin 5 MG tablet Commonly known as: CRESTOR Take 1 tablet (5 mg total) by mouth daily.   Selexipag 1000 MCG Tabs Commonly known as: Uptravi Take 1,000 mcg by mouth 2 (two) times daily.   spironolactone 50 MG tablet Commonly known as: ALDACTONE TAKE 1 TABLET DAILY   tadalafil (PAH) 20 MG tablet Commonly known as: ADCIRCA TAKE 2 TABLETS (40 MG TOTAL) DAILY What changed: See the new instructions.   thiamine 100 MG tablet Take 1 tablet (100 mg total) by mouth daily.       Acute coronary syndrome (MI, NSTEMI, STEMI, etc) this  admission?: No.    Outstanding Labs/Studies   BMET  Duration of Discharge Encounter   Greater than 30 minutes including physician time.  Signed, Kathyrn Drown, NP 05/19/2019, 1:03 PM    Agree with above. He is ready for d/c. See my rounding note from earlier today for further  details. F/u arranged in HF Clinic with me.   Glori Bickers, MD  2:18 AM

## 2019-05-19 NOTE — Progress Notes (Signed)
    Received page regarding K+ and Mg+ levels this AM. Will replace with 41mq Kdur for K+ of 3.4 x1 and 2g Mag x1 for a Mg+ level of 1.5. Repeat BMET in AM.    JKathyrn DrownNP-C HFlemingsburgPager: [phone]

## 2019-05-19 NOTE — Progress Notes (Signed)
Advanced Heart Failure Rounding Note   Subjective:    Feels better this am. Still with some soft stool but improving.  Weight down another 5 pounds on home dose of torsemide. No SOB or orthopnea   k and MG supped this am   Objective:   Weight Range:  Vital Signs:   Temp:  [97.5 F (36.4 C)-98.1 F (36.7 C)] 97.6 F (36.4 C) (07/16 1041) Pulse Rate:  [69-80] 74 (07/16 0316) Resp:  [17-25] 25 (07/16 1041) BP: (110-127)/(61-82) 120/82 (07/16 1041) SpO2:  [96 %-100 %] 96 % (07/16 0757) Weight:  [87.5 kg] 87.5 kg (07/16 0605) Last BM Date: 05/18/19  Weight change: Filed Weights   05/17/19 0437 05/18/19 0300 05/19/19 0605  Weight: 93.7 kg 89.6 kg 87.5 kg    Intake/Output:   Intake/Output Summary (Last 24 hours) at 05/19/2019 1047 Last data filed at 05/19/2019 0316 Gross per 24 hour  Intake 698 ml  Output 975 ml  Net -277 ml     Physical Exam: General:  Well appearing. No resp difficulty HEENT: normal Neck: supple. JVp 6-7. Carotids 2+ bilat; no bruits. No lymphadenopathy or thryomegaly appreciated. Cor: PMI nondisplaced. Regular rate & rhythm. 2/6 TR loud P2  Lungs: clear Abdomen: soft, nontender, nondistended. No hepatosplenomegaly. No bruits or masses. Good bowel sounds. Extremities: no cyanosis, clubbing, rash, edema Neuro: alert & orientedx3, cranial nerves grossly intact. moves all 4 extremities w/o difficulty. Affect pleasant   Telemetry: Sinus 70s Personally reviewed   Labs: Basic Metabolic Panel: Recent Labs  Lab 05/16/19 0906 05/16/19 1051 05/17/19 0428 05/18/19 0257 05/19/19 0250  NA 137 138 139 139 141  K 3.7 3.8 3.6 3.2* 3.4*  CL 109 113* 109 108 109  CO2 19 16* 20* 22 21*  GLUCOSE 88 90 68* 101* 109*  BUN 28* 26* 24* 24* 21  CREATININE 1.79* 1.91* 2.09* 1.99* 1.99*  CALCIUM 8.3* 8.7* 8.5* 8.7* 8.3*  MG  --  1.9  --   --  1.5*    Liver Function Tests: Recent Labs  Lab 05/16/19 0906 05/16/19 1051  AST 37 45*  ALT 14 20    ALKPHOS 257* 261*  BILITOT 1.4* 1.7*  PROT 6.8 7.0  ALBUMIN 3.3* 3.1*   No results for input(s): LIPASE, AMYLASE in the last 168 hours. No results for input(s): AMMONIA in the last 168 hours.  CBC: Recent Labs  Lab 05/16/19 0906 05/16/19 1051 05/18/19 1143  WBC 1.8 Repeated and verified X2.* 1.8* 2.2*  NEUTROABS 1.0*  --  1.4*  HGB 9.5* 9.6* 9.9*  HCT 29.5* 31.3* 31.2*  MCV 104.1* 107.6* 104.7*  PLT 33.0 Repeated and verified X2.* 39* 35*    Cardiac Enzymes: No results for input(s): CKTOTAL, CKMB, CKMBINDEX, TROPONINI in the last 168 hours.  BNP: BNP (last 3 results) Recent Labs    07/22/18 1255 02/12/19 2356 05/16/19 1051  BNP 264.2* 158.1* 1,119.2*    ProBNP (last 3 results) Recent Labs    09/22/18 1405 09/27/18 0913 05/16/19 0906  PROBNP 859.0* 415.0* 1,293.0*      Other results:  Imaging: No results found.   Medications:     Scheduled Medications:  allopurinol  200 mg Oral Daily   enoxaparin (LOVENOX) injection  40 mg Subcutaneous Q24H   Ferrous Fumarate  1 tablet Oral Daily   folic acid  1 mg Oral Daily   hyoscyamine  0.125 mg Sublingual Daily   macitentan  10 mg Oral Daily   mometasone-formoterol  2 puff  Inhalation BID   pantoprazole  40 mg Oral QAC breakfast   rosuvastatin  5 mg Oral Daily   Selexipag  1,000 mcg Oral BID   sodium chloride flush  3 mL Intravenous Q12H   spironolactone  50 mg Oral Daily   tadalafil (PAH)  40 mg Oral Daily   thiamine  100 mg Oral Daily    Infusions:  sodium chloride     magnesium sulfate bolus IVPB 4 g (05/19/19 1007)    PRN Medications: sodium chloride, acetaminophen, albuterol, ondansetron (ZOFRAN) IV, sodium chloride flush   Assessment/Plan:   1. Acute on chronic diastolic HF - Volume status much improved. Now well below baseline weight.  - Can go home today on previous diuretic regimen.  - Repeat echo showed normal LV function with severely dilated RV with moderate RV  dysfunction RVSP 52mHG   2. PAH: Suspected portopulmonary HTN in setting of ETOH cirrhosis. - On triple therapy with NYHA III symptoms at baseline.6MW 9/19 1280 feet (390 meters) - Echo 9/19 with EF 60-65% RV markedly dilated and hypokinetic with D-shaped septum - Continue selexipag 1000 mcg twice a day. Intolerant higher dose due to diarrhea and headaches .  - Continue macitentan 10 daily and adcirca 40 mg daily. - With severe RV strain on echo may need to consider IV therapies in the near future but with comorbidities may not be candidate. Will discuss this further as outpatient  3. Acute on CKD III:  -Baseline creatinine 1.5-2.0 - Creatinine 1.9-> 2.1 -> 1.99  -> 1.99 - Followed by Dr. CMarval Regal  4. Cirrhosis with small esoph varices: - Likely combination of RV failure and ETOH - Follows with Dr. JArdis Hughs- Did not tolerate nadolol. - EGD 4/20 stable - Discussed importance of eliminating all alcohol intake.  5. Recurrent syncope/seizure - LINQ interrogations have shownno arrhythmias and thus most likely seizure - Continue Keppra. Follows with Dr. ADelice Lesch 6. OSA:  - Has mild OSA with AHI 12 and desats down to 82%.  - Intolerant CPAP.   7. Pancytopenia:  - Has seen Dr. SAlen Blewin Hematology - felt like it is due to splenic sequestration in setting of cirrhosis. - remains quite severe today. No bleeding - I d/w Dr. SAlen Blewregarding need for BMBx and he feels counts are stable and no need for BMBX at this point.  - Also need to consider if it may be related to Macitentan  8. Hypokalemia/hypomag - have been supped  9. Diarrhea -resolving  Home today. F/u HF clinic 3-4 weeks. Will arrange.   Length of Stay: 3   DGlori BickersMD 05/19/2019, 10:47 AM  Advanced Heart Failure Team Pager [phone](M-F; 7[place]  Please contact [facility] for night-coverage after hours (4p -7a ) and weekends on [email or web address]

## 2019-05-19 NOTE — Progress Notes (Signed)
Discharge instructions reviewed, questions answered.  Home medications picked up from pharmacy and given to patient.  Patient via wheelchair to son's car and driven home

## 2019-05-19 NOTE — TOC Transition Note (Signed)
Transition of Care [facility]) - CM/SW Discharge Note   Patient Details  Name: Darren Mueller MRN: [id number] Date of Birth: 09/02/1951  Transition of [facility] CM/SW Contact:  Candie Chroman, LCSW Phone Number: 05/19/2019, 1:30 PM   Clinical Narrative:  Patient has orders to discharge home today. No further concerns. CSW signing off.   Final next level of care: Home/Self Care Barriers to Discharge: Barriers Resolved   Patient Goals and CMS Choice Patient states their goals for this hospitalization and ongoing recovery are:: Return home      Discharge Placement                    Patient and family notified of of transfer: 05/19/19  Discharge Plan and Services                                     Social Determinants of Health (SDOH) Interventions     Readmission Risk Interventions Readmission Risk Prevention Plan 05/17/2019 02/14/2019  Transportation Screening Complete Complete  PCP or Specialist Appt within 3-5 Days Complete Not Complete  Not Complete comments - CM had to leave VM for office staff - requesting appt  [place] or [place] Complete Complete  Social Work Consult for [place] Planning/Counseling Complete Complete  Palliative Care Screening Not Applicable Not Applicable  Medication Review (RN Care Manager) Referral to Pharmacy Referral to Pharmacy  Some recent data might be hidden

## 2019-05-22 LAB — STOOL CULTURE REFLEX - CMPCXR

## 2019-05-22 LAB — STOOL CULTURE REFLEX - RSASHR

## 2019-05-22 LAB — STOOL CULTURE: E coli, Shiga toxin Assay: NEGATIVE

## 2019-05-26 NOTE — Progress Notes (Signed)
[facility] Summary Report / Loop Recorder

## 2019-05-27 ENCOUNTER — Other Ambulatory Visit (HOSPITAL_COMMUNITY): Payer: Self-pay

## 2019-05-27 MED ORDER — UPTRAVI 1000 MCG PO TABS: 1000.0000 ug | ORAL_TABLET | Freq: Two times a day (BID) | ORAL | 11 refills | Status: DC

## 2019-06-02 ENCOUNTER — Ambulatory Visit (INDEPENDENT_AMBULATORY_CARE_PROVIDER_SITE_OTHER): Payer: Medicare Other | Admitting: Family Medicine

## 2019-06-02 ENCOUNTER — Telehealth: Payer: Self-pay

## 2019-06-02 ENCOUNTER — Other Ambulatory Visit: Payer: Self-pay

## 2019-06-02 VITALS — BP 126/68 | HR 92 | Temp 98.2°F | Resp 18 | Wt 211.0 lb

## 2019-06-02 DIAGNOSIS — D696 Thrombocytopenia, unspecified: Secondary | ICD-10-CM

## 2019-06-02 DIAGNOSIS — N183 Chronic kidney disease, stage 3 unspecified: Secondary | ICD-10-CM

## 2019-06-02 DIAGNOSIS — E876 Hypokalemia: Secondary | ICD-10-CM | POA: Diagnosis not present

## 2019-06-02 DIAGNOSIS — R790 Abnormal level of blood mineral: Secondary | ICD-10-CM

## 2019-06-02 DIAGNOSIS — I50813 Acute on chronic right heart failure: Secondary | ICD-10-CM

## 2019-06-02 DIAGNOSIS — D649 Anemia, unspecified: Secondary | ICD-10-CM | POA: Diagnosis not present

## 2019-06-02 DIAGNOSIS — E785 Hyperlipidemia, unspecified: Secondary | ICD-10-CM | POA: Diagnosis not present

## 2019-06-02 DIAGNOSIS — I10 Essential (primary) hypertension: Secondary | ICD-10-CM | POA: Diagnosis not present

## 2019-06-02 DIAGNOSIS — R197 Diarrhea, unspecified: Secondary | ICD-10-CM | POA: Diagnosis not present

## 2019-06-02 DIAGNOSIS — K7030 Alcoholic cirrhosis of liver without ascites: Secondary | ICD-10-CM | POA: Diagnosis not present

## 2019-06-02 LAB — CBC
HCT: 31.1 % — ABNORMAL LOW (ref 39.0–52.0)
Hemoglobin: 10.1 g/dL — ABNORMAL LOW (ref 13.0–17.0)
MCHC: 32.4 g/dL (ref 30.0–36.0)
MCV: 104.7 fl — ABNORMAL HIGH (ref 78.0–100.0)
Platelets: 26 10*3/uL — CL (ref 150.0–400.0)
RBC: 2.97 Mil/uL — ABNORMAL LOW (ref 4.22–5.81)
RDW: 19.6 % — ABNORMAL HIGH (ref 11.5–15.5)
WBC: 1.4 10*3/uL — CL (ref 4.0–10.5)

## 2019-06-02 LAB — COMPREHENSIVE METABOLIC PANEL
ALT: 18 U/L (ref 0–53)
AST: 63 U/L — ABNORMAL HIGH (ref 0–37)
Albumin: 3.4 g/dL — ABNORMAL LOW (ref 3.5–5.2)
Alkaline Phosphatase: 354 U/L — ABNORMAL HIGH (ref 39–117)
BUN: 32 mg/dL — ABNORMAL HIGH (ref 6–23)
CO2: 22 mEq/L (ref 19–32)
Calcium: 8.5 mg/dL (ref 8.4–10.5)
Chloride: 108 mEq/L (ref 96–112)
Creatinine, Ser: 1.72 mg/dL — ABNORMAL HIGH (ref 0.40–1.50)
GFR: 47.9 mL/min — ABNORMAL LOW (ref >60.00)
Glucose, Bld: 107 mg/dL — ABNORMAL HIGH (ref 70–99)
Potassium: 3.7 mEq/L (ref 3.5–5.1)
Sodium: 138 mEq/L (ref 135–145)
Total Bilirubin: 1.6 mg/dL — ABNORMAL HIGH (ref 0.2–1.2)
Total Protein: 7 g/dL (ref 6.0–8.3)

## 2019-06-02 LAB — TSH: TSH: 2.5 u[IU]/mL (ref 0.35–4.50)

## 2019-06-02 LAB — VITAMIN D 25 HYDROXY (VIT D DEFICIENCY, FRACTURES): VITD: 52.91 ng/mL (ref 30.00–100.00)

## 2019-06-02 LAB — MAGNESIUM: Magnesium: 1.7 mg/dL (ref 1.5–2.5)

## 2019-06-02 NOTE — Telephone Encounter (Signed)
Patient seen in clinic and and no signs of bleeding. He does have an appt with his oncologist next month but since his numbers continue to drop will forward these numbers to his oncologist Dr Alen Blew for review in the interim. Please notify patient to seek care if he sees any signs of bleeding

## 2019-06-02 NOTE — Telephone Encounter (Signed)
Spoke with patient about critical lab results he voiced his understanding

## 2019-06-02 NOTE — Telephone Encounter (Signed)
Labs noted. Thanks

## 2019-06-02 NOTE — Patient Instructions (Signed)
Shingrix is the new shingles shot. 2 shots over 2-6 months [facility] or [facility]  Citracal daily Cirrhosis  Cirrhosis is long-term (chronic) liver injury. The liver is the body's largest internal organ, and it performs many functions. It converts food into energy, removes toxic material from the blood, makes important proteins, and absorbs necessary vitamins from food. In cirrhosis, healthy liver cells are replaced by scar tissue. This prevents blood from flowing through the liver, making it difficult for the liver to function. Scarring of the liver cannot be reversed, but treatment can prevent it from getting worse. What are the causes? Common causes of this condition are hepatitis C and long-term alcohol abuse. Other causes include:  Nonalcoholic fatty liver disease. This happens when fat is deposited in the liver by causes other than alcohol.  Hepatitis B infection.  Autoimmune hepatitis. In this condition, the body's defense system (immune system) mistakenly attacks the liver cells, causing irritation and swelling (inflammation).  Diseases that cause blockage of ducts inside the liver.  Inherited liver diseases, such as hemochromatosis. This is one of the most common inherited liver diseases. In this disease, deposits of iron collect in the liver and other organs.  Reactions to certain long-term medicines, such as amiodarone, a heart medicine.  Parasitic infections. These include schistosomiasis, which is caused by a flatworm.  Long-term contact to certain toxins. These toxins include certain organic solvents, such as toluene and chloroform. What increases the risk? You are more likely to develop this condition if:  You have certain types of viral hepatitis.  You abuse alcohol, especially if you are male.  You are overweight.  You share needles.  You have unprotected sex with someone who has viral hepatitis. What are the signs or symptoms? You may not have any signs and  symptoms at first. Symptoms may not develop until the damage to your liver starts to get worse. Early symptoms may include:  Weakness and tiredness (fatigue).  Changes in sleep patterns or having trouble sleeping.  Itchiness.  Tenderness in the right-upper part of your abdomen.  Weight loss and muscle loss.  Nausea.  Loss of appetite.  Appearance of tiny blood vessels under the skin. Later symptoms may include:  Fatigue or weakness that is getting worse.  Yellow skin and eyes (jaundice).  Buildup of fluid in the abdomen (ascites). You may notice that your clothes are tight around your waist.  Weight gain.  Swelling of the feet and ankles (edema).  Trouble breathing.  Easy bruising and bleeding.  Vomiting blood.  Black or bloody stool.  Mental confusion. How is this diagnosed? Your health care provider may suspect cirrhosis based on your symptoms and medical history, especially if you have other medical conditions or a history of alcohol abuse. Your health care provider will do a physical exam to feel your liver and to check for signs of cirrhosis. He or she may perform other tests, including:  Blood tests to check: ? For hepatitis B or C. ? Kidney function. ? Liver function.  Imaging tests such as: ? MRI or CT scan to look for changes seen in advanced cirrhosis. ? Ultrasound to see if normal liver tissue is being replaced by scar tissue.  A procedure in which a long needle is used to take a sample of liver tissue to be checked in a lab (biopsy). Liver biopsy can confirm the diagnosis of cirrhosis. How is this treated? Treatment for this condition depends on how damaged your liver is and what caused  the damage. It may include treating the symptoms of cirrhosis, or treating the underlying causes in order to slow the damage. Treatment may include:  Making lifestyle changes, such as: ? Eating a healthy diet. You may need to work with your health care provider or a  diet and nutrition specialist (dietitian) to develop an eating plan. ? Restricting salt intake. ? Maintaining a healthy weight. ? Not abusing drugs or alcohol.  Taking medicines to: ? Treat liver infections or other infections. ? Control itching. ? Reduce fluid buildup. ? Reduce certain blood toxins. ? Reduce risk of bleeding from enlarged blood vessels in the stomach or esophagus (varices).  Liver transplant. In this procedure, a liver from a donor is used to replace your diseased liver. This is done if cirrhosis has caused liver failure. Other treatments and procedures may be done depending on the problems that you get from cirrhosis. Common problems include liver-related kidney failure (hepatorenal syndrome). Follow these instructions at home:   Take medicines only as told by your health care provider. Do not use medicines that are toxic to your liver. Ask your health care provider before taking any new medicines, including over-the-counter medicines.  Rest as needed.  Eat a well-balanced diet. Ask your health care provider or dietitian for more information.  Limit your salt or water intake, if your health care provider asks you to do this.  Do not drink alcohol. This is especially important if you are taking acetaminophen.  Keep all follow-up visits as told by your health care provider. This is important. Contact a health care provider if you:  Have fatigue or weakness that is getting worse.  Develop swelling of the hands, feet, legs, or face.  Have a fever.  Develop loss of appetite.  Have nausea or vomiting.  Develop jaundice.  Develop easy bruising or bleeding. Get help right away if you:  Vomit bright red blood or a material that looks like coffee grounds.  Have blood in your stools.  Notice that your stools appear black and tarry.  Become confused.  Have chest pain or trouble breathing. Summary  Cirrhosis is chronic liver injury. Liver damage cannot be  reversed. Common causes are hepatitis C and long-term alcohol abuse.  Tests used to diagnose cirrhosis include blood tests, imaging tests, and liver biopsy.  Treatment for this condition involves treating the underlying cause. Avoid alcohol, drugs, salt, and medicines that may damage your liver.  Contact your health care provider if you develop ascites, edema, jaundice, fever, nausea or vomiting, easy bruising or bleeding, or worsening fatigue. This information is not intended to replace advice given to you by your health care provider. Make sure you discuss any questions you have with your health care provider. Document Released: 10/20/2005 Document Revised: 02/09/2019 Document Reviewed: 09/09/2017 Elsevier Patient Education  2020 [employer].

## 2019-06-02 NOTE — Telephone Encounter (Signed)
[facility] called- critical labs:   WBC: 1.4 Platelet: 26,000  Dr. Charlett Blake verbally informed.

## 2019-06-03 ENCOUNTER — Other Ambulatory Visit (HOSPITAL_COMMUNITY): Payer: Self-pay

## 2019-06-03 MED ORDER — UPTRAVI 1000 MCG PO TABS: 1000.0000 ug | ORAL_TABLET | Freq: Two times a day (BID) | ORAL | 11 refills | Status: AC

## 2019-06-05 NOTE — Assessment & Plan Note (Signed)
Tolerating statin, encouraged heart healthy diet, avoid trans fats, minimize simple carbs and saturated fats. Increase exercise as tolerated 

## 2019-06-05 NOTE — Assessment & Plan Note (Signed)
Continues with daily symptoms as a side effect of his medications but he finds it manageable.

## 2019-06-05 NOTE — Assessment & Plan Note (Signed)
Continues to be asymptomatic but worsening. He has an appt with hematology soon and have forwarded labs for review. Patient will seek care if he sees any signs of bleeding. He has cut back on his alcohol consumption significantly and is down to just 2 beverages a couple of times a week but he is advised again to quit completely he expresses understanding.

## 2019-06-05 NOTE — Assessment & Plan Note (Signed)
Has an appointment with nephrology this month. Hydrate well and attempt alcohol cessation.

## 2019-06-05 NOTE — Assessment & Plan Note (Signed)
Has weight gain again at visit. He is advised to double his lasix for next 5 days then return to baseline. Weigh himself daily and if his weight does not come down he needs to call the CHF clinic for further instructions.

## 2019-06-05 NOTE — Assessment & Plan Note (Signed)
Well controlled, no changes to meds. Encouraged heart healthy diet such as the DASH diet and exercise as tolerated.

## 2019-06-05 NOTE — Progress Notes (Signed)
Subjective:    Patient ID: Darren Mueller, male    DOB: 12/07/1950, 68 y.o.   MRN: [id number]  No chief complaint on file.   HPI Patient is in today for Hospital follow up after he was admitted with a congestive heart failure exacerbation. They were able to bring his weight and edema down and while he continues to feel some better than he did prior to going to the hospital he is noting increasing weight, edema and SOB again. No signs of bleeding. No recent febrile illness or other acute concerns. Denies CP/palp/HA/congestion/fevers/GI or GU c/o. Taking meds as prescribed. He is down to just 2 alcohol containing beverages a few times a week.   Past Medical History:  Diagnosis Date  . Alcohol dependence ([place]) 03/22/2011   In remission   . Alcohol dependence in remission ([place]) 03/22/2011   In remission   . Alcoholic cirrhosis of liver with ascites ([place]) 2014  . Anginal pain ([place])    with SOB admitted 04/20/2019  . Cardiomegaly 02/2019   Noted on CXR  . CHF (congestive heart failure) ([place])   . CKD (chronic kidney disease), stage III ([place]) 07/18/2013  . COPD (chronic obstructive pulmonary disease) ([place])   . Coronary artery disease   . Dermatitis 06/10/2015  . Diarrhea 09/04/2013  . Dyspnea    admitted 04/20/2019  . Elbow pain, left 03/17/2017  . Eustachian tube dysfunction   . GERD (gastroesophageal reflux disease)   . Gout   . Headache   . Hearing loss   . Heart murmur   . Hx of colonic polyps   . Hypertension   . Hypertriglyceridemia 03/17/2017  . Hypopotassemia   . Otalgia 11/28/2013  . Other chronic pulmonary heart diseases   . PAH (pulmonary artery hypertension) ([facility])    RV failure  . Pancytopenia ([place])   . Pleural effusion 2014   Noted on CT:   Moderately large simple layering right pleural effusion   . Preventative health care 06/29/2016  . Pulmonary nodule 2014   Noted on CT: An 8 mm pulmonary nodule in the periphery of the left lower lobe  . Rectal bleeding   . Red eye  03/03/2016   right  . Seizures ([place])   . Sleep apnea    does not wear CPAP  . Splenomegaly 12/2015   Noted on CT  . Thoracic aorta atherosclerosis ([place]) 12/2017  . Thrombocytopenia ([place]) 06/29/2016  . Unspecified hypothyroidism 01/25/2013  . Unspecified pleural effusion   . Vertigo   . Wears glasses     Past Surgical History:  Procedure Laterality Date  . CARDIAC CATHETERIZATION N/A 12/21/2015   Procedure: Right Heart Cath;  Surgeon: Jolaine Artist, MD;  Location: [facility];  Service: Cardiovascular;  Laterality: N/A;  . CARDIAC CATHETERIZATION N/A 04/15/2016   Procedure: Right/Left Heart Cath and Coronary Angiography;  Surgeon: Jolaine Artist, MD;  Location: [facility];  Service: Cardiovascular;  Laterality: N/A;  . COLONOSCOPY N/A 12/08/2013   Procedure: COLONOSCOPY;  Surgeon: Milus Banister, MD;  Location: [facility];  Service: Endoscopy;  Laterality: N/A;  . COLONOSCOPY WITH PROPOFOL N/A 03/24/2019   Procedure: COLONOSCOPY WITH PROPOFOL;  Surgeon: Milus Banister, MD;  Location: [facility];  Service: Endoscopy;  Laterality: N/A;  Draw CBC in preop  . COLONOSCOPY WITH PROPOFOL N/A 04/28/2019   Procedure: COLONOSCOPY WITH PROPOFOL;  Surgeon: Milus Banister, MD;  Location: [facility];  Service: Endoscopy;  Laterality: N/A;  .  ESOPHAGOGASTRODUODENOSCOPY (EGD) WITH PROPOFOL N/A 03/27/2016   Procedure: ESOPHAGOGASTRODUODENOSCOPY (EGD) WITH PROPOFOL;  Surgeon: Milus Banister, MD;  Location: [facility];  Service: Endoscopy;  Laterality: N/A;  . ESOPHAGOGASTRODUODENOSCOPY (EGD) WITH PROPOFOL N/A 02/14/2019   Procedure: ESOPHAGOGASTRODUODENOSCOPY (EGD) WITH PROPOFOL;  Surgeon: Jerene Bears, MD;  Location: [facility] ENDOSCOPY;  Service: Gastroenterology;  Laterality: N/A;  . EYE SURGERY Bilateral 03/15/2019   lasix  . HEMOSTASIS CLIP PLACEMENT  04/28/2019   Procedure: HEMOSTASIS CLIP PLACEMENT;  Surgeon: Milus Banister, MD;  Location: [facility];  Service:  Endoscopy;;  . LOOP RECORDER INSERTION N/A 01/01/2017   Procedure: Loop Recorder Insertion;  Surgeon: Thompson Grayer, MD;  Location: [facility];  Service: Cardiovascular;  Laterality: N/A;  . POLYPECTOMY  04/28/2019   Procedure: POLYPECTOMY;  Surgeon: Milus Banister, MD;  Location: [facility];  Service: Endoscopy;;  . RIGHT HEART CATHETERIZATION Right 06/06/2014   Procedure: RIGHT HEART CATH;  Surgeon: Jolaine Artist, MD;  Location: [facility] LAB;  Service: Cardiovascular;  Laterality: Right;  . UVULOPALATOPHARYNGOPLASTY  1999    Family History  Problem Relation Age of Onset  . Heart disease Mother   . Heart attack Mother   . Hypertension Mother   . Kidney failure Mother   . Liver disease Mother        stage 4 liver disease  . Prostate cancer Father   . Cancer Father        lung  . Hypertension Brother   . Gout Brother   . Alcoholism Maternal Grandfather   . Liver disease Maternal Grandfather   . Migraines Sister   . Hypertension Brother   . Colon cancer Neg Hx     Social History   Socioeconomic History  . Marital status: Divorced    Spouse name: Not on file  . Number of children: 3  . Years of education: Not on file  . Highest education level: Not on file  Occupational History  . Occupation: Retired    Comment: Microbiologist  . Financial resource strain: Not hard at all  . Food insecurity    Worry: Never true    Inability: Never true  . Transportation needs    Medical: No    Non-medical: No  Tobacco Use  . Smoking status: Former Smoker    Packs/day: 2.00    Years: 5.00    Pack years: 10.00    Types: Cigarettes    Quit date: 09/22/1979    Years since quitting: 39.7  . Smokeless tobacco: Never Used  Substance and Sexual Activity  . Alcohol use: Yes    Alcohol/week: 6.0 standard drinks    Types: 6 Shots of liquor per week    Comment: social drinker  . Drug use: No  . Sexual activity: Yes    Birth control/protection: Spermicide   Lifestyle  . Physical activity    Days per week: 0 days    Minutes per session: 0 min  . Stress: Rather much  Relationships  . Social connections    Talks on phone: More than three times a week    Gets together: Three times a week    Attends religious service: More than 4 times per year    Active member of club or organization: Yes    Attends meetings of clubs or organizations: More than 4 times per year    Relationship status: Divorced  . Intimate partner violence    Fear of current or ex partner:  Not on file    Emotionally abused: Not on file    Physically abused: Not on file    Forced sexual activity: Not on file  Other Topics Concern  . Not on file  Social History Narrative   0 caffeine drinks daily     Outpatient Medications Prior to Visit  Medication Sig Dispense Refill  . acetaminophen (TYLENOL) 500 MG tablet Take 1,000 mg by mouth every 6 (six) hours as needed (for pain.).    Marland Kitchen Albuterol Sulfate (PROAIR RESPICLICK) 295 (90 Base) MCG/ACT AEPB Inhale 2 puffs into the lungs every 6 (six) hours as needed (SOB or wheezing). (Patient taking differently: Inhale 2 puffs into the lungs every 6 (six) hours as needed (shortness of breath). ) 3 each 1  . allopurinol (ZYLOPRIM) 100 MG tablet Take 2 tablets (200 mg total) by mouth daily. 180 tablet 1  . Blood Pressure Monitoring (BLOOD PRESSURE CUFF) MISC Use as directed. Check blood pressure bid prn Dx:I10 1 each 0  . colchicine 0.6 MG tablet Take 0.6 mg by mouth daily as needed (gout flare).    . diphenoxylate-atropine (LOMOTIL) 2.5-0.025 MG tablet Take 1 tablet by mouth 3 (three) times daily. (Patient taking differently: Take 1 tablet by mouth daily. ) 60 tablet 0  . doxepin (SINEQUAN) 10 MG capsule TAKE 1-2 CAPSULES (10-20 MG TOTAL) BY MOUTH AT BEDTIME AS NEEDED. (Patient taking differently: Take 10-20 mg by mouth at bedtime as needed (sleep). ) 60 capsule 5  . Ferrous Fumarate (HEMOCYTE) 324 (106 Fe) MG TABS tablet Take 1 tablet (106  mg of iron total) by mouth daily. 30 tablet 5  . fluticasone-salmeterol (ADVAIR HFA) 230-21 MCG/ACT inhaler Inhale 2 puffs into the lungs every morning. 48 g 1  . folic acid (FOLVITE) 1 MG tablet Take 1 tablet (1 mg total) by mouth daily. 30 tablet 5  . furosemide (LASIX) 40 MG tablet Take 1 tablet (40 mg total) by mouth 2 (two) times daily. (Patient taking differently: Take 40 mg by mouth at bedtime. ) 60 tablet 3  . hyoscyamine (LEVSIN SL) 0.125 MG SL tablet Place 1 tablet (0.125 mg total) under the tongue every 6 (six) hours as needed. (Patient taking differently: Place 0.125 mg under the tongue daily. ) 90 tablet 3  . macitentan (OPSUMIT) 10 MG tablet Take 1 tablet (10 mg total) by mouth daily. 90 tablet 0  . metolazone (ZAROXOLYN) 2.5 MG tablet Take 0.5-1 tablets (1.25-2.5 mg total) by mouth daily as needed (edema, weight gain>3#,). (Patient taking differently: Take 1.25-2.5 mg by mouth at bedtime. ) 20 tablet 0  . pantoprazole (PROTONIX) 40 MG tablet Take 1 tablet (40 mg total) by mouth daily before breakfast. 30 tablet 0  . potassium chloride (K-DUR) 10 MEQ tablet Take 2 tablets (20 mEq total) by mouth daily. 60 tablet 3  . rosuvastatin (CRESTOR) 5 MG tablet Take 1 tablet (5 mg total) by mouth daily. 90 tablet 3  . spironolactone (ALDACTONE) 50 MG tablet TAKE 1 TABLET DAILY (Patient taking differently: Take 50 mg by mouth daily. ) 90 tablet 1  . tadalafil, PAH, (ADCIRCA) 20 MG tablet TAKE 2 TABLETS (40 MG TOTAL) DAILY (Patient taking differently: Take 40 mg by mouth daily. ) 60 tablet 12  . thiamine 100 MG tablet Take 1 tablet (100 mg total) by mouth daily. 30 tablet 5  . Selexipag (UPTRAVI) 1000 MCG TABS Take 1,000 mcg by mouth 2 (two) times daily. 60 tablet 11   No facility-administered medications prior to  visit.     Allergies  Allergen Reactions  . Aspirin Hypertension  . Nitroglycerin Other (See Comments)    Headache    Review of Systems  Constitutional: Positive for  malaise/fatigue. Negative for fever.  HENT: Negative for congestion.   Eyes: Negative for blurred vision.  Respiratory: Positive for shortness of breath.   Cardiovascular: Positive for leg swelling. Negative for chest pain and palpitations.  Gastrointestinal: Positive for diarrhea. Negative for abdominal pain, blood in stool and nausea.  Genitourinary: Negative for dysuria and frequency.  Musculoskeletal: Positive for myalgias. Negative for falls.  Skin: Negative for rash.  Neurological: Negative for dizziness, loss of consciousness and headaches.  Endo/Heme/Allergies: Negative for environmental allergies.  Psychiatric/Behavioral: Negative for depression. The patient is nervous/anxious.        Objective:    Physical Exam Vitals signs and nursing note reviewed.  Constitutional:      General: He is not in acute distress.    Appearance: He is well-developed.  HENT:     Head: Normocephalic and atraumatic.     Nose: Nose normal.  Eyes:     General:        Right eye: No discharge.        Left eye: No discharge.  Neck:     Musculoskeletal: Normal range of motion and neck supple.  Cardiovascular:     Rate and Rhythm: Normal rate and regular rhythm.     Heart sounds: No murmur.  Pulmonary:     Effort: Pulmonary effort is normal.     Breath sounds: Normal breath sounds.  Abdominal:     General: Bowel sounds are normal.     Palpations: Abdomen is soft.     Tenderness: There is no abdominal tenderness.  Musculoskeletal:     Right lower leg: Edema present.     Left lower leg: Edema present.  Skin:    General: Skin is warm and dry.  Neurological:     Mental Status: He is alert and oriented to person, place, and time.     BP 126/68 (BP Location: Left Arm, Patient Position: Sitting, Cuff Size: Normal)   Pulse 92   Temp 98.2 F (36.8 C) (Oral)   Resp 18   Wt 211 lb (95.7 kg)   SpO2 95%   BMI 28.62 kg/m  Wt Readings from Last 3 Encounters:  06/02/19 211 lb (95.7 kg)   05/19/19 192 lb 14.4 oz (87.5 kg)  05/16/19 220 lb 3.2 oz (99.9 kg)    Diabetic Foot Exam - Simple   No data filed     Lab Results  Component Value Date   WBC 1.4 Repeated and verified X2. (LL) 06/02/2019   HGB 10.1 (L) 06/02/2019   HCT 31.1 (L) 06/02/2019   PLT 26.0 Repeated and verified X2. (LL) 06/02/2019   GLUCOSE 107 (H) 06/02/2019   CHOL 124 04/13/2019   TRIG 83.0 04/13/2019   HDL 55.90 04/13/2019   LDLDIRECT 34.0 05/04/2018   LDLCALC 51 04/13/2019   ALT 18 06/02/2019   AST 63 (H) 06/02/2019   NA 138 06/02/2019   K 3.7 06/02/2019   CL 108 06/02/2019   CREATININE 1.72 (H) 06/02/2019   BUN 32 (H) 06/02/2019   CO2 22 06/02/2019   TSH 2.50 06/02/2019   PSA 0.49 11/01/2012   INR 1.2 05/16/2019   HGBA1C 4.8 04/13/2019    Lab Results  Component Value Date   TSH 2.50 06/02/2019   Lab Results  Component Value Date  WBC 1.4 Repeated and verified X2. (LL) 06/02/2019   HGB 10.1 (L) 06/02/2019   HCT 31.1 (L) 06/02/2019   MCV 104.7 (H) 06/02/2019   PLT 26.0 Repeated and verified X2. (LL) 06/02/2019   Lab Results  Component Value Date   NA 138 06/02/2019   K 3.7 06/02/2019   CO2 22 06/02/2019   GLUCOSE 107 (H) 06/02/2019   BUN 32 (H) 06/02/2019   CREATININE 1.72 (H) 06/02/2019   BILITOT 1.6 (H) 06/02/2019   ALKPHOS 354 (H) 06/02/2019   AST 63 (H) 06/02/2019   ALT 18 06/02/2019   PROT 7.0 06/02/2019   ALBUMIN 3.4 (L) 06/02/2019   CALCIUM 8.5 06/02/2019   ANIONGAP 11 05/19/2019   GFR 47.90 (L) 06/02/2019   Lab Results  Component Value Date   CHOL 124 04/13/2019   Lab Results  Component Value Date   HDL 55.90 04/13/2019   Lab Results  Component Value Date   LDLCALC 51 04/13/2019   Lab Results  Component Value Date   TRIG 83.0 04/13/2019   Lab Results  Component Value Date   CHOLHDL 2 04/13/2019   Lab Results  Component Value Date   HGBA1C 4.8 04/13/2019       Assessment & Plan:   Problem List Items Addressed This Visit    Hypertension  - Primary (Chronic)    Well controlled, no changes to meds. Encouraged heart healthy diet such as the DASH diet and exercise as tolerated.       Relevant Orders   Comprehensive metabolic panel (Completed)   TSH (Completed)   Chronic renal insufficiency, stage III (moderate) (HCC) (Chronic)    Has an appointment with nephrology this month. Hydrate well and attempt alcohol cessation.       Cirrhosis with alcoholism ([place]) (Chronic)   Relevant Orders   CBC (Completed)   Hypokalemia   Diarrhea    Continues with daily symptoms as a side effect of his medications but he finds it manageable.      Thrombocytopenia ([place])    Continues to be asymptomatic but worsening. He has an appt with hematology soon and have forwarded labs for review. Patient will seek care if he sees any signs of bleeding. He has cut back on his alcohol consumption significantly and is down to just 2 beverages a couple of times a week but he is advised again to quit completely he expresses understanding.       Hyperlipidemia    Tolerating statin, encouraged heart healthy diet, avoid trans fats, minimize simple carbs and saturated fats. Increase exercise as tolerated      Acute on chronic right heart failure ([place])    Has weight gain again at visit. He is advised to double his lasix for next 5 days then return to baseline. Weigh himself daily and if his weight does not come down he needs to call the CHF clinic for further instructions.        Other Visit Diagnoses    Low magnesium level       Relevant Orders   Magnesium (Completed)   Hypocalcemia       Relevant Orders   Comprehensive metabolic panel (Completed)   VITAMIN D 25 Hydroxy (Vit-D Deficiency, Fractures) (Completed)   Anemia, unspecified type          I am having [name] "Col" maintain his spironolactone, allopurinol, tadalafil (PAH), diphenoxylate-atropine, Albuterol Sulfate, fluticasone-salmeterol, hyoscyamine, rosuvastatin, potassium  chloride, pantoprazole, Blood Pressure Cuff, furosemide, acetaminophen, doxepin, colchicine, Opsumit, metolazone,  Ferrous Fumarate, folic acid, and thiamine.  No orders of the defined types were placed in this encounter.    Penni Homans, MD

## 2019-06-09 ENCOUNTER — Encounter (HOSPITAL_COMMUNITY): Payer: Self-pay | Admitting: Internal Medicine

## 2019-06-09 ENCOUNTER — Other Ambulatory Visit: Payer: Self-pay

## 2019-06-09 ENCOUNTER — Ambulatory Visit (HOSPITAL_COMMUNITY)
Admit: 2019-06-09 | Discharge: 2019-06-09 | Disposition: A | Discharge status: Home / Self Care | Payer: Medicare Other | Source: Ambulatory Visit | Attending: Internal Medicine | Admitting: Internal Medicine

## 2019-06-09 VITALS — BP 130/80 | HR 90 | Wt 212.4 lb

## 2019-06-09 DIAGNOSIS — K7031 Alcoholic cirrhosis of liver with ascites: Secondary | ICD-10-CM | POA: Diagnosis not present

## 2019-06-09 DIAGNOSIS — I5030 Unspecified diastolic (congestive) heart failure: Secondary | ICD-10-CM | POA: Insufficient documentation

## 2019-06-09 DIAGNOSIS — I130 Hypertensive heart and chronic kidney disease with heart failure and stage 1 through stage 4 chronic kidney disease, or unspecified chronic kidney disease: Secondary | ICD-10-CM | POA: Diagnosis not present

## 2019-06-09 DIAGNOSIS — Z8249 Family history of ischemic heart disease and other diseases of the circulatory system: Secondary | ICD-10-CM | POA: Insufficient documentation

## 2019-06-09 DIAGNOSIS — N183 Chronic kidney disease, stage 3 unspecified: Secondary | ICD-10-CM

## 2019-06-09 DIAGNOSIS — Z8042 Family history of malignant neoplasm of prostate: Secondary | ICD-10-CM | POA: Diagnosis not present

## 2019-06-09 DIAGNOSIS — Z79899 Other long term (current) drug therapy: Secondary | ICD-10-CM | POA: Insufficient documentation

## 2019-06-09 DIAGNOSIS — Z8379 Family history of other diseases of the digestive system: Secondary | ICD-10-CM | POA: Diagnosis not present

## 2019-06-09 DIAGNOSIS — E781 Pure hyperglyceridemia: Secondary | ICD-10-CM | POA: Diagnosis not present

## 2019-06-09 DIAGNOSIS — K7030 Alcoholic cirrhosis of liver without ascites: Secondary | ICD-10-CM | POA: Insufficient documentation

## 2019-06-09 DIAGNOSIS — M109 Gout, unspecified: Secondary | ICD-10-CM | POA: Diagnosis not present

## 2019-06-09 DIAGNOSIS — K766 Portal hypertension: Secondary | ICD-10-CM

## 2019-06-09 DIAGNOSIS — J449 Chronic obstructive pulmonary disease, unspecified: Secondary | ICD-10-CM | POA: Insufficient documentation

## 2019-06-09 DIAGNOSIS — I2510 Atherosclerotic heart disease of native coronary artery without angina pectoris: Secondary | ICD-10-CM | POA: Diagnosis not present

## 2019-06-09 DIAGNOSIS — Z886 Allergy status to analgesic agent status: Secondary | ICD-10-CM | POA: Diagnosis not present

## 2019-06-09 DIAGNOSIS — H9190 Unspecified hearing loss, unspecified ear: Secondary | ICD-10-CM | POA: Diagnosis not present

## 2019-06-09 DIAGNOSIS — I50810 Right heart failure, unspecified: Secondary | ICD-10-CM | POA: Diagnosis not present

## 2019-06-09 DIAGNOSIS — I2729 Other secondary pulmonary hypertension: Secondary | ICD-10-CM

## 2019-06-09 DIAGNOSIS — R55 Syncope and collapse: Secondary | ICD-10-CM | POA: Insufficient documentation

## 2019-06-09 DIAGNOSIS — K219 Gastro-esophageal reflux disease without esophagitis: Secondary | ICD-10-CM | POA: Diagnosis not present

## 2019-06-09 DIAGNOSIS — I10 Essential (primary) hypertension: Secondary | ICD-10-CM | POA: Diagnosis not present

## 2019-06-09 DIAGNOSIS — I5032 Chronic diastolic (congestive) heart failure: Secondary | ICD-10-CM

## 2019-06-09 DIAGNOSIS — E039 Hypothyroidism, unspecified: Secondary | ICD-10-CM | POA: Insufficient documentation

## 2019-06-09 DIAGNOSIS — I2721 Secondary pulmonary arterial hypertension: Secondary | ICD-10-CM

## 2019-06-09 DIAGNOSIS — D61818 Other pancytopenia: Secondary | ICD-10-CM | POA: Diagnosis not present

## 2019-06-09 DIAGNOSIS — G4733 Obstructive sleep apnea (adult) (pediatric): Secondary | ICD-10-CM | POA: Insufficient documentation

## 2019-06-09 DIAGNOSIS — Z7951 Long term (current) use of inhaled steroids: Secondary | ICD-10-CM | POA: Diagnosis not present

## 2019-06-09 DIAGNOSIS — Z841 Family history of disorders of kidney and ureter: Secondary | ICD-10-CM | POA: Diagnosis not present

## 2019-06-09 DIAGNOSIS — F1021 Alcohol dependence, in remission: Secondary | ICD-10-CM | POA: Insufficient documentation

## 2019-06-09 DIAGNOSIS — Z888 Allergy status to other drugs, medicaments and biological substances status: Secondary | ICD-10-CM | POA: Diagnosis not present

## 2019-06-09 DIAGNOSIS — Z87891 Personal history of nicotine dependence: Secondary | ICD-10-CM | POA: Diagnosis not present

## 2019-06-09 LAB — BASIC METABOLIC PANEL
Anion gap: 9 (ref 5–15)
BUN: 32 mg/dL — ABNORMAL HIGH (ref 8–23)
CO2: 19 mmol/L — ABNORMAL LOW (ref 22–32)
Calcium: 8.5 mg/dL — ABNORMAL LOW (ref 8.9–10.3)
Chloride: 110 mmol/L (ref 98–111)
Creatinine, Ser: 2.05 mg/dL — ABNORMAL HIGH (ref 0.61–1.24)
GFR calc Af Amer: 37 mL/min — ABNORMAL LOW (ref >60)
GFR calc non Af Amer: 32 mL/min — ABNORMAL LOW (ref >60)
Glucose, Bld: 93 mg/dL (ref 70–99)
Potassium: 3.6 mmol/L (ref 3.5–5.1)
Sodium: 138 mmol/L (ref 135–145)

## 2019-06-09 MED ORDER — FUROSEMIDE 40 MG PO TABS: ORAL_TABLET | ORAL | 3 refills | Status: DC

## 2019-06-09 NOTE — Progress Notes (Signed)
Advanced [facility] Clinic Note  Date:  06/09/2019   ID:  Darren Mueller, DOB 07/14/1951, MRN [id number]  Location: Home  Provider location: [facility] Type of Visit: Established patient  PCP:  [name], MD  Cardiologist:  [name], MD Primary HF: Leronda Lewers  Chief Complaint: Heart Failure follow-up   History of Present Illness:  Darren Mueller ("[name]") is a 68 yo with history of COPD, portopulmonary hypertension with RV failure, and ETOH cirrhosis.  Admitted in 6/17 with CP and SOB. Was diuresed. Troponins normal. Underwent R/L cath. Minimal CAD with moderate PAH and preserved cardiac output.   Admitted 06/2016 and 07/2016 with syncope.  On September admission found to have elevated ETOH level as well as R 6th rib fracture.   Wore 30 day monitor up until 08/28/16. No AF noted. No events.   Admitted February 2018 after syncopal event. LINQ placed.   Was admitted 4/20 with hematochezia and melena. His hemoglobin remained stable.  No episodes of GI bleed after hospitalization.  Underwent EGD by GI with finding of Grade 1 and a small esophageal varices, no evidence of recent bleeding, portal hypertensive gastropathy. Started on nadolol which made him very dizzy. He stopped this and feels a lot   Admitted  7/13-16/20 with volume overload. Diuresed 21 pounds. D/c weight 192.  Echo  05/17/19: LVEF > 65% RV severely dilated with moderate RV dysfunction. Moderate TR RVSP . While in hospital was pancytopenic. Possibility of BMBx discussed with Dr. Clelia Croft but felt it was not necessary as it was felt he had splenic sequestration and counts were low but very stable.  He is here for post hospital f/u. Says he feels like a million bucks. Spends a lot of time taking care of his mother who is under Hospice Care. Weight stable at home at 199 pounds on lasix 40 bid. Does not take extra lasix. Continues with mild DOE. No edema, orthopnea or PND. No  further syncope.   PAH meds 1) Macitnentan 10 mg daily 2) Adcirca 40 mg daily  3) Selexipeg  1000/1200 daily   Studies: ECHO 12/14 EF 55-60% Peak PA pressure 35. Severe RV dysfunction  ECHO 7/15 EF 60% RV moderately to severely dilated. Moderate HK RVSP 57mm HG  ECHO 6/16 EF 60% RV moderately to severely dilated. Severe HK RVSP ~65 mm HG. D-shaped septum Echo 2/17 LVEF 60-65% RV massively dilated. Flat septum. Severe HK. Moderate TR RVSP ~65. IVC small. No effusion  ECHO 01/01/2017 EF 50-55% Grade I DD. RV severely dilated. Peak PA pressure 62 mm hg Echo 9/19 with EF 60-65% RV markedly dilated and hypokinetic with D-shaped septum. RVSP  Cath 6/17 Mid RCA lesion, 20% stenosed. Dist LAD lesion, 20% stenosed. Ao = 107/70 (87) LV = 106/3/11 RA = 4 RV = 74/11 PA = 74/26 (45) PCW = 6 Fick cardiac output/index = 4.6.2.2 PVR = 8.5 Ao sat = 95% PA sat = 61%, 58%  RHC 2/17 RA = 11 RV = 80/16/19 PA = 83/61 (71) PCW = 9 Fick cardiac output/index = 5.6/2.6 PVR = 10.3 WU Ao sat = 94% PA sat = 65%, 68% High SVC sat = 65%   PFTs 3/14 FEV1 2.02 L (58%) FVC 2.7L (54%) FEV1/FVC (89%) DLCO 53%  PFTs 1/17 FEV1 2.05 L (61%) FVC 2.48L (56%) DLCO 44%   Ab u/s 8/16: + cirrhosis/mild to moderate splenomegaly  6 min walk 01/10/13, 1290 feet  (01/10/14) = 1280  feet (380 m) (9/15) = 1350 feet (411 m) O2 sats ranged from 89-95% on room air, HR ranged from 100-129. (6/16) = 384 meters  (2/17) = 1250 feet (377m) (8/17) = 1320 feet       Past Medical History:  Diagnosis Date  . Alcohol dependence (HCC) 03/22/2011   In remission   . Alcohol dependence in remission (HCC) 03/22/2011   In remission   . Alcoholic cirrhosis of liver with ascites (HCC) 2014  . Anginal pain (HCC)    with SOB admitted 04/20/2019  . Cardiomegaly 02/2019   Noted on CXR  . CHF (congestive heart failure) (HCC)   . CKD (chronic kidney disease), stage III (HCC) 07/18/2013   . COPD (chronic obstructive pulmonary disease) (HCC)   . Coronary artery disease   . Dermatitis 06/10/2015  . Diarrhea 09/04/2013  . Dyspnea    admitted 04/20/2019  . Elbow pain, left 03/17/2017  . Eustachian tube dysfunction   . GERD (gastroesophageal reflux disease)   . Gout   . Headache   . Hearing loss   . Heart murmur   . Hx of colonic polyps   . Hypertension   . Hypertriglyceridemia 03/17/2017  . Hypopotassemia   . Otalgia 11/28/2013  . Other chronic pulmonary heart diseases   . PAH (pulmonary artery hypertension) (HCC)    RV failure  . Pancytopenia (HCC)   . Pleural effusion 2014   Noted on CT:   Moderately large simple layering right pleural effusion   . Preventative health care 06/29/2016  . Pulmonary nodule 2014   Noted on CT: An 8 mm pulmonary nodule in the periphery of the left lower lobe  . Rectal bleeding   . Red eye 03/03/2016   right  . Seizures (HCC)   . Sleep apnea    does not wear CPAP  . Splenomegaly 12/2015   Noted on CT  . Thoracic aorta atherosclerosis (HCC) 12/2017  . Thrombocytopenia (HCC) 06/29/2016  . Unspecified hypothyroidism 01/25/2013  . Unspecified pleural effusion   . Vertigo   . Wears glasses    Past Surgical History:  Procedure Laterality Date  . CARDIAC CATHETERIZATION N/A 12/21/2015   Procedure: Right Heart Cath;  Surgeon: Dolores Patty, MD;  Location: [facility] INVASIVE CV LAB;  Service: Cardiovascular;  Laterality: N/A;  . CARDIAC CATHETERIZATION N/A 04/15/2016   Procedure: Right/Left Heart Cath and Coronary Angiography;  Surgeon: Dolores Patty, MD;  Location: [facility] INVASIVE CV LAB;  Service: Cardiovascular;  Laterality: N/A;  . COLONOSCOPY N/A 12/08/2013   Procedure: COLONOSCOPY;  Surgeon: Rachael Fee, MD;  Location: [facility] ENDOSCOPY;  Service: Endoscopy;  Laterality: N/A;  . COLONOSCOPY WITH PROPOFOL N/A 03/24/2019   Procedure: COLONOSCOPY WITH PROPOFOL;  Surgeon: Rachael Fee, MD;  Location: [facility] ENDOSCOPY;  Service: Endoscopy;   Laterality: N/A;  Draw CBC in preop  . COLONOSCOPY WITH PROPOFOL N/A 04/28/2019   Procedure: COLONOSCOPY WITH PROPOFOL;  Surgeon: Rachael Fee, MD;  Location: [facility] ENDOSCOPY;  Service: Endoscopy;  Laterality: N/A;  . ESOPHAGOGASTRODUODENOSCOPY (EGD) WITH PROPOFOL N/A 03/27/2016   Procedure: ESOPHAGOGASTRODUODENOSCOPY (EGD) WITH PROPOFOL;  Surgeon: Rachael Fee, MD;  Location: [facility] ENDOSCOPY;  Service: Endoscopy;  Laterality: N/A;  . ESOPHAGOGASTRODUODENOSCOPY (EGD) WITH PROPOFOL N/A 02/14/2019   Procedure: ESOPHAGOGASTRODUODENOSCOPY (EGD) WITH PROPOFOL;  Surgeon: Beverley Fiedler, MD;  Location: [facility] ENDOSCOPY;  Service: Gastroenterology;  Laterality: N/A;  . EYE SURGERY Bilateral 03/15/2019   lasix  . HEMOSTASIS CLIP PLACEMENT  04/28/2019  Procedure: HEMOSTASIS CLIP PLACEMENT;  Surgeon: Rachael Fee, MD;  Location: [facility] ENDOSCOPY;  Service: Endoscopy;;  . LOOP RECORDER INSERTION N/A 01/01/2017   Procedure: Loop Recorder Insertion;  Surgeon: Hillis Range, MD;  Location: [facility] INVASIVE CV LAB;  Service: Cardiovascular;  Laterality: N/A;  . POLYPECTOMY  04/28/2019   Procedure: POLYPECTOMY;  Surgeon: Rachael Fee, MD;  Location: [facility] ENDOSCOPY;  Service: Endoscopy;;  . RIGHT HEART CATHETERIZATION Right 06/06/2014   Procedure: RIGHT HEART CATH;  Surgeon: Dolores Patty, MD;  Location: [facility] CATH LAB;  Service: Cardiovascular;  Laterality: Right;  . UVULOPALATOPHARYNGOPLASTY  1999     Current Outpatient Medications  Medication Sig Dispense Refill  . acetaminophen (TYLENOL) 500 MG tablet Take 1,000 mg by mouth every 6 (six) hours as needed (for pain.).    Marland Kitchen Albuterol Sulfate (PROAIR RESPICLICK) 108 (90 Base) MCG/ACT AEPB Inhale 2 puffs into the lungs every 6 (six) hours as needed (SOB or wheezing). (Patient taking differently: Inhale 2 puffs into the lungs every 6 (six) hours as needed (shortness of breath). ) 3 each 1  . allopurinol (ZYLOPRIM) 100 MG tablet Take 2 tablets (200 mg total) by mouth daily.  180 tablet 1  . Blood Pressure Monitoring (BLOOD PRESSURE CUFF) MISC Use as directed. Check blood pressure bid prn Dx:I10 1 each 0  . colchicine 0.6 MG tablet Take 0.6 mg by mouth daily as needed (gout flare).    . diphenoxylate-atropine (LOMOTIL) 2.5-0.025 MG tablet Take 1 tablet by mouth 3 (three) times daily. (Patient taking differently: Take 1 tablet by mouth daily. ) 60 tablet 0  . doxepin (SINEQUAN) 10 MG capsule TAKE 1-2 CAPSULES (10-20 MG TOTAL) BY MOUTH AT BEDTIME AS NEEDED. (Patient taking differently: Take 10-20 mg by mouth at bedtime as needed (sleep). ) 60 capsule 5  . Ferrous Fumarate (HEMOCYTE) 324 (106 Fe) MG TABS tablet Take 1 tablet (106 mg of iron total) by mouth daily. 30 tablet 5  . fluticasone-salmeterol (ADVAIR HFA) 230-21 MCG/ACT inhaler Inhale 2 puffs into the lungs every morning. 48 g 1  . folic acid (FOLVITE) 1 MG tablet Take 1 tablet (1 mg total) by mouth daily. 30 tablet 5  . furosemide (LASIX) 40 MG tablet Take 1 tablet (40 mg total) by mouth 2 (two) times daily. (Patient taking differently: Take 40 mg by mouth at bedtime. ) 60 tablet 3  . hyoscyamine (LEVSIN SL) 0.125 MG SL tablet Place 1 tablet (0.125 mg total) under the tongue every 6 (six) hours as needed. (Patient taking differently: Place 0.125 mg under the tongue daily. ) 90 tablet 3  . macitentan (OPSUMIT) 10 MG tablet Take 1 tablet (10 mg total) by mouth daily. 90 tablet 0  . metolazone (ZAROXOLYN) 2.5 MG tablet Take 0.5-1 tablets (1.25-2.5 mg total) by mouth daily as needed (edema, weight gain>3#,). (Patient taking differently: Take 1.25-2.5 mg by mouth at bedtime. ) 20 tablet 0  . pantoprazole (PROTONIX) 40 MG tablet Take 1 tablet (40 mg total) by mouth daily before breakfast. 30 tablet 0  . potassium chloride (K-DUR) 10 MEQ tablet Take 2 tablets (20 mEq total) by mouth daily. 60 tablet 3  . rosuvastatin (CRESTOR) 5 MG tablet Take 1 tablet (5 mg total) by mouth daily. 90 tablet 3  . Selexipag (UPTRAVI) 1000 MCG  TABS Take 1,000 mcg by mouth 2 (two) times daily. 60 tablet 11  . spironolactone (ALDACTONE) 50 MG tablet TAKE 1 TABLET DAILY (Patient taking differently: Take 50 mg by mouth daily. )  90 tablet 1  . tadalafil, PAH, (ADCIRCA) 20 MG tablet TAKE 2 TABLETS (40 MG TOTAL) DAILY (Patient taking differently: Take 40 mg by mouth daily. ) 60 tablet 12  . thiamine 100 MG tablet Take 1 tablet (100 mg total) by mouth daily. 30 tablet 5   No current facility-administered medications for this encounter.     Allergies:   Aspirin and Nitroglycerin   Social History:  The patient  reports that he quit smoking about 39 years ago. His smoking use included cigarettes. He has a 10.00 pack-year smoking history. He has never used smokeless tobacco. He reports current alcohol use of about 6.0 standard drinks of alcohol per week. He reports that he does not use drugs.   Family History:  The patient's family history includes Alcoholism in his maternal grandfather; Cancer in his father; Gout in his brother; Heart attack in his mother; Heart disease in his mother; Hypertension in his brother, brother, and mother; Kidney failure in his mother; Liver disease in his maternal grandfather and mother; Migraines in his sister; Prostate cancer in his father.   ROS:  Please see the history of present illness.   All other systems are personally reviewed and negative.   Vitals:   06/09/19 1542  BP: 130/80  Pulse: 90  SpO2: 95%  Weight: 96.3 kg (212 lb 6.4 oz)    Exam:   General:  Well appearing. No resp difficulty HEENT: normal Neck: supple. JVP to jaw Carotids 2+ bilat; no bruits. No lymphadenopathy or thryomegaly appreciated. Cor: PMI nondisplaced. Regular rate & rhythm. Increased P@ 2/6 TR Lungs: clear Abdomen: soft, nontender, mildly distended. No hepatosplenomegaly. No bruits or masses. Good bowel sounds. Extremities: no cyanosis, clubbing, rash, 2+ edema Neuro: alert & orientedx3, cranial nerves grossly intact. moves  all 4 extremities w/o difficulty. Affect pleasant   Recent Labs: 05/16/2019: B Natriuretic Peptide 1,119.2; Pro B Natriuretic peptide (BNP) 1,293.0 06/02/2019: ALT 18; BUN 32; Creatinine, Ser 1.72; Hemoglobin 10.1; Magnesium 1.7; Platelets 26.0 Repeated and verified X2.; Potassium 3.7; Sodium 138; TSH 2.50  Personally reviewed   Wt Readings from Last 3 Encounters:  06/02/19 95.7 kg (211 lb)  05/19/19 87.5 kg (192 lb 14.4 oz)  05/16/19 99.9 kg (220 lb 3.2 oz)      ASSESSMENT AND PLAN:  1. PAH: Suspected portopulmonary HTN in setting of ETOH cirrhosis. - Stable NYHA III. - 9/19 1280 feet (390 meters) - Echo 9/19 with EF 60-65% RV markedly dilated and hypokinetic with D-shaped septum  - Echo  05/17/19: LVEF > 65% RV severely dilated with moderate RV dysfunction. Septal flattening Moderate TR RVSP . - Continue selexipag 1000 mcg twice a day. Intolerant higher dose due to diarrhea and headaches .  - Continue macitentan 10 daily and adcirca 40 mg daily. - With RV strain on echo may need to consider repeat RHC and  IV therapies in the near future.  - Discussed need for complete ETOH cessation.   2. Diastolic HF - Volume status going back up.  - Increase lasix to 80/40 - check labs today - f/u in several weeks  3. CKD III:  - Baseline creatinine 1.7-2.0 - Followed by Dr. Arrie Aran  - Repeat labs today  4. Cirrhosis:  - Likely combination of RV failure and ETOH - Follows with Dr. Christella Hartigan. - Did not tolerate nadolol.  - EGD 4/20 stable - Stressed need for ETOH cessation  5. Recurrent syncope/seizure - LINQ interrogations have shown no arrhythmias and thus most likely seizure -  Continue Keppra. Follows with Dr. Karel Jarvis  6. HTN:  -Blood pressure well controlled. Continue current regimen.  7. OSA:  - Has mild OSA with AHI 12 and desats down to 82%.  - Intolerant CPAP.   8. Pancytopenia:  - Has seen Dr. Clelia Croft in Hematology - felt like it is due to splenic  sequestration.  - No role for BMBx current per Dr. Clelia Croft  Signed, [name], MD  06/09/2019 2:37 PM  [facility] [address] [facility] [place] [address] [phone] (office) [phone] (fax)

## 2019-06-09 NOTE — Patient Instructions (Signed)
Lab work done today. We will notify you of any abnormal lab work. No news is good news.  Lab work will need to be done again in 2 weeks.  INCREASE Furosemide to 23m (2 tabs) every morning and 42m(1 tab) every evening.  [facility] with a Telehealth visit and an office visit in 2 months.  At the [facility] and your health needs are our priority. As part of our continuing mission to provide you with exceptional heart care, we have created designated Provider Care Teams. These Care Teams include your primary Cardiologist (physician) and Advanced Practice Providers (APPs- Physician Assistants and Nurse Practitioners) who all work together to provide you with the care you need, when you need it.   You may see any of the following providers on your designated Care Team at your next follow up: . Marland Kitchenr DaGlori Bickers Dr DaLoralie Champagne AmDarrick GrinderNP   Please be sure to bring in all your medications bottles to every appointment.

## 2019-06-16 ENCOUNTER — Ambulatory Visit (HOSPITAL_COMMUNITY)
Admission: RE | Admit: 2019-06-16 | Discharge: 2019-06-16 | Disposition: A | Discharge status: Home / Self Care | Payer: Medicare Other | Source: Ambulatory Visit | Attending: Internal Medicine | Admitting: Internal Medicine

## 2019-06-16 ENCOUNTER — Other Ambulatory Visit: Payer: Self-pay

## 2019-06-16 DIAGNOSIS — K766 Portal hypertension: Secondary | ICD-10-CM | POA: Insufficient documentation

## 2019-06-16 DIAGNOSIS — I2721 Secondary pulmonary arterial hypertension: Secondary | ICD-10-CM | POA: Diagnosis not present

## 2019-06-16 LAB — BASIC METABOLIC PANEL
Anion gap: 10 (ref 5–15)
BUN: 28 mg/dL — ABNORMAL HIGH (ref 8–23)
CO2: 22 mmol/L (ref 22–32)
Calcium: 8.5 mg/dL — ABNORMAL LOW (ref 8.9–10.3)
Chloride: 104 mmol/L (ref 98–111)
Creatinine, Ser: 2.14 mg/dL — ABNORMAL HIGH (ref 0.61–1.24)
GFR calc Af Amer: 35 mL/min — ABNORMAL LOW (ref >60)
GFR calc non Af Amer: 30 mL/min — ABNORMAL LOW (ref >60)
Glucose, Bld: 105 mg/dL — ABNORMAL HIGH (ref 70–99)
Potassium: 3.4 mmol/L — ABNORMAL LOW (ref 3.5–5.1)
Sodium: 136 mmol/L (ref 135–145)

## 2019-06-17 DIAGNOSIS — Z1159 Encounter for screening for other viral diseases: Secondary | ICD-10-CM | POA: Diagnosis not present

## 2019-06-17 DIAGNOSIS — N2581 Secondary hyperparathyroidism of renal origin: Secondary | ICD-10-CM | POA: Diagnosis not present

## 2019-06-17 DIAGNOSIS — I129 Hypertensive chronic kidney disease with stage 1 through stage 4 chronic kidney disease, or unspecified chronic kidney disease: Secondary | ICD-10-CM | POA: Diagnosis not present

## 2019-06-17 DIAGNOSIS — N183 Chronic kidney disease, stage 3 (moderate): Secondary | ICD-10-CM | POA: Diagnosis not present

## 2019-06-17 DIAGNOSIS — G4733 Obstructive sleep apnea (adult) (pediatric): Secondary | ICD-10-CM | POA: Diagnosis not present

## 2019-06-17 DIAGNOSIS — R809 Proteinuria, unspecified: Secondary | ICD-10-CM | POA: Diagnosis not present

## 2019-06-17 DIAGNOSIS — D631 Anemia in chronic kidney disease: Secondary | ICD-10-CM | POA: Diagnosis not present

## 2019-06-17 DIAGNOSIS — K921 Melena: Secondary | ICD-10-CM | POA: Diagnosis not present

## 2019-06-17 DIAGNOSIS — N179 Acute kidney failure, unspecified: Secondary | ICD-10-CM | POA: Diagnosis not present

## 2019-06-17 DIAGNOSIS — I2721 Secondary pulmonary arterial hypertension: Secondary | ICD-10-CM | POA: Diagnosis not present

## 2019-06-17 DIAGNOSIS — D6959 Other secondary thrombocytopenia: Secondary | ICD-10-CM | POA: Diagnosis not present

## 2019-06-21 ENCOUNTER — Ambulatory Visit (INDEPENDENT_AMBULATORY_CARE_PROVIDER_SITE_OTHER): Payer: Medicare Other | Admitting: *Deleted

## 2019-06-21 DIAGNOSIS — R55 Syncope and collapse: Secondary | ICD-10-CM

## 2019-06-21 DIAGNOSIS — I5033 Acute on chronic diastolic (congestive) heart failure: Secondary | ICD-10-CM

## 2019-06-21 LAB — CUP PACEART REMOTE DEVICE CHECK
Date Time Interrogation Session: 20200818154209
Implantable Pulse Generator Implant Date: 20180301

## 2019-06-23 ENCOUNTER — Encounter (HOSPITAL_COMMUNITY): Payer: Self-pay

## 2019-06-23 ENCOUNTER — Ambulatory Visit (HOSPITAL_COMMUNITY)
Admission: RE | Admit: 2019-06-23 | Discharge: 2019-06-23 | Disposition: A | Discharge status: Home / Self Care | Payer: Medicare Other | Source: Ambulatory Visit | Attending: Internal Medicine | Admitting: Internal Medicine

## 2019-06-23 ENCOUNTER — Other Ambulatory Visit: Payer: Self-pay

## 2019-06-23 DIAGNOSIS — I5032 Chronic diastolic (congestive) heart failure: Secondary | ICD-10-CM

## 2019-06-23 DIAGNOSIS — N183 Chronic kidney disease, stage 3 unspecified: Secondary | ICD-10-CM

## 2019-06-23 DIAGNOSIS — I10 Essential (primary) hypertension: Secondary | ICD-10-CM

## 2019-06-23 DIAGNOSIS — I2721 Secondary pulmonary arterial hypertension: Secondary | ICD-10-CM | POA: Diagnosis not present

## 2019-06-23 DIAGNOSIS — K766 Portal hypertension: Secondary | ICD-10-CM

## 2019-06-23 MED ORDER — POTASSIUM CHLORIDE ER 10 MEQ PO TBCR: 20.0000 meq | EXTENDED_RELEASE_TABLET | Freq: Every day | ORAL | 3 refills | Status: DC

## 2019-06-23 MED FILL — POTASSIUM CL ER 10 MEQ TAB: 10 | 30 days supply | Qty: 60 | Fill #0

## 2019-06-23 NOTE — Patient Instructions (Signed)
Please take 19mq of Potassium today. Please be sure to take 267m of potassium daily.  [facility].   At the [facility] and your health needs are our priority. As part of our continuing mission to provide you with exceptional heart care, we have created designated Provider Care Teams. These Care Teams include your primary Cardiologist (physician) and Advanced Practice Providers (APPs- Physician Assistants and Nurse Practitioners) who all work together to provide you with the care you need, when you need it.   You may see any of the following providers on your designated Care Team at your next follow up: . Marland Kitchenr DaGlori Bickers Dr DaLoralie Champagne AmDarrick GrinderNP   Please be sure to bring in all your medications bottles to every appointment.

## 2019-06-23 NOTE — Addendum Note (Signed)
Encounter addended by: Marlise Eves, RN on: 06/23/2019 2:59 PM  Actions taken: Pharmacy for encounter modified, Order list changed, Diagnosis association updated, Clinical Note Signed

## 2019-06-23 NOTE — Progress Notes (Signed)
1. Add kcl 20 daily. Take 40 today  2. Repeat BMET next week please  2. Televisit with me 2-3 weeks    Called pt to review after visit summary. Pt already has 20 of kcl on med list. Refilled med. Scheduler scheduled f/u. Unable to leave voicemail because pt vm box full.

## 2019-06-23 NOTE — Progress Notes (Signed)
Heart Failure TeleHealth Note  Due to national recommendations of social distancing due to COVID 19, Audio/video telehealth visit is felt to be most appropriate for this patient at this time.  See MyChart message from today for patient consent regarding telehealth for Darren Mueller.  Date:  06/23/2019   ID:  Darren Mueller, DOB 1951-10-13, MRN [id number]  Location: Home  Provider location: [facility] Type of Visit: Established patient  PCP:  [name], MD  Cardiologist:  [name], MD Primary HF: Darren Mueller  Chief Complaint: Heart Failure follow-up   History of Present Illness:  Darren Mueller ("[name]") is a 68yo with history of COPD, portopulmonary hypertension with RV failure, and ETOH cirrhosis.  Admitted in 6/17 with CP and SOB. Underwent R/L cath. Minimal CAD with moderate PAH and preserved cardiac output.   Admitted 06/2016 and 07/2016 with syncope. On September admission found to have elevated ETOH level as well as R 6th rib fracture. Wore 30 day monitor up until 08/28/16. No AF noted. No events.   Admitted February2018after syncopal event. LINQ placed.   Was admitted 4/20 with hematochezia and melena. His hemoglobin remained stable. No episodes of GI bleed after hospitalization. Underwent EGD by GI with finding of Grade 1 and a small esophageal varices, no evidence of recent bleeding,portal hypertensive gastropathy. Started on nadolol which made him very dizzy. He stopped this and feels a lot   Admitted  7/13-16/20 with volume overload. Diuresed 21 pounds. D/c weight 192.  Echo  05/17/19: LVEF > 65% RV severely dilated with moderate RV dysfunction. Moderate TR RVSP . While in Mueller was pancytopenic. Possibility of BMBx discussed with Dr. Clelia Croft but felt it was not necessary as it was felt he had splenic sequestration and counts were low but very stable.  He presents via Web designer for a  telehealth visit today. We saw him two weeks ago and I felt volume status still up. Lasix increased to 40/80. Weight down about 10 pounds to 203. Mild exertional dyspnea (chronic). Still swelling in LE. No CP, orthopnea or PND.   PAH meds 1) Macitnentan 10 mg daily 2) Adcirca 40 mg daily  3) Selexipeg 1000/1200 daily   Studies: ECHO 12/14 EF 55-60% Peak PA pressure 35. Severe RV dysfunction  ECHO 7/15 EF 60% RV moderately to severely dilated. Moderate HK RVSP 57mm HG  ECHO 6/16 EF 60% RV moderately to severely dilated. Severe HK RVSP ~65 mm HG. D-shaped septum Echo 2/17 LVEF 60-65% RV massively dilated. Flat septum. Severe HK. Moderate TR RVSP ~65. IVC small. No effusion  ECHO 01/01/2017 EF 50-55% Grade I DD. RV severely dilated. Peak PA pressure 62 mm hg Echo 9/19 with EF 60-65% RV markedly dilated and hypokinetic with D-shaped septum. RVSP  Cath 6/17 Mid RCA lesion, 20% stenosed. Dist LAD lesion, 20% stenosed. Ao = 107/70 (87) LV = 106/3/11 RA = 4 RV = 74/11 PA = 74/26 (45) PCW = 6 Fick cardiac output/index = 4.6.2.2 PVR = 8.5 Ao sat = 95% PA sat = 61%, 58%  RHC 2/17 RA = 11 RV = 80/16/19 PA = 83/61 (71) PCW = 9 Fick cardiac output/index = 5.6/2.6 PVR = 10.3 WU Ao sat = 94% PA sat = 65%, 68% High SVC sat = 65%   PFTs 3/14 FEV1 2.02 L (58%) FVC 2.7L (54%) FEV1/FVC (89%) DLCO 53%  PFTs 1/17 FEV1 2.05 L (61%) FVC 2.48L (56%) DLCO 44%   Ab u/s 8/16: +  cirrhosis/mild to moderate splenomegaly  6 min walk 01/10/13, 1290 feet  (01/10/14) = 1280 feet (380 m) (9/15) = 1350 feet (411 m) O2 sats ranged from 89-95% on room air, HR ranged from 100-129. (6/16) = 384 meters  (2/17) = 1250 feet (330m) (8/17) = 1320 feet    Edyth Mueller denies symptoms worrisome for COVID 19.   Past Medical History:  Diagnosis Date  . Alcohol dependence (HCC) 03/22/2011   In remission   . Alcohol dependence in remission (HCC) 03/22/2011    In remission   . Alcoholic cirrhosis of liver with ascites (HCC) 2014  . Anginal pain (HCC)    with SOB admitted 04/20/2019  . Cardiomegaly 02/2019   Noted on CXR  . CHF (congestive heart failure) (HCC)   . CKD (chronic kidney disease), stage III (HCC) 07/18/2013  . COPD (chronic obstructive pulmonary disease) (HCC)   . Coronary artery disease   . Dermatitis 06/10/2015  . Diarrhea 09/04/2013  . Dyspnea    admitted 04/20/2019  . Elbow pain, left 03/17/2017  . Eustachian tube dysfunction   . GERD (gastroesophageal reflux disease)   . Gout   . Headache   . Hearing loss   . Heart murmur   . Hx of colonic polyps   . Hypertension   . Hypertriglyceridemia 03/17/2017  . Hypopotassemia   . Otalgia 11/28/2013  . Other chronic pulmonary heart diseases   . PAH (pulmonary artery hypertension) (HCC)    RV failure  . Pancytopenia (HCC)   . Pleural effusion 2014   Noted on CT:   Moderately large simple layering right pleural effusion   . Preventative health care 06/29/2016  . Pulmonary nodule 2014   Noted on CT: An 8 mm pulmonary nodule in the periphery of the left lower lobe  . Rectal bleeding   . Red eye 03/03/2016   right  . Seizures (HCC)   . Sleep apnea    does not wear CPAP  . Splenomegaly 12/2015   Noted on CT  . Thoracic aorta atherosclerosis (HCC) 12/2017  . Thrombocytopenia (HCC) 06/29/2016  . Unspecified hypothyroidism 01/25/2013  . Unspecified pleural effusion   . Vertigo   . Wears glasses    Past Surgical History:  Procedure Laterality Date  . CARDIAC CATHETERIZATION N/A 12/21/2015   Procedure: Right Heart Cath;  Surgeon: Dolores Patty, MD;  Location: [facility] INVASIVE CV LAB;  Service: Cardiovascular;  Laterality: N/A;  . CARDIAC CATHETERIZATION N/A 04/15/2016   Procedure: Right/Left Heart Cath and Coronary Angiography;  Surgeon: Dolores Patty, MD;  Location: [facility] INVASIVE CV LAB;  Service: Cardiovascular;  Laterality: N/A;  . COLONOSCOPY N/A 12/08/2013   Procedure:  COLONOSCOPY;  Surgeon: Rachael Fee, MD;  Location: [facility] ENDOSCOPY;  Service: Endoscopy;  Laterality: N/A;  . COLONOSCOPY WITH PROPOFOL N/A 03/24/2019   Procedure: COLONOSCOPY WITH PROPOFOL;  Surgeon: Rachael Fee, MD;  Location: [facility] ENDOSCOPY;  Service: Endoscopy;  Laterality: N/A;  Draw CBC in preop  . COLONOSCOPY WITH PROPOFOL N/A 04/28/2019   Procedure: COLONOSCOPY WITH PROPOFOL;  Surgeon: Rachael Fee, MD;  Location: [facility] ENDOSCOPY;  Service: Endoscopy;  Laterality: N/A;  . ESOPHAGOGASTRODUODENOSCOPY (EGD) WITH PROPOFOL N/A 03/27/2016   Procedure: ESOPHAGOGASTRODUODENOSCOPY (EGD) WITH PROPOFOL;  Surgeon: Rachael Fee, MD;  Location: [facility] ENDOSCOPY;  Service: Endoscopy;  Laterality: N/A;  . ESOPHAGOGASTRODUODENOSCOPY (EGD) WITH PROPOFOL N/A 02/14/2019   Procedure: ESOPHAGOGASTRODUODENOSCOPY (EGD) WITH PROPOFOL;  Surgeon: Beverley Fiedler, MD;  Location: [facility]  ENDOSCOPY;  Service: Gastroenterology;  Laterality: N/A;  . EYE SURGERY Bilateral 03/15/2019   lasix  . HEMOSTASIS CLIP PLACEMENT  04/28/2019   Procedure: HEMOSTASIS CLIP PLACEMENT;  Surgeon: Rachael Fee, MD;  Location: [facility] ENDOSCOPY;  Service: Endoscopy;;  . LOOP RECORDER INSERTION N/A 01/01/2017   Procedure: Loop Recorder Insertion;  Surgeon: Hillis Range, MD;  Location: [facility] INVASIVE CV LAB;  Service: Cardiovascular;  Laterality: N/A;  . POLYPECTOMY  04/28/2019   Procedure: POLYPECTOMY;  Surgeon: Rachael Fee, MD;  Location: [facility] ENDOSCOPY;  Service: Endoscopy;;  . RIGHT HEART CATHETERIZATION Right 06/06/2014   Procedure: RIGHT HEART CATH;  Surgeon: Dolores Patty, MD;  Location: [facility] CATH LAB;  Service: Cardiovascular;  Laterality: Right;  . UVULOPALATOPHARYNGOPLASTY  1999     Current Outpatient Medications  Medication Sig Dispense Refill  . acetaminophen (TYLENOL) 500 MG tablet Take 1,000 mg by mouth every 6 (six) hours as needed (for pain.).    Marland Kitchen albuterol (VENTOLIN HFA) 108 (90 Base) MCG/ACT inhaler Inhale 2 puffs into the  lungs every 6 (six) hours as needed for wheezing or shortness of breath.    . allopurinol (ZYLOPRIM) 100 MG tablet Take 2 tablets (200 mg total) by mouth daily. 180 tablet 1  . Blood Pressure Monitoring (BLOOD PRESSURE CUFF) MISC Use as directed. Check blood pressure bid prn Dx:I10 1 each 0  . colchicine 0.6 MG tablet Take 0.6 mg by mouth daily as needed (gout flare).    . diphenoxylate-atropine (LOMOTIL) 2.5-0.025 MG tablet Take 1 tablet by mouth 3 (three) times daily. 60 tablet 0  . doxepin (SINEQUAN) 10 MG capsule Take 1-2 caps by mouth at bedtime as needed for sleep    . Ferrous Fumarate (HEMOCYTE) 324 (106 Fe) MG TABS tablet Take 1 tablet (106 mg of iron total) by mouth daily. 30 tablet 5  . fluticasone-salmeterol (ADVAIR HFA) 230-21 MCG/ACT inhaler Inhale 2 puffs into the lungs every morning. 48 g 1  . folic acid (FOLVITE) 1 MG tablet Take 1 tablet (1 mg total) by mouth daily. 30 tablet 5  . furosemide (LASIX) 40 MG tablet Take 2 tablets (80 mg total) by mouth every evening AND 1 tablet (40 mg total) every morning. 270 tablet 3  . hyoscyamine (LEVSIN SL) 0.125 MG SL tablet Place 0.125 mg under the tongue every 6 (six) hours as needed.    . macitentan (OPSUMIT) 10 MG tablet Take 1 tablet (10 mg total) by mouth daily. 90 tablet 0  . metolazone (ZAROXOLYN) 2.5 MG tablet Take 0.5-1 tab by mouth as needed for edema and weight gain    . mometasone-formoterol (DULERA) 100-5 MCG/ACT AERO Inhale 2 puffs into the lungs 2 (two) times daily.    . Multiple Vitamin (MULTIVITAMIN) tablet Take 1 tablet by mouth daily.    Marland Kitchen omeprazole (PRILOSEC) 40 MG capsule Take 40 mg by mouth daily.    . potassium chloride (K-DUR) 10 MEQ tablet Take 2 tablets (20 mEq total) by mouth daily. 60 tablet 3  . rosuvastatin (CRESTOR) 5 MG tablet Take 1 tablet (5 mg total) by mouth daily. 90 tablet 3  . Selexipag (UPTRAVI) 1000 MCG TABS Take 1,000 mcg by mouth 2 (two) times daily. 60 tablet 11  . tadalafil, PAH, (ADCIRCA) 20 MG  tablet Take 40 mg by mouth daily.    Marland Kitchen thiamine 100 MG tablet Take 1 tablet (100 mg total) by mouth daily. 30 tablet 5   No current facility-administered medications for this encounter.  Allergies:   Aspirin and Nitroglycerin   Social History:  The patient  reports that he quit smoking about 39 years ago. His smoking use included cigarettes. He has a 10.00 pack-year smoking history. He has never used smokeless tobacco. He reports current alcohol use of about 6.0 standard drinks of alcohol per week. He reports that he does not use drugs.   Family History:  The patient's family history includes Alcoholism in his maternal grandfather; Cancer in his father; Gout in his brother; Heart attack in his mother; Heart disease in his mother; Hypertension in his brother, brother, and mother; Kidney failure in his mother; Liver disease in his maternal grandfather and mother; Migraines in his sister; Prostate cancer in his father.   ROS:  Please see the history of present illness.   All other systems are personally reviewed and negative.   Exam:  (Video/Tele Health Call; Exam is subjective and or/visual.) General:  Speaks in full sentences. No resp difficulty. Lungs: Normal respiratory effort with conversation.  Abdomen: mildy distended per patient report Extremities: Pt endorses edema. Neuro: Alert & oriented x 3.   Recent Labs: 05/16/2019: B Natriuretic Peptide 1,119.2; Pro B Natriuretic peptide (BNP) 1,293.0 06/02/2019: ALT 18; Hemoglobin 10.1; Magnesium 1.7; Platelets 26.0 Repeated and verified X2.; TSH 2.50 06/16/2019: BUN 28; Creatinine, Ser 2.14; Potassium 3.4; Sodium 136  Personally reviewed   Wt Readings from Last 3 Encounters:  06/09/19 96.3 kg (212 lb 6.4 oz)  06/02/19 95.7 kg (211 lb)  05/19/19 87.5 kg (192 lb 14.4 oz)      ASSESSMENT AND PLAN:  1. PAH: Suspected portopulmonary HTN in setting of ETOH cirrhosis. - Stable NYHA III. - 9/19 1280 feet (390 meters) - Echo 9/19 with  EF 60-65% RV markedly dilated and hypokinetic with D-shaped septum - Echo  05/17/19: LVEF > 65% RV severely dilated with moderate RV dysfunction. Septal flattening Moderate TR RVSP . - Continue selexipag 1000 mcg twice a day. Intolerant higher dose due to diarrhea and headaches .  - Continue macitentan 10 daily and adcirca 40 mg daily. - With RV strain on echo may need to consider repeat RHC and  IV therapies in the near future. - Discussed need for complete ETOH cessation.   2. Diastolic HF - Volume status improving but still seems elevated likely R>L HF - Continue Lasix 40/80 for now - check labs next week - f/u in 2 weeks with televist  3. CKD III:  -Baseline creatinine 1.7-2.0. Was 2.1 on 06/16/19. Recehck next week  - Followed by Dr. Arrie Aran   4. Cirrhosis:  - Likely combination of RV failure and ETOH - Follows with Dr. Christella Hartigan. - Did not tolerate nadolol.  - EGD 4/20 stable - Stressed need for ETOH cessation  5. Recurrent syncope/seizure - LINQ interrogations have shown no arrhythmias and thus most likely seizure - Continue Keppra. Follows with Dr. Karel Jarvis  6. HTN:  -Blood pressure well controlled. Continue current regimen.  7. OSA:  - Has mild OSA with AHI 12 and desats down to 82%.  - Intolerant CPAP.   8. Pancytopenia:  - Has seen Dr. Clelia Croft in Hematology - felt like it is due to splenic sequestration.  - No role for BMBx current per Dr. Clelia Croft    9. Hypokalemia - add kcl 20 daily. Take 40 today  COVID screen The patient does not have any symptoms that suggest any further testing/ screening at this time.  Social distancing reinforced today.  Patient Risk: After full review of  this patients clinical status, I feel that they are at moderate risk for cardiac decompensation at this time.  Relevant cardiac medications were reviewed at length with the patient today. The patient does not have concerns regarding their medications at this time.    The following changes were made today:  None  Recommended follow-up:  Dr Gala Romney in 3 months with an ECHO   Today, I have spent 14 minutes with the patient with telehealth technology discussing the above issues .    Signed, [name], MD  06/23/2019 12:39 PM  [facility] [address] Heart and Vascular [place] [address] [phone] (office) [phone] (fax)

## 2019-06-29 ENCOUNTER — Other Ambulatory Visit: Payer: Self-pay | Admitting: Internal Medicine

## 2019-06-29 ENCOUNTER — Inpatient Hospital Stay: Payer: Medicare Other

## 2019-06-29 ENCOUNTER — Other Ambulatory Visit: Payer: Self-pay

## 2019-06-29 ENCOUNTER — Inpatient Hospital Stay: Payer: Medicare Other | Attending: Oncology | Admitting: Oncology

## 2019-06-29 VITALS — BP 140/77 | HR 91 | Temp 98.7°F | Resp 18 | Ht 72.0 in | Wt 207.1 lb

## 2019-06-29 DIAGNOSIS — Z7951 Long term (current) use of inhaled steroids: Secondary | ICD-10-CM | POA: Diagnosis not present

## 2019-06-29 DIAGNOSIS — K766 Portal hypertension: Secondary | ICD-10-CM | POA: Diagnosis not present

## 2019-06-29 DIAGNOSIS — D72819 Decreased white blood cell count, unspecified: Secondary | ICD-10-CM | POA: Diagnosis not present

## 2019-06-29 DIAGNOSIS — K7460 Unspecified cirrhosis of liver: Secondary | ICD-10-CM | POA: Insufficient documentation

## 2019-06-29 DIAGNOSIS — I110 Hypertensive heart disease with heart failure: Secondary | ICD-10-CM | POA: Diagnosis not present

## 2019-06-29 DIAGNOSIS — D696 Thrombocytopenia, unspecified: Secondary | ICD-10-CM | POA: Insufficient documentation

## 2019-06-29 DIAGNOSIS — Z79899 Other long term (current) drug therapy: Secondary | ICD-10-CM | POA: Diagnosis not present

## 2019-06-29 DIAGNOSIS — K7030 Alcoholic cirrhosis of liver without ascites: Secondary | ICD-10-CM

## 2019-06-29 DIAGNOSIS — I2721 Secondary pulmonary arterial hypertension: Secondary | ICD-10-CM | POA: Diagnosis not present

## 2019-06-29 LAB — CBC WITH DIFFERENTIAL (CANCER CENTER ONLY)
Abs Immature Granulocytes: 0 10*3/uL (ref 0.00–0.07)
Basophils Absolute: 0 10*3/uL (ref 0.0–0.1)
Basophils Relative: 1 %
Eosinophils Absolute: 0 10*3/uL (ref 0.0–0.5)
Eosinophils Relative: 2 %
HCT: 31.2 % — ABNORMAL LOW (ref 39.0–52.0)
Hemoglobin: 10 g/dL — ABNORMAL LOW (ref 13.0–17.0)
Immature Granulocytes: 0 %
Lymphocytes Relative: 32 %
Lymphs Abs: 0.4 10*3/uL — ABNORMAL LOW (ref 0.7–4.0)
MCH: 33.4 pg (ref 26.0–34.0)
MCHC: 32.1 g/dL (ref 30.0–36.0)
MCV: 104.3 fL — ABNORMAL HIGH (ref 80.0–100.0)
Monocytes Absolute: 0.2 10*3/uL (ref 0.1–1.0)
Monocytes Relative: 17 %
Neutro Abs: 0.7 10*3/uL — ABNORMAL LOW (ref 1.7–7.7)
Neutrophils Relative %: 48 %
Platelet Count: 22 10*3/uL — ABNORMAL LOW (ref 150–400)
RBC: 2.99 MIL/uL — ABNORMAL LOW (ref 4.22–5.81)
RDW: 18.6 % — ABNORMAL HIGH (ref 11.5–15.5)
WBC Count: 1.4 10*3/uL — ABNORMAL LOW (ref 4.0–10.5)
nRBC: 0 % (ref 0.0–0.2)

## 2019-06-29 NOTE — Progress Notes (Signed)
Hematology and Oncology Follow Up Visit  [place] G Hermans [id number] 09/12/1951 68 y.o. 06/29/2019 9:35 AM Blyth, Stacey A, MDNo ref. provider found   Principle Diagnosis: 68-year-old with thrombocytopenia and leukocytopenia related to cirrhosis of the liver and splenic sequestration diagnosed in 2012.   Current therapy: Active surveillance.  Interim History:  Mr. Pauli returns today for a repeat evaluation.  Since the last visit, the last visit, he was hospitalized in April 2020 with hematochezia and EGD was found to have a grade 1 esophageal varices.  He did have hypertensive gastropathy.  Hospitalized in July 2020 with volume overload and heart failure.  Since his discharge, he reports feeling reasonably well without any recent complaints.  He denies any nausea, fatigue or bleeding.  He denies any hematochezia, melena, epistaxis or ecchymosis.   Patient denied any alteration mental status, neuropathy, confusion or dizziness.  Denies any headaches or lethargy.  Denies any night sweats, weight loss or changes in appetite.  Denied orthopnea, dyspnea on exertion or chest discomfort.  Denies shortness of breath, difficulty breathing hemoptysis or cough.  Denies any abdominal distention, nausea, early satiety or dyspepsia.  Denies any hematuria, frequency, dysuria or nocturia.  Denies any skin irritation, dryness or rash.  Denies any ecchymosis or petechiae.  Denies any lymphadenopathy or clotting.  Denies any heat or cold intolerance.  Denies any anxiety or depression.  Remaining review of system is negative.    Medications: Updated on review. Current Outpatient Medications  Medication Sig Dispense Refill  . acetaminophen (TYLENOL) 500 MG tablet Take 1,000 mg by mouth every 6 (six) hours as needed (for pain.).    . albuterol (VENTOLIN HFA) 108 (90 Base) MCG/ACT inhaler Inhale 2 puffs into the lungs every 6 (six) hours as needed for wheezing or shortness of breath.    . allopurinol (ZYLOPRIM)  100 MG tablet Take 2 tablets (200 mg total) by mouth daily. 180 tablet 1  . Blood Pressure Monitoring (BLOOD PRESSURE CUFF) MISC Use as directed. Check blood pressure bid prn Dx:I10 1 each 0  . colchicine 0.6 MG tablet Take 0.6 mg by mouth daily as needed (gout flare).    . diphenoxylate-atropine (LOMOTIL) 2.5-0.025 MG tablet Take 1 tablet by mouth 3 (three) times daily. 60 tablet 0  . doxepin (SINEQUAN) 10 MG capsule Take 1-2 caps by mouth at bedtime as needed for sleep    . Ferrous Fumarate (HEMOCYTE) 324 (106 Fe) MG TABS tablet Take 1 tablet (106 mg of iron total) by mouth daily. 30 tablet 5  . fluticasone-salmeterol (ADVAIR HFA) 230-21 MCG/ACT inhaler Inhale 2 puffs into the lungs every morning. 48 g 1  . folic acid (FOLVITE) 1 MG tablet Take 1 tablet (1 mg total) by mouth daily. 30 tablet 5  . furosemide (LASIX) 40 MG tablet Take 2 tablets (80 mg total) by mouth every evening AND 1 tablet (40 mg total) every morning. 270 tablet 3  . hyoscyamine (LEVSIN SL) 0.125 MG SL tablet Place 0.125 mg under the tongue every 6 (six) hours as needed.    . macitentan (OPSUMIT) 10 MG tablet Take 1 tablet (10 mg total) by mouth daily. 90 tablet 0  . metolazone (ZAROXOLYN) 2.5 MG tablet Take 0.5-1 tab by mouth as needed for edema and weight gain    . mometasone-formoterol (DULERA) 100-5 MCG/ACT AERO Inhale 2 puffs into the lungs 2 (two) times daily.    . Multiple Vitamin (MULTIVITAMIN) tablet Take 1 tablet by mouth daily.    . omeprazole (PRILOSEC)   40 MG capsule Take 40 mg by mouth daily.    . potassium chloride (K-DUR) 10 MEQ tablet Take 2 tablets (20 mEq total) by mouth daily. 60 tablet 3  . rosuvastatin (CRESTOR) 5 MG tablet Take 1 tablet (5 mg total) by mouth daily. 90 tablet 3  . Selexipag (UPTRAVI) 1000 MCG TABS Take 1,000 mcg by mouth 2 (two) times daily. 60 tablet 11  . tadalafil, PAH, (ADCIRCA) 20 MG tablet Take 40 mg by mouth daily.    Marland Kitchen thiamine 100 MG tablet Take 1 tablet (100 mg total) by mouth  daily. 30 tablet 5   No current facility-administered medications for this visit.      Allergies:  Allergies  Allergen Reactions  . Aspirin Hypertension  . Nitroglycerin Other (See Comments)    Headache    Past Medical History, Surgical history, Social history, and Family History without any changes on review.  Physical Exam:  ECOG: 1   General appearance: Alert, awake without any distress. Head: Atraumatic without abnormalities Oropharynx: Without any thrush or ulcers. Eyes: No scleral icterus. Lymph nodes: No lymphadenopathy noted in the cervical, supraclavicular, or axillary nodes Heart:regular rate and rhythm, without any murmurs or gallops.    Bilateral ankle edema noted. Lung: Clear to auscultation without any rhonchi, wheezes or dullness to percussion. Abdomin: Soft, nontender without any shifting dullness or ascites. Musculoskeletal: No clubbing or cyanosis. Neurological: No motor or sensory deficits. Skin: No rashes or lesions.  No petechia or ecchymosis.     Lab Results: Lab Results  Component Value Date   WBC 1.4 Repeated and verified X2. (LL) 06/02/2019   HGB 10.1 (L) 06/02/2019   HCT 31.1 (L) 06/02/2019   MCV 104.7 (H) 06/02/2019   PLT 26.0 Repeated and verified X2. (LL) 06/02/2019     Chemistry      Component Value Date/Time   NA 136 06/16/2019 1158   NA 140 06/15/2015 1414   K 3.4 (L) 06/16/2019 1158   K 3.6 06/15/2015 1414   CL 104 06/16/2019 1158   CL 106 06/15/2015 1414   CO2 22 06/16/2019 1158   CO2 17 (L) 06/15/2015 1414   BUN 28 (H) 06/16/2019 1158   BUN 18 06/15/2015 1414   CREATININE 2.14 (H) 06/16/2019 1158   CREATININE 1.63 (H) 09/17/2017 1614      Component Value Date/Time   CALCIUM 8.5 (L) 06/16/2019 1158   CALCIUM 9.5 06/15/2015 1414   ALKPHOS 354 (H) 06/02/2019 0958   ALKPHOS 145 (H) 06/15/2015 1414   AST 63 (H) 06/02/2019 0958   AST 68 (H) 06/15/2015 1414   ALT 18 06/02/2019 0958   ALT 35 06/15/2015 1414   BILITOT 1.6  (H) 06/02/2019 0958   BILITOT 1.40 06/15/2015 1414       Impression and Plan:  68 year old man with:  1. Thrombocytopenia diagnosed in 2012 with platelet count that has fluctuated since that time with counts close to 30,000.  The differential diagnosis was reiterated again likely related to splenic sequestration.  Primary hematological etiology are considered less likely.  Myelodysplastic syndrome, TTP, DIC HUS are considered unlikely.  His CBC today continues to show platelet count of 22,000 without any active bleeding.  Management options for his thrombocytopenia were reviewed which include platelet transfusion, platelet stimulating agents as well as continued observation.  At this time he does not have any active bleeding and I recommended continued observation and use either transfusion or platelet growth factor in preparation for any future procedures. Nplate has been  used in this particular setting with reasonable success.   2.  Leukocytopenia: Normal differential at this time and consistent with a splenic sequestration related to portal hypertension and cirrhosis of the liver.  No evidence of any recurrent infections or need for growth factor support.  The role for a bone marrow biopsy was discussed today again in details.  Likelihood of having a concomitant bone marrow disorder such as myelodysplastic syndrome or lymphoproliferative disorder considered unlikely.  Risks and benefits of proceeding a bone marrow biopsy was discussed and this option was deferred for the time being.  3.  Cirrhosis of the liver: Appears to be stable without any recent decompensation.  4. Follow-up: Will be in 3 months for repeat evaluation.  15 minutes was spent with the patient face-to-face today.  More than 50% of time was spent on updating his disease status, differential diagnosis and management options.    Zola Button, MD 8/26/20209:35 AM

## 2019-06-29 NOTE — Progress Notes (Signed)
[facility] Summary Report / Loop Recorder

## 2019-07-01 LAB — BASIC METABOLIC PANEL
BUN/Creatinine Ratio: 18 (ref 10–24)
BUN: 33 mg/dL — ABNORMAL HIGH (ref 8–27)
CO2: 22 mmol/L (ref 20–29)
Calcium: 8.3 mg/dL — ABNORMAL LOW (ref 8.6–10.2)
Chloride: 102 mmol/L (ref 96–106)
Creatinine, Ser: 1.8 mg/dL — ABNORMAL HIGH (ref 0.76–1.27)
GFR calc Af Amer: 43 mL/min/{1.73_m2} — ABNORMAL LOW (ref >59)
GFR calc non Af Amer: 38 mL/min/{1.73_m2} — ABNORMAL LOW (ref >59)
Glucose: 111 mg/dL — ABNORMAL HIGH (ref 65–99)
Potassium: 3.6 mmol/L (ref 3.5–5.2)
Sodium: 140 mmol/L (ref 134–144)

## 2019-07-05 ENCOUNTER — Other Ambulatory Visit (HOSPITAL_COMMUNITY): Payer: Self-pay | Admitting: Internal Medicine

## 2019-07-12 ENCOUNTER — Ambulatory Visit (INDEPENDENT_AMBULATORY_CARE_PROVIDER_SITE_OTHER): Payer: Medicare Other | Admitting: Family Medicine

## 2019-07-12 ENCOUNTER — Telehealth (HOSPITAL_COMMUNITY): Payer: Self-pay | Admitting: *Deleted

## 2019-07-12 ENCOUNTER — Other Ambulatory Visit: Payer: Self-pay

## 2019-07-12 ENCOUNTER — Telehealth (HOSPITAL_COMMUNITY): Payer: Self-pay

## 2019-07-12 ENCOUNTER — Telehealth: Payer: Self-pay | Admitting: *Deleted

## 2019-07-12 VITALS — BP 124/73 | HR 84 | Temp 98.4°F | Resp 18 | Wt 211.2 lb

## 2019-07-12 DIAGNOSIS — E876 Hypokalemia: Secondary | ICD-10-CM

## 2019-07-12 DIAGNOSIS — I10 Essential (primary) hypertension: Secondary | ICD-10-CM

## 2019-07-12 DIAGNOSIS — Z23 Encounter for immunization: Secondary | ICD-10-CM | POA: Diagnosis not present

## 2019-07-12 LAB — COMPREHENSIVE METABOLIC PANEL
ALT: 18 U/L (ref 0–53)
AST: 62 U/L — ABNORMAL HIGH (ref 0–37)
Albumin: 3.3 g/dL — ABNORMAL LOW (ref 3.5–5.2)
Alkaline Phosphatase: 417 U/L — ABNORMAL HIGH (ref 39–117)
BUN: 42 mg/dL — ABNORMAL HIGH (ref 6–23)
CO2: 27 mEq/L (ref 19–32)
Calcium: 8.2 mg/dL — ABNORMAL LOW (ref 8.4–10.5)
Chloride: 105 mEq/L (ref 96–112)
Creatinine, Ser: 1.9 mg/dL — ABNORMAL HIGH (ref 0.40–1.50)
GFR: 42.69 mL/min — ABNORMAL LOW (ref >60.00)
Glucose, Bld: 106 mg/dL — ABNORMAL HIGH (ref 70–99)
Potassium: 3.1 mEq/L — ABNORMAL LOW (ref 3.5–5.1)
Sodium: 141 mEq/L (ref 135–145)
Total Bilirubin: 2.1 mg/dL — ABNORMAL HIGH (ref 0.2–1.2)
Total Protein: 7.1 g/dL (ref 6.0–8.3)

## 2019-07-12 LAB — CBC
HCT: 32.3 % — ABNORMAL LOW (ref 39.0–52.0)
Hemoglobin: 10.7 g/dL — ABNORMAL LOW (ref 13.0–17.0)
MCHC: 33.2 g/dL (ref 30.0–36.0)
MCV: 104.1 fl — ABNORMAL HIGH (ref 78.0–100.0)
Platelets: 31 10*3/uL — CL (ref 150.0–400.0)
RBC: 3.11 Mil/uL — ABNORMAL LOW (ref 4.22–5.81)
RDW: 18.9 % — ABNORMAL HIGH (ref 11.5–15.5)
WBC: 1.6 10*3/uL — CL (ref 4.0–10.5)

## 2019-07-12 LAB — MAGNESIUM: Magnesium: 1.4 mg/dL — ABNORMAL LOW (ref 1.5–2.5)

## 2019-07-12 MED ORDER — ROSUVASTATIN CALCIUM 5 MG PO TABS: 5.0000 mg | ORAL_TABLET | Freq: Every day | ORAL | 3 refills | Status: DC

## 2019-07-12 MED ORDER — ALLOPURINOL 100 MG PO TABS: 200.0000 mg | ORAL_TABLET | Freq: Every day | ORAL | 1 refills | Status: DC

## 2019-07-12 MED FILL — ALLOPURINOL 100 MG TABS: 100 | 90 days supply | Qty: 180 | Fill #0

## 2019-07-12 NOTE — Telephone Encounter (Signed)
Left VM for pt to return my call

## 2019-07-12 NOTE — Patient Instructions (Signed)
Edema  Edema is an abnormal buildup of fluids in the body tissues and under the skin. Swelling of the legs, feet, and ankles is a common symptom that becomes more likely as you get older. Swelling is also common in looser tissues, like around the eyes. When the affected area is squeezed, the fluid may move out of that spot and leave a dent for a few moments. This dent is called pitting edema. There are many possible causes of edema. Eating too much salt (sodium) and being on your feet or sitting for a long time can cause edema in your legs, feet, and ankles. Hot weather may make edema worse. Common causes of edema include:  Heart failure.  Liver or kidney disease.  Weak leg blood vessels.  Cancer.  An injury.  Pregnancy.  Medicines.  Being obese.  Low protein levels in the blood. Edema is usually painless. Your skin may look swollen or shiny. Follow these instructions at home:  Keep the affected body part raised (elevated) above the level of your heart when you are sitting or lying down.  Do not sit still or stand for long periods of time.  Do not wear tight clothing. Do not wear garters on your upper legs.  Exercise your legs to get your circulation going. This helps to move the fluid back into your blood vessels, and it may help the swelling go down.  Wear elastic bandages or support stockings to reduce swelling as told by your health care provider.  Eat a low-salt (low-sodium) diet to reduce fluid as told by your health care provider.  Depending on the cause of your swelling, you may need to limit how much fluid you drink (fluid restriction).  Take over-the-counter and prescription medicines only as told by your health care provider. Contact a health care provider if:  Your edema does not get better with treatment.  You have heart, liver, or kidney disease and have symptoms of edema.  You have sudden and unexplained weight gain. Get help right away if:  You develop  shortness of breath or chest pain.  You cannot breathe when you lie down.  You develop pain, redness, or warmth in the swollen areas.  You have heart, liver, or kidney disease and suddenly get edema.  You have a fever and your symptoms suddenly get worse. Summary  Edema is an abnormal buildup of fluids in the body tissues and under the skin.  Eating too much salt (sodium) and being on your feet or sitting for a long time can cause edema in your legs, feet, and ankles.  Keep the affected body part raised (elevated) above the level of your heart when you are sitting or lying down. This information is not intended to replace advice given to you by your health care provider. Make sure you discuss any questions you have with your health care provider. Document Released: 10/20/2005 Document Revised: 10/23/2017 Document Reviewed: 11/22/2016 Elsevier Patient Education  2020 [employer].

## 2019-07-12 NOTE — Telephone Encounter (Signed)
Pt called back he is aware of med change.

## 2019-07-12 NOTE — Telephone Encounter (Signed)
PT called to confirm virtual visit scheduled for 9/10 and he also advised of continued swelling in his legs.  I confirmed appt info and connected PT with triage for further assistance regarding the continued swelling.

## 2019-07-12 NOTE — Telephone Encounter (Signed)
Pt left VM stating he is supposed to call our office if his weight gets to 211lbs. Pt c/o swelling in both legs. Pt has a virtual appt on 9/10 with Dr.Bensimhon but wants to know if he needs to make medication changes until then.  Routed to [place] for advice

## 2019-07-12 NOTE — Telephone Encounter (Signed)
CRITICAL VALUE STICKER  CRITICAL VALUE:  WBC 1.6,  PLT  31. No clumps found in specimen  RECEIVER (on-site recipient of call):  Kelle Darting, CMA [place] NOTIFIED:  07/12/19 @ 1:15pm  MESSENGER (representative from lab): Roger Kill  MD NOTIFIED: Penni Homans and CMA  TIME OF NOTIFICATION: 1:16pm  RESPONSE:

## 2019-07-12 NOTE — Telephone Encounter (Signed)
Improved from last check, seen today, asymptomatic, is following with hematology. No changes

## 2019-07-12 NOTE — Progress Notes (Signed)
Subjective:    Patient ID: Darren Mueller, male    DOB: 12/19/1950, 68 y.o.   MRN: [id number]  HPI  Patient here for chronic disease follow-up. He was admitted to the hospital for CHF in July and has since followed up here, as well as with cardiology and hematology/oncology. Last month, cardiology increased his lasix to 80 mg every morning and 40 mg every evening. Patient still feels like he is swelling in his ankles. States his goal weight for cardiology is around 193 lbs, but today he is at 211 lbs.  States his breathing is at his baseline.  States he is scheduled to see cardiology later this week.  Labs done about a week and half ago with hematology/oncology. Patient states no acute concerns at that visit. Platelet count was 22,000 and Dr. Alen Blew recommended continued monitoring for now. No active bleeding.  Weighs himself every other day. Reports having 2 drinks every Friday and Saturday. States he is trying to avoid sodium in his diet. He is not exercising. States he has a lot going on as he is helping with his mom who is under Hospice care, but his mood is positive today.    Review of Systems  Constitutional: Positive for fatigue. Negative for chills and fever.  HENT: Negative for sinus pain and sore throat.   Eyes: Negative for visual disturbance.  Respiratory: Positive for shortness of breath. Negative for choking and wheezing.        Shortness of breath at baseline. Wakes up frequently at night due to OSA, but cannot tolerate CPAP.  Cardiovascular: Positive for leg swelling. Negative for chest pain.  Gastrointestinal: Positive for diarrhea. Negative for abdominal distention, blood in stool, constipation and vomiting.       Diarrhea as medication side effect. Tolerable  Endocrine: Negative for polydipsia, polyphagia and polyuria.  Genitourinary: Negative for difficulty urinating, dysuria and hematuria.  Musculoskeletal: Negative for arthralgias and myalgias.  Skin: Negative for  rash.  Neurological: Positive for headaches. Negative for light-headedness.       HA as medication side effect. Tolerable  Psychiatric/Behavioral: Negative for self-injury. The patient is not nervous/anxious.        Objective:   Physical Exam Constitutional:      Appearance: Normal appearance.  HENT:     Head: Normocephalic and atraumatic.  Cardiovascular:     Rate and Rhythm: Normal rate and regular rhythm.     Pulses: Normal pulses.     Heart sounds: Normal heart sounds.  Pulmonary:     Effort: Pulmonary effort is normal.     Breath sounds: Normal breath sounds.  Abdominal:     General: Bowel sounds are normal. There is distension.  Musculoskeletal: Normal range of motion.     Right lower leg: 3+ Edema present.     Left lower leg: 3+ Edema present.  Skin:    General: Skin is warm.     Capillary Refill: Capillary refill takes less than 2 seconds.  Neurological:     Mental Status: He is alert and oriented to person, place, and time. Mental status is at baseline.  Psychiatric:        Mood and Affect: Mood normal.        Behavior: Behavior normal.        Thought Content: Thought content normal.        Judgment: Judgment normal.    BP 124/73 (BP Location: Left Arm, Patient Position: Sitting, Cuff Size: Normal)   Pulse 84  Temp 98.4 F (36.9 C) (Temporal)   Resp 18   Wt 211 lb 3.2 oz (95.8 kg)   SpO2 100%   BMI 28.64 kg/m         Assessment & Plan:  Blood pressure is well controlled today. Patient encouraged to continue eating healthy and avoid sodium. Reminded to wear his compression socks and weigh himself regularly. Encouraged to eliminate alcohol.   Keep follow-up with cardiology for this week and remember to call them for significant weight gain or edema.

## 2019-07-12 NOTE — Telephone Encounter (Signed)
Take metolazone 2.5 mg today and tomorrow with kcl 20

## 2019-07-13 NOTE — Progress Notes (Signed)
Patient ID: Darren Mueller, male   DOB: 11/11/1950, 68 y.o.   MRN: [id number]   Subjective:    Patient ID: Darren Mueller, male    DOB: Nov 30, 1950, 68 y.o.   MRN: [id number]  No chief complaint on file.   HPI Patient is in today for follow up on chronic medical concerns including hypertension, congestive heart failure, chronic renal insufficiency. Has gained several pounds in the past week and has surpassed the weight cardiology gave him to call them to get advice. He agrees to call them today. He has an appt soon. No recent febrile illness. Feet are hurting due to swelling. He reports his alcohol consumption is down to one or two beverages on the weekend. Denies CP/palp/SOB/HA/congestion/fevers/GI or GU c/o. Taking meds as prescribed  Past Medical History:  Diagnosis Date  . Alcohol dependence ([place]) 03/22/2011   In remission   . Alcohol dependence in remission ([place]) 03/22/2011   In remission   . Alcoholic cirrhosis of liver with ascites ([place]) 2014  . Anginal pain ([place])    with SOB admitted 04/20/2019  . Cardiomegaly 02/2019   Noted on CXR  . CHF (congestive heart failure) ([place])   . CKD (chronic kidney disease), stage III ([place]) 07/18/2013  . COPD (chronic obstructive pulmonary disease) ([place])   . Coronary artery disease   . Dermatitis 06/10/2015  . Diarrhea 09/04/2013  . Dyspnea    admitted 04/20/2019  . Elbow pain, left 03/17/2017  . Eustachian tube dysfunction   . GERD (gastroesophageal reflux disease)   . Gout   . Headache   . Hearing loss   . Heart murmur   . Hx of colonic polyps   . Hypertension   . Hypertriglyceridemia 03/17/2017  . Hypopotassemia   . Otalgia 11/28/2013  . Other chronic pulmonary heart diseases   . PAH (pulmonary artery hypertension) ([facility])    RV failure  . Pancytopenia ([place])   . Pleural effusion 2014   Noted on CT:   Moderately large simple layering right pleural effusion   . Preventative health care 06/29/2016  . Pulmonary nodule 2014   Noted on CT: An 8  mm pulmonary nodule in the periphery of the left lower lobe  . Rectal bleeding   . Red eye 03/03/2016   right  . Seizures ([place])   . Sleep apnea    does not wear CPAP  . Splenomegaly 12/2015   Noted on CT  . Thoracic aorta atherosclerosis ([place]) 12/2017  . Thrombocytopenia ([place]) 06/29/2016  . Unspecified hypothyroidism 01/25/2013  . Unspecified pleural effusion   . Vertigo   . Wears glasses     Past Surgical History:  Procedure Laterality Date  . CARDIAC CATHETERIZATION N/A 12/21/2015   Procedure: Right Heart Cath;  Surgeon: Jolaine Artist, MD;  Location: [place] [facility];  Service: Cardiovascular;  Laterality: N/A;  . CARDIAC CATHETERIZATION N/A 04/15/2016   Procedure: Right/Left Heart Cath and Coronary Angiography;  Surgeon: Jolaine Artist, MD;  Location: [place];  Service: Cardiovascular;  Laterality: N/A;  . COLONOSCOPY N/A 12/08/2013   Procedure: COLONOSCOPY;  Surgeon: Milus Banister, MD;  Location: [facility];  Service: Endoscopy;  Laterality: N/A;  . COLONOSCOPY WITH PROPOFOL N/A 03/24/2019   Procedure: COLONOSCOPY WITH PROPOFOL;  Surgeon: Milus Banister, MD;  Location: [facility];  Service: Endoscopy;  Laterality: N/A;  Draw CBC in preop  . COLONOSCOPY WITH PROPOFOL N/A 04/28/2019   Procedure: COLONOSCOPY WITH PROPOFOL;  Surgeon: Milus Banister,  MD;  Location: [facility];  Service: Endoscopy;  Laterality: N/A;  . ESOPHAGOGASTRODUODENOSCOPY (EGD) WITH PROPOFOL N/A 03/27/2016   Procedure: ESOPHAGOGASTRODUODENOSCOPY (EGD) WITH PROPOFOL;  Surgeon: Milus Banister, MD;  Location: [facility];  Service: Endoscopy;  Laterality: N/A;  . ESOPHAGOGASTRODUODENOSCOPY (EGD) WITH PROPOFOL N/A 02/14/2019   Procedure: ESOPHAGOGASTRODUODENOSCOPY (EGD) WITH PROPOFOL;  Surgeon: Jerene Bears, MD;  Location: [facility] ENDOSCOPY;  Service: Gastroenterology;  Laterality: N/A;  . EYE SURGERY Bilateral 03/15/2019   lasix  . HEMOSTASIS CLIP PLACEMENT  04/28/2019   Procedure: HEMOSTASIS  CLIP PLACEMENT;  Surgeon: Milus Banister, MD;  Location: [facility];  Service: Endoscopy;;  . LOOP RECORDER INSERTION N/A 01/01/2017   Procedure: Loop Recorder Insertion;  Surgeon: Thompson Grayer, MD;  Location: [place] [facility];  Service: Cardiovascular;  Laterality: N/A;  . POLYPECTOMY  04/28/2019   Procedure: POLYPECTOMY;  Surgeon: Milus Banister, MD;  Location: [facility];  Service: Endoscopy;;  . RIGHT HEART CATHETERIZATION Right 06/06/2014   Procedure: RIGHT HEART CATH;  Surgeon: Jolaine Artist, MD;  Location: [facility];  Service: Cardiovascular;  Laterality: Right;  . UVULOPALATOPHARYNGOPLASTY  1999    Family History  Problem Relation Age of Onset  . Heart disease Mother   . Heart attack Mother   . Hypertension Mother   . Kidney failure Mother   . Liver disease Mother        stage 4 liver disease  . Prostate cancer Father   . Cancer Father        lung  . Hypertension Brother   . Gout Brother   . Alcoholism Maternal Grandfather   . Liver disease Maternal Grandfather   . Migraines Sister   . Hypertension Brother   . Colon cancer Neg Hx     Social History   Socioeconomic History  . Marital status: Divorced    Spouse name: Not on file  . Number of children: 3  . Years of education: Not on file  . Highest education level: Not on file  Occupational History  . Occupation: Retired    Comment: Microbiologist  . Financial resource strain: Not hard at all  . Food insecurity    Worry: Never true    Inability: Never true  . Transportation needs    Medical: No    Non-medical: No  Tobacco Use  . Smoking status: Former Smoker    Packs/day: 2.00    Years: 5.00    Pack years: 10.00    Types: Cigarettes    Quit date: 09/22/1979    Years since quitting: 39.8  . Smokeless tobacco: Never Used  Substance and Sexual Activity  . Alcohol use: Yes    Alcohol/week: 6.0 standard drinks    Types: 6 Shots of liquor per week    Comment: social drinker  . Drug  use: No  . Sexual activity: Yes    Birth control/protection: Spermicide  Lifestyle  . Physical activity    Days per week: 0 days    Minutes per session: 0 min  . Stress: Rather much  Relationships  . Social connections    Talks on phone: More than three times a week    Gets together: Three times a week    Attends religious service: More than 4 times per year    Active member of club or organization: Yes    Attends meetings of clubs or organizations: More than 4 times per year    Relationship status: Divorced  .  Intimate partner violence    Fear of current or ex partner: Not on file    Emotionally abused: Not on file    Physically abused: Not on file    Forced sexual activity: Not on file  Other Topics Concern  . Not on file  Social History Narrative   0 caffeine drinks daily     Outpatient Medications Prior to Visit  Medication Sig Dispense Refill  . acetaminophen (TYLENOL) 500 MG tablet Take 1,000 mg by mouth every 6 (six) hours as needed (for pain.).    Marland Kitchen albuterol (VENTOLIN HFA) 108 (90 Base) MCG/ACT inhaler Inhale 2 puffs into the lungs every 6 (six) hours as needed for wheezing or shortness of breath.    . Blood Pressure Monitoring (BLOOD PRESSURE CUFF) MISC Use as directed. Check blood pressure bid prn Dx:I10 1 each 0  . colchicine 0.6 MG tablet Take 0.6 mg by mouth daily as needed (gout flare).    . diphenoxylate-atropine (LOMOTIL) 2.5-0.025 MG tablet Take 1 tablet by mouth 3 (three) times daily. 60 tablet 0  . doxepin (SINEQUAN) 10 MG capsule Take 1-2 caps by mouth at bedtime as needed for sleep    . Ferrous Fumarate (HEMOCYTE) 324 (106 Fe) MG TABS tablet Take 1 tablet (106 mg of iron total) by mouth daily. 30 tablet 5  . fluticasone-salmeterol (ADVAIR HFA) 230-21 MCG/ACT inhaler Inhale 2 puffs into the lungs every morning. 48 g 1  . folic acid (FOLVITE) 1 MG tablet Take 1 tablet (1 mg total) by mouth daily. 30 tablet 5  . furosemide (LASIX) 40 MG tablet Take 2 tablets  (80 mg total) by mouth every evening AND 1 tablet (40 mg total) every morning. 270 tablet 3  . hyoscyamine (LEVSIN SL) 0.125 MG SL tablet Place 0.125 mg under the tongue every 6 (six) hours as needed.    . macitentan (OPSUMIT) 10 MG tablet Take 1 tablet (10 mg total) by mouth daily. 90 tablet 0  . metolazone (ZAROXOLYN) 2.5 MG tablet Take 0.5-1 tab by mouth as needed for edema and weight gain    . mometasone-formoterol (DULERA) 100-5 MCG/ACT AERO Inhale 2 puffs into the lungs 2 (two) times daily.    . Multiple Vitamin (MULTIVITAMIN) tablet Take 1 tablet by mouth daily.    Marland Kitchen omeprazole (PRILOSEC) 40 MG capsule Take 40 mg by mouth daily.    . potassium chloride (K-DUR) 10 MEQ tablet Take 2 tablets (20 mEq total) by mouth daily. 60 tablet 3  . Selexipag (UPTRAVI) 1000 MCG TABS Take 1,000 mcg by mouth 2 (two) times daily. 60 tablet 11  . tadalafil, PAH, (ADCIRCA) 20 MG tablet Take 40 mg by mouth daily.    Marland Kitchen thiamine 100 MG tablet Take 1 tablet (100 mg total) by mouth daily. 30 tablet 5  . allopurinol (ZYLOPRIM) 100 MG tablet Take 2 tablets (200 mg total) by mouth daily. 180 tablet 1  . rosuvastatin (CRESTOR) 5 MG tablet Take 1 tablet (5 mg total) by mouth daily. 90 tablet 3   No facility-administered medications prior to visit.     Allergies  Allergen Reactions  . Aspirin Hypertension  . Nitroglycerin Other (See Comments)    Headache    Review of Systems  Constitutional: Positive for malaise/fatigue. Negative for fever.  HENT: Negative for congestion.   Eyes: Negative for blurred vision.  Respiratory: Negative for shortness of breath.   Cardiovascular: Positive for leg swelling. Negative for chest pain and palpitations.  Gastrointestinal: Negative for abdominal pain,  blood in stool and nausea.  Genitourinary: Negative for dysuria and frequency.  Musculoskeletal: Negative for falls.  Skin: Negative for rash.  Neurological: Negative for dizziness, loss of consciousness and headaches.   Endo/Heme/Allergies: Negative for environmental allergies.  Psychiatric/Behavioral: Negative for depression. The patient is not nervous/anxious.        Objective:    Physical Exam Vitals signs and nursing note reviewed.  Constitutional:      General: He is not in acute distress.    Appearance: He is well-developed.  HENT:     Head: Normocephalic and atraumatic.     Nose: Nose normal.  Eyes:     General:        Right eye: No discharge.        Left eye: No discharge.  Neck:     Musculoskeletal: Normal range of motion and neck supple.  Cardiovascular:     Rate and Rhythm: Normal rate and regular rhythm.  Pulmonary:     Effort: Pulmonary effort is normal.     Breath sounds: Normal breath sounds.  Abdominal:     General: Bowel sounds are normal.     Palpations: Abdomen is soft.     Tenderness: There is no abdominal tenderness.  Musculoskeletal:        General: Swelling present.     Right lower leg: Edema present.     Left lower leg: Edema present.  Skin:    General: Skin is warm and dry.  Neurological:     Mental Status: He is alert and oriented to person, place, and time.     BP 124/73 (BP Location: Left Arm, Patient Position: Sitting, Cuff Size: Normal)   Pulse 84   Temp 98.4 F (36.9 C) (Temporal)   Resp 18   Wt 211 lb 3.2 oz (95.8 kg)   SpO2 100%   BMI 28.64 kg/m  Wt Readings from Last 3 Encounters:  07/12/19 211 lb 3.2 oz (95.8 kg)  06/29/19 207 lb 1.6 oz (93.9 kg)  06/09/19 212 lb 6.4 oz (96.3 kg)    Diabetic Foot Exam - Simple   No data filed     Lab Results  Component Value Date   WBC 1.6 Repeated and verified X2. (LL) 07/12/2019   HGB 10.7 (L) 07/12/2019   HCT 32.3 (L) 07/12/2019   PLT 31.0 Repeated and verified X2. (LL) 07/12/2019   GLUCOSE 106 (H) 07/12/2019   CHOL 124 04/13/2019   TRIG 83.0 04/13/2019   HDL 55.90 04/13/2019   LDLDIRECT 34.0 05/04/2018   LDLCALC 51 04/13/2019   ALT 18 07/12/2019   AST 62 (H) 07/12/2019   NA 141  07/12/2019   K 3.1 (L) 07/12/2019   CL 105 07/12/2019   CREATININE 1.90 (H) 07/12/2019   BUN 42 (H) 07/12/2019   CO2 27 07/12/2019   TSH 2.50 06/02/2019   PSA 0.49 11/01/2012   INR 1.2 05/16/2019   HGBA1C 4.8 04/13/2019    Lab Results  Component Value Date   TSH 2.50 06/02/2019   Lab Results  Component Value Date   WBC 1.6 Repeated and verified X2. (LL) 07/12/2019   HGB 10.7 (L) 07/12/2019   HCT 32.3 (L) 07/12/2019   MCV 104.1 (H) 07/12/2019   PLT 31.0 Repeated and verified X2. (LL) 07/12/2019   Lab Results  Component Value Date   NA 141 07/12/2019   K 3.1 (L) 07/12/2019   CO2 27 07/12/2019   GLUCOSE 106 (H) 07/12/2019   BUN 42 (H) 07/12/2019  CREATININE 1.90 (H) 07/12/2019   BILITOT 2.1 (H) 07/12/2019   ALKPHOS 417 (H) 07/12/2019   AST 62 (H) 07/12/2019   ALT 18 07/12/2019   PROT 7.1 07/12/2019   ALBUMIN 3.3 (L) 07/12/2019   CALCIUM 8.2 (L) 07/12/2019   ANIONGAP 10 06/16/2019   GFR 42.69 (L) 07/12/2019   Lab Results  Component Value Date   CHOL 124 04/13/2019   Lab Results  Component Value Date   HDL 55.90 04/13/2019   Lab Results  Component Value Date   LDLCALC 51 04/13/2019   Lab Results  Component Value Date   TRIG 83.0 04/13/2019   Lab Results  Component Value Date   CHOLHDL 2 04/13/2019   Lab Results  Component Value Date   HGBA1C 4.8 04/13/2019       Assessment & Plan:   Problem List Items Addressed This Visit    Hypertension (Chronic)    Well controlled, no changes to meds. Encouraged heart healthy diet such as the DASH diet and exercise as tolerated.       Relevant Medications   rosuvastatin (CRESTOR) 5 MG tablet   Other Relevant Orders   CBC (Completed)   Magnesium (Completed)   Hypokalemia   Relevant Orders   Comprehensive metabolic panel (Completed)    Other Visit Diagnoses    Needs flu shot    -  Primary   Relevant Orders   Flu Vaccine QUAD High Dose(Fluad) (Completed)   Hypocalcemia       Relevant Orders    Comprehensive metabolic panel (Completed)      I am having [name] "Col" maintain his diphenoxylate-atropine, fluticasone-salmeterol, Blood Pressure Cuff, acetaminophen, colchicine, Opsumit, Ferrous Fumarate, folic acid, thiamine, Uptravi, omeprazole, multivitamin, mometasone-formoterol, doxepin, albuterol, hyoscyamine, metolazone, tadalafil (PAH), furosemide, potassium chloride, allopurinol, and rosuvastatin.  Meds ordered this encounter  Medications  . allopurinol (ZYLOPRIM) 100 MG tablet    Sig: Take 2 tablets (200 mg total) by mouth daily.    Dispense:  180 tablet    Refill:  1  . rosuvastatin (CRESTOR) 5 MG tablet    Sig: Take 1 tablet (5 mg total) by mouth daily.    Dispense:  90 tablet    Refill:  3     Penni Homans, MD

## 2019-07-13 NOTE — Assessment & Plan Note (Signed)
Well controlled, no changes to meds. Encouraged heart healthy diet such as the DASH diet and exercise as tolerated.

## 2019-07-13 NOTE — Telephone Encounter (Signed)
Noted.

## 2019-07-14 ENCOUNTER — Ambulatory Visit (HOSPITAL_COMMUNITY)
Admission: RE | Admit: 2019-07-14 | Discharge: 2019-07-14 | Disposition: A | Discharge status: Home / Self Care | Payer: Medicare Other | Source: Ambulatory Visit | Attending: Internal Medicine | Admitting: Internal Medicine

## 2019-07-14 ENCOUNTER — Other Ambulatory Visit: Payer: Self-pay

## 2019-07-14 ENCOUNTER — Encounter (HOSPITAL_COMMUNITY): Payer: Self-pay

## 2019-07-14 DIAGNOSIS — I2721 Secondary pulmonary arterial hypertension: Secondary | ICD-10-CM

## 2019-07-14 DIAGNOSIS — K766 Portal hypertension: Secondary | ICD-10-CM

## 2019-07-14 DIAGNOSIS — N183 Chronic kidney disease, stage 3 unspecified: Secondary | ICD-10-CM

## 2019-07-14 DIAGNOSIS — E876 Hypokalemia: Secondary | ICD-10-CM

## 2019-07-14 MED ORDER — POTASSIUM CHLORIDE ER 10 MEQ PO TBCR: 40.0000 meq | EXTENDED_RELEASE_TABLET | Freq: Every day | ORAL | 3 refills | Status: DC

## 2019-07-14 MED ORDER — MAG-OXIDE 200 MG PO TABS: 400.0000 mg | ORAL_TABLET | Freq: Every day | ORAL | 3 refills | Status: DC

## 2019-07-14 MED ORDER — TORSEMIDE 20 MG PO TABS: ORAL_TABLET | ORAL | 3 refills | Status: DC

## 2019-07-14 MED FILL — POTASSIUM CHL ER M10 TABLET: 10 | 30 days supply | Qty: 120 | Fill #0

## 2019-07-14 MED FILL — TORSEMIDE 20 MG TABLET: 20 | 90 days supply | Qty: 270 | Fill #0

## 2019-07-14 NOTE — Patient Instructions (Addendum)
Lab work needs to be done in 1 week. Your appointment is for Sept 17th at 10:30am. The parking code for September is 9007.  STOP Furosemide  START Torsemide 85m every morning and 439mevery evening.  START Mag Oxide 40074m2 tabs) daily.  INCREASE Potassium to 7m35m tabs) daily. FOR TODAY ONLY please take 80mg54mtabs).  Please keep follow up telehealth visit Oct. 1st at 3pm.  At the [facility] and your health needs are our priority. As part of our continuing mission to provide you with exceptional heart care, we have created designated Provider Care Teams. These Care Teams include your primary Cardiologist (physician) and Advanced Practice Providers (APPs- Physician Assistants and Nurse Practitioners) who all work together to provide you with the care you need, when you need it.   You may see any of the following providers on your designated Care Team at your next follow up: . Dr Marland KitchenanieGlori Bickers DaltoLoralie Champagney CDarrick Grinder  Please be sure to bring in all your medications bottles to every appointment.

## 2019-07-14 NOTE — Progress Notes (Addendum)
1) Change lasix to torsemide 20 in am and 40 in pm  2) Take Kcl 80 meq today (8 tablets) and increase daily potassium to 40 me daily (4 tabs)  3) Start mag oxide 487m daily  4) Check BMET & magnesium next week  5) televisit with me in 2 weeks.    Called and spoke with the pt. Reviewed medication changes and made follow up appointment. No further questions at this time. Pt verbalized understanding. Instructions sent via [employer]

## 2019-07-14 NOTE — Progress Notes (Signed)
Heart Failure TeleHealth Note  Due to national recommendations of social distancing due to COVID 19, Audio/video telehealth visit is felt to be most appropriate for this patient at this time.  See MyChart message from today for patient consent regarding telehealth for Darren [facility].  Date:  07/14/2019   ID:  Darren [facility], DOB May 09, 1951, MRN [id number]  Location: Home  Provider location: [facility] Type of Visit: Established patient  PCP:  [name], MD  Cardiologist:  [name], MD Primary HF: Ailani Governale  Chief Complaint: Heart Failure follow-up   History of Present Illness:  Darren [facility] ("the Darren [facility]") is a 68yo with history of COPD, portopulmonary hypertension with RV failure, and ETOH cirrhosis.  Admitted in 6/17 with CP and SOB. Underwent R/L cath. Minimal CAD with moderate PAH and preserved cardiac output.   Admitted 06/2016 and 07/2016 with syncope. On September admission found to have elevated ETOH level as well as R 6th rib fracture. Wore 30 day monitor up until 08/28/16. No AF noted. No events.   Admitted February2018after syncopal event. LINQ placed.   Was admitted 4/20 with hematochezia and melena. His hemoglobin remained stable. No episodes of GI bleed after hospitalization. Underwent EGD by GI with finding of Grade 1 and a small esophageal varices, no evidence of recent bleeding,portal hypertensive gastropathy. Started on nadolol which made him very dizzy. He stopped this and feels a lot   Admitted  7/13-16/20 with volume overload. Diuresed 21 pounds. D/c weight 192.  Echo  05/17/19: LVEF > 65% RV severely dilated with moderate RV dysfunction. Moderate TR RVSP . While in [place] was pancytopenic. Possibility of BMBx discussed with Dr. Clelia Croft but felt it was not necessary as it was felt he had splenic sequestration and counts were low but very stable.  At recent visit lasix increased to 40/80.   He  presents via Web designer for a telehealth visit today. A few days ago he called with volume overload and was instructed to take metolazone. Weight 211-> 203.. Says edema is better but still not resolved. Breathing ok. No problem with ADLs. No orthopnea or PND. Taking all meds as prescribed. No dizziness or syncope. No bleeding. He is having HAs.    PAH meds 1) Macitnentan 10 mg daily 2) Adcirca 40 mg daily  3) Selexipeg 1000/1200 daily   Studies: ECHO 12/14 EF 55-60% Peak PA pressure 35. Severe RV dysfunction  ECHO 7/15 EF 60% RV moderately to severely dilated. Moderate HK RVSP 57mm HG  ECHO 6/16 EF 60% RV moderately to severely dilated. Severe HK RVSP ~65 mm HG. D-shaped septum Echo 2/17 LVEF 60-65% RV massively dilated. Flat septum. Severe HK. Moderate TR RVSP ~65. IVC small. No effusion  ECHO 01/01/2017 EF 50-55% Grade I DD. RV severely dilated. Peak PA pressure 62 mm hg Echo 9/19 with EF 60-65% RV markedly dilated and hypokinetic with D-shaped septum. RVSP  Cath 6/17 Mid RCA lesion, 20% stenosed. Dist LAD lesion, 20% stenosed. Ao = 107/70 (87) LV = 106/3/11 RA = 4 RV = 74/11 PA = 74/26 (45) PCW = 6 Fick cardiac output/index = 4.6.2.2 PVR = 8.5 Ao sat = 95% PA sat = 61%, 58%  RHC 2/17 RA = 11 RV = 80/16/19 PA = 83/61 (71) PCW = 9 Fick cardiac output/index = 5.6/2.6 PVR = 10.3 WU Ao sat = 94% PA sat = 65%, 68% High SVC sat = 65%   PFTs 3/14 FEV1 2.02 L (58%)  FVC 2.7L (54%) FEV1/FVC (89%) DLCO 53%  PFTs 1/17 FEV1 2.05 L (61%) FVC 2.48L (56%) DLCO 44%   Ab u/s 8/16: + cirrhosis/mild to moderate splenomegaly  6 min walk 01/10/13, 1290 feet  (01/10/14) = 1280 feet (380 m) (9/15) = 1350 feet (411 m) O2 sats ranged from 89-95% on room air, HR ranged from 100-129. (6/16) = 384 meters  (2/17) = 1250 feet (352m) (8/17) = 1320 feet    Darren [facility] denies symptoms worrisome for COVID 19.   Past Medical  History:  Diagnosis Date  . Alcohol dependence (HCC) 03/22/2011   In remission   . Alcohol dependence in remission (HCC) 03/22/2011   In remission   . Alcoholic cirrhosis of liver with ascites (HCC) 2014  . Anginal pain (HCC)    with SOB admitted 04/20/2019  . Cardiomegaly 02/2019   Noted on CXR  . CHF (congestive heart failure) (HCC)   . CKD (chronic kidney disease), stage III (HCC) 07/18/2013  . COPD (chronic obstructive pulmonary disease) (HCC)   . Coronary artery disease   . Dermatitis 06/10/2015  . Diarrhea 09/04/2013  . Dyspnea    admitted 04/20/2019  . Elbow pain, left 03/17/2017  . Eustachian tube dysfunction   . GERD (gastroesophageal reflux disease)   . Gout   . Headache   . Hearing loss   . Heart murmur   . Hx of colonic polyps   . Hypertension   . Hypertriglyceridemia 03/17/2017  . Hypopotassemia   . Otalgia 11/28/2013  . Other chronic pulmonary heart diseases   . PAH (pulmonary artery hypertension) (HCC)    RV failure  . Pancytopenia (HCC)   . Pleural effusion 2014   Noted on CT:   Moderately large simple layering right pleural effusion   . Preventative health care 06/29/2016  . Pulmonary nodule 2014   Noted on CT: An 8 mm pulmonary nodule in the periphery of the left lower lobe  . Rectal bleeding   . Red eye 03/03/2016   right  . Seizures (HCC)   . Sleep apnea    does not wear CPAP  . Splenomegaly 12/2015   Noted on CT  . Thoracic aorta atherosclerosis (HCC) 12/2017  . Thrombocytopenia (HCC) 06/29/2016  . Unspecified hypothyroidism 01/25/2013  . Unspecified pleural effusion   . Vertigo   . Wears glasses    Past Surgical History:  Procedure Laterality Date  . CARDIAC CATHETERIZATION N/A 12/21/2015   Procedure: Right Heart Cath;  Surgeon: Dolores Patty, MD;  Location: [facility] INVASIVE CV LAB;  Service: Cardiovascular;  Laterality: N/A;  . CARDIAC CATHETERIZATION N/A 04/15/2016   Procedure: Right/Left Heart Cath and Coronary Angiography;  Surgeon: Dolores Patty, MD;  Location: [facility]VASIVE CV LAB;  Service: Cardiovascular;  Laterality: N/A;  . COLONOSCOPY N/A 12/08/2013   Procedure: COLONOSCOPY;  Surgeon: Rachael Fee, MD;  Location: [facility] ENDOSCOPY;  Service: Endoscopy;  Laterality: N/A;  . COLONOSCOPY WITH PROPOFOL N/A 03/24/2019   Procedure: COLONOSCOPY WITH PROPOFOL;  Surgeon: Rachael Fee, MD;  Location: [facility] ENDOSCOPY;  Service: Endoscopy;  Laterality: N/A;  Draw CBC in preop  . COLONOSCOPY WITH PROPOFOL N/A 04/28/2019   Procedure: COLONOSCOPY WITH PROPOFOL;  Surgeon: Rachael Fee, MD;  Location: [facility] ENDOSCOPY;  Service: Endoscopy;  Laterality: N/A;  . ESOPHAGOGASTRODUODENOSCOPY (EGD) WITH PROPOFOL N/A 03/27/2016   Procedure: ESOPHAGOGASTRODUODENOSCOPY (EGD) WITH PROPOFOL;  Surgeon: Rachael Fee, MD;  Location[facility] ENDOSCOPY;  Service: Endoscopy;  Laterality:  N/A;  . ESOPHAGOGASTRODUODENOSCOPY (EGD) WITH PROPOFOL N/A 02/14/2019   Procedure: ESOPHAGOGASTRODUODENOSCOPY (EGD) WITH PROPOFOL;  Surgeon: Beverley Fiedler, MD;  Location: [facility] ENDOSCOPY;  Service: Gastroenterology;  Laterality: N/A;  . EYE SURGERY Bilateral 03/15/2019   lasix  . HEMOSTASIS CLIP PLACEMENT  04/28/2019   Procedure: HEMOSTASIS CLIP PLACEMENT;  Surgeon: Rachael Fee, MD;  Location: [facility] ENDOSCOPY;  Service: Endoscopy;;  . LOOP RECORDER INSERTION N/A 01/01/2017   Procedure: Loop Recorder Insertion;  Surgeon: Hillis Range, MD;  Location: [facility] INVASIVE CV LAB;  Service: Cardiovascular;  Laterality: N/A;  . POLYPECTOMY  04/28/2019   Procedure: POLYPECTOMY;  Surgeon: Rachael Fee, MD;  Location: [facility] ENDOSCOPY;  Service: Endoscopy;;  . RIGHT HEART CATHETERIZATION Right 06/06/2014   Procedure: RIGHT HEART CATH;  Surgeon: Dolores Patty, MD;  Location: [facility] CATH LAB;  Service: Cardiovascular;  Laterality: Right;  . UVULOPALATOPHARYNGOPLASTY  1999     Current Outpatient Medications  Medication Sig Dispense Refill  . acetaminophen (TYLENOL) 500 MG tablet Take 1,000 mg by mouth  every 6 (six) hours as needed (for pain.).    Marland Kitchen albuterol (VENTOLIN HFA) 108 (90 Base) MCG/ACT inhaler Inhale 2 puffs into the lungs every 6 (six) hours as needed for wheezing or shortness of breath.    . allopurinol (ZYLOPRIM) 100 MG tablet Take 2 tablets (200 mg total) by mouth daily. 180 tablet 1  . Blood Pressure Monitoring (BLOOD PRESSURE CUFF) MISC Use as directed. Check blood pressure bid prn Dx:I10 1 each 0  . colchicine 0.6 MG tablet Take 0.6 mg by mouth daily as needed (gout flare).    . diphenoxylate-atropine (LOMOTIL) 2.5-0.025 MG tablet Take 1 tablet by mouth 3 (three) times daily. 60 tablet 0  . doxepin (SINEQUAN) 10 MG capsule Take 1-2 caps by mouth at bedtime as needed for sleep    . Ferrous Fumarate (HEMOCYTE) 324 (106 Fe) MG TABS tablet Take 1 tablet (106 mg of iron total) by mouth daily. 30 tablet 5  . fluticasone-salmeterol (ADVAIR HFA) 230-21 MCG/ACT inhaler Inhale 2 puffs into the lungs every morning. 48 g 1  . folic acid (FOLVITE) 1 MG tablet Take 1 tablet (1 mg total) by mouth daily. 30 tablet 5  . furosemide (LASIX) 40 MG tablet Take 2 tablets (80 mg total) by mouth every evening AND 1 tablet (40 mg total) every morning. 270 tablet 3  . hyoscyamine (LEVSIN SL) 0.125 MG SL tablet Place 0.125 mg under the tongue every 6 (six) hours as needed.    . macitentan (OPSUMIT) 10 MG tablet Take 1 tablet (10 mg total) by mouth daily. 90 tablet 0  . metolazone (ZAROXOLYN) 2.5 MG tablet Take 0.5-1 tab by mouth as needed for edema and weight gain    . mometasone-formoterol (DULERA) 100-5 MCG/ACT AERO Inhale 2 puffs into the lungs 2 (two) times daily.    . Multiple Vitamin (MULTIVITAMIN) tablet Take 1 tablet by mouth daily.    Marland Kitchen omeprazole (PRILOSEC) 40 MG capsule Take 40 mg by mouth daily.    . potassium chloride (K-DUR) 10 MEQ tablet Take 2 tablets (20 mEq total) by mouth daily. 60 tablet 3  . rosuvastatin (CRESTOR) 5 MG tablet Take 1 tablet (5 mg total) by mouth daily. 90 tablet 3  .  Selexipag (UPTRAVI) 1000 MCG TABS Take 1,000 mcg by mouth 2 (two) times daily. 60 tablet 11  . tadalafil, PAH, (ADCIRCA) 20 MG tablet Take 40 mg by mouth daily.    Marland Kitchen thiamine 100 MG tablet  Take 1 tablet (100 mg total) by mouth daily. 30 tablet 5   No current facility-administered medications for this encounter.     Allergies:   Aspirin and Nitroglycerin   Social History:  The patient  reports that he quit smoking about 39 years ago. His smoking use included cigarettes. He has a 10.00 pack-year smoking history. He has never used smokeless tobacco. He reports current alcohol use of about 6.0 standard drinks of alcohol per week. He reports that he does not use drugs.   Family History:  The patient's family history includes Alcoholism in his maternal grandfather; Cancer in his father; Gout in his brother; Heart attack in his mother; Heart disease in his mother; Hypertension in his brother, brother, and mother; Kidney failure in his mother; Liver disease in his maternal grandfather and mother; Migraines in his sister; Prostate cancer in his father.   ROS:  Please see the history of present illness.   All other systems are personally reviewed and negative.   Exam:  (Video/Tele Health Call; Exam is subjective and or/visual.) General:  Speaks in full sentences. No resp difficulty. Lungs: Normal respiratory effort with conversation.  Abdomen: mildy distended per patient report Extremities: Pt endorses mild edema. Neuro: Alert & oriented x 3.   Recent Labs: 05/16/2019: B Natriuretic Peptide 1,119.2; Pro B Natriuretic peptide (BNP) 1,293.0 06/02/2019: TSH 2.50 07/12/2019: ALT 18; BUN 42; Creatinine, Ser 1.90; Hemoglobin 10.7; Magnesium 1.4; Platelets 31.0 Repeated and verified X2.; Potassium 3.1; Sodium 141  Personally reviewed   Wt Readings from Last 3 Encounters:  07/12/19 95.8 kg (211 lb 3.2 oz)  06/29/19 93.9 kg (207 lb 1.6 oz)  06/09/19 96.3 kg (212 lb 6.4 oz)      ASSESSMENT AND PLAN:  1.  PAH: Suspected portopulmonary HTN in setting of ETOH cirrhosis. - Stable NYHA III. - 9/19 1280 feet (390 meters) - Echo 9/19 with EF 60-65% RV markedly dilated and hypokinetic with D-shaped septum - Echo  05/17/19: LVEF > 65% RV severely dilated with moderate RV dysfunction. Septal flattening Moderate TR RVSP . - Continue selexipag 1000 mcg twice a day. Intolerant higher dose due to diarrhea and headaches .  - Continue macitentan 10 daily and adcirca 40 mg daily. - With RV strain on echo may need to consider repeat RHC and  IV therapies in the near future.    2. Diastolic HF - Volume status improving but still seems elevated likely R>L HF - Switch Lasix 40/80 to torsemide 20/40 (can't take more in am as he is caring for his mom) - I think baseline weight really closer to 190-192 - check labs next week - f/u televist in 2 weeks   3. CKD III:  -Baseline creatinine 1.7-2.0. Was 1.9 on 07/12/19. Recehck next week.   - Followed by Dr. Arrie Aran   4. Cirrhosis:  - Likely combination of RV failure and ETOH - Follows with Dr. Christella Hartigan. - Did not tolerate nadolol.  - EGD 4/20 stable - Stressed need for ETOH cessation  5. Recurrent syncope/seizure - LINQ interrogations have shown no arrhythmias and thus most likely seizure - Continue Keppra. Follows with Dr. Karel Jarvis  6. HTN:  -.Blood pressure well controlled. Continue current regimen.  7. OSA:  - Has mild OSA with AHI 12 and desats down to 82%.  - Intolerant CPAP.   8. Pancytopenia:  - Has seen Dr. Clelia Croft in Hematology - felt like it is due to splenic sequestration.  - No role for BMBx current per Dr.  Shadad    9. Hypokalemia - recent potassium 3.1 on 9/8 - take 80 meq today (8 tablets) and increase to daily (4 tablets)  10. Hypomag - start magOx 400 daily  COVID screen The patient does not have any symptoms that suggest any further testing/ screening at this time.  Social distancing reinforced today.   Patient Risk: After full review of this patients clinical status, I feel that they are at moderate risk for cardiac decompensation at this time.  Relevant cardiac medications were reviewed at length with the patient today. The patient does not have concerns regarding their medications at this time.   The following changes were made today:  None  Recommended follow-up:  Dr Gala Romney in 3 months with an ECHO   Today, I have spent 18 minutes with the patient with telehealth technology discussing the above issues .    Signed, [name], MD  07/14/2019 3:29 PM  [facility] [address] Heart and Vascular [place] [address] [phone] (office) [phone] (fax)

## 2019-07-14 NOTE — Addendum Note (Signed)
Encounter addended by: Marlise Eves, RN on: 07/14/2019 4:04 PM  Actions taken: Medication long-term status modified, Pharmacy for encounter modified, Order list changed, Diagnosis association updated, Clinical Note Signed

## 2019-07-19 ENCOUNTER — Encounter: Payer: Self-pay | Admitting: Family Medicine

## 2019-07-20 ENCOUNTER — Ambulatory Visit (HOSPITAL_COMMUNITY)
Admission: RE | Admit: 2019-07-20 | Discharge: 2019-07-20 | Disposition: A | Discharge status: Home / Self Care | Payer: Medicare Other | Source: Ambulatory Visit | Attending: Cardiology | Admitting: Cardiology

## 2019-07-20 ENCOUNTER — Other Ambulatory Visit: Payer: Self-pay

## 2019-07-20 DIAGNOSIS — K766 Portal hypertension: Secondary | ICD-10-CM | POA: Insufficient documentation

## 2019-07-20 DIAGNOSIS — I2721 Secondary pulmonary arterial hypertension: Secondary | ICD-10-CM

## 2019-07-20 LAB — BASIC METABOLIC PANEL
Anion gap: 8 (ref 5–15)
BUN: 44 mg/dL — ABNORMAL HIGH (ref 8–23)
CO2: 24 mmol/L (ref 22–32)
Calcium: 8.3 mg/dL — ABNORMAL LOW (ref 8.9–10.3)
Chloride: 106 mmol/L (ref 98–111)
Creatinine, Ser: 2.34 mg/dL — ABNORMAL HIGH (ref 0.61–1.24)
GFR calc Af Amer: 32 mL/min — ABNORMAL LOW (ref >60)
GFR calc non Af Amer: 27 mL/min — ABNORMAL LOW (ref >60)
Glucose, Bld: 126 mg/dL — ABNORMAL HIGH (ref 70–99)
Potassium: 3 mmol/L — ABNORMAL LOW (ref 3.5–5.1)
Sodium: 138 mmol/L (ref 135–145)

## 2019-07-20 LAB — MAGNESIUM: Magnesium: 1.7 mg/dL (ref 1.7–2.4)

## 2019-07-21 ENCOUNTER — Other Ambulatory Visit (HOSPITAL_COMMUNITY): Payer: Medicare Other

## 2019-07-22 ENCOUNTER — Other Ambulatory Visit (HOSPITAL_COMMUNITY): Payer: Self-pay

## 2019-07-22 ENCOUNTER — Other Ambulatory Visit (HOSPITAL_COMMUNITY): Payer: Medicare Other

## 2019-07-22 ENCOUNTER — Encounter (HOSPITAL_COMMUNITY): Payer: Medicare Other | Admitting: Internal Medicine

## 2019-07-22 MED ORDER — TADALAFIL (PAH) 20 MG PO TABS: 40.0000 mg | ORAL_TABLET | Freq: Every day | ORAL | 1 refills | Status: DC

## 2019-07-25 ENCOUNTER — Ambulatory Visit (INDEPENDENT_AMBULATORY_CARE_PROVIDER_SITE_OTHER): Payer: Medicare Other | Admitting: *Deleted

## 2019-07-25 ENCOUNTER — Encounter (HOSPITAL_COMMUNITY): Payer: Self-pay

## 2019-07-25 ENCOUNTER — Telehealth (HOSPITAL_COMMUNITY): Payer: Self-pay

## 2019-07-25 DIAGNOSIS — R55 Syncope and collapse: Secondary | ICD-10-CM | POA: Diagnosis not present

## 2019-07-25 DIAGNOSIS — I5032 Chronic diastolic (congestive) heart failure: Secondary | ICD-10-CM

## 2019-07-25 LAB — CUP PACEART REMOTE DEVICE CHECK
Date Time Interrogation Session: 20200920153720
Implantable Pulse Generator Implant Date: 20180301

## 2019-07-25 MED ORDER — POTASSIUM CHLORIDE ER 10 MEQ PO TBCR: 60.0000 meq | EXTENDED_RELEASE_TABLET | Freq: Every day | ORAL | 3 refills | Status: DC

## 2019-07-25 MED FILL — POTASSIUM CHL ER M10 TABLET: 10 | 45 days supply | Qty: 270 | Fill #0

## 2019-07-25 MED FILL — TADALAFIL (PAH) 20 MG TABS: 20 | 45 days supply | Qty: 90 | Fill #0

## 2019-07-25 NOTE — Telephone Encounter (Signed)
-----  Message from Jolaine Artist, MD sent at 07/23/2019  8:30 PM EDT ----- Please have him take extra kcl 60 and increase current potassium dose by 20 meq per day. Recheck 1 week,. thanks

## 2019-07-25 NOTE — Telephone Encounter (Signed)
Spoke with patient, aware of lab results with k 3.0.  Advised need to take extra potassium 60 meq today.  Starting tomorrow, he will add an extra 20 meq daily for a total of 60 meq daily.  Pt will call office tomorrow to schedule his lab appt for next week.  His mother died and he is in the process of planning her services.  Dr Haroldine Laws made aware of same. Instructions also sent via mychart per patient request.

## 2019-08-01 NOTE — Progress Notes (Signed)
[facility] Summary Report / Loop Recorder

## 2019-08-04 ENCOUNTER — Ambulatory Visit (HOSPITAL_COMMUNITY)
Admission: RE | Admit: 2019-08-04 | Discharge: 2019-08-04 | Disposition: A | Discharge status: Home / Self Care | Payer: Medicare Other | Source: Ambulatory Visit | Attending: Internal Medicine | Admitting: Internal Medicine

## 2019-08-04 ENCOUNTER — Encounter (HOSPITAL_COMMUNITY): Payer: Self-pay

## 2019-08-04 ENCOUNTER — Other Ambulatory Visit: Payer: Self-pay

## 2019-08-04 DIAGNOSIS — I5032 Chronic diastolic (congestive) heart failure: Secondary | ICD-10-CM | POA: Diagnosis not present

## 2019-08-04 DIAGNOSIS — N1832 Chronic kidney disease, stage 3b: Secondary | ICD-10-CM

## 2019-08-04 DIAGNOSIS — I2721 Secondary pulmonary arterial hypertension: Secondary | ICD-10-CM

## 2019-08-04 DIAGNOSIS — I2729 Other secondary pulmonary hypertension: Secondary | ICD-10-CM | POA: Diagnosis not present

## 2019-08-04 DIAGNOSIS — E876 Hypokalemia: Secondary | ICD-10-CM | POA: Diagnosis not present

## 2019-08-04 DIAGNOSIS — K7031 Alcoholic cirrhosis of liver with ascites: Secondary | ICD-10-CM

## 2019-08-04 MED ORDER — METOLAZONE 2.5 MG PO TABS: 2.5000 mg | ORAL_TABLET | ORAL | 0 refills | Status: DC

## 2019-08-04 MED FILL — metOLazone 2.5 MG TABS: 2.5 | 30 days supply | Qty: 30 | Fill #0

## 2019-08-04 NOTE — Addendum Note (Signed)
Encounter addended by: Valeda Malm, RN on: 08/04/2019 3:29 PM  Actions taken: Clinical Note Signed

## 2019-08-04 NOTE — Patient Instructions (Addendum)
Take Metolazone 2.97m (1 tab) daily for 2 -3 days.  Take each dose with Potassium 40 meq (4 tabs).    You can take Metolazone 2.520m(1 tab) 1-2 times a week to keep your wight <205lb. Take an extra Potassium 40 meq when you take Metolazone.  [phone] will only contact you if something comes back abnormal or we need to make some changes. Otherwise no news is good news!  Your physician recommends that you schedule a follow-up appointment in: 2-3 telehealth visit with Dr BeHaroldine LawsThe scheduler will call you to arrange your appointment.    [facility] and your [facility] needs are our priority. As part of our continuing mission to provide you with exceptional heart care, we have created designated Provider Care Teams. These Care Teams include your primary Cardiologist (physician) and Advanced Practice Providers (APPs- Physician Assistants and Nurse Practitioners) who all work together to provide you with the care you need, when you need it.   You may see any of the following providers on your designated Care Team at your next follow up: . Marland Kitchenr DaGlori Bickers Dr DaLoralie Champagne AmDarrick GrinderNP   Please be sure to bring in all your medications bottles to every appointment.

## 2019-08-04 NOTE — Addendum Note (Signed)
Encounter addended by: Valeda Malm, RN on: 08/04/2019 2:42 PM  Actions taken: Pharmacy for encounter modified, Order list changed, Diagnosis association updated, Clinical Note Signed

## 2019-08-04 NOTE — Progress Notes (Signed)
Spoke with patient, avs reviewed.  All questions answered, verbalized understanding. Information sent to patient via mychart as requested.

## 2019-08-04 NOTE — Progress Notes (Signed)
Heart Failure TeleHealth Note  Due to national recommendations of social distancing due to COVID 19, Audio/video telehealth visit is felt to be most appropriate for this patient at this time.  See MyChart message from today for patient consent regarding telehealth for [facility].  Date:  08/04/2019   ID:  Darren Mueller, DOB 14-Jan-1951, MRN [id number]  Location: Home  Provider location: [facility] Type of Visit: Established patient  PCP:  [name], MD  Cardiologist:  [name], MD Primary HF: Lennox Leikam  Chief Complaint: Heart Failure follow-up   History of Present Illness:  Darren Mueller ("[name]") is a 68yo with history of COPD, portopulmonary hypertension with RV failure, and ETOH cirrhosis.  Admitted in 6/17 with CP and SOB. Underwent R/L cath. Minimal CAD with moderate PAH and preserved cardiac output.   Admitted 06/2016 and 07/2016 with syncope. On September admission found to have elevated ETOH level as well as R 6th rib fracture. Wore 30 day monitor up until 08/28/16. No AF noted. No events.   Admitted February2018after syncopal event. LINQ placed.   Was admitted 4/20 with hematochezia and melena. His hemoglobin remained stable. No episodes of GI bleed after hospitalization. Underwent EGD by GI with finding of Grade 1 and a small esophageal varices, no evidence of recent bleeding,portal hypertensive gastropathy. Started on nadolol which made him very dizzy. He stopped this and feels a lot   Admitted  7/13-16/20 with volume overload. Diuresed 21 pounds. D/c weight 192.  Echo  05/17/19: LVEF > 65% RV severely dilated with moderate RV dysfunction. Moderate TR RVSP . While in hospital was pancytopenic. Possibility of BMBx discussed with Dr. Clelia Croft but felt it was not necessary as it was felt he had splenic sequestration and counts were low but very stable.  He presents via Web designer for a  telehealth visit today. His Mother passed last Sunday at age 64. Recently struggling with volume overload. Took metolazone. Weight 211-> 203. Weight back up to 210. Says he missed some doses with his mother's passing. + worsening edema and DOE.  No problem with ADLs. No orthopnea or PND. No bleeding. Now on torsemide 40 bid.     PAH meds 1) Macitnentan 10 mg daily 2) Adcirca 40 mg daily  3) Selexipeg 1000/1200 daily   Studies: ECHO 12/14 EF 55-60% Peak PA pressure 35. Severe RV dysfunction  ECHO 7/15 EF 60% RV moderately to severely dilated. Moderate HK RVSP 57mm HG  ECHO 6/16 EF 60% RV moderately to severely dilated. Severe HK RVSP ~65 mm HG. D-shaped septum Echo 2/17 LVEF 60-65% RV massively dilated. Flat septum. Severe HK. Moderate TR RVSP ~65. IVC small. No effusion  ECHO 01/01/2017 EF 50-55% Grade I DD. RV severely dilated. Peak PA pressure 62 mm hg Echo 9/19 with EF 60-65% RV markedly dilated and hypokinetic with D-shaped septum. RVSP  Cath 6/17 Mid RCA lesion, 20% stenosed. Dist LAD lesion, 20% stenosed. Ao = 107/70 (87) LV = 106/3/11 RA = 4 RV = 74/11 PA = 74/26 (45) PCW = 6 Fick cardiac output/index = 4.6.2.2 PVR = 8.5 Ao sat = 95% PA sat = 61%, 58%  RHC 2/17 RA = 11 RV = 80/16/19 PA = 83/61 (71) PCW = 9 Fick cardiac output/index = 5.6/2.6 PVR = 10.3 WU Ao sat = 94% PA sat = 65%, 68% High SVC sat = 65%   PFTs 3/14 FEV1 2.02 L (58%) FVC 2.7L (54%) FEV1/FVC (89%) DLCO  53%  PFTs 1/17 FEV1 2.05 L (61%) FVC 2.48L (56%) DLCO 44%   Ab u/s 8/16: + cirrhosis/mild to moderate splenomegaly  6 min walk 01/10/13, 1290 feet  (01/10/14) = 1280 feet (380 m) (9/15) = 1350 feet (411 m) O2 sats ranged from 89-95% on room air, HR ranged from 100-129. (6/16) = 384 meters  (2/17) = 1250 feet (334m) (8/17) = 1320 feet    Darren Mueller denies symptoms worrisome for COVID 19.   Past Medical History:  Diagnosis Date  .  Alcohol dependence (HCC) 03/22/2011   In remission   . Alcohol dependence in remission (HCC) 03/22/2011   In remission   . Alcoholic cirrhosis of liver with ascites (HCC) 2014  . Anginal pain (HCC)    with SOB admitted 04/20/2019  . Cardiomegaly 02/2019   Noted on CXR  . CHF (congestive heart failure) (HCC)   . CKD (chronic kidney disease), stage III (HCC) 07/18/2013  . COPD (chronic obstructive pulmonary disease) (HCC)   . Coronary artery disease   . Dermatitis 06/10/2015  . Diarrhea 09/04/2013  . Dyspnea    admitted 04/20/2019  . Elbow pain, left 03/17/2017  . Eustachian tube dysfunction   . GERD (gastroesophageal reflux disease)   . Gout   . Headache   . Hearing loss   . Heart murmur   . Hx of colonic polyps   . Hypertension   . Hypertriglyceridemia 03/17/2017  . Hypopotassemia   . Otalgia 11/28/2013  . Other chronic pulmonary heart diseases   . PAH (pulmonary artery hypertension) (HCC)    RV failure  . Pancytopenia (HCC)   . Pleural effusion 2014   Noted on CT:   Moderately large simple layering right pleural effusion   . Preventative health care 06/29/2016  . Pulmonary nodule 2014   Noted on CT: An 8 mm pulmonary nodule in the periphery of the left lower lobe  . Rectal bleeding   . Red eye 03/03/2016   right  . Seizures (HCC)   . Sleep apnea    does not wear CPAP  . Splenomegaly 12/2015   Noted on CT  . Thoracic aorta atherosclerosis (HCC) 12/2017  . Thrombocytopenia (HCC) 06/29/2016  . Unspecified hypothyroidism 01/25/2013  . Unspecified pleural effusion   . Vertigo   . Wears glasses    Past Surgical History:  Procedure Laterality Date  . CARDIAC CATHETERIZATION N/A 12/21/2015   Procedure: Right Heart Cath;  Surgeon: Dolores Patty, MD;  Location: [facility] INVASIVE CV LAB;  Service: Cardiovascular;  Laterality: N/A;  . CARDIAC CATHETERIZATION N/A 04/15/2016   Procedure: Right/Left Heart Cath and Coronary Angiography;  Surgeon: Dolores Patty, MD;  Location: [facility] INVASIVE  CV LAB;  Service: Cardiovascular;  Laterality: N/A;  . COLONOSCOPY N/A 12/08/2013   Procedure: COLONOSCOPY;  Surgeon: Rachael Fee, MD;  Location: [facility] ENDOSCOPY;  Service: Endoscopy;  Laterality: N/A;  . COLONOSCOPY WITH PROPOFOL N/A 03/24/2019   Procedure: COLONOSCOPY WITH PROPOFOL;  Surgeon: Rachael Fee, MD;  Location: [facility] ENDOSCOPY;  Service: Endoscopy;  Laterality: N/A;  Draw CBC in preop  . COLONOSCOPY WITH PROPOFOL N/A 04/28/2019   Procedure: COLONOSCOPY WITH PROPOFOL;  Surgeon: Rachael Fee, MD;  Location: [facility] ENDOSCOPY;  Service: Endoscopy;  Laterality: N/A;  . ESOPHAGOGASTRODUODENOSCOPY (EGD) WITH PROPOFOL N/A 03/27/2016   Procedure: ESOPHAGOGASTRODUODENOSCOPY (EGD) WITH PROPOFOL;  Surgeon: Rachael Fee, MD;  Location: [facility] ENDOSCOPY;  Service: Endoscopy;  Laterality: N/A;  . ESOPHAGOGASTRODUODENOSCOPY (EGD) WITH  PROPOFOL N/A 02/14/2019   Procedure: ESOPHAGOGASTRODUODENOSCOPY (EGD) WITH PROPOFOL;  Surgeon: Beverley Fiedler, MD;  Location[facility] ENDOSCOPY;  Service: Gastroenterology;  Laterality: N/A;  . EYE SURGERY Bilateral 03/15/2019   lasix  . HEMOSTASIS CLIP PLACEMENT  04/28/2019   Procedure: HEMOSTASIS CLIP PLACEMENT;  Surgeon: Rachael Fee, MD;  Location: [facility] ENDOSCOPY;  Service: Endoscopy;;  . LOOP RECORDER INSERTION N/A 01/01/2017   Procedure: Loop Recorder Insertion;  Surgeon: Hillis Range, MD;  Location: [facility] INVASIVE CV LAB;  Service: Cardiovascular;  Laterality: N/A;  . POLYPECTOMY  04/28/2019   Procedure: POLYPECTOMY;  Surgeon: Rachael Fee, MD;  Location: [facility] ENDOSCOPY;  Service: Endoscopy;;  . RIGHT HEART CATHETERIZATION Right 06/06/2014   Procedure: RIGHT HEART CATH;  Surgeon: Dolores Patty, MD;  Location: [facility] CATH LAB;  Service: Cardiovascular;  Laterality: Right;  . UVULOPALATOPHARYNGOPLASTY  1999     Current Outpatient Medications  Medication Sig Dispense Refill  . acetaminophen (TYLENOL) 500 MG tablet Take 1,000 mg by mouth every 6 (six) hours as needed (for  pain.).    Marland Kitchen albuterol (VENTOLIN HFA) 108 (90 Base) MCG/ACT inhaler Inhale 2 puffs into the lungs every 6 (six) hours as needed for wheezing or shortness of breath.    . allopurinol (ZYLOPRIM) 100 MG tablet Take 2 tablets (200 mg total) by mouth daily. 180 tablet 1  . Blood Pressure Monitoring (BLOOD PRESSURE CUFF) MISC Use as directed. Check blood pressure bid prn Dx:I10 1 each 0  . colchicine 0.6 MG tablet Take 0.6 mg by mouth daily as needed (gout flare).    . diphenoxylate-atropine (LOMOTIL) 2.5-0.025 MG tablet Take 1 tablet by mouth 3 (three) times daily. 60 tablet 0  . doxepin (SINEQUAN) 10 MG capsule Take 1-2 caps by mouth at bedtime as needed for sleep    . Ferrous Fumarate (HEMOCYTE) 324 (106 Fe) MG TABS tablet Take 1 tablet (106 mg of iron total) by mouth daily. 30 tablet 5  . fluticasone-salmeterol (ADVAIR HFA) 230-21 MCG/ACT inhaler Inhale 2 puffs into the lungs every morning. 48 g 1  . folic acid (FOLVITE) 1 MG tablet Take 1 tablet (1 mg total) by mouth daily. 30 tablet 5  . hyoscyamine (LEVSIN SL) 0.125 MG SL tablet Place 0.125 mg under the tongue every 6 (six) hours as needed.    . macitentan (OPSUMIT) 10 MG tablet Take 1 tablet (10 mg total) by mouth daily. 90 tablet 0  . Magnesium Oxide (MAG-OXIDE) 200 MG TABS Take 2 tablets (400 mg total) by mouth daily. 180 tablet 3  . metolazone (ZAROXOLYN) 2.5 MG tablet Take 0.5-1 tab by mouth as needed for edema and weight gain    . mometasone-formoterol (DULERA) 100-5 MCG/ACT AERO Inhale 2 puffs into the lungs 2 (two) times daily.    . Multiple Vitamin (MULTIVITAMIN) tablet Take 1 tablet by mouth daily.    Marland Kitchen omeprazole (PRILOSEC) 40 MG capsule Take 40 mg by mouth daily.    . potassium chloride (K-DUR) 10 MEQ tablet Take 6 tablets (60 mEq total) by mouth daily. 270 tablet 3  . rosuvastatin (CRESTOR) 5 MG tablet Take 1 tablet (5 mg total) by mouth daily. 90 tablet 3  . Selexipag (UPTRAVI) 1000 MCG TABS Take 1,000 mcg by mouth 2 (two) times  daily. 60 tablet 11  . tadalafil, PAH, (ADCIRCA) 20 MG tablet Take 2 tablets (40 mg total) by mouth daily. 90 tablet 1  . thiamine 100 MG tablet Take 1 tablet (100 mg total) by mouth daily. 30  tablet 5  . torsemide (DEMADEX) 20 MG tablet Take 1 tablet (20 mg total) by mouth every morning AND 2 tablets (40 mg total) every evening. 270 tablet 3   No current facility-administered medications for this encounter.     Allergies:   Aspirin and Nitroglycerin   Social History:  The patient  reports that he quit smoking about 39 years ago. His smoking use included cigarettes. He has a 10.00 pack-year smoking history. He has never used smokeless tobacco. He reports current alcohol use of about 6.0 standard drinks of alcohol per week. He reports that he does not use drugs.   Family History:  The patient's family history includes Alcoholism in his maternal grandfather; Cancer in his father; Gout in his brother; Heart attack in his mother; Heart disease in his mother; Hypertension in his brother, brother, and mother; Kidney failure in his mother; Liver disease in his maternal grandfather and mother; Migraines in his sister; Prostate cancer in his father.   ROS:  Please see the history of present illness.   All other systems are personally reviewed and negative.   Exam:  (Video/Tele Health Call; Exam is subjective and or/visual.) General:  Speaks in full sentences. No resp difficulty. Lungs: Normal respiratory effort with conversation.  Abdomen: mildly distended per patient report Extremities: Pt endorses moderate edema. Neuro: Alert & oriented x 3.   Recent Labs: 05/16/2019: B Natriuretic Peptide 1,119.2; Pro B Natriuretic peptide (BNP) 1,293.0 06/02/2019: TSH 2.50 07/12/2019: ALT 18; Hemoglobin 10.7; Platelets 31.0 Repeated and verified X2. 07/20/2019: BUN 44; Creatinine, Ser 2.34; Magnesium 1.7; Potassium 3.0; Sodium 138  Personally reviewed   Wt Readings from Last 3 Encounters:  07/12/19 95.8 kg (211  lb 3.2 oz)  06/29/19 93.9 kg (207 lb 1.6 oz)  06/09/19 96.3 kg (212 lb 6.4 oz)      ASSESSMENT AND PLAN:  1. PAH: Suspected portopulmonary HTN in setting of ETOH cirrhosis. - Slightly worse NYHA III in setting of recurrent volume overload - 9/19 1280 feet (390 meters) - Echo 9/19 with EF 60-65% RV markedly dilated and hypokinetic with D-shaped septum - Echo  05/17/19: LVEF > 65% RV severely dilated with moderate RV dysfunction. Septal flattening Moderate TR RVSP . - Continue selexipag 1000 mcg twice a day. Intolerant higher dose due to diarrhea and headaches .  - Continue macitentan 10 daily and adcirca 40 mg daily. - With RV strain on echo may need to consider repeat RHC and  IV therapies in the near future.    2. Diastolic HF - Volume status worse again in setting of missing torsemide due to his mother's death  - Continue torsemide 40 bid - Add metolazone 2.5 daily for 2-3 days with 40 kcl with each dose  - I think baseline weight really closer to 190-195 - check labs next Monday - f/u televist in 2 weeks   3. CKD III:  -Baseline creatinine 1.7-2.0. Was 2.3 on 07/20/19. Recehck next [place] - Followed by Dr. Arrie Aran   4. Cirrhosis:  - Likely combination of RV failure and ETOH - Follows with Dr. Christella Hartigan. - Did not tolerate nadolol.  - EGD 4/20 stable - Stressed need for ETOH cessation  5. Recurrent syncope/seizure - LINQ interrogations have shown no arrhythmias and thus most likely seizure - Continue Keppra. Follows with Dr. Karel Jarvis  6. HTN:  -.Blood pressure well controlled. Continue current regimen.  7. OSA:  - Has mild OSA with AHI 12 and desats down to 82%.  - Intolerant CPAP.  8. Pancytopenia:  - Has seen Dr. Clelia Croft in Hematology - felt like it is due to splenic sequestration.  - No role for BMBx current per Dr. Clelia Croft    9. Hypokalemia - recent potassium 3.0 on 07/20/19 - taking 60 daily kcl.  - Bmet on Monday  10. Hypomag - continue  magOx 400 daily  COVID screen The patient does not have any symptoms that suggest any further testing/ screening at this time. Social distancing reinforced today.  Patient Risk: After full review of this patients clinical status, I feel that they are at moderate risk for cardiac decompensation at this time.  Relevant cardiac medications were reviewed at length with the patient today. The patient does not have concerns regarding their medications at this time.   Recommended follow-up:2-3 weeks televisit   Today, I have spent16 minutes with the patient with telehealth technology discussing the above issues .   Signed, [name], MD  08/04/2019 1:48 PM  [facility] [address] Heart and Vascular [place] [address] [phone] (office) [phone] (fax)

## 2019-08-08 ENCOUNTER — Ambulatory Visit (HOSPITAL_COMMUNITY)
Admission: RE | Admit: 2019-08-08 | Discharge: 2019-08-08 | Disposition: A | Discharge status: Home / Self Care | Payer: Medicare Other | Source: Ambulatory Visit | Attending: Cardiology | Admitting: Cardiology

## 2019-08-08 ENCOUNTER — Other Ambulatory Visit: Payer: Self-pay

## 2019-08-08 DIAGNOSIS — I5032 Chronic diastolic (congestive) heart failure: Secondary | ICD-10-CM | POA: Insufficient documentation

## 2019-08-08 LAB — BASIC METABOLIC PANEL
Anion gap: 10 (ref 5–15)
BUN: 37 mg/dL — ABNORMAL HIGH (ref 8–23)
CO2: 21 mmol/L — ABNORMAL LOW (ref 22–32)
Calcium: 8.3 mg/dL — ABNORMAL LOW (ref 8.9–10.3)
Chloride: 108 mmol/L (ref 98–111)
Creatinine, Ser: 1.86 mg/dL — ABNORMAL HIGH (ref 0.61–1.24)
GFR calc Af Amer: 42 mL/min — ABNORMAL LOW (ref >60)
GFR calc non Af Amer: 36 mL/min — ABNORMAL LOW (ref >60)
Glucose, Bld: 104 mg/dL — ABNORMAL HIGH (ref 70–99)
Potassium: 3.5 mmol/L (ref 3.5–5.1)
Sodium: 139 mmol/L (ref 135–145)

## 2019-08-09 ENCOUNTER — Encounter (HOSPITAL_COMMUNITY): Payer: Medicare Other | Admitting: Internal Medicine

## 2019-08-18 MED FILL — DOXEPIN 10 MG CAPSULE: 10 | 30 days supply | Qty: 60 | Fill #1

## 2019-08-18 MED FILL — OMEPRAZOLE 40 MG CPDR: 40 | 90 days supply | Qty: 90 | Fill #0

## 2019-08-19 ENCOUNTER — Other Ambulatory Visit (HOSPITAL_COMMUNITY): Payer: Self-pay

## 2019-08-19 ENCOUNTER — Other Ambulatory Visit: Payer: Self-pay

## 2019-08-19 ENCOUNTER — Ambulatory Visit (HOSPITAL_COMMUNITY)
Admission: RE | Admit: 2019-08-19 | Discharge: 2019-08-19 | Disposition: A | Discharge status: Home / Self Care | Payer: Medicare Other | Source: Ambulatory Visit | Attending: Internal Medicine | Admitting: Internal Medicine

## 2019-08-19 DIAGNOSIS — I2721 Secondary pulmonary arterial hypertension: Secondary | ICD-10-CM

## 2019-08-19 DIAGNOSIS — N183 Chronic kidney disease, stage 3 unspecified: Secondary | ICD-10-CM

## 2019-08-19 DIAGNOSIS — I272 Pulmonary hypertension, unspecified: Secondary | ICD-10-CM

## 2019-08-19 DIAGNOSIS — I5032 Chronic diastolic (congestive) heart failure: Secondary | ICD-10-CM

## 2019-08-19 DIAGNOSIS — E876 Hypokalemia: Secondary | ICD-10-CM

## 2019-08-19 DIAGNOSIS — K766 Portal hypertension: Secondary | ICD-10-CM

## 2019-08-19 MED ORDER — OPSUMIT 10 MG PO TABS: 10.0000 mg | ORAL_TABLET | Freq: Every day | ORAL | 0 refills | Status: DC

## 2019-08-19 NOTE — Progress Notes (Signed)
Heart Failure TeleHealth Note  Due to national recommendations of social distancing due to [place] 19, Audio/video telehealth visit is felt to be most appropriate for this patient at this time.  See MyChart message from today for patient consent regarding telehealth for [facility].  Date:  08/19/2019   ID:  Darren Mueller, DOB Aug 15, 1951, MRN [id number]  Location: Home  Provider location: [facility] Type of Visit: Established patient  PCP:  Mosie Lukes, MD  Cardiologist:  Glori Bickers, MD Primary HF: Bensimhon  Chief Complaint: Heart Failure follow-up   History of Present Illness:  [name] ("[name]") is a 68yo with history of COPD, portopulmonary hypertension with RV failure, and ETOH cirrhosis.  Admitted in 6/17 with CP and SOB. Underwent R/L cath. Minimal CAD with moderate PAH and preserved cardiac output.   Admitted 06/2016 and 07/2016 with syncope. On September admission found to have elevated ETOH level as well as R 6th rib fracture. Wore 30 day monitor up until 08/28/16. No AF noted. No events.   Admitted February2018after syncopal event. LINQ placed.   Was admitted 4/20 with hematochezia and melena. His hemoglobin remained stable. No episodes of GI bleed after hospitalization. Underwent EGD by GI with finding of Grade 1 and a small esophageal varices, no evidence of recent bleeding,portal hypertensive gastropathy. Started on nadolol which made him very dizzy. He stopped this and feels a lot   Admitted  7/13-16/20 with volume overload. Diuresed 21 pounds. D/c weight 192.  Echo  05/17/19: LVEF > 65% RV severely dilated with moderate RV dysfunction. Moderate TR RVSP 54mHG. While in hospital was pancytopenic. Possibility of BMBx discussed with Dr. SAlen Blewbut felt it was not necessary as it was felt he had splenic sequestration and counts were low but very stable.  He presents via aEngineer, civil (consulting)for a  telehealth visit today. We had a call with him last week and volume was up in the setting of missing diuretic due to his mother's death. We gave him several doses of metolazone. Weight up further from 210 -> 217 (baseline 203). Says he stopped torsemide and has only been taking metolazone 2.5 bid.    Recently struggling with volume overload. Took metolazone. Weight 211-> 203. Weight back up to 210. Says he missed some doses with his mother's passing. + worsening edema and DOE.  No problem with ADLs. No orthopnea or PND. No bleeding. Now on torsemide 40 bid.     PAH meds 1) Macitnentan 10 mg daily 2) Adcirca 40 mg daily  3) Selexipeg 1000/1200 daily   Studies: ECHO 12/14 EF 55-60% Peak PA pressure 35. Severe RV dysfunction  ECHO 7/15 EF 60% RV moderately to severely dilated. Moderate HK RVSP 574mHG  ECHO 6/16 EF 60% RV moderately to severely dilated. Severe HK RVSP ~65 mm HG. D-shaped septum Echo 2/17 LVEF 60-65% RV massively dilated. Flat septum. Severe HK. Moderate TR RVSP ~65. IVC small. No effusion  ECHO 01/01/2017 EF 50-55% Grade I DD. RV severely dilated. Peak PA pressure 62 mm hg Echo 9/19 with EF 60-65% RV markedly dilated and hypokinetic with D-shaped septum. RVSP 6817m  Cath 6/17 Mid RCA lesion, 20% stenosed. Dist LAD lesion, 20% stenosed. Ao = 107/70 (87) LV = 106/3/11 RA = 4 RV = 74/11 PA = 74/26 (45) PCW = 6 Fick cardiac output/index = 4.6.2.2 PVR = 8.5 Ao sat = 95% PA sat = 61%, 58%  RHC 2/17 RA = 11 RV =  80/16/19 PA = 83/61 (71) PCW = 9 Fick cardiac output/index = 5.6/2.6 PVR = 10.3 WU Ao sat = 94% PA sat = 65%, 68% High SVC sat = 65%   PFTs 3/14 FEV1 2.02 L (58%) FVC 2.7L (54%) FEV1/FVC (89%) DLCO 53%  PFTs 1/17 FEV1 2.05 L (61%) FVC 2.48L (56%) DLCO 44%   Ab u/s 8/16: + cirrhosis/mild to moderate splenomegaly  6 min walk 01/10/13, 1290 feet  6MW (01/10/14) = 1280 feet (380 m) 6MW (9/15) = 1350 feet (411 m) O2 sats ranged from  89-95% on room air, HR ranged from 100-129. 6MW (6/16) = 384 meters  6MW (2/17) = 1250 feet (378m 6MW (8/17) = 1320 feet    CLanier Ensigndenies symptoms worrisome for COVID 19.   Past Medical History:  Diagnosis Date  . Alcohol dependence ([place] 03/22/2011   In remission   . Alcohol dependence in remission ([facility] 03/22/2011   In remission   . Alcoholic cirrhosis of liver with ascites ([place] 2014  . Anginal pain ([place]    with SOB admitted 04/20/2019  . Cardiomegaly 02/2019   Noted on CXR  . CHF (congestive heart failure) ([place]   . CKD (chronic kidney disease), stage III ([place] 07/18/2013  . COPD (chronic obstructive pulmonary disease) ([facility]   . Coronary artery disease   . Dermatitis 06/10/2015  . Diarrhea 09/04/2013  . Dyspnea    admitted 04/20/2019  . Elbow pain, left 03/17/2017  . Eustachian tube dysfunction   . GERD (gastroesophageal reflux disease)   . Gout   . Headache   . Hearing loss   . Heart murmur   . Hx of colonic polyps   . Hypertension   . Hypertriglyceridemia 03/17/2017  . Hypopotassemia   . Otalgia 11/28/2013  . Other chronic pulmonary heart diseases   . PAH (pulmonary artery hypertension) (HCC)    RV failure  . Pancytopenia ([facility]   . Pleural effusion 2014   Noted on CT:   Moderately large simple layering right pleural effusion   . Preventative health care 06/29/2016  . Pulmonary nodule 2014   Noted on CT: An 8 mm pulmonary nodule in the periphery of the left lower lobe  . Rectal bleeding   . Red eye 03/03/2016   right  . Seizures ([facility] [place]   . Sleep apnea    does not wear CPAP  . Splenomegaly 12/2015   Noted on CT  . Thoracic aorta atherosclerosis ([place] 12/2017  . Thrombocytopenia ([facility] 06/29/2016  . Unspecified hypothyroidism 01/25/2013  . Unspecified pleural effusion   . Vertigo   . Wears glasses    Past Surgical History:  Procedure Laterality Date  . CARDIAC CATHETERIZATION N/A 12/21/2015   Procedure: Right Heart Cath;  Surgeon: DJolaine Artist  MD;  [facility];  Service: Cardiovascular;  Laterality: N/A;  . CARDIAC CATHETERIZATION N/A 04/15/2016   Procedure: Right/Left Heart Cath and Coronary Angiography;  Surgeon: DJolaine Artist MD;  [facility];  Service: Cardiovascular;  Laterality: N/A;  . COLONOSCOPY N/A 12/08/2013   Procedure: COLONOSCOPY;  Surgeon: DMilus Banister MD;  [facility];  Service: Endoscopy;  Laterality: N/A;  . COLONOSCOPY WITH PROPOFOL N/A 03/24/2019   Procedure: COLONOSCOPY WITH PROPOFOL;  Surgeon: JMilus Banister MD;  Location: [facility];  Service: Endoscopy;  Laterality: N/A;  Draw CBC in preop  . COLONOSCOPY WITH PROPOFOL N/A 04/28/2019   Procedure: COLONOSCOPY WITH PROPOFOL;  Surgeon: JMilus Banister MD;  Location: [facility];  Service: Endoscopy;  Laterality: N/A;  . ESOPHAGOGASTRODUODENOSCOPY (EGD) WITH PROPOFOL N/A 03/27/2016   Procedure: ESOPHAGOGASTRODUODENOSCOPY (EGD) WITH PROPOFOL;  Surgeon: Milus Banister, MD;  Location: [facility];  Service: Endoscopy;  Laterality: N/A;  . ESOPHAGOGASTRODUODENOSCOPY (EGD) WITH PROPOFOL N/A 02/14/2019   Procedure: ESOPHAGOGASTRODUODENOSCOPY (EGD) WITH PROPOFOL;  Surgeon: Jerene Bears, MD;  [facility]OSCOPY;  Service: Gastroenterology;  Laterality: N/A;  . EYE SURGERY Bilateral 03/15/2019   lasix  . HEMOSTASIS CLIP PLACEMENT  04/28/2019   Procedure: HEMOSTASIS CLIP PLACEMENT;  Surgeon: Milus Banister, MD;  Location: [facility];  Service: Endoscopy;;  . LOOP RECORDER INSERTION N/A 01/01/2017   Procedure: Loop Recorder Insertion;  Surgeon: Thompson Grayer, MD;  Location: [facility];  Service: Cardiovascular;  Laterality: N/A;  . POLYPECTOMY  04/28/2019   Procedure: POLYPECTOMY;  Surgeon: Milus Banister, MD;  Location: [facility];  Service: Endoscopy;;  . RIGHT HEART CATHETERIZATION Right 06/06/2014   Procedure: RIGHT HEART CATH;  Surgeon: Jolaine Artist, MD;  Location: [facility] CATH LAB;  Service:  Cardiovascular;  Laterality: Right;  . UVULOPALATOPHARYNGOPLASTY  1999     Current Outpatient Medications  Medication Sig Dispense Refill  . acetaminophen (TYLENOL) 500 MG tablet Take 1,000 mg by mouth every 6 (six) hours as needed (for pain.).    Marland Kitchen albuterol (VENTOLIN HFA) 108 (90 Base) MCG/ACT inhaler Inhale 2 puffs into the lungs every 6 (six) hours as needed for wheezing or shortness of breath.    . allopurinol (ZYLOPRIM) 100 MG tablet Take 2 tablets (200 mg total) by mouth daily. 180 tablet 1  . Blood Pressure Monitoring (BLOOD PRESSURE CUFF) MISC Use as directed. Check blood pressure bid prn Dx:I10 1 each 0  . colchicine 0.6 MG tablet Take 0.6 mg by mouth daily as needed (gout flare).    . diphenoxylate-atropine (LOMOTIL) 2.5-0.025 MG tablet Take 1 tablet by mouth 3 (three) times daily. 60 tablet 0  . doxepin (SINEQUAN) 10 MG capsule Take 1-2 caps by mouth at bedtime as needed for sleep    . Ferrous Fumarate (HEMOCYTE) 324 (106 Fe) MG TABS tablet Take 1 tablet (106 mg of iron total) by mouth daily. 30 tablet 5  . fluticasone-salmeterol (ADVAIR HFA) 230-21 MCG/ACT inhaler Inhale 2 puffs into the lungs every morning. 48 g 1  . folic acid (FOLVITE) 1 MG tablet Take 1 tablet (1 mg total) by mouth daily. 30 tablet 5  . hyoscyamine (LEVSIN SL) 0.125 MG SL tablet Place 0.125 mg under the tongue every 6 (six) hours as needed.    . macitentan (OPSUMIT) 10 MG tablet Take 1 tablet (10 mg total) by mouth daily. 90 tablet 0  . Magnesium Oxide (MAG-OXIDE) 200 MG TABS Take 2 tablets (400 mg total) by mouth daily. 180 tablet 3  . metolazone (ZAROXOLYN) 2.5 MG tablet Take 1 tablet (2.5 mg total) by mouth as directed. As directed by heart failure team 30 tablet 0  . mometasone-formoterol (DULERA) 100-5 MCG/ACT AERO Inhale 2 puffs into the lungs 2 (two) times daily.    . Multiple Vitamin (MULTIVITAMIN) tablet Take 1 tablet by mouth daily.    Marland Kitchen omeprazole (PRILOSEC) 40 MG capsule Take 40 mg by mouth daily.     . potassium chloride (K-DUR) 10 MEQ tablet Take 6 tablets (60 mEq total) by mouth daily. 270 tablet 3  . rosuvastatin (CRESTOR) 5 MG tablet Take 1 tablet (5 mg total) by mouth daily. 90 tablet 3  . Selexipag (UPTRAVI) 1000  MCG TABS Take 1,000 mcg by mouth 2 (two) times daily. 60 tablet 11  . tadalafil, PAH, (ADCIRCA) 20 MG tablet Take 2 tablets (40 mg total) by mouth daily. 90 tablet 1  . thiamine 100 MG tablet Take 1 tablet (100 mg total) by mouth daily. 30 tablet 5  . torsemide (DEMADEX) 20 MG tablet Take 1 tablet (20 mg total) by mouth every morning AND 2 tablets (40 mg total) every evening. 270 tablet 3   No current facility-administered medications for this encounter.     Allergies:   Aspirin and Nitroglycerin   Social History:  The patient  reports that he quit smoking about 39 years ago. His smoking use included cigarettes. He has a 10.00 pack-year smoking history. He has never used smokeless tobacco. He reports current alcohol use of about 6.0 standard drinks of alcohol per week. He reports that he does not use drugs.   Family History:  The patient's family history includes Alcoholism in his maternal grandfather; Cancer in his father; Gout in his brother; Heart attack in his mother; Heart disease in his mother; Hypertension in his brother, brother, and mother; Kidney failure in his mother; Liver disease in his maternal grandfather and mother; Migraines in his sister; Prostate cancer in his father.   ROS:  Please see the history of present illness.   All other systems are personally reviewed and negative.   Exam:  (Video/Tele Health Call; Exam is subjective and or/visual.) General:  Speaks in full sentences. No resp difficulty. Lungs: Normal respiratory effort with conversation.  Abdomen: distended per patient report Extremities: Pt endorses significant edema. Neuro: Alert & oriented x 3.   Recent Labs: 05/16/2019: B Natriuretic Peptide 1,119.2; Pro B Natriuretic peptide (BNP)  1,293.0 06/02/2019: TSH 2.50 07/12/2019: ALT 18; Hemoglobin 10.7; Platelets 31.0 Repeated and verified X2. 07/20/2019: Magnesium 1.7 08/08/2019: BUN 37; Creatinine, Ser 1.86; Potassium 3.5; Sodium 139  Personally reviewed   Wt Readings from Last 3 Encounters:  07/12/19 95.8 kg (211 lb 3.2 oz)  06/29/19 93.9 kg (207 lb 1.6 oz)  06/09/19 96.3 kg (212 lb 6.4 oz)      ASSESSMENT AND PLAN:  1. PAH: Suspected portopulmonary HTN in setting of ETOH cirrhosis. - Slightly worse NYHA III in setting of recurrent volume overload -6MW 9/19 1280 feet (390 meters) - Echo 9/19 with EF 60-65% RV markedly dilated and hypokinetic with D-shaped septum - Echo  05/17/19: LVEF > 65% RV severely dilated with moderate RV dysfunction. Septal flattening Moderate TR RVSP 34mHG. - Continue selexipag 1000 mcg twice a day. Intolerant higher dose due to diarrhea and headaches .  - Continue macitentan 10 daily and adcirca 40 mg daily. - With RV strain on echo may need to consider repeat RHC and  IV therapies in the near future.    2. Diastolic HF - Volume status worse again in setting of stopping torsemide when we started metolazone - Resume torsemide at 60 bid (was at 40 bid) - Continue metolazone 2.5 daily for 2-3 days with 40 kcl with each dose  - I think baseline weight really closer to 190-195 - check labs  Monday - f/u televist next week  3. CKD III:  -Baseline creatinine 1.7-2.0. Was 2.3 on 07/20/19. - Followed by Dr. CMarval Regal - Recheck Monday  4. Cirrhosis:  - Likely combination of RV failure and ETOH - Follows with Dr. JArdis Hughs - Did not tolerate nadolol.  - EGD 4/20 stable - Stressed need for ETOH cessation  5. Recurrent  syncope/seizure - LINQ interrogations have shown no arrhythmias and thus most likely seizure - Continue Keppra. Follows with Dr. Delice Lesch  6. HTN:  -.Blood pressure well controlled. Continue current regimen.  7. OSA:  - Has mild OSA with AHI 12 and desats down to  82%.  - Intolerant CPAP.   8. Pancytopenia:  - Has seen Dr. Alen Blew in Hematology - felt like it is due to splenic sequestration.  - No role for BMBx current per Dr. Alen Blew    9. Hypokalemia - recent potassium 3.0 on 07/20/19 - taking 60 daily kcl.  - Bmet on Monday  10. Hypomag - continue magOx 400 daily  COVID screen The patient does not have any symptoms that suggest any further testing/ screening at this time. Social distancing reinforced today.  Patient Risk: After full review of this patients clinical status, I feel that they are at moderate risk for cardiac decompensation at this time.  Relevant cardiac medications were reviewed at length with the patient today. The patient does not have concerns regarding their medications at this time.   Recommended follow-up:1 week televisit   Today, I have spent14 minutes with the patient with telehealth technology discussing the above issues .   Signed, Glori Bickers, MD  08/19/2019 4:06 PM  [facility] [place] [address] [facility] [place] [address] [phone] (office) [phone] (fax)

## 2019-08-23 ENCOUNTER — Ambulatory Visit (INDEPENDENT_AMBULATORY_CARE_PROVIDER_SITE_OTHER): Payer: Medicare Other | Admitting: Family Medicine

## 2019-08-23 ENCOUNTER — Ambulatory Visit (HOSPITAL_BASED_OUTPATIENT_CLINIC_OR_DEPARTMENT_OTHER)
Admission: RE | Admit: 2019-08-23 | Discharge: 2019-08-23 | Disposition: A | Discharge status: Home / Self Care | Payer: Medicare Other | Source: Ambulatory Visit | Attending: Family Medicine | Admitting: Family Medicine

## 2019-08-23 ENCOUNTER — Other Ambulatory Visit: Payer: Self-pay

## 2019-08-23 VITALS — BP 110/62 | HR 85 | Temp 97.1°F | Resp 18 | Wt 208.4 lb

## 2019-08-23 DIAGNOSIS — R109 Unspecified abdominal pain: Secondary | ICD-10-CM | POA: Insufficient documentation

## 2019-08-23 DIAGNOSIS — F1029 Alcohol dependence with unspecified alcohol-induced disorder: Secondary | ICD-10-CM

## 2019-08-23 DIAGNOSIS — K5900 Constipation, unspecified: Secondary | ICD-10-CM

## 2019-08-23 DIAGNOSIS — I10 Essential (primary) hypertension: Secondary | ICD-10-CM

## 2019-08-23 DIAGNOSIS — N183 Chronic kidney disease, stage 3 unspecified: Secondary | ICD-10-CM | POA: Diagnosis not present

## 2019-08-23 DIAGNOSIS — E785 Hyperlipidemia, unspecified: Secondary | ICD-10-CM

## 2019-08-23 DIAGNOSIS — R600 Localized edema: Secondary | ICD-10-CM

## 2019-08-23 DIAGNOSIS — R739 Hyperglycemia, unspecified: Secondary | ICD-10-CM

## 2019-08-23 DIAGNOSIS — F4321 Adjustment disorder with depressed mood: Secondary | ICD-10-CM

## 2019-08-23 MED ORDER — FOLIC ACID 1 MG PO TABS: 1.0000 mg | ORAL_TABLET | Freq: Every day | ORAL | 5 refills | Status: DC

## 2019-08-23 MED ORDER — HYOSCYAMINE SULFATE 0.125 MG SL SUBL: 0.1250 mg | SUBLINGUAL_TABLET | Freq: Four times a day (QID) | SUBLINGUAL | 1 refills | Status: DC | PRN

## 2019-08-23 MED ORDER — DOXEPIN HCL 25 MG PO CAPS: 25.0000 mg | ORAL_CAPSULE | Freq: Every evening | ORAL | 1 refills | Status: DC | PRN

## 2019-08-23 MED FILL — OSCIMIN SL 0.125 MG TABLET: 0.125 | 8 days supply | Qty: 30 | Fill #0

## 2019-08-23 MED FILL — FOLIC ACID 1 MG TABS: 1 | 30 days supply | Qty: 30 | Fill #0

## 2019-08-23 MED FILL — DOXEPIN 25 MG CAPSULE: 25 | 30 days supply | Qty: 60 | Fill #0

## 2019-08-23 NOTE — Progress Notes (Signed)
Subjective:    Patient ID: Darren Mueller, male    DOB: July 07, 1951, 68 y.o.   MRN: [id number]  No chief complaint on file.   HPI Patient is in today for follow up on chronic medical concerns including hypertension, hyperglycemia, alcohol use and more. No recent febrile illness or hospitalizations. He just lost his mother in past couple of weeks and lost his Dad this year as well. He is having a hard time with sleep mostly. He has good family support. He notes the Doxepin at 20 mg was helping before he lost his mom. He has noted his diarrhea has resolved and now he is noting constipation with hard small stool requiring straining daily. Has some abdominal cramping daily sharp and it starts low and spreads. No nausea, vomiting, anorexia, bloody or tarry stool. He continues to struggle with fatigue and edema but no other new or acute concerns. Denies CP/palp/HA/congestion/fevers or GU c/o. Taking meds as prescribed  Past Medical History:  Diagnosis Date  . Alcohol dependence ([place]) 03/22/2011   In remission   . Alcohol dependence in remission ([place]) 03/22/2011   In remission   . Alcoholic cirrhosis of liver with ascites ([place]) 2014  . Anginal pain ([place])    with SOB admitted 04/20/2019  . Cardiomegaly 02/2019   Noted on CXR  . CHF (congestive heart failure) ([place])   . CKD (chronic kidney disease), stage III ([place]) 07/18/2013  . COPD (chronic obstructive pulmonary disease) ([place])   . Coronary artery disease   . Dermatitis 06/10/2015  . Diarrhea 09/04/2013  . Dyspnea    admitted 04/20/2019  . Elbow pain, left 03/17/2017  . Eustachian tube dysfunction   . GERD (gastroesophageal reflux disease)   . Gout   . Headache   . Hearing loss   . Heart murmur   . Hx of colonic polyps   . Hypertension   . Hypertriglyceridemia 03/17/2017  . Hypopotassemia   . Otalgia 11/28/2013  . Other chronic pulmonary heart diseases   . PAH (pulmonary artery hypertension) ([facility])    RV failure  . Pancytopenia ([place])   .  Pleural effusion 2014   Noted on CT:   Moderately large simple layering right pleural effusion   . Preventative health care 06/29/2016  . Pulmonary nodule 2014   Noted on CT: An 8 mm pulmonary nodule in the periphery of the left lower lobe  . Rectal bleeding   . Red eye 03/03/2016   right  . Seizures ([place])   . Sleep apnea    does not wear CPAP  . Splenomegaly 12/2015   Noted on CT  . Thoracic aorta atherosclerosis ([place]) 12/2017  . Thrombocytopenia ([place]) 06/29/2016  . Unspecified hypothyroidism 01/25/2013  . Unspecified pleural effusion   . Vertigo   . Wears glasses     Past Surgical History:  Procedure Laterality Date  . CARDIAC CATHETERIZATION N/A 12/21/2015   Procedure: Right Heart Cath;  Surgeon: Jolaine Artist, MD;  Location: [facility];  Service: Cardiovascular;  Laterality: N/A;  . CARDIAC CATHETERIZATION N/A 04/15/2016   Procedure: Right/Left Heart Cath and Coronary Angiography;  Surgeon: Jolaine Artist, MD;  Location: [facility];  Service: Cardiovascular;  Laterality: N/A;  . COLONOSCOPY N/A 12/08/2013   Procedure: COLONOSCOPY;  Surgeon: Milus Banister, MD;  Location: [facility];  Service: Endoscopy;  Laterality: N/A;  . COLONOSCOPY WITH PROPOFOL N/A 03/24/2019   Procedure: COLONOSCOPY WITH PROPOFOL;  Surgeon: Milus Banister, MD;  Location:  [facility];  Service: Endoscopy;  Laterality: N/A;  Draw CBC in preop  . COLONOSCOPY WITH PROPOFOL N/A 04/28/2019   Procedure: COLONOSCOPY WITH PROPOFOL;  Surgeon: Milus Banister, MD;  Location: [facility];  Service: Endoscopy;  Laterality: N/A;  . ESOPHAGOGASTRODUODENOSCOPY (EGD) WITH PROPOFOL N/A 03/27/2016   Procedure: ESOPHAGOGASTRODUODENOSCOPY (EGD) WITH PROPOFOL;  Surgeon: Milus Banister, MD;  Location: [facility];  Service: Endoscopy;  Laterality: N/A;  . ESOPHAGOGASTRODUODENOSCOPY (EGD) WITH PROPOFOL N/A 02/14/2019   Procedure: ESOPHAGOGASTRODUODENOSCOPY (EGD) WITH PROPOFOL;  Surgeon: Jerene Bears, MD;   Location: [facility];  Service: Gastroenterology;  Laterality: N/A;  . EYE SURGERY Bilateral 03/15/2019   lasix  . HEMOSTASIS CLIP PLACEMENT  04/28/2019   Procedure: HEMOSTASIS CLIP PLACEMENT;  Surgeon: Milus Banister, MD;  Location: [facility];  Service: Endoscopy;;  . LOOP RECORDER INSERTION N/A 01/01/2017   Procedure: Loop Recorder Insertion;  Surgeon: Thompson Grayer, MD;  Location: [facility];  Service: Cardiovascular;  Laterality: N/A;  . POLYPECTOMY  04/28/2019   Procedure: POLYPECTOMY;  Surgeon: Milus Banister, MD;  Location: [facility];  Service: Endoscopy;;  . RIGHT HEART CATHETERIZATION Right 06/06/2014   Procedure: RIGHT HEART CATH;  Surgeon: Jolaine Artist, MD;  [facility] LAB;  Service: Cardiovascular;  Laterality: Right;  . UVULOPALATOPHARYNGOPLASTY  1999    Family History  Problem Relation Age of Onset  . Heart disease Mother   . Heart attack Mother   . Hypertension Mother   . Kidney failure Mother   . Liver disease Mother        stage 4 liver disease  . Prostate cancer Father   . Cancer Father        lung  . Hypertension Brother   . Gout Brother   . Alcoholism Maternal Grandfather   . Liver disease Maternal Grandfather   . Migraines Sister   . Hypertension Brother   . Colon cancer Neg Hx     Social History   Socioeconomic History  . Marital status: Divorced    Spouse name: Not on file  . Number of children: 3  . Years of education: Not on file  . Highest education level: Not on file  Occupational History  . Occupation: Retired    Comment: Microbiologist  . Financial resource strain: Not hard at all  . Food insecurity    Worry: Never true    Inability: Never true  . Transportation needs    Medical: No    Non-medical: No  Tobacco Use  . Smoking status: Former Smoker    Packs/day: 2.00    Years: 5.00    Pack years: 10.00    Types: Cigarettes    Quit date: 09/22/1979    Years since quitting: 39.9  . Smokeless  tobacco: Never Used  Substance and Sexual Activity  . Alcohol use: Yes    Alcohol/week: 6.0 standard drinks    Types: 6 Shots of liquor per week    Comment: social drinker  . Drug use: No  . Sexual activity: Yes    Birth control/protection: Spermicide  Lifestyle  . Physical activity    Days per week: 0 days    Minutes per session: 0 min  . Stress: Rather much  Relationships  . Social connections    Talks on phone: More than three times a week    Gets together: Three times a week    Attends religious service: More than 4 times per year  Active member of club or organization: Yes    Attends meetings of clubs or organizations: More than 4 times per year    Relationship status: Divorced  . Intimate partner violence    Fear of current or ex partner: Not on file    Emotionally abused: Not on file    Physically abused: Not on file    Forced sexual activity: Not on file  Other Topics Concern  . Not on file  Social History Narrative   0 caffeine drinks daily     Outpatient Medications Prior to Visit  Medication Sig Dispense Refill  . acetaminophen (TYLENOL) 500 MG tablet Take 1,000 mg by mouth every 6 (six) hours as needed (for pain.).    Marland Kitchen albuterol (VENTOLIN HFA) 108 (90 Base) MCG/ACT inhaler Inhale 2 puffs into the lungs every 6 (six) hours as needed for wheezing or shortness of breath.    . allopurinol (ZYLOPRIM) 100 MG tablet Take 2 tablets (200 mg total) by mouth daily. 180 tablet 1  . Blood Pressure Monitoring (BLOOD PRESSURE CUFF) MISC Use as directed. Check blood pressure bid prn Dx:I10 1 each 0  . colchicine 0.6 MG tablet Take 0.6 mg by mouth daily as needed (gout flare).    . diphenoxylate-atropine (LOMOTIL) 2.5-0.025 MG tablet Take 1 tablet by mouth 3 (three) times daily. 60 tablet 0  . Ferrous Fumarate (HEMOCYTE) 324 (106 Fe) MG TABS tablet Take 1 tablet (106 mg of iron total) by mouth daily. 30 tablet 5  . fluticasone-salmeterol (ADVAIR HFA) 230-21 MCG/ACT inhaler  Inhale 2 puffs into the lungs every morning. 48 g 1  . macitentan (OPSUMIT) 10 MG tablet Take 1 tablet (10 mg total) by mouth daily. 90 tablet 0  . Magnesium Oxide (MAG-OXIDE) 200 MG TABS Take 2 tablets (400 mg total) by mouth daily. 180 tablet 3  . metolazone (ZAROXOLYN) 2.5 MG tablet Take 1 tablet (2.5 mg total) by mouth as directed. As directed by heart failure team 30 tablet 0  . mometasone-formoterol (DULERA) 100-5 MCG/ACT AERO Inhale 2 puffs into the lungs 2 (two) times daily.    . Multiple Vitamin (MULTIVITAMIN) tablet Take 1 tablet by mouth daily.    Marland Kitchen omeprazole (PRILOSEC) 40 MG capsule Take 40 mg by mouth daily.    . potassium chloride (K-DUR) 10 MEQ tablet Take 6 tablets (60 mEq total) by mouth daily. 270 tablet 3  . rosuvastatin (CRESTOR) 5 MG tablet Take 1 tablet (5 mg total) by mouth daily. 90 tablet 3  . Selexipag (UPTRAVI) 1000 MCG TABS Take 1,000 mcg by mouth 2 (two) times daily. 60 tablet 11  . tadalafil, PAH, (ADCIRCA) 20 MG tablet Take 2 tablets (40 mg total) by mouth daily. 90 tablet 1  . thiamine 100 MG tablet Take 1 tablet (100 mg total) by mouth daily. 30 tablet 5  . torsemide (DEMADEX) 20 MG tablet Take 1 tablet (20 mg total) by mouth every morning AND 2 tablets (40 mg total) every evening. 270 tablet 3  . doxepin (SINEQUAN) 10 MG capsule Take 1-2 caps by mouth at bedtime as needed for sleep    . folic acid (FOLVITE) 1 MG tablet Take 1 tablet (1 mg total) by mouth daily. 30 tablet 5  . hyoscyamine (LEVSIN SL) 0.125 MG SL tablet Place 0.125 mg under the tongue every 6 (six) hours as needed.     No facility-administered medications prior to visit.     Allergies  Allergen Reactions  . Aspirin Hypertension  . Nitroglycerin  Other (See Comments)    Headache    Review of Systems  Constitutional: Positive for malaise/fatigue. Negative for fever.  HENT: Negative for congestion.   Eyes: Negative for blurred vision.  Respiratory: Negative for shortness of breath.    Cardiovascular: Negative for chest pain, palpitations and leg swelling.  Gastrointestinal: Negative for abdominal pain, blood in stool and nausea.  Genitourinary: Negative for dysuria and frequency.  Musculoskeletal: Negative for falls.  Skin: Negative for rash.  Neurological: Negative for dizziness, loss of consciousness and headaches.  Endo/Heme/Allergies: Negative for environmental allergies.  Psychiatric/Behavioral: Negative for depression. The patient is not nervous/anxious.        Objective:    Physical Exam  BP 110/62 (BP Location: Left Arm, Patient Position: Sitting, Cuff Size: Normal)   Pulse 85   Temp (!) 97.1 F (36.2 C) (Temporal)   Resp 18   Wt 208 lb 6.4 oz (94.5 kg)   SpO2 97%   BMI 28.26 kg/m  Wt Readings from Last 3 Encounters:  08/23/19 208 lb 6.4 oz (94.5 kg)  07/12/19 211 lb 3.2 oz (95.8 kg)  06/29/19 207 lb 1.6 oz (93.9 kg)    Diabetic Foot Exam - Simple   No data filed     Lab Results  Component Value Date   WBC 1.6 Repeated and verified X2. (LL) 07/12/2019   HGB 10.7 (L) 07/12/2019   HCT 32.3 (L) 07/12/2019   PLT 31.0 Repeated and verified X2. (LL) 07/12/2019   GLUCOSE 104 (H) 08/08/2019   CHOL 124 04/13/2019   TRIG 83.0 04/13/2019   HDL 55.90 04/13/2019   LDLDIRECT 34.0 05/04/2018   LDLCALC 51 04/13/2019   ALT 18 07/12/2019   AST 62 (H) 07/12/2019   NA 139 08/08/2019   K 3.5 08/08/2019   CL 108 08/08/2019   CREATININE 1.86 (H) 08/08/2019   BUN 37 (H) 08/08/2019   CO2 21 (L) 08/08/2019   TSH 2.50 06/02/2019   PSA 0.49 11/01/2012   INR 1.2 05/16/2019   HGBA1C 4.8 04/13/2019    Lab Results  Component Value Date   TSH 2.50 06/02/2019   Lab Results  Component Value Date   WBC 1.6 Repeated and verified X2. (LL) 07/12/2019   HGB 10.7 (L) 07/12/2019   HCT 32.3 (L) 07/12/2019   MCV 104.1 (H) 07/12/2019   PLT 31.0 Repeated and verified X2. (LL) 07/12/2019   Lab Results  Component Value Date   NA 139 08/08/2019   K 3.5  08/08/2019   CO2 21 (L) 08/08/2019   GLUCOSE 104 (H) 08/08/2019   BUN 37 (H) 08/08/2019   CREATININE 1.86 (H) 08/08/2019   BILITOT 2.1 (H) 07/12/2019   ALKPHOS 417 (H) 07/12/2019   AST 62 (H) 07/12/2019   ALT 18 07/12/2019   PROT 7.1 07/12/2019   ALBUMIN 3.3 (L) 07/12/2019   CALCIUM 8.3 (L) 08/08/2019   ANIONGAP 10 08/08/2019   GFR 42.69 (L) 07/12/2019   Lab Results  Component Value Date   CHOL 124 04/13/2019   Lab Results  Component Value Date   HDL 55.90 04/13/2019   Lab Results  Component Value Date   LDLCALC 51 04/13/2019   Lab Results  Component Value Date   TRIG 83.0 04/13/2019   Lab Results  Component Value Date   CHOLHDL 2 04/13/2019   Lab Results  Component Value Date   HGBA1C 4.8 04/13/2019       Assessment & Plan:   Problem List Items Addressed This Visit  Hypertension (Chronic)    Well controlled, no changes to meds. Encouraged heart healthy diet such as the DASH diet and exercise as tolerated.       Chronic renal insufficiency, stage III (moderate) (HCC) (Chronic)    Stable and he has backed off of alcohol consumption. Will continue to monitor      Alcohol dependence ([place])    Nearly stopped, encouraged complete.       Hyperlipidemia    Tolerating statin, encouraged heart healthy diet, avoid trans fats, minimize simple carbs and saturated fats. Increase exercise as tolerated      Pedal edema    Continue with diuretics as directed by cardiology and reminded to elevate feet above heart 15 minutes several times a day and qhs. Wear compression hose and minimize sodium      Hyperglycemia    hgba1c acceptable, minimize simple carbs. Increase exercise as tolerated.       Abdominal pain    Has transitioned from diarrhea to constipation. Moves his bowels daily but it is small and hard to pass. He has sharp waves of abdominal pain that start low and move up. Likely colonic spasm. Advised to use Hyoscyamine prn when severe. Abdominal xray  ordered today and he is advised to start to slowly add Miralax with Benefiber daily. Start with a 1/4 dose and increase by a 1/4 dose at a time until bowels soften and move easier. Seek care if severe symptoms develop      Relevant Orders   DG Abd 2 Views    Other Visit Diagnoses    Constipation, unspecified constipation type    -  Primary   Relevant Orders   DG Abd 2 Views      I have discontinued [name] "Col"'s doxepin. I have also changed his hyoscyamine. Additionally, I am having him start on doxepin. Lastly, I am having him maintain his diphenoxylate-atropine, fluticasone-salmeterol, Blood Pressure Cuff, acetaminophen, colchicine, Ferrous Fumarate, thiamine, Uptravi, omeprazole, multivitamin, mometasone-formoterol, albuterol, allopurinol, rosuvastatin, torsemide, Mag-Oxide, tadalafil (PAH), potassium chloride, metolazone, Opsumit, and folic acid.  Meds ordered this encounter  Medications  . hyoscyamine (LEVSIN SL) 0.125 MG SL tablet    Sig: Place 1 tablet (0.125 mg total) under the tongue every 6 (six) hours as needed.    Dispense:  30 tablet    Refill:  1  . doxepin (SINEQUAN) 25 MG capsule    Sig: Take 1-2 capsules (25-50 mg total) by mouth at bedtime as needed.    Dispense:  60 capsule    Refill:  1  . folic acid (FOLVITE) 1 MG tablet    Sig: Take 1 tablet (1 mg total) by mouth daily.    Dispense:  30 tablet    Refill:  5     Penni Homans, MD

## 2019-08-23 NOTE — Patient Instructions (Signed)
Miralax with Benefiber 1/4 dose and increase as needed Constipation, Adult Constipation is when a person has fewer bowel movements in a week than normal, has difficulty having a bowel movement, or has stools that are dry, hard, or larger than normal. Constipation may be caused by an underlying condition. It may become worse with age if a person takes certain medicines and does not take in enough fluids. Follow these instructions at home: Eating and drinking   Eat foods that have a lot of fiber, such as fresh fruits and vegetables, whole grains, and beans.  Limit foods that are high in fat, low in fiber, or overly processed, such as french fries, hamburgers, cookies, candies, and soda.  Drink enough fluid to keep your urine clear or pale yellow. General instructions  Exercise regularly or as told by your health care provider.  Go to the restroom when you have the urge to go. Do not hold it in.  Take over-the-counter and prescription medicines only as told by your health care provider. These include any fiber supplements.  Practice pelvic floor retraining exercises, such as deep breathing while relaxing the lower abdomen and pelvic floor relaxation during bowel movements.  Watch your condition for any changes.  Keep all follow-up visits as told by your health care provider. This is important. Contact a health care provider if:  You have pain that gets worse.  You have a fever.  You do not have a bowel movement after 4 days.  You vomit.  You are not hungry.  You lose weight.  You are bleeding from the anus.  You have thin, pencil-like stools. Get help right away if:  You have a fever and your symptoms suddenly get worse.  You leak stool or have blood in your stool.  Your abdomen is bloated.  You have severe pain in your abdomen.  You feel dizzy or you faint. This information is not intended to replace advice given to you by your health care provider. Make sure you  discuss any questions you have with your health care provider. Document Released: 07/18/2004 Document Revised: 10/02/2017 Document Reviewed: 04/09/2016 Elsevier Patient Education  2020 [employer].

## 2019-08-23 NOTE — Assessment & Plan Note (Signed)
Well controlled, no changes to meds. Encouraged heart healthy diet such as the DASH diet and exercise as tolerated.

## 2019-08-23 NOTE — Assessment & Plan Note (Signed)
Continue with diuretics as directed by cardiology and reminded to elevate feet above heart 15 minutes several times a day and qhs. Wear compression hose and minimize sodium

## 2019-08-23 NOTE — Assessment & Plan Note (Signed)
Nearly stopped, encouraged complete.

## 2019-08-23 NOTE — Assessment & Plan Note (Signed)
He just lost his mother in past couple of weeks and lost his Dad this year as well. He is having a hard time with sleep mostly. He has good family support. He notes the Doxepin at 20 mg was helping before he lost his mom so will try 25 mg +/- Melatonin and see if that helps

## 2019-08-23 NOTE — Assessment & Plan Note (Signed)
Stable and he has backed off of alcohol consumption. Will continue to monitor

## 2019-08-23 NOTE — Assessment & Plan Note (Signed)
Has transitioned from diarrhea to constipation. Moves his bowels daily but it is small and hard to pass. He has sharp waves of abdominal pain that start low and move up. Likely colonic spasm. Advised to use Hyoscyamine prn when severe. Abdominal xray ordered today and he is advised to start to slowly add Miralax with Benefiber daily. Start with a 1/4 dose and increase by a 1/4 dose at a time until bowels soften and move easier. Seek care if severe symptoms develop

## 2019-08-23 NOTE — Assessment & Plan Note (Signed)
Tolerating statin, encouraged heart healthy diet, avoid trans fats, minimize simple carbs and saturated fats. Increase exercise as tolerated

## 2019-08-23 NOTE — Assessment & Plan Note (Signed)
hgba1c acceptable, minimize simple carbs. Increase exercise as tolerated.  

## 2019-08-26 ENCOUNTER — Encounter (HOSPITAL_COMMUNITY): Payer: Self-pay

## 2019-08-26 ENCOUNTER — Other Ambulatory Visit: Payer: Self-pay

## 2019-08-26 ENCOUNTER — Ambulatory Visit (HOSPITAL_COMMUNITY)
Admission: RE | Admit: 2019-08-26 | Discharge: 2019-08-26 | Disposition: A | Discharge status: Home / Self Care | Payer: Medicare Other | Source: Ambulatory Visit | Attending: Internal Medicine | Admitting: Internal Medicine

## 2019-08-26 ENCOUNTER — Ambulatory Visit (INDEPENDENT_AMBULATORY_CARE_PROVIDER_SITE_OTHER): Payer: Medicare Other | Admitting: *Deleted

## 2019-08-26 DIAGNOSIS — I2721 Secondary pulmonary arterial hypertension: Secondary | ICD-10-CM | POA: Diagnosis not present

## 2019-08-26 DIAGNOSIS — I5033 Acute on chronic diastolic (congestive) heart failure: Secondary | ICD-10-CM

## 2019-08-26 DIAGNOSIS — K766 Portal hypertension: Secondary | ICD-10-CM | POA: Diagnosis not present

## 2019-08-26 DIAGNOSIS — N1832 Chronic kidney disease, stage 3b: Secondary | ICD-10-CM

## 2019-08-26 DIAGNOSIS — R55 Syncope and collapse: Secondary | ICD-10-CM

## 2019-08-26 DIAGNOSIS — E876 Hypokalemia: Secondary | ICD-10-CM

## 2019-08-26 DIAGNOSIS — I5032 Chronic diastolic (congestive) heart failure: Secondary | ICD-10-CM

## 2019-08-26 MED ORDER — TORSEMIDE 20 MG PO TABS: 60.0000 mg | ORAL_TABLET | Freq: Two times a day (BID) | ORAL | 3 refills | Status: DC

## 2019-08-26 MED FILL — TORSEMIDE 20 MG TABLET: 20 | 90 days supply | Qty: 540 | Fill #0

## 2019-08-26 NOTE — Addendum Note (Signed)
Encounter addended by: Valeda Malm, RN on: 08/26/2019 2:44 PM  Actions taken: Pharmacy for encounter modified, Diagnosis association updated, Order list changed, Clinical Note Signed

## 2019-08-26 NOTE — Addendum Note (Signed)
Encounter addended by: Valeda Malm, RN on: 08/26/2019 2:47 PM  Actions taken: Clinical Note Signed

## 2019-08-26 NOTE — Patient Instructions (Addendum)
INCREASE Torsemide to 65m (3 tabs) twice a day  Lab visit on Monday at 12pm We will only contact you if something comes back abnormal or we need to make some changes. Otherwise no news is good news!  Your physician recommends that you schedule a follow-up appointment in: 1 week telehealth visit with Dr BHaroldine Laws [facility] you and your health needs are our priority. As part of our continuing mission to provide you with exceptional heart care, we have created designated Provider Care Teams. These Care Teams include your primary Cardiologist (physician) and Advanced Practice Providers (APPs- Physician Assistants and Nurse Practitioners) who all work together to provide you with the care you need, when you need it.   You may see any of the following providers on your designated Care Team at your next follow up: .Marland KitchenDr DGlori Bickers. Dr DLoralie Champagne. ADarrick Grinder NP . BLyda Jester PA   Please be sure to bring in all your medications bottles to every appointment.

## 2019-08-26 NOTE — Progress Notes (Addendum)
Spoke with patient, aware of med changes and need for lab work on Monday.  Pt scheduled for labs and 1 week televisit.  Information sent on mychart as requested by patient

## 2019-08-26 NOTE — Progress Notes (Signed)
Heart Failure TeleHealth Note  Due to national recommendations of social distancing due to [place] 19, Audio/video telehealth visit is felt to be most appropriate for this patient at this time.  See MyChart message from today for patient consent regarding telehealth for [facility].  Date:  08/26/2019   ID:  Darren Mueller, DOB Aug 20, 1951, MRN [id number]  Location: Home  Provider location: [facility] Type of Visit: Established patient  PCP:  Mosie Lukes, MD  Cardiologist:  Glori Bickers, MD Primary HF: Bensimhon  Chief Complaint: Heart Failure follow-up   History of Present Illness:  Darren Mueller ("[name]") is a 68yo with history of COPD, portopulmonary hypertension with RV failure, and ETOH cirrhosis.  Admitted in 6/17 with CP and SOB. Underwent R/L cath. Minimal CAD with moderate PAH and preserved cardiac output.   Admitted 06/2016 and 07/2016 with syncope. On September admission found to have elevated ETOH level as well as R 6th rib fracture. Wore 30 day monitor up until 08/28/16. No AF noted. No events.   Admitted February2018after syncopal event. LINQ placed.   Was admitted 4/20 with hematochezia and melena. His hemoglobin remained stable. No episodes of GI bleed after hospitalization. Underwent EGD by GI with finding of Grade 1 and a small esophageal varices, no evidence of recent bleeding,portal hypertensive gastropathy. Started on nadolol which made him very dizzy. He stopped this and feels a lot   Admitted  7/13-16/20 with volume overload. Diuresed 21 pounds. D/c weight 192.  Echo  05/17/19: LVEF > 65% RV severely dilated with moderate RV dysfunction. Moderate TR RVSP 28mHG. While in hospital was pancytopenic. Possibility of BMBx discussed with Dr. SAlen Blewbut felt it was not necessary as it was felt he had splenic sequestration and counts were low but very stable.  He presents via aEngineer, civil (consulting)for a  telehealth visit today. We had a call with him last week and volume was up in the setting of missing diuretic due to his mother's death. WE then gave him some metolazone which worked but when he started metolazone he stopped torsemide so last week we restarted torsemide at higher dose (60 bid) and jump started him with several doses of metolazone. Weight down from 217 -> 203. Feeling better. Still with mark swelling in feet. Taking torsemide 40 tid instead of 60 bid. No longer taking metolazone. Weight coming down. Breathing fine. No orthopnea or PND.    PAH meds 1) Macitnentan 10 mg daily 2) Adcirca 40 mg daily  3) Selexipeg 1000/1200 daily   Studies: ECHO 12/14 EF 55-60% Peak PA pressure 35. Severe RV dysfunction  ECHO 7/15 EF 60% RV moderately to severely dilated. Moderate HK RVSP 594mHG  ECHO 6/16 EF 60% RV moderately to severely dilated. Severe HK RVSP ~65 mm HG. D-shaped septum Echo 2/17 LVEF 60-65% RV massively dilated. Flat septum. Severe HK. Moderate TR RVSP ~65. IVC small. No effusion  ECHO 01/01/2017 EF 50-55% Grade I DD. RV severely dilated. Peak PA pressure 62 mm hg Echo 9/19 with EF 60-65% RV markedly dilated and hypokinetic with D-shaped septum. RVSP 6869m  Cath 6/17 Mid RCA lesion, 20% stenosed. Dist LAD lesion, 20% stenosed. Ao = 107/70 (87) LV = 106/3/11 RA = 4 RV = 74/11 PA = 74/26 (45) PCW = 6 Fick cardiac output/index = 4.6.2.2 PVR = 8.5 Ao sat = 95% PA sat = 61%, 58%  RHC 2/17 RA = 11 RV = 80/16/19 PA = 83/61 (71)  PCW = 9 Fick cardiac output/index = 5.6/2.6 PVR = 10.3 WU Ao sat = 94% PA sat = 65%, 68% High SVC sat = 65%   PFTs 3/14 FEV1 2.02 L (58%) FVC 2.7L (54%) FEV1/FVC (89%) DLCO 53%  PFTs 1/17 FEV1 2.05 L (61%) FVC 2.48L (56%) DLCO 44%   Ab u/s 8/16: + cirrhosis/mild to moderate splenomegaly  6 min walk 01/10/13, 1290 feet  6MW (01/10/14) = 1280 feet (380 m) 6MW (9/15) = 1350 feet (411 m) O2 sats ranged from 89-95% on  room air, HR ranged from 100-129. 6MW (6/16) = 384 meters  6MW (2/17) = 1250 feet (33m 6MW (8/17) = 1320 feet    CLanier Ensigndenies symptoms worrisome for COVID 19.   Past Medical History:  Diagnosis Date  . Alcohol dependence ([place] 03/22/2011   In remission   . Alcohol dependence in remission ([facility] 03/22/2011   In remission   . Alcoholic cirrhosis of liver with ascites ([facility] 2014  . Anginal pain ([facility]    with SOB admitted 04/20/2019  . Cardiomegaly 02/2019   Noted on CXR  . CHF (congestive heart failure) ([facility]   . CKD (chronic kidney disease), stage III ([facility] 07/18/2013  . COPD (chronic obstructive pulmonary disease) ([place]   . Coronary artery disease   . Dermatitis 06/10/2015  . Diarrhea 09/04/2013  . Dyspnea    admitted 04/20/2019  . Elbow pain, left 03/17/2017  . Eustachian tube dysfunction   . GERD (gastroesophageal reflux disease)   . Gout   . Headache   . Hearing loss   . Heart murmur   . Hx of colonic polyps   . Hypertension   . Hypertriglyceridemia 03/17/2017  . Hypopotassemia   . Otalgia 11/28/2013  . Other chronic pulmonary heart diseases   . PAH (pulmonary artery hypertension) (HCC)    RV failure  . Pancytopenia ([facility]   . Pleural effusion 2014   Noted on CT:   Moderately large simple layering right pleural effusion   . Preventative health care 06/29/2016  . Pulmonary nodule 2014   Noted on CT: An 8 mm pulmonary nodule in the periphery of the left lower lobe  . Rectal bleeding   . Red eye 03/03/2016   right  . Seizures (HF[place]   . Sleep apnea    does not wear CPAP  . Splenomegaly 12/2015   Noted on CT  . Thoracic aorta atherosclerosis ([facility] 12/2017  . Thrombocytopenia ([facility] 06/29/2016  . Unspecified hypothyroidism 01/25/2013  . Unspecified pleural effusion   . Vertigo   . Wears glasses    Past Surgical History:  Procedure Laterality Date  . CARDIAC CATHETERIZATION N/A 12/21/2015   Procedure: Right Heart Cath;  Surgeon: DJolaine Artist MD;   [facility];  Service: Cardiovascular;  Laterality: N/A;  . CARDIAC CATHETERIZATION N/A 04/15/2016   Procedure: Right/Left Heart Cath and Coronary Angiography;  Surgeon: DJolaine Artist MD;  [facility];  Service: Cardiovascular;  Laterality: N/A;  . COLONOSCOPY N/A 12/08/2013   Procedure: COLONOSCOPY;  Surgeon: DMilus Banister MD;  [facility];  Service: Endoscopy;  Laterality: N/A;  . COLONOSCOPY WITH PROPOFOL N/A 03/24/2019   Procedure: COLONOSCOPY WITH PROPOFOL;  Surgeon: JMilus Banister MD;  Location: [facility];  Service: Endoscopy;  Laterality: N/A;  Draw CBC in preop  . COLONOSCOPY WITH PROPOFOL N/A 04/28/2019   Procedure: COLONOSCOPY WITH PROPOFOL;  Surgeon: JMilus Banister MD;  Location: [facility];  Service:  Endoscopy;  Laterality: N/A;  . ESOPHAGOGASTRODUODENOSCOPY (EGD) WITH PROPOFOL N/A 03/27/2016   Procedure: ESOPHAGOGASTRODUODENOSCOPY (EGD) WITH PROPOFOL;  Surgeon: Milus Banister, MD;  Location: [facility];  Service: Endoscopy;  Laterality: N/A;  . ESOPHAGOGASTRODUODENOSCOPY (EGD) WITH PROPOFOL N/A 02/14/2019   Procedure: ESOPHAGOGASTRODUODENOSCOPY (EGD) WITH PROPOFOL;  Surgeon: Jerene Bears, MD;  Location: [facility] ENDOSCOPY;  Service: Gastroenterology;  Laterality: N/A;  . EYE SURGERY Bilateral 03/15/2019   lasix  . HEMOSTASIS CLIP PLACEMENT  04/28/2019   Procedure: HEMOSTASIS CLIP PLACEMENT;  Surgeon: Milus Banister, MD;  Location: [facility]OS[facility];  Service: Endoscopy;;  . LOOP RECORDER INSERTION N/A 01/01/2017   Procedure: Loop Recorder Insertion;  Surgeon: Thompson Grayer, MD;  Location: [facility];  Service: Cardiovascular;  Laterality: N/A;  . POLYPECTOMY  04/28/2019   Procedure: POLYPECTOMY;  Surgeon: Milus Banister, MD;  Location: [facility];  Service: Endoscopy;;  . RIGHT HEART CATHETERIZATION Right 06/06/2014   Procedure: RIGHT HEART CATH;  Surgeon: Jolaine Artist, MD;  Location: [facility] CATH LAB;  Service:  Cardiovascular;  Laterality: Right;  . UVULOPALATOPHARYNGOPLASTY  1999     Current Outpatient Medications  Medication Sig Dispense Refill  . acetaminophen (TYLENOL) 500 MG tablet Take 1,000 mg by mouth every 6 (six) hours as needed (for pain.).    Marland Kitchen albuterol (VENTOLIN HFA) 108 (90 Base) MCG/ACT inhaler Inhale 2 puffs into the lungs every 6 (six) hours as needed for wheezing or shortness of breath.    . allopurinol (ZYLOPRIM) 100 MG tablet Take 2 tablets (200 mg total) by mouth daily. 180 tablet 1  . Blood Pressure Monitoring (BLOOD PRESSURE CUFF) MISC Use as directed. Check blood pressure bid prn Dx:I10 1 each 0  . colchicine 0.6 MG tablet Take 0.6 mg by mouth daily as needed (gout flare).    . diphenoxylate-atropine (LOMOTIL) 2.5-0.025 MG tablet Take 1 tablet by mouth 3 (three) times daily. 60 tablet 0  . doxepin (SINEQUAN) 25 MG capsule Take 1-2 capsules (25-50 mg total) by mouth at bedtime as needed. 60 capsule 1  . Ferrous Fumarate (HEMOCYTE) 324 (106 Fe) MG TABS tablet Take 1 tablet (106 mg of iron total) by mouth daily. 30 tablet 5  . fluticasone-salmeterol (ADVAIR HFA) 230-21 MCG/ACT inhaler Inhale 2 puffs into the lungs every morning. 48 g 1  . folic acid (FOLVITE) 1 MG tablet Take 1 tablet (1 mg total) by mouth daily. 30 tablet 5  . hyoscyamine (LEVSIN SL) 0.125 MG SL tablet Place 1 tablet (0.125 mg total) under the tongue every 6 (six) hours as needed. 30 tablet 1  . macitentan (OPSUMIT) 10 MG tablet Take 1 tablet (10 mg total) by mouth daily. 90 tablet 0  . Magnesium Oxide (MAG-OXIDE) 200 MG TABS Take 2 tablets (400 mg total) by mouth daily. 180 tablet 3  . metolazone (ZAROXOLYN) 2.5 MG tablet Take 1 tablet (2.5 mg total) by mouth as directed. As directed by heart failure team 30 tablet 0  . mometasone-formoterol (DULERA) 100-5 MCG/ACT AERO Inhale 2 puffs into the lungs 2 (two) times daily.    . Multiple Vitamin (MULTIVITAMIN) tablet Take 1 tablet by mouth daily.    Marland Kitchen omeprazole  (PRILOSEC) 40 MG capsule Take 40 mg by mouth daily.    . potassium chloride (K-DUR) 10 MEQ tablet Take 6 tablets (60 mEq total) by mouth daily. 270 tablet 3  . rosuvastatin (CRESTOR) 5 MG tablet Take 1 tablet (5 mg total) by mouth daily. 90 tablet 3  . Selexipag (UPTRAVI)  1000 MCG TABS Take 1,000 mcg by mouth 2 (two) times daily. 60 tablet 11  . tadalafil, PAH, (ADCIRCA) 20 MG tablet Take 2 tablets (40 mg total) by mouth daily. 90 tablet 1  . thiamine 100 MG tablet Take 1 tablet (100 mg total) by mouth daily. 30 tablet 5  . torsemide (DEMADEX) 20 MG tablet Take 1 tablet (20 mg total) by mouth every morning AND 2 tablets (40 mg total) every evening. 270 tablet 3   No current facility-administered medications for this encounter.     Allergies:   Aspirin and Nitroglycerin   Social History:  The patient  reports that he quit smoking about 39 years ago. His smoking use included cigarettes. He has a 10.00 pack-year smoking history. He has never used smokeless tobacco. He reports current alcohol use of about 6.0 standard drinks of alcohol per week. He reports that he does not use drugs.   Family History:  The patient's family history includes Alcoholism in his maternal grandfather; Cancer in his father; Gout in his brother; Heart attack in his mother; Heart disease in his mother; Hypertension in his brother, brother, and mother; Kidney failure in his mother; Liver disease in his maternal grandfather and mother; Migraines in his sister; Prostate cancer in his father.   ROS:  Please see the history of present illness.   All other systems are personally reviewed and negative.   Exam:  (Video/Tele Health Call; Exam is subjective and or/visual.) General:  Speaks in full sentences. No resp difficulty. Lungs: Normal respiratory effort with conversation.  Abdomen: distended per patient report Extremities: Pt endorses significant pedal edema bialterally Neuro: Alert & oriented x 3.   Recent Labs:  05/16/2019: B Natriuretic Peptide 1,119.2; Pro B Natriuretic peptide (BNP) 1,293.0 06/02/2019: TSH 2.50 07/12/2019: ALT 18; Hemoglobin 10.7; Platelets 31.0 Repeated and verified X2. 07/20/2019: Magnesium 1.7 08/08/2019: BUN 37; Creatinine, Ser 1.86; Potassium 3.5; Sodium 139  Personally reviewed   Wt Readings from Last 3 Encounters:  08/23/19 94.5 kg (208 lb 6.4 oz)  07/12/19 95.8 kg (211 lb 3.2 oz)  06/29/19 93.9 kg (207 lb 1.6 oz)      ASSESSMENT AND PLAN:  1. PAH: Suspected portopulmonary HTN in setting of ETOH cirrhosis. - Chronic NYHA III. Somewhat improved with volume removal  -6MW 9/19 1280 feet (390 meters) - Echo 9/19 with EF 60-65% RV markedly dilated and hypokinetic with D-shaped septum - Echo  05/17/19: LVEF > 65% RV severely dilated with moderate RV dysfunction. Septal flattening Moderate TR RVSP 68mHG. - Continue selexipag 1000 mcg twice a day. Intolerant higher dose due to diarrhea and headaches .  - Continue macitentan 10 daily and adcirca 40 mg daily. - With RV strain on echo may need to consider repeat RHC and  IV therapies in the near future.    2. Diastolic HF - Volume status improving but still not at baseline (baseline weight likely closer to 190-195) - Change torsemide from 40 tid to 60 bid  - Can use metolazone as needed - check labs Monday - f/u televist next week  3. CKD III:  -Baseline creatinine 1.7-2.0. Was 2.3 on 07/20/19. - Followed by Dr. CMarval Regal - Recheck Monday  4. Cirrhosis:  - Likely combination of RV failure and ETOH - Follows with Dr. JArdis Hughs - Did not tolerate nadolol.  - EGD 4/20 stable - Stressed need for ETOH cessation  5. Recurrent syncope/seizure - LINQ interrogations have shown no arrhythmias and thus most likely seizure - Continue Keppra.  Follows with Dr. Delice Lesch  6. HTN:  -.Blood pressure well controlled. Continue current regimen.  7. OSA:  - Has mild OSA with AHI 12 and desats down to 82%.  - Intolerant CPAP.    8. Pancytopenia:  - Has seen Dr. Alen Blew in Hematology - felt like it is due to splenic sequestration.  - No role for BMBx current per Dr. Alen Blew    9. Hypokalemia - recent potassium 3.0 on 07/20/19 - taking 60 daily kcl.  - BMET Monday  10. Hypomag - continue magOx 400 daily  COVID screen The patient does not have any symptoms that suggest any further testing/ screening at this time. Social distancing reinforced today.  Patient Risk: After full review of this patients clinical status, I feel that they are at moderate risk for cardiac decompensation at this time.  Relevant cardiac medications were reviewed at length with the patient today. The patient does not have concerns regarding their medications at this time.   Recommended follow-up:1 week televisit   Today, I have spent12 minutes with the patient with telehealth technology discussing the above issues .   Signed, Glori Bickers, MD  08/26/2019 1:22 PM  [facility] [place] [address] [facility] and [place] [address] [phone] (office) [phone] (fax)

## 2019-08-27 LAB — CUP PACEART REMOTE DEVICE CHECK
Date Time Interrogation Session: 20201023154210
Implantable Pulse Generator Implant Date: 20180301

## 2019-08-29 ENCOUNTER — Other Ambulatory Visit: Payer: Self-pay

## 2019-08-29 ENCOUNTER — Ambulatory Visit (HOSPITAL_COMMUNITY)
Admission: RE | Admit: 2019-08-29 | Discharge: 2019-08-29 | Disposition: A | Discharge status: Home / Self Care | Payer: Medicare Other | Source: Ambulatory Visit | Attending: Cardiology | Admitting: Cardiology

## 2019-08-29 ENCOUNTER — Encounter (HOSPITAL_COMMUNITY): Payer: Self-pay | Admitting: *Deleted

## 2019-08-29 ENCOUNTER — Telehealth (HOSPITAL_COMMUNITY): Payer: Self-pay | Admitting: *Deleted

## 2019-08-29 ENCOUNTER — Telehealth: Payer: Self-pay

## 2019-08-29 DIAGNOSIS — K766 Portal hypertension: Secondary | ICD-10-CM | POA: Insufficient documentation

## 2019-08-29 DIAGNOSIS — I5022 Chronic systolic (congestive) heart failure: Secondary | ICD-10-CM

## 2019-08-29 DIAGNOSIS — I2721 Secondary pulmonary arterial hypertension: Secondary | ICD-10-CM | POA: Insufficient documentation

## 2019-08-29 LAB — BASIC METABOLIC PANEL
Anion gap: 12 (ref 5–15)
BUN: 59 mg/dL — ABNORMAL HIGH (ref 8–23)
CO2: 27 mmol/L (ref 22–32)
Calcium: 8.8 mg/dL — ABNORMAL LOW (ref 8.9–10.3)
Chloride: 95 mmol/L — ABNORMAL LOW (ref 98–111)
Creatinine, Ser: 2.75 mg/dL — ABNORMAL HIGH (ref 0.61–1.24)
GFR calc Af Amer: 26 mL/min — ABNORMAL LOW (ref >60)
GFR calc non Af Amer: 23 mL/min — ABNORMAL LOW (ref >60)
Glucose, Bld: 123 mg/dL — ABNORMAL HIGH (ref 70–99)
Potassium: 2.4 mmol/L — CL (ref 3.5–5.1)
Sodium: 134 mmol/L — ABNORMAL LOW (ref 135–145)

## 2019-08-29 MED ORDER — POTASSIUM CHLORIDE ER 10 MEQ PO TBCR: 60.0000 meq | EXTENDED_RELEASE_TABLET | Freq: Two times a day (BID) | ORAL | 3 refills | Status: DC

## 2019-08-29 MED FILL — POTASSIUM CHL ER M10 TABLET: 10 | 30 days supply | Qty: 360 | Fill #0

## 2019-08-29 NOTE — Telephone Encounter (Signed)
Lab called to report critical potassium (2.6)  Per Dr.Bensimhon take Potassium 19mq three times today. Then new dose of potassium will be 675m twice daily. Recheck labs Thursday then again next Tuesday. Pt aware and lab appts scheduled.

## 2019-08-29 NOTE — Telephone Encounter (Signed)
Ok then stop the Doxepin and remove from [facility] and let him try Hydroxyzine 25 mg at bedtime. Disp #30 and let's see how that does

## 2019-08-29 NOTE — Telephone Encounter (Signed)
Patient came in and stated the doxepin that was rx to him is not working, he wants to know what he else he can do.  Please advise

## 2019-08-30 MED ORDER — HYDROXYZINE HCL 25 MG PO TABS: 25.0000 mg | ORAL_TABLET | Freq: Three times a day (TID) | ORAL | 0 refills | Status: DC | PRN

## 2019-08-30 MED FILL — hydrOXYzine HCL 25 MG TABS: 25 | 10 days supply | Qty: 30 | Fill #0

## 2019-08-30 NOTE — Telephone Encounter (Signed)
Patient notified about medication change

## 2019-08-30 NOTE — Telephone Encounter (Signed)
Per Dr.Bensimhon hold ALL diuretics for two days. Spoke with patient he verbalized understanding

## 2019-08-30 NOTE — Addendum Note (Signed)
Addended by: Magdalene Molly A on: 08/30/2019 11:45 AM   Modules accepted: Orders

## 2019-08-30 NOTE — Addendum Note (Signed)
Addended by: Magdalene Molly A on: 08/30/2019 11:44 AM   Modules accepted: Orders

## 2019-08-31 ENCOUNTER — Other Ambulatory Visit: Payer: Self-pay | Admitting: Medical

## 2019-09-01 ENCOUNTER — Other Ambulatory Visit: Payer: Self-pay

## 2019-09-01 ENCOUNTER — Ambulatory Visit (HOSPITAL_COMMUNITY)
Admission: RE | Admit: 2019-09-01 | Discharge: 2019-09-01 | Disposition: A | Discharge status: Home / Self Care | Payer: Medicare Other | Source: Ambulatory Visit | Attending: Cardiology | Admitting: Cardiology

## 2019-09-01 ENCOUNTER — Ambulatory Visit (HOSPITAL_COMMUNITY)
Admission: RE | Admit: 2019-09-01 | Discharge: 2019-09-01 | Disposition: A | Discharge status: Home / Self Care | Payer: Medicare Other | Source: Ambulatory Visit | Attending: Internal Medicine | Admitting: Internal Medicine

## 2019-09-01 DIAGNOSIS — K7030 Alcoholic cirrhosis of liver without ascites: Secondary | ICD-10-CM | POA: Insufficient documentation

## 2019-09-01 DIAGNOSIS — I2729 Other secondary pulmonary hypertension: Secondary | ICD-10-CM | POA: Diagnosis not present

## 2019-09-01 DIAGNOSIS — M109 Gout, unspecified: Secondary | ICD-10-CM | POA: Insufficient documentation

## 2019-09-01 DIAGNOSIS — N179 Acute kidney failure, unspecified: Secondary | ICD-10-CM

## 2019-09-01 DIAGNOSIS — K766 Portal hypertension: Secondary | ICD-10-CM | POA: Diagnosis not present

## 2019-09-01 DIAGNOSIS — Z79899 Other long term (current) drug therapy: Secondary | ICD-10-CM | POA: Insufficient documentation

## 2019-09-01 DIAGNOSIS — J449 Chronic obstructive pulmonary disease, unspecified: Secondary | ICD-10-CM | POA: Insufficient documentation

## 2019-09-01 DIAGNOSIS — Z87891 Personal history of nicotine dependence: Secondary | ICD-10-CM | POA: Insufficient documentation

## 2019-09-01 DIAGNOSIS — I130 Hypertensive heart and chronic kidney disease with heart failure and stage 1 through stage 4 chronic kidney disease, or unspecified chronic kidney disease: Secondary | ICD-10-CM | POA: Insufficient documentation

## 2019-09-01 DIAGNOSIS — K219 Gastro-esophageal reflux disease without esophagitis: Secondary | ICD-10-CM | POA: Diagnosis not present

## 2019-09-01 DIAGNOSIS — G4733 Obstructive sleep apnea (adult) (pediatric): Secondary | ICD-10-CM | POA: Diagnosis not present

## 2019-09-01 DIAGNOSIS — I50810 Right heart failure, unspecified: Secondary | ICD-10-CM

## 2019-09-01 DIAGNOSIS — E876 Hypokalemia: Secondary | ICD-10-CM

## 2019-09-01 DIAGNOSIS — E039 Hypothyroidism, unspecified: Secondary | ICD-10-CM | POA: Diagnosis not present

## 2019-09-01 DIAGNOSIS — Z7951 Long term (current) use of inhaled steroids: Secondary | ICD-10-CM | POA: Insufficient documentation

## 2019-09-01 DIAGNOSIS — Z886 Allergy status to analgesic agent status: Secondary | ICD-10-CM | POA: Insufficient documentation

## 2019-09-01 DIAGNOSIS — Z86010 Personal history of colonic polyps: Secondary | ICD-10-CM | POA: Insufficient documentation

## 2019-09-01 DIAGNOSIS — Z8249 Family history of ischemic heart disease and other diseases of the circulatory system: Secondary | ICD-10-CM | POA: Diagnosis not present

## 2019-09-01 DIAGNOSIS — I2721 Secondary pulmonary arterial hypertension: Secondary | ICD-10-CM | POA: Diagnosis not present

## 2019-09-01 DIAGNOSIS — D61818 Other pancytopenia: Secondary | ICD-10-CM | POA: Diagnosis not present

## 2019-09-01 DIAGNOSIS — N183 Chronic kidney disease, stage 3 unspecified: Secondary | ICD-10-CM | POA: Insufficient documentation

## 2019-09-01 DIAGNOSIS — I5033 Acute on chronic diastolic (congestive) heart failure: Secondary | ICD-10-CM | POA: Insufficient documentation

## 2019-09-01 DIAGNOSIS — R55 Syncope and collapse: Secondary | ICD-10-CM | POA: Diagnosis not present

## 2019-09-01 DIAGNOSIS — I5022 Chronic systolic (congestive) heart failure: Secondary | ICD-10-CM

## 2019-09-01 DIAGNOSIS — I2510 Atherosclerotic heart disease of native coronary artery without angina pectoris: Secondary | ICD-10-CM | POA: Insufficient documentation

## 2019-09-01 LAB — BASIC METABOLIC PANEL
Anion gap: 9 (ref 5–15)
BUN: 58 mg/dL — ABNORMAL HIGH (ref 8–23)
CO2: 26 mmol/L (ref 22–32)
Calcium: 8.5 mg/dL — ABNORMAL LOW (ref 8.9–10.3)
Chloride: 101 mmol/L (ref 98–111)
Creatinine, Ser: 2.38 mg/dL — ABNORMAL HIGH (ref 0.61–1.24)
GFR calc Af Amer: 31 mL/min — ABNORMAL LOW (ref >60)
GFR calc non Af Amer: 27 mL/min — ABNORMAL LOW (ref >60)
Glucose, Bld: 113 mg/dL — ABNORMAL HIGH (ref 70–99)
Potassium: 2.8 mmol/L — ABNORMAL LOW (ref 3.5–5.1)
Sodium: 136 mmol/L (ref 135–145)

## 2019-09-01 MED ORDER — TORSEMIDE 20 MG PO TABS: ORAL_TABLET | ORAL | 3 refills | Status: DC

## 2019-09-01 MED ORDER — POTASSIUM CHLORIDE ER 10 MEQ PO TBCR: 40.0000 meq | EXTENDED_RELEASE_TABLET | Freq: Two times a day (BID) | ORAL | 3 refills | Status: DC

## 2019-09-01 NOTE — Patient Instructions (Addendum)
RESTART Torsemide 60 mg (3 tabs) in the AM and 40 mg (2 tabs) in the PM TAKE 60 meq (6 tabs) today, the 40 meq (4 tabs) twice daily there after  Labs are needed every week for the next 3 weeks.(home health)  Your physician recommends that you schedule a follow-up appointment in: 3 weeks with Dr Haroldine Laws (virtual/telehealth)    At the [facility], you and your health needs are our priority. As part of our continuing mission to provide you with exceptional heart care, we have created designated Provider Care Teams. These Care Teams include your primary Cardiologist (physician) and Advanced Practice Providers (APPs- Physician Assistants and Nurse Practitioners) who all work together to provide you with the care you need, when you need it.   You may see any of the following providers on your designated Care Team at your next follow up: Marland Kitchen Dr Glori Bickers . Dr Loralie Champagne . Darrick Grinder, NP . [name], PA   Please be sure to bring in all your medications bottles to every appointment.

## 2019-09-01 NOTE — Addendum Note (Signed)
Encounter addended by: Kerry Dory, CMA on: 09/01/2019 7:39 PM  Actions taken: Pharmacy for encounter modified, Order list changed, Clinical Note Signed

## 2019-09-01 NOTE — Progress Notes (Signed)
Heart Failure TeleHealth Note  Due to national recommendations of social distancing due to COVID 19, Audio/video telehealth visit is felt to be most appropriate for this patient at this time.  See MyChart message from today for patient consent regarding telehealth for [facility].  Date:  09/01/2019   ID:  Darren Mueller, DOB 18-Nov-1950, MRN [id number]  Location: Home  Provider location: [facility] Type of Visit: Established patient  PCP:  [name], MD  Cardiologist:  [name], MD Primary HF: Josuha Fontanez  Chief Complaint: Heart Failure follow-up   History of Present Illness:  Darren Mueller ("Darren Mueller") is a 68yo with history of COPD, portopulmonary hypertension with RV failure, and ETOH cirrhosis.  Admitted in 6/17 with CP and SOB. Underwent R/L cath. Minimal CAD with moderate PAH and preserved cardiac output.   Admitted 06/2016 and 07/2016 with syncope. On September admission found to have elevated ETOH level as well as R 6th rib fracture. Wore 30 day monitor up until 08/28/16. No AF noted. No events.   Admitted February2018after syncopal event. LINQ placed.   Was admitted 4/20 with hematochezia and melena. His hemoglobin remained stable. No episodes of GI bleed after hospitalization. Underwent EGD by GI with finding of Grade 1 and a small esophageal varices, no evidence of recent bleeding,portal hypertensive gastropathy. Started on nadolol which made him very dizzy. He stopped this and feels a lot   Admitted  7/13-16/20 with volume overload. Diuresed 21 pounds. D/c weight 192.  Echo  05/17/19: LVEF > 65% RV severely dilated with moderate RV dysfunction. Moderate TR RVSP . While in hospital was pancytopenic. Possibility of BMBx discussed with Dr. Clelia Croft but felt it was not necessary as it was felt he had splenic sequestration and counts were low but very stable.  He presents via Web designer for a  telehealth visit today. He has been struggling with fluid overload and we have been titrating torsemide and metolazone. Creatinine bumped to 2.75 earlier this week. So we held all diuretics on Monday. Weight stable at 203. Still with edema. No orthopnea or PND.   PAH meds 1) Macitnentan 10 mg daily 2) Adcirca 40 mg daily  3) Selexipeg 1000/1200 daily   Studies: ECHO 12/14 EF 55-60% Peak PA pressure 35. Severe RV dysfunction  ECHO 7/15 EF 60% RV moderately to severely dilated. Moderate HK RVSP 57mm HG  ECHO 6/16 EF 60% RV moderately to severely dilated. Severe HK RVSP ~65 mm HG. D-shaped septum Echo 2/17 LVEF 60-65% RV massively dilated. Flat septum. Severe HK. Moderate TR RVSP ~65. IVC small. No effusion  ECHO 01/01/2017 EF 50-55% Grade I DD. RV severely dilated. Peak PA pressure 62 mm hg Echo 9/19 with EF 60-65% RV markedly dilated and hypokinetic with D-shaped septum. RVSP  Cath 6/17 Mid RCA lesion, 20% stenosed. Dist LAD lesion, 20% stenosed. Ao = 107/70 (87) LV = 106/3/11 RA = 4 RV = 74/11 PA = 74/26 (45) PCW = 6 Fick cardiac output/index = 4.6.2.2 PVR = 8.5 Ao sat = 95% PA sat = 61%, 58%  RHC 2/17 RA = 11 RV = 80/16/19 PA = 83/61 (71) PCW = 9 Fick cardiac output/index = 5.6/2.6 PVR = 10.3 WU Ao sat = 94% PA sat = 65%, 68% High SVC sat = 65%   PFTs 3/14 FEV1 2.02 L (58%) FVC 2.7L (54%) FEV1/FVC (89%) DLCO 53%  PFTs 1/17 FEV1 2.05 L (61%) FVC 2.48L (56%) DLCO 44%  Ab u/s 8/16: + cirrhosis/mild to moderate splenomegaly  6 min walk 01/10/13, 1290 feet  (01/10/14) = 1280 feet (380 m) (9/15) = 1350 feet (411 m) O2 sats ranged from 89-95% on room air, HR ranged from 100-129. (6/16) = 384 meters  (2/17) = 1250 feet (347m) (8/17) = 1320 feet    Darren Mueller denies symptoms worrisome for COVID 19.   Past Medical History:  Diagnosis Date  . Alcohol dependence (HCC) 03/22/2011   In remission   . Alcohol  dependence in remission (HCC) 03/22/2011   In remission   . Alcoholic cirrhosis of liver with ascites (HCC) 2014  . Anginal pain (HCC)    with SOB admitted 04/20/2019  . Cardiomegaly 02/2019   Noted on CXR  . CHF (congestive heart failure) (HCC)   . CKD (chronic kidney disease), stage III (HCC) 07/18/2013  . COPD (chronic obstructive pulmonary disease) (HCC)   . Coronary artery disease   . Dermatitis 06/10/2015  . Diarrhea 09/04/2013  . Dyspnea    admitted 04/20/2019  . Elbow pain, left 03/17/2017  . Eustachian tube dysfunction   . GERD (gastroesophageal reflux disease)   . Gout   . Headache   . Hearing loss   . Heart murmur   . Hx of colonic polyps   . Hypertension   . Hypertriglyceridemia 03/17/2017  . Hypopotassemia   . Otalgia 11/28/2013  . Other chronic pulmonary heart diseases   . PAH (pulmonary artery hypertension) (HCC)    RV failure  . Pancytopenia (HCC)   . Pleural effusion 2014   Noted on CT:   Moderately large simple layering right pleural effusion   . Preventative health care 06/29/2016  . Pulmonary nodule 2014   Noted on CT: An 8 mm pulmonary nodule in Darren periphery of Darren left lower lobe  . Rectal bleeding   . Red eye 03/03/2016   right  . Seizures (HCC)   . Sleep apnea    does not wear CPAP  . Splenomegaly 12/2015   Noted on CT  . Thoracic aorta atherosclerosis (HCC) 12/2017  . Thrombocytopenia (HCC) 06/29/2016  . Unspecified hypothyroidism 01/25/2013  . Unspecified pleural effusion   . Vertigo   . Wears glasses    Past Surgical History:  Procedure Laterality Date  . CARDIAC CATHETERIZATION N/A 12/21/2015   Procedure: Right Heart Cath;  Surgeon: Dolores Patty, MD;  Location[facility] INVASIVE CV LAB;  Service: Cardiovascular;  Laterality: N/A;  . CARDIAC CATHETERIZATION N/A 04/15/2016   Procedure: Right/Left Heart Cath and Coronary Angiography;  Surgeon: Dolores Patty, MD;  Location: [facility] INVASIVE CV LAB;  Service: Cardiovascular;  Laterality: N/A;  .  COLONOSCOPY N/A 12/08/2013   Procedure: COLONOSCOPY;  Surgeon: Rachael Fee, MD;  Location: [facility] ENDOSCOPY;  Service: Endoscopy;  Laterality: N/A;  . COLONOSCOPY WITH PROPOFOL N/A 03/24/2019   Procedure: COLONOSCOPY WITH PROPOFOL;  Surgeon: Rachael Fee, MD;  Location: [facility] ENDOSCOPY;  Service: Endoscopy;  Laterality: N/A;  Draw CBC in preop  . COLONOSCOPY WITH PROPOFOL N/A 04/28/2019   Procedure: COLONOSCOPY WITH PROPOFOL;  Surgeon: Rachael Fee, MD;  Location: [facility] ENDOSCOPY;  Service: Endoscopy;  Laterality: N/A;  . ESOPHAGOGASTRODUODENOSCOPY (EGD) WITH PROPOFOL N/A 03/27/2016   Procedure: ESOPHAGOGASTRODUODENOSCOPY (EGD) WITH PROPOFOL;  Surgeon: Rachael Fee, MD;  Location: [facility] ENDOSCOPY;  Service: Endoscopy;  Laterality: N/A;  . ESOPHAGOGASTRODUODENOSCOPY (EGD) WITH PROPOFOL N/A 02/14/2019   Procedure: ESOPHAGOGASTRODUODENOSCOPY (EGD) WITH PROPOFOL;  Surgeon: Beverley Fiedler,  MD;  Location: [facility] ENDOSCOPY;  Service: Gastroenterology;  Laterality: N/A;  . EYE SURGERY Bilateral 03/15/2019   lasix  . HEMOSTASIS CLIP PLACEMENT  04/28/2019   Procedure: HEMOSTASIS CLIP PLACEMENT;  Surgeon: Rachael Fee, MD;  Location: [facility] ENDOSCOPY;  Service: Endoscopy;;  . LOOP RECORDER INSERTION N/A 01/01/2017   Procedure: Loop Recorder Insertion;  Surgeon: Hillis Range, MD;  Location: [facility] INVASIVE CV LAB;  Service: Cardiovascular;  Laterality: N/A;  . POLYPECTOMY  04/28/2019   Procedure: POLYPECTOMY;  Surgeon: Rachael Fee, MD;  Location: [facility] ENDOSCOPY;  Service: Endoscopy;;  . RIGHT HEART CATHETERIZATION Right 06/06/2014   Procedure: RIGHT HEART CATH;  Surgeon: Dolores Patty, MD;  Location: [facility] CATH LAB;  Service: Cardiovascular;  Laterality: Right;  . UVULOPALATOPHARYNGOPLASTY  1999     Current Outpatient Medications  Medication Sig Dispense Refill  . acetaminophen (TYLENOL) 500 MG tablet Take 1,000 mg by mouth every 6 (six) hours as needed (for pain.).    Marland Kitchen albuterol (VENTOLIN HFA) 108 (90 Base)  MCG/ACT inhaler Inhale 2 puffs into Darren lungs every 6 (six) hours as needed for wheezing or shortness of breath.    . allopurinol (ZYLOPRIM) 100 MG tablet Take 2 tablets (200 mg total) by mouth daily. 180 tablet 1  . Blood Pressure Monitoring (BLOOD PRESSURE CUFF) MISC Use as directed. Check blood pressure bid prn Dx:I10 1 each 0  . colchicine 0.6 MG tablet Take 0.6 mg by mouth daily as needed (gout flare).    . diphenoxylate-atropine (LOMOTIL) 2.5-0.025 MG tablet Take 1 tablet by mouth 3 (three) times daily. 60 tablet 0  . Ferrous Fumarate (HEMOCYTE) 324 (106 Fe) MG TABS tablet Take 1 tablet (106 mg of iron total) by mouth daily. 30 tablet 5  . fluticasone-salmeterol (ADVAIR HFA) 230-21 MCG/ACT inhaler Inhale 2 puffs into Darren lungs every morning. 48 g 1  . folic acid (FOLVITE) 1 MG tablet Take 1 tablet (1 mg total) by mouth daily. 30 tablet 5  . hydrOXYzine (ATARAX/VISTARIL) 25 MG tablet Take 1 tablet (25 mg total) by mouth 3 (three) times daily as needed. 30 tablet 0  . hyoscyamine (LEVSIN SL) 0.125 MG SL tablet Place 1 tablet (0.125 mg total) under Darren tongue every 6 (six) hours as needed. 30 tablet 1  . macitentan (OPSUMIT) 10 MG tablet Take 1 tablet (10 mg total) by mouth daily. 90 tablet 0  . Magnesium Oxide (MAG-OXIDE) 200 MG TABS Take 2 tablets (400 mg total) by mouth daily. 180 tablet 3  . metolazone (ZAROXOLYN) 2.5 MG tablet Take 1 tablet (2.5 mg total) by mouth as directed. As directed by heart failure team 30 tablet 0  . mometasone-formoterol (DULERA) 100-5 MCG/ACT AERO Inhale 2 puffs into Darren lungs 2 (two) times daily.    . Multiple Vitamin (MULTIVITAMIN) tablet Take 1 tablet by mouth daily.    Marland Kitchen omeprazole (PRILOSEC) 40 MG capsule Take 40 mg by mouth daily.    . potassium chloride (KLOR-CON) 10 MEQ tablet Take 6 tablets (60 mEq total) by mouth 2 (two) times daily. 360 tablet 3  . rosuvastatin (CRESTOR) 5 MG tablet TAKE 1 TABLET DAILY 90 tablet 3  . Selexipag (UPTRAVI) 1000 MCG TABS  Take 1,000 mcg by mouth 2 (two) times daily. 60 tablet 11  . tadalafil, PAH, (ADCIRCA) 20 MG tablet Take 2 tablets (40 mg total) by mouth daily. 90 tablet 1  . thiamine 100 MG tablet Take 1 tablet (100 mg total) by mouth daily. 30 tablet 5  .  torsemide (DEMADEX) 20 MG tablet Take 3 tablets (60 mg total) by mouth 2 (two) times daily. 540 tablet 3   No current facility-administered medications for this encounter.     Allergies:   Aspirin and Nitroglycerin   Social History:  Darren patient  reports that he quit smoking about 39 years ago. His smoking use included cigarettes. He has a 10.00 pack-year smoking history. He has never used smokeless tobacco. He reports current alcohol use of about 6.0 standard drinks of alcohol per week. He reports that he does not use drugs.   Family History:  Darren patient's family history includes Alcoholism in his maternal grandfather; Cancer in his father; Gout in his brother; Heart attack in his mother; Heart disease in his mother; Hypertension in his brother, brother, and mother; Kidney failure in his mother; Liver disease in his maternal grandfather and mother; Migraines in his sister; Prostate cancer in his father.   ROS:  Please see Darren history of present illness.   All other systems are personally reviewed and negative.   Exam:  (Video/Tele Health Call; Exam is subjective and or/visual.) General:  Speaks in full sentences. No resp difficulty. Lungs: Normal respiratory effort with conversation.  Abdomen: distended per patient report Extremities: pt endorses edema. Wearing hose Neuro: Alert & oriented x 3.   Recent Labs: 05/16/2019: B Natriuretic Peptide 1,119.2; Pro B Natriuretic peptide (BNP) 1,293.0 06/02/2019: TSH 2.50 07/12/2019: ALT 18; Hemoglobin 10.7; Platelets 31.0 Repeated and verified X2. 07/20/2019: Magnesium 1.7 09/01/2019: BUN 58; Creatinine, Ser 2.38; Potassium 2.8; Sodium 136  Personally reviewed   Wt Readings from Last 3 Encounters:  08/23/19  94.5 kg (208 lb 6.4 oz)  07/12/19 95.8 kg (211 lb 3.2 oz)  06/29/19 93.9 kg (207 lb 1.6 oz)      ASSESSMENT AND PLAN:  1. PAH: Suspected portopulmonary HTN in setting of ETOH cirrhosis. - Chronic NYHA III. Somewhat improved with volume removal  - 9/19 1280 feet (390 meters) - Echo 9/19 with EF 60-65% RV markedly dilated and hypokinetic with D-shaped septum - Echo  05/17/19: LVEF > 65% RV severely dilated with moderate RV dysfunction. Septal flattening Moderate TR RVSP . - Continue selexipag 1000 mcg twice a day. Intolerant higher dose due to diarrhea and headaches .  - Continue macitentan 10 daily and adcirca 40 mg daily. - With RV strain on echo may need to consider repeat RHC and  IV therapies in Darren near future. - No change    2. Acute on chronic diastolic HF - Volume status improving but still not at baseline (baseline weight likely closer to 190-195). He is 203 but further diuresis limited by AKI - Restart torsemide 60/40 tomorrow - change KCL to 40 bid - Hold metolazone for now - Repeat labs Monday - f/u televist 3 weeks  3. Acute CKD III:  -Baseline creatinine 1.7-2.0. Was 2.3 on 07/20/19. - Creatinine up to 2.7 on 10/26 now back to 2.38 - Followed by Dr. Arrie Aran  - Diuretics adjusted. Recheck Monday  4. Hypokalemia - take 60 kcl this afternoon. Then drop back to 40 bid - BMET Monday  5. Cirrhosis:  - Likely combination of RV failure and ETOH - Follows with Dr. Christella Hartigan. - Did not tolerate nadolol.  - EGD 4/20 stable - Stressed need for ETOH cessation  6. Recurrent syncope/seizure - LINQ interrogations have shown no arrhythmias and thus most likely seizure - Continue Keppra. Follows with Dr. Karel Jarvis  7. HTN:  -.Blood pressure well controlled. Continue current regimen.  8. OSA:  - Has mild OSA with AHI 12 and desats down to 82%.  - Intolerant CPAP.   9. Pancytopenia:  - Has seen Dr. Clelia Croft in Hematology - felt like it is due to splenic  sequestration.  - No role for BMBx current per Dr. Clelia Croft   10. Hypomag - continue magOx 400 daily  COVID screen Darren patient does not have any symptoms that suggest any further testing/ screening at this time. Social distancing reinforced today.  Patient Risk: After full review of this patients clinical status, I feel that they are at moderate risk for cardiac decompensation at this time.  Relevant cardiac medications were reviewed at length with Darren patient today. Darren patient does not have concerns regarding their medications at this time.   Recommended follow-up:3 week televisit   Today, I have spent14 minutes with Darren patient with telehealth technology discussing Darren above issues .   Signed, [name], MD  09/01/2019 4:47 PM  [facility] [address] Heart and Vascular [place] [address] [phone] (office) [phone] (fax)

## 2019-09-02 NOTE — Progress Notes (Signed)
[facility] Summary Report / Loop Recorder

## 2019-09-02 NOTE — Progress Notes (Signed)
Attempting to contact patient to review AVS instructions x 2 Both times no answer and unable to leave message as voicemail is full Will attempt later

## 2019-09-02 NOTE — Addendum Note (Signed)
Encounter addended by: Kerry Dory, CMA on: 09/02/2019 2:12 PM  Actions taken: Clinical Note Signed

## 2019-09-05 ENCOUNTER — Other Ambulatory Visit: Payer: Self-pay

## 2019-09-05 ENCOUNTER — Ambulatory Visit (HOSPITAL_COMMUNITY)
Admission: RE | Admit: 2019-09-05 | Discharge: 2019-09-05 | Disposition: A | Discharge status: Home / Self Care | Payer: Medicare Other | Source: Ambulatory Visit | Attending: Internal Medicine | Admitting: Internal Medicine

## 2019-09-05 DIAGNOSIS — I5022 Chronic systolic (congestive) heart failure: Secondary | ICD-10-CM | POA: Insufficient documentation

## 2019-09-05 LAB — BASIC METABOLIC PANEL
Anion gap: 9 (ref 5–15)
BUN: 40 mg/dL — ABNORMAL HIGH (ref 8–23)
CO2: 22 mmol/L (ref 22–32)
Calcium: 8.4 mg/dL — ABNORMAL LOW (ref 8.9–10.3)
Chloride: 106 mmol/L (ref 98–111)
Creatinine, Ser: 1.95 mg/dL — ABNORMAL HIGH (ref 0.61–1.24)
GFR calc Af Amer: 40 mL/min — ABNORMAL LOW (ref >60)
GFR calc non Af Amer: 34 mL/min — ABNORMAL LOW (ref >60)
Glucose, Bld: 96 mg/dL (ref 70–99)
Potassium: 3 mmol/L — ABNORMAL LOW (ref 3.5–5.1)
Sodium: 137 mmol/L (ref 135–145)

## 2019-09-06 ENCOUNTER — Encounter (HOSPITAL_COMMUNITY): Payer: Self-pay | Admitting: *Deleted

## 2019-09-06 ENCOUNTER — Other Ambulatory Visit (HOSPITAL_COMMUNITY): Payer: Medicare Other

## 2019-09-12 ENCOUNTER — Other Ambulatory Visit: Payer: Self-pay

## 2019-09-12 ENCOUNTER — Ambulatory Visit (HOSPITAL_COMMUNITY)
Admission: RE | Admit: 2019-09-12 | Discharge: 2019-09-12 | Disposition: A | Discharge status: Home / Self Care | Payer: Medicare Other | Source: Ambulatory Visit | Attending: Cardiology | Admitting: Cardiology

## 2019-09-12 DIAGNOSIS — I5032 Chronic diastolic (congestive) heart failure: Secondary | ICD-10-CM | POA: Insufficient documentation

## 2019-09-12 LAB — BASIC METABOLIC PANEL
Anion gap: 9 (ref 5–15)
BUN: 35 mg/dL — ABNORMAL HIGH (ref 8–23)
CO2: 19 mmol/L — ABNORMAL LOW (ref 22–32)
Calcium: 8.3 mg/dL — ABNORMAL LOW (ref 8.9–10.3)
Chloride: 111 mmol/L (ref 98–111)
Creatinine, Ser: 1.73 mg/dL — ABNORMAL HIGH (ref 0.61–1.24)
GFR calc Af Amer: 46 mL/min — ABNORMAL LOW (ref >60)
GFR calc non Af Amer: 39 mL/min — ABNORMAL LOW (ref >60)
Glucose, Bld: 89 mg/dL (ref 70–99)
Potassium: 3.7 mmol/L (ref 3.5–5.1)
Sodium: 139 mmol/L (ref 135–145)

## 2019-09-19 MED FILL — TADALAFIL (PAH) 20 MG TABS: 20 | 30 days supply | Qty: 60 | Fill #1

## 2019-09-21 ENCOUNTER — Telehealth (HOSPITAL_COMMUNITY): Payer: Self-pay | Admitting: *Deleted

## 2019-09-21 DIAGNOSIS — Q141 Congenital malformation of retina: Secondary | ICD-10-CM | POA: Diagnosis not present

## 2019-09-21 DIAGNOSIS — H33011 Retinal detachment with single break, right eye: Secondary | ICD-10-CM | POA: Diagnosis not present

## 2019-09-21 DIAGNOSIS — H43811 Vitreous degeneration, right eye: Secondary | ICD-10-CM | POA: Diagnosis not present

## 2019-09-21 MED FILL — OFLOXACIN 0.3% EYE DROPS: 0.3 | 25 days supply | Qty: 5 | Fill #0

## 2019-09-21 MED FILL — DUREZOL 0.05% EYE DROPS: 0.05 | 50 days supply | Qty: 5 | Fill #0

## 2019-09-21 NOTE — Telephone Encounter (Signed)
Received call from [facility], they also sent an urgent fax.  Pt has a detached retina in R eye and needs emergency surgery which is sch for tomorrow 11/19 and want clearance from Dr Haroldine Laws.  Per Dr Haroldine Laws:  "Patient w/moderate risk of cardio-pulmonary complications w/procedures but given nature of procedure would proceed w/out further cardiac work up."  They are aware and note faxed back to them at [phone]

## 2019-09-22 ENCOUNTER — Encounter (HOSPITAL_COMMUNITY): Payer: Self-pay | Admitting: *Deleted

## 2019-09-22 ENCOUNTER — Encounter (HOSPITAL_COMMUNITY)
Admission: RE | Disposition: A | Discharge status: Home / Self Care | Payer: Self-pay | Source: Ambulatory Visit | Attending: Ophthalmology

## 2019-09-22 ENCOUNTER — Inpatient Hospital Stay (HOSPITAL_COMMUNITY): Payer: Medicare Other | Admitting: Anesthesiology

## 2019-09-22 ENCOUNTER — Other Ambulatory Visit: Payer: Self-pay

## 2019-09-22 ENCOUNTER — Other Ambulatory Visit: Payer: Self-pay | Admitting: Ophthalmology

## 2019-09-22 ENCOUNTER — Ambulatory Visit (HOSPITAL_COMMUNITY)
Admission: RE | Admit: 2019-09-22 | Discharge: 2019-09-22 | Disposition: A | Discharge status: Home / Self Care | Payer: Medicare Other | Source: Ambulatory Visit | Attending: Ophthalmology | Admitting: Ophthalmology

## 2019-09-22 DIAGNOSIS — J449 Chronic obstructive pulmonary disease, unspecified: Secondary | ICD-10-CM | POA: Diagnosis not present

## 2019-09-22 DIAGNOSIS — Z8249 Family history of ischemic heart disease and other diseases of the circulatory system: Secondary | ICD-10-CM | POA: Diagnosis not present

## 2019-09-22 DIAGNOSIS — G4733 Obstructive sleep apnea (adult) (pediatric): Secondary | ICD-10-CM | POA: Insufficient documentation

## 2019-09-22 DIAGNOSIS — Z7951 Long term (current) use of inhaled steroids: Secondary | ICD-10-CM | POA: Insufficient documentation

## 2019-09-22 DIAGNOSIS — K219 Gastro-esophageal reflux disease without esophagitis: Secondary | ICD-10-CM | POA: Insufficient documentation

## 2019-09-22 DIAGNOSIS — Z79899 Other long term (current) drug therapy: Secondary | ICD-10-CM | POA: Diagnosis not present

## 2019-09-22 DIAGNOSIS — I5032 Chronic diastolic (congestive) heart failure: Secondary | ICD-10-CM | POA: Insufficient documentation

## 2019-09-22 DIAGNOSIS — Z20828 Contact with and (suspected) exposure to other viral communicable diseases: Secondary | ICD-10-CM | POA: Insufficient documentation

## 2019-09-22 DIAGNOSIS — Z886 Allergy status to analgesic agent status: Secondary | ICD-10-CM | POA: Diagnosis not present

## 2019-09-22 DIAGNOSIS — M109 Gout, unspecified: Secondary | ICD-10-CM | POA: Diagnosis not present

## 2019-09-22 DIAGNOSIS — H3321 Serous retinal detachment, right eye: Secondary | ICD-10-CM | POA: Diagnosis not present

## 2019-09-22 DIAGNOSIS — Z87891 Personal history of nicotine dependence: Secondary | ICD-10-CM | POA: Insufficient documentation

## 2019-09-22 DIAGNOSIS — E781 Pure hyperglyceridemia: Secondary | ICD-10-CM | POA: Diagnosis not present

## 2019-09-22 DIAGNOSIS — H33021 Retinal detachment with multiple breaks, right eye: Secondary | ICD-10-CM

## 2019-09-22 DIAGNOSIS — I5033 Acute on chronic diastolic (congestive) heart failure: Secondary | ICD-10-CM | POA: Diagnosis not present

## 2019-09-22 DIAGNOSIS — Z888 Allergy status to other drugs, medicaments and biological substances status: Secondary | ICD-10-CM | POA: Insufficient documentation

## 2019-09-22 DIAGNOSIS — N183 Chronic kidney disease, stage 3 unspecified: Secondary | ICD-10-CM | POA: Insufficient documentation

## 2019-09-22 DIAGNOSIS — G40909 Epilepsy, unspecified, not intractable, without status epilepticus: Secondary | ICD-10-CM | POA: Diagnosis not present

## 2019-09-22 DIAGNOSIS — I130 Hypertensive heart and chronic kidney disease with heart failure and stage 1 through stage 4 chronic kidney disease, or unspecified chronic kidney disease: Secondary | ICD-10-CM | POA: Diagnosis not present

## 2019-09-22 DIAGNOSIS — I2721 Secondary pulmonary arterial hypertension: Secondary | ICD-10-CM | POA: Diagnosis not present

## 2019-09-22 DIAGNOSIS — I2510 Atherosclerotic heart disease of native coronary artery without angina pectoris: Secondary | ICD-10-CM | POA: Insufficient documentation

## 2019-09-22 DIAGNOSIS — H33011 Retinal detachment with single break, right eye: Secondary | ICD-10-CM | POA: Diagnosis not present

## 2019-09-22 HISTORY — PX: PHOTOCOAGULATION WITH LASER: SHX6027

## 2019-09-22 HISTORY — PX: PARS PLANA VITRECTOMY: SHX2166

## 2019-09-22 HISTORY — PX: GAS/FLUID EXCHANGE: SHX5334

## 2019-09-22 LAB — POCT I-STAT, CHEM 8
BUN: 29 mg/dL — ABNORMAL HIGH (ref 8–23)
Calcium, Ion: 1.16 mmol/L (ref 1.15–1.40)
Chloride: 111 mmol/L (ref 98–111)
Creatinine, Ser: 1.6 mg/dL — ABNORMAL HIGH (ref 0.61–1.24)
Glucose, Bld: 67 mg/dL — ABNORMAL LOW (ref 70–99)
HCT: 33 % — ABNORMAL LOW (ref 39.0–52.0)
Hemoglobin: 11.2 g/dL — ABNORMAL LOW (ref 13.0–17.0)
Potassium: 4 mmol/L (ref 3.5–5.1)
Sodium: 144 mmol/L (ref 135–145)
TCO2: 21 mmol/L — ABNORMAL LOW (ref 22–32)

## 2019-09-22 LAB — CBC
HCT: 33.5 % — ABNORMAL LOW (ref 39.0–52.0)
Hemoglobin: 10.3 g/dL — ABNORMAL LOW (ref 13.0–17.0)
MCH: 35 pg — ABNORMAL HIGH (ref 26.0–34.0)
MCHC: 30.7 g/dL (ref 30.0–36.0)
MCV: 113.9 fL — ABNORMAL HIGH (ref 80.0–100.0)
Platelets: 27 10*3/uL — CL (ref 150–400)
RBC: 2.94 MIL/uL — ABNORMAL LOW (ref 4.22–5.81)
RDW: 18.7 % — ABNORMAL HIGH (ref 11.5–15.5)
WBC: 1.5 10*3/uL — ABNORMAL LOW (ref 4.0–10.5)
nRBC: 0 % (ref 0.0–0.2)

## 2019-09-22 LAB — SARS CORONAVIRUS 2 BY RT PCR (HOSPITAL ORDER, PERFORMED IN ~~LOC~~ HOSPITAL LAB): SARS Coronavirus 2: NEGATIVE

## 2019-09-22 SURGERY — PARS PLANA VITRECTOMY WITH 25 GAUGE
Anesthesia: Monitor Anesthesia Care | Site: Eye | Laterality: Right

## 2019-09-22 MED ORDER — TOBRAMYCIN-DEXAMETHASONE 0.3-0.1 % OP OINT
TOPICAL_OINTMENT | OPHTHALMIC | Status: AC
  Filled 2019-09-22: qty 3.5

## 2019-09-22 MED ORDER — BALANCED SALT IO SOLN
INTRAOCULAR | Status: DC | PRN
  Administered 2019-09-22: 15 mL via INTRAOCULAR

## 2019-09-22 MED ORDER — BSS PLUS IO SOLN
INTRAOCULAR | Status: AC
  Filled 2019-09-22: qty 500

## 2019-09-22 MED ORDER — LIDOCAINE HCL (CARDIAC) PF 100 MG/5ML IV SOSY
PREFILLED_SYRINGE | INTRAVENOUS | Status: DC | PRN
  Administered 2019-09-22: 100 mg via INTRATRACHEAL

## 2019-09-22 MED ORDER — PHENYLEPHRINE HCL 2.5 % OP SOLN
OPHTHALMIC | Status: AC
  Administered 2019-09-22: 18:00:00 1 [drp] via OPHTHALMIC
  Filled 2019-09-22: qty 2

## 2019-09-22 MED ORDER — ATROPINE SULFATE 1 % OP SOLN
1.0000 [drp] | OPHTHALMIC | Status: DC | PRN
  Administered 2019-09-22 (×3): 1 [drp] via OPHTHALMIC

## 2019-09-22 MED ORDER — ATROPINE SULFATE 1 % OP SOLN
OPHTHALMIC | Status: AC
  Filled 2019-09-22: qty 5

## 2019-09-22 MED ORDER — STERILE WATER FOR IRRIGATION IR SOLN
Status: DC | PRN
  Administered 2019-09-22: 1000 mL

## 2019-09-22 MED ORDER — DEXAMETHASONE SODIUM PHOSPHATE 10 MG/ML IJ SOLN
INTRAMUSCULAR | Status: AC
  Filled 2019-09-22: qty 1

## 2019-09-22 MED ORDER — PHENYLEPHRINE HCL 2.5 % OP SOLN
1.0000 [drp] | OPHTHALMIC | Status: DC | PRN
  Administered 2019-09-22 (×3): 1 [drp] via OPHTHALMIC

## 2019-09-22 MED ORDER — PROPOFOL 10 MG/ML IV BOLUS
INTRAVENOUS | Status: DC | PRN
  Administered 2019-09-22: 50 mg via INTRAVENOUS

## 2019-09-22 MED ORDER — BSS IO SOLN
INTRAOCULAR | Status: AC
  Filled 2019-09-22: qty 15

## 2019-09-22 MED ORDER — LIDOCAINE HCL 2 % IJ SOLN
INTRAMUSCULAR | Status: AC
  Filled 2019-09-22: qty 20

## 2019-09-22 MED ORDER — EPINEPHRINE PF 1 MG/ML IJ SOLN
INTRAMUSCULAR | Status: AC
  Filled 2019-09-22: qty 1

## 2019-09-22 MED ORDER — LIDOCAINE HCL 2 % IJ SOLN
INTRAMUSCULAR | Status: DC | PRN
  Administered 2019-09-22: 7 mL via RETROBULBAR

## 2019-09-22 MED ORDER — EPINEPHRINE PF 1 MG/ML IJ SOLN
INTRAOCULAR | Status: DC | PRN
  Administered 2019-09-22: 500 mL

## 2019-09-22 MED ORDER — 0.9 % SODIUM CHLORIDE (POUR BTL) OPTIME
TOPICAL | Status: DC | PRN
  Administered 2019-09-22: 1000 mL

## 2019-09-22 MED ORDER — BUPIVACAINE HCL (PF) 0.75 % IJ SOLN
INTRAMUSCULAR | Status: AC
  Filled 2019-09-22: qty 10

## 2019-09-22 MED ORDER — CEFAZOLIN SUBCONJUNCTIVAL INJECTION 100 MG/0.5 ML
100.0000 mg | INJECTION | Freq: Once | SUBCONJUNCTIVAL | Status: AC
  Administered 2019-09-22: 100 mg via SUBCONJUNCTIVAL
  Filled 2019-09-22: qty 1

## 2019-09-22 MED ORDER — MIDAZOLAM HCL 2 MG/2ML IJ SOLN
INTRAMUSCULAR | Status: DC | PRN
  Administered 2019-09-22: 2 mg via INTRAVENOUS

## 2019-09-22 MED ORDER — TETRACAINE HCL 0.5 % OP SOLN
OPHTHALMIC | Status: AC
  Filled 2019-09-22: qty 4

## 2019-09-22 MED ORDER — HYPROMELLOSE (GONIOSCOPIC) 2.5 % OP SOLN
OPHTHALMIC | Status: DC | PRN
  Administered 2019-09-22: 2 [drp] via OPHTHALMIC

## 2019-09-22 MED ORDER — ATROPINE SULFATE 1 % OP SOLN
OPHTHALMIC | Status: AC
  Administered 2019-09-22: 1 [drp] via OPHTHALMIC
  Filled 2019-09-22: qty 5

## 2019-09-22 MED ORDER — MIDAZOLAM HCL 2 MG/2ML IJ SOLN
INTRAMUSCULAR | Status: AC
  Filled 2019-09-22: qty 2

## 2019-09-22 MED ORDER — TOBRAMYCIN-DEXAMETHASONE 0.3-0.1 % OP OINT
TOPICAL_OINTMENT | OPHTHALMIC | Status: DC | PRN
  Administered 2019-09-22: 1 via OPHTHALMIC

## 2019-09-22 MED ORDER — HYALURONIDASE HUMAN 150 UNIT/ML IJ SOLN
INTRAMUSCULAR | Status: AC
  Filled 2019-09-22: qty 1

## 2019-09-22 MED ORDER — HYPROMELLOSE (GONIOSCOPIC) 2.5 % OP SOLN
OPHTHALMIC | Status: AC
  Filled 2019-09-22: qty 15

## 2019-09-22 MED ORDER — SODIUM CHLORIDE 0.9 % IV SOLN
INTRAVENOUS | Status: DC
  Administered 2019-09-22: 19:00:00 via INTRAVENOUS

## 2019-09-22 MED ORDER — PROPOFOL 10 MG/ML IV BOLUS
INTRAVENOUS | Status: AC
  Filled 2019-09-22: qty 20

## 2019-09-22 MED ORDER — DEXAMETHASONE SODIUM PHOSPHATE 10 MG/ML IJ SOLN
INTRAMUSCULAR | Status: DC | PRN
  Administered 2019-09-22: 10 mg

## 2019-09-22 SURGICAL SUPPLY — 48 items
APPLICATOR DR MATTHEWS STRL (MISCELLANEOUS) ×3 IMPLANT
BAND WRIST GAS GREEN (MISCELLANEOUS) ×1 IMPLANT
BNDG EYE OVAL (GAUZE/BANDAGES/DRESSINGS) ×6 IMPLANT
CABLE BIPOLOR RESECTION CORD (MISCELLANEOUS) ×3 IMPLANT
CANNULA VLV SOFT TIP 25GA (OPHTHALMIC) ×6 IMPLANT
CAUTERY EYE LOW TEMP 1300F FIN (OPHTHALMIC RELATED) IMPLANT
CLOSURE STERI-STRIP 1/2X4 (GAUZE/BANDAGES/DRESSINGS) ×1
CLSR STERI-STRIP ANTIMIC 1/2X4 (GAUZE/BANDAGES/DRESSINGS) ×2 IMPLANT
CORD BIPOLAR FORCEPS 12FT (ELECTRODE) ×3 IMPLANT
COVER WAND RF STERILE (DRAPES) IMPLANT
DRAPE HALF SHEET 40X57 (DRAPES) IMPLANT
DRAPE RETRACTOR (MISCELLANEOUS) ×3 IMPLANT
GAS AUTO FILL CONSTEL (OPHTHALMIC) ×3
GAS AUTO FILL CONSTELLATION (OPHTHALMIC) ×1 IMPLANT
GAS WRIST BAND GREEN (MISCELLANEOUS) ×2
GLOVE BIO SURGEON STRL SZ7.5 (GLOVE) ×6 IMPLANT
GOWN STRL REUS W/ TWL LRG LVL3 (GOWN DISPOSABLE) ×2 IMPLANT
GOWN STRL REUS W/TWL LRG LVL3 (GOWN DISPOSABLE) ×4
KIT BASIN OR (CUSTOM PROCEDURE TRAY) ×3 IMPLANT
KIT TURNOVER KIT B (KITS) ×3 IMPLANT
LENS BIOM SUPER VIEW SET DISP (MISCELLANEOUS) ×3 IMPLANT
NEEDLE 18GX1X1/2 (RX/OR ONLY) (NEEDLE) ×3 IMPLANT
NEEDLE 25GX 5/8IN NON SAFETY (NEEDLE) ×3 IMPLANT
NEEDLE FILTER BLUNT 18X 1/2SAF (NEEDLE) ×2
NEEDLE FILTER BLUNT 18X1 1/2 (NEEDLE) ×1 IMPLANT
NEEDLE HYPO 30X.5 LL (NEEDLE) ×6 IMPLANT
NEEDLE RETROBULBAR 25GX1.5 (NEEDLE) ×3 IMPLANT
NS IRRIG 1000ML POUR BTL (IV SOLUTION) ×3 IMPLANT
PACK VITRECTOMY CUSTOM (CUSTOM PROCEDURE TRAY) ×3 IMPLANT
PAD ARMBOARD 7.5X6 YLW CONV (MISCELLANEOUS) ×3 IMPLANT
PAK PIK VITRECTOMY CVS 25GA (OPHTHALMIC) ×6 IMPLANT
PENCIL BIPOLAR 25GA STR DISP (OPHTHALMIC RELATED) ×3 IMPLANT
PROBE LASER ILLUM FLEX CVD 25G (OPHTHALMIC) ×3 IMPLANT
STOCKINETTE IMPERVIOUS 9X36 MD (GAUZE/BANDAGES/DRESSINGS) ×6 IMPLANT
STOCKINETTE IMPERVIOUS LG (DRAPES) ×6 IMPLANT
STOPCOCK 4 WAY LG BORE MALE ST (IV SETS) IMPLANT
SUT ETHILON 8 0 TG100 8 (SUTURE) IMPLANT
SUT VICRYL 7 0 TG140 8 (SUTURE) IMPLANT
SUT VICRYL 8 0 TG140 8 (SUTURE) IMPLANT
SUT VICRYL ABS 6-0 S29 18IN (SUTURE) IMPLANT
SYR 10ML LL (SYRINGE) IMPLANT
SYR 20ML LL LF (SYRINGE) ×3 IMPLANT
SYR 5ML LL (SYRINGE) ×3 IMPLANT
SYR TB 1ML LUER SLIP (SYRINGE) IMPLANT
TOWEL GREEN STERILE FF (TOWEL DISPOSABLE) ×3 IMPLANT
TUBE CONNECTING 12'X1/4 (SUCTIONS)
TUBE CONNECTING 12X1/4 (SUCTIONS) IMPLANT
WATER STERILE IRR 1000ML POUR (IV SOLUTION) ×3 IMPLANT

## 2019-09-22 NOTE — Progress Notes (Signed)
Received critical platelet level of 27 from lab. Informed anesthesiologist Dr. Ola Spurr. Will continue to monitor patient.

## 2019-09-22 NOTE — OR Nursing (Signed)
1931: One drop of 1% Atropine ophthalmic solution to patient's RIGHT eye from medications bought in from Short Stay, per Dr. Baird Cancer. Expiration date: 12/18/2021. 1938: [place] bracelet applied to patient's Right wrist prior to going to Post Anesthesia Care Unit. Instructions reviewed with patient. Patient verbalized understanding. Discharge instructions reiterated by Dr. Baird Cancer in PACU.

## 2019-09-22 NOTE — H&P (Addendum)
Darren Mueller is an 68 y.o. male.   Chief Complaint: vision loss OD HPI: dx w Rd OD  Past Medical History:  Diagnosis Date  . Alcohol dependence ([place]) 03/22/2011   In remission   . Alcohol dependence in remission ([place]) 03/22/2011   In remission   . Alcoholic cirrhosis of liver with ascites ([place]) 2014  . Anginal pain ([place])    with SOB admitted 04/20/2019  . Cardiomegaly 02/2019   Noted on CXR  . CHF (congestive heart failure) ([place])   . CKD (chronic kidney disease), stage III 07/18/2013  . COPD (chronic obstructive pulmonary disease) ([place])   . Coronary artery disease   . Dermatitis 06/10/2015  . Diarrhea 09/04/2013  . Dyspnea    admitted 04/20/2019  . Elbow pain, left 03/17/2017  . Eustachian tube dysfunction   . GERD (gastroesophageal reflux disease)   . Gout   . Headache   . Hearing loss   . Heart murmur   . Hx of colonic polyps   . Hypertension   . Hypertriglyceridemia 03/17/2017  . Hypopotassemia   . Otalgia 11/28/2013  . Other chronic pulmonary heart diseases   . PAH (pulmonary artery hypertension) ([facility])    RV failure  . Pancytopenia ([place])   . Pleural effusion 2014   Noted on CT:   Moderately large simple layering right pleural effusion   . Preventative health care 06/29/2016  . Pulmonary nodule 2014   Noted on CT: An 8 mm pulmonary nodule in the periphery of the left lower lobe  . Rectal bleeding   . Red eye 03/03/2016   right  . Seizures ([place])   . Sleep apnea    does not wear CPAP  . Splenomegaly 12/2015   Noted on CT  . Thoracic aorta atherosclerosis ([place]) 12/2017  . Thrombocytopenia ([place]) 06/29/2016  . Unspecified hypothyroidism 01/25/2013  . Unspecified pleural effusion   . Vertigo   . Wears glasses     Past Surgical History:  Procedure Laterality Date  . CARDIAC CATHETERIZATION N/A 12/21/2015   Procedure: Right Heart Cath;  Surgeon: Jolaine Artist, MD;  Location: [facility];  Service: Cardiovascular;  Laterality: N/A;  . CARDIAC CATHETERIZATION  N/A 04/15/2016   Procedure: Right/Left Heart Cath and Coronary Angiography;  Surgeon: Jolaine Artist, MD;  Location: [facility];  Service: Cardiovascular;  Laterality: N/A;  . COLONOSCOPY N/A 12/08/2013   Procedure: COLONOSCOPY;  Surgeon: Milus Banister, MD;  Location: [facility];  Service: Endoscopy;  Laterality: N/A;  . COLONOSCOPY WITH PROPOFOL N/A 03/24/2019   Procedure: COLONOSCOPY WITH PROPOFOL;  Surgeon: Milus Banister, MD;  Location: [facility];  Service: Endoscopy;  Laterality: N/A;  Draw CBC in preop  . COLONOSCOPY WITH PROPOFOL N/A 04/28/2019   Procedure: COLONOSCOPY WITH PROPOFOL;  Surgeon: Milus Banister, MD;  Location: [facility];  Service: Endoscopy;  Laterality: N/A;  . ESOPHAGOGASTRODUODENOSCOPY (EGD) WITH PROPOFOL N/A 03/27/2016   Procedure: ESOPHAGOGASTRODUODENOSCOPY (EGD) WITH PROPOFOL;  Surgeon: Milus Banister, MD;  Location: [facility];  Service: Endoscopy;  Laterality: N/A;  . ESOPHAGOGASTRODUODENOSCOPY (EGD) WITH PROPOFOL N/A 02/14/2019   Procedure: ESOPHAGOGASTRODUODENOSCOPY (EGD) WITH PROPOFOL;  Surgeon: Jerene Bears, MD;  Location: [facility] ENDOSCOPY;  Service: Gastroenterology;  Laterality: N/A;  . EYE SURGERY Bilateral 03/15/2019   lasix  . HEMOSTASIS CLIP PLACEMENT  04/28/2019   Procedure: HEMOSTASIS CLIP PLACEMENT;  Surgeon: Milus Banister, MD;  Location: [facility];  Service: Endoscopy;;  . LOOP RECORDER INSERTION N/A 01/01/2017  Procedure: Loop Recorder Insertion;  Surgeon: Thompson Grayer, MD;  Location: [facility];  Service: Cardiovascular;  Laterality: N/A;  . POLYPECTOMY  04/28/2019   Procedure: POLYPECTOMY;  Surgeon: Milus Banister, MD;  Location: [facility];  Service: Endoscopy;;  . RIGHT HEART CATHETERIZATION Right 06/06/2014   Procedure: RIGHT HEART CATH;  Surgeon: Jolaine Artist, MD;  Location: [facility] C[facility] LAB;  Service: Cardiovascular;  Laterality: Right;  . UVULOPALATOPHARYNGOPLASTY  1999    Family History  Problem Relation  Age of Onset  . Heart disease Mother   . Heart attack Mother   . Hypertension Mother   . Kidney failure Mother   . Liver disease Mother        stage 4 liver disease  . Prostate cancer Father   . Cancer Father        lung  . Hypertension Brother   . Gout Brother   . Alcoholism Maternal Grandfather   . Liver disease Maternal Grandfather   . Migraines Sister   . Hypertension Brother   . Colon cancer Neg Hx    Social History:  reports that he quit smoking about 40 years ago. His smoking use included cigarettes. He has a 10.00 pack-year smoking history. He has never used smokeless tobacco. He reports current alcohol use of about 6.0 standard drinks of alcohol per week. He reports that he does not use drugs.  Allergies:  Allergies  Allergen Reactions  . Aspirin Hypertension  . Nitroglycerin Other (See Comments)    Headache    Medications Prior to Admission  Medication Sig Dispense Refill  . acetaminophen (TYLENOL) 500 MG tablet Take 1,000 mg by mouth every 6 (six) hours as needed (for pain.).    Marland Kitchen albuterol (VENTOLIN HFA) 108 (90 Base) MCG/ACT inhaler Inhale 2 puffs into the lungs every 6 (six) hours as needed for wheezing or shortness of breath.    . allopurinol (ZYLOPRIM) 100 MG tablet Take 2 tablets (200 mg total) by mouth daily. 180 tablet 1  . colchicine 0.6 MG tablet Take 0.6 mg by mouth daily as needed (gout flare).    . diphenoxylate-atropine (LOMOTIL) 2.5-0.025 MG tablet Take 1 tablet by mouth 3 (three) times daily. 60 tablet 0  . Ferrous Fumarate (HEMOCYTE) 324 (106 Fe) MG TABS tablet Take 1 tablet (106 mg of iron total) by mouth daily. 30 tablet 5  . fluticasone-salmeterol (ADVAIR HFA) 230-21 MCG/ACT inhaler Inhale 2 puffs into the lungs every morning. 48 g 1  . folic acid (FOLVITE) 1 MG tablet Take 1 tablet (1 mg total) by mouth daily. 30 tablet 5  . hydrOXYzine (ATARAX/VISTARIL) 25 MG tablet Take 1 tablet (25 mg total) by mouth 3 (three) times daily as needed. 30 tablet 0   . hyoscyamine (LEVSIN SL) 0.125 MG SL tablet Place 1 tablet (0.125 mg total) under the tongue every 6 (six) hours as needed. 30 tablet 1  . macitentan (OPSUMIT) 10 MG tablet Take 1 tablet (10 mg total) by mouth daily. 90 tablet 0  . Magnesium Oxide (MAG-OXIDE) 200 MG TABS Take 2 tablets (400 mg total) by mouth daily. 180 tablet 3  . metolazone (ZAROXOLYN) 2.5 MG tablet Take 1 tablet (2.5 mg total) by mouth as directed. As directed by heart failure team 30 tablet 0  . mometasone-formoterol (DULERA) 100-5 MCG/ACT AERO Inhale 2 puffs into the lungs 2 (two) times daily.    . Multiple Vitamin (MULTIVITAMIN) tablet Take 1 tablet by mouth daily.    Marland Kitchen omeprazole (PRILOSEC)  40 MG capsule Take 40 mg by mouth daily.    . potassium chloride (KLOR-CON) 10 MEQ tablet Take 4 tablets (40 mEq total) by mouth 2 (two) times daily. 240 tablet 3  . rosuvastatin (CRESTOR) 5 MG tablet TAKE 1 TABLET DAILY 90 tablet 3  . Selexipag (UPTRAVI) 1000 MCG TABS Take 1,000 mcg by mouth 2 (two) times daily. 60 tablet 11  . tadalafil, PAH, (ADCIRCA) 20 MG tablet Take 2 tablets (40 mg total) by mouth daily. 90 tablet 1  . thiamine 100 MG tablet Take 1 tablet (100 mg total) by mouth daily. 30 tablet 5  . torsemide (DEMADEX) 20 MG tablet Take 3 tablets (60 mg total) by mouth every morning AND 2 tablets (40 mg total) every evening. 150 tablet 3  . Blood Pressure Monitoring (BLOOD PRESSURE CUFF) MISC Use as directed. Check blood pressure bid prn Dx:I10 1 each 0    Results for orders placed or performed during the hospital encounter of 09/22/19 (from the past 48 hour(s))  SARS Coronavirus 2 by RT PCR (hospital order, performed in [facility] hospital lab) Nasopharyngeal Nasopharyngeal Swab     Status: None   Collection Time: 09/22/19  3:40 PM   Specimen: Nasopharyngeal Swab  Result Value Ref Range   SARS Coronavirus 2 NEGATIVE NEGATIVE    Comment: (NOTE) If result is NEGATIVE SARS-CoV-2 target nucleic acids are NOT DETECTED. The  SARS-CoV-2 RNA is generally detectable in upper and lower  respiratory specimens during the acute phase of infection. The lowest  concentration of SARS-CoV-2 viral copies this assay can detect is 250  copies / mL. A negative result does not preclude SARS-CoV-2 infection  and should not be used as the sole basis for treatment or other  patient management decisions.  A negative result may occur with  improper specimen collection / handling, submission of specimen other  than nasopharyngeal swab, presence of viral mutation(s) within the  areas targeted by this assay, and inadequate number of viral copies  (<250 copies / mL). A negative result must be combined with clinical  observations, patient history, and epidemiological information. If result is POSITIVE SARS-CoV-2 target nucleic acids are DETECTED. The SARS-CoV-2 RNA is generally detectable in upper and lower  respiratory specimens dur ing the acute phase of infection.  Positive  results are indicative of active infection with SARS-CoV-2.  Clinical  correlation with patient history and other diagnostic information is  necessary to determine patient infection status.  Positive results do  not rule out bacterial infection or co-infection with other viruses. If result is PRESUMPTIVE POSTIVE SARS-CoV-2 nucleic acids MAY BE PRESENT.   A presumptive positive result was obtained on the submitted specimen  and confirmed on repeat testing.  While 2019 novel coronavirus  (SARS-CoV-2) nucleic acids may be present in the submitted sample  additional confirmatory testing may be necessary for epidemiological  and / or clinical management purposes  to differentiate between  SARS-CoV-2 and other Sarbecovirus currently known to infect humans.  If clinically indicated additional testing with an alternate test  methodology [phone]) is advised. The SARS-CoV-2 RNA is generally  detectable in upper and lower respiratory sp ecimens during the acute   phase of infection. The expected result is Negative. Fact Sheet for Patients:  StrictlyIdeas.no Fact Sheet for Healthcare Providers: BankingDealers.co.za This test is not yet approved or cleared by the [place] FDA and has been authorized for detection and/or diagnosis of SARS-CoV-2 by FDA under an Emergency Use Authorization (EUA).  This EUA  will remain in effect (meaning this test can be used) for the duration of the COVID-19 declaration under Section 564(b)(1) of the Act, 21 U.S.C. section 360bbb-3(b)(1), unless the authorization is terminated or revoked sooner. Performed at [facility], [place] [address] [place], [place] [address]   CBC     Status: Abnormal   Collection Time: 09/22/19  4:46 PM  Result Value Ref Range   WBC 1.5 (L) 4.0 - 10.5 K/uL   RBC 2.94 (L) 4.22 - 5.81 MIL/uL   Hemoglobin 10.3 (L) 13.0 - 17.0 g/dL   HCT 33.5 (L) 39.0 - 52.0 %   MCV 113.9 (H) 80.0 - 100.0 fL   MCH 35.0 (H) 26.0 - 34.0 pg   MCHC 30.7 30.0 - 36.0 g/dL   RDW 18.7 (H) 11.5 - 15.5 %   Platelets 27 (LL) 150 - 400 K/uL    Comment: REPEATED TO VERIFY PLATELET COUNT CONFIRMED BY SMEAR Immature Platelet Fraction may be clinically indicated, consider ordering this additional test [id number] THIS CRITICAL RESULT HAS VERIFIED AND BEEN CALLED TO S.HOGUE RN BY KATHERINE MCCORMICK ON 11 19 2020 AT 2902, AND HAS BEEN READ BACK.     nRBC 0.0 0.0 - 0.2 %    Comment: Performed at [facility], [place] [address], [place], [place] [address]  I-STAT, [place] 8     Status: Abnormal   Collection Time: 09/22/19  5:53 PM  Result Value Ref Range   Sodium 144 135 - 145 mmol/L   Potassium 4.0 3.5 - 5.1 mmol/L   Chloride 111 98 - 111 mmol/L   BUN 29 (H) 8 - 23 mg/dL   Creatinine, Ser 1.60 (H) 0.61 - 1.24 mg/dL   Glucose, Bld 67 (L) 70 - 99 mg/dL   Calcium, Ion 1.16 1.15 - 1.40 mmol/L   TCO2 21 (L) 22 - 32 mmol/L   Hemoglobin 11.2 (L) 13.0 - 17.0 g/dL    HCT 33.0 (L) 39.0 - 52.0 %   No results found.  Review of Systems  Eyes: Positive for blurred vision.  All other systems reviewed and are negative.   Blood pressure (!) (P) 152/90, pulse (P) 79, temperature (P) 98 F (36.7 C), temperature source (P) Oral, resp. rate (P) 20, height 6' (1.829 m), weight 94.8 kg, SpO2 (P) 100 %. Physical Exam  Constitutional: He appears well-developed and well-nourished.  Eyes:       Assessment/Plan 1. RD OD: PPv/Ec/EL/GFX OD  Corliss Parish, MD 09/22/2019, 6:17 PM

## 2019-09-22 NOTE — Brief Op Note (Signed)
09/22/2019  7:41 PM  PATIENT:  Darren Mueller  68 y.o. male  PRE-OPERATIVE DIAGNOSIS:  RETINAL DETACHMENT RIGHT EYE  POST-OPERATIVE DIAGNOSIS:  * No post-op diagnosis entered *  PROCEDURE:  Procedure(s): PARS PLANA VITRECTOMY WITH 25 GAUGE (Right) PHOTOCOAGULATION WITH LASER (Right) GAS/FLUID EXCHANGE (Right)  SURGEON:  Surgeon(s) and Role:    Sherlynn Stalls, MD - Primary  PHYSICIAN ASSISTANT:   ASSISTANTS: none   ANESTHESIA:   MAC  EBL:  Min.   BLOOD ADMINISTERED:none  DRAINS: none   LOCAL MEDICATIONS USED:  BUPIVICAINE   SPECIMEN:  No Specimen  DISPOSITION OF SPECIMEN:  N/A  COUNTS:  YES  TOURNIQUET:  * No tourniquets in log *  DICTATION: .Other Dictation: Dictation Number [id number]  PLAN OF CARE: Discharge to home after PACU  PATIENT DISPOSITION:  PACU - hemodynamically stable.   Delay start of Pharmacological VTE agent (>24hrs) due to surgical blood loss or risk of bleeding: not applicable

## 2019-09-22 NOTE — Progress Notes (Signed)
Dr. Baird Cancer made aware of platelet count 27. No new orders received at this time.

## 2019-09-22 NOTE — Anesthesia Postprocedure Evaluation (Signed)
Anesthesia Post Note  Patient: Darren Mueller  Procedure(s) Performed: PARS PLANA VITRECTOMY WITH 25 GAUGE RIGHT EYE (Right Eye) PHOTOCOAGULATION WITH LASER RIGHT EYE (Right Eye) GAS/FLUID EXCHANGE (C3F8) RIGHT EYE (Right Eye)     Patient location during evaluation: PACU Anesthesia Type: MAC Level of consciousness: awake and alert Pain management: pain level controlled Vital Signs Assessment: post-procedure vital signs reviewed and stable Respiratory status: spontaneous breathing, nonlabored ventilation, respiratory function stable and patient connected to nasal cannula oxygen Cardiovascular status: stable and blood pressure returned to baseline Postop Assessment: no apparent nausea or vomiting Anesthetic complications: no    Last Vitals:  Vitals:   09/22/19 1943 09/22/19 1958  BP: 139/78 126/76  Pulse: 78 88  Resp: 17 16  Temp: 36.7 C 36.6 C  SpO2: 100% 100%    Last Pain:  Vitals:   09/22/19 1958  TempSrc:   PainSc: 0-No pain                 Tiajuana Amass

## 2019-09-22 NOTE — Transfer of Care (Signed)
Immediate Anesthesia Transfer of Care Note  Patient: Darren Mueller  Procedure(s) Performed: PARS PLANA VITRECTOMY WITH 25 GAUGE (Right Eye) PHOTOCOAGULATION WITH LASER (Right Eye) GAS/FLUID EXCHANGE (Right Eye)  Patient Location: PACU  Anesthesia Type:Regional  Level of Consciousness: awake, alert , oriented and patient cooperative  Airway & Oxygen Therapy: Patient Spontanous Breathing  Post-op Assessment: Report given to RN, Post -op Vital signs reviewed and stable and Patient moving all extremities X 4  Post vital signs: Reviewed and stable  Last Vitals:  Vitals Value Taken Time  BP 139/78 09/22/19 1943  Temp    Pulse 87 09/22/19 1944  Resp 25 09/22/19 1944  SpO2 100 % 09/22/19 1944  Vitals shown include unvalidated device data.  Last Pain:  Vitals:   09/22/19 1637  TempSrc:   PainSc: 0-No pain      Patients Stated Pain Goal: 0 (73/54/30 1484)  Complications: No apparent anesthesia complications

## 2019-09-22 NOTE — Anesthesia Preprocedure Evaluation (Addendum)
Anesthesia Evaluation  Patient identified by MRN, date of birth, ID band Patient awake    Reviewed: Allergy & Precautions, H&P , NPO status , Patient's Chart, lab work & pertinent test results  Airway Mallampati: III  TM Distance: >3 FB Neck ROM: Full    Dental no notable dental hx. (+) Teeth Intact, Dental Advisory Given   Pulmonary sleep apnea , COPD,  COPD inhaler, former smoker,    Pulmonary exam normal breath sounds clear to auscultation       Cardiovascular hypertension, + CAD and +CHF   Rhythm:Regular Rate:Normal     Neuro/Psych  Headaches, Seizures -,  negative psych ROS   GI/Hepatic GERD  Medicated and Controlled,(+) Cirrhosis   ascites  substance abuse  alcohol use,   Endo/Other  negative endocrine ROSHypothyroidism   Renal/GU Renal InsufficiencyRenal disease  negative genitourinary   Musculoskeletal   Abdominal   Peds  Hematology negative hematology ROS (+)   Anesthesia Other Findings   Reproductive/Obstetrics negative OB ROS                            Anesthesia Physical Anesthesia Plan  ASA: III  Anesthesia Plan: MAC   Post-op Pain Management:    Induction: Intravenous  PONV Risk Score and Plan: 2 and Propofol infusion, Midazolam and Ondansetron  Airway Management Planned: Nasal Cannula  Additional Equipment:   Intra-op Plan:   Post-operative Plan:   Informed Consent: I have reviewed the patients History and Physical, chart, labs and discussed the procedure including the risks, benefits and alternatives for the proposed anesthesia with the patient or authorized representative who has indicated his/her understanding and acceptance.     Dental advisory given  Plan Discussed with: CRNA  Anesthesia Plan Comments:         Anesthesia Quick Evaluation

## 2019-09-22 NOTE — OR Nursing (Signed)
Patient Black metal frame glasses placed in bag with patient label and attached to patient chart.

## 2019-09-23 ENCOUNTER — Encounter (HOSPITAL_COMMUNITY): Payer: Self-pay | Admitting: Ophthalmology

## 2019-09-23 DIAGNOSIS — H33011 Retinal detachment with single break, right eye: Secondary | ICD-10-CM | POA: Diagnosis not present

## 2019-09-23 NOTE — Op Note (Signed)
NAME: Darren Mueller, Darren Mueller MEDICAL RECORD [id number]:[id number] ACCOUNT [id number] DATE OF BIRTH:08/14/1951 FACILITY: [facility] LOCATION: [facility] PHYSICIAN:Kaelum Kissick Adele Dan, MD  OPERATIVE REPORT  DATE OF PROCEDURE:  09/22/2019  SURGEON:  Stephannie Li, MD   ANESTHESIA:  Local MAC retrobulbar injection of the right eye.  PREOPERATIVE DIAGNOSIS:  Retinal detachment, right eye.  POSTOPERATIVE DIAGNOSIS:  Retinal detachment, right eye.  OPERATION PROCEDURE:  Pars plana vitrectomy with endolaser, endocautery, endo drain of subretinal fluid with gas fluid exchange using 14% C3F8 gas.  COMPLICATIONS:  None.  FINDINGS:  Multiple retinal breaks and a macula on retinal detachment involving the nasal retina of the right eye.  Over 90 degrees of retina were involved in the retinal detachment.  DESCRIPTION OF PROCEDURE:  The patient was identified in the preoperative holding area.  Signed consent was placed on the chart.  He was taken to the operating room where he was gently sedated by the anesthesia team.  At that point in time, the right eye  was anesthetized using a retrobulbar block consisting of a 1:1 mixture of 0.75% bupivacaine and 1% lidocaine and 150 units of Hylenex.  Retrobulbar needle was advanced into the retrobulbar space.  The block was administered and the needle was removed.   No hemorrhaging was encountered.  After the eye achieved excellent akinesia and anesthesia, the right eye was prepped and draped in the usual sterile fashion for ocular surgery.  A Lieberman speculum was placed between the right upper and lower eyelids  for exposure.  25-gauge trocars were used to introduce transconjunctival cannulas in inferotemporal, supratemporal, and supranasal quadrants.  The cannulas were left in place and the trocars were removed.  Next, an infusion cannula was confirmed to be in  the vitreous cavity prior to its use.  The infusion cannula was attached to the inferior temporal cannula.  Next, the  eye underwent a core vitrectomy with the vitreous cutter and light pipe.  The vitrectomy was performed easily.  Vitreous was trimmed  out to the periphery.  At that point in time, the eye was inspected with scleral depression.  Retinal breaks were located in the right eye from approximately 11 o'clock clockwise to 3 o'clock.  These retinal breaks were marked using endocautery.  Once  this was completed, a soft tip extrusion cannula was used to perform an air fluid exchange.  Residual posterior subretinal fluid was noted to be in the posterior pole.  Therefore, a posterior retinotomy was performed using an endocautery probe.   Endocautery probe was used to perform a retinotomy in the posterior pole outside of the macula.  Endocautery probe was removed and the soft tip extrusion cannula was used to remove the remaining subretinal fluid from the posterior pole.  Once the retina  was completely flattened, endolaser photocoagulation was used to surround all retinal breaks with confluent laser.  The laser was also used to secure the retinotomy with confluent laser.  After this was completed, the retina was in excellent condition  looked very good.  There was no subretinal fluid or bleeding.  Therefore, the eye was flushed with a 14% concentration of C3F8 gas.  Once this was completed, the cannulas were removed and the sclerotomies.  Sclerotomies were found to be airtight.  The  eye was then treated with subconjunctival injections of 50 mg Ancef and 1 mg of dexamethasone.  The eye was treated topically with one drop of 1% atropine and TobraDex ointment.  The speculum was removed.  Eyelids were cleaned and closed.  They were then  patched and shielded.  The patient was taken to recovery in excellent condition, having tolerated the procedure very well.  CN/NUANCE  D:09/22/2019 T:09/23/2019 JOB:[id number]

## 2019-09-26 ENCOUNTER — Other Ambulatory Visit (HOSPITAL_COMMUNITY): Payer: Self-pay | Admitting: *Deleted

## 2019-09-26 ENCOUNTER — Telehealth (HOSPITAL_COMMUNITY): Payer: Self-pay | Admitting: Cardiology

## 2019-09-26 DIAGNOSIS — I272 Pulmonary hypertension, unspecified: Secondary | ICD-10-CM

## 2019-09-26 MED ORDER — OPSUMIT 10 MG PO TABS: 10.0000 mg | ORAL_TABLET | Freq: Every day | ORAL | 3 refills | Status: DC

## 2019-09-26 NOTE — Telephone Encounter (Signed)
Patient called to report he was able to have urgent surgical procedure that was cleared by Dr Haroldine Laws, however patients procedure was done at the hospital in short stay. Patients pre procedure labs were abnormal and wanted to be sure provider was aware  Message to provider as Juluis Rainier

## 2019-09-26 NOTE — Telephone Encounter (Signed)
Chronic pancytopenia. He is followed by hematology

## 2019-09-28 ENCOUNTER — Ambulatory Visit (INDEPENDENT_AMBULATORY_CARE_PROVIDER_SITE_OTHER): Payer: Medicare Other | Admitting: *Deleted

## 2019-09-28 DIAGNOSIS — R55 Syncope and collapse: Secondary | ICD-10-CM | POA: Diagnosis not present

## 2019-09-28 LAB — CUP PACEART REMOTE DEVICE CHECK
Date Time Interrogation Session: 20201125104202
Implantable Pulse Generator Implant Date: 20180301

## 2019-10-10 ENCOUNTER — Other Ambulatory Visit: Payer: Self-pay

## 2019-10-11 ENCOUNTER — Ambulatory Visit (INDEPENDENT_AMBULATORY_CARE_PROVIDER_SITE_OTHER): Payer: Medicare Other | Admitting: Medical

## 2019-10-11 ENCOUNTER — Ambulatory Visit (HOSPITAL_BASED_OUTPATIENT_CLINIC_OR_DEPARTMENT_OTHER)
Admission: RE | Admit: 2019-10-11 | Discharge: 2019-10-11 | Disposition: A | Discharge status: Home / Self Care | Payer: Medicare Other | Source: Ambulatory Visit | Attending: Medical | Admitting: Medical

## 2019-10-11 ENCOUNTER — Other Ambulatory Visit: Payer: Self-pay

## 2019-10-11 ENCOUNTER — Encounter: Payer: Self-pay | Admitting: Medical

## 2019-10-11 VITALS — BP 118/70 | HR 72 | Temp 98.0°F | Resp 12 | Ht 72.0 in | Wt 227.2 lb

## 2019-10-11 DIAGNOSIS — E785 Hyperlipidemia, unspecified: Secondary | ICD-10-CM

## 2019-10-11 DIAGNOSIS — E876 Hypokalemia: Secondary | ICD-10-CM

## 2019-10-11 DIAGNOSIS — N183 Chronic kidney disease, stage 3 unspecified: Secondary | ICD-10-CM | POA: Diagnosis not present

## 2019-10-11 DIAGNOSIS — I10 Essential (primary) hypertension: Secondary | ICD-10-CM | POA: Diagnosis not present

## 2019-10-11 DIAGNOSIS — I509 Heart failure, unspecified: Secondary | ICD-10-CM | POA: Diagnosis not present

## 2019-10-11 DIAGNOSIS — J811 Chronic pulmonary edema: Secondary | ICD-10-CM | POA: Diagnosis not present

## 2019-10-11 DIAGNOSIS — R0600 Dyspnea, unspecified: Secondary | ICD-10-CM | POA: Insufficient documentation

## 2019-10-11 LAB — COMPREHENSIVE METABOLIC PANEL
ALT: 16 U/L (ref 0–53)
AST: 55 U/L — ABNORMAL HIGH (ref 0–37)
Albumin: 3.2 g/dL — ABNORMAL LOW (ref 3.5–5.2)
Alkaline Phosphatase: 379 U/L — ABNORMAL HIGH (ref 39–117)
BUN: 24 mg/dL — ABNORMAL HIGH (ref 6–23)
CO2: 24 mEq/L (ref 19–32)
Calcium: 8.5 mg/dL (ref 8.4–10.5)
Chloride: 107 mEq/L (ref 96–112)
Creatinine, Ser: 1.53 mg/dL — ABNORMAL HIGH (ref 0.40–1.50)
GFR: 54.77 mL/min — ABNORMAL LOW (ref >60.00)
Glucose, Bld: 94 mg/dL (ref 70–99)
Potassium: 3.9 mEq/L (ref 3.5–5.1)
Sodium: 137 mEq/L (ref 135–145)
Total Bilirubin: 2.5 mg/dL — ABNORMAL HIGH (ref 0.2–1.2)
Total Protein: 6.8 g/dL (ref 6.0–8.3)

## 2019-10-11 LAB — BRAIN NATRIURETIC PEPTIDE: Pro B Natriuretic peptide (BNP): 604 pg/mL — ABNORMAL HIGH (ref 0.0–100.0)

## 2019-10-11 LAB — LIPID PANEL
Cholesterol: 145 mg/dL (ref 0–200)
HDL: 57 mg/dL (ref >39.00)
LDL Cholesterol: 73 mg/dL (ref 0–99)
NonHDL: 87.54
Total CHOL/HDL Ratio: 3
Triglycerides: 74 mg/dL (ref 0.0–149.0)
VLDL: 14.8 mg/dL (ref 0.0–40.0)

## 2019-10-11 NOTE — Patient Instructions (Signed)
For history of CHF with recent weight gain, will get chest x-ray stat, CMP that and BNP stat.  After reviewing these will advise you on likely increasing your Lasix.  Also will need to follow potassium value and might need to increase potassium dose after review.   For hypertension, continue current medication regimen.  If we need to increase diuretic dose then check BPs and also be careful on changing positions as we do not want you to get a lightheaded or dizzy if BP were to drop excessively.    For dyspnea, will follow x-ray and BNP study.  Currently oxygen sats are between 97% and 100%.  For CKD, will follow CMP results.  Follow-up date to be determined after labs and imaging reviewed.  If any severe shortness of breath and weight gain despite treatment advice then recommend ED evaluation.

## 2019-10-11 NOTE — Progress Notes (Signed)
Subjective:    Patient ID: Darren Mueller, male    DOB: 1951/05/05, 68 y.o.   MRN: [id number]  HPI  Pt in for follow up.  Pt updates me that he recently had detached retina of rt eye. He saw a specialist. He eventually had surgery done at [facility] rather than in office. Pt states vision is improving.  Pt weight is recently elevated. Mild sob when he lays supine. Pt states gradual increase of weight since thanksgiving.  He has history of chf. No chest pain reported. Pt is only on lasix 40 mg 1 tab po bid. Pt is not on zaroxylin or demadex on review.  Pt also has history of pulmonary arterial hyertension.  Also hx of ckd.   Hx of high cholesterol and low K. Will get lipid panel and cmp today.  Pt not having constipation. He states presently resolved.   Review of Systems  Constitutional: Negative for chills, fatigue and fever.  HENT: Negative for congestion, ear discharge and ear pain.   Respiratory: Positive for shortness of breath. Negative for cough, chest tightness and wheezing.   Cardiovascular: Negative for chest pain and palpitations.  Gastrointestinal: Negative for abdominal pain, constipation and diarrhea.  Genitourinary: Negative for dysuria, flank pain and hematuria.  Musculoskeletal: Negative for back pain, neck pain and neck stiffness.       Pedal edema.  Skin: Negative for rash.  Neurological: Negative for dizziness, seizures, weakness and headaches.  Hematological: Negative for adenopathy. Does not bruise/bleed easily.  Psychiatric/Behavioral: Negative for behavioral problems, dysphoric mood and suicidal ideas. The patient is not nervous/anxious.     Past Medical History:  Diagnosis Date  . Alcohol dependence ([place]) 03/22/2011   In remission   . Alcohol dependence in remission ([place]) 03/22/2011   In remission   . Alcoholic cirrhosis of liver with ascites ([place]) 2014  . Anginal pain ([place])    with SOB admitted 04/20/2019  . Cardiomegaly 02/2019   Noted on  CXR  . CHF (congestive heart failure) ([place])   . CKD (chronic kidney disease), stage III 07/18/2013  . COPD (chronic obstructive pulmonary disease) ([place])   . Coronary artery disease   . Dermatitis 06/10/2015  . Diarrhea 09/04/2013  . Dyspnea    admitted 04/20/2019  . Elbow pain, left 03/17/2017  . Eustachian tube dysfunction   . GERD (gastroesophageal reflux disease)   . Gout   . Headache   . Hearing loss   . Heart murmur   . Hx of colonic polyps   . Hypertension   . Hypertriglyceridemia 03/17/2017  . Hypopotassemia   . Otalgia 11/28/2013  . Other chronic pulmonary heart diseases   . PAH (pulmonary artery hypertension) ([facility])    RV failure  . Pancytopenia ([place])   . Pleural effusion 2014   Noted on CT:   Moderately large simple layering right pleural effusion   . Preventative health care 06/29/2016  . Pulmonary nodule 2014   Noted on CT: An 8 mm pulmonary nodule in the periphery of the left lower lobe  . Rectal bleeding   . Red eye 03/03/2016   right  . Seizures ([place])   . Sleep apnea    does not wear CPAP  . Splenomegaly 12/2015   Noted on CT  . Thoracic aorta atherosclerosis ([place]) 12/2017  . Thrombocytopenia ([place]) 06/29/2016  . Unspecified hypothyroidism 01/25/2013  . Unspecified pleural effusion   . Vertigo   . Wears glasses      Social History  Socioeconomic History  . Marital status: Divorced    Spouse name: Not on file  . Number of children: 3  . Years of education: Not on file  . Highest education level: Not on file  Occupational History  . Occupation: Retired    Comment: Microbiologist  . Financial resource strain: Not hard at all  . Food insecurity    Worry: Never true    Inability: Never true  . Transportation needs    Medical: No    Non-medical: No  Tobacco Use  . Smoking status: Former Smoker    Packs/day: 2.00    Years: 5.00    Pack years: 10.00    Types: Cigarettes    Quit date: 09/22/1979    Years since quitting: 40.0  . Smokeless  tobacco: Never Used  Substance and Sexual Activity  . Alcohol use: Yes    Alcohol/week: 6.0 standard drinks    Types: 6 Shots of liquor per week    Comment: social drinker  . Drug use: No  . Sexual activity: Yes    Birth control/protection: Spermicide  Lifestyle  . Physical activity    Days per week: 0 days    Minutes per session: 0 min  . Stress: Rather much  Relationships  . Social connections    Talks on phone: More than three times a week    Gets together: Three times a week    Attends religious service: More than 4 times per year    Active member of club or organization: Yes    Attends meetings of clubs or organizations: More than 4 times per year    Relationship status: Divorced  . Intimate partner violence    Fear of current or ex partner: Not on file    Emotionally abused: Not on file    Physically abused: Not on file    Forced sexual activity: Not on file  Other Topics Concern  . Not on file  Social History Narrative   0 caffeine drinks daily     Past Surgical History:  Procedure Laterality Date  . CARDIAC CATHETERIZATION N/A 12/21/2015   Procedure: Right Heart Cath;  Surgeon: Jolaine Artist, MD;  Location: [place] [facility];  Service: Cardiovascular;  Laterality: N/A;  . CARDIAC CATHETERIZATION N/A 04/15/2016   Procedure: Right/Left Heart Cath and Coronary Angiography;  Surgeon: Jolaine Artist, MD;  Location: [place] [facility];  Service: Cardiovascular;  Laterality: N/A;  . COLONOSCOPY N/A 12/08/2013   Procedure: COLONOSCOPY;  Surgeon: Milus Banister, MD;  Location: [facility];  Service: Endoscopy;  Laterality: N/A;  . COLONOSCOPY WITH PROPOFOL N/A 03/24/2019   Procedure: COLONOSCOPY WITH PROPOFOL;  Surgeon: Milus Banister, MD;  Location: [facility];  Service: Endoscopy;  Laterality: N/A;  Draw CBC in preop  . COLONOSCOPY WITH PROPOFOL N/A 04/28/2019   Procedure: COLONOSCOPY WITH PROPOFOL;  Surgeon: Milus Banister, MD;  Location: [facility];   Service: Endoscopy;  Laterality: N/A;  . ESOPHAGOGASTRODUODENOSCOPY (EGD) WITH PROPOFOL N/A 03/27/2016   Procedure: ESOPHAGOGASTRODUODENOSCOPY (EGD) WITH PROPOFOL;  Surgeon: Milus Banister, MD;  Location: [place];  Service: Endoscopy;  Laterality: N/A;  . ESOPHAGOGASTRODUODENOSCOPY (EGD) WITH PROPOFOL N/A 02/14/2019   Procedure: ESOPHAGOGASTRODUODENOSCOPY (EGD) WITH PROPOFOL;  Surgeon: Jerene Bears, MD;  Location: [facility] ENDOSCOPY;  Service: Gastroenterology;  Laterality: N/A;  . EYE SURGERY Bilateral 03/15/2019   lasix  . GAS/FLUID EXCHANGE Right 09/22/2019   Procedure: GAS/FLUID EXCHANGE (C3F8) RIGHT EYE;  Surgeon: Baird Cancer,  Corene Cornea, MD;  Location: [place];  Service: Ophthalmology;  Laterality: Right;  . HEMOSTASIS CLIP PLACEMENT  04/28/2019   Procedure: HEMOSTASIS CLIP PLACEMENT;  Surgeon: Milus Banister, MD;  Location: [facility];  Service: Endoscopy;;  . LOOP RECORDER INSERTION N/A 01/01/2017   Procedure: Loop Recorder Insertion;  Surgeon: Thompson Grayer, MD;  Location: [place] [facility];  Service: Cardiovascular;  Laterality: N/A;  . PARS PLANA VITRECTOMY Right 09/22/2019   Procedure: PARS PLANA VITRECTOMY WITH 25 GAUGE RIGHT EYE;  Surgeon: Sherlynn Stalls, MD;  Location: [place];  Service: Ophthalmology;  Laterality: Right;  . PHOTOCOAGULATION WITH LASER Right 09/22/2019   Procedure: PHOTOCOAGULATION WITH LASER RIGHT EYE;  Surgeon: Sherlynn Stalls, MD;  Location: [place];  Service: Ophthalmology;  Laterality: Right;  . POLYPECTOMY  04/28/2019   Procedure: POLYPECTOMY;  Surgeon: Milus Banister, MD;  Location: [facility];  Service: Endoscopy;;  . RIGHT HEART CATHETERIZATION Right 06/06/2014   Procedure: RIGHT HEART CATH;  Surgeon: Jolaine Artist, MD;  Location: [facility] C[facility] LAB;  Service: Cardiovascular;  Laterality: Right;  . UVULOPALATOPHARYNGOPLASTY  1999    Family History  Problem Relation Age of Onset  . Heart disease Mother   . Heart attack Mother   . Hypertension Mother   . Kidney  failure Mother   . Liver disease Mother        stage 4 liver disease  . Prostate cancer Father   . Cancer Father        lung  . Hypertension Brother   . Gout Brother   . Alcoholism Maternal Grandfather   . Liver disease Maternal Grandfather   . Migraines Sister   . Hypertension Brother   . Colon cancer Neg Hx     Allergies  Allergen Reactions  . Aspirin Hypertension  . Nitroglycerin Other (See Comments)    Headache    Current Outpatient Medications on File Prior to Visit  Medication Sig Dispense Refill  . acetaminophen (TYLENOL) 500 MG tablet Take 1,000 mg by mouth every 6 (six) hours as needed (for pain.).    Marland Kitchen albuterol (VENTOLIN HFA) 108 (90 Base) MCG/ACT inhaler Inhale 2 puffs into the lungs every 6 (six) hours as needed for wheezing or shortness of breath.    . allopurinol (ZYLOPRIM) 100 MG tablet Take 2 tablets (200 mg total) by mouth daily. 180 tablet 1  . Blood Pressure Monitoring (BLOOD PRESSURE CUFF) MISC Use as directed. Check blood pressure bid prn Dx:I10 1 each 0  . colchicine 0.6 MG tablet Take 0.6 mg by mouth daily as needed (gout flare).    . diphenoxylate-atropine (LOMOTIL) 2.5-0.025 MG tablet Take 1 tablet by mouth 3 (three) times daily. 60 tablet 0  . Ferrous Fumarate (HEMOCYTE) 324 (106 Fe) MG TABS tablet Take 1 tablet (106 mg of iron total) by mouth daily. 30 tablet 5  . fluticasone-salmeterol (ADVAIR HFA) 230-21 MCG/ACT inhaler Inhale 2 puffs into the lungs every morning. 48 g 1  . folic acid (FOLVITE) 1 MG tablet Take 1 tablet (1 mg total) by mouth daily. 30 tablet 5  . hydrOXYzine (ATARAX/VISTARIL) 25 MG tablet Take 1 tablet (25 mg total) by mouth 3 (three) times daily as needed. 30 tablet 0  . hyoscyamine (LEVSIN SL) 0.125 MG SL tablet Place 1 tablet (0.125 mg total) under the tongue every 6 (six) hours as needed. 30 tablet 1  . macitentan (OPSUMIT) 10 MG tablet Take 1 tablet (10 mg total) by mouth daily. 90 tablet 3  . Magnesium  Oxide (MAG-OXIDE) 200 MG  TABS Take 2 tablets (400 mg total) by mouth daily. 180 tablet 3  . metolazone (ZAROXOLYN) 2.5 MG tablet Take 1 tablet (2.5 mg total) by mouth as directed. As directed by heart failure team 30 tablet 0  . mometasone-formoterol (DULERA) 100-5 MCG/ACT AERO Inhale 2 puffs into the lungs 2 (two) times daily.    . Multiple Vitamin (MULTIVITAMIN) tablet Take 1 tablet by mouth daily.    Marland Kitchen omeprazole (PRILOSEC) 40 MG capsule Take 40 mg by mouth daily.    . potassium chloride (KLOR-CON) 10 MEQ tablet Take 4 tablets (40 mEq total) by mouth 2 (two) times daily. 240 tablet 3  . rosuvastatin (CRESTOR) 5 MG tablet TAKE 1 TABLET DAILY 90 tablet 3  . Selexipag (UPTRAVI) 1000 MCG TABS Take 1,000 mcg by mouth 2 (two) times daily. 60 tablet 11  . tadalafil, PAH, (ADCIRCA) 20 MG tablet Take 2 tablets (40 mg total) by mouth daily. 90 tablet 1  . thiamine 100 MG tablet Take 1 tablet (100 mg total) by mouth daily. 30 tablet 5  . torsemide (DEMADEX) 20 MG tablet Take 3 tablets (60 mg total) by mouth every morning AND 2 tablets (40 mg total) every evening. 150 tablet 3   No current facility-administered medications on file prior to visit.     BP 109/67 (BP Location: Right Arm, Cuff Size: Large)   Pulse 72   Temp 98 F (36.7 C) (Temporal)   Resp 12   Ht 6' (1.829 m)   Wt 227 lb 3.2 oz (103.1 kg) Comment: with shoes  SpO2 100%   BMI 30.81 kg/m       Objective:   Physical Exam  General Mental Status- Alert. General Appearance- Not in acute distress.   Skin General: Color- Normal Color. Moisture- Normal Moisture.  Neck Carotid Arteries- Normal color. Moisture- Normal Moisture. No carotid bruits. No JVD.  Chest and Lung Exam Auscultation: Breath Sounds:-Normal.  Cardiovascular Auscultation:Rythm- Regular. Murmurs & Other Heart Sounds:Auscultation of the heart reveals- No Murmurs.  Abdomen Inspection:-Inspeection Normal. Palpation/Percussion:Note:No mass. Palpation and Percussion of the abdomen  reveal- Non Tender, Non Distended + BS, no rebound or guarding.   Neurologic Cranial Nerve exam:- CN III-XII intact(No nystagmus), symmetric smile. Strength:- 5/5 equal and symmetric strength both upper and lower extremities.  Lower ext- bilateral pedal edema 2-3 + pedal edema both legs. Negative homan signs.     Assessment & Plan:  For history of CHF with recent weight gain, will get chest x-ray stat, CMP that and BNP stat.  After reviewing these will advise you on likely increasing your Lasix.  Also will need to follow potassium value and might need to increase potassium dose after review.   For hypertension, continue current medication regimen.  If we need to increase diuretic dose then check BPs and also be careful on changing positions as we do not want you to get a lightheaded or dizzy if BP were to drop excessively.    For dyspnea, will follow x-ray and BNP study.  Currently oxygen sats are between 97% and 100%.  For CKD, will follow CMP results.  Follow-up date to be determined after labs and imaging reviewed.  If any severe shortness of breath and weight gain despite treatment advice then recommend ED evaluation.   25 + minutes spent with pt. 50% of time spent counseling pt on plan going forward. Mackie Pai, PA-C

## 2019-10-18 ENCOUNTER — Other Ambulatory Visit: Payer: Self-pay

## 2019-10-18 ENCOUNTER — Encounter: Payer: Self-pay | Admitting: Medical

## 2019-10-18 ENCOUNTER — Ambulatory Visit (HOSPITAL_BASED_OUTPATIENT_CLINIC_OR_DEPARTMENT_OTHER)
Admission: RE | Admit: 2019-10-18 | Discharge: 2019-10-18 | Disposition: A | Discharge status: Home / Self Care | Payer: Medicare Other | Source: Ambulatory Visit | Attending: Medical | Admitting: Medical

## 2019-10-18 ENCOUNTER — Ambulatory Visit (INDEPENDENT_AMBULATORY_CARE_PROVIDER_SITE_OTHER): Payer: Medicare Other | Admitting: Medical

## 2019-10-18 ENCOUNTER — Telehealth: Payer: Self-pay | Admitting: Medical

## 2019-10-18 VITALS — BP 116/57 | HR 80 | Temp 97.7°F | Resp 12 | Ht 72.0 in | Wt 227.0 lb

## 2019-10-18 DIAGNOSIS — I509 Heart failure, unspecified: Secondary | ICD-10-CM

## 2019-10-18 DIAGNOSIS — N183 Chronic kidney disease, stage 3 unspecified: Secondary | ICD-10-CM | POA: Diagnosis not present

## 2019-10-18 DIAGNOSIS — N189 Chronic kidney disease, unspecified: Secondary | ICD-10-CM | POA: Diagnosis not present

## 2019-10-18 MED ORDER — TORSEMIDE 20 MG PO TABS: ORAL_TABLET | ORAL | 3 refills | Status: DC

## 2019-10-18 MED FILL — TORSEMIDE 20 MG TABLET: 20 | 30 days supply | Qty: 150 | Fill #0

## 2019-10-18 MED FILL — FOLIC ACID 1 MG TABS: 1 | 30 days supply | Qty: 30 | Fill #1

## 2019-10-18 MED FILL — TADALAFIL (PAH) 20 MG TABS: 20 | 15 days supply | Qty: 30 | Fill #2

## 2019-10-18 NOTE — Telephone Encounter (Signed)
Opened to review and send result note to cardiologist.

## 2019-10-18 NOTE — Progress Notes (Signed)
Subjective:    Patient ID: Darren Mueller, male    DOB: 09/19/1951, 68 y.o.   MRN: [id number]  HPI  Pt in for follow up.  Pt has seen his nephrologist today. She thought swelling of both legs up to his thighs. This is despite  increase of lasix form 40 mg bid to 40 mg tid.   Labs done today at nephrologist office. Cbc, bmp done but no bnp done.  Pt states nephrologist had plans to contact cardiologist office. Dr. Jeffie Pollock. Last visit with him was virtual at end of October. On review it appears torsemide was used 60 mg tid. Rather than lasix which patient state has been using. He had showed me that he had been using lasix on last visit. Today after review of chart did not see when he was told to take lasix. On today recheck of bottle lasix was written in May.   Reviewed cxr and labs with pt done last week. Mild bnp increase. Mild edema on cxr. O O@ sat 100% room air initially but rechecked and actually 97%.  Opacity on last xray but no fever, no chills or sweats. No cough.  Review of Systems  Constitutional: Negative for chills, fatigue and fever.  HENT: Negative for congestion, ear discharge and ear pain.   Respiratory: Negative for choking, chest tightness, shortness of breath and wheezing.   Cardiovascular: Negative for chest pain and palpitations.  Gastrointestinal: Negative for abdominal distention and anal bleeding.  Musculoskeletal: Negative for back pain.       Pedal edema.  Skin: Negative for rash.  Psychiatric/Behavioral: Negative for behavioral problems, confusion and dysphoric mood. The patient is not nervous/anxious.        Objective:   Physical Exam  General Mental Status- Alert. General Appearance- Not in acute distress.   Skin General: Color- Normal Color. Moisture- Normal Moisture.  Neck Carotid Arteries- Normal color. Moisture- Normal Moisture. No carotid bruits. No JVD.  Chest and Lung Exam Auscultation: Breath Sounds:-Normal. Even unlabored(at 45  degree angle reclined o2 sat 97%  Cardiovascular Auscultation:Rythm- Regular. Murmurs & Other Heart Sounds:Auscultation of the heart reveals- No Murmurs.  Abdomen Inspection:-Inspeection Normal. Palpation/Percussion:Note:No mass. Palpation and Percussion of the abdomen reveal- Non Tender, Non Distended + BS, no rebound or guarding.   Neurologic Cranial Nerve exam:- CN III-XII intact(No nystagmus), symmetric smile. Strength:- 5/5 equal and symmetric strength both upper and lower extremities.   Lower ext- 3+ pedal edema lower ext ankle to upper calf. Symmetric bilaterally. I am not convinced edema up to thighs.     Assessment & Plan:  Your chest xray showed mild pedal edema, bnp was mild high and your weight has not dropped despite increasing lasix. Still still 227 lb from prior approximate 207 lb.  Will try to get labs faxed over from nephrologist with office note.  On review of records and last rx Dr.  Jeffie Pollock gave you were on torsemide 60 mg tid. Advise going back to torsemide and getting repeat chest xray.  Regarding torsemide it appears that you have active presciption and refills. Go back to torsemide as Dr. Haroldine Laws advised on oct rx. Giving rx as print if unable to fill old rx. Stop lasix presently.  Will send Dr. Haroldine Laws note asking direction in light of cxr, bnp and severe pedal edema.   Discussed with Dr. Randel Pigg and she agrees with tx plan.  40 minutes spent with pt. 50% of time spent counseling pt on plan going forward.

## 2019-10-18 NOTE — Patient Instructions (Addendum)
Your chest xray showed mild pedal edema, bnp was mild high and your weight has not dropped despite increasing lasix. Still still 227 lb from prior approximate 207 lb.  Will try to get labs faxed over from nephrologist with office note.  On review of records and last rx  Dr. Jeffie Pollock gave you were on torsemide 60 mg tid. Advise going back to torsemide and getting repeat chest xray.  Regarding torsemide it appears that you have active presciption and refills. Go back to torsemide as Dr. Phillip Heal advised on oct rx. Giving rx as print if unable to fill old rx. Stop lasix.  Will send Dr. Haroldine Laws note asking direction in light of cxr, bnp and severe pedal edema.   Discussed with Dr. Randel Pigg and she agrees with tx plan.

## 2019-10-24 ENCOUNTER — Ambulatory Visit (HOSPITAL_COMMUNITY)
Admission: RE | Admit: 2019-10-24 | Discharge: 2019-10-24 | Disposition: A | Discharge status: Home / Self Care | Payer: Medicare Other | Source: Ambulatory Visit | Attending: Internal Medicine | Admitting: Internal Medicine

## 2019-10-24 ENCOUNTER — Other Ambulatory Visit: Payer: Self-pay

## 2019-10-24 DIAGNOSIS — I2721 Secondary pulmonary arterial hypertension: Secondary | ICD-10-CM

## 2019-10-24 DIAGNOSIS — I2729 Other secondary pulmonary hypertension: Secondary | ICD-10-CM

## 2019-10-24 DIAGNOSIS — I50810 Right heart failure, unspecified: Secondary | ICD-10-CM

## 2019-10-24 DIAGNOSIS — I5032 Chronic diastolic (congestive) heart failure: Secondary | ICD-10-CM | POA: Diagnosis not present

## 2019-10-24 DIAGNOSIS — N1832 Chronic kidney disease, stage 3b: Secondary | ICD-10-CM

## 2019-10-24 DIAGNOSIS — K766 Portal hypertension: Secondary | ICD-10-CM

## 2019-10-24 NOTE — Progress Notes (Signed)
Heart Failure TeleHealth Note  Due to national recommendations of social distancing due to [facility] 19, Audio/video telehealth visit is felt to be most appropriate for this patient at this time.  See MyChart message from today for patient consent regarding telehealth for [facility]  Date:  10/24/2019   ID:  Darren Mueller, DOB 03-26-1951, MRN [id number]  Location: Home  Provider location: [facility] Type of Visit: Established patient  PCP:  Darren Lukes, MD  Cardiologist:  Darren Bickers, MD Primary HF: Darren Mueller  Chief Complaint: Heart Failure follow-up   History of Present Illness:  Darren Mueller ("the Darren Mueller") is a 68yo with history of COPD, portopulmonary hypertension with RV failure, and ETOH cirrhosis.  Admitted in 6/17 with CP and SOB. Underwent R/L cath. Minimal CAD with moderate PAH and preserved cardiac output.   Admitted 06/2016 and 07/2016 with syncope. On September admission found to have elevated ETOH level as well as R 6th rib fracture. Wore 30 day monitor up until 08/28/16. No AF noted. No events.   Admitted February2018after syncopal event. LINQ placed.   Was admitted 4/20 with hematochezia and melena. His hemoglobin remained stable. No episodes of GI bleed after hospitalization. Underwent EGD by GI with finding of Grade 1 and a small esophageal varices, no evidence of recent bleeding,portal hypertensive gastropathy. Started on nadolol which made him very dizzy. He stopped this and feels a lot   Admitted  7/13-16/20 with volume overload. Diuresed 21 pounds. D/c weight 192.  Echo  05/17/19: LVEF > 65% RV severely dilated with moderate RV dysfunction. Moderate TR RVSP 63mHG. While in hospital was pancytopenic. Possibility of BMBx discussed with Dr. SAlen Blewbut felt it was not necessary as it was felt he had splenic sequestration and counts were low but very stable.  He presents via aEngineer, civil (consulting)for a  telehealth visit today. He has been struggling with fluid overload and we have been titrating torsemide and metolazone. Creatinine bumped to 2.75. Recently seen by Renal and his PCP and somehow he was back on lasix. This was transitioned back to torsemide. Weight now down from 225 -> 204 but legs still very swollen. Now taking torsemide 60/40. Says his legs have never been this swollen. Breathing ok. Able to do all ADLs without problem. No orthopnea or PND.    PAH meds 1) Macitnentan 10 mg daily 2) Adcirca 40 mg daily  3) Selexipeg 1000/1200 daily   Studies: ECHO 12/14 EF 55-60% Peak PA pressure 35. Severe RV dysfunction  ECHO 7/15 EF 60% RV moderately to severely dilated. Moderate HK RVSP 566mHG  ECHO 6/16 EF 60% RV moderately to severely dilated. Severe HK RVSP ~65 mm HG. D-shaped septum Echo 2/17 LVEF 60-65% RV massively dilated. Flat septum. Severe HK. Moderate TR RVSP ~65. IVC small. No effusion  ECHO 01/01/2017 EF 50-55% Grade I DD. RV severely dilated. Peak PA pressure 62 mm hg Echo 9/19 with EF 60-65% RV markedly dilated and hypokinetic with D-shaped septum. RVSP 6833m  Cath 6/17 Mid RCA lesion, 20% stenosed. Dist LAD lesion, 20% stenosed. Ao = 107/70 (87) LV = 106/3/11 RA = 4 RV = 74/11 PA = 74/26 (45) PCW = 6 Fick cardiac output/index = 4.6.2.2 PVR = 8.5 Ao sat = 95% PA sat = 61%, 58%  RHC 2/17 RA = 11 RV = 80/16/19 PA = 83/61 (71) PCW = 9 Fick cardiac output/index = 5.6/2.6 PVR = 10.3 WU Ao sat = 94% PA sat =  65%, 68% High SVC sat = 65%   PFTs 3/14 FEV1 2.02 L (58%) FVC 2.7L (54%) FEV1/FVC (89%) DLCO 53%  PFTs 1/17 FEV1 2.05 L (61%) FVC 2.48L (56%) DLCO 44%   Ab u/s 8/16: + cirrhosis/mild to moderate splenomegaly  6 min walk 01/10/13, 1290 feet  6MW (01/10/14) = 1280 feet (380 m) 6MW (9/15) = 1350 feet (411 m) O2 sats ranged from 89-95% on room air, HR ranged from 100-129. 6MW (6/16) = 384 meters  6MW (2/17) = 1250 feet (366m 6MW  (8/17) = 1320 feet    CLanier Ensigndenies symptoms worrisome for COVID 19.   Past Medical History:  Diagnosis Date  . Alcohol dependence ([facility] 03/22/2011   In remission   . Alcohol dependence in remission ([facility] 03/22/2011   In remission   . Alcoholic cirrhosis of liver with ascites (HD[facility] 2014  . Anginal pain ([facility]    with SOB admitted 04/20/2019  . Cardiomegaly 02/2019   Noted on CXR  . CHF (congestive heart failure) ([facility]   . CKD (chronic kidney disease), stage III 07/18/2013  . COPD (chronic obstructive pulmonary disease) ([facility]   . Coronary artery disease   . Dermatitis 06/10/2015  . Diarrhea 09/04/2013  . Dyspnea    admitted 04/20/2019  . Elbow pain, left 03/17/2017  . Eustachian tube dysfunction   . GERD (gastroesophageal reflux disease)   . Gout   . Headache   . Hearing loss   . Heart murmur   . Hx of colonic polyps   . Hypertension   . Hypertriglyceridemia 03/17/2017  . Hypopotassemia   . Otalgia 11/28/2013  . Other chronic pulmonary heart diseases   . PAH (pulmonary artery hypertension) (HCC)    RV failure  . Pancytopenia ([facility]   . Pleural effusion 2014   Noted on CT:   Moderately large simple layering right pleural effusion   . Preventative health care 06/29/2016  . Pulmonary nodule 2014   Noted on CT: An 8 mm pulmonary nodule in the periphery of the left lower lobe  . Rectal bleeding   . Red eye 03/03/2016   right  . Seizures ([facility]   . Sleep apnea    does not wear CPAP  . Splenomegaly 12/2015   Noted on CT  . Thoracic aorta atherosclerosis (HD[place] 12/2017  . Thrombocytopenia ([facility] 06/29/2016  . Unspecified hypothyroidism 01/25/2013  . Unspecified pleural effusion   . Vertigo   . Wears glasses    Past Surgical History:  Procedure Laterality Date  . CARDIAC CATHETERIZATION N/A 12/21/2015   Procedure: Right Heart Cath;  Surgeon: DJolaine Artist MD;  [facility];  Service: Cardiovascular;  Laterality: N/A;  . CARDIAC CATHETERIZATION N/A  04/15/2016   Procedure: Right/Left Heart Cath and Coronary Angiography;  Surgeon: DJolaine Artist MD;  [facility];  Service: Cardiovascular;  Laterality: N/A;  . COLONOSCOPY N/A 12/08/2013   Procedure: COLONOSCOPY;  Surgeon: DMilus Banister MD;  [facility];  Service: Endoscopy;  Laterality: N/A;  . COLONOSCOPY WITH PROPOFOL N/A 03/24/2019   Procedure: COLONOSCOPY WITH PROPOFOL;  Surgeon: JMilus Banister MD;  Location: [facility];  Service: Endoscopy;  Laterality: N/A;  Draw CBC in preop  . COLONOSCOPY WITH PROPOFOL N/A 04/28/2019   Procedure: COLONOSCOPY WITH PROPOFOL;  Surgeon: JMilus Banister MD;  Location: [facility];  Service: Endoscopy;  Laterality: N/A;  . ESOPHAGOGASTRODUODENOSCOPY (EGD) WITH PROPOFOL N/A 03/27/2016   Procedure: ESOPHAGOGASTRODUODENOSCOPY (EGD) WITH PROPOFOL;  Surgeon: Milus Banister, MD;  Location: [facility];  Service: Endoscopy;  Laterality: N/A;  . ESOPHAGOGASTRODUODENOSCOPY (EGD) WITH PROPOFOL N/A 02/14/2019   Procedure: ESOPHAGOGASTRODUODENOSCOPY (EGD) WITH PROPOFOL;  Surgeon: Jerene Bears, MD;  [facility] ENDOSCOPY;  Service: Gastroenterology;  Laterality: N/A;  . EYE SURGERY Bilateral 03/15/2019   lasix  . GAS/FLUID EXCHANGE Right 09/22/2019   Procedure: GAS/FLUID EXCHANGE (C3F8) RIGHT EYE;  Surgeon: Sherlynn Stalls, MD;  Location: [facility];  Service: Ophthalmology;  Laterality: Right;  . HEMOSTASIS CLIP PLACEMENT  04/28/2019   Procedure: HEMOSTASIS CLIP PLACEMENT;  Surgeon: Milus Banister, MD;  Location: [facility];  Service: Endoscopy;;  . LOOP RECORDER INSERTION N/A 01/01/2017   Procedure: Loop Recorder Insertion;  Surgeon: Thompson Grayer, MD;  Location: [facility];  Service: Cardiovascular;  Laterality: N/A;  . PARS PLANA VITRECTOMY Right 09/22/2019   Procedure: PARS PLANA VITRECTOMY WITH 25 GAUGE RIGHT EYE;  Surgeon: Sherlynn Stalls, MD;  Location: [place];  Service: Ophthalmology;  Laterality: Right;  .  PHOTOCOAGULATION WITH LASER Right 09/22/2019   Procedure: PHOTOCOAGULATION WITH LASER RIGHT EYE;  Surgeon: Sherlynn Stalls, MD;  Location: [facility];  Service: Ophthalmology;  Laterality: Right;  . POLYPECTOMY  04/28/2019   Procedure: POLYPECTOMY;  Surgeon: Milus Banister, MD;  Location[facility];  Service: Endoscopy;;  . RIGHT HEART CATHETERIZATION Right 06/06/2014   Procedure: RIGHT HEART CATH;  Surgeon: Jolaine Artist, MD;  Location: [facility] CATH LAB;  Service: Cardiovascular;  Laterality: Right;  . UVULOPALATOPHARYNGOPLASTY  1999     Current Outpatient Medications  Medication Sig Dispense Refill  . acetaminophen (TYLENOL) 500 MG tablet Take 1,000 mg by mouth every 6 (six) hours as needed (for pain.).    Marland Kitchen albuterol (VENTOLIN HFA) 108 (90 Base) MCG/ACT inhaler Inhale 2 puffs into the lungs every 6 (six) hours as needed for wheezing or shortness of breath.    . allopurinol (ZYLOPRIM) 100 MG tablet Take 2 tablets (200 mg total) by mouth daily. 180 tablet 1  . Blood Pressure Monitoring (BLOOD PRESSURE CUFF) MISC Use as directed. Check blood pressure bid prn Dx:I10 1 each 0  . colchicine 0.6 MG tablet Take 0.6 mg by mouth daily as needed (gout flare).    . Difluprednate (DUREZOL) 0.05 % EMUL Place 1 drop into the left eye daily.    . diphenoxylate-atropine (LOMOTIL) 2.5-0.025 MG tablet Take 1 tablet by mouth 3 (three) times daily. 60 tablet 0  . doxepin (SINEQUAN) 10 MG capsule Take 10-20 mg by mouth at bedtime.    . Ferrous Fumarate (HEMOCYTE) 324 (106 Fe) MG TABS tablet Take 1 tablet (106 mg of iron total) by mouth daily. 30 tablet 5  . fluticasone-salmeterol (ADVAIR HFA) 230-21 MCG/ACT inhaler Inhale 2 puffs into the lungs every morning. 48 g 1  . folic acid (FOLVITE) 1 MG tablet Take 1 tablet (1 mg total) by mouth daily. 30 tablet 5  . furosemide (LASIX) 40 MG tablet Take 40 mg by mouth 2 (two) times daily.    . hydrOXYzine (ATARAX/VISTARIL) 25 MG tablet Take 1 tablet (25 mg total) by mouth 3  (three) times daily as needed. 30 tablet 0  . hyoscyamine (LEVSIN SL) 0.125 MG SL tablet Place 1 tablet (0.125 mg total) under the tongue every 6 (six) hours as needed. 30 tablet 1  . macitentan (OPSUMIT) 10 MG tablet Take 1 tablet (10 mg total) by mouth daily. 90 tablet 3  . Magnesium Oxide (MAG-OXIDE) 200 MG TABS Take 2 tablets (400  mg total) by mouth daily. 180 tablet 3  . metolazone (ZAROXOLYN) 2.5 MG tablet Take 1 tablet (2.5 mg total) by mouth as directed. As directed by heart failure team 30 tablet 0  . mometasone-formoterol (DULERA) 100-5 MCG/ACT AERO Inhale 2 puffs into the lungs 2 (two) times daily.    . Multiple Vitamin (MULTIVITAMIN) tablet Take 1 tablet by mouth daily.    Marland Kitchen omeprazole (PRILOSEC) 40 MG capsule Take 40 mg by mouth daily.    . potassium chloride (KLOR-CON) 10 MEQ tablet Take 4 tablets (40 mEq total) by mouth 2 (two) times daily. 240 tablet 3  . rosuvastatin (CRESTOR) 5 MG tablet TAKE 1 TABLET DAILY 90 tablet 3  . Selexipag (UPTRAVI) 1000 MCG TABS Take 1,000 mcg by mouth 2 (two) times daily. 60 tablet 11  . tadalafil, PAH, (ADCIRCA) 20 MG tablet Take 2 tablets (40 mg total) by mouth daily. 90 tablet 1  . thiamine 100 MG tablet Take 1 tablet (100 mg total) by mouth daily. 30 tablet 5  . torsemide (DEMADEX) 20 MG tablet Take 3 tablets (60 mg total) by mouth every morning AND 2 tablets (40 mg total) every evening. 150 tablet 3   No current facility-administered medications for this encounter.    Allergies:   Aspirin and Nitroglycerin   Social History:  The patient  reports that he quit smoking about 40 years ago. His smoking use included cigarettes. He has a 10.00 pack-year smoking history. He has never used smokeless tobacco. He reports current alcohol use of about 6.0 standard drinks of alcohol per week. He reports that he does not use drugs.   Family History:  The patient's family history includes Alcoholism in his maternal grandfather; Cancer in his father; Gout in  his brother; Heart attack in his mother; Heart disease in his mother; Hypertension in his brother, brother, and mother; Kidney failure in his mother; Liver disease in his maternal grandfather and mother; Migraines in his sister; Prostate cancer in his father.   ROS:  Please see the history of present illness.   All other systems are personally reviewed and negative.   Exam:  (Video/Tele Health Call; Exam is subjective and or/visual.) General:  Speaks in full sentences. No resp difficulty. Lungs: Normal respiratory effort with conversation.  Abdomen: distended per patient report Extremities: pt endorses severe edema. Wearing hose Neuro: Alert & oriented x 3.   Recent Labs: 05/16/2019: B Natriuretic Peptide 1,119.2 06/02/2019: TSH 2.50 07/20/2019: Magnesium 1.7 09/22/2019: Hemoglobin 11.2; Platelets 27 10/11/2019: ALT 16; BUN 24; Creatinine, Ser 1.53; Potassium 3.9; Pro B Natriuretic peptide (BNP) 604.0; Sodium 137  Personally reviewed   Wt Readings from Last 3 Encounters:  10/18/19 103 kg (227 lb)  10/11/19 103.1 kg (227 lb 3.2 oz)  09/22/19 94.8 kg (208 lb 15.9 oz)      ASSESSMENT AND PLAN:  1. PAH: Suspected portopulmonary HTN in setting of ETOH cirrhosis. - Remains nYHA III now with worsening R-sided HF and volume overload. Diuresis as below.  -6MW 9/19 1280 feet (390 meters) - Echo 9/19 with EF 60-65% RV markedly dilated and hypokinetic with D-shaped septum - Echo  05/17/19: LVEF > 65% RV severely dilated with moderate RV dysfunction. Septal flattening Moderate TR RVSP 44mHG. - Continue selexipag 1000 mcg twice a day. Intolerant higher dose due to diarrhea and headaches .  - Continue macitentan 10 daily and adcirca 40 mg daily. - With RV strain on echo may need to consider repeat RHC and  IV therapies in  the near future.     2. Acute on chronic diastolic HF - Continues to struggle with volume overload and cardiorenal syndrome (baseline weight likely closer to 190-195). He is  203 but further diuresis limited by AKI - Now back on torsemide 60/40. Feels like weight is coming down but still ery swollen.  - Will given metolazone 2.5 daily for 2 days with 40 kcl each time. Recheck labs on Thursday - If no improvement may need admission for IV diurese - Continue  KCL to 40 bid - f/u televist 3 weeks  3. Acute CKD III:  -Baseline creatinine 1.7-2.0 - Followed by Dr. Marval Regal  - Change diuretics as above. Repeat labs Thrusday  4. Hypokalemia - BMET Thursday  5. Cirrhosis:  - Likely combination of RV failure and ETOH - Follows with Dr. Ardis Hughs. - Did not tolerate nadolol.  - EGD 4/20 stable - Stressed need for ETOH cessation  6. Recurrent syncope/seizure - LINQ interrogations have shown no arrhythmias and thus most likely seizure - Continue Keppra. Follows with Dr. Delice Lesch  7. HTN:  -.Blood pressure well controlled. Continue current regimen.  8. OSA:  - Has mild OSA with AHI 12 and desats down to 82%.  - Intolerant CPAP.   9. Pancytopenia:  - Has seen Dr. Alen Blew in Hematology - felt like it is due to splenic sequestration.  - No role for BMBx current per Dr. Alen Blew  10. Hypomag - continue magOx 400 daily  COVID screen The patient does not have any symptoms that suggest any further testing/ screening at this time. Social distancing reinforced today.  Patient Risk: After full review of this patients clinical status, I feel that they are at moderate risk for cardiac decompensation at this time.  Relevant cardiac medications were reviewed at length with the patient today. The patient does not have concerns regarding their medications at this time.   Recommended follow-up:2-3 week televisit   Today, I have spent16 minutes with the patient with telehealth technology discussing the above issues .   Signed, Darren Bickers, MD  10/24/2019 2:07 PM  [facility] [place] [address] [facility] [place] [address] [phone] (office) [phone] (fax)

## 2019-10-25 MED ORDER — METOLAZONE 2.5 MG PO TABS: 2.5000 mg | ORAL_TABLET | Freq: Every day | ORAL | 2 refills | Status: DC | PRN

## 2019-10-25 MED ORDER — POTASSIUM CHLORIDE ER 10 MEQ PO TBCR: 40.0000 meq | EXTENDED_RELEASE_TABLET | Freq: Two times a day (BID) | ORAL | 3 refills | Status: DC

## 2019-10-25 MED FILL — metOLazone 2.5 MG TABS: 2.5 | 30 days supply | Qty: 30 | Fill #0

## 2019-10-25 NOTE — Progress Notes (Signed)
ILR Remote

## 2019-10-25 NOTE — Progress Notes (Signed)
Spoke w/pt via phone and reviewed instructions, he is aware, agreeable and verbalized understanding.  RX sent in, lab sch 12/24, f/u visit 1/4

## 2019-10-25 NOTE — Addendum Note (Signed)
Encounter addended by: Scarlette Calico, RN on: 10/25/2019 3:26 PM  Actions taken: Clinical Note Signed, Pharmacy for encounter modified, Order list changed, Diagnosis association updated

## 2019-10-27 ENCOUNTER — Other Ambulatory Visit: Payer: Self-pay

## 2019-10-27 ENCOUNTER — Telehealth (HOSPITAL_COMMUNITY): Payer: Self-pay

## 2019-10-27 ENCOUNTER — Encounter (HOSPITAL_COMMUNITY): Payer: Self-pay

## 2019-10-27 ENCOUNTER — Ambulatory Visit (HOSPITAL_COMMUNITY)
Admission: RE | Admit: 2019-10-27 | Discharge: 2019-10-27 | Disposition: A | Discharge status: Home / Self Care | Payer: Medicare Other | Source: Ambulatory Visit | Attending: Internal Medicine | Admitting: Internal Medicine

## 2019-10-27 DIAGNOSIS — I5032 Chronic diastolic (congestive) heart failure: Secondary | ICD-10-CM

## 2019-10-27 LAB — BASIC METABOLIC PANEL
Anion gap: 13 (ref 5–15)
BUN: 44 mg/dL — ABNORMAL HIGH (ref 8–23)
CO2: 25 mmol/L (ref 22–32)
Calcium: 8.3 mg/dL — ABNORMAL LOW (ref 8.9–10.3)
Chloride: 102 mmol/L (ref 98–111)
Creatinine, Ser: 2.51 mg/dL — ABNORMAL HIGH (ref 0.61–1.24)
GFR calc Af Amer: 29 mL/min — ABNORMAL LOW (ref >60)
GFR calc non Af Amer: 25 mL/min — ABNORMAL LOW (ref >60)
Glucose, Bld: 117 mg/dL — ABNORMAL HIGH (ref 70–99)
Potassium: 3 mmol/L — ABNORMAL LOW (ref 3.5–5.1)
Sodium: 140 mmol/L (ref 135–145)

## 2019-10-27 NOTE — Telephone Encounter (Signed)
-----  Message from Jolaine Artist, MD sent at 10/27/2019 12:52 PM EST ----- Take extra 40 kcl. Repeat BMET on Monday

## 2019-10-31 ENCOUNTER — Ambulatory Visit (INDEPENDENT_AMBULATORY_CARE_PROVIDER_SITE_OTHER): Payer: Medicare Other | Admitting: *Deleted

## 2019-10-31 DIAGNOSIS — I129 Hypertensive chronic kidney disease with stage 1 through stage 4 chronic kidney disease, or unspecified chronic kidney disease: Secondary | ICD-10-CM | POA: Diagnosis not present

## 2019-10-31 DIAGNOSIS — R55 Syncope and collapse: Secondary | ICD-10-CM | POA: Diagnosis not present

## 2019-10-31 LAB — CUP PACEART REMOTE DEVICE CHECK
Date Time Interrogation Session: 20201228112755
Implantable Pulse Generator Implant Date: 20180301

## 2019-11-04 ENCOUNTER — Other Ambulatory Visit: Payer: Self-pay | Admitting: Family Medicine

## 2019-11-07 ENCOUNTER — Other Ambulatory Visit: Payer: Self-pay

## 2019-11-07 ENCOUNTER — Ambulatory Visit (HOSPITAL_COMMUNITY)
Admission: RE | Admit: 2019-11-07 | Discharge: 2019-11-07 | Disposition: A | Discharge status: Home / Self Care | Payer: Medicare Other | Source: Ambulatory Visit | Attending: Internal Medicine | Admitting: Internal Medicine

## 2019-11-07 DIAGNOSIS — I5032 Chronic diastolic (congestive) heart failure: Secondary | ICD-10-CM

## 2019-11-07 DIAGNOSIS — I2729 Other secondary pulmonary hypertension: Secondary | ICD-10-CM | POA: Diagnosis not present

## 2019-11-07 DIAGNOSIS — E876 Hypokalemia: Secondary | ICD-10-CM

## 2019-11-07 DIAGNOSIS — I2721 Secondary pulmonary arterial hypertension: Secondary | ICD-10-CM

## 2019-11-07 DIAGNOSIS — K766 Portal hypertension: Secondary | ICD-10-CM

## 2019-11-07 DIAGNOSIS — N183 Chronic kidney disease, stage 3 unspecified: Secondary | ICD-10-CM

## 2019-11-07 NOTE — Progress Notes (Signed)
Heart Failure TeleHealth Note  Due to national recommendations of social distancing due to COVID 19, Audio/video telehealth visit is felt to be most appropriate for this patient at this time.  See MyChart message from today for patient consent regarding telehealth for [facility].  Date:  11/07/2019   ID:  Darren Mueller, DOB [birth year], MRN [id number]  Location: Home  Provider location: [facility] Type of Visit: Established patient  PCP:  [name], MD  Cardiologist:  [name], MD Primary HF: [name]  Chief Complaint: Heart Failure follow-up   History of Present Illness:  [name] ("[name]") is a 69yo with history of COPD, portopulmonary hypertension with RV failure, and ETOH cirrhosis.  Admitted in 6/17 with CP and SOB. Underwent R/L cath. Minimal CAD with moderate PAH and preserved cardiac output.   Admitted 06/2016 and 07/2016 with syncope. On September admission found to have elevated ETOH level as well as R 6th rib fracture. Wore 30 day monitor up until 08/28/16. No AF noted. No events.   Admitted February2018after syncopal event. LINQ placed.   Was admitted 4/20 with hematochezia and melena. His hemoglobin remained stable. No episodes of GI bleed after hospitalization. Underwent EGD by GI with finding of Grade 1 and a small esophageal varices, no evidence of recent bleeding,portal hypertensive gastropathy. Started on nadolol which made him very dizzy. He stopped this and feels a lot   Admitted  7/13-16/20 with volume overload. Diuresed 21 pounds. D/c weight 192.  Echo  05/17/19: LVEF > 65% RV severely dilated with moderate RV dysfunction. Moderate TR RVSP . While in hospital was pancytopenic. Possibility of BMBx discussed with Dr. Clelia Croft but felt it was not necessary as it was felt he had splenic sequestration and counts were low but very stable.  He presents via [employer] for a  telehealth visit today. He has been struggling with fluid overload and we have been titrating torsemide and metolazone. Creatinine bumped to 2.75. Somehow he switched himself back to furosemide so we put him back on torsemide 60/40. Prior to Xmas I gave him metolazone for a couple days. He actually stopped torsemide and now only taking metolazone. Weight went down from 225 -> 204 but now back to 214. Says legs are swollen but less so. + dyspnea on just mild exertion. No orthopnea or PND.   PAH meds 1) Macitnentan 10 mg daily 2) Adcirca 40 mg daily  3) Selexipeg 1000/1200 daily   Studies: ECHO 12/14 EF 55-60% Peak PA pressure 35. Severe RV dysfunction  ECHO 7/15 EF 60% RV moderately to severely dilated. Moderate HK RVSP 57mm HG  ECHO 6/16 EF 60% RV moderately to severely dilated. Severe HK RVSP ~65 mm HG. D-shaped septum Echo 2/17 LVEF 60-65% RV massively dilated. Flat septum. Severe HK. Moderate TR RVSP ~65. IVC small. No effusion  ECHO 01/01/2017 EF 50-55% Grade I DD. RV severely dilated. Peak PA pressure 62 mm hg Echo 9/19 with EF 60-65% RV markedly dilated and hypokinetic with D-shaped septum. RVSP  Cath 6/17 Mid RCA lesion, 20% stenosed. Dist LAD lesion, 20% stenosed. Ao = 107/70 (87) LV = 106/3/11 RA = 4 RV = 74/11 PA = 74/26 (45) PCW = 6 Fick cardiac output/index = 4.6.2.2 PVR = 8.5 Ao sat = 95% PA sat = 61%, 58%  RHC 2/17 RA = 11 RV = 80/16/19 PA = 83/61 (71) PCW = 9 Fick cardiac output/index = 5.6/2.6 PVR = 10.3 WU  Ao sat = 94% PA sat = 65%, 68% High SVC sat = 65%   PFTs 3/14 FEV1 2.02 L (58%) FVC 2.7L (54%) FEV1/FVC (89%) DLCO 53%  PFTs 1/17 FEV1 2.05 L (61%) FVC 2.48L (56%) DLCO 44%   Ab u/s 8/16: + cirrhosis/mild to moderate splenomegaly  6 min walk 01/10/13, 1290 feet  (01/10/14) = 1280 feet (380 m) (9/15) = 1350 feet (411 m) O2 sats ranged from 89-95% on room air, HR ranged from 100-129. (6/16) = 384 meters   (2/17) = 1250 feet (378m) (8/17) = 1320 feet    Darren Mueller denies symptoms worrisome for COVID 19.   Past Medical History:  Diagnosis Date  . Alcohol dependence (HCC) 03/22/2011   In remission   . Alcohol dependence in remission (HCC) 03/22/2011   In remission   . Alcoholic cirrhosis of liver with ascites (HCC) 2014  . Anginal pain (HCC)    with SOB admitted 04/20/2019  . Cardiomegaly 02/2019   Noted on CXR  . CHF (congestive heart failure) (HCC)   . CKD (chronic kidney disease), stage III 07/18/2013  . COPD (chronic obstructive pulmonary disease) (HCC)   . Coronary artery disease   . Dermatitis 06/10/2015  . Diarrhea 09/04/2013  . Dyspnea    admitted 04/20/2019  . Elbow pain, left 03/17/2017  . Eustachian tube dysfunction   . GERD (gastroesophageal reflux disease)   . Gout   . Headache   . Hearing loss   . Heart murmur   . Hx of colonic polyps   . Hypertension   . Hypertriglyceridemia 03/17/2017  . Hypopotassemia   . Otalgia 11/28/2013  . Other chronic pulmonary heart diseases   . PAH (pulmonary artery hypertension) (HCC)    RV failure  . Pancytopenia (HCC)   . Pleural effusion 2014   Noted on CT:   Moderately large simple layering right pleural effusion   . Preventative health care 06/29/2016  . Pulmonary nodule 2014   Noted on CT: An 8 mm pulmonary nodule in the periphery of the left lower lobe  . Rectal bleeding   . Red eye 03/03/2016   right  . Seizures (HCC)   . Sleep apnea    does not wear CPAP  . Splenomegaly 12/2015   Noted on CT  . Thoracic aorta atherosclerosis (HCC) 12/2017  . Thrombocytopenia (HCC) 06/29/2016  . Unspecified hypothyroidism 01/25/2013  . Unspecified pleural effusion   . Vertigo   . Wears glasses    Past Surgical History:  Procedure Laterality Date  . CARDIAC CATHETERIZATION N/A 12/21/2015   Procedure: Right Heart Cath;  Surgeon: Dolores Patty, MD;  Location: [facility] INVASIVE CV LAB;  Service: Cardiovascular;  Laterality: N/A;  .  CARDIAC CATHETERIZATION N/A 04/15/2016   Procedure: Right/Left Heart Cath and Coronary Angiography;  Surgeon: Dolores Patty, MD;  Location: [facility] INVASIVE CV LAB;  Service: Cardiovascular;  Laterality: N/A;  . COLONOSCOPY N/A 12/08/2013   Procedure: COLONOSCOPY;  Surgeon: Rachael Fee, MD;  Location: [facility] ENDOSCOPY;  Service: Endoscopy;  Laterality: N/A;  . COLONOSCOPY WITH PROPOFOL N/A 03/24/2019   Procedure: COLONOSCOPY WITH PROPOFOL;  Surgeon: Rachael Fee, MD;  Location: [facility] ENDOSCOPY;  Service: Endoscopy;  Laterality: N/A;  Draw CBC in preop  . COLONOSCOPY WITH PROPOFOL N/A 04/28/2019   Procedure: COLONOSCOPY WITH PROPOFOL;  Surgeon: Rachael Fee, MD;  Location: [facility] ENDOSCOPY;  Service: Endoscopy;  Laterality: N/A;  . ESOPHAGOGASTRODUODENOSCOPY (EGD) WITH PROPOFOL N/A 03/27/2016  Procedure: ESOPHAGOGASTRODUODENOSCOPY (EGD) WITH PROPOFOL;  Surgeon: Rachael Fee, MD;  Location[facility] ENDOSCOPY;  Service: Endoscopy;  Laterality: N/A;  . ESOPHAGOGASTRODUODENOSCOPY (EGD) WITH PROPOFOL N/A 02/14/2019   Procedure: ESOPHAGOGASTRODUODENOSCOPY (EGD) WITH PROPOFOL;  Surgeon: Beverley Fiedler, MD;  Location: [facility] ENDOSCOPY;  Service: Gastroenterology;  Laterality: N/A;  . EYE SURGERY Bilateral 03/15/2019   lasix  . GAS/FLUID EXCHANGE Right 09/22/2019   Procedure: GAS/FLUID EXCHANGE (C3F8) RIGHT EYE;  Surgeon: Stephannie Li, MD;  Location[facility] OR;  Service: Ophthalmology;  Laterality: Right;  . HEMOSTASIS CLIP PLACEMENT  04/28/2019   Procedure: HEMOSTASIS CLIP PLACEMENT;  Surgeon: Rachael Fee, MD;  Location: [facility] ENDOSCOPY;  Service: Endoscopy;;  . LOOP RECORDER INSERTION N/A 01/01/2017   Procedure: Loop Recorder Insertion;  Surgeon: Hillis Range, MD;  Location: [facility] INVASIVE CV LAB;  Service: Cardiovascular;  Laterality: N/A;  . PARS PLANA VITRECTOMY Right 09/22/2019   Procedure: PARS PLANA VITRECTOMY WITH 25 GAUGE RIGHT EYE;  Surgeon: Stephannie Li, MD;  Location: [facility] OR;  Service: Ophthalmology;   Laterality: Right;  . PHOTOCOAGULATION WITH LASER Right 09/22/2019   Procedure: PHOTOCOAGULATION WITH LASER RIGHT EYE;  Surgeon: Stephannie Li, MD;  Location: [facility] OR;  Service: Ophthalmology;  Laterality: Right;  . POLYPECTOMY  04/28/2019   Procedure: POLYPECTOMY;  Surgeon: Rachael Fee, MD;  Location: [facility] ENDOSCOPY;  Service: Endoscopy;;  . RIGHT HEART CATHETERIZATION Right 06/06/2014   Procedure: RIGHT HEART CATH;  Surgeon: Dolores Patty, MD;  Location: [facility] CATH LAB;  Service: Cardiovascular;  Laterality: Right;  . UVULOPALATOPHARYNGOPLASTY  1999     Current Outpatient Medications  Medication Sig Dispense Refill  . acetaminophen (TYLENOL) 500 MG tablet Take 1,000 mg by mouth every 6 (six) hours as needed (for pain.).    Marland Kitchen ADVAIR HFA 230-21 MCG/ACT inhaler USE 2 INHALATIONS EVERY MORNING 48 g 5  . albuterol (VENTOLIN HFA) 108 (90 Base) MCG/ACT inhaler Inhale 2 puffs into the lungs every 6 (six) hours as needed for wheezing or shortness of breath.    . allopurinol (ZYLOPRIM) 100 MG tablet Take 2 tablets (200 mg total) by mouth daily. 180 tablet 1  . Blood Pressure Monitoring (BLOOD PRESSURE CUFF) MISC Use as directed. Check blood pressure bid prn Dx:I10 1 each 0  . colchicine 0.6 MG tablet Take 0.6 mg by mouth daily as needed (gout flare).    . Difluprednate (DUREZOL) 0.05 % EMUL Place 1 drop into the left eye daily.    . diphenoxylate-atropine (LOMOTIL) 2.5-0.025 MG tablet Take 1 tablet by mouth 3 (three) times daily. 60 tablet 0  . doxepin (SINEQUAN) 10 MG capsule Take 10-20 mg by mouth at bedtime.    . Ferrous Fumarate (HEMOCYTE) 324 (106 Fe) MG TABS tablet Take 1 tablet (106 mg of iron total) by mouth daily. 30 tablet 5  . folic acid (FOLVITE) 1 MG tablet Take 1 tablet (1 mg total) by mouth daily. 30 tablet 5  . furosemide (LASIX) 40 MG tablet Take 40 mg by mouth 2 (two) times daily.    . hydrOXYzine (ATARAX/VISTARIL) 25 MG tablet Take 1 tablet (25 mg total) by mouth 3 (three) times  daily as needed. 30 tablet 0  . hyoscyamine (LEVSIN SL) 0.125 MG SL tablet Place 1 tablet (0.125 mg total) under the tongue every 6 (six) hours as needed. 30 tablet 1  . macitentan (OPSUMIT) 10 MG tablet Take 1 tablet (10 mg total) by mouth daily. 90 tablet 3  . Magnesium Oxide (MAG-OXIDE) 200 MG TABS Take 2  tablets (400 mg total) by mouth daily. 180 tablet 3  . metolazone (ZAROXOLYN) 2.5 MG tablet Take 1 tablet (2.5 mg total) by mouth daily as needed. As directed by heart failure team 30 tablet 2  . mometasone-formoterol (DULERA) 100-5 MCG/ACT AERO Inhale 2 puffs into the lungs 2 (two) times daily.    . Multiple Vitamin (MULTIVITAMIN) tablet Take 1 tablet by mouth daily.    Marland Kitchen omeprazole (PRILOSEC) 40 MG capsule Take 40 mg by mouth daily.    . potassium chloride (KLOR-CON) 10 MEQ tablet Take 4 tablets (40 mEq total) by mouth 2 (two) times daily. Take an extra 2 tabs when you take metolazone 240 tablet 3  . rosuvastatin (CRESTOR) 5 MG tablet TAKE 1 TABLET DAILY 90 tablet 3  . Selexipag (UPTRAVI) 1000 MCG TABS Take 1,000 mcg by mouth 2 (two) times daily. 60 tablet 11  . tadalafil, PAH, (ADCIRCA) 20 MG tablet Take 2 tablets (40 mg total) by mouth daily. 90 tablet 1  . thiamine 100 MG tablet Take 1 tablet (100 mg total) by mouth daily. 30 tablet 5  . torsemide (DEMADEX) 20 MG tablet Take 3 tablets (60 mg total) by mouth every morning AND 2 tablets (40 mg total) every evening. 150 tablet 3   No current facility-administered medications for this encounter.    Allergies:   Aspirin and Nitroglycerin   Social History:  The patient  reports that he quit smoking about 40 years ago. His smoking use included cigarettes. He has a 10.00 pack-year smoking history. He has never used smokeless tobacco. He reports current alcohol use of about 6.0 standard drinks of alcohol per week. He reports that he does not use drugs.   Family History:  The patient's family history includes Alcoholism in his maternal  grandfather; Cancer in his father; Gout in his brother; Heart attack in his mother; Heart disease in his mother; Hypertension in his brother, brother, and mother; Kidney failure in his mother; Liver disease in his maternal grandfather and mother; Migraines in his sister; Prostate cancer in his father.   ROS:  Please see the history of present illness.   All other systems are personally reviewed and negative.   Exam:  (Video/Tele Health Call; Exam is subjective and or/visual.) General:  Speaks in full sentences. No resp difficulty. Lungs: Normal respiratory effort with conversation.  Abdomen: distended per patient report Extremities: pt endorses moderate edema. Says he is wearing compression stockings. . Wearing hose Neuro: Alert & oriented x 3.   Recent Labs: 05/16/2019: B Natriuretic Peptide 1,119.2 06/02/2019: TSH 2.50 07/20/2019: Magnesium 1.7 09/22/2019: Hemoglobin 11.2; Platelets 27 10/11/2019: ALT 16; Pro B Natriuretic peptide (BNP) 604.0 10/27/2019: BUN 44; Creatinine, Ser 2.51; Potassium 3.0; Sodium 140  Personally reviewed   Wt Readings from Last 3 Encounters:  10/18/19 103 kg (227 lb)  10/11/19 103.1 kg (227 lb 3.2 oz)  09/22/19 94.8 kg (208 lb 15.9 oz)      ASSESSMENT AND PLAN:  1. PAH: Suspected portopulmonary HTN in setting of ETOH cirrhosis. - Remains NYHA III now with worsening R-sided HF and volume overload in setting of ongoing inability to manage his own diuretic regimen - 9/19 1280 feet (390 meters) - Echo 9/19 with EF 60-65% RV markedly dilated and hypokinetic with D-shaped septum - Echo  05/17/19: LVEF > 65% RV severely dilated with moderate RV dysfunction. Septal flattening Moderate TR RVSP . - Continue selexipag 1000 mcg twice a day. Intolerant higher dose due to diarrhea and headaches .  -  Continue macitentan 10 daily and adcirca 40 mg daily. - With RV strain on echo may need to consider repeat RHC and  IV therapies in the near future but doubt he can  handle the complexity of this   2. Acute on chronic diastolic HF - Continues to struggle with volume overload and cardiorenal syndrome (baseline weight likely closer to 190-195). He is 203 but further diuresis limited by AKI - Now supposed to be back on torsemide 60/40 but when we added metolazone he stopped torsemide (again) - Will restart torsemide 60 bid. No metolazone for now. Check labs tomorrow.  - Repeat televist next week. Suspect may need admit to get him straightened out though he is reluctant  - Increase KCL to 60 bid - f/u televist 1 week - Extensive discussion on how to handle diuretic regimen.  3. Acute CKD III:  -Baseline creatinine 1.7-2.0. Was 2.5 on 10/27/19 - Followed by Dr. Arrie Aran  - Change diuretics as above. Repeat labs tomorrow  4. Hypokalemia - BMET tomorrow  5. Cirrhosis:  - Likely combination of RV failure and ETOH - Follows with Dr. Christella Hartigan. - Did not tolerate nadolol.  - EGD 4/20 stable - Stressed need for ETOH cessation  6. Recurrent syncope/seizure - LINQ interrogations have shown no arrhythmias and thus most likely seizure - Continue Keppra. Follows with Dr. Karel Jarvis  7. HTN:  -.Blood pressure well controlled. Continue current regimen.  8. OSA:  - Has mild OSA with AHI 12 and desats down to 82%.  - Intolerant CPAP.   9. Pancytopenia:  - Has seen Dr. Clelia Croft in Hematology - felt like it is due to splenic sequestration.  - No role for BMBx current per Dr. Clelia Croft  10. Hypomag - continue magOx 400 daily  COVID screen The patient does not have any symptoms that suggest any further testing/ screening at this time. Social distancing reinforced today.  Patient Risk: After full review of this patients clinical status, I feel that they are at moderate risk for cardiac decompensation at this time.  Relevant cardiac medications were reviewed at length with the patient today. The patient does not have concerns regarding their medications at  this time.   Recommended follow-up:1 week televisit   Today, I have spent22 minutes with the patient with telehealth technology discussing the above issues .   Signed, [name], MD  11/07/2019 11:47 AM  [facility] [address] Heart [facility] Vascular [place] [address] [phone] (office) [phone] (fax)

## 2019-11-11 ENCOUNTER — Telehealth (HOSPITAL_COMMUNITY): Payer: Self-pay

## 2019-11-11 MED ORDER — TADALAFIL (PAH) 20 MG PO TABS: 40.0000 mg | ORAL_TABLET | Freq: Every day | ORAL | 1 refills | Status: DC

## 2019-11-11 MED ORDER — TORSEMIDE 20 MG PO TABS: 60.0000 mg | ORAL_TABLET | Freq: Two times a day (BID) | ORAL | 5 refills | Status: DC

## 2019-11-11 MED ORDER — POTASSIUM CHLORIDE ER 20 MEQ PO TBCR: EXTENDED_RELEASE_TABLET | ORAL | 5 refills | Status: DC

## 2019-11-11 MED FILL — TORSEMIDE 20 MG TABLET: 20 | 30 days supply | Qty: 180 | Fill #0

## 2019-11-11 NOTE — Telephone Encounter (Signed)
-----  Message from Scarlette Calico, RN sent at 11/11/2019 12:11 PM EST ----- Marykay Lex, DB sent this to me differently and I didn't see it will you call pt and advise him and send in rx please, I think [facility] Leas already made him an appt for f/u. thanks ----- Message ----- From: Jolaine Artist, MD Sent: 11/07/2019  12:03 PM EST To: Scarlette Calico, RN  1. Stop metolazone 2. Restart torsemide was 60/40 (before he stopped it). Restart at 60 bid 3. Increase kcl to 60 bid 4. BMET tomorrow 5. Televisit 1 week 6. Needs tadalfil refilled through [facility]

## 2019-11-14 ENCOUNTER — Ambulatory Visit (HOSPITAL_COMMUNITY)
Admission: RE | Admit: 2019-11-14 | Discharge: 2019-11-14 | Disposition: A | Discharge status: Home / Self Care | Payer: Medicare Other | Source: Ambulatory Visit | Attending: Internal Medicine | Admitting: Internal Medicine

## 2019-11-14 ENCOUNTER — Other Ambulatory Visit: Payer: Self-pay | Admitting: Medical

## 2019-11-14 DIAGNOSIS — I5033 Acute on chronic diastolic (congestive) heart failure: Secondary | ICD-10-CM

## 2019-11-14 DIAGNOSIS — K766 Portal hypertension: Secondary | ICD-10-CM

## 2019-11-14 DIAGNOSIS — I50810 Right heart failure, unspecified: Secondary | ICD-10-CM

## 2019-11-14 DIAGNOSIS — I2729 Other secondary pulmonary hypertension: Secondary | ICD-10-CM

## 2019-11-14 DIAGNOSIS — N1832 Chronic kidney disease, stage 3b: Secondary | ICD-10-CM

## 2019-11-14 DIAGNOSIS — I2721 Secondary pulmonary arterial hypertension: Secondary | ICD-10-CM

## 2019-11-14 MED FILL — OMEPRAZOLE 40 MG CPDR: 40 | 90 days supply | Qty: 90 | Fill #0

## 2019-11-14 MED FILL — ALLOPURINOL 100 MG TABS: 100 | 90 days supply | Qty: 180 | Fill #1

## 2019-11-14 NOTE — Progress Notes (Signed)
Heart Failure TeleHealth Note  Due to national recommendations of social distancing due to COVID 19, Audio/video telehealth visit is felt to be most appropriate for this patient at this time.  See MyChart message from today for patient consent regarding telehealth for [facility].  Date:  11/14/2019   ID:  Darren Mueller, DOB 1951/07/13, MRN [id number]  Location: Home  Provider location: [facility] Type of Visit: Established patient  PCP:  [name], MD  Cardiologist:  [name], MD Primary HF: Shellie Goettl  Chief Complaint: Heart Failure follow-up   History of Present Illness:  Mr. Martus ("[name]") is a 69yo with history of COPD, portopulmonary hypertension with RV failure, and ETOH cirrhosis.  Admitted in 6/17 with CP and SOB. Underwent R/L cath. Minimal CAD with moderate PAH and preserved cardiac output.   Admitted 06/2016 and 07/2016 with syncope. On September admission found to have elevated ETOH level as well as R 6th rib fracture. Wore 30 day monitor up until 08/28/16. No AF noted. No events.   Admitted February2018after syncopal event. LINQ placed.   Was admitted 4/20 with hematochezia and melena. His hemoglobin remained stable. No episodes of GI bleed after hospitalization. Underwent EGD by GI with finding of Grade 1 and a small esophageal varices, no evidence of recent bleeding,portal hypertensive gastropathy. Started on nadolol which made him very dizzy. He stopped this and feels a lot   Admitted  7/13-16/20 with volume overload. Diuresed 21 pounds. D/c weight 192.  Echo  05/17/19: LVEF > 65% RV severely dilated with moderate RV dysfunction. Moderate TR RVSP . While in hospital was pancytopenic. Possibility of BMBx discussed with Dr. Clelia Croft but felt it was not necessary as it was felt he had splenic sequestration and counts were low but very stable.  He presents via Web designer for a  telehealth visit today. He has been struggling with fluid overload. Recently metolazone was added but he stopped torsemide and was only taking metolazone. Last week we restarted torsemide 60 bid (only took 40 bid). Says weight down 215->207. Still swollen but improving. SOB with mild exertion. No syncope or presyncope. Got extra torsemide to go to 60 bid today  PAH meds 1) Macitnentan 10 mg daily 2) Adcirca 40 mg daily  3) Selexipeg 1000/1200 daily   Studies: ECHO 12/14 EF 55-60% Peak PA pressure 35. Severe RV dysfunction  ECHO 7/15 EF 60% RV moderately to severely dilated. Moderate HK RVSP 57mm HG  ECHO 6/16 EF 60% RV moderately to severely dilated. Severe HK RVSP ~65 mm HG. D-shaped septum Echo 2/17 LVEF 60-65% RV massively dilated. Flat septum. Severe HK. Moderate TR RVSP ~65. IVC small. No effusion  ECHO 01/01/2017 EF 50-55% Grade I DD. RV severely dilated. Peak PA pressure 62 mm hg Echo 9/19 with EF 60-65% RV markedly dilated and hypokinetic with D-shaped septum. RVSP  Cath 6/17 Mid RCA lesion, 20% stenosed. Dist LAD lesion, 20% stenosed. Ao = 107/70 (87) LV = 106/3/11 RA = 4 RV = 74/11 PA = 74/26 (45) PCW = 6 Fick cardiac output/index = 4.6.2.2 PVR = 8.5 Ao sat = 95% PA sat = 61%, 58%  RHC 2/17 RA = 11 RV = 80/16/19 PA = 83/61 (71) PCW = 9 Fick cardiac output/index = 5.6/2.6 PVR = 10.3 WU Ao sat = 94% PA sat = 65%, 68% High SVC sat = 65%   PFTs 3/14 FEV1 2.02 L (58%) FVC 2.7L (54%) FEV1/FVC (89%) DLCO  53%  PFTs 1/17 FEV1 2.05 L (61%) FVC 2.48L (56%) DLCO 44%   Ab u/s 8/16: + cirrhosis/mild to moderate splenomegaly  6 min walk 01/10/13, 1290 feet  (01/10/14) = 1280 feet (380 m) (9/15) = 1350 feet (411 m) O2 sats ranged from 89-95% on room air, HR ranged from 100-129. (6/16) = 384 meters  (2/17) = 1250 feet (341m) (8/17) = 1320 feet    Darren Mueller denies symptoms worrisome for COVID 19.   Past Medical  History:  Diagnosis Date  . Alcohol dependence (HCC) 03/22/2011   In remission   . Alcohol dependence in remission (HCC) 03/22/2011   In remission   . Alcoholic cirrhosis of liver with ascites (HCC) 2014  . Anginal pain (HCC)    with SOB admitted 04/20/2019  . Cardiomegaly 02/2019   Noted on CXR  . CHF (congestive heart failure) (HCC)   . CKD (chronic kidney disease), stage III 07/18/2013  . COPD (chronic obstructive pulmonary disease) (HCC)   . Coronary artery disease   . Dermatitis 06/10/2015  . Diarrhea 09/04/2013  . Dyspnea    admitted 04/20/2019  . Elbow pain, left 03/17/2017  . Eustachian tube dysfunction   . GERD (gastroesophageal reflux disease)   . Gout   . Headache   . Hearing loss   . Heart murmur   . Hx of colonic polyps   . Hypertension   . Hypertriglyceridemia 03/17/2017  . Hypopotassemia   . Otalgia 11/28/2013  . Other chronic pulmonary heart diseases   . PAH (pulmonary artery hypertension) (HCC)    RV failure  . Pancytopenia (HCC)   . Pleural effusion 2014   Noted on CT:   Moderately large simple layering right pleural effusion   . Preventative health care 06/29/2016  . Pulmonary nodule 2014   Noted on CT: An 8 mm pulmonary nodule in the periphery of the left lower lobe  . Rectal bleeding   . Red eye 03/03/2016   right  . Seizures (HCC)   . Sleep apnea    does not wear CPAP  . Splenomegaly 12/2015   Noted on CT  . Thoracic aorta atherosclerosis (HCC) 12/2017  . Thrombocytopenia (HCC) 06/29/2016  . Unspecified hypothyroidism 01/25/2013  . Unspecified pleural effusion   . Vertigo   . Wears glasses    Past Surgical History:  Procedure Laterality Date  . CARDIAC CATHETERIZATION N/A 12/21/2015   Procedure: Right Heart Cath;  Surgeon: Dolores Patty, MD;  Location: [facility] INVASIVE CV LAB;  Service: Cardiovascular;  Laterality: N/A;  . CARDIAC CATHETERIZATION N/A 04/15/2016   Procedure: Right/Left Heart Cath and Coronary Angiography;  Surgeon: Dolores Patty,  MD;  Location: [facility] INVASIVE CV LAB;  Service: Cardiovascular;  Laterality: N/A;  . COLONOSCOPY N/A 12/08/2013   Procedure: COLONOSCOPY;  Surgeon: Rachael Fee, MD;  Location: [facility] ENDOSCOPY;  Service: Endoscopy;  Laterality: N/A;  . COLONOSCOPY WITH PROPOFOL N/A 03/24/2019   Procedure: COLONOSCOPY WITH PROPOFOL;  Surgeon: Rachael Fee, MD;  Location: [facility] ENDOSCOPY;  Service: Endoscopy;  Laterality: N/A;  Draw CBC in preop  . COLONOSCOPY WITH PROPOFOL N/A 04/28/2019   Procedure: COLONOSCOPY WITH PROPOFOL;  Surgeon: Rachael Fee, MD;  Location: [facility] ENDOSCOPY;  Service: Endoscopy;  Laterality: N/A;  . ESOPHAGOGASTRODUODENOSCOPY (EGD) WITH PROPOFOL N/A 03/27/2016   Procedure: ESOPHAGOGASTRODUODENOSCOPY (EGD) WITH PROPOFOL;  Surgeon: Rachael Fee, MD;  Location[facility] ENDOSCOPY;  Service: Endoscopy;  Laterality: N/A;  . ESOPHAGOGASTRODUODENOSCOPY (EGD) WITH PROPOFOL  N/A 02/14/2019   Procedure: ESOPHAGOGASTRODUODENOSCOPY (EGD) WITH PROPOFOL;  Surgeon: Beverley Fiedler, MD;  Location: [facility] ENDOSCOPY;  Service: Gastroenterology;  Laterality: N/A;  . EYE SURGERY Bilateral 03/15/2019   lasix  . GAS/FLUID EXCHANGE Right 09/22/2019   Procedure: GAS/FLUID EXCHANGE (C3F8) RIGHT EYE;  Surgeon: Stephannie Li, MD;  Location: [facility] OR;  Service: Ophthalmology;  Laterality: Right;  . HEMOSTASIS CLIP PLACEMENT  04/28/2019   Procedure: HEMOSTASIS CLIP PLACEMENT;  Surgeon: Rachael Fee, MD;  Location: [facility] ENDOSCOPY;  Service: Endoscopy;;  . LOOP RECORDER INSERTION N/A 01/01/2017   Procedure: Loop Recorder Insertion;  Surgeon: Hillis Range, MD;  Location: [facility] INVASIVE CV LAB;  Service: Cardiovascular;  Laterality: N/A;  . PARS PLANA VITRECTOMY Right 09/22/2019   Procedure: PARS PLANA VITRECTOMY WITH 25 GAUGE RIGHT EYE;  Surgeon: Stephannie Li, MD;  Location: [facility] OR;  Service: Ophthalmology;  Laterality: Right;  . PHOTOCOAGULATION WITH LASER Right 09/22/2019   Procedure: PHOTOCOAGULATION WITH LASER RIGHT EYE;  Surgeon:  Stephannie Li, MD;  Location[facility] OR;  Service: Ophthalmology;  Laterality: Right;  . POLYPECTOMY  04/28/2019   Procedure: POLYPECTOMY;  Surgeon: Rachael Fee, MD;  Location: [facility] ENDOSCOPY;  Service: Endoscopy;;  . RIGHT HEART CATHETERIZATION Right 06/06/2014   Procedure: RIGHT HEART CATH;  Surgeon: Dolores Patty, MD;  Location: [facility] CATH LAB;  Service: Cardiovascular;  Laterality: Right;  . UVULOPALATOPHARYNGOPLASTY  1999     Current Outpatient Medications  Medication Sig Dispense Refill  . acetaminophen (TYLENOL) 500 MG tablet Take 1,000 mg by mouth every 6 (six) hours as needed (for pain.).    Marland Kitchen ADVAIR HFA 230-21 MCG/ACT inhaler USE 2 INHALATIONS EVERY MORNING 48 g 5  . albuterol (VENTOLIN HFA) 108 (90 Base) MCG/ACT inhaler Inhale 2 puffs into the lungs every 6 (six) hours as needed for wheezing or shortness of breath.    . allopurinol (ZYLOPRIM) 100 MG tablet Take 2 tablets (200 mg total) by mouth daily. 180 tablet 1  . Blood Pressure Monitoring (BLOOD PRESSURE CUFF) MISC Use as directed. Check blood pressure bid prn Dx:I10 1 each 0  . colchicine 0.6 MG tablet Take 0.6 mg by mouth daily as needed (gout flare).    . Difluprednate (DUREZOL) 0.05 % EMUL Place 1 drop into the left eye daily.    . diphenoxylate-atropine (LOMOTIL) 2.5-0.025 MG tablet Take 1 tablet by mouth 3 (three) times daily. 60 tablet 0  . doxepin (SINEQUAN) 10 MG capsule Take 10-20 mg by mouth at bedtime.    . Ferrous Fumarate (HEMOCYTE) 324 (106 Fe) MG TABS tablet Take 1 tablet (106 mg of iron total) by mouth daily. 30 tablet 5  . folic acid (FOLVITE) 1 MG tablet Take 1 tablet (1 mg total) by mouth daily. 30 tablet 5  . hydrOXYzine (ATARAX/VISTARIL) 25 MG tablet Take 1 tablet (25 mg total) by mouth 3 (three) times daily as needed. 30 tablet 0  . hyoscyamine (LEVSIN SL) 0.125 MG SL tablet Place 1 tablet (0.125 mg total) under the tongue every 6 (six) hours as needed. 30 tablet 1  . macitentan (OPSUMIT) 10 MG tablet Take  1 tablet (10 mg total) by mouth daily. 90 tablet 3  . Magnesium Oxide (MAG-OXIDE) 200 MG TABS Take 2 tablets (400 mg total) by mouth daily. 180 tablet 3  . mometasone-formoterol (DULERA) 100-5 MCG/ACT AERO Inhale 2 puffs into the lungs 2 (two) times daily.    . Multiple Vitamin (MULTIVITAMIN) tablet Take 1 tablet by mouth daily.    Marland Kitchen  omeprazole (PRILOSEC) 40 MG capsule TAKE 1 CAPSULE BY MOUTH ONCE DAILY 90 capsule 0  . potassium chloride 20 MEQ TBCR Take 3 tablets( ) by mouth twice daily 90 tablet 5  . rosuvastatin (CRESTOR) 5 MG tablet TAKE 1 TABLET DAILY 90 tablet 3  . Selexipag (UPTRAVI) 1000 MCG TABS Take 1,000 mcg by mouth 2 (two) times daily. 60 tablet 11  . tadalafil, PAH, (ADCIRCA) 20 MG tablet Take 2 tablets (40 mg total) by mouth daily. 180 tablet 1  . thiamine 100 MG tablet Take 1 tablet (100 mg total) by mouth daily. 30 tablet 5  . torsemide (DEMADEX) 20 MG tablet Take 3 tablets (60 mg total) by mouth 2 (two) times daily. 180 tablet 5   No current facility-administered medications for this encounter.    Allergies:   Aspirin and Nitroglycerin   Social History:  The patient  reports that he quit smoking about 40 years ago. His smoking use included cigarettes. He has a 10.00 pack-year smoking history. He has never used smokeless tobacco. He reports current alcohol use of about 6.0 standard drinks of alcohol per week. He reports that he does not use drugs.   Family History:  The patient's family history includes Alcoholism in his maternal grandfather; Cancer in his father; Gout in his brother; Heart attack in his mother; Heart disease in his mother; Hypertension in his brother, brother, and mother; Kidney failure in his mother; Liver disease in his maternal grandfather and mother; Migraines in his sister; Prostate cancer in his father.   ROS:  Please see the history of present illness.   All other systems are personally reviewed and negative.   Exam:  (Video/Tele Health Call; Exam  is subjective and or/visual.) General:  Speaks in full sentences. No resp difficulty. Lungs: Normal respiratory effort with conversation.  Abdomen: + distended per patient report Extremities: Patient endorses moderate edema. Says he is wearing compression stockings. . Wearing hose Neuro: Alert & oriented x 3.   Recent Labs: 05/16/2019: B Natriuretic Peptide 1,119.2 06/02/2019: TSH 2.50 07/20/2019: Magnesium 1.7 09/22/2019: Hemoglobin 11.2; Platelets 27 10/11/2019: ALT 16; Pro B Natriuretic peptide (BNP) 604.0 10/27/2019: BUN 44; Creatinine, Ser 2.51; Potassium 3.0; Sodium 140  Personally reviewed   Wt Readings from Last 3 Encounters:  10/18/19 103 kg (227 lb)  10/11/19 103.1 kg (227 lb 3.2 oz)  09/22/19 94.8 kg (208 lb 15.9 oz)      ASSESSMENT AND PLAN:  1. PAH: Suspected portopulmonary HTN in setting of ETOH cirrhosis. - Remains NYHA III now with worsening R-sided HF and volume overload in setting of ongoing inability to manage his own diuretic regimen - 9/19 1280 feet (390 meters) - Echo 9/19 with EF 60-65% RV markedly dilated and hypokinetic with D-shaped septum - Echo  05/17/19: LVEF > 65% RV severely dilated with moderate RV dysfunction. Septal flattening Moderate TR RVSP . - Continue selexipag 1000 mcg twice a day. Intolerant higher dose due to diarrhea and headaches . - Continue macitentan 10 daily and adcirca 40 mg daily. - With RV strain on echo may need to consider repeat RHC and  IV therapies in the near future but doubt he can handle the complexity of this   2. Acute on chronic diastolic HF - Continues to struggle with volume overload and cardiorenal syndrome (baseline weight likely closer to 190-195). He is 203 but further diuresis limited by AKI - Now supposed to be back on torsemide 60 bid but only taking 40 bid. Instructed to increase  to 60 bid.  - Check labs this week - Increase KCL to 60 bid (was supposed to be taking 60 bid but only taking 40/20) - F/u  televist 1 week - Again we had an extensive discussion on how to handle diuretic regimen.  3. Acute CKD III:  -Baseline creatinine 1.7-2.0. Was 2.5 on 10/27/19 - Followed by Dr. Arrie Aran  - Change diuretics as above. Repeat labs this week  4. Hypokalemia - BMET this week  5. Cirrhosis:  - Likely combination of RV failure and ETOH - Follows with Dr. Christella Hartigan. - Did not tolerate nadolol.  - EGD 4/20 stable - Stressed need for ETOH cessation  6. Recurrent syncope/seizure - LINQ interrogations have shown no arrhythmias and thus most likely seizure - Continue Keppra. Follows with Dr. Karel Jarvis  7. HTN:  -.Blood pressure well controlled. Continue current regimen.  8. OSA:  - Has mild OSA with AHI 12 and desats down to 82%.  - Intolerant CPAP.   9. Pancytopenia:  - Has seen Dr. Clelia Croft in Hematology - felt like it is due to splenic sequestration.  - No role for BMBx current per Dr. Clelia Croft  10. Hypomag - continue magOx 400 daily  COVID screen The patient does not have any symptoms that suggest any further testing/ screening at this time. Social distancing reinforced today.  Patient Risk: After full review of this patients clinical status, I feel that they are at moderate risk for cardiac decompensation at this time.  Relevant cardiac medications were reviewed at length with the patient today. The patient does not have concerns regarding their medications at this time.   Recommended follow-up:1 week televisit & BMET  Today, I have spent18 minutes with the patient with telehealth technology discussing the above issues .   Signed, [name], MD  11/14/2019 4:42 PM  [facility] [address] Heart and Vascular [place] [address] [phone] (office) [phone] (fax)

## 2019-11-15 ENCOUNTER — Telehealth (HOSPITAL_COMMUNITY): Payer: Self-pay

## 2019-11-15 ENCOUNTER — Other Ambulatory Visit: Payer: Self-pay

## 2019-11-15 ENCOUNTER — Ambulatory Visit (HOSPITAL_COMMUNITY)
Admission: RE | Admit: 2019-11-15 | Discharge: 2019-11-15 | Disposition: A | Discharge status: Home / Self Care | Payer: Medicare Other | Source: Ambulatory Visit | Attending: Cardiology | Admitting: Cardiology

## 2019-11-15 ENCOUNTER — Encounter (HOSPITAL_COMMUNITY): Payer: Self-pay

## 2019-11-15 DIAGNOSIS — I5032 Chronic diastolic (congestive) heart failure: Secondary | ICD-10-CM | POA: Diagnosis not present

## 2019-11-15 LAB — BASIC METABOLIC PANEL
Anion gap: 10 (ref 5–15)
BUN: 42 mg/dL — ABNORMAL HIGH (ref 8–23)
CO2: 25 mmol/L (ref 22–32)
Calcium: 8.6 mg/dL — ABNORMAL LOW (ref 8.9–10.3)
Chloride: 104 mmol/L (ref 98–111)
Creatinine, Ser: 2.32 mg/dL — ABNORMAL HIGH (ref 0.61–1.24)
GFR calc Af Amer: 32 mL/min — ABNORMAL LOW (ref >60)
GFR calc non Af Amer: 28 mL/min — ABNORMAL LOW (ref >60)
Glucose, Bld: 110 mg/dL — ABNORMAL HIGH (ref 70–99)
Potassium: 3.2 mmol/L — ABNORMAL LOW (ref 3.5–5.1)
Sodium: 139 mmol/L (ref 135–145)

## 2019-11-15 NOTE — Telephone Encounter (Signed)
Called patient this morning to discuss visit summary from yesterday.  See below.  Reviewed with patient. No questions endorsed. Information sent to patient via mychart as requested. Appointment made for f/u   TAKE Torsemide 61m (3 tabs) twice a day  TAKE Potassium 60 meq (3 tabs) twice a day  Labs today We will only contact you if something comes back abnormal or we need to make some changes. Otherwise no news is good news!  Your physician recommends that you schedule a follow-up appointment in: 1 week TELEVISIT on Wednesday January 20th, 2021 at 2:20pm. [facility] you and your health needs are our priority. As part of our continuing mission to provide you with exceptional heart care, we have created designated Provider Care Teams. These Care Teams include your primary Cardiologist (physician) and Advanced Practice Providers (APPs- Physician Assistants and Nurse Practitioners) who all work together to provide you with the care you need, when you need it.   You may see any of the following providers on your designated Care Team at your next follow up: .Marland KitchenDr DGlori Bickers. Dr DLoralie Champagne. ADarrick Grinder NP . BLyda Jester PA . LAudry Riles PharmD   Please be sure to bring in all your medications bottles to every appointment.           1. Take torsemide 60 bid (3 tabs in am/3 tabs in pm)  2. Take KCl 60 bid (3 tabs in am/3 tabs in pm)  3. BMET 1-2 days  4. Televisit 1 week

## 2019-11-15 NOTE — Addendum Note (Signed)
Addended by: Valeda Malm on: 11/15/2019 09:33 AM   Modules accepted: Orders

## 2019-11-16 ENCOUNTER — Other Ambulatory Visit (HOSPITAL_COMMUNITY): Payer: Medicare Other

## 2019-11-17 ENCOUNTER — Other Ambulatory Visit (HOSPITAL_COMMUNITY): Payer: Self-pay

## 2019-11-17 MED ORDER — TADALAFIL (PAH) 20 MG PO TABS: 40.0000 mg | ORAL_TABLET | Freq: Every day | ORAL | 1 refills | Status: DC

## 2019-11-18 ENCOUNTER — Other Ambulatory Visit (HOSPITAL_COMMUNITY): Payer: Self-pay

## 2019-11-18 MED ORDER — TADALAFIL (PAH) 20 MG PO TABS: 40.0000 mg | ORAL_TABLET | Freq: Every day | ORAL | 1 refills | Status: AC

## 2019-11-21 ENCOUNTER — Telehealth (HOSPITAL_COMMUNITY): Payer: Self-pay | Admitting: Cardiology

## 2019-11-21 DIAGNOSIS — I5032 Chronic diastolic (congestive) heart failure: Secondary | ICD-10-CM

## 2019-11-21 NOTE — Telephone Encounter (Signed)
Pt aware, repeat labs 1/19 Order placed

## 2019-11-21 NOTE — Telephone Encounter (Signed)
-----  Message from Jolaine Artist, MD sent at 11/19/2019 11:38 AM EST ----- Repeat bmet this week ----- Message ----- From: Larey Dresser, MD Sent: 11/15/2019   4:43 PM EST To: Jolaine Artist, MD  Dr Bensimhon's patient

## 2019-11-22 ENCOUNTER — Other Ambulatory Visit: Payer: Self-pay

## 2019-11-22 ENCOUNTER — Ambulatory Visit (HOSPITAL_COMMUNITY)
Admission: RE | Admit: 2019-11-22 | Discharge: 2019-11-22 | Disposition: A | Discharge status: Home / Self Care | Payer: Medicare Other | Source: Ambulatory Visit | Attending: Cardiology | Admitting: Cardiology

## 2019-11-22 DIAGNOSIS — I5032 Chronic diastolic (congestive) heart failure: Secondary | ICD-10-CM | POA: Diagnosis not present

## 2019-11-22 LAB — BASIC METABOLIC PANEL
Anion gap: 11 (ref 5–15)
BUN: 56 mg/dL — ABNORMAL HIGH (ref 8–23)
CO2: 27 mmol/L (ref 22–32)
Calcium: 8.2 mg/dL — ABNORMAL LOW (ref 8.9–10.3)
Chloride: 97 mmol/L — ABNORMAL LOW (ref 98–111)
Creatinine, Ser: 2.42 mg/dL — ABNORMAL HIGH (ref 0.61–1.24)
GFR calc Af Amer: 30 mL/min — ABNORMAL LOW (ref >60)
GFR calc non Af Amer: 26 mL/min — ABNORMAL LOW (ref >60)
Glucose, Bld: 113 mg/dL — ABNORMAL HIGH (ref 70–99)
Potassium: 3.1 mmol/L — ABNORMAL LOW (ref 3.5–5.1)
Sodium: 135 mmol/L (ref 135–145)

## 2019-11-23 ENCOUNTER — Ambulatory Visit (HOSPITAL_COMMUNITY)
Admission: RE | Admit: 2019-11-23 | Discharge: 2019-11-23 | Disposition: A | Discharge status: Home / Self Care | Payer: Medicare Other | Source: Ambulatory Visit | Attending: Internal Medicine | Admitting: Internal Medicine

## 2019-11-23 NOTE — Progress Notes (Signed)
  No show

## 2019-11-30 ENCOUNTER — Telehealth (HOSPITAL_COMMUNITY): Payer: Self-pay

## 2019-11-30 DIAGNOSIS — H35371 Puckering of macula, right eye: Secondary | ICD-10-CM | POA: Diagnosis not present

## 2019-11-30 DIAGNOSIS — Q141 Congenital malformation of retina: Secondary | ICD-10-CM | POA: Diagnosis not present

## 2019-11-30 NOTE — Telephone Encounter (Signed)
Pt left message complaining of headaches and intermittent nose bleeds. He is taking pulm htn meds uptravi, macitenten and adcirca.  Nose bleeds started 3 days ago and he is worried. He wants to know what he should do in terms of his medications....if he should lower doses of anything. He has taken tylenol and maxes out on daily doses and at the same time, they dont help headache.  Message forwarded to Nurse Nira Conn to discuss with Dr Haroldine Laws

## 2019-12-01 ENCOUNTER — Other Ambulatory Visit: Payer: Self-pay | Admitting: Family Medicine

## 2019-12-01 ENCOUNTER — Ambulatory Visit (INDEPENDENT_AMBULATORY_CARE_PROVIDER_SITE_OTHER): Payer: Medicare Other | Admitting: Family Medicine

## 2019-12-01 ENCOUNTER — Telehealth: Payer: Self-pay | Admitting: Family Medicine

## 2019-12-01 ENCOUNTER — Ambulatory Visit (INDEPENDENT_AMBULATORY_CARE_PROVIDER_SITE_OTHER): Payer: Medicare Other | Admitting: *Deleted

## 2019-12-01 ENCOUNTER — Other Ambulatory Visit: Payer: Self-pay

## 2019-12-01 VITALS — BP 126/93 | Wt 200.0 lb

## 2019-12-01 DIAGNOSIS — R519 Headache, unspecified: Secondary | ICD-10-CM

## 2019-12-01 DIAGNOSIS — R55 Syncope and collapse: Secondary | ICD-10-CM

## 2019-12-01 DIAGNOSIS — R739 Hyperglycemia, unspecified: Secondary | ICD-10-CM

## 2019-12-01 DIAGNOSIS — I10 Essential (primary) hypertension: Secondary | ICD-10-CM

## 2019-12-01 DIAGNOSIS — R197 Diarrhea, unspecified: Secondary | ICD-10-CM

## 2019-12-01 DIAGNOSIS — K921 Melena: Secondary | ICD-10-CM | POA: Diagnosis not present

## 2019-12-01 DIAGNOSIS — R195 Other fecal abnormalities: Secondary | ICD-10-CM

## 2019-12-01 LAB — CUP PACEART REMOTE DEVICE CHECK
Date Time Interrogation Session: 20210128113446
Implantable Pulse Generator Implant Date: 20180301

## 2019-12-01 MED ORDER — DIPHENOXYLATE-ATROPINE 2.5-0.025 MG PO TABS: 1.0000 | ORAL_TABLET | Freq: Four times a day (QID) | ORAL | 1 refills | Status: DC | PRN

## 2019-12-01 MED ORDER — MUPIROCIN 2 % EX OINT: 1.0000 "application " | TOPICAL_OINTMENT | Freq: Every day | CUTANEOUS | 0 refills | Status: DC

## 2019-12-01 MED FILL — MUPIROCIN 2% OINTMENT: 2 | 30 days supply | Qty: 22 | Fill #0

## 2019-12-01 MED FILL — DIPHENOXYLATE-ATROPINE 2.5-: 2.5-0.025 | 10 days supply | Qty: 40 | Fill #0

## 2019-12-01 NOTE — Assessment & Plan Note (Signed)
Notably worse per patient since he had to have his cardiac meds adjusted. Have numerous loose stool daily. He is allowed a prescription of Lomotil to try and he agrees to come in and have lab work drawn to assess his dehyration and electrolyte status. Labs ordered at [facility].

## 2019-12-01 NOTE — Assessment & Plan Note (Addendum)
hgba1c acceptable, minimize simple carbs. Increase exercise as tolerated.  

## 2019-12-01 NOTE — Progress Notes (Signed)
Wants talk about headaches and nose bleeds and wants something for sleep

## 2019-12-01 NOTE — Assessment & Plan Note (Signed)
Well controlled, no changes to meds. Encouraged heart healthy diet such as the DASH diet and exercise as tolerated.

## 2019-12-01 NOTE — Assessment & Plan Note (Signed)
Noted since changing meds check labs

## 2019-12-01 NOTE — Progress Notes (Unsigned)
Subjective:    Patient ID: Darren Mueller, male    DOB: 11/07/1950, 69 y.o.   MRN: [id number]  No chief complaint on file.   HPI Patient is in today for evaluation of headaches and worsening diarrhea.   Past Medical History:  Diagnosis Date  . Alcohol dependence ([place]) 03/22/2011   In remission   . Alcohol dependence in remission ([place]) 03/22/2011   In remission   . Alcoholic cirrhosis of liver with ascites ([place]) 2014  . Anginal pain ([place])    with SOB admitted 04/20/2019  . Cardiomegaly 02/2019   Noted on CXR  . CHF (congestive heart failure) ([place])   . CKD (chronic kidney disease), stage III 07/18/2013  . COPD (chronic obstructive pulmonary disease) ([place])   . Coronary artery disease   . Dermatitis 06/10/2015  . Diarrhea 09/04/2013  . Dyspnea    admitted 04/20/2019  . Elbow pain, left 03/17/2017  . Eustachian tube dysfunction   . GERD (gastroesophageal reflux disease)   . Gout   . Headache   . Hearing loss   . Heart murmur   . Hx of colonic polyps   . Hypertension   . Hypertriglyceridemia 03/17/2017  . Hypopotassemia   . Otalgia 11/28/2013  . Other chronic pulmonary heart diseases   . PAH (pulmonary artery hypertension) ([facility])    RV failure  . Pancytopenia ([place])   . Pleural effusion 2014   Noted on CT:   Moderately large simple layering right pleural effusion   . Preventative health care 06/29/2016  . Pulmonary nodule 2014   Noted on CT: An 8 mm pulmonary nodule in the periphery of the left lower lobe  . Rectal bleeding   . Red eye 03/03/2016   right  . Seizures ([place])   . Sleep apnea    does not wear CPAP  . Splenomegaly 12/2015   Noted on CT  . Thoracic aorta atherosclerosis ([place]) 12/2017  . Thrombocytopenia ([place]) 06/29/2016  . Unspecified hypothyroidism 01/25/2013  . Unspecified pleural effusion   . Vertigo   . Wears glasses     Past Surgical History:  Procedure Laterality Date  . CARDIAC CATHETERIZATION N/A 12/21/2015   Procedure: Right Heart Cath;  Surgeon:  Jolaine Artist, MD;  Location: [facility];  Service: Cardiovascular;  Laterality: N/A;  . CARDIAC CATHETERIZATION N/A 04/15/2016   Procedure: Right/Left Heart Cath and Coronary Angiography;  Surgeon: Jolaine Artist, MD;  Location: [facility];  Service: Cardiovascular;  Laterality: N/A;  . COLONOSCOPY N/A 12/08/2013   Procedure: COLONOSCOPY;  Surgeon: Milus Banister, MD;  Location: [facility];  Service: Endoscopy;  Laterality: N/A;  . COLONOSCOPY WITH PROPOFOL N/A 03/24/2019   Procedure: COLONOSCOPY WITH PROPOFOL;  Surgeon: Milus Banister, MD;  Location: [facility];  Service: Endoscopy;  Laterality: N/A;  Draw CBC in preop  . COLONOSCOPY WITH PROPOFOL N/A 04/28/2019   Procedure: COLONOSCOPY WITH PROPOFOL;  Surgeon: Milus Banister, MD;  Location: [facility];  Service: Endoscopy;  Laterality: N/A;  . ESOPHAGOGASTRODUODENOSCOPY (EGD) WITH PROPOFOL N/A 03/27/2016   Procedure: ESOPHAGOGASTRODUODENOSCOPY (EGD) WITH PROPOFOL;  Surgeon: Milus Banister, MD;  Location: [facility];  Service: Endoscopy;  Laterality: N/A;  . ESOPHAGOGASTRODUODENOSCOPY (EGD) WITH PROPOFOL N/A 02/14/2019   Procedure: ESOPHAGOGASTRODUODENOSCOPY (EGD) WITH PROPOFOL;  Surgeon: Jerene Bears, MD;  Location: [facility] ENDOSCOPY;  Service: Gastroenterology;  Laterality: N/A;  . EYE SURGERY Bilateral 03/15/2019   lasix  . GAS/FLUID EXCHANGE Right 09/22/2019   Procedure: GAS/FLUID  EXCHANGE (C3F8) RIGHT EYE;  Surgeon: Sherlynn Stalls, MD;  Location: [facility];  Service: Ophthalmology;  Laterality: Right;  . HEMOSTASIS CLIP PLACEMENT  04/28/2019   Procedure: HEMOSTASIS CLIP PLACEMENT;  Surgeon: Milus Banister, MD;  Location: [facility];  Service: Endoscopy;;  . LOOP RECORDER INSERTION N/A 01/01/2017   Procedure: Loop Recorder Insertion;  Surgeon: Thompson Grayer, MD;  Location: [facility];  Service: Cardiovascular;  Laterality: N/A;  . PARS PLANA VITRECTOMY Right 09/22/2019   Procedure: PARS PLANA VITRECTOMY  WITH 25 GAUGE RIGHT EYE;  Surgeon: Sherlynn Stalls, MD;  Location: [facility];  Service: Ophthalmology;  Laterality: Right;  . PHOTOCOAGULATION WITH LASER Right 09/22/2019   Procedure: PHOTOCOAGULATION WITH LASER RIGHT EYE;  Surgeon: Sherlynn Stalls, MD;  Location: [facility];  Service: Ophthalmology;  Laterality: Right;  . POLYPECTOMY  04/28/2019   Procedure: POLYPECTOMY;  Surgeon: Milus Banister, MD;  Location: [facility];  Service: Endoscopy;;  . RIGHT HEART CATHETERIZATION Right 06/06/2014   Procedure: RIGHT HEART CATH;  Surgeon: Jolaine Artist, MD;  Location: [facility] LAB;  Service: Cardiovascular;  Laterality: Right;  . UVULOPALATOPHARYNGOPLASTY  1999    Family History  Problem Relation Age of Onset  . Heart disease Mother   . Heart attack Mother   . Hypertension Mother   . Kidney failure Mother   . Liver disease Mother        stage 4 liver disease  . Prostate cancer Father   . Cancer Father        lung  . Hypertension Brother   . Gout Brother   . Alcoholism Maternal Grandfather   . Liver disease Maternal Grandfather   . Migraines Sister   . Hypertension Brother   . Colon cancer Neg Hx     Social History   Socioeconomic History  . Marital status: Divorced    Spouse name: Not on file  . Number of children: 3  . Years of education: Not on file  . Highest education level: Not on file  Occupational History  . Occupation: Retired    Comment: Social research officer, government  Tobacco Use  . Smoking status: Former Smoker    Packs/day: 2.00    Years: 5.00    Pack years: 10.00    Types: Cigarettes    Quit date: 09/22/1979    Years since quitting: 40.2  . Smokeless tobacco: Never Used  Substance and Sexual Activity  . Alcohol use: Yes    Alcohol/week: 6.0 standard drinks    Types: 6 Shots of liquor per week    Comment: social drinker  . Drug use: No  . Sexual activity: Yes    Birth control/protection: Spermicide  Other Topics Concern  . Not on file  Social History Narrative   0 caffeine  drinks daily    Social Determinants of Health   Financial Resource Strain: Low Risk   . Difficulty of Paying Living Expenses: Not hard at all  Food Insecurity: No Food Insecurity  . Worried About Charity fundraiser in the Last Year: Never true  . Ran Out of Food in the Last Year: Never true  Transportation Needs: No Transportation Needs  . Lack of Transportation (Medical): No  . Lack of Transportation (Non-Medical): No  Physical Activity: Inactive  . Days of Exercise per Week: 0 days  . Minutes of Exercise per Session: 0 min  Stress: Stress Concern Present  . Feeling of Stress : Rather much  Social Connections: Slightly Isolated  . Frequency  of Communication with Friends and Family: More than three times a week  . Frequency of Social Gatherings with Friends and Family: Three times a week  . Attends Religious Services: More than 4 times per year  . Active Member of Clubs or Organizations: Yes  . Attends Archivist Meetings: More than 4 times per year  . Marital Status: Divorced  Human resources officer Violence:   . Fear of Current or Ex-Partner: Not on file  . Emotionally Abused: Not on file  . Physically Abused: Not on file  . Sexually Abused: Not on file    Outpatient Medications Prior to Visit  Medication Sig Dispense Refill  . acetaminophen (TYLENOL) 500 MG tablet Take 1,000 mg by mouth every 6 (six) hours as needed (for pain.).    Marland Kitchen ADVAIR HFA 230-21 MCG/ACT inhaler USE 2 INHALATIONS EVERY MORNING 48 g 5  . albuterol (VENTOLIN HFA) 108 (90 Base) MCG/ACT inhaler Inhale 2 puffs into the lungs every 6 (six) hours as needed for wheezing or shortness of breath.    . allopurinol (ZYLOPRIM) 100 MG tablet Take 2 tablets (200 mg total) by mouth daily. 180 tablet 1  . Blood Pressure Monitoring (BLOOD PRESSURE CUFF) MISC Use as directed. Check blood pressure bid prn Dx:I10 1 each 0  . colchicine 0.6 MG tablet Take 0.6 mg by mouth daily as needed (gout flare).    . Difluprednate  (DUREZOL) 0.05 % EMUL Place 1 drop into the left eye daily.    . diphenoxylate-atropine (LOMOTIL) 2.5-0.025 MG tablet Take 1 tablet by mouth 3 (three) times daily. 60 tablet 0  . diphenoxylate-atropine (LOMOTIL) 2.5-0.025 MG tablet Take 1 tablet by mouth 4 (four) times daily as needed for diarrhea or loose stools. 40 tablet 1  . doxepin (SINEQUAN) 10 MG capsule Take 10-20 mg by mouth at bedtime.    . Ferrous Fumarate (HEMOCYTE) 324 (106 Fe) MG TABS tablet Take 1 tablet (106 mg of iron total) by mouth daily. 30 tablet 5  . folic acid (FOLVITE) 1 MG tablet Take 1 tablet (1 mg total) by mouth daily. 30 tablet 5  . hydrOXYzine (ATARAX/VISTARIL) 25 MG tablet Take 1 tablet (25 mg total) by mouth 3 (three) times daily as needed. 30 tablet 0  . hyoscyamine (LEVSIN SL) 0.125 MG SL tablet Place 1 tablet (0.125 mg total) under the tongue every 6 (six) hours as needed. 30 tablet 1  . macitentan (OPSUMIT) 10 MG tablet Take 1 tablet (10 mg total) by mouth daily. 90 tablet 3  . Magnesium Oxide (MAG-OXIDE) 200 MG TABS Take 2 tablets (400 mg total) by mouth daily. 180 tablet 3  . mometasone-formoterol (DULERA) 100-5 MCG/ACT AERO Inhale 2 puffs into the lungs 2 (two) times daily.    . Multiple Vitamin (MULTIVITAMIN) tablet Take 1 tablet by mouth daily.    . mupirocin ointment (BACTROBAN) 2 % Place 1 application into the nose at bedtime. 22 g 0  . omeprazole (PRILOSEC) 40 MG capsule TAKE 1 CAPSULE BY MOUTH ONCE DAILY 90 capsule 0  . potassium chloride 20 MEQ TBCR Take 3 tablets( 72mq) by mouth twice daily 90 tablet 5  . rosuvastatin (CRESTOR) 5 MG tablet TAKE 1 TABLET DAILY 90 tablet 3  . Selexipag (UPTRAVI) 1000 MCG TABS Take 1,000 mcg by mouth 2 (two) times daily. 60 tablet 11  . tadalafil, PAH, (ADCIRCA) 20 MG tablet Take 2 tablets (40 mg total) by mouth daily. 180 tablet 1  . thiamine 100 MG tablet Take 1 tablet (  100 mg total) by mouth daily. 30 tablet 5  . torsemide (DEMADEX) 20 MG tablet Take 3 tablets (60 mg  total) by mouth 2 (two) times daily. 180 tablet 5   No facility-administered medications prior to visit.    Allergies  Allergen Reactions  . Aspirin Hypertension  . Nitroglycerin Other (See Comments)    Headache    Review of Systems  Constitutional: Positive for malaise/fatigue. Negative for fever.  HENT: Negative for congestion.   Eyes: Negative for blurred vision.  Respiratory: Negative for shortness of breath.   Cardiovascular: Negative for chest pain, palpitations and leg swelling.  Gastrointestinal: Positive for diarrhea. Negative for abdominal pain, blood in stool and nausea.  Genitourinary: Negative for dysuria and frequency.  Musculoskeletal: Negative for falls.  Skin: Negative for rash.  Neurological: Positive for headaches. Negative for dizziness and loss of consciousness.  Endo/Heme/Allergies: Negative for environmental allergies.  Psychiatric/Behavioral: Negative for depression. The patient is not nervous/anxious.        Objective:    Physical Exam unable to obtain via phone visit  There were no vitals taken for this visit. Wt Readings from Last 3 Encounters:  12/01/19 200 lb (90.7 kg)  10/18/19 227 lb (103 kg)  10/11/19 227 lb 3.2 oz (103.1 kg)    Diabetic Foot Exam - Simple   No data filed     Lab Results  Component Value Date   WBC 1.5 (L) 09/22/2019   HGB 11.2 (L) 09/22/2019   HCT 33.0 (L) 09/22/2019   PLT 27 (LL) 09/22/2019   GLUCOSE 113 (H) 11/22/2019   CHOL 145 10/11/2019   TRIG 74.0 10/11/2019   HDL 57.00 10/11/2019   LDLDIRECT 34.0 05/04/2018   LDLCALC 73 10/11/2019   ALT 16 10/11/2019   AST 55 (H) 10/11/2019   NA 135 11/22/2019   K 3.1 (L) 11/22/2019   CL 97 (L) 11/22/2019   CREATININE 2.42 (H) 11/22/2019   BUN 56 (H) 11/22/2019   CO2 27 11/22/2019   TSH 2.50 06/02/2019   PSA 0.49 11/01/2012   INR 1.2 05/16/2019   HGBA1C 4.8 04/13/2019    Lab Results  Component Value Date   TSH 2.50 06/02/2019   Lab Results  Component  Value Date   WBC 1.5 (L) 09/22/2019   HGB 11.2 (L) 09/22/2019   HCT 33.0 (L) 09/22/2019   MCV 113.9 (H) 09/22/2019   PLT 27 (LL) 09/22/2019   Lab Results  Component Value Date   NA 135 11/22/2019   K 3.1 (L) 11/22/2019   CO2 27 11/22/2019   GLUCOSE 113 (H) 11/22/2019   BUN 56 (H) 11/22/2019   CREATININE 2.42 (H) 11/22/2019   BILITOT 2.5 (H) 10/11/2019   ALKPHOS 379 (H) 10/11/2019   AST 55 (H) 10/11/2019   ALT 16 10/11/2019   PROT 6.8 10/11/2019   ALBUMIN 3.2 (L) 10/11/2019   CALCIUM 8.2 (L) 11/22/2019   ANIONGAP 11 11/22/2019   GFR 54.77 (L) 10/11/2019   Lab Results  Component Value Date   CHOL 145 10/11/2019   Lab Results  Component Value Date   HDL 57.00 10/11/2019   Lab Results  Component Value Date   LDLCALC 73 10/11/2019   Lab Results  Component Value Date   TRIG 74.0 10/11/2019   Lab Results  Component Value Date   CHOLHDL 3 10/11/2019   Lab Results  Component Value Date   HGBA1C 4.8 04/13/2019       Assessment & Plan:   Problem List Items Addressed  This Visit    Headache   Relevant Orders   CT Head Wo Contrast      I am having Darren Mueller "Col" maintain his diphenoxylate-atropine, Blood Pressure Cuff, acetaminophen, colchicine, Ferrous Fumarate, thiamine, Uptravi, multivitamin, mometasone-formoterol, albuterol, allopurinol, Mag-Oxide, hyoscyamine, folic acid, hydrOXYzine, rosuvastatin, Opsumit, doxepin, Durezol, Advair HFA, torsemide, Potassium Chloride ER, omeprazole, tadalafil (PAH), mupirocin ointment, and diphenoxylate-atropine.  No orders of the defined types were placed in this encounter.    Penni Homans, MD

## 2019-12-01 NOTE — Assessment & Plan Note (Signed)
He notes intermittently very bad headaches since his medication changes. He will discuss with cardiology and he is reminded if he has serious headaches that do not resolve he may need to proceed to ER for emergent evaluation. Will order CT scan of head to rule out any concerns. Patient does not note significant headache during visit

## 2019-12-01 NOTE — Progress Notes (Addendum)
Patient ID: Darren Mueller, male   DOB: 1951-04-01, 69 y.o.   MRN: [id number]  Virtual Visit via phone Note  I connected with Darren Mueller on 1/28/21at  2:00 PM EST by a phone enabled telemedicine application and verified that I am speaking with the correct person using two identifiers.  Location: Patient: home Provider: office   I discussed the limitations of evaluation and management by telemedicine and the availability of in person appointments. The patient expressed understanding and agreed to proceed. Magdalene Molly, CMA was able to get the patient set up on a visit, phone after being unable to set up a video visit.   Subjective:    Patient ID: Darren Mueller, male    DOB: 1951/08/05, 69 y.o.   MRN: [id number]  No chief complaint on file.   HPI Patient is in today for evaluation of headaches and worsening diarrhea. He notes intermittently very bad headaches since his medication changes. Notably worse per patient since he had to have his cardiac meds adjusted. Have numerous loose stool daily. Denies CP/palp/SOB/congestion/fevers or GU c/o. Taking meds as prescribed  Past Medical History:  Diagnosis Date  . Alcohol dependence ([place]) 03/22/2011   In remission   . Alcohol dependence in remission ([place]) 03/22/2011   In remission   . Alcoholic cirrhosis of liver with ascites ([place]) 2014  . Anginal pain ([place])    with SOB admitted 04/20/2019  . Cardiomegaly 02/2019   Noted on CXR  . CHF (congestive heart failure) ([place])   . CKD (chronic kidney disease), stage III 07/18/2013  . COPD (chronic obstructive pulmonary disease) ([place])   . Coronary artery disease   . Dermatitis 06/10/2015  . Diarrhea 09/04/2013  . Dyspnea    admitted 04/20/2019  . Elbow pain, left 03/17/2017  . Eustachian tube dysfunction   . GERD (gastroesophageal reflux disease)   . Gout   . Headache   . Hearing loss   . Heart murmur   . Hx of colonic polyps   . Hypertension   . Hypertriglyceridemia 03/17/2017  .  Hypopotassemia   . Otalgia 11/28/2013  . Other chronic pulmonary heart diseases   . PAH (pulmonary artery hypertension) ([facility])    RV failure  . Pancytopenia ([place])   . Pleural effusion 2014   Noted on CT:   Moderately large simple layering right pleural effusion   . Preventative health care 06/29/2016  . Pulmonary nodule 2014   Noted on CT: An 8 mm pulmonary nodule in the periphery of the left lower lobe  . Rectal bleeding   . Red eye 03/03/2016   right  . Seizures ([place])   . Sleep apnea    does not wear CPAP  . Splenomegaly 12/2015   Noted on CT  . Thoracic aorta atherosclerosis ([place]) 12/2017  . Thrombocytopenia ([place]) 06/29/2016  . Unspecified hypothyroidism 01/25/2013  . Unspecified pleural effusion   . Vertigo   . Wears glasses     Past Surgical History:  Procedure Laterality Date  . CARDIAC CATHETERIZATION N/A 12/21/2015   Procedure: Right Heart Cath;  Surgeon: Jolaine Artist, MD;  Location: [facility];  Service: Cardiovascular;  Laterality: N/A;  . CARDIAC CATHETERIZATION N/A 04/15/2016   Procedure: Right/Left Heart Cath and Coronary Angiography;  Surgeon: Jolaine Artist, MD;  Location: [facility];  Service: Cardiovascular;  Laterality: N/A;  . COLONOSCOPY N/A 12/08/2013   Procedure: COLONOSCOPY;  Surgeon: Milus Banister, MD;  Location: [facility];  Service:  Endoscopy;  Laterality: N/A;  . COLONOSCOPY WITH PROPOFOL N/A 03/24/2019   Procedure: COLONOSCOPY WITH PROPOFOL;  Surgeon: Milus Banister, MD;  Location: [facility];  Service: Endoscopy;  Laterality: N/A;  Draw CBC in preop  . COLONOSCOPY WITH PROPOFOL N/A 04/28/2019   Procedure: COLONOSCOPY WITH PROPOFOL;  Surgeon: Milus Banister, MD;  Location: [facility];  Service: Endoscopy;  Laterality: N/A;  . ESOPHAGOGASTRODUODENOSCOPY (EGD) WITH PROPOFOL N/A 03/27/2016   Procedure: ESOPHAGOGASTRODUODENOSCOPY (EGD) WITH PROPOFOL;  Surgeon: Milus Banister, MD;  Location: [facility];  Service: Endoscopy;   Laterality: N/A;  . ESOPHAGOGASTRODUODENOSCOPY (EGD) WITH PROPOFOL N/A 02/14/2019   Procedure: ESOPHAGOGASTRODUODENOSCOPY (EGD) WITH PROPOFOL;  Surgeon: Jerene Bears, MD;  [facility];  Service: Gastroenterology;  Laterality: N/A;  . EYE SURGERY Bilateral 03/15/2019   lasix  . GAS/FLUID EXCHANGE Right 09/22/2019   Procedure: GAS/FLUID EXCHANGE (C3F8) RIGHT EYE;  Surgeon: Sherlynn Stalls, MD;  Location: [facility];  Service: Ophthalmology;  Laterality: Right;  . HEMOSTASIS CLIP PLACEMENT  04/28/2019   Procedure: HEMOSTASIS CLIP PLACEMENT;  Surgeon: Milus Banister, MD;  Location: [facility];  Service: Endoscopy;;  . LOOP RECORDER INSERTION N/A 01/01/2017   Procedure: Loop Recorder Insertion;  Surgeon: Thompson Grayer, MD;  Location: [facility];  Service: Cardiovascular;  Laterality: N/A;  . PARS PLANA VITRECTOMY Right 09/22/2019   Procedure: PARS PLANA VITRECTOMY WITH 25 GAUGE RIGHT EYE;  Surgeon: Sherlynn Stalls, MD;  Location: [facility];  Service: Ophthalmology;  Laterality: Right;  . PHOTOCOAGULATION WITH LASER Right 09/22/2019   Procedure: PHOTOCOAGULATION WITH LASER RIGHT EYE;  Surgeon: Sherlynn Stalls, MD;  Location: [facility];  Service: Ophthalmology;  Laterality: Right;  . POLYPECTOMY  04/28/2019   Procedure: POLYPECTOMY;  Surgeon: Milus Banister, MD;  Location: [facility];  Service: Endoscopy;;  . RIGHT HEART CATHETERIZATION Right 06/06/2014   Procedure: RIGHT HEART CATH;  Surgeon: Jolaine Artist, MD;  Location: [facility] LAB;  Service: Cardiovascular;  Laterality: Right;  . UVULOPALATOPHARYNGOPLASTY  1999    Family History  Problem Relation Age of Onset  . Heart disease Mother   . Heart attack Mother   . Hypertension Mother   . Kidney failure Mother   . Liver disease Mother        stage 4 liver disease  . Prostate cancer Father   . Cancer Father        lung  . Hypertension Brother   . Gout Brother   . Alcoholism Maternal Grandfather   . Liver disease Maternal  Grandfather   . Migraines Sister   . Hypertension Brother   . Colon cancer Neg Hx     Social History   Socioeconomic History  . Marital status: Divorced    Spouse name: Not on file  . Number of children: 3  . Years of education: Not on file  . Highest education level: Not on file  Occupational History  . Occupation: Retired    Comment: Social research officer, government  Tobacco Use  . Smoking status: Former Smoker    Packs/day: 2.00    Years: 5.00    Pack years: 10.00    Types: Cigarettes    Quit date: 09/22/1979    Years since quitting: 40.2  . Smokeless tobacco: Never Used  Substance and Sexual Activity  . Alcohol use: Yes    Alcohol/week: 6.0 standard drinks    Types: 6 Shots of liquor per week    Comment: social drinker  . Drug use: No  . Sexual  activity: Yes    Birth control/protection: Spermicide  Other Topics Concern  . Not on file  Social History Narrative   0 caffeine drinks daily    Social Determinants of Health   Financial Resource Strain: Low Risk   . Difficulty of Paying Living Expenses: Not hard at all  Food Insecurity: No Food Insecurity  . Worried About Charity fundraiser in the Last Year: Never true  . Ran Out of Food in the Last Year: Never true  Transportation Needs: No Transportation Needs  . Lack of Transportation (Medical): No  . Lack of Transportation (Non-Medical): No  Physical Activity: Inactive  . Days of Exercise per Week: 0 days  . Minutes of Exercise per Session: 0 min  Stress: Stress Concern Present  . Feeling of Stress : Rather much  Social Connections: Slightly Isolated  . Frequency of Communication with Friends and Family: More than three times a week  . Frequency of Social Gatherings with Friends and Family: Three times a week  . Attends Religious Services: More than 4 times per year  . Active Member of Clubs or Organizations: Yes  . Attends Archivist Meetings: More than 4 times per year  . Marital Status: Divorced  Human resources officer  Violence:   . Fear of Current or Ex-Partner: Not on file  . Emotionally Abused: Not on file  . Physically Abused: Not on file  . Sexually Abused: Not on file    Outpatient Medications Prior to Visit  Medication Sig Dispense Refill  . acetaminophen (TYLENOL) 500 MG tablet Take 1,000 mg by mouth every 6 (six) hours as needed (for pain.).    Marland Kitchen ADVAIR HFA 230-21 MCG/ACT inhaler USE 2 INHALATIONS EVERY MORNING 48 g 5  . albuterol (VENTOLIN HFA) 108 (90 Base) MCG/ACT inhaler Inhale 2 puffs into the lungs every 6 (six) hours as needed for wheezing or shortness of breath.    . allopurinol (ZYLOPRIM) 100 MG tablet Take 2 tablets (200 mg total) by mouth daily. 180 tablet 1  . Blood Pressure Monitoring (BLOOD PRESSURE CUFF) MISC Use as directed. Check blood pressure bid prn Dx:I10 1 each 0  . colchicine 0.6 MG tablet Take 0.6 mg by mouth daily as needed (gout flare).    . Difluprednate (DUREZOL) 0.05 % EMUL Place 1 drop into the left eye daily.    . diphenoxylate-atropine (LOMOTIL) 2.5-0.025 MG tablet Take 1 tablet by mouth 3 (three) times daily. 60 tablet 0  . doxepin (SINEQUAN) 10 MG capsule Take 10-20 mg by mouth at bedtime.    . Ferrous Fumarate (HEMOCYTE) 324 (106 Fe) MG TABS tablet Take 1 tablet (106 mg of iron total) by mouth daily. 30 tablet 5  . folic acid (FOLVITE) 1 MG tablet Take 1 tablet (1 mg total) by mouth daily. 30 tablet 5  . hydrOXYzine (ATARAX/VISTARIL) 25 MG tablet Take 1 tablet (25 mg total) by mouth 3 (three) times daily as needed. 30 tablet 0  . hyoscyamine (LEVSIN SL) 0.125 MG SL tablet Place 1 tablet (0.125 mg total) under the tongue every 6 (six) hours as needed. 30 tablet 1  . macitentan (OPSUMIT) 10 MG tablet Take 1 tablet (10 mg total) by mouth daily. 90 tablet 3  . Magnesium Oxide (MAG-OXIDE) 200 MG TABS Take 2 tablets (400 mg total) by mouth daily. 180 tablet 3  . mometasone-formoterol (DULERA) 100-5 MCG/ACT AERO Inhale 2 puffs into the lungs 2 (two) times daily.    .  Multiple Vitamin (MULTIVITAMIN) tablet  Take 1 tablet by mouth daily.    Marland Kitchen omeprazole (PRILOSEC) 40 MG capsule TAKE 1 CAPSULE BY MOUTH ONCE DAILY 90 capsule 0  . potassium chloride 20 MEQ TBCR Take 3 tablets( 23mq) by mouth twice daily 90 tablet 5  . rosuvastatin (CRESTOR) 5 MG tablet TAKE 1 TABLET DAILY 90 tablet 3  . Selexipag (UPTRAVI) 1000 MCG TABS Take 1,000 mcg by mouth 2 (two) times daily. 60 tablet 11  . tadalafil, PAH, (ADCIRCA) 20 MG tablet Take 2 tablets (40 mg total) by mouth daily. 180 tablet 1  . thiamine 100 MG tablet Take 1 tablet (100 mg total) by mouth daily. 30 tablet 5  . torsemide (DEMADEX) 20 MG tablet Take 3 tablets (60 mg total) by mouth 2 (two) times daily. 180 tablet 5   No facility-administered medications prior to visit.    Allergies  Allergen Reactions  . Aspirin Hypertension  . Nitroglycerin Other (See Comments)    Headache    Review of Systems  Constitutional: Positive for malaise/fatigue. Negative for fever.  HENT: Negative for congestion.   Eyes: Negative for blurred vision.  Respiratory: Negative for shortness of breath.   Cardiovascular: Negative for chest pain, palpitations and leg swelling.  Gastrointestinal: Positive for diarrhea. Negative for abdominal pain, blood in stool and nausea.  Genitourinary: Negative for dysuria and frequency.  Musculoskeletal: Negative for falls.  Skin: Negative for rash.  Neurological: Positive for headaches. Negative for dizziness and loss of consciousness.  Endo/Heme/Allergies: Negative for environmental allergies.  Psychiatric/Behavioral: Negative for depression. The patient is not nervous/anxious.        Objective:    Physical Exam unable to obtain via phone visit.   BP (!) 126/93   Wt 200 lb (90.7 kg)   BMI 27.12 kg/m  Wt Readings from Last 3 Encounters:  12/01/19 200 lb (90.7 kg)  10/18/19 227 lb (103 kg)  10/11/19 227 lb 3.2 oz (103.1 kg)    Diabetic Foot Exam - Simple   No data filed      Lab Results  Component Value Date   WBC 1.5 (L) 09/22/2019   HGB 11.2 (L) 09/22/2019   HCT 33.0 (L) 09/22/2019   PLT 27 (LL) 09/22/2019   GLUCOSE 113 (H) 11/22/2019   CHOL 145 10/11/2019   TRIG 74.0 10/11/2019   HDL 57.00 10/11/2019   LDLDIRECT 34.0 05/04/2018   LDLCALC 73 10/11/2019   ALT 16 10/11/2019   AST 55 (H) 10/11/2019   NA 135 11/22/2019   K 3.1 (L) 11/22/2019   CL 97 (L) 11/22/2019   CREATININE 2.42 (H) 11/22/2019   BUN 56 (H) 11/22/2019   CO2 27 11/22/2019   TSH 2.50 06/02/2019   PSA 0.49 11/01/2012   INR 1.2 05/16/2019   HGBA1C 4.8 04/13/2019    Lab Results  Component Value Date   TSH 2.50 06/02/2019   Lab Results  Component Value Date   WBC 1.5 (L) 09/22/2019   HGB 11.2 (L) 09/22/2019   HCT 33.0 (L) 09/22/2019   MCV 113.9 (H) 09/22/2019   PLT 27 (LL) 09/22/2019   Lab Results  Component Value Date   NA 135 11/22/2019   K 3.1 (L) 11/22/2019   CO2 27 11/22/2019   GLUCOSE 113 (H) 11/22/2019   BUN 56 (H) 11/22/2019   CREATININE 2.42 (H) 11/22/2019   BILITOT 2.5 (H) 10/11/2019   ALKPHOS 379 (H) 10/11/2019   AST 55 (H) 10/11/2019   ALT 16 10/11/2019   PROT 6.8 10/11/2019   ALBUMIN  3.2 (L) 10/11/2019   CALCIUM 8.2 (L) 11/22/2019   ANIONGAP 11 11/22/2019   GFR 54.77 (L) 10/11/2019   Lab Results  Component Value Date   CHOL 145 10/11/2019   Lab Results  Component Value Date   HDL 57.00 10/11/2019   Lab Results  Component Value Date   LDLCALC 73 10/11/2019   Lab Results  Component Value Date   TRIG 74.0 10/11/2019   Lab Results  Component Value Date   CHOLHDL 3 10/11/2019   Lab Results  Component Value Date   HGBA1C 4.8 04/13/2019       Assessment & Plan:   Problem List Items Addressed This Visit    Hypertension - Primary (Chronic)    Well controlled, no changes to meds. Encouraged heart healthy diet such as the DASH diet and exercise as tolerated.       Relevant Orders   CBC   Diarrhea    Notably worse per patient  since he had to have his cardiac meds adjusted. Have numerous loose stool daily. He is allowed a prescription of Lomotil to try and he agrees to come in and have lab work drawn to assess his dehyration and electrolyte status. Labs ordered at [facility].      Relevant Orders   CBC   Comprehensive metabolic panel   Magnesium   Hyperglycemia    hgba1c acceptable, minimize simple carbs. Increase exercise as tolerated.       Dark stools    Noted since changing meds check labs      Headache    He notes intermittently very bad headaches since his medication changes. He will discuss with cardiology and he is reminded if he has serious headaches that do not resolve he may need to proceed to ER for emergent evaluation. Will order CT scan of head to rule out any concerns. Patient does not note significant headache during visit      Relevant Orders   CBC    Other Visit Diagnoses    Black stool       Relevant Orders   Iron, TIBC and Ferritin Panel   Retic      I am having Darren Mueller "Col" start on mupirocin ointment and diphenoxylate-atropine. I am also having him maintain his diphenoxylate-atropine, Blood Pressure Cuff, acetaminophen, colchicine, Ferrous Fumarate, thiamine, Uptravi, multivitamin, mometasone-formoterol, albuterol, allopurinol, Mag-Oxide, hyoscyamine, folic acid, hydrOXYzine, rosuvastatin, Opsumit, doxepin, Durezol, Advair HFA, torsemide, Potassium Chloride ER, omeprazole, and tadalafil (PAH).  Meds ordered this encounter  Medications  . mupirocin ointment (BACTROBAN) 2 %    Sig: Place 1 application into the nose at bedtime.    Dispense:  22 g    Refill:  0  . diphenoxylate-atropine (LOMOTIL) 2.5-0.025 MG tablet    Sig: Take 1 tablet by mouth 4 (four) times daily as needed for diarrhea or loose stools.    Dispense:  40 tablet    Refill:  1   I discussed the assessment and treatment plan with the patient. The patient was provided an opportunity to ask questions and all  were answered. The patient agreed with the plan and demonstrated an understanding of the instructions.   The patient was advised to call back or seek an in-person evaluation if the symptoms worsen or if the condition fails to improve as anticipated.  I provided 25 minutes of non-face-to-face time during this encounter.   Penni Homans, MD

## 2019-12-02 ENCOUNTER — Telehealth: Payer: Self-pay | Admitting: Emergency Medicine

## 2019-12-02 ENCOUNTER — Other Ambulatory Visit (INDEPENDENT_AMBULATORY_CARE_PROVIDER_SITE_OTHER): Payer: Medicare Other

## 2019-12-02 DIAGNOSIS — R519 Headache, unspecified: Secondary | ICD-10-CM | POA: Diagnosis not present

## 2019-12-02 DIAGNOSIS — R197 Diarrhea, unspecified: Secondary | ICD-10-CM | POA: Diagnosis not present

## 2019-12-02 DIAGNOSIS — K921 Melena: Secondary | ICD-10-CM | POA: Diagnosis not present

## 2019-12-02 DIAGNOSIS — I10 Essential (primary) hypertension: Secondary | ICD-10-CM

## 2019-12-02 LAB — COMPREHENSIVE METABOLIC PANEL
ALT: 8 U/L (ref 0–53)
AST: 32 U/L (ref 0–37)
Albumin: 3.2 g/dL — ABNORMAL LOW (ref 3.5–5.2)
Alkaline Phosphatase: 370 U/L — ABNORMAL HIGH (ref 39–117)
BUN: 48 mg/dL — ABNORMAL HIGH (ref 6–23)
CO2: 25 mEq/L (ref 19–32)
Calcium: 8.4 mg/dL (ref 8.4–10.5)
Chloride: 104 mEq/L (ref 96–112)
Creatinine, Ser: 1.77 mg/dL — ABNORMAL HIGH (ref 0.40–1.50)
GFR: 46.27 mL/min — ABNORMAL LOW (ref >60.00)
Glucose, Bld: 104 mg/dL — ABNORMAL HIGH (ref 70–99)
Potassium: 3.4 mEq/L — ABNORMAL LOW (ref 3.5–5.1)
Sodium: 137 mEq/L (ref 135–145)
Total Bilirubin: 2 mg/dL — ABNORMAL HIGH (ref 0.2–1.2)
Total Protein: 7 g/dL (ref 6.0–8.3)

## 2019-12-02 LAB — CBC
HCT: 29.2 % — ABNORMAL LOW (ref 39.0–52.0)
Hemoglobin: 9.7 g/dL — ABNORMAL LOW (ref 13.0–17.0)
MCHC: 33.2 g/dL (ref 30.0–36.0)
MCV: 102.8 fl — ABNORMAL HIGH (ref 78.0–100.0)
Platelets: 28 10*3/uL — CL (ref 150.0–400.0)
RBC: 2.84 Mil/uL — ABNORMAL LOW (ref 4.22–5.81)
RDW: 17.1 % — ABNORMAL HIGH (ref 11.5–15.5)
WBC: 2.2 10*3/uL — ABNORMAL LOW (ref 4.0–10.5)

## 2019-12-02 LAB — MAGNESIUM: Magnesium: 1.8 mg/dL (ref 1.5–2.5)

## 2019-12-02 NOTE — Telephone Encounter (Signed)
"  CRITICAL VALUE STICKER  CRITICAL VALUE:PLT COUNT 28  RECEIVER (on-site recipient of call):Yanely Mast  DATE & TIME NOTIFIED: 1528 12-02-19  MESSENGER (representative from lab):TONYA  MD NOTIFIED: BLYTH  TIME OF NOTIFICATION:1528 By phone note  RESPONSE:

## 2019-12-02 NOTE — Progress Notes (Signed)
ILR Remote 

## 2019-12-03 LAB — IRON,TIBC AND FERRITIN PANEL
%SAT: 23 % (calc) (ref 20–48)
Ferritin: 42 ng/mL (ref 24–380)
Iron: 75 ug/dL (ref 50–180)
TIBC: 329 mcg/dL (calc) (ref 250–425)

## 2019-12-03 LAB — RETICULOCYTES
ABS Retic: 68640 cells/uL (ref 25000–9000)
Retic Ct Pct: 2.4 %

## 2019-12-04 ENCOUNTER — Other Ambulatory Visit: Payer: Self-pay | Admitting: Family Medicine

## 2019-12-04 DIAGNOSIS — D649 Anemia, unspecified: Secondary | ICD-10-CM

## 2019-12-04 NOTE — Telephone Encounter (Signed)
See result note

## 2019-12-05 ENCOUNTER — Other Ambulatory Visit: Payer: Self-pay

## 2019-12-05 ENCOUNTER — Ambulatory Visit (HOSPITAL_BASED_OUTPATIENT_CLINIC_OR_DEPARTMENT_OTHER)
Admission: RE | Admit: 2019-12-05 | Discharge: 2019-12-05 | Disposition: A | Discharge status: Home / Self Care | Payer: Medicare Other | Source: Ambulatory Visit | Attending: Internal Medicine | Admitting: Internal Medicine

## 2019-12-05 ENCOUNTER — Other Ambulatory Visit (INDEPENDENT_AMBULATORY_CARE_PROVIDER_SITE_OTHER): Payer: Medicare Other

## 2019-12-05 ENCOUNTER — Telehealth: Payer: Self-pay | Admitting: Emergency Medicine

## 2019-12-05 ENCOUNTER — Ambulatory Visit (HOSPITAL_BASED_OUTPATIENT_CLINIC_OR_DEPARTMENT_OTHER)
Admission: RE | Admit: 2019-12-05 | Discharge: 2019-12-05 | Disposition: A | Discharge status: Home / Self Care | Payer: Medicare Other | Source: Ambulatory Visit | Attending: Family Medicine | Admitting: Family Medicine

## 2019-12-05 ENCOUNTER — Telehealth: Payer: Self-pay | Admitting: Physician Assistant

## 2019-12-05 DIAGNOSIS — K7031 Alcoholic cirrhosis of liver with ascites: Secondary | ICD-10-CM | POA: Diagnosis not present

## 2019-12-05 DIAGNOSIS — E876 Hypokalemia: Secondary | ICD-10-CM | POA: Insufficient documentation

## 2019-12-05 DIAGNOSIS — R197 Diarrhea, unspecified: Secondary | ICD-10-CM | POA: Diagnosis not present

## 2019-12-05 DIAGNOSIS — K766 Portal hypertension: Secondary | ICD-10-CM

## 2019-12-05 DIAGNOSIS — I2729 Other secondary pulmonary hypertension: Secondary | ICD-10-CM | POA: Diagnosis not present

## 2019-12-05 DIAGNOSIS — R519 Headache, unspecified: Secondary | ICD-10-CM | POA: Diagnosis not present

## 2019-12-05 DIAGNOSIS — I50810 Right heart failure, unspecified: Secondary | ICD-10-CM | POA: Diagnosis not present

## 2019-12-05 DIAGNOSIS — I5033 Acute on chronic diastolic (congestive) heart failure: Secondary | ICD-10-CM

## 2019-12-05 DIAGNOSIS — I2721 Secondary pulmonary arterial hypertension: Secondary | ICD-10-CM | POA: Diagnosis not present

## 2019-12-05 DIAGNOSIS — I10 Essential (primary) hypertension: Secondary | ICD-10-CM

## 2019-12-05 DIAGNOSIS — K921 Melena: Secondary | ICD-10-CM

## 2019-12-05 LAB — CBC
HCT: 28.1 % — ABNORMAL LOW (ref 39.0–52.0)
Hemoglobin: 9.2 g/dL — ABNORMAL LOW (ref 13.0–17.0)
MCHC: 32.9 g/dL (ref 30.0–36.0)
MCV: 104 fl — ABNORMAL HIGH (ref 78.0–100.0)
Platelets: 25 10*3/uL — CL (ref 150.0–400.0)
RBC: 2.7 Mil/uL — ABNORMAL LOW (ref 4.22–5.81)
RDW: 17.7 % — ABNORMAL HIGH (ref 11.5–15.5)
WBC: 1.8 10*3/uL — CL (ref 4.0–10.5)

## 2019-12-05 LAB — IRON: Iron: 60 ug/dL (ref 42–165)

## 2019-12-05 MED ORDER — POTASSIUM CHLORIDE ER 20 MEQ PO TBCR: EXTENDED_RELEASE_TABLET | ORAL | 5 refills | Status: DC

## 2019-12-05 MED FILL — POTASSIUM CHLORIDE CRYS ER: 20 | 30 days supply | Qty: 210 | Fill #0

## 2019-12-05 NOTE — Addendum Note (Signed)
Addended by: Raliegh Ip on: 12/05/2019 01:57 PM   Modules accepted: Orders

## 2019-12-05 NOTE — Progress Notes (Signed)
Heart Failure TeleHealth Note  Due to national recommendations of social distancing due to COVID 19, Audio/video telehealth visit is felt to be most appropriate for this patient at this time.  See MyChart message from today for patient consent regarding telehealth for [facility].  Date:  12/05/2019   ID:  Darren Mueller, DOB 04-23-1951, MRN [id number]  Location: Home  Provider location: [facility] Type of Visit: Established patient  PCP:  [name], MD  Cardiologist:  [name], MD Primary HF: Darren Mueller  Chief Complaint: Heart Failure follow-up   History of Present Illness:  Darren Mueller ("[name]") is a 68yo with history of COPD, portopulmonary hypertension with RV failure, and ETOH cirrhosis.  Admitted in 6/17 with CP and SOB. Underwent R/L cath. Minimal CAD with moderate PAH and preserved cardiac output.   Admitted 06/2016 and 07/2016 with syncope. On September admission found to have elevated ETOH level as well as R 6th rib fracture. Wore 30 day monitor up until 08/28/16. No AF noted. No events.   Admitted February2018after syncopal event. LINQ placed.   Was admitted 4/20 with hematochezia and melena. His hemoglobin remained stable. No episodes of GI bleed after hospitalization. Underwent EGD by GI with finding of Grade 1 and a small esophageal varices, no evidence of recent bleeding,portal hypertensive gastropathy. Started on nadolol which made him very dizzy. He stopped this and feels a lot   Admitted  7/13-16/20 with volume overload. Diuresed 21 pounds. D/c weight 192.  Echo  05/17/19: LVEF > 65% RV severely dilated with moderate RV dysfunction. Moderate TR RVSP . While in hospital was pancytopenic. Possibility of BMBx discussed with Dr. Clelia Croft but felt it was not necessary as it was felt he had splenic sequestration and counts were low but very stable.  He presents via Web designer for a  telehealth visit today. He has been struggling with fluid overload. Recently metolazone was added but he stopped torsemide and was only taking metolazone. We corrected him and then restarted torsemide 60 bid. Says weight down 215->207 -> 196. Edema much improved but not resolved. Main issue lately has been severe HAs and epistaxis for 2 weeks. Has humidified his room and epistaxis improved. Called PharmD and told HAs may be related to Uptravi. Saw PCP today and had pancytopenia (which is chronic) with platelets at 25k. Sent for stat head CT. (results pending). Denies any other neuro symptoms.    PAH meds 1) Macitnentan 10 mg daily 2) Adcirca 40 mg daily  3) Selexipeg 1000/1200 daily   Studies: ECHO 12/14 EF 55-60% Peak PA pressure 35. Severe RV dysfunction  ECHO 7/15 EF 60% RV moderately to severely dilated. Moderate HK RVSP 57mm HG  ECHO 6/16 EF 60% RV moderately to severely dilated. Severe HK RVSP ~65 mm HG. D-shaped septum Echo 2/17 LVEF 60-65% RV massively dilated. Flat septum. Severe HK. Moderate TR RVSP ~65. IVC small. No effusion  ECHO 01/01/2017 EF 50-55% Grade I DD. RV severely dilated. Peak PA pressure 62 mm hg Echo 9/19 with EF 60-65% RV markedly dilated and hypokinetic with D-shaped septum. RVSP  Cath 6/17 Mid RCA lesion, 20% stenosed. Dist LAD lesion, 20% stenosed. Ao = 107/70 (87) LV = 106/3/11 RA = 4 RV = 74/11 PA = 74/26 (45) PCW = 6 Fick cardiac output/index = 4.6.2.2 PVR = 8.5 Ao sat = 95% PA sat = 61%, 58%  RHC 2/17 RA = 11 RV = 80/16/19 PA = 83/61 (  71) PCW = 9 Fick cardiac output/index = 5.6/2.6 PVR = 10.3 WU Ao sat = 94% PA sat = 65%, 68% High SVC sat = 65%   PFTs 3/14 FEV1 2.02 L (58%) FVC 2.7L (54%) FEV1/FVC (89%) DLCO 53%  PFTs 1/17 FEV1 2.05 L (61%) FVC 2.48L (56%) DLCO 44%   Ab u/s 8/16: + cirrhosis/mild to moderate splenomegaly  6 min walk 01/10/13, 1290 feet  (01/10/14) = 1280 feet (380 m) (9/15) = 1350  feet (411 m) O2 sats ranged from 89-95% on room air, HR ranged from 100-129. (6/16) = 384 meters  (2/17) = 1250 feet (334m) (8/17) = 1320 feet    Darren Mueller denies symptoms worrisome for COVID 19.   Past Medical History:  Diagnosis Date  . Alcohol dependence (HCC) 03/22/2011   In remission   . Alcohol dependence in remission (HCC) 03/22/2011   In remission   . Alcoholic cirrhosis of liver with ascites (HCC) 2014  . Anginal pain (HCC)    with SOB admitted 04/20/2019  . Cardiomegaly 02/2019   Noted on CXR  . CHF (congestive heart failure) (HCC)   . CKD (chronic kidney disease), stage III 07/18/2013  . COPD (chronic obstructive pulmonary disease) (HCC)   . Coronary artery disease   . Dermatitis 06/10/2015  . Diarrhea 09/04/2013  . Dyspnea    admitted 04/20/2019  . Elbow pain, left 03/17/2017  . Eustachian tube dysfunction   . GERD (gastroesophageal reflux disease)   . Gout   . Headache   . Hearing loss   . Heart murmur   . Hx of colonic polyps   . Hypertension   . Hypertriglyceridemia 03/17/2017  . Hypopotassemia   . Otalgia 11/28/2013  . Other chronic pulmonary heart diseases   . PAH (pulmonary artery hypertension) (HCC)    RV failure  . Pancytopenia (HCC)   . Pleural effusion 2014   Noted on CT:   Moderately large simple layering right pleural effusion   . Preventative health care 06/29/2016  . Pulmonary nodule 2014   Noted on CT: An 8 mm pulmonary nodule in the periphery of the left lower lobe  . Rectal bleeding   . Red eye 03/03/2016   right  . Seizures (HCC)   . Sleep apnea    does not wear CPAP  . Splenomegaly 12/2015   Noted on CT  . Thoracic aorta atherosclerosis (HCC) 12/2017  . Thrombocytopenia (HCC) 06/29/2016  . Unspecified hypothyroidism 01/25/2013  . Unspecified pleural effusion   . Vertigo   . Wears glasses    Past Surgical History:  Procedure Laterality Date  . CARDIAC CATHETERIZATION N/A 12/21/2015   Procedure: Right Heart Cath;   Surgeon: Dolores Patty, MD;  Location: [facility] INVASIVE CV LAB;  Service: Cardiovascular;  Laterality: N/A;  . CARDIAC CATHETERIZATION N/A 04/15/2016   Procedure: Right/Left Heart Cath and Coronary Angiography;  Surgeon: Dolores Patty, MD;  Location: [facility] INVASIVE CV LAB;  Service: Cardiovascular;  Laterality: N/A;  . COLONOSCOPY N/A 12/08/2013   Procedure: COLONOSCOPY;  Surgeon: Rachael Fee, MD;  Location: [facility] ENDOSCOPY;  Service: Endoscopy;  Laterality: N/A;  . COLONOSCOPY WITH PROPOFOL N/A 03/24/2019   Procedure: COLONOSCOPY WITH PROPOFOL;  Surgeon: Rachael Fee, MD;  Location: [facility] ENDOSCOPY;  Service: Endoscopy;  Laterality: N/A;  Draw CBC in preop  . COLONOSCOPY WITH PROPOFOL N/A 04/28/2019   Procedure: COLONOSCOPY WITH PROPOFOL;  Surgeon: Rachael Fee, MD;  Location: [facility] ENDOSCOPY;  Service:  Endoscopy;  Laterality: N/A;  . ESOPHAGOGASTRODUODENOSCOPY (EGD) WITH PROPOFOL N/A 03/27/2016   Procedure: ESOPHAGOGASTRODUODENOSCOPY (EGD) WITH PROPOFOL;  Surgeon: Rachael Fee, MD;  Location: [facility] ENDOSCOPY;  Service: Endoscopy;  Laterality: N/A;  . ESOPHAGOGASTRODUODENOSCOPY (EGD) WITH PROPOFOL N/A 02/14/2019   Procedure: ESOPHAGOGASTRODUODENOSCOPY (EGD) WITH PROPOFOL;  Surgeon: Beverley Fiedler, MD;  Location: [facility] ENDOSCOPY;  Service: Gastroenterology;  Laterality: N/A;  . EYE SURGERY Bilateral 03/15/2019   lasix  . GAS/FLUID EXCHANGE Right 09/22/2019   Procedure: GAS/FLUID EXCHANGE (C3F8) RIGHT EYE;  Surgeon: Stephannie Li, MD;  Location: [facility] OR;  Service: Ophthalmology;  Laterality: Right;  . HEMOSTASIS CLIP PLACEMENT  04/28/2019   Procedure: HEMOSTASIS CLIP PLACEMENT;  Surgeon: Rachael Fee, MD;  Location: [facility] ENDOSCOPY;  Service: Endoscopy;;  . LOOP RECORDER INSERTION N/A 01/01/2017   Procedure: Loop Recorder Insertion;  Surgeon: Hillis Range, MD;  Location: [facility] INVASIVE CV LAB;  Service: Cardiovascular;  Laterality: N/A;  . PARS PLANA VITRECTOMY Right 09/22/2019   Procedure: PARS PLANA  VITRECTOMY WITH 25 GAUGE RIGHT EYE;  Surgeon: Stephannie Li, MD;  Location: [facility] OR;  Service: Ophthalmology;  Laterality: Right;  . PHOTOCOAGULATION WITH LASER Right 09/22/2019   Procedure: PHOTOCOAGULATION WITH LASER RIGHT EYE;  Surgeon: Stephannie Li, MD;  Location: [facility] OR;  Service: Ophthalmology;  Laterality: Right;  . POLYPECTOMY  04/28/2019   Procedure: POLYPECTOMY;  Surgeon: Rachael Fee, MD;  Location: [facility] ENDOSCOPY;  Service: Endoscopy;;  . RIGHT HEART CATHETERIZATION Right 06/06/2014   Procedure: RIGHT HEART CATH;  Surgeon: Dolores Patty, MD;  Location: [facility] CATH LAB;  Service: Cardiovascular;  Laterality: Right;  . UVULOPALATOPHARYNGOPLASTY  1999     Current Outpatient Medications  Medication Sig Dispense Refill  . acetaminophen (TYLENOL) 500 MG tablet Take 1,000 mg by mouth every 6 (six) hours as needed (for pain.).    Marland Kitchen ADVAIR HFA 230-21 MCG/ACT inhaler USE 2 INHALATIONS EVERY MORNING 48 g 5  . albuterol (VENTOLIN HFA) 108 (90 Base) MCG/ACT inhaler Inhale 2 puffs into the lungs every 6 (six) hours as needed for wheezing or shortness of breath.    . allopurinol (ZYLOPRIM) 100 MG tablet Take 2 tablets (200 mg total) by mouth daily. 180 tablet 1  . Blood Pressure Monitoring (BLOOD PRESSURE CUFF) MISC Use as directed. Check blood pressure bid prn Dx:I10 1 each 0  . colchicine 0.6 MG tablet Take 0.6 mg by mouth daily as needed (gout flare).    . Difluprednate (DUREZOL) 0.05 % EMUL Place 1 drop into the left eye daily.    . diphenoxylate-atropine (LOMOTIL) 2.5-0.025 MG tablet Take 1 tablet by mouth 3 (three) times daily. 60 tablet 0  . diphenoxylate-atropine (LOMOTIL) 2.5-0.025 MG tablet Take 1 tablet by mouth 4 (four) times daily as needed for diarrhea or loose stools. 40 tablet 1  . doxepin (SINEQUAN) 10 MG capsule Take 10-20 mg by mouth at bedtime.    . Ferrous Fumarate (HEMOCYTE) 324 (106 Fe) MG TABS tablet Take 1 tablet (106 mg of iron total) by mouth daily. 30 tablet 5  .  folic acid (FOLVITE) 1 MG tablet Take 1 tablet (1 mg total) by mouth daily. 30 tablet 5  . hydrOXYzine (ATARAX/VISTARIL) 25 MG tablet Take 1 tablet (25 mg total) by mouth 3 (three) times daily as needed. 30 tablet 0  . hyoscyamine (LEVSIN SL) 0.125 MG SL tablet Place 1 tablet (0.125 mg total) under the tongue every 6 (six) hours as needed. 30 tablet 1  . macitentan (OPSUMIT) 10 MG tablet  Take 1 tablet (10 mg total) by mouth daily. 90 tablet 3  . Magnesium Oxide (MAG-OXIDE) 200 MG TABS Take 2 tablets (400 mg total) by mouth daily. 180 tablet 3  . mometasone-formoterol (DULERA) 100-5 MCG/ACT AERO Inhale 2 puffs into the lungs 2 (two) times daily.    . Multiple Vitamin (MULTIVITAMIN) tablet Take 1 tablet by mouth daily.    . mupirocin ointment (BACTROBAN) 2 % Place 1 application into the nose at bedtime. 22 g 0  . omeprazole (PRILOSEC) 40 MG capsule TAKE 1 CAPSULE BY MOUTH ONCE DAILY 90 capsule 0  . potassium chloride 20 MEQ TBCR Take 3 tablets( ) by mouth twice daily 90 tablet 5  . rosuvastatin (CRESTOR) 5 MG tablet TAKE 1 TABLET DAILY 90 tablet 3  . Selexipag (UPTRAVI) 1000 MCG TABS Take 1,000 mcg by mouth 2 (two) times daily. 60 tablet 11  . tadalafil, PAH, (ADCIRCA) 20 MG tablet Take 2 tablets (40 mg total) by mouth daily. 180 tablet 1  . thiamine 100 MG tablet Take 1 tablet (100 mg total) by mouth daily. 30 tablet 5  . torsemide (DEMADEX) 20 MG tablet Take 3 tablets (60 mg total) by mouth 2 (two) times daily. 180 tablet 5   No current facility-administered medications for this encounter.    Allergies:   Aspirin and Nitroglycerin   Social History:  The patient  reports that he quit smoking about 40 years ago. His smoking use included cigarettes. He has a 10.00 pack-year smoking history. He has never used smokeless tobacco. He reports current alcohol use of about 6.0 standard drinks of alcohol per week. He reports that he does not use drugs.   Family History:  The patient's family history  includes Alcoholism in his maternal grandfather; Cancer in his father; Gout in his brother; Heart attack in his mother; Heart disease in his mother; Hypertension in his brother, brother, and mother; Kidney failure in his mother; Liver disease in his maternal grandfather and mother; Migraines in his sister; Prostate cancer in his father.   ROS:  Please see the history of present illness.   All other systems are personally reviewed and negative.   Exam:  (Video/Tele Health Call; Exam is subjective and or/visual.) General:  Speaks in full sentences. No resp difficulty. Lungs: Normal respiratory effort with conversation.  Abdomen: mildly distended (improved) per patient report Extremities: Patient endorses mild edema. Says he is wearing compression stockings. . Wearing hose Neuro: Alert & oriented x 3.   Recent Labs: 05/16/2019: B Natriuretic Peptide 1,119.2 06/02/2019: TSH 2.50 10/11/2019: Pro B Natriuretic peptide (BNP) 604.0 12/02/2019: ALT 8; BUN 48; Creatinine, Ser 1.77; Magnesium 1.8; Potassium 3.4; Sodium 137 12/05/2019: Hemoglobin 9.2; Platelets 25.0 Repeated and verified X2.  Personally reviewed   Wt Readings from Last 3 Encounters:  12/01/19 90.7 kg (200 lb)  10/18/19 103 kg (227 lb)  10/11/19 103.1 kg (227 lb 3.2 oz)      ASSESSMENT AND PLAN:  1. PAH: Suspected portopulmonary HTN in setting of ETOH cirrhosis. - Remains NYHA III now with worsening R-sided HF and volume overload in setting of ongoing inability to manage his own diuretic regimen - 9/19 1280 feet (390 meters) - Echo 9/19 with EF 60-65% RV markedly dilated and hypokinetic with D-shaped septum - Echo  05/17/19: LVEF > 65% RV severely dilated with moderate RV dysfunction. Septal flattening Moderate TR RVSP . - Continue selexipag 1000 mcg twice a day. Intolerant higher dose due to diarrhea and headaches . - Continue macitentan  10 daily and adcirca 40 mg daily. - With RV strain on echo may need to consider repeat  RHC and  IV therapies in the near future but doubt he will be candidate for this   2. Acute on chronic diastolic HF - Continues to struggle with volume overload and cardiorenal syndrome (baseline weight likely closer to 190-195). He is 203 but further diuresis limited by AKI - Now back on torsemide 60 bid. Weight improved. K low at 3.4 today. Told him to increase kdur to 4 tabs ( ) in am and 3 ( ) in pm.   3. Headaches and epistaxis - HAs could definitely come from selexipag but would be unusual to get acutely worse without changing dose. - he has chronic pancytopenia due to liver disease (followed by Dr. Clelia Croft) - await results of head CT  4. Acute CKD III:  -Baseline creatinine 1.7-2.0.  - Creatinine 1.77 today - Followed by Dr. Arrie Aran  - Change diuretics as above. Repeat labs this week  5. Hypokalemia - K 3.4 today. Increse potassium as above  5. Cirrhosis:  - Likely combination of RV failure and ETOH - Follows with Dr. Christella Hartigan. - Did not tolerate nadolol.  - EGD 4/20 stable - Stressed need for ETOH cessation  7. Recurrent syncope/seizure - LINQ interrogations have shown no arrhythmias and thus most likely seizure - Continue Keppra. Follows with Dr. Karel Jarvis  8. OSA:  - Has mild OSA with AHI 12 and desats down to 82%.  - Intolerant CPAP.   9. Pancytopenia:  - Has seen Dr. Clelia Croft in Hematology - felt like it is due to splenic sequestration.  - No role for BMBx current per Dr. Clelia Croft - Counts have not changed appreciably but are very low. Agree with referral back to Dr. Clelia Croft.   10. Hypomag - continue magOx 400 daily  COVID screen The patient does not have any symptoms that suggest any further testing/ screening at this time. Social distancing reinforced today.  Patient Risk: After full review of this patients clinical status, I feel that they are at moderate risk for cardiac decompensation at this time.  Relevant cardiac medications were reviewed at  length with the patient today. The patient does not have concerns regarding their medications at this time.   Recommended follow-up:1 month televisit  Today, I have spent22 minutes with the patient with telehealth technology discussing the above issues .   Signed, [name], MD  12/05/2019 4:57 PM  [facility] [address] [facility] [place] [address] [phone] (office) [phone] (fax)

## 2019-12-05 NOTE — Addendum Note (Signed)
Encounter addended by: Scarlette Calico, RN on: 12/05/2019 5:27 PM  Actions taken: Clinical Note Signed, Pharmacy for encounter modified, Order list changed

## 2019-12-05 NOTE — Telephone Encounter (Signed)
   Pt's [place] called to clarify potassium sig as it came across with 2 instructions ["Take 80 mEq by mouth every morning AND 60 mEq every evening. Take 3 tablets( 44mq) by mouth twice daily."] Relayed Dr. BClayborne Danainstruction as per clinic note today which was the former, to take 845m QAM/6067mQPM. Will route to CHF clinic staff as FYI to correct in chart. Also called to make sure patient aware in case he got AVS with this info - he verbalized understanding.  DayCharlie PitterA-C

## 2019-12-05 NOTE — Progress Notes (Signed)
Per Dr Haroldine Laws:  Increase kdur to 80/60   televisit 1 month

## 2019-12-05 NOTE — Progress Notes (Signed)
Spoke w/pt via phone, he is aware, agreeable and understands med change, new rx sent in, f/u appt sch for 3/5 (virtual)

## 2019-12-05 NOTE — Telephone Encounter (Signed)
"  CRITICAL VALUE STICKER  CRITICAL VALUE:White Ct 1.8  Plt Count 25  RECEIVER (on-site recipient of call):Diannia Hogenson P  DATE & TIME NOTIFIED: 11-04-19  MESSENGER (representative from lab):Kenney Houseman  MD NOTIFIED: Charlett Blake  TIME OF NOTIFICATION:1440 RESPONSE:

## 2019-12-05 NOTE — Telephone Encounter (Signed)
I have placed the referral to hematology/oncology

## 2019-12-05 NOTE — Addendum Note (Signed)
Addended by: KING, Tiquan Bouch M on: 12/05/2019 01:57 PM   Modules accepted: Orders  

## 2019-12-06 MED ORDER — POTASSIUM CHLORIDE ER 20 MEQ PO TBCR: EXTENDED_RELEASE_TABLET | ORAL | 5 refills | Status: DC

## 2019-12-06 MED FILL — OMEPRAZOLE 40 MG CPDR: 40 | 90 days supply | Qty: 90 | Fill #0

## 2019-12-06 MED FILL — FOLIC ACID 1 MG TABS: 1 | 30 days supply | Qty: 30 | Fill #2

## 2019-12-06 NOTE — Telephone Encounter (Signed)
Med list updated w/correct directions for [facility] (80 meq in AM and 60 meq in PM)

## 2019-12-06 NOTE — Addendum Note (Signed)
Addended by: Scarlette Calico on: 12/06/2019 09:02 AM   Modules accepted: Orders

## 2019-12-07 ENCOUNTER — Other Ambulatory Visit: Payer: Self-pay

## 2019-12-08 ENCOUNTER — Ambulatory Visit (HOSPITAL_BASED_OUTPATIENT_CLINIC_OR_DEPARTMENT_OTHER)
Admission: RE | Admit: 2019-12-08 | Discharge: 2019-12-08 | Disposition: A | Discharge status: Home / Self Care | Payer: Medicare Other | Source: Ambulatory Visit | Attending: Medical | Admitting: Medical

## 2019-12-08 ENCOUNTER — Ambulatory Visit (INDEPENDENT_AMBULATORY_CARE_PROVIDER_SITE_OTHER): Payer: Medicare Other | Admitting: Medical

## 2019-12-08 ENCOUNTER — Telehealth: Payer: Self-pay | Admitting: Emergency Medicine

## 2019-12-08 ENCOUNTER — Other Ambulatory Visit: Payer: Self-pay

## 2019-12-08 VITALS — BP 111/65 | HR 58 | Temp 97.4°F | Resp 12 | Ht 72.0 in | Wt 224.8 lb

## 2019-12-08 DIAGNOSIS — D649 Anemia, unspecified: Secondary | ICD-10-CM

## 2019-12-08 DIAGNOSIS — I509 Heart failure, unspecified: Secondary | ICD-10-CM

## 2019-12-08 DIAGNOSIS — N183 Chronic kidney disease, stage 3 unspecified: Secondary | ICD-10-CM | POA: Diagnosis not present

## 2019-12-08 DIAGNOSIS — R252 Cramp and spasm: Secondary | ICD-10-CM

## 2019-12-08 DIAGNOSIS — R0600 Dyspnea, unspecified: Secondary | ICD-10-CM | POA: Diagnosis not present

## 2019-12-08 LAB — COMPREHENSIVE METABOLIC PANEL
ALT: 8 U/L (ref 0–53)
AST: 36 U/L (ref 0–37)
Albumin: 3.2 g/dL — ABNORMAL LOW (ref 3.5–5.2)
Alkaline Phosphatase: 432 U/L — ABNORMAL HIGH (ref 39–117)
BUN: 40 mg/dL — ABNORMAL HIGH (ref 6–23)
CO2: 22 mEq/L (ref 19–32)
Calcium: 8.2 mg/dL — ABNORMAL LOW (ref 8.4–10.5)
Chloride: 107 mEq/L (ref 96–112)
Creatinine, Ser: 2.08 mg/dL — ABNORMAL HIGH (ref 0.40–1.50)
GFR: 38.41 mL/min — ABNORMAL LOW (ref >60.00)
Glucose, Bld: 93 mg/dL (ref 70–99)
Potassium: 4.2 mEq/L (ref 3.5–5.1)
Sodium: 135 mEq/L (ref 135–145)
Total Bilirubin: 1.8 mg/dL — ABNORMAL HIGH (ref 0.2–1.2)
Total Protein: 7 g/dL (ref 6.0–8.3)

## 2019-12-08 LAB — BRAIN NATRIURETIC PEPTIDE: Pro B Natriuretic peptide (BNP): 710 pg/mL — ABNORMAL HIGH (ref 0.0–100.0)

## 2019-12-08 LAB — CBC WITH DIFFERENTIAL/PLATELET
Basophils Absolute: 0 10*3/uL (ref 0.0–0.1)
Basophils Relative: 0.7 % (ref 0.0–3.0)
Eosinophils Absolute: 0 10*3/uL (ref 0.0–0.7)
Eosinophils Relative: 1.9 % (ref 0.0–5.0)
HCT: 29 % — ABNORMAL LOW (ref 39.0–52.0)
Hemoglobin: 9.4 g/dL — ABNORMAL LOW (ref 13.0–17.0)
Lymphocytes Relative: 26.1 % (ref 12.0–46.0)
Lymphs Abs: 0.5 10*3/uL — ABNORMAL LOW (ref 0.7–4.0)
MCHC: 32.6 g/dL (ref 30.0–36.0)
MCV: 106.1 fl — ABNORMAL HIGH (ref 78.0–100.0)
Monocytes Absolute: 0.3 10*3/uL (ref 0.1–1.0)
Monocytes Relative: 13.1 % — ABNORMAL HIGH (ref 3.0–12.0)
Neutro Abs: 1.2 10*3/uL — ABNORMAL LOW (ref 1.4–7.7)
Neutrophils Relative %: 58.2 % (ref 43.0–77.0)
Platelets: 23 10*3/uL — CL (ref 150.0–400.0)
RBC: 2.73 Mil/uL — ABNORMAL LOW (ref 4.22–5.81)
RDW: 18.6 % — ABNORMAL HIGH (ref 11.5–15.5)
WBC: 2 10*3/uL — ABNORMAL LOW (ref 4.0–10.5)

## 2019-12-08 LAB — MAGNESIUM: Magnesium: 1.8 mg/dL (ref 1.5–2.5)

## 2019-12-08 NOTE — Telephone Encounter (Signed)
"  CRITICAL VALUE STICKER  CRITICAL VALUE:Plt Count 23 [address] 2.0  RECEIVER (on-site recipient of call):Darren Mueller  DATE & TIME NOTIFIED: 1302, 12-08-19  MESSENGER (representative from lab):Hope  MD NOTIFIED: [place]  TIME OF NOTIFICATION:1303  RESPONSE:

## 2019-12-08 NOTE — Progress Notes (Signed)
Subjective:    Patient ID: Darren Mueller, male    DOB: [birth year], 69 y.o.   MRN: [id number]  HPI  Pt in for follow up.  Pt just saw Dr. Haroldine Laws on 2-1-20221. Pt made some adjustment to his diuretic treatment and gave rx of potassium.   I don't see completed note.   Pt weight is 224 lb(fully clothed). He states weight was 201 lb at his house with just shorts.  Last couple of day he states very short of breath.   Pt mentioned that his cardiologist had mentioned admitting to hospital. But sounds like decided to try outpatient rx.  Pt tells me he is on torsemid 20 mg 3 in morning and 2 at night.  Last rx sig states 3 tab po twice daily. He has old rx in December taht states 3 tab in am and 2 tab in pm.  Pt has metolazone   Review of Systems  Constitutional: Negative for chills, fatigue and fever.  HENT: Negative for congestion.   Respiratory: Positive for shortness of breath. Negative for cough, chest tightness and wheezing.   Cardiovascular: Negative for chest pain and palpitations.  Gastrointestinal: Negative for abdominal pain.  Musculoskeletal: Negative for back pain and neck pain.  Neurological: Negative for dizziness, weakness and light-headedness.  Hematological: Negative for adenopathy. Does not bruise/bleed easily.  Psychiatric/Behavioral: Negative for behavioral problems, confusion and sleep disturbance. The patient is not nervous/anxious.     Past Medical History:  Diagnosis Date  . Alcohol dependence ([place]) 03/22/2011   In remission   . Alcohol dependence in remission ([place]) 03/22/2011   In remission   . Alcoholic cirrhosis of liver with ascites ([place]) 2014  . Anginal pain ([place])    with SOB admitted 04/20/2019  . Cardiomegaly 02/2019   Noted on CXR  . CHF (congestive heart failure) ([place])   . CKD (chronic kidney disease), stage III 07/18/2013  . COPD (chronic obstructive pulmonary disease) ([place])   . Coronary artery disease   . Dermatitis 06/10/2015  .  Diarrhea 09/04/2013  . Dyspnea    admitted 04/20/2019  . Elbow pain, left 03/17/2017  . Eustachian tube dysfunction   . GERD (gastroesophageal reflux disease)   . Gout   . Headache   . Hearing loss   . Heart murmur   . Hx of colonic polyps   . Hypertension   . Hypertriglyceridemia 03/17/2017  . Hypopotassemia   . Otalgia 11/28/2013  . Other chronic pulmonary heart diseases   . PAH (pulmonary artery hypertension) ([facility])    RV failure  . Pancytopenia ([place])   . Pleural effusion 2014   Noted on CT:   Moderately large simple layering right pleural effusion   . Preventative health care 06/29/2016  . Pulmonary nodule 2014   Noted on CT: An 8 mm pulmonary nodule in the periphery of the left lower lobe  . Rectal bleeding   . Red eye 03/03/2016   right  . Seizures ([place])   . Sleep apnea    does not wear CPAP  . Splenomegaly 12/2015   Noted on CT  . Thoracic aorta atherosclerosis ([place]) 12/2017  . Thrombocytopenia ([place]) 06/29/2016  . Unspecified hypothyroidism 01/25/2013  . Unspecified pleural effusion   . Vertigo   . Wears glasses      Social History   Socioeconomic History  . Marital status: Divorced    Spouse name: Not on file  . Number of children: 3  . Years of education: Not on  file  . Highest education level: Not on file  Occupational History  . Occupation: Retired    Comment: Social research officer, government  Tobacco Use  . Smoking status: Former Smoker    Packs/day: 2.00    Years: 5.00    Pack years: 10.00    Types: Cigarettes    Quit date: 09/22/1979    Years since quitting: 40.2  . Smokeless tobacco: Never Used  Substance and Sexual Activity  . Alcohol use: Yes    Alcohol/week: 6.0 standard drinks    Types: 6 Shots of liquor per week    Comment: social drinker  . Drug use: No  . Sexual activity: Yes    Birth control/protection: Spermicide  Other Topics Concern  . Not on file  Social History Narrative   0 caffeine drinks daily    Social Determinants of Health   Financial  Resource Strain: Low Risk   . Difficulty of Paying Living Expenses: Not hard at all  Food Insecurity: No Food Insecurity  . Worried About Charity fundraiser in the Last Year: Never true  . Ran Out of Food in the Last Year: Never true  Transportation Needs: No Transportation Needs  . Lack of Transportation (Medical): No  . Lack of Transportation (Non-Medical): No  Physical Activity: Inactive  . Days of Exercise per Week: 0 days  . Minutes of Exercise per Session: 0 min  Stress: Stress Concern Present  . Feeling of Stress : Rather much  Social Connections: Slightly Isolated  . Frequency of Communication with Friends and Family: More than three times a week  . Frequency of Social Gatherings with Friends and Family: Three times a week  . Attends Religious Services: More than 4 times per year  . Active Member of Clubs or Organizations: Yes  . Attends Archivist Meetings: More than 4 times per year  . Marital Status: Divorced  Human resources officer Violence:   . Fear of Current or Ex-Partner: Not on file  . Emotionally Abused: Not on file  . Physically Abused: Not on file  . Sexually Abused: Not on file    Past Surgical History:  Procedure Laterality Date  . CARDIAC CATHETERIZATION N/A 12/21/2015   Procedure: Right Heart Cath;  Surgeon: Jolaine Artist, MD;  Location: [facility];  Service: Cardiovascular;  Laterality: N/A;  . CARDIAC CATHETERIZATION N/A 04/15/2016   Procedure: Right/Left Heart Cath and Coronary Angiography;  Surgeon: Jolaine Artist, MD;  Location: [facility];  Service: Cardiovascular;  Laterality: N/A;  . COLONOSCOPY N/A 12/08/2013   Procedure: COLONOSCOPY;  Surgeon: Milus Banister, MD;  Location: [facility];  Service: Endoscopy;  Laterality: N/A;  . COLONOSCOPY WITH PROPOFOL N/A 03/24/2019   Procedure: COLONOSCOPY WITH PROPOFOL;  Surgeon: Milus Banister, MD;  Location: [facility];  Service: Endoscopy;  Laterality: N/A;  Draw CBC in preop    . COLONOSCOPY WITH PROPOFOL N/A 04/28/2019   Procedure: COLONOSCOPY WITH PROPOFOL;  Surgeon: Milus Banister, MD;  Location: [facility];  Service: Endoscopy;  Laterality: N/A;  . ESOPHAGOGASTRODUODENOSCOPY (EGD) WITH PROPOFOL N/A 03/27/2016   Procedure: ESOPHAGOGASTRODUODENOSCOPY (EGD) WITH PROPOFOL;  Surgeon: Milus Banister, MD;  Location: [facility];  Service: Endoscopy;  Laterality: N/A;  . ESOPHAGOGASTRODUODENOSCOPY (EGD) WITH PROPOFOL N/A 02/14/2019   Procedure: ESOPHAGOGASTRODUODENOSCOPY (EGD) WITH PROPOFOL;  Surgeon: Jerene Bears, MD;  [facility];  Service: Gastroenterology;  Laterality: N/A;  . EYE SURGERY Bilateral 03/15/2019   lasix  . GAS/FLUID EXCHANGE Right  09/22/2019   Procedure: GAS/FLUID EXCHANGE (C3F8) RIGHT EYE;  Surgeon: Sherlynn Stalls, MD;  Location: [facility];  Service: Ophthalmology;  Laterality: Right;  . HEMOSTASIS CLIP PLACEMENT  04/28/2019   Procedure: HEMOSTASIS CLIP PLACEMENT;  Surgeon: Milus Banister, MD;  Location: [facility];  Service: Endoscopy;;  . LOOP RECORDER INSERTION N/A 01/01/2017   Procedure: Loop Recorder Insertion;  Surgeon: Thompson Grayer, MD;  Location: [facility];  Service: Cardiovascular;  Laterality: N/A;  . PARS PLANA VITRECTOMY Right 09/22/2019   Procedure: PARS PLANA VITRECTOMY WITH 25 GAUGE RIGHT EYE;  Surgeon: Sherlynn Stalls, MD;  Location: [facility];  Service: Ophthalmology;  Laterality: Right;  . PHOTOCOAGULATION WITH LASER Right 09/22/2019   Procedure: PHOTOCOAGULATION WITH LASER RIGHT EYE;  Surgeon: Sherlynn Stalls, MD;  Location: [facility];  Service: Ophthalmology;  Laterality: Right;  . POLYPECTOMY  04/28/2019   Procedure: POLYPECTOMY;  Surgeon: Milus Banister, MD;  Location: [facility];  Service: Endoscopy;;  . RIGHT HEART CATHETERIZATION Right 06/06/2014   Procedure: RIGHT HEART CATH;  Surgeon: Jolaine Artist, MD;  [facility];  Service: Cardiovascular;  Laterality: Right;  . UVULOPALATOPHARYNGOPLASTY  1999     Family History  Problem Relation Age of Onset  . Heart disease Mother   . Heart attack Mother   . Hypertension Mother   . Kidney failure Mother   . Liver disease Mother        stage 4 liver disease  . Prostate cancer Father   . Cancer Father        lung  . Hypertension Brother   . Gout Brother   . Alcoholism Maternal Grandfather   . Liver disease Maternal Grandfather   . Migraines Sister   . Hypertension Brother   . Colon cancer Neg Hx     Allergies  Allergen Reactions  . Aspirin Hypertension  . Nitroglycerin Other (See Comments)    Headache    Current Outpatient Medications on File Prior to Visit  Medication Sig Dispense Refill  . acetaminophen (TYLENOL) 500 MG tablet Take 1,000 mg by mouth every 6 (six) hours as needed (for pain.).    Marland Kitchen ADVAIR HFA 230-21 MCG/ACT inhaler USE 2 INHALATIONS EVERY MORNING 48 g 5  . albuterol (VENTOLIN HFA) 108 (90 Base) MCG/ACT inhaler Inhale 2 puffs into the lungs every 6 (six) hours as needed for wheezing or shortness of breath.    . allopurinol (ZYLOPRIM) 100 MG tablet Take 2 tablets (200 mg total) by mouth daily. 180 tablet 1  . Blood Pressure Monitoring (BLOOD PRESSURE CUFF) MISC Use as directed. Check blood pressure bid prn Dx:I10 1 each 0  . colchicine 0.6 MG tablet Take 0.6 mg by mouth daily as needed (gout flare).    . Difluprednate (DUREZOL) 0.05 % EMUL Place 1 drop into the left eye daily.    . diphenoxylate-atropine (LOMOTIL) 2.5-0.025 MG tablet Take 1 tablet by mouth 3 (three) times daily. 60 tablet 0  . diphenoxylate-atropine (LOMOTIL) 2.5-0.025 MG tablet Take 1 tablet by mouth 4 (four) times daily as needed for diarrhea or loose stools. 40 tablet 1  . doxepin (SINEQUAN) 10 MG capsule Take 10-20 mg by mouth at bedtime.    . Ferrous Fumarate (HEMOCYTE) 324 (106 Fe) MG TABS tablet Take 1 tablet (106 mg of iron total) by mouth daily. 30 tablet 5  . folic acid (FOLVITE) 1 MG tablet Take 1 tablet (1 mg total) by mouth daily. 30  tablet 5  . hydrOXYzine (ATARAX/VISTARIL) 25  MG tablet Take 1 tablet (25 mg total) by mouth 3 (three) times daily as needed. 30 tablet 0  . hyoscyamine (LEVSIN SL) 0.125 MG SL tablet Place 1 tablet (0.125 mg total) under the tongue every 6 (six) hours as needed. 30 tablet 1  . macitentan (OPSUMIT) 10 MG tablet Take 1 tablet (10 mg total) by mouth daily. 90 tablet 3  . Magnesium Oxide (MAG-OXIDE) 200 MG TABS Take 2 tablets (400 mg total) by mouth daily. 180 tablet 3  . mometasone-formoterol (DULERA) 100-5 MCG/ACT AERO Inhale 2 puffs into the lungs 2 (two) times daily.    . Multiple Vitamin (MULTIVITAMIN) tablet Take 1 tablet by mouth daily.    . mupirocin ointment (BACTROBAN) 2 % Place 1 application into the nose at bedtime. 22 g 0  . omeprazole (PRILOSEC) 40 MG capsule TAKE 1 CAPSULE BY MOUTH ONCE DAILY 90 capsule 0  . Potassium Chloride ER 20 MEQ TBCR Take 80 mEq by mouth every morning AND 60 mEq every evening. 210 tablet 5  . rosuvastatin (CRESTOR) 5 MG tablet TAKE 1 TABLET DAILY 90 tablet 3  . Selexipag (UPTRAVI) 1000 MCG TABS Take 1,000 mcg by mouth 2 (two) times daily. 60 tablet 11  . tadalafil, PAH, (ADCIRCA) 20 MG tablet Take 2 tablets (40 mg total) by mouth daily. 180 tablet 1  . thiamine 100 MG tablet Take 1 tablet (100 mg total) by mouth daily. 30 tablet 5  . torsemide (DEMADEX) 20 MG tablet Take 3 tablets (60 mg total) by mouth 2 (two) times daily. 180 tablet 5   No current facility-administered medications on file prior to visit.    BP 111/65 (BP Location: Right Arm, Cuff Size: Normal)   Pulse (!) 58   Temp (!) 97.4 F (36.3 C) (Temporal)   Resp 12   Ht 6' (1.829 m)   Wt 224 lb 12.8 oz (102 kg)   SpO2 91%   BMI 30.49 kg/m       Objective:   Physical Exam  General Mental Status- Alert. General Appearance- Not in acute distress.   Skin General: Color- Normal Color. Moisture- Normal Moisture.  Neck Carotid Arteries- Normal color. Moisture- Normal Moisture. No  carotid bruits. No JVD.  Chest and Lung Exam Auscultation: Breath Sounds:-Normal.  Cardiovascular Auscultation:Rythm- Regular. Murmurs & Other Heart Sounds:Auscultation of the heart reveals- No Murmurs.  Abdomen Inspection:-Inspeection Normal. Palpation/Percussion:Note:No mass. Palpation and Percussion of the abdomen reveal- Non Tender, Non Distended + BS, no rebound or guarding.   Neurologic Cranial Nerve exam:- CN III-XII intact(No nystagmus), symmetric smile. Strength:- 5/5 equal and symmetric strength both upper and lower extremities.  Lower ext- severe edema. But similar to last time I saw him.(2-3+ bilateral.). negative homans signs.    Assessment & Plan:  [phone]  You do report weight gain since Monday of about 20 lbs but different scale and you had minimal clothing at home. So want you to weigh yourself when you get home on same scale with same clothes.  We need to get stat labs and cxr to assess degree of chf. Will send your cardiologist note to opinion on modification to regimen. May need to make temporary modification to regimen if significant finding while we wait for cardiolgist response.  If severe sign/symptom change then ED evaluation.  Follow up date to be determined  30 + minutes spent with pt today. 50% of time spent counseling pt on plan going forward.

## 2019-12-08 NOTE — Patient Instructions (Addendum)
You do report weight gain since Monday of about 20 lbs but different scale and you had minimal clothing at home. So want you to weigh yourself when you get home on same scale with same clothes.  We need to get stat labs and cxr to assess degree of chf. Will send your cardiologist note to opinion on modification to regimen. May need to make temporary modification to regimen if significant finding while we wait for cardiolgist response.  If severe sign/symptom change then ED evaluation.  Follow up date to be determined.

## 2019-12-09 ENCOUNTER — Other Ambulatory Visit: Payer: Self-pay | Admitting: Family

## 2019-12-09 DIAGNOSIS — D649 Anemia, unspecified: Secondary | ICD-10-CM

## 2019-12-09 DIAGNOSIS — E538 Deficiency of other specified B group vitamins: Secondary | ICD-10-CM

## 2019-12-09 NOTE — Telephone Encounter (Signed)
Notified Dr. Charlett Blake and pt cardiologist.

## 2019-12-12 ENCOUNTER — Encounter: Payer: Self-pay | Admitting: Family

## 2019-12-12 ENCOUNTER — Other Ambulatory Visit: Payer: Self-pay

## 2019-12-12 ENCOUNTER — Inpatient Hospital Stay: Payer: Medicare Other

## 2019-12-12 ENCOUNTER — Inpatient Hospital Stay: Payer: Medicare Other | Attending: Family | Admitting: Family

## 2019-12-12 ENCOUNTER — Telehealth: Payer: Self-pay | Admitting: *Deleted

## 2019-12-12 VITALS — BP 108/76 | HR 83 | Temp 98.0°F | Resp 18 | Ht 72.0 in | Wt 213.0 lb

## 2019-12-12 DIAGNOSIS — E039 Hypothyroidism, unspecified: Secondary | ICD-10-CM | POA: Insufficient documentation

## 2019-12-12 DIAGNOSIS — I130 Hypertensive heart and chronic kidney disease with heart failure and stage 1 through stage 4 chronic kidney disease, or unspecified chronic kidney disease: Secondary | ICD-10-CM | POA: Insufficient documentation

## 2019-12-12 DIAGNOSIS — K7030 Alcoholic cirrhosis of liver without ascites: Secondary | ICD-10-CM | POA: Diagnosis not present

## 2019-12-12 DIAGNOSIS — I2510 Atherosclerotic heart disease of native coronary artery without angina pectoris: Secondary | ICD-10-CM | POA: Diagnosis not present

## 2019-12-12 DIAGNOSIS — D500 Iron deficiency anemia secondary to blood loss (chronic): Secondary | ICD-10-CM | POA: Diagnosis not present

## 2019-12-12 DIAGNOSIS — R161 Splenomegaly, not elsewhere classified: Secondary | ICD-10-CM | POA: Insufficient documentation

## 2019-12-12 DIAGNOSIS — E611 Iron deficiency: Secondary | ICD-10-CM | POA: Insufficient documentation

## 2019-12-12 DIAGNOSIS — D649 Anemia, unspecified: Secondary | ICD-10-CM

## 2019-12-12 DIAGNOSIS — D61818 Other pancytopenia: Secondary | ICD-10-CM

## 2019-12-12 DIAGNOSIS — Z79899 Other long term (current) drug therapy: Secondary | ICD-10-CM | POA: Insufficient documentation

## 2019-12-12 DIAGNOSIS — F1010 Alcohol abuse, uncomplicated: Secondary | ICD-10-CM | POA: Insufficient documentation

## 2019-12-12 DIAGNOSIS — N183 Chronic kidney disease, stage 3 unspecified: Secondary | ICD-10-CM | POA: Diagnosis not present

## 2019-12-12 DIAGNOSIS — E538 Deficiency of other specified B group vitamins: Secondary | ICD-10-CM

## 2019-12-12 DIAGNOSIS — Z87891 Personal history of nicotine dependence: Secondary | ICD-10-CM | POA: Insufficient documentation

## 2019-12-12 LAB — CBC WITH DIFFERENTIAL (CANCER CENTER ONLY)
Abs Immature Granulocytes: 0 10*3/uL (ref 0.00–0.07)
Basophils Absolute: 0 10*3/uL (ref 0.0–0.1)
Basophils Relative: 0 %
Eosinophils Absolute: 0.1 10*3/uL (ref 0.0–0.5)
Eosinophils Relative: 2 %
HCT: 31.6 % — ABNORMAL LOW (ref 39.0–52.0)
Hemoglobin: 9.7 g/dL — ABNORMAL LOW (ref 13.0–17.0)
Immature Granulocytes: 0 %
Lymphocytes Relative: 27 %
Lymphs Abs: 0.7 10*3/uL (ref 0.7–4.0)
MCH: 32.3 pg (ref 26.0–34.0)
MCHC: 30.7 g/dL (ref 30.0–36.0)
MCV: 105.3 fL — ABNORMAL HIGH (ref 80.0–100.0)
Monocytes Absolute: 0.2 10*3/uL (ref 0.1–1.0)
Monocytes Relative: 10 %
Neutro Abs: 1.5 10*3/uL — ABNORMAL LOW (ref 1.7–7.7)
Neutrophils Relative %: 61 %
Platelet Count: 31 10*3/uL — ABNORMAL LOW (ref 150–400)
RBC: 3 MIL/uL — ABNORMAL LOW (ref 4.22–5.81)
RDW: 17.9 % — ABNORMAL HIGH (ref 11.5–15.5)
WBC Count: 2.4 10*3/uL — ABNORMAL LOW (ref 4.0–10.5)
nRBC: 0 % (ref 0.0–0.2)

## 2019-12-12 LAB — SAMPLE TO BLOOD BANK

## 2019-12-12 LAB — CMP (CANCER CENTER ONLY)
ALT: 8 U/L (ref 0–44)
AST: 34 U/L (ref 15–41)
Albumin: 3.5 g/dL (ref 3.5–5.0)
Alkaline Phosphatase: 465 U/L — ABNORMAL HIGH (ref 38–126)
Anion gap: 8 (ref 5–15)
BUN: 60 mg/dL — ABNORMAL HIGH (ref 8–23)
CO2: 27 mmol/L (ref 22–32)
Calcium: 9 mg/dL (ref 8.9–10.3)
Chloride: 103 mmol/L (ref 98–111)
Creatinine: 3.21 mg/dL (ref 0.61–1.24)
GFR, Est AFR Am: 22 mL/min — ABNORMAL LOW (ref >60)
GFR, Estimated: 19 mL/min — ABNORMAL LOW (ref >60)
Glucose, Bld: 114 mg/dL — ABNORMAL HIGH (ref 70–99)
Potassium: 4.7 mmol/L (ref 3.5–5.1)
Sodium: 138 mmol/L (ref 135–145)
Total Bilirubin: 1.8 mg/dL — ABNORMAL HIGH (ref 0.3–1.2)
Total Protein: 7.2 g/dL (ref 6.5–8.1)

## 2019-12-12 LAB — IRON AND TIBC
Iron: 30 ug/dL — ABNORMAL LOW (ref 42–163)
Saturation Ratios: 8 % — ABNORMAL LOW (ref 20–55)
TIBC: 363 ug/dL (ref 202–409)
UIBC: 332 ug/dL (ref 117–376)

## 2019-12-12 LAB — LACTATE DEHYDROGENASE: LDH: 434 U/L — ABNORMAL HIGH (ref 98–192)

## 2019-12-12 LAB — RETICULOCYTES
Immature Retic Fract: 23.3 % — ABNORMAL HIGH (ref 2.3–15.9)
RBC.: 2.97 MIL/uL — ABNORMAL LOW (ref 4.22–5.81)
Retic Count, Absolute: 70.4 10*3/uL (ref 19.0–186.0)
Retic Ct Pct: 2.4 % (ref 0.4–3.1)

## 2019-12-12 LAB — FERRITIN: Ferritin: 41 ng/mL (ref 24–336)

## 2019-12-12 LAB — SAVE SMEAR(SSMR), FOR PROVIDER SLIDE REVIEW

## 2019-12-12 LAB — PLATELET BY CITRATE

## 2019-12-12 LAB — VITAMIN B12: Vitamin B-12: 597 pg/mL (ref 180–914)

## 2019-12-12 NOTE — Progress Notes (Signed)
Hematology/Oncology Consultation   Name: Darren Mueller      MRN: [id number]    Location: Room/bed info not found  Date: 12/12/2019 Time:11:13 AM   REFERRING PHYSICIAN: Penni Homans, MD  REASON FOR CONSULT: Anemia unspecified   DIAGNOSIS: Pancytopenia  ETOH Cirrhosis with thrombocytopenia  Esophageal varices grade 1   HISTORY OF PRESENT ILLNESS: Darren Mueller is a very pleasant 69 yo gentleman with history of thrombocytosis for at least the last 8 years. He does have ETOH cirrhosis and over the last few years has developed pancytopenia. He has seen Dr. Alen Blew at [facility] in the past.  His most recent US (2020) showed cirrhosis and associated splenomegaly.  He has had some issues with epistaxis and hematochezia/melena in the past. He denies any recent bleeding, bruising or petechiae.   WBC is 2.4, Hgb 9.7 and platelets 31.  He sees GI Dr. Ardis Hughs and has seen Dr. Hilarie Fredrickson in the past. Endoscopy in April 2020 revealed a grade I, small (< 5 mm) varices in the lower third of the esophagus. He also had a small hiatal hernia and mild portal hypertensive gastropathy in the cardia, in the gastric fundus and in the gastric body. He had a colonoscopy in June 2020 with 3 benign polyps removed.  He does have a LINQ inserted cardiac monitor in place that is checked weekly. He is followed by cardiology Dr. Haroldine Laws.  He does experience fatigue at times.  He states that he has dizziness and diarrhea at times and states that these are side effects to some of the medications he takes.  No fever, chills, n/v, cough, rash, SOB, chest pain, palpitations, abdominal pain or changes in bladder habits.  No tenderness, numbness or tingling in his extremities.  He has history of CHF and has intermittent swelling in his feet and ankles. He is currently taking Demadex daily as well as a potassium supplement.  No personal history of cancer. Father had prostate cancer.  No family history of pancytopenia.  He quite smoking 35  years ago and does not use any recreational drugs.  He states that he is eating well and staying hydrated. His weight is stable.  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 We thanked him for his service to our country.   ROS: All other 10 point review of systems is negative.   PAST MEDICAL HISTORY:   Past Medical History:  Diagnosis Date  . Alcohol dependence ([place]) 03/22/2011   In remission   . Alcohol dependence in remission ([place]) 03/22/2011   In remission   . Alcoholic cirrhosis of liver with ascites ([place]) 2014  . Anginal pain ([place])    with SOB admitted 04/20/2019  . Cardiomegaly 02/2019   Noted on CXR  . CHF (congestive heart failure) ([place])   . CKD (chronic kidney disease), stage III 07/18/2013  . COPD (chronic obstructive pulmonary disease) ([place])   . Coronary artery disease   . Dermatitis 06/10/2015  . Diarrhea 09/04/2013  . Dyspnea    admitted 04/20/2019  . Elbow pain, left 03/17/2017  . Eustachian tube dysfunction   . GERD (gastroesophageal reflux disease)   . Gout   . Headache   . Hearing loss   . Heart murmur   . Hx of colonic polyps   . Hypertension   . Hypertriglyceridemia 03/17/2017  . Hypopotassemia   . Otalgia 11/28/2013  . Other chronic pulmonary heart diseases   . PAH (  pulmonary artery hypertension) ([facility])    RV failure  . Pancytopenia ([place])   . Pleural effusion 2014   Noted on CT:   Moderately large simple layering right pleural effusion   . Preventative health care 06/29/2016  . Pulmonary nodule 2014   Noted on CT: An 8 mm pulmonary nodule in the periphery of the left lower lobe  . Rectal bleeding   . Red eye 03/03/2016   right  . Seizures ([place])   . Sleep apnea    does not wear CPAP  . Splenomegaly 12/2015   Noted on CT  . Thoracic aorta atherosclerosis ([place]) 12/2017  . Thrombocytopenia ([place]) 06/29/2016  . Unspecified hypothyroidism 01/25/2013  . Unspecified pleural effusion   .  Vertigo   . Wears glasses     ALLERGIES: Allergies  Allergen Reactions  . Aspirin Hypertension  . Nitroglycerin Other (See Comments)    Headache      MEDICATIONS:  Current Outpatient Medications on File Prior to Visit  Medication Sig Dispense Refill  . acetaminophen (TYLENOL) 500 MG tablet Take 1,000 mg by mouth every 6 (six) hours as needed (for pain.).    Marland Kitchen ADVAIR HFA 230-21 MCG/ACT inhaler USE 2 INHALATIONS EVERY MORNING 48 g 5  . albuterol (VENTOLIN HFA) 108 (90 Base) MCG/ACT inhaler Inhale 2 puffs into the lungs every 6 (six) hours as needed for wheezing or shortness of breath.    . allopurinol (ZYLOPRIM) 100 MG tablet Take 2 tablets (200 mg total) by mouth daily. 180 tablet 1  . Blood Pressure Monitoring (BLOOD PRESSURE CUFF) MISC Use as directed. Check blood pressure bid prn Dx:I10 1 each 0  . colchicine 0.6 MG tablet Take 0.6 mg by mouth daily as needed (gout flare).    . Difluprednate (DUREZOL) 0.05 % EMUL Place 1 drop into the left eye daily.    . diphenoxylate-atropine (LOMOTIL) 2.5-0.025 MG tablet Take 1 tablet by mouth 3 (three) times daily. 60 tablet 0  . diphenoxylate-atropine (LOMOTIL) 2.5-0.025 MG tablet Take 1 tablet by mouth 4 (four) times daily as needed for diarrhea or loose stools. 40 tablet 1  . doxepin (SINEQUAN) 10 MG capsule Take 10-20 mg by mouth at bedtime.    . Ferrous Fumarate (HEMOCYTE) 324 (106 Fe) MG TABS tablet Take 1 tablet (106 mg of iron total) by mouth daily. 30 tablet 5  . folic acid (FOLVITE) 1 MG tablet Take 1 tablet (1 mg total) by mouth daily. 30 tablet 5  . hydrOXYzine (ATARAX/VISTARIL) 25 MG tablet Take 1 tablet (25 mg total) by mouth 3 (three) times daily as needed. 30 tablet 0  . hyoscyamine (LEVSIN SL) 0.125 MG SL tablet Place 1 tablet (0.125 mg total) under the tongue every 6 (six) hours as needed. 30 tablet 1  . macitentan (OPSUMIT) 10 MG tablet Take 1 tablet (10 mg total) by mouth daily. 90 tablet 3  . Magnesium Oxide (MAG-OXIDE) 200 MG  TABS Take 2 tablets (400 mg total) by mouth daily. 180 tablet 3  . mometasone-formoterol (DULERA) 100-5 MCG/ACT AERO Inhale 2 puffs into the lungs 2 (two) times daily.    . Multiple Vitamin (MULTIVITAMIN) tablet Take 1 tablet by mouth daily.    . mupirocin ointment (BACTROBAN) 2 % Place 1 application into the nose at bedtime. 22 g 0  . omeprazole (PRILOSEC) 40 MG capsule TAKE 1 CAPSULE BY MOUTH ONCE DAILY 90 capsule 0  . Potassium Chloride ER 20 MEQ TBCR Take 80 mEq by mouth every morning AND 60  mEq every evening. 210 tablet 5  . rosuvastatin (CRESTOR) 5 MG tablet TAKE 1 TABLET DAILY 90 tablet 3  . Selexipag (UPTRAVI) 1000 MCG TABS Take 1,000 mcg by mouth 2 (two) times daily. 60 tablet 11  . tadalafil, PAH, (ADCIRCA) 20 MG tablet Take 2 tablets (40 mg total) by mouth daily. 180 tablet 1  . thiamine 100 MG tablet Take 1 tablet (100 mg total) by mouth daily. 30 tablet 5  . torsemide (DEMADEX) 20 MG tablet Take 3 tablets (60 mg total) by mouth 2 (two) times daily. 180 tablet 5   No current facility-administered medications on file prior to visit.     PAST SURGICAL HISTORY Past Surgical History:  Procedure Laterality Date  . CARDIAC CATHETERIZATION N/A 12/21/2015   Procedure: Right Heart Cath;  Surgeon: Jolaine Artist, MD;  Location: [facility];  Service: Cardiovascular;  Laterality: N/A;  . CARDIAC CATHETERIZATION N/A 04/15/2016   Procedure: Right/Left Heart Cath and Coronary Angiography;  Surgeon: Jolaine Artist, MD;  Location: [facility];  Service: Cardiovascular;  Laterality: N/A;  . COLONOSCOPY N/A 12/08/2013   Procedure: COLONOSCOPY;  Surgeon: Milus Banister, MD;  Location: [facility];  Service: Endoscopy;  Laterality: N/A;  . COLONOSCOPY WITH PROPOFOL N/A 03/24/2019   Procedure: COLONOSCOPY WITH PROPOFOL;  Surgeon: Milus Banister, MD;  Location: [facility];  Service: Endoscopy;  Laterality: N/A;  Draw CBC in preop  . COLONOSCOPY WITH PROPOFOL N/A 04/28/2019    Procedure: COLONOSCOPY WITH PROPOFOL;  Surgeon: Milus Banister, MD;  Location: [facility];  Service: Endoscopy;  Laterality: N/A;  . ESOPHAGOGASTRODUODENOSCOPY (EGD) WITH PROPOFOL N/A 03/27/2016   Procedure: ESOPHAGOGASTRODUODENOSCOPY (EGD) WITH PROPOFOL;  Surgeon: Milus Banister, MD;  Location: [facility];  Service: Endoscopy;  Laterality: N/A;  . ESOPHAGOGASTRODUODENOSCOPY (EGD) WITH PROPOFOL N/A 02/14/2019   Procedure: ESOPHAGOGASTRODUODENOSCOPY (EGD) WITH PROPOFOL;  Surgeon: Jerene Bears, MD;  Location[facility];  Service: Gastroenterology;  Laterality: N/A;  . EYE SURGERY Bilateral 03/15/2019   lasix  . GAS/FLUID EXCHANGE Right 09/22/2019   Procedure: GAS/FLUID EXCHANGE (C3F8) RIGHT EYE;  Surgeon: Sherlynn Stalls, MD;  Location: [facility];  Service: Ophthalmology;  Laterality: Right;  . HEMOSTASIS CLIP PLACEMENT  04/28/2019   Procedure: HEMOSTASIS CLIP PLACEMENT;  Surgeon: Milus Banister, MD;  Location: [facility];  Service: Endoscopy;;  . LOOP RECORDER INSERTION N/A 01/01/2017   Procedure: Loop Recorder Insertion;  Surgeon: Thompson Grayer, MD;  Location: [facility];  Service: Cardiovascular;  Laterality: N/A;  . PARS PLANA VITRECTOMY Right 09/22/2019   Procedure: PARS PLANA VITRECTOMY WITH 25 GAUGE RIGHT EYE;  Surgeon: Sherlynn Stalls, MD;  Location: [facility];  Service: Ophthalmology;  Laterality: Right;  . PHOTOCOAGULATION WITH LASER Right 09/22/2019   Procedure: PHOTOCOAGULATION WITH LASER RIGHT EYE;  Surgeon: Sherlynn Stalls, MD;  Location: [facility];  Service: Ophthalmology;  Laterality: Right;  . POLYPECTOMY  04/28/2019   Procedure: POLYPECTOMY;  Surgeon: Milus Banister, MD;  Location: [facility];  Service: Endoscopy;;  . RIGHT HEART CATHETERIZATION Right 06/06/2014   Procedure: RIGHT HEART CATH;  Surgeon: Jolaine Artist, MD;  Location: [facility] CATH LAB;  Service: Cardiovascular;  Laterality: Right;  . UVULOPALATOPHARYNGOPLASTY  1999    FAMILY HISTORY: Family History  Problem  Relation Age of Onset  . Heart disease Mother   . Heart attack Mother   . Hypertension Mother   . Kidney failure Mother   . Liver disease Mother  stage 4 liver disease  . Prostate cancer Father   . Cancer Father        lung  . Hypertension Brother   . Gout Brother   . Alcoholism Maternal Grandfather   . Liver disease Maternal Grandfather   . Migraines Sister   . Hypertension Brother   . Colon cancer Neg Hx     SOCIAL HISTORY:  reports that he quit smoking about 40 years ago. His smoking use included cigarettes. He has a 10.00 pack-year smoking history. He has never used smokeless tobacco. He reports current alcohol use of about 6.0 standard drinks of alcohol per week. He reports that he does not use drugs.  PERFORMANCE STATUS: The patient's performance status is 1 - Symptomatic but completely ambulatory  PHYSICAL EXAM: Most Recent Vital Signs: There were no vitals taken for this visit. There were no vitals taken for this visit.  General Appearance:    Alert, cooperative, no distress, appears stated age  Head:    Normocephalic, without obvious abnormality, atraumatic  Eyes:    PERRL, conjunctiva/corneas clear, EOM's intact, fundi    benign, both eyes             Throat:   Lips, mucosa, and tongue normal; teeth and gums normal  Neck:   Supple, symmetrical, trachea midline, no adenopathy;       thyroid:  No enlargement/tenderness/nodules; no carotid   bruit or JVD  Back:     Symmetric, no curvature, ROM normal, no CVA tenderness  Lungs:     Clear to auscultation bilaterally, respirations unlabored  Chest wall:    No tenderness or deformity  Heart:    Regular rate and rhythm, S1 and S2 normal, no murmur, rub   or gallop  Abdomen:     Soft, non-tender, bowel sounds active all four quadrants,    no masses, no organomegaly        Extremities:   Extremities normal, atraumatic, no cyanosis or edema  Pulses:   2+ and symmetric all extremities  Skin:   Skin color, texture,  turgor normal, no rashes or lesions  Lymph nodes:   Cervical, supraclavicular, and axillary nodes normal  Neurologic:   CNII-XII intact. Normal strength, sensation and reflexes      throughout    LABORATORY DATA:  No results found for this or any previous visit (from the past 48 hour(s)).    RADIOGRAPHY: No results found.     PATHOLOGY: None  ASSESSMENT/PLAN: Mr. Beneke is a very pleasant 69 yo gentleman with history of thrombocytosis for at least the last 8 years. He does have ETOH cirrhosis and over the last few years has developed pancytopenia. He also has history of splenomegaly. His counts are down but have remained stable over the last 6 months. He is asymptomatic at this time.  We will continue to follow along with him and plan to see him back in another 6 months.    All questions were answered and he is in agreement with the plan. He will contact our office with any questions or concerns. We can certainly see him sooner if needed.   He was discussed with and also seen by Dr. Marin Olp and he is in agreement with the aforementioned.   Laverna Peace, NP    Addendum: I saw and examined Mr. Lemoine.  I agree with the above note by Judson Roch.  He is a wonderful man to talk to.  He served 25 years in the [employer].  He  was a attended Designer, multimedia.  Unfortunately, I think his problem is alcohol use.  I think that the alcohol has clearly led to his cirrhosis.  He has a little bit of splenomegaly which I am sure is causing some sequestration this is blood.  I am also sure that the alcohol is probably a toxin to the bone marrow.  I believe that his blood smear under the microscope.  I do not see anything that looked suspicious.  He had good maturation of his red blood cells.  I do not see any nucleated red blood cells.  He had no rouleaux formation.  His white blood cells were decreased a little.  He had no hypersegmented polys.  He had no immature myeloid or lymphoid forms.  Platelets were  decreased in number.  Platelets were well granulated.  He had a few large platelets.  I do not see that we have to do a bone marrow test on him.  I would not imagine that this is a hematologic malignancy that we are dealing with.  He has had some element of pancytopenia for a few years.  His blood counts really have not changed.  I try to talk to him about not drinking.  I warned him that drinking clearly will continue to poison his bone marrow his blood counts will drop further and he may be transfusion dependent.  He is iron deficient.  I am sure that he has bleeding from the varices.  His vitamin B-12 level is okay at 600.   For right now, I would not give him IV iron.  I think this might worsen his platelet count.  We will have to follow him up.  We will plan to see him back in another 6 months.  We could certainly see him sooner if he has any problems.  Again he is very fascinating to talk to.  We spent about 45 minutes with him.  Lattie Haw, MD

## 2019-12-12 NOTE — Telephone Encounter (Signed)
Critical Cr reported as 3.2.  Dr. Marin Olp notified.  No orders received

## 2019-12-13 LAB — HEMOGLOBINOPATHY EVALUATION
Hgb A2 Quant: 2.3 % (ref 1.8–3.2)
Hgb A: 97.7 % (ref 96.4–98.8)
Hgb C: 0 %
Hgb F Quant: 0 % (ref 0.0–2.0)
Hgb S Quant: 0 %
Hgb Variant: 0 %

## 2019-12-13 LAB — ERYTHROPOIETIN: Erythropoietin: 208 m[IU]/mL — ABNORMAL HIGH (ref 2.6–18.5)

## 2019-12-15 MED FILL — OSCIMIN SL 0.125 MG TABLET: 0.125 | 8 days supply | Qty: 30 | Fill #1

## 2019-12-15 MED FILL — DOXEPIN 25 MG CAPSULE: 25 | 30 days supply | Qty: 60 | Fill #1

## 2020-01-02 ENCOUNTER — Ambulatory Visit (INDEPENDENT_AMBULATORY_CARE_PROVIDER_SITE_OTHER): Payer: Medicare Other | Admitting: *Deleted

## 2020-01-02 DIAGNOSIS — R55 Syncope and collapse: Secondary | ICD-10-CM | POA: Diagnosis not present

## 2020-01-02 LAB — CUP PACEART REMOTE DEVICE CHECK
Date Time Interrogation Session: 20210228234427
Implantable Pulse Generator Implant Date: 20180301

## 2020-01-02 NOTE — Progress Notes (Signed)
ILR Remote 

## 2020-01-06 ENCOUNTER — Ambulatory Visit (HOSPITAL_COMMUNITY)
Admission: RE | Admit: 2020-01-06 | Discharge: 2020-01-06 | Disposition: A | Discharge status: Home / Self Care | Payer: Medicare Other | Source: Ambulatory Visit | Attending: Internal Medicine | Admitting: Internal Medicine

## 2020-01-06 ENCOUNTER — Other Ambulatory Visit: Payer: Self-pay

## 2020-01-06 DIAGNOSIS — Z5329 Procedure and treatment not carried out because of patient's decision for other reasons: Secondary | ICD-10-CM

## 2020-01-06 NOTE — Progress Notes (Signed)
Patient was a no-show

## 2020-01-09 ENCOUNTER — Telehealth: Payer: Self-pay | Admitting: Family Medicine

## 2020-01-09 NOTE — Telephone Encounter (Signed)
Patient states coughing and excessive weight gain in the last week. Please advise patient

## 2020-01-09 NOTE — Telephone Encounter (Signed)
Patient scheduled for tomorrow with Percell Miller, virtual visit.

## 2020-01-10 ENCOUNTER — Ambulatory Visit: Payer: Medicare Other | Admitting: Medical

## 2020-01-10 ENCOUNTER — Other Ambulatory Visit: Payer: Self-pay

## 2020-01-11 ENCOUNTER — Ambulatory Visit (HOSPITAL_BASED_OUTPATIENT_CLINIC_OR_DEPARTMENT_OTHER)
Admission: RE | Admit: 2020-01-11 | Discharge: 2020-01-11 | Disposition: A | Discharge status: Home / Self Care | Payer: Medicare Other | Source: Ambulatory Visit | Attending: Medical | Admitting: Medical

## 2020-01-11 ENCOUNTER — Other Ambulatory Visit: Payer: Self-pay

## 2020-01-11 ENCOUNTER — Ambulatory Visit (INDEPENDENT_AMBULATORY_CARE_PROVIDER_SITE_OTHER): Payer: Medicare Other | Admitting: Medical

## 2020-01-11 ENCOUNTER — Telehealth: Payer: Self-pay | Admitting: Emergency Medicine

## 2020-01-11 ENCOUNTER — Telehealth: Payer: Self-pay | Admitting: Medical

## 2020-01-11 VITALS — BP 112/60 | HR 83 | Temp 97.8°F | Resp 18 | Ht 72.0 in | Wt 219.4 lb

## 2020-01-11 DIAGNOSIS — R600 Localized edema: Secondary | ICD-10-CM | POA: Insufficient documentation

## 2020-01-11 DIAGNOSIS — I509 Heart failure, unspecified: Secondary | ICD-10-CM

## 2020-01-11 DIAGNOSIS — D61818 Other pancytopenia: Secondary | ICD-10-CM | POA: Diagnosis not present

## 2020-01-11 DIAGNOSIS — N183 Chronic kidney disease, stage 3 unspecified: Secondary | ICD-10-CM

## 2020-01-11 DIAGNOSIS — R0600 Dyspnea, unspecified: Secondary | ICD-10-CM

## 2020-01-11 DIAGNOSIS — R05 Cough: Secondary | ICD-10-CM | POA: Diagnosis not present

## 2020-01-11 DIAGNOSIS — R0602 Shortness of breath: Secondary | ICD-10-CM | POA: Diagnosis not present

## 2020-01-11 LAB — CBC WITH DIFFERENTIAL/PLATELET
Basophils Absolute: 0 10*3/uL (ref 0.0–0.1)
Basophils Relative: 0.2 % (ref 0.0–3.0)
Eosinophils Absolute: 0 10*3/uL (ref 0.0–0.7)
Eosinophils Relative: 1.8 % (ref 0.0–5.0)
HCT: 27.9 % — ABNORMAL LOW (ref 39.0–52.0)
Hemoglobin: 9 g/dL — ABNORMAL LOW (ref 13.0–17.0)
Lymphocytes Relative: 24.4 % (ref 12.0–46.0)
Lymphs Abs: 0.4 10*3/uL — ABNORMAL LOW (ref 0.7–4.0)
MCHC: 32.3 g/dL (ref 30.0–36.0)
MCV: 102.6 fl — ABNORMAL HIGH (ref 78.0–100.0)
Monocytes Absolute: 0.2 10*3/uL (ref 0.1–1.0)
Monocytes Relative: 14.8 % — ABNORMAL HIGH (ref 3.0–12.0)
Neutro Abs: 0.9 10*3/uL — ABNORMAL LOW (ref 1.4–7.7)
Neutrophils Relative %: 58.8 % (ref 43.0–77.0)
Platelets: 21 10*3/uL — CL (ref 150.0–400.0)
RBC: 2.72 Mil/uL — ABNORMAL LOW (ref 4.22–5.81)
RDW: 17.9 % — ABNORMAL HIGH (ref 11.5–15.5)
WBC: 1.5 10*3/uL — CL (ref 4.0–10.5)

## 2020-01-11 LAB — COMPREHENSIVE METABOLIC PANEL
ALT: 9 U/L (ref 0–53)
AST: 38 U/L — ABNORMAL HIGH (ref 0–37)
Albumin: 3 g/dL — ABNORMAL LOW (ref 3.5–5.2)
Alkaline Phosphatase: 450 U/L — ABNORMAL HIGH (ref 39–117)
BUN: 48 mg/dL — ABNORMAL HIGH (ref 6–23)
CO2: 25 mEq/L (ref 19–32)
Calcium: 8.3 mg/dL — ABNORMAL LOW (ref 8.4–10.5)
Chloride: 107 mEq/L (ref 96–112)
Creatinine, Ser: 2.13 mg/dL — ABNORMAL HIGH (ref 0.40–1.50)
GFR: 37.36 mL/min — ABNORMAL LOW (ref >60.00)
Glucose, Bld: 106 mg/dL — ABNORMAL HIGH (ref 70–99)
Potassium: 3.6 mEq/L (ref 3.5–5.1)
Sodium: 135 mEq/L (ref 135–145)
Total Bilirubin: 2.3 mg/dL — ABNORMAL HIGH (ref 0.2–1.2)
Total Protein: 6.9 g/dL (ref 6.0–8.3)

## 2020-01-11 LAB — BRAIN NATRIURETIC PEPTIDE: Pro B Natriuretic peptide (BNP): 494 pg/mL — ABNORMAL HIGH (ref 0.0–100.0)

## 2020-01-11 MED ORDER — FOLIC ACID 1 MG PO TABS: 1.0000 mg | ORAL_TABLET | Freq: Every day | ORAL | 5 refills | Status: DC

## 2020-01-11 MED ORDER — TRAZODONE HCL 50 MG PO TABS: 25.0000 mg | ORAL_TABLET | Freq: Every evening | ORAL | 0 refills | Status: DC | PRN

## 2020-01-11 MED FILL — FOLIC ACID 1 MG TABS: 1 | 30 days supply | Qty: 30 | Fill #0

## 2020-01-11 MED FILL — traZODone HCL 50 MG TABS: 50 | 30 days supply | Qty: 30 | Fill #0

## 2020-01-11 NOTE — Telephone Encounter (Signed)
Future cmp placed.

## 2020-01-11 NOTE — Telephone Encounter (Signed)
"  CRITICAL VALUE STICKER  CRITICAL VALUE:White count 1.5 Plt Count 21,000  RECEIVER (on-site recipient of call):Kristy  DATE & TIME NOTIFIED: 1630  MESSENGER (representative from lab):Karen  MD NOTIFIED: [place]  RESPONSE:

## 2020-01-11 NOTE — Addendum Note (Signed)
Addended by: Anabel Halon on: 01/11/2020 02:34 PM   Modules accepted: Orders

## 2020-01-11 NOTE — Progress Notes (Signed)
Pt scheduled for tomorrow. Stable on review. But by hx of chf need in office.

## 2020-01-11 NOTE — Progress Notes (Signed)
Subjective:    Patient ID: Darren Mueller, male    DOB: 08-Jan-1951, 69 y.o.   MRN: [id number]  HPI  Pt in for recent dry cough and lower ext edema. He states he gained 10 lb over past month. On review pt flow sheet does not show 10 lb wt gain. But states at home his wt 218 lb. Pt states cardiologist wants his wt to be 190 lbs.  In epic I see note that says incomplete.   ASSESSMENT AND PLAN:  1. PAH: Suspected portopulmonary HTN in setting of ETOH cirrhosis. - Remains NYHA III now with worsening R-sided HF and volume overload in setting of ongoing inability to manage his own diuretic regimen -6MW 9/19 1280 feet (390 meters) - Echo 9/19 with EF 60-65% RV markedly dilated and hypokinetic with D-shaped septum - Echo 05/17/19: LVEF >65% RV severely dilated with moderate RV dysfunction. Septal flattening Moderate TR RVSP 81mHG. - Continue selexipag 1000 mcg twice a day. Intolerant higher dose due to diarrhea and headaches . - Continue macitentan 10 daily and adcirca 40 mg daily. - With RV strain on echo may need to considerrepeat RHC andIV therapies in the near future but doubt he will be candidate for this  2. Acute on chronic diastolic HF - Continues to struggle with volume overload and cardiorenal syndrome (baseline weight likely closer to 190-195). He is 203 but further diuresis limited by AKI - Now back on torsemide 60 bid. Weight improved. K low at 3.4 today. Told him to increase kdur to 4 tabs (852m) in am and 3 (6037m in pm.   3. Headaches and epistaxis - HAs could definitely come from selexipag but would be unusual to get acutely worse without changing dose. - he has chronic pancytopenia due to liver disease (followed by Dr. ShaAlen Blew await results of head CT  4. Acute CKD III:  -Baseline creatinine 1.7-2.0.  - Creatinine 1.77 today - Followed by Dr. ColMarval Regal Change diuretics as above. Repeat labs this week  5. Hypokalemia - K 3.4 today. Increse  potassium as above  5. Cirrhosis: - Likely combination of RV failure and ETOH - Follows with Dr. JacArdis Hughs Did not tolerate nadolol.  - EGD4/20 stable - Stressed need for ETOH cessation  7. Recurrent syncope/seizure - LINQ interrogations have shown no arrhythmias and thus most likely seizure - Continue Keppra. Follows with Dr. AquDelice Lesch. OSA:  - Has mild OSA with AHI 12 and desats down to 82%.  - Intolerant CPAP.  9. Pancytopenia:  - Has seen Dr. ShaAlen Blew Hematology - felt like it is due to splenic sequestration.  -No role for BMBx current per Dr. ShaAlen BlewCounts have not changed appreciably but are very low. Agree with referral back to Dr. ShaAlen Blew 10. Hypomag - continue magOx 400 daily  COVID screen The patient does not have any symptoms that suggest any further testing/ screening at this time. Social distancing reinforced today.  Patient Risk: After full review of this patients clinical status, I feel that they are at moderate risk for cardiac decompensation at this time.  Relevant cardiac medications were reviewed at length with the patient today. The patient does not have concerns regarding their medications at this time.   Recommended follow-up:1 month televisit  Pt does not remember visit with Dr. BenPhillip Heal 01/06/2020.   States last 2 weeks more sob. Coughing more at night.     Review of Systems  Respiratory: Positive for cough  and shortness of breath. Negative for chest tightness and wheezing.   Cardiovascular: Negative for chest pain and palpitations.  Gastrointestinal: Negative for abdominal pain.  Genitourinary: Negative for decreased urine volume, difficulty urinating, dysuria, penile swelling and testicular pain.  Musculoskeletal: Negative for back pain and neck pain.       Pedal edema.  Skin: Negative for rash.  Neurological: Negative for dizziness, weakness and headaches.  Hematological: Negative for adenopathy. Does not bruise/bleed  easily.  Psychiatric/Behavioral: Negative for behavioral problems and confusion.   Past Medical History:  Diagnosis Date  . Alcohol dependence ([place]) 03/22/2011   In remission   . Alcohol dependence in remission ([place]) 03/22/2011   In remission   . Alcoholic cirrhosis of liver with ascites ([place]) 2014  . Anginal pain ([place])    with SOB admitted 04/20/2019  . Cardiomegaly 02/2019   Noted on CXR  . CHF (congestive heart failure) ([place])   . CKD (chronic kidney disease), stage III 07/18/2013  . COPD (chronic obstructive pulmonary disease) ([place])   . Coronary artery disease   . Dermatitis 06/10/2015  . Diarrhea 09/04/2013  . Dyspnea    admitted 04/20/2019  . Elbow pain, left 03/17/2017  . Eustachian tube dysfunction   . GERD (gastroesophageal reflux disease)   . Gout   . Headache   . Hearing loss   . Heart murmur   . Hx of colonic polyps   . Hypertension   . Hypertriglyceridemia 03/17/2017  . Hypopotassemia   . Otalgia 11/28/2013  . Other chronic pulmonary heart diseases   . PAH (pulmonary artery hypertension) ([facility])    RV failure  . Pancytopenia ([place])   . Pleural effusion 2014   Noted on CT:   Moderately large simple layering right pleural effusion   . Preventative health care 06/29/2016  . Pulmonary nodule 2014   Noted on CT: An 8 mm pulmonary nodule in the periphery of the left lower lobe  . Rectal bleeding   . Red eye 03/03/2016   right  . Seizures ([place])   . Sleep apnea    does not wear CPAP  . Splenomegaly 12/2015   Noted on CT  . Thoracic aorta atherosclerosis ([place]) 12/2017  . Thrombocytopenia ([place]) 06/29/2016  . Unspecified hypothyroidism 01/25/2013  . Unspecified pleural effusion   . Vertigo   . Wears glasses      Social History   Socioeconomic History  . Marital status: Divorced    Spouse name: Not on file  . Number of children: 3  . Years of education: Not on file  . Highest education level: Not on file  Occupational History  . Occupation: Retired    Comment: Geologist, engineering  Tobacco Use  . Smoking status: Former Smoker    Packs/day: 2.00    Years: 5.00    Pack years: 10.00    Types: Cigarettes    Quit date: 09/22/1979    Years since quitting: 40.3  . Smokeless tobacco: Never Used  Substance and Sexual Activity  . Alcohol use: Yes    Alcohol/week: 6.0 standard drinks    Types: 6 Shots of liquor per week    Comment: social drinker  . Drug use: No  . Sexual activity: Yes    Birth control/protection: Spermicide  Other Topics Concern  . Not on file  Social History Narrative   0 caffeine drinks daily    Social Determinants of Health   Financial Resource Strain: Low Risk   . Difficulty of Paying Living  Expenses: Not hard at all  Food Insecurity: No Food Insecurity  . Worried About Charity fundraiser in the Last Year: Never true  . Ran Out of Food in the Last Year: Never true  Transportation Needs: No Transportation Needs  . Lack of Transportation (Medical): No  . Lack of Transportation (Non-Medical): No  Physical Activity: Inactive  . Days of Exercise per Week: 0 days  . Minutes of Exercise per Session: 0 min  Stress: Stress Concern Present  . Feeling of Stress : Rather much  Social Connections: Slightly Isolated  . Frequency of Communication with Friends and Family: More than three times a week  . Frequency of Social Gatherings with Friends and Family: Three times a week  . Attends Religious Services: More than 4 times per year  . Active Member of Clubs or Organizations: Yes  . Attends Archivist Meetings: More than 4 times per year  . Marital Status: Divorced  Human resources officer Violence:   . Fear of Current or Ex-Partner: Not on file  . Emotionally Abused: Not on file  . Physically Abused: Not on file  . Sexually Abused: Not on file    Past Surgical History:  Procedure Laterality Date  . CARDIAC CATHETERIZATION N/A 12/21/2015   Procedure: Right Heart Cath;  Surgeon: Jolaine Artist, MD;  Location: [facility];   Service: Cardiovascular;  Laterality: N/A;  . CARDIAC CATHETERIZATION N/A 04/15/2016   Procedure: Right/Left Heart Cath and Coronary Angiography;  Surgeon: Jolaine Artist, MD;  Location: [facility];  Service: Cardiovascular;  Laterality: N/A;  . COLONOSCOPY N/A 12/08/2013   Procedure: COLONOSCOPY;  Surgeon: Milus Banister, MD;  Location: [facility];  Service: Endoscopy;  Laterality: N/A;  . COLONOSCOPY WITH PROPOFOL N/A 03/24/2019   Procedure: COLONOSCOPY WITH PROPOFOL;  Surgeon: Milus Banister, MD;  Location: [facility];  Service: Endoscopy;  Laterality: N/A;  Draw CBC in preop  . COLONOSCOPY WITH PROPOFOL N/A 04/28/2019   Procedure: COLONOSCOPY WITH PROPOFOL;  Surgeon: Milus Banister, MD;  Location: [facility];  Service: Endoscopy;  Laterality: N/A;  . ESOPHAGOGASTRODUODENOSCOPY (EGD) WITH PROPOFOL N/A 03/27/2016   Procedure: ESOPHAGOGASTRODUODENOSCOPY (EGD) WITH PROPOFOL;  Surgeon: Milus Banister, MD;  Location: [facility];  Service: Endoscopy;  Laterality: N/A;  . ESOPHAGOGASTRODUODENOSCOPY (EGD) WITH PROPOFOL N/A 02/14/2019   Procedure: ESOPHAGOGASTRODUODENOSCOPY (EGD) WITH PROPOFOL;  Surgeon: Jerene Bears, MD;  Location[facility] END[facility];  Service: Gastroenterology;  Laterality: N/A;  . EYE SURGERY Bilateral 03/15/2019   lasix  . GAS/FLUID EXCHANGE Right 09/22/2019   Procedure: GAS/FLUID EXCHANGE (C3F8) RIGHT EYE;  Surgeon: Sherlynn Stalls, MD;  Location: [facility];  Service: Ophthalmology;  Laterality: Right;  . HEMOSTASIS CLIP PLACEMENT  04/28/2019   Procedure: HEMOSTASIS CLIP PLACEMENT;  Surgeon: Milus Banister, MD;  Location: [facility];  Service: Endoscopy;;  . LOOP RECORDER INSERTION N/A 01/01/2017   Procedure: Loop Recorder Insertion;  Surgeon: Thompson Grayer, MD;  Location: [facility];  Service: Cardiovascular;  Laterality: N/A;  . PARS PLANA VITRECTOMY Right 09/22/2019   Procedure: PARS PLANA VITRECTOMY WITH 25 GAUGE RIGHT EYE;  Surgeon: Sherlynn Stalls, MD;   Location: [place] [facility];  Service: Ophthalmology;  Laterality: Right;  . PHOTOCOAGULATION WITH LASER Right 09/22/2019   Procedure: PHOTOCOAGULATION WITH LASER RIGHT EYE;  Surgeon: Sherlynn Stalls, MD;  Location: [facility];  Service: Ophthalmology;  Laterality: Right;  . POLYPECTOMY  04/28/2019   Procedure: POLYPECTOMY;  Surgeon: Milus Banister, MD;  Location: [facility];  Service: Endoscopy;;  . RIGHT HEART CATHETERIZATION Right 06/06/2014   Procedure: RIGHT HEART CATH;  Surgeon: Jolaine Artist, MD;  Location: [facility] C[facility] LAB;  Service: Cardiovascular;  Laterality: Right;  . UVULOPALATOPHARYNGOPLASTY  1999    Family History  Problem Relation Age of Onset  . Heart disease Mother   . Heart attack Mother   . Hypertension Mother   . Kidney failure Mother   . Liver disease Mother        stage 4 liver disease  . Prostate cancer Father   . Cancer Father        lung  . Hypertension Brother   . Gout Brother   . Alcoholism Maternal Grandfather   . Liver disease Maternal Grandfather   . Migraines Sister   . Hypertension Brother   . Colon cancer Neg Hx     Allergies  Allergen Reactions  . Aspirin Hypertension  . Nitroglycerin Other (See Comments)    Severe Headache    Current Outpatient Medications on File Prior to Visit  Medication Sig Dispense Refill  . acetaminophen (TYLENOL) 500 MG tablet Take 1,000 mg by mouth every 6 (six) hours as needed (for pain.).    Marland Kitchen ADVAIR HFA 230-21 MCG/ACT inhaler USE 2 INHALATIONS EVERY MORNING 48 g 5  . allopurinol (ZYLOPRIM) 100 MG tablet Take 2 tablets (200 mg total) by mouth daily. 180 tablet 1  . Blood Pressure Monitoring (BLOOD PRESSURE CUFF) MISC Use as directed. Check blood pressure bid prn Dx:I10 1 each 0  . diphenoxylate-atropine (LOMOTIL) 2.5-0.025 MG tablet Take 1 tablet by mouth 4 (four) times daily as needed for diarrhea or loose stools. 40 tablet 1  . doxepin (SINEQUAN) 10 MG capsule Take 10-20 mg by mouth at bedtime.    . Ferrous Fumarate  (HEMOCYTE) 324 (106 Fe) MG TABS tablet Take 1 tablet (106 mg of iron total) by mouth daily. 30 tablet 5  . folic acid (FOLVITE) 1 MG tablet Take 1 tablet (1 mg total) by mouth daily. 30 tablet 5  . hydrOXYzine (ATARAX/VISTARIL) 25 MG tablet Take 1 tablet (25 mg total) by mouth 3 (three) times daily as needed. 30 tablet 0  . hyoscyamine (LEVSIN SL) 0.125 MG SL tablet Place 1 tablet (0.125 mg total) under the tongue every 6 (six) hours as needed. 30 tablet 1  . macitentan (OPSUMIT) 10 MG tablet Take 1 tablet (10 mg total) by mouth daily. 90 tablet 3  . mometasone-formoterol (DULERA) 100-5 MCG/ACT AERO Inhale 2 puffs into the lungs 2 (two) times daily.    . Multiple Vitamin (MULTIVITAMIN) tablet Take 1 tablet by mouth daily.    . mupirocin ointment (BACTROBAN) 2 % Place 1 application into the nose at bedtime. 22 g 0  . omeprazole (PRILOSEC) 40 MG capsule TAKE 1 CAPSULE BY MOUTH ONCE DAILY 90 capsule 0  . Potassium Chloride ER 20 MEQ TBCR Take 80 mEq by mouth every morning AND 60 mEq every evening. 210 tablet 5  . rosuvastatin (CRESTOR) 5 MG tablet TAKE 1 TABLET DAILY 90 tablet 3  . Selexipag (UPTRAVI) 1000 MCG TABS Take 1,000 mcg by mouth 2 (two) times daily. 60 tablet 11  . tadalafil, PAH, (ADCIRCA) 20 MG tablet Take 2 tablets (40 mg total) by mouth daily. 180 tablet 1  . thiamine 100 MG tablet Take 1 tablet (100 mg total) by mouth daily. 30 tablet 5  . torsemide (DEMADEX) 20 MG tablet Take 3 tablets (60 mg total) by mouth 2 (two) times  daily. 180 tablet 5   No current facility-administered medications on file prior to visit.    BP 112/60 (BP Location: Left Arm, Patient Position: Sitting, Cuff Size: Large)   Pulse 83   Temp 97.8 F (36.6 C) (Temporal)   Resp 18   Ht 6' (1.829 m)   Wt 219 lb 6.4 oz (99.5 kg)   SpO2 97%   BMI 29.76 kg/m       Objective:   Physical Exam  General Mental Status- Alert. General Appearance- Not in acute distress.   Skin General: Color- Normal Color.  Moisture- Normal Moisture.  Neck Carotid Arteries- Normal color. Moisture- Normal Moisture. No carotid bruits. No JVD.  Chest and Lung Exam Auscultation: Breath Sounds:-Normal.  Cardiovascular Auscultation:Rythm- Regular. Murmurs & Other Heart Sounds:Auscultation of the heart reveals- No Murmurs.  Abdomen Inspection:-Inspeection Normal. Palpation/Percussion:Note:No mass. Palpation and Percussion of the abdomen reveal- Non Tender, Non Distended + BS, no rebound or guarding.    Neurologic Cranial Nerve exam:- CN III-XII intact(Nonystagmus), symmetric smile.  Strengh:- 5/5 equal and symmetric strength both upper and lower extremities.  Lower ext- moderate to severe symmetric lower ext edema. 2-3+. No warmth. On palpation. Negative homans signs.      Assessment & Plan:  [phone]  For history of chf, panctopenia, cough and dyspnea will get cxr, bnp, cmp and cbc. Stat test today. Will adjust medication if needed.  On 01/06/2020 their is a imcomplete note in epic. We printed that A/P so you can review.  If you have dramatic worse nor changing signs or symptoms then recommend ED evaluation.  Follow up date to be determined after above study review.  Mackie Pai, PA-C    30 minutes spent with pt today.

## 2020-01-11 NOTE — Patient Instructions (Addendum)
For history of chf, panctopenia, cough and dyspnea will get cxr, bnp, cmp and cbc. Stat test today. Will adjust medication if needed.  On 01/06/2020 their is a imcomplete note in epic. We printed that A/P so you can review.  If you have dramatic worse nor changing signs or symptoms then recommend ED evaluation.  Pt at end of exam noted insomnia.  rx trazadone  Follow up date to be determined after above study review.

## 2020-01-11 NOTE — Addendum Note (Signed)
Addended by: Anabel Halon on: 01/11/2020 02:30 PM   Modules accepted: Orders

## 2020-01-16 ENCOUNTER — Other Ambulatory Visit (INDEPENDENT_AMBULATORY_CARE_PROVIDER_SITE_OTHER): Payer: Medicare Other

## 2020-01-16 ENCOUNTER — Telehealth: Payer: Self-pay | Admitting: Family Medicine

## 2020-01-16 ENCOUNTER — Other Ambulatory Visit: Payer: Self-pay

## 2020-01-16 DIAGNOSIS — I509 Heart failure, unspecified: Secondary | ICD-10-CM | POA: Diagnosis not present

## 2020-01-16 LAB — COMPREHENSIVE METABOLIC PANEL
ALT: 9 U/L (ref 0–53)
AST: 36 U/L (ref 0–37)
Albumin: 3.2 g/dL — ABNORMAL LOW (ref 3.5–5.2)
Alkaline Phosphatase: 448 U/L — ABNORMAL HIGH (ref 39–117)
BUN: 65 mg/dL — ABNORMAL HIGH (ref 6–23)
CO2: 23 mEq/L (ref 19–32)
Calcium: 8.5 mg/dL (ref 8.4–10.5)
Chloride: 103 mEq/L (ref 96–112)
Creatinine, Ser: 3.33 mg/dL — ABNORMAL HIGH (ref 0.40–1.50)
GFR: 22.31 mL/min — ABNORMAL LOW (ref >60.00)
Glucose, Bld: 113 mg/dL — ABNORMAL HIGH (ref 70–99)
Potassium: 3.4 mEq/L — ABNORMAL LOW (ref 3.5–5.1)
Sodium: 135 mEq/L (ref 135–145)
Total Bilirubin: 2 mg/dL — ABNORMAL HIGH (ref 0.2–1.2)
Total Protein: 7.3 g/dL (ref 6.0–8.3)

## 2020-01-16 NOTE — Telephone Encounter (Signed)
Pt came in office, pt had lab work to be done, pt stated if any results or any instructions for pt to follow to please send him a mychart message were pt can see instruction and follow it, and also to get to know his results, if any question please call at [phone]. Please advise.

## 2020-01-16 NOTE — Telephone Encounter (Signed)
FYI 

## 2020-01-20 ENCOUNTER — Other Ambulatory Visit: Payer: Self-pay

## 2020-01-20 ENCOUNTER — Encounter (HOSPITAL_BASED_OUTPATIENT_CLINIC_OR_DEPARTMENT_OTHER): Payer: Self-pay | Admitting: *Deleted

## 2020-01-20 ENCOUNTER — Ambulatory Visit (INDEPENDENT_AMBULATORY_CARE_PROVIDER_SITE_OTHER): Payer: Medicare Other | Admitting: Medical

## 2020-01-20 ENCOUNTER — Inpatient Hospital Stay (HOSPITAL_BASED_OUTPATIENT_CLINIC_OR_DEPARTMENT_OTHER)
Admission: EM | Admit: 2020-01-20 | Discharge: 2020-01-26 | DRG: 291 | Disposition: A | Discharge status: Home Health | Payer: Medicare Other | Attending: Student | Admitting: Student

## 2020-01-20 ENCOUNTER — Emergency Department (HOSPITAL_BASED_OUTPATIENT_CLINIC_OR_DEPARTMENT_OTHER): Payer: Medicare Other

## 2020-01-20 VITALS — BP 116/61 | HR 82 | Temp 97.2°F | Resp 18 | Ht 71.0 in | Wt 224.0 lb

## 2020-01-20 DIAGNOSIS — I2721 Secondary pulmonary arterial hypertension: Secondary | ICD-10-CM | POA: Diagnosis not present

## 2020-01-20 DIAGNOSIS — E876 Hypokalemia: Secondary | ICD-10-CM | POA: Diagnosis present

## 2020-01-20 DIAGNOSIS — F40240 Claustrophobia: Secondary | ICD-10-CM | POA: Diagnosis present

## 2020-01-20 DIAGNOSIS — E039 Hypothyroidism, unspecified: Secondary | ICD-10-CM | POA: Diagnosis not present

## 2020-01-20 DIAGNOSIS — F1021 Alcohol dependence, in remission: Secondary | ICD-10-CM | POA: Diagnosis present

## 2020-01-20 DIAGNOSIS — I5031 Acute diastolic (congestive) heart failure: Secondary | ICD-10-CM | POA: Diagnosis present

## 2020-01-20 DIAGNOSIS — K3189 Other diseases of stomach and duodenum: Secondary | ICD-10-CM | POA: Diagnosis present

## 2020-01-20 DIAGNOSIS — E785 Hyperlipidemia, unspecified: Secondary | ICD-10-CM | POA: Diagnosis present

## 2020-01-20 DIAGNOSIS — I5082 Biventricular heart failure: Secondary | ICD-10-CM | POA: Diagnosis present

## 2020-01-20 DIAGNOSIS — K7030 Alcoholic cirrhosis of liver without ascites: Secondary | ICD-10-CM | POA: Diagnosis not present

## 2020-01-20 DIAGNOSIS — Z7951 Long term (current) use of inhaled steroids: Secondary | ICD-10-CM | POA: Diagnosis not present

## 2020-01-20 DIAGNOSIS — R778 Other specified abnormalities of plasma proteins: Secondary | ICD-10-CM | POA: Diagnosis not present

## 2020-01-20 DIAGNOSIS — I130 Hypertensive heart and chronic kidney disease with heart failure and stage 1 through stage 4 chronic kidney disease, or unspecified chronic kidney disease: Secondary | ICD-10-CM | POA: Diagnosis not present

## 2020-01-20 DIAGNOSIS — K766 Portal hypertension: Secondary | ICD-10-CM | POA: Diagnosis not present

## 2020-01-20 DIAGNOSIS — E611 Iron deficiency: Secondary | ICD-10-CM | POA: Diagnosis present

## 2020-01-20 DIAGNOSIS — I50810 Right heart failure, unspecified: Secondary | ICD-10-CM | POA: Diagnosis not present

## 2020-01-20 DIAGNOSIS — I2510 Atherosclerotic heart disease of native coronary artery without angina pectoris: Secondary | ICD-10-CM | POA: Diagnosis present

## 2020-01-20 DIAGNOSIS — D61818 Other pancytopenia: Secondary | ICD-10-CM

## 2020-01-20 DIAGNOSIS — K7031 Alcoholic cirrhosis of liver with ascites: Secondary | ICD-10-CM | POA: Diagnosis present

## 2020-01-20 DIAGNOSIS — N183 Chronic kidney disease, stage 3 unspecified: Secondary | ICD-10-CM

## 2020-01-20 DIAGNOSIS — K7460 Unspecified cirrhosis of liver: Secondary | ICD-10-CM | POA: Diagnosis not present

## 2020-01-20 DIAGNOSIS — I248 Other forms of acute ischemic heart disease: Secondary | ICD-10-CM | POA: Diagnosis present

## 2020-01-20 DIAGNOSIS — G4700 Insomnia, unspecified: Secondary | ICD-10-CM | POA: Diagnosis present

## 2020-01-20 DIAGNOSIS — G43D0 Abdominal migraine, not intractable: Secondary | ICD-10-CM | POA: Diagnosis not present

## 2020-01-20 DIAGNOSIS — T502X5A Adverse effect of carbonic-anhydrase inhibitors, benzothiadiazides and other diuretics, initial encounter: Secondary | ICD-10-CM | POA: Diagnosis present

## 2020-01-20 DIAGNOSIS — I509 Heart failure, unspecified: Secondary | ICD-10-CM

## 2020-01-20 DIAGNOSIS — Z87891 Personal history of nicotine dependence: Secondary | ICD-10-CM

## 2020-01-20 DIAGNOSIS — K219 Gastro-esophageal reflux disease without esophagitis: Secondary | ICD-10-CM | POA: Diagnosis present

## 2020-01-20 DIAGNOSIS — Z8249 Family history of ischemic heart disease and other diseases of the circulatory system: Secondary | ICD-10-CM | POA: Diagnosis not present

## 2020-01-20 DIAGNOSIS — J449 Chronic obstructive pulmonary disease, unspecified: Secondary | ICD-10-CM | POA: Diagnosis not present

## 2020-01-20 DIAGNOSIS — R1011 Right upper quadrant pain: Secondary | ICD-10-CM

## 2020-01-20 DIAGNOSIS — I272 Pulmonary hypertension, unspecified: Secondary | ICD-10-CM | POA: Diagnosis not present

## 2020-01-20 DIAGNOSIS — I361 Nonrheumatic tricuspid (valve) insufficiency: Secondary | ICD-10-CM | POA: Diagnosis not present

## 2020-01-20 DIAGNOSIS — G43909 Migraine, unspecified, not intractable, without status migrainosus: Secondary | ICD-10-CM | POA: Diagnosis present

## 2020-01-20 DIAGNOSIS — I351 Nonrheumatic aortic (valve) insufficiency: Secondary | ICD-10-CM | POA: Diagnosis not present

## 2020-01-20 DIAGNOSIS — E781 Pure hyperglyceridemia: Secondary | ICD-10-CM | POA: Diagnosis present

## 2020-01-20 DIAGNOSIS — G4733 Obstructive sleep apnea (adult) (pediatric): Secondary | ICD-10-CM | POA: Diagnosis present

## 2020-01-20 DIAGNOSIS — Z79899 Other long term (current) drug therapy: Secondary | ICD-10-CM

## 2020-01-20 DIAGNOSIS — R079 Chest pain, unspecified: Secondary | ICD-10-CM | POA: Diagnosis not present

## 2020-01-20 DIAGNOSIS — R0600 Dyspnea, unspecified: Secondary | ICD-10-CM | POA: Diagnosis not present

## 2020-01-20 DIAGNOSIS — N1832 Chronic kidney disease, stage 3b: Secondary | ICD-10-CM | POA: Diagnosis present

## 2020-01-20 DIAGNOSIS — N179 Acute kidney failure, unspecified: Secondary | ICD-10-CM | POA: Diagnosis not present

## 2020-01-20 DIAGNOSIS — Z20822 Contact with and (suspected) exposure to covid-19: Secondary | ICD-10-CM | POA: Diagnosis not present

## 2020-01-20 DIAGNOSIS — I440 Atrioventricular block, first degree: Secondary | ICD-10-CM | POA: Diagnosis present

## 2020-01-20 DIAGNOSIS — K820 Obstruction of gallbladder: Secondary | ICD-10-CM | POA: Diagnosis not present

## 2020-01-20 DIAGNOSIS — I50813 Acute on chronic right heart failure: Secondary | ICD-10-CM

## 2020-01-20 DIAGNOSIS — M109 Gout, unspecified: Secondary | ICD-10-CM | POA: Diagnosis present

## 2020-01-20 DIAGNOSIS — I5033 Acute on chronic diastolic (congestive) heart failure: Secondary | ICD-10-CM | POA: Diagnosis not present

## 2020-01-20 DIAGNOSIS — R0602 Shortness of breath: Secondary | ICD-10-CM | POA: Diagnosis not present

## 2020-01-20 DIAGNOSIS — R519 Headache, unspecified: Secondary | ICD-10-CM | POA: Diagnosis not present

## 2020-01-20 DIAGNOSIS — R7989 Other specified abnormal findings of blood chemistry: Secondary | ICD-10-CM | POA: Diagnosis not present

## 2020-01-20 DIAGNOSIS — N189 Chronic kidney disease, unspecified: Secondary | ICD-10-CM | POA: Diagnosis not present

## 2020-01-20 LAB — CBC WITH DIFFERENTIAL/PLATELET
Abs Immature Granulocytes: 0.01 10*3/uL (ref 0.00–0.07)
Basophils Absolute: 0 10*3/uL (ref 0.0–0.1)
Basophils Relative: 0 %
Eosinophils Absolute: 0.1 10*3/uL (ref 0.0–0.5)
Eosinophils Relative: 3 %
HCT: 29 % — ABNORMAL LOW (ref 39.0–52.0)
Hemoglobin: 8.8 g/dL — ABNORMAL LOW (ref 13.0–17.0)
Immature Granulocytes: 1 %
Lymphocytes Relative: 27 %
Lymphs Abs: 0.5 10*3/uL — ABNORMAL LOW (ref 0.7–4.0)
MCH: 32.6 pg (ref 26.0–34.0)
MCHC: 30.3 g/dL (ref 30.0–36.0)
MCV: 107.4 fL — ABNORMAL HIGH (ref 80.0–100.0)
Monocytes Absolute: 0.2 10*3/uL (ref 0.1–1.0)
Monocytes Relative: 13 %
Neutro Abs: 1 10*3/uL — ABNORMAL LOW (ref 1.7–7.7)
Neutrophils Relative %: 56 %
Platelets: 30 10*3/uL — ABNORMAL LOW (ref 150–400)
RBC: 2.7 MIL/uL — ABNORMAL LOW (ref 4.22–5.81)
RDW: 18 % — ABNORMAL HIGH (ref 11.5–15.5)
WBC: 1.8 10*3/uL — ABNORMAL LOW (ref 4.0–10.5)
nRBC: 0 % (ref 0.0–0.2)

## 2020-01-20 LAB — COMPREHENSIVE METABOLIC PANEL
ALT: 12 U/L (ref 0–44)
AST: 45 U/L — ABNORMAL HIGH (ref 15–41)
Albumin: 3 g/dL — ABNORMAL LOW (ref 3.5–5.0)
Alkaline Phosphatase: 418 U/L — ABNORMAL HIGH (ref 38–126)
Anion gap: 8 (ref 5–15)
BUN: 59 mg/dL — ABNORMAL HIGH (ref 8–23)
CO2: 22 mmol/L (ref 22–32)
Calcium: 8.4 mg/dL — ABNORMAL LOW (ref 8.9–10.3)
Chloride: 105 mmol/L (ref 98–111)
Creatinine, Ser: 2.8 mg/dL — ABNORMAL HIGH (ref 0.61–1.24)
GFR calc Af Amer: 26 mL/min — ABNORMAL LOW (ref >60)
GFR calc non Af Amer: 22 mL/min — ABNORMAL LOW (ref >60)
Glucose, Bld: 112 mg/dL — ABNORMAL HIGH (ref 70–99)
Potassium: 3.5 mmol/L (ref 3.5–5.1)
Sodium: 135 mmol/L (ref 135–145)
Total Bilirubin: 1.9 mg/dL — ABNORMAL HIGH (ref 0.3–1.2)
Total Protein: 7.1 g/dL (ref 6.5–8.1)

## 2020-01-20 LAB — TROPONIN I (HIGH SENSITIVITY)
Troponin I (High Sensitivity): 33 ng/L — ABNORMAL HIGH (ref <18)
Troponin I (High Sensitivity): 34 ng/L — ABNORMAL HIGH (ref <18)

## 2020-01-20 LAB — BRAIN NATRIURETIC PEPTIDE: B Natriuretic Peptide: 506 pg/mL — ABNORMAL HIGH (ref 0.0–100.0)

## 2020-01-20 MED ORDER — ONDANSETRON HCL 4 MG/2ML IJ SOLN
4.0000 mg | Freq: Four times a day (QID) | INTRAMUSCULAR | Status: DC | PRN
  Administered 2020-01-22 – 2020-01-24 (×3): 4 mg via INTRAVENOUS
  Filled 2020-01-20 (×3): qty 2

## 2020-01-20 MED ORDER — PANTOPRAZOLE SODIUM 40 MG PO TBEC
40.0000 mg | DELAYED_RELEASE_TABLET | Freq: Every day | ORAL | Status: DC
  Administered 2020-01-21 – 2020-01-26 (×7): 40 mg via ORAL
  Filled 2020-01-20 (×7): qty 1

## 2020-01-20 MED ORDER — SODIUM CHLORIDE 0.9% FLUSH
3.0000 mL | INTRAVENOUS | Status: DC | PRN
  Filled 2020-01-20: qty 3

## 2020-01-20 MED ORDER — THIAMINE HCL 100 MG PO TABS
100.0000 mg | ORAL_TABLET | Freq: Every day | ORAL | Status: DC
  Administered 2020-01-21 – 2020-01-26 (×7): 100 mg via ORAL
  Filled 2020-01-20 (×7): qty 1

## 2020-01-20 MED ORDER — FERROUS FUMARATE 324 (106 FE) MG PO TABS
1.0000 | ORAL_TABLET | Freq: Every day | ORAL | Status: DC
  Administered 2020-01-21 – 2020-01-26 (×6): 106 mg via ORAL
  Filled 2020-01-20 (×8): qty 1

## 2020-01-20 MED ORDER — HYDROXYZINE HCL 25 MG PO TABS: 25.0000 mg | ORAL_TABLET | Freq: Three times a day (TID) | ORAL | Status: DC | PRN

## 2020-01-20 MED ORDER — FUROSEMIDE 10 MG/ML IJ SOLN
80.0000 mg | Freq: Once | INTRAMUSCULAR | Status: AC
  Administered 2020-01-20: 80 mg via INTRAVENOUS
  Filled 2020-01-20: qty 8

## 2020-01-20 MED ORDER — MACITENTAN 10 MG PO TABS
10.0000 mg | ORAL_TABLET | Freq: Every day | ORAL | Status: DC
  Administered 2020-01-21 – 2020-01-26 (×6): 10 mg via ORAL
  Filled 2020-01-20 (×8): qty 1

## 2020-01-20 MED ORDER — FUROSEMIDE 10 MG/ML IJ SOLN
80.0000 mg | Freq: Two times a day (BID) | INTRAMUSCULAR | Status: DC
  Administered 2020-01-21 (×2): 80 mg via INTRAVENOUS
  Filled 2020-01-20 (×2): qty 8

## 2020-01-20 MED ORDER — SODIUM CHLORIDE 0.9 % IV SOLN: 250.0000 mL | INTRAVENOUS | Status: DC | PRN

## 2020-01-20 MED ORDER — FOLIC ACID 1 MG PO TABS
1.0000 mg | ORAL_TABLET | Freq: Every day | ORAL | Status: DC
  Administered 2020-01-21 – 2020-01-26 (×7): 1 mg via ORAL
  Filled 2020-01-20 (×7): qty 1

## 2020-01-20 MED ORDER — SELEXIPAG 1000 MCG PO TABS
1000.0000 ug | ORAL_TABLET | Freq: Two times a day (BID) | ORAL | Status: DC
  Administered 2020-01-21 – 2020-01-26 (×11): 1000 ug via ORAL
  Filled 2020-01-20 (×13): qty 1

## 2020-01-20 MED ORDER — TADALAFIL (PAH) 20 MG PO TABS
40.0000 mg | ORAL_TABLET | Freq: Every day | ORAL | Status: DC
  Filled 2020-01-20: qty 2

## 2020-01-20 MED ORDER — ROSUVASTATIN CALCIUM 5 MG PO TABS
5.0000 mg | ORAL_TABLET | Freq: Every day | ORAL | Status: DC
  Administered 2020-01-21 – 2020-01-26 (×7): 5 mg via ORAL
  Filled 2020-01-20 (×8): qty 1

## 2020-01-20 MED ORDER — ACETAMINOPHEN 500 MG PO TABS: 1000.0000 mg | ORAL_TABLET | Freq: Four times a day (QID) | ORAL | Status: DC | PRN

## 2020-01-20 MED ORDER — HEPARIN SODIUM (PORCINE) 5000 UNIT/ML IJ SOLN: 5000.0000 [IU] | Freq: Three times a day (TID) | INTRAMUSCULAR | Status: DC

## 2020-01-20 MED ORDER — TADALAFIL 20 MG PO TABS
40.0000 mg | ORAL_TABLET | Freq: Every day | ORAL | Status: DC
  Administered 2020-01-21 – 2020-01-26 (×6): 40 mg via ORAL
  Filled 2020-01-20 (×6): qty 2

## 2020-01-20 MED ORDER — SODIUM CHLORIDE 0.9% FLUSH
3.0000 mL | Freq: Two times a day (BID) | INTRAVENOUS | Status: DC
  Administered 2020-01-21 – 2020-01-25 (×10): 3 mL via INTRAVENOUS
  Filled 2020-01-20: qty 3

## 2020-01-20 MED ORDER — ALLOPURINOL 100 MG PO TABS
200.0000 mg | ORAL_TABLET | Freq: Every day | ORAL | Status: DC
  Administered 2020-01-21 – 2020-01-26 (×7): 200 mg via ORAL
  Filled 2020-01-20 (×8): qty 2

## 2020-01-20 MED ORDER — ALBUTEROL SULFATE (2.5 MG/3ML) 0.083% IN NEBU: 2.5000 mg | INHALATION_SOLUTION | Freq: Four times a day (QID) | RESPIRATORY_TRACT | Status: DC | PRN

## 2020-01-20 MED ORDER — ACETAMINOPHEN 325 MG PO TABS
650.0000 mg | ORAL_TABLET | ORAL | Status: DC | PRN
  Administered 2020-01-22 (×2): 650 mg via ORAL
  Filled 2020-01-20 (×2): qty 2

## 2020-01-20 MED ORDER — MOMETASONE FURO-FORMOTEROL FUM 200-5 MCG/ACT IN AERO
2.0000 | INHALATION_SPRAY | Freq: Two times a day (BID) | RESPIRATORY_TRACT | Status: DC
  Administered 2020-01-21 – 2020-01-26 (×11): 2 via RESPIRATORY_TRACT
  Filled 2020-01-20 (×2): qty 8.8

## 2020-01-20 NOTE — ED Notes (Signed)
Monitor on Pt

## 2020-01-20 NOTE — ED Provider Notes (Signed)
Medical screening examination/treatment/procedure(s) were conducted as a shared visit with non-physician practitioner(s) and myself.  I personally evaluated the patient during the encounter.  EKG Interpretation  Date/Time:  Friday January 20 2020 14:42:51 EDT Ventricular Rate:  82 PR Interval:    QRS Duration: 107 QT Interval:  431 QTC Calculation: 504 R Axis:   115 Text Interpretation: Sinus rhythm Prolonged PR interval Probable left atrial enlargement Posterior infarct, acute (LCx) Lateral leads are also involved Prolonged QT interval ST depression V1-V3, suggest recording posterior leads unchanged from previous Confirmed by Wandra Arthurs [id number]) on 01/20/2020 2:46:52 PM  Pt presents with chf exacerbation.  Labs are at baseline.  Pt appears stable.  No resp difficulty.  Oxygen sat 100%.  Will give lasix 80 IV.  Plan on consult with cardiology CHF clinic to see about follow up tomorrow for reassessment.   Dorie Rank, MD 01/20/20 [phone]

## 2020-01-20 NOTE — Patient Instructions (Addendum)
History of chf, renal impairment, pancytopenia and recent shortness of breath. Recent 6 lb weight gain with increased sob. Last gfr and CR numbers worse. As we approach weekend/friday I do recommend ED evaluation to get stat real time cmp, cbc, bnp and chest xray to determine level of diuresis that may be needed. Consult with cardiologist may be needed.  I called down to ED today to inform them of your recent presentation. Please go down now and well follow your ED work up.  Follow up date to be determined based on ED recommendation.

## 2020-01-20 NOTE — ED Notes (Signed)
Belongings with pt: cap, glasses, smart watch, one metal ring, mobile phone and phone charge with pt to [facility], Darren Mueller, has pts clothes, medicine to his home

## 2020-01-20 NOTE — ED Provider Notes (Signed)
[facility] EMERGENCY DEPARTMENT Provider Note   CSN: [id number] Arrival date & time: 01/20/20  1421     History Chief Complaint  Patient presents with  . Shortness of Breath  . Chest Pain    Darren Mueller is a 69 y.o. male with PMH significant for alcoholic cirrhosis, CAD, CKD, PAH, COPD, CHF, HTN, HLD, and pancytopenia who presents to the ED from his primary care provider for a 1 week history of intermittent chest pain, dry cough, and worsening shortness of breath.  Patient reports that he takes 60 mg torsemide BID.  He has noted a 8 pound weight gain in the past week and suspects that he is fluid overloaded.  His legs are significantly swollen and tender bilaterally.  He reports that he sleeps in a chair sitting up so there is likely a degree of dependent edema in addition to fluid overload.  He also has been using more pillows and endorsing orthopnea.  Shortness of breath is worse with exertion.  He denies any recent illness, fevers or chills, abdominal pain, nausea vomiting, productive cough, numbness or weakness, or other symptoms.  He is still able to ambulate.  I reviewed patient's medical record and he was recommended to come to the ED by his PCP at [facility] for stat real-time CMP, CBC, BMP, and chest x-ray to determine required diuresis.  HPI     Past Medical History:  Diagnosis Date  . Alcohol dependence ([place]) 03/22/2011   In remission   . Alcohol dependence in remission ([place]) 03/22/2011   In remission   . Alcoholic cirrhosis of liver with ascites ([place]) 2014  . Anginal pain ([place])    with SOB admitted 04/20/2019  . Cardiomegaly 02/2019   Noted on CXR  . CHF (congestive heart failure) ([place])   . CKD (chronic kidney disease), stage III 07/18/2013  . COPD (chronic obstructive pulmonary disease) ([place])   . Coronary artery disease   . Dermatitis 06/10/2015  . Diarrhea 09/04/2013  . Dyspnea    admitted 04/20/2019  . Elbow pain, left 03/17/2017  . Eustachian tube  dysfunction   . GERD (gastroesophageal reflux disease)   . Gout   . Headache   . Hearing loss   . Heart murmur   . Hx of colonic polyps   . Hypertension   . Hypertriglyceridemia 03/17/2017  . Hypopotassemia   . Otalgia 11/28/2013  . Other chronic pulmonary heart diseases   . PAH (pulmonary artery hypertension) (H[facility])    RV failure  . Pancytopenia ([place])   . Pleural effusion 2014   Noted on CT:   Moderately large simple layering right pleural effusion   . Preventative health care 06/29/2016  . Pulmonary nodule 2014   Noted on CT: An 8 mm pulmonary nodule in the periphery of the left lower lobe  . Rectal bleeding   . Red eye 03/03/2016   right  . Seizures ([place])   . Sleep apnea    does not wear CPAP  . Splenomegaly 12/2015   Noted on CT  . Thoracic aorta atherosclerosis ([place]) 12/2017  . Thrombocytopenia ([place]) 06/29/2016  . Unspecified hypothyroidism 01/25/2013  . Unspecified pleural effusion   . Vertigo   . Wears glasses     Patient Active Problem List   Diagnosis Date Noted  . Abdominal pain 08/23/2019  . Grief 08/23/2019  . Acute on chronic diastolic heart failure ([place]) 05/16/2019  . Acute on chronic diastolic (congestive) heart failure ([place]) 05/16/2019  . Calf pain  04/03/2019  . History of colonic polyps   . Polyp of cecum   . Headache 02/21/2019  . Rectal bleeding 02/13/2019  . COPD (chronic obstructive pulmonary disease) ([place]) 02/13/2019  . Acute on chronic right heart failure ([place]) 02/13/2019  . Hematochezia   . Ascites   . Dark stools   . Alcoholism ([place])   . Hyperglycemia 05/04/2018  . Seizure disorder ([place]) 02/01/2018  . Pedal edema 10/01/2017  . RUQ pain 05/06/2017  . Decreased range of motion of left elbow 04/15/2017  . Hyperlipidemia 03/17/2017  . Elbow pain, left 03/17/2017  . Right rib fracture   . Syncope 07/19/2016  . Thrombocytopenia ([place]) 06/29/2016  . Hoarseness, chronic 06/29/2016  . Preventative health care 06/29/2016  . Chest pain   . Low  back pain 12/09/2015  . Cirrhosis with alcoholism ([place]) 08/08/2015  . Pancytopenia ([place]) 06/10/2015  . Dermatitis 06/10/2015  . OSA (obstructive sleep apnea) 02/13/2015  . Gout 07/30/2014  . Diarrhea 09/04/2013  . Chronic renal insufficiency, stage III (moderate) ([place]) 07/18/2013  . Right heart failure due to pulmonary hypertension ([place]) 06/06/2013  . Hypothyroidism 01/25/2013  . PAH (pulmonary arterial hypertension) with portal hypertension ([place]) 01/21/2013  . Allergic rhinitis 12/21/2012  . Secondary pulmonary hypertension 12/09/2012  . Hepatic cirrhosis ([place]) 11/26/2012  . GERD (gastroesophageal reflux disease) 09/05/2011  . Hypokalemia 08/29/2011  . Colon polyps 03/26/2011  . Insomnia 03/26/2011  . Hypertension 03/22/2011  . Alcohol dependence ([place]) 03/22/2011    Past Surgical History:  Procedure Laterality Date  . CARDIAC CATHETERIZATION N/A 12/21/2015   Procedure: Right Heart Cath;  Surgeon: Jolaine Artist, MD;  Location: [facility] CV LAB;  Service: Cardiovascular;  Laterality: N/A;  . CARDIAC CATHETERIZATION N/A 04/15/2016   Procedure: Right/Left Heart Cath and Coronary Angiography;  Surgeon: Jolaine Artist, MD;  Location: [facility] CV LAB;  Service: Cardiovascular;  Laterality: N/A;  . COLONOSCOPY N/A 12/08/2013   Procedure: COLONOSCOPY;  Surgeon: Milus Banister, MD;  Location: [facility] ENDOSCOPY;  Service: Endoscopy;  Laterality: N/A;  . COLONOSCOPY WITH PROPOFOL N/A 03/24/2019   Procedure: COLONOSCOPY WITH PROPOFOL;  Surgeon: Milus Banister, MD;  Location: [facility] ENDOSCOPY;  Service: Endoscopy;  Laterality: N/A;  Draw CBC in preop  . COLONOSCOPY WITH PROPOFOL N/A 04/28/2019   Procedure: COLONOSCOPY WITH PROPOFOL;  Surgeon: Milus Banister, MD;  Location: [facility] ENDOSCOPY;  Service: Endoscopy;  Laterality: N/A;  . ESOPHAGOGASTRODUODENOSCOPY (EGD) WITH PROPOFOL N/A 03/27/2016   Procedure: ESOPHAGOGASTRODUODENOSCOPY (EGD) WITH PROPOFOL;  Surgeon: Milus Banister, MD;  Location:  [facility];  Service: Endoscopy;  Laterality: N/A;  . ESOPHAGOGASTRODUODENOSCOPY (EGD) WITH PROPOFOL N/A 02/14/2019   Procedure: ESOPHAGOGASTRODUODENOSCOPY (EGD) WITH PROPOFOL;  Surgeon: Jerene Bears, MD;  Location: [facility] ENDOSCOPY;  Service: Gastroenterology;  Laterality: N/A;  . EYE SURGERY Bilateral 03/15/2019   lasix  . GAS/FLUID EXCHANGE Right 09/22/2019   Procedure: GAS/FLUID EXCHANGE (C3F8) RIGHT EYE;  Surgeon: Sherlynn Stalls, MD;  Location: [facility];  Service: Ophthalmology;  Laterality: Right;  . HEMOSTASIS CLIP PLACEMENT  04/28/2019   Procedure: HEMOSTASIS CLIP PLACEMENT;  Surgeon: Milus Banister, MD;  Location: [facility] ENDOSCOPY;  Service: Endoscopy;;  . LOOP RECORDER INSERTION N/A 01/01/2017   Procedure: Loop Recorder Insertion;  Surgeon: Thompson Grayer, MD;  Location: [facility] CV LAB;  Service: Cardiovascular;  Laterality: N/A;  . PARS PLANA VITRECTOMY Right 09/22/2019   Procedure: PARS PLANA VITRECTOMY WITH 25 GAUGE RIGHT EYE;  Surgeon: Sherlynn Stalls, MD;  Location: [facility];  Service: Ophthalmology;  Laterality: Right;  . PHOTOCOAGULATION WITH LASER Right 09/22/2019   Procedure: PHOTOCOAGULATION WITH LASER RIGHT EYE;  Surgeon: Sherlynn Stalls, MD;  Location: [facility];  Service: Ophthalmology;  Laterality: Right;  . POLYPECTOMY  04/28/2019   Procedure: POLYPECTOMY;  Surgeon: Milus Banister, MD;  Location: [facility];  Service: Endoscopy;;  . RIGHT HEART CATHETERIZATION Right 06/06/2014   Procedure: RIGHT HEART CATH;  Surgeon: Jolaine Artist, MD;  Location: [facility] CATH LAB;  Service: Cardiovascular;  Laterality: Right;  . UVULOPALATOPHARYNGOPLASTY  1999       Family History  Problem Relation Age of Onset  . Heart disease Mother   . Heart attack Mother   . Hypertension Mother   . Kidney failure Mother   . Liver disease Mother        stage 4 liver disease  . Prostate cancer Father   . Cancer Father        lung  . Hypertension Brother   . Gout Brother   . Alcoholism Maternal  Grandfather   . Liver disease Maternal Grandfather   . Migraines Sister   . Hypertension Brother   . Colon cancer Neg Hx     Social History   Tobacco Use  . Smoking status: Former Smoker    Packs/day: 2.00    Years: 5.00    Pack years: 10.00    Types: Cigarettes    Quit date: 09/22/1979    Years since quitting: 40.3  . Smokeless tobacco: Never Used  Substance Use Topics  . Alcohol use: Yes    Alcohol/week: 6.0 standard drinks    Types: 6 Shots of liquor per week    Comment: social drinker  . Drug use: No    Home Medications Prior to Admission medications   Medication Sig Start Date End Date Taking? Authorizing Provider  acetaminophen (TYLENOL) 500 MG tablet Take 1,000 mg by mouth every 6 (six) hours as needed (for pain.).    [provider]  ADVAIR [facility] [phone] MCG/ACT inhaler USE 2 INHALATIONS EVERY MORNING 11/04/19   Mosie Lukes, MD  allopurinol (ZYLOPRIM) 100 MG tablet Take 2 tablets (200 mg total) by mouth daily. 07/12/19   Mosie Lukes, MD  Blood Pressure Monitoring (BLOOD PRESSURE CUFF) MISC Use as directed. Check blood pressure bid prn Dx:I10 02/22/19   Mosie Lukes, MD  diphenoxylate-atropine (LOMOTIL) 2.5-0.025 MG tablet Take 1 tablet by mouth 4 (four) times daily as needed for diarrhea or loose stools. 12/01/19   Mosie Lukes, MD  Ferrous Fumarate (HEMOCYTE) 324 (106 Fe) MG TABS tablet Take 1 tablet (106 mg of iron total) by mouth daily. 05/05/19   Mosie Lukes, MD  folic acid (FOLVITE) 1 MG tablet Take 1 tablet (1 mg total) by mouth daily. 01/11/20   Saguier, Percell Miller, PA-C  hydrOXYzine (ATARAX/VISTARIL) 25 MG tablet Take 1 tablet (25 mg total) by mouth 3 (three) times daily as needed. 08/30/19   Mosie Lukes, MD  hyoscyamine (LEVSIN SL) 0.125 MG SL tablet Place 1 tablet (0.125 mg total) under the tongue every 6 (six) hours as needed. 08/23/19   Mosie Lukes, MD  macitentan (OPSUMIT) 10 MG tablet Take 1 tablet (10 mg total) by mouth daily. 09/26/19    Bensimhon, Shaune Pascal, MD  mometasone-formoterol (DULERA) 100-5 MCG/ACT AERO Inhale 2 puffs into the lungs 2 (two) times daily.    [provider]  Multiple Vitamin (MULTIVITAMIN) tablet Take 1 tablet by mouth daily.    [provider]  mupirocin ointment (BACTROBAN) 2 % Place 1 application into the nose at bedtime. 12/01/19   Mosie Lukes, MD  omeprazole (PRILOSEC) 40 MG capsule TAKE 1 CAPSULE BY MOUTH ONCE DAILY 11/14/19   Saguier, Percell Miller, PA-C  Potassium Chloride ER 20 MEQ TBCR Take 80 mEq by mouth every morning AND 60 mEq every evening. 12/06/19   Bensimhon, Shaune Pascal, MD  rosuvastatin (CRESTOR) 5 MG tablet TAKE 1 TABLET DAILY 08/31/19   Mosie Lukes, MD  Selexipag (UPTRAVI) 1000 MCG TABS Take 1,000 mcg by mouth 2 (two) times daily. 06/03/19   Bensimhon, Shaune Pascal, MD  tadalafil, PAH, (ADCIRCA) 20 MG tablet Take 2 tablets (40 mg total) by mouth daily. 11/18/19   Bensimhon, Shaune Pascal, MD  thiamine 100 MG tablet Take 1 tablet (100 mg total) by mouth daily. 05/05/19   Mosie Lukes, MD  torsemide (DEMADEX) 20 MG tablet Take 3 tablets (60 mg total) by mouth 2 (two) times daily. 11/11/19   Bensimhon, Shaune Pascal, MD  traZODone (DESYREL) 50 MG tablet Take 0.5-1 tablets (25-50 mg total) by mouth at bedtime as needed for sleep. 01/11/20   Saguier, Percell Miller, PA-C    Allergies    Aspirin and Nitroglycerin  Review of Systems   Review of Systems  All other systems reviewed and are negative.   Physical Exam Updated Vital Signs BP 122/78 (BP Location: Right Arm)   Pulse 82   Temp 97.9 F (36.6 C) (Oral)   Resp (!) 22   Ht 6' (1.829 m)   Wt 101.6 kg   SpO2 100%   BMI 30.38 kg/m   Physical Exam Vitals and nursing note reviewed. Exam conducted with a chaperone present.  Constitutional:      General: He is not in acute distress.    Appearance: Normal appearance.  HENT:     Head: Normocephalic and atraumatic.  Eyes:     General: No scleral icterus.    Conjunctiva/sclera:  Conjunctivae normal.  Cardiovascular:     Pulses: Normal pulses.     Heart sounds: Normal heart sounds.  Pulmonary:     Effort: Pulmonary effort is normal. No respiratory distress.  Abdominal:     General: Abdomen is flat.     Palpations: Abdomen is soft.     Tenderness: There is no abdominal tenderness. There is no guarding.  Musculoskeletal:     Cervical back: Normal range of motion. No rigidity.     Comments: Significant bilateral lower extremity swelling.  3+ pitting edema.  Tender to palpation.  Distal pulses intact.  Sensation intact throughout.  ROM intact.  Skin:    General: Skin is dry.     Capillary Refill: Capillary refill takes less than 2 seconds.  Neurological:     Mental Status: He is alert and oriented to person, place, and time.     GCS: GCS eye subscore is 4. GCS verbal subscore is 5. GCS motor subscore is 6.     Gait: Gait normal.  Psychiatric:        Mood and Affect: Mood normal.        Behavior: Behavior normal.        Thought Content: Thought content normal.       ED Results / Procedures / Treatments   Labs (all labs ordered are listed, but only abnormal results are displayed) Labs Reviewed  CBC WITH DIFFERENTIAL/PLATELET - Abnormal; Notable for the following components:      Result Value   WBC 1.8 (*)  RBC 2.70 (*)    Hemoglobin 8.8 (*)    HCT 29.0 (*)    MCV 107.4 (*)    RDW 18.0 (*)    Platelets 30 (*)    Neutro Abs 1.0 (*)    Lymphs Abs 0.5 (*)    All other components within normal limits  COMPREHENSIVE METABOLIC PANEL - Abnormal; Notable for the following components:   Glucose, Bld 112 (*)    BUN 59 (*)    Creatinine, Ser 2.80 (*)    Calcium 8.4 (*)    Albumin 3.0 (*)    AST 45 (*)    Alkaline Phosphatase 418 (*)    Total Bilirubin 1.9 (*)    GFR calc non Af Amer 22 (*)    GFR calc Af Amer 26 (*)    All other components within normal limits  BRAIN NATRIURETIC PEPTIDE - Abnormal; Notable for the following components:   B  Natriuretic Peptide 506.0 (*)    All other components within normal limits  TROPONIN I (HIGH SENSITIVITY) - Abnormal; Notable for the following components:   Troponin I (High Sensitivity) 33 (*)    All other components within normal limits  TROPONIN I (HIGH SENSITIVITY) - Abnormal; Notable for the following components:   Troponin I (High Sensitivity) 34 (*)    All other components within normal limits  SARS CORONAVIRUS 2 (TAT 6-24 HRS)    EKG EKG Interpretation  Date/Time:  Friday January 20 2020 14:42:51 EDT Ventricular Rate:  82 PR Interval:    QRS Duration: 107 QT Interval:  431 QTC Calculation: 504 R Axis:   115 Text Interpretation: Sinus rhythm Prolonged PR interval Probable left atrial enlargement Posterior infarct, acute (LCx) Lateral leads are also involved Prolonged QT interval ST depression V1-V3, suggest recording posterior leads unchanged from previous Confirmed by Wandra Arthurs [phone]) on 01/20/2020 2:46:52 PM   Radiology DG Chest Port 1 View  Result Date: 01/20/2020 CLINICAL DATA:  Shortness of breath and chest pain EXAM: PORTABLE CHEST 1 VIEW COMPARISON:  01/11/2020, 12/08/2019 FINDINGS: Cardiomegaly with mild central congestion. No consolidation, pleural effusion or pneumothorax. Recording device over the left lower chest. IMPRESSION: Cardiomegaly with mild central vascular congestion Electronically Signed   By: Donavan Foil M.D.   On: 01/20/2020 15:01    Procedures Procedures (including critical care time)  Medications Ordered in ED Medications  furosemide (LASIX) injection 80 mg (80 mg Intravenous Given 01/20/20 1643)    ED Course  I have reviewed the triage vital signs and the nursing notes.  Pertinent labs & imaging results that were available during my care of the patient were reviewed by me and considered in my medical decision making (see chart for details).    MDM Rules/Calculators/A&P                      Patient's BNP is elevated to 506, but down  from 1119 and labs obtained 8 months ago when he was admitted for IV diuresis.  CMP demonstrates impaired renal function, but consistent with patient's baseline.  CBC consistent with his baseline and history of pancytopenia.  His vital signs here in the ED have been reassuring and his plain films obtained of chest demonstrate mild central vascular congestion, but without any significant pleural effusion or other acute findings.    Patient is followed by [facility] Haroldine Laws [facility] for his CHF and portal pulmonary hypertension with severe RV dysfunction.  Cardiac catheterization performed in 2020 demonstrated  minimal CAD and preserved cardiac output.  NYHA III.  Will provide patient with 80 mg IV furosemide here in the ED and consult with his cardiology group.  Hopefully patient can follow-up with outpatient heart failure clinic tomorrow.  Spoke with Dr. Sallyanne Kuster who will set patient up for an appointment with the outpatient heart failure clinic tomorrow for ongoing evaluation and management.   Spoke with Dr. Sallyanne Kuster, cardiology, and patient would not be able to be evaluated for IV diuresis at outpatient heart failure clinic until Monday at the earliest.  Patient states that he has not felt this poor since his prior admission for several days of IV diuresis.  Will consult hospitalist for admission.   Spoke with Dr. Marcello Moores who will admit patient to [facility] for IV diuresis.  Final Clinical Impression(s) / ED Diagnoses Final diagnoses:  Shortness of breath    Rx / DC Orders ED Discharge Orders    None       Reita Chard 01/20/20 Gustavus Bryant, MD 01/21/20 1214

## 2020-01-20 NOTE — ED Triage Notes (Addendum)
Sob for a week. Chest pain that comes and goes. He was seen by his MD and told to come here. He has a Occupational hygienist. It has not gone off per pt. His feet are swollen.  He had his second Covid Vaccine 2 days ago.

## 2020-01-20 NOTE — Progress Notes (Signed)
Subjective:    Patient ID: Darren Mueller, male    DOB: [birth year], 69 y.o.   MRN: [id number]  HPI  Pt in for follow up.  See last note  Pt did cut back to 40 mg twice daily as I instructed. I advised this based on fact gfr decreased and Cr did decrease. Formerly had advised to get on torsemide 60 mg twice daily which was instruction in epic by his cardiologist.  However this appears to have dropped gfr and increase gfr. He has known hx of cardiorenal syndrome on review of his cardiologist last note in note section mentions ideal weight closer to 200 lb.     Pt did increase k tab for a couple of days.   I had pt to come in today for repeat labs and had wanted him to see Dr. Charlett Blake pcp however she is not in office today.   Pt weight has increased to 224 lb . Up from 219 lb.   Review of Systems  Constitutional: Negative for chills and fatigue.  Respiratory: Positive for shortness of breath. Negative for cough, chest tightness and wheezing.        Pt has copd. He tried to use his advair but did not help.  Cardiovascular: Negative for chest pain and palpitations.  Neurological: Negative for dizziness, speech difficulty, weakness, numbness and headaches.  Hematological: Negative for adenopathy. Does not bruise/bleed easily.    Past Medical History:  Diagnosis Date  . Alcohol dependence ([place]) 03/22/2011   In remission   . Alcohol dependence in remission ([place]) 03/22/2011   In remission   . Alcoholic cirrhosis of liver with ascites ([place]) 2014  . Anginal pain ([place])    with SOB admitted 04/20/2019  . Cardiomegaly 02/2019   Noted on CXR  . CHF (congestive heart failure) ([place])   . CKD (chronic kidney disease), stage III 07/18/2013  . COPD (chronic obstructive pulmonary disease) ([place])   . Coronary artery disease   . Dermatitis 06/10/2015  . Diarrhea 09/04/2013  . Dyspnea    admitted 04/20/2019  . Elbow pain, left 03/17/2017  . Eustachian tube dysfunction   . GERD (gastroesophageal  reflux disease)   . Gout   . Headache   . Hearing loss   . Heart murmur   . Hx of colonic polyps   . Hypertension   . Hypertriglyceridemia 03/17/2017  . Hypopotassemia   . Otalgia 11/28/2013  . Other chronic pulmonary heart diseases   . PAH (pulmonary artery hypertension) ([facility])    RV failure  . Pancytopenia ([place])   . Pleural effusion 2014   Noted on CT:   Moderately large simple layering right pleural effusion   . Preventative health care 06/29/2016  . Pulmonary nodule 2014   Noted on CT: An 8 mm pulmonary nodule in the periphery of the left lower lobe  . Rectal bleeding   . Red eye 03/03/2016   right  . Seizures ([place])   . Sleep apnea    does not wear CPAP  . Splenomegaly 12/2015   Noted on CT  . Thoracic aorta atherosclerosis ([place]) 12/2017  . Thrombocytopenia ([place]) 06/29/2016  . Unspecified hypothyroidism 01/25/2013  . Unspecified pleural effusion   . Vertigo   . Wears glasses      Social History   Socioeconomic History  . Marital status: Divorced    Spouse name: Not on file  . Number of children: 3  . Years of education: Not on file  . Highest  education level: Not on file  Occupational History  . Occupation: Retired    Comment: Social research officer, government  Tobacco Use  . Smoking status: Former Smoker    Packs/day: 2.00    Years: 5.00    Pack years: 10.00    Types: Cigarettes    Quit date: 09/22/1979    Years since quitting: 40.3  . Smokeless tobacco: Never Used  Substance and Sexual Activity  . Alcohol use: Yes    Alcohol/week: 6.0 standard drinks    Types: 6 Shots of liquor per week    Comment: social drinker  . Drug use: No  . Sexual activity: Yes    Birth control/protection: Spermicide  Other Topics Concern  . Not on file  Social History Narrative   0 caffeine drinks daily    Social Determinants of Health   Financial Resource Strain: Low Risk   . Difficulty of Paying Living Expenses: Not hard at all  Food Insecurity: No Food Insecurity  . Worried About Ship broker in the Last Year: Never true  . Ran Out of Food in the Last Year: Never true  Transportation Needs: No Transportation Needs  . Lack of Transportation (Medical): No  . Lack of Transportation (Non-Medical): No  Physical Activity: Inactive  . Days of Exercise per Week: 0 days  . Minutes of Exercise per Session: 0 min  Stress: Stress Concern Present  . Feeling of Stress : Rather much  Social Connections: Slightly Isolated  . Frequency of Communication with Friends and Family: More than three times a week  . Frequency of Social Gatherings with Friends and Family: Three times a week  . Attends Religious Services: More than 4 times per year  . Active Member of Clubs or Organizations: Yes  . Attends Archivist Meetings: More than 4 times per year  . Marital Status: Divorced  Human resources officer Violence:   . Fear of Current or Ex-Partner:   . Emotionally Abused:   Marland Kitchen Physically Abused:   . Sexually Abused:     Past Surgical History:  Procedure Laterality Date  . CARDIAC CATHETERIZATION N/A 12/21/2015   Procedure: Right Heart Cath;  Surgeon: Jolaine Artist, MD;  Location: [facility];  Service: Cardiovascular;  Laterality: N/A;  . CARDIAC CATHETERIZATION N/A 04/15/2016   Procedure: Right/Left Heart Cath and Coronary Angiography;  Surgeon: Jolaine Artist, MD;  Location: [facility];  Service: Cardiovascular;  Laterality: N/A;  . COLONOSCOPY N/A 12/08/2013   Procedure: COLONOSCOPY;  Surgeon: Milus Banister, MD;  Location: [facility];  Service: Endoscopy;  Laterality: N/A;  . COLONOSCOPY WITH PROPOFOL N/A 03/24/2019   Procedure: COLONOSCOPY WITH PROPOFOL;  Surgeon: Milus Banister, MD;  Location: [facility];  Service: Endoscopy;  Laterality: N/A;  Draw CBC in preop  . COLONOSCOPY WITH PROPOFOL N/A 04/28/2019   Procedure: COLONOSCOPY WITH PROPOFOL;  Surgeon: Milus Banister, MD;  Location: [facility];  Service: Endoscopy;  Laterality: N/A;  .  ESOPHAGOGASTRODUODENOSCOPY (EGD) WITH PROPOFOL N/A 03/27/2016   Procedure: ESOPHAGOGASTRODUODENOSCOPY (EGD) WITH PROPOFOL;  Surgeon: Milus Banister, MD;  Location: [facility];  Service: Endoscopy;  Laterality: N/A;  . ESOPHAGOGASTRODUODENOSCOPY (EGD) WITH PROPOFOL N/A 02/14/2019   Procedure: ESOPHAGOGASTRODUODENOSCOPY (EGD) WITH PROPOFOL;  Surgeon: Jerene Bears, MD;  Location: [facility];  Service: Gastroenterology;  Laterality: N/A;  . EYE SURGERY Bilateral 03/15/2019   lasix  . GAS/FLUID EXCHANGE Right 09/22/2019   Procedure: GAS/FLUID EXCHANGE (C3F8) RIGHT EYE;  Surgeon: Sherlynn Stalls,  MD;  Location: [facility];  Service: Ophthalmology;  Laterality: Right;  . HEMOSTASIS CLIP PLACEMENT  04/28/2019   Procedure: HEMOSTASIS CLIP PLACEMENT;  Surgeon: Milus Banister, MD;  Location: [facility];  Service: Endoscopy;;  . LOOP RECORDER INSERTION N/A 01/01/2017   Procedure: Loop Recorder Insertion;  Surgeon: Thompson Grayer, MD;  Location: [facility];  Service: Cardiovascular;  Laterality: N/A;  . PARS PLANA VITRECTOMY Right 09/22/2019   Procedure: PARS PLANA VITRECTOMY WITH 25 GAUGE RIGHT EYE;  Surgeon: Sherlynn Stalls, MD;  Location: [facility];  Service: Ophthalmology;  Laterality: Right;  . PHOTOCOAGULATION WITH LASER Right 09/22/2019   Procedure: PHOTOCOAGULATION WITH LASER RIGHT EYE;  Surgeon: Sherlynn Stalls, MD;  Location: [facility];  Service: Ophthalmology;  Laterality: Right;  . POLYPECTOMY  04/28/2019   Procedure: POLYPECTOMY;  Surgeon: Milus Banister, MD;  Location: [facility];  Service: Endoscopy;;  . RIGHT HEART CATHETERIZATION Right 06/06/2014   Procedure: RIGHT HEART CATH;  Surgeon: Jolaine Artist, MD;  Location[facility];  Service: Cardiovascular;  Laterality: Right;  . UVULOPALATOPHARYNGOPLASTY  1999    Family History  Problem Relation Age of Onset  . Heart disease Mother   . Heart attack Mother   . Hypertension Mother   . Kidney failure Mother   . Liver disease Mother         stage 4 liver disease  . Prostate cancer Father   . Cancer Father        lung  . Hypertension Brother   . Gout Brother   . Alcoholism Maternal Grandfather   . Liver disease Maternal Grandfather   . Migraines Sister   . Hypertension Brother   . Colon cancer Neg Hx     Allergies  Allergen Reactions  . Aspirin Hypertension  . Nitroglycerin Other (See Comments)    Severe Headache    Current Outpatient Medications on File Prior to Visit  Medication Sig Dispense Refill  . acetaminophen (TYLENOL) 500 MG tablet Take 1,000 mg by mouth every 6 (six) hours as needed (for pain.).    Marland Kitchen ADVAIR HFA 230-21 MCG/ACT inhaler USE 2 INHALATIONS EVERY MORNING 48 g 5  . allopurinol (ZYLOPRIM) 100 MG tablet Take 2 tablets (200 mg total) by mouth daily. 180 tablet 1  . Blood Pressure Monitoring (BLOOD PRESSURE CUFF) MISC Use as directed. Check blood pressure bid prn Dx:I10 1 each 0  . diphenoxylate-atropine (LOMOTIL) 2.5-0.025 MG tablet Take 1 tablet by mouth 4 (four) times daily as needed for diarrhea or loose stools. 40 tablet 1  . Ferrous Fumarate (HEMOCYTE) 324 (106 Fe) MG TABS tablet Take 1 tablet (106 mg of iron total) by mouth daily. 30 tablet 5  . folic acid (FOLVITE) 1 MG tablet Take 1 tablet (1 mg total) by mouth daily. 30 tablet 5  . hydrOXYzine (ATARAX/VISTARIL) 25 MG tablet Take 1 tablet (25 mg total) by mouth 3 (three) times daily as needed. 30 tablet 0  . hyoscyamine (LEVSIN SL) 0.125 MG SL tablet Place 1 tablet (0.125 mg total) under the tongue every 6 (six) hours as needed. 30 tablet 1  . macitentan (OPSUMIT) 10 MG tablet Take 1 tablet (10 mg total) by mouth daily. 90 tablet 3  . mometasone-formoterol (DULERA) 100-5 MCG/ACT AERO Inhale 2 puffs into the lungs 2 (two) times daily.    . Multiple Vitamin (MULTIVITAMIN) tablet Take 1 tablet by mouth daily.    . mupirocin ointment (BACTROBAN) 2 % Place 1 application into the nose at bedtime.  22 g 0  . omeprazole (PRILOSEC) 40 MG capsule TAKE 1  CAPSULE BY MOUTH ONCE DAILY 90 capsule 0  . Potassium Chloride ER 20 MEQ TBCR Take 80 mEq by mouth every morning AND 60 mEq every evening. 210 tablet 5  . rosuvastatin (CRESTOR) 5 MG tablet TAKE 1 TABLET DAILY 90 tablet 3  . Selexipag (UPTRAVI) 1000 MCG TABS Take 1,000 mcg by mouth 2 (two) times daily. 60 tablet 11  . tadalafil, PAH, (ADCIRCA) 20 MG tablet Take 2 tablets (40 mg total) by mouth daily. 180 tablet 1  . thiamine 100 MG tablet Take 1 tablet (100 mg total) by mouth daily. 30 tablet 5  . torsemide (DEMADEX) 20 MG tablet Take 3 tablets (60 mg total) by mouth 2 (two) times daily. 180 tablet 5  . traZODone (DESYREL) 50 MG tablet Take 0.5-1 tablets (25-50 mg total) by mouth at bedtime as needed for sleep. 30 tablet 0   No current facility-administered medications on file prior to visit.    BP 116/61 (BP Location: Left Arm, Patient Position: Sitting, Cuff Size: Large)   Pulse 82   Temp (!) 97.2 F (36.2 C) (Temporal)   Resp 18   Ht _0  (1.803 m)   Wt 224 lb (101.6 kg)   SpO2 100%   BMI 31.24 kg/m       Objective:   Physical Exam  General Mental Status- Alert. General Appearance- Not in acute distress.   Skin General: Color- Normal Color. Moisture- Normal Moisture.  Neck Carotid Arteries- Normal color. Moisture- Normal Moisture. No carotid bruits. No JVD.  Chest and Lung Exam Auscultation: Breath Sounds:-Normal.  Cardiovascular Auscultation:Rythm- Regular. Murmurs & Other Heart Sounds:Auscultation of the heart reveals- No Murmurs.  Abdomen Inspection:-Inspeection Normal. Palpation/Percussion:Note:No mass. Palpation and Percussion of the abdomen reveal- Non Tender, Non Distended + BS, no rebound or guarding.   Neurologic Cranial Nerve exam:- CN III-XII intact(No nystagmus), symmetric smile. Strength:- 5/5 equal and symmetric strength both upper and lower extremities.  Lower ext- 2-3 + edema. Swelling into ankles and feet.      Assessment & Plan:    History of chf, renal impairment, pancytopenia and recent shortness of breath. Recent 6 lb weight gain with increased sob. Last gfr and CR numbers worse. As we approach weekend/friday I do recommend ED evaluation to get stat real time cmp, cbc, bnp and chest xray to determine level of diuresis that may be needed. Consult with cardiologist may be needed.  I called down to ED today to inform them of your recent presentation. Please go down now and well follow your ED work up.  Follow up date to be determined based on ED recommendation.   30 minutes spent with pt today. Mackie Pai, PA-C

## 2020-01-20 NOTE — H&P (Signed)
History and Physical    DEONTAY Mueller HDQ:[id number] DOB: [birth year] DOA: 01/20/2020  PCP: Darren Lukes, MD  Patient coming from: Home  I have personally briefly reviewed patient's old medical records in [place]  Chief Complaint:  Shortness of breath increase lower extremity swelling   HPI: Darren Mueller is a 69 y.o. male with medical history significant of  PMH significant for portopulmonary hypertension with RV failure, and ETOH cirrhosis,Grade 1 and a small esophageal varices, CAD: R/L cath 6/17 noting minimal CAD with moderate PAH and preserved cardiac output.  Patient also has history of CKDIIIa, COPD,  HTN, HLD, and pancytopenia who presents to the ED in referral from pcp due concern for CHF exacerbation with  history of worsening shortness of breath, and increase lower extremity swelling. Patient notes associated weight gain of around 6lb over the last one week. He also notes dry cough. He denies any fevers or chills, abdominal pain, nausea vomiting, productive cough, numbness or weakness. He does note some mild fleeting chest pressure with exertion. States he had this in ED at [facility] point but that is resolved on its own. He notes he has been compliant with his medications.   ED Course: BP 122/78 (BP Location: Right Arm)   Pulse 82   Temp 97.9 F (36.6 C) (Oral)   Resp (!) 22   Ht 6' (1.829 m)   Wt 101.6 kg   SpO2 100%   BMI 30.38 kg/m    bnp :506  Dry BNP  158 Cr 2.8  Base  2-3 Hgb: 8.8 stable  From one week ago  Wbc:1.8  Rand 1.5-2.4  Over the last month  CE: 34, chronic leak JJH:ERDEY with more prominent diffuse twave flatening and inversions, as well as more prominent st depression from prior leads v1-v3 Cxr: Cardiomegaly with mild central vascular congestion  Treatment: Iv lasix 80 mg    Review of Systems: As per HPI otherwise 10 point review of systems negative.   Past Medical History:  Diagnosis Date  . Alcohol dependence ([place]) 03/22/2011   In remission   . Alcohol dependence in remission ([place]) 03/22/2011   In remission   . Alcoholic cirrhosis of liver with ascites ([place]) 2014  . Anginal pain ([place])    with SOB admitted 04/20/2019  . Cardiomegaly 02/2019   Noted on CXR  . CHF (congestive heart failure) ([place])   . CKD (chronic kidney disease), stage III 07/18/2013  . COPD (chronic obstructive pulmonary disease) ([place])   . Coronary artery disease   . Dermatitis 06/10/2015  . Diarrhea 09/04/2013  . Dyspnea    admitted 04/20/2019  . Elbow pain, left 03/17/2017  . Eustachian tube dysfunction   . GERD (gastroesophageal reflux disease)   . Gout   . Headache   . Hearing loss   . Heart murmur   . Hx of colonic polyps   . Hypertension   . Hypertriglyceridemia 03/17/2017  . Hypopotassemia   . Otalgia 11/28/2013  . Other chronic pulmonary heart diseases   . PAH (pulmonary artery hypertension) (H[facility])    RV failure  . Pancytopenia ([place])   . Pleural effusion 2014   Noted on CT:   Moderately large simple layering right pleural effusion   . Preventative health care 06/29/2016  . Pulmonary nodule 2014   Noted on CT: An 8 mm pulmonary nodule in the periphery of the left lower lobe  . Rectal bleeding   . Red eye 03/03/2016   right  .  Seizures ([place])   . Sleep apnea    does not wear CPAP  . Splenomegaly 12/2015   Noted on CT  . Thoracic aorta atherosclerosis ([place]) 12/2017  . Thrombocytopenia ([place]) 06/29/2016  . Unspecified hypothyroidism 01/25/2013  . Unspecified pleural effusion   . Vertigo   . Wears glasses     Past Surgical History:  Procedure Laterality Date  . CARDIAC CATHETERIZATION N/A 12/21/2015   Procedure: Right Heart Cath;  Surgeon: Darren Artist, MD;  Location: [place] CV [facility];  Service: Cardiovascular;  Laterality: N/A;  . CARDIAC CATHETERIZATION N/A 04/15/2016   Procedure: Right/Left Heart Cath and Coronary Angiography;  Surgeon: Darren Artist, MD;  Location: [place] [facility] LAB;  Service: Cardiovascular;   Laterality: N/A;  . COLONOSCOPY N/A 12/08/2013   Procedure: COLONOSCOPY;  Surgeon: Darren Banister, MD;  Location: [facility]OSCOPY;  Service: Endoscopy;  Laterality: N/A;  . COLONOSCOPY WITH PROPOFOL N/A 03/24/2019   Procedure: COLONOSCOPY WITH PROPOFOL;  Surgeon: Darren Banister, MD;  Location: [facility];  Service: Endoscopy;  Laterality: N/A;  Draw CBC in preop  . COLONOSCOPY WITH PROPOFOL N/A 04/28/2019   Procedure: COLONOSCOPY WITH PROPOFOL;  Surgeon: Darren Banister, MD;  Location: [facility];  Service: Endoscopy;  Laterality: N/A;  . ESOPHAGOGASTRODUODENOSCOPY (EGD) WITH PROPOFOL N/A 03/27/2016   Procedure: ESOPHAGOGASTRODUODENOSCOPY (EGD) WITH PROPOFOL;  Surgeon: Darren Banister, MD;  Location: [place];  Service: Endoscopy;  Laterality: N/A;  . ESOPHAGOGASTRODUODENOSCOPY (EGD) WITH PROPOFOL N/A 02/14/2019   Procedure: ESOPHAGOGASTRODUODENOSCOPY (EGD) WITH PROPOFOL;  Surgeon: Darren Bears, MD;  Location: [facility] ENDOSCOPY;  Service: Gastroenterology;  Laterality: N/A;  . EYE SURGERY Bilateral 03/15/2019   lasix  . GAS/FLUID EXCHANGE Right 09/22/2019   Procedure: GAS/FLUID EXCHANGE (C3F8) RIGHT EYE;  Surgeon: Darren Stalls, MD;  Location: [place];  Service: Ophthalmology;  Laterality: Right;  . HEMOSTASIS CLIP PLACEMENT  04/28/2019   Procedure: HEMOSTASIS CLIP PLACEMENT;  Surgeon: Darren Banister, MD;  Location: [facility];  Service: Endoscopy;;  . LOOP RECORDER INSERTION N/A 01/01/2017   Procedure: Loop Recorder Insertion;  Surgeon: Darren Grayer, MD;  Location: [place] CV LAB;  Service: Cardiovascular;  Laterality: N/A;  . PARS PLANA VITRECTOMY Right 09/22/2019   Procedure: PARS PLANA VITRECTOMY WITH 25 GAUGE RIGHT EYE;  Surgeon: Darren Stalls, MD;  Location: [place];  Service: Ophthalmology;  Laterality: Right;  . PHOTOCOAGULATION WITH LASER Right 09/22/2019   Procedure: PHOTOCOAGULATION WITH LASER RIGHT EYE;  Surgeon: Darren Stalls, MD;  Location: [place];  Service: Ophthalmology;   Laterality: Right;  . POLYPECTOMY  04/28/2019   Procedure: POLYPECTOMY;  Surgeon: Darren Banister, MD;  Location: [facility];  Service: Endoscopy;;  . RIGHT HEART CATHETERIZATION Right 06/06/2014   Procedure: RIGHT HEART CATH;  Surgeon: Darren Artist, MD;  Location[facility] CATH LAB;  Service: Cardiovascular;  Laterality: Right;  . UVULOPALATOPHARYNGOPLASTY  1999     reports that he quit smoking about 40 years ago. His smoking use included cigarettes. He has a 10.00 pack-year smoking history. He has never used smokeless tobacco. He reports current alcohol use of about 6.0 standard drinks of alcohol per week. He reports that he does not use drugs.  Allergies  Allergen Reactions  . Aspirin Hypertension  . Nitroglycerin Other (See Comments)    Severe Headache    Family History  Problem Relation Age of Onset  . Heart disease Mother   . Heart attack Mother   . Hypertension Mother   . Kidney failure Mother   .  Liver disease Mother        stage 4 liver disease  . Prostate cancer Father   . Cancer Father        lung  . Hypertension Brother   . Gout Brother   . Alcoholism Maternal Grandfather   . Liver disease Maternal Grandfather   . Migraines Sister   . Hypertension Brother   . Colon cancer Neg Hx     Prior to Admission medications   Medication Sig Start Date End Date Taking? Authorizing Provider  acetaminophen (TYLENOL) 500 MG tablet Take 1,000 mg by mouth every 6 (six) hours as needed (for pain.).    [provider]  ADVAIR [facility] [phone] MCG/ACT inhaler USE 2 INHALATIONS EVERY MORNING 11/04/19   Darren Lukes, MD  allopurinol (ZYLOPRIM) 100 MG tablet Take 2 tablets (200 mg total) by mouth daily. 07/12/19   Darren Lukes, MD  Blood Pressure Monitoring (BLOOD PRESSURE CUFF) MISC Use as directed. Check blood pressure bid prn Dx:I10 02/22/19   Darren Lukes, MD  diphenoxylate-atropine (LOMOTIL) 2.5-0.025 MG tablet Take 1 tablet by mouth 4 (four) times daily as needed for  diarrhea or loose stools. 12/01/19   Darren Lukes, MD  Ferrous Fumarate (HEMOCYTE) 324 (106 Fe) MG TABS tablet Take 1 tablet (106 mg of iron total) by mouth daily. 05/05/19   Darren Lukes, MD  folic acid (FOLVITE) 1 MG tablet Take 1 tablet (1 mg total) by mouth daily. 01/11/20   Saguier, Percell Miller, PA-C  hydrOXYzine (ATARAX/VISTARIL) 25 MG tablet Take 1 tablet (25 mg total) by mouth 3 (three) times daily as needed. 08/30/19   Darren Lukes, MD  hyoscyamine (LEVSIN SL) 0.125 MG SL tablet Place 1 tablet (0.125 mg total) under the tongue every 6 (six) hours as needed. 08/23/19   Darren Lukes, MD  macitentan (OPSUMIT) 10 MG tablet Take 1 tablet (10 mg total) by mouth daily. 09/26/19   Bensimhon, Shaune Pascal, MD  mometasone-formoterol (DULERA) 100-5 MCG/ACT AERO Inhale 2 puffs into the lungs 2 (two) times daily.    [provider]  Multiple Vitamin (MULTIVITAMIN) tablet Take 1 tablet by mouth daily.    [provider]  mupirocin ointment (BACTROBAN) 2 % Place 1 application into the nose at bedtime. 12/01/19   Darren Lukes, MD  omeprazole (PRILOSEC) 40 MG capsule TAKE 1 CAPSULE BY MOUTH ONCE DAILY 11/14/19   Saguier, Percell Miller, PA-C  Potassium Chloride ER 20 MEQ TBCR Take 80 mEq by mouth every morning AND 60 mEq every evening. 12/06/19   Bensimhon, Shaune Pascal, MD  rosuvastatin (CRESTOR) 5 MG tablet TAKE 1 TABLET DAILY 08/31/19   Darren Lukes, MD  Selexipag (UPTRAVI) 1000 MCG TABS Take 1,000 mcg by mouth 2 (two) times daily. 06/03/19   Bensimhon, Shaune Pascal, MD  tadalafil, PAH, (ADCIRCA) 20 MG tablet Take 2 tablets (40 mg total) by mouth daily. 11/18/19   Bensimhon, Shaune Pascal, MD  thiamine 100 MG tablet Take 1 tablet (100 mg total) by mouth daily. 05/05/19   Darren Lukes, MD  torsemide (DEMADEX) 20 MG tablet Take 3 tablets (60 mg total) by mouth 2 (two) times daily. 11/11/19   Bensimhon, Shaune Pascal, MD  traZODone (DESYREL) 50 MG tablet Take 0.5-1 tablets (25-50 mg total) by mouth at bedtime as  needed for sleep. 01/11/20   Mackie Pai, PA-C    Physical Exam: Vitals:   01/20/20 1600 01/20/20 1644 01/20/20 1822 01/20/20 1931  BP: 126/77 130/73 122/78 125/80  Pulse: 74 82 82 80  Resp: 20 20 (!) 22 20  Temp:      TempSrc:      SpO2: 100% 100% 100% 100%  Weight:      Height:        Constitutional: NAD, calm, comfortable Vitals:   01/20/20 1600 01/20/20 1644 01/20/20 1822 01/20/20 1931  BP: 126/77 130/73 122/78 125/80  Pulse: 74 82 82 80  Resp: 20 20 (!) 22 20  Temp:      TempSrc:      SpO2: 100% 100% 100% 100%  Weight:      Height:       Eyes: PERRL, lids and conjunctivae normal ENMT: Mucous membranes are moist. Posterior pharynx clear of any exudate or lesions.Normal dentition.  Neck: normal, supple, no masses, no thyromegaly Respiratory: no wheezing, decrease at base more so on the left. Normal respiratory effort. No accessory muscle use.  Cardiovascular: Regular rate and rhythm, + murmurs / rubs / gallops. +2-3 edema extremity edema. Abdomen: no tenderness, no masses palpated. No hepatosplenomegaly. Bowel sounds positive.  Musculoskeletal: no clubbing / cyanosis. No joint deformity upper and lower extremities. Good ROM, no contractures. Normal muscle tone.  Skin: no rashes, lesions, ulcers. No induration Neurologic: CN 2-12 grossly intact. Sensation intact, Strength 5/5 in all 4.  Psychiatric: Normal judgment and insight. Alert and oriented x 3. Normal mood.    Labs on Admission: I have personally reviewed following labs and imaging studies  CBC: Recent Labs  Lab 01/20/20 1503  WBC 1.8*  NEUTROABS 1.0*  HGB 8.8*  HCT 29.0*  MCV 107.4*  PLT 30*   Basic Metabolic Panel: Recent Labs  Lab 01/16/20 1524 01/20/20 1503  NA 135 135  K 3.4* 3.5  CL 103 105  CO2 23 22  GLUCOSE 113* 112*  BUN 65* 59*  CREATININE 3.33* 2.80*  CALCIUM 8.5 8.4*   GFR: Estimated Creatinine Clearance: 30.7 mL/min (A) (by C-G formula based on SCr of 2.8 mg/dL (H)). Liver  Function Tests: Recent Labs  Lab 01/16/20 1524 01/20/20 1503  AST 36 45*  ALT 9 12  ALKPHOS 448* 418*  BILITOT 2.0* 1.9*  PROT 7.3 7.1  ALBUMIN 3.2* 3.0*   No results for input(s): LIPASE, AMYLASE in the last 168 hours. No results for input(s): AMMONIA in the last 168 hours. Coagulation Profile: No results for input(s): INR, PROTIME in the last 168 hours. Cardiac Enzymes: No results for input(s): CKTOTAL, CKMB, CKMBINDEX, TROPONINI in the last 168 hours. BNP (last 3 results) Recent Labs    10/11/19 1138 12/08/19 1041 01/11/20 1439  PROBNP 604.0* 710.0* 494.0*   HbA1C: No results for input(s): HGBA1C in the last 72 hours. CBG: No results for input(s): GLUCAP in the last 168 hours. Lipid Profile: No results for input(s): CHOL, HDL, LDLCALC, TRIG, CHOLHDL, LDLDIRECT in the last 72 hours. Thyroid Function Tests: No results for input(s): TSH, T4TOTAL, FREET4, T3FREE, THYROIDAB in the last 72 hours. Anemia Panel: No results for input(s): VITAMINB12, FOLATE, FERRITIN, TIBC, IRON, RETICCTPCT in the last 72 hours. Urine analysis:    Component Value Date/Time   COLORURINE YELLOW 11/18/2017 0250   APPEARANCEUR CLEAR 11/18/2017 0250   LABSPEC 1.008 11/18/2017 0250   PHURINE 5.0 11/18/2017 0250   GLUCOSEU NEGATIVE 11/18/2017 0250   HGBUR NEGATIVE 11/18/2017 0250   BILIRUBINUR neg 12/18/2017 1352   KETONESUR NEGATIVE 11/18/2017 0250   PROTEINUR net 12/18/2017 1352   PROTEINUR NEGATIVE 11/18/2017 0250   UROBILINOGEN 0.2 12/18/2017 1352  UROBILINOGEN 0.2 10/18/2013 1247   NITRITE neg 12/18/2017 1352   NITRITE NEGATIVE 11/18/2017 0250   LEUKOCYTESUR Negative 12/18/2017 1352    Radiological Exams on Admission: DG Chest Port 1 View  Result Date: 01/20/2020 CLINICAL DATA:  Shortness of breath and chest pain EXAM: PORTABLE CHEST 1 VIEW COMPARISON:  01/11/2020, 12/08/2019 FINDINGS: Cardiomegaly with mild central congestion. No consolidation, pleural effusion or pneumothorax.  Recording device over the left lower chest. IMPRESSION: Cardiomegaly with mild central vascular congestion Electronically Signed   By: Donavan Foil M.D.   On: 01/20/2020 15:01    EKG: see above  Assessment/Plan  Acute on Chronic diastolic/Right sided Heart failure   -place on CHF protocol    - iv lasix bid   -strict I/o/daily wts/echo in am -(7/20)LVEF 65-70%, moderate LVH, normal wall motion, grade 1 DD,  indeterminate LV filling pressure, moderately dilated RV with septal  flattening (D-sign) suggestive of volume and pressure overload of  the RV, moderate TR, RVSP 79 mmHg (severe pulmonary hypertension),  severe RAE, normal LA size, dilated IVC that does not collapse  -cardiology consult    Abnormal CE / ? Change on EKG -doubt acs/ more likely due to demand ischemia  -cycle ce  -repeat ekg in am     PAH: Suspected portopulmonary HTN in setting of ETOH cirrhosis. - Continue selexipag ,macitentan, adcirca   CKD IIIb:  -trending toward Baseline creatinine of low 2 now 2.8 improved from 3.3 -due to poor flow from CHF    ETOH/Cirrhosis: -no active issues  -history of grade 1 varices  -h/h stable   7. Recurrent syncope/seizure - LINQ interrogations have shown no arrhythmias and thus most likely seizure - Continue Keppra. Follows with Dr. Delice Lesch   OSA:  -O2 qhs  -not able tolerate cpap    Pancytopenia:  -due  splenic sequestration.  -counts stable   DVT prophylaxis: scd  Code Status: FULL  Family Communication: N/A Disposition Plan: 3-5 days  Consults called: cardiology consult in am  Admission status:inpatient    Clance Boll MD [facility]  If 7PM-7AM, please contact night-coverage [email or web address] Password [facility]  01/20/2020, 7:40 PM

## 2020-01-21 ENCOUNTER — Inpatient Hospital Stay (HOSPITAL_COMMUNITY): Payer: Medicare Other

## 2020-01-21 DIAGNOSIS — I50810 Right heart failure, unspecified: Secondary | ICD-10-CM

## 2020-01-21 DIAGNOSIS — I361 Nonrheumatic tricuspid (valve) insufficiency: Secondary | ICD-10-CM

## 2020-01-21 DIAGNOSIS — I351 Nonrheumatic aortic (valve) insufficiency: Secondary | ICD-10-CM

## 2020-01-21 DIAGNOSIS — I272 Pulmonary hypertension, unspecified: Secondary | ICD-10-CM

## 2020-01-21 LAB — FOLATE: Folate: 46.8 ng/mL (ref >5.9)

## 2020-01-21 LAB — IRON AND TIBC
Iron: 54 ug/dL (ref 45–182)
Saturation Ratios: 16 % — ABNORMAL LOW (ref 17.9–39.5)
TIBC: 332 ug/dL (ref 250–450)
UIBC: 278 ug/dL

## 2020-01-21 LAB — SARS CORONAVIRUS 2 (TAT 6-24 HRS): SARS Coronavirus 2: NEGATIVE

## 2020-01-21 LAB — ECHOCARDIOGRAM COMPLETE
Height: 72 in
Weight: 3409.6 oz

## 2020-01-21 LAB — RETICULOCYTES
Immature Retic Fract: 17.4 % — ABNORMAL HIGH (ref 2.3–15.9)
RBC.: 2.55 MIL/uL — ABNORMAL LOW (ref 4.22–5.81)
Retic Count, Absolute: 46.2 10*3/uL (ref 19.0–186.0)
Retic Ct Pct: 1.8 % (ref 0.4–3.1)

## 2020-01-21 LAB — VITAMIN B12: Vitamin B-12: 618 pg/mL (ref 180–914)

## 2020-01-21 LAB — MAGNESIUM: Magnesium: 1.7 mg/dL (ref 1.7–2.4)

## 2020-01-21 LAB — FERRITIN: Ferritin: 25 ng/mL (ref 24–336)

## 2020-01-21 MED ORDER — FUROSEMIDE 10 MG/ML IJ SOLN
80.0000 mg | Freq: Two times a day (BID) | INTRAMUSCULAR | Status: DC
  Administered 2020-01-21 – 2020-01-23 (×5): 80 mg via INTRAVENOUS
  Filled 2020-01-21 (×5): qty 8

## 2020-01-21 MED ORDER — MAGNESIUM SULFATE 2 GM/50ML IV SOLN
2.0000 g | Freq: Once | INTRAVENOUS | Status: AC
  Administered 2020-01-21: 17:00:00 2 g via INTRAVENOUS
  Filled 2020-01-21: qty 50

## 2020-01-21 MED ORDER — POTASSIUM CHLORIDE CRYS ER 20 MEQ PO TBCR
40.0000 meq | EXTENDED_RELEASE_TABLET | Freq: Once | ORAL | Status: AC
  Administered 2020-01-21: 08:00:00 40 meq via ORAL
  Filled 2020-01-21: qty 2

## 2020-01-21 MED ORDER — TRAZODONE HCL 50 MG PO TABS
50.0000 mg | ORAL_TABLET | Freq: Every evening | ORAL | Status: DC | PRN
  Administered 2020-01-21: 22:00:00 50 mg via ORAL
  Filled 2020-01-21: qty 1

## 2020-01-21 MED ORDER — ADULT MULTIVITAMIN W/MINERALS CH
1.0000 | ORAL_TABLET | Freq: Every day | ORAL | Status: DC
  Administered 2020-01-21 – 2020-01-26 (×6): 1 via ORAL
  Filled 2020-01-21 (×6): qty 1

## 2020-01-21 NOTE — Evaluation (Signed)
Physical Therapy Evaluation Patient Details Name: Darren Mueller MRN: [id number] DOB: Dec 08, 1950 Today's Date: 01/21/2020   History of Present Illness  69 y.o. male with PMH significant for portopulmonary hypertension with RV failure, ETOH cirrhosis,Grade 1, small esophageal varices, CAD: R/L cath 6/17 noting minimal CAD with moderate PAH and preserved cardiac output, CKDIIIa, COPD,  HTN, HLD, vertigo, seizures, gout. herat murmur and pancytopenia who presents to the ED in referral from pcp due concern for CHF exacerbation with  history of worsening shortness of breath, and increase lower extremity swelling. Patient notes associated weight gain of around 6lb over the last one week and dry cough.   Clinical Impression   Pt admitted with above diagnosis. PTA was living home with son, reports was independent and did not use nor own any DME/AD.  Pt currently with functional limitations due to the deficits listed below (see PT Problem List). Arrived in room to find pt ambulating in room, pt did well with mobility, did show some deficits in balance and coordination, also activity tolerance. He denied any current pain. Pt will benefit from skilled PT to increase his overall balance and coordination, activity tolerance, independence and safety with mobility to allow discharge to the venue listed below.       Follow Up Recommendations No PT follow up    Equipment Recommendations  None recommended by PT    Recommendations for Other Services       Precautions / Restrictions Precautions Precautions: None Restrictions Weight Bearing Restrictions: No      Mobility  Bed Mobility Overal bed mobility: Modified Independent             General bed mobility comments: HOB elevated but no assist required to EOB   Transfers Overall transfer level: Modified independent               General transfer comment: sit to stand at EOB  Ambulation/Gait Ambulation/Gait assistance: Modified  independent (Device/Increase time) Gait Distance (Feet): 30 Feet Assistive device: None Gait Pattern/deviations: Wide base of support;Step-through pattern Gait velocity: dec   General Gait Details: arrived in room to find pt ambulating in room, reports has just been to rest room and is returning to bed  Stairs            Wheelchair Mobility    Modified Rankin (Stroke Patients Only)       Balance Overall balance assessment: Mild deficits observed, not formally tested                                           Pertinent Vitals/Pain Pain Assessment: No/denies pain    Home Living Family/patient expects to be discharged to:: Private residence Living Arrangements: Children Available Help at Discharge: Family;Available 24 hours/day Type of Home: House Home Access: Stairs to enter Entrance Stairs-Rails: Can reach both Entrance Stairs-Number of Steps: 2 Home Layout: One level Home Equipment: None      Prior Function Level of Independence: Independent         Comments: independent w/ all son states needs some assist w/ lifting >20lbs     Hand Dominance   Dominant Hand: Right    Extremity/Trunk Assessment   Upper Extremity Assessment Upper Extremity Assessment: Overall WFL for tasks assessed    Lower Extremity Assessment Lower Extremity Assessment: Overall WFL for tasks assessed    Cervical / Trunk Assessment Cervical / Trunk  Assessment: Normal  Communication   Communication: No difficulties  Cognition Arousal/Alertness: Awake/alert Behavior During Therapy: WFL for tasks assessed/performed Overall Cognitive Status: Within Functional Limits for tasks assessed                                 General Comments: son in room and states pt at baseline      General Comments General comments (skin integrity, edema, etc.): on room air and denies pain    Exercises     Assessment/Plan    PT Assessment Patient needs continued  PT services  PT Problem List Decreased range of motion;Decreased activity tolerance;Decreased balance;Decreased mobility;Decreased coordination       PT Treatment Interventions Gait training;Stair training;Functional mobility training;Therapeutic activities;Therapeutic exercise;Balance training;Neuromuscular re-education    PT Goals (Current goals can be found in the Care Plan section)  Acute Rehab PT Goals Patient Stated Goal: get home PT Goal Formulation: With patient Time For Goal Achievement: 02/04/20 Potential to Achieve Goals: Good    Frequency Min 3X/week   Barriers to discharge        Co-evaluation               AM-PAC PT "6 Clicks" Mobility  Outcome Measure Help needed turning from your back to your side while in a flat bed without using bedrails?: None Help needed moving from lying on your back to sitting on the side of a flat bed without using bedrails?: None Help needed moving to and from a bed to a chair (including a wheelchair)?: None Help needed standing up from a chair using your arms (e.g., wheelchair or bedside chair)?: None Help needed to walk in hospital room?: A Little Help needed climbing 3-5 steps with a railing? : A Little 6 Click Score: 22    End of Session   Activity Tolerance: Patient tolerated treatment well Patient left: in bed;with call bell/phone within reach;with family/visitor present Nurse Communication: Mobility status PT Visit Diagnosis: Unsteadiness on feet (R26.81);Other abnormalities of gait and mobility (R26.89)    Time: 2376-2831 PT Time Calculation (min) (ACUTE ONLY): 11 min   Charges:   PT Evaluation $PT Eval Low Complexity: [place], PT   Delford Field 01/21/2020, 1:56 PM

## 2020-01-21 NOTE — Progress Notes (Signed)
PROGRESS NOTE  Darren Mueller:[id number] DOB: 28-Jun-1951   PCP: [name], MD  Patient is from: Home.  Independently ambulates at baseline.  DOA: 01/20/2020 LOS: 1  Brief Narrative / Interim history: 69 year old male with history of RV failure/severe P HTN, EtOH cirrhosis, esophageal varices, minimal CADon R/LHC, CKD-3A, COPD, HTN and pancytopenia presented to ED from PCP office with CHF exacerbation.  Heart shortness of breath, increased edema and about 6 pounds weight gain in 1 week.  In ED, HDS and normal saturation on RA.  BNP 506 (dry BNP 158). Cr 2.8 (b/l 2-3).  Hgb 8.8 (baseline).  WBC 1.8 (about baseline). HS trop 33.  EKG sinus rhythm with diffuse T wave changes.  CXR with cardiomegaly and central vascular congestion.  Received IV Lasix 80 mg once, and admitted for CHF exacerbation.  Cardiology (advanced heart failure team) consulted.  Subjective: Seen and examined earlier this morning.  He says he has been up all night urinating.  Feels tired this morning.  Reports some improvement in his breathing and the swelling.  Denies chest pain, GI or UTI symptoms.  He denies melena or hematochezia.  Objective: Vitals:   01/20/20 2300 01/21/20 0512 01/21/20 0809 01/21/20 0908  BP:  107/61 108/84   Pulse:  82 78   Resp:  18 20   Temp:  98 F (36.7 C)    TempSrc:  Oral    SpO2: 100% 99% 99% 97%  Weight:  96.7 kg    Height:        Intake/Output Summary (Last 24 hours) at 01/21/2020 1226 Last data filed at 01/21/2020 1202 Gross per 24 hour  Intake 840 ml  Output 2125 ml  Net -1285 ml   Filed Weights   01/20/20 1426 01/21/20 0512  Weight: 101.6 kg 96.7 kg    Examination:  GENERAL: No acute distress.  Appears well.  HEENT: MMM.  Vision and hearing grossly intact.  NECK: Supple.  Prominent JVD RESP: On room air.  No IWOB.  Bilateral crackles CVS:  RRR. Heart sounds normal.  ABD/GI/GU: Bowel sounds present. Soft. Non tender.  MSK/EXT:  Moves extremities. No  apparent deformity.  2-3 pitting edema bilaterally SKIN: no apparent skin lesion or wound NEURO: Awake, alert and oriented appropriately.  No apparent focal neuro deficit. PSYCH: Calm. Normal affect.  Procedures:  None  Assessment & Plan: Acute on Chronic diastolic/RV failure-Echo in 05/2019 with EF of 65 to 70%, moderate LVH, normal wall motion, G1 DD, moderately dilated RV with DD sign, moderate TR, severe RAE, dilated IVC and RVSP of 79 mmHg.  RRR/LHC in 2017 with minimal CAD and moderate P HTN.  Reports good compliance with medication, sodium and fluid restriction.  Denies NSAID use.  Occasional alcohol.  Denies substance use.  BNP higher than baseline.  CXR consistent with CHF.  Responding to IV Lasix.  About 1.6 L UOP charted overnight.  Renal function stable. -Advanced HF team managing-continue IV Lasix 80 mg twice daily -Monitor fluid status, renal function and electrolytes -Follow repeat echo -Sodium and fluid restrictions  Elevated troponin due to demand ischemia in the setting of CHF exacerbation: Pattern not consistent with ACS.  No chest pain. -Manage CHF as above.   Severe PAH: RVSP of 79 mmHg on echo in 05/2019 -Continue home PH meds (Selexipag, tadalafil, and Opsumit) per cardiology  AKI on CKD IIIb: Cr 2.13 on 3/10> 3.33 on 3/15> 2.80.  BUN in 60s.  Suspect cardiorenal syndrome in the setting of CHF.  -  Diuretics as above -Continue monitoring  ETOH Cirrhosis/portal gastropathy/varices:Stable. -Diuretics as above  History of seizure? - Continue Keppra. Follows with Dr. Karel Jarvis  Chronic COPD: Stable. -Continue home breathing treatments   OSA not on CPAP: Was not able to tolerate CPAP -O2 qhs    Pancytopenia: Chronic.  Likely due to cirrhosis and splenic sequestration -Continue monitoring -Continue Hemocyte and folic acid  Hypokalemia/hypomagnesemia-likely due to diuretics -Replenish and recheck.                DVT prophylaxis: SCD in the setting  of thrombocytopenia Code Status: Full code Family Communication: Patient and/or RN. Available if any question.  Discharge barrier: Evaluation and treatment of acute CHF requiring IV diuretics and close monitoring on telemetry Patient is from: Home Final disposition: Likely home in the next 4 to 5 days when stable and cleared by cardiology  Consultants: Advanced HF team   Microbiology summarized: COVID-19 negative.  Sch Meds:  Scheduled Meds: . allopurinol  200 mg Oral Daily  . Ferrous Fumarate  1 tablet Oral Daily  . folic acid  1 mg Oral Daily  . furosemide  80 mg Intravenous BID  . macitentan  10 mg Oral Daily  . mometasone-formoterol  2 puff Inhalation BID  . multivitamin with minerals  1 tablet Oral Daily  . pantoprazole  40 mg Oral Daily  . rosuvastatin  5 mg Oral Daily  . Selexipag  1,000 mcg Oral BID  . sodium chloride flush  3 mL Intravenous Q12H  . tadalafil  40 mg Oral Daily  . thiamine  100 mg Oral Daily   Continuous Infusions: . sodium chloride     PRN Meds:.sodium chloride, acetaminophen, albuterol, hydrOXYzine, ondansetron (ZOFRAN) IV, sodium chloride flush  Antimicrobials: Anti-infectives (From admission, onward)   None       I have personally reviewed the following labs and images: CBC: Recent Labs  Lab 01/20/20 1503  WBC 1.8*  NEUTROABS 1.0*  HGB 8.8*  HCT 29.0*  MCV 107.4*  PLT 30*   BMP &GFR Recent Labs  Lab 01/16/20 1524 01/20/20 1503 01/21/20 0841  NA 135 135  --   K 3.4* 3.5  --   CL 103 105  --   CO2 23 22  --   GLUCOSE 113* 112*  --   BUN 65* 59*  --   CREATININE 3.33* 2.80*  --   CALCIUM 8.5 8.4*  --   MG  --   --  1.7   Estimated Creatinine Clearance: 30 mL/min (A) (by C-G formula based on SCr of 2.8 mg/dL (H)). Liver & Pancreas: Recent Labs  Lab 01/16/20 1524 01/20/20 1503  AST 36 45*  ALT 9 12  ALKPHOS 448* 418*  BILITOT 2.0* 1.9*  PROT 7.3 7.1  ALBUMIN 3.2* 3.0*   No results for input(s): LIPASE, AMYLASE  in the last 168 hours. No results for input(s): AMMONIA in the last 168 hours. Diabetic: No results for input(s): HGBA1C in the last 72 hours. No results for input(s): GLUCAP in the last 168 hours. Cardiac Enzymes: No results for input(s): CKTOTAL, CKMB, CKMBINDEX, TROPONINI in the last 168 hours. Recent Labs    10/11/19 1138 12/08/19 1041 01/11/20 1439  PROBNP 604.0* 710.0* 494.0*   Coagulation Profile: No results for input(s): INR, PROTIME in the last 168 hours. Thyroid Function Tests: No results for input(s): TSH, T4TOTAL, FREET4, T3FREE, THYROIDAB in the last 72 hours. Lipid Profile: No results for input(s): CHOL, HDL, LDLCALC, TRIG, CHOLHDL, LDLDIRECT in the  last 72 hours. Anemia Panel: Recent Labs    01/21/20 0841  VITAMINB12 618  FOLATE 46.8  FERRITIN 25  TIBC 332  IRON 54  RETICCTPCT 1.8   Urine analysis:    Component Value Date/Time   COLORURINE YELLOW 11/18/2017 0250   APPEARANCEUR CLEAR 11/18/2017 0250   LABSPEC 1.008 11/18/2017 0250   PHURINE 5.0 11/18/2017 0250   GLUCOSEU NEGATIVE 11/18/2017 0250   HGBUR NEGATIVE 11/18/2017 0250   BILIRUBINUR neg 12/18/2017 1352   KETONESUR NEGATIVE 11/18/2017 0250   PROTEINUR net 12/18/2017 1352   PROTEINUR NEGATIVE 11/18/2017 0250   UROBILINOGEN 0.2 12/18/2017 1352   UROBILINOGEN 0.2 10/18/2013 1247   NITRITE neg 12/18/2017 1352   NITRITE NEGATIVE 11/18/2017 0250   LEUKOCYTESUR Negative 12/18/2017 1352   Sepsis Labs: Invalid input(s): PROCALCITONIN, LACTICIDVEN  Microbiology: Recent Results (from the past 240 hour(s))  SARS CORONAVIRUS 2 (TAT 6-24 HRS) Nasopharyngeal Nasopharyngeal Swab     Status: None   Collection Time: 01/20/20  7:29 PM   Specimen: Nasopharyngeal Swab  Result Value Ref Range Status   SARS Coronavirus 2 NEGATIVE NEGATIVE Final    Comment: (NOTE) SARS-CoV-2 target nucleic acids are NOT DETECTED. The SARS-CoV-2 RNA is generally detectable in upper and lower respiratory specimens during the  acute phase of infection. Negative results do not preclude SARS-CoV-2 infection, do not rule out co-infections with other pathogens, and should not be used as the sole basis for treatment or other patient management decisions. Negative results must be combined with clinical observations, patient history, and epidemiological information. The expected result is Negative. Fact Sheet for Patients: HairSlick.no Fact Sheet for Healthcare Providers: quierodirigir.com This test is not yet approved or cleared by the [place] FDA and  has been authorized for detection and/or diagnosis of SARS-CoV-2 by FDA under an Emergency Use Authorization (EUA). This EUA will remain  in effect (meaning this test can be used) for the duration of the COVID-19 declaration under Section 56 4(b)(1) of the Act, 21 U.S.C. section 360bbb-3(b)(1), unless the authorization is terminated or revoked sooner. Performed at [facility], [address], [place], [place] [address]     Radiology Studies: DG Chest Port 1 View  Result Date: 01/20/2020 CLINICAL DATA:  Shortness of breath and chest pain EXAM: PORTABLE CHEST 1 VIEW COMPARISON:  01/11/2020, 12/08/2019 FINDINGS: Cardiomegaly with mild central congestion. No consolidation, pleural effusion or pneumothorax. Recording device over the left lower chest. IMPRESSION: Cardiomegaly with mild central vascular congestion Electronically Signed   By: Jasmine Pang M.D.   On: 01/20/2020 15:01   45 minutes with more than 50% spent in reviewing records, counseling patient/family and coordinating care.    Hlee Fringer T. Rejoice [facility]  If 7PM-7AM, please contact night-coverage [email or web address] Password [facility] 01/21/2020, 12:26 PM

## 2020-01-21 NOTE — Progress Notes (Signed)
Patient could not tolerate a full face mask PTSD.

## 2020-01-21 NOTE — Progress Notes (Signed)
  Echocardiogram 2D Echocardiogram has been performed.  Darren Mueller 01/21/2020, 3:59 PM

## 2020-01-21 NOTE — Consult Note (Addendum)
Cardiology Consultation:   Patient ID: Darren Mueller; [id number]; 02/26/1951   Admit date: 01/20/2020 Date of Consult: 01/21/2020  Primary Care Provider: Mosie Lukes, MD Primary Cardiologist: Glori Bickers, MD 01/06/2020 Primary Electrophysiologist:  None   Patient Profile:   Darren Mueller is a 69 y.o. male with a hx of COPD, portopulmonary hypertension with RV failure, and ETOH who is being seen today for the evaluation of CHF at the request of Dr Cyndia Skeeters.  History of Present Illness:   Darren Mueller was admitted in 6/17 with CP and SOB. Underwent R/L cath. Minimal CAD with moderate PAH and preserved cardiac output.   Admitted 06/2016 and 07/2016 with syncope. On September admission found to have elevated ETOH level as well as R 6th rib fracture. Wore 30 day monitor up until 08/28/16. No AF noted. No events.   Admitted February2018after syncopal event. LINQ placed.   Was admitted 4/20 with hematochezia and melena. His hemoglobin remained stable. No episodes of GI bleed after hospitalization. Underwent EGD by GI with finding of Grade 1 and a small esophageal varices, no evidence of recent bleeding,portal hypertensive gastropathy. Started on nadolol which made him very dizzy. He stopped this and feels a lot  Admitted 7/13-16/20 with volume overload. Diuresed 21 pounds. D/c weight 192.  Echo 05/17/19: LVEF >65% RV severely dilated with moderate RV dysfunction. Moderate TR RVSP 30mHG. While in hospital was pancytopenic. Possibility of BMBx discussed with Dr. SAlen Blewbut felt it was not necessary as it was felt he had splenic sequestration and counts were low but very stable.  01/06/2020 televisit with Dr. BHaroldine Laws He presents via audio/video conferencing for a telehealth visit today. He has been struggling with fluid overload. Recently metolazone was added but he stopped torsemide and was only taking metolazone. We corrected him and then restarted torsemide 60 bid. Says  weight down 215->207 -> 196. Edema much improved but not resolved. Main issue lately has been severe HAs and epistaxis for 2 weeks. Has humidified his room and epistaxis improved. Called PharmD and told HAs may be related to Uptravi. Saw PCP today and had pancytopenia (which is chronic) with platelets at 25k. Sent for stat head CT. (results pending). Denies any other neuro symptoms.   PAH meds 1) Macitnentan 10 mg daily 2) Adcirca 40 mg daily  3) Selexipeg 1000/1200 daily  01/21/2020: He weighs himself daily, his weight has been trending up. His dry weight is supposed to be 191 lbs, but he has not been anywhere near there since July 2020. Dr BHaroldine Lawsput him back on torsemide 60 mg bid, but PCP cut him back to 40 mg bid the next week. No new Na foods, keeps a close eye on it.   Has been gaining weight over the last few weeks. Got some improvement with the torsemide 60 mg bid, but started gaining again when dose reduced. Has not taken the metolazone.   He came to the hospital 03/19 because his legs were huge and he could not go to the grocery store because he was so SOB. Describes orthopnea and PND. Is breathing some better after Lasix overnight, nowhere near baseline.    Studies: ECHO 12/14 EF 55-60% Peak PA pressure 35. Severe RV dysfunction  ECHO 7/15 EF 60% RV moderately to severely dilated. Moderate HK RVSP 570mHG  ECHO 6/16 EF 60% RV moderately to severely dilated. Severe HK RVSP ~65 mm HG. D-shaped septum Echo 2/17 LVEF 60-65% RV massively dilated. Flat septum. Severe HK. Moderate TR RVSP ~  65. IVC small. No effusion  ECHO 01/01/2017 EF 50-55% Grade I DD. RV severely dilated. Peak PA pressure 62 mm hg Echo 9/19 with EF 60-65% RV markedly dilated and hypokinetic with D-shaped septum. RVSP 73mHG  Cath 6/17 Mid RCA lesion, 20% stenosed. Dist LAD lesion, 20% stenosed. Ao = 107/70 (87) LV = 106/3/11 RA = 4 RV = 74/11 PA = 74/26 (45) PCW = 6 Fick cardiac output/index =  4.6.2.2 PVR = 8.5 Ao sat = 95% PA sat = 61%, 58%  RHC 2/17 RA = 11 RV = 80/16/19 PA = 83/61 (71) PCW = 9 Fick cardiac output/index = 5.6/2.6 PVR = 10.3 WU Ao sat = 94% PA sat = 65%, 68% High SVC sat = 65%   PFTs 3/14 FEV1 2.02 L (58%) FVC 2.7L (54%) FEV1/FVC (89%) DLCO 53%  PFTs 1/17 FEV1 2.05 L (61%) FVC 2.48L (56%) DLCO 44%   Ab u/s 8/16: + cirrhosis/mild to moderate splenomegaly  6 min walk 01/10/13, 1290 feet  6MW (01/10/14) = 1280 feet (380 m) 6MW (9/15) = 1350 feet (411 m) O2 sats ranged from 89-95% on room air, HR ranged from 100-129. 6MW (6/16) = 384 meters  6MW (2/17) = 1250 feet (3857m6MW (8/17) = 1320 feet   Past Medical History:  Diagnosis Date  . Alcohol dependence (HC[facility]5/19/2012   In remission   . Alcohol dependence in remission (HCL[facility]5/19/2012   In remission   . Alcoholic cirrhosis of liver with ascites (HCMid[facility]othian2014  . Anginal pain ([facility] [place]   with SOB admitted 04/20/2019  . Cardiomegaly 02/2019   Noted on CXR  . CHF (congestive heart failure) ([facility]  . CKD (chronic kidney disease), stage III 07/18/2013  . COPD (chronic obstructive pulmonary disease) ([facility]  . Coronary artery disease   . Dermatitis 06/10/2015  . Diarrhea 09/04/2013  . Dyspnea    admitted 04/20/2019  . Elbow pain, left 03/17/2017  . Eustachian tube dysfunction   . GERD (gastroesophageal reflux disease)   . Gout   . Headache   . Hearing loss   . Heart murmur   . Hx of colonic polyps   . Hypertension   . Hypertriglyceridemia 03/17/2017  . Hypopotassemia   . Otalgia 11/28/2013  . Other chronic pulmonary heart diseases   . PAH (pulmonary artery hypertension) (HCC)    RV failure  . Pancytopenia (HCC[facility]uc[facility]  . Pleural effusion 2014   Noted on CT:   Moderately large simple layering right pleural effusion   . Preventative health care 06/29/2016  . Pulmonary nodule 2014   Noted on CT: An 8 mm pulmonary nodule in the periphery of the left lower lobe  . Rectal bleeding   . Red  eye 03/03/2016   right  . Seizures (HCP[facility] [place]  . Sleep apnea    does not wear CPAP  . Splenomegaly 12/2015   Noted on CT  . Thoracic aorta atherosclerosis (HCFlaming Gorge02/2019  . Thrombocytopenia (HCR[facility]inecl[facility]8/27/2017  . Unspecified hypothyroidism 01/25/2013  . Unspecified pleural effusion   . Vertigo   . Wears glasses     Past Surgical History:  Procedure Laterality Date  . CARDIAC CATHETERIZATION N/A 12/21/2015   Procedure: Right Heart Cath;  Surgeon: DaJolaine ArtistMD;  [facility];  Service: Cardiovascular;  Laterality: N/A;  . CARDIAC CATHETERIZATION N/A 04/15/2016   Procedure: Right/Left Heart Cath and Coronary Angiography;  Surgeon: DaJolaine ArtistMD;  [facility];  Service:  Cardiovascular;  Laterality: N/A;  . COLONOSCOPY N/A 12/08/2013   Procedure: COLONOSCOPY;  Surgeon: Milus Banister, MD;  [facility];  Service: Endoscopy;  Laterality: N/A;  . COLONOSCOPY WITH PROPOFOL N/A 03/24/2019   Procedure: COLONOSCOPY WITH PROPOFOL;  Surgeon: Milus Banister, MD;  [facility];  Service: Endoscopy;  Laterality: N/A;  Draw CBC in preop  . COLONOSCOPY WITH PROPOFOL N/A 04/28/2019   Procedure: COLONOSCOPY WITH PROPOFOL;  Surgeon: Milus Banister, MD;  [facility];  Service: Endoscopy;  Laterality: N/A;  . ESOPHAGOGASTRODUODENOSCOPY (EGD) WITH PROPOFOL N/A 03/27/2016   Procedure: ESOPHAGOGASTRODUODENOSCOPY (EGD) WITH PROPOFOL;  Surgeon: Milus Banister, MD;  Location: [place];  Service: Endoscopy;  Laterality: N/A;  . ESOPHAGOGASTRODUODENOSCOPY (EGD) WITH PROPOFOL N/A 02/14/2019   Procedure: ESOPHAGOGASTRODUODENOSCOPY (EGD) WITH PROPOFOL;  Surgeon: Jerene Bears, MD;  [facility] END[facility]COPY;  Service: Gastroenterology;  Laterality: N/A;  . EYE SURGERY Bilateral 03/15/2019   lasix  . GAS/FLUID EXCHANGE Right 09/22/2019   Procedure: GAS/FLUID EXCHANGE (C3F8) RIGHT EYE;  Surgeon: Sherlynn Stalls, MD;  Location: [place];  Service:  Ophthalmology;  Laterality: Right;  . HEMOSTASIS CLIP PLACEMENT  04/28/2019   Procedure: HEMOSTASIS CLIP PLACEMENT;  Surgeon: Milus Banister, MD;  [facility];  Service: Endoscopy;;  . LOOP RECORDER INSERTION N/A 01/01/2017   Procedure: Loop Recorder Insertion;  Surgeon: Thompson Grayer, MD;  Location: [place] [facility] LAB;  Service: Cardiovascular;  Laterality: N/A;  . PARS PLANA VITRECTOMY Right 09/22/2019   Procedure: PARS PLANA VITRECTOMY WITH 25 GAUGE RIGHT EYE;  Surgeon: Sherlynn Stalls, MD;  Location: [place];  Service: Ophthalmology;  Laterality: Right;  . PHOTOCOAGULATION WITH LASER Right 09/22/2019   Procedure: PHOTOCOAGULATION WITH LASER RIGHT EYE;  Surgeon: Sherlynn Stalls, MD;  Location: [place];  Service: Ophthalmology;  Laterality: Right;  . POLYPECTOMY  04/28/2019   Procedure: POLYPECTOMY;  Surgeon: Milus Banister, MD;  [facility];  Service: Endoscopy;;  . RIGHT HEART CATHETERIZATION Right 06/06/2014   Procedure: RIGHT HEART CATH;  Surgeon: Jolaine Artist, MD;  Location: [facility] CATH LAB;  Service: Cardiovascular;  Laterality: Right;  . UVULOPALATOPHARYNGOPLASTY  1999     Prior to Admission medications   Medication Sig Start Date End Date Taking? Authorizing Provider  acetaminophen (TYLENOL) 500 MG tablet Take 1,000 mg by mouth every 6 (six) hours as needed (for pain.).    [provider]  [facility] [phone] MCG/ACT inhaler USE 2 INHALATIONS EVERY MORNING 11/04/19   Mosie Lukes, MD  allopurinol (ZYLOPRIM) 100 MG tablet Take 2 tablets (200 mg total) by mouth daily. 07/12/19   Mosie Lukes, MD  Blood Pressure Monitoring (BLOOD PRESSURE CUFF) MISC Use as directed. Check blood pressure bid prn Dx:I10 02/22/19   Mosie Lukes, MD  diphenoxylate-atropine (LOMOTIL) 2.5-0.025 MG tablet Take 1 tablet by mouth 4 (four) times daily as needed for diarrhea or loose stools. 12/01/19   Mosie Lukes, MD  Ferrous Fumarate (HEMOCYTE) 324 (106 Fe) MG TABS tablet Take 1  tablet (106 mg of iron total) by mouth daily. 05/05/19   Mosie Lukes, MD  folic acid (FOLVITE) 1 MG tablet Take 1 tablet (1 mg total) by mouth daily. 01/11/20   Saguier, Percell Miller, PA-C  hydrOXYzine (ATARAX/VISTARIL) 25 MG tablet Take 1 tablet (25 mg total) by mouth 3 (three) times daily as needed. 08/30/19   Mosie Lukes, MD  hyoscyamine (LEVSIN SL) 0.125 MG SL tablet Place 1 tablet (0.125 mg total) under the  tongue every 6 (six) hours as needed. 08/23/19   Mosie Lukes, MD  macitentan (OPSUMIT) 10 MG tablet Take 1 tablet (10 mg total) by mouth daily. 09/26/19   Bensimhon, Shaune Pascal, MD  mometasone-formoterol (DULERA) 100-5 MCG/ACT AERO Inhale 2 puffs into the lungs 2 (two) times daily.    [provider]  Multiple Vitamin (MULTIVITAMIN) tablet Take 1 tablet by mouth daily.    [provider]  mupirocin ointment (BACTROBAN) 2 % Place 1 application into the nose at bedtime. 12/01/19   Mosie Lukes, MD  omeprazole (PRILOSEC) 40 MG capsule TAKE 1 CAPSULE BY MOUTH ONCE DAILY 11/14/19   Saguier, Percell Miller, PA-C  Potassium Chloride ER 20 MEQ TBCR Take 80 mEq by mouth every morning AND 60 mEq every evening. 12/06/19   Bensimhon, Shaune Pascal, MD  rosuvastatin (CRESTOR) 5 MG tablet TAKE 1 TABLET DAILY 08/31/19   Mosie Lukes, MD  Selexipag (UPTRAVI) 1000 MCG TABS Take 1,000 mcg by mouth 2 (two) times daily. 06/03/19   Bensimhon, Shaune Pascal, MD  tadalafil, PAH, (ADCIRCA) 20 MG tablet Take 2 tablets (40 mg total) by mouth daily. 11/18/19   Bensimhon, Shaune Pascal, MD  thiamine 100 MG tablet Take 1 tablet (100 mg total) by mouth daily. 05/05/19   Mosie Lukes, MD  torsemide (DEMADEX) 20 MG tablet Take 3 tablets (60 mg total) by mouth 2 (two) times daily. 11/11/19   Bensimhon, Shaune Pascal, MD  traZODone (DESYREL) 50 MG tablet Take 0.5-1 tablets (25-50 mg total) by mouth at bedtime as needed for sleep. 01/11/20   Saguier, Percell Miller, PA-C    Inpatient Medications: Scheduled Meds: . allopurinol  200 mg Oral  Daily  . Ferrous Fumarate  1 tablet Oral Daily  . folic acid  1 mg Oral Daily  . furosemide  80 mg Intravenous Q12H  . macitentan  10 mg Oral Daily  . mometasone-formoterol  2 puff Inhalation BID  . multivitamin with minerals  1 tablet Oral Daily  . pantoprazole  40 mg Oral Daily  . rosuvastatin  5 mg Oral Daily  . Selexipag  1,000 mcg Oral BID  . sodium chloride flush  3 mL Intravenous Q12H  . tadalafil  40 mg Oral Daily  . thiamine  100 mg Oral Daily   Continuous Infusions: . sodium chloride     PRN Meds: sodium chloride, acetaminophen, albuterol, hydrOXYzine, ondansetron (ZOFRAN) IV, sodium chloride flush  Allergies:    Allergies  Allergen Reactions  . Aspirin Hypertension  . Nitroglycerin Other (See Comments)    Severe Headache    Social History:   Social History   Socioeconomic History  . Marital status: Divorced    Spouse name: Not on file  . Number of children: 3  . Years of education: Not on file  . Highest education level: Not on file  Occupational History  . Occupation: Retired    Comment: Social research officer, government  Tobacco Use  . Smoking status: Former Smoker    Packs/day: 2.00    Years: 5.00    Pack years: 10.00    Types: Cigarettes    Quit date: 09/22/1979    Years since quitting: 40.3  . Smokeless tobacco: Never Used  Substance and Sexual Activity  . Alcohol use: Yes    Alcohol/week: 6.0 standard drinks    Types: 6 Shots of liquor per week    Comment: social drinker  . Drug use: No  . Sexual activity: Yes    Birth control/protection: Spermicide  Other Topics Concern  . Not on file  Social History Narrative   0 caffeine drinks daily    Social Determinants of Health   Financial Resource Strain: Low Risk   . Difficulty of Paying Living Expenses: Not hard at all  Food Insecurity: No Food Insecurity  . Worried About Charity fundraiser in the Last Year: Never true  . Ran Out of Food in the Last Year: Never true  Transportation Needs: No Transportation  Needs  . Lack of Transportation (Medical): No  . Lack of Transportation (Non-Medical): No  Physical Activity: Inactive  . Days of Exercise per Week: 0 days  . Minutes of Exercise per Session: 0 min  Stress: Stress Concern Present  . Feeling of Stress : Rather much  Social Connections: Slightly Isolated  . Frequency of Communication with Friends and Family: More than three times a week  . Frequency of Social Gatherings with Friends and Family: Three times a week  . Attends Religious Services: More than 4 times per year  . Active Member of Clubs or Organizations: Yes  . Attends Archivist Meetings: More than 4 times per year  . Marital Status: Divorced  Human resources officer Violence:   . Fear of Current or Ex-Partner:   . Emotionally Abused:   Marland Kitchen Physically Abused:   . Sexually Abused:     Family History:   Family History  Problem Relation Age of Onset  . Heart disease Mother   . Heart attack Mother   . Hypertension Mother   . Kidney failure Mother   . Liver disease Mother        stage 4 liver disease  . Prostate cancer Father   . Cancer Father        lung  . Hypertension Brother   . Gout Brother   . Alcoholism Maternal Grandfather   . Liver disease Maternal Grandfather   . Migraines Sister   . Hypertension Brother   . Colon cancer Neg Hx    Family Status:  Family Status  Relation Name Status  . Mother 87 Alive  . Father 74 Deceased  . Sister 28,sharon Alive  . Brother Retail buyer  . Daughter 35,Courtney Alive  . Son 24,Curtis Alive  . MGM 75 Deceased  . MGF 87 Deceased  . PGM  Deceased  . PGF  Deceased  . Sister 31,sheila Alive  . Brother 48,ron Alive  . Son 21,Jordan Alive  . Neg Hx  (Not Specified)    ROS:  Please see the history of present illness.  All other ROS reviewed and negative.     Physical Exam/Data:   Vitals:   01/20/20 2300 01/21/20 0512 01/21/20 0809 01/21/20 0908  BP:  107/61 108/84   Pulse:  82 78   Resp:  18 20   Temp:  98  F (36.7 C)    TempSrc:  Oral    SpO2: 100% 99% 99% 97%  Weight:  96.7 kg    Height:        Intake/Output Summary (Last 24 hours) at 01/21/2020 0930 Last data filed at 01/21/2020 0824 Gross per 24 hour  Intake 600 ml  Output 2125 ml  Net -1525 ml    Last 3 Weights 01/21/2020 01/20/2020 01/20/2020  Weight (lbs) 213 lb 1.6 oz 223 lb 15.8 oz 224 lb  Weight (kg) 96.662 kg 101.6 kg 101.606 kg     Body mass index is 28.9 kg/m.   General:  Well nourished, well developed,  male in no acute distress HEENT: normal Lymph: no adenopathy Neck: JVD - 11-12 cm Endocrine:  No thryomegaly Vascular: No carotid bruits; 4/4 extremity pulses 2+  Cardiac:  normal S1, S2; RRR;  murmur Lungs:  Rales bases bilaterally, no wheezing, rhonchi  Abd: soft, nontender, no hepatomegaly  Ext: 2-3+ edema Musculoskeletal:  No deformities, BUE and BLE strength normal and equal Skin: warm and dry  Neuro:  CNs 2-12 intact, no focal abnormalities noted Psych:  Normal affect   EKG:  The EKG was personally reviewed and demonstrates:  03/19 ECG is SR, HR 82, anterior T wave inversions are old Telemetry:  Telemetry was personally reviewed and demonstrates:  SR    CV studies:   ECHO: 05/17/2019 1. The left ventricle has hyperdynamic systolic function, with an  ejection fraction of >65%. The cavity size was normal. There is moderately  increased left ventricular wall thickness. Left ventricular diastolic  Doppler parameters are consistent with  impaired relaxation. 8-15 There is right ventricular volume and pressure  overload. No evidence of left ventricular regional wall motion  abnormalities.  2. The right ventricle has moderately reduced systolic function. The  cavity was severely enlarged. There is mildly increased right ventricular  wall thickness.  3. Right atrial size was severely dilated.  4. The pericardial effusion is circumferential.  5. Trivial pericardial effusion is present.  6. Moderately  thickened tricuspid valve leaflets.  7. The mitral valve is abnormal. Mild thickening of the mitral valve  leaflet.  8. The tricuspid valve is myxomatous. Tricuspid valve regurgitation is  moderate.  9. The aortic valve is tricuspid. Moderate sclerosis of the aortic valve.  Aortic valve regurgitation is mild by color flow Doppler. No stenosis of  the aortic valve.  10. The inferior vena cava was dilated in size with <50% respiratory  variability.  11. When compared to the prior study: 07/22/2018: LVEF 65-70%, RVSP 68  mmHg.   R/L CATH: 04/15/2016  Mid RCA lesion, 20% stenosed.  Dist LAD lesion, 20% stenosed.   Findings:  Ao = 107/70 (87) LV =  106/3/11 RA =  4 RV = 74/11 PA = 74/26 (45) PCW = 6 Fick cardiac output/index = 4.6.2.2 PVR = 8.5 Ao sat = 95% PA sat = 61%, 58%  Assessment: 1. Minimal CAD 2. Moderate pulmonary HTN (improved from previous cath) 3. Normal left sided pressures after diuresis  Plan/Discussion:  Continue medical therapy. Hopefully discharge in am if renal function and access sites stable.   Laboratory Data:   Chemistry Recent Labs  Lab 01/16/20 1524 01/20/20 1503  NA 135 135  K 3.4* 3.5  CL 103 105  CO2 23 22  GLUCOSE 113* 112*  BUN 65* 59*  CREATININE 3.33* 2.80*  CALCIUM 8.5 8.4*  GFRNONAA  --  22*  GFRAA  --  26*  ANIONGAP  --  8    Lab Results  Component Value Date   ALT 12 01/20/2020   AST 45 (H) 01/20/2020   ALKPHOS 418 (H) 01/20/2020   BILITOT 1.9 (H) 01/20/2020   Hematology Recent Labs  Lab 01/20/20 1503 01/21/20 0841  WBC 1.8*  --   RBC 2.70* 2.55*  HGB 8.8*  --   HCT 29.0*  --   MCV 107.4*  --   MCH 32.6  --   MCHC 30.3  --   RDW 18.0*  --   PLT 30*  --    Cardiac Enzymes High Sensitivity Troponin:   Recent Labs  Lab  01/20/20 1503 01/20/20 1649  TROPONINIHS 33* 34*      BNP Recent Labs  Lab 01/20/20 1504  BNP 506.0*     TSH:  Lab Results  Component Value Date   TSH 2.50 06/02/2019    Lipids: Lab Results  Component Value Date   CHOL 145 10/11/2019   HDL 57.00 10/11/2019   LDLCALC 73 10/11/2019   LDLDIRECT 34.0 05/04/2018   TRIG 74.0 10/11/2019   CHOLHDL 3 10/11/2019   HgbA1c: Lab Results  Component Value Date   HGBA1C 4.8 04/13/2019   Magnesium:  Magnesium  Date Value Ref Range Status  01/21/2020 1.7 1.7 - 2.4 mg/dL Final    Comment:    Performed at [facility], [place] [address] [place], [place] [address]     Radiology/Studies:  [facility] Chest Port 1 View  Result Date: 01/20/2020 CLINICAL DATA:  Shortness of breath and chest pain EXAM: PORTABLE CHEST 1 VIEW COMPARISON:  01/11/2020, 12/08/2019 FINDINGS: Cardiomegaly with mild central congestion. No consolidation, pleural effusion or pneumothorax. Recording device over the left lower chest. IMPRESSION: Cardiomegaly with mild central vascular congestion Electronically Signed   By: Donavan Foil M.D.   On: 01/20/2020 15:01    Assessment and Plan:   1. Acute CHF, RV dysfunction and diastolic dysfunction - continue Lasix 80 mg IV bid - this is working so will not add metolazone for now - Continue to follow I/O, daily wts, daily BMET  2. PAH - son to bring in meds that [facility] does not have so will not miss doses  3. CKD III-IV - Cr trending down - baseline seems to be in the low-mid 2's, when Cr < 2.0, he usually needs diuresis  Active Problems:   CHF (congestive heart failure) ([place])   For questions or updates, please contact [facility] Please consult [email or web address] for contact info under Cardiology/STEMI.   Signed, Rosaria Ferries, PA-C  01/21/2020 9:30 AM   Patient seen with PA, agree with the above note.   Patient has history of portopulmonary hypertension with RV failure, cirrhosis, and chronic pancytopenia.  He has struggled with cardiorenal syndrome.  Recently had torsemide cut back because creatinine rose to > 3.  However, he gained 12 lbs and is now short of breath walking around his  house.  His PCP sent him to the ER and he was admitted.  Creatinine 2.8 today, down from 3.33.   General: NAD Neck: JVP 16 cm, no thyromegaly or thyroid nodule.  Lungs: Clear to auscultation bilaterally with normal respiratory effort. CV: Nondisplaced PMI.  Heart regular S1/S2 with loud P2, no S3/S4, 2/6 HSM LLSB.  1+ edema to knees.   Abdomen: Soft, nontender, no hepatosplenomegaly, mild distention.  Skin: Intact without lesions or rashes.  Neurologic: Alert and oriented x 3.  Psych: Normal affect. Extremities: No clubbing or cyanosis.  HEENT: Normal.   1. RV failure: Patient has RV failure due to portopulmonary hypertension.  Echo in 7/20 with LV EF > 65%, moderately dilated and severely dysfunctional RV.  He has cardiorenal syndrome, recently torsemide was cut back with rise in creatinine to > 3.  Creatinine now down, 2.8 today, but he gained 12 lbs and is volume overloaded with severe symptoms.  Responding well to IV Lasix.  - Continue Lasix 80 mg IV bid for now, replace K.  - If renal function worsens, consider milrinone to support RV.  - Repeat echo ordered.  2. Pulmonary hypertension: Thought to have portopulmonary hypertension with RV failure.   -  Continue home PH meds (Selexipag, tadalafil, and Opsumit).  3. AKI on CKD stage.  Creatinine 2.88 today, down from 3.33.  Follow with diuresis, hopefully unloading RV and decreasing renal venous pressure will help with renal function.  4. Cirrhosis: From ETOH.  5. Pancytopenia: This is known from the past.  Thought to be related to cirrhosis/splenic sequestration.   Loralie Champagne 01/21/2020 10:59 AM

## 2020-01-21 NOTE — Plan of Care (Signed)
  Problem: Health Behavior/Discharge Planning: Goal: Ability to manage health-related needs will improve Outcome: Progressing   Problem: Education: Goal: Ability to demonstrate management of disease process will improve Outcome: Progressing Goal: Ability to verbalize understanding of medication therapies will improve Outcome: Progressing Goal: Individualized Educational Video(s) Outcome: Progressing   Problem: Activity: Goal: Capacity to carry out activities will improve Outcome: Progressing   Problem: Cardiac: Goal: Ability to achieve and maintain adequate cardiopulmonary perfusion will improve Outcome: Progressing

## 2020-01-21 NOTE — Evaluation (Signed)
Occupational Therapy Evaluation Patient Details Name: Darren Mueller MRN: [id number] DOB: 09/15/1951 Today's Date: 01/21/2020    History of Present Illness 69 y.o. male with PMH significant for portopulmonary hypertension with RV failure, ETOH cirrhosis,Grade 1, small esophageal varices, CAD: R/L cath 6/17 noting minimal CAD with moderate PAH and preserved cardiac output, CKDIIIa, COPD,  HTN, HLD, vertigo, seizures, gout. herat murmur and pancytopenia who presents to the ED in referral from pcp due concern for CHF exacerbation with  history of worsening shortness of breath, and increase lower extremity swelling. Patient notes associated weight gain of around 6lb over the last one week and dry cough.    Clinical Impression   PTA patient independent and driving. Admitted for above and limited by problem list below, including B LE edema, decreased activity tolerance. Pt on RA with SOB noted during minimal activity, VSS.  Patient completing transfers sit to stand with modified independence, toileting using urinal standing with modified independence, but difficulty with functional reach to BLEs today for ADLs requiring mod assist. Limited session due to cardiology arriving, but believe he will benefit from further OT services while admitted to optimize independence and activity tolerance during ADLs but anticipate no further needs after dc home.     Follow Up Recommendations  No OT follow up;Supervision - Intermittent    Equipment Recommendations  3 in 1 bedside commode(pending progress )    Recommendations for Other Services       Precautions / Restrictions Restrictions Weight Bearing Restrictions: No      Mobility Bed Mobility Overal bed mobility: Modified Independent             General bed mobility comments: HOB elevated but no assist required to EOB   Transfers Overall transfer level: Modified independent               General transfer comment: sit to stand at EOB     Balance Overall balance assessment: Mild deficits observed, not formally tested                                         ADL either performed or assessed with clinical judgement   ADL Overall ADL's : Needs assistance/impaired     Grooming: Set up;Sitting   Upper Body Bathing: Set up;Sitting   Lower Body Bathing: Minimal assistance;Sit to/from stand Lower Body Bathing Details (indicate cue type and reason): unable to reach LEs for bathing  Upper Body Dressing : Set up;Sitting   Lower Body Dressing: Moderate assistance;Sit to/from stand Lower Body Dressing Details (indicate cue type and reason): unable to reach BLEs to manage socks, no assist sit to stand      Toileting- Clothing Manipulation and Hygiene: Modified independent;Sit to/from stand Toileting - Clothing Manipulation Details (indicate cue type and reason): using urinal at EOB        General ADL Comments: limited session, cardiology entering room; noted increased SOB with minimal activity but VSS on RA      Vision Baseline Vision/History: (reports recent surgery due detached retina but [facility] ) Patient Visual Report: No change from baseline Vision Assessment?: No apparent visual deficits     Perception     Praxis      Pertinent Vitals/Pain Pain Assessment: No/denies pain     Hand Dominance Right   Extremity/Trunk Assessment Upper Extremity Assessment Upper Extremity Assessment: Overall WFL for tasks assessed   Lower  Extremity Assessment Lower Extremity Assessment: Defer to PT evaluation       Communication Communication Communication: No difficulties   Cognition Arousal/Alertness: Awake/alert Behavior During Therapy: WFL for tasks assessed/performed Overall Cognitive Status: Within Functional Limits for tasks assessed                                     General Comments  limited session but noted increased BLE edema, decreased activity tolerance and poor functional  reach to BLEs     Exercises     Shoulder Instructions      Home Living Family/patient expects to be discharged to:: Private residence Living Arrangements: Children Available Help at Discharge: Family;Available 24 hours/day Type of Home: House Home Access: Stairs to enter [employer] of Steps: 2 Entrance Stairs-Rails: Can reach both Home Layout: One level     Bathroom Shower/Tub: Teacher, early years/pre: Standard     Home Equipment: None          Prior Functioning/Environment Level of Independence: Independent        Comments: independent and driving         OT Problem List: Decreased activity tolerance;Decreased knowledge of use of DME or AE;Decreased knowledge of precautions;Increased edema;Cardiopulmonary status limiting activity      OT Treatment/Interventions: Self-care/ADL training;Energy conservation;DME and/or AE instruction;Therapeutic activities;Patient/family education    OT Goals(Current goals can be found in the care plan section) Acute Rehab OT Goals Patient Stated Goal: to feel better  OT Goal Formulation: With patient Time For Goal Achievement: 02/04/20 Potential to Achieve Goals: Good  OT Frequency: Min 2X/week   Barriers to D/C:            Co-evaluation              AM-PAC OT "6 Clicks" Daily Activity     Outcome Measure Help from another person eating meals?: None Help from another person taking care of personal grooming?: None Help from another person toileting, which includes using toliet, bedpan, or urinal?: None Help from another person bathing (including washing, rinsing, drying)?: A Little Help from another person to put on and taking off regular upper body clothing?: None Help from another person to put on and taking off regular lower body clothing?: A Little 6 Click Score: 22   End of Session Nurse Communication: Mobility status  Activity Tolerance: Patient tolerated treatment well Patient left:  with call bell/phone within reach;Other (comment)(seated EOB )  OT Visit Diagnosis: Other abnormalities of gait and mobility (R26.89);Other (comment)(decreased activity tolerance, B LE edema )                Time: 1005-1018 OT Time Calculation (min): 13 min Charges:  OT General Charges $OT Visit: 1 Visit OT Evaluation $OT Eval Low Complexity: 1 Low  Jolaine Artist, OT Acute Rehabilitation Services Pager [phone] Office [phone]   Delight Stare 01/21/2020, 11:14 AM

## 2020-01-22 LAB — COMPREHENSIVE METABOLIC PANEL
ALT: 14 U/L (ref 0–44)
AST: 46 U/L — ABNORMAL HIGH (ref 15–41)
Albumin: 2.7 g/dL — ABNORMAL LOW (ref 3.5–5.0)
Alkaline Phosphatase: 381 U/L — ABNORMAL HIGH (ref 38–126)
Anion gap: 10 (ref 5–15)
BUN: 43 mg/dL — ABNORMAL HIGH (ref 8–23)
CO2: 23 mmol/L (ref 22–32)
Calcium: 8.6 mg/dL — ABNORMAL LOW (ref 8.9–10.3)
Chloride: 106 mmol/L (ref 98–111)
Creatinine, Ser: 2.47 mg/dL — ABNORMAL HIGH (ref 0.61–1.24)
GFR calc Af Amer: 30 mL/min — ABNORMAL LOW (ref >60)
GFR calc non Af Amer: 26 mL/min — ABNORMAL LOW (ref >60)
Glucose, Bld: 131 mg/dL — ABNORMAL HIGH (ref 70–99)
Potassium: 3.8 mmol/L (ref 3.5–5.1)
Sodium: 139 mmol/L (ref 135–145)
Total Bilirubin: 2.3 mg/dL — ABNORMAL HIGH (ref 0.3–1.2)
Total Protein: 6.7 g/dL (ref 6.5–8.1)

## 2020-01-22 LAB — MAGNESIUM
Magnesium: 2 mg/dL (ref 1.7–2.4)
Magnesium: 2 mg/dL (ref 1.7–2.4)

## 2020-01-22 LAB — RENAL FUNCTION PANEL
Albumin: 2.5 g/dL — ABNORMAL LOW (ref 3.5–5.0)
Anion gap: 12 (ref 5–15)
BUN: 46 mg/dL — ABNORMAL HIGH (ref 8–23)
CO2: 23 mmol/L (ref 22–32)
Calcium: 8.4 mg/dL — ABNORMAL LOW (ref 8.9–10.3)
Chloride: 103 mmol/L (ref 98–111)
Creatinine, Ser: 2.51 mg/dL — ABNORMAL HIGH (ref 0.61–1.24)
GFR calc Af Amer: 29 mL/min — ABNORMAL LOW (ref >60)
GFR calc non Af Amer: 25 mL/min — ABNORMAL LOW (ref >60)
Glucose, Bld: 118 mg/dL — ABNORMAL HIGH (ref 70–99)
Phosphorus: 2.7 mg/dL (ref 2.5–4.6)
Potassium: 3.3 mmol/L — ABNORMAL LOW (ref 3.5–5.1)
Sodium: 138 mmol/L (ref 135–145)

## 2020-01-22 LAB — LIPASE, BLOOD: Lipase: 45 U/L (ref 11–51)

## 2020-01-22 MED ORDER — POTASSIUM CHLORIDE CRYS ER 20 MEQ PO TBCR
40.0000 meq | EXTENDED_RELEASE_TABLET | ORAL | Status: AC
  Administered 2020-01-22 (×2): 40 meq via ORAL
  Filled 2020-01-22 (×2): qty 2

## 2020-01-22 MED ORDER — HYOSCYAMINE SULFATE 0.125 MG PO TABS
0.1250 mg | ORAL_TABLET | Freq: Two times a day (BID) | ORAL | Status: DC | PRN
  Filled 2020-01-22 (×2): qty 1

## 2020-01-22 MED ORDER — HYOSCYAMINE SULFATE 0.125 MG PO TBDP
0.1250 mg | ORAL_TABLET | Freq: Two times a day (BID) | ORAL | Status: DC | PRN
  Filled 2020-01-22 (×2): qty 1

## 2020-01-22 MED ORDER — RAMELTEON 8 MG PO TABS
8.0000 mg | ORAL_TABLET | Freq: Every day | ORAL | Status: DC
  Administered 2020-01-22: 21:00:00 8 mg via ORAL
  Filled 2020-01-22 (×2): qty 1

## 2020-01-22 MED ORDER — SUMATRIPTAN SUCCINATE 6 MG/0.5ML ~~LOC~~ SOLN
6.0000 mg | Freq: Once | SUBCUTANEOUS | Status: AC
  Administered 2020-01-22: 21:00:00 6 mg via SUBCUTANEOUS
  Filled 2020-01-22: qty 0.5

## 2020-01-22 MED ORDER — OXYCODONE HCL 5 MG PO TABS
5.0000 mg | ORAL_TABLET | Freq: Four times a day (QID) | ORAL | Status: DC | PRN
  Administered 2020-01-22 – 2020-01-26 (×5): 5 mg via ORAL
  Filled 2020-01-22 (×5): qty 1

## 2020-01-22 MED ORDER — ZOLPIDEM TARTRATE 5 MG PO TABS: 5.0000 mg | ORAL_TABLET | Freq: Every evening | ORAL | Status: DC | PRN

## 2020-01-22 NOTE — Progress Notes (Signed)
Patient ID: Darren Mueller, male   DOB: Oct 14, 1951, 69 y.o.   MRN: [id number]     Advanced Heart Failure Rounding Note  PCP-Cardiologist: Glori Bickers, MD   Subjective:    Good diuresis yesterday, weight down 6 lbs.  No complaints, wants to go home.   Echo: EF 60-65%, moderate LVH.  D-shaped interventricular septum, RV severely dilated/severely dysfunctional.  IVC dilated. PASP 83 mmHg.    Objective:   Weight Range: 93.9 kg Body mass index is 28.07 kg/m.   Vital Signs:   Temp:  [98 F (36.7 C)] 98 F (36.7 C) (03/20 2121) Pulse Rate:  [70-79] 79 (03/21 0906) Resp:  [16-20] 18 (03/21 0906) BP: (104-129)/(60-80) 104/60 (03/21 0906) SpO2:  [98 %-100 %] 98 % (03/21 0906) Weight:  [93.9 kg] 93.9 kg (03/21 0500) Last BM Date: 01/21/20  Weight change: Filed Weights   01/20/20 1426 01/21/20 0512 01/22/20 0500  Weight: 101.6 kg 96.7 kg 93.9 kg    Intake/Output:   Intake/Output Summary (Last 24 hours) at 01/22/2020 0932 Last data filed at 01/22/2020 0500 Gross per 24 hour  Intake 750 ml  Output 2200 ml  Net -1450 ml      Physical Exam    General:  Well appearing. No resp difficulty HEENT: Normal Neck: Supple. JVP 16 cm. Carotids 2+ bilat; no bruits. No lymphadenopathy or thyromegaly appreciated. Cor: PMI nondisplaced. Regular rate & rhythm. 2/6 HSM apex. Loud P2. Lungs: Clear Abdomen: Soft, nontender, mildly distended. No hepatosplenomegaly. No bruits or masses. Good bowel sounds. Extremities: No cyanosis, clubbing, rash. 1+ edema to knees.  Neuro: Alert & orientedx3, cranial nerves grossly intact. moves all 4 extremities w/o difficulty. Affect pleasant   Telemetry   NSR, personally reviewed  Labs    CBC Recent Labs    01/20/20 1503  WBC 1.8*  NEUTROABS 1.0*  HGB 8.8*  HCT 29.0*  MCV 107.4*  PLT 30*   Basic Metabolic Panel Recent Labs    01/20/20 1503 01/21/20 0841 01/22/20 0428  NA 135  --  138  K 3.5  --  3.3*  CL 105  --  103  CO2 22   --  23  GLUCOSE 112*  --  118*  BUN 59*  --  46*  CREATININE 2.80*  --  2.51*  CALCIUM 8.4*  --  8.4*  MG  --  1.7 2.0  PHOS  --   --  2.7   Liver Function Tests Recent Labs    01/20/20 1503 01/22/20 0428  AST 45*  --   ALT 12  --   ALKPHOS 418*  --   BILITOT 1.9*  --   PROT 7.1  --   ALBUMIN 3.0* 2.5*   No results for input(s): LIPASE, AMYLASE in the last 72 hours. Cardiac Enzymes No results for input(s): CKTOTAL, CKMB, CKMBINDEX, TROPONINI in the last 72 hours.  BNP: BNP (last 3 results) Recent Labs    02/12/19 2356 05/16/19 1051 01/20/20 1504  BNP 158.1* 1,119.2* 506.0*    ProBNP (last 3 results) Recent Labs    10/11/19 1138 12/08/19 1041 01/11/20 1439  PROBNP 604.0* 710.0* 494.0*     D-Dimer No results for input(s): DDIMER in the last 72 hours. Hemoglobin A1C No results for input(s): HGBA1C in the last 72 hours. Fasting Lipid Panel No results for input(s): CHOL, HDL, LDLCALC, TRIG, CHOLHDL, LDLDIRECT in the last 72 hours. Thyroid Function Tests No results for input(s): TSH, T4TOTAL, T3FREE, THYROIDAB in the last 72 hours.  Invalid input(s): FREET3  Other results:   Imaging    ECHOCARDIOGRAM COMPLETE  Result Date: 01/21/2020    ECHOCARDIOGRAM REPORT   Patient Name:   Darren Mueller Date of Exam: 01/21/2020 Medical Rec #:  [id number]         Height:       72.0 in Accession #:    [id number]        Weight:       213.1 lb Date of Birth:  [birth year]          BSA:          2.189 m Patient Age:    42 years          BP:           109/65 mmHg Patient Gender: M                 HR:           70 bpm. Exam Location:  Inpatient Procedure: 2D Echo, Cardiac Doppler and Color Doppler Indications:    Abnormal ECG 794.31/R94.31  History:        Patient has prior history of Echocardiogram examinations, most                 recent 05/17/2019. CHF, Pulmonary HTN; Risk Factors:Hypertension,                 Former Smoker and Sleep Apnea. PAH.  Sonographer:    Clayton Lefort RDCS  (AE) Referring Phys: [id number] [facility]  Sonographer Comments: Suboptimal apical window. IMPRESSIONS  1. Left ventricular ejection fraction, by estimation, is 60 to 65%. The left ventricle has normal function. The left ventricle has no regional wall motion abnormalities. There is moderate left ventricular hypertrophy. Left ventricular diastolic parameters are consistent with Grade II diastolic dysfunction (pseudonormalization). There is the interventricular septum is flattened in systole and diastole, consistent with right ventricular pressure and volume overload.  2. Prominent RV trabeculation. Right ventricular systolic function is severely reduced. The right ventricular size is severely enlarged. mildly increased right ventricular wall thickness. There is severely elevated pulmonary artery systolic pressure. The estimated right ventricular systolic pressure is 62.9 mmHg.  3. Left atrial size was mildly dilated.  4. Right atrial size was severely dilated.  5. The mitral valve is grossly normal. Trivial mitral valve regurgitation.  6. Tricuspid valve regurgitation is moderate.  7. The aortic valve is tricuspid. Aortic valve regurgitation is mild.  8. The inferior vena cava is dilated in size with <50% respiratory variability, suggesting right atrial pressure of 15 mmHg. FINDINGS  Left Ventricle: Left ventricular ejection fraction, by estimation, is 60 to 65%. The left ventricle has normal function. The left ventricle has no regional wall motion abnormalities. The left ventricular internal cavity size was small. There is moderate  left ventricular hypertrophy. The interventricular septum is flattened in systole and diastole, consistent with right ventricular pressure and volume overload. Left ventricular diastolic parameters are consistent with Grade II diastolic dysfunction (pseudonormalization). Right Ventricle: Prominent RV trabeculation. The right ventricular size is severely enlarged. Mildly increased  right ventricular wall thickness. Right ventricular systolic function is severely reduced. There is severely elevated pulmonary artery systolic  pressure. The tricuspid regurgitant velocity is 4.13 m/s, and with an assumed right atrial pressure of 15 mmHg, the estimated right ventricular systolic pressure is 47.6 mmHg. Left Atrium: Left atrial size was mildly dilated. Right Atrium: Right atrial size was severely dilated. Pericardium: A small pericardial effusion  is present. The pericardial effusion is posterior to the left ventricle. Mitral Valve: The mitral valve is grossly normal. There is mild thickening of the mitral valve leaflet(s). Mild mitral annular calcification. Trivial mitral valve regurgitation. MV peak gradient, 5.6 mmHg. The mean mitral valve gradient is 2.0 mmHg. Tricuspid Valve: The tricuspid valve is grossly normal. Tricuspid valve regurgitation is moderate. Aortic Valve: The aortic valve is tricuspid. Aortic valve regurgitation is mild. Aortic regurgitation PHT measures 456 msec. Mild aortic valve annular calcification. Aortic valve mean gradient measures 9.0 mmHg. Aortic valve peak gradient measures 17.6 mmHg. Aortic valve area, by VTI measures 1.98 cm. Pulmonic Valve: The pulmonic valve was grossly normal. Pulmonic valve regurgitation is trivial. Aorta: The aortic root is normal in size and structure. Venous: The inferior vena cava is dilated in size with less than 50% respiratory variability, suggesting right atrial pressure of 15 mmHg. IAS/Shunts: No atrial level shunt detected by color flow Doppler.  LEFT VENTRICLE PLAX 2D LVIDd:         4.00 cm  Diastology LVIDs:         2.60 cm  LV e' lateral:   5.98 cm/s LV PW:         1.60 cm  LV E/e' lateral: 13.8 LV IVS:        2.30 cm  LV e' medial:    6.96 cm/s LVOT diam:     2.10 cm  LV E/e' medial:  11.9 LV SV:         85 LV SV Index:   39 LVOT Area:     3.46 cm  RIGHT VENTRICLE             IVC RV Basal diam:  3.60 cm     IVC diam: 3.00 cm RV S  prime:     13.80 cm/s TAPSE (M-mode): 2.6 cm LEFT ATRIUM             Index       RIGHT ATRIUM           Index LA diam:        4.50 cm 2.06 cm/m  RA Area:     32.50 cm LA Vol (A2C):   56.5 ml 25.81 ml/m RA Volume:   117.00 ml 53.45 ml/m LA Vol (A4C):   80.0 ml 36.55 ml/m LA Biplane Vol: 68.6 ml 31.34 ml/m  AORTIC VALVE AV Area (Vmax):    1.92 cm AV Area (Vmean):   2.09 cm AV Area (VTI):     1.98 cm AV Vmax:           209.67 cm/s AV Vmean:          140.667 cm/s AV VTI:            0.427 m AV Peak Grad:      17.6 mmHg AV Mean Grad:      9.0 mmHg LVOT Vmax:         116.00 cm/s LVOT Vmean:        84.900 cm/s LVOT VTI:          0.244 m LVOT/AV VTI ratio: 0.57 AI PHT:            456 msec  AORTA Ao Root diam: 3.30 cm Ao Asc diam:  3.80 cm MITRAL VALVE               TRICUSPID VALVE MV Area (PHT): 2.34 cm    TR Peak grad:   68.2 mmHg MV Peak  grad:  5.6 mmHg    TR Vmax:        413.00 cm/s MV Mean grad:  2.0 mmHg MV Vmax:       1.18 m/s    SHUNTS MV Vmean:      66.3 cm/s   Systemic VTI:  0.24 m MV Decel Time: 324 msec    Systemic Diam: 2.10 cm MV E velocity: 82.60 cm/s MV A velocity: 96.70 cm/s MV E/A ratio:  0.85 Rozann Lesches MD Electronically signed by Rozann Lesches MD Signature Date/Time: 01/21/2020/4:08:29 PM    Final       Medications:     Scheduled Medications:  allopurinol  200 mg Oral Daily   Ferrous Fumarate  1 tablet Oral Daily   folic acid  1 mg Oral Daily   furosemide  80 mg Intravenous BID   macitentan  10 mg Oral Daily   mometasone-formoterol  2 puff Inhalation BID   multivitamin with minerals  1 tablet Oral Daily   pantoprazole  40 mg Oral Daily   potassium chloride  40 mEq Oral Q4H   rosuvastatin  5 mg Oral Daily   Selexipag  1,000 mcg Oral BID   sodium chloride flush  3 mL Intravenous Q12H   tadalafil  40 mg Oral Daily   thiamine  100 mg Oral Daily     Infusions:  sodium chloride       PRN Medications:  sodium chloride, acetaminophen, albuterol,  hydrOXYzine, ondansetron (ZOFRAN) IV, sodium chloride flush, traZODone   Assessment/Plan   1. RV failure: Patient has RV failure due to portopulmonary hypertension.  Echo this admission with LV EF 60-65%, moderate LVH, severely dilated and severely dysfunctional RV, PASP 83 mmHg.  He has cardiorenal syndrome, recently torsemide was cut back with rise in creatinine to > 3.  Creatinine down with IV diuresis, 2.5 today. Weight down 6 lbs so far with IV Lasix. - Continue Lasix 80 mg IV bid, replace K.  He needs a couple more days of diuresis.  - If renal function worsens, consider milrinone to support RV (but improved today).  2. Pulmonary hypertension: Thought to have portopulmonary hypertension with RV failure.   - Continue home PH meds (Selexipag, tadalafil, and Opsumit).  3. AKI on CKD stage.  Creatinine 2.5 today, down from 2.8 yesterday.  Follow with diuresis, hopefully unloading RV and decreasing renal venous pressure will help with renal function.  4. Cirrhosis: From ETOH.  5. Pancytopenia: This is known from the past.  Thought to be related to cirrhosis/splenic sequestration.  - Follow CBC.  Length of Stay: 2  Loralie Champagne, MD  01/22/2020, 9:32 AM  Advanced Heart Failure Team Pager [phone] (M-F; 7a - 4p)  Please contact [facility] for night-coverage after hours (4p -7a ) and weekends on [email or web address]

## 2020-01-22 NOTE — Progress Notes (Signed)
Per CCMD patient went to 3rd degree HB for few seconds and after that converted back to 1degree HB. Patient nauseous at that time and c/o of abdominal cramping. Per pt he had same episode last night. Medication given( see Epic) for cramping and nausea. EKG done, shows 1 degree HR. Paged MD Cyndia Skeeters, awaiting on phone call. VS stable.

## 2020-01-22 NOTE — Procedures (Signed)
Patient declines CPAP at this time.

## 2020-01-22 NOTE — Progress Notes (Signed)
PROGRESS NOTE  Darren Mueller YTK:[id number] DOB: Jan 01, 1951   PCP: Mosie Lukes, MD  Patient is from: Home.  Independently ambulates at baseline.  DOA: 01/20/2020 LOS: 2  Brief Narrative / Interim history: 69 year old male with history of RV failure/severe P HTN, EtOH cirrhosis, esophageal varices, minimal CADon R/LHC, CKD-3A, COPD, HTN and pancytopenia presented to ED from PCP office with CHF exacerbation.  Heart shortness of breath, increased edema and about 6 pounds weight gain in 1 week.  In ED, HDS and normal saturation on RA.  BNP 506 (dry BNP 158). Cr 2.8 (b/l 2-3).  Hgb 8.8 (baseline).  WBC 1.8 (about baseline). HS trop 33.  EKG sinus rhythm with diffuse T wave changes.  CXR with cardiomegaly and central vascular congestion.  Received IV Lasix 80 mg once, and admitted for CHF exacerbation.  Cardiology (advanced heart failure team) consulted.  Echocardiogram with EF of 60 to 65%, moderate LVH, D-shaped IVS, severe RV dilation and dysfunction, PASP of 83 and severely dilated IVC.   Subjective: Seen and examined earlier this morning.  No major events overnight or this morning other than lack of sleep.  Denies chest pain.  Bruising and swelling improved.  Denies GI or UTI symptoms.  Objective: Vitals:   01/22/20 0500 01/22/20 0728 01/22/20 0906 01/22/20 1205  BP:   104/60 (!) 98/57  Pulse:   79 79  Resp:   18 20  Temp:    (!) 97.5 F (36.4 C)  TempSrc:    Oral  SpO2:  99% 98% 97%  Weight: 93.9 kg     Height:        Intake/Output Summary (Last 24 hours) at 01/22/2020 1325 Last data filed at 01/22/2020 1149 Gross per 24 hour  Intake 870 ml  Output 2800 ml  Net -1930 ml   Filed Weights   01/20/20 1426 01/21/20 0512 01/22/20 0500  Weight: 101.6 kg 96.7 kg 93.9 kg    Examination:  GENERAL: No acute distress.  Appears well.  HEENT: MMM.  Vision and hearing grossly intact.  NECK: Supple.  Prominent JVD. RESP: 99% on RA.  No IWOB.  Fair aeration.  Bibasilar  crackles. CVS:  RRR. Heart sounds normal.  ABD/GI/GU: Bowel sounds present. Soft. Non tender.  MSK/EXT:  Moves extremities. No apparent deformity.  2+ pitting edema bilaterally. SKIN: no apparent skin lesion or wound NEURO: Awake, alert and oriented appropriately.  No apparent focal neuro deficit. PSYCH: Calm. Normal affect.  Procedures:  None  Assessment & Plan: Acute on Chronic diastolic/RV failure/severe PAH-Echo with EF of 60 to 65%, moderate LVH, D-shaped IVS, severe RV dilation and dysfunction, PASP of 83 and severely dilated IVC. RRR/LHC in 2017 with minimal CAD and moderate P HTN. BNP higher than baseline.  CXR consistent with CHF.  Responding to IV Lasix.  About 3.7 L UOP in the last 24 hours.  Weight down 6 pounds so far.  Renal function improved. -Advanced HF team managing-continue IV Lasix 80 mg twice daily -Monitor fluid status, renal function and electrolytes -TED hose for lower extremities -Sodium and fluid restrictions -Continue home PH meds (Selexipag, tadalafil, and Opsumit) per cardiology  Elevated troponin due to demand ischemia in the setting of CHF exacerbation: Pattern not consistent with ACS.  No chest pain. -Manage CHF as above.   AKI on CKD IIIb/azotemia: Cr 2.13 on 3/10> 3.33 on 3/15> 2.80> 2.51.  BUN in 60> 46.  Suspect cardiorenal syndrome in the setting of CHF.  Improving with diuretics. -Diuretics as  PROGRESS NOTE  Darren Mueller YTK:[id number] DOB: Jan 01, 1951   PCP: Mosie Lukes, MD  Patient is from: Home.  Independently ambulates at baseline.  DOA: 01/20/2020 LOS: 2  Brief Narrative / Interim history: 69 year old male with history of RV failure/severe P HTN, EtOH cirrhosis, esophageal varices, minimal CADon R/LHC, CKD-3A, COPD, HTN and pancytopenia presented to ED from PCP office with CHF exacerbation.  Heart shortness of breath, increased edema and about 6 pounds weight gain in 1 week.  In ED, HDS and normal saturation on RA.  BNP 506 (dry BNP 158). Cr 2.8 (b/l 2-3).  Hgb 8.8 (baseline).  WBC 1.8 (about baseline). HS trop 33.  EKG sinus rhythm with diffuse T wave changes.  CXR with cardiomegaly and central vascular congestion.  Received IV Lasix 80 mg once, and admitted for CHF exacerbation.  Cardiology (advanced heart failure team) consulted.  Echocardiogram with EF of 60 to 65%, moderate LVH, D-shaped IVS, severe RV dilation and dysfunction, PASP of 83 and severely dilated IVC.   Subjective: Seen and examined earlier this morning.  No major events overnight or this morning other than lack of sleep.  Denies chest pain.  Bruising and swelling improved.  Denies GI or UTI symptoms.  Objective: Vitals:   01/22/20 0500 01/22/20 0728 01/22/20 0906 01/22/20 1205  BP:   104/60 (!) 98/57  Pulse:   79 79  Resp:   18 20  Temp:    (!) 97.5 F (36.4 C)  TempSrc:    Oral  SpO2:  99% 98% 97%  Weight: 93.9 kg     Height:        Intake/Output Summary (Last 24 hours) at 01/22/2020 1325 Last data filed at 01/22/2020 1149 Gross per 24 hour  Intake 870 ml  Output 2800 ml  Net -1930 ml   Filed Weights   01/20/20 1426 01/21/20 0512 01/22/20 0500  Weight: 101.6 kg 96.7 kg 93.9 kg    Examination:  GENERAL: No acute distress.  Appears well.  HEENT: MMM.  Vision and hearing grossly intact.  NECK: Supple.  Prominent JVD. RESP: 99% on RA.  No IWOB.  Fair aeration.  Bibasilar  crackles. CVS:  RRR. Heart sounds normal.  ABD/GI/GU: Bowel sounds present. Soft. Non tender.  MSK/EXT:  Moves extremities. No apparent deformity.  2+ pitting edema bilaterally. SKIN: no apparent skin lesion or wound NEURO: Awake, alert and oriented appropriately.  No apparent focal neuro deficit. PSYCH: Calm. Normal affect.  Procedures:  None  Assessment & Plan: Acute on Chronic diastolic/RV failure/severe PAH-Echo with EF of 60 to 65%, moderate LVH, D-shaped IVS, severe RV dilation and dysfunction, PASP of 83 and severely dilated IVC. RRR/LHC in 2017 with minimal CAD and moderate P HTN. BNP higher than baseline.  CXR consistent with CHF.  Responding to IV Lasix.  About 3.7 L UOP in the last 24 hours.  Weight down 6 pounds so far.  Renal function improved. -Advanced HF team managing-continue IV Lasix 80 mg twice daily -Monitor fluid status, renal function and electrolytes -TED hose for lower extremities -Sodium and fluid restrictions -Continue home PH meds (Selexipag, tadalafil, and Opsumit) per cardiology  Elevated troponin due to demand ischemia in the setting of CHF exacerbation: Pattern not consistent with ACS.  No chest pain. -Manage CHF as above.   AKI on CKD IIIb/azotemia: Cr 2.13 on 3/10> 3.33 on 3/15> 2.80> 2.51.  BUN in 60> 46.  Suspect cardiorenal syndrome in the setting of CHF.  Improving with diuretics. -Diuretics as  PROGRESS NOTE  Darren Mueller YTK:[id number] DOB: Jan 01, 1951   PCP: Mosie Lukes, MD  Patient is from: Home.  Independently ambulates at baseline.  DOA: 01/20/2020 LOS: 2  Brief Narrative / Interim history: 69 year old male with history of RV failure/severe P HTN, EtOH cirrhosis, esophageal varices, minimal CADon R/LHC, CKD-3A, COPD, HTN and pancytopenia presented to ED from PCP office with CHF exacerbation.  Heart shortness of breath, increased edema and about 6 pounds weight gain in 1 week.  In ED, HDS and normal saturation on RA.  BNP 506 (dry BNP 158). Cr 2.8 (b/l 2-3).  Hgb 8.8 (baseline).  WBC 1.8 (about baseline). HS trop 33.  EKG sinus rhythm with diffuse T wave changes.  CXR with cardiomegaly and central vascular congestion.  Received IV Lasix 80 mg once, and admitted for CHF exacerbation.  Cardiology (advanced heart failure team) consulted.  Echocardiogram with EF of 60 to 65%, moderate LVH, D-shaped IVS, severe RV dilation and dysfunction, PASP of 83 and severely dilated IVC.   Subjective: Seen and examined earlier this morning.  No major events overnight or this morning other than lack of sleep.  Denies chest pain.  Bruising and swelling improved.  Denies GI or UTI symptoms.  Objective: Vitals:   01/22/20 0500 01/22/20 0728 01/22/20 0906 01/22/20 1205  BP:   104/60 (!) 98/57  Pulse:   79 79  Resp:   18 20  Temp:    (!) 97.5 F (36.4 C)  TempSrc:    Oral  SpO2:  99% 98% 97%  Weight: 93.9 kg     Height:        Intake/Output Summary (Last 24 hours) at 01/22/2020 1325 Last data filed at 01/22/2020 1149 Gross per 24 hour  Intake 870 ml  Output 2800 ml  Net -1930 ml   Filed Weights   01/20/20 1426 01/21/20 0512 01/22/20 0500  Weight: 101.6 kg 96.7 kg 93.9 kg    Examination:  GENERAL: No acute distress.  Appears well.  HEENT: MMM.  Vision and hearing grossly intact.  NECK: Supple.  Prominent JVD. RESP: 99% on RA.  No IWOB.  Fair aeration.  Bibasilar  crackles. CVS:  RRR. Heart sounds normal.  ABD/GI/GU: Bowel sounds present. Soft. Non tender.  MSK/EXT:  Moves extremities. No apparent deformity.  2+ pitting edema bilaterally. SKIN: no apparent skin lesion or wound NEURO: Awake, alert and oriented appropriately.  No apparent focal neuro deficit. PSYCH: Calm. Normal affect.  Procedures:  None  Assessment & Plan: Acute on Chronic diastolic/RV failure/severe PAH-Echo with EF of 60 to 65%, moderate LVH, D-shaped IVS, severe RV dilation and dysfunction, PASP of 83 and severely dilated IVC. RRR/LHC in 2017 with minimal CAD and moderate P HTN. BNP higher than baseline.  CXR consistent with CHF.  Responding to IV Lasix.  About 3.7 L UOP in the last 24 hours.  Weight down 6 pounds so far.  Renal function improved. -Advanced HF team managing-continue IV Lasix 80 mg twice daily -Monitor fluid status, renal function and electrolytes -TED hose for lower extremities -Sodium and fluid restrictions -Continue home PH meds (Selexipag, tadalafil, and Opsumit) per cardiology  Elevated troponin due to demand ischemia in the setting of CHF exacerbation: Pattern not consistent with ACS.  No chest pain. -Manage CHF as above.   AKI on CKD IIIb/azotemia: Cr 2.13 on 3/10> 3.33 on 3/15> 2.80> 2.51.  BUN in 60> 46.  Suspect cardiorenal syndrome in the setting of CHF.  Improving with diuretics. -Diuretics as  PROGRESS NOTE  Darren Mueller YTK:[id number] DOB: Jan 01, 1951   PCP: Mosie Lukes, MD  Patient is from: Home.  Independently ambulates at baseline.  DOA: 01/20/2020 LOS: 2  Brief Narrative / Interim history: 69 year old male with history of RV failure/severe P HTN, EtOH cirrhosis, esophageal varices, minimal CADon R/LHC, CKD-3A, COPD, HTN and pancytopenia presented to ED from PCP office with CHF exacerbation.  Heart shortness of breath, increased edema and about 6 pounds weight gain in 1 week.  In ED, HDS and normal saturation on RA.  BNP 506 (dry BNP 158). Cr 2.8 (b/l 2-3).  Hgb 8.8 (baseline).  WBC 1.8 (about baseline). HS trop 33.  EKG sinus rhythm with diffuse T wave changes.  CXR with cardiomegaly and central vascular congestion.  Received IV Lasix 80 mg once, and admitted for CHF exacerbation.  Cardiology (advanced heart failure team) consulted.  Echocardiogram with EF of 60 to 65%, moderate LVH, D-shaped IVS, severe RV dilation and dysfunction, PASP of 83 and severely dilated IVC.   Subjective: Seen and examined earlier this morning.  No major events overnight or this morning other than lack of sleep.  Denies chest pain.  Bruising and swelling improved.  Denies GI or UTI symptoms.  Objective: Vitals:   01/22/20 0500 01/22/20 0728 01/22/20 0906 01/22/20 1205  BP:   104/60 (!) 98/57  Pulse:   79 79  Resp:   18 20  Temp:    (!) 97.5 F (36.4 C)  TempSrc:    Oral  SpO2:  99% 98% 97%  Weight: 93.9 kg     Height:        Intake/Output Summary (Last 24 hours) at 01/22/2020 1325 Last data filed at 01/22/2020 1149 Gross per 24 hour  Intake 870 ml  Output 2800 ml  Net -1930 ml   Filed Weights   01/20/20 1426 01/21/20 0512 01/22/20 0500  Weight: 101.6 kg 96.7 kg 93.9 kg    Examination:  GENERAL: No acute distress.  Appears well.  HEENT: MMM.  Vision and hearing grossly intact.  NECK: Supple.  Prominent JVD. RESP: 99% on RA.  No IWOB.  Fair aeration.  Bibasilar  crackles. CVS:  RRR. Heart sounds normal.  ABD/GI/GU: Bowel sounds present. Soft. Non tender.  MSK/EXT:  Moves extremities. No apparent deformity.  2+ pitting edema bilaterally. SKIN: no apparent skin lesion or wound NEURO: Awake, alert and oriented appropriately.  No apparent focal neuro deficit. PSYCH: Calm. Normal affect.  Procedures:  None  Assessment & Plan: Acute on Chronic diastolic/RV failure/severe PAH-Echo with EF of 60 to 65%, moderate LVH, D-shaped IVS, severe RV dilation and dysfunction, PASP of 83 and severely dilated IVC. RRR/LHC in 2017 with minimal CAD and moderate P HTN. BNP higher than baseline.  CXR consistent with CHF.  Responding to IV Lasix.  About 3.7 L UOP in the last 24 hours.  Weight down 6 pounds so far.  Renal function improved. -Advanced HF team managing-continue IV Lasix 80 mg twice daily -Monitor fluid status, renal function and electrolytes -TED hose for lower extremities -Sodium and fluid restrictions -Continue home PH meds (Selexipag, tadalafil, and Opsumit) per cardiology  Elevated troponin due to demand ischemia in the setting of CHF exacerbation: Pattern not consistent with ACS.  No chest pain. -Manage CHF as above.   AKI on CKD IIIb/azotemia: Cr 2.13 on 3/10> 3.33 on 3/15> 2.80> 2.51.  BUN in 60> 46.  Suspect cardiorenal syndrome in the setting of CHF.  Improving with diuretics. -Diuretics as  PROBNP 604.0* 710.0* 494.0*   Coagulation Profile: No results for input(s): INR, PROTIME in the last 168 hours. Thyroid Function Tests: No results for input(s): TSH, T4TOTAL, FREET4, T3FREE, THYROIDAB in the last 72 hours. Lipid Profile: No results for input(s): CHOL, HDL, LDLCALC, TRIG, CHOLHDL, LDLDIRECT in the last 72 hours. Anemia Panel: Recent Labs    01/21/20 0841  VITAMINB12 618  FOLATE 46.8  FERRITIN 25  TIBC 332  IRON 54  RETICCTPCT 1.8   Urine analysis:    Component Value Date/Time   COLORURINE YELLOW 11/18/2017 0250   APPEARANCEUR CLEAR 11/18/2017 0250   LABSPEC 1.008 11/18/2017 0250   PHURINE 5.0 11/18/2017 0250   GLUCOSEU NEGATIVE 11/18/2017 0250   HGBUR NEGATIVE 11/18/2017 0250   BILIRUBINUR neg 12/18/2017 1352   KETONESUR NEGATIVE 11/18/2017 0250   PROTEINUR net 12/18/2017 1352   PROTEINUR NEGATIVE 11/18/2017 0250   UROBILINOGEN 0.2 12/18/2017 1352   UROBILINOGEN 0.2 10/18/2013 1247   NITRITE neg 12/18/2017 1352   NITRITE NEGATIVE 11/18/2017 0250   LEUKOCYTESUR Negative 12/18/2017 1352   Sepsis Labs: Invalid input(s): PROCALCITONIN, [place]  Microbiology: Recent Results (from the past 240 hour(s))  SARS CORONAVIRUS 2 (TAT 6-24 HRS) Nasopharyngeal Nasopharyngeal Swab     Status: None   Collection Time: 01/20/20  7:29 PM   Specimen: Nasopharyngeal Swab  Result Value Ref Range  Status   SARS Coronavirus 2 NEGATIVE NEGATIVE Final    Comment: (NOTE) SARS-CoV-2 target nucleic acids are NOT DETECTED. The SARS-CoV-2 RNA is generally detectable in upper and lower respiratory specimens during the acute phase of infection. Negative results do not preclude SARS-CoV-2 infection, do not rule out co-infections with other pathogens, and should not be used as the sole basis for treatment or other patient management decisions. Negative results must be combined with clinical observations, patient history, and epidemiological information. The expected result is Negative. Fact Sheet for Patients: SugarRoll.be Fact Sheet for Healthcare Providers: https://www.woods-mathews.com/ This test is not yet approved or cleared by the [place] FDA and  has been authorized for detection and/or diagnosis of SARS-CoV-2 by FDA under an Emergency Use Authorization (EUA). This EUA will remain  in effect (meaning this test can be used) for the duration of the COVID-19 declaration under Section 56 4(b)(1) of the Act, 21 U.S.C. section 360bbb-3(b)(1), unless the authorization is terminated or revoked sooner. Performed at [facility], [place] [address] [place], [place] [address]     Radiology Studies: ECHOCARDIOGRAM COMPLETE  Result Date: 01/21/2020    ECHOCARDIOGRAM REPORT   Patient Name:   Darren Mueller Date of Exam: 01/21/2020 Medical Rec #:  [id number]         Height:       72.0 in Accession #:    [id number]        Weight:       213.1 lb Date of Birth:  January 04, 1951          BSA:          2.189 m Patient Age:    56 years          BP:           109/65 mmHg Patient Gender: M                 HR:           70 bpm. Exam Location:  Inpatient Procedure: 2D Echo, Cardiac Doppler and Color Doppler Indications:    Abnormal ECG 794.31/R94.31  History:

## 2020-01-22 NOTE — Progress Notes (Signed)
Per MD keep observing the patient.

## 2020-01-22 NOTE — Progress Notes (Signed)
Pt given oxy at [facility] [place], still c/o of severe headache that not relived and getting worse 10/10. Paged APP [place] on call for another PRN.

## 2020-01-23 ENCOUNTER — Inpatient Hospital Stay (HOSPITAL_COMMUNITY): Payer: Medicare Other

## 2020-01-23 DIAGNOSIS — N189 Chronic kidney disease, unspecified: Secondary | ICD-10-CM

## 2020-01-23 DIAGNOSIS — K7030 Alcoholic cirrhosis of liver without ascites: Secondary | ICD-10-CM

## 2020-01-23 DIAGNOSIS — D61818 Other pancytopenia: Secondary | ICD-10-CM

## 2020-01-23 DIAGNOSIS — I2721 Secondary pulmonary arterial hypertension: Secondary | ICD-10-CM

## 2020-01-23 DIAGNOSIS — N179 Acute kidney failure, unspecified: Secondary | ICD-10-CM

## 2020-01-23 DIAGNOSIS — E876 Hypokalemia: Secondary | ICD-10-CM

## 2020-01-23 DIAGNOSIS — G4733 Obstructive sleep apnea (adult) (pediatric): Secondary | ICD-10-CM

## 2020-01-23 DIAGNOSIS — R1011 Right upper quadrant pain: Secondary | ICD-10-CM

## 2020-01-23 DIAGNOSIS — I248 Other forms of acute ischemic heart disease: Secondary | ICD-10-CM

## 2020-01-23 DIAGNOSIS — G4700 Insomnia, unspecified: Secondary | ICD-10-CM

## 2020-01-23 DIAGNOSIS — G43019 Migraine without aura, intractable, without status migrainosus: Secondary | ICD-10-CM

## 2020-01-23 DIAGNOSIS — I5033 Acute on chronic diastolic (congestive) heart failure: Secondary | ICD-10-CM

## 2020-01-23 DIAGNOSIS — R778 Other specified abnormalities of plasma proteins: Secondary | ICD-10-CM

## 2020-01-23 DIAGNOSIS — J449 Chronic obstructive pulmonary disease, unspecified: Secondary | ICD-10-CM

## 2020-01-23 LAB — RENAL FUNCTION PANEL
Albumin: 2.6 g/dL — ABNORMAL LOW (ref 3.5–5.0)
Anion gap: 10 (ref 5–15)
BUN: 42 mg/dL — ABNORMAL HIGH (ref 8–23)
CO2: 24 mmol/L (ref 22–32)
Calcium: 8.6 mg/dL — ABNORMAL LOW (ref 8.9–10.3)
Chloride: 106 mmol/L (ref 98–111)
Creatinine, Ser: 2.55 mg/dL — ABNORMAL HIGH (ref 0.61–1.24)
GFR calc Af Amer: 29 mL/min — ABNORMAL LOW (ref >60)
GFR calc non Af Amer: 25 mL/min — ABNORMAL LOW (ref >60)
Glucose, Bld: 114 mg/dL — ABNORMAL HIGH (ref 70–99)
Phosphorus: 3.5 mg/dL (ref 2.5–4.6)
Potassium: 3.7 mmol/L (ref 3.5–5.1)
Sodium: 140 mmol/L (ref 135–145)

## 2020-01-23 LAB — MAGNESIUM: Magnesium: 1.9 mg/dL (ref 1.7–2.4)

## 2020-01-23 MED ORDER — SUMATRIPTAN SUCCINATE 6 MG/0.5ML ~~LOC~~ SOLN
6.0000 mg | Freq: Once | SUBCUTANEOUS | Status: AC
  Administered 2020-01-23: 09:00:00 6 mg via SUBCUTANEOUS
  Filled 2020-01-23: qty 0.5

## 2020-01-23 MED ORDER — MAGNESIUM SULFATE 2 GM/50ML IV SOLN: 2.0000 g | Freq: Once | INTRAVENOUS | Status: DC

## 2020-01-23 MED ORDER — POTASSIUM CHLORIDE CRYS ER 20 MEQ PO TBCR
40.0000 meq | EXTENDED_RELEASE_TABLET | Freq: Once | ORAL | Status: AC
  Administered 2020-01-23: 08:00:00 40 meq via ORAL
  Filled 2020-01-23: qty 2

## 2020-01-23 MED ORDER — MAGNESIUM SULFATE IN D5W 1-5 GM/100ML-% IV SOLN
1.0000 g | Freq: Once | INTRAVENOUS | Status: AC
  Administered 2020-01-23: 09:00:00 1 g via INTRAVENOUS
  Filled 2020-01-23: qty 100

## 2020-01-23 MED ORDER — POTASSIUM CHLORIDE CRYS ER 20 MEQ PO TBCR
40.0000 meq | EXTENDED_RELEASE_TABLET | Freq: Two times a day (BID) | ORAL | Status: DC
  Administered 2020-01-23 – 2020-01-25 (×4): 40 meq via ORAL
  Filled 2020-01-23 (×4): qty 2

## 2020-01-23 MED ORDER — SODIUM CHLORIDE 0.9 % IV SOLN
510.0000 mg | Freq: Once | INTRAVENOUS | Status: AC
  Administered 2020-01-23: 12:00:00 510 mg via INTRAVENOUS
  Filled 2020-01-23: qty 17

## 2020-01-23 MED ORDER — METOCLOPRAMIDE HCL 5 MG/ML IJ SOLN
10.0000 mg | Freq: Once | INTRAMUSCULAR | Status: AC
  Administered 2020-01-23: 19:00:00 10 mg via INTRAVENOUS
  Filled 2020-01-23: qty 2

## 2020-01-23 MED ORDER — DEXAMETHASONE SODIUM PHOSPHATE 10 MG/ML IJ SOLN
10.0000 mg | Freq: Once | INTRAMUSCULAR | Status: AC
  Administered 2020-01-23: 19:00:00 10 mg via INTRAVENOUS
  Filled 2020-01-23: qty 1

## 2020-01-23 MED ORDER — DIPHENHYDRAMINE HCL 50 MG/ML IJ SOLN
12.5000 mg | Freq: Once | INTRAMUSCULAR | Status: AC
  Administered 2020-01-23: 19:00:00 12.5 mg via INTRAVENOUS
  Filled 2020-01-23: qty 1

## 2020-01-23 NOTE — Care Management Important Message (Signed)
Important Message  Patient Details  Name: IVIE MAESE MRN: [id number] Date of Birth: 25-Mar-1951   Medicare Important Message Given:  Yes     Shelda Altes 01/23/2020, 12:13 PM

## 2020-01-23 NOTE — Progress Notes (Signed)
Physical Therapy Treatment Patient Details Name: Darren Mueller MRN: [id number] DOB: 1950-11-15 Today's Date: 01/23/2020    History of Present Illness 69 y.o. male with PMH significant for portopulmonary hypertension with RV failure, ETOH cirrhosis,Grade 1, small esophageal varices, CAD: R/L cath 6/17 noting minimal CAD with moderate PAH and preserved cardiac output, CKDIIIa, COPD,  HTN, HLD, vertigo, seizures, gout. herat murmur and pancytopenia who presents to the ED in referral from pcp due concern for CHF exacerbation with  history of worsening shortness of breath, and increase lower extremity swelling. Patient notes associated weight gain of around 6lb over the last one week and dry cough.     PT Comments    Patient progressing well towards PT goals. Improved ambulation distance with supervision for safety. Pt with very slow gait speed and noted to have 2/4 DOE. Sp02 remained >89% on RA. Pt reports he has been walking to/from the bathroom. Encouraged hallway ambulation as well to improve endurance/strength. Pt's only complaint today is headache which pain medicine is not helping. Will follow.   Follow Up Recommendations  No PT follow up     Equipment Recommendations  None recommended by PT    Recommendations for Other Services       Precautions / Restrictions Precautions Precautions: None Restrictions Weight Bearing Restrictions: No    Mobility  Bed Mobility Overal bed mobility: Modified Independent             General bed mobility comments: HOB elevated but no assist required to EOB   Transfers Overall transfer level: Modified independent Equipment used: None Transfers: Sit to/from Stand Sit to Stand: Modified independent (Device/Increase time)         General transfer comment: Stood from EOB without difficulty.  Ambulation/Gait Ambulation/Gait assistance: Supervision Gait Distance (Feet): 125 Feet Assistive device: None Gait Pattern/deviations: Wide  base of support;Step-through pattern;Decreased step length - right;Decreased step length - left Gait velocity: very slow   General Gait Details: very slow, mostly steady gait; 2/4 DOE. Sp02 >89% on RA. Reaching for rail when short of breath.   Stairs             Wheelchair Mobility    Modified Rankin (Stroke Patients Only)       Balance Overall balance assessment: Mild deficits observed, not formally tested                                          Cognition Arousal/Alertness: Awake/alert Behavior During Therapy: WFL for tasks assessed/performed Overall Cognitive Status: Within Functional Limits for tasks assessed                                        Exercises      General Comments General comments (skin integrity, edema, etc.): Sp02 >89% on RA      Pertinent Vitals/Pain Pain Assessment: 0-10 Pain Score: 10-Worst pain ever Pain Location: headache Pain Descriptors / Indicators: Headache Pain Intervention(s): Monitored during session    Home Living                      Prior Function            PT Goals (current goals can now be found in the care plan section) Progress towards PT goals: Progressing toward goals  Frequency    Min 3X/week      PT Plan Current plan remains appropriate    Co-evaluation              AM-PAC PT "6 Clicks" Mobility   Outcome Measure  Help needed turning from your back to your side while in a flat bed without using bedrails?: None Help needed moving from lying on your back to sitting on the side of a flat bed without using bedrails?: None Help needed moving to and from a bed to a chair (including a wheelchair)?: None Help needed standing up from a chair using your arms (e.g., wheelchair or bedside chair)?: None Help needed to walk in hospital room?: A Little Help needed climbing 3-5 steps with a railing? : A Little 6 Click Score: 22    End of Session   Activity  Tolerance: Patient tolerated treatment well Patient left: in bed;with call bell/phone within reach Nurse Communication: Mobility status PT Visit Diagnosis: Unsteadiness on feet (R26.81);Other abnormalities of gait and mobility (R26.89)     Time: 9622-2979 PT Time Calculation (min) (ACUTE ONLY): 11 min  Charges:  $Therapeutic Exercise: 8-22 mins                     Marisa Severin, PT, DPT Acute Rehabilitation Services Pager [phone] Office [phone]       Marguarite Arbour A Sabra Heck 01/23/2020, 12:31 PM

## 2020-01-23 NOTE — Procedures (Signed)
Patient declined CPAP for tonight.

## 2020-01-23 NOTE — Progress Notes (Signed)
Occupational Therapy Treatment Patient Details Name: Darren Mueller MRN: [id number] DOB: 09/14/1951 Today's Date: 01/23/2020    History of present illness 69 y.o. male with PMH significant for portopulmonary hypertension with RV failure, ETOH cirrhosis,Grade 1, small esophageal varices, CAD: R/L cath 6/17 noting minimal CAD with moderate PAH and preserved cardiac output, CKDIIIa, COPD,  HTN, HLD, vertigo, seizures, gout. herat murmur and pancytopenia who presents to the ED in referral from pcp due concern for CHF exacerbation with  history of worsening shortness of breath, and increase lower extremity swelling. Patient notes associated weight gain of around 6lb over the last one week and dry cough.    OT comments  Pt requires min - mod A for LB ADLs as he is unable to access feet due to edema.  He is making slow, but steady progress, but fatigues quickly with activity, and moves very slowly - was able to ambulate ~150' with two rest breaks.  Discussed need for shower seat to conserve energy.  Will continue to follow.   Follow Up Recommendations  No OT follow up;Supervision - Intermittent    Equipment Recommendations  3 in 1 bedside commode    Recommendations for Other Services      Precautions / Restrictions Precautions Precautions: None Restrictions Weight Bearing Restrictions: No       Mobility Bed Mobility Overal bed mobility: Modified Independent             General bed mobility comments: HOB elevated but no assist required to EOB   Transfers Overall transfer level: Modified independent Equipment used: None Transfers: Sit to/from Stand Sit to Stand: Modified independent (Device/Increase time)         General transfer comment: Stood from EOB without difficulty.    Balance Overall balance assessment: Mild deficits observed, not formally tested                                         ADL either performed or assessed with clinical judgement    ADL Overall ADL's : Needs assistance/impaired             Lower Body Bathing: Minimal assistance;Sit to/from stand       Lower Body Dressing: Moderate assistance;Sit to/from stand Lower Body Dressing Details (indicate cue type and reason): unable to perform figure four, but progressing toward that.  He reports he could perform this PTA, and is hopeful once edema decreases this won't be difficult  Toilet Transfer: Ambulation;Comfort height toilet;Modified Independent   Toileting- Clothing Manipulation and Hygiene: Sit to/from stand;Modified independent       Functional mobility during ADLs: Modified independent General ADL Comments: pt moves very slowly and is guarded.  He required 2 standing rest breaks      Vision       Perception     Praxis      Cognition Arousal/Alertness: Awake/alert Behavior During Therapy: WFL for tasks assessed/performed Overall Cognitive Status: Within Functional Limits for tasks assessed                                          Exercises     Shoulder Instructions       General Comments DOE 3/4 on RA with activity.    Pertinent Vitals/ Pain       Pain  Assessment: Faces Pain Score: 10-Worst pain ever Faces Pain Scale: Hurts little more Pain Location: headache Pain Descriptors / Indicators: Headache Pain Intervention(s): Monitored during session  Home Living                                          Prior Functioning/Environment              Frequency  Min 2X/week        Progress Toward Goals  OT Goals(current goals can now be found in the care plan section)  Progress towards OT goals: Progressing toward goals     Plan Discharge plan remains appropriate    Co-evaluation                 AM-PAC OT "6 Clicks" Daily Activity     Outcome Measure   Help from another person eating meals?: None Help from another person taking care of personal grooming?: None Help from  another person toileting, which includes using toliet, bedpan, or urinal?: None Help from another person bathing (including washing, rinsing, drying)?: A Little Help from another person to put on and taking off regular upper body clothing?: None Help from another person to put on and taking off regular lower body clothing?: A Little 6 Click Score: 22    End of Session    OT Visit Diagnosis: Other abnormalities of gait and mobility (R26.89);Other (comment)   Activity Tolerance Patient limited by fatigue   Patient Left in bed;with call bell/phone within reach   Nurse Communication Mobility status        Time: 1419-1430 OT Time Calculation (min): 11 min  Charges: OT General Charges $OT Visit: 1 Visit OT Treatments $Therapeutic Activity: 8-22 mins  Nilsa Nutting OTR/L Acute Rehabilitation Services Pager [phone] Office [phone]    Lucille Passy M 01/23/2020, 3:11 PM

## 2020-01-23 NOTE — TOC Initial Note (Signed)
Transition of Care [facility]) - Initial/Assessment Note    Patient Details  Name: Darren Mueller MRN: [id number] Date of Birth: 1951-06-29  Transition of Care [facility]) CM/SW Contact:    Zenon Mayo, RN Phone Number: 01/23/2020, 12:21 PM  Clinical Narrative:                 NCM spoke with patient, asked if he would like to be set up with a [facility] for CHF disease management, he states yes.  NCM offered choice with medicare . gov list, patient chose [facility] from list. NCM made referral to [place] with [facility]. She was able to take referral.  Soc will begin 24 to 48 hrs post dc. He states his son will transport him home at discharge.    Expected Discharge Plan: [place] Barriers to Discharge: No Barriers Identified   Patient Goals and CMS Choice Patient states their goals for this hospitalization and ongoing recovery are:: get better CMS Medicare.gov Compare Post Acute Care list provided to:: Patient Choice offered to / list presented to : NA  Expected Discharge Plan and Services Expected Discharge Plan: [place] In-house Referral: NA Discharge Planning Services: CM Consult Post Acute Care Choice: [place] arrangements for the past 2 months: Single Family Home                 DME Arranged: (NA)         HH Arranged: RN, Disease Management [place] Agency: [facility] Date [place] Agency Contacted: 01/23/20 Time HH Agency Contacted: 1220 Representative spoke with at [place]: [place] Arrangements/Services Living arrangements for the past 2 months: [place] with:: Adult Children Patient language and need for interpreter reviewed:: Yes Do you feel safe going back to the place where you live?: Yes      Need for Family Participation in Patient Care: Yes (Comment) Care giver support system in place?: Yes (comment)   Criminal Activity/Legal Involvement Pertinent to Current Situation/Hospitalization:  No - Comment as needed  Activities of Daily Living Home Assistive Devices/Equipment: Blood pressure cuff, Eyeglasses, Scales ADL Screening (condition at time of admission) Patient's cognitive ability adequate to safely complete daily activities?: Yes Is the patient deaf or have difficulty hearing?: No Does the patient have difficulty seeing, even when wearing glasses/contacts?: No Does the patient have difficulty concentrating, remembering, or making decisions?: No Patient able to express need for assistance with ADLs?: Yes Does the patient have difficulty dressing or bathing?: No Independently performs ADLs?: Yes (appropriate for developmental age) Does the patient have difficulty walking or climbing stairs?: No Weakness of Legs: None Weakness of Arms/Hands: None  Permission Sought/Granted                  Emotional Assessment Appearance:: Appears stated age Attitude/Demeanor/Rapport: Engaged Affect (typically observed): Appropriate Orientation: : Oriented to Self, Oriented to Place, Oriented to  Time, Oriented to Situation Alcohol / Substance Use: Not Applicable Psych Involvement: No (comment)  Admission diagnosis:  Shortness of breath [R06.02] CHF (congestive heart failure) ([place]) [I50.9] Patient Active Problem List   Diagnosis Date Noted  . CHF (congestive heart failure) ([place]) 01/20/2020  . Abdominal pain 08/23/2019  . Grief 08/23/2019  . Acute on chronic diastolic heart failure ([place]) 05/16/2019  . Acute on chronic diastolic (congestive) heart failure ([place]) 05/16/2019  . Calf pain 04/03/2019  . History of colonic polyps   . Polyp of cecum   . Headache  02/21/2019  . Rectal bleeding 02/13/2019  . COPD (chronic obstructive pulmonary disease) ([place]) 02/13/2019  . Acute on chronic right heart failure ([place]) 02/13/2019  . Hematochezia   . Ascites   . Dark stools   . Alcoholism ([place])   . Hyperglycemia 05/04/2018  . Seizure disorder ([place]) 02/01/2018  . Pedal edema  10/01/2017  . RUQ pain 05/06/2017  . Decreased range of motion of left elbow 04/15/2017  . Hyperlipidemia 03/17/2017  . Elbow pain, left 03/17/2017  . Right rib fracture   . Syncope 07/19/2016  . Thrombocytopenia ([place]) 06/29/2016  . Hoarseness, chronic 06/29/2016  . Preventative health care 06/29/2016  . Chest pain   . Low back pain 12/09/2015  . Cirrhosis with alcoholism ([place]) 08/08/2015  . Pancytopenia ([place]) 06/10/2015  . Dermatitis 06/10/2015  . OSA (obstructive sleep apnea) 02/13/2015  . Gout 07/30/2014  . Diarrhea 09/04/2013  . Chronic renal insufficiency, stage III (moderate) ([place]) 07/18/2013  . Right heart failure due to pulmonary hypertension ([place]) 06/06/2013  . Hypothyroidism 01/25/2013  . PAH (pulmonary arterial hypertension) with portal hypertension ([place]) 01/21/2013  . Allergic rhinitis 12/21/2012  . Secondary pulmonary hypertension 12/09/2012  . Hepatic cirrhosis ([place]) 11/26/2012  . GERD (gastroesophageal reflux disease) 09/05/2011  . Hypokalemia 08/29/2011  . Colon polyps 03/26/2011  . Insomnia 03/26/2011  . Hypertension 03/22/2011  . Alcohol dependence ([place]) 03/22/2011   PCP:  Mosie Lukes, MD Pharmacy:   [place], [address] [place] [address] [place] [address] Phone: [phone] Fax: [phone]  [facility] [place], [place] [address] [place] [address] Phone: [phone] Fax: [phone]  [place], [place] [address] [place] [address] Phone: [phone] Fax: [place], [place] [address] Phone: [phone] Fax: [phone]     Social Determinants of Health (SDOH) Interventions    Readmission Risk Interventions Readmission Risk Prevention Plan  01/23/2020 05/17/2019 02/14/2019  Transportation Screening Complete Complete Complete  PCP or Specialist Appt within 3-5 Days Complete Complete Not Complete  Not Complete comments - - CM had to leave VM for office staff - requesting appt  [place] or [place] Complete Complete Complete  Social Work Consult for [place] Planning/Counseling Complete Complete Complete  Palliative Care Screening Not Applicable Not Applicable Not Applicable  Medication Review (RN Care Manager) Complete Referral to Pharmacy Referral to Pharmacy  Some recent data might be hidden

## 2020-01-23 NOTE — Progress Notes (Addendum)
Patient ID: Darren Mueller, male   DOB: 1951/06/09, 69 y.o.   MRN: [id number]     Advanced Heart Failure Rounding Note  PCP-Cardiologist: Darren Bickers, MD   Subjective:    Responding well to IV diuretics. -1.4L out yesterday. Down another 5 lb. SCr stable at 2.5. No CV access for CVP monitoring.   Feels ok this morning. No resting dyspnea. Hasn't ambulated much. Currently getting IV Fe.    Complains of HA this morning.   Echo: EF 60-65%, moderate LVH.  D-shaped interventricular septum, RV severely dilated/severely dysfunctional.  IVC dilated. PASP 83 mmHg.    Objective:   Weight Range: 91.9 kg Body mass index is 27.49 kg/m.   Vital Signs:   Temp:  [97.5 F (36.4 C)-98.1 F (36.7 C)] 97.9 F (36.6 C) (03/22 0855) Pulse Rate:  [74-82] 76 (03/22 0855) Resp:  [16-20] 16 (03/22 0855) BP: (96-116)/(55-76) 103/66 (03/22 0855) SpO2:  [95 %-99 %] 98 % (03/22 0855) Weight:  [91.9 kg] 91.9 kg (03/22 0559) Last BM Date: 01/21/20  Weight change: Filed Weights   01/21/20 0512 01/22/20 0500 01/23/20 0559  Weight: 96.7 kg 93.9 kg 91.9 kg    Intake/Output:   Intake/Output Summary (Last 24 hours) at 01/23/2020 1137 Last data filed at 01/23/2020 0839 Gross per 24 hour  Intake 830 ml  Output 1100 ml  Net -270 ml      Physical Exam    PHYSICAL EXAM: General:  Well appearing, elderly AAM. No respiratory difficulty HEENT: normal anicteric  Neck: supple. elevated JVD to jaw line with prominent CV waves . Carotids 2+ bilat; no bruits. No lymphadenopathy or thyromegaly appreciated. Cor: PMI nondisplaced. Regular rate & rhythm. 2/6 HSM at apex. 2/6 TR  Loud P2.  Lungs: clear no wheeze  Abdomen: soft, nontender, nondistended. No hepatosplenomegaly. No bruits or masses. Good bowel sounds.  Extremities: no cyanosis, clubbing, rash, trace-1+ bilateral LE edema Neuro: alert & oriented x 3, cranial nerves grossly intact. moves all 4 extremities w/o difficulty. Affect  pleasant  Telemetry   NSR 70-80s, Personally reviewed  Labs    CBC Recent Labs    01/20/20 1503  WBC 1.8*  NEUTROABS 1.0*  HGB 8.8*  HCT 29.0*  MCV 107.4*  PLT 30*   Basic Metabolic Panel Recent Labs    01/22/20 0428 01/22/20 0428 01/22/20 1953 01/23/20 0454  NA 138   < > 139 140  K 3.3*   < > 3.8 3.7  CL 103   < > 106 106  CO2 23   < > 23 24  GLUCOSE 118*   < > 131* 114*  BUN 46*   < > 43* 42*  CREATININE 2.51*   < > 2.47* 2.55*  CALCIUM 8.4*   < > 8.6* 8.6*  MG 2.0   < > 2.0 1.9  PHOS 2.7  --   --  3.5   < > = values in this interval not displayed.   Liver Function Tests Recent Labs    01/20/20 1503 01/22/20 0428 01/22/20 1953 01/23/20 0454  AST 45*  --  46*  --   ALT 12  --  14  --   ALKPHOS 418*  --  381*  --   BILITOT 1.9*  --  2.3*  --   PROT 7.1  --  6.7  --   ALBUMIN 3.0*   < > 2.7* 2.6*   < > = values in this interval not displayed.   Recent Labs  01/22/20 1953  LIPASE 45   Cardiac Enzymes No results for input(s): CKTOTAL, CKMB, CKMBINDEX, TROPONINI in the last 72 hours.  BNP: BNP (last 3 results) Recent Labs    02/12/19 2356 05/16/19 1051 01/20/20 1504  BNP 158.1* 1,119.2* 506.0*    ProBNP (last 3 results) Recent Labs    10/11/19 1138 12/08/19 1041 01/11/20 1439  PROBNP 604.0* 710.0* 494.0*     D-Dimer No results for input(s): DDIMER in the last 72 hours. Hemoglobin A1C No results for input(s): HGBA1C in the last 72 hours. Fasting Lipid Panel No results for input(s): CHOL, HDL, LDLCALC, TRIG, CHOLHDL, LDLDIRECT in the last 72 hours. Thyroid Function Tests No results for input(s): TSH, T4TOTAL, T3FREE, THYROIDAB in the last 72 hours.  Invalid input(s): FREET3  Other results:   Imaging    No results found.   Medications:     Scheduled Medications: . allopurinol  200 mg Oral Daily  . Ferrous Fumarate  1 tablet Oral Daily  . folic acid  1 mg Oral Daily  . furosemide  80 mg Intravenous BID  .  macitentan  10 mg Oral Daily  . mometasone-formoterol  2 puff Inhalation BID  . multivitamin with minerals  1 tablet Oral Daily  . pantoprazole  40 mg Oral Daily  . ramelteon  8 mg Oral QHS  . rosuvastatin  5 mg Oral Daily  . Selexipag  1,000 mcg Oral BID  . sodium chloride flush  3 mL Intravenous Q12H  . tadalafil  40 mg Oral Daily  . thiamine  100 mg Oral Daily    Infusions: . sodium chloride    . ferumoxytol      PRN Medications: sodium chloride, acetaminophen, albuterol, hydrOXYzine, ondansetron (ZOFRAN) IV, oxyCODONE, sodium chloride flush, traZODone, zolpidem   Assessment/Plan   1. RV failure: Patient has RV failure due to portopulmonary hypertension.  Echo this admission with LV EF 60-65%, moderate LVH, severely dilated and severely dysfunctional RV, PASP 83 mmHg.  He has cardiorenal syndrome, recently torsemide was cut back with rise in creatinine to > 3.  Creatinine down with IV diuresis and appears to have stabilized ~ 2.5 today. Weight down additional 5 lbs. Remains volume up.  - Continue Lasix 80 mg IV bid.  He needs a couple more days of diuresis.  - If renal function worsens, consider milrinone to support RV (but improved and stable today).   2. Pulmonary hypertension: Thought to have portopulmonary hypertension with RV failure.   - Continue home PH meds (Selexipag, tadalafil, and Opsumit).  3. AKI on CKD stage.  Creatinine 2.5 today, (peaked to 3.3 this admit).  Follow with diuresis, hopefully unloading RV and decreasing renal venous pressure will help with renal function.  4. Cirrhosis: From ETOH.  5. Pancytopenia: This is known from the past.  Thought to be related to cirrhosis/splenic sequestration.  - CBC 3/20 showed hgb 8.8. Fe 54. Sats ratio 16%. Getting feraheme    Length of Stay: 3  [name], PA-C  01/23/2020, 11:37 AM  Advanced Heart Failure Team Pager [phone] (M-F; 7a - 4p)  Please contact [facility] for night-coverage after hours (4p  -7a ) and weekends on [email or web address]  Patient seen and examined with the above-signed Advanced Practice Provider and/or Housestaff. I personally reviewed laboratory data, imaging studies and relevant notes. I independently examined the patient and formulated the important aspects of the plan. I have edited the note to reflect any of my changes or salient points. I have personally discussed  the plan with the patient and/or family.  Mains volume overloaded.  Responding well to IV diuresis.  Serum creatinine is stable.  He is on triple therapy for his pulmonary hypertension.  Unfortunately he has had a progressive course in the setting of ongoing alcohol abuse.  Also has chronic pancytopenia which has been evaluated by hematology and felt to be related to his splenic sequestration.  Really no bleeding.  He is getting Feraheme this morning for iron deficiency.  Continue potassium supplementation for hypokalemia.  Darren Bickers, MD  1:52 PM

## 2020-01-23 NOTE — Progress Notes (Signed)
PROGRESS NOTE  Darren Mueller OZD:[id number] DOB: 1951-09-13   PCP: [name], MD  Patient is from: Home.  Independently ambulates at baseline.  DOA: 01/20/2020 LOS: 3  Brief Narrative / Interim history: 69 year old male with history of RV failure/severe P HTN, EtOH cirrhosis, esophageal varices, minimal CADon R/LHC, CKD-3A, COPD, HTN and pancytopenia presented to ED from PCP office with CHF exacerbation.  Heart shortness of breath, increased edema and about 6 pounds weight gain in 1 week.  In ED, HDS and normal saturation on RA.  BNP 506 (dry BNP 158). Cr 2.8 (b/l 2-3).  Hgb 8.8 (baseline).  WBC 1.8 (about baseline). HS trop 33.  EKG sinus rhythm with diffuse T wave changes.  CXR with cardiomegaly and central vascular congestion.  Received IV Lasix 80 mg once, and admitted for CHF exacerbation.  Cardiology (advanced heart failure team) consulted.  Echocardiogram with EF of 60 to 65%, moderate LVH, D-shaped IVS, severe RV dilation and dysfunction, PASP of 83 and severely dilated IVC.   Subjective: Seen and examined earlier this morning.  Patient had episodic severe frontal headache with nausea, emesis and abdominal pain.  Denies photophobia or phonophobia.  Denies vision change, focal weakness, numbness or tingling.  No history of migraine.  Pain improved with subcu Imitrex last night.  Tylenol or oxycodone did not help.  He also reports some pain over right subcostal area.  Shortness of breath and edema improved.  Objective: Vitals:   01/23/20 0559 01/23/20 0743 01/23/20 0855 01/23/20 1138  BP: 116/76  103/66 121/71  Pulse: 81  76 76  Resp: 16  16 18   Temp: 98 F (36.7 C)  97.9 F (36.6 C) 97.7 F (36.5 C)  TempSrc: Oral  Oral Oral  SpO2: 99% 95% 98% 97%  Weight: 91.9 kg     Height:        Intake/Output Summary (Last 24 hours) at 01/23/2020 1225 Last data filed at 01/23/2020 1215 Gross per 24 hour  Intake 710 ml  Output 1000 ml  Net -290 ml   Filed Weights   01/21/20  0512 01/22/20 0500 01/23/20 0559  Weight: 96.7 kg 93.9 kg 91.9 kg    Examination:  GENERAL: No acute distress.  Appears well.  HEENT: MMM.  Vision and hearing grossly intact.  NECK: Supple.  Prominent JVD RESP: On RA.  No IWOB.  Fair aeration.  Bibasilar crackles. CVS:  RRR. Heart sounds normal.  ABD/GI/GU: Bowel sounds present. Soft.  Mild tenderness over RUQ.  No Murphy sign. MSK/EXT:  Moves extremities. No apparent deformity.  2+ pitting edema bilaterally. SKIN: no apparent skin lesion or wound NEURO: Awake, alert and oriented appropriately.  PERRL.  EOMI.  No apparent focal neuro deficit. PSYCH: Calm. Normal affect.  Procedures:  None  Assessment & Plan: Acute on Chronic diastolic/RV failure/severe PAH-Echo with EF of 60 to 65%, moderate LVH, D-shaped IVS, severe RV dilation and dysfunction, PASP of 83 and severely dilated IVC. RRR/LHC in 2017 with minimal CAD and moderate P HTN. BNP higher than baseline.  CXR consistent with CHF.  Responding to IV Lasix.  About 1.4 L UOP/24 hours charted.  Renal function slightly up today. -Advanced HF team managing-continue IV Lasix 80 mg twice daily -Monitor fluid status, renal function and electrolytes -TED hose for lower extremities -Sodium and fluid restrictions -Continue home PH meds (Selexipag, tadalafil, and Opsumit) per cardiology  Severe episodic headache-suspect migraine in the setting of aggressive diuresis although no history of this.  Neuro exam reassuring. -  Give additional subcu Imitrex this morning-we will switch to p.o. daily later in the day. -May consider migraine cocktail without Toradol if continues to recur. -Encourage nightly CPAP which might help  RUQ pain-suspect this to be from congestive hepatopathy in the setting of severe RV failure. -RUQ ultrasound to exclude gallbladder etiology -May consider liver Doppler  Elevated troponin due to demand ischemia in the setting of CHF exacerbation: Pattern not consistent with  ACS.  No chest pain. -Manage CHF as above.   AKI on CKD IIIb/azotemia: Cr 2.13 on 3/10> 3.33 on 3/15> 2.80> 2.51> 2.55.  BUN in 60> 46.  Suspect cardiorenal syndrome in the setting of CHF.  Improving with diuretics. -Diuretics as above -Continue monitoring  Hypokalemia/hypomagnesemia -Replenish and recheck.  First-degree AV block-not on nodal blocking agents. -Continue monitoring  ETOH Cirrhosis/portal gastropathy/varices:Stable. -Diuretics as above  History of seizure? - Continue Keppra. Follows with Dr. Karel Jarvis  Chronic COPD: Stable. -Continue home breathing treatments   OSA not on CPAP: was not able to tolerate CPAP -Encourage nightly CPAP   Pancytopenia: Chronic.  Likely due to cirrhosis and splenic sequestration -Continue monitoring -Continue Hemocyte and folic acid  Insomnia -Continue home trazodone, ramelteon and as needed Ambien                DVT prophylaxis: SCD in the setting of thrombocytopenia Code Status: Full code Family Communication: Patient and/or RN. Available if any question.  Discharge barrier: Evaluation and treatment of acute CHF requiring IV diuretics and close monitoring on telemetry Patient is from: Home Final disposition: Likely home in the next 3 to 4 days when stable and cleared by cardiology  Consultants: Advanced HF team   Microbiology summarized: COVID-19 negative.  Sch Meds:  Scheduled Meds: . allopurinol  200 mg Oral Daily  . Ferrous Fumarate  1 tablet Oral Daily  . folic acid  1 mg Oral Daily  . furosemide  80 mg Intravenous BID  . macitentan  10 mg Oral Daily  . mometasone-formoterol  2 puff Inhalation BID  . multivitamin with minerals  1 tablet Oral Daily  . pantoprazole  40 mg Oral Daily  . ramelteon  8 mg Oral QHS  . rosuvastatin  5 mg Oral Daily  . Selexipag  1,000 mcg Oral BID  . sodium chloride flush  3 mL Intravenous Q12H  . tadalafil  40 mg Oral Daily  . thiamine  100 mg Oral Daily   Continuous  Infusions: . sodium chloride     PRN Meds:.sodium chloride, acetaminophen, albuterol, hydrOXYzine, ondansetron (ZOFRAN) IV, oxyCODONE, sodium chloride flush, traZODone, zolpidem  Antimicrobials: Anti-infectives (From admission, onward)   None       I have personally reviewed the following labs and images: CBC: Recent Labs  Lab 01/20/20 1503  WBC 1.8*  NEUTROABS 1.0*  HGB 8.8*  HCT 29.0*  MCV 107.4*  PLT 30*   BMP &GFR Recent Labs  Lab 01/16/20 1524 01/20/20 1503 01/21/20 0841 01/22/20 0428 01/22/20 1953 01/23/20 0454  NA 135 135  --  138 139 140  K 3.4* 3.5  --  3.3* 3.8 3.7  CL 103 105  --  103 106 106  CO2 23 22  --  23 23 24   GLUCOSE 113* 112*  --  118* 131* 114*  BUN 65* 59*  --  46* 43* 42*  CREATININE 3.33* 2.80*  --  2.51* 2.47* 2.55*  CALCIUM 8.5 8.4*  --  8.4* 8.6* 8.6*  MG  --   --  1.7 2.0 2.0 1.9  PHOS  --   --   --  2.7  --  3.5   Estimated Creatinine Clearance: 30 mL/min (A) (by C-G formula based on SCr of 2.55 mg/dL (H)). Liver & Pancreas: Recent Labs  Lab 01/16/20 1524 01/20/20 1503 01/22/20 0428 01/22/20 1953 01/23/20 0454  AST 36 45*  --  46*  --   ALT 9 12  --  14  --   ALKPHOS 448* 418*  --  381*  --   BILITOT 2.0* 1.9*  --  2.3*  --   PROT 7.3 7.1  --  6.7  --   ALBUMIN 3.2* 3.0* 2.5* 2.7* 2.6*   Recent Labs  Lab 01/22/20 1953  LIPASE 45   No results for input(s): AMMONIA in the last 168 hours. Diabetic: No results for input(s): HGBA1C in the last 72 hours. No results for input(s): GLUCAP in the last 168 hours. Cardiac Enzymes: No results for input(s): CKTOTAL, CKMB, CKMBINDEX, TROPONINI in the last 168 hours. Recent Labs    10/11/19 1138 12/08/19 1041 01/11/20 1439  PROBNP 604.0* 710.0* 494.0*   Coagulation Profile: No results for input(s): INR, PROTIME in the last 168 hours. Thyroid Function Tests: No results for input(s): TSH, T4TOTAL, FREET4, T3FREE, THYROIDAB in the last 72 hours. Lipid Profile: No results  for input(s): CHOL, HDL, LDLCALC, TRIG, CHOLHDL, LDLDIRECT in the last 72 hours. Anemia Panel: Recent Labs    01/21/20 0841  VITAMINB12 618  FOLATE 46.8  FERRITIN 25  TIBC 332  IRON 54  RETICCTPCT 1.8   Urine analysis:    Component Value Date/Time   COLORURINE YELLOW 11/18/2017 0250   APPEARANCEUR CLEAR 11/18/2017 0250   LABSPEC 1.008 11/18/2017 0250   PHURINE 5.0 11/18/2017 0250   GLUCOSEU NEGATIVE 11/18/2017 0250   HGBUR NEGATIVE 11/18/2017 0250   BILIRUBINUR neg 12/18/2017 1352   KETONESUR NEGATIVE 11/18/2017 0250   PROTEINUR net 12/18/2017 1352   PROTEINUR NEGATIVE 11/18/2017 0250   UROBILINOGEN 0.2 12/18/2017 1352   UROBILINOGEN 0.2 10/18/2013 1247   NITRITE neg 12/18/2017 1352   NITRITE NEGATIVE 11/18/2017 0250   LEUKOCYTESUR Negative 12/18/2017 1352   Sepsis Labs: Invalid input(s): PROCALCITONIN, LACTICIDVEN  Microbiology: Recent Results (from the past 240 hour(s))  SARS CORONAVIRUS 2 (TAT 6-24 HRS) Nasopharyngeal Nasopharyngeal Swab     Status: None   Collection Time: 01/20/20  7:29 PM   Specimen: Nasopharyngeal Swab  Result Value Ref Range Status   SARS Coronavirus 2 NEGATIVE NEGATIVE Final    Comment: (NOTE) SARS-CoV-2 target nucleic acids are NOT DETECTED. The SARS-CoV-2 RNA is generally detectable in upper and lower respiratory specimens during the acute phase of infection. Negative results do not preclude SARS-CoV-2 infection, do not rule out co-infections with other pathogens, and should not be used as the sole basis for treatment or other patient management decisions. Negative results must be combined with clinical observations, patient history, and epidemiological information. The expected result is Negative. Fact Sheet for Patients: HairSlick.no Fact Sheet for Healthcare Providers: quierodirigir.com This test is not yet approved or cleared by the [place] FDA and  has been authorized for  detection and/or diagnosis of SARS-CoV-2 by FDA under an Emergency Use Authorization (EUA). This EUA will remain  in effect (meaning this test can be used) for the duration of the COVID-19 declaration under Section 56 4(b)(1) of the Act, 21 U.S.C. section 360bbb-3(b)(1), unless the authorization is terminated or revoked sooner. Performed at [facility], [address] [place], [place] [address]     Radiology Studies: No results found.   Jonelle Bann T. Holger Sokolowski [facility]  If 7PM-7AM, please contact night-coverage [email or web address] Password [facility] 01/23/2020, 12:25 PM

## 2020-01-24 ENCOUNTER — Inpatient Hospital Stay (HOSPITAL_COMMUNITY): Payer: Medicare Other

## 2020-01-24 LAB — CBC
HCT: 30.6 % — ABNORMAL LOW (ref 39.0–52.0)
Hemoglobin: 9.2 g/dL — ABNORMAL LOW (ref 13.0–17.0)
MCH: 31.9 pg (ref 26.0–34.0)
MCHC: 30.1 g/dL (ref 30.0–36.0)
MCV: 106.3 fL — ABNORMAL HIGH (ref 80.0–100.0)
Platelets: 23 10*3/uL — CL (ref 150–400)
RBC: 2.88 MIL/uL — ABNORMAL LOW (ref 4.22–5.81)
RDW: 17.9 % — ABNORMAL HIGH (ref 11.5–15.5)
WBC: 1.6 10*3/uL — ABNORMAL LOW (ref 4.0–10.5)
nRBC: 0 % (ref 0.0–0.2)

## 2020-01-24 LAB — RENAL FUNCTION PANEL
Albumin: 2.8 g/dL — ABNORMAL LOW (ref 3.5–5.0)
Anion gap: 9 (ref 5–15)
BUN: 43 mg/dL — ABNORMAL HIGH (ref 8–23)
CO2: 24 mmol/L (ref 22–32)
Calcium: 8.9 mg/dL (ref 8.9–10.3)
Chloride: 105 mmol/L (ref 98–111)
Creatinine, Ser: 3.12 mg/dL — ABNORMAL HIGH (ref 0.61–1.24)
GFR calc Af Amer: 22 mL/min — ABNORMAL LOW (ref >60)
GFR calc non Af Amer: 19 mL/min — ABNORMAL LOW (ref >60)
Glucose, Bld: 146 mg/dL — ABNORMAL HIGH (ref 70–99)
Phosphorus: 4.3 mg/dL (ref 2.5–4.6)
Potassium: 4.3 mmol/L (ref 3.5–5.1)
Sodium: 138 mmol/L (ref 135–145)

## 2020-01-24 LAB — MAGNESIUM: Magnesium: 2.3 mg/dL (ref 1.7–2.4)

## 2020-01-24 MED ORDER — SODIUM CHLORIDE 0.9 % IV SOLN: Freq: Once | INTRAVENOUS | Status: AC

## 2020-01-24 MED ORDER — DEXAMETHASONE SODIUM PHOSPHATE 10 MG/ML IJ SOLN
10.0000 mg | Freq: Once | INTRAMUSCULAR | Status: AC
  Administered 2020-01-24: 19:00:00 10 mg via INTRAVENOUS
  Filled 2020-01-24: qty 1

## 2020-01-24 MED ORDER — SUMATRIPTAN SUCCINATE 6 MG/0.5ML ~~LOC~~ SOLN
6.0000 mg | SUBCUTANEOUS | Status: AC
  Administered 2020-01-24: 10:00:00 6 mg via SUBCUTANEOUS
  Filled 2020-01-24: qty 0.5

## 2020-01-24 MED ORDER — BUTALBITAL-APAP-CAFFEINE 50-325-40 MG PO TABS
1.0000 | ORAL_TABLET | Freq: Four times a day (QID) | ORAL | Status: DC | PRN
  Administered 2020-01-24 – 2020-01-26 (×3): 1 via ORAL
  Filled 2020-01-24 (×3): qty 1

## 2020-01-24 MED ORDER — DIPHENHYDRAMINE HCL 50 MG/ML IJ SOLN
12.5000 mg | Freq: Once | INTRAMUSCULAR | Status: AC
  Administered 2020-01-24: 19:00:00 12.5 mg via INTRAVENOUS
  Filled 2020-01-24: qty 1

## 2020-01-24 MED ORDER — PROCHLORPERAZINE EDISYLATE 10 MG/2ML IJ SOLN
10.0000 mg | INTRAMUSCULAR | Status: DC | PRN
  Administered 2020-01-24 – 2020-01-25 (×4): 10 mg via INTRAVENOUS
  Filled 2020-01-24 (×5): qty 2

## 2020-01-24 NOTE — Progress Notes (Signed)
CRITICAL LAB VALUE  Platelet Count: 23 On call NP, Sharlet Salina, Notified.  Ermalinda Memos, RN

## 2020-01-24 NOTE — Progress Notes (Signed)
PROGRESS NOTE  Darren Mueller UEA:[id number] DOB: 04/05/1951   PCP: [name], MD  Patient is from: Home.  Independently ambulates at baseline.  DOA: 01/20/2020 LOS: 4  Brief Narrative / Interim history: 69 year old male with history of RV failure/severe P HTN, EtOH cirrhosis, esophageal varices, minimal CADon R/LHC, CKD-3A, COPD, HTN and pancytopenia presented to ED from PCP office with CHF exacerbation.  Heart shortness of breath, increased edema and about 6 pounds weight gain in 1 week.  In ED, HDS and normal saturation on RA.  BNP 506 (dry BNP 158). Cr 2.8 (b/l 2-3).  Hgb 8.8 (baseline).  WBC 1.8 (about baseline). HS trop 33.  EKG sinus rhythm with diffuse T wave changes.  CXR with cardiomegaly and central vascular congestion.  Received IV Lasix 80 mg once, and admitted for CHF exacerbation.  Cardiology (advanced heart failure team) consulted.  Echocardiogram with EF of 60 to 65%, moderate LVH, D-shaped IVS, severe RV dilation and dysfunction, PASP of 83 and severely dilated IVC.   Subjective: Seen and examined earlier this morning.  Patient had 2 episodes of emesis with headache.  No nausea and emesis resolved but is still with headache.  Headache is over frontal areas.  Somewhat throbbing.  Rates his pain 9/10.  He denies chest pain, dyspnea or UTI symptoms.  Had about 1.4 L UOP.  Renal function worse today.  Objective: Vitals:   01/23/20 1950 01/24/20 0512 01/24/20 0730 01/24/20 1151  BP: (!) 158/93 132/77  117/81  Pulse: 91 72  88  Resp: 17 19  18   Temp: 98.2 F (36.8 C) 97.7 F (36.5 C)  97.9 F (36.6 C)  TempSrc: Oral Oral  Oral  SpO2: 100% 98% 95% 97%  Weight:  90.4 kg    Height:        Intake/Output Summary (Last 24 hours) at 01/24/2020 1348 Last data filed at 01/24/2020 1300 Gross per 24 hour  Intake 680 ml  Output 1450 ml  Net -770 ml   Filed Weights   01/22/20 0500 01/23/20 0559 01/24/20 0512  Weight: 93.9 kg 91.9 kg 90.4 kg     Examination:  GENERAL: No acute distress.  Appears well.  HEENT: MMM.  PERRL.  EOMI.  No photophobia.  No tenderness over temporal areas. NECK: Supple.  Prominent JVD. RESP: On RA.  No IWOB. Good air movement bilaterally. CVS:  RRR. Heart sounds normal.  ABD/GI/GU: Bowel sounds present. Soft. Non tender.  MSK/EXT:  Moves extremities. No apparent deformity.  2+ edema bilaterally. SKIN: no apparent skin lesion or wound NEURO: Awake, alert and oriented appropriately.  No apparent focal neuro deficit. PSYCH: Calm. Normal affect.  Procedures:  None  Assessment & Plan: Acute on Chronic diastolic/RV failure/severe PAH-Echo with EF of 60 to 65%, moderate LVH, D-shaped IVS, severe RV dilation and dysfunction, PASP of 83 and severely dilated IVC. RRR/LHC in 2017 with minimal CAD and moderate P HTN. BNP higher than baseline.  Responded to IV Lasix.  About 1.4 L UOP/24 hours charted.  However, creatinine up from 2.5-3.12. -Advanced HF team managing-Lasix on hold.  Plan for RHC in the morning if renal function improves. -Monitor fluid status, renal function and electrolytes -TED hose for lower extremities -Sodium and fluid restrictions  -Continue home PH meds (Selexipag, tadalafil, and Opsumit) per cardiology  Severe episodic headache-suspect migraine in the setting of aggressive diuresis although no history of this.  Neuro exam reassuring.  CT head without contrast without acute finding. -Continue as needed Imitrex.  Also on Compazine for nausea which could help with headache. -Not able to use CPAP due to claustrophobia.  RUQ pain-suspect this to be from congestive hepatopathy in the setting of severe RV failure.  RUQ [place] with known cirrhosis and new small ascites. -Liver Doppler?  Elevated troponin due to demand ischemia in the setting of CHF exacerbation: Pattern not consistent with ACS.  No chest pain. -Manage CHF as above.   AKI on CKD IIIb/azotemia: Cr 2.13 on 3/10> 3.33 on 3/15>  2.80> 2.51> 3.13.  BUN in 60> 46> 43. -500 cc bolus per cardiology. -Diuretics on hold -Continue monitoring  Hypokalemia/hypomagnesemia -Monitor and replenish  First-degree AV block-not on nodal blocking agents. -Continue monitoring  ETOH Cirrhosis/portal gastropathy/varices:Stable. -Diuretics as above  History of seizure? - Continue Keppra. Follows with Dr. Karel Jarvis  Chronic COPD: Stable. -Continue home breathing treatments   OSA not on CPAP: was not able to tolerate CPAP due to claustrophobia   Pancytopenia: Chronic.  Likely due to EtOH, cirrhosis and splenic sequestration -Gave IV Feraheme x1 on 3/22 -Continue monitoring-no signs of bleeding -Continue Hemocyte and folic acid  Insomnia -Continue home trazodone, ramelteon and as needed Ambien                DVT prophylaxis: SCD in the setting of thrombocytopenia Code Status: Full code Family Communication: Updated patient's sister at bedside.  Discharge barrier: Evaluation and treatment of RV failure, AKI Patient is from: Home Final disposition: Likely home in the next 3 to 4 days when stable and cleared by cardiology  Consultants: Advanced HF team   Microbiology summarized: COVID-19 negative.  Sch Meds:  Scheduled Meds: . allopurinol  200 mg Oral Daily  . Ferrous Fumarate  1 tablet Oral Daily  . folic acid  1 mg Oral Daily  . macitentan  10 mg Oral Daily  . mometasone-formoterol  2 puff Inhalation BID  . multivitamin with minerals  1 tablet Oral Daily  . pantoprazole  40 mg Oral Daily  . potassium chloride  40 mEq Oral BID  . rosuvastatin  5 mg Oral Daily  . Selexipag  1,000 mcg Oral BID  . sodium chloride flush  3 mL Intravenous Q12H  . tadalafil  40 mg Oral Daily  . thiamine  100 mg Oral Daily   Continuous Infusions: . sodium chloride    . sodium chloride 100 mL/hr at 01/24/20 1206   PRN Meds:.sodium chloride, acetaminophen, albuterol, ondansetron (ZOFRAN) IV, oxyCODONE,  prochlorperazine, sodium chloride flush  Antimicrobials: Anti-infectives (From admission, onward)   None       I have personally reviewed the following labs and images: CBC: Recent Labs  Lab 01/20/20 1503 01/24/20 0420  WBC 1.8* 1.6*  NEUTROABS 1.0*  --   HGB 8.8* 9.2*  HCT 29.0* 30.6*  MCV 107.4* 106.3*  PLT 30* 23*   BMP &GFR Recent Labs  Lab 01/20/20 1503 01/21/20 0841 01/22/20 0428 01/22/20 1953 01/23/20 0454 01/24/20 0420  NA 135  --  138 139 140 138  K 3.5  --  3.3* 3.8 3.7 4.3  CL 105  --  103 106 106 105  CO2 22  --  23 23 24 24   GLUCOSE 112*  --  118* 131* 114* 146*  BUN 59*  --  46* 43* 42* 43*  CREATININE 2.80*  --  2.51* 2.47* 2.55* 3.12*  CALCIUM 8.4*  --  8.4* 8.6* 8.6* 8.9  MG  --  1.7 2.0 2.0 1.9 2.3  PHOS  --   --  2.7  --  3.5 4.3   Estimated Creatinine Clearance: 24.5 mL/min (A) (by C-G formula based on SCr of 3.12 mg/dL (H)). Liver & Pancreas: Recent Labs  Lab 01/20/20 1503 01/22/20 0428 01/22/20 1953 01/23/20 0454 01/24/20 0420  AST 45*  --  46*  --   --   ALT 12  --  14  --   --   ALKPHOS 418*  --  381*  --   --   BILITOT 1.9*  --  2.3*  --   --   PROT 7.1  --  6.7  --   --   ALBUMIN 3.0* 2.5* 2.7* 2.6* 2.8*   Recent Labs  Lab 01/22/20 1953  LIPASE 45   No results for input(s): AMMONIA in the last 168 hours. Diabetic: No results for input(s): HGBA1C in the last 72 hours. No results for input(s): GLUCAP in the last 168 hours. Cardiac Enzymes: No results for input(s): CKTOTAL, CKMB, CKMBINDEX, TROPONINI in the last 168 hours. Recent Labs    10/11/19 1138 12/08/19 1041 01/11/20 1439  PROBNP 604.0* 710.0* 494.0*   Coagulation Profile: No results for input(s): INR, PROTIME in the last 168 hours. Thyroid Function Tests: No results for input(s): TSH, T4TOTAL, FREET4, T3FREE, THYROIDAB in the last 72 hours. Lipid Profile: No results for input(s): CHOL, HDL, LDLCALC, TRIG, CHOLHDL, LDLDIRECT in the last 72 hours. Anemia  Panel: No results for input(s): VITAMINB12, FOLATE, FERRITIN, TIBC, IRON, RETICCTPCT in the last 72 hours. Urine analysis:    Component Value Date/Time   COLORURINE YELLOW 11/18/2017 0250   APPEARANCEUR CLEAR 11/18/2017 0250   LABSPEC 1.008 11/18/2017 0250   PHURINE 5.0 11/18/2017 0250   GLUCOSEU NEGATIVE 11/18/2017 0250   HGBUR NEGATIVE 11/18/2017 0250   BILIRUBINUR neg 12/18/2017 1352   KETONESUR NEGATIVE 11/18/2017 0250   PROTEINUR net 12/18/2017 1352   PROTEINUR NEGATIVE 11/18/2017 0250   UROBILINOGEN 0.2 12/18/2017 1352   UROBILINOGEN 0.2 10/18/2013 1247   NITRITE neg 12/18/2017 1352   NITRITE NEGATIVE 11/18/2017 0250   LEUKOCYTESUR Negative 12/18/2017 1352   Sepsis Labs: Invalid input(s): PROCALCITONIN, LACTICIDVEN  Microbiology: Recent Results (from the past 240 hour(s))  SARS CORONAVIRUS 2 (TAT 6-24 HRS) Nasopharyngeal Nasopharyngeal Swab     Status: None   Collection Time: 01/20/20  7:29 PM   Specimen: Nasopharyngeal Swab  Result Value Ref Range Status   SARS Coronavirus 2 NEGATIVE NEGATIVE Final    Comment: (NOTE) SARS-CoV-2 target nucleic acids are NOT DETECTED. The SARS-CoV-2 RNA is generally detectable in upper and lower respiratory specimens during the acute phase of infection. Negative results do not preclude SARS-CoV-2 infection, do not rule out co-infections with other pathogens, and should not be used as the sole basis for treatment or other patient management decisions. Negative results must be combined with clinical observations, patient history, and epidemiological information. The expected result is Negative. Fact Sheet for Patients: HairSlick.no Fact Sheet for Healthcare Providers: quierodirigir.com This test is not yet approved or cleared by the [place] FDA and  has been authorized for detection and/or diagnosis of SARS-CoV-2 by FDA under an Emergency Use Authorization (EUA). This EUA  will remain  in effect (meaning this test can be used) for the duration of the COVID-19 declaration under Section 56 4(b)(1) of the Act, 21 U.S.C. section 360bbb-3(b)(1), unless the authorization is terminated or revoked sooner. Performed at [facility], [address] [place], [place] [address]     Radiology Studies: CT HEAD WO CONTRAST  Result  Date: 01/24/2020 CLINICAL DATA:  Headache, primarily frontal in location EXAM: CT HEAD WITHOUT CONTRAST TECHNIQUE: Contiguous axial images were obtained from the base of the skull through the vertex without intravenous contrast. COMPARISON:  December 05, 2019 FINDINGS: Brain: Age related volume loss is stable. There is no intracranial mass, hemorrhage, extra-axial fluid collection, or midline shift. There is slight small vessel disease in the centra semiovale bilaterally. Elsewhere brain parenchyma appears unremarkable. There is no demonstrable acute infarct. Vascular: There is no hyperdense vessel. There is calcification in each carotid siphon region. Skull: Bony calvarium appears intact. Sinuses/Orbits: There is mucosal thickening in multiple ethmoid air cells. Other paranasal sinuses are clear. Orbits appear symmetric bilaterally. Apparent prior cataract removals bilaterally. Other: Mastoid air cells are clear. IMPRESSION: Age related volume loss with mild periventricular small vessel disease. No acute infarct. No mass or hemorrhage. There are foci of arterial vascular calcification. There is mucosal thickening in multiple ethmoid air cells. Electronically Signed   By: Bretta Bang III M.D.   On: 01/24/2020 13:23   US ABDOMEN LIMITED RUQ  Result Date: 01/23/2020 CLINICAL DATA:  Right upper quadrant pain for the past week. EXAM: ULTRASOUND ABDOMEN LIMITED RIGHT UPPER QUADRANT COMPARISON:  Right upper quadrant ultrasound dated February 14, 2019. FINDINGS: Gallbladder: Contracted gallbladder with diffuse wall thickening measuring up to 5 mm. No  gallstones visualized. No sonographic Murphy sign noted by sonographer. Common bile duct: Diameter: 7 mm, at the upper limits of normal. Liver: No focal lesion identified. Unchanged nodular contour with coarsened heterogeneous parenchymal echogenicity. Portal vein is patent on color Doppler imaging with normal direction of blood flow towards the liver. Other: Small perihepatic ascites. IMPRESSION: 1. No acute abnormality. 2. Unchanged cirrhosis. New small ascites. 3. Contracted gallbladder with mild diffuse wall thickening likely related to underlying liver disease and ascites. Electronically Signed   By: Obie Dredge M.D.   On: 01/23/2020 15:43     Jaydan Chretien T. Elga Santy [facility]  If 7PM-7AM, please contact night-coverage [email or web address] Password [email or web address] 01/24/2020, 1:48 PM

## 2020-01-24 NOTE — Plan of Care (Signed)
  Problem: Health Behavior/Discharge Planning: Goal: Ability to manage health-related needs will improve Outcome: Progressing   Problem: Education: Goal: Ability to demonstrate management of disease process will improve Outcome: Progressing Goal: Ability to verbalize understanding of medication therapies will improve Outcome: Progressing Goal: Individualized Educational Video(s) Outcome: Progressing   Problem: Activity: Goal: Capacity to carry out activities will improve Outcome: Progressing   Problem: Cardiac: Goal: Ability to achieve and maintain adequate cardiopulmonary perfusion will improve Outcome: Progressing

## 2020-01-24 NOTE — Progress Notes (Addendum)
Patient ID: Darren Mueller, male   DOB: Apr 07, 1951, 69 y.o.   MRN: [id number]     Advanced Heart Failure Rounding Note  PCP-Cardiologist: Glori Bickers, MD   Subjective:     -1.4L in UOP yesterday. Down another 3 lb.   SCr has bumped from 2.55>>3.12. SBPs has been stable. No hypotension. Diuretics now on hold.   Complains of n/v that began around 9am today and diffuse lower abdominal/pelvic pain. Just received zofran. RUQ [place] 3/22 showed new unchanged cirrhosis and new small ascites. AST 46, ALT 14, Total bili 2.3 on 3/21.   No dyspnea. Complains of HA. Platelet ct 23K.   Echo 01/21/20: EF 60-65%, moderate LVH.  D-shaped interventricular septum, RV severely dilated/severely dysfunctional.  IVC dilated. PASP 83 mmHg.    Objective:   Weight Range: 90.4 kg Body mass index is 27.03 kg/m.   Vital Signs:   Temp:  [97.7 F (36.5 C)-98.2 F (36.8 C)] 97.7 F (36.5 C) (03/23 0512) Pulse Rate:  [72-91] 72 (03/23 0512) Resp:  [17-19] 19 (03/23 0512) BP: (111-158)/(71-93) 132/77 (03/23 0512) SpO2:  [95 %-100 %] 95 % (03/23 0730) Weight:  [90.4 kg] 90.4 kg (03/23 0512) Last BM Date: 01/23/20  Weight change: Filed Weights   01/22/20 0500 01/23/20 0559 01/24/20 0512  Weight: 93.9 kg 91.9 kg 90.4 kg    Intake/Output:   Intake/Output Summary (Last 24 hours) at 01/24/2020 1048 Last data filed at 01/24/2020 1388 Gross per 24 hour  Intake 560 ml  Output 1650 ml  Net -1090 ml      Physical Exam    PHYSICAL EXAM: General:  Fatigued appearing, elderly AAM. No respiratory difficulty HEENT: normal anicteric  Neck: supple. elevated JVD to jaw line with prominent CV waves . Carotids 2+ bilat; no bruits. No lymphadenopathy or thyromegaly appreciated. Cor: PMI nondisplaced. Regular rate & rhythm. 2/6 HSM at apex. 2/6 TR  Loud P2.  Lungs: CTAB Abdomen: soft, mild lower abdominal tenderness, nondistended. No hepatosplenomegaly. No bruits or masses. Good bowel sounds.  Extremities: no  cyanosis, clubbing, rash, no LEE Neuro: alert & oriented x 3, cranial nerves grossly intact. moves all 4 extremities w/o difficulty. Affect pleasant  Telemetry   NSR 70-80s, Personally reviewed  Labs    CBC Recent Labs    01/24/20 0420  WBC 1.6*  HGB 9.2*  HCT 30.6*  MCV 106.3*  PLT 23*   Basic Metabolic Panel Recent Labs    01/23/20 0454 01/24/20 0420  NA 140 138  K 3.7 4.3  CL 106 105  CO2 24 24  GLUCOSE 114* 146*  BUN 42* 43*  CREATININE 2.55* 3.12*  CALCIUM 8.6* 8.9  MG 1.9 2.3  PHOS 3.5 4.3   Liver Function Tests Recent Labs    01/22/20 1953 01/22/20 1953 01/23/20 0454 01/24/20 0420  AST 46*  --   --   --   ALT 14  --   --   --   ALKPHOS 381*  --   --   --   BILITOT 2.3*  --   --   --   PROT 6.7  --   --   --   ALBUMIN 2.7*   < > 2.6* 2.8*   < > = values in this interval not displayed.   Recent Labs    01/22/20 1953  LIPASE 45   Cardiac Enzymes No results for input(s): CKTOTAL, CKMB, CKMBINDEX, TROPONINI in the last 72 hours.  BNP: BNP (last 3 results) Recent Labs  02/12/19 2356 05/16/19 1051 01/20/20 1504  BNP 158.1* 1,119.2* 506.0*    ProBNP (last 3 results) Recent Labs    10/11/19 1138 12/08/19 1041 01/11/20 1439  PROBNP 604.0* 710.0* 494.0*     D-Dimer No results for input(s): DDIMER in the last 72 hours. Hemoglobin A1C No results for input(s): HGBA1C in the last 72 hours. Fasting Lipid Panel No results for input(s): CHOL, HDL, LDLCALC, TRIG, CHOLHDL, LDLDIRECT in the last 72 hours. Thyroid Function Tests No results for input(s): TSH, T4TOTAL, T3FREE, THYROIDAB in the last 72 hours.  Invalid input(s): FREET3  Other results:   Imaging    US ABDOMEN LIMITED RUQ  Result Date: 01/23/2020 CLINICAL DATA:  Right upper quadrant pain for the past week. EXAM: ULTRASOUND ABDOMEN LIMITED RIGHT UPPER QUADRANT COMPARISON:  Right upper quadrant ultrasound dated February 14, 2019. FINDINGS: Gallbladder: Contracted gallbladder  with diffuse wall thickening measuring up to 5 mm. No gallstones visualized. No sonographic Murphy sign noted by sonographer. Common bile duct: Diameter: 7 mm, at the upper limits of normal. Liver: No focal lesion identified. Unchanged nodular contour with coarsened heterogeneous parenchymal echogenicity. Portal vein is patent on color Doppler imaging with normal direction of blood flow towards the liver. Other: Small perihepatic ascites. IMPRESSION: 1. No acute abnormality. 2. Unchanged cirrhosis. New small ascites. 3. Contracted gallbladder with mild diffuse wall thickening likely related to underlying liver disease and ascites. Electronically Signed   By: Titus Dubin M.D.   On: 01/23/2020 15:43     Medications:     Scheduled Medications: . allopurinol  200 mg Oral Daily  . Ferrous Fumarate  1 tablet Oral Daily  . folic acid  1 mg Oral Daily  . macitentan  10 mg Oral Daily  . mometasone-formoterol  2 puff Inhalation BID  . multivitamin with minerals  1 tablet Oral Daily  . pantoprazole  40 mg Oral Daily  . potassium chloride  40 mEq Oral BID  . rosuvastatin  5 mg Oral Daily  . Selexipag  1,000 mcg Oral BID  . sodium chloride flush  3 mL Intravenous Q12H  . tadalafil  40 mg Oral Daily  . thiamine  100 mg Oral Daily    Infusions: . sodium chloride      PRN Medications: sodium chloride, acetaminophen, albuterol, ondansetron (ZOFRAN) IV, oxyCODONE, prochlorperazine, sodium chloride flush   Assessment/Plan   1. RV failure: Patient has RV failure due to portopulmonary hypertension.  Echo this admission with LV EF 60-65%, moderate LVH, severely dilated and severely dysfunctional RV, PASP 83 mmHg.  He has cardiorenal syndrome, recently torsemide was cut back with rise in creatinine to > 3.  Creatinine initially improved this admit w/ IV diuresis but significant bump past 24 hrs from 2.5>>3.12. Also w/ n/v and abdominal pain. RUQ [place] 3/22 showed new unchanged cirrhosis and new small  ascites.  - Hold IV diuretics for now. Will give 500 cc bolus of IVFs.  - If SCr not improved in am, will plan RHC to further evaluate  2. Pulmonary hypertension: Thought to have portopulmonary hypertension with RV failure.   - Continue home PH meds (Selexipag, tadalafil, and Opsumit).  3. AKI on CKD stage.  SCr peaked to 3.3 this admit. Improved w/ initially w/ IV Lasix but bump from 2.5>>3.12. May have over diuresed.  - Hold IV lasix for now. Give 500 cc bolus of IVFs - RHC tomorrow if SCr not improved  4. Cirrhosis: From ETOH.  5. Pancytopenia: This is known from the  past.  Thought to be related to cirrhosis/splenic sequestration.  - CBC 3/20 showed hgb 8.8. Fe 54. Sats ratio 16%. Recieved feraheme on 3/22. 6. Headache: - Plt count 23 k. Will check STAT head CT w/o contrast   Length of Stay: [address] PA-C  01/24/2020, 10:48 AM  Advanced Heart Failure Team Pager [phone] (M-F; 7a - 4p)  Please contact [facility] for night-coverage after hours (4p -7a ) and weekends on [email or web address]  Patient seen and examined with the above-signed Advanced Practice Provider and/or Housestaff. I personally reviewed laboratory data, imaging studies and relevant notes. I independently examined the patient and formulated the important aspects of the plan. I have edited the note to reflect any of my changes or salient points. I have personally discussed the plan with the patient and/or family.  Remains very tenuous. Feels worse today. Continues with severe HAs. No other focal neuro sx. Diuresed well yesterday. Feels weak. Creatinine up to 3.1. Poor appetite. PLT down to 23k  General:  Weak appearing. No resp difficulty HEENT: normal Neck: supple. JVP to jaw with prominent cv wavesCarotids 2+ bilat; no bruits. No lymphadenopathy or thryomegaly appreciated. Cor: PMI nondisplaced. Regular rate & rhythm. 2/6 TR loud P2 Lungs: clear Abdomen: soft, nontender, nondistended. No hepatosplenomegaly. No  bruits or masses. Good bowel sounds. Extremities: no cyanosis, clubbing, rash, no edema Neuro: alert & orientedx3, cranial nerves grossly intact. moves all 4 extremities w/o difficulty. Affect pleasant  Very tenuous with advanced portopulmonary HTN. Volume status improved but creatinine now up. Likely overdiuresed. Hold lasix and give back 500cc NS. If not improving can plan RHC tomorrow. With worsening HAs and low PLTs needs head CT today to exclude ICH.   Options increasingly limited.   Glori Bickers, MD  11:39 AM

## 2020-01-25 DIAGNOSIS — K766 Portal hypertension: Secondary | ICD-10-CM

## 2020-01-25 LAB — CBC
HCT: 28.2 % — ABNORMAL LOW (ref 39.0–52.0)
Hemoglobin: 8.7 g/dL — ABNORMAL LOW (ref 13.0–17.0)
MCH: 32.5 pg (ref 26.0–34.0)
MCHC: 30.9 g/dL (ref 30.0–36.0)
MCV: 105.2 fL — ABNORMAL HIGH (ref 80.0–100.0)
Platelets: 26 10*3/uL — CL (ref 150–400)
RBC: 2.68 MIL/uL — ABNORMAL LOW (ref 4.22–5.81)
RDW: 17.8 % — ABNORMAL HIGH (ref 11.5–15.5)
WBC: 2.5 10*3/uL — ABNORMAL LOW (ref 4.0–10.5)
nRBC: 0 % (ref 0.0–0.2)

## 2020-01-25 LAB — RENAL FUNCTION PANEL
Albumin: 2.7 g/dL — ABNORMAL LOW (ref 3.5–5.0)
Anion gap: 8 (ref 5–15)
BUN: 50 mg/dL — ABNORMAL HIGH (ref 8–23)
CO2: 22 mmol/L (ref 22–32)
Calcium: 8.6 mg/dL — ABNORMAL LOW (ref 8.9–10.3)
Chloride: 107 mmol/L (ref 98–111)
Creatinine, Ser: 2.9 mg/dL — ABNORMAL HIGH (ref 0.61–1.24)
GFR calc Af Amer: 24 mL/min — ABNORMAL LOW (ref >60)
GFR calc non Af Amer: 21 mL/min — ABNORMAL LOW (ref >60)
Glucose, Bld: 137 mg/dL — ABNORMAL HIGH (ref 70–99)
Phosphorus: 4.4 mg/dL (ref 2.5–4.6)
Potassium: 5.1 mmol/L (ref 3.5–5.1)
Sodium: 137 mmol/L (ref 135–145)

## 2020-01-25 LAB — MAGNESIUM: Magnesium: 2.2 mg/dL (ref 1.7–2.4)

## 2020-01-25 MED ORDER — SUMATRIPTAN SUCCINATE 50 MG PO TABS
50.0000 mg | ORAL_TABLET | ORAL | Status: AC | PRN
  Administered 2020-01-25 (×2): 50 mg via ORAL
  Filled 2020-01-25 (×3): qty 1

## 2020-01-25 NOTE — Progress Notes (Signed)
Physical Therapy Treatment Patient Details Name: Darren Mueller MRN: [id number] DOB: [birth year] Today's Date: 01/25/2020    History of Present Illness 69 y.o. male with PMH significant for portopulmonary hypertension with RV failure, ETOH cirrhosis,Grade 1, small esophageal varices, CAD: R/L cath 6/17 noting minimal CAD with moderate PAH and preserved cardiac output, CKDIIIa, COPD,  HTN, HLD, vertigo, seizures, gout. herat murmur and pancytopenia who presents to the ED in referral from pcp due concern for CHF exacerbation with  history of worsening shortness of breath, and increase lower extremity swelling. Patient notes associated weight gain of around 6lb over the last one week and dry cough.     PT Comments    Patient is making progress toward PT goals and tolerated increased mobility well. PT will continue to follow acutely.    Follow Up Recommendations  No PT follow up     Equipment Recommendations  None recommended by PT    Recommendations for Other Services       Precautions / Restrictions Precautions Precautions: None    Mobility  Bed Mobility Overal bed mobility: Independent                Transfers Overall transfer level: Modified independent Equipment used: None                Ambulation/Gait Ambulation/Gait assistance: Modified independent (Device/Increase time) Gait Distance (Feet): 150 Feet Assistive device: None(holding onto rail in hallway at times) Gait Pattern/deviations: Step-through pattern;Decreased stride length Gait velocity: decreased   General Gait Details: slow cadence, grossly steady; pt using single UE support at times but declined trial of SPC; no LOB   Stairs             Wheelchair Mobility    Modified Rankin (Stroke Patients Only)       Balance                                            Cognition Arousal/Alertness: Awake/alert Behavior During Therapy: WFL for tasks  assessed/performed Overall Cognitive Status: Within Functional Limits for tasks assessed                                        Exercises      General Comments General comments (skin integrity, edema, etc.): pt c/o nausea      Pertinent Vitals/Pain Pain Assessment: No/denies pain    Home Living                      Prior Function            PT Goals (current goals can now be found in the care plan section) Acute Rehab PT Goals Patient Stated Goal: get home Progress towards PT goals: Progressing toward goals    Frequency    Min 3X/week      PT Plan Current plan remains appropriate    Co-evaluation              AM-PAC PT "6 Clicks" Mobility   Outcome Measure  Help needed turning from your back to your side while in a flat bed without using bedrails?: None Help needed moving from lying on your back to sitting on the side of a flat bed without using bedrails?: None Help needed moving  to and from a bed to a chair (including a wheelchair)?: None Help needed standing up from a chair using your arms (e.g., wheelchair or bedside chair)?: None Help needed to walk in hospital room?: None Help needed climbing 3-5 steps with a railing? : A Little 6 Click Score: 23    End of Session   Activity Tolerance: Patient tolerated treatment well Patient left: in bed;with call bell/phone within reach Nurse Communication: Mobility status PT Visit Diagnosis: Unsteadiness on feet (R26.81);Other abnormalities of gait and mobility (R26.89)     Time: 8004-4715 PT Time Calculation (min) (ACUTE ONLY): 12 min  Charges:  $Gait Training: 8-22 mins                     Earney Navy, PTA Acute Rehabilitation Services Pager: [phone] Office: [phone]     Darliss Cheney 01/25/2020, 6:34 PM

## 2020-01-25 NOTE — Progress Notes (Signed)
Pt states he does not wear CPAP and is declining use. Pt respiratory status is stable at this time. RT will dc order after pts multiple refusals/declines to wear. RT will continue to monitor.

## 2020-01-25 NOTE — Plan of Care (Signed)
  Problem: Health Behavior/Discharge Planning: Goal: Ability to manage health-related needs will improve Outcome: Progressing   Problem: Education: Goal: Ability to demonstrate management of disease process will improve Outcome: Progressing Goal: Ability to verbalize understanding of medication therapies will improve Outcome: Progressing Goal: Individualized Educational Video(s) Outcome: Progressing   Problem: Activity: Goal: Capacity to carry out activities will improve Outcome: Progressing   Problem: Cardiac: Goal: Ability to achieve and maintain adequate cardiopulmonary perfusion will improve Outcome: Progressing

## 2020-01-25 NOTE — Progress Notes (Addendum)
Patient ID: Darren Mueller, male   DOB: [birth year], 69 y.o.   MRN: [id number]     Advanced Heart Failure Rounding Note  PCP-Cardiologist: Glori Bickers, MD   Subjective:    Diuretics held yesterday and given 500 cc bolus of IVFs yesterday to correct overdiuresis.   SCr improved today, down from 3.12>>2.90.   Volume status ok. No wt change in past 24 hr. Wt remains stable at 199 lb.   Continues w/ nausea and vomiting. Getting zofran.   CT of head done yesterday due to headache in setting of low severe thrombocytopenia. Negative study. No hemorrhage.   Still w/ mild HA.   No dyspnea.   Objective:   Weight Range: 90.7 kg Body mass index is 27.11 kg/m.   Vital Signs:   Temp:  [97.3 F (36.3 C)-98.1 F (36.7 C)] 97.5 F (36.4 C) (03/24 0415) Pulse Rate:  [72-88] 72 (03/24 0415) Resp:  [18-20] 20 (03/24 0415) BP: (117-134)/(70-81) 134/77 (03/24 0415) SpO2:  [93 %-100 %] 93 % (03/24 0719) FiO2 (%):  [21 %] 21 % (03/23 2004) Weight:  [90.7 kg] 90.7 kg (03/24 0415) Last BM Date: 01/24/20  Weight change: Filed Weights   01/23/20 0559 01/24/20 0512 01/25/20 0415  Weight: 91.9 kg 90.4 kg 90.7 kg    Intake/Output:   Intake/Output Summary (Last 24 hours) at 01/25/2020 1140 Last data filed at 01/25/2020 0940 Gross per 24 hour  Intake 1160 ml  Output 400 ml  Net 760 ml      Physical Exam    PHYSICAL EXAM: General:  Fatigued appearing. No respiratory difficulty HEENT: normal anicteric  Neck: supple. elevated JVD to jaw line with prominent CV waves . Carotids 2+ bilat; no bruits. No lymphadenopathy or thyromegaly appreciated. Cor: PMI nondisplaced. Regular rate & rhythm. 2/6 HSM at apex. 2/6 TR  Loud P2.  Lungs: CTAB, no wheezing  Abdomen: soft, mild lower abdominal tenderness, nondistended. No hepatosplenomegaly. No bruits or masses. Good bowel sounds.  Extremities: no cyanosis, clubbing, rash, no LEE Neuro: alert & oriented x 3, cranial nerves grossly intact.  moves all 4 extremities w/o difficulty. Affect pleasant  Telemetry   NSR 80s, Personally reviewed  Labs    CBC Recent Labs    01/24/20 0420 01/25/20 0439  WBC 1.6* 2.5*  HGB 9.2* 8.7*  HCT 30.6* 28.2*  MCV 106.3* 105.2*  PLT 23* 26*   Basic Metabolic Panel Recent Labs    01/24/20 0420 01/25/20 0439  NA 138 137  K 4.3 5.1  CL 105 107  CO2 24 22  GLUCOSE 146* 137*  BUN 43* 50*  CREATININE 3.12* 2.90*  CALCIUM 8.9 8.6*  MG 2.3 2.2  PHOS 4.3 4.4   Liver Function Tests Recent Labs    01/22/20 1953 01/23/20 0454 01/24/20 0420 01/25/20 0439  AST 46*  --   --   --   ALT 14  --   --   --   ALKPHOS 381*  --   --   --   BILITOT 2.3*  --   --   --   PROT 6.7  --   --   --   ALBUMIN 2.7*   < > 2.8* 2.7*   < > = values in this interval not displayed.   Recent Labs    01/22/20 1953  LIPASE 45   Cardiac Enzymes No results for input(s): CKTOTAL, CKMB, CKMBINDEX, TROPONINI in the last 72 hours.  BNP: BNP (last 3 results) Recent Labs  02/12/19 2356 05/16/19 1051 01/20/20 1504  BNP 158.1* 1,119.2* 506.0*    ProBNP (last 3 results) Recent Labs    10/11/19 1138 12/08/19 1041 01/11/20 1439  PROBNP 604.0* 710.0* 494.0*     D-Dimer No results for input(s): DDIMER in the last 72 hours. Hemoglobin A1C No results for input(s): HGBA1C in the last 72 hours. Fasting Lipid Panel No results for input(s): CHOL, HDL, LDLCALC, TRIG, CHOLHDL, LDLDIRECT in the last 72 hours. Thyroid Function Tests No results for input(s): TSH, T4TOTAL, T3FREE, THYROIDAB in the last 72 hours.  Invalid input(s): FREET3  Other results:   Imaging    CT HEAD WO CONTRAST  Result Date: 01/24/2020 CLINICAL DATA:  Headache, primarily frontal in location EXAM: CT HEAD WITHOUT CONTRAST TECHNIQUE: Contiguous axial images were obtained from the base of the skull through the vertex without intravenous contrast. COMPARISON:  December 05, 2019 FINDINGS: Brain: Age related volume loss is  stable. There is no intracranial mass, hemorrhage, extra-axial fluid collection, or midline shift. There is slight small vessel disease in the centra semiovale bilaterally. Elsewhere brain parenchyma appears unremarkable. There is no demonstrable acute infarct. Vascular: There is no hyperdense vessel. There is calcification in each carotid siphon region. Skull: Bony calvarium appears intact. Sinuses/Orbits: There is mucosal thickening in multiple ethmoid air cells. Other paranasal sinuses are clear. Orbits appear symmetric bilaterally. Apparent prior cataract removals bilaterally. Other: Mastoid air cells are clear. IMPRESSION: Age related volume loss with mild periventricular small vessel disease. No acute infarct. No mass or hemorrhage. There are foci of arterial vascular calcification. There is mucosal thickening in multiple ethmoid air cells. Electronically Signed   By: Lowella Grip III M.D.   On: 01/24/2020 13:23     Medications:     Scheduled Medications: . allopurinol  200 mg Oral Daily  . Ferrous Fumarate  1 tablet Oral Daily  . folic acid  1 mg Oral Daily  . macitentan  10 mg Oral Daily  . mometasone-formoterol  2 puff Inhalation BID  . multivitamin with minerals  1 tablet Oral Daily  . pantoprazole  40 mg Oral Daily  . potassium chloride  40 mEq Oral BID  . rosuvastatin  5 mg Oral Daily  . Selexipag  1,000 mcg Oral BID  . sodium chloride flush  3 mL Intravenous Q12H  . tadalafil  40 mg Oral Daily  . thiamine  100 mg Oral Daily    Infusions: . sodium chloride      PRN Medications: sodium chloride, albuterol, butalbital-acetaminophen-caffeine, ondansetron (ZOFRAN) IV, oxyCODONE, prochlorperazine, sodium chloride flush, SUMAtriptan   Assessment/Plan   1. RV failure: Patient has RV failure due to portopulmonary hypertension.  Echo this admission with LV EF 60-65%, moderate LVH, severely dilated and severely dysfunctional RV, PASP 83 mmHg.  He has cardiorenal syndrome,  recently torsemide was cut back with rise in creatinine to > 3.  Creatinine initially improved this admit w/ IV diuresis but significant bump yesterday to 3.12 2/2 over diuresis. Diuretics now on hold. SCr improving. Wt stable at 199 lb. No edema on exam.  - Continue to hold diuretics for now and monitor. Plan to add back in next 24 hrs if SCr continues to improve.  2. Pulmonary hypertension: Thought to have portopulmonary hypertension with RV failure.   - Continue home PH meds (Selexipag, tadalafil, and Opsumit).  3. AKI on CKD stage.  SCr peaked to 3.3 this admit. Improved w/ initially w/ IV Lasix but bump from 2.5>>3.12 yesterday, 2/2 over diureses.  -  Diuretics currently on hold. Recieved 500 cc bolus of IVFs yesterday. - SCr improved, 3.12>>2.90. Volume stable off diuretics.  - Continue to hold diuretics today. Monitor BMP  4. Cirrhosis: From ETOH. RUQ [place] 3/22 showed new unchanged cirrhosis and new small ascites. 5. Pancytopenia: This is known from the past.  Thought to be related to cirrhosis/splenic sequestration.  - CBC 3/20 showed hgb 8.8. Fe 54. Sats ratio 16%. Recieved feraheme on 3/22. 6. Headache: - continues w/ HA. Head CT 3/23 negative.   Length of Stay: [address] PA-C  01/25/2020, 11:40 AM  Advanced Heart Failure Team Pager [phone] (M-F; 7a - 4p)  Please contact [facility] for night-coverage after hours (4p -7a ) and weekends on [email or web address]  Patient seen and examined with the above-signed Advanced Practice Provider and/or Housestaff. I personally reviewed laboratory data, imaging studies and relevant notes. I independently examined the patient and formulated the important aspects of the plan. I have edited the note to reflect any of my changes or salient points. I have personally discussed the plan with the patient and/or family.  Continues with severe HA and some nausea. Head CT negative for bleed.   IV lasix held due to AKI. Renal function stable. Volume  status looks ok.   General:  Chronically ill appearing. No resp difficulty HEENT: normal Neck: supple. JVP to jaw with prominent CV waves Carotids 2+ bilat; no bruits. No lymphadenopathy or thryomegaly appreciated. Cor: PMI nondisplaced. Regular rate & rhythm. 2/6 TR Lungs: clear Abdomen: soft, nontender, nondistended. No hepatosplenomegaly. No bruits or masses. Good bowel sounds. Extremities: no cyanosis, clubbing, rash, edema Neuro: alert & orientedx3, cranial nerves grossly intact. moves all 4 extremities w/o difficulty. Affect pleasant  He has advanced portopulmonary HTN. I think this is about as good as we can get him from hemodynamic standpoint. If he is feeling better tomorrow should be stable for d/c.   Glori Bickers, MD  5:46 PM

## 2020-01-25 NOTE — Progress Notes (Signed)
PROGRESS NOTE  Darren Mueller UEA:[id number] DOB: 1951-04-01   PCP: [name], MD  Patient is from: Home.  Independently ambulates at baseline.  DOA: 01/20/2020 LOS: 5  Brief Narrative / Interim history: 69 year old male with history of RV failure/severe P HTN, EtOH cirrhosis, esophageal varices, minimal CADon R/LHC, CKD-3A, COPD, HTN and pancytopenia presented to ED from PCP office with CHF exacerbation.  Heart shortness of breath, increased edema and about 6 pounds weight gain in 1 week.  In ED, HDS and normal saturation on RA.  BNP 506 (dry BNP 158). Cr 2.8 (b/l 2-3).  Hgb 8.8 (baseline).  WBC 1.8 (about baseline). HS trop 33.  EKG sinus rhythm with diffuse T wave changes.  CXR with cardiomegaly and central vascular congestion.  Received IV Lasix 80 mg once, and admitted for CHF exacerbation.  Cardiology (advanced heart failure team) consulted.  Echocardiogram with EF of 60 to 65%, moderate LVH, D-shaped IVS, severe RV dilation and dysfunction, PASP of 83 and severely dilated IVC.   Subjective: Seen and examined earlier this morning.  No major events overnight of this morning.  Continues to complain frontal headache that he describes as tightness.  Rates his pain 9/10.  No vision change, neck stiffness or focal neuro deficit.  No photophobia or phonophobia.   Later in the morning, headache resolved with p.o. Imitrex.  Her nausea and emesis.  Nausea resolved after emesis.  Objective: Vitals:   01/24/20 2004 01/25/20 0415 01/25/20 0719 01/25/20 1147  BP:  134/77  (!) 152/92  Pulse: 74 72  80  Resp: 18 20  14   Temp:  (!) 97.5 F (36.4 C)  97.6 F (36.4 C)  TempSrc:  Oral  Oral  SpO2: 100% 94% 93% 98%  Weight:  90.7 kg    Height:        Intake/Output Summary (Last 24 hours) at 01/25/2020 1422 Last data filed at 01/25/2020 0940 Gross per 24 hour  Intake 820 ml  Output 400 ml  Net 420 ml   Filed Weights   01/23/20 0559 01/24/20 0512 01/25/20 0415  Weight: 91.9 kg 90.4  kg 90.7 kg    Examination:  GENERAL: No acute distress.  Appears well.  HEENT: MMM.  EOMI. NECK: Supple.  Prominent JVD. RESP: On RA.  No IWOB.  Fair aeration bilaterally.  No crackles. CVS:  RRR.  Louder S2 sound. ABD/GI/GU: Bowel sounds present. Soft. Non tender.  MSK/EXT:  Moves extremities. No apparent deformity.  2+ edema bilaterally. SKIN: no apparent skin lesion or wound NEURO: Awake, alert and oriented appropriately.  EOMI.  Symmetric face.  No apparent focal neuro deficit. PSYCH: Calm. Normal affect.   Procedures:  None  Assessment & Plan: Acute on Chronic diastolic/RV failure/severe PAH-Echo with EF of 60 to 65%, moderate LVH, D-shaped IVS, severe RV dilation and dysfunction, PASP of 83 and severely dilated IVC. RRR/LHC in 2017 with minimal CAD and moderate P HTN. BNP higher than baseline.  Responded to IV Lasix.  About 650 cc UOP/24 hours charted.  Creatinine seems to be improving now after holding diuretics. -Advanced HF team managing-Lasix on hold.  -Monitor fluid status, renal function and electrolytes -TED hose for lower extremities -Sodium and fluid restrictions  -Continue home PH meds (Selexipag, tadalafil, and Opsumit) per cardiology  Severe episodic headache-occasionally improves with sitting up.  Neuro exam reassuring.  CT head without contrast without acute finding.  Migrainous element at times, with nausea and vomiting. -P.o. Imitrex 50 mg every 2 hours as  needed.  Fioricet as needed  RUQ pain-suspect this to be from congestive hepatopathy in the setting of severe RV failure.  RUQ [place] with known cirrhosis and new small ascites. -Liver Doppler?  Elevated troponin due to demand ischemia in the setting of CHF exacerbation: Pattern not consistent with ACS.  No chest pain. -Manage CHF as above.   AKI on CKD IIIb/azotemia: Cr 2.13 on 3/10> 3.33 on 3/15> 2.80> 2.51> 3.13> 2.9.  BUN in 60> 46> 43> 50.  Improved with gentle IV fluid and holding diuretics. -Diuretics  on hold -Continue monitoring  Hypokalemia/hypomagnesemia -Monitor and replenish  First-degree AV block-not on nodal blocking agents. -Continue monitoring  ETOH Cirrhosis/portal gastropathy/varices:Stable. -Diuretics as above  History of seizure? - Continue Keppra. Follows with Dr. Karel Jarvis  Chronic COPD: Stable. -Continue home breathing treatments   OSA not on CPAP: was not able to tolerate CPAP due to claustrophobia   Pancytopenia: Chronic.  Likely due to EtOH, cirrhosis and splenic sequestration.  Improved. -Gave IV Feraheme x1 on 3/22 -Continue monitoring-no signs of bleeding -Continue Hemocyte and folic acid                 DVT prophylaxis: SCD in the setting of thrombocytopenia Code Status: Full code Family Communication: Updated patient's sister at bedside on 3/23.Marland Kitchen  Discharge barrier: Evaluation and treatment of RV failure, AKI Patient is from: Home Final disposition: Likely home in the next 3 to 4 days when stable and cleared by cardiology  Consultants: Advanced HF team   Microbiology summarized: COVID-19 negative.  Sch Meds:  Scheduled Meds: . allopurinol  200 mg Oral Daily  . Ferrous Fumarate  1 tablet Oral Daily  . folic acid  1 mg Oral Daily  . macitentan  10 mg Oral Daily  . mometasone-formoterol  2 puff Inhalation BID  . multivitamin with minerals  1 tablet Oral Daily  . pantoprazole  40 mg Oral Daily  . rosuvastatin  5 mg Oral Daily  . Selexipag  1,000 mcg Oral BID  . sodium chloride flush  3 mL Intravenous Q12H  . tadalafil  40 mg Oral Daily  . thiamine  100 mg Oral Daily   Continuous Infusions: . sodium chloride     PRN Meds:.sodium chloride, albuterol, butalbital-acetaminophen-caffeine, ondansetron (ZOFRAN) IV, oxyCODONE, prochlorperazine, sodium chloride flush, SUMAtriptan  Antimicrobials: Anti-infectives (From admission, onward)   None       I have personally reviewed the following labs and images: CBC: Recent Labs    Lab 01/20/20 1503 01/24/20 0420 01/25/20 0439  WBC 1.8* 1.6* 2.5*  NEUTROABS 1.0*  --   --   HGB 8.8* 9.2* 8.7*  HCT 29.0* 30.6* 28.2*  MCV 107.4* 106.3* 105.2*  PLT 30* 23* 26*   BMP &GFR Recent Labs  Lab 01/22/20 0428 01/22/20 1953 01/23/20 0454 01/24/20 0420 01/25/20 0439  NA 138 139 140 138 137  K 3.3* 3.8 3.7 4.3 5.1  CL 103 106 106 105 107  CO2 23 23 24 24 22   GLUCOSE 118* 131* 114* 146* 137*  BUN 46* 43* 42* 43* 50*  CREATININE 2.51* 2.47* 2.55* 3.12* 2.90*  CALCIUM 8.4* 8.6* 8.6* 8.9 8.6*  MG 2.0 2.0 1.9 2.3 2.2  PHOS 2.7  --  3.5 4.3 4.4   Estimated Creatinine Clearance: 26.4 mL/min (A) (by C-G formula based on SCr of 2.9 mg/dL (H)). Liver & Pancreas: Recent Labs  Lab 01/20/20 1503 01/20/20 1503 01/22/20 0428 01/22/20 1953 01/23/20 0454 01/24/20 0420 01/25/20 0439  AST 45*  --   --  46*  --   --   --   ALT 12  --   --  14  --   --   --   ALKPHOS 418*  --   --  381*  --   --   --   BILITOT 1.9*  --   --  2.3*  --   --   --   PROT 7.1  --   --  6.7  --   --   --   ALBUMIN 3.0*   < > 2.5* 2.7* 2.6* 2.8* 2.7*   < > = values in this interval not displayed.   Recent Labs  Lab 01/22/20 1953  LIPASE 45   No results for input(s): AMMONIA in the last 168 hours. Diabetic: No results for input(s): HGBA1C in the last 72 hours. No results for input(s): GLUCAP in the last 168 hours. Cardiac Enzymes: No results for input(s): CKTOTAL, CKMB, CKMBINDEX, TROPONINI in the last 168 hours. Recent Labs    10/11/19 1138 12/08/19 1041 01/11/20 1439  PROBNP 604.0* 710.0* 494.0*   Coagulation Profile: No results for input(s): INR, PROTIME in the last 168 hours. Thyroid Function Tests: No results for input(s): TSH, T4TOTAL, FREET4, T3FREE, THYROIDAB in the last 72 hours. Lipid Profile: No results for input(s): CHOL, HDL, LDLCALC, TRIG, CHOLHDL, LDLDIRECT in the last 72 hours. Anemia Panel: No results for input(s): VITAMINB12, FOLATE, FERRITIN, TIBC, IRON,  RETICCTPCT in the last 72 hours. Urine analysis:    Component Value Date/Time   COLORURINE YELLOW 11/18/2017 0250   APPEARANCEUR CLEAR 11/18/2017 0250   LABSPEC 1.008 11/18/2017 0250   PHURINE 5.0 11/18/2017 0250   GLUCOSEU NEGATIVE 11/18/2017 0250   HGBUR NEGATIVE 11/18/2017 0250   BILIRUBINUR neg 12/18/2017 1352   KETONESUR NEGATIVE 11/18/2017 0250   PROTEINUR net 12/18/2017 1352   PROTEINUR NEGATIVE 11/18/2017 0250   UROBILINOGEN 0.2 12/18/2017 1352   UROBILINOGEN 0.2 10/18/2013 1247   NITRITE neg 12/18/2017 1352   NITRITE NEGATIVE 11/18/2017 0250   LEUKOCYTESUR Negative 12/18/2017 1352   Sepsis Labs: Invalid input(s): PROCALCITONIN, LACTICIDVEN  Microbiology: Recent Results (from the past 240 hour(s))  SARS CORONAVIRUS 2 (TAT 6-24 HRS) Nasopharyngeal Nasopharyngeal Swab     Status: None   Collection Time: 01/20/20  7:29 PM   Specimen: Nasopharyngeal Swab  Result Value Ref Range Status   SARS Coronavirus 2 NEGATIVE NEGATIVE Final    Comment: (NOTE) SARS-CoV-2 target nucleic acids are NOT DETECTED. The SARS-CoV-2 RNA is generally detectable in upper and lower respiratory specimens during the acute phase of infection. Negative results do not preclude SARS-CoV-2 infection, do not rule out co-infections with other pathogens, and should not be used as the sole basis for treatment or other patient management decisions. Negative results must be combined with clinical observations, patient history, and epidemiological information. The expected result is Negative. Fact Sheet for Patients: HairSlick.no Fact Sheet for Healthcare Providers: quierodirigir.com This test is not yet approved or cleared by the [place] FDA and  has been authorized for detection and/or diagnosis of SARS-CoV-2 by FDA under an Emergency Use Authorization (EUA). This EUA will remain  in effect (meaning this test can be used) for the duration of  the COVID-19 declaration under Section 56 4(b)(1) of the Act, 21 U.S.C. section 360bbb-3(b)(1), unless the authorization is terminated or revoked sooner. Performed at [facility], [address], [place], [place] [address]     Radiology Studies: No results found.   [facility]  If 7PM-7AM, please contact night-coverage [email or web address] Password [email or web address] 01/25/2020, 2:22 PM

## 2020-01-25 NOTE — Progress Notes (Signed)
Occupational Therapy Treatment Patient Details Name: Darren Mueller MRN: [id number] DOB: 1951-01-20 Today's Date: 01/25/2020    History of present illness 69 y.o. male with PMH significant for portopulmonary hypertension with RV failure, ETOH cirrhosis,Grade 1, small esophageal varices, CAD: R/L cath 6/17 noting minimal CAD with moderate PAH and preserved cardiac output, CKDIIIa, COPD,  HTN, HLD, vertigo, seizures, gout. herat murmur and pancytopenia who presents to the ED in referral from pcp due concern for CHF exacerbation with  history of worsening shortness of breath, and increase lower extremity swelling. Patient notes associated weight gain of around 6lb over the last one week and dry cough.    OT comments  Educated pt in use of AE for LB ADL and energy conservation strategies (reinforced with written handout). Pt receptive. Pt is routinely performing ADL and mobility without assistance in his room/bathroom with the exception of LB bathing and dressing. Pt vomited at end of session, notified RN.   Follow Up Recommendations  No OT follow up;Supervision - Intermittent    Equipment Recommendations  3 in 1 bedside commode    Recommendations for Other Services      Precautions / Restrictions Precautions Precautions: None Restrictions Weight Bearing Restrictions: No       Mobility Bed Mobility Overal bed mobility: Modified Independent                Transfers Overall transfer level: Modified independent Equipment used: None                  Balance                                           ADL either performed or assessed with clinical judgement   ADL Overall ADL's : Needs assistance/impaired     Grooming: Modified independent         Lower Body Bathing Details (indicate cue type and reason): recommended long handled bath sponge     Lower Body Dressing: Moderate assistance;Sit to/from stand Lower Body Dressing Details (indicate  cue type and reason): educate in use of AE for LB dressing, slip on shoes Toilet Transfer: Modified Independent           Functional mobility during ADLs: Modified independent General ADL Comments: Educated in energy conservation and provided written handout.     Vision       Perception     Praxis      Cognition Arousal/Alertness: Awake/alert Behavior During Therapy: WFL for tasks assessed/performed Overall Cognitive Status: Within Functional Limits for tasks assessed                                          Exercises     Shoulder Instructions       General Comments      Pertinent Vitals/ Pain       Pain Assessment: No/denies pain  Home Living                                          Prior Functioning/Environment              Frequency  Min 2X/week  Progress Toward Goals  OT Goals(current goals can now be found in the care plan section)  Progress towards OT goals: Progressing toward goals  Acute Rehab OT Goals Patient Stated Goal: get home OT Goal Formulation: With patient Time For Goal Achievement: 02/04/20 Potential to Achieve Goals: Good  Plan Discharge plan remains appropriate    Co-evaluation                 AM-PAC OT "6 Clicks" Daily Activity     Outcome Measure   Help from another person eating meals?: None Help from another person taking care of personal grooming?: None Help from another person toileting, which includes using toliet, bedpan, or urinal?: None Help from another person bathing (including washing, rinsing, drying)?: A Little Help from another person to put on and taking off regular upper body clothing?: None Help from another person to put on and taking off regular lower body clothing?: A Little 6 Click Score: 22    End of Session    OT Visit Diagnosis: Other abnormalities of gait and mobility (R26.89);Other (comment)   Activity Tolerance Treatment limited secondary  to medical complications (Comment)(vomiting at end of session)   Patient Left in bed;with call bell/phone within reach   Nurse Communication Other (comment)(made aware pt is vomiting)        Time: [id number] OT Time Calculation (min): 18 min  Charges: OT General Charges $OT Visit: 1 Visit OT Treatments $Self Care/Home Management : 8-22 mins  Nestor Lewandowsky, OTR/L Acute Rehabilitation Services Pager: [phone] Office: [phone]   Malka So 01/25/2020, 11:43 AM

## 2020-01-26 DIAGNOSIS — G43D0 Abdominal migraine, not intractable: Secondary | ICD-10-CM

## 2020-01-26 DIAGNOSIS — K7031 Alcoholic cirrhosis of liver with ascites: Secondary | ICD-10-CM

## 2020-01-26 DIAGNOSIS — R7989 Other specified abnormal findings of blood chemistry: Secondary | ICD-10-CM

## 2020-01-26 LAB — RENAL FUNCTION PANEL
Albumin: 2.6 g/dL — ABNORMAL LOW (ref 3.5–5.0)
Anion gap: 8 (ref 5–15)
BUN: 46 mg/dL — ABNORMAL HIGH (ref 8–23)
CO2: 23 mmol/L (ref 22–32)
Calcium: 8.5 mg/dL — ABNORMAL LOW (ref 8.9–10.3)
Chloride: 106 mmol/L (ref 98–111)
Creatinine, Ser: 2.71 mg/dL — ABNORMAL HIGH (ref 0.61–1.24)
GFR calc Af Amer: 27 mL/min — ABNORMAL LOW (ref >60)
GFR calc non Af Amer: 23 mL/min — ABNORMAL LOW (ref >60)
Glucose, Bld: 91 mg/dL (ref 70–99)
Phosphorus: 3.3 mg/dL (ref 2.5–4.6)
Potassium: 4.4 mmol/L (ref 3.5–5.1)
Sodium: 137 mmol/L (ref 135–145)

## 2020-01-26 LAB — CBC
HCT: 27.7 % — ABNORMAL LOW (ref 39.0–52.0)
Hemoglobin: 8.4 g/dL — ABNORMAL LOW (ref 13.0–17.0)
MCH: 32.2 pg (ref 26.0–34.0)
MCHC: 30.3 g/dL (ref 30.0–36.0)
MCV: 106.1 fL — ABNORMAL HIGH (ref 80.0–100.0)
Platelets: 26 10*3/uL — CL (ref 150–400)
RBC: 2.61 MIL/uL — ABNORMAL LOW (ref 4.22–5.81)
RDW: 18.1 % — ABNORMAL HIGH (ref 11.5–15.5)
WBC: 3.2 10*3/uL — ABNORMAL LOW (ref 4.0–10.5)
nRBC: 0.9 % — ABNORMAL HIGH (ref 0.0–0.2)

## 2020-01-26 LAB — MAGNESIUM: Magnesium: 2.3 mg/dL (ref 1.7–2.4)

## 2020-01-26 MED ORDER — TORSEMIDE 20 MG PO TABS: 60.0000 mg | ORAL_TABLET | Freq: Two times a day (BID) | ORAL | 5 refills | Status: DC

## 2020-01-26 MED ORDER — SUMATRIPTAN SUCCINATE 50 MG PO TABS: 50.0000 mg | ORAL_TABLET | Freq: Every day | ORAL | 2 refills | Status: DC | PRN

## 2020-01-26 MED ORDER — TORSEMIDE 20 MG PO TABS: 60.0000 mg | ORAL_TABLET | Freq: Two times a day (BID) | ORAL | Status: DC

## 2020-01-26 MED ORDER — POTASSIUM CHLORIDE ER 20 MEQ PO TBCR: 40.0000 meq | EXTENDED_RELEASE_TABLET | Freq: Every day | ORAL | 1 refills | Status: DC

## 2020-01-26 MED FILL — POTASSIUM CHLORIDE 20meqER: 20 | 45 days supply | Qty: 90 | Fill #0

## 2020-01-26 MED FILL — TORSEMIDE 20 MG TABLET: 20 | 30 days supply | Qty: 180 | Fill #0

## 2020-01-26 NOTE — Care Management (Signed)
Notified [facility] [place]

## 2020-01-26 NOTE — Discharge Summary (Signed)
Physician Discharge Summary  Darren Mueller JJO:[id number] DOB: 1951/09/25 DOA: 01/20/2020  PCP: Mosie Lukes, MD  Admit date: 01/20/2020 Discharge date: 01/26/2020  Admitted From: Home Disposition: Home  Recommendations for Outpatient Follow-up:  1. Follow ups as below. 2. Please obtain CBC/BMP/Mag at follow up 3. Please follow up on the following pending results: None  Home Health: RN Equipment/Devices: 3 in 1 commode  Discharge Condition: Stable CODE STATUS: Full code  [facility] Follow up.   Specialty: Home Health Services Why: [place] information: [place] [address] [phone]        Mosie Lukes, MD Follow up on 02/10/2020.   Specialty: Family Medicine Why: 9:00 am for virtual apt ,  Contact information: [address] [place] [address] [place] Follow up on 02/09/2020.   Specialty: Cardiology Why: 10:00 AM [facility] Parking Garage Code 5009 Contact information: [address] [id number] [place] [phone]          Hospital Course: 69 year old male with history of RV failure/severe P HTN, EtOH cirrhosis, esophageal varices, minimal CADon R/LHC, CKD-3A, COPD, HTN and pancytopenia presented to ED from PCP office with CHF exacerbation.  Heart shortness of breath, increased edema and about 6 pounds weight gain in 1 week.  In ED, HDS and normal saturation on RA.  BNP 506 (dry BNP 158). Cr 2.8 (b/l 2-3).  Hgb 8.8 (baseline).  WBC 1.8 (about baseline). HS trop 33.  EKG sinus rhythm with diffuse T wave changes.  CXR with cardiomegaly and central vascular congestion.  Received IV Lasix 80 mg once, and admitted for CHF exacerbation.  Cardiology (advanced heart failure team) consulted.  Echocardiogram with EF of 60 to 65%, moderate  LVH, D-shaped IVS, severe RV dilation and dysfunction, PASP of 83 and severely dilated IVC.   Initially, patient responded to IV Lasix.  He later developed AKI.  Diuretics held.  Renal function recovered.  Discharge home on torsemide 60 mg twice daily starting 01/28/2020 per cardiology recommendation.  Hospital course also complicated by episodic headache, nausea and emesis likely from severe RV failure with portopulmonary congestion.  CT head negative for acute finding or hemorrhage.  Headache improved with Imitrex.  Nausea vomiting resolved.  Evaluated by PT/OT who recommended 3 in 1 commode.  [facility] RN ordered for observation and monitoring at home.  See individual problem list below for more hospital course.  Discharge Diagnoses:  Acute on Chronic diastolic/RV failure/severe PAH-Echo with EF of 60 to 65%, moderate LVH, D-shaped IVS, severe RV dilation and dysfunction, PASP of 83 and severely dilated IVC. RRR/LHC in 2017 with minimal CAD and moderate P HTN. BNP higher than baseline.  Responded to IV Lasix but developed AKI.  Diuretics held.  Renal function improved and close to baseline.  Weight down from 223 pounds to 199 pounds.  Cleared for discharge by advanced HF team on: Opsumit 10 mg daily  Uptravi 1000 mg bid Tadalafil 40 mg daily  Torsemide 60 mg bid starting on 01/28/2020 Crestor 5 mg qhs  K-Dur 40 mg daily starting on 01/28/2020  -Counseled on sodium and fluid restriction.  Recommended alcohol cessation. -Outpatient follow-up as above -Home health RN and 3 in 1 commode  Severe episodic headache-occasionally improves with sitting up.  Neuro exam reassuring.  CT head without contrast without acute finding.  Migrainous element at times, with nausea and vomiting. -P.o. Imitrex 50 mg daily as needed, #10.  RUQ pain-suspect this to be from congestive hepatopathy in the setting of severe RV failure.  RUQ [place] with known cirrhosis and new small ascites.  Resolved.  Elevated troponin due  to demand ischemia in the setting of CHF exacerbation: Pattern not consistent with ACS.  No chest pain.  AKI on CKD IIIb/azotemia: Cr 2.13 on 3/10> 3.33 on 3/15> 2.80> 2.51> 3.13> 2.9> 2.71.  BUN in 60>> 46.  Improved with gentle IV fluid and holding diuretics. -Recheck BMP and magnesium at follow-up.  Hypokalemia/hypomagnesemia: Resolved. -Recheck at follow-up.  First-degree AV block-not on nodal blocking agents.  ETOH Cirrhosis/portal gastropathy/varices:Stable. -Counseled on alcohol cessation.  History of seizure? - Continue Keppra. Follows with Dr. Delice Lesch  Chronic COPD: Stable. -Continue home medications.  OSA not on CPAP: was not able to tolerate CPAP due to claustrophobia  Pancytopenia: Chronic.  Likely due to EtOH, cirrhosis and splenic sequestration.  Leukopenia improved.  Thrombocytopenia and H&H relatively stable. -Gave IV Feraheme x1 on 3/22 -Counseled on alcohol cessation. -Recheck CBC at follow-up     Discharge Instructions  Discharge Instructions    (HEART FAILURE PATIENTS) Call MD:  Anytime you have any of the following symptoms: 1) 3 pound weight gain in 24 hours or 5 pounds in 1 week 2) shortness of breath, with or without a dry hacking cough 3) swelling in the hands, feet or stomach 4) if you have to sleep on extra pillows at night in order to breathe.   Complete by: As directed    Call MD for:  difficulty breathing, headache or visual disturbances   Complete by: As directed    Call MD for:  extreme fatigue   Complete by: As directed    Call MD for:  persistant dizziness or light-headedness   Complete by: As directed    Call MD for:  persistant nausea and vomiting   Complete by: As directed    Diet - low sodium heart healthy   Complete by: As directed    Discharge instructions   Complete by: As directed    It has been a pleasure taking care of you! You were hospitalized with heart failure exacerbation for which you were treated with fluid  medications.  We have made some adjustment to your heart medications during this hospitalization. Please review your new medication list and the directions before you take your medications.   We strongly recommend you avoid alcohol which would be bad for your liver and your heart.  We recommend you limit your fluid intake to less than 1500 cc a day.  We also recommend limiting your sodium intake to less than 2 g (2000 mg) a day.  Your cardiologist will get in touch with you to arrange outpatient follow-up.   Take care,   Increase activity slowly   Complete by: As directed      Allergies as of 01/26/2020      Reactions   Aspirin Hypertension   Nitroglycerin Other (See Comments)   Severe Headache      Medication List    STOP taking these medications   hydrOXYzine 25 MG tablet Commonly known as: ATARAX/VISTARIL   mometasone-formoterol 100-5 MCG/ACT Aero Commonly known as: DULERA   Potassium Chloride ER 20 MEQ Tbcr     TAKE these medications   acetaminophen 500 MG tablet Commonly known as: TYLENOL Take 1,000 mg by  mouth every 6 (six) hours as needed (for pain.).   Advair HFA 230-21 MCG/ACT inhaler Generic drug: fluticasone-salmeterol USE 2 INHALATIONS EVERY MORNING What changed: See the new instructions.   allopurinol 100 MG tablet Commonly known as: ZYLOPRIM Take 2 tablets (200 mg total) by mouth daily.   Blood Pressure Cuff Misc Use as directed. Check blood pressure bid prn Dx:I10   diphenoxylate-atropine 2.5-0.025 MG tablet Commonly known as: Lomotil Take 1 tablet by mouth 4 (four) times daily as needed for diarrhea or loose stools.   Ferrous Fumarate 324 (106 Fe) MG Tabs tablet Commonly known as: Hemocyte Take 1 tablet (106 mg of iron total) by mouth daily.   folic acid 1 MG tablet Commonly known as: FOLVITE Take 1 tablet (1 mg total) by mouth daily.   hyoscyamine 0.125 MG SL tablet Commonly known as: LEVSIN SL Place 1 tablet (0.125 mg total) under the  tongue every 6 (six) hours as needed. What changed: reasons to take this   multivitamin tablet Take 1 tablet by mouth daily.   omeprazole 40 MG capsule Commonly known as: PRILOSEC TAKE 1 CAPSULE BY MOUTH ONCE DAILY   Opsumit 10 MG tablet Generic drug: macitentan Take 1 tablet (10 mg total) by mouth daily.   rosuvastatin 5 MG tablet Commonly known as: CRESTOR TAKE 1 TABLET DAILY What changed: when to take this   tadalafil (PAH) 20 MG tablet Commonly known as: ADCIRCA Take 2 tablets (40 mg total) by mouth daily.   thiamine 100 MG tablet Take 1 tablet (100 mg total) by mouth daily.   torsemide 20 MG tablet Commonly known as: DEMADEX Take 3 tablets (60 mg total) by mouth 2 (two) times daily. Start taking on: January 28, 2020 What changed: These instructions start on January 28, 2020. If you are unsure what to do until then, ask your doctor or other care provider.   traZODone 50 MG tablet Commonly known as: DESYREL Take 0.5-1 tablets (25-50 mg total) by mouth at bedtime as needed for sleep.   Uptravi 1000 MCG Tabs Generic drug: Selexipag Take 1,000 mcg by mouth 2 (two) times daily.       Consultations:  Advanced heart failure team  Procedures/Studies:  2D Echo on 01/21/2020 1. Left ventricular ejection fraction, by estimation, is 60 to 65%. The  left ventricle has normal function. The left ventricle has no regional  wall motion abnormalities. There is moderate left ventricular hypertrophy.  Left ventricular diastolic  parameters are consistent with Grade II diastolic dysfunction  (pseudonormalization). There is the interventricular septum is flattened  in systole and diastole, consistent with right ventricular pressure and  volume overload.  2. Prominent RV trabeculation. Right ventricular systolic function is  severely reduced. The right ventricular size is severely enlarged. mildly  increased right ventricular wall thickness. There is severely elevated  pulmonary  artery systolic pressure.  The estimated right ventricular systolic pressure is 54.5 mmHg.  3. Left atrial size was mildly dilated.  4. Right atrial size was severely dilated.  5. The mitral valve is grossly normal. Trivial mitral valve  regurgitation.  6. Tricuspid valve regurgitation is moderate.  7. The aortic valve is tricuspid. Aortic valve regurgitation is mild.  8. The inferior vena cava is dilated in size with <50% respiratory  variability, suggesting right atrial pressure of 15 mmHg.    DG Chest 2 View  Result Date: 01/11/2020 CLINICAL DATA:  Pedal edema. Shortness of breath and cough. 10 pound weight gain over 1 month. History of  CHF. EXAM: CHEST - 2 VIEW COMPARISON:  Radiograph 12/08/2019. CT 12/24/2015 FINDINGS: Chronic cardiomegaly. Unchanged mediastinal contours. Chronic central vascular congestion. Minimal increase interstitial thickening from prior exam. No significant pleural effusion. No focal airspace disease. No pneumothorax. Implanted loop recorder projects over the left chest wall. No acute osseous abnormalities are seen. IMPRESSION: Chronic cardiomegaly and central vascular congestion. Minimal increase in interstitial thickening from prior exam, suspicious for mild pulmonary edema. Electronically Signed   By: Keith Rake M.D.   On: 01/11/2020 15:21   CT HEAD WO CONTRAST  Result Date: 01/24/2020 CLINICAL DATA:  Headache, primarily frontal in location EXAM: CT HEAD WITHOUT CONTRAST TECHNIQUE: Contiguous axial images were obtained from the base of the skull through the vertex without intravenous contrast. COMPARISON:  December 05, 2019 FINDINGS: Brain: Age related volume loss is stable. There is no intracranial mass, hemorrhage, extra-axial fluid collection, or midline shift. There is slight small vessel disease in the centra semiovale bilaterally. Elsewhere brain parenchyma appears unremarkable. There is no demonstrable acute infarct. Vascular: There is no hyperdense  vessel. There is calcification in each carotid siphon region. Skull: Bony calvarium appears intact. Sinuses/Orbits: There is mucosal thickening in multiple ethmoid air cells. Other paranasal sinuses are clear. Orbits appear symmetric bilaterally. Apparent prior cataract removals bilaterally. Other: Mastoid air cells are clear. IMPRESSION: Age related volume loss with mild periventricular small vessel disease. No acute infarct. No mass or hemorrhage. There are foci of arterial vascular calcification. There is mucosal thickening in multiple ethmoid air cells. Electronically Signed   By: Lowella Grip III M.D.   On: 01/24/2020 13:23   DG Chest Port 1 View  Result Date: 01/20/2020 CLINICAL DATA:  Shortness of breath and chest pain EXAM: PORTABLE CHEST 1 VIEW COMPARISON:  01/11/2020, 12/08/2019 FINDINGS: Cardiomegaly with mild central congestion. No consolidation, pleural effusion or pneumothorax. Recording device over the left lower chest. IMPRESSION: Cardiomegaly with mild central vascular congestion Electronically Signed   By: Donavan Foil M.D.   On: 01/20/2020 15:01   ECHOCARDIOGRAM COMPLETE  Result Date: 01/21/2020    ECHOCARDIOGRAM REPORT   Patient Name:   Darren Mueller Date of Exam: 01/21/2020 Medical Rec #:  [id number]         Height:       72.0 in Accession #:    [id number]        Weight:       213.1 lb Date of Birth:  July 20, 1951          BSA:          2.189 m Patient Age:    68 years          BP:           109/65 mmHg Patient Gender: M                 HR:           70 bpm. Exam Location:  Inpatient Procedure: 2D Echo, Cardiac Doppler and Color Doppler Indications:    Abnormal ECG 794.31/R94.31  History:        Patient has prior history of Echocardiogram examinations, most                 recent 05/17/2019. CHF, Pulmonary HTN; Risk Factors:Hypertension,                 Former Smoker and Sleep Apnea. PAH.  Sonographer:    Clayton Lefort RDCS (AE) Referring Phys: [id number] [place]  Comments: Suboptimal apical window. IMPRESSIONS  1. Left ventricular ejection fraction, by estimation, is 60 to 65%. The left ventricle has normal function. The left ventricle has no regional wall motion abnormalities. There is moderate left ventricular hypertrophy. Left ventricular diastolic parameters are consistent with Grade II diastolic dysfunction (pseudonormalization). There is the interventricular septum is flattened in systole and diastole, consistent with right ventricular pressure and volume overload.  2. Prominent RV trabeculation. Right ventricular systolic function is severely reduced. The right ventricular size is severely enlarged. mildly increased right ventricular wall thickness. There is severely elevated pulmonary artery systolic pressure. The estimated right ventricular systolic pressure is 92.4 mmHg.  3. Left atrial size was mildly dilated.  4. Right atrial size was severely dilated.  5. The mitral valve is grossly normal. Trivial mitral valve regurgitation.  6. Tricuspid valve regurgitation is moderate.  7. The aortic valve is tricuspid. Aortic valve regurgitation is mild.  8. The inferior vena cava is dilated in size with <50% respiratory variability, suggesting right atrial pressure of 15 mmHg. FINDINGS  Left Ventricle: Left ventricular ejection fraction, by estimation, is 60 to 65%. The left ventricle has normal function. The left ventricle has no regional wall motion abnormalities. The left ventricular internal cavity size was small. There is moderate  left ventricular hypertrophy. The interventricular septum is flattened in systole and diastole, consistent with right ventricular pressure and volume overload. Left ventricular diastolic parameters are consistent with Grade II diastolic dysfunction (pseudonormalization). Right Ventricle: Prominent RV trabeculation. The right ventricular size is severely enlarged. Mildly increased right ventricular wall thickness. Right ventricular systolic  function is severely reduced. There is severely elevated pulmonary artery systolic  pressure. The tricuspid regurgitant velocity is 4.13 m/s, and with an assumed right atrial pressure of 15 mmHg, the estimated right ventricular systolic pressure is 46.2 mmHg. Left Atrium: Left atrial size was mildly dilated. Right Atrium: Right atrial size was severely dilated. Pericardium: A small pericardial effusion is present. The pericardial effusion is posterior to the left ventricle. Mitral Valve: The mitral valve is grossly normal. There is mild thickening of the mitral valve leaflet(s). Mild mitral annular calcification. Trivial mitral valve regurgitation. MV peak gradient, 5.6 mmHg. The mean mitral valve gradient is 2.0 mmHg. Tricuspid Valve: The tricuspid valve is grossly normal. Tricuspid valve regurgitation is moderate. Aortic Valve: The aortic valve is tricuspid. Aortic valve regurgitation is mild. Aortic regurgitation PHT measures 456 msec. Mild aortic valve annular calcification. Aortic valve mean gradient measures 9.0 mmHg. Aortic valve peak gradient measures 17.6 mmHg. Aortic valve area, by VTI measures 1.98 cm. Pulmonic Valve: The pulmonic valve was grossly normal. Pulmonic valve regurgitation is trivial. Aorta: The aortic root is normal in size and structure. Venous: The inferior vena cava is dilated in size with less than 50% respiratory variability, suggesting right atrial pressure of 15 mmHg. IAS/Shunts: No atrial level shunt detected by color flow Doppler.  LEFT VENTRICLE PLAX 2D LVIDd:         4.00 cm  Diastology LVIDs:         2.60 cm  LV e' lateral:   5.98 cm/s LV PW:         1.60 cm  LV E/e' lateral: 13.8 LV IVS:        2.30 cm  LV e' medial:    6.96 cm/s LVOT diam:     2.10 cm  LV E/e' medial:  11.9 LV SV:         85 LV SV Index:  39 LVOT Area:     3.46 cm  RIGHT VENTRICLE             IVC RV Basal diam:  3.60 cm     IVC diam: 3.00 cm RV S prime:     13.80 cm/s TAPSE (M-mode): 2.6 cm LEFT ATRIUM              Index       RIGHT ATRIUM           Index LA diam:        4.50 cm 2.06 cm/m  RA Area:     32.50 cm LA Vol (A2C):   56.5 ml 25.81 ml/m RA Volume:   117.00 ml 53.45 ml/m LA Vol (A4C):   80.0 ml 36.55 ml/m LA Biplane Vol: 68.6 ml 31.34 ml/m  AORTIC VALVE AV Area (Vmax):    1.92 cm AV Area (Vmean):   2.09 cm AV Area (VTI):     1.98 cm AV Vmax:           209.67 cm/s AV Vmean:          140.667 cm/s AV VTI:            0.427 m AV Peak Grad:      17.6 mmHg AV Mean Grad:      9.0 mmHg LVOT Vmax:         116.00 cm/s LVOT Vmean:        84.900 cm/s LVOT VTI:          0.244 m LVOT/AV VTI ratio: 0.57 AI PHT:            456 msec  AORTA Ao Root diam: 3.30 cm Ao Asc diam:  3.80 cm MITRAL VALVE               TRICUSPID VALVE MV Area (PHT): 2.34 cm    TR Peak grad:   68.2 mmHg MV Peak grad:  5.6 mmHg    TR Vmax:        413.00 cm/s MV Mean grad:  2.0 mmHg MV Vmax:       1.18 m/s    SHUNTS MV Vmean:      66.3 cm/s   Systemic VTI:  0.24 m MV Decel Time: 324 msec    Systemic Diam: 2.10 cm MV E velocity: 82.60 cm/s MV A velocity: 96.70 cm/s MV E/A ratio:  0.85 Rozann Lesches MD Electronically signed by Rozann Lesches MD Signature Date/Time: 01/21/2020/4:08:29 PM    Final    CUP PACEART REMOTE DEVICE CHECK  Result Date: 01/02/2020 [facility] summary report received. Battery status OK. Normal device function. 1 symptom event shows SR. No tachy episodes, brady, or pause episodes. No new AF episodes. Monthly summary reports and ROV/PRN  US ABDOMEN LIMITED RUQ  Result Date: 01/23/2020 CLINICAL DATA:  Right upper quadrant pain for the past week. EXAM: ULTRASOUND ABDOMEN LIMITED RIGHT UPPER QUADRANT COMPARISON:  Right upper quadrant ultrasound dated February 14, 2019. FINDINGS: Gallbladder: Contracted gallbladder with diffuse wall thickening measuring up to 5 mm. No gallstones visualized. No sonographic Murphy sign noted by sonographer. Common bile duct: Diameter: 7 mm, at the upper limits of normal. Liver: No focal lesion  identified. Unchanged nodular contour with coarsened heterogeneous parenchymal echogenicity. Portal vein is patent on color Doppler imaging with normal direction of blood flow towards the liver. Other: Small perihepatic ascites. IMPRESSION: 1. No acute abnormality. 2. Unchanged cirrhosis. New small ascites. 3. Contracted gallbladder with mild diffuse wall  thickening likely related to underlying liver disease and ascites. Electronically Signed   By: Titus Dubin M.D.   On: 01/23/2020 15:43       Discharge Exam: Vitals:   01/26/20 0835 01/26/20 1201  BP:  122/75  Pulse:  71  Resp:  18  Temp:  97.8 F (36.6 C)  SpO2: 100% 97%    GENERAL: No acute distress.  Appears well.  HEENT: MMM.  Vision and hearing grossly intact.  NECK: Supple.  Prominent JVD. RESP: On room air.  No IWOB.  Fair air movement.  No crackles. CVS:  RRR. Heart sounds normal.  ABD/GI/GU: Bowel sounds present. Soft. Non tender.  MSK/EXT:  Moves extremities. No apparent deformity.  1+ pitting edema bilaterally. SKIN: no apparent skin lesion or wound NEURO: Awake, alert and oriented appropriately.  No apparent focal neuro deficit. PSYCH: Calm. Normal affect.   The results of significant diagnostics from this hospitalization (including imaging, microbiology, ancillary and laboratory) are listed below for reference.     Microbiology: Recent Results (from the past 240 hour(s))  SARS CORONAVIRUS 2 (TAT 6-24 HRS) Nasopharyngeal Nasopharyngeal Swab     Status: None   Collection Time: 01/20/20  7:29 PM   Specimen: Nasopharyngeal Swab  Result Value Ref Range Status   SARS Coronavirus 2 NEGATIVE NEGATIVE Final    Comment: (NOTE) SARS-CoV-2 target nucleic acids are NOT DETECTED. The SARS-CoV-2 RNA is generally detectable in upper and lower respiratory specimens during the acute phase of infection. Negative results do not preclude SARS-CoV-2 infection, do not rule out co-infections with other pathogens, and should not  be used as the sole basis for treatment or other patient management decisions. Negative results must be combined with clinical observations, patient history, and epidemiological information. The expected result is Negative. Fact Sheet for Patients: SugarRoll.be Fact Sheet for Healthcare Providers: https://www.woods-mathews.com/ This test is not yet approved or cleared by the [place] FDA and  has been authorized for detection and/or diagnosis of SARS-CoV-2 by FDA under an Emergency Use Authorization (EUA). This EUA will remain  in effect (meaning this test can be used) for the duration of the COVID-19 declaration under Section 56 4(b)(1) of the Act, 21 U.S.C. section 360bbb-3(b)(1), unless the authorization is terminated or revoked sooner. Performed at [facility], [place] [address] [place], [place] [address]      Labs: BNP (last 3 results) Recent Labs    02/12/19 2356 05/16/19 1051 01/20/20 1504  BNP 158.1* 1,119.2* 440.3*   Basic Metabolic Panel: Recent Labs  Lab 01/22/20 0428 01/22/20 0428 01/22/20 1953 01/23/20 0454 01/24/20 0420 01/25/20 0439 01/26/20 0443  NA 138   < > 139 140 138 137 137  K 3.3*   < > 3.8 3.7 4.3 5.1 4.4  CL 103   < > 106 106 105 107 106  CO2 23   < > _0 GLUCOSE 118*   < > 131* 114* 146* 137* 91  BUN 46*   < > 43* 42* 43* 50* 46*  CREATININE 2.51*   < > 2.47* 2.55* 3.12* 2.90* 2.71*  CALCIUM 8.4*   < > 8.6* 8.6* 8.9 8.6* 8.5*  MG 2.0   < > 2.0 1.9 2.3 2.2 2.3  PHOS 2.7  --   --  3.5 4.3 4.4 3.3   < > = values in this interval not displayed.   Liver Function Tests: Recent Labs  Lab 01/20/20 1503 01/22/20 0428 01/22/20 1953 01/23/20 0454 01/24/20  1222 01/25/20 0439 01/26/20 0443  AST 45*  --  46*  --   --   --   --   ALT 12  --  14  --   --   --   --   ALKPHOS 418*  --  381*  --   --   --   --   BILITOT 1.9*  --  2.3*  --   --   --   --   PROT 7.1  --  6.7  --   --    --   --   ALBUMIN 3.0*   < > 2.7* 2.6* 2.8* 2.7* 2.6*   < > = values in this interval not displayed.   Recent Labs  Lab 01/22/20 1953  LIPASE 45   No results for input(s): AMMONIA in the last 168 hours. CBC: Recent Labs  Lab 01/20/20 1503 01/24/20 0420 01/25/20 0439 01/26/20 0443  WBC 1.8* 1.6* 2.5* 3.2*  NEUTROABS 1.0*  --   --   --   HGB 8.8* 9.2* 8.7* 8.4*  HCT 29.0* 30.6* 28.2* 27.7*  MCV 107.4* 106.3* 105.2* 106.1*  PLT 30* 23* 26* 26*   Cardiac Enzymes: No results for input(s): CKTOTAL, CKMB, CKMBINDEX, TROPONINI in the last 168 hours. BNP: Invalid input(s): POCBNP CBG: No results for input(s): GLUCAP in the last 168 hours. D-Dimer No results for input(s): DDIMER in the last 72 hours. Hgb A1c No results for input(s): HGBA1C in the last 72 hours. Lipid Profile No results for input(s): CHOL, HDL, LDLCALC, TRIG, CHOLHDL, LDLDIRECT in the last 72 hours. Thyroid function studies No results for input(s): TSH, T4TOTAL, T3FREE, THYROIDAB in the last 72 hours.  Invalid input(s): FREET3 Anemia work up No results for input(s): VITAMINB12, FOLATE, FERRITIN, TIBC, IRON, RETICCTPCT in the last 72 hours. Urinalysis    Component Value Date/Time   COLORURINE YELLOW 11/18/2017 0250   APPEARANCEUR CLEAR 11/18/2017 0250   LABSPEC 1.008 11/18/2017 0250   PHURINE 5.0 11/18/2017 0250   GLUCOSEU NEGATIVE 11/18/2017 0250   HGBUR NEGATIVE 11/18/2017 0250   BILIRUBINUR neg 12/18/2017 1352   KETONESUR NEGATIVE 11/18/2017 0250   PROTEINUR net 12/18/2017 1352   PROTEINUR NEGATIVE 11/18/2017 0250   UROBILINOGEN 0.2 12/18/2017 1352   UROBILINOGEN 0.2 10/18/2013 1247   NITRITE neg 12/18/2017 1352   NITRITE NEGATIVE 11/18/2017 0250   LEUKOCYTESUR Negative 12/18/2017 1352   Sepsis Labs Invalid input(s): PROCALCITONIN,  WBC,  LACTICIDVEN   Time coordinating discharge: 45 minutes  SIGNED:  Mercy Riding, MD  [facility] 01/26/2020, 2:54 PM  If 7PM-7AM, please contact  night-coverage [email or web address] Password [email or web address]

## 2020-01-26 NOTE — Progress Notes (Addendum)
Patient ID: RUGER SAXER, male   DOB: Jan 21, 1951, 69 y.o.   MRN: [id number]     Advanced Heart Failure Rounding Note  PCP-Cardiologist: Glori Bickers, MD   Subjective:    Feels better today. N/v resolved.   Still w/ mild HA. Head CT 3/23 negative. BP stable.   Volume status good. No respiratory difficulty. SCr improved.     Objective:   Weight Range: 90.2 kg Body mass index is 26.98 kg/m.   Vital Signs:   Temp:  [97.6 F (36.4 C)-98 F (36.7 C)] 98 F (36.7 C) (03/25 0404) Pulse Rate:  [72-80] 72 (03/25 0404) Resp:  [14-18] 18 (03/25 0404) BP: (115-152)/(70-92) 133/82 (03/25 0404) SpO2:  [98 %-100 %] 100 % (03/25 0835) Weight:  [90.2 kg] 90.2 kg (03/25 0404) Last BM Date: 01/24/20  Weight change: Filed Weights   01/24/20 0512 01/25/20 0415 01/26/20 0404  Weight: 90.4 kg 90.7 kg 90.2 kg    Intake/Output:   Intake/Output Summary (Last 24 hours) at 01/26/2020 1135 Last data filed at 01/26/2020 0900 Gross per 24 hour  Intake 720 ml  Output 925 ml  Net -205 ml      Physical Exam    PHYSICAL EXAM: General:  Well appearing male. No respiratory difficulty HEENT: normal anicteric  Neck: supple. elevated JVD to jaw line with prominent CV waves . Carotids 2+ bilat; no bruits. No lymphadenopathy or thyromegaly appreciated. Cor: PMI nondisplaced. Regular rate & rhythm. 2/6 HSM at apex. 2/6 TR  Loud P2.  Lungs: clear no wheezing Abdomen: soft, mild lower abdominal tenderness, nondistended. No hepatosplenomegaly. No bruits or masses. Good bowel sounds.  Extremities: no cyanosis, clubbing, rash, no LEE Neuro: alert & oriented x 3, cranial nerves grossly intact. moves all 4 extremities w/o difficulty. Affect pleasant   Telemetry   NSR 70s, Personally reviewed  Labs    CBC Recent Labs    01/25/20 0439 01/26/20 0443  WBC 2.5* 3.2*  HGB 8.7* 8.4*  HCT 28.2* 27.7*  MCV 105.2* 106.1*  PLT 26* 26*   Basic Metabolic Panel Recent Labs    01/25/20 0439  01/26/20 0443  NA 137 137  K 5.1 4.4  CL 107 106  CO2 22 23  GLUCOSE 137* 91  BUN 50* 46*  CREATININE 2.90* 2.71*  CALCIUM 8.6* 8.5*  MG 2.2 2.3  PHOS 4.4 3.3   Liver Function Tests Recent Labs    01/25/20 0439 01/26/20 0443  ALBUMIN 2.7* 2.6*   No results for input(s): LIPASE, AMYLASE in the last 72 hours. Cardiac Enzymes No results for input(s): CKTOTAL, CKMB, CKMBINDEX, TROPONINI in the last 72 hours.  BNP: BNP (last 3 results) Recent Labs    02/12/19 2356 05/16/19 1051 01/20/20 1504  BNP 158.1* 1,119.2* 506.0*    ProBNP (last 3 results) Recent Labs    10/11/19 1138 12/08/19 1041 01/11/20 1439  PROBNP 604.0* 710.0* 494.0*     D-Dimer No results for input(s): DDIMER in the last 72 hours. Hemoglobin A1C No results for input(s): HGBA1C in the last 72 hours. Fasting Lipid Panel No results for input(s): CHOL, HDL, LDLCALC, TRIG, CHOLHDL, LDLDIRECT in the last 72 hours. Thyroid Function Tests No results for input(s): TSH, T4TOTAL, T3FREE, THYROIDAB in the last 72 hours.  Invalid input(s): FREET3  Other results:   Imaging    No results found.   Medications:     Scheduled Medications: . allopurinol  200 mg Oral Daily  . Ferrous Fumarate  1 tablet Oral Daily  .  folic acid  1 mg Oral Daily  . macitentan  10 mg Oral Daily  . mometasone-formoterol  2 puff Inhalation BID  . multivitamin with minerals  1 tablet Oral Daily  . pantoprazole  40 mg Oral Daily  . rosuvastatin  5 mg Oral Daily  . Selexipag  1,000 mcg Oral BID  . sodium chloride flush  3 mL Intravenous Q12H  . tadalafil  40 mg Oral Daily  . thiamine  100 mg Oral Daily    Infusions: . sodium chloride      PRN Medications: sodium chloride, albuterol, butalbital-acetaminophen-caffeine, ondansetron (ZOFRAN) IV, oxyCODONE, prochlorperazine, sodium chloride flush   Assessment/Plan   1. RV failure: Patient has RV failure due to portopulmonary hypertension.  Echo this admission with  LV EF 60-65%, moderate LVH, severely dilated and severely dysfunctional RV, PASP 83 mmHg.  He has cardiorenal syndrome, recently torsemide was cut back with rise in creatinine to > 3.  Creatinine initially improved this admit w/ IV diuresis but significant in SCr to 3.12 2/2 over diuresis and diuretics held. Cr has returned back to baseline. Volume status improved.  Think this is about as good as we can get him from hemodynamic standpoint - Restart PO diuretics. Resume prior home dose, torsemide 60 mg bid.  2. Pulmonary hypertension: Thought to have portopulmonary hypertension with RV failure.   - Continue home PH meds (Selexipag, tadalafil, and Opsumit).  3. AKI on CKD stage.  SCr peaked to 3.3 this admit. Improved w/ initially w/ IV Lasix but bump from 2.5>>3.12 2/2 over diureses.  - Diuretics held and gentle IVF hydration given - SCr improved, 3.12>>2.90>>2.71.  - Resume PO torsemide today 60 mg bid. We will check BMP at post hospital f/u in 7 days.  4. Cirrhosis: From ETOH. RUQ [place] 3/22 showed new unchanged cirrhosis and new small ascites. 5. Pancytopenia: This is known from the past.  Thought to be related to cirrhosis/splenic sequestration.  - Hgb stable at 8.4. No bleeding. Fe this admit 54. Sats ratio 16%. Recieved feraheme on 3/22. 6. Headache: - continues w/ HA. Head CT 3/23 negative.   Stable for d/c home from CHF standpoint. We will arrange post hospital f/u and will check BMP. Appt info in AVS  Cardiac Meds for Discharge Opsumit 10 mg daily  Uptravi 1000 mg bid Tadalafil 40 mg daily  Torsemide 60 mg bid  Crestor 5 mg qhs   Length of Stay: [address], PA-C  01/26/2020, 11:35 AM  Advanced Heart Failure Team Pager [phone] (M-F; 7a - 4p)  Please contact [facility] for night-coverage after hours (4p -7a ) and weekends on [email or web address]   Patient seen and examined with the above-signed Advanced Practice Provider and/or Housestaff. I personally reviewed laboratory  data, imaging studies and relevant notes. I independently examined the patient and formulated the important aspects of the plan. I have edited the note to reflect any of my changes or salient points. I have personally discussed the plan with the patient and/or family.  He looks better today. Volume status stable. Continue with HA but improved. CT brain unremarkable. Renal function better.   OK for d/c today from HF perspective.  Would hold torsemide for one day an restart diuretics on Saturday.   We will follow in HF Clinic.   Glori Bickers, MD  12:16 PM

## 2020-01-27 ENCOUNTER — Telehealth: Payer: Self-pay | Admitting: *Deleted

## 2020-01-27 NOTE — Telephone Encounter (Signed)
1st attempt. Unable to reach patient.   

## 2020-01-30 ENCOUNTER — Encounter (HOSPITAL_COMMUNITY): Payer: Self-pay | Admitting: Emergency Medicine

## 2020-01-30 ENCOUNTER — Other Ambulatory Visit: Payer: Self-pay

## 2020-01-30 ENCOUNTER — Telehealth: Payer: Self-pay | Admitting: Family Medicine

## 2020-01-30 ENCOUNTER — Inpatient Hospital Stay (HOSPITAL_COMMUNITY)
Admission: EM | Admit: 2020-01-30 | Discharge: 2020-02-05 | DRG: 871 | Disposition: A | Discharge status: Home Health | Payer: Medicare Other | Attending: Internal Medicine | Admitting: Internal Medicine

## 2020-01-30 ENCOUNTER — Emergency Department (HOSPITAL_COMMUNITY): Payer: Medicare Other

## 2020-01-30 DIAGNOSIS — D731 Hypersplenism: Secondary | ICD-10-CM | POA: Diagnosis not present

## 2020-01-30 DIAGNOSIS — A419 Sepsis, unspecified organism: Secondary | ICD-10-CM | POA: Diagnosis not present

## 2020-01-30 DIAGNOSIS — I5033 Acute on chronic diastolic (congestive) heart failure: Secondary | ICD-10-CM | POA: Diagnosis present

## 2020-01-30 DIAGNOSIS — D61818 Other pancytopenia: Secondary | ICD-10-CM | POA: Diagnosis present

## 2020-01-30 DIAGNOSIS — I50812 Chronic right heart failure: Secondary | ICD-10-CM | POA: Diagnosis not present

## 2020-01-30 DIAGNOSIS — I2721 Secondary pulmonary arterial hypertension: Secondary | ICD-10-CM | POA: Diagnosis not present

## 2020-01-30 DIAGNOSIS — I10 Essential (primary) hypertension: Secondary | ICD-10-CM | POA: Diagnosis present

## 2020-01-30 DIAGNOSIS — R0600 Dyspnea, unspecified: Secondary | ICD-10-CM

## 2020-01-30 DIAGNOSIS — N179 Acute kidney failure, unspecified: Secondary | ICD-10-CM | POA: Diagnosis present

## 2020-01-30 DIAGNOSIS — K7030 Alcoholic cirrhosis of liver without ascites: Secondary | ICD-10-CM | POA: Diagnosis present

## 2020-01-30 DIAGNOSIS — H9190 Unspecified hearing loss, unspecified ear: Secondary | ICD-10-CM | POA: Diagnosis present

## 2020-01-30 DIAGNOSIS — I248 Other forms of acute ischemic heart disease: Secondary | ICD-10-CM | POA: Diagnosis present

## 2020-01-30 DIAGNOSIS — I8510 Secondary esophageal varices without bleeding: Secondary | ICD-10-CM | POA: Diagnosis present

## 2020-01-30 DIAGNOSIS — E039 Hypothyroidism, unspecified: Secondary | ICD-10-CM | POA: Diagnosis present

## 2020-01-30 DIAGNOSIS — E781 Pure hyperglyceridemia: Secondary | ICD-10-CM | POA: Diagnosis present

## 2020-01-30 DIAGNOSIS — J440 Chronic obstructive pulmonary disease with acute lower respiratory infection: Secondary | ICD-10-CM | POA: Diagnosis present

## 2020-01-30 DIAGNOSIS — R0602 Shortness of breath: Secondary | ICD-10-CM | POA: Diagnosis not present

## 2020-01-30 DIAGNOSIS — I130 Hypertensive heart and chronic kidney disease with heart failure and stage 1 through stage 4 chronic kidney disease, or unspecified chronic kidney disease: Secondary | ICD-10-CM | POA: Diagnosis present

## 2020-01-30 DIAGNOSIS — F1020 Alcohol dependence, uncomplicated: Secondary | ICD-10-CM | POA: Diagnosis present

## 2020-01-30 DIAGNOSIS — N183 Chronic kidney disease, stage 3 unspecified: Secondary | ICD-10-CM | POA: Diagnosis not present

## 2020-01-30 DIAGNOSIS — E785 Hyperlipidemia, unspecified: Secondary | ICD-10-CM | POA: Diagnosis not present

## 2020-01-30 DIAGNOSIS — Z79899 Other long term (current) drug therapy: Secondary | ICD-10-CM

## 2020-01-30 DIAGNOSIS — J189 Pneumonia, unspecified organism: Secondary | ICD-10-CM | POA: Diagnosis not present

## 2020-01-30 DIAGNOSIS — G4733 Obstructive sleep apnea (adult) (pediatric): Secondary | ICD-10-CM | POA: Diagnosis present

## 2020-01-30 DIAGNOSIS — L03115 Cellulitis of right lower limb: Secondary | ICD-10-CM | POA: Diagnosis not present

## 2020-01-30 DIAGNOSIS — I5043 Acute on chronic combined systolic (congestive) and diastolic (congestive) heart failure: Secondary | ICD-10-CM

## 2020-01-30 DIAGNOSIS — M109 Gout, unspecified: Secondary | ICD-10-CM | POA: Diagnosis present

## 2020-01-30 DIAGNOSIS — Y95 Nosocomial condition: Secondary | ICD-10-CM | POA: Diagnosis present

## 2020-01-30 DIAGNOSIS — R55 Syncope and collapse: Secondary | ICD-10-CM | POA: Diagnosis not present

## 2020-01-30 DIAGNOSIS — J449 Chronic obstructive pulmonary disease, unspecified: Secondary | ICD-10-CM | POA: Diagnosis not present

## 2020-01-30 DIAGNOSIS — F1029 Alcohol dependence with unspecified alcohol-induced disorder: Secondary | ICD-10-CM | POA: Diagnosis not present

## 2020-01-30 DIAGNOSIS — I2510 Atherosclerotic heart disease of native coronary artery without angina pectoris: Secondary | ICD-10-CM | POA: Diagnosis present

## 2020-01-30 DIAGNOSIS — R609 Edema, unspecified: Secondary | ICD-10-CM | POA: Diagnosis not present

## 2020-01-30 DIAGNOSIS — N184 Chronic kidney disease, stage 4 (severe): Secondary | ICD-10-CM | POA: Diagnosis present

## 2020-01-30 DIAGNOSIS — I272 Pulmonary hypertension, unspecified: Secondary | ICD-10-CM | POA: Diagnosis present

## 2020-01-30 DIAGNOSIS — I509 Heart failure, unspecified: Secondary | ICD-10-CM

## 2020-01-30 DIAGNOSIS — K766 Portal hypertension: Secondary | ICD-10-CM | POA: Diagnosis not present

## 2020-01-30 DIAGNOSIS — Z20822 Contact with and (suspected) exposure to covid-19: Secondary | ICD-10-CM | POA: Diagnosis present

## 2020-01-30 DIAGNOSIS — Z886 Allergy status to analgesic agent status: Secondary | ICD-10-CM

## 2020-01-30 DIAGNOSIS — R7989 Other specified abnormal findings of blood chemistry: Secondary | ICD-10-CM | POA: Diagnosis not present

## 2020-01-30 DIAGNOSIS — Z87891 Personal history of nicotine dependence: Secondary | ICD-10-CM

## 2020-01-30 DIAGNOSIS — R6521 Severe sepsis with septic shock: Secondary | ICD-10-CM

## 2020-01-30 DIAGNOSIS — Z8249 Family history of ischemic heart disease and other diseases of the circulatory system: Secondary | ICD-10-CM

## 2020-01-30 DIAGNOSIS — E876 Hypokalemia: Secondary | ICD-10-CM | POA: Diagnosis not present

## 2020-01-30 DIAGNOSIS — R778 Other specified abnormalities of plasma proteins: Secondary | ICD-10-CM

## 2020-01-30 DIAGNOSIS — L02419 Cutaneous abscess of limb, unspecified: Secondary | ICD-10-CM | POA: Diagnosis not present

## 2020-01-30 DIAGNOSIS — F1021 Alcohol dependence, in remission: Secondary | ICD-10-CM | POA: Diagnosis present

## 2020-01-30 DIAGNOSIS — Z888 Allergy status to other drugs, medicaments and biological substances status: Secondary | ICD-10-CM

## 2020-01-30 DIAGNOSIS — L03119 Cellulitis of unspecified part of limb: Secondary | ICD-10-CM | POA: Diagnosis not present

## 2020-01-30 DIAGNOSIS — G40909 Epilepsy, unspecified, not intractable, without status epilepticus: Secondary | ICD-10-CM | POA: Diagnosis present

## 2020-01-30 DIAGNOSIS — I50813 Acute on chronic right heart failure: Secondary | ICD-10-CM | POA: Diagnosis not present

## 2020-01-30 DIAGNOSIS — Z841 Family history of disorders of kidney and ureter: Secondary | ICD-10-CM

## 2020-01-30 DIAGNOSIS — K219 Gastro-esophageal reflux disease without esophagitis: Secondary | ICD-10-CM | POA: Diagnosis present

## 2020-01-30 LAB — CBC WITH DIFFERENTIAL/PLATELET
Abs Immature Granulocytes: 0.86 10*3/uL — ABNORMAL HIGH (ref 0.00–0.07)
Basophils Absolute: 0 10*3/uL (ref 0.0–0.1)
Basophils Relative: 0 %
Eosinophils Absolute: 0 10*3/uL (ref 0.0–0.5)
Eosinophils Relative: 0 %
HCT: 38.3 % — ABNORMAL LOW (ref 39.0–52.0)
Hemoglobin: 11.6 g/dL — ABNORMAL LOW (ref 13.0–17.0)
Immature Granulocytes: 5 %
Lymphocytes Relative: 5 %
Lymphs Abs: 0.8 10*3/uL (ref 0.7–4.0)
MCH: 32.7 pg (ref 26.0–34.0)
MCHC: 30.3 g/dL (ref 30.0–36.0)
MCV: 107.9 fL — ABNORMAL HIGH (ref 80.0–100.0)
Monocytes Absolute: 1.1 10*3/uL — ABNORMAL HIGH (ref 0.1–1.0)
Monocytes Relative: 6 %
Neutro Abs: 15.1 10*3/uL — ABNORMAL HIGH (ref 1.7–7.7)
Neutrophils Relative %: 84 %
Platelets: 32 10*3/uL — ABNORMAL LOW (ref 150–400)
RBC: 3.55 MIL/uL — ABNORMAL LOW (ref 4.22–5.81)
RDW: 20.4 % — ABNORMAL HIGH (ref 11.5–15.5)
WBC: 17.9 10*3/uL — ABNORMAL HIGH (ref 4.0–10.5)
nRBC: 0.1 % (ref 0.0–0.2)

## 2020-01-30 LAB — COMPREHENSIVE METABOLIC PANEL
ALT: 25 U/L (ref 0–44)
AST: 57 U/L — ABNORMAL HIGH (ref 15–41)
Albumin: 3.1 g/dL — ABNORMAL LOW (ref 3.5–5.0)
Alkaline Phosphatase: 293 U/L — ABNORMAL HIGH (ref 38–126)
Anion gap: 16 — ABNORMAL HIGH (ref 5–15)
BUN: 57 mg/dL — ABNORMAL HIGH (ref 8–23)
CO2: 13 mmol/L — ABNORMAL LOW (ref 22–32)
Calcium: 8.5 mg/dL — ABNORMAL LOW (ref 8.9–10.3)
Chloride: 105 mmol/L (ref 98–111)
Creatinine, Ser: 4.54 mg/dL — ABNORMAL HIGH (ref 0.61–1.24)
GFR calc Af Amer: 14 mL/min — ABNORMAL LOW (ref >60)
GFR calc non Af Amer: 12 mL/min — ABNORMAL LOW (ref >60)
Glucose, Bld: 116 mg/dL — ABNORMAL HIGH (ref 70–99)
Potassium: 4.8 mmol/L (ref 3.5–5.1)
Sodium: 134 mmol/L — ABNORMAL LOW (ref 135–145)
Total Bilirubin: 4.4 mg/dL — ABNORMAL HIGH (ref 0.3–1.2)
Total Protein: 7.7 g/dL (ref 6.5–8.1)

## 2020-01-30 LAB — TROPONIN I (HIGH SENSITIVITY)
Troponin I (High Sensitivity): 377 ng/L (ref <18)
Troponin I (High Sensitivity): 414 ng/L (ref <18)

## 2020-01-30 LAB — BRAIN NATRIURETIC PEPTIDE: B Natriuretic Peptide: >4500 pg/mL — ABNORMAL HIGH (ref 0.0–100.0)

## 2020-01-30 MED ORDER — SELEXIPAG 1000 MCG PO TABS
1000.0000 ug | ORAL_TABLET | Freq: Two times a day (BID) | ORAL | Status: DC
  Administered 2020-01-31 – 2020-02-05 (×10): 1000 ug via ORAL
  Filled 2020-01-30 (×10): qty 1

## 2020-01-30 MED ORDER — ONDANSETRON HCL 4 MG PO TABS: 4.0000 mg | ORAL_TABLET | Freq: Four times a day (QID) | ORAL | Status: DC | PRN

## 2020-01-30 MED ORDER — SODIUM CHLORIDE 0.9 % IV SOLN
250.0000 mL | INTRAVENOUS | Status: DC | PRN
  Administered 2020-01-31: 250 mL via INTRAVENOUS

## 2020-01-30 MED ORDER — MACITENTAN 10 MG PO TABS
10.0000 mg | ORAL_TABLET | Freq: Every day | ORAL | Status: DC
  Administered 2020-01-31 – 2020-02-05 (×6): 10 mg via ORAL
  Filled 2020-01-30 (×6): qty 1

## 2020-01-30 MED ORDER — FOLIC ACID 1 MG PO TABS
1.0000 mg | ORAL_TABLET | Freq: Every day | ORAL | Status: DC
  Administered 2020-01-31 – 2020-02-05 (×6): 1 mg via ORAL
  Filled 2020-01-30 (×6): qty 1

## 2020-01-30 MED ORDER — ONDANSETRON HCL 4 MG/2ML IJ SOLN: 4.0000 mg | Freq: Four times a day (QID) | INTRAMUSCULAR | Status: DC | PRN

## 2020-01-30 MED ORDER — THIAMINE HCL 100 MG PO TABS
100.0000 mg | ORAL_TABLET | Freq: Every day | ORAL | Status: DC
  Administered 2020-01-31 – 2020-02-05 (×6): 100 mg via ORAL
  Filled 2020-01-30 (×6): qty 1

## 2020-01-30 MED ORDER — VANCOMYCIN HCL 2000 MG/400ML IV SOLN
2000.0000 mg | Freq: Once | INTRAVENOUS | Status: AC
  Administered 2020-01-30: 2000 mg via INTRAVENOUS
  Filled 2020-01-30: qty 400

## 2020-01-30 MED ORDER — ROSUVASTATIN CALCIUM 5 MG PO TABS
5.0000 mg | ORAL_TABLET | Freq: Every day | ORAL | Status: DC
  Administered 2020-01-31 – 2020-02-04 (×5): 5 mg via ORAL
  Filled 2020-01-30 (×5): qty 1

## 2020-01-30 MED ORDER — PANTOPRAZOLE SODIUM 40 MG PO TBEC
40.0000 mg | DELAYED_RELEASE_TABLET | Freq: Every day | ORAL | Status: DC
  Administered 2020-01-31 – 2020-02-05 (×6): 40 mg via ORAL
  Filled 2020-01-30 (×6): qty 1

## 2020-01-30 MED ORDER — SODIUM CHLORIDE 0.9% FLUSH
3.0000 mL | Freq: Two times a day (BID) | INTRAVENOUS | Status: DC
  Administered 2020-01-31 – 2020-02-05 (×8): 3 mL via INTRAVENOUS

## 2020-01-30 MED ORDER — VANCOMYCIN VARIABLE DOSE PER UNSTABLE RENAL FUNCTION (PHARMACIST DOSING): Status: DC

## 2020-01-30 MED ORDER — SODIUM CHLORIDE 0.9 % IV SOLN
2.0000 g | INTRAVENOUS | Status: DC
  Administered 2020-01-30 – 2020-02-01 (×3): 2 g via INTRAVENOUS
  Filled 2020-01-30 (×5): qty 2

## 2020-01-30 MED ORDER — SODIUM CHLORIDE 0.9% FLUSH: 3.0000 mL | INTRAVENOUS | Status: DC | PRN

## 2020-01-30 MED ORDER — TADALAFIL 20 MG PO TABS
40.0000 mg | ORAL_TABLET | Freq: Every day | ORAL | Status: DC
  Administered 2020-01-31 – 2020-02-05 (×6): 40 mg via ORAL
  Filled 2020-01-30 (×7): qty 2

## 2020-01-30 MED ORDER — HEPARIN SODIUM (PORCINE) 5000 UNIT/ML IJ SOLN
5000.0000 [IU] | Freq: Three times a day (TID) | INTRAMUSCULAR | Status: DC
  Administered 2020-01-31 (×2): 5000 [IU] via SUBCUTANEOUS
  Filled 2020-01-30 (×2): qty 1

## 2020-01-30 MED ORDER — FERROUS FUMARATE 324 (106 FE) MG PO TABS
1.0000 | ORAL_TABLET | Freq: Every day | ORAL | Status: DC
  Administered 2020-01-31 – 2020-02-05 (×6): 106 mg via ORAL
  Filled 2020-01-30 (×6): qty 1

## 2020-01-30 NOTE — ED Provider Notes (Signed)
MSE was initiated and I personally evaluated the patient and placed orders (if any) at  4:07 PM on March 45, 4717.  69 year old male with PMHx COPD and CHF who persents to the ED today with complaint of SOB that began yesterday, gradually worsening. Pt mentioned to triage nurse that he was recently taken off of his Lasix. Unable to see this note. Was recently admitted to the hospital from 3/19-3/25 for CHF exacerbation after complaining of SOB and 6 pound weight gain. Per discharge summary pt was discharged home with Torsemide 60 mg BID starting on 3/27. Pt denies chest pain, fevers, chills, or any other associated symptoms.   Physical Exam  Constitutional: He is well-developed, well-nourished, and in no distress.  HENT:  Head: Normocephalic and atraumatic.  Neck: No JVD present.  Cardiovascular: Regular rhythm.  Tachycardic  Pulmonary/Chest: He has no wheezes. He has rales.  Tachypneic  Abdominal: Soft. Bowel sounds are normal. There is no abdominal tenderness. There is no rebound and no guarding.  Musculoskeletal:        General: Edema present.     Comments: 2+ pitting edema bilaterally   69 year old male with PMHx CHF who presents with SOB x 1 day. On arrival pt is tachycardic and tachypneic. He has rales diffusely and 2+ pitting edema. Denies weight gain however recently discharged from hospital for CHF exacerbation. Question if pt is compliant on his torsemide. Will workup for CHF exacerbation.   The patient appears stable so that the remainder of the MSE may be completed by another provider.   Eustaquio Maize, PA-C 01/30/20 1630    Lucrezia Starch, MD 01/31/20 [phone]

## 2020-01-30 NOTE — Telephone Encounter (Signed)
Patient's son states that he is shortness of breath, patient also has swelling and very fatigue. Please advise

## 2020-01-30 NOTE — ED Provider Notes (Addendum)
[facility] EMERGENCY DEPARTMENT Provider Note   CSN: [id number] Arrival date & time: 01/30/20  1558     History Chief Complaint  Patient presents with  . Shortness of Breath  . Leg Swelling    Darren Mueller is a 69 y.o. male.  Level 5 caveat for acuity of condition. Chief complaint dyspnea the past 24 hours. Patient was just discharged from the hospital 3 days ago for a CHF and COPD exacerbation. No substernal chest pain. No productive cough. Today he was unable to walk even a few steps. Nursing notes state he was recently taken off of his Lasix.          Past Medical History:  Diagnosis Date  . Alcohol dependence ([place]) 03/22/2011   In remission   . Alcohol dependence in remission ([place]) 03/22/2011   In remission   . Alcoholic cirrhosis of liver with ascites ([place]) 2014  . Anginal pain ([place])    with SOB admitted 04/20/2019  . Cardiomegaly 02/2019   Noted on CXR  . CHF (congestive heart failure) ([place])   . CKD (chronic kidney disease), stage III 07/18/2013  . COPD (chronic obstructive pulmonary disease) ([place])   . Coronary artery disease   . Dermatitis 06/10/2015  . Diarrhea 09/04/2013  . Dyspnea    admitted 04/20/2019  . Elbow pain, left 03/17/2017  . Eustachian tube dysfunction   . GERD (gastroesophageal reflux disease)   . Gout   . Headache   . Hearing loss   . Heart murmur   . Hx of colonic polyps   . Hypertension   . Hypertriglyceridemia 03/17/2017  . Hypopotassemia   . Otalgia 11/28/2013  . Other chronic pulmonary heart diseases   . PAH (pulmonary artery hypertension) (HCC)    RV failure  . Pancytopenia ([place])   . Pleural effusion 2014   Noted on CT:   Moderately large simple layering right pleural effusion   . Preventative health care 06/29/2016  . Pulmonary nodule 2014   Noted on CT: An 8 mm pulmonary nodule in the periphery of the left lower lobe  . Rectal bleeding   . Red eye 03/03/2016   right  . Seizures ([place])   . Sleep apnea    does  not wear CPAP  . Splenomegaly 12/2015   Noted on CT  . Thoracic aorta atherosclerosis ([place]) 12/2017  . Thrombocytopenia ([place]) 06/29/2016  . Unspecified hypothyroidism 01/25/2013  . Unspecified pleural effusion   . Vertigo   . Wears glasses     Patient Active Problem List   Diagnosis Date Noted  . CHF (congestive heart failure) ([place]) 01/20/2020  . Abdominal pain 08/23/2019  . Grief 08/23/2019  . Acute on chronic diastolic heart failure ([place]) 05/16/2019  . Acute on chronic diastolic (congestive) heart failure ([place]) 05/16/2019  . Calf pain 04/03/2019  . History of colonic polyps   . Polyp of cecum   . Headache 02/21/2019  . Rectal bleeding 02/13/2019  . COPD (chronic obstructive pulmonary disease) ([place]) 02/13/2019  . Acute on chronic right heart failure ([place]) 02/13/2019  . Hematochezia   . Ascites   . Dark stools   . Alcoholism ([place])   . Hyperglycemia 05/04/2018  . Seizure disorder ([place]) 02/01/2018  . Pedal edema 10/01/2017  . RUQ pain 05/06/2017  . Decreased range of motion of left elbow 04/15/2017  . Hyperlipidemia 03/17/2017  . Elbow pain, left 03/17/2017  . Right rib fracture   . Syncope 07/19/2016  . Thrombocytopenia ([place])  06/29/2016  . Hoarseness, chronic 06/29/2016  . Preventative health care 06/29/2016  . Chest pain   . Low back pain 12/09/2015  . Cirrhosis with alcoholism ([place]) 08/08/2015  . Pancytopenia ([place]) 06/10/2015  . Dermatitis 06/10/2015  . OSA (obstructive sleep apnea) 02/13/2015  . Gout 07/30/2014  . Diarrhea 09/04/2013  . Chronic renal insufficiency, stage III (moderate) ([place]) 07/18/2013  . Right heart failure due to pulmonary hypertension ([place]) 06/06/2013  . Hypothyroidism 01/25/2013  . PAH (pulmonary arterial hypertension) with portal hypertension ([place]) 01/21/2013  . Allergic rhinitis 12/21/2012  . Secondary pulmonary hypertension 12/09/2012  . Hepatic cirrhosis ([place]) 11/26/2012  . GERD (gastroesophageal reflux disease) 09/05/2011  .  Hypokalemia 08/29/2011  . Colon polyps 03/26/2011  . Insomnia 03/26/2011  . Hypertension 03/22/2011  . Alcohol dependence ([place]) 03/22/2011    Past Surgical History:  Procedure Laterality Date  . CARDIAC CATHETERIZATION N/A 12/21/2015   Procedure: Right Heart Cath;  Surgeon: Jolaine Artist, MD;  Location: [facility];  Service: Cardiovascular;  Laterality: N/A;  . CARDIAC CATHETERIZATION N/A 04/15/2016   Procedure: Right/Left Heart Cath and Coronary Angiography;  Surgeon: Jolaine Artist, MD;  Location: [facility];  Service: Cardiovascular;  Laterality: N/A;  . COLONOSCOPY N/A 12/08/2013   Procedure: COLONOSCOPY;  Surgeon: Milus Banister, MD;  Location: [facility];  Service: Endoscopy;  Laterality: N/A;  . COLONOSCOPY WITH PROPOFOL N/A 03/24/2019   Procedure: COLONOSCOPY WITH PROPOFOL;  Surgeon: Milus Banister, MD;  Location: [facility];  Service: Endoscopy;  Laterality: N/A;  Draw CBC in preop  . COLONOSCOPY WITH PROPOFOL N/A 04/28/2019   Procedure: COLONOSCOPY WITH PROPOFOL;  Surgeon: Milus Banister, MD;  Location: [facility];  Service: Endoscopy;  Laterality: N/A;  . ESOPHAGOGASTRODUODENOSCOPY (EGD) WITH PROPOFOL N/A 03/27/2016   Procedure: ESOPHAGOGASTRODUODENOSCOPY (EGD) WITH PROPOFOL;  Surgeon: Milus Banister, MD;  Location: [facility];  Service: Endoscopy;  Laterality: N/A;  . ESOPHAGOGASTRODUODENOSCOPY (EGD) WITH PROPOFOL N/A 02/14/2019   Procedure: ESOPHAGOGASTRODUODENOSCOPY (EGD) WITH PROPOFOL;  Surgeon: Jerene Bears, MD;  Location[facility];  Service: Gastroenterology;  Laterality: N/A;  . EYE SURGERY Bilateral 03/15/2019   lasix  . GAS/FLUID EXCHANGE Right 09/22/2019   Procedure: GAS/FLUID EXCHANGE (C3F8) RIGHT EYE;  Surgeon: Sherlynn Stalls, MD;  Location: [facility];  Service: Ophthalmology;  Laterality: Right;  . HEMOSTASIS CLIP PLACEMENT  04/28/2019   Procedure: HEMOSTASIS CLIP PLACEMENT;  Surgeon: Milus Banister, MD;  Location: [facility];   Service: Endoscopy;;  . LOOP RECORDER INSERTION N/A 01/01/2017   Procedure: Loop Recorder Insertion;  Surgeon: Thompson Grayer, MD;  Location: [facility];  Service: Cardiovascular;  Laterality: N/A;  . PARS PLANA VITRECTOMY Right 09/22/2019   Procedure: PARS PLANA VITRECTOMY WITH 25 GAUGE RIGHT EYE;  Surgeon: Sherlynn Stalls, MD;  Location: [facility];  Service: Ophthalmology;  Laterality: Right;  . PHOTOCOAGULATION WITH LASER Right 09/22/2019   Procedure: PHOTOCOAGULATION WITH LASER RIGHT EYE;  Surgeon: Sherlynn Stalls, MD;  Location: [facility];  Service: Ophthalmology;  Laterality: Right;  . POLYPECTOMY  04/28/2019   Procedure: POLYPECTOMY;  Surgeon: Milus Banister, MD;  Location: [facility];  Service: Endoscopy;;  . RIGHT HEART CATHETERIZATION Right 06/06/2014   Procedure: RIGHT HEART CATH;  Surgeon: Jolaine Artist, MD;  Location: [facility] CATH LAB;  Service: Cardiovascular;  Laterality: Right;  . UVULOPALATOPHARYNGOPLASTY  1999       Family History  Problem Relation Age of Onset  . Heart disease Mother   . Heart attack  Mother   . Hypertension Mother   . Kidney failure Mother   . Liver disease Mother        stage 4 liver disease  . Prostate cancer Father   . Cancer Father        lung  . Hypertension Brother   . Gout Brother   . Alcoholism Maternal Grandfather   . Liver disease Maternal Grandfather   . Migraines Sister   . Hypertension Brother   . Colon cancer Neg Hx     Social History   Tobacco Use  . Smoking status: Former Smoker    Packs/day: 2.00    Years: 5.00    Pack years: 10.00    Types: Cigarettes    Quit date: 09/22/1979    Years since quitting: 40.3  . Smokeless tobacco: Never Used  Substance Use Topics  . Alcohol use: Yes    Alcohol/week: 6.0 standard drinks    Types: 6 Shots of liquor per week    Comment: social drinker  . Drug use: No    Home Medications Prior to Admission medications   Medication Sig Start Date End Date Taking? Authorizing Provider    acetaminophen (TYLENOL) 500 MG tablet Take 1,000 mg by mouth every 6 (six) hours as needed (for pain.).   Yes [provider]  allopurinol (ZYLOPRIM) 100 MG tablet Take 2 tablets (200 mg total) by mouth daily. 07/12/19  Yes Mosie Lukes, MD  diphenoxylate-atropine (LOMOTIL) 2.5-0.025 MG tablet Take 1 tablet by mouth 4 (four) times daily as needed for diarrhea or loose stools. 12/01/19  Yes Mosie Lukes, MD  Ferrous Fumarate (HEMOCYTE) 324 (106 Fe) MG TABS tablet Take 1 tablet (106 mg of iron total) by mouth daily. 05/05/19  Yes Mosie Lukes, MD  folic acid (FOLVITE) 1 MG tablet Take 1 tablet (1 mg total) by mouth daily. 01/11/20  Yes Saguier, Percell Miller, PA-C  hyoscyamine (LEVSIN SL) 0.125 MG SL tablet Place 1 tablet (0.125 mg total) under the tongue every 6 (six) hours as needed. Patient taking differently: Place 0.125 mg under the tongue every 6 (six) hours as needed for cramping.  08/23/19  Yes Mosie Lukes, MD  macitentan (OPSUMIT) 10 MG tablet Take 1 tablet (10 mg total) by mouth daily. 09/26/19  Yes Bensimhon, Shaune Pascal, MD  Multiple Vitamin (MULTIVITAMIN) tablet Take 1 tablet by mouth daily.   Yes [provider]  omeprazole (PRILOSEC) 40 MG capsule TAKE 1 CAPSULE BY MOUTH ONCE DAILY Patient taking differently: Take 40 mg by mouth daily.  11/14/19  Yes Saguier, Percell Miller, PA-C  Potassium Chloride ER 20 MEQ TBCR Take 40 mEq by mouth daily. Patient taking differently: Take 10 mEq by mouth in the morning, at noon, and at bedtime.  01/28/20  Yes Mercy Riding, MD  rosuvastatin (CRESTOR) 5 MG tablet TAKE 1 TABLET DAILY Patient taking differently: Take 5 mg by mouth daily at 6 PM.  08/31/19  Yes Mosie Lukes, MD  Selexipag (UPTRAVI) 1000 MCG TABS Take 1,000 mcg by mouth 2 (two) times daily. 06/03/19  Yes Bensimhon, Shaune Pascal, MD  SUMAtriptan (IMITREX) 50 MG tablet Take 1 tablet (50 mg total) by mouth daily as needed for migraine or headache. 01/26/20 02/25/20 Yes Mercy Riding, MD   tadalafil, PAH, (ADCIRCA) 20 MG tablet Take 2 tablets (40 mg total) by mouth daily. 11/18/19  Yes Bensimhon, Shaune Pascal, MD  thiamine 100 MG tablet Take 1 tablet (100 mg total) by mouth daily. 05/05/19  Yes Mosie Lukes, MD  torsemide (DEMADEX) 20 MG tablet Take 3 tablets (60 mg total) by mouth 2 (two) times daily. 01/28/20  Yes Mercy Riding, MD  ADVAIR HFA [phone] MCG/ACT inhaler USE 2 INHALATIONS EVERY MORNING Patient not taking: No sig reported 11/04/19   Mosie Lukes, MD  Blood Pressure Monitoring (BLOOD PRESSURE CUFF) MISC Use as directed. Check blood pressure bid prn Dx:I10 02/22/19   Mosie Lukes, MD  traZODone (DESYREL) 50 MG tablet Take 0.5-1 tablets (25-50 mg total) by mouth at bedtime as needed for sleep. Patient not taking: Reported on 01/30/2020 01/11/20   Saguier, Percell Miller, PA-C    Allergies    Aspirin and Nitroglycerin  Review of Systems   Review of Systems  Unable to perform ROS: Acuity of condition    Physical Exam Updated Vital Signs BP 105/67   Pulse 95   Temp 98.7 F (37.1 C) (Oral)   Resp (!) 26   Ht _0  (1.854 m)   Wt 99.3 kg   SpO2 100%   BMI 28.89 kg/m   Physical Exam Vitals and nursing note reviewed.  Constitutional:      Comments: Slight dyspnea  HENT:     Head: Normocephalic and atraumatic.  Eyes:     Conjunctiva/sclera: Conjunctivae normal.  Cardiovascular:     Rate and Rhythm: Normal rate and regular rhythm.  Pulmonary:     Effort: Pulmonary effort is normal.     Breath sounds: Normal breath sounds.  Abdominal:     General: Bowel sounds are normal.     Palpations: Abdomen is soft.  Musculoskeletal:        General: Normal range of motion.     Cervical back: Neck supple.  Skin:    Comments: 4+ peripheral edema.  Neurological:     General: No focal deficit present.     Mental Status: He is alert and oriented to person, place, and time.  Psychiatric:        Behavior: Behavior normal.     ED Results / Procedures / Treatments    Labs (all labs ordered are listed, but only abnormal results are displayed) Labs Reviewed  CBC WITH DIFFERENTIAL/PLATELET - Abnormal; Notable for the following components:      Result Value   WBC 17.9 (*)    RBC 3.55 (*)    Hemoglobin 11.6 (*)    HCT 38.3 (*)    MCV 107.9 (*)    RDW 20.4 (*)    Platelets 32 (*)    Neutro Abs 15.1 (*)    Monocytes Absolute 1.1 (*)    Abs Immature Granulocytes 0.86 (*)    All other components within normal limits  COMPREHENSIVE METABOLIC PANEL - Abnormal; Notable for the following components:   Sodium 134 (*)    CO2 13 (*)    Glucose, Bld 116 (*)    BUN 57 (*)    Creatinine, Ser 4.54 (*)    Calcium 8.5 (*)    Albumin 3.1 (*)    AST 57 (*)    Alkaline Phosphatase 293 (*)    Total Bilirubin 4.4 (*)    GFR calc non Af Amer 12 (*)    GFR calc Af Amer 14 (*)    Anion gap 16 (*)    All other components within normal limits  BRAIN NATRIURETIC PEPTIDE - Abnormal; Notable for the following components:   B Natriuretic Peptide >4,500.0 (*)    All other components within normal limits  TROPONIN I (HIGH SENSITIVITY) - Abnormal; Notable for the following components:   Troponin I (High Sensitivity) 377 (*)    All other components within normal limits  MRSA PCR SCREENING  TROPONIN I (HIGH SENSITIVITY)    EKG EKG Interpretation  Date/Time:  Monday January 30 2020 15:55:34 EDT Ventricular Rate:  107 PR Interval:  190 QRS Duration: 94 QT Interval:  340 QTC Calculation: 453 R Axis:   116 Text Interpretation: Sinus tachycardia Right axis deviation Right ventricular hypertrophy Septal infarct , age undetermined ST & T wave abnormality, consider inferior ischemia ST & T wave abnormality, consider anterolateral ischemia Abnormal ECG Confirmed by Nat Christen [phone]) on 01/30/2020 6:32:26 PM   Radiology DG Chest 2 View  Result Date: 01/30/2020 CLINICAL DATA:  Shortness of breath. Leg swelling for 1 day. EXAM: CHEST - 2 VIEW COMPARISON:  One-view chest  x-ray 01/20/2020 FINDINGS: The heart is enlarged. Loop recorder is in place. Atherosclerotic changes are noted at the aortic arch. New interstitial and airspace opacities are present in the periphery of both lungs. Right greater than left basilar airspace opacities are present. A right pleural effusion is suspected. IMPRESSION: 1. Cardiomegaly without evident edema. 2. Patchy peripheral airspace opacities raises concern for infection. 3. Right greater than left basilar opacity and right pleural effusion is also concerning for infection versus is less likely atelectasis. Electronically Signed   By: San Morelle M.D.   On: 01/30/2020 16:42    Procedures Procedures (including critical care time)  Medications Ordered in ED Medications  ceFEPIme (MAXIPIME) 2 g in sodium chloride 0.9 % 100 mL IVPB (has no administration in time range)  vancomycin (VANCOREADY) IVPB 2000 mg/400 mL (has no administration in time range)  vancomycin variable dose per unstable renal function (pharmacist dosing) (has no administration in time range)    ED Course  I have reviewed the triage vital signs and the nursing notes.  Pertinent labs & imaging results that were available during my care of the patient were reviewed by me and considered in my medical decision making (see chart for details).    MDM Rules/Calculators/A&P                      Chief complaint dyspnea. No chest pain.  White count 17 K.  BNP greater than 4500.  Troponin 377 which could represent demand ischemia.  Chest x-ray reveals patchy airspace opacities concerning for infection. Will initiate intravenous antibiotics for HCAP. Admit to general medicine.  CRITICAL CARE Performed by: Nat Christen Total critical care time: 30 minutes Critical care time was exclusive of separately billable procedures and treating other patients. Critical care was necessary to treat or prevent imminent or life-threatening deterioration. Critical care was time  spent personally by me on the following activities: development of treatment plan with patient and/or surrogate as well as nursing, discussions with consultants, evaluation of patient's response to treatment, examination of patient, obtaining history from patient or surrogate, ordering and performing treatments and interventions, ordering and review of laboratory studies, ordering and review of radiographic studies, pulse oximetry and re-evaluation of patient's condition. Final Clinical Impression(s) / ED Diagnoses Final diagnoses:  Dyspnea, unspecified type  Elevated troponin  HCAP (healthcare-associated pneumonia)  Chronic obstructive pulmonary disease, unspecified COPD type [facility])    Rx / DC Orders ED Discharge Orders    None       Nat Christen, MD 01/30/20 1857    Nat Christen, MD 01/30/20 1911    Nat Christen, MD  01/30/20 1921

## 2020-01-30 NOTE — ED Triage Notes (Signed)
Pt to ED via [facility] with c/o shortness of breath and bil leg edema.  Pt st's his doctor recently took him off of lasix. Pt denies any chest pain.

## 2020-01-30 NOTE — Telephone Encounter (Signed)
Patient should go to ED now.  Patient is in ED now.

## 2020-01-30 NOTE — Consult Note (Addendum)
Cardiology Consultation:   Patient ID: Darren Mueller MRN: [id number]; DOB: January 11, 1951  Admit date: 01/30/2020 Date of Consult: 01/30/2020  Primary Care Provider: [name], MD Primary Cardiologist: [name], MD  Primary Electrophysiologist:  None    Patient Profile:   Darren Mueller is a 69 y.o. male with a hx of COPD, portopulmonary hypertension with RV failure, EtOH induced cirrhosis, history of syncope s/p LINQ recorder in February 2018, CKD stage III, thrombocytopenia, history of seizure disorder, hypertension, and history of heart murmur who is being seen today for the evaluation of volume overload at the request of Dr. Sharl Ma.  History of Present Illness:   Darren Mueller is a morbidly obese 69 year old male with past medical history of COPD, portopulmonary hypertension with RV failure, EtOH induced cirrhosis, history of syncope s/p LINQ recorder in February 2018, CKD stage III, thrombocytopenia, history of seizure disorder, hypertension, and history of heart murmur.  He had a left and right heart cardiac catheterization in June 2017 which showed minimal CAD with moderate PAH and normal cardiac output.  LINQ recorder was placed in February 2018 after syncopal event.  Previous echocardiogram obtained in July 2020 during admission for heart failure showed EF of greater than 65%, severely dilated RV with moderate RV dysfunction.  It was felt that his RV overload is related to portopulmonary HTN and cirrhosis.  Patient was last seen by Dr. Gala Romney on 12/05/2019 at which time, he was placed on torsemide 60 mg twice daily and potassium supplement.  He was admitted from 01/20/2020-01/26/2020 with acute decompensated right heart failure.  Initial his renal function improved with diuresis. Hospitalization course was complicated by overdiuresis with worsening renal function.  He was eventually discharged to resume torsemide 60 mg twice daily.  Discharge weight was 198.8 pounds.  Creatinine  on discharge was 2.71.  Patient returned to [facility] on 01/30/2020 with worsening shortness of breath and weakness.  Initial lab work was significant for BNP greater than 4500, creatinine 4.54, troponin 377 --> 414.  White blood cell count was elevated at 17.9.  Patient complained of significant right lower extremity pain with palpation concerning for cellulitis.  Chest x-ray also showed cardiomegaly without evidence of edema, patchy peripheral airspace opacity concerning for infection, right greater than left basilar opacity also concerning for infection.  Patient was being admitted by hospitalist service.  Cardiology has been consulted for volume overload.  According to the patient, he has been having chills however no fever.  He has not had any urine output in the past 3 days.   Past Medical History:  Diagnosis Date  . Alcohol dependence (HCC) 03/22/2011   In remission   . Alcohol dependence in remission (HCC) 03/22/2011   In remission   . Alcoholic cirrhosis of liver with ascites (HCC) 2014  . Anginal pain (HCC)    with SOB admitted 04/20/2019  . Cardiomegaly 02/2019   Noted on CXR  . CHF (congestive heart failure) (HCC)   . CKD (chronic kidney disease), stage III 07/18/2013  . COPD (chronic obstructive pulmonary disease) (HCC)   . Coronary artery disease   . Dermatitis 06/10/2015  . Diarrhea 09/04/2013  . Dyspnea    admitted 04/20/2019  . Elbow pain, left 03/17/2017  . Eustachian tube dysfunction   . GERD (gastroesophageal reflux disease)   . Gout   . Headache   . Hearing loss   . Heart murmur   . Hx of colonic polyps   . Hypertension   .  Hypertriglyceridemia 03/17/2017  . Hypopotassemia   . Otalgia 11/28/2013  . Other chronic pulmonary heart diseases   . PAH (pulmonary artery hypertension) (HCC)    RV failure  . Pancytopenia (HCC)   . Pleural effusion 2014   Noted on CT:   Moderately large simple layering right pleural effusion   . Preventative health care 06/29/2016  .  Pulmonary nodule 2014   Noted on CT: An 8 mm pulmonary nodule in the periphery of the left lower lobe  . Rectal bleeding   . Red eye 03/03/2016   right  . Seizures (HCC)   . Sleep apnea    does not wear CPAP  . Splenomegaly 12/2015   Noted on CT  . Thoracic aorta atherosclerosis (HCC) 12/2017  . Thrombocytopenia (HCC) 06/29/2016  . Unspecified hypothyroidism 01/25/2013  . Unspecified pleural effusion   . Vertigo   . Wears glasses     Past Surgical History:  Procedure Laterality Date  . CARDIAC CATHETERIZATION N/A 12/21/2015   Procedure: Right Heart Cath;  Surgeon: Dolores Patty, MD;  Location: [facility] INVASIVE CV LAB;  Service: Cardiovascular;  Laterality: N/A;  . CARDIAC CATHETERIZATION N/A 04/15/2016   Procedure: Right/Left Heart Cath and Coronary Angiography;  Surgeon: Dolores Patty, MD;  Location: [facility] INVASIVE CV LAB;  Service: Cardiovascular;  Laterality: N/A;  . COLONOSCOPY N/A 12/08/2013   Procedure: COLONOSCOPY;  Surgeon: Rachael Fee, MD;  Location: [facility] ENDOSCOPY;  Service: Endoscopy;  Laterality: N/A;  . COLONOSCOPY WITH PROPOFOL N/A 03/24/2019   Procedure: COLONOSCOPY WITH PROPOFOL;  Surgeon: Rachael Fee, MD;  Location: [facility] ENDOSCOPY;  Service: Endoscopy;  Laterality: N/A;  Draw CBC in preop  . COLONOSCOPY WITH PROPOFOL N/A 04/28/2019   Procedure: COLONOSCOPY WITH PROPOFOL;  Surgeon: Rachael Fee, MD;  Location: [facility] ENDOSCOPY;  Service: Endoscopy;  Laterality: N/A;  . ESOPHAGOGASTRODUODENOSCOPY (EGD) WITH PROPOFOL N/A 03/27/2016   Procedure: ESOPHAGOGASTRODUODENOSCOPY (EGD) WITH PROPOFOL;  Surgeon: Rachael Fee, MD;  Location: [facility] ENDOSCOPY;  Service: Endoscopy;  Laterality: N/A;  . ESOPHAGOGASTRODUODENOSCOPY (EGD) WITH PROPOFOL N/A 02/14/2019   Procedure: ESOPHAGOGASTRODUODENOSCOPY (EGD) WITH PROPOFOL;  Surgeon: Beverley Fiedler, MD;  Location: [facility] ENDOSCOPY;  Service: Gastroenterology;  Laterality: N/A;  . EYE SURGERY Bilateral 03/15/2019   lasix  . GAS/FLUID EXCHANGE  Right 09/22/2019   Procedure: GAS/FLUID EXCHANGE (C3F8) RIGHT EYE;  Surgeon: Stephannie Li, MD;  Location: [facility] OR;  Service: Ophthalmology;  Laterality: Right;  . HEMOSTASIS CLIP PLACEMENT  04/28/2019   Procedure: HEMOSTASIS CLIP PLACEMENT;  Surgeon: Rachael Fee, MD;  Location: [facility] ENDOSCOPY;  Service: Endoscopy;;  . LOOP RECORDER INSERTION N/A 01/01/2017   Procedure: Loop Recorder Insertion;  Surgeon: Hillis Range, MD;  Location: [facility] INVASIVE CV LAB;  Service: Cardiovascular;  Laterality: N/A;  . PARS PLANA VITRECTOMY Right 09/22/2019   Procedure: PARS PLANA VITRECTOMY WITH 25 GAUGE RIGHT EYE;  Surgeon: Stephannie Li, MD;  Location: [facility] OR;  Service: Ophthalmology;  Laterality: Right;  . PHOTOCOAGULATION WITH LASER Right 09/22/2019   Procedure: PHOTOCOAGULATION WITH LASER RIGHT EYE;  Surgeon: Stephannie Li, MD;  Location: [facility] OR;  Service: Ophthalmology;  Laterality: Right;  . POLYPECTOMY  04/28/2019   Procedure: POLYPECTOMY;  Surgeon: Rachael Fee, MD;  Location: [facility] ENDOSCOPY;  Service: Endoscopy;;  . RIGHT HEART CATHETERIZATION Right 06/06/2014   Procedure: RIGHT HEART CATH;  Surgeon: Dolores Patty, MD;  Location: [facility] CATH LAB;  Service: Cardiovascular;  Laterality: Right;  . UVULOPALATOPHARYNGOPLASTY  1999     Home Medications:  Prior to Admission medications   Medication Sig Start Date End Date Taking? Authorizing Provider  acetaminophen (TYLENOL) 500 MG tablet Take 1,000 mg by mouth every 6 (six) hours as needed (for pain.).   Yes [provider]  allopurinol (ZYLOPRIM) 100 MG tablet Take 2 tablets (200 mg total) by mouth daily. 07/12/19  Yes [name], MD  diphenoxylate-atropine (LOMOTIL) 2.5-0.025 MG tablet Take 1 tablet by mouth 4 (four) times daily as needed for diarrhea or loose stools. 12/01/19  Yes [name], MD  Ferrous Fumarate (HEMOCYTE) 324 (106 Fe) MG TABS tablet Take 1 tablet (106 mg of iron total) by mouth daily. 05/05/19  Yes [name], MD    folic acid (FOLVITE) 1 MG tablet Take 1 tablet (1 mg total) by mouth daily. 01/11/20  Yes Saguier, Ramon Dredge, PA-C  hyoscyamine (LEVSIN SL) 0.125 MG SL tablet Place 1 tablet (0.125 mg total) under the tongue every 6 (six) hours as needed. Patient taking differently: Place 0.125 mg under the tongue every 6 (six) hours as needed for cramping.  08/23/19  Yes [name], MD  macitentan (OPSUMIT) 10 MG tablet Take 1 tablet (10 mg total) by mouth daily. 09/26/19  Yes Bensimhon, Bevelyn Buckles, MD  Multiple Vitamin (MULTIVITAMIN) tablet Take 1 tablet by mouth daily.   Yes [provider]  omeprazole (PRILOSEC) 40 MG capsule TAKE 1 CAPSULE BY MOUTH ONCE DAILY Patient taking differently: Take 40 mg by mouth daily.  11/14/19  Yes Saguier, Ramon Dredge, PA-C  Potassium Chloride ER 20 MEQ TBCR Take 40 mEq by mouth daily. Patient taking differently: Take 10 mEq by mouth in the morning, at noon, and at bedtime.  01/28/20  Yes Almon Hercules, MD  rosuvastatin (CRESTOR) 5 MG tablet TAKE 1 TABLET DAILY Patient taking differently: Take 5 mg by mouth daily at 6 PM.  08/31/19  Yes [name], MD  Selexipag (UPTRAVI) 1000 MCG TABS Take 1,000 mcg by mouth 2 (two) times daily. 06/03/19  Yes Bensimhon, Bevelyn Buckles, MD  SUMAtriptan (IMITREX) 50 MG tablet Take 1 tablet (50 mg total) by mouth daily as needed for migraine or headache. 01/26/20 02/25/20 Yes Almon Hercules, MD  tadalafil, PAH, (ADCIRCA) 20 MG tablet Take 2 tablets (40 mg total) by mouth daily. 11/18/19  Yes Bensimhon, Bevelyn Buckles, MD  thiamine 100 MG tablet Take 1 tablet (100 mg total) by mouth daily. 05/05/19  Yes [name], MD  torsemide (DEMADEX) 20 MG tablet Take 3 tablets (60 mg total) by mouth 2 (two) times daily. 01/28/20  Yes Almon Hercules, MD  ADVAIR HFA [phone] MCG/ACT inhaler USE 2 INHALATIONS EVERY MORNING Patient not taking: No sig reported 11/04/19   [name], MD  Blood Pressure Monitoring (BLOOD PRESSURE CUFF) MISC Use as directed. Check blood  pressure bid prn Dx:I10 02/22/19   [name], MD  traZODone (DESYREL) 50 MG tablet Take 0.5-1 tablets (25-50 mg total) by mouth at bedtime as needed for sleep. Patient not taking: Reported on 01/30/2020 01/11/20   Esperanza Richters, PA-C    Inpatient Medications: Scheduled Meds: . Ferrous Fumarate  1 tablet Oral Daily  . folic acid  1 mg Oral Daily  . heparin  5,000 Units Subcutaneous Q8H  . macitentan  10 mg Oral Daily  . pantoprazole  40 mg Oral Daily  . [START ON 01/31/2020] rosuvastatin  5 mg Oral q1800  . Selexipag  1,000 mcg Oral BID  . sodium chloride flush  3  mL Intravenous Q12H  . [START ON 01/31/2020] tadalafil (PAH)  40 mg Oral Daily  . thiamine  100 mg Oral Daily  . vancomycin variable dose per unstable renal function (pharmacist dosing)   Does not apply See admin instructions   Continuous Infusions: . sodium chloride    . ceFEPime (MAXIPIME) IV    . vancomycin     PRN Meds: sodium chloride, ondansetron **OR** ondansetron (ZOFRAN) IV, sodium chloride flush  Allergies:    Allergies  Allergen Reactions  . Aspirin Hypertension  . Nitroglycerin Other (See Comments)    Severe Headache    Social History:   Social History   Socioeconomic History  . Marital status: Divorced    Spouse name: Not on file  . Number of children: 3  . Years of education: Not on file  . Highest education level: Not on file  Occupational History  . Occupation: Retired    Comment: Company secretary  Tobacco Use  . Smoking status: Former Smoker    Packs/day: 2.00    Years: 5.00    Pack years: 10.00    Types: Cigarettes    Quit date: 09/22/1979    Years since quitting: 40.3  . Smokeless tobacco: Never Used  Substance and Sexual Activity  . Alcohol use: Yes    Alcohol/week: 6.0 standard drinks    Types: 6 Shots of liquor per week    Comment: social drinker  . Drug use: No  . Sexual activity: Yes    Birth control/protection: Spermicide  Other Topics Concern  . Not on file  Social  History Narrative   0 caffeine drinks daily    Social Determinants of Health   Financial Resource Strain: Low Risk   . Difficulty of Paying Living Expenses: Not hard at all  Food Insecurity: No Food Insecurity  . Worried About Programme researcher, broadcasting/film/video in the Last Year: Never true  . Ran Out of Food in the Last Year: Never true  Transportation Needs: No Transportation Needs  . Lack of Transportation (Medical): No  . Lack of Transportation (Non-Medical): No  Physical Activity: Inactive  . Days of Exercise per Week: 0 days  . Minutes of Exercise per Session: 0 min  Stress: Stress Concern Present  . Feeling of Stress : Rather much  Social Connections: Slightly Isolated  . Frequency of Communication with Friends and Family: More than three times a week  . Frequency of Social Gatherings with Friends and Family: Three times a week  . Attends Religious Services: More than 4 times per year  . Active Member of Clubs or Organizations: Yes  . Attends Banker Meetings: More than 4 times per year  . Marital Status: Divorced  Catering manager Violence:   . Fear of Current or Ex-Partner:   . Emotionally Abused:   Marland Kitchen Physically Abused:   . Sexually Abused:     Family History:    Family History  Problem Relation Age of Onset  . Heart disease Mother   . Heart attack Mother   . Hypertension Mother   . Kidney failure Mother   . Liver disease Mother        stage 4 liver disease  . Prostate cancer Father   . Cancer Father        lung  . Hypertension Brother   . Gout Brother   . Alcoholism Maternal Grandfather   . Liver disease Maternal Grandfather   . Migraines Sister   . Hypertension Brother   .  Colon cancer Neg Hx      ROS:  Please see the history of present illness.   All other ROS reviewed and negative.     Physical Exam/Data:   Vitals:   01/30/20 1557 01/30/20 1816 01/30/20 1845 01/30/20 2000  BP: 103/61 98/69 105/67 107/66  Pulse: (!) 106 97 95 90  Resp: (!) 22  (!) 28 (!) 26 (!) 22  Temp: 98.7 F (37.1 C)     TempSrc: Oral     SpO2: 97% 95% 100% 100%  Weight:      Height:       No intake or output data in the 24 hours ending 01/30/20 2037 Last 3 Weights 01/30/2020 01/26/2020 01/25/2020  Weight (lbs) 219 lb 198 lb 14.4 oz 199 lb 14.4 oz  Weight (kg) 99.338 kg 90.22 kg 90.674 kg     Body mass index is 28.89 kg/m.  General:  Well nourished, well developed, in no acute distress HEENT: normal Lymph: no adenopathy Neck: JVP 12 cm, prominent V waves Endocrine:  No thryomegaly Vascular: No carotid bruits; FA pulses 2+ bilaterally without bruits  Cardiac:  normal S1, S2; RRR; 2/6 holosystolic murmur at the left lower sternal border Lungs:  clear to auscultation bilaterally, no wheezing, rhonchi or rales  Abd: soft, nontender, no hepatomegaly  Ext: 2-3+ bilateral lower extremity edema.  The right calf is more swollen, warmer and exquisitely tender, quite different from the left side, although there is no obvious erythema.  He has some fibrotic/nodule/hyperpigmented nodules on both shins, on the right side one of them is larger and ulcerated and may be a portal of infection. Musculoskeletal:  No deformities, BUE and BLE strength normal and equal Skin: warm and dry  Neuro:  CNs 2-12 intact, no focal abnormalities noted Psych:  Normal affect   EKG:  The EKG was personally reviewed and demonstrates: Sinus tachycardia with diffuse T wave inversion Telemetry:  Telemetry was personally reviewed and demonstrates:  NSR without significant ventricular ectopy  Relevant CV Studies:  Echo 01/21/2020 1. Left ventricular ejection fraction, by estimation, is 60 to 65%. The  left ventricle has normal function. The left ventricle has no regional  wall motion abnormalities. There is moderate left ventricular hypertrophy.  Left ventricular diastolic  parameters are consistent with Grade II diastolic dysfunction  (pseudonormalization). There is the interventricular  septum is flattened  in systole and diastole, consistent with right ventricular pressure and  volume overload.  2. Prominent RV trabeculation. Right ventricular systolic function is  severely reduced. The right ventricular size is severely enlarged. mildly  increased right ventricular wall thickness. There is severely elevated  pulmonary artery systolic pressure.  The estimated right ventricular systolic pressure is 83.2 mmHg.  3. Left atrial size was mildly dilated.  4. Right atrial size was severely dilated.  5. The mitral valve is grossly normal. Trivial mitral valve  regurgitation.  6. Tricuspid valve regurgitation is moderate.  7. The aortic valve is tricuspid. Aortic valve regurgitation is mild.  8. The inferior vena cava is dilated in size with <50% respiratory  variability, suggesting right atrial pressure of 15 mmHg.   Laboratory Data:  High Sensitivity Troponin:   Recent Labs  Lab 01/20/20 1503 01/20/20 1649 01/30/20 1605 01/30/20 1830  TROPONINIHS 33* 34* 377* 414*     Chemistry Recent Labs  Lab 01/25/20 0439 01/26/20 0443 01/30/20 1605  NA 137 137 134*  K 5.1 4.4 4.8  CL 107 106 105  CO2 22 23 13*  GLUCOSE 137* 91 116*  BUN 50* 46* 57*  CREATININE 2.90* 2.71* 4.54*  CALCIUM 8.6* 8.5* 8.5*  GFRNONAA 21* 23* 12*  GFRAA 24* 27* 14*  ANIONGAP 8 8 16*    Recent Labs  Lab 01/25/20 0439 01/26/20 0443 01/30/20 1605  PROT  --   --  7.7  ALBUMIN 2.7* 2.6* 3.1*  AST  --   --  57*  ALT  --   --  25  ALKPHOS  --   --  293*  BILITOT  --   --  4.4*   Hematology Recent Labs  Lab 01/25/20 0439 01/26/20 0443 01/30/20 1605  WBC 2.5* 3.2* 17.9*  RBC 2.68* 2.61* 3.55*  HGB 8.7* 8.4* 11.6*  HCT 28.2* 27.7* 38.3*  MCV 105.2* 106.1* 107.9*  MCH 32.5 32.2 32.7  MCHC 30.9 30.3 30.3  RDW 17.8* 18.1* 20.4*  PLT 26* 26* 32*   BNP Recent Labs  Lab 01/30/20 1605  BNP >4,500.0*    DDimer No results for input(s): DDIMER in the last 168  hours.   Radiology/Studies:  DG Chest 2 View  Result Date: 01/30/2020 CLINICAL DATA:  Shortness of breath. Leg swelling for 1 day. EXAM: CHEST - 2 VIEW COMPARISON:  One-view chest x-ray 01/20/2020 FINDINGS: The heart is enlarged. Loop recorder is in place. Atherosclerotic changes are noted at the aortic arch. New interstitial and airspace opacities are present in the periphery of both lungs. Right greater than left basilar airspace opacities are present. A right pleural effusion is suspected. IMPRESSION: 1. Cardiomegaly without evident edema. 2. Patchy peripheral airspace opacities raises concern for infection. 3. Right greater than left basilar opacity and right pleural effusion is also concerning for infection versus is less likely atelectasis. Electronically Signed   By: Marin Roberts M.D.   On: 01/30/2020 16:42         Assessment and Plan:   1. Sepsis: Likely source is right lower extremity cellulitis, but chest x-ray raises possibility for pneumonia as well.  Has evidence of endorgan involvement with acute on chronic renal insufficiency, hypertension and problems earlier with confusion.  Marked elevation in WBC.  Antibiotics managed by managed by hospitalist service.  He is to receive vancomycin which should cover skin pathogens as well.   2. Severe chronic RV failure: Patient has not had any significant urine output for the past 3 days.  His previous discharge weight was 198.8 pounds.  His current admission weight was 218 pounds.  Does not need diuretics right now.  Would focus on treating infection and improving his cardiac output.  If he becomes more hypotensive, would use norepinephrine due to the suspicion of septic shock.  If necessary, can add milrinone to help with pulmonary vasodilation.  Heart failure service to assess the patient tomorrow.    3. Portopulmonary hypertension: Recent echocardiogram obtained on 01/21/2020 showed EF 60- 65%, grade 2 DD, moderate LVH, prominent RV  trabeculation with severely reduced RV systolic function, estimated RVSP 83.2 mmHg, severely enlarged right atrium.  He has longstanding alcoholic cirrhosis.  Unchanged liver appearance on right upper quadrant ultrasound last week.  - on opsumit, uptravi and tadalafil  - Consider adding milrinone if necessary for shock, but only after for starting norepinephrine   4. Elevated troponin: Likely demand ischemia with poor renal function and volume overload  5. Thrombocytopenia/mild anemia: platelet level 32k, similar to last hospitalization.  History of splenic sequestration.  6. Acute on chronic renal insufficiency: No diuretics for now, until his blood pressure/cardiac  output improves.  Suspect part of sepsis syndrome.    CRITICAL CARE Performed by: Rachelle Hora Janya Eveland   Total critical care time: 45 minutes  Critical care time was exclusive of separately billable procedures and treating other patients.  Critical care was necessary to treat or prevent imminent or life-threatening deterioration.  Critical care was time spent personally by me on the following activities: development of treatment plan with patient and/or surrogate as well as nursing, discussions with primary attending, evaluation of patient's response to treatment, examination of patient, obtaining history from patient or surrogate, ordering and performing treatments and interventions, ordering and review of laboratory studies, ordering and review of radiographic studies, pulse oximetry and re-evaluation of patient's condition.   For questions or updates, please contact [facility] Please consult [email or web address] for contact info under

## 2020-01-30 NOTE — H&P (Signed)
TRH H&P    Patient Demographics:    Darren Mueller, is a 69 y.o. male  MRN: [id number]  DOB - 06/20/1951  Admit Date - 01/30/2020  Referring MD/NP/PA: Donnetta Hutching  Outpatient Primary MD for the patient is [name], MD  Patient coming from: Home  Chief complaint-shortness of breath   HPI:    Darren Mueller  is a 69 y.o. male, with history of severe pulmonary hypertension, alcoholic cirrhosis, esophageal varices, minimal CAD on right/left heart cath, CKD stage IIIa, hypertension, COPD presented to the ED with complaints of shortness of breath and worsening leg swelling for past 3 days.  Patient was discharged from the hospital on 01/26/2020 after he was treated for acute on chronic diastolic/RV failure.  At that time patient was treated with IV Lasix.  Patient weight had dropped to 199 pounds. As per patient he started getting worsening shortness of breath for past 3 days.  He denies fever but complains of chills.  Denies coughing up any phlegm.  Denies nausea vomiting or diarrhea.  Denies constipation.  No abdominal pain.  Complains of intermittent abdominal cramping. Patient is not requiring oxygen in the ED. Was found to have bilateral lower extremity edema.  Patient's weight today is 219 pounds. Patient's BNP greater than 4500 Troponin 377, 414 WBC 17.9.    Review of systems:    In addition to the HPI above,    All other systems reviewed and are negative.    Past History of the following :    Past Medical History:  Diagnosis Date  . Alcohol dependence (HCC) 03/22/2011   In remission   . Alcohol dependence in remission (HCC) 03/22/2011   In remission   . Alcoholic cirrhosis of liver with ascites (HCC) 2014  . Anginal pain (HCC)    with SOB admitted 04/20/2019  . Cardiomegaly 02/2019   Noted on CXR  . CHF (congestive heart failure) (HCC)   . CKD (chronic kidney disease), stage III  07/18/2013  . COPD (chronic obstructive pulmonary disease) (HCC)   . Coronary artery disease   . Dermatitis 06/10/2015  . Diarrhea 09/04/2013  . Dyspnea    admitted 04/20/2019  . Elbow pain, left 03/17/2017  . Eustachian tube dysfunction   . GERD (gastroesophageal reflux disease)   . Gout   . Headache   . Hearing loss   . Heart murmur   . Hx of colonic polyps   . Hypertension   . Hypertriglyceridemia 03/17/2017  . Hypopotassemia   . Otalgia 11/28/2013  . Other chronic pulmonary heart diseases   . PAH (pulmonary artery hypertension) (HCC)    RV failure  . Pancytopenia (HCC)   . Pleural effusion 2014   Noted on CT:   Moderately large simple layering right pleural effusion   . Preventative health care 06/29/2016  . Pulmonary nodule 2014   Noted on CT: An 8 mm pulmonary nodule in the periphery of the left lower lobe  . Rectal bleeding   . Red eye 03/03/2016   right  . Seizures (HCC)   .  Sleep apnea    does not wear CPAP  . Splenomegaly 12/2015   Noted on CT  . Thoracic aorta atherosclerosis (HCC) 12/2017  . Thrombocytopenia (HCC) 06/29/2016  . Unspecified hypothyroidism 01/25/2013  . Unspecified pleural effusion   . Vertigo   . Wears glasses       Past Surgical History:  Procedure Laterality Date  . CARDIAC CATHETERIZATION N/A 12/21/2015   Procedure: Right Heart Cath;  Surgeon: Dolores Patty, MD;  Location: [facility] INVASIVE CV LAB;  Service: Cardiovascular;  Laterality: N/A;  . CARDIAC CATHETERIZATION N/A 04/15/2016   Procedure: Right/Left Heart Cath and Coronary Angiography;  Surgeon: Dolores Patty, MD;  Location: [facility] INVASIVE CV LAB;  Service: Cardiovascular;  Laterality: N/A;  . COLONOSCOPY N/A 12/08/2013   Procedure: COLONOSCOPY;  Surgeon: Rachael Fee, MD;  Location: [facility] ENDOSCOPY;  Service: Endoscopy;  Laterality: N/A;  . COLONOSCOPY WITH PROPOFOL N/A 03/24/2019   Procedure: COLONOSCOPY WITH PROPOFOL;  Surgeon: Rachael Fee, MD;  Location: [facility] ENDOSCOPY;  Service:  Endoscopy;  Laterality: N/A;  Draw CBC in preop  . COLONOSCOPY WITH PROPOFOL N/A 04/28/2019   Procedure: COLONOSCOPY WITH PROPOFOL;  Surgeon: Rachael Fee, MD;  Location: [facility] ENDOSCOPY;  Service: Endoscopy;  Laterality: N/A;  . ESOPHAGOGASTRODUODENOSCOPY (EGD) WITH PROPOFOL N/A 03/27/2016   Procedure: ESOPHAGOGASTRODUODENOSCOPY (EGD) WITH PROPOFOL;  Surgeon: Rachael Fee, MD;  Location: [facility] ENDOSCOPY;  Service: Endoscopy;  Laterality: N/A;  . ESOPHAGOGASTRODUODENOSCOPY (EGD) WITH PROPOFOL N/A 02/14/2019   Procedure: ESOPHAGOGASTRODUODENOSCOPY (EGD) WITH PROPOFOL;  Surgeon: Beverley Fiedler, MD;  Location: [facility] ENDOSCOPY;  Service: Gastroenterology;  Laterality: N/A;  . EYE SURGERY Bilateral 03/15/2019   lasix  . GAS/FLUID EXCHANGE Right 09/22/2019   Procedure: GAS/FLUID EXCHANGE (C3F8) RIGHT EYE;  Surgeon: Stephannie Li, MD;  Location: [facility] OR;  Service: Ophthalmology;  Laterality: Right;  . HEMOSTASIS CLIP PLACEMENT  04/28/2019   Procedure: HEMOSTASIS CLIP PLACEMENT;  Surgeon: Rachael Fee, MD;  Location: [facility] ENDOSCOPY;  Service: Endoscopy;;  . LOOP RECORDER INSERTION N/A 01/01/2017   Procedure: Loop Recorder Insertion;  Surgeon: Hillis Range, MD;  Location: [facility] INVASIVE CV LAB;  Service: Cardiovascular;  Laterality: N/A;  . PARS PLANA VITRECTOMY Right 09/22/2019   Procedure: PARS PLANA VITRECTOMY WITH 25 GAUGE RIGHT EYE;  Surgeon: Stephannie Li, MD;  Location: [facility] OR;  Service: Ophthalmology;  Laterality: Right;  . PHOTOCOAGULATION WITH LASER Right 09/22/2019   Procedure: PHOTOCOAGULATION WITH LASER RIGHT EYE;  Surgeon: Stephannie Li, MD;  Location: [facility] OR;  Service: Ophthalmology;  Laterality: Right;  . POLYPECTOMY  04/28/2019   Procedure: POLYPECTOMY;  Surgeon: Rachael Fee, MD;  Location: [facility] ENDOSCOPY;  Service: Endoscopy;;  . RIGHT HEART CATHETERIZATION Right 06/06/2014   Procedure: RIGHT HEART CATH;  Surgeon: Dolores Patty, MD;  Location: [facility] CATH LAB;  Service: Cardiovascular;   Laterality: Right;  . UVULOPALATOPHARYNGOPLASTY  1999      Social History:      Social History   Tobacco Use  . Smoking status: Former Smoker    Packs/day: 2.00    Years: 5.00    Pack years: 10.00    Types: Cigarettes    Quit date: 09/22/1979    Years since quitting: 40.3  . Smokeless tobacco: Never Used  Substance Use Topics  . Alcohol use: Yes    Alcohol/week: 6.0 standard drinks    Types: 6 Shots of liquor per week    Comment: social drinker       Family History :  Family History  Problem Relation Age of Onset  . Heart disease Mother   . Heart attack Mother   . Hypertension Mother   . Kidney failure Mother   . Liver disease Mother        stage 4 liver disease  . Prostate cancer Father   . Cancer Father        lung  . Hypertension Brother   . Gout Brother   . Alcoholism Maternal Grandfather   . Liver disease Maternal Grandfather   . Migraines Sister   . Hypertension Brother   . Colon cancer Neg Hx       Home Medications:   Prior to Admission medications   Medication Sig Start Date End Date Taking? Authorizing Provider  acetaminophen (TYLENOL) 500 MG tablet Take 1,000 mg by mouth every 6 (six) hours as needed (for pain.).   Yes [provider]  allopurinol (ZYLOPRIM) 100 MG tablet Take 2 tablets (200 mg total) by mouth daily. 07/12/19  Yes [name], MD  diphenoxylate-atropine (LOMOTIL) 2.5-0.025 MG tablet Take 1 tablet by mouth 4 (four) times daily as needed for diarrhea or loose stools. 12/01/19  Yes [name], MD  Ferrous Fumarate (HEMOCYTE) 324 (106 Fe) MG TABS tablet Take 1 tablet (106 mg of iron total) by mouth daily. 05/05/19  Yes [name], MD  folic acid (FOLVITE) 1 MG tablet Take 1 tablet (1 mg total) by mouth daily. 01/11/20  Yes Saguier, Ramon Dredge, PA-C  hyoscyamine (LEVSIN SL) 0.125 MG SL tablet Place 1 tablet (0.125 mg total) under the tongue every 6 (six) hours as needed. Patient taking differently: Place 0.125 mg  under the tongue every 6 (six) hours as needed for cramping.  08/23/19  Yes [name], MD  macitentan (OPSUMIT) 10 MG tablet Take 1 tablet (10 mg total) by mouth daily. 09/26/19  Yes Bensimhon, Bevelyn Buckles, MD  Multiple Vitamin (MULTIVITAMIN) tablet Take 1 tablet by mouth daily.   Yes [provider]  omeprazole (PRILOSEC) 40 MG capsule TAKE 1 CAPSULE BY MOUTH ONCE DAILY Patient taking differently: Take 40 mg by mouth daily.  11/14/19  Yes Saguier, Ramon Dredge, PA-C  Potassium Chloride ER 20 MEQ TBCR Take 40 mEq by mouth daily. Patient taking differently: Take 10 mEq by mouth in the morning, at noon, and at bedtime.  01/28/20  Yes Almon Hercules, MD  rosuvastatin (CRESTOR) 5 MG tablet TAKE 1 TABLET DAILY Patient taking differently: Take 5 mg by mouth daily at 6 PM.  08/31/19  Yes [name], MD  Selexipag (UPTRAVI) 1000 MCG TABS Take 1,000 mcg by mouth 2 (two) times daily. 06/03/19  Yes Bensimhon, Bevelyn Buckles, MD  SUMAtriptan (IMITREX) 50 MG tablet Take 1 tablet (50 mg total) by mouth daily as needed for migraine or headache. 01/26/20 02/25/20 Yes Almon Hercules, MD  tadalafil, PAH, (ADCIRCA) 20 MG tablet Take 2 tablets (40 mg total) by mouth daily. 11/18/19  Yes Bensimhon, Bevelyn Buckles, MD  thiamine 100 MG tablet Take 1 tablet (100 mg total) by mouth daily. 05/05/19  Yes [name], MD  torsemide (DEMADEX) 20 MG tablet Take 3 tablets (60 mg total) by mouth 2 (two) times daily. 01/28/20  Yes Almon Hercules, MD  ADVAIR HFA [phone] MCG/ACT inhaler USE 2 INHALATIONS EVERY MORNING Patient not taking: No sig reported 11/04/19   [name], MD  Blood Pressure Monitoring (BLOOD PRESSURE CUFF) MISC Use as directed. Check blood pressure bid prn Dx:I10 02/22/19  [name], MD  traZODone (DESYREL) 50 MG tablet Take 0.5-1 tablets (25-50 mg total) by mouth at bedtime as needed for sleep. Patient not taking: Reported on 01/30/2020 01/11/20   Saguier, Ramon Dredge, PA-C     Allergies:     Allergies  Allergen  Reactions  . Aspirin Hypertension  . Nitroglycerin Other (See Comments)    Severe Headache     Physical Exam:   Vitals  Blood pressure 105/67, pulse 95, temperature 98.7 F (37.1 C), temperature source Oral, resp. rate (!) 26, height 6\' 1"  (1.854 m), weight 99.3 kg, SpO2 100 %.  1.  General: Appears in no acute distress  2. Psychiatric: Alert, oriented x3, intact insight and judgment  3. Neurologic: Cranial nerves II through grossly intact, no focal deficit noted  4. HEENMT:  Atraumatic normocephalic, extraocular muscles are intact  5. Respiratory : Bibasilar crackles auscultated  6. Cardiovascular : S1-S2, regular, no murmur auscultated, bilateral 4+ pitting edema in the lower extremities  7. Gastrointestinal:  Abdomen is soft, nontender, no organomegaly  8. Skin:  No rashes noted      Data Review:    CBC Recent Labs  Lab 01/24/20 0420 01/25/20 0439 01/26/20 0443 01/30/20 1605  WBC 1.6* 2.5* 3.2* 17.9*  HGB 9.2* 8.7* 8.4* 11.6*  HCT 30.6* 28.2* 27.7* 38.3*  PLT 23* 26* 26* 32*  MCV 106.3* 105.2* 106.1* 107.9*  MCH 31.9 32.5 32.2 32.7  MCHC 30.1 30.9 30.3 30.3  RDW 17.9* 17.8* 18.1* 20.4*  LYMPHSABS  --   --   --  0.8  MONOABS  --   --   --  1.1*  EOSABS  --   --   --  0.0  BASOSABS  --   --   --  0.0   ------------------------------------------------------------------------------------------------------------------  Results for orders placed or performed during the hospital encounter of 01/30/20 (from the past 48 hour(s))  CBC with Differential     Status: Abnormal   Collection Time: 01/30/20  4:05 PM  Result Value Ref Range   WBC 17.9 (H) 4.0 - 10.5 K/uL   RBC 3.55 (L) 4.22 - 5.81 MIL/uL   Hemoglobin 11.6 (L) 13.0 - 17.0 g/dL   HCT 21.3 (L) 08.6 - 57.8 %   MCV 107.9 (H) 80.0 - 100.0 fL   MCH 32.7 26.0 - 34.0 pg   MCHC 30.3 30.0 - 36.0 g/dL   RDW 46.9 (H) 62.9 - 52.8 %   Platelets 32 (L) 150 - 400 K/uL    Comment: REPEATED TO  VERIFY PLATELET COUNT CONFIRMED BY SMEAR SPECIMEN CHECKED FOR CLOTS Immature Platelet Fraction may be clinically indicated, consider ordering this additional test [id number]    nRBC 0.1 0.0 - 0.2 %   Neutrophils Relative % 84 %   Neutro Abs 15.1 (H) 1.7 - 7.7 K/uL   Lymphocytes Relative 5 %   Lymphs Abs 0.8 0.7 - 4.0 K/uL   Monocytes Relative 6 %   Monocytes Absolute 1.1 (H) 0.1 - 1.0 K/uL   Eosinophils Relative 0 %   Eosinophils Absolute 0.0 0.0 - 0.5 K/uL   Basophils Relative 0 %   Basophils Absolute 0.0 0.0 - 0.1 K/uL   Immature Granulocytes 5 %   Abs Immature Granulocytes 0.86 (H) 0.00 - 0.07 K/uL    Comment: Performed at [facility], [address] [place], [place] [address]  Comprehensive metabolic panel     Status: Abnormal   Collection Time: 01/30/20  4:05 PM  Result Value  Ref Range   Sodium 134 (L) 135 - 145 mmol/L   Potassium 4.8 3.5 - 5.1 mmol/L   Chloride 105 98 - 111 mmol/L   CO2 13 (L) 22 - 32 mmol/L   Glucose, Bld 116 (H) 70 - 99 mg/dL    Comment: Glucose reference range applies only to samples taken after fasting for at least 8 hours.   BUN 57 (H) 8 - 23 mg/dL   Creatinine, Ser 7.56 (H) 0.61 - 1.24 mg/dL   Calcium 8.5 (L) 8.9 - 10.3 mg/dL   Total Protein 7.7 6.5 - 8.1 g/dL   Albumin 3.1 (L) 3.5 - 5.0 g/dL   AST 57 (H) 15 - 41 U/L   ALT 25 0 - 44 U/L   Alkaline Phosphatase 293 (H) 38 - 126 U/L   Total Bilirubin 4.4 (H) 0.3 - 1.2 mg/dL   GFR calc non Af Amer 12 (L) >60 mL/min   GFR calc Af Amer 14 (L) >60 mL/min   Anion gap 16 (H) 5 - 15    Comment: Performed at [facility], [address], [place], [place] [address]  Brain natriuretic peptide     Status: Abnormal   Collection Time: 01/30/20  4:05 PM  Result Value Ref Range   B Natriuretic Peptide >4,500.0 (H) 0.0 - 100.0 pg/mL    Comment: Performed at [facility], [address], [place], [place] [address]  Troponin I (High Sensitivity)     Status: Abnormal   Collection Time:  01/30/20  4:05 PM  Result Value Ref Range   Troponin I (High Sensitivity) 377 (HH) <18 ng/L    Comment: CRITICAL RESULT CALLED TO, READ BACK BY AND VERIFIED WITH: C STRAUGHN,RN 1725 01/30/2020 WBOND (NOTE) Elevated high sensitivity troponin I (hsTnI) values and significant  changes across serial measurements may suggest ACS but many other  chronic and acute conditions are known to elevate hsTnI results.  Refer to the Links section for chest pain algorithms and additional  guidance. Performed at [facility], [address], [place], [place] [address]   Troponin I (High Sensitivity)     Status: Abnormal   Collection Time: 01/30/20  6:30 PM  Result Value Ref Range   Troponin I (High Sensitivity) 414 (HH) <18 ng/L    Comment: CRITICAL VALUE NOTED.  VALUE IS CONSISTENT WITH PREVIOUSLY REPORTED AND CALLED VALUE. (NOTE) Elevated high sensitivity troponin I (hsTnI) values and significant  changes across serial measurements may suggest ACS but many other  chronic and acute conditions are known to elevate hsTnI results.  Refer to the Links section for chest pain algorithms and additional  guidance. Performed at [facility], [address], [place], [place] [address]     Chemistries  Recent Labs  Lab 01/24/20 [phone] 01/25/20 0439 01/26/20 0443 01/30/20 1605  NA 138 137 137 134*  K 4.3 5.1 4.4 4.8  CL 105 107 106 105  CO2 24 22 23  13*  GLUCOSE 146* 137* 91 116*  BUN 43* 50* 46* 57*  CREATININE 3.12* 2.90* 2.71* 4.54*  CALCIUM 8.9 8.6* 8.5* 8.5*  MG 2.3 2.2 2.3  --   AST  --   --   --  57*  ALT  --   --   --  25  ALKPHOS  --   --   --  293*  BILITOT  --   --   --  4.4*   ------------------------------------------------------------------------------------------------------------------  ------------------------------------------------------------------------------------------------------------------ GFR: Estimated Creatinine  Clearance: 19 mL/min (A) (by C-G formula  based on SCr of 4.54 mg/dL (H)). Liver Function Tests: Recent Labs  Lab 01/24/20 0420 01/25/20 0439 01/26/20 0443 01/30/20 1605  AST  --   --   --  57*  ALT  --   --   --  25  ALKPHOS  --   --   --  293*  BILITOT  --   --   --  4.4*  PROT  --   --   --  7.7  ALBUMIN 2.8* 2.7* 2.6* 3.1*    Recent Labs    10/11/19 1138 12/08/19 1041 01/11/20 1439  PROBNP 604.0* 710.0* 494.0*    --------------------------------------------------------------------------------------------------------------- Urine analysis:    Component Value Date/Time   COLORURINE YELLOW 11/18/2017 0250   APPEARANCEUR CLEAR 11/18/2017 0250   LABSPEC 1.008 11/18/2017 0250   PHURINE 5.0 11/18/2017 0250   GLUCOSEU NEGATIVE 11/18/2017 0250   HGBUR NEGATIVE 11/18/2017 0250   BILIRUBINUR neg 12/18/2017 1352   KETONESUR NEGATIVE 11/18/2017 0250   PROTEINUR net 12/18/2017 1352   PROTEINUR NEGATIVE 11/18/2017 0250   UROBILINOGEN 0.2 12/18/2017 1352   UROBILINOGEN 0.2 10/18/2013 1247   NITRITE neg 12/18/2017 1352   NITRITE NEGATIVE 11/18/2017 0250   LEUKOCYTESUR Negative 12/18/2017 1352      Imaging Results:    DG Chest 2 View  Result Date: 01/30/2020 CLINICAL DATA:  Shortness of breath. Leg swelling for 1 day. EXAM: CHEST - 2 VIEW COMPARISON:  One-view chest x-ray 01/20/2020 FINDINGS: The heart is enlarged. Loop recorder is in place. Atherosclerotic changes are noted at the aortic arch. New interstitial and airspace opacities are present in the periphery of both lungs. Right greater than left basilar airspace opacities are present. A right pleural effusion is suspected. IMPRESSION: 1. Cardiomegaly without evident edema. 2. Patchy peripheral airspace opacities raises concern for infection. 3. Right greater than left basilar opacity and right pleural effusion is also concerning for infection versus is less likely atelectasis. Electronically Signed   By: Marin Roberts M.D.   On: 01/30/2020 16:42    My  personal review of EKG: Rhythm NSR, T wave inversions noted in 2 3 aVF, anterior chest leads V1, V2, V3, V4 V5.  Unchanged from previous EKG.   Assessment & Plan:    Active Problems:   Acute exacerbation of CHF (congestive heart failure) (HCC)   1. Acute exacerbation of CHF-diastolic/right heart failure-patient presenting with 20 pound weight gain since discharge from 01/26/2020.  Patient has bibasilar crackles with marked bilateral lower extremity edema.  BNP elevated greater than 4500.  Patient blood pressure is soft with systolic blood pressure in 90s.  Patient was discharged on torsemide, creatinine has jumped from 2.71-4.54 today.  Will consult cardiology, patient will need inotropic support with milrinone along with high-dose IV Lasix. 2. ?  Community-acquired pneumonia-patient's chest x-ray shows patchy peripheral airspace opacities raising concern for infection.  He also has elevated white count of 17,000.  Patient was started on vancomycin and cefepime in the ED.  Will obtain lactic acid, procalcitonin, blood culture.  If low suspicion for infection, consider discontinuing antibiotics in next 24 hours. 3. Alcoholic liver cirrhosis-stable, patient has minimally elevated LFTs.  Follow LFTs in a.m. 4. Elevated troponin-patient has minimally elevated troponin, likely from CHF exacerbation.  He does have EKG changes with T wave inversions.  Patient's right and left heart cath showed minimal CAD.  Cardiology has been consulted for further recommendations. 5. Acute kidney injury on CKD stage IIIb-patient baseline  creatinine is around 2.  Today presenting with creatinine of 4.54.  Likely will need high-dose IV Lasix along with milrinone.  Cardiology has been consulted.  Will follow renal function in a.m. 6. Severe pulmonary hypertension-continue selexipag, macitentan, tadalafil.    DVT Prophylaxis-     AM Labs Ordered, also please review Full Orders  Family Communication: Admission, patients  condition and plan of care including tests being ordered have been discussed with the patient and sister, patient's son at bedside who indicate understanding and agree with the plan and Code Status.  Code Status: Full code  Admission status: Observation/Inpatient :The appropriate admission status for this patient is INPATIENT. Inpatient status is judged to be reasonable and necessary in order to provide the required intensity of service to ensure the patient's safety. The patient's presenting symptoms, physical exam findings, and initial radiographic and laboratory data in the context of their chronic comorbidities is felt to place them at high risk for further clinical deterioration. Furthermore, it is not anticipated that the patient will be medically stable for discharge from the hospital within 2 midnights of admission. The following factors support the admission status of inpatient.     The patient's presenting symptoms include shortness of breath. The worrisome physical exam findings include bilateral lower extremity edema, bibasilar crackles. The initial radiographic and laboratory data are worrisome because of acute exacerbation of CHF. The chronic co-morbidities include pulmonary hypertension.       * I certify that at the point of admission it is my clinical judgment that the patient will require inpatient hospital care spanning beyond 2 midnights from the point of admission due to high intensity of service, high risk for further deterioration and high frequency of surveillance required.*  Time spent in minutes : 60 minutes   Tamera Pingley S Vonte Rossin M.D

## 2020-01-30 NOTE — Progress Notes (Signed)
Pharmacy Antibiotic Note  KYEL PURK is a 69 y.o. male admitted on 01/30/2020 with pneumonia.  Pharmacy has been consulted for cefepime and vancomycin dosing.  CKD3 with SCr 4.54 (>2x baseline)  Plan: Vancomycin 2058m IV x 1, then variable dosing based on renal function Cefepime 2g IV every 24 hours Add MRSA PCR Monitor renal function, PCR to narrow Vancomycin random level as needed   Height: 6' 1" (185.4 cm) Weight: 219 lb (99.3 kg) IBW/kg (Calculated) : 79.9  Temp (24hrs), Avg:98.7 F (37.1 C), Min:98.7 F (37.1 C), Max:98.7 F (37.1 C)  Recent Labs  Lab 01/24/20 0420 01/25/20 0439 01/26/20 0443 01/30/20 1605  WBC 1.6* 2.5* 3.2* 17.9*  CREATININE 3.12* 2.90* 2.71* 4.54*    Estimated Creatinine Clearance: 19 mL/min (A) (by C-G formula based on SCr of 4.54 mg/dL (H)).    Allergies  Allergen Reactions  . Aspirin Hypertension  . Nitroglycerin Other (See Comments)    Severe Headache    Antimicrobials this admission: Vanc 3/29>> Cefepime 3/29>>  Dose adjustments this admission: n/a  Microbiology results:   JBertis Ruddy PharmD Clinical Pharmacist ED Pharmacist Phone # [phone]/29/2021 6:59 PM

## 2020-01-31 ENCOUNTER — Encounter (HOSPITAL_COMMUNITY): Payer: Self-pay | Admitting: Family Medicine

## 2020-01-31 DIAGNOSIS — E039 Hypothyroidism, unspecified: Secondary | ICD-10-CM

## 2020-01-31 DIAGNOSIS — F1029 Alcohol dependence with unspecified alcohol-induced disorder: Secondary | ICD-10-CM

## 2020-01-31 DIAGNOSIS — L03119 Cellulitis of unspecified part of limb: Secondary | ICD-10-CM

## 2020-01-31 DIAGNOSIS — I50813 Acute on chronic right heart failure: Secondary | ICD-10-CM

## 2020-01-31 DIAGNOSIS — E785 Hyperlipidemia, unspecified: Secondary | ICD-10-CM

## 2020-01-31 DIAGNOSIS — I10 Essential (primary) hypertension: Secondary | ICD-10-CM

## 2020-01-31 DIAGNOSIS — L02419 Cutaneous abscess of limb, unspecified: Secondary | ICD-10-CM

## 2020-01-31 LAB — CBC WITH DIFFERENTIAL/PLATELET
Abs Immature Granulocytes: 0.2 10*3/uL — ABNORMAL HIGH (ref 0.00–0.07)
Basophils Absolute: 0 10*3/uL (ref 0.0–0.1)
Basophils Relative: 0 %
Eosinophils Absolute: 0 10*3/uL (ref 0.0–0.5)
Eosinophils Relative: 0 %
HCT: 32.3 % — ABNORMAL LOW (ref 39.0–52.0)
Hemoglobin: 9.9 g/dL — ABNORMAL LOW (ref 13.0–17.0)
Immature Granulocytes: 2 %
Lymphocytes Relative: 8 %
Lymphs Abs: 0.9 10*3/uL (ref 0.7–4.0)
MCH: 33.1 pg (ref 26.0–34.0)
MCHC: 30.7 g/dL (ref 30.0–36.0)
MCV: 108 fL — ABNORMAL HIGH (ref 80.0–100.0)
Monocytes Absolute: 0.8 10*3/uL (ref 0.1–1.0)
Monocytes Relative: 7 %
Neutro Abs: 8.8 10*3/uL — ABNORMAL HIGH (ref 1.7–7.7)
Neutrophils Relative %: 83 %
Platelets: 20 10*3/uL — CL (ref 150–400)
RBC: 2.99 MIL/uL — ABNORMAL LOW (ref 4.22–5.81)
RDW: 20.5 % — ABNORMAL HIGH (ref 11.5–15.5)
WBC: 10.7 10*3/uL — ABNORMAL HIGH (ref 4.0–10.5)
nRBC: 0 % (ref 0.0–0.2)

## 2020-01-31 LAB — COMPREHENSIVE METABOLIC PANEL
ALT: 19 U/L (ref 0–44)
AST: 42 U/L — ABNORMAL HIGH (ref 15–41)
Albumin: 2.5 g/dL — ABNORMAL LOW (ref 3.5–5.0)
Alkaline Phosphatase: 217 U/L — ABNORMAL HIGH (ref 38–126)
Anion gap: 14 (ref 5–15)
BUN: 68 mg/dL — ABNORMAL HIGH (ref 8–23)
CO2: 15 mmol/L — ABNORMAL LOW (ref 22–32)
Calcium: 8 mg/dL — ABNORMAL LOW (ref 8.9–10.3)
Chloride: 105 mmol/L (ref 98–111)
Creatinine, Ser: 4.41 mg/dL — ABNORMAL HIGH (ref 0.61–1.24)
GFR calc Af Amer: 15 mL/min — ABNORMAL LOW (ref >60)
GFR calc non Af Amer: 13 mL/min — ABNORMAL LOW (ref >60)
Glucose, Bld: 96 mg/dL (ref 70–99)
Potassium: 4.6 mmol/L (ref 3.5–5.1)
Sodium: 134 mmol/L — ABNORMAL LOW (ref 135–145)
Total Bilirubin: 3 mg/dL — ABNORMAL HIGH (ref 0.3–1.2)
Total Protein: 6 g/dL — ABNORMAL LOW (ref 6.5–8.1)

## 2020-01-31 LAB — SARS CORONAVIRUS 2 (TAT 6-24 HRS): SARS Coronavirus 2: NEGATIVE

## 2020-01-31 MED ORDER — FUROSEMIDE 10 MG/ML IJ SOLN
80.0000 mg | Freq: Two times a day (BID) | INTRAMUSCULAR | Status: DC
  Administered 2020-01-31 – 2020-02-03 (×6): 80 mg via INTRAVENOUS
  Filled 2020-01-31 (×6): qty 8

## 2020-01-31 NOTE — ED Notes (Signed)
Lunch Tray Ordered @ 1033. 

## 2020-01-31 NOTE — Progress Notes (Signed)
PHARMACY - PHYSICIAN COMMUNICATION CRITICAL VALUE ALERT - BLOOD CULTURE IDENTIFICATION (BCID)  Darren Mueller is an 69 y.o. male who presented to [facility] on [facility]/30/2020 with a chief complaint of SOB, worsening leg swelling x 3 days  Assessment:  52 yom presenting with acute CHF exacerbation, continuing on vancomycin/cefepime for r/o PNA, questionable cellulitis. Only 1 set of BCx drawn. 1 of 2 BCx bottles (aerobic) growing GPC in clusters.  Name of physician (or Provider) Contacted: Pricilla Holm via Epic secure message  Current antibiotics: vancomycin/cefepime  Changes to prescribed antibiotics recommended:  Patient is on recommended antibiotics - No changes needed  No results found for this or any previous visit.   Arturo Morton, PharmD, BCPS Please check AMION for all [facility] contact numbers Clinical Pharmacist 01/31/2020 4:54 PM

## 2020-01-31 NOTE — ED Notes (Signed)
Tele   bfast ordered  

## 2020-01-31 NOTE — Consult Note (Addendum)
Advanced Heart Failure Team Consult Note   Primary Physician: Mosie Lukes, MD PCP-Cardiologist:  Glori Bickers, MD  Reason for Consultation: Pulmonary Hypertension   HPI:    Darren Mueller is seen today for evaluation of pulmonary hypertension at the request of Dr Orene Desanctis.  Darren Mueller is a 69 year old with a history of COPD, PAH, RV failure, cirrhosis, and ETOH abuse.    On triple therapy for [place] Uptravi Macitentan  Adcirca   Admitted in March with increased shortness of breath. Diuresed with IV lasix and later transitioned to torsemide 60 mg twice a day. Hospital course complicated by AKI. Discharged on 01/26/20 with weight 199 pounds.    Presented to [facility] ED with increased dyspnea and leg edema. Weight has gone  up 20 pounds since discharge.Says he has been taking all medications.  Admitted with cellulitis and A/C RV failure. CXR with R>L basilar opacity and R pleural effusion. Pertinent admission labs included: BNP > 4500, Creatinine 4.54, K 4.8, sodium 134, WBC 17.9, Hgb 11.4, and HS Trop 377>414.   Blood cultures obtained and started on antibiotics.   Cardiac Testing  Echo -EF 60-65% RV markedly dilated and hypokinetic with D-shaped septum. RVSP 15mHG  Cath 04/2016 Mid RCA lesion, 20% stenosed. Dist LAD lesion, 20% stenosed. Ao = 107/70 (87) LV = 106/3/11 RA = 4 RV = 74/11 PA = 74/26 (45) PCW = 6 Fick cardiac output/index = 4.6.2.2 PVR = 8.5 Ao sat = 95% PA sat = 61%, 58%  RHC 12/2015 RA = 11 RV = 80/16/19 PA = 83/61 (71) PCW = 9 Fick cardiac output/index = 5.6/2.6 PVR = 10.3 WU Ao sat = 94% PA sat = 65%, 68% High SVC sat = 65%   Review of Systems: [y] = yes, _0  = no   . General: Weight gain [Y ]; Weight loss _1 ; Anorexia _2 ; Fatigue [Y ]; Fever _3 ; Chills _4 ; Weakness _5   . Cardiac: Chest pain/pressure _6 ; Resting SOB [ Y]; Exertional SOB [Y ]; Orthopnea [Y ]; Pedal Edema [Y ]; Palpitations _7 ; Syncope _8 ; Presyncope _9 ;  Paroxysmal nocturnal dyspnea_10   . Pulmonary: Cough _11 ; Wheezing_12 ; Hemoptysis_13 ; Sputum _14 ; Snoring _15   . GI: Vomiting_16 ; Dysphagia_17 ; Melena_18 ; Hematochezia _19 ; Heartburn_20 ; Abdominal pain _21 ; Constipation _22 ; Diarrhea _23 ; BRBPR _24   . GU: Hematuria_25 ; Dysuria _26 ; Nocturia_27   . Vascular: Pain in legs with walking _28 ; Pain in feet with lying flat _29 ; Non-healing sores _30 ; Stroke _31 ; TIA _32 ; Slurred speech _33 ;  . Neuro: Headaches_34 ; Vertigo_35 ; Seizures_36 ; Paresthesias_37 ;Blurred vision _38 ; Diplopia _39 ; Vision changes _40   . Ortho/Skin: Arthritis _41 ; Joint pain [ Y]; Muscle pain _42 ; Joint swelling _43 ; Back Pain [Y ]; Rash _44   . Psych: Depression_45 ; Anxiety_46   . Heme: Bleeding problems _47 ; Clotting disorders _48 ; Anemia _49   . Endocrine: Diabetes _50 ; Thyroid dysfunction_51   Home Medications Prior to Admission medications   Medication Sig Start Date End Date Taking? Authorizing Provider  acetaminophen (TYLENOL) 500 MG tablet Take 1,000 mg by mouth every 6 (six) hours as needed (for pain.).   Yes [provider]  allopurinol (ZYLOPRIM) 100 MG tablet Take 2 tablets (200 mg total) by mouth daily. 07/12/19  Yes BMosie Lukes  MD  diphenoxylate-atropine (LOMOTIL) 2.5-0.025 MG tablet Take 1 tablet by mouth 4 (four) times daily as needed for diarrhea or loose stools. 12/01/19  Yes Mosie Lukes, MD  Ferrous Fumarate (HEMOCYTE) 324 (106 Fe) MG TABS tablet Take 1 tablet (106 mg of iron total) by mouth daily. 05/05/19  Yes Mosie Lukes, MD  folic acid (FOLVITE) 1 MG tablet Take 1 tablet (1 mg total) by mouth daily. 01/11/20  Yes Saguier, Percell Miller, PA-C  hyoscyamine (LEVSIN SL) 0.125 MG SL tablet Place 1 tablet (0.125 mg total) under the tongue every 6 (six) hours as needed. Patient taking differently: Place 0.125 mg under the tongue every 6 (six) hours as needed for cramping.  08/23/19  Yes Mosie Lukes, MD  macitentan (OPSUMIT) 10 MG tablet Take 1 tablet (10 mg  total) by mouth daily. 09/26/19  Yes Jeania Nater, Shaune Pascal, MD  Multiple Vitamin (MULTIVITAMIN) tablet Take 1 tablet by mouth daily.   Yes [provider]  omeprazole (PRILOSEC) 40 MG capsule TAKE 1 CAPSULE BY MOUTH ONCE DAILY Patient taking differently: Take 40 mg by mouth daily.  11/14/19  Yes Saguier, Percell Miller, PA-C  Potassium Chloride ER 20 MEQ TBCR Take 40 mEq by mouth daily. Patient taking differently: Take 10 mEq by mouth in the morning, at noon, and at bedtime.  01/28/20  Yes Mercy Riding, MD  rosuvastatin (CRESTOR) 5 MG tablet TAKE 1 TABLET DAILY Patient taking differently: Take 5 mg by mouth daily at 6 PM.  08/31/19  Yes Mosie Lukes, MD  Selexipag (UPTRAVI) 1000 MCG TABS Take 1,000 mcg by mouth 2 (two) times daily. 06/03/19  Yes Laquilla Dault, Shaune Pascal, MD  SUMAtriptan (IMITREX) 50 MG tablet Take 1 tablet (50 mg total) by mouth daily as needed for migraine or headache. 01/26/20 02/25/20 Yes Mercy Riding, MD  tadalafil, PAH, (ADCIRCA) 20 MG tablet Take 2 tablets (40 mg total) by mouth daily. 11/18/19  Yes Abbygael Curtiss, Shaune Pascal, MD  thiamine 100 MG tablet Take 1 tablet (100 mg total) by mouth daily. 05/05/19  Yes Mosie Lukes, MD  torsemide (DEMADEX) 20 MG tablet Take 3 tablets (60 mg total) by mouth 2 (two) times daily. 01/28/20  Yes Mercy Riding, MD  ADVAIR HFA [phone] MCG/ACT inhaler USE 2 INHALATIONS EVERY MORNING Patient not taking: No sig reported 11/04/19   Mosie Lukes, MD  Blood Pressure Monitoring (BLOOD PRESSURE CUFF) MISC Use as directed. Check blood pressure bid prn Dx:I10 02/22/19   Mosie Lukes, MD  traZODone (DESYREL) 50 MG tablet Take 0.5-1 tablets (25-50 mg total) by mouth at bedtime as needed for sleep. Patient not taking: Reported on 01/30/2020 01/11/20   Elise Benne    Past Medical History: Past Medical History:  Diagnosis Date  . Alcohol dependence ([place]) 03/22/2011   In remission   . Alcohol dependence in remission ([place]) 03/22/2011   In remission   .  Alcoholic cirrhosis of liver with ascites ([place]) 2014  . Anginal pain ([place])    with SOB admitted 04/20/2019  . Cardiomegaly 02/2019   Noted on CXR  . CHF (congestive heart failure) ([place])   . CKD (chronic kidney disease), stage III 07/18/2013  . COPD (chronic obstructive pulmonary disease) ([place])   . Coronary artery disease   . Dermatitis 06/10/2015  . Diarrhea 09/04/2013  . Dyspnea    admitted 04/20/2019  . Elbow pain, left 03/17/2017  . Eustachian tube dysfunction   . GERD (gastroesophageal reflux disease)   .  Gout   . Headache   . Hearing loss   . Heart murmur   . Hx of colonic polyps   . Hypertension   . Hypertriglyceridemia 03/17/2017  . Hypopotassemia   . Otalgia 11/28/2013  . Other chronic pulmonary heart diseases   . PAH (pulmonary artery hypertension) ([facility])    RV failure  . Pancytopenia ([place])   . Pleural effusion 2014   Noted on CT:   Moderately large simple layering right pleural effusion   . Preventative health care 06/29/2016  . Pulmonary nodule 2014   Noted on CT: An 8 mm pulmonary nodule in the periphery of the left lower lobe  . Rectal bleeding   . Red eye 03/03/2016   right  . Seizures ([place])   . Sleep apnea    does not wear CPAP  . Splenomegaly 12/2015   Noted on CT  . Thoracic aorta atherosclerosis ([place]) 12/2017  . Thrombocytopenia ([place]) 06/29/2016  . Unspecified hypothyroidism 01/25/2013  . Unspecified pleural effusion   . Vertigo   . Wears glasses     Past Surgical History: Past Surgical History:  Procedure Laterality Date  . CARDIAC CATHETERIZATION N/A 12/21/2015   Procedure: Right Heart Cath;  Surgeon: Jolaine Artist, MD;  Location: [place] [facility];  Service: Cardiovascular;  Laterality: N/A;  . CARDIAC CATHETERIZATION N/A 04/15/2016   Procedure: Right/Left Heart Cath and Coronary Angiography;  Surgeon: Jolaine Artist, MD;  Location: [place] [facility];  Service: Cardiovascular;  Laterality: N/A;  . COLONOSCOPY N/A 12/08/2013   Procedure:  COLONOSCOPY;  Surgeon: Milus Banister, MD;  Location: [facility];  Service: Endoscopy;  Laterality: N/A;  . COLONOSCOPY WITH PROPOFOL N/A 03/24/2019   Procedure: COLONOSCOPY WITH PROPOFOL;  Surgeon: Milus Banister, MD;  Location: [facility];  Service: Endoscopy;  Laterality: N/A;  Draw CBC in preop  . COLONOSCOPY WITH PROPOFOL N/A 04/28/2019   Procedure: COLONOSCOPY WITH PROPOFOL;  Surgeon: Milus Banister, MD;  Location: [facility];  Service: Endoscopy;  Laterality: N/A;  . ESOPHAGOGASTRODUODENOSCOPY (EGD) WITH PROPOFOL N/A 03/27/2016   Procedure: ESOPHAGOGASTRODUODENOSCOPY (EGD) WITH PROPOFOL;  Surgeon: Milus Banister, MD;  Location: [facility];  Service: Endoscopy;  Laterality: N/A;  . ESOPHAGOGASTRODUODENOSCOPY (EGD) WITH PROPOFOL N/A 02/14/2019   Procedure: ESOPHAGOGASTRODUODENOSCOPY (EGD) WITH PROPOFOL;  Surgeon: Jerene Bears, MD;  Location[facility] ENDOSCOPY;  Service: Gastroenterology;  Laterality: N/A;  . EYE SURGERY Bilateral 03/15/2019   lasix  . GAS/FLUID EXCHANGE Right 09/22/2019   Procedure: GAS/FLUID EXCHANGE (C3F8) RIGHT EYE;  Surgeon: Sherlynn Stalls, MD;  Location: [place];  Service: Ophthalmology;  Laterality: Right;  . HEMOSTASIS CLIP PLACEMENT  04/28/2019   Procedure: HEMOSTASIS CLIP PLACEMENT;  Surgeon: Milus Banister, MD;  Location: [facility];  Service: Endoscopy;;  . LOOP RECORDER INSERTION N/A 01/01/2017   Procedure: Loop Recorder Insertion;  Surgeon: Thompson Grayer, MD;  Location: [place] [facility];  Service: Cardiovascular;  Laterality: N/A;  . PARS PLANA VITRECTOMY Right 09/22/2019   Procedure: PARS PLANA VITRECTOMY WITH 25 GAUGE RIGHT EYE;  Surgeon: Sherlynn Stalls, MD;  Location: [place];  Service: Ophthalmology;  Laterality: Right;  . PHOTOCOAGULATION WITH LASER Right 09/22/2019   Procedure: PHOTOCOAGULATION WITH LASER RIGHT EYE;  Surgeon: Sherlynn Stalls, MD;  Location: [facility];  Service: Ophthalmology;  Laterality: Right;  . POLYPECTOMY  04/28/2019   Procedure:  POLYPECTOMY;  Surgeon: Milus Banister, MD;  [facility];  Service: Endoscopy;;  . RIGHT HEART CATHETERIZATION Right 06/06/2014   Procedure:  RIGHT HEART CATH;  Surgeon: Jolaine Artist, MD;  Location: [facility] C[facility] LAB;  Service: Cardiovascular;  Laterality: Right;  . UVULOPALATOPHARYNGOPLASTY  1999    Family History: Family History  Problem Relation Age of Onset  . Heart disease Mother   . Heart attack Mother   . Hypertension Mother   . Kidney failure Mother   . Liver disease Mother        stage 4 liver disease  . Prostate cancer Father   . Cancer Father        lung  . Hypertension Brother   . Gout Brother   . Alcoholism Maternal Grandfather   . Liver disease Maternal Grandfather   . Migraines Sister   . Hypertension Brother   . Colon cancer Neg Hx     Social History: Social History   Socioeconomic History  . Marital status: Divorced    Spouse name: Not on file  . Number of children: 3  . Years of education: Not on file  . Highest education level: Not on file  Occupational History  . Occupation: Retired    Comment: Social research officer, government  Tobacco Use  . Smoking status: Former Smoker    Packs/day: 2.00    Years: 5.00    Pack years: 10.00    Types: Cigarettes    Quit date: 09/22/1979    Years since quitting: 40.3  . Smokeless tobacco: Never Used  Substance and Sexual Activity  . Alcohol use: Yes    Alcohol/week: 6.0 standard drinks    Types: 6 Shots of liquor per week    Comment: social drinker  . Drug use: No  . Sexual activity: Yes    Birth control/protection: Spermicide  Other Topics Concern  . Not on file  Social History Narrative   0 caffeine drinks daily    Social Determinants of Health   Financial Resource Strain: Low Risk   . Difficulty of Paying Living Expenses: Not hard at all  Food Insecurity: No Food Insecurity  . Worried About Charity fundraiser in the Last Year: Never true  . Ran Out of Food in the Last Year: Never true  Transportation  Needs: No Transportation Needs  . Lack of Transportation (Medical): No  . Lack of Transportation (Non-Medical): No  Physical Activity: Inactive  . Days of Exercise per Week: 0 days  . Minutes of Exercise per Session: 0 min  Stress: Stress Concern Present  . Feeling of Stress : Rather much  Social Connections: Slightly Isolated  . Frequency of Communication with Friends and Family: More than three times a week  . Frequency of Social Gatherings with Friends and Family: Three times a week  . Attends Religious Services: More than 4 times per year  . Active Member of Clubs or Organizations: Yes  . Attends Archivist Meetings: More than 4 times per year  . Marital Status: Divorced    Allergies:  Allergies  Allergen Reactions  . Aspirin Hypertension  . Nitroglycerin Other (See Comments)    Severe Headache    Objective:    Vital Signs:   Temp:  [98.7 F (37.1 C)] 98.7 F (37.1 C) (03/29 1557) Pulse Rate:  [79-106] 86 (03/30 0915) Resp:  [15-28] 16 (03/30 0915) BP: (91-156)/(52-71) 91/52 (03/30 0915) SpO2:  [95 %-100 %] 100 % (03/30 0915) Weight:  [99.3 kg] 99.3 kg (03/29 1548)    Weight change: Filed Weights   01/30/20 1548  Weight: 99.3 kg    Intake/Output:  Intake/Output Summary (Last 24 hours) at 01/31/2020 1104 Last data filed at 01/31/2020 0103 Gross per 24 hour  Intake 500 ml  Output --  Net 500 ml      Physical Exam    General:  No resp difficulty HEENT: normal Neck: supple. JVP to jaw  . Carotids 2+ bilat; no bruits. No lymphadenopathy or thyromegaly appreciated. Cor: PMI nondisplaced. Regular rate & rhythm. No rubs, gallops or murmurs. Lungs: clear Abdomen: soft, nontender, nondistended. No hepatosplenomegaly. No bruits or masses. Good bowel sounds. Extremities: no cyanosis, clubbing, rash, R and LLE 2-3+  Edema. RLE medical aspect indurated area.  Neuro: alert & orientedx3, cranial nerves grossly intact. moves all 4 extremities w/o  difficulty. Affect pleasant   Telemetry   SR/ST 90-100s   EKG    Sinus Tach 105 bpm   Labs   Basic Metabolic Panel: Recent Labs  Lab 01/25/20 0439 01/25/20 0439 01/26/20 0443 01/30/20 1605 01/31/20 0337  NA 137  --  137 134* 134*  K 5.1  --  4.4 4.8 4.6  CL 107  --  106 105 105  CO2 22  --  23 13* 15*  GLUCOSE 137*  --  91 116* 96  BUN 50*  --  46* 57* 68*  CREATININE 2.90*  --  2.71* 4.54* 4.41*  CALCIUM 8.6*   < > 8.5* 8.5* 8.0*  MG 2.2  --  2.3  --   --   PHOS 4.4  --  3.3  --   --    < > = values in this interval not displayed.    Liver Function Tests: Recent Labs  Lab 01/25/20 0439 01/26/20 0443 01/30/20 1605 01/31/20 0337  AST  --   --  57* 42*  ALT  --   --  25 19  ALKPHOS  --   --  293* 217*  BILITOT  --   --  4.4* 3.0*  PROT  --   --  7.7 6.0*  ALBUMIN 2.7* 2.6* 3.1* 2.5*   No results for input(s): LIPASE, AMYLASE in the last 168 hours. No results for input(s): AMMONIA in the last 168 hours.  CBC: Recent Labs  Lab 01/25/20 0439 01/26/20 0443 01/30/20 1605 01/31/20 0337  WBC 2.5* 3.2* 17.9* 10.7*  NEUTROABS  --   --  15.1* 8.8*  HGB 8.7* 8.4* 11.6* 9.9*  HCT 28.2* 27.7* 38.3* 32.3*  MCV 105.2* 106.1* 107.9* 108.0*  PLT 26* 26* 32* 20*    Cardiac Enzymes: No results for input(s): CKTOTAL, CKMB, CKMBINDEX, TROPONINI in the last 168 hours.  BNP: BNP (last 3 results) Recent Labs    05/16/19 1051 01/20/20 1504 01/30/20 1605  BNP 1,119.2* 506.0* >4,500.0*    ProBNP (last 3 results) Recent Labs    10/11/19 1138 12/08/19 1041 01/11/20 1439  PROBNP 604.0* 710.0* 494.0*     CBG: No results for input(s): GLUCAP in the last 168 hours.  Coagulation Studies: No results for input(s): LABPROT, INR in the last 72 hours.   Imaging   DG Chest 2 View  Result Date: 01/30/2020 CLINICAL DATA:  Shortness of breath. Leg swelling for 1 day. EXAM: CHEST - 2 VIEW COMPARISON:  One-view chest x-ray 01/20/2020 FINDINGS: The heart is  enlarged. Loop recorder is in place. Atherosclerotic changes are noted at the aortic arch. New interstitial and airspace opacities are present in the periphery of both lungs. Right greater than left basilar airspace opacities are present. A right pleural effusion is suspected. IMPRESSION: 1.  Cardiomegaly without evident edema. 2. Patchy peripheral airspace opacities raises concern for infection. 3. Right greater than left basilar opacity and right pleural effusion is also concerning for infection versus is less likely atelectasis. Electronically Signed   By: San Morelle M.D.   On: 01/30/2020 16:42      Medications:     Current Medications: . Ferrous Fumarate  1 tablet Oral Daily  . folic acid  1 mg Oral Daily  . heparin  5,000 Units Subcutaneous Q8H  . macitentan  10 mg Oral Daily  . pantoprazole  40 mg Oral Daily  . rosuvastatin  5 mg Oral q1800  . Selexipag  1,000 mcg Oral BID  . sodium chloride flush  3 mL Intravenous Q12H  . tadalafil (PAH)  40 mg Oral Daily  . thiamine  100 mg Oral Daily  . vancomycin variable dose per unstable renal function (pharmacist dosing)   Does not apply See admin instructions     Infusions: . sodium chloride    . ceFEPime (MAXIPIME) IV Stopped (01/30/20 2341)        Assessment/Plan   1. Cellulitis -WBC 17 on admit. Possible source RLE  -Blood Cx obtained 3/29. - On vancomycin  2. A/C Diastolic Heart Failure   ECHO 01/2020 EF 60-65% Grade II DD -Marked volume overload. Start 80 mg IV lasix twice daily.  - Hopefully renal function will improve with diuresis.  - May need to add milrinone to support RV.   3 PAH: Suspected portopulmonary HTN in setting of ETOH cirrhosis. On triple therapy uptravi 1000 mcg twice a day, opsumit 10 mg daily, and tadalafil 40 mg daily. Continue home [place] meds.   4 AKI CKD Stage IIIB  Creatinine on 3/25 was 2.7. Todays creatinine is 4.54.  5. Cirrhosis  We will follow closely.     Length of Stay:  1  Amy Clegg, NP  01/31/2020, 11:04 AM  Advanced Heart Failure Team Pager [phone] (M-F; 7a - 4p)  Please contact [facility] for night-coverage after hours (4p -7a ) and weekends on [email or web address]  Patient seen and examined with the above-signed Advanced Practice Provider and/or Housestaff. I personally reviewed laboratory data, imaging studies and relevant notes. I independently examined the patient and formulated the important aspects of the plan. I have edited the note to reflect any of my changes or salient points. I have personally discussed the plan with the patient and/or family.  Patient well known to me from [facility]. Recently admitted with severe RV failure and volume overload. Now back with recurrent volume overload, sepsis duet o RLE cellulitis and AKI.   He appears ill and weak  HEENT: normal Neck: supple. JVP to jaw  Carotids 2+ bilat; no bruits. No lymphadenopathy or thryomegaly appreciated. Cor: PMI nondisplaced. Regular rate & rhythm.2/6 TR + RV lift Lungs: clear Abdomen: soft, nontender, nondistended. No hepatosplenomegaly. No bruits or masses. Good bowel sounds. Extremities: no cyanosis, clubbing, rash, 2-3+ edema with area of infection on RLE (I marked this)  Neuro: alert & orientedx3, cranial nerves grossly intact. moves all 4 extremities w/o difficulty. Affect pleasant  Agree with IV abx for cellulitis/sepsis. Cultures remain negative. Would start IV lasix. I again encouraged him to decrease his fluid intake but he has been really poor at self care for quite some time. Hopefully creatinine will improve as he stabilizes. Also has chronic, severe pancytopenia which is though to be related to portopulmonary HTN and splenic sequestration.   Likely time to begin Hospice discussions.  Glori Bickers, MD  4:04 PM

## 2020-01-31 NOTE — Progress Notes (Signed)
PROGRESS NOTE    TAREK CRAVENS  IPJ:[id number] DOB: [birth year] DOA: 01/30/2020 PCP: Mosie Lukes, MD   Brief Narrative:   Darren Mueller  is a 69 y.o. male, with history of severe pulmonary hypertension, alcoholic cirrhosis, esophageal varices, minimal CAD on right/left heart cath, CKD stage IIIa, hypertension, COPD presented to the ED with complaints of shortness of breath and worsening leg swelling for past 3 days.  Patient was discharged from the hospital on 01/26/2020 after he was treated for acute on chronic diastolic/RV failure.  At that time patient was treated with IV Lasix.  Patient weight had dropped to 199 pounds. As per patient he started getting worsening shortness of breath for past 3 days.  He denies fever but complains of chills.  Denies coughing up any phlegm.  Denies nausea vomiting or diarrhea.  Denies constipation.  No abdominal pain.  Patient's weight today is 219 pounds, BNP >4500, trop 377 then 414, WBC 17.9  Assessment & Plan:   Principal Problem:   Acute exacerbation of CHF (congestive heart failure) (HCC) Active Problems:   Hypertension   Alcohol dependence ([place])   GERD (gastroesophageal reflux disease)   Hypothyroidism   Chronic renal insufficiency, stage III (moderate) (HCC)   Cirrhosis with alcoholism ([place])   Hyperlipidemia   COPD (chronic obstructive pulmonary disease) (HCC)  Acute exacerbation CHF:  -weight up 20 pounds from discharge -cardiology to see today, last night recommended no diuresis based on low BP, BP 130/82 when I saw him today may be able to tolerate some lasix today -treating for potential HCAP as well unclear on CXR may just be fluid  Potential HCAP/infection: -CXR from ER with question of pneumonia, given recent discharge needs HCAP coverage -cefepime and vanc dosing per pharmacy to adjust for renal function -cardiology with concern for cellulitis lower extremity however it is less clearly cellulitic on my exam after first  antibiotic dosing, this is covered by vancomycin, will monitor closely  AKI on CKD 3-4:  -Cr 4.5 on admission with GFR around 15, was 2.5 during recent admission with GFR closer to 30 -likely needs diuresis to help improve renal function -making very little urine at this time  GERD:  -protonix 40 mg daily  Hyperlipidemia: -crestor 5 mg daily  Pulmonary hypertension -opsumit, selexipag, adcirca  Alcohol dependence -thiamine and folic acid -does not admit to recent usage  Hypothyroidism: -on problem list but no levothyroxine on home med list -checking TSH as last July 2020  Cirrhosis: -monitor LFTs and fluid status -no current abdominal pain or ascites  DVT prophylaxis: Heparin Code Status: full Family Communication: patient only Disposition Plan:  . Patient came from: home            . Anticipated d/c place:home . Barriers to d/c OR conditions which need to be met to effect a safe d/c: up 20 pounds and needs diuresis for SOB  Consultants:   Cardiology (heart failure)  Procedures:  none  Antimicrobials:   Cefepime  Vancomycin  Subjective: Feeling poorly, breathing is rough. He is not having chest pain. Denies cough in the last week. SOB came on fairly suddenly about 1-2 days prior to admission. A little better with sitting propped up. Denies abdominal pain. Some loose stools in the last few days. Feels legs are still very swollen but better than when arriving to hospital. Not making almost any urine since arrival to ER.   Objective: Vitals:   01/31/20 0830 01/31/20 0845 01/31/20 0900 01/31/20 0915  BP:  91/70 (!) 98/59 (!) 106/55 (!) 91/52  Pulse: 88 83 84 86  Resp: (!) 24   16  Temp:      TempSrc:      SpO2: 99% 100% 99% 100%  Weight:      Height:        Intake/Output Summary (Last 24 hours) at 01/31/2020 1152 Last data filed at 01/31/2020 0103 Gross per 24 hour  Intake 500 ml  Output -  Net 500 ml   Filed Weights   01/30/20 1548  Weight: 99.3 kg    Examination:  General exam: Appears calm and vaguely comfortable  Respiratory system: Some rales and wheeze bilateral. Respiratory effort normal. Some SOB with talking for extended period Cardiovascular system: S1 & S2 heard, RRR.  Gastrointestinal system: Abdomen is nondistended, soft and nontender. No organomegaly or masses felt. Normal bowel sounds heard. Central nervous system: Alert and oriented. No focal neurological deficits. Extremities: Symmetric 5 x 5 power. Skin: some rash versus venous stasis color change on right lower leg, 2+ swelling bilateral to thighs Psychiatry: Judgement and insight appear normal. Mood & affect appropriate.   Data Reviewed: I have personally reviewed following labs and imaging studies  CBC: Recent Labs  Lab 01/25/20 0439 01/26/20 0443 01/30/20 1605 01/31/20 0337  WBC 2.5* 3.2* 17.9* 10.7*  NEUTROABS  --   --  15.1* 8.8*  HGB 8.7* 8.4* 11.6* 9.9*  HCT 28.2* 27.7* 38.3* 32.3*  MCV 105.2* 106.1* 107.9* 108.0*  PLT 26* 26* 32* 20*   Basic Metabolic Panel: Recent Labs  Lab 01/25/20 0439 01/26/20 0443 01/30/20 1605 01/31/20 0337  NA 137 137 134* 134*  K 5.1 4.4 4.8 4.6  CL 107 106 105 105  CO2 22 23 13* 15*  GLUCOSE 137* 91 116* 96  BUN 50* 46* 57* 68*  CREATININE 2.90* 2.71* 4.54* 4.41*  CALCIUM 8.6* 8.5* 8.5* 8.0*  MG 2.2 2.3  --   --   PHOS 4.4 3.3  --   --    GFR: Estimated Creatinine Clearance: 19.6 mL/min (A) (by C-G formula based on SCr of 4.41 mg/dL (H)). Liver Function Tests: Recent Labs  Lab 01/25/20 0439 01/26/20 0443 01/30/20 1605 01/31/20 0337  AST  --   --  57* 42*  ALT  --   --  25 19  ALKPHOS  --   --  293* 217*  BILITOT  --   --  4.4* 3.0*  PROT  --   --  7.7 6.0*  ALBUMIN 2.7* 2.6* 3.1* 2.5*   No results for input(s): LIPASE, AMYLASE in the last 168 hours. No results for input(s): AMMONIA in the last 168 hours. Coagulation Profile: No results for input(s): INR, PROTIME in the last 168 hours. Cardiac  Enzymes: No results for input(s): CKTOTAL, CKMB, CKMBINDEX, TROPONINI in the last 168 hours. BNP (last 3 results) Recent Labs    10/11/19 1138 12/08/19 1041 01/11/20 1439  PROBNP 604.0* 710.0* 494.0*   HbA1C: No results for input(s): HGBA1C in the last 72 hours. CBG: No results for input(s): GLUCAP in the last 168 hours. Lipid Profile: No results for input(s): CHOL, HDL, LDLCALC, TRIG, CHOLHDL, LDLDIRECT in the last 72 hours. Thyroid Function Tests: No results for input(s): TSH, T4TOTAL, FREET4, T3FREE, THYROIDAB in the last 72 hours. Anemia Panel: No results for input(s): VITAMINB12, FOLATE, FERRITIN, TIBC, IRON, RETICCTPCT in the last 72 hours. Sepsis Labs: No results for input(s): PROCALCITON, LATICACIDVEN in the last 168 hours.  Recent Results (from the past  240 hour(s))  SARS CORONAVIRUS 2 (TAT 6-24 HRS) Nasopharyngeal Nasopharyngeal Swab     Status: None   Collection Time: 01/30/20  8:59 PM   Specimen: Nasopharyngeal Swab  Result Value Ref Range Status   SARS Coronavirus 2 NEGATIVE NEGATIVE Final    Comment: (NOTE) SARS-CoV-2 target nucleic acids are NOT DETECTED. The SARS-CoV-2 RNA is generally detectable in upper and lower respiratory specimens during the acute phase of infection. Negative results do not preclude SARS-CoV-2 infection, do not rule out co-infections with other pathogens, and should not be used as the sole basis for treatment or other patient management decisions. Negative results must be combined with clinical observations, patient history, and epidemiological information. The expected result is Negative. Fact Sheet for Patients: SugarRoll.be Fact Sheet for Healthcare Providers: https://www.woods-mathews.com/ This test is not yet approved or cleared by the [place] FDA and  has been authorized for detection and/or diagnosis of SARS-CoV-2 by FDA under an Emergency Use Authorization (EUA). This EUA will  remain  in effect (meaning this test can be used) for the duration of the COVID-19 declaration under Section 56 4(b)(1) of the Act, 21 U.S.C. section 360bbb-3(b)(1), unless the authorization is terminated or revoked sooner. Performed at [facility], [place] [address], [place], [place] [address]   Culture, blood (Routine X 2) w Reflex to ID Panel     Status: None (Preliminary result)   Collection Time: 01/30/20  9:25 PM   Specimen: BLOOD  Result Value Ref Range Status   Specimen Description BLOOD LEFT UPPER ARM  Final   Special Requests   Final    BOTTLES DRAWN AEROBIC AND ANAEROBIC Blood Culture adequate volume   Culture   Final    NO GROWTH < 12 HOURS Performed at [facility], [place] [address] [place], [place] [address]    Report Status PENDING  Incomplete    Radiology Studies: DG Chest 2 View  Result Date: 01/30/2020 CLINICAL DATA:  Shortness of breath. Leg swelling for 1 day. EXAM: CHEST - 2 VIEW COMPARISON:  One-view chest x-ray 01/20/2020 FINDINGS: The heart is enlarged. Loop recorder is in place. Atherosclerotic changes are noted at the aortic arch. New interstitial and airspace opacities are present in the periphery of both lungs. Right greater than left basilar airspace opacities are present. A right pleural effusion is suspected. IMPRESSION: 1. Cardiomegaly without evident edema. 2. Patchy peripheral airspace opacities raises concern for infection. 3. Right greater than left basilar opacity and right pleural effusion is also concerning for infection versus is less likely atelectasis. Electronically Signed   By: [place] Morelle M.D.   On: 01/30/2020 16:42   Scheduled Meds: . Ferrous Fumarate  1 tablet Oral Daily  . folic acid  1 mg Oral Daily  . heparin  5,000 Units Subcutaneous Q8H  . macitentan  10 mg Oral Daily  . pantoprazole  40 mg Oral Daily  . rosuvastatin  5 mg Oral q1800  . Selexipag  1,000 mcg Oral BID  . sodium chloride flush  3 mL Intravenous Q12H   . tadalafil (PAH)  40 mg Oral Daily  . thiamine  100 mg Oral Daily  . vancomycin variable dose per unstable renal function (pharmacist dosing)   Does not apply See admin instructions   Continuous Infusions: . sodium chloride    . ceFEPime (MAXIPIME) IV Stopped (01/30/20 2341)    LOS: 1 day   Time spent: 25  Hoyt Koch, MD [facility]  To contact the  attending provider between 7A-7P or the covering provider during after hours 7P-7A, please log into the web site [email or web address] and access using universal [place] password for that web site. If you do not have the password, please call the hospital operator.  01/31/2020, 11:52 AM

## 2020-02-01 DIAGNOSIS — L03115 Cellulitis of right lower limb: Secondary | ICD-10-CM

## 2020-02-01 LAB — VANCOMYCIN, RANDOM: Vancomycin Rm: 15

## 2020-02-01 LAB — CBC
HCT: 31.3 % — ABNORMAL LOW (ref 39.0–52.0)
Hemoglobin: 9.6 g/dL — ABNORMAL LOW (ref 13.0–17.0)
MCH: 33.1 pg (ref 26.0–34.0)
MCHC: 30.7 g/dL (ref 30.0–36.0)
MCV: 107.9 fL — ABNORMAL HIGH (ref 80.0–100.0)
Platelets: 20 10*3/uL — CL (ref 150–400)
RBC: 2.9 MIL/uL — ABNORMAL LOW (ref 4.22–5.81)
RDW: 20.1 % — ABNORMAL HIGH (ref 11.5–15.5)
WBC: 7.5 10*3/uL (ref 4.0–10.5)
nRBC: 0 % (ref 0.0–0.2)

## 2020-02-01 LAB — RENAL FUNCTION PANEL
Albumin: 2.4 g/dL — ABNORMAL LOW (ref 3.5–5.0)
Anion gap: 13 (ref 5–15)
BUN: 73 mg/dL — ABNORMAL HIGH (ref 8–23)
CO2: 16 mmol/L — ABNORMAL LOW (ref 22–32)
Calcium: 7.8 mg/dL — ABNORMAL LOW (ref 8.9–10.3)
Chloride: 103 mmol/L (ref 98–111)
Creatinine, Ser: 3.73 mg/dL — ABNORMAL HIGH (ref 0.61–1.24)
GFR calc Af Amer: 18 mL/min — ABNORMAL LOW (ref >60)
GFR calc non Af Amer: 16 mL/min — ABNORMAL LOW (ref >60)
Glucose, Bld: 126 mg/dL — ABNORMAL HIGH (ref 70–99)
Phosphorus: 4 mg/dL (ref 2.5–4.6)
Potassium: 4 mmol/L (ref 3.5–5.1)
Sodium: 132 mmol/L — ABNORMAL LOW (ref 135–145)

## 2020-02-01 LAB — CULTURE, BLOOD (ROUTINE X 2): Special Requests: ADEQUATE

## 2020-02-01 LAB — TSH: TSH: 1.805 u[IU]/mL (ref 0.350–4.500)

## 2020-02-01 MED ORDER — RISAQUAD PO CAPS
1.0000 | ORAL_CAPSULE | Freq: Every day | ORAL | Status: DC
  Administered 2020-02-01 – 2020-02-05 (×5): 1 via ORAL
  Filled 2020-02-01 (×5): qty 1

## 2020-02-01 MED ORDER — TRAMADOL HCL 50 MG PO TABS
50.0000 mg | ORAL_TABLET | Freq: Once | ORAL | Status: AC
  Administered 2020-02-01: 50 mg via ORAL
  Filled 2020-02-01: qty 1

## 2020-02-01 MED ORDER — LOPERAMIDE HCL 2 MG PO CAPS: 2.0000 mg | ORAL_CAPSULE | ORAL | Status: DC | PRN

## 2020-02-01 MED ORDER — VANCOMYCIN HCL IN DEXTROSE 1-5 GM/200ML-% IV SOLN
1000.0000 mg | Freq: Once | INTRAVENOUS | Status: AC
  Administered 2020-02-01: 1000 mg via INTRAVENOUS
  Filled 2020-02-01: qty 200

## 2020-02-01 MED ORDER — ACETAMINOPHEN 325 MG PO TABS
650.0000 mg | ORAL_TABLET | Freq: Four times a day (QID) | ORAL | Status: DC | PRN
  Administered 2020-02-01 – 2020-02-03 (×4): 650 mg via ORAL
  Filled 2020-02-01 (×4): qty 2

## 2020-02-01 NOTE — Progress Notes (Signed)
Called pharmacy for advice about patient SQ heparin with very low platelet count.  Pharmacist suggested to ask provider to order another VTE.  APP notified.  Orders placed for SCD's but heparin order still active.  Will notify next shift.    2300-pt does not want to use SCD's due to tenderness and swelling in legs.

## 2020-02-01 NOTE — Progress Notes (Addendum)
Advanced Heart Failure Rounding Note  PCP-Cardiologist: Glori Bickers, MD   Subjective:    Yesterday diuresed with IV lasix. I/O not accurate.   Weight down from 219 home weight--> 196   Denies SOB.    Objective:   Weight Range: 89.3 kg Body mass index is 25.98 kg/m.   Vital Signs:   Temp:  [97.7 F (36.5 C)-98.6 F (37 C)] 97.9 F (36.6 C) (03/31 0346) Pulse Rate:  [76-88] 77 (03/31 0346) Resp:  [15-32] 16 (03/31 0346) BP: (97-130)/(59-82) 108/67 (03/31 0346) SpO2:  [98 %-100 %] 100 % (03/31 0346) Weight:  [89.3 kg] 89.3 kg (03/31 0338) Last BM Date: 01/31/20  Weight change: Filed Weights   01/30/20 1548 02/01/20 0338  Weight: 99.3 kg 89.3 kg    Intake/Output:   Intake/Output Summary (Last 24 hours) at 02/01/2020 1027 Last data filed at 02/01/2020 0917 Gross per 24 hour  Intake 925.3 ml  Output 2295 ml  Net -1369.7 ml      Physical Exam    General:  No resp difficulty HEENT: Normal Neck: Supple. JVP jaw . Carotids 2+ bilat; no bruits. No lymphadenopathy or thyromegaly appreciated. Cor: PMI nondisplaced. Regular rate & rhythm. No rubs, gallops or murmurs. Lungs: Clear Abdomen: Soft, nontender, nondistended. No hepatosplenomegaly. No bruits or masses. Good bowel sounds. Extremities: No cyanosis, clubbing, rash, LLE and RLE  1-2+ edema R>L Neuro: Alert & orientedx3, cranial nerves grossly intact. moves all 4 extremities w/o difficulty. Affect pleasant   Telemetry   NSR 70-80s   EKG    n/a  Labs    CBC Recent Labs    01/30/20 1605 01/30/20 1605 01/31/20 0337 02/01/20 0442  WBC 17.9*   < > 10.7* 7.5  NEUTROABS 15.1*  --  8.8*  --   HGB 11.6*   < > 9.9* 9.6*  HCT 38.3*   < > 32.3* 31.3*  MCV 107.9*   < > 108.0* 107.9*  PLT 32*   < > 20* 20*   < > = values in this interval not displayed.   Basic Metabolic Panel Recent Labs    01/31/20 0337 02/01/20 0442  NA 134* 132*  K 4.6 4.0  CL 105 103  CO2 15* 16*  GLUCOSE 96 126*    BUN 68* 73*  CREATININE 4.41* 3.73*  CALCIUM 8.0* 7.8*  PHOS  --  4.0   Liver Function Tests Recent Labs    01/30/20 1605 01/30/20 1605 01/31/20 0337 02/01/20 0442  AST 57*  --  42*  --   ALT 25  --  19  --   ALKPHOS 293*  --  217*  --   BILITOT 4.4*  --  3.0*  --   PROT 7.7  --  6.0*  --   ALBUMIN 3.1*   < > 2.5* 2.4*   < > = values in this interval not displayed.   No results for input(s): LIPASE, AMYLASE in the last 72 hours. Cardiac Enzymes No results for input(s): CKTOTAL, CKMB, CKMBINDEX, TROPONINI in the last 72 hours.  BNP: BNP (last 3 results) Recent Labs    05/16/19 1051 01/20/20 1504 01/30/20 1605  BNP 1,119.2* 506.0* >4,500.0*    ProBNP (last 3 results) Recent Labs    10/11/19 1138 12/08/19 1041 01/11/20 1439  PROBNP 604.0* 710.0* 494.0*     D-Dimer No results for input(s): DDIMER in the last 72 hours. Hemoglobin A1C No results for input(s): HGBA1C in the last 72 hours. Fasting Lipid Panel No  results for input(s): CHOL, HDL, LDLCALC, TRIG, CHOLHDL, LDLDIRECT in the last 72 hours. Thyroid Function Tests Recent Labs    02/01/20 0442  TSH 1.805    Other results:   Imaging     No results found.   Medications:     Scheduled Medications: . Ferrous Fumarate  1 tablet Oral Daily  . folic acid  1 mg Oral Daily  . furosemide  80 mg Intravenous BID  . macitentan  10 mg Oral Daily  . pantoprazole  40 mg Oral Daily  . rosuvastatin  5 mg Oral q1800  . Selexipag  1,000 mcg Oral BID  . sodium chloride flush  3 mL Intravenous Q12H  . tadalafil (PAH)  40 mg Oral Daily  . thiamine  100 mg Oral Daily  . vancomycin variable dose per unstable renal function (pharmacist dosing)   Does not apply See admin instructions     Infusions: . sodium chloride 250 mL (01/31/20 2108)  . ceFEPime (MAXIPIME) IV 2 g (01/31/20 2110)     PRN Medications:  sodium chloride, ondansetron **OR** ondansetron (ZOFRAN) IV, sodium chloride  flush     Assessment/Plan  1. Cellulitis -WBC 17 on admit. Possible source RLE  - WBC down to 7.5  -Blood Cx obtained 3/29. - Continue vancomycin - Add ace wrap to RLE  2. A/C Diastolic Heart Failure   ECHO 01/2020 EF 60-65% Grade II DD -Volume status improving with IV lasix . Continue  80 mg IV lasix twice daily. Dry weight 192 pounds.  - Renal function improving.   - May need to add milrinone to support RV.   3 PAH: Suspected portopulmonary HTN in setting of ETOH cirrhosis. On triple therapy uptravi 1000 mcg twice a day, opsumit 10 mg daily, and tadalafil 40 mg daily. Continue home [place] meds.   4 AKI CKD Stage IIIB  Creatinine on 3/25 was 2.7. Creatinine on admit 4.54---> 3.7   5. Cirrhosis  6. Thrombocytopenia Platelets 20> 20>32    Length of Stay: 2  Amy Clegg, NP  02/01/2020, 10:27 AM  Advanced Heart Failure Team Pager [phone] (M-F; 7a - 4p)  Please contact [facility] for night-coverage after hours (4p -7a ) and weekends on [email or web address]  Patient seen and examined with the above-signed Advanced Practice Provider and/or Housestaff. I personally reviewed laboratory data, imaging studies and relevant notes. I independently examined the patient and formulated the important aspects of the plan. I have edited the note to reflect any of my changes or salient points. I have personally discussed the plan with the patient and/or family.  Sepsis improving. RLE infection looks better but still quite edematous. Looks like lymphedema   Overall volume status getting better as well but remains volume overloaded. Creatinine improved. Platelets down to 20K in setting of sepsis. No bleeding. [place] c/w CNS (contaminant)  General:  Sitting up in bed. No resp difficulty HEENT: normal Neck: supple. JVP to jaw Carotids 2+ bilat; no bruits. No lymphadenopathy or thryomegaly appreciated. Cor: PMI nondisplaced. Regular rate & rhythm. 2/6 TR + prominent P2.  Lungs: clear Abdomen:  soft, nontender, nondistended. No hepatosplenomegaly. No bruits or masses. Good bowel sounds. Extremities: no cyanosis, clubbing, rash, 3+ edema on right with area of cellulitis 1-2+ on L Neuro: alert & orientedx3, cranial nerves grossly intact. moves all 4 extremities w/o difficulty. Affect pleasant  Continue IV diuresis. Continue vanc for sepsis/cellulitis. Need to wrap and elevated RLE.   Platelets remain low. Hopefully will get back to baseline with  treatment of sepsis.   Glori Bickers, MD  4:54 PM

## 2020-02-01 NOTE — Progress Notes (Signed)
Pharmacy Antibiotic Note  Darren Mueller is a 69 y.o. male admitted on 01/30/2020 with pneumonia.  Pharmacy has been consulted for cefepime and vancomycin dosing.  CKD3 with SCr 4.54 (>2x baseline) - now down to 3.73.   Vancomycin random came back therapeutic at 15. Last dose of vancomycin on 3/29. WBC WNL, afebrile.   Plan: Vancomycin 1000 mg IV x 1, then continue variable dosing based on renal function Cefepime 2g IV every 24 hours Add MRSA PCR Monitor renal function, PCR to narrow Vancomycin random level as needed - will plan for 4/2 AM unless significant improvement   Height: _0  (185.4 cm) Weight: 196 lb 14.4 oz (89.3 kg) IBW/kg (Calculated) : 79.9  Temp (24hrs), Avg:97.9 F (36.6 C), Min:97.5 F (36.4 C), Max:98.6 F (37 C)  Recent Labs  Lab 01/26/20 0443 01/30/20 1605 01/31/20 0337 02/01/20 0442  WBC 3.2* 17.9* 10.7* 7.5  CREATININE 2.71* 4.54* 4.41* 3.73*  VANCORANDOM  --   --   --  15    Estimated Creatinine Clearance: 21.1 mL/min (A) (by C-G formula based on SCr of 3.73 mg/dL (H)).    Allergies  Allergen Reactions  . Aspirin Hypertension  . Nitroglycerin Other (See Comments)    Severe Headache    Antimicrobials this admission: Vanc 3/29>> Cefepime 3/29>>  Dose adjustments this admission: N/A  Microbiology results: BCx 3/29: coag neg staph 1/2  Antonietta Jewel, PharmD, BCCCP Clinical Pharmacist  Phone: [phone]  Please check AMION for all [place] phone numbers After 10:00 PM, call [place] [phone] 02/01/2020 1:59 PM

## 2020-02-01 NOTE — Progress Notes (Signed)
Paged [facility] at [phone] Critical lab value: Platelets 20. Notified by lab at 0853.  Paged [facility] at [phone]. 9/10 frontal HA aching pain. no PRN pain meds available. add pain med? PRN Tylenol added.  Paged [facility] at 1753 about loose stool, nause and episode of emesis and upset stomach. Probiotic? Order for acidophilus placed.  Paged [facility] [phone]. BP 99/56 MAP 69, HR 73. Any parameters for BP? Upon callback informed to monitor for symptomatic hypotension and notify MD if BP systolic in 82'K; night RN informed.

## 2020-02-01 NOTE — Progress Notes (Addendum)
1044 patient declined bed alarm; educated patient on fall safety and calling for assistance with toileting.  RN encouraged and stressed importance of patient elevating legs given edema. Suggested patient sit in recliner and/or elevate legs on bed.

## 2020-02-01 NOTE — Progress Notes (Addendum)
PROGRESS NOTE    Darren Mueller  KVQ:[id number] DOB: 24-Nov-1950 DOA: 01/30/2020 PCP: Mosie Lukes, MD   Brief Narrative:  Darren Mueller,with history of severe pulmonary hypertension, alcoholic cirrhosis, esophageal varices, minimal CAD on right/left heart cath, CKD stage IIIa, hypertension, COPD presented to the ED with complaints of shortness of breath and worsening leg swelling for past 3 days. Patient was discharged from the hospital on 01/26/2020 after he was treated for acute on chronic diastolic/RV failure. At that time patient was treated with IV Lasix. Patient weight had dropped to 199 pounds. As per patient he started getting worsening shortness of breath for past 3 days. He denies fever but complains of chills. Denies coughing up any phlegm. Denies nausea vomiting or diarrhea. Denies constipation. No abdominal pain. Patient's weight today is 219 pounds, BNP >4500, trop 377 then 414, WBC 17.9  Assessment & Plan:   Principal Problem:   Acute exacerbation of CHF (congestive heart failure) (HCC) Active Problems:   Hypertension   Alcohol dependence (HCC)   GERD (gastroesophageal reflux disease)   Hypothyroidism   Chronic renal insufficiency, stage III (moderate) (HCC)   Cirrhosis with alcoholism (HCC)   Hyperlipidemia   COPD (chronic obstructive pulmonary disease) (HCC)  Acute exacerbation CHF:  -weight up 20 pounds from discharge -diuresing well with lasix 80 mg IV BID -treating for potential HCAP as well unclear on CXR may just be fluid -plan to recheck CXR tomorrow to clarify  Potential HCAP/infection with sepsis at admission: -CXR from ER with question of pneumonia, given recent discharge needs HCAP coverage, low BP and high HR on admission -repeat CXR on 02/02/20 with 2 days of diuresis -cefepime and vanc dosing per pharmacy to adjust for renal function -concern for cellulitis right lower extremity which is adequately treated with  vancomycin at this time clinically leg is improving from yesterday -blood cultures pending with 1/2 gram positive cocci may be contaminant will await speciation, adequately covered at this time will need repeat blood cultures if thought to be real infection  AKI on CKD 3-4:  -Cr improving to 3.7 today 4.5 on admission with GFR around 15, was 2.5 during recent admission with GFR closer to 30 -likely needs ongoing diuresis to help improve renal function  GERD:  -protonix 40 mg daily  Hyperlipidemia: -crestor 5 mg daily  Pulmonary hypertension -opsumit, selexipag, adcirca  Alcohol dependence -thiamine and folic acid -does not admit to recent usage in last 2 weeks -had lengthy conversation with him today about need for no further alcohol usage as he has liver and heart failure which will both be worsened by ongoing usage and significantly shorten his life, he was understanding  Hypothyroidism: -on problem list but no levothyroxine on home med list -TSH 1.8 so not current issue  Thrombocytopenia -held heparin [place] -stable today from yesterday -likely due to splenic sequestration and cirrhosis -monitor CBC daily, no signs of current bleeding  Cirrhosis: -monitor LFTs and fluid status -does have significant thrombocytopenia -no current abdominal pain or ascites  DVT prophylaxis: SCDs Code Status: Full Family Communication: patient only Disposition Plan:  . Patient came from:home            . Anticipated d/c place:home . Barriers to d/c OR conditions which need to be met to effect a safe d/c: needs to be at dry weight prior to discharge  Consultants:   Heart failure team  Procedures:   none  Antimicrobials:   Cefepime start date 01/30/20  Vancomycin start date  01/30/20  Subjective: Breathing some better today, still SOB. Slept okay last night. Eating well this morning. Denies abdominal pain or diarrhea since yesterday. Denies abdominal pain. Still swelling in the  legs but pain only in right lower leg. Denies fevers or chills. Denies chest pains.   Objective: Vitals:   02/01/20 0338 02/01/20 0346 02/01/20 1040 02/01/20 1134  BP:  108/67 105/60 96/65  Pulse:  77 76 73  Resp:  16  18  Temp:  97.9 F (36.6 C) 97.7 F (36.5 C) (!) 97.5 F (36.4 C)  TempSrc:  Oral Oral Oral  SpO2:  100% 100% 99%  Weight: 89.3 kg     Height:        Intake/Output Summary (Last 24 hours) at 02/01/2020 1314 Last data filed at 02/01/2020 1208 Gross per 24 hour  Intake 1125.3 ml  Output 2745 ml  Net -1619.7 ml   Filed Weights   01/30/20 1548 02/01/20 0338  Weight: 99.3 kg 89.3 kg    Examination:  General exam: Appears calm and comfortable  Respiratory system: Better air movement than yesterday. Respiratory effort normal. Cardiovascular system: S1 & S2 heard. Gastrointestinal system: Abdomen is nondistended, soft and nontender. No organomegaly or masses felt. Normal bowel sounds heard. No ascites Central nervous system: Alert and oriented. No focal neurological deficits. Extremities: Symmetric 5 x 5 power. Right LE with swelling and pain with redness on the shin area medially, left LE with 1-2+ swelling less firm and no pain compared to right Skin: No rashes, lesions or ulcers Psychiatry: Judgement and insight appear normal. Mood & affect appropriate.   Data Reviewed: I have personally reviewed following labs and imaging studies  CBC: Recent Labs  Lab 01/26/20 0443 01/30/20 1605 01/31/20 0337 02/01/20 0442  WBC 3.2* 17.9* 10.7* 7.5  NEUTROABS  --  15.1* 8.8*  --   HGB 8.4* 11.6* 9.9* 9.6*  HCT 27.7* 38.3* 32.3* 31.3*  MCV 106.1* 107.9* 108.0* 107.9*  PLT 26* 32* 20* 20*   Basic Metabolic Panel: Recent Labs  Lab 01/26/20 0443 01/30/20 1605 01/31/20 0337 02/01/20 0442  NA 137 134* 134* 132*  K 4.4 4.8 4.6 4.0  CL 106 105 105 103  CO2 23 13* 15* 16*  GLUCOSE 91 116* 96 126*  BUN 46* 57* 68* 73*  CREATININE 2.71* 4.54* 4.41* 3.73*  CALCIUM  8.5* 8.5* 8.0* 7.8*  MG 2.3  --   --   --   PHOS 3.3  --   --  4.0   GFR: Estimated Creatinine Clearance: 21.1 mL/min (A) (by C-G formula based on SCr of 3.73 mg/dL (H)). Liver Function Tests: Recent Labs  Lab 01/26/20 0443 01/30/20 1605 01/31/20 0337 02/01/20 0442  AST  --  57* 42*  --   ALT  --  25 19  --   ALKPHOS  --  293* 217*  --   BILITOT  --  4.4* 3.0*  --   PROT  --  7.7 6.0*  --   ALBUMIN 2.6* 3.1* 2.5* 2.4*   No results for input(s): LIPASE, AMYLASE in the last 168 hours. No results for input(s): AMMONIA in the last 168 hours. Coagulation Profile: No results for input(s): INR, PROTIME in the last 168 hours. Cardiac Enzymes: No results for input(s): CKTOTAL, CKMB, CKMBINDEX, TROPONINI in the last 168 hours. BNP (last 3 results) Recent Labs    10/11/19 1138 12/08/19 1041 01/11/20 1439  PROBNP 604.0* 710.0* 494.0*   HbA1C: No results for input(s): HGBA1C in the  last 72 hours. CBG: No results for input(s): GLUCAP in the last 168 hours. Lipid Profile: No results for input(s): CHOL, HDL, LDLCALC, TRIG, CHOLHDL, LDLDIRECT in the last 72 hours. Thyroid Function Tests: Recent Labs    02/01/20 0442  TSH 1.805   Anemia Panel: No results for input(s): VITAMINB12, FOLATE, FERRITIN, TIBC, IRON, RETICCTPCT in the last 72 hours. Sepsis Labs: No results for input(s): PROCALCITON, LATICACIDVEN in the last 168 hours.  Recent Results (from the past 240 hour(s))  SARS CORONAVIRUS 2 (TAT 6-24 HRS) Nasopharyngeal Nasopharyngeal Swab     Status: None   Collection Time: 01/30/20  8:59 PM   Specimen: Nasopharyngeal Swab  Result Value Ref Range Status   SARS Coronavirus 2 NEGATIVE NEGATIVE Final    Comment: (NOTE) SARS-CoV-2 target nucleic acids are NOT DETECTED. The SARS-CoV-2 RNA is generally detectable in upper and lower respiratory specimens during the acute phase of infection. Negative results do not preclude SARS-CoV-2 infection, do not rule out co-infections with  other pathogens, and should not be used as the sole basis for treatment or other patient management decisions. Negative results must be combined with clinical observations, patient history, and epidemiological information. The expected result is Negative. Fact Sheet for Patients: SugarRoll.be Fact Sheet for Healthcare Providers: https://www.woods-mathews.com/ This test is not yet approved or cleared by the [place] FDA and  has been authorized for detection and/or diagnosis of SARS-CoV-2 by FDA under an Emergency Use Authorization (EUA). This EUA will remain  in effect (meaning this test can be used) for the duration of the COVID-19 declaration under Section 56 4(b)(1) of the Act, 21 U.S.C. section 360bbb-3(b)(1), unless the authorization is terminated or revoked sooner. Performed at [facility], [place] [address] [place], [place] [address]   Culture, blood (Routine X 2) w Reflex to ID Panel     Status: Abnormal   Collection Time: 01/30/20  9:25 PM   Specimen: BLOOD  Result Value Ref Range Status   Specimen Description BLOOD LEFT UPPER ARM  Final   Special Requests   Final    BOTTLES DRAWN AEROBIC AND ANAEROBIC Blood Culture adequate volume   Culture  Setup Time   Final    GRAM POSITIVE COCCI IN CLUSTERS AEROBIC BOTTLE ONLY CRITICAL RESULT CALLED TO, READ BACK BY AND VERIFIED WITHJiles Garter [facility] PHARMD 8003 01/31/20 A BROWNING    Culture (A)  Final    STAPHYLOCOCCUS SPECIES (COAGULASE NEGATIVE) THE SIGNIFICANCE OF ISOLATING THIS ORGANISM FROM A SINGLE SET OF BLOOD CULTURES WHEN MULTIPLE SETS ARE DRAWN IS UNCERTAIN. PLEASE NOTIFY THE MICROBIOLOGY DEPARTMENT WITHIN ONE WEEK IF SPECIATION AND SENSITIVITIES ARE REQUIRED. Performed at [facility], [place] [address], [place], [place] [address]    Report Status 02/01/2020 FINAL  Final    Radiology Studies: DG Chest 2 View  Result Date: 01/30/2020 CLINICAL DATA:  Shortness of  breath. Leg swelling for 1 day. EXAM: CHEST - 2 VIEW COMPARISON:  One-view chest x-ray 01/20/2020 FINDINGS: The heart is enlarged. Loop recorder is in place. Atherosclerotic changes are noted at the aortic arch. New interstitial and airspace opacities are present in the periphery of both lungs. Right greater than left basilar airspace opacities are present. A right pleural effusion is suspected. IMPRESSION: 1. Cardiomegaly without evident edema. 2. Patchy peripheral airspace opacities raises concern for infection. 3. Right greater than left basilar opacity and right pleural effusion is also concerning for infection versus is less likely atelectasis. Electronically Signed   By: Harrell Gave  Mattern M.D.   On: 01/30/2020 16:42   Scheduled Meds: . Ferrous Fumarate  1 tablet Oral Daily  . folic acid  1 mg Oral Daily  . furosemide  80 mg Intravenous BID  . macitentan  10 mg Oral Daily  . pantoprazole  40 mg Oral Daily  . rosuvastatin  5 mg Oral q1800  . Selexipag  1,000 mcg Oral BID  . sodium chloride flush  3 mL Intravenous Q12H  . tadalafil (PAH)  40 mg Oral Daily  . thiamine  100 mg Oral Daily  . vancomycin variable dose per unstable renal function (pharmacist dosing)   Does not apply See admin instructions   Continuous Infusions: . sodium chloride 250 mL (01/31/20 2108)  . ceFEPime (MAXIPIME) IV 2 g (01/31/20 2110)     LOS: 2 days   Time spent: 25  Hoyt Koch, MD [facility]  To contact the attending provider between 7A-7P or the covering provider during after hours 7P-7A, please log into the web site [email or web address] and access using universal [place] password for that web site. If you do not have the password, please call the hospital operator.  02/01/2020, 1:14 PM

## 2020-02-01 NOTE — Progress Notes (Signed)
Orthopedic Tech Progress Note Patient Details:  NICKSON MIDDLESWORTH [birth year] [id number]  Ortho Devices Type of Ortho Device: Ace wrap Ortho Device/Splint Location: RLE Ortho Device/Splint Interventions: Application   Post Interventions Patient Tolerated: Well Instructions Provided: Care of device   Linus Salmons Liesel Peckenpaugh 02/01/2020, 8:37 PM

## 2020-02-02 ENCOUNTER — Ambulatory Visit (HOSPITAL_COMMUNITY): Payer: Medicare Other | Admitting: *Deleted

## 2020-02-02 ENCOUNTER — Other Ambulatory Visit (HOSPITAL_COMMUNITY): Payer: Medicare Other

## 2020-02-02 ENCOUNTER — Inpatient Hospital Stay (HOSPITAL_COMMUNITY): Payer: Medicare Other

## 2020-02-02 DIAGNOSIS — R55 Syncope and collapse: Secondary | ICD-10-CM

## 2020-02-02 DIAGNOSIS — N179 Acute kidney failure, unspecified: Secondary | ICD-10-CM

## 2020-02-02 LAB — CBC
HCT: 30.8 % — ABNORMAL LOW (ref 39.0–52.0)
Hemoglobin: 9.5 g/dL — ABNORMAL LOW (ref 13.0–17.0)
MCH: 32.6 pg (ref 26.0–34.0)
MCHC: 30.8 g/dL (ref 30.0–36.0)
MCV: 105.8 fL — ABNORMAL HIGH (ref 80.0–100.0)
Platelets: 21 10*3/uL — CL (ref 150–400)
RBC: 2.91 MIL/uL — ABNORMAL LOW (ref 4.22–5.81)
RDW: 19.5 % — ABNORMAL HIGH (ref 11.5–15.5)
WBC: 4 10*3/uL (ref 4.0–10.5)
nRBC: 0 % (ref 0.0–0.2)

## 2020-02-02 LAB — COMPREHENSIVE METABOLIC PANEL
ALT: 20 U/L (ref 0–44)
AST: 41 U/L (ref 15–41)
Albumin: 2.4 g/dL — ABNORMAL LOW (ref 3.5–5.0)
Alkaline Phosphatase: 196 U/L — ABNORMAL HIGH (ref 38–126)
Anion gap: 10 (ref 5–15)
BUN: 66 mg/dL — ABNORMAL HIGH (ref 8–23)
CO2: 21 mmol/L — ABNORMAL LOW (ref 22–32)
Calcium: 8.1 mg/dL — ABNORMAL LOW (ref 8.9–10.3)
Chloride: 106 mmol/L (ref 98–111)
Creatinine, Ser: 3.1 mg/dL — ABNORMAL HIGH (ref 0.61–1.24)
GFR calc Af Amer: 23 mL/min — ABNORMAL LOW (ref >60)
GFR calc non Af Amer: 19 mL/min — ABNORMAL LOW (ref >60)
Glucose, Bld: 99 mg/dL (ref 70–99)
Potassium: 3.2 mmol/L — ABNORMAL LOW (ref 3.5–5.1)
Sodium: 137 mmol/L (ref 135–145)
Total Bilirubin: 2.1 mg/dL — ABNORMAL HIGH (ref 0.3–1.2)
Total Protein: 6 g/dL — ABNORMAL LOW (ref 6.5–8.1)

## 2020-02-02 LAB — CUP PACEART REMOTE DEVICE CHECK
Date Time Interrogation Session: 20210401004756
Implantable Pulse Generator Implant Date: 20180301

## 2020-02-02 MED ORDER — CEPHALEXIN 250 MG PO CAPS
500.0000 mg | ORAL_CAPSULE | Freq: Four times a day (QID) | ORAL | Status: DC
  Administered 2020-02-02 – 2020-02-03 (×4): 500 mg via ORAL
  Filled 2020-02-02 (×4): qty 2

## 2020-02-02 MED ORDER — METOLAZONE 2.5 MG PO TABS
2.5000 mg | ORAL_TABLET | Freq: Once | ORAL | Status: AC
  Administered 2020-02-02: 2.5 mg via ORAL
  Filled 2020-02-02: qty 1

## 2020-02-02 MED ORDER — POTASSIUM CHLORIDE CRYS ER 20 MEQ PO TBCR
40.0000 meq | EXTENDED_RELEASE_TABLET | Freq: Once | ORAL | Status: AC
  Administered 2020-02-02: 40 meq via ORAL
  Filled 2020-02-02: qty 2

## 2020-02-02 MED ORDER — POTASSIUM CHLORIDE CRYS ER 20 MEQ PO TBCR
20.0000 meq | EXTENDED_RELEASE_TABLET | Freq: Two times a day (BID) | ORAL | Status: DC
  Administered 2020-02-03 (×2): 20 meq via ORAL
  Filled 2020-02-02 (×2): qty 1

## 2020-02-02 MED ORDER — TRAMADOL HCL 50 MG PO TABS
50.0000 mg | ORAL_TABLET | Freq: Once | ORAL | Status: AC
  Administered 2020-02-02: 50 mg via ORAL
  Filled 2020-02-02: qty 1

## 2020-02-02 NOTE — Progress Notes (Signed)
CARDIAC REHAB PHASE I   PRE:  Rate/Rhythm: 73 SR  BP:  Supine: 103/63  Sitting:   Standing:    SaO2: 95%RA  MODE:  Ambulation: 190 ft   POST:  Rate/Rhythm: 88  BP:  Supine:   Sitting: 108/59  Standing:    SaO2: 95%RA 1430-1516 Pt walked 190 ft on RA with asst x 1 with steady gait. Tolerated well. Slightly SOB upon return to bed but sats good. Pt very determined to mobilize.  Gave low sodium diets for review. Will continue to see.   Graylon Good, RN BSN  02/02/2020 3:13 PM

## 2020-02-02 NOTE — Progress Notes (Signed)
PROGRESS NOTE    Darren Mueller  KZL:[id number] DOB: 1951/05/15 DOA: 01/30/2020 PCP: Mosie Lukes, MD      Brief Narrative:  Darren Mueller is a 69 y.o. M with hx pHTN, cirrhosis, dCHF, and CKD IIIb who presented with days progressive shortness of breath, leg swelling.  Patient had recently been admitted for congestive heart failure.  Diuresed to 199lbs and discharged.,  Started to develop chills, then leg swelling, return with weight to 19 pounds, BNP >4500 and WBC 17K.  Started on antibiotics for cellulitis and IV Lasix.           Assessment & Plan:  Acute on chronic right heart failure Acute on chronic diastolic CHF and severe pulmonary hypertension Net -1100 cc yesterday, creatinine improving, potassium low. -Continue furosemide 80 mg IV twice a day  -Add metolazone -Start K supplement -Strict I/Os, daily weights, telemetry  -Daily monitoring renal function -Continue Xelexipag, macitentan and tadalafil -Consult HF team, appreciate expert cares    Right lower extremity cellulitis Redness completely resolved.  No purulent component, doubt MRSA.  DVT is considered unlikely.    -Stop vancomycin -Start cephalexin -Apply unna boot  Cirrhosis Esophageal varices Severe thrombocytopenia Platelet count stable  AKI on CKD stage IIIb Nephropathy of congestion. Improving.    Hypertension Coronary disease, secondary prevention -Continue rosuvastatin  COPD No evidence of bronchospasm.  Positive blood culture Single blood culture with coag negative staph.  I think the overwhelming likelihood is that this is a contaminant.  I will narrow antibiotics, but closer monitoring and also repeat blood cultures.          Disposition: The patient was admitted with congestive heart failure and cellulitis. The patient remains fluid overloaded.  We will apply Unna boots and continue IV Lasix.  I will discharge when his fluid status is improved and he is able to  transition to oral diuretics in addition to oral antibiotics.        MDM: The below labs and imaging reports were reviewed and summarized above.  Medication management as above.  This is a severe exacerbation of his chronic disease.   DVT prophylaxis: SCDs Code Status: Full code Family Communication:     Consultants:   Heart failure  Procedures:   3/20 echocardiogram  Antimicrobials:   Vancomycin and cefepime 3/29-3/31  Cephalexin 4/1>>  Culture data:   Single blood culture with coag negative staph, likely contaminant          Subjective: Patient is feeling fairly well.  Swelling is improving gradually.  Redness in his leg is improving.  No fever overnight.  No vomiting, hematemesis, melena.  Objective: Vitals:   02/02/20 0345 02/02/20 1018 02/02/20 1340 02/02/20 1342  BP: 103/61 (!) 92/58 114/73 114/73  Pulse: 72 71 71 69  Resp: 18   17  Temp: 98.6 F (37 C) (!) 97.5 F (36.4 C)    TempSrc:  Oral    SpO2: 100% 100% 97% 98%  Weight:      Height:        Intake/Output Summary (Last 24 hours) at 02/02/2020 1454 Last data filed at 02/02/2020 1057 Gross per 24 hour  Intake 580.18 ml  Output 1695 ml  Net -1114.82 ml   Filed Weights   01/30/20 1548 02/01/20 0338 02/02/20 0225  Weight: 99.3 kg 89.3 kg 88.5 kg    Examination: General appearance:  adult male, alert and in no obvious distress.  Lying in bed, appears chronically debilitated. HEENT: Anicteric, conjunctiva pink, lids  and lashes normal. No nasal deformity, discharge, epistaxis.  Lips moist, dentition in good repair, oropharynx moist, no oral lesions, hearing normal.   Skin: Warm and dry.  No jaundice.  No suspicious rashes or lesions.  See above image of the right calf.  Redness is almost completely resolved. Cardiac: RRR, nl S1-S2, no murmurs appreciated.  Capillary refill is brisk.  JVP not visible.  Moderate right-sided LE edema.  Radial  pulses 2+ and symmetric. Respiratory: Normal  respiratory rate and rhythm.  CTAB without rales or wheezes. Abdomen: Abdomen soft.  No TTP or guarding. No ascites, distension, hepatosplenomegaly.   MSK: No deformities or effusions. Neuro: Awake and alert.  EOMI, moves all extremities. Speech fluent.    Psych: Sensorium intact and responding to questions, attention normal. Affect normal.  Judgment and insight appear normal.    Data Reviewed: I have personally reviewed following labs and imaging studies:  CBC: Recent Labs  Lab 01/30/20 1605 01/31/20 0337 02/01/20 0442 02/02/20 0525  WBC 17.9* 10.7* 7.5 4.0  NEUTROABS 15.1* 8.8*  --   --   HGB 11.6* 9.9* 9.6* 9.5*  HCT 38.3* 32.3* 31.3* 30.8*  MCV 107.9* 108.0* 107.9* 105.8*  PLT 32* 20* 20* 21*   Basic Metabolic Panel: Recent Labs  Lab 01/30/20 1605 01/31/20 0337 02/01/20 0442 02/02/20 0525  NA 134* 134* 132* 137  K 4.8 4.6 4.0 3.2*  CL 105 105 103 106  CO2 13* 15* 16* 21*  GLUCOSE 116* 96 126* 99  BUN 57* 68* 73* 66*  CREATININE 4.54* 4.41* 3.73* 3.10*  CALCIUM 8.5* 8.0* 7.8* 8.1*  PHOS  --   --  4.0  --    GFR: Estimated Creatinine Clearance: 25.4 mL/min (A) (by C-G formula based on SCr of 3.1 mg/dL (H)). Liver Function Tests: Recent Labs  Lab 01/30/20 1605 01/31/20 0337 02/01/20 0442 02/02/20 0525  AST 57* 42*  --  41  ALT 25 19  --  20  ALKPHOS 293* 217*  --  196*  BILITOT 4.4* 3.0*  --  2.1*  PROT 7.7 6.0*  --  6.0*  ALBUMIN 3.1* 2.5* 2.4* 2.4*   No results for input(s): LIPASE, AMYLASE in the last 168 hours. No results for input(s): AMMONIA in the last 168 hours. Coagulation Profile: No results for input(s): INR, PROTIME in the last 168 hours. Cardiac Enzymes: No results for input(s): CKTOTAL, CKMB, CKMBINDEX, TROPONINI in the last 168 hours. BNP (last 3 results) Recent Labs    10/11/19 1138 12/08/19 1041 01/11/20 1439  PROBNP 604.0* 710.0* 494.0*   HbA1C: No results for input(s): HGBA1C in the last 72 hours. CBG: No results for  input(s): GLUCAP in the last 168 hours. Lipid Profile: No results for input(s): CHOL, HDL, LDLCALC, TRIG, CHOLHDL, LDLDIRECT in the last 72 hours. Thyroid Function Tests: Recent Labs    02/01/20 0442  TSH 1.805   Anemia Panel: No results for input(s): VITAMINB12, FOLATE, FERRITIN, TIBC, IRON, RETICCTPCT in the last 72 hours. Urine analysis:    Component Value Date/Time   COLORURINE YELLOW 11/18/2017 0250   APPEARANCEUR CLEAR 11/18/2017 0250   LABSPEC 1.008 11/18/2017 0250   PHURINE 5.0 11/18/2017 0250   GLUCOSEU NEGATIVE 11/18/2017 0250   HGBUR NEGATIVE 11/18/2017 0250   BILIRUBINUR neg 12/18/2017 1352   KETONESUR NEGATIVE 11/18/2017 0250   PROTEINUR net 12/18/2017 1352   PROTEINUR NEGATIVE 11/18/2017 0250   UROBILINOGEN 0.2 12/18/2017 1352   UROBILINOGEN 0.2 10/18/2013 1247   NITRITE neg 12/18/2017 1352  NITRITE NEGATIVE 11/18/2017 0250   LEUKOCYTESUR Negative 12/18/2017 1352   Sepsis Labs: _0 (procalcitonin:4,lacticacidven:4)  ) Recent Results (from the past 240 hour(s))  SARS CORONAVIRUS 2 (TAT 6-24 HRS) Nasopharyngeal Nasopharyngeal Swab     Status: None   Collection Time: 01/30/20  8:59 PM   Specimen: Nasopharyngeal Swab  Result Value Ref Range Status   SARS Coronavirus 2 NEGATIVE NEGATIVE Final    Comment: (NOTE) SARS-CoV-2 target nucleic acids are NOT DETECTED. The SARS-CoV-2 RNA is generally detectable in upper and lower respiratory specimens during the acute phase of infection. Negative results do not preclude SARS-CoV-2 infection, do not rule out co-infections with other pathogens, and should not be used as the sole basis for treatment or other patient management decisions. Negative results must be combined with clinical observations, patient history, and epidemiological information. The expected result is Negative. Fact Sheet for Patients: SugarRoll.be Fact Sheet for Healthcare  Providers: https://www.woods-mathews.com/ This test is not yet approved or cleared by the [place] FDA and  has been authorized for detection and/or diagnosis of SARS-CoV-2 by FDA under an Emergency Use Authorization (EUA). This EUA will remain  in effect (meaning this test can be used) for the duration of the COVID-19 declaration under Section 56 4(b)(1) of the Act, 21 U.S.C. section 360bbb-3(b)(1), unless the authorization is terminated or revoked sooner. Performed at [facility], [place] [address] [place], [place] [address]   Culture, blood (Routine X 2) w Reflex to ID Panel     Status: Abnormal   Collection Time: 01/30/20  9:25 PM   Specimen: BLOOD  Result Value Ref Range Status   Specimen Description BLOOD LEFT UPPER ARM  Final   Special Requests   Final    BOTTLES DRAWN AEROBIC AND ANAEROBIC Blood Culture adequate volume   Culture  Setup Time   Final    GRAM POSITIVE COCCI IN CLUSTERS AEROBIC BOTTLE ONLY CRITICAL RESULT CALLED TO, READ BACK BY AND VERIFIED WITHJiles Garter [facility] PHARMD 8127 01/31/20 A BROWNING    Culture (A)  Final    STAPHYLOCOCCUS SPECIES (COAGULASE NEGATIVE) THE SIGNIFICANCE OF ISOLATING THIS ORGANISM FROM A SINGLE SET OF BLOOD CULTURES WHEN MULTIPLE SETS ARE DRAWN IS UNCERTAIN. PLEASE NOTIFY THE MICROBIOLOGY DEPARTMENT WITHIN ONE WEEK IF SPECIATION AND SENSITIVITIES ARE REQUIRED. Performed at [facility], [place] [address] [place], [place] [address]    Report Status 02/01/2020 FINAL  Final         Radiology Studies: DG Chest 2 View  Result Date: 02/02/2020 CLINICAL DATA:  Follow-up of pneumonia. EXAM: CHEST - 2 VIEW COMPARISON:  01/30/2019 FINDINGS: Midline trachea. Mild cardiomegaly, accentuated by AP portable technique on the frontal radiograph. No pleural effusion or pneumothorax. Suspect mild pulmonary venous congestion. No lobar consolidation. Loop recorder. IMPRESSION: Cardiomegaly with suspicion of mild pulmonary venous  congestion. No lobar consolidation to suggest pneumonia. Electronically Signed   By: Abigail Miyamoto M.D.   On: 02/02/2020 09:14   CUP PACEART REMOTE DEVICE CHECK  Result Date: 02/02/2020 Care[facility] summary report received. Battery status OK. Normal device function. No new symptom episodes, tachy episodes, brady, or pause episodes. No new AF episodes. Monthly summary reports and ROV/PRN Kathy Breach, RN, CCDS, [employer]       Scheduled Meds: . acidophilus  1 capsule Oral Daily  . cephALEXin  500 mg Oral Q6H  . Ferrous Fumarate  1 tablet Oral Daily  . folic acid  1 mg Oral Daily  . furosemide  80  mg Intravenous BID  . macitentan  10 mg Oral Daily  . pantoprazole  40 mg Oral Daily  . rosuvastatin  5 mg Oral q1800  . Selexipag  1,000 mcg Oral BID  . sodium chloride flush  3 mL Intravenous Q12H  . tadalafil  40 mg Oral Daily  . thiamine  100 mg Oral Daily   Continuous Infusions: . sodium chloride 10 mL/hr at 02/01/20 1547     LOS: 3 days    Time spent: 35 minutes    Edwin Dada, MD [facility] 02/02/2020, 2:54 PM     Please page though [place] or Epic secure chat:  For [employer], Adult nurse

## 2020-02-02 NOTE — Care Management Important Message (Signed)
Important Message  Patient Details  Name: Darren Mueller MRN: [id number] Date of Birth: [birth year]   Medicare Important Message Given:  Yes     Shelda Altes 02/02/2020, 9:10 AM

## 2020-02-02 NOTE — Consult Note (Addendum)
[facility] Inpatient Consult   02/02/2020  Darren Mueller October 20, 1951 [id number]  EMMI Discharge Red alert call 02/01/20:  Patient with readmission in less than 7 days, high risk, Medicare [employer] Accountable Care Organization [ACO] member  Patient evaluated for community based chronic disease management services with [facility] [[facility] Care Management Program as a benefit of patient's [employer]. Spoke with patient at bedside hospital phone, HIPAA verified,  to explain [facility] Care Management services. Explained automated calls from the hospital [facility] nurse to follow up. Verbal permission granted for post hospital follow up.  Patient admitted with worsening shortness of breath with weight gain.  Admitted with HF exacerbation.  Primary Care Provider:  Dr. Danise Edge, [name corrected @ 1541] [facility], this office provides the transition of care follow up.   Patient will receive post hospital discharge call and for assessments fo community needs for care/disease management.    Of note, [facility] Care Management services does not replace or interfere with any services that are arranged by inpatient Transition of Care [TOC] team     For additional questions or referrals please contact:    Charlesetta Shanks, RN BSN CCM [facility]  [phone] business mobile phone Toll free office [phone]  Fax number: [phone] [email or web address]  [email or web address]

## 2020-02-02 NOTE — Progress Notes (Addendum)
Advanced Heart Failure Rounding Note  PCP-Cardiologist: Glori Bickers, MD   Subjective:   Yesterday diuresed with IV lasix.    Weight down from 219 home weight--> 196 -->195    SOB with exertion. Complaining of abdominal bloating.    Objective:   Weight Range: 88.5 kg Body mass index is 25.73 kg/m.   Vital Signs:   Temp:  [97.5 F (36.4 C)-98.6 F (37 C)] 97.5 F (36.4 C) (04/01 1018) Pulse Rate:  [71-79] 71 (04/01 1018) Resp:  [16-18] 18 (04/01 0345) BP: (92-110)/(56-74) 92/58 (04/01 1018) SpO2:  [99 %-100 %] 100 % (04/01 1018) Weight:  [88.5 kg] 88.5 kg (04/01 0225) Last BM Date: 02/01/20  Weight change: Filed Weights   01/30/20 1548 02/01/20 0338 02/02/20 0225  Weight: 99.3 kg 89.3 kg 88.5 kg    Intake/Output:   Intake/Output Summary (Last 24 hours) at 02/02/2020 1238 Last data filed at 02/02/2020 1057 Gross per 24 hour  Intake 580.18 ml  Output 1695 ml  Net -1114.82 ml      Physical Exam    General:   No resp difficulty HEENT: normal Neck: supple. JVP 11-12 . Carotids 2+ bilat; no bruits. No lymphadenopathy or thryomegaly appreciated. Cor: PMI nondisplaced. Regular rate & rhythm. No rubs, gallops or murmurs. Lungs: clear Abdomen: soft, nontender, distended. No hepatosplenomegaly. No bruits or masses. Good bowel sounds. Extremities: no cyanosis, clubbing, rash, RLE >LLE 1+ edema Neuro: alert & orientedx3, cranial nerves grossly intact. moves all 4 extremities w/o difficulty. Affect pleasant   Telemetry  NSR 70-80s personally reviewed   EKG    n/a  Labs    CBC Recent Labs    01/30/20 1605 01/30/20 1605 01/31/20 0337 01/31/20 0337 02/01/20 0442 02/02/20 0525  WBC 17.9*   < > 10.7*   < > 7.5 4.0  NEUTROABS 15.1*  --  8.8*  --   --   --   HGB 11.6*   < > 9.9*   < > 9.6* 9.5*  HCT 38.3*   < > 32.3*   < > 31.3* 30.8*  MCV 107.9*   < > 108.0*   < > 107.9* 105.8*  PLT 32*   < > 20*   < > 20* 21*   < > = values in this interval not  displayed.   Basic Metabolic Panel Recent Labs    02/01/20 0442 02/02/20 0525  NA 132* 137  K 4.0 3.2*  CL 103 106  CO2 16* 21*  GLUCOSE 126* 99  BUN 73* 66*  CREATININE 3.73* 3.10*  CALCIUM 7.8* 8.1*  PHOS 4.0  --    Liver Function Tests Recent Labs    01/31/20 0337 01/31/20 0337 02/01/20 0442 02/02/20 0525  AST 42*  --   --  41  ALT 19  --   --  20  ALKPHOS 217*  --   --  196*  BILITOT 3.0*  --   --  2.1*  PROT 6.0*  --   --  6.0*  ALBUMIN 2.5*   < > 2.4* 2.4*   < > = values in this interval not displayed.   No results for input(s): LIPASE, AMYLASE in the last 72 hours. Cardiac Enzymes No results for input(s): CKTOTAL, CKMB, CKMBINDEX, TROPONINI in the last 72 hours.  BNP: BNP (last 3 results) Recent Labs    05/16/19 1051 01/20/20 1504 01/30/20 1605  BNP 1,119.2* 506.0* >4,500.0*    ProBNP (last 3 results) Recent Labs    10/11/19  1138 12/08/19 1041 01/11/20 1439  PROBNP 604.0* 710.0* 494.0*     D-Dimer No results for input(s): DDIMER in the last 72 hours. Hemoglobin A1C No results for input(s): HGBA1C in the last 72 hours. Fasting Lipid Panel No results for input(s): CHOL, HDL, LDLCALC, TRIG, CHOLHDL, LDLDIRECT in the last 72 hours. Thyroid Function Tests Recent Labs    02/01/20 0442  TSH 1.805    Other results:   Imaging    DG Chest 2 View  Result Date: 02/02/2020 CLINICAL DATA:  Follow-up of pneumonia. EXAM: CHEST - 2 VIEW COMPARISON:  01/30/2019 FINDINGS: Midline trachea. Mild cardiomegaly, accentuated by AP portable technique on the frontal radiograph. No pleural effusion or pneumothorax. Suspect mild pulmonary venous congestion. No lobar consolidation. Loop recorder. IMPRESSION: Cardiomegaly with suspicion of mild pulmonary venous congestion. No lobar consolidation to suggest pneumonia. Electronically Signed   By: Abigail Miyamoto M.D.   On: 02/02/2020 09:14   CUP PACEART REMOTE DEVICE CHECK  Result Date: 02/02/2020 Carelink summary  report received. Battery status OK. Normal device function. No new symptom episodes, tachy episodes, brady, or pause episodes. No new AF episodes. Monthly summary reports and ROV/PRN Kathy Breach, RN, CCDS, [employer]    Medications:     Scheduled Medications: . acidophilus  1 capsule Oral Daily  . cephALEXin  500 mg Oral Q6H  . Ferrous Fumarate  1 tablet Oral Daily  . folic acid  1 mg Oral Daily  . furosemide  80 mg Intravenous BID  . macitentan  10 mg Oral Daily  . pantoprazole  40 mg Oral Daily  . potassium chloride  40 mEq Oral Once  . rosuvastatin  5 mg Oral q1800  . Selexipag  1,000 mcg Oral BID  . sodium chloride flush  3 mL Intravenous Q12H  . tadalafil  40 mg Oral Daily  . thiamine  100 mg Oral Daily    Infusions: . sodium chloride 10 mL/hr at 02/01/20 1547    PRN Medications: sodium chloride, acetaminophen, loperamide, ondansetron **OR** ondansetron (ZOFRAN) IV, sodium chloride flush     Assessment/Plan  1. Cellulitis -WBC 17 on admit. Possible source RLE  - WBC down to 7.5  -Blood Cx obtained 3/29. - Continue vancomycin - Add ace wrap to RLE  2. A/C Diastolic Heart Failure   ECHO 01/2020 EF 60-65% Grade II DD -Volume status remains elevated. Continue  80 mg IV lasix twice daily and give 2.5 mg metolazone. Dry weight 192 pounds.  - Renal function improving.   - May need to add milrinone to support RV.   3 PAH: Suspected portopulmonary HTN in setting of ETOH cirrhosis. On triple therapy uptravi 1000 mcg twice a day, opsumit 10 mg daily, and tadalafil 40 mg daily. Continue home [place] meds.   4 AKI CKD Stage IIIB  Creatinine on 3/25 was 2.7. Creatinine on admit 4.54---> 3.7 -->3.1   5. Cirrhosis  6. Thrombocytopenia Platelets 21   He plans to discuss [place] with his family. He plans for  his daughter to be HCPOA.     Length of Stay: 3  Amy Clegg, NP  02/02/2020, 12:38 PM  Advanced Heart Failure Team Pager [phone] (M-F; 7a - 4p)    Please contact [facility] for night-coverage after hours (4p -7a ) and weekends on [email or web address]  Patient seen and examined with the above-signed Advanced Practice Provider and/or Housestaff. I personally reviewed laboratory data, imaging studies and relevant notes. I independently examined the patient and formulated the important  aspects of the plan. I have edited the note to reflect any of my changes or salient points. I have personally discussed the plan with the patient and/or family.  He has diuresed well. Renal function now improving.   RLE looks much better. Infection improving. Lymphedema improved with Unna boot. Platelets remain < 30k. No obvious bleeding.   General:  Sitting up in bed. No resp difficulty HEENT: normal Neck: supple. JVP 10 . Carotids 2+ bilat; no bruits. No lymphadenopathy or thryomegaly appreciated. Cor: PMI nondisplaced. Regular rate & rhythm. 2/6 TR Lungs: clear Abdomen: soft, nontender, nondistended. No hepatosplenomegaly. No bruits or masses. Good bowel sounds. Extremities: no cyanosis, clubbing, rash, tr edema RLE wrapped  Neuro: alert & orientedx3, cranial nerves grossly intact. moves all 4 extremities w/o difficulty. Affect pleasant  Sepsis is resolving. RLE infection improved. Renal function improved. Volume stats looks better. Agree with switch to po diuretics. Continue PAH meds.   I worry his 72month- 1 year prognosis is quite poor.   DGlori Bickers MD  7:08 PM

## 2020-02-02 NOTE — Progress Notes (Signed)
Orthopedic Tech Progress Note Patient Details:  Darren Mueller 1951/08/10 [id number]  Ortho Devices Type of Ortho Device: Haematologist Ortho Device/Splint Location: RLE Ortho Device/Splint Interventions: Ordered, Application   Post Interventions Patient Tolerated: Well Instructions Provided: [facility] of [place] 02/02/2020, 2:00 PM

## 2020-02-02 NOTE — Progress Notes (Signed)
Patient requests pain medication for headache and Tylenol max dose has been reached, paged provider for alternative.

## 2020-02-02 NOTE — Progress Notes (Signed)
Responded to unit page to visit with patient per patient request.  Patient  Indicated that he is Lutheran and wanted to take communion . Presently SCW /Chaplain unable to do so. Chaplain arranged for patient's Doristine Bosworth to come to hospital and administer communion.  This was cleared with unit director and patients nurse.  Chaplain available to assist as  Needed.   Jaclynn Major, [place], [facility], Pager [phone]

## 2020-02-02 NOTE — Progress Notes (Signed)
Patient has poor appetite and reported concerns about another episode of GI discomfort as prior day if he eats. RN informed patient now on Acidophilus, encouraged eating and will continue to monitor for GI discomfort.   Paged [place] about 1451 Please call back regarding unna boot and [place] for DVT. Upon callback from [place] at 1452 informed per [place] only can do [place] from knee to groin given that unna boot is now in place or remove unna boot to do [place], per MD Danford will cancel [place].

## 2020-02-02 NOTE — Progress Notes (Signed)
ILR Remote 

## 2020-02-03 DIAGNOSIS — N183 Chronic kidney disease, stage 3 unspecified: Secondary | ICD-10-CM

## 2020-02-03 LAB — BASIC METABOLIC PANEL
Anion gap: 10 (ref 5–15)
BUN: 61 mg/dL — ABNORMAL HIGH (ref 8–23)
CO2: 21 mmol/L — ABNORMAL LOW (ref 22–32)
Calcium: 8 mg/dL — ABNORMAL LOW (ref 8.9–10.3)
Chloride: 104 mmol/L (ref 98–111)
Creatinine, Ser: 2.8 mg/dL — ABNORMAL HIGH (ref 0.61–1.24)
GFR calc Af Amer: 25 mL/min — ABNORMAL LOW (ref >60)
GFR calc non Af Amer: 22 mL/min — ABNORMAL LOW (ref >60)
Glucose, Bld: 91 mg/dL (ref 70–99)
Potassium: 3.5 mmol/L (ref 3.5–5.1)
Sodium: 135 mmol/L (ref 135–145)

## 2020-02-03 LAB — CBC
HCT: 30.9 % — ABNORMAL LOW (ref 39.0–52.0)
Hemoglobin: 9.5 g/dL — ABNORMAL LOW (ref 13.0–17.0)
MCH: 32.5 pg (ref 26.0–34.0)
MCHC: 30.7 g/dL (ref 30.0–36.0)
MCV: 105.8 fL — ABNORMAL HIGH (ref 80.0–100.0)
Platelets: 21 10*3/uL — CL (ref 150–400)
RBC: 2.92 MIL/uL — ABNORMAL LOW (ref 4.22–5.81)
RDW: 19 % — ABNORMAL HIGH (ref 11.5–15.5)
WBC: 2.2 10*3/uL — ABNORMAL LOW (ref 4.0–10.5)
nRBC: 0 % (ref 0.0–0.2)

## 2020-02-03 MED ORDER — CEPHALEXIN 250 MG PO CAPS
500.0000 mg | ORAL_CAPSULE | Freq: Three times a day (TID) | ORAL | Status: DC
  Administered 2020-02-03 – 2020-02-05 (×6): 500 mg via ORAL
  Filled 2020-02-03 (×6): qty 2

## 2020-02-03 MED ORDER — TORSEMIDE 20 MG PO TABS
60.0000 mg | ORAL_TABLET | Freq: Two times a day (BID) | ORAL | Status: DC
  Administered 2020-02-03 – 2020-02-05 (×4): 60 mg via ORAL
  Filled 2020-02-03 (×4): qty 3

## 2020-02-03 MED ORDER — TRAMADOL HCL 50 MG PO TABS
50.0000 mg | ORAL_TABLET | Freq: Four times a day (QID) | ORAL | Status: DC | PRN
  Administered 2020-02-03 – 2020-02-05 (×4): 50 mg via ORAL
  Filled 2020-02-03 (×4): qty 1

## 2020-02-03 NOTE — Progress Notes (Addendum)
Advanced Heart Failure Rounding Note  PCP-Cardiologist: Glori Bickers, MD   Subjective:    Volume much improved w/ diuretics. Weight down from 219 home weight--> 196 -->195 ->>190 lb.   SCr much improved, 3.73>>3.10>>2.80.  RLE cellulitis improving w/ abx. AF. Blood cultures NGTD  Breathing improved. Able to walk to bathroom w/o SOB. Continues w/ frontal HA. SBPs soft, low 90s-110s but no orthostatic symptoms.     Objective:   Weight Range: 86.3 kg Body mass index is 25.09 kg/m.   Vital Signs:   Temp:  [97.7 F (36.5 C)-98.2 F (36.8 C)] 97.7 F (36.5 C) (04/02 1403) Pulse Rate:  [72-82] 72 (04/02 1403) Resp:  [17-20] 17 (04/02 1403) BP: (92-100)/(54-66) 92/54 (04/02 1403) SpO2:  [98 %-99 %] 98 % (04/02 1403) Weight:  [86.3 kg] 86.3 kg (04/02 0624) Last BM Date: 02/02/20  Weight change: Filed Weights   02/01/20 0338 02/02/20 0225 02/03/20 0624  Weight: 89.3 kg 88.5 kg 86.3 kg    Intake/Output:   Intake/Output Summary (Last 24 hours) at 02/03/2020 1459 Last data filed at 02/03/2020 1404 Gross per 24 hour  Intake 840 ml  Output 1900 ml  Net -1060 ml      Physical Exam    General:  Fatigue appearing AAM. No respiratory difficulty  HEENT: normal Neck: supple. Elevated JVP to angle of jaw . Carotids 2+ bilat; no bruits. No lymphadenopathy or thryomegaly appreciated. Cor: PMI nondisplaced. Regular rate & rhythm. No rubs, gallops or murmurs. Lungs: clear Abdomen: soft, nontender, distended. No hepatosplenomegaly. No bruits or masses. Good bowel sounds. Extremities: no cyanosis, clubbing, rash, trace bilateral LEE, + UNNA boot on rt  Neuro: alert & orientedx3, cranial nerves grossly intact. moves all 4 extremities w/o difficulty. Affect pleasant   Telemetry  NSR 70-80s, 1st degree AVB  personally reviewed   EKG    n/a  Labs    CBC Recent Labs    02/02/20 0525 02/03/20 0553  WBC 4.0 2.2*  HGB 9.5* 9.5*  HCT 30.8* 30.9*  MCV 105.8* 105.8*    PLT 21* 21*   Basic Metabolic Panel Recent Labs    02/01/20 0442 02/01/20 0442 02/02/20 0525 02/03/20 0553  NA 132*   < > 137 135  K 4.0   < > 3.2* 3.5  CL 103   < > 106 104  CO2 16*   < > 21* 21*  GLUCOSE 126*   < > 99 91  BUN 73*   < > 66* 61*  CREATININE 3.73*   < > 3.10* 2.80*  CALCIUM 7.8*   < > 8.1* 8.0*  PHOS 4.0  --   --   --    < > = values in this interval not displayed.   Liver Function Tests Recent Labs    02/01/20 0442 02/02/20 0525  AST  --  41  ALT  --  20  ALKPHOS  --  196*  BILITOT  --  2.1*  PROT  --  6.0*  ALBUMIN 2.4* 2.4*   No results for input(s): LIPASE, AMYLASE in the last 72 hours. Cardiac Enzymes No results for input(s): CKTOTAL, CKMB, CKMBINDEX, TROPONINI in the last 72 hours.  BNP: BNP (last 3 results) Recent Labs    05/16/19 1051 01/20/20 1504 01/30/20 1605  BNP 1,119.2* 506.0* >4,500.0*    ProBNP (last 3 results) Recent Labs    10/11/19 1138 12/08/19 1041 01/11/20 1439  PROBNP 604.0* 710.0* 494.0*     D-Dimer No results for  input(s): DDIMER in the last 72 hours. Hemoglobin A1C No results for input(s): HGBA1C in the last 72 hours. Fasting Lipid Panel No results for input(s): CHOL, HDL, LDLCALC, TRIG, CHOLHDL, LDLDIRECT in the last 72 hours. Thyroid Function Tests Recent Labs    02/01/20 0442  TSH 1.805    Other results:   Imaging    No results found.   Medications:     Scheduled Medications: . acidophilus  1 capsule Oral Daily  . cephALEXin  500 mg Oral Q8H  . Ferrous Fumarate  1 tablet Oral Daily  . folic acid  1 mg Oral Daily  . macitentan  10 mg Oral Daily  . pantoprazole  40 mg Oral Daily  . potassium chloride  20 mEq Oral BID  . rosuvastatin  5 mg Oral q1800  . Selexipag  1,000 mcg Oral BID  . sodium chloride flush  3 mL Intravenous Q12H  . tadalafil  40 mg Oral Daily  . thiamine  100 mg Oral Daily  . torsemide  60 mg Oral BID    Infusions: . sodium chloride 10 mL/hr at 02/01/20 1547     PRN Medications: sodium chloride, acetaminophen, loperamide, ondansetron **OR** ondansetron (ZOFRAN) IV, sodium chloride flush, traMADol     Assessment/Plan   1. RLE Cellulitis - WBC 17 on admit. Down to 2.2 today - Blood Cx obtained 3/29. NGTD - Off vancomycin. Now on Keflex 500 mg tid  - Continue w/ ace wrap to RLE  2. A/C Diastolic Heart Failure   - ECHO 01/2020 EF 60-65% Grade II DD - Volume status much improve w/ IV diuretics. Back on PO - Continue PO torsemide 60 mg bid  - Renal function improving.    3 PAH: Suspected portopulmonary HTN in setting of ETOH cirrhosis. - On triple therapy, uptravi 1000 mcg twice a day, opsumit 10 mg daily, and tadalafil 40 mg daily. - Continue home [place] meds.   4 AKI CKD Stage IIIB  - Creatinine on 3/25 was 2.7. - Creatinine on admit 4.54 - Improved w/ diuresis, down to 2.8 today   5. Cirrhosis - management per primary   6. Thrombocytopenia - Platelets 21.  - History of splenic sequestration. - no obvious bleeding  7. Soft BP - SBPs 90s-110s - hgb stable  - check orthostatics   Length of Stay: [address] PA-C  02/03/2020, 2:59 PM  Advanced Heart Failure Team Pager [phone] (M-F; 7a - 4p)  Please contact [facility] for night-coverage after hours (4p -7a ) and weekends on [email or web address]  He looks much better today. Breathing better. Volume status looks good. Renal function improved. RLE infection resolving. Creatinine getting back to baseline   General:  Sitting in bed  No resp difficulty HEENT: normal Neck: supple. JVP 6-7. Carotids 2+ bilat; no bruits. No lymphadenopathy or thryomegaly appreciated. Cor: PMI nondisplaced. Regular rate & rhythm. 2/6 TR Lungs: clear Abdomen: soft, nontender, nondistended. No hepatosplenomegaly. No bruits or masses. Good bowel sounds. Extremities: no cyanosis, clubbing, rash, edema RLE wrapped Neuro: alert & orientedx3, cranial nerves grossly intact. moves all 4 extremities  w/o difficulty. Affect pleasant  Overall much improved. Agree with Dr. Loleta Books that he can likely go home tomorrow if remains stable.   I had a long talk with patient and his son and daughter at the bedside regarding his overall condition and issues with compliance. Expressed my concern over his recent trajectory and prognosis over next 6-12 months  HF/PAH meds for d/c same as admit  meds except would hold torsemide until Monday and the start at 60 bid.   We will sign off. Please call with questions.   Total time spent 45 minutes. Over half that time spent discussing above.    Glori Bickers, MD  6:23 PM

## 2020-02-03 NOTE — Plan of Care (Signed)
  Problem: Clinical Measurements: Goal: Cardiovascular complication will be avoided Outcome: Progressing   Problem: Activity: Goal: Risk for activity intolerance will decrease Outcome: Progressing   Problem: Nutrition: Goal: Adequate nutrition will be maintained Outcome: Progressing   Problem: Skin Integrity: Goal: Risk for impaired skin integrity will decrease Outcome: Progressing

## 2020-02-03 NOTE — Plan of Care (Signed)

## 2020-02-03 NOTE — Progress Notes (Signed)
Patient is wondering if he needs cream on his BLE, unna boot is intact on RLE.

## 2020-02-03 NOTE — Progress Notes (Signed)
CrCl 27 ml/min>>change keflex to 550m PO TID with same stop date.  MOnnie Boer PharmD, BCIDP, AAHIVP, CPP Infectious Disease Pharmacist 02/03/2020 10:56 AM

## 2020-02-03 NOTE — Progress Notes (Signed)
PROGRESS NOTE    Darren Mueller  LSL:[id number] DOB: [birth year] DOA: 01/30/2020 PCP: Mosie Lukes, MD      Brief Narrative:  Darren Mueller a 69 y.o. M with hx pHTN, cirrhosis, dCHF, and CKD IIIb who presented with days progressive shortness of breath, leg swelling.  Mueller had recently been admitted for congestive heart failure.  Diuresed to 199lbs and discharged.,  Started to develop chills, then leg swelling, return with weight to 19 pounds, BNP >4500 and WBC 17K.  Started on antibiotics for cellulitis and IV Lasix.           Assessment & Plan:  Acute on chronic right heart failure Acute on chronic diastolic CHF and severe pulmonary hypertension Net -1800 cc recorded yesterday, creatinine better, K stable.  -Cardiology recommend transitioning to oral diuretic -Continue K supplement -Strict I/Os, daily weights, telemetry  -Daily monitoring renal function -Continue Xelexipag, macitentan and tadalafil -Consult HF team, appreciate expert cares    Right lower extremity cellulitis Admitted and started on broad spectrum IV antibiotics.  Redness around medial calf scab receded.  WBC normal, no fever.    No purulent component, doubt MRSA.  DVT Mueller considered unlikely.   -Continue Keflex for 3 more days -Continue unna boot per Cardiology  Cirrhosis Esophageal varices Severe thrombocytopenia Platelet count stably low.  No clinical bleeding.  AKI on CKD stage IIIb Nephropathy of congestion. Still improving.    Hypertension Coronary disease, secondary prevention -Continue rosuvastatin  COPD No evidence of bronchospasm.  Positive blood culture Single blood culture with coag negative staph.  I think Darren overwhelming likelihood Mueller that this Mueller a contaminant.  I will narrow antibiotics, but closer monitoring and also repeat blood cultures.          Disposition: Darren Mueller was admitted with severe acute congestive heart failure as well as  cellulitis.  His renal function continues to improve with diuresis.  Cardiology recommend transitioning to oral diuretics which we will do.  Given his high risk for readmission, it Mueller my medical opinion that at least 24 more hours hospital managemnet Mueller warranted to prevent deterioration and readmission.   PT pending.       MDM: Darren below labs and imaging reports reviewed and summarized above.  Medication management as above.  This Mueller a severe exacerbation of his chronic disease.   DVT prophylaxis: SCDs Code Status: Full code Family Communication:     Consultants:   Heart failure  Procedures:   3/20 echocardiogram  Antimicrobials:   Vancomycin and cefepime 3/29-3/31  Cephalexin 4/1>>  Culture data:   3/29 Single blood culture with coag negative staph, likely contaminant  4/1 blood culture x2 -- pending          Subjective: Mueller had no fever, nausea, vomiting.  He still has some diarrhea, he still Mueller some tenderness in Darren right leg, although overall this Mueller improved.  No orthopnea.  Still with a persistent cough.  No sputum.        Objective: Vitals:   02/02/20 1340 02/02/20 1342 02/02/20 2109 02/03/20 0624  BP: 114/73 114/73 100/66 99/62  Pulse: 71 69 82 76  Resp:  _0 Temp:   98.2 F (36.8 C) 97.8 F (36.6 C)  TempSrc:   Oral Oral  SpO2: 97% 98% 99% 99%  Weight:    86.3 kg  Height:        Intake/Output Summary (Last 24 hours) at 02/03/2020 1202 Last data filed at 02/03/2020  0800 Gross per 24 hour  Intake 600 ml  Output 1900 ml  Net -1300 ml   Filed Weights   02/01/20 0338 02/02/20 0225 02/03/20 0624  Weight: 89.3 kg 88.5 kg 86.3 kg    Examination: General appearance: Elderly, debilitated adult male, alert and in no acute distress.   HEENT: Anicteric, conjunctiva pink, lids and lashes normal. No nasal deformity, discharge, epistaxis.  Lips moist, teeth normal. OP hydrated, no oral lesions.   Skin: Warm and dry.  No suspicious  rashes or lesions.  Darren right leg Mueller now covered by an Haematologist. Cardiac: RRR, no murmurs appreciated.  Trace LE edema.    Respiratory: Normal respiratory rate and rhythm.  CTAB without rales or wheezes.  Rales in bilateral bases. Abdomen: Abdomen soft.  No tenderness palpation or guarding. No ascites, distension, hepatosplenomegaly.   MSK: No deformities or effusions of Darren large joints of Darren upper or lower extremities bilaterally.  Diffuse loss of subcutaneous muscle mass and fat. Neuro: Awake and alert. Naming Mueller grossly intact, and Darren Mueller's recall, recent and remote, as well as general fund of knowledge seem within normal limits.  Muscle tone normal, without fasciculations.  Moves all extremities equally and with normal coordination but generalized weakness.  Speech fluent.    Psych: Sensorium intact and responding to questions, attention normal. Affect normal.  Judgment and insight appear normal.      Data Reviewed: I have personally reviewed following labs and imaging studies:  CBC: Recent Labs  Lab 01/30/20 1605 01/31/20 0337 02/01/20 0442 02/02/20 0525 02/03/20 0553  WBC 17.9* 10.7* 7.5 4.0 2.2*  NEUTROABS 15.1* 8.8*  --   --   --   HGB 11.6* 9.9* 9.6* 9.5* 9.5*  HCT 38.3* 32.3* 31.3* 30.8* 30.9*  MCV 107.9* 108.0* 107.9* 105.8* 105.8*  PLT 32* 20* 20* 21* 21*   Basic Metabolic Panel: Recent Labs  Lab 01/30/20 1605 01/31/20 0337 02/01/20 0442 02/02/20 0525 02/03/20 0553  NA 134* 134* 132* 137 135  K 4.8 4.6 4.0 3.2* 3.5  CL 105 105 103 106 104  CO2 13* 15* 16* 21* 21*  GLUCOSE 116* 96 126* 99 91  BUN 57* 68* 73* 66* 61*  CREATININE 4.54* 4.41* 3.73* 3.10* 2.80*  CALCIUM 8.5* 8.0* 7.8* 8.1* 8.0*  PHOS  --   --  4.0  --   --    GFR: Estimated Creatinine Clearance: 27.7 mL/min (A) (by C-G formula based on SCr of 2.8 mg/dL (H)). Liver Function Tests: Recent Labs  Lab 01/30/20 1605 01/31/20 0337 02/01/20 0442 02/02/20 0525  AST 57* 42*  --  41  ALT  25 19  --  20  ALKPHOS 293* 217*  --  196*  BILITOT 4.4* 3.0*  --  2.1*  PROT 7.7 6.0*  --  6.0*  ALBUMIN 3.1* 2.5* 2.4* 2.4*   No results for input(s): LIPASE, AMYLASE in Darren last 168 hours. No results for input(s): AMMONIA in Darren last 168 hours. Coagulation Profile: No results for input(s): INR, PROTIME in Darren last 168 hours. Cardiac Enzymes: No results for input(s): CKTOTAL, CKMB, CKMBINDEX, TROPONINI in Darren last 168 hours. BNP (last 3 results) Recent Labs    10/11/19 1138 12/08/19 1041 01/11/20 1439  PROBNP 604.0* 710.0* 494.0*   HbA1C: No results for input(s): HGBA1C in Darren last 72 hours. CBG: No results for input(s): GLUCAP in Darren last 168 hours. Lipid Profile: No results for input(s): CHOL, HDL, LDLCALC, TRIG, CHOLHDL, LDLDIRECT in Darren last 72  hours. Thyroid Function Tests: Recent Labs    02/01/20 0442  TSH 1.805   Anemia Panel: No results for input(s): VITAMINB12, FOLATE, FERRITIN, TIBC, IRON, RETICCTPCT in Darren last 72 hours. Urine analysis:    Component Value Date/Time   COLORURINE YELLOW 11/18/2017 0250   APPEARANCEUR CLEAR 11/18/2017 0250   LABSPEC 1.008 11/18/2017 0250   PHURINE 5.0 11/18/2017 0250   GLUCOSEU NEGATIVE 11/18/2017 0250   HGBUR NEGATIVE 11/18/2017 0250   BILIRUBINUR neg 12/18/2017 1352   KETONESUR NEGATIVE 11/18/2017 0250   PROTEINUR net 12/18/2017 1352   PROTEINUR NEGATIVE 11/18/2017 0250   UROBILINOGEN 0.2 12/18/2017 1352   UROBILINOGEN 0.2 10/18/2013 1247   NITRITE neg 12/18/2017 1352   NITRITE NEGATIVE 11/18/2017 0250   LEUKOCYTESUR Negative 12/18/2017 1352   Sepsis Labs: _0 (procalcitonin:4,lacticacidven:4)  ) Recent Results (from Darren past 240 hour(s))  SARS CORONAVIRUS 2 (TAT 6-24 HRS) Nasopharyngeal Nasopharyngeal Swab     Status: None   Collection Time: 01/30/20  8:59 PM   Specimen: Nasopharyngeal Swab  Result Value Ref Range Status   SARS Coronavirus 2 NEGATIVE NEGATIVE Final    Comment: (NOTE) SARS-CoV-2 target  nucleic acids are NOT DETECTED. Darren SARS-CoV-2 RNA Mueller generally detectable in upper and lower respiratory specimens during Darren acute phase of infection. Negative results do not preclude SARS-CoV-2 infection, do not rule out co-infections with other pathogens, and should not be used as Darren sole basis for treatment or other Mueller management decisions. Negative results must be combined with clinical observations, Mueller history, and epidemiological information. Darren expected result Mueller Negative. Fact Sheet for Patients: SugarRoll.be Fact Sheet for Healthcare Providers: https://www.woods-mathews.com/ This test Mueller not yet approved or cleared by Darren [place] FDA and  has been authorized for detection and/or diagnosis of SARS-CoV-2 by FDA under an Emergency Use Authorization (EUA). This EUA will remain  in effect (meaning this test can be used) for Darren duration of Darren COVID-19 declaration under Section 56 4(b)(1) of Darren Act, 21 U.S.C. section 360bbb-3(b)(1), unless Darren authorization Mueller terminated or revoked sooner. Performed at [facility], [place] [address], [place], [place] [address]   Culture, blood (Routine X 2) w Reflex to ID Panel     Status: Abnormal   Collection Time: 01/30/20  9:25 PM   Specimen: BLOOD  Result Value Ref Range Status   Specimen Description BLOOD LEFT UPPER ARM  Final   Special Requests   Final    BOTTLES DRAWN AEROBIC AND ANAEROBIC Blood Culture adequate volume   Culture  Setup Time   Final    GRAM POSITIVE COCCI IN CLUSTERS AEROBIC BOTTLE ONLY CRITICAL RESULT CALLED TO, READ BACK BY AND VERIFIED WITHJiles Garter [facility] PHARMD [id number] 01/31/20 A BROWNING    Culture (A)  Final    STAPHYLOCOCCUS SPECIES (COAGULASE NEGATIVE) Darren SIGNIFICANCE OF ISOLATING THIS ORGANISM FROM A SINGLE SET OF BLOOD CULTURES WHEN MULTIPLE SETS ARE DRAWN Mueller UNCERTAIN. PLEASE NOTIFY Darren MICROBIOLOGY DEPARTMENT WITHIN ONE WEEK IF SPECIATION AND  SENSITIVITIES ARE REQUIRED. Performed at [facility], [place] [address], [place], [place] [address]    Report Status 02/01/2020 FINAL  Final         Radiology Studies: DG Chest 2 View  Result Date: 02/02/2020 CLINICAL DATA:  Follow-up of pneumonia. EXAM: CHEST - 2 VIEW COMPARISON:  01/30/2019 FINDINGS: Midline trachea. Mild cardiomegaly, accentuated by AP portable technique on Darren frontal radiograph. No pleural effusion or pneumothorax. Suspect mild pulmonary venous congestion. No lobar consolidation. Loop recorder. IMPRESSION: Cardiomegaly  with suspicion of mild pulmonary venous congestion. No lobar consolidation to suggest pneumonia. Electronically Signed   By: Abigail Miyamoto M.D.   On: 02/02/2020 09:14   CUP PACEART REMOTE DEVICE CHECK  Result Date: 02/02/2020 Carelink summary report received. Battery status OK. Normal device function. No new symptom episodes, tachy episodes, brady, or pause episodes. No new AF episodes. Monthly summary reports and ROV/PRN Kathy Breach, RN, CCDS, [employer]       Scheduled Meds: . acidophilus  1 capsule Oral Daily  . cephALEXin  500 mg Oral Q8H  . Ferrous Fumarate  1 tablet Oral Daily  . folic acid  1 mg Oral Daily  . furosemide  80 mg Intravenous BID  . macitentan  10 mg Oral Daily  . pantoprazole  40 mg Oral Daily  . potassium chloride  20 mEq Oral BID  . rosuvastatin  5 mg Oral q1800  . Selexipag  1,000 mcg Oral BID  . sodium chloride flush  3 mL Intravenous Q12H  . tadalafil  40 mg Oral Daily  . thiamine  100 mg Oral Daily   Continuous Infusions: . sodium chloride 10 mL/hr at 02/01/20 1547     LOS: 4 days    Time spent: 35 minutes    Edwin Dada, MD [facility] 02/03/2020, 12:02 PM     Please page though [place] or Epic secure chat:  For [employer], Adult nurse

## 2020-02-03 NOTE — Progress Notes (Signed)
CARDIAC REHAB PHASE I   PRE:  Rate/Rhythm: 83 SR  BP:  Supine: 110/64  Sitting:   Standing:    SaO2: 98%RA  MODE:  Ambulation: 190 ft   POST:  Rate/Rhythm: 91 SR  BP:  Supine:   Sitting: 114/71  Standing:    SaO2: 94%RA 1031-1102 Second walk today. Pt walked 190 ft with steady gait. Some SOB. Pt encouraged to purse lip breathe. Gave pt CHF booklet and discussed zones and when to call MD.  Encouraged daily weights, 2000 mg sodium restriction and 1.5 L FR. Pt given low sodium diets and encouraged to follow.  Discussed importance of daily weights. Will continue to follow.    Graylon Good, RN BSN  02/03/2020 10:58 AM

## 2020-02-03 NOTE — Progress Notes (Signed)
02/03/20 1200  Clinical Encounter Type  Visited With Health care provider  Visit Type Initial  Referral From Nurse  Consult/Referral To Chaplain   Chaplain is aware of need for AD. Chaplain does not have access to notary or volunteers today 02/04/2020. Chaplain will try again on Monday 02/07/2020 when proper staffing is available. Chaplains remain available for support as needs arise.   Chaplain Resident, Evelene Croon, M Div [phone] on-call pager

## 2020-02-03 NOTE — Evaluation (Signed)
Physical Therapy Evaluation Patient Details Name: Darren Mueller MRN: [id number] DOB: [birth year] Today's Date: 02/03/2020   History of Present Illness  Patient is a 69 year old male admitted with SOB, increased swelling of LEs. Admitted with CHF exacerbation. PMH includes: ETOH, CAD, COPD, HTN, HLD, cellulitis. He was here 1 week ago.  Clinical Impression  Patient received in bed, pleasant, agrees to PT evaluation. He reports he walked yesterday with cardiac rehab. He is independent with bed mobility and transfers. Requires min guard for ambulation of 150 feet without AD. He walks at very slow pace, steady without LOB. Reports no pain, or SOB during mobility. Patient will continue to benefit from skilled PT while here to improve activity tolerance and strength for safe return home.     Follow Up Recommendations No PT follow up    Equipment Recommendations  None recommended by PT    Recommendations for Other Services       Precautions / Restrictions Precautions Precautions: Fall Precaution Comments: mod fall Restrictions Weight Bearing Restrictions: No      Mobility  Bed Mobility Overal bed mobility: Independent             General bed mobility comments: HOB elevated but no assist required to EOB   Transfers Overall transfer level: Independent Equipment used: None Transfers: Sit to/from Stand Sit to Stand: Independent         General transfer comment: Stood from EOB without difficulty.  Ambulation/Gait Ambulation/Gait assistance: Min guard Gait Distance (Feet): 150 Feet Assistive device: None Gait Pattern/deviations: Step-through pattern;Shuffle Gait velocity: decreased   General Gait Details: very slow cadence, reports he feels steady. No LOB  Stairs            Wheelchair Mobility    Modified Rankin (Stroke Patients Only)       Balance Overall balance assessment: Mild deficits observed, not formally tested                                           Pertinent Vitals/Pain Pain Assessment: No/denies pain    Home Living Family/patient expects to be discharged to:: Private residence Living Arrangements: Children Available Help at Discharge: Family;Available 24 hours/day Type of Home: House Home Access: Stairs to enter Entrance Stairs-Rails: Can reach both Entrance Stairs-Number of Steps: 2 Home Layout: One level Home Equipment: None      Prior Function Level of Independence: Independent         Comments: patient drives     Hand Dominance   Dominant Hand: Right    Extremity/Trunk Assessment   Upper Extremity Assessment Upper Extremity Assessment: Generalized weakness    Lower Extremity Assessment Lower Extremity Assessment: Generalized weakness    Cervical / Trunk Assessment Cervical / Trunk Assessment: Normal  Communication   Communication: No difficulties  Cognition Arousal/Alertness: Awake/alert Behavior During Therapy: WFL for tasks assessed/performed Overall Cognitive Status: Within Functional Limits for tasks assessed                                        General Comments      Exercises     Assessment/Plan    PT Assessment Patient needs continued PT services  PT Problem List Decreased strength;Decreased activity tolerance;Decreased mobility       PT Treatment Interventions Gait  training;Stair training;Functional mobility training;Therapeutic activities;Patient/family education;Therapeutic exercise    PT Goals (Current goals can be found in the Care Plan section)  Acute Rehab PT Goals Patient Stated Goal: to return home PT Goal Formulation: With patient Time For Goal Achievement: 02/10/20 Potential to Achieve Goals: Good    Frequency Min 3X/week   Barriers to discharge        Co-evaluation               AM-PAC PT "6 Clicks" Mobility  Outcome Measure Help needed turning from your back to your side while in a flat bed without using  bedrails?: None Help needed moving from lying on your back to sitting on the side of a flat bed without using bedrails?: None Help needed moving to and from a bed to a chair (including a wheelchair)?: A Little Help needed standing up from a chair using your arms (e.g., wheelchair or bedside chair)?: None Help needed to walk in hospital room?: A Little Help needed climbing 3-5 steps with a railing? : A Little 6 Click Score: 21    End of Session   Activity Tolerance: Patient tolerated treatment well Patient left: in bed;with call bell/phone within reach Nurse Communication: Mobility status PT Visit Diagnosis: Difficulty in walking, not elsewhere classified (R26.2);Muscle weakness (generalized) (M62.81)    Time: 2956-2130 PT Time Calculation (min) (ACUTE ONLY): 25 min   Charges:   PT Evaluation $PT Eval Low Complexity: 1 Low PT Treatments $Gait Training: 8-22 mins        Ahmad Vanwey, PT, GCS 02/03/20,10:28 AM

## 2020-02-04 DIAGNOSIS — J449 Chronic obstructive pulmonary disease, unspecified: Secondary | ICD-10-CM

## 2020-02-04 LAB — BASIC METABOLIC PANEL
Anion gap: 12 (ref 5–15)
Anion gap: 13 (ref 5–15)
BUN: 57 mg/dL — ABNORMAL HIGH (ref 8–23)
BUN: 55 mg/dL — ABNORMAL HIGH (ref 8–23)
CO2: 22 mmol/L (ref 22–32)
CO2: 22 mmol/L (ref 22–32)
Calcium: 8.1 mg/dL — ABNORMAL LOW (ref 8.9–10.3)
Calcium: 8.4 mg/dL — ABNORMAL LOW (ref 8.9–10.3)
Chloride: 100 mmol/L (ref 98–111)
Chloride: 99 mmol/L (ref 98–111)
Creatinine, Ser: 2.77 mg/dL — ABNORMAL HIGH (ref 0.61–1.24)
Creatinine, Ser: 3.01 mg/dL — ABNORMAL HIGH (ref 0.61–1.24)
GFR calc Af Amer: 26 mL/min — ABNORMAL LOW (ref >60)
GFR calc Af Amer: 23 mL/min — ABNORMAL LOW (ref >60)
GFR calc non Af Amer: 22 mL/min — ABNORMAL LOW (ref >60)
GFR calc non Af Amer: 20 mL/min — ABNORMAL LOW (ref >60)
Glucose, Bld: 122 mg/dL — ABNORMAL HIGH (ref 70–99)
Glucose, Bld: 101 mg/dL — ABNORMAL HIGH (ref 70–99)
Potassium: 2.7 mmol/L — CL (ref 3.5–5.1)
Potassium: 3.5 mmol/L (ref 3.5–5.1)
Sodium: 134 mmol/L — ABNORMAL LOW (ref 135–145)
Sodium: 134 mmol/L — ABNORMAL LOW (ref 135–145)

## 2020-02-04 LAB — CBC
HCT: 29.4 % — ABNORMAL LOW (ref 39.0–52.0)
Hemoglobin: 9.3 g/dL — ABNORMAL LOW (ref 13.0–17.0)
MCH: 32.9 pg (ref 26.0–34.0)
MCHC: 31.6 g/dL (ref 30.0–36.0)
MCV: 103.9 fL — ABNORMAL HIGH (ref 80.0–100.0)
Platelets: 21 10*3/uL — CL (ref 150–400)
RBC: 2.83 MIL/uL — ABNORMAL LOW (ref 4.22–5.81)
RDW: 18.9 % — ABNORMAL HIGH (ref 11.5–15.5)
WBC: 2.1 10*3/uL — ABNORMAL LOW (ref 4.0–10.5)
nRBC: 0 % (ref 0.0–0.2)

## 2020-02-04 MED ORDER — POTASSIUM CHLORIDE CRYS ER 20 MEQ PO TBCR
40.0000 meq | EXTENDED_RELEASE_TABLET | Freq: Two times a day (BID) | ORAL | Status: AC
  Administered 2020-02-04: 40 meq via ORAL
  Filled 2020-02-04: qty 2

## 2020-02-04 MED ORDER — POTASSIUM CHLORIDE CRYS ER 20 MEQ PO TBCR
40.0000 meq | EXTENDED_RELEASE_TABLET | Freq: Once | ORAL | Status: AC
  Administered 2020-02-04: 40 meq via ORAL
  Filled 2020-02-04: qty 2

## 2020-02-04 MED ORDER — POTASSIUM CHLORIDE CRYS ER 20 MEQ PO TBCR
40.0000 meq | EXTENDED_RELEASE_TABLET | Freq: Two times a day (BID) | ORAL | Status: DC
  Administered 2020-02-04: 40 meq via ORAL
  Filled 2020-02-04: qty 2

## 2020-02-04 NOTE — Progress Notes (Signed)
Potassium 2.7, Provider notified.

## 2020-02-04 NOTE — Progress Notes (Signed)
CARDIAC REHAB PHASE I   PRE:  Rate/Rhythm: 79 SR  BP:  Supine:   Sitting: 112/66  Standing:    SaO2: 95% RA  MODE:  Ambulation: 200 ft   POST:  Rate/Rhythm: 88 SR  BP:  Supine:   Sitting: 120/68  Standing:    SaO2: 96% RA Weak, but tolerated well independently, held onto handrail in hall.  Encouraged to walk tid daily with nursing staff.  Liliane Channel RN, BSN 02/04/2020 11:58 AM

## 2020-02-04 NOTE — Progress Notes (Signed)
PROGRESS NOTE    Darren Mueller  TKW:[id number] DOB: Apr 12, 1951 DOA: 01/30/2020 PCP: Mosie Lukes, MD  Brief Narrative:  Darren Mueller is a 69 y.o. M with hx pHTN, cirrhosis, dCHF, and CKD IIIb who presented with days progressive shortness of breath, leg swelling.  Patient had recently been admitted for congestive heart failure.  Diuresed to 199lbs and discharged.,  Started to develop chills, then leg swelling, return with weight to 19 pounds, BNP >4500 and WBC 17K.  Started on antibiotics for cellulitis and IV Lasix.   Assessment & Plan:  Acute on chronic right heart failure Acute on chronic diastolic CHF  Severe pulmonary hypertension -Clinically improving, diuresed with IV Lasix initially -Followed by cardiology -Transition to oral diuretics and KCl -Potassium critically low at 2.7 today, replete -Recheck labs this afternoon -Also continue pulmonary hypertension meds including Xelexipag, macitentan and tadalafil -Close follow-up with heart failure team post discharge  Right lower extremity cellulitis Admitted and started on broad spectrum IV antibiotics.  Redness around medial calf scab receded.  WBC normal, no fever.   - no purulent component, doubt MRSA.  DVT is considered unlikely.   -Continue Keflex for 3 more days -Continue unna boot   Severe hypokalemia -See above, replace, repeat BMP this afternoon  Cirrhosis Esophageal varices Severe thrombocytopenia Platelet count stably low.  No clinical bleeding.  AKI on CKD stage IIIb -Cardiorenal, kidney function, creatinine was 4.1 on admission, trending down to 2.7 now -Likely cardiorenal -Followed by Dr. Arty Baumgartner at [facility] -Baseline creatinine around 2.5-3  Hypertension Coronary disease, secondary prevention -Continue rosuvastatin  COPD No evidence of bronchospasm.  Coag negative Staphylococcus in 1 out of 2 blood cultures  -Most consistent with contamination  -Repeat cultures negative  thus far  DVT prophylaxis: SCDs Code Status: Full code Disposition: Home pending improvement in severe hypokalemia and volume status  Consultants:   Heart failure  Procedures:   3/20 echocardiogram  Antimicrobials:   Vancomycin and cefepime 3/29-3/31  Cephalexin 4/1>>  Culture data:   3/29 Single blood culture with coag negative staph, likely contaminant  4/1 blood culture x2 -- pending  Subjective: -Feels better today, breathing improving, getting closer to baseline  Objective: Vitals:   02/03/20 1758 02/03/20 2110 02/04/20 0005 02/04/20 0429  BP: 113/68 (!) 97/59  102/65  Pulse:  77  81  Resp:  19  19  Temp:  97.7 F (36.5 C)  98.4 F (36.9 C)  TempSrc:  Oral  Oral  SpO2:  98%  96%  Weight:   85.9 kg   Height:        Intake/Output Summary (Last 24 hours) at 02/04/2020 1206 Last data filed at 02/04/2020 1019 Gross per 24 hour  Intake 1240 ml  Output 1525 ml  Net -285 ml   Filed Weights   02/02/20 0225 02/03/20 0624 02/04/20 0005  Weight: 88.5 kg 86.3 kg 85.9 kg    Examination: Gen: Elderly pleasant male sitting up in bed, AAOx3, no distress HEENT: PERRLA, Neck supple, no JVD Lungs: Clear CVS: RRR,No Gallops,Rubs or new Murmurs Abd: soft, Non tender, non distended, BS present Extremities: Trace edema  skin: no new rashes  Data Reviewed: I have personally reviewed following labs and imaging studies:  CBC: Recent Labs  Lab 01/30/20 1605 01/30/20 1605 01/31/20 0337 02/01/20 0442 02/02/20 0525 02/03/20 0553 02/04/20 0505  WBC 17.9*   < > 10.7* 7.5 4.0 2.2* 2.1*  NEUTROABS 15.1*  --  8.8*  --   --   --   --  HGB 11.6*   < > 9.9* 9.6* 9.5* 9.5* 9.3*  HCT 38.3*   < > 32.3* 31.3* 30.8* 30.9* 29.4*  MCV 107.9*   < > 108.0* 107.9* 105.8* 105.8* 103.9*  PLT 32*   < > 20* 20* 21* 21* 21*   < > = values in this interval not displayed.   Basic Metabolic Panel: Recent Labs  Lab 01/31/20 0337 02/01/20 0442 02/02/20 0525 02/03/20 0553  02/04/20 0505  NA 134* 132* 137 135 134*  K 4.6 4.0 3.2* 3.5 2.7*  CL 105 103 106 104 100  CO2 15* 16* 21* 21* 22  GLUCOSE 96 126* 99 91 122*  BUN 68* 73* 66* 61* 57*  CREATININE 4.41* 3.73* 3.10* 2.80* 2.77*  CALCIUM 8.0* 7.8* 8.1* 8.0* 8.1*  PHOS  --  4.0  --   --   --    GFR: Estimated Creatinine Clearance: 28 mL/min (A) (by C-G formula based on SCr of 2.77 mg/dL (H)). Liver Function Tests: Recent Labs  Lab 01/30/20 1605 01/31/20 0337 02/01/20 0442 02/02/20 0525  AST 57* 42*  --  41  ALT 25 19  --  20  ALKPHOS 293* 217*  --  196*  BILITOT 4.4* 3.0*  --  2.1*  PROT 7.7 6.0*  --  6.0*  ALBUMIN 3.1* 2.5* 2.4* 2.4*   No results for input(s): LIPASE, AMYLASE in the last 168 hours. No results for input(s): AMMONIA in the last 168 hours. Coagulation Profile: No results for input(s): INR, PROTIME in the last 168 hours. Cardiac Enzymes: No results for input(s): CKTOTAL, CKMB, CKMBINDEX, TROPONINI in the last 168 hours. BNP (last 3 results) Recent Labs    10/11/19 1138 12/08/19 1041 01/11/20 1439  PROBNP 604.0* 710.0* 494.0*   HbA1C: No results for input(s): HGBA1C in the last 72 hours. CBG: No results for input(s): GLUCAP in the last 168 hours. Lipid Profile: No results for input(s): CHOL, HDL, LDLCALC, TRIG, CHOLHDL, LDLDIRECT in the last 72 hours. Thyroid Function Tests: No results for input(s): TSH, T4TOTAL, FREET4, T3FREE, THYROIDAB in the last 72 hours. Anemia Panel: No results for input(s): VITAMINB12, FOLATE, FERRITIN, TIBC, IRON, RETICCTPCT in the last 72 hours. Urine analysis:    Component Value Date/Time   COLORURINE YELLOW 11/18/2017 0250   APPEARANCEUR CLEAR 11/18/2017 0250   LABSPEC 1.008 11/18/2017 0250   PHURINE 5.0 11/18/2017 0250   GLUCOSEU NEGATIVE 11/18/2017 0250   HGBUR NEGATIVE 11/18/2017 0250   BILIRUBINUR neg 12/18/2017 1352   KETONESUR NEGATIVE 11/18/2017 0250   PROTEINUR net 12/18/2017 1352   PROTEINUR NEGATIVE 11/18/2017 0250    UROBILINOGEN 0.2 12/18/2017 1352   UROBILINOGEN 0.2 10/18/2013 1247   NITRITE neg 12/18/2017 1352   NITRITE NEGATIVE 11/18/2017 0250   LEUKOCYTESUR Negative 12/18/2017 1352   Sepsis Labs: _0 (procalcitonin:4,lacticacidven:4)  ) Recent Results (from the past 240 hour(s))  SARS CORONAVIRUS 2 (TAT 6-24 HRS) Nasopharyngeal Nasopharyngeal Swab     Status: None   Collection Time: 01/30/20  8:59 PM   Specimen: Nasopharyngeal Swab  Result Value Ref Range Status   SARS Coronavirus 2 NEGATIVE NEGATIVE Final    Comment: (NOTE) SARS-CoV-2 target nucleic acids are NOT DETECTED. The SARS-CoV-2 RNA is generally detectable in upper and lower respiratory specimens during the acute phase of infection. Negative results do not preclude SARS-CoV-2 infection, do not rule out co-infections with other pathogens, and should not be used as the sole basis for treatment or other patient management decisions. Negative results must be combined with clinical observations,  patient history, and epidemiological information. The expected result is Negative. Fact Sheet for Patients: SugarRoll.be Fact Sheet for Healthcare Providers: https://www.woods-mathews.com/ This test is not yet approved or cleared by the [place] FDA and  has been authorized for detection and/or diagnosis of SARS-CoV-2 by FDA under an Emergency Use Authorization (EUA). This EUA will remain  in effect (meaning this test can be used) for the duration of the COVID-19 declaration under Section 56 4(b)(1) of the Act, 21 U.S.C. section 360bbb-3(b)(1), unless the authorization is terminated or revoked sooner. Performed at [facility], [place] [address], [place], [place] [address]   Culture, blood (Routine X 2) w Reflex to ID Panel     Status: Abnormal   Collection Time: 01/30/20  9:25 PM   Specimen: BLOOD  Result Value Ref Range Status   Specimen Description BLOOD LEFT UPPER ARM  Final    Special Requests   Final    BOTTLES DRAWN AEROBIC AND ANAEROBIC Blood Culture adequate volume   Culture  Setup Time   Final    GRAM POSITIVE COCCI IN CLUSTERS AEROBIC BOTTLE ONLY CRITICAL RESULT CALLED TO, READ BACK BY AND VERIFIED WITHJiles Garter [facility] PHARMD [id number] 01/31/20 A BROWNING    Culture (A)  Final    STAPHYLOCOCCUS SPECIES (COAGULASE NEGATIVE) THE SIGNIFICANCE OF ISOLATING THIS ORGANISM FROM A SINGLE SET OF BLOOD CULTURES WHEN MULTIPLE SETS ARE DRAWN IS UNCERTAIN. PLEASE NOTIFY THE MICROBIOLOGY DEPARTMENT WITHIN ONE WEEK IF SPECIATION AND SENSITIVITIES ARE REQUIRED. Performed at [facility], [place] [address] [place], [place] [address]    Report Status 02/01/2020 FINAL  Final  Culture, blood (routine x 2)     Status: None (Preliminary result)   Collection Time: 02/02/20  8:14 AM   Specimen: BLOOD  Result Value Ref Range Status   Specimen Description BLOOD LEFT ANTECUBITAL  Final   Special Requests   Final    AEROBIC BOTTLE ONLY Blood Culture results may not be optimal due to an inadequate volume of blood received in culture bottles   Culture   Final    NO GROWTH 1 DAY Performed at [facility], [place] [address], [place], [place] [address]    Report Status PENDING  Incomplete  Culture, blood (routine x 2)     Status: None (Preliminary result)   Collection Time: 02/02/20  8:18 AM   Specimen: BLOOD LEFT HAND  Result Value Ref Range Status   Specimen Description BLOOD LEFT HAND  Final   Special Requests AEROBIC BOTTLE ONLY Blood Culture adequate volume  Final   Culture   Final    NO GROWTH 1 DAY Performed at [facility], [place] [address] [place], [place] [address]    Report Status PENDING  Incomplete         Radiology Studies: No results found.      Scheduled Meds: . acidophilus  1 capsule Oral Daily  . cephALEXin  500 mg Oral Q8H  . Ferrous Fumarate  1 tablet Oral Daily  . folic acid  1 mg Oral Daily  . macitentan  10 mg Oral Daily  .  pantoprazole  40 mg Oral Daily  . potassium chloride  40 mEq Oral BID  . rosuvastatin  5 mg Oral q1800  . Selexipag  1,000 mcg Oral BID  . sodium chloride flush  3 mL Intravenous Q12H  . tadalafil  40 mg Oral Daily  . thiamine  100 mg Oral Daily  . torsemide  60 mg Oral BID   Continuous Infusions: . sodium chloride 10 mL/hr at 02/01/20 1547     LOS: 5 days   Time spent: 35 minutes  Domenic Polite, MD [facility] 02/04/2020, 12:06 PM

## 2020-02-05 LAB — CBC
HCT: 32.1 % — ABNORMAL LOW (ref 39.0–52.0)
Hemoglobin: 10 g/dL — ABNORMAL LOW (ref 13.0–17.0)
MCH: 32.3 pg (ref 26.0–34.0)
MCHC: 31.2 g/dL (ref 30.0–36.0)
MCV: 103.5 fL — ABNORMAL HIGH (ref 80.0–100.0)
Platelets: 24 K/uL — CL (ref 150–400)
RBC: 3.1 MIL/uL — ABNORMAL LOW (ref 4.22–5.81)
RDW: 19 % — ABNORMAL HIGH (ref 11.5–15.5)
WBC: 2.2 K/uL — ABNORMAL LOW (ref 4.0–10.5)
nRBC: 0 % (ref 0.0–0.2)

## 2020-02-05 LAB — BASIC METABOLIC PANEL WITH GFR
Anion gap: 11 (ref 5–15)
BUN: 54 mg/dL — ABNORMAL HIGH (ref 8–23)
CO2: 24 mmol/L (ref 22–32)
Calcium: 8.3 mg/dL — ABNORMAL LOW (ref 8.9–10.3)
Chloride: 100 mmol/L (ref 98–111)
Creatinine, Ser: 2.7 mg/dL — ABNORMAL HIGH (ref 0.61–1.24)
GFR calc Af Amer: 26 mL/min — ABNORMAL LOW
GFR calc non Af Amer: 23 mL/min — ABNORMAL LOW
Glucose, Bld: 92 mg/dL (ref 70–99)
Potassium: 3.3 mmol/L — ABNORMAL LOW (ref 3.5–5.1)
Sodium: 135 mmol/L (ref 135–145)

## 2020-02-05 MED ORDER — POTASSIUM CHLORIDE CRYS ER 20 MEQ PO TBCR
40.0000 meq | EXTENDED_RELEASE_TABLET | Freq: Two times a day (BID) | ORAL | Status: DC
  Administered 2020-02-05: 40 meq via ORAL
  Filled 2020-02-05: qty 2

## 2020-02-05 MED ORDER — POTASSIUM CHLORIDE ER 20 MEQ PO TBCR: 40.0000 meq | EXTENDED_RELEASE_TABLET | Freq: Two times a day (BID) | ORAL | Status: AC

## 2020-02-05 MED ORDER — CEPHALEXIN 500 MG PO CAPS: 500.0000 mg | ORAL_CAPSULE | Freq: Three times a day (TID) | ORAL | 0 refills | Status: AC

## 2020-02-05 NOTE — TOC Transition Note (Signed)
Transition of Care [facility]) - CM/SW Discharge Note   Patient Details  Name: Darren Mueller MRN: [id number] Date of Birth: 10-20-1951  Transition of Care [facility]) CM/SW Contact:  Pollie Friar, RN Phone Number: 02/05/2020, 11:56 AM   Clinical Narrative:    Pt discharging home with [facility] services continuing through [facility]. Britney with [facility] aware of d/c and resumption orders.  Pts son and daughter to provide supervision at home.  Family providing transport.    Final next level of care: Home w Home Health Services Barriers to Discharge: No Barriers Identified   Patient Goals and CMS Choice   CMS Medicare.gov Compare Post Acute Care list provided to:: Patient Choice offered to / list presented to : Patient  Discharge Placement                       Discharge Plan and Services                          HH Arranged: RN [facility] Agency: [facility] Date [place]: 02/05/20   Representative spoke with at [place]: [place] ([place]) Interventions     Readmission Risk Interventions Readmission Risk Prevention Plan 01/23/2020 05/17/2019 02/14/2019  Transportation Screening Complete Complete Complete  PCP or Specialist Appt within 3-5 Days Complete Complete Not Complete  Not Complete comments - - CM had to leave VM for office staff - requesting appt  [place] or [place] Complete Complete Complete  Social Work Consult for [place] Planning/Counseling Complete Complete Complete  Palliative Care Screening Not Applicable Not Applicable Not Applicable  Medication Review (RN Care Manager) Complete Referral to Pharmacy Referral to Pharmacy  Some recent data might be hidden

## 2020-02-06 ENCOUNTER — Telehealth: Payer: Self-pay | Admitting: *Deleted

## 2020-02-06 NOTE — Telephone Encounter (Signed)
1st attempt. Unable to reach patient.   

## 2020-02-07 LAB — CULTURE, BLOOD (ROUTINE X 2)
Culture: NO GROWTH
Culture: NO GROWTH
Special Requests: ADEQUATE

## 2020-02-07 NOTE — Telephone Encounter (Signed)
I have made two attempts and have been unable to reach patient. Pt has hospital follow up scheduled w/ PCP 02/10/20 _0 .

## 2020-02-08 ENCOUNTER — Other Ambulatory Visit: Payer: Self-pay | Admitting: *Deleted

## 2020-02-08 NOTE — Patient Outreach (Signed)
[facility]) Care Management  02/08/2020  Darren Mueller 11/15/1950 [id number]    Telephone Assessment Received referral 4/5 Initial outreach 4/7 Primary provider to completed transition of care  RN attempted outreach call however unsuccessful. RN able to leave a HIPAA approved voice message requesting a call back.  Plan: Will scheduled another outreach over the next week for pending services.  [name], RN Care Management Coordinator [place] Office [phone]

## 2020-02-09 ENCOUNTER — Telehealth: Payer: Self-pay | Admitting: *Deleted

## 2020-02-09 ENCOUNTER — Other Ambulatory Visit: Payer: Self-pay | Admitting: *Deleted

## 2020-02-09 ENCOUNTER — Other Ambulatory Visit: Payer: Self-pay

## 2020-02-09 ENCOUNTER — Ambulatory Visit (HOSPITAL_COMMUNITY)
Admit: 2020-02-09 | Discharge: 2020-02-09 | Disposition: A | Discharge status: Home / Self Care | Payer: Medicare Other | Source: Ambulatory Visit | Attending: Adult Health | Admitting: Adult Health

## 2020-02-09 VITALS — BP 106/68 | HR 89 | Wt 186.6 lb

## 2020-02-09 DIAGNOSIS — I2729 Other secondary pulmonary hypertension: Secondary | ICD-10-CM

## 2020-02-09 DIAGNOSIS — I5032 Chronic diastolic (congestive) heart failure: Secondary | ICD-10-CM

## 2020-02-09 DIAGNOSIS — K766 Portal hypertension: Secondary | ICD-10-CM

## 2020-02-09 DIAGNOSIS — Z79899 Other long term (current) drug therapy: Secondary | ICD-10-CM | POA: Diagnosis not present

## 2020-02-09 DIAGNOSIS — G4733 Obstructive sleep apnea (adult) (pediatric): Secondary | ICD-10-CM | POA: Insufficient documentation

## 2020-02-09 DIAGNOSIS — I2510 Atherosclerotic heart disease of native coronary artery without angina pectoris: Secondary | ICD-10-CM | POA: Insufficient documentation

## 2020-02-09 DIAGNOSIS — D61818 Other pancytopenia: Secondary | ICD-10-CM | POA: Diagnosis not present

## 2020-02-09 DIAGNOSIS — R55 Syncope and collapse: Secondary | ICD-10-CM | POA: Insufficient documentation

## 2020-02-09 DIAGNOSIS — R569 Unspecified convulsions: Secondary | ICD-10-CM | POA: Diagnosis not present

## 2020-02-09 DIAGNOSIS — Z8249 Family history of ischemic heart disease and other diseases of the circulatory system: Secondary | ICD-10-CM | POA: Insufficient documentation

## 2020-02-09 DIAGNOSIS — J449 Chronic obstructive pulmonary disease, unspecified: Secondary | ICD-10-CM | POA: Diagnosis not present

## 2020-02-09 DIAGNOSIS — R519 Headache, unspecified: Secondary | ICD-10-CM | POA: Insufficient documentation

## 2020-02-09 DIAGNOSIS — E039 Hypothyroidism, unspecified: Secondary | ICD-10-CM | POA: Insufficient documentation

## 2020-02-09 DIAGNOSIS — Z87891 Personal history of nicotine dependence: Secondary | ICD-10-CM | POA: Diagnosis not present

## 2020-02-09 DIAGNOSIS — I130 Hypertensive heart and chronic kidney disease with heart failure and stage 1 through stage 4 chronic kidney disease, or unspecified chronic kidney disease: Secondary | ICD-10-CM | POA: Insufficient documentation

## 2020-02-09 DIAGNOSIS — K219 Gastro-esophageal reflux disease without esophagitis: Secondary | ICD-10-CM | POA: Diagnosis not present

## 2020-02-09 DIAGNOSIS — M109 Gout, unspecified: Secondary | ICD-10-CM | POA: Diagnosis not present

## 2020-02-09 DIAGNOSIS — E781 Pure hyperglyceridemia: Secondary | ICD-10-CM | POA: Insufficient documentation

## 2020-02-09 DIAGNOSIS — I50810 Right heart failure, unspecified: Secondary | ICD-10-CM | POA: Diagnosis not present

## 2020-02-09 DIAGNOSIS — I5033 Acute on chronic diastolic (congestive) heart failure: Secondary | ICD-10-CM | POA: Diagnosis not present

## 2020-02-09 DIAGNOSIS — I2721 Secondary pulmonary arterial hypertension: Secondary | ICD-10-CM | POA: Insufficient documentation

## 2020-02-09 DIAGNOSIS — N184 Chronic kidney disease, stage 4 (severe): Secondary | ICD-10-CM | POA: Diagnosis not present

## 2020-02-09 LAB — BASIC METABOLIC PANEL
Anion gap: 11 (ref 5–15)
BUN: 49 mg/dL — ABNORMAL HIGH (ref 8–23)
CO2: 23 mmol/L (ref 22–32)
Calcium: 8.4 mg/dL — ABNORMAL LOW (ref 8.9–10.3)
Chloride: 100 mmol/L (ref 98–111)
Creatinine, Ser: 2.62 mg/dL — ABNORMAL HIGH (ref 0.61–1.24)
GFR calc Af Amer: 27 mL/min — ABNORMAL LOW (ref >60)
GFR calc non Af Amer: 24 mL/min — ABNORMAL LOW (ref >60)
Glucose, Bld: 124 mg/dL — ABNORMAL HIGH (ref 70–99)
Potassium: 3.7 mmol/L (ref 3.5–5.1)
Sodium: 134 mmol/L — ABNORMAL LOW (ref 135–145)

## 2020-02-09 MED ORDER — SUMATRIPTAN SUCCINATE 50 MG PO TABS: 50.0000 mg | ORAL_TABLET | Freq: Every day | ORAL | 1 refills | Status: DC | PRN

## 2020-02-09 MED FILL — SUMATRIPTAN SUCC 50 MG TAB: 50 | 10 days supply | Qty: 10 | Fill #0

## 2020-02-09 NOTE — Progress Notes (Signed)
PCP: Primary Cardiologist: Dr Haroldine Laws   HPI: Darren Mueller is a 69  year old with a history of COPD, PAH, RV failure, cirrhosis, and ETOH abuse.    Admitted in 6/17 with CP and SOB. Underwent R/L cath. Minimal CAD with moderate PAH and preserved cardiac output.   Admitted 06/2016 and 07/2016 with syncope. On September admission found to have elevated ETOH level as well as R 6th rib fracture. Wore 30 day monitor up until 08/28/16. No AF noted. No events.   Admitted February2018after syncopal event. LINQ placed.   Was admitted 4/20 with hematochezia and melena. His hemoglobin remained stable. No episodes of GI bleed after hospitalization. Underwent EGD by GI with finding of Grade 1 and a small esophageal varices, no evidence of recent bleeding,portal hypertensive gastropathy. Started on nadolol which made him very dizzy. He stopped this and feels a lot  Admitted 7/13-16/20 with volume overload. Diuresed 21 pounds. D/c weight 192.  Echo 05/17/19: LVEF >65% RV severely dilated with moderate RV dysfunction. Moderate TR RVSP 87mHG. While in hospital was pancytopenic. Possibility of BMBx discussed with Dr. SAlen Blewbut felt it was not necessary as it was felt he had splenic sequestration and counts were low but very stable.  On triple therapy for PMarionUptravi Macitentan  Adcirca   Admitted in March with increased shortness of breath. Diuresed with IV lasix and later transitioned to torsemide 60 mg twice a day. Hospital course complicated by AKI. Discharged on 01/26/20 with weight 199 pounds.    [facility]. Weight has gone  up 20 pounds since discharge.Says he has been taking all medications.  Admitted with cellulitis and A/C RV failure. Diuresed with IV lasix and transitioned to torsemide.  Discharge weight 184 pounds.   [facility] Overall feeling ok. Mild SOB with exertion. No syncope/presyncope.  Denies PND/Orthopnea. Appetite ok. No fever or chills. Weight at home 182-184  pounds. Having some leg edema. Taking all medications. Followed by [facility].   Studies: ECHO 12/14 EF 55-60% Peak PA pressure 35. Severe RV dysfunction  ECHO 7/15 EF 60% RV moderately to severely dilated. Moderate HK RVSP 525mHG  ECHO 6/16 EF 60% RV moderately to severely dilated. Severe HK RVSP ~65 mm HG. D-shaped septum Echo 2/17 LVEF 60-65% RV massively dilated. Flat septum. Severe HK. Moderate TR RVSP ~65. IVC small. No effusion  ECHO 01/01/2017 EF 50-55% Grade I DD. RV severely dilated. Peak PA pressure 62 mm hg Echo 9/19 with EF 60-65% RV markedly dilated and hypokinetic with D-shaped septum. RVSP 6868m Echo 01/2020 EF 60-65% Grade IIDD, RVSP 83  ROS: All systems negative except as listed in HPI, PMH and Problem List.  SH:  Social History   Socioeconomic History  . Marital status: Divorced    Spouse name: Not on file  . Number of children: 3  . Years of education: Not on file  . Highest education level: Not on file  Occupational History  . Occupation: Retired    Comment: AirSocial research officer, governmentobacco Use  . Smoking status: Former Smoker    Packs/day: 2.00    Years: 5.00    Pack years: 10.00    Types: Cigarettes    Quit date: 09/22/1979    Years since quitting: 40.4  . Smokeless tobacco: Never Used  Substance and Sexual Activity  . Alcohol use: Yes    Alcohol/week: 6.0 standard drinks    Types: 6  Shots of liquor per week    Comment: social drinker  . Drug use: No  . Sexual activity: Yes    Birth control/protection: Spermicide  Other Topics Concern  . Not on file  Social History Narrative   0 caffeine drinks daily    Social Determinants of Health   Financial Resource Strain: Low Risk   . Difficulty of Paying Living Expenses: Not hard at all  Food Insecurity: No Food Insecurity  . Worried About Charity fundraiser in the Last Year: Never true  . Ran Out of Food in the Last Year: Never true    Transportation Needs: No Transportation Needs  . Lack of Transportation (Medical): No  . Lack of Transportation (Non-Medical): No  Physical Activity: Inactive  . Days of Exercise per Week: 0 days  . Minutes of Exercise per Session: 0 min  Stress: Stress Concern Present  . Feeling of Stress : Rather much  Social Connections: Slightly Isolated  . Frequency of Communication with Friends and Family: More than three times a week  . Frequency of Social Gatherings with Friends and Family: Three times a week  . Attends Religious Services: More than 4 times per year  . Active Member of Clubs or Organizations: Yes  . Attends Archivist Meetings: More than 4 times per year  . Marital Status: Divorced  Human resources officer Violence:   . Fear of Current or Ex-Partner:   . Emotionally Abused:   Marland Kitchen Physically Abused:   . Sexually Abused:     FH:  Family History  Problem Relation Age of Onset  . Heart disease Mother   . Heart attack Mother   . Hypertension Mother   . Kidney failure Mother   . Liver disease Mother        stage 4 liver disease  . Prostate cancer Father   . Cancer Father        lung  . Hypertension Brother   . Gout Brother   . Alcoholism Maternal Grandfather   . Liver disease Maternal Grandfather   . Migraines Sister   . Hypertension Brother   . Colon cancer Neg Hx     Past Medical History:  Diagnosis Date  . Alcohol dependence ([place]) 03/22/2011   In remission   . Alcohol dependence in remission ([place]) 03/22/2011   In remission   . Alcoholic cirrhosis of liver with ascites ([place]) 2014  . Anginal pain ([place])    with SOB admitted 04/20/2019  . Cardiomegaly 02/2019   Noted on CXR  . CHF (congestive heart failure) ([place])   . CKD (chronic kidney disease), stage III 07/18/2013  . COPD (chronic obstructive pulmonary disease) ([place])   . Coronary artery disease   . Dermatitis 06/10/2015  . Diarrhea 09/04/2013  . Dyspnea    admitted 04/20/2019  . Elbow pain, left 03/17/2017   . Eustachian tube dysfunction   . GERD (gastroesophageal reflux disease)   . Gout   . Headache   . Hearing loss   . Heart murmur   . Hx of colonic polyps   . Hypertension   . Hypertriglyceridemia 03/17/2017  . Hypopotassemia   . Otalgia 11/28/2013  . Other chronic pulmonary heart diseases   . PAH (pulmonary artery hypertension) (H[facility])    RV failure  . Pancytopenia ([place])   . Pleural effusion 2014   Noted on CT:   Moderately large simple layering right pleural effusion   . Preventative health care 06/29/2016  . Pulmonary nodule 2014  Noted on CT: An 8 mm pulmonary nodule in the periphery of the left lower lobe  . Rectal bleeding   . Red eye 03/03/2016   right  . Seizures ([place])   . Sleep apnea    does not wear CPAP  . Splenomegaly 12/2015   Noted on CT  . Thoracic aorta atherosclerosis ([place]) 12/2017  . Thrombocytopenia ([place]) 06/29/2016  . Unspecified hypothyroidism 01/25/2013  . Unspecified pleural effusion   . Vertigo   . Wears glasses     Current Outpatient Medications  Medication Sig Dispense Refill  . acetaminophen (TYLENOL) 500 MG tablet Take 1,000 mg by mouth every 6 (six) hours as needed (for pain.).    Marland Kitchen allopurinol (ZYLOPRIM) 100 MG tablet Take 2 tablets (200 mg total) by mouth daily. 180 tablet 1  . Blood Pressure Monitoring (BLOOD PRESSURE CUFF) MISC Use as directed. Check blood pressure bid prn Dx:I10 1 each 0  . cephALEXin (KEFLEX) 500 MG capsule Take 1 capsule (500 mg total) by mouth every 8 (eight) hours for 4 days. 12 capsule 0  . diphenoxylate-atropine (LOMOTIL) 2.5-0.025 MG tablet Take 1 tablet by mouth 4 (four) times daily as needed for diarrhea or loose stools. 40 tablet 1  . Ferrous Fumarate (HEMOCYTE) 324 (106 Fe) MG TABS tablet Take 1 tablet (106 mg of iron total) by mouth daily. 30 tablet 5  . folic acid (FOLVITE) 1 MG tablet Take 1 tablet (1 mg total) by mouth daily. 30 tablet 5  . hyoscyamine (LEVSIN SL) 0.125 MG SL tablet Place 1 tablet (0.125 mg total)  under the tongue every 6 (six) hours as needed. (Patient taking differently: Place 0.125 mg under the tongue every 6 (six) hours as needed for cramping. ) 30 tablet 1  . macitentan (OPSUMIT) 10 MG tablet Take 1 tablet (10 mg total) by mouth daily. 90 tablet 3  . Multiple Vitamin (MULTIVITAMIN) tablet Take 1 tablet by mouth daily.    Marland Kitchen omeprazole (PRILOSEC) 40 MG capsule TAKE 1 CAPSULE BY MOUTH ONCE DAILY (Patient taking differently: Take 40 mg by mouth daily. ) 90 capsule 0  . Potassium Chloride ER 20 MEQ TBCR Take 40 mEq by mouth 2 (two) times daily.    . rosuvastatin (CRESTOR) 5 MG tablet TAKE 1 TABLET DAILY (Patient taking differently: Take 5 mg by mouth daily at 6 PM. ) 90 tablet 3  . Selexipag (UPTRAVI) 1000 MCG TABS Take 1,000 mcg by mouth 2 (two) times daily. 60 tablet 11  . tadalafil, PAH, (ADCIRCA) 20 MG tablet Take 2 tablets (40 mg total) by mouth daily. 180 tablet 1  . torsemide (DEMADEX) 20 MG tablet Take 3 tablets (60 mg total) by mouth 2 (two) times daily. 180 tablet 5  . ADVAIR HFA 230-21 MCG/ACT inhaler USE 2 INHALATIONS EVERY MORNING (Patient not taking: No sig reported) 48 g 5  . SUMAtriptan (IMITREX) 50 MG tablet Take 1 tablet (50 mg total) by mouth daily as needed for migraine or headache. (Patient not taking: Reported on 02/09/2020) 10 tablet 2  . thiamine 100 MG tablet Take 1 tablet (100 mg total) by mouth daily. (Patient not taking: Reported on 02/09/2020) 30 tablet 5   No current facility-administered medications for this encounter.    Vitals:   02/09/20 0954  BP: 106/68  Pulse: 89  SpO2: 94%  Weight: 84.6 kg (186 lb 9.6 oz)   Wt Readings from Last 3 Encounters:  02/09/20 84.6 kg  02/05/20 83.8 kg  01/26/20 90.2 kg  PHYSICAL EXAM: General:  Walked in the clinic.  No resp difficulty HEENT: normal Neck: supple. JVP 7-8 . Carotids 2+ bilaterally; no bruits. No lymphadenopathy or thryomegaly appreciated. Cor: PMI normal. Regular rate & rhythm. No rubs, gallops or  murmurs. Lungs: clear Abdomen: soft, nontender, nondistended. No hepatosplenomegaly. No bruits or masses. Good bowel sounds. Extremities: no cyanosis, clubbing, rash, R and LLE lower 1/3 of and ankles 1+ edema Neuro: alert & orientedx3, cranial nerves grossly intact. Moves all 4 extremities w/o difficulty. Affect pleasant.    ASSESSMENT & PLAN:  1. Chronic Diastolic Heart Failure  ECHO 01/2020 EF 60-65% Grade IIDD NYHA III. Volume status stable.  Continue current dose of torsemide.  -Check BMET today.   2. PAH ECHO 01/2020 EF 60-65% RVSP 83 Grade II DD On triple therapy- selexipag 1000 mcg twice a day. Intolerant higher dose due to diarrhea and headaches . - Continue macitentan 10 daily and adcirca 40 mg daily.  3. CKD Stage IV  Check BMET   4. Headaches  HAs could definitely come from selexipag but would be unusual to get worse without changing dose. - he has chronic pancytopenia due to liver disease (followed by Dr. Alen Blew) - Started on immitrex recent hospitalization.   5.  Recurrent syncope/seizure - LINQ interrogations have shown no arrhythmias and thus most likely seizure - Continue Keppra. Follows with Dr. Delice Lesch  6. OSA:  - Has mild OSA with AHI 12 and desats down to 82%.  - Intolerant CPAP.  7. Pancytopenia:  - Has seen Dr. Alen Blew in Hematology - felt like it is due to   Follow up in 2 weeks. He provided HCPOA form. His daughter is now the HCPOA.   Darren Lograsso NP-C  4:30 PM

## 2020-02-09 NOTE — Patient Outreach (Signed)
[place] [facility]) Care Management  02/09/2020  Darren Mueller 08/09/1951 [id number]   Referral received 02/06/2020 2nd outreach 02/08/2020 Pt called back from call placed on 4/7-Enrolled into the HF program  RN initially spoke with pt and confirmed identifiers and introduced the purpose for today's call. Pt receptive however requested RN to speak with his daughter Darren Mueller) who explained pt is involved with [facility] for [facility] services and has been provided information on pt's HF. Pt and daughter with good understanding of the possible symptoms and what to do. RN further explained [facility] would interfere with the current [facility] services however would be an additional sources of education and resources with social workers and pharmacy available to assist if needed. Offered to enroll pt into the [facility] program and further offer education and follow up calls as needed. Daughter very receptive and agreed to month follow up at this time. RN discussed and generated enrollment into the HF program and educated on when to contact the provider if pt encounters swelling with SOB or no longer in the GREEN zone. Daughter very aware of the different levels on the HF action zones verifying pt remains in the GREEN zone with precipitating symptoms at this time.    Long and short goals generated related to HF. RN offered to completed the initial assesment in a few weeks with another follow up call (agreed). Caregiver aware RN will communicate with the pt's provider on enrolling into the [facility] program and his disposition with a plan of care.  [facility] CM Care Plan Problem One     Most Recent Value  Care Plan Problem One  eficient Knowledge related to CHF unfamiliarity with information/education  Role Documenting the Problem One  Care Management Telephonic Coordinator  Care Plan for Problem One  Active  [facility] Long Term Goal   Pt/Caregiver will verbalize a plan of action in the YELLOW zone within the next 90 days.  [facility]  Long Term Goal Start Date  02/09/20  Interventions for Problem One Long Term Goal  Will send printed educational information on the COPD action actions and what to do if acute symptoms should occur. Will strongly encouraged caregiver/pt to contact his provider at this time with any related symptoms outside the GREEN zone to avoid readmission  [facility] CM Short Term Goal #1   Adherence with all discharge medications within the next 30 days.  [facility] CM Short Term Goal #1 Start Date  02/09/20  Interventions for Short Term Goal #1  Will verified pt/caregiver has all medications and taking accordingly. Will offer to review however report the [facility] agency has review all medications. Will verify pt has HF medications and taking as prescribed. Will complete of medications on the next call with the iniaital assessment as requested.  [facility] CM Short Term Goal #2   Adherence with all medical appointments post op discharge within the next 30 days.  [facility] CM Short Term Goal #2 Start Date  02/09/20  Interventions for Short Term Goal #2  WIll verify sufficient transportation and offer additional resources for transportation if needed. Will verify pt awareness of his scheduled appointments. Will strongly encouraged adherence as  stronge prevention measures for any needed corrections or changes in his plan of care with his providers to avoid readmission or acute problems for occuring.      [name], RN Care Management Coordinator [place] Office [phone]

## 2020-02-09 NOTE — Telephone Encounter (Signed)
Home health nurse called for verbal orders for plan of care and need PT on OT to evaluate for reconditioning since patient has been in hospital for so long.  Verbal orders given.

## 2020-02-09 NOTE — Discharge Summary (Addendum)
Physician Discharge Summary  Darren Mueller ZRA:[id number] DOB: [birth year] DOA: 01/30/2020  PCP: Darren Lukes, MD  Admit date: 01/30/2020 Discharge date: 02/05/2020  Time spent: 43mnutes  Recommendations for Outpatient Follow-up:  1. CHF clinic on 4/8, please check labs at follow-up 2. Consider palliative care discussions, goals of care, CODE STATUS discussions given advanced heart failure, pulmonary hypertension, cirrhosis and kidney disease stage IV   Discharge Diagnoses:  Principal Problem:   Acute exacerbation of CHF (congestive heart failure) (HCC) Acute on chronic right heart failure Acute on chronic diastolic CHF Severe pulmonary hypertension Liver cirrhosis Esophageal varices Severe thrombocytopenia AKI on CKD stage IV Coronary artery disease Right leg cellulitis COPD History of alcohol abuse   Discharge Condition: Stable  Diet recommendation: Low-sodium, heart healthy  Filed Weights   02/03/20 0624 02/04/20 0005 02/05/20 0505  Weight: 86.3 kg 85.9 kg 83.8 kg    History of present illness:   Mr. JVivianiis a 69y.o. M with hx pHTN, cirrhosis, dCHF, and CKD IIIb who presented with days progressive shortness of breath, leg swelling.  Hospital Course:   Acute on chronic right heart failure Acute on chronic diastolic CHF  Severe pulmonary hypertension -Clinically improving, diuresed with IV Lasix initially -Followed by cardiology -Transition to oral torsemide 60 mg twice daily -With potassium -Also continue pulmonary hypertension meds including Xelexipag, macitentan and tadalafil -Close follow-up with heart failure team post discharge  Right lower extremity cellulitis Admitted and started on broad spectrum IV antibiotics.  Redness around medial calf scab receded.  WBC normal, no fever.   - no purulent component, doubt MRSA.  DVT is considered unlikely.   -Continue Keflex for 3 more days -Continue unna boot   Severe  hypokalemia -Replaced  Cirrhosis Esophageal varices Severe thrombocytopenia Platelet count stably low.  No clinical bleeding.  AKI on CKD stage  IV -Cardiorenal, kidney function, creatinine was 4.1 on admission, trending down to 2.7 now -Likely cardiorenal -[facility] -Baseline creatinine around 2.5-3  Hypertension Coronary disease, secondary prevention -Continue rosuvastatin  COPD No evidence of bronchospasm.  Coag negative Staphylococcus in 1 out of 2 blood cultures  -Most consistent with contamination  -Repeat cultures negative thus far  Discharge Exam: Vitals:   02/05/20 0505 02/05/20 1000  BP: 106/65 105/62  Pulse: 86 71  Resp: 18 16  Temp: 97.9 F (36.6 C) 97.7 F (36.5 C)  SpO2: 95% 98%    General: AAOx3 Cardiovascular:S1S2/RRR Respiratory: CTAB  Discharge Instructions   Discharge Instructions    [facility]   Complete by: As directed    Patient was less than 30 days readmission:  [place] follow up calls and assess for further needs.  Dr. SPenni Homansis with [facility].  Questions please call:   VNatividad Brood RN BSN [facility]  [phone] mobile phone Toll free office [phone] Fax number: [phone]   Reason for consult: High risk, readmission   Diagnoses of: Heart Failure   Expected date of contact: 1-3 days (reserved for hospital discharges)   Diet - low sodium heart healthy   Complete by: As directed    Increase activity slowly   Complete by: As directed      Allergies as of 02/05/2020      Reactions   Aspirin Hypertension   Nitroglycerin Other (See Comments)  Severe Headache      Medication List    STOP taking these medications   traZODone 50 MG  tablet Commonly known as: DESYREL     TAKE these medications   acetaminophen 500 MG tablet Commonly known as: TYLENOL Take 1,000 mg by mouth every 6 (six) hours as needed (for pain.).   Advair HFA 230-21 MCG/ACT inhaler Generic drug: fluticasone-salmeterol USE 2 INHALATIONS EVERY MORNING   allopurinol 100 MG tablet Commonly known as: ZYLOPRIM Take 2 tablets (200 mg total) by mouth daily.   Blood Pressure Cuff Misc Use as directed. Check blood pressure bid prn Dx:I10   cephALEXin 500 MG capsule Commonly known as: KEFLEX Take 1 capsule (500 mg total) by mouth every 8 (eight) hours for 4 days.   diphenoxylate-atropine 2.5-0.025 MG tablet Commonly known as: Lomotil Take 1 tablet by mouth 4 (four) times daily as needed for diarrhea or loose stools.   Ferrous Fumarate 324 (106 Fe) MG Tabs tablet Commonly known as: Hemocyte Take 1 tablet (106 mg of iron total) by mouth daily.   folic acid 1 MG tablet Commonly known as: FOLVITE Take 1 tablet (1 mg total) by mouth daily.   hyoscyamine 0.125 MG SL tablet Commonly known as: LEVSIN SL Place 1 tablet (0.125 mg total) under the tongue every 6 (six) hours as needed. What changed: reasons to take this   multivitamin tablet Take 1 tablet by mouth daily.   omeprazole 40 MG capsule Commonly known as: PRILOSEC TAKE 1 CAPSULE BY MOUTH ONCE DAILY   Opsumit 10 MG tablet Generic drug: macitentan Take 1 tablet (10 mg total) by mouth daily.   Potassium Chloride ER 20 MEQ Tbcr Take 40 mEq by mouth 2 (two) times daily. What changed: when to take this   rosuvastatin 5 MG tablet Commonly known as: CRESTOR TAKE 1 TABLET DAILY What changed: when to take this   tadalafil (PAH) 20 MG tablet Commonly known as: ADCIRCA Take 2 tablets (40 mg total) by mouth daily.   thiamine 100 MG tablet Take 1 tablet (100 mg total) by mouth daily.   torsemide 20 MG tablet Commonly known as: DEMADEX Take 3 tablets (60 mg total) by mouth 2 (two)  times daily.   Uptravi 1000 MCG Tabs Generic drug: Selexipag Take 1,000 mcg by mouth 2 (two) times daily.      Allergies  Allergen Reactions  . Aspirin Hypertension  . Nitroglycerin Other (See Comments)    Severe Headache   Follow-up Information    [facility]. Go on 02/09/2020.   Specialty: Cardiology Why: 10:00 AM, [facility], parking code [place] information: [address] [phone] [place]       Darren Lukes, MD. Schedule an appointment as soon as possible for a visit in 1 week(s).   Specialty: Family Medicine Contact information: [place] [address] [employer], [facility] Follow up.   Specialty: Home Health Services Why: [facility] will contact you for the first home visit. Contact information: [address] [place] [address] [phone]            The results of significant diagnostics from this hospitalization (including imaging, microbiology, ancillary and laboratory) are listed below for reference.    Significant Diagnostic Studies: DG Chest 2 View  Result Date: 02/02/2020 CLINICAL DATA:  Follow-up  of pneumonia. EXAM: CHEST - 2 VIEW COMPARISON:  01/30/2019 FINDINGS: Midline trachea. Mild cardiomegaly, accentuated by AP portable technique on the frontal radiograph. No pleural effusion or pneumothorax. Suspect mild pulmonary venous congestion. No lobar consolidation. Loop recorder. IMPRESSION: Cardiomegaly with suspicion of mild pulmonary venous congestion. No lobar consolidation to suggest pneumonia. Electronically Signed   By: Abigail Miyamoto M.D.   On: 02/02/2020 09:14   DG Chest 2 View  Result Date: 01/30/2020 CLINICAL DATA:  Shortness of breath. Leg swelling for 1 day. EXAM: CHEST - 2 VIEW COMPARISON:  One-view chest x-ray 01/20/2020 FINDINGS: The heart is enlarged.  Loop recorder is in place. Atherosclerotic changes are noted at the aortic arch. New interstitial and airspace opacities are present in the periphery of both lungs. Right greater than left basilar airspace opacities are present. A right pleural effusion is suspected. IMPRESSION: 1. Cardiomegaly without evident edema. 2. Patchy peripheral airspace opacities raises concern for infection. 3. Right greater than left basilar opacity and right pleural effusion is also concerning for infection versus is less likely atelectasis. Electronically Signed   By: San Morelle M.D.   On: 01/30/2020 16:42   DG Chest 2 View  Result Date: 01/11/2020 CLINICAL DATA:  Pedal edema. Shortness of breath and cough. 10 pound weight gain over 1 month. History of CHF. EXAM: CHEST - 2 VIEW COMPARISON:  Radiograph 12/08/2019. CT 12/24/2015 FINDINGS: Chronic cardiomegaly. Unchanged mediastinal contours. Chronic central vascular congestion. Minimal increase interstitial thickening from prior exam. No significant pleural effusion. No focal airspace disease. No pneumothorax. Implanted loop recorder projects over the left chest wall. No acute osseous abnormalities are seen. IMPRESSION: Chronic cardiomegaly and central vascular congestion. Minimal increase in interstitial thickening from prior exam, suspicious for mild pulmonary edema. Electronically Signed   By: Keith Rake M.D.   On: 01/11/2020 15:21   CT HEAD WO CONTRAST  Result Date: 01/24/2020 CLINICAL DATA:  Headache, primarily frontal in location EXAM: CT HEAD WITHOUT CONTRAST TECHNIQUE: Contiguous axial images were obtained from the base of the skull through the vertex without intravenous contrast. COMPARISON:  December 05, 2019 FINDINGS: Brain: Age related volume loss is stable. There is no intracranial mass, hemorrhage, extra-axial fluid collection, or midline shift. There is slight small vessel disease in the centra semiovale bilaterally. Elsewhere brain parenchyma  appears unremarkable. There is no demonstrable acute infarct. Vascular: There is no hyperdense vessel. There is calcification in each carotid siphon region. Skull: Bony calvarium appears intact. Sinuses/Orbits: There is mucosal thickening in multiple ethmoid air cells. Other paranasal sinuses are clear. Orbits appear symmetric bilaterally. Apparent prior cataract removals bilaterally. Other: Mastoid air cells are clear. IMPRESSION: Age related volume loss with mild periventricular small vessel disease. No acute infarct. No mass or hemorrhage. There are foci of arterial vascular calcification. There is mucosal thickening in multiple ethmoid air cells. Electronically Signed   By: Lowella Grip III M.D.   On: 01/24/2020 13:23   DG Chest Port 1 View  Result Date: 01/20/2020 CLINICAL DATA:  Shortness of breath and chest pain EXAM: PORTABLE CHEST 1 VIEW COMPARISON:  01/11/2020, 12/08/2019 FINDINGS: Cardiomegaly with mild central congestion. No consolidation, pleural effusion or pneumothorax. Recording device over the left lower chest. IMPRESSION: Cardiomegaly with mild central vascular congestion Electronically Signed   By: Donavan Foil M.D.   On: 01/20/2020 15:01   ECHOCARDIOGRAM COMPLETE  Result Date: 01/21/2020    ECHOCARDIOGRAM REPORT   Patient Name:   DOSS CYBULSKI Date of Exam: 01/21/2020 Medical Rec #:  [id number]         Height:       72.0 in Accession #:    [id number]        Weight:       213.1 lb Date of Birth:  1951/05/19          BSA:          2.189 m Patient Age:    31 years          BP:           109/65 mmHg Patient Gender: M                 HR:           70 bpm. Exam Location:  Inpatient Procedure: 2D Echo, Cardiac Doppler and Color Doppler Indications:    Abnormal ECG 794.31/R94.31  History:        Patient has prior history of Echocardiogram examinations, most                 recent 05/17/2019. CHF, Pulmonary HTN; Risk Factors:Hypertension,                 Former Smoker and Sleep Apnea. PAH.   Sonographer:    Clayton Lefort RDCS (AE) Referring Phys: [id number] [facility] A THOMAS  Sonographer Comments: Suboptimal apical window. IMPRESSIONS  1. Left ventricular ejection fraction, by estimation, is 60 to 65%. The left ventricle has normal function. The left ventricle has no regional wall motion abnormalities. There is moderate left ventricular hypertrophy. Left ventricular diastolic parameters are consistent with Grade II diastolic dysfunction (pseudonormalization). There is the interventricular septum is flattened in systole and diastole, consistent with right ventricular pressure and volume overload.  2. Prominent RV trabeculation. Right ventricular systolic function is severely reduced. The right ventricular size is severely enlarged. mildly increased right ventricular wall thickness. There is severely elevated pulmonary artery systolic pressure. The estimated right ventricular systolic pressure is 05.3 mmHg.  3. Left atrial size was mildly dilated.  4. Right atrial size was severely dilated.  5. The mitral valve is grossly normal. Trivial mitral valve regurgitation.  6. Tricuspid valve regurgitation is moderate.  7. The aortic valve is tricuspid. Aortic valve regurgitation is mild.  8. The inferior vena cava is dilated in size with <50% respiratory variability, suggesting right atrial pressure of 15 mmHg. FINDINGS  Left Ventricle: Left ventricular ejection fraction, by estimation, is 60 to 65%. The left ventricle has normal function. The left ventricle has no regional wall motion abnormalities. The left ventricular internal cavity size was small. There is moderate  left ventricular hypertrophy. The interventricular septum is flattened in systole and diastole, consistent with right ventricular pressure and volume overload. Left ventricular diastolic parameters are consistent with Grade II diastolic dysfunction (pseudonormalization). Right Ventricle: Prominent RV trabeculation. The right ventricular size is  severely enlarged. Mildly increased right ventricular wall thickness. Right ventricular systolic function is severely reduced. There is severely elevated pulmonary artery systolic  pressure. The tricuspid regurgitant velocity is 4.13 m/s, and with an assumed right atrial pressure of 15 mmHg, the estimated right ventricular systolic pressure is 97.6 mmHg. Left Atrium: Left atrial size was mildly dilated. Right Atrium: Right atrial size was severely dilated. Pericardium: A small pericardial effusion is present. The pericardial effusion is posterior to the left ventricle. Mitral Valve: The mitral valve is grossly normal. There is mild thickening of the mitral valve leaflet(s). Mild mitral annular calcification. Trivial mitral valve regurgitation. MV peak gradient, 5.6  mmHg. The mean mitral valve gradient is 2.0 mmHg. Tricuspid Valve: The tricuspid valve is grossly normal. Tricuspid valve regurgitation is moderate. Aortic Valve: The aortic valve is tricuspid. Aortic valve regurgitation is mild. Aortic regurgitation PHT measures 456 msec. Mild aortic valve annular calcification. Aortic valve mean gradient measures 9.0 mmHg. Aortic valve peak gradient measures 17.6 mmHg. Aortic valve area, by VTI measures 1.98 cm. Pulmonic Valve: The pulmonic valve was grossly normal. Pulmonic valve regurgitation is trivial. Aorta: The aortic root is normal in size and structure. Venous: The inferior vena cava is dilated in size with less than 50% respiratory variability, suggesting right atrial pressure of 15 mmHg. IAS/Shunts: No atrial level shunt detected by color flow Doppler.  LEFT VENTRICLE PLAX 2D LVIDd:         4.00 cm  Diastology LVIDs:         2.60 cm  LV e' lateral:   5.98 cm/s LV PW:         1.60 cm  LV E/e' lateral: 13.8 LV IVS:        2.30 cm  LV e' medial:    6.96 cm/s LVOT diam:     2.10 cm  LV E/e' medial:  11.9 LV SV:         85 LV SV Index:   39 LVOT Area:     3.46 cm  RIGHT VENTRICLE             IVC RV Basal diam:   3.60 cm     IVC diam: 3.00 cm RV S prime:     13.80 cm/s TAPSE (M-mode): 2.6 cm LEFT ATRIUM             Index       RIGHT ATRIUM           Index LA diam:        4.50 cm 2.06 cm/m  RA Area:     32.50 cm LA Vol (A2C):   56.5 ml 25.81 ml/m RA Volume:   117.00 ml 53.45 ml/m LA Vol (A4C):   80.0 ml 36.55 ml/m LA Biplane Vol: 68.6 ml 31.34 ml/m  AORTIC VALVE AV Area (Vmax):    1.92 cm AV Area (Vmean):   2.09 cm AV Area (VTI):     1.98 cm AV Vmax:           209.67 cm/s AV Vmean:          140.667 cm/s AV VTI:            0.427 m AV Peak Grad:      17.6 mmHg AV Mean Grad:      9.0 mmHg LVOT Vmax:         116.00 cm/s LVOT Vmean:        84.900 cm/s LVOT VTI:          0.244 m LVOT/AV VTI ratio: 0.57 AI PHT:            456 msec  AORTA Ao Root diam: 3.30 cm Ao Asc diam:  3.80 cm MITRAL VALVE               TRICUSPID VALVE MV Area (PHT): 2.34 cm    TR Peak grad:   68.2 mmHg MV Peak grad:  5.6 mmHg    TR Vmax:        413.00 cm/s MV Mean grad:  2.0 mmHg MV Vmax:       1.18 m/s    SHUNTS MV Vmean:  66.3 cm/s   Systemic VTI:  0.24 m MV Decel Time: 324 msec    Systemic Diam: 2.10 cm MV E velocity: 82.60 cm/s MV A velocity: 96.70 cm/s MV E/A ratio:  0.85 Rozann Lesches MD Electronically signed by Rozann Lesches MD Signature Date/Time: 01/21/2020/4:08:29 PM    Final    CUP PACEART REMOTE DEVICE CHECK  Result Date: 02/02/2020 [facility] summary report received. Battery status OK. Normal device function. No new symptom episodes, tachy episodes, brady, or pause episodes. No new AF episodes. Monthly summary reports and ROV/PRN Kathy Breach, RN, CCDS, [employer]  US ABDOMEN LIMITED RUQ  Result Date: 01/23/2020 CLINICAL DATA:  Right upper quadrant pain for the past week. EXAM: ULTRASOUND ABDOMEN LIMITED RIGHT UPPER QUADRANT COMPARISON:  Right upper quadrant ultrasound dated February 14, 2019. FINDINGS: Gallbladder: Contracted gallbladder with diffuse wall thickening measuring up to 5 mm. No gallstones visualized. No  sonographic Murphy sign noted by sonographer. Common bile duct: Diameter: 7 mm, at the upper limits of normal. Liver: No focal lesion identified. Unchanged nodular contour with coarsened heterogeneous parenchymal echogenicity. Portal vein is patent on color Doppler imaging with normal direction of blood flow towards the liver. Other: Small perihepatic ascites. IMPRESSION: 1. No acute abnormality. 2. Unchanged cirrhosis. New small ascites. 3. Contracted gallbladder with mild diffuse wall thickening likely related to underlying liver disease and ascites. Electronically Signed   By: Titus Dubin M.D.   On: 01/23/2020 15:43    Microbiology: Recent Results (from the past 240 hour(s))  SARS CORONAVIRUS 2 (TAT 6-24 HRS) Nasopharyngeal Nasopharyngeal Swab     Status: None   Collection Time: 01/30/20  8:59 PM   Specimen: Nasopharyngeal Swab  Result Value Ref Range Status   SARS Coronavirus 2 NEGATIVE NEGATIVE Final    Comment: (NOTE) SARS-CoV-2 target nucleic acids are NOT DETECTED. The SARS-CoV-2 RNA is generally detectable in upper and lower respiratory specimens during the acute phase of infection. Negative results do not preclude SARS-CoV-2 infection, do not rule out co-infections with other pathogens, and should not be used as the sole basis for treatment or other patient management decisions. Negative results must be combined with clinical observations, patient history, and epidemiological information. The expected result is Negative. Fact Sheet for Patients: SugarRoll.be Fact Sheet for Healthcare Providers: https://www.woods-mathews.com/ This test is not yet approved or cleared by the [place] FDA and  has been authorized for detection and/or diagnosis of SARS-CoV-2 by FDA under an Emergency Use Authorization (EUA). This EUA will remain  in effect (meaning this test can be used) for the duration of the COVID-19 declaration under Section 56  4(b)(1) of the Act, 21 U.S.C. section 360bbb-3(b)(1), unless the authorization is terminated or revoked sooner. Performed at [facility], [place] [address], [place], [place] [address]   Culture, blood (Routine X 2) w Reflex to ID Panel     Status: Abnormal   Collection Time: 01/30/20  9:25 PM   Specimen: BLOOD  Result Value Ref Range Status   Specimen Description BLOOD LEFT UPPER ARM  Final   Special Requests   Final    BOTTLES DRAWN AEROBIC AND ANAEROBIC Blood Culture adequate volume   Culture  Setup Time   Final    GRAM POSITIVE COCCI IN CLUSTERS AEROBIC BOTTLE ONLY CRITICAL RESULT CALLED TO, READ BACK BY AND VERIFIED WITHJiles Garter [facility] PHARMD 6045 01/31/20 A BROWNING    Culture (A)  Final    STAPHYLOCOCCUS SPECIES (COAGULASE NEGATIVE) THE SIGNIFICANCE OF ISOLATING  THIS ORGANISM FROM A SINGLE SET OF BLOOD CULTURES WHEN MULTIPLE SETS ARE DRAWN IS UNCERTAIN. PLEASE NOTIFY THE MICROBIOLOGY DEPARTMENT WITHIN ONE WEEK IF SPECIATION AND SENSITIVITIES ARE REQUIRED. Performed at [facility], [place] [address] [place], [place] [address]    Report Status 02/01/2020 FINAL  Final  Culture, blood (routine x 2)     Status: None   Collection Time: 02/02/20  8:14 AM   Specimen: BLOOD  Result Value Ref Range Status   Specimen Description BLOOD LEFT ANTECUBITAL  Final   Special Requests   Final    AEROBIC BOTTLE ONLY Blood Culture results may not be optimal due to an inadequate volume of blood received in culture bottles   Culture   Final    NO GROWTH 5 DAYS Performed at [facility], [place] [address], [place], [place] [address]    Report Status 02/07/2020 FINAL  Final  Culture, blood (routine x 2)     Status: None   Collection Time: 02/02/20  8:18 AM   Specimen: BLOOD LEFT HAND  Result Value Ref Range Status   Specimen Description BLOOD LEFT HAND  Final   Special Requests AEROBIC BOTTLE ONLY Blood Culture adequate volume  Final   Culture   Final    NO GROWTH 5 DAYS Performed  at [facility], [place] [address], [place], [place] [address]    Report Status 02/07/2020 FINAL  Final     Labs: Basic Metabolic Panel: Recent Labs  Lab 02/03/20 0553 02/04/20 0505 02/04/20 1425 02/05/20 0436 02/09/20 1024  NA 135 134* 134* 135 134*  K 3.5 2.7* 3.5 3.3* 3.7  CL 104 100 99 100 100  CO2 21* _0 GLUCOSE 91 122* 101* 92 124*  BUN 61* 57* 55* 54* 49*  CREATININE 2.80* 2.77* 3.01* 2.70* 2.62*  CALCIUM 8.0* 8.1* 8.4* 8.3* 8.4*   Liver Function Tests: No results for input(s): AST, ALT, ALKPHOS, BILITOT, PROT, ALBUMIN in the last 168 hours. No results for input(s): LIPASE, AMYLASE in the last 168 hours. No results for input(s): AMMONIA in the last 168 hours. CBC: Recent Labs  Lab 02/03/20 0553 02/04/20 0505 02/05/20 0436  WBC 2.2* 2.1* 2.2*  HGB 9.5* 9.3* 10.0*  HCT 30.9* 29.4* 32.1*  MCV 105.8* 103.9* 103.5*  PLT 21* 21* 24*   Cardiac Enzymes: No results for input(s): CKTOTAL, CKMB, CKMBINDEX, TROPONINI in the last 168 hours. BNP: BNP (last 3 results) Recent Labs    05/16/19 1051 01/20/20 1504 01/30/20 1605  BNP 1,119.2* 506.0* >4,500.0*    ProBNP (last 3 results) Recent Labs    10/11/19 1138 12/08/19 1041 01/11/20 1439  PROBNP 604.0* 710.0* 494.0*    CBG: No results for input(s): GLUCAP in the last 168 hours.     Signed:  Domenic Polite MD.  [facility] 02/09/2020, 4:44 PM

## 2020-02-09 NOTE — Patient Instructions (Addendum)
A refill for Sumatriptan has been sent to the pharmacy. Please follow up with your primary for future refills.   Please weigh and document EVERYDAY!  Labs today We will only contact you if something comes back abnormal or we need to make some changes. Otherwise no news is good news!   Your physician recommends that you schedule a follow-up appointment in: 2 weeks with the Nurse Practitioner   Please call office at [phone] option 2 if you have any questions or concerns.   At the [facility], you and your health needs are our priority. As part of our continuing mission to provide you with exceptional heart care, we have created designated Provider Care Teams. These Care Teams include your primary Cardiologist (physician) and Advanced Practice Providers (APPs- Physician Assistants and Nurse Practitioners) who all work together to provide you with the care you need, when you need it.   You may see any of the following providers on your designated Care Team at your next follow up: Marland Kitchen Dr Glori Bickers . Dr Loralie Champagne . Darrick Grinder, NP . [name], PA . Audry Riles, PharmD   Please be sure to bring in all your medications bottles to every appointment.

## 2020-02-10 ENCOUNTER — Telehealth: Payer: Self-pay

## 2020-02-10 ENCOUNTER — Ambulatory Visit (INDEPENDENT_AMBULATORY_CARE_PROVIDER_SITE_OTHER): Payer: Medicare Other | Admitting: Family Medicine

## 2020-02-10 VITALS — BP 91/50 | HR 79 | Temp 98.0°F | Resp 12 | Wt 189.2 lb

## 2020-02-10 DIAGNOSIS — D696 Thrombocytopenia, unspecified: Secondary | ICD-10-CM

## 2020-02-10 DIAGNOSIS — M542 Cervicalgia: Secondary | ICD-10-CM | POA: Diagnosis not present

## 2020-02-10 DIAGNOSIS — N289 Disorder of kidney and ureter, unspecified: Secondary | ICD-10-CM | POA: Diagnosis not present

## 2020-02-10 DIAGNOSIS — N179 Acute kidney failure, unspecified: Secondary | ICD-10-CM

## 2020-02-10 DIAGNOSIS — R739 Hyperglycemia, unspecified: Secondary | ICD-10-CM

## 2020-02-10 DIAGNOSIS — R630 Anorexia: Secondary | ICD-10-CM

## 2020-02-10 DIAGNOSIS — I10 Essential (primary) hypertension: Secondary | ICD-10-CM

## 2020-02-10 DIAGNOSIS — K7030 Alcoholic cirrhosis of liver without ascites: Secondary | ICD-10-CM | POA: Diagnosis not present

## 2020-02-10 DIAGNOSIS — M25552 Pain in left hip: Secondary | ICD-10-CM | POA: Diagnosis not present

## 2020-02-10 DIAGNOSIS — M545 Low back pain, unspecified: Secondary | ICD-10-CM

## 2020-02-10 DIAGNOSIS — I5033 Acute on chronic diastolic (congestive) heart failure: Secondary | ICD-10-CM | POA: Diagnosis not present

## 2020-02-10 LAB — CBC
HCT: 31.8 % — ABNORMAL LOW (ref 39.0–52.0)
Hemoglobin: 10.4 g/dL — ABNORMAL LOW (ref 13.0–17.0)
MCHC: 32.8 g/dL (ref 30.0–36.0)
MCV: 102.5 fl — ABNORMAL HIGH (ref 78.0–100.0)
Platelets: 38 10*3/uL — CL (ref 150.0–400.0)
RBC: 3.1 Mil/uL — ABNORMAL LOW (ref 4.22–5.81)
RDW: 19.8 % — ABNORMAL HIGH (ref 11.5–15.5)
WBC: 2.3 10*3/uL — ABNORMAL LOW (ref 4.0–10.5)

## 2020-02-10 LAB — COMPREHENSIVE METABOLIC PANEL
ALT: 23 U/L (ref 0–53)
AST: 56 U/L — ABNORMAL HIGH (ref 0–37)
Albumin: 3.2 g/dL — ABNORMAL LOW (ref 3.5–5.2)
Alkaline Phosphatase: 321 U/L — ABNORMAL HIGH (ref 39–117)
BUN: 55 mg/dL — ABNORMAL HIGH (ref 6–23)
CO2: 24 mEq/L (ref 19–32)
Calcium: 8.4 mg/dL (ref 8.4–10.5)
Chloride: 100 mEq/L (ref 96–112)
Creatinine, Ser: 2.7 mg/dL — ABNORMAL HIGH (ref 0.40–1.50)
GFR: 28.41 mL/min — ABNORMAL LOW (ref >60.00)
Glucose, Bld: 107 mg/dL — ABNORMAL HIGH (ref 70–99)
Potassium: 4 mEq/L (ref 3.5–5.1)
Sodium: 133 mEq/L — ABNORMAL LOW (ref 135–145)
Total Bilirubin: 1.9 mg/dL — ABNORMAL HIGH (ref 0.2–1.2)
Total Protein: 6.6 g/dL (ref 6.0–8.3)

## 2020-02-10 NOTE — Progress Notes (Signed)
Virtual Visit via Video Note  I connected with Darren Mueller on 02/10/20 at  9:00 AM EDT by a video enabled telemedicine application and verified that I am speaking with the correct person using two identifiers.  Location: Patient: office Provider: home   I discussed the limitations of evaluation and management by telemedicine and the availability of in person appointments. The patient expressed understanding and agreed to proceed. Magdalene Molly, CMA was able to set up patient on visit, video   Subjective:    Patient ID: Darren Mueller, male    DOB: 03-05-1951, 69 y.o.   MRN: [id number]  Chief Complaint  Patient presents with  . Hospitalization Follow-up    HPI Patient is in today for follow up after a recent hospitalizations. He is accompanied by his daughter. His heart failure and breathing are much improved. He is struggling with anorexia and fatigue. He is not eating well. His bowels are moving well. No recent febrile illness. Denies CP/palp/HA/congestion/fevers/GI or GU c/o. Taking meds as prescribed. He is noting some neck pain, stiffness bilaterally since returning home and some trouble with posterior left hip. No fall or trauma. Worse in am and after prolonged sitting.   Past Medical History:  Diagnosis Date  . Alcohol dependence ([place]) 03/22/2011   In remission   . Alcohol dependence in remission ([place]) 03/22/2011   In remission   . Alcoholic cirrhosis of liver with ascites ([place]) 2014  . Anginal pain ([place])    with SOB admitted 04/20/2019  . Cardiomegaly 02/2019   Noted on CXR  . CHF (congestive heart failure) ([place])   . CKD (chronic kidney disease), stage III 07/18/2013  . COPD (chronic obstructive pulmonary disease) ([place])   . Coronary artery disease   . Dermatitis 06/10/2015  . Diarrhea 09/04/2013  . Dyspnea    admitted 04/20/2019  . Elbow pain, left 03/17/2017  . Eustachian tube dysfunction   . GERD (gastroesophageal reflux disease)   . Gout   . Headache   .  Hearing loss   . Heart murmur   . Hx of colonic polyps   . Hypertension   . Hypertriglyceridemia 03/17/2017  . Hypopotassemia   . Otalgia 11/28/2013  . Other chronic pulmonary heart diseases   . PAH (pulmonary artery hypertension) (H[facility])    RV failure  . Pancytopenia ([place])   . Pleural effusion 2014   Noted on CT:   Moderately large simple layering right pleural effusion   . Preventative health care 06/29/2016  . Pulmonary nodule 2014   Noted on CT: An 8 mm pulmonary nodule in the periphery of the left lower lobe  . Rectal bleeding   . Red eye 03/03/2016   right  . Seizures ([place])   . Sleep apnea    does not wear CPAP  . Splenomegaly 12/2015   Noted on CT  . Thoracic aorta atherosclerosis ([place]) 12/2017  . Thrombocytopenia ([place]) 06/29/2016  . Unspecified hypothyroidism 01/25/2013  . Unspecified pleural effusion   . Vertigo   . Wears glasses     Past Surgical History:  Procedure Laterality Date  . CARDIAC CATHETERIZATION N/A 12/21/2015   Procedure: Right Heart Cath;  Surgeon: Jolaine Artist, MD;  Location: [place] [facility];  Service: Cardiovascular;  Laterality: N/A;  . CARDIAC CATHETERIZATION N/A 04/15/2016   Procedure: Right/Left Heart Cath and Coronary Angiography;  Surgeon: Jolaine Artist, MD;  Location: [facility];  Service: Cardiovascular;  Laterality: N/A;  . COLONOSCOPY N/A 12/08/2013  Procedure: COLONOSCOPY;  Surgeon: Milus Banister, MD;  Location: [facility];  Service: Endoscopy;  Laterality: N/A;  . COLONOSCOPY WITH PROPOFOL N/A 03/24/2019   Procedure: COLONOSCOPY WITH PROPOFOL;  Surgeon: Milus Banister, MD;  Location: [facility];  Service: Endoscopy;  Laterality: N/A;  Draw CBC in preop  . COLONOSCOPY WITH PROPOFOL N/A 04/28/2019   Procedure: COLONOSCOPY WITH PROPOFOL;  Surgeon: Milus Banister, MD;  Location: [facility];  Service: Endoscopy;  Laterality: N/A;  . ESOPHAGOGASTRODUODENOSCOPY (EGD) WITH PROPOFOL N/A 03/27/2016   Procedure:  ESOPHAGOGASTRODUODENOSCOPY (EGD) WITH PROPOFOL;  Surgeon: Milus Banister, MD;  Location: [facility];  Service: Endoscopy;  Laterality: N/A;  . ESOPHAGOGASTRODUODENOSCOPY (EGD) WITH PROPOFOL N/A 02/14/2019   Procedure: ESOPHAGOGASTRODUODENOSCOPY (EGD) WITH PROPOFOL;  Surgeon: Jerene Bears, MD;  [facility] END[facility];  Service: Gastroenterology;  Laterality: N/A;  . EYE SURGERY Bilateral 03/15/2019   lasix  . GAS/FLUID EXCHANGE Right 09/22/2019   Procedure: GAS/FLUID EXCHANGE (C3F8) RIGHT EYE;  Surgeon: Sherlynn Stalls, MD;  Location: [facility];  Service: Ophthalmology;  Laterality: Right;  . HEMOSTASIS CLIP PLACEMENT  04/28/2019   Procedure: HEMOSTASIS CLIP PLACEMENT;  Surgeon: Milus Banister, MD;  Location: [facility];  Service: Endoscopy;;  . LOOP RECORDER INSERTION N/A 01/01/2017   Procedure: Loop Recorder Insertion;  Surgeon: Thompson Grayer, MD;  Location: [place] [facility];  Service: Cardiovascular;  Laterality: N/A;  . PARS PLANA VITRECTOMY Right 09/22/2019   Procedure: PARS PLANA VITRECTOMY WITH 25 GAUGE RIGHT EYE;  Surgeon: Sherlynn Stalls, MD;  Location: [place];  Service: Ophthalmology;  Laterality: Right;  . PHOTOCOAGULATION WITH LASER Right 09/22/2019   Procedure: PHOTOCOAGULATION WITH LASER RIGHT EYE;  Surgeon: Sherlynn Stalls, MD;  Location: [place];  Service: Ophthalmology;  Laterality: Right;  . POLYPECTOMY  04/28/2019   Procedure: POLYPECTOMY;  Surgeon: Milus Banister, MD;  Location: [facility];  Service: Endoscopy;;  . RIGHT HEART CATHETERIZATION Right 06/06/2014   Procedure: RIGHT HEART CATH;  Surgeon: Jolaine Artist, MD;  Location: [facility] CATH LAB;  Service: Cardiovascular;  Laterality: Right;  . UVULOPALATOPHARYNGOPLASTY  1999    Family History  Problem Relation Age of Onset  . Heart disease Mother   . Heart attack Mother   . Hypertension Mother   . Kidney failure Mother   . Liver disease Mother        stage 4 liver disease  . Prostate cancer Father   . Cancer Father         lung  . Hypertension Brother   . Gout Brother   . Alcoholism Maternal Grandfather   . Liver disease Maternal Grandfather   . Migraines Sister   . Hypertension Brother   . Colon cancer Neg Hx     Social History   Socioeconomic History  . Marital status: Divorced    Spouse name: Not on file  . Number of children: 3  . Years of education: Not on file  . Highest education level: Not on file  Occupational History  . Occupation: Retired    Comment: Social research officer, government  Tobacco Use  . Smoking status: Former Smoker    Packs/day: 2.00    Years: 5.00    Pack years: 10.00    Types: Cigarettes    Quit date: 09/22/1979    Years since quitting: 40.4  . Smokeless tobacco: Never Used  Substance and Sexual Activity  . Alcohol use: Yes    Alcohol/week: 6.0 standard drinks    Types: 6 Shots of liquor per week  Comment: social drinker  . Drug use: No  . Sexual activity: Yes    Birth control/protection: Spermicide  Other Topics Concern  . Not on file  Social History Narrative   0 caffeine drinks daily    Social Determinants of Health   Financial Resource Strain: Low Risk   . Difficulty of Paying Living Expenses: Not hard at all  Food Insecurity: No Food Insecurity  . Worried About Charity fundraiser in the Last Year: Never true  . Ran Out of Food in the Last Year: Never true  Transportation Needs: No Transportation Needs  . Lack of Transportation (Medical): No  . Lack of Transportation (Non-Medical): No  Physical Activity: Inactive  . Days of Exercise per Week: 0 days  . Minutes of Exercise per Session: 0 min  Stress: Stress Concern Present  . Feeling of Stress : Rather much  Social Connections: Slightly Isolated  . Frequency of Communication with Friends and Family: More than three times a week  . Frequency of Social Gatherings with Friends and Family: Three times a week  . Attends Religious Services: More than 4 times per year  . Active Member of Clubs or Organizations: Yes  .  Attends Archivist Meetings: More than 4 times per year  . Marital Status: Divorced  Human resources officer Violence:   . Fear of Current or Ex-Partner:   . Emotionally Abused:   Marland Kitchen Physically Abused:   . Sexually Abused:     Outpatient Medications Prior to Visit  Medication Sig Dispense Refill  . acetaminophen (TYLENOL) 500 MG tablet Take 1,000 mg by mouth every 6 (six) hours as needed (for pain.).    Marland Kitchen allopurinol (ZYLOPRIM) 100 MG tablet Take 2 tablets (200 mg total) by mouth daily. 180 tablet 1  . Blood Pressure Monitoring (BLOOD PRESSURE CUFF) MISC Use as directed. Check blood pressure bid prn Dx:I10 1 each 0  . diphenoxylate-atropine (LOMOTIL) 2.5-0.025 MG tablet Take 1 tablet by mouth 4 (four) times daily as needed for diarrhea or loose stools. 40 tablet 1  . Ferrous Fumarate (HEMOCYTE) 324 (106 Fe) MG TABS tablet Take 1 tablet (106 mg of iron total) by mouth daily. 30 tablet 5  . folic acid (FOLVITE) 1 MG tablet Take 1 tablet (1 mg total) by mouth daily. 30 tablet 5  . hyoscyamine (LEVSIN SL) 0.125 MG SL tablet Place 1 tablet (0.125 mg total) under the tongue every 6 (six) hours as needed. (Patient taking differently: Place 0.125 mg under the tongue every 6 (six) hours as needed for cramping. ) 30 tablet 1  . macitentan (OPSUMIT) 10 MG tablet Take 1 tablet (10 mg total) by mouth daily. 90 tablet 3  . Multiple Vitamin (MULTIVITAMIN) tablet Take 1 tablet by mouth daily.    Marland Kitchen omeprazole (PRILOSEC) 40 MG capsule TAKE 1 CAPSULE BY MOUTH ONCE DAILY (Patient taking differently: Take 40 mg by mouth daily. ) 90 capsule 0  . Potassium Chloride ER 20 MEQ TBCR Take 40 mEq by mouth 2 (two) times daily.    . rosuvastatin (CRESTOR) 5 MG tablet TAKE 1 TABLET DAILY (Patient taking differently: Take 5 mg by mouth daily at 6 PM. ) 90 tablet 3  . Selexipag (UPTRAVI) 1000 MCG TABS Take 1,000 mcg by mouth 2 (two) times daily. 60 tablet 11  . SUMAtriptan (IMITREX) 50 MG tablet Take 1 tablet (50 mg total)  by mouth daily as needed for migraine or headache. 10 tablet 1  . tadalafil, PAH, (ADCIRCA) 20  MG tablet Take 2 tablets (40 mg total) by mouth daily. 180 tablet 1  . torsemide (DEMADEX) 20 MG tablet Take 3 tablets (60 mg total) by mouth 2 (two) times daily. 180 tablet 5  . ADVAIR HFA 230-21 MCG/ACT inhaler USE 2 INHALATIONS EVERY MORNING (Patient not taking: No sig reported) 48 g 5  . thiamine 100 MG tablet Take 1 tablet (100 mg total) by mouth daily. (Patient not taking: Reported on 02/10/2020) 30 tablet 5   No facility-administered medications prior to visit.    Allergies  Allergen Reactions  . Aspirin Hypertension  . Nitroglycerin Other (See Comments)    Severe Headache    Review of Systems  Constitutional: Positive for malaise/fatigue. Negative for fever.  HENT: Negative for congestion.   Eyes: Negative for blurred vision.  Respiratory: Positive for shortness of breath.   Cardiovascular: Negative for chest pain, palpitations and leg swelling.  Gastrointestinal: Negative for abdominal pain, blood in stool and nausea.  Genitourinary: Negative for dysuria and frequency.  Musculoskeletal: Negative for falls.  Skin: Negative for rash.  Neurological: Negative for dizziness, loss of consciousness and headaches.  Endo/Heme/Allergies: Negative for environmental allergies.  Psychiatric/Behavioral: Negative for depression. The patient is not nervous/anxious.        Objective:    Physical Exam  BP (!) 91/50 (BP Location: Right Arm, Cuff Size: Normal)   Pulse 79   Temp 98 F (36.7 C) (Temporal)   Resp 12   Wt 189 lb 3.2 oz (85.8 kg)   SpO2 100%   BMI 24.96 kg/m  Wt Readings from Last 3 Encounters:  02/10/20 189 lb 3.2 oz (85.8 kg)  02/09/20 186 lb 9.6 oz (84.6 kg)  02/05/20 184 lb 12.8 oz (83.8 kg)    Diabetic Foot Exam - Simple   No data filed     Lab Results  Component Value Date   WBC 2.2 (L) 02/05/2020   HGB 10.0 (L) 02/05/2020   HCT 32.1 (L) 02/05/2020   PLT 24  (LL) 02/05/2020   GLUCOSE 124 (H) 02/09/2020   CHOL 145 10/11/2019   TRIG 74.0 10/11/2019   HDL 57.00 10/11/2019   LDLDIRECT 34.0 05/04/2018   LDLCALC 73 10/11/2019   ALT 20 02/02/2020   AST 41 02/02/2020   NA 134 (L) 02/09/2020   K 3.7 02/09/2020   CL 100 02/09/2020   CREATININE 2.62 (H) 02/09/2020   BUN 49 (H) 02/09/2020   CO2 23 02/09/2020   TSH 1.805 02/01/2020   PSA 0.49 11/01/2012   INR 1.2 05/16/2019   HGBA1C 4.8 04/13/2019    Lab Results  Component Value Date   TSH 1.805 02/01/2020   Lab Results  Component Value Date   WBC 2.2 (L) 02/05/2020   HGB 10.0 (L) 02/05/2020   HCT 32.1 (L) 02/05/2020   MCV 103.5 (H) 02/05/2020   PLT 24 (LL) 02/05/2020   Lab Results  Component Value Date   NA 134 (L) 02/09/2020   K 3.7 02/09/2020   CO2 23 02/09/2020   GLUCOSE 124 (H) 02/09/2020   BUN 49 (H) 02/09/2020   CREATININE 2.62 (H) 02/09/2020   BILITOT 2.1 (H) 02/02/2020   ALKPHOS 196 (H) 02/02/2020   AST 41 02/02/2020   ALT 20 02/02/2020   PROT 6.0 (L) 02/02/2020   ALBUMIN 2.4 (L) 02/02/2020   CALCIUM 8.4 (L) 02/09/2020   ANIONGAP 11 02/09/2020   GFR 22.31 (L) 01/16/2020   Lab Results  Component Value Date   CHOL 145 10/11/2019  Lab Results  Component Value Date   HDL 57.00 10/11/2019   Lab Results  Component Value Date   LDLCALC 73 10/11/2019   Lab Results  Component Value Date   TRIG 74.0 10/11/2019   Lab Results  Component Value Date   CHOLHDL 3 10/11/2019   Lab Results  Component Value Date   HGBA1C 4.8 04/13/2019       Assessment & Plan:   Problem List Items Addressed This Visit    Hypertension (Chronic)    Monitor and report any concerns, no changes to meds. Encouraged heart healthy diet such as the DASH diet and exercise as tolerated.       Hepatic cirrhosis ([place])    Reminded once again to abstain from all alcohol      Low back pain    Left posterior hip pain since hospitalizations. Encouraged moist heat and gentle stretching as  tolerated. May try NSAIDs and prescription meds as directed and report if symptoms worsen or seek immediate care. Referred to PT for further evaluation and treatment and consider a low dose of Tizanidine qhs if needed.       Thrombocytopenia (H[facility])   Relevant Orders   Comprehensive metabolic panel   CBC   Hyperglycemia    hgba1c acceptable, minimize simple carbs. Increase exercise as tolerated.       Acute on chronic diastolic (congestive) heart failure ([facility])    He is doing much better in this regard since returning home from the hospital. He continues to follow with CHF clinic      AKI (acute kidney injury) ([place])    Creatinine yesterday 2.62 improved from hospitalization slightly he has a follow up appt later this month with nephrology      Neck pain - Primary    Encouraged moist heat and gentle stretching as tolerated. May try NSAIDs and prescription meds as directed and report if symptoms worsen or seek immediate care. Referred to PT      Relevant Orders   Ambulatory referral to Physical Therapy   Anorexia    He is encouraged to increase  Protein intake some and to minimize carbs especially alcohol.        Other Visit Diagnoses    Left hip pain       Relevant Orders   Ambulatory referral to Physical Therapy   Renal insufficiency       Relevant Orders   Comprehensive metabolic panel   CBC      I am having Darren Mueller "Col" maintain his Blood Pressure Cuff, acetaminophen, Ferrous Fumarate, thiamine, Uptravi, multivitamin, allopurinol, hyoscyamine, rosuvastatin, Opsumit, Advair HFA, omeprazole, tadalafil (PAH), diphenoxylate-atropine, folic acid, torsemide, Potassium Chloride ER, and SUMAtriptan.  No orders of the defined types were placed in this encounter.    I discussed the assessment and treatment plan with the patient. The patient was provided an opportunity to ask questions and all were answered. The patient agreed with the plan and demonstrated an  understanding of the instructions.   The patient was advised to call back or seek an in-person evaluation if the symptoms worsen or if the condition fails to improve as anticipated.  I provided 25 minutes of non-face-to-face time during this encounter.   Penni Homans, MD

## 2020-02-10 NOTE — Assessment & Plan Note (Signed)
Creatinine yesterday 2.62 improved from hospitalization slightly he has a follow up appt later this month with nephrology

## 2020-02-10 NOTE — Assessment & Plan Note (Signed)
Left posterior hip pain since hospitalizations. Encouraged moist heat and gentle stretching as tolerated. May try NSAIDs and prescription meds as directed and report if symptoms worsen or seek immediate care. Referred to PT for further evaluation and treatment and consider a low dose of Tizanidine qhs if needed.

## 2020-02-10 NOTE — Telephone Encounter (Signed)
CRITICAL VALUE STICKER  CRITICAL VALUE: Platelet 38,000  RECEIVER (on-site recipient of call): Zaim Nitta  DATE & TIME NOTIFIED: 02/10/2020 12:49pm  MESSENGER (representative from lab): Santiago Glad  MD NOTIFIED: Penni Homans  TIME OF NOTIFICATION: 12:49pm  RESPONSE: Sent to physician.

## 2020-02-10 NOTE — Assessment & Plan Note (Signed)
Monitor and report any concerns, no changes to meds. Encouraged heart healthy diet such as the DASH diet and exercise as tolerated.  ?

## 2020-02-10 NOTE — Assessment & Plan Note (Signed)
He is encouraged to increase  Protein intake some and to minimize carbs especially alcohol.

## 2020-02-10 NOTE — Assessment & Plan Note (Signed)
Reminded once again to abstain from all alcohol

## 2020-02-10 NOTE — Assessment & Plan Note (Signed)
Encouraged moist heat and gentle stretching as tolerated. May try NSAIDs and prescription meds as directed and report if symptoms worsen or seek immediate care. Referred to PT

## 2020-02-10 NOTE — Assessment & Plan Note (Signed)
He is doing much better in this regard since returning home from the hospital. He continues to follow with CHF clinic

## 2020-02-10 NOTE — Assessment & Plan Note (Signed)
hgba1c acceptable, minimize simple carbs. Increase exercise as tolerated.

## 2020-02-10 NOTE — Telephone Encounter (Signed)
Stable for patient asymptomatic

## 2020-02-13 ENCOUNTER — Telehealth (HOSPITAL_COMMUNITY): Payer: Self-pay

## 2020-02-13 NOTE — Telephone Encounter (Signed)
Patient brought in power of attorney documents. Documents placed in red folder to be scanned into chart.

## 2020-02-15 ENCOUNTER — Telehealth (HOSPITAL_COMMUNITY): Payer: Self-pay | Admitting: *Deleted

## 2020-02-15 NOTE — Telephone Encounter (Signed)
Left detailed message and requested a call back confirming patient received message and understood medication changes.

## 2020-02-15 NOTE — Telephone Encounter (Signed)
Increase torsemide to 80 mg twice a day.   Please call.   Anja Neuzil NPC-  4:16 PM

## 2020-02-15 NOTE — Telephone Encounter (Signed)
Pt called to report a 5lb weight in 1 week. Pt notices a little swelling. Taking all meds a prescribed no other complaints at this time.  Routed to [place] for advice

## 2020-02-16 ENCOUNTER — Ambulatory Visit: Payer: Medicare Other | Admitting: *Deleted

## 2020-02-20 ENCOUNTER — Other Ambulatory Visit: Payer: Self-pay

## 2020-02-20 ENCOUNTER — Ambulatory Visit: Payer: Medicare Other | Attending: Family Medicine | Admitting: Physical Therapy

## 2020-02-20 DIAGNOSIS — M545 Low back pain, unspecified: Secondary | ICD-10-CM

## 2020-02-20 DIAGNOSIS — R2681 Unsteadiness on feet: Secondary | ICD-10-CM | POA: Diagnosis not present

## 2020-02-20 DIAGNOSIS — R2689 Other abnormalities of gait and mobility: Secondary | ICD-10-CM | POA: Insufficient documentation

## 2020-02-20 DIAGNOSIS — R293 Abnormal posture: Secondary | ICD-10-CM | POA: Insufficient documentation

## 2020-02-20 DIAGNOSIS — M25552 Pain in left hip: Secondary | ICD-10-CM | POA: Insufficient documentation

## 2020-02-20 NOTE — Therapy (Signed)
[facility] [address]  [place], [place], [address] Phone: [phone]   Fax:  [phone]  Physical Therapy Evaluation  Patient Details  Name: Darren Mueller MRN: [id number] Date of Birth: [birth year] Referring Provider (PT): Mosie Lukes, MD   Encounter Date: 02/20/2020  PT End of Session - 02/20/20 1434    Visit Number  1    Number of Visits  16    Date for PT Re-Evaluation  04/16/20    Authorization Type  Medicare & T[employer] for Life    Progress Note Due on Visit  10    PT Start Time  1434    PT Stop Time  1534    PT Time Calculation (min)  60 min    Activity Tolerance  Patient tolerated treatment well    Behavior During Therapy  [facility] for tasks assessed/performed       Past Medical History:  Diagnosis Date  . Alcohol dependence ([place]) 03/22/2011   In remission   . Alcohol dependence in remission ([place]) 03/22/2011   In remission   . Alcoholic cirrhosis of liver with ascites ([place]) 2014  . Anginal pain ([place])    with SOB admitted 04/20/2019  . Cardiomegaly 02/2019   Noted on CXR  . CHF (congestive heart failure) ([place])   . CKD (chronic kidney disease), stage III 07/18/2013  . COPD (chronic obstructive pulmonary disease) ([place])   . Coronary artery disease   . Dermatitis 06/10/2015  . Diarrhea 09/04/2013  . Dyspnea    admitted 04/20/2019  . Elbow pain, left 03/17/2017  . Eustachian tube dysfunction   . GERD (gastroesophageal reflux disease)   . Gout   . Headache   . Hearing loss   . Heart murmur   . Hx of colonic polyps   . Hypertension   . Hypertriglyceridemia 03/17/2017  . Hypopotassemia   . Otalgia 11/28/2013  . Other chronic pulmonary heart diseases   . PAH (pulmonary artery hypertension) (HCC)    RV failure  . Pancytopenia ([place])   . Pleural effusion 2014   Noted on CT:   Moderately large simple layering right pleural effusion   . Preventative health care 06/29/2016  . Pulmonary nodule 2014   Noted  on CT: An 8 mm pulmonary nodule in the periphery of the left lower lobe  . Rectal bleeding   . Red eye 03/03/2016   right  . Seizures ([place])   . Sleep apnea    does not wear CPAP  . Splenomegaly 12/2015   Noted on CT  . Thoracic aorta atherosclerosis ([place]) 12/2017  . Thrombocytopenia ([place]) 06/29/2016  . Unspecified hypothyroidism 01/25/2013  . Unspecified pleural effusion   . Vertigo   . Wears glasses     Past Surgical History:  Procedure Laterality Date  . CARDIAC CATHETERIZATION N/A 12/21/2015   Procedure: Right Heart Cath;  Surgeon: Jolaine Artist, MD;  Location: [facility];  Service: Cardiovascular;  Laterality: N/A;  . CARDIAC CATHETERIZATION N/A 04/15/2016   Procedure: Right/Left Heart Cath and Coronary Angiography;  Surgeon: Jolaine Artist, MD;  Location: [facility];  Service: Cardiovascular;  Laterality: N/A;  . COLONOSCOPY N/A 12/08/2013   Procedure: COLONOSCOPY;  Surgeon: Milus Banister, MD;  Location: [facility];  Service: Endoscopy;  Laterality: N/A;  . COLONOSCOPY WITH PROPOFOL N/A 03/24/2019   Procedure: COLONOSCOPY WITH PROPOFOL;  Surgeon: Milus Banister, MD;  Location: [facility];  Service: Endoscopy;  Laterality: N/A;  Draw CBC in preop  . COLONOSCOPY WITH PROPOFOL N/A 04/28/2019   Procedure: COLONOSCOPY WITH PROPOFOL;  Surgeon: Milus Banister, MD;  Location: [facility];  Service: Endoscopy;  Laterality: N/A;  . ESOPHAGOGASTRODUODENOSCOPY (EGD) WITH PROPOFOL N/A 03/27/2016   Procedure: ESOPHAGOGASTRODUODENOSCOPY (EGD) WITH PROPOFOL;  Surgeon: Milus Banister, MD;  Location: [facility];  Service: Endoscopy;  Laterality: N/A;  . ESOPHAGOGASTRODUODENOSCOPY (EGD) WITH PROPOFOL N/A 02/14/2019   Procedure: ESOPHAGOGASTRODUODENOSCOPY (EGD) WITH PROPOFOL;  Surgeon: Jerene Bears, MD;  Location: [facility] ENDOSCOPY;  Service: Gastroenterology;  Laterality: N/A;  . EYE SURGERY Bilateral 03/15/2019   lasix  . GAS/FLUID EXCHANGE Right 09/22/2019   Procedure:  GAS/FLUID EXCHANGE (C3F8) RIGHT EYE;  Surgeon: Sherlynn Stalls, MD;  Location: [facility];  Service: Ophthalmology;  Laterality: Right;  . HEMOSTASIS CLIP PLACEMENT  04/28/2019   Procedure: HEMOSTASIS CLIP PLACEMENT;  Surgeon: Milus Banister, MD;  Location: [facility];  Service: Endoscopy;;  . LOOP RECORDER INSERTION N/A 01/01/2017   Procedure: Loop Recorder Insertion;  Surgeon: Thompson Grayer, MD;  Location: [facility];  Service: Cardiovascular;  Laterality: N/A;  . PARS PLANA VITRECTOMY Right 09/22/2019   Procedure: PARS PLANA VITRECTOMY WITH 25 GAUGE RIGHT EYE;  Surgeon: Sherlynn Stalls, MD;  Location: [facility];  Service: Ophthalmology;  Laterality: Right;  . PHOTOCOAGULATION WITH LASER Right 09/22/2019   Procedure: PHOTOCOAGULATION WITH LASER RIGHT EYE;  Surgeon: Sherlynn Stalls, MD;  Location: [facility];  Service: Ophthalmology;  Laterality: Right;  . POLYPECTOMY  04/28/2019   Procedure: POLYPECTOMY;  Surgeon: Milus Banister, MD;  Location: [facility];  Service: Endoscopy;;  . RIGHT HEART CATHETERIZATION Right 06/06/2014   Procedure: RIGHT HEART CATH;  Surgeon: Jolaine Artist, MD;  Location: [facility] LAB;  Service: Cardiovascular;  Laterality: Right;  . UVULOPALATOPHARYNGOPLASTY  1999    There were no vitals filed for this visit.   Subjective Assessment - 02/20/20 1438    Subjective  Pt reports he was recently hospitalized for 7 days (~2 weeks ago, 2nd hospitalization in <1 month) due to SOB/increased fluid (CHF exacerbation) and continues to have increased edema in LEs.  L low back/hip pain originated ~1.5 months ago but has been exacerbated by inactivity while hospitalized. He feels like balance has been declining since recent hospitalizations. Pain and impaired balance cause him to walk very slowly. Neck pain and spasm also included in referral but pt reports this has resolved and no longer an issue currently, although he maintains head in position of protraction and forward flexion t/o eval.     Pertinent History  PMH significant for CHF, CAD, COPD, CKD, HTN, aortopulmonary hypertension, heart murmur, HLD, cellulitis, vertigo, seizures, gout, ETOH abuse with ETOH cirrhosis and small esophageal varices    Limitations  Sitting    How long can you sit comfortably?  10-15 minutes    Currently in Pain?  Yes    Pain Score  8    8-9/10 (current & worst); 1-2/10 at best   Pain Location  Back    Pain Orientation  Left;Lower    Pain Descriptors / Indicators  Aching    Pain Type  Acute pain    Pain Radiating Towards  into L side    Pain Onset  More than a month ago   ~1.5 month   Pain Frequency  Constant   varies in intensity   Aggravating Factors   how he sits, prolonged walking    Pain Relieving Factors  postural correction; Tylenol  Effect of Pain on Daily Activities  limits sitting tolerance, limits walking tolerance         [facility] PT Assessment - 02/20/20 1434      Assessment   Medical Diagnosis  Low back and L hip pain, Debility, Unsteady gait    Referring Provider (PT)  Mosie Lukes, MD    Onset Date/Surgical Date  --   1.5 months   Next MD Visit  03/22/20    Prior Therapy  PT in 2018      Precautions   Precautions  Fall      Restrictions   Weight Bearing Restrictions  No      Balance Screen   Has the patient fallen in the past 6 months  No    Has the patient had a decrease in activity level because of a fear of falling?   No    Is the patient reluctant to leave their home because of a fear of falling?   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      Cognition   Overall Cognitive Status  Within Functional Limits for tasks assessed      Observation/Other Assessments-Edema    Edema  --   B LE pitting edema     Sensation   Light Touch  Appears Intact      Coordination   Gross Motor Movements are Fluid and Coordinated  Yes      Posture/Postural Control   Posture/Postural Control  Postural limitations    Postural Limitations  Forward head;Rounded Shoulders;Increased thoracic kyphosis;Flexed trunk      ROM / Strength   AROM / PROM / Strength  AROM;Strength      AROM   Overall AROM   Deficits;Due to pain   and fear of LOB   AROM Assessment Site  Lumbar;Hip    Lumbar Flexion  hands to mid shins    Lumbar Extension  70% limited    Lumbar - Right Side Bend  hand to lateral knee    Lumbar - Left Side Bend  hand to lateral knee    Lumbar - Right Rotation  50% limited    Lumbar - Left Rotation  75% limited      Strength   Overall Strength Comments  tested in sitting    Strength Assessment Site  Hip;Knee;Ankle    Right/Left Hip  Right;Left    Right Hip Flexion  3+/5    Right Hip Extension  3+/5    Right Hip External Rotation   4-/5    Right Hip Internal Rotation  4-/5    Right Hip ABduction  4-/5    Right Hip ADduction  4-/5    Left Hip Flexion  4-/5    Left Hip Extension  3+/5    Left Hip External Rotation  4-/5  Left Hip Internal Rotation  4-/5    Left Hip ABduction  4-/5    Left Hip ADduction  4-/5    Right/Left Knee  Right;Left    Right Knee Flexion  4+/5    Right Knee Extension  4+/5    Left Knee Flexion  4/5    Left Knee Extension  4+/5    Right/Left Ankle  Right;Left    Right Ankle Dorsiflexion  4-/5    Left Ankle Dorsiflexion  3+/5      Ambulation/Gait   Assistive device  None    Gait Pattern  Step-to pattern;Decreased stride length;Decreased hip/knee flexion - right;Decreased hip/knee flexion - left;Poor foot clearance - left;Poor foot clearance -  right;Trunk flexed    Ambulation Surface  Level;Indoor    Gait velocity  1.5 ft/sec    Gait velocity - backwards  --      Standardized Balance Assessment   Standardized Balance Assessment  Berg Balance Test;Timed Up and Go Test;10 meter walk test    10 Meter Walk  21.81 sec      Berg Balance Test   Sit to Stand  Able to stand without using hands and stabilize independently    Standing Unsupported  Able to stand 2 minutes with supervision    Sitting with Back Unsupported but Feet Supported on Floor or Stool  Able to sit safely and securely 2 minutes    Stand to Sit  Sits safely with minimal use of hands    Transfers  Able to transfer safely, minor use of hands    Standing Unsupported with Eyes Closed  Able to stand 10 seconds with supervision    Standing Unsupported with Feet Together  Able to place feet together independently and stand for 1 minute with supervision    From Standing, Reach Forward with Outstretched Arm  Can reach forward >12 cm safely (5")    From Standing Position, Pick up Object from [place] to pick up shoe, needs supervision    From Standing Position, Turn to Look Behind Over each Shoulder  Needs supervision when turning    Turn 360 Degrees  Able to turn 360 degrees safely but slowly    Standing Unsupported, Alternately Place Feet on Step/Stool  Able to complete >2 steps/needs minimal assist    Standing Unsupported, One Foot in Front  Able to take small step independently and hold 30 seconds    Standing on One Leg  Tries to lift leg/unable to hold 3 seconds but remains standing independently    Total Score  38    Berg comment:  37-45 significant risk for falls (>80%)      Timed Up and Go Test   Normal TUG (seconds)  27.62                Objective measurements completed on examination: See above findings.              PT Education - 02/20/20 1532    Education Details  PT eval findings and anticipated POC including elevated fall risk and  potential need for AD if balance does not improve    Person(s) Educated  Patient    Methods  Explanation    Comprehension  Verbalized understanding       PT Short Term Goals - 02/20/20 1534      PT SHORT TERM GOAL #1   Title  Patient will be independent with initial HEP    Status  New  Target Date  03/12/20      PT SHORT TERM GOAL #2   Title  Patient to report L hip/low back pain reduction in frequency and intensity by >/= 25-50%    Status  New    Target Date  03/19/20      PT SHORT TERM GOAL #3   Title  Patient will increase gait speed to >/= 2.0 ft/sec with improved posture and gait pattern to increase safety with community ambulation    Status  New    Target Date  03/19/20      PT SHORT TERM GOAL #4   Title  Patient will demonstrate decreased TUG time to </= 20 sec to decrease risk for falls with transitional mobility    Status  New    Target Date  03/19/20        PT Long Term Goals - 02/20/20 1534      PT LONG TERM GOAL #1   Title  Patient will be independent with ongoing/advanced HEP    Status  New    Target Date  04/16/20      PT LONG TERM GOAL #2   Title  Patient to report L hip/low back pain reduction in frequency and intensity by >/= 50-75%    Status  New    Target Date  04/16/20      PT LONG TERM GOAL #3   Title  Patient will demonstrate improved B LE strength to >/= 4 to 4+/5 for improved stability and ease of mobility    Status  New    Target Date  04/16/20      PT LONG TERM GOAL #4   Title  Patient will increase gait speed to >/= 2.62 ft/sec with good posture and normal gait pattern to increase safety with community ambulation    Status  New    Target Date  04/16/20      PT LONG TERM GOAL #5   Title  Patient will demonstrate decreased TUG time to </= 13.5 sec to decrease risk for falls with transitional mobility    Status  New    Target Date  04/16/20      PT LONG TERM GOAL #6   Title  Patient will improve Berg score to >/= 47/56 to improve  safety stability with ADLs in standing and reduce risk for falls    Status  New    Target Date  04/16/20             Plan - 02/20/20 1534    Clinical Impression Statement  "[name]" Jamarques is a 69 y/o male who presents to OP PT with referral for neck pain, L posterior hip/low back pain, debility, and unsteady gait. He denies neck pain currently stating this has resolved. L low back/hip pain originated ~1.5 months ago w/o known MOI and has been exacerbated by 2 recent hospitalizations for CHF exacerbations as has balance deficits. Deficits include variable L hip/low back pain depending on how long he sits, severely limited proximal LE flexibility, poor posture, abnormal gait pattern with gait speed 1.5 ft/sec indicating a recurrent risk for falls as well as TUG time and Berg score also indicating a significant to high risk for falls. [name] will benefit from skilled PT services to address above impairments and allow for performance of normal daily activities with decreased pain interference and reduced risk for falls.    Personal Factors and Comorbidities  Comorbidity 3+;Past/Current Experience;Time since onset of injury/illness/exacerbation;Age;Fitness    Comorbidities  PMH significant for CHF, CAD, COPD, CKD, HTN, aortopulmonary hypertension, heart murmur, HLD, cellulitis, vertigo, seizures, gout, ETOH abuse with ETOH cirrhosis and small esophageal varices    Examination-Activity Limitations  Bend;Lift;Locomotion Level;Sit;Squat;Stand    Examination-Participation Restrictions  Cleaning;Community Activity;Laundry;Meal Prep    Stability/Clinical Decision Making  Unstable/Unpredictable    Clinical Decision Making  High    Rehab Potential  Good    PT Frequency  2x / week    PT Duration  8 weeks    PT Treatment/Interventions  ADLs/Self Care Home Management;Cryotherapy;Electrical Stimulation;Moist Heat;DME Instruction;Gait training;Stair training;Functional mobility training;Therapeutic  activities;Therapeutic exercise;Balance training;Neuromuscular re-education;Patient/family education;Manual techniques;Passive range of motion;Dry needling;Taping;Joint Manipulations    PT Next Visit Plan  Monitor VS; Create initial HEP for LE/core flexibility and postural stability/strengthening    Consulted and Agree with Plan of Care  Patient       Patient will benefit from skilled therapeutic intervention in order to improve the following deficits and impairments:  Abnormal gait, Cardiopulmonary status limiting activity, Decreased activity tolerance, Decreased balance, Decreased coordination, Decreased endurance, Decreased knowledge of precautions, Decreased knowledge of use of DME, Decreased mobility, Decreased range of motion, Decreased safety awareness, Decreased strength, Difficulty walking, Increased edema, Increased muscle spasms, Impaired perceived functional ability, Impaired flexibility, Improper body mechanics, Postural dysfunction, Pain  Visit Diagnosis: Unsteadiness on feet  Other abnormalities of gait and mobility  Pain in left hip  Acute left-sided low back pain without sciatica  Abnormal posture     Problem List Patient Active Problem List   Diagnosis Date Noted  . Neck pain 02/10/2020  . Anorexia 02/10/2020  . AKI (acute kidney injury) ([place])   . Cellulitis of right lower extremity   . Acute exacerbation of CHF (congestive heart failure) ([place]) 01/30/2020  . CHF (congestive heart failure) ([place]) 01/20/2020  . Abdominal pain 08/23/2019  . Grief 08/23/2019  . Acute on chronic diastolic heart failure ([place]) 05/16/2019  . Acute on chronic diastolic (congestive) heart failure ([place]) 05/16/2019  . Calf pain 04/03/2019  . History of colonic polyps   . Polyp of cecum   . Headache 02/21/2019  . Rectal bleeding 02/13/2019  . COPD (chronic obstructive pulmonary disease) ([place]) 02/13/2019  . Acute on chronic right heart failure ([place]) 02/13/2019  . Hematochezia   . Ascites    . Dark stools   . Alcoholism ([place])   . Hyperglycemia 05/04/2018  . Seizure disorder ([place]) 02/01/2018  . Pedal edema 10/01/2017  . RUQ pain 05/06/2017  . Decreased range of motion of left elbow 04/15/2017  . Hyperlipidemia 03/17/2017  . Elbow pain, left 03/17/2017  . Right rib fracture   . Syncope 07/19/2016  . Thrombocytopenia ([place]) 06/29/2016  . Hoarseness, chronic 06/29/2016  . Preventative health care 06/29/2016  . Chest pain   . Low back pain 12/09/2015  . Cirrhosis with alcoholism ([place]) 08/08/2015  . Pancytopenia ([place]) 06/10/2015  . Dermatitis 06/10/2015  . OSA (obstructive sleep apnea) 02/13/2015  . Gout 07/30/2014  . Diarrhea 09/04/2013  . Chronic renal insufficiency, stage III (moderate) ([place]) 07/18/2013  . Right heart failure due to pulmonary hypertension ([place]) 06/06/2013  . Hypothyroidism 01/25/2013  . PAH (pulmonary arterial hypertension) with portal hypertension ([place]) 01/21/2013  . Allergic rhinitis 12/21/2012  . Secondary pulmonary hypertension 12/09/2012  . Hepatic cirrhosis ([place]) 11/26/2012  . GERD (gastroesophageal reflux disease) 09/05/2011  . Hypokalemia 08/29/2011  . Colon polyps 03/26/2011  . Insomnia 03/26/2011  . Hypertension 03/22/2011  . Alcohol dependence ([place]) 03/22/2011    Georgian Co  Luis Abed, PT, MPT 02/20/2020, 6:16 PM  [facility] [address]  [place], [place], [address] Phone: [phone]   Fax:  [phone]  Name: Darren Mueller MRN: [id number] Date of Birth: November 05, 1950

## 2020-02-22 ENCOUNTER — Other Ambulatory Visit: Payer: Self-pay

## 2020-02-22 ENCOUNTER — Ambulatory Visit: Payer: Medicare Other

## 2020-02-22 VITALS — BP 138/64 | HR 78

## 2020-02-22 DIAGNOSIS — I2721 Secondary pulmonary arterial hypertension: Secondary | ICD-10-CM | POA: Diagnosis not present

## 2020-02-22 DIAGNOSIS — R2689 Other abnormalities of gait and mobility: Secondary | ICD-10-CM

## 2020-02-22 DIAGNOSIS — R293 Abnormal posture: Secondary | ICD-10-CM | POA: Diagnosis not present

## 2020-02-22 DIAGNOSIS — K7030 Alcoholic cirrhosis of liver without ascites: Secondary | ICD-10-CM | POA: Diagnosis not present

## 2020-02-22 DIAGNOSIS — N2581 Secondary hyperparathyroidism of renal origin: Secondary | ICD-10-CM | POA: Diagnosis not present

## 2020-02-22 DIAGNOSIS — I5033 Acute on chronic diastolic (congestive) heart failure: Secondary | ICD-10-CM | POA: Diagnosis not present

## 2020-02-22 DIAGNOSIS — M25552 Pain in left hip: Secondary | ICD-10-CM

## 2020-02-22 DIAGNOSIS — R2681 Unsteadiness on feet: Secondary | ICD-10-CM

## 2020-02-22 DIAGNOSIS — D631 Anemia in chronic kidney disease: Secondary | ICD-10-CM | POA: Diagnosis not present

## 2020-02-22 DIAGNOSIS — I129 Hypertensive chronic kidney disease with stage 1 through stage 4 chronic kidney disease, or unspecified chronic kidney disease: Secondary | ICD-10-CM | POA: Diagnosis not present

## 2020-02-22 DIAGNOSIS — N179 Acute kidney failure, unspecified: Secondary | ICD-10-CM | POA: Diagnosis not present

## 2020-02-22 DIAGNOSIS — E781 Pure hyperglyceridemia: Secondary | ICD-10-CM | POA: Diagnosis not present

## 2020-02-22 DIAGNOSIS — M545 Low back pain, unspecified: Secondary | ICD-10-CM

## 2020-02-22 DIAGNOSIS — N189 Chronic kidney disease, unspecified: Secondary | ICD-10-CM | POA: Diagnosis not present

## 2020-02-22 DIAGNOSIS — G4733 Obstructive sleep apnea (adult) (pediatric): Secondary | ICD-10-CM | POA: Diagnosis not present

## 2020-02-22 DIAGNOSIS — D61818 Other pancytopenia: Secondary | ICD-10-CM | POA: Diagnosis not present

## 2020-02-22 DIAGNOSIS — N183 Chronic kidney disease, stage 3 unspecified: Secondary | ICD-10-CM | POA: Diagnosis not present

## 2020-02-22 DIAGNOSIS — D6959 Other secondary thrombocytopenia: Secondary | ICD-10-CM | POA: Diagnosis not present

## 2020-02-22 NOTE — Therapy (Signed)
[facility] [address]  [place], [place], [address] Phone: [phone]   Fax:  [phone]  Physical Therapy Treatment  Patient Details  Name: Darren Mueller MRN: [id number] Date of Birth: November 20, 1950 Referring Provider (PT): Mosie Lukes, MD   Encounter Date: 02/22/2020  PT End of Session - 02/22/20 0901    Visit Number  2    Number of Visits  16    Date for PT Re-Evaluation  04/16/20    Authorization Type  Medicare & T[employer] for Life    Progress Note Due on Visit  10    PT Start Time  0850    PT Stop Time  0928    PT Time Calculation (min)  38 min    Activity Tolerance  Patient tolerated treatment well    Behavior During Therapy  [facility] for tasks assessed/performed       Past Medical History:  Diagnosis Date  . Alcohol dependence ([place]) 03/22/2011   In remission   . Alcohol dependence in remission ([place]) 03/22/2011   In remission   . Alcoholic cirrhosis of liver with ascites ([place]) 2014  . Anginal pain ([place])    with SOB admitted 04/20/2019  . Cardiomegaly 02/2019   Noted on CXR  . CHF (congestive heart failure) ([place])   . CKD (chronic kidney disease), stage III 07/18/2013  . COPD (chronic obstructive pulmonary disease) ([place])   . Coronary artery disease   . Dermatitis 06/10/2015  . Diarrhea 09/04/2013  . Dyspnea    admitted 04/20/2019  . Elbow pain, left 03/17/2017  . Eustachian tube dysfunction   . GERD (gastroesophageal reflux disease)   . Gout   . Headache   . Hearing loss   . Heart murmur   . Hx of colonic polyps   . Hypertension   . Hypertriglyceridemia 03/17/2017  . Hypopotassemia   . Otalgia 11/28/2013  . Other chronic pulmonary heart diseases   . PAH (pulmonary artery hypertension) (HCC)    RV failure  . Pancytopenia ([place])   . Pleural effusion 2014   Noted on CT:   Moderately large simple layering right pleural effusion   . Preventative health care 06/29/2016  . Pulmonary nodule 2014   Noted  on CT: An 8 mm pulmonary nodule in the periphery of the left lower lobe  . Rectal bleeding   . Red eye 03/03/2016   right  . Seizures ([place])   . Sleep apnea    does not wear CPAP  . Splenomegaly 12/2015   Noted on CT  . Thoracic aorta atherosclerosis ([place]) 12/2017  . Thrombocytopenia ([place]) 06/29/2016  . Unspecified hypothyroidism 01/25/2013  . Unspecified pleural effusion   . Vertigo   . Wears glasses     Past Surgical History:  Procedure Laterality Date  . CARDIAC CATHETERIZATION N/A 12/21/2015   Procedure: Right Heart Cath;  Surgeon: Jolaine Artist, MD;  Location: [place];  Service: Cardiovascular;  Laterality: N/A;  . CARDIAC CATHETERIZATION N/A 04/15/2016   Procedure: Right/Left Heart Cath and Coronary Angiography;  Surgeon: Jolaine Artist, MD;  Location: [place];  Service: Cardiovascular;  Laterality: N/A;  . COLONOSCOPY N/A 12/08/2013   Procedure: COLONOSCOPY;  Surgeon: Milus Banister, MD;  Location: [facility];  Service: Endoscopy;  Laterality: N/A;  . COLONOSCOPY WITH PROPOFOL N/A 03/24/2019   Procedure: COLONOSCOPY WITH PROPOFOL;  Surgeon: Milus Banister, MD;  Location: [facility];  Service: Endoscopy;  Laterality: N/A;  Draw CBC in preop  . COLONOSCOPY WITH PROPOFOL N/A 04/28/2019   Procedure: COLONOSCOPY WITH PROPOFOL;  Surgeon: Milus Banister, MD;  Location: [facility];  Service: Endoscopy;  Laterality: N/A;  . ESOPHAGOGASTRODUODENOSCOPY (EGD) WITH PROPOFOL N/A 03/27/2016   Procedure: ESOPHAGOGASTRODUODENOSCOPY (EGD) WITH PROPOFOL;  Surgeon: Milus Banister, MD;  Location: [facility];  Service: Endoscopy;  Laterality: N/A;  . ESOPHAGOGASTRODUODENOSCOPY (EGD) WITH PROPOFOL N/A 02/14/2019   Procedure: ESOPHAGOGASTRODUODENOSCOPY (EGD) WITH PROPOFOL;  Surgeon: Jerene Bears, MD;  Location[facility];  Service: Gastroenterology;  Laterality: N/A;  . EYE SURGERY Bilateral 03/15/2019   lasix  . GAS/FLUID EXCHANGE Right 09/22/2019   Procedure:  GAS/FLUID EXCHANGE (C3F8) RIGHT EYE;  Surgeon: Sherlynn Stalls, MD;  Location: [facility];  Service: Ophthalmology;  Laterality: Right;  . HEMOSTASIS CLIP PLACEMENT  04/28/2019   Procedure: HEMOSTASIS CLIP PLACEMENT;  Surgeon: Milus Banister, MD;  Location: [facility];  Service: Endoscopy;;  . LOOP RECORDER INSERTION N/A 01/01/2017   Procedure: Loop Recorder Insertion;  Surgeon: Thompson Grayer, MD;  Location: [facility];  Service: Cardiovascular;  Laterality: N/A;  . PARS PLANA VITRECTOMY Right 09/22/2019   Procedure: PARS PLANA VITRECTOMY WITH 25 GAUGE RIGHT EYE;  Surgeon: Sherlynn Stalls, MD;  Location: [facility];  Service: Ophthalmology;  Laterality: Right;  . PHOTOCOAGULATION WITH LASER Right 09/22/2019   Procedure: PHOTOCOAGULATION WITH LASER RIGHT EYE;  Surgeon: Sherlynn Stalls, MD;  Location: [facility];  Service: Ophthalmology;  Laterality: Right;  . POLYPECTOMY  04/28/2019   Procedure: POLYPECTOMY;  Surgeon: Milus Banister, MD;  Location: [facility];  Service: Endoscopy;;  . RIGHT HEART CATHETERIZATION Right 06/06/2014   Procedure: RIGHT HEART CATH;  Surgeon: Jolaine Artist, MD;  Location: [facility] C[facility] LAB;  Service: Cardiovascular;  Laterality: Right;  . UVULOPALATOPHARYNGOPLASTY  1999    Vitals:   02/22/20 0858  BP: 138/64  Pulse: 78  SpO2: 91%    Subjective Assessment - 02/22/20 0858    Subjective  Pt. reporting has has not had significant pain in a few day.    Pertinent History  PMH significant for CHF, CAD, COPD, CKD, HTN, aortopulmonary hypertension, heart murmur, HLD, cellulitis, vertigo, seizures, gout, ETOH abuse with ETOH cirrhosis and small esophageal varices    Currently in Pain?  No/denies    Pain Score  0-No pain   L-sided LBP up to 9/10 at worst a few days ago   Pain Location  Back    Pain Orientation  Left;Lower    Pain Descriptors / Indicators  Aching    Pain Type  Acute pain    Pain Onset  More than a month ago    Pain Frequency  Constant    Aggravating Factors    Prolonged walking, or prolonged sitting    Multiple Pain Sites  No                       [facility] Adult PT Treatment/Exercise - 02/22/20 0001      Self-Care   Self-Care  Other Self-Care Comments    Other Self-Care Comments   Discussion with pt. and daughter regarding rationale of each HEP stretching and strengthening activity for hopeful full adherence to initial HEP      Lumbar Exercises: Stretches   Single Knee to Chest Stretch  Right;Left;1 rep;30 seconds    Single Knee to Chest Stretch Limitations  B     Piriformis Stretch  Right;Left;1 rep;30 seconds    Piriformis  Stretch Limitations  KTOS-oppo LE bent on mat table       Lumbar Exercises: Seated   Other Seated Lumbar Exercises  Sitting B DF on mat table x 15 rpes       Knee/Hip Exercises: Seated   Clamshell with TheraBand  Red    Knee/Hip Flexion  x 10 rpes with pt. holding long red TB wrapped around thighs              PT Education - 02/22/20 1059    Education Details  HEP update;  Hook[place] SKTC stretch, KTOS stretch, LTR, sitting clam shell with red TB, sitting DF    Person(s) Educated  Patient    Methods  Explanation;Demonstration;Verbal cues;Handout    Comprehension  Verbalized understanding;Returned demonstration;Verbal cues required       PT Short Term Goals - 02/22/20 0901      PT SHORT TERM GOAL #1   Title  Patient will be independent with initial HEP    Status  On-going    Target Date  03/12/20      PT SHORT TERM GOAL #2   Title  Patient to report L hip/low back pain reduction in frequency and intensity by >/= 25-50%    Status  On-going    Target Date  03/19/20      PT SHORT TERM GOAL #3   Title  Patient will increase gait speed to >/= 2.0 ft/sec with improved posture and gait pattern to increase safety with community ambulation    Status  On-going    Target Date  03/19/20      PT SHORT TERM GOAL #4   Title  Patient will demonstrate decreased TUG time to </= 20 sec to decrease risk  for falls with transitional mobility    Status  On-going    Target Date  03/19/20        PT Long Term Goals - 02/22/20 0901      PT LONG TERM GOAL #1   Title  Patient will be independent with ongoing/advanced HEP    Status  On-going      PT LONG TERM GOAL #2   Title  Patient to report L hip/low back pain reduction in frequency and intensity by >/= 50-75%    Status  On-going      PT LONG TERM GOAL #3   Title  Patient will demonstrate improved B LE strength to >/= 4 to 4+/5 for improved stability and ease of mobility    Status  On-going      PT LONG TERM GOAL #4   Title  Patient will increase gait speed to >/= 2.62 ft/sec with good posture and normal gait pattern to increase safety with community ambulation    Status  On-going      PT LONG TERM GOAL #5   Title  Patient will demonstrate decreased TUG time to </= 13.5 sec to decrease risk for falls with transitional mobility    Status  On-going      PT LONG TERM GOAL #6   Title  Patient will improve Berg score to >/= 47/56 to improve safety stability with ADLs in standing and reduce risk for falls    Status  On-going            Plan - 02/22/20 1100    Clinical Impression Statement  Darren Mueller doing well today with vitals WFL to start session.  O2 saturation/HR closely monitored throughout therex today.  Session focused on creation of LE flexibility  and strengthening HEP with frequent cueing required from therapist with LE stretching and strengthening for pt. to stay on pace and for proper holds.  Ended visit with review of initial HEP printout with daughter and pt. to encourage adherence.  Will plan to monitor tolerance to initial HEP and adjust prn at upcoming visit.    Comorbidities  PMH significant for CHF, CAD, COPD, CKD, HTN, aortopulmonary hypertension, heart murmur, HLD, cellulitis, vertigo, seizures, gout, ETOH abuse with ETOH cirrhosis and small esophageal varices    Rehab Potential  Good    PT Treatment/Interventions   ADLs/Self Care Home Management;Cryotherapy;Electrical Stimulation;Moist Heat;DME Instruction;Gait training;Stair training;Functional mobility training;Therapeutic activities;Therapeutic exercise;Balance training;Neuromuscular re-education;Patient/family education;Manual techniques;Passive range of motion;Dry needling;Taping;Joint Manipulations    PT Next Visit Plan  Monitor VS; Create initial HEP for LE/core flexibility and postural stability/strengthening    PT Home Exercise Plan  Initial DXI:[id number] SKTC stretch, KTOS stretch, LTR, sitting clam shell with red TB, sitting DF    Consulted and Agree with Plan of Care  Patient       Patient will benefit from skilled therapeutic intervention in order to improve the following deficits and impairments:  Abnormal gait, Cardiopulmonary status limiting activity, Decreased activity tolerance, Decreased balance, Decreased coordination, Decreased endurance, Decreased knowledge of precautions, Decreased knowledge of use of DME, Decreased mobility, Decreased range of motion, Decreased safety awareness, Decreased strength, Difficulty walking, Increased edema, Increased muscle spasms, Impaired perceived functional ability, Impaired flexibility, Improper body mechanics, Postural dysfunction, Pain  Visit Diagnosis: Unsteadiness on feet  Other abnormalities of gait and mobility  Pain in left hip  Acute left-sided low back pain without sciatica  Abnormal posture     Problem List Patient Active Problem List   Diagnosis Date Noted  . Neck pain 02/10/2020  . Anorexia 02/10/2020  . AKI (acute kidney injury) ([place])   . Cellulitis of right lower extremity   . Acute exacerbation of CHF (congestive heart failure) ([place]) 01/30/2020  . CHF (congestive heart failure) ([place]) 01/20/2020  . Abdominal pain 08/23/2019  . Grief 08/23/2019  . Acute on chronic diastolic heart failure ([place]) 05/16/2019  . Acute on chronic diastolic (congestive) heart failure ([place])  05/16/2019  . Calf pain 04/03/2019  . History of colonic polyps   . Polyp of cecum   . Headache 02/21/2019  . Rectal bleeding 02/13/2019  . COPD (chronic obstructive pulmonary disease) ([place]) 02/13/2019  . Acute on chronic right heart failure ([place]) 02/13/2019  . Hematochezia   . Ascites   . Dark stools   . Alcoholism ([place])   . Hyperglycemia 05/04/2018  . Seizure disorder ([place]) 02/01/2018  . Pedal edema 10/01/2017  . RUQ pain 05/06/2017  . Decreased range of motion of left elbow 04/15/2017  . Hyperlipidemia 03/17/2017  . Elbow pain, left 03/17/2017  . Right rib fracture   . Syncope 07/19/2016  . Thrombocytopenia ([place]) 06/29/2016  . Hoarseness, chronic 06/29/2016  . Preventative health care 06/29/2016  . Chest pain   . Low back pain 12/09/2015  . Cirrhosis with alcoholism ([place]) 08/08/2015  . Pancytopenia ([place]) 06/10/2015  . Dermatitis 06/10/2015  . OSA (obstructive sleep apnea) 02/13/2015  . Gout 07/30/2014  . Diarrhea 09/04/2013  . Chronic renal insufficiency, stage III (moderate) ([place]) 07/18/2013  . Right heart failure due to pulmonary hypertension ([place]) 06/06/2013  . Hypothyroidism 01/25/2013  . PAH (pulmonary arterial hypertension) with portal hypertension ([place]) 01/21/2013  . Allergic rhinitis 12/21/2012  . Secondary pulmonary hypertension 12/09/2012  . Hepatic cirrhosis ([place]) 11/26/2012  .  GERD (gastroesophageal reflux disease) 09/05/2011  . Hypokalemia 08/29/2011  . Colon polyps 03/26/2011  . Insomnia 03/26/2011  . Hypertension 03/22/2011  . Alcohol dependence ([place]) 03/22/2011    Bess Harvest, PTA 02/22/20 12:18 PM   [facility] [address]  [place], [place], [address] Phone: [phone]   Fax:  [phone]  Name: Darren Mueller MRN: [id number] Date of Birth: 11-03-1951

## 2020-02-23 ENCOUNTER — Telehealth: Payer: Self-pay | Admitting: Medical

## 2020-02-23 ENCOUNTER — Ambulatory Visit (HOSPITAL_BASED_OUTPATIENT_CLINIC_OR_DEPARTMENT_OTHER)
Admission: RE | Admit: 2020-02-23 | Discharge: 2020-02-23 | Disposition: A | Discharge status: Home / Self Care | Payer: Medicare Other | Source: Ambulatory Visit | Attending: Internal Medicine | Admitting: Internal Medicine

## 2020-02-23 ENCOUNTER — Other Ambulatory Visit: Payer: Self-pay | Admitting: *Deleted

## 2020-02-23 ENCOUNTER — Encounter (HOSPITAL_COMMUNITY): Payer: Self-pay

## 2020-02-23 VITALS — BP 142/78 | HR 76 | Wt 192.4 lb

## 2020-02-23 DIAGNOSIS — M109 Gout, unspecified: Secondary | ICD-10-CM | POA: Insufficient documentation

## 2020-02-23 DIAGNOSIS — I5033 Acute on chronic diastolic (congestive) heart failure: Secondary | ICD-10-CM | POA: Insufficient documentation

## 2020-02-23 DIAGNOSIS — Z8379 Family history of other diseases of the digestive system: Secondary | ICD-10-CM | POA: Insufficient documentation

## 2020-02-23 DIAGNOSIS — R55 Syncope and collapse: Secondary | ICD-10-CM | POA: Insufficient documentation

## 2020-02-23 DIAGNOSIS — I130 Hypertensive heart and chronic kidney disease with heart failure and stage 1 through stage 4 chronic kidney disease, or unspecified chronic kidney disease: Secondary | ICD-10-CM | POA: Insufficient documentation

## 2020-02-23 DIAGNOSIS — K7460 Unspecified cirrhosis of liver: Secondary | ICD-10-CM | POA: Insufficient documentation

## 2020-02-23 DIAGNOSIS — R0609 Other forms of dyspnea: Secondary | ICD-10-CM

## 2020-02-23 DIAGNOSIS — R0600 Dyspnea, unspecified: Secondary | ICD-10-CM | POA: Diagnosis not present

## 2020-02-23 DIAGNOSIS — Z8249 Family history of ischemic heart disease and other diseases of the circulatory system: Secondary | ICD-10-CM | POA: Insufficient documentation

## 2020-02-23 DIAGNOSIS — K766 Portal hypertension: Secondary | ICD-10-CM

## 2020-02-23 DIAGNOSIS — Z801 Family history of malignant neoplasm of trachea, bronchus and lung: Secondary | ICD-10-CM | POA: Insufficient documentation

## 2020-02-23 DIAGNOSIS — G4733 Obstructive sleep apnea (adult) (pediatric): Secondary | ICD-10-CM | POA: Insufficient documentation

## 2020-02-23 DIAGNOSIS — I2510 Atherosclerotic heart disease of native coronary artery without angina pectoris: Secondary | ICD-10-CM | POA: Insufficient documentation

## 2020-02-23 DIAGNOSIS — R569 Unspecified convulsions: Secondary | ICD-10-CM | POA: Insufficient documentation

## 2020-02-23 DIAGNOSIS — I2721 Secondary pulmonary arterial hypertension: Secondary | ICD-10-CM

## 2020-02-23 DIAGNOSIS — D61818 Other pancytopenia: Secondary | ICD-10-CM | POA: Insufficient documentation

## 2020-02-23 DIAGNOSIS — Z79899 Other long term (current) drug therapy: Secondary | ICD-10-CM | POA: Insufficient documentation

## 2020-02-23 DIAGNOSIS — J449 Chronic obstructive pulmonary disease, unspecified: Secondary | ICD-10-CM | POA: Insufficient documentation

## 2020-02-23 DIAGNOSIS — N184 Chronic kidney disease, stage 4 (severe): Secondary | ICD-10-CM

## 2020-02-23 DIAGNOSIS — Z841 Family history of disorders of kidney and ureter: Secondary | ICD-10-CM | POA: Insufficient documentation

## 2020-02-23 DIAGNOSIS — Z87891 Personal history of nicotine dependence: Secondary | ICD-10-CM | POA: Insufficient documentation

## 2020-02-23 DIAGNOSIS — Z8042 Family history of malignant neoplasm of prostate: Secondary | ICD-10-CM | POA: Insufficient documentation

## 2020-02-23 DIAGNOSIS — E781 Pure hyperglyceridemia: Secondary | ICD-10-CM | POA: Insufficient documentation

## 2020-02-23 DIAGNOSIS — Z8719 Personal history of other diseases of the digestive system: Secondary | ICD-10-CM | POA: Insufficient documentation

## 2020-02-23 DIAGNOSIS — I5032 Chronic diastolic (congestive) heart failure: Secondary | ICD-10-CM

## 2020-02-23 DIAGNOSIS — K219 Gastro-esophageal reflux disease without esophagitis: Secondary | ICD-10-CM | POA: Insufficient documentation

## 2020-02-23 DIAGNOSIS — R519 Headache, unspecified: Secondary | ICD-10-CM | POA: Insufficient documentation

## 2020-02-23 LAB — CBC
HCT: 33.1 % — ABNORMAL LOW (ref 39.0–52.0)
Hemoglobin: 10.4 g/dL — ABNORMAL LOW (ref 13.0–17.0)
MCH: 32.5 pg (ref 26.0–34.0)
MCHC: 31.4 g/dL (ref 30.0–36.0)
MCV: 103.4 fL — ABNORMAL HIGH (ref 80.0–100.0)
Platelets: 28 10*3/uL — CL (ref 150–400)
RBC: 3.2 MIL/uL — ABNORMAL LOW (ref 4.22–5.81)
RDW: 18.1 % — ABNORMAL HIGH (ref 11.5–15.5)
WBC: 2.6 10*3/uL — ABNORMAL LOW (ref 4.0–10.5)
nRBC: 0 % (ref 0.0–0.2)

## 2020-02-23 LAB — BASIC METABOLIC PANEL
Anion gap: 13 (ref 5–15)
BUN: 42 mg/dL — ABNORMAL HIGH (ref 8–23)
CO2: 21 mmol/L — ABNORMAL LOW (ref 22–32)
Calcium: 9 mg/dL (ref 8.9–10.3)
Chloride: 101 mmol/L (ref 98–111)
Creatinine, Ser: 3.05 mg/dL — ABNORMAL HIGH (ref 0.61–1.24)
GFR calc Af Amer: 23 mL/min — ABNORMAL LOW (ref >60)
GFR calc non Af Amer: 20 mL/min — ABNORMAL LOW (ref >60)
Glucose, Bld: 104 mg/dL — ABNORMAL HIGH (ref 70–99)
Potassium: 3.8 mmol/L (ref 3.5–5.1)
Sodium: 135 mmol/L (ref 135–145)

## 2020-02-23 LAB — BRAIN NATRIURETIC PEPTIDE: B Natriuretic Peptide: 131.2 pg/mL — ABNORMAL HIGH (ref 0.0–100.0)

## 2020-02-23 NOTE — Patient Outreach (Signed)
[facility]) Care Management  02/23/2020  Darren Mueller 01/03/1951 [id number]   Spoke with [name] (daughter) who indicates pt is currently at the provider's office and she has requested a call back tomorrow to further engage.   Plan: Will rescheduled pt for a call back tomorrow to completed the initial assessment.   [name], RN Care Management Coordinator [place] Office [phone]

## 2020-02-23 NOTE — Telephone Encounter (Signed)
   Patient's son called the after hours line to complaints of what sounds like possible urinary retention - "feels like he has to urinate, but nothing comes out.". Attempted to call the patient back multiple times, however the call went to voicemail. Left a message to call the answering service back if ongoing concerns.   Abigail Butts, PA-C 02/23/20; 9:29 PM

## 2020-02-23 NOTE — Patient Instructions (Addendum)
You were referred to [facility].  They will come to your home to administer IV Lasix for 2 days.   No Torsemide for the next 2 days (today and tomorrow) THEN restart at normal dose of 59m (3 tabs) twice a day    Labs today We will only contact you if something comes back abnormal or we need to make some changes. Otherwise no news is good news!   Your physician recommends that you schedule a follow-up appointment in: 1 week with Dr BHaroldine Lawsand 2 weeks with the Nurse Practitioner  1 week [facility]: [facility] [address] 2 week appointment: Thursday May 6th, 2021 at 10:00am [place] Gar[address] Code [address]  Please call office at [phone] 2 if you have any questions or concerns.    [facility] you and your health needs are our priority. As part of our continuing mission to provide you with exceptional heart care, we have created designated Provider Care Teams. These Care Teams include your primary Cardiologist (physician) and Advanced Practice Providers (APPs- Physician Assistants and Nurse Practitioners) who all work together to provide you with the care you need, when you need it.   You may see any of the following providers on your designated Care Team at your next follow up: .Marland KitchenDr DGlori Bickers. Dr DLoralie Champagne. ADarrick Grinder NP . BLyda Jester PA . LAudry Riles PharmD   Please be sure to bring in all your medications bottles to every appointment.

## 2020-02-23 NOTE — Progress Notes (Signed)
PCP: Primary Cardiologist: Dr Haroldine Laws   HPI: Mr Glaze is a 69  year old with a history of COPD, PAH, RV failure, cirrhosis, and ETOH abuse.    Admitted in 6/17 with CP and SOB. Underwent R/L cath. Minimal CAD with moderate PAH and preserved cardiac output.   Admitted 06/2016 and 07/2016 with syncope. On September admission found to have elevated ETOH level as well as R 6th rib fracture. Wore 30 day monitor up until 08/28/16. No AF noted. No events.   Admitted February2018after syncopal event. LINQ placed.   Was admitted 4/20 with hematochezia and melena. His hemoglobin remained stable. No episodes of GI bleed after hospitalization. Underwent EGD by GI with finding of Grade 1 and a small esophageal varices, no evidence of recent bleeding,portal hypertensive gastropathy. Started on nadolol which made him very dizzy. He stopped this and feels a lot  Admitted 7/13-16/20 with volume overload. Diuresed 21 pounds. D/c weight 192.  Echo 05/17/19: LVEF >65% RV severely dilated with moderate RV dysfunction. Moderate TR RVSP 69mHG. While in hospital was pancytopenic. Possibility of BMBx discussed with Dr. SAlen Blewbut felt it was not necessary as it was felt he had splenic sequestration and counts were low but very stable.  On triple therapy for [place]i Macitentan  Adcirca   Admitted in March with increased shortness of breath. Diuresed with IV lasix and later transitioned to torsemide 60 mg twice a day. Hospital course complicated by AKI. Discharged on 01/26/20 with weight 199 pounds.    [facility] edema. Weight has gone  up 20 pounds since discharge.Says he has been taking all medications.  Admitted with cellulitis and A/C RV failure. Diuresed with IV lasix and transitioned to torsemide.  Discharge weight 184 pounds.   Today he returns for HF follow up.Overall feeling terrible. Complaining of fatigue.  SOB with exertion.  Denies  dizziness. Denies PND/Orthopnea.  Poor appetite. No fever or chills. Not able to weigh at home. His daughter set up his pill box and she says he has missed several doses of medications. She says his pill box has random pills left in the container. Taking all medications. His son, CVicente Serenelives with him.   Studies: ECHO 12/14 EF 55-60% Peak PA pressure 35. Severe RV dysfunction  ECHO 7/15 EF 60% RV moderately to severely dilated. Moderate HK RVSP 578mHG  ECHO 6/16 EF 60% RV moderately to severely dilated. Severe HK RVSP ~65 mm HG. D-shaped septum Echo 2/17 LVEF 60-65% RV massively dilated. Flat septum. Severe HK. Moderate TR RVSP ~65. IVC small. No effusion  ECHO 01/01/2017 EF 50-55% Grade I DD. RV severely dilated. Peak PA pressure 62 mm hg Echo 9/19 with EF 60-65% RV markedly dilated and hypokinetic with D-shaped septum. RVSP 6817m Echo 01/2020 EF 60-65% Grade IIDD, RVSP 83  ROS: All systems negative except as listed in HPI, PMH and Problem List.  SH:  Social History   Socioeconomic History  . Marital status: Divorced    Spouse name: Not on file  . Number of children: 3  . Years of education: Not on file  . Highest education level: Not on file  Occupational History  . Occupation: Retired    Comment: AirSocial research officer, governmentobacco Use  . Smoking status: Former Smoker    Packs/day: 2.00    Years: 5.00    Pack years: 10.00    Types: Cigarettes    Quit date: 09/22/1979    Years since quitting:  40.4  . Smokeless tobacco: Never Used  Substance and Sexual Activity  . Alcohol use: Yes    Alcohol/week: 6.0 standard drinks    Types: 6 Shots of liquor per week    Comment: social drinker  . Drug use: No  . Sexual activity: Yes    Birth control/protection: Spermicide  Other Topics Concern  . Not on file  Social History Narrative   0 caffeine drinks daily    Social Determinants of Health   Financial Resource Strain:   . Difficulty of Paying Living Expenses:   Food Insecurity:   . Worried  About Charity fundraiser in the Last Year:   . Arboriculturist in the Last Year:   Transportation Needs:   . Film/video editor (Medical):   Marland Kitchen Lack of Transportation (Non-Medical):   Physical Activity:   . Days of Exercise per Week:   . Minutes of Exercise per Session:   Stress:   . Feeling of Stress :   Social Connections:   . Frequency of Communication with Friends and Family:   . Frequency of Social Gatherings with Friends and Family:   . Attends Religious Services:   . Active Member of Clubs or Organizations:   . Attends Archivist Meetings:   Marland Kitchen Marital Status:   Intimate Partner Violence:   . Fear of Current or Ex-Partner:   . Emotionally Abused:   Marland Kitchen Physically Abused:   . Sexually Abused:     FH:  Family History  Problem Relation Age of Onset  . Heart disease Mother   . Heart attack Mother   . Hypertension Mother   . Kidney failure Mother   . Liver disease Mother        stage 4 liver disease  . Prostate cancer Father   . Cancer Father        lung  . Hypertension Brother   . Gout Brother   . Alcoholism Maternal Grandfather   . Liver disease Maternal Grandfather   . Migraines Sister   . Hypertension Brother   . Colon cancer Neg Hx     Past Medical History:  Diagnosis Date  . Alcohol dependence ([place]) 03/22/2011   In remission   . Alcohol dependence in remission ([place]) 03/22/2011   In remission   . Alcoholic cirrhosis of liver with ascites ([place]) 2014  . Anginal pain ([place])    with SOB admitted 04/20/2019  . Cardiomegaly 02/2019   Noted on CXR  . CHF (congestive heart failure) ([place])   . CKD (chronic kidney disease), stage III 07/18/2013  . COPD (chronic obstructive pulmonary disease) ([place])   . Coronary artery disease   . Dermatitis 06/10/2015  . Diarrhea 09/04/2013  . Dyspnea    admitted 04/20/2019  . Elbow pain, left 03/17/2017  . Eustachian tube dysfunction   . GERD (gastroesophageal reflux disease)   . Gout   . Headache   . Hearing loss     . Heart murmur   . Hx of colonic polyps   . Hypertension   . Hypertriglyceridemia 03/17/2017  . Hypopotassemia   . Otalgia 11/28/2013  . Other chronic pulmonary heart diseases   . PAH (pulmonary artery hypertension) ([facility])    RV failure  . Pancytopenia ([place])   . Pleural effusion 2014   Noted on CT:   Moderately large simple layering right pleural effusion   . Preventative health care 06/29/2016  . Pulmonary nodule 2014   Noted on CT: An  8 mm pulmonary nodule in the periphery of the left lower lobe  . Rectal bleeding   . Red eye 03/03/2016   right  . Seizures ([place])   . Sleep apnea    does not wear CPAP  . Splenomegaly 12/2015   Noted on CT  . Thoracic aorta atherosclerosis ([place]) 12/2017  . Thrombocytopenia ([place]) 06/29/2016  . Unspecified hypothyroidism 01/25/2013  . Unspecified pleural effusion   . Vertigo   . Wears glasses     Current Outpatient Medications  Medication Sig Dispense Refill  . acetaminophen (TYLENOL) 500 MG tablet Take 1,000 mg by mouth every 6 (six) hours as needed (for pain.).    Marland Kitchen allopurinol (ZYLOPRIM) 100 MG tablet Take 2 tablets (200 mg total) by mouth daily. 180 tablet 1  . Blood Pressure Monitoring (BLOOD PRESSURE CUFF) MISC Use as directed. Check blood pressure bid prn Dx:I10 1 each 0  . diphenoxylate-atropine (LOMOTIL) 2.5-0.025 MG tablet Take 1 tablet by mouth 4 (four) times daily as needed for diarrhea or loose stools. 40 tablet 1  . Ferrous Fumarate (HEMOCYTE) 324 (106 Fe) MG TABS tablet Take 1 tablet (106 mg of iron total) by mouth daily. 30 tablet 5  . folic acid (FOLVITE) 1 MG tablet Take 1 tablet (1 mg total) by mouth daily. 30 tablet 5  . hyoscyamine (LEVSIN SL) 0.125 MG SL tablet Place 1 tablet (0.125 mg total) under the tongue every 6 (six) hours as needed. (Patient taking differently: Place 0.125 mg under the tongue every 6 (six) hours as needed for cramping. ) 30 tablet 1  . macitentan (OPSUMIT) 10 MG tablet Take 1 tablet (10 mg total) by mouth  daily. 90 tablet 3  . Multiple Vitamin (MULTIVITAMIN) tablet Take 1 tablet by mouth daily.    Marland Kitchen omeprazole (PRILOSEC) 40 MG capsule TAKE 1 CAPSULE BY MOUTH ONCE DAILY (Patient taking differently: Take 40 mg by mouth daily. ) 90 capsule 0  . Potassium Chloride ER 20 MEQ TBCR Take 40 mEq by mouth 2 (two) times daily.    . rosuvastatin (CRESTOR) 5 MG tablet TAKE 1 TABLET DAILY (Patient taking differently: Take 5 mg by mouth daily at 6 PM. ) 90 tablet 3  . Selexipag (UPTRAVI) 1000 MCG TABS Take 1,000 mcg by mouth 2 (two) times daily. 60 tablet 11  . SUMAtriptan (IMITREX) 50 MG tablet Take 1 tablet (50 mg total) by mouth daily as needed for migraine or headache. 10 tablet 1  . tadalafil, PAH, (ADCIRCA) 20 MG tablet Take 2 tablets (40 mg total) by mouth daily. 180 tablet 1  . thiamine 100 MG tablet Take 1 tablet (100 mg total) by mouth daily. 30 tablet 5  . torsemide (DEMADEX) 20 MG tablet Take 3 tablets (60 mg total) by mouth 2 (two) times daily. 180 tablet 5   No current facility-administered medications for this encounter.    Vitals:   02/23/20 1038  BP: (!) 142/78  Pulse: 76  SpO2: 99%  Weight: 87.3 kg (192 lb 6.4 oz)   Wt Readings from Last 3 Encounters:  02/23/20 87.3 kg (192 lb 6.4 oz)  02/10/20 85.8 kg (189 lb 3.2 oz)  02/09/20 84.6 kg (186 lb 9.6 oz)   PHYSICAL EXAM: General:  Appears weak. Walking slowly in the clinic. Mild dyspnea on arrival.  HEENT: normal Neck: supple. JVP 11-12 . Carotids 2+ bilat; no bruits. No lymphadenopathy or thryomegaly appreciated. Cor: PMI nondisplaced. Regular rate & rhythm. No rubs, gallops or murmurs. Lungs: clear Abdomen: soft,  nontender, nondistended. No hepatosplenomegaly. No bruits or masses. Good bowel sounds. Extremities: no cyanosis, clubbing, rash, R and LLE 2+ edema Neuro: alert & orientedx3, cranial nerves grossly intact. moves all 4 extremities w/o difficulty. Affect pleasant     ASSESSMENT & PLAN:  1. Acute on Chronic Diastolic  Heart Failure  ECHO 01/2020 EF 60-65% Grade IIDD NYHA III. Volume status elevated suspect in the setting of medication errors. He has missed various medications over the last week.  Will refer to [facility] and give 80 gm IV lasix 4/22 and 4/23.  On 4/24/1 he will start torsemide 60 mg twice a day.  Check BNP, BMET today.   2. PAH ECHO 01/2020 EF 60-65% RVSP 83 Grade II DD On triple therapy- selexipag 1000 mcg twice a day. Intolerant higher dose due to diarrhea and headaches . - Continue macitentan 10 daily and adcirca 40 mg daily.  3. CKD Stage IV  Followed Dr Arty Baumgartner.  Creatinine baseline 2.5-3 Check BMET today.   4. Headaches  No headaches reported today. - he has chronic pancytopenia due to liver disease (followed by Dr. Alen Blew) - Started on immitrex recent hospitalization.   5.  Recurrent syncope/seizure - LINQ interrogations have shown no arrhythmias and thus most likely seizure - Continue Keppra. Follows with Dr. Delice Lesch  6. OSA:  - Has mild OSA with AHI 12 and desats down to 82%.  - Intolerant CPAP.  7. Pancytopenia:  - Has seen Dr. Alen Blew in Hematology - felt like it is due to   Referred [facility] for IV lasix and close follow up over the next few days. I personally called Dr Tarri Abernethy with [facility].  Greater than 50% of the (total minutes 25) visit spent in counseling/coordination of care regarding the above.       Alfhild Partch NP-C  10:54 AM

## 2020-02-24 ENCOUNTER — Other Ambulatory Visit: Payer: Self-pay | Admitting: *Deleted

## 2020-02-24 DIAGNOSIS — I5033 Acute on chronic diastolic (congestive) heart failure: Secondary | ICD-10-CM | POA: Diagnosis not present

## 2020-02-24 DIAGNOSIS — N183 Chronic kidney disease, stage 3 unspecified: Secondary | ICD-10-CM | POA: Diagnosis not present

## 2020-02-24 DIAGNOSIS — I509 Heart failure, unspecified: Secondary | ICD-10-CM | POA: Diagnosis not present

## 2020-02-24 NOTE — Patient Outreach (Signed)
[facility]) Care Management  02/24/2020  Darren Mueller [birth year] [phone]  Telephone Assessment   RN spoke with sister Darren Mueller concerning completing of the initial assessment as scheduled for today however pt is currently working with a therapist. RN inquired on another time to follow up for this information. Caregiver sister requested a call back on Monday at 12:00pm. RN will follow up accordingly with plans to completed the initial assessment and further inquired on any potential needs that have not been addressed at this time. No other issues or concerns.  Plan: Will follow up on Monday at 12 pm as requested.  [name], RN Care Management Coordinator [place] Office [phone]

## 2020-02-25 ENCOUNTER — Encounter (HOSPITAL_COMMUNITY): Payer: Self-pay | Admitting: Internal Medicine

## 2020-02-25 ENCOUNTER — Emergency Department (HOSPITAL_COMMUNITY): Payer: Medicare Other

## 2020-02-25 ENCOUNTER — Inpatient Hospital Stay (HOSPITAL_COMMUNITY)
Admission: EM | Admit: 2020-02-25 | Discharge: 2020-02-29 | DRG: 441 | Disposition: A | Discharge status: Home Health | Payer: Medicare Other | Attending: Family Medicine | Admitting: Family Medicine

## 2020-02-25 ENCOUNTER — Other Ambulatory Visit: Payer: Self-pay

## 2020-02-25 DIAGNOSIS — R519 Headache, unspecified: Secondary | ICD-10-CM | POA: Diagnosis present

## 2020-02-25 DIAGNOSIS — K7031 Alcoholic cirrhosis of liver with ascites: Secondary | ICD-10-CM | POA: Diagnosis present

## 2020-02-25 DIAGNOSIS — N184 Chronic kidney disease, stage 4 (severe): Secondary | ICD-10-CM | POA: Diagnosis present

## 2020-02-25 DIAGNOSIS — Z86010 Personal history of colonic polyps: Secondary | ICD-10-CM

## 2020-02-25 DIAGNOSIS — K7290 Hepatic failure, unspecified without coma: Principal | ICD-10-CM | POA: Diagnosis present

## 2020-02-25 DIAGNOSIS — M109 Gout, unspecified: Secondary | ICD-10-CM | POA: Diagnosis present

## 2020-02-25 DIAGNOSIS — Z66 Do not resuscitate: Secondary | ICD-10-CM

## 2020-02-25 DIAGNOSIS — K219 Gastro-esophageal reflux disease without esophagitis: Secondary | ICD-10-CM | POA: Diagnosis present

## 2020-02-25 DIAGNOSIS — Z79899 Other long term (current) drug therapy: Secondary | ICD-10-CM

## 2020-02-25 DIAGNOSIS — D696 Thrombocytopenia, unspecified: Secondary | ICD-10-CM | POA: Diagnosis present

## 2020-02-25 DIAGNOSIS — I10 Essential (primary) hypertension: Secondary | ICD-10-CM | POA: Diagnosis present

## 2020-02-25 DIAGNOSIS — E039 Hypothyroidism, unspecified: Secondary | ICD-10-CM | POA: Diagnosis present

## 2020-02-25 DIAGNOSIS — E785 Hyperlipidemia, unspecified: Secondary | ICD-10-CM | POA: Diagnosis present

## 2020-02-25 DIAGNOSIS — Z87891 Personal history of nicotine dependence: Secondary | ICD-10-CM

## 2020-02-25 DIAGNOSIS — R4182 Altered mental status, unspecified: Secondary | ICD-10-CM | POA: Diagnosis not present

## 2020-02-25 DIAGNOSIS — Z7189 Other specified counseling: Secondary | ICD-10-CM

## 2020-02-25 DIAGNOSIS — R188 Other ascites: Secondary | ICD-10-CM

## 2020-02-25 DIAGNOSIS — I2721 Secondary pulmonary arterial hypertension: Secondary | ICD-10-CM | POA: Diagnosis present

## 2020-02-25 DIAGNOSIS — K7460 Unspecified cirrhosis of liver: Secondary | ICD-10-CM | POA: Diagnosis present

## 2020-02-25 DIAGNOSIS — I5082 Biventricular heart failure: Secondary | ICD-10-CM | POA: Diagnosis present

## 2020-02-25 DIAGNOSIS — Z20822 Contact with and (suspected) exposure to covid-19: Secondary | ICD-10-CM | POA: Diagnosis present

## 2020-02-25 DIAGNOSIS — K7682 Hepatic encephalopathy: Secondary | ICD-10-CM | POA: Diagnosis present

## 2020-02-25 DIAGNOSIS — G4733 Obstructive sleep apnea (adult) (pediatric): Secondary | ICD-10-CM | POA: Diagnosis present

## 2020-02-25 DIAGNOSIS — D61818 Other pancytopenia: Secondary | ICD-10-CM | POA: Diagnosis present

## 2020-02-25 DIAGNOSIS — Z03818 Encounter for observation for suspected exposure to other biological agents ruled out: Secondary | ICD-10-CM | POA: Diagnosis not present

## 2020-02-25 DIAGNOSIS — Z886 Allergy status to analgesic agent status: Secondary | ICD-10-CM

## 2020-02-25 DIAGNOSIS — I517 Cardiomegaly: Secondary | ICD-10-CM | POA: Diagnosis not present

## 2020-02-25 DIAGNOSIS — Z515 Encounter for palliative care: Secondary | ICD-10-CM

## 2020-02-25 DIAGNOSIS — K7030 Alcoholic cirrhosis of liver without ascites: Secondary | ICD-10-CM | POA: Diagnosis present

## 2020-02-25 DIAGNOSIS — F1020 Alcohol dependence, uncomplicated: Secondary | ICD-10-CM | POA: Diagnosis present

## 2020-02-25 DIAGNOSIS — I5032 Chronic diastolic (congestive) heart failure: Secondary | ICD-10-CM | POA: Diagnosis present

## 2020-02-25 DIAGNOSIS — J449 Chronic obstructive pulmonary disease, unspecified: Secondary | ICD-10-CM | POA: Diagnosis present

## 2020-02-25 DIAGNOSIS — I5033 Acute on chronic diastolic (congestive) heart failure: Secondary | ICD-10-CM | POA: Diagnosis present

## 2020-02-25 DIAGNOSIS — F1021 Alcohol dependence, in remission: Secondary | ICD-10-CM | POA: Diagnosis present

## 2020-02-25 DIAGNOSIS — R531 Weakness: Secondary | ICD-10-CM | POA: Diagnosis not present

## 2020-02-25 DIAGNOSIS — K766 Portal hypertension: Secondary | ICD-10-CM | POA: Diagnosis present

## 2020-02-25 DIAGNOSIS — G40909 Epilepsy, unspecified, not intractable, without status epilepticus: Secondary | ICD-10-CM

## 2020-02-25 DIAGNOSIS — I130 Hypertensive heart and chronic kidney disease with heart failure and stage 1 through stage 4 chronic kidney disease, or unspecified chronic kidney disease: Secondary | ICD-10-CM | POA: Diagnosis present

## 2020-02-25 DIAGNOSIS — I2510 Atherosclerotic heart disease of native coronary artery without angina pectoris: Secondary | ICD-10-CM | POA: Diagnosis present

## 2020-02-25 DIAGNOSIS — Z888 Allergy status to other drugs, medicaments and biological substances status: Secondary | ICD-10-CM

## 2020-02-25 DIAGNOSIS — R040 Epistaxis: Secondary | ICD-10-CM | POA: Diagnosis not present

## 2020-02-25 DIAGNOSIS — R630 Anorexia: Secondary | ICD-10-CM | POA: Diagnosis present

## 2020-02-25 LAB — HIV ANTIBODY (ROUTINE TESTING W REFLEX): HIV Screen 4th Generation wRfx: NONREACTIVE

## 2020-02-25 LAB — COMPREHENSIVE METABOLIC PANEL
ALT: 36 U/L (ref 0–44)
AST: 83 U/L — ABNORMAL HIGH (ref 15–41)
Albumin: 3.1 g/dL — ABNORMAL LOW (ref 3.5–5.0)
Alkaline Phosphatase: 271 U/L — ABNORMAL HIGH (ref 38–126)
Anion gap: 13 (ref 5–15)
BUN: 48 mg/dL — ABNORMAL HIGH (ref 8–23)
CO2: 21 mmol/L — ABNORMAL LOW (ref 22–32)
Calcium: 9.3 mg/dL (ref 8.9–10.3)
Chloride: 104 mmol/L (ref 98–111)
Creatinine, Ser: 2.94 mg/dL — ABNORMAL HIGH (ref 0.61–1.24)
GFR calc Af Amer: 24 mL/min — ABNORMAL LOW (ref >60)
GFR calc non Af Amer: 21 mL/min — ABNORMAL LOW (ref >60)
Glucose, Bld: 127 mg/dL — ABNORMAL HIGH (ref 70–99)
Potassium: 3.7 mmol/L (ref 3.5–5.1)
Sodium: 138 mmol/L (ref 135–145)
Total Bilirubin: 2.7 mg/dL — ABNORMAL HIGH (ref 0.3–1.2)
Total Protein: 6.8 g/dL (ref 6.5–8.1)

## 2020-02-25 LAB — CBC WITH DIFFERENTIAL/PLATELET
Abs Immature Granulocytes: 0.01 10*3/uL (ref 0.00–0.07)
Basophils Absolute: 0 10*3/uL (ref 0.0–0.1)
Basophils Relative: 0 %
Eosinophils Absolute: 0 10*3/uL (ref 0.0–0.5)
Eosinophils Relative: 1 %
HCT: 34.3 % — ABNORMAL LOW (ref 39.0–52.0)
Hemoglobin: 10.6 g/dL — ABNORMAL LOW (ref 13.0–17.0)
Immature Granulocytes: 0 %
Lymphocytes Relative: 22 %
Lymphs Abs: 0.6 10*3/uL — ABNORMAL LOW (ref 0.7–4.0)
MCH: 32.8 pg (ref 26.0–34.0)
MCHC: 30.9 g/dL (ref 30.0–36.0)
MCV: 106.2 fL — ABNORMAL HIGH (ref 80.0–100.0)
Monocytes Absolute: 0.4 10*3/uL (ref 0.1–1.0)
Monocytes Relative: 14 %
Neutro Abs: 1.7 10*3/uL (ref 1.7–7.7)
Neutrophils Relative %: 63 %
Platelets: 26 10*3/uL — CL (ref 150–400)
RBC: 3.23 MIL/uL — ABNORMAL LOW (ref 4.22–5.81)
RDW: 18.7 % — ABNORMAL HIGH (ref 11.5–15.5)
WBC: 2.7 10*3/uL — ABNORMAL LOW (ref 4.0–10.5)
nRBC: 0 % (ref 0.0–0.2)

## 2020-02-25 LAB — URINALYSIS, ROUTINE W REFLEX MICROSCOPIC
Bilirubin Urine: NEGATIVE
Glucose, UA: NEGATIVE mg/dL
Hgb urine dipstick: NEGATIVE
Ketones, ur: NEGATIVE mg/dL
Leukocytes,Ua: NEGATIVE
Nitrite: NEGATIVE
Protein, ur: NEGATIVE mg/dL
Specific Gravity, Urine: 1.015 (ref 1.005–1.030)
pH: 5 (ref 5.0–8.0)

## 2020-02-25 LAB — BRAIN NATRIURETIC PEPTIDE: B Natriuretic Peptide: 95.9 pg/mL (ref 0.0–100.0)

## 2020-02-25 LAB — PROTIME-INR
INR: 1.2 (ref 0.8–1.2)
Prothrombin Time: 15.4 seconds — ABNORMAL HIGH (ref 11.4–15.2)

## 2020-02-25 LAB — RESPIRATORY PANEL BY RT PCR (FLU A&B, COVID)
Influenza A by PCR: NEGATIVE
Influenza B by PCR: NEGATIVE
SARS Coronavirus 2 by RT PCR: NEGATIVE

## 2020-02-25 LAB — ETHANOL: Alcohol, Ethyl (B): <10 mg/dL (ref <10)

## 2020-02-25 LAB — AMMONIA: Ammonia: 112 umol/L — ABNORMAL HIGH (ref 9–35)

## 2020-02-25 LAB — CBG MONITORING, ED: Glucose-Capillary: 107 mg/dL — ABNORMAL HIGH (ref 70–99)

## 2020-02-25 MED ORDER — TRAZODONE HCL 50 MG PO TABS
50.0000 mg | ORAL_TABLET | Freq: Once | ORAL | Status: AC
  Administered 2020-02-25: 50 mg via ORAL
  Filled 2020-02-25: qty 1

## 2020-02-25 MED ORDER — THIAMINE HCL 100 MG PO TABS
100.0000 mg | ORAL_TABLET | Freq: Every day | ORAL | Status: DC
  Administered 2020-02-26 – 2020-02-29 (×4): 100 mg via ORAL
  Filled 2020-02-25 (×4): qty 1

## 2020-02-25 MED ORDER — ONDANSETRON HCL 4 MG PO TABS: 4.0000 mg | ORAL_TABLET | Freq: Four times a day (QID) | ORAL | Status: DC | PRN

## 2020-02-25 MED ORDER — TORSEMIDE 20 MG PO TABS
60.0000 mg | ORAL_TABLET | Freq: Two times a day (BID) | ORAL | Status: DC
  Administered 2020-02-25 – 2020-02-29 (×7): 60 mg via ORAL
  Filled 2020-02-25 (×7): qty 3

## 2020-02-25 MED ORDER — LACTULOSE 10 GM/15ML PO SOLN
30.0000 g | Freq: Once | ORAL | Status: AC
  Administered 2020-02-25: 30 g via ORAL
  Filled 2020-02-25: qty 45

## 2020-02-25 MED ORDER — PANTOPRAZOLE SODIUM 40 MG PO TBEC
40.0000 mg | DELAYED_RELEASE_TABLET | Freq: Every day | ORAL | Status: DC
  Administered 2020-02-26 – 2020-02-29 (×4): 40 mg via ORAL
  Filled 2020-02-25 (×4): qty 1

## 2020-02-25 MED ORDER — ROSUVASTATIN CALCIUM 5 MG PO TABS
5.0000 mg | ORAL_TABLET | Freq: Every day | ORAL | Status: DC
  Administered 2020-02-25 – 2020-02-28 (×4): 5 mg via ORAL
  Filled 2020-02-25 (×4): qty 1

## 2020-02-25 MED ORDER — MACITENTAN 10 MG PO TABS
10.0000 mg | ORAL_TABLET | Freq: Every day | ORAL | Status: DC
  Administered 2020-02-25 – 2020-02-29 (×5): 10 mg via ORAL
  Filled 2020-02-25 (×5): qty 1

## 2020-02-25 MED ORDER — ONDANSETRON HCL 4 MG/2ML IJ SOLN: 4.0000 mg | Freq: Four times a day (QID) | INTRAMUSCULAR | Status: DC | PRN

## 2020-02-25 MED ORDER — POTASSIUM CHLORIDE CRYS ER 20 MEQ PO TBCR
40.0000 meq | EXTENDED_RELEASE_TABLET | Freq: Two times a day (BID) | ORAL | Status: DC
  Administered 2020-02-25 – 2020-02-29 (×8): 40 meq via ORAL
  Filled 2020-02-25 (×2): qty 4
  Filled 2020-02-25 (×7): qty 2

## 2020-02-25 MED ORDER — SELEXIPAG 1000 MCG PO TABS
1000.0000 ug | ORAL_TABLET | Freq: Two times a day (BID) | ORAL | Status: DC
  Administered 2020-02-25 – 2020-02-29 (×8): 1000 ug via ORAL
  Filled 2020-02-25 (×8): qty 1

## 2020-02-25 MED ORDER — HYDRALAZINE HCL 20 MG/ML IJ SOLN: 5.0000 mg | INTRAMUSCULAR | Status: DC | PRN

## 2020-02-25 MED ORDER — MORPHINE SULFATE (PF) 2 MG/ML IV SOLN: 2.0000 mg | INTRAVENOUS | Status: DC | PRN

## 2020-02-25 MED ORDER — HYDROCODONE-ACETAMINOPHEN 5-325 MG PO TABS
1.0000 | ORAL_TABLET | ORAL | Status: DC | PRN
  Administered 2020-02-26 – 2020-02-29 (×4): 1 via ORAL
  Filled 2020-02-25 (×4): qty 1

## 2020-02-25 MED ORDER — ADULT MULTIVITAMIN W/MINERALS CH
1.0000 | ORAL_TABLET | Freq: Every day | ORAL | Status: DC
  Administered 2020-02-26 – 2020-02-27 (×2): 1 via ORAL
  Filled 2020-02-25 (×2): qty 1

## 2020-02-25 MED ORDER — FOLIC ACID 1 MG PO TABS
1.0000 mg | ORAL_TABLET | Freq: Every day | ORAL | Status: DC
  Administered 2020-02-26 – 2020-02-29 (×4): 1 mg via ORAL
  Filled 2020-02-25 (×4): qty 1

## 2020-02-25 MED ORDER — FERROUS FUMARATE 324 (106 FE) MG PO TABS
1.0000 | ORAL_TABLET | Freq: Every day | ORAL | Status: DC
  Administered 2020-02-25 – 2020-02-29 (×5): 106 mg via ORAL
  Filled 2020-02-25 (×5): qty 1

## 2020-02-25 MED ORDER — TADALAFIL (PAH) 20 MG PO TABS
40.0000 mg | ORAL_TABLET | Freq: Every day | ORAL | Status: DC
  Administered 2020-02-26 – 2020-02-29 (×4): 40 mg via ORAL
  Filled 2020-02-25 (×5): qty 2

## 2020-02-25 MED ORDER — ONDANSETRON HCL 4 MG/2ML IJ SOLN: 4.0000 mg | Freq: Once | INTRAMUSCULAR | Status: DC

## 2020-02-25 MED ORDER — ALLOPURINOL 100 MG PO TABS
200.0000 mg | ORAL_TABLET | Freq: Every day | ORAL | Status: DC
  Administered 2020-02-26 – 2020-02-27 (×2): 200 mg via ORAL
  Filled 2020-02-25 (×2): qty 2

## 2020-02-25 MED ORDER — LACTULOSE 10 GM/15ML PO SOLN
30.0000 g | Freq: Two times a day (BID) | ORAL | Status: DC | PRN
  Filled 2020-02-25 (×3): qty 45

## 2020-02-25 NOTE — H&P (Addendum)
History and Physical    Darren Mueller UKG:[id number] DOB: [birth year] DOA: 02/25/2020  PCP: Mosie Lukes, MD Consultants:  [place] - cardiology; [place] - oncology; Delice Lesch - neurology; [place] - nephrology Patient coming from:  Home - lives with son; NOK: Daughter, Darren Mueller, [phone]  Chief Complaint: AMS  HPI: Darren Mueller is a 69 y.o. male with medical history significant of hypothyroidism; OSA not on CPAP; seizures; HLD; HTN; CAD; COPD; stage 3 CKD; alcoholic cirrhosis with ascites; h/o ETOH dependence; and chronic diastolic CHF presenting with AMS.  His daughter reports that he has been very confused, disoriented, weaker than normal.  Yesterday, he walked with OT but today he was slumped over the table and unable to stand on his own.  Also not oriented even to person in the ER.  He has never had this issue before and does not take lactulose at home.  He was perfectly himself yesterday.  He does have known alcoholic liver disease with h/o ascites.  No ETOH in the last 2 months.   ED Course:  Hepatic encephalopathy.  Normal yesterday, today weak and unable to stand, confused.  NH4 115.  Pancytopenia, appears to be at baseline.  Review of Systems: As per HPI; otherwise review of systems reviewed and negative.   Ambulatory Status:  Ambulates without assistance  COVID Vaccine Status:  Complete  Past Medical History:  Diagnosis Date  . Alcohol dependence in remission ([place]) 03/22/2011   In remission   . Alcoholic cirrhosis of liver with ascites ([place]) 2014  . Anginal pain ([place])    with SOB admitted 04/20/2019  . Cardiomegaly 02/2019   Noted on CXR  . CHF (congestive heart failure) ([place])   . CKD (chronic kidney disease), stage III 07/18/2013  . COPD (chronic obstructive pulmonary disease) ([place])   . Coronary artery disease   . GERD (gastroesophageal reflux disease)   . Gout   . Hearing loss   . Hypertension   . Hypertriglyceridemia 03/17/2017  . PAH (pulmonary artery  hypertension) (HCC)    RV failure  . Pancytopenia ([place])   . Pleural effusion 2014   Noted on CT:   Moderately large simple layering right pleural effusion   . Pulmonary nodule 2014   Noted on CT: An 8 mm pulmonary nodule in the periphery of the left lower lobe  . Rectal bleeding   . Seizures ([place])   . Sleep apnea    does not wear CPAP  . Splenomegaly 12/2015   Noted on CT  . Thoracic aorta atherosclerosis ([place]) 12/2017  . Thrombocytopenia ([place]) 06/29/2016  . Unspecified hypothyroidism 01/25/2013  . Vertigo     Past Surgical History:  Procedure Laterality Date  . CARDIAC CATHETERIZATION N/A 12/21/2015   Procedure: Right Heart Cath;  Surgeon: Jolaine Artist, MD;  Location: [place] [facility];  Service: Cardiovascular;  Laterality: N/A;  . CARDIAC CATHETERIZATION N/A 04/15/2016   Procedure: Right/Left Heart Cath and Coronary Angiography;  Surgeon: Jolaine Artist, MD;  Location: [place] [facility];  Service: Cardiovascular;  Laterality: N/A;  . COLONOSCOPY N/A 12/08/2013   Procedure: COLONOSCOPY;  Surgeon: Milus Banister, MD;  Location: [facility];  Service: Endoscopy;  Laterality: N/A;  . COLONOSCOPY WITH PROPOFOL N/A 03/24/2019   Procedure: COLONOSCOPY WITH PROPOFOL;  Surgeon: Milus Banister, MD;  Location: [facility];  Service: Endoscopy;  Laterality: N/A;  Draw CBC in preop  . COLONOSCOPY WITH PROPOFOL N/A 04/28/2019   Procedure: COLONOSCOPY WITH PROPOFOL;  Surgeon: Ardis Hughs,  Melene Plan, MD;  Location: [facility];  Service: Endoscopy;  Laterality: N/A;  . ESOPHAGOGASTRODUODENOSCOPY (EGD) WITH PROPOFOL N/A 03/27/2016   Procedure: ESOPHAGOGASTRODUODENOSCOPY (EGD) WITH PROPOFOL;  Surgeon: Milus Banister, MD;  Location: [place];  Service: Endoscopy;  Laterality: N/A;  . ESOPHAGOGASTRODUODENOSCOPY (EGD) WITH PROPOFOL N/A 02/14/2019   Procedure: ESOPHAGOGASTRODUODENOSCOPY (EGD) WITH PROPOFOL;  Surgeon: Jerene Bears, MD;  Location[facility];  Service: Gastroenterology;   Laterality: N/A;  . EYE SURGERY Bilateral 03/15/2019   lasix  . GAS/FLUID EXCHANGE Right 09/22/2019   Procedure: GAS/FLUID EXCHANGE (C3F8) RIGHT EYE;  Surgeon: Sherlynn Stalls, MD;  Location: [place];  Service: Ophthalmology;  Laterality: Right;  . HEMOSTASIS CLIP PLACEMENT  04/28/2019   Procedure: HEMOSTASIS CLIP PLACEMENT;  Surgeon: Milus Banister, MD;  Location: [facility];  Service: Endoscopy;;  . LOOP RECORDER INSERTION N/A 01/01/2017   Procedure: Loop Recorder Insertion;  Surgeon: Thompson Grayer, MD;  Location: [place] [facility];  Service: Cardiovascular;  Laterality: N/A;  . PARS PLANA VITRECTOMY Right 09/22/2019   Procedure: PARS PLANA VITRECTOMY WITH 25 GAUGE RIGHT EYE;  Surgeon: Sherlynn Stalls, MD;  Location: [place];  Service: Ophthalmology;  Laterality: Right;  . PHOTOCOAGULATION WITH LASER Right 09/22/2019   Procedure: PHOTOCOAGULATION WITH LASER RIGHT EYE;  Surgeon: Sherlynn Stalls, MD;  Location: [place];  Service: Ophthalmology;  Laterality: Right;  . POLYPECTOMY  04/28/2019   Procedure: POLYPECTOMY;  Surgeon: Milus Banister, MD;  Location: [facility];  Service: Endoscopy;;  . RIGHT HEART CATHETERIZATION Right 06/06/2014   Procedure: RIGHT HEART CATH;  Surgeon: Jolaine Artist, MD;  Location: [facility];  Service: Cardiovascular;  Laterality: Right;  . UVULOPALATOPHARYNGOPLASTY  1999    Social History   Socioeconomic History  . Marital status: Divorced    Spouse name: Not on file  . Number of children: 3  . Years of education: Not on file  . Highest education level: Not on file  Occupational History  . Occupation: Retired    Comment: Social research officer, government  Tobacco Use  . Smoking status: Former Smoker    Packs/day: 2.00    Years: 5.00    Pack years: 10.00    Types: Cigarettes    Quit date: 09/22/1979    Years since quitting: 40.4  . Smokeless tobacco: Never Used  Substance and Sexual Activity  . Alcohol use: Yes    Alcohol/week: 6.0 standard drinks    Types: 6 Shots of  liquor per week    Comment: quit drinking in 12/2019  . Drug use: No  . Sexual activity: Yes    Birth control/protection: Spermicide  Other Topics Concern  . Not on file  Social History Narrative   0 caffeine drinks daily    Social Determinants of Health   Financial Resource Strain:   . Difficulty of Paying Living Expenses:   Food Insecurity:   . Worried About Charity fundraiser in the Last Year:   . Arboriculturist in the Last Year:   Transportation Needs:   . Film/video editor (Medical):   Marland Kitchen Lack of Transportation (Non-Medical):   Physical Activity:   . Days of Exercise per Week:   . Minutes of Exercise per Session:   Stress:   . Feeling of Stress :   Social Connections:   . Frequency of Communication with Friends and Family:   . Frequency of Social Gatherings with Friends and Family:   . Attends Religious Services:   . Active Member of  Clubs or Organizations:   . Attends Archivist Meetings:   Marland Kitchen Marital Status:   Intimate Partner Violence:   . Fear of Current or Ex-Partner:   . Emotionally Abused:   Marland Kitchen Physically Abused:   . Sexually Abused:     Allergies  Allergen Reactions  . Aspirin Hypertension  . Nitroglycerin Other (See Comments)    Severe Headache    Family History  Problem Relation Age of Onset  . Heart disease Mother   . Heart attack Mother   . Hypertension Mother   . Kidney failure Mother   . Liver disease Mother        stage 4 liver disease  . Prostate cancer Father   . Cancer Father        lung  . Hypertension Brother   . Gout Brother   . Alcoholism Maternal Grandfather   . Liver disease Maternal Grandfather   . Migraines Sister   . Hypertension Brother   . Colon cancer Neg Hx     Prior to Admission medications   Medication Sig Start Date End Date Taking? Authorizing Provider  acetaminophen (TYLENOL) 500 MG tablet Take 1,000 mg by mouth every 6 (six) hours as needed (for pain.).    [provider]  allopurinol  (ZYLOPRIM) 100 MG tablet Take 2 tablets (200 mg total) by mouth daily. 07/12/19   Mosie Lukes, MD  Blood Pressure Monitoring (BLOOD PRESSURE CUFF) MISC Use as directed. Check blood pressure bid prn Dx:I10 02/22/19   Mosie Lukes, MD  diphenoxylate-atropine (LOMOTIL) 2.5-0.025 MG tablet Take 1 tablet by mouth 4 (four) times daily as needed for diarrhea or loose stools. 12/01/19   Mosie Lukes, MD  Ferrous Fumarate (HEMOCYTE) 324 (106 Fe) MG TABS tablet Take 1 tablet (106 mg of iron total) by mouth daily. 05/05/19   Mosie Lukes, MD  folic acid (FOLVITE) 1 MG tablet Take 1 tablet (1 mg total) by mouth daily. 01/11/20   Saguier, Percell Miller, PA-C  hyoscyamine (LEVSIN SL) 0.125 MG SL tablet Place 1 tablet (0.125 mg total) under the tongue every 6 (six) hours as needed. Patient taking differently: Place 0.125 mg under the tongue every 6 (six) hours as needed for cramping.  08/23/19   Mosie Lukes, MD  macitentan (OPSUMIT) 10 MG tablet Take 1 tablet (10 mg total) by mouth daily. 09/26/19   Bensimhon, Shaune Pascal, MD  Multiple Vitamin (MULTIVITAMIN) tablet Take 1 tablet by mouth daily.    [provider]  omeprazole (PRILOSEC) 40 MG capsule TAKE 1 CAPSULE BY MOUTH ONCE DAILY Patient taking differently: Take 40 mg by mouth daily.  11/14/19   Saguier, Percell Miller, PA-C  Potassium Chloride ER 20 MEQ TBCR Take 40 mEq by mouth 2 (two) times daily. 02/05/20   Domenic Polite, MD  rosuvastatin (CRESTOR) 5 MG tablet TAKE 1 TABLET DAILY Patient taking differently: Take 5 mg by mouth daily at 6 PM.  08/31/19   Mosie Lukes, MD  Selexipag (UPTRAVI) 1000 MCG TABS Take 1,000 mcg by mouth 2 (two) times daily. 06/03/19   Bensimhon, Shaune Pascal, MD  SUMAtriptan (IMITREX) 50 MG tablet Take 1 tablet (50 mg total) by mouth daily as needed for migraine or headache. 02/09/20 03/10/20  Clegg, Amy D, NP  tadalafil, PAH, (ADCIRCA) 20 MG tablet Take 2 tablets (40 mg total) by mouth daily. 11/18/19   Bensimhon, Shaune Pascal, MD  thiamine  100 MG tablet Take 1 tablet (100 mg total) by mouth daily.  05/05/19   Mosie Lukes, MD  torsemide (DEMADEX) 20 MG tablet Take 3 tablets (60 mg total) by mouth 2 (two) times daily. 01/28/20   Mercy Riding, MD    Physical Exam: Vitals:   02/25/20 1300 02/25/20 1315 02/25/20 1330 02/25/20 1430  BP: (!) 117/59 109/65 114/61 130/73  Pulse: 85 79 77 83  Resp: 18 20 (!) 26 19  Temp:      TempSrc:      SpO2: 100% 100% 100% 100%     . General:  Appears calm and comfortable and is NAD, pleasant, mildly confused . Eyes:  PERRL, EOMI, normal lids, iris . ENT:  grossly normal hearing, lips & tongue, mmm . Neck:  no LAD, masses or thyromegaly . Cardiovascular:  RRR, no m/r/g. Marked, 3+ chronic LE edema.  Marland Kitchen Respiratory:   CTA bilaterally with no wheezes/rales/rhonchi.  Normal respiratory effort. . Abdomen:  soft, NT, ND, NABS, no significant ascites . Skin:  no rash or induration seen on limited exam . Musculoskeletal:  grossly normal tone BUE/BLE, good ROM, no bony abnormality . Psychiatric:  grossly normal mood and affect, speech fluent and appropriate, AOx2 (completely unable to come up with year, but this is improved from arrival when he couldn't say his first and last name) . Neurologic:  CN 2-12 grossly intact, moves all extremities in coordinated fashion    Radiological Exams on Admission: DG Chest 2 View  Result Date: 02/25/2020 CLINICAL DATA:  69 year old male with altered mental status. EXAM: CHEST - 2 VIEW COMPARISON:  02/02/2020 and prior chest radiographs FINDINGS: Cardiomegaly and possible mild pulmonary vascular congestion again noted. There is no evidence of focal airspace disease, pulmonary edema, suspicious pulmonary nodule/mass, pleural effusion, or pneumothorax. No acute bony abnormalities are identified. IMPRESSION: Cardiomegaly with possible mild pulmonary vascular congestion. Electronically Signed   By: Margarette Canada M.D.   On: 02/25/2020 12:26   CT Head Wo  Contrast  Result Date: 02/25/2020 CLINICAL DATA:  69 year old male with altered mental status. EXAM: CT HEAD WITHOUT CONTRAST TECHNIQUE: Contiguous axial images were obtained from the base of the skull through the vertex without intravenous contrast. COMPARISON:  01/24/2020 and prior CTs FINDINGS: Brain: No evidence of acute infarction, hemorrhage, hydrocephalus, extra-axial collection or mass lesion/mass effect. Atrophy and mild chronic small-vessel white matter ischemic changes again noted. Vascular: Carotid atherosclerotic calcifications are noted. Skull: Normal. Negative for fracture or focal lesion. Sinuses/Orbits: No acute finding. Other: None. IMPRESSION: 1. No evidence of acute intracranial abnormality. 2. Atrophy and chronic small-vessel white matter ischemic changes. Electronically Signed   By: Margarette Canada M.D.   On: 02/25/2020 12:28    EKG: Independently reviewed.  NSR with rate 85; prolonged QTc 503; nonspecific ST changes with no evidence of acute ischemia   Labs on Admission: I have personally reviewed the available labs and imaging studies at the time of the admission.  Pertinent labs:   Glucose 127 BUN 48/Creatinine 2.94/GFR 24; stable AP 271 Albumin 3.1 NH4 112; 32 in 02/2019 WBC 2.7; stable Hgb 10.6; stable Platelets 26; stable INR 1.2 UA unremarkable ETOH <10   Assessment/Plan Principal Problem:   Hepatic encephalopathy (HCC) Active Problems:   Hypertension   Alcohol dependence ([place])   PAH (pulmonary arterial hypertension) with portal hypertension (HCC)   Stage 4 chronic kidney disease (HCC)   OSA (obstructive sleep apnea)   Pancytopenia (HCC)   Cirrhosis with alcoholism ([place])   Hyperlipidemia   Seizure disorder (HCC)   COPD (chronic obstructive pulmonary  disease) ([place])   Chronic diastolic CHF (congestive heart failure) (HCC)   Hepatic encephalopathy -Patient with known alcoholic cirrhosis which has been decompensated with ascites (none present  currently) -Presenting with acute onset of AMS this morning -Ammonia 112; 32 in 02/2019 -He does appear to be encephalopathic with waxing and waning mental status -Family reports no recent changes to any medications - he was actually getting ready to increase his home dose of Torsemide -No apparent s/sx of infection (see below - chronic respiratory failure) -He does not appear to have new electrolyte abnormalities, worsening renal failure, hypoglycemia, or other apparent triggers  -Regardless of the cause, he does have AMS and needs empiric treatment for encephalopathy with lactulose titrated to 2-3 soft stools/day -If this is the cause, would anticipate improvement in mental status in the next 12-24 hours -If not improving, he likely needs a more extensive evaluation for causes of his AMS -PT/OT consults - he receives therapy at home and is now weaker than prior according to his daughter  Cirrhosis -MELD/MELD-Na score is 23/24, with a mortality rate of 19.6% -He is not a candidate for liver transplant, as he stopped drinking only 2 months ago -Continue diuretics -Consider addition of BB and also Rifaximin  Pulmonary HTN/COPD -Continue home meds - Opsumit, Selexipag, Tadalafil -He appears to be compensated from this standpoint at this time -Of note, all 3 of these medications can cause liver dysfunction or need dose adjustment in the setting of liver failure; outpatient discussion with his pulmonologist for ongoing discussion will be important in trying to balance his advanced pulmonary needs vs. His advancing hepatic issues  Pancytopenia -Appears to be stable -Avoid heparin, NSAIDs -Continue Hemocute  Stage 4 CKD -Advanced CKD, followed by Dr. Marval Regal -Appears to be stable at this time  ETOH dependence -patient reportedly stopped drinking 2 months ago -Negative blood ETOH level -We stressed that he is allergic to alcohol and should never drink any again -Continue folate,  thiamine, MVI -He should not need CIWA given 2 months of sobriety  HLD -Continue Crestor  Chronic diastolic CHF -07/19/93 echo with presented EF, moderate LVH, and grade 2 diastolic dysfunction -Marked LE edema but his daughter reports that this is improved from prior -Renal function is stable despite recent diuresis -Patient reports that plan was to increase torsemide dose from 60 BID to 80 BID; however last cardiology note indicates plan for 60 mg PO BID -For now, will continue 60 mg PO BID and continue to follow  Seizures -Followed by Dr. Delice Lesch -Continue Keppra  OSA -Not on CPAP    Note: This patient has been tested and is negative for the novel coronavirus COVID-19.    DVT prophylaxis:  SCDs Code Status:  Full - confirmed with patient/family Family Communication: His daughter was present throughout the evaluation Disposition Plan:  The patient is from: home  Anticipated d/c is to: home with [facility] services (already has)  Anticipated d/c date will depend on clinical response to treatment, but possibly as early as tomorrow if he has excellent response to treatment  Patient is currently: acutely ill Consults called: PT/OT Admission status:  It is my clinical opinion that referral for OBSERVATION is reasonable and necessary in this patient based on the above information provided. The aforementioned taken together are felt to place the patient at high risk for further clinical deterioration. However it is anticipated that the patient may be medically stable for discharge from the hospital within 24 to 48 hours.    Anderson Malta  Lorin Mercy MD [facility]   How to contact the [facility] Attending or Consulting provider [place] or covering provider during after hours [place], for this patient?  1. Check the care team in [facility] and look for a) attending/consulting TR[facility] provider listed and b) the [facility] team listed 2. Log into [email or web address] and use [place]'s universal password to access. If you  do not have the password, please contact the hospital operator. 3. Locate the [facility] provider you are looking for under [facility] and page to a number that you can be directly reached. 4. If you still have difficulty reaching the provider, please page the [facility] (Director on Call) for the Hospitalists listed on amion for assistance.   02/25/2020, 3:46 PM

## 2020-02-25 NOTE — ED Notes (Signed)
PA made aware of platelet critical of 26

## 2020-02-25 NOTE — ED Triage Notes (Signed)
Pt here [facility] from home, pt woke up lethargic and altered, incontinent, inappropriate laughter, etc. Pt has no complaints. Yesterday patient walked one mile without assistance, yet today could not even get out of the chair.

## 2020-02-25 NOTE — ED Notes (Signed)
Urine sent down to lab WITH culture

## 2020-02-25 NOTE — ED Notes (Signed)
Patient transported to CT 

## 2020-02-25 NOTE — ED Provider Notes (Signed)
[facility] DEPARTMENT Provider Note   CSN: [id number] Arrival date & time: 02/25/20  1054     History No chief complaint on file.   Darren Mueller is a 69 y.o. male.  Level V caveat due to AMS.   Patient does not know why he is here.  He states he is having "a bad day."  He cannot say why it is a bad day.  Denies pain.  Additional history obtained from patient's daughter.  She states patient is confused and not acting like himself.  Normally he is able to walk and talk, is alert and oriented.  However today he did not know who the president was or what the year was.  He has not been able to walk due to weakness.  He is not acting like himself.  Patient recently has been receiving IM shots of Lasix, no other medication changes.  Patient denies current alcohol use.  Additional history taken chart review.  Patient with a history of liver cirrhosis secondary to alcohol use, cardiomegaly, CHF, CKD, COPD, CAD, GERD, hypertension, hyperlipidemia, PAH, pancytopenia.  HPI     Past Medical History:  Diagnosis Date  . Alcohol dependence in remission ([place]) 03/22/2011   In remission   . Alcoholic cirrhosis of liver with ascites ([place]) 2014  . Anginal pain ([place])    with SOB admitted 04/20/2019  . Cardiomegaly 02/2019   Noted on CXR  . CHF (congestive heart failure) ([place])   . CKD (chronic kidney disease), stage III 07/18/2013  . COPD (chronic obstructive pulmonary disease) ([place])   . Coronary artery disease   . GERD (gastroesophageal reflux disease)   . Gout   . Hearing loss   . Hypertension   . Hypertriglyceridemia 03/17/2017  . PAH (pulmonary artery hypertension) (HCC)    RV failure  . Pancytopenia ([place])   . Pleural effusion 2014   Noted on CT:   Moderately large simple layering right pleural effusion   . Pulmonary nodule 2014   Noted on CT: An 8 mm pulmonary nodule in the periphery of the left lower lobe  . Rectal bleeding   . Seizures ([place])   .  Sleep apnea    does not wear CPAP  . Splenomegaly 12/2015   Noted on CT  . Thoracic aorta atherosclerosis ([place]) 12/2017  . Thrombocytopenia ([place]) 06/29/2016  . Unspecified hypothyroidism 01/25/2013  . Vertigo     Patient Active Problem List   Diagnosis Date Noted  . Neck pain 02/10/2020  . Anorexia 02/10/2020  . AKI (acute kidney injury) ([place])   . Cellulitis of right lower extremity   . Acute exacerbation of CHF (congestive heart failure) ([place]) 01/30/2020  . CHF (congestive heart failure) ([place]) 01/20/2020  . Abdominal pain 08/23/2019  . Grief 08/23/2019  . Acute on chronic diastolic heart failure ([place]) 05/16/2019  . Acute on chronic diastolic (congestive) heart failure ([place]) 05/16/2019  . Calf pain 04/03/2019  . History of colonic polyps   . Polyp of cecum   . Headache 02/21/2019  . Rectal bleeding 02/13/2019  . COPD (chronic obstructive pulmonary disease) ([place]) 02/13/2019  . Acute on chronic right heart failure ([place]) 02/13/2019  . Hematochezia   . Ascites   . Dark stools   . Alcoholism ([place])   . Hyperglycemia 05/04/2018  . Seizure disorder ([place]) 02/01/2018  . Pedal edema 10/01/2017  . RUQ pain 05/06/2017  . Decreased range of motion of left elbow 04/15/2017  . Hyperlipidemia 03/17/2017  .  Elbow pain, left 03/17/2017  . Right rib fracture   . Syncope 07/19/2016  . Thrombocytopenia ([place]) 06/29/2016  . Hoarseness, chronic 06/29/2016  . Preventative health care 06/29/2016  . Chest pain   . Low back pain 12/09/2015  . Cirrhosis with alcoholism ([place]) 08/08/2015  . Pancytopenia ([place]) 06/10/2015  . Dermatitis 06/10/2015  . OSA (obstructive sleep apnea) 02/13/2015  . Gout 07/30/2014  . Diarrhea 09/04/2013  . Chronic renal insufficiency, stage III (moderate) ([place]) 07/18/2013  . Right heart failure due to pulmonary hypertension ([place]) 06/06/2013  . Hypothyroidism 01/25/2013  . PAH (pulmonary arterial hypertension) with portal hypertension ([place]) 01/21/2013  . Allergic  rhinitis 12/21/2012  . Secondary pulmonary hypertension 12/09/2012  . Hepatic cirrhosis ([place]) 11/26/2012  . GERD (gastroesophageal reflux disease) 09/05/2011  . Hypokalemia 08/29/2011  . Colon polyps 03/26/2011  . Insomnia 03/26/2011  . Hypertension 03/22/2011  . Alcohol dependence ([place]) 03/22/2011    Past Surgical History:  Procedure Laterality Date  . CARDIAC CATHETERIZATION N/A 12/21/2015   Procedure: Right Heart Cath;  Surgeon: Jolaine Artist, MD;  Location: [facility] CV LAB;  Service: Cardiovascular;  Laterality: N/A;  . CARDIAC CATHETERIZATION N/A 04/15/2016   Procedure: Right/Left Heart Cath and Coronary Angiography;  Surgeon: Jolaine Artist, MD;  Location: [facility] LAB;  Service: Cardiovascular;  Laterality: N/A;  . COLONOSCOPY N/A 12/08/2013   Procedure: COLONOSCOPY;  Surgeon: Milus Banister, MD;  Location: [facility];  Service: Endoscopy;  Laterality: N/A;  . COLONOSCOPY WITH PROPOFOL N/A 03/24/2019   Procedure: COLONOSCOPY WITH PROPOFOL;  Surgeon: Milus Banister, MD;  Location: [facility];  Service: Endoscopy;  Laterality: N/A;  Draw CBC in preop  . COLONOSCOPY WITH PROPOFOL N/A 04/28/2019   Procedure: COLONOSCOPY WITH PROPOFOL;  Surgeon: Milus Banister, MD;  Location: [facility];  Service: Endoscopy;  Laterality: N/A;  . ESOPHAGOGASTRODUODENOSCOPY (EGD) WITH PROPOFOL N/A 03/27/2016   Procedure: ESOPHAGOGASTRODUODENOSCOPY (EGD) WITH PROPOFOL;  Surgeon: Milus Banister, MD;  Location: [facility];  Service: Endoscopy;  Laterality: N/A;  . ESOPHAGOGASTRODUODENOSCOPY (EGD) WITH PROPOFOL N/A 02/14/2019   Procedure: ESOPHAGOGASTRODUODENOSCOPY (EGD) WITH PROPOFOL;  Surgeon: Jerene Bears, MD;  Location: [facility] ENDOSCOPY;  Service: Gastroenterology;  Laterality: N/A;  . EYE SURGERY Bilateral 03/15/2019   lasix  . GAS/FLUID EXCHANGE Right 09/22/2019   Procedure: GAS/FLUID EXCHANGE (C3F8) RIGHT EYE;  Surgeon: Sherlynn Stalls, MD;  Location: [facility];  Service:  Ophthalmology;  Laterality: Right;  . HEMOSTASIS CLIP PLACEMENT  04/28/2019   Procedure: HEMOSTASIS CLIP PLACEMENT;  Surgeon: Milus Banister, MD;  Location: [facility];  Service: Endoscopy;;  . LOOP RECORDER INSERTION N/A 01/01/2017   Procedure: Loop Recorder Insertion;  Surgeon: Thompson Grayer, MD;  Location: [facility] CV LAB;  Service: Cardiovascular;  Laterality: N/A;  . PARS PLANA VITRECTOMY Right 09/22/2019   Procedure: PARS PLANA VITRECTOMY WITH 25 GAUGE RIGHT EYE;  Surgeon: Sherlynn Stalls, MD;  Location: [facility];  Service: Ophthalmology;  Laterality: Right;  . PHOTOCOAGULATION WITH LASER Right 09/22/2019   Procedure: PHOTOCOAGULATION WITH LASER RIGHT EYE;  Surgeon: Sherlynn Stalls, MD;  Location: [facility];  Service: Ophthalmology;  Laterality: Right;  . POLYPECTOMY  04/28/2019   Procedure: POLYPECTOMY;  Surgeon: Milus Banister, MD;  Location: [facility];  Service: Endoscopy;;  . RIGHT HEART CATHETERIZATION Right 06/06/2014   Procedure: RIGHT HEART CATH;  Surgeon: Jolaine Artist, MD;  Location[facility] LAB;  Service: Cardiovascular;  Laterality: Right;  . UVULOPALATOPHARYNGOPLASTY  1999  Family History  Problem Relation Age of Onset  . Heart disease Mother   . Heart attack Mother   . Hypertension Mother   . Kidney failure Mother   . Liver disease Mother        stage 4 liver disease  . Prostate cancer Father   . Cancer Father        lung  . Hypertension Brother   . Gout Brother   . Alcoholism Maternal Grandfather   . Liver disease Maternal Grandfather   . Migraines Sister   . Hypertension Brother   . Colon cancer Neg Hx     Social History   Tobacco Use  . Smoking status: Former Smoker    Packs/day: 2.00    Years: 5.00    Pack years: 10.00    Types: Cigarettes    Quit date: 09/22/1979    Years since quitting: 40.4  . Smokeless tobacco: Never Used  Substance Use Topics  . Alcohol use: Yes    Alcohol/week: 6.0 standard drinks    Types: 6 Shots of liquor per  week    Comment: social drinker  . Drug use: No    Home Medications Prior to Admission medications   Medication Sig Start Date End Date Taking? Authorizing Provider  acetaminophen (TYLENOL) 500 MG tablet Take 1,000 mg by mouth every 6 (six) hours as needed (for pain.).    [provider]  allopurinol (ZYLOPRIM) 100 MG tablet Take 2 tablets (200 mg total) by mouth daily. 07/12/19   Mosie Lukes, MD  Blood Pressure Monitoring (BLOOD PRESSURE CUFF) MISC Use as directed. Check blood pressure bid prn Dx:I10 02/22/19   Mosie Lukes, MD  diphenoxylate-atropine (LOMOTIL) 2.5-0.025 MG tablet Take 1 tablet by mouth 4 (four) times daily as needed for diarrhea or loose stools. 12/01/19   Mosie Lukes, MD  Ferrous Fumarate (HEMOCYTE) 324 (106 Fe) MG TABS tablet Take 1 tablet (106 mg of iron total) by mouth daily. 05/05/19   Mosie Lukes, MD  folic acid (FOLVITE) 1 MG tablet Take 1 tablet (1 mg total) by mouth daily. 01/11/20   Saguier, Percell Miller, PA-C  hyoscyamine (LEVSIN SL) 0.125 MG SL tablet Place 1 tablet (0.125 mg total) under the tongue every 6 (six) hours as needed. Patient taking differently: Place 0.125 mg under the tongue every 6 (six) hours as needed for cramping.  08/23/19   Mosie Lukes, MD  macitentan (OPSUMIT) 10 MG tablet Take 1 tablet (10 mg total) by mouth daily. 09/26/19   Bensimhon, Shaune Pascal, MD  Multiple Vitamin (MULTIVITAMIN) tablet Take 1 tablet by mouth daily.    [provider]  omeprazole (PRILOSEC) 40 MG capsule TAKE 1 CAPSULE BY MOUTH ONCE DAILY Patient taking differently: Take 40 mg by mouth daily.  11/14/19   Saguier, Percell Miller, PA-C  Potassium Chloride ER 20 MEQ TBCR Take 40 mEq by mouth 2 (two) times daily. 02/05/20   Domenic Polite, MD  rosuvastatin (CRESTOR) 5 MG tablet TAKE 1 TABLET DAILY Patient taking differently: Take 5 mg by mouth daily at 6 PM.  08/31/19   Mosie Lukes, MD  Selexipag (UPTRAVI) 1000 MCG TABS Take 1,000 mcg by mouth 2 (two) times  daily. 06/03/19   Bensimhon, Shaune Pascal, MD  SUMAtriptan (IMITREX) 50 MG tablet Take 1 tablet (50 mg total) by mouth daily as needed for migraine or headache. 02/09/20 03/10/20  Clegg, Amy D, NP  tadalafil, PAH, (ADCIRCA) 20 MG tablet Take 2 tablets (40 mg total)  by mouth daily. 11/18/19   Bensimhon, Shaune Pascal, MD  thiamine 100 MG tablet Take 1 tablet (100 mg total) by mouth daily. 05/05/19   Mosie Lukes, MD  torsemide (DEMADEX) 20 MG tablet Take 3 tablets (60 mg total) by mouth 2 (two) times daily. 01/28/20   Mercy Riding, MD    Allergies    Aspirin and Nitroglycerin  Review of Systems   Review of Systems  Unable to perform ROS: Mental status change  Neurological: Positive for weakness.  Psychiatric/Behavioral: Positive for confusion.    Physical Exam Updated Vital Signs BP 114/61   Pulse 77   Temp 97.9 F (36.6 C) (Oral)   Resp (!) 26   SpO2 100%   Physical Exam Vitals and nursing note reviewed.  Constitutional:      Appearance: He is well-developed. He is ill-appearing and toxic-appearing.     Comments: Appears confused, lethargic  HENT:     Head: Normocephalic and atraumatic.  Eyes:     Extraocular Movements: Extraocular movements intact.     Conjunctiva/sclera: Conjunctivae normal.     Pupils: Pupils are equal, round, and reactive to light.  Cardiovascular:     Rate and Rhythm: Normal rate and regular rhythm.     Pulses: Normal pulses.  Pulmonary:     Effort: Pulmonary effort is normal. No respiratory distress.     Breath sounds: Rales present. No wheezing.     Comments: Rales in bilateral lower lobes. SpO2 stable on RA Abdominal:     General: There is no distension.     Palpations: Abdomen is soft. There is no mass.     Tenderness: There is no abdominal tenderness. There is no guarding or rebound.  Musculoskeletal:        General: Normal range of motion.     Cervical back: Normal range of motion and neck supple.     Right lower leg: No edema.     Left lower leg:  Edema present.     Comments: 2+ pitting edema bilaterally  Skin:    General: Skin is warm and dry.     Capillary Refill: Capillary refill takes less than 2 seconds.  Neurological:     Mental Status: He is oriented to person, place, and time.     ED Results / Procedures / Treatments   Labs (all labs ordered are listed, but only abnormal results are displayed) Labs Reviewed  CBC WITH DIFFERENTIAL/PLATELET - Abnormal; Notable for the following components:      Result Value   WBC 2.7 (*)    RBC 3.23 (*)    Hemoglobin 10.6 (*)    HCT 34.3 (*)    MCV 106.2 (*)    RDW 18.7 (*)    Platelets 26 (*)    Lymphs Abs 0.6 (*)    All other components within normal limits  COMPREHENSIVE METABOLIC PANEL - Abnormal; Notable for the following components:   CO2 21 (*)    Glucose, Bld 127 (*)    BUN 48 (*)    Creatinine, Ser 2.94 (*)    Albumin 3.1 (*)    AST 83 (*)    Alkaline Phosphatase 271 (*)    Total Bilirubin 2.7 (*)    GFR calc non Af Amer 21 (*)    GFR calc Af Amer 24 (*)    All other components within normal limits  URINALYSIS, ROUTINE W REFLEX MICROSCOPIC - Abnormal; Notable for the following components:   Color, Urine AMBER (*)  All other components within normal limits  AMMONIA - Abnormal; Notable for the following components:   Ammonia 112 (*)    All other components within normal limits  PROTIME-INR - Abnormal; Notable for the following components:   Prothrombin Time 15.4 (*)    All other components within normal limits  CBG MONITORING, ED - Abnormal; Notable for the following components:   Glucose-Capillary 107 (*)    All other components within normal limits  RESPIRATORY PANEL BY RT PCR (FLU A&B, COVID)  ETHANOL  BRAIN NATRIURETIC PEPTIDE    EKG EKG Interpretation  Date/Time:  Saturday February 25 2020 11:04:47 EDT Ventricular Rate:  85 PR Interval:    QRS Duration: 100 QT Interval:  423 QTC Calculation: 503 R Axis:   113 Text Interpretation: Sinus rhythm  Prolonged PR interval Left atrial enlargement Posterior infarct, acute (LCx) Lateral leads are also involved Prolonged QT interval ST depression V1-V3, suggest recording posterior leads morphology similar to previous, no acute changes Confirmed by Theotis Burrow [phone]) on 02/25/2020 11:37:54 AM   Radiology DG Chest 2 View  Result Date: 02/25/2020 CLINICAL DATA:  69 year old male with altered mental status. EXAM: CHEST - 2 VIEW COMPARISON:  02/02/2020 and prior chest radiographs FINDINGS: Cardiomegaly and possible mild pulmonary vascular congestion again noted. There is no evidence of focal airspace disease, pulmonary edema, suspicious pulmonary nodule/mass, pleural effusion, or pneumothorax. No acute bony abnormalities are identified. IMPRESSION: Cardiomegaly with possible mild pulmonary vascular congestion. Electronically Signed   By: Margarette Canada M.D.   On: 02/25/2020 12:26   CT Head Wo Contrast  Result Date: 02/25/2020 CLINICAL DATA:  69 year old male with altered mental status. EXAM: CT HEAD WITHOUT CONTRAST TECHNIQUE: Contiguous axial images were obtained from the base of the skull through the vertex without intravenous contrast. COMPARISON:  01/24/2020 and prior CTs FINDINGS: Brain: No evidence of acute infarction, hemorrhage, hydrocephalus, extra-axial collection or mass lesion/mass effect. Atrophy and mild chronic small-vessel white matter ischemic changes again noted. Vascular: Carotid atherosclerotic calcifications are noted. Skull: Normal. Negative for fracture or focal lesion. Sinuses/Orbits: No acute finding. Other: None. IMPRESSION: 1. No evidence of acute intracranial abnormality. 2. Atrophy and chronic small-vessel white matter ischemic changes. Electronically Signed   By: Margarette Canada M.D.   On: 02/25/2020 12:28    Procedures .Critical Care Performed by: Franchot Heidelberg, PA-C Authorized by: Franchot Heidelberg, PA-C   Critical care provider statement:    Critical care time  (minutes):  45   Critical care time was exclusive of:  Separately billable procedures and treating other patients and teaching time   Critical care was necessary to treat or prevent imminent or life-threatening deterioration of the following conditions:  Hepatic failure   Critical care was time spent personally by me on the following activities:  Blood draw for specimens, development of treatment plan with patient or surrogate, evaluation of patient's response to treatment, examination of patient, obtaining history from patient or surrogate, ordering and performing treatments and interventions, ordering and review of laboratory studies, ordering and review of radiographic studies, pulse oximetry, re-evaluation of patient's condition and review of old charts   I assumed direction of critical care for this patient from another provider in my specialty: no   Comments:     Pt presenting altered. W/u c/w hepatic encephalopathy. He will need admission   (including critical care time)  Medications Ordered in ED Medications  lactulose (CHRONULAC) 10 GM/15ML solution 30 g (30 g Oral Given 02/25/20 1339)    ED Course  I have reviewed the triage vital signs and the nursing notes.  Pertinent labs & imaging results that were available during my care of the patient were reviewed by me and considered in my medical decision making (see chart for details).    MDM Rules/Calculators/A&P                      Patient presenting for evaluation of weakness and confusion.  On exam, patient appears confused and lethargic.  He is dehydrated.  Slight yellowish tint to skin, but no overt jaundice.  Concern for uremia versus hepatic encephalopathy.  Also consider infection such as UTI.  Consider hypoxia due to CHF, although sats are stable on room air.  Consider CVA, although less likely without focal deficit.  Will obtain labs, chest x-ray, CT head and reassess.  Labs consistent with hepatic encephalopathy, ammonia  elevated at 112.  Patient with chronic pancytopenia, at his baseline.  Urine without infection.  CT head negative.  Chest x-ray viewed interpreted by me, shows some fluid, however patient remains stable on room air and without respiratory distress.  KG unchanged from previous.  Will give lactulose and admit to the hospitalist.  Discussed with Dr. Lorin Mercy from [facility] hospitalist service, pt to be admitted.   Final Clinical Impression(s) / ED Diagnoses Final diagnoses:  Hepatic encephalopathy ([place])  Pancytopenia [facility])    Rx / DC Orders ED Discharge Orders    None       Franchot Heidelberg, PA-C 02/25/20 1347    Little, Wenda Overland, MD 02/29/20 1344

## 2020-02-25 NOTE — ED Notes (Signed)
Pharmacy messaged about lactulose

## 2020-02-25 NOTE — ED Notes (Addendum)
Patient transported to XR. 

## 2020-02-25 NOTE — Progress Notes (Signed)
Completed pt admission database with pt son at bedside  Pt vital signs documented  Pt has all belongings in reach, including call light  Will continue to monitor

## 2020-02-26 DIAGNOSIS — Z20822 Contact with and (suspected) exposure to covid-19: Secondary | ICD-10-CM | POA: Diagnosis present

## 2020-02-26 DIAGNOSIS — I130 Hypertensive heart and chronic kidney disease with heart failure and stage 1 through stage 4 chronic kidney disease, or unspecified chronic kidney disease: Secondary | ICD-10-CM | POA: Diagnosis present

## 2020-02-26 DIAGNOSIS — K7460 Unspecified cirrhosis of liver: Secondary | ICD-10-CM | POA: Diagnosis not present

## 2020-02-26 DIAGNOSIS — E039 Hypothyroidism, unspecified: Secondary | ICD-10-CM | POA: Diagnosis present

## 2020-02-26 DIAGNOSIS — K7290 Hepatic failure, unspecified without coma: Principal | ICD-10-CM

## 2020-02-26 DIAGNOSIS — Z87891 Personal history of nicotine dependence: Secondary | ICD-10-CM | POA: Diagnosis not present

## 2020-02-26 DIAGNOSIS — R188 Other ascites: Secondary | ICD-10-CM | POA: Diagnosis not present

## 2020-02-26 DIAGNOSIS — E785 Hyperlipidemia, unspecified: Secondary | ICD-10-CM | POA: Diagnosis present

## 2020-02-26 DIAGNOSIS — I5032 Chronic diastolic (congestive) heart failure: Secondary | ICD-10-CM

## 2020-02-26 DIAGNOSIS — I5033 Acute on chronic diastolic (congestive) heart failure: Secondary | ICD-10-CM | POA: Diagnosis present

## 2020-02-26 DIAGNOSIS — K7031 Alcoholic cirrhosis of liver with ascites: Secondary | ICD-10-CM | POA: Diagnosis present

## 2020-02-26 DIAGNOSIS — G40909 Epilepsy, unspecified, not intractable, without status epilepticus: Secondary | ICD-10-CM | POA: Diagnosis present

## 2020-02-26 DIAGNOSIS — Z66 Do not resuscitate: Secondary | ICD-10-CM

## 2020-02-26 DIAGNOSIS — F1021 Alcohol dependence, in remission: Secondary | ICD-10-CM | POA: Diagnosis present

## 2020-02-26 DIAGNOSIS — R630 Anorexia: Secondary | ICD-10-CM | POA: Diagnosis present

## 2020-02-26 DIAGNOSIS — I2721 Secondary pulmonary arterial hypertension: Secondary | ICD-10-CM

## 2020-02-26 DIAGNOSIS — K7030 Alcoholic cirrhosis of liver without ascites: Secondary | ICD-10-CM

## 2020-02-26 DIAGNOSIS — Z7189 Other specified counseling: Secondary | ICD-10-CM

## 2020-02-26 DIAGNOSIS — I2510 Atherosclerotic heart disease of native coronary artery without angina pectoris: Secondary | ICD-10-CM | POA: Diagnosis present

## 2020-02-26 DIAGNOSIS — Z515 Encounter for palliative care: Secondary | ICD-10-CM | POA: Diagnosis not present

## 2020-02-26 DIAGNOSIS — D61818 Other pancytopenia: Secondary | ICD-10-CM | POA: Diagnosis present

## 2020-02-26 DIAGNOSIS — G4733 Obstructive sleep apnea (adult) (pediatric): Secondary | ICD-10-CM | POA: Diagnosis not present

## 2020-02-26 DIAGNOSIS — N184 Chronic kidney disease, stage 4 (severe): Secondary | ICD-10-CM | POA: Diagnosis not present

## 2020-02-26 DIAGNOSIS — M109 Gout, unspecified: Secondary | ICD-10-CM | POA: Diagnosis present

## 2020-02-26 DIAGNOSIS — I5082 Biventricular heart failure: Secondary | ICD-10-CM | POA: Diagnosis present

## 2020-02-26 DIAGNOSIS — J449 Chronic obstructive pulmonary disease, unspecified: Secondary | ICD-10-CM | POA: Diagnosis present

## 2020-02-26 DIAGNOSIS — R4182 Altered mental status, unspecified: Secondary | ICD-10-CM | POA: Diagnosis not present

## 2020-02-26 DIAGNOSIS — K766 Portal hypertension: Secondary | ICD-10-CM

## 2020-02-26 DIAGNOSIS — K219 Gastro-esophageal reflux disease without esophagitis: Secondary | ICD-10-CM | POA: Diagnosis present

## 2020-02-26 DIAGNOSIS — R519 Headache, unspecified: Secondary | ICD-10-CM | POA: Diagnosis present

## 2020-02-26 DIAGNOSIS — R040 Epistaxis: Secondary | ICD-10-CM | POA: Diagnosis not present

## 2020-02-26 LAB — CBC
HCT: 32.5 % — ABNORMAL LOW (ref 39.0–52.0)
Hemoglobin: 10.5 g/dL — ABNORMAL LOW (ref 13.0–17.0)
MCH: 32.7 pg (ref 26.0–34.0)
MCHC: 32.3 g/dL (ref 30.0–36.0)
MCV: 101.2 fL — ABNORMAL HIGH (ref 80.0–100.0)
Platelets: 27 10*3/uL — CL (ref 150–400)
RBC: 3.21 MIL/uL — ABNORMAL LOW (ref 4.22–5.81)
RDW: 18.7 % — ABNORMAL HIGH (ref 11.5–15.5)
WBC: 3 10*3/uL — ABNORMAL LOW (ref 4.0–10.5)
nRBC: 0 % (ref 0.0–0.2)

## 2020-02-26 LAB — COMPREHENSIVE METABOLIC PANEL
ALT: 39 U/L (ref 0–44)
AST: 92 U/L — ABNORMAL HIGH (ref 15–41)
Albumin: 3 g/dL — ABNORMAL LOW (ref 3.5–5.0)
Alkaline Phosphatase: 264 U/L — ABNORMAL HIGH (ref 38–126)
Anion gap: 12 (ref 5–15)
BUN: 46 mg/dL — ABNORMAL HIGH (ref 8–23)
CO2: 21 mmol/L — ABNORMAL LOW (ref 22–32)
Calcium: 9.3 mg/dL (ref 8.9–10.3)
Chloride: 105 mmol/L (ref 98–111)
Creatinine, Ser: 2.5 mg/dL — ABNORMAL HIGH (ref 0.61–1.24)
GFR calc Af Amer: 29 mL/min — ABNORMAL LOW (ref >60)
GFR calc non Af Amer: 25 mL/min — ABNORMAL LOW (ref >60)
Glucose, Bld: 111 mg/dL — ABNORMAL HIGH (ref 70–99)
Potassium: 3.6 mmol/L (ref 3.5–5.1)
Sodium: 138 mmol/L (ref 135–145)
Total Bilirubin: 2.6 mg/dL — ABNORMAL HIGH (ref 0.3–1.2)
Total Protein: 6.7 g/dL (ref 6.5–8.1)

## 2020-02-26 LAB — PROTIME-INR
INR: 1.2 (ref 0.8–1.2)
Prothrombin Time: 15 seconds (ref 11.4–15.2)

## 2020-02-26 LAB — TYPE AND SCREEN
ABO/RH(D): O POS
Antibody Screen: NEGATIVE

## 2020-02-26 LAB — AMMONIA: Ammonia: 80 umol/L — ABNORMAL HIGH (ref 9–35)

## 2020-02-26 MED ORDER — LACTULOSE 10 GM/15ML PO SOLN
30.0000 g | Freq: Two times a day (BID) | ORAL | Status: DC
  Administered 2020-02-26: 30 g via ORAL

## 2020-02-26 MED ORDER — OXYMETAZOLINE HCL 0.05 % NA SOLN
3.0000 | Freq: Two times a day (BID) | NASAL | Status: DC | PRN
  Filled 2020-02-26: qty 30

## 2020-02-26 MED ORDER — SODIUM CHLORIDE 0.9% IV SOLUTION: Freq: Once | INTRAVENOUS | Status: DC

## 2020-02-26 MED ORDER — ONDANSETRON HCL 4 MG/2ML IJ SOLN
4.0000 mg | Freq: Three times a day (TID) | INTRAMUSCULAR | Status: DC | PRN
  Administered 2020-02-26: 4 mg via INTRAVENOUS
  Filled 2020-02-26: qty 2

## 2020-02-26 NOTE — Evaluation (Signed)
Occupational Therapy Evaluation Patient Details Name: Darren Mueller MRN: [id number] DOB: [birth year] Today's Date: 02/26/2020    History of Present Illness Darren Mueller is a 69 y.o. male s/p presenting with AMS. and weakness found to have hepatic encephalopathy and involves alcoholic cirrhosis. CT head negative. PMHx: hypothyroidism; OSA not on CPAP; seizures; HLD; HTN; CAD; COPD; stage 3 CKD; alcoholic cirrhosis with ascites; h/o ETOH dependence; and chronic diastolic CHF.   Clinical Impression   Pt PTA: living at home with son and reports independence with ADL and mobility with no AD, active with HH therapies. Pt currently requiring increased time for tasks, displaying weakness and slow, shuffled gait and decreased cognitive status. Pt standing at sink for short grooming task; pt picking item from floor with fair balance. Pt modA overall for ADL at this time. Pt with shuffled gait and unable to improve stride today. Pt would greatly benefit from continued OT skilled services for ADL, mobility and safety in CIR setting. Pt and family motivated to return to PLOF. OT following acutely.      Follow Up Recommendations  CIR    Equipment Recommendations  3 in 1 bedside commode;Other (comment)(defer to next facility)    Recommendations for Other Services Rehab consult     Precautions / Restrictions Precautions Precautions: Fall Precaution Comments: high fall Restrictions Weight Bearing Restrictions: No      Mobility Bed Mobility Overal bed mobility: Needs Assistance Bed Mobility: Sidelying to Sit   Sidelying to sit: Supervision       General bed mobility comments: no physical assist  Transfers Overall transfer level: Needs assistance Equipment used: Rolling walker (2 wheeled) Transfers: Sit to/from [facility] Sit to Stand: Min guard Stand pivot transfers: Min guard       General transfer comment: use of RW and cues for proper hand placement     Balance Overall balance assessment: Needs assistance Sitting-balance support: Feet supported Sitting balance-Leahy Scale: Fair     Standing balance support: Single extremity supported;During functional activity Standing balance-Leahy Scale: Fair                             ADL either performed or assessed with clinical judgement   ADL Overall ADL's : Needs assistance/impaired Eating/Feeding: Set up;Sitting   Grooming: Min guard;Standing   Upper Body Bathing: Min guard;Standing   Lower Body Bathing: Min guard;Sitting/lateral leans;Sit to/from stand;Cueing for safety   Upper Body Dressing : Min guard;Sitting;Moderate assistance;Standing   Lower Body Dressing: Min guard;Sitting/lateral leans;Moderate assistance;Sit to/from stand   Toilet Transfer: Min guard;RW   Toileting- Water quality scientist and Hygiene: Moderate assistance;Cueing for safety;Sitting/lateral lean;Sit to/from stand       Functional mobility during ADLs: Min guard;Rolling walker;Cueing for safety General ADL Comments: Pt requiring increased time for tasks, displaying weakness and slow, shuffled gait and decreased cognitive status. Pt stood at sink for light grooming task x1 min. Pt picking item from floor in hallway with single UE supported. Pt seated for LB ADL tasks with figure 4 technique to donn socks.      Vision Baseline Vision/History: No visual deficits Patient Visual Report: No change from baseline Vision Assessment?: No apparent visual deficits     Perception Perception Perception Tested?: Yes Spatial deficits: sensation intact   Praxis      Pertinent Vitals/Pain Pain Assessment: Faces Faces Pain Scale: Hurts a little bit Pain Location: nose Pain Descriptors / Indicators: Discomfort     Hand  Dominance Right   Extremity/Trunk Assessment Upper Extremity Assessment Upper Extremity Assessment: Generalized weakness   Lower Extremity Assessment Lower Extremity Assessment:  Generalized weakness RLE Deficits / Details: edema noted LLE Deficits / Details: edema noted   Cervical / Trunk Assessment Cervical / Trunk Assessment: Normal   Communication Communication Communication: No difficulties   Cognition Arousal/Alertness: Awake/alert Behavior During Therapy: WFL for tasks assessed/performed Overall Cognitive Status: Impaired/Different from baseline Area of Impairment: Orientation;Safety/judgement;Awareness;Problem solving                 Orientation Level: Disoriented to;Time;Situation   Memory: Decreased short-term memory Following Commands: Follows one step commands consistently;Follows multi-step commands with increased time;Follows multi-step commands inconsistently Safety/Judgement: Decreased awareness of safety;Decreased awareness of deficits Awareness: Intellectual Problem Solving: Slow processing;Decreased initiation;Requires verbal cues General Comments: Pt following 1 and 2 step commands; pt able to recall 3 items immediately, after 2 mins and 78mnutes, but pt onyl able to recall 1/3 items after 10 mins. Pt requiring cues to remember the month and year x2 attempts, 3rd attempt, pt able to recall month   General Comments  Pt's sister in room and worried that pt is not at his baseline cognition and mobility and talked with other sister asking for CIR.     Exercises     Shoulder Instructions      Home Living Family/patient expects to be discharged to:: Private residence Living Arrangements: Children Available Help at Discharge: Family;Available 24 hours/day Type of Home: House Home Access: Stairs to enter [employer] Steps: 2 Entrance Stairs-Rails: Can reach both Home Layout: One level     Bathroom Shower/Tub: TTeacher, early years/pre Standard     Home Equipment: None          Prior Functioning/Environment Level of Independence: Independent;Needs assistance  Gait / Transfers Assistance Needed: No  AD for mobility; did have HHOT/PT coming in ADL's / Homemaking Assistance Needed: Pt performing ADL and light IADL tasks. Pt's family members providing medication management in pill box. Pt does not require reminders to take meds.   Comments: patient drives, family         OT Problem List: Decreased strength;Decreased activity tolerance;Decreased cognition;Impaired balance (sitting and/or standing);Decreased safety awareness;Pain;Increased edema;Decreased knowledge of use of DME or AE      OT Treatment/Interventions: Self-care/ADL training;Therapeutic exercise;Energy conservation;DME and/or AE instruction;Therapeutic activities;Cognitive remediation/compensation;Visual/perceptual remediation/compensation;Patient/family education;Balance training    OT Goals(Current goals can be found in the care plan section) Acute Rehab OT Goals Patient Stated Goal: to go to rehab to become independent again OT Goal Formulation: With patient/family Time For Goal Achievement: 03/11/20 Potential to Achieve Goals: Good ADL Goals Pt Will Perform Grooming: with supervision;standing Pt Will Perform Lower Body Dressing: with supervision;sitting/lateral leans;sit to/from stand Pt Will Transfer to Toilet: with supervision;ambulating;bedside commode Pt/caregiver will Perform Home Exercise Program: Increased strength;Both right and left upper extremity;With Supervision Additional ADL Goal #1: Pt will consistently recall 3/3 items after different intervals of time to continue to assess memory strategies. Additional ADL Goal #2: Pt will consistently follow multi-step commands with minimal cues to attend to task.  OT Frequency: Min 2X/week   Barriers to D/C:            Co-evaluation              AM-PAC OT "6 Clicks" Daily Activity     Outcome Measure Help from another person eating meals?: A Little Help from another person taking care of personal grooming?: A Little Help  from another person toileting,  which includes using toliet, bedpan, or urinal?: A Lot Help from another person bathing (including washing, rinsing, drying)?: A Lot Help from another person to put on and taking off regular upper body clothing?: A Little Help from another person to put on and taking off regular lower body clothing?: A Lot 6 Click Score: 15   End of Session Equipment Utilized During Treatment: Gait belt;Rolling walker Nurse Communication: Mobility status  Activity Tolerance: Patient limited by fatigue Patient left: in bed;with call bell/phone within reach  OT Visit Diagnosis: Unsteadiness on feet (R26.81);Muscle weakness (generalized) (M62.81);Pain Pain - part of body: (nose)                Time: 8338-2505 OT Time Calculation (min): 31 min Charges:  OT General Charges $OT Visit: 1 Visit OT Evaluation $OT Eval Moderate Complexity: 1 Mod OT Treatments $Self Care/Home Management : 8-22 mins  Jefferey Pica, OTR/L Acute Rehabilitation Services Pager: [phone] Office: [phone]   Jefferey Pica 02/26/2020, 4:07 PM

## 2020-02-26 NOTE — Consult Note (Signed)
Consultation Note Date: 02/26/2020   Patient Name: Darren Mueller  DOB: 1951/01/20  MRN: [id number]  Age / Sex: 69 y.o., male  PCP: Darren Lukes, MD Referring Physician: Geradine Girt, DO  Reason for Consultation: Establishing goals of care  HPI/Patient Profile: 69 y.o. male  with past medical history of COPD, grade 2 diastolic dysfunction with severe pulmonary arterial HTN who was admitted on 02/25/2020 with hepatic encephalopathy.  Ammonia level 112, creatinine 3.0, and platelets in the 20,000s.  Overnight on the 24th he had significant epistaxis and required a unit of platelets.  His mental status has improved with treatment.  Of note this is he 3rd hospitalization on short succession.  Clinical Assessment and Goals of Care:  I have reviewed medical records including EPIC notes, labs and imaging, received report from Dr. Eliseo Mueller, examined the patient and met at bedside with first his sister Darren Mueller and later in the day with 2 of his 3 children to discuss diagnosis prognosis, GOC, EOL wishes, disposition and options.  I introduced Palliative Medicine as specialized medical care for people living with serious illness. It focuses on providing relief from the symptoms and stress of a serious illness.   We discussed a brief life review of the patient. The Col. Served in [employer] for 25 years.  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  He tells me about being quite active with the [employer] being on several boards and committees.  He has a large supportive family with many close knit siblings who support each other.  He has been married 3x but is currently not married and says "never again".  Col. Lives in his own home and his son lives with him.  He son is a Electronics engineer.  As far as functional and nutritional status at home he was independent with ADLs, but Darren Mueller tells me he fatigued easily.   This bout with encephalopathy was his first and came on quickly.  The family states he was normal until two days prior to admission.    We discussed his current illness and what it means in the larger context of his on-going co-morbidities.  Natural disease trajectory and expectations at EOL were discussed.  With the Col I explained at a high level the combination of heart, liver, lung, and kidney disease and the anticipated complications.  We talked about volume overload and recurrent encephalopathy.  We talked about his care being somewhat of a "balancing act" - medications for PAH may be starting to worsen his liver disease.  I attempted to elicit values and goals of care important to the patient.  We discussed code status.  The Col is a DNR.  I questioned him about his values and what he would want when he nears EOL.  I asked him to give it consideration and talk further with his daughter about when he would want to keep returning for more medical intervention vs when he would want to stay at home and be comfortable with  Hospice.     With his son and daughter - we talked In detail about lung (PAH, COPD, OSA), heart (CAD, Diastolic HF), liver (complications of encephalopathy, bleeding, volume overload), and kidney (stage 4, not a good candidate for HD).  We also talked about his current pancytopenia, as well as options to improve his current state - regular lactulose, avoid salt, eating a healthy diet with lean protein.  Darren Mueller (daughter and HCPOA) questioned me about Hospice.  I explained services with Palliative and services with Hospice.  Unfortunately some of the medications the Col is currently taking would not be paid for under Hospice (Tadalafil, Opsumit).    Darren Mueller felt that her father would benefit from understanding more about his combination of health conditions.  She asked if we could talk to him when his encephalopathy has lifted completely.  Questions and concerns were addressed.   The family was encouraged to call with questions or concerns.    Primary Decision Maker:  PATIENT, Darren Mueller is daughter Darren Mueller.    SUMMARY OF RECOMMENDATIONS    Code Status/Advance Care Planning:  DNR - was DNR prior to admission   Symptom Management:   Discussed titration of lactulose  Additional Recommendations (Limitations, Scope, Preferences):  Full Scope Treatment   Continued education about hepatic encephalopathy and liver disease.   Psycho-social/Spiritual:   Desire for further Chaplaincy support: Welcomed.  Prognosis:  Less than 6 months would not be surprising given his severe PAH, Cirrhosis with encephalopathy, pancytopenia, advancing HFpEF, and stage 4 CKD   Discharge Planning: Home with Home Health and Palliative      Primary Diagnoses: Present on Admission: . Hepatic encephalopathy ([place]) . Alcohol dependence ([place]) . COPD (chronic obstructive pulmonary disease) ([place]) . Cirrhosis with alcoholism ([place]) . Hyperlipidemia . Hypertension . PAH (pulmonary arterial hypertension) with portal hypertension ([place]) . OSA (obstructive sleep apnea) . Stage 4 chronic kidney disease ([place]) . Pancytopenia ([place]) . Chronic diastolic CHF (congestive heart failure) ([place])   I have reviewed the medical record, interviewed the patient and family, and examined the patient. The following aspects are pertinent.  Past Medical History:  Diagnosis Date  . Alcohol dependence in remission ([place]) 03/22/2011   In remission   . Alcoholic cirrhosis of liver with ascites ([place]) 2014  . Anginal pain ([place])    with SOB admitted 04/20/2019  . Cardiomegaly 02/2019   Noted on CXR  . CHF (congestive heart failure) ([place])   . CKD (chronic kidney disease), stage III 07/18/2013  . COPD (chronic obstructive pulmonary disease) ([place])   . Coronary artery disease   . GERD (gastroesophageal reflux disease)   . Gout   . Hearing loss   . Hypertension   . Hypertriglyceridemia 03/17/2017  . PAH  (pulmonary artery hypertension) ([facility])    RV failure  . Pancytopenia ([place])   . Pleural effusion 2014   Noted on CT:   Moderately large simple layering right pleural effusion   . Pulmonary nodule 2014   Noted on CT: An 8 mm pulmonary nodule in the periphery of the left lower lobe  . Rectal bleeding   . Seizures ([place])   . Sleep apnea    does not wear CPAP  . Splenomegaly 12/2015   Noted on CT  . Thoracic aorta atherosclerosis ([place]) 12/2017  . Thrombocytopenia ([place]) 06/29/2016  . Unspecified hypothyroidism 01/25/2013  . Vertigo    Social History   Socioeconomic History  . Marital status: Divorced    Spouse name: Not on file  .  Number of children: 3  . Years of education: Not on file  . Highest education level: Not on file  Occupational History  . Occupation: Retired    Comment: Social research officer, government  Tobacco Use  . Smoking status: Former Smoker    Packs/day: 2.00    Years: 5.00    Pack years: 10.00    Types: Cigarettes    Quit date: 09/22/1979    Years since quitting: 40.4  . Smokeless tobacco: Never Used  Substance and Sexual Activity  . Alcohol use: Yes    Alcohol/week: 6.0 standard drinks    Types: 6 Shots of liquor per week    Comment: quit drinking in 12/2019  . Drug use: No  . Sexual activity: Yes    Birth control/protection: Spermicide  Other Topics Concern  . Not on file  Social History Narrative   0 caffeine drinks daily    Social Determinants of Health   Financial Resource Strain:   . Difficulty of Paying Living Expenses:   Food Insecurity:   . Worried About Charity fundraiser in the Last Year:   . Arboriculturist in the Last Year:   Transportation Needs:   . Film/video editor (Medical):   Marland Kitchen Lack of Transportation (Non-Medical):   Physical Activity:   . Days of Exercise per Week:   . Minutes of Exercise per Session:   Stress:   . Feeling of Stress :   Social Connections:   . Frequency of Communication with Friends and Family:   . Frequency of Social  Gatherings with Friends and Family:   . Attends Religious Services:   . Active Member of Clubs or Organizations:   . Attends Archivist Meetings:   Marland Kitchen Marital Status:    Family History  Problem Relation Age of Onset  . Heart disease Mother   . Heart attack Mother   . Hypertension Mother   . Kidney failure Mother   . Liver disease Mother        stage 4 liver disease  . Prostate cancer Father   . Cancer Father        lung  . Hypertension Brother   . Gout Brother   . Alcoholism Maternal Grandfather   . Liver disease Maternal Grandfather   . Migraines Sister   . Hypertension Brother   . Colon cancer Neg Hx    Scheduled Meds: . sodium chloride   Intravenous Once  . allopurinol  200 mg Oral Daily  . Ferrous Fumarate  1 tablet Oral Daily  . folic acid  1 mg Oral Daily  . lactulose  30 g Oral BID  . macitentan  10 mg Oral Daily  . multivitamin with minerals  1 tablet Oral Daily  . pantoprazole  40 mg Oral Daily  . potassium chloride SA  40 mEq Oral BID  . rosuvastatin  5 mg Oral q1800  . Selexipag  1,000 mcg Oral BID  . tadalafil (PAH)  40 mg Oral Daily  . thiamine  100 mg Oral Daily  . torsemide  60 mg Oral BID   Continuous Infusions: PRN Meds:.hydrALAZINE, HYDROcodone-acetaminophen, morphine injection, oxymetazoline Allergies  Allergen Reactions  . Aspirin Hypertension  . Nitroglycerin Other (See Comments)    Severe Headache     Vital Signs: BP 115/72 (BP Location: Left Arm)   Pulse 83   Temp 98.5 F (36.9 C) (Axillary)   Resp 18   Ht _0  (1.854 m)   Wt 87.1  kg   SpO2 97%   BMI 25.33 kg/m  Pain Scale: 0-10   Pain Score: 0-No pain   SpO2: SpO2: 97 % O2 Device:SpO2: 97 % O2 Flow Rate: .   IO: Intake/output summary:   Intake/Output Summary (Last 24 hours) at 02/26/2020 1242 Last data filed at 02/26/2020 0515 Gross per 24 hour  Intake 425 ml  Output 450 ml  Net -25 ml    LBM: Last BM Date: 02/26/20 Baseline Weight: Weight: 87.1 kg Most  recent weight: Weight: 87.1 kg     Palliative Assessment/Data: 60%     Time In: 2:00 Time out:  2:45 Time in:  5:00 Time out:  6:00  Time Total: 105 min. Visit consisted of counseling and education dealing with the complex and emotionally intense issues surrounding the need for palliative care and symptom management in the setting of serious and potentially life-threatening illness. Greater than 50%  of this time was spent counseling and coordinating care related to the above assessment and plan.  Signed by: Florentina Jenny, PA-C Palliative Medicine  Please contact Palliative Medicine Team phone at [phone] for questions and concerns.  For individual provider: See Shea Evans

## 2020-02-26 NOTE — Significant Event (Signed)
Rapid Response Event Note  Overview: Called d/t epistaxis   Initial Focused Assessment: Pt sitting up in bed, alert and oriented. RN holding gauze on nose d/t nose bleed. Pressure held to upper part of nose for approximately 30 minutes. Ice pack also held on nose. Afrin nasal spray administered and afrin soaked gauze placed in B nostrils. After a few minutes, R nostril gauze fell out and there was no more bleeding coming from R nostril. The L nostril was still bleeding so gauze left in place. After a few more minutes, pt requested gauze be removed from L nostril. Gauze removed along with a large clot. L nostril still noted to be bleeding but it has lessened. Afrin spray used again to L nostril and afrin soaked gauze placed back in L nostril. Pt c/o cramps in his stomach and some dizziness during this time. HR-82, BP-117/65, RR-18,SpO2-96% on RA.   Interventions: Ice pack to nose Afrin nasal spray Type and screen 1 pack platelets  Plan of Care (if not transferred): Give platelets. Keep afrin soaked gauze in nose. Monitor pt closely. Call RRT if further assistance needed.  Event Summary:  B[place]heimer, NP notified PTA RRT Called: 0052 Arrived: 0057 Ended: 0200  Dillard [place]

## 2020-02-26 NOTE — Evaluation (Signed)
Physical Therapy Evaluation Patient Details Name: Darren Mueller MRN: [id number] DOB: 21-Apr-1951 Today's Date: 02/26/2020   History of Present Illness  69 y.o. male with medical history significant of hypothyroidism; OSA not on CPAP; seizures; HLD; HTN; CAD; COPD; stage 3 CKD; alcoholic cirrhosis with ascites; h/o ETOH dependence; and chronic diastolic CHF presenting with AMS.  His daughter reports that he has been very confused, disoriented, weaker than normal.  Clinical Impression  Pt presents to PT with deficits in cognition, gait, balance, endurance, power, strength, tone, gait, functional mobility. Pt with short shuffling gait, increased tone in BLE, kyphotic posture, and generalized weakness. Pt able to increase step length with cues but quickly reverts to shuffling gait without cueing. Gait pattern not significantly changed with use of RW. Pt scored 32/56 on BERG balance test indicating the patient is at a high falls risk. Pt currently recommending CIR at time of discharge as pt was independent before recent admissions and demonstrates the potential to return to this level of mobility.    Follow Up Recommendations CIR;Supervision/Assistance - 24 hour    Equipment Recommendations  Rolling walker with 5" wheels    Recommendations for Other Services Rehab consult     Precautions / Restrictions Precautions Precautions: Fall Precaution Comments: high fall Restrictions Weight Bearing Restrictions: No      Mobility  Bed Mobility Overal bed mobility: Needs Assistance Bed Mobility: Supine to Sit;Sit to Supine   Sidelying to sit: Supervision Supine to sit: Supervision Sit to supine: Supervision   General bed mobility comments: no physical assist  Transfers Overall transfer level: Needs assistance Equipment used: None Transfers: Sit to/from Stand Sit to Stand: Min guard Stand pivot transfers: Min guard       General transfer comment: use of RW and cues for proper hand  placement  Ambulation/Gait Ambulation/Gait assistance: Min guard Gait Distance (Feet): 100 Feet Assistive device: Rolling walker (2 wheeled);None Gait Pattern/deviations: Step-to pattern;Shuffle Gait velocity: decreased Gait velocity interpretation: <1.8 ft/sec, indicate of risk for recurrent falls General Gait Details: pt with short shuffling steps, able to progress to step through gait with mild increase in gait speed with cues but quickly reverts to short shuffling steps without cues  Stairs            Wheelchair Mobility    Modified Rankin (Stroke Patients Only)       Balance Overall balance assessment: Needs assistance Sitting-balance support: No upper extremity supported;Feet supported Sitting balance-Leahy Scale: Good Sitting balance - Comments: supervision   Standing balance support: No upper extremity supported Standing balance-Leahy Scale: Fair Standing balance comment: minG for static standing balance                 Standardized Balance Assessment Standardized Balance Assessment : Berg Balance Test Berg Balance Test Sit to Stand: Able to stand  independently using hands Standing Unsupported: Able to stand 2 minutes with supervision Sitting with Back Unsupported but Feet Supported on Floor or Stool: Able to sit safely and securely 2 minutes Stand to Sit: Controls descent by using hands Transfers: Able to transfer safely, minor use of hands Standing Unsupported with Eyes Closed: Able to stand 10 seconds with supervision Standing Ubsupported with Feet Together: Able to place feet together independently and stand for 1 minute with supervision From Standing, Reach Forward with Outstretched Arm: Reaches forward but needs supervision From Standing Position, Pick up Object from Floor: Able to pick up shoe, needs supervision From Standing Position, Turn to Look Behind Over each Shoulder:  Turn sideways only but maintains balance Turn 360 Degrees: Needs close  supervision or verbal cueing Standing Unsupported, Alternately Place Feet on Step/Stool: Needs assistance to keep from falling or unable to try Standing Unsupported, One Foot in Front: Able to take small step independently and hold 30 seconds Standing on One Leg: Unable to try or needs assist to prevent fall Total Score: 32         Pertinent Vitals/Pain Pain Assessment: No/denies pain Faces Pain Scale: Hurts a little bit Pain Location: nose Pain Descriptors / Indicators: Discomfort Pain Intervention(s): Monitored during session    Home Living Family/patient expects to be discharged to:: Private residence Living Arrangements: Children Available Help at Discharge: Family;Available PRN/intermittently(son returning to school soon, possible 24/7 with family) Type of Home: House Home Access: Stairs to enter Entrance Stairs-Rails: Can reach both Entrance Stairs-Number of Steps: 3 Home Layout: One level Home Equipment: None      Prior Function Level of Independence: Needs assistance   Gait / Transfers Assistance Needed: pt mobilizes independently at baseline, requiring some assistance and working with therapise since recent hospitalizations  ADL's / Homemaking Assistance Needed: Pt performing ADL and light IADL tasks. Pt's family members providing medication management in pill box. Pt does not require reminders to take meds.  Comments: patient drives, family      Hand Dominance   Dominant Hand: Right    Extremity/Trunk Assessment   Upper Extremity Assessment Upper Extremity Assessment: Generalized weakness    Lower Extremity Assessment Lower Extremity Assessment: Generalized weakness RLE Deficits / Details: grossly 4/5, edematous, extensor tone noted in quadriceps, lacking 5 degrees knee extension AROM LLE Deficits / Details: grossly 4/5, edematous, extensor tone noted in quadriceps, lacking 5 degrees knee extension AROM    Cervical / Trunk Assessment Cervical / Trunk  Assessment: Kyphotic(stiff cervical spine)  Communication   Communication: No difficulties  Cognition Arousal/Alertness: Awake/alert Behavior During Therapy: WFL for tasks assessed/performed Overall Cognitive Status: Impaired/Different from baseline Area of Impairment: Memory;Following commands;Safety/judgement;Awareness;Problem solving                 Orientation Level: Disoriented to;Time;Situation   Memory: Decreased recall of precautions;Decreased short-term memory Following Commands: Follows one step commands consistently Safety/Judgement: Decreased awareness of safety;Decreased awareness of deficits Awareness: Emergent Problem Solving: Slow processing;Decreased initiation;Requires verbal cues General Comments: Pt following 1 and 2 step commands; pt able to recall 3 items immediately, after 2 mins and 56mnutes, but pt onyl able to recall 1/3 items after 10 mins. Pt requiring cues to remember the month and year x2 attempts, 3rd attempt, pt able to recall month      General Comments General comments (skin integrity, edema, etc.): pt's sister in present, family expressing concern over recent re-admissions    Exercises     Assessment/Plan    PT Assessment Patient needs continued PT services  PT Problem List Decreased strength;Decreased range of motion;Decreased activity tolerance;Decreased balance;Decreased mobility;Decreased cognition;Decreased knowledge of use of DME;Decreased safety awareness;Decreased knowledge of precautions;Impaired tone       PT Treatment Interventions DME instruction;Gait training;Stair training;Functional mobility training;Therapeutic activities;Therapeutic exercise;Neuromuscular re-education;Balance training;Patient/family education;Cognitive remediation    PT Goals (Current goals can be found in the Care Plan section)  Acute Rehab PT Goals Patient Stated Goal: to return to independent mobility PT Goal Formulation: With patient Time For Goal  Achievement: 03/11/20 Potential to Achieve Goals: Good Additional Goals Additional Goal #1: Pt will maintain dynamic standign balance within 10 inches of his base of support without UE support  independently    Frequency Min 3X/week   Barriers to discharge        Co-evaluation               AM-PAC PT "6 Clicks" Mobility  Outcome Measure Help needed turning from your back to your side while in a flat bed without using bedrails?: None Help needed moving from lying on your back to sitting on the side of a flat bed without using bedrails?: None Help needed moving to and from a bed to a chair (including a wheelchair)?: A Little Help needed standing up from a chair using your arms (e.g., wheelchair or bedside chair)?: A Little Help needed to walk in hospital room?: A Little Help needed climbing 3-5 steps with a railing? : A Lot 6 Click Score: 19    End of Session   Activity Tolerance: Patient tolerated treatment well Patient left: in bed;with call bell/phone within reach;with bed alarm set;with family/visitor present Nurse Communication: Mobility status PT Visit Diagnosis: Muscle weakness (generalized) (M62.81);Difficulty in walking, not elsewhere classified (R26.2);Other symptoms and signs involving the nervous system (R29.898)    Time: 9678-9381 PT Time Calculation (min) (ACUTE ONLY): 33 min   Charges:   PT Evaluation $PT Eval Moderate Complexity: 1 Mod PT Treatments $Gait Training: 8-22 mins        Zenaida Niece, PT, DPT Acute Rehabilitation Pager: [phone]   Zenaida Niece 02/26/2020, 9:30 AM

## 2020-02-26 NOTE — Progress Notes (Signed)
Progress Note    Darren Mueller  PPI:[id number] DOB: 11-11-1950  DOA: 02/25/2020 PCP: Mosie Lukes, MD    Brief Narrative:     Medical records reviewed and are as summarized below:  Darren Mueller is an 69 y.o. male with medical history significant of hypothyroidism; OSA not on CPAP; seizures; HLD; HTN; CAD; COPD; stage 3 CKD; alcoholic cirrhosis with ascites; h/o ETOH dependence; and chronic diastolic CHF presenting with AMS.  His daughter reports that he has been very confused, disoriented, weaker than normal.  Yesterday, he walked with OT but today he was slumped over the table and unable to stand on his own.  Also not oriented even to person in the ER.  He has never had this issue before and does not take lactulose at home.  He was perfectly himself yesterday.  He does have known alcoholic liver disease with h/o ascites.  No ETOH in the last 2 months.  Assessment/Plan:   Principal Problem:   Hepatic encephalopathy (HCC) Active Problems:   Hypertension   Alcohol dependence ([place])   PAH (pulmonary arterial hypertension) with portal hypertension (HCC)   Stage 4 chronic kidney disease (HCC)   OSA (obstructive sleep apnea)   Pancytopenia (HCC)   Cirrhosis with alcoholism (HCC)   Hyperlipidemia   Seizure disorder (HCC)   COPD (chronic obstructive pulmonary disease) (HCC)   Chronic diastolic CHF (congestive heart failure) ([place])   Hepatic encephalopathy -Patient with known alcoholic cirrhosis which has been decompensated with ascites (none present currently) -Presenting with acute onset of AMS this morning -Ammonia 112 elevated -He does not appear to have new electrolyte abnormalities, worsening renal failure, hypoglycemia, or other apparent triggers  -Needs lactulose titrated to 2-3 soft stools/day -PT/OT consults -currently recommending CIR  Cirrhosis -MELD/MELD-Na score is 23/24, with a mortality rate of 19.6% -He is not a candidate for liver transplant, as he  stopped drinking only 2 months ago -Continue diuretics -Patient follows with Dr. Ardis Hughs: We will defer  addition of BB and also Rifaximin (unless he continues to be encephalopathic with proper lactulose dose)  Pulmonary HTN/COPD -Continue home meds - Opsumit, Selexipag, Tadalafil -He appears to be compensated from this standpoint at this time -Of note, all 3 of these medications can cause liver dysfunction or need dose adjustment in the setting of liver failure; outpatient discussion with his pulmonologist for ongoing discussion will be important in trying to balance his advanced pulmonary needs vs. His advancing hepatic issues  Pancytopenia -Appears to be stable -Avoid heparin, NSAIDs -Continue Hemocute  Stage 4 CKD -Advanced CKD, followed by Dr. Marval Regal -Appears to be stable at this time  History of alcohol abuse -Patient states he has not used for 2 months -Alcohol level here negative -No signs of withdrawal  HLD -Continue Crestor  Chronic diastolic CHF -0/63/01 echo with presented EF, moderate LVH, and grade 2 diastolic dysfunction -Marked LE edema but his daughter reports that this is improved from prior -Renal function is stable despite recent diuresis -Patient reports that plan was to increase torsemide dose from 60 BID to 80 BID; however last cardiology note indicates plan for 60 mg PO BID -For now, will continue 60 mg PO BID and continue to follow  Seizures -Followed by Dr. Delice Lesch -Continue Keppra  OSA -Not on CPAP   Patient has failure of multiple organ systems including heart/lungs/liver/kidneys.  He is still a full code.  I discussed my concerns with both patient's sister and patient and have recommended a  talk with palliative care for goals of care and goals going forward.  Patient and family's current goal goal is to rehab and avoid further hospitalizations.  Family Communication/Anticipated D/C date and plan/Code Status   DVT prophylaxis:  SCDs Code Status: Full Code.  Family Communication: Sister at bedside Disposition Plan: Status is: Observation  The patient will require care spanning > 2 midnights and should be moved to inpatient because: Inpatient level of care appropriate due to severity of illness  Dispo: The patient is from: Home              Anticipated d/c is to: CIR              Anticipated d/c date is: 3 days              Patient currently is not medically stable to d/c.         Medical Consultants:    Palliative care     Subjective:   Patient developed a nosebleed last night  Objective:    Vitals:   02/26/20 0130 02/26/20 0200 02/26/20 0312 02/26/20 0513  BP: 103/68 95/60 109/69 115/74  Pulse: 94 87 89 81  Resp: _0 Temp: 98.2 F (36.8 C) 98.1 F (36.7 C) 98.1 F (36.7 C) 98.1 F (36.7 C)  TempSrc: Oral Axillary Axillary Oral  SpO2: 96% 95% 97% 97%  Weight:      Height:        Intake/Output Summary (Last 24 hours) at 02/26/2020 1056 Last data filed at 02/26/2020 0515 Gross per 24 hour  Intake 425 ml  Output 450 ml  Net -25 ml   Filed Weights   02/25/20 1749  Weight: 87.1 kg    Exam:  General: Appearance:     Chronically ill male in no acute distress  Eyes:    PERRL, conjunctiva/corneas clear, EOM's intact       Lungs:     Clear to auscultation bilaterally, respirations unlabored  Heart:    Normal heart rate. Normal rhythm. No murmurs, rubs, or gallops.   MS:   All extremities are intact.   Neurologic:   Awake, alert, oriented x 3. Falls asleep easily.    Dried blood in left nare- removed gauze (use Afrin)  Data Reviewed:   I have personally reviewed following labs and imaging studies:  Labs: Labs show the following:   Basic Metabolic Panel: Recent Labs  Lab 02/23/20 1120 02/23/20 1120 02/25/20 1144 02/26/20 0108  NA 135  --  138 138  K 3.8   < > 3.7 3.6  CL 101  --  104 105  CO2 21*  --  21* 21*  GLUCOSE 104*  --  127* 111*  BUN 42*  --  48*  46*  CREATININE 3.05*  --  2.94* 2.50*  CALCIUM 9.0  --  9.3 9.3   < > = values in this interval not displayed.   GFR Estimated Creatinine Clearance: 31.1 mL/min (A) (by C-G formula based on SCr of 2.5 mg/dL (H)). Liver Function Tests: Recent Labs  Lab 02/25/20 1144 02/26/20 0108  AST 83* 92*  ALT 36 39  ALKPHOS 271* 264*  BILITOT 2.7* 2.6*  PROT 6.8 6.7  ALBUMIN 3.1* 3.0*   No results for input(s): LIPASE, AMYLASE in the last 168 hours. Recent Labs  Lab 02/25/20 1144 02/26/20 0108  AMMONIA 112* 80*   Coagulation profile Recent Labs  Lab 02/25/20 1144 02/26/20 0108  INR 1.2 1.2  CBC: Recent Labs  Lab 02/23/20 1120 02/25/20 1144 02/26/20 0108  WBC 2.6* 2.7* 3.0*  NEUTROABS  --  1.7  --   HGB 10.4* 10.6* 10.5*  HCT 33.1* 34.3* 32.5*  MCV 103.4* 106.2* 101.2*  PLT 28* 26* 27*   Cardiac Enzymes: No results for input(s): CKTOTAL, CKMB, CKMBINDEX, TROPONINI in the last 168 hours. BNP (last 3 results) Recent Labs    10/11/19 1138 12/08/19 1041 01/11/20 1439  PROBNP 604.0* 710.0* 494.0*   CBG: Recent Labs  Lab 02/25/20 1129  GLUCAP 107*   D-Dimer: No results for input(s): DDIMER in the last 72 hours. Hgb A1c: No results for input(s): HGBA1C in the last 72 hours. Lipid Profile: No results for input(s): CHOL, HDL, LDLCALC, TRIG, CHOLHDL, LDLDIRECT in the last 72 hours. Thyroid function studies: No results for input(s): TSH, T4TOTAL, T3FREE, THYROIDAB in the last 72 hours.  Invalid input(s): FREET3 Anemia work up: No results for input(s): VITAMINB12, FOLATE, FERRITIN, TIBC, IRON, RETICCTPCT in the last 72 hours. Sepsis Labs: Recent Labs  Lab 02/23/20 1120 02/25/20 1144 02/26/20 0108  WBC 2.6* 2.7* 3.0*    Microbiology Recent Results (from the past 240 hour(s))  Respiratory Panel by RT PCR (Flu A&B, Covid) - Nasopharyngeal Swab     Status: None   Collection Time: 02/25/20  1:19 PM   Specimen: Nasopharyngeal Swab  Result Value Ref Range  Status   SARS Coronavirus 2 by RT PCR NEGATIVE NEGATIVE Final    Comment: (NOTE) SARS-CoV-2 target nucleic acids are NOT DETECTED. The SARS-CoV-2 RNA is generally detectable in upper respiratoy specimens during the acute phase of infection. The lowest concentration of SARS-CoV-2 viral copies this assay can detect is 131 copies/mL. A negative result does not preclude SARS-Cov-2 infection and should not be used as the sole basis for treatment or other patient management decisions. A negative result may occur with  improper specimen collection/handling, submission of specimen other than nasopharyngeal swab, presence of viral mutation(s) within the areas targeted by this assay, and inadequate number of viral copies (<131 copies/mL). A negative result must be combined with clinical observations, patient history, and epidemiological information. The expected result is Negative. Fact Sheet for Patients:  PinkCheek.be Fact Sheet for Healthcare Providers:  GravelBags.it This test is not yet ap proved or cleared by the [place] FDA and  has been authorized for detection and/or diagnosis of SARS-CoV-2 by FDA under an Emergency Use Authorization (EUA). This EUA will remain  in effect (meaning this test can be used) for the duration of the COVID-19 declaration under Section 564(b)(1) of the Act, 21 U.S.C. section 360bbb-3(b)(1), unless the authorization is terminated or revoked sooner.    Influenza A by PCR NEGATIVE NEGATIVE Final   Influenza B by PCR NEGATIVE NEGATIVE Final    Comment: (NOTE) The Xpert Xpress SARS-CoV-2/FLU/RSV assay is intended as an aid in  the diagnosis of influenza from Nasopharyngeal swab specimens and  should not be used as a sole basis for treatment. Nasal washings and  aspirates are unacceptable for Xpert Xpress SARS-CoV-2/FLU/RSV  testing. Fact Sheet for  Patients: PinkCheek.be Fact Sheet for Healthcare Providers: GravelBags.it This test is not yet approved or cleared by the [place] FDA and  has been authorized for detection and/or diagnosis of SARS-CoV-2 by  FDA under an Emergency Use Authorization (EUA). This EUA will remain  in effect (meaning this test can be used) for the duration of the  Covid-19 declaration under Section 564(b)(1) of the Act, 21  U.S.C.  section 360bbb-3(b)(1), unless the authorization is  terminated or revoked. Performed at [facility], [place] [address] [place], [place] [address]     Procedures and diagnostic studies:  DG Chest 2 View  Result Date: 02/25/2020 CLINICAL DATA:  69 year old male with altered mental status. EXAM: CHEST - 2 VIEW COMPARISON:  02/02/2020 and prior chest radiographs FINDINGS: Cardiomegaly and possible mild pulmonary vascular congestion again noted. There is no evidence of focal airspace disease, pulmonary edema, suspicious pulmonary nodule/mass, pleural effusion, or pneumothorax. No acute bony abnormalities are identified. IMPRESSION: Cardiomegaly with possible mild pulmonary vascular congestion. Electronically Signed   By: Margarette Canada M.D.   On: 02/25/2020 12:26   CT Head Wo Contrast  Result Date: 02/25/2020 CLINICAL DATA:  69 year old male with altered mental status. EXAM: CT HEAD WITHOUT CONTRAST TECHNIQUE: Contiguous axial images were obtained from the base of the skull through the vertex without intravenous contrast. COMPARISON:  01/24/2020 and prior CTs FINDINGS: Brain: No evidence of acute infarction, hemorrhage, hydrocephalus, extra-axial collection or mass lesion/mass effect. Atrophy and mild chronic small-vessel white matter ischemic changes again noted. Vascular: Carotid atherosclerotic calcifications are noted. Skull: Normal. Negative for fracture or focal lesion. Sinuses/Orbits: No acute finding. Other: None.  IMPRESSION: 1. No evidence of acute intracranial abnormality. 2. Atrophy and chronic small-vessel white matter ischemic changes. Electronically Signed   By: Margarette Canada M.D.   On: 02/25/2020 12:28    Medications:   . sodium chloride   Intravenous Once  . allopurinol  200 mg Oral Daily  . Ferrous Fumarate  1 tablet Oral Daily  . folic acid  1 mg Oral Daily  . lactulose  30 g Oral BID  . macitentan  10 mg Oral Daily  . multivitamin with minerals  1 tablet Oral Daily  . pantoprazole  40 mg Oral Daily  . potassium chloride SA  40 mEq Oral BID  . rosuvastatin  5 mg Oral q1800  . Selexipag  1,000 mcg Oral BID  . tadalafil (PAH)  40 mg Oral Daily  . thiamine  100 mg Oral Daily  . torsemide  60 mg Oral BID   Continuous Infusions:   LOS: 0 days   Geradine Girt  [facility]   How to contact the [facility] Attending or Consulting provider [place] or covering provider during after hours [place], for this patient?  1. Check the care team in [facility] and look for a) attending/consulting [facility] provider listed and b) the [facility] team listed 2. Log into [email or web address] and use [place]'s universal password to access. If you do not have the password, please contact the hospital operator. 3. Locate the [facility] provider you are looking for under [facility] and page to a number that you can be directly reached. 4. If you still have difficulty reaching the provider, please page the [facility] (Director on Call) for the Hospitalists listed on amion for assistance.  02/26/2020, 10:56 AM

## 2020-02-26 NOTE — Progress Notes (Signed)
Rehab Admissions Coordinator Note:  Patient was screened by Cleatrice Burke for appropriateness for an Inpatient Acute Rehab Consult per therapy recs. Noted pt just admitted and in observation status. I will follow his progress to assess his needs as medical work up ongoing.  Cleatrice Burke RN MSN 02/26/2020, 12:54 PM  I can be reached at [phone].

## 2020-02-26 NOTE — Progress Notes (Signed)
Patient called RN to d/t nose bleed. Nose assessed and saw moderate active bleeding in the left nostril. RN sat patient upright, tilted head forward slightly to apply pressure and  ice pack placed on the bridge of the nose. Pressure was applied to pt's upper nose for about 30 min's without any signs of bleeding stopping. On-call provider was notified and RRT also called for assistance. Afrin, type and screen and 1 unit of platelets were ordered by on-call provider.See orders in epic.Vitals were stable. Patient received platelets as ordered.At 0250 platelets was completed and patient's bleeding was seen to subsided. At 0310 bleeding has completed stopped with the soaked gauze with afrin left in the left nostril. BP 109/69   Pulse 89   Temp 98.1 F (36.7 C) (Axillary)   Resp 20   Ht 6' 1" (1.854 m)   Wt 87.1 kg   SpO2 97%   BMI 25.33 kg/m   Patient stable, sister at bedside. Will cont. to monitor.

## 2020-02-27 ENCOUNTER — Encounter: Payer: Self-pay | Admitting: Family Medicine

## 2020-02-27 ENCOUNTER — Encounter (HOSPITAL_COMMUNITY): Payer: Self-pay

## 2020-02-27 ENCOUNTER — Telehealth: Payer: Self-pay

## 2020-02-27 ENCOUNTER — Encounter: Payer: Self-pay | Admitting: Physical Therapy

## 2020-02-27 ENCOUNTER — Ambulatory Visit: Payer: Medicare Other

## 2020-02-27 ENCOUNTER — Inpatient Hospital Stay (HOSPITAL_COMMUNITY): Payer: Medicare Other

## 2020-02-27 DIAGNOSIS — Z515 Encounter for palliative care: Secondary | ICD-10-CM

## 2020-02-27 DIAGNOSIS — R4182 Altered mental status, unspecified: Secondary | ICD-10-CM

## 2020-02-27 DIAGNOSIS — N184 Chronic kidney disease, stage 4 (severe): Secondary | ICD-10-CM

## 2020-02-27 LAB — CBC
HCT: 32.5 % — ABNORMAL LOW (ref 39.0–52.0)
Hemoglobin: 10 g/dL — ABNORMAL LOW (ref 13.0–17.0)
MCH: 32.5 pg (ref 26.0–34.0)
MCHC: 30.8 g/dL (ref 30.0–36.0)
MCV: 105.5 fL — ABNORMAL HIGH (ref 80.0–100.0)
Platelets: 33 10*3/uL — ABNORMAL LOW (ref 150–400)
RBC: 3.08 MIL/uL — ABNORMAL LOW (ref 4.22–5.81)
RDW: 18.8 % — ABNORMAL HIGH (ref 11.5–15.5)
WBC: 3.4 10*3/uL — ABNORMAL LOW (ref 4.0–10.5)
nRBC: 0 % (ref 0.0–0.2)

## 2020-02-27 LAB — BPAM PLATELET PHERESIS
Blood Product Expiration Date: 202104262359
ISSUE DATE / TIME: 202104250139
Unit Type and Rh: 6200

## 2020-02-27 LAB — BASIC METABOLIC PANEL
Anion gap: 10 (ref 5–15)
BUN: 56 mg/dL — ABNORMAL HIGH (ref 8–23)
CO2: 22 mmol/L (ref 22–32)
Calcium: 9 mg/dL (ref 8.9–10.3)
Chloride: 107 mmol/L (ref 98–111)
Creatinine, Ser: 2.68 mg/dL — ABNORMAL HIGH (ref 0.61–1.24)
GFR calc Af Amer: 27 mL/min — ABNORMAL LOW (ref >60)
GFR calc non Af Amer: 23 mL/min — ABNORMAL LOW (ref >60)
Glucose, Bld: 137 mg/dL — ABNORMAL HIGH (ref 70–99)
Potassium: 4.2 mmol/L (ref 3.5–5.1)
Sodium: 139 mmol/L (ref 135–145)

## 2020-02-27 LAB — PREPARE PLATELET PHERESIS: Unit division: 0

## 2020-02-27 MED ORDER — LACTULOSE 10 GM/15ML PO SOLN
20.0000 g | Freq: Two times a day (BID) | ORAL | Status: DC
  Filled 2020-02-27: qty 30

## 2020-02-27 NOTE — Plan of Care (Signed)
  Problem: Education: Goal: Knowledge of General Education information will improve Description: Including pain rating scale, medication(s)/side effects and non-pharmacologic comfort measures Outcome: Progressing   Problem: Clinical Measurements: Goal: Ability to maintain clinical measurements within normal limits will improve Outcome: Progressing Goal: Will remain free from infection Outcome: Progressing   Problem: Activity: Goal: Risk for activity intolerance will decrease Outcome: Progressing Note: Pt walking around in room and hallway today   Problem: Nutrition: Goal: Adequate nutrition will be maintained Outcome: Progressing   Problem: Coping: Goal: Level of anxiety will decrease Outcome: Progressing   Problem: Elimination: Goal: Will not experience complications related to bowel motility Outcome: Progressing Goal: Will not experience complications related to urinary retention Outcome: Progressing   Problem: Pain Managment: Goal: General experience of comfort will improve Outcome: Progressing   Problem: Safety: Goal: Ability to remain free from injury will improve Outcome: Progressing

## 2020-02-27 NOTE — Progress Notes (Signed)
Physical Therapy Treatment Patient Details Name: Darren Mueller MRN: [id number] DOB: [birth year] Today's Date: 02/27/2020    History of Present Illness JOHNOTHAN BASCOMB is a 69 y.o. male s/p presenting with AMS. and weakness found to have hepatic encephalopathy and involves alcoholic cirrhosis. CT head negative. PMHx: hypothyroidism; OSA not on CPAP; seizures; HLD; HTN; CAD; COPD; stage 3 CKD; alcoholic cirrhosis with ascites; h/o ETOH dependence; and chronic diastolic CHF.    PT Comments    Pt in bed upon arrival of PT, agreeable to PT session with focus on progression of functional stability and gait pattern. The pt was able to demo slight improvements in ambulation endurance, as well as good improvements in gait pattern, but continues to demo need for consistent VCs to maintain improvements in gait pattern. The pt also completed multiple general LE strengthening exercises, but requires seated rest following the ambulation and exercises, reporting RPE of 8/10. Continue to recommend CIR as the pt was previously independent and will benefit from intensive rehab to regain prior level of functional independence and safety with mobility.    Follow Up Recommendations  CIR;Supervision/Assistance - 24 hour     Equipment Recommendations  (defer to post acute)    Recommendations for Other Services       Precautions / Restrictions Precautions Precautions: Fall Precaution Comments: high fall Restrictions Weight Bearing Restrictions: No    Mobility  Bed Mobility Overal bed mobility: Needs Assistance Bed Mobility: Supine to Sit     Supine to sit: Modified independent (Device/Increase time)     General bed mobility comments: np physical assist needed, pt able to rise without cues or guidance  Transfers Overall transfer level: Needs assistance Equipment used: None Transfers: Sit to/from Stand Sit to Stand: Min guard         General transfer comment: pt reports he does not need  RW< able to complete without AD, but with HHA of 1 with fatigue  Ambulation/Gait Ambulation/Gait assistance: Min guard Gait Distance (Feet): 80 Feet(80 + 60 ft) Assistive device: None Gait Pattern/deviations: Step-to pattern;Shuffle;Decreased dorsiflexion - right;Decreased dorsiflexion - left Gait velocity: 0.5 m/s Gait velocity interpretation: <1.8 ft/sec, indicate of risk for recurrent falls General Gait Details: pt with short shuffling steps, able to progress to step through gait with mild increase in gait speed with cues but quickly reverts to short shuffling steps without cues   Stairs             Wheelchair Mobility    Modified Rankin (Stroke Patients Only)       Balance Overall balance assessment: Needs assistance Sitting-balance support: Feet supported Sitting balance-Leahy Scale: Good Sitting balance - Comments: supervision   Standing balance support: During functional activity Standing balance-Leahy Scale: Fair Standing balance comment: minG for static standing balance                            Cognition Arousal/Alertness: Awake/alert Behavior During Therapy: WFL for tasks assessed/performed Overall Cognitive Status: Impaired/Different from baseline Area of Impairment: Safety/judgement;Awareness;Problem solving;Following commands;Memory                     Memory: Decreased short-term memory Following Commands: Follows one step commands consistently;Follows multi-step commands with increased time;Follows multi-step commands inconsistently Safety/Judgement: Decreased awareness of safety;Decreased awareness of deficits Awareness: Intellectual Problem Solving: Slow processing;Decreased initiation;Requires verbal cues General Comments: pt with good command folling today, able to complete multi-step commands without cues during mobility  Exercises General Exercises - Lower Extremity Long Arc Quad: Strengthening;Seated;Both;10  reps(resisted knee ext and flexion) Heel Slides: Strengthening;Seated;Both;10 reps(resisted knee ext and flexion) Hip Flexion/Marching: AROM;Both;15 reps(during ambulation) Heel Raises: Strengthening;Both;10 reps;Standing Mini-Sqauts: Strengthening;Both;10 reps;Standing    General Comments        Pertinent Vitals/Pain Pain Assessment: 0-10 Pain Score: 8  Pain Location: headache (pt and daughter think it is from caffiene withdrawl) Pain Descriptors / Indicators: Headache;Grimacing Pain Intervention(s): Limited activity within patient's tolerance;Monitored during session;Repositioned;Premedicated before session    Home Living                      Prior Function            PT Goals (current goals can now be found in the care plan section) Acute Rehab PT Goals Patient Stated Goal: to go to rehab to become independent again PT Goal Formulation: With patient Time For Goal Achievement: 03/11/20 Potential to Achieve Goals: Good Additional Goals Additional Goal #1: Pt will maintain dynamic standign balance within 10 inches of his base of support without UE support independently Progress towards PT goals: Progressing toward goals    Frequency    Min 3X/week      PT Plan Current plan remains appropriate    Co-evaluation              AM-PAC PT "6 Clicks" Mobility   Outcome Measure  Help needed turning from your back to your side while in a flat bed without using bedrails?: None Help needed moving from lying on your back to sitting on the side of a flat bed without using bedrails?: None Help needed moving to and from a bed to a chair (including a wheelchair)?: A Little Help needed standing up from a chair using your arms (e.g., wheelchair or bedside chair)?: A Little Help needed to walk in hospital room?: A Little Help needed climbing 3-5 steps with a railing? : A Lot 6 Click Score: 19    End of Session Equipment Utilized During Treatment: Gait  belt Activity Tolerance: Patient tolerated treatment well Patient left: in bed;with call bell/phone within reach;with family/visitor present(pt sitting EOB) Nurse Communication: Mobility status PT Visit Diagnosis: Muscle weakness (generalized) (M62.81);Difficulty in walking, not elsewhere classified (R26.2);Other symptoms and signs involving the nervous system (R29.898)     Time: 0459-9774 PT Time Calculation (min) (ACUTE ONLY): 26 min  Charges:  $Gait Training: 8-22 mins $Therapeutic Exercise: 8-22 mins                     Karma Ganja, PT, DPT   Acute Rehabilitation Department Pager #: [phone]   Otho Bellows 02/27/2020, 11:33 AM

## 2020-02-27 NOTE — Progress Notes (Signed)
RN called to room by patient and daughter. Per patient and daughter, patient no longer wants to be a DNR and would like to be changed back to a full code. MD paged through [facility] and updated on current situation, awaiting response.

## 2020-02-27 NOTE — Consult Note (Addendum)
Advanced Heart Failure Team Consult Note   Primary Physician: Mosie Lukes, MD PCP-Cardiologist:  Glori Bickers, MD  Reason for Consultation: Pulmonary Hypertension   HPI:    Darren Mueller is seen today for evaluation of pulmonary hypertension at the request of Dr Eliseo Squires.   Darren Mueller is a 69 year old with history of COPD, PAH, RV failure,cirrhosis,and ETOH abuse. Had LINQ placed 2016.   Was admitted 4/20 with hematochezia and melena. His hemoglobin remained stable. No episodes of GI bleed after hospitalization. Underwent EGD by GI with finding of Grade 1 and a small esophageal varices, no evidence of recent bleeding,portal hypertensive gastropathy. Started on nadolol which made him very dizzy so it was stopped.   Admitted 01/20/20 with increased shortness of breath. Diuresed with IV lasix and later transitioned to torsemide 60 mg twice a day. Hospital course complicated by AKI. Discharged on 01/26/20 with weight 199 pounds.   Readmitted 01/30/20 [facility] ED with increased dyspnea and leg edema. Weight has gone up 20 pounds since discharge.Says he has been taking all medications. Admitted withcellulitis and A/C RV failure. Diuresed with IV lasix and transitioned to torsemide.  Discharge weight 184 pounds.   He returned to the HF clinic on 02/23/20. His daughter said he was missing some of his medications. He was volume overloaded so he was referred to [facility]. He was seen by [place] on 02/23/20. Given IV lasix 4/22 and 4/23. Says his weight went down 6 pounds.   On 02/25/20 his daughter said he was disoriented and he didn't know his name and was unable to stand. He was brought to the ED via EMS to evaluate AMS. CXR Possible pulmonary vascular congestion. CT of head was negative. Pertinent admission labs included: ammonia 112, ethanol < 10, BNP 95, creatinine 2.9, and WBC 2.7, and Hgb 10.6 .   Admitted by [facility] and started lactulose. Weakness and mental status improved  but he remains weak. Denies SOB. Denies dizziness/syncope. Palliative Care consulted and he is a DNR. OT/PT following with recommendations for CIR.   Echo 05/17/19: LVEF >65% RV severely dilated with moderate RV dysfunction. Moderate TR RVSP 46mHG.   Review of Systems: [y] = yes, _0  = no   . General: Weight gain _1 ; Weight loss _2 ; Anorexia _3 ; Fatigue [ Y]; Fever _4 ; Chills _5 ; Weakness [Y ]  . Cardiac: Chest pain/pressure _6 ; Resting SOB _7 ; Exertional SOB _8 ; Orthopnea _9 ; Pedal Edema _10 ; Palpitations _11 ; Syncope _12 ; Presyncope _13 ; Paroxysmal nocturnal dyspnea_14   . Pulmonary: Cough _15 ; Wheezing_16 ; Hemoptysis_17 ; Sputum _18 ; Snoring _19   . GI: Vomiting_20 ; Dysphagia_21 ; Melena_22 ; Hematochezia _23 ; Heartburn_24 ; Abdominal pain _25 ; Constipation _26 ; Diarrhea _27 ; BRBPR _28   . GU: Hematuria_29 ; Dysuria _30 ; Nocturia_31   . Vascular: Pain in legs with walking _32 ; Pain in feet with lying flat _33 ; Non-healing sores _34 ; Stroke _35 ; TIA _36 ; Slurred speech _37 ;  . Neuro: Headaches_38 ; Vertigo_39 ; Seizures_40 ; Paresthesias_41 ;Blurred vision _42 ; Diplopia _43 ; Vision changes _44   . Ortho/Skin: Arthritis _45 ; Joint pain [ Y]; Muscle pain _46 ; Joint swelling _47 ; Back Pain [Y ]; Rash _48   . Psych: Depression_49 ; Anxiety_50   . Heme: Bleeding problems _51 ; Clotting disorders _52 ; Anemia _53   .  Endocrine: Diabetes _0 ; Thyroid dysfunction_1   Home Medications Prior to Admission medications   Medication Sig Start Date End Date Taking? Authorizing Provider  acetaminophen (TYLENOL) 500 MG tablet Take 1,000 mg by mouth every 6 (six) hours as needed (for pain.).   Yes [provider]  allopurinol (ZYLOPRIM) 100 MG tablet Take 2 tablets (200 mg total) by mouth daily. 07/12/19  Yes Mosie Lukes, MD  diphenoxylate-atropine (LOMOTIL) 2.5-0.025 MG tablet Take 1 tablet by mouth 4 (four) times daily as needed for diarrhea or loose stools. 12/01/19  Yes Mosie Lukes, MD  Ferrous  Fumarate (HEMOCYTE) 324 (106 Fe) MG TABS tablet Take 1 tablet (106 mg of iron total) by mouth daily. 05/05/19  Yes Mosie Lukes, MD  folic acid (FOLVITE) 1 MG tablet Take 1 tablet (1 mg total) by mouth daily. 01/11/20  Yes Saguier, Percell Miller, PA-C  hyoscyamine (LEVSIN SL) 0.125 MG SL tablet Place 1 tablet (0.125 mg total) under the tongue every 6 (six) hours as needed. Patient taking differently: Place 0.125 mg under the tongue every 6 (six) hours as needed for cramping.  08/23/19  Yes Mosie Lukes, MD  macitentan (OPSUMIT) 10 MG tablet Take 1 tablet (10 mg total) by mouth daily. 09/26/19  Yes Serjio Deupree, Shaune Pascal, MD  Multiple Vitamin (MULTIVITAMIN WITH MINERALS) TABS tablet Take 1 tablet by mouth daily.   Yes [provider]  omeprazole (PRILOSEC) 40 MG capsule TAKE 1 CAPSULE BY MOUTH ONCE DAILY Patient taking differently: Take 40 mg by mouth daily.  11/14/19  Yes Saguier, Percell Miller, PA-C  Potassium Chloride ER 20 MEQ TBCR Take 40 mEq by mouth 2 (two) times daily. 02/05/20  Yes Domenic Polite, MD  rosuvastatin (CRESTOR) 5 MG tablet TAKE 1 TABLET DAILY Patient taking differently: Take 5 mg by mouth daily at 6 PM.  08/31/19  Yes Mosie Lukes, MD  Selexipag (UPTRAVI) 1000 MCG TABS Take 1,000 mcg by mouth 2 (two) times daily. 06/03/19  Yes Yarden Manuelito, Shaune Pascal, MD  SUMAtriptan (IMITREX) 50 MG tablet Take 1 tablet (50 mg total) by mouth daily as needed for migraine or headache. 02/09/20 03/10/20 Yes Clegg, Amy D, NP  tadalafil, PAH, (ADCIRCA) 20 MG tablet Take 2 tablets (40 mg total) by mouth daily. 11/18/19  Yes Amaro Mangold, Shaune Pascal, MD  thiamine 100 MG tablet Take 1 tablet (100 mg total) by mouth daily. 05/05/19  Yes Mosie Lukes, MD  torsemide (DEMADEX) 20 MG tablet Take 3 tablets (60 mg total) by mouth 2 (two) times daily. Patient taking differently: Take 80 mg by mouth 2 (two) times daily.  01/28/20  Yes Mercy Riding, MD  Blood Pressure Monitoring (BLOOD PRESSURE CUFF) MISC Use as directed. Check  blood pressure bid prn Dx:I10 02/22/19   Mosie Lukes, MD    Past Medical History: Past Medical History:  Diagnosis Date  . Alcohol dependence in remission ([place]) 03/22/2011   In remission   . Alcoholic cirrhosis of liver with ascites ([place]) 2014  . Anginal pain ([place])    with SOB admitted 04/20/2019  . Cardiomegaly 02/2019   Noted on CXR  . CHF (congestive heart failure) ([place])   . CKD (chronic kidney disease), stage III 07/18/2013  . COPD (chronic obstructive pulmonary disease) ([place])   . Coronary artery disease   . GERD (gastroesophageal reflux disease)   . Gout   . Hearing loss   . Hypertension   . Hypertriglyceridemia 03/17/2017  . PAH (pulmonary artery hypertension) ([place])  RV failure  . Pancytopenia ([place])   . Pleural effusion 2014   Noted on CT:   Moderately large simple layering right pleural effusion   . Pulmonary nodule 2014   Noted on CT: An 8 mm pulmonary nodule in the periphery of the left lower lobe  . Rectal bleeding   . Seizures ([place])   . Sleep apnea    does not wear CPAP  . Splenomegaly 12/2015   Noted on CT  . Thoracic aorta atherosclerosis ([place]) 12/2017  . Thrombocytopenia ([place]) 06/29/2016  . Unspecified hypothyroidism 01/25/2013  . Vertigo     Past Surgical History: Past Surgical History:  Procedure Laterality Date  . CARDIAC CATHETERIZATION N/A 12/21/2015   Procedure: Right Heart Cath;  Surgeon: Jolaine Artist, MD;  Location: [place] [facility];  Service: Cardiovascular;  Laterality: N/A;  . CARDIAC CATHETERIZATION N/A 04/15/2016   Procedure: Right/Left Heart Cath and Coronary Angiography;  Surgeon: Jolaine Artist, MD;  Location: [place] [facility];  Service: Cardiovascular;  Laterality: N/A;  . COLONOSCOPY N/A 12/08/2013   Procedure: COLONOSCOPY;  Surgeon: Milus Banister, MD;  Location: [facility];  Service: Endoscopy;  Laterality: N/A;  . COLONOSCOPY WITH PROPOFOL N/A 03/24/2019   Procedure: COLONOSCOPY WITH PROPOFOL;  Surgeon: Milus Banister,  MD;  Location: [facility];  Service: Endoscopy;  Laterality: N/A;  Draw CBC in preop  . COLONOSCOPY WITH PROPOFOL N/A 04/28/2019   Procedure: COLONOSCOPY WITH PROPOFOL;  Surgeon: Milus Banister, MD;  Location: [facility];  Service: Endoscopy;  Laterality: N/A;  . ESOPHAGOGASTRODUODENOSCOPY (EGD) WITH PROPOFOL N/A 03/27/2016   Procedure: ESOPHAGOGASTRODUODENOSCOPY (EGD) WITH PROPOFOL;  Surgeon: Milus Banister, MD;  Location: [place];  Service: Endoscopy;  Laterality: N/A;  . ESOPHAGOGASTRODUODENOSCOPY (EGD) WITH PROPOFOL N/A 02/14/2019   Procedure: ESOPHAGOGASTRODUODENOSCOPY (EGD) WITH PROPOFOL;  Surgeon: Jerene Bears, MD;  Location: [facility] ENDOSCOPY;  Service: Gastroenterology;  Laterality: N/A;  . EYE SURGERY Bilateral 03/15/2019   lasix  . GAS/FLUID EXCHANGE Right 09/22/2019   Procedure: GAS/FLUID EXCHANGE (C3F8) RIGHT EYE;  Surgeon: Sherlynn Stalls, MD;  Location: [place];  Service: Ophthalmology;  Laterality: Right;  . HEMOSTASIS CLIP PLACEMENT  04/28/2019   Procedure: HEMOSTASIS CLIP PLACEMENT;  Surgeon: Milus Banister, MD;  Location: [facility];  Service: Endoscopy;;  . LOOP RECORDER INSERTION N/A 01/01/2017   Procedure: Loop Recorder Insertion;  Surgeon: Thompson Grayer, MD;  Location: [place] [facility];  Service: Cardiovascular;  Laterality: N/A;  . PARS PLANA VITRECTOMY Right 09/22/2019   Procedure: PARS PLANA VITRECTOMY WITH 25 GAUGE RIGHT EYE;  Surgeon: Sherlynn Stalls, MD;  Location: [place];  Service: Ophthalmology;  Laterality: Right;  . PHOTOCOAGULATION WITH LASER Right 09/22/2019   Procedure: PHOTOCOAGULATION WITH LASER RIGHT EYE;  Surgeon: Sherlynn Stalls, MD;  Location: [place];  Service: Ophthalmology;  Laterality: Right;  . POLYPECTOMY  04/28/2019   Procedure: POLYPECTOMY;  Surgeon: Milus Banister, MD;  Location: [facility];  Service: Endoscopy;;  . RIGHT HEART CATHETERIZATION Right 06/06/2014   Procedure: RIGHT HEART CATH;  Surgeon: Jolaine Artist, MD;  Location: [facility] CATH  LAB;  Service: Cardiovascular;  Laterality: Right;  . UVULOPALATOPHARYNGOPLASTY  1999    Family History: Family History  Problem Relation Age of Onset  . Heart disease Mother   . Heart attack Mother   . Hypertension Mother   . Kidney failure Mother   . Liver disease Mother        stage 4 liver disease  . Prostate cancer Father   .  Cancer Father        lung  . Hypertension Brother   . Gout Brother   . Alcoholism Maternal Grandfather   . Liver disease Maternal Grandfather   . Migraines Sister   . Hypertension Brother   . Colon cancer Neg Hx     Social History: Social History   Socioeconomic History  . Marital status: Divorced    Spouse name: Not on file  . Number of children: 3  . Years of education: Not on file  . Highest education level: Not on file  Occupational History  . Occupation: Retired    Comment: Social research officer, government  Tobacco Use  . Smoking status: Former Smoker    Packs/day: 2.00    Years: 5.00    Pack years: 10.00    Types: Cigarettes    Quit date: 09/22/1979    Years since quitting: 40.4  . Smokeless tobacco: Never Used  Substance and Sexual Activity  . Alcohol use: Yes    Alcohol/week: 6.0 standard drinks    Types: 6 Shots of liquor per week    Comment: quit drinking in 12/2019  . Drug use: No  . Sexual activity: Yes    Birth control/protection: Spermicide  Other Topics Concern  . Not on file  Social History Narrative   0 caffeine drinks daily    Social Determinants of Health   Financial Resource Strain:   . Difficulty of Paying Living Expenses:   Food Insecurity:   . Worried About Charity fundraiser in the Last Year:   . Arboriculturist in the Last Year:   Transportation Needs:   . Film/video editor (Medical):   Marland Kitchen Lack of Transportation (Non-Medical):   Physical Activity:   . Days of Exercise per Week:   . Minutes of Exercise per Session:   Stress:   . Feeling of Stress :   Social Connections:   . Frequency of Communication with  Friends and Family:   . Frequency of Social Gatherings with Friends and Family:   . Attends Religious Services:   . Active Member of Clubs or Organizations:   . Attends Archivist Meetings:   Marland Kitchen Marital Status:     Allergies:  Allergies  Allergen Reactions  . Aspirin Hypertension  . Nitroglycerin Other (See Comments)    Severe Headache    Objective:    Vital Signs:   Temp:  [98 F (36.7 C)] 98 F (36.7 C) (04/26 0452) Pulse Rate:  [82-91] 87 (04/26 1119) Resp:  [18] 18 (04/26 0452) BP: (110-123)/(60-76) 114/72 (04/26 0813) SpO2:  [94 %-98 %] 98 % (04/26 1119) Last BM Date: 02/27/20  Weight change: Filed Weights   02/25/20 1749  Weight: 87.1 kg    Intake/Output:   Intake/Output Summary (Last 24 hours) at 02/27/2020 1439 Last data filed at 02/27/2020 1259 Gross per 24 hour  Intake 750 ml  Output 1650 ml  Net -900 ml      Physical Exam    General:  No resp difficulty HEENT: normal Neck: supple. JVP 5-6  . Carotids 2+ bilat; no bruits. No lymphadenopathy or thyromegaly appreciated. Cor: PMI nondisplaced. Regular rate & rhythm. No rubs, gallops or murmurs. Lungs: clear Abdomen: soft, nontender, nondistended. No hepatosplenomegaly. No bruits or masses. Good bowel sounds. Extremities: no cyanosis, clubbing, rash,  R and LLE trace edema Neuro: alert & orientedx3, cranial nerves grossly intact. moves all 4 extremities w/o difficulty. Affect flat    Telemetry   Not  on telemetry   EKG     SR 85 bpm   Labs   Basic Metabolic Panel: Recent Labs  Lab 02/23/20 1120 02/23/20 1120 02/25/20 1144 02/26/20 0108 02/27/20 0847  NA 135  --  138 138 139  K 3.8  --  3.7 3.6 4.2  CL 101  --  104 105 107  CO2 21*  --  21* 21* 22  GLUCOSE 104*  --  127* 111* 137*  BUN 42*  --  48* 46* 56*  CREATININE 3.05*  --  2.94* 2.50* 2.68*  CALCIUM 9.0   < > 9.3 9.3 9.0   < > = values in this interval not displayed.    Liver Function Tests: Recent Labs  Lab  02/25/20 1144 02/26/20 0108  AST 83* 92*  ALT 36 39  ALKPHOS 271* 264*  BILITOT 2.7* 2.6*  PROT 6.8 6.7  ALBUMIN 3.1* 3.0*   No results for input(s): LIPASE, AMYLASE in the last 168 hours. Recent Labs  Lab 02/25/20 1144 02/26/20 0108  AMMONIA 112* 80*    CBC: Recent Labs  Lab 02/23/20 1120 02/25/20 1144 02/26/20 0108 02/27/20 0847  WBC 2.6* 2.7* 3.0* 3.4*  NEUTROABS  --  1.7  --   --   HGB 10.4* 10.6* 10.5* 10.0*  HCT 33.1* 34.3* 32.5* 32.5*  MCV 103.4* 106.2* 101.2* 105.5*  PLT 28* 26* 27* 33*    Cardiac Enzymes: No results for input(s): CKTOTAL, CKMB, CKMBINDEX, TROPONINI in the last 168 hours.  BNP: BNP (last 3 results) Recent Labs    01/30/20 1605 02/23/20 1120 02/25/20 1144  BNP >4,500.0* 131.2* 95.9    ProBNP (last 3 results) Recent Labs    10/11/19 1138 12/08/19 1041 01/11/20 1439  PROBNP 604.0* 710.0* 494.0*     CBG: Recent Labs  Lab 02/25/20 1129  GLUCAP 107*    Coagulation Studies: Recent Labs    02/25/20 1144 02/26/20 0108  LABPROT 15.4* 15.0  INR 1.2 1.2     Imaging    No results found.   Medications:     Current Medications: . sodium chloride   Intravenous Once  . allopurinol  200 mg Oral Daily  . Ferrous Fumarate  1 tablet Oral Daily  . folic acid  1 mg Oral Daily  . lactulose  20 g Oral BID  . macitentan  10 mg Oral Daily  . multivitamin with minerals  1 tablet Oral Daily  . pantoprazole  40 mg Oral Daily  . potassium chloride SA  40 mEq Oral BID  . rosuvastatin  5 mg Oral q1800  . Selexipag  1,000 mcg Oral BID  . tadalafil (PAH)  40 mg Oral Daily  . thiamine  100 mg Oral Daily  . torsemide  60 mg Oral BID     Infusions:     Assessment/Plan   1. AMS On admit CT of head negative.  - Ammonia 112>80 - On lacutulose - Mental status improving.   2. PAH  -ECHO 01/2020 EF 60-65% RVSP 83 Grade II DD -On triple therapy- selexipag 1000 mcg twice a day. Intolerant higher dose due to diarrhea and  headaches . - Continue macitentan 10 daily and adcirca 40 mg daily.  3. Chronic Diastolic Heart Failure From HF perspective he is stable. He has had 2 admits in March for A/C Diastolic Heart Failure.  - Volume status stable. Continue torsemide 60 mg twice a day.  - Renal function stable.   4. CKD Stage IV Creatinine baseline  2.5-2.8  Renal function stable.   5. OSA  Intolerant CPAP   6. Pancytopenia  Platelets 33 and WBC 3.4  Has been followed by Dr Alen Blew  7. Elevated Bilirubin  Total Bilirubin 2.6   8. Cirrhosis, [place]  [place] Abd 01/23/20 with cirrhosis  9. Deconditioning  PT/OT following with recommendations for CIR.   10. Palliative Care- Goals of  Care . Now DNR  He will need close follow up. Hopefully he can go to CIR for rehab.   Length of Stay: 1  Amy Clegg, NP  02/27/2020, 2:39 PM  Advanced Heart Failure Team Pager [phone] (M-F; 7a - 4p)  Please contact [facility] for night-coverage after hours (4p -7a ) and weekends on [email or web address]  Patient seen and examined with the above-signed Advanced Practice Provider and/or Housestaff. I personally reviewed laboratory data, imaging studies and relevant notes. I independently examined the patient and formulated the important aspects of the plan. I have edited the note to reflect any of my changes or salient points. I have personally discussed the plan with the patient and/or family.  69 y/o male with h/o ETOH cirrhosis, porto-pulmonary HTN, CKD 3b and pancytopenia well known to me from [facility].   Over past few months has had severe downward trajectory with volume overload and multiple admissions. Admitted over the weekend with new onset hepatic encephalopathy which has responded very well to lactulose therapy.   Volume status looks as good as I have seen it in quite some time and renal function stable.   General:  Sitting up in bed No resp difficulty HEENT: normal Neck: supple. JVP 7-8 Carotids 2+ bilat; no bruits.  No lymphadenopathy or thryomegaly appreciated. Cor: PMI nondisplaced. Regular rate & rhythm. Loud P2 2/6 TR Lungs: clear Abdomen: soft, nontender, nondistended. No hepatosplenomegaly. No bruits or masses. Good bowel sounds. Extremities: no cyanosis, clubbing, rash, edema Neuro: alert & orientedx3, cranial nerves grossly intact. moves all 4 extremities w/o difficulty. Affect pleasant Mild asterixis  He has progressive MSOF with new-onset hepatic encephalopathy but is now actually about as well-tuned as I have seen him in a long time with support from his children.   We had a long discussion about his prognosis and next steps. He is clearly not candidate for liver transplantation given his multiple co-morbidities. We also discussed need for very close attention to his medication regimen and the fact that he has not been able to manage this well by himself. Finally, we discussed code status and explained the difference between being no code blue with aggressive medical therapy versus comfort care only.   I do not think he would do well with resuscitation post-arrest but I do think it is reasonable to continue aggressive medical care at this point.   I will change his code status back to No CPR/intubation/defibrillation but continue all other aggressive treatments. We will have our pharmacist review his meds carefully with his family tomorrow prior to d/c.   Greatly appreciate [facility].   Glori Bickers, MD  7:37 PM

## 2020-02-27 NOTE — Progress Notes (Signed)
Writer observed doctor in with pt and son. Doctor stated that DNR was fully explained to pt and son; they are now wanting DNR status with full meds just no cpr or intubation if pt goes into cardiac arrest.

## 2020-02-27 NOTE — Progress Notes (Deleted)
Pt missed visit today, due to recent hospitalization. Spoke with pt on the phone and he confirmed he is in the hospital, and he will have his daughter call back in regards to future visits/any potential reschedules.

## 2020-02-27 NOTE — Progress Notes (Signed)
Daily Progress Note   Patient Name: Darren Mueller       Date: 02/27/2020 DOB: 12/01/1950  Age: 69 y.o. MRN#: [id number] Attending Physician: Geradine Girt, DO Primary Care Physician: Mosie Lukes, MD Admit Date: 02/25/2020  Reason for Consultation/Follow-up: Establishing goals of care  Subjective: Patient awake, alert, oriented and able to participate in discussion. Denies pain or discomfort. Walked in the halls today.  GOC:  Daughter, Darren Mueller at bedside. PMT provider, Florentina Jenny spent time with daughter and son yesterday. See consult note.  Introduced role of palliative medicine to patient.   Discussed events leading up to admission and course of hospitalization including diagnoses, interventions, plan of care. Discussed in detail multi-organ failure and multiple chronic conditions leading to overall health decline and challenges moving forward (balancing act with liver, heart, lung, and kidney involvement). Reviewed recommendations from specialists. Discussed importance with compliance with medications and outpatient follow-ups.   Patient has a documented living will. Not on file. Requested from daughter.   Introduced and discussed MOST form, explaining ongoing recommendation for DNR/DNI with chronic, irreversible conditions. Discussed medical management and treating treatable conditions (IVF/ABX if indicated). Patient plans to further review and discuss with his children.   Patient and daughter are waiting on CIR evaluation and hopeful he will be approved.   Pending ultrasound this afternoon.   Answered questions and concerns. PMT contact information given to [facility].    Length of Stay: 1  Current Medications: Scheduled Meds:  . sodium chloride   Intravenous Once    . allopurinol  200 mg Oral Daily  . Ferrous Fumarate  1 tablet Oral Daily  . folic acid  1 mg Oral Daily  . lactulose  20 g Oral BID  . macitentan  10 mg Oral Daily  . multivitamin with minerals  1 tablet Oral Daily  . pantoprazole  40 mg Oral Daily  . potassium chloride SA  40 mEq Oral BID  . rosuvastatin  5 mg Oral q1800  . Selexipag  1,000 mcg Oral BID  . tadalafil (PAH)  40 mg Oral Daily  . thiamine  100 mg Oral Daily  . torsemide  60 mg Oral BID    Continuous Infusions:   PRN Meds: hydrALAZINE, HYDROcodone-acetaminophen, morphine injection, ondansetron (ZOFRAN) IV, oxymetazoline  Physical Exam Vitals and nursing note  reviewed.  Constitutional:      General: He is awake.  HENT:     Head: Normocephalic and atraumatic.  Pulmonary:     Effort: No tachypnea, accessory muscle usage or respiratory distress.  Skin:    General: Skin is warm and dry.  Neurological:     Mental Status: He is alert and oriented to person, place, and time.            Vital Signs: BP 114/72 (BP Location: Left Arm)   Pulse 87 Comment: during ambulation  Temp 98 F (36.7 C) (Oral)   Resp 18   Ht _0  (1.854 m)   Wt 87.1 kg   SpO2 98%   BMI 25.33 kg/m  SpO2: SpO2: 98 % O2 Device: O2 Device: Room Air O2 Flow Rate:    Intake/output summary:   Intake/Output Summary (Last 24 hours) at 02/27/2020 1125 Last data filed at 02/27/2020 1000 Gross per 24 hour  Intake 390 ml  Output 1650 ml  Net -1260 ml   LBM: Last BM Date: 02/27/20 Baseline Weight: Weight: 87.1 kg Most recent weight: Weight: 87.1 kg       Palliative Assessment/Data: PPS 50%      Patient Active Problem List   Diagnosis Date Noted  . Goals of care, counseling/discussion   . DNR (do not resuscitate)   . Hepatic encephalopathy ([place]) 02/25/2020  . Neck pain 02/10/2020  . Anorexia 02/10/2020  . AKI (acute kidney injury) ([place])   . Cellulitis of right lower extremity   . Acute exacerbation of CHF (congestive heart  failure) ([place]) 01/30/2020  . CHF (congestive heart failure) ([place]) 01/20/2020  . Abdominal pain 08/23/2019  . Grief 08/23/2019  . Acute on chronic diastolic heart failure ([place]) 05/16/2019  . Chronic diastolic CHF (congestive heart failure) ([place]) 05/16/2019  . Calf pain 04/03/2019  . History of colonic polyps   . Polyp of cecum   . Headache 02/21/2019  . Rectal bleeding 02/13/2019  . COPD (chronic obstructive pulmonary disease) ([place]) 02/13/2019  . Acute on chronic right heart failure ([place]) 02/13/2019  . Hematochezia   . Ascites   . Dark stools   . Alcoholism ([place])   . Hyperglycemia 05/04/2018  . Seizure disorder ([place]) 02/01/2018  . Pedal edema 10/01/2017  . RUQ pain 05/06/2017  . Decreased range of motion of left elbow 04/15/2017  . Hyperlipidemia 03/17/2017  . Elbow pain, left 03/17/2017  . Right rib fracture   . Syncope 07/19/2016  . Thrombocytopenia ([place]) 06/29/2016  . Hoarseness, chronic 06/29/2016  . Preventative health care 06/29/2016  . Chest pain   . Low back pain 12/09/2015  . Cirrhosis with alcoholism ([place]) 08/08/2015  . Pancytopenia ([place]) 06/10/2015  . Dermatitis 06/10/2015  . OSA (obstructive sleep apnea) 02/13/2015  . Gout 07/30/2014  . Diarrhea 09/04/2013  . Stage 4 chronic kidney disease ([place]) 07/18/2013  . Right heart failure due to pulmonary hypertension ([place]) 06/06/2013  . Hypothyroidism 01/25/2013  . PAH (pulmonary arterial hypertension) with portal hypertension ([place]) 01/21/2013  . Allergic rhinitis 12/21/2012  . Secondary pulmonary hypertension 12/09/2012  . Hepatic cirrhosis ([place]) 11/26/2012  . GERD (gastroesophageal reflux disease) 09/05/2011  . Hypokalemia 08/29/2011  . Colon polyps 03/26/2011  . Insomnia 03/26/2011  . Hypertension 03/22/2011  . Alcohol dependence ([place]) 03/22/2011    Palliative Care Assessment & Plan   Patient Profile: 69 y.o. male  with past medical history of COPD, grade 2 diastolic dysfunction with severe pulmonary  arterial HTN who was admitted  on 02/25/2020 with hepatic encephalopathy.  Ammonia level 112, creatinine 3.0, and platelets in the 20,000s.  Overnight on the 24th he had significant epistaxis and required a unit of platelets.  His mental status has improved with treatment.  Of note this is he 3rd hospitalization on short succession.  Assessment: Hepatic encephalopathy Cirrhosis Pulmonary hypertension COPD Stage IV CKD Chronic diastolic CHF Hx of ETOH abuse Seizures  Recommendations/Plan:  Continue current plan of care and medical management.   Patient has documented living will. Requested copy from family.   Introduced and discussed MOST form. Patient and family to further discuss and contact PMT provider if ready to complete this admission.   Pending CIR evaluation.    Outpatient f/u with cardiology, nephrology, and GI.    May benefit from outpatient palliative referral for ongoing support.   Code Status: DNR   Code Status Orders  (From admission, onward)         Start     Ordered   02/26/20 1532  Do not attempt resuscitation (DNR)  Continuous    Question Answer Comment  In the event of cardiac or respiratory ARREST Do not call a "code blue"   In the event of cardiac or respiratory ARREST Do not perform Intubation, CPR, defibrillation or ACLS   In the event of cardiac or respiratory ARREST Use medication by any route, position, wound care, and other measures to relive pain and suffering. May use oxygen, suction and manual treatment of airway obstruction as needed for comfort.      02/26/20 1531        Code Status History    Date Active Date Inactive Code Status Order ID Comments User Context   02/25/2020 1424 02/26/2020 1531 Full Code [id number]  Karmen Bongo, MD ED   01/30/2020 2016 02/05/2020 1925 Full Code [id number]  Oswald Hillock, MD ED   01/20/2020 2026 01/26/2020 2214 Full Code [id number]  Clance Boll, MD ED   05/16/2019 1537 05/19/2019 1718 Full Code [id number]   Bensimhon, Shaune Pascal, MD Inpatient   02/13/2019 0215 02/14/2019 1849 Full Code [id number]  Vianne Bulls, MD ED   05/06/2017 1806 05/08/2017 1357 Full Code [id number]  Fay Records, MD Inpatient   12/31/2016 1422 01/02/2017 1634 Full Code [id number]  Shirley Friar, PA-C Inpatient   07/19/2016 0735 07/20/2016 1806 Full Code [id number]  Edwin Dada, MD ED   06/04/2016 1607 06/06/2016 1609 Full Code [id number]  Willia Craze, NP Inpatient   04/14/2016 0504 04/16/2016 1537 Full Code [id number]  Tomma Rakers, MD ED   06/06/2014 1518 06/06/2014 2041 Full Code [id number]  Bensimhon, Shaune Pascal, MD Inpatient   10/18/2013 0136 10/19/2013 1912 Full Code [id number]  Larey Dresser, MD Inpatient   Advance Care Planning Activity    Advance Directive Documentation     Most Recent Value  Type of Advance Directive  Healthcare Power of Attorney, Living will  Pre-existing out of facility DNR order (yellow form or pink MOST form)  --  "MOST" Form in Place?  --       Prognosis:  Poor long-term prognosis  Discharge Planning:  To Be Determined: pending CIR evaluation  Care plan was discussed with patient, daughter Darren Mueller), RN, Dr Eliseo Squires  Thank you for allowing the Palliative Medicine Team to assist in the care of this patient.   Time In: 1115- 1215- Time Out: 8889 [id number] Total Time 40 Prolonged Time Billed   no      Greater than  50%  of this time was spent counseling and coordinating care related to the above assessment and plan.  Ihor Dow, DNP, FNP-C Palliative Medicine Team  Phone: [phone] Fax: [phone]  Please contact Palliative Medicine Team phone at [phone] for questions and concerns.

## 2020-02-27 NOTE — Progress Notes (Signed)
Nurse informed me that patient and daughter want him to be a full code.  Will update chart.  Notified palliative care as an FYI JV

## 2020-02-27 NOTE — Progress Notes (Signed)
Progress Note    Darren Mueller  KOE:[id number] DOB: 01/17/1951  DOA: 02/25/2020 PCP: Mosie Lukes, MD    Brief Narrative:     Medical records reviewed and are as summarized below:  Darren Mueller is an 69 y.o. male with medical history significant of hypothyroidism; OSA not on CPAP; seizures; HLD; HTN; CAD; COPD; stage 3 CKD; alcoholic cirrhosis with ascites; h/o ETOH dependence; and chronic diastolic CHF presenting with AMS.  His daughter reports that he has been very confused, disoriented, weaker than normal.  Yesterday, he walked with OT but today he was slumped over the table and unable to stand on his own.  Also not oriented even to person in the ER.  He has never had this issue before and does not take lactulose at home.  He was perfectly himself yesterday.  He does have known alcoholic liver disease with h/o ascites.  No ETOH in the last several months.  Assessment/Plan:   Principal Problem:   Hepatic encephalopathy (HCC) Active Problems:   Hypertension   Alcohol dependence ([place])   PAH (pulmonary arterial hypertension) with portal hypertension (HCC)   Stage 4 chronic kidney disease (HCC)   OSA (obstructive sleep apnea)   Pancytopenia (HCC)   Cirrhosis with alcoholism (HCC)   Hyperlipidemia   Seizure disorder (HCC)   COPD (chronic obstructive pulmonary disease) (HCC)   Chronic diastolic CHF (congestive heart failure) (HCC)   Goals of care, counseling/discussion   DNR (do not resuscitate)   Hepatic encephalopathy -Patient with known alcoholic cirrhosis which has been decompensated with ascites (none present currently on physical exam but will get limited ultrasound to rule out SBP) -Presenting with acute onset of AMS -Ammonia 112 elevated initially but has since been trending down -He does not appear to have new electrolyte abnormalities, worsening renal failure, hypoglycemia, or other apparent triggers -again we will get limited ultrasound for ascites and  order paracentesis if fluid -Needs lactulose titrated to 2-3 soft stools/day -PT/OT consults -currently recommending CIR  Cirrhosis -MELD/MELD-Na score is 23/24, with a mortality rate of 19.6% -He is not a candidate for liver transplant, as he stopped drinking only 2 months ago -Continue diuretics -Patient follows with Dr. Ardis Hughs: We will defer  addition of BB and also Rifaximin (unless he continues to be encephalopathic with proper lactulose dose)  Pulmonary HTN/COPD -Continue home meds - Opsumit, Selexipag, Tadalafil -He appears to be compensated from this standpoint at this time -Of note, all 3 of these medications can cause liver dysfunction or need dose adjustment in the setting of liver failure; outpatient discussion with his pulmonologist for ongoing discussion will be important in trying to balance his advanced pulmonary needs vs. His advancing hepatic issues  Pancytopenia -Appears to be stable -Avoid heparin, NSAIDs -Continue Hemocyte  Stage 4 CKD -Advanced CKD, followed by Dr. Marval Regal -Appears to be stable at this time: Await a.m. labs  History of alcohol abuse -Patient states he has not used for 2 months -Alcohol level here negative -No signs of withdrawal  HLD -Continue Crestor  Chronic diastolic CHF -03/07/17 echo with presented EF, moderate LVH, and grade 2 diastolic dysfunction -Marked LE edema but his daughter reports that this is improved from prior -Renal function is stable despite recent diuresis -Patient reports that plan was to increase torsemide dose from 60 BID to 80 BID; however last cardiology note indicates plan for 60 mg PO BID -For now, will continue 60 mg PO BID and continue to follow  Seizures -  Followed by Dr. Delice Lesch -Continue Keppra  OSA -Not on CPAP   Patient has failure of multiple organ systems including heart/lungs/liver/kidneys.  Appreciate palliative care consultation and introduction of their role   Family  Communication/Anticipated D/C date and plan/Code Status   DVT prophylaxis: SCDs Code Status: DNR Family Communication: Daughter at bedside Disposition Plan: Status is: Inpatient Status is: Inpatient  Remains inpatient appropriate because: Needs continued work-up   Dispo: The patient is from: Home              Anticipated d/c is to: CIR vs SNF              Anticipated d/c date is: 1 day              Patient currently is not medically stable to d/c.    Medical Consultants:    Palliative care     Subjective:   Planing of headache this a.m., no further nosebleeds  Objective:    Vitals:   02/26/20 1833 02/26/20 2331 02/27/20 0452 02/27/20 0813  BP: 113/60 123/70 110/76 114/72  Pulse: 85 91 84 82  Resp: _0 Temp:  98 F (36.7 C) 98 F (36.7 C)   TempSrc: Axillary Oral Oral   SpO2: 98% 97% 94%   Weight:      Height:        Intake/Output Summary (Last 24 hours) at 02/27/2020 0934 Last data filed at 02/27/2020 6759 Gross per 24 hour  Intake 150 ml  Output 1500 ml  Net -1350 ml   Filed Weights   02/25/20 1749  Weight: 87.1 kg    Exam:  General: Appearance:     Well developed, well nourished male in no acute distress     Lungs:     Clear anteriorly, no increased work of breathing  Heart:    Normal heart rate. Normal rhythm. No murmurs, rubs, or gallops.   MS:   All extremities are intact.  Minimal lower extremity edema, SCDs present  Neurologic:   Awake, alert, oriented x 3. No apparent focal neurological           defect.     Data Reviewed:   I have personally reviewed following labs and imaging studies:  Labs: Labs show the following:   Basic Metabolic Panel: Recent Labs  Lab 02/23/20 1120 02/23/20 1120 02/25/20 1144 02/26/20 0108  NA 135  --  138 138  K 3.8   < > 3.7 3.6  CL 101  --  104 105  CO2 21*  --  21* 21*  GLUCOSE 104*  --  127* 111*  BUN 42*  --  48* 46*  CREATININE 3.05*  --  2.94* 2.50*  CALCIUM 9.0  --  9.3 9.3   < >  = values in this interval not displayed.   GFR Estimated Creatinine Clearance: 31.1 mL/min (A) (by C-G formula based on SCr of 2.5 mg/dL (H)). Liver Function Tests: Recent Labs  Lab 02/25/20 1144 02/26/20 0108  AST 83* 92*  ALT 36 39  ALKPHOS 271* 264*  BILITOT 2.7* 2.6*  PROT 6.8 6.7  ALBUMIN 3.1* 3.0*   No results for input(s): LIPASE, AMYLASE in the last 168 hours. Recent Labs  Lab 02/25/20 1144 02/26/20 0108  AMMONIA 112* 80*   Coagulation profile Recent Labs  Lab 02/25/20 1144 02/26/20 0108  INR 1.2 1.2    CBC: Recent Labs  Lab 02/23/20 1120 02/25/20 1144 02/26/20 0108 02/27/20 0847  WBC  2.6* 2.7* 3.0* 3.4*  NEUTROABS  --  1.7  --   --   HGB 10.4* 10.6* 10.5* 10.0*  HCT 33.1* 34.3* 32.5* 32.5*  MCV 103.4* 106.2* 101.2* 105.5*  PLT 28* 26* 27* 33*   Cardiac Enzymes: No results for input(s): CKTOTAL, CKMB, CKMBINDEX, TROPONINI in the last 168 hours. BNP (last 3 results) Recent Labs    10/11/19 1138 12/08/19 1041 01/11/20 1439  PROBNP 604.0* 710.0* 494.0*   CBG: Recent Labs  Lab 02/25/20 1129  GLUCAP 107*   D-Dimer: No results for input(s): DDIMER in the last 72 hours. Hgb A1c: No results for input(s): HGBA1C in the last 72 hours. Lipid Profile: No results for input(s): CHOL, HDL, LDLCALC, TRIG, CHOLHDL, LDLDIRECT in the last 72 hours. Thyroid function studies: No results for input(s): TSH, T4TOTAL, T3FREE, THYROIDAB in the last 72 hours.  Invalid input(s): FREET3 Anemia work up: No results for input(s): VITAMINB12, FOLATE, FERRITIN, TIBC, IRON, RETICCTPCT in the last 72 hours. Sepsis Labs: Recent Labs  Lab 02/23/20 1120 02/25/20 1144 02/26/20 0108 02/27/20 0847  WBC 2.6* 2.7* 3.0* 3.4*    Microbiology Recent Results (from the past 240 hour(s))  Respiratory Panel by RT PCR (Flu A&B, Covid) - Nasopharyngeal Swab     Status: None   Collection Time: 02/25/20  1:19 PM   Specimen: Nasopharyngeal Swab  Result Value Ref Range Status     SARS Coronavirus 2 by RT PCR NEGATIVE NEGATIVE Final    Comment: (NOTE) SARS-CoV-2 target nucleic acids are NOT DETECTED. The SARS-CoV-2 RNA is generally detectable in upper respiratoy specimens during the acute phase of infection. The lowest concentration of SARS-CoV-2 viral copies this assay can detect is 131 copies/mL. A negative result does not preclude SARS-Cov-2 infection and should not be used as the sole basis for treatment or other patient management decisions. A negative result may occur with  improper specimen collection/handling, submission of specimen other than nasopharyngeal swab, presence of viral mutation(s) within the areas targeted by this assay, and inadequate number of viral copies (<131 copies/mL). A negative result must be combined with clinical observations, patient history, and epidemiological information. The expected result is Negative. Fact Sheet for Patients:  PinkCheek.be Fact Sheet for Healthcare Providers:  GravelBags.it This test is not yet ap proved or cleared by the [place] FDA and  has been authorized for detection and/or diagnosis of SARS-CoV-2 by FDA under an Emergency Use Authorization (EUA). This EUA will remain  in effect (meaning this test can be used) for the duration of the COVID-19 declaration under Section 564(b)(1) of the Act, 21 U.S.C. section 360bbb-3(b)(1), unless the authorization is terminated or revoked sooner.    Influenza A by PCR NEGATIVE NEGATIVE Final   Influenza B by PCR NEGATIVE NEGATIVE Final    Comment: (NOTE) The Xpert Xpress SARS-CoV-2/FLU/RSV assay is intended as an aid in  the diagnosis of influenza from Nasopharyngeal swab specimens and  should not be used as a sole basis for treatment. Nasal washings and  aspirates are unacceptable for Xpert Xpress SARS-CoV-2/FLU/RSV  testing. Fact Sheet for Patients: PinkCheek.be Fact  Sheet for Healthcare Providers: GravelBags.it This test is not yet approved or cleared by the [place] FDA and  has been authorized for detection and/or diagnosis of SARS-CoV-2 by  FDA under an Emergency Use Authorization (EUA). This EUA will remain  in effect (meaning this test can be used) for the duration of the  Covid-19 declaration under Section 564(b)(1) of the Act, 21  U.S.C. section  360bbb-3(b)(1), unless the authorization is  terminated or revoked. Performed at [facility], [place] [address], [place], [place] [address]     Procedures and diagnostic studies:  DG Chest 2 View  Result Date: 02/25/2020 CLINICAL DATA:  69 year old male with altered mental status. EXAM: CHEST - 2 VIEW COMPARISON:  02/02/2020 and prior chest radiographs FINDINGS: Cardiomegaly and possible mild pulmonary vascular congestion again noted. There is no evidence of focal airspace disease, pulmonary edema, suspicious pulmonary nodule/mass, pleural effusion, or pneumothorax. No acute bony abnormalities are identified. IMPRESSION: Cardiomegaly with possible mild pulmonary vascular congestion. Electronically Signed   By: Margarette Canada M.D.   On: 02/25/2020 12:26   CT Head Wo Contrast  Result Date: 02/25/2020 CLINICAL DATA:  69 year old male with altered mental status. EXAM: CT HEAD WITHOUT CONTRAST TECHNIQUE: Contiguous axial images were obtained from the base of the skull through the vertex without intravenous contrast. COMPARISON:  01/24/2020 and prior CTs FINDINGS: Brain: No evidence of acute infarction, hemorrhage, hydrocephalus, extra-axial collection or mass lesion/mass effect. Atrophy and mild chronic small-vessel white matter ischemic changes again noted. Vascular: Carotid atherosclerotic calcifications are noted. Skull: Normal. Negative for fracture or focal lesion. Sinuses/Orbits: No acute finding. Other: None. IMPRESSION: 1. No evidence of acute intracranial abnormality. 2.  Atrophy and chronic small-vessel white matter ischemic changes. Electronically Signed   By: Margarette Canada M.D.   On: 02/25/2020 12:28    Medications:   . sodium chloride   Intravenous Once  . allopurinol  200 mg Oral Daily  . Ferrous Fumarate  1 tablet Oral Daily  . folic acid  1 mg Oral Daily  . lactulose  20 g Oral BID  . macitentan  10 mg Oral Daily  . multivitamin with minerals  1 tablet Oral Daily  . pantoprazole  40 mg Oral Daily  . potassium chloride SA  40 mEq Oral BID  . rosuvastatin  5 mg Oral q1800  . Selexipag  1,000 mcg Oral BID  . tadalafil (PAH)  40 mg Oral Daily  . thiamine  100 mg Oral Daily  . torsemide  60 mg Oral BID   Continuous Infusions:   LOS: 1 day   Geradine Girt  [facility]   How to contact the [facility] Attending or Consulting provider [place] or covering provider during after hours [place], for this patient?  1. Check the care team in [facility] and look for a) attending/consulting TRH provider listed and b) the [facility] team listed 2. Log into [email or web address] and use [place]'s universal password to access. If you do not have the password, please contact the hospital operator. 3. Locate the [facility] provider you are looking for under [facility] and page to a number that you can be directly reached. 4. If you still have difficulty reaching the provider, please page the [facility] (Director on Call) for the Hospitalists listed on amion for assistance.  02/27/2020, 9:34 AM

## 2020-02-28 DIAGNOSIS — Z515 Encounter for palliative care: Secondary | ICD-10-CM

## 2020-02-28 LAB — CBC WITH DIFFERENTIAL/PLATELET
Abs Immature Granulocytes: 0.01 10*3/uL (ref 0.00–0.07)
Abs Immature Granulocytes: 0.01 10*3/uL (ref 0.00–0.07)
Basophils Absolute: 0 10*3/uL (ref 0.0–0.1)
Basophils Absolute: 0 10*3/uL (ref 0.0–0.1)
Basophils Relative: 0 %
Basophils Relative: 0 %
Eosinophils Absolute: 0.1 10*3/uL (ref 0.0–0.5)
Eosinophils Absolute: 0 10*3/uL (ref 0.0–0.5)
Eosinophils Relative: 2 %
Eosinophils Relative: 1 %
HCT: 28.7 % — ABNORMAL LOW (ref 39.0–52.0)
HCT: 32.7 % — ABNORMAL LOW (ref 39.0–52.0)
Hemoglobin: 9.2 g/dL — ABNORMAL LOW (ref 13.0–17.0)
Hemoglobin: 10.3 g/dL — ABNORMAL LOW (ref 13.0–17.0)
Immature Granulocytes: 0 %
Immature Granulocytes: 0 %
Lymphocytes Relative: 28 %
Lymphocytes Relative: 23 %
Lymphs Abs: 0.7 10*3/uL (ref 0.7–4.0)
Lymphs Abs: 0.8 10*3/uL (ref 0.7–4.0)
MCH: 33.3 pg (ref 26.0–34.0)
MCH: 32.9 pg (ref 26.0–34.0)
MCHC: 32.1 g/dL (ref 30.0–36.0)
MCHC: 31.5 g/dL (ref 30.0–36.0)
MCV: 104 fL — ABNORMAL HIGH (ref 80.0–100.0)
MCV: 104.5 fL — ABNORMAL HIGH (ref 80.0–100.0)
Monocytes Absolute: 0.3 10*3/uL (ref 0.1–1.0)
Monocytes Absolute: 0.5 10*3/uL (ref 0.1–1.0)
Monocytes Relative: 12 %
Monocytes Relative: 14 %
Neutro Abs: 1.5 10*3/uL — ABNORMAL LOW (ref 1.7–7.7)
Neutro Abs: 2.3 10*3/uL (ref 1.7–7.7)
Neutrophils Relative %: 58 %
Neutrophils Relative %: 62 %
Platelets: 26 10*3/uL — CL (ref 150–400)
Platelets: 30 10*3/uL — ABNORMAL LOW (ref 150–400)
RBC: 2.76 MIL/uL — ABNORMAL LOW (ref 4.22–5.81)
RBC: 3.13 MIL/uL — ABNORMAL LOW (ref 4.22–5.81)
RDW: 18.5 % — ABNORMAL HIGH (ref 11.5–15.5)
RDW: 18.5 % — ABNORMAL HIGH (ref 11.5–15.5)
WBC: 2.6 10*3/uL — ABNORMAL LOW (ref 4.0–10.5)
WBC: 3.7 10*3/uL — ABNORMAL LOW (ref 4.0–10.5)
nRBC: 0 % (ref 0.0–0.2)
nRBC: 0 % (ref 0.0–0.2)

## 2020-02-28 LAB — COMPREHENSIVE METABOLIC PANEL
ALT: 26 U/L (ref 0–44)
AST: 51 U/L — ABNORMAL HIGH (ref 15–41)
Albumin: 2.6 g/dL — ABNORMAL LOW (ref 3.5–5.0)
Alkaline Phosphatase: 177 U/L — ABNORMAL HIGH (ref 38–126)
Anion gap: 8 (ref 5–15)
BUN: 51 mg/dL — ABNORMAL HIGH (ref 8–23)
CO2: 23 mmol/L (ref 22–32)
Calcium: 8.6 mg/dL — ABNORMAL LOW (ref 8.9–10.3)
Chloride: 106 mmol/L (ref 98–111)
Creatinine, Ser: 2.53 mg/dL — ABNORMAL HIGH (ref 0.61–1.24)
GFR calc Af Amer: 29 mL/min — ABNORMAL LOW (ref >60)
GFR calc non Af Amer: 25 mL/min — ABNORMAL LOW (ref >60)
Glucose, Bld: 136 mg/dL — ABNORMAL HIGH (ref 70–99)
Potassium: 3.3 mmol/L — ABNORMAL LOW (ref 3.5–5.1)
Sodium: 137 mmol/L (ref 135–145)
Total Bilirubin: 2 mg/dL — ABNORMAL HIGH (ref 0.3–1.2)
Total Protein: 6.2 g/dL — ABNORMAL LOW (ref 6.5–8.1)

## 2020-02-28 LAB — AMMONIA: Ammonia: 54 umol/L — ABNORMAL HIGH (ref 9–35)

## 2020-02-28 LAB — MAGNESIUM: Magnesium: 2 mg/dL (ref 1.7–2.4)

## 2020-02-28 MED ORDER — POTASSIUM CHLORIDE CRYS ER 20 MEQ PO TBCR
40.0000 meq | EXTENDED_RELEASE_TABLET | Freq: Once | ORAL | Status: AC
  Administered 2020-02-28: 40 meq via ORAL
  Filled 2020-02-28: qty 2

## 2020-02-28 NOTE — TOC Initial Note (Addendum)
Transition of Care [facility]) - Initial/Assessment Note    Patient Details  Name: Darren Mueller MRN: [id number] Date of Birth: 20-Jun-1951  Transition of Care [facility]) CM/SW Contact:    Marilu Favre, RN Phone Number: 02/28/2020, 12:30 PM  Clinical Narrative:                 Damaris Schooner to patient and son [name] at bedside. PT recommendation for CIR. Patient and son aware [facility] inpatient rehab has no bed.   Patient lives with [name] and does not have any DME at home.   They are interested in inpatient rehab at [facility] and also at [facility] in [place]. Both understand NCM will contact both centers and fax patient's clinicals, patient will have to met centers criteria and will need insurance approval.   Spoke to Tally Due at [facility] will fax referral. Left message for admissions at [facility] in [place]. Awaiting call back     If patient not accepted to inpatient rehab he does want SNF.   NCM also discussed outpatient palliative care. Both in agreement. Made referral to [facility]. Spoke to [employer].  [place] admission at  inpatient rehab at [facility] and left a message. Also called admissions with inpatient rehab at  [facility] in [place] and left a message. Awaiting call backs.  Expected Discharge Plan: [place]     Patient Goals and CMS Choice Patient states their goals for this hospitalization and ongoing recovery are:: wants rehab CMS Medicare.gov Compare Post Acute Care list provided to:: Patient Choice offered to / list presented to : Patient, Adult Children  Expected Discharge Plan and Services Expected Discharge Plan: [place]   Discharge Planning Services: CM Consult Post Acute Care Choice: IP Rehab Living arrangements for the past 2 months: Single Family Home                   DME Agency: NA        HH Arranged: NA          Prior Living Arrangements/Services Living arrangements for the past 2 months: Single Family Home Lives with:: Adult Children Patient language and need for interpreter reviewed:: Yes Do you feel safe going back to the place where you live?: Yes      Need for Family Participation in Patient Care: Yes (Comment) Care giver support system in place?: No (comment)   Criminal Activity/Legal Involvement Pertinent to Current Situation/Hospitalization: No - Comment as needed  Activities of Daily Living Home Assistive Devices/Equipment: Eyeglasses ADL Screening (condition at time of admission) Patient's cognitive ability adequate to safely complete daily activities?: Yes Is the patient deaf or have difficulty hearing?: No Does the patient have difficulty seeing, even when wearing glasses/contacts?: No Does the patient have difficulty concentrating, remembering, or making decisions?: No Patient able to express need for assistance with ADLs?: Yes Does the patient have difficulty dressing or bathing?: No Independently performs ADLs?: Yes (appropriate for developmental age) Does the patient have difficulty walking or climbing stairs?: Yes Weakness of Legs: Both Weakness of Arms/Hands: None  Permission Sought/Granted   Permission granted to share information with : Yes, Verbal Permission Granted  Share Information with NAME: [name] son           Emotional Assessment Appearance:: Appears stated age Attitude/Demeanor/Rapport: Engaged Affect (typically observed): Accepting Orientation: : Oriented to Self,  Oriented to Place, Oriented to  Time, Oriented to Situation Alcohol / Substance Use: Not Applicable Psych Involvement: No (comment)  Admission diagnosis:  Hepatic encephalopathy (HCC) [K72.90] Pancytopenia ([place]) [D61.818] Patient Active Problem List   Diagnosis Date Noted  . Palliative care by specialist   . Goals of care, counseling/discussion   . DNR  (do not resuscitate)   . Hepatic encephalopathy ([place]) 02/25/2020  . Neck pain 02/10/2020  . Anorexia 02/10/2020  . AKI (acute kidney injury) ([place])   . Cellulitis of right lower extremity   . Acute exacerbation of CHF (congestive heart failure) ([place]) 01/30/2020  . CHF (congestive heart failure) ([place]) 01/20/2020  . Abdominal pain 08/23/2019  . Grief 08/23/2019  . Acute on chronic diastolic heart failure ([place]) 05/16/2019  . Chronic diastolic CHF (congestive heart failure) ([place]) 05/16/2019  . Calf pain 04/03/2019  . History of colonic polyps   . Polyp of cecum   . Headache 02/21/2019  . Rectal bleeding 02/13/2019  . COPD (chronic obstructive pulmonary disease) ([place]) 02/13/2019  . Acute on chronic right heart failure ([place]) 02/13/2019  . Hematochezia   . Ascites   . Dark stools   . Alcoholism ([place])   . Hyperglycemia 05/04/2018  . Seizure disorder ([place]) 02/01/2018  . Pedal edema 10/01/2017  . RUQ pain 05/06/2017  . Decreased range of motion of left elbow 04/15/2017  . Hyperlipidemia 03/17/2017  . Elbow pain, left 03/17/2017  . Right rib fracture   . Syncope 07/19/2016  . Thrombocytopenia ([place]) 06/29/2016  . Hoarseness, chronic 06/29/2016  . Preventative health care 06/29/2016  . Chest pain   . Low back pain 12/09/2015  . Cirrhosis with alcoholism ([place]) 08/08/2015  . Pancytopenia ([place]) 06/10/2015  . Dermatitis 06/10/2015  . OSA (obstructive sleep apnea) 02/13/2015  . Gout 07/30/2014  . Diarrhea 09/04/2013  . Stage 4 chronic kidney disease ([place]) 07/18/2013  . Right heart failure due to pulmonary hypertension ([place]) 06/06/2013  . Hypothyroidism 01/25/2013  . PAH (pulmonary arterial hypertension) with portal hypertension ([place]) 01/21/2013  . Allergic rhinitis 12/21/2012  . Secondary pulmonary hypertension 12/09/2012  . Hepatic cirrhosis ([place]) 11/26/2012  . GERD (gastroesophageal reflux disease) 09/05/2011  . Hypokalemia 08/29/2011  . Colon polyps 03/26/2011  . Insomnia  03/26/2011  . Hypertension 03/22/2011  . Alcohol dependence ([place]) 03/22/2011   PCP:  Mosie Lukes, MD Pharmacy:   [place], [address] [place] [address] Phone: [phone] Fax: [place], [address] [place] [address] Phone: [phone] Fax: [place], [address] [place] [address] Phone: [phone] Fax: [phone]     Social Determinants of Health (SDOH) Interventions    Readmission Risk Interventions Readmission Risk Prevention Plan 01/23/2020 05/17/2019 02/14/2019  Transportation Screening Complete Complete Complete  PCP or Specialist Appt within 3-5 Days Complete Complete Not Complete  Not Complete comments - - CM had to leave VM for office staff - requesting appt  [place] or [place] Complete Complete Complete  Social Work Consult for [place] Planning/Counseling Complete Complete Complete  Palliative Care Screening Not Applicable Not Applicable Not Applicable  Medication Review (RN Care Manager) Complete Referral to Pharmacy Referral to Pharmacy  Some recent data might be hidden

## 2020-02-28 NOTE — Progress Notes (Signed)
Inpatient Rehabilitation Admissions Coordinator  Inpatient rehab consult received. I met with pateint , his son , [name] at bedside and daughter on conference call. I discussed that I do not have a CIR bed readily available to admit patient to and that his acute MD felt patient likely medically ready to discharge. I did discuss other inpatient rehab facilities as a possible options that could be explored with his RN CM or SW. They would like to have this discussion. Son, [name], cell is [phone]. I have alerted RN Cm, Heather and SW, Isabel and acute team . We will sign off at this time.  Danne Baxter, RN, MSN Rehab Admissions Coordinator [phone] 02/28/2020 10:36 AM

## 2020-02-28 NOTE — Progress Notes (Signed)
Provider made aware of critical platelet. He made nurse aware this is a chronic issue and patient is stable. No orders needed.

## 2020-02-28 NOTE — Consult Note (Signed)
[facility] Inpatient Consult   02/28/2020  ISAH CATBAGAN 1951/04/14 [id number]   Patient is currently active with [facility] [[facility]] Care Management for chronic disease management services.  Patient has been engaged by a [facility] RN Care Corrdinator in the medicare [employer] Accountable Care Organization. Patient was also referred to [facility] for follow up  Our community based plan of care has focused on disease management and community resource support.    Patient will receive a post hospital call and will be evaluated for assessments and disease process education.    Plan: Patient is currently being considered for inpatient rehab at various facilities, [facility] does not have a bed today per notes.  Will follow progress notes and with  Inpatient Transition Of Care [TOC] team member to make aware that [facility] Care Management following.   Of note, [facility] Care Management services does not replace or interfere with any services that are needed or arranged by inpatient [facility] care management team.  For additional questions or referrals please contact:   Charlesetta Shanks, RN BSN CCM [facility]  [phone] business mobile phone Toll free office [phone]  Fax number: [phone] [email or web address]

## 2020-02-28 NOTE — NC FL2 (Signed)
[place] LEVEL OF CARE SCREENING TOOL     IDENTIFICATION  Patient Name: Darren Mueller Birthdate: May 01, 1951 Sex: male Admission Date (Current Location): 02/25/2020  [facility] and [place] Number:  Herbalist and Address:  [facility]. [facility], [place] [address] [place], [place] [address]      Provider Number: [id number]  Attending Physician Name and Address:  Darliss Cheney, MD  Relative Name and Phone Number:       Current Level of Care: Hospital Recommended Level of Care: [place] Prior Approval Number:    Date Approved/Denied:   PASRR Number: [id number]  Discharge Plan: SNF    Current Diagnoses: Patient Active Problem List   Diagnosis Date Noted  . Palliative care by specialist   . Goals of care, counseling/discussion   . DNR (do not resuscitate)   . Hepatic encephalopathy ([place]) 02/25/2020  . Neck pain 02/10/2020  . Anorexia 02/10/2020  . AKI (acute kidney injury) ([place])   . Cellulitis of right lower extremity   . Acute exacerbation of CHF (congestive heart failure) ([place]) 01/30/2020  . CHF (congestive heart failure) ([place]) 01/20/2020  . Abdominal pain 08/23/2019  . Grief 08/23/2019  . Acute on chronic diastolic heart failure ([place]) 05/16/2019  . Chronic diastolic CHF (congestive heart failure) ([place]) 05/16/2019  . Calf pain 04/03/2019  . History of colonic polyps   . Polyp of cecum   . Headache 02/21/2019  . Rectal bleeding 02/13/2019  . COPD (chronic obstructive pulmonary disease) ([place]) 02/13/2019  . Acute on chronic right heart failure ([place]) 02/13/2019  . Hematochezia   . Ascites   . Dark stools   . Alcoholism ([place])   . Hyperglycemia 05/04/2018  . Seizure disorder ([place]) 02/01/2018  . Pedal edema 10/01/2017  . RUQ pain 05/06/2017  . Decreased range of motion of left elbow 04/15/2017  . Hyperlipidemia 03/17/2017  . Elbow pain, left 03/17/2017  . Right rib fracture   . Syncope 07/19/2016  .  Thrombocytopenia ([place]) 06/29/2016  . Hoarseness, chronic 06/29/2016  . Preventative health care 06/29/2016  . Chest pain   . Low back pain 12/09/2015  . Cirrhosis with alcoholism ([place]) 08/08/2015  . Pancytopenia ([place]) 06/10/2015  . Dermatitis 06/10/2015  . OSA (obstructive sleep apnea) 02/13/2015  . Gout 07/30/2014  . Diarrhea 09/04/2013  . Stage 4 chronic kidney disease ([place]) 07/18/2013  . Right heart failure due to pulmonary hypertension ([place]) 06/06/2013  . Hypothyroidism 01/25/2013  . PAH (pulmonary arterial hypertension) with portal hypertension ([place]) 01/21/2013  . Allergic rhinitis 12/21/2012  . Secondary pulmonary hypertension 12/09/2012  . Hepatic cirrhosis ([place]) 11/26/2012  . GERD (gastroesophageal reflux disease) 09/05/2011  . Hypokalemia 08/29/2011  . Colon polyps 03/26/2011  . Insomnia 03/26/2011  . Hypertension 03/22/2011  . Alcohol dependence ([place]) 03/22/2011    Orientation RESPIRATION BLADDER Height & Weight     Time, Self, Situation, Place  Normal Continent Weight: 192 lb 0.3 oz (87.1 kg) Height:  _0  (185.4 cm)  BEHAVIORAL SYMPTOMS/MOOD NEUROLOGICAL BOWEL NUTRITION STATUS      Continent Diet(see discharge summary)  AMBULATORY STATUS COMMUNICATION OF NEEDS Skin   Limited Assist   Skin abrasions, Other (Comment)(cracking and abrasions on feet and legs)                       Personal Care Assistance Level of Assistance  Bathing, Feeding, Dressing Bathing Assistance: Limited assistance Feeding assistance: Independent Dressing Assistance: Limited assistance  Functional Limitations Info  Sight, Hearing, Speech Sight Info: Impaired Hearing Info: Adequate Speech Info: Adequate    SPECIAL CARE FACTORS FREQUENCY  OT (By licensed OT), PT (By licensed PT)     PT Frequency: 5x week OT Frequency: 5x week            Contractures Contractures Info: Not present    Additional Factors Info  Code Status, Allergies Code Status Info: DNR Allergies  Info: Aspirin, Nitroglycerin           Current Medications (02/28/2020):  This is the current hospital active medication list Current Facility-Administered Medications  Medication Dose Route Frequency Provider Last Rate Last Admin  . 0.9 %  sodium chloride infusion (Manually program via Guardrails IV Fluids)   Intravenous Once Bodenheimer, Clenton Pare, NP      . Ferrous Fumarate (HEMOCYTE - 106 mg FE) tablet 106 mg of iron  1 tablet Oral Daily Karmen Bongo, MD   106 mg of iron at 02/28/20 1047  . folic acid (FOLVITE) tablet 1 mg  1 mg Oral Daily Karmen Bongo, MD   1 mg at 02/28/20 1046  . hydrALAZINE (APRESOLINE) injection 5 mg  5 mg Intravenous Q4H PRN Karmen Bongo, MD      . HYDROcodone-acetaminophen (NORCO/VICODIN) 5-325 MG per tablet 1-2 tablet  1-2 tablet Oral Q4H PRN Karmen Bongo, MD   1 tablet at 02/27/20 0841  . lactulose (CHRONULAC) 10 GM/15ML solution 20 g  20 g Oral BID Eulogio Bear U, DO   Stopped at 02/27/20 2200  . macitentan (OPSUMIT) tablet 10 mg  10 mg Oral Daily Karmen Bongo, MD   10 mg at 02/28/20 1047  . morphine 2 MG/ML injection 2 mg  2 mg Intravenous Q2H PRN Karmen Bongo, MD      . ondansetron [facility]) injection 4 mg  4 mg Intravenous Q8H PRN Eulogio Bear U, DO   4 mg at 02/26/20 1850  . pantoprazole (PROTONIX) EC tablet 40 mg  40 mg Oral Daily Karmen Bongo, MD   40 mg at 02/28/20 1050  . potassium chloride SA (KLOR-CON) CR tablet 40 mEq  40 mEq Oral BID Karmen Bongo, MD   40 mEq at 02/28/20 1052  . potassium chloride SA (KLOR-CON) CR tablet 40 mEq  40 mEq Oral Once Clegg, Amy D, NP      . rosuvastatin (CRESTOR) tablet 5 mg  5 mg Oral q1800 Karmen Bongo, MD   5 mg at 02/27/20 1705  . Selexipag TABS 1,000 mcg  1,000 mcg Oral BID Karmen Bongo, MD   1,000 mcg at 02/28/20 1043  . tadalafil (PAH) (ADCIRCA) tablet 40 mg  40 mg Oral Daily Karmen Bongo, MD   40 mg at 02/28/20 1048  . thiamine tablet 100 mg  100 mg Oral Daily Karmen Bongo, MD    100 mg at 02/28/20 1050  . torsemide (DEMADEX) tablet 60 mg  60 mg Oral BID Karmen Bongo, MD   60 mg at 02/28/20 1051     Discharge Medications: Please see discharge summary for a list of discharge medications.  Relevant Imaging Results:  Relevant Lab Results:   Additional Information [id number] [place], [place]

## 2020-02-28 NOTE — Progress Notes (Signed)
New Palliative referral:  Hydrologist [facility])  Hospital Liaison: RN note       Notified by [facility] manager of patient/family request for [facility] Palliative services at discharge.       Spoke with patient and to patients son [name] to confirm interest and explain services.             [place] Palliative team will follow up with patient after discharge.       Please call with any hospice or palliative related questions.       Thank you for this referral.       Clementeen Hoof, BSN, RN [facility] Liaison (listed on [place] under Hospice and [place] [facility] [place])   [phone]

## 2020-02-28 NOTE — Progress Notes (Signed)
PROGRESS NOTE    Darren Mueller  RCB:[id number] DOB: 05/10/1951 DOA: 02/25/2020 PCP: Mosie Lukes, MD   Brief Narrative:  Darren Mueller is an 69 y.o. male with medical history significant ofhypothyroidism; OSA not on CPAP; seizures; HLD; HTN; CAD; COPD; stage 3 CKD; alcoholic cirrhosis with ascites; h/o ETOH dependence; and chronic diastolic CHF presenting with AMS.His daughter reports that he has been very confused, disoriented, weaker than normal. Yesterday, he walked with OT but today he was slumped over the table and unable to stand on his own. Also not oriented even to person in the ER. He has never had this issue before and does not take lactulose at home. He was perfectly himself yesterday. He does have known alcoholic liver disease with h/o ascites. No ETOH in the last several months.  Assessment & Plan:   Principal Problem:   Hepatic encephalopathy ([place]) Active Problems:   Hypertension   Alcohol dependence ([place])   Hepatic cirrhosis (HCC)   PAH (pulmonary arterial hypertension) with portal hypertension (HCC)   Stage 4 chronic kidney disease (HCC)   OSA (obstructive sleep apnea)   Pancytopenia (HCC)   Cirrhosis with alcoholism (HCC)   Thrombocytopenia (HCC)   Hyperlipidemia   Seizure disorder (HCC)   COPD (chronic obstructive pulmonary disease) (HCC)   Chronic diastolic CHF (congestive heart failure) ([place])   Goals of care, counseling/discussion   DNR (do not resuscitate)   Palliative care by specialist   Acute hepatic encephalopathy: -Presenting with acute onset of AMS -Ammonia112 elevated initially but has since been trending down and just over 50 now.  He is completely alert and oriented.  Continue lactulose.  Cirrhosis -MELD/MELD-Na score is 23/24, with a mortality rate of 19.6% -He is not a candidate for liver transplant, as he stopped drinking only 2 months ago -Continue diuretics -Patient follows with Dr. Ardis Hughs: We will defer  addition of BB  and also Rifaximin (unless he continues to be encephalopathic with proper lactulose dose)  Pulmonary HTN/COPD -Continue home meds - Opsumit, Selexipag, Tadalafil -He appears to be compensated from this standpoint at this time Heart failure/cardiology on board.  Appreciate Dr. Clayborne Dana recommendations.  Pancytopenia -Appears to be stable -Avoid heparin, NSAIDs -Continue Hemocyte  Stage 4 CKD -Advanced CKD, followed by Dr. Marval Regal -Appears to be stable at this time:  History of alcohol abuse -Patient states he has not used for 2 months -Alcohol level here negative -No signs of withdrawal  HLD -Continue Crestor  Chronic diastolic CHF -06/05/20 echo with presented EF, moderate LVH, and grade 2 diastolic dysfunction -Marked LE edema but his daughter reports that this is improved from prior -Renal function is stable despite recent diuresis -Patient reports that plan was to increase torsemide dose from 60 BID to 80 BID; however last cardiology note indicates plan for 60 mg PO BID -For now, will continue 60 mg PO BID and continue to follow  Seizures -Followed by Dr. Delice Lesch -Continue Keppra  OSA -Not on CPAP  DVT prophylaxis: SCD   Code Status: DNR  Family Communication: Son  present at bedside.  Plan of care discussed with patient in length and he verbalized understanding and agreed with it. Patient is from: Home Disposition Plan: CIR. no bed availability at CIR and no expectation for this week.  TOC team looking at other CIR options. Barriers to discharge: No bed availability  Status is: Inpatient  Remains inpatient appropriate because:Unsafe d/c plan   Dispo: The patient is from: Home  Anticipated d/c is to: CIR              Anticipated d/c date is: 1 day              Patient currently is medically stable to d/c.         Estimated body mass index is 25.33 kg/m as calculated from the following:   Height as of this encounter: _0   (1.854 m).   Weight as of this encounter: 87.1 kg.      Nutritional status:               Consultants:   Cardiology  Procedures:   None  Antimicrobials:  Anti-infectives (From admission, onward)   None         Subjective: Patient seen and examined.  Son at the bedside.  Patient has no complaints.  He is alert and oriented.  Objective: Vitals:   02/27/20 1646 02/28/20 0039 02/28/20 0536 02/28/20 1240  BP: 118/70 124/77 106/70 108/63  Pulse: 84 87 83 75  Resp: _1 Temp: (!) 97.5 F (36.4 C) 98 F (36.7 C) 98 F (36.7 C) (!) 97.5 F (36.4 C)  TempSrc: Oral Oral Oral Oral  SpO2: 100% 98% 99% 98%  Weight:      Height:        Intake/Output Summary (Last 24 hours) at 02/28/2020 1524 Last data filed at 02/27/2020 1706 Gross per 24 hour  Intake 360 ml  Output --  Net 360 ml   Filed Weights   02/25/20 1749  Weight: 87.1 kg    Examination:  General exam: Appears calm and comfortable  Respiratory system: Clear to auscultation. Respiratory effort normal. Cardiovascular system: S1 & S2 heard, RRR. No JVD, murmurs, rubs, gallops or clicks. No pedal edema. Gastrointestinal system: Abdomen is nondistended, soft and nontender. No organomegaly or masses felt. Normal bowel sounds heard.  Mild fluid shift suggestive of ascites. Central nervous system: Alert and oriented. No focal neurological deficits. Extremities: Symmetric 5 x 5 power. Skin: No rashes, lesions or ulcers Psychiatry: Judgement and insight appear normal. Mood & affect appropriate.    Data Reviewed: I have personally reviewed following labs and imaging studies  CBC: Recent Labs  Lab 02/23/20 1120 02/25/20 1144 02/26/20 0108 02/27/20 0847 02/28/20 0811  WBC 2.6* 2.7* 3.0* 3.4* 2.6*  NEUTROABS  --  1.7  --   --  1.5*  HGB 10.4* 10.6* 10.5* 10.0* 9.2*  HCT 33.1* 34.3* 32.5* 32.5* 28.7*  MCV 103.4* 106.2* 101.2* 105.5* 104.0*  PLT 28* 26* 27* 33* 26*   Basic Metabolic  Panel: Recent Labs  Lab 02/23/20 1120 02/25/20 1144 02/26/20 0108 02/27/20 0847 02/28/20 0811  NA 135 138 138 139 137  K 3.8 3.7 3.6 4.2 3.3*  CL 101 104 105 107 106  CO2 21* 21* 21* 22 23  GLUCOSE 104* 127* 111* 137* 136*  BUN 42* 48* 46* 56* 51*  CREATININE 3.05* 2.94* 2.50* 2.68* 2.53*  CALCIUM 9.0 9.3 9.3 9.0 8.6*  MG  --   --   --   --  2.0   GFR: Estimated Creatinine Clearance: 30.7 mL/min (A) (by C-G formula based on SCr of 2.53 mg/dL (H)). Liver Function Tests: Recent Labs  Lab 02/25/20 1144 02/26/20 0108 02/28/20 0811  AST 83* 92* 51*  ALT 36 39 26  ALKPHOS 271* 264* 177*  BILITOT 2.7* 2.6* 2.0*  PROT 6.8 6.7 6.2*  ALBUMIN 3.1* 3.0* 2.6*   No  results for input(s): LIPASE, AMYLASE in the last 168 hours. Recent Labs  Lab 02/25/20 1144 02/26/20 0108 02/28/20 0811  AMMONIA 112* 80* 54*   Coagulation Profile: Recent Labs  Lab 02/25/20 1144 02/26/20 0108  INR 1.2 1.2   Cardiac Enzymes: No results for input(s): CKTOTAL, CKMB, CKMBINDEX, TROPONINI in the last 168 hours. BNP (last 3 results) Recent Labs    10/11/19 1138 12/08/19 1041 01/11/20 1439  PROBNP 604.0* 710.0* 494.0*   HbA1C: No results for input(s): HGBA1C in the last 72 hours. CBG: Recent Labs  Lab 02/25/20 1129  GLUCAP 107*   Lipid Profile: No results for input(s): CHOL, HDL, LDLCALC, TRIG, CHOLHDL, LDLDIRECT in the last 72 hours. Thyroid Function Tests: No results for input(s): TSH, T4TOTAL, FREET4, T3FREE, THYROIDAB in the last 72 hours. Anemia Panel: No results for input(s): VITAMINB12, FOLATE, FERRITIN, TIBC, IRON, RETICCTPCT in the last 72 hours. Sepsis Labs: No results for input(s): PROCALCITON, LATICACIDVEN in the last 168 hours.  Recent Results (from the past 240 hour(s))  Respiratory Panel by RT PCR (Flu A&B, Covid) - Nasopharyngeal Swab     Status: None   Collection Time: 02/25/20  1:19 PM   Specimen: Nasopharyngeal Swab  Result Value Ref Range Status   SARS  Coronavirus 2 by RT PCR NEGATIVE NEGATIVE Final    Comment: (NOTE) SARS-CoV-2 target nucleic acids are NOT DETECTED. The SARS-CoV-2 RNA is generally detectable in upper respiratoy specimens during the acute phase of infection. The lowest concentration of SARS-CoV-2 viral copies this assay can detect is 131 copies/mL. A negative result does not preclude SARS-Cov-2 infection and should not be used as the sole basis for treatment or other patient management decisions. A negative result may occur with  improper specimen collection/handling, submission of specimen other than nasopharyngeal swab, presence of viral mutation(s) within the areas targeted by this assay, and inadequate number of viral copies (<131 copies/mL). A negative result must be combined with clinical observations, patient history, and epidemiological information. The expected result is Negative. Fact Sheet for Patients:  PinkCheek.be Fact Sheet for Healthcare Providers:  GravelBags.it This test is not yet ap proved or cleared by the [place] FDA and  has been authorized for detection and/or diagnosis of SARS-CoV-2 by FDA under an Emergency Use Authorization (EUA). This EUA will remain  in effect (meaning this test can be used) for the duration of the COVID-19 declaration under Section 564(b)(1) of the Act, 21 U.S.C. section 360bbb-3(b)(1), unless the authorization is terminated or revoked sooner.    Influenza A by PCR NEGATIVE NEGATIVE Final   Influenza B by PCR NEGATIVE NEGATIVE Final    Comment: (NOTE) The Xpert Xpress SARS-CoV-2/FLU/RSV assay is intended as an aid in  the diagnosis of influenza from Nasopharyngeal swab specimens and  should not be used as a sole basis for treatment. Nasal washings and  aspirates are unacceptable for Xpert Xpress SARS-CoV-2/FLU/RSV  testing. Fact Sheet for Patients: PinkCheek.be Fact Sheet  for Healthcare Providers: GravelBags.it This test is not yet approved or cleared by the [place] FDA and  has been authorized for detection and/or diagnosis of SARS-CoV-2 by  FDA under an Emergency Use Authorization (EUA). This EUA will remain  in effect (meaning this test can be used) for the duration of the  Covid-19 declaration under Section 564(b)(1) of the Act, 21  U.S.C. section 360bbb-3(b)(1), unless the authorization is  terminated or revoked. Performed at [facility], [place] [address] [place], [place] [address]  Radiology Studies: US Abdomen Limited  Result Date: 02/27/2020 CLINICAL DATA:  Ascites EXAM: ULTRASOUND ABDOMEN LIMITED RIGHT UPPER QUADRANT COMPARISON:  01/23/2020 FINDINGS: Gallbladder: Not well distended. Wall thickening, which may be reactive. No gallstones. No sonographic Murphy sign noted by sonographer. Common bile duct: Diameter: 6 mm Liver: No focal lesion identified. Coarsening of echotexture. Increased parenchymal echogenicity. Surface contour nodularity. Portal vein is patent on color Doppler imaging with normal direction of blood flow towards the liver. Other: Small volume ascites about the liver, right lower quadrant, and left lower quadrant. Splenomegaly. IMPRESSION: Cirrhosis. Evidence of portal hypertension including mild ascites and splenomegaly. Electronically Signed   By: Macy Mis M.D.   On: 02/27/2020 15:22    Scheduled Meds: . sodium chloride   Intravenous Once  . Ferrous Fumarate  1 tablet Oral Daily  . folic acid  1 mg Oral Daily  . lactulose  20 g Oral BID  . macitentan  10 mg Oral Daily  . pantoprazole  40 mg Oral Daily  . potassium chloride SA  40 mEq Oral BID  . potassium chloride  40 mEq Oral Once  . rosuvastatin  5 mg Oral q1800  . Selexipag  1,000 mcg Oral BID  . tadalafil (PAH)  40 mg Oral Daily  . thiamine  100 mg Oral Daily  . torsemide  60 mg Oral BID   Continuous Infusions:    LOS: 2 days   Time spent: 35 minutes   Darliss Cheney, MD [facility]  02/28/2020, 3:24 PM   To contact the attending provider between 7A-7P or the covering provider during after hours 7P-7A, please log into the web site [email or web address].

## 2020-02-28 NOTE — Progress Notes (Addendum)
Advanced Heart Failure Rounding Note  PCP-Cardiologist: Glori Bickers, MD   Subjective:     Feeling much better. Denies SOB.   Objective:   Weight Range: 87.1 kg Body mass index is 25.33 kg/m.   Vital Signs:   Temp:  [97.5 F (36.4 C)-98 F (36.7 C)] 98 F (36.7 C) (04/27 0536) Pulse Rate:  [83-87] 83 (04/27 0536) Resp:  [16] 16 (04/27 0536) BP: (106-124)/(70-77) 106/70 (04/27 0536) SpO2:  [98 %-100 %] 99 % (04/27 0536) Last BM Date: 02/27/20  Weight change: Filed Weights   02/25/20 1749  Weight: 87.1 kg    Intake/Output:   Intake/Output Summary (Last 24 hours) at 02/28/2020 0845 Last data filed at 02/27/2020 1706 Gross per 24 hour  Intake 960 ml  Output 150 ml  Net 810 ml      Physical Exam    General:  No resp difficulty HEENT: Normal anicteric  Neck: Supple. JVP 5-6 . Carotids 2+ bilat; no bruits. No lymphadenopathy or thyromegaly appreciated. Cor: PMI nondisplaced. Regular rate & rhythm. 2/6 TR loud P2 Lungs: Clear no wheeze Abdomen: Soft, nontender, nondistended. No hepatosplenomegaly. No bruits or masses. Good bowel sounds. Extremities: no cyanosis, clubbing, rash, edema Neuro: alert & oriented x 3, cranial nerves grossly intact. moves all 4 extremities w/o difficulty. Affect pleasant  Telemetry   n/a  EKG    n/a  Labs    CBC Recent Labs    02/25/20 1144 02/25/20 1144 02/26/20 0108 02/27/20 0847  WBC 2.7*   < > 3.0* 3.4*  NEUTROABS 1.7  --   --   --   HGB 10.6*   < > 10.5* 10.0*  HCT 34.3*   < > 32.5* 32.5*  MCV 106.2*   < > 101.2* 105.5*  PLT 26*   < > 27* 33*   < > = values in this interval not displayed.   Basic Metabolic Panel Recent Labs    02/26/20 0108 02/27/20 0847  NA 138 139  K 3.6 4.2  CL 105 107  CO2 21* 22  GLUCOSE 111* 137*  BUN 46* 56*  CREATININE 2.50* 2.68*  CALCIUM 9.3 9.0   Liver Function Tests Recent Labs    02/25/20 1144 02/26/20 0108  AST 83* 92*  ALT 36 39  ALKPHOS 271* 264*    BILITOT 2.7* 2.6*  PROT 6.8 6.7  ALBUMIN 3.1* 3.0*   No results for input(s): LIPASE, AMYLASE in the last 72 hours. Cardiac Enzymes No results for input(s): CKTOTAL, CKMB, CKMBINDEX, TROPONINI in the last 72 hours.  BNP: BNP (last 3 results) Recent Labs    01/30/20 1605 02/23/20 1120 02/25/20 1144  BNP >4,500.0* 131.2* 95.9    ProBNP (last 3 results) Recent Labs    10/11/19 1138 12/08/19 1041 01/11/20 1439  PROBNP 604.0* 710.0* 494.0*     D-Dimer No results for input(s): DDIMER in the last 72 hours. Hemoglobin A1C No results for input(s): HGBA1C in the last 72 hours. Fasting Lipid Panel No results for input(s): CHOL, HDL, LDLCALC, TRIG, CHOLHDL, LDLDIRECT in the last 72 hours. Thyroid Function Tests No results for input(s): TSH, T4TOTAL, T3FREE, THYROIDAB in the last 72 hours.  Invalid input(s): FREET3  Other results:   Imaging    US Abdomen Limited  Result Date: 02/27/2020 CLINICAL DATA:  Ascites EXAM: ULTRASOUND ABDOMEN LIMITED RIGHT UPPER QUADRANT COMPARISON:  01/23/2020 FINDINGS: Gallbladder: Not well distended. Wall thickening, which may be reactive. No gallstones. No sonographic Murphy sign noted by sonographer. Common bile  duct: Diameter: 6 mm Liver: No focal lesion identified. Coarsening of echotexture. Increased parenchymal echogenicity. Surface contour nodularity. Portal vein is patent on color Doppler imaging with normal direction of blood flow towards the liver. Other: Small volume ascites about the liver, right lower quadrant, and left lower quadrant. Splenomegaly. IMPRESSION: Cirrhosis. Evidence of portal hypertension including mild ascites and splenomegaly. Electronically Signed   By: Macy Mis M.D.   On: 02/27/2020 15:22      Medications:     Scheduled Medications: . sodium chloride   Intravenous Once  . Ferrous Fumarate  1 tablet Oral Daily  . folic acid  1 mg Oral Daily  . lactulose  20 g Oral BID  . macitentan  10 mg Oral Daily  .  pantoprazole  40 mg Oral Daily  . potassium chloride SA  40 mEq Oral BID  . rosuvastatin  5 mg Oral q1800  . Selexipag  1,000 mcg Oral BID  . tadalafil (PAH)  40 mg Oral Daily  . thiamine  100 mg Oral Daily  . torsemide  60 mg Oral BID     Infusions:   PRN Medications:  hydrALAZINE, HYDROcodone-acetaminophen, morphine injection, ondansetron (ZOFRAN) IV    Assessment/Plan  1. AMS On admit CT of head negative.  - Ammonia 112>80>pending  - On lacutulose - Back to baseline.    2. PAH  -ECHO 01/2020 EF 60-65% RVSP 83 Grade II DD -On triple therapy- selexipag 1000 mcg twice a day. Intolerant higher dose due to diarrhea and headaches . - Continue macitentan 10 daily and adcirca 40 mg daily.  3. Chronic Diastolic Heart Failure From HF perspective he is stable. He has had 2 admits in March for A/C Diastolic Heart Failure.  -Volume status stable.  - Continue torsemide 60 mg twice a day.  - Renal function stable.   4. CKD Stage IV Creatinine baseline 2.5-2.8  - BMET pending.   5. OSA  Intolerant CPAP   6. Pancytopenia  Platelets 33 > pending   WBC 3.4 > pending  Has been followed by Dr Alen Blew  7. Elevated Bilirubin  Total Bilirubin 2.6   8. Cirrhosis, [place]  [place] Abd 01/23/20 with cirrhosis  9. Deconditioning  PT/OT following with recommendations for CIR.   10. Palliative Care- Goals of  Care . Now DNR/DNI ok for meds   Pharmacy to follow later to discuss medications.   Length of Stay: 2  Darrick Grinder, NP  02/28/2020, 8:45 AM  Advanced Heart Failure Team Pager [phone] (M-F; [place])  Please contact [facility] for night-coverage after hours (4p -7a ) and weekends on [email or web address]  Patient seen and examined with the above-signed Advanced Practice Provider and/or Housestaff. I personally reviewed laboratory data, imaging studies and relevant notes. I independently examined the patient and formulated the important aspects of the plan. I have edited the note  to reflect any of my changes or salient points. I have personally discussed the plan with the patient and/or family.  Much improved. [place] for d/c to CIR today from our standpoint. Labs pending   We reviewed his medications in detail and PharmD will sit with him and his family later today to review again.   Exam as above (edited)  We will follow him closely as outpatient.  Code status update to DNR. See my note from last night   Glori Bickers, MD  9:10 AM

## 2020-02-28 NOTE — Progress Notes (Addendum)
Occupational Therapy Treatment Patient Details Name: Darren Mueller MRN: [id number] DOB: 1951-09-27 Today's Date: 02/28/2020    History of present illness Darren Mueller is a 69 y.o. male s/p presenting with AMS. and weakness found to have hepatic encephalopathy and involves alcoholic cirrhosis. CT head negative. PMHx: hypothyroidism; OSA not on CPAP; seizures; HLD; HTN; CAD; COPD; stage 3 CKD; alcoholic cirrhosis with ascites; h/o ETOH dependence; and chronic diastolic CHF.   OT comments  Pt presents supine in bed pleasant and willing to participate in therapy session, son present and supportive throughout. Pt completing room and hallway level mobility using RW, completing mobility tasks with overall close minguard assist, and x1 seated rest break with activity completion. Pt will benefit from trial of additional mobility tasks without DME as he is typically independent with mobility. Additional focus of session on further addressing/assessing cognition. Pt appears to have improved awareness this session and able to tell OT about current situation/reason for hospitalization. Administered Short Blessed Test with pt receiving score of 12 (10 or more is considered considered an impairment consistent with dementia/dementing disorder). Pt most notably with deficits in STM and multi-step command following. Pt is progressing towards OT goals, though feel he will continue to benefit from acute OT services as well as post acute rehab services to further address balance, cognition and overall strength/endurance. pt may reach point of returning home however given current cognitive level recommend he have 24hr supervision/assist at time of discharge. Will follow.   Of note have spoken with CM/SW who has been in contact with RN throughout today - per RN report pt has been up mobilizing without AD or assist (mobilizing longer distances). Given mobility status in OT session and report of pt progress/mobility  throughout remainder of today feel pt is likely more appropriate for home with [facility] therapies at this time. Given continued cognitive deficits continue to recommend pt have close to 24hr supervision to maximize his safety and independence with ADL/mobility.    Follow Up Recommendations  Home health OT;Supervision/Assistance - 24 hour    Equipment Recommendations  3 in 1 bedside commode;Other (comment)(defer to next venue)          Precautions / Restrictions Precautions Precautions: Fall Precaution Comments: high fall Restrictions Weight Bearing Restrictions: No       Mobility Bed Mobility Overal bed mobility: Needs Assistance Bed Mobility: Supine to Sit     Supine to sit: Supervision     General bed mobility comments: for safety, increased time/effort. Pt left seated EOB with son present end of session   Transfers Overall transfer level: Needs assistance Equipment used: Rolling walker (2 wheeled) Transfers: Sit to/from Stand Sit to Stand: Min guard         General transfer comment: for safety and balance, VCs for safe hand placement. completed x2 during session     Balance Overall balance assessment: Needs assistance Sitting-balance support: Feet supported Sitting balance-Leahy Scale: Good     Standing balance support: Bilateral upper extremity supported;During functional activity Standing balance-Leahy Scale: Poor Standing balance comment: pt requiring bil UE support today                           ADL either performed or assessed with clinical judgement   ADL Overall ADL's : Needs assistance/impaired Eating/Feeding: Set up;Sitting   Grooming: Set up;Sitting  Functional mobility during ADLs: Min guard;Rolling walker General ADL Comments: pt continues to present with impaired cognition, weakness; use of RW for mobility tasks today but would benefit from challenging balance without AD as pt typically  independent with mobility                        Cognition Arousal/Alertness: Awake/alert Behavior During Therapy: WFL for tasks assessed/performed Overall Cognitive Status: Impaired/Different from baseline Area of Impairment: Safety/judgement;Awareness;Problem solving;Following commands;Memory                     Memory: Decreased short-term memory Following Commands: Follows one step commands consistently;Follows multi-step commands with increased time;Follows multi-step commands inconsistently Safety/Judgement: Decreased awareness of safety;Decreased awareness of deficits Awareness: Emergent Problem Solving: Slow processing;Decreased initiation;Requires verbal cues General Comments: pt with improvements in awareness today, able to tell therapist why he is in the hospital, administered Short Blessed Test during session with pt scoring 12 (per scoring interpretation, scores of 10 or more are considered "impairment consistent with dementia"         Exercises     Shoulder Instructions       General Comments pt's son present during session    Pertinent Vitals/ Pain       Pain Assessment: 0-10 Pain Score: 8  Pain Location: headache  Pain Descriptors / Indicators: Headache Pain Intervention(s): Limited activity within patient's tolerance;Monitored during session  Home Living                                          Prior Functioning/Environment              Frequency  Min 2X/week        Progress Toward Goals  OT Goals(current goals can now be found in the care plan section)  Progress towards OT goals: Progressing toward goals  Acute Rehab OT Goals Patient Stated Goal: to go to rehab to become independent again OT Goal Formulation: With patient/family Time For Goal Achievement: 03/11/20 Potential to Achieve Goals: Good ADL Goals Pt Will Perform Grooming: with supervision;standing Pt Will Perform Lower Body Dressing: with  supervision;sitting/lateral leans;sit to/from stand Pt Will Transfer to Toilet: with supervision;ambulating;bedside commode Pt/caregiver will Perform Home Exercise Program: Increased strength;Both right and left upper extremity;With Supervision Additional ADL Goal #1: Pt will consistently recall 3/3 items after different intervals of time to continue to assess memory strategies. Additional ADL Goal #2: Pt will consistently follow multi-step commands with minimal cues to attend to task.  Plan Discharge plan remains appropriate    Co-evaluation                 AM-PAC OT "6 Clicks" Daily Activity     Outcome Measure   Help from another person eating meals?: A Little Help from another person taking care of personal grooming?: A Little Help from another person toileting, which includes using toliet, bedpan, or urinal?: A Lot Help from another person bathing (including washing, rinsing, drying)?: A Lot Help from another person to put on and taking off regular upper body clothing?: A Little Help from another person to put on and taking off regular lower body clothing?: A Lot 6 Click Score: 15    End of Session Equipment Utilized During Treatment: Gait belt;Rolling walker  OT Visit Diagnosis: Unsteadiness on feet (R26.81);Muscle weakness (generalized) (M62.81);Pain Pain - part  of body: (headache)   Activity Tolerance Patient tolerated treatment well   Patient Left in bed;with call bell/phone within reach;Other (comment);with family/visitor present(seated EOB)   Nurse Communication Mobility status        Time: 3225-6720 OT Time Calculation (min): 27 min  Charges: OT General Charges $OT Visit: 1 Visit OT Treatments $Therapeutic Activity: 8-22 mins $Cognitive Funtion inital: Initial 15 mins  Lou Cal, OT Acute Rehabilitation Services Pager [phone] Office [phone]    Raymondo Band 02/28/2020, 1:21 PM

## 2020-02-28 NOTE — Progress Notes (Addendum)
Physical Therapy Treatment Patient Details Name: Darren Mueller MRN: [id number] DOB: February 16, 1951 Today's Date: 02/28/2020    History of Present Illness Darren Mueller is a 69 y.o. male s/p presenting with AMS. and weakness found to have hepatic encephalopathy and involves alcoholic cirrhosis. CT head negative. PMHx: hypothyroidism; OSA not on CPAP; seizures; HLD; HTN; CAD; COPD; stage 3 CKD; alcoholic cirrhosis with ascites; h/o ETOH dependence; and chronic diastolic CHF.    PT Comments    Pt is making excellent progress.  He is ambulating with family in addition to PT.  Pt does still ambulate with abnormal gait compared to baseline and scored a 17 on DGI indicating fall risk.  He will still benefit from therapy. Per notes - cognition is also improving.  Pt was able to follow commands and demonstrated good safety awareness with cautious gait with therapy.  Pt is likely progressing past the point of needing rehab at d/c, but does still need supervision at home initially.  Sister was present and reports interested in options for assistance at home as they cannot provide 24 hr supv.       Follow Up Recommendations  Supervision/Assistance - 24 hour;Home health PT      Equipment Recommendations  Rolling walker with 5" wheels    Recommendations for Other Services       Precautions / Restrictions Precautions Precautions: Fall    Mobility  Bed Mobility Overal bed mobility: Needs Assistance       Supine to sit: Independent Sit to supine: Independent      Transfers Overall transfer level: Needs assistance Equipment used: None Transfers: Sit to/from Stand Sit to Stand: Supervision            Ambulation/Gait Ambulation/Gait assistance: Supervision Gait Distance (Feet): 400 Feet Assistive device: None(but did reach for wall or handrail 25% of time) Gait Pattern/deviations: Shuffle;Step-through pattern     General Gait Details: Pt with short shuffling steps but no LOB;  did use hand rail/wall ~25% of the time.  Advised use of cane or walker at home for safety.  Pt has also been ambulating in hall with family.     Stairs Stairs: Yes Stairs assistance: Min guard Stair Management: One rail Right;Step to pattern Number of Stairs: 5 General stair comments: performed stairs with min guard for safety   Wheelchair Mobility    Modified Rankin (Stroke Patients Only)       Balance Overall balance assessment: Needs assistance   Sitting balance-Leahy Scale: Normal     Standing balance support: No upper extremity supported;During functional activity Standing balance-Leahy Scale: Good Standing balance comment: Pt was able to don face mask and gait belt in standing with min pertubations without LOB                 Standardized Balance Assessment Standardized Balance Assessment : Dynamic Gait Index   Dynamic Gait Index Level Surface: Mild Impairment Change in Gait Speed: Mild Impairment Gait with Horizontal Head Turns: Normal Gait with Vertical Head Turns: Normal Gait and Pivot Turn: Mild Impairment Step Over Obstacle: Mild Impairment Step Around Obstacles: Mild Impairment Steps: Moderate Impairment Total Score: 17      Cognition Arousal/Alertness: Awake/alert Behavior During Therapy: Flat affect Overall Cognitive Status: Impaired/Different from baseline                       Memory: Decreased short-term memory Following Commands: Follows one step commands consistently;Follows multi-step commands with increased time  Problem Solving: Slow processing General Comments: Pt was alert and oriented x 4 and able to follow commands.  No formal cognition test with PT but pt did remember to grab face mask prior to walking out in hall and followed commands.Noted in OT note that pt does have cog deficits when tested.       Exercises      General Comments General comments (skin integrity, edema, etc.): Pt's sister present.  Noted in  OT note and case manager that pt progressing and that bed at [facility] is not available.  Pt reports that still looking into rehab options but on return to room sister was asking about 24 hr nursing care at home.  PT discussed with pt and sister that he is progressing with mobility and may no longer qualify for rehab as he is doing too well.  Did discuss does have some decreased safety and would still recommend 24 hr supervision initially and HHPT/OT to advance mobility at home.  If 24 hr supv not available may need to consider other options (home aide to assist at times, SNF, ALF).  Discussed that typically 24 hr nursing care is not covered by Medicare but case manager could provide further information.  They report that son goes back to school in a couple months and is currently looking for a job , so 24 hr assist not available.  Advised use of AD at home.  Sister asked about driving - advised not at this time, until cleared by MD.      Pertinent Vitals/Pain Pain Assessment: No/denies pain    Home Living                      Prior Function            PT Goals (current goals can now be found in the care plan section) Progress towards PT goals: Progressing toward goals    Frequency    Min 3X/week      PT Plan Current plan remains appropriate    Co-evaluation              AM-PAC PT "6 Clicks" Mobility   Outcome Measure  Help needed turning from your back to your side while in a flat bed without using bedrails?: None Help needed moving from lying on your back to sitting on the side of a flat bed without using bedrails?: None Help needed moving to and from a bed to a chair (including a wheelchair)?: None Help needed standing up from a chair using your arms (e.g., wheelchair or bedside chair)?: None Help needed to walk in hospital room?: None Help needed climbing 3-5 steps with a railing? : None 6 Click Score: 24    End of Session Equipment Utilized During Treatment: Gait  belt Activity Tolerance: Patient tolerated treatment well Patient left: in bed;with call bell/phone within reach;with family/visitor present(sitting EOB - visitor will notify nursing if she leaves so that bed alarm can be set) Nurse Communication: Mobility status PT Visit Diagnosis: Muscle weakness (generalized) (M62.81);Difficulty in walking, not elsewhere classified (R26.2);Other symptoms and signs involving the nervous system (R29.898)     Time: 1720-1747 PT Time Calculation (min) (ACUTE ONLY): 27 min  Charges:  $Gait Training: 8-22 mins $Therapeutic Activity: 8-22 mins                     Royetta Asal, PT Acute Rehab Services Pager [phone] [facility] [phone] [facility] [phone]  Billey Chang Mindy Gali 02/28/2020, 6:01 PM

## 2020-02-29 ENCOUNTER — Other Ambulatory Visit: Payer: Self-pay

## 2020-02-29 LAB — COMPREHENSIVE METABOLIC PANEL
ALT: 26 U/L (ref 0–44)
AST: 49 U/L — ABNORMAL HIGH (ref 15–41)
Albumin: 2.6 g/dL — ABNORMAL LOW (ref 3.5–5.0)
Alkaline Phosphatase: 180 U/L — ABNORMAL HIGH (ref 38–126)
Anion gap: 10 (ref 5–15)
BUN: 46 mg/dL — ABNORMAL HIGH (ref 8–23)
CO2: 23 mmol/L (ref 22–32)
Calcium: 8.5 mg/dL — ABNORMAL LOW (ref 8.9–10.3)
Chloride: 104 mmol/L (ref 98–111)
Creatinine, Ser: 2.36 mg/dL — ABNORMAL HIGH (ref 0.61–1.24)
GFR calc Af Amer: 31 mL/min — ABNORMAL LOW (ref >60)
GFR calc non Af Amer: 27 mL/min — ABNORMAL LOW (ref >60)
Glucose, Bld: 114 mg/dL — ABNORMAL HIGH (ref 70–99)
Potassium: 3.8 mmol/L (ref 3.5–5.1)
Sodium: 137 mmol/L (ref 135–145)
Total Bilirubin: 1.8 mg/dL — ABNORMAL HIGH (ref 0.3–1.2)
Total Protein: 6.2 g/dL — ABNORMAL LOW (ref 6.5–8.1)

## 2020-02-29 MED ORDER — TORSEMIDE 20 MG PO TABS: 60.0000 mg | ORAL_TABLET | Freq: Two times a day (BID) | ORAL | 5 refills | Status: AC

## 2020-02-29 MED ORDER — ACETAMINOPHEN 500 MG PO TABS: 500.0000 mg | ORAL_TABLET | Freq: Four times a day (QID) | ORAL | 0 refills | Status: AC | PRN

## 2020-02-29 MED ORDER — LACTULOSE 10 GM/15ML PO SOLN: 20.0000 g | Freq: Two times a day (BID) | ORAL | 0 refills | Status: DC

## 2020-02-29 MED FILL — ACETAMINOPHEN EXTRA STRENGT: 500 | 25 days supply | Qty: 100 | Fill #0

## 2020-02-29 MED FILL — GENERLAC 10 GM/15 ML SOLN: 10 | 4 days supply | Qty: 236 | Fill #0

## 2020-02-29 MED FILL — TORSEMIDE 20 MG TABLET: 20 | 30 days supply | Qty: 180 | Fill #0

## 2020-02-29 NOTE — Progress Notes (Signed)
Pt alert and oriented at time of discharge. Ambulatory w/walker and has a steady gait. Pt and son verbalized understanding of discharge instructions. Pt stable at discharge, vitals sign WDL.Medication returned from pharmacy to patient. Pt had no complaints. Pt assisted to exit via wheelchair by nurse tech. Son to transport patient home via [place].

## 2020-02-29 NOTE — TOC Initial Note (Addendum)
Transition of Care [facility]) - Initial/Assessment Note    Patient Details  Name: Darren Mueller MRN: [id number] Date of Birth: 08/28/1951  Transition of Care [name] CM/SW Contact:    Marilu Favre, RN Phone Number: 02/29/2020, 11:19 AM  Clinical Narrative:     7510 Received a call from [place] with [facility], patient active with [facility] for home health. Called patient on phone. Patient wants to remain with [facility] . [place] aware.                Explained to patient son Darren Mueller , PT/OT recommendation is HHPT/OT with supervision. Provided Medicare.gov home health list. Darren Mueller wants some time before chosing home health agency. He has NCM direct number to call.   [place] aware home health does not provide 24/7 supervision. Discussed insurance does not cover a Actuary. Discussed with Darren Mueller there are agencies that he can hire PCS through , but that is private pay and family would have to arrange.  Ordered walker and 3 in 1 through [place] with [employer].   Waiting on home health agency decision.  Pam with [facility] aware. Left message for Darren Mueller   Niece Darren Mueller-[facility] ), called patient is her uncle. Explained all of above to Darren Mueller, she voiced understanding. Darren Mueller and Darren Mueller will discuss home health agency and call NCM back.  Expected Discharge Plan: Darren Mueller to Discharge: No Mueller Identified   Patient Goals and CMS Choice Patient states their goals for this hospitalization and ongoing recovery are:: to get stronger CMS Medicare.gov Compare Post Acute Care list provided to:: Patient Choice offered to / list presented to : Patient, Adult Children  Expected Discharge Plan and Services Expected Discharge Plan: [place]   Discharge Planning Services: CM Consult Post Acute Care Choice: Home Health, Durable Medical Equipment Living arrangements for the past 2 months: Single Family Home                  DME Arranged: 3-N-1, Walker rolling DME Agency: [employer] Date DME Agency Contacted: 02/29/20 Time DME Agency Contacted: 1119 Representative spoke with at DME Agency: [place]: PT, OT          Prior Living Arrangements/Services Living arrangements for the past 2 months: [place] with:: Adult Children Patient language and need for interpreter reviewed:: Yes Do you feel safe going back to the place where you live?: Yes      Need for Family Participation in Patient Care: Yes (Comment) Care giver support system in place?: Yes (comment)   Criminal Activity/Legal Involvement Pertinent to Current Situation/Hospitalization: No - Comment as needed  Activities of Daily Living Home Assistive Devices/Equipment: Eyeglasses ADL Screening (condition at time of admission) Patient's cognitive ability adequate to safely complete daily activities?: Yes Is the patient deaf or have difficulty hearing?: No Does the patient have difficulty seeing, even when wearing glasses/contacts?: No Does the patient have difficulty concentrating, remembering, or making decisions?: No Patient able to express need for assistance with ADLs?: Yes Does the patient have difficulty dressing or bathing?: No Independently performs ADLs?: Yes (appropriate for developmental age) Does the patient have difficulty walking or climbing stairs?: Yes Weakness of Legs: Both Weakness of Arms/Hands: None  Permission Sought/Granted   Permission granted to share information with : Yes, Verbal Permission Granted  Share Information with NAME: son Darren Mueller           Emotional  Assessment Appearance:: Appears stated age Attitude/Demeanor/Rapport: Engaged Affect (typically observed): Accepting Orientation: : Oriented to Self, Oriented to Place, Oriented to  Time, Oriented to Situation Alcohol / Substance Use: Not Applicable Psych Involvement: No (comment)  Admission diagnosis:  Hepatic encephalopathy  (HCC) [K72.90] Pancytopenia ([place]) [D61.818] Patient Active Problem List   Diagnosis Date Noted  . Palliative care by specialist   . Goals of care, counseling/discussion   . DNR (do not resuscitate)   . Hepatic encephalopathy ([place]) 02/25/2020  . Neck pain 02/10/2020  . Anorexia 02/10/2020  . AKI (acute kidney injury) ([place])   . Cellulitis of right lower extremity   . Acute exacerbation of CHF (congestive heart failure) ([place]) 01/30/2020  . CHF (congestive heart failure) ([place]) 01/20/2020  . Abdominal pain 08/23/2019  . Grief 08/23/2019  . Acute on chronic diastolic heart failure ([place]) 05/16/2019  . Chronic diastolic CHF (congestive heart failure) ([place]) 05/16/2019  . Calf pain 04/03/2019  . History of colonic polyps   . Polyp of cecum   . Headache 02/21/2019  . Rectal bleeding 02/13/2019  . COPD (chronic obstructive pulmonary disease) ([place]) 02/13/2019  . Acute on chronic right heart failure ([place]) 02/13/2019  . Hematochezia   . Ascites   . Dark stools   . Alcoholism ([place])   . Hyperglycemia 05/04/2018  . Seizure disorder ([place]) 02/01/2018  . Pedal edema 10/01/2017  . RUQ pain 05/06/2017  . Decreased range of motion of left elbow 04/15/2017  . Hyperlipidemia 03/17/2017  . Elbow pain, left 03/17/2017  . Right rib fracture   . Syncope 07/19/2016  . Thrombocytopenia ([place]) 06/29/2016  . Hoarseness, chronic 06/29/2016  . Preventative health care 06/29/2016  . Chest pain   . Low back pain 12/09/2015  . Cirrhosis with alcoholism ([place]) 08/08/2015  . Pancytopenia ([place]) 06/10/2015  . Dermatitis 06/10/2015  . OSA (obstructive sleep apnea) 02/13/2015  . Gout 07/30/2014  . Diarrhea 09/04/2013  . Stage 4 chronic kidney disease ([place]) 07/18/2013  . Right heart failure due to pulmonary hypertension ([place]) 06/06/2013  . Hypothyroidism 01/25/2013  . PAH (pulmonary arterial hypertension) with portal hypertension ([place]) 01/21/2013  . Allergic rhinitis 12/21/2012  . Secondary pulmonary  hypertension 12/09/2012  . Hepatic cirrhosis ([place]) 11/26/2012  . GERD (gastroesophageal reflux disease) 09/05/2011  . Hypokalemia 08/29/2011  . Colon polyps 03/26/2011  . Insomnia 03/26/2011  . Hypertension 03/22/2011  . Alcohol dependence ([place]) 03/22/2011   PCP:  Mosie Lukes, MD Pharmacy:   [place], [address] [place] [address] Phone: [phone] Fax: [place], [address] [place] [address] Phone: [phone] Fax: [place], [place] [address] [place] [address] Phone: [phone] Fax: [phone]     Social Determinants of Health (SDOH) Interventions    Readmission Risk Interventions Readmission Risk Prevention Plan 02/28/2020 01/23/2020 05/17/2019  Transportation Screening Complete Complete Complete  PCP or Specialist Appt within 3-5 Days Not Complete Complete Complete  Not Complete comments plan for rehab - -  [place] or [place] Complete Complete Complete  Social Work Consult for [place] Planning/Counseling Complete Complete Complete  Palliative Care Screening Complete Not Applicable Not Applicable  Medication Review (RN Care Manager) Referral to Pharmacy Complete Referral to Pharmacy  Some recent data might be hidden

## 2020-02-29 NOTE — Progress Notes (Signed)
Occupational Therapy Treatment Patient Details Name: Darren Mueller MRN: [id number] DOB: November 26, 1950 Today's Date: 02/29/2020    History of present illness Darren Mueller is a 69 y.o. male s/p presenting with AMS. and weakness found to have hepatic encephalopathy and involves alcoholic cirrhosis. CT head negative. PMHx: hypothyroidism; OSA not on CPAP; seizures; HLD; HTN; CAD; COPD; stage 3 CKD; alcoholic cirrhosis with ascites; h/o ETOH dependence; and chronic diastolic CHF.   OT comments  Pt making progress in therapy, demonstrating improved activity tolerance and independence with self-care and functional transfer tasks. Pt able to ambulate to/from bathroom with RW and min guard to ensure balance and safety. Pt completed toileting task x 2 with cues on hand placement and body positioning to increase safety and prevent future falls. Simulated tub/shower transfer with pt able to complete with min guard, noting 0 instances of LOB. Educated pt on fall prevention and activity pacing techniques during ADLs and transfers to increase balance and safety with fair understanding. Pt tolerated standing 1 x 10.5 min at the sink to complete grooming and sponge bathing task with supervision. Pt able to complete multi-step instructions with min cues to recall. Pt required increased time to process and complete tasks. All questions/concerns answered at this time. OT will continue to follow acutely.    Follow Up Recommendations  Home health OT;Supervision/Assistance - 24 hour    Equipment Recommendations  3 in 1 bedside commode(for use in shower)    Recommendations for Other Services      Precautions / Restrictions Precautions Precautions: Fall Restrictions Weight Bearing Restrictions: No       Mobility Bed Mobility Overal bed mobility: Needs Assistance Bed Mobility: Supine to Sit;Sit to Supine     Supine to sit: Supervision;HOB elevated Sit to supine: Supervision;HOB elevated   General bed  mobility comments: To ensure safety  Transfers Overall transfer level: Needs assistance Equipment used: Rolling walker (2 wheeled) Transfers: Sit to/from Stand Sit to Stand: Supervision         General transfer comment: To ensure balance and safety. Cues for hand placement.     Balance Overall balance assessment: Needs assistance Sitting-balance support: Feet supported Sitting balance-Leahy Scale: Good       Standing balance-Leahy Scale: Fair                             ADL either performed or assessed with clinical judgement   ADL Overall ADL's : Needs assistance/impaired     Grooming: Supervision/safety;Wash/dry hands;Wash/dry face;Oral care;Standing Grooming Details (indicate cue type and reason): While standing at the sink. Cues to recall multi-step instructions.  Upper Body Bathing: Supervision/ safety;Set up;Standing Upper Body Bathing Details (indicate cue type and reason): While standing at the sink Lower Body Bathing: Supervison/ safety Lower Body Bathing Details (indicate cue type and reason): While standing at the sink         Toilet Transfer: Min guard;Ambulation;Regular Toilet;Grab bars Toilet Transfer Details (indicate cue type and reason): Pt also able to stand at the toilet to urinate with supervision Toileting- Clothing Manipulation and Hygiene: Min guard;Sit to/from stand   Tub/ Shower Transfer: Min guard;Tub transfer;Grab bars Tub/Shower Transfer Details (indicate cue type and reason): Simulated tub/shower transfer in room with. 0 instances of LOB. Pt slightly unsteady on feet.  Functional mobility during ADLs: Min guard;Rolling walker General ADL Comments: Pt able to ambulate to/from bathroom with RW and min guard to ensure balance and safety. 0 instances of  LOB. Pt tolerated standing 10.5 min at the sink to complete grooming/hygiene tasks.      Vision       Perception     Praxis      Cognition Arousal/Alertness:  Awake/alert Behavior During Therapy: Flat affect Overall Cognitive Status: Impaired/Different from baseline Area of Impairment: Orientation;Memory;Following commands;Safety/judgement;Awareness;Problem solving                 Orientation Level: Disoriented to;Situation   Memory: Decreased short-term memory Following Commands: Follows multi-step commands inconsistently;Follows multi-step commands with increased time Safety/Judgement: Decreased awareness of safety;Decreased awareness of deficits Awareness: Emergent Problem Solving: Slow processing;Requires verbal cues General Comments: Pt A&O x 3. Pleasant and willing to participate in therapy. Pt able to follow multi-step commands requiring cues to recall and increased time to complete.         Exercises     Shoulder Instructions       General Comments No signs/symptoms of distress.     Pertinent Vitals/ Pain       Pain Assessment: No/denies pain  Home Living                                          Prior Functioning/Environment              Frequency           Progress Toward Goals  OT Goals(current goals can now be found in the care plan section)  Progress towards OT goals: Progressing toward goals  ADL Goals Pt Will Perform Grooming: with supervision;standing Pt Will Perform Lower Body Dressing: with supervision;sitting/lateral leans;sit to/from stand Pt Will Transfer to Toilet: with supervision;ambulating;bedside commode Pt/caregiver will Perform Home Exercise Program: Increased strength;Both right and left upper extremity;With Supervision Additional ADL Goal #1: Pt will consistently recall 3/3 items after different intervals of time to continue to assess memory strategies. Additional ADL Goal #2: Pt will consistently follow multi-step commands with minimal cues to attend to task.  Plan Discharge plan remains appropriate    Co-evaluation                 AM-PAC OT "6 Clicks"  Daily Activity     Outcome Measure   Help from another person eating meals?: A Little Help from another person taking care of personal grooming?: A Little Help from another person toileting, which includes using toliet, bedpan, or urinal?: A Little Help from another person bathing (including washing, rinsing, drying)?: A Little Help from another person to put on and taking off regular upper body clothing?: A Little Help from another person to put on and taking off regular lower body clothing?: A Lot 6 Click Score: 17    End of Session Equipment Utilized During Treatment: Gait belt;Rolling walker  OT Visit Diagnosis: Unsteadiness on feet (R26.81);Muscle weakness (generalized) (M62.81)   Activity Tolerance Patient tolerated treatment well   Patient Left in bed;with call bell/phone within reach;with bed alarm set;with family/visitor present   Nurse Communication Mobility status        Time: 1040-1103 OT Time Calculation (min): 23 min  Charges: OT General Charges $OT Visit: 1 Visit OT Treatments $Self Care/Home Management : 8-22 mins $Therapeutic Activity: 8-22 mins  Mauri Brooklyn OTR/L [phone]   Mauri Brooklyn 02/29/2020, 11:17 AM

## 2020-02-29 NOTE — Discharge Summary (Signed)
Physician Discharge Summary  NAHOME BUBLITZ SEG:[id number] DOB: 1951-01-15 DOA: 02/25/2020  PCP: Mosie Lukes, MD  Admit date: 02/25/2020 Discharge date: 02/29/2020  Admitted From: Home Disposition: Home  Recommendations for Outpatient Follow-up:  1. Follow up with PCP in 1-2 weeks 2. Please obtain BMP/CBC in one week 3. Please follow up on the following pending results:  Home Health: Yes Equipment/Devices: Walker  Discharge Condition: Stable CODE STATUS: DNR Diet recommendation: Cardiac  Subjective: Seen and examined.  No family present at bedside.  He is alert and oriented.  No complaints.  Brief/Interim Summary: Darren Spychalski Johnsonis an 69 y.o.malewith medical history significant ofhypothyroidism; OSA not on CPAP; seizures; HLD; HTN; CAD; COPD; stage 3 CKD; alcoholic cirrhosis with ascites; h/o ETOH dependence; and chronic diastolic CHF presented with AMS.His daughter reported that he has been very confused, disoriented, weaker than normal. He does have known alcoholic liver disease with h/o ascites. No ETOH in the lastseveralmonths.  Patient was admitted under hospital service with the diagnosis of acute hepatic encephalopathy and ammonia level of 112 which had improved to 52 just 2 days ago since he was started on medications which included lactulose. MELD/MELD-Na score is 23/24, with a mortality rate of 19.6%. He is not a candidate for liver transplant, as he stopped drinking only 2 months ago. Patient follows with Dr. Ardis Hughs.  He has severe pulmonary hypertension for which he was seen by heart failure/cardiology team and medications were adjusted and he is being discharged on dose adjusted medications per their recommendations.  He is also being discharged on torsemide 60 mg twice daily.  He was evaluated by PT OT and they recommended CIR.  He was assessed by them and was accepted as well however there was no bed availability.  He has progressed since then and now PT OT has  recommended home health.  His son is in agreement.  Home health has been arranged for him so he is going to be discharged home in stable condition.  Discharge Diagnoses:  Principal Problem:   Hepatic encephalopathy ([place]) Active Problems:   Hypertension   Alcohol dependence ([place])   Hepatic cirrhosis (HCC)   PAH (pulmonary arterial hypertension) with portal hypertension (HCC)   Stage 4 chronic kidney disease (HCC)   OSA (obstructive sleep apnea)   Pancytopenia (HCC)   Cirrhosis with alcoholism (HCC)   Thrombocytopenia (HCC)   Hyperlipidemia   Seizure disorder (HCC)   COPD (chronic obstructive pulmonary disease) (HCC)   Chronic diastolic CHF (congestive heart failure) ([place])   Goals of care, counseling/discussion   DNR (do not resuscitate)   Palliative care by specialist    Discharge Instructions   Allergies as of 02/29/2020      Reactions   Aspirin Hypertension   Nitroglycerin Other (See Comments)   Severe Headache      Medication List    STOP taking these medications   allopurinol 100 MG tablet Commonly known as: ZYLOPRIM   diphenoxylate-atropine 2.5-0.025 MG tablet Commonly known as: Lomotil   folic acid 1 MG tablet Commonly known as: FOLVITE   hyoscyamine 0.125 MG SL tablet Commonly known as: LEVSIN SL   multivitamin with minerals Tabs tablet   SUMAtriptan 50 MG tablet Commonly known as: Imitrex   thiamine 100 MG tablet     TAKE these medications   acetaminophen 500 MG tablet Commonly known as: TYLENOL Take 1 tablet (500 mg total) by mouth every 6 (six) hours as needed (for pain.). What changed: how much to take  Blood Pressure Cuff Misc Use as directed. Check blood pressure bid prn Dx:I10   Ferrous Fumarate 324 (106 Fe) MG Tabs tablet Commonly known as: Hemocyte Take 1 tablet (106 mg of iron total) by mouth daily.   lactulose 10 GM/15ML solution Commonly known as: CHRONULAC Take 30 mLs (20 g total) by mouth 2 (two) times daily.   omeprazole  40 MG capsule Commonly known as: PRILOSEC TAKE 1 CAPSULE BY MOUTH ONCE DAILY   Opsumit 10 MG tablet Generic drug: macitentan Take 1 tablet (10 mg total) by mouth daily.   Potassium Chloride ER 20 MEQ Tbcr Take 40 mEq by mouth 2 (two) times daily.   rosuvastatin 5 MG tablet Commonly known as: CRESTOR TAKE 1 TABLET DAILY What changed: when to take this   tadalafil (PAH) 20 MG tablet Commonly known as: ADCIRCA Take 2 tablets (40 mg total) by mouth daily.   torsemide 20 MG tablet Commonly known as: DEMADEX Take 3 tablets (60 mg total) by mouth 2 (two) times daily. What changed: how much to take   Uptravi 1000 MCG Tabs Generic drug: Selexipag Take 1,000 mcg by mouth 2 (two) times daily.            Durable Medical Equipment  (From admission, onward)         Start     Ordered   02/29/20 1117  For home use only DME Walker rolling  Once    Question Answer Comment  Walker: With 5 Inch Wheels   Patient needs a walker to treat with the following condition Weakness      02/29/20 1116   02/29/20 1117  For home use only DME 3 n 1  Once     02/29/20 1116         Follow-up Information    [facility] Follow up.   Specialty: Home Health Services Contact information: [address] [place] [address] [phone]        Mosie Lukes, MD Follow up in 1 week(s).   Specialty: Family Medicine Contact information: [place] [address] [place] [address] [phone]        Bensimhon, Shaune Pascal, MD .   Specialty: Cardiology Contact information: [place] [address] [phone]          Allergies  Allergen Reactions  . Aspirin Hypertension  . Nitroglycerin Other (See Comments)    Severe Headache    Consultations: Cardiology   Procedures/Studies: DG Chest 2 View  Result Date: 02/25/2020 CLINICAL DATA:  69 year old male with altered mental status. EXAM: CHEST - 2 VIEW COMPARISON:   02/02/2020 and prior chest radiographs FINDINGS: Cardiomegaly and possible mild pulmonary vascular congestion again noted. There is no evidence of focal airspace disease, pulmonary edema, suspicious pulmonary nodule/mass, pleural effusion, or pneumothorax. No acute bony abnormalities are identified. IMPRESSION: Cardiomegaly with possible mild pulmonary vascular congestion. Electronically Signed   By: Margarette Canada M.D.   On: 02/25/2020 12:26   DG Chest 2 View  Result Date: 02/02/2020 CLINICAL DATA:  Follow-up of pneumonia. EXAM: CHEST - 2 VIEW COMPARISON:  01/30/2019 FINDINGS: Midline trachea. Mild cardiomegaly, accentuated by AP portable technique on the frontal radiograph. No pleural effusion or pneumothorax. Suspect mild pulmonary venous congestion. No lobar consolidation. Loop recorder. IMPRESSION: Cardiomegaly with suspicion of mild pulmonary venous congestion. No lobar consolidation to suggest pneumonia. Electronically Signed   By: Abigail Miyamoto M.D.   On: 02/02/2020 09:14  DG Chest 2 View  Result Date: 01/30/2020 CLINICAL DATA:  Shortness of breath. Leg swelling for 1 day. EXAM: CHEST - 2 VIEW COMPARISON:  One-view chest x-ray 01/20/2020 FINDINGS: The heart is enlarged. Loop recorder is in place. Atherosclerotic changes are noted at the aortic arch. New interstitial and airspace opacities are present in the periphery of both lungs. Right greater than left basilar airspace opacities are present. A right pleural effusion is suspected. IMPRESSION: 1. Cardiomegaly without evident edema. 2. Patchy peripheral airspace opacities raises concern for infection. 3. Right greater than left basilar opacity and right pleural effusion is also concerning for infection versus is less likely atelectasis. Electronically Signed   By: San Morelle M.D.   On: 01/30/2020 16:42   CT Head Wo Contrast  Result Date: 02/25/2020 CLINICAL DATA:  69 year old male with altered mental status. EXAM: CT HEAD WITHOUT CONTRAST  TECHNIQUE: Contiguous axial images were obtained from the base of the skull through the vertex without intravenous contrast. COMPARISON:  01/24/2020 and prior CTs FINDINGS: Brain: No evidence of acute infarction, hemorrhage, hydrocephalus, extra-axial collection or mass lesion/mass effect. Atrophy and mild chronic small-vessel white matter ischemic changes again noted. Vascular: Carotid atherosclerotic calcifications are noted. Skull: Normal. Negative for fracture or focal lesion. Sinuses/Orbits: No acute finding. Other: None. IMPRESSION: 1. No evidence of acute intracranial abnormality. 2. Atrophy and chronic small-vessel white matter ischemic changes. Electronically Signed   By: Margarette Canada M.D.   On: 02/25/2020 12:28   US Abdomen Limited  Result Date: 02/27/2020 CLINICAL DATA:  Ascites EXAM: ULTRASOUND ABDOMEN LIMITED RIGHT UPPER QUADRANT COMPARISON:  01/23/2020 FINDINGS: Gallbladder: Not well distended. Wall thickening, which may be reactive. No gallstones. No sonographic Murphy sign noted by sonographer. Common bile duct: Diameter: 6 mm Liver: No focal lesion identified. Coarsening of echotexture. Increased parenchymal echogenicity. Surface contour nodularity. Portal vein is patent on color Doppler imaging with normal direction of blood flow towards the liver. Other: Small volume ascites about the liver, right lower quadrant, and left lower quadrant. Splenomegaly. IMPRESSION: Cirrhosis. Evidence of portal hypertension including mild ascites and splenomegaly. Electronically Signed   By: Macy Mis M.D.   On: 02/27/2020 15:22   CUP PACEART REMOTE DEVICE CHECK  Result Date: 02/02/2020 [facility] summary report received. Battery status OK. Normal device function. No new symptom episodes, tachy episodes, brady, or pause episodes. No new AF episodes. Monthly summary reports and ROV/PRN Kathy Breach, RN, CCDS, [employer]     Discharge Exam: Vitals:   02/29/20 0518 02/29/20 1209  BP: 112/60  102/69  Pulse: 78 81  Resp: 18 18  Temp: 98.4 F (36.9 C) 97.7 F (36.5 C)  SpO2: 97% 100%   Vitals:   02/28/20 1753 02/28/20 2254 02/29/20 0518 02/29/20 1209  BP: 102/63 114/70 112/60 102/69  Pulse: 83 94 78 81  Resp: _0 Temp: (!) 97.5 F (36.4 C) 97.8 F (36.6 C) 98.4 F (36.9 C) 97.7 F (36.5 C)  TempSrc: Oral Oral Oral Oral  SpO2: 99% 99% 97% 100%  Weight:      Height:        General: Pt is alert, awake, not in acute distress Cardiovascular: RRR, S1/S2 +, no rubs, no gallops Respiratory: CTA bilaterally, no wheezing, no rhonchi Abdominal: Soft, NT, ND, bowel sounds + Extremities: no edema, no cyanosis    The results of significant diagnostics from this hospitalization (including imaging, microbiology, ancillary and laboratory) are listed below for reference.     Microbiology:  Recent Results (from the past 240 hour(s))  Respiratory Panel by RT PCR (Flu A&B, Covid) - Nasopharyngeal Swab     Status: None   Collection Time: 02/25/20  1:19 PM   Specimen: Nasopharyngeal Swab  Result Value Ref Range Status   SARS Coronavirus 2 by RT PCR NEGATIVE NEGATIVE Final    Comment: (NOTE) SARS-CoV-2 target nucleic acids are NOT DETECTED. The SARS-CoV-2 RNA is generally detectable in upper respiratoy specimens during the acute phase of infection. The lowest concentration of SARS-CoV-2 viral copies this assay can detect is 131 copies/mL. A negative result does not preclude SARS-Cov-2 infection and should not be used as the sole basis for treatment or other patient management decisions. A negative result may occur with  improper specimen collection/handling, submission of specimen other than nasopharyngeal swab, presence of viral mutation(s) within the areas targeted by this assay, and inadequate number of viral copies (<131 copies/mL). A negative result must be combined with clinical observations, patient history, and epidemiological information. The expected result  is Negative. Fact Sheet for Patients:  PinkCheek.be Fact Sheet for Healthcare Providers:  GravelBags.it This test is not yet ap proved or cleared by the [place] FDA and  has been authorized for detection and/or diagnosis of SARS-CoV-2 by FDA under an Emergency Use Authorization (EUA). This EUA will remain  in effect (meaning this test can be used) for the duration of the COVID-19 declaration under Section 564(b)(1) of the Act, 21 U.S.C. section 360bbb-3(b)(1), unless the authorization is terminated or revoked sooner.    Influenza A by PCR NEGATIVE NEGATIVE Final   Influenza B by PCR NEGATIVE NEGATIVE Final    Comment: (NOTE) The Xpert Xpress SARS-CoV-2/FLU/RSV assay is intended as an aid in  the diagnosis of influenza from Nasopharyngeal swab specimens and  should not be used as a sole basis for treatment. Nasal washings and  aspirates are unacceptable for Xpert Xpress SARS-CoV-2/FLU/RSV  testing. Fact Sheet for Patients: PinkCheek.be Fact Sheet for Healthcare Providers: GravelBags.it This test is not yet approved or cleared by the [place] FDA and  has been authorized for detection and/or diagnosis of SARS-CoV-2 by  FDA under an Emergency Use Authorization (EUA). This EUA will remain  in effect (meaning this test can be used) for the duration of the  Covid-19 declaration under Section 564(b)(1) of the Act, 21  U.S.C. section 360bbb-3(b)(1), unless the authorization is  terminated or revoked. Performed at [facility], [place] [address], [place], [place] [address]      Labs: BNP (last 3 results) Recent Labs    01/30/20 1605 02/23/20 1120 02/25/20 1144  BNP >4,500.0* 131.2* 76.1   Basic Metabolic Panel: Recent Labs  Lab 02/25/20 1144 02/26/20 0108 02/27/20 0847 02/28/20 0811 02/29/20 0246  NA 138 138 139 137 137  K 3.7 3.6 4.2 3.3* 3.8   CL 104 105 107 106 104  CO2 21* 21* _0 GLUCOSE 127* 111* 137* 136* 114*  BUN 48* 46* 56* 51* 46*  CREATININE 2.94* 2.50* 2.68* 2.53* 2.36*  CALCIUM 9.3 9.3 9.0 8.6* 8.5*  MG  --   --   --  2.0  --    Liver Function Tests: Recent Labs  Lab 02/25/20 1144 02/26/20 0108 02/28/20 0811 02/29/20 0246  AST 83* 92* 51* 49*  ALT 36 39 26 26  ALKPHOS 271* 264* 177* 180*  BILITOT 2.7* 2.6* 2.0* 1.8*  PROT 6.8 6.7 6.2* 6.2*  ALBUMIN 3.1* 3.0* 2.6* 2.6*   No  results for input(s): LIPASE, AMYLASE in the last 168 hours. Recent Labs  Lab 02/25/20 1144 02/26/20 0108 02/28/20 0811  AMMONIA 112* 80* 54*   CBC: Recent Labs  Lab 02/25/20 1144 02/26/20 0108 02/27/20 0847 02/28/20 0811 02/28/20 1647  WBC 2.7* 3.0* 3.4* 2.6* 3.7*  NEUTROABS 1.7  --   --  1.5* 2.3  HGB 10.6* 10.5* 10.0* 9.2* 10.3*  HCT 34.3* 32.5* 32.5* 28.7* 32.7*  MCV 106.2* 101.2* 105.5* 104.0* 104.5*  PLT 26* 27* 33* 26* 30*   Cardiac Enzymes: No results for input(s): CKTOTAL, CKMB, CKMBINDEX, TROPONINI in the last 168 hours. BNP: Invalid input(s): POCBNP CBG: Recent Labs  Lab 02/25/20 1129  GLUCAP 107*   D-Dimer No results for input(s): DDIMER in the last 72 hours. Hgb A1c No results for input(s): HGBA1C in the last 72 hours. Lipid Profile No results for input(s): CHOL, HDL, LDLCALC, TRIG, CHOLHDL, LDLDIRECT in the last 72 hours. Thyroid function studies No results for input(s): TSH, T4TOTAL, T3FREE, THYROIDAB in the last 72 hours.  Invalid input(s): FREET3 Anemia work up No results for input(s): VITAMINB12, FOLATE, FERRITIN, TIBC, IRON, RETICCTPCT in the last 72 hours. Urinalysis    Component Value Date/Time   COLORURINE AMBER (A) 02/25/2020 1250   APPEARANCEUR CLEAR 02/25/2020 1250   LABSPEC 1.015 02/25/2020 1250   PHURINE 5.0 02/25/2020 1250   GLUCOSEU NEGATIVE 02/25/2020 1250   HGBUR NEGATIVE 02/25/2020 1250   BILIRUBINUR NEGATIVE 02/25/2020 1250   BILIRUBINUR neg 12/18/2017 1352    KETONESUR NEGATIVE 02/25/2020 1250   PROTEINUR NEGATIVE 02/25/2020 1250   UROBILINOGEN 0.2 12/18/2017 1352   UROBILINOGEN 0.2 10/18/2013 1247   NITRITE NEGATIVE 02/25/2020 1250   LEUKOCYTESUR NEGATIVE 02/25/2020 1250   Sepsis Labs Invalid input(s): PROCALCITONIN,  WBC,  LACTICIDVEN Microbiology Recent Results (from the past 240 hour(s))  Respiratory Panel by RT PCR (Flu A&B, Covid) - Nasopharyngeal Swab     Status: None   Collection Time: 02/25/20  1:19 PM   Specimen: Nasopharyngeal Swab  Result Value Ref Range Status   SARS Coronavirus 2 by RT PCR NEGATIVE NEGATIVE Final    Comment: (NOTE) SARS-CoV-2 target nucleic acids are NOT DETECTED. The SARS-CoV-2 RNA is generally detectable in upper respiratoy specimens during the acute phase of infection. The lowest concentration of SARS-CoV-2 viral copies this assay can detect is 131 copies/mL. A negative result does not preclude SARS-Cov-2 infection and should not be used as the sole basis for treatment or other patient management decisions. A negative result may occur with  improper specimen collection/handling, submission of specimen other than nasopharyngeal swab, presence of viral mutation(s) within the areas targeted by this assay, and inadequate number of viral copies (<131 copies/mL). A negative result must be combined with clinical observations, patient history, and epidemiological information. The expected result is Negative. Fact Sheet for Patients:  PinkCheek.be Fact Sheet for Healthcare Providers:  GravelBags.it This test is not yet ap proved or cleared by the [place] FDA and  has been authorized for detection and/or diagnosis of SARS-CoV-2 by FDA under an Emergency Use Authorization (EUA). This EUA will remain  in effect (meaning this test can be used) for the duration of the COVID-19 declaration under Section 564(b)(1) of the Act, 21 U.S.C. section  360bbb-3(b)(1), unless the authorization is terminated or revoked sooner.    Influenza A by PCR NEGATIVE NEGATIVE Final   Influenza B by PCR NEGATIVE NEGATIVE Final    Comment: (NOTE) The Xpert Xpress SARS-CoV-2/FLU/RSV assay is intended as an aid in  the diagnosis of influenza from Nasopharyngeal swab specimens and  should not be used as a sole basis for treatment. Nasal washings and  aspirates are unacceptable for Xpert Xpress SARS-CoV-2/FLU/RSV  testing. Fact Sheet for Patients: PinkCheek.be Fact Sheet for Healthcare Providers: GravelBags.it This test is not yet approved or cleared by the [place] FDA and  has been authorized for detection and/or diagnosis of SARS-CoV-2 by  FDA under an Emergency Use Authorization (EUA). This EUA will remain  in effect (meaning this test can be used) for the duration of the  Covid-19 declaration under Section 564(b)(1) of the Act, 21  U.S.C. section 360bbb-3(b)(1), unless the authorization is  terminated or revoked. Performed at [facility], [place] [address] [place], [place] [address]      Time coordinating discharge: Over 30 minutes  SIGNED:   Darliss Cheney, MD  [facility] 02/29/2020, 1:34 PM  If 7PM-7AM, please contact night-coverage [email or web address]

## 2020-02-29 NOTE — Discharge Instructions (Signed)
Hepatic Encephalopathy  Hepatic encephalopathy is a loss of brain function due to advanced liver disease. When the liver is damaged, harmful substances (toxins) can build up in the body. Some of these toxins, such as ammonia, can harm the brain. The effects of the condition depend on the type of liver damage and how severe it is. In some cases, hepatic encephalopathy can be reversed. What are the causes? Certain things can trigger or worsen hepatic encephalopathy, such as:  Infection.  Constipation.  Taking certain medicines, such as benzodiazepines.  Alcohol use.  Bleeding into the intestinal tract.  Imbalances in minerals (electrolytes) in the body.  Dehydration. Hepatic encephalopathy can sometimes be reversed if these triggers are resolved. What increases the risk? You are at risk of developing this condition if you have advanced liver disease (cirrhosis). Conditions that can cause liver disease include:  Infections in the liver, such as hepatitis C.  Infections in the blood.  Drinking a lot of alcohol over a long period of time.  Taking certain medicines, including tranquilizers, diuretics, antidepressants, sleeping pills, or acetaminophen.  Genetic diseases, such as Wilson's disease. What are the signs or symptoms? Symptoms may develop suddenly. Or, they may develop slowly and get worse gradually. Symptoms can range from mild to severe. Mild symptoms include:  Mild confusion.  Shortened attention span.  Personality and mood changes.  Anxiety and agitation.  Drowsiness. Symptoms of worsening or severe hepatic encephalopathy include:  Extreme confusion (disorientation).  Slowed movement.  Slurred speech.  Extreme personality changes.  Abnormal shaking or flapping of the hands.  Coma. How is this diagnosed? This condition may be diagnosed based on:  A physical exam.  Your symptoms and medical history.  Blood tests. These may be done to check levels  of ammonia in your blood, measure how long it takes your blood to clot, or check for infection.  Liver function tests. These may be done to check how well your liver is working.  MRI and CT scans. These may be done to check for a brain disorder and to check for problems with your liver.  Electroencephalogram (EEG). This test measures the electrical activity in your brain. How is this treated? The first step in treatment is to identify and treat the cause of your liver damage or triggering illness, if possible. The next step is taking medicine to lower the level of toxins in your body and prevent ammonia from building up. Treatment will depend on how severe your encephalopathy is, and may include:  Medicine to lower your ammonia level (lactulose).  Antibiotic medicine to reduce the amount of ammonia-producing bacteria in your gut.  Close monitoring of your blood pressure, heart rate, breathing, and oxygen levels.  Removal of fluid from your abdomen.  Close monitoring of how you think, feel, and act (mental status).  Dietary changes.  Liver transplant, in severe cases. Follow these instructions at home: Eating and drinking   Work with a dietitian or with your health care provider to make sure you are getting the right balance of protein and minerals.  Drink enough fluids to keep your urine pale yellow.  Do not drink alcohol or use drugs. General instructions  If you were prescribed an antibiotic, take it as told by your health care provider. Do not stop taking the antibiotic even if your condition improves.  Take other over-the-counter and prescription medicines only as told by your health care provider.  Do not start taking any new medicines, including over-the-counter medicines, without first  checking with your health care provider.  Keep all follow-up visits as told by your health care provider. This is important. Contact a health care provider if:  You develop new  symptoms.  Your symptoms change or get worse.  You have a fever.  You are constipated. Signs of constipation include having: ? Fewer bowel movements in a week than normal. ? Trouble having a bowel movement. ? Stools that are dry, hard, or larger than normal.  You have persistent nausea, vomiting, or diarrhea. Get help right away if:  You become very confused or drowsy.  You vomit blood or material that looks like coffee grounds.  Your stool is bloody, black, or looks like tar. Summary  Hepatic encephalopathy is a loss of brain function due to advanced liver disease. When the liver is damaged, harmful substances (toxins) can build up in your body. Some of these toxins, such as ammonia, can harm your brain.  Certain things can trigger or worsen hepatic encephalopathy. Hepatic encephalopathy can sometimes be reversed if these triggers are resolved.  The first step in treatment is to identify and treat the cause of your liver damage or triggering illness, if possible. The next step is taking medicine to lower the level of toxins in your body and prevent ammonia from building up.  Your treatment will depend on how severe your hepatic encephalopathy is. This information is not intended to replace advice given to you by your health care provider. Make sure you discuss any questions you have with your health care provider. Document Revised: 10/02/2017 Document Reviewed: 07/21/2017 Elsevier Patient Education  2020 [employer].

## 2020-03-01 ENCOUNTER — Encounter (HOSPITAL_COMMUNITY): Payer: Medicare Other | Admitting: Internal Medicine

## 2020-03-01 ENCOUNTER — Telehealth: Payer: Self-pay | Admitting: *Deleted

## 2020-03-01 ENCOUNTER — Telehealth: Payer: Self-pay | Admitting: Family Medicine

## 2020-03-01 NOTE — Telephone Encounter (Signed)
1st attempt. Unable to reach patient.

## 2020-03-01 NOTE — Telephone Encounter (Signed)
Yes palliative is a good idea

## 2020-03-01 NOTE — Telephone Encounter (Signed)
Darren Mueller with [employer] is requesting for approval for Pallative care. Recommendation was made by [facility] hospital. Darren Mueller wants to know if you agree. Please advise   [phone] Opt 2

## 2020-03-02 ENCOUNTER — Encounter: Payer: Self-pay | Admitting: *Deleted

## 2020-03-02 ENCOUNTER — Other Ambulatory Visit: Payer: Self-pay | Admitting: *Deleted

## 2020-03-02 NOTE — Patient Outreach (Signed)
[facility]) Care Management  03/02/2020  Darren Mueller December 21, 1950 [id number]   Telephone Assessment-Initial assessment completed and post hospital follow up Primary Care provider office to complete the transition of care  RN spoke with the pt and his daughter and completed the initial assessment and inquired on pt's recent discharge from the hospital this week. Pt is doing well with no acute issues or events since his last hospitalization. Pt has all his medication that have been reviewed by [facility] who remain involved with PT/OT 2 X weekly and pt also attends outpatient rehabilitation twice weekly. Pt will follow up with Dr. Haroldine Laws next week and has spoken with Dr. Charlett Blake with a pending visit on Monday.  Plan of care reviewed as a reminder of the goals and interventions to start once again as pt reports his weights over the last fw days at 179-180 lbs with no 2 lbs overnight or 5 lbs within one week. Pt denies any swelling and no issues with his breathing. Currently on an outing with his daughter having lunch with no reported issues since discharge from the hospital. Mentioned pt is supposed to have Palliative Care involved but no sure from which agency. Daughter Darren Mueller) states she will inquire further on Monday with Dr. Charlett Blake office visit. No other issues or problems at this time as RN will continue to follow up in a few weeks with ongoing disease management services related to pt's HF. Will reiterate on the plan and encouraged adherence with medications, appointments and verified pt has received the HF information mailed earlier this month via [facility]. Will encourage to review prior to the next follow up in a few weeks.   Darren Mina, RN Care Management Coordinator [place] Office [phone]

## 2020-03-02 NOTE — Telephone Encounter (Signed)
Denise notified that Dr. Charlett Blake agrees with palliative care.

## 2020-03-02 NOTE — Telephone Encounter (Signed)
I have made two attempts and have been unable to reach patient. Pt has hospital follow up scheduled w/ PCP 03/05/20 _0 .

## 2020-03-03 DIAGNOSIS — I509 Heart failure, unspecified: Secondary | ICD-10-CM | POA: Diagnosis not present

## 2020-03-03 DIAGNOSIS — I5033 Acute on chronic diastolic (congestive) heart failure: Secondary | ICD-10-CM | POA: Diagnosis not present

## 2020-03-03 DIAGNOSIS — K7030 Alcoholic cirrhosis of liver without ascites: Secondary | ICD-10-CM | POA: Diagnosis not present

## 2020-03-04 LAB — CUP PACEART REMOTE DEVICE CHECK
Date Time Interrogation Session: 20210502005110
Implantable Pulse Generator Implant Date: 20180301

## 2020-03-05 ENCOUNTER — Other Ambulatory Visit: Payer: Self-pay

## 2020-03-05 ENCOUNTER — Telehealth: Payer: Self-pay

## 2020-03-05 ENCOUNTER — Ambulatory Visit: Payer: Medicare Other

## 2020-03-05 ENCOUNTER — Ambulatory Visit (INDEPENDENT_AMBULATORY_CARE_PROVIDER_SITE_OTHER): Payer: Medicare Other | Admitting: Family Medicine

## 2020-03-05 ENCOUNTER — Ambulatory Visit (INDEPENDENT_AMBULATORY_CARE_PROVIDER_SITE_OTHER): Payer: Medicare Other | Admitting: *Deleted

## 2020-03-05 VITALS — BP 108/58 | HR 88 | Temp 98.5°F | Resp 12 | Ht 71.0 in | Wt 183.8 lb

## 2020-03-05 DIAGNOSIS — K219 Gastro-esophageal reflux disease without esophagitis: Secondary | ICD-10-CM

## 2020-03-05 DIAGNOSIS — R55 Syncope and collapse: Secondary | ICD-10-CM | POA: Diagnosis not present

## 2020-03-05 DIAGNOSIS — I10 Essential (primary) hypertension: Secondary | ICD-10-CM | POA: Diagnosis not present

## 2020-03-05 DIAGNOSIS — R7989 Other specified abnormal findings of blood chemistry: Secondary | ICD-10-CM

## 2020-03-05 DIAGNOSIS — D649 Anemia, unspecified: Secondary | ICD-10-CM | POA: Diagnosis not present

## 2020-03-05 DIAGNOSIS — R600 Localized edema: Secondary | ICD-10-CM

## 2020-03-05 DIAGNOSIS — R5381 Other malaise: Secondary | ICD-10-CM | POA: Diagnosis not present

## 2020-03-05 DIAGNOSIS — N184 Chronic kidney disease, stage 4 (severe): Secondary | ICD-10-CM | POA: Diagnosis not present

## 2020-03-05 DIAGNOSIS — I50813 Acute on chronic right heart failure: Secondary | ICD-10-CM | POA: Diagnosis not present

## 2020-03-05 DIAGNOSIS — F418 Other specified anxiety disorders: Secondary | ICD-10-CM

## 2020-03-05 LAB — COMPREHENSIVE METABOLIC PANEL
ALT: 30 U/L (ref 0–53)
AST: 58 U/L — ABNORMAL HIGH (ref 0–37)
Albumin: 3.3 g/dL — ABNORMAL LOW (ref 3.5–5.2)
Alkaline Phosphatase: 255 U/L — ABNORMAL HIGH (ref 39–117)
BUN: 39 mg/dL — ABNORMAL HIGH (ref 6–23)
CO2: 23 mEq/L (ref 19–32)
Calcium: 8.2 mg/dL — ABNORMAL LOW (ref 8.4–10.5)
Chloride: 101 mEq/L (ref 96–112)
Creatinine, Ser: 2.11 mg/dL — ABNORMAL HIGH (ref 0.40–1.50)
GFR: 37.75 mL/min — ABNORMAL LOW (ref >60.00)
Glucose, Bld: 92 mg/dL (ref 70–99)
Potassium: 3.6 mEq/L (ref 3.5–5.1)
Sodium: 132 mEq/L — ABNORMAL LOW (ref 135–145)
Total Bilirubin: 1.7 mg/dL — ABNORMAL HIGH (ref 0.2–1.2)
Total Protein: 6.4 g/dL (ref 6.0–8.3)

## 2020-03-05 LAB — CBC
HCT: 29.5 % — ABNORMAL LOW (ref 39.0–52.0)
Hemoglobin: 9.9 g/dL — ABNORMAL LOW (ref 13.0–17.0)
MCHC: 33.5 g/dL (ref 30.0–36.0)
MCV: 103.3 fl — ABNORMAL HIGH (ref 78.0–100.0)
Platelets: 31 10*3/uL — CL (ref 150.0–400.0)
RBC: 2.85 Mil/uL — ABNORMAL LOW (ref 4.22–5.81)
RDW: 18.8 % — ABNORMAL HIGH (ref 11.5–15.5)
WBC: 2.8 10*3/uL — ABNORMAL LOW (ref 4.0–10.5)

## 2020-03-05 LAB — AMMONIA: Ammonia: 92 umol/L — ABNORMAL HIGH (ref 11–35)

## 2020-03-05 LAB — MAGNESIUM: Magnesium: 2.2 mg/dL (ref 1.5–2.5)

## 2020-03-05 MED ORDER — LACTULOSE 10 GM/15ML PO SOLN: 20.0000 g | Freq: Two times a day (BID) | ORAL | 5 refills | Status: AC

## 2020-03-05 MED ORDER — FLUOXETINE HCL 20 MG PO TABS: 10.0000 mg | ORAL_TABLET | Freq: Every day | ORAL | 3 refills | Status: AC

## 2020-03-05 MED ORDER — FERROUS FUMARATE 324 (106 FE) MG PO TABS: 1.0000 | ORAL_TABLET | Freq: Every day | ORAL | 5 refills | Status: AC

## 2020-03-05 MED ORDER — ALPRAZOLAM 0.25 MG PO TABS
0.2500 mg | ORAL_TABLET | Freq: Two times a day (BID) | ORAL | 0 refills | Status: DC | PRN
Start: 2020-03-05 — End: 2020-04-10

## 2020-03-05 MED FILL — FERROCITE TABLET: 324 | 30 days supply | Qty: 30 | Fill #0

## 2020-03-05 MED FILL — ALPRAZolam 0.25 MG TABS: 0.25 | 10 days supply | Qty: 20 | Fill #0

## 2020-03-05 MED FILL — FLUOXETINE HCL 20 MG TABS: 20 | 30 days supply | Qty: 30 | Fill #0

## 2020-03-05 MED FILL — GENERLAC 10 GM/15 ML SOLN: 10 | 5 days supply | Qty: 300 | Fill #0

## 2020-03-05 NOTE — Progress Notes (Signed)
Subjective:    Patient ID: Darren Mueller, male    DOB: 1951-10-06, 69 y.o.   MRN: [id number]  Chief Complaint  Patient presents with  . Hospitalization Follow-up    HPI Patient is in today for hospital follow up accompanied by his daughter. He has many concerns. He is on Torsemide 3 tabs bid but often forgets to take his later dose and then remembers late takes it and is up peeing all night. He is having anxiety and flash backs to when his parents died and they got in bed and never got out. Will not lie down in his bed and is not sleeping well. Notes anhedonia. He was hospitalized with acute liver failure and elevated ammonia levels resulting in mental status changes. The Lactulose brought the levels down and he has 3 loose BM a day. His mentation is better but his family is worried about fluctuations and driving. He is doing better with PT and would like to switch to outpatient. Denies CP/palp/HA/congestion/fevers. Taking meds as prescribed  Past Medical History:  Diagnosis Date  . Alcohol dependence in remission ([place]) 03/22/2011   In remission   . Alcoholic cirrhosis of liver with ascites ([place]) 2014  . Anginal pain ([place])    with SOB admitted 04/20/2019  . Cardiomegaly 02/2019   Noted on CXR  . CHF (congestive heart failure) ([place])   . CKD (chronic kidney disease), stage III 07/18/2013  . COPD (chronic obstructive pulmonary disease) ([employer])   . Coronary artery disease   . GERD (gastroesophageal reflux disease)   . Gout   . Hearing loss   . Hypertension   . Hypertriglyceridemia 03/17/2017  . PAH (pulmonary artery hypertension) (H[facility])    RV failure  . Pancytopenia ([place])   . Pleural effusion 2014   Noted on CT:   Moderately large simple layering right pleural effusion   . Pulmonary nodule 2014   Noted on CT: An 8 mm pulmonary nodule in the periphery of the left lower lobe  . Rectal bleeding   . Seizures ([place])   . Sleep apnea    does not wear CPAP  . Splenomegaly 12/2015   Noted  on CT  . Thoracic aorta atherosclerosis ([place]) 12/2017  . Thrombocytopenia ([place]) 06/29/2016  . Unspecified hypothyroidism 01/25/2013  . Vertigo     Past Surgical History:  Procedure Laterality Date  . CARDIAC CATHETERIZATION N/A 12/21/2015   Procedure: Right Heart Cath;  Surgeon: Jolaine Artist, MD;  Location: [place] [facility];  Service: Cardiovascular;  Laterality: N/A;  . CARDIAC CATHETERIZATION N/A 04/15/2016   Procedure: Right/Left Heart Cath and Coronary Angiography;  Surgeon: Jolaine Artist, MD;  Location: [place] [facility];  Service: Cardiovascular;  Laterality: N/A;  . COLONOSCOPY N/A 12/08/2013   Procedure: COLONOSCOPY;  Surgeon: Milus Banister, MD;  Location: [facility];  Service: Endoscopy;  Laterality: N/A;  . COLONOSCOPY WITH PROPOFOL N/A 03/24/2019   Procedure: COLONOSCOPY WITH PROPOFOL;  Surgeon: Milus Banister, MD;  Location: [facility];  Service: Endoscopy;  Laterality: N/A;  Draw CBC in preop  . COLONOSCOPY WITH PROPOFOL N/A 04/28/2019   Procedure: COLONOSCOPY WITH PROPOFOL;  Surgeon: Milus Banister, MD;  Location: [facility];  Service: Endoscopy;  Laterality: N/A;  . ESOPHAGOGASTRODUODENOSCOPY (EGD) WITH PROPOFOL N/A 03/27/2016   Procedure: ESOPHAGOGASTRODUODENOSCOPY (EGD) WITH PROPOFOL;  Surgeon: Milus Banister, MD;  Location: [place];  Service: Endoscopy;  Laterality: N/A;  . ESOPHAGOGASTRODUODENOSCOPY (EGD) WITH PROPOFOL N/A 02/14/2019   Procedure: ESOPHAGOGASTRODUODENOSCOPY (  EGD) WITH PROPOFOL;  Surgeon: Jerene Bears, MD;  Location: [place];  Service: Gastroenterology;  Laterality: N/A;  . EYE SURGERY Bilateral 03/15/2019   lasix  . GAS/FLUID EXCHANGE Right 09/22/2019   Procedure: GAS/FLUID EXCHANGE (C3F8) RIGHT EYE;  Surgeon: Sherlynn Stalls, MD;  Location: [place];  Service: Ophthalmology;  Laterality: Right;  . HEMOSTASIS CLIP PLACEMENT  04/28/2019   Procedure: HEMOSTASIS CLIP PLACEMENT;  Surgeon: Milus Banister, MD;  Location: [facility];   Service: Endoscopy;;  . LOOP RECORDER INSERTION N/A 01/01/2017   Procedure: Loop Recorder Insertion;  Surgeon: Thompson Grayer, MD;  Location: [place] [facility];  Service: Cardiovascular;  Laterality: N/A;  . PARS PLANA VITRECTOMY Right 09/22/2019   Procedure: PARS PLANA VITRECTOMY WITH 25 GAUGE RIGHT EYE;  Surgeon: Sherlynn Stalls, MD;  Location: [place];  Service: Ophthalmology;  Laterality: Right;  . PHOTOCOAGULATION WITH LASER Right 09/22/2019   Procedure: PHOTOCOAGULATION WITH LASER RIGHT EYE;  Surgeon: Sherlynn Stalls, MD;  Location: [place];  Service: Ophthalmology;  Laterality: Right;  . POLYPECTOMY  04/28/2019   Procedure: POLYPECTOMY;  Surgeon: Milus Banister, MD;  Location: [facility];  Service: Endoscopy;;  . RIGHT HEART CATHETERIZATION Right 06/06/2014   Procedure: RIGHT HEART CATH;  Surgeon: Jolaine Artist, MD;  Location: [facility];  Service: Cardiovascular;  Laterality: Right;  . UVULOPALATOPHARYNGOPLASTY  1999    Family History  Problem Relation Age of Onset  . Heart disease Mother   . Heart attack Mother   . Hypertension Mother   . Kidney failure Mother   . Liver disease Mother        stage 4 liver disease  . Prostate cancer Father   . Cancer Father        lung  . Hypertension Brother   . Gout Brother   . Alcoholism Maternal Grandfather   . Liver disease Maternal Grandfather   . Migraines Sister   . Hypertension Brother   . Colon cancer Neg Hx     Social History   Socioeconomic History  . Marital status: Divorced    Spouse name: Not on file  . Number of children: 3  . Years of education: Not on file  . Highest education level: Not on file  Occupational History  . Occupation: Retired    Comment: Social research officer, government  Tobacco Use  . Smoking status: Former Smoker    Packs/day: 2.00    Years: 5.00    Pack years: 10.00    Types: Cigarettes    Quit date: 09/22/1979    Years since quitting: 40.4  . Smokeless tobacco: Never Used  Substance and Sexual Activity  .  Alcohol use: Yes    Alcohol/week: 6.0 standard drinks    Types: 6 Shots of liquor per week    Comment: quit drinking in 12/2019  . Drug use: No  . Sexual activity: Yes    Birth control/protection: Spermicide  Other Topics Concern  . Not on file  Social History Narrative   0 caffeine drinks daily    Social Determinants of Health   Financial Resource Strain:   . Difficulty of Paying Living Expenses:   Food Insecurity:   . Worried About Charity fundraiser in the Last Year:   . Arboriculturist in the Last Year:   Transportation Needs:   . Film/video editor (Medical):   Marland Kitchen Lack of Transportation (Non-Medical):   Physical Activity:   . Days of Exercise per Week:   .  Minutes of Exercise per Session:   Stress:   . Feeling of Stress :   Social Connections:   . Frequency of Communication with Friends and Family:   . Frequency of Social Gatherings with Friends and Family:   . Attends Religious Services:   . Active Member of Clubs or Organizations:   . Attends Archivist Meetings:   Marland Kitchen Marital Status:   Intimate Partner Violence:   . Fear of Current or Ex-Partner:   . Emotionally Abused:   Marland Kitchen Physically Abused:   . Sexually Abused:     Outpatient Medications Prior to Visit  Medication Sig Dispense Refill  . acetaminophen (TYLENOL) 500 MG tablet Take 1 tablet (500 mg total) by mouth every 6 (six) hours as needed (for pain.). 30 tablet 0  . Blood Pressure Monitoring (BLOOD PRESSURE CUFF) MISC Use as directed. Check blood pressure bid prn Dx:I10 1 each 0  . macitentan (OPSUMIT) 10 MG tablet Take 1 tablet (10 mg total) by mouth daily. 90 tablet 3  . omeprazole (PRILOSEC) 40 MG capsule TAKE 1 CAPSULE BY MOUTH ONCE DAILY 90 capsule 0  . Potassium Chloride ER 20 MEQ TBCR Take 40 mEq by mouth 2 (two) times daily.    . rosuvastatin (CRESTOR) 5 MG tablet TAKE 1 TABLET DAILY 90 tablet 3  . Selexipag (UPTRAVI) 1000 MCG TABS Take 1,000 mcg by mouth 2 (two) times daily. 60 tablet  11  . tadalafil, PAH, (ADCIRCA) 20 MG tablet Take 2 tablets (40 mg total) by mouth daily. 180 tablet 1  . torsemide (DEMADEX) 20 MG tablet Take 3 tablets (60 mg total) by mouth 2 (two) times daily. 180 tablet 5  . lactulose (CHRONULAC) 10 GM/15ML solution Take 30 mLs (20 g total) by mouth 2 (two) times daily. 236 mL 0  . Ferrous Fumarate (HEMOCYTE) 324 (106 Fe) MG TABS tablet Take 1 tablet (106 mg of iron total) by mouth daily. (Patient not taking: Reported on 03/05/2020) 30 tablet 5   No facility-administered medications prior to visit.    Allergies  Allergen Reactions  . Aspirin Hypertension  . Nitroglycerin Other (See Comments)    Severe Headache    Review of Systems  Constitutional: Positive for malaise/fatigue. Negative for fever.  HENT: Negative for congestion.   Eyes: Negative for blurred vision.  Respiratory: Positive for shortness of breath.   Cardiovascular: Positive for leg swelling. Negative for chest pain and palpitations.  Gastrointestinal: Positive for diarrhea. Negative for abdominal pain, blood in stool and nausea.  Genitourinary: Positive for frequency. Negative for dysuria.  Musculoskeletal: Negative for falls.  Skin: Negative for rash.  Neurological: Positive for weakness. Negative for dizziness, loss of consciousness and headaches.  Endo/Heme/Allergies: Negative for environmental allergies.  Psychiatric/Behavioral: Negative for depression. The patient is not nervous/anxious.        Objective:    Physical Exam Vitals and nursing note reviewed.  Constitutional:      General: He is not in acute distress.    Appearance: He is well-developed.  HENT:     Head: Normocephalic and atraumatic.     Nose: Nose normal.  Eyes:     General:        Right eye: No discharge.        Left eye: No discharge.  Cardiovascular:     Rate and Rhythm: Normal rate and regular rhythm.     Heart sounds: No murmur.  Pulmonary:     Effort: Pulmonary effort is normal.  Breath  sounds: Normal breath sounds.  Abdominal:     General: Bowel sounds are normal.     Palpations: Abdomen is soft.     Tenderness: There is no abdominal tenderness.  Musculoskeletal:     Cervical back: Normal range of motion and neck supple.     Right lower leg: Edema present.     Left lower leg: Edema present.  Skin:    General: Skin is warm and dry.  Neurological:     Mental Status: He is alert and oriented to person, place, and time.     BP (!) 108/58 (BP Location: Right Arm, Cuff Size: Normal)   Pulse 88   Temp 98.5 F (36.9 C) (Temporal)   Resp 12   Ht _0  (1.803 m)   Wt 183 lb 12.8 oz (83.4 kg) Comment: with shoes  SpO2 96%   BMI 25.63 kg/m  Wt Readings from Last 3 Encounters:  03/05/20 183 lb 12.8 oz (83.4 kg)  02/25/20 192 lb 0.3 oz (87.1 kg)  02/23/20 192 lb 6.4 oz (87.3 kg)    Diabetic Foot Exam - Simple   No data filed     Lab Results  Component Value Date   WBC 3.7 (L) 02/28/2020   HGB 10.3 (L) 02/28/2020   HCT 32.7 (L) 02/28/2020   PLT 30 (L) 02/28/2020   GLUCOSE 114 (H) 02/29/2020   CHOL 145 10/11/2019   TRIG 74.0 10/11/2019   HDL 57.00 10/11/2019   LDLDIRECT 34.0 05/04/2018   LDLCALC 73 10/11/2019   ALT 26 02/29/2020   AST 49 (H) 02/29/2020   NA 137 02/29/2020   K 3.8 02/29/2020   CL 104 02/29/2020   CREATININE 2.36 (H) 02/29/2020   BUN 46 (H) 02/29/2020   CO2 23 02/29/2020   TSH 1.805 02/01/2020   PSA 0.49 11/01/2012   INR 1.2 02/26/2020   HGBA1C 4.8 04/13/2019    Lab Results  Component Value Date   TSH 1.805 02/01/2020   Lab Results  Component Value Date   WBC 3.7 (L) 02/28/2020   HGB 10.3 (L) 02/28/2020   HCT 32.7 (L) 02/28/2020   MCV 104.5 (H) 02/28/2020   PLT 30 (L) 02/28/2020   Lab Results  Component Value Date   NA 137 02/29/2020   K 3.8 02/29/2020   CO2 23 02/29/2020   GLUCOSE 114 (H) 02/29/2020   BUN 46 (H) 02/29/2020   CREATININE 2.36 (H) 02/29/2020   BILITOT 1.8 (H) 02/29/2020   ALKPHOS 180 (H) 02/29/2020    AST 49 (H) 02/29/2020   ALT 26 02/29/2020   PROT 6.2 (L) 02/29/2020   ALBUMIN 2.6 (L) 02/29/2020   CALCIUM 8.5 (L) 02/29/2020   ANIONGAP 10 02/29/2020   GFR 28.41 (L) 02/10/2020   Lab Results  Component Value Date   CHOL 145 10/11/2019   Lab Results  Component Value Date   HDL 57.00 10/11/2019   Lab Results  Component Value Date   LDLCALC 73 10/11/2019   Lab Results  Component Value Date   TRIG 74.0 10/11/2019   Lab Results  Component Value Date   CHOLHDL 3 10/11/2019   Lab Results  Component Value Date   HGBA1C 4.8 04/13/2019       Assessment & Plan:   Problem List Items Addressed This Visit    Hypertension - Primary (Chronic)    Well controlled, no changes to meds. Encouraged heart healthy diet such as the DASH diet and exercise as tolerated.  Stage 4 chronic kidney disease (HCC) (Chronic)    Continue to avoid NSAIDs and OTC preparations, hydrate and monitor      Relevant Orders   Comprehensive metabolic panel   GERD (gastroesophageal reflux disease)   Relevant Medications   lactulose (CHRONULAC) 10 GM/15ML solution   Pedal edema    He is on Torsemide 3 tabs bid but often forgets to take his later dose and then remembers late takes it and is up peeing all night. He is encouraged to take doses at 7 am and 4 pm and set alarms. If he misses til too late try dropping to 1 or 2 tabs for the late dose and monitor weight and symptoms closely      CHF (congestive heart failure) (HCC)   Relevant Orders   Magnesium   Increased ammonia level    Improved in with lactulose 30 ml bid refill given. Recheck ammonia level today. Advised not to drive due to recent hospitalization and elevated ammonia levels he is advised not to drive but he is very resistant. Advised [facility] and [facility] would allow him some level of independence still      Relevant Orders   Ammonia   Anemia    Increase leafy greens, consider increased lean red meat and using cast iron cookware.  Continue to monitor, report any concerns. Check cbc today      Relevant Orders   CBC   Debility    He would prefer to stop in home PT and go to outpatient PT       Depression with anxiety    He is having anxiety and flash backs to when his parents died and they got in bed and never got out. Will not lie down in his bed and is not sleeping well. Notes anhedonia. Started on Fluoxetine 10 mg qd x 7 days then increase to 20 mg daily as tolerated. His daughter is with him and he is allowed just #20 of Alprazolam 0.25 mg to use 1/2 to 1 tab prn severe anxiety and severe insomnia as the fluoxetine kicks in. They are advised it is habituating so with his history we have to be especially careful. His daughter will help to manage them      Relevant Medications   FLUoxetine (PROZAC) 20 MG tablet   ALPRAZolam (XANAX) 0.25 MG tablet      I am having [name] "Col" start on FLUoxetine and ALPRAZolam. I am also having him maintain his Blood Pressure Cuff, Ferrous Fumarate, Uptravi, rosuvastatin, Opsumit, omeprazole, tadalafil (PAH), Potassium Chloride ER, torsemide, acetaminophen, and lactulose.  Meds ordered this encounter  Medications  . FLUoxetine (PROZAC) 20 MG tablet    Sig: Take 0.5-1 tablets (10-20 mg total) by mouth daily.    Dispense:  30 tablet    Refill:  3  . ALPRAZolam (XANAX) 0.25 MG tablet    Sig: Take 1 tablet (0.25 mg total) by mouth 2 (two) times daily as needed for anxiety or sleep.    Dispense:  20 tablet    Refill:  0  . lactulose (CHRONULAC) 10 GM/15ML solution    Sig: Take 30 mLs (20 g total) by mouth 2 (two) times daily.    Dispense:  300 mL    Refill:  5     Penni Homans, MD

## 2020-03-05 NOTE — Assessment & Plan Note (Signed)
Increase leafy greens, consider increased lean red meat and using cast iron cookware. Continue to monitor, report any concerns. Check cbc today

## 2020-03-05 NOTE — Telephone Encounter (Signed)
Telephone call to schedule palliative care visit.  Patients daughter [name] in agreement with RN making home visit 03/12/20 at 3:00 PM.

## 2020-03-05 NOTE — Assessment & Plan Note (Signed)
Continue to avoid NSAIDs and OTC preparations, hydrate and monitor

## 2020-03-05 NOTE — Telephone Encounter (Signed)
CRITICAL VALUE STICKER  CRITICAL VALUE: Platelet 31,000   RECEIVER (on-site recipient of call): Sherice Ijames  DATE & TIME NOTIFIED: 03/05/2020 at 2:30pm  MESSENGER (representative from lab): Santiago Glad  MD NOTIFIED: Dr Charlett Blake  TIME OF NOTIFICATION: 03/05/2020

## 2020-03-05 NOTE — Assessment & Plan Note (Signed)
He is having anxiety and flash backs to when his parents died and they got in bed and never got out. Will not lie down in his bed and is not sleeping well. Notes anhedonia. Started on Fluoxetine 10 mg qd x 7 days then increase to 20 mg daily as tolerated. His daughter is with him and he is allowed just #20 of Alprazolam 0.25 mg to use 1/2 to 1 tab prn severe anxiety and severe insomnia as the fluoxetine kicks in. They are advised it is habituating so with his history we have to be especially careful. His daughter will help to manage them

## 2020-03-05 NOTE — Assessment & Plan Note (Signed)
Well controlled, no changes to meds. Encouraged heart healthy diet such as the DASH diet and exercise as tolerated.

## 2020-03-05 NOTE — Assessment & Plan Note (Signed)
He would prefer to stop in home PT and go to outpatient PT

## 2020-03-05 NOTE — Patient Instructions (Signed)
Magnesium Glycinate 400 mg at bedtime for sleep Insomnia Insomnia is a sleep disorder that makes it difficult to fall asleep or stay asleep. Insomnia can cause fatigue, low energy, difficulty concentrating, mood swings, and poor performance at work or school. There are three different ways to classify insomnia:  Difficulty falling asleep.  Difficulty staying asleep.  Waking up too early in the morning. Any type of insomnia can be long-term (chronic) or short-term (acute). Both are common. Short-term insomnia usually lasts for three months or less. Chronic insomnia occurs at least three times a week for longer than three months. What are the causes? Insomnia may be caused by another condition, situation, or substance, such as:  Anxiety.  Certain medicines.  Gastroesophageal reflux disease (GERD) or other gastrointestinal conditions.  Asthma or other breathing conditions.  Restless legs syndrome, sleep apnea, or other sleep disorders.  Chronic pain.  Menopause.  Stroke.  Abuse of alcohol, tobacco, or illegal drugs.  Mental health conditions, such as depression.  Caffeine.  Neurological disorders, such as Alzheimer's disease.  An overactive thyroid (hyperthyroidism). Sometimes, the cause of insomnia may not be known. What increases the risk? Risk factors for insomnia include:  Gender. Women are affected more often than men.  Age. Insomnia is more common as you get older.  Stress.  Lack of exercise.  Irregular work schedule or working night shifts.  Traveling between different time zones.  Certain medical and mental health conditions. What are the signs or symptoms? If you have insomnia, the main symptom is having trouble falling asleep or having trouble staying asleep. This may lead to other symptoms, such as:  Feeling fatigued or having low energy.  Feeling nervous about going to sleep.  Not feeling rested in the morning.  Having trouble  concentrating.  Feeling irritable, anxious, or depressed. How is this diagnosed? This condition may be diagnosed based on:  Your symptoms and medical history. Your health care provider may ask about: ? Your sleep habits. ? Any medical conditions you have. ? Your mental health.  A physical exam. How is this treated? Treatment for insomnia depends on the cause. Treatment may focus on treating an underlying condition that is causing insomnia. Treatment may also include:  Medicines to help you sleep.  Counseling or therapy.  Lifestyle adjustments to help you sleep better. Follow these instructions at home: Eating and drinking   Limit or avoid alcohol, caffeinated beverages, and cigarettes, especially close to bedtime. These can disrupt your sleep.  Do not eat a large meal or eat spicy foods right before bedtime. This can lead to digestive discomfort that can make it hard for you to sleep. Sleep habits   Keep a sleep diary to help you and your health care provider figure out what could be causing your insomnia. Write down: ? When you sleep. ? When you wake up during the night. ? How well you sleep. ? How rested you feel the next day. ? Any side effects of medicines you are taking. ? What you eat and drink.  Make your bedroom a dark, comfortable place where it is easy to fall asleep. ? Put up shades or blackout curtains to block light from outside. ? Use a white noise machine to block noise. ? Keep the temperature cool.  Limit screen use before bedtime. This includes: ? Watching TV. ? Using your smartphone, tablet, or computer.  Stick to a routine that includes going to bed and waking up at the same times every day and night.  This can help you fall asleep faster. Consider making a quiet activity, such as reading, part of your nighttime routine.  Try to avoid taking naps during the day so that you sleep better at night.  Get out of bed if you are still awake after 15  minutes of trying to sleep. Keep the lights down, but try reading or doing a quiet activity. When you feel sleepy, go back to bed. General instructions  Take over-the-counter and prescription medicines only as told by your health care provider.  Exercise regularly, as told by your health care provider. Avoid exercise starting several hours before bedtime.  Use relaxation techniques to manage stress. Ask your health care provider to suggest some techniques that may work well for you. These may include: ? Breathing exercises. ? Routines to release muscle tension. ? Visualizing peaceful scenes.  Make sure that you drive carefully. Avoid driving if you feel very sleepy.  Keep all follow-up visits as told by your health care provider. This is important. Contact a health care provider if:  You are tired throughout the day.  You have trouble in your daily routine due to sleepiness.  You continue to have sleep problems, or your sleep problems get worse. Get help right away if:  You have serious thoughts about hurting yourself or someone else. If you ever feel like you may hurt yourself or others, or have thoughts about taking your own life, get help right away. You can go to your nearest emergency department or call:  Your local emergency services (911 in the [place]  A suicide crisis helpline, such as the [facility] at (443)509-9916. This is open 24 hours a day. Summary  Insomnia is a sleep disorder that makes it difficult to fall asleep or stay asleep.  Insomnia can be long-term (chronic) or short-term (acute).  Treatment for insomnia depends on the cause. Treatment may focus on treating an underlying condition that is causing insomnia.  Keep a sleep diary to help you and your health care provider figure out what could be causing your insomnia. This information is not intended to replace advice given to you by your health care provider. Make sure you discuss  any questions you have with your health care provider. Document Revised: 10/02/2017 Document Reviewed: 07/30/2017 Elsevier Patient Education  2020 [employer].

## 2020-03-05 NOTE — Assessment & Plan Note (Addendum)
Improved with lactulose 30 ml bid refill given. Recheck ammonia level today. Advised not to drive due to recent hospitalization and elevated ammonia levels he is advised not to drive but he is very resistant. Advised [facility] would allow him some level of independence still

## 2020-03-05 NOTE — Telephone Encounter (Signed)
Patient daughter notified that medication has been sent in.  Also gave her Susanne Greenhouse email address to send her FMLA paperwork to [place].  She needs to be out of work starting 4/2.  Intermittent, 3 days a week.

## 2020-03-05 NOTE — Telephone Encounter (Signed)
stable

## 2020-03-05 NOTE — Assessment & Plan Note (Signed)
He is on Torsemide 3 tabs bid but often forgets to take his later dose and then remembers late takes it and is up peeing all night. He is encouraged to take doses at 7 am and 4 pm and set alarms. If he misses til too late try dropping to 1 or 2 tabs for the late dose and monitor weight and symptoms closely

## 2020-03-05 NOTE — Telephone Encounter (Signed)
Patient called in to see if Dr. Charlett Blake could send in a prescription for Ferrous Fumarate (HEMOCYTE) 324 (106 Fe) MG TABS tablet [[id number]]    Please send it to [facility] [address]  [place], [place] [address]  Phone:  [phone] Fax:  [phone]  DEA #:  --

## 2020-03-06 ENCOUNTER — Other Ambulatory Visit: Payer: Self-pay | Admitting: Emergency Medicine

## 2020-03-06 ENCOUNTER — Other Ambulatory Visit: Payer: Self-pay | Admitting: *Deleted

## 2020-03-06 ENCOUNTER — Other Ambulatory Visit (INDEPENDENT_AMBULATORY_CARE_PROVIDER_SITE_OTHER): Payer: Medicare Other

## 2020-03-06 ENCOUNTER — Ambulatory Visit: Payer: Medicare Other | Attending: Family Medicine

## 2020-03-06 DIAGNOSIS — R2689 Other abnormalities of gait and mobility: Secondary | ICD-10-CM | POA: Insufficient documentation

## 2020-03-06 DIAGNOSIS — M545 Low back pain, unspecified: Secondary | ICD-10-CM

## 2020-03-06 DIAGNOSIS — R7989 Other specified abnormal findings of blood chemistry: Secondary | ICD-10-CM

## 2020-03-06 DIAGNOSIS — R2681 Unsteadiness on feet: Secondary | ICD-10-CM | POA: Diagnosis present

## 2020-03-06 DIAGNOSIS — R293 Abnormal posture: Secondary | ICD-10-CM | POA: Insufficient documentation

## 2020-03-06 DIAGNOSIS — D649 Anemia, unspecified: Secondary | ICD-10-CM

## 2020-03-06 DIAGNOSIS — N289 Disorder of kidney and ureter, unspecified: Secondary | ICD-10-CM

## 2020-03-06 DIAGNOSIS — M25552 Pain in left hip: Secondary | ICD-10-CM

## 2020-03-06 LAB — VITAMIN D 25 HYDROXY (VIT D DEFICIENCY, FRACTURES): VITD: 43.58 ng/mL (ref 30.00–100.00)

## 2020-03-06 NOTE — Progress Notes (Signed)
[facility] Summary Report / Loop Recorder

## 2020-03-06 NOTE — Therapy (Signed)
[facility] [address]  [place], [place], [address] Phone: [phone]   Fax:  [phone]  Physical Therapy Treatment  Patient Details  Name: Darren Mueller MRN: [id number] Date of Birth: 1951/07/23 Referring Provider (PT): Mosie Lukes, MD   Encounter Date: 03/06/2020  PT End of Session - 03/06/20 1615    Visit Number  3    Number of Visits  16    Date for PT Re-Evaluation  04/16/20    Authorization Type  Medicare & Tricare for Life    PT Start Time  6962   pt. arrived late thus session time limited   PT Stop Time  1617    PT Time Calculation (min)  36 min    Activity Tolerance  Patient tolerated treatment well    Behavior During Therapy  Flat affect       Past Medical History:  Diagnosis Date  . Alcohol dependence in remission ([place]) 03/22/2011   In remission   . Alcoholic cirrhosis of liver with ascites ([place]) 2014  . Anginal pain ([place])    with SOB admitted 04/20/2019  . Cardiomegaly 02/2019   Noted on CXR  . CHF (congestive heart failure) ([place])   . CKD (chronic kidney disease), stage III 07/18/2013  . COPD (chronic obstructive pulmonary disease) ([place])   . Coronary artery disease   . GERD (gastroesophageal reflux disease)   . Gout   . Hearing loss   . Hypertension   . Hypertriglyceridemia 03/17/2017  . PAH (pulmonary artery hypertension) ([facility])    RV failure  . Pancytopenia ([place])   . Pleural effusion 2014   Noted on CT:   Moderately large simple layering right pleural effusion   . Pulmonary nodule 2014   Noted on CT: An 8 mm pulmonary nodule in the periphery of the left lower lobe  . Rectal bleeding   . Seizures ([place])   . Sleep apnea    does not wear CPAP  . Splenomegaly 12/2015   Noted on CT  . Thoracic aorta atherosclerosis ([place]) 12/2017  . Thrombocytopenia ([place]) 06/29/2016  . Unspecified hypothyroidism 01/25/2013  . Vertigo     Past Surgical History:  Procedure Laterality Date  .  CARDIAC CATHETERIZATION N/A 12/21/2015   Procedure: Right Heart Cath;  Surgeon: Jolaine Artist, MD;  Location: [facility];  Service: Cardiovascular;  Laterality: N/A;  . CARDIAC CATHETERIZATION N/A 04/15/2016   Procedure: Right/Left Heart Cath and Coronary Angiography;  Surgeon: Jolaine Artist, MD;  Location: [facility];  Service: Cardiovascular;  Laterality: N/A;  . COLONOSCOPY N/A 12/08/2013   Procedure: COLONOSCOPY;  Surgeon: Milus Banister, MD;  Location: [facility];  Service: Endoscopy;  Laterality: N/A;  . COLONOSCOPY WITH PROPOFOL N/A 03/24/2019   Procedure: COLONOSCOPY WITH PROPOFOL;  Surgeon: Milus Banister, MD;  Location: [facility];  Service: Endoscopy;  Laterality: N/A;  Draw CBC in preop  . COLONOSCOPY WITH PROPOFOL N/A 04/28/2019   Procedure: COLONOSCOPY WITH PROPOFOL;  Surgeon: Milus Banister, MD;  Location: [facility];  Service: Endoscopy;  Laterality: N/A;  . ESOPHAGOGASTRODUODENOSCOPY (EGD) WITH PROPOFOL N/A 03/27/2016   Procedure: ESOPHAGOGASTRODUODENOSCOPY (EGD) WITH PROPOFOL;  Surgeon: Milus Banister, MD;  Location: [facility];  Service: Endoscopy;  Laterality: N/A;  . ESOPHAGOGASTRODUODENOSCOPY (EGD) WITH PROPOFOL N/A 02/14/2019   Procedure: ESOPHAGOGASTRODUODENOSCOPY (EGD) WITH PROPOFOL;  Surgeon: Jerene Bears, MD;  Location: [facility];  Service: Gastroenterology;  Laterality: N/A;  .  EYE SURGERY Bilateral 03/15/2019   lasix  . GAS/FLUID EXCHANGE Right 09/22/2019   Procedure: GAS/FLUID EXCHANGE (C3F8) RIGHT EYE;  Surgeon: Sherlynn Stalls, MD;  Location: [facility];  Service: Ophthalmology;  Laterality: Right;  . HEMOSTASIS CLIP PLACEMENT  04/28/2019   Procedure: HEMOSTASIS CLIP PLACEMENT;  Surgeon: Milus Banister, MD;  Location: [facility];  Service: Endoscopy;;  . LOOP RECORDER INSERTION N/A 01/01/2017   Procedure: Loop Recorder Insertion;  Surgeon: Thompson Grayer, MD;  Location: [facility];  Service: Cardiovascular;  Laterality: N/A;   . PARS PLANA VITRECTOMY Right 09/22/2019   Procedure: PARS PLANA VITRECTOMY WITH 25 GAUGE RIGHT EYE;  Surgeon: Sherlynn Stalls, MD;  Location: [facility];  Service: Ophthalmology;  Laterality: Right;  . PHOTOCOAGULATION WITH LASER Right 09/22/2019   Procedure: PHOTOCOAGULATION WITH LASER RIGHT EYE;  Surgeon: Sherlynn Stalls, MD;  Location: [facility];  Service: Ophthalmology;  Laterality: Right;  . POLYPECTOMY  04/28/2019   Procedure: POLYPECTOMY;  Surgeon: Milus Banister, MD;  Location: [facility];  Service: Endoscopy;;  . RIGHT HEART CATHETERIZATION Right 06/06/2014   Procedure: RIGHT HEART CATH;  Surgeon: Jolaine Artist, MD;  Location: [facility] LAB;  Service: Cardiovascular;  Laterality: Right;  . UVULOPALATOPHARYNGOPLASTY  1999    There were no vitals filed for this visit.  Subjective Assessment - 03/06/20 1548    Subjective  Pt. reporting he has not had much back pain over the past few weeks.  Has not able to perform HEP.    Pertinent History  PMH significant for CHF, CAD, COPD, CKD, HTN, aortopulmonary hypertension, heart murmur, HLD, cellulitis, vertigo, seizures, gout, ETOH abuse with ETOH cirrhosis and small esophageal varices    Currently in Pain?  No/denies    Pain Score  0-No pain    Multiple Pain Sites  No                       [facility] Adult PT Treatment/Exercise - 03/06/20 0001      Neck Exercises: Seated   Other Seated Exercise  seated retraction with therapist tc 5" x 10 reps       Lumbar Exercises: Stretches   Single Knee to Chest Stretch  Right;Left;1 rep;30 seconds   cues for oppo LE straight for stretch    Single Knee to Chest Stretch Limitations  B     Piriformis Stretch  Right;Left;1 rep;30 seconds    Piriformis Stretch Limitations  KTOS-oppo LE bent on mat table       Lumbar Exercises: Seated   Other Seated Lumbar Exercises  Sitting B DF on mat table x 15 rpes     Other Seated Lumbar Exercises  Seated yellow TB row (therapist anchoring band) x 15 reps     verbal cueing for scapular retraction required     Knee/Hip Exercises: Seated   Clamshell with TheraBand  Red    Knee/Hip Flexion  x 10 rpes with pt. holding long red TB wrapped around thighs              PT Education - 03/06/20 1751    Education Details  HEP update;  scapular retraction    Person(s) Educated  Patient    Methods  Explanation;Demonstration;Verbal cues;Handout    Comprehension  Verbalized understanding;Returned demonstration;Verbal cues required       PT Short Term Goals - 02/22/20 0901      PT SHORT TERM GOAL #1   Title  Patient will be independent with initial HEP  Status  On-going    Target Date  03/12/20      PT SHORT TERM GOAL #2   Title  Patient to report L hip/low back pain reduction in frequency and intensity by >/= 25-50%    Status  On-going    Target Date  03/19/20      PT SHORT TERM GOAL #3   Title  Patient will increase gait speed to >/= 2.0 ft/sec with improved posture and gait pattern to increase safety with community ambulation    Status  On-going    Target Date  03/19/20      PT SHORT TERM GOAL #4   Title  Patient will demonstrate decreased TUG time to </= 20 sec to decrease risk for falls with transitional mobility    Status  On-going    Target Date  03/19/20        PT Long Term Goals - 02/22/20 0901      PT LONG TERM GOAL #1   Title  Patient will be independent with ongoing/advanced HEP    Status  On-going      PT LONG TERM GOAL #2   Title  Patient to report L hip/low back pain reduction in frequency and intensity by >/= 50-75%    Status  On-going      PT LONG TERM GOAL #3   Title  Patient will demonstrate improved B LE strength to >/= 4 to 4+/5 for improved stability and ease of mobility    Status  On-going      PT LONG TERM GOAL #4   Title  Patient will increase gait speed to >/= 2.62 ft/sec with good posture and normal gait pattern to increase safety with community ambulation    Status  On-going      PT LONG  TERM GOAL #5   Title  Patient will demonstrate decreased TUG time to </= 13.5 sec to decrease risk for falls with transitional mobility    Status  On-going      PT LONG TERM GOAL #6   Title  Patient will improve Berg score to >/= 47/56 to improve safety stability with ADLs in standing and reduce risk for falls    Status  On-going            Plan - 03/06/20 1600    Clinical Impression Statement  Pt. returning to therapy after absence duet to hospitalization.  Vitals WFL to start session.  Pt. denies being able to perform HEP with "everything going on", however has not been feeling pain recently.  Reviewed initial HEP today clarify proper technique with pt. requiring minor cueing for positioning with SKTC and seated clam shell with band.  pt. accompanied by son who was attentive throughout therapy session to HEP instruction.  Trialed scapular retraction, rowing with yellow TB for improved postural awareness and updated HEP.  Will continue to progress postural therex in coming sessions.    Comorbidities  PMH significant for CHF, CAD, COPD, CKD, HTN, aortopulmonary hypertension, heart murmur, HLD, cellulitis, vertigo, seizures, gout, ETOH abuse with ETOH cirrhosis and small esophageal varices    Rehab Potential  Good    PT Treatment/Interventions  ADLs/Self Care Home Management;Cryotherapy;Electrical Stimulation;Moist Heat;DME Instruction;Gait training;Stair training;Functional mobility training;Therapeutic activities;Therapeutic exercise;Balance training;Neuromuscular re-education;Patient/family education;Manual techniques;Passive range of motion;Dry needling;Taping;Joint Manipulations    PT Next Visit Plan  Monitor VS; LE/core flexibility and postural stability/strengthening    PT Home Exercise Plan  Initial WUJ:[id number] SKTC stretch, KTOS stretch, LTR, sitting clam shell with  red TB, sitting DF; 03/06/20 - scapular retraction    Consulted and Agree with Plan of Care  Patient       Patient  will benefit from skilled therapeutic intervention in order to improve the following deficits and impairments:  Abnormal gait, Cardiopulmonary status limiting activity, Decreased activity tolerance, Decreased balance, Decreased coordination, Decreased endurance, Decreased knowledge of precautions, Decreased knowledge of use of DME, Decreased mobility, Decreased range of motion, Decreased safety awareness, Decreased strength, Difficulty walking, Increased edema, Increased muscle spasms, Impaired perceived functional ability, Impaired flexibility, Improper body mechanics, Postural dysfunction, Pain  Visit Diagnosis: Unsteadiness on feet  Other abnormalities of gait and mobility  Pain in left hip  Acute left-sided low back pain without sciatica  Abnormal posture     Problem List Patient Active Problem List   Diagnosis Date Noted  . Increased ammonia level 03/05/2020  . Anemia 03/05/2020  . Debility 03/05/2020  . Depression with anxiety 03/05/2020  . Palliative care by specialist   . Goals of care, counseling/discussion   . DNR (do not resuscitate)   . Hepatic encephalopathy ([place]) 02/25/2020  . Neck pain 02/10/2020  . Anorexia 02/10/2020  . AKI (acute kidney injury) ([place])   . Cellulitis of right lower extremity   . Acute exacerbation of CHF (congestive heart failure) ([place]) 01/30/2020  . CHF (congestive heart failure) ([place]) 01/20/2020  . Abdominal pain 08/23/2019  . Grief 08/23/2019  . Acute on chronic diastolic heart failure ([place]) 05/16/2019  . Chronic diastolic CHF (congestive heart failure) ([place]) 05/16/2019  . Calf pain 04/03/2019  . History of colonic polyps   . Polyp of cecum   . Headache 02/21/2019  . Rectal bleeding 02/13/2019  . COPD (chronic obstructive pulmonary disease) ([place]) 02/13/2019  . Acute on chronic right heart failure ([place]) 02/13/2019  . Hematochezia   . Ascites   . Dark stools   . Alcoholism ([place])   . Hyperglycemia 05/04/2018  . Seizure disorder ([place])  02/01/2018  . Pedal edema 10/01/2017  . RUQ pain 05/06/2017  . Decreased range of motion of left elbow 04/15/2017  . Hyperlipidemia 03/17/2017  . Elbow pain, left 03/17/2017  . Right rib fracture   . Syncope 07/19/2016  . Thrombocytopenia ([place]) 06/29/2016  . Hoarseness, chronic 06/29/2016  . Preventative health care 06/29/2016  . Chest pain   . Low back pain 12/09/2015  . Cirrhosis with alcoholism ([place]) 08/08/2015  . Pancytopenia ([place]) 06/10/2015  . Dermatitis 06/10/2015  . OSA (obstructive sleep apnea) 02/13/2015  . Gout 07/30/2014  . Diarrhea 09/04/2013  . Stage 4 chronic kidney disease ([place]) 07/18/2013  . Right heart failure due to pulmonary hypertension ([place]) 06/06/2013  . Hypothyroidism 01/25/2013  . PAH (pulmonary arterial hypertension) with portal hypertension ([place]) 01/21/2013  . Allergic rhinitis 12/21/2012  . Secondary pulmonary hypertension 12/09/2012  . Hepatic cirrhosis ([place]) 11/26/2012  . GERD (gastroesophageal reflux disease) 09/05/2011  . Hypokalemia 08/29/2011  . Colon polyps 03/26/2011  . Insomnia 03/26/2011  . Hypertension 03/22/2011  . Alcohol dependence ([place]) 03/22/2011    Bess Harvest, PTA 03/06/20 5:59 PM   [facility] [address]  [place], [place], [address] Phone: [phone]   Fax:  [phone]  Name: Darren Mueller MRN: [id number] Date of Birth: 1951/07/19

## 2020-03-07 MED FILL — FOLIC ACID 1 MG TABS: 1 | 30 days supply | Qty: 30 | Fill #1

## 2020-03-08 ENCOUNTER — Ambulatory Visit (HOSPITAL_COMMUNITY)
Admission: RE | Admit: 2020-03-08 | Discharge: 2020-03-08 | Disposition: A | Discharge status: Home / Self Care | Payer: Medicare Other | Source: Ambulatory Visit | Attending: Internal Medicine | Admitting: Internal Medicine

## 2020-03-08 ENCOUNTER — Other Ambulatory Visit: Payer: Self-pay

## 2020-03-08 ENCOUNTER — Ambulatory Visit: Payer: Medicare Other | Admitting: Physical Therapy

## 2020-03-08 ENCOUNTER — Encounter (HOSPITAL_COMMUNITY): Payer: Self-pay

## 2020-03-08 ENCOUNTER — Encounter: Payer: Self-pay | Admitting: Physical Therapy

## 2020-03-08 VITALS — BP 116/70 | HR 82 | Wt 183.8 lb

## 2020-03-08 DIAGNOSIS — Z8249 Family history of ischemic heart disease and other diseases of the circulatory system: Secondary | ICD-10-CM | POA: Diagnosis not present

## 2020-03-08 DIAGNOSIS — J449 Chronic obstructive pulmonary disease, unspecified: Secondary | ICD-10-CM | POA: Diagnosis not present

## 2020-03-08 DIAGNOSIS — Z87891 Personal history of nicotine dependence: Secondary | ICD-10-CM | POA: Diagnosis not present

## 2020-03-08 DIAGNOSIS — I130 Hypertensive heart and chronic kidney disease with heart failure and stage 1 through stage 4 chronic kidney disease, or unspecified chronic kidney disease: Secondary | ICD-10-CM | POA: Diagnosis not present

## 2020-03-08 DIAGNOSIS — Z79899 Other long term (current) drug therapy: Secondary | ICD-10-CM | POA: Insufficient documentation

## 2020-03-08 DIAGNOSIS — I2510 Atherosclerotic heart disease of native coronary artery without angina pectoris: Secondary | ICD-10-CM | POA: Diagnosis not present

## 2020-03-08 DIAGNOSIS — K766 Portal hypertension: Secondary | ICD-10-CM | POA: Diagnosis not present

## 2020-03-08 DIAGNOSIS — R55 Syncope and collapse: Secondary | ICD-10-CM | POA: Diagnosis not present

## 2020-03-08 DIAGNOSIS — I5032 Chronic diastolic (congestive) heart failure: Secondary | ICD-10-CM

## 2020-03-08 DIAGNOSIS — R7989 Other specified abnormal findings of blood chemistry: Secondary | ICD-10-CM

## 2020-03-08 DIAGNOSIS — R519 Headache, unspecified: Secondary | ICD-10-CM | POA: Insufficient documentation

## 2020-03-08 DIAGNOSIS — E039 Hypothyroidism, unspecified: Secondary | ICD-10-CM | POA: Diagnosis not present

## 2020-03-08 DIAGNOSIS — M25552 Pain in left hip: Secondary | ICD-10-CM

## 2020-03-08 DIAGNOSIS — R2681 Unsteadiness on feet: Secondary | ICD-10-CM | POA: Diagnosis not present

## 2020-03-08 DIAGNOSIS — G4733 Obstructive sleep apnea (adult) (pediatric): Secondary | ICD-10-CM | POA: Insufficient documentation

## 2020-03-08 DIAGNOSIS — F1021 Alcohol dependence, in remission: Secondary | ICD-10-CM | POA: Diagnosis not present

## 2020-03-08 DIAGNOSIS — D61818 Other pancytopenia: Secondary | ICD-10-CM | POA: Insufficient documentation

## 2020-03-08 DIAGNOSIS — I2721 Secondary pulmonary arterial hypertension: Secondary | ICD-10-CM | POA: Diagnosis not present

## 2020-03-08 DIAGNOSIS — M545 Low back pain, unspecified: Secondary | ICD-10-CM

## 2020-03-08 DIAGNOSIS — R2689 Other abnormalities of gait and mobility: Secondary | ICD-10-CM

## 2020-03-08 DIAGNOSIS — N1831 Chronic kidney disease, stage 3a: Secondary | ICD-10-CM

## 2020-03-08 DIAGNOSIS — K219 Gastro-esophageal reflux disease without esophagitis: Secondary | ICD-10-CM | POA: Insufficient documentation

## 2020-03-08 DIAGNOSIS — R293 Abnormal posture: Secondary | ICD-10-CM

## 2020-03-08 NOTE — Therapy (Signed)
[facility] [address]  [place], [place], [address] Phone: [phone]   Fax:  [phone]  Physical Therapy Treatment  Patient Details  Name: Darren Mueller MRN: [id number] Date of Birth: 1951-10-09 Referring Provider (PT): Mosie Lukes, MD   Encounter Date: 03/08/2020  PT End of Session - 03/08/20 1532    Visit Number  4    Number of Visits  16    Date for PT Re-Evaluation  04/16/20    Authorization Type  Medicare & Tricare for Life    PT Start Time  1532    PT Stop Time  1616    PT Time Calculation (min)  44 min    Activity Tolerance  Patient tolerated treatment well    Behavior During Therapy  [facility] for tasks assessed/performed       Past Medical History:  Diagnosis Date  . Alcohol dependence in remission ([place]) 03/22/2011   In remission   . Alcoholic cirrhosis of liver with ascites ([place]) 2014  . Anginal pain ([place])    with SOB admitted 04/20/2019  . Cardiomegaly 02/2019   Noted on CXR  . CHF (congestive heart failure) ([place])   . CKD (chronic kidney disease), stage III 07/18/2013  . COPD (chronic obstructive pulmonary disease) ([place])   . Coronary artery disease   . GERD (gastroesophageal reflux disease)   . Gout   . Hearing loss   . Hypertension   . Hypertriglyceridemia 03/17/2017  . PAH (pulmonary artery hypertension) ([facility])    RV failure  . Pancytopenia ([place])   . Pleural effusion 2014   Noted on CT:   Moderately large simple layering right pleural effusion   . Pulmonary nodule 2014   Noted on CT: An 8 mm pulmonary nodule in the periphery of the left lower lobe  . Rectal bleeding   . Seizures ([place])   . Sleep apnea    does not wear CPAP  . Splenomegaly 12/2015   Noted on CT  . Thoracic aorta atherosclerosis ([place]) 12/2017  . Thrombocytopenia ([place]) 06/29/2016  . Unspecified hypothyroidism 01/25/2013  . Vertigo     Past Surgical History:  Procedure Laterality Date  . CARDIAC CATHETERIZATION N/A  12/21/2015   Procedure: Right Heart Cath;  Surgeon: Jolaine Artist, MD;  Location: [facility];  Service: Cardiovascular;  Laterality: N/A;  . CARDIAC CATHETERIZATION N/A 04/15/2016   Procedure: Right/Left Heart Cath and Coronary Angiography;  Surgeon: Jolaine Artist, MD;  Location: [facility];  Service: Cardiovascular;  Laterality: N/A;  . COLONOSCOPY N/A 12/08/2013   Procedure: COLONOSCOPY;  Surgeon: Milus Banister, MD;  Location: [facility];  Service: Endoscopy;  Laterality: N/A;  . COLONOSCOPY WITH PROPOFOL N/A 03/24/2019   Procedure: COLONOSCOPY WITH PROPOFOL;  Surgeon: Milus Banister, MD;  Location: [facility];  Service: Endoscopy;  Laterality: N/A;  Draw CBC in preop  . COLONOSCOPY WITH PROPOFOL N/A 04/28/2019   Procedure: COLONOSCOPY WITH PROPOFOL;  Surgeon: Milus Banister, MD;  Location: [facility];  Service: Endoscopy;  Laterality: N/A;  . ESOPHAGOGASTRODUODENOSCOPY (EGD) WITH PROPOFOL N/A 03/27/2016   Procedure: ESOPHAGOGASTRODUODENOSCOPY (EGD) WITH PROPOFOL;  Surgeon: Milus Banister, MD;  Location: [facility];  Service: Endoscopy;  Laterality: N/A;  . ESOPHAGOGASTRODUODENOSCOPY (EGD) WITH PROPOFOL N/A 02/14/2019   Procedure: ESOPHAGOGASTRODUODENOSCOPY (EGD) WITH PROPOFOL;  Surgeon: Jerene Bears, MD;  Location: [facility];  Service: Gastroenterology;  Laterality: N/A;  . EYE SURGERY Bilateral 03/15/2019  lasix  . GAS/FLUID EXCHANGE Right 09/22/2019   Procedure: GAS/FLUID EXCHANGE (C3F8) RIGHT EYE;  Surgeon: Sherlynn Stalls, MD;  Location: [facility];  Service: Ophthalmology;  Laterality: Right;  . HEMOSTASIS CLIP PLACEMENT  04/28/2019   Procedure: HEMOSTASIS CLIP PLACEMENT;  Surgeon: Milus Banister, MD;  Location: [facility];  Service: Endoscopy;;  . LOOP RECORDER INSERTION N/A 01/01/2017   Procedure: Loop Recorder Insertion;  Surgeon: Thompson Grayer, MD;  Location: [facility];  Service: Cardiovascular;  Laterality: N/A;  . PARS PLANA VITRECTOMY  Right 09/22/2019   Procedure: PARS PLANA VITRECTOMY WITH 25 GAUGE RIGHT EYE;  Surgeon: Sherlynn Stalls, MD;  Location: [facility];  Service: Ophthalmology;  Laterality: Right;  . PHOTOCOAGULATION WITH LASER Right 09/22/2019   Procedure: PHOTOCOAGULATION WITH LASER RIGHT EYE;  Surgeon: Sherlynn Stalls, MD;  Location: [facility];  Service: Ophthalmology;  Laterality: Right;  . POLYPECTOMY  04/28/2019   Procedure: POLYPECTOMY;  Surgeon: Milus Banister, MD;  Location: [facility];  Service: Endoscopy;;  . RIGHT HEART CATHETERIZATION Right 06/06/2014   Procedure: RIGHT HEART CATH;  Surgeon: Jolaine Artist, MD;  [facility];  Service: Cardiovascular;  Laterality: Right;  . UVULOPALATOPHARYNGOPLASTY  1999    There were no vitals filed for this visit.  Subjective Assessment - 03/08/20 1538    Subjective  Pt reports he is feeling better since being home from the hospital.    Pertinent History  PMH significant for CHF, CAD, COPD, CKD, HTN, aortopulmonary hypertension, heart murmur, HLD, cellulitis, vertigo, seizures, gout, ETOH abuse with ETOH cirrhosis and small esophageal varices    Currently in Pain?  No/denies                       [facility] Adult PT Treatment/Exercise - 03/08/20 1532      Lumbar Exercises: Stretches   Single Knee to Chest Stretch  Right;Left;30 seconds;1 rep    Single Knee to Chest Stretch Limitations  opp LE straight    Piriformis Stretch  Right;Left;30 seconds;1 rep    Piriformis Stretch Limitations  supine KTOS from hooklying      Lumbar Exercises: Aerobic   Nustep  L3 x 6 min (UE/LE)      Lumbar Exercises: Standing   Scapular Retraction  Both;5 reps;AROM    Scapular Retraction Limitations  postural correction with back to wall pressing shoulders and head back to wall while retracting scapulae    Row  Both;10 reps;Strengthening;Theraband    Theraband Level (Row)  Level 2 (Red)    Row Limitations  VC & TC for upright posture with abd bracing, cervical  and scapular retraction    Shoulder Extension  Both;10 reps;Strengthening;Theraband    Theraband Level (Shoulder Extension)  Level 2 (Red)    Shoulder Extension Limitations  VC & TC for upright posture with abd bracing, cervical and scapular retraction      Lumbar Exercises: Seated   Other Seated Lumbar Exercises  Seated heel/toe raises x 15    Other Seated Lumbar Exercises  Cervical & scapular retraction 10 x 5" - cues to avoid shoulder shrug      Lumbar Exercises: Supine   Pelvic Tilt  10 reps;5 seconds    Clam  10 reps;3 seconds    Clam Limitations  abd bracing + alt red TB ABD/ER    Bent Knee Raise  10 reps;3 seconds    Bent Knee Raise Limitations  red TB brace marching    Bridge  10 reps;3  seconds    Bridge Limitations  + red TB hip ABD isometric    limited lift     Knee/Hip Exercises: Seated   Clamshell with TheraBand  Red   B x 10              PT Short Term Goals - 02/22/20 0901      PT SHORT TERM GOAL #1   Title  Patient will be independent with initial HEP    Status  On-going    Target Date  03/12/20      PT SHORT TERM GOAL #2   Title  Patient to report L hip/low back pain reduction in frequency and intensity by >/= 25-50%    Status  On-going    Target Date  03/19/20      PT SHORT TERM GOAL #3   Title  Patient will increase gait speed to >/= 2.0 ft/sec with improved posture and gait pattern to increase safety with community ambulation    Status  On-going    Target Date  03/19/20      PT SHORT TERM GOAL #4   Title  Patient will demonstrate decreased TUG time to </= 20 sec to decrease risk for falls with transitional mobility    Status  On-going    Target Date  03/19/20        PT Long Term Goals - 02/22/20 0901      PT LONG TERM GOAL #1   Title  Patient will be independent with ongoing/advanced HEP    Status  On-going      PT LONG TERM GOAL #2   Title  Patient to report L hip/low back pain reduction in frequency and intensity by >/= 50-75%     Status  On-going      PT LONG TERM GOAL #3   Title  Patient will demonstrate improved B LE strength to >/= 4 to 4+/5 for improved stability and ease of mobility    Status  On-going      PT LONG TERM GOAL #4   Title  Patient will increase gait speed to >/= 2.62 ft/sec with good posture and normal gait pattern to increase safety with community ambulation    Status  On-going      PT LONG TERM GOAL #5   Title  Patient will demonstrate decreased TUG time to </= 13.5 sec to decrease risk for falls with transitional mobility    Status  On-going      PT LONG TERM GOAL #6   Title  Patient will improve Berg score to >/= 47/56 to improve safety stability with ADLs in standing and reduce risk for falls    Status  On-going            Plan - 03/08/20 1539    Clinical Impression Statement  Darren Mueller reports limited opportunity to attempt HEP since last visit due to busy with MD appointments the past 2 days and requested PT review exercises again today - clarification necessary for some motions and hold times but able to provide good return demonstration.  Progressed core/lumbopelvic strengthening/stabilization in supine and upright positions today adding emphasis on cervical and scapular retraction for improved posture and head control. All exercises well tolerated but deferred updates to HEP until pt has a chance to get comfortable with initial HEP.    Personal Factors and Comorbidities  Comorbidity 3+;Past/Current Experience;Time since onset of injury/illness/exacerbation;Age;Fitness    Comorbidities  PMH significant for CHF, CAD, COPD, CKD, HTN, aortopulmonary  hypertension, heart murmur, HLD, cellulitis, vertigo, seizures, gout, ETOH abuse with ETOH cirrhosis and small esophageal varices    Examination-Activity Limitations  Bend;Lift;Locomotion Level;Sit;Squat;Stand    Examination-Participation Restrictions  Cleaning;Community Activity;Laundry;Meal Prep    Rehab Potential  Good    PT Frequency  2x  / week    PT Duration  8 weeks    PT Treatment/Interventions  ADLs/Self Care Home Management;Cryotherapy;Electrical Stimulation;Moist Heat;DME Instruction;Gait training;Stair training;Functional mobility training;Therapeutic activities;Therapeutic exercise;Balance training;Neuromuscular re-education;Patient/family education;Manual techniques;Passive range of motion;Dry needling;Taping;Joint Manipulations    PT Next Visit Plan  Monitor VS; LE/core flexibility and postural stability/strengthening    PT Home Exercise Plan  Initial HEP (02/22/20) - Hooklying SKTC stretch, KTOS stretch, LTR, sitting clam shell with red TB, sitting DF; 03/06/20 - scapular retraction    Consulted and Agree with Plan of Care  Patient       Patient will benefit from skilled therapeutic intervention in order to improve the following deficits and impairments:  Abnormal gait, Cardiopulmonary status limiting activity, Decreased activity tolerance, Decreased balance, Decreased coordination, Decreased endurance, Decreased knowledge of precautions, Decreased knowledge of use of DME, Decreased mobility, Decreased range of motion, Decreased safety awareness, Decreased strength, Difficulty walking, Increased edema, Increased muscle spasms, Impaired perceived functional ability, Impaired flexibility, Improper body mechanics, Postural dysfunction, Pain  Visit Diagnosis: Unsteadiness on feet  Other abnormalities of gait and mobility  Pain in left hip  Acute left-sided low back pain without sciatica  Abnormal posture     Problem List Patient Active Problem List   Diagnosis Date Noted  . Increased ammonia level 03/05/2020  . Anemia 03/05/2020  . Debility 03/05/2020  . Depression with anxiety 03/05/2020  . Palliative care by specialist   . Goals of care, counseling/discussion   . DNR (do not resuscitate)   . Hepatic encephalopathy ([place]) 02/25/2020  . Neck pain 02/10/2020  . Anorexia 02/10/2020  . AKI (acute kidney injury)  ([place])   . Cellulitis of right lower extremity   . Acute exacerbation of CHF (congestive heart failure) ([place]) 01/30/2020  . CHF (congestive heart failure) ([place]) 01/20/2020  . Abdominal pain 08/23/2019  . Grief 08/23/2019  . Acute on chronic diastolic heart failure ([place]) 05/16/2019  . Chronic diastolic CHF (congestive heart failure) ([place]) 05/16/2019  . Calf pain 04/03/2019  . History of colonic polyps   . Polyp of cecum   . Headache 02/21/2019  . Rectal bleeding 02/13/2019  . COPD (chronic obstructive pulmonary disease) ([place]) 02/13/2019  . Acute on chronic right heart failure ([place]) 02/13/2019  . Hematochezia   . Ascites   . Dark stools   . Alcoholism ([place])   . Hyperglycemia 05/04/2018  . Seizure disorder ([place]) 02/01/2018  . Pedal edema 10/01/2017  . RUQ pain 05/06/2017  . Decreased range of motion of left elbow 04/15/2017  . Hyperlipidemia 03/17/2017  . Elbow pain, left 03/17/2017  . Right rib fracture   . Syncope 07/19/2016  . Thrombocytopenia ([place]) 06/29/2016  . Hoarseness, chronic 06/29/2016  . Preventative health care 06/29/2016  . Chest pain   . Low back pain 12/09/2015  . Cirrhosis with alcoholism ([place]) 08/08/2015  . Pancytopenia ([place]) 06/10/2015  . Dermatitis 06/10/2015  . OSA (obstructive sleep apnea) 02/13/2015  . Gout 07/30/2014  . Diarrhea 09/04/2013  . Stage 4 chronic kidney disease ([place]) 07/18/2013  . Right heart failure due to pulmonary hypertension ([place]) 06/06/2013  . Hypothyroidism 01/25/2013  . PAH (pulmonary arterial hypertension) with portal hypertension ([place]) 01/21/2013  . Allergic rhinitis 12/21/2012  .  Secondary pulmonary hypertension 12/09/2012  . Hepatic cirrhosis ([place]) 11/26/2012  . GERD (gastroesophageal reflux disease) 09/05/2011  . Hypokalemia 08/29/2011  . Colon polyps 03/26/2011  . Insomnia 03/26/2011  . Hypertension 03/22/2011  . Alcohol dependence ([place]) 03/22/2011    Percival Spanish, PT, MPT 03/08/2020, 4:39 PM  [facility] [address]  [place], [place], [address] Phone: [phone]   Fax:  [phone]  Name: Darren Mueller MRN: [id number] Date of Birth: 1951/08/26

## 2020-03-08 NOTE — Patient Instructions (Addendum)
No medication changes today!  Your physician recommends that you schedule a follow-up appointment in: 2 weeks with the Nurse Practitioner and 2 months with Dr Haroldine Laws  [address] code [address]  Please call office at [phone] option 2 if you have any questions or concerns.   At the [facility], you and your health needs are our priority. As part of our continuing mission to provide you with exceptional heart care, we have created designated Provider Care Teams. These Care Teams include your primary Cardiologist (physician) and Advanced Practice Providers (APPs- Physician Assistants and Nurse Practitioners) who all work together to provide you with the care you need, when you need it.   You may see any of the following providers on your designated Care Team at your next follow up: Marland Kitchen Dr Glori Bickers . Dr Loralie Champagne . Darrick Grinder, NP . [name], PA . Audry Riles, PharmD   Please be sure to bring in all your medications bottles to every appointment.

## 2020-03-08 NOTE — Progress Notes (Signed)
PCP: Dr Charlett Blake Primary Cardiologist: Dr Haroldine Laws   HPI: Mr Darren Mueller is a 69  year old with a history of COPD, PAH, RV failure, cirrhosis, and ETOH abuse.    Admitted in 6/17 with CP and SOB. Underwent R/L cath. Minimal CAD with moderate PAH and preserved cardiac output.   Admitted 06/2016 and 07/2016 with syncope. On September admission found to have elevated ETOH level as well as R 6th rib fracture. Wore 30 day monitor up until 08/28/16. No AF noted. No events.   Admitted February2018after syncopal event. LINQ placed.   Was admitted 4/20 with hematochezia and melena. His hemoglobin remained stable. No episodes of GI bleed after hospitalization. Underwent EGD by GI with finding of Grade 1 and a small esophageal varices, no evidence of recent bleeding,portal hypertensive gastropathy. Started on nadolol which made him very dizzy. He stopped this and feels a lot  Admitted 7/13-16/20 with volume overload. Diuresed 21 pounds. D/c weight 192.  Echo 05/17/19: LVEF >65% RV severely dilated with moderate RV dysfunction. Moderate TR RVSP 83mHG. While in hospital was pancytopenic. Possibility of BMBx discussed with Dr. SAlen Blewbut felt it was not necessary as it was felt he had splenic sequestration and counts were low but very stable.  On triple therapy for P[place]ptravi Macitentan  Adcirca   Admitted in March with increased shortness of breath. Diuresed with IV lasix and later transitioned to torsemide 60 mg twice a day. Hospital course complicated by AKI. Discharged on 01/26/20 with weight 199 pounds.    [facility] edema. Weight has gone  up 20 pounds since discharge.Says he has been taking all medications.  Admitted with cellulitis and A/C RV failure. Diuresed with IV lasix and transitioned to torsemide.  Discharge weight 184 pounds.   Readmitted 02/25/20 with AMS. Ammonia was high, 112. Improved with lactulose.    [facility] Red Clip  Readings from [facility]  02/23/20 37%, 02/24/20 33%, and 03/01/20 32%.   Today he returns for HF follow up.Overall feeling a little better. Walking without difficulty. Denies SOB/PND/Orthopnea. No presyncope/syncope. Appetite ok. No fever or chills. Weight at home 181-185  pounds. Taking all medications. His son lives with him and has been helping with medications and transportation to appointments. Followed by [facility]. Completed [facility] visits.   Studies: ECHO 12/14 EF 55-60% Peak PA pressure 35. Severe RV dysfunction  ECHO 7/15 EF 60% RV moderately to severely dilated. Moderate HK RVSP 511mHG  ECHO 6/16 EF 60% RV moderately to severely dilated. Severe HK RVSP ~65 mm HG. D-shaped septum Echo 2/17 LVEF 60-65% RV massively dilated. Flat septum. Severe HK. Moderate TR RVSP ~65. IVC small. No effusion  ECHO 01/01/2017 EF 50-55% Grade I DD. RV severely dilated. Peak PA pressure 62 mm hg Echo 9/19 with EF 60-65% RV markedly dilated and hypokinetic with D-shaped septum. RVSP 6884m Echo 01/2020 EF 60-65% Grade IIDD, RVSP 83   ROS: All systems negative except as listed in HPI, PMH and Problem List.  SH:  Social History   Socioeconomic History  . Marital status: Divorced    Spouse name: Not on file  . Number of children: 3  . Years of education: Not on file  . Highest education level: Not on file  Occupational History  . Occupation: Retired    Comment: AirSocial research officer, governmentobacco Use  . Smoking status: Former Smoker    Packs/day: 2.00    Years: 5.00    Pack  years: 10.00    Types: Cigarettes    Quit date: 09/22/1979    Years since quitting: 40.4  . Smokeless tobacco: Never Used  Substance and Sexual Activity  . Alcohol use: Yes    Alcohol/week: 6.0 standard drinks    Types: 6 Shots of liquor per week    Comment: quit drinking in 12/2019  . Drug use: No  . Sexual activity: Yes    Birth control/protection: Spermicide  Other Topics Concern  . Not on file  Social History Narrative   0  caffeine drinks daily    Social Determinants of Health   Financial Resource Strain:   . Difficulty of Paying Living Expenses:   Food Insecurity:   . Worried About Charity fundraiser in the Last Year:   . Arboriculturist in the Last Year:   Transportation Needs:   . Film/video editor (Medical):   Marland Kitchen Lack of Transportation (Non-Medical):   Physical Activity:   . Days of Exercise per Week:   . Minutes of Exercise per Session:   Stress:   . Feeling of Stress :   Social Connections:   . Frequency of Communication with Friends and Family:   . Frequency of Social Gatherings with Friends and Family:   . Attends Religious Services:   . Active Member of Clubs or Organizations:   . Attends Archivist Meetings:   Marland Kitchen Marital Status:   Intimate Partner Violence:   . Fear of Current or Ex-Partner:   . Emotionally Abused:   Marland Kitchen Physically Abused:   . Sexually Abused:     FH:  Family History  Problem Relation Age of Onset  . Heart disease Mother   . Heart attack Mother   . Hypertension Mother   . Kidney failure Mother   . Liver disease Mother        stage 4 liver disease  . Prostate cancer Father   . Cancer Father        lung  . Hypertension Brother   . Gout Brother   . Alcoholism Maternal Grandfather   . Liver disease Maternal Grandfather   . Migraines Sister   . Hypertension Brother   . Colon cancer Neg Hx     Past Medical History:  Diagnosis Date  . Alcohol dependence in remission ([place]) 03/22/2011   In remission   . Alcoholic cirrhosis of liver with ascites ([place]) 2014  . Anginal pain ([place])    with SOB admitted 04/20/2019  . Cardiomegaly 02/2019   Noted on CXR  . CHF (congestive heart failure) ([place])   . CKD (chronic kidney disease), stage III 07/18/2013  . COPD (chronic obstructive pulmonary disease) ([place])   . Coronary artery disease   . GERD (gastroesophageal reflux disease)   . Gout   . Hearing loss   . Hypertension   . Hypertriglyceridemia 03/17/2017    . PAH (pulmonary artery hypertension) (HCC)    RV failure  . Pancytopenia ([place])   . Pleural effusion 2014   Noted on CT:   Moderately large simple layering right pleural effusion   . Pulmonary nodule 2014   Noted on CT: An 8 mm pulmonary nodule in the periphery of the left lower lobe  . Rectal bleeding   . Seizures ([place])   . Sleep apnea    does not wear CPAP  . Splenomegaly 12/2015   Noted on CT  . Thoracic aorta atherosclerosis ([place]) 12/2017  . Thrombocytopenia ([place]) 06/29/2016  .  Unspecified hypothyroidism 01/25/2013  . Vertigo     Current Outpatient Medications  Medication Sig Dispense Refill  . acetaminophen (TYLENOL) 500 MG tablet Take 1 tablet (500 mg total) by mouth every 6 (six) hours as needed (for pain.). 30 tablet 0  . ALPRAZolam (XANAX) 0.25 MG tablet Take 1 tablet (0.25 mg total) by mouth 2 (two) times daily as needed for anxiety or sleep. 20 tablet 0  . Blood Pressure Monitoring (BLOOD PRESSURE CUFF) MISC Use as directed. Check blood pressure bid prn Dx:I10 1 each 0  . Ferrous Fumarate (HEMOCYTE) 324 (106 Fe) MG TABS tablet Take 1 tablet (106 mg of iron total) by mouth daily. 30 tablet 5  . FLUoxetine (PROZAC) 20 MG tablet Take 0.5-1 tablets (10-20 mg total) by mouth daily. 30 tablet 3  . lactulose (CHRONULAC) 10 GM/15ML solution Take 30 mLs (20 g total) by mouth 2 (two) times daily. 300 mL 5  . macitentan (OPSUMIT) 10 MG tablet Take 1 tablet (10 mg total) by mouth daily. 90 tablet 3  . omeprazole (PRILOSEC) 40 MG capsule TAKE 1 CAPSULE BY MOUTH ONCE DAILY 90 capsule 0  . Potassium Chloride ER 20 MEQ TBCR Take 40 mEq by mouth 2 (two) times daily.    . rosuvastatin (CRESTOR) 5 MG tablet TAKE 1 TABLET DAILY 90 tablet 3  . Selexipag (UPTRAVI) 1000 MCG TABS Take 1,000 mcg by mouth 2 (two) times daily. 60 tablet 11  . tadalafil, PAH, (ADCIRCA) 20 MG tablet Take 2 tablets (40 mg total) by mouth daily. 180 tablet 1  . torsemide (DEMADEX) 20 MG tablet Take 3 tablets (60 mg  total) by mouth 2 (two) times daily. 180 tablet 5   No current facility-administered medications for this encounter.    Vitals:   03/08/20 1011  BP: 116/70  Pulse: 82  SpO2: 100%  Weight: 83.4 kg (183 lb 12.8 oz)   Wt Readings from Last 3 Encounters:  03/08/20 83.4 kg (183 lb 12.8 oz)  03/05/20 83.4 kg (183 lb 12.8 oz)  02/25/20 87.1 kg (192 lb 0.3 oz)   PHYSICAL EXAM: General:  Walked in the clinic with his son.  No resp difficulty HEENT: normal Neck: supple. JVP 6-7  Carotids 2+ bilat; no bruits. No lymphadenopathy or thryomegaly appreciated. Cor: PMI nondisplaced. Regular rate & rhythm. No rubs, gallops or murmurs. Lungs: clear Abdomen: soft, nontender, nondistended. No hepatosplenomegaly. No bruits or masses. Good bowel sounds. Extremities: no cyanosis, clubbing, rash, R and LLE trace edema Neuro: alert & orientedx3, cranial nerves grossly intact. moves all 4 extremities w/o difficulty. Affect pleasant   ASSESSMENT & PLAN:  1. Chronic Diastolic Heart Failure  ECHO 01/2020 EF 60-65% Grade IIDD NYHA III. Volume status stable.  - Continue current dose of torsemide.   2. PAH ECHO 01/2020 EF 60-65% RVSP 83 Grade II DD On triple therapy- selexipag 1000 mcg twice a day. Intolerant higher dose due to diarrhea and headaches . - Continue macitentan 10 daily and adcirca 40 mg daily.  3. CKD Stage IIIa  I reviewed BMET from 03/05/20. Stable.   4. Headaches  HAs could definitely come from selexipag but would be unusual to get worse without changing dose. - he has chronic pancytopenia due to liver disease (followed by Dr. Alen Blew) - No recent issues.   5.  Recurrent syncope/seizure - LINQ interrogations have shown no arrhythmias and thus most likely seizure - Continue Keppra. Follows with Dr. Delice Lesch  6. OSA:  - Has mild OSA with AHI 12  and desats down to 82%.  - Intolerant CPAP.  7. Pancytopenia:  - Has seen Dr. Alen Blew in Hematology   8. Elevated Ammonia Continue  lactulose.  Ammonia level 92 on 5/3 21  PCP following.   Follow up in 2 weeks and 8 weeks with Dr Haroldine Laws.     Stephan Nelis NP-C  10:16 AM

## 2020-03-09 ENCOUNTER — Other Ambulatory Visit (HOSPITAL_COMMUNITY): Payer: Self-pay | Admitting: Internal Medicine

## 2020-03-09 DIAGNOSIS — I509 Heart failure, unspecified: Secondary | ICD-10-CM | POA: Diagnosis not present

## 2020-03-12 ENCOUNTER — Ambulatory Visit: Payer: Medicare Other | Admitting: Physical Therapy

## 2020-03-12 ENCOUNTER — Encounter: Payer: Self-pay | Admitting: Physical Therapy

## 2020-03-12 ENCOUNTER — Encounter: Payer: Self-pay | Admitting: Family Medicine

## 2020-03-12 ENCOUNTER — Other Ambulatory Visit: Payer: Self-pay

## 2020-03-12 ENCOUNTER — Other Ambulatory Visit: Payer: Medicare Other

## 2020-03-12 DIAGNOSIS — R2681 Unsteadiness on feet: Secondary | ICD-10-CM

## 2020-03-12 DIAGNOSIS — R2689 Other abnormalities of gait and mobility: Secondary | ICD-10-CM

## 2020-03-12 DIAGNOSIS — R293 Abnormal posture: Secondary | ICD-10-CM

## 2020-03-12 DIAGNOSIS — M545 Low back pain, unspecified: Secondary | ICD-10-CM

## 2020-03-12 DIAGNOSIS — Z515 Encounter for palliative care: Secondary | ICD-10-CM

## 2020-03-12 DIAGNOSIS — M25552 Pain in left hip: Secondary | ICD-10-CM

## 2020-03-12 NOTE — Therapy (Signed)
[facility] [place] [address]  [address] [place], [place], [address] Phone: [phone]   Fax:  [phone]  Physical Therapy Treatment  Patient Details  Name: Darren Mueller MRN: [id number] Date of Birth: 11/07/1950 Referring Provider (PT): Stacey A Blyth, MD   Encounter Date: 03/12/2020  PT End of Session - 03/12/20 1114    Visit Number  5    Number of Visits  16    Date for PT Re-Evaluation  04/16/20    Authorization Type  Medicare & Tricare for Life    PT Start Time  1114   Pt arrived late   PT Stop Time  1152    PT Time Calculation (min)  38 min    Activity Tolerance  Patient tolerated treatment well    Behavior During Therapy  WFL for tasks assessed/performed       Past Medical History:  Diagnosis Date  . Alcohol dependence in remission (HCC) 03/22/2011   In remission   . Alcoholic cirrhosis of liver with ascites (HCC) 2014  . Anginal pain (HCC)    with SOB admitted 04/20/2019  . Cardiomegaly 02/2019   Noted on CXR  . CHF (congestive heart failure) (HCC)   . CKD (chronic kidney disease), stage III 07/18/2013  . COPD (chronic obstructive pulmonary disease) (HCC)   . Coronary artery disease   . GERD (gastroesophageal reflux disease)   . Gout   . Hearing loss   . Hypertension   . Hypertriglyceridemia 03/17/2017  . PAH (pulmonary artery hypertension) (HCC)    RV failure  . Pancytopenia (HCC)   . Pleural effusion 2014   Noted on CT:   Moderately large simple layering right pleural effusion   . Pulmonary nodule 2014   Noted on CT: An 8 mm pulmonary nodule in the periphery of the left lower lobe  . Rectal bleeding   . Seizures (HCC)   . Sleep apnea    does not wear CPAP  . Splenomegaly 12/2015   Noted on CT  . Thoracic aorta atherosclerosis (HCC) 12/2017  . Thrombocytopenia (HCC) 06/29/2016  . Unspecified hypothyroidism 01/25/2013  . Vertigo     Past Surgical History:  Procedure Laterality Date  . CARDIAC  CATHETERIZATION N/A 12/21/2015   Procedure: Right Heart Cath;  Surgeon: Daniel R Bensimhon, MD;  [facility];  Service: Cardiovascular;  Laterality: N/A;  . CARDIAC CATHETERIZATION N/A 04/15/2016   Procedure: Right/Left Heart Cath and Coronary Angiography;  Surgeon: Daniel R Bensimhon, MD;  [facility];  Service: Cardiovascular;  Laterality: N/A;  . COLONOSCOPY N/A 12/08/2013   Procedure: COLONOSCOPY;  Surgeon: Daniel P Jacobs, MD;  [facility];  Service: Endoscopy;  Laterality: N/A;  . COLONOSCOPY WITH PROPOFOL N/A 03/24/2019   Procedure: COLONOSCOPY WITH PROPOFOL;  Surgeon: Jacobs, Daniel P, MD;  [facility];  Service: Endoscopy;  Laterality: N/A;  Draw CBC in preop  . COLONOSCOPY WITH PROPOFOL N/A 04/28/2019   Procedure: COLONOSCOPY WITH PROPOFOL;  Surgeon: Jacobs, Daniel P, MD;  [facility];  Service: Endoscopy;  Laterality: N/A;  . ESOPHAGOGASTRODUODENOSCOPY (EGD) WITH PROPOFOL N/A 03/27/2016   Procedure: ESOPHAGOGASTRODUODENOSCOPY (EGD) WITH PROPOFOL;  Surgeon: Daniel P Jacobs, MD;  [facility];  Service: Endoscopy;  Laterality: N/A;  . ESOPHAGOGASTRODUODENOSCOPY (EGD) WITH PROPOFOL N/A 02/14/2019   Procedure: ESOPHAGOGASTRODUODENOSCOPY (EGD) WITH PROPOFOL;  Surgeon: Pyrtle, Jay M, MD;  [facility];  Service: Gastroenterology;  Laterality: N/A;  . EYE SURGERY   [facility] [place] [address]  [address] [place], [place], [address] Phone: [phone]   Fax:  [phone]  Physical Therapy Treatment  Patient Details  Name: Darren Mueller MRN: [id number] Date of Birth: 11/07/1950 Referring Provider (PT): Stacey A Blyth, MD   Encounter Date: 03/12/2020  PT End of Session - 03/12/20 1114    Visit Number  5    Number of Visits  16    Date for PT Re-Evaluation  04/16/20    Authorization Type  Medicare & Tricare for Life    PT Start Time  1114   Pt arrived late   PT Stop Time  1152    PT Time Calculation (min)  38 min    Activity Tolerance  Patient tolerated treatment well    Behavior During Therapy  WFL for tasks assessed/performed       Past Medical History:  Diagnosis Date  . Alcohol dependence in remission (HCC) 03/22/2011   In remission   . Alcoholic cirrhosis of liver with ascites (HCC) 2014  . Anginal pain (HCC)    with SOB admitted 04/20/2019  . Cardiomegaly 02/2019   Noted on CXR  . CHF (congestive heart failure) (HCC)   . CKD (chronic kidney disease), stage III 07/18/2013  . COPD (chronic obstructive pulmonary disease) (HCC)   . Coronary artery disease   . GERD (gastroesophageal reflux disease)   . Gout   . Hearing loss   . Hypertension   . Hypertriglyceridemia 03/17/2017  . PAH (pulmonary artery hypertension) (HCC)    RV failure  . Pancytopenia (HCC)   . Pleural effusion 2014   Noted on CT:   Moderately large simple layering right pleural effusion   . Pulmonary nodule 2014   Noted on CT: An 8 mm pulmonary nodule in the periphery of the left lower lobe  . Rectal bleeding   . Seizures (HCC)   . Sleep apnea    does not wear CPAP  . Splenomegaly 12/2015   Noted on CT  . Thoracic aorta atherosclerosis (HCC) 12/2017  . Thrombocytopenia (HCC) 06/29/2016  . Unspecified hypothyroidism 01/25/2013  . Vertigo     Past Surgical History:  Procedure Laterality Date  . CARDIAC  CATHETERIZATION N/A 12/21/2015   Procedure: Right Heart Cath;  Surgeon: Daniel R Bensimhon, MD;  [facility];  Service: Cardiovascular;  Laterality: N/A;  . CARDIAC CATHETERIZATION N/A 04/15/2016   Procedure: Right/Left Heart Cath and Coronary Angiography;  Surgeon: Daniel R Bensimhon, MD;  [facility];  Service: Cardiovascular;  Laterality: N/A;  . COLONOSCOPY N/A 12/08/2013   Procedure: COLONOSCOPY;  Surgeon: Daniel P Jacobs, MD;  [facility];  Service: Endoscopy;  Laterality: N/A;  . COLONOSCOPY WITH PROPOFOL N/A 03/24/2019   Procedure: COLONOSCOPY WITH PROPOFOL;  Surgeon: Jacobs, Daniel P, MD;  [facility];  Service: Endoscopy;  Laterality: N/A;  Draw CBC in preop  . COLONOSCOPY WITH PROPOFOL N/A 04/28/2019   Procedure: COLONOSCOPY WITH PROPOFOL;  Surgeon: Jacobs, Daniel P, MD;  [facility];  Service: Endoscopy;  Laterality: N/A;  . ESOPHAGOGASTRODUODENOSCOPY (EGD) WITH PROPOFOL N/A 03/27/2016   Procedure: ESOPHAGOGASTRODUODENOSCOPY (EGD) WITH PROPOFOL;  Surgeon: Daniel P Jacobs, MD;  [facility];  Service: Endoscopy;  Laterality: N/A;  . ESOPHAGOGASTRODUODENOSCOPY (EGD) WITH PROPOFOL N/A 02/14/2019   Procedure: ESOPHAGOGASTRODUODENOSCOPY (EGD) WITH PROPOFOL;  Surgeon: Pyrtle, Jay M, MD;  [facility];  Service: Gastroenterology;  Laterality: N/A;  . EYE SURGERY   [facility] [place] [address]  [address] [place], [place], [address] Phone: [phone]   Fax:  [phone]  Physical Therapy Treatment  Patient Details  Name: Darren Mueller MRN: [id number] Date of Birth: 11/07/1950 Referring Provider (PT): Stacey A Blyth, MD   Encounter Date: 03/12/2020  PT End of Session - 03/12/20 1114    Visit Number  5    Number of Visits  16    Date for PT Re-Evaluation  04/16/20    Authorization Type  Medicare & Tricare for Life    PT Start Time  1114   Pt arrived late   PT Stop Time  1152    PT Time Calculation (min)  38 min    Activity Tolerance  Patient tolerated treatment well    Behavior During Therapy  WFL for tasks assessed/performed       Past Medical History:  Diagnosis Date  . Alcohol dependence in remission (HCC) 03/22/2011   In remission   . Alcoholic cirrhosis of liver with ascites (HCC) 2014  . Anginal pain (HCC)    with SOB admitted 04/20/2019  . Cardiomegaly 02/2019   Noted on CXR  . CHF (congestive heart failure) (HCC)   . CKD (chronic kidney disease), stage III 07/18/2013  . COPD (chronic obstructive pulmonary disease) (HCC)   . Coronary artery disease   . GERD (gastroesophageal reflux disease)   . Gout   . Hearing loss   . Hypertension   . Hypertriglyceridemia 03/17/2017  . PAH (pulmonary artery hypertension) (HCC)    RV failure  . Pancytopenia (HCC)   . Pleural effusion 2014   Noted on CT:   Moderately large simple layering right pleural effusion   . Pulmonary nodule 2014   Noted on CT: An 8 mm pulmonary nodule in the periphery of the left lower lobe  . Rectal bleeding   . Seizures (HCC)   . Sleep apnea    does not wear CPAP  . Splenomegaly 12/2015   Noted on CT  . Thoracic aorta atherosclerosis (HCC) 12/2017  . Thrombocytopenia (HCC) 06/29/2016  . Unspecified hypothyroidism 01/25/2013  . Vertigo     Past Surgical History:  Procedure Laterality Date  . CARDIAC  CATHETERIZATION N/A 12/21/2015   Procedure: Right Heart Cath;  Surgeon: Daniel R Bensimhon, MD;  [facility];  Service: Cardiovascular;  Laterality: N/A;  . CARDIAC CATHETERIZATION N/A 04/15/2016   Procedure: Right/Left Heart Cath and Coronary Angiography;  Surgeon: Daniel R Bensimhon, MD;  [facility];  Service: Cardiovascular;  Laterality: N/A;  . COLONOSCOPY N/A 12/08/2013   Procedure: COLONOSCOPY;  Surgeon: Daniel P Jacobs, MD;  [facility];  Service: Endoscopy;  Laterality: N/A;  . COLONOSCOPY WITH PROPOFOL N/A 03/24/2019   Procedure: COLONOSCOPY WITH PROPOFOL;  Surgeon: Jacobs, Daniel P, MD;  [facility];  Service: Endoscopy;  Laterality: N/A;  Draw CBC in preop  . COLONOSCOPY WITH PROPOFOL N/A 04/28/2019   Procedure: COLONOSCOPY WITH PROPOFOL;  Surgeon: Jacobs, Daniel P, MD;  [facility];  Service: Endoscopy;  Laterality: N/A;  . ESOPHAGOGASTRODUODENOSCOPY (EGD) WITH PROPOFOL N/A 03/27/2016   Procedure: ESOPHAGOGASTRODUODENOSCOPY (EGD) WITH PROPOFOL;  Surgeon: Daniel P Jacobs, MD;  [facility];  Service: Endoscopy;  Laterality: N/A;  . ESOPHAGOGASTRODUODENOSCOPY (EGD) WITH PROPOFOL N/A 02/14/2019   Procedure: ESOPHAGOGASTRODUODENOSCOPY (EGD) WITH PROPOFOL;  Surgeon: Pyrtle, Jay M, MD;  [facility];  Service: Gastroenterology;  Laterality: N/A;  . EYE SURGERY   [facility] [place] [address]  [address] [place], [place], [address] Phone: [phone]   Fax:  [phone]  Physical Therapy Treatment  Patient Details  Name: Darren Mueller MRN: [id number] Date of Birth: 11/07/1950 Referring Provider (PT): Stacey A Blyth, MD   Encounter Date: 03/12/2020  PT End of Session - 03/12/20 1114    Visit Number  5    Number of Visits  16    Date for PT Re-Evaluation  04/16/20    Authorization Type  Medicare & Tricare for Life    PT Start Time  1114   Pt arrived late   PT Stop Time  1152    PT Time Calculation (min)  38 min    Activity Tolerance  Patient tolerated treatment well    Behavior During Therapy  WFL for tasks assessed/performed       Past Medical History:  Diagnosis Date  . Alcohol dependence in remission (HCC) 03/22/2011   In remission   . Alcoholic cirrhosis of liver with ascites (HCC) 2014  . Anginal pain (HCC)    with SOB admitted 04/20/2019  . Cardiomegaly 02/2019   Noted on CXR  . CHF (congestive heart failure) (HCC)   . CKD (chronic kidney disease), stage III 07/18/2013  . COPD (chronic obstructive pulmonary disease) (HCC)   . Coronary artery disease   . GERD (gastroesophageal reflux disease)   . Gout   . Hearing loss   . Hypertension   . Hypertriglyceridemia 03/17/2017  . PAH (pulmonary artery hypertension) (HCC)    RV failure  . Pancytopenia (HCC)   . Pleural effusion 2014   Noted on CT:   Moderately large simple layering right pleural effusion   . Pulmonary nodule 2014   Noted on CT: An 8 mm pulmonary nodule in the periphery of the left lower lobe  . Rectal bleeding   . Seizures (HCC)   . Sleep apnea    does not wear CPAP  . Splenomegaly 12/2015   Noted on CT  . Thoracic aorta atherosclerosis (HCC) 12/2017  . Thrombocytopenia (HCC) 06/29/2016  . Unspecified hypothyroidism 01/25/2013  . Vertigo     Past Surgical History:  Procedure Laterality Date  . CARDIAC  CATHETERIZATION N/A 12/21/2015   Procedure: Right Heart Cath;  Surgeon: Daniel R Bensimhon, MD;  [facility];  Service: Cardiovascular;  Laterality: N/A;  . CARDIAC CATHETERIZATION N/A 04/15/2016   Procedure: Right/Left Heart Cath and Coronary Angiography;  Surgeon: Daniel R Bensimhon, MD;  [facility];  Service: Cardiovascular;  Laterality: N/A;  . COLONOSCOPY N/A 12/08/2013   Procedure: COLONOSCOPY;  Surgeon: Daniel P Jacobs, MD;  [facility];  Service: Endoscopy;  Laterality: N/A;  . COLONOSCOPY WITH PROPOFOL N/A 03/24/2019   Procedure: COLONOSCOPY WITH PROPOFOL;  Surgeon: Jacobs, Daniel P, MD;  [facility];  Service: Endoscopy;  Laterality: N/A;  Draw CBC in preop  . COLONOSCOPY WITH PROPOFOL N/A 04/28/2019   Procedure: COLONOSCOPY WITH PROPOFOL;  Surgeon: Jacobs, Daniel P, MD;  [facility];  Service: Endoscopy;  Laterality: N/A;  . ESOPHAGOGASTRODUODENOSCOPY (EGD) WITH PROPOFOL N/A 03/27/2016   Procedure: ESOPHAGOGASTRODUODENOSCOPY (EGD) WITH PROPOFOL;  Surgeon: Daniel P Jacobs, MD;  [facility];  Service: Endoscopy;  Laterality: N/A;  . ESOPHAGOGASTRODUODENOSCOPY (EGD) WITH PROPOFOL N/A 02/14/2019   Procedure: ESOPHAGOGASTRODUODENOSCOPY (EGD) WITH PROPOFOL;  Surgeon: Pyrtle, Jay M, MD;  [facility];  Service: Gastroenterology;  Laterality: N/A;  . EYE SURGERY   Dermatitis 06/10/2015  . OSA (obstructive sleep apnea) 02/13/2015  . Gout 07/30/2014  . Diarrhea 09/04/2013  . Stage 4 chronic kidney disease ([place]) 07/18/2013  . Right heart failure due to pulmonary hypertension ([place]) 06/06/2013  . Hypothyroidism 01/25/2013  . PAH (pulmonary arterial hypertension) with portal hypertension ([place]) 01/21/2013  . Allergic rhinitis 12/21/2012  . Secondary pulmonary hypertension  12/09/2012  . Hepatic cirrhosis ([place]) 11/26/2012  . GERD (gastroesophageal reflux disease) 09/05/2011  . Hypokalemia 08/29/2011  . Colon polyps 03/26/2011  . Insomnia 03/26/2011  . Hypertension 03/22/2011  . Alcohol dependence ([place]) 03/22/2011    Percival Spanish, PT, MPT 03/12/2020, 12:20 PM  [facility] [address]  [place], [place], [address] Phone: [phone]   Fax:  [phone]  Name: Darren Mueller MRN: [id number] Date of Birth: 1951/05/25

## 2020-03-12 NOTE — Progress Notes (Signed)
PATIENT NAME: Darren Mueller DOB: January 08, 1951 MRN: [id number]  PRIMARY CARE PROVIDER: Mosie Lukes, MD  RESPONSIBLE PARTY:  Acct ID - Guarantor Home Phone Work Phone Relationship Acct Type  [id number] [name], [name]  Self P/F     [address], [place], [place] [address]    PLAN OF CARE and INTERVENTIONS:               1.  GOALS OF CARE/ ADVANCE CARE PLANNING:  Pateint would like to get stronger and remain independent.                2.  PATIENT/CAREGIVER EDUCATION:  Education on palliative care services, Education on need to weigh self daily and at the same time each day, education on need to take meds as directed to manage symptoms, reviewed meds, support               3.  DISEASE STATUS: RN made scheduled palliative care visit. Palliative care nurse met with patient, patients daughter Darren Mueller and patients son Darren Mueller. Patients son Darren Mueller lives in home with patient. Patients past medical history includes hypertension, pulmonary arterial hypertension, right sided heart failure, CHF, COPD, sleep apnea, hepatic cirrhosis and history of alcohol abuse. Daughter reports patient also has kidney and liver failure. Patient sitting on sofa watching TV. Patient alert and oriented x 4.  Patient denies having any pain at the present time. Patient is independent with ambulation and able to do his own ADL's.  Patient has had 6 hospitalizations since January the most recent one being for increased ammonia level. Patient was started on lactulose 30 grams 3 times a day. Patient reports he has been taking Lactulose as directed. Patient reports his goal is to get stronger and improve. Daughter states cardiologist informed them patient's prognosis was likely six months or less. Daughter states they are not willing to stop patient's medications therefore they are not in agreement with hospice referral. Daughter states palliative care nurse did talk with him while patient was in the hospital. Patient  receiving Home Health services through [facility]. Patient receiving outpatient rehab twice a week on Monday and Wednesdays. Patient reports having shortness of breath on exertion walking over 20 feet. Patient denies having any cough. Patients appetite is good and patient reports his weight this morning was 183 lbs. Patient is 6'1 in height. Patient reports he has not been sleeping well and Trazodone was added for sleep.  Patient to pick up later today.  Daughter states they stopped administering Xanax to patient as they felt Xanax was too strong. Patient has no edema currently in his lower extremities. Patients breath sounds are clear. Patient has no visible open areas of skin breakdown. Patient has a living will and health care power-of-attorney. Patient does not want to be resuscitated and requests a MOST form.  Nurse will update social worker and request MOST form for patient. Nurse reviewed patient's medications with patient. Patient and family encouraged to contact palliative care with questions or concerns.     HISTORY OF PRESENT ILLNESS:  Patient is a 69 year old male who resides in his home.  Patients son Darren Mueller lives in the home with patient.  Patient will be followed by palliative care and will be seen monthly and PRN.   CODE STATUS: No Code  ADVANCED DIRECTIVES: Y MOST FORM: Will request SW to obtain PPS: 60%   PHYSICAL EXAM:   VITALS: Today's Vitals   03/12/20 1519  BP: 98/70  Pulse: 82  Resp: 16  Temp: 98.5 F (36.9 C)  TempSrc: Temporal  SpO2: 98%  Weight: 184 lb (83.5 kg)  Height: 6' 1" (1.854 m)  PainSc: 0-No pain    LUNGS: clear to auscultation  CARDIAC: Cor RRR  EXTREMITIES: Trace edema SKIN: Skin color, texture, turgor normal. No rashes or lesions  NEURO: positive for weakness       Nilda Simmer, RN

## 2020-03-14 ENCOUNTER — Other Ambulatory Visit (INDEPENDENT_AMBULATORY_CARE_PROVIDER_SITE_OTHER): Payer: Medicare Other

## 2020-03-14 ENCOUNTER — Ambulatory Visit: Payer: Medicare Other

## 2020-03-14 ENCOUNTER — Encounter: Payer: Self-pay | Admitting: *Deleted

## 2020-03-14 ENCOUNTER — Other Ambulatory Visit: Payer: Self-pay

## 2020-03-14 ENCOUNTER — Other Ambulatory Visit: Payer: Self-pay | Admitting: *Deleted

## 2020-03-14 ENCOUNTER — Telehealth: Payer: Self-pay | Admitting: Family Medicine

## 2020-03-14 DIAGNOSIS — R2689 Other abnormalities of gait and mobility: Secondary | ICD-10-CM

## 2020-03-14 DIAGNOSIS — N289 Disorder of kidney and ureter, unspecified: Secondary | ICD-10-CM

## 2020-03-14 DIAGNOSIS — M545 Low back pain, unspecified: Secondary | ICD-10-CM

## 2020-03-14 DIAGNOSIS — D649 Anemia, unspecified: Secondary | ICD-10-CM | POA: Diagnosis not present

## 2020-03-14 DIAGNOSIS — R2681 Unsteadiness on feet: Secondary | ICD-10-CM | POA: Diagnosis not present

## 2020-03-14 DIAGNOSIS — R293 Abnormal posture: Secondary | ICD-10-CM

## 2020-03-14 DIAGNOSIS — R7989 Other specified abnormal findings of blood chemistry: Secondary | ICD-10-CM | POA: Diagnosis not present

## 2020-03-14 DIAGNOSIS — M25552 Pain in left hip: Secondary | ICD-10-CM

## 2020-03-14 LAB — COMPREHENSIVE METABOLIC PANEL
ALT: 25 U/L (ref 0–53)
AST: 42 U/L — ABNORMAL HIGH (ref 0–37)
Albumin: 3.2 g/dL — ABNORMAL LOW (ref 3.5–5.2)
Alkaline Phosphatase: 220 U/L — ABNORMAL HIGH (ref 39–117)
BUN: 41 mg/dL — ABNORMAL HIGH (ref 6–23)
CO2: 24 mEq/L (ref 19–32)
Calcium: 8.2 mg/dL — ABNORMAL LOW (ref 8.4–10.5)
Chloride: 106 mEq/L (ref 96–112)
Creatinine, Ser: 2.26 mg/dL — ABNORMAL HIGH (ref 0.40–1.50)
GFR: 34.87 mL/min — ABNORMAL LOW (ref >60.00)
Glucose, Bld: 114 mg/dL — ABNORMAL HIGH (ref 70–99)
Potassium: 2.9 mEq/L — ABNORMAL LOW (ref 3.5–5.1)
Sodium: 138 mEq/L (ref 135–145)
Total Bilirubin: 1.5 mg/dL — ABNORMAL HIGH (ref 0.2–1.2)
Total Protein: 6.3 g/dL (ref 6.0–8.3)

## 2020-03-14 LAB — CBC
HCT: 30.6 % — ABNORMAL LOW (ref 39.0–52.0)
Hemoglobin: 10.1 g/dL — ABNORMAL LOW (ref 13.0–17.0)
MCHC: 33 g/dL (ref 30.0–36.0)
MCV: 103.1 fl — ABNORMAL HIGH (ref 78.0–100.0)
Platelets: 30 10*3/uL — CL (ref 150.0–400.0)
RBC: 2.96 Mil/uL — ABNORMAL LOW (ref 4.22–5.81)
RDW: 18.7 % — ABNORMAL HIGH (ref 11.5–15.5)
WBC: 2.2 10*3/uL — ABNORMAL LOW (ref 4.0–10.5)

## 2020-03-14 LAB — AMMONIA: Ammonia: 51 umol/L — ABNORMAL HIGH (ref 11–35)

## 2020-03-14 NOTE — Patient Outreach (Signed)
[facility]) Care Management  03/14/2020  Darren Mueller 07/25/1951 [id number]  Telephone Assessment  RN spoke with pt's daughter Darren Mueller) and verified pt has been adherent with taking his Lactulose and all other mediations. Pt will also begin serviced with [facility] for Palliative services soon and pt no longer receiving HHPT. Pt has opt to continue with outpt PT services and continue to go to all scheduled appointments.   RN review the current plan of care as pt again attends all medical appointments and taking all prescribed medications with no reported issues. RN will continue to reiterate on HF action plan and related symptoms to reports if acute. Pt continue to recovery well and manage his current diagnosis with no reported issues. Will continue to provider the necessary tools and educate for pt to meet his goals. Pt is in the GREEN zone today with no acute symptoms. Plan of care will be updated according with interventions adjusted accordingly.  [facility] CM Care Plan Problem One     Most Recent Value  Care Plan Problem One  Deficient Knowledge related to CHF unfamiliarity with information/education  Role Documenting the Problem One  Care Management Telephonic Coordinator  Care Plan for Problem One  Active  THN Long Term Goal   Pt/Caregiver will verbalize a plan of action in the YELLOW zone within the next 90 days.  THN Long Term Goal Start Date  02/09/20  Interventions for Problem One Long Term Goal  Will continue to educate accordingly and review the CHF action plan and who to call if aute symptoms should occur. Will continue review and re-evaluate this goal accordingly.. Will continue to encouraged adherence with managing this condition.  THN CM Short Term Goal #1   Adherence with all discharge medications within the next 30 days.  THN CM Short Term Goal #1 Start Date  02/09/20  THN CM Short Term Goal #1 Met Date  03/14/20  THN CM Short Term Goal #2   Adherence with  all medical appointments post op discharge within the next 30 days.  THN CM Short Term Goal #2 Start Date  02/09/20  [facility] CM Short Term Goal #2 Met Date  03/14/20      Darren Mina, RN Care Management Coordinator [place] Office [phone]

## 2020-03-14 NOTE — Therapy (Signed)
[facility] [address]  [place], [place], [address] Phone: [phone]   Fax:  [phone]  Physical Therapy Treatment  Patient Details  Name: Darren Mueller MRN: [id number] Date of Birth: 1951-05-28 Referring Provider (PT): Mosie Lukes, MD   Encounter Date: 03/14/2020  PT End of Session - 03/14/20 1116    Visit Number  6    Number of Visits  16    Date for PT Re-Evaluation  04/16/20    Authorization Type  Medicare & Tricare for Life    PT Start Time  1104    PT Stop Time  1145    PT Time Calculation (min)  41 min    Activity Tolerance  Patient tolerated treatment well    Behavior During Therapy  [facility] for tasks assessed/performed       Past Medical History:  Diagnosis Date  . Alcohol dependence in remission ([place]) 03/22/2011   In remission   . Alcoholic cirrhosis of liver with ascites ([place]) 2014  . Anginal pain ([place])    with SOB admitted 04/20/2019  . Cardiomegaly 02/2019   Noted on CXR  . CHF (congestive heart failure) ([place])   . CKD (chronic kidney disease), stage III 07/18/2013  . COPD (chronic obstructive pulmonary disease) ([place])   . Coronary artery disease   . GERD (gastroesophageal reflux disease)   . Gout   . Hearing loss   . Hypertension   . Hypertriglyceridemia 03/17/2017  . PAH (pulmonary artery hypertension) ([facility])    RV failure  . Pancytopenia ([place])   . Pleural effusion 2014   Noted on CT:   Moderately large simple layering right pleural effusion   . Pulmonary nodule 2014   Noted on CT: An 8 mm pulmonary nodule in the periphery of the left lower lobe  . Rectal bleeding   . Seizures ([place])   . Sleep apnea    does not wear CPAP  . Splenomegaly 12/2015   Noted on CT  . Thoracic aorta atherosclerosis ([place]) 12/2017  . Thrombocytopenia ([place]) 06/29/2016  . Unspecified hypothyroidism 01/25/2013  . Vertigo     Past Surgical History:  Procedure Laterality Date  . CARDIAC CATHETERIZATION N/A  12/21/2015   Procedure: Right Heart Cath;  Surgeon: Jolaine Artist, MD;  Location: [facility];  Service: Cardiovascular;  Laterality: N/A;  . CARDIAC CATHETERIZATION N/A 04/15/2016   Procedure: Right/Left Heart Cath and Coronary Angiography;  Surgeon: Jolaine Artist, MD;  Location: [facility];  Service: Cardiovascular;  Laterality: N/A;  . COLONOSCOPY N/A 12/08/2013   Procedure: COLONOSCOPY;  Surgeon: Milus Banister, MD;  Location: [facility]OS[facility];  Service: Endoscopy;  Laterality: N/A;  . COLONOSCOPY WITH PROPOFOL N/A 03/24/2019   Procedure: COLONOSCOPY WITH PROPOFOL;  Surgeon: Milus Banister, MD;  Location: [facility];  Service: Endoscopy;  Laterality: N/A;  Draw CBC in preop  . COLONOSCOPY WITH PROPOFOL N/A 04/28/2019   Procedure: COLONOSCOPY WITH PROPOFOL;  Surgeon: Milus Banister, MD;  Location: [facility];  Service: Endoscopy;  Laterality: N/A;  . ESOPHAGOGASTRODUODENOSCOPY (EGD) WITH PROPOFOL N/A 03/27/2016   Procedure: ESOPHAGOGASTRODUODENOSCOPY (EGD) WITH PROPOFOL;  Surgeon: Milus Banister, MD;  Location: [facility];  Service: Endoscopy;  Laterality: N/A;  . ESOPHAGOGASTRODUODENOSCOPY (EGD) WITH PROPOFOL N/A 02/14/2019   Procedure: ESOPHAGOGASTRODUODENOSCOPY (EGD) WITH PROPOFOL;  Surgeon: Jerene Bears, MD;  Location: [facility] ENDOSCOPY;  Service: Gastroenterology;  Laterality: N/A;  . EYE SURGERY Bilateral 03/15/2019  lasix  . GAS/FLUID EXCHANGE Right 09/22/2019   Procedure: GAS/FLUID EXCHANGE (C3F8) RIGHT EYE;  Surgeon: Sherlynn Stalls, MD;  Location: [facility];  Service: Ophthalmology;  Laterality: Right;  . HEMOSTASIS CLIP PLACEMENT  04/28/2019   Procedure: HEMOSTASIS CLIP PLACEMENT;  Surgeon: Milus Banister, MD;  Location: [facility];  Service: Endoscopy;;  . LOOP RECORDER INSERTION N/A 01/01/2017   Procedure: Loop Recorder Insertion;  Surgeon: Thompson Grayer, MD;  Location: [facility];  Service: Cardiovascular;  Laterality: N/A;  . PARS PLANA VITRECTOMY  Right 09/22/2019   Procedure: PARS PLANA VITRECTOMY WITH 25 GAUGE RIGHT EYE;  Surgeon: Sherlynn Stalls, MD;  Location: [facility];  Service: Ophthalmology;  Laterality: Right;  . PHOTOCOAGULATION WITH LASER Right 09/22/2019   Procedure: PHOTOCOAGULATION WITH LASER RIGHT EYE;  Surgeon: Sherlynn Stalls, MD;  Location: [facility];  Service: Ophthalmology;  Laterality: Right;  . POLYPECTOMY  04/28/2019   Procedure: POLYPECTOMY;  Surgeon: Milus Banister, MD;  Location: [facility];  Service: Endoscopy;;  . RIGHT HEART CATHETERIZATION Right 06/06/2014   Procedure: RIGHT HEART CATH;  Surgeon: Jolaine Artist, MD;  Location: [facility] C[facility] LAB;  Service: Cardiovascular;  Laterality: Right;  . UVULOPALATOPHARYNGOPLASTY  1999    There were no vitals filed for this visit.  Subjective Assessment - 03/14/20 1112    Subjective  Pt. reporting she waited for 3 hours at the [facility] for routine f/u.    Pertinent History  PMH significant for CHF, CAD, COPD, CKD, HTN, aortopulmonary hypertension, heart murmur, HLD, cellulitis, vertigo, seizures, gout, ETOH abuse with ETOH cirrhosis and small esophageal varices    Currently in Pain?  No/denies    Pain Score  0-No pain    Multiple Pain Sites  No                        [facility] Adult PT Treatment/Exercise - 03/14/20 0001      Lumbar Exercises: Stretches   Passive Hamstring Stretch  Right;Left;2 reps;30 seconds    Passive Hamstring Stretch Limitations  Manual with therapit     Lower Trunk Rotation Limitations  5" x 10 reps       Lumbar Exercises: Aerobic   Nustep  L3 x 7 min (UE/LE)      Lumbar Exercises: Standing   Scapular Retraction  Both;15 reps    Scapular Retraction Limitations  retraction+ chin tuck at wall     Row  Both;10 reps;Theraband    Theraband Level (Row)  Level 2 (Red)    Row Limitations  Alternating staggered stance with therapist anchoring band       Knee/Hip Exercises: Standing   Heel Raises  Both;10 reps    Heel Raises Limitations  heel  and toe raise     Hip Flexion  Right;Left;10 reps;Knee bent    Hip Flexion Limitations  at TM rail     Hip Abduction  Right;Left;10 reps;Knee straight;Stengthening    Abduction Limitations  at TM rail              PT Education - 03/14/20 1143    Education Details  HEP update;  standing retraction and chin tuck at wall (upright posture)    Person(s) Educated  Patient    Methods  Explanation;Demonstration;Verbal cues;Handout    Comprehension  Verbalized understanding;Returned demonstration;Verbal cues required       PT Short Term Goals - 03/12/20 1119      PT SHORT TERM GOAL #1   Title  Patient  will be independent with initial HEP    Status  Achieved   03/12/20     PT SHORT TERM GOAL #2   Title  Patient to report L hip/low back pain reduction in frequency and intensity by >/= 25-50%    Status  Achieved   03/12/20     PT SHORT TERM GOAL #3   Title  Patient will increase gait speed to >/= 2.0 ft/sec with improved posture and gait pattern to increase safety with community ambulation    Status  On-going    Target Date  03/19/20      PT SHORT TERM GOAL #4   Title  Patient will demonstrate decreased TUG time to </= 20 sec to decrease risk for falls with transitional mobility    Status  On-going    Target Date  03/19/20        PT Long Term Goals - 03/12/20 1152      PT LONG TERM GOAL #1   Title  Patient will be independent with ongoing/advanced HEP    Status  On-going    Target Date  04/16/20      PT LONG TERM GOAL #2   Title  Patient to report L hip/low back pain reduction in frequency and intensity by >/= 50-75%    Status  Achieved   03/12/20     PT LONG TERM GOAL #3   Title  Patient will demonstrate improved B LE strength to >/= 4 to 4+/5 for improved stability and ease of mobility    Status  On-going    Target Date  04/16/20      PT LONG TERM GOAL #4   Title  Patient will increase gait speed to >/= 2.62 ft/sec with good posture and normal gait pattern to  increase safety with community ambulation    Status  On-going    Target Date  04/16/20      PT LONG TERM GOAL #5   Title  Patient will demonstrate decreased TUG time to </= 13.5 sec to decrease risk for falls with transitional mobility    Status  On-going    Target Date  04/16/20      PT LONG TERM GOAL #6   Title  Patient will improve Berg score to >/= 47/56 to improve safety stability with ADLs in standing and reduce risk for falls    Status  On-going    Target Date  04/16/20            Plan - 03/14/20 1303    Clinical Impression Statement  Pt. admitting to continued poor compliance with HEP which he attributes to lacking time with busy doctors' visits.  Pt. encouraged to perform HEP for full benefit from therapy.  Pt. does report "feeling much better" attending therapy and denies recent LBP/hip pain and remained pain free in session.  Progressed postural strengthening and proximal hip strengthening/lumbar ROM without issue today.  Ended visit with pt. pain free and small HEP updated issued to pt. via handout (see pt. education section).  Will address remaining STGs in coming session.    Comorbidities  PMH significant for CHF, CAD, COPD, CKD, HTN, aortopulmonary hypertension, heart murmur, HLD, cellulitis, vertigo, seizures, gout, ETOH abuse with ETOH cirrhosis and small esophageal varices    Rehab Potential  Good    PT Frequency  2x / week    PT Treatment/Interventions  ADLs/Self Care Home Management;Cryotherapy;Electrical Stimulation;Moist Heat;DME Instruction;Gait training;Stair training;Functional mobility training;Therapeutic activities;Therapeutic exercise;Balance training;Neuromuscular re-education;Patient/family education;Manual techniques;Passive range  of motion;Dry needling;Taping;Joint Manipulations    PT Next Visit Plan  Monitor VS; LE/core flexibility and postural stability/strengthening    PT Home Exercise Plan  Initial HEP (02/22/20) - Hooklying SKTC stretch, KTOS stretch,  LTR, sitting clam shell with red TB, sitting DF; 03/06/20 - scapular retraction; 03/14/20 - standing retraction + chin tucks    Consulted and Agree with Plan of Care  Patient       Patient will benefit from skilled therapeutic intervention in order to improve the following deficits and impairments:  Abnormal gait, Cardiopulmonary status limiting activity, Decreased activity tolerance, Decreased balance, Decreased coordination, Decreased endurance, Decreased knowledge of precautions, Decreased knowledge of use of DME, Decreased mobility, Decreased range of motion, Decreased safety awareness, Decreased strength, Difficulty walking, Increased edema, Increased muscle spasms, Impaired perceived functional ability, Impaired flexibility, Improper body mechanics, Postural dysfunction, Pain  Visit Diagnosis: Unsteadiness on feet  Other abnormalities of gait and mobility  Pain in left hip  Acute left-sided low back pain without sciatica  Abnormal posture     Problem List Patient Active Problem List   Diagnosis Date Noted  . Increased ammonia level 03/05/2020  . Anemia 03/05/2020  . Debility 03/05/2020  . Depression with anxiety 03/05/2020  . Palliative care by specialist   . Goals of care, counseling/discussion   . DNR (do not resuscitate)   . Hepatic encephalopathy ([place]) 02/25/2020  . Neck pain 02/10/2020  . Anorexia 02/10/2020  . AKI (acute kidney injury) ([place])   . Cellulitis of right lower extremity   . Acute exacerbation of CHF (congestive heart failure) ([place]) 01/30/2020  . CHF (congestive heart failure) ([place]) 01/20/2020  . Abdominal pain 08/23/2019  . Grief 08/23/2019  . Acute on chronic diastolic heart failure ([place]) 05/16/2019  . Chronic diastolic CHF (congestive heart failure) ([place]) 05/16/2019  . Calf pain 04/03/2019  . History of colonic polyps   . Polyp of cecum   . Headache 02/21/2019  . Rectal bleeding 02/13/2019  . COPD (chronic obstructive pulmonary disease) ([place])  02/13/2019  . Acute on chronic right heart failure ([place]) 02/13/2019  . Hematochezia   . Ascites   . Dark stools   . Alcoholism ([place])   . Hyperglycemia 05/04/2018  . Seizure disorder ([place]) 02/01/2018  . Pedal edema 10/01/2017  . RUQ pain 05/06/2017  . Decreased range of motion of left elbow 04/15/2017  . Hyperlipidemia 03/17/2017  . Elbow pain, left 03/17/2017  . Right rib fracture   . Syncope 07/19/2016  . Thrombocytopenia ([place]) 06/29/2016  . Hoarseness, chronic 06/29/2016  . Preventative health care 06/29/2016  . Chest pain   . Low back pain 12/09/2015  . Cirrhosis with alcoholism ([place]) 08/08/2015  . Pancytopenia ([place]) 06/10/2015  . Dermatitis 06/10/2015  . OSA (obstructive sleep apnea) 02/13/2015  . Gout 07/30/2014  . Diarrhea 09/04/2013  . Stage 4 chronic kidney disease ([place]) 07/18/2013  . Right heart failure due to pulmonary hypertension ([place]) 06/06/2013  . Hypothyroidism 01/25/2013  . PAH (pulmonary arterial hypertension) with portal hypertension ([place]) 01/21/2013  . Allergic rhinitis 12/21/2012  . Secondary pulmonary hypertension 12/09/2012  . Hepatic cirrhosis ([place]) 11/26/2012  . GERD (gastroesophageal reflux disease) 09/05/2011  . Hypokalemia 08/29/2011  . Colon polyps 03/26/2011  . Insomnia 03/26/2011  . Hypertension 03/22/2011  . Alcohol dependence ([place]) 03/22/2011    Bess Harvest, PTA 03/14/20 1:10 PM   [facility] [address]  [place], [place], [address] Phone: [phone]   Fax:  [phone]  Name: TRAVES MAJCHRZAK MRN: [id number] Date of Birth: [birth year]

## 2020-03-14 NOTE — Telephone Encounter (Signed)
Lab called with Critical platelet result of 30,000

## 2020-03-14 NOTE — Telephone Encounter (Signed)
Stable for patient

## 2020-03-14 NOTE — Telephone Encounter (Signed)
Daughter, Erick Blinks paperwork filled out and she was notified.  Paperwork sent to her email [email or web address].

## 2020-03-19 ENCOUNTER — Other Ambulatory Visit (HOSPITAL_COMMUNITY): Payer: Self-pay

## 2020-03-19 ENCOUNTER — Other Ambulatory Visit: Payer: Self-pay

## 2020-03-19 ENCOUNTER — Encounter: Payer: Self-pay | Admitting: Physical Therapy

## 2020-03-19 ENCOUNTER — Ambulatory Visit: Payer: Medicare Other | Admitting: Physical Therapy

## 2020-03-19 ENCOUNTER — Other Ambulatory Visit: Payer: Self-pay | Admitting: Medical

## 2020-03-19 DIAGNOSIS — M545 Low back pain, unspecified: Secondary | ICD-10-CM

## 2020-03-19 DIAGNOSIS — R2681 Unsteadiness on feet: Secondary | ICD-10-CM | POA: Diagnosis not present

## 2020-03-19 DIAGNOSIS — M25552 Pain in left hip: Secondary | ICD-10-CM

## 2020-03-19 DIAGNOSIS — R2689 Other abnormalities of gait and mobility: Secondary | ICD-10-CM

## 2020-03-19 DIAGNOSIS — I272 Pulmonary hypertension, unspecified: Secondary | ICD-10-CM

## 2020-03-19 DIAGNOSIS — R293 Abnormal posture: Secondary | ICD-10-CM

## 2020-03-19 MED ORDER — OPSUMIT 10 MG PO TABS: 10.0000 mg | ORAL_TABLET | Freq: Every day | ORAL | 3 refills | Status: AC

## 2020-03-19 MED FILL — OMEPRAZOLE 40 MG CPDR: 40 | 90 days supply | Qty: 90 | Fill #0

## 2020-03-19 NOTE — Therapy (Signed)
[name] [address]  [name], [place], [address] Phone: [phone]   Fax:  [phone]  Physical Therapy Treatment  Patient Details  Name: Darren Mueller MRN: [id number] Date of Birth: 1951-08-25 Referring Provider (PT): Mosie Lukes, MD  Progress Note  Reporting Period 02/20/2020 to 03/19/2020  See note below for Objective Data and Assessment of Progress/Goals.     Encounter Date: 03/19/2020  PT End of Session - 03/19/20 1110    Visit Number  7    Number of Visits  16    Date for PT Re-Evaluation  04/16/20    Authorization Type  Medicare & [employer] for [employer]    Progress Note Due on Visit  55   MD progress note completed on 7th visit - 03/19/20   PT Start Time  1110   Pt arrived late   PT Stop Time  1200    PT Time Calculation (min)  50 min    Activity Tolerance  Patient tolerated treatment well    Behavior During Therapy  [facility] for tasks assessed/performed       Past Medical History:  Diagnosis Date  . Alcohol dependence in remission ([place]) 03/22/2011   In remission   . Alcoholic cirrhosis of liver with ascites ([place]) 2014  . Anginal pain ([place])    with SOB admitted 04/20/2019  . Cardiomegaly 02/2019   Noted on CXR  . CHF (congestive heart failure) ([place])   . CKD (chronic kidney disease), stage III 07/18/2013  . COPD (chronic obstructive pulmonary disease) ([place])   . Coronary artery disease   . GERD (gastroesophageal reflux disease)   . Gout   . Hearing loss   . Hypertension   . Hypertriglyceridemia 03/17/2017  . PAH (pulmonary artery hypertension) ([facility])    RV failure  . Pancytopenia ([place])   . Pleural effusion 2014   Noted on CT:   Moderately large simple layering right pleural effusion   . Pulmonary nodule 2014   Noted on CT: An 8 mm pulmonary nodule in the periphery of the left lower lobe  . Rectal bleeding   . Seizures ([place])   . Sleep apnea    does not wear CPAP  . Splenomegaly 12/2015    Noted on CT  . Thoracic aorta atherosclerosis ([place]) 12/2017  . Thrombocytopenia ([place]) 06/29/2016  . Unspecified hypothyroidism 01/25/2013  . Vertigo     Past Surgical History:  Procedure Laterality Date  . CARDIAC CATHETERIZATION N/A 12/21/2015   Procedure: Right Heart Cath;  Surgeon: Jolaine Artist, MD;  Location: [place] [facility];  Service: Cardiovascular;  Laterality: N/A;  . CARDIAC CATHETERIZATION N/A 04/15/2016   Procedure: Right/Left Heart Cath and Coronary Angiography;  Surgeon: Jolaine Artist, MD;  Location: [place] [facility];  Service: Cardiovascular;  Laterality: N/A;  . COLONOSCOPY N/A 12/08/2013   Procedure: COLONOSCOPY;  Surgeon: Milus Banister, MD;  Location: [facility] ENDOSCOPY;  Service: Endoscopy;  Laterality: N/A;  . COLONOSCOPY WITH PROPOFOL N/A 03/24/2019   Procedure: COLONOSCOPY WITH PROPOFOL;  Surgeon: Milus Banister, MD;  Location: [facility] ENDOSCOPY;  Service: Endoscopy;  Laterality: N/A;  Draw CBC in preop  . COLONOSCOPY WITH PROPOFOL N/A 04/28/2019   Procedure: COLONOSCOPY WITH PROPOFOL;  Surgeon: Milus Banister, MD;  Location: [facility] ENDOSCOPY;  Service: Endoscopy;  Laterality: N/A;  . ESOPHAGOGASTRODUODENOSCOPY (EGD) WITH PROPOFOL N/A 03/27/2016   Procedure: ESOPHAGOGASTRODUODENOSCOPY (EGD) WITH PROPOFOL;  Surgeon: Milus Banister, MD;  Location: [facility]  ENDOSCOPY;  Service: Endoscopy;  Laterality: N/A;  . ESOPHAGOGASTRODUODENOSCOPY (EGD) WITH PROPOFOL N/A 02/14/2019   Procedure: ESOPHAGOGASTRODUODENOSCOPY (EGD) WITH PROPOFOL;  Surgeon: Jerene Bears, MD;  Location: [facility] ENDOSCOPY;  Service: Gastroenterology;  Laterality: N/A;  . EYE SURGERY Bilateral 03/15/2019   lasix  . GAS/FLUID EXCHANGE Right 09/22/2019   Procedure: GAS/FLUID EXCHANGE (C3F8) RIGHT EYE;  Surgeon: Sherlynn Stalls, MD;  Location: [facility];  Service: Ophthalmology;  Laterality: Right;  . HEMOSTASIS CLIP PLACEMENT  04/28/2019   Procedure: HEMOSTASIS CLIP PLACEMENT;  Surgeon: Milus Banister, MD;  Location: [facility]  ENDOSCOPY;  Service: Endoscopy;;  . LOOP RECORDER INSERTION N/A 01/01/2017   Procedure: Loop Recorder Insertion;  Surgeon: Thompson Grayer, MD;  Location: [place] [facility];  Service: Cardiovascular;  Laterality: N/A;  . PARS PLANA VITRECTOMY Right 09/22/2019   Procedure: PARS PLANA VITRECTOMY WITH 25 GAUGE RIGHT EYE;  Surgeon: Sherlynn Stalls, MD;  Location: [facility];  Service: Ophthalmology;  Laterality: Right;  . PHOTOCOAGULATION WITH LASER Right 09/22/2019   Procedure: PHOTOCOAGULATION WITH LASER RIGHT EYE;  Surgeon: Sherlynn Stalls, MD;  Location: [facility];  Service: Ophthalmology;  Laterality: Right;  . POLYPECTOMY  04/28/2019   Procedure: POLYPECTOMY;  Surgeon: Milus Banister, MD;  Location: [facility];  Service: Endoscopy;;  . RIGHT HEART CATHETERIZATION Right 06/06/2014   Procedure: RIGHT HEART CATH;  Surgeon: Jolaine Artist, MD;  Location: [facility] CATH LAB;  Service: Cardiovascular;  Laterality: Right;  . UVULOPALATOPHARYNGOPLASTY  1999    There were no vitals filed for this visit.  Subjective Assessment - 03/19/20 1114    Subjective  Pt not having a good day - reports new meds (lactulose) are making his abdomen feel "crampy".    Pertinent History  PMH significant for CHF, CAD, COPD, CKD, HTN, aortopulmonary hypertension, heart murmur, HLD, cellulitis, vertigo, seizures, gout, ETOH abuse with ETOH cirrhosis and small esophageal varices    Currently in Pain?  No/denies         [facility] PT Assessment - 03/19/20 1110      Assessment   Medical Diagnosis  Low back and L hip pain, Debility, Unsteady gait    Referring Provider (PT)  Mosie Lukes, MD    Next MD Visit  03/22/20      Strength   Right Hip Flexion  4/5    Right Hip Extension  4/5    Right Hip External Rotation   4+/5    Right Hip Internal Rotation  4+/5    Right Hip ABduction  4/5    Right Hip ADduction  4/5    Left Hip Flexion  4/5    Left Hip Extension  4/5    Left Hip External Rotation  4+/5    Left Hip Internal Rotation   4+/5    Left Hip ABduction  4/5    Left Hip ADduction  4/5    Right Knee Flexion  5/5    Right Knee Extension  5/5    Left Knee Flexion  5/5    Left Knee Extension  5/5    Right Ankle Dorsiflexion  4/5    Right Ankle Plantar Flexion  4-/5    Left Ankle Dorsiflexion  4/5    Left Ankle Plantar Flexion  4-/5      Ambulation/Gait   Gait Pattern  Step-through pattern;Trunk flexed   mild trunk flexion with head down   Gait velocity  3.27 ft/sec      Standardized Balance Assessment   10 Meter Walk  10.04 sec      Berg Balance Test   Sit to Stand  Able to stand without using hands and stabilize independently    Standing Unsupported  Able to stand safely 2 minutes    Sitting with Back Unsupported but Feet Supported on Floor or Stool  Able to sit safely and securely 2 minutes    Stand to Sit  Sits safely with minimal use of hands    Transfers  Able to transfer safely, minor use of hands    Standing Unsupported with Eyes Closed  Able to stand 10 seconds safely    Standing Unsupported with Feet Together  Able to place feet together independently and stand 1 minute safely    From Standing, Reach Forward with Outstretched Arm  Can reach confidently >25 cm (10")    From Standing Position, Pick up Object from Floor  Able to pick up shoe safely and easily    From Standing Position, Turn to Look Behind Over each Shoulder  Turn sideways only but maintains balance    Turn 360 Degrees  Able to turn 360 degrees safely but slowly    Standing Unsupported, Alternately Place Feet on Step/Stool  Able to stand independently and complete 8 steps >20 seconds    Standing Unsupported, One Foot in Front  Able to plae foot ahead of the other independently and hold 30 seconds    Standing on One Leg  Tries to lift leg/unable to hold 3 seconds but remains standing independently    Total Score  47    Berg comment:  46-51 moderate fall risk (>50%)       Timed Up and Go Test   Normal TUG (seconds)  14.06                     [facility] Adult PT Treatment/Exercise - 03/19/20 1110      Lumbar Exercises: Aerobic   Recumbent Bike  L1 x 6 min               PT Short Term Goals - 03/19/20 1117      PT SHORT TERM GOAL #1   Title  Patient will be independent with initial HEP    Status  Achieved   03/12/20     PT SHORT TERM GOAL #2   Title  Patient to report L hip/low back pain reduction in frequency and intensity by >/= 25-50%    Status  Achieved   03/12/20     PT SHORT TERM GOAL #3   Title  Patient will increase gait speed to >/= 2.0 ft/sec with improved posture and gait pattern to increase safety with community ambulation    Status  Achieved   03/19/20: gait speed = 3.27 ft/sec     PT SHORT TERM GOAL #4   Title  Patient will demonstrate decreased TUG time to </= 20 sec to decrease risk for falls with transitional mobility    Status  Achieved   03/19/20: TUG = 14.06 sec       PT Long Term Goals - 03/19/20 1127      PT LONG TERM GOAL #1   Title  Patient will be independent with ongoing/advanced HEP    Status  On-going   03/19/20: limited updates to HEP as pt has had limited opportunity to work on initial HEP due to MD appts and medical issues   Target Date  04/16/20      PT LONG TERM GOAL #2  Title  Patient to report L hip/low back pain reduction in frequency and intensity by >/= 50-75%    Status  Achieved   03/12/20     PT LONG TERM GOAL #3   Title  Patient will demonstrate improved B LE strength to >/= 4 to 4+/5 for improved stability and ease of mobility    Status  Partially Met    Target Date  04/16/20      PT LONG TERM GOAL #4   Title  Patient will increase gait speed to >/= 2.62 ft/sec with good posture and normal gait pattern to increase safety with community ambulation    Status  Partially Met   03/19/20: Met for gait speed (3.27 ft/sec) but still with tendency for head down posture   Target Date  04/16/20      PT LONG TERM GOAL #5   Title  Patient will  demonstrate decreased TUG time to </= 13.5 sec to decrease risk for falls with transitional mobility    Baseline  TUG = 27.62 sec    Status  On-going   03/19/20: TIG = 14.06 sec   Target Date  04/16/20      PT LONG TERM GOAL #6   Title  Patient will improve Berg score to >/= 51/56 to improve safety stability with ADLs in standing and reduce risk for falls    Baseline  Baseline: Berg = 38/56; Initial LTG: Patient will improve Berg score to >/= 47/56 to improve safety stability with ADLs in standing and reduce risk for falls (met 03/19/20 & revised)    Status  Revised   03/19/20: Merrilee Jansky = 47/56 - goal revised to reflect potential for further improvement   Target Date  04/16/20            Plan - 03/19/20 1200    Clinical Impression Statement  [name] has demonstrated excellent initial progress with PT. He has been pain-free in his hip and back for several weeks now - LTG #2 met. Overall LE strength has improved by at least  MMT grade with current strength 4-/5 to 5/5. Balance is improving as evidenced by improved Berg score from 38/56 to 47/56 (LTG #6 met and revised to reflect potential for further improvement) and TUG decreased from 27.62 sec to 14.06 sec. Gait is improving with increased gait speed from 1.5 ft/sec to 3.27 ft/sec with improved reciprocal pattern, longer stride length and improved foot clearance, although he still demonstrates tendency for head down posture while ambulating. Pt may benefit from dynamic gait assessment with FGA to better identify fall risk with ambulation. All STGs met, with good progress observed toward LTGs at this time. Anticipate [name] will continue to benefit from skilled PT for further strengthening, balance training and energy conservation training to increased activity tolerance and reduce fall risk for increased safety with community activity.    Personal Factors and Comorbidities  Comorbidity 3+;Past/Current Experience;Time since onset of  injury/illness/exacerbation;Age;Fitness    Comorbidities  PMH significant for CHF, CAD, COPD, CKD, HTN, aortopulmonary hypertension, heart murmur, HLD, cellulitis, vertigo, seizures, gout, ETOH abuse with ETOH cirrhosis and small esophageal varices    Examination-Activity Limitations  Bend;Lift;Locomotion Level;Sit;Squat;Stand    Examination-Participation Restrictions  Cleaning;Community Activity;Laundry;Meal Prep    Rehab Potential  Good    PT Frequency  2x / week    PT Treatment/Interventions  ADLs/Self Care Home Management;Cryotherapy;Electrical Stimulation;Moist Heat;DME Instruction;Gait training;Stair training;Functional mobility training;Therapeutic activities;Therapeutic exercise;Balance training;Neuromuscular re-education;Patient/family education;Manual techniques;Passive range of motion;Dry needling;Taping;Joint Manipulations  PT Next Visit Plan  Monitor VS; possible FGA assessment; LE/core flexibility and postural stability/strengthening; balance training/dynamic gait activities    PT Home Exercise Plan  02/22/20 - Hooklying SKTC stretch, KTOS stretch, LTR, sitting clam shell with red TB, sitting DF; 03/06/20 - scapular retraction; 03/14/20 - standing retraction + chin tucks    Consulted and Agree with Plan of Care  Patient       Patient will benefit from skilled therapeutic intervention in order to improve the following deficits and impairments:  Abnormal gait, Cardiopulmonary status limiting activity, Decreased activity tolerance, Decreased balance, Decreased coordination, Decreased endurance, Decreased knowledge of precautions, Decreased knowledge of use of DME, Decreased mobility, Decreased range of motion, Decreased safety awareness, Decreased strength, Difficulty walking, Increased edema, Increased muscle spasms, Impaired perceived functional ability, Impaired flexibility, Improper body mechanics, Postural dysfunction, Pain  Visit Diagnosis: Unsteadiness on feet  Other abnormalities  of gait and mobility  Pain in left hip  Acute left-sided low back pain without sciatica  Abnormal posture     Problem List Patient Active Problem List   Diagnosis Date Noted  . Increased ammonia level 03/05/2020  . Anemia 03/05/2020  . Debility 03/05/2020  . Depression with anxiety 03/05/2020  . Palliative care by specialist   . Goals of care, counseling/discussion   . DNR (do not resuscitate)   . Hepatic encephalopathy ([place]) 02/25/2020  . Neck pain 02/10/2020  . Anorexia 02/10/2020  . AKI (acute kidney injury) ([place])   . Cellulitis of right lower extremity   . Acute exacerbation of CHF (congestive heart failure) ([place]) 01/30/2020  . CHF (congestive heart failure) ([place]) 01/20/2020  . Abdominal pain 08/23/2019  . Grief 08/23/2019  . Acute on chronic diastolic heart failure ([place]) 05/16/2019  . Chronic diastolic CHF (congestive heart failure) ([place]) 05/16/2019  . Calf pain 04/03/2019  . History of colonic polyps   . Polyp of cecum   . Headache 02/21/2019  . Rectal bleeding 02/13/2019  . COPD (chronic obstructive pulmonary disease) ([place]) 02/13/2019  . Acute on chronic right heart failure ([place]) 02/13/2019  . Hematochezia   . Ascites   . Dark stools   . Alcoholism ([place])   . Hyperglycemia 05/04/2018  . Seizure disorder ([place]) 02/01/2018  . Pedal edema 10/01/2017  . RUQ pain 05/06/2017  . Decreased range of motion of left elbow 04/15/2017  . Hyperlipidemia 03/17/2017  . Elbow pain, left 03/17/2017  . Right rib fracture   . Syncope 07/19/2016  . Thrombocytopenia ([place]) 06/29/2016  . Hoarseness, chronic 06/29/2016  . Preventative health care 06/29/2016  . Chest pain   . Low back pain 12/09/2015  . Cirrhosis with alcoholism ([place]) 08/08/2015  . Pancytopenia ([place]) 06/10/2015  . Dermatitis 06/10/2015  . OSA (obstructive sleep apnea) 02/13/2015  . Gout 07/30/2014  . Diarrhea 09/04/2013  . Stage 4 chronic kidney disease ([place]) 07/18/2013  . Right heart failure due to  pulmonary hypertension ([place]) 06/06/2013  . Hypothyroidism 01/25/2013  . PAH (pulmonary arterial hypertension) with portal hypertension ([place]) 01/21/2013  . Allergic rhinitis 12/21/2012  . Secondary pulmonary hypertension 12/09/2012  . Hepatic cirrhosis ([place]) 11/26/2012  . GERD (gastroesophageal reflux disease) 09/05/2011  . Hypokalemia 08/29/2011  . Colon polyps 03/26/2011  . Insomnia 03/26/2011  . Hypertension 03/22/2011  . Alcohol dependence ([place]) 03/22/2011    Percival Spanish, PT, MPT 03/19/2020, 12:34 PM  [facility] [address]  [place], [place], [address] Phone: [phone]   Fax:  [phone]  Name: RENELL ALLUM MRN: [id number] Date of Birth: 28-Feb-1951

## 2020-03-20 ENCOUNTER — Telehealth (HOSPITAL_COMMUNITY): Payer: Self-pay

## 2020-03-20 NOTE — Telephone Encounter (Signed)
Received an email from [facility] as they have been trying to reach patient to ship medications uptravi and opsumit and they have had no success. I called patient and son without answer. LM on patients phone. Called patients daughter and gave her contact information for uptravi.

## 2020-03-21 ENCOUNTER — Ambulatory Visit: Payer: Medicare Other | Admitting: Physical Therapy

## 2020-03-21 NOTE — Progress Notes (Signed)
Subjective:   Darren Mueller is a 69 y.o. male who presents for Medicare Annual/Subsequent preventive examination.  Pt still involved in church.   Review of Systems:  Home Safety/Smoke Alarms: Feels safe in home. Smoke alarms in place.  Lives w/ son in 1 story home.  Male:   CCS- 04/28/19.     PSA-  Lab Results  Component Value Date   PSA 0.49 11/01/2012       Objective:    Vitals: BP 126/84 (BP Location: Left Arm, Patient Position: Sitting, Cuff Size: Normal)   Pulse 81   Temp (!) 97 F (36.1 C) (Temporal)   Ht _0  (1.854 m)   Wt 187 lb (84.8 kg)   SpO2 96%   BMI 24.67 kg/m   Body mass index is 24.67 kg/m.  Advanced Directives 03/22/2020 03/02/2020 02/25/2020 02/20/2020 02/10/2020 01/30/2020 01/20/2020  Does Patient Have a Medical Advance Directive? _1  Yes Yes  Type of Paramedic of [place];Living will [place];Living will [place];Living will [place];Living will Healthcare Power of [place];Living will Living will;Healthcare Power of Attorney  Does patient want to make changes to medical advance directive? No - Patient declined - No - Guardian declined No - Patient declined No - Patient declined - No - Patient declined  Copy of [place] in Chart? No - copy requested - No - copy requested Yes - validated most recent copy scanned in chart (See row information) Yes - validated most recent copy scanned in chart (See row information) - Yes - validated most recent copy scanned in chart (See row information)  Would patient like information on creating a medical advance directive? - - - - - - -  Pre-existing out of facility DNR order (yellow form or pink MOST form) - - - - - - -    Tobacco Social History   Tobacco Use  Smoking Status Former Smoker  . Packs/day: 2.00  . Years: 5.00  . Pack years: 10.00  . Types: Cigarettes  .  Quit date: 09/22/1979  . Years since quitting: 40.5  Smokeless Tobacco Never Used     Counseling given: Not Answered   Clinical Intake:     Pain : No/denies pain                 Past Medical History:  Diagnosis Date  . Alcohol dependence in remission ([place]) 03/22/2011   In remission   . Alcoholic cirrhosis of liver with ascites ([place]) 2014  . Anginal pain ([place])    with SOB admitted 04/20/2019  . Cardiomegaly 02/2019   Noted on CXR  . CHF (congestive heart failure) ([place])   . CKD (chronic kidney disease), stage III 07/18/2013  . COPD (chronic obstructive pulmonary disease) ([place])   . Coronary artery disease   . GERD (gastroesophageal reflux disease)   . Gout   . Hearing loss   . Hypertension   . Hypertriglyceridemia 03/17/2017  . PAH (pulmonary artery hypertension) (H[facility])    RV failure  . Pancytopenia ([place])   . Pleural effusion 2014   Noted on CT:   Moderately large simple layering right pleural effusion   . Pulmonary nodule 2014   Noted on CT: An 8 mm pulmonary nodule in the periphery of the left lower lobe  . Rectal bleeding   . Seizures ([place])   . Sleep apnea    does not  wear CPAP  . Splenomegaly 12/2015   Noted on CT  . Thoracic aorta atherosclerosis ([place]) 12/2017  . Thrombocytopenia ([place]) 06/29/2016  . Unspecified hypothyroidism 01/25/2013  . Vertigo    Past Surgical History:  Procedure Laterality Date  . CARDIAC CATHETERIZATION N/A 12/21/2015   Procedure: Right Heart Cath;  Surgeon: Jolaine Artist, MD;  Location: [facility];  Service: Cardiovascular;  Laterality: N/A;  . CARDIAC CATHETERIZATION N/A 04/15/2016   Procedure: Right/Left Heart Cath and Coronary Angiography;  Surgeon: Jolaine Artist, MD;  Location: [facility];  Service: Cardiovascular;  Laterality: N/A;  . COLONOSCOPY N/A 12/08/2013   Procedure: COLONOSCOPY;  Surgeon: Milus Banister, MD;  Location: [facility];  Service: Endoscopy;  Laterality: N/A;  . COLONOSCOPY WITH  PROPOFOL N/A 03/24/2019   Procedure: COLONOSCOPY WITH PROPOFOL;  Surgeon: Milus Banister, MD;  Location: [facility];  Service: Endoscopy;  Laterality: N/A;  Draw CBC in preop  . COLONOSCOPY WITH PROPOFOL N/A 04/28/2019   Procedure: COLONOSCOPY WITH PROPOFOL;  Surgeon: Milus Banister, MD;  [facility];  Service: Endoscopy;  Laterality: N/A;  . ESOPHAGOGASTRODUODENOSCOPY (EGD) WITH PROPOFOL N/A 03/27/2016   Procedure: ESOPHAGOGASTRODUODENOSCOPY (EGD) WITH PROPOFOL;  Surgeon: Milus Banister, MD;  Location: [facility];  Service: Endoscopy;  Laterality: N/A;  . ESOPHAGOGASTRODUODENOSCOPY (EGD) WITH PROPOFOL N/A 02/14/2019   Procedure: ESOPHAGOGASTRODUODENOSCOPY (EGD) WITH PROPOFOL;  Surgeon: Jerene Bears, MD;  Location: [facility];  Service: Gastroenterology;  Laterality: N/A;  . EYE SURGERY Bilateral 03/15/2019   lasix  . GAS/FLUID EXCHANGE Right 09/22/2019   Procedure: GAS/FLUID EXCHANGE (C3F8) RIGHT EYE;  Surgeon: Sherlynn Stalls, MD;  Location: [facility];  Service: Ophthalmology;  Laterality: Right;  . HEMOSTASIS CLIP PLACEMENT  04/28/2019   Procedure: HEMOSTASIS CLIP PLACEMENT;  Surgeon: Milus Banister, MD;  [facility]: [facility];  Service: Endoscopy;;  . LOOP RECORDER INSERTION N/A 01/01/2017   Procedure: Loop Recorder Insertion;  Surgeon: Thompson Grayer, MD;  Location: [facility];  Service: Cardiovascular;  Laterality: N/A;  . PARS PLANA VITRECTOMY Right 09/22/2019   Procedure: PARS PLANA VITRECTOMY WITH 25 GAUGE RIGHT EYE;  Surgeon: Sherlynn Stalls, MD;  Location: [facility];  Service: Ophthalmology;  Laterality: Right;  . PHOTOCOAGULATION WITH LASER Right 09/22/2019   Procedure: PHOTOCOAGULATION WITH LASER RIGHT EYE;  Surgeon: Sherlynn Stalls, MD;  Location: [facility];  Service: Ophthalmology;  Laterality: Right;  . POLYPECTOMY  04/28/2019   Procedure: POLYPECTOMY;  Surgeon: Milus Banister, MD;  Location: [facility];  Service: Endoscopy;;  . RIGHT HEART CATHETERIZATION Right  06/06/2014   Procedure: RIGHT HEART CATH;  Surgeon: Jolaine Artist, MD;  Location: [facility];  Service: Cardiovascular;  Laterality: Right;  . UVULOPALATOPHARYNGOPLASTY  1999   Family History  Problem Relation Age of Onset  . Heart disease Mother   . Heart attack Mother   . Hypertension Mother   . Kidney failure Mother   . Liver disease Mother        stage 4 liver disease  . Prostate cancer Father   . Cancer Father        lung  . Hypertension Brother   . Gout Brother   . Alcoholism Maternal Grandfather   . Liver disease Maternal Grandfather   . Migraines Sister   . Hypertension Brother   . Colon cancer Neg Hx    Social History   Socioeconomic History  . Marital status: Divorced    Spouse name: Not on file  .  Number of children: 3  . Years of education: Not on file  . Highest education level: Not on file  Occupational History  . Occupation: Retired    Comment: Social research officer, government  Tobacco Use  . Smoking status: Former Smoker    Packs/day: 2.00    Years: 5.00    Pack years: 10.00    Types: Cigarettes    Quit date: 09/22/1979    Years since quitting: 40.5  . Smokeless tobacco: Never Used  Substance and Sexual Activity  . Alcohol use: Yes    Alcohol/week: 6.0 standard drinks    Types: 6 Shots of liquor per week    Comment: quit drinking in 12/2019  . Drug use: No  . Sexual activity: Yes    Birth control/protection: Spermicide  Other Topics Concern  . Not on file  Social History Narrative   0 caffeine drinks daily    Social Determinants of Health   Financial Resource Strain:   . Difficulty of Paying Living Expenses:   Food Insecurity:   . Worried About Charity fundraiser in the Last Year:   . Arboriculturist in the Last Year:   Transportation Needs:   . Film/video editor (Medical):   Marland Kitchen Lack of Transportation (Non-Medical):   Physical Activity:   . Days of Exercise per Week:   . Minutes of Exercise per Session:   Stress:   . Feeling of Stress :   Social  Connections:   . Frequency of Communication with Friends and Family:   . Frequency of Social Gatherings with Friends and Family:   . Attends Religious Services:   . Active Member of Clubs or Organizations:   . Attends Archivist Meetings:   Marland Kitchen Marital Status:     Outpatient Encounter Medications as of 03/22/2020  Medication Sig  . acetaminophen (TYLENOL) 500 MG tablet Take 1 tablet (500 mg total) by mouth every 6 (six) hours as needed (for pain.).  Marland Kitchen Blood Pressure Monitoring (BLOOD PRESSURE CUFF) MISC Use as directed. Check blood pressure bid prn Dx:I10  . Ferrous Fumarate (HEMOCYTE) 324 (106 Fe) MG TABS tablet Take 1 tablet (106 mg of iron total) by mouth daily.  Marland Kitchen FLUoxetine (PROZAC) 20 MG tablet Take 0.5-1 tablets (10-20 mg total) by mouth daily.  . folic acid (FOLVITE) 1 MG tablet Take 1 mg by mouth daily.  Marland Kitchen lactulose (CHRONULAC) 10 GM/15ML solution Take 30 mLs (20 g total) by mouth 2 (two) times daily.  . macitentan (OPSUMIT) 10 MG tablet Take 1 tablet (10 mg total) by mouth daily.  Marland Kitchen omeprazole (PRILOSEC) 40 MG capsule TAKE 1 CAPSULE BY MOUTH ONCE DAILY  . Potassium Chloride ER 20 MEQ TBCR Take 40 mEq by mouth 2 (two) times daily.  . rosuvastatin (CRESTOR) 5 MG tablet TAKE 1 TABLET DAILY  . Selexipag (UPTRAVI) 1000 MCG TABS Take 1,000 mcg by mouth 2 (two) times daily.  . tadalafil, PAH, (ADCIRCA) 20 MG tablet Take 2 tablets (40 mg total) by mouth daily.  . Thiamine HCl (THIAMINE PO) Take 1 tablet by mouth daily. vitamin B-1  . torsemide (DEMADEX) 20 MG tablet Take 3 tablets (60 mg total) by mouth 2 (two) times daily.  Marland Kitchen ALPRAZolam (XANAX) 0.25 MG tablet Take 1 tablet (0.25 mg total) by mouth 2 (two) times daily as needed for anxiety or sleep. (Patient not taking: Reported on 03/22/2020)   No facility-administered encounter medications on file as of 03/22/2020.    Activities of Daily Living  In your present state of health, do you have any difficulty performing the  following activities: 03/22/2020 03/02/2020  Hearing? N N  Vision? N N  Difficulty concentrating or making decisions? N N  Walking or climbing stairs? N Y  Comment - Age related however pt does not use any assisted devices  Dressing or bathing? N N  Doing errands, shopping? Y Y  Comment - [place] assist with this Garment/textile technologist and eating ? N N  Using the Toilet? N N  In the past six months, have you accidently leaked urine? N N  Do you have problems with loss of bowel control? N N  Managing your Medications? N N  Managing your Finances? N N  Housekeeping or managing your Housekeeping? N Y  Comment - Daughter assist with this task  Some recent data might be hidden    Patient Care Team: Mosie Lukes, MD as PCP - General (Family Medicine) Bensimhon, Shaune Pascal, MD as PCP - Cardiology (Cardiology) Elsie Stain, MD as Consulting Physician (Pulmonary Disease) Bensimhon, Shaune Pascal, MD as Consulting Physician (Cardiology) Wyatt Portela, MD as Consulting Physician (Oncology) Milus Banister, MD as Consulting Physician (Gastroenterology) Gerlene Burdock and [facility] (Dentistry) Tobi Bastos, RN as [place] Management   Assessment:   This is a routine wellness examination for Annetta South. Physical assessment deferred to PCP.  Exercise Activities and Dietary recommendations Current Exercise Habits: Home exercise routine, Time (Minutes): 45, Frequency (Times/Week): 3, Weekly Exercise (Minutes/Week): 135, Intensity: Mild Diet (meal preparation, eat out, water intake, caffeinated beverages, dairy products, fruits and vegetables): in general, a "healthy" diet  , well balanced   Goals    . Maintain healthy lifestyle    . Reduce alcohol intake       Fall Risk Fall Risk  03/22/2020 03/02/2020 12/30/2018 05/07/2018 03/18/2018  Falls in the past year? 0 0 0 No No  Number falls in past yr: 0 - 0 - -  Comment - - - - -  Injury with Fall? 0 - 0 - -  Follow up  Education provided;Falls prevention discussed - - - -   Depression Screen PHQ 2/9 Scores 03/02/2020 03/18/2018 03/17/2017 06/20/2016  PHQ - 2 Score 0 0 1 0  Exception Documentation - - - -    Cognitive Function   MMSE - Mini Mental State Exam 03/17/2017  Orientation to time 5  Orientation to Place 5  Registration 3  Attention/ Calculation 4  Recall 3  Language- name 2 objects 2  Language- repeat 1  Language- follow 3 step command 3  Language- read & follow direction 1  Write a sentence 1  Copy design 1  Total score 29     6CIT Screen 03/22/2020  What Year? 0 points  What month? 0 points  What time? 0 points  Count back from 20 0 points  Months in reverse 2 points  Repeat phrase 0 points  Total Score 2    Immunization History  Administered Date(s) Administered  . Fluad Quad(high Dose 65+) 07/12/2019  . Hepatitis B 12/03/2012, 01/11/2013  . Hepatitis B, ped/adol 06/03/2013  . Influenza Split 08/28/2011, 11/03/2012  . Influenza, High Dose Seasonal PF 09/11/2016, 11/20/2017, 08/09/2018  . Influenza,inj,Quad PF,6+ Mos 07/18/2013, 07/25/2014, 09/03/2015  . Pneumococcal Conjugate-13 09/03/2015  . Pneumococcal Polysaccharide-23 08/30/2013, 07/12/2019  . Tdap 09/02/2013   Screening Tests Health Maintenance  Topic Date Due  . COVID-19 Vaccine (1) Never done  . INFLUENZA VACCINE  06/03/2020  . TETANUS/TDAP  09/03/2023  . COLONOSCOPY  04/27/2029  . Hepatitis C Screening  Completed  . PNA vac Low Risk Adult  Completed       Plan:   See you next year!  Continue to eat heart healthy diet (full of fruits, vegetables, whole grains, lean protein, water--limit salt, fat, and sugar intake) and increase physical activity as tolerated.  Continue doing brain stimulating activities (puzzles, reading, adult coloring books, staying active) to keep memory sharp.    I have personally reviewed and noted the following in the patient's chart:   . Medical and social history . Use of  alcohol, tobacco or illicit drugs  . Current medications and supplements . Functional ability and status . Nutritional status . Physical activity . Advanced directives . List of other physicians . Hospitalizations, surgeries, and ER visits in previous 12 months . Vitals . Screenings to include cognitive, depression, and falls . Referrals and appointments  In addition, I have reviewed and discussed with patient certain preventive protocols, quality metrics, and best practice recommendations. A written personalized care plan for preventive services as well as general preventive health recommendations were provided to patient.     Shela Nevin, [place]  03/22/2020

## 2020-03-22 ENCOUNTER — Encounter: Payer: Self-pay | Admitting: *Deleted

## 2020-03-22 ENCOUNTER — Other Ambulatory Visit: Payer: Self-pay

## 2020-03-22 ENCOUNTER — Telehealth: Payer: Self-pay | Admitting: *Deleted

## 2020-03-22 ENCOUNTER — Ambulatory Visit (INDEPENDENT_AMBULATORY_CARE_PROVIDER_SITE_OTHER): Payer: Medicare Other | Admitting: *Deleted

## 2020-03-22 ENCOUNTER — Ambulatory Visit (INDEPENDENT_AMBULATORY_CARE_PROVIDER_SITE_OTHER): Payer: Medicare Other | Admitting: Family Medicine

## 2020-03-22 VITALS — BP 126/84 | HR 81 | Temp 97.0°F | Ht 73.0 in | Wt 187.0 lb

## 2020-03-22 VITALS — BP 126/84 | HR 81 | Temp 97.8°F | Resp 12 | Ht 73.0 in | Wt 187.0 lb

## 2020-03-22 DIAGNOSIS — E876 Hypokalemia: Secondary | ICD-10-CM

## 2020-03-22 DIAGNOSIS — R739 Hyperglycemia, unspecified: Secondary | ICD-10-CM

## 2020-03-22 DIAGNOSIS — R197 Diarrhea, unspecified: Secondary | ICD-10-CM

## 2020-03-22 DIAGNOSIS — N184 Chronic kidney disease, stage 4 (severe): Secondary | ICD-10-CM

## 2020-03-22 DIAGNOSIS — F1029 Alcohol dependence with unspecified alcohol-induced disorder: Secondary | ICD-10-CM

## 2020-03-22 DIAGNOSIS — K7030 Alcoholic cirrhosis of liver without ascites: Secondary | ICD-10-CM | POA: Diagnosis not present

## 2020-03-22 DIAGNOSIS — D696 Thrombocytopenia, unspecified: Secondary | ICD-10-CM

## 2020-03-22 DIAGNOSIS — Z0000 Encounter for general adult medical examination without abnormal findings: Secondary | ICD-10-CM

## 2020-03-22 DIAGNOSIS — I10 Essential (primary) hypertension: Secondary | ICD-10-CM | POA: Diagnosis not present

## 2020-03-22 DIAGNOSIS — I50813 Acute on chronic right heart failure: Secondary | ICD-10-CM

## 2020-03-22 DIAGNOSIS — R7989 Other specified abnormal findings of blood chemistry: Secondary | ICD-10-CM

## 2020-03-22 DIAGNOSIS — F418 Other specified anxiety disorders: Secondary | ICD-10-CM

## 2020-03-22 LAB — CBC
HCT: 31.3 % — ABNORMAL LOW (ref 39.0–52.0)
Hemoglobin: 10.3 g/dL — ABNORMAL LOW (ref 13.0–17.0)
MCHC: 33 g/dL (ref 30.0–36.0)
MCV: 102.7 fl — ABNORMAL HIGH (ref 78.0–100.0)
Platelets: 28 10*3/uL — CL (ref 150.0–400.0)
RBC: 3.05 Mil/uL — ABNORMAL LOW (ref 4.22–5.81)
RDW: 18.6 % — ABNORMAL HIGH (ref 11.5–15.5)
WBC: 2.3 10*3/uL — ABNORMAL LOW (ref 4.0–10.5)

## 2020-03-22 LAB — COMPREHENSIVE METABOLIC PANEL
ALT: 31 U/L (ref 0–53)
AST: 56 U/L — ABNORMAL HIGH (ref 0–37)
Albumin: 3.3 g/dL — ABNORMAL LOW (ref 3.5–5.2)
Alkaline Phosphatase: 263 U/L — ABNORMAL HIGH (ref 39–117)
BUN: 36 mg/dL — ABNORMAL HIGH (ref 6–23)
CO2: 24 mEq/L (ref 19–32)
Calcium: 8.4 mg/dL (ref 8.4–10.5)
Chloride: 110 mEq/L (ref 96–112)
Creatinine, Ser: 1.99 mg/dL — ABNORMAL HIGH (ref 0.40–1.50)
GFR: 40.39 mL/min — ABNORMAL LOW (ref >60.00)
Glucose, Bld: 104 mg/dL — ABNORMAL HIGH (ref 70–99)
Potassium: 3.5 mEq/L (ref 3.5–5.1)
Sodium: 138 mEq/L (ref 135–145)
Total Bilirubin: 1.5 mg/dL — ABNORMAL HIGH (ref 0.2–1.2)
Total Protein: 6.5 g/dL (ref 6.0–8.3)

## 2020-03-22 LAB — AMMONIA: Ammonia: 83 umol/L — ABNORMAL HIGH (ref 11–35)

## 2020-03-22 NOTE — Telephone Encounter (Signed)
CRITICAL VALUE STICKER  CRITICAL VALUE: platlet count is 28,000

## 2020-03-22 NOTE — Patient Instructions (Signed)
See you next year!  Continue to eat heart healthy diet (full of fruits, vegetables, whole grains, lean protein, water--limit salt, fat, and sugar intake) and increase physical activity as tolerated.  Continue doing brain stimulating activities (puzzles, reading, adult coloring books, staying active) to keep memory sharp.    Darren Mueller , Thank you for taking time to come for your Medicare Wellness Visit. I appreciate your ongoing commitment to your health goals. Please review the following plan we discussed and let me know if I can assist you in the future.   These are the goals we discussed: Goals    . Maintain healthy lifestyle    . Reduce alcohol intake       This is a list of the screening recommended for you and due dates:  Health Maintenance  Topic Date Due  . COVID-19 Vaccine (1) Never done  . Flu Shot  06/03/2020  . Tetanus Vaccine  09/03/2023  . Colon Cancer Screening  04/27/2029  .  Hepatitis C: One time screening is recommended by Center for Disease Control  (CDC) for  adults born from [birth year] through 1966.   Completed  . Pneumonia vaccines  Completed    Preventive Care 34 Years and Older, Male Preventive care refers to lifestyle choices and visits with your health care provider that can promote health and wellness. This includes:  A yearly physical exam. This is also called an annual well check.  Regular dental and eye exams.  Immunizations.  Screening for certain conditions.  Healthy lifestyle choices, such as diet and exercise. What can I expect for my preventive care visit? Physical exam Your health care provider will check:  Height and weight. These may be used to calculate body mass index (BMI), which is a measurement that tells if you are at a healthy weight.  Heart rate and blood pressure.  Your skin for abnormal spots. Counseling Your health care provider may ask you questions about:  Alcohol, tobacco, and drug use.  Emotional well-being.  Home  and relationship well-being.  Sexual activity.  Eating habits.  History of falls.  Memory and ability to understand (cognition).  Work and work Statistician. What immunizations do I need?  Influenza (flu) vaccine  This is recommended every year. Tetanus, diphtheria, and pertussis (Tdap) vaccine  You may need a Td booster every 10 years. Varicella (chickenpox) vaccine  You may need this vaccine if you have not already been vaccinated. Zoster (shingles) vaccine  You may need this after age 50. Pneumococcal conjugate (PCV13) vaccine  One dose is recommended after age 69. Pneumococcal polysaccharide (PPSV23) vaccine  One dose is recommended after age 29. Measles, mumps, and rubella (MMR) vaccine  You may need at least one dose of MMR if you were born in 1957 or later. You may also need a second dose. Meningococcal conjugate (MenACWY) vaccine  You may need this if you have certain conditions. Hepatitis A vaccine  You may need this if you have certain conditions or if you travel or work in places where you may be exposed to hepatitis A. Hepatitis B vaccine  You may need this if you have certain conditions or if you travel or work in places where you may be exposed to hepatitis B. Haemophilus influenzae type b (Hib) vaccine  You may need this if you have certain conditions. You may receive vaccines as individual doses or as more than one vaccine together in one shot (combination vaccines). Talk with your health care provider about the  risks and benefits of combination vaccines. What tests do I need? Blood tests  Lipid and cholesterol levels. These may be checked every 5 years, or more frequently depending on your overall health.  Hepatitis C test.  Hepatitis B test. Screening  Lung cancer screening. You may have this screening every year starting at age 72 if you have a 30-pack-year history of smoking and currently smoke or have quit within the past 15 years.   Colorectal cancer screening. All adults should have this screening starting at age 12 and continuing until age 101. Your health care provider may recommend screening at age 54 if you are at increased risk. You will have tests every 1-10 years, depending on your results and the type of screening test.  Prostate cancer screening. Recommendations will vary depending on your family history and other risks.  Diabetes screening. This is done by checking your blood sugar (glucose) after you have not eaten for a while (fasting). You may have this done every 1-3 years.  Abdominal aortic aneurysm (AAA) screening. You may need this if you are a current or former smoker.  Sexually transmitted disease (STD) testing. Follow these instructions at home: Eating and drinking  Eat a diet that includes fresh fruits and vegetables, whole grains, lean protein, and low-fat dairy products. Limit your intake of foods with high amounts of sugar, saturated fats, and salt.  Take vitamin and mineral supplements as recommended by your health care provider.  Do not drink alcohol if your health care provider tells you not to drink.  If you drink alcohol: ? Limit how much you have to 0-2 drinks a day. ? Be aware of how much alcohol is in your drink. In the [place] one drink equals one 12 oz bottle of beer (355 mL), one 5 oz glass of wine (148 mL), or one 1 oz glass of hard liquor (44 mL). Lifestyle  Take daily care of your teeth and gums.  Stay active. Exercise for at least 30 minutes on 5 or more days each week.  Do not use any products that contain nicotine or tobacco, such as cigarettes, e-cigarettes, and chewing tobacco. If you need help quitting, ask your health care provider.  If you are sexually active, practice safe sex. Use a condom or other form of protection to prevent STIs (sexually transmitted infections).  Talk with your health care provider about taking a low-dose aspirin or statin. What's next?  Visit  your health care provider once a year for a well check visit.  Ask your health care provider how often you should have your eyes and teeth checked.  Stay up to date on all vaccines. This information is not intended to replace advice given to you by your health care provider. Make sure you discuss any questions you have with your health care provider. Document Revised: 10/14/2018 Document Reviewed: 10/14/2018 Elsevier Patient Education  2020 [employer].

## 2020-03-22 NOTE — Patient Instructions (Addendum)
Start the Fluoxetine daily til seen by cardiology. If increased loose stools occur can back off of the Lactulose temporarily until it improves. Often improves within 2 weeks. Diarrhea, Adult Diarrhea is frequent loose and watery bowel movements. Diarrhea can make you feel weak and cause you to become dehydrated. Dehydration can make you tired and thirsty, cause you to have a dry mouth, and decrease how often you urinate. Diarrhea typically lasts 2-3 days. However, it can last longer if it is a sign of something more serious. It is important to treat your diarrhea as told by your health care provider. Follow these instructions at home: Eating and drinking     Follow these recommendations as told by your health care provider:  Take an oral rehydration solution (ORS). This is an over-the-counter medicine that helps return your body to its normal balance of nutrients and water. It is found at pharmacies and retail stores.  Drink plenty of fluids, such as water, ice chips, diluted fruit juice, and low-calorie sports drinks. You can drink milk also, if desired.  Avoid drinking fluids that contain a lot of sugar or caffeine, such as energy drinks, sports drinks, and soda.  Eat bland, easy-to-digest foods in small amounts as you are able. These foods include bananas, applesauce, rice, lean meats, toast, and crackers.  Avoid alcohol.  Avoid spicy or fatty foods.  Medicines  Take over-the-counter and prescription medicines only as told by your health care provider.  If you were prescribed an antibiotic medicine, take it as told by your health care provider. Do not stop using the antibiotic even if you start to feel better. General instructions   Wash your hands often using soap and water. If soap and water are not available, use a hand sanitizer. Others in the household should wash their hands as well. Hands should be washed: ? After using the toilet or changing a diaper. ? Before preparing,  cooking, or serving food. ? While caring for a sick person or while visiting someone in a hospital.  Drink enough fluid to keep your urine pale yellow.  Rest at home while you recover.  Watch your condition for any changes.  Take a warm bath to relieve any burning or pain from frequent diarrhea episodes.  Keep all follow-up visits as told by your health care provider. This is important. Contact a health care provider if:  You have a fever.  Your diarrhea gets worse.  You have new symptoms.  You cannot keep fluids down.  You feel light-headed or dizzy.  You have a headache.  You have muscle cramps. Get help right away if:  You have chest pain.  You feel extremely weak or you faint.  You have bloody or black stools or stools that look like tar.  You have severe pain, cramping, or bloating in your abdomen.  You have trouble breathing or you are breathing very quickly.  Your heart is beating very quickly.  Your skin feels cold and clammy.  You feel confused.  You have signs of dehydration, such as: ? Dark urine, very little urine, or no urine. ? Cracked lips. ? Dry mouth. ? Sunken eyes. ? Sleepiness. ? Weakness. Summary  Diarrhea is frequent loose and watery bowel movements. Diarrhea can make you feel weak and cause you to become dehydrated.  Drink enough fluids to keep your urine pale yellow.  Make sure that you wash your hands after using the toilet. If soap and water are not available, use hand sanitizer.  Contact a health care provider if your diarrhea gets worse or you have new symptoms.  Get help right away if you have signs of dehydration. This information is not intended to replace advice given to you by your health care provider. Make sure you discuss any questions you have with your health care provider. Document Revised: 03/08/2019 Document Reviewed: 03/26/2018 Elsevier Patient Education  [place].

## 2020-03-25 NOTE — Assessment & Plan Note (Signed)
Hydrate and monitor

## 2020-03-25 NOTE — Assessment & Plan Note (Signed)
Asymptomatic, will continue to monitor

## 2020-03-25 NOTE — Assessment & Plan Note (Signed)
Has improved with use of Lactulose will continue to monitor and may alternate dosing from bid to qd prn

## 2020-03-25 NOTE — Assessment & Plan Note (Signed)
He continue to abstain, encouraged to persist

## 2020-03-25 NOTE — Assessment & Plan Note (Signed)
hgba1c acceptable, minimize simple carbs. Increase exercise as tolerated.

## 2020-03-25 NOTE — Assessment & Plan Note (Signed)
He continues to follow with cardiology closely, no changes in therapy recently

## 2020-03-25 NOTE — Assessment & Plan Note (Signed)
He has a prescription for an SSRI but he has been hesitant to start it. He is encouraged to do so and agrees.

## 2020-03-25 NOTE — Progress Notes (Signed)
Subjective:    Patient ID: Darren Mueller, male    DOB: 09/05/1951, 69 y.o.   MRN: [id number]  Chief Complaint  Patient presents with  . 6 week follow up    HPI Patient is in today for follow up on chronic medical concerns. No recent febrile illness or hospitalizations. No polyuria or polydipsia. He is struggling with anhedonia but has not started his Fluoxetine due to concerns about side effects. No suicidal ideation and he is abstaining from alcohol. He is tolerating the Lactulose and having a couple of loose stool daily. Denies CP/palp/SOB/HA/congestion/fevers or GU c/o. Taking meds as prescribed  Past Medical History:  Diagnosis Date  . Alcohol dependence in remission ([place]) 03/22/2011   In remission   . Alcoholic cirrhosis of liver with ascites ([place]) 2014  . Anginal pain ([place])    with SOB admitted 04/20/2019  . Cardiomegaly 02/2019   Noted on CXR  . CHF (congestive heart failure) ([place])   . CKD (chronic kidney disease), stage III 07/18/2013  . COPD (chronic obstructive pulmonary disease) ([place])   . Coronary artery disease   . GERD (gastroesophageal reflux disease)   . Gout   . Hearing loss   . Hypertension   . Hypertriglyceridemia 03/17/2017  . PAH (pulmonary artery hypertension) ([facility])    RV failure  . Pancytopenia ([place])   . Pleural effusion 2014   Noted on CT:   Moderately large simple layering right pleural effusion   . Pulmonary nodule 2014   Noted on CT: An 8 mm pulmonary nodule in the periphery of the left lower lobe  . Rectal bleeding   . Seizures ([place])   . Sleep apnea    does not wear CPAP  . Splenomegaly 12/2015   Noted on CT  . Thoracic aorta atherosclerosis ([place]) 12/2017  . Thrombocytopenia ([place]) 06/29/2016  . Unspecified hypothyroidism 01/25/2013  . Vertigo     Past Surgical History:  Procedure Laterality Date  . CARDIAC CATHETERIZATION N/A 12/21/2015   Procedure: Right Heart Cath;  Surgeon: Jolaine Artist, MD;  Location: [facility];  Service:  Cardiovascular;  Laterality: N/A;  . CARDIAC CATHETERIZATION N/A 04/15/2016   Procedure: Right/Left Heart Cath and Coronary Angiography;  Surgeon: Jolaine Artist, MD;  Location: [facility];  Service: Cardiovascular;  Laterality: N/A;  . COLONOSCOPY N/A 12/08/2013   Procedure: COLONOSCOPY;  Surgeon: Milus Banister, MD;  Location: [facility];  Service: Endoscopy;  Laterality: N/A;  . COLONOSCOPY WITH PROPOFOL N/A 03/24/2019   Procedure: COLONOSCOPY WITH PROPOFOL;  Surgeon: Milus Banister, MD;  Location: [facility];  Service: Endoscopy;  Laterality: N/A;  Draw CBC in preop  . COLONOSCOPY WITH PROPOFOL N/A 04/28/2019   Procedure: COLONOSCOPY WITH PROPOFOL;  Surgeon: Milus Banister, MD;  Location: [facility];  Service: Endoscopy;  Laterality: N/A;  . ESOPHAGOGASTRODUODENOSCOPY (EGD) WITH PROPOFOL N/A 03/27/2016   Procedure: ESOPHAGOGASTRODUODENOSCOPY (EGD) WITH PROPOFOL;  Surgeon: Milus Banister, MD;  Location: [facility];  Service: Endoscopy;  Laterality: N/A;  . ESOPHAGOGASTRODUODENOSCOPY (EGD) WITH PROPOFOL N/A 02/14/2019   Procedure: ESOPHAGOGASTRODUODENOSCOPY (EGD) WITH PROPOFOL;  Surgeon: Jerene Bears, MD;  Location: [facility];  Service: Gastroenterology;  Laterality: N/A;  . EYE SURGERY Bilateral 03/15/2019   lasix  . GAS/FLUID EXCHANGE Right 09/22/2019   Procedure: GAS/FLUID EXCHANGE (C3F8) RIGHT EYE;  Surgeon: Sherlynn Stalls, MD;  Location: [facility];  Service: Ophthalmology;  Laterality: Right;  . HEMOSTASIS CLIP PLACEMENT  04/28/2019   Procedure: HEMOSTASIS CLIP  PLACEMENT;  Surgeon: Milus Banister, MD;  Location: [facility];  Service: Endoscopy;;  . LOOP RECORDER INSERTION N/A 01/01/2017   Procedure: Loop Recorder Insertion;  Surgeon: Thompson Grayer, MD;  Location: [facility];  Service: Cardiovascular;  Laterality: N/A;  . PARS PLANA VITRECTOMY Right 09/22/2019   Procedure: PARS PLANA VITRECTOMY WITH 25 GAUGE RIGHT EYE;  Surgeon: Sherlynn Stalls, MD;  Location: [facility];  Service: Ophthalmology;  Laterality: Right;  . PHOTOCOAGULATION WITH LASER Right 09/22/2019   Procedure: PHOTOCOAGULATION WITH LASER RIGHT EYE;  Surgeon: Sherlynn Stalls, MD;  Location: [facility];  Service: Ophthalmology;  Laterality: Right;  . POLYPECTOMY  04/28/2019   Procedure: POLYPECTOMY;  Surgeon: Milus Banister, MD;  Location: [facility];  Service: Endoscopy;;  . RIGHT HEART CATHETERIZATION Right 06/06/2014   Procedure: RIGHT HEART CATH;  Surgeon: Jolaine Artist, MD;  [facility];  Service: Cardiovascular;  Laterality: Right;  . UVULOPALATOPHARYNGOPLASTY  1999    Family History  Problem Relation Age of Onset  . Heart disease Mother   . Heart attack Mother   . Hypertension Mother   . Kidney failure Mother   . Liver disease Mother        stage 4 liver disease  . Prostate cancer Father   . Cancer Father        lung  . Hypertension Brother   . Gout Brother   . Alcoholism Maternal Grandfather   . Liver disease Maternal Grandfather   . Migraines Sister   . Hypertension Brother   . Colon cancer Neg Hx     Social History   Socioeconomic History  . Marital status: Divorced    Spouse name: Not on file  . Number of children: 3  . Years of education: Not on file  . Highest education level: Not on file  Occupational History  . Occupation: Retired    Comment: Social research officer, government  Tobacco Use  . Smoking status: Former Smoker    Packs/day: 2.00    Years: 5.00    Pack years: 10.00    Types: Cigarettes    Quit date: 09/22/1979    Years since quitting: 40.5  . Smokeless tobacco: Never Used  Substance and Sexual Activity  . Alcohol use: Yes    Alcohol/week: 6.0 standard drinks    Types: 6 Shots of liquor per week    Comment: quit drinking in 12/2019  . Drug use: No  . Sexual activity: Yes    Birth control/protection: Spermicide  Other Topics Concern  . Not on file  Social History Narrative   0 caffeine drinks daily    Social Determinants of Health   Financial  Resource Strain: Low Risk   . Difficulty of Paying Living Expenses: Not hard at all  Food Insecurity: No Food Insecurity  . Worried About Charity fundraiser in the Last Year: Never true  . Ran Out of Food in the Last Year: Never true  Transportation Needs: No Transportation Needs  . Lack of Transportation (Medical): No  . Lack of Transportation (Non-Medical): No  Physical Activity:   . Days of Exercise per Week:   . Minutes of Exercise per Session:   Stress:   . Feeling of Stress :   Social Connections:   . Frequency of Communication with Friends and Family:   . Frequency of Social Gatherings with Friends and Family:   . Attends Religious Services:   . Active Member of Clubs or Organizations:   .  Attends Archivist Meetings:   Marland Kitchen Marital Status:   Intimate Partner Violence:   . Fear of Current or Ex-Partner:   . Emotionally Abused:   Marland Kitchen Physically Abused:   . Sexually Abused:     Outpatient Medications Prior to Visit  Medication Sig Dispense Refill  . acetaminophen (TYLENOL) 500 MG tablet Take 1 tablet (500 mg total) by mouth every 6 (six) hours as needed (for pain.). 30 tablet 0  . ALPRAZolam (XANAX) 0.25 MG tablet Take 1 tablet (0.25 mg total) by mouth 2 (two) times daily as needed for anxiety or sleep. (Patient not taking: Reported on 03/22/2020) 20 tablet 0  . Blood Pressure Monitoring (BLOOD PRESSURE CUFF) MISC Use as directed. Check blood pressure bid prn Dx:I10 1 each 0  . Ferrous Fumarate (HEMOCYTE) 324 (106 Fe) MG TABS tablet Take 1 tablet (106 mg of iron total) by mouth daily. 30 tablet 5  . FLUoxetine (PROZAC) 20 MG tablet Take 0.5-1 tablets (10-20 mg total) by mouth daily. 30 tablet 3  . folic acid (FOLVITE) 1 MG tablet Take 1 mg by mouth daily.    Marland Kitchen lactulose (CHRONULAC) 10 GM/15ML solution Take 30 mLs (20 g total) by mouth 2 (two) times daily. 300 mL 5  . macitentan (OPSUMIT) 10 MG tablet Take 1 tablet (10 mg total) by mouth daily. 90 tablet 3  . omeprazole  (PRILOSEC) 40 MG capsule TAKE 1 CAPSULE BY MOUTH ONCE DAILY 90 capsule 0  . Potassium Chloride ER 20 MEQ TBCR Take 40 mEq by mouth 2 (two) times daily.    . rosuvastatin (CRESTOR) 5 MG tablet TAKE 1 TABLET DAILY 90 tablet 3  . Selexipag (UPTRAVI) 1000 MCG TABS Take 1,000 mcg by mouth 2 (two) times daily. 60 tablet 11  . tadalafil, PAH, (ADCIRCA) 20 MG tablet Take 2 tablets (40 mg total) by mouth daily. 180 tablet 1  . Thiamine HCl (THIAMINE PO) Take 1 tablet by mouth daily. vitamin B-1    . torsemide (DEMADEX) 20 MG tablet Take 3 tablets (60 mg total) by mouth 2 (two) times daily. 180 tablet 5   No facility-administered medications prior to visit.    Allergies  Allergen Reactions  . Aspirin Hypertension  . Nitroglycerin Other (See Comments)    Severe Headache    Review of Systems  Constitutional: Positive for malaise/fatigue. Negative for fever.  HENT: Negative for congestion.   Eyes: Negative for blurred vision.  Respiratory: Negative for shortness of breath.   Cardiovascular: Negative for chest pain, palpitations and leg swelling.  Gastrointestinal: Positive for diarrhea. Negative for abdominal pain, blood in stool and nausea.  Genitourinary: Negative for dysuria and frequency.  Musculoskeletal: Negative for falls.  Skin: Negative for rash.  Neurological: Negative for dizziness, loss of consciousness and headaches.  Endo/Heme/Allergies: Negative for environmental allergies.  Psychiatric/Behavioral: Positive for depression. The patient is nervous/anxious.        Objective:    Physical Exam Vitals and nursing note reviewed.  Constitutional:      General: He is not in acute distress.    Appearance: He is well-developed.  HENT:     Head: Normocephalic and atraumatic.     Nose: Nose normal.  Eyes:     General:        Right eye: No discharge.        Left eye: No discharge.  Cardiovascular:     Rate and Rhythm: Normal rate and regular rhythm.  Pulmonary:     Effort:  Pulmonary  effort is normal.     Breath sounds: Normal breath sounds.  Abdominal:     General: Bowel sounds are normal.     Palpations: Abdomen is soft.     Tenderness: There is no abdominal tenderness.  Musculoskeletal:     Cervical back: Normal range of motion and neck supple.  Skin:    General: Skin is warm and dry.  Neurological:     Mental Status: He is alert and oriented to person, place, and time.     BP 126/84 (BP Location: Left Arm, Cuff Size: Normal)   Pulse 81   Temp 97.8 F (36.6 C) (Temporal)   Resp 12   Ht _0  (1.854 m)   Wt 187 lb (84.8 kg)   SpO2 96%   BMI 24.67 kg/m  Wt Readings from Last 3 Encounters:  03/22/20 187 lb (84.8 kg)  03/22/20 187 lb (84.8 kg)  03/12/20 184 lb (83.5 kg)    Diabetic Foot Exam - Simple   No data filed     Lab Results  Component Value Date   WBC 2.3 Repeated and verified X2. (L) 03/22/2020   HGB 10.3 (L) 03/22/2020   HCT 31.3 (L) 03/22/2020   PLT 28.0 Repeated and verified X2. (LL) 03/22/2020   GLUCOSE 104 (H) 03/22/2020   CHOL 145 10/11/2019   TRIG 74.0 10/11/2019   HDL 57.00 10/11/2019   LDLDIRECT 34.0 05/04/2018   LDLCALC 73 10/11/2019   ALT 31 03/22/2020   AST 56 (H) 03/22/2020   NA 138 03/22/2020   K 3.5 03/22/2020   CL 110 03/22/2020   CREATININE 1.99 (H) 03/22/2020   BUN 36 (H) 03/22/2020   CO2 24 03/22/2020   TSH 1.805 02/01/2020   PSA 0.49 11/01/2012   INR 1.2 02/26/2020   HGBA1C 4.8 04/13/2019    Lab Results  Component Value Date   TSH 1.805 02/01/2020   Lab Results  Component Value Date   WBC 2.3 Repeated and verified X2. (L) 03/22/2020   HGB 10.3 (L) 03/22/2020   HCT 31.3 (L) 03/22/2020   MCV 102.7 (H) 03/22/2020   PLT 28.0 Repeated and verified X2. (LL) 03/22/2020   Lab Results  Component Value Date   NA 138 03/22/2020   K 3.5 03/22/2020   CO2 24 03/22/2020   GLUCOSE 104 (H) 03/22/2020   BUN 36 (H) 03/22/2020   CREATININE 1.99 (H) 03/22/2020   BILITOT 1.5 (H) 03/22/2020   ALKPHOS  263 (H) 03/22/2020   AST 56 (H) 03/22/2020   ALT 31 03/22/2020   PROT 6.5 03/22/2020   ALBUMIN 3.3 (L) 03/22/2020   CALCIUM 8.4 03/22/2020   ANIONGAP 10 02/29/2020   GFR 40.39 (L) 03/22/2020   Lab Results  Component Value Date   CHOL 145 10/11/2019   Lab Results  Component Value Date   HDL 57.00 10/11/2019   Lab Results  Component Value Date   LDLCALC 73 10/11/2019   Lab Results  Component Value Date   TRIG 74.0 10/11/2019   Lab Results  Component Value Date   CHOLHDL 3 10/11/2019   Lab Results  Component Value Date   HGBA1C 4.8 04/13/2019       Assessment & Plan:   Problem List Items Addressed This Visit    Hypertension - Primary (Chronic)   Relevant Orders   CBC (Completed)   Stage 4 chronic kidney disease (HCC) (Chronic)    Hydrate and monitor      Relevant Orders   Comprehensive metabolic panel (  Completed)   Alcohol dependence ([place])    He continue to abstain, encouraged to persist      Hypokalemia   Relevant Orders   Comprehensive metabolic panel (Completed)   Hepatic cirrhosis ([place])   Relevant Orders   Ammonia (Completed)   Diarrhea   Relevant Orders   Comprehensive metabolic panel (Completed)   Thrombocytopenia ([facility])    Asymptomatic, will continue to monitor      Hyperglycemia    hgba1c acceptable, minimize simple carbs. Increase exercise as tolerated.       Acute on chronic right heart failure ([place])    He continues to follow with cardiology closely, no changes in therapy recently      Increased ammonia level    Has improved with use of Lactulose will continue to monitor and may alternate dosing from bid to qd prn       Depression with anxiety    He has a prescription for an SSRI but he has been hesitant to start it. He is encouraged to do so and agrees.          I am having [name] "Col" maintain his Blood Pressure Cuff, Uptravi, rosuvastatin, tadalafil (PAH), Potassium Chloride ER, torsemide, acetaminophen,  FLUoxetine, ALPRAZolam, lactulose, Ferrous Fumarate, folic acid, Thiamine HCl (THIAMINE PO), Opsumit, and omeprazole.  No orders of the defined types were placed in this encounter.    Penni Homans, MD

## 2020-03-26 ENCOUNTER — Ambulatory Visit (HOSPITAL_COMMUNITY)
Admission: RE | Admit: 2020-03-26 | Discharge: 2020-03-26 | Disposition: A | Discharge status: Home / Self Care | Payer: Medicare Other | Source: Ambulatory Visit | Attending: Cardiology | Admitting: Cardiology

## 2020-03-26 ENCOUNTER — Other Ambulatory Visit: Payer: Self-pay

## 2020-03-26 ENCOUNTER — Encounter (HOSPITAL_COMMUNITY): Payer: Self-pay

## 2020-03-26 ENCOUNTER — Ambulatory Visit: Payer: Medicare Other | Admitting: Physical Therapy

## 2020-03-26 VITALS — BP 126/82 | HR 71 | Wt 192.4 lb

## 2020-03-26 DIAGNOSIS — I2510 Atherosclerotic heart disease of native coronary artery without angina pectoris: Secondary | ICD-10-CM | POA: Diagnosis not present

## 2020-03-26 DIAGNOSIS — J449 Chronic obstructive pulmonary disease, unspecified: Secondary | ICD-10-CM | POA: Diagnosis not present

## 2020-03-26 DIAGNOSIS — I5032 Chronic diastolic (congestive) heart failure: Secondary | ICD-10-CM | POA: Diagnosis not present

## 2020-03-26 DIAGNOSIS — R55 Syncope and collapse: Secondary | ICD-10-CM | POA: Insufficient documentation

## 2020-03-26 DIAGNOSIS — Z8249 Family history of ischemic heart disease and other diseases of the circulatory system: Secondary | ICD-10-CM | POA: Insufficient documentation

## 2020-03-26 DIAGNOSIS — I130 Hypertensive heart and chronic kidney disease with heart failure and stage 1 through stage 4 chronic kidney disease, or unspecified chronic kidney disease: Secondary | ICD-10-CM | POA: Diagnosis not present

## 2020-03-26 DIAGNOSIS — I2721 Secondary pulmonary arterial hypertension: Secondary | ICD-10-CM | POA: Diagnosis not present

## 2020-03-26 DIAGNOSIS — F1021 Alcohol dependence, in remission: Secondary | ICD-10-CM | POA: Insufficient documentation

## 2020-03-26 DIAGNOSIS — G4733 Obstructive sleep apnea (adult) (pediatric): Secondary | ICD-10-CM | POA: Diagnosis not present

## 2020-03-26 DIAGNOSIS — E039 Hypothyroidism, unspecified: Secondary | ICD-10-CM | POA: Diagnosis not present

## 2020-03-26 DIAGNOSIS — K219 Gastro-esophageal reflux disease without esophagitis: Secondary | ICD-10-CM | POA: Diagnosis not present

## 2020-03-26 DIAGNOSIS — Z87891 Personal history of nicotine dependence: Secondary | ICD-10-CM | POA: Diagnosis not present

## 2020-03-26 DIAGNOSIS — E781 Pure hyperglyceridemia: Secondary | ICD-10-CM | POA: Insufficient documentation

## 2020-03-26 DIAGNOSIS — Z79899 Other long term (current) drug therapy: Secondary | ICD-10-CM | POA: Insufficient documentation

## 2020-03-26 DIAGNOSIS — N1831 Chronic kidney disease, stage 3a: Secondary | ICD-10-CM | POA: Insufficient documentation

## 2020-03-26 DIAGNOSIS — D61818 Other pancytopenia: Secondary | ICD-10-CM | POA: Insufficient documentation

## 2020-03-26 DIAGNOSIS — R569 Unspecified convulsions: Secondary | ICD-10-CM | POA: Diagnosis not present

## 2020-03-26 NOTE — Progress Notes (Signed)
PCP: Dr Charlett Blake Primary Cardiologist: Dr Haroldine Laws   HPI: Darren Mueller is a 69  year old with a history of COPD, PAH, RV failure, cirrhosis, and ETOH abuse.    Admitted in 6/17 with CP and SOB. Underwent R/L cath. Minimal CAD with moderate PAH and preserved cardiac output.   Admitted 06/2016 and 07/2016 with syncope. On September admission found to have elevated ETOH level as well as R 6th rib fracture. Wore 30 day monitor up until 08/28/16. No AF noted. No events.   Admitted February2018after syncopal event. LINQ placed.   Was admitted 4/20 with hematochezia and melena. His hemoglobin remained stable. No episodes of GI bleed after hospitalization. Underwent EGD by GI with finding of Grade 1 and a small esophageal varices, no evidence of recent bleeding,portal hypertensive gastropathy. Started on nadolol which made him very dizzy. He stopped this and feels a lot  Admitted 7/13-16/20 with volume overload. Diuresed 21 pounds. D/c weight 192.  Echo 05/17/19: LVEF >65% RV severely dilated with moderate RV dysfunction. Moderate TR RVSP 23mHG. While in hospital was pancytopenic. Possibility of BMBx discussed with Dr. SAlen Blewbut felt it was not necessary as it was felt he had splenic sequestration and counts were low but very stable.  On triple therapy for PCrestonUptravi Macitentan  Adcirca   Admitted in March with increased shortness of breath. Diuresed with IV lasix and later transitioned to torsemide 60 mg twice a day. Hospital course complicated by AKI. Discharged on 01/26/20 with weight 199 pounds.    [facility] edema. Weight has gone  up 20 pounds since discharge.Says he has been taking all medications.  Admitted with cellulitis and A/C RV failure. Diuresed with IV lasix and transitioned to torsemide.  Discharge weight 184 pounds.   Readmitted 02/25/20 with AMS. Ammonia was high, 112. Improved with lactulose.    [facility] Red Clip  Readings from [facility]  02/23/20 37%, 02/24/20 33%, and 03/01/20 32%.   He presents to clinic today for f/u. Here w/ his son. Wt is up 9 lb from previous OV. Has mild LEE on exam. He denies exertional dyspnea and no resting symptoms, but his son notes that he has appeared a little more winded/ fatigue looking w/ activity. He admits to recent dietary indiscretion w/ sodium. Had a [employer] from [employer] days ago. Also recently missed an evening dose of torsemide.   He also saw his PCP late last week who checked labs. CBC appeared worse. WBC 2.3. Platelets 28K. Hgb 10.3. He denies bleeding. BMP showed improved SCr, down from 2.26>>1.99. Ammonia level was also elevated but acceptable at 83. He reports daily compliance w/ lactulose. He denies any confusion. His son also denies any noted confusion/ AMS.    Studies: ECHO 12/14 EF 55-60% Peak PA pressure 35. Severe RV dysfunction  ECHO 7/15 EF 60% RV moderately to severely dilated. Moderate HK RVSP 572mHG  ECHO 6/16 EF 60% RV moderately to severely dilated. Severe HK RVSP ~65 mm HG. D-shaped septum Echo 2/17 LVEF 60-65% RV massively dilated. Flat septum. Severe HK. Moderate TR RVSP ~65. IVC small. No effusion  ECHO 01/01/2017 EF 50-55% Grade I DD. RV severely dilated. Peak PA pressure 62 mm hg Echo 9/19 with EF 60-65% RV markedly dilated and hypokinetic with D-shaped septum. RVSP 6864m Echo 01/2020 EF 60-65% Grade IIDD, RVSP 83   ROS: All systems negative except as listed in HPI, PMH and Problem List.  SH:  Social History   Socioeconomic History  . Marital status: Divorced    Spouse name: Not on file  . Number of children: 3  . Years of education: Not on file  . Highest education level: Not on file  Occupational History  . Occupation: Retired    Comment: Social research officer, government  Tobacco Use  . Smoking status: Former Smoker    Packs/day: 2.00    Years: 5.00    Pack years: 10.00    Types: Cigarettes    Quit date: 09/22/1979    Years since quitting:  40.5  . Smokeless tobacco: Never Used  Substance and Sexual Activity  . Alcohol use: Yes    Alcohol/week: 6.0 standard drinks    Types: 6 Shots of liquor per week    Comment: quit drinking in 12/2019  . Drug use: No  . Sexual activity: Yes    Birth control/protection: Spermicide  Other Topics Concern  . Not on file  Social History Narrative   0 caffeine drinks daily    Social Determinants of Health   Financial Resource Strain: Low Risk   . Difficulty of Paying Living Expenses: Not hard at all  Food Insecurity: No Food Insecurity  . Worried About Charity fundraiser in the Last Year: Never true  . Ran Out of Food in the Last Year: Never true  Transportation Needs: No Transportation Needs  . Lack of Transportation (Medical): No  . Lack of Transportation (Non-Medical): No  Physical Activity:   . Days of Exercise per Week:   . Minutes of Exercise per Session:   Stress:   . Feeling of Stress :   Social Connections:   . Frequency of Communication with Friends and Family:   . Frequency of Social Gatherings with Friends and Family:   . Attends Religious Services:   . Active Member of Clubs or Organizations:   . Attends Archivist Meetings:   Marland Kitchen Marital Status:   Intimate Partner Violence:   . Fear of Current or Ex-Partner:   . Emotionally Abused:   Marland Kitchen Physically Abused:   . Sexually Abused:     FH:  Family History  Problem Relation Age of Onset  . Heart disease Mother   . Heart attack Mother   . Hypertension Mother   . Kidney failure Mother   . Liver disease Mother        stage 4 liver disease  . Prostate cancer Father   . Cancer Father        lung  . Hypertension Brother   . Gout Brother   . Alcoholism Maternal Grandfather   . Liver disease Maternal Grandfather   . Migraines Sister   . Hypertension Brother   . Colon cancer Neg Hx     Past Medical History:  Diagnosis Date  . Alcohol dependence in remission ([place]) 03/22/2011   In remission   .  Alcoholic cirrhosis of liver with ascites ([place]) 2014  . Anginal pain ([place])    with SOB admitted 04/20/2019  . Cardiomegaly 02/2019   Noted on CXR  . CHF (congestive heart failure) ([place])   . CKD (chronic kidney disease), stage III 07/18/2013  . COPD (chronic obstructive pulmonary disease) ([place])   . Coronary artery disease   . GERD (gastroesophageal reflux disease)   . Gout   . Hearing loss   . Hypertension   . Hypertriglyceridemia 03/17/2017  . PAH (pulmonary artery hypertension) ([facility])    RV failure  . Pancytopenia ([place])   .  Pleural effusion 2014   Noted on CT:   Moderately large simple layering right pleural effusion   . Pulmonary nodule 2014   Noted on CT: An 8 mm pulmonary nodule in the periphery of the left lower lobe  . Rectal bleeding   . Seizures ([place])   . Sleep apnea    does not wear CPAP  . Splenomegaly 12/2015   Noted on CT  . Thoracic aorta atherosclerosis ([place]) 12/2017  . Thrombocytopenia ([place]) 06/29/2016  . Unspecified hypothyroidism 01/25/2013  . Vertigo     Current Outpatient Medications  Medication Sig Dispense Refill  . acetaminophen (TYLENOL) 500 MG tablet Take 1 tablet (500 mg total) by mouth every 6 (six) hours as needed (for pain.). 30 tablet 0  . Blood Pressure Monitoring (BLOOD PRESSURE CUFF) MISC Use as directed. Check blood pressure bid prn Dx:I10 1 each 0  . Ferrous Fumarate (HEMOCYTE) 324 (106 Fe) MG TABS tablet Take 1 tablet (106 mg of iron total) by mouth daily. 30 tablet 5  . folic acid (FOLVITE) 1 MG tablet Take 1 mg by mouth daily.    Marland Kitchen lactulose (CHRONULAC) 10 GM/15ML solution Take 30 mLs (20 g total) by mouth 2 (two) times daily. 300 mL 5  . macitentan (OPSUMIT) 10 MG tablet Take 1 tablet (10 mg total) by mouth daily. 90 tablet 3  . omeprazole (PRILOSEC) 40 MG capsule TAKE 1 CAPSULE BY MOUTH ONCE DAILY 90 capsule 0  . Potassium Chloride ER 20 MEQ TBCR Take 40 mEq by mouth 2 (two) times daily.    . rosuvastatin (CRESTOR) 5 MG tablet TAKE 1 TABLET  DAILY 90 tablet 3  . Selexipag (UPTRAVI) 1000 MCG TABS Take 1,000 mcg by mouth 2 (two) times daily. 60 tablet 11  . tadalafil, PAH, (ADCIRCA) 20 MG tablet Take 2 tablets (40 mg total) by mouth daily. 180 tablet 1  . Thiamine HCl (THIAMINE PO) Take 1 tablet by mouth daily. vitamin B-1    . torsemide (DEMADEX) 20 MG tablet Take 3 tablets (60 mg total) by mouth 2 (two) times daily. 180 tablet 5  . ALPRAZolam (XANAX) 0.25 MG tablet Take 1 tablet (0.25 mg total) by mouth 2 (two) times daily as needed for anxiety or sleep. (Patient not taking: Reported on 03/26/2020) 20 tablet 0  . FLUoxetine (PROZAC) 20 MG tablet Take 0.5-1 tablets (10-20 mg total) by mouth daily. (Patient not taking: Reported on 03/26/2020) 30 tablet 3   No current facility-administered medications for this encounter.    Vitals:   03/26/20 1149  BP: 126/82  Pulse: 71  SpO2: 99%  Weight: 87.3 kg (192 lb 6.4 oz)   Wt Readings from Last 3 Encounters:  03/26/20 87.3 kg (192 lb 6.4 oz)  03/22/20 84.8 kg (187 lb)  03/22/20 84.8 kg (187 lb)   PHYSICAL EXAM: General:  Well appearing. No respiratory difficulty HEENT: normal Neck: supple. no JVD. Carotids 2+ bilat; no bruits. No lymphadenopathy or thyromegaly appreciated. Cor: PMI nondisplaced. Regular rate & rhythm. No rubs, gallops or murmurs. Lungs: clear Abdomen: soft, nontender, mildly distended. No hepatosplenomegaly. No bruits or masses. Good bowel sounds. Extremities: no cyanosis, clubbing, rash, 1+ bilateral LEE edema Neuro: alert & oriented x 3, cranial nerves grossly intact. moves all 4 extremities w/o difficulty. Affect pleasant.    ASSESSMENT & PLAN:  1. Chronic Diastolic Heart Failure  ECHO 01/2020 EF 60-65% Grade IIDD - chronically NYHA III. Volume status elevated. Wt up 9 lb w/ 1+ bilateral LEE, in the setting  of recent dietary indiscretion w/ sodium and missed dose of torsemide - BMP done at PCP office reviewed. SCr improved, from 2.26>>1.99. K normal, 3.5 -  Will increase torsemide to 80 mg bid x 3 days then return to 60 mg bid thereafter. Also increase KCl to 60 mEq bid x 3 days>>40 mEq bid thereafter - We discussed importance of sodium restriction and strict compliance w/ diuretic to prevent readmission  2. PAH ECHO 01/2020 EF 60-65% RVSP 83 Grade II DD On triple therapy- selexipag 1000 mcg twice a day. Intolerant higher dose due to diarrhea and headaches . - Continue macitentan 10 daily and adcirca 40 mg daily.  3. CKD Stage IIIa  - I reviewed BMET from 03/22/20. SCr had improved, down to 1.99 - will repeat BMP in 1 week w/ planned diuretic dose adjustment   4. Headaches  - HAs could definitely come from selexipag but would be unusual to get worse without changing dose. - he has chronic pancytopenia due to liver disease (followed by Dr. Alen Blew) - No recent issues.   5.  Recurrent syncope/seizure - LINQ interrogations have shown no arrhythmias and thus most likely seizure - Continue Keppra. Follows with Dr. Delice Lesch  6. OSA:  - Has mild OSA with AHI 12 and desats down to 82%.  - Intolerant CPAP.  7. Pancytopenia:  - Has seen Dr. Alen Blew in Hematology but no f/u since 06/2019 - recent CBC shows worsening pancytopenia. Plt down to 28 K but denies abnormal bleeding. Hgb ~10. He has been advised to f/u again w/ Dr. Alen Blew   8. Elevated Ammonia - Continue daily lactulose.  - PCP following - He and his son both deny any recent confusion/ AMS   F/u in 2 weeks     Darren Knotek PA-C 12:11 PM

## 2020-03-26 NOTE — Patient Instructions (Signed)
INCREASE Torsemide to 80 mg twice a day for 3 days then resume normal dose INCREASE Potassium to 60 meq twice a day for 3 days, then resume normal dose  Labs needed in one week  Your physician recommends that you schedule a follow-up appointment in: 3-4 weeks  in the Advanced Practitioners (PA/NP) [facility], you and your health needs are our priority. As part of our continuing mission to provide you with exceptional heart care, we have created designated Provider Care Teams. These Care Teams include your primary Cardiologist (physician) and Advanced Practice Providers (APPs- Physician Assistants and Nurse Practitioners) who all work together to provide you with the care you need, when you need it.   You may see any of the following providers on your designated Care Team at your next follow up: Marland Kitchen Dr Glori Bickers . Dr Loralie Champagne . Darrick Grinder, NP . [name], PA . Audry Riles, PharmD   Please be sure to bring in all your medications bottles to every appointment.

## 2020-03-27 ENCOUNTER — Other Ambulatory Visit: Payer: Self-pay | Admitting: Family Medicine

## 2020-03-27 ENCOUNTER — Telehealth: Payer: Self-pay

## 2020-03-27 DIAGNOSIS — K7030 Alcoholic cirrhosis of liver without ascites: Secondary | ICD-10-CM

## 2020-03-27 DIAGNOSIS — F1029 Alcohol dependence with unspecified alcohol-induced disorder: Secondary | ICD-10-CM

## 2020-03-27 NOTE — Telephone Encounter (Signed)
Patient Darren Mueller  called in to speak with the nurse or Dr. Charlett Blake seeking advise for a medical question. Please give  ,Darren Mueller a call back at [phone]

## 2020-03-27 NOTE — Telephone Encounter (Signed)
Left message on machine to call back  

## 2020-03-27 NOTE — Telephone Encounter (Signed)
Pt called , requesting a call back from [place]

## 2020-03-28 ENCOUNTER — Other Ambulatory Visit: Payer: Self-pay

## 2020-03-28 ENCOUNTER — Encounter: Payer: Self-pay | Admitting: Physical Therapy

## 2020-03-28 ENCOUNTER — Ambulatory Visit: Payer: Medicare Other | Admitting: Physical Therapy

## 2020-03-28 DIAGNOSIS — M545 Low back pain, unspecified: Secondary | ICD-10-CM

## 2020-03-28 DIAGNOSIS — R2681 Unsteadiness on feet: Secondary | ICD-10-CM

## 2020-03-28 DIAGNOSIS — M25552 Pain in left hip: Secondary | ICD-10-CM

## 2020-03-28 DIAGNOSIS — R293 Abnormal posture: Secondary | ICD-10-CM

## 2020-03-28 DIAGNOSIS — R2689 Other abnormalities of gait and mobility: Secondary | ICD-10-CM

## 2020-03-28 MED FILL — GENERLAC 10 GM/15 ML SOLN: 10 | 5 days supply | Qty: 300 | Fill #3

## 2020-03-28 MED FILL — FLUOXETINE HCL 20 MG TABS: 20 | 30 days supply | Qty: 30 | Fill #1

## 2020-03-28 NOTE — Therapy (Signed)
[facility] [address]  [place], [place], [address] Phone: [phone]   Fax:  [phone]  Physical Therapy Treatment  Patient Details  Name: Darren Mueller MRN: [id number] Date of Birth: 1951/03/24 Referring Provider (PT): Mosie Lukes, MD   Encounter Date: 03/28/2020  PT End of Session - 03/28/20 1104    Visit Number  8    Number of Visits  16    Date for PT Re-Evaluation  04/16/20    Authorization Type  Medicare & [employer] for Life    Progress Note Due on Visit  16   MD progress note completed on 7th visit - 03/19/20   PT Start Time  1104    PT Stop Time  1146    PT Time Calculation (min)  42 min    Activity Tolerance  Patient tolerated treatment well    Behavior During Therapy  [facility] for tasks assessed/performed       Past Medical History:  Diagnosis Date  . Alcohol dependence in remission ([place]) 03/22/2011   In remission   . Alcoholic cirrhosis of liver with ascites ([place]) 2014  . Anginal pain ([place])    with SOB admitted 04/20/2019  . Cardiomegaly 02/2019   Noted on CXR  . CHF (congestive heart failure) ([place])   . CKD (chronic kidney disease), stage III 07/18/2013  . COPD (chronic obstructive pulmonary disease) ([place])   . Coronary artery disease   . GERD (gastroesophageal reflux disease)   . Gout   . Hearing loss   . Hypertension   . Hypertriglyceridemia 03/17/2017  . PAH (pulmonary artery hypertension) ([facility])    RV failure  . Pancytopenia ([place])   . Pleural effusion 2014   Noted on CT:   Moderately large simple layering right pleural effusion   . Pulmonary nodule 2014   Noted on CT: An 8 mm pulmonary nodule in the periphery of the left lower lobe  . Rectal bleeding   . Seizures ([place])   . Sleep apnea    does not wear CPAP  . Splenomegaly 12/2015   Noted on CT  . Thoracic aorta atherosclerosis ([place]) 12/2017  . Thrombocytopenia ([place]) 06/29/2016  . Unspecified hypothyroidism 01/25/2013  . Vertigo      Past Surgical History:  Procedure Laterality Date  . CARDIAC CATHETERIZATION N/A 12/21/2015   Procedure: Right Heart Cath;  Surgeon: Jolaine Artist, MD;  Location: [facility];  Service: Cardiovascular;  Laterality: N/A;  . CARDIAC CATHETERIZATION N/A 04/15/2016   Procedure: Right/Left Heart Cath and Coronary Angiography;  Surgeon: Jolaine Artist, MD;  Location: [facility];  Service: Cardiovascular;  Laterality: N/A;  . COLONOSCOPY N/A 12/08/2013   Procedure: COLONOSCOPY;  Surgeon: Milus Banister, MD;  Location: [facility];  Service: Endoscopy;  Laterality: N/A;  . COLONOSCOPY WITH PROPOFOL N/A 03/24/2019   Procedure: COLONOSCOPY WITH PROPOFOL;  Surgeon: Milus Banister, MD;  Location: [facility];  Service: Endoscopy;  Laterality: N/A;  Draw CBC in preop  . COLONOSCOPY WITH PROPOFOL N/A 04/28/2019   Procedure: COLONOSCOPY WITH PROPOFOL;  Surgeon: Milus Banister, MD;  Location: [facility];  Service: Endoscopy;  Laterality: N/A;  . ESOPHAGOGASTRODUODENOSCOPY (EGD) WITH PROPOFOL N/A 03/27/2016   Procedure: ESOPHAGOGASTRODUODENOSCOPY (EGD) WITH PROPOFOL;  Surgeon: Milus Banister, MD;  Location: [facility];  Service: Endoscopy;  Laterality: N/A;  . ESOPHAGOGASTRODUODENOSCOPY (EGD) WITH PROPOFOL N/A 02/14/2019   Procedure: ESOPHAGOGASTRODUODENOSCOPY (EGD) WITH PROPOFOL;  Surgeon: Zenovia Jarred  M, MD;  Location: [facility];  Service: Gastroenterology;  Laterality: N/A;  . EYE SURGERY Bilateral 03/15/2019   lasix  . GAS/FLUID EXCHANGE Right 09/22/2019   Procedure: GAS/FLUID EXCHANGE (C3F8) RIGHT EYE;  Surgeon: Sherlynn Stalls, MD;  Location: [facility];  Service: Ophthalmology;  Laterality: Right;  . HEMOSTASIS CLIP PLACEMENT  04/28/2019   Procedure: HEMOSTASIS CLIP PLACEMENT;  Surgeon: Milus Banister, MD;  Location: [facility];  Service: Endoscopy;;  . LOOP RECORDER INSERTION N/A 01/01/2017   Procedure: Loop Recorder Insertion;  Surgeon: Thompson Grayer, MD;  Location: [facility];  Service: Cardiovascular;  Laterality: N/A;  . PARS PLANA VITRECTOMY Right 09/22/2019   Procedure: PARS PLANA VITRECTOMY WITH 25 GAUGE RIGHT EYE;  Surgeon: Sherlynn Stalls, MD;  Location: [facility];  Service: Ophthalmology;  Laterality: Right;  . PHOTOCOAGULATION WITH LASER Right 09/22/2019   Procedure: PHOTOCOAGULATION WITH LASER RIGHT EYE;  Surgeon: Sherlynn Stalls, MD;  Location: [facility];  Service: Ophthalmology;  Laterality: Right;  . POLYPECTOMY  04/28/2019   Procedure: POLYPECTOMY;  Surgeon: Milus Banister, MD;  Location: [facility];  Service: Endoscopy;;  . RIGHT HEART CATHETERIZATION Right 06/06/2014   Procedure: RIGHT HEART CATH;  Surgeon: Jolaine Artist, MD;  [facility];  Service: Cardiovascular;  Laterality: Right;  . UVULOPALATOPHARYNGOPLASTY  1999    There were no vitals filed for this visit.  Subjective Assessment - 03/28/20 1107    Subjective  Pt reports his cardiologist temporarily adjusted his torsemide dose for 3-days (today is final day) to keep his ammonia level down and keep him out of the hospital, but notes this reduces his endurance.    Pertinent History  PMH significant for CHF, CAD, COPD, CKD, HTN, aortopulmonary hypertension, heart murmur, HLD, cellulitis, vertigo, seizures, gout, ETOH abuse with ETOH cirrhosis and small esophageal varices    Currently in Pain?  No/denies                        [facility] Adult PT Treatment/Exercise - 03/28/20 1104      Lumbar Exercises: Aerobic   Nustep  L3 x 6 min (UE/LE)   seat #14     Lumbar Exercises: Standing   Scapular Retraction  Both;15 reps;AAROM    Scapular Retraction Limitations  postural correction with back against pool noodle on wall pressing shoulders and head back toward noodle/wall while retracting scapulae into pool noodle on wall      Lumbar Exercises: Seated   Long Arc Quad on [employer]  Both;10 reps    LAQ on [employer] (lbs)  seated on dynadisc on mat table    Hip  Flexion on Ball  Both;10 reps    Hip Flexion on Ball Limitations  seated on dynadisc on mat table - attempted 2nd set with alt UE/LE lifts but unable to appropriately coordinate movement with opp UE/LE    Other Seated Lumbar Exercises  B red TB rows, retraction/shoulder extension and horiz abd, R/L pallof press x 10 each - seated on dynadisc on mat table      Shoulder Exercises: ROM/Strengthening   Cybex Row  10 reps   3 sets   Cybex Row Limitations  B low row; L & R single arm rows - focusing on upright posture with abdominal activation for core stability and scap retraction for improved posture               PT Short Term Goals - 03/19/20 1117  PT SHORT TERM GOAL #1   Title  Patient will be independent with initial HEP    Status  Achieved   03/12/20     PT SHORT TERM GOAL #2   Title  Patient to report L hip/low back pain reduction in frequency and intensity by >/= 25-50%    Status  Achieved   03/12/20     PT SHORT TERM GOAL #3   Title  Patient will increase gait speed to >/= 2.0 ft/sec with improved posture and gait pattern to increase safety with community ambulation    Status  Achieved   03/19/20: gait speed = 3.27 ft/sec     PT SHORT TERM GOAL #4   Title  Patient will demonstrate decreased TUG time to </= 20 sec to decrease risk for falls with transitional mobility    Status  Achieved   03/19/20: TUG = 14.06 sec       PT Long Term Goals - 03/19/20 1127      PT LONG TERM GOAL #1   Title  Patient will be independent with ongoing/advanced HEP    Status  On-going   03/19/20: limited updates to HEP as pt has had limited opportunity to work on initial HEP due to MD appts and medical issues   Target Date  04/16/20      PT LONG TERM GOAL #2   Title  Patient to report L hip/low back pain reduction in frequency and intensity by >/= 50-75%    Status  Achieved   03/12/20     PT LONG TERM GOAL #3   Title  Patient will demonstrate improved B LE strength to >/= 4 to 4+/5  for improved stability and ease of mobility    Status  Partially Met    Target Date  04/16/20      PT LONG TERM GOAL #4   Title  Patient will increase gait speed to >/= 2.62 ft/sec with good posture and normal gait pattern to increase safety with community ambulation    Status  Partially Met   03/19/20: Met for gait speed (3.27 ft/sec) but still with tendency for head down posture   Target Date  04/16/20      PT LONG TERM GOAL #5   Title  Patient will demonstrate decreased TUG time to </= 13.5 sec to decrease risk for falls with transitional mobility    Baseline  TUG = 27.62 sec    Status  On-going   03/19/20: TIG = 14.06 sec   Target Date  04/16/20      PT LONG TERM GOAL #6   Title  Patient will improve Berg score to >/= 51/56 to improve safety stability with ADLs in standing and reduce risk for falls    Baseline  Baseline: Berg = 38/56; Initial LTG: Patient will improve Berg score to >/= 47/56 to improve safety stability with ADLs in standing and reduce risk for falls (met 03/19/20 & revised)    Status  Revised   03/19/20: Merrilee Jansky = 47/56 - goal revised to reflect potential for further improvement   Target Date  04/16/20            Plan - 03/28/20 1111    Clinical Impression Statement  [name] reports less energy/endurance and increased unsteadiness from recent changes in his meds. Deferred FGA today at patient's request due to fatigue with therapeutic exercises and activities targeting postural stability, core strengthening and balance, predominantly from seated position today. Frequent cues necessary to encourage  trunk extension as well as scapular and cervical retraction to promote good upright posture with most activities with intermittent rest breaks necessary due to fatigue.    Personal Factors and Comorbidities  Comorbidity 3+;Past/Current Experience;Time since onset of injury/illness/exacerbation;Age;Fitness    Comorbidities  PMH significant for CHF, CAD, COPD, CKD, HTN,  aortopulmonary hypertension, heart murmur, HLD, cellulitis, vertigo, seizures, gout, ETOH abuse with ETOH cirrhosis and small esophageal varices    Examination-Activity Limitations  Bend;Lift;Locomotion Level;Sit;Squat;Stand    Examination-Participation Restrictions  Cleaning;Community Activity;Laundry;Meal Prep    Rehab Potential  Good    PT Frequency  2x / week    PT Duration  8 weeks    PT Treatment/Interventions  ADLs/Self Care Home Management;Cryotherapy;Electrical Stimulation;Moist Heat;DME Instruction;Gait training;Stair training;Functional mobility training;Therapeutic activities;Therapeutic exercise;Balance training;Neuromuscular re-education;Patient/family education;Manual techniques;Passive range of motion;Dry needling;Taping;Joint Manipulations    PT Next Visit Plan  Monitor VS; possible FGA assessment; LE/core flexibility and postural stability/strengthening; balance training/dynamic gait activities    PT Home Exercise Plan  02/22/20 - Hooklying SKTC stretch, KTOS stretch, LTR, sitting clam shell with red TB, sitting DF; 03/06/20 - scapular retraction; 03/14/20 - standing retraction + chin tucks    Consulted and Agree with Plan of Care  Patient       Patient will benefit from skilled therapeutic intervention in order to improve the following deficits and impairments:  Abnormal gait, Cardiopulmonary status limiting activity, Decreased activity tolerance, Decreased balance, Decreased coordination, Decreased endurance, Decreased knowledge of precautions, Decreased knowledge of use of DME, Decreased mobility, Decreased range of motion, Decreased safety awareness, Decreased strength, Difficulty walking, Increased edema, Increased muscle spasms, Impaired perceived functional ability, Impaired flexibility, Improper body mechanics, Postural dysfunction, Pain  Visit Diagnosis: Unsteadiness on feet  Other abnormalities of gait and mobility  Pain in left hip  Acute left-sided low back pain  without sciatica  Abnormal posture     Problem List Patient Active Problem List   Diagnosis Date Noted  . Increased ammonia level 03/05/2020  . Anemia 03/05/2020  . Debility 03/05/2020  . Depression with anxiety 03/05/2020  . Palliative care by specialist   . Goals of care, counseling/discussion   . DNR (do not resuscitate)   . Hepatic encephalopathy ([place]) 02/25/2020  . Neck pain 02/10/2020  . Anorexia 02/10/2020  . AKI (acute kidney injury) ([place])   . Cellulitis of right lower extremity   . Acute exacerbation of CHF (congestive heart failure) ([place]) 01/30/2020  . CHF (congestive heart failure) ([place]) 01/20/2020  . Abdominal pain 08/23/2019  . Grief 08/23/2019  . Acute on chronic diastolic heart failure ([place]) 05/16/2019  . Chronic diastolic CHF (congestive heart failure) ([place]) 05/16/2019  . Calf pain 04/03/2019  . History of colonic polyps   . Polyp of cecum   . Headache 02/21/2019  . Rectal bleeding 02/13/2019  . COPD (chronic obstructive pulmonary disease) ([place]) 02/13/2019  . Acute on chronic right heart failure ([place]) 02/13/2019  . Hematochezia   . Ascites   . Dark stools   . Alcoholism ([place])   . Hyperglycemia 05/04/2018  . Seizure disorder ([place]) 02/01/2018  . Pedal edema 10/01/2017  . RUQ pain 05/06/2017  . Decreased range of motion of left elbow 04/15/2017  . Hyperlipidemia 03/17/2017  . Elbow pain, left 03/17/2017  . Right rib fracture   . Syncope 07/19/2016  . Thrombocytopenia ([place]) 06/29/2016  . Hoarseness, chronic 06/29/2016  . Preventative health care 06/29/2016  . Chest pain   . Low back pain 12/09/2015  . Cirrhosis with alcoholism ([place])  08/08/2015  . Pancytopenia ([place]) 06/10/2015  . Dermatitis 06/10/2015  . OSA (obstructive sleep apnea) 02/13/2015  . Gout 07/30/2014  . Diarrhea 09/04/2013  . Stage 4 chronic kidney disease ([place]) 07/18/2013  . Right heart failure due to pulmonary hypertension ([place]) 06/06/2013  . Hypothyroidism 01/25/2013  . PAH  (pulmonary arterial hypertension) with portal hypertension ([place]) 01/21/2013  . Allergic rhinitis 12/21/2012  . Secondary pulmonary hypertension 12/09/2012  . Hepatic cirrhosis ([place]) 11/26/2012  . GERD (gastroesophageal reflux disease) 09/05/2011  . Hypokalemia 08/29/2011  . Colon polyps 03/26/2011  . Insomnia 03/26/2011  . Hypertension 03/22/2011  . Alcohol dependence ([place]) 03/22/2011    Percival Spanish, PT, MPT 03/28/2020, 1:43 PM  [facility] [address]  [place], [place], [address] Phone: [phone]   Fax:  [phone]  Name: Darren Mueller MRN: [id number] Date of Birth: April 18, 1951

## 2020-03-28 NOTE — Telephone Encounter (Signed)
Patient wanted to know if prozac was sent in and that he needed lactulose sent in.  Advised that both meds was sent in and had refills on them and to call pharmacy.

## 2020-04-03 ENCOUNTER — Telehealth: Payer: Self-pay | Admitting: Oncology

## 2020-04-03 ENCOUNTER — Encounter: Payer: Self-pay | Admitting: Physical Therapy

## 2020-04-03 ENCOUNTER — Ambulatory Visit: Payer: Medicare Other | Attending: Family Medicine | Admitting: Physical Therapy

## 2020-04-03 ENCOUNTER — Other Ambulatory Visit: Payer: Self-pay

## 2020-04-03 DIAGNOSIS — R293 Abnormal posture: Secondary | ICD-10-CM | POA: Insufficient documentation

## 2020-04-03 DIAGNOSIS — R2689 Other abnormalities of gait and mobility: Secondary | ICD-10-CM | POA: Diagnosis present

## 2020-04-03 DIAGNOSIS — M545 Low back pain, unspecified: Secondary | ICD-10-CM

## 2020-04-03 DIAGNOSIS — M25552 Pain in left hip: Secondary | ICD-10-CM | POA: Insufficient documentation

## 2020-04-03 DIAGNOSIS — R2681 Unsteadiness on feet: Secondary | ICD-10-CM | POA: Diagnosis not present

## 2020-04-03 HISTORY — PX: EYE SURGERY: SHX253

## 2020-04-03 MED FILL — FOLIC ACID 1 MG TABS: 1 | 30 days supply | Qty: 30 | Fill #2

## 2020-04-03 NOTE — Therapy (Addendum)
[facility] [address]  [place], [place], [address] Phone: [phone]   Fax:  [phone]  Physical Therapy Treatment  Patient Details  Name: Darren Mueller MRN: [id number] Date of Birth: Jan 02, 1951 Referring Provider (PT): Mosie Lukes, MD   Encounter Date: 04/03/2020  PT End of Session - 04/03/20 1104    Visit Number  9    Number of Visits  16    Date for PT Re-Evaluation  04/16/20    Authorization Type  Medicare & T[employer] for Life    Progress Note Due on Visit  16   MD progress note completed on 7th visit - 03/19/20   PT Start Time  1104   PT Stop Time  1146   PT Time Calculation (min)  42 min   Activity Tolerance  Patient tolerated treatment well    Behavior During Therapy  [facility] for tasks assessed/performed       Past Medical History:  Diagnosis Date  . Alcohol dependence in remission ([place]) 03/22/2011   In remission   . Alcoholic cirrhosis of liver with ascites ([place]) 2014  . Anginal pain ([place])    with SOB admitted 04/20/2019  . Cardiomegaly 02/2019   Noted on CXR  . CHF (congestive heart failure) ([place])   . CKD (chronic kidney disease), stage III 07/18/2013  . COPD (chronic obstructive pulmonary disease) ([place])   . Coronary artery disease   . GERD (gastroesophageal reflux disease)   . Gout   . Hearing loss   . Hypertension   . Hypertriglyceridemia 03/17/2017  . PAH (pulmonary artery hypertension) ([facility])    RV failure  . Pancytopenia ([place])   . Pleural effusion 2014   Noted on CT:   Moderately large simple layering right pleural effusion   . Pulmonary nodule 2014   Noted on CT: An 8 mm pulmonary nodule in the periphery of the left lower lobe  . Rectal bleeding   . Seizures ([place])   . Sleep apnea    does not wear CPAP  . Splenomegaly 12/2015   Noted on CT  . Thoracic aorta atherosclerosis ([place]) 12/2017  . Thrombocytopenia ([place]) 06/29/2016  . Unspecified hypothyroidism 01/25/2013  . Vertigo      Past Surgical History:  Procedure Laterality Date  . CARDIAC CATHETERIZATION N/A 12/21/2015   Procedure: Right Heart Cath;  Surgeon: Jolaine Artist, MD;  Location: [facility];  Service: Cardiovascular;  Laterality: N/A;  . CARDIAC CATHETERIZATION N/A 04/15/2016   Procedure: Right/Left Heart Cath and Coronary Angiography;  Surgeon: Jolaine Artist, MD;  Location: [facility];  Service: Cardiovascular;  Laterality: N/A;  . COLONOSCOPY N/A 12/08/2013   Procedure: COLONOSCOPY;  Surgeon: Milus Banister, MD;  Location: [facility];  Service: Endoscopy;  Laterality: N/A;  . COLONOSCOPY WITH PROPOFOL N/A 03/24/2019   Procedure: COLONOSCOPY WITH PROPOFOL;  Surgeon: Milus Banister, MD;  Location: [facility];  Service: Endoscopy;  Laterality: N/A;  Draw CBC in preop  . COLONOSCOPY WITH PROPOFOL N/A 04/28/2019   Procedure: COLONOSCOPY WITH PROPOFOL;  Surgeon: Milus Banister, MD;  [facility];  Service: Endoscopy;  Laterality: N/A;  . ESOPHAGOGASTRODUODENOSCOPY (EGD) WITH PROPOFOL N/A 03/27/2016   Procedure: ESOPHAGOGASTRODUODENOSCOPY (EGD) WITH PROPOFOL;  Surgeon: Milus Banister, MD;  Location: [facility];  Service: Endoscopy;  Laterality: N/A;  . ESOPHAGOGASTRODUODENOSCOPY (EGD) WITH PROPOFOL N/A 02/14/2019   Procedure: ESOPHAGOGASTRODUODENOSCOPY (EGD) WITH PROPOFOL;  Surgeon: Jerene Bears, MD;  Location: [facility];  Service: Gastroenterology;  Laterality: N/A;  . EYE SURGERY Bilateral 03/15/2019   lasix  . GAS/FLUID EXCHANGE Right 09/22/2019   Procedure: GAS/FLUID EXCHANGE (C3F8) RIGHT EYE;  Surgeon: Sherlynn Stalls, MD;  Location: [facility];  Service: Ophthalmology;  Laterality: Right;  . HEMOSTASIS CLIP PLACEMENT  04/28/2019   Procedure: HEMOSTASIS CLIP PLACEMENT;  Surgeon: Milus Banister, MD;  [facility];  Service: Endoscopy;;  . LOOP RECORDER INSERTION N/A 01/01/2017   Procedure: Loop Recorder Insertion;  Surgeon: Thompson Grayer, MD;  Location: [facility];  Service: Cardiovascular;  Laterality: N/A;  . PARS PLANA VITRECTOMY Right 09/22/2019   Procedure: PARS PLANA VITRECTOMY WITH 25 GAUGE RIGHT EYE;  Surgeon: Sherlynn Stalls, MD;  Location: [facility];  Service: Ophthalmology;  Laterality: Right;  . PHOTOCOAGULATION WITH LASER Right 09/22/2019   Procedure: PHOTOCOAGULATION WITH LASER RIGHT EYE;  Surgeon: Sherlynn Stalls, MD;  Location: [facility];  Service: Ophthalmology;  Laterality: Right;  . POLYPECTOMY  04/28/2019   Procedure: POLYPECTOMY;  Surgeon: Milus Banister, MD;  Location: [facility];  Service: Endoscopy;;  . RIGHT HEART CATHETERIZATION Right 06/06/2014   Procedure: RIGHT HEART CATH;  Surgeon: Jolaine Artist, MD;  Location: [facility];  Service: Cardiovascular;  Laterality: Right;  . UVULOPALATOPHARYNGOPLASTY  1999    There were no vitals filed for this visit.  Subjective Assessment - 04/03/20 1108    Subjective  Pt reports he will know the final verdict on his med changes when he sees his cardiologist. He denies any issues with HEP.    Pertinent History  PMH significant for CHF, CAD, COPD, CKD, HTN, aortopulmonary hypertension, heart murmur, HLD, cellulitis, vertigo, seizures, gout, ETOH abuse with ETOH cirrhosis and small esophageal varices    Currently in Pain?  No/denies         [facility] PT Assessment - 04/03/20 1104      Assessment   Medical Diagnosis  Low back and L hip pain, Debility, Unsteady gait    Referring Provider (PT)  Mosie Lukes, MD    Next MD Visit  04/06/20 with Mackie Pai, PA-C      Ambulation/Gait   Assistive device  None    Gait Pattern  Step-through pattern;Trunk flexed   mild trunk flexion with head down   Ambulation Surface  Level;Indoor    Gait velocity  2.96 ft/sec    Stairs  Yes    Stairs Assistance  5: Supervision    Stair Management Technique  One rail Right;Forwards;Alternating pattern;Step to pattern   reciprocal ascent, step-to descent   Number of Stairs  14    Height of  Stairs  7      Standardized Balance Assessment   10 Meter Walk  11.09 sec      Functional Gait  Assessment   Gait assessed   Yes    Gait Level Surface  Walks 20 ft, slow speed, abnormal gait pattern, evidence for imbalance or deviates 10-15 in outside of the 12 in walkway width. Requires more than 7 sec to ambulate 20 ft.    Change in Gait Speed  Able to change speed, demonstrates mild gait deviations, deviates 6-10 in outside of the 12 in walkway width, or no gait deviations, unable to achieve a major change in velocity, or uses a change in velocity, or uses an assistive device.    Gait with Horizontal Head Turns  Performs head turns smoothly with slight change in gait velocity (eg, minor disruption to  smooth gait path), deviates 6-10 in outside 12 in walkway width, or uses an assistive device.    Gait with Vertical Head Turns  Performs task with slight change in gait velocity (eg, minor disruption to smooth gait path), deviates 6 - 10 in outside 12 in walkway width or uses assistive device    Gait and Pivot Turn  Pivot turns safely in greater than 3 sec and stops with no loss of balance, or pivot turns safely within 3 sec and stops with mild imbalance, requires small steps to catch balance.    Step Over Obstacle  Is able to step over one shoe box (4.5 in total height) but must slow down and adjust steps to clear box safely. May require verbal cueing.    Gait with Narrow Base of Support  Ambulates 4-7 steps.    Gait with Eyes Closed  Walks 20 ft, slow speed, abnormal gait pattern, evidence for imbalance, deviates 10-15 in outside 12 in walkway width. Requires more than 9 sec to ambulate 20 ft.    Ambulating Backwards  Walks 20 ft, slow speed, abnormal gait pattern, evidence for imbalance, deviates 10-15 in outside 12 in walkway width.    Steps  Two feet to a stair, must use rail.    Total Score  14    FGA comment:  < 19 = high risk fall                    [facility] Adult PT  Treatment/Exercise - 04/03/20 1104      High Level Balance   High Level Balance Activities  Backward walking;Tandem walking;Side stepping;Braiding    High Level Balance Comments  pt unable to fully cross legs with attempt at braiding      Lumbar Exercises: Aerobic   Nustep  L4 x 6 min (UE/LE)   seat #14     Lumbar Exercises: Standing   Scapular Retraction  Both;10 reps;Strengthening   3 sets   Scapular Retraction Limitations  postural correction with back against pool noodle on wall pressing shoulders and head back toward noodle/wall while retracting scapulae into pool noodle on wall - 2nd & 3rd sets adding red TB B shoulder horizontal ABD & ER          Balance Exercises - 04/03/20 1104      Balance Exercises: Standing   Stepping Strategy  Posterior;UE support;10 reps   alt backward step (TRX) - VC to inc step length & avoid ER   Tandem Gait  Forward;Retro;Upper extremity support;4 reps   10 ft along counter   Retro Gait  Upper extremity support;4 reps   10 ft along counter - cues for increased stride length         PT Short Term Goals - 03/19/20 1117      PT SHORT TERM GOAL #1   Title  Patient will be independent with initial HEP    Status  Achieved   03/12/20     PT SHORT TERM GOAL #2   Title  Patient to report L hip/low back pain reduction in frequency and intensity by >/= 25-50%    Status  Achieved   03/12/20     PT SHORT TERM GOAL #3   Title  Patient will increase gait speed to >/= 2.0 ft/sec with improved posture and gait pattern to increase safety with community ambulation    Status  Achieved   03/19/20: gait speed = 3.27 ft/sec     PT SHORT TERM  GOAL #4   Title  Patient will demonstrate decreased TUG time to </= 20 sec to decrease risk for falls with transitional mobility    Status  Achieved   03/19/20: TUG = 14.06 sec       PT Long Term Goals - 03/19/20 1127      PT LONG TERM GOAL #1   Title  Patient will be independent with ongoing/advanced HEP     Status  On-going   03/19/20: limited updates to HEP as pt has had limited opportunity to work on initial HEP due to MD appts and medical issues   Target Date  04/16/20      PT LONG TERM GOAL #2   Title  Patient to report L hip/low back pain reduction in frequency and intensity by >/= 50-75%    Status  Achieved   03/12/20     PT LONG TERM GOAL #3   Title  Patient will demonstrate improved B LE strength to >/= 4 to 4+/5 for improved stability and ease of mobility    Status  Partially Met    Target Date  04/16/20      PT LONG TERM GOAL #4   Title  Patient will increase gait speed to >/= 2.62 ft/sec with good posture and normal gait pattern to increase safety with community ambulation    Status  Partially Met   03/19/20: Met for gait speed (3.27 ft/sec) but still with tendency for head down posture   Target Date  04/16/20      PT LONG TERM GOAL #5   Title  Patient will demonstrate decreased TUG time to </= 13.5 sec to decrease risk for falls with transitional mobility    Baseline  TUG = 27.62 sec    Status  On-going   03/19/20: TIG = 14.06 sec   Target Date  04/16/20      PT LONG TERM GOAL #6   Title  Patient will improve Berg score to >/= 51/56 to improve safety stability with ADLs in standing and reduce risk for falls    Baseline  Baseline: Berg = 38/56; Initial LTG: Patient will improve Berg score to >/= 47/56 to improve safety stability with ADLs in standing and reduce risk for falls (met 03/19/20 & revised)    Status  Revised   03/19/20: Merrilee Jansky = 47/56 - goal revised to reflect potential for further improvement   Target Date  04/16/20            Plan - 04/03/20 1111    Clinical Impression Statement  FGA completed today with score of 14/30 indicating a high risk for falls, with areas of greatest difficulty noted with walking backwards, walking with eyes closed and stepping over obstacles. Initiated therapeutic activities to address these deficits as well as dynamic gait activities  to promote improved gait stability and stepping strategies. Pt requiring UE support for most activities and unable to successfully cross legs with braiding attempt. As back pain remains well controlled, will continue focus on overall postural and LE strengthening, balance and gait stability, and increasing activity tolerance.    Personal Factors and Comorbidities  Comorbidity 3+;Past/Current Experience;Time since onset of injury/illness/exacerbation;Age;Fitness    Comorbidities  PMH significant for CHF, CAD, COPD, CKD, HTN, aortopulmonary hypertension, heart murmur, HLD, cellulitis, vertigo, seizures, gout, ETOH abuse with ETOH cirrhosis and small esophageal varices    Examination-Activity Limitations  Bend;Lift;Locomotion Level;Sit;Squat;Stand    Examination-Participation Restrictions  Cleaning;Community Activity;Laundry;Meal Prep    Rehab Potential  Good  PT Frequency  2x / week    PT Duration  8 weeks    PT Treatment/Interventions  ADLs/Self Care Home Management;Cryotherapy;Electrical Stimulation;Moist Heat;DME Instruction;Gait training;Stair training;Functional mobility training;Therapeutic activities;Therapeutic exercise;Balance training;Neuromuscular re-education;Patient/family education;Manual techniques;Passive range of motion;Dry needling;Taping;Joint Manipulations    PT Next Visit Plan  Monitor VS; LE/core flexibility and postural stability/strengthening; balance training/dynamic gait activities    PT Home Exercise Plan  02/22/20 - Hooklying SKTC stretch, KTOS stretch, LTR, sitting clam shell with red TB, sitting DF; 03/06/20 - scapular retraction; 03/14/20 - standing retraction + chin tucks    Consulted and Agree with Plan of Care  Patient       Patient will benefit from skilled therapeutic intervention in order to improve the following deficits and impairments:  Abnormal gait, Cardiopulmonary status limiting activity, Decreased activity tolerance, Decreased balance, Decreased coordination,  Decreased endurance, Decreased knowledge of precautions, Decreased knowledge of use of DME, Decreased mobility, Decreased range of motion, Decreased safety awareness, Decreased strength, Difficulty walking, Increased edema, Increased muscle spasms, Impaired perceived functional ability, Impaired flexibility, Improper body mechanics, Postural dysfunction, Pain  Visit Diagnosis: Unsteadiness on feet  Other abnormalities of gait and mobility  Pain in left hip  Acute left-sided low back pain without sciatica  Abnormal posture     Problem List Patient Active Problem List   Diagnosis Date Noted  . Increased ammonia level 03/05/2020  . Anemia 03/05/2020  . Debility 03/05/2020  . Depression with anxiety 03/05/2020  . Palliative care by specialist   . Goals of care, counseling/discussion   . DNR (do not resuscitate)   . Hepatic encephalopathy ([place]) 02/25/2020  . Neck pain 02/10/2020  . Anorexia 02/10/2020  . AKI (acute kidney injury) ([place])   . Cellulitis of right lower extremity   . Acute exacerbation of CHF (congestive heart failure) ([place]) 01/30/2020  . CHF (congestive heart failure) ([place]) 01/20/2020  . Abdominal pain 08/23/2019  . Grief 08/23/2019  . Acute on chronic diastolic heart failure ([place]) 05/16/2019  . Chronic diastolic CHF (congestive heart failure) ([place]) 05/16/2019  . Calf pain 04/03/2019  . History of colonic polyps   . Polyp of cecum   . Headache 02/21/2019  . Rectal bleeding 02/13/2019  . COPD (chronic obstructive pulmonary disease) ([place]) 02/13/2019  . Acute on chronic right heart failure ([place]) 02/13/2019  . Hematochezia   . Ascites   . Dark stools   . Alcoholism ([place])   . Hyperglycemia 05/04/2018  . Seizure disorder ([place]) 02/01/2018  . Pedal edema 10/01/2017  . RUQ pain 05/06/2017  . Decreased range of motion of left elbow 04/15/2017  . Hyperlipidemia 03/17/2017  . Elbow pain, left 03/17/2017  . Right rib fracture   . Syncope 07/19/2016  .  Thrombocytopenia ([place]) 06/29/2016  . Hoarseness, chronic 06/29/2016  . Preventative health care 06/29/2016  . Chest pain   . Low back pain 12/09/2015  . Cirrhosis with alcoholism ([place]) 08/08/2015  . Pancytopenia ([place]) 06/10/2015  . Dermatitis 06/10/2015  . OSA (obstructive sleep apnea) 02/13/2015  . Gout 07/30/2014  . Diarrhea 09/04/2013  . Stage 4 chronic kidney disease ([place]) 07/18/2013  . Right heart failure due to pulmonary hypertension ([place]) 06/06/2013  . Hypothyroidism 01/25/2013  . PAH (pulmonary arterial hypertension) with portal hypertension ([place]) 01/21/2013  . Allergic rhinitis 12/21/2012  . Secondary pulmonary hypertension 12/09/2012  . Hepatic cirrhosis ([place]) 11/26/2012  . GERD (gastroesophageal reflux disease) 09/05/2011  . Hypokalemia 08/29/2011  . Colon polyps 03/26/2011  . Insomnia 03/26/2011  . Hypertension 03/22/2011  .  Alcohol dependence ([place]) 03/22/2011    Percival Spanish, PT, MPT 04/03/2020, 12:37 PM  [facility] [address]  [place], [place], [address] Phone: [phone]   Fax:  [phone]  Name: PAVLOS YON MRN: [id number] Date of Birth: 01-14-1951

## 2020-04-03 NOTE — Telephone Encounter (Signed)
Scheduled appt per 6/1 sch message - unable to reach pt - left message for with appt date and time

## 2020-04-04 ENCOUNTER — Inpatient Hospital Stay: Payer: Medicare Other | Attending: Oncology | Admitting: Oncology

## 2020-04-04 ENCOUNTER — Other Ambulatory Visit: Payer: Self-pay

## 2020-04-04 ENCOUNTER — Encounter: Payer: Self-pay | Admitting: Gastroenterology

## 2020-04-04 ENCOUNTER — Ambulatory Visit (HOSPITAL_COMMUNITY)
Admission: RE | Admit: 2020-04-04 | Discharge: 2020-04-04 | Disposition: A | Discharge status: Home / Self Care | Payer: Medicare Other | Source: Ambulatory Visit | Attending: Cardiology | Admitting: Cardiology

## 2020-04-04 VITALS — BP 100/60 | HR 60 | Temp 97.7°F | Resp 18 | Ht 73.0 in | Wt 190.2 lb

## 2020-04-04 DIAGNOSIS — I2721 Secondary pulmonary arterial hypertension: Secondary | ICD-10-CM | POA: Insufficient documentation

## 2020-04-04 DIAGNOSIS — K7030 Alcoholic cirrhosis of liver without ascites: Secondary | ICD-10-CM | POA: Diagnosis not present

## 2020-04-04 DIAGNOSIS — K7460 Unspecified cirrhosis of liver: Secondary | ICD-10-CM | POA: Diagnosis not present

## 2020-04-04 DIAGNOSIS — D696 Thrombocytopenia, unspecified: Secondary | ICD-10-CM | POA: Diagnosis not present

## 2020-04-04 DIAGNOSIS — K766 Portal hypertension: Secondary | ICD-10-CM | POA: Diagnosis not present

## 2020-04-04 LAB — CUP PACEART REMOTE DEVICE CHECK
Date Time Interrogation Session: 20210602010549
Implantable Pulse Generator Implant Date: 20180301

## 2020-04-04 LAB — CBC
HCT: 28.9 % — ABNORMAL LOW (ref 39.0–52.0)
Hemoglobin: 9.1 g/dL — ABNORMAL LOW (ref 13.0–17.0)
MCH: 33.3 pg (ref 26.0–34.0)
MCHC: 31.5 g/dL (ref 30.0–36.0)
MCV: 105.9 fL — ABNORMAL HIGH (ref 80.0–100.0)
Platelets: 25 10*3/uL — CL (ref 150–400)
RBC: 2.73 MIL/uL — ABNORMAL LOW (ref 4.22–5.81)
RDW: 18.1 % — ABNORMAL HIGH (ref 11.5–15.5)
WBC: 2.3 10*3/uL — ABNORMAL LOW (ref 4.0–10.5)
nRBC: 0 % (ref 0.0–0.2)

## 2020-04-04 LAB — BASIC METABOLIC PANEL
Anion gap: 12 (ref 5–15)
BUN: 46 mg/dL — ABNORMAL HIGH (ref 8–23)
CO2: 18 mmol/L — ABNORMAL LOW (ref 22–32)
Calcium: 8.4 mg/dL — ABNORMAL LOW (ref 8.9–10.3)
Chloride: 110 mmol/L (ref 98–111)
Creatinine, Ser: 2.76 mg/dL — ABNORMAL HIGH (ref 0.61–1.24)
GFR calc Af Amer: 26 mL/min — ABNORMAL LOW (ref >60)
GFR calc non Af Amer: 22 mL/min — ABNORMAL LOW (ref >60)
Glucose, Bld: 125 mg/dL — ABNORMAL HIGH (ref 70–99)
Potassium: 3.1 mmol/L — ABNORMAL LOW (ref 3.5–5.1)
Sodium: 140 mmol/L (ref 135–145)

## 2020-04-04 LAB — AMMONIA: Ammonia: 69 umol/L — ABNORMAL HIGH (ref 9–35)

## 2020-04-04 NOTE — Progress Notes (Signed)
Hematology and Oncology Follow Up Visit  Darren Mueller [id number] 11/08/1950 69 y.o. 04/04/2020 10:24 AM Mosie Lukes, MDBlyth, Bonnita Levan, MD   Principle Diagnosis: 69 year old man with thrombocytopenia diagnosed in 2012.  This is attributed to splenomegaly and sequestration from cirrhosis of the liver.     Current therapy: Active surveillance.  Interim History:  [name] is here for a follow-up visit.  Since the last visit, he was hospitalized in April 2021 for hepatic encephalopathy which she has recovered without any major complications.  Since his discharge, he reports no major changes in his health.  He is ambulating without any major difficulties and drives short distances.  He denies any hematochezia, melena or hemoptysis.  He denies any altered mental status or confusion.    Medications: Reviewed without changes. Current Outpatient Medications  Medication Sig Dispense Refill  . acetaminophen (TYLENOL) 500 MG tablet Take 1 tablet (500 mg total) by mouth every 6 (six) hours as needed (for pain.). 30 tablet 0  . ALPRAZolam (XANAX) 0.25 MG tablet Take 1 tablet (0.25 mg total) by mouth 2 (two) times daily as needed for anxiety or sleep. (Patient not taking: Reported on 03/26/2020) 20 tablet 0  . Blood Pressure Monitoring (BLOOD PRESSURE CUFF) MISC Use as directed. Check blood pressure bid prn Dx:I10 1 each 0  . Ferrous Fumarate (HEMOCYTE) 324 (106 Fe) MG TABS tablet Take 1 tablet (106 mg of iron total) by mouth daily. 30 tablet 5  . FLUoxetine (PROZAC) 20 MG tablet Take 0.5-1 tablets (10-20 mg total) by mouth daily. (Patient not taking: Reported on 03/26/2020) 30 tablet 3  . folic acid (FOLVITE) 1 MG tablet Take 1 mg by mouth daily.    Marland Kitchen lactulose (CHRONULAC) 10 GM/15ML solution Take 30 mLs (20 g total) by mouth 2 (two) times daily. 300 mL 5  . macitentan (OPSUMIT) 10 MG tablet Take 1 tablet (10 mg total) by mouth daily. 90 tablet 3  . omeprazole (PRILOSEC) 40 MG capsule TAKE 1  CAPSULE BY MOUTH ONCE DAILY 90 capsule 0  . Potassium Chloride ER 20 MEQ TBCR Take 40 mEq by mouth 2 (two) times daily.    . rosuvastatin (CRESTOR) 5 MG tablet TAKE 1 TABLET DAILY 90 tablet 3  . Selexipag (UPTRAVI) 1000 MCG TABS Take 1,000 mcg by mouth 2 (two) times daily. 60 tablet 11  . tadalafil, PAH, (ADCIRCA) 20 MG tablet Take 2 tablets (40 mg total) by mouth daily. 180 tablet 1  . Thiamine HCl (THIAMINE PO) Take 1 tablet by mouth daily. vitamin B-1    . torsemide (DEMADEX) 20 MG tablet Take 3 tablets (60 mg total) by mouth 2 (two) times daily. 180 tablet 5   No current facility-administered medications for this visit.     Allergies:  Allergies  Allergen Reactions  . Aspirin Hypertension  . Nitroglycerin Other (See Comments)    Severe Headache      Physical Exam: Blood pressure 100/60, pulse 60, temperature 97.7 F (36.5 C), temperature source Temporal, resp. rate 18, height _0  (1.854 m), weight 190 lb 3.2 oz (86.3 kg), SpO2 98 %.   ECOG: 1    General appearance: Comfortable appearing without any discomfort Head: Normocephalic without any trauma Oropharynx: Mucous membranes are moist and pink without any thrush or ulcers. Eyes: Pupils are equal and round reactive to light. Lymph nodes: No cervical, supraclavicular, inguinal or axillary lymphadenopathy.   Heart:regular rate and rhythm.  S1 and S2 without leg edema. Lung: Clear without any  rhonchi or wheezes.   Abdomin: Soft, nontender, nondistended with good bowel sounds.  Shifting dullness and ascites noted. Musculoskeletal: No joint deformity or effusion.  Full range of motion noted. Neurological: No deficits noted on motor, sensory and deep tendon reflex exam. Skin: No petechial rash or dryness.  Appeared moist.       Lab Results: Lab Results  Component Value Date   WBC 2.3 Repeated and verified X2. (L) 03/22/2020   HGB 10.3 (L) 03/22/2020   HCT 31.3 (L) 03/22/2020   MCV 102.7 (H) 03/22/2020   PLT 28.0  Repeated and verified X2. (LL) 03/22/2020     Chemistry      Component Value Date/Time   NA 138 03/22/2020 1230   NA 140 06/29/2019 0924   NA 140 06/15/2015 1414   K 3.5 03/22/2020 1230   K 3.6 06/15/2015 1414   CL 110 03/22/2020 1230   CL 106 06/15/2015 1414   CO2 24 03/22/2020 1230   CO2 17 (L) 06/15/2015 1414   BUN 36 (H) 03/22/2020 1230   BUN 33 (H) 06/29/2019 0924   BUN 18 06/15/2015 1414   CREATININE 1.99 (H) 03/22/2020 1230   CREATININE 3.21 (HH) 12/12/2019 1059   CREATININE 1.63 (H) 09/17/2017 1614      Component Value Date/Time   CALCIUM 8.4 03/22/2020 1230   CALCIUM 9.5 06/15/2015 1414   ALKPHOS 263 (H) 03/22/2020 1230   ALKPHOS 145 (H) 06/15/2015 1414   AST 56 (H) 03/22/2020 1230   AST 34 12/12/2019 1059   ALT 31 03/22/2020 1230   ALT 8 12/12/2019 1059   ALT 35 06/15/2015 1414   BILITOT 1.5 (H) 03/22/2020 1230   BILITOT 1.8 (H) 12/12/2019 1059       Impression and Plan:  69 year old man with:  1. 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  His platelet count has fluctuated around 30,000 since that time.  Laboratory data in the last 6 months reviewed and showed overall stable platelet count between 28 and 31,000 without any active bleeding.  The differential diagnosis was reviewed again and management options were reiterated.  Given his overall stable counts without any active bleeding, no intervention is needed.  His risk of bleeding is high because of cirrhosis of the liver is required at this time.  Other causes for his thrombocytopenia such as DIC, HUS, TTP among others are considered less likely.     2.  Leukocytopenia: Related to hepatic sequestration.  White cell count is close to baseline.  3.  Cirrhosis of the liver: He status post recent hospitalization for encephalopathy currently recovered.  I recommended follow-up with GI moving forward.  4. Follow-up: In 6 months for repeat  evaluation.  30  minutes were dedicated to this visit. The time was spent on reviewing laboratory data, discussing treatment options, discussing differential diagnosis and answering questions regarding future plan.     Zola Button, MD 6/2/202110:24 AM

## 2020-04-05 ENCOUNTER — Ambulatory Visit: Payer: Medicare Other

## 2020-04-05 ENCOUNTER — Telehealth: Payer: Self-pay | Admitting: Oncology

## 2020-04-05 ENCOUNTER — Ambulatory Visit (INDEPENDENT_AMBULATORY_CARE_PROVIDER_SITE_OTHER): Payer: Medicare Other | Admitting: *Deleted

## 2020-04-05 ENCOUNTER — Telehealth: Payer: Self-pay

## 2020-04-05 VITALS — BP 106/66 | HR 104

## 2020-04-05 DIAGNOSIS — R55 Syncope and collapse: Secondary | ICD-10-CM

## 2020-04-05 DIAGNOSIS — M545 Low back pain, unspecified: Secondary | ICD-10-CM

## 2020-04-05 DIAGNOSIS — R2681 Unsteadiness on feet: Secondary | ICD-10-CM

## 2020-04-05 DIAGNOSIS — M25552 Pain in left hip: Secondary | ICD-10-CM

## 2020-04-05 DIAGNOSIS — R2689 Other abnormalities of gait and mobility: Secondary | ICD-10-CM

## 2020-04-05 DIAGNOSIS — R293 Abnormal posture: Secondary | ICD-10-CM

## 2020-04-05 NOTE — Therapy (Signed)
[facility] [address]  [place], [place], [address] Phone: [phone]   Fax:  [phone]  Physical Therapy Treatment  Patient Details  Name: Darren Mueller MRN: [id number] Date of Birth: 01-21-1951 Referring Provider (PT): Mosie Lukes, MD   Encounter Date: 04/05/2020  PT End of Session - 04/05/20 1110    Visit Number  10    Number of Visits  16    Date for PT Re-Evaluation  04/16/20    Authorization Type  Medicare & T[employer] for Life    Progress Note Due on Visit  16   MD progress note completed on 7th visit - 03/19/20   PT Start Time  1105   Pt. arrived late to session   PT Stop Time  1155    PT Time Calculation (min)  50 min    Activity Tolerance  Patient tolerated treatment well    Behavior During Therapy  [facility] for tasks assessed/performed       Past Medical History:  Diagnosis Date   Alcohol dependence in remission ([place]) 03/22/2011   In remission    Alcoholic cirrhosis of liver with ascites ([place]) 2014   Anginal pain ([place])    with SOB admitted 04/20/2019   Cardiomegaly 02/2019   Noted on CXR   CHF (congestive heart failure) ([place])    CKD (chronic kidney disease), stage III 07/18/2013   COPD (chronic obstructive pulmonary disease) ([place])    Coronary artery disease    GERD (gastroesophageal reflux disease)    Gout    Hearing loss    Hypertension    Hypertriglyceridemia 03/17/2017   PAH (pulmonary artery hypertension) ([place])    RV failure   Pancytopenia ([place])    Pleural effusion 2014   Noted on CT:   Moderately large simple layering right pleural effusion    Pulmonary nodule 2014   Noted on CT: An 8 mm pulmonary nodule in the periphery of the left lower lobe   Rectal bleeding    Seizures (H[facility])    Sleep apnea    does not wear CPAP   Splenomegaly 12/2015   Noted on CT   Thoracic aorta atherosclerosis ([place]) 12/2017   Thrombocytopenia ([place]) 06/29/2016   Unspecified  hypothyroidism 01/25/2013   Vertigo     Past Surgical History:  Procedure Laterality Date   CARDIAC CATHETERIZATION N/A 12/21/2015   Procedure: Right Heart Cath;  Surgeon: Jolaine Artist, MD;  Location: [facility];  Service: Cardiovascular;  Laterality: N/A;   CARDIAC CATHETERIZATION N/A 04/15/2016   Procedure: Right/Left Heart Cath and Coronary Angiography;  Surgeon: Jolaine Artist, MD;  Location: [facility];  Service: Cardiovascular;  Laterality: N/A;   COLONOSCOPY N/A 12/08/2013   Procedure: COLONOSCOPY;  Surgeon: Milus Banister, MD;  Location: [facility];  Service: Endoscopy;  Laterality: N/A;   COLONOSCOPY WITH PROPOFOL N/A 03/24/2019   Procedure: COLONOSCOPY WITH PROPOFOL;  Surgeon: Milus Banister, MD;  Location: [facility];  Service: Endoscopy;  Laterality: N/A;  Draw CBC in preop   COLONOSCOPY WITH PROPOFOL N/A 04/28/2019   Procedure: COLONOSCOPY WITH PROPOFOL;  Surgeon: Milus Banister, MD;  [facility]: [facility];  Service: Endoscopy;  Laterality: N/A;   ESOPHAGOGASTRODUODENOSCOPY (EGD) WITH PROPOFOL N/A 03/27/2016   Procedure: ESOPHAGOGASTRODUODENOSCOPY (EGD) WITH PROPOFOL;  Surgeon: Milus Banister, MD;  Location: [facility];  Service: Endoscopy;  Laterality: N/A;   ESOPHAGOGASTRODUODENOSCOPY (EGD) WITH PROPOFOL N/A 02/14/2019   Procedure: ESOPHAGOGASTRODUODENOSCOPY (EGD)  WITH PROPOFOL;  Surgeon: Jerene Bears, MD;  Location: [facility];  Service: Gastroenterology;  Laterality: N/A;   EYE SURGERY Bilateral 03/15/2019   lasix   GAS/FLUID EXCHANGE Right 09/22/2019   Procedure: GAS/FLUID EXCHANGE (C3F8) RIGHT EYE;  Surgeon: Sherlynn Stalls, MD;  Location: [facility];  Service: Ophthalmology;  Laterality: Right;   HEMOSTASIS CLIP PLACEMENT  04/28/2019   Procedure: HEMOSTASIS CLIP PLACEMENT;  Surgeon: Milus Banister, MD;  [facility]: [facility];  Service: Endoscopy;;   LOOP RECORDER INSERTION N/A 01/01/2017   Procedure: Loop Recorder Insertion;   Surgeon: Thompson Grayer, MD;  Location: [facility];  Service: Cardiovascular;  Laterality: N/A;   PARS PLANA VITRECTOMY Right 09/22/2019   Procedure: PARS PLANA VITRECTOMY WITH 25 GAUGE RIGHT EYE;  Surgeon: Sherlynn Stalls, MD;  Location: [facility];  Service: Ophthalmology;  Laterality: Right;   PHOTOCOAGULATION WITH LASER Right 09/22/2019   Procedure: PHOTOCOAGULATION WITH LASER RIGHT EYE;  Surgeon: Sherlynn Stalls, MD;  Location: [facility];  Service: Ophthalmology;  Laterality: Right;   POLYPECTOMY  04/28/2019   Procedure: POLYPECTOMY;  Surgeon: Milus Banister, MD;  [facility];  Service: Endoscopy;;   RIGHT HEART CATHETERIZATION Right 06/06/2014   Procedure: RIGHT HEART CATH;  Surgeon: Jolaine Artist, MD;  Location: [facility];  Service: Cardiovascular;  Laterality: Right;   UVULOPALATOPHARYNGOPLASTY  1999    Vitals:   04/05/20 1110 04/05/20 1126  BP: 106/66   Pulse: 98 (!) 104  SpO2: 99% 95%    Subjective Assessment - 04/05/20 1202    Subjective  Pt. noting he is low on energy today.    Pertinent History  PMH significant for CHF, CAD, COPD, CKD, HTN, aortopulmonary hypertension, heart murmur, HLD, cellulitis, vertigo, seizures, gout, ETOH abuse with ETOH cirrhosis and small esophageal varices    Currently in Pain?  No/denies    Pain Score  0-No pain    Multiple Pain Sites  No         [facility] PT Assessment - 04/05/20 0001      Strength   Strength Assessment Site  Hip;Knee;Ankle    Right/Left Hip  Right;Left    Right Hip Flexion  4/5    Right Hip Extension  4/5    Right Hip External Rotation   4+/5    Right Hip Internal Rotation  4+/5    Right Hip ABduction  4+/5    Right Hip ADduction  4/5    Left Hip Flexion  4+/5    Left Hip Extension  4/5    Left Hip External Rotation  4+/5    Left Hip Internal Rotation  4+/5    Left Hip ABduction  4/5    Left Hip ADduction  4/5    Right/Left Knee  Right;Left    Right Knee Flexion  5/5    Right Knee Extension  5/5     Left Knee Flexion  5/5    Left Knee Extension  5/5    Right/Left Ankle  Right;Left    Right Ankle Dorsiflexion  4+/5    Right Ankle Plantar Flexion  4/5    Left Ankle Dorsiflexion  4+/5    Left Ankle Plantar Flexion  4/5      Berg Balance Test   Sit to Stand  Able to stand without using hands and stabilize independently    Standing Unsupported  Able to stand safely 2 minutes    Sitting with Back Unsupported but Feet Supported on Floor or Stool  Able  to sit safely and securely 2 minutes    Stand to Sit  Sits safely with minimal use of hands    Transfers  Able to transfer safely, minor use of hands    Standing Unsupported with Eyes Closed  Able to stand 10 seconds safely    Standing Unsupported with Feet Together  Able to place feet together independently and stand 1 minute safely    From Standing, Reach Forward with Outstretched Arm  Can reach confidently >25 cm (10")    From Standing Position, Pick up Object from Floor  Able to pick up shoe safely and easily    From Standing Position, Turn to Look Behind Over each Shoulder  Looks behind one side only/other side shows less weight shift    Turn 360 Degrees  Able to turn 360 degrees safely but slowly    Standing Unsupported, Alternately Place Feet on Step/Stool  Able to stand independently and complete 8 steps >20 seconds    Standing Unsupported, One Foot in Front  Able to place foot tandem independently and hold 30 seconds    Standing on One Leg  Tries to lift leg/unable to hold 3 seconds but remains standing independently    Total Score  49      Timed Up and Go Test   Normal TUG (seconds)  15.75                    [facility] Adult PT Treatment/Exercise - 04/05/20 0001      Ambulation/Gait   Ambulation/Gait  Yes    Ambulation/Gait Assistance  5: Supervision    Ambulation/Gait Assistance Details  Cues for upright posture, increased step-length for improved gait speed     Ambulation Distance (Feet)  90 Feet    Assistive  device  None    Gait Pattern  Step-through pattern;Trunk flexed    Ambulation Surface  Level;Indoor    Gait velocity  2.50 ft/sec      Lumbar Exercises: Aerobic   Nustep  L4 x 4 min (UE/LE)               PT Short Term Goals - 03/19/20 1117      PT SHORT TERM GOAL #1   Title  Patient will be independent with initial HEP    Status  Achieved   03/12/20     PT SHORT TERM GOAL #2   Title  Patient to report L hip/low back pain reduction in frequency and intensity by >/= 25-50%    Status  Achieved   03/12/20     PT SHORT TERM GOAL #3   Title  Patient will increase gait speed to >/= 2.0 ft/sec with improved posture and gait pattern to increase safety with community ambulation    Status  Achieved   03/19/20: gait speed = 3.27 ft/sec     PT SHORT TERM GOAL #4   Title  Patient will demonstrate decreased TUG time to </= 20 sec to decrease risk for falls with transitional mobility    Status  Achieved   03/19/20: TUG = 14.06 sec       PT Long Term Goals - 04/05/20 1128      PT LONG TERM GOAL #1   Title  Patient will be independent with ongoing/advanced HEP    Status  Partially Met   04/320: noting partial adherence to HEP "every other day" due to limitation with MD app and nursing coming by home     PT LONG  TERM GOAL #2   Title  Patient to report L hip/low back pain reduction in frequency and intensity by >/= 50-75%    Status  Achieved   03/12/20     PT LONG TERM GOAL #3   Title  Patient will demonstrate improved B LE strength to >/= 4 to 4+/5 for improved stability and ease of mobility    Status  Achieved   04/05/20     PT LONG TERM GOAL #4   Title  Patient will increase gait speed to >/= 2.62 ft/sec with good posture and normal gait pattern to increase safety with community ambulation    Status  Partially Met   04/05/20: somewhat slower gait speed without AD today at 2.50 ft/sec however pt. noting he is "low on energy"     PT LONG TERM GOAL #5   Title  Patient will  demonstrate decreased TUG time to </= 13.5 sec to decrease risk for falls with transitional mobility    Baseline  TUG = 27.62 sec    Status  On-going   04/05/20: TUG = 15.75 sec     PT LONG TERM GOAL #6   Title  Patient will improve Berg score to >/= 51/56 to improve safety stability with ADLs in standing and reduce risk for falls    Baseline  Baseline: Berg = 38/56; Initial LTG: Patient will improve Berg score to >/= 47/56 to improve safety stability with ADLs in standing and reduce risk for falls (met 03/19/20 & revised)    Status  On-going   04/05/20: Berg = 49/56           Plan - 04/05/20 1112    Clinical Impression Statement  Pt. has made good progress with physical therapy.  Feels he has made the most progress in PT with his balance and walking speed however would like to continue focusing on his balance and improving his stride length for walking.  Able to partially achieve LTG #1 as pt. noting he is now more consistent with HEP performance however still somewhat limited by busy schedule with MD appointments.  Has previously met LTG #2 as he notes 90% improvement in frequency and intensity of LBP/hip pain.  Able to fully achieve LTG #3 demonstrating B LE strength of at least 4/5 strength with MMT.  Demonstrating somewhat of a decline with walking speed without AD today at 2.89f/sec from previously tested two weeks ago at 2.96 ft/sec.  LTG #4 partially achieved.  Pt. does note that he is low on energy today and attributes this to not being able to eat significant breakfast due to medication restrictions.  TUG time with slight decline to 15.75 sec likely in part due to previously listed reasoning.  Pt. with improved gait speed with gait training for increased step length today and will plan to continue further gait training for improved stride length and upright posture in future visits.  LTG #5 ongoing.  Pt. demonstrating progress with static standing balance as BERG Balance score at 49/56  today improved from 47/56 when last addressed a few weeks ago.  Progressing toward LTG ongoing.  Pt. will likely continue progressing toward remaining LTGs as he has shown good improvement with therapy in gait speed, LE strength, and static balance since beginning of POC.  Will continue to benefit from further skilled therapy to improve LE functional strength, gait mechanics and standing/dynamic balance for improved safety.    Comorbidities  PMH significant for CHF, CAD, COPD, CKD, HTN, aortopulmonary hypertension,  heart murmur, HLD, cellulitis, vertigo, seizures, gout, ETOH abuse with ETOH cirrhosis and small esophageal varices    Rehab Potential  Good    PT Frequency  2x / week    PT Treatment/Interventions  ADLs/Self Care Home Management;Cryotherapy;Electrical Stimulation;Moist Heat;DME Instruction;Gait training;Stair training;Functional mobility training;Therapeutic activities;Therapeutic exercise;Balance training;Neuromuscular re-education;Patient/family education;Manual techniques;Passive range of motion;Dry needling;Taping;Joint Manipulations    PT Next Visit Plan  Monitor VS; LE/core flexibility and postural stability/strengthening; balance training/dynamic gait activities    PT Home Exercise Plan  02/22/20 - Hooklying SKTC stretch, KTOS stretch, LTR, sitting clam shell with red TB, sitting DF; 03/06/20 - scapular retraction; 03/14/20 - standing retraction + chin tucks    Consulted and Agree with Plan of Care  Patient       Patient will benefit from skilled therapeutic intervention in order to improve the following deficits and impairments:  Abnormal gait, Cardiopulmonary status limiting activity, Decreased activity tolerance, Decreased balance, Decreased coordination, Decreased endurance, Decreased knowledge of precautions, Decreased knowledge of use of DME, Decreased mobility, Decreased range of motion, Decreased safety awareness, Decreased strength, Difficulty walking, Increased edema, Increased  muscle spasms, Impaired perceived functional ability, Impaired flexibility, Improper body mechanics, Postural dysfunction, Pain  Visit Diagnosis: Unsteadiness on feet  Other abnormalities of gait and mobility  Pain in left hip  Acute left-sided low back pain without sciatica  Abnormal posture     Problem List Patient Active Problem List   Diagnosis Date Noted   Increased ammonia level 03/05/2020   Anemia 03/05/2020   Debility 03/05/2020   Depression with anxiety 03/05/2020   Palliative care by specialist    Goals of care, counseling/discussion    DNR (do not resuscitate)    Hepatic encephalopathy ([place]) 02/25/2020   Neck pain 02/10/2020   Anorexia 02/10/2020   AKI (acute kidney injury) ([place])    Cellulitis of right lower extremity    Acute exacerbation of CHF (congestive heart failure) ([place]) 01/30/2020   CHF (congestive heart failure) ([place]) 01/20/2020   Abdominal pain 08/23/2019   Grief 08/23/2019   Acute on chronic diastolic heart failure ([place]) 05/16/2019   Chronic diastolic CHF (congestive heart failure) ([place]) 05/16/2019   Calf pain 04/03/2019   History of colonic polyps    Polyp of cecum    Headache 02/21/2019   Rectal bleeding 02/13/2019   COPD (chronic obstructive pulmonary disease) ([place]) 02/13/2019   Acute on chronic right heart failure (HCC) 02/13/2019   Hematochezia    Ascites    Dark stools    Alcoholism ([facility])    Hyperglycemia 05/04/2018   Seizure disorder ([place]) 02/01/2018   Pedal edema 10/01/2017   RUQ pain 05/06/2017   Decreased range of motion of left elbow 04/15/2017   Hyperlipidemia 03/17/2017   Elbow pain, left 03/17/2017   Right rib fracture    Syncope 07/19/2016   Thrombocytopenia ([place]) 06/29/2016   Hoarseness, chronic 06/29/2016   Preventative health care 06/29/2016   Chest pain    Low back pain 12/09/2015   Cirrhosis with alcoholism ([place]) 08/08/2015   Pancytopenia ([place]) 06/10/2015    Dermatitis 06/10/2015   OSA (obstructive sleep apnea) 02/13/2015   Gout 07/30/2014   Diarrhea 09/04/2013   Stage 4 chronic kidney disease ([place]) 07/18/2013   Right heart failure due to pulmonary hypertension ([place]) 06/06/2013   Hypothyroidism 01/25/2013   PAH (pulmonary arterial hypertension) with portal hypertension ([place]) 01/21/2013   Allergic rhinitis 12/21/2012   Secondary pulmonary hypertension 12/09/2012   Hepatic cirrhosis ([place]) 11/26/2012   GERD (gastroesophageal reflux disease)  09/05/2011   Hypokalemia 08/29/2011   Colon polyps 03/26/2011   Insomnia 03/26/2011   Hypertension 03/22/2011   Alcohol dependence ([place]) 03/22/2011    Bess Harvest, PTA 04/05/20 12:28 PM   [facility] [address]  [place], [place], [address] Phone: [phone]   Fax:  [phone]  Name: Darren Mueller MRN: [id number] Date of Birth: Apr 10, 1951

## 2020-04-05 NOTE — Telephone Encounter (Signed)
Scheduled appt per 6/2 los.  A calendar will be mailed out

## 2020-04-05 NOTE — Telephone Encounter (Signed)
VM left for patient to schedule follow up visit with Palliative care.

## 2020-04-06 ENCOUNTER — Ambulatory Visit: Payer: Medicare Other | Admitting: Medical

## 2020-04-06 DIAGNOSIS — Z0289 Encounter for other administrative examinations: Secondary | ICD-10-CM

## 2020-04-09 ENCOUNTER — Other Ambulatory Visit: Payer: Self-pay | Admitting: Family Medicine

## 2020-04-09 ENCOUNTER — Telehealth: Payer: Self-pay

## 2020-04-09 ENCOUNTER — Ambulatory Visit: Payer: Medicare Other | Admitting: Physical Therapy

## 2020-04-09 NOTE — Telephone Encounter (Signed)
VM left for patient to schedule visit with Palliative care

## 2020-04-10 NOTE — Telephone Encounter (Signed)
Last written: 03/05/20 Last ov: 03/22/2020  Next ov: 05/10/2020 Contract: none UDS: none

## 2020-04-10 NOTE — Progress Notes (Signed)
[facility] Summary Report / Loop Recorder

## 2020-04-11 ENCOUNTER — Ambulatory Visit: Payer: Medicare Other | Admitting: Physical Therapy

## 2020-04-11 ENCOUNTER — Telehealth: Payer: Self-pay

## 2020-04-11 NOTE — Telephone Encounter (Signed)
Phone call placed to patient's daughter to offer to schedule follow up Palliative care visit. Unable to leave VM

## 2020-04-13 ENCOUNTER — Other Ambulatory Visit: Payer: Self-pay | Admitting: *Deleted

## 2020-04-13 NOTE — Patient Outreach (Signed)
[facility]) Care Management  04/13/2020  Darren Mueller 04-12-1951 [id number]   Telephone Assessment-Successful-HF  RN spoke with pt's daughter Darren Mueller today who provider an update on pt's ongoing management care. Reports pt is doing well with adherence taking his ongoing Lactulose and with attendance to all medical appointments pending CAD on 6/21. Pt taking all prescription medications as prescribed with no problems or issues reported. States pt is weighing daily with no swelling or related symptoms and continues to work with the PT (unsteady gait) with [facility] with a home visit today.   Discussed the current plan of care along with goals and interventions that were updated according to pt's progress. Will re-evaluate pt's ongoing adherence with managing his care next month and continue to encouraged adherence.   [facility] CM Care Plan Problem One     Most Recent Value  Care Plan Problem One Deficient Knowledge related to CHF unfamiliarity with information/education  Role Documenting the Problem One Care Management Telephonic Coordinator  Care Plan for Problem One Active  [facility] Long Term Goal  Pt/Caregiver will verbalize a plan of action in the YELLOW zone within the next 90 days.  [facility] Long Term Goal Start Date 02/09/20  Interventions for Problem One Long Term Goal Will continue to educate caregiver/pt on HF symptoms and what to do if acute.  Will continue to allow pt time to adhere to the HF action plan with the information provider to adhere with managing his HF. Will continue to offer any supportive education material /tools to assist with pt ongonig management of care.       [name], RN Care Management Coordinator [place] Office [phone]

## 2020-04-16 ENCOUNTER — Other Ambulatory Visit: Payer: Self-pay

## 2020-04-16 ENCOUNTER — Ambulatory Visit: Payer: Medicare Other | Admitting: Physical Therapy

## 2020-04-16 ENCOUNTER — Encounter: Payer: Self-pay | Admitting: Physical Therapy

## 2020-04-16 VITALS — BP 117/63 | HR 82

## 2020-04-16 DIAGNOSIS — M545 Low back pain, unspecified: Secondary | ICD-10-CM

## 2020-04-16 DIAGNOSIS — R293 Abnormal posture: Secondary | ICD-10-CM

## 2020-04-16 DIAGNOSIS — R2681 Unsteadiness on feet: Secondary | ICD-10-CM | POA: Diagnosis not present

## 2020-04-16 DIAGNOSIS — R2689 Other abnormalities of gait and mobility: Secondary | ICD-10-CM

## 2020-04-16 DIAGNOSIS — M25552 Pain in left hip: Secondary | ICD-10-CM

## 2020-04-16 NOTE — Therapy (Addendum)
[facility] [address]  [place], [place], [address] Phone: [phone]   Fax:  [phone]  Physical Therapy Treatment / Recert / Discharge Summary  Patient Details  Name: Darren Mueller MRN: [id number] Date of Birth: 03-Jul-1951 Referring Provider (PT): Mosie Lukes, MD   Encounter Date: 04/16/2020   PT End of Session - 04/16/20 1407    Visit Number 11    Number of Visits 23    Date for PT Re-Evaluation 05/28/20    Authorization Type Medicare & [employer]    Progress Note Due on Visit 16   MD progress note completed on 7th visit - 03/19/20   PT Start Time 1310    PT Stop Time 1355    PT Time Calculation (min) 45 min    Equipment Utilized During Treatment Gait belt    Activity Tolerance Patient tolerated treatment well    Behavior During Therapy WFL for tasks assessed/performed           Past Medical History:  Diagnosis Date  . Alcohol dependence in remission ([facility]) 03/22/2011   In remission   . Alcoholic cirrhosis of liver with ascites ([place]) 2014  . Anginal pain ([place])    with SOB admitted 04/20/2019  . Cardiomegaly 02/2019   Noted on CXR  . CHF (congestive heart failure) ([place])   . CKD (chronic kidney disease), stage III 07/18/2013  . COPD (chronic obstructive pulmonary disease) ([place])   . Coronary artery disease   . GERD (gastroesophageal reflux disease)   . Gout   . Hearing loss   . Hypertension   . Hypertriglyceridemia 03/17/2017  . PAH (pulmonary artery hypertension) ([facility])    RV failure  . Pancytopenia ([place])   . Pleural effusion 2014   Noted on CT:   Moderately large simple layering right pleural effusion   . Pulmonary nodule 2014   Noted on CT: An 8 mm pulmonary nodule in the periphery of the left lower lobe  . Rectal bleeding   . Seizures ([place])   . Sleep apnea    does not wear CPAP  . Splenomegaly 12/2015   Noted on CT  . Thoracic aorta atherosclerosis ([place]) 12/2017  .  Thrombocytopenia ([facility]) 06/29/2016  . Unspecified hypothyroidism 01/25/2013  . Vertigo     Past Surgical History:  Procedure Laterality Date  . CARDIAC CATHETERIZATION N/A 12/21/2015   Procedure: Right Heart Cath;  Surgeon: Jolaine Artist, MD;  Location: [facility];  Service: Cardiovascular;  Laterality: N/A;  . CARDIAC CATHETERIZATION N/A 04/15/2016   Procedure: Right/Left Heart Cath and Coronary Angiography;  Surgeon: Jolaine Artist, MD;  Location: [facility];  Service: Cardiovascular;  Laterality: N/A;  . COLONOSCOPY N/A 12/08/2013   Procedure: COLONOSCOPY;  Surgeon: Milus Banister, MD;  Location: [facility];  Service: Endoscopy;  Laterality: N/A;  . COLONOSCOPY WITH PROPOFOL N/A 03/24/2019   Procedure: COLONOSCOPY WITH PROPOFOL;  Surgeon: Milus Banister, MD;  Location: [facility];  Service: Endoscopy;  Laterality: N/A;  Draw CBC in preop  . COLONOSCOPY WITH PROPOFOL N/A 04/28/2019   Procedure: COLONOSCOPY WITH PROPOFOL;  Surgeon: Milus Banister, MD;  Location: [facility];  Service: Endoscopy;  Laterality: N/A;  . ESOPHAGOGASTRODUODENOSCOPY (EGD) WITH PROPOFOL N/A 03/27/2016   Procedure: ESOPHAGOGASTRODUODENOSCOPY (EGD) WITH PROPOFOL;  Surgeon: Milus Banister, MD;  Location: [facility];  Service: Endoscopy;  Laterality: N/A;  . ESOPHAGOGASTRODUODENOSCOPY (EGD) WITH PROPOFOL N/A 02/14/2019  Procedure: ESOPHAGOGASTRODUODENOSCOPY (EGD) WITH PROPOFOL;  Surgeon: Jerene Bears, MD;  Location: [name];  Service: Gastroenterology;  Laterality: N/A;  . EYE SURGERY Bilateral 03/15/2019   lasix  . GAS/FLUID EXCHANGE Right 09/22/2019   Procedure: GAS/FLUID EXCHANGE (C3F8) RIGHT EYE;  Surgeon: Sherlynn Stalls, MD;  Location: [facility];  Service: Ophthalmology;  Laterality: Right;  . HEMOSTASIS CLIP PLACEMENT  04/28/2019   Procedure: HEMOSTASIS CLIP PLACEMENT;  Surgeon: Milus Banister, MD;  Location: [facility];  Service: Endoscopy;;  . LOOP RECORDER INSERTION N/A  01/01/2017   Procedure: Loop Recorder Insertion;  Surgeon: Thompson Grayer, MD;  Location: [facility];  Service: Cardiovascular;  Laterality: N/A;  . PARS PLANA VITRECTOMY Right 09/22/2019   Procedure: PARS PLANA VITRECTOMY WITH 25 GAUGE RIGHT EYE;  Surgeon: Sherlynn Stalls, MD;  Location: [facility];  Service: Ophthalmology;  Laterality: Right;  . PHOTOCOAGULATION WITH LASER Right 09/22/2019   Procedure: PHOTOCOAGULATION WITH LASER RIGHT EYE;  Surgeon: Sherlynn Stalls, MD;  Location: [facility];  Service: Ophthalmology;  Laterality: Right;  . POLYPECTOMY  04/28/2019   Procedure: POLYPECTOMY;  Surgeon: Milus Banister, MD;  Location: [facility];  Service: Endoscopy;;  . RIGHT HEART CATHETERIZATION Right 06/06/2014   Procedure: RIGHT HEART CATH;  Surgeon: Jolaine Artist, MD;  Location: [facility];  Service: Cardiovascular;  Laterality: Right;  . UVULOPALATOPHARYNGOPLASTY  1999    Vitals:   04/16/20 1312  BP: 117/63  Pulse: 82  SpO2: 97%     Subjective Assessment - 04/16/20 1315    Subjective Patient notes that he has been taking a new Lactulose medication that "makes me poop." Denies recent falls. Notes that therapy "helps a lot" and can tell when he skips a week. Feels more mobile and agile. Would like to continue coming to PT.    Pertinent History PMH significant for CHF, CAD, COPD, CKD, HTN, aortopulmonary hypertension, heart murmur, HLD, cellulitis, vertigo, seizures, gout, ETOH abuse with ETOH cirrhosis and small esophageal varices    Currently in Pain? No/denies              [facility] PT Assessment - 04/16/20 0001      Assessment   Medical Diagnosis Low back and L hip pain, Debility, Unsteady gait    Referring Provider (PT) Mosie Lukes, MD      Standardized Balance Assessment   10 Meter Walk 13.27 sec      Berg Balance Test   Sit to Stand Able to stand without using hands and stabilize independently    Standing Unsupported Able to stand safely 2 minutes    Sitting with Back  Unsupported but Feet Supported on Floor or Stool Able to sit safely and securely 2 minutes    Stand to Sit Sits safely with minimal use of hands    Transfers Able to transfer safely, minor use of hands    Standing Unsupported with Eyes Closed Able to stand 10 seconds safely    Standing Unsupported with Feet Together Able to place feet together independently and stand 1 minute safely    From Standing, Reach Forward with Outstretched Arm Can reach forward >12 cm safely (5")    From Standing Position, Pick up Object from Floor Able to pick up shoe safely and easily    From Standing Position, Turn to Look Behind Over each Shoulder Looks behind from both sides and weight shifts well    Turn 360 Degrees Able to turn 360 degrees safely but slowly  Standing Unsupported, Alternately Place Feet on Step/Stool Able to stand independently and complete 8 steps >20 seconds    Standing Unsupported, One Foot in Front Able to place foot tandem independently and hold 30 seconds    Standing on One Leg Tries to lift leg/unable to hold 3 seconds but remains standing independently    Total Score 49      Timed Up and Go Test   TUG Normal TUG    Normal TUG (seconds) 17.49   14.42 sec on 2nd trial     Functional Gait  Assessment   Gait assessed  Yes                         [facility] Adult PT Treatment/Exercise - 04/16/20 0001      Neuro Re-ed    Neuro Re-ed Details  alt toe tap on cone with 1-2 fingers support on counter x20; R/L LE tandem balance without UE support 30" each      Lumbar Exercises: Aerobic   Nustep L4 x 4 min (UE/LE)                  PT Education - 04/16/20 1406    Education Details update to HEP; discussion on objective progress and remaining impairments; edu on importance of HEP compliance to avoid decline    Person(s) Educated Patient    Methods Explanation;Demonstration;Tactile cues;Verbal cues;Handout    Comprehension Verbalized understanding;Returned demonstration             PT Short Term Goals - 04/16/20 1320      PT SHORT TERM GOAL #1   Title Patient will be independent with initial HEP    Status Achieved   03/12/20     PT SHORT TERM GOAL #2   Title Patient to report L hip/low back pain reduction in frequency and intensity by >/= 25-50%    Status Achieved   03/12/20     PT SHORT TERM GOAL #3   Title Patient will increase gait speed to >/= 2.0 ft/sec with improved posture and gait pattern to increase safety with community ambulation    Status Achieved   03/19/20: gait speed = 3.27 ft/sec     PT SHORT TERM GOAL #4   Title Patient will demonstrate decreased TUG time to </= 20 sec to decrease risk for falls with transitional mobility    Status Achieved   03/19/20: TUG = 14.06 sec            PT Long Term Goals - 04/16/20 1321      PT LONG TERM GOAL #1   Title Patient will be independent with ongoing/advanced HEP    Time 6    Status Partially Met   04/16/20: noting limited adherence d/t new medication causing him to go to the restroom every 20 minutes   Target Date 05/28/20      PT LONG TERM GOAL #2   Title Patient to report L hip/low back pain reduction in frequency and intensity by >/= 50-75%    Status Achieved   03/12/20     PT LONG TERM GOAL #3   Title Patient will demonstrate improved B LE strength to >/= 4 to 4+/5 for improved stability and ease of mobility    Status Achieved   04/05/20     PT LONG TERM GOAL #4   Title Patient will increase gait speed to >/= 2.62 ft/sec with good posture and normal gait pattern to increase  safety with community ambulation    Status Partially Met   04/16/20: 2.47 ft/sec without AD   Target Date 05/28/20      PT LONG TERM GOAL #5   Title Patient will demonstrate decreased TUG time to </= 13.5 sec to decrease risk for falls with transitional mobility    Baseline TUG = 27.62 sec    Status On-going   04/16/20: TUG = 14.42 sec   Target Date 05/28/20      PT LONG TERM GOAL #6   Title Patient will  improve Berg score to >/= 51/56 to improve safety stability with ADLs in standing and reduce risk for falls    Baseline Baseline: Berg = 38/56; Initial LTG: Patient will improve Berg score to >/= 47/56 to improve safety stability with ADLs in standing and reduce risk for falls (met 03/19/20 & revised)    Status On-going   04/16/20: Merrilee Jansky = 49/56   Target Date 05/28/20                 Plan - 04/16/20 1634    Clinical Impression Statement Patient reporting that coming to therapy "helps a lot;" feels more mobile and agile since starting therapy and can tell when he skips a week. Patient would like to continue working with PT in order to improve balance and walking ability. Patient has demonstrated maintenance of Berg score since last session, nearly meeting his balance goal. Did demonstrate small decline in gait speed today. Patient admitting to limited adherence to HEP d/t having to take a new laxative medication which causes him to go to the restroom every 20 minutes. Spoke to patient about performing activities intermittently throughout the day in order to improve/maintain productivity and fitness. All other goals have been met at this time. Updated HEP with standing balance exercises at counter top for safety- advised patient that these exercises would be suitable to perform at the bathroom counter if necessary. Patient reported understanding. Would benefit from skilled PT services 1-2x/week for 6 weeks to address remaining goals and improve fitness.    Comorbidities PMH significant for CHF, CAD, COPD, CKD, HTN, aortopulmonary hypertension, heart murmur, HLD, cellulitis, vertigo, seizures, gout, ETOH abuse with ETOH cirrhosis and small esophageal varices    Rehab Potential Good    PT Frequency 2x / week   1-2x/week   PT Duration 6 weeks    PT Treatment/Interventions ADLs/Self Care Home Management;Cryotherapy;Electrical Stimulation;Moist Heat;DME Instruction;Gait training;Stair training;Functional  mobility training;Therapeutic activities;Therapeutic exercise;Balance training;Neuromuscular re-education;Patient/family education;Manual techniques;Passive range of motion;Dry needling;Taping;Joint Manipulations    PT Next Visit Plan Monitor VS; LE/core flexibility and postural stability/strengthening; balance training/dynamic gait activities    PT Home Exercise Plan 02/22/20 - Hooklying SKTC stretch, KTOS stretch, LTR, sitting clam shell with red TB, sitting DF; 03/06/20 - scapular retraction; 03/14/20 - standing retraction + chin tucks; 04/16/20 - tandem balance, alt toe tap on cone    Consulted and Agree with Plan of Care Patient           Patient will benefit from skilled therapeutic intervention in order to improve the following deficits and impairments:  Abnormal gait, Cardiopulmonary status limiting activity, Decreased activity tolerance, Decreased balance, Decreased coordination, Decreased endurance, Decreased knowledge of precautions, Decreased knowledge of use of DME, Decreased mobility, Decreased range of motion, Decreased safety awareness, Decreased strength, Difficulty walking, Increased edema, Increased muscle spasms, Impaired perceived functional ability, Impaired flexibility, Improper body mechanics, Postural dysfunction, Pain  Visit Diagnosis: Unsteadiness on feet  Other abnormalities of gait and  mobility  Pain in left hip  Acute left-sided low back pain without sciatica  Abnormal posture     Problem List Patient Active Problem List   Diagnosis Date Noted  . Increased ammonia level 03/05/2020  . Anemia 03/05/2020  . Debility 03/05/2020  . Depression with anxiety 03/05/2020  . Palliative care by specialist   . Goals of care, counseling/discussion   . DNR (do not resuscitate)   . Hepatic encephalopathy ([place]) 02/25/2020  . Neck pain 02/10/2020  . Anorexia 02/10/2020  . AKI (acute kidney injury) ([place])   . Cellulitis of right lower extremity   . Acute exacerbation of  CHF (congestive heart failure) ([place]) 01/30/2020  . CHF (congestive heart failure) ([place]) 01/20/2020  . Abdominal pain 08/23/2019  . Grief 08/23/2019  . Acute on chronic diastolic heart failure ([place]) 05/16/2019  . Chronic diastolic CHF (congestive heart failure) ([place]) 05/16/2019  . Calf pain 04/03/2019  . History of colonic polyps   . Polyp of cecum   . Headache 02/21/2019  . Rectal bleeding 02/13/2019  . COPD (chronic obstructive pulmonary disease) ([place]) 02/13/2019  . Acute on chronic right heart failure ([place]) 02/13/2019  . Hematochezia   . Ascites   . Dark stools   . Alcoholism ([place])   . Hyperglycemia 05/04/2018  . Seizure disorder ([place]) 02/01/2018  . Pedal edema 10/01/2017  . RUQ pain 05/06/2017  . Decreased range of motion of left elbow 04/15/2017  . Hyperlipidemia 03/17/2017  . Elbow pain, left 03/17/2017  . Right rib fracture   . Syncope 07/19/2016  . Thrombocytopenia ([place]) 06/29/2016  . Hoarseness, chronic 06/29/2016  . Preventative health care 06/29/2016  . Chest pain   . Low back pain 12/09/2015  . Cirrhosis with alcoholism ([place]) 08/08/2015  . Pancytopenia ([place]) 06/10/2015  . Dermatitis 06/10/2015  . OSA (obstructive sleep apnea) 02/13/2015  . Gout 07/30/2014  . Diarrhea 09/04/2013  . Stage 4 chronic kidney disease ([place]) 07/18/2013  . Right heart failure due to pulmonary hypertension ([place]) 06/06/2013  . Hypothyroidism 01/25/2013  . PAH (pulmonary arterial hypertension) with portal hypertension ([place]) 01/21/2013  . Allergic rhinitis 12/21/2012  . Secondary pulmonary hypertension 12/09/2012  . Hepatic cirrhosis ([place]) 11/26/2012  . GERD (gastroesophageal reflux disease) 09/05/2011  . Hypokalemia 08/29/2011  . Colon polyps 03/26/2011  . Insomnia 03/26/2011  . Hypertension 03/22/2011  . Alcohol dependence ([place]) 03/22/2011     Janene Harvey, PT, DPT 04/16/20 4:41 PM   [facility] [address]   [place], [place], [address] Phone: [phone]   Fax:  [phone]  Name: Darren Mueller MRN: [id number] Date of Birth: September 30, 1951   PHYSICAL THERAPY DISCHARGE SUMMARY  Visits from Start of Care: 11  Current functional level related to goals / functional outcomes:   Refer to above clinical impression for status as of last visit on 04/16/2020. Patient had to cancel all remaining appointments due medical issues related to a detached retina and has been unable to return   to PT in >30 days, therefore will proceed with discharge from PT for this episode.   Remaining deficits:   As above.    Education / Equipment:   HEP  Plan: Patient agrees to discharge.  Patient goals were partially met. Patient is being discharged due to a change in medical status.  ?????     Percival Spanish, PT, MPT 06/12/20, 9:29 AM  [facility] [address]  [place], [place], [address] Phone: [phone]   Fax:  [phone]

## 2020-04-19 ENCOUNTER — Telehealth (HOSPITAL_COMMUNITY): Payer: Self-pay | Admitting: *Deleted

## 2020-04-19 MED FILL — OFLOXACIN 0.3% EYE DROPS: 0.3 | 25 days supply | Qty: 5 | Fill #0

## 2020-04-19 MED FILL — PREDNISOLONE AC 1% EYE DROP: 1 | 50 days supply | Qty: 10 | Fill #0

## 2020-04-19 NOTE — Telephone Encounter (Signed)
Heather at [facility] called to inform Dr.Bensimhon pt will be under MAC anesthesia during procedure with Dr.Richards tomorrow at 1230pm. For any questions or concerns call [phone].   Routed to [place]

## 2020-04-19 NOTE — Telephone Encounter (Signed)
Would try to minimize anesthesia as much as possible give tenuous condition.

## 2020-04-20 DIAGNOSIS — H33011 Retinal detachment with single break, right eye: Secondary | ICD-10-CM | POA: Insufficient documentation

## 2020-04-23 ENCOUNTER — Other Ambulatory Visit (INDEPENDENT_AMBULATORY_CARE_PROVIDER_SITE_OTHER): Payer: Medicare Other

## 2020-04-23 ENCOUNTER — Other Ambulatory Visit: Payer: Self-pay

## 2020-04-23 DIAGNOSIS — K7030 Alcoholic cirrhosis of liver without ascites: Secondary | ICD-10-CM

## 2020-04-23 LAB — COMPREHENSIVE METABOLIC PANEL
ALT: 15 U/L (ref 0–53)
AST: 33 U/L (ref 0–37)
Albumin: 3.3 g/dL — ABNORMAL LOW (ref 3.5–5.2)
Alkaline Phosphatase: 189 U/L — ABNORMAL HIGH (ref 39–117)
BUN: 31 mg/dL — ABNORMAL HIGH (ref 6–23)
CO2: 18 mEq/L — ABNORMAL LOW (ref 19–32)
Calcium: 8.2 mg/dL — ABNORMAL LOW (ref 8.4–10.5)
Chloride: 110 mEq/L (ref 96–112)
Creatinine, Ser: 2.43 mg/dL — ABNORMAL HIGH (ref 0.40–1.50)
GFR: 32.06 mL/min — ABNORMAL LOW (ref >60.00)
Glucose, Bld: 95 mg/dL (ref 70–99)
Potassium: 3.8 mEq/L (ref 3.5–5.1)
Sodium: 137 mEq/L (ref 135–145)
Total Bilirubin: 1.4 mg/dL — ABNORMAL HIGH (ref 0.2–1.2)
Total Protein: 6.2 g/dL (ref 6.0–8.3)

## 2020-04-24 ENCOUNTER — Ambulatory Visit: Payer: Medicare Other | Admitting: Physical Therapy

## 2020-04-26 ENCOUNTER — Encounter (HOSPITAL_COMMUNITY): Payer: Self-pay

## 2020-04-26 ENCOUNTER — Other Ambulatory Visit: Payer: Self-pay

## 2020-04-26 ENCOUNTER — Ambulatory Visit (HOSPITAL_COMMUNITY)
Admission: RE | Admit: 2020-04-26 | Discharge: 2020-04-26 | Disposition: A | Discharge status: Home / Self Care | Payer: Medicare Other | Source: Ambulatory Visit | Attending: Cardiology | Admitting: Cardiology

## 2020-04-26 VITALS — BP 98/62 | HR 74 | Wt 184.0 lb

## 2020-04-26 DIAGNOSIS — I2721 Secondary pulmonary arterial hypertension: Secondary | ICD-10-CM | POA: Insufficient documentation

## 2020-04-26 DIAGNOSIS — I2510 Atherosclerotic heart disease of native coronary artery without angina pectoris: Secondary | ICD-10-CM | POA: Diagnosis not present

## 2020-04-26 DIAGNOSIS — Z79899 Other long term (current) drug therapy: Secondary | ICD-10-CM | POA: Insufficient documentation

## 2020-04-26 DIAGNOSIS — E781 Pure hyperglyceridemia: Secondary | ICD-10-CM | POA: Insufficient documentation

## 2020-04-26 DIAGNOSIS — R569 Unspecified convulsions: Secondary | ICD-10-CM | POA: Insufficient documentation

## 2020-04-26 DIAGNOSIS — I130 Hypertensive heart and chronic kidney disease with heart failure and stage 1 through stage 4 chronic kidney disease, or unspecified chronic kidney disease: Secondary | ICD-10-CM | POA: Diagnosis not present

## 2020-04-26 DIAGNOSIS — G4733 Obstructive sleep apnea (adult) (pediatric): Secondary | ICD-10-CM | POA: Diagnosis not present

## 2020-04-26 DIAGNOSIS — H3321 Serous retinal detachment, right eye: Secondary | ICD-10-CM | POA: Insufficient documentation

## 2020-04-26 DIAGNOSIS — K219 Gastro-esophageal reflux disease without esophagitis: Secondary | ICD-10-CM | POA: Insufficient documentation

## 2020-04-26 DIAGNOSIS — I50810 Right heart failure, unspecified: Secondary | ICD-10-CM

## 2020-04-26 DIAGNOSIS — Z87891 Personal history of nicotine dependence: Secondary | ICD-10-CM | POA: Insufficient documentation

## 2020-04-26 DIAGNOSIS — E039 Hypothyroidism, unspecified: Secondary | ICD-10-CM | POA: Insufficient documentation

## 2020-04-26 DIAGNOSIS — I5032 Chronic diastolic (congestive) heart failure: Secondary | ICD-10-CM | POA: Diagnosis present

## 2020-04-26 DIAGNOSIS — R55 Syncope and collapse: Secondary | ICD-10-CM | POA: Diagnosis not present

## 2020-04-26 DIAGNOSIS — J449 Chronic obstructive pulmonary disease, unspecified: Secondary | ICD-10-CM | POA: Diagnosis not present

## 2020-04-26 DIAGNOSIS — N1831 Chronic kidney disease, stage 3a: Secondary | ICD-10-CM | POA: Insufficient documentation

## 2020-04-26 DIAGNOSIS — Z8249 Family history of ischemic heart disease and other diseases of the circulatory system: Secondary | ICD-10-CM | POA: Diagnosis not present

## 2020-04-26 DIAGNOSIS — R519 Headache, unspecified: Secondary | ICD-10-CM | POA: Insufficient documentation

## 2020-04-26 DIAGNOSIS — D61818 Other pancytopenia: Secondary | ICD-10-CM | POA: Diagnosis not present

## 2020-04-26 NOTE — Patient Instructions (Signed)
It was great to see you today! No medication changes are needed at this time.  Your physician recommends that you schedule a follow-up appointment in: 3-4 months with Dr Haroldine Laws  Do the following things EVERYDAY: 1) Weigh yourself in the morning before breakfast. Write it down and keep it in a log. 2) Take your medicines as prescribed 3) Eat low salt foods--Limit salt (sodium) to 2000 mg per day.  4) Stay as active as you can everyday 5) Limit all fluids for the day to less than 2 liters   At the [facility], you and your health needs are our priority. As part of our continuing mission to provide you with exceptional heart care, we have created designated Provider Care Teams. These Care Teams include your primary Cardiologist (physician) and Advanced Practice Providers (APPs- Physician Assistants and Nurse Practitioners) who all work together to provide you with the care you need, when you need it.   You may see any of the following providers on your designated Care Team at your next follow up: Marland Kitchen Dr Glori Bickers . Dr Loralie Champagne . Darrick Grinder, NP . [name], PA . Audry Riles, PharmD   Please be sure to bring in all your medications bottles to every appointment.

## 2020-04-26 NOTE — Progress Notes (Signed)
PCP: Dr Charlett Blake Primary Cardiologist: Dr Haroldine Laws   HPI: Darren Mueller is a 69  year old with a history of COPD, PAH, RV failure, cirrhosis, and ETOH abuse.    Admitted in 6/17 with CP and SOB. Underwent R/L cath. Minimal CAD with moderate PAH and preserved cardiac output.   Admitted 06/2016 and 07/2016 with syncope. On September admission found to have elevated ETOH level as well as R 6th rib fracture. Wore 30 day monitor up until 08/28/16. No AF noted. No events.   Admitted February2018after syncopal event. LINQ placed.   Was admitted 4/20 with hematochezia and melena. His hemoglobin remained stable. No episodes of GI bleed after hospitalization. Underwent EGD by GI with finding of Grade 1 and a small esophageal varices, no evidence of recent bleeding,portal hypertensive gastropathy. Started on nadolol which made him very dizzy. He stopped this and feels a lot  Admitted 7/13-16/20 with volume overload. Diuresed 21 pounds. D/c weight 192.  Echo 05/17/19: LVEF >65% RV severely dilated with moderate RV dysfunction. Moderate TR RVSP 38mHG. While in hospital was pancytopenic. Possibility of BMBx discussed with Dr. SAlen Blewbut felt it was not necessary as it was felt he had splenic sequestration and counts were low but very stable.  On triple therapy for PDavenportUptravi Macitentan  Adcirca   Admitted in March with increased shortness of breath. Diuresed with IV lasix and later transitioned to torsemide 60 mg twice a day. Hospital course complicated by AKI. Discharged on 01/26/20 with weight 199 pounds.    [facility] and leg edema. Weight has gone  up 20 pounds since discharge.Says he has been taking all medications.  Admitted with cellulitis and A/C RV failure. Diuresed with IV lasix and transitioned to torsemide.  Discharge weight 184 pounds.   Readmitted 02/25/20 with AMS. Ammonia was high, 112. Improved with lactulose.    [facility] Red Clip  Readings from [facility]  02/23/20 37%, 02/24/20 33%, and 03/01/20 32%.   He presents to clinic today for f/u. Here w/ his his daughter. At last visit, his wt was up 9 lb, in the setting of dietary indiscretion w/ sodium. Torsemide was increased to 80 mg bid x 3 days along w/ KCl and he was instructed to return back to 60 mg of torsemide bid thereafter.   Today in f/u, his wt is down 8 lb. Feels ok. Just had eye surgery on 6/18 for rt sided retinal detachment. Gradually improving. Continues w/ chronic/stable NYHA Class II-III symptoms. No resting dyspnea. No orthopnea/PND. No LEE. Denies CP. Reports full compliance w/ daily wts and home meds. Continues to have frequent headaches. No spontaneous bleeding. Denies any recurrent syncope. Linq interrogation unremarkable. BP is soft, in the upper 977Asystolic, but c/w baseline. No dizziness. His PCP checked labs 2 days ago (personally reviewed) K 3.8, Scr 2.4 c/w baseline.    Studies: ECHO 12/14 EF 55-60% Peak PA pressure 35. Severe RV dysfunction  ECHO 7/15 EF 60% RV moderately to severely dilated. Moderate HK RVSP 586mHG  ECHO 6/16 EF 60% RV moderately to severely dilated. Severe HK RVSP ~65 mm HG. D-shaped septum Echo 2/17 LVEF 60-65% RV massively dilated. Flat septum. Severe HK. Moderate TR RVSP ~65. IVC small. No effusion  ECHO 01/01/2017 EF 50-55% Grade I DD. RV severely dilated. Peak PA pressure 62 mm hg Echo 9/19 with EF 60-65% RV markedly dilated and hypokinetic with D-shaped septum. RVSP 6843m Echo 01/2020 EF 60-65% Grade IIDD, RVSP 83  ROS: All systems negative except as listed in HPI, PMH and Problem List.  SH:  Social History   Socioeconomic History  . Marital status: Divorced    Spouse name: Not on file  . Number of children: 3  . Years of education: Not on file  . Highest education level: Not on file  Occupational History  . Occupation: Retired    Comment: Social research officer, government  Tobacco Use  . Smoking status: Former Smoker    Packs/day:  2.00    Years: 5.00    Pack years: 10.00    Types: Cigarettes    Quit date: 09/22/1979    Years since quitting: 40.6  . Smokeless tobacco: Never Used  Vaping Use  . Vaping Use: Never used  Substance and Sexual Activity  . Alcohol use: Yes    Alcohol/week: 6.0 standard drinks    Types: 6 Shots of liquor per week    Comment: quit drinking in 12/2019  . Drug use: No  . Sexual activity: Yes    Birth control/protection: Spermicide  Other Topics Concern  . Not on file  Social History Narrative   0 caffeine drinks daily    Social Determinants of Health   Financial Resource Strain: Low Risk   . Difficulty of Paying Living Expenses: Not hard at all  Food Insecurity: No Food Insecurity  . Worried About Charity fundraiser in the Last Year: Never true  . Ran Out of Food in the Last Year: Never true  Transportation Needs: No Transportation Needs  . Lack of Transportation (Medical): No  . Lack of Transportation (Non-Medical): No  Physical Activity:   . Days of Exercise per Week:   . Minutes of Exercise per Session:   Stress:   . Feeling of Stress :   Social Connections:   . Frequency of Communication with Friends and Family:   . Frequency of Social Gatherings with Friends and Family:   . Attends Religious Services:   . Active Member of Clubs or Organizations:   . Attends Archivist Meetings:   Marland Kitchen Marital Status:   Intimate Partner Violence:   . Fear of Current or Ex-Partner:   . Emotionally Abused:   Marland Kitchen Physically Abused:   . Sexually Abused:     FH:  Family History  Problem Relation Age of Onset  . Heart disease Mother   . Heart attack Mother   . Hypertension Mother   . Kidney failure Mother   . Liver disease Mother        stage 4 liver disease  . Prostate cancer Father   . Cancer Father        lung  . Hypertension Brother   . Gout Brother   . Alcoholism Maternal Grandfather   . Liver disease Maternal Grandfather   . Migraines Sister   . Hypertension  Brother   . Colon cancer Neg Hx     Past Medical History:  Diagnosis Date  . Alcohol dependence in remission ([place]) 03/22/2011   In remission   . Alcoholic cirrhosis of liver with ascites ([place]) 2014  . Anginal pain ([place])    with SOB admitted 04/20/2019  . Cardiomegaly 02/2019   Noted on CXR  . CHF (congestive heart failure) ([place])   . CKD (chronic kidney disease), stage III 07/18/2013  . COPD (chronic obstructive pulmonary disease) ([place])   . Coronary artery disease   . GERD (gastroesophageal reflux disease)   . Gout   . Hearing loss   .  Hypertension   . Hypertriglyceridemia 03/17/2017  . PAH (pulmonary artery hypertension) ([facility])    RV failure  . Pancytopenia ([place])   . Pleural effusion 2014   Noted on CT:   Moderately large simple layering right pleural effusion   . Pulmonary nodule 2014   Noted on CT: An 8 mm pulmonary nodule in the periphery of the left lower lobe  . Rectal bleeding   . Seizures ([place])   . Sleep apnea    does not wear CPAP  . Splenomegaly 12/2015   Noted on CT  . Thoracic aorta atherosclerosis ([place]) 12/2017  . Thrombocytopenia ([place]) 06/29/2016  . Unspecified hypothyroidism 01/25/2013  . Vertigo     Current Outpatient Medications  Medication Sig Dispense Refill  . acetaminophen (TYLENOL) 500 MG tablet Take 1 tablet (500 mg total) by mouth every 6 (six) hours as needed (for pain.). 30 tablet 0  . ALPRAZolam (XANAX) 0.25 MG tablet TAKE 1 TABLET (0.25 MG TOTAL) BY MOUTH 2 (TWO) TIMES DAILY AS NEEDED FOR ANXIETY OR SLEEP. 20 tablet 0  . Blood Pressure Monitoring (BLOOD PRESSURE CUFF) MISC Use as directed. Check blood pressure bid prn Dx:I10 1 each 0  . Ferrous Fumarate (HEMOCYTE) 324 (106 Fe) MG TABS tablet Take 1 tablet (106 mg of iron total) by mouth daily. 30 tablet 5  . folic acid (FOLVITE) 1 MG tablet Take 1 mg by mouth daily.    Marland Kitchen lactulose (CHRONULAC) 10 GM/15ML solution Take 30 mLs (20 g total) by mouth 2 (two) times daily. 300 mL 5  . macitentan (OPSUMIT)  10 MG tablet Take 1 tablet (10 mg total) by mouth daily. 90 tablet 3  . omeprazole (PRILOSEC) 40 MG capsule TAKE 1 CAPSULE BY MOUTH ONCE DAILY 90 capsule 0  . Potassium Chloride ER 20 MEQ TBCR Take 40 mEq by mouth 2 (two) times daily.    . rosuvastatin (CRESTOR) 5 MG tablet TAKE 1 TABLET DAILY 90 tablet 3  . Selexipag (UPTRAVI) 1000 MCG TABS Take 1,000 mcg by mouth 2 (two) times daily. 60 tablet 11  . tadalafil, PAH, (ADCIRCA) 20 MG tablet Take 2 tablets (40 mg total) by mouth daily. 180 tablet 1  . Thiamine HCl (THIAMINE PO) Take 1 tablet by mouth daily. vitamin B-1    . torsemide (DEMADEX) 20 MG tablet Take 3 tablets (60 mg total) by mouth 2 (two) times daily. 180 tablet 5  . FLUoxetine (PROZAC) 20 MG tablet Take 0.5-1 tablets (10-20 mg total) by mouth daily. (Patient not taking: Reported on 03/26/2020) 30 tablet 3   No current facility-administered medications for this encounter.    Vitals:   04/26/20 0848  BP: 98/62  Pulse: 74  SpO2: 98%  Weight: 83.5 kg (184 lb)   Wt Readings from Last 3 Encounters:  04/26/20 83.5 kg (184 lb)  04/04/20 86.3 kg (190 lb 3.2 oz)  03/26/20 87.3 kg (192 lb 6.4 oz)   PHYSICAL EXAM: General:  Well appearing elderly AAM. No respiratory difficulty HEENT: normal Neck: supple. JVD ~8 cm. Carotids 2+ bilat; no bruits. No lymphadenopathy or thyromegaly appreciated. Cor: PMI nondisplaced. Regular rate & rhythm. No rubs, gallops or murmurs. Lungs: clear Abdomen: soft, nontender, nondistended. No hepatosplenomegaly. No bruits or masses. Good bowel sounds. Extremities: no cyanosis, clubbing, rash, edema Neuro: alert & oriented x 3, cranial nerves grossly intact. moves all 4 extremities w/o difficulty. Affect pleasant.    ASSESSMENT & PLAN:  1. Chronic Diastolic Heart Failure  ECHO 01/2020 EF 60-65% Grade IIDD -  chronically NYHA III. Euvolemic on exam - Continue torsemide 60 mg bid. Discussed daily wts.  - BMP done at PCP office 2 days ago reviewed, SCr  /K stable.  - We discussed importance of sodium restriction and strict compliance w/ diuretics  2. PAH - ECHO 01/2020 EF 60-65% RVSP 83 Grade II DD - On triple therapy- selexipag 1000 mcg twice a day. Intolerant higher dose due to diarrhea and headaches . - Continue macitentan 10 daily and adcirca 40 mg daily.  3. CKD Stage IIIa  - I reviewed BMET from 04/24/20, SCr stable  4. Headaches  - HAs could definitely come from selexipag but would be unusual to get worse without changing dose. - he has chronic pancytopenia due to liver disease (followed by Dr. Alen Blew) - I offered referral to neurology but he declined - may be due to poor compliance w/ CPAP.   5.  Recurrent syncope/seizure - LINQ interrogations have shown no arrhythmias and thus most likely seizure - Continue Keppra. Follows with Dr. Delice Lesch  6. OSA:  - Has mild OSA with AHI 12 and desats down to 82%.  - Intolerant CPAP.  7. Pancytopenia:  - Followed by Dr. Alen Blew in Hematology - denies spontaneous bleeding    8. Elevated Ammonia - Continue daily lactulose.  - PCP following - He and his daughter both deny any recent confusion/ AMS   9. Rt Retinal Detachment - s/p endolaser vitrectomy 04/20/20    F/u in 2-3 months     Raudel Bazen PA-C 8:59 AM

## 2020-04-27 ENCOUNTER — Ambulatory Visit: Payer: Medicare Other | Admitting: Physical Therapy

## 2020-04-27 MED FILL — GENERLAC 10 GM/15 ML SOLN: 10 | 5 days supply | Qty: 300 | Fill #4

## 2020-05-01 ENCOUNTER — Ambulatory Visit: Payer: Medicare Other | Admitting: Physical Therapy

## 2020-05-02 ENCOUNTER — Other Ambulatory Visit: Payer: Medicare Other

## 2020-05-02 ENCOUNTER — Other Ambulatory Visit: Payer: Self-pay

## 2020-05-02 ENCOUNTER — Other Ambulatory Visit: Payer: Self-pay | Admitting: Family Medicine

## 2020-05-02 DIAGNOSIS — Z515 Encounter for palliative care: Secondary | ICD-10-CM

## 2020-05-02 NOTE — Progress Notes (Signed)
COMMUNITY PALLIATIVE CARE SW NOTE  PATIENT NAME: Darren Mueller DOB: 1950-12-12 MRN: [id number]  PRIMARY CARE PROVIDER: Mosie Lukes, MD  RESPONSIBLE PARTY:  Acct ID - Guarantor Home Phone Work Phone Relationship Acct Type  [id number] Darren Mueller, [id number]  Self P/F     [place], [place], [place] [address]     PLAN OF CARE and INTERVENTIONS:             1. GOALS OF CARE/ ADVANCE CARE PLANNING:Patient is a DNR.  SW discussed DNR and MOST form with patient and family. Patient does not have active DNR form  In the form but is interested in completing MOST form, SW left MOST form for patient and family to discuss in detail. Pateint goal is to get stronger and remain independent.  2. SOCIAL/EMOTIONAL/SPIRITUAL ASSESSMENT/ INTERVENTIONS:  SW and RN met with patient and patients son, Darren Mueller, in the home for scheduled visit. Patient shared that he recently had surgery on his R eye to have retinoid removed and currently cannot see out of the eye, he is scheduled to return in 3 weeks to have the same surgery on the L eye. Patient shared that he is not sleeping well at night due to worrying about his vision and taking lactulose, which keeps up to the restroom often. Patient shared that he takes zantac to assist with his lack of sleeping, SW and RN also recommended melatonin as well. Patient does not nap during the day. Pateint has been instructed to have his PT on hold until he is healed from his eye surgeries. Patient has not had any medication changes, nurses from [facility] does in home visits to set up AM/PM medications for patient. Patient states that he eats well, no loss in appetite. Patient did not have any concerns or needs of palliative care team at this time. Will continue to follow.  3. PATIENT/CAREGIVER EDUCATION/ COPING: patient was alert and oriented and fully engaged in conversation. Patient ale to voice needs. Patient feeling anxious about eye surgery and vision. SW provided  supportive and reflective listening, patient is taing low dose of zantac. Family is supportive and son lives with him and has a daughter who resides in [place], but has other local family and friends whom are very supportive. 4. PERSONAL EMERGENCY PLAN: Patient to call 9-1-1 if emergency. 5. COMMUNITY RESOURCES COORDINATION/ HEALTH CARE NAVIGATION: Family assist with care. Patient is a Industrial/product designer. Does not wich to have any medical services through the [place]. 6. FINANCIAL/LEGAL CONCERNS/INTERVENTIONS: None.     SOCIAL HX:  Social History   Tobacco Use  . Smoking status: Former Smoker    Packs/day: 2.00    Years: 5.00    Pack years: 10.00    Types: Cigarettes    Quit date: 09/22/1979    Years since quitting: 40.6  . Smokeless tobacco: Never Used  Substance Use Topics  . Alcohol use: Yes    Alcohol/week: 6.0 standard drinks    Types: 6 Shots of liquor per week    Comment: quit drinking in 12/2019    CODE STATUS: DNR ADVANCED DIRECTIVES: Y  MOST FORM COMPLETE: in process HOSPICE EDUCATION PROVIDED: N  PPS: Patient is ambulatory mostly without an assistive devis but uses a cane when needed, Patient is independent with all ADLS.   Time Spent: 40 mins      Darren Mueller, Darren Mueller

## 2020-05-02 NOTE — Progress Notes (Signed)
PATIENT NAME: Darren Mueller DOB: 1951-10-14 MRN: [id number]  PRIMARY CARE PROVIDER: Mosie Lukes, MD  RESPONSIBLE PARTY:  Acct ID - Guarantor Home Phone Work Phone Relationship Acct Type  [id number] [name], [name]* [phone]  Self P/F     [place], [place], [place] [address]    PLAN OF CARE and INTERVENTIONS:               1.  GOALS OF CARE/ ADVANCE CARE PLANNING:  Patient would like to get stronger and remain independent               2.  PATIENT/CAREGIVER EDUCATION:  Education on importance of weighing self daily, education on fall precautions, reviewed meds, support              3.  DISEASE STATUS: SW [place] and RN made scheduled palliative care home visit. Palliative care team met with patient and his son [name]. Patient walks into living room from den.   Patients gait is unsteady. Patient reports he saw eye MD this AM.   Patient had surgery on his right eye due to suffering a detached retina. Patient states he is scheduled to see eye MD again in 3 weeks. Patient complaining of having a headache and reports he took 1 tablet of Tylenol prior to palliative care teams arrival.  Patient states headache is subsiding. Patient denies having any other pain. Patient reports he did not weigh himself this AM due to his vision and difficulty seeing.  Patients weight at last MD visit was 190.3 pounds. Patient states he uses his cane and has not suffered any falls. Patient remains on lactulose 30 ml daily and reports he has at least 3 bowel movements everyday. Patient reports he is no longer receiving outpatient PT as MD did not want him to receive PT while his eye is healing. Patient has shortness of breath on exertion and informs palliative care team he paces himself. Patient reports his appetite is good and denies having any nausea or vomiting. Patients breath sounds are clear. Patient reports he has not been sleeping well and wakes after a couple of hours. Patient has taken melatonin in the  past without good results. Patient reports home health nurse made recommendation for him to try taking one tablet of Xanax prior to bedtime. Patient has trace edema in his lower extremities. Nurse  reviewed patient's medications with patient and son. SW discussed MOST form and DNR with patient. Family in agreement with SW  obtaining MOST form for the home with limited code. Patient and family remain in agreement with palliative care services. Patient and family encouraged to contact palliative care with questions or concerns.     HISTORY OF PRESENT ILLNESS: Patient ia a 69 year old male who resides in his home.  Patients son [name] resides in home with patient.     CODE STATUS: Limited Code  ADVANCED DIRECTIVES: Y MOST FORM: SW to obtain MOST FORM PPS: 60%   PHYSICAL EXAM:   VITALS: Today's Vitals   05/02/20 1345  BP: (!) 98/58  Pulse: 83  Resp: 18  Temp: 97.7 F (36.5 C)  TempSrc: Temporal  SpO2: 97%  Weight: 190 lb 4.8 oz (86.3 kg)  PainSc: 0-No pain    LUNGS: clear to auscultation  CARDIAC: Cor RRR  EXTREMITIES: Trace edema SKIN: Skin color, texture, turgor normal. No rashes or lesions  NEURO: positive for gait problems and weakness    Nilda Simmer, RN

## 2020-05-03 ENCOUNTER — Telehealth: Payer: Self-pay | Admitting: Family Medicine

## 2020-05-03 MED FILL — ALPRAZolam 0.25 MG TABS: 0.25 | 10 days supply | Qty: 20 | Fill #0

## 2020-05-03 NOTE — Telephone Encounter (Signed)
Caller:Les Call back phone number: [phone]  Patient states he would like a medication that would help me sleep. Patient states xanax don't help.   Pharmacy: [facility], [address]  [place], [place] [address]  Phone:  [phone] Fax:  [phone]

## 2020-05-03 NOTE — Telephone Encounter (Signed)
Please advise.

## 2020-05-04 ENCOUNTER — Ambulatory Visit: Payer: Medicare Other | Attending: Family Medicine | Admitting: Physical Therapy

## 2020-05-04 NOTE — Telephone Encounter (Signed)
Patient has only been taking one tablet.  He will try 2 to see if that will help and he will call back if that does not work.

## 2020-05-04 NOTE — Telephone Encounter (Signed)
So with his meds it is difficult to decide what to try next. First confirm he has not tried taking 2 Alprazolam together at night to help him sleep. If he has not he should try that. If he has give him permission to take 3. He is on a low dose so we have the ability to increase the dosing if that helps. If that does not work he should let us know next week.

## 2020-05-08 ENCOUNTER — Ambulatory Visit: Payer: Medicare Other | Admitting: Physical Therapy

## 2020-05-08 ENCOUNTER — Telehealth: Payer: Self-pay | Admitting: Physical Therapy

## 2020-05-08 ENCOUNTER — Ambulatory Visit (INDEPENDENT_AMBULATORY_CARE_PROVIDER_SITE_OTHER): Payer: Medicare Other | Admitting: *Deleted

## 2020-05-08 DIAGNOSIS — R55 Syncope and collapse: Secondary | ICD-10-CM | POA: Diagnosis not present

## 2020-05-08 LAB — CUP PACEART REMOTE DEVICE CHECK
Date Time Interrogation Session: 20210706013637
Implantable Pulse Generator Implant Date: 20180301

## 2020-05-08 NOTE — Telephone Encounter (Signed)
Spoke with patient's son, Jabes Primo, earlier this morning in response to a message he left inquiring about patient's ability to participate in PT with restrictions related to recent eye surgery. Based on son's recount of restrictions it was determined that PT could accommodate these restrictions and we would keep today's appointment as scheduled unless son called back to cancel.  Patient was a NS for 1:15 appointment, therefore called Leighton Parody regarding NS. [name] stated he father was having worsening issues with his vision and was waiting for a nurse visit to check out his eyes. Given ongoing issues with eyes, discussed placing patient on hold for PT until vision concerns resolved with patient's son in agreement - all upcoming appointments cancelled. Patient's son made aware that a new MD referral will be necessary if patient were to return to PT >30 days since last visit on 04/16/20 - informed patient's son that he could contact the MD for the referral or he would rather, we could help facilitate a new referral when the patient is ready to return to PT.   Percival Spanish, PT, MPT 05/08/20, 1:55 PM  [facility] [address]  [place], [place], [address] Phone: [phone]   Fax:  [phone]

## 2020-05-09 NOTE — Progress Notes (Signed)
[facility] Summary Report / Loop Recorder

## 2020-05-10 ENCOUNTER — Encounter (HOSPITAL_COMMUNITY): Payer: Medicare Other | Admitting: Internal Medicine

## 2020-05-10 ENCOUNTER — Ambulatory Visit: Payer: Medicare Other | Admitting: Family Medicine

## 2020-05-10 DIAGNOSIS — Z0289 Encounter for other administrative examinations: Secondary | ICD-10-CM

## 2020-05-11 ENCOUNTER — Ambulatory Visit: Payer: Medicare Other | Admitting: Physical Therapy

## 2020-05-14 ENCOUNTER — Other Ambulatory Visit: Payer: Self-pay | Admitting: *Deleted

## 2020-05-14 NOTE — Patient Outreach (Signed)
[facility]) Care Management  05/14/2020  Darren Mueller 1951-05-26 [id number]   Telephone Assessment-Unsuccessful  RN attempted outreach to the primary caregiver daughter [name]) however unsuccessful. RN able to leave a voice message.  Plan: Will return another outreach call over the next week for ongoing services.  [name], RN Care Management Coordinator [place] Office [phone]

## 2020-05-15 ENCOUNTER — Ambulatory Visit: Payer: Medicare Other | Admitting: Physical Therapy

## 2020-05-18 ENCOUNTER — Other Ambulatory Visit: Payer: Self-pay | Admitting: *Deleted

## 2020-05-18 ENCOUNTER — Ambulatory Visit: Payer: Medicare Other | Admitting: Physical Therapy

## 2020-05-18 NOTE — Patient Outreach (Signed)
[facility]) Care Management  05/18/2020  Darren Mueller Aug 05, 1951 [id number]  Telephone Assessment-HF  RN spoke with pt's daughter today Darren Mueller) who provided an update on pt's ongoing management of care. States pt is doing very well in managing his weight and HF. States pt is weighing daily and maintaining his weight with no presented symptoms. States pt taking all his medications including his Lactulose with no delays or needed refills. Pt has attended all medical appointments with no delays.Rn continue to reiterated on reminders for this caregiver to discuss with the pt on adherence with daily weights, low sodium dietary measures and attendance to all scheduled medical appointments. No interventions reported from MD follow up appointments as pt continue to manage his care with no acute issues presented. Will reiterate on the plan of care and offer any needed resources to continue to assist pt with managing his ongoing care.   Plan: Will update pt's provider on pt's disposition with [facility] and follow up next month on pt's ongoing management of care. Will at that time discuss possible transition over to a health coach if pt continues to do well in managing his ongoing care.  Goals Addressed            This Visit's Progress   . manage his HF       CARE PLAN ENTRY (see longtitudinal plan of care for additional care plan information)   Current Barriers:  Marland Kitchen Knowledge deficit related to basic heart failure pathophysiology and self care management  Case Manager Clinical Goal(s):  Marland Kitchen Over the next 45 days, patient will verbalize understanding of Heart Failure Action Plan and when to call doctor . Over the next 25 days, patient will weigh daily and record (notifying MD of 3 lb weight gain over night or 5 lb in a week)  Interventions:  . Basic overview and discussion of pathophysiology of Heart Failure reviewed  . Advised patient to weigh each morning after emptying  bladder . Discussed importance of daily weight and advised patient to weigh and record daily  Patient Self Care Activities:  . Takes Heart Failure Medications as prescribed . Weighs daily and record (notifying MD of 3 lb weight gain over night or 5 lb in a week) . Verbalizes understanding of and follows CHF Action Plan . Adheres to low sodium diet  Initial goal documentation        [name], RN Care Management Coordinator [place] Office [phone]

## 2020-05-21 ENCOUNTER — Telehealth: Payer: Self-pay

## 2020-05-21 NOTE — Telephone Encounter (Signed)
Palliaitve Care SW LVM for patient and paitnes son confirming scheudled home visit for Fri 06-01-2020 _0 

## 2020-05-23 DIAGNOSIS — H33052 Total retinal detachment, left eye: Secondary | ICD-10-CM | POA: Diagnosis not present

## 2020-05-23 DIAGNOSIS — H33021 Retinal detachment with multiple breaks, right eye: Secondary | ICD-10-CM | POA: Diagnosis not present

## 2020-05-24 ENCOUNTER — Telehealth: Payer: Self-pay

## 2020-05-24 ENCOUNTER — Other Ambulatory Visit: Payer: Self-pay | Admitting: Ophthalmology

## 2020-05-24 MED FILL — OFLOXACIN 0.3% EYE DROPS: 0.3 | 25 days supply | Qty: 5 | Fill #0

## 2020-05-24 NOTE — Telephone Encounter (Signed)
Carelink alert report received, battery at RRT.  Spoke with pt, updated carelink and paceart.  Return kit mailed for remote monitor box.  Pt does wish to have device removed.  Sending to scheduling for appt with Dr. Rayann Heman.

## 2020-05-29 ENCOUNTER — Other Ambulatory Visit (HOSPITAL_COMMUNITY)
Admission: RE | Admit: 2020-05-29 | Discharge: 2020-05-29 | Disposition: A | Discharge status: Home / Self Care | Payer: Medicare Other | Source: Ambulatory Visit | Attending: Ophthalmology | Admitting: Ophthalmology

## 2020-05-29 DIAGNOSIS — Z01812 Encounter for preprocedural laboratory examination: Secondary | ICD-10-CM | POA: Diagnosis not present

## 2020-05-29 DIAGNOSIS — Z20822 Contact with and (suspected) exposure to covid-19: Secondary | ICD-10-CM | POA: Insufficient documentation

## 2020-05-29 LAB — SARS CORONAVIRUS 2 (TAT 6-24 HRS): SARS Coronavirus 2: NEGATIVE

## 2020-05-30 ENCOUNTER — Telehealth: Payer: Self-pay

## 2020-05-30 ENCOUNTER — Encounter: Payer: Self-pay | Admitting: Internal Medicine

## 2020-05-30 ENCOUNTER — Encounter (HOSPITAL_COMMUNITY): Payer: Self-pay | Admitting: Ophthalmology

## 2020-05-30 ENCOUNTER — Ambulatory Visit: Payer: Medicare Other | Admitting: Gastroenterology

## 2020-05-30 NOTE — Telephone Encounter (Signed)
Palliative care SW called and LVM for patient and patints son [name] to schedule/confirm in home visit for July. Awaiting return call.

## 2020-05-30 NOTE — Anesthesia Preprocedure Evaluation (Addendum)
Anesthesia Evaluation  Patient identified by MRN, date of birth, ID band Patient awake    Reviewed: Allergy & Precautions, NPO status , Patient's Chart, lab work & pertinent test results  Airway Mallampati: II  TM Distance: >3 FB Neck ROM: Full    Dental  (+) Dental Advisory Given   Pulmonary sleep apnea , COPD, former smoker,    breath sounds clear to auscultation       Cardiovascular hypertension, Pt. on medications + angina + CAD and +CHF   Rhythm:Regular Rate:Normal     Neuro/Psych Seizures -,  Anxiety Depression    GI/Hepatic GERD  ,(+) Cirrhosis   ascites    ,   Endo/Other  Hypothyroidism   Renal/GU Renal disease     Musculoskeletal   Abdominal   Peds  Hematology  (+) anemia ,   Anesthesia Other Findings   Reproductive/Obstetrics                             Anesthesia Physical Anesthesia Plan  ASA: IV  Anesthesia Plan: MAC   Post-op Pain Management:    Induction: Intravenous  PONV Risk Score and Plan: 1 and Propofol infusion, Ondansetron and Treatment may vary due to age or medical condition  Airway Management Planned: Natural Airway and Nasal Cannula  Additional Equipment: None  Intra-op Plan:   Post-operative Plan:   Informed Consent: I have reviewed the patients History and Physical, chart, labs and discussed the procedure including the risks, benefits and alternatives for the proposed anesthesia with the patient or authorized representative who has indicated his/her understanding and acceptance.       Plan Discussed with: CRNA  Anesthesia Plan Comments: (See PAT note written 05/30/2020 by Myra Gianotti, PA-C. Complex history includes cirrhosis, thrombocytopenia, diastolic CHF, pulmonary hypertension (on triple therapy), CKD, anemia. Recent hematology and HF follow-up. )       Anesthesia Quick Evaluation

## 2020-05-30 NOTE — Progress Notes (Unsigned)
[place] DEVICE PROGRAMMING   Patient Information: Name: Darren Mueller  DOB: 02/04/1951  MRN: [id number] Planned Procedure: Roney Jaffe Vitrectomy Surgeon: Dr Sherlynn Stalls  Date of Procedure: 05/31/20  Cautery will be used.  Position during surgery: Supine   Please send documentation back to:  [facility] (Fax # [phone])   Marveen Reeks, RN  05/30/2020 11:01 AM        Device Information:   Clinic EP Physician:   Thompson Grayer, MD Device Type:  LINQ II( loop recorder) Manufacturer and Phone #:  Medtronic: [id number] Pacemaker Dependent?:  No Date of Last Device Check:  05/24/20        Normal Device Function?:  Yes     Electrophysiologist's Recommendations:    Have magnet available.  Provide continuous ECG monitoring when magnet is used or reprogramming is to be performed.   Procedure should not interfere with device function.  No device programming or magnet placement needed.  Per Device Clinic Standing Orders, Drake Leach  05/30/2020 3:32 PM

## 2020-05-30 NOTE — Telephone Encounter (Signed)
Palliative care SW called and LVM for patient daughter to schedule/confirm in home visit for July. Awaiting return call.

## 2020-05-30 NOTE — Progress Notes (Signed)
Anesthesia Chart Review: SAME DAY WORK-UP   Case: [id number] Date/Time: 05/31/20 0845   Procedures:      PARS PLANA VITRECTOMY WITH 25 GAUGE (Left )     RETINECTOMY; INJECTION OF SILICONE OIL (Left )   Anesthesia type: Monitor Anesthesia Care   Pre-op diagnosis: LEFT EYE RETINAL DETACHMENT   Location: [facility] ROOM 08 / [facility]   Surgeons: Sherlynn Stalls, MD      DISCUSSION: Patient is 69 year old male scheduled for the above procedure. He is s/p vitrectomy endolaser membrane peel silicone oil right eye on 04/20/20 [facility]) under MAC.   History includes former smoker, HTN, GERD, alcohol dependence (sober since 12/2019), cirrhosis (with ascites, splenomegaly, thrombocytopenia; last admission for hepatic encephalopathy 02/2020), CAD (minimal CAD 2017), chronic diastolic CHF, pulmonary hypertension, COPD, hearing loss, vertigo, CKD, anemia, anxiety, OSA (intolerant to CPAP), hypothyroidism, syncope (2017, 2018, s/p LINQ Medtronic loop records 01/01/17), hypertriglyceridemia, pulmonary nodule (2014 left lung nodule stable on 12/24/15 CT and felt compatible with a benign nodule).   Last HF follow-up on 04/26/20 with [name], PA-C.  Overall doing well.  Was recovering from vitrectomy.  He was euvolemic on exam.  Recent labs showed stable creatinine.  Continue triple therapy for pulmonary hypertension.  Follow-up in 2 to 3 months plan. No new observations noted on 04/03/20-05/06/20 loop recorder interrogation. His EKG appears chronically abnormal at least for the past year with diffuse T wave inversion and ST depression V1-3.   Last hematology visit with Dr Alen Blew was on 04/04/20 for follow-up thrombocytopenia attributed to splenomegaly and sequestration from cirrhosis of the liver.  His platelet count has been low since at least 2012 and drops below 50 K noted ~ 2017 and ~ 30K range since 2019. Given his overall stable counts without active bleeding, no intervention recommended.  Creatinine has been ~ 2-4  range since 10/2019, last 2.43 on 04/23/20. He is for updated labs prior to procedure.   He denied shortness of breath, cough, fever, chest pain per PAT RN phone interview.  05/29/2020 presurgical COVID-19 test negative. Reviewed history with anesthesiologist Bryson Ha, MD. Patient with complex history with recent cardiology and hematology follow-up. He tolerated similar surgery last [facility] at [facility]. He is a same day work-up, so anesthesia team to evaluate on the day of surgery.    VS: Ht _0  (1.854 m)   Wt 88.9 kg   BMI 25.86 kg/m   Wt Readings from Last 3 Encounters:  05/02/20 86.3 kg  04/26/20 83.5 kg  04/04/20 86.3 kg   BP Readings from Last 3 Encounters:  05/02/20 (!) 98/58  04/26/20 98/62  04/16/20 117/63   Pulse Readings from Last 3 Encounters:  05/02/20 83  04/26/20 74  04/16/20 82    PROVIDERS: Mosie Lukes, MD is PCP  - Glori Bickers, MD is HF cardiologist - Thompson Grayer, MD is EP cardiologist (loop recorder) - Owens Loffler, MD is GI - Ellouise Newer, MD is neurologist. He had a single episdoe of shaking on 11/18/17. He had a normal EEG on 01/27/18. He was treated with Keppra, but no further seizures following discontinuation. Last visit 12/30/18 with as needed follow-up.  Chesley Mires, MD is pulmonologist (last visit 03/11/16) Zola Button, MD is HEM.  Donato Heinz, MD is nephrologist. 06/17/19 office visit scanned under Media tab. CKD felt likely due to hypertensive nephrosclerosis with multiple episodes of AKI/CKD in setting of decompensated CHF.   LABS: For day of surgery.  Currently, latest lab results include: Lab Results  Component Value Date   WBC 2.3 (L) 04/04/2020   HGB 9.1 (L) 04/04/2020   HCT 28.9 (L) 04/04/2020   PLT 25 (LL) 04/04/2020   GLUCOSE 95 04/23/2020   ALT 15 04/23/2020   AST 33 04/23/2020   NA 137 04/23/2020   K 3.8 04/23/2020   CL 110 04/23/2020   CREATININE 2.43 (H) 04/23/2020   BUN 31 (H) 04/23/2020    CO2 18 (L) 04/23/2020   INR 1.2 02/26/2020   CBC Latest Ref Rng & Units 04/04/2020 03/22/2020 03/14/2020  WBC 4.0 - 10.5 K/uL 2.3(L) 2.3 Repeated and verified X2.(L) 2.2 Repeated and verified X2.(L)  Hemoglobin 13.0 - 17.0 g/dL 9.1(L) 10.3(L) 10.1(L)  Hematocrit 39 - 52 % 28.9(L) 31.3(L) 30.6(L)  Platelets 150 - 400 K/uL 25(LL) 28.0 Repeated and verified X2.(LL) 30.0 Repeated and verified X2.(LL)   PFTS > 3 years ago.   IMAGES: UA Abd (limted) 02/27/20: IMPRESSION: Cirrhosis. Evidence of portal hypertension including mild ascites and splenomegaly.  CT Head 02/25/20: IMPRESSION: 1. No evidence of acute intracranial abnormality. 2. Atrophy and chronic small-vessel white matter ischemic changes.  CXR 02/25/20: FINDINGS: Cardiomegaly and possible mild pulmonary vascular congestion again noted. There is no evidence of focal airspace disease, pulmonary edema, suspicious pulmonary nodule/mass, pleural effusion, or pneumothorax. No acute bony abnormalities are identified. IMPRESSION: Cardiomegaly with possible mild pulmonary vascular congestion.   EKG: 02/25/20: Sinus rhythm Prolonged PR interval Left atrial enlargement Posterior infarct, acute (LCx) Lateral leads are also involved Prolonged QT interval ST depression V1-V3, suggest recording posterior leads morphology similar to previous, no acute changes   CV: Echo 01/21/20: IMPRESSIONS  1. Left ventricular ejection fraction, by estimation, is 60 to 65%. The  left ventricle has normal function. The left ventricle has no regional  wall motion abnormalities. There is moderate left ventricular hypertrophy.  Left ventricular diastolic  parameters are consistent with Grade II diastolic dysfunction  (pseudonormalization). There is the interventricular septum is flattened  in systole and diastole, consistent with right ventricular pressure and  volume overload.  2. Prominent RV trabeculation. Right ventricular systolic function  is  severely reduced. The right ventricular size is severely enlarged. mildly  increased right ventricular wall thickness. There is severely elevated  pulmonary artery systolic pressure.  The estimated right ventricular systolic pressure is 36.6 mmHg.  3. Left atrial size was mildly dilated.  4. Right atrial size was severely dilated.  5. The mitral valve is grossly normal. Trivial mitral valve  regurgitation.  6. Tricuspid valve regurgitation is moderate.  7. The aortic valve is tricuspid. Aortic valve regurgitation is mild.  8. The inferior vena cava is dilated in size with <50% respiratory  variability, suggesting right atrial pressure of 15 mmHg.    Cardiac event monitor 07/29/16-08/27/16: Study Highlights: Sinus rhythm. No significant arrhythmia or pauses.    RHC/LHC 04/15/16:  Mid RCA lesion, 20% stenosed.  Dist LAD lesion, 20% stenosed.   Findings: Ao = 107/70 (87) LV =  106/3/11 RA =  4 RV = 74/11 PA = 74/26 (45) PCW = 6 Fick cardiac output/index = 4.6.2.2 PVR = 8.5 Ao sat = 95% PA sat = 61%, 58%  Assessment: 1. Minimal CAD 2. Moderate pulmonary HTN (improved from previous cath) 3. Normal left sided pressures after diuresis  Plan/Discussion: Continue medical therapy.   Past Medical History:  Diagnosis Date  . Alcohol dependence in remission ([place]) 03/22/2011   In remission   . Alcoholic cirrhosis of  liver with ascites ([place]) 2014  . Anemia   . Anginal pain ([place])    with SOB admitted 04/20/2019  . Anxiety   . Cardiomegaly 02/2019   Noted on CXR  . CHF (congestive heart failure) ([place])   . CKD (chronic kidney disease), stage III 07/18/2013  . COPD (chronic obstructive pulmonary disease) ([place])   . Coronary artery disease   . GERD (gastroesophageal reflux disease)   . Gout   . Hearing loss    no hearing aids  . Hypertension   . Hypertriglyceridemia 03/17/2017  . PAH (pulmonary artery hypertension) ([facility])    RV failure  . Pancytopenia ([place])   .  Pleural effusion 2014   Noted on CT:   Moderately large simple layering right pleural effusion   . Pulmonary nodule 2014   Noted on CT: An 8 mm pulmonary nodule in the periphery of the left lower lobe  . Rectal bleeding   . Seizures ([place]) 11/18/2017   "shaking episode" - no meds  . Sleep apnea    does not wear CPAP  . Splenomegaly 12/2015   Noted on CT  . Thoracic aorta atherosclerosis ([place]) 12/2017  . Thrombocytopenia ([place]) 06/29/2016  . Unspecified hypothyroidism 01/25/2013  . Vertigo     Past Surgical History:  Procedure Laterality Date  . CARDIAC CATHETERIZATION N/A 12/21/2015   Procedure: Right Heart Cath;  Surgeon: Jolaine Artist, MD;  Location: [facility] CV LAB;  Service: Cardiovascular;  Laterality: N/A;  . CARDIAC CATHETERIZATION N/A 04/15/2016   Procedure: Right/Left Heart Cath and Coronary Angiography;  Surgeon: Jolaine Artist, MD;  Location: [place] CV LAB;  Service: Cardiovascular;  Laterality: N/A;  . COLONOSCOPY N/A 12/08/2013   Procedure: COLONOSCOPY;  Surgeon: Milus Banister, MD;  Location: [facility] ENDOS[facility];  Service: Endoscopy;  Laterality: N/A;  . COLONOSCOPY WITH PROPOFOL N/A 03/24/2019   Procedure: COLONOSCOPY WITH PROPOFOL;  Surgeon: Milus Banister, MD;  Location: [facility];  Service: Endoscopy;  Laterality: N/A;  Draw CBC in preop  . COLONOSCOPY WITH PROPOFOL N/A 04/28/2019   Procedure: COLONOSCOPY WITH PROPOFOL;  Surgeon: Milus Banister, MD;  Location: [facility];  Service: Endoscopy;  Laterality: N/A;  . ESOPHAGOGASTRODUODENOSCOPY (EGD) WITH PROPOFOL N/A 03/27/2016   Procedure: ESOPHAGOGASTRODUODENOSCOPY (EGD) WITH PROPOFOL;  Surgeon: Milus Banister, MD;  Location: [facility];  Service: Endoscopy;  Laterality: N/A;  . ESOPHAGOGASTRODUODENOSCOPY (EGD) WITH PROPOFOL N/A 02/14/2019   Procedure: ESOPHAGOGASTRODUODENOSCOPY (EGD) WITH PROPOFOL;  Surgeon: Jerene Bears, MD;  Location: [facility] ENDOSCOPY;  Service: Gastroenterology;  Laterality: N/A;  . EYE  SURGERY Bilateral 03/15/2019   lasix  . EYE SURGERY Right 04/2020   [place], [place]  . GAS/FLUID EXCHANGE Right 09/22/2019   Procedure: GAS/FLUID EXCHANGE (C3F8) RIGHT EYE;  Surgeon: Sherlynn Stalls, MD;  Location: [facility];  Service: Ophthalmology;  Laterality: Right;  . HEMOSTASIS CLIP PLACEMENT  04/28/2019   Procedure: HEMOSTASIS CLIP PLACEMENT;  Surgeon: Milus Banister, MD;  Location: [facility]OS[facility];  Service: Endoscopy;;  . LOOP RECORDER INSERTION N/A 01/01/2017   Procedure: Loop Recorder Insertion;  Surgeon: Thompson Grayer, MD;  Location: [place] CV LAB;  Service: Cardiovascular;  Laterality: N/A;  . PARS PLANA VITRECTOMY Right 09/22/2019   Procedure: PARS PLANA VITRECTOMY WITH 25 GAUGE RIGHT EYE;  Surgeon: Sherlynn Stalls, MD;  Location: [facility];  Service: Ophthalmology;  Laterality: Right;  . PHOTOCOAGULATION WITH LASER Right 09/22/2019   Procedure: PHOTOCOAGULATION WITH LASER RIGHT EYE;  Surgeon: Sherlynn Stalls, MD;  Location: [facility];  Service: Ophthalmology;  Laterality: Right;  . POLYPECTOMY  04/28/2019   Procedure: POLYPECTOMY;  Surgeon: Milus Banister, MD;  Location: [facility];  Service: Endoscopy;;  . RIGHT HEART CATHETERIZATION Right 06/06/2014   Procedure: RIGHT HEART CATH;  Surgeon: Jolaine Artist, MD;  Location: [facility] CATH LAB;  Service: Cardiovascular;  Laterality: Right;  . UVULOPALATOPHARYNGOPLASTY  1999    MEDICATIONS: No current facility-administered medications for this encounter.   Marland Kitchen acetaminophen (TYLENOL) 500 MG tablet  . ALPRAZolam (XANAX) 0.25 MG tablet  . ferrous gluconate (FERGON) 324 MG tablet  . FLUoxetine (PROZAC) 20 MG tablet  . folic acid (FOLVITE) 1 MG tablet  . lactulose (CHRONULAC) 10 GM/15ML solution  . macitentan (OPSUMIT) 10 MG tablet  . omeprazole (PRILOSEC) 40 MG capsule  . Potassium Chloride ER 20 MEQ TBCR  . prednisoLONE acetate (PRED FORTE) 1 % ophthalmic suspension  . rosuvastatin (CRESTOR) 5 MG tablet  . Selexipag (UPTRAVI) 1000 MCG TABS   . tadalafil, PAH, (ADCIRCA) 20 MG tablet  . thiamine 100 MG tablet  . torsemide (DEMADEX) 20 MG tablet  . Blood Pressure Monitoring (BLOOD PRESSURE CUFF) MISC  . Ferrous Fumarate (HEMOCYTE) 324 (106 Fe) MG TABS tablet    Myra Gianotti, PA-C Surgical Short Stay/Anesthesiology [facility] Phone [phone] [facility] Phone [phone] 05/30/2020 4:16 PM

## 2020-05-30 NOTE — Progress Notes (Signed)
Patient denies shortness of breath, fever, cough or chest pain.  PCP - Dr Penni Homans Cardiologist -  Dr [name] Bensimhon/ Dr Thompson Grayer (Loop-Medtronic) Gertie Fey - Dr Owens Loffler Neuro - Dr Ellouise Newer Pulmonary - Dr Chesley Mires (last ov 03/2016)  Chest x-ray - 02/25/20 EKG - 02/25/20 Stress Test -  ECHO - 01/21/20 Cardiac Cath - 04/15/16  ICD Loop- Medtronic Reveal Linq Model.  Periop cardiac device programming orders sent.  Emailed [employer] manager.  Last remote check was on 05/08/20  Sleep Study - Yes CPAP - Does not use CPAP  Anesthesia review: Yes  STOP now taking any Aspirin (unless otherwise instructed by your surgeon), Aleve, Naproxen, Ibuprofen, Motrin, Advil, Goody's, BC's, all herbal medications, fish oil, and all vitamins.   Coronavirus Screening Covid test on 05/29/20 was negative.  Patient verbalized understanding of instructions that were given via phone.

## 2020-05-31 ENCOUNTER — Encounter (HOSPITAL_COMMUNITY)
Admission: RE | Disposition: A | Discharge status: Home / Self Care | Payer: Self-pay | Source: Ambulatory Visit | Attending: Ophthalmology

## 2020-05-31 ENCOUNTER — Ambulatory Visit (HOSPITAL_COMMUNITY): Payer: Medicare Other | Admitting: Certified Registered Nurse Anesthetist

## 2020-05-31 ENCOUNTER — Encounter (HOSPITAL_COMMUNITY): Payer: Self-pay | Admitting: Ophthalmology

## 2020-05-31 ENCOUNTER — Other Ambulatory Visit: Payer: Self-pay

## 2020-05-31 ENCOUNTER — Ambulatory Visit (HOSPITAL_COMMUNITY)
Admission: RE | Admit: 2020-05-31 | Discharge: 2020-05-31 | Disposition: A | Discharge status: Home / Self Care | Payer: Medicare Other | Source: Ambulatory Visit | Attending: Ophthalmology | Admitting: Ophthalmology

## 2020-05-31 DIAGNOSIS — I130 Hypertensive heart and chronic kidney disease with heart failure and stage 1 through stage 4 chronic kidney disease, or unspecified chronic kidney disease: Secondary | ICD-10-CM | POA: Diagnosis not present

## 2020-05-31 DIAGNOSIS — N183 Chronic kidney disease, stage 3 unspecified: Secondary | ICD-10-CM | POA: Diagnosis not present

## 2020-05-31 DIAGNOSIS — I2721 Secondary pulmonary arterial hypertension: Secondary | ICD-10-CM | POA: Insufficient documentation

## 2020-05-31 DIAGNOSIS — I110 Hypertensive heart disease with heart failure: Secondary | ICD-10-CM | POA: Diagnosis not present

## 2020-05-31 DIAGNOSIS — G4730 Sleep apnea, unspecified: Secondary | ICD-10-CM | POA: Insufficient documentation

## 2020-05-31 DIAGNOSIS — Z6372 Alcoholism and drug addiction in family: Secondary | ICD-10-CM | POA: Insufficient documentation

## 2020-05-31 DIAGNOSIS — Z886 Allergy status to analgesic agent status: Secondary | ICD-10-CM | POA: Diagnosis not present

## 2020-05-31 DIAGNOSIS — F419 Anxiety disorder, unspecified: Secondary | ICD-10-CM | POA: Insufficient documentation

## 2020-05-31 DIAGNOSIS — M109 Gout, unspecified: Secondary | ICD-10-CM | POA: Diagnosis not present

## 2020-05-31 DIAGNOSIS — Z841 Family history of disorders of kidney and ureter: Secondary | ICD-10-CM | POA: Diagnosis not present

## 2020-05-31 DIAGNOSIS — E039 Hypothyroidism, unspecified: Secondary | ICD-10-CM | POA: Insufficient documentation

## 2020-05-31 DIAGNOSIS — H33022 Retinal detachment with multiple breaks, left eye: Secondary | ICD-10-CM | POA: Insufficient documentation

## 2020-05-31 DIAGNOSIS — K219 Gastro-esophageal reflux disease without esophagitis: Secondary | ICD-10-CM | POA: Insufficient documentation

## 2020-05-31 DIAGNOSIS — I5033 Acute on chronic diastolic (congestive) heart failure: Secondary | ICD-10-CM | POA: Diagnosis not present

## 2020-05-31 DIAGNOSIS — J449 Chronic obstructive pulmonary disease, unspecified: Secondary | ICD-10-CM | POA: Diagnosis not present

## 2020-05-31 DIAGNOSIS — Z888 Allergy status to other drugs, medicaments and biological substances status: Secondary | ICD-10-CM | POA: Diagnosis not present

## 2020-05-31 DIAGNOSIS — R569 Unspecified convulsions: Secondary | ICD-10-CM | POA: Insufficient documentation

## 2020-05-31 DIAGNOSIS — H3322 Serous retinal detachment, left eye: Secondary | ICD-10-CM | POA: Diagnosis not present

## 2020-05-31 DIAGNOSIS — F1021 Alcohol dependence, in remission: Secondary | ICD-10-CM | POA: Insufficient documentation

## 2020-05-31 DIAGNOSIS — E781 Pure hyperglyceridemia: Secondary | ICD-10-CM | POA: Diagnosis not present

## 2020-05-31 DIAGNOSIS — I509 Heart failure, unspecified: Secondary | ICD-10-CM | POA: Diagnosis not present

## 2020-05-31 DIAGNOSIS — Z79899 Other long term (current) drug therapy: Secondary | ICD-10-CM | POA: Insufficient documentation

## 2020-05-31 DIAGNOSIS — I2510 Atherosclerotic heart disease of native coronary artery without angina pectoris: Secondary | ICD-10-CM | POA: Diagnosis not present

## 2020-05-31 DIAGNOSIS — Z87891 Personal history of nicotine dependence: Secondary | ICD-10-CM | POA: Diagnosis not present

## 2020-05-31 DIAGNOSIS — Z8249 Family history of ischemic heart disease and other diseases of the circulatory system: Secondary | ICD-10-CM | POA: Diagnosis not present

## 2020-05-31 DIAGNOSIS — H3522 Other non-diabetic proliferative retinopathy, left eye: Secondary | ICD-10-CM | POA: Diagnosis not present

## 2020-05-31 HISTORY — DX: Anemia, unspecified: D64.9

## 2020-05-31 HISTORY — PX: PARS PLANA VITRECTOMY: SHX2166

## 2020-05-31 HISTORY — DX: Anxiety disorder, unspecified: F41.9

## 2020-05-31 HISTORY — PX: PERFLUORONE INJECTION: SHX5302

## 2020-05-31 HISTORY — PX: INJECTION OF SILICONE OIL: SHX6422

## 2020-05-31 LAB — CBC
HCT: 32.6 % — ABNORMAL LOW (ref 39.0–52.0)
Hemoglobin: 9.7 g/dL — ABNORMAL LOW (ref 13.0–17.0)
MCH: 32 pg (ref 26.0–34.0)
MCHC: 29.8 g/dL — ABNORMAL LOW (ref 30.0–36.0)
MCV: 107.6 fL — ABNORMAL HIGH (ref 80.0–100.0)
Platelets: 21 10*3/uL — CL (ref 150–400)
RBC: 3.03 MIL/uL — ABNORMAL LOW (ref 4.22–5.81)
RDW: 17.1 % — ABNORMAL HIGH (ref 11.5–15.5)
WBC: 2.3 10*3/uL — ABNORMAL LOW (ref 4.0–10.5)
nRBC: 0 % (ref 0.0–0.2)

## 2020-05-31 LAB — COMPREHENSIVE METABOLIC PANEL
ALT: 20 U/L (ref 0–44)
AST: 32 U/L (ref 15–41)
Albumin: 2.9 g/dL — ABNORMAL LOW (ref 3.5–5.0)
Alkaline Phosphatase: 194 U/L — ABNORMAL HIGH (ref 38–126)
Anion gap: 10 (ref 5–15)
BUN: 32 mg/dL — ABNORMAL HIGH (ref 8–23)
CO2: 19 mmol/L — ABNORMAL LOW (ref 22–32)
Calcium: 8.3 mg/dL — ABNORMAL LOW (ref 8.9–10.3)
Chloride: 108 mmol/L (ref 98–111)
Creatinine, Ser: 2.72 mg/dL — ABNORMAL HIGH (ref 0.61–1.24)
GFR calc Af Amer: 26 mL/min — ABNORMAL LOW (ref >60)
GFR calc non Af Amer: 23 mL/min — ABNORMAL LOW (ref >60)
Glucose, Bld: 96 mg/dL (ref 70–99)
Potassium: 3.9 mmol/L (ref 3.5–5.1)
Sodium: 137 mmol/L (ref 135–145)
Total Bilirubin: 1.9 mg/dL — ABNORMAL HIGH (ref 0.3–1.2)
Total Protein: 6.1 g/dL — ABNORMAL LOW (ref 6.5–8.1)

## 2020-05-31 SURGERY — PARS PLANA VITRECTOMY WITH 25 GAUGE
Anesthesia: Monitor Anesthesia Care | Site: Eye | Laterality: Left

## 2020-05-31 MED ORDER — MIDAZOLAM HCL 5 MG/5ML IJ SOLN
INTRAMUSCULAR | Status: DC | PRN
  Administered 2020-05-31: 2 mg via INTRAVENOUS

## 2020-05-31 MED ORDER — DEXAMETHASONE SODIUM PHOSPHATE 10 MG/ML IJ SOLN
INTRAMUSCULAR | Status: DC | PRN
  Administered 2020-05-31: 10 mg via INTRAVENOUS

## 2020-05-31 MED ORDER — SODIUM CHLORIDE 0.9 % IV SOLN: INTRAVENOUS | Status: DC

## 2020-05-31 MED ORDER — PHENYLEPHRINE HCL 2.5 % OP SOLN
1.0000 [drp] | OPHTHALMIC | Status: DC | PRN
  Administered 2020-05-31 (×3): 1 [drp] via OPHTHALMIC
  Filled 2020-05-31: qty 2

## 2020-05-31 MED ORDER — DEXAMETHASONE SODIUM PHOSPHATE 10 MG/ML IJ SOLN
INTRAMUSCULAR | Status: AC
  Filled 2020-05-31: qty 1

## 2020-05-31 MED ORDER — ORAL CARE MOUTH RINSE: 15.0000 mL | Freq: Once | OROMUCOSAL | Status: AC

## 2020-05-31 MED ORDER — LIDOCAINE HCL (PF) 2 % IJ SOLN
INTRAMUSCULAR | Status: AC
  Filled 2020-05-31: qty 10

## 2020-05-31 MED ORDER — MIDAZOLAM HCL 2 MG/2ML IJ SOLN
INTRAMUSCULAR | Status: AC
  Filled 2020-05-31: qty 2

## 2020-05-31 MED ORDER — ATROPINE SULFATE 1 % OP SOLN
OPHTHALMIC | Status: AC
  Filled 2020-05-31: qty 5

## 2020-05-31 MED ORDER — TOBRAMYCIN-DEXAMETHASONE 0.3-0.1 % OP OINT
TOPICAL_OINTMENT | OPHTHALMIC | Status: AC
  Filled 2020-05-31: qty 3.5

## 2020-05-31 MED ORDER — INDOCYANINE GREEN 25 MG IV SOLR
INTRAVENOUS | Status: AC
  Filled 2020-05-31: qty 10

## 2020-05-31 MED ORDER — ATROPINE SULFATE 1 % OP SOLN
1.0000 [drp] | OPHTHALMIC | Status: DC | PRN
  Administered 2020-05-31 (×3): 1 [drp] via OPHTHALMIC
  Filled 2020-05-31: qty 5

## 2020-05-31 MED ORDER — CEFAZOLIN SUBCONJUNCTIVAL INJECTION 100 MG/0.5 ML
100.0000 mg | INJECTION | SUBCONJUNCTIVAL | Status: DC
  Filled 2020-05-31: qty 5

## 2020-05-31 MED ORDER — BSS IO SOLN
INTRAOCULAR | Status: AC
  Filled 2020-05-31: qty 15

## 2020-05-31 MED ORDER — EPINEPHRINE PF 1 MG/ML IJ SOLN
INTRAOCULAR | Status: DC | PRN
  Administered 2020-05-31: 500.3 mL

## 2020-05-31 MED ORDER — LIDOCAINE HCL 2 % IJ SOLN
INTRAMUSCULAR | Status: DC | PRN
  Administered 2020-05-31: 6 mL via OPHTHALMIC

## 2020-05-31 MED ORDER — HYALURONIDASE HUMAN 150 UNIT/ML IJ SOLN
INTRAMUSCULAR | Status: AC
  Filled 2020-05-31: qty 1

## 2020-05-31 MED ORDER — PROPOFOL 10 MG/ML IV BOLUS
INTRAVENOUS | Status: DC | PRN
  Administered 2020-05-31: 50 mg via INTRAVENOUS
  Administered 2020-05-31: 20 mg via INTRAVENOUS

## 2020-05-31 MED ORDER — DEXTROSE 5 % IV SOLN
INTRAVENOUS | Status: AC
  Filled 2020-05-31: qty 100

## 2020-05-31 MED ORDER — PROPOFOL 10 MG/ML IV BOLUS
INTRAVENOUS | Status: AC
  Filled 2020-05-31: qty 20

## 2020-05-31 MED ORDER — BUPIVACAINE HCL (PF) 0.75 % IJ SOLN
INTRAMUSCULAR | Status: AC
  Filled 2020-05-31: qty 10

## 2020-05-31 MED ORDER — ATROPINE SULFATE 1 % OP SOLN
OPHTHALMIC | Status: DC | PRN
  Administered 2020-05-31: 1 [drp] via OPHTHALMIC

## 2020-05-31 MED ORDER — BSS IO SOLN
INTRAOCULAR | Status: DC | PRN
  Administered 2020-05-31: 15 mL

## 2020-05-31 MED ORDER — BSS PLUS IO SOLN
INTRAOCULAR | Status: AC
  Filled 2020-05-31: qty 500

## 2020-05-31 MED ORDER — NA CHONDROIT SULF-NA HYALURON 40-30 MG/ML IO SOLN
INTRAOCULAR | Status: AC
  Filled 2020-05-31: qty 0.5

## 2020-05-31 MED ORDER — SODIUM HYALURONATE 10 MG/ML IO SOLN
INTRAOCULAR | Status: AC
  Filled 2020-05-31: qty 0.85

## 2020-05-31 MED ORDER — LIDOCAINE 2% (20 MG/ML) 5 ML SYRINGE
INTRAMUSCULAR | Status: DC | PRN
  Administered 2020-05-31: 40 mg via INTRAVENOUS

## 2020-05-31 MED ORDER — TOBRAMYCIN-DEXAMETHASONE 0.3-0.1 % OP OINT
TOPICAL_OINTMENT | OPHTHALMIC | Status: DC | PRN
  Administered 2020-05-31: 1 via OPHTHALMIC

## 2020-05-31 MED ORDER — PROVISC 10 MG/ML IO SOLN
INTRAOCULAR | Status: DC | PRN
  Administered 2020-05-31: .85 mL via INTRAOCULAR

## 2020-05-31 MED ORDER — CHLORHEXIDINE GLUCONATE 0.12 % MT SOLN
15.0000 mL | Freq: Once | OROMUCOSAL | Status: AC
  Administered 2020-05-31: 15 mL via OROMUCOSAL
  Filled 2020-05-31: qty 15

## 2020-05-31 MED ORDER — CEFAZOLIN SUBCONJUNCTIVAL INJECTION 100 MG/0.5 ML
INJECTION | SUBCONJUNCTIVAL | Status: DC | PRN
  Administered 2020-05-31: 100 mg via SUBCONJUNCTIVAL

## 2020-05-31 MED ORDER — EPINEPHRINE PF 1 MG/ML IJ SOLN
INTRAMUSCULAR | Status: AC
  Filled 2020-05-31: qty 1

## 2020-05-31 SURGICAL SUPPLY — 65 items
BAND WRIST GAS GREEN (MISCELLANEOUS) IMPLANT
BLADE MINI 60D BLUE (BLADE) ×3 IMPLANT
BLADE MVR KNIFE 20G (BLADE) IMPLANT
BNDG EYE OVAL (GAUZE/BANDAGES/DRESSINGS) ×3 IMPLANT
CANNULA ANT CHAM MAIN (OPHTHALMIC RELATED) IMPLANT
CANNULA ANTERIOR CHAMBER 27GA (MISCELLANEOUS) IMPLANT
CANNULA DUAL BORE 23G (CANNULA) IMPLANT
CANNULA DUALBORE 25G (CANNULA) ×3 IMPLANT
CANNULA VLV SOFT TIP 25GA (OPHTHALMIC) ×3 IMPLANT
CAUTERY EYE LOW TEMP 1300F FIN (OPHTHALMIC RELATED) IMPLANT
CLOSURE STERI-STRIP 1/2X4 (GAUZE/BANDAGES/DRESSINGS) ×1
CLSR STERI-STRIP ANTIMIC 1/2X4 (GAUZE/BANDAGES/DRESSINGS) ×2 IMPLANT
CORD BIPOLAR FORCEPS 12FT (ELECTRODE) ×3 IMPLANT
COVER MAYO STAND STRL (DRAPES) IMPLANT
COVER WAND RF STERILE (DRAPES) IMPLANT
DRAPE HALF SHEET 40X57 (DRAPES) ×3 IMPLANT
DRAPE INCISE 51X51 W/FILM STRL (DRAPES) IMPLANT
DRAPE RETRACTOR (MISCELLANEOUS) ×3 IMPLANT
ERASER HMR WETFIELD 23G BP (MISCELLANEOUS) IMPLANT
FILTER BLUE MILLIPORE (MISCELLANEOUS) IMPLANT
FORCEPS ECKARDT ILM 25G SERR (OPHTHALMIC RELATED) IMPLANT
FORCEPS GRIESHABER ILM 25G A (INSTRUMENTS) ×3 IMPLANT
GAS AUTO FILL CONSTEL (OPHTHALMIC)
GAS AUTO FILL CONSTELLATION (OPHTHALMIC) IMPLANT
GAS WRIST BAND GREEN (MISCELLANEOUS)
GLOVE BIO SURGEON STRL SZ7.5 (GLOVE) ×3 IMPLANT
GOWN STRL REUS W/ TWL LRG LVL3 (GOWN DISPOSABLE) ×2 IMPLANT
GOWN STRL REUS W/TWL LRG LVL3 (GOWN DISPOSABLE) ×4
HANDLE PNEUMATIC FOR CONSTEL (OPHTHALMIC) ×3 IMPLANT
KIT BASIN OR (CUSTOM PROCEDURE TRAY) ×3 IMPLANT
KIT PERFLUORON PROCEDURE 5ML (MISCELLANEOUS) ×3 IMPLANT
KIT TURNOVER KIT B (KITS) IMPLANT
LENS BIOM SUPER VIEW SET DISP (MISCELLANEOUS) ×6 IMPLANT
MICROPICK 25G (MISCELLANEOUS)
NEEDLE 18GX1X1/2 (RX/OR ONLY) (NEEDLE) ×3 IMPLANT
NEEDLE 25GX 5/8IN NON SAFETY (NEEDLE) ×3 IMPLANT
NEEDLE FILTER BLUNT 18X 1/2SAF (NEEDLE)
NEEDLE FILTER BLUNT 18X1 1/2 (NEEDLE) IMPLANT
NEEDLE HYPO 25GX1X1/2 BEV (NEEDLE) IMPLANT
NEEDLE HYPO 30X.5 LL (NEEDLE) ×3 IMPLANT
NEEDLE RETROBULBAR 25GX1.5 (NEEDLE) ×3 IMPLANT
NS IRRIG 1000ML POUR BTL (IV SOLUTION) ×3 IMPLANT
OIL SILICONE OPHTHALMIC 1000 (Ophthalmic Related) ×3 IMPLANT
PAD ARMBOARD 7.5X6 YLW CONV (MISCELLANEOUS) ×6 IMPLANT
PAK PIK VITRECTOMY CVS 25GA (OPHTHALMIC) ×6 IMPLANT
PENCIL BIPOLAR 25GA STR DISP (OPHTHALMIC RELATED) IMPLANT
PICK MICROPICK 25G (MISCELLANEOUS) IMPLANT
PROBE LASER ILLUM FLEX CVD 25G (OPHTHALMIC) ×3 IMPLANT
ROLLS DENTAL (MISCELLANEOUS) IMPLANT
SCRAPER DIAMOND 25GA (OPHTHALMIC RELATED) IMPLANT
SET INJECTOR OIL FLUID CONSTEL (OPHTHALMIC) ×3 IMPLANT
SHIELD EYE LENSE ONLY DISP (GAUZE/BANDAGES/DRESSINGS) ×3 IMPLANT
STOCKINETTE IMPERVIOUS 9X36 MD (GAUZE/BANDAGES/DRESSINGS) ×6 IMPLANT
STOPCOCK 4 WAY LG BORE MALE ST (IV SETS) IMPLANT
SUT ETHILON 8 0 TG100 8 (SUTURE) IMPLANT
SUT VICRYL 7 0 TG140 8 (SUTURE) IMPLANT
SUT VICRYL 8 0 TG140 8 (SUTURE) IMPLANT
SUT VICRYL ABS 6-0 S29 18IN (SUTURE) IMPLANT
SYR 10ML LL (SYRINGE) IMPLANT
SYR 20ML LL LF (SYRINGE) ×3 IMPLANT
SYR 5ML LL (SYRINGE) ×3 IMPLANT
SYR TB 1ML LUER SLIP (SYRINGE) IMPLANT
TUBE CONNECTING 12'X1/4 (SUCTIONS)
TUBE CONNECTING 12X1/4 (SUCTIONS) IMPLANT
WATER STERILE IRR 1000ML POUR (IV SOLUTION) ×3 IMPLANT

## 2020-05-31 NOTE — H&P (Signed)
Darren Mueller is an 69 y.o. male.   Chief Complaint: vision loss left eye HPI: dx with Rd OS  Past Medical History:  Diagnosis Date  . Alcohol dependence in remission ([place]) 03/22/2011   In remission   . Alcoholic cirrhosis of liver with ascites ([place]) 2014  . Anemia   . Anginal pain ([place])    with SOB admitted 04/20/2019  . Anxiety   . Cardiomegaly 02/2019   Noted on CXR  . CHF (congestive heart failure) ([place])   . CKD (chronic kidney disease), stage III 07/18/2013  . COPD (chronic obstructive pulmonary disease) ([place])   . Coronary artery disease   . GERD (gastroesophageal reflux disease)   . Gout   . Hearing loss    no hearing aids  . Hypertension   . Hypertriglyceridemia 03/17/2017  . PAH (pulmonary artery hypertension) ([facility])    RV failure  . Pancytopenia ([place])   . Pleural effusion 2014   Noted on CT:   Moderately large simple layering right pleural effusion   . Pulmonary nodule 2014   Noted on CT: An 8 mm pulmonary nodule in the periphery of the left lower lobe  . Rectal bleeding   . Seizures ([place]) 11/18/2017   "shaking episode" - no meds  . Sleep apnea    does not wear CPAP  . Splenomegaly 12/2015   Noted on CT  . Thoracic aorta atherosclerosis ([place]) 12/2017  . Thrombocytopenia ([place]) 06/29/2016  . Unspecified hypothyroidism 01/25/2013  . Vertigo     Past Surgical History:  Procedure Laterality Date  . CARDIAC CATHETERIZATION N/A 12/21/2015   Procedure: Right Heart Cath;  Surgeon: Jolaine Artist, MD;  Location: [facility];  Service: Cardiovascular;  Laterality: N/A;  . CARDIAC CATHETERIZATION N/A 04/15/2016   Procedure: Right/Left Heart Cath and Coronary Angiography;  Surgeon: Jolaine Artist, MD;  Location: [facility];  Service: Cardiovascular;  Laterality: N/A;  . COLONOSCOPY N/A 12/08/2013   Procedure: COLONOSCOPY;  Surgeon: Milus Banister, MD;  Location: [facility];  Service: Endoscopy;  Laterality: N/A;  . COLONOSCOPY WITH PROPOFOL N/A  03/24/2019   Procedure: COLONOSCOPY WITH PROPOFOL;  Surgeon: Milus Banister, MD;  Location: [facility];  Service: Endoscopy;  Laterality: N/A;  Draw CBC in preop  . COLONOSCOPY WITH PROPOFOL N/A 04/28/2019   Procedure: COLONOSCOPY WITH PROPOFOL;  Surgeon: Milus Banister, MD;  Location: [facility];  Service: Endoscopy;  Laterality: N/A;  . ESOPHAGOGASTRODUODENOSCOPY (EGD) WITH PROPOFOL N/A 03/27/2016   Procedure: ESOPHAGOGASTRODUODENOSCOPY (EGD) WITH PROPOFOL;  Surgeon: Milus Banister, MD;  Location: [facility];  Service: Endoscopy;  Laterality: N/A;  . ESOPHAGOGASTRODUODENOSCOPY (EGD) WITH PROPOFOL N/A 02/14/2019   Procedure: ESOPHAGOGASTRODUODENOSCOPY (EGD) WITH PROPOFOL;  Surgeon: Jerene Bears, MD;  Location: [facility] ENDOSCOPY;  Service: Gastroenterology;  Laterality: N/A;  . EYE SURGERY Bilateral 03/15/2019   [facility]  . EYE SURGERY Right 04/2020   [place], [place]  . GAS/FLUID EXCHANGE Right 09/22/2019   Procedure: GAS/FLUID EXCHANGE (C3F8) RIGHT EYE;  Surgeon: Sherlynn Stalls, MD;  Location: [facility];  Service: Ophthalmology;  Laterality: Right;  . HEMOSTASIS CLIP PLACEMENT  04/28/2019   Procedure: HEMOSTASIS CLIP PLACEMENT;  Surgeon: Milus Banister, MD;  Location: [facility];  Service: Endoscopy;;  . LOOP RECORDER INSERTION N/A 01/01/2017   Procedure: Loop Recorder Insertion;  Surgeon: Thompson Grayer, MD;  Location: [facility];  Service: Cardiovascular;  Laterality: N/A;  . PARS PLANA VITRECTOMY Right 09/22/2019   Procedure: PARS PLANA VITRECTOMY  WITH 25 GAUGE RIGHT EYE;  Surgeon: Sherlynn Stalls, MD;  Location: [facility];  Service: Ophthalmology;  Laterality: Right;  . PHOTOCOAGULATION WITH LASER Right 09/22/2019   Procedure: PHOTOCOAGULATION WITH LASER RIGHT EYE;  Surgeon: Sherlynn Stalls, MD;  Location: [facility];  Service: Ophthalmology;  Laterality: Right;  . POLYPECTOMY  04/28/2019   Procedure: POLYPECTOMY;  Surgeon: Milus Banister, MD;  Location: [facility];  Service: Endoscopy;;  .  RIGHT HEART CATHETERIZATION Right 06/06/2014   Procedure: RIGHT HEART CATH;  Surgeon: Jolaine Artist, MD;  Location: [facility];  Service: Cardiovascular;  Laterality: Right;  . UVULOPALATOPHARYNGOPLASTY  1999    Family History  Problem Relation Age of Onset  . Heart disease Mother   . Heart attack Mother   . Hypertension Mother   . Kidney failure Mother   . Liver disease Mother        stage 4 liver disease  . Prostate cancer Father   . Cancer Father        lung  . Hypertension Brother   . Gout Brother   . Alcoholism Maternal Grandfather   . Liver disease Maternal Grandfather   . Migraines Sister   . Hypertension Brother   . Colon cancer Neg Hx    Social History:  reports that he quit smoking about 40 years ago. His smoking use included cigarettes. He has a 10.00 pack-year smoking history. He has never used smokeless tobacco. He reports current alcohol use of about 6.0 standard drinks of alcohol per week. He reports that he does not use drugs.  Allergies:  Allergies  Allergen Reactions  . Aspirin Hypertension  . Nitroglycerin Other (See Comments)    Severe Headache    Medications Prior to Admission  Medication Sig Dispense Refill  . acetaminophen (TYLENOL) 500 MG tablet Take 1 tablet (500 mg total) by mouth every 6 (six) hours as needed (for pain.). 30 tablet 0  . ferrous gluconate (FERGON) 324 MG tablet Take 324 mg by mouth daily.    Marland Kitchen FLUoxetine (PROZAC) 20 MG tablet Take 0.5-1 tablets (10-20 mg total) by mouth daily. (Patient taking differently: Take 10 mg by mouth daily. ) 30 tablet 3  . folic acid (FOLVITE) 1 MG tablet Take 1 mg by mouth daily.    Marland Kitchen lactulose (CHRONULAC) 10 GM/15ML solution Take 30 mLs (20 g total) by mouth 2 (two) times daily. 300 mL 5  . macitentan (OPSUMIT) 10 MG tablet Take 1 tablet (10 mg total) by mouth daily. 90 tablet 3  . omeprazole (PRILOSEC) 40 MG capsule TAKE 1 CAPSULE BY MOUTH ONCE DAILY (Patient taking differently: Take 40 mg by mouth  daily. ) 90 capsule 0  . Potassium Chloride ER 20 MEQ TBCR Take 40 mEq by mouth 2 (two) times daily.    . prednisoLONE acetate (PRED FORTE) 1 % ophthalmic suspension Place 1 drop into both eyes See admin instructions. Instill 1 drop once daily in the right eye, instill 1 drop into the left eye 4 times daily    . rosuvastatin (CRESTOR) 5 MG tablet TAKE 1 TABLET DAILY (Patient taking differently: Take 5 mg by mouth daily. ) 90 tablet 3  . Selexipag (UPTRAVI) 1000 MCG TABS Take 1,000 mcg by mouth 2 (two) times daily. 60 tablet 11  . tadalafil, PAH, (ADCIRCA) 20 MG tablet Take 2 tablets (40 mg total) by mouth daily. 180 tablet 1  . thiamine 100 MG tablet Take 100 mg by mouth daily.    Marland Kitchen torsemide (DEMADEX)  20 MG tablet Take 3 tablets (60 mg total) by mouth 2 (two) times daily. 180 tablet 5  . ALPRAZolam (XANAX) 0.25 MG tablet TAKE 1 TABLET BY MOUTH TWICE DAILY AS NEEDED FOR ANXIETY OR SLEEP (Patient taking differently: Take 0.25 mg by mouth 2 (two) times daily as needed for anxiety or sleep. ) 20 tablet 0  . Blood Pressure Monitoring (BLOOD PRESSURE CUFF) MISC Use as directed. Check blood pressure bid prn Dx:I10 1 each 0  . Ferrous Fumarate (HEMOCYTE) 324 (106 Fe) MG TABS tablet Take 1 tablet (106 mg of iron total) by mouth daily. (Patient not taking: Reported on 05/28/2020) 30 tablet 5    Results for orders placed or performed during the hospital encounter of 05/31/20 (from the past 48 hour(s))  CBC per protocol     Status: Abnormal   Collection Time: 05/31/20  7:00 AM  Result Value Ref Range   WBC 2.3 (L) 4.0 - 10.5 K/uL   RBC 3.03 (L) 4.22 - 5.81 MIL/uL   Hemoglobin 9.7 (L) 13.0 - 17.0 g/dL   HCT 32.6 (L) 39 - 52 %   MCV 107.6 (H) 80.0 - 100.0 fL   MCH 32.0 26.0 - 34.0 pg   MCHC 29.8 (L) 30.0 - 36.0 g/dL   RDW 17.1 (H) 11.5 - 15.5 %   Platelets 21 (LL) 150 - 400 K/uL    Comment: REPEATED TO VERIFY PLATELET COUNT CONFIRMED BY SMEAR Immature Platelet Fraction may be clinically indicated,  consider ordering this additional test [id number] THIS CRITICAL RESULT HAS VERIFIED AND BEEN CALLED TO R HENLY RN BY BETH COLLINS ON 07 29 2021 AT 0800, AND HAS BEEN READ BACK.     nRBC 0.0 0.0 - 0.2 %    Comment: Performed at [facility], [place] [address] [place], [place] [address]  Comprehensive metabolic panel per protocol     Status: Abnormal   Collection Time: 05/31/20  7:00 AM  Result Value Ref Range   Sodium 137 135 - 145 mmol/L   Potassium 3.9 3.5 - 5.1 mmol/L   Chloride 108 98 - 111 mmol/L   CO2 19 (L) 22 - 32 mmol/L   Glucose, Bld 96 70 - 99 mg/dL    Comment: Glucose reference range applies only to samples taken after fasting for at least 8 hours.   BUN 32 (H) 8 - 23 mg/dL   Creatinine, Ser 2.72 (H) 0.61 - 1.24 mg/dL   Calcium 8.3 (L) 8.9 - 10.3 mg/dL   Total Protein 6.1 (L) 6.5 - 8.1 g/dL   Albumin 2.9 (L) 3.5 - 5.0 g/dL   AST 32 15 - 41 U/L   ALT 20 0 - 44 U/L   Alkaline Phosphatase 194 (H) 38 - 126 U/L   Total Bilirubin 1.9 (H) 0.3 - 1.2 mg/dL   GFR calc non Af Amer 23 (L) >60 mL/min   GFR calc Af Amer 26 (L) >60 mL/min   Anion gap 10 5 - 15    Comment: Performed at [facility] [address] [place], [place] [address]   *Note: Due to a large number of results and/or encounters for the requested time period, some results have not been displayed. A complete set of results can be found in Results Review.   No results found.  Review of Systems  Constitutional: Positive for fatigue.  Eyes: Positive for visual disturbance.  All other systems reviewed and are negative.   Blood pressure (!) 131/84, pulse 99, temperature  97.6 F (36.4 C), temperature source Temporal, resp. rate 20, height 6' 1" (1.854 m), weight 83 kg, SpO2 100 %. Physical Exam Vitals and nursing note reviewed.  Constitutional:      Appearance: Normal appearance. He is normal weight.  Eyes:   Neurological:     Mental Status: He is alert.      Assessment/Plan 1. Mac off RD:  PPV/MP/R/EC/EL/S Herschell Dimes, MD 05/31/2020, 9:32 AM

## 2020-05-31 NOTE — Anesthesia Procedure Notes (Signed)
Procedure Name: MAC Date/Time: 05/31/2020 9:47 AM Performed by: Colin Benton, CRNA Pre-anesthesia Checklist: Patient identified, Emergency Drugs available, Suction available and Patient being monitored Patient Re-evaluated:Patient Re-evaluated prior to induction Oxygen Delivery Method: Nasal cannula Induction Type: IV induction Placement Confirmation: positive ETCO2 Dental Injury: Teeth and Oropharynx as per pre-operative assessment

## 2020-05-31 NOTE — Anesthesia Postprocedure Evaluation (Signed)
Anesthesia Post Note  Patient: Darren Mueller  Procedure(s) Performed: PARS PLANA VITRECTOMY WITH 25 GAUGE (Left Eye) RETINECTOMY; INJECTION OF SILICONE OIL (Left Eye) PERFLUORONE INJECTION (Left Eye)     Patient location during evaluation: PACU Anesthesia Type: MAC Level of consciousness: awake and alert and oriented Pain management: pain level controlled Vital Signs Assessment: post-procedure vital signs reviewed and stable Respiratory status: spontaneous breathing, nonlabored ventilation and respiratory function stable Cardiovascular status: blood pressure returned to baseline Postop Assessment: no apparent nausea or vomiting Anesthetic complications: no   No complications documented.  Last Vitals:  Vitals:   05/31/20 1120 05/31/20 1135  BP: 112/75 105/67  Pulse: 75 72  Resp: 23 17  Temp: (!) 36.1 C (!) 36.2 C  SpO2: 99% 99%    Last Pain:  Vitals:   05/31/20 1135  TempSrc:   PainSc: 0-No pain                 Brennan Bailey

## 2020-05-31 NOTE — Brief Op Note (Signed)
05/31/2020  11:37 AM  PATIENT:  Darren Mueller  69 y.o. male  PRE-OPERATIVE DIAGNOSIS:  LEFT EYE RETINAL DETACHMENT  POST-OPERATIVE DIAGNOSIS:  LEFT EYE RETINAL DETACHMENT  PROCEDURE:  Procedure(s): PARS PLANA VITRECTOMY WITH 25 GAUGE (Left) RETINECTOMY; INJECTION OF SILICONE OIL (Left) PERFLUORONE INJECTION (Left)  SURGEON:  Surgeon(s) and Role:    Sherlynn Stalls, MD - Primary  PHYSICIAN ASSISTANT:   ASSISTANTS: none   ANESTHESIA:   MAC  EBL:  min   BLOOD ADMINISTERED:none  DRAINS: none   LOCAL MEDICATIONS USED:  BUPIVICAINE   SPECIMEN:  No Specimen  DISPOSITION OF SPECIMEN:  N/A  COUNTS:  Counts correct  TOURNIQUET:  * No tourniquets in log *  DICTATION: .Other Dictation: Dictation Number  [id number]  PLAN OF CARE: Admit for overnight observation  PATIENT DISPOSITION:  PACU - hemodynamically stable.   Delay start of Pharmacological VTE agent (>24hrs) due to surgical blood loss or risk of bleeding: not applicable

## 2020-05-31 NOTE — Progress Notes (Signed)
CRITICAL VALUE ALERT  Critical Value:  PLT 21  Date & Time Notied:  05/31/2020 0805  Provider Notified: Dr Sherlynn Stalls  Orders Received/Actions taken: MD aware of chronic low plt count and OK to proceed, no orders given.

## 2020-05-31 NOTE — Op Note (Signed)
NAME: Darren Mueller, [place]ILL[place] MEDICAL RECORD ZO:[id number] ACCOUNT [id number] DATE OF BIRTH:[birth year] FACILITY: [facility] LOCATION: [facility] PHYSICIAN:Edris Friedt Adele Dan, MD  OPERATIVE REPORT  DATE OF PROCEDURE:  05/31/2020  SURGEON:  Stephannie Li, MD   ANESTHESIA:  Local MAC, retrobulbar injection on the left eye.  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  POSTOPERATIVE DIAGNOSIS:  Chronic retinal detachment with proliferative vitreal retinopathy in the left eye.  PROCEDURE:   1.  A 25-gauge pars plana vitrectomy to repair complex retinal detachment with membrane peeling.   2.  Peripheral retinectomy.  3.  Perfluoron injection.  4.  Air-fluid exchange.  5.  Endolaser photocoagulation.  6.  Endocautery.  7.  Silicone oil injection of the left eye.    COMPLICATIONS:  None.  FINDINGS:  There was a total retinal detachment with complex PVR.  DESCRIPTION OF PROCEDURE:  The patient was identified in the preoperative holding area.  He was then taken to the operating room where he was sedated by the anesthesia team.  At that point in time, the left eye was anesthetized using a retrobulbar block  consisting of a 1:1 mixture of 0.75% bupivacaine, 1% lidocaine and 150 units of Hylenex.  After excellent akinesia and anesthesia was obtained, the left eye was prepped and draped in the usual fashion for ocular surgery.  A Lieberman speculum was placed  between the left upper and lower eyelids for exposure.  The 25-gauge trocars were used to introduce transconjunctival cannulas in the inferotemporal, superotemporal and superonasal quadrants.  The trocars were removed, leaving the cannulas in place.  An  infusion cannula was then placed in the inferotemporal location and confirmed to be in the vitreous cavity prior to its use.  Once this was completed, the eye underwent a core vitrectomy with the vitreous cutter and light pipe.  Vitreous  was trimmed out  to the periphery.  Numerous retinal breaks were identified in the left eye at 11 o'clock through 1 o'clock.  Additional breaks were found between 5 and 6 o'clock and also at 9 o'clock.  These retinal breaks were marked using endocautery. Once the retinal  breaks were identified, they were marked using endocautery.  Next, a pair of Endo grasping forceps were used to grasp and peel star folds in the posterior pole.  This was performed without too much difficulty.  Membranes were peeled as much as possible.   The retina did have a very thick and stiff feeling to it.  Therefore, the decision was made to make a peripheral retinectomy.  The goal with the surgery was to salvage any type of vision whatsoever.  So therefore endocautery was used to delineate a  large anterior peripheral retinectomy border.  This was performed without difficulty.  Retinectomy was delineated from approximately 10 o'clock clockwise over to about 8 o'clock.  Next, the vitrector was used to remove the necrotic contracted retina  anterior to the retinectomy.  This released the retinal traction significantly and allowed for mobility of the retina.  A double bore cannula was then used to place perfluoron in the posterior pole to flatten the retina.  This was successful and allowed  the retina to flatten nicely on the posterior pole.  The vitrector was then used to create a small peripheral iridectomy at approximately 5 o'clock.  The vitrector was removed again and endolaser photocoagulation was placed in a confluent manner around  the posterior margin of the retinectomy.  Once this was completed, a  soft tip extrusion cannula was used to perform an air-fluid exchange to remove first the small amount of fluid in the eye and then the perfluoron while filling the eye with intraocular  air.  There was no retinal slippage at the time.  Next, the eye was filled with 1000 centistoke silicone oil using the viscous fluid injector.  Oil  was brought up to the lens iris diaphragm.  At that point in time,  intraocular pressure was assessed and  found to be physiologic.  Therefore, cannulas were removed from the sclerotomies.  Sclerotomies were inspected and found to be oil tight.  The eye was then treated with subconjunctival injections of 15 mg of Ancef to 1 mg of dexamethasone.  The eye was  treated topically with one drop of 1% atropine and TobraDex ointment.  The speculum was removed.  The eyelids were cleaned and closed.  They were then patched and shielded.  The patient was then taken to recovery in excellent condition, having tolerated  the procedure very well.  VN/NUANCE  D:05/31/2020 T:05/31/2020 JOB:[id number]

## 2020-05-31 NOTE — Transfer of Care (Signed)
Immediate Anesthesia Transfer of Care Note  Patient: Darren Mueller  Procedure(s) Performed: PARS PLANA VITRECTOMY WITH 25 GAUGE (Left Eye) RETINECTOMY; INJECTION OF SILICONE OIL (Left Eye) PERFLUORONE INJECTION (Left Eye)  Patient Location: PACU  Anesthesia Type:MAC  Level of Consciousness: awake, alert , oriented and patient cooperative  Airway & Oxygen Therapy: Patient Spontanous Breathing  Post-op Assessment: Report given to RN and Post -op Vital signs reviewed and stable  Post vital signs: Reviewed and stable  Last Vitals:  Vitals Value Taken Time  BP 112/75 05/31/20 1120  Temp    Pulse 74 05/31/20 1121  Resp 28 05/31/20 1121  SpO2 99 % 05/31/20 1121  Vitals shown include unvalidated device data.  Last Pain:  Vitals:   05/31/20 0706  TempSrc:   PainSc: 0-No pain         Complications: No complications documented.

## 2020-06-01 ENCOUNTER — Encounter (HOSPITAL_COMMUNITY): Payer: Self-pay | Admitting: Ophthalmology

## 2020-06-01 ENCOUNTER — Telehealth: Payer: Self-pay

## 2020-06-01 NOTE — Telephone Encounter (Signed)
Patients son [name] returned SW call. Son shared that patient has been doing well and had eye surgery on yesterday and has post-op today. Son open to follow up palliative care phone call in the next few weeks.

## 2020-06-08 DIAGNOSIS — H33052 Total retinal detachment, left eye: Secondary | ICD-10-CM | POA: Diagnosis not present

## 2020-06-09 DIAGNOSIS — I200 Unstable angina: Secondary | ICD-10-CM | POA: Diagnosis not present

## 2020-06-09 DIAGNOSIS — I5032 Chronic diastolic (congestive) heart failure: Secondary | ICD-10-CM | POA: Diagnosis not present

## 2020-06-09 DIAGNOSIS — R9431 Abnormal electrocardiogram [ECG] [EKG]: Secondary | ICD-10-CM | POA: Diagnosis not present

## 2020-06-11 ENCOUNTER — Encounter (HOSPITAL_COMMUNITY): Payer: Self-pay

## 2020-06-11 ENCOUNTER — Inpatient Hospital Stay: Payer: Medicare Other

## 2020-06-11 ENCOUNTER — Inpatient Hospital Stay: Payer: Medicare Other | Admitting: Family

## 2020-06-11 ENCOUNTER — Other Ambulatory Visit: Payer: Self-pay

## 2020-06-12 ENCOUNTER — Encounter: Payer: Self-pay | Admitting: Physician Assistant

## 2020-06-12 ENCOUNTER — Encounter: Payer: Self-pay | Admitting: Family Medicine

## 2020-06-12 ENCOUNTER — Ambulatory Visit (INDEPENDENT_AMBULATORY_CARE_PROVIDER_SITE_OTHER): Payer: Medicare Other | Admitting: Physician Assistant

## 2020-06-12 VITALS — BP 90/66 | HR 80 | Wt 186.0 lb

## 2020-06-12 DIAGNOSIS — K7031 Alcoholic cirrhosis of liver with ascites: Secondary | ICD-10-CM

## 2020-06-12 DIAGNOSIS — N183 Chronic kidney disease, stage 3 unspecified: Secondary | ICD-10-CM | POA: Diagnosis not present

## 2020-06-12 DIAGNOSIS — R197 Diarrhea, unspecified: Secondary | ICD-10-CM | POA: Diagnosis not present

## 2020-06-12 DIAGNOSIS — I509 Heart failure, unspecified: Secondary | ICD-10-CM | POA: Diagnosis not present

## 2020-06-12 LAB — BASIC METABOLIC PANEL
BUN: 46 — AB (ref 4–21)
CO2: 18 (ref 13–22)
Chloride: 109 — AB (ref 99–108)
Creatinine: 3.4 — AB (ref 0.6–1.3)
Glucose: 112
Potassium: 4.7 (ref 3.4–5.3)
Sodium: 139 (ref 137–147)

## 2020-06-12 LAB — CBC AND DIFFERENTIAL
HCT: 31 — AB (ref 41–53)
Hemoglobin: 10.6 — AB (ref 13.5–17.5)
Neutrophils Absolute: 2
Platelets: 35 — AB (ref 150–399)
WBC: 2.9

## 2020-06-12 LAB — HEPATIC FUNCTION PANEL
ALT: 18 (ref 10–40)
AST: 31 (ref 14–40)
Alkaline Phosphatase: 219 — AB (ref 25–125)
Bilirubin, Total: 1.9

## 2020-06-12 LAB — COMPREHENSIVE METABOLIC PANEL
Albumin: 3.5 (ref 3.5–5.0)
Calcium: 8.7 (ref 8.7–10.7)
GFR calc Af Amer: 20
GFR calc non Af Amer: 18
Globulin: 2.7

## 2020-06-12 LAB — CBC: RBC: 3.22 — AB (ref 3.87–5.11)

## 2020-06-12 NOTE — Patient Instructions (Addendum)
Continue Lactulose at current dose.  You have been scheduled for an abdominal paracentesis at [facility] radiology (1st floor of hospital) on 06/24/2020 at 10am. Please arrive at least 15 minutes prior to your appointment time for registration. Should you need to reschedule this appointment for any reason, please call our office at [phone].  Follow up with Dr. Ardis Hughs in 6 months.

## 2020-06-12 NOTE — Progress Notes (Signed)
Chief Complaint: Diarrhea, alcoholic cirrhosis  HPI:    [name] is a 69 year old male with a past medical history of alcoholic cirrhosis, CHF with RV failure (echo 3/21 with EF 60-65%), CKD stage III, COPD, CAD, GERD and multiple others, known to Dr. Ardis Hughs, who was referred to me by Mosie Lukes, MD for a complaint of diarrhea and alcoholic cirrhosis.      04/28/2019 colonoscopy with 2 sessile polyps in the cecum, 3-6 mm in size, third larger polyp in the cecum pedunculated 19 mm polyp removed and clipped.  Polyps were adenomatous in recall was placed in 3 years.    04/26/2020 patient was seen by cardiology for his heart failure.  He was continued on torsemide 60 mg twice daily.  His recurrent syncope/seizures were discussed and he had interrogations of his pacemaker which showed no arrhythmias and thus the leads were felt to be seizures.  Continued on Keppra.  He has known pancytopenia and is followed by Dr. Alen Blew and hematology.    Today, the patient presents to clinic accompanied by his daughter who does assist with his care. They explain the patient has had some trouble with his fluid. The daughter tells me this is because he is not watching what he eats and does not really care about salt intake until she comes home. Typically when she comes back they are able to get this under control, but in the interim he eats [place] and what ever else he would like. Most recently the patient tells me his abdomen does feel tense/tight and he would like to "get the fluid off". They are continuing to work with cardiology in regulating his Torsemide.    Patient also tells me today that he is having loose stools about 3-4 times a day with using his Lactulose 30 mL/day. Tells me this is typically after meals. No nighttime awakenings.    Denies fever, chills, change in bowel habits, blood in his stool or symptoms that awaken him from sleep.  Past Medical History:  Diagnosis Date  . Alcohol dependence in  remission ([place]) 03/22/2011   In remission   . Alcoholic cirrhosis of liver with ascites ([place]) 2014  . Anemia   . Anginal pain ([place])    with SOB admitted 04/20/2019  . Anxiety   . Cardiomegaly 02/2019   Noted on CXR  . CHF (congestive heart failure) ([place])   . CKD (chronic kidney disease), stage III 07/18/2013  . COPD (chronic obstructive pulmonary disease) ([place])   . Coronary artery disease   . GERD (gastroesophageal reflux disease)   . Gout   . Hearing loss    no hearing aids  . Hypertension   . Hypertriglyceridemia 03/17/2017  . PAH (pulmonary artery hypertension) ([facility])    RV failure  . Pancytopenia ([place])   . Pleural effusion 2014   Noted on CT:   Moderately large simple layering right pleural effusion   . Pulmonary nodule 2014   Noted on CT: An 8 mm pulmonary nodule in the periphery of the left lower lobe  . Rectal bleeding   . Seizures ([place]) 11/18/2017   "shaking episode" - no meds  . Sleep apnea    does not wear CPAP  . Splenomegaly 12/2015   Noted on CT  . Thoracic aorta atherosclerosis ([place]) 12/2017  . Thrombocytopenia ([place]) 06/29/2016  . Unspecified hypothyroidism 01/25/2013  . Vertigo     Past Surgical History:  Procedure Laterality Date  . CARDIAC CATHETERIZATION N/A 12/21/2015  Procedure: Right Heart Cath;  Surgeon: Jolaine Artist, MD;  Location: [facility];  Service: Cardiovascular;  Laterality: N/A;  . CARDIAC CATHETERIZATION N/A 04/15/2016   Procedure: Right/Left Heart Cath and Coronary Angiography;  Surgeon: Jolaine Artist, MD;  Location: [facility];  Service: Cardiovascular;  Laterality: N/A;  . COLONOSCOPY N/A 12/08/2013   Procedure: COLONOSCOPY;  Surgeon: Milus Banister, MD;  Location: [facility];  Service: Endoscopy;  Laterality: N/A;  . COLONOSCOPY WITH PROPOFOL N/A 03/24/2019   Procedure: COLONOSCOPY WITH PROPOFOL;  Surgeon: Milus Banister, MD;  Location: [facility];  Service: Endoscopy;  Laterality: N/A;  Draw CBC in preop  .  COLONOSCOPY WITH PROPOFOL N/A 04/28/2019   Procedure: COLONOSCOPY WITH PROPOFOL;  Surgeon: Milus Banister, MD;  Location: [facility];  Service: Endoscopy;  Laterality: N/A;  . ESOPHAGOGASTRODUODENOSCOPY (EGD) WITH PROPOFOL N/A 03/27/2016   Procedure: ESOPHAGOGASTRODUODENOSCOPY (EGD) WITH PROPOFOL;  Surgeon: Milus Banister, MD;  Location: [facility];  Service: Endoscopy;  Laterality: N/A;  . ESOPHAGOGASTRODUODENOSCOPY (EGD) WITH PROPOFOL N/A 02/14/2019   Procedure: ESOPHAGOGASTRODUODENOSCOPY (EGD) WITH PROPOFOL;  Surgeon: Jerene Bears, MD;  Location: [facility];  Service: Gastroenterology;  Laterality: N/A;  . EYE SURGERY Bilateral 03/15/2019   [place]  . EYE SURGERY Right 04/2020   [place], [place]  . GAS/FLUID EXCHANGE Right 09/22/2019   Procedure: GAS/FLUID EXCHANGE (C3F8) RIGHT EYE;  Surgeon: Sherlynn Stalls, MD;  Location: [facility];  Service: Ophthalmology;  Laterality: Right;  . HEMOSTASIS CLIP PLACEMENT  04/28/2019   Procedure: HEMOSTASIS CLIP PLACEMENT;  Surgeon: Milus Banister, MD;  [facility]: [facility];  Service: Endoscopy;;  . INJECTION OF SILICONE OIL Left 9/67/8938   Procedure: RETINECTOMY; INJECTION OF SILICONE OIL;  Surgeon: Sherlynn Stalls, MD;  Location: [facility];  Service: Ophthalmology;  Laterality: Left;  . LOOP RECORDER INSERTION N/A 01/01/2017   Procedure: Loop Recorder Insertion;  Surgeon: Thompson Grayer, MD;  Location: [facility];  Service: Cardiovascular;  Laterality: N/A;  . PARS PLANA VITRECTOMY Right 09/22/2019   Procedure: PARS PLANA VITRECTOMY WITH 25 GAUGE RIGHT EYE;  Surgeon: Sherlynn Stalls, MD;  Location: [facility];  Service: Ophthalmology;  Laterality: Right;  . PARS PLANA VITRECTOMY Left 05/31/2020   Procedure: PARS PLANA VITRECTOMY WITH 25 GAUGE;  Surgeon: Sherlynn Stalls, MD;  Location: [facility];  Service: Ophthalmology;  Laterality: Left;  . [place] INJECTION Left 05/31/2020   Procedure: PERFLUORONE INJECTION;  Surgeon: Sherlynn Stalls, MD;  Location: [facility];   Service: Ophthalmology;  Laterality: Left;  . PHOTOCOAGULATION WITH LASER Right 09/22/2019   Procedure: PHOTOCOAGULATION WITH LASER RIGHT EYE;  Surgeon: Sherlynn Stalls, MD;  Location: [facility];  Service: Ophthalmology;  Laterality: Right;  . POLYPECTOMY  04/28/2019   Procedure: POLYPECTOMY;  Surgeon: Milus Banister, MD;  Location: [facility];  Service: Endoscopy;;  . RIGHT HEART CATHETERIZATION Right 06/06/2014   Procedure: RIGHT HEART CATH;  Surgeon: Jolaine Artist, MD;  Location: [facility];  Service: Cardiovascular;  Laterality: Right;  . UVULOPALATOPHARYNGOPLASTY  1999    Current Outpatient Medications  Medication Sig Dispense Refill  . acetaminophen (TYLENOL) 500 MG tablet Take 1 tablet (500 mg total) by mouth every 6 (six) hours as needed (for pain.). 30 tablet 0  . ALPRAZolam (XANAX) 0.25 MG tablet TAKE 1 TABLET BY MOUTH TWICE DAILY AS NEEDED FOR ANXIETY OR SLEEP (Patient taking differently: Take 0.25 mg by mouth 2 (two) times daily as needed for anxiety or sleep. ) 20 tablet 0  .  Blood Pressure Monitoring (BLOOD PRESSURE CUFF) MISC Use as directed. Check blood pressure bid prn Dx:I10 1 each 0  . Ferrous Fumarate (HEMOCYTE) 324 (106 Fe) MG TABS tablet Take 1 tablet (106 mg of iron total) by mouth daily. 30 tablet 5  . ferrous gluconate (FERGON) 324 MG tablet Take 324 mg by mouth daily.    Marland Kitchen FLUoxetine (PROZAC) 20 MG tablet Take 0.5-1 tablets (10-20 mg total) by mouth daily. (Patient taking differently: Take 10 mg by mouth daily. ) 30 tablet 3  . folic acid (FOLVITE) 1 MG tablet Take 1 mg by mouth daily.    . macitentan (OPSUMIT) 10 MG tablet Take 1 tablet (10 mg total) by mouth daily. 90 tablet 3  . omeprazole (PRILOSEC) 40 MG capsule TAKE 1 CAPSULE BY MOUTH ONCE DAILY (Patient taking differently: Take 40 mg by mouth daily. ) 90 capsule 0  . Potassium Chloride ER 20 MEQ TBCR Take 40 mEq by mouth 2 (two) times daily.    . prednisoLONE acetate (PRED FORTE) 1 % ophthalmic suspension  Place 1 drop into both eyes See admin instructions. Instill 1 drop once daily in the right eye, instill 1 drop into the left eye 4 times daily    . rosuvastatin (CRESTOR) 5 MG tablet TAKE 1 TABLET DAILY (Patient taking differently: Take 5 mg by mouth daily. ) 90 tablet 3  . Selexipag (UPTRAVI) 1000 MCG TABS Take 1,000 mcg by mouth 2 (two) times daily. 60 tablet 11  . tadalafil, PAH, (ADCIRCA) 20 MG tablet Take 2 tablets (40 mg total) by mouth daily. 180 tablet 1  . thiamine 100 MG tablet Take 100 mg by mouth daily.    Marland Kitchen torsemide (DEMADEX) 20 MG tablet Take 3 tablets (60 mg total) by mouth 2 (two) times daily. 180 tablet 5  . lactulose (CHRONULAC) 10 GM/15ML solution Take 30 mLs (20 g total) by mouth 2 (two) times daily. (Patient not taking: Reported on 06/12/2020) 300 mL 5   No current facility-administered medications for this visit.    Allergies as of 06/12/2020 - Review Complete 06/12/2020  Allergen Reaction Noted  . Aspirin Hypertension 03/21/2011  . Nitroglycerin Other (See Comments) 05/07/2017    Family History  Problem Relation Age of Onset  . Heart disease Mother   . Heart attack Mother   . Hypertension Mother   . Kidney failure Mother   . Liver disease Mother        stage 4 liver disease  . Prostate cancer Father   . Cancer Father        lung  . Hypertension Brother   . Gout Brother   . Alcoholism Maternal Grandfather   . Liver disease Maternal Grandfather   . Migraines Sister   . Hypertension Brother   . Colon cancer Neg Hx     Social History   Socioeconomic History  . Marital status: Divorced    Spouse name: Not on file  . Number of children: 3  . Years of education: Not on file  . Highest education level: Not on file  Occupational History  . Occupation: Retired    Comment: Social research officer, government  Tobacco Use  . Smoking status: Former Smoker    Packs/day: 2.00    Years: 5.00    Pack years: 10.00    Types: Cigarettes    Quit date: 09/22/1979    Years since  quitting: 40.7  . Smokeless tobacco: Never Used  Vaping Use  . Vaping Use: Never used  Substance  and Sexual Activity  . Alcohol use: Yes    Alcohol/week: 6.0 standard drinks    Types: 6 Shots of liquor per week    Comment: quit drinking in 12/2019  . Drug use: No  . Sexual activity: Yes    Birth control/protection: Spermicide  Other Topics Concern  . Not on file  Social History Narrative   0 caffeine drinks daily    Social Determinants of Health   Financial Resource Strain: Low Risk   . Difficulty of Paying Living Expenses: Not hard at all  Food Insecurity: No Food Insecurity  . Worried About Charity fundraiser in the Last Year: Never true  . Ran Out of Food in the Last Year: Never true  Transportation Needs: No Transportation Needs  . Lack of Transportation (Medical): No  . Lack of Transportation (Non-Medical): No  Physical Activity:   . Days of Exercise per Week:   . Minutes of Exercise per Session:   Stress:   . Feeling of Stress :   Social Connections:   . Frequency of Communication with Friends and Family:   . Frequency of Social Gatherings with Friends and Family:   . Attends Religious Services:   . Active Member of Clubs or Organizations:   . Attends Archivist Meetings:   Marland Kitchen Marital Status:   Intimate Partner Violence:   . Fear of Current or Ex-Partner:   . Emotionally Abused:   Marland Kitchen Physically Abused:   . Sexually Abused:     Review of Systems:    Constitutional: No weight loss, fever or chills Skin: No rash Cardiovascular: No chest pain Respiratory: No SOB Gastrointestinal: See HPI and otherwise negative Genitourinary: No dysuria  Neurological: No headache, dizziness or syncope Musculoskeletal: No new muscle or joint pain Hematologic: No bleeding  Psychiatric: No history of depression or anxiety   Physical Exam:  Vital signs: BP 90/66 (BP Location: Left Arm, Patient Position: Sitting, Cuff Size: Normal)   Pulse 80   Wt 186 lb (84.4 kg)    BMI 24.54 kg/m   Constitutional:   Pleasant AA male appears to be in NAD, Well developed, Well nourished, alert and cooperative Head:  Normocephalic and atraumatic. Eyes:   PEERL, EOMI. No icterus. Conjunctiva pink. Ears:  Normal auditory acuity. Neck:  Supple Throat: Oral cavity and pharynx without inflammation, swelling or lesion.  Respiratory: Respirations even and unlabored. Lungs clear to auscultation bilaterally.   No wheezes, crackles, or rhonchi.  Cardiovascular: Normal S1, S2. No MRG. Regular rate and rhythm. No peripheral edema, cyanosis or pallor.  Gastrointestinal:  Soft, moderate distension, nontender. No rebound or guarding. Normal bowel sounds. No appreciable masses or hepatomegaly. Rectal:  Not performed.  Msk:  Symmetrical without gross deformities. Without edema, no deformity or joint abnormality.  Neurologic:  Alert and  oriented x4;  grossly normal neurologically.  Skin:   Dry and intact without significant lesions or rashes. Psychiatric: Demonstrates good judgement and reason without abnormal affect or behaviors.  RELEVANT LABS AND IMAGING: CBC    Component Value Date/Time   WBC 2.3 (L) 05/31/2020 0700   RBC 3.03 (L) 05/31/2020 0700   HGB 9.7 (L) 05/31/2020 0700   HGB 9.7 (L) 12/12/2019 1059   HGB 12.7 (L) 04/21/2017 0833   HCT 32.6 (L) 05/31/2020 0700   HCT 37.4 (L) 04/21/2017 0833   PLT 21 (LL) 05/31/2020 0700   PLT 31 (L) 12/12/2019 1059   PLT 39 (L) 04/21/2017 0833   MCV 107.6 (H) 05/31/2020  0700   MCV 106.4 (H) 04/21/2017 0833   MCH 32.0 05/31/2020 0700   MCHC 29.8 (L) 05/31/2020 0700   RDW 17.1 (H) 05/31/2020 0700   RDW 14.7 (H) 04/21/2017 0833   LYMPHSABS 0.8 02/28/2020 1647   LYMPHSABS 0.6 (L) 04/21/2017 0833   MONOABS 0.5 02/28/2020 1647   MONOABS 0.2 04/21/2017 0833   EOSABS 0.0 02/28/2020 1647   EOSABS 0.0 04/21/2017 0833   EOSABS 0.0 06/15/2015 1408   BASOSABS 0.0 02/28/2020 1647   BASOSABS 0.0 04/21/2017 0833    CMP     Component  Value Date/Time   NA 137 05/31/2020 0700   NA 140 06/29/2019 0924   NA 140 06/15/2015 1414   K 3.9 05/31/2020 0700   K 3.6 06/15/2015 1414   CL 108 05/31/2020 0700   CL 106 06/15/2015 1414   CO2 19 (L) 05/31/2020 0700   CO2 17 (L) 06/15/2015 1414   GLUCOSE 96 05/31/2020 0700   GLUCOSE 100 06/15/2015 1414   BUN 32 (H) 05/31/2020 0700   BUN 33 (H) 06/29/2019 0924   BUN 18 06/15/2015 1414   CREATININE 2.72 (H) 05/31/2020 0700   CREATININE 3.21 (HH) 12/12/2019 1059   CREATININE 1.63 (H) 09/17/2017 1614   CALCIUM 8.3 (L) 05/31/2020 0700   CALCIUM 9.5 06/15/2015 1414   PROT 6.1 (L) 05/31/2020 0700   PROT 8.1 06/15/2015 1414   ALBUMIN 2.9 (L) 05/31/2020 0700   ALBUMIN 4.0 06/15/2015 1414   AST 32 05/31/2020 0700   AST 34 12/12/2019 1059   ALT 20 05/31/2020 0700   ALT 8 12/12/2019 1059   ALT 35 06/15/2015 1414   ALKPHOS 194 (H) 05/31/2020 0700   ALKPHOS 145 (H) 06/15/2015 1414   BILITOT 1.9 (H) 05/31/2020 0700   BILITOT 1.8 (H) 12/12/2019 1059   GFRNONAA 23 (L) 05/31/2020 0700   GFRNONAA 19 (L) 12/12/2019 1059   GFRAA 26 (L) 05/31/2020 0700   GFRAA 22 (L) 12/12/2019 1059    Assessment: 1. Alcoholic cirrhosis with ascites: Patient is doing fairly well but having trouble with ascites, cardiology are monitoring his fluid as well and adjusting torsemide, he sees them again at the beginning of September 2. Diarrhea: this is only 3-4 loose stools a day with patient's use of Lactulose; explained this is expected  Plan: 1. Scheduled an ultrasound-guided paracentesis today for fluid removal. We will send for cytology, cell count and differential, Gram stain, protein 2. Continue Torsemide at current dose for now. 3. Had a long discussion about a decreased sodium diet. Explained that sodium levels are what can hold onto fluid for him. He verbalized understanding. 4. Patient to follow in clinic with Dr. Ardis Hughs in 6 months for recheck or sooner if necessary.  Ellouise Newer,  PA-C [facility] 06/12/2020, 8:51 AM  Cc: Mosie Lukes, MD

## 2020-06-13 DIAGNOSIS — R4020 Unspecified coma: Secondary | ICD-10-CM | POA: Diagnosis not present

## 2020-06-14 ENCOUNTER — Ambulatory Visit (HOSPITAL_COMMUNITY): Payer: Medicare Other

## 2020-06-15 ENCOUNTER — Ambulatory Visit (HOSPITAL_COMMUNITY): Payer: Medicare Other

## 2020-06-15 ENCOUNTER — Telehealth: Payer: Self-pay | Admitting: Family Medicine

## 2020-06-19 ENCOUNTER — Ambulatory Visit: Payer: Self-pay | Admitting: *Deleted

## 2020-06-19 ENCOUNTER — Other Ambulatory Visit: Payer: Self-pay | Admitting: *Deleted

## 2020-06-19 NOTE — Patient Outreach (Signed)
[facility]) Care Management  06/19/2020  Darren Mueller Jul 15, 1951 [id number]   Pt has expired.  [name], RN Care Management Coordinator [place] Office [phone]

## 2020-06-19 NOTE — Progress Notes (Signed)
I agree with the above note, plan 

## 2020-06-21 ENCOUNTER — Other Ambulatory Visit (HOSPITAL_COMMUNITY): Payer: Self-pay | Admitting: Internal Medicine

## 2020-06-26 ENCOUNTER — Encounter: Payer: Self-pay | Admitting: *Deleted

## 2020-07-04 DIAGNOSIS — 419620001 Death: Secondary | SNOMED CT | POA: Diagnosis not present

## 2020-07-04 NOTE — Telephone Encounter (Signed)
[facility]eral [employer] dropped Death Certificate to be filled out by provider. Document given to cma covering for the day. Darren Mueller). [facility] is wanting to be called when document ready at [phone].

## 2020-07-04 NOTE — Telephone Encounter (Signed)
Covering provider Dr. Nani Ravens for PCP of patient did sign death certificate. Called the funeral and they will pickup on Monday. Put at the front desk.

## 2020-07-04 DEATH — deceased

## 2020-07-05 ENCOUNTER — Encounter (HOSPITAL_COMMUNITY): Payer: Medicare Other | Admitting: Internal Medicine

## 2020-07-11 ENCOUNTER — Encounter: Payer: Medicare Other | Admitting: Internal Medicine

## 2020-10-04 ENCOUNTER — Ambulatory Visit: Payer: Medicare Other | Admitting: Oncology

## 2020-10-04 ENCOUNTER — Other Ambulatory Visit: Payer: Medicare Other

## 2021-03-22 ENCOUNTER — Ambulatory Visit: Payer: Medicare Other | Admitting: *Deleted

## 2021-04-22 IMAGING — CT CT HEAD W/O CM
3 series · 16 of 47 positions shown, 19 images · non-contrast
Comparison: Brain MRI 05/25/2018

CLINICAL DATA: Acute headache with normal neuro exam

EXAM:
CT HEAD WITHOUT CONTRAST
TECHNIQUE: Contiguous axial images were obtained from the base of the skull
through the vertex without intravenous contrast.

[Series 2: head wo · axial · 0.44mm/px · z∈[+696,+836]mm · 10 of 34 slices shown, 13 images]
[im 3/34  brain]
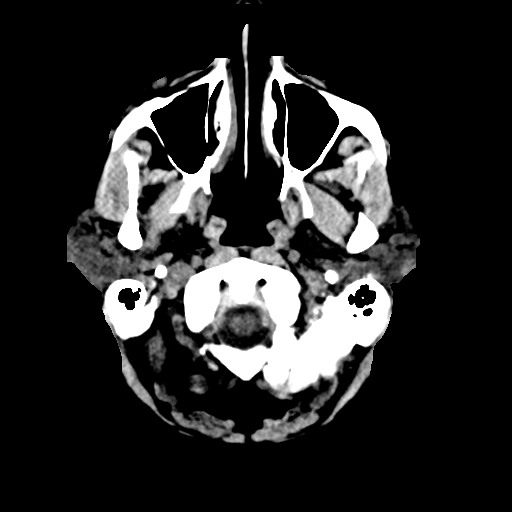
[im 3/34  bone]
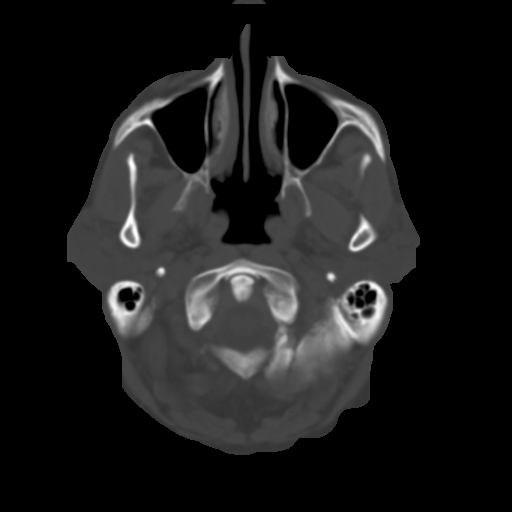
[im 6/34  brain]
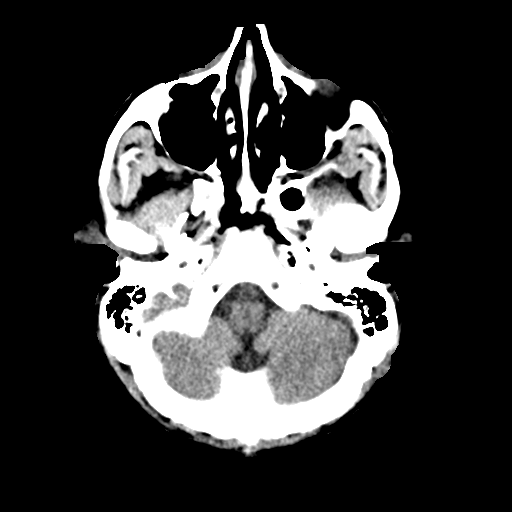
[im 10/34  brain]
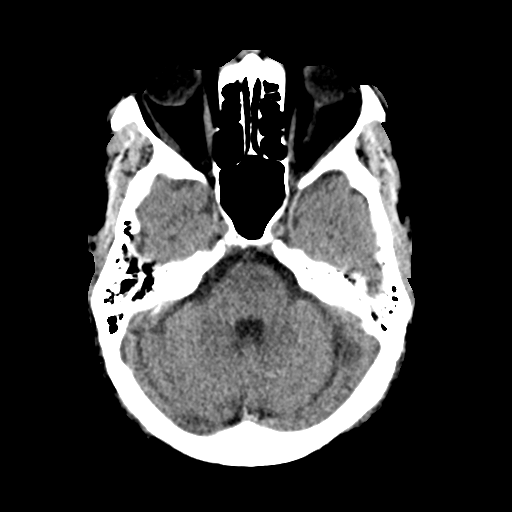
[im 12/34  brain]
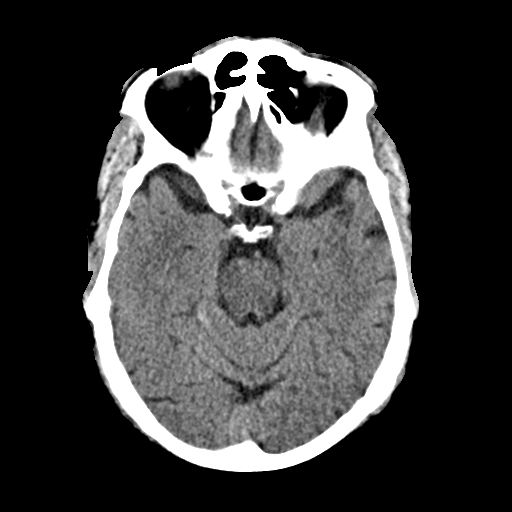
[im 15/34  brain]
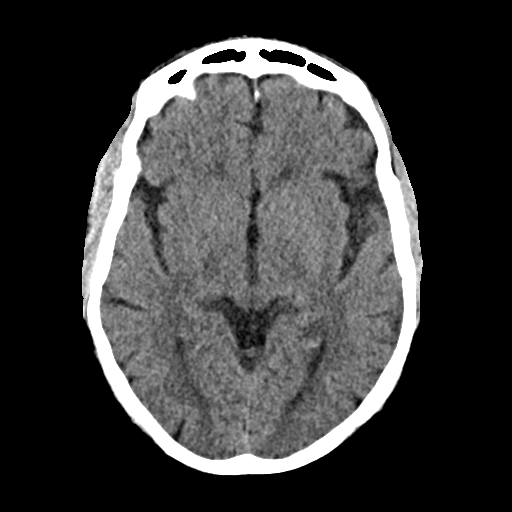
[im 15/34  bone]
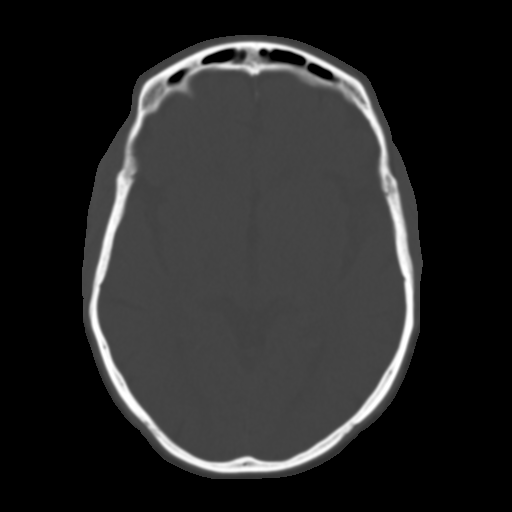
[im 19/34  brain]
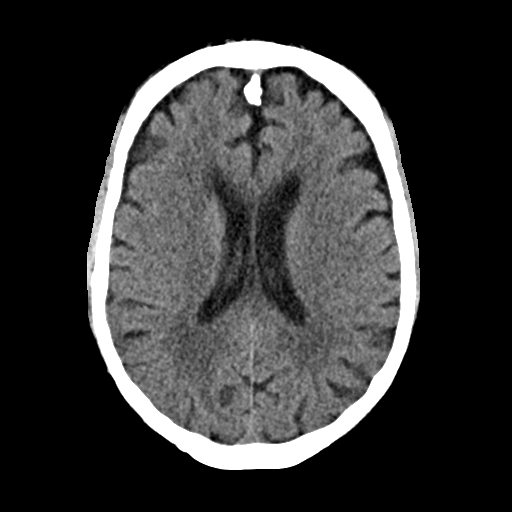
[im 22/34  brain]
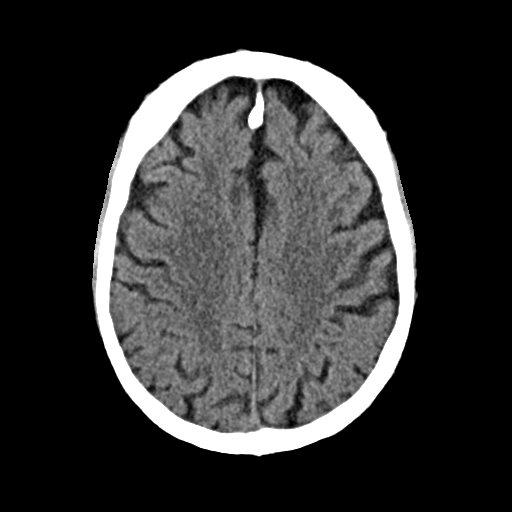
[im 26/34  brain]
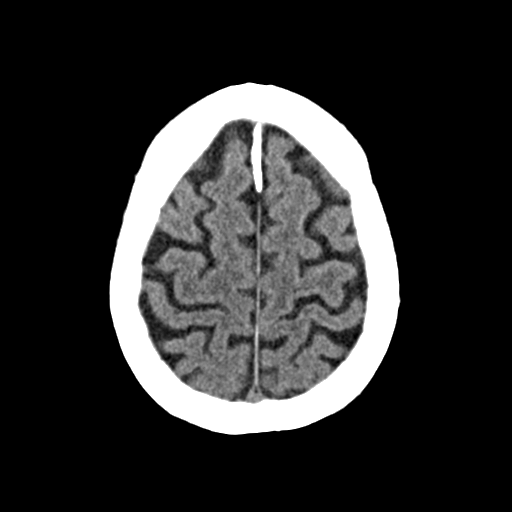
[im 28/34  brain]
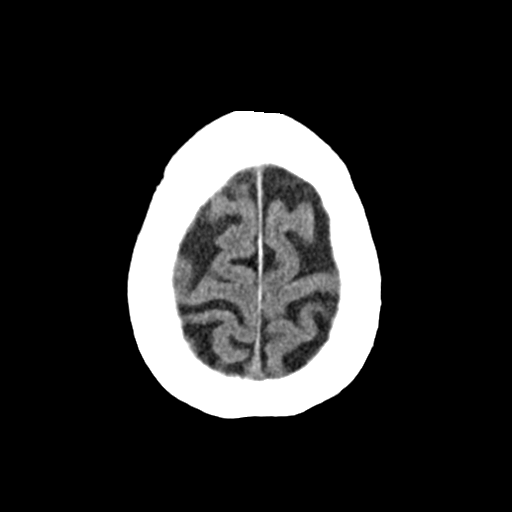
[im 28/34  bone]
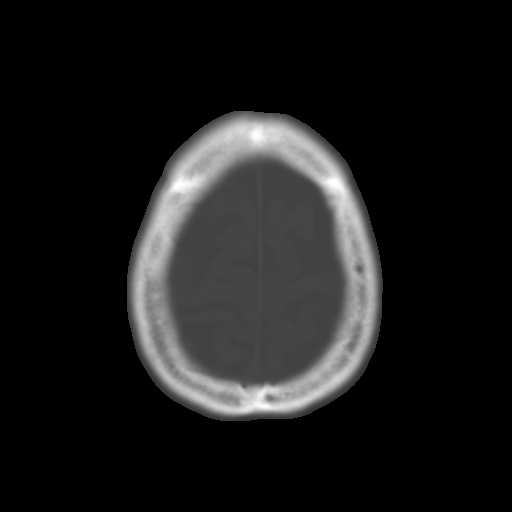
[im 31/34  brain]
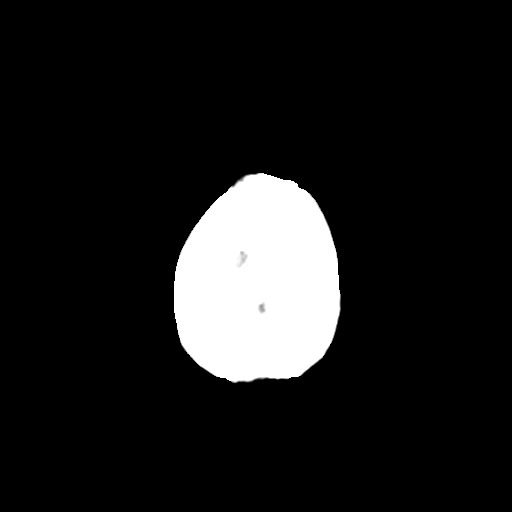

[Series 4: coronal soft · coronal · 0.34mm/px · 3 of 72 slices shown]
[im 24/72  brain]
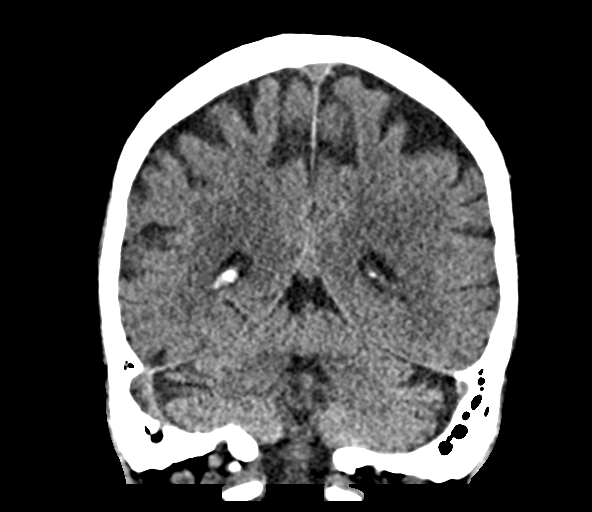
[im 32/72  brain]
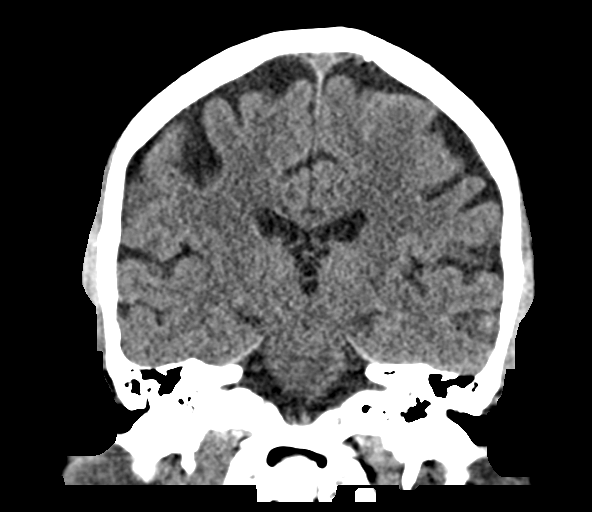
[im 40/72  brain]
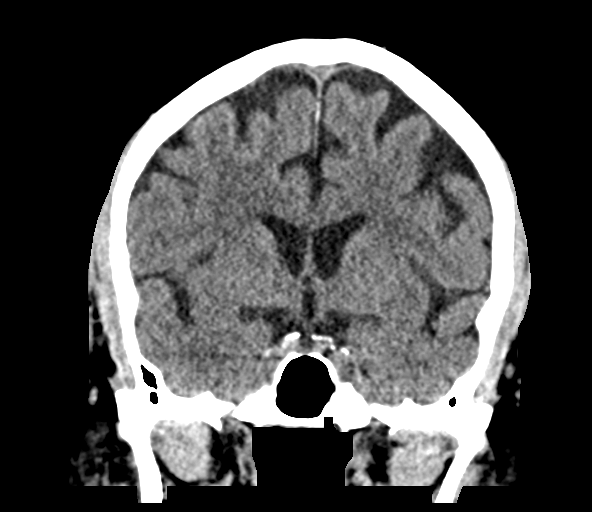

[Series 5: sag soft · sagittal · 0.34mm/px · 3 of 57 slices shown]
[im 19/57  brain]
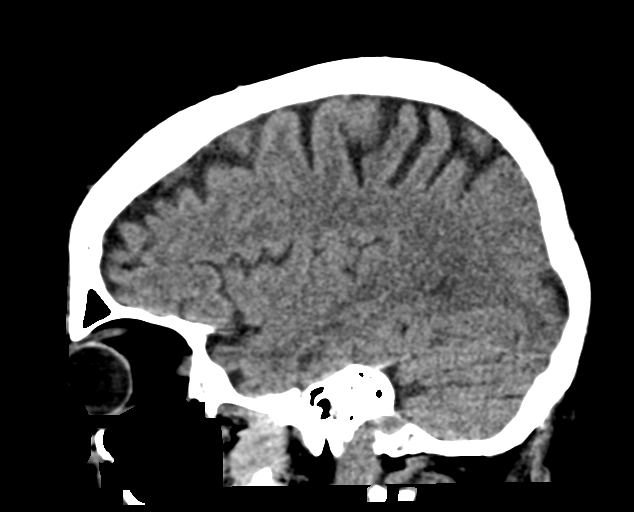
[im 29/57  brain]
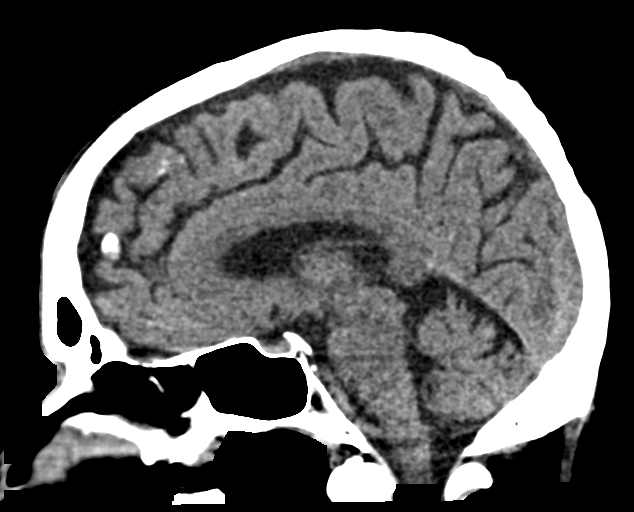
[im 38/57  brain]
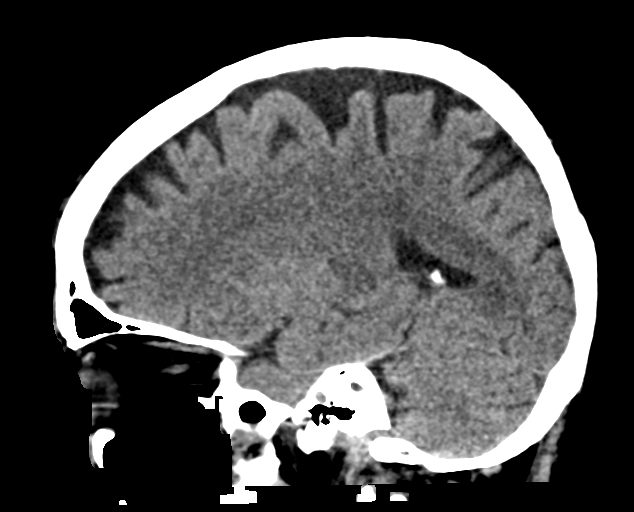

[16 of 47 positions shown; findings below may reference images not displayed]

FINDINGS: Brain: No evidence of acute infarction, hemorrhage, hydrocephalus,
extra-axial collection or mass lesion/mass effect.

Vascular: No hyperdense vessel or unexpected calcification.

Skull: Normal. Negative for fracture or focal lesion.

Sinuses/Orbits: No acute finding.  Bilateral cataract resection.
IMPRESSION: Negative head CT.
